# Patient Record
Sex: Female | Born: 1960 | Race: White | Hispanic: No | Marital: Single | Smoking: Never smoker
Health system: Southern US, Community
[De-identification: ages and names within clinical notes are randomized; demographics above are authoritative.]

## PROBLEM LIST (undated history)

## (undated) DIAGNOSIS — L97909 Non-pressure chronic ulcer of unspecified part of unspecified lower leg with unspecified severity: Secondary | ICD-10-CM

## (undated) DIAGNOSIS — I10 Essential (primary) hypertension: Secondary | ICD-10-CM

## (undated) DIAGNOSIS — D509 Iron deficiency anemia, unspecified: Secondary | ICD-10-CM

## (undated) DIAGNOSIS — I83009 Varicose veins of unspecified lower extremity with ulcer of unspecified site: Secondary | ICD-10-CM

## (undated) DIAGNOSIS — R053 Chronic cough: Secondary | ICD-10-CM

## (undated) DIAGNOSIS — Z8659 Personal history of other mental and behavioral disorders: Secondary | ICD-10-CM

## (undated) DIAGNOSIS — I5030 Unspecified diastolic (congestive) heart failure: Secondary | ICD-10-CM

## (undated) DIAGNOSIS — I509 Heart failure, unspecified: Secondary | ICD-10-CM

## (undated) DIAGNOSIS — G4733 Obstructive sleep apnea (adult) (pediatric): Secondary | ICD-10-CM

## (undated) DIAGNOSIS — R05 Cough: Secondary | ICD-10-CM

## (undated) DIAGNOSIS — R4189 Other symptoms and signs involving cognitive functions and awareness: Secondary | ICD-10-CM

## (undated) DIAGNOSIS — N289 Disorder of kidney and ureter, unspecified: Secondary | ICD-10-CM

## (undated) DIAGNOSIS — F329 Major depressive disorder, single episode, unspecified: Secondary | ICD-10-CM

## (undated) DIAGNOSIS — N1832 Chronic kidney disease, stage 3b: Secondary | ICD-10-CM

## (undated) DIAGNOSIS — E785 Hyperlipidemia, unspecified: Secondary | ICD-10-CM

## (undated) DIAGNOSIS — K219 Gastro-esophageal reflux disease without esophagitis: Secondary | ICD-10-CM

## (undated) DIAGNOSIS — F32A Depression, unspecified: Secondary | ICD-10-CM

## (undated) DIAGNOSIS — I272 Pulmonary hypertension, unspecified: Secondary | ICD-10-CM

## (undated) DIAGNOSIS — I2781 Cor pulmonale (chronic): Secondary | ICD-10-CM

## (undated) DIAGNOSIS — J449 Chronic obstructive pulmonary disease, unspecified: Secondary | ICD-10-CM

## (undated) DIAGNOSIS — J984 Other disorders of lung: Secondary | ICD-10-CM

## (undated) DIAGNOSIS — R0602 Shortness of breath: Secondary | ICD-10-CM

## (undated) DIAGNOSIS — B009 Herpesviral infection, unspecified: Secondary | ICD-10-CM

## (undated) HISTORY — DX: Other disorders of lung: J98.4

## (undated) HISTORY — DX: Iron deficiency anemia, unspecified: D50.9

## (undated) HISTORY — DX: Personal history of other mental and behavioral disorders: Z86.59

## (undated) HISTORY — PX: CHOLECYSTECTOMY: SHX55

## (undated) HISTORY — DX: Hyperlipidemia, unspecified: E78.5

## (undated) HISTORY — DX: Cough: R05

## (undated) HISTORY — DX: Unspecified diastolic (congestive) heart failure: I50.30

## (undated) HISTORY — DX: Chronic kidney disease, stage 3b: N18.32

## (undated) HISTORY — DX: Depression, unspecified: F32.A

## (undated) HISTORY — DX: Varicose veins of unspecified lower extremity with ulcer of unspecified site: L97.909

## (undated) HISTORY — DX: Major depressive disorder, single episode, unspecified: F32.9

## (undated) HISTORY — DX: Chronic cough: R05.3

## (undated) HISTORY — DX: Cor pulmonale (chronic): I27.81

## (undated) HISTORY — DX: Obstructive sleep apnea (adult) (pediatric): G47.33

## (undated) HISTORY — DX: Varicose veins of unspecified lower extremity with ulcer of unspecified site: I83.009

## (undated) HISTORY — DX: Gastro-esophageal reflux disease without esophagitis: K21.9

---

## 1997-04-06 ENCOUNTER — Emergency Department (HOSPITAL_COMMUNITY): Admission: EM | Admit: 1997-04-06 | Discharge: 1997-04-06 | Payer: Self-pay | Admitting: Emergency Medicine

## 1997-05-10 ENCOUNTER — Encounter: Admission: RE | Admit: 1997-05-10 | Discharge: 1997-05-10 | Payer: Self-pay | Admitting: Internal Medicine

## 1997-08-21 ENCOUNTER — Encounter: Admission: RE | Admit: 1997-08-21 | Discharge: 1997-08-21 | Payer: Self-pay | Admitting: Internal Medicine

## 1997-09-13 ENCOUNTER — Emergency Department (HOSPITAL_COMMUNITY): Admission: EM | Admit: 1997-09-13 | Discharge: 1997-09-13 | Payer: Self-pay | Admitting: Emergency Medicine

## 1997-10-16 ENCOUNTER — Encounter: Admission: RE | Admit: 1997-10-16 | Discharge: 1997-10-16 | Payer: Self-pay | Admitting: Internal Medicine

## 1997-10-16 ENCOUNTER — Ambulatory Visit (HOSPITAL_COMMUNITY): Admission: RE | Admit: 1997-10-16 | Discharge: 1997-10-16 | Payer: Self-pay | Admitting: Internal Medicine

## 1997-11-07 ENCOUNTER — Encounter: Admission: RE | Admit: 1997-11-07 | Discharge: 1997-11-07 | Payer: Self-pay | Admitting: Internal Medicine

## 1998-02-10 ENCOUNTER — Encounter: Admission: RE | Admit: 1998-02-10 | Discharge: 1998-02-10 | Payer: Self-pay | Admitting: Internal Medicine

## 1998-03-20 ENCOUNTER — Encounter: Admission: RE | Admit: 1998-03-20 | Discharge: 1998-03-20 | Payer: Self-pay | Admitting: Internal Medicine

## 1998-03-20 ENCOUNTER — Other Ambulatory Visit: Admission: RE | Admit: 1998-03-20 | Discharge: 1998-03-20 | Payer: Self-pay | Admitting: Internal Medicine

## 1998-04-18 ENCOUNTER — Emergency Department (HOSPITAL_COMMUNITY): Admission: EM | Admit: 1998-04-18 | Discharge: 1998-04-18 | Payer: Self-pay | Admitting: Emergency Medicine

## 1998-04-18 ENCOUNTER — Encounter: Payer: Self-pay | Admitting: Emergency Medicine

## 1998-05-14 ENCOUNTER — Encounter: Admission: RE | Admit: 1998-05-14 | Discharge: 1998-05-14 | Payer: Self-pay | Admitting: Internal Medicine

## 1998-06-20 ENCOUNTER — Emergency Department (HOSPITAL_COMMUNITY): Admission: EM | Admit: 1998-06-20 | Discharge: 1998-06-20 | Payer: Self-pay | Admitting: Emergency Medicine

## 1998-06-26 ENCOUNTER — Encounter: Admission: RE | Admit: 1998-06-26 | Discharge: 1998-06-26 | Payer: Self-pay | Admitting: Hematology and Oncology

## 1998-07-05 ENCOUNTER — Inpatient Hospital Stay (HOSPITAL_COMMUNITY): Admission: AD | Admit: 1998-07-05 | Discharge: 1998-07-05 | Payer: Self-pay | Admitting: *Deleted

## 1998-08-26 ENCOUNTER — Encounter: Admission: RE | Admit: 1998-08-26 | Discharge: 1998-08-26 | Payer: Self-pay | Admitting: Internal Medicine

## 1998-09-10 ENCOUNTER — Encounter: Admission: RE | Admit: 1998-09-10 | Discharge: 1998-09-10 | Payer: Self-pay | Admitting: Internal Medicine

## 1998-10-21 ENCOUNTER — Encounter: Admission: RE | Admit: 1998-10-21 | Discharge: 1998-10-21 | Payer: Self-pay | Admitting: Internal Medicine

## 1999-02-06 ENCOUNTER — Encounter: Admission: RE | Admit: 1999-02-06 | Discharge: 1999-02-06 | Payer: Self-pay | Admitting: Internal Medicine

## 1999-04-14 ENCOUNTER — Emergency Department (HOSPITAL_COMMUNITY): Admission: EM | Admit: 1999-04-14 | Discharge: 1999-04-14 | Payer: Self-pay | Admitting: Emergency Medicine

## 1999-04-14 ENCOUNTER — Encounter: Payer: Self-pay | Admitting: Emergency Medicine

## 1999-06-04 ENCOUNTER — Encounter: Admission: RE | Admit: 1999-06-04 | Discharge: 1999-06-04 | Payer: Self-pay | Admitting: Internal Medicine

## 1999-06-23 ENCOUNTER — Encounter: Admission: RE | Admit: 1999-06-23 | Discharge: 1999-06-23 | Payer: Self-pay | Admitting: Internal Medicine

## 1999-12-16 ENCOUNTER — Encounter: Admission: RE | Admit: 1999-12-16 | Discharge: 1999-12-16 | Payer: Self-pay | Admitting: Internal Medicine

## 2000-01-26 ENCOUNTER — Encounter: Admission: RE | Admit: 2000-01-26 | Discharge: 2000-01-26 | Payer: Self-pay | Admitting: Internal Medicine

## 2000-01-26 ENCOUNTER — Ambulatory Visit (HOSPITAL_COMMUNITY): Admission: RE | Admit: 2000-01-26 | Discharge: 2000-01-26 | Payer: Self-pay | Admitting: Internal Medicine

## 2000-09-13 ENCOUNTER — Other Ambulatory Visit: Admission: RE | Admit: 2000-09-13 | Discharge: 2000-09-13 | Payer: Self-pay | Admitting: Internal Medicine

## 2000-09-13 ENCOUNTER — Encounter: Admission: RE | Admit: 2000-09-13 | Discharge: 2000-09-13 | Payer: Self-pay | Admitting: Internal Medicine

## 2000-12-26 ENCOUNTER — Emergency Department (HOSPITAL_COMMUNITY): Admission: EM | Admit: 2000-12-26 | Discharge: 2000-12-26 | Payer: Self-pay | Admitting: Emergency Medicine

## 2001-01-11 ENCOUNTER — Encounter: Admission: RE | Admit: 2001-01-11 | Discharge: 2001-01-11 | Payer: Self-pay

## 2001-03-14 ENCOUNTER — Encounter: Admission: RE | Admit: 2001-03-14 | Discharge: 2001-03-14 | Payer: Self-pay | Admitting: Internal Medicine

## 2001-03-20 ENCOUNTER — Encounter: Admission: RE | Admit: 2001-03-20 | Discharge: 2001-03-20 | Payer: Self-pay | Admitting: Internal Medicine

## 2001-05-23 ENCOUNTER — Encounter: Admission: RE | Admit: 2001-05-23 | Discharge: 2001-05-23 | Payer: Self-pay | Admitting: Internal Medicine

## 2001-08-01 ENCOUNTER — Encounter: Admission: RE | Admit: 2001-08-01 | Discharge: 2001-08-01 | Payer: Self-pay | Admitting: Internal Medicine

## 2001-10-04 ENCOUNTER — Inpatient Hospital Stay (HOSPITAL_COMMUNITY): Admission: AD | Admit: 2001-10-04 | Discharge: 2001-10-04 | Payer: Self-pay | Admitting: Obstetrics and Gynecology

## 2001-12-04 ENCOUNTER — Encounter: Admission: RE | Admit: 2001-12-04 | Discharge: 2001-12-04 | Payer: Self-pay | Admitting: Internal Medicine

## 2001-12-05 ENCOUNTER — Encounter: Admission: RE | Admit: 2001-12-05 | Discharge: 2001-12-05 | Payer: Self-pay | Admitting: Internal Medicine

## 2001-12-19 ENCOUNTER — Encounter: Admission: RE | Admit: 2001-12-19 | Discharge: 2001-12-19 | Payer: Self-pay | Admitting: Internal Medicine

## 2002-02-07 ENCOUNTER — Encounter: Admission: RE | Admit: 2002-02-07 | Discharge: 2002-02-07 | Payer: Self-pay | Admitting: Internal Medicine

## 2002-02-28 ENCOUNTER — Encounter: Admission: RE | Admit: 2002-02-28 | Discharge: 2002-02-28 | Payer: Self-pay | Admitting: Internal Medicine

## 2002-05-08 ENCOUNTER — Encounter: Admission: RE | Admit: 2002-05-08 | Discharge: 2002-05-08 | Payer: Self-pay | Admitting: Internal Medicine

## 2002-06-19 ENCOUNTER — Encounter: Admission: RE | Admit: 2002-06-19 | Discharge: 2002-06-19 | Payer: Self-pay | Admitting: Internal Medicine

## 2002-06-22 ENCOUNTER — Emergency Department (HOSPITAL_COMMUNITY): Admission: EM | Admit: 2002-06-22 | Discharge: 2002-06-22 | Payer: Self-pay | Admitting: Emergency Medicine

## 2002-06-22 ENCOUNTER — Encounter: Payer: Self-pay | Admitting: Emergency Medicine

## 2002-08-21 ENCOUNTER — Encounter: Admission: RE | Admit: 2002-08-21 | Discharge: 2002-08-21 | Payer: Self-pay | Admitting: Internal Medicine

## 2002-10-23 ENCOUNTER — Other Ambulatory Visit: Admission: RE | Admit: 2002-10-23 | Discharge: 2002-10-23 | Payer: Self-pay | Admitting: Internal Medicine

## 2002-10-23 ENCOUNTER — Encounter: Admission: RE | Admit: 2002-10-23 | Discharge: 2002-10-23 | Payer: Self-pay | Admitting: Internal Medicine

## 2002-12-18 ENCOUNTER — Encounter: Admission: RE | Admit: 2002-12-18 | Discharge: 2002-12-18 | Payer: Self-pay | Admitting: Internal Medicine

## 2002-12-25 ENCOUNTER — Ambulatory Visit (HOSPITAL_COMMUNITY): Admission: RE | Admit: 2002-12-25 | Discharge: 2002-12-25 | Payer: Self-pay | Admitting: Internal Medicine

## 2003-01-15 ENCOUNTER — Encounter: Admission: RE | Admit: 2003-01-15 | Discharge: 2003-01-15 | Payer: Self-pay | Admitting: Internal Medicine

## 2003-02-14 ENCOUNTER — Encounter: Admission: RE | Admit: 2003-02-14 | Discharge: 2003-02-14 | Payer: Self-pay | Admitting: Internal Medicine

## 2003-03-14 ENCOUNTER — Encounter: Admission: RE | Admit: 2003-03-14 | Discharge: 2003-03-14 | Payer: Self-pay | Admitting: Internal Medicine

## 2003-04-30 ENCOUNTER — Encounter: Admission: RE | Admit: 2003-04-30 | Discharge: 2003-04-30 | Payer: Self-pay | Admitting: Internal Medicine

## 2003-05-28 ENCOUNTER — Encounter: Admission: RE | Admit: 2003-05-28 | Discharge: 2003-05-28 | Payer: Self-pay | Admitting: Internal Medicine

## 2003-06-27 ENCOUNTER — Encounter: Admission: RE | Admit: 2003-06-27 | Discharge: 2003-06-27 | Payer: Self-pay | Admitting: Internal Medicine

## 2003-07-26 ENCOUNTER — Encounter: Admission: RE | Admit: 2003-07-26 | Discharge: 2003-07-26 | Payer: Self-pay | Admitting: Internal Medicine

## 2003-08-26 ENCOUNTER — Encounter: Admission: RE | Admit: 2003-08-26 | Discharge: 2003-08-26 | Payer: Self-pay | Admitting: Internal Medicine

## 2003-09-26 ENCOUNTER — Ambulatory Visit: Payer: Self-pay | Admitting: Internal Medicine

## 2003-10-28 ENCOUNTER — Ambulatory Visit: Payer: Self-pay | Admitting: Internal Medicine

## 2003-12-05 ENCOUNTER — Ambulatory Visit: Payer: Self-pay | Admitting: Internal Medicine

## 2004-01-14 ENCOUNTER — Ambulatory Visit: Payer: Self-pay | Admitting: Internal Medicine

## 2004-01-28 ENCOUNTER — Ambulatory Visit (HOSPITAL_COMMUNITY): Admission: RE | Admit: 2004-01-28 | Discharge: 2004-01-28 | Payer: Self-pay | Admitting: Internal Medicine

## 2004-02-11 ENCOUNTER — Ambulatory Visit: Payer: Self-pay | Admitting: Internal Medicine

## 2004-03-31 ENCOUNTER — Ambulatory Visit: Payer: Self-pay | Admitting: Internal Medicine

## 2004-03-31 ENCOUNTER — Other Ambulatory Visit: Admission: RE | Admit: 2004-03-31 | Discharge: 2004-03-31 | Payer: Self-pay | Admitting: Internal Medicine

## 2004-04-30 ENCOUNTER — Ambulatory Visit: Payer: Self-pay | Admitting: Internal Medicine

## 2004-05-19 ENCOUNTER — Inpatient Hospital Stay (HOSPITAL_COMMUNITY): Admission: AD | Admit: 2004-05-19 | Discharge: 2004-05-19 | Payer: Self-pay | Admitting: *Deleted

## 2004-05-28 ENCOUNTER — Ambulatory Visit: Payer: Self-pay | Admitting: Internal Medicine

## 2004-06-29 ENCOUNTER — Ambulatory Visit: Payer: Self-pay | Admitting: Internal Medicine

## 2004-07-29 ENCOUNTER — Ambulatory Visit: Payer: Self-pay | Admitting: Internal Medicine

## 2004-08-28 ENCOUNTER — Ambulatory Visit: Payer: Self-pay | Admitting: Internal Medicine

## 2004-09-29 ENCOUNTER — Ambulatory Visit: Payer: Self-pay | Admitting: Internal Medicine

## 2004-10-27 ENCOUNTER — Ambulatory Visit: Payer: Self-pay | Admitting: Internal Medicine

## 2004-12-01 ENCOUNTER — Ambulatory Visit: Payer: Self-pay | Admitting: Internal Medicine

## 2004-12-31 ENCOUNTER — Ambulatory Visit: Payer: Self-pay | Admitting: Internal Medicine

## 2005-01-28 ENCOUNTER — Ambulatory Visit (HOSPITAL_COMMUNITY): Admission: RE | Admit: 2005-01-28 | Discharge: 2005-01-28 | Payer: Self-pay | Admitting: Internal Medicine

## 2005-02-01 ENCOUNTER — Ambulatory Visit: Payer: Self-pay | Admitting: Internal Medicine

## 2005-02-23 ENCOUNTER — Ambulatory Visit: Payer: Self-pay | Admitting: Internal Medicine

## 2005-06-04 ENCOUNTER — Ambulatory Visit: Payer: Self-pay | Admitting: Internal Medicine

## 2005-06-22 ENCOUNTER — Ambulatory Visit: Payer: Self-pay | Admitting: Internal Medicine

## 2005-06-24 ENCOUNTER — Ambulatory Visit (HOSPITAL_COMMUNITY): Admission: RE | Admit: 2005-06-24 | Discharge: 2005-06-24 | Payer: Self-pay | Admitting: Internal Medicine

## 2005-06-25 ENCOUNTER — Encounter (INDEPENDENT_AMBULATORY_CARE_PROVIDER_SITE_OTHER): Payer: Self-pay | Admitting: Internal Medicine

## 2005-07-22 ENCOUNTER — Ambulatory Visit: Payer: Self-pay | Admitting: Internal Medicine

## 2005-08-31 ENCOUNTER — Ambulatory Visit: Payer: Self-pay | Admitting: Hospitalist

## 2005-09-30 ENCOUNTER — Ambulatory Visit: Payer: Self-pay | Admitting: Hospitalist

## 2005-10-19 DIAGNOSIS — F79 Unspecified intellectual disabilities: Secondary | ICD-10-CM

## 2005-10-19 DIAGNOSIS — D518 Other vitamin B12 deficiency anemias: Secondary | ICD-10-CM

## 2005-11-01 ENCOUNTER — Ambulatory Visit: Payer: Self-pay | Admitting: Internal Medicine

## 2005-11-29 DIAGNOSIS — K219 Gastro-esophageal reflux disease without esophagitis: Secondary | ICD-10-CM | POA: Insufficient documentation

## 2005-12-03 ENCOUNTER — Ambulatory Visit: Payer: Self-pay | Admitting: Internal Medicine

## 2005-12-21 ENCOUNTER — Ambulatory Visit: Payer: Self-pay | Admitting: Internal Medicine

## 2005-12-21 LAB — CONVERTED CEMR LAB
HCT: 42.5 % (ref 34.4–43.3)
MCHC: 31.3 g/dL — ABNORMAL LOW (ref 33.1–35.4)
RBC: 4.74 M/uL (ref 3.79–4.96)
WBC: 13 10*3/uL — ABNORMAL HIGH (ref 3.7–10.0)

## 2006-02-07 ENCOUNTER — Ambulatory Visit (HOSPITAL_COMMUNITY): Admission: RE | Admit: 2006-02-07 | Discharge: 2006-02-07 | Payer: Self-pay | Admitting: Internal Medicine

## 2006-02-09 ENCOUNTER — Ambulatory Visit: Payer: Self-pay | Admitting: Internal Medicine

## 2006-04-26 ENCOUNTER — Ambulatory Visit: Payer: Self-pay | Admitting: Internal Medicine

## 2006-05-04 ENCOUNTER — Telehealth (INDEPENDENT_AMBULATORY_CARE_PROVIDER_SITE_OTHER): Payer: Self-pay | Admitting: *Deleted

## 2006-06-28 ENCOUNTER — Ambulatory Visit: Payer: Self-pay | Admitting: Internal Medicine

## 2006-06-28 ENCOUNTER — Encounter (INDEPENDENT_AMBULATORY_CARE_PROVIDER_SITE_OTHER): Payer: Self-pay | Admitting: Internal Medicine

## 2006-06-28 LAB — CONVERTED CEMR LAB
BUN: 10 mg/dL (ref 6–23)
Basophils Absolute: 0 10*3/uL (ref 0.0–0.1)
Basophils Relative: 0 % (ref 0–1)
Creatinine, Ser: 0.69 mg/dL (ref 0.40–1.20)
Eosinophils Relative: 0 % (ref 0–5)
Glucose, Bld: 82 mg/dL (ref 70–99)
Lymphocytes Relative: 27 % (ref 12–46)
MCHC: 30.7 g/dL (ref 30.0–36.0)
MCV: 84.5 fL (ref 78.0–100.0)
Neutro Abs: 7.4 10*3/uL (ref 1.7–7.7)
Neutrophils Relative %: 62 % (ref 43–77)
Potassium: 4.2 meq/L (ref 3.5–5.3)
RDW: 17.8 % — ABNORMAL HIGH (ref 11.5–14.0)
Sodium: 139 meq/L (ref 135–145)
WBC: 11.8 10*3/uL — ABNORMAL HIGH (ref 4.0–10.5)

## 2006-08-10 ENCOUNTER — Ambulatory Visit: Payer: Self-pay | Admitting: Internal Medicine

## 2006-10-10 ENCOUNTER — Ambulatory Visit: Payer: Self-pay | Admitting: *Deleted

## 2006-11-11 ENCOUNTER — Ambulatory Visit: Payer: Self-pay | Admitting: Internal Medicine

## 2007-03-14 ENCOUNTER — Ambulatory Visit: Payer: Self-pay | Admitting: Internal Medicine

## 2007-03-14 LAB — CONVERTED CEMR LAB
ALT: 20 units/L (ref 0–35)
Albumin: 4 g/dL (ref 3.5–5.2)
BUN: 12 mg/dL (ref 6–23)
Calcium: 8.9 mg/dL (ref 8.4–10.5)
Chloride: 101 meq/L (ref 96–112)
HDL: 60 mg/dL (ref >39)
MCHC: 31 g/dL (ref 30.0–36.0)
MCV: 81.1 fL (ref 78.0–100.0)
Potassium: 4 meq/L (ref 3.5–5.3)
RBC: 4.81 M/uL (ref 3.87–5.11)
RDW: 18.3 % — ABNORMAL HIGH (ref 11.5–15.5)
Total Bilirubin: 0.3 mg/dL (ref 0.3–1.2)
Total CHOL/HDL Ratio: 3.3
VLDL: 26 mg/dL (ref 0–40)
Vitamin B-12: >2000 pg/mL — ABNORMAL HIGH (ref 211–911)
WBC: 17.9 10*3/uL — ABNORMAL HIGH (ref 4.0–10.5)

## 2007-03-15 ENCOUNTER — Telehealth: Payer: Self-pay | Admitting: *Deleted

## 2007-03-23 ENCOUNTER — Ambulatory Visit (HOSPITAL_COMMUNITY): Admission: RE | Admit: 2007-03-23 | Discharge: 2007-03-23 | Payer: Self-pay | Admitting: Internal Medicine

## 2007-05-03 ENCOUNTER — Encounter (INDEPENDENT_AMBULATORY_CARE_PROVIDER_SITE_OTHER): Payer: Self-pay | Admitting: Internal Medicine

## 2007-06-20 ENCOUNTER — Ambulatory Visit: Payer: Self-pay | Admitting: Internal Medicine

## 2007-08-07 ENCOUNTER — Telehealth: Payer: Self-pay | Admitting: *Deleted

## 2007-08-07 ENCOUNTER — Ambulatory Visit: Payer: Self-pay | Admitting: Infectious Diseases

## 2007-09-28 ENCOUNTER — Encounter (INDEPENDENT_AMBULATORY_CARE_PROVIDER_SITE_OTHER): Payer: Self-pay | Admitting: Internal Medicine

## 2007-09-28 ENCOUNTER — Ambulatory Visit: Payer: Self-pay | Admitting: Internal Medicine

## 2007-10-30 ENCOUNTER — Ambulatory Visit: Payer: Self-pay | Admitting: Internal Medicine

## 2007-10-30 ENCOUNTER — Encounter (INDEPENDENT_AMBULATORY_CARE_PROVIDER_SITE_OTHER): Payer: Self-pay | Admitting: Internal Medicine

## 2007-11-14 ENCOUNTER — Encounter (INDEPENDENT_AMBULATORY_CARE_PROVIDER_SITE_OTHER): Payer: Self-pay | Admitting: Internal Medicine

## 2007-11-14 ENCOUNTER — Ambulatory Visit: Payer: Self-pay | Admitting: Internal Medicine

## 2007-11-14 LAB — CONVERTED CEMR LAB
Alkaline Phosphatase: 100 units/L (ref 39–117)
Basophils Absolute: 0 10*3/uL (ref 0.0–0.1)
Bilirubin Urine: NEGATIVE
Calcium: 9.2 mg/dL (ref 8.4–10.5)
Chloride: 98 meq/L (ref 96–112)
Creatinine, Ser: 0.61 mg/dL (ref 0.40–1.20)
Glucose, Bld: 91 mg/dL (ref 70–99)
HCT: 37 % (ref 36.0–46.0)
Hemoglobin, Urine: NEGATIVE
Hemoglobin: 10.8 g/dL — ABNORMAL LOW (ref 12.0–15.0)
Ketones, ur: NEGATIVE mg/dL
Leukocytes, UA: NEGATIVE
MCHC: 29.2 g/dL — ABNORMAL LOW (ref 30.0–36.0)
MCV: 73.4 fL — ABNORMAL LOW (ref 78.0–100.0)
Monocytes Absolute: 1.2 10*3/uL — ABNORMAL HIGH (ref 0.1–1.0)
Neutro Abs: 11.3 10*3/uL — ABNORMAL HIGH (ref 1.7–7.7)
Neutrophils Relative %: 74 % (ref 43–77)
Platelets: 690 10*3/uL — ABNORMAL HIGH (ref 150–400)
Protein, ur: NEGATIVE mg/dL
RDW: 20.2 % — ABNORMAL HIGH (ref 11.5–15.5)
Sodium: 139 meq/L (ref 135–145)
Total Bilirubin: 0.3 mg/dL (ref 0.3–1.2)
Urine Glucose: NEGATIVE mg/dL
pH: 6 (ref 5.0–8.0)

## 2007-11-29 ENCOUNTER — Ambulatory Visit: Payer: Self-pay | Admitting: *Deleted

## 2007-12-13 ENCOUNTER — Telehealth: Payer: Self-pay | Admitting: *Deleted

## 2008-01-03 ENCOUNTER — Emergency Department (HOSPITAL_COMMUNITY): Admission: EM | Admit: 2008-01-03 | Discharge: 2008-01-03 | Payer: Self-pay | Admitting: Emergency Medicine

## 2008-01-03 ENCOUNTER — Telehealth: Payer: Self-pay | Admitting: Infectious Diseases

## 2008-01-09 ENCOUNTER — Ambulatory Visit: Payer: Self-pay | Admitting: Internal Medicine

## 2008-02-08 ENCOUNTER — Ambulatory Visit: Payer: Self-pay | Admitting: Internal Medicine

## 2008-02-08 LAB — CONVERTED CEMR LAB: Beta hcg, urine, semiquantitative: NEGATIVE

## 2008-02-09 LAB — CONVERTED CEMR LAB
Basophils Absolute: 0 10*3/uL (ref 0.0–0.1)
Basophils Relative: 0 % (ref 0–1)
Ferritin: 27 ng/mL (ref 10–291)
HCT: 38.5 % (ref 36.0–46.0)
Hemoglobin: 10.8 g/dL — ABNORMAL LOW (ref 12.0–15.0)
Lymphocytes Relative: 20 % (ref 12–46)
MCHC: 28.1 g/dL — ABNORMAL LOW (ref 30.0–36.0)
MCV: 73.6 fL — ABNORMAL LOW (ref 78.0–100.0)
Monocytes Relative: 7 % (ref 3–12)
Platelets: 640 10*3/uL — ABNORMAL HIGH (ref 150–400)
RBC: 5.23 M/uL — ABNORMAL HIGH (ref 3.87–5.11)
RDW: 22.1 % — ABNORMAL HIGH (ref 11.5–15.5)
TSH: 3.239 microintl units/mL (ref 0.350–4.50)

## 2008-02-26 ENCOUNTER — Ambulatory Visit: Payer: Self-pay | Admitting: Internal Medicine

## 2008-02-26 LAB — CONVERTED CEMR LAB
OCCULT 1: NEGATIVE
OCCULT 2: NEGATIVE

## 2008-03-11 ENCOUNTER — Ambulatory Visit: Payer: Self-pay | Admitting: *Deleted

## 2008-03-20 ENCOUNTER — Inpatient Hospital Stay (HOSPITAL_COMMUNITY): Admission: EM | Admit: 2008-03-20 | Discharge: 2008-03-25 | Payer: Self-pay | Admitting: Emergency Medicine

## 2008-03-20 ENCOUNTER — Encounter: Payer: Self-pay | Admitting: *Deleted

## 2008-03-20 ENCOUNTER — Ambulatory Visit: Payer: Self-pay | Admitting: Internal Medicine

## 2008-03-21 ENCOUNTER — Ambulatory Visit: Payer: Self-pay | Admitting: Vascular Surgery

## 2008-03-21 ENCOUNTER — Encounter (INDEPENDENT_AMBULATORY_CARE_PROVIDER_SITE_OTHER): Payer: Self-pay | Admitting: Internal Medicine

## 2008-03-21 ENCOUNTER — Encounter (INDEPENDENT_AMBULATORY_CARE_PROVIDER_SITE_OTHER): Payer: Self-pay | Admitting: *Deleted

## 2008-03-25 ENCOUNTER — Encounter (INDEPENDENT_AMBULATORY_CARE_PROVIDER_SITE_OTHER): Payer: Self-pay | Admitting: Internal Medicine

## 2008-04-10 ENCOUNTER — Ambulatory Visit: Payer: Self-pay | Admitting: Internal Medicine

## 2008-04-17 ENCOUNTER — Ambulatory Visit (HOSPITAL_COMMUNITY): Admission: RE | Admit: 2008-04-17 | Discharge: 2008-04-17 | Payer: Self-pay | Admitting: Ophthalmology

## 2008-04-17 ENCOUNTER — Encounter (INDEPENDENT_AMBULATORY_CARE_PROVIDER_SITE_OTHER): Payer: Self-pay | Admitting: Internal Medicine

## 2008-05-14 ENCOUNTER — Ambulatory Visit: Payer: Self-pay | Admitting: *Deleted

## 2008-05-28 ENCOUNTER — Emergency Department (HOSPITAL_COMMUNITY): Admission: EM | Admit: 2008-05-28 | Discharge: 2008-05-28 | Payer: Self-pay | Admitting: Emergency Medicine

## 2008-05-29 ENCOUNTER — Telehealth: Payer: Self-pay | Admitting: *Deleted

## 2008-05-30 ENCOUNTER — Encounter (INDEPENDENT_AMBULATORY_CARE_PROVIDER_SITE_OTHER): Payer: Self-pay | Admitting: *Deleted

## 2008-05-30 ENCOUNTER — Ambulatory Visit: Payer: Self-pay | Admitting: *Deleted

## 2008-05-30 LAB — CONVERTED CEMR LAB
AST: 27 units/L (ref 0–37)
Albumin: 3.9 g/dL (ref 3.5–5.2)
Alkaline Phosphatase: 96 units/L (ref 39–117)
BUN: 8 mg/dL (ref 6–23)
Chloride: 101 meq/L (ref 96–112)
GFR calc Af Amer: >60 mL/min (ref >60)
GFR calc non Af Amer: >60 mL/min (ref >60)
Total Protein: 7.4 g/dL (ref 6.0–8.3)

## 2008-06-14 ENCOUNTER — Encounter (INDEPENDENT_AMBULATORY_CARE_PROVIDER_SITE_OTHER): Payer: Self-pay | Admitting: *Deleted

## 2008-06-14 ENCOUNTER — Ambulatory Visit: Payer: Self-pay | Admitting: Internal Medicine

## 2008-06-16 LAB — CONVERTED CEMR LAB
Calcium: 9.1 mg/dL (ref 8.4–10.5)
Chloride: 98 meq/L (ref 96–112)
Potassium: 4.1 meq/L (ref 3.5–5.3)
Total Protein: 7.1 g/dL (ref 6.0–8.3)

## 2008-06-27 ENCOUNTER — Telehealth: Payer: Self-pay | Admitting: *Deleted

## 2008-07-15 ENCOUNTER — Ambulatory Visit: Payer: Self-pay | Admitting: Internal Medicine

## 2008-07-15 ENCOUNTER — Encounter: Payer: Self-pay | Admitting: Internal Medicine

## 2008-07-15 DIAGNOSIS — I83009 Varicose veins of unspecified lower extremity with ulcer of unspecified site: Secondary | ICD-10-CM

## 2008-07-15 DIAGNOSIS — L97909 Non-pressure chronic ulcer of unspecified part of unspecified lower leg with unspecified severity: Secondary | ICD-10-CM | POA: Insufficient documentation

## 2008-07-18 LAB — CONVERTED CEMR LAB
Calcium: 9.1 mg/dL (ref 8.4–10.5)
Chloride: 100 meq/L (ref 96–112)
Potassium: 4.7 meq/L (ref 3.5–5.3)
Sodium: 141 meq/L (ref 135–145)

## 2008-07-22 ENCOUNTER — Encounter (INDEPENDENT_AMBULATORY_CARE_PROVIDER_SITE_OTHER): Payer: Self-pay | Admitting: Internal Medicine

## 2008-07-22 ENCOUNTER — Ambulatory Visit: Payer: Self-pay | Admitting: Infectious Diseases

## 2008-07-24 LAB — CONVERTED CEMR LAB
HCT: 39.6 % (ref 36.0–46.0)
Hemoglobin: 12.5 g/dL (ref 12.0–15.0)
LDL Cholesterol: 125 mg/dL — ABNORMAL HIGH (ref 0–99)
MCHC: 31.7 g/dL (ref 30.0–36.0)
MCV: 85.8 fL (ref 78.0–100.0)
RDW: 18.1 % — ABNORMAL HIGH (ref 11.5–15.5)
Total CHOL/HDL Ratio: 4.2
WBC: 11.4 10*3/uL — ABNORMAL HIGH (ref 4.0–10.5)

## 2008-08-08 ENCOUNTER — Encounter (INDEPENDENT_AMBULATORY_CARE_PROVIDER_SITE_OTHER): Payer: Self-pay | Admitting: Internal Medicine

## 2008-08-08 ENCOUNTER — Ambulatory Visit: Payer: Self-pay | Admitting: Internal Medicine

## 2008-08-13 ENCOUNTER — Ambulatory Visit: Payer: Self-pay | Admitting: Internal Medicine

## 2008-08-14 ENCOUNTER — Encounter (HOSPITAL_BASED_OUTPATIENT_CLINIC_OR_DEPARTMENT_OTHER): Admission: RE | Admit: 2008-08-14 | Discharge: 2008-09-11 | Payer: Self-pay | Admitting: Internal Medicine

## 2008-08-14 ENCOUNTER — Ambulatory Visit: Payer: Self-pay | Admitting: Internal Medicine

## 2008-09-04 ENCOUNTER — Inpatient Hospital Stay (HOSPITAL_COMMUNITY): Admission: AD | Admit: 2008-09-04 | Discharge: 2008-09-04 | Payer: Self-pay | Admitting: Obstetrics & Gynecology

## 2008-09-13 ENCOUNTER — Ambulatory Visit: Payer: Self-pay | Admitting: Infectious Diseases

## 2008-09-17 ENCOUNTER — Ambulatory Visit: Payer: Self-pay | Admitting: Internal Medicine

## 2008-09-23 ENCOUNTER — Telehealth (INDEPENDENT_AMBULATORY_CARE_PROVIDER_SITE_OTHER): Payer: Self-pay | Admitting: Internal Medicine

## 2008-09-27 ENCOUNTER — Telehealth: Payer: Self-pay | Admitting: *Deleted

## 2008-10-11 ENCOUNTER — Ambulatory Visit: Payer: Self-pay | Admitting: Internal Medicine

## 2008-10-15 ENCOUNTER — Ambulatory Visit: Payer: Self-pay | Admitting: Internal Medicine

## 2008-10-21 ENCOUNTER — Ambulatory Visit: Payer: Self-pay | Admitting: Internal Medicine

## 2008-11-12 ENCOUNTER — Ambulatory Visit: Payer: Self-pay | Admitting: Infectious Disease

## 2008-11-19 ENCOUNTER — Telehealth (INDEPENDENT_AMBULATORY_CARE_PROVIDER_SITE_OTHER): Payer: Self-pay | Admitting: Internal Medicine

## 2008-11-26 ENCOUNTER — Ambulatory Visit (HOSPITAL_BASED_OUTPATIENT_CLINIC_OR_DEPARTMENT_OTHER): Admission: RE | Admit: 2008-11-26 | Discharge: 2008-11-26 | Payer: Self-pay | Admitting: Internal Medicine

## 2008-11-26 ENCOUNTER — Encounter (INDEPENDENT_AMBULATORY_CARE_PROVIDER_SITE_OTHER): Payer: Self-pay | Admitting: Internal Medicine

## 2008-11-30 ENCOUNTER — Ambulatory Visit: Payer: Self-pay | Admitting: Internal Medicine

## 2008-12-16 ENCOUNTER — Telehealth (INDEPENDENT_AMBULATORY_CARE_PROVIDER_SITE_OTHER): Payer: Self-pay | Admitting: Internal Medicine

## 2008-12-17 ENCOUNTER — Telehealth (INDEPENDENT_AMBULATORY_CARE_PROVIDER_SITE_OTHER): Payer: Self-pay | Admitting: Internal Medicine

## 2008-12-17 ENCOUNTER — Ambulatory Visit: Payer: Self-pay | Admitting: Internal Medicine

## 2009-01-20 ENCOUNTER — Telehealth (INDEPENDENT_AMBULATORY_CARE_PROVIDER_SITE_OTHER): Payer: Self-pay | Admitting: Internal Medicine

## 2009-01-21 ENCOUNTER — Ambulatory Visit: Payer: Self-pay | Admitting: Internal Medicine

## 2009-02-11 ENCOUNTER — Ambulatory Visit: Payer: Self-pay | Admitting: Internal Medicine

## 2009-02-21 ENCOUNTER — Ambulatory Visit: Payer: Self-pay | Admitting: Internal Medicine

## 2009-02-21 DIAGNOSIS — G4733 Obstructive sleep apnea (adult) (pediatric): Secondary | ICD-10-CM

## 2009-02-25 ENCOUNTER — Encounter (INDEPENDENT_AMBULATORY_CARE_PROVIDER_SITE_OTHER): Payer: Self-pay | Admitting: Internal Medicine

## 2009-02-28 ENCOUNTER — Ambulatory Visit (HOSPITAL_COMMUNITY): Admission: RE | Admit: 2009-02-28 | Discharge: 2009-02-28 | Payer: Self-pay | Admitting: Internal Medicine

## 2009-03-11 ENCOUNTER — Ambulatory Visit: Payer: Self-pay | Admitting: Internal Medicine

## 2009-03-21 ENCOUNTER — Encounter (INDEPENDENT_AMBULATORY_CARE_PROVIDER_SITE_OTHER): Payer: Self-pay | Admitting: Internal Medicine

## 2009-04-22 ENCOUNTER — Ambulatory Visit: Payer: Self-pay | Admitting: Internal Medicine

## 2009-04-24 ENCOUNTER — Telehealth: Payer: Self-pay | Admitting: *Deleted

## 2009-04-24 LAB — CONVERTED CEMR LAB
HCT: 44.3 % (ref 36.0–46.0)
Hemoglobin: 13.3 g/dL (ref 12.0–15.0)
MCV: 91.7 fL (ref >=78.0)
RBC: 4.83 M/uL (ref 3.87–5.11)
Vitamin B-12: >2000 pg/mL — ABNORMAL HIGH (ref 211–911)
WBC: 11.4 10*3/uL — ABNORMAL HIGH (ref 4.0–10.5)

## 2009-04-28 ENCOUNTER — Telehealth (INDEPENDENT_AMBULATORY_CARE_PROVIDER_SITE_OTHER): Payer: Self-pay | Admitting: Internal Medicine

## 2009-09-06 ENCOUNTER — Emergency Department (HOSPITAL_COMMUNITY): Admission: EM | Admit: 2009-09-06 | Discharge: 2009-09-06 | Payer: Self-pay | Admitting: Emergency Medicine

## 2009-09-09 ENCOUNTER — Telehealth: Payer: Self-pay | Admitting: Internal Medicine

## 2009-10-30 ENCOUNTER — Ambulatory Visit: Payer: Self-pay | Admitting: Internal Medicine

## 2009-10-31 LAB — CONVERTED CEMR LAB
HCT: 42.7 % (ref 36.0–46.0)
Hemoglobin: 13.3 g/dL (ref 12.0–15.0)
MCHC: 31.1 g/dL (ref 30.0–36.0)
MCV: 91.8 fL (ref >=78.0)
Platelets: 456 10*3/uL — ABNORMAL HIGH (ref 150–400)
RBC: 4.65 M/uL (ref 3.87–5.11)
RDW: 17.3 % — ABNORMAL HIGH (ref 11.5–15.5)
Vitamin B-12: 288 pg/mL (ref 211–911)
WBC: 11.6 10*3/uL — ABNORMAL HIGH (ref 4.0–10.5)

## 2009-11-03 ENCOUNTER — Telehealth: Payer: Self-pay | Admitting: Internal Medicine

## 2009-11-11 ENCOUNTER — Encounter: Payer: Self-pay | Admitting: Internal Medicine

## 2009-12-16 ENCOUNTER — Encounter: Payer: Self-pay | Admitting: Internal Medicine

## 2010-01-13 ENCOUNTER — Encounter: Payer: Self-pay | Admitting: Internal Medicine

## 2010-01-15 ENCOUNTER — Encounter: Payer: Self-pay | Admitting: Internal Medicine

## 2010-02-05 NOTE — Miscellaneous (Signed)
Summary: [facility]: Pap Device  [facility]: Pap Device   Imported By: Bonner Puna 03/26/2009 16:46:28  _____________________________________________________________________  External Attachment:    Type:   Image     Comment:   External Document

## 2010-02-05 NOTE — Assessment & Plan Note (Signed)
Summary: EXTREME M/O CLASS/CH   Allergies: 1)  ! Aspirin (Aspirin) 2)  ! Sulfa 3)  ! Codeine Patient attended a 1 hour Extreme Makeover: Lifestyle meeting today. The meeting included information about: cooking- a food demo, healthy eating (portion control, lower and healthy fat,  high fiber and more fruits and vegetables), weight loss tips, and how to start a walking program and how to use a pedometer to track walking progress .   Please ask patient what they learned at their next visit.   Thank you for the referral.

## 2010-02-05 NOTE — Progress Notes (Signed)
Summary: refill/ hla  Phone Note Refill Request Message from:  Fax from Pharmacy on January 20, 2009 4:44 PM  Refills Requested: Medication #1:  TESSALON PERLES 100 MG CAPS Take 1 tablet by mouth three times a day as needed   Last Refilled: 01/05/2009 Initial call taken by: Freddy Finner RN,  January 20, 2009 4:44 PM    Prescriptions: TESSALON PERLES 100 MG CAPS (BENZONATATE) Take 1 tablet by mouth three times a day as needed  #30 x 3   Entered and Authorized by:   Myrtis Ser MD   Signed by:   Myrtis Ser MD on 01/21/2009   Method used:   Electronically to        [facility] [phone]* (retail)       [address]       [place],   [address]       Ph: [phone]       Fax: [phone]   RxID:   [id number]

## 2010-02-05 NOTE — Progress Notes (Signed)
Summary: Refill/gh  Phone Note Refill Request Message from:  Fax from Pharmacy on April 28, 2009 11:48 AM  Refills Requested: Medication #1:  ALLEGRA-D 12 HOUR 60-120 MG  XR12H-TAB Take 1 tablet by mouth two times a day   Last Refilled: 04/11/2009  Method Requested: Electronic Initial call taken by: Sander Nephew RN,  April 28, 2009 11:48 AM    Prescriptions: ALLEGRA-D 12 HOUR 60-120 MG  XR12H-TAB (FEXOFENADINE-PSEUDOEPHEDRINE) Take 1 tablet by mouth two times a day  #60 x 3   Entered and Authorized by:   Myrtis Ser MD   Signed by:   Myrtis Ser MD on 04/29/2009   Method used:   Electronically to        [facility] [phone]* (retail)       [address]       [place], [place]  [address]       Ph: [phone]       Fax: [phone]   RxID:   [id number]   Appended Document: Refill/gh Fax from [facility] on back order. Sandrea Hammond, RN April 30, 2009 9:25 AM  Appended Document: Refill/gh She was aware of this at her last appointment and was going to try claritin instead.

## 2010-02-05 NOTE — Assessment & Plan Note (Signed)
Summary: EXTREME M/O CLASS/CH   Allergies: 1)  ! Aspirin (Aspirin) 2)  ! Sulfa 3)  ! Codeine Patient attended a 1 hour Extreme Makeover: Lifestyle meeting today. The meeting included information about: cooking- a food demo, healthy eating (portion control, lower and healthy fat,  high fiber and more fruits and vegetables), weight loss tips, moving our body more.   Please ask patient what they learned at their next visit.   Thank you for the referral.    Complete Medication List: 1)  Cyanocobalamin 1000 Mcg/ml Inj Soln (Cyanocobalamin) .... Inject 1022mg im every  month 2)  Omeprazole 20 Mg Cpdr (Omeprazole) .... Take 1 tablet by mouth two times a day 3)  Valtrex 500 Mg Tabs (Valacyclovir hcl) .... Take 1 tablet by mouth two times a day x5 days as needed 4)  Multivitamins Tabs (Multiple vitamin) .... Iron, calcium, folic acid 5)  Ferrous Gluconate Tabs (Ferrous gluconate tabs) .... Take 1 tablet by mouth once a day 6)  Allegra-d 12 Hour 60-120 Mg Xr12h-tab (Fexofenadine-pseudoephedrine) .... Take 1 tablet by mouth two times a day 7)  Monistat 7 2 % Crea (Miconazole nitrate) .... Apply to affected areas two times a day for 7 days. 8)  Tessalon Perles 100 Mg Caps (Benzonatate) .... Take 1 tablet by mouth three times a day as needed 9)  Proventil Hfa 108 (90 Base) Mcg/act Aers (Albuterol sulfate) .... 2 puffs as required, up to four times a day. 10)  Lasix 20 Mg Tabs (Furosemide) .... Take one (1) by mouth daily  Other Orders: No Charge Patient Arrived (NCPA0) (NCPA0)

## 2010-02-05 NOTE — Assessment & Plan Note (Signed)
Summary: ROUTINE CK/EST/VS   Vital Signs:  Patient profile:   50 year old female Height:      63 inches Weight:      310.3 pounds BMI:     55.17 Temp:     97.8 degrees F oral Pulse rate:   99 / minute BP sitting:   136 / 81  (right arm)  Vitals Entered By: Silverio Decamp NT II (February 21, 2009 10:08 AM) CC: TEST RESULTS/ NEED B12 SHOT/  Is Patient Diabetic? No Pain Assessment Patient in pain? yes     Location: shoulder and knees Intensity: 4 Type: aching Onset of pain  2 weeks Nutritional Status BMI of > 30 = obese  Does patient need assistance? Functional Status Self care Ambulation Normal   CC:  TEST RESULTS/ NEED B12 SHOT/ .  History of Present Illness: This is a 50 year old woman with past medical history outlined in this chart who is here for check up. She has had a sleep study which  was positive for OSA, but she has not gotten a CPAP machine.  She discribes significnat insomnia, daytime somnolent with frequent napping.  Her chronic cough is still present but somewhat improved.  Preventive Screening-Counseling & Management  Alcohol-Tobacco     Alcohol drinks/day: 0     Smoking Status: quit     Year Quit: as teenager   Caffeine-Diet-Exercise     Does Patient Exercise: no  Current Medications (verified): 1)  Cyanocobalamin 1000 Mcg/ml Inj Soln (Cyanocobalamin) .... Inject 1022mg Im Every  Month 2)  Omeprazole 20 Mg Cpdr (Omeprazole) .... Take 1 Tablet By Mouth Two Times A Day 3)  Valtrex 500 Mg Tabs (Valacyclovir Hcl) .... Take 1 Tablet By Mouth Two Times A Day X5 Days As Needed 4)  Multivitamins  Tabs (Multiple Vitamin) .... Iron, Calcium, Folic Acid 5)  Ferrous Gluconate   Tabs (Ferrous Gluconate Tabs) .... Take 1 Tablet By Mouth Once A Day 6)  Allegra-D 12 Hour 60-120 Mg  Xr12h-Tab (Fexofenadine-Pseudoephedrine) .... Take 1 Tablet By Mouth Two Times A Day 7)  Tessalon Perles 100 Mg Caps (Benzonatate) .... Take 1 Tablet By Mouth Three Times A Day As  Needed 8)  Lasix 20 Mg Tabs (Furosemide) .... Take One (1) By Mouth Daily  Allergies: 1)  ! Aspirin (Aspirin) 2)  ! Sulfa 3)  ! Codeine  Review of Systems       per hpi  Physical Exam  General:  alert and overweight-appearing.   Head:  normocephalic and atraumatic.   Eyes:  vision grossly intact, pupils equal, pupils round, and pupils reactive to light.   Mouth:  good dentition, pharynx pink and moist, and no erythema.   Lungs:  normal respiratory effort and normal breath sounds.   Heart:  normal rate, regular rhythm, and no murmur.   Pulses:  +1 Extremities:  +1 edema Neurologic:  alert & oriented X3, cranial nerves II-XII intact, strength normal in all extremities, and gait normal.   Skin:  dark venous stasis changes over both LE's with some skin hypertophy.  no open lesions. Cervical Nodes:  no anterior cervical adenopathy and no posterior cervical adenopathy.   Psych:  Oriented X3, memory intact for recent and remote, normally interactive, and good eye contact.     Impression & Recommendations:  Problem # 1:  ANEMIA, B12 DEFICIENCY (ICD-281.1) B12 shot today.  Her updated medication list for this problem includes:    Cyanocobalamin 1000 Mcg/ml Inj Soln (Cyanocobalamin) ..... Inject  1085mg im every  month    Ferrous Gluconate Tabs (Ferrous gluconate tabs) ..Marland Kitchen.. Take 1 tablet by mouth once a day  Orders: Admin of Therapeutic Inj  intramuscular or subcutaneous ((20601 Vit B12 1000 mcg (J3420)  Problem # 2:  SLEEP APNEA, OBSTRUCTIVE (ICD-327.23) positive sleep study, will write for CPAP today. Will have her back in one month to see how she is adjusting to CPAP.  I think this could help her significantly with chronic fatiuge, irregular sleep habits, LE edema etc.  Problem # 3:  OBESITY (ICD-278.00) Very obese.  Hopefully CPAP and better sleep could helpwith energy and result in weight loss.  Problem # 4:  Preventive Health Care (ICD-V70.0) order mammogram  today.  Complete Medication List: 1)  Cyanocobalamin 1000 Mcg/ml Inj Soln (Cyanocobalamin) .... Inject 1002m im every  month 2)  Omeprazole 20 Mg Cpdr (Omeprazole) .... Take 1 tablet by mouth two times a day 3)  Valtrex 500 Mg Tabs (Valacyclovir hcl) .... Take 1 tablet by mouth two times a day x5 days as needed 4)  Multivitamins Tabs (Multiple vitamin) .... Iron, calcium, folic acid 5)  Ferrous Gluconate Tabs (Ferrous gluconate tabs) .... Take 1 tablet by mouth once a day 6)  Allegra-d 12 Hour 60-120 Mg Xr12h-tab (Fexofenadine-pseudoephedrine) .... Take 1 tablet by mouth two times a day 7)  Tessalon Perles 100 Mg Caps (Benzonatate) .... Take 1 tablet by mouth three times a day as needed 8)  Lasix 20 Mg Tabs (Furosemide) .... Take one (1) by mouth daily  Other Orders: Mammogram (Screening) (Mammo)  Patient Instructions: 1)  You will receive a CPAP machine from [facility]. 2)  Please schedule a follow-up appointment in 1 month to follow up on CPAP.    Prevention & Chronic Care Immunizations   Influenza vaccine: Not documented   Influenza vaccine deferral: Refused  (02/21/2009)    Tetanus booster: Not documented    Pneumococcal vaccine: Not documented  Other Screening   Pap smear: NEGATIVE FOR INTRAEPITHELIAL LESIONS OR MALIGNANCY.  (08/08/2008)    Mammogram: Assessment: BIRADS 1.   (03/23/2007)   Mammogram action/deferral: Ordered  (02/21/2009)   Mammogram due: 03/22/2009   Smoking status: quit  (02/21/2009)  Lipids   Total Cholesterol: 190  (07/22/2008)   Lipid panel action/deferral: Lipid Panel ordered   LDL: 125  (07/22/2008)   LDL Direct: Not documented   HDL: 45  (07/22/2008)   Triglycerides: 100  (07/22/2008)   Nursing Instructions: Schedule screening mammogram (see order)    Medication Administration  Injection # 1:    Medication: Vit B12 1000 mcg    Diagnosis: ANEMIA, B12 DEFICIENCY (ICD-281.1)    Route: IM    Site: R deltoid    Exp Date:  0750    Lot #: [id number]    Mfr: American Regent    Patient tolerated injection without complications    Given by: KaNadine CountsADeborra Medina(February 21, 2009 11:15 AM)  Orders Added: 1)  Admin of Therapeutic Inj  intramuscular or subcutaneous [96372] 2)  Vit B12 1000 mcg [J3420] 3)  Mammogram (Screening) [Mammo] 4)  Est. Patient Level III [[id number]]

## 2010-02-05 NOTE — Progress Notes (Signed)
Summary: phone/gg  Phone Note Call from Patient   Caller: Patient Summary of Call: Pt called and would like Dr Kelton Pillar to call her with the results of her lab work. Pt # [id number] Initial call taken by: Gevena Cotton RN,  November 03, 2009 11:53 AM  Follow-up for Phone Call        I called her and talked to her about the blood work Follow-up by: Lester [place] MD,  November 03, 2009 1:50 PM

## 2010-02-05 NOTE — Assessment & Plan Note (Signed)
Summary: B12 SHOT/CH  Nurse Visit   Allergies: 1)  ! Aspirin (Aspirin) 2)  ! Sulfa 3)  ! Codeine  Medication Administration  Injection # 1:    Medication: Vit B12 1000 mcg    Diagnosis: ANEMIA, B12 DEFICIENCY (ICD-281.1)    Route: IM    Site: R deltoid    Exp Date: 10/2010    Lot #: [id number]    Mfr: American Regent    Patient tolerated injection without complications    Given by: Sander Nephew RN (January 21, 2009 10:41 AM)  Orders Added: 1)  Vit B12 1000 mcg [J3420] 2)  Admin of Therapeutic Inj  intramuscular or subcutaneous [96372]   Medication Administration  Injection # 1:    Medication: Vit B12 1000 mcg    Diagnosis: ANEMIA, B12 DEFICIENCY (ICD-281.1)    Route: IM    Site: R deltoid    Exp Date: 10/2010    Lot #: [id number]    Mfr: American Regent    Patient tolerated injection without complications    Given by: Sander Nephew RN (January 21, 2009 10:41 AM)  Orders Added: 1)  Vit B12 1000 mcg [J3420] 2)  Admin of Therapeutic Inj  intramuscular or subcutaneous [[id number]]

## 2010-02-05 NOTE — Assessment & Plan Note (Signed)
Summary: EST-1 MONTH RECHECK/CH   Vital Signs:  Patient profile:   50 year old female Height:      63 inches (160.02 cm) Weight:      311.9 pounds (141.77 kg) BMI:     55.45 Temp:     97.3 degrees F oral Pulse rate:   90 / minute BP sitting:   130 / 80  (left arm)  Vitals Entered By: Morrison Old RN (April 22, 2009 10:35 AM) CC: F/U visit. B12 shot. Allergies flare-up; Allegra-D on backorder. Is Patient Diabetic? No Pain Assessment Patient in pain? yes     Location: left upper muscle pain Intensity: 7 Type: aching Onset of pain  Intermittent; esp. with movement Nutritional Status BMI of > 30 = obese  Have you ever been in a relationship where you felt threatened, hurt or afraid?No   Does patient need assistance? Functional Status Self care Ambulation Normal   CC:  F/U visit. B12 shot. Allergies flare-up; Allegra-D on backorder.Marland Kitchen  History of Present Illness: 50yof with pmh outlined in this chart here for check up.  She was here one month ago for cerumen impaction.  She is here for B12 injection.  She reports that she has been having increased swelling in her arms and legs recently, no shortness of breath, chest pain or decrease in exercise tolerance.  She also has allergic symptoms of runny nose, post nasal drip, cough and itchy eyes.  [facility] is out of allegra D so she is waiting for it to come it.  It usually works for her. Ear ringing has resolved.  Depression History:      The patient denies a depressed mood most of the day and a diminished interest in her usual daily activities.         Preventive Screening-Counseling & Management  Alcohol-Tobacco     Alcohol drinks/day: 0     Smoking Status: quit     Year Quit: as teenager   Caffeine-Diet-Exercise     Does Patient Exercise: no  Current Medications (verified): 1)  Cyanocobalamin 1000 Mcg/ml Inj Soln (Cyanocobalamin) .... Inject 1077mg Im Every  Month 2)  Omeprazole 20 Mg Cpdr (Omeprazole) .... Take 1 Tablet  By Mouth Two Times A Day 3)  Valtrex 500 Mg Tabs (Valacyclovir Hcl) .... Take 1 Tablet By Mouth Two Times A Day X5 Days As Needed 4)  Multivitamins  Tabs (Multiple Vitamin) .... Iron, Calcium, Folic Acid 5)  Ferrous Gluconate   Tabs (Ferrous Gluconate Tabs) .... Take 1 Tablet By Mouth Once A Day 6)  Allegra-D 12 Hour 60-120 Mg  Xr12h-Tab (Fexofenadine-Pseudoephedrine) .... Take 1 Tablet By Mouth Two Times A Day 7)  Lasix 20 Mg Tabs (Furosemide) .... Take One (1) By Mouth Daily  Allergies (verified): 1)  ! Aspirin (Aspirin) 2)  ! Sulfa 3)  ! Codeine  Review of Systems       per hpi  Physical Exam  General:  alert and overweight-appearing.   Head:  normocephalic and atraumatic.   Lungs:  normal respiratory effort and normal breath sounds.   Heart:  normal rate, regular rhythm, and no murmur.   Pulses:  +1 Extremities:  +1 edema Neurologic:  alert & oriented X3, cranial nerves II-XII intact, and gait normal.   Psych:  Oriented X3, memory intact for recent and remote, normally interactive, and slightly anxious.     Impression & Recommendations:  Problem # 1:  SLEEP APNEA, OBSTRUCTIVE (ICD-327.23) has CPAP machine, uses it and likes it.  Problem # 2:  ANEMIA, B12 DEFICIENCY (ICD-281.1)  Could probably stop B12 injections, as she has been getting them for years.  Check levels today and decide on further injections. Will give today's dose.  Her updated medication list for this problem includes:    Cyanocobalamin 1000 Mcg/ml Inj Soln (Cyanocobalamin) ..... Inject 1066mg im every  month    Ferrous Gluconate Tabs (Ferrous gluconate tabs) ..Marland Kitchen.. Take 1 tablet by mouth once a day  Hgb: 12.5 (07/22/2008)   Hct: 39.6 (07/22/2008)   Platelets: 486 (07/22/2008) RBC: 4.61 (07/22/2008)   RDW: 18.1 (07/22/2008)   WBC: 11.4 (07/22/2008) MCV: 85.8 (07/22/2008)   MCHC: 31.7 (07/22/2008) Ferritin: 27 (02/08/2008) B12: >2000 pg/mL (03/14/2007)   TSH: 3.826 (07/22/2008)  Orders: T-Vitamin B12  (([id number] Vit B12 1000 mcg (J3420)  Problem # 3:  LEG EDEMA, BILATERAL (ICD-782.3) still edematous getting worse with warm weather.  Also reports that she has been eating chips and other salty foods and does not elevated her legs.  She spends a lot of time watching TV with her legs down.   Her updated medication list for this problem includes:    Lasix 20 Mg Tabs (Furosemide) ..Marland Kitchen.. Take one (1) by mouth daily  Problem # 4:  ANEMIA-IRON DEFICIENCY (ICD-280.9) check iron stores today  Her updated medication list for this problem includes:    Cyanocobalamin 1000 Mcg/ml Inj Soln (Cyanocobalamin) ..... Inject 10068m im every  month    Ferrous Gluconate Tabs (Ferrous gluconate tabs) ...Marland Kitchen. Take 1 tablet by mouth once a day  Orders: T-Vitamin B12 ([id number]-Ferritin (8(25366-44034T-Iron (8([id number]-CBC No Diff (8([id number] Complete Medication List: 1)  Cyanocobalamin 1000 Mcg/ml Inj Soln (Cyanocobalamin) .... Inject 100043mim every  month 2)  Omeprazole 20 Mg Cpdr (Omeprazole) .... Take 1 tablet by mouth two times a day 3)  Valtrex 500 Mg Tabs (Valacyclovir hcl) .... Take 1 tablet by mouth two times a day x5 days as needed 4)  Multivitamins Tabs (Multiple vitamin) .... Iron, calcium, folic acid 5)  Ferrous Gluconate Tabs (Ferrous gluconate tabs) .... Take 1 tablet by mouth once a day 6)  Allegra-d 12 Hour 60-120 Mg Xr12h-tab (Fexofenadine-pseudoephedrine) .... Take 1 tablet by mouth two times a day 7)  Tessalon Perles 100 Mg Caps (Benzonatate) .... Take 1 tablet by mouth three times a day as needed 8)  Lasix 20 Mg Tabs (Furosemide) .... Take one (1) by mouth daily  Patient Instructions: 1)  Please schedule a follow-up appointment in 6 months. 2)  You should try to keep your legs up as much as possible to decrease swelling. 3)  Avoid salty foods to decrease swelling!!!  Choose a low sodium or low salt option.  Canned and packaged foods are very salty!! 4)  You had labwork  done today, we will call you with results. 5)  You can try an over the counter allergy medicine like claritin (loratidine) or syrtec (ceterizine) until your medication comes to the pharmacy.  Prevention & Chronic Care Immunizations   Influenza vaccine: Not documented   Influenza vaccine deferral: Refused  (02/21/2009)    Tetanus booster: Not documented    Pneumococcal vaccine: Not documented  Other Screening   Pap smear: NEGATIVE FOR INTRAEPITHELIAL LESIONS OR MALIGNANCY.  (08/08/2008)    Mammogram: ASSESSMENT: Negative - BI-RADS 1^MM DIGITAL SCREENING  (02/28/2009)   Mammogram action/deferral: Ordered  (02/21/2009)   Mammogram due: 03/22/2009   Smoking status: quit  (04/22/2009)  Lipids   Total Cholesterol: 190  (07/22/2008)  Lipid panel action/deferral: Lipid Panel ordered   LDL: 125  (07/22/2008)   LDL Direct: Not documented   HDL: 45  (07/22/2008)   Triglycerides: 100  (07/22/2008)      Resource handout printed.  Process Orders Check Orders Results:     [facility]: BOB not required for this insurance Tests Sent for requisitioning (April 22, 2009 11:06 AM):     04/22/2009: [facility] -- T-Vitamin B12 [[id number]] (signed)     04/22/2009: [facility] -- T-Ferritin [phone] (signed)     04/22/2009: [facility] -- Alric Quan [[id number]] (signed)     04/22/2009: [facility] -- T-CBC No Diff [[id number]] (signed)    Appended Document: EST-1 MONTH RECHECK/CH   Medication Administration  Injection # 1:    Medication: Vit B12 1000 mcg    Diagnosis: ANEMIA, B12 DEFICIENCY (ICD-281.1)    Route: IM    Site: L deltoid    Exp Date: 12/2010    Lot #: [id number]    Mfr: American Regent    Patient tolerated injection without complications    Given by: Morrison Old RN (April 22, 2009 11:35 AM)  Orders Added: 1)  Admin of Therapeutic Inj  intramuscular or subcutaneous [96372] 2)  Vit B12  1000 mcg [T9810]

## 2010-02-05 NOTE — Assessment & Plan Note (Signed)
Summary: CHECKUP/SB.   Vital Signs:  Patient profile:   50 year old female Height:      63 inches Weight:      314.4 pounds BMI:     55.89 O2 Sat:      94 % Temp:     97.8 degrees F oral Pulse rate:   86 / minute BP sitting:   125 / 79  (right arm)  Vitals Entered By: Silverio Decamp NT II (October 30, 2009 10:58 AM) CC: cough and conjestion, Depression Is Patient Diabetic? No Pain Assessment Patient in pain? no      Nutritional Status BMI of > 30 = obese  Does patient need assistance? Functional Status Self care Ambulation Normal   Primary Care Provider:  Lester [place] MD  CC:  cough and conjestion and Depression.  History of Present Illness: 50 y/o w with h/o b12 def, depression, OSA, venous stasis ulcer comes for follow up  - no new complaints,  - wants to be checked for need for continuing b12 injection - has dry cough since 2 months, has this with every weather change, tries OTC allergy meds which helps. no fever,chills. sore throat, runiy nose, sputum production,  - compliant wiht CPAP  Depression History:      The patient denies a depressed mood most of the day and a diminished interest in her usual daily activities.         Preventive Screening-Counseling & Management  Alcohol-Tobacco     Alcohol drinks/day: 0     Smoking Status: quit     Year Quit: as teenager   Caffeine-Diet-Exercise     Does Patient Exercise: no  Current Medications (verified): 1)  Cyanocobalamin 1000 Mcg/ml Inj Soln (Cyanocobalamin) .... Inject 1051mg Im Every  Month 2)  Omeprazole 20 Mg Cpdr (Omeprazole) .... Take 1 Tablet By Mouth Two Times A Day 3)  Valtrex 500 Mg Tabs (Valacyclovir Hcl) .... Take 1 Tablet By Mouth Two Times A Day X5 Days As Needed 4)  Multivitamins  Tabs (Multiple Vitamin) .... Iron, Calcium, Folic Acid 5)  Ferrous Gluconate   Tabs (Ferrous Gluconate Tabs) .... Take 1 Tablet By Mouth Once A Day 6)  Allegra-D 12 Hour 60-120 Mg  Xr12h-Tab  (Fexofenadine-Pseudoephedrine) .... Take 1 Tablet By Mouth Two Times A Day 7)  Tessalon Perles 100 Mg Caps (Benzonatate) .... Take 1 Tablet By Mouth Three Times A Day As Needed 8)  Lasix 20 Mg Tabs (Furosemide) .... Take One (1) By Mouth Daily  Allergies (verified): 1)  ! Aspirin (Aspirin) 2)  ! Sulfa 3)  ! Codeine  Review of Systems       The patient complains of prolonged cough.  The patient denies anorexia, fever, weight loss, weight gain, vision loss, decreased hearing, hoarseness, chest pain, syncope, dyspnea on exertion, peripheral edema, headaches, hemoptysis, abdominal pain, melena, hematochezia, severe indigestion/heartburn, hematuria, incontinence, genital sores, muscle weakness, suspicious skin lesions, transient blindness, difficulty walking, depression, unusual weight change, abnormal bleeding, enlarged lymph nodes, angioedema, breast masses, and testicular masses.    Physical Exam  General:  Gen: VS reveiwed, Alert, well developed, nodistress ENT: mucous membranes pink & moist. No abnormal finds in ear and nose. CVC:S1 S2 , no murmurs, no abnormal heart sounds. Lungs: Clear to auscultation B/L. No wheezes, crackles or other abnormal sounds Abdomen: soft, non distended, no tender. Normal Bowel sounds EXT: no pitting edema, no engorged veins, Pulsations normal  Neuro:alert, oriented *3, cranial nerved 2-12 intact, strenght normal in all  extremities, senstations normal to light touch.      Impression & Recommendations:  Problem # 1:  ANEMIA, B12 DEFICIENCY (ICD-281.1)  will check level today and continue with b12 shots if needed  Her updated medication list for this problem includes:    Cyanocobalamin 1000 Mcg/ml Inj Soln (Cyanocobalamin) ..... Inject 49mg im every  month    Ferrous Gluconate Tabs (Ferrous gluconate tabs) ..Marland Kitchen.. Take 1 tablet by mouth once a day  Orders: T-CBC No Diff (([id number] T-Vitamin B12 (([id number]  Problem # 2:  LEG EDEMA, BILATERAL  (ICD-782.3) well controlled wiht lasix 20 mg no change today  Her updated medication list for this problem includes:    Lasix 20 Mg Tabs (Furosemide) ..Marland Kitchen.. Take one (1) by mouth daily  Problem # 3:  GERD (ICD-530.81) protonix 20 mg two times a day is helping her GERD no change  Her updated medication list for this problem includes:    Omeprazole 20 Mg Cpdr (Omeprazole) ..Marland Kitchen.. Take 1 tablet by mouth two times a day  Problem # 4:  SLEEP APNEA, OBSTRUCTIVE (ICD-327.23) compliant with CPAP  Problem # 5:  COUGH (ICD-786.2)  chornic cough, probably multifactorial etiology medications tried so far: 1. allegra D and other OTC anti-histaminic meds- worked, but stopped taking it because medicaid does not pay 2. flonase- causes nose bleed 3. 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 she probably needs intensive by mouth steoid+antibiotic course during this flare ups and maintainence therapy with inhaled steroids +/- leukotriene inhibitor.  she wants to go to ENT doctor to explore more options including possible surgical options  plan ENT referral nasal salline spray defer Abx and by mouth steroids to ENT  Orders: ENT Referral (ENT)  Complete Medication List: 1)  Cyanocobalamin 1000 Mcg/ml Inj Soln (Cyanocobalamin) .... Inject 10030m im every  month 2)  Omeprazole 20 Mg Cpdr (Omeprazole) .... Take 1 tablet by mouth two times a day 3)  Valtrex 500 Mg Tabs (Valacyclovir hcl) .... Take 1 tablet by mouth two times a day x5 days as needed 4)  Multivitamins Tabs (Multiple vitamin) .... Iron, calcium, folic acid 5)  Ferrous Gluconate Tabs (Ferrous gluconate tabs) .... Take 1 tablet by mouth once a day 6)  Lasix 20 Mg Tabs (Furosemide) .... Take one (1) by mouth daily 7)  Nasal 0.65 % Soln (Saline) ...Marland Kitchen 1 spray in each nostril 3-4 times a day 8)  Fexofenadine Hcl 180 Mg Tabs (Fexofenadine hcl) .... Take 1 tablet by mouth once a day  Patient  Instructions: 1)  Please schedule a follow-up appointment in 6 months. 2)  Get plenty of rest, drink lots of clear liquids, and use Tylenol or Ibuprofen for fever and comfort. Return in 7-10 days if you're not better:sooner if you're feeling worse. 3)  Acute sinusitis symptoms for less than 10 days are not helped by antibiotics.Use warm moist compresses, and over the counter decongestants ( only as directed). Call if no improvement in 5-7 days, sooner if increasing pain, fever, or new symptoms. Prescriptions: FEXOFENADINE HCL 180 MG TABS (FEXOFENADINE HCL) Take 1 tablet by mouth once a day  #31 x 2   Entered and Authorized by:   MaLester CarolinaD   Signed by:   MaLester CarolinaD on 10/30/2009   Method used:   Electronically to        [phone](retail)       [address]  [place], [place]  [address]       Ph: [phone]       Fax: [phone]   RxID:   [id number] NASAL 0.65 % SOLN (SALINE) 1 spray in each nostril 3-4 times a day  #1 x prn   Entered and Authorized by:   Lester [place] de Grace MD   Signed by:   Lester Hostetter MD on 10/30/2009   Method used:   Electronically to        [facility] [phone]* (retail)       [place], [place]  [address]       Ph: [phone]       Fax: [phone]   RxID:   [id number] FLUTICASONE PROPIONATE 50 MCG/ACT SUSP (FLUTICASONE PROPIONATE) 1 spray in each nostril at beditime  #1 x 3   Entered and Authorized by:   Lester Sardis MD   Signed by:   Lester Barrington MD on 10/30/2009   Method used:   Electronically to        [facility] [phone]* (retail)       [address]       [place], [place]  [address]       Ph: [phone]       Fax: [phone]   RxID:   [id number]    Orders Added: 1)  T-CBC No Diff [[id number]] 2)  T-Vitamin B12 [[id number]] 3)  ENT Referral [ENT] 4)  Est. Patient Level IV [40768]    Process Orders Check Orders Results:     [facility]: [facility]  not required for this insurance Tests Sent for requisitioning (October 30, 2009 12:08 PM):     10/30/2009: [facility] -- T-CBC No Diff [[id number]] (signed)     10/30/2009: [facility] -- T-Vitamin B12 [[id number]] (signed)

## 2010-02-05 NOTE — Letter (Signed)
Summary: [facility]   Imported By: Enedina Finner 01/16/2010 11:27:37  _____________________________________________________________________  External Attachment:    Type:   Image     Comment:   External Document

## 2010-02-05 NOTE — Assessment & Plan Note (Signed)
Summary: built up wax in both ears/pcp-walsh/hla   Vital Signs:  Patient profile:   50 year old female Height:      63 inches (160.02 cm) Weight:      311.4 pounds (141.55 kg) BMI:     55.36 Temp:     97.4 degrees F (36.33 degrees C) oral Pulse rate:   94 / minute BP sitting:   127 / 74  (right arm)  Vitals Entered By: Morrison Old RN (March 11, 2009 2:36 PM) CC: Wax build-up - ears are ringing/buzzing sound. Is Patient Diabetic? No Pain Assessment Patient in pain? yes     Location: left leg  Intensity: 5 Type: aching Onset of pain  Intermittent; esp. when walking. Nutritional Status BMI of > 30 = obese  Does patient need assistance? Functional Status Self care Ambulation Normal   CC:  Wax build-up - ears are ringing/buzzing sound..  History of Present Illness: [name] is a 50 yo F with PMH outlined in chart who presents with buildup of wax in ears. She says that for the past couple yeas, she will get waxy buildup in both ears that leads to buzzing ("like a bunch of bees") and ringing of the ears and her voice gets louder. On those prior occurrences, she comes to clinic to have her ears irrigated, which produces almost instantaneous relief of symptoms. She returns to clinic today complaining of the same symptoms, R ear worse than L, and to have the wax cleaned out. She denies ear pain, fevers, or recent aspirin ingestion.  Depression History:      The patient denies a depressed mood most of the day and a diminished interest in her usual daily activities.         Preventive Screening-Counseling & Management  Alcohol-Tobacco     Alcohol drinks/day: 0     Smoking Status: quit     Year Quit: as teenager   Caffeine-Diet-Exercise     Does Patient Exercise: no  Current Medications (verified): 1)  Cyanocobalamin 1000 Mcg/ml Inj Soln (Cyanocobalamin) .... Inject 1060mg Im Every  Month 2)  Omeprazole 20 Mg Cpdr (Omeprazole) .... Take 1 Tablet By Mouth Two Times A Day 3)   Valtrex 500 Mg Tabs (Valacyclovir Hcl) .... Take 1 Tablet By Mouth Two Times A Day X5 Days As Needed 4)  Multivitamins  Tabs (Multiple Vitamin) .... Iron, Calcium, Folic Acid 5)  Ferrous Gluconate   Tabs (Ferrous Gluconate Tabs) .... Take 1 Tablet By Mouth Once A Day 6)  Allegra-D 12 Hour 60-120 Mg  Xr12h-Tab (Fexofenadine-Pseudoephedrine) .... Take 1 Tablet By Mouth Two Times A Day 7)  Tessalon Perles 100 Mg Caps (Benzonatate) .... Take 1 Tablet By Mouth Three Times A Day As Needed 8)  Lasix 20 Mg Tabs (Furosemide) .... Take One (1) By Mouth Daily  Allergies (verified): 1)  ! Aspirin (Aspirin) 2)  ! Sulfa 3)  ! Codeine  Past History:  Past Medical History: Last updated: 05/30/2008 Anemia, iron deficiency - 2nd to menorrhagia Anemia, B-12 deficiency Depression GERD Obesity NSVD x1 (1986) Mental retardation, borderline Thrombocytosis 2nd iron deficiency Neutrophilia, chronic Genital Herpes, HSV 2 pos (2000) Cough, chronic - 2nd post-nasal drip & GERD CHOLECYSTECTOMY, HX OF    Past Surgical History: Last updated: 10/19/2005 Cholecystectomy  Family History: Last updated: 0March 22, 2010Mother had HTN but died of an MVA. Father - unknown health hx Son - healthy  Social History: Last updated: 003/22/10Single Never Smoked Alcohol use-no Drug use-no Regular exercise-no  Has 1 son, lives with him in [place] (with 2 additional roommates). Doesn't work.  Risk Factors: Alcohol Use: 0 (03/11/2009) Exercise: no (03/11/2009)  Risk Factors: Smoking Status: quit (03/11/2009)  Review of Systems      See HPI  Physical Exam  General:  Well-developed,well-nourished,in no acute distress; alert,appropriate and cooperative throughout examination. She seems to be talking loudly. Head:  Normocephalic and atraumatic without obvious abnormalities. No apparent alopecia or balding. Ears:  significant buildup of cerumen in both ears, the canal is almost completely occluded in the R ear.  Wax is yellow and appears soft. No erythema. Neurologic:  alert & oriented X3.   Psych:  Cognition and judgment appear intact. Alert and cooperative with normal attention span and concentration. No apparent delusions, illusions, hallucinations   Impression & Recommendations:  Problem # 1:  CERUMEN REMOVAL, HX OF (ICD-V15.89) Patient's ears were irrigated with saline and wax was removed by Glenda. I rechecked the patient's ears afterwards and they were significantly improved. She reported improvement of the ringing/buzzing in her ears, and I told her if it doesn't completely resolve over the next few days to please call the clinic. She agreed.  Complete Medication List: 1)  Cyanocobalamin 1000 Mcg/ml Inj Soln (Cyanocobalamin) .... Inject 1074mg im every  month 2)  Omeprazole 20 Mg Cpdr (Omeprazole) .... Take 1 tablet by mouth two times a day 3)  Valtrex 500 Mg Tabs (Valacyclovir hcl) .... Take 1 tablet by mouth two times a day x5 days as needed 4)  Multivitamins Tabs (Multiple vitamin) .... Iron, calcium, folic acid 5)  Ferrous Gluconate Tabs (Ferrous gluconate tabs) .... Take 1 tablet by mouth once a day 6)  Allegra-d 12 Hour 60-120 Mg Xr12h-tab (Fexofenadine-pseudoephedrine) .... Take 1 tablet by mouth two times a day 7)  Tessalon Perles 100 Mg Caps (Benzonatate) .... Take 1 tablet by mouth three times a day as needed 8)  Lasix 20 Mg Tabs (Furosemide) .... Take one (1) by mouth daily  Patient Instructions: 1)  Schedule a follow-up appointment in 3-6 months with your primary care doctor. 2)  If the ringing/buzzing in your ears do not improve in the next few days or so, please call the clinic.  Prevention & Chronic Care Immunizations   Influenza vaccine: Not documented   Influenza vaccine deferral: Refused  (02/21/2009)    Tetanus booster: Not documented    Pneumococcal vaccine: Not documented  Other Screening   Pap smear: NEGATIVE FOR INTRAEPITHELIAL LESIONS OR MALIGNANCY.   (08/08/2008)    Mammogram: ASSESSMENT: Negative - BI-RADS 1^MM DIGITAL SCREENING  (02/28/2009)   Mammogram action/deferral: Ordered  (02/21/2009)   Mammogram due: 03/22/2009   Smoking status: quit  (03/11/2009)  Lipids   Total Cholesterol: 190  (07/22/2008)   Lipid panel action/deferral: Lipid Panel ordered   LDL: 125  (07/22/2008)   LDL Direct: Not documented   HDL: 45  (07/22/2008)   Triglycerides: 100  (07/22/2008)      Resource handout printed.

## 2010-02-05 NOTE — Progress Notes (Signed)
Summary: Refill/gh  Phone Note Refill Request Message from:  Fax from Pharmacy on September 09, 2009 4:54 PM  Refills Requested: Medication #1:  LASIX 20 MG TABS Take one (1) by mouth daily.   Dosage confirmed as above?Dosage Confirmed   Last Refilled: 05/02/2009  Method Requested: Electronic Initial call taken by: Sander Nephew RN,  September 09, 2009 4:54 PM  Follow-up for Phone Call        Please ask patient  to come in for a follow up Follow-up by: Lester Franklin MD,  September 09, 2009 5:11 PM    Prescriptions: LASIX 20 MG TABS (FUROSEMIDE) Take one (1) by mouth daily  #30 x 3   Entered and Authorized by:   Lester Lincolnwood MD   Signed by:   Lester Cartago MD on 09/09/2009   Method used:   Electronically to        [facility] [phone]* (retail)       [address]       [place],   [address]       Ph: [phone]       Fax: [phone]   RxID:   [id number]

## 2010-02-05 NOTE — Miscellaneous (Signed)
Summary: [facility]: Order CPAP Machine  [facility]: Order CPAP Machine   Imported By: Bonner Puna 02/27/2009 14:07:21  _____________________________________________________________________  External Attachment:    Type:   Image     Comment:   External Document

## 2010-02-05 NOTE — Progress Notes (Signed)
----  Converted from flag ---- ---- 04/24/2009 3:17 PM, Morrison Old RN wrote: Pt. was called and informed B12 level is fine, she does not have to come in for monthly injections and will re-check level in 6 months per Dr. Volanda Napoleon.  ---- 04/24/2009 9:55 AM, Myrtis Ser MD wrote: Could you call Ms. Takayama and let her know that her B12 level is fine.  She does not need to come in for monthly injections.  We will recheck her level in 6 months to be sure the level stays up.  Thanks,  catherine ------------------------------

## 2010-02-05 NOTE — Letter (Signed)
Summary: [facility]   Imported By: Garlan Fillers 01/30/2010 16:46:06  _____________________________________________________________________  External Attachment:    Type:   Image     Comment:   External Document

## 2010-02-05 NOTE — Assessment & Plan Note (Signed)
Summary: B12 INJ/CH  Nurse Visit   Allergies: 1)  ! Aspirin (Aspirin) 2)  ! Sulfa 3)  ! Codeine  Medication Administration  Injection # 1:    Medication: Vit B12 1000 mcg    Diagnosis: ANEMIA, B12 DEFICIENCY (ICD-281.1)    Route: IM    Site: L deltoid    Exp Date: 11/2010    Lot #: [id number]    Mfr: [employer]    Patient tolerated injection without complications    Given by: Freddy Finner RN (March 11, 2009 10:59 AM)  Orders Added: 1)  Vit B12 1000 mcg [J3420] 2)  Admin of Therapeutic Inj  intramuscular or subcutaneous [01561]

## 2010-02-05 NOTE — Consult Note (Signed)
Summary: [facility]  [facility]   Imported By: Lacy Duverney 11/21/2009 16:51:34  _____________________________________________________________________  External Attachment:    Type:   Image     Comment:   External Document

## 2010-02-05 NOTE — Letter (Signed)
Summary: [facility]   Imported By: Enedina Finner 01/16/2010 11:27:01  _____________________________________________________________________  External Attachment:    Type:   Image     Comment:   External Document

## 2010-03-10 ENCOUNTER — Other Ambulatory Visit: Payer: Self-pay | Admitting: Family Medicine

## 2010-03-10 DIAGNOSIS — Z1231 Encounter for screening mammogram for malignant neoplasm of breast: Secondary | ICD-10-CM

## 2010-03-19 ENCOUNTER — Ambulatory Visit (HOSPITAL_COMMUNITY)
Admission: RE | Admit: 2010-03-19 | Discharge: 2010-03-19 | Disposition: A | Discharge status: Home / Self Care | Payer: Medicaid Other | Source: Ambulatory Visit | Attending: Family Medicine | Admitting: Family Medicine

## 2010-03-19 DIAGNOSIS — Z1231 Encounter for screening mammogram for malignant neoplasm of breast: Secondary | ICD-10-CM

## 2010-04-10 LAB — URINALYSIS, ROUTINE W REFLEX MICROSCOPIC
Urobilinogen, UA: 0.2 mg/dL (ref 0.0–1.0)
pH: 6 (ref 5.0–8.0)

## 2010-04-10 LAB — WET PREP, GENITAL: Yeast Wet Prep HPF POC: NONE SEEN

## 2010-04-10 LAB — URINE MICROSCOPIC-ADD ON

## 2010-04-16 LAB — BASIC METABOLIC PANEL
BUN: 11 mg/dL (ref 6–23)
BUN: 12 mg/dL (ref 6–23)
CO2: 33 mEq/L — ABNORMAL HIGH (ref 19–32)
Calcium: 8.2 mg/dL — ABNORMAL LOW (ref 8.4–10.5)
Calcium: 8.4 mg/dL (ref 8.4–10.5)
Chloride: 101 mEq/L (ref 96–112)
Creatinine, Ser: 0.57 mg/dL (ref 0.4–1.2)
Creatinine, Ser: 0.62 mg/dL (ref 0.4–1.2)
GFR calc Af Amer: >60 mL/min (ref >60)
GFR calc Af Amer: >60 mL/min (ref >60)
GFR calc non Af Amer: >60 mL/min (ref >60)
GFR calc non Af Amer: >60 mL/min (ref >60)
Glucose, Bld: 102 mg/dL — ABNORMAL HIGH (ref 70–99)
Glucose, Bld: 85 mg/dL (ref 70–99)
Potassium: 3.6 mEq/L (ref 3.5–5.1)
Potassium: 3.6 mEq/L (ref 3.5–5.1)
Sodium: 138 mEq/L (ref 135–145)
Sodium: 140 mEq/L (ref 135–145)
Sodium: 141 mEq/L (ref 135–145)

## 2010-04-16 LAB — CBC
HCT: 32.6 % — ABNORMAL LOW (ref 36.0–46.0)
HCT: 32.7 % — ABNORMAL LOW (ref 36.0–46.0)
HCT: 34.6 % — ABNORMAL LOW (ref 36.0–46.0)
Hemoglobin: 10.1 g/dL — ABNORMAL LOW (ref 12.0–15.0)
Hemoglobin: 10.2 g/dL — ABNORMAL LOW (ref 12.0–15.0)
Hemoglobin: 10.3 g/dL — ABNORMAL LOW (ref 12.0–15.0)
MCHC: 29.8 g/dL — ABNORMAL LOW (ref 30.0–36.0)
MCHC: 30.4 g/dL (ref 30.0–36.0)
MCV: 70 fL — ABNORMAL LOW (ref 78.0–100.0)
MCV: 71.3 fL — ABNORMAL LOW (ref 78.0–100.0)
Platelets: 533 10*3/uL — ABNORMAL HIGH (ref 150–400)
Platelets: 581 10*3/uL — ABNORMAL HIGH (ref 150–400)
RBC: 4.65 MIL/uL (ref 3.87–5.11)
RDW: 23.1 % — ABNORMAL HIGH (ref 11.5–15.5)
RDW: 23.3 % — ABNORMAL HIGH (ref 11.5–15.5)
RDW: 23.5 % — ABNORMAL HIGH (ref 11.5–15.5)
WBC: 16.3 10*3/uL — ABNORMAL HIGH (ref 4.0–10.5)
WBC: 16.5 10*3/uL — ABNORMAL HIGH (ref 4.0–10.5)

## 2010-04-16 LAB — TSH: TSH: 1.644 u[IU]/mL (ref 0.350–4.500)

## 2010-04-16 LAB — DIFFERENTIAL
Band Neutrophils: 0 % (ref 0–10)
Blasts: 0 %
Lymphocytes Relative: 7 % — ABNORMAL LOW (ref 12–46)
Lymphs Abs: 1.1 10*3/uL (ref 0.7–4.0)
Myelocytes: 0 %
Promyelocytes Absolute: 0 %

## 2010-04-16 LAB — URINE MICROSCOPIC-ADD ON

## 2010-04-16 LAB — URINALYSIS, ROUTINE W REFLEX MICROSCOPIC
Bilirubin Urine: NEGATIVE
Ketones, ur: NEGATIVE mg/dL
pH: 7 (ref 5.0–8.0)

## 2010-04-16 LAB — RETICULOCYTES
RBC.: 4.83 MIL/uL (ref 3.87–5.11)
Retic Count, Absolute: 82.1 10*3/uL (ref 19.0–186.0)
Retic Ct Pct: 1.7 % (ref 0.4–3.1)

## 2010-04-16 LAB — POCT CARDIAC MARKERS
CKMB, poc: 3.6 ng/mL (ref 1.0–8.0)
Myoglobin, poc: 101 ng/mL (ref 12–200)
Troponin i, poc: <0.05 ng/mL (ref 0.00–0.09)

## 2010-04-16 LAB — COMPREHENSIVE METABOLIC PANEL
Alkaline Phosphatase: 102 U/L (ref 39–117)
BUN: 8 mg/dL (ref 6–23)
CO2: 30 mEq/L (ref 19–32)
Calcium: 8.5 mg/dL (ref 8.4–10.5)
GFR calc non Af Amer: >60 mL/min (ref >60)
Glucose, Bld: 181 mg/dL — ABNORMAL HIGH (ref 70–99)
Total Protein: 7.3 g/dL (ref 6.0–8.3)

## 2010-04-16 LAB — POCT I-STAT, CHEM 8
BUN: 5 mg/dL — ABNORMAL LOW (ref 6–23)
Calcium, Ion: 1.05 mmol/L — ABNORMAL LOW (ref 1.12–1.32)
HCT: 39 % (ref 36.0–46.0)
Potassium: 3.6 mEq/L (ref 3.5–5.1)

## 2010-04-16 LAB — PROTIME-INR
INR: 1 (ref 0.00–1.49)
Prothrombin Time: 13.6 seconds (ref 11.6–15.2)

## 2010-04-16 LAB — LIPID PANEL
Cholesterol: 163 mg/dL (ref 0–200)
Total CHOL/HDL Ratio: 4.8 RATIO

## 2010-04-16 LAB — CORTISOL: Cortisol, Plasma: 39.1 ug/dL

## 2010-04-16 LAB — CARDIAC PANEL(CRET KIN+CKTOT+MB+TROPI)
Relative Index: INVALID (ref 0.0–2.5)
Relative Index: INVALID (ref 0.0–2.5)
Total CK: 81 U/L (ref 7–177)
Troponin I: <0.01 ng/mL (ref 0.00–0.06)

## 2010-04-16 LAB — APTT: aPTT: 29 seconds (ref 24–37)

## 2010-04-16 LAB — FOLATE: Folate: 14.5 ng/mL

## 2010-04-16 LAB — BRAIN NATRIURETIC PEPTIDE: Pro B Natriuretic peptide (BNP): <30 pg/mL (ref 0.0–100.0)

## 2010-05-19 NOTE — Consult Note (Signed)
[name], [name]               ACCOUNT NO.:  [id number]   MEDICAL RECORD NO.:  [id number]         PATIENT TYPE:  HREC   LOCATION:  FOOT                         FACILITY:  [facility]   PHYSICIAN:  Viviann Spare. Nyoka Cowden, M.D.  DATE OF BIRTH:  01-04-1961   DATE OF CONSULTATION:  08/15/2008  DATE OF DISCHARGE:                                 CONSULTATION   REFERRING PHYSICIANS:  Ludwig Lean, MD and Leonel Ramsay, MD, from [facility].   PROBLEM:  Ulceration to the legs, felt secondary to venous stasis  disease and poor healing.   HISTORY:  A 50 year old female makes her initial visit to the [facility] for evaluation of wounds of her lower legs.  The  wounds apparently have taken a turn for the better since she was seen by  her doctors about 3 weeks ago.  She was put on doxycycline 100 mg twice  daily for poorly healing wounds of the legs associated with venous  stasis disease, and the wounds are doing much better.  There continue to  be some areas of folliculitis and small pustules.  The patient also has  a bad habit of scratching and digging at herself, and there are areas of  excoriation and open ulcerated areas related to this problem.   PAST SURGICAL HISTORY:  Cholecystectomy in 1986.   PAST MEDICAL HISTORY:  Includes problems of bilateral leg edema,  amenorrhea, history of vaginitis, foot pain, GERD, iron deficiency  anemia, recurrent cough, depression, thrombocytosis, B12 deficiency,  mental retardation, obesity.   CURRENT MEDICATIONS:  1. B12 1000 mcg intramuscular monthly.  2. Omeprazole 20 mg b.i.d.  3. Valtrex 500 mg b.i.d. as needed for recurrent herpes genitalis.  4. Multivitamins 1 daily.  5. Ferrous gluconate 1 daily.  6. Allegra-D 12 b.i.d.  7. Monistat 7 p.r.n.  8. Tessalon Perles p.r.n.  9. Proventil HFA p.r.n.  10.Ultracet 37.5/325 two, up to four times daily as needed for pain.  11.Furosemide 20 mg daily.  12.Most recently, doxycycline 100 mg twice daily, which she has now      finished.   REVIEW OF SYSTEMS:  The patient states that her rash and leg problems  began at least January 2010, they have been intermittently present since  then.  The doxycycline seems to be the thing that was most helpful to  her.  She continues to worry about the swelling in her legs.  The  patient has had a prior echocardiogram which showed normal left  ventricular systolic ejection fraction of 65-70%, and there was no sign  of DVT on venous Doppler exams.  Chronic venous insufficiency is  postulated to be the cause of her edema.   FAMILY HISTORY:  Indicates mother had hypertension, but died of a motor  vehicle accident.  Father has unknown health.  She has a son who  reportedly is healthy.   SOCIAL HISTORY:  Indicates that she is single, never smoked, use no  alcohol or drugs, does not exercise, and lives with her son.  She is  unemployed.   PHYSICAL EXAMINATION:  VITAL SIGNS:  Temp 98.3, pulse 98, respirations  21, blood pressure 133/83.  SKIN:  There are multiple pustules on the skin.  There are many scarred  areas.  There is a davit-like excoriation on the left leg above the  kneecap.  There is a pustule of the left lower extremity and right lower  extremity, both of which were unroofed.  Culture was obtained from these  pustules in the course of the exam.  GENERAL:  The patient is obese, alert, cooperative with the exam, and is  somewhat poor historian.  HEAD, EYES, EARS, NOSE, AND THROAT:  Wears prescription lenses.  Hearing  grossly normal.  Oropharynx unremarkable.  NECK:  Supple.  No thyromegaly, nodule, mass, or bruit.  CHEST:  Clear to auscultation.  HEART:  Regular rhythm with no gallop, murmur, click, or rub.  Mild  tachycardia present.  ABDOMEN:  Obese, nontender.  No organomegaly.  EXTREMITIES:  No significant deformities.  NEUROLOGIC:  Grossly normal except for mental retardation.   Cranial  nerves intact.  No sensory deficits noted in the legs.  MUSCULOSKELETAL:  No joint deformities.   PROBLEM:  Pustules and ulcerations of edematous legs.   TREATMENT:  Following culture, we applied Silvadene cream to the open  areas on the legs, covered with Adaptic, and then applied Unna boot  bilaterally.  We gave her a prescription for ciprofloxacin, to be taken  500 mg twice daily over the next week.  She is to return for recheck in  clinic in 1 week.      Viviann Spare Nyoka Cowden, M.D.  Electronically Signed     AGG/MEDQ  D:  08/15/2008  T:  08/16/2008  Job:  [id number]   cc:   Leonel Ramsay, MD  Ludwig Lean, MD

## 2010-05-19 NOTE — Discharge Summary (Signed)
[name], DEKKER NO.:  [id number]   MEDICAL RECORD NO.:  [id number]          PATIENT TYPE:  INP   LOCATION:  7628                         FACILITY:  [facility]   PHYSICIAN:  Thomes Lolling, M.D.    DATE OF BIRTH:  Feb 28, 1960   DATE OF ADMISSION:  03/20/2008  DATE OF DISCHARGE:  03/25/2008                               DISCHARGE SUMMARY   PRIMARY CARE PHYSICIAN:  Alphia Moh, MD at the [facility].   DISCHARGE DIAGNOSES:  1. Acute bronchitis.  2. Atypical pneumonia.  3. Gastroesophageal reflux disease.  4. Anemia secondary to iron and B12 deficiency.  5. Obesity.  6. Chronic leg edema.   DISCHARGE MEDICATIONS:  1. Zithromax 500 mg once daily for 5 days.  2. Prednisone taper.  3. B12 injections 1000 mcg monthly.  4. Omeprazole 20 mg 1 tablet twice a day.  5. Valtrex 500 mg 1 tablet twice a day as needed.  6. Multivitamin once daily.  7. Ferrous gluconate 1 tablet by mouth once daily.  8. Allegra-D 60-120 1 tablet twice daily.  9. Tessalon Perles 100 mg 1 tablet by mouth 3 times a day as needed.   DISPOSITION AND FOLLOWUP:  The patient is to follow with her primary  care physician at the [facility].  A followup  appointment has been set up for April 10, 2008.  At the followup visit,  the patient will be reviewed for resolution of her breathing problems.  It is recommended that pulmonary function test be considered as  outpatient to define the patient's respiratory status.  Repeat CBC is  recommended to check on her anemia.   STUDIES/PROCEDURES:  CT chest, March 22, 2008; impression, bibasilar  atelectasis with possible minimal peribronchial inflammation in the  right upper lobe, mildly prominent mediastinal and right infrahilar  lymph nodes, questionable prior splenectomy versus early infarction.   CONSULTS:  None.   BRIEF ADMISSION HISTORY:  Ms. Bernardy is a 50 year old woman with major  depression, GERD, chronic cough who presents to the [facility] ED with  cough and dyspnea on March 20, 2008.  She was evaluated and diagnosed  with asthmatic bronchitis and was about to be sent home on antibiotics  and steroids when she was found to be saturating at 86% on room air.  The patient explained that she had been experiencing cough for the past  few months.  She mentioned of worsening wheezing for the past 1 week.  She also mentioned of chest pain along with her cough.  No phlegm  production at this time, but she had had phlegm earlier.  There is no  fever or chills.  She mentioned of orthopnea for 1 week, but no PND.  She also noticed bilateral lower extremity edema progressing over the  prior few weeks, although it was unclear how frequently she got leg  edema.  She had chronic exertional dyspnea, which worsened over the past  week prior to presentation.  She also has chronic allergic rhinitis  symptoms for which she takes Allegra on a daily basis.  ADMISSION PHYSICAL EXAMINATION:  VITAL SIGNS:  Temperature 97.4, oxygen  saturation 93% on room air, pulse rate 93 per minute, respirations 22  per minute, blood pressure 141/85.  GENERAL:  Morbidly obese middle-aged woman, sitting up in stretcher,  tachypneic when talking.  HEAD, EYE, ENT:  Normocephalic, atraumatic.  PERRLA, anicteric.  No  external deformities of the ear or nose.  NECK:  Obese.  No lymphadenopathy.  LUNGS:  Crackles at the mid lungs bilaterally.  HEART:  Regular rate and rhythm, tachycardia.  ABDOMEN:  Obese, soft, nontender.  PULSES:  Radial pulses 2+ and 1+ pedal pulse bilaterally.  EXTREMITIES:  Bilateral lower extremity edema, 1 to 2+ up to knees.  NEUROLOGIC:  Alert and oriented x3.  Moving all 4 extremities.  Cranial  nerves II through XII are grossly intact.  SKIN:  Hyperkeratotic.  PSYCH:  Alternates between tangential and circumferential speech.  Oriented.  Good eye contact, not anxious appearing,  and not distressed  appearing.   ADMISSION LABORATORY DATA:  Hemoglobin 13.3, hematocrit 39, sodium 143,  potassium 3.6, chloride 98, glucose 99, BUN 5, creatinine 0.6,  bicarbonate 31.   HOSPITAL COURSE:  1. Shortness of breath.  This was most likely secondary to acute      bronchitis along with atypical pneumonia with or without some      asthmatic component.  The patient was admitted and given regular      breathing treatment.  She was also started on prednisone and      antibiotic treatments with Zithromax to which she responded very      well.  2. Leg edema.  We did an echocardiography, which revealed normal left      ventricular systolic function with ejection fraction of 65%-70% and      thereby ruling out heart failure.  There was no sign of DVT on      venous Doppler examination.  It is possible that the patient has      chronic venous insufficiency, which might have been aggravated by      hypoalbuminemic state.  The patient was simply given supportive      care.  3. Anemia.  This was likely combination of iron deficiency and B12      deficiency.  We simply monitored her hemoglobin and hematocrit,      which remained stable during hospital stay.  4. GERD.  We continued her Protonix.   DISCHARGE DAY VITAL SIGNS:  Temperature 97.9, pulse 88, respiration 20,  blood pressure 140/67, oxygen saturation 97% on 2 L.   DISCHARGE DAY LABORATORY DATA:  Sodium 140, potassium 3.6, chloride 101,  bicarbonate 33, glucose 102, BUN 11, creatinine 0.64, calcium 8.5,  hemoglobin 10.2, WBC 16.5, MCV 70, platelet 581.      Dawna Part, MD  Electronically Signed      Thomes Lolling, M.D.  Electronically Signed    AS/MEDQ  D:  05/10/2008  T:  05/11/2008  Job:  [id number]

## 2010-05-19 NOTE — Assessment & Plan Note (Signed)
[facility]   NAME:  [name], [name]               ACCOUNT NO.:  [id number]   MEDICAL RECORD NO.:  [id number]      DATE OF BIRTH:  Jul 24, 1960   PHYSICIAN:  Ricard Dillon, M.D.      VISIT DATE:                                   OFFICE VISIT   [name] is a lady we have been following for predominately venostasis  ulceration.  However, she probably also has a considerable degree of  folliculitis in both legs.  There are several multi-healed scars from  this and also several wounds on her lower extremities.  One of her last  cultures grew pan sensitive Staph aureus.  I actually changed her to  Keflex last visit and put her an Unna wraps.   On examination, the wounds actually look considerably better.  There is  only one small pustule that I was able to see.   IMPRESSION:  1. Improving venostasis ulceration.  2. Staphylococcus aureus folliculitis.  She will continue with her      Keflex.  We put her back in her own stockings.  At this point, I do      not think there is any purpose in her following here.  I suspect      that she will have recurrent ulceration in her legs at some point      and no doubt we will be seeing her back.  At this point, however, I      think she can be safely discharged.           ______________________________  Ricard Dillon, M.D.     MGR/MEDQ  D:  08/30/2008  T:  08/31/2008  Job:  [id number]

## 2010-05-19 NOTE — Assessment & Plan Note (Signed)
[facility]   NAME:  [name], [name]               ACCOUNT NO.:  [id number]   MEDICAL RECORD NO.:  [id number]      DATE OF BIRTH:  March 10, 1960   PHYSICIAN:  Ricard Dillon, M.D. VISIT DATE:  08/22/2008                                   OFFICE VISIT   [name] is a lady we have been following for predominant venous  stasis physiology.  However, she also has considerable degree of  probable folliculitis in her legs.  There are multiple healed scars from  this, also several wounds.  One of them was cultured on her last visit  and grew penicillin-sensitive/pansensitive staph aureus.  She is  completing a course of ciprofloxacin.   On examination, she is afebrile.  She does not look systemically ill.  There is only one small open area left on this woman's leg.  She has  multiple small pustules on her leg which I am assuming of the staph  aureus.   IMPRESSION:  1. Improving venous stasis ulceration with only one small area      remaining on the right anterior leg.  2. Staph aureus folliculitis, which I think has been present on her      legs and probably arms in the past.  I have changed her to Keflex      500 q.6 for the next 10 days.  I have asked her to bring in her      previous pressure stockings which she said she obtained at the      [facility].  I think she will need prescription      stockings ultimately probably above-knee, although I would like to      see her current stockings.  She tells me that she felt the      stockings actually were too tight just below the knee and caused      pain and some of the edema.  We will see that when she brings them      in.  I am expecting that I have to write another prescription for      graded pressure stockings.  There may be financial issues here.  We      will review her in a week.           ______________________________  Ricard Dillon, M.D.     MGR/MEDQ  D:  08/22/2008  T:   08/23/2008  Job:  [id number]

## 2010-05-25 ENCOUNTER — Telehealth: Payer: Self-pay | Admitting: *Deleted

## 2010-05-25 NOTE — Telephone Encounter (Signed)
Pt calls and states somewhat confusingly that she has sores on her R leg that are draining, after asking several questions it is determined that for possibly a week these blister like areas have been draining, i can't really determine the consistency or color of the drainage or anything that might have caused these, she is ask to come in for appt and is agreeable and will come 5/22 at 1015

## 2010-05-26 ENCOUNTER — Ambulatory Visit (INDEPENDENT_AMBULATORY_CARE_PROVIDER_SITE_OTHER): Payer: Medicaid Other | Admitting: Internal Medicine

## 2010-05-26 ENCOUNTER — Encounter: Payer: Self-pay | Admitting: Internal Medicine

## 2010-05-26 VITALS — BP 141/84 | HR 91 | Temp 96.7°F | Ht 64.0 in | Wt 313.6 lb

## 2010-05-26 DIAGNOSIS — G4733 Obstructive sleep apnea (adult) (pediatric): Secondary | ICD-10-CM

## 2010-05-26 DIAGNOSIS — L97909 Non-pressure chronic ulcer of unspecified part of unspecified lower leg with unspecified severity: Secondary | ICD-10-CM

## 2010-05-26 DIAGNOSIS — I83009 Varicose veins of unspecified lower extremity with ulcer of unspecified site: Secondary | ICD-10-CM

## 2010-05-26 NOTE — Progress Notes (Signed)
Info faxed to [place] for appt - pt aware the [facility] will call her with appt. Hilda Blades Jarquez Mestre RN 05/26/10 1PM

## 2010-05-26 NOTE — Progress Notes (Signed)
  Subjective:    [name], female    DOB: [birth year], 50 y.o.   MRN: [id number]  HPI [name] is a 49 year old woman with a history of mental retardation, chronic venous stasis ulcers across the clinic with weeping right leg and heel ulcer. She's having chronic venous insufficiency for more than 2 years now and has chronic ulcers on her mid right shin for about 2 years now, which weeps and heels and then weeps again. Right shin ulcer started weeping again about 3 weeks before. She also complains of pain on the heel ulcer which just started weeping two days before.  She doesn't elevate her legs at night but she says she knows she is supposed to do that. Also she is not using any compression stockings which she was prescribed a long time before, likely by wound care, due to her the concern of developing pressure rings at the ankles at night when she removes to stockings. She thinks that that might make her swelling worse than better. Explaineed her that should not be the case. She says she's applying antibiotic ointment to her ulcers regularly, but other than that she is not doing any other care for her chronic venous insufficiency as above. She drinks about 2-3 bottles of 2 L soda every week and says that she should cut down to one bottle a week. She doesn't like to drink diet soda. Denies any chest pain, cough, fever, chills, nausea, vomiting, headache, abdominal pain, diarrhea, recent weight loss, decreased appetite.     Review of Systems    as per history of present illness Objective:   Physical Exam    Constitutional: Vital signs reviewed.  Patient is morbidly obese, in no acute distress and cooperative with exam. Alert and oriented x3.  Head: Normocephalic and atraumatic Mouth: no erythema or exudates, MMM Eyes: PERRL, EOMI, conjunctivae normal, No scleral icterus.  Neck: Supple, Trachea midline normal ROM, No JVD Cardiovascular: RRR, S1 normal, S2  normal Pulmonary/Chest: CTAB, no wheezes, rales, or rhonchi Abdominal: Soft. Non-tender, non-distended, bowel sounds are normal, no masses, organomegaly, or guarding present.  GU: no CVA tenderness Musculoskeletal: No joint deformities, erythema, or stiffness, ROM full and no nontender Neurological: A&O x3, Strenght is normal and symmetric bilaterally, cranial nerve II-XII are grossly intact, no focal motor deficit, sensory intact to light touch bilaterally.  Skin: Right mid shin 5 x 5 mm weeping chronic venous stasis ulcer, lateral right heel small weeping ulcer. Scars of previous healed ulcers on left shin. Chronic venous insufficiency changes bilaterally, hyperpigmentation present. 2+ edema bilaterally, tense legs.     Assessment & Plan:

## 2010-05-26 NOTE — Telephone Encounter (Signed)
Agree

## 2010-05-26 NOTE — Assessment & Plan Note (Signed)
Using CPAP at home.

## 2010-05-26 NOTE — Patient Instructions (Signed)
Please make a followup appointment in 2-3 months. Please follow the instructions of the wound care and wear stockings as prescribed by them. Also elevated her legs above heart level when his sleep by keeping pillows. Also elevated her legs up on the table whenever you're sitting a sofa watching TV or doing something. Please try to lose weight and cutdown on your calories and soda and take as that would help a lot improving your leg swelling and ulcers.

## 2010-05-26 NOTE — Assessment & Plan Note (Addendum)
Open, weeping chronic venous stasis ulcers on right shin and heel. Mild to moderate tenderness on both ulcers. The right shin ulcer being present for many years now.  She had wound care before and so will refer back to the Wound Care clinic and explained her to follow their recommendations and wear the compression stockings which they prescribed him if she gets the ankle rings because that will help her fluid removed from the legs and will help the healing of ulcer quickly. Also explained her to elevate her legs above heart level at night by keeping pillows and also when she is sitting up in a sofa, elevate legs but keeping them on the resting table. Also discussed at length about losing weight and counting calories and decreasing soda intake for this, as it is really important for her to do that to get her chronic venous insufficiency better.

## 2010-05-27 NOTE — Progress Notes (Signed)
Left message on home phone recording doppler  sch at [facility] 05/28/10 3PM - order faxed with Medicaid auth number. Hilda Blades Miquel Lamson RN 05/27/10 4:45PM

## 2010-05-28 ENCOUNTER — Ambulatory Visit (HOSPITAL_COMMUNITY)
Admission: RE | Admit: 2010-05-28 | Discharge: 2010-05-28 | Disposition: A | Discharge status: Home / Self Care | Payer: Medicaid Other | Source: Ambulatory Visit | Attending: Internal Medicine | Admitting: Internal Medicine

## 2010-05-28 DIAGNOSIS — M79609 Pain in unspecified limb: Secondary | ICD-10-CM | POA: Insufficient documentation

## 2010-05-28 DIAGNOSIS — L97309 Non-pressure chronic ulcer of unspecified ankle with unspecified severity: Secondary | ICD-10-CM | POA: Insufficient documentation

## 2010-06-11 NOTE — Progress Notes (Signed)
Talked to [facility] [facility] and pt has appt with them 06/15/10 10AM. Hilda Blades Aymen Widrig RN 06/11/10 1:30PM

## 2010-06-15 ENCOUNTER — Encounter (HOSPITAL_BASED_OUTPATIENT_CLINIC_OR_DEPARTMENT_OTHER): Payer: Medicaid Other | Attending: Internal Medicine

## 2010-06-15 DIAGNOSIS — L03119 Cellulitis of unspecified part of limb: Secondary | ICD-10-CM | POA: Insufficient documentation

## 2010-06-15 DIAGNOSIS — L02419 Cutaneous abscess of limb, unspecified: Secondary | ICD-10-CM | POA: Insufficient documentation

## 2010-06-15 DIAGNOSIS — R609 Edema, unspecified: Secondary | ICD-10-CM | POA: Insufficient documentation

## 2010-06-15 DIAGNOSIS — L97809 Non-pressure chronic ulcer of other part of unspecified lower leg with unspecified severity: Secondary | ICD-10-CM | POA: Insufficient documentation

## 2010-06-15 DIAGNOSIS — Z79899 Other long term (current) drug therapy: Secondary | ICD-10-CM | POA: Insufficient documentation

## 2010-06-15 DIAGNOSIS — I872 Venous insufficiency (chronic) (peripheral): Secondary | ICD-10-CM | POA: Insufficient documentation

## 2010-06-16 NOTE — Assessment & Plan Note (Signed)
[facility]  NAME:  [name], [name]               ACCOUNT NO.:  [id number]  MEDICAL RECORD NO.:  [id number]      DATE OF BIRTH:  1960-01-23  PHYSICIAN:  Orlando Penner. Fabio Wah, M.D.  VISIT DATE:  06/15/2010                                  OFFICE VISIT   HISTORY:  This 50 year old mentally retarded white female who is previously known here was last treated and released here in August 2010 for venous stasis ulcers.  She tends toward heavy edema in her lower extremities and admits that she only wore her indicated compression stockings for 2-3 days following that last release.  She apparently in recent months has developed some additional cellulitis and blister and finally draining areas on both distal lower extremities, more so on the right than on the left.  She is referred here now by Dr. Posey Pronto to the [facility] for our assistance with her management.  The patient indicates that she has had no major illnesses since her last visit here and has had no hospitalizations or medication changes since that time.  PAST MEDICAL HISTORY:  Positive for surgeries to include cholecystectomy and hospitalizations in association with asthma in the distant past.  ALLERGIES:  She has medicinal allergies to ASPIRIN and SULFA.  REGULAR MEDICATIONS: 1. Omeprazole 20 mg b.i.d. 2. Valtrex 500 mg p.r.n. daily. 3. Multiple vitamin daily. 4. Ferrous gluconate. 5. Two puffs of albuterol as needed. 6. Lasix 20 mg daily.  FAMILY HISTORY:  Is not available in detail and is not only to have included hypertension in mother and no diabetes in either parent and also the patient has a son who is reportedly healthy.  PERSONAL HISTORY AND SOCIAL HISTORY:  The patient is single.  She has never smoked, uses no alcohol or drugs.  Does not exercise.  Lives with her son, is unemployed.  REVIEW OF SYSTEMS:  The patient is said to have some known peripheral vascular  disease, although that has not been clinically apparent at this time.  She also has chronic asthmatic bronchitis with COPD and ongoing cough and wheezing.  Her medical record indicates neutrophilia and thrombocytosis but she is on no medications for those at the moment. She has gastroesophageal reflux and is treated with omeprazole as indicated.  She has had some depression in the past, not currently on treatment.  She does use CPAP for her COPD and sleep apnea in association with her morbid obesity.  PHYSICAL EXAMINATION:  VITAL SIGNS:  Blood pressure is 128/77, pulse 89 and regular, respirations 18, temperature 98 degrees. GENERAL:  She is an obese white female appearing perhaps older than her stated age and obviously reflective of some degree of mental retardation although she is cooperative with the examiners to the extent she is able. SKIN:  Extensive acne scarring on the face, the yolk area of the trunk and also on both upper extremities. NECK:  Her neck veins are not distended with the patient sitting about 45 degrees.  Carotid pulses are present without bruits. CHEST:  Distant breath sounds and some rhonchi. HEART:  Heart tones are a fair quality, heart size is indeterminate. There is clearly an S4 but no other abnormality on auscultation. ABDOMEN:  Obese and hard to evaluate.  I cannot  tell if there is any splenomegaly, but there does not appear to be nor other masses or tenderness. EXTREMITIES:  Both grossly edematous with chronic stasis changes to the knees.  She also has areas of several acneform type lesions now healed on the proximal lower extremities.  The distal lower extremities there is considerable redness and erythema with some degree of increased skin temperature particularly in the right lower extremity.  There are no open lesions on the right and about the distal pretibial area.  An area of previous skin change is noted a small weeping ulcer measuring 0.8 x 1.0  x 0.1 cm.  IMPRESSION:  Venous stasis disease with ulceration and some degree of cellulitis now in both lower extremities.  DISPOSITION: 1. The open wound on the right is cultured. 2. The patient is begun on treatment with cephalexin dose 1 g twice     daily until these culture results are known.  Both extremities are     wrapped in Unna wraps with the right lower extremity having had     silver alginate applied over the open wound prior to this wrapping.  Followup visit [name] be here in 1 week.          ______________________________ Orlando Penner. London Pepper, M.D.     RES/MEDQ  D:  06/15/2010  T:  06/16/2010  Job:  [id number]

## 2010-06-17 ENCOUNTER — Ambulatory Visit (HOSPITAL_COMMUNITY)
Admission: RE | Admit: 2010-06-17 | Discharge: 2010-06-17 | Disposition: A | Discharge status: Home / Self Care | Payer: Medicaid Other | Source: Ambulatory Visit | Attending: Internal Medicine | Admitting: Internal Medicine

## 2010-06-17 DIAGNOSIS — I872 Venous insufficiency (chronic) (peripheral): Secondary | ICD-10-CM | POA: Insufficient documentation

## 2010-06-17 DIAGNOSIS — I8390 Asymptomatic varicose veins of unspecified lower extremity: Secondary | ICD-10-CM | POA: Insufficient documentation

## 2010-06-17 DIAGNOSIS — L97909 Non-pressure chronic ulcer of unspecified part of unspecified lower leg with unspecified severity: Secondary | ICD-10-CM | POA: Insufficient documentation

## 2010-06-17 DIAGNOSIS — M7989 Other specified soft tissue disorders: Secondary | ICD-10-CM | POA: Insufficient documentation

## 2010-06-17 DIAGNOSIS — M79609 Pain in unspecified limb: Secondary | ICD-10-CM

## 2010-06-29 ENCOUNTER — Encounter: Payer: Self-pay | Admitting: Internal Medicine

## 2010-06-30 ENCOUNTER — Encounter: Payer: Self-pay | Admitting: Internal Medicine

## 2010-06-30 ENCOUNTER — Ambulatory Visit (INDEPENDENT_AMBULATORY_CARE_PROVIDER_SITE_OTHER): Payer: Medicaid Other | Admitting: Internal Medicine

## 2010-06-30 ENCOUNTER — Other Ambulatory Visit (HOSPITAL_COMMUNITY)
Admission: RE | Admit: 2010-06-30 | Discharge: 2010-06-30 | Disposition: A | Discharge status: Home / Self Care | Payer: Medicaid Other | Source: Ambulatory Visit | Attending: Internal Medicine | Admitting: Internal Medicine

## 2010-06-30 VITALS — BP 143/71 | HR 86 | Temp 97.4°F | Wt 313.4 lb

## 2010-06-30 DIAGNOSIS — R0902 Hypoxemia: Secondary | ICD-10-CM

## 2010-06-30 DIAGNOSIS — G4733 Obstructive sleep apnea (adult) (pediatric): Secondary | ICD-10-CM

## 2010-06-30 DIAGNOSIS — R300 Dysuria: Secondary | ICD-10-CM

## 2010-06-30 DIAGNOSIS — Z01419 Encounter for gynecological examination (general) (routine) without abnormal findings: Secondary | ICD-10-CM | POA: Insufficient documentation

## 2010-06-30 DIAGNOSIS — J069 Acute upper respiratory infection, unspecified: Secondary | ICD-10-CM

## 2010-06-30 DIAGNOSIS — N390 Urinary tract infection, site not specified: Secondary | ICD-10-CM

## 2010-06-30 LAB — POCT URINALYSIS DIPSTICK
Bilirubin, UA: NEGATIVE
Blood, UA: NEGATIVE
Nitrite, UA: NEGATIVE
Spec Grav, UA: 1.02
pH, UA: 6

## 2010-06-30 MED ORDER — ALBUTEROL SULFATE (2.5 MG/3ML) 0.083% IN NEBU
2.5000 mg | INHALATION_SOLUTION | Freq: Once | RESPIRATORY_TRACT | Status: AC
  Administered 2010-06-30: 2.5 mg via RESPIRATORY_TRACT

## 2010-06-30 MED ORDER — IPRATROPIUM BROMIDE 0.02 % IN SOLN
0.5000 mg | RESPIRATORY_TRACT | Status: DC
  Administered 2010-06-30: 0.5 mg via RESPIRATORY_TRACT

## 2010-06-30 MED ORDER — ALBUTEROL SULFATE HFA 108 (90 BASE) MCG/ACT IN AERS: 2.0000 | INHALATION_SPRAY | Freq: Four times a day (QID) | RESPIRATORY_TRACT | Status: DC | PRN

## 2010-06-30 NOTE — Assessment & Plan Note (Signed)
Her symptoms are typical of UTI. The urine dipstick shows trace leukocytes otherwise normal. Pelvic exam is unremarkable. No abdominal tenderness or flank pain. Plan -Check UA and microscopic. -Pap smear, GC and Chlamydia. -Continue Levaquin until the prescription is finished. If symptoms have not resolved recheck in the clinic.

## 2010-06-30 NOTE — Progress Notes (Signed)
50 year old woman with past medical history of B12 deficiency anemia, chronic cough, obstructive sleep apnea comes to the clinic complaining of urinary frequency, burning and urgency. This has been going on since last week. About the same not getting worse. No pain in the abdomen no pain in the flanks no vaginal bleeding or discharge. No fevers chills or systemic signs of infection. She has cellulitis of her legs and has been prescribed levofloxacin by leg edema clinic which he has been taking for last 8 days and has 2 more tablets. She says that she is also going to get another prescription of levofloxacin for one more week. She has a history of genital herpes but has not seen any vesicles in her genital area and she said the symptoms are different from her usual genital herpes infection.  BP 143/71  Pulse 86  Temp(Src) 97.4 F (36.3 C) (Oral)  Wt 313 lb 6.4 oz (142.157 kg)  SpO2 94%  LMP 05/30/2010  General Appearance:    Alert, cooperative, no distress, appears stated age  Head:    Normocephalic, without obvious abnormality, atraumatic  Eyes:    PERRL, conjunctiva/corneas clear, EOM's intact, fundi    benign, both eyes  Ears:    Normal TM's and external ear canals, both ears  Nose:   Nares normal, septum midline, mucosa normal, no drainage    or sinus tenderness  Throat:   Lips, mucosa, and tongue normal; teeth and gums normal  Neck:   Supple, symmetrical, trachea midline, no adenopathy;    thyroid:  no enlargement/tenderness/nodules; no carotid   bruit or JVD  Back:     Symmetric, no curvature, ROM normal, no CVA tenderness  Lungs:     Clear to auscultation bilaterally, respirations unlabored  Chest Wall:    No tenderness or deformity   Heart:    Regular rate and rhythm, S1 and S2 normal, no murmur, rub   or gallop  Breast Exam:    No tenderness, masses, or nipple abnormality  Abdomen:     Soft, non-tender, bowel sounds active all four quadrants,    no masses, no organomegaly    Genitalia:    Normal female without lesion, discharge or tenderness, no herpetic vesicles. Pap smear obtained,no tenderness in the posterior fornix noted. No discharge      Extremities:   Extremities normal, atraumatic, no cyanosis or edema  Pulses:   2+ and symmetric all extremities  Skin:   Skin color, texture, turgor normal, no rashes or lesions  Lymph nodes:   Cervical, supraclavicular, and axillary nodes normal  Neurologic:   CNII-XII intact, normal strength, sensation and reflexes    throughout   Constitutional: Denies fever, chills, diaphoresis, appetite change and fatigue.  HEENT: Denies photophobia, eye pain, redness, hearing loss, ear pain, congestion, sore throat, rhinorrhea, sneezing, mouth sores, trouble swallowing, neck pain, neck stiffness and tinnitus.   Respiratory: Denies SOB, DOE, cough, chest tightness,  and wheezing.   Cardiovascular: Denies chest pain, palpitations and leg swelling.  Gastrointestinal: Denies nausea, vomiting, abdominal pain, diarrhea, constipation, blood in stool and abdominal distention.  Genitourinary: Denies dysuria, urgency, frequency, hematuria, flank pain and difficulty urinating.  Musculoskeletal: Denies myalgias, back pain, joint swelling, arthralgias and gait problem.  Skin: Denies pallor, rash and wound.  Neurological: Denies dizziness, seizures, syncope, weakness, light-headedness, numbness and headaches.  Hematological: Denies adenopathy. Easy bruising, personal or family bleeding history  Psychiatric/Behavioral: Denies suicidal ideation, mood changes, confusion, nervousness, sleep disturbance and agitation

## 2010-06-30 NOTE — Assessment & Plan Note (Signed)
Patient's oxygen saturation was 89% on room air. Stated that on the same high 80s low 90s with ambulation. Give her a breathing treatment and oxygen saturation was 97% She had a CT scan in 2010 showed bibasilar atelectasis and a bronchial thickening. She has severe obstructive sleep apnea and morbid obesity. She is not a smoker. She does not have any wheezing on exam. I will prescribe her albuterol inhaler for when necessary shortness of breath. She may need PFTs in the future but will hold on for now given active problems.

## 2010-06-30 NOTE — Patient Instructions (Signed)
Follow up in 2-3 months. Call me if your symptoms are not better after completing the course of antibiotics

## 2010-07-01 LAB — WET PREP BY MOLECULAR PROBE
Gardnerella vaginalis: NEGATIVE
Trichomonas vaginosis: NEGATIVE

## 2010-07-01 LAB — URINALYSIS, ROUTINE W REFLEX MICROSCOPIC
Ketones, ur: NEGATIVE mg/dL
Nitrite: NEGATIVE
Specific Gravity, Urine: 1.021 (ref 1.005–1.030)
Urobilinogen, UA: 1 mg/dL (ref 0.0–1.0)
pH: 6.5 (ref 5.0–8.0)

## 2010-07-01 LAB — URINALYSIS, MICROSCOPIC ONLY
Casts: NONE SEEN
Crystals: NONE SEEN

## 2010-07-06 ENCOUNTER — Encounter (HOSPITAL_BASED_OUTPATIENT_CLINIC_OR_DEPARTMENT_OTHER): Payer: Medicaid Other | Attending: Internal Medicine

## 2010-07-06 DIAGNOSIS — R609 Edema, unspecified: Secondary | ICD-10-CM | POA: Insufficient documentation

## 2010-07-06 DIAGNOSIS — L02419 Cutaneous abscess of limb, unspecified: Secondary | ICD-10-CM | POA: Insufficient documentation

## 2010-07-06 DIAGNOSIS — Z79899 Other long term (current) drug therapy: Secondary | ICD-10-CM | POA: Insufficient documentation

## 2010-07-06 DIAGNOSIS — L03119 Cellulitis of unspecified part of limb: Secondary | ICD-10-CM | POA: Insufficient documentation

## 2010-07-06 DIAGNOSIS — I872 Venous insufficiency (chronic) (peripheral): Secondary | ICD-10-CM | POA: Insufficient documentation

## 2010-07-06 DIAGNOSIS — L97809 Non-pressure chronic ulcer of other part of unspecified lower leg with unspecified severity: Secondary | ICD-10-CM | POA: Insufficient documentation

## 2010-07-10 ENCOUNTER — Other Ambulatory Visit: Payer: Self-pay | Admitting: *Deleted

## 2010-07-13 MED ORDER — PANTOPRAZOLE SODIUM 20 MG PO TBEC: 20.0000 mg | DELAYED_RELEASE_TABLET | Freq: Two times a day (BID) | ORAL | Status: DC

## 2010-07-14 ENCOUNTER — Ambulatory Visit (INDEPENDENT_AMBULATORY_CARE_PROVIDER_SITE_OTHER): Payer: Medicaid Other | Admitting: Dietician

## 2010-07-14 ENCOUNTER — Other Ambulatory Visit: Payer: Self-pay | Admitting: *Deleted

## 2010-07-14 DIAGNOSIS — E669 Obesity, unspecified: Secondary | ICD-10-CM

## 2010-07-14 DIAGNOSIS — R300 Dysuria: Secondary | ICD-10-CM

## 2010-07-14 NOTE — Telephone Encounter (Signed)
Quantity needs to be a 30 day supply.

## 2010-07-14 NOTE — Telephone Encounter (Signed)
Pt is here at the clinic; asking about urine test result. States she continues to have itching/ burning. Please advise.

## 2010-07-15 MED ORDER — PANTOPRAZOLE SODIUM 20 MG PO TBEC: 20.0000 mg | DELAYED_RELEASE_TABLET | Freq: Two times a day (BID) | ORAL | Status: DC

## 2010-07-15 NOTE — Telephone Encounter (Signed)
Pt was called; made awared of new tests ordered on urine specimen and Protonix rx sent to [facility].  Also informed to go to UC/ED or call the clinic if she experience flank pain, fever, or worsen symptoms per Dr. Lynnae January; she agreed.

## 2010-07-15 NOTE — Telephone Encounter (Signed)
Pt has been on Levaquin.  Will need to check urine cx for resistance  Will not treat now Had negative wet prep Will get results next day or so and Rx with ABX if needed. Otherwise, to ER / OPC / UC if febrile, flank pain, etc

## 2010-07-15 NOTE — Progress Notes (Signed)
Patient attended a 1 hour group session today on making healthy lifestyle changes. The discussion included learning how to avoid heat related problems when exercising, healthier food choices, weight loss/preparing foods with less fat and general healthy nutrition.

## 2010-08-03 NOTE — Telephone Encounter (Signed)
Pt is coming tomorrow at 1100AM for urine specimen.

## 2010-08-03 NOTE — Telephone Encounter (Signed)
Pt is coming tomorrow at 1100Am for appt-urine specimen.

## 2010-08-04 ENCOUNTER — Other Ambulatory Visit: Payer: Medicaid Other

## 2010-08-04 DIAGNOSIS — R300 Dysuria: Secondary | ICD-10-CM

## 2010-08-04 LAB — POCT URINALYSIS DIPSTICK
Bilirubin, UA: NEGATIVE
Glucose, UA: NEGATIVE
Ketones, UA: NEGATIVE
Nitrite, UA: NEGATIVE
Spec Grav, UA: 1.02
pH, UA: 5.5

## 2010-08-04 LAB — URINALYSIS, ROUTINE W REFLEX MICROSCOPIC
Bilirubin Urine: NEGATIVE
Ketones, ur: NEGATIVE mg/dL
Protein, ur: NEGATIVE mg/dL
Specific Gravity, Urine: 1.016 (ref 1.005–1.030)
Urobilinogen, UA: 0.2 mg/dL (ref 0.0–1.0)
pH: 5.5 (ref 5.0–8.0)

## 2010-08-04 NOTE — Telephone Encounter (Signed)
Addended by: Truddie Crumble on: 08/04/2010 11:23 AM   Modules accepted: Orders

## 2010-08-05 LAB — URINALYSIS, MICROSCOPIC ONLY
Casts: NONE SEEN
Crystals: NONE SEEN

## 2010-08-07 ENCOUNTER — Telehealth: Payer: Self-pay | Admitting: Internal Medicine

## 2010-08-07 LAB — URINE CULTURE: Colony Count: >=100000

## 2010-08-07 NOTE — Telephone Encounter (Signed)
Urine cx was + but was obtained about 2 weeks after the initial phone call when I approved the order bc pt had dysuria. Most sxs have resolved. Has itching on the inside but no freq, dysuria, temp. Will not treat the + Urine cx result.

## 2010-08-18 ENCOUNTER — Ambulatory Visit: Payer: Medicaid Other | Admitting: Dietician

## 2010-09-18 ENCOUNTER — Other Ambulatory Visit: Payer: Self-pay | Admitting: *Deleted

## 2010-09-18 MED ORDER — FUROSEMIDE 20 MG PO TABS: 20.0000 mg | ORAL_TABLET | Freq: Every day | ORAL | Status: DC

## 2010-10-09 LAB — POCT I-STAT, CHEM 8
Calcium, Ion: 1.06 mmol/L — ABNORMAL LOW (ref 1.12–1.32)
Chloride: 101 mEq/L (ref 96–112)
Creatinine, Ser: 0.7 mg/dL (ref 0.4–1.2)
Glucose, Bld: 93 mg/dL (ref 70–99)
Potassium: 4.1 mEq/L (ref 3.5–5.1)

## 2010-10-09 LAB — CBC
HCT: 34.5 % — ABNORMAL LOW (ref 36.0–46.0)
Hemoglobin: 10.3 g/dL — ABNORMAL LOW (ref 12.0–15.0)
MCHC: 29.9 g/dL — ABNORMAL LOW (ref 30.0–36.0)
RBC: 4.82 MIL/uL (ref 3.87–5.11)
RDW: 20.9 % — ABNORMAL HIGH (ref 11.5–15.5)

## 2010-10-09 LAB — DIFFERENTIAL
Eosinophils Relative: 0 % (ref 0–5)
Lymphocytes Relative: 24 % (ref 12–46)
Monocytes Absolute: 0.9 10*3/uL (ref 0.1–1.0)
Monocytes Relative: 7 % (ref 3–12)
Neutro Abs: 8 10*3/uL — ABNORMAL HIGH (ref 1.7–7.7)

## 2010-10-09 LAB — B-NATRIURETIC PEPTIDE (CONVERTED LAB): Pro B Natriuretic peptide (BNP): <30 pg/mL (ref 0.0–100.0)

## 2010-10-12 ENCOUNTER — Encounter (HOSPITAL_COMMUNITY): Payer: Self-pay | Admitting: *Deleted

## 2010-10-12 ENCOUNTER — Telehealth: Payer: Self-pay | Admitting: *Deleted

## 2010-10-12 ENCOUNTER — Inpatient Hospital Stay (HOSPITAL_COMMUNITY)
Admission: AD | Admit: 2010-10-12 | Discharge: 2010-10-12 | Disposition: A | Discharge status: Home / Self Care | Payer: Medicaid Other | Source: Ambulatory Visit | Attending: Obstetrics & Gynecology | Admitting: Obstetrics & Gynecology

## 2010-10-12 DIAGNOSIS — R739 Hyperglycemia, unspecified: Secondary | ICD-10-CM

## 2010-10-12 DIAGNOSIS — B373 Candidiasis of vulva and vagina: Secondary | ICD-10-CM | POA: Insufficient documentation

## 2010-10-12 DIAGNOSIS — L293 Anogenital pruritus, unspecified: Secondary | ICD-10-CM | POA: Insufficient documentation

## 2010-10-12 DIAGNOSIS — R7309 Other abnormal glucose: Secondary | ICD-10-CM | POA: Insufficient documentation

## 2010-10-12 DIAGNOSIS — B3731 Acute candidiasis of vulva and vagina: Secondary | ICD-10-CM | POA: Insufficient documentation

## 2010-10-12 DIAGNOSIS — B379 Candidiasis, unspecified: Secondary | ICD-10-CM

## 2010-10-12 LAB — URINE MICROSCOPIC-ADD ON

## 2010-10-12 LAB — URINALYSIS, ROUTINE W REFLEX MICROSCOPIC
Nitrite: NEGATIVE
Specific Gravity, Urine: 1.01 (ref 1.005–1.030)
Urobilinogen, UA: 0.2 mg/dL (ref 0.0–1.0)

## 2010-10-12 LAB — POCT PREGNANCY, URINE: Preg Test, Ur: NEGATIVE

## 2010-10-12 MED ORDER — FLUCONAZOLE 200 MG PO TABS
200.0000 mg | ORAL_TABLET | Freq: Once | ORAL | Status: AC
  Administered 2010-10-12: 200 mg via ORAL
  Filled 2010-10-12: qty 1

## 2010-10-12 MED ORDER — FLUCONAZOLE 150 MG PO TABS: 150.0000 mg | ORAL_TABLET | Freq: Every day | ORAL | Status: AC

## 2010-10-12 MED ORDER — NYSTATIN-TRIAMCINOLONE 100000-0.1 UNIT/GM-% EX OINT: TOPICAL_OINTMENT | Freq: Two times a day (BID) | CUTANEOUS | Status: AC

## 2010-10-12 NOTE — ED Provider Notes (Signed)
History     CSN: [id number] Arrival date & time: 10/12/2010 12:21 PM  Chief Complaint  Patient presents with  . Vaginal Itching    HPI [name] is a 50 y.o. female who presents to [facility] for vaginal itching and burning on urination.  Symptoms started 2 weeks ago and have gotten progressively worse.  She is not sexually active "for a long time". Hx of HSV. She is under the care of doctors in the [facility] for multiple chronic illnesses. She also has her pap smears done there. She is disabled. The history was provided by the patient.    Past Medical History  Diagnosis Date  . OSA (obstructive sleep apnea)     CPAP  . Venous stasis ulcer     chornic, ?followed up at wound care center, multiple courses of antibiotics in past for cellulitis, on lasix  . GERD (gastroesophageal reflux disease)   . Anemia, iron deficiency     secondary to menhorrhagia, on oral iron, also b12 def, getting monthly b12 shots  . Chronic cough     secondary to alleriges and post nasal drip  . Depression     No past surgical history on file.  No family history on file.  History  Substance Use Topics  . Smoking status: Former Research scientist (life sciences)  . Smokeless tobacco: Not on file  . Alcohol Use: Not on file    OB History    Grav Para Term Preterm Abortions TAB SAB Ect Mult Living                  Review of Systems  Constitutional: Negative for fever and chills.       Obese  HENT: Negative.   Respiratory: Negative.   Cardiovascular: Positive for leg swelling.  Gastrointestinal: Negative for nausea, vomiting and abdominal pain.  Genitourinary: Positive for dysuria, vaginal discharge and vaginal pain. Negative for frequency, vaginal bleeding, difficulty urinating and pelvic pain.  Musculoskeletal: Negative for back pain.  Psychiatric/Behavioral:       Depression    Allergies  Aspirin; Codeine; and Sulfonamide derivatives  Home Medications  No current outpatient prescriptions on  file.  BP 146/83  Pulse 93  Temp(Src) 98.1 F (36.7 C) (Oral)  Resp 16  Ht 5' 3.5" (1.613 m)  Wt 293 lb 6 oz (133.074 kg)  BMI 51.15 kg/m2  Physical Exam  Nursing note and vitals reviewed. Constitutional: She is oriented to person, place, and time. No distress.       Obese white female   HENT:  Head: Normocephalic.  Eyes: EOM are normal.  Neck: Neck supple.  Cardiovascular: Normal rate.   Pulmonary/Chest: Effort normal.       Rash consistent with monilia under breast.  Abdominal: Soft. There is no tenderness.  Genitourinary:       External genitalia with swelling, erythema, excoriated. The erythema extends from the mons pubis to the rectal area. The mucous membranes of the labia major and minor are swollen with erythema. Vaginal vault thick white discharge. No CMT, no adnexal tenderness.   Musculoskeletal:       Lower extremities with swelling and chronic venous stasis.   Neurological: She is alert and oriented to person, place, and time. No cranial nerve deficit.  Skin:       Venous stasis ulcers, chronic bilateral lower extremities.    Results for orders placed during the hospital encounter of 10/12/10 (from the past 24 hour(s))  URINALYSIS, ROUTINE W REFLEX  MICROSCOPIC     Status: Abnormal   Collection Time   10/12/10 12:50 PM      Component Value Range   Color, Urine YELLOW  YELLOW    Appearance CLEAR  CLEAR    Specific Gravity, Urine 1.010  1.005 - 1.030    pH 5.5  5.0 - 8.0    Glucose, UA >1000 (*) NEGATIVE (mg/dL)   Hgb urine dipstick NEGATIVE  NEGATIVE    Bilirubin Urine NEGATIVE  NEGATIVE    Ketones, ur NEGATIVE  NEGATIVE (mg/dL)   Protein, ur NEGATIVE  NEGATIVE (mg/dL)   Urobilinogen, UA 0.2  0.0 - 1.0 (mg/dL)   Nitrite NEGATIVE  NEGATIVE    Leukocytes, UA TRACE (*) NEGATIVE   URINE MICROSCOPIC-ADD ON     Status: Normal   Collection Time   10/12/10 12:50 PM      Component Value Range   Squamous Epithelial / LPF RARE  RARE    WBC, UA 3-6  <3 (WBC/hpf)    Bacteria, UA RARE  RARE   POCT PREGNANCY, URINE     Status: Normal   Collection Time   10/12/10 12:53 PM      Component Value Range   Preg Test, Ur NEGATIVE    GLUCOSE, CAPILLARY     Status: Abnormal   Collection Time   10/12/10  2:33 PM      Component Value Range   Glucose-Capillary 366 (*) 70 - 99 (mg/dL)    ED Course: I spoke with Baker Janus, RN at [facility] and she has scheduled the patient for a follow up visit on Wed. October 10th a 10:45am.    Procedures  Assessment:  Monilia   Hyperglycemia  Plan:  Diflucan 200 mg. Po now   Mycolog II cream   Follow up with Out Patient Clinic on Wed.       [place], [place] 10/12/10 [phone]

## 2010-10-12 NOTE — Telephone Encounter (Signed)
Agree with appt.

## 2010-10-12 NOTE — Progress Notes (Signed)
Pt states that a month ago her reg MD-at [name]-she doesn't remember what he told her because she still has the itchng and burning present in her vagina and rectum

## 2010-10-12 NOTE — Telephone Encounter (Signed)
Call from [facility], NP at [facility] stating she is seeing pt for vaginal itching for 2 weeks.  On exam pt has yeast infection to perineal area which is being treated today.  It was also noted pt had a urine glucose over 1,000 and serum glucose of 336.  No Hx of DM  Pt given appointment for Wed 10/10 at 1045. NP to tell pt.

## 2010-10-12 NOTE — Progress Notes (Signed)
Pt states pain with urination/vaginal itching x5-6 weeks. Thought was yeast infection, unsure if it is soap/body wash being used. HSV outbreak, unsure if has gone away. Also has intermittent constipation, ? Hemorrhoids, rectal bleeding noted with straining/bm's. Vagina swollen.

## 2010-10-14 ENCOUNTER — Ambulatory Visit (INDEPENDENT_AMBULATORY_CARE_PROVIDER_SITE_OTHER): Payer: Medicaid Other | Admitting: Internal Medicine

## 2010-10-14 DIAGNOSIS — E785 Hyperlipidemia, unspecified: Secondary | ICD-10-CM

## 2010-10-14 DIAGNOSIS — E669 Obesity, unspecified: Secondary | ICD-10-CM | POA: Insufficient documentation

## 2010-10-14 DIAGNOSIS — R7309 Other abnormal glucose: Secondary | ICD-10-CM

## 2010-10-14 DIAGNOSIS — F329 Major depressive disorder, single episode, unspecified: Secondary | ICD-10-CM

## 2010-10-14 DIAGNOSIS — I83009 Varicose veins of unspecified lower extremity with ulcer of unspecified site: Secondary | ICD-10-CM

## 2010-10-14 DIAGNOSIS — R739 Hyperglycemia, unspecified: Secondary | ICD-10-CM

## 2010-10-14 DIAGNOSIS — E119 Type 2 diabetes mellitus without complications: Secondary | ICD-10-CM

## 2010-10-14 DIAGNOSIS — D518 Other vitamin B12 deficiency anemias: Secondary | ICD-10-CM

## 2010-10-14 DIAGNOSIS — L97909 Non-pressure chronic ulcer of unspecified part of unspecified lower leg with unspecified severity: Secondary | ICD-10-CM

## 2010-10-14 HISTORY — DX: Obesity, unspecified: E66.9

## 2010-10-14 LAB — CBC
HCT: 45.1 % (ref 36.0–46.0)
Hemoglobin: 13.9 g/dL (ref 12.0–15.0)
MCH: 28 pg (ref 26.0–34.0)
MCV: 90.7 fL (ref 78.0–100.0)
Platelets: 409 10*3/uL — ABNORMAL HIGH (ref 150–400)
RDW: 17.9 % — ABNORMAL HIGH (ref 11.5–15.5)
WBC: 9.2 10*3/uL (ref 4.0–10.5)

## 2010-10-14 NOTE — Progress Notes (Deleted)
  Subjective:    [name], female    DOB: 1960/12/09, 51 y.o.   MRN: [id number]  HPI    Review of Systems     Objective:   Physical Exam        Assessment & Plan:

## 2010-10-14 NOTE — Patient Instructions (Signed)
1.Please make an appointment with [name] Plyler 2. Control your diet and exercise on a regular office

## 2010-10-14 NOTE — Progress Notes (Signed)
Subjective:   [name]   DOB: 1960/04/29 50 y.o.   MRN: [id number]  HPI: [name] is a 50 y.o. female who presented to the clinic for an  elevated  Blood glucose level at the the [facility]. She noted that she was having increased frequency of east infection in the last couple of month and she was evaluated for possible DM. Her blood glucose level was above 400 at the time.  She reports she just had a fruit cake and orange juice on that day and today she ate a cinnamon bun and caprison. Patient further noted that she significant swelling of her legs but this has been chronic.    Past Medical History  Diagnosis Date  . OSA (obstructive sleep apnea)     CPAP  . Venous stasis ulcer     chornic, ?followed up at wound care center, multiple courses of antibiotics in past for cellulitis, on lasix  . GERD (gastroesophageal reflux disease)   . Anemia, iron deficiency     secondary to menhorrhagia, on oral iron, also b12 def, getting monthly b12 shots  . Chronic cough     secondary to alleriges and post nasal drip  . Depression    Current Outpatient Prescriptions  Medication Sig Dispense Refill  . albuterol (PROVENTIL HFA;VENTOLIN HFA) 108 (90 BASE) MCG/ACT inhaler Inhale 2 puffs into the lungs every 6 (six) hours as needed for wheezing.  1 Inhaler  6  . ferrous gluconate (FERGON) 325 MG tablet Take 325 mg by mouth daily with breakfast.        . fluconazole (DIFLUCAN) 150 MG tablet Take 1 tablet (150 mg total) by mouth daily.  7 tablet  0  . furosemide (LASIX) 20 MG tablet Take 1 tablet (20 mg total) by mouth daily.  31 tablet  1  . levofloxacin (LEVAQUIN) 500 MG tablet Take 500 mg by mouth daily. prescriped by leg edema clinic for possbile cellulitis, total 10 days course of Abx planned. Patient has 2 more days of antibiotcs left.       . nystatin-triamcinolone (MYCOLOG) ointment Apply topically 2 (two) times daily.  60 g  0  . pantoprazole (PROTONIX) 20  MG tablet Take 1 tablet (20 mg total) by mouth 2 (two) times daily.  62 tablet  11  . vitamin B-12 (CYANOCOBALAMIN) 250 MCG tablet Take 250 mcg by mouth daily.         Current Facility-Administered Medications  Medication Dose Route Frequency Provider Last Rate Last Dose  . ipratropium (ATROVENT) 0.02 % nebulizer solution 0.5 mg  0.5 mg Nebulization 1 day or 1 dose Madhav Devani   0.5 mg at 06/30/10 1456   Review of Systems:  Constitutional: Denies fever, chills, diaphoresis, appetite change and fatigue.  HEENT: Denies photophobia, trouble swallowing, neck pain, neck stiffness and tinnitus.   Respiratory: Denies SOB, DOE, cough, chest tightness,  and wheezing.   Cardiovascular: Denies chest pain, palpitations and leg swelling.  Gastrointestinal: Denies nausea, vomiting, abdominal pain, diarrhea, constipation, blood in stool and abdominal distention.  Genitourinary: Denies dysuria, urgency, frequency, hematuria, flank pain and difficulty urinating.  Musculoskeletal: Denies myalgias, back pain, joint swelling, arthralgias and gait problem.  Neurological: Denies dizziness, seizures, syncope, weakness, light-headedness, numbness and headaches.    Objective:  Physical Exam:  Constitutional: Vital signs reviewed.  Patient is a well-developed and well-nourished in no acute distress and cooperative with exam. Alert and oriented x3.   Neck: Supple, Trachea midline  normal ROMCardiovascular: RRR, S1 normal, S2 normal, no MRG, pulses symmetric and intact bilaterally Pulmonary/Chest: CTAB, no wheezes, rales, or rhonchi Abdominal: Soft. Non-tender, obese, bowel sounds are normal, no masses, organomegaly, or guarding present.  GU: no CVA tenderness Musculoskeletal: significant venous stasis changes noted bilaterally on lower extremities. Hematology: no cervical, inginal, or axillary adenopathy.  Neurological: A&O x3, no focal motor deficit, sensory intact to light touch bilaterally.

## 2010-10-15 DIAGNOSIS — E119 Type 2 diabetes mellitus without complications: Secondary | ICD-10-CM | POA: Insufficient documentation

## 2010-10-15 DIAGNOSIS — E785 Hyperlipidemia, unspecified: Secondary | ICD-10-CM | POA: Insufficient documentation

## 2010-10-15 LAB — BASIC METABOLIC PANEL
BUN: 10 mg/dL (ref 6–23)
Calcium: 8.8 mg/dL (ref 8.4–10.5)
Chloride: 96 mEq/L (ref 96–112)
Creat: 0.64 mg/dL (ref 0.50–1.10)
Glucose, Bld: 407 mg/dL — ABNORMAL HIGH (ref 70–99)

## 2010-10-15 LAB — HEMOGLOBIN A1C
Hgb A1c MFr Bld: 13.5 % — ABNORMAL HIGH (ref <5.7)
Mean Plasma Glucose: 341 mg/dL — ABNORMAL HIGH (ref <117)

## 2010-10-15 LAB — LIPID PANEL
Cholesterol: 181 mg/dL (ref 0–200)
HDL: 36 mg/dL — ABNORMAL LOW (ref >39)
LDL Cholesterol: 117 mg/dL — ABNORMAL HIGH (ref 0–99)
Total CHOL/HDL Ratio: 5 ratio
Triglycerides: 139 mg/dL (ref <150)
VLDL: 28 mg/dL (ref 0–40)

## 2010-10-15 MED ORDER — PRAVASTATIN SODIUM 20 MG PO TABS: 20.0000 mg | ORAL_TABLET | Freq: Every day | ORAL | Status: DC

## 2010-10-15 MED ORDER — METFORMIN HCL 500 MG PO TABS: 500.0000 mg | ORAL_TABLET | Freq: Two times a day (BID) | ORAL | Status: DC

## 2010-10-17 NOTE — Assessment & Plan Note (Signed)
Patient was noted to have elevated Lipids. In the setting of DM, Hypertension and Obesity I will start statin. Will check LFT in 4- 6 weeks.

## 2010-10-17 NOTE — Assessment & Plan Note (Signed)
Will check CBC today.

## 2010-10-17 NOTE — Assessment & Plan Note (Signed)
Patient was found to have DM with Hgb A1c of 13.5. Will start with metformin 500 mg BID. Evaluate in 2 weeks about tolerance and likely need increase of Metformin and may need another Anti-diabetic medication. Patient is referred to [facility] for further education and help.

## 2010-10-17 NOTE — Assessment & Plan Note (Addendum)
Will check for diabetes with Hgb A1c and get other labs since patient has not been seen since 06/2010. We discussed changes in diet and exercise . Patient would like to have more help to discuss about diet control. I will refer patient to [facility] for further education and help.

## 2010-10-19 ENCOUNTER — Telehealth: Payer: Self-pay | Admitting: *Deleted

## 2010-10-19 NOTE — Telephone Encounter (Signed)
Pt calls and states she thinks the md called her on thurs of last week and told her she would need to be seen earlier than her scheduled appt in dec. After reviewing chart she is given appt per chilonb. 10/22 at 1045, pt is called and told 1045 instead of 1545 that day per chilonb. This appt will be f/u on diab diagnosis

## 2010-10-26 ENCOUNTER — Ambulatory Visit: Payer: Medicaid Other | Admitting: Dietician

## 2010-10-26 ENCOUNTER — Ambulatory Visit (INDEPENDENT_AMBULATORY_CARE_PROVIDER_SITE_OTHER): Payer: Medicaid Other | Admitting: Internal Medicine

## 2010-10-26 ENCOUNTER — Ambulatory Visit (INDEPENDENT_AMBULATORY_CARE_PROVIDER_SITE_OTHER): Payer: Medicaid Other | Admitting: Dietician

## 2010-10-26 ENCOUNTER — Other Ambulatory Visit: Payer: Self-pay | Admitting: Internal Medicine

## 2010-10-26 ENCOUNTER — Encounter: Payer: Self-pay | Admitting: Internal Medicine

## 2010-10-26 DIAGNOSIS — G4733 Obstructive sleep apnea (adult) (pediatric): Secondary | ICD-10-CM

## 2010-10-26 DIAGNOSIS — E119 Type 2 diabetes mellitus without complications: Secondary | ICD-10-CM

## 2010-10-26 LAB — GLUCOSE, CAPILLARY: Glucose-Capillary: 198 mg/dL — ABNORMAL HIGH (ref 70–99)

## 2010-10-26 MED ORDER — METFORMIN HCL 500 MG PO TABS: 1000.0000 mg | ORAL_TABLET | Freq: Two times a day (BID) | ORAL | Status: DC

## 2010-10-26 NOTE — Patient Instructions (Signed)
Please follow up in 2-4 weeks. Please continue to take all medications as prescribed.

## 2010-10-26 NOTE — Progress Notes (Signed)
Diabetes Self-Management Training (DSMT)  Initial Visit  10/26/2010 [name], identified by name and date of birth, is a 50 y.o. female with Type 2 Diabetes. Year of diabetes diagnosis: 10-2010 Other persons present: no  ASSESSMENT Patient concerns are Nutrition/meal planning, Monitoring and physical activity.  There were no vitals taken for this visit. There is no height or weight on file to calculate BMI. Lab Results  Component Value Date   LDLCALC 117* 10/14/2010   Lab Results  Component Value Date   HGBA1C 13.5* 10/14/2010   Medication Nutrition Monitor: none and does not desire to check blood sugar which is appropriate at this point  Labs reviewed.  DIABETES BUNDLE: A1C in past 6 months? Yes.  Less than 7%? No LDL in past year? Yes.  Less than 100 mg/dL? No Microalbumin ratio in past year? No. Patient taking ACE or ARB? No.  Sent note to MD. Blood pressure less than 130/80? Yes. Foot exam in last year? No. Sent note to MD. Eye exam in past year? Yes. Tobacco use? No. Pneumovax? No Flu vaccine? No Asprin? No  Family history of diabetes: No Patients belief/attitude about diabetes: Diabetes can be controlled. Self foot exams daily: No Diabetes Complications: None Support systems: clergy Special needs: Large print, Simplified materials Prior DM Education: No   Medications See Medications list.  Is interested in learning more   Exercise Plan Doing ADLs and walking for 10 minutesa day.   Self-Monitoring Frequency of testing: not testing and this is appropriate she desires to keep track of A1C and weight to self monitor   Hyperglycemia: Yes Daily Hypoglycemia: No   Meal Planning Some knowledge   Assessment comments: patient easily confused. Kept food record and tried to count carbs but some misconceptions about sugar vs carbs.    INDIVIDUAL DIABETES EDUCATION PLAN:  Nutrition management Physical activity and exercise Monitoring Acute  complication: _______________________________________________________________________  Intervention TOPICS COVERED TODAY:  Nutrition management  Role of diet in the treatment of diabetes and the relationship between the three main macronutritents and blood glucose control. Information on hints to eating out and maintain blood glucose control. Physical activity and exercise  Role of exercise on diabetes management, blood pressure control and cardiac health. Helped patient identify appropriate exercises in relation to his/her diabetes, diabetes complications and other health issue. Monitoring  Identified appropriate SMBG and A1C goals. Acute complication  Discussed and identified patients' treatment of hyperglycemia.  PATIENTS GOALS/PLAN (copy and paste in patient instructions so patient receives a copy): 1.  Learning Objective:       Know what to do to lower blood sugar 2.  Behavioral Objective:         Nutrition: To improve blood glucose control I will follow meal plan of half a plate fo vegetables and 1/4 starch and 1/4 protein with fruit and lowfat milk Sometimes 25% Physical Activity: For improved blood glucose control and decrease insulin resistance, I will exercise 15 minutes 2 days a week   Sometimes 25% Monitoring: To identify blood glucose trends, I will always know A1C Never 0% Reducing Risk: To decrease the risk for complications, I will   Never 0%  Personalized Follow-Up Plan for Ongoing Self Management Support:  church, family and CDE visits ______________________________________________________________________   Outcomes Expected outcomes: Demonstrated interest in learning.Expect positive changes in lifestyle.  Self-care Barriers: Hard of hearing, Low literacy, Lack of material resources, Coping skills  Education material provided: living with diabetes simple booklet for Type 2 diabetes  Patient  to contact team via Phone if problems or questions.  Time in: 1100     Time  out: 1200  Future DSMT - 4-6 wks   Jaleiyah Alas, [name]

## 2010-10-26 NOTE — Progress Notes (Signed)
  Subjective:    [name], female    DOB: [birth year], 50 y.o.   MRN: [id number]  HPI [name] is a 50 year old woman with pmh significant for recently diagnosed DM, Mental retardation, HLD and GERD who presents for 2 week follow-up.   1.) DM - A1C 13.5. Pt recently started Metformin 500 mg po BID. Pt does not have a meter yet. Patient is tolerating medicine well. She needs some assistance with nutrition and diet. No symptoms related to hyper or hypoglycemia. No N/V, sob, dizziness or lightheadedness, or polyuria.   Patient has no other complaints or concerns today. She denies chest pain, cough, sob, headache, N/V, changes in abdominal and urinary character.    Review of Systems  All other systems reviewed and are negative.       Objective:   Physical Exam  Constitutional: She is oriented to person, place, and time. She appears well-developed.  HENT:  Head: Normocephalic.  Neck: Normal range of motion. Neck supple.  Cardiovascular: Normal rate and regular rhythm.   Pulmonary/Chest: Effort normal.  Abdominal: Soft.  Musculoskeletal: Normal range of motion.  Neurological: She is alert and oriented to person, place, and time.  Psychiatric: She has a normal mood and affect.          Assessment & Plan:

## 2010-10-26 NOTE — Assessment & Plan Note (Signed)
Lab Results  Component Value Date   HGBA1C 13.5* 10/14/2010   CREATININE 0.64 10/14/2010   CREATININE 0.55 07/15/2008   CHOL 181 10/14/2010   HDL 36* 10/14/2010   TRIG 139 10/14/2010    Last eye exam and foot exam: No results found for this basename: HMDIABEYEEXA, HMDIABFOOTEX    Assessment: Diabetes control: not controlled Progress toward goals: unable to assess Barriers to meeting goals: lack of understanding of disease management  Plan: Diabetes treatment: continue current medications Will increase Metformin to 1000 mg po bid Refer to: diabetes educator for self-management training, diabetes educator for medical nutrition therapy and nutritionist Instruction/counseling given: reminded to bring blood glucose meter & log to each visit, reminded to bring medications to each visit, discussed the need for weight loss and discussed diet

## 2010-10-27 ENCOUNTER — Encounter: Payer: Medicaid Other | Admitting: Internal Medicine

## 2010-11-04 ENCOUNTER — Other Ambulatory Visit: Payer: Self-pay | Admitting: Internal Medicine

## 2010-11-10 ENCOUNTER — Encounter: Payer: Self-pay | Admitting: Dietician

## 2010-11-10 ENCOUNTER — Ambulatory Visit (INDEPENDENT_AMBULATORY_CARE_PROVIDER_SITE_OTHER): Payer: Medicaid Other | Admitting: Dietician

## 2010-11-10 DIAGNOSIS — Z23 Encounter for immunization: Secondary | ICD-10-CM

## 2010-11-10 DIAGNOSIS — E119 Type 2 diabetes mellitus without complications: Secondary | ICD-10-CM

## 2010-11-10 MED ORDER — PNEUMOCOCCAL VAC POLYVALENT 25 MCG/0.5ML IJ INJ: 0.5000 mL | INJECTION | Freq: Once | INTRAMUSCULAR | Status: DC

## 2010-11-10 NOTE — Progress Notes (Signed)
Diabetes Self-Management Training (DSMT)  2nd visit  11/10/2010 [name], identified by name and date of birth, is a 50 y.o. female with Type 2 Diabetes. Year of diabetes diagnosis: 10-2010 Other persons present: no  ASSESSMENT Patient concerns are Nutrition/meal planning, Monitoring and physical activity.  Height _0  (1.6 m), weight 290 lb 3.2 oz (131.634 kg). Body mass index is 51.41 kg/(m^2). Lab Results  Component Value Date   LDLCALC 117* 10/14/2010   Lab Results  Component Value Date   HGBA1C 13.5* 10/14/2010   Medication Nutrition Monitor: none, blood sugar 124 after large breakfast and small candy bar for snack today.  Labs reviewed.  DIABETES BUNDLE: A1C in past 6 months? Yes.  Less than 7%? No LDL in past year? Yes.  Less than 100 mg/dL? No Microalbumin ratio in past year? No. Patient taking ACE or ARB? No.  Sent note to MD. Blood pressure less than 130/80? Yes. Foot exam in last year? No. Done today. Eye exam in past year? Yes. Tobacco use? No. Pneumovax? No- done today Flu vaccine? No- done today Asprin? No  Family history of diabetes: No Patients belief/attitude about diabetes: Diabetes can be controlled. Self foot exams daily: No Diabetes Complications: None Support systems: clergy Special needs: Large print, Simplified materials Prior DM Education:Yes   Medications See Medications list.  Is interested in learning more, taking as prescribed   Exercise Plan Doing ADLs and walking for 10 minutesa day.   Self-Monitoring Frequency of testing: not testing and this is appropriate she desires to keep track of A1C and weight to self monitor   Hyperglycemia: No Hypoglycemia: No   Meal Planning Some knowledge   Assessment comments: patient still verbalizing confusion about counting carb grams. Better understanding today than at past appointment. Eats large amounts of high fat food choices.     INDIVIDUAL DIABETES EDUCATION PLAN:    Nutrition management Physical activity and exercise Monitoring Acute complication: _______________________________________________________________________  Intervention TOPICS COVERED TODAY:  Nutrition: reviewed carbohydrates portions and introduced concept of low fat food choices.  Exercise- encouraged walking in mall since patient says cold bothers her.  Chronic complications- foot exam and foot care education done today  PATIENTS GOALS/PLAN (copy and paste in patient instructions so patient receives a copy): 1.  Learning Objective:       Know what to do to lower blood sugar 2.  Behavioral Objective:         Nutrition: To improve blood glucose control I will follow meal plan of half a plate fo vegetables  and 1/4 starch and 1/4 protein with fruit and lowfat milk Sometimes 75%  Physical Activity: For improved blood glucose control and decrease insulin resistance, I will  exercise 15 minutes 2 days a week   Sometimes 75%  Monitoring: To identify blood glucose trends, I will always know A1C Never 50%  Reducing Risk: To decrease the risk for complications, I will check my feet once a day     Never 0%  Personalized Follow-Up Plan for Ongoing Self Management Support:  church, family and CDE visits ______________________________________________________________________   Outcomes Expected outcomes: Demonstrated interest in learning.Expect positive changes in lifestyle.  Self-care Barriers: Hard of hearing, Low literacy, Lack of material resources, Coping skills  Education material provided: Picture handout of carb portions and amounts patient should be consuming at meals and snacks  Patient to contact team via Phone if problems or questions.  Time in: 1100     Time out: 1230  Future DSMT -  4-6 wks   Ahmod Gillespie, [name]

## 2010-11-10 NOTE — Patient Instructions (Signed)
You are doing a good job loosing weight by limiting your portions and exercising.  You have lost 2 pounds and your blood sugar today was 124.   For the next few weeks I will start looking at fat on labels- try to limit it to 5 grams or less per servings.  Limit to ONE sweet a day from : candy, cookie, cake, ice cream  Use FRUIT for other sweets.  Eat as much vegetables and low fat meats: fish, [place], chicken, tuna, eggs : as you want.   Limit bacon, sausage and fries to one of these one time per week.

## 2010-12-04 ENCOUNTER — Encounter: Payer: Self-pay | Admitting: Internal Medicine

## 2010-12-04 ENCOUNTER — Ambulatory Visit (INDEPENDENT_AMBULATORY_CARE_PROVIDER_SITE_OTHER): Payer: Medicaid Other | Admitting: Internal Medicine

## 2010-12-04 ENCOUNTER — Other Ambulatory Visit: Payer: Self-pay | Admitting: Internal Medicine

## 2010-12-04 VITALS — BP 129/71 | HR 94 | Temp 96.9°F | Ht 63.0 in | Wt 284.2 lb

## 2010-12-04 DIAGNOSIS — E119 Type 2 diabetes mellitus without complications: Secondary | ICD-10-CM

## 2010-12-04 DIAGNOSIS — G4733 Obstructive sleep apnea (adult) (pediatric): Secondary | ICD-10-CM

## 2010-12-04 DIAGNOSIS — I83009 Varicose veins of unspecified lower extremity with ulcer of unspecified site: Secondary | ICD-10-CM

## 2010-12-04 DIAGNOSIS — E785 Hyperlipidemia, unspecified: Secondary | ICD-10-CM

## 2010-12-04 LAB — COMPREHENSIVE METABOLIC PANEL
ALT: 36 U/L — ABNORMAL HIGH (ref 0–35)
AST: 39 U/L — ABNORMAL HIGH (ref 0–37)
Alkaline Phosphatase: 105 U/L (ref 39–117)
CO2: 29 mEq/L (ref 19–32)
Creat: 0.6 mg/dL (ref 0.50–1.10)
Sodium: 140 mEq/L (ref 135–145)
Total Bilirubin: 0.4 mg/dL (ref 0.3–1.2)
Total Protein: 7.5 g/dL (ref 6.0–8.3)

## 2010-12-04 LAB — GLUCOSE, CAPILLARY: Glucose-Capillary: 170 mg/dL — ABNORMAL HIGH (ref 70–99)

## 2010-12-04 NOTE — Progress Notes (Signed)
50 year old woman with diabetes, hyperlipidemia, obstructive sleep apnea, chronic venous insufficiency of lower extremities, iron and B12 deficiency because of menorrhagia comes to the clinic for followup.  She was recently diagnosed with diabetes and was started on metformin. The dose has been increased to 1000 mg twice a day. She has been tolerating this medication well.  She does not have any new complaints today.  Physical exam BP 129/71  Pulse 94  Temp(Src) 96.9 F (36.1 C) (Oral)  Ht _0  (1.6 m)  Wt 284 lb 3.2 oz (128.912 kg)  BMI 50.34 kg/m2  General Appearance:    obese, Alert, cooperative, no distress, appears stated age  Throat:   Lips, mucosa, and tongue normal; teeth and gums normal  Neck:   Supple, symmetrical, trachea midline, no adenopathy;       thyroid:  No enlargement/tenderness/nodules; no carotid   bruit or JVD  Back:     Symmetric, no curvature, ROM normal, no CVA tenderness  Lungs:     Clear to auscultation bilaterally, respirations unlabored  Chest wall:    No tenderness or deformity  Heart:    Regular rate and rhythm, S1 and S2 normal, no murmur, rub   or gallop  Abdomen:     Soft, non-tender, bowel sounds active all four quadrants,    no masses, no organomegaly  Extremities:   bilateral 2+ edema with chronic venous insufficiency skin changes   Pulses:   2+ and symmetric all extremities  Skin:   Skin color, texture, turgor normal, no rashes or lesions  Lymph nodes:   Cervical, supraclavicular, and axillary nodes normal  Neurologic:   CNII-XII intact. Normal strength, sensation and reflexes      throughout   Review of systems Constitutional: Denies fever, chills, diaphoresis, appetite change and fatigue.  HEENT: Denies photophobia, eye pain, redness, hearing loss, ear pain, congestion, sore throat, rhinorrhea, sneezing, mouth sores, trouble swallowing, neck pain, neck stiffness and tinnitus.   Respiratory: Denies SOB, DOE, cough, chest tightness,  and  wheezing.   Cardiovascular: Denies chest pain, palpitations and leg swelling.  Gastrointestinal: Denies nausea, vomiting, abdominal pain, diarrhea, constipation, blood in stool and abdominal distention.  Genitourinary: Denies dysuria, urgency, frequency, hematuria, flank pain and difficulty urinating.  Musculoskeletal: Denies myalgias, back pain, joint swelling, arthralgias and gait problem.  Skin: Denies pallor, rash and wound.  Neurological: Denies dizziness, seizures, syncope, weakness, light-headedness, numbness and headaches.  Hematological: Denies adenopathy. Easy bruising, personal or family bleeding history  Psychiatric/Behavioral: Denies suicidal ideation, mood changes, confusion, nervousness, sleep disturbance and agitation

## 2010-12-04 NOTE — Assessment & Plan Note (Signed)
Patient is on statin which was recently started. Check liver function test.

## 2010-12-04 NOTE — Assessment & Plan Note (Signed)
No evidence of skin infection. Continue Lasix. Advised her to keep them elevated at bedtime.

## 2010-12-04 NOTE — Patient Instructions (Addendum)
Follow up in February, preferably early in morning. Come fasting at that visit.  Also, schedule a follow up appointment with [name] at that time Call your eye doctor and let him know about your diabetes.

## 2010-12-04 NOTE — Assessment & Plan Note (Signed)
Compliant with CPAP

## 2010-12-04 NOTE — Assessment & Plan Note (Signed)
Continue metformin at this time. It's only to check HbA1c and she does not check her blood sugars at home. I called her back in 2 months to check for HbA1c. We'll also schedule an appointment with Barnabas Harries. Asked her to  Go . and see her ophthalmologist for diabetic eye exam. Check serum chemistry.

## 2010-12-15 ENCOUNTER — Encounter: Payer: Medicaid Other | Admitting: Internal Medicine

## 2010-12-17 ENCOUNTER — Ambulatory Visit (INDEPENDENT_AMBULATORY_CARE_PROVIDER_SITE_OTHER): Payer: Medicaid Other | Admitting: Dietician

## 2010-12-17 DIAGNOSIS — E119 Type 2 diabetes mellitus without complications: Secondary | ICD-10-CM

## 2010-12-17 NOTE — Patient Instructions (Addendum)
For your appointment on  February 27th, 2013 do not eat after midnight the day before.  Call and schedule an eye exam with Dr. Ronalee Belts said you will limit yourself to One dessert with added sugar a day. ( cake, cookies, candy)   use fruit for desserts when you want a second dessert.  Your blood sugar today is 79m/dl which is good.   Know what to do to lower blood sugar- exercise, decrease food portions          Nutrition: To improve blood glucose control I will follow meal plan of half a plate fo vegetables  and 1/4 starch and 1/4 protein with fruit and lowfat milk   Monitoring: I will always know my A1C - right now it was 13.5, due again in february  Reducing Risk: To decrease the risk for complications, I will check my feet once a day

## 2010-12-17 NOTE — Progress Notes (Signed)
Diabetes Self-Management Training (DSMT)  3nd visit  12/17/2010 Ms. Maidie Streight, identified by name and date of birth, is a 50 y.o. female with Type 2 Diabetes. Year of diabetes diagnosis: 10-2010 Other persons present: no  ASSESSMENT Patient concerns are Nutrition/meal planning, Monitoring and physical activity. Blood sugar in office before lunch today is 92 mg/dl.   Height _0  (1.6 m), weight 285 lb 3.2 oz (129.366 kg). Body mass index is 50.52 kg/(m^2). Lab Results  Component Value Date   LDLCALC 117* 10/14/2010   Lab Results  Component Value Date   HGBA1C 13.5* 10/14/2010    Labs reviewed.  Patients belief/attitude about diabetes: Diabetes can be controlled. Self foot exams daily: No Diabetes Complications: None Support systems: clergy Special needs: Large print, Simplified materials Prior DM Education:Yes   Medications See Medications list.  Is interested in learning more, taking as prescribed   Exercise Plan Doing ADLs and walking for at least 15-30 minutes a day at least 2 times a week.   Self-Monitoring Not testing at home and this is appropriate she desires to keep track of A1C and weight to self monitor   Meal Planning Some knowledge- see below   Assessment comments: patient still verbalizing confusion about counting carb grams. Weight increased by 1 pound. Eating too many chips and desserts per patient.  Better understanding today than at past appointment. Eats large amounts of high fat food choices.     INDIVIDUAL DIABETES EDUCATION PLAN:  Nutrition management Physical activity and exercise Monitoring Acute complication: _______________________________________________________________________  Intervention TOPICS COVERED TODAY:  Nutrition: reviewed carbohydrates portions and reinforced previous concept of low fat food choices. Discouraged chips, cookies, candy and desserts which patient identified as a problem. Exercise- encouraged walking more.     PATIENTS GOALS/PLAN (copy and paste in patient instructions so patient receives a copy): 1.  Learning Objective:       Know what to do to lower blood sugar- patient    2.  Behavioral Objective:         Nutrition: To improve blood glucose control I will follow meal plan of half a plate fo vegetables  and 1/4 starch and 1/4 protein with fruit and lowfat milk Sometimes 25%  Physical Activity: For improved blood glucose control and decrease insulin resistance, I will  exercise 15 minutes 2 days a week   100%  Monitoring: To identify blood glucose trends, I will always know A1C Never 50%  Reducing Risk: To decrease the risk for complications, I will check my feet once a day                Most of the time 75%  Personalized Follow-Up Plan for Ongoing Self Management Support:  church, family and CDE visits ______________________________________________________________________   Outcomes Expected outcomes: Demonstrated interest in learning.Expect positive changes in lifestyle. Self-care Barriers: Hard of hearing, Low literacy, Lack of material resources, Coping skills Education material provided: Picture handout of carb portions and amounts patient should be consuming at meals and snacks Patient to contact team via Phone if problems or questions.  Time in: 1100     Time out: 1200  Future DSMT - 4 wks   Nekeshia Lenhardt, [name]

## 2011-01-12 ENCOUNTER — Other Ambulatory Visit: Payer: Self-pay | Admitting: *Deleted

## 2011-01-12 NOTE — Telephone Encounter (Signed)
Last refill was for #60 tabs; she takes 2 tabs 2 times daily (#120).

## 2011-01-13 MED ORDER — METFORMIN HCL 500 MG PO TABS: 1000.0000 mg | ORAL_TABLET | Freq: Two times a day (BID) | ORAL | Status: DC

## 2011-01-19 ENCOUNTER — Ambulatory Visit (INDEPENDENT_AMBULATORY_CARE_PROVIDER_SITE_OTHER): Payer: Medicaid Other | Admitting: Dietician

## 2011-01-19 ENCOUNTER — Other Ambulatory Visit: Payer: Self-pay | Admitting: Dietician

## 2011-01-19 DIAGNOSIS — E785 Hyperlipidemia, unspecified: Secondary | ICD-10-CM

## 2011-01-19 DIAGNOSIS — E119 Type 2 diabetes mellitus without complications: Secondary | ICD-10-CM

## 2011-01-19 LAB — GLUCOSE, CAPILLARY: Glucose-Capillary: 134 mg/dL — ABNORMAL HIGH (ref 70–99)

## 2011-01-19 LAB — POCT GLYCOSYLATED HEMOGLOBIN (HGB A1C): Hemoglobin A1C: 7.7

## 2011-01-19 MED ORDER — PRAVASTATIN SODIUM 20 MG PO TABS: 20.0000 mg | ORAL_TABLET | Freq: Every day | ORAL | Status: DC

## 2011-01-19 NOTE — Telephone Encounter (Signed)
Requests refill of pravastatin

## 2011-01-19 NOTE — Progress Notes (Signed)
Diabetes Self-Management Training (DSMT)  4th visit  01/19/2011 [name], identified by name and date of birth, is a 51 y.o. female with Type 2 Diabetes. Year of diabetes diagnosis: 10-2010 Other persons present: no  ASSESSMENT Patient concerns are Nutrition/meal planning, Monitoring and physical activity. Blood sugar before lunch today is 134 mg/dl ~ after breakfast of 1 cup/fist raisin bran cereal and !% milk.   Height _0  (1.6 m), weight 282 lb 3.2 oz (128.005 kg). Body mass index is 49.99 kg/(m^2). Lab Results  Component Value Date   LDLCALC 117* 10/14/2010   Lab Results  Component Value Date   HGBA1C 7.7* 10/14/2010    Labs reviewed.  Patients belief/attitude about diabetes: Diabetes can be controlled. Self foot exams daily: Sometimes- going to start doing better today Diabetes Complications: None Support systems: son and clergy Special needs: Large print, Simplified materials Prior DM Education:Yes   Medications See Medications list.  Is interested in learning more, taking as prescribed   Exercise Plan Doing ADLs and walking for at least 15-30 minutes a day at least 2 times a week. Thinks she still needs to work on this more and would like to join a group. Gave her [facility] walking group information today that is free of charge.   Self-Monitoring Not testing at home and this is appropriate she desires to keep track of A1C and weight to self monitor. Knows her A1C today.   Meal Planning Some knowledge- see below   Assessment comments: Verbalizing ability to count carb grams. Weight decreased by 3 pounds. Eating less chips by using cereal mixes and lower fat and portion controlled desserts per patient.  Better understanding today than at past appointment. Eats large amounts of high fat food choices.     INDIVIDUAL DIABETES EDUCATION PLAN:  Nutrition management Physical activity and exercise Monitoring Acute  complication: _______________________________________________________________________  Intervention TOPICS COVERED TODAY:  Nutrition: reviewed carbohydrates portions and reinforced previous concept of low fat food choices. Discouraged chips, cookies, candy and desserts which patient identified as a problem. Exercise- encouraged walking more.    PATIENTS GOALS/PLAN (copy and paste in patient instructions so patient receives a copy): 1.  Learning Objective:       Know what to do to lower blood sugar- patient    2.  Behavioral Objective:         Nutrition: To improve blood glucose control I will follow meal plan of half a plate fo                           vegetables and 1/4 starch and 1/4 protein with fruit and lowfat milk Sometimes 50 %  Physical Activity: For improved blood glucose control and decrease insulin resistance, I will  exercise 15 minutes 2 days a week   Half of the time 50%  Monitoring: To identify blood glucose trends, I will always know A1C Always 100%  Reducing Risk: To decrease the risk for complications, I will check my feet once a day                Most of the time 75%  Personalized Follow-Up Plan for Ongoing Self Management Support:  church, family and CDE visits ______________________________________________________________________   Outcomes Expected outcomes: Demonstrated interest in learning.Expect positive changes in lifestyle. Self-care Barriers: Hard of hearing, Low literacy, Lack of material resources, Coping skills Education material provided: Picture handout of carb portions and amounts patient should be consuming  at meals and snacks Patient to contact team via Phone if problems or questions. Time in: 1100     Time out: 1145 Future DSMT - 4-6 wks   [name], [name]

## 2011-01-19 NOTE — Telephone Encounter (Signed)
done

## 2011-01-19 NOTE — Patient Instructions (Signed)
Make follow up in 6 weeks.  Please check the fat and fiber on the [employer].  Check feet daily  Call eye doctor.  Maybe look into [employer] center... For group exercise.  Your new A1C number is 7.7% That is much better- this means your average blood sugar is about 145m/dl  Your new goal is an A1C less than 7 so your blood sugars should always be 140.  Great  Job with weight loss and lowering your a1C!! Keep up the great work!!!

## 2011-02-15 ENCOUNTER — Other Ambulatory Visit: Payer: Self-pay | Admitting: Internal Medicine

## 2011-02-15 DIAGNOSIS — Z1231 Encounter for screening mammogram for malignant neoplasm of breast: Secondary | ICD-10-CM

## 2011-03-03 ENCOUNTER — Encounter: Payer: Medicaid Other | Admitting: Internal Medicine

## 2011-03-03 ENCOUNTER — Ambulatory Visit (INDEPENDENT_AMBULATORY_CARE_PROVIDER_SITE_OTHER): Payer: Medicaid Other | Admitting: Internal Medicine

## 2011-03-03 ENCOUNTER — Encounter: Payer: Self-pay | Admitting: Internal Medicine

## 2011-03-03 DIAGNOSIS — K219 Gastro-esophageal reflux disease without esophagitis: Secondary | ICD-10-CM

## 2011-03-03 DIAGNOSIS — G4733 Obstructive sleep apnea (adult) (pediatric): Secondary | ICD-10-CM

## 2011-03-03 DIAGNOSIS — L97909 Non-pressure chronic ulcer of unspecified part of unspecified lower leg with unspecified severity: Secondary | ICD-10-CM

## 2011-03-03 DIAGNOSIS — Z23 Encounter for immunization: Secondary | ICD-10-CM

## 2011-03-03 DIAGNOSIS — E785 Hyperlipidemia, unspecified: Secondary | ICD-10-CM

## 2011-03-03 DIAGNOSIS — I83009 Varicose veins of unspecified lower extremity with ulcer of unspecified site: Secondary | ICD-10-CM

## 2011-03-03 DIAGNOSIS — E119 Type 2 diabetes mellitus without complications: Secondary | ICD-10-CM

## 2011-03-03 DIAGNOSIS — D518 Other vitamin B12 deficiency anemias: Secondary | ICD-10-CM

## 2011-03-03 LAB — GLUCOSE, CAPILLARY: Glucose-Capillary: 85 mg/dL (ref 70–99)

## 2011-03-03 MED ORDER — PANTOPRAZOLE SODIUM 20 MG PO TBEC: 20.0000 mg | DELAYED_RELEASE_TABLET | Freq: Two times a day (BID) | ORAL | Status: DC

## 2011-03-03 MED ORDER — FUROSEMIDE 20 MG PO TABS: 20.0000 mg | ORAL_TABLET | Freq: Every day | ORAL | Status: DC

## 2011-03-03 NOTE — Assessment & Plan Note (Signed)
Check lipid profile and liver function test Continue Pravachol

## 2011-03-03 NOTE — Assessment & Plan Note (Signed)
Compliant with her CPAP Steadily losing weight. Congratulated  her for her progress

## 2011-03-03 NOTE — Assessment & Plan Note (Signed)
Check ferritin and vitamin B12 Has been on iron tablets and B12 for a long time  hemoglobin was 13.95 months ago stop this supplementations if stores have been repleted

## 2011-03-03 NOTE — Assessment & Plan Note (Signed)
Continue low-dose Lasix Encouraged keeping the feet elevated when she is lying down

## 2011-03-03 NOTE — Progress Notes (Signed)
[name], female   DOB: 08/04/1960, 51 y.o.   MRN: [id number]  51 year old woman with past medical history of diabetes, hyperlipidemia, obstructive sleep apnea, chronic venous insufficiency, iron and B12 deficiency comes to the clinic for checkup No complaints today. Complaint with her medication and CPAP  Physical exam   General Appearance:     Filed Vitals:   03/03/11 0926  BP: 125/68  Pulse: 85  Temp: 97.2 F (36.2 C)  TempSrc: Oral  Height: _0  (1.626 m)  Weight: 277 lb 11.2 oz (125.964 kg)  SpO2: 95%     Alert, cooperative, no distress, appears stated age  Head:    Normocephalic, without obvious abnormality, atraumatic  Eyes:    PERRL, conjunctiva/corneas clear, EOM's intact, fundi    benign, both eyes       Neck:   Supple, symmetrical, trachea midline, no adenopathy;       thyroid:  No enlargement/tenderness/nodules; no carotid   bruit or JVD  Lungs:     Clear to auscultation bilaterally, respirations unlabored  Chest wall:    No tenderness or deformity  Heart:    Regular rate and rhythm, S1 and S2 normal, no murmur, rub   or gallop  Abdomen:     Soft, non-tender, bowel sounds active all four quadrants,    no masses, no organomegaly  Extremities:   bilateral 3+ pitting edema with skin changes are chronic edema no skin breakdown or cellulitis ,   Pulses:   2+ and symmetric all extremities  Skin:   Skin color, texture, turgor normal, no rashes or lesions  Neurologic:  nonfocal grossly   ROS  Constitutional: Denies fever, chills, diaphoresis, appetite change and fatigue.  Respiratory: Denies SOB, DOE, cough, chest tightness,  and wheezing.   Cardiovascular: Denies chest pain, palpitations and leg swelling.  Gastrointestinal: Denies nausea, vomiting, abdominal pain, diarrhea, constipation, blood in stool and abdominal distention.  Skin: Denies pallor, rash and wound.  Neurological: Denies dizziness, light-headedness, numbness and headaches.

## 2011-03-03 NOTE — Assessment & Plan Note (Signed)
Patient is taking her metformin thousand milligram twice a day every day No symptoms suggestive of hypoglycemia. Does not check sugars at home Seems to be well-controlled Follows up with [name] P. Foot exam done today Up-to-date on ophthalmology exam Check urine microalbumin/creatinine, start lisinopril if more than 30

## 2011-03-04 LAB — COMPREHENSIVE METABOLIC PANEL
ALT: 23 U/L (ref 0–35)
AST: 27 U/L (ref 0–37)
BUN: 11 mg/dL (ref 6–23)
Calcium: 9.2 mg/dL (ref 8.4–10.5)
Creat: 0.59 mg/dL (ref 0.50–1.10)
Total Bilirubin: 0.5 mg/dL (ref 0.3–1.2)

## 2011-03-04 LAB — LIPID PANEL
HDL: 40 mg/dL (ref >39)
Total CHOL/HDL Ratio: 4.1 Ratio
VLDL: 23 mg/dL (ref 0–40)

## 2011-03-04 LAB — MICROALBUMIN / CREATININE URINE RATIO
Creatinine, Urine: 106.5 mg/dL
Microalb, Ur: 2.44 mg/dL — ABNORMAL HIGH (ref 0.00–1.89)

## 2011-03-04 LAB — VITAMIN B12: Vitamin B-12: 598 pg/mL (ref 211–911)

## 2011-03-04 LAB — FERRITIN: Ferritin: 78 ng/mL (ref 10–291)

## 2011-03-05 NOTE — Progress Notes (Signed)
Quick Note:  Continue iron and B12 tablets at this time. No need for ACE inhibitor because patient is not hypertensive and not microalbuminuric ______

## 2011-03-08 ENCOUNTER — Ambulatory Visit (INDEPENDENT_AMBULATORY_CARE_PROVIDER_SITE_OTHER): Payer: Medicaid Other | Admitting: Dietician

## 2011-03-08 ENCOUNTER — Encounter: Payer: Self-pay | Admitting: Dietician

## 2011-03-08 DIAGNOSIS — E119 Type 2 diabetes mellitus without complications: Secondary | ICD-10-CM

## 2011-03-08 NOTE — Progress Notes (Signed)
Diabetes Self-Management Training (DSMT)  Follow-Up 4 Visit- 5th visit  03/08/2011 [name], identified by name and date of birth, is a 51 y.o. female with Type 2 Diabetes. Year of diabetes diagnosis: 2012 Other persons present: no  ASSESSMENT Patient concerns are Nutrition/meal planning, Problem solving, Weight control and Support.  Height _0  (1.626 m), last menstrual period 12/31/2010. There is no weight on file to calculate BMI. Lab Results  Component Value Date   LDLCALC 99 03/03/2011   Lab Results  Component Value Date   HGBA1C 7.7 01/19/2011   Medication Nutrition Monitor: none  Labs reviewed.  DIABETES BUNDLE: A1C in past 6 months? Yes.  Less than 7%? Yes LDL in past year? Yes.  Less than 100 mg/dL? Yes Microalbumin ratio in past year? Yes. Blood pressure less than 130/80? Yes. Foot exam in last year? Yes. Eye exam in past year? No.  Reminded patient to schedule eye exam. Tobacco use? No. Pneumovax? Yes Flu vaccine? Yes Asprin? Yes  Family history of diabetes: No Patients belief/attitude about diabetes: Diabetes can be controlled. Self foot exams daily: Yes Diabetes Complications: None Support systems: family- son lives with her Special needs: Large print, Simplified materials, Verbal instruction Prior DM Education: Yes   Medications See Medications list.  Has adequate knowledge and Is interested in learning more   Exercise Plan Doing ADLs for 15 minutesa day.   Self-Monitoring Frequency of testing: not testing Breakfast: after meal: 112 today in office  Hyperglycemia: Yes Weekly Hypoglycemia: No   Meal Planning Some knowledge- still eating many processed foods, typically high in salt and fat, low in fiber.   Assessment comments:Tired today- not sleeping well. Having trouble staying asleep, afraid to lay down all the way or sleep on her side. Sleeps with back rest and pillow. Discussed diabetes support group, planned for diabetes self  managmentn support nad exercise groups    INDIVIDUAL DIABETES EDUCATION PLAN:  Nutrition management Physical activity and exercise Acute complication: Chronic complications Goal setting _______________________________________________________________________  Intervention TOPICS COVERED TODAY:  Nutrition management  Role of diet in the treatment of diabetes and the relationship between the three main macronutritents and blood glucose control. Meal options for control of blood glucose level and chronic complications. Physical activity and exercise  Helped patient identify appropriate exercises in relation to his/her diabetes, diabetes complications and other health issue. Acute complication  Discussed and identified patients' treatment of hyperglycemia. Chronic complications  Assessed and discussed foot care and prevention of foot problems Retinopathy and reason for yearly dilated eye exams Goal setting  Lifestyle issues that need to be addressed for better diabetes care  PATIENTS GOALS/PLAN (copy and paste in patient instructions so patient receives a copy): 1.  Learning Objective:       Demonstrate importance of physical activity and healthy eating 2.  Behavioral Objective:         Nutrition: To improve blood glucose control I will follow meal plan of plate method- helf a plate veggies , lower fat snacks Sometimes 25% Physical Activity: For improved blood glucose control and decrease insulin resistance, I will exercise 5 days a week  Sometimes 25% Problem Solving: To improve my blood glucose control, I will try attending a support group  Never 0% Reducing Risk: To decrease the risk for complications, I will schedule eye exam  Sometimes 25%  Personalized Follow-Up Plan for Ongoing Self Management Support:  [place], family and CDE visits ______________________________________________________________________   Outcomes Expected outcomes: Demonstrated interest in learning.Expect  positive changes in lifestyle. Self-care Barriers: Low literacy, Lack of transportation, Lack of material resources, Coping skills Education material provided: yes Patient to contact team via Phone if problems or questions. Time in: 1030     Time out: 1130  Future DSMT - 4-6 wks   Thoma Paulsen, [name]

## 2011-03-08 NOTE — Patient Instructions (Signed)
Your breakfast of eggs and toast sounds great!  Fruit for snack sounds great!  Try fruit or yogurt instead of cup cake at bedtime snack Try pretzels,nuts carrots, celery, fruit or any veggie instead of chips  Soda: best choices- diet 7-up or diet ginger ale, sprite, diet fresca, diet sierra mist, diet orange ( minute maid, sunkist) with no caffeine  Sausage dip makeover: light cream cheese, salsa, light sausage, dip baked tostidos.  You blood sugar today was 112.  Last two things to work on: your eye exam and exercise.  Please consider trying the diabetes support group meeting once time.

## 2011-03-23 ENCOUNTER — Ambulatory Visit (HOSPITAL_COMMUNITY)
Admission: RE | Admit: 2011-03-23 | Discharge: 2011-03-23 | Disposition: A | Discharge status: Home / Self Care | Payer: Medicaid Other | Source: Ambulatory Visit | Attending: Internal Medicine | Admitting: Internal Medicine

## 2011-03-23 DIAGNOSIS — Z1231 Encounter for screening mammogram for malignant neoplasm of breast: Secondary | ICD-10-CM

## 2011-03-24 ENCOUNTER — Other Ambulatory Visit: Payer: Self-pay | Admitting: Internal Medicine

## 2011-03-24 DIAGNOSIS — R928 Other abnormal and inconclusive findings on diagnostic imaging of breast: Secondary | ICD-10-CM

## 2011-03-30 ENCOUNTER — Ambulatory Visit
Admission: RE | Admit: 2011-03-30 | Discharge: 2011-03-30 | Disposition: A | Discharge status: Home / Self Care | Payer: Medicaid Other | Source: Ambulatory Visit | Attending: Internal Medicine | Admitting: Internal Medicine

## 2011-03-30 ENCOUNTER — Other Ambulatory Visit: Payer: Self-pay | Admitting: *Deleted

## 2011-03-30 ENCOUNTER — Other Ambulatory Visit: Payer: Self-pay | Admitting: Internal Medicine

## 2011-03-30 DIAGNOSIS — R928 Other abnormal and inconclusive findings on diagnostic imaging of breast: Secondary | ICD-10-CM

## 2011-04-01 MED ORDER — FLUTICASONE PROPIONATE 50 MCG/ACT NA SUSP: 2.0000 | Freq: Every day | NASAL | Status: DC

## 2011-04-14 ENCOUNTER — Ambulatory Visit
Admission: RE | Admit: 2011-04-14 | Discharge: 2011-04-14 | Disposition: A | Discharge status: Home / Self Care | Payer: Medicaid Other | Source: Ambulatory Visit | Attending: Internal Medicine | Admitting: Internal Medicine

## 2011-04-14 ENCOUNTER — Other Ambulatory Visit: Payer: Self-pay | Admitting: Internal Medicine

## 2011-04-14 DIAGNOSIS — R928 Other abnormal and inconclusive findings on diagnostic imaging of breast: Secondary | ICD-10-CM

## 2011-04-19 ENCOUNTER — Ambulatory Visit (INDEPENDENT_AMBULATORY_CARE_PROVIDER_SITE_OTHER): Payer: Medicaid Other | Admitting: Dietician

## 2011-04-19 ENCOUNTER — Encounter: Payer: Self-pay | Admitting: Dietician

## 2011-04-19 DIAGNOSIS — Z79899 Other long term (current) drug therapy: Secondary | ICD-10-CM

## 2011-04-19 DIAGNOSIS — E119 Type 2 diabetes mellitus without complications: Secondary | ICD-10-CM

## 2011-04-19 NOTE — Progress Notes (Signed)
Diabetes Self-Management Training (DSMT)  Follow-Up 5 Visit  04/19/2011 Ms. Catherine Cubero, identified by name and date of birth, is a 51 y.o. female with Type 2 Diabetes. Year of diabetes diagnosis: 2012 Other persons present: no  ASSESSMENT Patient concerns are Nutrition/meal planning, Problem solving, Weight control and Support.  Last menstrual period 02/19/2011. There is no height or weight on file to calculate BMI. Lab Results  Component Value Date   LDLCALC 99 03/03/2011   Lab Results  Component Value Date   HGBA1C 6.4% 04/19/2011   Medication Nutrition Monitor: none Labs reviewed.  DIABETES BUNDLE: A1C in past 6 months? yes  Less than 7%? yes Eye exam in past year? Saw Dr. Herbert Deaner last month- diabetes was not affecting her eyes, has pre-cataracts  Patients belief/attitude about diabetes: Diabetes can be controlled.    Support systems: family- Legrand [place]- her son lives with her Special needs: Large print, Simplified materials, Verbal instruction    Medications See Medications list.  Has adequate knowledge and Is interested in learning more   Exercise Plan Doing ADLs for 15 minutesa day.   Self-Monitoring Frequency of testing: not testing Breakfast: after meal: 112 today in office  Hyperglycemia: Yes Weekly Hypoglycemia: No   Meal Planning Some knowledge- still eating many processed foods, typically high in salt and fat, low in fiber.   Assessment comments: if she wears a bra at night she doesn't wake up as much. Discussed diabetes support group, assisted patient in trying to  find out about exercise classes she can attend.   INDIVIDUAL DIABETES EDUCATION PLAN:  Nutrition management Physical activity and exercise Goal setting _______________________________________________________________________  Intervention TOPICS COVERED TODAY:  Nutrition management: balanced meals and healthy snacks reviewed.  Physical activity and exercise  Helped patient identify  appropriate exercises in relation to his/her diabetes, diabetes complications and other health issue. Goal setting  Lifestyle issues that need to be addressed for better diabetes care  PATIENTS GOALS/PLAN (copy and paste in patient instructions so patient receives a copy): 1.  Learning Objective:       Demonstrate importance of physical activity and healthy eating 2.  Behavioral Objective:         Nutrition: To improve blood glucose control I will follow meal plan of plate method- helf a plate veggies , lower fat snacks Sometimes 25% Physical Activity: For improved blood glucose control and decrease insulin resistance, I will exercise 5 days a week  Sometimes 25% Problem Solving: To improve my blood glucose control, I will try attending a support group  Never 0% Reducing Risk: To decrease the risk for complications, I will schedule eye exam  Always 100%  Personalized Follow-Up Plan for Ongoing Self Management Support:  [place], family and CDE visits ______________________________________________________________________   Outcomes Expected outcomes: Demonstrated interest in learning.Expect positive changes in lifestyle. Self-care Barriers: Low literacy, Lack of transportation, Lack of material resources, Coping skills Education material provided: yes Patient to contact team via Phone if problems or questions. Time in: 1100     Time out: 1150  Future DSMT - 4-6 wks   Sharmayne Jablon, [name]

## 2011-04-19 NOTE — Patient Instructions (Signed)
Congratulations on your A1C being at [place] and getting an EYE exam!!!!   Nutrition: To improve blood glucose control I will follow meal plan of plate method- helf a plate veggies , lower fat snacks.   Physical Activity: For improved blood glucose control and decrease my body's insulin resistance, I will exercise 15 minutes 5 days a week.  Problem Solving: To improve my blood glucose control, I will try attending a support group meeting.     Let's follow up in 6-8 weeks to see how you are doing with your goals above.

## 2011-06-07 ENCOUNTER — Encounter: Payer: Self-pay | Admitting: Dietician

## 2011-06-07 ENCOUNTER — Ambulatory Visit (INDEPENDENT_AMBULATORY_CARE_PROVIDER_SITE_OTHER): Payer: Medicaid Other | Admitting: Dietician

## 2011-06-07 DIAGNOSIS — E119 Type 2 diabetes mellitus without complications: Secondary | ICD-10-CM

## 2011-06-07 NOTE — Progress Notes (Signed)
Diabetes Self-Management Training (DSMT)  Follow-Up 6 Visit  06/07/2011 [name], identified by name and date of birth, is a 51 y.o. female with Type 2 Diabetes. Year of diabetes diagnosis: 2012 Other persons present: no  ASSESSMENT Patient concerns are Nutrition/meal planning, Problem solving, Weight control and Support.  Height _0  (1.626 m), weight 269 lb 3.2 oz (122.108 kg). Body mass index is 46.21 kg/(m^2). Lab Results  Component Value Date   LDLCALC 99 03/03/2011   Lab Results  Component Value Date   HGBA1C 6.4% 04/19/2011   Medication Nutrition Monitor: none Labs reviewed. Patients belief/attitude about diabetes: Diabetes can be controlled.  Support systems: family- Legrand Como- her son lives with her Special needs: Large print, Simplified materials, Verbal instruction    Medications See Medications list.  Has adequate knowledge and Is interested in learning more   Exercise Plan Doing ADLs for 15 minutes a day.   Self-Monitoring Frequency of testing: not testing   Meal Planning Some knowledge- Eating lower fat and healthier choices, Limited nad monthly budget limits choices.   Assessment comments: if she wears a bra at night she doesn't wake up as much. Discussed diabetes support group, assisted patient in trying to  find out about exercise classes she can attend.   INDIVIDUAL DIABETES EDUCATION PLAN:  Nutrition management Physical activity and exercise Goal setting _______________________________________________________________________  Intervention TOPICS COVERED TODAY:  Nutrition management: balanced meals and healthy snacks reviewed.  Physical activity and exercise  Helped patient identify appropriate exercises in relation to his/her diabetes, diabetes complications and other health issue. Goal setting  Lifestyle issues that need to be addressed for better diabetes care  PATIENTS GOALS/PLAN (copy and paste in patient instructions so patient  receives a copy): 1.  Learning Objective:       Demonstrate importance of physical activity and healthy eating 2.  Behavioral Objective:      Nutrition: To improve blood glucose control I will follow meal plan of plate method- half a plate veggies/fruits, lower fat snacks Sometimes 50% Physical Activity: For improved blood glucose control and decrease insulin resistance, I will exercise 5 days a week  Sometimes 20% Problem Solving: To improve my blood glucose control, I will try attending a support group  Never 0%  Personalized Follow-Up Plan for Ongoing Self Management Support:  [place], family and CDE visits ______________________________________________________________________   Outcomes Expected outcomes: Demonstrated interest in learning.Expect positive changes in lifestyle. Self-care Barriers: Low literacy, Lack of transportation, Lack of material resources, Coping skills Education material provided: yes Patient to contact team via Phone if problems or questions. Time in: 1130     Time out: 1200  Future DSMT - 8 wks   Edelmiro Innocent, [name]

## 2011-06-07 NOTE — Patient Instructions (Addendum)
Please make and appointment with your doctor ASAP.  Your fruit goal is to eat 2-4 servings each day:  What to buy once a month to have enough for the whole month: Fruits: 1- Bananas-buy 8:  4 are for this week and 4 for next week: peel and break in half then freeze 2- Frozen orange juice-2 containers of 4 oz servings x 24 servings 3- Apples: bag of smaller ones -: 8 servings 4- Dried prunes- eat 2 each day- should last most of month.  6- Raisins- a serving is 2 Tbsps- mix with 2 Tbsp unsalted peanuts for a snack. Large container should last most of month. 7- Fruit in season- 1 cup or about 8 chunks of watermelon or cantelope  Veggies: Frozen greens, broccoli, peas, mixed vegetables or stir fry- can eat thawed peas as a snack or lunch vegetable.  Celery with small amount of peanut butter for snacks or lunch. Carrots- buy bag 2 pound usually best buy  Buy ground [place] or chicken instead of hamburger- breast is lower in fat. Do not add fat if possible- can use a little bit of water or spray oil like pam or lower temperature to keep it from sticking.  You said you want to attend Diabetes SUPPORT GROUP in July- July 8th is second Tuesday.  Please complete activity log sheets and bring back to next visit in 8 weeks.

## 2011-06-10 ENCOUNTER — Encounter: Payer: Self-pay | Admitting: Internal Medicine

## 2011-06-10 ENCOUNTER — Ambulatory Visit (INDEPENDENT_AMBULATORY_CARE_PROVIDER_SITE_OTHER): Payer: Medicaid Other | Admitting: Internal Medicine

## 2011-06-10 VITALS — BP 116/72 | HR 86 | Temp 96.7°F | Ht 63.0 in | Wt 268.8 lb

## 2011-06-10 DIAGNOSIS — E139 Other specified diabetes mellitus without complications: Secondary | ICD-10-CM

## 2011-06-10 DIAGNOSIS — I83009 Varicose veins of unspecified lower extremity with ulcer of unspecified site: Secondary | ICD-10-CM

## 2011-06-10 DIAGNOSIS — L97909 Non-pressure chronic ulcer of unspecified part of unspecified lower leg with unspecified severity: Secondary | ICD-10-CM

## 2011-06-10 DIAGNOSIS — E119 Type 2 diabetes mellitus without complications: Secondary | ICD-10-CM

## 2011-06-10 LAB — GLUCOSE, CAPILLARY: Glucose-Capillary: 106 mg/dL — ABNORMAL HIGH (ref 70–99)

## 2011-06-10 MED ORDER — DOXYCYCLINE HYCLATE 100 MG PO TABS: 100.0000 mg | ORAL_TABLET | Freq: Two times a day (BID) | ORAL | Status: AC

## 2011-06-10 NOTE — Progress Notes (Signed)
Addended by: Jenelle Mages on: 06/10/2011 04:17 PM   Modules accepted: Orders

## 2011-06-10 NOTE — Progress Notes (Signed)
Subjective:     [name], female   DOB: 12-01-1960, 51 y.o.   MRN: [id number]  HPI Patient is a very pleasant 51 year old woman with diabetes, hyperlipidemia, chronic venous stasis, and sleep apnea who presents with a left lower extremity ulcer.  Patient describes 2 weeks of progressively enlarging anterior left shin ulcers. Minimal surrounding erythema, no fevers and chills, had tried some basic wound dressings but had continued exudate. She has had difficulty in the past with stockings because of a tourniquet effect at the knee.    Review of Systems No f/c, no malaise, no abdominal pain    Objective:   Physical Exam Gen: NAD, pleasant Legs: b/l legs showed tight skin with hyperpigmentation and thickening c/w chronic venous stasis.  L anterior leg at the mid calf shows 2 dime sizes ulcers covered in white exudate.  Probed and do not go deeper.  Penetrates into dermis.  No substantial surrounding erythema.  Mild TTP.    Assessment:         Plan:

## 2011-06-10 NOTE — Assessment & Plan Note (Addendum)
Patient presents with 2 dime-sized active ulcers, progressively enlarging over the past 2 weeks. Minimal surrounding erythema, do not suspect surrounding cellulitis. Patient has previously been a patient at the wound care clinic, but has not come to see him yet. Afebrile in the office, no signs or symptoms of systemic infection. Patient needs to get in to see the wound care clinic, and in the meantime we'll prescribe a course of antibiotics. I suspect this is in relation to her chronic venous stasis and not from arterial insufficiency, but an arterial study had been ordered on her. She has had problems with TED hose because of a tourniquet effect at the knee. - Doxycycline 100 mg twice a day x10 days - Wound care appointment set for 06/18/11 - Arterial Dopplers have been ordered last year but never completed, so we scheduled her for those today, can be reviewed by PCP

## 2011-06-17 ENCOUNTER — Ambulatory Visit (HOSPITAL_COMMUNITY)
Admission: RE | Admit: 2011-06-17 | Discharge: 2011-06-17 | Disposition: A | Discharge status: Home / Self Care | Payer: Medicaid Other | Source: Ambulatory Visit | Attending: Internal Medicine | Admitting: Internal Medicine

## 2011-06-17 DIAGNOSIS — I83009 Varicose veins of unspecified lower extremity with ulcer of unspecified site: Secondary | ICD-10-CM | POA: Insufficient documentation

## 2011-06-17 DIAGNOSIS — I872 Venous insufficiency (chronic) (peripheral): Secondary | ICD-10-CM | POA: Insufficient documentation

## 2011-06-17 DIAGNOSIS — L97909 Non-pressure chronic ulcer of unspecified part of unspecified lower leg with unspecified severity: Secondary | ICD-10-CM

## 2011-06-17 DIAGNOSIS — R0989 Other specified symptoms and signs involving the circulatory and respiratory systems: Secondary | ICD-10-CM

## 2011-06-17 NOTE — Progress Notes (Signed)
*  PRELIMINARY RESULTS* Vascular Ultrasound Lower Extremity Arterial Doppler has been completed.    VASCULAR LAB PRELIMINARY  ARTERIAL  ABI completed: Bilateral normal ABI study    RIGHT    LEFT    PRESSURE WAVEFORM  PRESSURE WAVEFORM  BRACHIAL 108 tri BRACHIAL 113 tri  DP   DP    AT 123 tri AT 117 tri  PT 120 tri PT 139 tri  PER   PER    GREAT TOE  NA GREAT TOE  NA    RIGHT LEFT  ABI 10.9 1.23     Landry Mellow, RDMS 06/17/2011, 10:51 AM

## 2011-06-18 ENCOUNTER — Encounter (HOSPITAL_BASED_OUTPATIENT_CLINIC_OR_DEPARTMENT_OTHER): Payer: Medicaid Other | Attending: General Surgery

## 2011-06-18 DIAGNOSIS — I872 Venous insufficiency (chronic) (peripheral): Secondary | ICD-10-CM | POA: Insufficient documentation

## 2011-06-18 DIAGNOSIS — E785 Hyperlipidemia, unspecified: Secondary | ICD-10-CM | POA: Insufficient documentation

## 2011-06-18 DIAGNOSIS — Z79899 Other long term (current) drug therapy: Secondary | ICD-10-CM | POA: Insufficient documentation

## 2011-06-18 DIAGNOSIS — K219 Gastro-esophageal reflux disease without esophagitis: Secondary | ICD-10-CM | POA: Insufficient documentation

## 2011-06-18 DIAGNOSIS — G4730 Sleep apnea, unspecified: Secondary | ICD-10-CM | POA: Insufficient documentation

## 2011-06-18 DIAGNOSIS — L97809 Non-pressure chronic ulcer of other part of unspecified lower leg with unspecified severity: Secondary | ICD-10-CM | POA: Insufficient documentation

## 2011-06-18 DIAGNOSIS — E119 Type 2 diabetes mellitus without complications: Secondary | ICD-10-CM | POA: Insufficient documentation

## 2011-06-18 NOTE — H&P (Signed)
NAMEALYXIS, GRIPPI NO.:  [id number]  MEDICAL RECORD NO.:  [id number]  LOCATION:  FOOT                         FACILITY:  [facility]  PHYSICIAN:  Elesa Hacker, M.D.        DATE OF BIRTH:  1960/01/28  DATE OF ADMISSION:  06/18/2011 DATE OF DISCHARGE:                             HISTORY & PHYSICAL   CHIEF COMPLAINT:  Ulceration of the left lower extremity.  HISTORY OF PRESENT ILLNESS:  This 51 year old female with a long history of chronic venous insufficiency with stasis changes.  Over the course of last 2 weeks, she has developed 2 small superficial ulcerations on the anterior surface of her left leg.  She was seen by her medical doctor, started her on antibiotics and treated with bacitracin ointment.  She comes in for further treatment.  She has had multiple episodes of varicose venous ulcers and has responded to [employer] boots in the past.  PAST MEDICAL HISTORY:  Significant for thrombocytosis, chronic neutrophilia, GERD, depression, shortness of breath and asthma, history of cholecystectomy, hyperlipidemia, sleep apnea, and diabetes mellitus.  MEDICATIONS:  Include omeprazole, albuterol, Lasix, metformin, doxycycline, and Valtrex.  ALLERGIES:  ASA and SULFA.  SOCIAL HISTORY:  Cigarettes and alcohol, none.  REVIEW OF SYSTEMS:  As above.  PHYSICAL EXAMINATION:  VITAL SIGNS:  Temperature 97.9, pulse 84, respirations 20, blood pressure 105/68. GENERAL APPEARANCE:  Well developed, well nourished, somewhat obese. CHEST:  Clear. HEART:  Regular rhythm. EXTREMITIES:  There are 2 ulcerations of the left lower extremity anteriorly 1.0 x 1.4 and 0.8 x 0.5.  IMPRESSION:  Stasis ulcers with chronic venous insufficiency.  PLAN OF TREATMENT:  Right now, we need to debride a little bit. With Santyl and Unna boots, we will see her in 7 days.     Elesa Hacker, M.D.     RA/MEDQ  D:  06/18/2011  T:  06/18/2011  Job:  [id number]

## 2011-07-09 ENCOUNTER — Encounter (HOSPITAL_BASED_OUTPATIENT_CLINIC_OR_DEPARTMENT_OTHER): Payer: Medicaid Other | Attending: General Surgery

## 2011-07-09 DIAGNOSIS — I10 Essential (primary) hypertension: Secondary | ICD-10-CM | POA: Insufficient documentation

## 2011-07-09 DIAGNOSIS — I872 Venous insufficiency (chronic) (peripheral): Secondary | ICD-10-CM | POA: Insufficient documentation

## 2011-07-09 DIAGNOSIS — E669 Obesity, unspecified: Secondary | ICD-10-CM | POA: Insufficient documentation

## 2011-07-09 DIAGNOSIS — L97809 Non-pressure chronic ulcer of other part of unspecified lower leg with unspecified severity: Secondary | ICD-10-CM | POA: Insufficient documentation

## 2011-07-16 ENCOUNTER — Encounter (HOSPITAL_BASED_OUTPATIENT_CLINIC_OR_DEPARTMENT_OTHER): Payer: Medicaid Other

## 2011-08-05 ENCOUNTER — Other Ambulatory Visit: Payer: Self-pay | Admitting: *Deleted

## 2011-08-05 MED ORDER — METFORMIN HCL 1000 MG PO TABS: 1000.0000 mg | ORAL_TABLET | Freq: Two times a day (BID) | ORAL | Status: DC

## 2011-08-06 ENCOUNTER — Encounter (HOSPITAL_BASED_OUTPATIENT_CLINIC_OR_DEPARTMENT_OTHER): Payer: Medicaid Other | Attending: General Surgery

## 2011-08-06 ENCOUNTER — Encounter (HOSPITAL_BASED_OUTPATIENT_CLINIC_OR_DEPARTMENT_OTHER): Payer: Medicaid Other

## 2011-08-06 DIAGNOSIS — L97809 Non-pressure chronic ulcer of other part of unspecified lower leg with unspecified severity: Secondary | ICD-10-CM | POA: Insufficient documentation

## 2011-08-06 DIAGNOSIS — L089 Local infection of the skin and subcutaneous tissue, unspecified: Secondary | ICD-10-CM | POA: Insufficient documentation

## 2011-08-06 DIAGNOSIS — I872 Venous insufficiency (chronic) (peripheral): Secondary | ICD-10-CM | POA: Insufficient documentation

## 2011-08-09 ENCOUNTER — Encounter (HOSPITAL_BASED_OUTPATIENT_CLINIC_OR_DEPARTMENT_OTHER): Payer: Medicaid Other

## 2011-08-20 ENCOUNTER — Telehealth: Payer: Self-pay | Admitting: *Deleted

## 2011-08-20 NOTE — Telephone Encounter (Signed)
Reviewed chart

## 2011-08-24 ENCOUNTER — Encounter: Payer: Self-pay | Admitting: Internal Medicine

## 2011-08-24 ENCOUNTER — Ambulatory Visit (INDEPENDENT_AMBULATORY_CARE_PROVIDER_SITE_OTHER): Payer: Medicaid Other | Admitting: Internal Medicine

## 2011-08-24 ENCOUNTER — Encounter: Payer: Self-pay | Admitting: Gastroenterology

## 2011-08-24 VITALS — BP 123/73 | HR 88 | Temp 97.2°F | Ht 63.5 in | Wt 274.1 lb

## 2011-08-24 DIAGNOSIS — E785 Hyperlipidemia, unspecified: Secondary | ICD-10-CM

## 2011-08-24 DIAGNOSIS — E119 Type 2 diabetes mellitus without complications: Secondary | ICD-10-CM

## 2011-08-24 DIAGNOSIS — D518 Other vitamin B12 deficiency anemias: Secondary | ICD-10-CM

## 2011-08-24 DIAGNOSIS — M25562 Pain in left knee: Secondary | ICD-10-CM

## 2011-08-24 DIAGNOSIS — I83009 Varicose veins of unspecified lower extremity with ulcer of unspecified site: Secondary | ICD-10-CM

## 2011-08-24 DIAGNOSIS — K219 Gastro-esophageal reflux disease without esophagitis: Secondary | ICD-10-CM

## 2011-08-24 DIAGNOSIS — M1712 Unilateral primary osteoarthritis, left knee: Secondary | ICD-10-CM

## 2011-08-24 DIAGNOSIS — M1710 Unilateral primary osteoarthritis, unspecified knee: Secondary | ICD-10-CM

## 2011-08-24 DIAGNOSIS — Z1211 Encounter for screening for malignant neoplasm of colon: Secondary | ICD-10-CM

## 2011-08-24 DIAGNOSIS — L97909 Non-pressure chronic ulcer of unspecified part of unspecified lower leg with unspecified severity: Secondary | ICD-10-CM

## 2011-08-24 DIAGNOSIS — Z79899 Other long term (current) drug therapy: Secondary | ICD-10-CM

## 2011-08-24 DIAGNOSIS — M25569 Pain in unspecified knee: Secondary | ICD-10-CM

## 2011-08-24 DIAGNOSIS — G4733 Obstructive sleep apnea (adult) (pediatric): Secondary | ICD-10-CM

## 2011-08-24 LAB — GLUCOSE, CAPILLARY: Glucose-Capillary: 101 mg/dL — ABNORMAL HIGH (ref 70–99)

## 2011-08-24 MED ORDER — PRAVASTATIN SODIUM 20 MG PO TABS: 20.0000 mg | ORAL_TABLET | Freq: Every day | ORAL | Status: DC

## 2011-08-24 MED ORDER — PANTOPRAZOLE SODIUM 20 MG PO TBEC: 20.0000 mg | DELAYED_RELEASE_TABLET | Freq: Two times a day (BID) | ORAL | Status: DC

## 2011-08-24 NOTE — Assessment & Plan Note (Signed)
Iron stores repleted, pt no longer on iron supplementation. Continues B12 supplementation, last levels were borderline-low. - Will check CBC again 02/2011

## 2011-08-24 NOTE — Assessment & Plan Note (Signed)
At goal during last visit in 02/2011, LDL 99  HDL 40. - Continue pravachol - recheck FLP 02/2012

## 2011-08-24 NOTE — Assessment & Plan Note (Signed)
No known family h/o colon cancer. Discussed options of screening, pt agreed to colonoscopy. - Referral made.

## 2011-08-24 NOTE — Patient Instructions (Signed)
1. Please continue taking all of your medications and following your new diet.  2. Please continue going to wound care appointments for your leg ulcers. 3. We have made a referral for you to see the gastroenterologist for a routine colonoscopy to screen for colon cancer. Someone will call you to schedule this appointment. 4. We have made a referral to [facility] for your knee pain. Someone will call you to schedule this appointment.

## 2011-08-24 NOTE — Assessment & Plan Note (Signed)
Lab Results  Component Value Date   HGBA1C 6.3 08/24/2011   HGBA1C 13.5* 10/14/2010   CREATININE 0.59 03/03/2011   CREATININE 0.55 07/15/2008   MICROALBUR 2.44* 03/03/2011   MICRALBCREAT 22.9 03/03/2011   CHOL 162 03/03/2011   HDL 40 03/03/2011   TRIG 114 03/03/2011    Last eye exam and foot exam: No results found for this basename: HMDIABEYEEXA,  HMDIABFOOTEX    Assessment: Diabetes control: controlled Progress toward goals: improved Barriers to meeting goals: no barriers identified  Plan: Diabetes treatment: continue current medications Refer to: none Instruction/counseling given: discussed foot care and discussed diet Last eye exam 2 months ago, no diabetic foot ulcers

## 2011-08-24 NOTE — Assessment & Plan Note (Signed)
Pain w full extension. No obvious effusion or erythema.  Pt previously seen by [facility], needs referral as it has been >2 yrs since last appt. Diabetes under good control, she would be candidate for steroid injection if ortho thinks appropriate.  - Referral made.

## 2011-08-24 NOTE — Assessment & Plan Note (Signed)
Compliant w CPAP. Losing weight.

## 2011-08-24 NOTE — Progress Notes (Signed)
Subjective:   [name]   DOB: [birth year] 51 y.o.   MRN: [id number]  HPI: [name] is a 51 y.o. female w T2DM, OSA, venous stasis dz and B12 deficiency presenting for follow-up. She was diagnosed w DM in 2012 at which time her HbA1c was 13.5. She is on metformin therapy 1000 BID and tolerating it well. She has met with [name] and says she is employing the diet/exercise recommendations she made. Her A1c has been downtrending since that time and is 6.3 today. She does not check blood glucose at home. No symptoms suggestive of hypoglycemia (N/V, dizziness, sweating, palpitations). Denies polyuria. Getting regular eye exams, last exam 2 mo ago. No diabetic foot ulcers. No microalbuminuria.  Was recently seen in clinic for infected venous stasis ulcers of bilateral legs. She improved w a course of Doxycycline and has wound care appts weekly.  Only additional complaint is L knee pain, for which she has seen [name] in the past and had steroid injections. Pt is due for colon cancer screening and open to colonoscopy.      Past Medical History  Diagnosis Date  . OSA (obstructive sleep apnea)     CPAP  . Venous stasis ulcer     chornic, ?followed up at wound care center, multiple courses of antibiotics in past for cellulitis, on lasix  . GERD (gastroesophageal reflux disease)   . Anemia, iron deficiency     secondary to menhorrhagia, on oral iron, also b12 def, getting monthly b12 shots  . Chronic cough     secondary to alleriges and post nasal drip  . Depression   . Diabetes mellitus   . Hyperlipidemia    Current Outpatient Prescriptions  Medication Sig Dispense Refill  . albuterol (PROVENTIL HFA;VENTOLIN HFA) 108 (90 BASE) MCG/ACT inhaler Inhale 2 puffs into the lungs every 6 (six) hours as needed for wheezing.  1 Inhaler  6  . ferrous gluconate (FERGON) 325 MG tablet Take 325 mg by mouth daily with breakfast.        . fluticasone (FLONASE)  50 MCG/ACT nasal spray Place 2 sprays into the nose daily.  16 g  3  . furosemide (LASIX) 20 MG tablet Take 1 tablet (20 mg total) by mouth daily.  90 tablet  4  . metFORMIN (GLUCOPHAGE) 1000 MG tablet Take 1 tablet (1,000 mg total) by mouth 2 (two) times daily with a meal. Change in the tablet strength.  Only take 1 tablet at a time.  60 tablet  11  . nystatin-triamcinolone (MYCOLOG) ointment Apply topically 2 (two) times daily.  60 g  0  . pantoprazole (PROTONIX) 20 MG tablet Take 1 tablet (20 mg total) by mouth 2 (two) times daily.  180 tablet  4  . pravastatin (PRAVACHOL) 20 MG tablet Take 1 tablet (20 mg total) by mouth daily.  30 tablet  6  . vitamin B-12 (CYANOCOBALAMIN) 250 MCG tablet TAKE ONE TABLET BY MOUTH EVERY DAY  31 tablet  11   Current Facility-Administered Medications  Medication Dose Route Frequency Provider Last Rate Last Dose  . ipratropium (ATROVENT) 0.02 % nebulizer solution 0.5 mg  0.5 mg Nebulization 1 day or 1 dose Coralee Pesa, MD   0.5 mg at 06/30/10 1456  . pneumococcal 23 valent vaccine (PNU-IMMUNE) injection 0.5 mL  0.5 mL Intramuscular Once Acquanetta Chain, DO       Family History  Problem Relation Age of Onset  . Mental illness  Sister   . Mental retardation Brother    History   Social History  . Marital Status: Single    Spouse Name: N/A    Number of Children: N/A  . Years of Education: N/A   Social History Main Topics  . Smoking status: Former Research scientist (life sciences)  . Smokeless tobacco: Never Used  . Alcohol Use: No  . Drug Use: No  . Sexually Active: No   Other Topics Concern  . None   Social History Narrative  . None   Review of Systems: Constitutional: Denies fever, chills, diaphoresis, appetite change and fatigue.  HEENT: Denies eye pain, redness, hearing loss, ear pain, congestion, sore throat, rhinorrhea, sneezing, mouth sores, trouble swallowing, neck pain, neck stiffness    Respiratory: Denies SOB, DOE, cough, chest tightness,  and wheezing.    Cardiovascular: Denies chest pain, palpitations Gastrointestinal: Denies nausea, vomiting, abdominal pain, diarrhea, constipation, blood in stool and abdominal distention.  Genitourinary: Denies dysuria, urgency, frequency, hematuria, flank pain and difficulty urinating.  Musculoskeletal: L knee pain w prolonged walking as per HPI. Denies myalgias, joint swelling Skin: Venous stasis dz bilateral legs. Denies rash Neurological: Denies dizziness, seizures, syncope, weakness, light-headedness, numbness and headaches.  Hematological: Denies adenopathy.  Psychiatric/Behavioral: Denies mood changes  Objective:  Physical Exam: Filed Vitals:   08/24/11 1435  BP: 123/73  Pulse: 88  Temp: 97.2 F (36.2 C)  TempSrc: Oral  Height: 5' 3.5" (1.613 m)  Weight: 274 lb 1.6 oz (124.331 kg)  SpO2: 94%   Constitutional: Vital signs reviewed.  Patient is an obese female in no acute distress and cooperative with exam. Alert and oriented x3.  Head: Normocephalic and atraumatic Ear: TM normal bilaterally Mouth: no erythema or exudates, MMM Eyes: PERRL, EOMI, conjunctivae normal, No scleral icterus.  Neck: Supple, Trachea midline normal ROM, No JVD, mass, thyromegaly, or carotid bruit present.  Cardiovascular: RRR, S1 normal, S2 normal, no MRG, pulses symmetric and intact bilaterally Pulmonary/Chest: CTAB, no wheezes, rales, or rhonchi Abdominal: Soft. Non-tender, non-distended, bowel sounds are normal, no masses, organomegaly, or guarding present.  GU: no CVA tenderness Musculoskeletal: No joint effusions or erythema. Pain of L knee joint on full extension.  Hematology: no cervical, supraclavicular or axillary LAD Neurological: A&O x3, Strength is normal and symmetric bilaterally, cranial nerve II-XII are grossly intact, no focal motor deficit, sensory intact to light touch bilaterally.  Skin: Multiple hypopigmented patches photodistributed over arms and chest, chronic sun damage. Bilateral LE bandaged  from ankle to knee over venous stasis ulcers. 1+ edema of bilateral feet, no foot ulcers.  Assessment & Plan:

## 2011-08-24 NOTE — Assessment & Plan Note (Signed)
Pt w bilateral venous stasis dz previous seen in our clinic in June for infected ulcers. Improved w course of doxycycline and since receiving weekly evaluation and treatment in the wound care clinic. Has problem w TED hose because of tourniquet effect at the knee. Had ABIs done which were wnl. - Continue weekly wound care clinic visits

## 2011-08-24 NOTE — Assessment & Plan Note (Signed)
Occasional sx of heartburn. Taking PPI, not bothersome, does not awake from sleep. - continue protonix

## 2011-08-25 LAB — BASIC METABOLIC PANEL WITH GFR
CO2: 29 mEq/L (ref 19–32)
Calcium: 9.6 mg/dL (ref 8.4–10.5)
GFR, Est African American: >89 mL/min
Potassium: 4.3 mEq/L (ref 3.5–5.3)
Sodium: 140 mEq/L (ref 135–145)

## 2011-08-26 NOTE — Progress Notes (Signed)
agree

## 2011-08-31 ENCOUNTER — Ambulatory Visit (INDEPENDENT_AMBULATORY_CARE_PROVIDER_SITE_OTHER): Payer: Medicaid Other | Admitting: Dietician

## 2011-08-31 VITALS — Ht 63.5 in | Wt 272.6 lb

## 2011-08-31 DIAGNOSIS — E119 Type 2 diabetes mellitus without complications: Secondary | ICD-10-CM

## 2011-08-31 NOTE — Progress Notes (Signed)
24 hours recall: :  8 - 9 am 3 pancakes from box Complete mix, tub margarine, regular syrup and 1 cup skim milk, or 3/4 cup bowl cereal with 2 cup skim milk 10 am:  fruit cup either for breakfast or a snack 2-  PM chicken noodle soup, adds frozen vegetables, ready made chicken strips  Strawberry/kiwi drink 17 gr carb 3 pm - chewy granola bar, 1 chocolate cookie, peaches 7-8 PM sandwich size bowl Soup from lunch with broccoli added, vegetables, potatoes   Snack:  Rainbow sherbet- 16 oz, Caprisun Strawberry kiwi drink to take her meds,  peanut butter chocolate chip chewy granola bar, peach popsicle  Diabetes Self-Management Training (DSMT)  Follow-Up 7 Visit  08/31/2011 [name], identified by name and date of birth, is a 51 y.o. female with Type 2 Diabetes. Year of diabetes diagnosis: September 2012   ASSESSMENT Patient concerns are Nutrition/meal planning, Problem solving, Weight control and Support.  Height 5' 3.5" (1.613 m), weight 272 lb 9.6 oz (123.651 kg).  Labs reviewed. Patients belief/attitude about diabetes: Diabetes can be controlled.  Support systems: family- Legrand Como- her son lives with her Special needs: Large print, Simplified materials, Verbal instruction Cannot check feet because they are wrapped.   Medications See Medications list.  Has adequate knowledge    Exercise Plan Doing ADLs for 15 minutes a day.   Self-Monitoring Frequency of testing: not testing   Meal Planning Some knowledge- Eating lower fat and healthier choices, Limited monthly budget limits choices.   Assessment comments: Still has trouble sleeping. Naps during day. Continue to encourage healthy  eating and more activity to promote gradual weight loss.    INDIVIDUAL DIABETES EDUCATION PLAN:  Nutrition management Physical activity and exercise Goal setting _______________________________________________________________________  Intervention TOPICS COVERED TODAY:  Nutrition  management: balanced meals and healthy snacks reviewed.  Physical activity and exercise  Helped patient identify appropriate exercises in relation to his/her diabetes, diabetes complications and other health issue. Goal setting  Lifestyle issues that need to be addressed for better diabetes care  PATIENTS GOALS/PLAN (copy and paste in patient instructions so patient receives a copy): 1.  Learning Objective:       Demonstrate importance of physical activity and healthy eating 2.  Behavioral Objective: Nutrition: To improve blood glucose control I will follow meal plan of plate method- half a plate veggies/fruits, lower fat snacks most fo the time 75% Physical Activity: For improved blood glucose control and decrease insulin resistance, I will exercise 5 days a week  Sometimes 10% Problem Solving: To improve my blood glucose control, I will try attending a support group  Patient not interested in this goal at this time  Personalized Follow-Up Plan for Ongoing Self Management Support:  [place], family and CDE visits ______________________________________________________________________   Outcomes Expected outcomes: Demonstrated interest in learning.Expect positive changes in lifestyle. Self-care Barriers: Low literacy, Lack of transportation, Lack of material resources, Coping skills Education material provided: yes Patient to contact team via Phone if problems or questions. Time in: 130     Time out: 200  Future DSMT - 6 months   [place], [name]

## 2011-08-31 NOTE — Patient Instructions (Addendum)
Use whole grain macaroni or pasta instead of ramen noodles  Drink more water with lemon or diet drinks  instead of caprisun drinks/jiuce.  Try drinking smaller amounts of fluids at night to help you sleep better. (limit to 4 oz after 6 PM at night)  Try going to bed by midnight.   Please make a follow up with me in 6 months. Bring bill in from [place] to show to our Development worker, community.

## 2011-09-10 ENCOUNTER — Encounter (HOSPITAL_BASED_OUTPATIENT_CLINIC_OR_DEPARTMENT_OTHER): Payer: Medicaid Other | Attending: General Surgery

## 2011-09-10 DIAGNOSIS — E669 Obesity, unspecified: Secondary | ICD-10-CM | POA: Insufficient documentation

## 2011-09-10 DIAGNOSIS — I872 Venous insufficiency (chronic) (peripheral): Secondary | ICD-10-CM | POA: Insufficient documentation

## 2011-09-10 DIAGNOSIS — L97809 Non-pressure chronic ulcer of other part of unspecified lower leg with unspecified severity: Secondary | ICD-10-CM | POA: Insufficient documentation

## 2011-09-21 ENCOUNTER — Ambulatory Visit (AMBULATORY_SURGERY_CENTER): Payer: Medicaid Other

## 2011-09-21 VITALS — Ht 62.5 in | Wt 263.9 lb

## 2011-09-21 DIAGNOSIS — Z1211 Encounter for screening for malignant neoplasm of colon: Secondary | ICD-10-CM

## 2011-09-21 MED ORDER — MOVIPREP 100 G PO SOLR: ORAL | Status: DC

## 2011-09-21 NOTE — Progress Notes (Signed)
Pt came into the office today for her pre-visit prior to her colonoscopy with Dr Ardis Hughs on 11-08-11. The pt did not have any one with her, but did well on understanding all the directions and prep instructions.She will call our office if she has any problems or further questions.

## 2011-10-05 ENCOUNTER — Other Ambulatory Visit: Payer: Medicaid Other | Admitting: Gastroenterology

## 2011-10-08 ENCOUNTER — Encounter (HOSPITAL_BASED_OUTPATIENT_CLINIC_OR_DEPARTMENT_OTHER): Payer: Medicaid Other | Attending: General Surgery

## 2011-10-08 DIAGNOSIS — I872 Venous insufficiency (chronic) (peripheral): Secondary | ICD-10-CM | POA: Insufficient documentation

## 2011-10-08 DIAGNOSIS — L97909 Non-pressure chronic ulcer of unspecified part of unspecified lower leg with unspecified severity: Secondary | ICD-10-CM | POA: Insufficient documentation

## 2011-10-12 ENCOUNTER — Encounter (HOSPITAL_COMMUNITY): Payer: Self-pay | Admitting: *Deleted

## 2011-10-12 ENCOUNTER — Emergency Department (HOSPITAL_COMMUNITY)
Admission: EM | Admit: 2011-10-12 | Discharge: 2011-10-12 | Disposition: A | Discharge status: Home / Self Care | Payer: Medicaid Other | Attending: Emergency Medicine | Admitting: Emergency Medicine

## 2011-10-12 DIAGNOSIS — F3289 Other specified depressive episodes: Secondary | ICD-10-CM | POA: Insufficient documentation

## 2011-10-12 DIAGNOSIS — B86 Scabies: Secondary | ICD-10-CM | POA: Insufficient documentation

## 2011-10-12 DIAGNOSIS — Z882 Allergy status to sulfonamides status: Secondary | ICD-10-CM | POA: Insufficient documentation

## 2011-10-12 DIAGNOSIS — Z885 Allergy status to narcotic agent status: Secondary | ICD-10-CM | POA: Insufficient documentation

## 2011-10-12 DIAGNOSIS — Z888 Allergy status to other drugs, medicaments and biological substances status: Secondary | ICD-10-CM | POA: Insufficient documentation

## 2011-10-12 DIAGNOSIS — G4733 Obstructive sleep apnea (adult) (pediatric): Secondary | ICD-10-CM | POA: Insufficient documentation

## 2011-10-12 DIAGNOSIS — F329 Major depressive disorder, single episode, unspecified: Secondary | ICD-10-CM | POA: Insufficient documentation

## 2011-10-12 DIAGNOSIS — K219 Gastro-esophageal reflux disease without esophagitis: Secondary | ICD-10-CM | POA: Insufficient documentation

## 2011-10-12 DIAGNOSIS — E119 Type 2 diabetes mellitus without complications: Secondary | ICD-10-CM | POA: Insufficient documentation

## 2011-10-12 MED ORDER — PERMETHRIN 5 % EX CREA: TOPICAL_CREAM | CUTANEOUS | Status: DC

## 2011-10-12 NOTE — ED Provider Notes (Signed)
History  Scribed for No att. providers found, the patient was seen in room Room/bed info not found. This chart was scribed by Truddie Coco. The patient's care started at 3:35 PM   CSN: [id number]  Arrival date & time 10/12/11  1518   None     Chief Complaint  Patient presents with  . Rash     Patient is a 51 y.o. female presenting with rash. The history is provided by the patient. No language interpreter was used.  Rash  This is a new problem. The current episode started more than 1 week ago. The problem has been gradually worsening. The problem is associated with nothing. There has been no fever. Affected Location: arms, legs, torso, and face. Associated symptoms include itching. She has tried nothing for the symptoms.   AINE STRYCHARZ is a 51 y.o. female who presents to the Emergency Department complaining of an itching rash all over her body that started about two weeks ago.  She denies swelling in throat or mouth.  She has had difficulty sleeping due to the itching.   She reports that the rash started on her legs.  Pt lives with her son and he does not have the rash.     Past Medical History  Diagnosis Date  . OSA (obstructive sleep apnea)     CPAP  . Venous stasis ulcer     chornic, ?followed up at wound care center, multiple courses of antibiotics in past for cellulitis, on lasix  . GERD (gastroesophageal reflux disease)   . Anemia, iron deficiency     secondary to menhorrhagia, on oral iron, also b12 def, getting monthly b12 shots  . Chronic cough     secondary to alleriges and post nasal drip  . Depression   . Diabetes mellitus   . Hyperlipidemia   . H/O mental retardation     Past Surgical History  Procedure Date  . Cholecystectomy     Family History  Problem Relation Age of Onset  . Mental illness Sister   . Mental retardation Brother     History  Substance Use Topics  . Smoking status: Never Smoker   . Smokeless tobacco: Never Used  . Alcohol Use:  No    OB History    Grav Para Term Preterm Abortions TAB SAB Ect Mult Living   _0 Review of Systems  Constitutional: Negative for fatigue.  HENT: Negative for congestion, sinus pressure and ear discharge.   Eyes: Negative for discharge.  Respiratory: Negative for cough.   Cardiovascular: Negative for chest pain.  Gastrointestinal: Negative for abdominal pain and diarrhea.  Genitourinary: Negative for frequency and hematuria.  Musculoskeletal: Negative for back pain.  Skin: Positive for itching and rash.  Neurological: Negative for seizures and headaches.  Hematological: Negative.   Psychiatric/Behavioral: Negative for hallucinations.    Allergies  Aspirin; Codeine; and Sulfonamide derivatives  Home Medications   Current Outpatient Rx  Name Route Sig Dispense Refill  . ALBUTEROL SULFATE HFA 108 (90 BASE) MCG/ACT IN AERS Inhalation Inhale 2 puffs into the lungs every 6 (six) hours as needed for wheezing. 1 Inhaler 6  . FERROUS GLUCONATE 325 MG PO TABS Oral Take 325 mg by mouth daily with breakfast.      . FLUTICASONE PROPIONATE 50 MCG/ACT NA SUSP Nasal Place 2 sprays into the nose daily. 16 g 3  . FUROSEMIDE 20 MG PO TABS Oral Take  1 tablet (20 mg total) by mouth daily. 90 tablet 4  . METFORMIN HCL 1000 MG PO TABS Oral Take 1 tablet (1,000 mg total) by mouth 2 (two) times daily with a meal. Change in the tablet strength.  Only take 1 tablet at a time. 60 tablet 11  . MOVIPREP 100 G PO SOLR  Moviprep as directed /no substituions 1 kit 0    Dispense as written.  . NYSTATIN-TRIAMCINOLONE 100000-0.1 UNIT/GM-% EX OINT Topical Apply topically 2 (two) times daily. 60 g 0  . PANTOPRAZOLE SODIUM 20 MG PO TBEC Oral Take 1 tablet (20 mg total) by mouth 2 (two) times daily. 180 tablet 4  . PRAVASTATIN SODIUM 20 MG PO TABS Oral Take 1 tablet (20 mg total) by mouth daily. 30 tablet 6  . VITAMIN B-12 250 MCG PO TABS  TAKE ONE TABLET BY MOUTH EVERY DAY 31 tablet 11    BP  120/77  Pulse 94  Temp 98.7 F (37.1 C) (Oral)  Resp 20  SpO2 94%  Physical Exam  Constitutional: She is oriented to person, place, and time. She appears well-developed.  HENT:  Head: Normocephalic.  Eyes: Conjunctivae normal are normal.  Neck: No tracheal deviation present.  Cardiovascular:  No murmur heard. Pulmonary/Chest: No respiratory distress.  Musculoskeletal: Normal range of motion.  Neurological: She is oriented to person, place, and time.  Skin: Skin is warm. Rash noted.       Rash on arms, torso, and legs.  Rash is scaly, dry, pruritic, could be scabies.     Psychiatric: She has a normal mood and affect.    ED Course  Procedures (including critical care time)  Labs Reviewed - No data to display No results found.   No diagnosis found.    MDM  The chart was scribed for me under my direct supervision.  I personally performed the history, physical, and medical decision making and all procedures in the evaluation of this patient.Maudry Diego, MD 10/12/11 [phone]

## 2011-10-12 NOTE — ED Notes (Signed)
Pt is here for itching rash which she has had for 2 weeks.  No swelling in throat or mouth.

## 2011-10-26 ENCOUNTER — Emergency Department (INDEPENDENT_AMBULATORY_CARE_PROVIDER_SITE_OTHER)
Admission: EM | Admit: 2011-10-26 | Discharge: 2011-10-26 | Disposition: A | Discharge status: Home / Self Care | Payer: Medicaid Other | Source: Home / Self Care | Attending: Family Medicine | Admitting: Family Medicine

## 2011-10-26 ENCOUNTER — Encounter (HOSPITAL_COMMUNITY): Payer: Self-pay | Admitting: *Deleted

## 2011-10-26 DIAGNOSIS — L03011 Cellulitis of right finger: Secondary | ICD-10-CM

## 2011-10-26 DIAGNOSIS — R05 Cough: Secondary | ICD-10-CM

## 2011-10-26 DIAGNOSIS — R062 Wheezing: Secondary | ICD-10-CM

## 2011-10-26 DIAGNOSIS — R21 Rash and other nonspecific skin eruption: Secondary | ICD-10-CM

## 2011-10-26 MED ORDER — PREDNISONE 20 MG PO TABS: 60.0000 mg | ORAL_TABLET | Freq: Every day | ORAL | Status: DC

## 2011-10-26 MED ORDER — PREDNISONE 20 MG PO TABS
60.0000 mg | ORAL_TABLET | Freq: Once | ORAL | Status: AC
  Administered 2011-10-26: 60 mg via ORAL

## 2011-10-26 MED ORDER — ALBUTEROL SULFATE (5 MG/ML) 0.5% IN NEBU
2.5000 mg | INHALATION_SOLUTION | Freq: Once | RESPIRATORY_TRACT | Status: AC
  Administered 2011-10-26: 2.5 mg via RESPIRATORY_TRACT

## 2011-10-26 MED ORDER — CEPHALEXIN 500 MG PO CAPS: 500.0000 mg | ORAL_CAPSULE | Freq: Three times a day (TID) | ORAL | Status: DC

## 2011-10-26 MED ORDER — IVERMECTIN 0.5 % EX LOTN: 1.0000 "application " | TOPICAL_LOTION | Freq: Once | CUTANEOUS | Status: DC

## 2011-10-26 MED ORDER — IPRATROPIUM BROMIDE 0.02 % IN SOLN
0.5000 mg | Freq: Once | RESPIRATORY_TRACT | Status: AC
  Administered 2011-10-26: 0.5 mg via RESPIRATORY_TRACT

## 2011-10-26 MED ORDER — FLUTICASONE PROPIONATE 50 MCG/ACT NA SUSP: 2.0000 | Freq: Every day | NASAL | Status: DC

## 2011-10-26 MED ORDER — PREDNISONE 50 MG PO TABS: 50.0000 mg | ORAL_TABLET | Freq: Every day | ORAL | Status: DC

## 2011-10-26 MED ORDER — ALBUTEROL SULFATE HFA 108 (90 BASE) MCG/ACT IN AERS: 2.0000 | INHALATION_SPRAY | Freq: Four times a day (QID) | RESPIRATORY_TRACT | Status: DC | PRN

## 2011-10-26 NOTE — ED Notes (Signed)
C/o cough, wheezing and SOB onset 2 days ago.  No hx. Asthma.  Gets dizzy when she bends over.  No fever.   Seen in ED  2 weeks ago for rash and was given a cream ( Permethrin) States she has been using it every day. I told her she was supposed to apply it head to toe at night and wash it off the next day and repeat in 1 week.  Has large scabs on her abdomen.

## 2011-10-26 NOTE — ED Provider Notes (Signed)
History     CSN: [id number]  Arrival date & time 10/26/11  1714   None     Chief Complaint  Patient presents with  . Cough    (Consider location/radiation/quality/duration/timing/severity/associated sxs/prior treatment) Patient is a 51 y.o. female presenting with cough. The history is provided by the patient.  Cough This is a new problem.  [name] is a 51 y.o. female who complains of onset of cough and cold symptoms for 2 days.  No sore throat + cough, non productive + pleuritic pain + wheezing + nasal congestion + post-nasal drainage + sinus pain/pressure No voice changes + chest congestion No itchy/red eyes No earache No hemoptysis + SOB + chills/sweats No fever No nausea No vomiting No abdominal pain No diarrhea No ill contacts  Additionally complains of itchy rash, prescribed permethrin with no change in symptoms.  Rash on hands, arms and chest.  Pt also complains of right fifth finger tenderness.  Past Medical History  Diagnosis Date  . OSA (obstructive sleep apnea)     CPAP  . Venous stasis ulcer     chornic, ?followed up at wound care center, multiple courses of antibiotics in past for cellulitis, on lasix  . GERD (gastroesophageal reflux disease)   . Anemia, iron deficiency     secondary to menhorrhagia, on oral iron, also b12 def, getting monthly b12 shots  . Chronic cough     secondary to alleriges and post nasal drip  . Depression   . Diabetes mellitus   . Hyperlipidemia   . H/O mental retardation     Past Surgical History  Procedure Date  . Cholecystectomy     Family History  Problem Relation Age of Onset  . Mental illness Sister   . Mental retardation Brother     History  Substance Use Topics  . Smoking status: Never Smoker   . Smokeless tobacco: Never Used  . Alcohol Use: No    OB History    Grav Para Term Preterm Abortions TAB SAB Ect Mult Living   _0 Review of Systems  Respiratory: Positive for  cough.   All other systems reviewed and are negative.    Allergies  Aspirin; Codeine; and Sulfonamide derivatives  Home Medications   Current Outpatient Rx  Name Route Sig Dispense Refill  . FERROUS GLUCONATE 325 MG PO TABS Oral Take 325 mg by mouth daily with breakfast.      . FUROSEMIDE 20 MG PO TABS Oral Take 1 tablet (20 mg total) by mouth daily. 90 tablet 4  . METFORMIN HCL 1000 MG PO TABS Oral Take 1 tablet (1,000 mg total) by mouth 2 (two) times daily with a meal. Change in the tablet strength.  Only take 1 tablet at a time. 60 tablet 11  . PANTOPRAZOLE SODIUM 20 MG PO TBEC Oral Take 1 tablet (20 mg total) by mouth 2 (two) times daily. 180 tablet 4  . VITAMIN B-12 250 MCG PO TABS  TAKE ONE TABLET BY MOUTH EVERY DAY 31 tablet 11  . ALBUTEROL SULFATE HFA 108 (90 BASE) MCG/ACT IN AERS Inhalation Inhale 2 puffs into the lungs every 6 (six) hours as needed for wheezing. 1 Inhaler 2  . CEPHALEXIN 500 MG PO CAPS Oral Take 1 capsule (500 mg total) by mouth 3 (three) times daily. 30 capsule 0  . FLUTICASONE PROPIONATE 50 MCG/ACT NA SUSP Nasal Place 2 sprays into the nose daily.  16 g 3  . IVERMECTIN 0.5 % EX LOTN Apply externally Apply 1 application topically once. May repeat in one week for three applications. 117 g 2  . MOVIPREP 100 G PO SOLR  Moviprep as directed /no substituions 1 kit 0    Dispense as written.  Marland Kitchen PRAVASTATIN SODIUM 20 MG PO TABS Oral Take 1 tablet (20 mg total) by mouth daily. 30 tablet 6  . PREDNISONE 50 MG PO TABS Oral Take 1 tablet (50 mg total) by mouth daily. 4 tablet 0    BP 105/70  Pulse 83  Temp 97.9 F (36.6 C) (Oral)  Resp 20  SpO2 97%  Physical Exam  Nursing note and vitals reviewed. Constitutional: She is oriented to person, place, and time. Vital signs are normal. She appears well-developed and well-nourished. She is active and cooperative.  HENT:  Head: Normocephalic.  Right Ear: Hearing, tympanic membrane, external ear and ear canal normal.    Left Ear: Hearing, tympanic membrane, external ear and ear canal normal.  Nose: Nose normal. Right sinus exhibits no maxillary sinus tenderness and no frontal sinus tenderness. Left sinus exhibits no maxillary sinus tenderness and no frontal sinus tenderness.  Mouth/Throat: Uvula is midline, oropharynx is clear and moist and mucous membranes are normal.       Partially occluded left TM with cerumen.  Eyes: Conjunctivae normal and EOM are normal. Pupils are equal, round, and reactive to light. No scleral icterus.  Neck: Trachea normal, normal range of motion and full passive range of motion without pain. Neck supple.  Cardiovascular: Normal rate, regular rhythm, normal heart sounds, intact distal pulses and normal pulses.   Pulmonary/Chest: Effort normal. She has decreased breath sounds.  Musculoskeletal:       Hands: Lymphadenopathy:       Head (right side): No submental, no submandibular, no tonsillar, no preauricular, no posterior auricular and no occipital adenopathy present.       Head (left side): No submental, no submandibular, no tonsillar, no preauricular, no posterior auricular and no occipital adenopathy present.    She has no cervical adenopathy.  Neurological: She is alert and oriented to person, place, and time. She has normal strength. No cranial nerve deficit or sensory deficit. GCS eye subscore is 4. GCS verbal subscore is 5. GCS motor subscore is 6.  Skin: Skin is warm and dry. Rash noted. Rash is papular.          Erythematous base, appears cellulitic in some areas.  Psychiatric: She has a normal mood and affect. Her speech is normal and behavior is normal. Judgment and thought content normal. Cognition and memory are normal.    ED Course  Procedures (including critical care time)  Labs Reviewed - No data to display No results found.   1. Cough   2. Wheeze   3. Paronychia of fifth finger, right   4. Rash and nonspecific skin eruption       MDM  Medications as  prescribed.  Discussed with patient correct administration of ivermectin as well as environmental measures to take.  Follow up with primary care provider to ensure resolution of symptoms.        Awilda Metro, NP 10/26/11 2025

## 2011-10-27 NOTE — ED Provider Notes (Signed)
Medical screening examination/treatment/procedure(s) were performed by non-physician practitioner and as supervising physician I was immediately available for consultation/collaboration.   [facility]; MD   Randa Spike, MD 10/27/11 [phone]

## 2011-11-05 ENCOUNTER — Encounter (HOSPITAL_BASED_OUTPATIENT_CLINIC_OR_DEPARTMENT_OTHER): Payer: Medicaid Other | Attending: General Surgery

## 2011-11-05 DIAGNOSIS — L97909 Non-pressure chronic ulcer of unspecified part of unspecified lower leg with unspecified severity: Secondary | ICD-10-CM | POA: Insufficient documentation

## 2011-11-05 DIAGNOSIS — I87319 Chronic venous hypertension (idiopathic) with ulcer of unspecified lower extremity: Secondary | ICD-10-CM | POA: Insufficient documentation

## 2011-11-08 ENCOUNTER — Ambulatory Visit (AMBULATORY_SURGERY_CENTER): Payer: Medicaid Other | Admitting: Gastroenterology

## 2011-11-08 ENCOUNTER — Encounter: Payer: Self-pay | Admitting: Gastroenterology

## 2011-11-08 VITALS — BP 116/51 | HR 95 | Temp 97.7°F | Resp 32 | Ht 62.5 in | Wt 263.0 lb

## 2011-11-08 DIAGNOSIS — Z1211 Encounter for screening for malignant neoplasm of colon: Secondary | ICD-10-CM

## 2011-11-08 MED ORDER — SODIUM CHLORIDE 0.9 % IV SOLN: 500.0000 mL | INTRAVENOUS | Status: DC

## 2011-11-08 NOTE — Patient Instructions (Addendum)
Discharge instructions given with verbal understanding. Normal exam. Resume previous medications. YOU HAD AN ENDOSCOPIC PROCEDURE TODAY AT THE [facility]: Refer to the procedure report that was given to you for any specific questions about what was found during the examination.  If the procedure report does not answer your questions, please call your gastroenterologist to clarify.  If you requested that your care partner not be given the details of your procedure findings, then the procedure report has been included in a sealed envelope for you to review at your convenience later.  YOU SHOULD EXPECT: Some feelings of bloating in the abdomen. Passage of more gas than usual.  Walking can help get rid of the air that was put into your GI tract during the procedure and reduce the bloating. If you had a lower endoscopy (such as a colonoscopy or flexible sigmoidoscopy) you may notice spotting of blood in your stool or on the toilet paper. If you underwent a bowel prep for your procedure, then you may not have a normal bowel movement for a few days.  DIET: Your first meal following the procedure should be a light meal and then it is ok to progress to your normal diet.  A half-sandwich or bowl of soup is an example of a good first meal.  Heavy or fried foods are harder to digest and may make you feel nauseous or bloated.  Likewise meals heavy in dairy and vegetables can cause extra gas to form and this can also increase the bloating.  Drink plenty of fluids but you should avoid alcoholic beverages for 24 hours.  ACTIVITY: Your care partner should take you home directly after the procedure.  You should plan to take it easy, moving slowly for the rest of the day.  You can resume normal activity the day after the procedure however you should NOT DRIVE or use heavy machinery for 24 hours (because of the sedation medicines used during the test).    SYMPTOMS TO REPORT IMMEDIATELY: A gastroenterologist  can be reached at any hour.  During normal business hours, 8:30 AM to 5:00 PM Monday through Friday, call [phone].  After hours and on weekends, please call the GI answering service at [phone] who will take a message and have the physician on call contact you.   Following lower endoscopy (colonoscopy or flexible sigmoidoscopy):  Excessive amounts of blood in the stool  Significant tenderness or worsening of abdominal pains  Swelling of the abdomen that is new, acute  Fever of 100F or higher  FOLLOW UP: If any biopsies were taken you will be contacted by phone or by letter within the next 1-3 weeks.  Call your gastroenterologist if you have not heard about the biopsies in 3 weeks.  Our staff will call the home number listed on your records the next business day following your procedure to check on you and address any questions or concerns that you may have at that time regarding the information given to you following your procedure. This is a courtesy call and so if there is no answer at the home number and we have not heard from you through the emergency physician on call, we will assume that you have returned to your regular daily activities without incident.  SIGNATURES/CONFIDENTIALITY: You and/or your care partner have signed paperwork which will be entered into your electronic medical record.  These signatures attest to the fact that that the information above on your After Visit Summary has been reviewed   and is understood.  Full responsibility of the confidentiality of this discharge information lies with you and/or your care-partner. 

## 2011-11-08 NOTE — Progress Notes (Signed)
Patient did not experience any of the following events: a burn prior to discharge; a fall within the facility; wrong site/side/patient/procedure/implant event; or a hospital transfer or hospital admission upon discharge from the facility. [phone]) Patient did not have preoperative order for IV antibiotic SSI prophylaxis. [phone])

## 2011-11-08 NOTE — Op Note (Signed)
[facility]  [address]. [place], [address]   COLONOSCOPY PROCEDURE REPORT  PATIENT: [name], [name]  MR#: [id number] BIRTHDATE: 1960/05/16 , 51  yrs. old GENDER: Female ENDOSCOPIST: [name], MD REFERRED BY:  Tonia Brooms, MD PROCEDURE DATE:  11/08/2011 PROCEDURE:   Colonoscopy, diagnostic ASA CLASS:   Class III INDICATIONS:average risk screening. MEDICATIONS: Fentanyl 50 mcg IV, Versed 6 mg IV, and These medications were titrated to patient response per physician's verbal order  DESCRIPTION OF PROCEDURE:   After the risks benefits and alternatives of the procedure were thoroughly explained, informed consent was obtained.  A digital rectal exam revealed no abnormalities of the rectum.   The LB [id number]  endoscope was introduced through the anus and advanced to the cecum, which was identified by both the appendix and ileocecal valve. No adverse events experienced.   The quality of the prep was good, using MoviPrep  The instrument was then slowly withdrawn as the colon was fully examined.    COLON FINDINGS: A normal appearing cecum, ileocecal valve, and appendiceal orifice were identified.  The ascending, hepatic flexure, transverse, splenic flexure, descending, sigmoid colon and rectum appeared unremarkable.  No polyps or cancers were seen. Retroflexed views revealed no abnormalities. The time to cecum=3 minutes 14 seconds.  Withdrawal time=8 minutes 07 seconds.  The scope was withdrawn and the procedure completed. COMPLICATIONS: There were no complications.  ENDOSCOPIC IMPRESSION: Normal colon No polyps or cancers  RECOMMENDATIONS: You should continue to follow colorectal cancer screening guidelines for "routine risk" patients with a repeat colonoscopy in 10 years. There is no need for FOBT (stool) testing for at least 5 years.   eSigned:  [name], MD 11/08/2011 9:42 AM

## 2011-11-08 NOTE — Progress Notes (Signed)
Inhaler with pt

## 2011-11-09 ENCOUNTER — Telehealth: Payer: Self-pay

## 2011-11-09 NOTE — Telephone Encounter (Signed)
  Follow up Call-  Call back number 11/08/2011  Post procedure Call Back phone  # [phone]  Permission to leave phone message Yes     Patient questions:  Do you have a fever, pain , or abdominal swelling? no Pain Score  0 *  Have you tolerated food without any problems? yes  Have you been able to return to your normal activities? yes  Do you have any questions about your discharge instructions: Diet   no Medications  no Follow up visit  no  Do you have questions or concerns about your Care? no  Actions: * If pain score is 4 or above: No action needed, pain <4.

## 2011-11-09 NOTE — Telephone Encounter (Signed)
Left message

## 2011-11-16 ENCOUNTER — Encounter: Payer: Self-pay | Admitting: Internal Medicine

## 2011-11-16 ENCOUNTER — Ambulatory Visit (INDEPENDENT_AMBULATORY_CARE_PROVIDER_SITE_OTHER): Payer: Medicaid Other | Admitting: Internal Medicine

## 2011-11-16 VITALS — BP 134/83 | HR 94 | Temp 96.8°F | Ht 63.0 in | Wt 277.4 lb

## 2011-11-16 DIAGNOSIS — I83009 Varicose veins of unspecified lower extremity with ulcer of unspecified site: Secondary | ICD-10-CM

## 2011-11-16 DIAGNOSIS — G4733 Obstructive sleep apnea (adult) (pediatric): Secondary | ICD-10-CM

## 2011-11-16 DIAGNOSIS — J309 Allergic rhinitis, unspecified: Secondary | ICD-10-CM | POA: Insufficient documentation

## 2011-11-16 DIAGNOSIS — Z79899 Other long term (current) drug therapy: Secondary | ICD-10-CM

## 2011-11-16 DIAGNOSIS — E119 Type 2 diabetes mellitus without complications: Secondary | ICD-10-CM

## 2011-11-16 DIAGNOSIS — M25562 Pain in left knee: Secondary | ICD-10-CM

## 2011-11-16 DIAGNOSIS — M25569 Pain in unspecified knee: Secondary | ICD-10-CM

## 2011-11-16 DIAGNOSIS — Z1211 Encounter for screening for malignant neoplasm of colon: Secondary | ICD-10-CM

## 2011-11-16 DIAGNOSIS — B86 Scabies: Secondary | ICD-10-CM | POA: Insufficient documentation

## 2011-11-16 LAB — POCT GLYCOSYLATED HEMOGLOBIN (HGB A1C): Hemoglobin A1C: 6.2

## 2011-11-16 LAB — GLUCOSE, CAPILLARY: Glucose-Capillary: 110 mg/dL — ABNORMAL HIGH (ref 70–99)

## 2011-11-16 MED ORDER — FLUTICASONE PROPIONATE 50 MCG/ACT NA SUSP: 2.0000 | Freq: Every day | NASAL | Status: DC

## 2011-11-16 MED ORDER — IVERMECTIN 0.5 % EX LOTN: 1.0000 "application " | TOPICAL_LOTION | Freq: Once | CUTANEOUS | Status: DC

## 2011-11-16 MED ORDER — LORATADINE 10 MG PO TABS: 10.0000 mg | ORAL_TABLET | Freq: Every day | ORAL | Status: DC | PRN

## 2011-11-16 NOTE — Assessment & Plan Note (Signed)
Patient was diagnosed with scabies in ED. She did not understand the directions for for application of permethrin cream at the time, and had been applying it to spots daily. She reports that the pruritus has improved. I'm not sure that this is actually scabies, and has certainly not been confirmed microscopically. However, will attempt to retreatment with topical ivermectin as patient had not correctly applied the first time.  I discussed appropriate application of cream with her and she voiced understanding. She was instructed to call the clinic if her symptoms do not improve in the next couple of weeks. She was also instructed on hygiene and aggressive cleaning and washing measures at home.

## 2011-11-16 NOTE — Assessment & Plan Note (Addendum)
Patient has had some runny nose, watery eyes and sneezing recently. She also reports postnasal drip that makes her cough. This cough made her seek attention in the emergency room in the past month. I am concerned for allergic rhinitis. I told her that if she has symptoms like this again, she can call our clinic and is not have to go to the emergency room for care. -Flonase refilled today, and loratadine prescribed.

## 2011-11-16 NOTE — Assessment & Plan Note (Signed)
Lab Results  Component Value Date   HGBA1C 6.2 11/16/2011   HGBA1C 13.5* 10/14/2010   CREATININE 0.70 08/24/2011   CREATININE 0.55 07/15/2008   MICROALBUR 2.44* 03/03/2011   MICRALBCREAT 22.9 03/03/2011   CHOL 162 03/03/2011   HDL 40 03/03/2011   TRIG 114 03/03/2011    Last eye exam and foot exam: Eye and foot exams up to date, no retinopathy/neuropathy  Assessment: Diabetes control: controlled Progress toward goals: at goal Barriers to meeting goals: no barriers identified  Plan: Diabetes treatment: continue current medications Refer to: none Instruction/counseling given: discussed foot care   Congratulated patient on her excellent control of diabetes. She is on metformin 1000 twice a day. She'll continue this therapy and can followup with me again in 6 months.

## 2011-11-16 NOTE — Assessment & Plan Note (Signed)
Knee was injected with steroid by orthopedics, which improved her pain. She will followup with them as needed. Her diabetes is under excellent control, should she need repeat steroid injection.

## 2011-11-16 NOTE — Assessment & Plan Note (Signed)
Bilateral venous stasis disease. Followed regularly in the wound care clinic. No recent evidence of superinfection or cellulitis. Continue wound care visits

## 2011-11-16 NOTE — Progress Notes (Addendum)
Subjective:   [name]   DOB: [birth year] 51 y.o.   MRN: [id number]  HPI: [name] is a 51 y.o. female with history of type 2 diabetes mellitus, chronic venous stasis disease, obstructive sleep apnea, and chronic allergic rhinitis presenting for followup. In regards to her diabetes, patient has been compliant with metformin therapy 1000 mg twice a day and dietary modifications as suggested by Ms. Plyler. Since then, her glucose control has been excellent HbA1c today 6.2. No symptoms of hypoglycemia. No symptoms of diabetic neuropathy and foot exam. She is followed by an ophthalmologist and is no evidence of diabetic retinopathy. In regards to her chronic venous stasis disease, she follows weekly wound clinic and does not report any acute worsening of disease. She has her wounds bandaged weekly. No fever or chills. In regards to her obstructive sleep apnea, she wears her CPAP nightly he reports improved energy. She also has chronic allergies and postnasal drip resulting chronic cough. She recently presented to the ED for this. I discussed with her in the future she can call our clinic for this type of problem and not to the emergency room. She also reports development of a pruritic lesions covering her abdomen arms and legs over the past month. She was evaluated in the ED and diagnosed with scabies, although this was not confirmed with microscopy. She is prescribed  permethrin topical cream, but did not understand instructions for taking it was applying spots nightly instead of 1 application to her entire body. She says she thinks that her lesions have improved and are less pruritic now.   Past Medical History  Diagnosis Date  . OSA (obstructive sleep apnea)     CPAP  . Venous stasis ulcer     chornic, ?followed up at wound care center, multiple courses of antibiotics in past for cellulitis, on lasix  . GERD (gastroesophageal reflux disease)   . Anemia, iron  deficiency     secondary to menhorrhagia, on oral iron, also b12 def, getting monthly b12 shots  . Chronic cough     secondary to alleriges and post nasal drip  . Depression   . Diabetes mellitus   . Hyperlipidemia   . H/O mental retardation    Current Outpatient Prescriptions  Medication Sig Dispense Refill  . albuterol (PROVENTIL HFA;VENTOLIN HFA) 108 (90 BASE) MCG/ACT inhaler Inhale 2 puffs into the lungs every 6 (six) hours as needed for wheezing.  1 Inhaler  2  . cephALEXin (KEFLEX) 500 MG capsule Take 1 capsule (500 mg total) by mouth 3 (three) times daily.  30 capsule  0  . ferrous gluconate (FERGON) 325 MG tablet Take 325 mg by mouth daily with breakfast.        . fluticasone (FLONASE) 50 MCG/ACT nasal spray Place 2 sprays into the nose daily.  16 g  3  . furosemide (LASIX) 20 MG tablet Take 1 tablet (20 mg total) by mouth daily.  90 tablet  4  . Ivermectin 0.5 % LOTN Apply 1 application topically once. Apply ENTIRE tube from neck down AT BEDTIME.  117 g  2  . loratadine (CLARITIN) 10 MG tablet Take 1 tablet (10 mg total) by mouth daily as needed for allergies.  30 tablet  2  . metFORMIN (GLUCOPHAGE) 1000 MG tablet Take 1 tablet (1,000 mg total) by mouth 2 (two) times daily with a meal. Change in the tablet strength.  Only take 1 tablet at a time.  60 tablet  11  . pantoprazole (PROTONIX) 20 MG tablet Take 1 tablet (20 mg total) by mouth 2 (two) times daily.  180 tablet  4  . pravastatin (PRAVACHOL) 20 MG tablet Take 1 tablet (20 mg total) by mouth daily.  30 tablet  6  . predniSONE (DELTASONE) 50 MG tablet Take 1 tablet (50 mg total) by mouth daily.  4 tablet  0  . vitamin B-12 (CYANOCOBALAMIN) 250 MCG tablet TAKE ONE TABLET BY MOUTH EVERY DAY  31 tablet  11  . [DISCONTINUED] fluticasone (FLONASE) 50 MCG/ACT nasal spray Place 2 sprays into the nose daily.  16 g  3   Current Facility-Administered Medications  Medication Dose Route Frequency Provider Last Rate Last Dose  .  ipratropium (ATROVENT) 0.02 % nebulizer solution 0.5 mg  0.5 mg Nebulization 1 day or 1 dose Coralee Pesa, MD   0.5 mg at 06/30/10 1456  . pneumococcal 23 valent vaccine (PNU-IMMUNE) injection 0.5 mL  0.5 mL Intramuscular Once Acquanetta Chain, DO       Family History  Problem Relation Age of Onset  . Mental illness Sister   . Mental retardation Brother    History   Social History  . Marital Status: Single    Spouse Name: N/A    Number of Children: N/A  . Years of Education: N/A   Social History Main Topics  . Smoking status: Never Smoker   . Smokeless tobacco: Never Used  . Alcohol Use: No  . Drug Use: No  . Sexually Active: No   Other Topics Concern  . None   Social History Narrative  . None   Review of Systems: Constitutional: Denies fever, chills, diaphoresis, appetite change and fatigue.  HEENT: Reports sneezing, runny nose, some watery eyes. No sore throat   Respiratory: Denies SOB, DOE, cough, chest tightness   Cardiovascular: Denies chest pain, palpitations Gastrointestinal: Denies nausea, vomiting, abdominal pain, diarrhea, constipation, blood in stool and abdominal distention.  Genitourinary: Denies dysuria, urgency, frequency, hematuria, flank pain and difficulty urinating.  Musculoskeletal: Left knee pain is improved status post steroid injection by orthopedics. No acute joint swelling or warmth noticed by patient.  Skin: Pruritic lesions on arms abdomen and thighs as per history of present illness. Neurological: Denies dizziness, seizures, syncope, weakness  Hematological: Denies adenopathy Psychiatric/Behavioral: Reports good mood Objective:  Physical Exam: Filed Vitals:   11/16/11 1411  BP: 134/83  Pulse: 94  Temp: 96.8 F (36 C)  TempSrc: Oral  Height: 5' 3" (1.6 m)  Weight: 277 lb 6.4 oz (125.828 kg)  SpO2: 94%   Constitutional: Vital signs reviewed.  Patient is a well-developed and well-nourished female in no acute distress and cooperative  with exam. Alert and oriented x3.  Head: Normocephalic and atraumatic Mouth: no erythema or exudates, MMM Eyes: PERRL, EOMI, conjunctivae normal, No scleral icterus.  Neck: Supple, Trachea midline normal ROM, No JVD or thyromegaly  Cardiovascular: RRR, S1 normal, S2 normal, no MRG, 2+ radial pulses Pulmonary/Chest: CTAB, no wheezes, rales, or rhonchi Abdominal: Soft. Non-tender, non-distended, bowel sounds are normal, no masses, organomegaly, or guarding present.  GU: no CVA tenderness Musculoskeletal: No joint deformities, erythema, or stiffness,  Hematology: no cervical, inginal, or axillary adenopathy.  Neurological: A&O x3, Strength is normal and symmetric bilaterally, cranial nerve II-XII are grossly intact, no focal motor deficit, sensory intact to light touch bilaterally.  Skin: Small, 5-10 mm excoriated lesions covering bilateral forearms, abdomen, and upper thighs. No purulent drainage, erythema, or warmth to suggest superinfection.  No lesions noted finger webspace. Bilateral lower sugar these wrapped tightly by wound care for patient's venous stasis disease.  Psychiatric: Speech is slow and loud, patient has mild cognitive impairment which is chronic. Assessment & Plan:  INTERNAL MEDICINE TEACHING ATTENDING ADDENDUM - Janell Quiet, MD: I personally saw and evaluated Ms Aarons in this clinic visit in conjunction with the resident, Dr. Sandy Salaam. I have discussed the patient's plan of care with Dr. Sandy Salaam during this visit. I have confirmed the physical exam findings and have read and agree with the clinic note including the plan.

## 2011-11-16 NOTE — Patient Instructions (Signed)
1. Apply the ENTIRE TUBE of ivermectin from your NECK DOWN YOUR ENTIRE BODY. Do this one time only, at night. If your spots don't get better in a couple of weeks, please call us. 2. Start taking claritin 1 pill daily for your allergies. 3. You are doing a GREAT job with your diabetes. Please continue all of your medications. 4. Come back to see me in 6 mo or sooner if you need.

## 2011-11-16 NOTE — Assessment & Plan Note (Signed)
Remains compliant with CPAP. Feels more energetic.

## 2011-11-16 NOTE — Assessment & Plan Note (Signed)
Referral was made at last visit for colonoscopy and patient completed exam. No colonic polyps or evidence of malignancy. Patient will need repeat screening in 10 years.

## 2011-11-26 ENCOUNTER — Telehealth: Payer: Self-pay | Admitting: *Deleted

## 2011-11-26 NOTE — Telephone Encounter (Signed)
Received PA request from pt's pharmacy for the sklice 1.6% which is non-preferred.  Preferred medications include permethrin cream 5%, Eurax 10%, Ovide 0.5%, Ulesfia 5%. Will forward to prescribing MD to change to one of the preferred medications unless contraindicated.  Please send rx to [facility] ([place])

## 2011-11-27 ENCOUNTER — Other Ambulatory Visit: Payer: Self-pay | Admitting: Internal Medicine

## 2011-11-27 MED ORDER — PERMETHRIN 5 % EX CREA: TOPICAL_CREAM | CUTANEOUS | Status: DC

## 2011-11-27 NOTE — Telephone Encounter (Signed)
Permethrin Rx sent to pharmacy.  Thanks [employer]

## 2011-12-09 ENCOUNTER — Encounter: Payer: Medicaid Other | Attending: Plastic Surgery | Admitting: *Deleted

## 2011-12-09 DIAGNOSIS — E119 Type 2 diabetes mellitus without complications: Secondary | ICD-10-CM | POA: Insufficient documentation

## 2011-12-09 DIAGNOSIS — L98499 Non-pressure chronic ulcer of skin of other sites with unspecified severity: Secondary | ICD-10-CM | POA: Insufficient documentation

## 2011-12-09 DIAGNOSIS — Z713 Dietary counseling and surveillance: Secondary | ICD-10-CM | POA: Insufficient documentation

## 2011-12-10 ENCOUNTER — Encounter (HOSPITAL_BASED_OUTPATIENT_CLINIC_OR_DEPARTMENT_OTHER): Payer: Medicaid Other | Attending: General Surgery

## 2011-12-10 DIAGNOSIS — I87319 Chronic venous hypertension (idiopathic) with ulcer of unspecified lower extremity: Secondary | ICD-10-CM | POA: Insufficient documentation

## 2011-12-10 DIAGNOSIS — L97909 Non-pressure chronic ulcer of unspecified part of unspecified lower leg with unspecified severity: Secondary | ICD-10-CM | POA: Insufficient documentation

## 2012-01-04 ENCOUNTER — Encounter (HOSPITAL_COMMUNITY): Payer: Self-pay | Admitting: *Deleted

## 2012-01-04 ENCOUNTER — Emergency Department (HOSPITAL_COMMUNITY)
Admission: EM | Admit: 2012-01-04 | Discharge: 2012-01-04 | Disposition: A | Discharge status: Home / Self Care | Payer: Medicaid Other | Attending: Emergency Medicine | Admitting: Emergency Medicine

## 2012-01-04 DIAGNOSIS — Z862 Personal history of diseases of the blood and blood-forming organs and certain disorders involving the immune mechanism: Secondary | ICD-10-CM | POA: Insufficient documentation

## 2012-01-04 DIAGNOSIS — L0390 Cellulitis, unspecified: Secondary | ICD-10-CM

## 2012-01-04 DIAGNOSIS — Z8669 Personal history of other diseases of the nervous system and sense organs: Secondary | ICD-10-CM | POA: Insufficient documentation

## 2012-01-04 DIAGNOSIS — E119 Type 2 diabetes mellitus without complications: Secondary | ICD-10-CM

## 2012-01-04 DIAGNOSIS — F79 Unspecified intellectual disabilities: Secondary | ICD-10-CM

## 2012-01-04 DIAGNOSIS — K219 Gastro-esophageal reflux disease without esophagitis: Secondary | ICD-10-CM | POA: Insufficient documentation

## 2012-01-04 DIAGNOSIS — L02219 Cutaneous abscess of trunk, unspecified: Secondary | ICD-10-CM | POA: Insufficient documentation

## 2012-01-04 DIAGNOSIS — F3289 Other specified depressive episodes: Secondary | ICD-10-CM | POA: Insufficient documentation

## 2012-01-04 DIAGNOSIS — R300 Dysuria: Secondary | ICD-10-CM | POA: Insufficient documentation

## 2012-01-04 DIAGNOSIS — Z79899 Other long term (current) drug therapy: Secondary | ICD-10-CM | POA: Insufficient documentation

## 2012-01-04 DIAGNOSIS — E785 Hyperlipidemia, unspecified: Secondary | ICD-10-CM | POA: Insufficient documentation

## 2012-01-04 DIAGNOSIS — Z8619 Personal history of other infectious and parasitic diseases: Secondary | ICD-10-CM | POA: Insufficient documentation

## 2012-01-04 DIAGNOSIS — E669 Obesity, unspecified: Secondary | ICD-10-CM | POA: Insufficient documentation

## 2012-01-04 DIAGNOSIS — L03319 Cellulitis of trunk, unspecified: Secondary | ICD-10-CM | POA: Insufficient documentation

## 2012-01-04 DIAGNOSIS — F329 Major depressive disorder, single episode, unspecified: Secondary | ICD-10-CM | POA: Insufficient documentation

## 2012-01-04 HISTORY — DX: Herpesviral infection, unspecified: B00.9

## 2012-01-04 MED ORDER — CLINDAMYCIN HCL 150 MG PO CAPS: 450.0000 mg | ORAL_CAPSULE | Freq: Three times a day (TID) | ORAL | Status: DC

## 2012-01-04 NOTE — ED Notes (Addendum)
Pt reports genital pain, dysuria x2 weeks. Reports "more bumps than normal" on genitals. Pt unsure if she is having another herpes outbreak. Pain 8/10. Hx of mental retardation.

## 2012-01-04 NOTE — ED Provider Notes (Signed)
History     CSN: [id number]  Arrival date & time 01/04/12  1453   First MD Initiated Contact with Patient 01/04/12 1633      Chief Complaint  Patient presents with  . genital pain   . Dysuria    (Consider location/radiation/quality/duration/timing/severity/associated sxs/prior treatment) The history is provided by the patient and medical records.    [name] is a 51 y.o. female  with a hx of OSA, venous status ulcer, GERD, depression, DM, HLD, herpes, mental retardation presents to the Emergency Department complaining of gradual, persistent, progressively worsening swelling of her "private parts" onset 2 weeks. Pt states she is so swollen that she is afraid she will get stuck when sitting on the toilet.  Pt . Associated symptoms include swelling of the groin, pain in the groin when sitting down, dysuria.  Nothing makes it better and nothing makes it worse.  Pt denies vaginal bleeding, vaginal discharge, fever, chills, headache, neck pain, nausea, vomiting, diarrhea.  No drainage from the site.  She states that because of the swelling it takes longer to urinate however she does not have any dysuria, urgency, urgency or hematuria.  She states this does not feel like her herpes outbreaks.   Past Medical History  Diagnosis Date  . OSA (obstructive sleep apnea)     CPAP  . Venous stasis ulcer     chornic, ?followed up at wound care center, multiple courses of antibiotics in past for cellulitis, on lasix  . GERD (gastroesophageal reflux disease)   . Anemia, iron deficiency     secondary to menhorrhagia, on oral iron, also b12 def, getting monthly b12 shots  . Chronic cough     secondary to alleriges and post nasal drip  . Depression   . Diabetes mellitus   . Hyperlipidemia   . H/O mental retardation   . Herpes     Past Surgical History  Procedure Date  . Cholecystectomy     Family History  Problem Relation Age of Onset  . Mental illness Sister   . Mental retardation  Brother     History  Substance Use Topics  . Smoking status: Never Smoker   . Smokeless tobacco: Never Used  . Alcohol Use: No    OB History    Grav Para Term Preterm Abortions TAB SAB Ect Mult Living   _0 Review of Systems  Constitutional: Negative for fever, diaphoresis, appetite change, fatigue and unexpected weight change.  HENT: Negative for mouth sores and neck stiffness.   Eyes: Negative for visual disturbance.  Respiratory: Negative for cough, chest tightness, shortness of breath and wheezing.   Cardiovascular: Negative for chest pain.  Gastrointestinal: Negative for nausea, vomiting, abdominal pain, diarrhea and constipation.  Genitourinary: Positive for dysuria and vaginal pain. Negative for urgency, frequency and hematuria.  Skin: Negative for rash.  Neurological: Negative for syncope, light-headedness and headaches.  Psychiatric/Behavioral: Negative for sleep disturbance. The patient is not nervous/anxious.   All other systems reviewed and are negative.    Allergies  Aspirin; Codeine; and Sulfonamide derivatives  Home Medications   Current Outpatient Rx  Name  Route  Sig  Dispense  Refill  . ALBUTEROL SULFATE HFA 108 (90 BASE) MCG/ACT IN AERS   Inhalation   Inhale 2 puffs into the lungs every 6 (six) hours as needed for wheezing.   1 Inhaler   2   . AMOXICILLIN 500 MG  PO CAPS   Oral   Take 500 mg by mouth 3 (three) times daily. Started on 12-26-11. Therapy for 10 days         . FLUTICASONE PROPIONATE 50 MCG/ACT NA SUSP   Nasal   Place 2 sprays into the nose daily.   16 g   3   . FUROSEMIDE 20 MG PO TABS   Oral   Take 1 tablet (20 mg total) by mouth daily.   90 tablet   4   . IBUPROFEN 800 MG PO TABS   Oral   Take 800 mg by mouth every 8 (eight) hours as needed. pain         . LORATADINE 10 MG PO TABS   Oral   Take 1 tablet (10 mg total) by mouth daily as needed for allergies.   30 tablet   2   . METFORMIN HCL 1000  MG PO TABS   Oral   Take 1 tablet (1,000 mg total) by mouth 2 (two) times daily with a meal. Change in the tablet strength.  Only take 1 tablet at a time.   60 tablet   11   . PANTOPRAZOLE SODIUM 20 MG PO TBEC   Oral   Take 1 tablet (20 mg total) by mouth 2 (two) times daily.   180 tablet   4   . PRAVASTATIN SODIUM 20 MG PO TABS   Oral   Take 1 tablet (20 mg total) by mouth daily.   30 tablet   6   . PREDNISONE 50 MG PO TABS   Oral   Take 1 tablet (50 mg total) by mouth daily.   4 tablet   0   . VITAMIN B-12 250 MCG PO TABS      TAKE ONE TABLET BY MOUTH EVERY DAY   31 tablet   11   . CLINDAMYCIN HCL 150 MG PO CAPS   Oral   Take 3 capsules (450 mg total) by mouth 3 (three) times daily.   90 capsule   0     BP 115/75  Pulse 93  Temp 97.7 F (36.5 C) (Oral)  Resp 16  SpO2 93%  Physical Exam  Nursing note and vitals reviewed. Constitutional: She is oriented to person, place, and time. She appears well-developed and well-nourished. No distress.       Obese female who appears older than stated age  HENT:  Head: Normocephalic and atraumatic.  Eyes: Conjunctivae normal are normal. No scleral icterus.  Neck: Normal range of motion. Neck supple.  Cardiovascular: Normal rate, regular rhythm, normal heart sounds and intact distal pulses.  Exam reveals no gallop and no friction rub.   No murmur heard. Pulmonary/Chest: Effort normal and breath sounds normal. No respiratory distress. She has no wheezes.  Abdominal: Soft. Bowel sounds are normal. She exhibits no mass. There is no hepatosplenomegaly. There is tenderness. There is guarding. There is no rebound and no CVA tenderness.         Pt with large panus  Genitourinary: Uterus normal.    Pelvic exam was performed with patient supine. There is no rash, tenderness or lesion on the right labia. There is no rash, tenderness or lesion on the left labia.       Edema, erythema and cellulitis of the mons pubis Swelling  and edema of the labia majora, no lesions, no visible vaginal discharge No discrete abscess   Musculoskeletal: Normal range of motion. She exhibits no edema.  Lymphadenopathy:  She has no cervical adenopathy.  Neurological: She is alert and oriented to person, place, and time. She exhibits normal muscle tone. Coordination normal.       Speech is clear and goal oriented Moves extremities without ataxia  Skin: Skin is warm and dry. She is not diaphoretic. There is erythema.  Psychiatric: She has a normal mood and affect.    ED Course  Procedures (including critical care time)  Labs Reviewed - No data to display No results found.   1. Cellulitis   2. Diabetes mellitus   3. Obesity   4. MENTAL RETARDATION       MDM  [name] presents with concerns for swelling of her genitalia.  Pt with mild cellulitis and edema of her pannus.  Will treat with outpatient antibiotics.  Pt does have diabetes and chronic wounds, therefore I discussed at length with her the need to return if this is not getting worse.  Pt is afebrile, non-tachycardic, NAD, nonseptic, nontoxic appearing. Patient does not meet SIRS or sepsis criteria.  I do not believe that she needs IV antibiotics at this time.  Patient with without discrete abscess, it is not amenable to incision and drainage.  Encouraged home hygiene and use of different positions to elevate her pannus to decrease swelling..  Mild signs of cellulitis  Will d/c to home with antibiotic therapy.    1. Medications: clindamycin, usual home medications 2. Treatment: rest, drink plenty of fluids, take medication as prescribed 3. Follow Up: Please followup with your primary doctor for discussion of your diagnoses and further evaluation after today's visit;         Abigail Butts, PA-C 01/04/12 [phone]

## 2012-01-04 NOTE — ED Provider Notes (Signed)
Medical screening examination/treatment/procedure(s) were conducted as a shared visit with non-physician practitioner(s) and myself.  I personally evaluated the patient during the encounter Patient is a morbidly obese patient with a pannus and swollen mons pubis that appears to have some cellulitis. There is no sign or area consistent with abscess at this time. Patient denies any systemic symptoms. She was treated for cellulitis.  Blanchie Dessert, MD 01/04/12 2329

## 2012-01-04 NOTE — ED Notes (Signed)
[id number]<QM> Expected date:<BR> Expected time:<BR> Means of arrival:<BR> Comments:<BR> Hold for triage [place]

## 2012-01-07 ENCOUNTER — Encounter (HOSPITAL_BASED_OUTPATIENT_CLINIC_OR_DEPARTMENT_OTHER): Payer: Medicaid Other

## 2012-01-14 ENCOUNTER — Encounter (HOSPITAL_BASED_OUTPATIENT_CLINIC_OR_DEPARTMENT_OTHER): Payer: Medicaid Other | Attending: General Surgery

## 2012-01-14 DIAGNOSIS — I872 Venous insufficiency (chronic) (peripheral): Secondary | ICD-10-CM | POA: Insufficient documentation

## 2012-01-14 DIAGNOSIS — I87309 Chronic venous hypertension (idiopathic) without complications of unspecified lower extremity: Secondary | ICD-10-CM | POA: Insufficient documentation

## 2012-01-14 DIAGNOSIS — L97809 Non-pressure chronic ulcer of other part of unspecified lower leg with unspecified severity: Secondary | ICD-10-CM | POA: Insufficient documentation

## 2012-01-19 ENCOUNTER — Other Ambulatory Visit (HOSPITAL_COMMUNITY)
Admission: RE | Admit: 2012-01-19 | Discharge: 2012-01-19 | Disposition: A | Discharge status: Home / Self Care | Payer: Medicaid Other | Source: Ambulatory Visit | Attending: Internal Medicine | Admitting: Internal Medicine

## 2012-01-19 ENCOUNTER — Ambulatory Visit (INDEPENDENT_AMBULATORY_CARE_PROVIDER_SITE_OTHER): Payer: Medicaid Other | Admitting: Internal Medicine

## 2012-01-19 ENCOUNTER — Encounter: Payer: Self-pay | Admitting: Internal Medicine

## 2012-01-19 ENCOUNTER — Encounter: Payer: Medicaid Other | Admitting: Internal Medicine

## 2012-01-19 VITALS — BP 134/83 | HR 100 | Temp 96.7°F | Ht 63.0 in | Wt 297.6 lb

## 2012-01-19 DIAGNOSIS — L0390 Cellulitis, unspecified: Secondary | ICD-10-CM

## 2012-01-19 DIAGNOSIS — R102 Pelvic and perineal pain: Secondary | ICD-10-CM

## 2012-01-19 DIAGNOSIS — N760 Acute vaginitis: Secondary | ICD-10-CM | POA: Insufficient documentation

## 2012-01-19 DIAGNOSIS — E119 Type 2 diabetes mellitus without complications: Secondary | ICD-10-CM

## 2012-01-19 LAB — GLUCOSE, CAPILLARY: Glucose-Capillary: 93 mg/dL (ref 70–99)

## 2012-01-19 MED ORDER — NYSTATIN 100000 UNIT/GM EX POWD: CUTANEOUS | Status: DC

## 2012-01-19 MED ORDER — CEPHALEXIN 500 MG PO CAPS: 500.0000 mg | ORAL_CAPSULE | Freq: Four times a day (QID) | ORAL | Status: DC

## 2012-01-19 MED ORDER — CEPHALEXIN 500 MG PO CAPS: 500.0000 mg | ORAL_CAPSULE | Freq: Four times a day (QID) | ORAL | Status: AC

## 2012-01-19 NOTE — Assessment & Plan Note (Signed)
Patient was diagnosed with cellulitis 2 weeks ago, with persistent pinkish erythema of suprapubic pannus, though less pain, with pruritis around pelvis/lower abdomen.  Symptoms likely represent resolving cellulitis (afebrile, no pain on palpation) with intertrigo in skin folds.  Cannot exclude some persistent cellulitis.  PEX findings not entirely consistent with scabes recurrence, though if symptoms do not improve this would be a consideration. -repeat course of antibiotics with keflex for 7 days -nystatin to skin folds for intertrigo

## 2012-01-19 NOTE — Patient Instructions (Addendum)
General Instructions: Your cellulitis appears to be improving.  We are prescribing another course of antibiotics to further treat this cellulitis.  Take Keflex, 1 tablet every 6 hours (4 times per day) for 7 days.  You also were noted to have skin irritation around the skin folds of your abdomen and groin.  Apply Nystatin powder 3 times per day to these areas for 1 week or until resolution of pain and itching.   Treatment Goals:  Goals (1 Years of Data) as of 01/19/2012          As of Today 01/04/12 11/16/11 11/08/11 11/08/11     Blood Pressure    . Blood Pressure < 140/90  134/83 115/75 134/83 116/51 117/52     Diet    . Eat more fruits and vegetables    Yes      . Reduce portion size    Yes       Lifestyle    . Increase physical activity    Yes       Result Component    . HEMOGLOBIN A1C < 7.0    6.2      . LDL CALC < 100            Progress Toward Treatment Goals:  Treatment Goal 01/19/2012  Hemoglobin A1C at goal    Self Care Goals & Plans:  Self Care Goal 01/19/2012  Manage my medications take my medicines as prescribed; bring my medications to every visit; refill my medications on time  Eat healthy foods drink diet soda or water instead of juice or soda; eat more vegetables       Care Management & Community Referrals:  Referral 01/19/2012  Referrals made for care management support none needed

## 2012-01-19 NOTE — Progress Notes (Signed)
HPI The patient is a 52 y.o. female with a history of DM, herpes, cellulitis diagnosed 2 weeks ago, scabes diagnosed 2 months ago, presenting with persistent lower abdominal/vaginal pain, swelling, and itching.  The patient was seen at [facility] ED 2 weeks ago, diagnosed with vaginal cellulitis.  She was given a prescription for clindamycin x10 days.  The patient took this medication (last dose 3 days ago), but still notes vaginal swelling, with less pain, though now with spread of swelling/pain/itching to her lower abdomen/intertiginous areas.  She has not noticed any vaginal discharge (but notes difficulty assessing this due to body habitus).  She notes dysuria "off and on", as well as urinary frequency.  2 months ago the patient was diagnosed with scabes, but reports taking permethrin cream with resolution of itching at that time, as well as thorough washing of clothes and household items.  She currently notes some itching on the new spots on her abdomen, but no itching on her extremities.  No fevers, chills.  ROS: General: no fevers, chills, changes in weight, changes in appetite Skin: see HPI HEENT: no blurry vision, hearing changes, sore throat Pulm: no dyspnea, coughing, wheezing CV: no chest pain, palpitations, shortness of breath Abd: no abdominal pain, nausea/vomiting, diarrhea/constipation GU: see HPI Ext: no arthralgias, myalgias Neuro: no weakness, numbness, or tingling  Filed Vitals:   01/19/12 1036  BP: 134/83  Pulse: 100  Temp: 96.7 F (35.9 C)    PEX General: alert, cooperative, and in no apparent distress HEENT: pupils equal round and reactive to light, vision grossly intact, oropharynx clear and non-erythematous  Neck: supple, no lymphadenopathy Lungs: clear to ascultation bilaterally, normal work of respiration, no wheezes, rales, ronchi Heart: regular rate and rhythm, no murmurs, gallops, or rubs Abdomen: obese, non-tender, non-distended, with large striae noted on lower  abdomen, and few areas of scab formation (secondary to scratching).  Increased erythema noted on skin folds of groin/abdomen. Bowel sounds present Pelvic: large pinkish erythema involving entire suprapubic pannus, with no tenderness to palpation or exudate noted.  Speculum exam shows no discharge, otherwise unremarkable. Extremities: no cyanosis, clubbing, or edema Neurologic: alert & oriented X3, cranial nerves II-XII intact, strength grossly intact, sensation intact to light touch  Current Outpatient Prescriptions on File Prior to Visit  Medication Sig Dispense Refill  . albuterol (PROVENTIL HFA;VENTOLIN HFA) 108 (90 BASE) MCG/ACT inhaler Inhale 2 puffs into the lungs every 6 (six) hours as needed for wheezing.  1 Inhaler  2  . amoxicillin (AMOXIL) 500 MG capsule Take 500 mg by mouth 3 (three) times daily. Started on 12-26-11. Therapy for 10 days      . clindamycin (CLEOCIN) 150 MG capsule Take 3 capsules (450 mg total) by mouth 3 (three) times daily.  90 capsule  0  . fluticasone (FLONASE) 50 MCG/ACT nasal spray Place 2 sprays into the nose daily.  16 g  3  . furosemide (LASIX) 20 MG tablet Take 1 tablet (20 mg total) by mouth daily.  90 tablet  4  . ibuprofen (ADVIL,MOTRIN) 800 MG tablet Take 800 mg by mouth every 8 (eight) hours as needed. pain      . loratadine (CLARITIN) 10 MG tablet Take 1 tablet (10 mg total) by mouth daily as needed for allergies.  30 tablet  2  . metFORMIN (GLUCOPHAGE) 1000 MG tablet Take 1 tablet (1,000 mg total) by mouth 2 (two) times daily with a meal. Change in the tablet strength.  Only take 1 tablet at a time.  60 tablet  11  . pantoprazole (PROTONIX) 20 MG tablet Take 1 tablet (20 mg total) by mouth 2 (two) times daily.  180 tablet  4  . pravastatin (PRAVACHOL) 20 MG tablet Take 1 tablet (20 mg total) by mouth daily.  30 tablet  6  . predniSONE (DELTASONE) 50 MG tablet Take 1 tablet (50 mg total) by mouth daily.  4 tablet  0  . vitamin B-12 (CYANOCOBALAMIN) 250  MCG tablet TAKE ONE TABLET BY MOUTH EVERY DAY  31 tablet  11   Current Facility-Administered Medications on File Prior to Visit  Medication Dose Route Frequency Provider Last Rate Last Dose  . ipratropium (ATROVENT) 0.02 % nebulizer solution 0.5 mg  0.5 mg Nebulization 1 day or 1 dose Coralee Pesa, MD   0.5 mg at 06/30/10 1456  . pneumococcal 23 valent vaccine (PNU-IMMUNE) injection 0.5 mL  0.5 mL Intramuscular Once Acquanetta Chain, DO        Assessment/Plan

## 2012-01-20 ENCOUNTER — Ambulatory Visit: Payer: Medicaid Other | Admitting: Internal Medicine

## 2012-01-20 LAB — URINALYSIS, ROUTINE W REFLEX MICROSCOPIC
Bilirubin Urine: NEGATIVE
Hgb urine dipstick: NEGATIVE
Ketones, ur: NEGATIVE mg/dL
Nitrite: NEGATIVE
Urobilinogen, UA: 0.2 mg/dL (ref 0.0–1.0)

## 2012-02-11 ENCOUNTER — Encounter (HOSPITAL_BASED_OUTPATIENT_CLINIC_OR_DEPARTMENT_OTHER): Payer: Medicaid Other | Attending: General Surgery

## 2012-02-11 ENCOUNTER — Encounter (HOSPITAL_COMMUNITY): Payer: Self-pay | Admitting: Emergency Medicine

## 2012-02-11 ENCOUNTER — Inpatient Hospital Stay (HOSPITAL_COMMUNITY)
Admission: EM | Admit: 2012-02-11 | Discharge: 2012-02-15 | DRG: 292 | Disposition: A | Discharge status: Home Health | Payer: Medicaid Other | Attending: Internal Medicine | Admitting: Internal Medicine

## 2012-02-11 ENCOUNTER — Emergency Department (HOSPITAL_COMMUNITY): Payer: Medicaid Other

## 2012-02-11 DIAGNOSIS — I998 Other disorder of circulatory system: Secondary | ICD-10-CM | POA: Diagnosis not present

## 2012-02-11 DIAGNOSIS — Z79899 Other long term (current) drug therapy: Secondary | ICD-10-CM

## 2012-02-11 DIAGNOSIS — Z9089 Acquired absence of other organs: Secondary | ICD-10-CM

## 2012-02-11 DIAGNOSIS — I83009 Varicose veins of unspecified lower extremity with ulcer of unspecified site: Secondary | ICD-10-CM | POA: Diagnosis present

## 2012-02-11 DIAGNOSIS — D518 Other vitamin B12 deficiency anemias: Secondary | ICD-10-CM

## 2012-02-11 DIAGNOSIS — K219 Gastro-esophageal reflux disease without esophagitis: Secondary | ICD-10-CM | POA: Diagnosis present

## 2012-02-11 DIAGNOSIS — E119 Type 2 diabetes mellitus without complications: Secondary | ICD-10-CM | POA: Diagnosis present

## 2012-02-11 DIAGNOSIS — I509 Heart failure, unspecified: Principal | ICD-10-CM | POA: Diagnosis present

## 2012-02-11 DIAGNOSIS — G4733 Obstructive sleep apnea (adult) (pediatric): Secondary | ICD-10-CM | POA: Diagnosis present

## 2012-02-11 DIAGNOSIS — R0609 Other forms of dyspnea: Secondary | ICD-10-CM

## 2012-02-11 DIAGNOSIS — B86 Scabies: Secondary | ICD-10-CM | POA: Diagnosis present

## 2012-02-11 DIAGNOSIS — Z6841 Body Mass Index (BMI) 40.0 and over, adult: Secondary | ICD-10-CM

## 2012-02-11 DIAGNOSIS — I808 Phlebitis and thrombophlebitis of other sites: Secondary | ICD-10-CM | POA: Diagnosis not present

## 2012-02-11 DIAGNOSIS — Z885 Allergy status to narcotic agent status: Secondary | ICD-10-CM

## 2012-02-11 DIAGNOSIS — Z886 Allergy status to analgesic agent status: Secondary | ICD-10-CM

## 2012-02-11 DIAGNOSIS — J45909 Unspecified asthma, uncomplicated: Secondary | ICD-10-CM | POA: Diagnosis present

## 2012-02-11 DIAGNOSIS — I872 Venous insufficiency (chronic) (peripheral): Secondary | ICD-10-CM | POA: Diagnosis present

## 2012-02-11 DIAGNOSIS — R141 Gas pain: Secondary | ICD-10-CM | POA: Diagnosis present

## 2012-02-11 DIAGNOSIS — E669 Obesity, unspecified: Secondary | ICD-10-CM | POA: Diagnosis present

## 2012-02-11 DIAGNOSIS — F3289 Other specified depressive episodes: Secondary | ICD-10-CM | POA: Diagnosis present

## 2012-02-11 DIAGNOSIS — L299 Pruritus, unspecified: Secondary | ICD-10-CM | POA: Diagnosis present

## 2012-02-11 DIAGNOSIS — L97909 Non-pressure chronic ulcer of unspecified part of unspecified lower leg with unspecified severity: Secondary | ICD-10-CM | POA: Diagnosis present

## 2012-02-11 DIAGNOSIS — Z882 Allergy status to sulfonamides status: Secondary | ICD-10-CM

## 2012-02-11 DIAGNOSIS — J309 Allergic rhinitis, unspecified: Secondary | ICD-10-CM

## 2012-02-11 DIAGNOSIS — R142 Eructation: Secondary | ICD-10-CM | POA: Diagnosis present

## 2012-02-11 DIAGNOSIS — F329 Major depressive disorder, single episode, unspecified: Secondary | ICD-10-CM | POA: Diagnosis present

## 2012-02-11 DIAGNOSIS — E785 Hyperlipidemia, unspecified: Secondary | ICD-10-CM

## 2012-02-11 DIAGNOSIS — F79 Unspecified intellectual disabilities: Secondary | ICD-10-CM | POA: Diagnosis present

## 2012-02-11 DIAGNOSIS — Y849 Medical procedure, unspecified as the cause of abnormal reaction of the patient, or of later complication, without mention of misadventure at the time of the procedure: Secondary | ICD-10-CM | POA: Diagnosis not present

## 2012-02-11 DIAGNOSIS — R0602 Shortness of breath: Secondary | ICD-10-CM

## 2012-02-11 LAB — COMPREHENSIVE METABOLIC PANEL
ALT: 20 U/L (ref 0–35)
Alkaline Phosphatase: 86 U/L (ref 39–117)
CO2: 23 mEq/L (ref 19–32)
Calcium: 8.8 mg/dL (ref 8.4–10.5)
Chloride: 104 mEq/L (ref 96–112)
GFR calc Af Amer: >90 mL/min (ref >90)
GFR calc non Af Amer: >90 mL/min (ref >90)
Glucose, Bld: 92 mg/dL (ref 70–99)
Potassium: 3.8 mEq/L (ref 3.5–5.1)
Sodium: 139 mEq/L (ref 135–145)
Total Bilirubin: 1 mg/dL (ref 0.3–1.2)

## 2012-02-11 LAB — CBC WITH DIFFERENTIAL/PLATELET
Eosinophils Relative: 0 % (ref 0–5)
Lymphocytes Relative: 24 % (ref 12–46)
Lymphs Abs: 1.7 10*3/uL (ref 0.7–4.0)
MCV: 79.1 fL (ref 78.0–100.0)
Platelets: 305 10*3/uL (ref 150–400)
RBC: 5.26 MIL/uL — ABNORMAL HIGH (ref 3.87–5.11)
WBC: 7.2 10*3/uL (ref 4.0–10.5)

## 2012-02-11 MED ORDER — BIOTENE DRY MOUTH MT LIQD
15.0000 mL | Freq: Two times a day (BID) | OROMUCOSAL | Status: DC
  Administered 2012-02-11 – 2012-02-15 (×8): 15 mL via OROMUCOSAL

## 2012-02-11 MED ORDER — SODIUM CHLORIDE 0.9 % IJ SOLN
3.0000 mL | Freq: Two times a day (BID) | INTRAMUSCULAR | Status: DC
  Administered 2012-02-12: 3 mL via INTRAVENOUS

## 2012-02-11 MED ORDER — LISINOPRIL 10 MG PO TABS
10.0000 mg | ORAL_TABLET | Freq: Every day | ORAL | Status: DC
  Administered 2012-02-12 – 2012-02-14 (×3): 10 mg via ORAL
  Filled 2012-02-11 (×4): qty 1

## 2012-02-11 MED ORDER — PANTOPRAZOLE SODIUM 20 MG PO TBEC
20.0000 mg | DELAYED_RELEASE_TABLET | Freq: Two times a day (BID) | ORAL | Status: DC
  Administered 2012-02-12 – 2012-02-15 (×7): 20 mg via ORAL
  Filled 2012-02-11 (×11): qty 1

## 2012-02-11 MED ORDER — CYANOCOBALAMIN 250 MCG PO TABS
250.0000 ug | ORAL_TABLET | Freq: Every day | ORAL | Status: DC
  Administered 2012-02-12 – 2012-02-15 (×4): 250 ug via ORAL
  Filled 2012-02-11 (×5): qty 1

## 2012-02-11 MED ORDER — ENOXAPARIN SODIUM 40 MG/0.4ML ~~LOC~~ SOLN
40.0000 mg | SUBCUTANEOUS | Status: DC
  Administered 2012-02-12 – 2012-02-15 (×4): 40 mg via SUBCUTANEOUS
  Filled 2012-02-11 (×4): qty 0.4

## 2012-02-11 MED ORDER — ONDANSETRON HCL 4 MG/2ML IJ SOLN: 4.0000 mg | Freq: Four times a day (QID) | INTRAMUSCULAR | Status: DC | PRN

## 2012-02-11 MED ORDER — IPRATROPIUM BROMIDE 0.02 % IN SOLN
0.5000 mg | Freq: Once | RESPIRATORY_TRACT | Status: AC
  Administered 2012-02-11: 0.5 mg via RESPIRATORY_TRACT
  Filled 2012-02-11: qty 2.5

## 2012-02-11 MED ORDER — ALBUTEROL SULFATE (5 MG/ML) 0.5% IN NEBU
2.5000 mg | INHALATION_SOLUTION | Freq: Once | RESPIRATORY_TRACT | Status: AC
  Administered 2012-02-11: 2.5 mg via RESPIRATORY_TRACT
  Filled 2012-02-11: qty 1

## 2012-02-11 MED ORDER — SODIUM CHLORIDE 0.9 % IV SOLN: 250.0000 mL | INTRAVENOUS | Status: DC | PRN

## 2012-02-11 MED ORDER — FUROSEMIDE 10 MG/ML IJ SOLN
40.0000 mg | Freq: Once | INTRAMUSCULAR | Status: AC
  Administered 2012-02-11: 40 mg via INTRAVENOUS
  Filled 2012-02-11: qty 4

## 2012-02-11 MED ORDER — LORATADINE 10 MG PO TABS
10.0000 mg | ORAL_TABLET | Freq: Every day | ORAL | Status: DC | PRN
  Filled 2012-02-11: qty 1

## 2012-02-11 MED ORDER — SODIUM CHLORIDE 0.9 % IJ SOLN
3.0000 mL | Freq: Two times a day (BID) | INTRAMUSCULAR | Status: DC
  Administered 2012-02-12 (×2): 3 mL via INTRAVENOUS

## 2012-02-11 MED ORDER — ALBUTEROL SULFATE HFA 108 (90 BASE) MCG/ACT IN AERS: 2.0000 | INHALATION_SPRAY | Freq: Four times a day (QID) | RESPIRATORY_TRACT | Status: DC | PRN

## 2012-02-11 MED ORDER — FUROSEMIDE 10 MG/ML IJ SOLN
40.0000 mg | Freq: Every day | INTRAMUSCULAR | Status: DC
  Administered 2012-02-12 – 2012-02-13 (×3): 40 mg via INTRAVENOUS
  Filled 2012-02-11 (×3): qty 4

## 2012-02-11 MED ORDER — FLUTICASONE PROPIONATE 50 MCG/ACT NA SUSP
2.0000 | Freq: Every day | NASAL | Status: DC
  Administered 2012-02-12 – 2012-02-15 (×4): 2 via NASAL
  Filled 2012-02-11: qty 16

## 2012-02-11 MED ORDER — SIMVASTATIN 10 MG PO TABS
10.0000 mg | ORAL_TABLET | Freq: Every day | ORAL | Status: DC
  Administered 2012-02-12 – 2012-02-14 (×3): 10 mg via ORAL
  Filled 2012-02-11 (×4): qty 1

## 2012-02-11 MED ORDER — SODIUM CHLORIDE 0.9 % IJ SOLN: 3.0000 mL | INTRAMUSCULAR | Status: DC | PRN

## 2012-02-11 MED ORDER — ENOXAPARIN SODIUM 40 MG/0.4ML ~~LOC~~ SOLN
40.0000 mg | SUBCUTANEOUS | Status: DC
  Filled 2012-02-11 (×2): qty 0.4

## 2012-02-11 MED ORDER — NYSTATIN 100000 UNIT/GM EX POWD: 1.0000 g | Freq: Three times a day (TID) | CUTANEOUS | Status: DC

## 2012-02-11 MED ORDER — ACETAMINOPHEN 325 MG PO TABS
650.0000 mg | ORAL_TABLET | ORAL | Status: DC | PRN
  Administered 2012-02-13 – 2012-02-14 (×4): 650 mg via ORAL
  Filled 2012-02-11 (×4): qty 2

## 2012-02-11 MED ORDER — NYSTATIN 100000 UNIT/GM EX POWD
Freq: Three times a day (TID) | CUTANEOUS | Status: DC
  Administered 2012-02-12 – 2012-02-15 (×9): via TOPICAL
  Filled 2012-02-11: qty 15

## 2012-02-11 MED ORDER — SODIUM CHLORIDE 0.9 % IV SOLN: Freq: Once | INTRAVENOUS | Status: DC

## 2012-02-11 NOTE — H&P (Signed)
Hospital Admission Note Date: 02/11/2012  Patient name: [name] Medical record number: [id number] Date of birth: [birth year] Age: 52 y.o. Gender: female PCP: Tonia Brooms, MD  Medical Service: [facility]  Attending physician: Dr. Eppie Gibson   1st Contact: Dr. Eulas Post   [phone] 2nd Contact: Dr. Owens Shark   HKFEX:[phone] After 5 pm or weekends: 1st Contact:  Intern on call   Pager: [phone] 2nd Contact:  Resident on call  Pager: [phone]  Chief Complaint: dyspnea  History of Present Illness: [name] is a 52 y.o w PMH severe OSA on cpap, DM 2, venous stasis ulcers, GERD, and anemia with chief complaint of dyspnea. Faith. Barker is noticed increasing shortness of breath over the past couple of weeks, mainly with exertion. She awoke this morning and noticed acute worsening of her shortness of breath and reported that she had trouble walking to the bathroom from her bedroom. She reports occasional cough, sometimes productive of clear sputum. She has also noticed some wheezing over the past few days. She does take Lasix due to her chronic venous stasis disease, but missed her dose today. She denies any chest pain or pressure. She has had one episode of nausea and vomiting a couple days ago. She has no abdominal pain and denies diarrhea. She also endorses increasing distention of her stomach. Last bowel movement was night prior to admission and patient reports is normal. Patient was treated about a month ago for reported vaginal cellulitis in the [facility] long emergency room. She followed up in clinic about 2 weeks ago and was noted to have persistent pinkish erythema of her suprapubic panus. She says the swelling is still present, but does not have much pain today. She completed a repeat course of Keflex about 2 weeks ago. She's also had months of persistent pruritic lesions covering her abdomen arms and legs. She's been treated several times for scabies, but has not had skin scraping for microscopic  confirmation. She says these areas are unchanged today.  Meds: No current outpatient prescriptions on file.  Allergies: Allergies as of 02/11/2012 - Review Complete 02/11/2012  Allergen Reaction Noted  . Aspirin  12/09/2005  . Codeine    . Sulfonamide derivatives  10/19/2005   Past Medical History  Diagnosis Date  . OSA (obstructive sleep apnea)     CPAP  . Venous stasis ulcer     chornic, ?followed up at wound care center, multiple courses of antibiotics in past for cellulitis, on lasix  . GERD (gastroesophageal reflux disease)   . Anemia, iron deficiency     secondary to menhorrhagia, on oral iron, also b12 def, getting monthly b12 shots  . Chronic cough     secondary to alleriges and post nasal drip  . Depression   . Diabetes mellitus   . Hyperlipidemia   . H/O mental retardation   . Herpes    Past Surgical History  Procedure Date  . Cholecystectomy    Family History  Problem Relation Age of Onset  . Mental illness Sister   . Mental retardation Brother    History   Social History  . Marital Status: Single    Spouse Name: N/A    Number of Children: N/A  . Years of Education: N/A   Occupational History  . Not on file.   Social History Main Topics  . Smoking status: Never Smoker   . Smokeless tobacco: Never Used  . Alcohol Use: No  . Drug Use: No  . Sexually Active: No  Other Topics Concern  . Not on file   Social History Narrative  . No narrative on file    Review of Systems: 10 pt ROS performed, pertinent positives and negatives noted in HPI  Physical Exam: Blood pressure 135/64, pulse 95, temperature 97.3 F (36.3 C), temperature source Oral, resp. rate 20, height _0  (1.6 m), weight 298 lb 15.1 oz (135.6 kg), SpO2 100.00%. Constitutional: Vital signs reviewed. Patient is an obese female in no acute distress and cooperative with exam. Alert and oriented x3.  Head: Normocephalic and atraumatic  Mouth: no erythema or exudates, MMM  Eyes:  PERRL, EOMI, conjunctivae normal, No scleral icterus.  Neck: Supple, Trachea midline normal ROM, No JVD appreciated although difficult to discern with patient's large habitus  Cardiovascular: RRR, S1 normal, S2 normal, no MRG, 2+ radial pulses  Pulmonary/Chest: Good air movement, decreased BS in bilateral bases, no crackles appreciated. No wheezes on my exam.   Abdominal: Obese and distended. Tympanic percussion. No tenderness to palpation.  GU: no CVA tenderness Neurological: A&O x3, Strength is normal and symmetric bilaterally, cranial nerve II-XII are grossly intact, no focal motor deficit, sensory intact to light touch bilaterally.  Skin: Small, 5-10 mm excoriated lesions covering bilateral forearms, abdomen, and upper thighs. No purulent drainage, erythema, or warmth to suggest superinfection. No lesions noted finger webspaces. Bilateral lower extremities wrapped tightly by wound care for patient's venous stasis disease.  Psychiatric: Speech is slow and loud, patient has mild cognitive impairment which is chronic.  Lab results: Basic Metabolic Panel:  Basename 02/11/12 1519  NA 139  K 3.8  CL 104  CO2 23  GLUCOSE 92  BUN 19  CREATININE 0.79  CALCIUM 8.8  MG --  PHOS --   Liver Function Tests:  Basename 02/11/12 1519  AST 30  ALT 20  ALKPHOS 86  BILITOT 1.0  PROT 6.7  ALBUMIN 3.3*   CBC:  Basename 02/11/12 1519  WBC 7.2  NEUTROABS 4.2  HGB 13.0  HCT 41.6  MCV 79.1  PLT 305   BNP:  Basename 02/11/12 1519  PROBNP 3892.0*   CBG:  Basename 02/11/12 2157  GLUCAP 72  Imaging results:  Dg Chest Port 1 View  02/11/2012  *RADIOLOGY REPORT*  Clinical Data: Shortness of breath.  PORTABLE CHEST - 1 VIEW  Comparison: March 20, 2008.  Findings: Stable cardiomegaly.  Lung bases are incompletely visualized and therefore small effusions cannot be excluded.  Bony thorax is intact.  No acute pulmonary disease is noted.  IMPRESSION: Lung bases not completely visualized and  therefore small bilateral pleural effusions cannot be excluded.  No definite evidence of acute pulmonary disease is seen.  Stable cardiomegaly.   Original Report Authenticated By: Marijo Conception.,  M.D.     Other results: EKG: Sinus rhythm w HR 91, 1st degree AV block, RAD, prolonged QTc (487) some nonspecific T-wave flattening/subtle inversions in inferior and anterolateral leads.   Assessment & Plan by Problem:  1) Dyspnea 2/2 CHF exacerbation Patient presents with worsening dyspnea and LE edema. CXR shows pleural effusions with no infiltrate. No leukocytosis or significant metabolic derangement. BNP >3800 from baseline <30. Last echo 2010 w EF 65-70% but abnormal relaxation. Initial troponin negative in ED. Does not have symptoms or clinical evidence of ACS at this time. She is oxygenating well with 96% on room air in ED and now 100% on nasal cannula. [facility] UOP of 500cc.Creatinine on presentation was 0.8.  She has a history of obstructive sleep apnea and EKG suggests right axis deviation right ventricular hypertrophy. Right-sided heart failure may be cause of her symptoms. Also noted some wheezing and has a history of prior restrictive lung disease on PFTs from 2010. Uses albuterol and ipratropium nebs at home. - Telemetry bed - Lasix 40 mg IV BID, follow Cr and titrate diuresis to renal fuction. - repeat echo in am - cycle CE - started ACE-i, lisinopril 74m  - Breathing treatments w abluterol/ipratropium nebs  2) Abdominal distention Patient has worsening distention of abdomen. Does not have a fluid wave on exam, and abdomen does not appear edematous. Very tympanic on percussion. Reassuringly, she is having normal bowel movements and is not tender to palpation. Does have history of prior abdominal surgery was cholecystectomy. Her presentation is not consistent with obstruction at this time.  - Abdominal x-ray in the morning  3) Diffuse trunk and  extremity pruritic papules The appearance of these lesions does look like mite infection . Has been prescribed permethrin several times in the past, but has noted confusion with administration. May also have continuous exposure at home. No microscopic confirmation. Other considerations for differential diagnosis includes pediculosis corporis (body lice) which may appear clinically very similar and may be more likely since patient is experiencing multiple recurrences. Presentation is less consistent with fungal skin infections, contact dermatitis, atopic dermatitis, or psoriasis. No urticaria or vesicles Will treat empirically with permethrin cream in the hospital when he can be applied the supervision. This should cover both scabies and lice, however would not ultimately help long-term if patient with continued exposure at home. When he education prior to discharge on home hygiene measures. May consider skin biopsies outpatient lesions do not resolve. - Recommend permethrin to entire body x1 before discharge. Will hold off now until primary team has evaluated patient.  4) OSA Patient has some evidence of right axis deviation and RVH on her EKG. Her heart failure symptoms may be related to right-sided heart failure secondary to her severe obstructive sleep apnea. She does have prior PFTs in 2010 which suggested moderate restrictive pattern. - Continue CPAP while inpatient - Per #1, echo - Likely will need repeat PFTs as outpatient  5) Venous stasis ulcers  Care for by wound care as outpatient. Last saw them today. May need to call wound care if she is here for a long time.  6) T2DM Well controlled on metformin therapy w last HbA1c 6.2 in November.  - Hold therapy, last CBG 72   Dispo: Disposition is deferred at this time, awaiting improvement of current medical problems. Anticipated discharge in approximately 3 day(s).   The patient does have a current PCP (Sandy Salaam CHoyle Sauer MD), therefore will  be requiring OPC follow-up after discharge.   The patient does not have transportation limitations that hinder transportation to clinic appointments.  Signed: ZTonia Brooms2/07/2012, 11:35 PM

## 2012-02-11 NOTE — ED Provider Notes (Signed)
History     CSN: [id number]  Arrival date & time 02/11/12  1420   First MD Initiated Contact with Patient 02/11/12 1507      Chief Complaint  Patient presents with  . Shortness of Breath    (Consider location/radiation/quality/duration/timing/severity/associated sxs/prior treatment) HPI Comments: 52 y.o PMH severe OSA on cpap, DM 2.  She presents for sob worsening gradually over weeks noted with exertion since 9 am this morning and noted to be worsening.  She is having trouble walking to the bathroom without sob with exertion. She did not try use of her inhaler.  She did not take her Lasix today due to no oral intake.  She denies sick contacts, fever.  She has had cough with clear sputum, vomiting x 2 days ago, nausea this am, increased size of abdomen.  She denies chest pain.  She reports wheezing.  She denies diarrhea but reports constipation.     Patient is a 52 y.o. female presenting with shortness of breath. The history is provided by the patient. No language interpreter was used.  Shortness of Breath  The current episode started today. The problem has been unchanged. The symptoms are relieved by rest. The symptoms are aggravated by activity. Associated symptoms include cough, shortness of breath and wheezing. Pertinent negatives include no chest pain, no fever and no sore throat. Cough characteristics: min with clear sputum. Urine output has decreased. There were no sick contacts.    Past Medical History  Diagnosis Date  . OSA (obstructive sleep apnea)     CPAP  . Venous stasis ulcer     chornic, ?followed up at wound care center, multiple courses of antibiotics in past for cellulitis, on lasix  . GERD (gastroesophageal reflux disease)   . Anemia, iron deficiency     secondary to menhorrhagia, on oral iron, also b12 def, getting monthly b12 shots  . Chronic cough     secondary to alleriges and post nasal drip  . Depression   . Diabetes mellitus   . Hyperlipidemia   . H/O  mental retardation   . Herpes     Past Surgical History  Procedure Date  . Cholecystectomy     Family History  Problem Relation Age of Onset  . Mental illness Sister   . Mental retardation Brother     History  Substance Use Topics  . Smoking status: Never Smoker   . Smokeless tobacco: Never Used  . Alcohol Use: No    OB History    Grav Para Term Preterm Abortions TAB SAB Ect Mult Living   _0 Review of Systems  Constitutional: Negative for fever.       Denies sick contacts  HENT: Negative for sore throat.   Respiratory: Positive for cough, shortness of breath and wheezing.        Cough with sputum clear   Cardiovascular: Positive for leg swelling. Negative for chest pain.  Gastrointestinal: Positive for nausea, constipation and abdominal distention. Negative for vomiting and diarrhea.       Nausea this am, vomiting 2 days ago  Genitourinary:       Decreased freq of urination  All other systems reviewed and are negative.    Allergies  Aspirin; Codeine; and Sulfonamide derivatives  Home Medications   Current Outpatient Rx  Name  Route  Sig  Dispense  Refill  . ALBUTEROL SULFATE HFA 108 (90 BASE) MCG/ACT IN AERS  Inhalation   Inhale 2 puffs into the lungs every 6 (six) hours as needed for wheezing.   1 Inhaler   2   . FLUTICASONE PROPIONATE 50 MCG/ACT NA SUSP   Nasal   Place 2 sprays into the nose daily.   16 g   3   . FUROSEMIDE 20 MG PO TABS   Oral   Take 1 tablet (20 mg total) by mouth daily.   90 tablet   4   . IBUPROFEN 800 MG PO TABS   Oral   Take 800 mg by mouth every 8 (eight) hours as needed. pain         . LORATADINE 10 MG PO TABS   Oral   Take 1 tablet (10 mg total) by mouth daily as needed for allergies.   30 tablet   2   . METFORMIN HCL 1000 MG PO TABS   Oral   Take 1,000 mg by mouth 2 (two) times daily with a meal.         . NYSTATIN 100000 UNIT/GM EX POWD   Topical   Apply 1 g topically 3 (three) times  daily. Apply to skin folds around stomach and groin area 3 times daily         . PANTOPRAZOLE SODIUM 20 MG PO TBEC   Oral   Take 1 tablet (20 mg total) by mouth 2 (two) times daily.   180 tablet   4   . PRAVASTATIN SODIUM 20 MG PO TABS   Oral   Take 20 mg by mouth every evening.         Marland Kitchen VITAMIN B-12 250 MCG PO TABS   Oral   Take 250 mcg by mouth daily.           BP 129/76  Pulse 85  Temp 97.9 F (36.6 C) (Oral)  Resp 18  SpO2 96%  Physical Exam  Nursing note and vitals reviewed. Constitutional: She is oriented to person, place, and time. Vital signs are normal. She appears well-developed and well-nourished. She is cooperative. No distress.  HENT:  Head: Normocephalic and atraumatic.  Mouth/Throat: Oropharynx is clear and moist and mucous membranes are normal. No oropharyngeal exudate.  Eyes: Conjunctivae normal are normal. Pupils are equal, round, and reactive to light. Right eye exhibits no discharge. Left eye exhibits no discharge. No scleral icterus.  Cardiovascular: Normal rate, regular rhythm, S1 normal, S2 normal and intact distal pulses.  Exam reveals no gallop and no friction rub.   No murmur heard.      2+ lower ext edema  Pulmonary/Chest: Effort normal. No respiratory distress. She has wheezes.       Min. Wheezes b/l  Abdominal: Bowel sounds are normal. There is no tenderness.       Obese ab Firm, no ttp  Neurological: She is alert and oriented to person, place, and time.  Skin: She is not diaphoretic.       Multiple lesions to abdomen and lower extremities papules and macules erythematous   Psychiatric: She has a normal mood and affect. Her speech is normal and behavior is normal.       MR    ED Course  Procedures (including critical care time)  Labs Reviewed  CBC WITH DIFFERENTIAL - Abnormal; Notable for the following:    RBC 5.26 (*)     MCH 24.7 (*)     RDW 18.1 (*)     Monocytes Relative 17 (*)     Monocytes  Absolute 1.2 (*)     All  other components within normal limits  COMPREHENSIVE METABOLIC PANEL - Abnormal; Notable for the following:    Albumin 3.3 (*)     All other components within normal limits  PRO B NATRIURETIC PEPTIDE - Abnormal; Notable for the following:    Pro B Natriuretic peptide (BNP) 3892.0 (*)     All other components within normal limits  POCT I-STAT TROPONIN I   Dg Chest Port 1 View  02/11/2012  *RADIOLOGY REPORT*  Clinical Data: Shortness of breath.  PORTABLE CHEST - 1 VIEW  Comparison: March 20, 2008.  Findings: Stable cardiomegaly.  Lung bases are incompletely visualized and therefore small effusions cannot be excluded.  Bony thorax is intact.  No acute pulmonary disease is noted.  IMPRESSION: Lung bases not completely visualized and therefore small bilateral pleural effusions cannot be excluded.  No definite evidence of acute pulmonary disease is seen.  Stable cardiomegaly.   Original Report Authenticated By: Marijo Conception.,  M.D.     Date: 02/11/2012  Rate: 91  Rhythm: normal sinus rhythm  QRS Axis: left  Intervals: normal  ST/T Wave abnormalities: normal  Conduction Disutrbances:none  Narrative Interpretation:   Old EKG Reviewed: none available    1. Shortness of breath       MDM   1. SOB ddx acute diastolic heart failure exacerbation, less likely bronchitis, pneumonia CXR with ?pleural effusions; BNP >3800 baseline <30 pfts 2010 suggest moderate restriction On 2L Grace sat 95-100%, RR 17-20s Rx Atrovent, Albuterol neb x 1, Lasix 40 mg x 1 CMET, cbc, CXR, trop x 1, EKG Will need admission for possible diastolic heart failure exacerbation, will need echo ordered and further w/u i.e (diuresis, etc)   Aundra Dubin MD Taft McLean, MD 02/11/12 Vernelle Emerald

## 2012-02-11 NOTE — ED Notes (Signed)
Pt complains of shortness of breath x 1 day with cough

## 2012-02-11 NOTE — ED Notes (Signed)
Patient states that upper stomach has been swelling within the past week.Lower belly had also been swollen but swelling had been decreasing there.

## 2012-02-11 NOTE — ED Notes (Signed)
XR at bedside

## 2012-02-11 NOTE — ED Provider Notes (Signed)
I saw and evaluated the patient, reviewed the resident's note and I agree with the findings and plan. I have reviewed EKG and agree with the resident interpretation.  you Patient with history of worsening shortness of breath concerning for CHF exacerbation. She has diffuse lower extremity edema and is complaining of some fullness in her abdomen. She is to keep adequate urinary and small activity. BMP is consistent with CHF with a level of greater than 3000 and prior levels being less than 30. She was given IV Lasix and admitted for CHF exacerbation  Blanchie Dessert, MD 02/11/12 2359

## 2012-02-12 ENCOUNTER — Inpatient Hospital Stay (HOSPITAL_COMMUNITY): Payer: Medicaid Other

## 2012-02-12 DIAGNOSIS — I509 Heart failure, unspecified: Secondary | ICD-10-CM | POA: Diagnosis present

## 2012-02-12 DIAGNOSIS — B86 Scabies: Secondary | ICD-10-CM

## 2012-02-12 DIAGNOSIS — R0602 Shortness of breath: Secondary | ICD-10-CM

## 2012-02-12 LAB — CBC
MCH: 25.3 pg — ABNORMAL LOW (ref 26.0–34.0)
MCH: 25.7 pg — ABNORMAL LOW (ref 26.0–34.0)
MCHC: 32.5 g/dL (ref 30.0–36.0)
MCV: 79 fL (ref 78.0–100.0)
MCV: 79.1 fL (ref 78.0–100.0)
Platelets: 289 10*3/uL (ref 150–400)
Platelets: 292 10*3/uL (ref 150–400)
RBC: 5.09 MIL/uL (ref 3.87–5.11)
RDW: 18.5 % — ABNORMAL HIGH (ref 11.5–15.5)

## 2012-02-12 LAB — BASIC METABOLIC PANEL
CO2: 28 mEq/L (ref 19–32)
Calcium: 9.3 mg/dL (ref 8.4–10.5)
Creatinine, Ser: 0.82 mg/dL (ref 0.50–1.10)
Glucose, Bld: 118 mg/dL — ABNORMAL HIGH (ref 70–99)
Sodium: 143 mEq/L (ref 135–145)

## 2012-02-12 LAB — CREATININE, SERUM: Creatinine, Ser: 0.8 mg/dL (ref 0.50–1.10)

## 2012-02-12 LAB — GLUCOSE, CAPILLARY

## 2012-02-12 MED ORDER — HYDROXYZINE HCL 25 MG PO TABS
25.0000 mg | ORAL_TABLET | Freq: Three times a day (TID) | ORAL | Status: DC | PRN
  Filled 2012-02-12: qty 1

## 2012-02-12 MED ORDER — POTASSIUM CHLORIDE CRYS ER 20 MEQ PO TBCR
40.0000 meq | EXTENDED_RELEASE_TABLET | ORAL | Status: AC
  Administered 2012-02-12 (×2): 40 meq via ORAL
  Filled 2012-02-12 (×2): qty 2

## 2012-02-12 NOTE — H&P (Signed)
Internal Medicine Attending Admission Note Date: 02/12/2012  Patient name: LAMAE FOSCO Medical record number: [id number] Date of birth: 03/26/1960 Age: 52 y.o. Gender: female  I saw and evaluated the patient. I reviewed the resident's note and I agree with the resident's findings and plan as documented in the resident's note.  Chief Complaint(s): Shortness of breath.  History - key components related to admission:  Ms. Milley is a 52 year old woman with a history of diabetes, severe obstructive sleep apnea requiring CPAP, chronic venous stasis ulcers, and diastolic dysfunction who presents with a several week history of progressive gradual worsening of shortness of breath and dyspnea on exertion. In the past she was able to walk down her street. This has gradually deteriorated to the point that she gets short of breath just walking down the stairs of her porch. She denies any fevers, shakes, chills, productive cough, chest pain, orthopnea, or PND. The symptoms progressed to such a point that yesterday she decided to come to the emergency department for further evaluation. Since admission she has received IV Lasix for presumed acute on chronic diastolic heart failure. She notes she is minimally improved with this therapy.  Physical Exam - key components related to admission:  Filed Vitals:   02/12/12 0020 02/12/12 0509 02/12/12 0620 02/12/12 1617  BP:   125/84 121/74  Pulse:   90 93  Temp:   97.7 F (36.5 C) 97.9 F (36.6 C)  TempSrc:   Axillary Oral  Resp: _0 Height:      Weight:  291 lb 12.8 oz (132.36 kg)    SpO2:   99% 99%   Gen.: Well-developed, well-nourished, woman sitting comfortably in a chair eating dinner in no acute distress. Her speech is slow and pressured. She is very tangential during the history. Lungs: Clear to auscultation bilaterally without wheezes, rhonchi, or rales. Heart: Regular rate and rhythm without murmurs, rubs, or gallops. Abdomen: Obese, soft,  nontender, active bowel sounds without masses. The lower pelvic area has indurated and woody skin suggestive of chronic edema. The abdomen above the suprapubic bone is without such skin changes. Extremities: Wrapped in Ace bandages. 2+ pitting edema to midshin bilaterally. Skin: Excoriated lesions covering the thighs, abdomen, and forearms.  Lab results:  Basic Metabolic Panel:  Recent Labs  02/11/12 1519 02/12/12 0002 02/12/12 0950  NA 139  --  143  K 3.8  --  3.0*  CL 104  --  103  CO2 23  --  28  GLUCOSE 92  --  118*  BUN 19  --  17  CREATININE 0.79 0.80 0.82  CALCIUM 8.8  --  9.3   Liver Function Tests:  Recent Labs  02/11/12 1519  AST 30  ALT 20  ALKPHOS 86  BILITOT 1.0  PROT 6.7  ALBUMIN 3.3*   CBC:  Recent Labs  02/11/12 1519 02/11/12 2359 02/12/12 0950  WBC 7.2 7.3 6.6  NEUTROABS 4.2  --   --   HGB 13.0 13.9 12.9  HCT 41.6 42.8 40.2  MCV 79.1 79.1 79.0  PLT 305 292 289   CBG:  Recent Labs  02/11/12 2157 02/12/12 0613 02/12/12 1124 02/12/12 1619  GLUCAP 72 90 123* 99   Misc. Labs:  BNP 3892  Imaging results:  Dg Chest 2 View  02/12/2012  *RADIOLOGY REPORT*  Clinical Data: Short of breath, cough, congestion  CHEST - 2 VIEW  Comparison: Portable chest x-ray of 02/11/2012  Findings: There are only small effusions  remaining right larger than left.  The lungs are not well aerated with basilar atelectasis present.  Cardiomegaly is stable.  IMPRESSION: Poor aeration with basilar atelectasis and small effusions.   Original Report Authenticated By: Ivar Drape, M.D.    Dg Abd 1 View  02/12/2012  *RADIOLOGY REPORT*  Clinical Data: Increasing abdominal distention  ABDOMEN - 1 VIEW  Comparison: None.  Findings: Supine views the abdomen show no significant gaseous distention.  No opaque calculi are noted.  No acute skeletal abnormality is seen.  IMPRESSION: Nonspecific bowel gas pattern.  No significant gaseous distention.   Original Report Authenticated By:  Ivar Drape, M.D.    Dg Chest Port 1 View  02/11/2012  *RADIOLOGY REPORT*  Clinical Data: Shortness of breath.  PORTABLE CHEST - 1 VIEW  Comparison: March 20, 2008.  Findings: Stable cardiomegaly.  Lung bases are incompletely visualized and therefore small effusions cannot be excluded.  Bony thorax is intact.  No acute pulmonary disease is noted.  IMPRESSION: Lung bases not completely visualized and therefore small bilateral pleural effusions cannot be excluded.  No definite evidence of acute pulmonary disease is seen.  Stable cardiomegaly.   Original Report Authenticated By: Marijo Conception.,  M.D.    Other results:  EKG: Normal sinus rhythm at 91 beats per minute, indeterminate axis, normal intervals, low voltage, no LVH or significant Q waves, nonspecific ST-T changes.  Assessment & Plan by Problem:  Ms. Smyth is a 52 year old woman with a history of diabetes, hypertension, and severe obstructive sleep apnea who presents with a several week history of progressive shortness of breath and dyspnea on exertion. The most likely cause of her symptoms are exacerbation of her diastolic heart failure. The cause of this exacerbation is unclear.  1) Presumed acute on chronic diastolic heart failure: We will continue with the IV Lasix diuresis. We're looking for an improvement in symptoms. We will closely follow the BUN and creatinine and pullback when they start to bump. We will also be sure that she remains compliant with her nasal CPAP at night. She was started on an ACE inhibitor for her heart failure. Finally we are waiting for the results of an echocardiogram to assure that she does not have concomitant systolic dysfunction at this time.  2) Possible scabies versus lice: I agree with the housestaff's plan to treat with permethrin while in the hospital to assure an appropriate application.

## 2012-02-12 NOTE — Progress Notes (Signed)
Patient arrived to 19.  Patient placed on telemetry and oriented to the unit.  VS taken and assessment completed.  Call bell placed with reach.

## 2012-02-12 NOTE — Plan of Care (Signed)
Problem: Phase I Progression Outcomes Goal: Dyspnea controlled at rest (HF) Outcome: Completed/Met Date Met:  02/12/12 Pt has not had any c/o SOB while at rest, pt oob to chair tolerating well, will monitor  Goal: Pain controlled with appropriate interventions Outcome: Completed/Met Date Met:  02/12/12 Pt has not had any c/p chest pain, goal met Goal: EF % per last Echo/documented,Core Reminder form on chart Outcome: Completed/Met Date Met:  02/12/12 Last EF 65-70% from 03/2008

## 2012-02-12 NOTE — Progress Notes (Signed)
Subjective: The patient was admitted overnight with SOB, thought to be a COPD exacerbation.  The patient notes that her SOB is only minimally improved this morning.    Objective: Vital signs in last 24 hours: Filed Vitals:   02/11/12 2133 02/12/12 0020 02/12/12 0509 02/12/12 0620  BP: 135/64   125/84  Pulse: 95   90  Temp: 97.3 F (36.3 C)   97.7 F (36.5 C)  TempSrc: Oral   Axillary  Resp: _0 Height: _1  (1.6 m)     Weight: 298 lb 15.1 oz (135.6 kg)  291 lb 12.8 oz (132.36 kg)   SpO2: 100%   99%   Weight change:   Intake/Output Summary (Last 24 hours) at 02/12/12 1227 Last data filed at 02/12/12 1225  Gross per 24 hour  Intake    243 ml  Output   5150 ml  Net  -4907 ml   PEX General: alert, cooperative, lying in bed HEENT: pupils equal round and reactive to light, vision grossly intact, oropharynx clear and non-erythematous  Neck: supple, no lymphadenopathy Lungs: course ronchi in bilateral lower lung fields Heart: regular rate and rhythm, no murmurs, gallops, or rubs Abdomen: soft, non-tender, non-distended, mild erythematous excoriations on bilateral lower abdomen and suprapubic area (improved from OV 1/15) Extremities: 2+ bilateral pitting edema Neurologic: alert & oriented X3, cranial nerves II-XII intact, strength grossly intact, sensation intact to light touch  Lab Results: Basic Metabolic Panel:  Recent Labs Lab 02/11/12 1519 02/12/12 0002 02/12/12 0950  NA 139  --  143  K 3.8  --  3.0*  CL 104  --  103  CO2 23  --  28  GLUCOSE 92  --  118*  BUN 19  --  17  CREATININE 0.79 0.80 0.82  CALCIUM 8.8  --  9.3   Liver Function Tests:  Recent Labs Lab 02/11/12 1519  AST 30  ALT 20  ALKPHOS 86  BILITOT 1.0  PROT 6.7  ALBUMIN 3.3*   CBC:  Recent Labs Lab 02/11/12 1519 02/11/12 2359 02/12/12 0950  WBC 7.2 7.3 6.6  NEUTROABS 4.2  --   --   HGB 13.0 13.9 12.9  HCT 41.6 42.8 40.2  MCV 79.1 79.1 79.0  PLT 305 292 289   BNP:  Recent  Labs Lab 02/11/12 1519  PROBNP 3892.0*   CBG:  Recent Labs Lab 02/11/12 2157 02/12/12 0613 02/12/12 1124  GLUCAP 72 90 123*   Studies/Results: Dg Chest 2 View  02/12/2012  *RADIOLOGY REPORT*  Clinical Data: Short of breath, cough, congestion  CHEST - 2 VIEW  Comparison: Portable chest x-ray of 02/11/2012  Findings: There are only small effusions remaining right larger than left.  The lungs are not well aerated with basilar atelectasis present.  Cardiomegaly is stable.  IMPRESSION: Poor aeration with basilar atelectasis and small effusions.   Original Report Authenticated By: Ivar Drape, M.D.    Dg Abd 1 View  02/12/2012  *RADIOLOGY REPORT*  Clinical Data: Increasing abdominal distention  ABDOMEN - 1 VIEW  Comparison: None.  Findings: Supine views the abdomen show no significant gaseous distention.  No opaque calculi are noted.  No acute skeletal abnormality is seen.  IMPRESSION: Nonspecific bowel gas pattern.  No significant gaseous distention.   Original Report Authenticated By: Ivar Drape, M.D.    Dg Chest Port 1 View  02/11/2012  *RADIOLOGY REPORT*  Clinical Data: Shortness of breath.  PORTABLE CHEST - 1 VIEW  Comparison: March 20, 2008.  Findings: Stable cardiomegaly.  Lung bases are incompletely visualized and therefore small effusions cannot be excluded.  Bony thorax is intact.  No acute pulmonary disease is noted.  IMPRESSION: Lung bases not completely visualized and therefore small bilateral pleural effusions cannot be excluded.  No definite evidence of acute pulmonary disease is seen.  Stable cardiomegaly.   Original Report Authenticated By: Marijo Conception.,  M.D.    Medications: I have reviewed the patient's current medications. Scheduled Meds: . antiseptic oral rinse  15 mL Mouth Rinse BID  . enoxaparin (LOVENOX) injection  40 mg Subcutaneous Q24H  . fluticasone  2 spray Each Nare Daily  . furosemide  40 mg Intravenous Daily  . lisinopril  10 mg Oral Daily  . nystatin   Topical  TID  . pantoprazole  20 mg Oral BID WC  . simvastatin  10 mg Oral q1800  . sodium chloride  3 mL Intravenous Q12H  . sodium chloride  3 mL Intravenous Q12H  . sodium chloride  3 mL Intravenous Q12H  . vitamin B-12  250 mcg Oral Daily   Continuous Infusions:  PRN Meds:.sodium chloride, sodium chloride, acetaminophen, albuterol, loratadine, ondansetron (ZOFRAN) IV, sodium chloride, sodium chloride Assessment/Plan: The patient is a 52 yo W, history of CHF, presenting with worsening SOB, c/w CHF exacerbation.  # SOB/CHF exacerbation - patient presented with elevated pro-BNP (3892), LE edema, and increasing SOB on exertion, concerning for acute CHF exacerbation, though last echo (03/2008) showed only EF 65-70% though with abnormal LV relaxation.  Differential also includes untreated OSA vs OHS.  No evidence of anemia vs COPD (never smoker).  PE unlikely, though modified geneva on admission = 5 due to tachycardia. -check echo for CHF -continue lasix 40 daily -monitor daily bmet for creatinine to watch for overdiuresis -CPAP qhs -continue nebs prn for symptomatic relief -if symptoms don't resolve, reconsider further work-up for PE  # Abdominal pruritis - the patient notes a history of pruritic papules on lower abdomen.  Examination of lower abdomen reveals several erythematous papules and excoriations.  The patient has a history of scabies, with a question of whether patient was able to fully treat herself at home.  However, upon examining abdomen, I don't see any characteristic scabies "burrows".  Differential includes incompletely treated scabies vs residual epidermal irritation from recent scabies infection vs intertrigo.   -hydroxyzine prn for itching -nystatin powder prn for intertrigo -consider repeating permethrin treatment once during her hospitalization to ensure complete scabies treatment  # OSA - history of severe OSA -continue CPAP  # Venous stasis ulcers - followed by outpatient  wound care -daily dressing changes  # DM2 - last A1C = 6.2 -monitor CBG's, can start SSI if needed  Dispo: Disposition is deferred at this time, awaiting improvement of current medical problems.  Anticipated discharge in approximately 2-3 day(s).   The patient does have a current PCP Sandy Salaam, Hoyle Sauer, MD), therefore will be requiring OPC follow-up after discharge.  Marland Kitchen  .Services Needed at time of discharge: Y = Yes, Blank = No PT:   OT:   RN:   Equipment:   Other:     LOS: 1 day   Elnora Morrison 02/12/2012, 12:27 PM

## 2012-02-13 LAB — BASIC METABOLIC PANEL
Calcium: 9 mg/dL (ref 8.4–10.5)
Creatinine, Ser: 0.87 mg/dL (ref 0.50–1.10)
GFR calc Af Amer: 89 mL/min — ABNORMAL LOW (ref >90)
GFR calc non Af Amer: 76 mL/min — ABNORMAL LOW (ref >90)
Sodium: 141 mEq/L (ref 135–145)

## 2012-02-13 MED ORDER — PERMETHRIN 5 % EX CREA
TOPICAL_CREAM | Freq: Once | CUTANEOUS | Status: AC
  Administered 2012-02-13: 11:00:00 via TOPICAL
  Filled 2012-02-13: qty 60

## 2012-02-13 MED ORDER — FUROSEMIDE 10 MG/ML IJ SOLN
80.0000 mg | Freq: Every day | INTRAMUSCULAR | Status: DC
  Administered 2012-02-14: 80 mg via INTRAVENOUS
  Filled 2012-02-13 (×2): qty 8

## 2012-02-13 NOTE — Plan of Care (Signed)
Problem: Phase I Progression Outcomes Goal: Up in chair, BRP Outcome: Completed/Met Date Met:  02/13/12 Pt oob to chair with supervision, tolerates well, no c/o SOB goal met Goal: Other Phase I Outcomes/Goals Outcome: Completed/Met Date Met:  02/13/12 Permethrin cream ordered and applied to whole body, dressings removed to bilat legs and cream applied, wounds redressed with mepilex and kerlix, pt is not to wash cream off for 24 hours, pt aware, will continue to monitor

## 2012-02-13 NOTE — Progress Notes (Signed)
Subjective: She states that her breathing has improved, and that she has "lost some weight" since admission.  Objective: Vital signs in last 24 hours: Filed Vitals:   02/12/12 0620 02/12/12 1617 02/12/12 2052 02/13/12 0440  BP: 125/84 121/74 116/75 101/64  Pulse: 90 93 88 100  Temp: 97.7 F (36.5 C) 97.9 F (36.6 C) 97.9 F (36.6 C) 98 F (36.7 C)  TempSrc: Axillary Oral Oral Oral  Resp: _0 Height:      Weight:    286 lb 13.1 oz (130.1 kg)  SpO2: 99% 99% 98% 97%   Weight change: -12 lb 2 oz (-5.5 kg)  Intake/Output Summary (Last 24 hours) at 02/13/12 0804 Last data filed at 02/13/12 0658  Gross per 24 hour  Intake    960 ml  Output   3025 ml  Net  -2065 ml   Physical Exam: Vitals reviewed. General: Sitting in a chair, NAD HEENT: PERRL, EOMI, no scleral icterus Cardiac: RRR, no rubs, murmurs or gallops Pulm: clear to auscultation bilaterally Abd: Obese, soft, nontender Ext: BLE with compression stockings on Neuro: Alert and oriented X3, non-focal  Lab Results: Basic Metabolic Panel:  Recent Labs Lab 02/11/12 1519 02/12/12 0002 02/12/12 0950  NA 139  --  143  K 3.8  --  3.0*  CL 104  --  103  CO2 23  --  28  GLUCOSE 92  --  118*  BUN 19  --  17  CREATININE 0.79 0.80 0.82  CALCIUM 8.8  --  9.3   Liver Function Tests:  Recent Labs Lab 02/11/12 1519  AST 30  ALT 20  ALKPHOS 86  BILITOT 1.0  PROT 6.7  ALBUMIN 3.3*   CBC:  Recent Labs Lab 02/11/12 1519 02/11/12 2359 02/12/12 0950  WBC 7.2 7.3 6.6  NEUTROABS 4.2  --   --   HGB 13.0 13.9 12.9  HCT 41.6 42.8 40.2  MCV 79.1 79.1 79.0  PLT 305 292 289   BNP:  Recent Labs Lab 02/11/12 1519  PROBNP 3892.0*   CBG:  Recent Labs Lab 02/11/12 2157 02/12/12 0613 02/12/12 1124 02/12/12 1619 02/12/12 2123  GLUCAP 72 90 123* 99 107*   Studies/Results: Dg Chest 2 View  02/12/2012  *RADIOLOGY REPORT*  Clinical Data: Short of breath, cough, congestion  CHEST - 2 VIEW  Comparison:  Portable chest x-ray of 02/11/2012  Findings: There are only small effusions remaining right larger than left.  The lungs are not well aerated with basilar atelectasis present.  Cardiomegaly is stable.  IMPRESSION: Poor aeration with basilar atelectasis and small effusions.   Original Report Authenticated By: Ivar Drape, M.D.    Dg Abd 1 View  02/12/2012  *RADIOLOGY REPORT*  Clinical Data: Increasing abdominal distention  ABDOMEN - 1 VIEW  Comparison: None.  Findings: Supine views the abdomen show no significant gaseous distention.  No opaque calculi are noted.  No acute skeletal abnormality is seen.  IMPRESSION: Nonspecific bowel gas pattern.  No significant gaseous distention.   Original Report Authenticated By: Ivar Drape, M.D.    Dg Chest Port 1 View  02/11/2012  *RADIOLOGY REPORT*  Clinical Data: Shortness of breath.  PORTABLE CHEST - 1 VIEW  Comparison: March 20, 2008.  Findings: Stable cardiomegaly.  Lung bases are incompletely visualized and therefore small effusions cannot be excluded.  Bony thorax is intact.  No acute pulmonary disease is noted.  IMPRESSION: Lung bases not completely visualized and therefore small bilateral pleural effusions cannot  be excluded.  No definite evidence of acute pulmonary disease is seen.  Stable cardiomegaly.   Original Report Authenticated By: Marijo Conception.,  M.D.    Medications: I have reviewed the patient's current medications. Scheduled Meds: . antiseptic oral rinse  15 mL Mouth Rinse BID  . enoxaparin (LOVENOX) injection  40 mg Subcutaneous Q24H  . fluticasone  2 spray Each Nare Daily  . furosemide  40 mg Intravenous Daily  . lisinopril  10 mg Oral Daily  . nystatin   Topical TID  . pantoprazole  20 mg Oral BID WC  . simvastatin  10 mg Oral q1800  . vitamin B-12  250 mcg Oral Daily   Continuous Infusions:  PRN Meds:.sodium chloride, acetaminophen, albuterol, hydrOXYzine, loratadine, ondansetron (ZOFRAN) IV Assessment/Plan: The patient is a 52 yo F  w/ PMH CHF, presenting with worsening SOB, c/w CHF exacerbation.  1.SOB/CHF exacerbation: Patient presented with elevated pro-BNP (3892), BLE edema, and increasing SOB on exertion, concerning for acute CHF exacerbation, though last echo (03/2008) showed only EF 65-70% though with abnormal LV relaxation.  Differential also includes untreated OSA vs OHS.  No evidence of anemia vs COPD (never smoker).  PE unlikely, though modified geneva on admission = 5 due to tachycardia. Weight down 12lbs and good uop on Lasix with improved respiratory status, pointing more towards CHF exacerbation..  - Checking echo for CHF - Increasing Lasix to 80 IV daily - Daily BMP for creatinine to watch for overdiuresis - CPAP qhs - Continue nebs prn for symptomatic relief - If symptoms don't resolve, reconsider further work-up for PE  2. Abdominal pruritis: The patient notes a history of pruritic papules on lower abdomen.  Examination of lower abdomen reveals several erythematous papules and excoriations.  The patient has a history of scabies, with questionable appropriate treatment.  Question if this is scabies vs residual epidermal irritation from recent scabies infection vs intertrigo.  - Hydroxyzine prn for itching - Nystatin powder prn for intertrigo - Beginning Permethrin treatment during this hospitalization to ensure complete scabies treatment  3. OSA: History of severe OSA -continue CPAP  4. Venous stasis ulcers: Followed by outpatient wound care -daily dressing changes  5. DM2: Last A1C = 6.2 11/2011. CBGs all less than 125. Will monitor and start SSI if needed. - CBG's qam  Dispo: Disposition is deferred at this time, awaiting improvement of current medical problems.  Anticipated discharge in approximately 2-3 day(s).   The patient does have a current PCP Sandy Salaam, Hoyle Sauer, MD), therefore will be requiring OPC follow-up after discharge.  Marland Kitchen  .Services Needed at time of discharge: Y = Yes, Blank = No PT:    OT:   RN:   Equipment:   Other:     LOS: 2 days   Clayburn Pert F 02/13/2012, 8:04 AM

## 2012-02-14 LAB — BASIC METABOLIC PANEL
Calcium: 8.8 mg/dL (ref 8.4–10.5)
Creatinine, Ser: 0.74 mg/dL (ref 0.50–1.10)
GFR calc Af Amer: >90 mL/min (ref >90)
GFR calc non Af Amer: >90 mL/min (ref >90)
Sodium: 139 mEq/L (ref 135–145)

## 2012-02-14 LAB — PRO B NATRIURETIC PEPTIDE: Pro B Natriuretic peptide (BNP): 3408 pg/mL — ABNORMAL HIGH (ref 0–125)

## 2012-02-14 MED ORDER — POTASSIUM CHLORIDE CRYS ER 20 MEQ PO TBCR
40.0000 meq | EXTENDED_RELEASE_TABLET | Freq: Once | ORAL | Status: AC
  Administered 2012-02-14: 40 meq via ORAL
  Filled 2012-02-14: qty 2

## 2012-02-14 NOTE — Discharge Summary (Signed)
[facility] Discharge Note  Name: [name] MRN: [id number] DOB: 23-Jun-1960 52 y.o.  Date of Admission: 02/11/2012  2:21 PM Date of Discharge: 02/15/2012 Attending Physician: Karren Cobble, MD  Discharge Diagnosis: Principal Problem:   CHF exacerbation Active Problems:   MENTAL RETARDATION   SLEEP APNEA, OBSTRUCTIVE   Venous stasis ulcer   Discharge Medications:   Medication List    TAKE these medications       albuterol 108 (90 BASE) MCG/ACT inhaler  Commonly known as:  PROVENTIL HFA;VENTOLIN HFA  Inhale 2 puffs into the lungs every 6 (six) hours as needed for wheezing.     fluticasone 50 MCG/ACT nasal spray  Commonly known as:  FLONASE  Place 2 sprays into the nose daily.     furosemide 40 MG tablet  Commonly known as:  LASIX  Take 1 tablet (40 mg total) by mouth daily.     ibuprofen 200 MG tablet  Commonly known as:  ADVIL,MOTRIN  Take 2 tablets (400 mg total) by mouth every 8 (eight) hours as needed. pain     lisinopril 20 MG tablet  Commonly known as:  PRINIVIL,ZESTRIL  Take 1 tablet (20 mg total) by mouth daily.     loratadine 10 MG tablet  Commonly known as:  CLARITIN  Take 1 tablet (10 mg total) by mouth daily as needed for allergies.     metFORMIN 1000 MG tablet  Commonly known as:  GLUCOPHAGE  Take 1,000 mg by mouth 2 (two) times daily with a meal.     nystatin powder  Commonly known as:  MYCOSTATIN  Apply 1 g topically 3 (three) times daily. Apply to skin folds around stomach and groin area 3 times daily     pantoprazole 20 MG tablet  Commonly known as:  PROTONIX  Take 1 tablet (20 mg total) by mouth 2 (two) times daily.     pravastatin 20 MG tablet  Commonly known as:  PRAVACHOL  Take 20 mg by mouth every evening.     vitamin B-12 250 MCG tablet  Commonly known as:  CYANOCOBALAMIN  Take 250 mcg by mouth daily.        Disposition and follow-up:   Faith.[name].  At the hospital follow up visit please address her medication compliance with her Lisinopril and Lasix. Evaluate her fluid status and the need to increase/decrease her Lasix dose. Please check a BMP to evaluate her electrolytes and her renal function. She did have a small area of thrombophlebitis with a small palpable cord at her IV site in her right antecubital fossa. She is applying warm compresses. Please make sure that this is improving.  Follow-up Appointments: Follow-up Information   Follow up with HO,MICHELE, MD On 02/22/2012. (Appointment is at 10:45am)    Contact information:   [address] [place] [address] [phone]      Discharge Orders   Future Appointments Provider Department Dept Phone   02/22/2012 10:45 AM Ansel Bong, MD [place] [phone]   03/06/2012 1:45 PM Tonia Brooms, MD [place] [phone]   Future Orders Complete By Expires     Activity as tolerated - No restrictions  As directed     Call MD for:  difficulty breathing, headache or visual disturbances  As directed     Call MD for:  persistant dizziness or light-headedness  As directed     Diet - low sodium heart healthy  As directed        Consultations:  None  Procedures Performed:  Dg Chest 2 View  02/12/2012  *RADIOLOGY REPORT*  Clinical Data: Short of breath, cough, congestion  CHEST - 2 VIEW  Comparison: Portable chest x-ray of 02/11/2012  Findings: There are only small effusions remaining right larger than left.  The lungs are not well aerated with basilar atelectasis present.  Cardiomegaly is stable.  IMPRESSION: Poor aeration with basilar atelectasis and small effusions.   Original Report Authenticated By: Ivar Drape, M.D.    Dg Abd 1 View  02/12/2012  *RADIOLOGY REPORT*  Clinical Data: Increasing abdominal distention  ABDOMEN - 1 VIEW  Comparison: None.  Findings: Supine views the  abdomen show no significant gaseous distention.  No opaque calculi are noted.  No acute skeletal abnormality is seen.  IMPRESSION: Nonspecific bowel gas pattern.  No significant gaseous distention.   Original Report Authenticated By: Ivar Drape, M.D.    Dg Chest Port 1 View  02/11/2012  *RADIOLOGY REPORT*  Clinical Data: Shortness of breath.  PORTABLE CHEST - 1 VIEW  Comparison: March 20, 2008.  Findings: Stable cardiomegaly.  Lung bases are incompletely visualized and therefore small effusions cannot be excluded.  Bony thorax is intact.  No acute pulmonary disease is noted.  IMPRESSION: Lung bases not completely visualized and therefore small bilateral pleural effusions cannot be excluded.  No definite evidence of acute pulmonary disease is seen.  Stable cardiomegaly.   Original Report Authenticated By: Marijo Conception.,  M.D.     2D Echo:  02/13/12: Study Conclusions  - Left ventricle: The cavity size was normal. Wall thickness was increased in a pattern of mild LVH. Systolic function was normal. The estimated ejection fraction was in the range of 55% to 60%. Wall motion was normal; there were no regional wall motion abnormalities. Septal wall flattening consistent with elevated right ventricular pressure/volume. Doppler parameters are consistent with abnormal left ventricular relaxation (grade 1 diastolic dysfunction). - Left atrium: The atrium was mildly dilated. - Right ventricle: The cavity size was severely dilated. Systolic function was severely reduced. - Right atrium: The atrium was moderately dilated. - Pulmonary arteries: Systolic pressure was severely increased. PA peak pressure: 58m Hg (S). - Pericardium, extracardiac: A moderate, free-flowing pericardial effusion was identified posterior to the heart and along the left ventricular free wall. There was no evidence of hemodynamic compromise. Impressions:  - When compared to prior echo from 2010, RV enlargement  and dysfunction is now present as well as effusion.  Admission History of Present Illness:  [name] a 52y.o w PMH severe OSA on cpap, DM 2, venous stasis ulcers, GERD, and anemia with chief complaint of dyspnea. Faith. Barker noticed increasing shortness of breath over the past couple of weeks, mainly with exertion. She awoke this morning and noticed acute worsening of her shortness of breath and reported that she had trouble walking to the bathroom from her bedroom. She reports occasional cough, sometimes productive of clear sputum. She has also noticed some wheezing over the past few days. She does take Lasix due to her chronic venous stasis disease, but missed her dose today. She denies any chest pain or pressure.  She has had one episode of nausea and vomiting a couple days ago. She has no abdominal pain and denies diarrhea. She also endorses increasing distention of her stomach. Last bowel movement was night prior  to admission and patient reports is normal.  Patient was treated about a month ago for reported vaginal cellulitis in the [facility] long emergency room. She followed up in clinic about 2 weeks ago and was noted to have persistent pinkish erythema of her suprapubic panus. She says the swelling is still present, but does not have much pain today. She completed a repeat course of Keflex about 2 weeks ago.  She's also had months of persistent pruritic lesions covering her abdomen arms and legs. She's been treated several times for scabies, but has not had skin scraping for microscopic confirmation. She says these areas are unchanged today. Review of Systems:  10 pt ROS performed, pertinent positives and negatives noted in HPI  Physical Exam:  Blood pressure 135/64, pulse 95, temperature 97.3 F (36.3 C), temperature source Oral, resp. rate 20, height _0  (1.6 m), weight 298 lb 15.1 oz (135.6 kg), SpO2 100.00%.  Constitutional: Vital signs reviewed. Patient is an obese female in no acute  distress and cooperative with exam. Alert and oriented x3.  Head: Normocephalic and atraumatic  Mouth: no erythema or exudates, MMM  Eyes: PERRL, EOMI, conjunctivae normal, No scleral icterus.  Neck: Supple, Trachea midline normal ROM, No JVD appreciated although difficult to discern with patient's large habitus  Cardiovascular: RRR, S1 normal, S2 normal, no MRG, 2+ radial pulses  Pulmonary/Chest: Good air movement, decreased BS in bilateral bases, no crackles appreciated. No wheezes on my exam.  Abdominal: Obese and distended. Tympanic percussion. No tenderness to palpation.  GU: no CVA tenderness Neurological: A&O x3, Strength is normal and symmetric bilaterally, cranial nerve II-XII are grossly intact, no focal motor deficit, sensory intact to light touch bilaterally.  Skin: Small, 5-10 mm excoriated lesions covering bilateral forearms, abdomen, and upper thighs. No purulent drainage, erythema, or warmth to suggest superinfection. No lesions noted finger webspaces. Bilateral lower extremities wrapped tightly by wound care for patient's venous stasis disease.  Psychiatric: Speech is slow and loud, patient has mild cognitive impairment which is chronic.    Hospital Course by problem list: he patient is a 52 yo F w/ PMH CHF, presenting with worsening SOB, c/w CHF exacerbation.  1. CHF exacerbation: Patient presented with elevated pro-BNP (3892), BLE edema, and increasing SOB on exertion, concerning for acute CHF exacerbation. On her previous ECHO from 03/2008, EF 65-70% with abnormal LV relaxation. ECHO from this admission with EF 88-50%, normal systolic function, grade 1 diastolic dysfunction, elevated PA pressures, and pericardial effusion w/o evidence of hemodynamic compromise. No pulsus paradoxus on exam. Symptoms also possibly compounded by OSA vs OHS. She was started on Lisinopril 13m which was increased to 264mprior to discahrge, and she was diuresed with IV Lasix. On the day of discharge,  her weight was down 15lbs and her I&Os are > -8L. Also with improved respiratory status with diuresis, pointing more towards CHF exacerbation. PE thought unlikely, though modified geneva on admission = 5 due to tachycardia. If SOB continues despite diuresis, consider PE workup.  - Lasix 4011mo daily - Lisinopril 63m74m daily - CPAP qhs - Continue nebs prn   2. Abdominal pruritis: The patient notes a history of pruritic papules on lower abdomen.  Examination of lower abdomen reveals several erythematous papules and excoriations.  The patient has a history of scabies, with questionable appropriate treatment.  Question if this is scabies vs residual epidermal irritation from recent scabies infection vs intertrigo. Treated her with Permethrin on 2/10. Itching appears to be improved. -  Nystatin powder prn for intertrigo  3. OSA: History of severe OSA. On CPAP at home which was continued on admission. Questionable compliance at home. - Continue CPAP after discharge  4. Venous stasis ulcers: Followed by outpatient wound care - Home health RN - Resume wound care at the wound care center after discharge  5. DM2: Last A1C = 6.2 11/2011. She is on Metformin at home, which was held this admission. CBGs all less than 125 this admission. There was no need for SSI. Will resume Metformin at discharge. Her blood glucose will need to be monitored as an outpatient.   6. Thrombophlebitis: She did have a small area of thrombophlebitis with a small palpable cord at her IV site in her right antecubital fossa that was noted after removal of the IV. Minimal purulence was able to be expelled from the site. She is to apply warm compresses after discharge and call the clinic if her pain or the redness worsens.   Discharge Vitals:  BP 118/72  Pulse 102  Temp(Src) 98.1 F (36.7 C) (Oral)  Resp 19  Ht 5' 3" (1.6 m)  Wt 283 lb 11.2 oz (128.685 kg)  BMI 50.27 kg/m2  SpO2 96%  Discharge Labs:  Results for orders  placed during the hospital encounter of 02/11/12 (from the past 24 hour(s))  BASIC METABOLIC PANEL     Status: Abnormal   Collection Time    02/15/12  4:25 AM      Result Value Range   Sodium 139  135 - 145 mEq/L   Potassium 3.3 (*) 3.5 - 5.1 mEq/L   Chloride 100  96 - 112 mEq/L   CO2 27  19 - 32 mEq/L   Glucose, Bld 102 (*) 70 - 99 mg/dL   BUN 20  6 - 23 mg/dL   Creatinine, Ser 0.81  0.50 - 1.10 mg/dL   Calcium 8.6  8.4 - 10.5 mg/dL   GFR calc non Af Amer 83 (*) >90 mL/min   GFR calc Af Amer >90  >90 mL/min    Signed: Otho Bellows 02/15/2012, 2:06 PM   Time Spent on Discharge: 72mn Services Ordered on Discharge: [facility] Discharge: None, she is to continue her home CPAP.

## 2012-02-14 NOTE — Consult Note (Signed)
WOC consult Note Reason for Consult: Consult requested for bilat legs. Pt is followed as outpatient by the outpatient wound care center and uses foam dressings with una boots which are changed Q week. Wound type: Healing full thickness stasis ulcers Measurement: Left leg 1.5X2X.1cm, right leg 3X3X.1cm Wound bed: both with red moist woundbeds Drainage (amount, consistency, odor) mod yellow drainage, no odor Periwound: skin protected with barrier cream to areas surrounding wounds Dressing procedure/placement/frequency: Foam dressings applied and ortho tech paged to apply bilat una boots and change Q Monday while in-patient.  She can resume follow-up with outpatient wound care center after discharge. Will not plan to follow further unless re-consulted.  [address] [place], MSN, [place]

## 2012-02-14 NOTE — Care Management Note (Unsigned)
    Page 1 of 1   02/14/2012     12:20:43 PM   CARE MANAGEMENT NOTE 02/14/2012  Patient:  SHATORIA, STOOKSBURY A   Account Number:  [id number]  Date Initiated:  02/14/2012  Documentation initiated by:  [facility]  Subjective/Objective Assessment:   52 year old woman with a history of DM, severe obstructive sleep apnea requiring CPAP, chronic venous stasis ulcers, presents with a several week history of progressive gradual worsening of SOB     Action/Plan:   IV lasix; wound care.  CM to asses for [facility] needs for CHF,   Anticipated DC Date:  02/17/2012   Anticipated DC Plan:  [place]  CM consult      Choice offered to / List presented to:  C-1 Patient        [place] arranged  HH-1 RN  [place].   Status of service:  Completed, signed off Medicare Important Message given?   (If response is "NO", the following Medicare IM given date fields will be blank) Date Medicare IM given:   Date Additional Medicare IM given:    Discharge Disposition:    Per UR Regulation:  Reviewed for med. necessity/level of care/duration of stay  If discussed at [place] of Stay Meetings, dates discussed:    Comments:  02/14/12 @ 1200.Marland KitchenMarland KitchenSpoke with pt regarding receiving HHRN for disease management of CHF.  Pt chose [facility] for services.  [name] notified.  Fuller Mandril, RN, BSN, [place] [phone].

## 2012-02-14 NOTE — Progress Notes (Signed)
Subjective: She states that her breathing has improved. She states that she is tired today. Wound Care was changing her dressing this morning. Her wounds look good, and they are healing well.  Objective: Vital signs in last 24 hours: Filed Vitals:   02/13/12 0440 02/13/12 1311 02/13/12 1958 02/14/12 0900  BP: 101/64 120/59 110/68 99/64  Pulse: 100 105 97 95  Temp: 98 F (36.7 C) 98.2 F (36.8 C) 97.2 F (36.2 C)   TempSrc: Oral Oral Oral   Resp: _0 Height:      Weight: 286 lb 13.1 oz (130.1 kg)     SpO2: 97% 91% 92% 89%   Weight change:   Intake/Output Summary (Last 24 hours) at 02/14/12 1136 Last data filed at 02/14/12 1129  Gross per 24 hour  Intake   1280 ml  Output   2250 ml  Net   -970 ml   Physical Exam: Vitals reviewed. General: Sitting in a chair, NAD HEENT: PERRL, EOMI, no scleral icterus Cardiac: RRR, no rubs, murmurs or gallops Pulm: Clear to auscultation bilaterally Abd: Obese, soft, nontender, +BS Ext: BLE each with one wound located on the anterior aspect, measuring 2-3cm in diameter, healing well. Neuro: Alert and oriented X3, non-focal  Lab Results: Basic Metabolic Panel:  Recent Labs Lab 02/13/12 0830 02/14/12 0700  NA 141 139  K 3.6 3.3*  CL 102 101  CO2 28 25  GLUCOSE 128* 110*  BUN 19 20  CREATININE 0.87 0.74  CALCIUM 9.0 8.8   Liver Function Tests:  Recent Labs Lab 02/11/12 1519  AST 30  ALT 20  ALKPHOS 86  BILITOT 1.0  PROT 6.7  ALBUMIN 3.3*   CBC:  Recent Labs Lab 02/11/12 1519 02/11/12 2359 02/12/12 0950  WBC 7.2 7.3 6.6  NEUTROABS 4.2  --   --   HGB 13.0 13.9 12.9  HCT 41.6 42.8 40.2  MCV 79.1 79.1 79.0  PLT 305 292 289   BNP:  Recent Labs Lab 02/11/12 1519 02/14/12 0700  PROBNP 3892.0* 3408.0*   CBG:  Recent Labs Lab 02/11/12 2157 02/12/12 0613 02/12/12 1124 02/12/12 1619 02/12/12 2123  GLUCAP 72 90 123* 99 107*   Studies/Results: No results found. Medications: I have reviewed the  patient's current medications. Scheduled Meds: . antiseptic oral rinse  15 mL Mouth Rinse BID  . enoxaparin (LOVENOX) injection  40 mg Subcutaneous Q24H  . fluticasone  2 spray Each Nare Daily  . furosemide  80 mg Intravenous Daily  . lisinopril  10 mg Oral Daily  . nystatin   Topical TID  . pantoprazole  20 mg Oral BID WC  . potassium chloride  40 mEq Oral Once  . simvastatin  10 mg Oral q1800  . vitamin B-12  250 mcg Oral Daily   Continuous Infusions:  PRN Meds:.sodium chloride, acetaminophen, albuterol, hydrOXYzine, loratadine, ondansetron (ZOFRAN) IV Assessment/Plan: The patient is a 52 yo F w/ PMH CHF, presenting with worsening SOB, c/w CHF exacerbation.  1.SOB/CHF exacerbation: Patient presented with elevated pro-BNP (3892), BLE edema, and increasing SOB on exertion, concerning for acute CHF exacerbation. However, last echo (03/2008) showed only EF 65-70% though with abnormal LV relaxation.  ECHO from this admission with EF 03-54%, normal systolic function, and grade 1 diastolic dysfunction. Possibly also compounded by OSA vs OHS.  PE unlikely, though modified geneva on admission = 5 due to tachycardia, which has resolved. Weight down 12lbs and her I&Os are > -7L.  Lasix with improved respiratory  status, pointing more towards CHF exacerbation..  - Lasix to 80 IV today, switching to po tomorrow - Daily BMP for creatinine to watch for overdiuresis - CPAP qhs - Continue nebs prn for symptomatic relief - If symptoms don't resolve, reconsider further work-up for PE  2. Abdominal pruritis/Scabies: The patient notes a history of pruritic papules on lower abdomen.  Examination of lower abdomen reveals several erythematous papules and excoriations. She has a history of scabies, with questionable appropriate treatment, so we retreated with Permethrin on 2/10. - Hydroxyzine prn for itching - Nystatin powder prn for intertrigo  3. OSA: History of severe OSA -continue CPAP  4. Venous stasis  ulcers: Followed by outpatient wound care -daily dressing changes  5. DM2: Last A1C = 6.2 11/2011. CBGs all less than 125. Will monitor and start SSI if needed. - CBG's qam  Dispo: Disposition is deferred at this time, awaiting improvement of current medical problems.  Anticipated discharge in approximately 2-3 day(s).   The patient does have a current PCP Sandy Salaam, Hoyle Sauer, MD), therefore will be requiring OPC follow-up after discharge.  Marland Kitchen  .Services Needed at time of discharge: Y = Yes, Blank = No PT:   OT:   RN:   Equipment:   Other:     LOS: 3 days   Otho Bellows 02/14/2012, 11:36 AM

## 2012-02-14 NOTE — Progress Notes (Signed)
Orthopedic Tech Progress Note Patient Details:  [name] 1960-07-01 [id number]  Ortho Devices Type of Ortho Device: Louretta Parma boot Ortho Device/Splint Location: bilateral Ortho Device/Splint Interventions: Application   [name] 02/14/2012, 2:22 PM

## 2012-02-14 NOTE — Progress Notes (Signed)
Utilization Review Completed Aloysius Heinle J. Clydene Laming, [place], [place], [employer] [phone]

## 2012-02-15 DIAGNOSIS — F79 Unspecified intellectual disabilities: Secondary | ICD-10-CM

## 2012-02-15 LAB — BASIC METABOLIC PANEL
Calcium: 8.6 mg/dL (ref 8.4–10.5)
GFR calc Af Amer: >90 mL/min (ref >90)
GFR calc non Af Amer: 83 mL/min — ABNORMAL LOW (ref >90)
Glucose, Bld: 102 mg/dL — ABNORMAL HIGH (ref 70–99)
Potassium: 3.3 mEq/L — ABNORMAL LOW (ref 3.5–5.1)
Sodium: 139 mEq/L (ref 135–145)

## 2012-02-15 MED ORDER — FUROSEMIDE 40 MG PO TABS
40.0000 mg | ORAL_TABLET | Freq: Every day | ORAL | Status: DC
  Administered 2012-02-15: 40 mg via ORAL
  Filled 2012-02-15: qty 1

## 2012-02-15 MED ORDER — LISINOPRIL 20 MG PO TABS: 20.0000 mg | ORAL_TABLET | Freq: Every day | ORAL | Status: DC

## 2012-02-15 MED ORDER — FUROSEMIDE 40 MG PO TABS
60.0000 mg | ORAL_TABLET | Freq: Every day | ORAL | Status: DC
  Filled 2012-02-15: qty 1

## 2012-02-15 MED ORDER — IBUPROFEN 200 MG PO TABS: 400.0000 mg | ORAL_TABLET | Freq: Three times a day (TID) | ORAL | Status: DC | PRN

## 2012-02-15 MED ORDER — LISINOPRIL 20 MG PO TABS
20.0000 mg | ORAL_TABLET | Freq: Every day | ORAL | Status: DC
  Administered 2012-02-15: 20 mg via ORAL
  Filled 2012-02-15: qty 1

## 2012-02-15 MED ORDER — POTASSIUM CHLORIDE CRYS ER 20 MEQ PO TBCR
40.0000 meq | EXTENDED_RELEASE_TABLET | Freq: Once | ORAL | Status: AC
  Administered 2012-02-15: 40 meq via ORAL
  Filled 2012-02-15: qty 2

## 2012-02-15 MED ORDER — FUROSEMIDE 40 MG PO TABS: 40.0000 mg | ORAL_TABLET | Freq: Every day | ORAL | Status: DC

## 2012-02-15 NOTE — Progress Notes (Signed)
Call to patient while in hospital. Will call after she is discharged to assist with transition of care.

## 2012-02-15 NOTE — Evaluation (Signed)
Physical Therapy Evaluation Patient Details Name: [name] MRN: [id number] DOB: 09-20-1960 Today's Date: 02/15/2012 Time: 8299-3716 PT Time Calculation (min): 30 min  PT Assessment / Plan / Recommendation Clinical Impression  52 y/o female admitted for sob in the setting of chf exacerbation. Presents to PT with below impairments affecting independence with mobility. Will benefit physical therapy in the acute setting to maximize functional independence and activity tolerance for safety on d/c home.     PT Assessment  Patient needs continued PT services    Follow Up Recommendations  Home health PT    Does the patient have the potential to tolerate intense rehabilitation      Barriers to Discharge        Equipment Recommendations  None recommended by PT    Recommendations for Other Services     Frequency Min 3X/week    Precautions / Restrictions Precautions Precautions: Fall Restrictions Weight Bearing Restrictions: No   Pertinent Vitals/Pain On RA, remained at or above 90% except for one drop to 88-89% after performing stairs, after a seated rest break pt recovered well to 94%      Mobility  Bed Mobility Bed Mobility: Supine to Sit;Sit to Supine Supine to Sit: 6: Modified independent (Device/Increase time) Sit to Supine: 6: Modified independent (Device/Increase time) Details for Bed Mobility Assistance: increased time and rest breaks due to dizziness and shortness of breath Transfers Transfers: Sit to Stand;Stand to Sit Sit to Stand: 6: Modified independent (Device/Increase time);From chair/3-in-1;From toilet;From bed Stand to Sit: 6: Modified independent (Device/Increase time);To chair/3-in-1;To toilet;To bed Details for Transfer Assistance: stands with increased time, doesn't utilize upper extremities to assist her Ambulation/Gait Ambulation/Gait Assistance: 5: Supervision Ambulation Distance (Feet): 50 Feet Assistive device: None Ambulation/Gait Assistance  Details: supervision for safety, needs several rest breaks to catch her breath and let her HR come down (was 115 after performing stairs); cues for proper breathing technique (saO2 following stairs had dropped to 89% but she recovered quickly with rest) Gait Pattern: Wide base of support General Gait Details: decreased speed and stride; occasionally reaching for external support because she is worried about getting dizzy with mobility and demonstrates increase in lateral sway Stairs: Yes Stairs Assistance: 6: Modified independent (Device/Increase time) Stair Management Technique: One rail Left;Step to pattern Number of Stairs: 3         PT Diagnosis: Difficulty walking;Abnormality of gait  PT Problem List: Decreased activity tolerance;Decreased balance;Cardiopulmonary status limiting activity;Obesity PT Treatment Interventions: DME instruction;Gait training;Functional mobility training;Therapeutic activities;Therapeutic exercise;Balance training;Neuromuscular re-education;Patient/family education   PT Goals Acute Rehab PT Goals PT Goal Formulation: With patient Time For Goal Achievement: 02/22/12 Potential to Achieve Goals: Good Pt will Ambulate: >150 feet;Independently (DOE 1/4) PT Goal: Ambulate - Progress: Goal set today Pt will Perform Home Exercise Program: Independently PT Goal: Perform Home Exercise Program - Progress: Goal set today  Visit Information  Last PT Received On: 02/15/12 Assistance Needed: +1    Subjective Data  Subjective: I want to be able to walk without getting dizzy. 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 Overall Cognitive Status: Appears within functional limits for tasks assessed/performed Arousal/Alertness: Awake/alert Orientation Level: Appears intact for tasks assessed Behavior During Session: [facility] for tasks performed    Extremity/Trunk Assessment Right Upper Extremity Assessment RUE ROM/Strength/Tone: [facility] for tasks assessed Left Upper Extremity Assessment LUE ROM/Strength/Tone: WFL for tasks assessed Right Lower Extremity Assessment RLE ROM/Strength/Tone: [facility] for tasks assessed Left Lower Extremity Assessment LLE ROM/Strength/Tone: WFL for tasks assessed   Balance    End of Session PT - End of Session Equipment Utilized During Treatment: Gait belt Activity Tolerance: Patient tolerated treatment well;Patient limited by fatigue Patient left: in chair Nurse Communication: Mobility status  GP     [place] 02/15/2012, 3:27 PM

## 2012-02-15 NOTE — Progress Notes (Signed)
Subjective: She states that her breathing continues to improve. She states that she lives with her son and is able and does manage her own medicaitons.  Objective: Vital signs in last 24 hours: Filed Vitals:   02/14/12 1417 02/14/12 1421 02/14/12 2100 02/15/12 0500  BP: 137/71 107/55 107/73 117/69  Pulse: 64 94 95 102  Temp:  97.1 F (36.2 C) 97.8 F (36.6 C) 97.4 F (36.3 C)  TempSrc:  Oral Oral Oral  Resp: _0 Height:      Weight:    283 lb 11.2 oz (128.685 kg)  SpO2: 93% 93% 98% 95%   Weight change:   Intake/Output Summary (Last 24 hours) at 02/15/12 1112 Last data filed at 02/15/12 0847  Gross per 24 hour  Intake   1044 ml  Output   2400 ml  Net  -1356 ml   Physical Exam: Vitals reviewed. General: Sitting on the side of the bed, NAD HEENT: PERRL, EOMI, no scleral icterus Cardiac: RRR, no rubs, murmurs or gallops; no pulsus paradoxus  Pulm: Clear to auscultation bilaterally Abd: Obese, soft, nontender, +BS Ext: BLE wrapped with ACE bandages Neuro: Alert and oriented X3, non-focal  Lab Results: Basic Metabolic Panel:  Recent Labs Lab 02/14/12 0700 02/15/12 0425  NA 139 139  K 3.3* 3.3*  CL 101 100  CO2 25 27  GLUCOSE 110* 102*  BUN 20 20  CREATININE 0.74 0.81  CALCIUM 8.8 8.6   Liver Function Tests:  Recent Labs Lab 02/11/12 1519  AST 30  ALT 20  ALKPHOS 86  BILITOT 1.0  PROT 6.7  ALBUMIN 3.3*   CBC:  Recent Labs Lab 02/11/12 1519 02/11/12 2359 02/12/12 0950  WBC 7.2 7.3 6.6  NEUTROABS 4.2  --   --   HGB 13.0 13.9 12.9  HCT 41.6 42.8 40.2  MCV 79.1 79.1 79.0  PLT 305 292 289   BNP:  Recent Labs Lab 02/11/12 1519 02/14/12 0700  PROBNP 3892.0* 3408.0*   CBG:  Recent Labs Lab 02/11/12 2157 02/12/12 0613 02/12/12 1124 02/12/12 1619 02/12/12 2123  GLUCAP 72 90 123* 99 107*   Studies/Results: No results found. Medications: I have reviewed the patient's current medications. Scheduled Meds: . antiseptic oral  rinse  15 mL Mouth Rinse BID  . enoxaparin (LOVENOX) injection  40 mg Subcutaneous Q24H  . fluticasone  2 spray Each Nare Daily  . furosemide  40 mg Oral Daily  . lisinopril  20 mg Oral Daily  . nystatin   Topical TID  . pantoprazole  20 mg Oral BID WC  . simvastatin  10 mg Oral q1800  . vitamin B-12  250 mcg Oral Daily   Continuous Infusions:  PRN Meds:.sodium chloride, acetaminophen, albuterol, hydrOXYzine, loratadine, ondansetron (ZOFRAN) IV Assessment/Plan: The patient is a 52 yo F w/ PMH CHF, presenting with worsening SOB, c/w CHF exacerbation.  1.SOB/CHF exacerbation: Patient presented with elevated pro-BNP (3892), BLE edema, and increasing SOB on exertion, concerning for acute CHF exacerbation. On her previous ECHO from 03/2008, EF 65-70% with abnormal LV relaxation.  ECHO from this admission with EF 46-65%, normal systolic function, grade 1 diastolic dysfunction, elevated PA pressures, and pericardial effusion w/o evidence of hemodynamic compromise. No pulsus paradoxus on exam. Symptoms also possibly compounded by OSA vs OHS.  Weight down 15lbs and her I&Os are > -8L.  Lasix with improved respiratory status, pointing more towards CHF exacerbation. - Changing Lasix to 34m po today - CPAP qhs  2. Abdominal pruritis/Scabies:  The patient notes a history of pruritic papules on lower abdomen.  Examination of lower abdomen reveals several erythematous papules and excoriations. She has a history of scabies, with questionable appropriate treatment, so we retreated with Permethrin on 2/10. - Hydroxyzine prn for itching - Nystatin powder prn for intertrigo  3. OSA: History of severe OSA -continue CPAP qhs  4. Venous stasis ulcers: Followed by outpatient wound care -daily dressing changes  5. DM2: Last A1C = 6.2 11/2011. CBGs all less than 125. Will monitor and start SSI if needed. - CBG's qam  Dispo: D/c home today with home health nursing  The patient does have a current PCP Sandy Salaam,  Hoyle Sauer, MD), therefore will be requiring OPC follow-up after discharge.  Marland Kitchen  .Services Needed at time of discharge: Y = Yes, Blank = No PT:   OT:   RN:  Y  Equipment:   Other:     LOS: 4 days   Otho Bellows 02/15/2012, 11:12 AM

## 2012-02-15 NOTE — Progress Notes (Signed)
Pt d/c to home. D/c instructions and medications reviewed w/Pt . PT states understanding. All Pt questions answer.

## 2012-02-15 NOTE — Progress Notes (Signed)
Internal Medicine Attending  Date: 02/15/2012  Patient name: [name] Medical record number: [id number] Date of birth: [birth year] Age: 52 y.o. Gender: female  I saw and evaluated the patient. I reviewed the resident's note by Dr. Eulas Post and I agree with the resident's findings and plans as documented in her progress note.  [name] feels much better today from a breathing standpoint. She states that she now is at baseline and feels ready to go home. I agree with the housestaff's plan to discharge her on Lasix 40 mg by mouth every morning and lisinopril 20 mg by mouth every morning. Her dry weight is approximately 283 pounds. As an outpatient she may benefit from an increase in her lisinopril dose or the addition of a beta blocker to her chronic diastolic heart failure regimen. Followup will be in the [facility] within the next week.

## 2012-02-16 ENCOUNTER — Telehealth: Payer: Self-pay | Admitting: Dietician

## 2012-02-16 NOTE — Telephone Encounter (Signed)
Unable to contact. Per phone message: phone is off

## 2012-02-21 NOTE — Telephone Encounter (Signed)
Discharge date:02-15-12 Call date: 02-16-12, 02-18-12 and 02-21-12 Hospital follow up appointment date:   Calling to assist with transition of care from hospital to home.  Discharge medications reviewed:no  Able to fill all prescriptions?yes  Patient aware of hospital follow up appointments. yes  No problems with transportation.patient denied  Other problems/concerns: patient denied

## 2012-02-22 ENCOUNTER — Ambulatory Visit (INDEPENDENT_AMBULATORY_CARE_PROVIDER_SITE_OTHER): Payer: Medicaid Other | Admitting: Internal Medicine

## 2012-02-22 ENCOUNTER — Encounter: Payer: Self-pay | Admitting: Internal Medicine

## 2012-02-22 VITALS — BP 109/80 | HR 97 | Temp 97.8°F | Ht 63.0 in | Wt 285.9 lb

## 2012-02-22 DIAGNOSIS — E119 Type 2 diabetes mellitus without complications: Secondary | ICD-10-CM

## 2012-02-22 DIAGNOSIS — I509 Heart failure, unspecified: Secondary | ICD-10-CM

## 2012-02-22 DIAGNOSIS — Z79899 Other long term (current) drug therapy: Secondary | ICD-10-CM

## 2012-02-22 LAB — BASIC METABOLIC PANEL WITH GFR
BUN: 17 mg/dL (ref 6–23)
GFR, Est African American: >89 mL/min
GFR, Est Non African American: >89 mL/min
Potassium: 4.3 mEq/L (ref 3.5–5.3)
Sodium: 141 mEq/L (ref 135–145)

## 2012-02-22 LAB — POCT GLYCOSYLATED HEMOGLOBIN (HGB A1C): Hemoglobin A1C: 6

## 2012-02-22 MED ORDER — FUROSEMIDE 80 MG PO TABS: 80.0000 mg | ORAL_TABLET | Freq: Every day | ORAL | Status: DC

## 2012-02-22 NOTE — Progress Notes (Signed)
[name], female   DOB: [birth year], 52 y.o.   MRN: [id number] HPI: [name] is a 52 yo woman with PMH of mental retardation,OSA, DM (HbA1c 6.2 44/0347), GERD, diastolic CHF who was recently admitted to hospital from 02/11/12 to 02/15/12 for CHF exacerbation with proBNP 3400's and 2 D echo showed EF of 55% with grade I diastolic heart failure, peaked PA pressure 89 mm Hg.  SHe was started on Lasix 62m qd. She was also treated for scabies while she was in the hospital with Permethrin as well.   Since hospital discharged, she reports feeling tired because she went to bed at 3am because of leg pain .   She noticed that her SOB is much, she is able to breath better and walking without having to stop.  She weighs herself at home, it was 265yesterday.  Unclear if the digital scale is accurate?  She is going to the bathroom more.  No chest pain.  Not really compliance with diet but trying her best to cut down on sodium. She is sleepy but she uses the cpap.     ROS: as per HPI  PE: General:  well-developed, and cooperative to examination. Somnolent, dosing off during my exam intermittently Lungs: normal respiratory effort, no accessory muscle use, normal breath sounds, no crackles, and no wheezes. Heart: distant heart sounds, normal rate, regular rhythm, no murmur, no gallop, and no rub. Unable to assess JVD due to large neck size Abdomen: obese, soft, non-tender, normal bowel sounds, no distention, no guarding, no rebound tenderness Neurologic: nonfocal EXT: +2 pitting edema up to abdomen with chronic venous insufficiency Skin:  chronic excoriation marks diffusedly

## 2012-02-22 NOTE — Patient Instructions (Addendum)
Please increase your Lasix to 64m one tablet daily Continue to weigh yourself at home and record it and bring it to the office next visit Get labs today and I will call you with any abnormal results Follow up with Dr. ZSandy Salaamin March 3rd @ 1:45PM

## 2012-02-22 NOTE — Assessment & Plan Note (Signed)
Well-controlled. Hemoglobin A1c was 6.2 back in November 2013. She's currently taking metformin 1000 mg by mouth twice a day. -Will repeat hbA1c today -Continue current medication

## 2012-02-22 NOTE — Assessment & Plan Note (Addendum)
Clinically, she is feeling better in term of shortness of breath but her weight is still up, she gained 2 pounds since hospital discharge on 2/11.  Her baseline weight is around 260s.  Her weight today is 285.9. I think she is still volume overloaded. Prior to hospitalization, she was on 20 mg of Lasix daily. Since hospital discharge she was prescribed 40 mg daily. -Will increase Lasix to 80 mg by mouth daily -Repeat BMP today and replace potassium if needed -Patient will followup with her PCP on March 3rd -Patient will need a followup BMP at followup visit

## 2012-03-06 ENCOUNTER — Ambulatory Visit: Payer: Medicaid Other | Admitting: Internal Medicine

## 2012-03-09 ENCOUNTER — Other Ambulatory Visit: Payer: Self-pay | Admitting: Internal Medicine

## 2012-03-09 NOTE — Telephone Encounter (Signed)
Refill request was received for furosemide 20 mg daily, but the dose has been changed anda new prescription order was done by Dr. Silverio Decamp on 02/22/12.  Patient has a followup appointment scheduled for 3/12.

## 2012-03-10 ENCOUNTER — Encounter (HOSPITAL_BASED_OUTPATIENT_CLINIC_OR_DEPARTMENT_OTHER): Payer: Medicaid Other | Attending: General Surgery

## 2012-03-10 DIAGNOSIS — I872 Venous insufficiency (chronic) (peripheral): Secondary | ICD-10-CM | POA: Insufficient documentation

## 2012-03-10 DIAGNOSIS — L97809 Non-pressure chronic ulcer of other part of unspecified lower leg with unspecified severity: Secondary | ICD-10-CM | POA: Insufficient documentation

## 2012-03-15 ENCOUNTER — Encounter: Payer: Self-pay | Admitting: Internal Medicine

## 2012-03-15 ENCOUNTER — Ambulatory Visit (INDEPENDENT_AMBULATORY_CARE_PROVIDER_SITE_OTHER): Payer: Medicaid Other | Admitting: Internal Medicine

## 2012-03-15 VITALS — BP 115/78 | HR 91 | Temp 96.9°F | Ht 62.0 in | Wt 260.9 lb

## 2012-03-15 DIAGNOSIS — I509 Heart failure, unspecified: Secondary | ICD-10-CM

## 2012-03-15 DIAGNOSIS — E119 Type 2 diabetes mellitus without complications: Secondary | ICD-10-CM

## 2012-03-15 DIAGNOSIS — I5030 Unspecified diastolic (congestive) heart failure: Secondary | ICD-10-CM

## 2012-03-15 DIAGNOSIS — J309 Allergic rhinitis, unspecified: Secondary | ICD-10-CM

## 2012-03-15 DIAGNOSIS — E785 Hyperlipidemia, unspecified: Secondary | ICD-10-CM

## 2012-03-15 DIAGNOSIS — I50810 Right heart failure, unspecified: Secondary | ICD-10-CM | POA: Insufficient documentation

## 2012-03-15 LAB — LIPID PANEL
Cholesterol: 91 mg/dL (ref 0–200)
Triglycerides: 78 mg/dL (ref <150)
VLDL: 16 mg/dL (ref 0–40)

## 2012-03-15 LAB — BASIC METABOLIC PANEL WITH GFR
BUN: 26 mg/dL — ABNORMAL HIGH (ref 6–23)
CO2: 27 mEq/L (ref 19–32)
Chloride: 101 mEq/L (ref 96–112)
Creat: 0.88 mg/dL (ref 0.50–1.10)
GFR, Est African American: 89 mL/min
Glucose, Bld: 88 mg/dL (ref 70–99)
Potassium: 3.6 mEq/L (ref 3.5–5.3)

## 2012-03-15 LAB — GLUCOSE, CAPILLARY

## 2012-03-15 MED ORDER — LORATADINE 10 MG PO TABS: 10.0000 mg | ORAL_TABLET | Freq: Every day | ORAL | Status: DC | PRN

## 2012-03-15 MED ORDER — LISINOPRIL 20 MG PO TABS: 20.0000 mg | ORAL_TABLET | Freq: Every day | ORAL | Status: DC

## 2012-03-15 NOTE — Assessment & Plan Note (Signed)
Lab Results  Component Value Date   HGBA1C 6.0 02/22/2012   HGBA1C 6.2 11/16/2011   HGBA1C 6.3 08/24/2011     Assessment:  Diabetes control:  excellent  Progress toward A1C goal:   At goal  Comments:   Plan:  Medications:  continue current medications  Home glucose monitoring: none   Frequency:     Timing:    Instruction/counseling given: reminded to get eye exam  Educational resources provided: brochure

## 2012-03-15 NOTE — Assessment & Plan Note (Signed)
Has been out of claritin for the past 2 weeks which I suspect is contributing to worsening cough as she has several allergic triggers. Was recently started on an ACE inhibitor about a month ago, which could be contributing to cough; however symptoms more consistent with an allergic cause at this time. - Refilled her Claritin today. Instructed her to continue taking it daily. If cough persists despite medication compliance, willl consider switching ACE inhibitor to an ARB.  - will have her return to the clinic in 2 weeks to followup

## 2012-03-15 NOTE — Patient Instructions (Addendum)
1. You are doing a GREAT JOB taking your medicines! 2. Continue to take 45m of lasix (furosemide) per day. 3. Continue taking all of your other medicines. Please fill your claritin (loratidine) prescription today.  4. Please [place]([phone] if you are worried about your cough and want an appointment.  5. Come back to see me in 2 weeks

## 2012-03-15 NOTE — Assessment & Plan Note (Addendum)
Patient is NYHA class I CHF with diastolic dysfunction. Echo during hospitalization in early February showed ejection fraction of 55% and grade 1 diastolic dysfunction. She has since tolerated Lasix therapy well and is adequately diureses. Last clinic weight one month ago was 285 pounds. Today it is 260. Creatinine is remained stable so far. She is on ACE inhibitor therapy as well. No aspirin due to allergy. She did not appear volume overloaded today and dyspnea has resolved. No orthopnea or PND. -continue Lasix 80 mg daily -Will check bmet today and monitor creatinine  ADDENDUM Lab Results  Component Value Date   CREATININE 0.88 03/15/2012  Creatinine stable.

## 2012-03-15 NOTE — Assessment & Plan Note (Addendum)
Lab Results  Component Value Date   CHOL 162 03/03/2011   HDL 40 03/03/2011   LDLCALC 99 03/03/2011   TRIG 114 03/03/2011   CHOLHDL 4.1 03/03/2011   At goal during last visit 1 year ago. Continue pravachol, check lipid panel today.   ADDENDUM Lab Results  Component Value Date   CHOL 91 03/15/2012   HDL 27* 03/15/2012   LDLCALC 48 03/15/2012   TRIG 78 03/15/2012   CHOLHDL 3.4 03/15/2012  Lipid profile looks good. HDL is low. Per AIM-High trial, for patients w LDL cholesterol levels of less than 70 mg/dL, no incremental clinical benefit from the addition of niacin to statin. Continue pravastatin therapy.

## 2012-03-15 NOTE — Progress Notes (Signed)
[name], female   DOB: 08-18-1960, 52 y.o.   MRN: [id number]  Subjective:   [name]   DOB: 1960/03/19 52 y.o.   MRN: [id number]  HPI: [name] is a 52 y.o. female with history of well-controlled type 2 diabetes mellitus, diastolic CHF, chronic venous stasis disease, obstructive sleep apnea, and chronic allergic rhinitis presenting for followup.  She was discharged from [facility] hospital 02/15/12 after treatment for CHF exacerbation. At the time, echo showed EF 55% and grade 1 diastolic dysfunction. Since that time, she has had one follow-up clinic visit last month, at which time she was doing well. Lasix was titrated up to 1m once daily. She is weighting herself at home and reports weights in upper 250s . Last clinic weight was 285. She reports interval improvement of dyspnea.  Denies orthopnea, PND. LE edema is improving, with less blisters. Continues to follow with wound care.  Denies CP, SOB, palpitations.  She does report interval increase of dry cough over the past month. She has run out of her claritin over the past 2 weeks, an in past allergies have been a trigger for her.  In regards to her diabetes, patient has been compliant with metformin therapy 1000 mg twice a day and dietary modifications. Since then, her glucose control has been excellent (HbA1c 02/22/12 6.0). No symptoms of hypoglycemia. No evidence of diabetic neuropathy or retinopathy.  In regards to her obstructive sleep apnea, she wears her CPAP nightly. Says she is due for MMG and ophtho exams, will make those appointments this month.    Past Medical History  Diagnosis Date  . OSA (obstructive sleep apnea)     CPAP  . Venous stasis ulcer     chornic, ?followed up at wound care center, multiple courses of antibiotics in past for cellulitis, on lasix  . GERD (gastroesophageal reflux disease)   . Anemia, iron deficiency     secondary to menhorrhagia, on oral iron, also b12 def,  getting monthly b12 shots  . Chronic cough     secondary to alleriges and post nasal drip  . Depression   . Diabetes mellitus   . Hyperlipidemia   . H/O mental retardation   . Herpes    Current Outpatient Prescriptions  Medication Sig Dispense Refill  . albuterol (PROVENTIL HFA;VENTOLIN HFA) 108 (90 BASE) MCG/ACT inhaler Inhale 2 puffs into the lungs every 6 (six) hours as needed for wheezing.  1 Inhaler  2  . fluticasone (FLONASE) 50 MCG/ACT nasal spray Place 2 sprays into the nose daily.  16 g  3  . furosemide (LASIX) 80 MG tablet Take 1 tablet (80 mg total) by mouth daily.  30 tablet  3  . ibuprofen (ADVIL,MOTRIN) 200 MG tablet Take 2 tablets (400 mg total) by mouth every 8 (eight) hours as needed. pain  30 tablet    . lisinopril (PRINIVIL,ZESTRIL) 20 MG tablet Take 1 tablet (20 mg total) by mouth daily.  30 tablet  11  . loratadine (CLARITIN) 10 MG tablet Take 1 tablet (10 mg total) by mouth daily as needed for allergies.  30 tablet  2  . metFORMIN (GLUCOPHAGE) 1000 MG tablet Take 1,000 mg by mouth 2 (two) times daily with a meal.      . nystatin (MYCOSTATIN) powder Apply 1 g topically 3 (three) times daily. Apply to skin folds around stomach and groin area 3 times daily      . pantoprazole (PROTONIX) 20 MG  tablet Take 1 tablet (20 mg total) by mouth 2 (two) times daily.  180 tablet  4  . pravastatin (PRAVACHOL) 20 MG tablet Take 20 mg by mouth every evening.      . vitamin B-12 (CYANOCOBALAMIN) 250 MCG tablet Take 250 mcg by mouth daily.       Current Facility-Administered Medications  Medication Dose Route Frequency Provider Last Rate Last Dose  . ipratropium (ATROVENT) 0.02 % nebulizer solution 0.5 mg  0.5 mg Nebulization 1 day or 1 dose Coralee Pesa, MD   0.5 mg at 06/30/10 1456   Family History  Problem Relation Age of Onset  . Mental illness Sister   . Mental retardation Brother    History   Social History  . Marital Status: Single    Spouse Name: N/A    Number of  Children: N/A  . Years of Education: N/A   Social History Main Topics  . Smoking status: Never Smoker   . Smokeless tobacco: Never Used  . Alcohol Use: No  . Drug Use: No  . Sexually Active: No   Other Topics Concern  . None   Social History Narrative  . None   Review of Systems: 10 pt ROS performed, pertinent positives and negatives noted in HPI   Objective:  Physical Exam: Filed Vitals:   03/15/12 1316  BP: 115/78  Pulse: 91  Temp: 96.9 F (36.1 C)  TempSrc: Oral  Height: _0  (1.575 m)  Weight: 260 lb 14.4 oz (118.343 kg)  SpO2: 97%   Constitutional: Vital signs reviewed. Patient is a well-developed and well-nourished female in no acute distress and cooperative with exam. Alert and oriented x3.  Head: Normocephalic and atraumatic  Mouth: no erythema or exudates, MMM  Eyes: PERRL, EOMI, conjunctivae normal, No scleral icterus.  Neck: No JVD appreciated Cardiovascular: RRR, S1 normal, S2 normal, no MRG, 2+ radial pulses  Pulmonary/Chest: CTAB, no wheezes or rales Abdominal: Soft. Non-tender, non-distended, bowel sounds are normal, no masses, organomegaly, or guarding present. .  Neurological: A&O x3, Strength is normal and symmetric bilaterally, cranial nerve II-XII are grossly intact, no focal motor deficit, sensory intact to light touch bilaterally.  Skin: Small, 5-10 mm excoriated lesions covering bilateral forearms, abdomen, and upper thighs. Bilateral lower sugar these wrapped tightly by wound care for patient's venous stasis disease.  Psychiatric: Speech is slow and loud, patient has mild cognitive impairment   Assessment & Plan:

## 2012-03-29 ENCOUNTER — Ambulatory Visit (INDEPENDENT_AMBULATORY_CARE_PROVIDER_SITE_OTHER): Payer: Medicaid Other | Admitting: Internal Medicine

## 2012-03-29 ENCOUNTER — Encounter: Payer: Self-pay | Admitting: Internal Medicine

## 2012-03-29 VITALS — BP 122/77 | HR 101 | Temp 96.2°F | Ht 62.0 in | Wt 255.4 lb

## 2012-03-29 DIAGNOSIS — R05 Cough: Secondary | ICD-10-CM

## 2012-03-29 NOTE — Progress Notes (Signed)
[name], female   DOB: 05/08/1960, 52 y.o.   MRN: [id number]  Subjective:   [name]   DOB: 07-Dec-1960 53 y.o.   MRN: [id number]  HPI: [name] is a 52 y.o. female with history of well-controlled type 2 diabetes mellitus, diastolic CHF, chronic venous stasis disease, obstructive sleep apnea, and chronic allergic rhinitis presenting for followup of cough.  At her last office visit 2 weeks ago, she complained of interval increase in dry cough. She has a chronic dry cough which had been attributed to GERD and allergic rhinitis in the past, and was previously well controlled on claritin and PPI. She had not been taking claritin at the time. There was concern at visit 2 weeks ago that since she had recently started lisinopril that cough may be exacerbated by ACE-i.  Today she says that dry cough persists, though coughing fits have improved since starting back claritin. Happens at all times of day. Says sometimes has tickle in throat and it makes her cough more.  She continues to use CPAP at night. Uses albuterol sparingly when having coughing fits. Ipratropium is on her list but she does not have asthma/COPD and says she is not taking it any more.  Denies fever, chills, productive cough, nasal congestion.    Past Medical History  Diagnosis Date  . OSA (obstructive sleep apnea)     CPAP  . Venous stasis ulcer     chornic, ?followed up at wound care center, multiple courses of antibiotics in past for cellulitis, on lasix  . GERD (gastroesophageal reflux disease)   . Anemia, iron deficiency     secondary to menhorrhagia, on oral iron, also b12 def, getting monthly b12 shots  . Chronic cough     secondary to alleriges and post nasal drip  . Depression   . Diabetes mellitus   . Hyperlipidemia   . H/O mental retardation   . Herpes    Current Outpatient Prescriptions  Medication Sig Dispense Refill  . albuterol (PROVENTIL HFA;VENTOLIN HFA) 108  (90 BASE) MCG/ACT inhaler Inhale 2 puffs into the lungs every 6 (six) hours as needed for wheezing.  1 Inhaler  2  . fluticasone (FLONASE) 50 MCG/ACT nasal spray Place 2 sprays into the nose daily.  16 g  3  . furosemide (LASIX) 80 MG tablet Take 1 tablet (80 mg total) by mouth daily.  30 tablet  3  . ibuprofen (ADVIL,MOTRIN) 200 MG tablet Take 2 tablets (400 mg total) by mouth every 8 (eight) hours as needed. pain  30 tablet    . loratadine (CLARITIN) 10 MG tablet Take 1 tablet (10 mg total) by mouth daily as needed for allergies.  30 tablet  2  . metFORMIN (GLUCOPHAGE) 1000 MG tablet Take 1,000 mg by mouth 2 (two) times daily with a meal.      . nystatin (MYCOSTATIN) powder Apply 1 g topically 3 (three) times daily. Apply to skin folds around stomach and groin area 3 times daily      . pantoprazole (PROTONIX) 20 MG tablet Take 1 tablet (20 mg total) by mouth 2 (two) times daily.  180 tablet  4  . pravastatin (PRAVACHOL) 20 MG tablet Take 20 mg by mouth every evening.      . vitamin B-12 (CYANOCOBALAMIN) 250 MCG tablet Take 250 mcg by mouth daily.       No current facility-administered medications for this visit.   Family History  Problem Relation  Age of Onset  . Mental illness Sister   . Mental retardation Brother    History   Social History  . Marital Status: Single    Spouse Name: N/A    Number of Children: N/A  . Years of Education: N/A   Social History Main Topics  . Smoking status: Never Smoker   . Smokeless tobacco: Never Used  . Alcohol Use: No  . Drug Use: No  . Sexually Active: No   Other Topics Concern  . Not on file   Social History Narrative  . No narrative on file   Review of Systems: 10 pt ROS performed, pertinent positives and negatives noted in HPI Objective:  Physical Exam: Filed Vitals:   03/29/12 1325  BP: 122/77  Pulse: 101  Temp: 96.2 F (35.7 C)  TempSrc: Oral  Height: _0  (1.575 m)  Weight: 255 lb 6.4 oz (115.849 kg)  SpO2: 94%    Constitutional: Vital signs reviewed. Patient is an overweight female in no acute distress and cooperative with exam. Alert and oriented x3.  Head: Normocephalic and atraumatic  Mouth: no erythema or exudates, MMM  Eyes: PERRL, EOMI, conjunctivae normal, No scleral icterus.  Neck: No JVD appreciated  Cardiovascular: RRR, S1 normal, S2 normal, no MRG, 2+ radial pulses  Pulmonary/Chest: CTAB, no wheezes or rales  Abdominal: Soft. Non-tender, non-distended, bowel sounds are normal, no masses, organomegaly, or guarding present. .  Neurological: A&O x3, Strength is normal and symmetric bilaterally, cranial nerve II-XII are grossly intact, no focal motor deficit, sensory intact to light touch bilaterally.  Skin:  Bilateral lower extremities  wrapped tightly by wound care for patient's venous stasis disease.  Psychiatric: Speech is slow and loud, patient has mild cognitive impairment  Assessment & Plan:   Please see problem-based charting for assessment and plan.

## 2012-03-29 NOTE — Assessment & Plan Note (Signed)
Patient with history of chronic dry cough that has gotten worse since starting ACE-i (lisinopril). She does have likely allergic rhinitis and GERD contributing, but cough persisted even after starting back with claritin, flonase and PPI. Has had CXR without concern for malignancy and is not a smoker. Our next step will be cessation of ACE-i for 1 month to see if sx improve. If sx do improve, we will need to consider changing to ARB. If not, she will need further evaluation for asthma related cough with pulmonary referral and methacholine challenge test. She does have allergic rhinitis and ASA allergy, so asthma-related cough is certainly possibility (AERD), but first we must exclude ACE-i cough.  She is agreeable to plan.  Plan formulated w assistance from Dr. Eppie Gibson.

## 2012-03-29 NOTE — Patient Instructions (Addendum)
1. STOP TAKING LISINOPRIL. 2. STOP TAKING ATROVENT/IPRATROPIUM. 3. COME BACK IN 1 MONTH

## 2012-04-10 ENCOUNTER — Encounter (HOSPITAL_BASED_OUTPATIENT_CLINIC_OR_DEPARTMENT_OTHER): Payer: Medicaid Other | Attending: General Surgery

## 2012-04-10 DIAGNOSIS — I87309 Chronic venous hypertension (idiopathic) without complications of unspecified lower extremity: Secondary | ICD-10-CM | POA: Insufficient documentation

## 2012-04-10 DIAGNOSIS — I872 Venous insufficiency (chronic) (peripheral): Secondary | ICD-10-CM | POA: Insufficient documentation

## 2012-04-10 DIAGNOSIS — L97809 Non-pressure chronic ulcer of other part of unspecified lower leg with unspecified severity: Secondary | ICD-10-CM | POA: Insufficient documentation

## 2012-04-17 ENCOUNTER — Other Ambulatory Visit: Payer: Self-pay | Admitting: Internal Medicine

## 2012-04-17 DIAGNOSIS — Z1231 Encounter for screening mammogram for malignant neoplasm of breast: Secondary | ICD-10-CM

## 2012-04-25 ENCOUNTER — Ambulatory Visit (HOSPITAL_COMMUNITY)
Admission: RE | Admit: 2012-04-25 | Discharge: 2012-04-25 | Disposition: A | Discharge status: Home / Self Care | Payer: Medicaid Other | Source: Ambulatory Visit | Attending: Internal Medicine | Admitting: Internal Medicine

## 2012-04-25 DIAGNOSIS — Z1231 Encounter for screening mammogram for malignant neoplasm of breast: Secondary | ICD-10-CM | POA: Insufficient documentation

## 2012-05-02 ENCOUNTER — Encounter: Payer: Self-pay | Admitting: Internal Medicine

## 2012-05-02 ENCOUNTER — Ambulatory Visit (INDEPENDENT_AMBULATORY_CARE_PROVIDER_SITE_OTHER): Payer: Medicaid Other | Admitting: Internal Medicine

## 2012-05-02 VITALS — BP 115/82 | HR 99 | Temp 96.5°F | Ht 63.0 in | Wt 252.5 lb

## 2012-05-02 DIAGNOSIS — E119 Type 2 diabetes mellitus without complications: Secondary | ICD-10-CM

## 2012-05-02 DIAGNOSIS — I5030 Unspecified diastolic (congestive) heart failure: Secondary | ICD-10-CM

## 2012-05-02 DIAGNOSIS — R05 Cough: Secondary | ICD-10-CM

## 2012-05-02 DIAGNOSIS — I509 Heart failure, unspecified: Secondary | ICD-10-CM

## 2012-05-02 MED ORDER — LOSARTAN POTASSIUM 25 MG PO TABS: 25.0000 mg | ORAL_TABLET | Freq: Every day | ORAL | Status: DC

## 2012-05-02 NOTE — Patient Instructions (Signed)
1. Start taking losartan (cozaar) 1 tablet a day. Do not take lisinopril any more.  2. Come back and see me in 1 month. 3. Call wound care to make your follow up.

## 2012-05-02 NOTE — Assessment & Plan Note (Signed)
Patient with history of chronic dry cough. She does have likely allergic rhinitis and GERD contributing, but cough persisted even after starting back with claritin, flonase and PPI. Has had CXR without concern for malignancy and is not a smoker. ACE-i was discontinued last month and in the interim she had noted much improvement in cough.  I will start an ARB today in place of ACE-i and want her to follow up in 1 month.  If cough worsens, she will need further evaluation for asthma related cough with pulmonary referral and methacholine challenge test. She does have allergic rhinitis and ASA allergy, so asthma-related cough is certainly possibility (AERD). She is agreeable to plan.

## 2012-05-02 NOTE — Progress Notes (Signed)
[name], female   DOB: [birth year], 52 y.o.   MRN: [id number]  Subjective:   [name]   DOB: 04/13/1960 52 y.o.   MRN: [id number]  HPI: [name] is a 52 y.o. female with history of well-controlled type 2 diabetes mellitus, diastolic CHF, chronic venous stasis disease, obstructive sleep apnea, and chronic allergic rhinitis presenting for followup of cough.  She has a chronic dry cough which had been attributed to GERD and allergic rhinitis in the past, and was previously well controlled on claritin and PPI. She has noted worsening of dry cough over the past couple of months. Recent CXR negative for infiltrate. She is a nonsmoker. Around the same time of worsening cough, lisinopril was added to med list.  At her visit last month, I instructed her to discontinue use of ACE-i x1 month to see if interval improvement of cough. Today she reports  .  She continues to use CPAP at night. Uses albuterol sparingly when having coughing fits. Denies fever, chills, productive cough, nasal congestion.  No chest pain, SOB, orthopnea, palpitations, DOE, worsening LE edema.  No worsening of chronic venous stasis ulcers, follows w wound care. Says she is calling to make f/u appt. No fever, chills, nausea, vomiting.    Past Medical History  Diagnosis Date  . OSA (obstructive sleep apnea)     CPAP  . Venous stasis ulcer     chornic, ?followed up at wound care center, multiple courses of antibiotics in past for cellulitis, on lasix  . GERD (gastroesophageal reflux disease)   . Anemia, iron deficiency     secondary to menhorrhagia, on oral iron, also b12 def, getting monthly b12 shots  . Chronic cough     secondary to alleriges and post nasal drip  . Depression   . Diabetes mellitus   . Hyperlipidemia   . H/O mental retardation   . Herpes    Current Outpatient Prescriptions  Medication Sig Dispense Refill  . albuterol (PROVENTIL HFA;VENTOLIN HFA) 108 (90  BASE) MCG/ACT inhaler Inhale 2 puffs into the lungs every 6 (six) hours as needed for wheezing.  1 Inhaler  2  . fluticasone (FLONASE) 50 MCG/ACT nasal spray Place 2 sprays into the nose daily.  16 g  3  . furosemide (LASIX) 80 MG tablet Take 1 tablet (80 mg total) by mouth daily.  30 tablet  3  . ibuprofen (ADVIL,MOTRIN) 200 MG tablet Take 2 tablets (400 mg total) by mouth every 8 (eight) hours as needed. pain  30 tablet    . loratadine (CLARITIN) 10 MG tablet Take 1 tablet (10 mg total) by mouth daily as needed for allergies.  30 tablet  2  . metFORMIN (GLUCOPHAGE) 1000 MG tablet Take 1,000 mg by mouth 2 (two) times daily with a meal.      . nystatin (MYCOSTATIN) powder Apply 1 g topically 3 (three) times daily. Apply to skin folds around stomach and groin area 3 times daily      . pantoprazole (PROTONIX) 20 MG tablet Take 1 tablet (20 mg total) by mouth 2 (two) times daily.  180 tablet  4  . pravastatin (PRAVACHOL) 20 MG tablet Take 20 mg by mouth every evening.      . vitamin B-12 (CYANOCOBALAMIN) 250 MCG tablet Take 250 mcg by mouth daily.       No current facility-administered medications for this visit.   Family History  Problem Relation Age of Onset  .  Mental illness Sister   . Mental retardation Brother    History   Social History  . Marital Status: Single    Spouse Name: N/A    Number of Children: N/A  . Years of Education: N/A   Social History Main Topics  . Smoking status: Never Smoker   . Smokeless tobacco: Never Used  . Alcohol Use: No  . Drug Use: No  . Sexually Active: No   Other Topics Concern  . Not on file   Social History Narrative  . No narrative on file   Review of Systems: 10 pt ROS performed, pertinent positives and negatives noted in HPI Objective:  Physical Exam: Filed Vitals:   05/02/12 1326  BP: 115/82  Pulse: 99  Temp: 96.5 F (35.8 C)  TempSrc: Oral  Height: _0  (1.6 m)  Weight: 252 lb 8 oz (114.533 kg)  SpO2: 98%   Constitutional:  Vital signs reviewed. Patient is an overweight female in no acute distress and cooperative with exam. Alert and oriented x3.  Head: Normocephalic and atraumatic  Mouth: no erythema or exudates, MMM  Eyes: PERRL, EOMI, conjunctivae normal, No scleral icterus.  Neck: No JVD appreciated  Cardiovascular: RRR, S1 normal, S2 normal, no MRG, 2+ radial pulses  Pulmonary/Chest: CTAB, no wheezes or rales  Abdominal: Soft. Non-tender, non-distended, bowel sounds are normal, no masses, organomegaly, or guarding present. .  Neurological: A&O x3, Strength is normal and symmetric bilaterally, cranial nerve II-XII are grossly intact, no focal motor deficit, sensory intact to light touch bilaterally.  Skin: Bilateral lower extremities w chronic hyperpigmentation and venous stasis changes. Some wounds bandaged, no interval increase in erythema/drainiage.   Psychiatric: Speech is slow and loud, patient has mild cognitive impairment  Assessment & Plan:   Please see problem-based charting for assessment and plan.

## 2012-05-02 NOTE — Assessment & Plan Note (Signed)
Doing well w no evidence volume overload on lasix 38m qd. Adding ARB today. She will need a beta blocker added to her treatment. Due to difficulty working up her chronic cough, and concern for possible reactive airway component, will defer initiation of beta blocker pending followup next month, as we are already adding a new medication today and do not want to muddy the picture.  - Lasix 855mqd - adding losartan today - B-blocker at next visit if no worsening of chronic cough. - Has ASA allergy

## 2012-05-05 ENCOUNTER — Encounter (HOSPITAL_BASED_OUTPATIENT_CLINIC_OR_DEPARTMENT_OTHER): Payer: Medicaid Other | Attending: General Surgery

## 2012-05-05 DIAGNOSIS — E119 Type 2 diabetes mellitus without complications: Secondary | ICD-10-CM | POA: Insufficient documentation

## 2012-05-05 DIAGNOSIS — J4489 Other specified chronic obstructive pulmonary disease: Secondary | ICD-10-CM | POA: Insufficient documentation

## 2012-05-05 DIAGNOSIS — L97909 Non-pressure chronic ulcer of unspecified part of unspecified lower leg with unspecified severity: Secondary | ICD-10-CM | POA: Insufficient documentation

## 2012-05-05 DIAGNOSIS — Z79899 Other long term (current) drug therapy: Secondary | ICD-10-CM | POA: Insufficient documentation

## 2012-05-05 DIAGNOSIS — I87319 Chronic venous hypertension (idiopathic) with ulcer of unspecified lower extremity: Secondary | ICD-10-CM | POA: Insufficient documentation

## 2012-05-05 DIAGNOSIS — J449 Chronic obstructive pulmonary disease, unspecified: Secondary | ICD-10-CM | POA: Insufficient documentation

## 2012-05-06 NOTE — Progress Notes (Signed)
[facility]  NAME:  [name], APPERSON NO.:  [id number]  MEDICAL RECORD NO.:  [id number]      DATE OF BIRTH:  26-Mar-1960  PHYSICIAN:  Judene Companion, M.D.           VISIT DATE:                                  OFFICE VISIT   HISTORY:  A 53 year old morbidly obese female who is well known to the [facility].  She was healed out a couple of months ago, now returns with more venous hypertension and chronic venous ulcers on her right leg.  When she came here today, she was found to have a blood pressure of 105/80.  She weighs 254 pounds.  She is 5 feet 3 inches, pulse 92, respirations 18.  Glucose 103.  She has a history of venous hypertension and chronic venous ulcers that we have been able to heal up using various means, among them Unna boots and collagen and in the form, and we finally got her to heal over, but she returns today with the same problem, and we are treating her with some silver alginate and Unna boots.  She is on many medicines including metformin for diabetes, albuterol for her COPD, Lasix, lisinopril, and losartan.  She is allergic to aspirin and sulfa.  She has a history of gastroesophageal reflux, depression, asthma, and thrombocytosis, and she has got a chronic neutrophilia and hypertension and sleep apnea, and of course, diabetes.  She will come back here in a week, and we will continue with the Unna boots and silver alginate.     Judene Companion, M.D.     PP/MEDQ  D:  05/05/2012  T:  05/06/2012  Job:  [id number]

## 2012-05-08 NOTE — Progress Notes (Signed)
INTERNAL MEDICINE TEACHING ATTENDING ADDENDUM - Sid Falcon, MD: I reviewed with the resident Dr. Sandy Salaam, Ms. Roe's  medical history, physical examination, diagnosis and results of tests and treatment and I agree with the patient's care as documented.

## 2012-05-31 ENCOUNTER — Encounter: Payer: Self-pay | Admitting: Internal Medicine

## 2012-05-31 ENCOUNTER — Ambulatory Visit (INDEPENDENT_AMBULATORY_CARE_PROVIDER_SITE_OTHER): Payer: Medicaid Other | Admitting: Internal Medicine

## 2012-05-31 VITALS — BP 104/76 | HR 96 | Temp 97.6°F | Ht 63.0 in | Wt 267.9 lb

## 2012-05-31 DIAGNOSIS — I50810 Right heart failure, unspecified: Secondary | ICD-10-CM

## 2012-05-31 DIAGNOSIS — L97909 Non-pressure chronic ulcer of unspecified part of unspecified lower leg with unspecified severity: Secondary | ICD-10-CM

## 2012-05-31 DIAGNOSIS — I83009 Varicose veins of unspecified lower extremity with ulcer of unspecified site: Secondary | ICD-10-CM

## 2012-05-31 DIAGNOSIS — E119 Type 2 diabetes mellitus without complications: Secondary | ICD-10-CM

## 2012-05-31 DIAGNOSIS — R05 Cough: Secondary | ICD-10-CM

## 2012-05-31 DIAGNOSIS — G4733 Obstructive sleep apnea (adult) (pediatric): Secondary | ICD-10-CM

## 2012-05-31 DIAGNOSIS — I509 Heart failure, unspecified: Secondary | ICD-10-CM

## 2012-05-31 MED ORDER — FUROSEMIDE 80 MG PO TABS: 80.0000 mg | ORAL_TABLET | Freq: Two times a day (BID) | ORAL | Status: DC

## 2012-05-31 NOTE — Assessment & Plan Note (Addendum)
On lasix 21m qd but with increasing weight (12lbs up), worsening LE and abdominal wall edema, as well as poor exercise tolerance.  ? Whether poor exercise tolerance multifactorial -  heart failure + pulmonary disease (COPD). Is due for PFTs. - Increase lasix to 818mBID. Follow up in 1 week for evaluation of volume status and Cr. - Referral for full PFTs.  - May eventually need specialist referral for severe PA HTN, for now will continue with diuresis to correct volume overload

## 2012-05-31 NOTE — Patient Instructions (Signed)
1. Increase your Lasix (furosemide) to TWICE A DAY 2. Come back and see me in one week. 3. We will schedule some breathing tests for you. Someone will call you with your appointment. 4. Keep following up with wound care and vascular. Complete your antibiotic as prescribed.

## 2012-05-31 NOTE — Assessment & Plan Note (Signed)
Chronic dry cough improved since discontinuation of ACE-i and addition of ARB.  She does have likely allergic rhinitis and GERD contributing. Has had CXR without concern for malignancy and is not a smoker. Had PFTs years ago, told she had chronic bronchitis but not asthma.  Still with some SOB and exercise intolerance, occasional wheezing relieved by albuterol inhaler.  - Full PFTs, methacholine challenge.

## 2012-05-31 NOTE — Progress Notes (Signed)
[name], female   DOB: 1960/10/03, 52 y.o.   MRN: [id number]  Subjective:   [name]   DOB: June 10, 1960 52 y.o.   MRN: [id number]  HPI: [name] is a 52 y.o. female with history of well-controlled type 2 diabetes mellitus, cor pulmonale w severe pulmonary HTN, chronic venous stasis disease, obstructive sleep apnea, and chronic allergic rhinitis presenting for followup. She has been seen several times recently for chronic dry cough which had been attributed to GERD and allergic rhinitis in the past, and was previously well controlled on claritin and PPI. She has noted worsening of dry cough over the past couple of months. Recent CXR negative for infiltrate. She is a lifeling nonsmoker.  Cough has improved since discontinuation of lisinopril and trial of losartan.  She still does report poor exercise tolerance and occasional episodes of wheezing and SOB relieved with albuterol inhaler. PFTs have been done years ago and readily available.  She also has cor pulmonale and severe pulm artery HTN (PA peak pressure 89), likely 2/2 OSA.  She continues to use CPAP at night. She has noticed increasing abdominal wall edema and edema of her mons pubis. No chest pain,orthopnea, palpitations. She has been taking lasix 88m daily with good control of volume status in the past, but today weight appears to be up 10 lbs from last month.  Continues to follow with wound care for chronic venous stasis ulcers. Was recently prescribed course of doxycycline for LE cellulitis of L foot which she continues to take. There is a new ulcer over her left second and third toes, for which she says she has been referred to vascular. Has follow up with wound care on Friday.  Denies fever, chills, dizziness, dysuria.   Past Medical History  Diagnosis Date  . OSA (obstructive sleep apnea)     CPAP  . Venous stasis ulcer     chornic, ?followed up at wound care center, multiple courses  of antibiotics in past for cellulitis, on lasix  . GERD (gastroesophageal reflux disease)   . Anemia, iron deficiency     secondary to menhorrhagia, on oral iron, also b12 def, getting monthly b12 shots  . Chronic cough     secondary to alleriges and post nasal drip  . Depression   . Diabetes mellitus   . Hyperlipidemia   . H/O mental retardation   . Herpes    Current Outpatient Prescriptions  Medication Sig Dispense Refill  . albuterol (PROVENTIL HFA;VENTOLIN HFA) 108 (90 BASE) MCG/ACT inhaler Inhale 2 puffs into the lungs every 6 (six) hours as needed for wheezing.  1 Inhaler  2  . fluticasone (FLONASE) 50 MCG/ACT nasal spray Place 2 sprays into the nose daily.  16 g  3  . furosemide (LASIX) 80 MG tablet Take 1 tablet (80 mg total) by mouth 2 (two) times daily.  60 tablet  3  . ibuprofen (ADVIL,MOTRIN) 200 MG tablet Take 2 tablets (400 mg total) by mouth every 8 (eight) hours as needed. pain  30 tablet    . loratadine (CLARITIN) 10 MG tablet Take 1 tablet (10 mg total) by mouth daily as needed for allergies.  30 tablet  2  . losartan (COZAAR) 25 MG tablet Take 1 tablet (25 mg total) by mouth daily.  30 tablet  11  . metFORMIN (GLUCOPHAGE) 1000 MG tablet Take 1,000 mg by mouth 2 (two) times daily with a meal.      .  nystatin (MYCOSTATIN) powder Apply 1 g topically 3 (three) times daily. Apply to skin folds around stomach and groin area 3 times daily      . pantoprazole (PROTONIX) 20 MG tablet Take 1 tablet (20 mg total) by mouth 2 (two) times daily.  180 tablet  4  . pravastatin (PRAVACHOL) 20 MG tablet Take 20 mg by mouth every evening.      . vitamin B-12 (CYANOCOBALAMIN) 250 MCG tablet Take 250 mcg by mouth daily.       No current facility-administered medications for this visit.   Family History  Problem Relation Age of Onset  . Mental illness Sister   . Mental retardation Brother    History   Social History  . Marital Status: Single    Spouse Name: N/A    Number of  Children: N/A  . Years of Education: N/A   Social History Main Topics  . Smoking status: Never Smoker   . Smokeless tobacco: Never Used  . Alcohol Use: No  . Drug Use: No  . Sexually Active: No   Other Topics Concern  . Not on file   Social History Narrative  . No narrative on file   Review of Systems: 10 pt ROS performed, pertinent positives and negatives noted in HPI Objective:  Physical Exam: Filed Vitals:   05/31/12 1417  BP: 104/76  Pulse: 96  Temp: 97.6 F (36.4 C)  TempSrc: Oral  Height: _0  (1.6 m)  Weight: 267 lb 14.4 oz (121.519 kg)  SpO2: 94%   Constitutional: Vital signs reviewed. Patient is an overweight female in no acute distress and cooperative with exam. Alert and oriented x3.  Head: Normocephalic and atraumatic  Mouth: no erythema or exudates, MMM  Eyes: PERRL, EOMI, conjunctivae normal, No scleral icterus.  Neck: Difficult to appreciate any JVD with plethoric neck  Cardiovascular: RRR, S1 normal, S2 normal, no MRG, 2+ radial pulses  Pulmonary/Chest: very fine bibasilar rales Abdominal: Abdominal wall edema and edema of mons pubis. No evidence of overlying cellulitis or infection.  GU: vulva are pink and dry, no discharge or overlying skin changes noted.  .  Neurological: A&O x3, Strength is normal and symmetric bilaterally, cranial nerve II-XII are grossly intact, no focal motor deficit, sensory intact to light touch bilaterally.  Skin: Bilateral lower extremities w chronic hyperpigmentation and venous stasis changes.Has well-demarcated ulcer over 2nd and 3rd toes of left foot with granulation tissue. Skin is mildly erythematous, no frank overlying edema or cellulitic changes. No bone visualized.  Psychiatric: Speech is slow and loud, patient has mild cognitive impairment  Assessment & Plan:   Please see problem-based charting for assessment and plan.

## 2012-05-31 NOTE — Assessment & Plan Note (Signed)
Likely cause of severe pulmonary artery HTN. Compliant w CPAP. - Continue CPAP use.

## 2012-05-31 NOTE — Assessment & Plan Note (Addendum)
Lab Results  Component Value Date   HGBA1C 6.0 05/31/2012   HGBA1C 6.0 02/22/2012   HGBA1C 6.2 11/16/2011     Assessment: Diabetes control: good control (HgbA1C at goal) Progress toward A1C goal:  at goal   Plan: Medications:  continue current medications Metformin 1000 BID

## 2012-05-31 NOTE — Assessment & Plan Note (Signed)
Followed at [facility]. Last seen on Friday. Was prescribed an antibiotic - she thinks doxycycline but can't remember. Concern for new ulcer overlying dorsum of second and third toes of R foot. Reports that she has a referral to vascular surgery made from wound clinic. She does not appear toxic or with evidence of systemic infection. Examination of foot reveals ulcer overlying 2nd and third toes with granulation tissue, no evidence of bony extension or abscess. Does not appear particularly cellulitic.  - Continue course of doxycycline as directed by wound care MD - Follow up vascular surgery recommendations - Wound Care appt this friday

## 2012-05-31 NOTE — Progress Notes (Signed)
Case discussed with Dr. Ziemer (at time of visit, soon after the resident saw the patient).  We reviewed the resident's history and exam and pertinent patient test results.  I agree with the assessment, diagnosis, and plan of care documented in the resident's note.  

## 2012-06-05 ENCOUNTER — Other Ambulatory Visit (HOSPITAL_COMMUNITY): Payer: Self-pay | Admitting: General Surgery

## 2012-06-05 DIAGNOSIS — I872 Venous insufficiency (chronic) (peripheral): Secondary | ICD-10-CM

## 2012-06-07 ENCOUNTER — Ambulatory Visit (INDEPENDENT_AMBULATORY_CARE_PROVIDER_SITE_OTHER): Payer: Medicaid Other | Admitting: Internal Medicine

## 2012-06-07 ENCOUNTER — Encounter: Payer: Self-pay | Admitting: Internal Medicine

## 2012-06-07 VITALS — BP 96/65 | HR 85 | Temp 97.0°F | Ht 63.0 in | Wt 262.0 lb

## 2012-06-07 DIAGNOSIS — I83009 Varicose veins of unspecified lower extremity with ulcer of unspecified site: Secondary | ICD-10-CM

## 2012-06-07 DIAGNOSIS — I509 Heart failure, unspecified: Secondary | ICD-10-CM

## 2012-06-07 DIAGNOSIS — I50810 Right heart failure, unspecified: Secondary | ICD-10-CM

## 2012-06-07 DIAGNOSIS — I2609 Other pulmonary embolism with acute cor pulmonale: Secondary | ICD-10-CM

## 2012-06-07 DIAGNOSIS — L97909 Non-pressure chronic ulcer of unspecified part of unspecified lower leg with unspecified severity: Secondary | ICD-10-CM

## 2012-06-07 LAB — BASIC METABOLIC PANEL WITH GFR
BUN: 44 mg/dL — ABNORMAL HIGH (ref 6–23)
CO2: 25 mEq/L (ref 19–32)
GFR, Est African American: 54 mL/min — ABNORMAL LOW
Glucose, Bld: 103 mg/dL — ABNORMAL HIGH (ref 70–99)
Potassium: 3.6 mEq/L (ref 3.5–5.3)

## 2012-06-07 NOTE — Assessment & Plan Note (Addendum)
Ulcers of L 2nd and 3rd toes examined again, appear to be healing, no evidence of overlying cellulitis.  - wound care appt Friday - LE dopplers pending - can add tylenol for pain control, on ibuprofen 600 QID.

## 2012-06-07 NOTE — Assessment & Plan Note (Addendum)
Interval improvement in peripheral edema and 6 lb weight loss on doubling lasix 80BID. Weight today 262, dry weight ?254 lbs. Still fluid overloaded.  Will continue lasix 80 BID for now. Her BP was borderline low and having some dizziness with exertion, will hold off on metolazone for now given these findings. Bmet ordered for today.  I think it would be helpful to get her plugged into HF clinic for her R sided HF.  Will make referral today.  Her poor exercise tolerance likely multifactorial - HF, pulm dz. Full PFTs to be performed next week.   ADDENDUM 06/08/2012 BMET    Component Value Date/Time   NA 141 06/07/2012 1502   K 3.6 06/07/2012 1502   CL 104 06/07/2012 1502   CO2 25 06/07/2012 1502   GLUCOSE 103* 06/07/2012 1502   BUN 44* 06/07/2012 1502   CREATININE 1.32* 06/07/2012 1502   CREATININE 0.81 02/15/2012 0425   CALCIUM 8.9 06/07/2012 1502   GFRNONAA 83* 02/15/2012 0425   GFRAA >90 02/15/2012 0425   Acute bump in BUN/Cr, likely from doubling of lasix, also had some dizziness. Called pt and LM to decrease nighttime lasix dose from 80-->49m. She will follow up 06/13/12. Discussed w Dr. BEllwood Dense

## 2012-06-07 NOTE — Progress Notes (Signed)
INTERNAL MEDICINE TEACHING ATTENDING ADDENDUM: I discussed this case with Dr. Sandy Salaam soon after the patient visit. I agree with her HPI, exam findings and diagnoses. I have read her documentation and I agree with her plan of care. Please see the resident note above for details of management.

## 2012-06-07 NOTE — Patient Instructions (Addendum)
1. You can take tylenol for pain, 625m three times a day. You can keep taking ibuprofen 3 pills no more than 4 times per day. 2. Keep taking your lasix twice a day. Come back to see me in one week.  3. I will refer you to a heart doctor. We will call you with your appointment.

## 2012-06-07 NOTE — Progress Notes (Signed)
[name], female   DOB: 1960-12-12, 52 y.o.   MRN: [id number]  Subjective:   [name]   DOB: [birth year] 52 y.o.   MRN: [id number]  HPI: [name] is a 52 y.o. female with history of well-controlled type 2 diabetes mellitus, cor pulmonale with severe pulm artery HTN, chronic venous stasis disease, obstructive sleep apnea, and chronic allergic rhinitis presenting for followup of increasing weight and peripheral edema.  She had been maintaining good volume status on lasix 48m daily, however at her visit last week, weight was up 12lbs from prior month and had worsening SOB and peripheral edema. Increased lasix to 80 BID x1 week and having her F/U today. Over the past week she reports improvement in the edema of her abdomen and legs. Still SOB w exertion. Occasionally dizzy with exertion. No CP or PND. Weight down 6 lbs from last week.  She also follows at wound care clinic for chronic venous stasis dermatitis and ulcers. Was recently treated for cellulitis of R foot. Next appt is this Friday. She was referred for LE dopplers by wound care MD, to be completed 6/12. Today she reports persistent pain over digital ulcers of L toes but says they are healing better. Is taking ibuprofen for the pain.   Past Medical History  Diagnosis Date  . OSA (obstructive sleep apnea)     CPAP  . Venous stasis ulcer     chornic, ?followed up at wound care center, multiple courses of antibiotics in past for cellulitis, on lasix  . GERD (gastroesophageal reflux disease)   . Anemia, iron deficiency     secondary to menhorrhagia, on oral iron, also b12 def, getting monthly b12 shots  . Chronic cough     secondary to alleriges and post nasal drip  . Depression   . Diabetes mellitus   . Hyperlipidemia   . H/O mental retardation   . Herpes    Current Outpatient Prescriptions  Medication Sig Dispense Refill  . albuterol (PROVENTIL HFA;VENTOLIN HFA) 108 (90 BASE)  MCG/ACT inhaler Inhale 2 puffs into the lungs every 6 (six) hours as needed for wheezing.  1 Inhaler  2  . fluticasone (FLONASE) 50 MCG/ACT nasal spray Place 2 sprays into the nose daily.  16 g  3  . furosemide (LASIX) 80 MG tablet Take 1 tablet (80 mg total) by mouth 2 (two) times daily.  60 tablet  3  . ibuprofen (ADVIL,MOTRIN) 200 MG tablet Take 2 tablets (400 mg total) by mouth every 8 (eight) hours as needed. pain  30 tablet    . loratadine (CLARITIN) 10 MG tablet Take 1 tablet (10 mg total) by mouth daily as needed for allergies.  30 tablet  2  . losartan (COZAAR) 25 MG tablet Take 1 tablet (25 mg total) by mouth daily.  30 tablet  11  . metFORMIN (GLUCOPHAGE) 1000 MG tablet Take 1,000 mg by mouth 2 (two) times daily with a meal.      . nystatin (MYCOSTATIN) powder Apply 1 g topically 3 (three) times daily. Apply to skin folds around stomach and groin area 3 times daily      . pantoprazole (PROTONIX) 20 MG tablet Take 1 tablet (20 mg total) by mouth 2 (two) times daily.  180 tablet  4  . pravastatin (PRAVACHOL) 20 MG tablet Take 20 mg by mouth every evening.      . vitamin B-12 (CYANOCOBALAMIN) 250 MCG tablet Take 250 mcg by  mouth daily.       No current facility-administered medications for this visit.   Family History  Problem Relation Age of Onset  . Mental illness Sister   . Mental retardation Brother    History   Social History  . Marital Status: Single    Spouse Name: N/A    Number of Children: N/A  . Years of Education: N/A   Social History Main Topics  . Smoking status: Never Smoker   . Smokeless tobacco: Never Used  . Alcohol Use: No  . Drug Use: No  . Sexually Active: No   Other Topics Concern  . None   Social History Narrative  . None   Review of Systems: 10 pt ROS performed, pertinent positives and negatives noted in HPI Objective:  Physical Exam: Filed Vitals:   06/07/12 1426  BP: 96/65  Pulse: 85  Temp: 97 F (36.1 C)  TempSrc: Oral  Height: 5'  3" (1.6 m)  Weight: 262 lb (118.842 kg)  SpO2: 99%   Constitutional: Vital signs reviewed. Patient is an overweight female in no acute distress and cooperative with exam. Alert and oriented x3.  Head: Normocephalic and atraumatic  Eyes: PERRL, EOMI, conjunctivae normal, No scleral icterus.  Neck: Difficult to appreciate any JVD with plethoric neck  Cardiovascular: RRR, S1 normal, S2 normal, no MRG, 2+ radial pulses  Pulmonary/Chest: very fine bibasilar rales  Abdominal: Interval improvement in adominal wall edema and edema of mons pubis. No evidence of overlying cellulitis or infection.  Neurological: A&O x3, Strength is normal and symmetric bilaterally, cranial nerve II-XII are grossly intact, no focal motor deficit, sensory intact to light touch bilaterally.  Skin: Bilateral lower extremities w chronic hyperpigmentation and venous stasis changes. Bandaged to the knees. Bandages of L foot removed to examine toes. Well-demarcated ulcer over 2nd and 3rd toes of left foot with granulation tissue, interval improvement in appearance from last week. Skin is mildly erythematous, no frank overlying edema or cellulitic changes. No bone visualized.  Psychiatric: Speech is slow and loud, patient has mild cognitive impairment  Assessment & Plan:   Please see problem-based charting for assessment and plan.

## 2012-06-09 ENCOUNTER — Encounter (HOSPITAL_BASED_OUTPATIENT_CLINIC_OR_DEPARTMENT_OTHER): Payer: Medicaid Other | Attending: General Surgery

## 2012-06-09 DIAGNOSIS — L97909 Non-pressure chronic ulcer of unspecified part of unspecified lower leg with unspecified severity: Secondary | ICD-10-CM | POA: Insufficient documentation

## 2012-06-09 DIAGNOSIS — L97509 Non-pressure chronic ulcer of other part of unspecified foot with unspecified severity: Secondary | ICD-10-CM | POA: Insufficient documentation

## 2012-06-09 DIAGNOSIS — E1169 Type 2 diabetes mellitus with other specified complication: Secondary | ICD-10-CM | POA: Insufficient documentation

## 2012-06-09 DIAGNOSIS — I87319 Chronic venous hypertension (idiopathic) with ulcer of unspecified lower extremity: Secondary | ICD-10-CM | POA: Insufficient documentation

## 2012-06-13 ENCOUNTER — Ambulatory Visit (INDEPENDENT_AMBULATORY_CARE_PROVIDER_SITE_OTHER): Payer: Medicaid Other | Admitting: Internal Medicine

## 2012-06-13 ENCOUNTER — Encounter: Payer: Self-pay | Admitting: Internal Medicine

## 2012-06-13 VITALS — BP 126/77 | HR 106 | Temp 96.7°F | Ht 62.0 in | Wt 265.7 lb

## 2012-06-13 DIAGNOSIS — I509 Heart failure, unspecified: Secondary | ICD-10-CM

## 2012-06-13 DIAGNOSIS — I50810 Right heart failure, unspecified: Secondary | ICD-10-CM

## 2012-06-13 DIAGNOSIS — E1149 Type 2 diabetes mellitus with other diabetic neurological complication: Secondary | ICD-10-CM

## 2012-06-13 LAB — BASIC METABOLIC PANEL
BUN: 78 mg/dL — ABNORMAL HIGH (ref 6–23)
CO2: 26 mEq/L (ref 19–32)
Glucose, Bld: 78 mg/dL (ref 70–99)
Potassium: 4.1 mEq/L (ref 3.5–5.3)
Sodium: 140 mEq/L (ref 135–145)

## 2012-06-13 MED ORDER — FUROSEMIDE 40 MG PO TABS: 40.0000 mg | ORAL_TABLET | Freq: Every day | ORAL | Status: DC

## 2012-06-13 NOTE — Progress Notes (Signed)
[name], female   DOB: Mar 05, 1960, 52 y.o.   MRN: [id number]  Subjective:   [name]   DOB: [birth year] 52 y.o.   MRN: [id number]  HPI: [name] is a 52 y.o. female with history of well-controlled type 2 diabetes mellitus, cor pulmonale with severe pulm artery HTN, chronic venous stasis disease, obstructive sleep apnea, and chronic allergic rhinitis presenting for followup of edema and shortness of breath.  It has been difficult to sort out how much of her symptoms are caused by right-sided heart failure and volume overload versus possible chronic lung disease. She's undergoing workup for chronic cough and has PFTs scheduled for tomorrow.  When I had seen HER-2 weeks ago, her weight was up and so I double her Lasix to 80 mg twice a day. At her followup visit last week lab results returned after the visit showing interval acute renal failure consistent with prerenal azotemia, likely from overdiuresis. I instructed her to decrease her Lasix to 80 in the morning and 40 at night. Since that time she has had persistent shortness of breath with exercise, as well as some dizziness when she stands from a seated position. She's not passed out. Her weight is up today a couple of pounds from last week.  She also follows at wound care clinic for chronic venous stasis dermatitis and ulcers. Was recently treated for cellulitis of R foot. Next appt is Thursday. She was referred for LE dopplers by wound care MD, to be completed 6/12. Today she reports some improvement in the pain over digital ulcers of L toes. Is taking ibuprofen and acetaminophen for the pain.      Past Medical History  Diagnosis Date  . OSA (obstructive sleep apnea)     CPAP  . Venous stasis ulcer     chornic, ?followed up at wound care center, multiple courses of antibiotics in past for cellulitis, on lasix  . GERD (gastroesophageal reflux disease)   . Anemia, iron deficiency    secondary to menhorrhagia, on oral iron, also b12 def, getting monthly b12 shots  . Chronic cough     secondary to alleriges and post nasal drip  . Depression   . Diabetes mellitus   . Hyperlipidemia   . H/O mental retardation   . Herpes    Current Outpatient Prescriptions  Medication Sig Dispense Refill  . albuterol (PROVENTIL HFA;VENTOLIN HFA) 108 (90 BASE) MCG/ACT inhaler Inhale 2 puffs into the lungs every 6 (six) hours as needed for wheezing.  1 Inhaler  2  . fluticasone (FLONASE) 50 MCG/ACT nasal spray Place 2 sprays into the nose daily.  16 g  3  . furosemide (LASIX) 40 MG tablet Take 1 tablet (40 mg total) by mouth daily.  60 tablet  3  . ibuprofen (ADVIL,MOTRIN) 200 MG tablet Take 2 tablets (400 mg total) by mouth every 8 (eight) hours as needed. pain  30 tablet    . loratadine (CLARITIN) 10 MG tablet Take 1 tablet (10 mg total) by mouth daily as needed for allergies.  30 tablet  2  . losartan (COZAAR) 25 MG tablet Take 1 tablet (25 mg total) by mouth daily.  30 tablet  11  . metFORMIN (GLUCOPHAGE) 1000 MG tablet Take 1,000 mg by mouth 2 (two) times daily with a meal.      . nystatin (MYCOSTATIN) powder Apply 1 g topically 3 (three) times daily. Apply to skin folds around stomach and groin area  3 times daily      . pantoprazole (PROTONIX) 20 MG tablet Take 1 tablet (20 mg total) by mouth 2 (two) times daily.  180 tablet  4  . pravastatin (PRAVACHOL) 20 MG tablet Take 20 mg by mouth every evening.      . vitamin B-12 (CYANOCOBALAMIN) 250 MCG tablet Take 250 mcg by mouth daily.       No current facility-administered medications for this visit.   Family History  Problem Relation Age of Onset  . Mental illness Sister   . Mental retardation Brother    History   Social History  . Marital Status: Single    Spouse Name: N/A    Number of Children: N/A  . Years of Education: N/A   Social History Main Topics  . Smoking status: Never Smoker   . Smokeless tobacco: Never Used  .  Alcohol Use: No  . Drug Use: No  . Sexually Active: No   Other Topics Concern  . None   Social History Narrative  . None   Review of Systems: 10 pt ROS performed, pertinent positives and negatives noted in HPI Objective:  Physical Exam: Filed Vitals:   06/13/12 1510  BP: 126/77  Pulse: 106  Temp: 96.7 F (35.9 C)  TempSrc: Oral  Height: 5' 2" (1.575 m)  Weight: 265 lb 11.2 oz (120.521 kg)  SpO2: 96%   Constitutional: Vital signs reviewed. Patient is an overweight female in no acute distress and cooperative with exam. Alert and oriented x3.  Head: Normocephalic and atraumatic  Eyes: PERRL, EOMI, conjunctivae normal, No scleral icterus.  Neck: Difficult to appreciate any JVD with plethoric neck  Cardiovascular: RRR, S1 normal, S2 normal, no MRG, 2+ radial pulses  Pulmonary/Chest: clear without rales Abdominal: no abdominal wall edema appreciated today Neurological: A&O x3, Strength is normal and symmetric bilaterally, cranial nerve II-XII are grossly intact, no focal motor deficit, sensory intact to light touch bilaterally.  Skin: Bilateral lower extremities w chronic hyperpigmentation and venous stasis changes. Bandaged to the knees. Psychiatric: Speech is slow and loud, patient has mild cognitive impairment  Assessment & Plan:   Please see problem-based charting for assessment and plan.

## 2012-06-13 NOTE — Patient Instructions (Signed)
1. Decrease lasix to 30m a day (1/2 tablet) 2. Come back in 2 weeks.

## 2012-06-13 NOTE — Assessment & Plan Note (Addendum)
Patient has had several months of worsening exercise tolerance. I'm having difficulties discerning how much of this is true dyspnea related to low cardiac output from right-sided heart failure versus related to chronic lung disease given her chronic cough. Pulmonary workup is underway at this time. She's had normal chest x-rays, is a nonsmoker, and has had a trial off of an ACE inhibitor. PFTs will be done tomorrow, I'm not sure how much these will contribute. In terms of her heart failure, she had acute renal failure and prerenal azotemia after doubling of her Lasix dose. Today her weight is up a couple pounds from last week. I'm not sure how much diuresis is actually helping her breathing. She is also having increased dizziness.  - Decrease lasix to 8m daily - repeat BMet - Continue CPAP at night, patient very compliant - Evaluation in HF clinic, interested for their evaluation of how much RHF is contributing to current symptomatology and how much diuresis is helping her - complete pulm w/u w PFTs   ADDENDUM 06/14/2012 10:09 AM  BMET    Component Value Date/Time   NA 140 06/13/2012 1615   K 4.1 06/13/2012 1615   CL 106 06/13/2012 1615   CO2 26 06/13/2012 1615   GLUCOSE 78 06/13/2012 1615   BUN 78* 06/13/2012 1615   CREATININE 1.52* 06/13/2012 1615   CREATININE 0.81 02/15/2012 0425   CALCIUM 9.2 06/13/2012 1615   GFRNONAA 83* 02/15/2012 0425   GFRAA >90 02/15/2012 0425   Increasing BUN:Cr on BID lasix, appears patient has been overdiuresed. Scaled back from 845mqam and 4035mpm to 68m62mily. Will call patient and instruct to hold lasix for 3 days then resume with lower dose 68mg21mly.

## 2012-06-13 NOTE — Progress Notes (Signed)
Case discussed with Dr. Sandy Salaam (at time of visit, soon after the resident saw the patient).  We reviewed the resident's history and exam and pertinent patient test results.  I agree with the assessment, diagnosis, and plan of care documented in the resident's note.

## 2012-06-14 ENCOUNTER — Ambulatory Visit (HOSPITAL_COMMUNITY)
Admission: RE | Admit: 2012-06-14 | Discharge: 2012-06-14 | Disposition: A | Discharge status: Home / Self Care | Payer: Medicaid Other | Source: Ambulatory Visit | Attending: Internal Medicine | Admitting: Internal Medicine

## 2012-06-14 DIAGNOSIS — E119 Type 2 diabetes mellitus without complications: Secondary | ICD-10-CM

## 2012-06-14 DIAGNOSIS — R05 Cough: Secondary | ICD-10-CM | POA: Insufficient documentation

## 2012-06-14 DIAGNOSIS — R059 Cough, unspecified: Secondary | ICD-10-CM | POA: Insufficient documentation

## 2012-06-14 MED ORDER — ALBUTEROL SULFATE (5 MG/ML) 0.5% IN NEBU
2.5000 mg | INHALATION_SOLUTION | Freq: Once | RESPIRATORY_TRACT | Status: AC
  Administered 2012-06-14: 2.5 mg via RESPIRATORY_TRACT

## 2012-06-15 ENCOUNTER — Ambulatory Visit (HOSPITAL_COMMUNITY)
Admission: RE | Admit: 2012-06-15 | Discharge: 2012-06-15 | Disposition: A | Discharge status: Home / Self Care | Payer: Medicaid Other | Source: Ambulatory Visit | Attending: Cardiovascular Disease | Admitting: Cardiovascular Disease

## 2012-06-15 DIAGNOSIS — I872 Venous insufficiency (chronic) (peripheral): Secondary | ICD-10-CM | POA: Insufficient documentation

## 2012-06-15 DIAGNOSIS — Z48812 Encounter for surgical aftercare following surgery on the circulatory system: Secondary | ICD-10-CM

## 2012-06-15 DIAGNOSIS — M7989 Other specified soft tissue disorders: Secondary | ICD-10-CM

## 2012-06-15 NOTE — Progress Notes (Signed)
Venous Lower Ext. Completed. Oda Cogan, RDMS, RVT

## 2012-06-24 ENCOUNTER — Other Ambulatory Visit: Payer: Self-pay | Admitting: Internal Medicine

## 2012-06-26 ENCOUNTER — Encounter (HOSPITAL_COMMUNITY): Payer: Self-pay

## 2012-06-26 ENCOUNTER — Ambulatory Visit (HOSPITAL_COMMUNITY)
Admission: RE | Admit: 2012-06-26 | Discharge: 2012-06-26 | Disposition: A | Discharge status: Home / Self Care | Payer: Medicaid Other | Source: Ambulatory Visit | Attending: Internal Medicine | Admitting: Internal Medicine

## 2012-06-26 VITALS — BP 105/67 | HR 96 | Ht 62.0 in | Wt 270.1 lb

## 2012-06-26 DIAGNOSIS — E785 Hyperlipidemia, unspecified: Secondary | ICD-10-CM | POA: Insufficient documentation

## 2012-06-26 DIAGNOSIS — F3289 Other specified depressive episodes: Secondary | ICD-10-CM | POA: Insufficient documentation

## 2012-06-26 DIAGNOSIS — K219 Gastro-esophageal reflux disease without esophagitis: Secondary | ICD-10-CM | POA: Insufficient documentation

## 2012-06-26 DIAGNOSIS — I2781 Cor pulmonale (chronic): Secondary | ICD-10-CM

## 2012-06-26 DIAGNOSIS — R05 Cough: Secondary | ICD-10-CM | POA: Insufficient documentation

## 2012-06-26 DIAGNOSIS — I4891 Unspecified atrial fibrillation: Secondary | ICD-10-CM | POA: Insufficient documentation

## 2012-06-26 DIAGNOSIS — I5020 Unspecified systolic (congestive) heart failure: Secondary | ICD-10-CM | POA: Insufficient documentation

## 2012-06-26 DIAGNOSIS — I2789 Other specified pulmonary heart diseases: Secondary | ICD-10-CM | POA: Insufficient documentation

## 2012-06-26 DIAGNOSIS — R059 Cough, unspecified: Secondary | ICD-10-CM | POA: Insufficient documentation

## 2012-06-26 DIAGNOSIS — B009 Herpesviral infection, unspecified: Secondary | ICD-10-CM | POA: Insufficient documentation

## 2012-06-26 DIAGNOSIS — F79 Unspecified intellectual disabilities: Secondary | ICD-10-CM | POA: Insufficient documentation

## 2012-06-26 DIAGNOSIS — I872 Venous insufficiency (chronic) (peripheral): Secondary | ICD-10-CM | POA: Insufficient documentation

## 2012-06-26 DIAGNOSIS — F329 Major depressive disorder, single episode, unspecified: Secondary | ICD-10-CM | POA: Insufficient documentation

## 2012-06-26 DIAGNOSIS — D509 Iron deficiency anemia, unspecified: Secondary | ICD-10-CM | POA: Insufficient documentation

## 2012-06-26 DIAGNOSIS — E119 Type 2 diabetes mellitus without complications: Secondary | ICD-10-CM | POA: Insufficient documentation

## 2012-06-26 DIAGNOSIS — G4733 Obstructive sleep apnea (adult) (pediatric): Secondary | ICD-10-CM | POA: Insufficient documentation

## 2012-06-26 DIAGNOSIS — N189 Chronic kidney disease, unspecified: Secondary | ICD-10-CM | POA: Insufficient documentation

## 2012-06-26 DIAGNOSIS — I279 Pulmonary heart disease, unspecified: Secondary | ICD-10-CM

## 2012-06-26 MED ORDER — TORSEMIDE 20 MG PO TABS: 40.0000 mg | ORAL_TABLET | Freq: Every day | ORAL | Status: DC

## 2012-06-26 NOTE — Assessment & Plan Note (Signed)
She has severe pulmonary HTN with cor pulmonale which is likely due to OHS, OSA and diastolic HF. Currently she is volume overloaded. Will switch lasix to torsemide 40 daily and see her back in 1-2 weeks for reassessment. Reinforced need for daily weights and reviewed use of sliding scale diuretics. She will eventually need right heart cath with shunt run as well as serology including (ANA, ANCA, HIV, Hepatitis panels, [place]-70) and possible V/Q which we will schedule once she is euvolemic.I stressed to her the need to be compliant with her CPAP and need to lose weight.

## 2012-06-26 NOTE — Progress Notes (Signed)
[name], female   DOB: 10/06/1960, 52 y.o.   MRN: [id number]   Referring Physician: Dr. Ginette Otto Primary Care: Dr. Ginette Otto Primary Cardiologist: none  Weight Range: Baseline proBNP:  HPI:  FaithImanni A Barker is a 52 y.o. female with history of morbid obesity, mental retardation, type- 2 diabetes mellitus, diastolic HF, cor pulmonale with severe pulm artery HTN, chronic venous stasis disease, CRI (1.5)  obstructive sleep apnea on CPAP. Referred by Dr. Ginette Otto for further management of Johnson and HF.  Has struggled with swelling for a long time. But ells me she was diagnosed with HF in 4/14. In looking over the notes, she was admitted to the hospital in 2/14 with volume overload. Echo at the time showed severe cor pulmonale.   - Left ventricle: The cavity size was normal. Wall thickness was increased in a pattern of mild LVH. Systolic function was normal. The estimated ejection fraction was in the range of 55% to 60%. Wall motion was normal; there were no regional wall motion abnormalities. Septal wall flattening consistent with elevated right ventricular pressure/volume. Doppler parameters are consistent with abnormal left ventricular relaxation (grade 1 diastolic dysfunction). - Left atrium: The atrium was mildly dilated. - Right ventricle: The cavity size was severely dilated. Systolic function was severely reduced. - Right atrium: The atrium was moderately dilated. - Pulmonary arteries: Systolic pressure was severely increased. PA peak pressure: 59m Hg (S). - Pericardium, extracardiac: A moderate, free-flowing pericardial effusion was identified posterior to the heart and along the left ventricular free wall. There was no evidence of hemodynamic compromise.  Since that time has been following with Wound Care team for lower extremity wounds. On admit to hospital weight was 297. Got down to 255. But now back to 270. Lasix has been increased 40/20. Leg swelling much  improved. But still with ab bloating. Walks with a cane. Dyspneic with minimal exertion. Occasional episodes of presyncope. Has never had a cath. Had PFTs and LE u/s last week. PFT results not available. No evidence of DVT.   Had scale at home. Weighs every morning. Doesn't use sliding scale lasix.     Review of Systems: [y] = yes, _0  = no   General: Weight gain _1 ; Weight loss _2 ; Anorexia _3 ; Fatigue _4 ; Fever _5 ; Chills _6 ; Weakness _7   Cardiac: Chest pain/pressure _8 ; Resting SOB _9 ; Exertional SOB _10 ; Orthopnea _11 ; Pedal Edema _12 ; Palpitations _13 ; Syncope _14 ; Presyncope _15 ; Paroxysmal nocturnal dyspnea_16   Pulmonary: Cough _17 ; Wheezing_18 ; Hemoptysis_19 ; Sputum _20 ; Snoring _21   GI: Vomiting_22 ; Dysphagia_23 ; Melena_24 ; Hematochezia _25 ; Heartburn_26 ; Abdominal pain _27 ; Constipation _28 ; Diarrhea _29 ; BRBPR _30   GU: Hematuria_31 ; Dysuria _32 ; Nocturia_33   Vascular: Pain in legs with walking _34 ; Pain in feet with lying flat _35 ; Non-healing sores _36 ; Stroke _37 ; TIA _38 ; Slurred speech _39 ;  Neuro: Headaches_40 ; Vertigo_41 ; Seizures_42 ; Paresthesias_43 ;Blurred vision _44 ; Diplopia _45 ; Vision changes _46   Ortho/Skin: Arthritis _47 ; Joint pain _48 ; Muscle pain _49 ; Joint swelling _50 ; Back Pain _51 ; Rash _52   Psych: Depression_53 ; Anxiety_54   Heme: Bleeding problems _55 ; Clotting disorders _56 ; Anemia _57   Endocrine: Diabetes _58 ; Thyroid dysfunction_59    Past Medical History  Diagnosis Date  .  OSA (obstructive sleep apnea)     CPAP  . Venous stasis ulcer     chornic, ?followed up at wound care center, multiple courses of antibiotics in past for cellulitis, on lasix  . GERD (gastroesophageal reflux disease)   . Anemia, iron deficiency     secondary to menhorrhagia, on oral iron, also b12 def, getting monthly b12 shots  . Chronic cough     secondary to alleriges and post nasal drip  . Depression   . Diabetes mellitus   . Hyperlipidemia   . H/O mental  retardation   . Herpes     Current Outpatient Prescriptions  Medication Sig Dispense Refill  . albuterol (PROVENTIL HFA;VENTOLIN HFA) 108 (90 BASE) MCG/ACT inhaler Inhale 2 puffs into the lungs every 6 (six) hours as needed for wheezing.  1 Inhaler  2  . fluticasone (FLONASE) 50 MCG/ACT nasal spray Place 2 sprays into the nose daily.  16 g  3  . furosemide (LASIX) 40 MG tablet Take 80 mg by mouth 2 (two) times daily.      Marland Kitchen ibuprofen (ADVIL,MOTRIN) 200 MG tablet Take 2 tablets (400 mg total) by mouth every 8 (eight) hours as needed. pain  30 tablet    . loratadine (CLARITIN) 10 MG tablet Take 1 tablet (10 mg total) by mouth daily as needed for allergies.  30 tablet  2  . losartan (COZAAR) 25 MG tablet Take 1 tablet (25 mg total) by mouth daily.  30 tablet  11  . metFORMIN (GLUCOPHAGE) 1000 MG tablet Take 1,000 mg by mouth 2 (two) times daily with a meal.      . pantoprazole (PROTONIX) 20 MG tablet Take 1 tablet (20 mg total) by mouth 2 (two) times daily.  180 tablet  4  . pravastatin (PRAVACHOL) 20 MG tablet Take 20 mg by mouth every evening.      . vitamin B-12 (CYANOCOBALAMIN) 250 MCG tablet Take 250 mcg by mouth daily.      . pravastatin (PRAVACHOL) 20 MG tablet TAKE ONE TABLET BY MOUTH EVERY DAY  30 tablet  0   No current facility-administered medications for this encounter.    Allergies  Allergen Reactions  . Aspirin     REACTION: airway swelling  . Codeine     REACTION: tingling in lips and hard breathing - had reaction at dentist - states "I can't take certain kinds of codeine" - happened maybe 10 yr ago  . Lisinopril     Cough  . Sulfonamide Derivatives     REACTION: airway swelling    History   Social History  . Marital Status: Single    Spouse Name: N/A    Number of Children: N/A  . Years of Education: N/A   Occupational History  . Not on file.   Social History Main Topics  . Smoking status: Never Smoker   . Smokeless tobacco: Never Used  . Alcohol Use: No  .  Drug Use: No  . Sexually Active: No   Other Topics Concern  . Not on file   Social History Narrative  . No narrative on file    Family History  Problem Relation Age of Onset  . Mental illness Sister   . Mental retardation Brother     PHYSICAL EXAM: Filed Vitals:   06/26/12 1131  BP: 105/67  Pulse: 96  Height: _0  (1.575 m)  Weight: 270 lb 1.9 oz (122.526 kg)  SpO2: 92%   I walked her round clinic with  pulse ox  sats 97%->93%  Very dyspneic. Gets tachycardic with minimal activity General:  Morbidly obese. Dyspneic with talking HEENT: normal Neck: supple. Thick hard to see JVD. Carotids 2+ bilat; no bruits. No lymphadenopathy or thryomegaly appreciated. Cor: PMI nonpalpable. +RV lift.. Regular rate & rhythm. 2/6 TR + R s3 Lungs: clear Abdomen: obese soft, nontender, nondistended. No bruits or masses. Good bowel sounds. Extremities: no cyanosis, clubbing, rash. Wrapped with UNNA boots 2+ edema  Diffuse excoriations  Neuro: alert & oriented x 3, cranial nerves grossly intact. moves all 4 extremities w/o difficulty. Affect pleasant.     ASSESSMENT & PLAN:

## 2012-06-26 NOTE — Progress Notes (Signed)
Pt ambulated around clinic, O2 sat dropped to 93% on room air

## 2012-06-26 NOTE — Assessment & Plan Note (Signed)
See cor pulmonale section.

## 2012-06-26 NOTE — Patient Instructions (Addendum)
Stop Furosemide (Lasix) Start Torsemide (Demadex) 40 mg (2 tabs) daily  You have been referred to Dr Halford Chessman at [facility]  Your physician recommends that you schedule a follow-up appointment in: 2 weeks

## 2012-06-28 ENCOUNTER — Encounter: Payer: Self-pay | Admitting: Internal Medicine

## 2012-06-28 ENCOUNTER — Ambulatory Visit (INDEPENDENT_AMBULATORY_CARE_PROVIDER_SITE_OTHER): Payer: Medicaid Other | Admitting: Internal Medicine

## 2012-06-28 VITALS — BP 102/68 | HR 101 | Temp 96.5°F | Wt 274.6 lb

## 2012-06-28 DIAGNOSIS — B372 Candidiasis of skin and nail: Secondary | ICD-10-CM | POA: Insufficient documentation

## 2012-06-28 DIAGNOSIS — I50810 Right heart failure, unspecified: Secondary | ICD-10-CM

## 2012-06-28 DIAGNOSIS — J984 Other disorders of lung: Secondary | ICD-10-CM | POA: Insufficient documentation

## 2012-06-28 DIAGNOSIS — E1159 Type 2 diabetes mellitus with other circulatory complications: Secondary | ICD-10-CM

## 2012-06-28 DIAGNOSIS — I509 Heart failure, unspecified: Secondary | ICD-10-CM

## 2012-06-28 LAB — GLUCOSE, CAPILLARY: Glucose-Capillary: 104 mg/dL — ABNORMAL HIGH (ref 70–99)

## 2012-06-28 MED ORDER — NYSTATIN 100000 UNIT/GM EX CREA: TOPICAL_CREAM | Freq: Two times a day (BID) | CUTANEOUS | Status: DC

## 2012-06-28 MED ORDER — PRAVASTATIN SODIUM 20 MG PO TABS: ORAL_TABLET | ORAL | Status: DC

## 2012-06-28 NOTE — Progress Notes (Signed)
[name], female   DOB: [birth year], 52 y.o.   MRN: [id number]  Subjective:   [name]   DOB: 13-Jan-1960 52 y.o.   MRN: [id number]  HPI: [name] is a 52 y.o. female with history of well-controlled type 2 diabetes mellitus, cor pulmonale with severe pulm artery HTN, chronic venous stasis disease, obstructive sleep apnea, and chronic allergic rhinitis presenting for followup of edema and shortness of breath.  Since our last visit, she has had her PFTs which showed restrictive lung disease (FVC 54% predicted and FEV1 68% predicted w minimal bronchodilator response). Has appt w pulmonology (Dr. Halford Chessman) later in July, although she says they are trying to move up this appt.  Also established w HF clinic. Diuretics changed to torsemide 61m daily. Weight is up 5 lbs today. Per Dr. BClayborne Dananote, plan for dx workup to include RHC, labs (HIV, ANA, ANCA, Scl-70, hepatitis panel) and possible V/Q. She will see them again on July 8th. Today she says she doesn't really feel much different in terms of her dyspnea, but says that she just started torsemide yesterday because her son had to fill it from the pharamacy.    Past Medical History  Diagnosis Date  . OSA (obstructive sleep apnea)     CPAP  . Venous stasis ulcer     chornic, ?followed up at wound care center, multiple courses of antibiotics in past for cellulitis, on lasix  . GERD (gastroesophageal reflux disease)   . Anemia, iron deficiency     secondary to menhorrhagia, on oral iron, also b12 def, getting monthly b12 shots  . Chronic cough     secondary to alleriges and post nasal drip  . Depression   . Diabetes mellitus     well controlled on metformin  . Hyperlipidemia   . H/O mental retardation   . Herpes   . Cor pulmonale     PA Peak pressure 875mg  . Diastolic heart failure   . Restrictive lung disease     PFTs 06/2012 (FVC 54% predicted and FEV1 68% predicted w minimal bronchodilator  response).   Current Outpatient Prescriptions  Medication Sig Dispense Refill  . albuterol (PROVENTIL HFA;VENTOLIN HFA) 108 (90 BASE) MCG/ACT inhaler Inhale 2 puffs into the lungs every 6 (six) hours as needed for wheezing.  1 Inhaler  2  . fluticasone (FLONASE) 50 MCG/ACT nasal spray Place 2 sprays into the nose daily.  16 g  3  . ibuprofen (ADVIL,MOTRIN) 200 MG tablet Take 2 tablets (400 mg total) by mouth every 8 (eight) hours as needed. pain  30 tablet    . loratadine (CLARITIN) 10 MG tablet Take 1 tablet (10 mg total) by mouth daily as needed for allergies.  30 tablet  2  . losartan (COZAAR) 25 MG tablet Take 1 tablet (25 mg total) by mouth daily.  30 tablet  11  . metFORMIN (GLUCOPHAGE) 1000 MG tablet Take 1,000 mg by mouth 2 (two) times daily with a meal.      . nystatin cream (MYCOSTATIN) Apply topically 2 (two) times daily.  30 g  0  . pantoprazole (PROTONIX) 20 MG tablet Take 1 tablet (20 mg total) by mouth 2 (two) times daily.  180 tablet  4  . pravastatin (PRAVACHOL) 20 MG tablet Take 20 mg by mouth every evening.      . pravastatin (PRAVACHOL) 20 MG tablet TAKE ONE TABLET BY MOUTH EVERY DAY  30 tablet  0  .  torsemide (DEMADEX) 20 MG tablet Take 2 tablets (40 mg total) by mouth daily.  60 tablet  3  . vitamin B-12 (CYANOCOBALAMIN) 250 MCG tablet Take 250 mcg by mouth daily.       No current facility-administered medications for this visit.   Family History  Problem Relation Age of Onset  . Mental illness Sister   . Mental retardation Brother    History   Social History  . Marital Status: Single    Spouse Name: N/A    Number of Children: N/A  . Years of Education: N/A   Social History Main Topics  . Smoking status: Never Smoker   . Smokeless tobacco: Never Used  . Alcohol Use: No  . Drug Use: No  . Sexually Active: None   Other Topics Concern  . None   Social History Narrative  . None   Review of Systems: 10 pt ROS performed, pertinent positives and negatives  noted in HPI Objective:  Physical Exam: Filed Vitals:   06/28/12 1359  BP: 102/68  Pulse: 101  Temp: 96.5 F (35.8 C)  TempSrc: Oral  Weight: 274 lb 9.6 oz (124.558 kg)  SpO2: 95%   Constitutional: Vital signs reviewed. Patient is an overweight female in no acute distress and cooperative with exam. Alert and oriented x3.  Head: Normocephalic and atraumatic  Eyes: PERRL, EOMI, conjunctivae normal, No scleral icterus.  Neck: Difficult to appreciate any JVD with plethoric neck  Cardiovascular: RRR, S1 normal, S2 normal, no MRG, 2+ radial pulses  Pulmonary/Chest: clear with no rales  Abdominal: erythema/skin breakdown in intertriginous folds of pannus Skin: Bandaged to the knees.  Psychiatric: Speech is slow and loud, patient has mild cognitive impairment  Assessment & Plan:   Please see problem-based charting for assessment and plan.

## 2012-06-28 NOTE — Assessment & Plan Note (Signed)
Contributing to above problem. Workup as per above, has appt w pulmonology in 1 month, possibly sooner if they can arrange.

## 2012-06-28 NOTE — Assessment & Plan Note (Signed)
Under pannus. Pt has nystatin powder, would like to try cream. -Rx for nystatin cream sent to pharmacy.

## 2012-06-28 NOTE — Patient Instructions (Addendum)
1. Keep taking your torsemide. I will call you about your labs tomorrow and we can make changes if we need to to your meds.  2. Please keep your appointments with the heart and lung doctors.  3. I have sent in a cream to the pharmacy for you to use under your belly.

## 2012-06-28 NOTE — Progress Notes (Signed)
Case discussed with Dr. Sandy Salaam at the time of the visit.  We reviewed the resident's history and exam and pertinent patient test results.  I agree with the assessment, diagnosis, and plan of care documented in the resident's note.

## 2012-06-28 NOTE — Assessment & Plan Note (Addendum)
Cor pulmonale. Pt has known OSA, likley OHS, and PFTs show moderate restrictive lung dz.  Per HF clinic note, plan to pursue w/u w RHC. Weight is up 5lbs from HF clinic visit. Otherwise sx are stable, no evidence of acute decompensated HF today.  - Will order labs (HIV, ANA, ANCA, Scl-70, hepatitis panel) desired by HF clinic - Continue torsemide 40 daily, check Bmet today, adjust as needed; expect will need uptitration given weight gain, i will wait on renal func - F/U w cardiology 7/8, pulmonology later in the month.   ADDENDUM 06/29/2012 4:43 PM  BMET    Component Value Date/Time   NA 141 06/28/2012 1455   K 4.0 06/28/2012 1455   CL 107 06/28/2012 1455   CO2 20 06/28/2012 1455   GLUCOSE 93 06/28/2012 1455   BUN 60* 06/28/2012 1455   CREATININE 1.67* 06/28/2012 1455   CREATININE 0.81 02/15/2012 0425   CALCIUM 8.7 06/28/2012 1455   GFRNONAA 83* 02/15/2012 0425   GFRAA >90 02/15/2012 0425    Renal function is worsening, in setting of 5 pound weight gain over 2 days. When I had seen patient, she had just taken her second dose of torsemide, so she may have yet to see the benefit of increased diuresis. I was concerned about her worsening renal function in the setting of increasing diuresis and preload dependent RHF. She previously had normal kidney function until this month when we began more aggressively diuresing her with Lasix. Her weight has also continued to increase. I called Dr. Clayborne Dana heart failure clinic to discuss Ms. Mccranie. I spoke with NP Amy about the case. Recommendation is to continue torsemide, and in fact she may need up titration of diuresis due to significant volume overload. Today I have placed an urgent referral to home health for a CHF nurse to come out to the home.

## 2012-06-29 ENCOUNTER — Encounter: Payer: Self-pay | Admitting: Internal Medicine

## 2012-06-29 DIAGNOSIS — R768 Other specified abnormal immunological findings in serum: Secondary | ICD-10-CM | POA: Insufficient documentation

## 2012-06-29 LAB — HEPATITIS C ANTIBODY: HCV Ab: NEGATIVE

## 2012-06-29 LAB — BASIC METABOLIC PANEL WITH GFR
Calcium: 8.7 mg/dL (ref 8.4–10.5)
GFR, Est African American: 41 mL/min — ABNORMAL LOW
GFR, Est Non African American: 35 mL/min — ABNORMAL LOW
Glucose, Bld: 93 mg/dL (ref 70–99)
Potassium: 4 mEq/L (ref 3.5–5.3)
Sodium: 141 mEq/L (ref 135–145)

## 2012-06-29 LAB — ANCA SCREEN W REFLEX TITER: Atypical p-ANCA Screen: NEGATIVE

## 2012-06-29 LAB — HEPATITIS B SURFACE ANTIBODY,QUALITATIVE: Hep B S Ab: NONREACTIVE

## 2012-06-29 LAB — HEPATITIS B SURFACE ANTIGEN: Hepatitis B Surface Ag: NEGATIVE

## 2012-06-29 LAB — ANA: Anti Nuclear Antibody(ANA): NEGATIVE

## 2012-06-29 LAB — HEPATITIS B CORE ANTIBODY, TOTAL: Hep B Core Total Ab: POSITIVE — AB

## 2012-06-29 NOTE — Addendum Note (Signed)
Addended by: Trinda Pascal on: 06/29/2012 04:49 PM   Modules accepted: Orders

## 2012-06-30 ENCOUNTER — Ambulatory Visit (INDEPENDENT_AMBULATORY_CARE_PROVIDER_SITE_OTHER): Payer: Medicaid Other | Admitting: Internal Medicine

## 2012-06-30 ENCOUNTER — Ambulatory Visit (HOSPITAL_COMMUNITY)
Admission: RE | Admit: 2012-06-30 | Discharge: 2012-06-30 | Disposition: A | Discharge status: Home / Self Care | Payer: Medicaid Other | Source: Ambulatory Visit | Attending: Internal Medicine | Admitting: Internal Medicine

## 2012-06-30 ENCOUNTER — Encounter (HOSPITAL_COMMUNITY): Payer: Self-pay | Admitting: *Deleted

## 2012-06-30 ENCOUNTER — Encounter: Payer: Self-pay | Admitting: Internal Medicine

## 2012-06-30 ENCOUNTER — Encounter: Payer: Self-pay | Admitting: Licensed Clinical Social Worker

## 2012-06-30 ENCOUNTER — Other Ambulatory Visit: Payer: Self-pay

## 2012-06-30 ENCOUNTER — Other Ambulatory Visit: Payer: Self-pay | Admitting: Internal Medicine

## 2012-06-30 ENCOUNTER — Inpatient Hospital Stay (HOSPITAL_COMMUNITY)
Admission: AD | Admit: 2012-06-30 | Discharge: 2012-07-07 | DRG: 291 | Disposition: A | Discharge status: Home / Self Care | Payer: Medicaid Other | Source: Ambulatory Visit | Attending: Internal Medicine | Admitting: Internal Medicine

## 2012-06-30 VITALS — BP 100/64 | HR 97 | Temp 97.0°F | Resp 22 | Ht 62.0 in | Wt 276.0 lb

## 2012-06-30 DIAGNOSIS — I2781 Cor pulmonale (chronic): Secondary | ICD-10-CM | POA: Diagnosis present

## 2012-06-30 DIAGNOSIS — E785 Hyperlipidemia, unspecified: Secondary | ICD-10-CM

## 2012-06-30 DIAGNOSIS — E119 Type 2 diabetes mellitus without complications: Secondary | ICD-10-CM | POA: Diagnosis present

## 2012-06-30 DIAGNOSIS — G4733 Obstructive sleep apnea (adult) (pediatric): Secondary | ICD-10-CM | POA: Diagnosis present

## 2012-06-30 DIAGNOSIS — L97509 Non-pressure chronic ulcer of other part of unspecified foot with unspecified severity: Secondary | ICD-10-CM | POA: Diagnosis present

## 2012-06-30 DIAGNOSIS — K219 Gastro-esophageal reflux disease without esophagitis: Secondary | ICD-10-CM

## 2012-06-30 DIAGNOSIS — L0390 Cellulitis, unspecified: Secondary | ICD-10-CM

## 2012-06-30 DIAGNOSIS — IMO0002 Reserved for concepts with insufficient information to code with codable children: Secondary | ICD-10-CM

## 2012-06-30 DIAGNOSIS — I509 Heart failure, unspecified: Secondary | ICD-10-CM | POA: Diagnosis present

## 2012-06-30 DIAGNOSIS — N179 Acute kidney failure, unspecified: Secondary | ICD-10-CM | POA: Insufficient documentation

## 2012-06-30 DIAGNOSIS — Z6841 Body Mass Index (BMI) 40.0 and over, adult: Secondary | ICD-10-CM

## 2012-06-30 DIAGNOSIS — I50810 Right heart failure, unspecified: Secondary | ICD-10-CM

## 2012-06-30 DIAGNOSIS — J9620 Acute and chronic respiratory failure, unspecified whether with hypoxia or hypercapnia: Secondary | ICD-10-CM | POA: Diagnosis present

## 2012-06-30 DIAGNOSIS — I319 Disease of pericardium, unspecified: Secondary | ICD-10-CM | POA: Diagnosis present

## 2012-06-30 DIAGNOSIS — I5033 Acute on chronic diastolic (congestive) heart failure: Principal | ICD-10-CM | POA: Diagnosis present

## 2012-06-30 DIAGNOSIS — I872 Venous insufficiency (chronic) (peripheral): Secondary | ICD-10-CM | POA: Diagnosis present

## 2012-06-30 DIAGNOSIS — D518 Other vitamin B12 deficiency anemias: Secondary | ICD-10-CM

## 2012-06-30 DIAGNOSIS — E669 Obesity, unspecified: Secondary | ICD-10-CM

## 2012-06-30 DIAGNOSIS — F79 Unspecified intellectual disabilities: Secondary | ICD-10-CM | POA: Diagnosis present

## 2012-06-30 DIAGNOSIS — R768 Other specified abnormal immunological findings in serum: Secondary | ICD-10-CM

## 2012-06-30 DIAGNOSIS — R894 Abnormal immunological findings in specimens from other organs, systems and tissues: Secondary | ICD-10-CM

## 2012-06-30 DIAGNOSIS — E876 Hypokalemia: Secondary | ICD-10-CM | POA: Diagnosis present

## 2012-06-30 DIAGNOSIS — J984 Other disorders of lung: Secondary | ICD-10-CM | POA: Diagnosis present

## 2012-06-30 DIAGNOSIS — E1169 Type 2 diabetes mellitus with other specified complication: Secondary | ICD-10-CM | POA: Diagnosis present

## 2012-06-30 DIAGNOSIS — N189 Chronic kidney disease, unspecified: Secondary | ICD-10-CM | POA: Diagnosis present

## 2012-06-30 DIAGNOSIS — E1149 Type 2 diabetes mellitus with other diabetic neurological complication: Secondary | ICD-10-CM | POA: Diagnosis present

## 2012-06-30 DIAGNOSIS — I2789 Other specified pulmonary heart diseases: Secondary | ICD-10-CM | POA: Diagnosis present

## 2012-06-30 DIAGNOSIS — I130 Hypertensive heart and chronic kidney disease with heart failure and stage 1 through stage 4 chronic kidney disease, or unspecified chronic kidney disease: Secondary | ICD-10-CM | POA: Diagnosis present

## 2012-06-30 DIAGNOSIS — R05 Cough: Secondary | ICD-10-CM

## 2012-06-30 DIAGNOSIS — I83009 Varicose veins of unspecified lower extremity with ulcer of unspecified site: Secondary | ICD-10-CM | POA: Diagnosis present

## 2012-06-30 DIAGNOSIS — R635 Abnormal weight gain: Secondary | ICD-10-CM | POA: Diagnosis present

## 2012-06-30 LAB — COMPLETE METABOLIC PANEL WITH GFR
ALT: 27 U/L (ref 0–35)
AST: 38 U/L — ABNORMAL HIGH (ref 0–37)
Albumin: 3.2 g/dL — ABNORMAL LOW (ref 3.5–5.2)
Alkaline Phosphatase: 140 U/L — ABNORMAL HIGH (ref 39–117)
BUN: 67 mg/dL — ABNORMAL HIGH (ref 6–23)
CO2: 20 mEq/L (ref 19–32)
Calcium: 8.7 mg/dL (ref 8.4–10.5)
Creat: 1.85 mg/dL — ABNORMAL HIGH (ref 0.50–1.10)
Potassium: 4 mEq/L (ref 3.5–5.3)

## 2012-06-30 LAB — BLOOD GAS, ARTERIAL
Acid-base deficit: 5.5 mmol/L — ABNORMAL HIGH (ref 0.0–2.0)
Drawn by: 270211
O2 Content: 2 L/min
Patient temperature: 98.6
pCO2 arterial: 26.4 mmHg — ABNORMAL LOW (ref 35.0–45.0)
pH, Arterial: 7.445 (ref 7.350–7.450)

## 2012-06-30 LAB — GLUCOSE, CAPILLARY: Glucose-Capillary: 105 mg/dL — ABNORMAL HIGH (ref 70–99)

## 2012-06-30 LAB — URINALYSIS, ROUTINE W REFLEX MICROSCOPIC
Bilirubin Urine: NEGATIVE
Glucose, UA: NEGATIVE mg/dL
Hgb urine dipstick: NEGATIVE
Specific Gravity, Urine: 1.009 (ref 1.005–1.030)
pH: 5.5 (ref 5.0–8.0)

## 2012-06-30 LAB — CBC WITH DIFFERENTIAL/PLATELET
Eosinophils Absolute: 0.2 10*3/uL (ref 0.0–0.7)
Eosinophils Relative: 4 % (ref 0–5)
HCT: 38.7 % (ref 36.0–46.0)
Lymphocytes Relative: 35 % (ref 12–46)
Lymphs Abs: 2.1 10*3/uL (ref 0.7–4.0)
MCH: 25.5 pg — ABNORMAL LOW (ref 26.0–34.0)
MCHC: 32.6 g/dL (ref 30.0–36.0)
MCV: 78.2 fL (ref 78.0–100.0)
Monocytes Absolute: 0.7 10*3/uL (ref 0.1–1.0)
Neutrophils Relative %: 50 % (ref 43–77)
Platelets: 235 10*3/uL (ref 150–400)
RBC: 4.95 MIL/uL (ref 3.87–5.11)
WBC: 6.1 10*3/uL (ref 4.0–10.5)

## 2012-06-30 LAB — CREATININE, URINE, RANDOM: Creatinine, Urine: 10.64 mg/dL

## 2012-06-30 MED ORDER — FLUTICASONE PROPIONATE 50 MCG/ACT NA SUSP
2.0000 | Freq: Every day | NASAL | Status: DC
  Administered 2012-06-30 – 2012-07-07 (×8): 2 via NASAL
  Filled 2012-06-30: qty 16

## 2012-06-30 MED ORDER — PANTOPRAZOLE SODIUM 20 MG PO TBEC
20.0000 mg | DELAYED_RELEASE_TABLET | Freq: Two times a day (BID) | ORAL | Status: DC
  Administered 2012-06-30 – 2012-07-07 (×15): 20 mg via ORAL
  Filled 2012-06-30 (×17): qty 1

## 2012-06-30 MED ORDER — ACETAMINOPHEN 650 MG RE SUPP: 650.0000 mg | Freq: Four times a day (QID) | RECTAL | Status: DC | PRN

## 2012-06-30 MED ORDER — HEPARIN SODIUM (PORCINE) 5000 UNIT/ML IJ SOLN
5000.0000 [IU] | Freq: Three times a day (TID) | INTRAMUSCULAR | Status: DC
  Administered 2012-06-30 – 2012-07-07 (×21): 5000 [IU] via SUBCUTANEOUS
  Filled 2012-06-30 (×24): qty 1

## 2012-06-30 MED ORDER — INSULIN ASPART 100 UNIT/ML ~~LOC~~ SOLN
0.0000 [IU] | Freq: Three times a day (TID) | SUBCUTANEOUS | Status: DC
  Administered 2012-07-02: 1 [IU] via SUBCUTANEOUS
  Administered 2012-07-02: 2 [IU] via SUBCUTANEOUS
  Administered 2012-07-05: 1 [IU] via SUBCUTANEOUS
  Administered 2012-07-06: 2 [IU] via SUBCUTANEOUS
  Administered 2012-07-07: 1 [IU] via SUBCUTANEOUS

## 2012-06-30 MED ORDER — FUROSEMIDE 10 MG/ML IJ SOLN
10.0000 mg/h | INTRAVENOUS | Status: DC
  Administered 2012-06-30 – 2012-07-01 (×2): 15 mg/h via INTRAVENOUS
  Administered 2012-07-02: 5 mg/h via INTRAVENOUS
  Administered 2012-07-02 – 2012-07-04 (×2): 10 mg/h via INTRAVENOUS
  Filled 2012-06-30 (×7): qty 25

## 2012-06-30 MED ORDER — ACETAMINOPHEN 325 MG PO TABS
650.0000 mg | ORAL_TABLET | Freq: Four times a day (QID) | ORAL | Status: DC | PRN
  Administered 2012-06-30 – 2012-07-07 (×4): 650 mg via ORAL
  Filled 2012-06-30 (×3): qty 2
  Filled 2012-06-30: qty 1
  Filled 2012-06-30: qty 2

## 2012-06-30 MED ORDER — SIMVASTATIN 10 MG PO TABS
10.0000 mg | ORAL_TABLET | Freq: Every day | ORAL | Status: DC
  Administered 2012-06-30 – 2012-07-06 (×7): 10 mg via ORAL
  Filled 2012-06-30 (×8): qty 1

## 2012-06-30 MED ORDER — LOSARTAN POTASSIUM 25 MG PO TABS
25.0000 mg | ORAL_TABLET | Freq: Every day | ORAL | Status: DC
  Administered 2012-06-30: 25 mg via ORAL
  Filled 2012-06-30: qty 1

## 2012-06-30 MED ORDER — FUROSEMIDE 10 MG/ML IJ SOLN
80.0000 mg | Freq: Once | INTRAMUSCULAR | Status: AC
  Administered 2012-06-30: 80 mg via INTRAVENOUS
  Filled 2012-06-30: qty 8

## 2012-06-30 MED ORDER — CYANOCOBALAMIN 250 MCG PO TABS
250.0000 ug | ORAL_TABLET | Freq: Every day | ORAL | Status: DC
  Administered 2012-06-30 – 2012-07-07 (×8): 250 ug via ORAL
  Filled 2012-06-30 (×8): qty 1

## 2012-06-30 MED ORDER — VITAMIN B-12 250 MCG PO TABS: 250.0000 ug | ORAL_TABLET | Freq: Every day | ORAL | Status: DC

## 2012-06-30 MED ORDER — POTASSIUM CHLORIDE CRYS ER 20 MEQ PO TBCR
20.0000 meq | EXTENDED_RELEASE_TABLET | Freq: Two times a day (BID) | ORAL | Status: DC
  Administered 2012-06-30 – 2012-07-01 (×2): 20 meq via ORAL
  Filled 2012-06-30 (×3): qty 1

## 2012-06-30 MED ORDER — ALBUTEROL SULFATE HFA 108 (90 BASE) MCG/ACT IN AERS: 2.0000 | INHALATION_SPRAY | Freq: Four times a day (QID) | RESPIRATORY_TRACT | Status: DC | PRN

## 2012-06-30 NOTE — Progress Notes (Signed)
[facility]  Patient Status: New  [facility] is providing the following services: RN - referral from MD office   If patient discharges after hours, please call [phone].   Pearletha Forge 06/30/2012, 2:33 PM

## 2012-06-30 NOTE — Assessment & Plan Note (Signed)
She has low risk factors for hep B. Last intercourse greater than 20 years ago, no IV drug use, tattoos. No nausea or abdominal pain. Based on her risk category, this is most likely a false positive given her negative hep B surface antigen and surface antibody. We'll check a metabolic panel to make sure

## 2012-06-30 NOTE — Progress Notes (Signed)
IV started in rt hand with # 22 angiocath. Saline loc.

## 2012-06-30 NOTE — Addendum Note (Signed)
Addended by: Truddie Crumble on: 06/30/2012 10:48 AM   Modules accepted: Orders

## 2012-06-30 NOTE — Assessment & Plan Note (Signed)
Likely cardiorenal syndrome, patient's weight continues to increase even with up titration of her diuretics, and her creatinine has continued to climb over the past month. - hold metformin, ibuprofen, losartan

## 2012-06-30 NOTE — H&P (Signed)
Hospital Admission Note Date: 06/30/2012  Patient name: [name] Medical record number: [id number] Date of birth: [birth year] Age: 52 y.o. Gender: female PCP: Tonia Brooms, MD  Medical Service: Internal Medicine Teaching Service   Attending physician:  Dr Dominic Pea    1st Contact: Dr Karlyn Agee              Pager: [phone] 2nd Contact: Dr Ivor Costa      Pager: [phone]  After 5 pm or weekends: 1st Contact:      Pager: [phone] 2nd Contact:      Pager: [phone]  Chief Complaint: Shortness of Breath and Dizziness   History of Present Illness: [name] is a 52 y.o. female with history of well-controlled type 2 diabetes mellitus (Hgb A1c 6.0) , cor pulmonale with severe pulm artery HTN (PA peak pressure 89 mm Hg per 2 D echo 02/13/12) , chronic venous stasis disease with venous stasis ulcers followed by wound care center, obstructive sleep apnea and  restrictive lung disease (FVC 54% predicted and FEV1 68% predicted w minimal bronchodilator response) presented to the clinic with shortness of breath, weight gain and dizziness.   Patient reports her shortness of breath has been increasing getting worse. Currently she is experiencing symptoms even while resting. She's getting very winded even walking from her bedroom to the bathroom, having to stop often to catch her breath.She furthermore is almost sitting upright by sleeping. She denies any chest pain. She endorses gradual weight gain. She reports that she would gain 1/2 -1 pound a day in the last few weeks. Reviewing the chart she's gained 2 pounds over the past 2 days, representing a 7 pound weight gain over the past 4 days. Patient also noted dizziness whenever she is getting up to fast. She does not recall when everything started. Denies any headache, vision changes, numbness, weakness but patient noted that occasionally when she holds things in her left hand she would lose control.  Patient was seen in the clinic 2 days ago. At  that time she recently established care with the heart failure clinic, and her diuretics were escalated from lasix to torsemide 40 mg daily. Since that visit, she had gained 5 pounds. Worsening renal function (creatinine 1.5 -->1.7) was noted on 06/28/12 . At baseline she has normal renal function and has had acute worsening of renal function over the past month as her diuretics have been uptitrated.   A workup for  right-sided heart failure was recommended by the heart failure clinic including labs (HIV, ANA, ANCA, Scl-70, hepatitis panel). Results were notable for a positive hepatitis B core antibody in the setting of negative hepatitis surface antigen and antibody. She's not had any significant nausea or abdominal pain. Patient denies history of sexual intercourse in the past 20 years, no history of IV drug use, no tattoos. One blood transfusion several years ago. Overall, she is low risk for hepatitis.   Meds:    Current Outpatient Prescriptions  Medication Sig Dispense Refill  . albuterol (PROVENTIL HFA;VENTOLIN HFA) 108 (90 BASE) MCG/ACT inhaler Inhale 2 puffs into the lungs every 6 (six) hours as needed for wheezing.  1 Inhaler  2  . fluticasone (FLONASE) 50 MCG/ACT nasal spray Place 2 sprays into the nose daily.  16 g  3  . ibuprofen (ADVIL,MOTRIN) 200 MG tablet Take 2 tablets (400 mg total) by mouth every 8 (eight) hours as needed. pain  30 tablet    . loratadine (CLARITIN) 10 MG tablet  Take 1 tablet (10 mg total) by mouth daily as needed for allergies.  30 tablet  2  . losartan (COZAAR) 25 MG tablet Take 1 tablet (25 mg total) by mouth daily.  30 tablet  11  . metFORMIN (GLUCOPHAGE) 1000 MG tablet Take 1,000 mg by mouth 2 (two) times daily with a meal.      . nystatin cream (MYCOSTATIN) Apply topically 2 (two) times daily.  30 g  0  . pantoprazole (PROTONIX) 20 MG tablet Take 1 tablet (20 mg total) by mouth 2 (two) times daily.  180 tablet  4  . pravastatin (PRAVACHOL) 20 MG tablet TAKE  ONE TABLET BY MOUTH EVERY DAY  30 tablet  5  . torsemide (DEMADEX) 20 MG tablet Take 2 tablets (40 mg total) by mouth daily.  60 tablet  3  . vitamin B-12 (CYANOCOBALAMIN) 250 MCG tablet Take 250 mcg by mouth daily.        Allergies: Allergies as of 06/30/2012 - Review Complete 06/30/2012  Allergen Reaction Noted  . Aspirin  12/09/2005  . Codeine    . Lisinopril  05/02/2012  . Sulfonamide derivatives  10/19/2005   Past Medical History  Diagnosis Date  . OSA (obstructive sleep apnea)     CPAP  . Venous stasis ulcer     chornic, ?followed up at wound care center, multiple courses of antibiotics in past for cellulitis, on lasix  . GERD (gastroesophageal reflux disease)   . Anemia, iron deficiency     secondary to menhorrhagia, on oral iron, also b12 def, getting monthly b12 shots  . Chronic cough     secondary to alleriges and post nasal drip  . Depression   . Diabetes mellitus     well controlled on metformin  . Hyperlipidemia   . H/O mental retardation   . Herpes   . Cor pulmonale     PA Peak pressure 107mHg  . Diastolic heart failure   . Restrictive lung disease     PFTs 06/2012 (FVC 54% predicted and FEV1 68% predicted w minimal bronchodilator response).   Past Surgical History  Procedure Laterality Date  . Cholecystectomy     Family History  Problem Relation Age of Onset  . Mental illness Sister   . Mental retardation Brother    History   Social History  . Marital Status: Single    Spouse Name: N/A    Number of Children: N/A  . Years of Education: N/A   Occupational History  . Not on file.   Social History Main Topics  . Smoking status: Never Smoker   . Smokeless tobacco: Never Used  . Alcohol Use: No  . Drug Use: No  . Sexually Active: Not on file   Other Topics Concern  . Not on file   Social History Narrative  . No narrative on file    Review of Systems: Constitutional: Denies fever, chills, diaphoresis, appetite change and fatigue.  HEENT:  Denies congestion, sore throat, rhinorrhea, sneezing, mouth sores, trouble swallowing,  Respiratory: Noted  SOB, DOE but denies chest tightness,  and wheezing.   Cardiovascular: Denies chest pain, palpitations but noted  leg swelling.  Gastrointestinal: Denies nausea, vomiting, abdominal pain, diarrhea, constipation, blood in stool but noted abdominal distention.  Genitourinary: Denies dysuria, urgency, frequency, hematuria, flank pain and difficulty urinating.  Neurological: Noted dizziness but denies seizures, syncope, weakness,numbness and headaches.   Physical Exam: Constitutional: Vital signs reviewed.  Patient is an obese female in no acute distress  and cooperative with exam. Alert and oriented x3.  Mouth: no erythema or exudates, mucous membranes dry Eyes: PERRL, EOMI, conjunctivae normal, No scleral icterus.  Neck: Supple,  Cardiovascular: RRR, S1 normal, S2 normal, no MRG, pulses symmetric and intact bilaterally Pulmonary/Chest: CTAB, no wheezes, rales, or rhonchi Abdominal: Soft. Non-tender, non-distended, bowel sounds are normal, no masses, organomegaly, or guarding present.  Neurological: A&O x3, Strenght is normal and symmetric bilaterally, cranial nerve II-XII are grossly intact, no focal motor deficit, sensory intact to light touch bilaterally.  Skin: Lower extremities: multiple ulcers present on the anterior shin, right foot: 2nd and 3rd toe, left foot: 3rd toe  Upper extremities: excoriations present along with area of hypopigmentation   Lab results: Basic Metabolic Panel:  Recent Labs  06/28/12 1455  NA 141  K 4.0  CL 107  CO2 20  GLUCOSE 93  BUN 60*  CREATININE 1.67*  CALCIUM 8.7   Liver Function Tests: Lab Results  Component Value Date   ALT 20 02/11/2012   AST 30 02/11/2012   ALKPHOS 86 02/11/2012   BILITOT 1.0 02/11/2012     CBC: Pending   BNP:  CBG:  Recent Labs  06/28/12 1415  GLUCAP 104*   Hemoglobin A1C: Lab Results  Component Value Date    HGBA1C 6.0 05/31/2012    Fasting Lipid Panel: Lab Results  Component Value Date   CHOL 91 03/15/2012   HDL 27* 03/15/2012   LDLCALC 48 03/15/2012   TRIG 78 03/15/2012   CHOLHDL 3.4 03/15/2012   Thyroid Function Tests:   Anemia Panel: Lab Results  Component Value Date   FERRITIN 78 03/03/2011    Lab Results  Component Value Date   VITAMINB12 598 03/03/2011     .  Assessment & Plan by Problem:  #. Acute on chronic right sided CHF likely cause of worsening shortness of breath and weight gain. Other DD for SOB include worsening lung disease. Coronary artery disease unlikely although patient has risk factors including diabetes, hyperlipidemia, CHF. No signs of infection is noted. Highly unlikely PE with a Well score of 0.  - Will admit patient to tele - Consult CHF team for assistance  - Will continue ARB - Strict I &Os and daily weights  - Possible right heart cath per Cardiology  - NO  Aspirin due allergies   #. Dizziness likely due to hypotension in the setting of diuresis. Orthostatic blood pressures were obtained during the office visit which were negative. Neuro exam was within normal limits. - Will continue to monitor  #Acute renal failure most likely due to diuresis.  - Will obtain  Basic Metabolic panel and UA  - If no improvement of renal function may consider further evaluation including renal ultrasound to evaluate for possible obstruction   #. Restrictive lung disease  ( PFT : FVC 54% predicted and FEV1 68% predicted w minimal bronchodilator response) may be contributing to shortness of breath. Currently on no medical therapy.  During clinic visit today Sats resting 97 % and ambulating 93 %. Cardiology recommended a VQ scan . - Consider followup with pulmonology as an outpatient and   # Diabetes Mellitus well-controlled with hemoglobin A1c of 6.  - Hold metformin - Start patient on sliding-scale insulin  #Lower extremities ulcer: Patient is followed up by wound  care as an outpatient. Patient underwent lower extremity venous Doppler on 06/15/12 which showed  right and left lower extremities: Bilateral pulsatile deep system with no evidence of thrombosis or valvular insufficiency.  Right and left G. SV and SSB no venous insufficiency was noted. Cultures were obtained on 05/17/12 which showed a few methicillin resistant staph aureus. Patient was treated with doxycycline. Currently on no antibiotic therapy. - Will consult  wound care    # Anemia- No signs of active bleeding - Monitor CBC   #Sleep apnea:  Will place on Cpap qhs  #HLD: LDL 48 (03/2012) Will continue statin   #GERD- Asymptomatic at this point - Continue PPI  #DVT : Heparin   Dispo: Disposition is deferred at this time, awaiting improvement of current medical problems.   The patient does have a current PCP Tonia Brooms, MD) and does need an [facility] hospital follow-up appointment after discharge.  The patient does not have transportation limitations that hinder transportation to clinic appointments.  SignedRosalia Hammers 06/30/2012, 10:51 AM

## 2012-06-30 NOTE — Assessment & Plan Note (Addendum)
See last assessment plan and from yesterday. Patient appears to be decompensating in terms of her right-sided heart failure. She has now gained 7 pounds over the past 4 days, increasingly dyspneic, and with worsening renal function with escalation of her diuresis. She is complaining of dizziness with up titration of her diuretics, and I'm concerned about escalating these as an outpatient given her preload dependent right-sided heart failure. I do not feel comfortable sending her home over the weekend, as we do not yet have home health CHF monitoring set up, and she is alone at home most of the day while her son is at work. Will admit patient for diuresis, treatment of acute exacerbation of CHF. We'll get heart failure team involved in her care during admission, they have seen her once in outpatient setting. ? RHC or V/Q during admission.  Discussed case with attending Dr. Lynnae January.

## 2012-06-30 NOTE — Progress Notes (Signed)
[name], female   DOB: 1960/09/21, 52 y.o.   MRN: [id number] CSW was to address enrolling in [facility] and [place] during [facility] appt.  However, pt was a direct admit.  CSW requesting [facility] hospital liaison to follow patient for potential enrollment.  CSW notified [facility] of pt's direct admission.

## 2012-06-30 NOTE — Consult Note (Addendum)
Advanced Heart Failure Team Consult Note  Referring Physician: IMTS Primary Cardiologist:  Bedias  Reason for Consultation: PAH  HPI:    [name] is a 52 y.o. 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   We saw her in the HF Clinic for the first time earlier this week. She was admitted to the hospital in 4/14 when she was diagnosed with HF and PAH. Echo as below. On admit to hospital in 4/14 weight was 297. Got down to 255. But in clinic weight was 270 despite recent increases in lasix. Now 271. PH felt to be due to OHS, OSA and diastolic HF. We switched her to torsemide and ordered labs. I walked her round clinic with pulse ox sats 97%->93%    However over past few days weight continues to go up and she is more dyspneic now with Class IV symptoms. Creatine also continues to climb from 1.3-> 1.85. Admitted for management. Of note, she has been taking ibuprofen 600 mg 3-4x/day for diabetic foot ulcer pain as prescribed by Wound Care.  Echo 4/14.  - Left ventricle: The cavity size was normal. Wall thickness was increased in a pattern of mild LVH. Systolic function was normal. The estimated ejection fraction was in the range of 55% to 60%. Wall motion was normal; there were no regional wall motion abnormalities. Septal wall flattening consistent with elevated right ventricular pressure/volume. Doppler parameters are consistent with abnormal left ventricular relaxation (grade 1 diastolic dysfunction). - Left atrium: The atrium was mildly dilated. - Right ventricle: The cavity size was severely dilated. Systolic function was severely reduced. - Right atrium: The atrium was moderately dilated. - Pulmonary arteries: Systolic pressure was severely increased. PA peak pressure: 63m Hg (S). - Pericardium,  extracardiac: A moderate, free-flowing pericardial effusion was identified posterior to the heart and along the left ventricular free wall. There was no evidence of hemodynamic compromise.   PFTs showed restrictive lung disease - FVC 54% predicted and FEV1 68% predicted w minimal bronchodilator response. I can't find DLCO.    Review of Systems: [y] = yes, _0  = no   General: Weight gain [ y]; Weight loss _1 ; Anorexia _2 ; Fatigue [Blue.Reese]; Fever _3 ; Chills _4 ; Weakness _5   Cardiac: Chest pain/pressure _6 ; Resting SOB [Blue.Reese]; Exertional SOB [Blue.Reese]; Orthopnea _7 ; Pedal Edema [ y]; Palpitations _8 ; Syncope _9 ; Presyncope _10 ; Paroxysmal nocturnal dyspnea_11   Pulmonary: Cough [ y]; Wheezing_12 ; Hemoptysis_13 ; Sputum _14 ; Snoring [Blue.Reese]  GI: Vomiting_15 ; Dysphagia_16 ; Melena_17 ; Hematochezia _18 ; Heartburn_19 ; Abdominal pain _20 ; Constipation _21 ; Diarrhea _22 ; BRBPR _23   GU: Hematuria_24 ; Dysuria _25 ; Nocturia_26   Vascular: Pain in legs with walking _27 ; Pain in feet with lying flat [Blue.Reese]; Non-healing sores [ y]; Stroke _28 ; TIA _29 ; Slurred speech _30 ;  Neuro: Headaches_31 ; Vertigo_32 ; Seizures_33 ; Paresthesias_34 ;Blurred vision _35 ; Diplopia _36 ; Vision changes _37   Ortho/Skin: Arthritis _38 ; Joint pain [ y]; Muscle pain _39 ; Joint swelling _40 ; Back Pain _41 ; Rash _42   Psych: Depression_43 ; Anxiety_44   Heme: Bleeding problems _45 ; Clotting disorders _46 ; Anemia _47   Endocrine: Diabetes [ y]; Thyroid dysfunction_48   Home Medications Prior to Admission medications   Medication Sig Start Date End Date Taking? Authorizing Provider  albuterol (PROVENTIL HFA;VENTOLIN HFA) 108 (90 BASE) MCG/ACT inhaler Inhale 2 puffs into the lungs every 6 (six) hours as needed for wheezing. 10/26/11 10/25/12  Awilda Metro, NP  fluticasone (FLONASE) 50 MCG/ACT nasal spray Place 2 sprays into the nose daily. 11/16/11   Tonia Brooms, MD  ibuprofen (ADVIL,MOTRIN) 200 MG tablet Take 2 tablets (400 mg total) by  mouth every 8 (eight) hours as needed. pain 02/15/12   Otho Bellows, MD  loratadine (CLARITIN) 10 MG tablet Take 1 tablet (10 mg total) by mouth daily as needed for allergies. 03/15/12 03/15/13  Tonia Brooms, MD  losartan (COZAAR) 25 MG tablet Take 1 tablet (25 mg total) by mouth daily. 05/02/12 05/02/13  Tonia Brooms, MD  metFORMIN (GLUCOPHAGE) 1000 MG tablet Take 1,000 mg by mouth 2 (two) times daily with a meal. 08/05/11 08/04/12  Karren Cobble, MD  nystatin cream (MYCOSTATIN) Apply topically 2 (two) times daily. 06/28/12   Tonia Brooms, MD  pantoprazole (PROTONIX) 20 MG tablet Take 1 tablet (20 mg total) by mouth 2 (two) times daily. 08/24/11   Tonia Brooms, MD  pravastatin (PRAVACHOL) 20 MG tablet TAKE ONE TABLET BY MOUTH EVERY DAY 06/28/12   Tonia Brooms, MD  torsemide (DEMADEX) 20 MG tablet Take 2 tablets (40 mg total) by mouth daily. 06/26/12   Jolaine Artist, MD  vitamin B-12 (CYANOCOBALAMIN) 250 MCG tablet Take 250 mcg by mouth daily.    Historical Provider, MD    Past Medical History: Past Medical History  Diagnosis Date  . OSA (obstructive sleep apnea)     CPAP  . Venous stasis ulcer     chornic, ?followed up at wound care center, multiple courses of antibiotics in past for cellulitis, on lasix  . GERD (gastroesophageal reflux disease)   . Anemia, iron deficiency     secondary to menhorrhagia, on oral iron, also b12 def, getting monthly b12 shots  . Chronic cough     secondary to alleriges and post nasal drip  . Depression   . Diabetes mellitus     well controlled on metformin  . Hyperlipidemia   . H/O mental retardation   . Herpes   . Cor pulmonale     PA Peak pressure 59mHg  . Diastolic heart failure   . Restrictive lung disease     PFTs 06/2012 (FVC 54% predicted and FEV1 68% predicted w minimal bronchodilator response).    Past Surgical History: Past Surgical History  Procedure Laterality Date  . Cholecystectomy      Family History: Family History   Problem Relation Age of Onset  . Mental illness Sister   . Mental retardation Brother     Social History: History   Social History  . Marital Status: Single    Spouse Name: N/A    Number of Children: N/A  . Years of Education: N/A   Social History Main Topics  . Smoking status: Never Smoker   . Smokeless tobacco: Never Used  . Alcohol Use: No  . Drug Use: No  . Sexually Active: Not on file   Other Topics Concern  . Not on file   Social History Narrative  . No narrative on file    Allergies:  Allergies  Allergen Reactions  . Aspirin     REACTION: airway swelling  . Codeine     REACTION: tingling in lips and hard breathing -  had reaction at dentist - states "I can't take certain kinds of codeine" - happened maybe 10 yr ago  . Lisinopril     Cough  . Sulfonamide Derivatives     REACTION: airway swelling    Objective:    Vital Signs:   Temp:  [97 F (36.1 C)] 97 F (36.1 C) (06/27 1329) Pulse Rate:  [89-97] 89 (06/27 1329) Resp:  [18-22] 18 (06/27 1329) BP: (100-108)/(64-75) 108/75 mmHg (06/27 1329) SpO2:  [90 %-93 %] 90 % (06/27 1329) Weight:  [123.333 kg (271 lb 14.4 oz)-125.193 kg (276 lb)] 123.333 kg (271 lb 14.4 oz) (06/27 1329) Last BM Date: 06/30/12  Weight change: Filed Weights   06/30/12 1329  Weight: 123.333 kg (271 lb 14.4 oz)    Intake/Output:  No intake or output data in the 24 hours ending 06/30/12 1514   Physical Exam: General: Morbidly obese. Dyspneic with talking  HEENT: normal  Neck: supple. Thick hard to see JVD looks way up. Carotids 2+ bilat; no bruits. No lymphadenopathy or thryomegaly appreciated.  Cor: PMI nonpalpable. +RV lift.. Regular rate & rhythm. 2/6 TR + R s3  Lungs: clear  Abdomen: obese soft, nontender, nondistended. No bruits or masses. Good bowel sounds.  Extremities: no cyanosis, clubbing, rash. 3-4+ edema  Diffuse excoriations  Neuro: alert & oriented x 3, cranial nerves grossly intact. moves all 4 extremities  w/o difficulty. Affect pleasant.   Telemetry: SR 97   Labs: Basic Metabolic Panel:  Recent Labs Lab 06/28/12 1455 06/30/12 1048  NA 141 138  K 4.0 4.0  CL 107 103  CO2 20 20  GLUCOSE 93 96  BUN 60* 67*  CREATININE 1.67* 1.85*  CALCIUM 8.7 8.7    Liver Function Tests:  Recent Labs Lab 06/30/12 1048  AST 38*  ALT 27  ALKPHOS 140*  BILITOT 0.8  PROT 7.3  ALBUMIN 3.2*   No results found for this basename: LIPASE, AMYLASE,  in the last 168 hours No results found for this basename: AMMONIA,  in the last 168 hours  CBC:  Recent Labs Lab 06/30/12 1048  WBC 6.1  NEUTROABS 3.0  HGB 12.6  HCT 38.7  MCV 78.2  PLT 235    Cardiac Enzymes: No results found for this basename: CKTOTAL, CKMB, CKMBINDEX, TROPONINI,  in the last 168 hours  BNP: BNP (last 3 results)  Recent Labs  02/11/12 1519 02/14/12 0700  PROBNP 3892.0* 3408.0*    CBG:  Recent Labs Lab 06/28/12 1415  GLUCAP 104*    Coagulation Studies: No results found for this basename: LABPROT, INR,  in the last 72 hours    Imaging: Dg Chest 2 View  06/30/2012   *RADIOLOGY REPORT*  Clinical Data: Shortness of breath, CHF.  CHEST - 2 VIEW  Comparison: 02/12/2012.  Findings: Trachea is midline.  Heart is enlarged, stable.  Lungs appear grossly clear.  No pleural fluid.  Flowing anterior osteophytosis in the thoracic spine.  IMPRESSION: No acute findings.   Original Report Authenticated By: Lorin Picket, M.D.      Medications:     Current Medications: . fluticasone  2 spray Each Nare Daily  . heparin  5,000 Units Subcutaneous Q8H  . insulin aspart  0-9 Units Subcutaneous TID WC  . losartan  25 mg Oral Daily  . pantoprazole  20 mg Oral BID  . simvastatin  10 mg Oral q1800  . vitamin B-12  250 mcg Oral Daily     Infusions:  Assessment:   1) Acute and chronic respiratory failure 2) Cor pulmonale 3) Pulmonary HTN 4) A/C diastolic HF 5) Morbid obesity 6) A/C renal failure 7)  Non-healing wounds 8) Mental retardation 9) Microcytic anemia   Plan/Discussion:    I suspect she has cardiorenal syndrome in the setting of cor pulmonale and diastolic HF. She has marked volume overload. In April she responded well to IV lasix. Will place Foley and start IV lasix gtt. If not responding would add milrinone. Will eventually need RHC but I would to try an optimize her volume status prior to cath, if possible. Given renal dysfunction will stop losartan. Needs to avoid ibuprofen. Check ABG.    Length of Stay: 0  Glori Bickers 06/30/2012, 3:14 PM  Advanced Heart Failure Team Pager [phone] (M-F; 7a - 4p)  Please contact [facility] for night-coverage after hours (4p -7a ) and weekends on [email or web address]

## 2012-06-30 NOTE — Progress Notes (Signed)
[name], female   DOB: [birth year], 52 y.o.   MRN: [id number]  Subjective:   [name]   DOB: [birth year] 52 y.o.   MRN: [id number]  HPI: [name] is a 52 y.o. female with history of well-controlled type 2 diabetes mellitus, cor pulmonale with severe pulm artery HTN, chronic venous stasis disease, obstructive sleep apnea, restrictive lung disease (FVC 54% predicted and FEV1 68% predicted w minimal bronchodilator response) and chronic allergic rhinitis presenting for followup of CHF. Was seen in the clinic 2 days ago. At that time she recently established with a heart failure clinic, and her diuretics were escalated to torsemide 40 mg daily. Since that visit, she had gained 5 pounds. I checked a metabolic panel, which showed worsening renal function (creatinine 1.5 -->1.7). At baseline she has normal renal function and has had acute worsening of renal function over the past month as her diuretics have been uptitrated.  Today she returns for followup visit 2 days later. She's gained 2 pounds over the past 2 days, representing a 7 pound weight gain over the past 4 days. Her dyspnea is worsening. Her son has accompanied her to this visit, and reports that she's getting very winded even walking from her bedroom to the bathroom, having to stop often to catch her breath. Patient reports that with increasing diuretic dosage she is getting dizzier at home. No chest pain, chest pressure.  Also the visit 2 days ago, I initiated a workup for her right-sided heart failure is recommended by the heart failure clinic including labs (HIV, ANA, ANCA, Scl-70, hepatitis panel). Results were notable for a positive hepatitis B core antibody in the setting of negative hepatitis surface antigen and antibody. She's not had any significant nausea or abdominal pain. Patient denies history of sexual intercourse in the past 20 years, no history of IV drug use, no tattoos. One blood  transfusion several years ago. Overall, she is low risk for hepatitis.    Past Medical History  Diagnosis Date  . OSA (obstructive sleep apnea)     CPAP  . Venous stasis ulcer     chornic, ?followed up at wound care center, multiple courses of antibiotics in past for cellulitis, on lasix  . GERD (gastroesophageal reflux disease)   . Anemia, iron deficiency     secondary to menhorrhagia, on oral iron, also b12 def, getting monthly b12 shots  . Chronic cough     secondary to alleriges and post nasal drip  . Depression   . Diabetes mellitus     well controlled on metformin  . Hyperlipidemia   . H/O mental retardation   . Herpes   . Cor pulmonale     PA Peak pressure 68mHg  . Diastolic heart failure   . Restrictive lung disease     PFTs 06/2012 (FVC 54% predicted and FEV1 68% predicted w minimal bronchodilator response).   Current Outpatient Prescriptions  Medication Sig Dispense Refill  . albuterol (PROVENTIL HFA;VENTOLIN HFA) 108 (90 BASE) MCG/ACT inhaler Inhale 2 puffs into the lungs every 6 (six) hours as needed for wheezing.  1 Inhaler  2  . fluticasone (FLONASE) 50 MCG/ACT nasal spray Place 2 sprays into the nose daily.  16 g  3  . ibuprofen (ADVIL,MOTRIN) 200 MG tablet Take 2 tablets (400 mg total) by mouth every 8 (eight) hours as needed. pain  30 tablet    . loratadine (CLARITIN) 10 MG tablet Take 1 tablet (10  mg total) by mouth daily as needed for allergies.  30 tablet  2  . losartan (COZAAR) 25 MG tablet Take 1 tablet (25 mg total) by mouth daily.  30 tablet  11  . metFORMIN (GLUCOPHAGE) 1000 MG tablet Take 1,000 mg by mouth 2 (two) times daily with a meal.      . nystatin cream (MYCOSTATIN) Apply topically 2 (two) times daily.  30 g  0  . pantoprazole (PROTONIX) 20 MG tablet Take 1 tablet (20 mg total) by mouth 2 (two) times daily.  180 tablet  4  . pravastatin (PRAVACHOL) 20 MG tablet TAKE ONE TABLET BY MOUTH EVERY DAY  30 tablet  5  . torsemide (DEMADEX) 20 MG tablet  Take 2 tablets (40 mg total) by mouth daily.  60 tablet  3  . vitamin B-12 (CYANOCOBALAMIN) 250 MCG tablet Take 250 mcg by mouth daily.       No current facility-administered medications for this visit.   Family History  Problem Relation Age of Onset  . Mental illness Sister   . Mental retardation Brother    History   Social History  . Marital Status: Single    Spouse Name: N/A    Number of Children: N/A  . Years of Education: N/A   Social History Main Topics  . Smoking status: Never Smoker   . Smokeless tobacco: Never Used  . Alcohol Use: No  . Drug Use: No  . Sexually Active: None   Other Topics Concern  . None   Social History Narrative  . None   Review of Systems: 10 pt ROS performed, pertinent positives and negatives noted in HPI Objective:  Physical Exam: Filed Vitals:   06/30/12 0930 06/30/12 0951  BP: 100/64   Pulse: 97   Weight: 276 lb (125.193 kg)   SpO2: 93% 93%   Constitutional: Vital signs reviewed. Patient is an overweight female w labored breathing, and cooperative with exam.  Head: Normocephalic and atraumatic  Eyes: PERRL, EOMI, conjunctivae normal, No scleral icterus.  Neck: Difficult to appreciate any JVD with plethoric neck  Cardiovascular: RRR, S1 normal, S2 normal, no MRG, 2+ radial pulses  Pulmonary/Chest: Increased WOB. Lungs are clear with no rales  Abdominal: no TTP. No hepatomegaly/splenomegaly appreciated but abdomen very obese. Skin: Bandaged to the knees.  Psychiatric: Speech is slow and loud, patient has mild cognitive impairment  Assessment & Plan:   Please see problem-based charting for assessment and plan.

## 2012-07-01 LAB — BASIC METABOLIC PANEL
BUN: 51 mg/dL — ABNORMAL HIGH (ref 6–23)
CO2: 25 mEq/L (ref 19–32)
CO2: 27 mEq/L (ref 19–32)
Calcium: 8.8 mg/dL (ref 8.4–10.5)
Calcium: 8.7 mg/dL (ref 8.4–10.5)
Calcium: 8.6 mg/dL (ref 8.4–10.5)
Creatinine, Ser: 1.49 mg/dL — ABNORMAL HIGH (ref 0.50–1.10)
Creatinine, Ser: 1.21 mg/dL — ABNORMAL HIGH (ref 0.50–1.10)
Creatinine, Ser: 1.32 mg/dL — ABNORMAL HIGH (ref 0.50–1.10)
GFR calc non Af Amer: 40 mL/min — ABNORMAL LOW (ref >90)
GFR calc non Af Amer: 46 mL/min — ABNORMAL LOW (ref >90)
Glucose, Bld: 99 mg/dL (ref 70–99)
Glucose, Bld: 118 mg/dL — ABNORMAL HIGH (ref 70–99)
Sodium: 140 mEq/L (ref 135–145)
Sodium: 138 mEq/L (ref 135–145)

## 2012-07-01 LAB — GLUCOSE, CAPILLARY
Glucose-Capillary: 98 mg/dL (ref 70–99)
Glucose-Capillary: 98 mg/dL (ref 70–99)
Glucose-Capillary: 115 mg/dL — ABNORMAL HIGH (ref 70–99)

## 2012-07-01 LAB — UREA NITROGEN, URINE: Urea Nitrogen, Ur: 183 mg/dL

## 2012-07-01 LAB — IRON AND TIBC: TIBC: 432 ug/dL (ref 250–470)

## 2012-07-01 LAB — MAGNESIUM: Magnesium: 1.7 mg/dL (ref 1.5–2.5)

## 2012-07-01 LAB — FERRITIN: Ferritin: 18 ng/mL (ref 10–291)

## 2012-07-01 MED ORDER — POTASSIUM CHLORIDE CRYS ER 20 MEQ PO TBCR
30.0000 meq | EXTENDED_RELEASE_TABLET | ORAL | Status: AC
  Administered 2012-07-01 (×3): 30 meq via ORAL
  Filled 2012-07-01 (×5): qty 1

## 2012-07-01 MED ORDER — TRAMADOL HCL 50 MG PO TABS
50.0000 mg | ORAL_TABLET | Freq: Four times a day (QID) | ORAL | Status: DC | PRN
  Administered 2012-07-01 – 2012-07-04 (×4): 50 mg via ORAL
  Filled 2012-07-01 (×5): qty 1

## 2012-07-01 MED ORDER — MAGNESIUM SULFATE 40 MG/ML IJ SOLN
2.0000 g | Freq: Once | INTRAMUSCULAR | Status: AC
  Administered 2012-07-02: 2 g via INTRAVENOUS
  Filled 2012-07-01: qty 50

## 2012-07-01 NOTE — Progress Notes (Signed)
Subjective: Patient sob is improved.  She denies feeling dizzy or faint.  She states she was eating a lot of (half of a pickles) and dill pickled chips for the past 3 weeks.  She counts the sodium intake a home.   Objective: Vital signs in last 24 hours: Filed Vitals:   06/30/12 1520 06/30/12 2121 07/01/12 0011 07/01/12 0537  BP: 113/71 103/70 125/68 124/67  Pulse: 89 93 93 95  Temp: 97.5 F (36.4 C) 97.4 F (36.3 C)  97.3 F (36.3 C)  TempSrc: Oral Oral  Oral  Resp: _0 Height:      Weight:    261 lb 12.8 oz (118.752 kg)  SpO2: 97% 98% 97% 98%   Weight change:   Intake/Output Summary (Last 24 hours) at 07/01/12 1233 Last data filed at 07/01/12 1120  Gross per 24 hour  Intake    992 ml  Output   8701 ml  Net  -7709 ml   Vitals reviewed. General: resting in bed, NAD HEENT: [place]/at, no scleral icterus Cardiac: RRR, no rubs, murmurs or gallops Pulm: clear to auscultation bilaterally, no wheezes, rales, or rhonchi Abd: soft, nontender, nondistended, BS present, obese  Ext: warm and well perfused, 1+ pedal edema, chronic wounds to lower extremities b/l  Neuro: alert and oriented X3, grossly neurologically intact, moving all 4 extremities  Skin: skin breakdown to lower extremities   Lab Results: Basic Metabolic Panel:  Recent Labs Lab 06/30/12 1048 07/01/12 0455  NA 138 140  K 4.0 3.4*  CL 103 102  CO2 20 25  GLUCOSE 96 99  BUN 67* 58*  CREATININE 1.85* 1.49*  CALCIUM 8.7 8.8   Liver Function Tests:  Recent Labs Lab 06/30/12 1048  AST 38*  ALT 27  ALKPHOS 140*  BILITOT 0.8  PROT 7.3  ALBUMIN 3.2*   CBC:  Recent Labs Lab 06/30/12 1048  WBC 6.1  NEUTROABS 3.0  HGB 12.6  HCT 38.7  MCV 78.2  PLT 235   CBG:  Recent Labs Lab 06/28/12 1415 06/30/12 1723 06/30/12 2119 07/01/12 0616  GLUCAP 104* 105* 102* 98   Urinalysis:  Recent Labs Lab 06/30/12 1813  COLORURINE YELLOW  LABSPEC 1.009  PHURINE 5.5  GLUCOSEU NEGATIVE  HGBUR  NEGATIVE  BILIRUBINUR NEGATIVE  KETONESUR NEGATIVE  PROTEINUR NEGATIVE  UROBILINOGEN 0.2  NITRITE NEGATIVE  LEUKOCYTESUR NEGATIVE   Misc. Labs: none  Micro Results: No results found for this or any previous visit (from the past 240 hour(s)). Studies/Results: Dg Chest 2 View  06/30/2012   *RADIOLOGY REPORT*  Clinical Data: Shortness of breath, CHF.  CHEST - 2 VIEW  Comparison: 02/12/2012.  Findings: Trachea is midline.  Heart is enlarged, stable.  Lungs appear grossly clear.  No pleural fluid.  Flowing anterior osteophytosis in the thoracic spine.  IMPRESSION: No acute findings.   Original Report Authenticated By: Lorin Picket, M.D.   Medications:  Scheduled Meds: . fluticasone  2 spray Each Nare Daily  . heparin  5,000 Units Subcutaneous Q8H  . insulin aspart  0-9 Units Subcutaneous TID WC  . pantoprazole  20 mg Oral BID  . potassium chloride  30 mEq Oral Q4H  . simvastatin  10 mg Oral q1800  . vitamin B-12  250 mcg Oral Daily   Continuous Infusions: . furosemide (LASIX) infusion 15 mg/hr (07/01/12 0904)   PRN Meds:.acetaminophen, acetaminophen, albuterol, traMADol Assessment/Plan: 52 y.o signigicant PMH 52 y.o signigicant PMH presents with weight gain, dypsnea, acute on chronic respiratory failure, acute renal failure  and concern for acute on chronic diastolic HF and CRS.    #Acute on chronic diastolic CHF and core pulmonale -04/2012 echo with EF 44-01% grade 1 diastolic dsyfunction. Peak pressure 4mHg.  -HF with preserved ejection fraction likely cause of acute on chronic respiratory failure, dyspnea with exertion and weight gain. She has been eating a lot of dill potatoe chips and 1/2 pickles at at home and taking 3 Ibuprofen every 4-6 hours at home for leg wounds for the last 3 weeks   - Consulted CHF who is following and placed on Lasix gtt. Consider Milrinone if inadequate diuresis with lasix alone. Spoke with Dr. KCaryl Comeswho is agreeable to decrease gtt from 15 to 10 today  -d/c ARB - Strict  I &Os and daily weights (out 6.7 L today so far) on Lasix gtt -will trend BMET, Mag q 8 hours.  She is on K 20 meQ bid  - Possible right heart cath per Cardiology in the future  - NO Aspirin due allergies, Avoid NSAIDS -previously on Torsemide 40 mg daily at home   #Acute on chronic respiratory failure -Likely multifactorial and acutely secondary to acute diastolic HF exacerbation and cor pulmonale with severe pulmonary HTN history. She also has a history of restrictive lung disease and OSA on cpap.   -breathing improved with diuresis  -will ambulate with pulse ox today and record O2 sats -f/u with pulmonary outpatient   #Acute renal failure most likely multifactorial diuresis with Torsemide (outpatient), ARB and increased use of NSAIDS  -Cardiology concerned for CRS.  Creatinine on admission was 1.85 trended down to 1.49.  Baseline <0.8  #Dizziness, resolved   # Diabetes Mellitus well-controlled with hemoglobin A1c of 6 05/2012  - Hold metformin  -SSI, monitor cbg  #Lower extremities ulcer -Patient is followed up by wound care as an outpatient. Patient underwent lower extremity venous Doppler on 06/15/12 which showed right and left lower extremities: Bilateral pulsatile deep system with no evidence of thrombosis or valvular insufficiency. Right and left GSV and SSB no venous insufficiency was noted. Cultures were obtained on 05/17/12 which showed a few methicillin resistant staph aureus. Patient was treated with doxycycline. Currently on no antibiotic therapy.  - Will consult wound care  -prn Ultram for pain -consider H/H RN for wound care   #Sleep apnea -Cpap qhs   #DVT px   -Heparin   #F/E/N -NSL -will trend BMET. K 20 mg bid dialy -low Na diet  Dispo: Disposition is deferred at this time, awaiting improvement of current medical problems.  Anticipated discharge in approximately 2-3 day(s).   The patient does have a current PCP (Sandy Salaam CHoyle Sauer MD), therefore will be requiring  OPC follow-up after discharge.   The patient does have transportation limitations that hinder transportation to clinic appointments.  .Services Needed at time of discharge: Y = Yes, Blank = No PT:   OT:   RN:   Equipment:   Other:     LOS: 1 day   MCresenciano [id number]/28/2014, 12:33 PM

## 2012-07-01 NOTE — H&P (Signed)
INTERNAL MEDICINE TEACHING SERVICE Attending Admission Note  Date: 07/01/2012  Patient name: [name]  Medical record number: [id number]  Date of birth: 05-Jul-1960    I have seen and evaluated [name] and discussed their care with the Residency Team.  62 yr. Old female with Type 2 DM, severe pulm HTN, chronic venous stasis, OSA and restrictive lung disease, cor pulmonale and chronic diastolic HF, presented due to SOB and dizziness. She is noted to have Class IV HF symptoms and volume overload. She has responded well to IV lasix gtt.  She was noted to have evidence of AKI likely due to recent use of NSAIDs for leg pain and likely some cardiorenal physiology at this time. She has put out 7 L since admission. Feels better. Cardiology following. Follow BMP q8hrs with Mg as well and replace. Hemodynamics stable.   Dominic Pea, [place] 6/28/20141:45 PM

## 2012-07-01 NOTE — Progress Notes (Signed)
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 Admitted w recurrent volume accumulation in cntext of NSAIDs from wound clinic for diuresis  breating is much better Pain on her foot, prev helped with NSAIDs tylenol helps abit  Also concern for cardiorenal and ARB stopped       Past Medical History  Diagnosis Date  . OSA (obstructive sleep apnea)     CPAP  . Venous stasis ulcer     chornic, ?followed up at wound care center, multiple courses of antibiotics in past for cellulitis, on lasix  . GERD (gastroesophageal reflux disease)   . Anemia, iron deficiency     secondary to menhorrhagia, on oral iron, also b12 def, getting monthly b12 shots  . Chronic cough     secondary to alleriges and post nasal drip  . Depression   . Diabetes mellitus     well controlled on metformin  . Hyperlipidemia   . H/O mental retardation   . Herpes   . Cor pulmonale     PA Peak pressure 57mHg  . Diastolic heart failure   . Restrictive lung disease     PFTs 06/2012 (FVC 54% predicted and FEV1 68% predicted w minimal bronchodilator response).    Scheduled Meds:  Scheduled Meds: . fluticasone  2 spray Each Nare Daily  . heparin  5,000 Units Subcutaneous Q8H  . insulin aspart  0-9 Units Subcutaneous TID WC  . pantoprazole  20 mg Oral BID  . potassium chloride  20 mEq Oral BID  . simvastatin  10 mg Oral q1800  . vitamin B-12  250 mcg Oral Daily   Continuous Infusions: . furosemide (LASIX) infusion 15 mg/hr (07/01/12 0904)    PHYSICAL EXAM Filed Vitals:   06/30/12 1520 06/30/12 2121 07/01/12 0011 07/01/12 0537  BP: 113/71 103/70 125/68 124/67  Pulse: 89 93 93 95  Temp: 97.5 F (36.4 C) 97.4 F (36.3 C)  97.3 F (36.3 C)  TempSrc: Oral Oral  Oral  Resp: _0 Height:      Weight:    261 lb 12.8 oz (118.752 kg)  SpO2: 97% 98% 97% 98%   Weight down 10-15 lbs depending on measurement  Well developed and  Morbidly obese in no acute distress wearing O2 HENT normal Neck supple Clear Regular rate and rhythm, no murmurs or gallops Abd-soft with active BS No Clubbing cyanosis 2+edema Skin-warm and dry A & Oriented  Grossly normal sensory and motor function   TELEMETRY: Reviewed telemetry pt in NSR   Intake/Output Summary (Last 24 hours) at 07/01/12 0942 Last data filed at 07/01/12 0936  Gross per 24 hour  Intake    992 ml  Output   7901 ml  Net  -6909 ml    LABS: Basic Metabolic Panel:  Recent Labs Lab 06/28/12 1455 06/30/12 1048 07/01/12 0455  NA 141 138 140  K 4.0 4.0 3.4*  CL 107 103 102  CO2 _1 GLUCOSE 93 96 99  BUN 60* 67* 58*  CREATININE 1.67* 1.85* 1.49*  CALCIUM 8.7 8.7 8.8   Cardiac Enzymes: No results found for this basename: CKTOTAL, CKMB, CKMBINDEX, TROPONINI,  in the last 72 hours CBC:  Recent Labs Lab 06/30/12 1048  WBC 6.1  NEUTROABS 3.0  HGB 12.6  HCT 38.7  MCV 78.2  PLT 235   PROTIME: No results found for this basename: LABPROT, INR,  in the last 72 hours Liver Function Tests:  Recent Labs  06/30/12 1048  AST 38*  ALT 27  ALKPHOS 140*  BILITOT 0.8  PROT 7.3  ALBUMIN 3.2*   No results found for this basename: LIPASE, AMYLASE,  in the last 72 hours BNP: BNP (last 3 results)  Recent Labs  02/11/12 1519 02/14/12 0700  PROBNP 3892.0* 3408.0*     ASSESSMENT AND PLAN:  Active Problems:   SLEEP APNEA, OBSTRUCTIVE   Venous stasis ulcer   Type II or unspecified type diabetes mellitus with neurological manifestations, not stated as uncontrolled(250.60)   CHF exacerbation   Cor pulmonale, chronic   Restrictive lung disease   Acute renal failure   Acute-on-chronic respiratory failure   Acute on chronic diastolic heart failure  Renal function better with 7 L diuresis  :) Hypokalemia>>replete Continue diuretics  Signed, Virl Axe MD  07/01/2012

## 2012-07-01 NOTE — Progress Notes (Signed)
Case discussed with Dr. Sandy Salaam soon after the resident saw the patient. We reviewed the resident's history and exam and pertinent patient test results. I agree with the assessment, diagnosis, and plan of care documented in the resident's note.

## 2012-07-01 NOTE — Progress Notes (Signed)
SATURATION QUALIFICATIONS: (This note is used to comply with regulatory documentation for home oxygen)  Patient Saturations on Room Air at Rest = 97%  Patient Saturations on Room Air while Ambulating = 89%  Patient Saturations on 2 Liters of oxygen while Ambulating =100%  Please briefly explain why patient needs home oxygen: pt sob during ambulation.

## 2012-07-02 DIAGNOSIS — L0291 Cutaneous abscess, unspecified: Secondary | ICD-10-CM

## 2012-07-02 LAB — GLUCOSE, CAPILLARY
Glucose-Capillary: 94 mg/dL (ref 70–99)
Glucose-Capillary: 159 mg/dL — ABNORMAL HIGH (ref 70–99)
Glucose-Capillary: 122 mg/dL — ABNORMAL HIGH (ref 70–99)
Glucose-Capillary: 87 mg/dL (ref 70–99)

## 2012-07-02 LAB — MAGNESIUM
Magnesium: 2.1 mg/dL (ref 1.5–2.5)
Magnesium: 1.9 mg/dL (ref 1.5–2.5)

## 2012-07-02 LAB — BASIC METABOLIC PANEL
BUN: 48 mg/dL — ABNORMAL HIGH (ref 6–23)
BUN: 41 mg/dL — ABNORMAL HIGH (ref 6–23)
CO2: 27 mEq/L (ref 19–32)
Calcium: 8.6 mg/dL (ref 8.4–10.5)
Chloride: 99 mEq/L (ref 96–112)
Chloride: 96 mEq/L (ref 96–112)
Creatinine, Ser: 1.12 mg/dL — ABNORMAL HIGH (ref 0.50–1.10)
GFR calc Af Amer: 65 mL/min — ABNORMAL LOW (ref >90)
GFR calc non Af Amer: 56 mL/min — ABNORMAL LOW (ref >90)
Glucose, Bld: 99 mg/dL (ref 70–99)
Potassium: 3.6 mEq/L (ref 3.5–5.1)

## 2012-07-02 MED ORDER — POTASSIUM CHLORIDE CRYS ER 20 MEQ PO TBCR
20.0000 meq | EXTENDED_RELEASE_TABLET | Freq: Every day | ORAL | Status: DC
  Administered 2012-07-02 – 2012-07-04 (×3): 20 meq via ORAL
  Filled 2012-07-02 (×3): qty 1

## 2012-07-02 NOTE — Progress Notes (Signed)
Pt vomiting this am, provided comfort. Pt states she does this at home after wearing her CPAP at night at times.  No request at this time.

## 2012-07-02 NOTE — Progress Notes (Signed)
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 Admitted w recurrent volume accumulation in cntext of NSAIDs from wound clinic for diuresis  breathing is much better; foot pain is better   Prev helped with NSAIDs tylenol helps abit  Also concern for cardiorenal and ARB stopped       Past Medical History  Diagnosis Date  . OSA (obstructive sleep apnea)     CPAP  . Venous stasis ulcer     chornic, ?followed up at wound care center, multiple courses of antibiotics in past for cellulitis, on lasix  . GERD (gastroesophageal reflux disease)   . Anemia, iron deficiency     secondary to menhorrhagia, on oral iron, also b12 def, getting monthly b12 shots  . Chronic cough     secondary to alleriges and post nasal drip  . Depression   . Diabetes mellitus     well controlled on metformin  . Hyperlipidemia   . H/O mental retardation   . Herpes   . Cor pulmonale     PA Peak pressure 63mHg  . Diastolic heart failure   . Restrictive lung disease     PFTs 06/2012 (FVC 54% predicted and FEV1 68% predicted w minimal bronchodilator response).    Scheduled Meds:  Scheduled Meds: . fluticasone  2 spray Each Nare Daily  . heparin  5,000 Units Subcutaneous Q8H  . insulin aspart  0-9 Units Subcutaneous TID WC  . pantoprazole  20 mg Oral BID  . simvastatin  10 mg Oral q1800  . vitamin B-12  250 mcg Oral Daily   Continuous Infusions: . furosemide (LASIX) infusion 10 mg/hr (07/02/12 0930)    PHYSICAL EXAM Filed Vitals:   07/01/12 1559 07/01/12 2018 07/02/12 0200 07/02/12 0604  BP: 98/58 90/51  104/67  Pulse: 88 86 92 95  Temp: 97.2 F (36.2 C) 97.6 F (36.4 C)  97.3 F (36.3 C)  TempSrc: Oral Oral  Oral  Resp: _0 Height:      Weight:    259 lb (117.482  kg)  SpO2: 98% 96%  98%     Well developed and  Morbidly obese in no acute distress wearing O2 HENT normal Neck supple Clear Regular rate and rhythm, no murmurs or gallops Abd-soft with active BS No Clubbing cyanosis 2+edema lower exter eryhtema but not hot Skin-warm and dry A & Oriented  Grossly normal sensory and motor function   TELEMETRY: Reviewed telemetry pt in NSR   Intake/Output Summary (Last 24 hours) at 07/02/12 1044 Last data filed at 07/02/12 0932  Gross per 24 hour  Intake 1763.75 ml  Output   3051 ml  Net -1287.25 ml  -10L/2days  LABS: Basic Metabolic Panel:  Recent Labs Lab 06/28/12 1455 06/30/12 1048 07/01/12 0455  07/01/12 1230 07/01/12 1930 07/02/12 0538 07/02/12 0745  NA 141 138 140  --  138 138 137  --   K 4.0 4.0 3.4*  --  4.4 4.1 3.6  --   CL 107 103 102  --  98 99 99  --   CO2 _1 --  _2 --   GLUCOSE 93 96 99  --  72 118* 99  --   BUN 60* 67* 58*  --  51*  52* 48*  --   CREATININE 1.67* 1.85* 1.49*  --  1.21* 1.32* 1.13*  --   CALCIUM 8.7 8.7 8.8  --  8.7 8.6 8.8  --   MG  --   --   --   < > 1.9 1.7 2.1 2.1  < > = values in this interval not displayed. Cardiac Enzymes: No results found for this basename: CKTOTAL, CKMB, CKMBINDEX, TROPONINI,  in the last 72 hours CBC:  Recent Labs Lab 06/30/12 1048  WBC 6.1  NEUTROABS 3.0  HGB 12.6  HCT 38.7  MCV 78.2  PLT 235   PROTIME: No results found for this basename: LABPROT, INR,  in the last 72 hours Liver Function Tests:  Recent Labs  06/30/12 1048  AST 38*  ALT 27  ALKPHOS 140*  BILITOT 0.8  PROT 7.3  ALBUMIN 3.2*   No results found for this basename: LIPASE, AMYLASE,  in the last 72 hours BNP: BNP (last 3 results)  Recent Labs  02/11/12 1519 02/14/12 0700  PROBNP 3892.0* 3408.0*     ASSESSMENT AND PLAN:  Active Problems:   SLEEP APNEA, OBSTRUCTIVE   Venous stasis ulcer   Type II or unspecified type diabetes mellitus with neurological  manifestations, not stated as uncontrolled(250.60)   CHF exacerbation   Cor pulmonale, chronic   Restrictive lung disease   Acute renal failure   Acute-on-chronic respiratory failure   Acute on chronic diastolic heart failure   Wounds less uncomfotable Renal function better with 7 L diuresis :) Hypokalemia>>replete Continue diuretics>> decrease to 5 mg/hr   Renal function stable    Signed, Virl Axe MD  07/02/2012

## 2012-07-02 NOTE — Consult Note (Signed)
WOC consult Note Reason for Consult:Chronic, non-healing ulcerations of the bilateral feet (toes), and bilateral full thickness ulcerations on the anterior LEs.  Patient is followed by the outpatient wound care center at [facility] and will return to their care and oversight upon discharge. Wound type: Mixed etiology or arterial and venous insufficiency Pressure Ulcer POA: No Measurement:Right anterior LE:  1cm x 2cm x .2cm; Left anterior LE: 1cm x 1.5cm x .2cm.  Both present as full thickness ulcerations with a moist red wound bed and minimal (<25%) necrotic tissue or slough.  There is hemosiderin staining present on both LEs.  The right foot, great toe presents with a pinpoint darkened area that measures .1cm round and there is no depth.  The 2nd digit has a 1x 1.5cm ulcer with dried serum obscuring the base.  The 3rd digit has an ulceration measuring 0.5cm x 0.5cm with the same dried serum present.  The Left foot presents with ulcerations at the 2nd and 3rd toes:  the  2nd toe ulcer measures 1cm x 1.5cm and the 3rd toe ulcer measures .5cm x 1cm. Both wound beds are obscured by the presence of dried serum. Wound bed:As described above. Drainage (amount, consistency, odor) There is scant serous drainage from the anterior LE ulcers on the old dressings and no drainage from the toe ulcerations. Periwound: Intact.  The periwound tissue on the anterior LEs presents with hemosiderin staining as mentioned previously.  The toes are with erythema; more pronounced on the right foot than the left. There is no warmth and no pain (per patient). Dressing procedure/placement/frequency: I will implement conservative therapy here in house as patient will return to the outpatient wound care center at [facility] upon discharge. Soft silicone foam dressings will protect and provide a moist healing environment for the anterior LE ulcers; a betadine swabstick application to the toe ulcers will dry and impede  bacterial overgrowth. Patient reports that toe injuries come from dropping things upon them as she does not wear protective shoes.  Patient education provided on the benefits of protective shoe wear with diminished sensation and blood flow to her feet. [place] team will not follow.  Please re-consult if needed. Thanks, Maudie Flakes, MSN, RN, [facility], [place] [phone])

## 2012-07-02 NOTE — Progress Notes (Signed)
Subjective:  - Patient sob is improved.  She denies feeling dizzy or faint.   - BW 271-->259 LBs - UOP 3951 (negative -8186 since admission) -Cre 1.13; K=3.6     Objective: Vital signs in last 24 hours: Filed Vitals:   07/01/12 2018 07/02/12 0200 07/02/12 0604 07/02/12 1447  BP: 90/51  104/67 103/72  Pulse: 86 92 95 72  Temp: 97.6 F (36.4 C)  97.3 F (36.3 C) 98.8 F (37.1 C)  TempSrc: Oral  Oral Oral  Resp: _0 Height:      Weight:   259 lb (117.482 kg)   SpO2: 96%  98% 96%   Weight change: -12 lb 14.4 oz (-5.851 kg)  Intake/Output Summary (Last 24 hours) at 07/02/12 1650 Last data filed at 07/02/12 1639  Gross per 24 hour  Intake 1403.75 ml  Output   3251 ml  Net -1847.25 ml   Vitals reviewed. General: resting in bed, NAD HEENT: [place]/at, no scleral icterus Cardiac: RRR, no rubs, murmurs or gallops Pulm: clear to auscultation bilaterally, no wheezes, rales, or rhonchi Abd: soft, nontender, nondistended, BS present, obese  Ext: warm and well perfused, 1+ pedal edema, chronic wounds to lower extremities b/l  Neuro: alert and oriented X3, grossly neurologically intact, moving all 4 extremities  Skin: skin breakdown to lower extremities   Lab Results: Basic Metabolic Panel:  Recent Labs Lab 07/01/12 1930 07/02/12 0538 07/02/12 0745  NA 138 137  --   K 4.1 3.6  --   CL 99 99  --   CO2 27 27  --   GLUCOSE 118* 99  --   BUN 52* 48*  --   CREATININE 1.32* 1.13*  --   CALCIUM 8.6 8.8  --   MG 1.7 2.1 2.1   Liver Function Tests:  Recent Labs Lab 06/30/12 1048  AST 38*  ALT 27  ALKPHOS 140*  BILITOT 0.8  PROT 7.3  ALBUMIN 3.2*   CBC:  Recent Labs Lab 06/30/12 1048  WBC 6.1  NEUTROABS 3.0  HGB 12.6  HCT 38.7  MCV 78.2  PLT 235   CBG:  Recent Labs Lab 07/01/12 1151 07/01/12 1610 07/01/12 2131 07/02/12 0620 07/02/12 1056 07/02/12 1602  GLUCAP 105* 98 115* 94 159* 122*   Urinalysis:  Recent Labs Lab 06/30/12 1813   COLORURINE YELLOW  LABSPEC 1.009  PHURINE 5.5  GLUCOSEU NEGATIVE  HGBUR NEGATIVE  BILIRUBINUR NEGATIVE  KETONESUR NEGATIVE  PROTEINUR NEGATIVE  UROBILINOGEN 0.2  NITRITE NEGATIVE  LEUKOCYTESUR NEGATIVE   Misc. Labs: none  Micro Results: No results found for this or any previous visit (from the past 240 hour(s)). Studies/Results: No results found. Medications:  Scheduled Meds: . fluticasone  2 spray Each Nare Daily  . heparin  5,000 Units Subcutaneous Q8H  . insulin aspart  0-9 Units Subcutaneous TID WC  . pantoprazole  20 mg Oral BID  . simvastatin  10 mg Oral q1800  . vitamin B-12  250 mcg Oral Daily   Continuous Infusions: . furosemide (LASIX) infusion 5 mg/hr (07/02/12 1100)   PRN Meds:.acetaminophen, acetaminophen, albuterol, traMADol Assessment/Plan: 52 y.o signigicant PMH presents with weight gain, dypsnea, acute on chronic respiratory failure, acute renal failure and concern for acute on chronic diastolic HF and CRS.    #Acute on chronic diastolic CHF and core pulmonale -04/2012 echo with EF 79-48% grade 1 diastolic dsyfunction. Peak pressure 82mHg.  -HF with preserved ejection fraction likely cause of acute on chronic respiratory failure, dyspnea  with exertion and weight gain. She has been eating a lot of dill potatoe chips and 1/2 pickles at at home and taking 3 Ibuprofen every 4-6 hours at home for leg wounds for the last 3 weeks   - Consulted CHF who is following and placed on Lasix gtt. Consider Milrinone if inadequate diuresis with lasix alone. Lasix gtt dose was deceased to 5 mg/hr per Card.  - d/c'ed ARB - Strict I &Os and daily weights (out 8.1 L today so far) on Lasix gtt - will trend BMET, q 12 hours.   On KCl 20 mEq daily. - Possible right heart cath per Cardiology in the future  - NO Aspirin due allergies, Avoid NSAIDS - previously on Torsemide 40 mg daily at home   #Acute on chronic respiratory failure -Likely multifactorial and acutely secondary  to acute diastolic HF exacerbation and cor pulmonale with severe pulmonary HTN history. She also has a history of restrictive lung disease and OSA on cpap.   -breathing improved with diuresis  -will ambulate with pulse ox today and record O2 sats -f/u with pulmonary outpatient   #Acute renal failure most likely multifactorial diuresis with Torsemide (outpatient), ARB and increased use of NSAIDS  -Cardiology concerned for CRS.  Creatinine on admission was 1.85 trended down to 1.13.  Baseline <0.8  #Dizziness, resolved   # Diabetes Mellitus well-controlled with hemoglobin A1c of 6 05/2012  - Hold metformin  -SSI, monitor cbg  #Lower extremities ulcer -Patient is followed up by wound care as an outpatient. Patient underwent lower extremity venous Doppler on 06/15/12 which showed right and left lower extremities: Bilateral pulsatile deep system with no evidence of thrombosis or valvular insufficiency. Right and left GSV and SSB no venous insufficiency was noted. Cultures were obtained on 05/17/12 which showed a few methicillin resistant staph aureus. Patient was treated with doxycycline. Currently on no antibiotic therapy.  - Consulted wound care  -prn Ultram for pain -consider H/H RN for wound care   #Sleep apnea -Cpap qhs   #DVT px   -Heparin   #F/E/N -NSL -will trend BMET. On KCl 20 mEq daily. -low Na diet  Dispo: Disposition is deferred at this time, awaiting improvement of current medical problems.  Anticipated discharge in approximately 2-3 day(s).   The patient does have a current PCP Sandy Salaam, Hoyle Sauer, MD), therefore will be requiring OPC follow-up after discharge.   The patient does have transportation limitations that hinder transportation to clinic appointments.  .Services Needed at time of discharge: Y = Yes, Blank = No PT:   OT:   RN:   Equipment:   Other:     LOS: 2 days   Ivor Costa [phone] 07/02/2012, 4:50 PM

## 2012-07-03 DIAGNOSIS — I279 Pulmonary heart disease, unspecified: Secondary | ICD-10-CM

## 2012-07-03 DIAGNOSIS — J984 Other disorders of lung: Secondary | ICD-10-CM

## 2012-07-03 LAB — BASIC METABOLIC PANEL
BUN: 34 mg/dL — ABNORMAL HIGH (ref 6–23)
CO2: 29 mEq/L (ref 19–32)
Calcium: 8.6 mg/dL (ref 8.4–10.5)
Calcium: 8.5 mg/dL (ref 8.4–10.5)
Creatinine, Ser: 1.02 mg/dL (ref 0.50–1.10)
GFR calc Af Amer: 73 mL/min — ABNORMAL LOW (ref >90)
GFR calc non Af Amer: 63 mL/min — ABNORMAL LOW (ref >90)
GFR calc non Af Amer: 60 mL/min — ABNORMAL LOW (ref >90)
Glucose, Bld: 124 mg/dL — ABNORMAL HIGH (ref 70–99)
Potassium: 3.9 mEq/L (ref 3.5–5.1)
Sodium: 140 mEq/L (ref 135–145)

## 2012-07-03 LAB — MAGNESIUM
Magnesium: 1.8 mg/dL (ref 1.5–2.5)
Magnesium: 1.6 mg/dL (ref 1.5–2.5)

## 2012-07-03 LAB — GLUCOSE, CAPILLARY
Glucose-Capillary: 107 mg/dL — ABNORMAL HIGH (ref 70–99)
Glucose-Capillary: 113 mg/dL — ABNORMAL HIGH (ref 70–99)

## 2012-07-03 MED ORDER — POTASSIUM CHLORIDE 20 MEQ/15ML (10%) PO LIQD
20.0000 meq | Freq: Once | ORAL | Status: AC
  Administered 2012-07-03: 20 meq via ORAL
  Filled 2012-07-03: qty 15

## 2012-07-03 MED ORDER — MAGNESIUM OXIDE 400 (241.3 MG) MG PO TABS
400.0000 mg | ORAL_TABLET | Freq: Once | ORAL | Status: AC
  Administered 2012-07-03: 400 mg via ORAL
  Filled 2012-07-03: qty 1

## 2012-07-03 NOTE — Progress Notes (Signed)
Pt ambulated in hallway, Oxygen remained around 91-92% on RA.

## 2012-07-03 NOTE — Progress Notes (Signed)
Subjective: SOB is improved. She has a dry cough.  She thinks her cpap machine at home is better tolerated than the one here. Denies abdominal pain    Objective: Vital signs in last 24 hours: Filed Vitals:   07/02/12 2055 07/03/12 0045 07/03/12 0540 07/03/12 0830  BP: 124/86  120/69 95/64  Pulse: 91 88 95   Temp: 97.2 F (36.2 C)  97.6 F (36.4 C)   TempSrc: Oral  Oral   Resp: _0 Height:      Weight:   258 lb 1.6 oz (117.073 kg)   SpO2: 100% 98% 95% 100%   Weight change: -14.4 oz (-0.408 kg)  Intake/Output Summary (Last 24 hours) at 07/03/12 1102 Last data filed at 07/03/12 0852  Gross per 24 hour  Intake   1495 ml  Output   3225 ml  Net  -1730 ml   Vitals reviewed. General: resting in recliner, NAD HEENT: [place]/at, no scleral icterus Cardiac: RRR, no rubs, murmurs or gallops Pulm: clear to auscultation bilaterally, no wheezes, rales, or rhonchi Abd: soft, nontender, nondistended, BS present, obese  Ext: warm and well perfused, 1+ pedal edema, chronic wounds to lower extremities b/l  Neuro: alert and oriented X3, grossly neurologically intact, moving all 4 extremities  Skin: skin breakdown to lower extremities   Lab Results: Basic Metabolic Panel:  Recent Labs Lab 07/02/12 1936 07/03/12 0921  NA 136 139  K 3.6 3.2*  CL 96 99  CO2 29 29  GLUCOSE 87 172*  BUN 41* 34*  CREATININE 1.12* 1.02  CALCIUM 8.6 8.6  MG 1.9 1.8   Liver Function Tests:  Recent Labs Lab 06/30/12 1048  AST 38*  ALT 27  ALKPHOS 140*  BILITOT 0.8  PROT 7.3  ALBUMIN 3.2*   CBC:  Recent Labs Lab 06/30/12 1048  WBC 6.1  NEUTROABS 3.0  HGB 12.6  HCT 38.7  MCV 78.2  PLT 235   CBG:  Recent Labs Lab 07/01/12 2131 07/02/12 0620 07/02/12 1056 07/02/12 1602 07/02/12 2113 07/03/12 0644  GLUCAP 115* 94 159* 122* 87 91   Urinalysis:  Recent Labs Lab 06/30/12 1813  COLORURINE YELLOW  LABSPEC 1.009  PHURINE 5.5  GLUCOSEU NEGATIVE  HGBUR NEGATIVE  BILIRUBINUR  NEGATIVE  KETONESUR NEGATIVE  PROTEINUR NEGATIVE  UROBILINOGEN 0.2  NITRITE NEGATIVE  LEUKOCYTESUR NEGATIVE   Misc. Labs: none  Micro Results: No results found for this or any previous visit (from the past 240 hour(s)). Studies/Results: No results found. Medications:  Scheduled Meds: . fluticasone  2 spray Each Nare Daily  . heparin  5,000 Units Subcutaneous Q8H  . insulin aspart  0-9 Units Subcutaneous TID WC  . pantoprazole  20 mg Oral BID  . potassium chloride  20 mEq Oral Daily  . simvastatin  10 mg Oral q1800  . vitamin B-12  250 mcg Oral Daily   Continuous Infusions: . furosemide (LASIX) infusion 5 mg/hr (07/02/12 1100)   PRN Meds:.acetaminophen, acetaminophen, albuterol, traMADol Assessment/Plan: 52 y.o signigicant PMH presents with weight gain, dypsnea, acute on chronic respiratory failure, acute renal failure and concern for acute on chronic diastolic HF and CRS.    #Acute on chronic diastolic CHF and core pulmonale -04/2012 echo with EF 16-10% grade 1 diastolic dsyfunction. Peak pressure 50mHg.  -HF with preserved ejection fraction likely cause of acute on chronic respiratory failure, dyspnea with exertion and weight gain. She has been eating a lot of dill potatoe chips and 1/2 pickles at at home and  taking 3 Ibuprofen every 4-6 hours at home for leg wounds for the last 3 weeks   - Consulted CHF who is following and placed on Lasix gtt.  Lasix gtt dose was increased 5 to 10 mg/hr per Card. Spoke with Dr. Aundra Dubin today - d/c'ed ARB - Strict I &Os and daily weights on Lasix gtt - will trend BMET, q 12 hours.   On KCl 20 mEQ daily. - Possible right heart cath per Cardiology in the future  - NO Aspirin due allergies, Avoid NSAIDS - previously on Torsemide 40 mg daily at home   #Acute on chronic respiratory failure -Likely multifactorial and acutely secondary to acute on chronic diastolic HF exacerbation and cor pulmonale with severe pulmonary HTN history. She also has  a history of restrictive lung disease and OSA on cpap.   -breathing improved with diuresis  -will ambulate with pulse ox today and record O2 sats again as previously did with desat to 89% -needs to f/u with pulmonary outpatient   #Acute renal failure most likely multifactorial diuresis with Torsemide (outpatient), ARB and increased use of NSAIDS  -Cardiology concerned for CRS.  Creatinine on admission was 1.85 trended down to 1.12 to 1.02.  Baseline <0.8   # Diabetes Mellitus well-controlled with hemoglobin A1c of 6 05/2012  - Hold metformin  -SSI, monitor cbg  #Lower extremities ulcer -Patient is followed up by wound care as an outpatient.  - Consulted wound care who recommends outpatient wound care and will not aggressively treat inpatient  -prn Ultram for pain -H/H RN for wound care   #Sleep apnea -Cpap qhs   #DVT px   -Heparin   #F/E/N -NSL -HypoK 3.2 today.  will trend BMET. On KCl 20 mEq daily given extra dose of 20 mEQ. -low Na diet  Dispo: Disposition is deferred at this time, awaiting improvement of current medical problems.  Anticipated discharge in approximately 1-2 day(s).   The patient does have a current PCP Sandy Salaam, Hoyle Sauer, MD), therefore will be requiring OPC follow-up after discharge.   The patient does have transportation limitations that hinder transportation to clinic appointments.  .Services Needed at time of discharge: Y = Yes, Blank = No PT:   OT:   RN:   Equipment:   Other:     LOS: 3 days   Cresenciano Genre [phone] 07/03/2012, 11:02 AM

## 2012-07-03 NOTE — Care Management Note (Signed)
    Page 1 of 2   07/03/2012     4:40:41 PM   CARE MANAGEMENT NOTE 07/03/2012  Patient:  [name], [name]   Account Number:  [id number]  Date Initiated:  07/03/2012  Documentation initiated by:  Llana Aliment  Subjective/Objective Assessment:   52yo female admitted with CHF.  Pt. lives with her son in [place].     Action/Plan:   discharge planning   Anticipated DC Date:  07/05/2012   Anticipated DC Plan:  [place]  CM consult      [facility] Choice  HOME HEALTH   Choice offered to / List presented to:          [facility] arranged  [place] RN      [place].   Status of service:  In process, will continue to follow Medicare Important Message given?   (If response is "NO", the following Medicare IM given date fields will be blank) Date Medicare IM given:   Date Additional Medicare IM given:    Discharge Disposition:    Per UR Regulation:  Reviewed for med. necessity/level of care/duration of stay  If discussed at [place] of Stay Meetings, dates discussed:    Comments:  07/03/12 1615 In to speak with pt. about home health services.  Pt. states she is currently being [facility] by [facility] and has used [place] in past.  TC to Lelan Pons, with [facility], to give referral for [facility] RN and [place].  Pt. on Lasix gtt currently.  NCM will continue to monitor. Llana Aliment, RN, BSN NCM [phone]

## 2012-07-03 NOTE — Progress Notes (Signed)
Patient not ready to wear cpap at this time.

## 2012-07-03 NOTE — Progress Notes (Signed)
CARDIAC REHAB PHASE I   PRE:  Rate/Rhythm: 99 SR  BP:  Sitting: 99/72     SaO2: 95% 2liters  MODE:  Ambulation: 100 ft   POST:  Rate/Rhythm: 114 SR  BP:  Sitting: 109/66     SaO2: 90-94 RA  13:30-14:02 Patient walked with walker and assist x 2. Used orthopedic shoes due to the sores on her feet.  Patient was limited in walking due to the pain in her feet. Sats maintained 90-94% on RA during walk. Back to recliner with call light.   [facility], [place] 07/03/2012 2:00 PM

## 2012-07-03 NOTE — Progress Notes (Signed)
Dressings changed to bilateral feet per MD orders, pt tolerated it well, ambulated several times in hallway today with walker, NAD noted, patient able to voice needs, will continue to monitor.

## 2012-07-03 NOTE — Progress Notes (Signed)
Advanced Heart Failure Rounding Note   Subjective:    [name] is a 52 y.o. 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   We saw her in the HF Clinic for the first time earlier this week. She was admitted to the hospital in 4/14 when she was diagnosed with HF and PAH. Echo as below. On admit to hospital in 4/14 weight was 297. Got down to 255. But in clinic weight was 270 despite recent increases in lasix. Now 271. PH felt to be due to OHS, OSA and diastolic HF. We switched her to torsemide and ordered labs. I walked her round clinic with pulse ox sats 97%->93%   However over past few days weight continues to go up and she is more dyspneic now with Class IV symptoms. Creatine also continues to climb from 1.3-> 1.85. Admitted for management. Of note, she has been taking ibuprofen 600 mg 3-4x/day for diabetic foot ulcer pain as prescribed by Wound Care.   Echo 4/14.  - Left ventricle: The cavity size was normal. Wall thickness was increased in a pattern of mild LVH. Systolic function was normal. The estimated ejection fraction was in the range of 55% to 60%. Wall motion was normal; there were no regional wall motion abnormalities. Septal wall flattening consistent with elevated right ventricular pressure/volume. Doppler parameters are consistent with abnormal left ventricular relaxation (grade 1 diastolic dysfunction). - Left atrium: The atrium was mildly dilated. - Right ventricle: The cavity size was severely dilated. Systolic function was severely reduced. - Right atrium: The atrium was moderately dilated. - Pulmonary arteries: Systolic pressure was severely increased. PA peak pressure: 88m Hg (S). - Pericardium, extracardiac: A moderate, free-flowing pericardial effusion was identified posterior to the  heart and along the left ventricular free wall. There was no evidence of hemodynamic compromise.   Stable over the weekend. Lasix gtt cut back to 578mhr. Weight down 1 lb (total 13 lbs). 24 hr I/O 2.2 L. Cr 1.12. Denies SOB, orthopnea, or CP. Diabetic foot pain improving.    Objective:   Weight Range:  Vital Signs:   Temp:  [97.2 F (36.2 C)-98.8 F (37.1 C)] 97.6 F (36.4 C) (06/30 0540) Pulse Rate:  [72-95] 95 (06/30 0540) Resp:  [18-20] 18 (06/30 0540) BP: (103-124)/(69-86) 120/69 mmHg (06/30 0540) SpO2:  [95 %-100 %] 95 % (06/30 0540) Weight:  [258 lb 1.6 oz (117.073 kg)] 258 lb 1.6 oz (117.073 kg) (06/30 0540) Last BM Date: 07/01/12  Weight change: Filed Weights   07/01/12 0537 07/02/12 0604 07/03/12 0540  Weight: 261 lb 12.8 oz (118.752 kg) 259 lb (117.482 kg) 258 lb 1.6 oz (117.073 kg)    Intake/Output:   Intake/Output Summary (Last 24 hours) at 07/03/12 0801 Last data filed at 07/03/12 0600  Gross per 24 hour  Intake   1235 ml  Output   3525 ml  Net  -2290 ml     Physical Exam: General: Morbidly obese. No resp difficulty HEENT: normal Neck: supple. JVP difficult to assess d/t body habitus. Carotids 2+ bilat; no bruits. No lymphadenopathy or thryomegaly appreciated. Cor: PMI nonpalpable. +RV lift; Regular rate & rhythm. 2/6 TR +R s3 Lungs: clear Abdomen: soft, nontender, nondistended. No hepatosplenomegaly. No bruits or masses. Good bowel sounds. Extremities: no cyanosis, clubbing, rash, woody 1+ edema Neuro: alert & orientedx3, cranial nerves grossly intact. moves  all 4 extremities w/o difficulty. Affect pleasant  Telemetry: SR 80-90s  Labs: Basic Metabolic Panel:  Recent Labs Lab 07/01/12 0455 07/01/12 1230 07/01/12 1930 07/02/12 0538 07/02/12 0745 07/02/12 1936  NA 140 138 138 137  --  136  K 3.4* 4.4 4.1 3.6  --  3.6  CL 102 98 99 99  --  96  CO2 _0 --  29  GLUCOSE 99 72 118* 99  --  87  BUN 58* 51* 52* 48*  --  41*  CREATININE  1.49* 1.21* 1.32* 1.13*  --  1.12*  CALCIUM 8.8 8.7 8.6 8.8  --  8.6  MG  --  1.9 1.7 2.1 2.1 1.9    Liver Function Tests:  Recent Labs Lab 06/30/12 1048  AST 38*  ALT 27  ALKPHOS 140*  BILITOT 0.8  PROT 7.3  ALBUMIN 3.2*   No results found for this basename: LIPASE, AMYLASE,  in the last 168 hours No results found for this basename: AMMONIA,  in the last 168 hours  CBC:  Recent Labs Lab 06/30/12 1048  WBC 6.1  NEUTROABS 3.0  HGB 12.6  HCT 38.7  MCV 78.2  PLT 235    Cardiac Enzymes: No results found for this basename: CKTOTAL, CKMB, CKMBINDEX, TROPONINI,  in the last 168 hours  BNP: BNP (last 3 results)  Recent Labs  02/11/12 1519 02/14/12 0700  PROBNP 3892.0* 3408.0*     Other results:  EKG:   Imaging:  No results found.   Medications:     Scheduled Medications: . fluticasone  2 spray Each Nare Daily  . heparin  5,000 Units Subcutaneous Q8H  . insulin aspart  0-9 Units Subcutaneous TID WC  . pantoprazole  20 mg Oral BID  . potassium chloride  20 mEq Oral Daily  . simvastatin  10 mg Oral q1800  . vitamin B-12  250 mcg Oral Daily     Infusions: . furosemide (LASIX) infusion 5 mg/hr (07/02/12 1100)     PRN Medications:  acetaminophen, acetaminophen, albuterol, traMADol   Assessment:   1) Acute and chronic respiratory failure  2) Cor pulmonale  3) Pulmonary HTN  4) A/C diastolic HF  5) Morbid obesity  6) A/C renal failure  7) Non-healing wounds  8) Mental retardation  9) Microcytic anemia    Plan/Discussion:    Volume status still appears to be up, will increase lasix back to 10 mg/hr and try to get as dry as possible. Switched labs to daily BMET.   Will consult CR.   Length of Stay: 3   Rande Brunt 07/03/2012, 8:01 AM  Advanced Heart Failure Team Pager [phone] (M-F; 7a - 4p)  Please contact [facility] for night-coverage after hours (4p -7a ) and weekends on [email or web address]  Patient seen with NP, agree  with the above note.  Will increase Lasix gtt back to 10 mg/hr.  She is still volume overloaded with elevated JVP and needs more diuresis.   Loralie Champagne 07/03/2012 10:12 AM

## 2012-07-03 NOTE — Progress Notes (Signed)
  Date: 07/03/2012  Patient name: MUSETTE KISAMORE  Medical record number: [id number]  Date of birth: 01-Aug-1960   This patient has been seen and the plan of care was discussed with the house staff. Please see their note for complete details. I concur with their findings with the following additions/corrections:  Feels better today. Continues to diurese well, ~-10 L since admission. Cardiology following. Her renal function is improving which is consistent with cardiorenal physiology. Please follow K, Mg at least twice daily as she is on lasix gtt.  Dominic Pea, DO 07/03/2012, 1:46 PM

## 2012-07-03 NOTE — Evaluation (Signed)
Physical Therapy Evaluation Patient Details Name: [name] MRN: [id number] DOB: 19-Apr-1960 Today's Date: 07/03/2012 Time: 4401-0272 PT Time Calculation (min): 21 min  PT Assessment / Plan / Recommendation History of Present Illness  Pt admitted with SOB and dizziness  Clinical Impression  Pt with noted SOB with ambulation however SpO2 between 90-94% on RA. Pt denies dizziness at this time but reports daily when she gets up for the first time. Pt functioning near baseline and would benefit from use of RW to improve ambulation endurance. Pt safe to d/c home once medically stable.    PT Assessment  Patient needs continued PT services    Follow Up Recommendations  Supervision - Intermittent    Does the patient have the potential to tolerate intense rehabilitation      Barriers to Discharge Decreased caregiver support      Equipment Recommendations  Rolling walker with 5" wheels    Recommendations for Other Services     Frequency Min 3X/week    Precautions / Restrictions Precautions Precautions: None Restrictions Weight Bearing Restrictions: No   Pertinent Vitals/Pain Pt denies pain      Mobility  Bed Mobility Bed Mobility: Not assessed Transfers Transfers: Sit to Stand;Stand to Sit Sit to Stand: 5: Supervision;From elevated surface;With armrests;From chair/3-in-1 Stand to Sit: 5: Supervision;With armrests;To chair/3-in-1 Details for Transfer Assistance: pt with good technique Ambulation/Gait Ambulation/Gait Assistance: 4: Min guard Ambulation Distance (Feet): 100 Feet Assistive device: Straight cane Ambulation/Gait Assistance Details: +SOB, SpO2 90-94% on RA but pt requested O2 due to SOB Gait Pattern: Step-through pattern;Decreased stride length;Wide base of support (wore post op shoes due to wounds) Gait velocity: decr    Exercises     PT Diagnosis: Difficulty walking;Generalized weakness  PT Problem List: Decreased activity tolerance;Decreased  balance;Decreased mobility PT Treatment Interventions: DME instruction;Gait training;Stair training;Functional mobility training;Therapeutic activities;Therapeutic exercise     PT Goals(Current goals can be found in the care plan section) Acute Rehab PT Goals Patient Stated Goal: home PT Goal Formulation: With patient Time For Goal Achievement: 07/10/12 Potential to Achieve Goals: Good  Visit Information  Last PT Received On: 07/03/12 Assistance Needed: +1 History of Present Illness: Pt admitted with SOB and dizziness       Prior [place] expects to be discharged to:: Private residence Living Arrangements: Children Available Help at Discharge: Family;Available PRN/intermittently Type of Home: House Home Access: Stairs to enter [employer] of Steps: 3 Entrance Stairs-Rails: Left Home Layout: One level Home Equipment: Cane - single point Additional Comments: pt using cab or bus to get groceries. pt has been sponge bathing Prior Function Level of Independence: Independent with assistive device(s) Comments: pt uses straight cane Communication Communication: No difficulties Dominant Hand: Right    Cognition  Cognition Arousal/Alertness: Awake/alert Behavior During Therapy: WFL for tasks assessed/performed Overall Cognitive Status: Within Functional Limits for tasks assessed    Extremity/Trunk Assessment Upper Extremity Assessment Upper Extremity Assessment: Overall WFL for tasks assessed Lower Extremity Assessment Lower Extremity Assessment: Overall WFL for tasks assessed (noted open sores at bilat toes - covered by dressing) Cervical / Trunk Assessment Cervical / Trunk Assessment: Normal   Balance    End of Session PT - End of Session Equipment Utilized During Treatment: Gait belt Activity Tolerance: Patient limited by fatigue Patient left: in chair;with call bell/phone within reach Nurse Communication: Mobility status  GP      Kingsley Callander 07/03/2012, 5:00 PM  Kittie Plater, PT, DPT Pager #: [phone] Office #:  [phone]

## 2012-07-04 DIAGNOSIS — K219 Gastro-esophageal reflux disease without esophagitis: Secondary | ICD-10-CM

## 2012-07-04 DIAGNOSIS — E1149 Type 2 diabetes mellitus with other diabetic neurological complication: Secondary | ICD-10-CM

## 2012-07-04 DIAGNOSIS — G4733 Obstructive sleep apnea (adult) (pediatric): Secondary | ICD-10-CM

## 2012-07-04 DIAGNOSIS — E669 Obesity, unspecified: Secondary | ICD-10-CM

## 2012-07-04 LAB — BASIC METABOLIC PANEL
BUN: 28 mg/dL — ABNORMAL HIGH (ref 6–23)
BUN: 24 mg/dL — ABNORMAL HIGH (ref 6–23)
CO2: 28 mEq/L (ref 19–32)
CO2: 30 mEq/L (ref 19–32)
Chloride: 101 mEq/L (ref 96–112)
Chloride: 99 mEq/L (ref 96–112)
Creatinine, Ser: 0.93 mg/dL (ref 0.50–1.10)
GFR calc Af Amer: 81 mL/min — ABNORMAL LOW (ref >90)
GFR calc non Af Amer: 51 mL/min — ABNORMAL LOW (ref >90)
Glucose, Bld: 153 mg/dL — ABNORMAL HIGH (ref 70–99)
Glucose, Bld: 93 mg/dL (ref 70–99)
Potassium: 3.3 mEq/L — ABNORMAL LOW (ref 3.5–5.1)
Potassium: 3.5 mEq/L (ref 3.5–5.1)
Sodium: 139 mEq/L (ref 135–145)

## 2012-07-04 LAB — GLUCOSE, CAPILLARY
Glucose-Capillary: 104 mg/dL — ABNORMAL HIGH (ref 70–99)
Glucose-Capillary: 120 mg/dL — ABNORMAL HIGH (ref 70–99)

## 2012-07-04 MED ORDER — MAGNESIUM SULFATE 40 MG/ML IJ SOLN
2.0000 g | Freq: Once | INTRAMUSCULAR | Status: AC
  Administered 2012-07-04: 2 g via INTRAVENOUS
  Filled 2012-07-04: qty 50

## 2012-07-04 MED ORDER — POTASSIUM CHLORIDE CRYS ER 20 MEQ PO TBCR
40.0000 meq | EXTENDED_RELEASE_TABLET | Freq: Every day | ORAL | Status: DC
  Administered 2012-07-05 – 2012-07-07 (×3): 40 meq via ORAL
  Filled 2012-07-04 (×4): qty 2

## 2012-07-04 MED ORDER — POTASSIUM CHLORIDE CRYS ER 20 MEQ PO TBCR
40.0000 meq | EXTENDED_RELEASE_TABLET | Freq: Once | ORAL | Status: AC
  Administered 2012-07-04: 40 meq via ORAL

## 2012-07-04 NOTE — Progress Notes (Signed)
  Date: 07/04/2012  Patient name: [name]  Medical record number: [id number]  Date of birth: [birth year]   This patient has been seen and the plan of care was discussed with the house staff. Please see their note for complete details. I concur with their findings with the following additions/corrections:  Feels well today. Still noted to have some SOB, especially notable with conversation. She is -13 L. Continue lasix gtt. Watch electrolytes. Card following. Cr improving and no definite contraction alkalosis, she has room to diurese. No dizziness, no CP.   Dominic Pea, DO 07/04/2012, 2:33 PM

## 2012-07-04 NOTE — Progress Notes (Signed)
UR Chart Review Completed

## 2012-07-04 NOTE — Progress Notes (Signed)
CARDIAC REHAB PHASE I   PRE:  Rate/Rhythm: 100 SR  BP:  Supine:   Sitting: 105/71  Standing:    SaO2: 93 1L 91 RA  MODE:  Ambulation: 140 ft   POST:  Rate/Rhythm: 107  BP:  Supine:   Sitting: 106/73  Standing:    SaO2: 93 RA 1400-1435 Assisted X 2  and used walker to ambulate. Gait steady with walker. Pt states that her feet feel less sore today. VS stable Pt back to recliner after walk with call light in reach. Walked pt on room air sat after walk 93% O2 left off after walk. Pt to [facility] after walk c/o of constipation, reported to RN.  Rodney Langton RN 07/04/2012 2:33 PM

## 2012-07-04 NOTE — Progress Notes (Addendum)
Advanced Heart Failure Rounding Note   Subjective:    [name] is a 52 y.o. 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   We saw her in the HF Clinic for the first time earlier this week. She was admitted to the hospital in 4/14 when she was diagnosed with HF and PAH. Echo as below. On admit to hospital in 4/14 weight was 297. Got down to 255. But in clinic weight was 270 despite recent increases in lasix. Now 271. PH felt to be due to OHS, OSA and diastolic HF. We switched her to torsemide and ordered labs. I walked her round clinic with pulse ox sats 97%->93%   However over past few days weight continues to go up and she is more dyspneic now with Class IV symptoms. Creatine also continues to climb from 1.3-> 1.85. Admitted for management. Of note, she has been taking ibuprofen 600 mg 3-4x/day for diabetic foot ulcer pain as prescribed by Wound Care.   Echo 4/14.  - Left ventricle: The cavity size was normal. Wall thickness was increased in a pattern of mild LVH. Systolic function was normal. The estimated ejection fraction was in the range of 55% to 60%. Wall motion was normal; there were no regional wall motion abnormalities. Septal wall flattening consistent with elevated right ventricular pressure/volume. Doppler parameters are consistent with abnormal left ventricular relaxation (grade 1 diastolic dysfunction). - Left atrium: The atrium was mildly dilated. - Right ventricle: The cavity size was severely dilated. Systolic function was severely reduced. - Right atrium: The atrium was moderately dilated. - Pulmonary arteries: Systolic pressure was severely increased. PA peak pressure: 27m Hg (S). - Pericardium, extracardiac: A moderate, free-flowing pericardial effusion was identified posterior to the  heart and along the left ventricular free wall. There was no evidence of hemodynamic compromise.   Lasix gtt increased to 143mhr yesterday. Weight down 2 lbs (total 15 lbs). 24 hr I/O 2.3 L. Working with PT and CR. Denies SOB, CP or orthopnea. Wearing CPAP at night.   Objective:   Weight Range:  Vital Signs:   Temp:  [98.1 F (36.7 C)-98.5 F (36.9 C)] 98.5 F (36.9 C) (07/01 0538) Pulse Rate:  [96] 96 (07/01 0538) Resp:  [19-20] 20 (07/01 0538) BP: (95-118)/(59-72) 105/72 mmHg (07/01 0538) SpO2:  [91 %-100 %] 98 % (07/01 0538) Weight:  [256 lb 13.4 oz (116.5 kg)] 256 lb 13.4 oz (116.5 kg) (07/01 0538) Last BM Date: 07/03/12  Weight change: Filed Weights   07/02/12 0604 07/03/12 0540 07/04/12 0538  Weight: 259 lb (117.482 kg) 258 lb 1.6 oz (117.073 kg) 256 lb 13.4 oz (116.5 kg)    Intake/Output:   Intake/Output Summary (Last 24 hours) at 07/04/12 0652 Last data filed at 07/04/12 0649  Gross per 24 hour  Intake 1431.42 ml  Output   3775 ml  Net -2343.58 ml     Physical Exam: General: Morbidly obese. No resp difficulty HEENT: normal Neck: supple. JVP difficult to assess d/t body habitus. Carotids 2+ bilat; no bruits. No lymphadenopathy or thryomegaly appreciated. Cor: PMI nonpalpable. +RV lift; Regular rate & rhythm. 2/6 TR +R s3 Lungs: clear Abdomen: soft, nontender, nondistended. No hepatosplenomegaly. No bruits or masses. Good bowel sounds. Extremities: no cyanosis, clubbing, rash, woody 1+ edema Neuro: alert & orientedx3, cranial nerves grossly intact. moves all 4 extremities w/o difficulty.  Affect pleasant  Telemetry: SR 80-90s  Labs: Basic Metabolic Panel:  Recent Labs Lab 07/01/12 1930 07/02/12 0538 07/02/12 0745 07/02/12 1936 07/03/12 0921 07/03/12 1901  NA 138 137  --  136 139 140  K 4.1 3.6  --  3.6 3.2* 3.9  CL 99 99  --  96 99 103  CO2 27 27  --  _0 GLUCOSE 118* 99  --  87 172* 124*  BUN 52* 48*  --  41* 34* 31*  CREATININE 1.32*  1.13*  --  1.12* 1.02 1.05  CALCIUM 8.6 8.8  --  8.6 8.6 8.5  MG 1.7 2.1 2.1 1.9 1.8 1.6    Liver Function Tests:  Recent Labs Lab 06/30/12 1048  AST 38*  ALT 27  ALKPHOS 140*  BILITOT 0.8  PROT 7.3  ALBUMIN 3.2*   No results found for this basename: LIPASE, AMYLASE,  in the last 168 hours No results found for this basename: AMMONIA,  in the last 168 hours  CBC:  Recent Labs Lab 06/30/12 1048  WBC 6.1  NEUTROABS 3.0  HGB 12.6  HCT 38.7  MCV 78.2  PLT 235    Cardiac Enzymes: No results found for this basename: CKTOTAL, CKMB, CKMBINDEX, TROPONINI,  in the last 168 hours  BNP: BNP (last 3 results)  Recent Labs  02/11/12 1519 02/14/12 0700  PROBNP 3892.0* 3408.0*     Other results:  EKG:   Imaging: No results found.   Medications:     Scheduled Medications: . fluticasone  2 spray Each Nare Daily  . heparin  5,000 Units Subcutaneous Q8H  . insulin aspart  0-9 Units Subcutaneous TID WC  . pantoprazole  20 mg Oral BID  . potassium chloride  20 mEq Oral Daily  . simvastatin  10 mg Oral q1800  . vitamin B-12  250 mcg Oral Daily    Infusions: . furosemide (LASIX) infusion Stopped (07/03/12 1405)    PRN Medications: acetaminophen, acetaminophen, albuterol, traMADol   Assessment:   1) Acute and chronic respiratory failure  2) Cor pulmonale  3) Pulmonary HTN  4) A/C diastolic HF  5) Morbid obesity  6) A/C renal failure  7) Non-healing wounds  8) Mental retardation  9) Microcytic anemia    Plan/Discussion:    Difficult to assess volume, however still appears to be mildly elevated. Awaiting BMET this am, however last nights Cr stable. Will continue another day of lasix at 10 mg/hr and will try to switch to PO demadex 60 mg BID in the next day or two. Will need close follow up with [facility]. Continue to work with PT and CR.  Length of Stay: 4   Rande Brunt 07/04/2012, 6:52 AM  Advanced Heart Failure Team Pager [phone] (M-F; 7a - 4p)   Please contact [facility] for night-coverage after hours (4p -7a ) and weekends on [email or web address]  Patient seen with NP, agree with the above note.  She still is mildly volume overloaded.  Would favor one more day of IV Lasix, this should get her back to near her goal weight.  Will then switch over to torsemide as above and discharge home with home health.   Loralie Champagne 07/04/2012 8:17 AM

## 2012-07-04 NOTE — Progress Notes (Signed)
Sarajane Marek, [place] [phone] 07/04/2012

## 2012-07-04 NOTE — Progress Notes (Signed)
Spoke with pt in regards to nighttime CPAP order, machine at bedside from previous night. pt currently refuses to wear due to coughing/gagging intermittently. Pt verbalizes understanding that RT is available if at any point this changes and needs any assistance.

## 2012-07-04 NOTE — Progress Notes (Signed)
Physical Therapy Treatment Patient Details Name: [name] MRN: [id number] DOB: 10-07-1960 Today's Date: 07/04/2012 Time: 4445-8483 PT Time Calculation (min): 29 min  PT Assessment / Plan / Recommendation  PT Comments   Patient making progress toward PT goals. She continues to be limited by fatigue and is easily distracted during Tx. Pt supervision with PLB technique needing occasional VC to utilize technique, but feels that it is helpful.    Follow Up Recommendations  Supervision - Intermittent     Does the patient have the potential to tolerate intense rehabilitation     Barriers to Discharge        Equipment Recommendations  Rolling walker with 5" wheels    Recommendations for Other Services    Frequency Min 3X/week   Progress towards PT Goals Progress towards PT goals: Progressing toward goals  Plan      Precautions / Restrictions Precautions Precautions: None Restrictions Weight Bearing Restrictions: No   Pertinent Vitals/Pain Pt made no reports of pain during Tx. Sp02 stayed above 90% on RA for entire Tx.    Mobility  Bed Mobility Bed Mobility: Not assessed Transfers Transfers: Sit to Stand;Stand to Sit Sit to Stand: 5: Supervision;From elevated surface;With armrests;From chair/3-in-1 Stand to Sit: 5: Supervision;With armrests;To chair/3-in-1 Details for Transfer Assistance: pt with good technique requiring no cueing Ambulation/Gait Ambulation/Gait Assistance: 4: Min guard Ambulation Distance (Feet): 100 Feet Assistive device: Rolling walker Ambulation/Gait Assistance Details: Sp02 90-94% on RA. Pt made no c/o dizziness or SOB during ambulaition. Pt ambulated well with RW. She required min VC to keep head up while walking.  Gait Pattern: Step-through pattern;Decreased stride length;Wide base of support Gait velocity: decr    Exercises General Exercises - Lower Extremity Ankle Circles/Pumps: AROM;Both;10 reps;Seated Long Arc Quad: AROM;Both;10  reps;Seated Hip ABduction/ADduction: AROM;Both;10 reps;Seated Straight Leg Raises: AROM;Both;10 reps;Seated Hip Flexion/Marching: AROM;Both;10 reps;Seated Sit to Stand:10 reps     PT Goals (current goals can now be found in the care plan section) Acute Rehab PT Goals Time For Goal Achievement: 07/10/12 Potential to Achieve Goals: Good  Visit Information  Last PT Received On: 07/04/12 Assistance Needed: +1 History of Present Illness: Pt admitted with SOB and dizziness    Subjective Data      Cognition  Cognition Arousal/Alertness: Awake/alert Behavior During Therapy: WFL for tasks assessed/performed Overall Cognitive Status: Within Functional Limits for tasks assessed    Balance     End of Session PT - End of Session Equipment Utilized During Treatment: Gait belt Activity Tolerance: Patient limited by fatigue Patient left: in chair;with call bell/phone within reach Nurse Communication: Mobility status   GP     [name], [name] 07/04/2012, 9:21 AM

## 2012-07-04 NOTE — Progress Notes (Signed)
Subjective: Patient reports she slept well on CPAP last night since she slept in her chair. PT has given her exercises to do in the chair, she reports she will do them. Reports doing well, currently talking in sentences on RA.  No other complaints. Objective: Vital signs in last 24 hours: Filed Vitals:   07/03/12 1458 07/03/12 2014 07/04/12 0538 07/04/12 0922  BP: 118/59 107/63 105/72 102/59  Pulse: 96 96 96 103  Temp: 98.1 F (36.7 C) 98.2 F (36.8 C) 98.5 F (36.9 C) 98.2 F (36.8 C)  TempSrc: Oral Axillary Oral Oral  Resp: _0 Height:      Weight:   256 lb 13.4 oz (116.5 kg)   SpO2: 91% 98% 98% 96%   Weight change: -1 lb 4.2 oz (-0.574 kg)  Intake/Output Summary (Last 24 hours) at 07/04/12 1239 Last data filed at 07/04/12 1220  Gross per 24 hour  Intake 1431.42 ml  Output   5225 ml  Net -3793.58 ml   Vitals reviewed.  General: resting in recliner, NAD  HEENT: [place]/at, no scleral icterus  Cardiac: RRR, +S3, 2/6 systolic murmur. Pulm: clear to auscultation bilaterally, no wheezes, rales, or rhonchi  Abd: soft, nontender, nondistended, BS present, obese  Ext: warm and well perfused, 1+ pedal edema, chronic venous ulcers on both legs.  Ulcer on left arm.  Ulcers on dorsal aspect of second and third toes b/l Neuro: alert and oriented X3, grossly neurologically intact, moving all 4 extremities  Skin: skin breakdown to lower extremities   Lab Results: Basic Metabolic Panel:  Recent Labs Lab 07/03/12 1901 07/04/12 0834  NA 140 140  K 3.9 3.3*  CL 103 101  CO2 29 28  GLUCOSE 124* 153*  BUN 31* 28*  CREATININE 1.05 0.93  CALCIUM 8.5 8.9  MG 1.6 1.6   Liver Function Tests:  Recent Labs Lab 06/30/12 1048  AST 38*  ALT 27  ALKPHOS 140*  BILITOT 0.8  PROT 7.3  ALBUMIN 3.2*   CBC:  Recent Labs Lab 06/30/12 1048  WBC 6.1  NEUTROABS 3.0  HGB 12.6  HCT 38.7  MCV 78.2  PLT 235   CBG:  Recent Labs Lab 07/03/12 0644 07/03/12 1100  07/03/12 1631 07/03/12 2148 07/04/12 0605 07/04/12 1138  GLUCAP 91 107* 113* 95 104* 120*   Anemia Panel:  Recent Labs Lab 07/01/12 0455  FERRITIN 18  TIBC 432  IRON 27*   Urinalysis:  Recent Labs Lab 06/30/12 1813  COLORURINE YELLOW  LABSPEC 1.009  PHURINE 5.5  GLUCOSEU NEGATIVE  HGBUR NEGATIVE  BILIRUBINUR NEGATIVE  KETONESUR NEGATIVE  PROTEINUR NEGATIVE  UROBILINOGEN 0.2  NITRITE NEGATIVE  LEUKOCYTESUR NEGATIVE    Medications: I have reviewed the patient's current medications. Scheduled Meds: . fluticasone  2 spray Each Nare Daily  . heparin  5,000 Units Subcutaneous Q8H  . insulin aspart  0-9 Units Subcutaneous TID WC  . pantoprazole  20 mg Oral BID  . potassium chloride  20 mEq Oral Daily  . simvastatin  10 mg Oral q1800  . vitamin B-12  250 mcg Oral Daily   Continuous Infusions: . furosemide (LASIX) infusion 10 mg/hr (07/04/12 0840)   PRN Meds:.acetaminophen, acetaminophen, albuterol, traMADol Assessment/Plan: 52 y.o signigicant PMH presents with weight gain, dypsnea, acute on chronic respiratory failure, acute renal failure and concern for acute on chronic diastolic HF and CRS.  #Acute on chronic diastolic CHF and core pulmonale  -04/2012 echo with EF 42-70% grade 1 diastolic dsyfunction.  Peak pressure 72mHg.  -HF with preserved ejection fraction likely cause of acute on chronic respiratory failure, dyspnea with exertion and weight gain. She has been eating a lot of dill potatoe chips and 1/2 pickles at at home and taking 3 Ibuprofen every 4-6 hours at home for leg wounds for the last 3 weeks  - Consulted CHF who is following and placed on Lasix gtt. Lasix gtt dose was increased 5 to 10 mg/hr per Card yesterday.  Will Continue Lasix gtt drip at 111mhr since we do not know baseline weight. - Strict I &Os and daily weights on Lasix gtt, net -13 L and 15lbs. - will trend BMET, q 12 hours. On KCl 40 mEQ daily. Will replace K and Mg as needed. - Possible  right heart cath per Cardiology in the future  - NO Aspirin due allergies, Avoid NSAIDS  - previously on Torsemide 40 mg daily at home, cardiology following and will restart. Torseminde 6028mID in the next day or two. - Continue PT and CR. #Acute on chronic respiratory failure  -Likely multifactorial and acutely secondary to acute on chronic diastolic HF exacerbation and cor pulmonale with severe pulmonary HTN history. She also has a history of restrictive lung disease and OSA on cpap.  -breathing improved with diuresis  -will ambulate with pulse ox again today.  Yesterday patient was ambulating on RA with pulse ox of 90-94%. -needs to f/u with pulmonary outpatient  #Acute renal failure most likely multifactorial diuresis with Torsemide (outpatient), ARB and increased use of NSAIDS  -Cardiology concerned for CRS. Creatinine on admission was 1.85 trended down to 1.12 -> 1.02 ->1.05-> 0.93. Baseline <0.8  -Will continue to monitor Cr and volume status. # Diabetes Mellitus well-controlled with hemoglobin A1c of 6 05/2012  - Hold metformin  -SSI, monitor cbg  -Fasting glucose has been 91-120 with no insulin needed. #Lower extremities ulcer  -Patient is followed up by wound care as an outpatient.  - Consulted wound care who recommends outpatient wound care and will not aggressively treat inpatient  -prn Ultram for pain  -H/H RN for wound care  #Sleep apnea  -Cpap qhs  #DVT px  -Heparin  #F/E/N  -NSL  -HypoK 3.3 today. will trend BMET. Will change from KCl 20 mEq daily to BID -and given extra dose of 40 mEQ today.  -Mg++ 1.6 today will give Mg 2g IV. -low Na diet  Dispo: Disposition is deferred at this time, awaiting improvement of current medical problems. Anticipated discharge in approximately 1-2 day(s).  The patient does have a current PCP (ZiSandy SalaamarHoyle SauerD), therefore will be requiring OPC follow-up after discharge.  The patient does have transportation limitations that hinder  transportation to clinic appointments.    .Services Needed at time of discharge: Y = Yes, Blank = No PT:   OT:   RN:   Equipment:   Other:     LOS: 4 days   EriJoni ReiningO 07/04/2012, 12:39 PM

## 2012-07-05 LAB — BASIC METABOLIC PANEL
CO2: 28 mEq/L (ref 19–32)
Calcium: 8.8 mg/dL (ref 8.4–10.5)
Chloride: 99 mEq/L (ref 96–112)
Creatinine, Ser: 0.99 mg/dL (ref 0.50–1.10)
Creatinine, Ser: 1.09 mg/dL (ref 0.50–1.10)
GFR calc Af Amer: 67 mL/min — ABNORMAL LOW (ref >90)
GFR calc non Af Amer: 65 mL/min — ABNORMAL LOW (ref >90)
Potassium: 3.6 mEq/L (ref 3.5–5.1)
Sodium: 138 mEq/L (ref 135–145)

## 2012-07-05 LAB — MAGNESIUM
Magnesium: 1.7 mg/dL (ref 1.5–2.5)
Magnesium: 2.2 mg/dL (ref 1.5–2.5)

## 2012-07-05 LAB — GLUCOSE, CAPILLARY
Glucose-Capillary: 101 mg/dL — ABNORMAL HIGH (ref 70–99)
Glucose-Capillary: 115 mg/dL — ABNORMAL HIGH (ref 70–99)

## 2012-07-05 MED ORDER — TORSEMIDE 20 MG PO TABS
60.0000 mg | ORAL_TABLET | Freq: Two times a day (BID) | ORAL | Status: DC
  Administered 2012-07-05: 60 mg via ORAL
  Filled 2012-07-05 (×4): qty 3

## 2012-07-05 MED ORDER — BENZONATATE 100 MG PO CAPS
100.0000 mg | ORAL_CAPSULE | Freq: Three times a day (TID) | ORAL | Status: DC | PRN
  Administered 2012-07-06 – 2012-07-07 (×4): 100 mg via ORAL
  Filled 2012-07-05 (×4): qty 1

## 2012-07-05 MED ORDER — SODIUM CHLORIDE 0.9 % IJ SOLN
3.0000 mL | Freq: Two times a day (BID) | INTRAMUSCULAR | Status: DC
  Administered 2012-07-05 – 2012-07-07 (×4): 3 mL via INTRAVENOUS

## 2012-07-05 MED ORDER — MAGNESIUM SULFATE 40 MG/ML IJ SOLN
2.0000 g | Freq: Once | INTRAMUSCULAR | Status: AC
  Administered 2012-07-05: 2 g via INTRAVENOUS
  Filled 2012-07-05: qty 50

## 2012-07-05 MED ORDER — TORSEMIDE 20 MG PO TABS
40.0000 mg | ORAL_TABLET | Freq: Every day | ORAL | Status: DC
  Filled 2012-07-05: qty 2

## 2012-07-05 MED ORDER — POTASSIUM CHLORIDE CRYS ER 20 MEQ PO TBCR
40.0000 meq | EXTENDED_RELEASE_TABLET | Freq: Once | ORAL | Status: AC
  Administered 2012-07-05: 40 meq via ORAL
  Filled 2012-07-05: qty 2

## 2012-07-05 NOTE — Progress Notes (Signed)
Pt now on RA by night RN  stat 92-93% no SOB will continue to monitor

## 2012-07-05 NOTE — Progress Notes (Signed)
Review of tele show 9 beat run of VT at 00:09:22 7/2.Cards  start pt on 40 mEp K daily, IM order one time 40 mEp K and IV mg.   K level 3.2 MG level 1.7  Will continue to monitor

## 2012-07-05 NOTE — Research (Signed)
Pt foley removed after d/c of lasix drip. Peri care down by PT after remove

## 2012-07-05 NOTE — Progress Notes (Signed)
CARDIAC REHAB PHASE I   PRE:  Rate/Rhythm: 105 ST    BP: sitting 96/64    SaO2: 89-90 RA  MODE:  Ambulation: 460 ft   POST:  Rate/Rhythm: 113 ST    BP: sitting 105/70     SaO2: 93 RA   Pt tolerated fairly well with RW, assist x1 (supervision). Some SOB noted but pt denies. She only c/o dry mouth. SaO2 better with walking than at rest in recliner.  [id number]  Josephina Shih [place] CES, ACSM 07/05/2012 3:16 PM

## 2012-07-05 NOTE — Progress Notes (Signed)
  Date: 07/05/2012  Patient name: [name]  Medical record number: [id number]  Date of birth: 11/04/1960   This patient has been seen and the plan of care was discussed with the house staff. Please see their note for complete details. I concur with their findings with the following additions/corrections:  Feels well this morning. SOB is improved. Working with PT. Noted to have a 9 beat run of nonsustained Vtach, asymptomatic. She is getting K and Mg replacement. Off lasix gtt. She is -18 L since admission!! Change to Demadex 60 mg PO bid. Follow BMP, Mg every 8 hours and replace electrolytes. Expect D/C tomorrow.  Dominic Pea, DO 07/05/2012, 2:57 PM

## 2012-07-05 NOTE — Progress Notes (Signed)
Advanced Heart Failure Rounding Note   Subjective:    [name] is a 52 y.o. 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   We saw her in the HF Clinic for the first time earlier this week. She was admitted to the hospital in 4/14 when she was diagnosed with HF and PAH. Echo as below. On admit to hospital in 4/14 weight was 297. Got down to 255. But in clinic weight was 270 despite recent increases in lasix. Now 271. PH felt to be due to OHS, OSA and diastolic HF. We switched her to torsemide and ordered labs. I walked her round clinic with pulse ox sats 97%->93%   However over past few days weight continues to go up and she is more dyspneic now with Class IV symptoms. Creatine also continues to climb from 1.3-> 1.85. Admitted for management. Of note, she has been taking ibuprofen 600 mg 3-4x/day for diabetic foot ulcer pain as prescribed by Wound Care.   Echo 4/14.  - Left ventricle: The cavity size was normal. Wall thickness was increased in a pattern of mild LVH. Systolic function was normal. The estimated ejection fraction was in the range of 55% to 60%. Wall motion was normal; there were no regional wall motion abnormalities. Septal wall flattening consistent with elevated right ventricular pressure/volume. Doppler parameters are consistent with abnormal left ventricular relaxation (grade 1 diastolic dysfunction). - Left atrium: The atrium was mildly dilated. - Right ventricle: The cavity size was severely dilated. Systolic function was severely reduced. - Right atrium: The atrium was moderately dilated. - Pulmonary arteries: Systolic pressure was severely increased. PA peak pressure: 71m Hg (S). - Pericardium, extracardiac: A moderate, free-flowing pericardial effusion was identified posterior to the  heart and along the left ventricular free wall. There was no evidence of hemodynamic compromise.   Lasix gtt 173mhr. Weight down 5 lbs (total 20 lbs). 24 hr I/O 5.2 L. Complaints of foot sores so not able to walk much. Refuses to wear CPAP machine. Denies SOB, CP or orthopnea. Cr up to 1.2.  Objective:   Weight Range:  Vital Signs:   Temp:  [97.5 F (36.4 C)-98.2 F (36.8 C)] 97.5 F (36.4 C) (07/02 0606) Pulse Rate:  [98-105] 98 (07/02 0606) Resp:  [18-20] 18 (07/02 0606) BP: (102-113)/(59-71) 113/68 mmHg (07/02 0606) SpO2:  [90 %-96 %] 90 % (07/02 0606) Weight:  [251 lb 4.8 oz (113.989 kg)] 251 lb 4.8 oz (113.989 kg) (07/02 0606) Last BM Date: 07/04/12  Weight change: Filed Weights   07/03/12 0540 07/04/12 0538 07/05/12 0606  Weight: 258 lb 1.6 oz (117.073 kg) 256 lb 13.4 oz (116.5 kg) 251 lb 4.8 oz (113.989 kg)    Intake/Output:   Intake/Output Summary (Last 24 hours) at 07/05/12 0739 Last data filed at 07/05/12 [id number] per 24 hour  Intake    922 ml  Output   6175 ml  Net  -5253 ml     Physical Exam: General: Morbidly obese. No resp difficulty; sitting on side of bed HEENT: normal Neck: supple. JVP difficult to assess d/t body habitus. Carotids 2+ bilat; no bruits. No lymphadenopathy or thryomegaly appreciated. Cor: PMI nonpalpable. +RV lift; Regular rate & rhythm. 2/6 TR +R s3 Lungs: inspiratory wheezes Abdomen: soft, nontender, nondistended. No hepatosplenomegaly. No bruits or masses. Good bowel sounds. Extremities: no cyanosis, clubbing,  rash, 1+ edema Neuro: alert & orientedx3, cranial nerves grossly intact. moves all 4 extremities w/o difficulty. Affect pleasant  Telemetry: SR 90-100s  Labs: Basic Metabolic Panel:  Recent Labs Lab 07/02/12 1936 07/03/12 0921 07/03/12 1901 07/04/12 0834 07/04/12 1733  NA 136 139 140 140 139  K 3.6 3.2* 3.9 3.3* 3.5  CL 96 99 103 101 99  CO2 _0 GLUCOSE 87 172* 124* 153* 93  BUN 41* 34* 31* 28*  24*  CREATININE 1.12* 1.02 1.05 0.93 1.20*  CALCIUM 8.6 8.6 8.5 8.9 9.0  MG 1.9 1.8 1.6 1.6 1.9    Liver Function Tests:  Recent Labs Lab 06/30/12 1048  AST 38*  ALT 27  ALKPHOS 140*  BILITOT 0.8  PROT 7.3  ALBUMIN 3.2*   No results found for this basename: LIPASE, AMYLASE,  in the last 168 hours No results found for this basename: AMMONIA,  in the last 168 hours  CBC:  Recent Labs Lab 06/30/12 1048  WBC 6.1  NEUTROABS 3.0  HGB 12.6  HCT 38.7  MCV 78.2  PLT 235    Cardiac Enzymes: No results found for this basename: CKTOTAL, CKMB, CKMBINDEX, TROPONINI,  in the last 168 hours  BNP: BNP (last 3 results)  Recent Labs  02/11/12 1519 02/14/12 0700  PROBNP 3892.0* 3408.0*     Other results:    Imaging: No results found.   Medications:     Scheduled Medications: . fluticasone  2 spray Each Nare Daily  . heparin  5,000 Units Subcutaneous Q8H  . insulin aspart  0-9 Units Subcutaneous TID WC  . pantoprazole  20 mg Oral BID  . potassium chloride  40 mEq Oral Daily  . simvastatin  10 mg Oral q1800  . vitamin B-12  250 mcg Oral Daily    Infusions: . furosemide (LASIX) infusion 10 mg/hr (07/04/12 0840)    PRN Medications: acetaminophen, acetaminophen, albuterol, traMADol   Assessment:   1) Acute and chronic respiratory failure  2) Cor pulmonale  3) Pulmonary HTN  4) A/C diastolic HF  5) Morbid obesity  6) A/C renal failure  7) Non-healing wounds  8) Mental retardation  9) Microcytic anemia    Plan/Discussion:    Weight down another 5 lbs (total 25 lbs). Patient appears to be dry, bump in Cr to 1.2 and urine dark. Will stop lasix gtt today and hold PO diuretics. Hopefully home tomorrow with [facility] RN and close follow up in clinic  Continue to work with PT and CR.  Length of Stay: 5   Rande Brunt 07/05/2012, 7:39 AM  Advanced Heart Failure Team Pager [phone] (M-F; 7a - 4p)  Please contact [facility] for night-coverage after  hours (4p -7a ) and weekends on [email or web address]  Patient seen and examined with Junie Bame, NP. We discussed all aspects of the encounter. I agree with the assessment and plan as stated above.   Patient still appears to have some volume on board. Will continue IV lasix. Watch renal function closely. Once diuresed will likely need RHC to assess relative R and L sided components to pHTN but this can be done as outpatient. We will continue to follow.  Daniel Bensimhon,MD 7:54 AM

## 2012-07-05 NOTE — Progress Notes (Signed)
Physical Therapy Treatment Patient Details Name: [name] MRN: [id number] DOB: 01-Aug-1960 Today's Date: 07/05/2012 Time: 5366-4403 PT Time Calculation (min): 24 min  PT Assessment / Plan / Recommendation  PT Comments   Pt making progress toward all PT goals. Pt increased ambulation distance with fewer rest breaks during today's treatment. Pt requires fewer cues to safely perform transfers and gait (on level surface)compared to previous PT sessions. Pt able to negotiate 5 stairs today with a single tip cane. Pt continues to get stronger. D/c plan remains appropriate.    Follow Up Recommendations  Supervision - Intermittent     Does the patient have the potential to tolerate intense rehabilitation     Barriers to Discharge        Equipment Recommendations  Rolling walker with 5" wheels    Recommendations for Other Services    Frequency Min 3X/week   Progress towards PT Goals Progress towards PT goals: Progressing toward goals  Plan      Precautions / Restrictions Precautions Precautions: None Restrictions Weight Bearing Restrictions: No   Pertinent Vitals/Pain Patient made no reports of pain. Sp02 Before:97% RA           During:88% RA X 1; >90% remainder of Tx.           After:95% RA    Mobility  Bed Mobility Bed Mobility: Not assessed Transfers Transfers: Sit to Stand;Stand to Sit Sit to Stand: 6: Modified independent (Device/Increase time);With upper extremity assist;With armrests;From chair/3-in-1 Stand to Sit: 6: Modified independent (Device/Increase time);With upper extremity assist;With armrests;To chair/3-in-1 Details for Transfer Assistance: pt with good technique requiring no cueing Ambulation/Gait Ambulation/Gait Assistance: 5: Supervision Ambulation Distance (Feet): 200 Feet Assistive device: Rolling walker Ambulation/Gait Assistance Details: Sp02 90-95% with one drop to 88%. Pt took standing rest break and began PLB, 02 sats returned to >90%. Pt has  decreased gate speed but ambulates well with RW. Gait Pattern: Step-through pattern;Decreased stride length;Wide base of support Gait velocity: decr Stairs: Yes Stairs Assistance: 4: Min assist Stairs Assistance Details (indicate cue type and reason): VC for sequencing with STC. Min Guarding when descending stairs for safety. Stair Management Technique: One rail Left;Step to pattern;Forwards;With cane Number of Stairs: 5    Exercises          PT Goals (current goals can now be found in the care plan section) Acute Rehab PT Goals Time For Goal Achievement: 07/10/12 Potential to Achieve Goals: Good  Visit Information  Last PT Received On: 07/05/12 Assistance Needed: +1 History of Present Illness: Pt admitted with SOB and dizziness    Subjective Data      Cognition  Cognition Arousal/Alertness: Awake/alert Behavior During Therapy: WFL for tasks assessed/performed Overall Cognitive Status: Within Functional Limits for tasks assessed    Balance     End of Session PT - End of Session Equipment Utilized During Treatment: Gait belt Activity Tolerance: Patient tolerated treatment well Patient left: in chair;with call bell/phone within reach Nurse Communication: Mobility status   GP     [name], [place] 07/05/2012, 12:57 PM

## 2012-07-05 NOTE — Progress Notes (Signed)
Subjective: Pt had an episode of nonsustained vtach 9 beats.  K+ was 3.2 this AM and Mg was 1.7 both were replaced.  Patient reports feeling well. Slept well last night on CPAP in bed.  Reports a cough that started this morning. Objective: Vital signs in last 24 hours: Filed Vitals:   07/04/12 2357 07/05/12 0606 07/05/12 1040 07/05/12 1341  BP:  113/68  98/65  Pulse:  98  99  Temp:  97.5 F (36.4 C)  98.2 F (36.8 C)  TempSrc:  Oral  Oral  Resp: _0 Height:      Weight:  251 lb 4.8 oz (113.989 kg)    SpO2:  90% 92% 93%   Weight change: -5 lb 8.6 oz (-2.511 kg)  Intake/Output Summary (Last 24 hours) at 07/05/12 1605 Last data filed at 07/05/12 1523  Gross per 24 hour  Intake 875.33 ml  Output   3525 ml  Net -2649.67 ml   Vitals reviewed.  General: resting in recliner, NAD  Cardiac: RRR,  2/6 systolic murmur.  Pulm: clear to auscultation bilaterally, no wheezes, rales, or rhonchi  Abd: soft, nontender, nondistended, BS present, obese  Ext: warm and well perfused, 1+ pedal edema, chronic venous ulcers on both legs.  Neuro: alert and oriented X3, grossly neurologically intact, moving all 4 extremities  Skin: skin breakdown to lower extremities   Lab Results: Basic Metabolic Panel:  Recent Labs Lab 07/04/12 1733 07/05/12 1030  NA 139 138  K 3.5 3.2*  CL 99 97  CO2 30 28  GLUCOSE 93 164*  BUN 24* 24*  CREATININE 1.20* 0.99  CALCIUM 9.0 8.8  MG 1.9 1.7   Liver Function Tests:  Recent Labs Lab 06/30/12 1048  AST 38*  ALT 27  ALKPHOS 140*  BILITOT 0.8  PROT 7.3  ALBUMIN 3.2*   CBC:  Recent Labs Lab 06/30/12 1048  WBC 6.1  NEUTROABS 3.0  HGB 12.6  HCT 38.7  MCV 78.2  PLT 235   CBG:  Recent Labs Lab 07/04/12 0605 07/04/12 1138 07/04/12 1622 07/04/12 2117 07/05/12 0634 07/05/12 1134  GLUCAP 104* 120* 117* 156* 101* 115*   Anemia Panel:  Recent Labs Lab 07/01/12 0455  FERRITIN 18  TIBC 432  IRON 27*    Urinalysis:  Recent Labs Lab 06/30/12 1813  COLORURINE YELLOW  LABSPEC 1.009  PHURINE 5.5  GLUCOSEU NEGATIVE  HGBUR NEGATIVE  BILIRUBINUR NEGATIVE  KETONESUR NEGATIVE  PROTEINUR NEGATIVE  UROBILINOGEN 0.2  NITRITE NEGATIVE  LEUKOCYTESUR NEGATIVE    Medications: I have reviewed the patient's current medications. Scheduled Meds: . fluticasone  2 spray Each Nare Daily  . heparin  5,000 Units Subcutaneous Q8H  . insulin aspart  0-9 Units Subcutaneous TID WC  . magnesium sulfate 1 - 4 g bolus IVPB  2 g Intravenous Once  . pantoprazole  20 mg Oral BID  . potassium chloride  40 mEq Oral Daily  . simvastatin  10 mg Oral q1800  . sodium chloride  3 mL Intravenous Q12H  . torsemide  60 mg Oral BID  . vitamin B-12  250 mcg Oral Daily   Continuous Infusions:  PRN Meds:.acetaminophen, acetaminophen, albuterol, benzonatate, traMADol Assessment/Plan: Principal Problem:   Acute on chronic diastolic heart failure Active Problems:   SLEEP APNEA, OBSTRUCTIVE   Venous stasis ulcer   Type II or unspecified type diabetes mellitus with neurological manifestations, not stated as uncontrolled(250.60)   CHF exacerbation   Cor pulmonale, chronic  Restrictive lung disease   Acute renal failure   Acute-on-chronic respiratory failure  52 y.o signigicant PMH presents with weight gain, dypsnea, acute on chronic respiratory failure, acute renal failure and concern for acute on chronic diastolic HF and CRS.  #Acute on chronic diastolic CHF and core pulmonale  -04/2012 echo with EF 53-96% grade 1 diastolic dsyfunction. Peak pressure 60mHg.  -HF with preserved ejection fraction likely cause of acute on chronic respiratory failure, dyspnea with exertion and weight gain. She has been eating a lot of dill potatoe chips and 1/2 pickles at at home and taking 3 Ibuprofen every 4-6 hours at home for leg wounds for the last 3 weeks  - Consulted CHF who is following and placed on Lasix gtt. Lasix gtt dose  was increased 5 to 10 mg/hr per Card. And D/Ced today - Strict I &Os and daily weights on Lasix gtt, net -18 L and 20lbs.  - will trend BMET, q 8 hours after episode of Vtach. On KCl 40 mEQ daily. Will replace K and Mg as needed.  - Possible right heart cath per Cardiology in the future  - NO Aspirin due allergies, Avoid NSAIDS  - Changed to Torsemide 60 mg bid, cardiology following. - Continue PT and CR.  #Acute on chronic respiratory failure  -Likely multifactorial and acutely secondary to acute on chronic diastolic HF exacerbation and cor pulmonale with severe pulmonary HTN history. She also has a history of restrictive lung disease and OSA on cpap.  -breathing improved with diuresis  -Continue to monitor O2 sat with ambulation -needs to f/u with pulmonary outpatient  #Acute renal failure most likely multifactorial diuresis with Torsemide (outpatient), ARB and increased use of NSAIDS  -Cardiology concerned for CRS. Creatinine on admission was 1.85 trended down to 1.12 -> 1.02 ->1.05-> 0.93->1.2->0.99. Baseline <0.8  -Will continue to monitor Cr and volume status.  # Diabetes Mellitus well-controlled with hemoglobin A1c of 6 05/2012  - Hold metformin  -SSI, monitor cbg  -Fasting glucose has been 100-120 with no insulin needed.  #Lower extremities ulcer  -Patient is followed up by wound care as an outpatient.  - Consulted wound care who recommends outpatient wound care and will not aggressively treat inpatient  -prn Ultram for pain  -H/H RN for wound care  #Sleep apnea  -Cpap qhs  #DVT px  -Heparin  #F/E/N  -NSL  -HypoK 3.2 today. will trend BMET Q8. Continue KCl 40 mEq daily to BID -and given extra dose of 40 mEQ given today.  -Mg++ 1.7 today given Mg 2g IV.  -low Na diet  Dispo: Disposition is deferred at this time, awaiting improvement of current medical problems. Anticipated discharge in approximately 1-2 day(s).  The patient does have a current PCP (Sandy Salaam CHoyle Sauer MD),  therefore will be requiring OPC follow-up after discharge.  The patient does have transportation limitations that hinder transportation to clinic appointments.   .Services Needed at time of discharge: Y = Yes, Blank = No PT:   OT:   RN:   Equipment:   Other:     LOS: 5 days   EJoni Reining DO 07/05/2012, 4:05 PM

## 2012-07-06 DIAGNOSIS — R05 Cough: Secondary | ICD-10-CM

## 2012-07-06 LAB — BASIC METABOLIC PANEL
BUN: 21 mg/dL (ref 6–23)
CO2: 30 mEq/L (ref 19–32)
CO2: 28 mEq/L (ref 19–32)
Calcium: 8.8 mg/dL (ref 8.4–10.5)
Calcium: 8.9 mg/dL (ref 8.4–10.5)
Calcium: 8.7 mg/dL (ref 8.4–10.5)
Chloride: 97 mEq/L (ref 96–112)
Chloride: 98 mEq/L (ref 96–112)
Chloride: 101 mEq/L (ref 96–112)
Creatinine, Ser: 1.04 mg/dL (ref 0.50–1.10)
Creatinine, Ser: 1 mg/dL (ref 0.50–1.10)
GFR calc Af Amer: 81 mL/min — ABNORMAL LOW (ref >90)
GFR calc Af Amer: 71 mL/min — ABNORMAL LOW (ref >90)
GFR calc non Af Amer: 61 mL/min — ABNORMAL LOW (ref >90)
Glucose, Bld: 117 mg/dL — ABNORMAL HIGH (ref 70–99)
Sodium: 136 mEq/L (ref 135–145)

## 2012-07-06 LAB — CBC
MCHC: 31.3 g/dL (ref 30.0–36.0)
MCV: 80.6 fL (ref 78.0–100.0)
Platelets: 271 10*3/uL (ref 150–400)
RDW: 21.3 % — ABNORMAL HIGH (ref 11.5–15.5)
WBC: 7.4 10*3/uL (ref 4.0–10.5)

## 2012-07-06 LAB — GLUCOSE, CAPILLARY
Glucose-Capillary: 163 mg/dL — ABNORMAL HIGH (ref 70–99)
Glucose-Capillary: 117 mg/dL — ABNORMAL HIGH (ref 70–99)

## 2012-07-06 LAB — MAGNESIUM
Magnesium: 2.4 mg/dL (ref 1.5–2.5)
Magnesium: 1.9 mg/dL (ref 1.5–2.5)

## 2012-07-06 MED ORDER — TORSEMIDE 20 MG PO TABS: 80.0000 mg | ORAL_TABLET | Freq: Every day | ORAL | Status: DC

## 2012-07-06 MED ORDER — TORSEMIDE 20 MG PO TABS
80.0000 mg | ORAL_TABLET | Freq: Every day | ORAL | Status: DC
  Administered 2012-07-06 – 2012-07-07 (×2): 80 mg via ORAL
  Filled 2012-07-06: qty 4

## 2012-07-06 NOTE — Progress Notes (Signed)
Subjective: Patient reported she did not sleep too well last night.  She had a cough that kept her up.  She did request medication and the nurse gave Tessalon pearls and cough improved.  She denies any SOB.  Reports otherwise feeling well.  No episodes of palpitation or chest pain.  Afebrile. Objective: Vital signs in last 24 hours: Filed Vitals:   07/06/12 0527 07/06/12 0836 07/06/12 0946 07/06/12 1443  BP: 116/27 126/81 111/74 91/60  Pulse: 94 107 101 101  Temp: 98.2 F (36.8 C)   98.4 F (36.9 C)  TempSrc: Oral   Oral  Resp: 19   20  Height:      Weight: 249 lb (112.946 kg)     SpO2: 100%   94%   Weight change: -2 lb 4.8 oz (-1.043 kg)  Intake/Output Summary (Last 24 hours) at 07/06/12 1841 Last data filed at 07/06/12 1741  Gross per 24 hour  Intake    943 ml  Output   3251 ml  Net  -2308 ml   Vitals reviewed.  General: resting in bed with CPAP on, NAD  Cardiac: RRR, 2/6 systolic murmur.  Pulm: clear to auscultation bilaterally, no wheezes, rales, or rhonchi  Abd: soft, nontender, nondistended, BS present, obese  Ext: warm and well perfused, 1+ pedal edema, chronic venous ulcers on both legs.  Neuro: alert and oriented X3, grossly neurologically intact, moving all 4 extremities  Skin: skin breakdown to lower extremities   Lab Results: Basic Metabolic Panel:  Recent Labs Lab 07/06/12 0918 07/06/12 1431  NA 138 139  K 3.6 3.9  CL 98 101  CO2 28 28  GLUCOSE 160* 117*  BUN 22 21  CREATININE 1.04 1.00  CALCIUM 8.9 8.7  MG 1.9 1.9   Liver Function Tests:  Recent Labs Lab 06/30/12 1048  AST 38*  ALT 27  ALKPHOS 140*  BILITOT 0.8  PROT 7.3  ALBUMIN 3.2*   CBC:  Recent Labs Lab 06/30/12 1048 07/06/12 0918  WBC 6.1 7.4  NEUTROABS 3.0  --   HGB 12.6 13.0  HCT 38.7 41.5  MCV 78.2 80.6  PLT 235 271   CBG:  Recent Labs Lab 07/05/12 1134 07/05/12 1613 07/05/12 2113 07/06/12 0625 07/06/12 1108 07/06/12 1609  GLUCAP 115* 140* 101* 93 163*  115*   Anemia Panel:  Recent Labs Lab 07/01/12 0455  FERRITIN 18  TIBC 432  IRON 27*    Urinalysis:  Recent Labs Lab 06/30/12 1813  COLORURINE YELLOW  LABSPEC 1.009  PHURINE 5.5  GLUCOSEU NEGATIVE  HGBUR NEGATIVE  BILIRUBINUR NEGATIVE  KETONESUR NEGATIVE  PROTEINUR NEGATIVE  UROBILINOGEN 0.2  NITRITE NEGATIVE  LEUKOCYTESUR NEGATIVE   Medications: I have reviewed the patient's current medications. Scheduled Meds: . fluticasone  2 spray Each Nare Daily  . heparin  5,000 Units Subcutaneous Q8H  . insulin aspart  0-9 Units Subcutaneous TID WC  . pantoprazole  20 mg Oral BID  . potassium chloride  40 mEq Oral Daily  . simvastatin  10 mg Oral q1800  . sodium chloride  3 mL Intravenous Q12H  . torsemide  80 mg Oral Daily  . vitamin B-12  250 mcg Oral Daily   Continuous Infusions:  PRN Meds:.acetaminophen, acetaminophen, albuterol, benzonatate, traMADol Assessment/Plan: 52 y.o signigicant PMH presents with weight gain, dypsnea, acute on chronic respiratory failure, acute renal failure and concern for acute on chronic diastolic HF and CRS.  #Acute on chronic diastolic CHF and core pulmonale  -04/2012 echo with  EF 63-14% grade 1 diastolic dsyfunction. Peak pressure 3mHg. HF with preserved ejection fraction likely cause of acute on chronic respiratory failure, dyspnea with exertion and weight gain. She has been eating a lot of dill potatoe chips and 1/2 pickles at at home and taking 3 Ibuprofen every 4-6 hours at home for leg wounds for the last 3 weeks  - Consulted CHF who is following and placed on Lasix gtt. Lasix gtt dose was increased 5 to 10 mg/hr per Card. And D/Ced 7/2 switched to Torsemide.  Per cardio lower dose to 881mdaily. - Strict I &Os and daily weights on Toursemide, net -19L and 22 lbs.  - will trend BMET, q 8 hours after episode of Vtach. On KCl 40 mEQ daily. Will replace K and Mg as needed.  - Possible right heart cath per Cardiology in the future  - NO  Aspirin due allergies, Avoid NSAIDS  - Continue PT and CR.  #Acute on chronic respiratory failure  -Likely multifactorial and acutely secondary to acute on chronic diastolic HF exacerbation and cor pulmonale with severe pulmonary HTN history. She also has a history of restrictive lung disease and OSA on cpap.  -breathing improved with diuresis  -Continue to monitor O2 sat with ambulation  -needs to f/u with pulmonary outpatient  #Acute renal failure most likely multifactorial diuresis with Torsemide (outpatient), ARB and increased use of NSAIDS  -Cardiology concerned for CRS. Creatinine on admission was 1.85 trended down to ~1.0. Baseline <0.8  -Will continue to monitor Cr and volume status.  # Diabetes Mellitus well-controlled with hemoglobin A1c of 6 05/2012  - Hold metformin  -SSI, monitor cbg  -Fasting glucose has been 100-120 with no insulin needed.  #Lower extremities ulcer  -Patient is followed up by wound care as an outpatient.  - Consulted wound care who recommends outpatient wound care and will not aggressively treat inpatient  -prn Ultram for pain  -H/H RN for wound care  #Sleep apnea  -Cpap qhs  #DVT px  -Heparin  #F/E/N  -NSL  -HypoK will trend BMET Q8. Continue KCl 40 mEq daily to BID  -Mg++ 1.9  -low Na diet  Dispo: Disposition is deferred at this time, awaiting improvement of current medical problems. Anticipated discharge in approximately 1 day(s).  The patient does have a current PCP (ZSandy SalaamCaHoyle SauerMD), therefore will be requiring OPC follow-up after discharge.  The patient does have transportation limitations that hinder transportation to clinic appointments.   .Services Needed at time of discharge: Y = Yes, Blank = No PT:   OT:   RN:   Equipment:   Other:     LOS: 6 days   ErJoni ReiningDO 07/06/2012, 6:41 PM

## 2012-07-06 NOTE — Progress Notes (Signed)
Advanced Heart Failure Rounding Note   Subjective:    [name] is a 52 y.o. 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   We saw her in the HF Clinic for the first time earlier this week. She was admitted to the hospital in 4/14 when she was diagnosed with HF and PAH. Echo as below. On admit to hospital in 4/14 weight was 297. Got down to 255. But in clinic weight was 270 despite recent increases in lasix. Now 271. PH felt to be due to OHS, OSA and diastolic HF. We switched her to torsemide and ordered labs. I walked her round clinic with pulse ox sats 97%->93%   However over past few days weight continues to go up and she is more dyspneic now with Class IV symptoms. Creatine also continues to climb from 1.3-> 1.85. Admitted for management. Of note, she has been taking ibuprofen 600 mg 3-4x/day for diabetic foot ulcer pain as prescribed by Wound Care.   Echo 4/14.  - Left ventricle: The cavity size was normal. Wall thickness was increased in a pattern of mild LVH. Systolic function was normal. The estimated ejection fraction was in the range of 55% to 60%. Wall motion was normal; there were no regional wall motion abnormalities. Septal wall flattening consistent with elevated right ventricular pressure/volume. Doppler parameters are consistent with abnormal left ventricular relaxation (grade 1 diastolic dysfunction). - Left atrium: The atrium was mildly dilated. - Right ventricle: The cavity size was severely dilated. Systolic function was severely reduced. - Right atrium: The atrium was moderately dilated. - Pulmonary arteries: Systolic pressure was severely increased. PA peak pressure: 75m Hg (S). - Pericardium, extracardiac: A moderate, free-flowing pericardial effusion was identified posterior to the  heart and along the left ventricular free wall. There was no evidence of hemodynamic compromise.    She continued on IV lasix. Weight down 2 lbs (total 22 lbs). 24 hr I/O 1.6 L.      Objective:   Weight Range:  Vital Signs:   Temp:  [97.2 F (36.2 C)-98.2 F (36.8 C)] 98.2 F (36.8 C) (07/03 0527) Pulse Rate:  [94-107] 94 (07/03 0527) Resp:  [18-20] 19 (07/03 0527) BP: (98-116)/(27-87) 116/27 mmHg (07/03 0527) SpO2:  [92 %-100 %] 100 % (07/03 0527) Weight:  [249 lb (112.946 kg)] 249 lb (112.946 kg) (07/03 0527) Last BM Date: 07/05/12  Weight change: Filed Weights   07/04/12 0538 07/05/12 0606 07/06/12 0527  Weight: 256 lb 13.4 oz (116.5 kg) 251 lb 4.8 oz (113.989 kg) 249 lb (112.946 kg)    Intake/Output:   Intake/Output Summary (Last 24 hours) at 07/06/12 0811 Last data filed at 07/06/12 0104  Gross per 24 hour  Intake 933.33 ml  Output   2600 ml  Net -1666.67 ml     Physical Exam: General: Morbidly obese. No resp difficulty; sitting on side of bed HEENT: normal Neck: supple. JVP difficult to assess d/t body habitus. Carotids 2+ bilat; no bruits. No lymphadenopathy or thryomegaly appreciated. Cor: PMI nonpalpable. +RV lift; Regular rate & rhythm. 2/6 TR +R s3 Lungs: inspiratory wheezes on CPAP Abdomen: soft, nontender, nondistended. No hepatosplenomegaly. No bruits or masses. Good bowel sounds. Extremities: no cyanosis, clubbing, rash, 1+ edema Neuro: alert & orientedx3, cranial nerves grossly intact. moves all 4 extremities w/o difficulty. Affect pleasant  Telemetry: SR 90-100s  Labs:  Basic Metabolic Panel:  Recent Labs Lab 07/04/12 0834 07/04/12 1733 07/05/12 1030 07/05/12 1556 07/06/12 0105  NA 140 139 138 138 136  K 3.3* 3.5 3.2* 3.6 3.5  CL 101 99 97 99 97  CO2 _0 GLUCOSE 153* 93 164* 121* 91  BUN 28* 24* 24* 23 21  CREATININE 0.93 1.20* 0.99 1.09 0.93  CALCIUM 8.9 9.0 8.8 8.6 8.8  MG 1.6 1.9 1.7 2.2 2.4    Liver Function  Tests:  Recent Labs Lab 06/30/12 1048  AST 38*  ALT 27  ALKPHOS 140*  BILITOT 0.8  PROT 7.3  ALBUMIN 3.2*   No results found for this basename: LIPASE, AMYLASE,  in the last 168 hours No results found for this basename: AMMONIA,  in the last 168 hours  CBC:  Recent Labs Lab 06/30/12 1048  WBC 6.1  NEUTROABS 3.0  HGB 12.6  HCT 38.7  MCV 78.2  PLT 235    Cardiac Enzymes: No results found for this basename: CKTOTAL, CKMB, CKMBINDEX, TROPONINI,  in the last 168 hours  BNP: BNP (last 3 results)  Recent Labs  02/11/12 1519 02/14/12 0700  PROBNP 3892.0* 3408.0*     Other results:    Imaging: No results found.   Medications:     Scheduled Medications: . fluticasone  2 spray Each Nare Daily  . heparin  5,000 Units Subcutaneous Q8H  . insulin aspart  0-9 Units Subcutaneous TID WC  . pantoprazole  20 mg Oral BID  . potassium chloride  40 mEq Oral Daily  . simvastatin  10 mg Oral q1800  . sodium chloride  3 mL Intravenous Q12H  . torsemide  60 mg Oral BID  . vitamin B-12  250 mcg Oral Daily    Infusions:    PRN Medications: acetaminophen, acetaminophen, albuterol, benzonatate, traMADol   Assessment:   1) Acute and chronic respiratory failure  2) Cor pulmonale  3) Pulmonary HTN  4) A/C diastolic HF  5) Morbid obesity  6) A/C renal failure  7) Non-healing wounds  8) Mental retardation  9) Microcytic anemia    Plan/Discussion:   Volume status much better. Overall weight down 22 pounds. Decrease torsemide to 80 mg daily. Renal function stable.   Hopefully home tomorrow. Follow up in HF clinic next week.    Length of Stay: 6   CLEGG,AMY 07/06/2012, 8:11 AM  Advanced Heart Failure Team Pager [phone] (M-F; 7a - 4p)  Please contact [facility] for night-coverage after hours (4p -7a ) and weekends on [email or web address]  Patient seen and examined with Darrick Grinder, NP. We discussed all aspects of the encounter. I agree with the assessment and  plan as stated above. Volume status much improved. Would change demadex to 80 po daily. If weight and renal function stable can go home tomorrow with close outpatient f/u. Will likely need RHC as outpatient to sort out components of R and L pHTN.   Daniel Bensimhon,MD 8:53 AM

## 2012-07-06 NOTE — Progress Notes (Signed)
  Date: 07/06/2012  Patient name: [name]  Medical record number: [id number]  Date of birth: 09-18-1960   This patient has been seen and the plan of care was discussed with the house staff. Please see their note for complete details. I concur with their findings with the following additions/corrections:  Feels better today. Some clear sputum production. Afebrile. Lung exam is CTA bilat.   She has cor pulmonale with severe pulmonary HTN. Now on torsemide 80 mg daily. She has diuresed -20 L since admission. Watch BMP, Mg daily now. Likely D/C tomorrow.  Dominic Pea, DO 07/06/2012, 1:24 PM

## 2012-07-06 NOTE — Progress Notes (Signed)
[name], [place] [phone] 07/06/2012

## 2012-07-06 NOTE — Progress Notes (Signed)
Patient had a small run of ST and heart rate increased as high as 147. Patient was asymptomatic at the time. No complaints and no signs or symptoms of distress. PA notified. No new orders given. BP is 115/59 and HR is 108. Strip is flagged in chart. Will continue to monitor patient for further changes in condition.

## 2012-07-06 NOTE — Progress Notes (Signed)
CARDIAC REHAB PHASE I   PRE:  Rate/Rhythm: 103 ST    BP: sitting 115/68    SaO2: 95 RA  MODE:  Ambulation: 300 ft   POST:  Rate/Rhythm: 111ST    BP: sitting 116/70     SaO2: 93 RA  Pt fairly steady with RW. Some SOB, which she sts is better however she decreased her distance today. Rest x1. SaO2 better today. To recliner with feet elevated. Discussed/reviewed HF, daily wts, low sodium diet. Pt with good recall. Reading information when I left. Pt has to take bus then cab to go to grocery store. Wants RW for home. Encouraged walking x3 qd.   [id number]  [name] [place] CES, ACSM 07/06/2012 11:47 AM

## 2012-07-06 NOTE — Progress Notes (Signed)
Assessment completed, VSS, pt denies any pain or discomfort, Pt is ST on monitor on the 103.  Pt resting up on recliner chair, no distress noticed.

## 2012-07-07 LAB — GLUCOSE, CAPILLARY

## 2012-07-07 LAB — BASIC METABOLIC PANEL
Calcium: 8.7 mg/dL (ref 8.4–10.5)
Creatinine, Ser: 1.05 mg/dL (ref 0.50–1.10)
GFR calc non Af Amer: 60 mL/min — ABNORMAL LOW (ref >90)
Sodium: 137 mEq/L (ref 135–145)

## 2012-07-07 LAB — MAGNESIUM: Magnesium: 1.8 mg/dL (ref 1.5–2.5)

## 2012-07-07 MED ORDER — TORSEMIDE 20 MG PO TABS: 80.0000 mg | ORAL_TABLET | Freq: Every day | ORAL | Status: DC

## 2012-07-07 MED ORDER — BENZONATATE 100 MG PO CAPS: 100.0000 mg | ORAL_CAPSULE | Freq: Three times a day (TID) | ORAL | Status: DC | PRN

## 2012-07-07 MED ORDER — POTASSIUM CHLORIDE CRYS ER 20 MEQ PO TBCR: 40.0000 meq | EXTENDED_RELEASE_TABLET | Freq: Every day | ORAL | Status: DC

## 2012-07-07 NOTE — Progress Notes (Signed)
IV side due to be change, pt refuse at this time refers she will be possible dc home today.

## 2012-07-07 NOTE — Progress Notes (Signed)
Subjective: Patient reports she is still bothered by cough.  Tessalon helps but reports that it sometimes takes a long time to get since it has to be requested from the pharmacy.  She admits to occasionally coughing up mucus but no yellow or white material.  She reports this is also aggravating her allergies and sinuses. In addition she feels that she has been a little light headed and dizzy.  She admits some chills but has been afebrile. Nursing notes there was an short episode of Sinus Tach up to 147.  Patient reports that her heart beat felt fast when she had a coughing spell.  Cardio was called and no orders given.  Objective: Vital signs in last 24 hours: Filed Vitals:   07/06/12 1443 07/06/12 1849 07/06/12 2121 07/07/12 0552  BP: 91/60 115/59 96/91 105/74  Pulse: 101 108 98 96  Temp: 98.4 F (36.9 C)  97.2 F (36.2 C) 98 F (36.7 C)  TempSrc: Oral  Oral Oral  Resp: _0 Height:      Weight:    249 lb 8 oz (113.172 kg)  SpO2: 94%  100% 95%   Weight change: 8 oz (0.227 kg)  Intake/Output Summary (Last 24 hours) at 07/07/12 0927 Last data filed at 07/07/12 0811  Gross per 24 hour  Intake   1138 ml  Output   2101 ml  Net   -963 ml   Vitals reviewed.  General: resting in bed, sitting up, NAD  Cardiac: RRR,  Pulm: clear to auscultation bilaterally, no wheezes, rales, or rhonchi, cough present Abd: soft, nontender, nondistended, BS present, obese  Ext: warm and well perfused, trace pedal edema, chronic venous ulcers on both legs.  Neuro: alert and oriented X3, grossly neurologically intact, moving all 4 extremities  Skin: skin breakdown to lower extremities   Lab Results: Basic Metabolic Panel:  Recent Labs Lab 07/06/12 1431 07/07/12 0425  NA 139 137  K 3.9 3.4*  CL 101 99  CO2 28 26  GLUCOSE 117* 82  BUN 21 23  CREATININE 1.00 1.05  CALCIUM 8.7 8.7  MG 1.9 1.8   Liver Function Tests:  Recent Labs Lab 06/30/12 1048  AST 38*  ALT 27  ALKPHOS 140*    BILITOT 0.8  PROT 7.3  ALBUMIN 3.2*   CBC:  Recent Labs Lab 06/30/12 1048 07/06/12 0918  WBC 6.1 7.4  NEUTROABS 3.0  --   HGB 12.6 13.0  HCT 38.7 41.5  MCV 78.2 80.6  PLT 235 271   CBG:  Recent Labs Lab 07/05/12 2113 07/06/12 0625 07/06/12 1108 07/06/12 1609 07/06/12 2121 07/07/12 0557  GLUCAP 101* 93 163* 115* 117* 88   Anemia Panel:  Recent Labs Lab 07/01/12 0455  FERRITIN 18  TIBC 432  IRON 27*   Urinalysis:  Recent Labs Lab 06/30/12 1813  COLORURINE YELLOW  LABSPEC 1.009  PHURINE 5.5  GLUCOSEU NEGATIVE  HGBUR NEGATIVE  BILIRUBINUR NEGATIVE  KETONESUR NEGATIVE  PROTEINUR NEGATIVE  UROBILINOGEN 0.2  NITRITE NEGATIVE  LEUKOCYTESUR NEGATIVE   Medications: I have reviewed the patient's current medications. Scheduled Meds: . fluticasone  2 spray Each Nare Daily  . heparin  5,000 Units Subcutaneous Q8H  . insulin aspart  0-9 Units Subcutaneous TID WC  . pantoprazole  20 mg Oral BID  . potassium chloride  40 mEq Oral Daily  . simvastatin  10 mg Oral q1800  . sodium chloride  3 mL Intravenous Q12H  . torsemide  80 mg  Oral Daily  . vitamin B-12  250 mcg Oral Daily   Continuous Infusions:  PRN Meds:.acetaminophen, acetaminophen, albuterol, benzonatate, traMADol Assessment/Plan: 52 y.o signigicant PMH presents with weight gain, dypsnea, acute on chronic respiratory failure, acute renal failure and concern for acute on chronic diastolic HF and CRS.  #Acute on chronic diastolic CHF and core pulmonale  -04/2012 echo with EF 81-82% grade 1 diastolic dsyfunction. Peak pressure 31mHg. HF with preserved ejection fraction likely cause of acute on chronic respiratory failure, dyspnea with exertion and weight gain. She has been eating a lot of dill potatoe chips and 1/2 pickles at at home and taking 3 Ibuprofen every 4-6 hours at home for leg wounds for the last 3 weeks  - Consulted CHF who is following and placed on Lasix gtt. Lasix gtt dose was increased 5  to 10 mg/hr per Card. And D/Ced 7/2 switched to Torsemide. Continue 893mdaily.  - net -20L and 22 lbs.  - Possible right heart cath per Cardiology in the future  - NO Aspirin due allergies, Avoid NSAIDS  - Continue PT and CR.  -Per Cardio OK to D/C today with close outpatient F/U. #Acute on chronic respiratory failure  -Likely multifactorial and acutely secondary to acute on chronic diastolic HF exacerbation and cor pulmonale with severe pulmonary HTN history. She also has a history of restrictive lung disease and OSA on cpap.  -breathing improved with diuresis  -Continue to monitor O2 sat with ambulation  -needs to f/u with pulmonary outpatient  #Acute renal failure most likely multifactorial diuresis with Torsemide (outpatient), ARB and increased use of NSAIDS  -Cardiology concerned for CRS. Creatinine on admission was 1.85 trended down to ~1.0. Baseline <0.8  -Will  monitor Cr and volume status at outpatient appointment on 7/9 # Diabetes Mellitus well-controlled with hemoglobin A1c of 6 05/2012  - Hold metformin  -SSI, monitor cbg  -Fasting glucose has been 100-120 with no insulin needed.  #Lower extremities ulcer  -Patient is followed up by wound care as an outpatient.  - Consulted wound care who recommends outpatient wound care and will not aggressively treat inpatient  -prn Ultram for pain  -H/H RN for wound care  #Sleep apnea  -Cpap qhs  #Cough -Patient has history of chronic cough, continues to cough and reports at times it takes too long to get tessalon from pharmacy. -Will be given prescription to take as needed at home.  Will follow up with PCP in 2 weeks. #DVT px  -Heparin  #F/E/N  -NSL  -HypoK. Continue KCl 40 mEq daily.  Will follow up with outpatient [facility] on 7/9. -Mg++ 1.9  -low Na diet  Dispo: Anticipate discharge home today   The patient does have a current PCP (ELuanne BrasMD) and [facility].  The patient  does not have transportation limitations that hinder transportation to clinic appointments.  .Services Needed at time of discharge: Y = Yes, Blank = No PT:   OT:   RN:   Equipment: Y Rolling walker with 5" wheels   Other:     LOS: 7 days   ErJoni ReiningDO 07/07/2012, 9:27 AM

## 2012-07-07 NOTE — Discharge Summary (Signed)
Name: [name] MRN: [id number] DOB: 07-Jan-1960 52 y.o. PCP: Luanne Bras, MD  Date of Admission: 06/30/2012  1:08 PM Date of Discharge: 07/07/2012 Attending Physician: Dominic Pea, DO  Discharge Diagnosis:   1. chronic diastolic heart failure   2. Cor pulmonale, chronic   3. SLEEP APNEA, OBSTRUCTIVE   4. Venous stasis ulcer   5. Diabetic foot ulcer   6. Type II or unspecified type diabetes mellitus with neurological manifestations, not stated as uncontrolled(250.60)   7. Restrictive lung disease   8. chronic respiratory failure     Discharge Medications:   Medication List         albuterol 108 (90 BASE) MCG/ACT inhaler  Commonly known as:  PROVENTIL HFA;VENTOLIN HFA  Inhale 2 puffs into the lungs every 6 (six) hours as needed for wheezing.     benzonatate 100 MG capsule  Commonly known as:  TESSALON  Take 1 capsule (100 mg total) by mouth 3 (three) times daily as needed for cough.     fluticasone 50 MCG/ACT nasal spray  Commonly known as:  FLONASE  Place 2 sprays into the nose daily.     ibuprofen 200 MG tablet  Commonly known as:  ADVIL,MOTRIN  Take 2 tablets (400 mg total) by mouth every 8 (eight) hours as needed. pain     loratadine 10 MG tablet  Commonly known as:  CLARITIN  Take 1 tablet (10 mg total) by mouth daily as needed for allergies.     losartan 25 MG tablet  Commonly known as:  COZAAR  Take 1 tablet (25 mg total) by mouth daily.     metFORMIN 1000 MG tablet  Commonly known as:  GLUCOPHAGE  Take 1,000 mg by mouth 2 (two) times daily with a meal.     nystatin cream  Commonly known as:  MYCOSTATIN  Apply topically 2 (two) times daily.     pantoprazole 20 MG tablet  Commonly known as:  PROTONIX  Take 1 tablet (20 mg total) by mouth 2 (two) times daily.     potassium chloride SA 20 MEQ tablet  Commonly known as:  K-DUR,KLOR-CON  Take 2 tablets (40 mEq total) by mouth daily.     pravastatin 20 MG tablet  Commonly known as:  PRAVACHOL    TAKE ONE TABLET BY MOUTH EVERY DAY     torsemide 20 MG tablet  Commonly known as:  DEMADEX  Take 4 tablets (80 mg total) by mouth daily.     vitamin B-12 250 MCG tablet  Commonly known as:  CYANOCOBALAMIN  Take 250 mcg by mouth daily.        Disposition and follow-up:   [name] was discharged from [facility] in Stable condition.  At the hospital follow up visit please address:  1.  Recheck DM ulcers on both extremities as well as venous ulcers, ensure patient is receiving home wound care.  Check signs and symptoms of heart failure, and assess compliance with meds (discharge weight 249lbs).  Cardio to order [facility] on 7/9.  Evaluate status of chronic cough.  2.  Labs / imaging needed at time of follow-up: None  3.  Pending labs/ test needing follow-up: None  Follow-up Appointments: Follow-up Information   Follow up with [facility] (home health nurse and aide)    Contact information:   [phone]      Follow up with Glori Bickers, MD On 07/12/2012. (2:00 Garage Code 1000)    Contact information:  [address] [place] [address] [phone]       Follow up with Luanne Bras, MD. Schedule an appointment as soon as possible for a visit on 07/19/2012. (for hospital follow up)    Contact information:   [place] [address] [phone]       Discharge Instructions: Discharge Orders   Future Appointments Provider Department Dept Phone   07/12/2012 2:00 PM [facility] [phone]   07/26/2012 2:45 PM Chesley Mires, MD [facility] [phone]   08/01/2012 2:30 PM Luanne Bras, MD [place] [phone]   Future Orders Complete By Expires     (HEART FAILURE PATIENTS) Call MD:  Anytime you have any of the following symptoms: 1) 3 pound weight gain in 24 hours or 5 pounds in  1 week 2) shortness of breath, with or without a dry hacking cough 3) swelling in the hands, feet or stomach 4) if you have to sleep on extra pillows at night in order to breathe.  As directed     Activity as tolerated - No restrictions  As directed     Call MD for:  persistant dizziness or light-headedness  As directed     Call MD for:  persistant nausea and vomiting  As directed     Call MD for:  temperature >100.4  As directed     Diet - low sodium heart healthy  As directed     Discharge instructions  As directed     Comments:      -Please take Demadex 25m daily (4 tabs) -Take K-Dur (Potassium) 458m daily (2 tabs) -Take Tessalon Pearls as needed for cough -Please start using Nasal Saline for Sinus congestion.    Increase activity slowly  As directed        Consultations: Treatment Team:  Rounding Lbcardiology, MD  Procedures Performed:  Dg Chest 2 View  06/30/2012   *RADIOLOGY REPORT*  Clinical Data: Shortness of breath, CHF.  CHEST - 2 VIEW  Comparison: 02/12/2012.  Findings: Trachea is midline.  Heart is enlarged, stable.  Lungs appear grossly clear.  No pleural fluid.  Flowing anterior osteophytosis in the thoracic spine.  IMPRESSION: No acute findings.   Original Report Authenticated By: MeLorin PicketM.D.   Admission HPI: Ms.Faith A MoHolzmanns a [age].o. 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  Patient reports her shortness of breath has been increasing getting worse. Currently she is experiencing symptoms even while resting. She's getting very winded even  walking from her bedroom to the bathroom, having to stop often to catch her breath.She furthermore is almost sitting upright by sleeping. She denies any chest pain.  She endorses gradual weight gain. She reports that she would gain 1/2 -1 pound a day in the last few weeks. Reviewing the chart she's gained 2 pounds over the past 2 days, representing a 7 pound weight gain over the past 4 days.  Patient also noted dizziness whenever she is getting up to  fast. She does not recall when everything started. Denies any headache, vision changes, numbness, weakness but patient noted that occasionally when she holds things in her left hand she would lose control.  Patient was seen in the clinic 2 days ago. At that time she recently established care with the heart failure clinic, and her diuretics were escalated from lasix to torsemide 40 mg daily. Since that visit, she had gained 5 pounds. Worsening renal function (creatinine 1.5 -->1.7) was noted on 06/28/12 . At baseline she has normal renal function and has had acute worsening of renal function over the past month as her diuretics have been uptitrated.  A workup for right-sided heart failure was recommended by the heart failure clinic including labs (HIV, ANA, ANCA, Scl-70, hepatitis panel). Results were notable for a positive hepatitis B core antibody in the setting of negative hepatitis surface antigen and antibody. She's not had any significant nausea or abdominal pain. Patient denies history of sexual intercourse in the past 20 years, no history of IV drug use, no tattoos. One blood transfusion several years ago. Overall, she is low risk for hepatitis.  Physical Exam on Admission:  Constitutional: Vital signs reviewed. Patient is an obese female in no acute distress and cooperative with exam. Alert and oriented x3.  Mouth: no erythema or exudates, mucous membranes dry  Eyes: PERRL, EOMI, conjunctivae normal, No scleral icterus.  Neck: Supple,  Cardiovascular:  RRR, S1 normal, S2 normal, no MRG, pulses symmetric and intact bilaterally  Pulmonary/Chest: CTAB, no wheezes, rales, or rhonchi  Abdominal: Soft. Non-tender, non-distended, bowel sounds are normal, no masses, organomegaly, or guarding present. Neurological: A&O x3, Strenght is normal and symmetric bilaterally, cranial nerve II-XII are grossly intact, no focal motor deficit, sensory intact to light touch bilaterally.  Skin: Lower extremities: multiple ulcers present on the anterior shin, right foot: 2nd and 3rd toe, left foot: 3rd toe  Upper extremities: excoriations present along with area of hypopigmentation   Lab results on Admission:  Basic Metabolic Panel:   Recent Labs   06/28/12 1455   NA  141   K  4.0   CL  107   CO2  20   GLUCOSE  93   BUN  60*   CREATININE  1.67*   CALCIUM  8.7    Liver Function Tests:  Lab Results   Component  Value  Date    ALT  20  02/11/2012    AST  30  02/11/2012    ALKPHOS  86  02/11/2012    BILITOT  1.0  02/11/2012    CBC: Pending    Hemoglobin A1C:  Lab Results   Component  Value  Date    HGBA1C  6.0  05/31/2012    Fasting Lipid Panel:  Lab Results   Component  Value  Date    CHOL  91  03/15/2012    HDL  27*  03/15/2012    LDLCALC  48  03/15/2012    TRIG  78  03/15/2012    CHOLHDL  3.4  03/15/2012    Thyroid Function Tests:  Anemia Panel:  Lab Results   Component  Value  Date    FERRITIN  78  03/03/2011    Lab Results   Component  Value  Date    VITAMINB12  598  03/03/2011     Hospital Course by problem list: 1. Acute on chronic diastolic CHF and core pulmonale  Patient was admitted from the outpatient clinic after recently  becoming established the heart failure clinic.  Last echo 04/2012 echo with EF 27-07% grade 1 diastolic dsyfunction. Peak pressure 17mHg.  Her diuretics were recently escalated from lasix to torsemide 461mdaily.  Despite this she had recently gain 7 lbs in 4 days and was experiencing dizziness and increasing SOB.   Cardiology was consulted placed on Lasix gtt (maximum dose 1548mr). She subsequently diuresed ~20 liters and her weight decreased ~22 lbs to a discharge weight of 249 lbs.  Her clinical symptoms improved.  Patient was then switched to torsemide 50m32mscharged with a follow up appointment at the heart failure clinic made for 07/12/12.  As outpatient, cardiology will consider right heart cath to evaluate components of right and left pulmonary hypertension. 2. Acute on chronic respiratory failure  Thought to be multifactorial and acutely secondary to acute on chronic diastolic HF exacerbation and cor pulmonale with severe pulmonary HTN history. She also has a history of restrictive lung disease and OSA on cpap. She was place on 2L O2 by nasal cannula with CPAP at night.  Her breathing continued to improve with diuresis and transitioned to only CPAP at night.  Patient may benefit from pulmonary outpatient evaluation. 3. Acute renal failure most likely multifactorial diuresis with Torsemide (outpatient), ARB and increased use of NSAIDS  On admission patient's Cr was seen to be increasing from 1.3 on 6/4 to 1.5 on 6/10 to 1.85 on admission.  Cardiology concerned for Cardiorenal syndrome.  Patient was diuresed and home losartan was held.  Creatine trended down and was stable at ~1.0 at discharge.  4. Diabetes Mellitus  Patient was previous well controlled with Metformin as outpatient with last A1C at 6 in May.  Metformin was held and patient was placed on SSI, with monitoring of CBGs.  Fasting glucose remained at 100-120 for the majority of her hospitalization requiring ~1 unit of insulin daily. 5. Lower extremities ulcer  Patient was noted on admission to have multiple ulcers on her lower extremities, she had recently been treated for possible MRSA infection of those ulcers with doxycycline, and was receiving wound care.  Wound care was continued throughout the hospitalization, and will be organized on  discharge. 6.  Sleep apnea  Patient was continued on CPAP during hospitalization 7. Cough  Patient has history of chronic cough, toward the end of her stay she began to have some coughing spells, she reported that it usually responds to tessalon pearls which she was given.  She was given Tessalon during her stay with improvement, and sent home with a prescription.  She will follow up with her PCP to check on cough symptoms in 2 weeks.    Discharge Vitals:   BP 120/69  Pulse 101  Temp(Src) 98 F (36.7 C) (Oral)  Resp 18  Ht _0  (1.575 m)  Wt 249 lb 8 oz (113.172 kg)  BMI 45.62 kg/m2  SpO2 95%  Discharge Labs:  Results for orders placed during the hospital encounter of 06/30/12 (from the past 24 hour(s))  BASIC METABOLIC PANEL     Status: Abnormal   Collection Time    07/06/12  2:31 PM      Result Value Range   Sodium 139  135 - 145 mEq/L   Potassium 3.9  3.5 - 5.1 mEq/L   Chloride 101  96 - 112 mEq/L   CO2 28  19 - 32 mEq/L   Glucose, Bld 117 (*) 70 - 99 mg/dL   BUN 21  6 - 23 mg/dL  Creatinine, Ser 1.00  0.50 - 1.10 mg/dL   Calcium 8.7  8.4 - 10.5 mg/dL   GFR calc non Af Amer 64 (*) >90 mL/min   GFR calc Af Amer 74 (*) >90 mL/min  MAGNESIUM     Status: None   Collection Time    07/06/12  2:31 PM      Result Value Range   Magnesium 1.9  1.5 - 2.5 mg/dL  GLUCOSE, CAPILLARY     Status: Abnormal   Collection Time    07/06/12  4:09 PM      Result Value Range   Glucose-Capillary 115 (*) 70 - 99 mg/dL   Comment 1 Notify RN    GLUCOSE, CAPILLARY     Status: Abnormal   Collection Time    07/06/12  9:21 PM      Result Value Range   Glucose-Capillary 117 (*) 70 - 99 mg/dL   Comment 1 Notify RN     Comment 2 Documented in Chart    BASIC METABOLIC PANEL     Status: Abnormal   Collection Time    07/07/12  4:25 AM      Result Value Range   Sodium 137  135 - 145 mEq/L   Potassium 3.4 (*) 3.5 - 5.1 mEq/L   Chloride 99  96 - 112 mEq/L   CO2 26  19 - 32 mEq/L   Glucose,  Bld 82  70 - 99 mg/dL   BUN 23  6 - 23 mg/dL   Creatinine, Ser 1.05  0.50 - 1.10 mg/dL   Calcium 8.7  8.4 - 10.5 mg/dL   GFR calc non Af Amer 60 (*) >90 mL/min   GFR calc Af Amer 70 (*) >90 mL/min  MAGNESIUM     Status: None   Collection Time    07/07/12  4:25 AM      Result Value Range   Magnesium 1.8  1.5 - 2.5 mg/dL  GLUCOSE, CAPILLARY     Status: None   Collection Time    07/07/12  5:57 AM      Result Value Range   Glucose-Capillary 88  70 - 99 mg/dL   Comment 1 Documented in Chart     Comment 2 Notify RN    GLUCOSE, CAPILLARY     Status: Abnormal   Collection Time    07/07/12 10:56 AM      Result Value Range   Glucose-Capillary 144 (*) 70 - 99 mg/dL   Comment 1 Notify RN      Signed: Joni Reining, DO 07/07/2012, 1:29 PM   Time Spent on Discharge: 45 minutes Services Ordered on Discharge: [place] on Discharge: Rolling walker

## 2012-07-07 NOTE — Progress Notes (Signed)
DC IV, DC Tele, DC Home. Discharge instructions and home medications discussed with patient. Patient denied any questions or concerns at this time. Patient leaving unit via wheelchair and appears in no acute distress.  

## 2012-07-07 NOTE — Progress Notes (Addendum)
Advanced Heart Failure Rounding Note   Subjective:    Weight and renal function stable on po demadex. Continues with episodes of severe coughing spells. Bringing up clear sputum.    Objective:   Weight Range:  Vital Signs:   Temp:  [97.2 F (36.2 C)-98.4 F (36.9 C)] 98 F (36.7 C) (07/04 0552) Pulse Rate:  [96-108] 101 (07/04 1014) Resp:  [18-20] 18 (07/04 0552) BP: (91-120)/(59-91) 120/69 mmHg (07/04 1014) SpO2:  [94 %-100 %] 95 % (07/04 0552) Weight:  [113.172 kg (249 lb 8 oz)] 113.172 kg (249 lb 8 oz) (07/04 0552) Last BM Date: 07/06/12  Weight change: Filed Weights   07/05/12 0606 07/06/12 0527 07/07/12 0552  Weight: 113.989 kg (251 lb 4.8 oz) 112.946 kg (249 lb) 113.172 kg (249 lb 8 oz)    Intake/Output:   Intake/Output Summary (Last 24 hours) at 07/07/12 1118 Last data filed at 07/07/12 1111  Gross per 24 hour  Intake   1381 ml  Output   3002 ml  Net  -1621 ml     Physical Exam: General: Morbidly obese. sitting on side of bed with severe coughing spell HEENT: normal Neck: supple. JVP difficult to assess d/t body habitus. Carotids 2+ bilat; no bruits. No lymphadenopathy or thryomegaly appreciated. Cor: PMI nonpalpable. RRR Lungs: clear Abdomen: soft, nontender, nondistended. No hepatosplenomegaly. No bruits or masses. Good bowel sounds. Extremities: no cyanosis, clubbing, rash, tr-1+ edema Neuro: alert & orientedx3, cranial nerves grossly intact. moves all 4 extremities w/o difficulty. Affect pleasant  Telemetry: SR 90-100s  Labs: Basic Metabolic Panel:  Recent Labs Lab 07/05/12 1556 07/06/12 0105 07/06/12 0918 07/06/12 1431 07/07/12 0425  NA 138 136 138 139 137  K 3.6 3.5 3.6 3.9 3.4*  CL 99 97 98 101 99  CO2 _0 GLUCOSE 121* 91 160* 117* 82  BUN _1 CREATININE 1.09 0.93 1.04 1.00 1.05  CALCIUM 8.6 8.8 8.9 8.7 8.7  MG 2.2 2.4 1.9 1.9 1.8    Liver Function Tests: No results found for this basename: AST, ALT,  ALKPHOS, BILITOT, PROT, ALBUMIN,  in the last 168 hours No results found for this basename: LIPASE, AMYLASE,  in the last 168 hours No results found for this basename: AMMONIA,  in the last 168 hours  CBC:  Recent Labs Lab 07/06/12 0918  WBC 7.4  HGB 13.0  HCT 41.5  MCV 80.6  PLT 271    Cardiac Enzymes: No results found for this basename: CKTOTAL, CKMB, CKMBINDEX, TROPONINI,  in the last 168 hours  BNP: BNP (last 3 results)  Recent Labs  02/11/12 1519 02/14/12 0700  PROBNP 3892.0* 3408.0*     Other results:    Imaging: No results found.   Medications:     Scheduled Medications: . fluticasone  2 spray Each Nare Daily  . heparin  5,000 Units Subcutaneous Q8H  . insulin aspart  0-9 Units Subcutaneous TID WC  . pantoprazole  20 mg Oral BID  . potassium chloride  40 mEq Oral Daily  . simvastatin  10 mg Oral q1800  . sodium chloride  3 mL Intravenous Q12H  . torsemide  80 mg Oral Daily  . vitamin B-12  250 mcg Oral Daily    Infusions:    PRN Medications: acetaminophen, acetaminophen, albuterol, benzonatate, traMADol   Assessment:   1) Acute and chronic respiratory failure  2) Cor pulmonale  3) Pulmonary HTN  4) A/C diastolic HF  5) Morbid  obesity  6) A/C renal failure  7) Non-healing wounds  8) Mental retardation  9) Microcytic anemia    Plan/Discussion:     Volume status much improved. Not sure what is causing [name] severe coughing spells. From HF standpoint, [name] today on demadex 80 po daily. Will need close outpatient f/u with visit and BMET. Will likely need RHC as outpatient to sort out components of R and L pHTN.   Faith Wimer,MD 11:18 AM

## 2012-07-07 NOTE — Progress Notes (Signed)
Physical Therapy Treatment Patient Details Name: ODIS WICKEY MRN: [id number] DOB: 15-Jun-1960 Today's Date: 07/07/2012 Time: 8316-7425 PT Time Calculation (min): 24 min  PT Assessment / Plan / Recommendation  PT Comments   Pt able to increase ambulation and perform stairs again with decrease assistance.  Pt with productive cough and end of session.     Follow Up Recommendations  Supervision - Intermittent     Equipment Recommendations  Rolling walker with 5" wheels    Frequency Min 3X/week   Plan Current plan remains appropriate    Precautions / Restrictions Precautions Precautions: None Restrictions Weight Bearing Restrictions: No   Pertinent Vitals/Pain No c/o pain; pt's SaO2 maintained > 91% on RA however noticeable SOB with increase RR with ambulation.     Mobility  Bed Mobility Bed Mobility: Not assessed Transfers Transfers: Sit to Stand;Stand to Sit Sit to Stand: 6: Modified independent (Device/Increase time);With upper extremity assist;With armrests;From chair/3-in-1 Stand to Sit: 6: Modified independent (Device/Increase time);With upper extremity assist;With armrests;To chair/3-in-1 Details for Transfer Assistance: pt with good technique requiring no cueing Ambulation/Gait Ambulation/Gait Assistance: 6: Modified independent (Device/Increase time) Ambulation Distance (Feet): 200 Feet Assistive device: Rolling walker Stairs: Yes Stairs Assistance: 4: Min guard Stairs Assistance Details (indicate cue type and reason): Minguard for safety with cues for proper step sequence Stair Management Technique: One rail Left;Step to pattern;Forwards;With cane Number of Stairs: 3    Exercises     PT Diagnosis:    PT Problem List:   PT Treatment Interventions:     PT Goals (current goals can now be found in the care plan section) Acute Rehab PT Goals Patient Stated Goal: home PT Goal Formulation: With patient Time For Goal Achievement: 07/10/12 Potential to Achieve  Goals: Good  Visit Information  Last PT Received On: 07/07/12 Assistance Needed: +1 History of Present Illness: Pt admitted with SOB and dizziness    Subjective Data  Subjective: "I'm suppose to be leaving today if I can get a hold of my son." Patient Stated Goal: home   Cognition  Cognition Arousal/Alertness: Awake/alert Behavior During Therapy: WFL for tasks assessed/performed Overall Cognitive Status: Within Functional Limits for tasks assessed    Balance     End of Session PT - End of Session Equipment Utilized During Treatment: Gait belt Activity Tolerance: Patient tolerated treatment well Patient left: in chair;with call bell/phone within reach Nurse Communication: Mobility status   GP     Lukasz Rogus 07/07/2012, 2:14 PM Antoine Poche, [place] DPT [phone]

## 2012-07-10 ENCOUNTER — Telehealth (HOSPITAL_COMMUNITY): Payer: Self-pay | Admitting: *Deleted

## 2012-07-10 NOTE — Discharge Summary (Signed)
  Date: 07/10/2012  Patient name: [name]  Medical record number: [id number]  Date of birth: 1960-05-26   This patient has been discussed with the house staff. Please see their note for complete details. I concur with their findings and plan.  Dominic Pea, DO 07/10/2012, 1:26 PM

## 2012-07-10 NOTE — Telephone Encounter (Signed)
Pt called to c/o of a 3 lb wt gain overnight, she states wt on 7/5 was 241.8 7/6 243 and today 246.6, she states she doesn't notice any increase in edema, she states she did eat a lot of fruit for dinner last night, including watermelon, cantaloupe and strawberries, discussed w/pt these fruits are high in water.  Discussed w/Amy Ninfa Meeker, NP will have pt take extra dose of Torsemide and Potassium today, pt is aware and verbalizes understanding

## 2012-07-11 ENCOUNTER — Ambulatory Visit (HOSPITAL_COMMUNITY): Payer: Medicaid Other

## 2012-07-12 ENCOUNTER — Emergency Department (HOSPITAL_COMMUNITY): Payer: Medicaid Other

## 2012-07-12 ENCOUNTER — Encounter (HOSPITAL_COMMUNITY): Payer: Self-pay

## 2012-07-12 ENCOUNTER — Other Ambulatory Visit: Payer: Self-pay

## 2012-07-12 ENCOUNTER — Inpatient Hospital Stay (HOSPITAL_COMMUNITY)
Admission: EM | Admit: 2012-07-12 | Discharge: 2012-07-15 | DRG: 291 | Disposition: A | Discharge status: Other Acute Inpatient Hospital | Payer: Medicaid Other | Attending: Pulmonary Disease | Admitting: Pulmonary Disease

## 2012-07-12 ENCOUNTER — Encounter (HOSPITAL_COMMUNITY): Payer: Self-pay | Admitting: Emergency Medicine

## 2012-07-12 ENCOUNTER — Ambulatory Visit (HOSPITAL_BASED_OUTPATIENT_CLINIC_OR_DEPARTMENT_OTHER)
Admit: 2012-07-12 | Discharge: 2012-07-12 | Disposition: A | Discharge status: Home / Self Care | Payer: Medicaid Other | Attending: Internal Medicine | Admitting: Internal Medicine

## 2012-07-12 VITALS — BP 65/39 | HR 82 | Wt 251.8 lb

## 2012-07-12 DIAGNOSIS — I959 Hypotension, unspecified: Secondary | ICD-10-CM | POA: Insufficient documentation

## 2012-07-12 DIAGNOSIS — I951 Orthostatic hypotension: Secondary | ICD-10-CM

## 2012-07-12 DIAGNOSIS — I509 Heart failure, unspecified: Secondary | ICD-10-CM | POA: Diagnosis present

## 2012-07-12 DIAGNOSIS — I50810 Right heart failure, unspecified: Secondary | ICD-10-CM

## 2012-07-12 DIAGNOSIS — I872 Venous insufficiency (chronic) (peripheral): Secondary | ICD-10-CM | POA: Diagnosis present

## 2012-07-12 DIAGNOSIS — L97509 Non-pressure chronic ulcer of other part of unspecified foot with unspecified severity: Secondary | ICD-10-CM | POA: Diagnosis present

## 2012-07-12 DIAGNOSIS — I129 Hypertensive chronic kidney disease with stage 1 through stage 4 chronic kidney disease, or unspecified chronic kidney disease: Secondary | ICD-10-CM | POA: Diagnosis present

## 2012-07-12 DIAGNOSIS — A0472 Enterocolitis due to Clostridium difficile, not specified as recurrent: Secondary | ICD-10-CM | POA: Diagnosis present

## 2012-07-12 DIAGNOSIS — I2781 Cor pulmonale (chronic): Secondary | ICD-10-CM

## 2012-07-12 DIAGNOSIS — R197 Diarrhea, unspecified: Secondary | ICD-10-CM | POA: Diagnosis present

## 2012-07-12 DIAGNOSIS — N181 Chronic kidney disease, stage 1: Secondary | ICD-10-CM | POA: Diagnosis present

## 2012-07-12 DIAGNOSIS — R579 Shock, unspecified: Secondary | ICD-10-CM

## 2012-07-12 DIAGNOSIS — F70 Mild intellectual disabilities: Secondary | ICD-10-CM | POA: Diagnosis present

## 2012-07-12 DIAGNOSIS — F329 Major depressive disorder, single episode, unspecified: Secondary | ICD-10-CM | POA: Diagnosis present

## 2012-07-12 DIAGNOSIS — I5032 Chronic diastolic (congestive) heart failure: Secondary | ICD-10-CM

## 2012-07-12 DIAGNOSIS — K219 Gastro-esophageal reflux disease without esophagitis: Secondary | ICD-10-CM | POA: Diagnosis present

## 2012-07-12 DIAGNOSIS — L97809 Non-pressure chronic ulcer of other part of unspecified lower leg with unspecified severity: Secondary | ICD-10-CM | POA: Diagnosis present

## 2012-07-12 DIAGNOSIS — E1169 Type 2 diabetes mellitus with other specified complication: Secondary | ICD-10-CM | POA: Diagnosis present

## 2012-07-12 DIAGNOSIS — R570 Cardiogenic shock: Secondary | ICD-10-CM

## 2012-07-12 DIAGNOSIS — I279 Pulmonary heart disease, unspecified: Secondary | ICD-10-CM

## 2012-07-12 DIAGNOSIS — I2609 Other pulmonary embolism with acute cor pulmonale: Secondary | ICD-10-CM | POA: Diagnosis present

## 2012-07-12 DIAGNOSIS — IMO0002 Reserved for concepts with insufficient information to code with codable children: Secondary | ICD-10-CM

## 2012-07-12 DIAGNOSIS — R0602 Shortness of breath: Secondary | ICD-10-CM

## 2012-07-12 DIAGNOSIS — J9620 Acute and chronic respiratory failure, unspecified whether with hypoxia or hypercapnia: Secondary | ICD-10-CM | POA: Diagnosis present

## 2012-07-12 DIAGNOSIS — I2789 Other specified pulmonary heart diseases: Secondary | ICD-10-CM | POA: Diagnosis present

## 2012-07-12 DIAGNOSIS — E119 Type 2 diabetes mellitus without complications: Secondary | ICD-10-CM | POA: Diagnosis present

## 2012-07-12 DIAGNOSIS — N189 Chronic kidney disease, unspecified: Secondary | ICD-10-CM

## 2012-07-12 DIAGNOSIS — E785 Hyperlipidemia, unspecified: Secondary | ICD-10-CM | POA: Diagnosis present

## 2012-07-12 DIAGNOSIS — J984 Other disorders of lung: Secondary | ICD-10-CM | POA: Diagnosis present

## 2012-07-12 DIAGNOSIS — Z791 Long term (current) use of non-steroidal anti-inflammatories (NSAID): Secondary | ICD-10-CM

## 2012-07-12 DIAGNOSIS — Z6841 Body Mass Index (BMI) 40.0 and over, adult: Secondary | ICD-10-CM

## 2012-07-12 DIAGNOSIS — D500 Iron deficiency anemia secondary to blood loss (chronic): Secondary | ICD-10-CM | POA: Diagnosis present

## 2012-07-12 DIAGNOSIS — F3289 Other specified depressive episodes: Secondary | ICD-10-CM | POA: Diagnosis present

## 2012-07-12 DIAGNOSIS — I5033 Acute on chronic diastolic (congestive) heart failure: Secondary | ICD-10-CM

## 2012-07-12 DIAGNOSIS — N179 Acute kidney failure, unspecified: Secondary | ICD-10-CM | POA: Diagnosis present

## 2012-07-12 DIAGNOSIS — G4733 Obstructive sleep apnea (adult) (pediatric): Secondary | ICD-10-CM | POA: Diagnosis present

## 2012-07-12 HISTORY — DX: Heart failure, unspecified: I50.9

## 2012-07-12 HISTORY — DX: Disorder of kidney and ureter, unspecified: N28.9

## 2012-07-12 LAB — BASIC METABOLIC PANEL
CO2: 18 mEq/L — ABNORMAL LOW (ref 19–32)
Calcium: 8.7 mg/dL (ref 8.4–10.5)
Creatinine, Ser: 4.55 mg/dL — ABNORMAL HIGH (ref 0.50–1.10)
GFR calc non Af Amer: 10 mL/min — ABNORMAL LOW (ref >90)
Glucose, Bld: 94 mg/dL (ref 70–99)

## 2012-07-12 LAB — CBC WITH DIFFERENTIAL/PLATELET
Basophils Absolute: 0 10*3/uL (ref 0.0–0.1)
Eosinophils Absolute: 0 10*3/uL (ref 0.0–0.7)
Eosinophils Relative: 0 % (ref 0–5)
HCT: 37.8 % (ref 36.0–46.0)
Lymphocytes Relative: 31 % (ref 12–46)
Lymphs Abs: 2.7 10*3/uL (ref 0.7–4.0)
MCH: 25.6 pg — ABNORMAL LOW (ref 26.0–34.0)
MCV: 77.3 fL — ABNORMAL LOW (ref 78.0–100.0)
Monocytes Absolute: 1.2 10*3/uL — ABNORMAL HIGH (ref 0.1–1.0)
RDW: 19.9 % — ABNORMAL HIGH (ref 11.5–15.5)
WBC: 8.5 10*3/uL (ref 4.0–10.5)

## 2012-07-12 LAB — POCT I-STAT TROPONIN I: Troponin i, poc: 0.1 ng/mL (ref 0.00–0.08)

## 2012-07-12 LAB — BLOOD GAS, ARTERIAL
Acid-base deficit: 11.3 mmol/L — ABNORMAL HIGH (ref 0.0–2.0)
O2 Saturation: 100 %
TCO2: 13.9 mmol/L (ref 0–100)
pCO2 arterial: 24.1 mmHg — ABNORMAL LOW (ref 35.0–45.0)
pO2, Arterial: 150 mmHg — ABNORMAL HIGH (ref 80.0–100.0)

## 2012-07-12 LAB — GLUCOSE, CAPILLARY: Glucose-Capillary: 83 mg/dL (ref 70–99)

## 2012-07-12 MED ORDER — SODIUM CHLORIDE 0.9 % IV BOLUS (SEPSIS)
500.0000 mL | Freq: Once | INTRAVENOUS | Status: AC
  Administered 2012-07-12: 500 mL via INTRAVENOUS

## 2012-07-12 MED ORDER — INSULIN ASPART 100 UNIT/ML ~~LOC~~ SOLN
0.0000 [IU] | Freq: Three times a day (TID) | SUBCUTANEOUS | Status: DC
  Administered 2012-07-13: 2 [IU] via SUBCUTANEOUS
  Administered 2012-07-14: 3 [IU] via SUBCUTANEOUS

## 2012-07-12 MED ORDER — BENZONATATE 100 MG PO CAPS
100.0000 mg | ORAL_CAPSULE | Freq: Three times a day (TID) | ORAL | Status: DC | PRN
  Administered 2012-07-13 – 2012-07-14 (×2): 100 mg via ORAL
  Filled 2012-07-12 (×2): qty 1

## 2012-07-12 MED ORDER — ACETAMINOPHEN 650 MG RE SUPP: 650.0000 mg | Freq: Four times a day (QID) | RECTAL | Status: DC | PRN

## 2012-07-12 MED ORDER — PANTOPRAZOLE SODIUM 20 MG PO TBEC
20.0000 mg | DELAYED_RELEASE_TABLET | Freq: Two times a day (BID) | ORAL | Status: DC
Start: 2012-07-12 — End: 2012-07-15
  Administered 2012-07-12 – 2012-07-14 (×5): 20 mg via ORAL
  Filled 2012-07-12 (×6): qty 1

## 2012-07-12 MED ORDER — HEPARIN SODIUM (PORCINE) 5000 UNIT/ML IJ SOLN
5000.0000 [IU] | Freq: Three times a day (TID) | INTRAMUSCULAR | Status: DC
  Administered 2012-07-12 – 2012-07-14 (×7): 5000 [IU] via SUBCUTANEOUS
  Filled 2012-07-12 (×8): qty 1

## 2012-07-12 MED ORDER — SIMVASTATIN 20 MG PO TABS
20.0000 mg | ORAL_TABLET | Freq: Every day | ORAL | Status: DC
  Administered 2012-07-13 – 2012-07-14 (×2): 20 mg via ORAL
  Filled 2012-07-12 (×2): qty 1

## 2012-07-12 MED ORDER — ALBUTEROL SULFATE HFA 108 (90 BASE) MCG/ACT IN AERS: 2.0000 | INHALATION_SPRAY | Freq: Four times a day (QID) | RESPIRATORY_TRACT | Status: DC | PRN

## 2012-07-12 MED ORDER — ONDANSETRON HCL 4 MG/2ML IJ SOLN: 4.0000 mg | Freq: Four times a day (QID) | INTRAMUSCULAR | Status: DC | PRN

## 2012-07-12 MED ORDER — ONDANSETRON HCL 4 MG PO TABS: 4.0000 mg | ORAL_TABLET | Freq: Four times a day (QID) | ORAL | Status: DC | PRN

## 2012-07-12 MED ORDER — LORATADINE 10 MG PO TABS
10.0000 mg | ORAL_TABLET | Freq: Every day | ORAL | Status: DC | PRN
  Administered 2012-07-13: 10 mg via ORAL
  Filled 2012-07-12 (×2): qty 1

## 2012-07-12 MED ORDER — SODIUM CHLORIDE 0.9 % IJ SOLN
3.0000 mL | Freq: Two times a day (BID) | INTRAMUSCULAR | Status: DC
  Administered 2012-07-12 – 2012-07-14 (×5): 3 mL via INTRAVENOUS

## 2012-07-12 MED ORDER — ACETAMINOPHEN 325 MG PO TABS
650.0000 mg | ORAL_TABLET | Freq: Four times a day (QID) | ORAL | Status: DC | PRN
  Administered 2012-07-13: 650 mg via ORAL
  Filled 2012-07-12: qty 2

## 2012-07-12 MED ORDER — SODIUM CHLORIDE 0.9 % IV SOLN
INTRAVENOUS | Status: AC
  Administered 2012-07-12 – 2012-07-13 (×2): via INTRAVENOUS

## 2012-07-12 NOTE — H&P (Signed)
Date: 07/12/2012               Patient Name:  [name] MRN: [id number]  DOB: [birth year] Age / Sex: 52 y.o., female   PCP: Faith Bras, MD         Medical Service: Internal Medicine Teaching Service         Attending Physician: Dr. Carmin Muskrat, MD    First Contact: Dr. Joni Reining Pager: [phone]  Second Contact: Dr. Fransisca Kaufmann Pager: [phone]       After Hours (After 5p/  First Contact Pager: [phone]  weekends / holidays): Second Contact Pager: [phone]   Chief Complaint: diarrhea  History of Present Illness: [name] is  52 yo female with a PMH of diastolic HF (EF 48-59% 27/6394), cor pulmonale with severe pulmonary artery HTN, chronic venous stasis, DM type 2, OSA, mental retardation and morbid obesity.  She has a history of hospitalizations for CHF and was recently admitted 06/27-07/04/14 acute on chronic diastolic HF.  During that admission her symptoms improved with Lasix gtt and she was discharged in stable condition on torsemide 43m daily with [facility] Dr. LVelora Barker(appt 07/26/12) and [facility]    She is followed by cardiology, Dr. BTempie Hoistand was seen today [facility] for discharge follow-up and found to be hypotensive (BP in clinic 65/39) and lightheaded.  She reports she began having loose stools during her last admission.  She says she felt okay when she left but began having increasing diarrhea (loose to watery BMs).  She also has N/V, abdominal pain with the diarrhea and fatigue and dizziness.  She denies fever/cough/sick contacts.       Ms. MCabralreceived a total of 1.5L NS in route to and in the ED and BP improved to 80/48.  Her Cr was found to be 4.55 (baseline ~1).   Meds: No current facility-administered medications for this encounter.   Current Outpatient Prescriptions  Medication Sig Dispense Refill  . albuterol  (PROVENTIL HFA;VENTOLIN HFA) 108 (90 BASE) MCG/ACT inhaler Inhale 2 puffs into the lungs every 6 (six) hours as needed for wheezing.  1 Inhaler  2  . benzonatate (TESSALON) 100 MG capsule Take 1 capsule (100 mg total) by mouth 3 (three) times daily as needed for cough.  90 capsule  0  . fluticasone (FLONASE) 50 MCG/ACT nasal spray Place 2 sprays into the nose daily.  16 g  3  . ibuprofen (ADVIL,MOTRIN) 200 MG tablet Take 2 tablets (400 mg total) by mouth every 8 (eight) hours as needed. pain  30 tablet    . loratadine (CLARITIN) 10 MG tablet Take 1 tablet (10 mg total) by mouth daily as needed for allergies.  30 tablet  2  . losartan (COZAAR) 25 MG tablet Take 1 tablet (25 mg total) by mouth daily.  30 tablet  11  . metFORMIN (GLUCOPHAGE) 1000 MG tablet Take 1,000 mg by mouth 2 (two) times daily with a meal.      . nystatin cream (MYCOSTATIN) Apply topically 2 (two) times daily.  30 g  0  . pantoprazole (PROTONIX) 20 MG tablet Take 1 tablet (20 mg total) by mouth 2 (two) times daily.  180 tablet  4  . potassium chloride SA (K-DUR,KLOR-CON) 20 MEQ tablet Take 2 tablets (40 mEq total) by mouth daily.  60 tablet  0  . pravastatin (PRAVACHOL)  20 MG tablet TAKE ONE TABLET BY MOUTH EVERY DAY  30 tablet  5  . torsemide (DEMADEX) 20 MG tablet Take 4 tablets (80 mg total) by mouth daily.  120 tablet  3  . vitamin B-12 (CYANOCOBALAMIN) 250 MCG tablet Take 250 mcg by mouth daily.        Allergies: Allergies as of 07/12/2012 - Review Complete 07/12/2012  Allergen Reaction Noted  . Aspirin  12/09/2005  . Codeine    . Lisinopril  05/02/2012  . Sulfonamide derivatives  10/19/2005   Past Medical History  Diagnosis Date  . OSA (obstructive sleep apnea)     CPAP  . Venous stasis ulcer     chornic, ?followed up at wound care center, multiple courses of antibiotics in past for cellulitis, on lasix  . GERD (gastroesophageal reflux disease)   . Anemia, iron deficiency     secondary to menhorrhagia, on oral  iron, also b12 def, getting monthly b12 shots  . Chronic cough     secondary to alleriges and post nasal drip  . Depression   . Diabetes mellitus     well controlled on metformin  . Hyperlipidemia   . H/O mental retardation   . Herpes   . Cor pulmonale     PA Peak pressure 100mHg  . Diastolic heart failure   . Restrictive lung disease     PFTs 06/2012 (FVC 54% predicted and FEV1 68% predicted w minimal bronchodilator response).  . CHF (congestive heart failure)   . Renal disorder    Past Surgical History  Procedure Laterality Date  . Cholecystectomy     Family History  Problem Relation Age of Onset  . Mental illness Sister   . Mental retardation Brother    History   Social History  . Marital Status: Single    Spouse Name: N/A    Number of Children: N/A  . Years of Education: N/A   Occupational History  . Not on file.   Social History Main Topics  . Smoking status: Never Smoker   . Smokeless tobacco: Never Used  . Alcohol Use: No  . Drug Use: No  . Sexually Active: Not on file   Other Topics Concern  . Not on file   Social History Narrative  . No narrative on file    Review of Systems: Pertinent items are noted in HPI.  Physical Exam: Blood pressure 82/48, pulse 88, temperature 97.7 F (36.5 C), temperature source Rectal, resp. rate 17, SpO2 100.00%. General: resting in bed in NAD but looks ill HEENT: mucous membranes moderately dry  Cardiac: RRR, no rubs, murmurs or gallops Pulm: decreased BS bilaterally (R >L), no w/r/r, no accessory muscle use noted Abd: soft, nontender, nondistended, BS present Ext: edematous, venous stasis dermatitis Neuro: alert and oriented X3, mentating appropriately for her baseline  Lab results: Basic Metabolic Panel:  Recent Labs  07/12/12 1645  NA 126*  K 5.1  CL 90*  CO2 18*  GLUCOSE 94  BUN 84*  CREATININE 4.55*  CALCIUM 8.7   Liver Function Tests: No results found for this basename: AST, ALT, ALKPHOS,  BILITOT, PROT, ALBUMIN,  in the last 72 hours No results found for this basename: LIPASE, AMYLASE,  in the last 72 hours No results found for this basename: AMMONIA,  in the last 72 hours CBC:  Recent Labs  07/12/12 1645  WBC 8.5  NEUTROABS 4.6  HGB 12.5  HCT 37.8  MCV 77.3*  PLT 321   Cardiac  Enzymes: No results found for this basename: CKTOTAL, CKMB, CKMBINDEX, TROPONINI,  in the last 72 hours BNP:  Recent Labs  07/12/12 1645  PROBNP 22696.0*   D-Dimer: No results found for this basename: DDIMER,  in the last 72 hours CBG: No results found for this basename: GLUCAP,  in the last 72 hours Hemoglobin A1C: No results found for this basename: HGBA1C,  in the last 72 hours Fasting Lipid Panel: No results found for this basename: CHOL, HDL, LDLCALC, TRIG, CHOLHDL, LDLDIRECT,  in the last 72 hours Thyroid Function Tests: No results found for this basename: TSH, T4TOTAL, FREET4, T3FREE, THYROIDAB,  in the last 72 hours Anemia Panel: No results found for this basename: VITAMINB12, FOLATE, FERRITIN, TIBC, IRON, RETICCTPCT,  in the last 72 hours Coagulation: No results found for this basename: LABPROT, INR,  in the last 72 hours Urine Drug Screen: Drugs of Abuse  No results found for this basename: labopia, cocainscrnur, labbenz, amphetmu, thcu, labbarb    Alcohol Level: No results found for this basename: ETH,  in the last 72 hours Urinalysis: No results found for this basename: COLORURINE, APPERANCEUR, LABSPEC, PHURINE, GLUCOSEU, HGBUR, BILIRUBINUR, KETONESUR, PROTEINUR, UROBILINOGEN, NITRITE, LEUKOCYTESUR,  in the last 72 hours  Imaging results:  Dg Chest 1 View  07/12/2012   *RADIOLOGY REPORT*  Clinical Data: Shortness of breath.  Hypertension.  Diabetic. Nonsmoker.  CHEST - 1 VIEW  Comparison: 06/30/2012.  Findings: Marked cardiomegaly.  Mild prominence aortic knob.  Central pulmonary vascular prominence without pulmonary edema.  No segmental infiltrate or gross  pneumothorax.  Evaluation retrocardiac region limited by the heart size.  IMPRESSION: Marked cardiomegaly and central pulmonary vascular prominence similar to the prior exam.  Please see above.   Original Report Authenticated By: Genia Del, M.D.    Other results: EKG: NSR, new first degree AV block when compared to 06/30/12 EKG  Assessment & Plan by Problem:  [name] is  52 yo female with a PMH of diastolic HF (EF 17-91% 50/5697), cor pulmonale with severe pulmonary artery HTN, chronic venous stasis, DM type 2, OSA, mental retardation and morbid obesity.  1. Acute renal failure - Cr 4.55 (baseline ~1.0).  Likely multifactorial etiology. [name] was recently started on Torsemide at last admission.  Her AKI could be cardiorenal in the setting of her diastolic HF.  Chronic NSAID use may also be contributing.  - admit to stepdown - administer IV fluids judiciously - stop NSAIDs and avoid nephrotoxic medications - monitor BMPs  2. Hypotension - BP 60s/40s today but improved with IVF.  Likely due to recent diarrhea and use of torsemide.  Infectious cause needs to be ruled out.  Unlikely due to bleed as Hgb 12.5 and patient denies bleeding but will monitor CBC. - admit to stepdown - IV fluids  - EKG - orthostatic vitals - CBC, lactic acid, UA, Cardiac enzymes - consider ICU transfer if decompensates   3. DM type 2 - metformin 562m BID at home - SSI moderate  4. DVT ppx - heparin, SCDs    Dispo: Disposition is deferred at this time, awaiting improvement of current medical problems. Anticipated discharge in approximately 3-4 day(s).   The patient does have a current PCP (Faith Bras MD) and [facility].  The patient does not know have transportation limitations that hinder transportation to clinic appointments.  Signed: ADuwaine Maxin DO 07/12/2012, 8:12 PM

## 2012-07-12 NOTE — Progress Notes (Signed)
24 gauge IV started in pt's right hand by Suella Broad, RN and 500 cc NS bolus administered, after completion BP by doppler was 64 another 500 cc NS bolus was started.

## 2012-07-12 NOTE — Consult Note (Signed)
PULMONARY  / CRITICAL CARE MEDICINE  Name: [name] MRN: [id number] DOB: 12-13-1960    ADMISSION DATE:  07/12/2012 CONSULTATION DATE:  07/12/2012  REFERRING MD :  IM teaching service PRIMARY SERVICE: PCCM  CHIEF COMPLAINT:  hypotension  BRIEF PATIENT DESCRIPTION: 52 y/o female with severe PH, obesity, diastolic dysfunction was admitted on 7/9 with hypotension in the setting of diarrhea; PCCM consulted when hypotension did not improve and hypoxemia developed after volume resuscitation.  SIGNIFICANT EVENTS / STUDIES:  7/9 admission  LINES / TUBES: 7/10 L IJ CVL >>  CULTURES: 7/9 blood >> 7/9 c.diff >>  ANTIBIOTICS:   HISTORY OF PRESENT ILLNESS:  52 y/o female with severe PH, obesity, diastolic dysfunction was admitted on 7/9 with hypotension in the setting of diarrhea; PCCM consulted when hypotension did not improve and hypoxemia developed after volume resuscitation.  She states that she has had diarrhea for several days.  She denies dysuria (says she hasn't urinated much), cough or fever.  She has developed some dyspnea and chest tightness since coming to the hospital.  She denies abdominal pain.  No recent antibiotics that we are aware of.   PAST MEDICAL HISTORY :  Past Medical History  Diagnosis Date  . OSA (obstructive sleep apnea)     CPAP  . Venous stasis ulcer     chornic, ?followed up at wound care center, multiple courses of antibiotics in past for cellulitis, on lasix  . GERD (gastroesophageal reflux disease)   . Anemia, iron deficiency     secondary to menhorrhagia, on oral iron, also b12 def, getting monthly b12 shots  . Chronic cough     secondary to alleriges and post nasal drip  . Depression   . Diabetes mellitus     well controlled on metformin  . Hyperlipidemia   . H/O mental retardation   . Herpes   . Cor pulmonale     PA Peak pressure 85mHg  . Diastolic heart failure   . Restrictive lung disease     PFTs 06/2012 (FVC 54% predicted and FEV1 68%  predicted w minimal bronchodilator response).  . CHF (congestive heart failure)   . Renal disorder    Past Surgical History  Procedure Laterality Date  . Cholecystectomy     Prior to Admission medications   Medication Sig Start Date End Date Taking? Authorizing Provider  albuterol (PROVENTIL HFA;VENTOLIN HFA) 108 (90 BASE) MCG/ACT inhaler Inhale 2 puffs into the lungs every 6 (six) hours as needed for wheezing. 10/26/11 10/25/12 Yes CAwilda Metro NP  benzonatate (TESSALON) 100 MG capsule Take 1 capsule (100 mg total) by mouth 3 (three) times daily as needed for cough. 07/07/12  Yes EJoni Reining DO  fluticasone (FLONASE) 50 MCG/ACT nasal spray Place 2 sprays into the nose daily. 11/16/11  Yes CTonia Brooms MD  ibuprofen (ADVIL,MOTRIN) 200 MG tablet Take 2 tablets (400 mg total) by mouth every 8 (eight) hours as needed. pain 02/15/12  Yes KOtho Bellows MD  loratadine (CLARITIN) 10 MG tablet Take 1 tablet (10 mg total) by mouth daily as needed for allergies. 03/15/12 03/15/13 Yes CTonia Brooms MD  losartan (COZAAR) 25 MG tablet Take 1 tablet (25 mg total) by mouth daily. 05/02/12 05/02/13 Yes CTonia Brooms MD  metFORMIN (GLUCOPHAGE) 1000 MG tablet Take 1,000 mg by mouth 2 (two) times daily with a meal. 08/05/11 08/04/12 Yes LKarren Cobble MD  nystatin cream (MYCOSTATIN) Apply topically 2 (two) times daily. 06/28/12  Yes CTonia Brooms MD  pantoprazole (PROTONIX) 20 MG tablet Take 1 tablet (20 mg total) by mouth 2 (two) times daily. 08/24/11  Yes Tonia Brooms, MD  potassium chloride SA (K-DUR,KLOR-CON) 20 MEQ tablet Take 2 tablets (40 mEq total) by mouth daily. 07/07/12  Yes Joni Reining, DO  pravastatin (PRAVACHOL) 20 MG tablet TAKE ONE TABLET BY MOUTH EVERY DAY 06/28/12  Yes Tonia Brooms, MD  torsemide (DEMADEX) 20 MG tablet Take 4 tablets (80 mg total) by mouth daily. 07/07/12  Yes Joni Reining, DO  vitamin B-12 (CYANOCOBALAMIN) 250 MCG tablet Take 250 mcg by mouth daily.   Yes Historical  Provider, MD   Allergies  Allergen Reactions  . Aspirin     REACTION: airway swelling  . Codeine     REACTION: tingling in lips and hard breathing - had reaction at dentist - states "I can't take certain kinds of codeine" - happened maybe 10 yr ago  . Lisinopril     Cough  . Sulfonamide Derivatives     REACTION: airway swelling    FAMILY HISTORY:  Family History  Problem Relation Age of Onset  . Mental illness Sister   . Mental retardation Brother    SOCIAL HISTORY:  reports that she has never smoked. She has never used smokeless tobacco. She reports that she does not drink alcohol or use illicit drugs.  REVIEW OF SYSTEMS:   Gen: Denies fever, chills, weight change, + fatigue, night sweats HEENT: Denies blurred vision, double vision, hearing loss, tinnitus, sinus congestion, rhinorrhea, sore throat, neck stiffness, dysphagia PULM: notes some shortness of breath, denies cough, sputum production, hemoptysis, wheezing CV: Notes some chest tightness, chronic edema, orthopnea, paroxysmal nocturnal dyspnea, palpitations GI: Denies abdominal pain, nausea, vomiting, + diarrhea, hematochezia, melena, constipation, change in bowel habits GU: Denies dysuria, hematuria, polyuria, oliguria, urethral discharge Endocrine: Denies hot or cold intolerance, polyuria, polyphagia or appetite change Derm: Denies rash, dry skin, scaling or peeling skin change Heme: Denies easy bruising, bleeding, bleeding gums Neuro: Denies headache, numbness, weakness, slurred speech, loss of memory or consciousness   SUBJECTIVE:   VITAL SIGNS: Temp:  [97.5 F (36.4 C)-97.7 F (36.5 C)] 97.5 F (36.4 C) (07/09 2028) Pulse Rate:  [81-88] 84 (07/09 2215) Resp:  [14-26] 14 (07/09 2215) BP: (61-91)/(39-78) 76/48 mmHg (07/09 2215) SpO2:  [88 %-100 %] 92 % (07/09 2215) Weight:  [113.9 kg (251 lb 1.7 oz)-114.216 kg (251 lb 12.8 oz)] 113.9 kg (251 lb 1.7 oz) (07/09 2028) HEMODYNAMICS:   VENTILATOR SETTINGS:    INTAKE / OUTPUT: Intake/Output     07/09 0701 - 07/10 0700   I.V. (mL/kg) 3 (0)   Total Intake(mL/kg) 3 (0)   Net +3         PHYSICAL EXAMINATION:  Gen: chronically ill appearing, breathing comfortably on NRB HEENT: NCAT, PERRL, EOMi, OP clear, neck supple without masses PULM: few crackles in bases bilaterally CV: RRR, distant heart sounds, cannot assess JVD due to neck fat AB: BS+, soft, nontender, no hsm Ext: warm, massive chronic edema, no clubbing, no cyanosis Derm: multiple small skin ulcerations (bug bites?) throughout extremities Neuro: awake and alert, answering questions, moving all four extremities   LABS:  Recent Labs Lab 07/06/12 0918 07/06/12 1431 07/07/12 0425 07/12/12 1645 07/12/12 2205  HGB 13.0  --   --  12.5  --   WBC 7.4  --   --  8.5  --   PLT 271  --   --  321  --   NA 138 139 137  126*  --   K 3.6 3.9 3.4* 5.1  --   CL 98 101 99 90*  --   CO2 _0 18*  --   GLUCOSE 160* 117* 82 94  --   BUN _1 84*  --   CREATININE 1.04 1.00 1.05 4.55*  --   CALCIUM 8.9 8.7 8.7 8.7  --   MG 1.9 1.9 1.8  --   --   PROBNP  --   --   --  22696.0*  --   PHART  --   --   --   --  7.356  PCO2ART  --   --   --   --  24.1*  PO2ART  --   --   --   --  150.0*    Recent Labs Lab 07/06/12 1609 07/06/12 2121 07/07/12 0557 07/07/12 1056 07/12/12 2227  GLUCAP 115* 117* 88 144* 83    CXR: massive cardiomegaly, lungs clear  ASSESSMENT / PLAN:  PULMONARY A: Acute on chronic Hypoxemic respiratory failure > presumably due to volume overload in setting of heart failure; baseline 2L   OSA  No clear evidence of underlying pulmonary disease 2010 PFT: ratio 98%, FEV1 1.80L (71%), FVC 1.84 (56% pred), decrease with bronchodilator; TLC 3.75 L (77% pred), ERV 0.36L (32% pred); DLCO 22.91 (91% pred)  P:   -hold further fluids -O2 as needed -BIPAP if needed -CPAP QHS minimum  CARDIOVASCULAR A:  Pulmonary hypertension: presumably Class II/III due to OSA  and Diastolic dysfunction; no RHC data yet  CHF > initially thought to be near compensated on admission, but with worsening shock and hypoxemia now looking decompensated  Shock> presumably cardiogenic related to volume overload (not uncommon scenario with large dilated RV); consider hypovolemia and sepsis, but does not appear septic  P:  -place CVL -a-line -lactic acid -procalcitonin -cortisol -SvO2 -measure CVP -hold further IVF -dopamine as needed for MAP > 65 -needs RHC to accurately measure PA pressure and PCWP -repeat 12 lead  RENAL A:  AKI > presumably volume loss from diarrhea P:   -foley -hold further IVF -BMET now again in AM  GASTROINTESTINAL A:  Diarrhea > viral vs less likely c.diff P:   -f/u c.diff pcf -check norovirus -diet as tolerated  HEMATOLOGIC A:  No acute issues P:    INFECTIOUS A:  Viral gastroenteritis? P:   -check   ENDOCRINE A:  DM2 P:   -SSI -check cortisol  NEUROLOGIC A:  No acute issues P:   -  TODAY'S SUMMARY:   I have personally obtained a history, examined the patient, evaluated laboratory and imaging results, formulated the assessment and plan and placed orders. CRITICAL CARE: The patient is critically ill with multiple organ systems failure and requires high complexity decision making for assessment and support, frequent evaluation and titration of therapies, application of advanced monitoring technologies and extensive interpretation of multiple databases. Critical Care Time devoted to patient care services described in this note is 60 minutes.   Lemmie Evens Pulmonary and [place] Pager: [phone]  07/12/2012, 10:47 PM

## 2012-07-12 NOTE — Progress Notes (Signed)
[name], female   DOB: 05-Sep-1960, 52 y.o.   MRN: [id number]  Weight Range   Baseline proBNP     HPI: [name] is a 52 y.o. 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 Referred by Dr. Ginette Otto for further management of [place] and HF.   Has struggled with swelling for a long time. But ells me she was diagnosed with HF in 4/14. In looking over the notes, she was admitted to the hospital in 2/14 with volume overload. Echo at the time showed severe cor pulmonale.  - Left ventricle: The cavity size was normal. Wall thickness was increased in a pattern of mild LVH. Systolic function was normal. The estimated ejection fraction was in the range of 55% to 60%. Wall motion was normal; there were noregional wall motion abnormalities. Septal wall flattening consistent with elevated right ventricular pressure/volume. Doppler parameters are consistent with abnormal left ventricular relaxation (grade 1 diastolic dysfunction). - Left atrium: The atrium was mildly dilated. - Right ventricle: The cavity size was severely dilated. Systolic function was severely reduced. - Right atrium: The atrium was moderately dilated. - Pulmonary arteries: Systolic pressure was severely increased. PA peak pressure: 28m Hg (S). - Pericardium, extracardiac: A moderate, free-flowing pericardial effusion was identified posterior to the heart and along the left ventricular free wall. There was no evidence of hemodynamic compromise.   [facility] with increased dyspnea placed on lasix drip and transitioned to torsemide 80 mg daily. Diuresed 22 pounds.  MRSA lower extremity ulcers.  Discharge weight 249 pounds.    She returns for post hospital follow up. On Monday she took an additional torsemide for weight  gain. Over the last 2  days she complains of dizziness, vomiting, fatigue, diarrhea.  Poor appetite. Weight at home 248-251 pounds. Denies SOB/PND/Orhtopnea. O2 sat at home 95%. Ambulates at home with a cane. Taking all meds but nauseated. [facility]   BP with doppler 64   ROS: All systems negative except as listed in HPI, PMH and Problem List.  Past Medical History  Diagnosis Date  . OSA (obstructive sleep apnea)     CPAP  . Venous stasis ulcer     chornic, ?followed up at wound care center, multiple courses of antibiotics in past for cellulitis, on lasix  . GERD (gastroesophageal reflux disease)   . Anemia, iron deficiency     secondary to menhorrhagia, on oral iron, also b12 def, getting monthly b12 shots  . Chronic cough     secondary to alleriges and post nasal drip  . Depression   . Diabetes mellitus     well controlled on metformin  . Hyperlipidemia   . H/O mental retardation   . Herpes   . Cor pulmonale     PA Peak pressure 813mg  . Diastolic heart failure   . Restrictive lung disease     PFTs 06/2012 (FVC 54% predicted and FEV1 68% predicted w minimal bronchodilator response).    Current Outpatient Prescriptions  Medication Sig Dispense Refill  . albuterol (PROVENTIL HFA;VENTOLIN HFA) 108 (90 BASE) MCG/ACT inhaler Inhale 2 puffs into the lungs every 6 (six) hours as needed for wheezing.  1 Inhaler  2  . benzonatate (TESSALON) 100 MG capsule Take 1 capsule (100 mg total) by mouth 3 (three) times daily as needed  for cough.  90 capsule  0  . fluticasone (FLONASE) 50 MCG/ACT nasal spray Place 2 sprays into the nose daily.  16 g  3  . ibuprofen (ADVIL,MOTRIN) 200 MG tablet Take 2 tablets (400 mg total) by mouth every 8 (eight) hours as needed. pain  30 tablet    . loratadine (CLARITIN) 10 MG tablet Take 1 tablet (10 mg total) by mouth daily as needed for allergies.  30 tablet  2  . losartan (COZAAR) 25 MG tablet Take 1 tablet (25 mg total) by mouth daily.  30 tablet   11  . metFORMIN (GLUCOPHAGE) 1000 MG tablet Take 1,000 mg by mouth 2 (two) times daily with a meal.      . nystatin cream (MYCOSTATIN) Apply topically 2 (two) times daily.  30 g  0  . pantoprazole (PROTONIX) 20 MG tablet Take 1 tablet (20 mg total) by mouth 2 (two) times daily.  180 tablet  4  . potassium chloride SA (K-DUR,KLOR-CON) 20 MEQ tablet Take 2 tablets (40 mEq total) by mouth daily.  60 tablet  0  . pravastatin (PRAVACHOL) 20 MG tablet TAKE ONE TABLET BY MOUTH EVERY DAY  30 tablet  5  . torsemide (DEMADEX) 20 MG tablet Take 4 tablets (80 mg total) by mouth daily.  120 tablet  3  . vitamin B-12 (CYANOCOBALAMIN) 250 MCG tablet Take 250 mcg by mouth daily.       No current facility-administered medications for this encounter.     PHYSICAL EXAM: Filed Vitals:   07/12/12 1415  BP: 65/39  Pulse: 82  Weight: 251 lb 12.8 oz (114.216 kg)  SpO2: 95%    Physical Exam:  General: Morbidly obese. Sitting in wheel chair. HEENT: normal  Neck: supple. JVP difficult to assess d/t body habitus. But appears flat  Carotids 2+ bilat; no bruits. No lymphadenopathy or thryomegaly appreciated.  Cor: PMI nonpalpable. RRR  Lungs: clear  Abdomen: soft, nontender, nondistended. No hepatosplenomegaly. No bruits or masses. Good bowel sounds.  Extremities: no cyanosis, clubbing, rash, RLE dressing in place. LLE tr edema  Neuro: alert & orientedx3, cranial nerves grossly intact. moves all 4 extremities w/o difficulty. Affect pleasant     ASSESSMENT & PLAN:

## 2012-07-12 NOTE — Assessment & Plan Note (Addendum)
She returned for post hospital follow with N/V/D. SBP 65 with doppler. Volume status low. Given 1 liter NS with no improvement and started  Vomiting. At that time she was transported to [facility] ED for further evaluation. Will need to hold diuretics. Dr Haroldine Laws evaluated and agree with plan.   Attending: Agree with above. See my notes.

## 2012-07-12 NOTE — Progress Notes (Signed)
Pt's 02 levels dropping to 70's on  so changed to 50% venti mask, still sats 85%. Dr. Algis Liming notified and pt placed on NRB mask with sats 88-91%. Called rapid response nurse to look at pt also.

## 2012-07-12 NOTE — Patient Instructions (Signed)
Hold torsemide and restart on Saturday  Do the following things EVERYDAY: 1) Weigh yourself in the morning before breakfast. Write it down and keep it in a log. 2) Take your medicines as prescribed 3) Eat low salt foods-Limit salt (sodium) to 2000 mg per day.  4) Stay as active as you can everyday 5) Limit all fluids for the day to less than 2 liters

## 2012-07-12 NOTE — ED Notes (Signed)
No distress noted. Pt states that she feels sob.  Resp symmetrical and unlabored at the present.  Skin warm and dry.  Hypotension noted.  Bolus still infusing MD aware.

## 2012-07-12 NOTE — Progress Notes (Signed)
Upon arrival to floor from ED, pt's BP 70/48, Dr. Algis Liming notified and new orders carried out.

## 2012-07-12 NOTE — ED Notes (Signed)
Pt was being re evalutated by her cardiologist when she was noted to be hypotensive.  No distress noted resp symmetrical and unlabored.  A/o x 3.

## 2012-07-12 NOTE — Progress Notes (Signed)
Was informed by nurse that Ms. Millar was de-satting to 85% despite use of 50% venturi mask.  Ms. Sakata was seen and examined by Dr. Algis Liming and I.   S:  She reports she is feeling better since arriving on the floor.  She continues to experience diffuse abdominal pain.  She has not had diarrhea or urinated since arriving on the floor. O: BP 76/48, HR 84, RR 14, SpO2 90%.  General:  Ms. Vaux was laying in bed with NRB mask on in NAD. Lungs:  Decreased BS on R, wheezing on L Cardiac:  RRR, + S3 Abdomen:  + hyperactive BS, mild diffuse abdominal tenderness A/P:  52 yo female with PMH of diastolic HF (EF 80-22%), cor pulmonale with pulmonary artery HTN, OSA, DM type 2 and morbid obesity admitted for acute renal failure now de-satting to the low 80s. - stat ABG (7.356/24/150/13.9/100%) - stop IV fluids, consider diuresis - bladder scan  - consult PCCM for possible ICU transfer for monitoring of oxygenation and BP.  - Dr. Lake Bells (PCCM) came to evaluate patient and consensus was reached that patient needs transfer to ICU for further management and care was transferred to the ICU team.

## 2012-07-12 NOTE — Assessment & Plan Note (Signed)
Patient seen and examined with Darrick Grinder, NP. We discussed all aspects of the encounter. I agree with the assessment and plan as stated above.   Attending: She is markedly hypotensive with SBP 60s. She did not respond well to 1L NS given in clinic. I suspect she is dry but may also have infectious process - possibly c.diff. We have transferred to ER and I spoke with ER physician personally. She will likely be admitted to IM service for further management. Hold diuretics for now. We will follow.

## 2012-07-12 NOTE — ED Provider Notes (Signed)
History    CSN: [id number] Arrival date & time 07/12/12  1615  None    Chief Complaint  Patient presents with  . Hypotension   (Consider location/radiation/quality/duration/timing/severity/associated sxs/prior Treatment) HPI [name] 52 y.o. with a history diastolic heart failure, cor pulmonale, diabetes and mental retardation who presents from cardiology clinic for hypotension. Patient reportedly had blood pressures of 60s over 40s in the clinic. She was brought immediately to the emergency department. She was given a 500 cc bolus of normal saline prior to arrival and was actively receiving her second bolus of 500 cc of normal saline. She complains of nausea vomiting, diarrhea, lightheadedness for the past week. She's recently hospitalized and discharged reportedly 5 days ago for CHF. She's had ongoing nausea, vomiting, and diarrhea since that time. She feels is low the symptoms are worsening. Emesis is described as nonbilious and nonbloody. Diarrhea is noted to be 3-5 times a day liquid. It's associated with urgency. She denies blood in her stool. She does have associated abdominal pain with her bowel movements. Past Medical History  Diagnosis Date  . OSA (obstructive sleep apnea)     CPAP  . Venous stasis ulcer     chornic, ?followed up at wound care center, multiple courses of antibiotics in past for cellulitis, on lasix  . GERD (gastroesophageal reflux disease)   . Anemia, iron deficiency     secondary to menhorrhagia, on oral iron, also b12 def, getting monthly b12 shots  . Chronic cough     secondary to alleriges and post nasal drip  . Depression   . Diabetes mellitus     well controlled on metformin  . Hyperlipidemia   . H/O mental retardation   . Herpes   . Cor pulmonale     PA Peak pressure 55mHg  . Diastolic heart failure   . Restrictive lung disease     PFTs 06/2012 (FVC 54% predicted and FEV1 68% predicted w minimal bronchodilator response).   Past Surgical  History  Procedure Laterality Date  . Cholecystectomy     Family History  Problem Relation Age of Onset  . Mental illness Sister   . Mental retardation Brother    History  Substance Use Topics  . Smoking status: Never Smoker   . Smokeless tobacco: Never Used  . Alcohol Use: No   OB History   Grav Para Term Preterm Abortions TAB SAB Ect Mult Living   _0 Review of Systems  Constitutional: Negative for fever, chills, diaphoresis, activity change and appetite change.  HENT: Negative for sore throat, rhinorrhea, sneezing, drooling and trouble swallowing.   Eyes: Negative for discharge and redness.  Respiratory: Positive for shortness of breath (Is at baseline). Negative for cough, chest tightness, wheezing and stridor.   Cardiovascular: Positive for leg swelling (Is at baseline). Negative for chest pain.  Gastrointestinal: Positive for nausea, vomiting, abdominal pain and diarrhea. Negative for constipation and blood in stool.  Genitourinary: Negative for difficulty urinating.  Musculoskeletal: Negative for myalgias and arthralgias.  Skin: Positive for wound (Bilateral venous stasis ulcers, improving). Negative for pallor.  Neurological: Negative for dizziness, syncope, speech difficulty, weakness, light-headedness and headaches.  Hematological: Negative for adenopathy. Does not bruise/bleed easily.  Psychiatric/Behavioral: Negative for confusion and agitation.    Allergies  Aspirin; Codeine; Lisinopril; and Sulfonamide derivatives  Home Medications   Current Outpatient Rx  Name  Route  Sig  Dispense  Refill  .  albuterol (PROVENTIL HFA;VENTOLIN HFA) 108 (90 BASE) MCG/ACT inhaler   Inhalation   Inhale 2 puffs into the lungs every 6 (six) hours as needed for wheezing.   1 Inhaler   2   . benzonatate (TESSALON) 100 MG capsule   Oral   Take 1 capsule (100 mg total) by mouth 3 (three) times daily as needed for cough.   90 capsule   0   . fluticasone  (FLONASE) 50 MCG/ACT nasal spray   Nasal   Place 2 sprays into the nose daily.   16 g   3   . ibuprofen (ADVIL,MOTRIN) 200 MG tablet   Oral   Take 2 tablets (400 mg total) by mouth every 8 (eight) hours as needed. pain   30 tablet      . loratadine (CLARITIN) 10 MG tablet   Oral   Take 1 tablet (10 mg total) by mouth daily as needed for allergies.   30 tablet   2   . losartan (COZAAR) 25 MG tablet   Oral   Take 1 tablet (25 mg total) by mouth daily.   30 tablet   11   . metFORMIN (GLUCOPHAGE) 1000 MG tablet   Oral   Take 1,000 mg by mouth 2 (two) times daily with a meal.         . nystatin cream (MYCOSTATIN)   Topical   Apply topically 2 (two) times daily.   30 g   0   . pantoprazole (PROTONIX) 20 MG tablet   Oral   Take 1 tablet (20 mg total) by mouth 2 (two) times daily.   180 tablet   4   . potassium chloride SA (K-DUR,KLOR-CON) 20 MEQ tablet   Oral   Take 2 tablets (40 mEq total) by mouth daily.   60 tablet   0   . pravastatin (PRAVACHOL) 20 MG tablet      TAKE ONE TABLET BY MOUTH EVERY DAY   30 tablet   5   . torsemide (DEMADEX) 20 MG tablet   Oral   Take 4 tablets (80 mg total) by mouth daily.   120 tablet   3   . vitamin B-12 (CYANOCOBALAMIN) 250 MCG tablet   Oral   Take 250 mcg by mouth daily.          BP 80/48  Pulse 85  Temp(Src) 97.7 F (36.5 C) (Rectal)  Resp 26  SpO2 96% Physical Exam  Constitutional: She is oriented to person, place, and time. She appears well-developed and well-nourished. No distress.  Patient is obese  HENT:  Head: Normocephalic and atraumatic.  Right Ear: External ear normal.  Left Ear: External ear normal.  Eyes: Conjunctivae and EOM are normal. Right eye exhibits no discharge. Left eye exhibits no discharge.  Neck: Normal range of motion. Neck supple. No JVD present.  Cardiovascular: Normal rate, regular rhythm and normal heart sounds.  Exam reveals no gallop and no friction rub.   No murmur  heard. Pulmonary/Chest: Effort normal and breath sounds normal. No stridor. No respiratory distress. She has no wheezes. She has no rales. She exhibits no tenderness.  Abdominal: Soft. Bowel sounds are normal. She exhibits no distension. There is generalized tenderness. There is no rigidity, no rebound, no guarding, no tenderness at McBurney's point and negative Murphy's sign.  Musculoskeletal: Normal range of motion. She exhibits no edema.  2+ bilateral lower extremity edema Small venous stasis ulcer bilaterally in the mid tibial region that appears to be healing  appropriately  Neurological: She is alert and oriented to person, place, and time.  Skin: Skin is warm. No rash noted. She is not diaphoretic.  Psychiatric: She has a normal mood and affect. Her behavior is normal.    ED Course  Procedures (including critical care time) Labs Reviewed  CBC WITH DIFFERENTIAL - Abnormal; Notable for the following:    MCV 77.3 (*)    MCH 25.6 (*)    RDW 19.9 (*)    Monocytes Relative 14 (*)    Monocytes Absolute 1.2 (*)    All other components within normal limits  BASIC METABOLIC PANEL - Abnormal; Notable for the following:    Sodium 126 (*)    Chloride 90 (*)    CO2 18 (*)    BUN 84 (*)    Creatinine, Ser 4.55 (*)    GFR calc non Af Amer 10 (*)    GFR calc Af Amer 12 (*)    All other components within normal limits  PRO B NATRIURETIC PEPTIDE - Abnormal; Notable for the following:    Pro B Natriuretic peptide (BNP) 22696.0 (*)    All other components within normal limits  CLOSTRIDIUM DIFFICILE BY PCR  URINALYSIS, ROUTINE W REFLEX MICROSCOPIC   Dg Chest 1 View  07/12/2012   *RADIOLOGY REPORT*  Clinical Data: Shortness of breath.  Hypertension.  Diabetic. Nonsmoker.  CHEST - 1 VIEW  Comparison: 06/30/2012.  Findings: Marked cardiomegaly.  Mild prominence aortic knob.  Central pulmonary vascular prominence without pulmonary edema.  No segmental infiltrate or gross pneumothorax.  Evaluation  retrocardiac region limited by the heart size.  IMPRESSION: Marked cardiomegaly and central pulmonary vascular prominence similar to the prior exam.  Please see above.   Original Report Authenticated By: Genia Del, M.D.   No diagnosis found. Results for orders placed during the hospital encounter of 07/12/12  CBC WITH DIFFERENTIAL      Result Value Range   WBC 8.5  4.0 - 10.5 K/uL   RBC 4.89  3.87 - 5.11 MIL/uL   Hemoglobin 12.5  12.0 - 15.0 g/dL   HCT 37.8  36.0 - 46.0 %   MCV 77.3 (*) 78.0 - 100.0 fL   MCH 25.6 (*) 26.0 - 34.0 pg   MCHC 33.1  30.0 - 36.0 g/dL   RDW 19.9 (*) 11.5 - 15.5 %   Platelets 321  150 - 400 K/uL   Neutrophils Relative % 54  43 - 77 %   Neutro Abs 4.6  1.7 - 7.7 K/uL   Lymphocytes Relative 31  12 - 46 %   Lymphs Abs 2.7  0.7 - 4.0 K/uL   Monocytes Relative 14 (*) 3 - 12 %   Monocytes Absolute 1.2 (*) 0.1 - 1.0 K/uL   Eosinophils Relative 0  0 - 5 %   Eosinophils Absolute 0.0  0.0 - 0.7 K/uL   Basophils Relative 0  0 - 1 %   Basophils Absolute 0.0  0.0 - 0.1 K/uL  BASIC METABOLIC PANEL      Result Value Range   Sodium 126 (*) 135 - 145 mEq/L   Potassium 5.1  3.5 - 5.1 mEq/L   Chloride 90 (*) 96 - 112 mEq/L   CO2 18 (*) 19 - 32 mEq/L   Glucose, Bld 94  70 - 99 mg/dL   BUN 84 (*) 6 - 23 mg/dL   Creatinine, Ser 4.55 (*) 0.50 - 1.10 mg/dL   Calcium 8.7  8.4 - 10.5 mg/dL  GFR calc non Af Amer 10 (*) >90 mL/min   GFR calc Af Amer 12 (*) >90 mL/min  PRO B NATRIURETIC PEPTIDE      Result Value Range   Pro B Natriuretic peptide (BNP) 22696.0 (*) 0 - 125 pg/mL     Date: 07/12/2012  Rate: 84   Rhythm: sinus  QRS Axis: left  Intervals: PR prolonged and QT prolonged  ST/T Wave abnormalities: nonspecific ST changes and nonspecific T wave changes  Conduction Disutrbances:first-degree A-V block   Narrative Interpretation: Sinus rhythm with changes noted above  Old EKG Reviewed: unchanged   MDM  Kayliegh A Brent 52 y.o. with a past medical history  specify above who presents from cardiology clinic for hypotension. Systolic blood pressure in the clinic the 60s over 74s. She was symptomatic at that time with lightheadedness. The emergency department initial blood pressure was 70/45. Heart rate was within normal limits. Oxygen saturation is within normal limits on room air. In the setting of persistent vomiting and diarrhea for the past several days this could likely represent dehydration. In the setting of heart failure patient is being gently hydrated and has been given 1 L of fluids and currently being given a 500 cc bolus. White blood cell counts within normal limits. Sodium is decreased at 126. Creatinine 4.55 which is 4 times her baseline. BNP is 22,000. Concern of volume depletion in the setting of worsening heart failure. Admission indicated. Patient was admitted to the internal medicine teaching service. Labs, EKG, and imaging reviewed. I discussed this patient's care with my attending, Dr. Vanita Panda.  Kelby Aline, MD 07/13/12 [phone]

## 2012-07-13 ENCOUNTER — Inpatient Hospital Stay (HOSPITAL_COMMUNITY): Payer: Medicaid Other

## 2012-07-13 DIAGNOSIS — R579 Shock, unspecified: Secondary | ICD-10-CM | POA: Diagnosis present

## 2012-07-13 DIAGNOSIS — N179 Acute kidney failure, unspecified: Secondary | ICD-10-CM

## 2012-07-13 DIAGNOSIS — N189 Chronic kidney disease, unspecified: Secondary | ICD-10-CM

## 2012-07-13 DIAGNOSIS — R0602 Shortness of breath: Secondary | ICD-10-CM | POA: Diagnosis present

## 2012-07-13 DIAGNOSIS — R197 Diarrhea, unspecified: Secondary | ICD-10-CM

## 2012-07-13 DIAGNOSIS — R570 Cardiogenic shock: Secondary | ICD-10-CM

## 2012-07-13 LAB — CBC
MCH: 25.6 pg — ABNORMAL LOW (ref 26.0–34.0)
MCHC: 33.1 g/dL (ref 30.0–36.0)
MCV: 77.6 fL — ABNORMAL LOW (ref 78.0–100.0)
Platelets: 297 10*3/uL (ref 150–400)
RBC: 4.68 MIL/uL (ref 3.87–5.11)

## 2012-07-13 LAB — CARBOXYHEMOGLOBIN: Total hemoglobin: 12.5 g/dL (ref 12.0–16.0)

## 2012-07-13 LAB — UREA NITROGEN, URINE: Urea Nitrogen, Ur: 258 mg/dL

## 2012-07-13 LAB — GLUCOSE, CAPILLARY: Glucose-Capillary: 105 mg/dL — ABNORMAL HIGH (ref 70–99)

## 2012-07-13 LAB — POCT I-STAT 3, ART BLOOD GAS (G3+)
O2 Saturation: 95 %
pCO2 arterial: 34.1 mmHg — ABNORMAL LOW (ref 35.0–45.0)
pH, Arterial: 7.309 — ABNORMAL LOW (ref 7.350–7.450)

## 2012-07-13 LAB — BASIC METABOLIC PANEL
BUN: 85 mg/dL — ABNORMAL HIGH (ref 6–23)
CO2: 17 mEq/L — ABNORMAL LOW (ref 19–32)
Calcium: 8 mg/dL — ABNORMAL LOW (ref 8.4–10.5)
GFR calc non Af Amer: 10 mL/min — ABNORMAL LOW (ref >90)
Glucose, Bld: 80 mg/dL (ref 70–99)
Sodium: 128 mEq/L — ABNORMAL LOW (ref 135–145)

## 2012-07-13 LAB — TROPONIN I
Troponin I: <0.3 ng/mL (ref <0.30)
Troponin I: <0.3 ng/mL (ref <0.30)

## 2012-07-13 LAB — MRSA PCR SCREENING: MRSA by PCR: POSITIVE — AB

## 2012-07-13 LAB — URINALYSIS, ROUTINE W REFLEX MICROSCOPIC
Specific Gravity, Urine: 1.017 (ref 1.005–1.030)
pH: 5.5 (ref 5.0–8.0)

## 2012-07-13 LAB — CORTISOL: Cortisol, Plasma: 18.8 ug/dL

## 2012-07-13 LAB — PROCALCITONIN
Procalcitonin: 0.84 ng/mL
Procalcitonin: 0.77 ng/mL

## 2012-07-13 LAB — URINE MICROSCOPIC-ADD ON

## 2012-07-13 MED ORDER — SODIUM CHLORIDE 0.9 % IV SOLN
INTRAVENOUS | Status: DC
  Administered 2012-07-13 – 2012-07-14 (×2): via INTRAVENOUS

## 2012-07-13 MED ORDER — MILRINONE IN DEXTROSE 20 MG/100ML IV SOLN
0.1250 ug/kg/min | INTRAVENOUS | Status: DC
  Administered 2012-07-13: 0.125 ug/kg/min via INTRAVENOUS
  Filled 2012-07-13: qty 100

## 2012-07-13 MED ORDER — MUPIROCIN 2 % EX OINT
1.0000 "application " | TOPICAL_OINTMENT | Freq: Two times a day (BID) | CUTANEOUS | Status: DC
  Administered 2012-07-13 – 2012-07-14 (×5): 1 via NASAL
  Filled 2012-07-13: qty 22

## 2012-07-13 MED ORDER — BIOTENE DRY MOUTH MT LIQD
15.0000 mL | OROMUCOSAL | Status: DC
  Administered 2012-07-13 – 2012-07-15 (×12): 15 mL via OROMUCOSAL

## 2012-07-13 MED ORDER — DOPAMINE-DEXTROSE 3.2-5 MG/ML-% IV SOLN
2.0000 ug/kg/min | INTRAVENOUS | Status: DC
  Administered 2012-07-13: 2 ug/kg/min via INTRAVENOUS
  Administered 2012-07-14: 7.3 ug/kg/min via INTRAVENOUS
  Filled 2012-07-13 (×3): qty 250

## 2012-07-13 MED ORDER — CHLORHEXIDINE GLUCONATE CLOTH 2 % EX PADS
6.0000 | MEDICATED_PAD | Freq: Every day | CUTANEOUS | Status: DC
  Administered 2012-07-13 – 2012-07-14 (×2): 6 via TOPICAL

## 2012-07-13 MED ORDER — SODIUM CHLORIDE 0.9 % IV SOLN
INTRAVENOUS | Status: DC
  Administered 2012-07-13: 07:00:00 via INTRAVENOUS

## 2012-07-13 NOTE — Progress Notes (Signed)
A-line attempted x2 without success. Patient tolerated well. Will continue to monitor.

## 2012-07-13 NOTE — Progress Notes (Addendum)
PULMONARY  / CRITICAL CARE MEDICINE  Name: [name] MRN: [id number] DOB: 12-25-1960    ADMISSION DATE:  07/12/2012 CONSULTATION DATE:  07/12/2012  REFERRING MD :  IM teaching service PRIMARY SERVICE: PCCM  CHIEF COMPLAINT:  hypotension  BRIEF PATIENT DESCRIPTION: 52 y/o female with severe PH, obesity, diastolic dysfunction was admitted on 7/9 with hypotension in the setting of diarrhea; PCCM consulted when hypotension did not improve and hypoxemia developed after volume resuscitation.  SIGNIFICANT EVENTS / STUDIES:  7/9 admission  LINES / TUBES: 7/10 L IJ CVL >>  CULTURES: 7/9 blood >> 7/9 c.diff >> Results for orders placed during the hospital encounter of 07/12/12  CLOSTRIDIUM DIFFICILE BY PCR     Status: None   Collection Time    07/12/12  6:33 PM      Result Value Range Status   C difficile by pcr NEGATIVE  NEGATIVE Final  MRSA PCR SCREENING     Status: Abnormal   Collection Time    07/12/12  8:32 PM      Result Value Range Status   MRSA by PCR POSITIVE (*) NEGATIVE Final   Comment:            The GeneXpert MRSA Assay (FDA     approved for NASAL specimens     only), is one component of a     comprehensive MRSA colonization     surveillance program. It is not     intended to diagnose MRSA     infection nor to guide or     monitor treatment for     MRSA infections.     RESULT CALLED TO, READ BACK BY AND VERIFIED WITH:     CALLED TO RN Chana Bode [id number] _[id number]  THANEY      ANTIBIOTICS: Anti-infectives   None       BRIEF  52 y/o female with severe PH, obesity, diastolic dysfunction was admitted on 7/9 with hypotension in the setting of diarrhea; PCCM consulted when hypotension did not improve and hypoxemia developed after volume resuscitation.  She states that she has had diarrhea for several days.  She denies dysuria (says she hasn't urinated much), cough or fever.  She has developed some dyspnea and chest tightness since coming to the hospital.   She denies abdominal pain.  No recent antibiotics that we are aware of. PCCM consulted 07/12/12   has a past medical history of OSA (obstructive sleep apnea); Venous stasis ulcer; GERD (gastroesophageal reflux disease); Anemia, iron deficiency; Chronic cough; Depression; Diabetes mellitus; Hyperlipidemia; H/O mental retardation; Herpes; Cor pulmonale; Diastolic heart failure; Restrictive lung disease; CHF (congestive heart failure); and Renal disorder.  has past surgical history that includes Cholecystectomy.   SUBJECTIVE:   07/13/2012: She is not intubated but she is on 4 L nasal cannula. She is alert and oriented. She is on dopamine but this will be switched to milrinone by cardiology. CVP is 26. C. difficile negative  VITAL SIGNS: Temp:  [97.5 F (36.4 C)-98.1 F (36.7 C)] 97.9 F (36.6 C) (07/10 0600) Pulse Rate:  [63-104] 95 (07/10 0800) Resp:  [14-26] 19 (07/10 0800) BP: (61-121)/(38-78) 100/56 mmHg (07/10 0800) SpO2:  [88 %-100 %] 95 % (07/10 0800) Weight:  [113.9 kg (251 lb 1.7 oz)-115.7 kg (255 lb 1.2 oz)] 115.7 kg (255 lb 1.2 oz) (07/10 0444) HEMODYNAMICS: CVP:  [18 mmHg-22 mmHg] 18 mmHg VENTILATOR SETTINGS:   INTAKE / OUTPUT: Intake/Output     07/09 0701 - 07/10 0700 07/10 0701 - 07/11  0700   P.O. 240    I.V. (mL/kg) 677.8 (5.9)    Total Intake(mL/kg) 917.8 (7.9)    Urine (mL/kg/hr) 90    Total Output 90     Net +827.8            PHYSICAL EXAMINATION:  Gen: chronically ill appearing, breathing comfortably on NRB HEENT: NCAT, PERRL, EOMi, OP clear, neck supple without masses PULM: few crackles in bases bilaterally CV: RRR, distant heart sounds, cannot assess JVD due to neck fat AB: BS+, soft, nontender, no hsm Ext: warm, massive chronic edema, no clubbing, no cyanosis Derm: multiple small skin ulcerations (bug bites?) throughout extremities Neuro: awake and alert, answering questions, moving all four extremities   LABS: - see individual A and P  CXR: massive  cardiomegaly, lungs clear  ASSESSMENT / PLAN:  PULMONARY  Recent Labs Lab 07/12/12 2205 07/13/12 0200 07/13/12 0822  PHART 7.356  --  7.309*  PCO2ART 24.1*  --  34.1*  PO2ART 150.0*  --  78.0*  HCO3 13.1*  --  17.2*  TCO2 13.9  --  18  O2SAT 100.0 62.0 95.0    A: Acute on chronic Hypoxemic respiratory failure > presumably due to volume overload in setting of heart failure; baseline 2L   OSA  No clear evidence of underlying pulmonary disease 2010 PFT: ratio 98%, FEV1 1.80L (71%), FVC 1.84 (56% pred), decrease with bronchodilator; TLC 3.75 L (77% pred), ERV 0.36L (32% pred); DLCO 22.91 (91% pred)  07/13/2012: Stable on 2 L nasal cannula. But she has mild metabolic acidosis presumably from renal failure but currently compensating well  P:   -hold further fluids -O2 as needed -BIPAP if needed -CPAP QHS minimum  CARDIOVASCULAR  Recent Labs Lab 07/12/12 1645  PROBNP 22696.0*     Recent Labs Lab 07/12/12 2239 07/13/12 0215  TROPONINI <0.30 <0.30    Recent Labs Lab 07/12/12 2245 07/13/12 0450  PROCALCITON 0.84 0.77    A:  Pulmonary hypertension: presumably Class II/III due to OSA and Diastolic dysfunction; no RHC data yet  CHF > initially thought to be near compensated on admission, but with worsening shock and hypoxemia now looking decompensated  Shock> presumably cardiogenic related to volume overload (not uncommon scenario with large dilated RV); consider hypovolemia and sepsis, but does not appear septic  - 07/13/2012: Will be started on milrinone. Mixed venous saturation 62%. CVP 26.  P:  -Per cardiology. Might need right heart catheterization  RENAL  Recent Labs Lab 07/06/12 0918 07/06/12 1431 07/07/12 0425 07/12/12 1645 07/13/12 0450  NA 138 139 137 126* 128*  K 3.6 3.9 3.4* 5.1 4.8  CL 98 101 99 90* 95*  CO2 _0 18* 17*  GLUCOSE 160* 117* 82 94 80  BUN _1 84* 85*  CREATININE 1.04 1.00 1.05 4.55* 4.69*  CALCIUM 8.9 8.7 8.7  8.7 8.0*  MG 1.9 1.9 1.8  --   --    Intake/Output     07/09 0701 - 07/10 0700 07/10 0701 - 07/11 0700   P.O. 240    I.V. (mL/kg) 677.8 (5.9)    Total Intake(mL/kg) 917.8 (7.9)    Urine (mL/kg/hr) 90    Total Output 90     Net +827.8            A:  AKI > presumably volume loss from diarrhea. CVP 26 and with Rt hear failure tough to asssess volume status P:   -foley -normal saline at 75cc/h (current CVP 26) -  BMET now again in AM - Might need renal consultation as she's developing acidosis but will see response of creat to milrinone and normal saline first  GASTROINTESTINAL No results found for this basename: AST, ALT, ALKPHOS, BILITOT, PROT, ALBUMIN, INR,  in the last 168 hours  A:  Diarrhea > viral. C. difficile negative  P:   -Await  norovirus -diet as tolerated  HEMATOLOGIC  Recent Labs Lab 07/06/12 0918 07/12/12 1645 07/13/12 0450  HGB 13.0 12.5 12.0  HCT 41.5 37.8 36.3  WBC 7.4 8.5 7.7  PLT 271 321 297   No results found for this basename: INR,  in the last 168 hours  A:  No acute issues P:  Monitor  INFECTIOUS  Recent Labs Lab 07/06/12 0918 07/12/12 1645 07/13/12 0450  WBC 7.4 8.5 7.7    Recent Labs Lab 07/12/12 2245 07/13/12 0100 07/13/12 0450  LATICACIDVEN  --  2.5*  --   PROCALCITON 0.84  --  0.77    A:  Viral gastroenteritis? Pro-CAL and does not suggest severe sepsis P:   -Monitor  ENDOCRINE CBG (last 3)   Recent Labs  07/12/12 2227  GLUCAP 83     A:  DM2 P:   -SSI -check cortisol  NEUROLOGIC A:  No acute issues P:   -Monitor  TODAY'S SUMMARY:   I have personally obtained a history, examined the patient, evaluated laboratory and imaging results, formulated the assessment and plan and placed orders. CRITICAL CARE: The patient is critically ill with multiple organ systems failure and requires high complexity decision making for assessment and support, frequent evaluation and titration of therapies, application of  advanced monitoring technologies and extensive interpretation of multiple databases. Critical Care Time devoted to patient care services described in this note is 35 minutes.     Dr. Brand Males, M.D., [facility].C.P Pulmonary and Critical Care Medicine Staff Physician [facility] Pager: [phone], If no answer or between  15:00h - 7:00h: call [phone]  07/13/2012 9:52 AM

## 2012-07-13 NOTE — Consult Note (Addendum)
WOC consult Note Reason for Consult: Consult requested for bilat toe wounds.  Pt has several chronic full thickness wounds which are followed as an outpatient by the wound care center.  She is familiar to [facility] team; refer to 6/29 progress note for previous assessment and plan of care. She states toe injuries occurred when she dropped items on her feet at home. Wound type: Partial thickness stasis ulcers to left leg .5X.5X.1cm and right leg 1X1X.1cm.  Both sites moist, red, with mod yellow drainage and no odor. Plan:  Foam dressing to protect and promote healing.  Measurement: Right 2 toe .8X.8X.1cm 100% dry eschar without odor or drainage. Right 3rd toe.2X.2cm, 100% dry eschar without odor or drainage. Left 2-3 toes .2X.2X.2cm, 50% dry red, 50% eschar, and .3X.3cm 100% dry eschar without odor or drainage.  Dressing procedure/placement/frequency: Pt prefers to continue with present plan of care; painting with betadine and protecting with foam dressings.  She can resume follow-up with the outpatient wound care center after discharge.  Please re-consult if further assistance is needed.  Thank-you,  Julien Girt MSN, [place], [place], [place], [place]

## 2012-07-13 NOTE — Procedures (Signed)
Central Venous Catheter Insertion Procedure Note [name] [id number] January 06, 1960  Procedure: Insertion of Central Venous Catheter Indications: Assessment of intravascular volume and Drug and/or fluid administration  Procedure Details Consent: Risks of procedure as well as the alternatives and risks of each were explained to the (patient/caregiver).  Consent for procedure obtained. Time Out: Verified patient identification, verified procedure, site/side was marked, verified correct patient position, special equipment/implants available, medications/allergies/relevent history reviewed, required imaging and test results available.  Performed  Maximum sterile technique was used including antiseptics, cap, gloves, gown, hand hygiene, mask and sheet. Skin prep: Chlorhexidine; local anesthetic administered A antimicrobial bonded/coated triple lumen catheter was placed in the left internal jugular vein using the Seldinger technique.  Ultrasound was used to verify the patency of the vein and for real time needle guidance.  Evaluation Blood flow good Complications: No apparent complications Patient did tolerate procedure well. Chest X-ray ordered to verify placement.  CXR: normal.  [name] 07/13/2012, 1:04 AM

## 2012-07-13 NOTE — Progress Notes (Signed)
[facility]  Patient Status: Active (receiving services up to time of hospitalization)  [facility] is providing the following services: RN  If patient discharges after hours, please call [phone].   Janae Sauce 07/13/2012, 9:59 AM

## 2012-07-13 NOTE — Progress Notes (Signed)
Called by bedside RN regarding increase O2 needs and low BP despite fluid boluses. Upon arrival to room patient's O2 sats 85-92% with SOB. SBP 80s, MD at bedside. ABG drawn, CCM consulted for pressors. Patient transferred to ICU for further monitoring.

## 2012-07-13 NOTE — Plan of Care (Signed)
Problem: Phase I Progression Outcomes Goal: Voiding-avoid urinary catheter unless indicated Outcome: Not Met (add Reason) Foley in placed pt critically ill at this time.

## 2012-07-13 NOTE — H&P (Signed)
INTERNAL MEDICINE TEACHING SERVICE Attending Admission Note  Date: 07/13/2012  Patient name: [name]  Medical record number: [id number]  Date of birth: 1960/05/04    I have seen and evaluated [name] and discussed their care with the Residency Team.  89 yr. Old female w/ hx of Chronic diastolic CHF, cor pulmonale, Type 2 DM, OSA, morbid obesity, presented due to diarrhea.  She was noted to be in AKI and hypotensive. She was volume resuscitated and developed hypoxemia to 85% on 45% VM.  PCCM was consulted and transferred to the ICU. Noted to be persistently hypotensive. L IJ CVL inserted.  Now on dopamine gtt, hemodynamically stable and on VM. PCCM has taken over care along with cardiology.   Dominic Pea, [place] 7/10/20143:49 PM

## 2012-07-13 NOTE — Progress Notes (Signed)
Advanced Heart Failure Rounding Note   Subjective:     [name] is a 52 y.o. 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 Referred by Dr. Ginette Barker for further management of [place] and HF.   Admitted to [facility] 6/27 through 07/07/12 with increased dyspnea placed on lasix drip and transitioned to torsemide 80 mg daily. Diuresed 22 pounds. MRSA lower extremity ulcers. Discharge weight 249 pounds.   Admitted through clinic yesterday for hypotension and renal failure in setting of nausea/vomitting/diarrhea. Did not improve much with IVF then developed respiratory failure. Now on dopamine and BiPAP. SBP low 80s.   CVP 20. Co-ox 62%   Objective:   Weight Range:  Vital Signs:   Temp:  [97.5 F (36.4 C)-98.1 F (36.7 C)] 97.9 F (36.6 C) (07/10 0600) Pulse Rate:  [63-103] 93 (07/10 0630) Resp:  [14-26] 18 (07/10 0630) BP: (61-121)/(38-78) 97/56 mmHg (07/10 0630) SpO2:  [88 %-100 %] 100 % (07/10 0630) Weight:  [113.9 kg (251 lb 1.7 oz)-115.7 kg (255 lb 1.2 oz)] 115.7 kg (255 lb 1.2 oz) (07/10 0444)    Weight change: Filed Weights   07/12/12 2028 07/13/12 0444  Weight: 113.9 kg (251 lb 1.7 oz) 115.7 kg (255 lb 1.2 oz)    Intake/Output:   Intake/Output Summary (Last 24 hours) at 07/13/12 0650 Last data filed at 07/13/12 0600  Gross per 24 hour  Intake 835.25 ml  Output     60 ml  Net 775.25 ml     Physical Exam: General: Morbidly obese. With facemask. Somnolent HEENT: normal  Neck: supple. JVP difficult to assess d/t body habitus.. No lymphadenopathy or thryomegaly appreciated.  Cor: PMI nonpalpable. RRR. +Right sided s3 Lungs: clear  Abdomen: soft, nontender, nondistended. No hepatosplenomegaly. No bruits or masses. Good bowel sounds.  Extremities: no cyanosis, clubbing, rash, RLE dressing  in place. LLE tr edema  Neuro: alert & orientedx3, cranial nerves grossly intact. moves all 4 extremities w/o difficulty. Affect pleasant    Telemetry: NSR 90  Labs: Basic Metabolic Panel:  Recent Labs Lab 07/06/12 0918 07/06/12 1431 07/07/12 0425 07/12/12 1645 07/13/12 0450  NA 138 139 137 126* 128*  K 3.6 3.9 3.4* 5.1 4.8  CL 98 101 99 90* 95*  CO2 _0 18* 17*  GLUCOSE 160* 117* 82 94 80  BUN _1 84* 85*  CREATININE 1.04 1.00 1.05 4.55* 4.69*  CALCIUM 8.9 8.7 8.7 8.7 8.0*  MG 1.9 1.9 1.8  --   --     Liver Function Tests: No results found for this basename: AST, ALT, ALKPHOS, BILITOT, PROT, ALBUMIN,  in the last 168 hours No results found for this basename: LIPASE, AMYLASE,  in the last 168 hours No results found for this basename: AMMONIA,  in the last 168 hours  CBC:  Recent Labs Lab 07/06/12 0918 07/12/12 1645 07/13/12 0450  WBC 7.4 8.5 7.7  NEUTROABS  --  4.6  --   HGB 13.0 12.5 12.0  HCT 41.5 37.8 36.3  MCV 80.6 77.3* 77.6*  PLT 271 321 297    Cardiac Enzymes:  Recent Labs Lab 07/12/12 2239 07/13/12 0215  TROPONINI <0.30 <0.30    BNP: BNP (last 3 results)  Recent Labs  02/11/12 1519 02/14/12 0700 07/12/12 1645  PROBNP 3892.0* 3408.0* 22696.0*     Other results:  Imaging: Dg Chest 1 View  07/12/2012   *RADIOLOGY REPORT*  Clinical Data: Shortness of breath.  Hypertension.  Diabetic. Nonsmoker.  CHEST - 1 VIEW  Comparison: 06/30/2012.  Findings: Marked cardiomegaly.  Mild prominence aortic knob.  Central pulmonary vascular prominence without pulmonary edema.  No segmental infiltrate or gross pneumothorax.  Evaluation retrocardiac region limited by the heart size.  IMPRESSION: Marked cardiomegaly and central pulmonary vascular prominence similar to the prior exam.  Please see above.   Original Report Authenticated By: Genia Del, M.D.   Dg Chest Port 1 View  07/13/2012   *RADIOLOGY REPORT*  Clinical Data: Central line  placement.  PORTABLE CHEST - 1 VIEW  Comparison: 07/12/2012  Findings: Interval placement of a left central venous catheter with tip over the mid SVC region and directed laterally.  No pneumothorax.  Shallow inspiration.  Cardiac enlargement is seen previously.  Central pulmonary vascular prominence again demonstrated.  No blunting of costophrenic angles.  No developing consolidation.  IMPRESSION: Left central venous catheter placed with tip over the mid SVC region.  No pneumothorax.   Original Report Authenticated By: Lucienne Capers, M.D.      Medications:     Scheduled Medications: . antiseptic oral rinse  15 mL Mouth Rinse Q4H  . Chlorhexidine Gluconate Cloth  6 each Topical Q0600  . heparin  5,000 Units Subcutaneous Q8H  . insulin aspart  0-15 Units Subcutaneous TID WC  . mupirocin ointment  1 application Nasal BID  . pantoprazole  20 mg Oral BID  . simvastatin  20 mg Oral q1800  . sodium chloride  3 mL Intravenous Q12H     Infusions: . sodium chloride 75 mL/hr at 07/13/12 0037  . DOPamine 3.7 mcg/kg/min (07/13/12 0515)     PRN Medications:  acetaminophen, acetaminophen, albuterol, benzonatate, loratadine, ondansetron (ZOFRAN) IV, ondansetron   Assessment:   1) Acute and chronic respiratory failure  2) Shock - likely mixed picture but primarily cardiogenic at this point due to cor pulmonale 3) Pulmonary HTN  4) Morbid obesity  5) A/C renal failure  6) Mental retardation  7) Recent n/v/diarrhea   Plan/Discussion:    I suspect the sequence of event is that she developed hypotension and renal failure due to relative volume depletion in the setting of end-stage right heart failure due to longstanding OHS/diastolic HF.  She has now been volume repleted but still remains quite tenuous from respiratory and hemodynamic standpoint. That said, her LE edema is sitll much better than previous. Co-ox improved on dopamine. Renal failure seems to be plateauing.  Would repeat ABG  now.   My plan for today would be to continue dopamine and attempt to add low-dose milrinone. If not improving would have low threshold to place PA catheter to further guide therapy. D/w Dr [name]. Would not give extra fluid at this point.   I discussed code status with her and she confirmed full code. Prognosis quite poor.   The patient is critically ill with multiple organ systems failure and requires high complexity decision making for assessment and support, frequent evaluation and titration of therapies, application of advanced monitoring technologies and extensive interpretation of multiple databases.   Critical Care Time devoted to patient care services described in this note is 40 Minutes.   Length of Stay: 1 Glori Bickers 07/13/2012, 6:50 AM  Advanced Heart Failure Team Pager [phone] (M-F; 7a - 4p)  Please contact [facility] for night-coverage after hours (4p -7a ) and weekends on [email or web address]

## 2012-07-13 NOTE — Progress Notes (Signed)
Patient started on CPAP via V60 and full face mask per order. Tolerating well at this time. RT will continue to monitor.

## 2012-07-13 NOTE — Progress Notes (Signed)
HF evening rounds  Remains very tenuous. On dopamine 6. Now resting. Gets presyncopal with cough suggesting end-stage RHF and preload dependence.  Will plan placement of PA cath in am. Options likely limited. Not sure if she would be [place] candidate.  Danna Casella,MD 5:59 PM

## 2012-07-14 ENCOUNTER — Encounter (HOSPITAL_BASED_OUTPATIENT_CLINIC_OR_DEPARTMENT_OTHER): Payer: Medicaid Other | Attending: General Surgery

## 2012-07-14 ENCOUNTER — Inpatient Hospital Stay (HOSPITAL_COMMUNITY): Payer: Medicaid Other

## 2012-07-14 DIAGNOSIS — N189 Chronic kidney disease, unspecified: Secondary | ICD-10-CM

## 2012-07-14 DIAGNOSIS — R570 Cardiogenic shock: Secondary | ICD-10-CM

## 2012-07-14 LAB — BASIC METABOLIC PANEL
CO2: 18 mEq/L — ABNORMAL LOW (ref 19–32)
Calcium: 8.7 mg/dL (ref 8.4–10.5)
Glucose, Bld: 131 mg/dL — ABNORMAL HIGH (ref 70–99)
Potassium: 4.6 mEq/L (ref 3.5–5.1)
Sodium: 127 mEq/L — ABNORMAL LOW (ref 135–145)

## 2012-07-14 LAB — POCT I-STAT 3, ART BLOOD GAS (G3+)
O2 Saturation: 91 %
TCO2: 17 mmol/L (ref 0–100)
pCO2 arterial: 29.6 mmHg — ABNORMAL LOW (ref 35.0–45.0)
pO2, Arterial: 62 mmHg — ABNORMAL LOW (ref 80.0–100.0)

## 2012-07-14 LAB — URINE CULTURE
Colony Count: NO GROWTH
Culture: NO GROWTH

## 2012-07-14 LAB — CARBOXYHEMOGLOBIN
Carboxyhemoglobin: 1.4 % (ref 0.5–1.5)
O2 Saturation: 58.1 %

## 2012-07-14 LAB — GLUCOSE, CAPILLARY: Glucose-Capillary: 104 mg/dL — ABNORMAL HIGH (ref 70–99)

## 2012-07-14 MED ORDER — FENTANYL CITRATE 0.05 MG/ML IJ SOLN
50.0000 ug | Freq: Once | INTRAMUSCULAR | Status: AC
  Administered 2012-07-14: 50 ug via INTRAVENOUS

## 2012-07-14 MED ORDER — MIDAZOLAM HCL 2 MG/2ML IJ SOLN
INTRAMUSCULAR | Status: AC
  Filled 2012-07-14: qty 2

## 2012-07-14 MED ORDER — SODIUM CHLORIDE 0.9 % IV SOLN: INTRAVENOUS | Status: DC

## 2012-07-14 MED ORDER — MIDAZOLAM HCL 2 MG/2ML IJ SOLN
2.0000 mg | Freq: Once | INTRAMUSCULAR | Status: AC
  Administered 2012-07-14: 2 mg via INTRAVENOUS

## 2012-07-14 MED ORDER — FENTANYL CITRATE 0.05 MG/ML IJ SOLN
INTRAMUSCULAR | Status: AC
  Filled 2012-07-14: qty 2

## 2012-07-14 NOTE — Discharge Summary (Signed)
[name] MRN: [id number] DOB/AGE: 06/26/1960 52 y.o.  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 female with history of morbid obesity, mild mental retardation, type- 2 diabetes mellitus, diastolic HF, cor pulmonale with severe pulm artery HTN, chronic venous stasis disease, CRI (1.5) obstructive sleep apnea on CPAP and restrictive lung disease (FVC 54% predicted and FEV1 68% predicted w minimal bronchodilator response - DLCO not done)   She was diagnosed with HF/PAD in 2/14 when she was admitted to the hospital with severe volume overload. ECHO with LVEF 55-60% with grade 1 diastolic DD. RV severely dilated and HK with RVSP 89 and moderate pericardial effusion. She was diuresed from 297 pounds down to 255 and felt much better. She followed up with her PCP and Wound Care team for LE wounds. Over past few months she continued to regain weight and was eventually referred to the HF Clinic on 06/26/12. Weight at that time was 270. She was felt to have severe PH due to combination of diastolic HF, OSA and OHS. She was switched from lasix to demadex and she was scheduled for f/u in 1 week to arrange for VQ scan, PFTs and RHC once volume status was better. ANA, ANCA, Hepatitis panels, Barn[place]-70, HIV all negative.  Unfortunately she was admitted on 6/27 to 05/07/12 with worsening volume overload and venous stasis ulcer. She diuresed 22 pounds with IV lasix to a weight of 249 and creatinine improved from 1.8 to 1.0 on d/c. She was discharged on demadex at 57m daily.  She was scheduled to f/u in the HF clinic on 07/12/2012 to arrange oupatient RHC.    She presented to HF clinic on 7/9 and was markedly hypotensive with SBP 60-70s. Weight 250 pounds. She had had several days of N/Vdiarrhea. She received 1L of fluids in clinic with no response and was admitted for further work-up. Labs back with Cr 4.22 (peaked at 4.7). Sodium 128. WBC, procaclitonin and lactate were normal as were c.diff PCR, UA and BCX. She received more IVF and then developed mild respiratory distress and was placed on face mask and given 1 dose of IV lasix. Dopamine initiated and SBP came up to 100-110 range. She was felt to have end-stage cor pulmonale and low-dose milrinone was started but she developed recurrent  hypotension. We were able to maintain her SBP 95-105 range on dopamine at 741m/kg/min. Renalfunction began to improve and Cr was 3.8 this am.   SwGordy Councilmanatheter was placed confirming cor pulmonale.  PA Cath numbers:   On dopamine 7.5 mcg/kg/min  RA 20  PAP 95/49  PCWP 15  SVR 2067  PVR 16.6 Woods  CO/CI 2.9/1.38   I then [facility] Dr[facility] Fo[facility]illes [facility] and initiation of IV epoprostenol.    Discharge Info: Blood pressure 90/55, pulse 99, temperature 97.9 F (36.6 C), temperature source Core (Comment), resp. rate 18, height _0  (1.6 m), weight 116.7 kg (257 lb 4.4 oz), SpO2 90.00%.   Physical Exam:  General: Morbidly obese. With facemask.  HEENT: normal x for poor dentition Neck: supple. RIJ swan LIJ TLC  No lymphadenopathy or thryomegaly appreciated.  Cor: PMI nonpalpable.  RRR. +RV lift and right sided s3  Lungs: clear anteriorly Abdomen: soft, nontender, mildly distended. No bruits or masses. Good bowel sounds.  Extremities: no cyanosis, clubbing, rash, RLE dressing in place. LLE tr-1+ edema  Neuro: alert & orientedx3, cranial nerves grossly intact. moves all 4 extremities w/o difficulty. Affect pleasant    Weight change: 2.8 kg (6 lb 2.8 oz) Results for orders placed during the hospital encounter of 07/12/12 (from the past  24 hour(s))  GLUCOSE, CAPILLARY     Status: Abnormal   Collection Time    07/13/12  9:09 PM      Result Value Range   Glucose-Capillary 148 (*) 70 - 99 mg/dL  PROCALCITONIN     Status: None   Collection Time    07/14/12  3:41 AM      Result Value Range   Procalcitonin 1.98    BASIC METABOLIC PANEL     Status: Abnormal   Collection Time    07/14/12  3:41 AM      Result Value Range   Sodium 127 (*) 135 - 145 mEq/L   Potassium 4.6  3.5 - 5.1 mEq/L   Chloride 92 (*) 96 - 112 mEq/L   CO2 18 (*) 19 - 32 mEq/L   Glucose, Bld 131 (*) 70 - 99 mg/dL   BUN 89 (*) 6 - 23 mg/dL   Creatinine, Ser 3.86 (*) 0.50 - 1.10 mg/dL   Calcium 8.7  8.4 - 10.5 mg/dL   GFR calc non Af Amer 13 (*) >90 mL/min   GFR calc Af Amer 15 (*) >90 mL/min  GLUCOSE, CAPILLARY     Status: Abnormal   Collection Time    07/14/12  7:54 AM      Result Value Range   Glucose-Capillary 104 (*) 70 - 99 mg/dL  CARBOXYHEMOGLOBIN     Status: None   Collection Time    07/14/12  8:55 AM      Result Value Range   Total hemoglobin 12.0  12.0 - 16.0 g/dL   O2 Saturation 58.1     Carboxyhemoglobin 1.4  0.5 - 1.5 %   Methemoglobin 1.1  0.0 - 1.5 %  GLUCOSE, CAPILLARY     Status: Abnormal   Collection Time    07/14/12 12:13 PM      Result Value Range   Glucose-Capillary 158 (*) 70 - 99 mg/dL  GLUCOSE, CAPILLARY     Status: Abnormal   Collection Time    07/14/12  6:46 PM      Result Value Range   Glucose-Capillary 108 (*) 70 - 99 mg/dL     Discharge Medications:  1) Dopamine 7.5 mcg/kg/min 2) Heparin 5,000 SQ q8 3) Pantoprazole 19m po bid 4) Sliding scale insulin 5) Tessalon Perles 100 tid 6) Albuterol/atrovent nebs prn    Follow-up Plans & Instructions:  Transfer to [facility] for evaluation for IV epoprostenol therapy and further w/u of PAH.    Time spent with patient to include physician time:55 minutes Signed:  DGlori Bickers MD 07/14/2012, 7:50 PM

## 2012-07-14 NOTE — Progress Notes (Addendum)
  PA Cath numbers:  On dopamine 7.5 mcg/kg/min  RA  20 PAP 95/49 PCWP 15 SVR 2067 PVR 16.6 Woods CO/CI  2.9/1.38  Has failed trial of milrinone due to hypotension. I have contacted Dr. Gilles Chiquito at [facility] for possible transfer and initiation of IV epoprostenol. They have accepted her pending a bed.  I have spoke to Ms. Spindle at length about the situation. She has contacted her son.   We will continue dopamine for BP and RV support. If deteriorates we will need to start NO.   The patient is critically ill with multiple organ systems failure and requires high complexity decision making for assessment and support, frequent evaluation and titration of therapies, application of advanced monitoring technologies and extensive interpretation of multiple databases.   Critical Care Time devoted to patient care services described in this note is 45 Minutes. Not including time spent placing PA catheter.     [name] 7:48 PM

## 2012-07-14 NOTE — Progress Notes (Signed)
PULMONARY  / CRITICAL CARE MEDICINE  Name: [name] MRN: [id number] DOB: Nov 04, 1960    ADMISSION DATE:  07/12/2012 CONSULTATION DATE:  07/12/2012  REFERRING MD :  IM teaching service PRIMARY SERVICE: PCCM  CHIEF COMPLAINT:  hypotension    BRIEF  52 y/o female with severe PH, obesity, diastolic dysfunction was admitted on 7/9 with hypotension in the setting of diarrhea; PCCM consulted when hypotension did not improve and hypoxemia developed after volume resuscitation.  She states that she has had diarrhea for several days.  She denies dysuria (says she hasn't urinated much), cough or fever.  She has developed some dyspnea and chest tightness since coming to the hospital.  She denies abdominal pain.  No recent antibiotics that we are aware of. PCCM consulted 07/12/12   has a past medical history of OSA (obstructive sleep apnea); Venous stasis ulcer; GERD (gastroesophageal reflux disease); Anemia, iron deficiency; Chronic cough; Depression; Diabetes mellitus; Hyperlipidemia; H/O mental retardation; Herpes; Cor pulmonale; Diastolic heart failure; Restrictive lung disease; CHF (congestive heart failure); and Renal disorder.  has past surgical history that includes Cholecystectomy.  LINES / TUBES: 7/10 L IJ CVL >>  CULTURES: 7/9 blood >> 7/9 c.diff >> NEG 07/12/12 - MRSA PCR - POSITIVE 07/13/12 - Urine - NEG     ANTIBIOTICS: Anti-infectives   None        SIGNIFICANT EVENTS / STUDIES:  7/9 admission   07/13/2012: She is not intubated but she is on 4 L nasal cannula. She is alert and oriented. She is on dopamine but this will be switched to milrinone by cardiology. CVP is 26. C. difficile negative    SUBJECTIVE/OVERNIGHT/INTERVAL HX 07/14/12 - failed milrinone - dropped BP. Back on dopamine 45mg. Nomral sal;ine [facility] to [facility]/h. CVP 23. Creat better. Going for right heart cath today. Did well CPAP last night a VITAL SIGNS: Temp:  [98.2 F (36.8 C)-99 F (37.2 C)] 98.2 F  (36.8 C) (07/11 0400) Pulse Rate:  [85-116] 103 (07/11 0700) Resp:  [15-28] 19 (07/11 0700) BP: (59-108)/(37-78) 100/78 mmHg (07/11 0700) SpO2:  [88 %-99 %] 95 % (07/11 0700) FiO2 (%):  [35 %-60 %] 50 % (07/11 0500) Weight:  [116.7 kg (257 lb 4.4 oz)] 116.7 kg (257 lb 4.4 oz) (07/11 0456) HEMODYNAMICS: CVP:  [18 mmHg-22 mmHg] 21 mmHg VENTILATOR SETTINGS: Vent Mode:  [-]  FiO2 (%):  [35 %-60 %] 50 % INTAKE / OUTPUT: Intake/Output     07/10 0701 - 07/11 0700 07/11 0701 - 07/12 0700   P.O. 860    I.V. (mL/kg) 604.2 (5.2)    Total Intake(mL/kg) 1464.2 (12.5)    Urine (mL/kg/hr) 1130 (0.4)    Total Output 1130     Net +334.2            PHYSICAL EXAMINATION:  Gen: chronically ill appearing, breathing comfortably on NRB HEENT: NCAT, PERRL, EOMi, OP clear, neck supple without masses PULM: few crackles in bases bilaterally CV: RRR, distant heart sounds, cannot assess JVD due to neck fat AB: BS+, soft, nontender, no hsm Ext: warm, massive chronic edema, no clubbing, no cyanosis Derm: multiple small skin ulcerations (bug bites?) throughout extremities Neuro: awake and alert, answering questions, moving all four extremities   LABS: - see individual A and P  IMAGING x 48h Dg Chest 1 View  07/12/2012   *RADIOLOGY REPORT*  Clinical Data: Shortness of breath.  Hypertension.  Diabetic. Nonsmoker.  CHEST - 1 VIEW  Comparison: 06/30/2012.  Findings: Marked cardiomegaly.  Mild  prominence aortic knob.  Central pulmonary vascular prominence without pulmonary edema.  No segmental infiltrate or gross pneumothorax.  Evaluation retrocardiac region limited by the heart size.  IMPRESSION: Marked cardiomegaly and central pulmonary vascular prominence similar to the prior exam.  Please see above.   Original Report Authenticated By: Genia Del, M.D.   Dg Chest Port 1 View  07/13/2012   *RADIOLOGY REPORT*  Clinical Data: Central line placement.  PORTABLE CHEST - 1 VIEW  Comparison: 07/12/2012   Findings: Interval placement of a left central venous catheter with tip over the mid SVC region and directed laterally.  No pneumothorax.  Shallow inspiration.  Cardiac enlargement is seen previously.  Central pulmonary vascular prominence again demonstrated.  No blunting of costophrenic angles.  No developing consolidation.  IMPRESSION: Left central venous catheter placed with tip over the mid SVC region.  No pneumothorax.   Original Report Authenticated By: Lucienne Capers, M.D.     ASSESSMENT / PLAN:  PULMONARY  Recent Labs Lab 07/12/12 2205 07/13/12 0200 07/13/12 8675 07/14/12 0524 07/14/12 0855  PHART 7.356  --  7.309* 7.355  --   PCO2ART 24.1*  --  34.1* 29.6*  --   PO2ART 150.0*  --  78.0* 62.0*  --   HCO3 13.1*  --  17.2* 16.5*  --   TCO2 13.9  --  18 17  --   O2SAT 100.0 62.0 95.0 91.0 58.1    A: Acute on chronic Hypoxemic respiratory failure > presumably due to volume overload in setting of heart failure; baseline 2L   OSA  No clear evidence of underlying pulmonary disease 2010 PFT: ratio 98%, FEV1 1.80L (71%), FVC 1.84 (56% pred), decrease with bronchodilator; TLC 3.75 L (77% pred), ERV 0.36L (32% pred); DLCO 22.91 (91% pred)  07/13/2012: Stable on 2 L nasal cannula.    P:   -hold further fluids -O2 as needed -BIPAP if needed -CPAP QHS minimum  CARDIOVASCULAR  Recent Labs Lab 07/12/12 1645  PROBNP 22696.0*      Recent Labs Lab 07/12/12 2239 07/13/12 0215 07/13/12 1100  TROPONINI <0.30 <0.30 <0.30    Recent Labs Lab 07/12/12 2245 07/13/12 0450 07/14/12 0341  PROCALCITON 0.84 0.77 0.76    A:  Pulmonary hypertension: presumably Class II/III due to OSA and Diastolic dysfunction; no RHC data yet  CHF > initially thought to be near compensated on admission, but with worsening shock and hypoxemia now looking decompensated  Shock> presumably cardiogenic related to volume overload (not uncommon scenario with large dilated RV); consider  hypovolemia and sepsis, but does not appear septic  - 07/14/2012: Did not tolerate milrinone. Currently on dopamine. Due for right heart cath today  P:  -Per cardiology.  right heart catheterization  RENAL  Recent Labs Lab 07/12/12 1645 07/13/12 0450 07/14/12 0341  NA 126* 128* 127*  K 5.1 4.8 4.6  CL 90* 95* 92*  CO2 18* 17* 18*  GLUCOSE 94 80 131*  BUN 84* 85* 89*  CREATININE 4.55* 4.69* 3.86*  CALCIUM 8.7 8.0* 8.7   Intake/Output     07/10 0701 - 07/11 0700 07/11 0701 - 07/12 0700   P.O. 860    I.V. (mL/kg) 604.2 (5.2)    Total Intake(mL/kg) 1464.2 (12.5)    Urine (mL/kg/hr) 1130 (0.4)    Total Output 1130     Net +334.2            A:  AKI > presumably volume loss from diarrhea. CVP 26 and with Rt hear failure  tough to asssess volume status 07/14/2012: Improving renal function with dopamine  P:   -foley - monitor  GASTROINTESTINAL No results found for this basename: AST, ALT, ALKPHOS, BILITOT, PROT, ALBUMIN, INR,  in the last 168 hours  A:  Diarrhea > viral. C. difficile negative  P:   -Await  norovirus -diet as tolerated  HEMATOLOGIC  Recent Labs Lab 07/12/12 1645 07/13/12 0450  HGB 12.5 12.0  HCT 37.8 36.3  WBC 8.5 7.7  PLT 321 297   No results found for this basename: INR,  in the last 168 hours  A:  No acute issues P:  Monitor  INFECTIOUS  Recent Labs Lab 07/12/12 1645 07/13/12 0450  WBC 8.5 7.7    Recent Labs Lab 07/12/12 2245 07/13/12 0100 07/13/12 0450 07/14/12 0341  LATICACIDVEN  --  2.5*  --   --   PROCALCITON 0.84  --  0.77 0.76    A:  Viral gastroenteritis? Pro-CAL and does not suggest severe sepsis P:   -Monitor  ENDOCRINE CBG (last 3)   Recent Labs  07/13/12 1750 07/13/12 2109 07/14/12 0754  GLUCAP 124* 148* 104*     A:  DM2 P:   -SSI -check cortisol  NEUROLOGIC A:  No acute issues P:   -Monitor    Global 07/14/2012: No family at the bedside. Pulmonary critical care medicine will be on  standby if needed but will not plan to see the patient over the weekend 07/15/2012 and 07/16/2012  Dr. Brand Males, M.D., F.C.C.P Pulmonary and Critical Care Medicine Staff Physician [facility] Pager: [phone], If no answer or between  15:00h - 7:00h: call [phone]  07/14/2012 10:11 AM

## 2012-07-14 NOTE — Progress Notes (Addendum)
Advanced Heart Failure Rounding Note   Subjective:     [name] is a 52 y.o. 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 Referred by Dr. Ginette Otto for further management of [place] and HF.   Admitted to [facility] 6/27 through 07/07/12 with increased dyspnea placed on lasix drip and transitioned to torsemide 80 mg daily. Diuresed 22 pounds. MRSA lower extremity ulcers. Discharge weight 249 pounds.   Admitted from clinic 7/9 for hypotension and renal failure in setting of nausea/vomitting/diarrhea. Did not improve much with IVF then developed respiratory failure. Milrinone switched yesterday to dopamine d/t hypotension and now is on 7.62mg. Overnight making more UOP and SBP better 100-115s. On non-rebreather. Cr improving 3.86  CVP 23. Co-ox 58%  Objective:   Weight Range:  Vital Signs:   Temp:  [98.2 F (36.8 C)-99 F (37.2 C)] 98.2 F (36.8 C) (07/11 0400) Pulse Rate:  [85-116] 103 (07/11 0700) Resp:  [15-31] 19 (07/11 0700) BP: (59-108)/(37-78) 100/78 mmHg (07/11 0700) SpO2:  [88 %-99 %] 95 % (07/11 0700) FiO2 (%):  [35 %-100 %] 50 % (07/11 0500) Weight:  [257 lb 4.4 oz (116.7 kg)] 257 lb 4.4 oz (116.7 kg) (07/11 0456) Last BM Date: 07/11/12  Weight change: Filed Weights   07/12/12 2028 07/13/12 0444 07/14/12 0456  Weight: 251 lb 1.7 oz (113.9 kg) 255 lb 1.2 oz (115.7 kg) 257 lb 4.4 oz (116.7 kg)    Intake/Output:   Intake/Output Summary (Last 24 hours) at 07/14/12 0807 Last data filed at 07/14/12 0700  Gross per 24 hour  Intake 1456.3 ml  Output   1130 ml  Net  326.3 ml     Physical Exam: General: Morbidly obese. With facemask.  HEENT: normal  Neck: supple. CVP 23 No lymphadenopathy or thryomegaly appreciated.  Cor: PMI nonpalpable. RRR. +Right sided s3 Lungs: clear  Abdomen:  soft, nontender, nondistended. No hepatosplenomegaly. No bruits or masses. Good bowel sounds.  Extremities: no cyanosis, clubbing, rash, RLE dressing in place. LLE tr-1+ edema  Neuro: alert & orientedx3, cranial nerves grossly intact. moves all 4 extremities w/o difficulty. Affect pleasant    Telemetry: NSR 90-100s  Labs: Basic Metabolic Panel:  Recent Labs Lab 07/12/12 1645 07/13/12 0450 07/14/12 0341  NA 126* 128* 127*  K 5.1 4.8 4.6  CL 90* 95* 92*  CO2 18* 17* 18*  GLUCOSE 94 80 131*  BUN 84* 85* 89*  CREATININE 4.55* 4.69* 3.86*  CALCIUM 8.7 8.0* 8.7    Liver Function Tests: No results found for this basename: AST, ALT, ALKPHOS, BILITOT, PROT, ALBUMIN,  in the last 168 hours No results found for this basename: LIPASE, AMYLASE,  in the last 168 hours No results found for this basename: AMMONIA,  in the last 168 hours  CBC:  Recent Labs Lab 07/12/12 1645 07/13/12 0450  WBC 8.5 7.7  NEUTROABS 4.6  --   HGB 12.5 12.0  HCT 37.8 36.3  MCV 77.3* 77.6*  PLT 321 297    Cardiac Enzymes:  Recent Labs Lab 07/12/12 2239 07/13/12 0215 07/13/12 1100  TROPONINI <0.30 <0.30 <0.30    BNP: BNP (last 3 results)  Recent Labs  02/11/12 1519 02/14/12 0700 07/12/12 1645  PROBNP 3892.0* 3408.0* 22696.0*     Other results:   Imaging: Dg Chest 1 View  07/12/2012   *RADIOLOGY REPORT*  Clinical Data: Shortness of breath.  Hypertension.  Diabetic. Nonsmoker.  CHEST - 1 VIEW  Comparison: 06/30/2012.  Findings: Marked cardiomegaly.  Mild prominence aortic knob.  Central pulmonary vascular prominence without pulmonary edema.  No segmental infiltrate or gross pneumothorax.  Evaluation retrocardiac region limited by the heart size.  IMPRESSION: Marked cardiomegaly and central pulmonary vascular prominence similar to the prior exam.  Please see above.   Original Report Authenticated By: Genia Del, M.D.   Dg Chest Port 1 View  07/13/2012   *RADIOLOGY REPORT*  Clinical  Data: Central line placement.  PORTABLE CHEST - 1 VIEW  Comparison: 07/12/2012  Findings: Interval placement of a left central venous catheter with tip over the mid SVC region and directed laterally.  No pneumothorax.  Shallow inspiration.  Cardiac enlargement is seen previously.  Central pulmonary vascular prominence again demonstrated.  No blunting of costophrenic angles.  No developing consolidation.  IMPRESSION: Left central venous catheter placed with tip over the mid SVC region.  No pneumothorax.   Original Report Authenticated By: Lucienne Capers, M.D.     Medications:     Scheduled Medications: . antiseptic oral rinse  15 mL Mouth Rinse Q4H  . Chlorhexidine Gluconate Cloth  6 each Topical Q0600  . heparin  5,000 Units Subcutaneous Q8H  . insulin aspart  0-15 Units Subcutaneous TID WC  . mupirocin ointment  1 application Nasal BID  . pantoprazole  20 mg Oral BID  . simvastatin  20 mg Oral q1800  . sodium chloride  3 mL Intravenous Q12H    Infusions: . sodium chloride 10 mL/hr at 07/13/12 1516  . DOPamine 7.3 mcg/kg/min (07/14/12 0600)    PRN Medications: acetaminophen, acetaminophen, albuterol, benzonatate, loratadine, ondansetron (ZOFRAN) IV, ondansetron   Assessment:   1) Acute and chronic respiratory failure  2) Shock - likely mixed picture but primarily cardiogenic at this point due to cor pulmonale 3) Pulmonary HTN  4) Morbid obesity  5) A/C renal failure  6) Mental retardation  7) Recent n/v/diarrhea   Plan/Discussion:    Patient stable overnight and slight improvement. Her UOP is picking up and Cr improving. CVP is trending up now 23 this am and weight is up 2 lbs. Would like to try slow diuresis with her 20 mg lasix IV, however have to be careful with severe RHF.  Will place PA catheter today to get a better idea of hemodynamics. . Continue dopamine at 7.3 mcg. SBP 100-115s.   Patient is critically ill and has a poor prognosis. Have discussed code status  with her and at this time wants to remain full code.   The patient is critically ill with multiple organ systems failure and requires high complexity decision making for assessment and support, frequent evaluation and titration of therapies, application of advanced monitoring technologies and extensive interpretation of multiple databases.   Critical Care Time devoted to patient care services described in this note is 40 Minutes.   Length of Stay: 2 Rande Brunt 07/14/2012, 8:07 AM  Advanced Heart Failure Team Pager [phone] (M-F; 7a - 4p)  Please contact [facility] for night-coverage after hours (4p -7a ) and weekends on [email or web address]  Patient seen and examined with Junie Bame, NP. We discussed all aspects of the encounter. I agree with the assessment and plan as stated above.  She appears to have end-stage R heart failure. Now dopamine dependent. She has failed milrinone due to hypotension. I think options are very limited. Will place PA catheter to help  guide therapy. Would try to diurese gently as renal function and BP tolerate.   Daniel Bensimhon,MD 5:22 PM

## 2012-07-14 NOTE — Procedures (Signed)
Central Venous Catheter Insertion Procedure Note XAVIERA FLATEN [id number] April 27, 1960  Procedure: Insertion of PA Catheter Indications: Assessment of intravascular volume and PA pressures  Procedure Details Consent: Risks of procedure as well as the alternatives and risks of each were explained to the (patient/caregiver).  Consent for procedure obtained. Time Out: Verified patient identification, verified procedure, site/side was marked, verified correct patient position, special equipment/implants available, medications/allergies/relevent history reviewed, required imaging and test results available.  Performed  Maximum sterile technique was used including antiseptics, cap, gloves, gown, hand hygiene, mask and sheet. Skin prep: Chlorhexidine; local anesthetic administered A 7.5 FR venous sheath was placed in the right internal jugular vein using the Seldinger technique. The sheath was sewn in place. A PA catheter was then maneuvered into position using pressure wave guidance.  Evaluation Blood flow good Complications: No apparent complications Patient did tolerate procedure well. Chest X-ray ordered to verify placement.  CXR: pending.  Daniel Bensimhon 07/14/2012, 6:14 PM

## 2012-07-14 NOTE — ED Provider Notes (Signed)
I evaluated the patient and discussed her care with our resident physician. I agree with his documentation, with the following additions.  (I also saw the relevant studies, including ECG (if performed) and agree with the interpretation).  This patient presents in extremis, hypotensive, from an outpatient clinic.  Notably, the patient's multiple medical problems, including congestive heart failure. During the patient's emergency department course she was resuscitated with IV fluids, and continue to have patent airway, with appropriate mental status.  However, the patient's evaluation demonstrated evidence of acute kidney failure, and acute heart failure decompensation.  This combination required deliberate resuscitation, and ultimately placement in a step down unit for her critical illness. Throughout the patient's course, on multiple repeat evaluations she remained hypotensive, but alert. We discussed her case at length, and with the assistance of her cardiology team, the internal medicine team, resuscitation was continued.  CRITICAL CARE Performed by: Carmin Muskrat Total critical care time: 35 Critical care time was exclusive of separately billable procedures and treating other patients. Critical care was necessary to treat or prevent imminent or life-threatening deterioration. Critical care was time spent personally by me on the following activities: development of treatment plan with patient and/or surrogate as well as nursing, discussions with consultants, evaluation of patient's response to treatment, examination of patient, obtaining history from patient or surrogate, ordering and performing treatments and interventions, ordering and review of laboratory studies, ordering and review of radiographic studies, pulse oximetry and re-evaluation of patient's condition.   Carmin Muskrat, MD 07/14/12 [phone]

## 2012-07-18 ENCOUNTER — Encounter: Payer: Self-pay | Admitting: Internal Medicine

## 2012-07-18 ENCOUNTER — Ambulatory Visit: Payer: Self-pay | Admitting: Internal Medicine

## 2012-07-19 LAB — CULTURE, BLOOD (ROUTINE X 2): Culture: NO GROWTH

## 2012-07-26 ENCOUNTER — Institutional Professional Consult (permissible substitution): Payer: Self-pay | Admitting: Pulmonary Disease

## 2012-08-01 ENCOUNTER — Encounter: Payer: Self-pay | Admitting: Internal Medicine

## 2012-08-06 ENCOUNTER — Encounter (HOSPITAL_COMMUNITY): Payer: Self-pay | Admitting: Emergency Medicine

## 2012-08-06 ENCOUNTER — Emergency Department (HOSPITAL_COMMUNITY)
Admission: EM | Admit: 2012-08-06 | Discharge: 2012-08-06 | Disposition: A | Discharge status: Home / Self Care | Payer: Medicaid Other | Attending: Emergency Medicine | Admitting: Emergency Medicine

## 2012-08-06 DIAGNOSIS — R0602 Shortness of breath: Secondary | ICD-10-CM | POA: Insufficient documentation

## 2012-08-06 DIAGNOSIS — R0681 Apnea, not elsewhere classified: Secondary | ICD-10-CM | POA: Insufficient documentation

## 2012-08-06 DIAGNOSIS — Z79899 Other long term (current) drug therapy: Secondary | ICD-10-CM | POA: Insufficient documentation

## 2012-08-06 DIAGNOSIS — E119 Type 2 diabetes mellitus without complications: Secondary | ICD-10-CM | POA: Insufficient documentation

## 2012-08-06 DIAGNOSIS — R059 Cough, unspecified: Secondary | ICD-10-CM | POA: Insufficient documentation

## 2012-08-06 DIAGNOSIS — Z8619 Personal history of other infectious and parasitic diseases: Secondary | ICD-10-CM | POA: Insufficient documentation

## 2012-08-06 DIAGNOSIS — R21 Rash and other nonspecific skin eruption: Secondary | ICD-10-CM | POA: Insufficient documentation

## 2012-08-06 DIAGNOSIS — I509 Heart failure, unspecified: Secondary | ICD-10-CM | POA: Insufficient documentation

## 2012-08-06 DIAGNOSIS — K219 Gastro-esophageal reflux disease without esophagitis: Secondary | ICD-10-CM | POA: Insufficient documentation

## 2012-08-06 DIAGNOSIS — M792 Neuralgia and neuritis, unspecified: Secondary | ICD-10-CM

## 2012-08-06 DIAGNOSIS — Z87448 Personal history of other diseases of urinary system: Secondary | ICD-10-CM | POA: Insufficient documentation

## 2012-08-06 DIAGNOSIS — Z8659 Personal history of other mental and behavioral disorders: Secondary | ICD-10-CM | POA: Insufficient documentation

## 2012-08-06 DIAGNOSIS — Z8679 Personal history of other diseases of the circulatory system: Secondary | ICD-10-CM | POA: Insufficient documentation

## 2012-08-06 DIAGNOSIS — I2789 Other specified pulmonary heart diseases: Secondary | ICD-10-CM | POA: Insufficient documentation

## 2012-08-06 DIAGNOSIS — Z862 Personal history of diseases of the blood and blood-forming organs and certain disorders involving the immune mechanism: Secondary | ICD-10-CM | POA: Insufficient documentation

## 2012-08-06 DIAGNOSIS — Z8669 Personal history of other diseases of the nervous system and sense organs: Secondary | ICD-10-CM | POA: Insufficient documentation

## 2012-08-06 DIAGNOSIS — Z8709 Personal history of other diseases of the respiratory system: Secondary | ICD-10-CM | POA: Insufficient documentation

## 2012-08-06 DIAGNOSIS — M79609 Pain in unspecified limb: Secondary | ICD-10-CM | POA: Insufficient documentation

## 2012-08-06 DIAGNOSIS — E785 Hyperlipidemia, unspecified: Secondary | ICD-10-CM | POA: Insufficient documentation

## 2012-08-06 DIAGNOSIS — R05 Cough: Secondary | ICD-10-CM | POA: Insufficient documentation

## 2012-08-06 HISTORY — DX: Pulmonary hypertension, unspecified: I27.20

## 2012-08-06 LAB — COMPREHENSIVE METABOLIC PANEL
Alkaline Phosphatase: 149 U/L — ABNORMAL HIGH (ref 39–117)
BUN: 29 mg/dL — ABNORMAL HIGH (ref 6–23)
Calcium: 9 mg/dL (ref 8.4–10.5)
Creatinine, Ser: 1.37 mg/dL — ABNORMAL HIGH (ref 0.50–1.10)
GFR calc Af Amer: 51 mL/min — ABNORMAL LOW (ref >90)
Glucose, Bld: 92 mg/dL (ref 70–99)
Potassium: 3.1 mEq/L — ABNORMAL LOW (ref 3.5–5.1)
Total Protein: 6.9 g/dL (ref 6.0–8.3)

## 2012-08-06 LAB — CBC
HCT: 31.9 % — ABNORMAL LOW (ref 36.0–46.0)
Hemoglobin: 10.2 g/dL — ABNORMAL LOW (ref 12.0–15.0)
MCH: 24.8 pg — ABNORMAL LOW (ref 26.0–34.0)
MCHC: 32 g/dL (ref 30.0–36.0)
MCV: 77.6 fL — ABNORMAL LOW (ref 78.0–100.0)
RDW: 19.8 % — ABNORMAL HIGH (ref 11.5–15.5)

## 2012-08-06 LAB — PRO B NATRIURETIC PEPTIDE: Pro B Natriuretic peptide (BNP): 3626 pg/mL — ABNORMAL HIGH (ref 0–125)

## 2012-08-06 LAB — MAGNESIUM: Magnesium: 1.6 mg/dL (ref 1.5–2.5)

## 2012-08-06 MED ORDER — POTASSIUM CHLORIDE CRYS ER 20 MEQ PO TBCR
40.0000 meq | EXTENDED_RELEASE_TABLET | Freq: Once | ORAL | Status: AC
  Administered 2012-08-06: 40 meq via ORAL
  Filled 2012-08-06: qty 2

## 2012-08-06 MED ORDER — DIPHENHYDRAMINE HCL 25 MG PO CAPS
25.0000 mg | ORAL_CAPSULE | Freq: Once | ORAL | Status: AC
  Administered 2012-08-06: 25 mg via ORAL
  Filled 2012-08-06: qty 1

## 2012-08-06 NOTE — ED Notes (Signed)
Pt. Given a cup of water.

## 2012-08-06 NOTE — ED Notes (Signed)
Pt c/o bilateral foot and leg pain, sts it started this year, has been dx with neuropathy. Pt has multiple bandages on bilateral legs. Pt sts she had infections, has been seen for these. Pt has redness to lower legs and 3+ pitting edema. Pt sts she thinks the sores are healing. Pt has a hickman with medication going through it but is unsure which medicine is going through at the moment, sts it is for her pulmonary HTN. Pt in nad, skin warm and dry, resp e/u.

## 2012-08-06 NOTE — ED Notes (Signed)
Per EMS - pt c/o bilateral leg pain, pt has hx of neupropathy, given new prescription but its not working. Pt sts she has a new rash to both legs and right arm. Rates pain in feet at "20/10" and legs "5/10". BP 110/78 HR 100 CBG 131. Pt wears 4L/min at home all the time, recent dx of CHF and pulmonary HTN. Pt has hickman pump.

## 2012-08-06 NOTE — ED Provider Notes (Signed)
CSN: [id number]     Arrival date & time 08/06/12  1139 History     First MD Initiated Contact with Patient 08/06/12 1152     Chief Complaint  Patient presents with  . Leg Pain  . Rash   (Consider location/radiation/quality/duration/timing/severity/associated sxs/prior Treatment) HPI [name] is a 52 year old female with an extensive PMH (see below) she has had multiple recent hospitalizations related to CHF.  She was most recently addmitted to [facility] on 07/12/12 where she was hypotensive, hypoxic and with AKI.  She was transferred to the ICU and and found to have pulmonary hypertension.  She was treated with dopamine and then transferred to [facility] where she was treated with remodulin. Patient was then discharged from [facility] on 7/31.  Patient reports that since discharge she has had sharp pains in her feet that radiate to ankles, this has kept her from sleeping.  Records from [place] show that her gabapentin was increased to TID and she was discharged with Neurotin 654m TID and lidocaine ointment for pain, her son reports that the pharmacy did not have the neurotin ready and he did not pick up that medication on Friday.  She has been taking only the lidocaine ointment for pain.  In addition patient complains of a rash on left leg and left arm.  She reports that the rash started ~2 days ago, she reports it is itchy She does admit 2 pillow orthopnea, reports her SOB is at baseline and she is on 4L O2 by [place] at home.  She denies increased swelling or weight gain since discharge.  Past Medical History  Diagnosis Date  . OSA (obstructive sleep apnea)     CPAP  . Venous stasis ulcer     chornic, ?followed up at wound care center, multiple courses of antibiotics in past for cellulitis, on lasix  . GERD (gastroesophageal reflux disease)   . Anemia, iron deficiency     secondary to menhorrhagia, on oral iron, also b12 def, getting monthly b12 shots  . Chronic cough     secondary to alleriges and post nasal  drip  . Depression   . Diabetes mellitus     well controlled on metformin  . Hyperlipidemia   . H/O mental retardation   . Herpes   . Cor pulmonale     PA Peak pressure 81mg  . Diastolic heart failure   . Restrictive lung disease     PFTs 06/2012 (FVC 54% predicted and FEV1 68% predicted w minimal bronchodilator response).  . CHF (congestive heart failure)   . Renal disorder   . Pulmonary hypertension    Past Surgical History  Procedure Laterality Date  . Cholecystectomy     Family History  Problem Relation Age of Onset  . Mental illness Sister   . Mental retardation Brother    History  Substance Use Topics  . Smoking status: Never Smoker   . Smokeless tobacco: Never Used  . Alcohol Use: No   OB History   Grav Para Term Preterm Abortions TAB SAB Ect Mult Living   _0 Review of Systems  Constitutional: Negative for fever, chills, fatigue and unexpected weight change.  HENT: Negative for facial swelling.   Eyes: Negative for visual disturbance.  Respiratory: Positive for apnea and shortness of breath. Negative for cough, wheezing and stridor.   Cardiovascular: Negative for chest pain, palpitations and leg swelling.  Gastrointestinal: Negative for  nausea, abdominal pain, diarrhea, constipation and abdominal distention.  Genitourinary: Negative for dysuria and difficulty urinating.  Skin: Positive for rash.  Neurological: Negative for dizziness, light-headedness, numbness and headaches.    Allergies  Aspirin; Codeine; Lisinopril; and Sulfonamide derivatives  Home Medications   Current Outpatient Rx  Name  Route  Sig  Dispense  Refill  . albuterol (PROVENTIL HFA;VENTOLIN HFA) 108 (90 BASE) MCG/ACT inhaler   Inhalation   Inhale 2 puffs into the lungs every 6 (six) hours as needed for wheezing.   1 Inhaler   2   . benzonatate (TESSALON) 100 MG capsule   Oral   Take 1 capsule (100 mg total) by mouth 3 (three) times daily as needed for cough.    90 capsule   0   . fluticasone (FLONASE) 50 MCG/ACT nasal spray   Nasal   Place 2 sprays into the nose daily.   16 g   3   . metFORMIN (GLUCOPHAGE) 1000 MG tablet   Oral   Take 1,000 mg by mouth 2 (two) times daily with a meal.         . ondansetron (ZOFRAN) 4 MG tablet   Oral   Take 4 mg by mouth every 8 (eight) hours as needed for nausea.         . pantoprazole (PROTONIX) 20 MG tablet   Oral   Take 1 tablet (20 mg total) by mouth 2 (two) times daily.   180 tablet   4   . potassium chloride SA (K-DUR,KLOR-CON) 20 MEQ tablet   Oral   Take 2 tablets (40 mEq total) by mouth daily.   60 tablet   0   . pravastatin (PRAVACHOL) 20 MG tablet   Oral   Take 20 mg by mouth daily.         Marland Kitchen torsemide (DEMADEX) 100 MG tablet   Oral   Take 100 mg by mouth daily.         . vitamin B-12 (CYANOCOBALAMIN) 250 MCG tablet   Oral   Take 250 mcg by mouth daily.          BP 93/59  Temp(Src) 98.9 F (37.2 C) (Oral)  Resp 15  Ht _0  (1.6 m)  Wt 233 lb 3.2 oz (105.779 kg)  BMI 41.32 kg/m2  SpO2 98% Physical Exam  Constitutional: She is oriented to person, place, and time. She appears well-developed and well-nourished.  HENT:  Head: Normocephalic and atraumatic.  Eyes: Conjunctivae and EOM are normal. Pupils are equal, round, and reactive to light. No scleral icterus.  Cardiovascular: Normal rate and normal heart sounds.  Exam reveals no gallop and no friction rub.   No murmur heard. Pulmonary/Chest: Effort normal. No respiratory distress. She has no wheezes. She has no rales.  Abdominal: Soft. She exhibits no distension. There is no tenderness. There is no rebound and no guarding.  Musculoskeletal: She exhibits edema (non pitting edema of lower extremities b/l).  Neurological: She is alert and oriented to person, place, and time. No cranial nerve deficit.  Skin: Skin is warm. Rash (left lower extremity behind knee, left upper extremity lateral arm.  fine red bumps, no  errythema) noted.    ED Course   Procedures (including critical care time)  Labs Reviewed  CBC - Abnormal; Notable for the following:    Hemoglobin 10.2 (*)    HCT 31.9 (*)    MCV 77.6 (*)    MCH 24.8 (*)    RDW 19.8 (*)  All other components within normal limits  PRO B NATRIURETIC PEPTIDE - Abnormal; Notable for the following:    Pro B Natriuretic peptide (BNP) 3626.0 (*)    All other components within normal limits  COMPREHENSIVE METABOLIC PANEL   No results found. No diagnosis found.  MDM  Foot pain  - Patient did not pick up gabapentin prescription at pharmacy. Encouraged patient and son to pick up prescription and take as prescribed. -Patient's son has now gone to pharmacy and filled prescription.  Rash -Patient reports rash appeared in last 2 days.  Is not warm or erythematous, may be side effect of remodulin (Lexicomp- 14% skin rash adverse reaction).   Puritis 4:45pm patient complained of generalized puritis, will give benadryl 31m po.  May be side effect of Remodulin.  Patient to [facility] for follow up appointment and discuss.   Core pulmonale -ProBNP is 3626 much decreased from previous hospitalization.  Patient oxygenating well on 4L [place] (same as home dose)  Hypokalemia -K+ 3.1, patient takes 475m of K-Dur daily, gave additional 4050mtoday, will check Mg level- 1.6.  Given information for high potassium foods.  Anemia of chronic disease -Hg stable, [facility].  Will follow up with PCP on 08/10/12, to [facility] for [facility]U appointment.  EriJoni ReiningO 08/06/12 1739

## 2012-08-06 NOTE — ED Provider Notes (Signed)
I saw and evaluated the patient, reviewed the resident's note and I agree with the findings and plan.   .Face to face Exam:  General:  Awake HEENT:  Atraumatic Resp:  Normal effort Abd:  Nondistended Neuro:No focal weakness  Dot Lanes, MD 08/06/12 1742

## 2012-08-10 ENCOUNTER — Ambulatory Visit (INDEPENDENT_AMBULATORY_CARE_PROVIDER_SITE_OTHER): Payer: Medicaid Other | Admitting: Internal Medicine

## 2012-08-10 ENCOUNTER — Encounter: Payer: Self-pay | Admitting: Internal Medicine

## 2012-08-10 VITALS — BP 95/59 | HR 109 | Temp 97.7°F | Ht 62.0 in | Wt 230.0 lb

## 2012-08-10 DIAGNOSIS — G4733 Obstructive sleep apnea (adult) (pediatric): Secondary | ICD-10-CM

## 2012-08-10 DIAGNOSIS — L97909 Non-pressure chronic ulcer of unspecified part of unspecified lower leg with unspecified severity: Secondary | ICD-10-CM

## 2012-08-10 DIAGNOSIS — I2781 Cor pulmonale (chronic): Secondary | ICD-10-CM

## 2012-08-10 DIAGNOSIS — J984 Other disorders of lung: Secondary | ICD-10-CM

## 2012-08-10 DIAGNOSIS — E669 Obesity, unspecified: Secondary | ICD-10-CM

## 2012-08-10 DIAGNOSIS — I509 Heart failure, unspecified: Secondary | ICD-10-CM

## 2012-08-10 DIAGNOSIS — I5032 Chronic diastolic (congestive) heart failure: Secondary | ICD-10-CM

## 2012-08-10 DIAGNOSIS — I50810 Right heart failure, unspecified: Secondary | ICD-10-CM

## 2012-08-10 DIAGNOSIS — I83009 Varicose veins of unspecified lower extremity with ulcer of unspecified site: Secondary | ICD-10-CM

## 2012-08-10 DIAGNOSIS — I279 Pulmonary heart disease, unspecified: Secondary | ICD-10-CM

## 2012-08-10 DIAGNOSIS — E1149 Type 2 diabetes mellitus with other diabetic neurological complication: Secondary | ICD-10-CM

## 2012-08-10 LAB — COMPLETE METABOLIC PANEL WITH GFR
Albumin: 3.2 g/dL — ABNORMAL LOW (ref 3.5–5.2)
Alkaline Phosphatase: 120 U/L — ABNORMAL HIGH (ref 39–117)
BUN: 38 mg/dL — ABNORMAL HIGH (ref 6–23)
CO2: 33 mEq/L — ABNORMAL HIGH (ref 19–32)
Calcium: 8.5 mg/dL (ref 8.4–10.5)
Chloride: 94 mEq/L — ABNORMAL LOW (ref 96–112)
GFR, Est African American: 49 mL/min — ABNORMAL LOW
GFR, Est Non African American: 42 mL/min — ABNORMAL LOW
Glucose, Bld: 102 mg/dL — ABNORMAL HIGH (ref 70–99)
Potassium: 3.7 mEq/L (ref 3.5–5.3)
Sodium: 137 mEq/L (ref 135–145)
Total Protein: 6.7 g/dL (ref 6.0–8.3)

## 2012-08-10 MED ORDER — LORATADINE 10 MG PO TABS: 10.0000 mg | ORAL_TABLET | Freq: Every day | ORAL | Status: DC

## 2012-08-10 NOTE — Assessment & Plan Note (Signed)
Patient states she is compliant with CPAP.   -very important to continue consistent use of CPAP as OSA/OSH likely playing large role in [facility]

## 2012-08-10 NOTE — Assessment & Plan Note (Addendum)
Assessment:  Pt has severe pulmonary hypertension with cor pulmonale likely due to OSA/OHS and diasolic heart failure.  See HPI for details about her recent 3 week hospitalization at [facility] for this issue (discharged on 7/31).  Pt doing well and not dyspneic today; she is compliant with CPAP, following a 1551m fluid restricted diet, and taking daily weights.  She is currently on torsemide 1093mdaily, additional torsemide with increased weight per [facility] cardiologist Dr. FoGilles Chiquitopt calls in to nurse Cristal when weight up and meds are adjusted accordingly).  Dr. FoGilles Chiquitos managing patient's Remodulin continuous infusion and her diuresis.    Plan: -CBC with diff, CMP today -continue Remodulin, torsemide, CPAP, daily weights, fluid restriction -follow-up with Dr. BeTempie Hoistn 8/21 at 3:20pm -follow-up with Dr. FoGilles Chiquiton 3 weeks as previously scheduled -follow-up with PCP Jones in 3-4 weeks

## 2012-08-10 NOTE — Progress Notes (Signed)
[name], female   DOB: 1960/02/14, 52 y.o.   MRN: [id number]   Subjective:   [name]   DOB: [birth year] 52 y.o.   MRN: [id number]  HPI: [name] is a 52 y.o. woman with mild mental retardation, cor pulmonale with severe pulmonary hypertension (PAP 62/95),  diastolic heart failure (EF 55% in 7/14), restrictive lung disease, OSA/OHS, chronic venous stasis, morbid obesity who was recently discharged from [facility] for treatment of severe pulmonary hypertension with cardiogenic shock and AKI presents to clinic today for hospital follow-up.  At [facility], patient initially required dopamine and IV diuresis, was started on IV epoprostenol in the CCU, converted to Remodulin upon transfer to the floor.  She was discharged on continuous Remodulin infusion (36 ng/kg/min 40 mL/24hrs with 15 mg/1108m concentration) via Hickman catheter, this is [facility](as is her diuresis). Remodulin cassette is changed every other day which patient is able to do with assistance from her son. Sleep-disordered breathing and diastolic heart failure appear to be the drivers of patient's cor pulmonale. She had a VQ scan, ANA, HIV test which were all negative, [facility]  In addition, CT scan of the chest showed no significant lung disease but an area of left lower lobe collapse due to her enlarged heart and having been bedridden for several weeks.  She was diuresed 11 L over the course of the hospitalization.  At discharge, she was instructed to be compliant with her CPAP and placed on torsemide with instructions to call Dr. FSharion Balloonoffice if her weight increases more than 2 pounds in one day.  Overall, Ms. MFerranteis doing well today, states she has no complaints.  She is breathing without any difficulty at rest on 4L O2, she is able to walk using her walker though with mild dyspnea.  She states she is currently wearing her CPAP at night.   She is adhering to a 15066mfluid restricted diet, although she was ~20079mver this the last couple of days. She is weighing herself daily and calls Dr. ForSharion Balloonfice if her weight increases by more than 2 pounds/day for torsemide dose adjustment.  She weighed 225.4 lbs this morning.  She does not think she has any swelling above baseline in her abdomen or legs though notes that her legs are always quite swollen.  Patient has pretibial ulcers on both legs; she was followed by wound management while an inpatient and has a home health nurse assisting her with them now. She states they are much improved from prior.    Patient's diabetes has been well-controlled in past with last A1C of 6.0% in 5/14, she was on metformin at home which was restarted at her hospital discharge since her creatinine had trended back to baseline (~1).  She has some chronic tingling in her bilateral feet but no pain since she has been applying lidocaine ointment. Gabapentin was also initiated and up titrated during her hospital admission, she is now on 600 mg TID. Patient states she would like to get a glucometer so she can log her fasting blood glucose daily.    Of note, patient came to the [facility] over the weekend because of difficulty obtaining her gabapentin at the pharmacy due to insurance coverage issues as well as a rash she had developed on the flexor surface of her bilateral forearms.  The rash was thought to be due to medication side effect  from her Remodulin, she has been taking Benadryl for it and states it is improved.  She is also requesting a refill on her loratadine today.    Past Medical History  Diagnosis Date  . OSA (obstructive sleep apnea)     CPAP  . Venous stasis ulcer     chornic, ?followed up at wound care center, multiple courses of antibiotics in past for cellulitis, on lasix  . GERD (gastroesophageal reflux disease)   . Anemia, iron deficiency     secondary to menhorrhagia, on oral iron, also b12  def, getting monthly b12 shots  . Chronic cough     secondary to alleriges and post nasal drip  . Depression   . Diabetes mellitus     well controlled on metformin  . Hyperlipidemia   . H/O mental retardation   . Herpes   . Cor pulmonale     PA Peak pressure 54mHg  . Diastolic heart failure   . Restrictive lung disease     PFTs 06/2012 (FVC 54% predicted and FEV1 68% predicted w minimal bronchodilator response).  . CHF (congestive heart failure)   . Renal disorder   . Pulmonary hypertension    Current Outpatient Prescriptions  Medication Sig Dispense Refill  . albuterol (PROAIR HFA) 108 (90 BASE) MCG/ACT inhaler Inhale into the lungs. Inhale 2 inhalations into the lungs every 6 (six) hours as needed for Wheezing.      . benzonatate (TESSALON) 100 MG capsule Take by mouth. Take 100 mg by mouth 3 (three) times daily as needed for Cough.      . Cyanocobalamin (RA VITAMIN B-12 TR) 1000 MCG TBCR Take by mouth. Take 250 mcg by mouth daily.      . fluticasone (FLONASE) 50 MCG/ACT nasal spray Place into the nose. Place 2 sprays into both nostrils daily.      .Marland Kitchengabapentin (NEURONTIN) 600 MG tablet Take by mouth. Take 1 tablet (600 mg total) by mouth 3 (three) times daily.      .Marland Kitchenketoconazole (NIZORAL) 2 % cream Apply topically. Apply topically 2 (two) times daily.      .Marland Kitchenlidocaine (XYLOCAINE) 5 % ointment Apply topically. Apply topically 3 (three) times daily as needed.      . loratadine (CLARITIN) 10 MG tablet Take by mouth. Take 10 mg by mouth daily.      . metFORMIN (GLUCOPHAGE) 1000 MG tablet Take by mouth. Take 1,000 mg by mouth 2 (two) times daily with meals.      . ondansetron (ZOFRAN) 4 MG tablet Take by mouth. Take 1 tablet (4 mg total) by mouth every 8 (eight) hours as needed for Nausea.      . pantoprazole (PROTONIX) 20 MG tablet Take by mouth. Take 20 mg by mouth 2 (two) times daily.      . potassium chloride SA (K-DUR,KLOR-CON) 20 MEQ tablet Take by mouth. Take 40 mEq by mouth  daily.      . pravastatin (PRAVACHOL) 20 MG tablet Take by mouth. Take 20 mg by mouth daily.      .Marland Kitchentorsemide (DEMADEX) 100 MG tablet Take by mouth. Take 1 tablet (100 mg total) by mouth daily.      .Marland Kitchentreprostinil (REMODULIN) 10 MG/ML SOLN Inject into the vein. Inject 4,176 ng/min into the vein continuously. 36 ng/kg/min 40 ml/24hrs 15 mg/1038mconcentration      . loratadine (CLARITIN) 10 MG tablet Take 1 tablet (10 mg total) by mouth daily.  30 tablet  3  No current facility-administered medications for this visit.   Family History  Problem Relation Age of Onset  . Mental illness Sister   . Mental retardation Brother    History   Social History  . Marital Status: Single    Spouse Name: N/A    Number of Children: N/A  . Years of Education: N/A   Social History Main Topics  . Smoking status: Never Smoker   . Smokeless tobacco: Never Used  . Alcohol Use: No  . Drug Use: No  . Sexually Active: No   Other Topics Concern  . None   Social History Narrative  . None   Review of Systems: Review of Systems  Constitutional: Negative for fever, chills and diaphoresis.  HENT: Negative for hearing loss and sore throat.   Eyes: Negative for blurred vision.  Respiratory: Positive for cough. Negative for sputum production, shortness of breath and wheezing.   Cardiovascular: Negative for chest pain, palpitations and leg swelling.  Gastrointestinal: Negative for nausea, vomiting, abdominal pain, diarrhea and constipation.  Genitourinary: Negative for dysuria.  Musculoskeletal: Negative for back pain, joint pain and falls.  Skin: Positive for rash.  Neurological: Positive for tingling. Negative for dizziness, focal weakness, loss of consciousness, weakness and headaches.    Objective:  Physical Exam: Filed Vitals:   08/10/12 1038 08/10/12 1049  BP: 95/59   Pulse: 109   Temp: 97.7 F (36.5 C)   TempSrc: Oral   Height: _0  (1.575 m)   Weight:  230 lb (104.327 kg)  SpO2: 92%      PEX General: alert, cooperative, and in no apparent distress, ion 4L O2 [place] HEENT: vision grossly intact, oropharynx clear and non-erythematous  Neck: supple, no lymphadenopathy, JVD, or carotid bruits Lungs: clear to ascultation bilaterally, normal work of respiration, no wheezes, rales, ronchi Heart: tachycardic (rate unchanged from hospital discharge vitals), regular rate and rhythm, no murmurs, gallops, or rubs Abdomen: soft, non-tender, non-distended, normal bowel sounds Extremities: 2 cm shallow anterior pre-tibial ulcer without purulent drainage on right leg, 1cm shallow pre-tibial ulcer without drainage on left leg; 2+ non-pitting edema of bilateral lower extremities, erythema consistent with chronic venous stasis; no cyanosis, clubbing Neurologic: alert & oriented X3, cranial nerves II-XII intact, strength grossly intact, sensation intact to light touch  Assessment & Plan:  Patient discussed with Dr. Marinda Elk.  Please see problem-based assessment and plan.  I made pt a follow-up appointment with Dr. Tempie Hoist on 8/21 at 3:20p to follow-up/re-establish local care and left this in a voicemail at her home. She also has follow-up with her cardiologist at [facility], Dr. Gilles Chiquito in 3 weeks.

## 2012-08-10 NOTE — Assessment & Plan Note (Addendum)
Last A1C 6.0% in 5/14.  Patient discharged from [facility] on metformin 1042m BID, will continue.   -Pt asking for glucometer to keep a log of morning BGs.  [facility], will ask DButch PennyPlyler to provide her one at her next visit.  -A1C and flu shot at next visit

## 2012-08-10 NOTE — Patient Instructions (Addendum)
Please follow-up with Dr. Ronnald Ramp (PCP) in 4 weeks.   Show this document to your home health nurse so she has a list of your medicines.  Remember your appointment with Dr. Gilles Chiquito in 3 weeks.  We will call you with an appointment to see Dr. Haroldine Laws in 1-2 weeks.   Pulmonary Hypertension Pulmonary hypertension (PH) is a type of high blood pressure that affects the arteries in your lungs and the right side of your heart. Pulmonary hypertension is a serious condition and can be fatal. There are two types of pulmonary hypertension:  Primary pulmonary hypertension. Primary [place] occurs without a known medical condition.  Secondary pulmonary hypertension. Secondary PH is caused by a known medical condition. CAUSES Pulmonary hypertension begins when arteries in your lungs (pulmonary arteries) become narrowed, blocked or destroyed. This makes it hard for blood to flow through the lung arteries. This causes pressure to build up in the lungs and makes the right side of the heart work harder. Over time, this can weaken the heart muscle. Primary pulmonary hypertension causes:  An inherited (genetic) condition.  An unknown cause. Secondary pulmonary hypertension causes:  Lung diseases such as:  Pulmonary fibrosis.  Chronic obstructive pulmonary disease (COPD).  Emphysema.  Pulmonary emboli (blood clot in the lungs).  Heart failure.  Connective tissue diseases like scleroderma.  Sickle cell anemia.  Chronic liver disease (cirrhosis).  Lupus.  Acquired immune deficiency syndrome (AIDS). SYMPTOMS  Shortness of breath. You may notice shortness of breath with:  Activity such as walking.  No activity. You may be short of breath simply by sitting in a chair.  Tiredness and fatigue.  Dizziness or fainting.  Rapid heart rate or you feel your heart skip beats (palpitations).  Neck vein enlargement.  Bluish color of your lips and fingertips. DIAGNOSIS  Different tests can be used to  check (diagnose) pulmonary hypertension. These can include:  Chest X-ray.  Arterial blood gases. This test checks the oxygen level in your blood.  High resolution computed tomography (CT) or magnetic resonance imaging (MRI). These tests can provide detailed images of your lungs.  Pulmonary function test. This test measures how much air your lungs can hold. It also tests how well air moves in and out of your lungs.  Electrocardiography (EKG). This traces the electrical activity of your heart and prints it out on paper.  Echocardiography. This test can look at your heart in motion and how it functions.  Heart catheterization (angiography). This test can measure the pressure in your pulmonary artery and right side of the heart.  Sometimes a biopsy is taken of your lungs. TREATMENT Pulmonary hypertension has no cure. Treatment can help relieve symptoms and slow the progress of PH. Treatment can involve:  Medications such as:  Blood pressure medications.  Diuretics (water pills).  Blood thinning medications.  For severe pulmonary hypertension that does not respond to medical treatment, a lung transplant my be needed. HOME CARE INSTRUCTIONS  Take all medicines as told by your caregiver. If you have problems with a prescribed medicine, talk to your caregiver. Do not stop taking it on your own.  Do not smoke.  Eat a healthy diet. Avoid a high salt diet. Talk to a dietician (food specialist) about foods you should eat.  Stay as active as possible.  Avoid high altitudes.  Be careful when using a hot tub. A hot tub can lower your blood pressure.  If you have pulmonary hypertension and are female of child bearing age, talk to  your caregiver about using birth control pills or getting pregnant. SEEK IMMEDIATE MEDICAL CARE IF:  You have severe shortness of breath.  You develop chest pain or pressure.  You cough up blood.  You develop swelling of your feet or legs.  You have a  sudden increase in weight. Document Released: 10/18/2006 Document Revised: 03/15/2011 Document Reviewed: 02/02/2009 [facility] Patient Information 2014 [place].   Wound Care Wound care helps prevent pain and infection.  You may need a tetanus shot if:  You cannot remember when you had your last tetanus shot.  You have never had a tetanus shot.  The injury broke your skin. If you need a tetanus shot and you choose not to have one, you may get tetanus. Sickness from tetanus can be serious. HOME CARE   Only take medicine as told by your doctor.  Clean the wound daily with mild soap and water.  Change any bandages (dressings) as told by your doctor.  Put medicated cream and a bandage on the wound as told by your doctor.  Change the bandage if it gets wet, dirty, or starts to smell.  Take showers. Do not take baths, swim, or do anything that puts your wound under water.  Rest and raise (elevate) the wound until the pain and puffiness (swelling) are better.  Keep all doctor visits as told. GET HELP RIGHT AWAY IF:   Yellowish-white fluid (pus) comes from the wound.  Medicine does not lessen your pain.  There is a red streak going away from the wound.  You have a fever. MAKE SURE YOU:   Understand these instructions.  Will watch your condition.  Will get help right away if you are not doing well or get worse. Document Released: 09/30/2007 Document Revised: 03/15/2011 Document Reviewed: 04/26/2010 [facility] Patient Information 2014 [place], [place].

## 2012-08-10 NOTE — Assessment & Plan Note (Signed)
Assessment: Pretibial ulcers on bilateral lower legs being managed by home health nurse.  Patient states they are much improved from prior. On exam today, right pretibial ulcer appox 2 cm in diameter and shallow, left pretibial ulcer approx 1cm and shallow.  Both appear to be healing and are without purluent drainage.   Plan: -continue home health nursing care -pt will follow-up in clinic in 3-4 weeks; re-examine then, if not healed at that time, would refer to wound care clinic at [facility] (pt has been there before)

## 2012-08-11 LAB — CBC WITH DIFFERENTIAL/PLATELET
Basophils Relative: 0 % (ref 0–1)
HCT: 32.3 % — ABNORMAL LOW (ref 36.0–46.0)
Hemoglobin: 10.2 g/dL — ABNORMAL LOW (ref 12.0–15.0)
Lymphs Abs: 1.3 10*3/uL (ref 0.7–4.0)
MCHC: 31.6 g/dL (ref 30.0–36.0)
Monocytes Absolute: 1.3 10*3/uL — ABNORMAL HIGH (ref 0.1–1.0)
Monocytes Relative: 12 % (ref 3–12)
Neutro Abs: 7.5 10*3/uL (ref 1.7–7.7)
Neutrophils Relative %: 75 % (ref 43–77)
RBC: 4.24 MIL/uL (ref 3.87–5.11)

## 2012-08-11 NOTE — Progress Notes (Signed)
I saw and evaluated the patient.  I personally confirmed the key portions of the history and exam documented by Dr. Stann Mainland and I reviewed pertinent patient test results.  The assessment, diagnosis, and plan were formulated together and I agree with the documentation in the resident's note, with the following additional comments.  Patient's renal function had corrected at the time of her hospital discharge, and metformin was resumed at the time of discharge.  Her pre-hospitalization creatinine was normal, but mild post-hospital elevation is concerning and if persistent then we will need to stop metformin and transition to an alternative regimen, possibly insulin.

## 2012-08-16 ENCOUNTER — Inpatient Hospital Stay (HOSPITAL_COMMUNITY): Payer: Medicaid Other

## 2012-08-16 ENCOUNTER — Inpatient Hospital Stay (HOSPITAL_COMMUNITY)
Admission: EM | Admit: 2012-08-16 | Discharge: 2012-08-23 | DRG: 308 | Disposition: A | Discharge status: Home / Self Care | Payer: Medicaid Other | Attending: Internal Medicine | Admitting: Internal Medicine

## 2012-08-16 ENCOUNTER — Emergency Department (HOSPITAL_COMMUNITY): Payer: Medicaid Other

## 2012-08-16 ENCOUNTER — Encounter (HOSPITAL_COMMUNITY): Payer: Self-pay | Admitting: *Deleted

## 2012-08-16 DIAGNOSIS — K219 Gastro-esophageal reflux disease without esophagitis: Secondary | ICD-10-CM | POA: Diagnosis present

## 2012-08-16 DIAGNOSIS — M109 Gout, unspecified: Secondary | ICD-10-CM | POA: Diagnosis present

## 2012-08-16 DIAGNOSIS — I872 Venous insufficiency (chronic) (peripheral): Secondary | ICD-10-CM | POA: Diagnosis present

## 2012-08-16 DIAGNOSIS — L89109 Pressure ulcer of unspecified part of back, unspecified stage: Secondary | ICD-10-CM | POA: Diagnosis present

## 2012-08-16 DIAGNOSIS — F3289 Other specified depressive episodes: Secondary | ICD-10-CM | POA: Diagnosis present

## 2012-08-16 DIAGNOSIS — D509 Iron deficiency anemia, unspecified: Secondary | ICD-10-CM | POA: Diagnosis present

## 2012-08-16 DIAGNOSIS — L8991 Pressure ulcer of unspecified site, stage 1: Secondary | ICD-10-CM | POA: Diagnosis present

## 2012-08-16 DIAGNOSIS — L89899 Pressure ulcer of other site, unspecified stage: Secondary | ICD-10-CM | POA: Diagnosis present

## 2012-08-16 DIAGNOSIS — G4733 Obstructive sleep apnea (adult) (pediatric): Secondary | ICD-10-CM

## 2012-08-16 DIAGNOSIS — I5033 Acute on chronic diastolic (congestive) heart failure: Secondary | ICD-10-CM

## 2012-08-16 DIAGNOSIS — Z810 Family history of intellectual disabilities: Secondary | ICD-10-CM

## 2012-08-16 DIAGNOSIS — R079 Chest pain, unspecified: Secondary | ICD-10-CM

## 2012-08-16 DIAGNOSIS — J4489 Other specified chronic obstructive pulmonary disease: Secondary | ICD-10-CM | POA: Diagnosis present

## 2012-08-16 DIAGNOSIS — F329 Major depressive disorder, single episode, unspecified: Secondary | ICD-10-CM | POA: Diagnosis present

## 2012-08-16 DIAGNOSIS — M940 Chondrocostal junction syndrome [Tietze]: Secondary | ICD-10-CM | POA: Diagnosis present

## 2012-08-16 DIAGNOSIS — L8992 Pressure ulcer of unspecified site, stage 2: Secondary | ICD-10-CM | POA: Diagnosis present

## 2012-08-16 DIAGNOSIS — I9589 Other hypotension: Secondary | ICD-10-CM | POA: Diagnosis present

## 2012-08-16 DIAGNOSIS — I2781 Cor pulmonale (chronic): Secondary | ICD-10-CM

## 2012-08-16 DIAGNOSIS — I272 Pulmonary hypertension, unspecified: Secondary | ICD-10-CM

## 2012-08-16 DIAGNOSIS — L97909 Non-pressure chronic ulcer of unspecified part of unspecified lower leg with unspecified severity: Secondary | ICD-10-CM | POA: Diagnosis present

## 2012-08-16 DIAGNOSIS — Z886 Allergy status to analgesic agent status: Secondary | ICD-10-CM

## 2012-08-16 DIAGNOSIS — E669 Obesity, unspecified: Secondary | ICD-10-CM

## 2012-08-16 DIAGNOSIS — G609 Hereditary and idiopathic neuropathy, unspecified: Secondary | ICD-10-CM | POA: Diagnosis present

## 2012-08-16 DIAGNOSIS — Z888 Allergy status to other drugs, medicaments and biological substances status: Secondary | ICD-10-CM

## 2012-08-16 DIAGNOSIS — E662 Morbid (severe) obesity with alveolar hypoventilation: Secondary | ICD-10-CM | POA: Diagnosis present

## 2012-08-16 DIAGNOSIS — IMO0002 Reserved for concepts with insufficient information to code with codable children: Secondary | ICD-10-CM | POA: Diagnosis present

## 2012-08-16 DIAGNOSIS — Z818 Family history of other mental and behavioral disorders: Secondary | ICD-10-CM

## 2012-08-16 DIAGNOSIS — J449 Chronic obstructive pulmonary disease, unspecified: Secondary | ICD-10-CM | POA: Diagnosis present

## 2012-08-16 DIAGNOSIS — E870 Hyperosmolality and hypernatremia: Secondary | ICD-10-CM | POA: Diagnosis not present

## 2012-08-16 DIAGNOSIS — I129 Hypertensive chronic kidney disease with stage 1 through stage 4 chronic kidney disease, or unspecified chronic kidney disease: Secondary | ICD-10-CM | POA: Diagnosis present

## 2012-08-16 DIAGNOSIS — I5032 Chronic diastolic (congestive) heart failure: Secondary | ICD-10-CM

## 2012-08-16 DIAGNOSIS — I959 Hypotension, unspecified: Secondary | ICD-10-CM | POA: Diagnosis present

## 2012-08-16 DIAGNOSIS — G3184 Mild cognitive impairment, so stated: Secondary | ICD-10-CM | POA: Diagnosis present

## 2012-08-16 DIAGNOSIS — I5031 Acute diastolic (congestive) heart failure: Secondary | ICD-10-CM

## 2012-08-16 DIAGNOSIS — I440 Atrioventricular block, first degree: Secondary | ICD-10-CM | POA: Diagnosis present

## 2012-08-16 DIAGNOSIS — E119 Type 2 diabetes mellitus without complications: Secondary | ICD-10-CM | POA: Diagnosis present

## 2012-08-16 DIAGNOSIS — Z882 Allergy status to sulfonamides status: Secondary | ICD-10-CM

## 2012-08-16 DIAGNOSIS — E785 Hyperlipidemia, unspecified: Secondary | ICD-10-CM

## 2012-08-16 DIAGNOSIS — I509 Heart failure, unspecified: Secondary | ICD-10-CM

## 2012-08-16 DIAGNOSIS — F70 Mild intellectual disabilities: Secondary | ICD-10-CM | POA: Diagnosis present

## 2012-08-16 DIAGNOSIS — L8995 Pressure ulcer of unspecified site, unstageable: Secondary | ICD-10-CM | POA: Diagnosis present

## 2012-08-16 DIAGNOSIS — I50810 Right heart failure, unspecified: Secondary | ICD-10-CM

## 2012-08-16 DIAGNOSIS — I83009 Varicose veins of unspecified lower extremity with ulcer of unspecified site: Secondary | ICD-10-CM | POA: Diagnosis present

## 2012-08-16 DIAGNOSIS — L89309 Pressure ulcer of unspecified buttock, unspecified stage: Secondary | ICD-10-CM | POA: Diagnosis present

## 2012-08-16 DIAGNOSIS — E1149 Type 2 diabetes mellitus with other diabetic neurological complication: Secondary | ICD-10-CM

## 2012-08-16 DIAGNOSIS — L270 Generalized skin eruption due to drugs and medicaments taken internally: Secondary | ICD-10-CM | POA: Diagnosis present

## 2012-08-16 DIAGNOSIS — I4892 Unspecified atrial flutter: Principal | ICD-10-CM

## 2012-08-16 DIAGNOSIS — N179 Acute kidney failure, unspecified: Secondary | ICD-10-CM

## 2012-08-16 DIAGNOSIS — Z9089 Acquired absence of other organs: Secondary | ICD-10-CM

## 2012-08-16 DIAGNOSIS — I2789 Other specified pulmonary heart diseases: Secondary | ICD-10-CM | POA: Diagnosis present

## 2012-08-16 DIAGNOSIS — E876 Hypokalemia: Secondary | ICD-10-CM | POA: Diagnosis not present

## 2012-08-16 DIAGNOSIS — Z6841 Body Mass Index (BMI) 40.0 and over, adult: Secondary | ICD-10-CM

## 2012-08-16 DIAGNOSIS — J309 Allergic rhinitis, unspecified: Secondary | ICD-10-CM

## 2012-08-16 DIAGNOSIS — N183 Chronic kidney disease, stage 3 unspecified: Secondary | ICD-10-CM | POA: Diagnosis present

## 2012-08-16 HISTORY — DX: Chronic obstructive pulmonary disease, unspecified: J44.9

## 2012-08-16 HISTORY — DX: Shortness of breath: R06.02

## 2012-08-16 HISTORY — DX: Essential (primary) hypertension: I10

## 2012-08-16 LAB — URINE MICROSCOPIC-ADD ON

## 2012-08-16 LAB — CBC
HCT: 31.5 % — ABNORMAL LOW (ref 36.0–46.0)
HCT: 32.6 % — ABNORMAL LOW (ref 36.0–46.0)
Hemoglobin: 10.3 g/dL — ABNORMAL LOW (ref 12.0–15.0)
Hemoglobin: 10.6 g/dL — ABNORMAL LOW (ref 12.0–15.0)
MCH: 24.9 pg — ABNORMAL LOW (ref 26.0–34.0)
MCH: 24.8 pg — ABNORMAL LOW (ref 26.0–34.0)
MCHC: 32.7 g/dL (ref 30.0–36.0)
MCV: 76.1 fL — ABNORMAL LOW (ref 78.0–100.0)
MCV: 76.3 fL — ABNORMAL LOW (ref 78.0–100.0)
Platelets: 273 10*3/uL (ref 150–400)
Platelets: 284 10*3/uL (ref 150–400)
RBC: 4.14 MIL/uL (ref 3.87–5.11)
RBC: 4.27 MIL/uL (ref 3.87–5.11)
RDW: 19.8 % — ABNORMAL HIGH (ref 11.5–15.5)
WBC: 9.7 10*3/uL (ref 4.0–10.5)
WBC: 9.7 10*3/uL (ref 4.0–10.5)

## 2012-08-16 LAB — TROPONIN I
Troponin I: <0.3 ng/mL (ref <0.30)
Troponin I: <0.3 ng/mL (ref <0.30)

## 2012-08-16 LAB — BASIC METABOLIC PANEL
BUN: 55 mg/dL — ABNORMAL HIGH (ref 6–23)
CO2: 28 mEq/L (ref 19–32)
Calcium: 8.6 mg/dL (ref 8.4–10.5)
Chloride: 96 mEq/L (ref 96–112)
Creatinine, Ser: 1.82 mg/dL — ABNORMAL HIGH (ref 0.50–1.10)
GFR calc Af Amer: 36 mL/min — ABNORMAL LOW (ref >90)
GFR calc non Af Amer: 31 mL/min — ABNORMAL LOW (ref >90)
Glucose, Bld: 93 mg/dL (ref 70–99)
Potassium: 4.1 mEq/L (ref 3.5–5.1)
Sodium: 135 mEq/L (ref 135–145)

## 2012-08-16 LAB — URINALYSIS, ROUTINE W REFLEX MICROSCOPIC
Glucose, UA: NEGATIVE mg/dL
Hgb urine dipstick: NEGATIVE
Ketones, ur: NEGATIVE mg/dL
Nitrite: NEGATIVE
Protein, ur: NEGATIVE mg/dL
Specific Gravity, Urine: 1.012 (ref 1.005–1.030)
Urobilinogen, UA: 1 mg/dL (ref 0.0–1.0)
pH: 5 (ref 5.0–8.0)

## 2012-08-16 LAB — GLUCOSE, CAPILLARY
Glucose-Capillary: 68 mg/dL — ABNORMAL LOW (ref 70–99)
Glucose-Capillary: 77 mg/dL (ref 70–99)

## 2012-08-16 LAB — CREATININE, SERUM: GFR calc Af Amer: 40 mL/min — ABNORMAL LOW (ref >90)

## 2012-08-16 LAB — PRO B NATRIURETIC PEPTIDE: Pro B Natriuretic peptide (BNP): 7892 pg/mL — ABNORMAL HIGH (ref 0–125)

## 2012-08-16 MED ORDER — INSULIN ASPART 100 UNIT/ML ~~LOC~~ SOLN
0.0000 [IU] | SUBCUTANEOUS | Status: DC
  Administered 2012-08-17 (×2): 2 [IU] via SUBCUTANEOUS
  Administered 2012-08-18 (×2): 3 [IU] via SUBCUTANEOUS
  Administered 2012-08-18: 2 [IU] via SUBCUTANEOUS
  Administered 2012-08-19 (×3): 3 [IU] via SUBCUTANEOUS

## 2012-08-16 MED ORDER — ENOXAPARIN SODIUM 40 MG/0.4ML ~~LOC~~ SOLN
40.0000 mg | SUBCUTANEOUS | Status: DC
  Administered 2012-08-16: 40 mg via SUBCUTANEOUS
  Filled 2012-08-16 (×2): qty 0.4

## 2012-08-16 MED ORDER — ACETAMINOPHEN 325 MG PO TABS
650.0000 mg | ORAL_TABLET | ORAL | Status: DC | PRN
  Administered 2012-08-16 – 2012-08-21 (×10): 650 mg via ORAL
  Filled 2012-08-16: qty 1
  Filled 2012-08-16 (×7): qty 2
  Filled 2012-08-16: qty 1
  Filled 2012-08-16 (×2): qty 2

## 2012-08-16 MED ORDER — SODIUM CHLORIDE 0.9 % IV SOLN: 250.0000 mL | INTRAVENOUS | Status: DC | PRN

## 2012-08-16 MED ORDER — SODIUM CHLORIDE 0.9 % IJ SOLN
3.0000 mL | Freq: Two times a day (BID) | INTRAMUSCULAR | Status: DC
  Administered 2012-08-16 – 2012-08-23 (×14): 3 mL via INTRAVENOUS

## 2012-08-16 MED ORDER — GABAPENTIN 600 MG PO TABS
600.0000 mg | ORAL_TABLET | Freq: Three times a day (TID) | ORAL | Status: DC
  Administered 2012-08-17 – 2012-08-23 (×20): 600 mg via ORAL
  Filled 2012-08-16 (×21): qty 1

## 2012-08-16 MED ORDER — SODIUM CHLORIDE 0.9 % IJ SOLN: 3.0000 mL | INTRAMUSCULAR | Status: DC | PRN

## 2012-08-16 MED ORDER — LIDOCAINE 5 % EX OINT
TOPICAL_OINTMENT | Freq: Three times a day (TID) | CUTANEOUS | Status: DC | PRN
  Filled 2012-08-16 (×3): qty 35.44

## 2012-08-16 MED ORDER — KETOCONAZOLE 2 % EX CREA
TOPICAL_CREAM | Freq: Two times a day (BID) | CUTANEOUS | Status: DC | PRN
  Filled 2012-08-16: qty 15

## 2012-08-16 NOTE — ED Provider Notes (Signed)
CSN: [id number]     Arrival date & time 08/16/12  1225 History     First MD Initiated Contact with Patient 08/16/12 1230     Chief Complaint  Patient presents with  . Chest Pain   (Consider location/radiation/quality/duration/timing/severity/associated sxs/prior Treatment) HPI Pt is a 52yo female with hx of MR and diastolic heart failure and pulmonary HTN BIB ems with reports of pt stating she had chest pain that radiated under her left breast after eating breakfast this morning.  Per pt's son, pt checks her pulse every morning per pulmonologist request.  This morning it was in 150s, pulmonologist was called, advised pt to go to ER if elevated pulse, SOB, and chest pain.  Pt has also been more fatigued over the past few days. Son states she normally walks around the house by herself but has been sleeping more lately.  Denies new medications or change in dosage.  Denies fever, n/v/d.  CP resolve by time EMS arrived.    Past Medical History  Diagnosis Date  . OSA (obstructive sleep apnea)     CPAP  . Venous stasis ulcer     chornic, ?followed up at wound care center, multiple courses of antibiotics in past for cellulitis, on lasix  . GERD (gastroesophageal reflux disease)   . Anemia, iron deficiency     secondary to menhorrhagia, on oral iron, also b12 def, getting monthly b12 shots  . Chronic cough     secondary to alleriges and post nasal drip  . Depression   . Diabetes mellitus     well controlled on metformin  . Hyperlipidemia   . H/O mental retardation   . Herpes   . Cor pulmonale     PA Peak pressure 32mHg  . Diastolic heart failure   . Restrictive lung disease     PFTs 06/2012 (FVC 54% predicted and FEV1 68% predicted w minimal bronchodilator response).  . CHF (congestive heart failure)   . Renal disorder   . Pulmonary hypertension    Past Surgical History  Procedure Laterality Date  . Cholecystectomy     Family History  Problem Relation Age of Onset  . Mental  illness Sister   . Mental retardation Brother    History  Substance Use Topics  . Smoking status: Never Smoker   . Smokeless tobacco: Never Used  . Alcohol Use: No   OB History   Grav Para Term Preterm Abortions TAB SAB Ect Mult Living   _0 Review of Systems  Unable to perform ROS: Other    Allergies  Aspirin; Codeine; Lisinopril; and Sulfonamide derivatives  Home Medications   Current Outpatient Rx  Name  Route  Sig  Dispense  Refill  . benzonatate (TESSALON) 100 MG capsule   Oral   Take 100 mg by mouth 2 (two) times daily as needed for cough. Take 100 mg by mouth 3 (three) times daily as needed for Cough.         . Cyanocobalamin (RA VITAMIN B-12 TR) 1000 MCG TBCR   Oral   Take 1,000 mcg by mouth daily. Take 250 mcg by mouth daily.         . fluticasone (FLONASE) 50 MCG/ACT nasal spray   Nasal   Place 2 sprays into the nose daily. Place 2 sprays into both nostrils daily.         .Marland Kitchengabapentin (NEURONTIN) 600 MG tablet   Oral  Take 600 mg by mouth 3 (three) times daily. Take 1 tablet (600 mg total) by mouth 3 (three) times daily.         Marland Kitchen ketoconazole (NIZORAL) 2 % cream   Topical   Apply topically. Apply topically 2 (two) times daily.         Marland Kitchen lidocaine (XYLOCAINE) 5 % ointment   Topical   Apply topically. Apply topically 3 (three) times daily as needed.         . loratadine (CLARITIN) 10 MG tablet   Oral   Take 10 mg by mouth daily as needed for allergies. Take 10 mg by mouth daily.         . metFORMIN (GLUCOPHAGE) 1000 MG tablet   Oral   Take by mouth. Take 1,000 mg by mouth 2 (two) times daily with meals.         Marland Kitchen OVER THE COUNTER MEDICATION   Oral   Take 1 tablet by mouth daily as needed (diarrhea).         . pantoprazole (PROTONIX) 20 MG tablet   Oral   Take 20 mg by mouth 2 (two) times daily. Take 20 mg by mouth 2 (two) times daily.         . potassium chloride SA (K-DUR,KLOR-CON) 20 MEQ tablet   Oral    Take 20 mEq by mouth daily. Take 40 mEq by mouth daily.         Marland Kitchen albuterol (PROAIR HFA) 108 (90 BASE) MCG/ACT inhaler   Inhalation   Inhale 2 puffs into the lungs every 6 (six) hours as needed. Inhale 2 inhalations into the lungs every 6 (six) hours as needed for Wheezing.         . torsemide (DEMADEX) 100 MG tablet   Oral   Take 100 mg by mouth daily. Take 1 tablet (100 mg total) by mouth daily.          BP 98/54  Pulse 103  Temp(Src) 98.9 F (37.2 C) (Oral)  Resp 24  Ht _0  (1.6 m)  Wt 228 lb 8 oz (103.647 kg)  BMI 40.49 kg/m2  SpO2 93% Physical Exam  Nursing note and vitals reviewed. Constitutional: She is oriented to person, place, and time. She appears well-developed and well-nourished. No distress.  Elderly female appears to be sleeping in bed. Nasal cannula in place. Easily awoken but only for a few seconds before appearing to fall asleep again.   HENT:  Head: Normocephalic and atraumatic.  Eyes: Conjunctivae are normal. No scleral icterus.  Neck: Normal range of motion.  Cardiovascular: Normal rate, regular rhythm and normal heart sounds.   Pulmonary/Chest: Effort normal and breath sounds normal. No respiratory distress. She has no wheezes. She has no rales. She exhibits no tenderness.  Abdominal: Soft. Bowel sounds are normal. She exhibits no distension and no mass. There is no tenderness. There is no rebound and no guarding.  Musculoskeletal: Normal range of motion.  Neurological: She is alert and oriented to person, place, and time.  Skin: Skin is warm and dry. Rash noted. She is not diaphoretic. There is erythema.  Diffuse papular rash on right arm    ED Course   Procedures (including critical care time)  Labs Reviewed  CBC - Abnormal; Notable for the following:    Hemoglobin 10.3 (*)    HCT 31.5 (*)    MCV 76.1 (*)    MCH 24.9 (*)    RDW 19.8 (*)    All  other components within normal limits  BASIC METABOLIC PANEL  TROPONIN I  PRO B NATRIURETIC  PEPTIDE  URINALYSIS, ROUTINE W REFLEX MICROSCOPIC   Dg Chest Port 1 View  08/16/2012   *RADIOLOGY REPORT*  Clinical Data: Chest pain  PORTABLE CHEST - 1 VIEW  Comparison: 07/14/2012  Findings: Cardiomegaly again noted.  Mild interstitial prominence bilaterally.  Minimal interstitial edema cannot be excluded. Persistent left basilar atelectasis or infiltrate.  Right lung is clear.  There is a right subclavian catheter with tip in distal SVC.  No diagnostic pneumothorax.  IMPRESSION: Mild interstitial prominence bilaterally.  Minimal interstitial edema cannot be excluded.  Persistent left basilar atelectasis or infiltrate.  Right lung is clear.  There is a right subclavian catheter with tip in distal SVC.  No diagnostic pneumothorax.   Original Report Authenticated By: Lahoma Crocker, M.D.   No diagnosis found.  MDM  Pt with extensive cardiopulmonary and MR denies CP and SOB upon arrival.  Difficult to get hx from pt.  Will perform cardiac workup.  If negative, will attempt to consult pt's son again to come in and help determine pt's baseline.  Pt signed out to Dr. Christy Gentles at shift change.  Plan is to reassess pt to determine dispo.    Noland Fordyce, PA-C 08/16/12 1635

## 2012-08-16 NOTE — ED Provider Notes (Signed)
Pt stabilized in the ED However given h/o CP, appears to have some worsening CHF  will admit D/w internal medicine will admit   Sharyon Cable, MD 08/16/12 1849

## 2012-08-16 NOTE — Progress Notes (Addendum)
Pt arrived to floor in no apparent distress. Pt oriented to floor and room. MD on floor to see pt. Will continue to monitor.

## 2012-08-16 NOTE — H&P (Signed)
Date: 08/17/2012               Patient Name:  [name] MRN: [id number]  DOB: 05/16/1960 Age / Sex: 52 y.o., female   PCP: Luanne Bras, MD         Medical Service: Internal Medicine Teaching Service         Attending Physician: Dr. Carlyle Basques, MD    First Contact: Dr. Duwaine Maxin Pager: [phone]  Second Contact: Dr. Ivor Costa Pager: [phone]       After Hours (After 5p/  First Contact Pager: [phone]  weekends / holidays): Second Contact Pager: [phone]   Chief Complaint: Episode of chest pain, increased heart rate  History of Present Illness:  [name] is a 52 y.o. woman with cor pulmonale and severe pulmonary hypertension (PAP 95/49) on continuous Remodulin infusion, diastolic heart failure (EF 55% in 7/14), restrictive lung disease, OSA/OHS, chronic venous stasis, morbid obesity, mild mental retardation who presents today with an episode of chest pain and tachycardia.  [name] was most recently addmitted to [facility] on 07/12/12 with hypotension, hypoxia, and AKI. She was transferred to the ICU and and found to have severe pulmonary hypertension and cardiogenic shock. She was treated with dopamine and then transferred to [facility] where she was treated with Remodulin (a prostacyclin synthetic analog). Patient was discharged from [facility] on 7/31 on continuous Remodulin infusion (36 ng/kg/min 40 mL/24hrs with 15 mg/181m concentration) via Hickman catheter. Sleep-disordered breathing and diastolic heart failure were thought to be the drivers of her cor pulmonale, so she was instructed to be compliant with her CPAP and placed on torsemide 1057mdaily. Her pulmonary hypertension is being managed by Dr. [facility]illes [facility]  She presents today after an episode of chest pain this morning. She describes it as a sharp pain radiating underneath her left breast that began after eating breakfast. The pain was worse with movement. She initially thought it was indigestion;  however, her son took her pulse and found it to be 150bpm. They called her pulmonologist, who advised her to go to the ER. The pain resolved by the time they arrived. Denies worsening shortness of breath; she is on O2 4L [place] at home, and is able to walk around the house using her walker with mild dyspnea, which is her baseline. Denies weight changes, increased swelling in her legs or abdomen. She sleeps on 3 pillows (one is a sofa cushion) at baseline. Denies increasing orthopnea or PND. She reports compliance with her Remodulin. The cassette is changed every other day which patient is able to do with assistance from her son. She states she has been compliant with her CPAP at night. She is on a 150041mluid restricted diet, but notes she was ~500m51mer this value the last several days. She has been complaint with salt restriction.  On review of systems, she also complains of left elbow pain for several days, localized to the posterior surface, exacerbated by movement, with no history of trauma. She also notes a diffuse papular rash, non pruritic, which [facility] and told it was a known side effect of her Remodulin. She states she has a right pretibial ulcer and an ulcer on her buttocks, neither of which are painful. She was [facility] by [facility] has a home health nurse assisting her with them  now. Denies fevers or chills.   Meds: Current Facility-Administered Medications  Medication Dose Route Frequency Provider Last Rate Last Dose  . 0.9 %  sodium chloride infusion  250 mL Intravenous PRN Otho Bellows, MD      . acetaminophen (TYLENOL) tablet 650 mg  650 mg Oral Q4H PRN Otho Bellows, MD   650 mg at 08/16/12 2332  . [START ON 08/18/2012] antiseptic oral rinse (BIOTENE) solution 15 mL  15 mL Mouth Rinse q12n4p Carlyle Basques, MD      . Derrill Memo ON 08/18/2012] chlorhexidine (PERIDEX) 0.12 %  solution 15 mL  15 mL Mouth Rinse BID Carlyle Basques, MD      . Chlorhexidine Gluconate Cloth 2 % PADS 6 each  6 each Topical Q0600 Carlyle Basques, MD      . enoxaparin (LOVENOX) injection 40 mg  40 mg Subcutaneous Q24H Otho Bellows, MD   40 mg at 08/16/12 2305  . gabapentin (NEURONTIN) tablet 600 mg  600 mg Oral TID Otho Bellows, MD      . insulin aspart (novoLOG) injection 0-15 Units  0-15 Units Subcutaneous Q4H Otho Bellows, MD      . ketoconazole (NIZORAL) 2 % cream   Topical BID PRN Otho Bellows, MD      . lidocaine (XYLOCAINE) 5 % ointment   Topical TID PRN Otho Bellows, MD      . mupirocin ointment (BACTROBAN) 2 % 1 application  1 application Nasal BID Carlyle Basques, MD      . sodium chloride 0.9 % injection 3 mL  3 mL Intravenous Q12H Otho Bellows, MD   3 mL at 08/16/12 2306  . sodium chloride 0.9 % injection 3 mL  3 mL Intravenous PRN Otho Bellows, MD        Allergies: Allergies as of 08/16/2012 - Review Complete 08/16/2012  Allergen Reaction Noted  . Aspirin  12/09/2005  . Codeine    . Lisinopril  05/02/2012  . Sulfonamide derivatives  10/19/2005   Past Medical History  Diagnosis Date  . OSA (obstructive sleep apnea)     CPAP  . Venous stasis ulcer     chornic, ?followed up at wound care center, multiple courses of antibiotics in past for cellulitis, on lasix  . GERD (gastroesophageal reflux disease)   . Anemia, iron deficiency     secondary to menhorrhagia, on oral iron, also b12 def, getting monthly b12 shots  . Chronic cough     secondary to alleriges and post nasal drip  . Depression   . Diabetes mellitus     well controlled on metformin  . Hyperlipidemia   . H/O mental retardation   . Herpes   . Cor pulmonale     PA Peak pressure 83mHg  . Diastolic heart failure   . Restrictive lung disease     PFTs 06/2012 (FVC 54% predicted and FEV1 68% predicted w minimal bronchodilator response).  . CHF (congestive heart failure)   . Renal disorder    . Pulmonary hypertension   . Hypertension   . Shortness of breath   . COPD (chronic obstructive pulmonary disease)    Past Surgical History  Procedure Laterality Date  . Cholecystectomy     Family History  Problem Relation Age of Onset  . Mental illness Sister   . Mental retardation Brother    History   Social History  . Marital Status: Single    Spouse Name: N/A  Number of Children: N/A  . Years of Education: N/A   Occupational History  . Not on file.   Social History Main Topics  . Smoking status: Never Smoker   . Smokeless tobacco: Never Used  . Alcohol Use: No  . Drug Use: No  . Sexual Activity: No   Other Topics Concern  . Not on file   Social History Narrative  . No narrative on file    Review of Systems: Pertinent items are noted in HPI. 10 point ROS was performed.  Physical Exam: Blood pressure 92/62, pulse 99, temperature 98.3 F (36.8 C), temperature source Oral, resp. rate 18, height 5' (1.524 m), weight 231 lb 3.2 oz (104.872 kg), SpO2 93.00%. on 4L [place].   Physical Exam  Constitutional: She is oriented to person, place, and time and well-developed, well-nourished, and in no distress.  HENT:  Head: Normocephalic and atraumatic.  Eyes: Conjunctivae and EOM are normal. Pupils are equal, round, and reactive to light.  Neck: Normal range of motion. Neck supple. JVD (to angle of mandible, but exam limited by body habitus) present.  Cardiovascular: Normal rate, regular rhythm, normal heart sounds and intact distal pulses.  Exam reveals no gallop and no friction rub.   No murmur heard. No pitting edema noted in LE. Erythematous skin changes noted at ankles bilaterally.  Pulmonary/Chest: Effort normal. No respiratory distress. She has no wheezes. She has rales (Soft crackles at the lung bases BL, L>R).  Hickman catheter in place.   Abdominal: Soft. Bowel sounds are normal. She exhibits distension (mildly). There is no tenderness.  Musculoskeletal: She  exhibits no tenderness (no calf tendereness).       Left elbow: She exhibits decreased range of motion (ROM limited by pain). She exhibits no swelling, no effusion and no deformity.  Chest wall: Some reproduction of chest pain with palpation over left 5th/6th ribs in the midclavicular area.   Neurological: She is alert and oriented to person, place, and time. No cranial nerve deficit. GCS score is 15.  Skin: Skin is warm and dry. Rash (diffuse macular rash) noted. She is not diaphoretic. There is erythema (at ankles bilaterally).  Right pretibial ulcer, stage 2, with chronic erythematous skin changes and mild warmth, but area is clean, dry, intact with no purulence and no pain. Stage 1 sacral ulcer present. Right gluteal cleft ulcer also present with eschar, unstageable, no surrounding erythema, no purulence.     Lab results: Basic Metabolic Panel:  Recent Labs  08/16/12 1544 08/16/12 2211  NA 135  --   K 4.1  --   CL 96  --   CO2 28  --   GLUCOSE 93  --   BUN 55*  --   CREATININE 1.82* 1.66*  CALCIUM 8.6  --    CBC:  Recent Labs  08/16/12 1544 08/16/12 2211  WBC 9.7 9.7  HGB 10.3* 10.6*  HCT 31.5* 32.6*  MCV 76.1* 76.3*  PLT 273 284   Cardiac Enzymes:  Recent Labs  08/16/12 1544 08/16/12 2211  TROPONINI <0.30 <0.30   BNP:  Recent Labs  08/16/12 1417  PROBNP 7892.0*   CBG:  Recent Labs  08/16/12 2318 08/16/12 2355 08/17/12 0405  GLUCAP 68* 77 95   Urinalysis:  Recent Labs  08/16/12 1612  COLORURINE YELLOW  LABSPEC 1.012  PHURINE 5.0  GLUCOSEU NEGATIVE  HGBUR NEGATIVE  BILIRUBINUR SMALL*  KETONESUR NEGATIVE  PROTEINUR NEGATIVE  UROBILINOGEN 1.0  NITRITE NEGATIVE  LEUKOCYTESUR TRACE*  Imaging results:  Dg Elbow Complete Left  08/17/2012   *RADIOLOGY REPORT*  Clinical Data: Diffuse elbow pain, no trauma  LEFT ELBOW - COMPLETE 3+ VIEW  Comparison: Report of prior similar exam 06/22/2002.  Images are not digitized.  Findings: Normal  variant lucency of the radial tuberosity is noted. There is mild prominence of the anterior fat pad which may indicate a small joint effusion.  No fracture or dislocation is identified.  IMPRESSION: No fracture or dislocation identified.  A small joint effusion may be present which could indicate occult fracture but is nonspecific. Consider follow-up imaging if symptoms persist or bone scan if further evaluation is desired.   Original Report Authenticated By: Conchita Paris, M.D.   Dg Chest Port 1 View  08/16/2012   *RADIOLOGY REPORT*  Clinical Data: Chest pain  PORTABLE CHEST - 1 VIEW  Comparison: 07/14/2012  Findings: Cardiomegaly again noted.  Mild interstitial prominence bilaterally.  Minimal interstitial edema cannot be excluded. Persistent left basilar atelectasis or infiltrate.  Right lung is clear.  There is a right subclavian catheter with tip in distal SVC.  No diagnostic pneumothorax.  IMPRESSION: Mild interstitial prominence bilaterally.  Minimal interstitial edema cannot be excluded.  Persistent left basilar atelectasis or infiltrate.  Right lung is clear.  There is a right subclavian catheter with tip in distal SVC.  No diagnostic pneumothorax.   Original Report Authenticated By: Lahoma Crocker, M.D.    Other results: EKG: _0 , taken during an episode of tachycardia: Study shows right axis deviation and RVH, 1st degree AV block. The machine reads atrial flutter with 2:1 block, but I think it's more likely sinus tachycarida. The lead II rhythm strip has some suggestion of inverted flutter-type waves, but the rate is 139 (usually ~150 with 2:1 atrial flutter), and most importantly I do not appreciate an atrial rate of 300bpm. Also other inferior leads do not show loss of isoelectric baseline (e.g. lead III). Prior EKG from 07/12/12 shows NSR, rate 84, right axis deviation, 1st degree AV block. She may have had interval worsening of her RVH as there were no S-waves in V4, V5 in the prior study, but  these are present today.  Assessment & Plan by Problem: FaithEdyth A Barker is a 52 y.o. woman with cor pulmonale with severe pulmonary hypertension (PAP 95/49) on continuous Remodulin infusion, diastolic heart failure (EF 55% in 7/14), restrictive lung disease, OSA/OHS, chronic venous stasis, morbid obesity, mild mental retardation who presents today after an isolated episode of chest pain and tachycardia. We discussed this patient with Dr. Alva Garnet of PCCM given her significant and complicated cardiopulmonary issues.  #Pulmonary hypertension vs. CHF exacerbation - ProBNP elevated to 7000 (baseline ~3000), suggestion of pulmonary edema seen on CXR, some JVD + crackles heard at the lung bases on physical exam, concerning for CHF exacerbation. However patient denies worsening SOB, worsening edema in legs or abdomen, orthopnea, PND. Pulmonary embolism is on the differential, but symptoms and vital sign abnormalities not persistent, no unilateral calf pain or leg swelling. Also we would expect a more dramatic presentation for PE in a patient with such tenuous right heart function. Her Wells criteria=+1.5, low risk group for PE, 1.3-3% chance of PE. ?Pneumonia with persistent left basilar atelectasis vs. infiltrate on CXR, however no WBC, no fever, no cough or sputum production. Her heart failure is followed by Dr. Jeffie Pollock here. Her pulmonary hypertension is followed by Dr. Gilles Chiquito at [facility]. - Admit to IMTS, telemetry - Will hold off on diuresis given  hypotension, volume dependent state, also recommended by PCCM  - Echocardiogram - Cardiology notified of patient - q4h vitals - Daily weights, strict Is/Os - Repeat CXR in am - Continue Remodulin infusion - pharmacy on board - Plan to call [facility] in the am to notify them of her presence and determine if they have any recommendations, or if they feel the patient should be transferred back to [facility] for further management. Dr. Sharion Balloon office # [phone],  [phone].  #Chest pain - Resolved. Suspect musculoskeletal/costochondritis given exacerbation with movement when it was present. Pain was reproducible on exam with palpation over left 5th/6th ribs in the midclavicular area. Still, in this patient with significant cardiac history it is important to at least rule out acute MI. - Cycling troponins, negative x2 - Repeat EKG in am  #Tachycardia - Paroxysmal episodes of tachycardia; she had one episode to ~150bpm at home, and one episode to 139 here on the floor, both of which resolved without intervention. Her heart rate is 90s-100s at baseline. Unclear etiology. ?Atrial flutter on EKG taken during the episode here, but for reasons stated above rhythm is more likely sinus tachycardia. That being said, patients with PAH and right heart failure are at high risk of developing atrial fib/flutter, and these arrhythmias can cause acute decompensation. She is currently not being anticoagulated, but this will need to be addressed further by the primary team.  - Telemetry bed with frequent vital sign checks - Repeat EKG in am  #Hypotension - 80-90s/40-60s on admission. This appears to be around her baseline per chart biopsy. Remodulin is a vasodilator and is known to cause hypotension. - Telemetry bed with vital signs q4h - Holding diuretics  #OSA - Sleep-disordered breathing thought to be a driver of her cor pulmonale. On CPAP at home and reports compliance. - Continue CPAP qhs  #Diabetes - Stable. Last A1C 6.0% in 5/14. CBG currently 77. - Moderate SSI - Continue gabapentin for peripheral neuropathy - Holding metformin  #CKD, stage 3 - Baseline Cr seems to be around 1.0-1.2 with GFR 60-70, but with a significantly elevated Cr up to 4.69 during her last hospitalization thought 2/2 volume loss from diarrhea. This admission, Cr 1.82, which is up from 1.44 on 8/7. Will continue to monitor. - Holding diuretics and metformin on admission. - AM BMP  #Skin  ulcers - At right pretibial area (stage 2), sacrum (stage 1), right gluteal cleft (with eschar). Concern low for cellulitis given lack of pain, no erythema, wound without purulence, no history of fevers or chills.  - Wound care consult in the am  #Left elbow pain - ROM limited by pain, no palpable effusion, pain localized to posterior elbow/ olecranon bursa. ?Olecranon bursitis, but no swelling. Xray shows "mild prominence of the anterior fat pad which may indicate a small joint effusion"; however patient's pain is in the posterior portion of her elbow. Occult fracture unlikely. - Tylenol prn pain  #Rash - Thought to be a side effect of her Remodulin. Not particularly bothersome to patient. - Continue home ketoconazole cream as needed.  #DVT PPX - Lovenox subq   Dispo: Disposition is deferred at this time, awaiting improvement of current medical problems. Anticipated discharge in approximately 1-3 day(s).   The patient does have a current PCP Luanne Bras, MD) and does need an [facility] hospital follow-up appointment after discharge.  The patient does not have transportation limitations that hinder transportation to clinic appointments.  Signed: Lesly Dukes, MD 08/17/2012, 6:24 AM

## 2012-08-16 NOTE — Progress Notes (Signed)
Hypoglycemic Event  CBG: 68  Treatment: 15 GM carbohydrate snack  Symptoms: Hungry  Follow-up CBG: Time:2353 CBG Result:77  Possible Reasons for Event: Inadequate meal intake  Comments/MD notified:MD notified. Pt given 2 OJ and graham cracker and peanut butter snack. Pt CBG re-checked 20 minutes after drinking OJ. Will continue to monitor and assess.     Elyn Aquas A  Remember to initiate Hypoglycemia Order Set & complete

## 2012-08-16 NOTE — Progress Notes (Signed)
Pt HR in 140s, EKG being performed while HR elevated, shows A Flutter. Pt asymptomatic and resting in bed. Last BP 98/60 manual. MD notified, pt converted to SR in 90s while on phone with MD. Pt also with pump in room attached to port in chest running Remodulin. MD notified and pharmacy notified. Will continue to monitor. Shelby Mattocks, RN

## 2012-08-16 NOTE — ED Notes (Addendum)
Pt reports After eating breakfast today Pt reported CP started The pain radiated under Lt breast . The pain resolved by the time EMS arrived. Pain per EMS 5/10  Pain. Vitals per EMS 104/53. Pulse 102 . Pt reported HR was 150 . EMS CBG 120

## 2012-08-17 ENCOUNTER — Inpatient Hospital Stay (HOSPITAL_COMMUNITY): Payer: Medicaid Other

## 2012-08-17 DIAGNOSIS — I369 Nonrheumatic tricuspid valve disorder, unspecified: Secondary | ICD-10-CM

## 2012-08-17 DIAGNOSIS — I4892 Unspecified atrial flutter: Principal | ICD-10-CM

## 2012-08-17 DIAGNOSIS — R079 Chest pain, unspecified: Secondary | ICD-10-CM

## 2012-08-17 DIAGNOSIS — I5032 Chronic diastolic (congestive) heart failure: Secondary | ICD-10-CM

## 2012-08-17 LAB — BASIC METABOLIC PANEL
Chloride: 96 mEq/L (ref 96–112)
Creatinine, Ser: 1.55 mg/dL — ABNORMAL HIGH (ref 0.50–1.10)
GFR calc Af Amer: 44 mL/min — ABNORMAL LOW (ref >90)
Potassium: 3.5 mEq/L (ref 3.5–5.1)

## 2012-08-17 LAB — GLUCOSE, CAPILLARY
Glucose-Capillary: 95 mg/dL (ref 70–99)
Glucose-Capillary: 123 mg/dL — ABNORMAL HIGH (ref 70–99)

## 2012-08-17 LAB — MRSA PCR SCREENING: MRSA by PCR: POSITIVE — AB

## 2012-08-17 LAB — TROPONIN I
Troponin I: <0.3 ng/mL (ref <0.30)
Troponin I: <0.3 ng/mL (ref <0.30)

## 2012-08-17 MED ORDER — COLLAGENASE 250 UNIT/GM EX OINT
TOPICAL_OINTMENT | Freq: Every day | CUTANEOUS | Status: DC
  Administered 2012-08-17 – 2012-08-22 (×6): via TOPICAL
  Administered 2012-08-23: 1 via TOPICAL
  Filled 2012-08-17: qty 30

## 2012-08-17 MED ORDER — NONFORMULARY OR COMPOUNDED ITEM: 1.7000 mL/h | Status: DC

## 2012-08-17 MED ORDER — CHLORHEXIDINE GLUCONATE 0.12 % MT SOLN
15.0000 mL | Freq: Two times a day (BID) | OROMUCOSAL | Status: DC
  Administered 2012-08-18 – 2012-08-21 (×7): 15 mL via OROMUCOSAL
  Filled 2012-08-17 (×9): qty 15

## 2012-08-17 MED ORDER — RIVAROXABAN 20 MG PO TABS
20.0000 mg | ORAL_TABLET | Freq: Every day | ORAL | Status: DC
  Administered 2012-08-17 – 2012-08-23 (×7): 20 mg via ORAL
  Filled 2012-08-17 (×8): qty 1

## 2012-08-17 MED ORDER — TREPROSTINIL SODIUM 1 MG/ML IJ SOLN
36.0000 ng/kg/min | INTRAMUSCULAR | Status: DC
  Administered 2012-08-21: 36 ng/kg/min via INTRAVENOUS

## 2012-08-17 MED ORDER — NONFORMULARY OR COMPOUNDED ITEM
36.0000 ng/kg/min | Status: DC
  Administered 2012-08-17: 36 ng/kg/min via INTRAVENOUS

## 2012-08-17 MED ORDER — NONFORMULARY OR COMPOUNDED ITEM: 36.0000 ng/kg/min | Status: DC

## 2012-08-17 MED ORDER — MUPIROCIN 2 % EX OINT
1.0000 "application " | TOPICAL_OINTMENT | Freq: Two times a day (BID) | CUTANEOUS | Status: AC
  Administered 2012-08-17 – 2012-08-21 (×10): 1 via NASAL
  Filled 2012-08-17: qty 22

## 2012-08-17 MED ORDER — FUROSEMIDE 10 MG/ML IJ SOLN
80.0000 mg | Freq: Once | INTRAMUSCULAR | Status: AC
  Administered 2012-08-17: 80 mg via INTRAVENOUS
  Filled 2012-08-17: qty 8

## 2012-08-17 MED ORDER — BIOTENE DRY MOUTH MT LIQD
15.0000 mL | Freq: Two times a day (BID) | OROMUCOSAL | Status: DC
  Administered 2012-08-18 – 2012-08-23 (×10): 15 mL via OROMUCOSAL

## 2012-08-17 MED ORDER — CHLORHEXIDINE GLUCONATE CLOTH 2 % EX PADS
6.0000 | MEDICATED_PAD | Freq: Every day | CUTANEOUS | Status: AC
  Administered 2012-08-17 – 2012-08-20 (×3): 6 via TOPICAL

## 2012-08-17 MED ORDER — TORSEMIDE 100 MG PO TABS
100.0000 mg | ORAL_TABLET | Freq: Every day | ORAL | Status: DC
  Administered 2012-08-18 – 2012-08-23 (×6): 100 mg via ORAL
  Filled 2012-08-17 (×6): qty 1

## 2012-08-17 NOTE — Consult Note (Signed)
Advanced Heart Failure Team Consult Note  Referring Physician: FPTS  Reason for Consultation: PAH  HPI:    [name] is a 52 y.o. woman with cognitive impairment, OSA, obesity, diastolic HF and severe PAH c/b by cor pulmonale.  She was recently addmitted to [facility] in July with respiratory failure and RHC showed severe PAH with pulmonary pressures in the 90s. She was transferred to [facility] where she was started on Remodulin with excellent results.   She was admitted last night due to CP and tachycardia. She said she woke up about 7a and took her BP and noticed her pulse was 156 bpm. This was associated with mild CP under her left breast which she felt was indigestion, The CP resolved but when she checked her BP again at 11am it was still fast so she came to ER. Initial ECG showed atypical flutter vs atrial tach. She then converted spontaneously. She now feels fine.  She denies worsening shortness of breath; she is on O2 4L [place] at home, and is able to walk around the house using her walker with mild dyspnea, which is improving. Denies weight changes, increased swelling in her legs or abdomen. She has developed a rash from the Remodulin.  Echo today with EF 60% RV moderately dilated/HK. RVSP 100mHG   Review of Systems: [y] = yes, _0  = no   General: Weight gain _1 ; Weight loss _2 ; Anorexia _3 ; Fatigue [ y]; Fever _4 ; Chills _5 ; Weakness _6   Cardiac: Chest pain/pressure [Blue.Reese]; Resting SOB _7 ; Exertional SOB [Blue.Reese]; Orthopnea _8 ; Pedal Edema [Blue.Reese]; Palpitations _9 ; Syncope _10 ; Presyncope _11 ; Paroxysmal nocturnal dyspnea_12   Pulmonary: Cough _13 ; Wheezing_14 ; Hemoptysis_15 ; Sputum _16 ; Snoring _17   GI: Vomiting_18 ; Dysphagia_19 ; Melena_20 ; Hematochezia _21 ; Heartburn_22 ; Abdominal pain _23 ; Constipation _24 ; Diarrhea _25 ; BRBPR _26   GU: Hematuria_27 ; Dysuria _28 ; Nocturia_29   Vascular: Pain in legs with walking _30 ; Pain in feet with lying flat _31 ; Non-healing sores _32 ; Stroke _33 ; TIA _34 ;  Slurred speech _35 ;  Neuro: Headaches_36 ; Vertigo_37 ; Seizures_38 ; Paresthesias_39 ;Blurred vision _40 ; Diplopia _41 ; Vision changes _42   Ortho/Skin: Arthritis _43 ; Joint pain [Blue.Reese]; Muscle pain _44 ; Joint swelling _45 ; Back Pain _46 ; Rash _47   Psych: Depression_48 ; Anxiety_49   Heme: Bleeding problems _50 ; Clotting disorders _51 ; Anemia _52   Endocrine: Diabetes _53 ; Thyroid dysfunction_54   Home Medications Prior to Admission medications   Medication Sig Start Date End Date Taking? Authorizing Provider  benzonatate (TESSALON) 100 MG capsule Take 100 mg by mouth 2 (two) times daily as needed for cough. Take 100 mg by mouth 3 (three) times daily as needed for Cough.   Yes Historical Provider, MD  Cyanocobalamin (RA VITAMIN B-12 TR) 1000 MCG TBCR Take 1,000 mcg by mouth daily. Take 250 mcg by mouth daily.   Yes Historical Provider, MD  fluticasone (FLONASE) 50 MCG/ACT nasal spray Place 2 sprays into the nose daily. Place 2 sprays into both nostrils daily.   Yes Historical Provider, MD  gabapentin (NEURONTIN) 600 MG tablet Take 600 mg by mouth 3 (three) times daily. Take 1 tablet (600 mg total) by mouth 3 (three) times daily. 08/03/12 08/03/13 Yes Historical Provider, MD  ketoconazole (NIZORAL) 2 % cream Apply topically. Apply topically 2 (  two) times daily. 08/03/12 08/03/13 Yes Historical Provider, MD  lidocaine (XYLOCAINE) 5 % ointment Apply topically. Apply topically 3 (three) times daily as needed. 08/03/12  Yes Historical Provider, MD  loratadine (CLARITIN) 10 MG tablet Take 10 mg by mouth daily as needed for allergies. Take 10 mg by mouth daily.   Yes Historical Provider, MD  metFORMIN (GLUCOPHAGE) 1000 MG tablet Take by mouth. Take 1,000 mg by mouth 2 (two) times daily with meals.   Yes Historical Provider, MD  OVER THE COUNTER MEDICATION Take 1 tablet by mouth daily as needed (diarrhea).   Yes Historical Provider, MD  pantoprazole (PROTONIX) 20 MG tablet Take 20 mg by mouth 2 (two) times daily. Take  20 mg by mouth 2 (two) times daily.   Yes Historical Provider, MD  potassium chloride SA (K-DUR,KLOR-CON) 20 MEQ tablet Take 20 mEq by mouth daily. Take 40 mEq by mouth daily.   Yes Historical Provider, MD  albuterol (PROAIR HFA) 108 (90 BASE) MCG/ACT inhaler Inhale 2 puffs into the lungs every 6 (six) hours as needed. Inhale 2 inhalations into the lungs every 6 (six) hours as needed for Wheezing.    Historical Provider, MD  torsemide (DEMADEX) 100 MG tablet Take 100 mg by mouth daily. Take 1 tablet (100 mg total) by mouth daily. 08/03/12 08/03/13  Historical Provider, MD    Past Medical History: Past Medical History  Diagnosis Date  . OSA (obstructive sleep apnea)     CPAP  . Venous stasis ulcer     chornic, ?followed up at wound care center, multiple courses of antibiotics in past for cellulitis, on lasix  . GERD (gastroesophageal reflux disease)   . Anemia, iron deficiency     secondary to menhorrhagia, on oral iron, also b12 def, getting monthly b12 shots  . Chronic cough     secondary to alleriges and post nasal drip  . Depression   . Diabetes mellitus     well controlled on metformin  . Hyperlipidemia   . H/O mental retardation   . Herpes   . Cor pulmonale     PA Peak pressure 16mHg  . Diastolic heart failure   . Restrictive lung disease     PFTs 06/2012 (FVC 54% predicted and FEV1 68% predicted w minimal bronchodilator response).  . CHF (congestive heart failure)   . Renal disorder   . Pulmonary hypertension   . Hypertension   . Shortness of breath   . COPD (chronic obstructive pulmonary disease)     Past Surgical History: Past Surgical History  Procedure Laterality Date  . Cholecystectomy      Family History: Family History  Problem Relation Age of Onset  . Mental illness Sister   . Mental retardation Brother     Social History: History   Social History  . Marital Status: Single    Spouse Name: N/A    Number of Children: N/A  . Years of Education: N/A    Social History Main Topics  . Smoking status: Never Smoker   . Smokeless tobacco: Never Used  . Alcohol Use: No  . Drug Use: No  . Sexual Activity: No   Other Topics Concern  . None   Social History Narrative  . None    Allergies:  Allergies  Allergen Reactions  . Aspirin     REACTION: airway swelling  . Codeine     REACTION: tingling in lips and hard breathing - had reaction at dentist - states "I can't take certain  kinds of codeine" - happened maybe 10 yr ago  . Lisinopril     Cough  . Sulfonamide Derivatives     REACTION: airway swelling    Objective:    Vital Signs:   Temp:  [97.2 F (36.2 C)-98.3 F (36.8 C)] 97.2 F (36.2 C) (08/14 1946) Pulse Rate:  [76-102] 99 (08/14 1946) Resp:  [18-20] 18 (08/14 1946) BP: (83-131)/(48-89) 83/50 mmHg (08/14 1946) SpO2:  [90 %-96 %] 90 % (08/14 1946) Weight:  [104.872 kg (231 lb 3.2 oz)] 104.872 kg (231 lb 3.2 oz) (08/14 0407) Last BM Date: 08/16/12  Weight change: Filed Weights   08/16/12 1244 08/16/12 2025 08/17/12 0407  Weight: 103.647 kg (228 lb 8 oz) 103.329 kg (227 lb 12.8 oz) 104.872 kg (231 lb 3.2 oz)    Intake/Output:   Intake/Output Summary (Last 24 hours) at 08/17/12 2324 Last data filed at 08/17/12 2300  Gross per 24 hour  Intake   1080 ml  Output   2854 ml  Net  -1774 ml     Physical Exam: General:  Chronicall ill appearing. NAD HEENT: normal Neck: supple. JVP 8 . Carotids 2+ bilat; no bruits. No lymphadenopathy or thryomegaly appreciated. Cor: PMI nondisplaced. Regular rate & rhythm. +RV lift. 2/6 TR. Loud P2 Lungs: clear Abdomen: obese soft, nontender, mildly distended. No hepatosplenomegaly. No bruits or masses. Good bowel sounds. Extremities: no cyanosis, clubbing, rash, 1-2+edema red splotchy rash on forearms and calves. + LE ulcer Neuro: alert & orientedx3, cranial nerves grossly intact. moves all 4 extremities w/o difficulty. Affect pleasant  Telemetry: SR  Labs: Basic Metabolic  Panel:  Recent Labs Lab 08/16/12 1544 08/16/12 2211 08/17/12 0440  NA 135  --  137  K 4.1  --  3.5  CL 96  --  96  CO2 28  --  28  GLUCOSE 93  --  91  BUN 55*  --  51*  CREATININE 1.82* 1.66* 1.55*  CALCIUM 8.6  --  8.5    Liver Function Tests: No results found for this basename: AST, ALT, ALKPHOS, BILITOT, PROT, ALBUMIN,  in the last 168 hours No results found for this basename: LIPASE, AMYLASE,  in the last 168 hours No results found for this basename: AMMONIA,  in the last 168 hours  CBC:  Recent Labs Lab 08/16/12 1544 08/16/12 2211  WBC 9.7 9.7  HGB 10.3* 10.6*  HCT 31.5* 32.6*  MCV 76.1* 76.3*  PLT 273 284    Cardiac Enzymes:  Recent Labs Lab 08/16/12 1544 08/16/12 2211 08/17/12 0440 08/17/12 1031  TROPONINI <0.30 <0.30 <0.30 <0.30    BNP: BNP (last 3 results)  Recent Labs  07/12/12 1645 08/06/12 1328 08/16/12 1417  PROBNP 22696.0* 3626.0* 7892.0*    CBG:  Recent Labs Lab 08/16/12 2355 08/17/12 0405 08/17/12 0753 08/17/12 1157 08/17/12 1617  GLUCAP 77 95 93 139* 123*    Coagulation Studies: No results found for this basename: LABPROT, INR,  in the last 72 hours  Other results: EKG: Atypical AFL vs atrial tach 156. +RVH  Imaging: Dg Elbow Complete Left  08/17/2012   *RADIOLOGY REPORT*  Clinical Data: Diffuse elbow pain, no trauma  LEFT ELBOW - COMPLETE 3+ VIEW  Comparison: Report of prior similar exam 06/22/2002.  Images are not digitized.  Findings: Normal variant lucency of the radial tuberosity is noted. There is mild prominence of the anterior fat pad which may indicate a small joint effusion.  No fracture or dislocation is identified.  IMPRESSION: No  fracture or dislocation identified.  A small joint effusion may be present which could indicate occult fracture but is nonspecific. Consider follow-up imaging if symptoms persist or bone scan if further evaluation is desired.   Original Report Authenticated By: Conchita Paris, M.D.    Dg Chest Port 1 View  08/17/2012   *RADIOLOGY REPORT*  Clinical Data: Short of breath  PORTABLE CHEST - 1 VIEW  Comparison: Yesterday  Findings: Right subclavian central venous catheter is stable. Cardiomegaly is stable.  Lungs are under aerated and grossly clear. No pneumothorax.  IMPRESSION: No active cardiopulmonary disease.   Original Report Authenticated By: Marybelle Killings, M.D.   Dg Chest Port 1 View  08/16/2012   *RADIOLOGY REPORT*  Clinical Data: Chest pain  PORTABLE CHEST - 1 VIEW  Comparison: 07/14/2012  Findings: Cardiomegaly again noted.  Mild interstitial prominence bilaterally.  Minimal interstitial edema cannot be excluded. Persistent left basilar atelectasis or infiltrate.  Right lung is clear.  There is a right subclavian catheter with tip in distal SVC.  No diagnostic pneumothorax.  IMPRESSION: Mild interstitial prominence bilaterally.  Minimal interstitial edema cannot be excluded.  Persistent left basilar atelectasis or infiltrate.  Right lung is clear.  There is a right subclavian catheter with tip in distal SVC.  No diagnostic pneumothorax.   Original Report Authenticated By: Lahoma Crocker, M.D.      Medications:     Current Medications: . [START ON 08/18/2012] antiseptic oral rinse  15 mL Mouth Rinse q12n4p  . [START ON 08/18/2012] chlorhexidine  15 mL Mouth Rinse BID  . Chlorhexidine Gluconate Cloth  6 each Topical Q0600  . collagenase   Topical Daily  . gabapentin  600 mg Oral TID  . insulin aspart  0-15 Units Subcutaneous Q4H  . mupirocin ointment  1 application Nasal BID  . rivaroxaban  20 mg Oral Q supper  . sodium chloride  3 mL Intravenous Q12H  . [START ON 08/18/2012] torsemide  100 mg Oral Daily     Infusions: . treprostinil (REMODULIN) infusion        Assessment:   1. Chest pain 2. AFL with RVR now in sinus rhythm 3. Severe PAH with cor pulmonale 4. Chronic diastolic HF 5. Cognitive impairment 6. Rash  Plan/Discussion:    On arrival, [name] had  AFL with RVR which prompted her to seek medical care. AFL is now resolved spontaneously. Her pulmonary pressures are much improved with IV treprostinil. Rash is a common side effect of the medication (not allergy). Currently volume overloaded.   Recs:  1) Continue treprostinil  2) IV lasix tonight. Resume demadex tomorrow.  3) Start Xarelto for AFL  4) If AFL recurs -> amiodarone.  5) Likely can be d/c'd home soon.    We will follow.   Length of Stay: 1  Glori Bickers 08/17/2012, 11:24 PM  Advanced Heart Failure Team Pager [phone] (M-F; 7a - 4p)  Please contact [facility] for night-coverage after hours (4p -7a ) and weekends on [email or web address]

## 2012-08-17 NOTE — Consult Note (Addendum)
WOC consult Note Reason for Consult: evaluation of LE wound and sacral wound. She has history of venous stasis dx. And she reports she has been followed by the wound care center for the LE wounds. She reports they were using zinc based barrier cream on her legs and she was trying to remove the zinc and wash legs when she rubbed top layer of skin off of the RLE. She also has small unstageable pressure ulcer on the right upper buttock. Wound type: partial thickness skin ulceration of the RLE, and unstageable pressure ulcer buttock Pressure Ulcer POA: Yes Measurement: right buttock: 1.0cm x 1.0cm x 0,  RLE two open areas aprox 2.0cm x 2.0cm x 0.2cm  Wound ZDG:UYQIH buttock: 100% black eschar, RLE: pink, moist with epithelial buds. Drainage (amount, consistency, odor)  Minimal RLE, none from the buttock wound Periwound:intact, hemosiderin staining noted bilateral LE. Dressing procedure/placement/frequency: enzymatic debridement ointment for the buttock wound daily to remove non viable tissue. Continue silicone foam for the RLE wound for protection and insulation.  Discussed POC with patient and bedside nurse.  Re consult if needed, will not follow at this time. Thanks  Melody Kellogg, [place] [phone])   [place] # [phone] (per verbal phone) Nanetta Batty RN

## 2012-08-17 NOTE — Progress Notes (Addendum)
Pt son changed pump med and tubing 08-17-12 _0 

## 2012-08-17 NOTE — Progress Notes (Signed)
Pt O3X, in bed, Pt states having lt arm pain since yesterday. Will give tylenol and continue to monitor

## 2012-08-17 NOTE — Consult Note (Signed)
  Patient seen and examined. Full consult note to follow.  On arrival had AFL with RVR. Now resolved. Pulmonary pressure much improved with IV treprostinil. Rash is a common side effect of the medication (not allergy). Currently volume overloaded.  Recs:  1) Continue treprostinil 2) IV lasix tonight. Resume demadex tomorrow. 3) Start Xarelto fo AFL 4) If AFL recurs -> amiodarone. 5) Likely can be d/c'd home soon.   Benay Spice 5:57 PM

## 2012-08-17 NOTE — Progress Notes (Signed)
Pt resting without CPAP. Pt placed on CPAP with oxygen hooked up at 4L [place]. Remodulin pump still on.  Pt with no other complaints. Shelby Mattocks, RN

## 2012-08-17 NOTE — Progress Notes (Signed)
Lab notified MRSA PCR is positive. Pt placed on MRSA PCR standing orders. Shelby Mattocks, RN

## 2012-08-17 NOTE — Progress Notes (Signed)
Subjective: [name] was seen and examined by me this AM.  She is resting in bed in NAD.  She denies CP/SOB at present.   Objective: Vital signs in last 24 hours: Filed Vitals:   08/17/12 0407 08/17/12 0437 08/17/12 0851 08/17/12 1356  BP: 90/48 92/62 94/58 96/54  Pulse: 99  101 97  Temp: 98.3 F (36.8 C)   98.2 F (36.8 C)  TempSrc: Oral   Oral  Resp: _0 Height:      Weight: 104.872 kg (231 lb 3.2 oz)     SpO2: 93%  96% 91%   Weight change:   Intake/Output Summary (Last 24 hours) at 08/17/12 1456 Last data filed at 08/17/12 1356  Gross per 24 hour  Intake    720 ml  Output   1251 ml  Net   -531 ml   General: resting in bed in NAD Cardiac: RRR  Pulm: clear to auscultation bilaterally, moving normal volumes of air Abd: soft, nontender, nondistended, BS present Ext: +2 B/L lower extremity edema Neuro: alert and oriented X3  Lab Results: Basic Metabolic Panel:  Recent Labs Lab 08/16/12 1544 08/16/12 2211 08/17/12 0440  NA 135  --  137  K 4.1  --  3.5  CL 96  --  96  CO2 28  --  28  GLUCOSE 93  --  91  BUN 55*  --  51*  CREATININE 1.82* 1.66* 1.55*  CALCIUM 8.6  --  8.5   CBC:  Recent Labs Lab 08/16/12 1544 08/16/12 2211  WBC 9.7 9.7  HGB 10.3* 10.6*  HCT 31.5* 32.6*  MCV 76.1* 76.3*  PLT 273 284   Cardiac Enzymes:  Recent Labs Lab 08/16/12 2211 08/17/12 0440 08/17/12 1031  TROPONINI <0.30 <0.30 <0.30   BNP:  Recent Labs Lab 08/16/12 1417  PROBNP 7892.0*   CBG:  Recent Labs Lab 08/16/12 2318 08/16/12 2355 08/17/12 0405 08/17/12 0753 08/17/12 1157  GLUCAP 68* 77 95 93 139*    Urinalysis:  Recent Labs Lab 08/16/12 1612  COLORURINE YELLOW  LABSPEC 1.012  PHURINE 5.0  GLUCOSEU NEGATIVE  HGBUR NEGATIVE  BILIRUBINUR SMALL*  KETONESUR NEGATIVE  PROTEINUR NEGATIVE  UROBILINOGEN 1.0  NITRITE NEGATIVE  LEUKOCYTESUR TRACE*    Micro Results: Recent Results (from the past 240 hour(s))  MRSA PCR SCREENING      Status: Abnormal   Collection Time    08/16/12  9:41 PM      Result Value Range Status   MRSA by PCR POSITIVE (*) NEGATIVE Final   Comment:            The GeneXpert MRSA Assay (FDA     approved for NASAL specimens     only), is one component of a     comprehensive MRSA colonization     surveillance program. It is not     intended to diagnose MRSA     infection nor to guide or     monitor treatment for     MRSA infections.     RESULT CALLED TO, READ BACK BY AND VERIFIED WITH:     LEONARD,M RN [id number] AT 0050 SKEEN,P   Studies/Results: Dg Elbow Complete Left  08/17/2012   *RADIOLOGY REPORT*  Clinical Data: Diffuse elbow pain, no trauma  LEFT ELBOW - COMPLETE 3+ VIEW  Comparison: Report of prior similar exam 06/22/2002.  Images are not digitized.  Findings: Normal variant lucency of the radial tuberosity is noted. There is mild  prominence of the anterior fat pad which may indicate a small joint effusion.  No fracture or dislocation is identified.  IMPRESSION: No fracture or dislocation identified.  A small joint effusion may be present which could indicate occult fracture but is nonspecific. Consider follow-up imaging if symptoms persist or bone scan if further evaluation is desired.   Original Report Authenticated By: Conchita Paris, M.D.   Dg Chest Port 1 View  08/17/2012   *RADIOLOGY REPORT*  Clinical Data: Short of breath  PORTABLE CHEST - 1 VIEW  Comparison: Yesterday  Findings: Right subclavian central venous catheter is stable. Cardiomegaly is stable.  Lungs are under aerated and grossly clear. No pneumothorax.  IMPRESSION: No active cardiopulmonary disease.   Original Report Authenticated By: Marybelle Killings, M.D.   Dg Chest Port 1 View  08/16/2012   *RADIOLOGY REPORT*  Clinical Data: Chest pain  PORTABLE CHEST - 1 VIEW  Comparison: 07/14/2012  Findings: Cardiomegaly again noted.  Mild interstitial prominence bilaterally.  Minimal interstitial edema cannot be excluded. Persistent left  basilar atelectasis or infiltrate.  Right lung is clear.  There is a right subclavian catheter with tip in distal SVC.  No diagnostic pneumothorax.  IMPRESSION: Mild interstitial prominence bilaterally.  Minimal interstitial edema cannot be excluded.  Persistent left basilar atelectasis or infiltrate.  Right lung is clear.  There is a right subclavian catheter with tip in distal SVC.  No diagnostic pneumothorax.   Original Report Authenticated By: Lahoma Crocker, M.D.   Medications: I have reviewed the patient's current medications. Scheduled Meds: . [START ON 08/18/2012] antiseptic oral rinse  15 mL Mouth Rinse q12n4p  . [START ON 08/18/2012] chlorhexidine  15 mL Mouth Rinse BID  . Chlorhexidine Gluconate Cloth  6 each Topical Q0600  . collagenase   Topical Daily  . enoxaparin (LOVENOX) injection  40 mg Subcutaneous Q24H  . gabapentin  600 mg Oral TID  . insulin aspart  0-15 Units Subcutaneous Q4H  . mupirocin ointment  1 application Nasal BID  . sodium chloride  3 mL Intravenous Q12H   Continuous Infusions:  PRN Meds:.sodium chloride, acetaminophen, ketoconazole, lidocaine, sodium chloride Assessment/Plan: [name] is a 52 y.o. woman with cor pulmonale with severe pulmonary hypertension (PAP 95/49) on continuous Remodulin infusion, diastolic heart failure (EF 55% in 7/14), restrictive lung disease, OSA/OHS, chronic venous stasis, morbid obesity, mild mental retardation who presents today after an isolated episode of chest pain and tachycardia. We discussed this patient with Dr. Alva Garnet of PCCM given her significant and complicated cardiopulmonary issues.  #Pulmonary hypertension vs. CHF exacerbation - ProBNP elevated to 7000 (baseline ~3000), suggestion of pulmonary edema seen on CXR, some JVD + crackles heard at the lung bases on physical exam, concerning for CHF exacerbation. However patient denies worsening SOB, worsening edema in legs or abdomen, orthopnea, PND. Pulmonary embolism is on  the differential, but symptoms and vital sign abnormalities not persistent, no unilateral calf pain or leg swelling. Also we would expect a more dramatic presentation for PE in a patient with such tenuous right heart function. Her Wells criteria=+1.5, low risk group for PE, 1.3-3% chance of PE. ?Pneumonia with persistent left basilar atelectasis vs. infiltrate on CXR, however no WBC, no fever, no cough or sputum production. Her heart failure is followed by Dr. Haroldine Laws here. Her pulmonary hypertension is followed by Dr. Gilles Chiquito at [facility].  - Telemetry monitoring - cardiology consulted and recommendations appreciated - IV Lasix 60m po, per Dr. BHaroldine Laws- q4h vitals  - Daily weights,  strict Is/Os  - Continue Remodulin infusion - pharmacy on board  - Plan to call [facility] in the am to notify them of her presence and determine if they have any recommendations, or if they feel the patient should be transferred back to [facility] for further management. Dr. Sharion Balloon office # [phone], [phone].   #Chest pain - Resolved. Suspect musculoskeletal/costochondritis given exacerbation with movement when it was present. Pain was reproducible on exam with palpation over left 5th/6th ribs in the midclavicular area. Still, in this patient with significant cardiac history it is important to at least rule out acute MI.  -Trops negative x 3  #Tachycardia - Paroxysmal episodes of tachycardia; she had one episode to ~150bpm at home, and one episode to 139 here on the floor, both of which resolved without intervention. Her heart rate is 90s-100s at baseline. Unclear etiology.    - Telemetry bed with frequent vital sign checks  - Xarelto for Aflutter per Dr. Haroldine Laws  #Hypotension - 80-90s/40-60s on admission. This appears to be around her baseline. Remodulin is a vasodilator and is known to cause hypotension.  - Telemetry bed with vital signs q4h   #OSA - Sleep-disordered breathing thought to be a driver of her cor  pulmonale. On CPAP at home and reports compliance.  - Continue CPAP qhs   #Diabetes - Stable. Last A1C 6.0% in 5/14. - Moderate SSI  - Continue gabapentin for peripheral neuropathy  - Holding metformin   #CKD, stage 3 - Baseline Cr seems to be around 1.0-1.2 with GFR 60-70, but with a significantly elevated Cr up to 4.69 during her last hospitalization thought 2/2 volume loss from diarrhea. This admission, Cr 1.82, which is up from 1.44 on 8/7. Will continue to monitor.  - Holding diuretics and metformin on admission.  - AM BMP   #Skin ulcers - At right pretibial area (stage 2), sacrum (stage 1), right gluteal cleft (with eschar). Concern low for cellulitis given lack of pain, no erythema, wound without purulence, no history of fevers or chills.  - Wound care consult  #Left elbow pain - ROM limited by pain, no palpable effusion, pain localized to posterior elbow/ olecranon bursa. ?Olecranon bursitis, but no swelling. Xray shows "mild prominence of the anterior fat pad which may indicate a small joint effusion"; however patient's pain is in the posterior portion of her elbow. Occult fracture unlikely.  - Tylenol prn pain   #Rash - Diffuse, upper and lower extremities.  Thought to be a side effect of her Remodulin. Not particularly bothersome to patient.  - Continue home ketoconazole cream as needed.   #DVT PPX - Lovenox SQ  Dispo: Disposition is deferred at this time, awaiting improvement of current medical problems.  Anticipated discharge in approximately 2-3 day(s).   The patient does have a current PCP Luanne Bras, MD) and does need an [facility] hospital follow-up appointment after discharge.  The patient does not know have transportation limitations that hinder transportation to clinic appointments.  .Services Needed at time of discharge: Y = Yes, Blank = No PT:   OT:   RN:   Equipment:   Other:     LOS: 1 day   Duwaine Maxin, DO 08/17/2012, 2:56 PM

## 2012-08-17 NOTE — Plan of Care (Signed)
Problem: Phase I Progression Outcomes Goal: Aspirin unless contraindicated Outcome: Not Applicable Date Met:  68/03/21 Pt is allergic to aspirin Goal: Voiding-avoid urinary catheter unless indicated Outcome: Completed/Met Date Met:  08/17/12 Pt voiding using bedpan

## 2012-08-17 NOTE — ED Provider Notes (Signed)
Medical screening examination/treatment/procedure(s) were conducted as a shared visit with non-physician practitioner(s) and myself.  I personally evaluated the patient during the encounter patient possible chest pain. Patient is very poor historian and unable to get a history. May require admission  [name]. Alvino Chapel, MD 08/17/12 1538

## 2012-08-17 NOTE — Progress Notes (Signed)
  Echocardiogram 2D Echocardiogram has been performed.  Diamond Nickel 08/17/2012, 9:53 AM

## 2012-08-17 NOTE — H&P (Signed)
Date: 08/17/2012  Patient name: [name]  Medical record number: [id number]  Date of birth: 1960/03/26   I have seen and evaluated [name] and discussed their care with the Residency Team.  [name] is a 52yo F with cor pulmonale on treprostinil infusion who reports new onset of chest pain/pressure in setting of tachycardia of HR of 150s. She felt her symptoms spontaneosly resolved once she was taken to ED. She did have an episode caught on telemetry where she was HR 140s with ECG appears to be AFL with RVR vs. Sinus tach. The patient physical exam c/w fluid overald. She has been recently discharged from [facility] and her pulmonary team. She appears to have some decompensated heart failure.Marland Kitchen   Physical Exam: Blood pressure 83/50, pulse 99, temperature 97.2 F (36.2 C), temperature source Oral, resp. rate 18, height 5' (1.524 m), weight 231 lb 3.2 oz (104.872 kg), SpO2 90.00%.  Constitutional: She is oriented to person, place, and time and well-developed, well-nourished, and in no distress.  HENT:  Head: Normocephalic and atraumatic.  Eyes: Conjunctivae and EOM are normal. Pupils are equal, round, and reactive to light.  Neck: Normal range of motion. Neck supple. JVD (to angle of mandible, but exam limited by body habitus) present.  Cardiovascular: Normal rate, regular rhythm, normal heart sounds and intact distal pulses. Exam reveals no gallop and no friction rub.  No murmur heard. No pitting edema noted in LE. Erythematous skin changes noted at ankles bilaterally.  Pulmonary/Chest: Effort normal. No respiratory distress. She has no wheezes. She has rales (Soft crackles at the lung bases BL, L>R).  Chest wall : hickman catheter in place Abdominal: Soft. Bowel sounds are normal. She exhibits distension (mildly). There is no tenderness.  Musculoskeletal: She exhibits no tenderness (no calf tendereness).  Left elbow: She exhibits decreased range of motion (ROM limited by pain). She  exhibits no swelling, no effusion and no deformity.  Chest wall: Some reproduction of chest pain with palpation over left 5th/6th ribs in the midclavicular area.  Neurological: She is alert and oriented to person, place, and time. No cranial nerve deficit. GCS score is 15.  Skin: Skin is warm and dry. Rash (diffuse macular rash) noted. She is not diaphoretic. There is erythema (at ankles bilaterally).  Right pretibial ulcer, stage 2, with chronic erythematous skin changes and mild warmth, but area is clean, dry, intact with no purulence and no pain. Stage 1 sacral ulcer present. Right gluteal cleft ulcer also present with eschar, unstageable, no surrounding erythema, no purulence.      Lab results: Results for orders placed during the hospital encounter of 08/16/12 (from the past 24 hour(s))  GLUCOSE, CAPILLARY     Status: Abnormal   Collection Time    08/16/12 11:18 PM      Result Value Range   Glucose-Capillary 68 (*) 70 - 99 mg/dL  GLUCOSE, CAPILLARY     Status: None   Collection Time    08/16/12 11:55 PM      Result Value Range   Glucose-Capillary 77  70 - 99 mg/dL  GLUCOSE, CAPILLARY     Status: None   Collection Time    08/17/12  4:05 AM      Result Value Range   Glucose-Capillary 95  70 - 99 mg/dL  TROPONIN I     Status: None   Collection Time    08/17/12  4:40 AM      Result Value Range   Troponin I <  0.30  <0.30 ng/mL  BASIC METABOLIC PANEL     Status: Abnormal   Collection Time    08/17/12  4:40 AM      Result Value Range   Sodium 137  135 - 145 mEq/L   Potassium 3.5  3.5 - 5.1 mEq/L   Chloride 96  96 - 112 mEq/L   CO2 28  19 - 32 mEq/L   Glucose, Bld 91  70 - 99 mg/dL   BUN 51 (*) 6 - 23 mg/dL   Creatinine, Ser 1.55 (*) 0.50 - 1.10 mg/dL   Calcium 8.5  8.4 - 10.5 mg/dL   GFR calc non Af Amer 38 (*) >90 mL/min   GFR calc Af Amer 44 (*) >90 mL/min  GLUCOSE, CAPILLARY     Status: None   Collection Time    08/17/12  7:53 AM      Result Value Range    Glucose-Capillary 93  70 - 99 mg/dL  TROPONIN I     Status: None   Collection Time    08/17/12 10:31 AM      Result Value Range   Troponin I <0.30  <0.30 ng/mL  GLUCOSE, CAPILLARY     Status: Abnormal   Collection Time    08/17/12 11:57 AM      Result Value Range   Glucose-Capillary 139 (*) 70 - 99 mg/dL  GLUCOSE, CAPILLARY     Status: Abnormal   Collection Time    08/17/12  4:17 PM      Result Value Range   Glucose-Capillary 123 (*) 70 - 99 mg/dL    Imaging results:  Dg Elbow Complete Left  08/17/2012   *RADIOLOGY REPORT*  Clinical Data: Diffuse elbow pain, no trauma  LEFT ELBOW - COMPLETE 3+ VIEW  Comparison: Report of prior similar exam 06/22/2002.  Images are not digitized.  Findings: Normal variant lucency of the radial tuberosity is noted. There is mild prominence of the anterior fat pad which may indicate a small joint effusion.  No fracture or dislocation is identified.  IMPRESSION: No fracture or dislocation identified.  A small joint effusion may be present which could indicate occult fracture but is nonspecific. Consider follow-up imaging if symptoms persist or bone scan if further evaluation is desired.   Original Report Authenticated By: Conchita Paris, M.D.   Dg Chest Port 1 View  08/17/2012   *RADIOLOGY REPORT*  Clinical Data: Short of breath  PORTABLE CHEST - 1 VIEW  Comparison: Yesterday  Findings: Right subclavian central venous catheter is stable. Cardiomegaly is stable.  Lungs are under aerated and grossly clear. No pneumothorax.  IMPRESSION: No active cardiopulmonary disease.   Original Report Authenticated By: Marybelle Killings, M.D.   Dg Chest Port 1 View  08/16/2012   *RADIOLOGY REPORT*  Clinical Data: Chest pain  PORTABLE CHEST - 1 VIEW  Comparison: 07/14/2012  Findings: Cardiomegaly again noted.  Mild interstitial prominence bilaterally.  Minimal interstitial edema cannot be excluded. Persistent left basilar atelectasis or infiltrate.  Right lung is clear.  There is a  right subclavian catheter with tip in distal SVC.  No diagnostic pneumothorax.  IMPRESSION: Mild interstitial prominence bilaterally.  Minimal interstitial edema cannot be excluded.  Persistent left basilar atelectasis or infiltrate.  Right lung is clear.  There is a right subclavian catheter with tip in distal SVC.  No diagnostic pneumothorax.   Original Report Authenticated By: Lahoma Crocker, M.D.    Assessment and Plan: I have seen and evaluated the patient as outlined  above. I agree with the formulated Assessment and Plan as detailed in the residents' admission note, with the following changes:   Will get cardiology consultation from Dr. Haroldine Laws to help with management of heart failure to decide in addition to diuresis what other medications should be added  Carlyle Basques, MD 8/14/201410:34 PM

## 2012-08-18 DIAGNOSIS — I5031 Acute diastolic (congestive) heart failure: Secondary | ICD-10-CM

## 2012-08-18 DIAGNOSIS — I279 Pulmonary heart disease, unspecified: Secondary | ICD-10-CM

## 2012-08-18 DIAGNOSIS — E1149 Type 2 diabetes mellitus with other diabetic neurological complication: Secondary | ICD-10-CM

## 2012-08-18 DIAGNOSIS — E785 Hyperlipidemia, unspecified: Secondary | ICD-10-CM

## 2012-08-18 LAB — CBC WITH DIFFERENTIAL/PLATELET
Basophils Absolute: 0 10*3/uL (ref 0.0–0.1)
Eosinophils Absolute: 0.1 10*3/uL (ref 0.0–0.7)
Eosinophils Relative: 1 % (ref 0–5)
Lymphs Abs: 1.3 10*3/uL (ref 0.7–4.0)
MCH: 24.4 pg — ABNORMAL LOW (ref 26.0–34.0)
MCV: 76.8 fL — ABNORMAL LOW (ref 78.0–100.0)
Monocytes Absolute: 0.8 10*3/uL (ref 0.1–1.0)
Neutrophils Relative %: 76 % (ref 43–77)
Platelets: 321 10*3/uL (ref 150–400)
RBC: 4.06 MIL/uL (ref 3.87–5.11)
RDW: 20 % — ABNORMAL HIGH (ref 11.5–15.5)

## 2012-08-18 LAB — BASIC METABOLIC PANEL
BUN: 42 mg/dL — ABNORMAL HIGH (ref 6–23)
CO2: 29 mEq/L (ref 19–32)
Calcium: 8.4 mg/dL (ref 8.4–10.5)
Creatinine, Ser: 1.03 mg/dL (ref 0.50–1.10)

## 2012-08-18 LAB — GLUCOSE, CAPILLARY
Glucose-Capillary: 121 mg/dL — ABNORMAL HIGH (ref 70–99)
Glucose-Capillary: 157 mg/dL — ABNORMAL HIGH (ref 70–99)
Glucose-Capillary: 113 mg/dL — ABNORMAL HIGH (ref 70–99)

## 2012-08-18 MED ORDER — PREDNISONE 20 MG PO TABS
40.0000 mg | ORAL_TABLET | Freq: Every day | ORAL | Status: AC
  Administered 2012-08-18 – 2012-08-20 (×3): 40 mg via ORAL
  Filled 2012-08-18 (×3): qty 2

## 2012-08-18 MED ORDER — POTASSIUM CHLORIDE ER 10 MEQ PO TBCR
40.0000 meq | EXTENDED_RELEASE_TABLET | Freq: Every day | ORAL | Status: DC
  Administered 2012-08-19 – 2012-08-21 (×3): 40 meq via ORAL
  Filled 2012-08-18 (×4): qty 4

## 2012-08-18 NOTE — Clinical Documentation Improvement (Signed)
THIS DOCUMENT IS NOT A PERMANENT PART OF THE MEDICAL RECORD  Please update your documentation with the medical record to reflect your response to this query. If you need help knowing how to do this please call [phone].  08/18/12  Dr. Haroldine Laws,  In a better effort to capture your patient's severity of illness, reflect appropriate length of stay and utilization of resources, a review of the patient medical record has revealed the following indicators:   - Known history of Diastolic Heart Failure and Severe PAH with Cor Pulmonale treated with Remodulin   - Given IV Lasix this admission - normally on Demadex at home   - "Currently volume overloaded" per Consult Note 08/17/12   Based on your clinical judgment, please document the ACUITY of the patient's Heart Failure monitored and treated this admission in the progress notes and discharge summary:  Acuity:  - Acute  - Chronic  - Acute on Chronic    In responding to this query please exercise your independent judgment.    The fact that a query is asked, does not imply that any particular answer is desired or expected.  Reviewed: corrected to a/c diastolic HF  Thank You,  [place] Documentation Specialist: Price

## 2012-08-18 NOTE — Progress Notes (Addendum)
Advanced Heart Failure Rounding Note   Subjective:    Ms. Coryell is a 52 y.o. woman with cognitive impairment, OSA, obesity, diastolic HF and severe PAH c/b by cor pulmonale.  She was recently addmitted to [facility] in July with respiratory failure and RHC showed severe PAH with pulmonary pressures in the 90s. She was transferred to [facility] where she was started on Remodulin with excellent results.   She was admitted last night due to CP and tachycardia. She said she woke up about 7a and took her BP and noticed her pulse was 156 bpm. This was associated with mild CP under her left breast which she felt was indigestion, The CP resolved but when she checked her BP again at 11am it was still fast so she came to ER. Initial ECG showed atypical flutter vs atrial tach. She then converted spontaneously. She now feels fine.  Echo 8/14 with EF 60% RV moderately dilated/HK. RVSP 44mHG  Last night received IV lasix, I/O -1.5 liters however weight is unchanged. Started xarelto yesterday. Denies SOB, CP, or orthopnea.   Objective:   Weight Range:  Vital Signs:   Temp:  [97 F (36.1 C)-98.2 F (36.8 C)] 97 F (36.1 C) (08/15 0502) Pulse Rate:  [91-110] 99 (08/15 1010) Resp:  [16-20] 18 (08/15 1010) BP: (83-115)/(50-76) 104/67 mmHg (08/15 1010) SpO2:  [90 %-99 %] 99 % (08/15 1010) Weight:  [231 lb 3.2 oz (104.872 kg)] 231 lb 3.2 oz (104.872 kg) (08/15 0502) Last BM Date: 08/16/12  Weight change: Filed Weights   08/16/12 2025 08/17/12 0407 08/18/12 0502  Weight: 227 lb 12.8 oz (103.329 kg) 231 lb 3.2 oz (104.872 kg) 231 lb 3.2 oz (104.872 kg)    Intake/Output:   Intake/Output Summary (Last 24 hours) at 08/18/12 1027 Last data filed at 08/18/12 0700  Gross per 24 hour  Intake    780 ml  Output   2603 ml  Net  -1823 ml     Physical Exam: General:  Chronically ill appearing. No resp difficulty HEENT: normal Neck: supple. JVP 7-8 . Carotids 2+ bilat; no bruits. No lymphadenopathy or thryomegaly  appreciated. Cor: PMI nondisplaced. Regular rate & rhythm. +RV lift. 2/6 TR. Loud P2 Lungs: clear Abdomen: obese, soft, nontender, nondistended. No hepatosplenomegaly. No bruits or masses. Good bowel sounds. Extremities: no cyanosis, clubbing, rash, 1+ edema; rash on forearms and calves +LE ulcer Neuro: alert & orientedx3, cranial nerves grossly intact. moves all 4 extremities w/o difficulty. Affect pleasant  Telemetry: SR  Labs: Basic Metabolic Panel:  Recent Labs Lab 08/16/12 1544 08/16/12 2211 08/17/12 0440 08/18/12 0640  NA 135  --  137 136  K 4.1  --  3.5 3.7  CL 96  --  96 95*  CO2 28  --  28 29  GLUCOSE 93  --  91 104*  BUN 55*  --  51* 42*  CREATININE 1.82* 1.66* 1.55* 1.03  CALCIUM 8.6  --  8.5 8.4    Liver Function Tests: No results found for this basename: AST, ALT, ALKPHOS, BILITOT, PROT, ALBUMIN,  in the last 168 hours No results found for this basename: LIPASE, AMYLASE,  in the last 168 hours No results found for this basename: AMMONIA,  in the last 168 hours  CBC:  Recent Labs Lab 08/16/12 1544 08/16/12 2211 08/18/12 0640  WBC 9.7 9.7 9.4  NEUTROABS  --   --  7.2  HGB 10.3* 10.6* 9.9*  HCT 31.5* 32.6* 31.2*  MCV 76.1* 76.3* 76.8*  PLT 273 284 321    Cardiac Enzymes:  Recent Labs Lab 08/16/12 1544 08/16/12 2211 08/17/12 0440 08/17/12 1031  TROPONINI <0.30 <0.30 <0.30 <0.30    BNP: BNP (last 3 results)  Recent Labs  07/12/12 1645 08/06/12 1328 08/16/12 1417  PROBNP 22696.0* 3626.0* 7892.0*     Imaging: Dg Elbow Complete Left  08/17/2012   *RADIOLOGY REPORT*  Clinical Data: Diffuse elbow pain, no trauma  LEFT ELBOW - COMPLETE 3+ VIEW  Comparison: Report of prior similar exam 06/22/2002.  Images are not digitized.  Findings: Normal variant lucency of the radial tuberosity is noted. There is mild prominence of the anterior fat pad which may indicate a small joint effusion.  No fracture or dislocation is identified.  IMPRESSION: No  fracture or dislocation identified.  A small joint effusion may be present which could indicate occult fracture but is nonspecific. Consider follow-up imaging if symptoms persist or bone scan if further evaluation is desired.   Original Report Authenticated By: Conchita Paris, M.D.   Dg Chest Port 1 View  08/17/2012   *RADIOLOGY REPORT*  Clinical Data: Short of breath  PORTABLE CHEST - 1 VIEW  Comparison: Yesterday  Findings: Right subclavian central venous catheter is stable. Cardiomegaly is stable.  Lungs are under aerated and grossly clear. No pneumothorax.  IMPRESSION: No active cardiopulmonary disease.   Original Report Authenticated By: Marybelle Killings, M.D.   Dg Chest Port 1 View  08/16/2012   *RADIOLOGY REPORT*  Clinical Data: Chest pain  PORTABLE CHEST - 1 VIEW  Comparison: 07/14/2012  Findings: Cardiomegaly again noted.  Mild interstitial prominence bilaterally.  Minimal interstitial edema cannot be excluded. Persistent left basilar atelectasis or infiltrate.  Right lung is clear.  There is a right subclavian catheter with tip in distal SVC.  No diagnostic pneumothorax.  IMPRESSION: Mild interstitial prominence bilaterally.  Minimal interstitial edema cannot be excluded.  Persistent left basilar atelectasis or infiltrate.  Right lung is clear.  There is a right subclavian catheter with tip in distal SVC.  No diagnostic pneumothorax.   Original Report Authenticated By: Lahoma Crocker, M.D.      Medications:     Scheduled Medications: . antiseptic oral rinse  15 mL Mouth Rinse q12n4p  . chlorhexidine  15 mL Mouth Rinse BID  . Chlorhexidine Gluconate Cloth  6 each Topical Q0600  . collagenase   Topical Daily  . gabapentin  600 mg Oral TID  . insulin aspart  0-15 Units Subcutaneous Q4H  . mupirocin ointment  1 application Nasal BID  . rivaroxaban  20 mg Oral Q supper  . sodium chloride  3 mL Intravenous Q12H  . torsemide  100 mg Oral Daily     Infusions: . treprostinil (REMODULIN) infusion        PRN Medications:  sodium chloride, acetaminophen, ketoconazole, lidocaine, sodium chloride   Assessment:   1. Chest pain likely due to #2 2. AFL with RVR now in sinus rhythm  3. Severe PAH with cor pulmonale  4. Acute on chronic diastolic HF  5. Cognitive impairment  6. Rash  Plan/Discussion:    IV lasix given last night, 24 hr I/O out 1.5 liters, weight unchanged. Will continue IV lasix x 1 more day. Apply TED hose. Complaining of L elbow pain likely d/t gout. Will give 40 mg prednisone for 3 days.  Length of Stay: 2   Rande Brunt 08/18/2012, 10:27 AM  Advanced Heart Failure Team Pager [phone] (M-F; 7a - 4p)  Please contact   Cardiology for night-coverage after hours (4p -7a ) and weekends on [email or web address]  Patient seen and examined with Junie Bame, NP. We discussed all aspects of the encounter. I agree with the assessment and plan as stated above.   She is improving. No further AFL on tele. Diuresing OK. L elbow pain is likely gout and we have written for 3 day course of oral prednisone. Would continue IV lasix for one more day. Can place TED hose.   Likely can d/c in am on home diuretics (torsemide 100 daily) and IV Remodulin. If AFL recurs will likely need amiodarone. Continue Xarelto for anticoagulation. Watch sugars on prednisone.   Daniel Bensimhon,MD 1:44 PM

## 2012-08-18 NOTE — Progress Notes (Signed)
Called by RN earlier and asked to review telemetry as she had some concern that patient was intermittently experiencing jxnl rhythm (with reported HR of 100).  I reviewed tele and found sinus rhythm without evidence of jxnl rhythm or any other arrhythmia.  RN also mentioned asymptomatic rash.  Notes reviewed ->Dr. Haroldine Laws addressed on 8/14 and is felt to be a nl response to treprostinil therapy.

## 2012-08-18 NOTE — Progress Notes (Signed)
  Date: 08/18/2012  Patient name: [name]  Medical record number: [id number]  Date of birth: 10-21-1960   This patient has been seen and the plan of care was discussed with the house staff. Please see their note for complete details. I concur with their findings with the following additions/corrections:  Appreciate input by cardiology team. We will plan on continuing diuresis for the patient and if she continues to have improvement, will likely discharge over the weekend. For her new onset aflutter, she has started anticoagulation with pradaxa.  Carlyle Basques, MD 08/18/2012, 10:11 PM

## 2012-08-18 NOTE — Progress Notes (Signed)
Subjective: [name] was seen and examined by me this AM.  She is sitting on the side of the bed eating breakfast.  She denies CP/SOB at present.  She continues to have L elbow pain.  Objective: Vital signs in last 24 hours: Filed Vitals:   08/18/12 0250 08/18/12 0502 08/18/12 1010 08/18/12 1424  BP:  113/59 104/67 104/59  Pulse: 91 110 99 102  Temp:  97 F (36.1 C)    TempSrc:  Axillary    Resp: _0 Height:      Weight:  104.872 kg (231 lb 3.2 oz)    SpO2: 95% 96% 99% 95%   Weight change: 1.225 kg (2 lb 11.2 oz)  Intake/Output Summary (Last 24 hours) at 08/18/12 1940 Last data filed at 08/18/12 1815  Gross per 24 hour  Intake   1143 ml  Output   2303 ml  Net  -1160 ml   General: sitting on side of bed in NAD Cardiac: RRR  Pulm: clear to auscultation bilaterally Abd: soft, nontender, nondistended, BS present Ext: +2 B/L lower extremity edema  Neuro: alert and oriented X3  Lab Results: Basic Metabolic Panel:  Recent Labs Lab 08/17/12 0440 08/18/12 0640  NA 137 136  K 3.5 3.7  CL 96 95*  CO2 28 29  GLUCOSE 91 104*  BUN 51* 42*  CREATININE 1.55* 1.03  CALCIUM 8.5 8.4   CBC:  Recent Labs Lab 08/16/12 2211 08/18/12 0640  WBC 9.7 9.4  NEUTROABS  --  7.2  HGB 10.6* 9.9*  HCT 32.6* 31.2*  MCV 76.3* 76.8*  PLT 284 321   Cardiac Enzymes:  Recent Labs Lab 08/16/12 2211 08/17/12 0440 08/17/12 1031  TROPONINI <0.30 <0.30 <0.30   BNP:  Recent Labs Lab 08/16/12 1417  PROBNP 7892.0*   CBG:  Recent Labs Lab 08/17/12 1943 08/18/12 0013 08/18/12 0503 08/18/12 0740 08/18/12 1137 08/18/12 1644  GLUCAP 116* 121* 105* 111* 157* 113*    Urinalysis:  Recent Labs Lab 08/16/12 1612  COLORURINE YELLOW  LABSPEC 1.012  PHURINE 5.0  GLUCOSEU NEGATIVE  HGBUR NEGATIVE  BILIRUBINUR SMALL*  KETONESUR NEGATIVE  PROTEINUR NEGATIVE  UROBILINOGEN 1.0  NITRITE NEGATIVE  LEUKOCYTESUR TRACE*    Micro Results: Recent Results (from the  past 240 hour(s))  MRSA PCR SCREENING     Status: Abnormal   Collection Time    08/16/12  9:41 PM      Result Value Range Status   MRSA by PCR POSITIVE (*) NEGATIVE Final   Comment:            The GeneXpert MRSA Assay (FDA     approved for NASAL specimens     only), is one component of a     comprehensive MRSA colonization     surveillance program. It is not     intended to diagnose MRSA     infection nor to guide or     monitor treatment for     MRSA infections.     RESULT CALLED TO, READ BACK BY AND VERIFIED WITH:     LEONARD,M RN [id number] AT 0050 SKEEN,P   Studies/Results: Dg Elbow Complete Left  08/17/2012   *RADIOLOGY REPORT*  Clinical Data: Diffuse elbow pain, no trauma  LEFT ELBOW - COMPLETE 3+ VIEW  Comparison: Report of prior similar exam 06/22/2002.  Images are not digitized.  Findings: Normal variant lucency of the radial tuberosity is noted. There is mild prominence of the anterior fat pad  which may indicate a small joint effusion.  No fracture or dislocation is identified.  IMPRESSION: No fracture or dislocation identified.  A small joint effusion may be present which could indicate occult fracture but is nonspecific. Consider follow-up imaging if symptoms persist or bone scan if further evaluation is desired.   Original Report Authenticated By: Conchita Paris, M.D.   Dg Chest Port 1 View  08/17/2012   *RADIOLOGY REPORT*  Clinical Data: Short of breath  PORTABLE CHEST - 1 VIEW  Comparison: Yesterday  Findings: Right subclavian central venous catheter is stable. Cardiomegaly is stable.  Lungs are under aerated and grossly clear. No pneumothorax.  IMPRESSION: No active cardiopulmonary disease.   Original Report Authenticated By: Marybelle Killings, M.D.   Medications: I have reviewed the patient's current medications. Scheduled Meds: . antiseptic oral rinse  15 mL Mouth Rinse q12n4p  . chlorhexidine  15 mL Mouth Rinse BID  . Chlorhexidine Gluconate Cloth  6 each Topical Q0600  .  collagenase   Topical Daily  . gabapentin  600 mg Oral TID  . insulin aspart  0-15 Units Subcutaneous Q4H  . mupirocin ointment  1 application Nasal BID  . [START ON 08/19/2012] potassium chloride  40 mEq Oral Daily  . predniSONE  40 mg Oral Q breakfast  . rivaroxaban  20 mg Oral Q supper  . sodium chloride  3 mL Intravenous Q12H  . torsemide  100 mg Oral Daily   Continuous Infusions: . treprostinil (REMODULIN) infusion     PRN Meds:.sodium chloride, acetaminophen, ketoconazole, lidocaine, sodium chloride Assessment/Plan: [name] is a 52 y.o. woman with cor pulmonale with severe pulmonary hypertension (PAP 95/49) on continuous Remodulin infusion, diastolic heart failure (EF 55% in 7/14), restrictive lung disease, OSA/OHS, chronic venous stasis, morbid obesity, mild mental retardation who presents today after an isolated episode of chest pain and tachycardia. We discussed this patient with Dr. Alva Garnet of PCCM given her significant and complicated cardiopulmonary issues.  #Pulmonary hypertension vs. CHF exacerbation - ProBNP elevated to 7000 (baseline ~3000), suggestion of pulmonary edema seen on CXR, some JVD + crackles heard at the lung bases on physical exam, concerning for CHF exacerbation. However patient denies worsening SOB, worsening edema in legs or abdomen, orthopnea, PND. Pulmonary embolism is on the differential, but symptoms and vital sign abnormalities not persistent, no unilateral calf pain or leg swelling. Also we would expect a more dramatic presentation for PE in a patient with such tenuous right heart function. Her Wells criteria=+1.5, low risk group for PE, 1.3-3% chance of PE. ?Pneumonia with persistent left basilar atelectasis vs. infiltrate on CXR, however no WBC, no fever, no cough or sputum production. Her heart failure is followed by Dr. Haroldine Laws here. Her pulmonary hypertension is followed by Dr. Gilles Chiquito at [facility]  - Telemetry monitoring - cardiology consulted and  recommendations appreciated - IV Lasix 9m po, per Dr. BHaroldine Laws- q4h vitals  - Daily weights, strict Is/Os  - Continue Remodulin infusion - pharmacy on board  - Plan to call [facility] in the am to notify them of her presence and determine if they have any recommendations, or if [facility] Dr. FSharion Balloonoffice # [phone]   #Chest pain - Resolved. Suspect musculoskeletal/costochondritis given exacerbation with movement when it was present. Pain was reproducible on exam with palpation over left 5th/6th ribs in the midclavicular area. Still, in this patient with significant cardiac history it is important to at  least rule out acute MI.  -Trops negative x 3  #Tachycardia - Paroxysmal episodes of tachycardia; she had one episode to ~150bpm at home, and one episode to 139 here on the floor, both of which resolved without intervention. Her heart rate is 90s-100s at baseline. Unclear etiology.    - Telemetry bed with frequent vital sign checks  - Xarelto for Aflutter per Dr. Haroldine Laws  #Hypotension - 80-90s/40-60s on admission. This appears to be around her baseline. Remodulin is a vasodilator and is known to cause hypotension.  - Telemetry bed with vital signs q4h   #OSA - Sleep-disordered breathing thought to be a driver of her cor pulmonale. On CPAP at home and reports compliance.  - Continue CPAP qhs   #Diabetes - Stable. Last A1C 6.0% in 5/14. - Moderate SSI  - Continue gabapentin for peripheral neuropathy  - Holding metformin   #CKD, stage 3 - Baseline Cr seems to be around 1.0-1.2 with GFR 60-70, but with a significantly elevated Cr up to 4.69 during her last hospitalization thought 2/2 volume loss from diarrhea. This admission, Cr 1.82, which is up from 1.44 on 8/7. Will continue to monitor. Cr 1.03 today. - Holding diuretics and metformin on admission.  - AM BMP   #Skin ulcers - At right pretibial area  (stage 2), sacrum (stage 1), right gluteal cleft (with eschar). Concern low for cellulitis given lack of pain, no erythema, wound without purulence, no history of fevers or chills.  - Wound care consult  #Left elbow pain - ROM limited by pain, no palpable effusion, pain localized to posterior elbow/ olecranon bursa. Possible olecranon bursitis.  Xray shows "mild prominence of the anterior fat pad which may indicate a small joint effusion"; however patient's pain is in the posterior portion of her elbow. Occult fracture unlikely.  - Tylenol prn pain   #Rash - Diffuse, upper and lower extremities.  Thought to be a side effect of her Remodulin. Not particularly bothersome to patient.  - Continue home ketoconazole cream as needed.   #DVT PPX - Lovenox SQ  Dispo: Disposition is deferred at this time, awaiting improvement of current medical problems.  Anticipated discharge in approximately 1-2 day(s).   The patient does have a current PCP Luanne Bras, MD) and does need an [facility] hospital follow-up appointment after discharge.  The patient does not know have transportation limitations that hinder transportation to clinic appointments.  .Services Needed at time of discharge: Y = Yes, Blank = No PT:   OT:   RN:   Equipment:   Other:     LOS: 2 days   Duwaine Maxin, DO 08/18/2012, 7:40 PM

## 2012-08-18 NOTE — Progress Notes (Signed)
Pt alert and oriented times 4, up to chair today and BSC, tolerated well, pt having arrythmia on the monitor, Chris notified, no new orders given, pt has rash to bilateral arms, legs, and buttocks, Gerald Stabs made aware, complained of pain to left elbow, heat pack applied, pain subsided, able to communicate needs, heart pump remains intact and operating per patient, will continue to monitor.

## 2012-08-18 NOTE — Progress Notes (Signed)
[facility]  Patient Status: Active (receiving services up to time of hospitalization)  [facility] is providing the following services: RN  If patient discharges after hours, please call [phone].   [name] 08/18/2012, 3:41 PM

## 2012-08-18 NOTE — Progress Notes (Signed)
UR COMPLETED

## 2012-08-18 NOTE — Care Management Note (Addendum)
  Page 2 of 2   08/18/2012     3:38:46 PM   CARE MANAGEMENT NOTE 08/18/2012  Patient:  [name], [name]   Account Number:  [id number]  Date Initiated:  08/18/2012  Documentation initiated by:  [facility]  Subjective/Objective Assessment:   52yo F with cor pulmonale on treprostinil infusion who reports new onset of chest pain/pressure in setting of tachycardia of HR of 150s. //hm with son     Action/Plan:   monitor//xarelto benefits check; resumption of care   Anticipated DC Date:  08/21/2012   Anticipated DC Plan:  [place]  CM consult      [facility] Choice  Resumption Of Svcs/PTA Provider   Choice offered to / List presented to:          [facility] arranged  HH-1 RN  [place].   Status of service:   Medicare Important Message given?   (If response is "NO", the following Medicare IM given date fields will be blank) Date Medicare IM given:   Date Additional Medicare IM given:    Discharge Disposition:    Per UR Regulation:    If discussed at Long Length of Stay Meetings, dates discussed:    Comments:  08/18/12 [place], RN, BSN, [employer] [phone] Spoke with pt at bedside regarding discharge planning.  Pt currently receives [facility] services through [place].  Janae Sauce notified.  Pt benefits check for Xarelto 36m resulted in $3.00 co-pay.

## 2012-08-19 DIAGNOSIS — N179 Acute kidney failure, unspecified: Secondary | ICD-10-CM

## 2012-08-19 DIAGNOSIS — J309 Allergic rhinitis, unspecified: Secondary | ICD-10-CM

## 2012-08-19 LAB — CBC WITH DIFFERENTIAL/PLATELET
Eosinophils Absolute: 0 10*3/uL (ref 0.0–0.7)
Eosinophils Relative: 0 % (ref 0–5)
Hemoglobin: 10 g/dL — ABNORMAL LOW (ref 12.0–15.0)
Lymphocytes Relative: 14 % (ref 12–46)
MCH: 23.9 pg — ABNORMAL LOW (ref 26.0–34.0)
Monocytes Absolute: 0.1 10*3/uL (ref 0.1–1.0)
Neutrophils Relative %: 85 % — ABNORMAL HIGH (ref 43–77)
Platelets: 302 10*3/uL (ref 150–400)
RBC: 4.18 MIL/uL (ref 3.87–5.11)

## 2012-08-19 LAB — GLUCOSE, CAPILLARY
Glucose-Capillary: 181 mg/dL — ABNORMAL HIGH (ref 70–99)
Glucose-Capillary: 118 mg/dL — ABNORMAL HIGH (ref 70–99)
Glucose-Capillary: 190 mg/dL — ABNORMAL HIGH (ref 70–99)
Glucose-Capillary: 157 mg/dL — ABNORMAL HIGH (ref 70–99)

## 2012-08-19 LAB — BASIC METABOLIC PANEL
BUN: 31 mg/dL — ABNORMAL HIGH (ref 6–23)
Chloride: 95 mEq/L — ABNORMAL LOW (ref 96–112)
GFR calc Af Amer: >90 mL/min (ref >90)
GFR calc non Af Amer: 83 mL/min — ABNORMAL LOW (ref >90)
Potassium: 3.2 mEq/L — ABNORMAL LOW (ref 3.5–5.1)
Sodium: 137 mEq/L (ref 135–145)

## 2012-08-19 MED ORDER — POTASSIUM CHLORIDE CRYS ER 20 MEQ PO TBCR
40.0000 meq | EXTENDED_RELEASE_TABLET | Freq: Once | ORAL | Status: AC
  Administered 2012-08-19: 40 meq via ORAL
  Filled 2012-08-19: qty 2

## 2012-08-19 MED ORDER — INSULIN ASPART 100 UNIT/ML ~~LOC~~ SOLN
0.0000 [IU] | Freq: Three times a day (TID) | SUBCUTANEOUS | Status: DC
Start: 2012-08-20 — End: 2012-08-23
  Administered 2012-08-20 (×2): 3 [IU] via SUBCUTANEOUS
  Administered 2012-08-21: 2 [IU] via SUBCUTANEOUS
  Administered 2012-08-21 – 2012-08-23 (×3): 1 [IU] via SUBCUTANEOUS
  Administered 2012-08-23: 7 [IU] via SUBCUTANEOUS

## 2012-08-19 NOTE — Progress Notes (Signed)
[name], female   DOB: November 14, 1960, 52 y.o.   MRN: [id number]    Subjective:    [name] is a 52 y.o. woman with cognitive impairment, OSA, obesity, diastolic HF and severe PAH c/b by cor pulmonale.  She was recently addmitted to [facility] in July with respiratory failure and RHC showed severe PAH with pulmonary pressures in the 90s. She was transferred to [facility] where she was started on Remodulin with excellent results.   She was admitted last night due to CP and tachycardia. She said she woke up about 7a and took her BP and noticed her pulse was 156 bpm. This was associated with mild CP under her left breast which she felt was indigestion, The CP resolved but when she checked her BP again at 11am it was still fast so she came to ER. Initial ECG showed atypical flutter vs atrial tach. She then converted spontaneously. She now feels fine.  Echo 8/14 with EF 60% RV moderately dilated/HK. RVSP 2mHG  Last night received IV lasix, I/O -1.5 liters however weight is unchanged. Started xarelto yesterday. Denies SOB, CP, or orthopnea.   Objective:   Weight Range:  Vital Signs:   Temp:  [97.6 F (36.4 C)-98 F (36.7 C)] 97.6 F (36.4 C) (08/16 0508) Pulse Rate:  [91-102] 91 (08/16 0508) Resp:  [18-20] 18 (08/16 0508) BP: (102-108)/(53-59) 102/53 mmHg (08/16 0508) SpO2:  [93 %-95 %] 94 % (08/16 0508) Weight:  [229 lb 6.4 oz (104.055 kg)] 229 lb 6.4 oz (104.055 kg) (08/16 0508) Last BM Date: 08/18/12  Weight change: Filed Weights   08/17/12 0407 08/18/12 0502 08/19/12 0508  Weight: 231 lb 3.2 oz (104.872 kg) 231 lb 3.2 oz (104.872 kg) 229 lb 6.4 oz (104.055 kg)    Intake/Output:   Intake/Output Summary (Last 24 hours) at 08/19/12 1047 Last data filed at 08/19/12 0209  Gross per 24 hour  Intake    843 ml  Output   1450 ml  Net   -607 ml     Physical Exam: General:  Chronically ill appearing. No resp difficulty HEENT: normal Hickman left subclavian Neck: supple. JVP  7-8 . Carotids 2+ bilat; no bruits. No lymphadenopathy or thryomegaly appreciated. Cor: PMI nondisplaced. Regular rate & rhythm. +RV lift. 2/6 TR. Loud P2 Lungs: clear Abdomen: obese, soft, nontender, nondistended. No hepatosplenomegaly. No bruits or masses. Good bowel sounds. Extremities: no cyanosis, clubbing, rash, 1+ edema; rash on forearms and calves +LE ulcer Neuro: alert & orientedx3, cranial nerves grossly intact. moves all 4 extremities w/o difficulty. Affect pleasant  Telemetry: SR  Labs: Basic Metabolic Panel:  Recent Labs Lab 08/16/12 1544 08/16/12 2211 08/17/12 0440 08/18/12 0640 08/19/12 0500  NA 135  --  137 136 137  K 4.1  --  3.5 3.7 3.2*  CL 96  --  96 95* 95*  CO2 28  --  28 29 32  GLUCOSE 93  --  91 104* 128*  BUN 55*  --  51* 42* 31*  CREATININE 1.82* 1.66* 1.55* 1.03 0.81  CALCIUM 8.6  --  8.5 8.4 8.2*     CBC:  Recent Labs Lab 08/16/12 1544 08/16/12 2211 08/18/12 0640 08/19/12 0500  WBC 9.7 9.7 9.4 9.0  NEUTROABS  --   --  7.2 7.6  HGB 10.3* 10.6* 9.9* 10.0*  HCT 31.5* 32.6* 31.2* 31.9*  MCV 76.1* 76.3* 76.8* 76.3*  PLT 273 284 321 302    Cardiac Enzymes:  Recent Labs Lab 08/16/12  1544 08/16/12 2211 08/17/12 0440 08/17/12 1031  TROPONINI <0.30 <0.30 <0.30 <0.30    BNP: BNP (last 3 results)  Recent Labs  07/12/12 1645 08/06/12 1328 08/16/12 1417  PROBNP 22696.0* 3626.0* 7892.0*      Medications:     Scheduled Medications: . antiseptic oral rinse  15 mL Mouth Rinse q12n4p  . chlorhexidine  15 mL Mouth Rinse BID  . Chlorhexidine Gluconate Cloth  6 each Topical Q0600  . collagenase   Topical Daily  . gabapentin  600 mg Oral TID  . insulin aspart  0-15 Units Subcutaneous Q4H  . mupirocin ointment  1 application Nasal BID  . potassium chloride  40 mEq Oral Daily  . predniSONE  40 mg Oral Q breakfast  . rivaroxaban  20 mg Oral Q supper  . sodium chloride  3 mL Intravenous Q12H  . torsemide  100 mg Oral Daily     Infusions: . treprostinil (REMODULIN) infusion      PRN Medications: sodium chloride, acetaminophen, ketoconazole, lidocaine, sodium chloride   Assessment:   1. Chest pain likely due to #2 2. AFL with RVR now in sinus rhythm  3. Severe PAH with cor pulmonale  4. Acute on chronic diastolic HF  5. Cognitive impairment  6. Rash  Plan/Discussion:    Flutter:  On xarelto no recurrence  Plan is for amiodarone if so.   PHT:  On remodulin.  Rash in arms is worse.  Started on prednisone for ? Gout in left elbow Favor keeping in hospital until rash and arm pain is stable and also to monitor BS on roids Son who is well versed in her care agrees Will likely need to be here until Monday

## 2012-08-19 NOTE — Progress Notes (Signed)
Subjective:  -Patient's SOB improved significantly. But has rashes on the arms and has L elbow pain.  - UOP 1950 and net In/Out -2961 since admission.  - Started xarelto on 8/14. Denies SOB, CP, or orthopnea.    - Echo 8/14 with EF 60% RV moderately dilated/HK. RVSP 56mHG.  - per Dr. NJohnsie Cancel patient is not ready to be discharged today.  Objective: Vital signs in last 24 hours: Filed Vitals:   08/18/12 2106 08/19/12 0058 08/19/12 0508 08/19/12 1300  BP: 108/56  102/53 105/66  Pulse: 94 98 91 95  Temp: 98 F (36.7 C)  97.6 F (36.4 C) 97.4 F (36.3 C)  TempSrc: Oral  Oral Oral  Resp: _0 Height:      Weight:   229 lb 6.4 oz (104.055 kg)   SpO2: 93% 95% 94% 99%   Weight change: -1 lb 12.8 oz (-0.816 kg)  Intake/Output Summary (Last 24 hours) at 08/19/12 1503 Last data filed at 08/19/12 1447  Gross per 24 hour  Intake   1040 ml  Output   2200 ml  Net  -1160 ml   General: sitting on side of bed in NAD Cardiac: RRR  Pulm: clear to auscultation bilaterally Abd: soft, nontender, nondistended, BS present Ext: +2 B/L lower extremity edema  Neuro: alert and oriented X3  Lab Results: Basic Metabolic Panel:  Recent Labs Lab 08/18/12 0640 08/19/12 0500  NA 136 137  K 3.7 3.2*  CL 95* 95*  CO2 29 32  GLUCOSE 104* 128*  BUN 42* 31*  CREATININE 1.03 0.81  CALCIUM 8.4 8.2*   CBC:  Recent Labs Lab 08/18/12 0640 08/19/12 0500  WBC 9.4 9.0  NEUTROABS 7.2 7.6  HGB 9.9* 10.0*  HCT 31.2* 31.9*  MCV 76.8* 76.3*  PLT 321 302   Cardiac Enzymes:  Recent Labs Lab 08/16/12 2211 08/17/12 0440 08/17/12 1031  TROPONINI <0.30 <0.30 <0.30   BNP:  Recent Labs Lab 08/16/12 1417  PROBNP 7892.0*   CBG:  Recent Labs Lab 08/18/12 0740 08/18/12 1137 08/18/12 1644 08/18/12 2025 08/19/12 0017 08/19/12 0422  GLUCAP 111* 157* 113* 181* 158* 118*    Urinalysis:  Recent Labs Lab 08/16/12 1612  COLORURINE YELLOW  LABSPEC 1.012  PHURINE 5.0   GLUCOSEU NEGATIVE  HGBUR NEGATIVE  BILIRUBINUR SMALL*  KETONESUR NEGATIVE  PROTEINUR NEGATIVE  UROBILINOGEN 1.0  NITRITE NEGATIVE  LEUKOCYTESUR TRACE*    Micro Results: Recent Results (from the past 240 hour(s))  MRSA PCR SCREENING     Status: Abnormal   Collection Time    08/16/12  9:41 PM      Result Value Range Status   MRSA by PCR POSITIVE (*) NEGATIVE Final   Comment:            The GeneXpert MRSA Assay (FDA     approved for NASAL specimens     only), is one component of a     comprehensive MRSA colonization     surveillance program. It is not     intended to diagnose MRSA     infection nor to guide or     monitor treatment for     MRSA infections.     RESULT CALLED TO, READ BACK BY AND VERIFIED WITH:     LEONARD,M RN [phone] 0050 SKEEN,P   Studies/Results: No results found. Medications: I have reviewed the patient's current medications. Scheduled Meds: . antiseptic oral rinse  15 mL Mouth Rinse q12n4p  . chlorhexidine  15 mL Mouth Rinse BID  . Chlorhexidine Gluconate Cloth  6 each Topical Q0600  . collagenase   Topical Daily  . gabapentin  600 mg Oral TID  . insulin aspart  0-15 Units Subcutaneous Q4H  . mupirocin ointment  1 application Nasal BID  . potassium chloride  40 mEq Oral Daily  . predniSONE  40 mg Oral Q breakfast  . rivaroxaban  20 mg Oral Q supper  . sodium chloride  3 mL Intravenous Q12H  . torsemide  100 mg Oral Daily   Continuous Infusions: . treprostinil (REMODULIN) infusion     PRN Meds:.sodium chloride, acetaminophen, ketoconazole, lidocaine, sodium chloride Assessment/Plan: [name] is a 52 y.o. woman with cor pulmonale with severe pulmonary hypertension (PAP 95/49) on continuous Remodulin infusion, diastolic heart failure (EF 55% in 7/14), restrictive lung disease, OSA/OHS, chronic venous stasis, morbid obesity, mild mental retardation who presents today after an isolated episode of chest pain and tachycardia. We discussed  this patient with Dr. Alva Garnet of PCCM given her significant and complicated cardiopulmonary issues.  #Pulmonary hypertension vs. CHF exacerbation - ProBNP elevated to 7000 (baseline ~3000), suggestion of pulmonary edema seen on CXR, some JVD + crackles heard at the lung bases on physical exam, concerning for CHF exacerbation. However patient denies worsening SOB, worsening edema in legs or abdomen, orthopnea, PND. Pulmonary embolism is on the differential, but symptoms and vital sign abnormalities not persistent, no unilateral calf pain or leg swelling. Also we would expect a more dramatic presentation for PE in a patient with such tenuous right heart function. Her Wells criteria=+1.5, low risk group for PE, 1.3-3% chance of PE. ?Pneumonia with persistent left basilar atelectasis vs. infiltrate on CXR, however no WBC, no fever, no cough or sputum production. Her heart failure is followed by Dr. Haroldine Laws here. Her pulmonary hypertension is followed by Dr. Gilles Chiquito at [facility].   - Telemetry monitoring - cardiology consulted and recommendations appreciated - continue oral torsemide 100 mg daily - Daily weights, strict Is/Os  - Continue Remodulin infusion - pharmacy on board  - Plan to call [facility] in the am to notify them of her presence and determine if they have any recommendations, or if they feel the patient should be transferred back to [facility] for further management. Dr. Sharion Balloon office # [phone], [phone].   #Chest pain - Resolved. Suspect musculoskeletal/costochondritis given exacerbation with movement when it was present. Pain was reproducible on exam with palpation over left 5th/6th ribs in the midclavicular area. Still, in this patient with significant cardiac history it is important to at least rule out acute MI.  -Trops negative x 3  #Tachycardia - Paroxysmal episodes of tachycardia; Her heart rate is 90s-100s at baseline.     - Telemetry bed with frequent vital sign checks  - Xarelto for  Aflutter per Dr. Haroldine Laws  #Hypotension - 80-90s/40-60s on admission. Currently 102/53 mmHg in AM. This appears to be around her baseline. Remodulin is a vasodilator and is known to cause hypotension.  - Telemetry bed with vital signs q4h   #OSA - Sleep-disordered breathing thought to be a driver of her cor pulmonale. On CPAP at home and reports compliance.  - Continue CPAP qhs   #Diabetes - Stable. Last A1C 6.0% in 5/14. - Moderate SSI  - Continue gabapentin for peripheral neuropathy  - Holding metformin   #CKD, stage 3 - Baseline Cr seems to be around 1.0-1.2 with GFR 60-70, but with a significantly elevated Cr up to 4.69 during her  last hospitalization thought 2/2 volume loss from diarrhea. This admission, Cr 1.82, which is up from 1.44 on 8/7. Will continue to monitor. Cr 0.87 today. - Holding metformin on admission.  - f/u BMP   #Skin ulcers - At right pretibial area (stage 2), sacrum (stage 1), right gluteal cleft (with eschar). Concern low for cellulitis given lack of pain, no erythema, wound without purulence, no history of fevers or chills.  - Wound care consult  #Left elbow pain - ROM limited by pain, no palpable effusion, pain localized to posterior elbow/ olecranon bursa. Possible olecranon bursitis.  Xray shows "mild prominence of the anterior fat pad which may indicate a small joint effusion"; however patient's pain is in the posterior portion of her elbow. Occult fracture unlikely.  - Tylenol prn pain   #Rash - Diffuse, upper and lower extremities.  Thought to be a side effect of her Remodulin. Not particularly bothersome to patient.  - Continue home ketoconazole cream as needed.  - added prednisone 40 mg on 8/15 X 3 days  #DVT PPX - Lovenox SQ  Dispo: Disposition is deferred at this time, awaiting improvement of current medical problems.  Anticipated discharge in approximately 1-2 day(s).   The patient does have a current PCP Luanne Bras, MD) and does need an [facility]  hospital follow-up appointment after discharge.  The patient does not know have transportation limitations that hinder transportation to clinic appointments.  .Services Needed at time of discharge: Y = Yes, Blank = No PT:   OT:   RN:   Equipment:   Other:     LOS: 3 days   Ivor Costa, MD 08/19/2012, 3:03 PM

## 2012-08-19 NOTE — Progress Notes (Signed)
Pt alert and oriented times 4, NAD noted, up to chair today, tolerated well, son in to change medication for heart today, patient tolerated it well, able to communicate needs, will continue to monitor

## 2012-08-20 ENCOUNTER — Inpatient Hospital Stay (HOSPITAL_COMMUNITY): Payer: Medicaid Other

## 2012-08-20 DIAGNOSIS — E669 Obesity, unspecified: Secondary | ICD-10-CM

## 2012-08-20 LAB — CBC
HCT: 33.3 % — ABNORMAL LOW (ref 36.0–46.0)
MCV: 75.9 fL — ABNORMAL LOW (ref 78.0–100.0)
Platelets: 343 10*3/uL (ref 150–400)
RBC: 4.39 MIL/uL (ref 3.87–5.11)
WBC: 12 10*3/uL — ABNORMAL HIGH (ref 4.0–10.5)

## 2012-08-20 LAB — BASIC METABOLIC PANEL
CO2: 32 mEq/L (ref 19–32)
Calcium: 8.7 mg/dL (ref 8.4–10.5)
Chloride: 96 mEq/L (ref 96–112)
Sodium: 138 mEq/L (ref 135–145)

## 2012-08-20 LAB — GLUCOSE, CAPILLARY

## 2012-08-20 MED ORDER — MEPERIDINE HCL 25 MG/ML IJ SOLN
12.5000 mg | Freq: Three times a day (TID) | INTRAMUSCULAR | Status: DC | PRN
  Administered 2012-08-20: 12.5 mg via INTRAVENOUS
  Administered 2012-08-20: 10:00:00 via INTRAVENOUS
  Administered 2012-08-21 (×3): 12.5 mg via INTRAVENOUS
  Filled 2012-08-20 (×5): qty 1

## 2012-08-20 NOTE — Progress Notes (Signed)
Placed pt. On CPAP auto titrate (Min: 6, Max: 20) via nasal mask with 4L O2 bled in. Pt. Is tolerating CPAP well at this time without any complications.

## 2012-08-20 NOTE — Progress Notes (Signed)
[name], female   DOB: 1960-08-26, 52 y.o.   MRN: [id number]    Subjective:    Biggest issue at this point is left arm pain  Objective:   Weight Range:  Vital Signs:   Temp:  [97.4 F (36.3 C)-98.1 F (36.7 C)] 98.1 F (36.7 C) (08/17 0527) Pulse Rate:  [95-103] 103 (08/17 0527) Resp:  [18-20] 18 (08/17 0527) BP: (103-112)/(65-83) 112/83 mmHg (08/17 0527) SpO2:  [96 %-99 %] 96 % (08/17 0527) Last BM Date: 08/19/12  Weight change: Filed Weights   08/17/12 0407 08/18/12 0502 08/19/12 0508  Weight: 231 lb 3.2 oz (104.872 kg) 231 lb 3.2 oz (104.872 kg) 229 lb 6.4 oz (104.055 kg)    Intake/Output:   Intake/Output Summary (Last 24 hours) at 08/20/12 0745 Last data filed at 08/20/12 0500  Gross per 24 hour  Intake    880 ml  Output   2475 ml  Net  -1595 ml     Physical Exam: General:  Chronically ill appearing. No resp difficulty HEENT: normal Hickman right subclavian Neck: supple. JVP 7-8 . Carotids 2+ bilat; no bruits. No lymphadenopathy or thryomegaly appreciated. Cor: PMI nondisplaced. Regular rate & rhythm. +RV lift. 2/6 TR. Loud P2 Lungs: clear Abdomen: obese, soft, nontender, nondistended. No hepatosplenomegaly. No bruits or masses. Good bowel sounds. Extremities: no cyanosis, clubbing, rash, 1+ edema; rash on forearms and calves +LE ulcer Neuro: alert & orientedx3, cranial nerves grossly intact. moves all 4 extremities w/o difficulty. Affect pleasant  Telemetry: SR  Labs: Basic Metabolic Panel:  Recent Labs Lab 08/16/12 1544 08/16/12 2211 08/17/12 0440 08/18/12 0640 08/19/12 0500  NA 135  --  137 136 137  K 4.1  --  3.5 3.7 3.2*  CL 96  --  96 95* 95*  CO2 28  --  28 29 32  GLUCOSE 93  --  91 104* 128*  BUN 55*  --  51* 42* 31*  CREATININE 1.82* 1.66* 1.55* 1.03 0.81  CALCIUM 8.6  --  8.5 8.4 8.2*     CBC:  Recent Labs Lab 08/16/12 1544 08/16/12 2211 08/18/12 0640 08/19/12 0500  WBC 9.7 9.7 9.4 9.0  NEUTROABS  --    --  7.2 7.6  HGB 10.3* 10.6* 9.9* 10.0*  HCT 31.5* 32.6* 31.2* 31.9*  MCV 76.1* 76.3* 76.8* 76.3*  PLT 273 284 321 302    Cardiac Enzymes:  Recent Labs Lab 08/16/12 1544 08/16/12 2211 08/17/12 0440 08/17/12 1031  TROPONINI <0.30 <0.30 <0.30 <0.30    BNP: BNP (last 3 results)  Recent Labs  07/12/12 1645 08/06/12 1328 08/16/12 1417  PROBNP 22696.0* 3626.0* 7892.0*      Medications:     Scheduled Medications: . antiseptic oral rinse  15 mL Mouth Rinse q12n4p  . chlorhexidine  15 mL Mouth Rinse BID  . Chlorhexidine Gluconate Cloth  6 each Topical Q0600  . collagenase   Topical Daily  . gabapentin  600 mg Oral TID  . insulin aspart  0-9 Units Subcutaneous TID WC  . mupirocin ointment  1 application Nasal BID  . potassium chloride  40 mEq Oral Daily  . rivaroxaban  20 mg Oral Q supper  . sodium chloride  3 mL Intravenous Q12H  . torsemide  100 mg Oral Daily    Infusions: . treprostinil (REMODULIN) infusion      PRN Medications: sodium chloride, acetaminophen, ketoconazole, lidocaine, sodium chloride   Assessment:   1. Chest pain likely due to #2 2.  AFL with RVR now in sinus rhythm  3. Severe PAH with cor pulmonale  4. Acute on chronic diastolic HF  5. Cognitive impairment  6. Rash  Plan/Discussion:    Flutter:  On xarelto no recurrence  Plan is for amiodarone if so.   PHT:  On remodulin.  Rash in arms is worse.  Started on prednisone for ? Gout in left elbow Needs Xray, pain control and further w/u of left arm pain.  Pain in excess of objective physical findings

## 2012-08-20 NOTE — Consult Note (Signed)
WOC consult Note Reason for Consult: Several integumentary system areas of concern:  Right pretibial, right lower buttock (ischial tuberosity) and reddened area between gluteal cleft (intertriginous dermatitis) Wound type: pressure ulcer (right ischial tuberosity), Moisture associated skin damage (MASD), specifically  Intertriginous dermatitis (ITD) and venous insufficiency with full thickness ulcerations on right LE Pressure Ulcer POA: Yes (right ischial tuberosity), Unstageable due to the presence of necrotic tissue in center. Measurement:Right IT:  1.5cm round with soft brown/grey center obscuring depth. Right pretibial area:  Two full thickness ulcerations; the medial measures 2.5cm x 3cm x 0.4cm and the lateral measuring 1.5cm x 1.5cm x 0.4cm. The area in the intertriginous space of the gluteal cleft measures 12cm x 6cm and is blanching erythema. Wound bed: As described above. Drainage (amount, consistency, odor) Scant yellow on old dressing from right ischial tuberosity.  Others: None Periwound:intact. Bilateral LEs with hemosiderin staining and there is some dry, flaking skin in the left pretibial/gaitor area (no broken skin). It should be noted that the patient has a diffuse macular rash on all extremities that is being credited to a medication or medication/medication reaction. Dressing procedure/placement/frequency:I will recommend a soft silicone foam dressing to the right pretibial and right ischial tuberosity ulcerations that will both support autolytic debridement and tissue repair and regeneration. Additionally, a therapeutic support surface with a low air loss feature as well as bilateral Prevalon boots to redistribute pressure at the heels are provided due to her inability to be turned and repositioned due to left arm pain. [facility] nursing team will not follow, but will remain available to this patient, her medical and nursing teams.  Please re-consult if needed. Thanks, Maudie Flakes,  MSN, RN, [facility], [place] [phone])

## 2012-08-20 NOTE — Progress Notes (Signed)
ANTIBIOTIC CONSULT NOTE - INITIAL  Pharmacy Consult for Xarelto (started on 08/17/12) Indication:  Aflutter  Allergies  Allergen Reactions  . Aspirin     REACTION: airway swelling  . Codeine     REACTION: tingling in lips and hard breathing - had reaction at dentist - states "I can't take certain kinds of codeine" - happened maybe 10 yr ago  . Lisinopril     Cough  . Sulfonamide Derivatives     REACTION: airway swelling    Patient Measurements: Height: 5' (152.4 cm) Weight: 229 lb 6.4 oz (104.055 kg) (bed) IBW/kg (Calculated) : 45.5   Vital Signs: Temp: 98.1 F (36.7 C) (08/17 0527) Temp src: Oral (08/17 0527) BP: 112/83 mmHg (08/17 0527) Pulse Rate: 103 (08/17 0527) Intake/Output from previous day: 08/16 0701 - 08/17 0700 In: 880 [P.O.:880] Out: 2475 [Urine:2475] Intake/Output from this shift: Total I/O In: 460 [P.O.:460] Out: 950 [Urine:950]  Labs:  Recent Labs  08/18/12 0640 08/19/12 0500 08/20/12 0544  WBC 9.4 9.0 12.0*  HGB 9.9* 10.0* 10.8*  PLT 321 302 343  CREATININE 1.03 0.81 0.83   Estimated Creatinine Clearance: 87.2 ml/min (by C-G formula based on Cr of 0.83). No results found for this basename: VANCOTROUGH, Corlis Leak, VANCORANDOM, [place], GENTPEAK, GENTRANDOM, TOBRATROUGH, TOBRAPEAK, TOBRARND, AMIKACINPEAK, AMIKACINTROU, AMIKACIN,  in the last 72 hours   Microbiology: Recent Results (from the past 720 hour(s))  MRSA PCR SCREENING     Status: Abnormal   Collection Time    08/16/12  9:41 PM      Result Value Range Status   MRSA by PCR POSITIVE (*) NEGATIVE Final   Comment:            The GeneXpert MRSA Assay (FDA     approved for NASAL specimens     only), is one component of a     comprehensive MRSA colonization     surveillance program. It is not     intended to diagnose MRSA     infection nor to guide or     monitor treatment for     MRSA infections.     RESULT CALLED TO, READ BACK BY AND VERIFIED WITH:     LEONARD,M RN [phone] AT  2094 SKEEN,P    Medical History: Past Medical History  Diagnosis Date  . OSA (obstructive sleep apnea)     CPAP  . Venous stasis ulcer     chornic, ?followed up at wound care center, multiple courses of antibiotics in past for cellulitis, on lasix  . GERD (gastroesophageal reflux disease)   . Anemia, iron deficiency     secondary to menhorrhagia, on oral iron, also b12 def, getting monthly b12 shots  . Chronic cough     secondary to alleriges and post nasal drip  . Depression   . Diabetes mellitus     well controlled on metformin  . Hyperlipidemia   . H/O mental retardation   . Herpes   . Cor pulmonale     PA Peak pressure 74mHg  . Diastolic heart failure   . Restrictive lung disease     PFTs 06/2012 (FVC 54% predicted and FEV1 68% predicted w minimal bronchodilator response).  . CHF (congestive heart failure)   . Renal disorder   . Pulmonary hypertension   . Hypertension   . Shortness of breath   . COPD (chronic obstructive pulmonary disease)     Medications:  Scheduled:  . antiseptic oral rinse  15 mL Mouth Rinse q12n4p  .  chlorhexidine  15 mL Mouth Rinse BID  . Chlorhexidine Gluconate Cloth  6 each Topical Q0600  . collagenase   Topical Daily  . gabapentin  600 mg Oral TID  . insulin aspart  0-9 Units Subcutaneous TID WC  . mupirocin ointment  1 application Nasal BID  . potassium chloride  40 mEq Oral Daily  . rivaroxaban  20 mg Oral Q supper  . sodium chloride  3 mL Intravenous Q12H  . torsemide  100 mg Oral Daily   Assessment: 52 yo female who was started on Xarelto on 08/17/12 for Aflutter with RVR per heart failure service per pharmacy dosing consult.  CrCl was >50 ml/min thus the patient was started on Xarelto 20 mg po daily with supper.  SCr today = 0.83, CrCl ~ 87 ml/min. CBC stable with H/H 10.8/33.3, pltc 343. No signs of bleeding reported.  This AM Telemetry: in sinus rhythm.   Plan:  Continue Xarelto 20 mg po daily with supper. Monitor renal  function, for bleeding symptoms, CBC.   Nicole Cella, RPh Clinical Pharmacist Pager: [phone]  08/20/2012,3:04 PM

## 2012-08-20 NOTE — Progress Notes (Signed)
Subjective: Ms. Mcwright was seen and examined by me this AM.  She is sitting in bed eating breakfast.  She denies CP/SOB but continues to have L elbow pain.  She says she can't move her elbow.  Objective: Vital signs in last 24 hours: Filed Vitals:   08/19/12 1300 08/19/12 1942 08/20/12 0104 08/20/12 0527  BP: 105/66 103/65  112/83  Pulse: 95 99 98 103  Temp: 97.4 F (36.3 C) 97.9 F (36.6 C)  98.1 F (36.7 C)  TempSrc: Oral Oral  Oral  Resp: _0 Height:      Weight:      SpO2: 99% 99% 98% 96%   Weight change:   Intake/Output Summary (Last 24 hours) at 08/20/12 1458 Last data filed at 08/20/12 1343  Gross per 24 hour  Intake    900 ml  Output   2675 ml  Net  -1775 ml   General: sitting in bed in NAD Cardiac: regular rhythm, tachycardic  Pulm: clear to auscultation bilaterally Abd: soft, nontender, nondistended, BS present Ext: +2 B/L lower extremity edema, non-tender lower extremities Neuro: alert and oriented X3  Lab Results: Basic Metabolic Panel:  Recent Labs Lab 08/19/12 0500 08/20/12 0544  NA 137 138  K 3.2* 3.4*  CL 95* 96  CO2 32 32  GLUCOSE 128* 97  BUN 31* 29*  CREATININE 0.81 0.83  CALCIUM 8.2* 8.7   CBC:  Recent Labs Lab 08/18/12 0640 08/19/12 0500 08/20/12 0544  WBC 9.4 9.0 12.0*  NEUTROABS 7.2 7.6  --   HGB 9.9* 10.0* 10.8*  HCT 31.2* 31.9* 33.3*  MCV 76.8* 76.3* 75.9*  PLT 321 302 343   Cardiac Enzymes:  Recent Labs Lab 08/16/12 2211 08/17/12 0440 08/17/12 1031  TROPONINI <0.30 <0.30 <0.30   BNP:  Recent Labs Lab 08/16/12 1417  PROBNP 7892.0*   CBG:  Recent Labs Lab 08/19/12 0843 08/19/12 1212 08/19/12 1625 08/19/12 1939 08/20/12 0606 08/20/12 1150  GLUCAP 171* 190* 157* 171* 104* 211*    Urinalysis:  Recent Labs Lab 08/16/12 1612  COLORURINE YELLOW  LABSPEC 1.012  PHURINE 5.0  GLUCOSEU NEGATIVE  HGBUR NEGATIVE  BILIRUBINUR SMALL*  KETONESUR NEGATIVE  PROTEINUR NEGATIVE  UROBILINOGEN 1.0   NITRITE NEGATIVE  LEUKOCYTESUR TRACE*    Micro Results: Recent Results (from the past 240 hour(s))  MRSA PCR SCREENING     Status: Abnormal   Collection Time    08/16/12  9:41 PM      Result Value Range Status   MRSA by PCR POSITIVE (*) NEGATIVE Final   Comment:            The GeneXpert MRSA Assay (FDA     approved for NASAL specimens     only), is one component of a     comprehensive MRSA colonization     surveillance program. It is not     intended to diagnose MRSA     infection nor to guide or     monitor treatment for     MRSA infections.     RESULT CALLED TO, READ BACK BY AND VERIFIED WITH:     LEONARD,M RN [id number] AT 0050 SKEEN,P   Studies/Results: Dg Elbow 2 Views Left  08/20/2012   CLINICAL DATA:  Severe elbow pain and limited range of motion. No known injury.  EXAM: LEFT ELBOW - 2 VIEW  COMPARISON:  08/16/2012  FINDINGS: No evidence of fracture or dislocation. An elbow joint effusion is again seen. No  signs of abnormal arthropathy identified. Degenerative spurring is seen at the olecranon process.  IMPRESSION: Persistent elbow joint effusion. No acute bone abnormality.   Electronically Signed   By: Earle Gell   On: 08/20/2012 12:34   Medications: I have reviewed the patient's current medications. Scheduled Meds: . antiseptic oral rinse  15 mL Mouth Rinse q12n4p  . chlorhexidine  15 mL Mouth Rinse BID  . Chlorhexidine Gluconate Cloth  6 each Topical Q0600  . collagenase   Topical Daily  . gabapentin  600 mg Oral TID  . insulin aspart  0-9 Units Subcutaneous TID WC  . mupirocin ointment  1 application Nasal BID  . potassium chloride  40 mEq Oral Daily  . rivaroxaban  20 mg Oral Q supper  . sodium chloride  3 mL Intravenous Q12H  . torsemide  100 mg Oral Daily   Continuous Infusions: . treprostinil (REMODULIN) infusion     PRN Meds:.sodium chloride, acetaminophen, ketoconazole, lidocaine, meperidine (DEMEROL) injection, sodium  chloride Assessment/Plan: Ms.Kessa A Hollar is a 52 y.o. woman with cor pulmonale with severe pulmonary hypertension (PAP 95/49) on continuous Remodulin infusion, diastolic heart failure (EF 55% in 7/14), restrictive lung disease, OSA/OHS, chronic venous stasis, morbid obesity, mild mental retardation who presents today after an isolated episode of chest pain and tachycardia. We discussed this patient with Dr. Alva Garnet of PCCM given her significant and complicated cardiopulmonary issues.  #Pulmonary hypertension vs. CHF exacerbation - ProBNP elevated to 7000 (baseline ~3000), suggestion of pulmonary edema seen on CXR, some JVD + crackles heard at the lung bases on physical exam, concerning for CHF exacerbation. However patient denies worsening SOB, worsening edema in legs or abdomen, orthopnea, PND. Pulmonary embolism is on the differential, but symptoms and vital sign abnormalities not persistent, no unilateral calf pain or leg swelling. Also we would expect a more dramatic presentation for PE in a patient with such tenuous right heart function. Her Wells criteria=+1.5, low risk group for PE, 1.3-3% chance of PE. ?Pneumonia with persistent left basilar atelectasis vs. infiltrate on CXR, however no WBC, no fever, no cough or sputum production. Her heart failure is followed by Dr. Haroldine Laws here. Her pulmonary hypertension is followed by Dr. Gilles [facility]iquito at [facility].  - Telemetry monitoring - cardiology consulted and recommendations appreciated - continue po Torsemide 160m daily - Daily weights, strict Is/Os  - Continue Remodulin infusion - pharmacy on board; patient's son will continue to bring in cartridges  #Chest pain - Resolved. Suspect musculoskeletal/costochondritis given exacerbation with movement when it was present. Pain was reproducible on exam with palpation over left 5th/6th ribs in the midclavicular area. Still, in this patient with significant cardiac history it is important to at least rule out  acute MI.  -Trops negative x 3  #Tachycardia - Paroxysmal episodes of tachycardia; she had one episode to ~150bpm at home, and one episode to 139 here on the floor, both of which resolved without intervention. Her heart rate is 90s-100s at baseline. Unclear etiology.    - Telemetry bed with frequent vital sign checks  - Xarelto for Aflutter per Dr. BHaroldine Laws #Hypotension - 80-90s/40-60s on admission. This appears to be around her baseline. Remodulin is a vasodilator and is known to cause hypotension.  - Telemetry bed with vital signs q4h   #OSA - Sleep-disordered breathing thought to be a driver of her cor pulmonale. On CPAP at home and reports compliance.  - Continue CPAP qhs   #Diabetes - Stable. Last A1C 6.0% in 5/14. -  Moderate SSI  - Continue gabapentin for peripheral neuropathy  - Holding metformin   #CKD, stage 3 - Baseline Cr seems to be around 1.0-1.2 with GFR 60-70, but with a significantly elevated Cr up to 4.69 during her last hospitalization thought 2/2 volume loss from diarrhea. This admission, Cr 1.82, which is up from 1.44 on 8/7. Will continue to monitor. Cr 0.83 today. -daily BMPs  #Skin ulcers - At right pretibial area (stage 2), sacrum (stage 1), right gluteal cleft (with eschar). Concern low for cellulitis given lack of pain, no erythema, wound without purulence, no history of fevers or chills.  - Wound care consulted and recommendations appreciated  #Left elbow pain - ROM limited by pain, no palpable effusion, pain localized to posterior elbow/ olecranon bursa. Possible olecranon bursitis.  Initial xray shows "mild prominence of the anterior fat pad which may indicate a small joint effusion"; however patient's pain is in the posterior portion of her elbow. Occult fracture unlikely. Repeat xray 08/17 shows persistent elbow joint effusion, no acute bony abnormalities. - Tylenol prn pain   #Rash - Diffuse, upper and lower extremities.  Thought to be a side effect of her  Remodulin. Not particularly bothersome to patient.  - Continue home ketoconazole cream as needed.   #DVT PPX - Lovenox SQ  Dispo: Disposition is deferred at this time, awaiting improvement of current medical problems.  Anticipated discharge in approximately 1-2 day(s).   The patient does have a current PCP Luanne Bras, MD) and does need an [facility] hospital follow-up appointment after discharge.  The patient does not know have transportation limitations that hinder transportation to clinic appointments.  .Services Needed at time of discharge: Y = Yes, Blank = No PT:   OT:   RN:   Equipment:   Other:     LOS: 4 days   Duwaine Maxin, DO 08/20/2012, 2:58 PM

## 2012-08-21 LAB — CBC WITH DIFFERENTIAL/PLATELET
Eosinophils Absolute: 0 10*3/uL (ref 0.0–0.7)
Eosinophils Relative: 0 % (ref 0–5)
Lymphocytes Relative: 22 % (ref 12–46)
Lymphs Abs: 3 10*3/uL (ref 0.7–4.0)
MCV: 76.5 fL — ABNORMAL LOW (ref 78.0–100.0)
Metamyelocytes Relative: 0 %
Monocytes Absolute: 1 10*3/uL (ref 0.1–1.0)
Monocytes Relative: 7 % (ref 3–12)
Neutro Abs: 9.7 10*3/uL — ABNORMAL HIGH (ref 1.7–7.7)
Neutrophils Relative %: 71 % (ref 43–77)
Platelets: 320 10*3/uL (ref 150–400)
RBC: 4.34 MIL/uL (ref 3.87–5.11)
WBC: 13.7 10*3/uL — ABNORMAL HIGH (ref 4.0–10.5)
nRBC: 0 /100 WBC

## 2012-08-21 LAB — BASIC METABOLIC PANEL
CO2: 30 mEq/L (ref 19–32)
Calcium: 8.6 mg/dL (ref 8.4–10.5)
Chloride: 98 mEq/L (ref 96–112)
Creatinine, Ser: 0.74 mg/dL (ref 0.50–1.10)
Glucose, Bld: 90 mg/dL (ref 70–99)

## 2012-08-21 LAB — GLUCOSE, CAPILLARY: Glucose-Capillary: 78 mg/dL (ref 70–99)

## 2012-08-21 MED ORDER — POTASSIUM CHLORIDE CRYS ER 20 MEQ PO TBCR
40.0000 meq | EXTENDED_RELEASE_TABLET | Freq: Once | ORAL | Status: AC
  Administered 2012-08-21: 40 meq via ORAL

## 2012-08-21 NOTE — Progress Notes (Signed)
Subjective: Ms. Bolte was seen and examined by me this AM.  She is sitting on the side of bed holding her L elbow.  She denies CP/SOB but continues to have L elbow pain which appears increased from yesterday.    Objective: Vital signs in last 24 hours: Filed Vitals:   08/20/12 1500 08/20/12 2123 08/21/12 0629 08/21/12 1401  BP: 108/64 96/51 103/59 99/60  Pulse: 108 99 88 97  Temp: 97.5 F (36.4 C) 98.4 F (36.9 C) 97.9 F (36.6 C) 97.6 F (36.4 C)  TempSrc: Oral Oral Oral Oral  Resp: _0 Height:      Weight:   100.336 kg (221 lb 3.2 oz)   SpO2: 96% 97% 94% 99%   Weight change:   Intake/Output Summary (Last 24 hours) at 08/21/12 1716 Last data filed at 08/21/12 1542  Gross per 24 hour  Intake    580 ml  Output   1720 ml  Net  -1140 ml   General: sitting in bed in NAD Cardiac: RRR Pulm: clear to auscultation bilaterally Abd: soft, nontender, nondistended, BS present Ext: trace B/L lower extremity edema Neuro: alert and oriented X3  Lab Results: Basic Metabolic Panel:  Recent Labs Lab 08/20/12 0544 08/21/12 0530  NA 138 138  K 3.4* 3.3*  CL 96 98  CO2 32 30  GLUCOSE 97 90  BUN 29* 28*  CREATININE 0.83 0.74  CALCIUM 8.7 8.6   CBC:  Recent Labs Lab 08/19/12 0500 08/20/12 0544 08/21/12 0530  WBC 9.0 12.0* 13.7*  NEUTROABS 7.6  --  9.7*  HGB 10.0* 10.8* 10.4*  HCT 31.9* 33.3* 33.2*  MCV 76.3* 75.9* 76.5*  PLT 302 343 320   Cardiac Enzymes:  Recent Labs Lab 08/16/12 2211 08/17/12 0440 08/17/12 1031  TROPONINI <0.30 <0.30 <0.30   BNP:  Recent Labs Lab 08/16/12 1417  PROBNP 7892.0*   CBG:  Recent Labs Lab 08/20/12 1150 08/20/12 1622 08/20/12 2159 08/21/12 0626 08/21/12 1057 08/21/12 1641  GLUCAP 211* 203* 128* 78 143* 151*    Urinalysis:  Recent Labs Lab 08/16/12 1612  COLORURINE YELLOW  LABSPEC 1.012  PHURINE 5.0  GLUCOSEU NEGATIVE  HGBUR NEGATIVE  BILIRUBINUR SMALL*  KETONESUR NEGATIVE  PROTEINUR NEGATIVE    UROBILINOGEN 1.0  NITRITE NEGATIVE  LEUKOCYTESUR TRACE*    Micro Results: Recent Results (from the past 240 hour(s))  MRSA PCR SCREENING     Status: Abnormal   Collection Time    08/16/12  9:41 PM      Result Value Range Status   MRSA by PCR POSITIVE (*) NEGATIVE Final   Comment:            The GeneXpert MRSA Assay (FDA     approved for NASAL specimens     only), is one component of a     comprehensive MRSA colonization     surveillance program. It is not     intended to diagnose MRSA     infection nor to guide or     monitor treatment for     MRSA infections.     RESULT CALLED TO, READ BACK BY AND VERIFIED WITH:     LEONARD,M RN [id number] AT 0050 SKEEN,P   Studies/Results: Dg Elbow 2 Views Left  08/20/2012   CLINICAL DATA:  Severe elbow pain and limited range of motion. No known injury.  EXAM: LEFT ELBOW - 2 VIEW  COMPARISON:  08/16/2012  FINDINGS: No evidence of fracture or dislocation. An  elbow joint effusion is again seen. No signs of abnormal arthropathy identified. Degenerative spurring is seen at the olecranon process.  IMPRESSION: Persistent elbow joint effusion. No acute bone abnormality.   Electronically Signed   By: Earle Gell   On: 08/20/2012 12:34   Medications: I have reviewed the patient's current medications. Scheduled Meds: . antiseptic oral rinse  15 mL Mouth Rinse q12n4p  . chlorhexidine  15 mL Mouth Rinse BID  . Chlorhexidine Gluconate Cloth  6 each Topical Q0600  . collagenase   Topical Daily  . gabapentin  600 mg Oral TID  . insulin aspart  0-9 Units Subcutaneous TID WC  . mupirocin ointment  1 application Nasal BID  . potassium chloride  40 mEq Oral Daily  . rivaroxaban  20 mg Oral Q supper  . sodium chloride  3 mL Intravenous Q12H  . torsemide  100 mg Oral Daily   Continuous Infusions: . treprostinil (REMODULIN) infusion 36 ng/kg/min (08/21/12 1100)   PRN Meds:.sodium chloride, acetaminophen, ketoconazole, lidocaine, meperidine (DEMEROL)  injection, sodium chloride  Assessment/Plan: Ms.Orra A Dura is a 52 y.o. woman with cor pulmonale with severe pulmonary hypertension (PAP 95/49) on continuous Remodulin infusion, diastolic heart failure (EF 55% in 7/14), restrictive lung disease, OSA/OHS, chronic venous stasis, morbid obesity, mild mental retardation who presents today after an isolated episode of chest pain and tachycardia. We discussed this patient with Dr. Alva Garnet of PCCM given her significant and complicated cardiopulmonary issues.  #Pulmonary hypertension vs. CHF exacerbation - ProBNP elevated to 7000 (baseline ~3000), suggestion of pulmonary edema seen on CXR, some JVD + crackles heard at the lung bases on physical exam, concerning for CHF exacerbation. However patient denies worsening SOB, worsening edema in legs or abdomen, orthopnea, PND. Pulmonary embolism is on the differential, but symptoms and vital sign abnormalities not persistent, no unilateral calf pain or leg swelling. Also we would expect a more dramatic presentation for PE in a patient with such tenuous right heart function. Her Wells criteria=+1.5, low risk group for PE, 1.3-3% chance of PE. ?Pneumonia with persistent left basilar atelectasis vs. infiltrate on CXR, however no WBC, no fever, no cough or sputum production. Her heart failure is followed by Dr. Haroldine Laws here. Her pulmonary hypertension is followed by Dr. Gilles [facility]iquito at [facility].  Pt down 5.7L since admission. - Telemetry monitoring, has had no further Aflutter - Daily weights, strict Is/Os  - Cardiology consulted and recommendations appreciated - Continue po Torsemide 157m daily - Continue Remodulin infusion - pharmacy on board; patient's son will continue to bring in cartridges  #Chest pain - Resolved. Suspect musculoskeletal/costochondritis given exacerbation with movement when it was present. Pain was reproducible on exam with palpation over left 5th/6th ribs in the midclavicular area. Still, in this  patient with significant cardiac history it is important to at least rule out acute MI.  -Trops negative x 3  #Tachycardia - Paroxysmal episodes of tachycardia; she had one episode to ~150bpm at home, and one episode to 139 here on the floor, both of which resolved without intervention. Her heart rate is 90s-100s at baseline. Unclear etiology.    - Telemetry bed with frequent vital sign checks  - Xarelto for Aflutter per Dr. BHaroldine Laws #Hypotension - 80-90s/40-60s on admission. This appears to be around her baseline. Remodulin is a vasodilator and is known to cause hypotension.  - Telemetry bed with vital signs q4h   #OSA - Sleep-disordered breathing thought to be a driver of her cor pulmonale. On CPAP at  home and reports compliance.  - Continue CPAP qhs   #Diabetes - Stable. Last A1C 6.0% in 5/14. - Moderate SSI  - Continue gabapentin for peripheral neuropathy  - Holding metformin   #CKD, stage 3 - Baseline Cr seems to be around 1.0-1.2 with GFR 60-70, but with a significantly elevated Cr up to 4.69 during her last hospitalization thought 2/2 volume loss from diarrhea. This admission, Cr 1.82, which is up from 1.44 on 8/7. Will continue to monitor. Cr 0.86 today. -daily BMPs  #Skin ulcers - At right pretibial area (stage 2), sacrum (stage 1), right gluteal cleft (with eschar). Concern low for cellulitis given lack of pain, no erythema, wound without purulence, no history of fevers or chills.  - Wound care consulted and recommendations appreciated - Santyl to buttock wound daily; normal saline and soft silicone dressing for pretibial and R buttock ulcers twice weekly or as needed when soiled or loose.  #Left elbow pain - ROM limited by pain, no palpable effusion, pain localized to posterior elbow/ olecranon bursa. Possible olecranon bursitis.  Initial xray shows "mild prominence of the anterior fat pad which may indicate a small joint effusion"; however patient's pain is in the posterior  portion of her elbow. Occult fracture unlikely. Possibly gout but pain has not responded to Prednisone.  Repeat xray 08/17 shows persistent elbow joint effusion, no acute bony abnormalities.  Pt continues to have pain. - ortho consulted - Tylenol prn pain, pt also on Demerol q8h prn   #Rash - Diffuse, upper and lower extremities.  Thought to be a side effect of her Remodulin. Not particularly bothersome to patient.  - Continue home ketoconazole cream as needed.   #DVT PPX - Lovenox SQ  Dispo: Disposition is deferred at this time, awaiting improvement of current medical problems.  Anticipated discharge in approximately 1-2 day(s).   The patient does have a current PCP Luanne Bras, MD) and does need an [facility] hospital follow-up appointment after discharge.  The patient does not know have transportation limitations that hinder transportation to clinic appointments.  .Services Needed at time of discharge: Y = Yes, Blank = No PT:   OT:   RN:   Equipment:   Other:     LOS: 5 days   Duwaine Maxin, DO 08/21/2012, 5:16 PM

## 2012-08-21 NOTE — Progress Notes (Addendum)
Advanced Heart Failure Rounding Note   Subjective:    Has diuresed well. Weight appears to be down 10 pounds - though weights scattered. Still c/o L elbow pain and immobility despite steroids. Breathing better.    Objective:   Weight Range:  Vital Signs:   Temp:  [97.5 F (36.4 C)-98.4 F (36.9 C)] 97.9 F (36.6 C) (08/18 0629) Pulse Rate:  [88-108] 88 (08/18 0629) Resp:  [18-20] 18 (08/18 0629) BP: (96-108)/(51-64) 103/59 mmHg (08/18 0629) SpO2:  [94 %-97 %] 94 % (08/18 0629) Weight:  [100.336 kg (221 lb 3.2 oz)] 100.336 kg (221 lb 3.2 oz) (08/18 0629) Last BM Date: 08/19/12  Weight change: Filed Weights   08/18/12 0502 08/19/12 0508 08/21/12 0629  Weight: 104.872 kg (231 lb 3.2 oz) 104.055 kg (229 lb 6.4 oz) 100.336 kg (221 lb 3.2 oz)    Intake/Output:   Intake/Output Summary (Last 24 hours) at 08/21/12 0812 Last data filed at 08/21/12 0806  Gross per 24 hour  Intake   1180 ml  Output   2100 ml  Net   -920 ml     Physical Exam: General:  Chronically ill appearing. No resp difficulty HEENT: normal Neck: supple. JVP 7-8 . Carotids 2+ bilat; no bruits. No lymphadenopathy or thryomegaly appreciated. Cor: PMI nondisplaced. Regular rate & rhythm. +RV lift. 2/6 TR. Loud P2 Lungs: clear Abdomen: obese, soft, nontender, nondistended. No hepatosplenomegaly. No bruits or masses. Good bowel sounds. Extremities: no cyanosis, clubbing, rash, 1+ edema; rash on forearms and calves +LE ulcer. Unable to bend left elbow. Sore to palpation on underside Neuro: alert & orientedx3, cranial nerves grossly intact. moves all 4 extremities w/o difficulty. Affect pleasant  Telemetry: SR  Labs: Basic Metabolic Panel:  Recent Labs Lab 08/17/12 0440 08/18/12 0640 08/19/12 0500 08/20/12 0544 08/21/12 0530  NA 137 136 137 138 138  K 3.5 3.7 3.2* 3.4* 3.3*  CL 96 95* 95* 96 98  CO2 28 29 32 32 30  GLUCOSE 91 104* 128* 97 90  BUN 51* 42* 31* 29* 28*  CREATININE 1.55* 1.03 0.81 0.83  0.74  CALCIUM 8.5 8.4 8.2* 8.7 8.6    Liver Function Tests: No results found for this basename: AST, ALT, ALKPHOS, BILITOT, PROT, ALBUMIN,  in the last 168 hours No results found for this basename: LIPASE, AMYLASE,  in the last 168 hours No results found for this basename: AMMONIA,  in the last 168 hours  CBC:  Recent Labs Lab 08/16/12 2211 08/18/12 0640 08/19/12 0500 08/20/12 0544 08/21/12 0530  WBC 9.7 9.4 9.0 12.0* PENDING  NEUTROABS  --  7.2 7.6  --  PENDING  HGB 10.6* 9.9* 10.0* 10.8* 10.4*  HCT 32.6* 31.2* 31.9* 33.3* 33.2*  MCV 76.3* 76.8* 76.3* 75.9* 76.5*  PLT 284 321 302 343 PENDING    Cardiac Enzymes:  Recent Labs Lab 08/16/12 1544 08/16/12 2211 08/17/12 0440 08/17/12 1031  TROPONINI <0.30 <0.30 <0.30 <0.30    BNP: BNP (last 3 results)  Recent Labs  07/12/12 1645 08/06/12 1328 08/16/12 1417  PROBNP 22696.0* 3626.0* 7892.0*     Imaging: Dg Elbow 2 Views Left  08/20/2012   CLINICAL DATA:  Severe elbow pain and limited range of motion. No known injury.  EXAM: LEFT ELBOW - 2 VIEW  COMPARISON:  08/16/2012  FINDINGS: No evidence of fracture or dislocation. An elbow joint effusion is again seen. No signs of abnormal arthropathy identified. Degenerative spurring is seen at the olecranon process.  IMPRESSION: Persistent elbow joint effusion.  No acute bone abnormality.   Electronically Signed   By: Earle Gell   On: 08/20/2012 12:34     Medications:     Scheduled Medications: . antiseptic oral rinse  15 mL Mouth Rinse q12n4p  . chlorhexidine  15 mL Mouth Rinse BID  . Chlorhexidine Gluconate Cloth  6 each Topical Q0600  . collagenase   Topical Daily  . gabapentin  600 mg Oral TID  . insulin aspart  0-9 Units Subcutaneous TID WC  . mupirocin ointment  1 application Nasal BID  . potassium chloride  40 mEq Oral Daily  . rivaroxaban  20 mg Oral Q supper  . sodium chloride  3 mL Intravenous Q12H  . torsemide  100 mg Oral Daily    Infusions: .  treprostinil (REMODULIN) infusion      PRN Medications: sodium chloride, acetaminophen, ketoconazole, lidocaine, meperidine (DEMEROL) injection, sodium chloride   Assessment:   1. Chest pain likely due to #2 2. AFL with RVR now in sinus rhythm  3. Severe PAH with cor pulmonale  4. Acute on chronic diastolic HF  5. Cognitive impairment  6. Rash 7. Hypokalemia 8. L elbow pain and immobility  Plan/Discussion:    Volume status much improved. Breathing well. No further AFL.   Rash is improved with prednisone. This is common rash with Remodulin. It can transform into leukocytoclastic vasculitis but appears stable for now.   Main issue to address now is her L elbow. I initially thought it was gout but no real improvement with steroids so that is unlikely. I will ask ortho to see.  Ambulate with PT. Hopefully we can get her home tomorrow. Appreciate IM care. Continue Xarelto for anticoagulation. Watch sugars on prednisone. Will supp K+.  Daniel Bensimhon,MD 8:12 AM   D/w Dr. Ninfa Linden in Ortho - he will see today.  Daniel Bensimhon,MD 9:45 AM

## 2012-08-21 NOTE — Progress Notes (Signed)
Internal Medicine Attending  Date: 08/21/2012  Patient name: [name] Medical record number: [id number] Date of birth: July 31, 1960 Age: 52 y.o. Gender: female  I saw and evaluated the patient. I reviewed the resident's note by Dr. Redmond Pulling and I agree with the resident's findings and plans as documented in her note.

## 2012-08-21 NOTE — Progress Notes (Signed)
[name], female   DOB: [birth year], 52 y.o.   MRN: [id number] Dr. Haroldine Laws asked if I could stop by and evaluate [name]'s left elbow.  It has be hurting her now for over a week.  She dies any injury, but does no want to move it much due to her pain.  When I examine her elbow, she has significant pain on attempts at motion and even cries out.  There is swelling when compared to her other elbow.  There is slight fullness to the elbow joint itself suggesting an effusion.  She has pain laterally especially in the joint space between the radial head, lateral epicondyl, and oelcranon.  Elbow xrays show good alignment with no fracture, but there is evidence of a mild effusion.  I'll stop by tomorrow afternoon and try an intra-articular injection and possibly an injection over the olecranon as well.

## 2012-08-22 ENCOUNTER — Ambulatory Visit: Payer: Self-pay | Admitting: Internal Medicine

## 2012-08-22 ENCOUNTER — Encounter: Payer: Self-pay | Admitting: Internal Medicine

## 2012-08-22 DIAGNOSIS — I5033 Acute on chronic diastolic (congestive) heart failure: Secondary | ICD-10-CM | POA: Diagnosis present

## 2012-08-22 LAB — GLUCOSE, CAPILLARY
Glucose-Capillary: 85 mg/dL (ref 70–99)
Glucose-Capillary: 137 mg/dL — ABNORMAL HIGH (ref 70–99)

## 2012-08-22 LAB — BASIC METABOLIC PANEL
BUN: 23 mg/dL (ref 6–23)
Calcium: 8.6 mg/dL (ref 8.4–10.5)
Creatinine, Ser: 0.66 mg/dL (ref 0.50–1.10)
GFR calc Af Amer: >90 mL/min (ref >90)
GFR calc non Af Amer: >90 mL/min (ref >90)
Potassium: 3.1 mEq/L — ABNORMAL LOW (ref 3.5–5.1)

## 2012-08-22 LAB — CBC WITH DIFFERENTIAL/PLATELET
Band Neutrophils: 0 % (ref 0–10)
Basophils Absolute: 0 10*3/uL (ref 0.0–0.1)
Basophils Relative: 0 % (ref 0–1)
HCT: 35.7 % — ABNORMAL LOW (ref 36.0–46.0)
Hemoglobin: 11.2 g/dL — ABNORMAL LOW (ref 12.0–15.0)
Lymphocytes Relative: 19 % (ref 12–46)
Lymphs Abs: 2.2 10*3/uL (ref 0.7–4.0)
MCH: 24.2 pg — ABNORMAL LOW (ref 26.0–34.0)
MCHC: 31.4 g/dL (ref 30.0–36.0)
Myelocytes: 0 %
Promyelocytes Absolute: 0 %

## 2012-08-22 MED ORDER — POTASSIUM CHLORIDE CRYS ER 20 MEQ PO TBCR
40.0000 meq | EXTENDED_RELEASE_TABLET | Freq: Once | ORAL | Status: AC
  Administered 2012-08-22: 40 meq via ORAL

## 2012-08-22 MED ORDER — POTASSIUM CHLORIDE ER 10 MEQ PO TBCR
40.0000 meq | EXTENDED_RELEASE_TABLET | Freq: Two times a day (BID) | ORAL | Status: DC
  Administered 2012-08-22 – 2012-08-23 (×3): 40 meq via ORAL
  Filled 2012-08-22 (×4): qty 4

## 2012-08-22 NOTE — Clinical Documentation Improvement (Signed)
THIS DOCUMENT IS NOT A PERMANENT PART OF THE MEDICAL RECORD  Please update your documentation with the medical record to reflect your response to this query. If you need help knowing how to do this please call [phone].  08/22/12  Dr. Duwaine Maxin,  In a better effort to capture your patient's severity of illness, reflect appropriate length of stay and utilization of resources, a review of the patient medical record has revealed the following information:   - Known CKD, Stage 3 per H&P   Bun/Cr/ GFR      (white female)  55/1.82/31     (08/16/12 on admission) 23/0.66/>90   (08/22/12)   Based on your clinical judgment, please document in the progress notes and discharge summary if a condition below provides greater specificity regarding the patient's renal function this admission:   - Acute Renal Failure on Stage 3 CKD, resolved   - Other Condition   - Unable to Clinically Determine   In responding to this query please exercise your independent judgment.    The fact that a query is asked, does not imply that any particular answer is desired or expected.   Reviewed: 09/17/12 - query never addressed.  Vilinda Flake RN  Thank You,  Faith Conte  RN BSN CCDS Certified Clinical Documentation Specialist: [place]

## 2012-08-22 NOTE — Evaluation (Signed)
Physical Therapy Evaluation Patient Details Name: [name] MRN: [id number] DOB: 1960/02/28 Today's Date: 08/22/2012 Time: 9024-0973 PT Time Calculation (min): 30 min  PT Assessment / Plan / Recommendation History of Present Illness  [name] is a 52 y.o. woman with cor pulmonale and severe pulmonary hypertension (PAP 95/49) on continuous Remodulin infusion, diastolic heart failure (EF 55% in 7/14), restrictive lung disease, OSA/OHS, chronic venous stasis, morbid obesity, mild mental retardation who presents today with an episode of chest pain and tachycardia.  Clinical Impression  Pt admitted with the above. Pt currently with functional limitations due to the deficits listed below (see PT Problem List). Pt refused to ambulate in hallway however agreeable to work in room.  Pt reported suppose to start pulmonary rehab at [facility].  Pt now has swelling in right elbow limiting overall ROM.  Pt also reporting having difficulty to get to Outpatient facility due to son starting school. Therefore will recommend HHPT. Pt will benefit from skilled PT to increase their independence and safety with mobility to allow discharge to the venue listed below.      PT Assessment  Patient needs continued PT services    Follow Up Recommendations  Home health PT;Supervision/Assistance - 24 hour    Equipment Recommendations  None recommended by PT    Frequency Min 3X/week    Precautions / Restrictions Precautions Precautions: None Restrictions Weight Bearing Restrictions: No   Pertinent Vitals/Pain C/o right elbow pain but does not rate      Mobility  Bed Mobility Bed Mobility: Supine to Sit Supine to Sit: 4: Min assist Details for Bed Mobility Assistance: (A) with trunk OOB with cues for technique Transfers Transfers: Sit to Stand;Stand to Sit Sit to Stand: 4: Min guard;From bed Stand to Sit: 4: Min guard;To chair/3-in-1 Details for Transfer Assistance: Minguard for safety with cues  for hand placement Ambulation/Gait Ambulation/Gait Assistance: 4: Min guard Ambulation Distance (Feet): 20 Feet Assistive device: None Ambulation/Gait Assistance Details: Minguard for safety  Gait Pattern: Step-through pattern;Decreased stride length;Shuffle;Trunk flexed Gait velocity: decreased Stairs: No    Exercises     PT Diagnosis: Difficulty walking;Generalized weakness;Acute pain  PT Problem List: Decreased strength;Decreased range of motion;Decreased activity tolerance;Decreased mobility;Decreased knowledge of use of DME;Pain;Cardiopulmonary status limiting activity PT Treatment Interventions: DME instruction;Gait training;Stair training;Functional mobility training;Therapeutic activities;Therapeutic exercise;Patient/family education     PT Goals(Current goals can be found in the care plan section) Acute Rehab PT Goals Patient Stated Goal: I'll stay in the room and walk. PT Goal Formulation: With patient Time For Goal Achievement: 08/29/12 Potential to Achieve Goals: Good  Visit Information  Last PT Received On: 08/22/12 Assistance Needed: +1 History of Present Illness: [name] is a 52 y.o. woman with cor pulmonale and severe pulmonary hypertension (PAP 95/49) on continuous Remodulin infusion, diastolic heart failure (EF 55% in 7/14), restrictive lung disease, OSA/OHS, chronic venous stasis, morbid obesity, mild mental retardation who presents today with an episode of chest pain and tachycardia.       Prior [name] expects to be discharged to:: Private residence Living Arrangements: Children (son) Available Help at Discharge: Family;Available PRN/intermittently Type of Home: House Home Access: Stairs to enter [employer] of Steps: 3 Entrance Stairs-Rails: Left Home Layout: One level Home Equipment: Cane - single point Prior Function Level of Independence: Independent with assistive device(s) (with  RW) Communication Communication: No difficulties Dominant Hand: Right    Cognition  Cognition Arousal/Alertness: Awake/alert Behavior During Therapy: [facility]  for tasks assessed/performed Overall Cognitive Status: Within Functional Limits for tasks assessed    Extremity/Trunk Assessment Upper Extremity Assessment Upper Extremity Assessment: RUE deficits/detail RUE Deficits / Details: Limited elbow extension AROM. Pt keeps elbow in 90 degrees flexion Lower Extremity Assessment Lower Extremity Assessment: Overall WFL for tasks assessed   Balance    End of Session PT - End of Session Equipment Utilized During Treatment: Gait belt Activity Tolerance: Patient limited by pain Patient left: in chair;with call bell/phone within reach Nurse Communication: Mobility status  GP     [name] 08/22/2012, 1:07 PM  Antoine Poche, [place] DPT [phone]

## 2012-08-22 NOTE — Progress Notes (Signed)
Subjective: [name] was seen and examined by me this AM.  She is laying in bed and has already eaten breakfast.  She still has complaint of left elbow pain.  Later this morning during team rounding [name] complained of intermittent, pleuritic CP.  She denied SOB. Stat EKG was performed and was similar to prior EKG and without evidence of ischemia.  Troponins will be followed.  Stopped by around lunch to check on [name] and she was not experiencing CP at that time.  She had lunch tray and was getting ready to eat.  Objective: Vital signs in last 24 hours: Filed Vitals:   08/21/12 2119 08/21/12 2334 08/22/12 0623 08/22/12 1450  BP: 97/54  102/61 109/70  Pulse: 100  94 111  Temp: 98.2 F (36.8 C)  98.7 F (37.1 C) 98.7 F (37.1 C)  TempSrc: Oral  Oral Oral  Resp: _0 Height:      Weight:   99.61 kg (219 lb 9.6 oz)   SpO2: 94% 94% 95% 95%   Weight change: -0.726 kg (-1 lb 9.6 oz)  Intake/Output Summary (Last 24 hours) at 08/22/12 1616 Last data filed at 08/22/12 1443  Gross per 24 hour  Intake    600 ml  Output   3151 ml  Net  -2551 ml   General: laying in bed in NAD Cardiac: RRR Pulm: clear to auscultation bilaterally Abd: soft, nontender, nondistended, BS present Ext: trace B/L lower extremity edema Neuro: alert and oriented X3  Lab Results: Basic Metabolic Panel:  Recent Labs Lab 08/21/12 0530 08/22/12 0500  NA 138 146*  K 3.3* 3.1*  CL 98 102  CO2 30 35*  GLUCOSE 90 92  BUN 28* 23  CREATININE 0.74 0.66  CALCIUM 8.6 8.6   CBC:  Recent Labs Lab 08/21/12 0530 08/22/12 0500  WBC 13.7* 11.6*  NEUTROABS 9.7* 9.1*  HGB 10.4* 11.2*  HCT 33.2* 35.7*  MCV 76.5* 77.1*  PLT 320 356   Cardiac Enzymes:  Recent Labs Lab 08/16/12 2211 08/17/12 0440 08/17/12 1031  TROPONINI <0.30 <0.30 <0.30   BNP:  Recent Labs Lab 08/16/12 1417  PROBNP 7892.0*   CBG:  Recent Labs Lab 08/20/12 2159 08/21/12 0626 08/21/12 1057 08/21/12 1641  08/21/12 2115 08/22/12 1127  GLUCAP 128* 78 143* 151* 139* 113*    Urinalysis:  Recent Labs Lab 08/16/12 1612  COLORURINE YELLOW  LABSPEC 1.012  PHURINE 5.0  GLUCOSEU NEGATIVE  HGBUR NEGATIVE  BILIRUBINUR SMALL*  KETONESUR NEGATIVE  PROTEINUR NEGATIVE  UROBILINOGEN 1.0  NITRITE NEGATIVE  LEUKOCYTESUR TRACE*    Micro Results: Recent Results (from the past 240 hour(s))  MRSA PCR SCREENING     Status: Abnormal   Collection Time    08/16/12  9:41 PM      Result Value Range Status   MRSA by PCR POSITIVE (*) NEGATIVE Final   Comment:            The GeneXpert MRSA Assay (FDA     approved for NASAL specimens     only), is one component of a     comprehensive MRSA colonization     surveillance program. It is not     intended to diagnose MRSA     infection nor to guide or     monitor treatment for     MRSA infections.     RESULT CALLED TO, READ BACK BY AND VERIFIED WITH:     LEONARD,M RN [phone] AT  0050 SKEEN,P   Studies/Results: No results found. Medications: I have reviewed the patient's current medications. Scheduled Meds: . antiseptic oral rinse  15 mL Mouth Rinse q12n4p  . collagenase   Topical Daily  . gabapentin  600 mg Oral TID  . insulin aspart  0-9 Units Subcutaneous TID WC  . potassium chloride  40 mEq Oral BID  . rivaroxaban  20 mg Oral Q supper  . sodium chloride  3 mL Intravenous Q12H  . torsemide  100 mg Oral Daily   Continuous Infusions: . treprostinil (REMODULIN) infusion 36 ng/kg/min (08/21/12 1100)   PRN Meds:.sodium chloride, acetaminophen, ketoconazole, lidocaine, meperidine (DEMEROL) injection, sodium chloride  Assessment/Plan: [name] is a 52 y.o. woman with cor pulmonale with severe pulmonary hypertension (PAP 95/49) on continuous Remodulin infusion, diastolic heart failure (EF 55% in 7/14), restrictive lung disease, OSA/OHS, chronic venous stasis, morbid obesity, mild mental retardation who presents today after an isolated episode  of chest pain and tachycardia. We discussed this patient with Dr. Alva Garnet of PCCM given her significant and complicated cardiopulmonary issues.  #Pulmonary hypertension vs. CHF exacerbation - ProBNP elevated to 7000 (baseline ~3000), suggestion of pulmonary edema seen on CXR, some JVD + crackles heard at the lung bases on physical exam, concerning for CHF exacerbation. However patient denies worsening SOB, worsening edema in legs or abdomen, orthopnea, PND. Pulmonary embolism is on the differential, but symptoms and vital sign abnormalities not persistent, no unilateral calf pain or leg swelling. Also we would expect a more dramatic presentation for PE in a patient with such tenuous right heart function. Her Wells criteria=+1.5, low risk group for PE, 1.3-3% chance of PE. ?Pneumonia with persistent left basilar atelectasis vs. infiltrate on CXR, however no WBC, no fever, no cough or sputum production. Her heart failure is followed by Dr. Haroldine Laws here. Her pulmonary hypertension is followed by Dr. Gilles Chiquito at [facility].  Pt down 8.2L since admission. - Telemetry monitoring, has had no further Aflutter - Daily weights, strict Is/Os  - Cardiology consulted and recommendations appreciated - Continue po Torsemide 133m daily - Continue Remodulin infusion - pharmacy on board; patient's son will continue to bring in cartridges  #Chest pain - Patient had prior CP that was presumed to be musculoskeletal/costochondritis given exacerbation with movement, reproducibility and troponins negative x 3.  CP had resolved until this morning when she described pleuritic, intermittent CP.  She denied SOB.  Stat EKG was unchanged from prior and without evidence of ischemia.  PE is less likely as patient is on Xarelto. - troponins now, then q6h x 3 - continue telemetry monitoring  #Tachycardia - Paroxysmal episodes of tachycardia; she had one episode to ~150bpm at home, and one episode to 139 here on the floor, both of which  resolved without intervention. Her heart rate is 90s-100s at baseline. Unclear etiology.    - Telemetry bed with frequent vital sign checks  - Xarelto for Aflutter per Dr. BHaroldine Laws #Hypotension - 80-90s/40-60s on admission. This appears to be around her baseline. Remodulin is a vasodilator and is known to cause hypotension.  - Telemetry bed with vital signs q4h   #OSA - Sleep-disordered breathing thought to be a driver of her cor pulmonale. On CPAP at home and reports compliance.  - Continue CPAP qhs   #Diabetes - Stable. Last A1C 6.0% in 5/14. - Moderate SSI  - Continue gabapentin for peripheral neuropathy  - Holding metformin   #CKD, stage 3 - Baseline Cr seems to be around 1.0-1.2  with GFR 60-70, but with a significantly elevated Cr up to 4.69 during her last hospitalization thought 2/2 volume loss from diarrhea. This admission, Cr 1.82, which is up from 1.44 on 8/7. Will continue to monitor. Cr 0.66 today. -daily BMPs  #Skin ulcers - At right pretibial area (stage 2), sacrum (stage 1), right gluteal cleft (with eschar). Concern low for cellulitis given lack of pain, no erythema, wound without purulence, no history of fevers or chills.  - Wound care consulted and recommendations appreciated - Santyl to buttock wound daily; normal saline and soft silicone dressing for pretibial and R buttock ulcers twice weekly or as needed when soiled or loose.  #Left elbow pain - ROM limited by pain, no palpable effusion, pain localized to posterior elbow/ olecranon bursa. Possible olecranon bursitis.  Initial xray shows "mild prominence of the anterior fat pad which may indicate a small joint effusion"; however patient's pain is in the posterior portion of her elbow. Occult fracture unlikely. Possibly gout but pain has not responded to Prednisone.  Repeat xray 08/17 shows persistent elbow joint effusion, no acute bony abnormalities.  Pt continues to have pain. - intra-articular injection and injection  over the olecranon performed by ortho today - Tylenol prn pain, pt also on Demerol q8h prn   #Rash - Diffuse, upper and lower extremities.  Thought to be a side effect of her Remodulin. Not particularly bothersome to patient.  - Continue home ketoconazole cream as needed.   #DVT PPX - Lovenox SQ  Dispo: Disposition is deferred at this time, awaiting improvement of current medical problems.  Anticipated discharge in approximately 1-2 day(s).   The patient does have a current PCP Luanne Bras, MD) and does need an [facility] hospital follow-up appointment after discharge.  The patient does not know have transportation limitations that hinder transportation to clinic appointments.  .Services Needed at time of discharge: Y = Yes, Blank = No PT:   OT:   RN:   Equipment:   Other:     LOS: 6 days   Duwaine Maxin, DO 08/22/2012, 4:16 PM

## 2012-08-22 NOTE — Progress Notes (Signed)
08/22/12 1435 In to speak with pt. about community resources, as pt. stated she wanted a personal care assistant.  TC to [place], with [facility] to give referral for services.  Rosendo Gros stated she would come and speak with pt. today.  Anticipate dc tomorrow.  Llana Aliment, RN, BSN NCM [phone]

## 2012-08-22 NOTE — Progress Notes (Signed)
Pt initially c/o pain with Patient pointing to her epigastric area. 5 on the scale of 1-10. Repositioned patient and stated pain was gone. VS taken .  Pt stated " i have hx of GERD" Monitored patient closely.

## 2012-08-22 NOTE — Progress Notes (Signed)
Advanced Heart Failure Rounding Note   Subjective:    Stable overnight. Weight down 2 lbs, 24 hr I/O -1.6 liters. Denies any CP, orthopnea or SOB. Still c/o L elbow pain and immobility despite steroids, ortho consulted and they may try an intra-articular injection today.    No AFL.    Objective:   Weight Range:  Vital Signs:   Temp:  [97.6 F (36.4 C)-98.7 F (37.1 C)] 98.7 F (37.1 C) (08/19 0623) Pulse Rate:  [94-100] 94 (08/19 0623) Resp:  [16-18] 18 (08/19 0623) BP: (97-102)/(54-61) 102/61 mmHg (08/19 0623) SpO2:  [94 %-99 %] 95 % (08/19 0623) Weight:  [219 lb 9.6 oz (99.61 kg)] 219 lb 9.6 oz (99.61 kg) (08/19 0623) Last BM Date: 08/21/12  Weight change: Filed Weights   08/19/12 0508 08/21/12 0629 08/22/12 0623  Weight: 229 lb 6.4 oz (104.055 kg) 221 lb 3.2 oz (100.336 kg) 219 lb 9.6 oz (99.61 kg)    Intake/Output:   Intake/Output Summary (Last 24 hours) at 08/22/12 0828 Last data filed at 08/22/12 0700  Gross per 24 hour  Intake    460 ml  Output   2320 ml  Net  -1860 ml     Physical Exam: General:  Chronically ill appearing. No resp difficulty HEENT: normal Neck: supple. JVP 7-8 . Carotids 2+ bilat; no bruits. No lymphadenopathy or thryomegaly appreciated. Cor: PMI nondisplaced. Regular rate & rhythm. +RV lift. 2/6 TR. Loud P2 Lungs: clear Abdomen: obese, soft, nontender, nondistended. No hepatosplenomegaly. No bruits or masses. Good bowel sounds. Extremities: no cyanosis, clubbing, rash, 1+ edema; rash on forearms and calves +LE ulcer. Unable to bend left elbow. Sore to palpation on underside Neuro: alert & orientedx3, cranial nerves grossly intact. moves all 4 extremities w/o difficulty. Affect pleasant  Telemetry: SR  Labs: Basic Metabolic Panel:  Recent Labs Lab 08/18/12 0640 08/19/12 0500 08/20/12 0544 08/21/12 0530 08/22/12 0500  NA 136 137 138 138 146*  K 3.7 3.2* 3.4* 3.3* 3.1*  CL 95* 95* 96 98 102  CO2 29 32 32 30 35*  GLUCOSE 104*  128* 97 90 92  BUN 42* 31* 29* 28* 23  CREATININE 1.03 0.81 0.83 0.74 0.66  CALCIUM 8.4 8.2* 8.7 8.6 8.6    Liver Function Tests: No results found for this basename: AST, ALT, ALKPHOS, BILITOT, PROT, ALBUMIN,  in the last 168 hours No results found for this basename: LIPASE, AMYLASE,  in the last 168 hours No results found for this basename: AMMONIA,  in the last 168 hours  CBC:  Recent Labs Lab 08/18/12 0640 08/19/12 0500 08/20/12 0544 08/21/12 0530 08/22/12 0500  WBC 9.4 9.0 12.0* 13.7* 11.6*  NEUTROABS 7.2 7.6  --  9.7* 9.1*  HGB 9.9* 10.0* 10.8* 10.4* 11.2*  HCT 31.2* 31.9* 33.3* 33.2* 35.7*  MCV 76.8* 76.3* 75.9* 76.5* 77.1*  PLT 321 302 343 320 356    Cardiac Enzymes:  Recent Labs Lab 08/16/12 1544 08/16/12 2211 08/17/12 0440 08/17/12 1031  TROPONINI <0.30 <0.30 <0.30 <0.30    BNP: BNP (last 3 results)  Recent Labs  07/12/12 1645 08/06/12 1328 08/16/12 1417  PROBNP 22696.0* 3626.0* 7892.0*     Imaging: Dg Elbow 2 Views Left  08/20/2012   CLINICAL DATA:  Severe elbow pain and limited range of motion. No known injury.  EXAM: LEFT ELBOW - 2 VIEW  COMPARISON:  08/16/2012  FINDINGS: No evidence of fracture or dislocation. An elbow joint effusion is again seen. No signs of abnormal arthropathy identified.  Degenerative spurring is seen at the olecranon process.  IMPRESSION: Persistent elbow joint effusion. No acute bone abnormality.   Electronically Signed   By: Earle Gell   On: 08/20/2012 12:34     Medications:     Scheduled Medications: . antiseptic oral rinse  15 mL Mouth Rinse q12n4p  . collagenase   Topical Daily  . gabapentin  600 mg Oral TID  . insulin aspart  0-9 Units Subcutaneous TID WC  . potassium chloride  40 mEq Oral Daily  . rivaroxaban  20 mg Oral Q supper  . sodium chloride  3 mL Intravenous Q12H  . torsemide  100 mg Oral Daily    Infusions: . treprostinil (REMODULIN) infusion 36 ng/kg/min (08/21/12 1100)    PRN  Medications: sodium chloride, acetaminophen, ketoconazole, lidocaine, meperidine (DEMEROL) injection, sodium chloride   Assessment:   1. Chest pain likely due to #2 2. AFL with RVR now in sinus rhythm  3. Severe PAH with cor pulmonale  4. Acute on chronic diastolic HF  5. Cognitive impairment  6. Rash 7. Hypokalemia 8. L elbow pain and immobility  Plan/Discussion:    Weight down another 2 lbs and Cr stable. Will continue demadex at 100 mg daily. Na+ elevated not sure if accurate and will continue to follow. K+ 3.1 will supplement today and increase daily dose.   Rash continues to improve. This is common rash with Remodulin. It can transform into leukocytoclastic vasculitis but appears stable for now.   Still complaining of L elbow pain. Ortho consulted and coming by today to try intra-articular injection.   PT to ambulate with patient. HF standpoint and PAH she is doing great. Goal to go home tomorrow if elbow pain improves. Appreciate IM care. Continue Xarelto for anticoagulation.   Junie Bame B,NP-C 8:28 AM  Patient seen and examined with Junie Bame, NP. We discussed all aspects of the encounter. I agree with the assessment and plan as stated above.   Much improved from cardiac perspective. Volume status looks good. She appears to have mild hypernatremia and need to be careful we aren't restricting her access to water too much. Agree with supplementing her K.  Her elbow is concerning, it is quite warm and painful. I suspect gout but she did not have much response to steroids. I wonder if dose was insufficient or there is another process going on. May be worth doing a diagnostic tap to exclude infection. Ortho to see.   Once elbow pain resolved she is stable for d/c. Continue Remodulin and Xarelto.  Daivik Overley,MD 8:54 AM

## 2012-08-22 NOTE — Progress Notes (Signed)
Pt. c/o chest pain radiating from under lt. breast to under rt. breast constant pain that just all of a sudden came on. md made aware troponin's are cycling and EKG ordered.

## 2012-08-22 NOTE — Progress Notes (Signed)
Internal Medicine Attending  Date: 08/22/2012  Patient name: [name] Medical record number: [id number] Date of birth: 11/10/1960 Age: 52 y.o. Gender: female  I saw and evaluated the patient, and discussed her care on a.m. rounds with house staff. I reviewed the resident's note by Dr. Redmond Pulling and I agree with the resident's findings and plans as documented in her note.

## 2012-08-22 NOTE — Progress Notes (Signed)
[name], female   DOB: 1960-12-01, 52 y.o.   MRN: [id number] Ms. Stanco's left elbow pain is really unchanged.  She crys out in pain when I barely touch her.  Some of this seems to be out of well out of what one would expect.  There is mild swelling, but no redness and she is reluctant to move is for me.  I did clean the elbow with betadine and alcohol and then placed an injection of 1 cc of 1% plain lidocaine mixed with 1 cc of steroid into the left elbow joint and then the same injection mixture into the olecranon area.  Hopefully this will help her.

## 2012-08-23 LAB — CBC WITH DIFFERENTIAL/PLATELET
Basophils Absolute: 0 10*3/uL (ref 0.0–0.1)
HCT: 35.9 % — ABNORMAL LOW (ref 36.0–46.0)
Lymphs Abs: 1.3 10*3/uL (ref 0.7–4.0)
MCH: 24.3 pg — ABNORMAL LOW (ref 26.0–34.0)
MCHC: 31.8 g/dL (ref 30.0–36.0)
MCV: 76.5 fL — ABNORMAL LOW (ref 78.0–100.0)
Monocytes Absolute: 0.6 10*3/uL (ref 0.1–1.0)
Monocytes Relative: 5 % (ref 3–12)
Neutro Abs: 9.3 10*3/uL — ABNORMAL HIGH (ref 1.7–7.7)
Platelets: 332 10*3/uL (ref 150–400)
RDW: 20.3 % — ABNORMAL HIGH (ref 11.5–15.5)
WBC: 11.2 10*3/uL — ABNORMAL HIGH (ref 4.0–10.5)

## 2012-08-23 LAB — GLUCOSE, CAPILLARY
Glucose-Capillary: 128 mg/dL — ABNORMAL HIGH (ref 70–99)
Glucose-Capillary: 123 mg/dL — ABNORMAL HIGH (ref 70–99)

## 2012-08-23 LAB — BASIC METABOLIC PANEL
BUN: 19 mg/dL (ref 6–23)
Calcium: 9 mg/dL (ref 8.4–10.5)
Chloride: 100 mEq/L (ref 96–112)
Creatinine, Ser: 0.65 mg/dL (ref 0.50–1.10)
GFR calc Af Amer: >90 mL/min (ref >90)

## 2012-08-23 LAB — TROPONIN I: Troponin I: <0.3 ng/mL (ref <0.30)

## 2012-08-23 MED ORDER — COLLAGENASE 250 UNIT/GM EX OINT: TOPICAL_OINTMENT | Freq: Every day | CUTANEOUS | Status: DC

## 2012-08-23 MED ORDER — RIVAROXABAN 20 MG PO TABS: 20.0000 mg | ORAL_TABLET | Freq: Every day | ORAL | Status: DC

## 2012-08-23 NOTE — Progress Notes (Signed)
Advanced Heart Failure Rounding Note   Recurrent CP yesterday. No resolved. CE negative. No AFL seen.  Breathing fine. L elbow improved after injection.   Objective:   Weight Range:  Vital Signs:   Temp:  [98 F (36.7 C)-98.7 F (37.1 C)] 98.3 F (36.8 C) (08/20 0634) Pulse Rate:  [91-111] 92 (08/20 0634) Resp:  [18-19] 19 (08/20 0634) BP: (99-112)/(61-70) 112/68 mmHg (08/20 0634) SpO2:  [95 %-99 %] 99 % (08/20 0634) Weight:  [97.841 kg (215 lb 11.2 oz)] 97.841 kg (215 lb 11.2 oz) (08/20 0634) Last BM Date: 08/22/12  Weight change: Filed Weights   08/21/12 0629 08/22/12 0623 08/23/12 0634  Weight: 100.336 kg (221 lb 3.2 oz) 99.61 kg (219 lb 9.6 oz) 97.841 kg (215 lb 11.2 oz)    Intake/Output:   Intake/Output Summary (Last 24 hours) at 08/23/12 0929 Last data filed at 08/23/12 0900  Gross per 24 hour  Intake   1180 ml  Output   2652 ml  Net  -1472 ml     Physical Exam: General:  Chronically ill appearing. No resp difficulty HEENT: normal Neck: supple. JVP 7-8 . Carotids 2+ bilat; no bruits. No lymphadenopathy or thryomegaly appreciated. Cor: PMI nondisplaced. Regular rate & rhythm. +RV lift. 2/6 TR. Loud P2 Lungs: clear Abdomen: obese, soft, nontender, nondistended. No hepatosplenomegaly. No bruits or masses. Good bowel sounds. Extremities: no cyanosis, clubbing, rash, 1+ edema; rash on forearms and calves +LE ulcer. L elbow still sore but improved. ROM better Neuro: alert & orientedx3, cranial nerves grossly intact. moves all 4 extremities w/o difficulty. Affect pleasant  Telemetry: SR  Labs: Basic Metabolic Panel:  Recent Labs Lab 08/19/12 0500 08/20/12 0544 08/21/12 0530 08/22/12 0500 08/23/12 0610  NA 137 138 138 146* 139  K 3.2* 3.4* 3.3* 3.1* 3.8  CL 95* 96 98 102 100  CO2 32 32 30 35* 29  GLUCOSE 128* 97 90 92 137*  BUN 31* 29* 28* 23 19  CREATININE 0.81 0.83 0.74 0.66 0.65  CALCIUM 8.2* 8.7 8.6 8.6 9.0    Liver Function Tests: No results  found for this basename: AST, ALT, ALKPHOS, BILITOT, PROT, ALBUMIN,  in the last 168 hours No results found for this basename: LIPASE, AMYLASE,  in the last 168 hours No results found for this basename: AMMONIA,  in the last 168 hours  CBC:  Recent Labs Lab 08/18/12 0640 08/19/12 0500 08/20/12 0544 08/21/12 0530 08/22/12 0500 08/23/12 0610  WBC 9.4 9.0 12.0* 13.7* 11.6* 11.2*  NEUTROABS 7.2 7.6  --  9.7* 9.1* 9.3*  HGB 9.9* 10.0* 10.8* 10.4* 11.2* 11.4*  HCT 31.2* 31.9* 33.3* 33.2* 35.7* 35.9*  MCV 76.8* 76.3* 75.9* 76.5* 77.1* 76.5*  PLT 321 302 343 320 356 332    Cardiac Enzymes:  Recent Labs Lab 08/17/12 0440 08/17/12 1031 08/22/12 1716 08/22/12 2330 08/23/12 0620  TROPONINI <0.30 <0.30 <0.30 <0.30 <0.30    BNP: BNP (last 3 results)  Recent Labs  07/12/12 1645 08/06/12 1328 08/16/12 1417  PROBNP 22696.0* 3626.0* 7892.0*     Imaging: No results found.   Medications:     Scheduled Medications: . antiseptic oral rinse  15 mL Mouth Rinse q12n4p  . collagenase   Topical Daily  . gabapentin  600 mg Oral TID  . insulin aspart  0-9 Units Subcutaneous TID WC  . potassium chloride  40 mEq Oral BID  . rivaroxaban  20 mg Oral Q supper  . sodium chloride  3 mL Intravenous Q12H  .  torsemide  100 mg Oral Daily    Infusions: . treprostinil (REMODULIN) infusion 36 ng/kg/min (08/21/12 1100)    PRN Medications: sodium chloride, acetaminophen, ketoconazole, lidocaine, meperidine (DEMEROL) injection, sodium chloride   Assessment:   1. Chest pain likely due to #2 2. AFL with RVR now in sinus rhythm  3. Severe PAH with cor pulmonale  4. Acute on chronic diastolic HF  5. Cognitive impairment  6. Rash 7. Hypokalemia 8. L elbow pain and immobility  Plan/Discussion:    Looks very good. Doubt CP is ischemic. Elbow pain improved.  Can go home today from cardiac standpoint. Continue Remodulin and Xarelto. If AFL recurs will need amio.   We will arrange  f/u in HF clinic for 1 week. Appt will be placed on chart.   Michaeljames Milnes,MD 9:29 AM

## 2012-08-23 NOTE — Progress Notes (Signed)
Pt in bed O4x, son changed med in med,  Will continue to monitor Pt

## 2012-08-23 NOTE — Progress Notes (Signed)
Internal Medicine Attending  Date: 08/23/2012  Patient name: [name] Medical record number: [id number] Date of birth: 01-17-1960 Age: 52 y.o. Gender: female  I saw and evaluated the patient. I reviewed the resident's note by Dr. Redmond Pulling and I agree with the resident's findings and plans as documented in her note.

## 2012-08-23 NOTE — Progress Notes (Signed)
Subjective: [name] was seen and examined by me this AM.  Her son is visiting her and is currently changing her Remodulin cartridge.  She is laying in bed and has already eaten breakfast.  She says her elbow pain has improved after receiving steroid injection yesterday.  She denies CP/SOB.  She says she feels her CP yesterday was likely indigestion as she has a history of reflux and has not had her usual PPI.    Objective: Vital signs in last 24 hours: Filed Vitals:   08/22/12 2105 08/22/12 2300 08/22/12 2345 08/23/12 0634  BP: 99/61 104/64  112/68  Pulse: 106 92 91 92  Temp: 98.5 F (36.9 C)   98.3 F (36.8 C)  TempSrc: Oral   Oral  Resp: _0 Height:      Weight:    97.841 kg (215 lb 11.2 oz)  SpO2: 99%  97% 99%   Weight change: -1.769 kg (-3 lb 14.4 oz)  Intake/Output Summary (Last 24 hours) at 08/23/12 0953 Last data filed at 08/23/12 0900  Gross per 24 hour  Intake   1180 ml  Output   2652 ml  Net  -1472 ml   General: laying in bed in NAD Cardiac: RRR Pulm: clear to auscultation bilaterally Abd: soft, nontender, nondistended Ext: trace B/L lower extremity edema Neuro: alert and oriented X3  Lab Results: Basic Metabolic Panel:  Recent Labs Lab 08/22/12 0500 08/23/12 0610  NA 146* 139  K 3.1* 3.8  CL 102 100  CO2 35* 29  GLUCOSE 92 137*  BUN 23 19  CREATININE 0.66 0.65  CALCIUM 8.6 9.0   CBC:  Recent Labs Lab 08/22/12 0500 08/23/12 0610  WBC 11.6* 11.2*  NEUTROABS 9.1* 9.3*  HGB 11.2* 11.4*  HCT 35.7* 35.9*  MCV 77.1* 76.5*  PLT 356 332   Cardiac Enzymes:  Recent Labs Lab 08/22/12 1716 08/22/12 2330 08/23/12 0620  TROPONINI <0.30 <0.30 <0.30   BNP:  Recent Labs Lab 08/16/12 1417  PROBNP 7892.0*   CBG:  Recent Labs Lab 08/21/12 2115 08/22/12 0622 08/22/12 1127 08/22/12 1629 08/22/12 2230 08/23/12 0623  GLUCAP 139* 85 113* 137* 137* 128*    Urinalysis:  Recent Labs Lab 08/16/12 1612  COLORURINE YELLOW    LABSPEC 1.012  PHURINE 5.0  GLUCOSEU NEGATIVE  HGBUR NEGATIVE  BILIRUBINUR SMALL*  KETONESUR NEGATIVE  PROTEINUR NEGATIVE  UROBILINOGEN 1.0  NITRITE NEGATIVE  LEUKOCYTESUR TRACE*    Micro Results: Recent Results (from the past 240 hour(s))  MRSA PCR SCREENING     Status: Abnormal   Collection Time    08/16/12  9:41 PM      Result Value Range Status   MRSA by PCR POSITIVE (*) NEGATIVE Final   Comment:            The GeneXpert MRSA Assay (FDA     approved for NASAL specimens     only), is one component of a     comprehensive MRSA colonization     surveillance program. It is not     intended to diagnose MRSA     infection nor to guide or     monitor treatment for     MRSA infections.     RESULT CALLED TO, READ BACK BY AND VERIFIED WITH:     LEONARD,M RN [phone] AT 0050 SKEEN,P   Studies/Results: No results found. Medications: I have reviewed the patient's current medications. Scheduled Meds: . antiseptic oral rinse  15 mL  Mouth Rinse q12n4p  . collagenase   Topical Daily  . gabapentin  600 mg Oral TID  . insulin aspart  0-9 Units Subcutaneous TID WC  . potassium chloride  40 mEq Oral BID  . rivaroxaban  20 mg Oral Q supper  . sodium chloride  3 mL Intravenous Q12H  . torsemide  100 mg Oral Daily   Continuous Infusions: . treprostinil (REMODULIN) infusion 36 ng/kg/min (08/21/12 1100)   PRN Meds:.sodium chloride, acetaminophen, ketoconazole, lidocaine, meperidine (DEMEROL) injection, sodium chloride  Assessment/Plan: [name] is a 52 y.o. woman with cor pulmonale with severe pulmonary hypertension (PAP 95/49) on continuous Remodulin infusion, diastolic heart failure (EF 55% in 7/14), restrictive lung disease, OSA/OHS, chronic venous stasis, morbid obesity, mild mental retardation who presents today after an isolated episode of chest pain and tachycardia. We discussed this patient with Dr. Alva Garnet of PCCM given her significant and complicated cardiopulmonary  issues.  #Pulmonary hypertension vs. CHF exacerbation - ProBNP elevated to 7000 (baseline ~3000), suggestion of pulmonary edema seen on CXR, some JVD + crackles heard at the lung bases on physical exam, concerning for CHF exacerbation. However patient denies worsening SOB, worsening edema in legs or abdomen, orthopnea, PND. Pulmonary embolism is on the differential, but symptoms and vital sign abnormalities not persistent, no unilateral calf pain or leg swelling. Also we would expect a more dramatic presentation for PE in a patient with such tenuous right heart function. Her Wells criteria=+1.5, low risk group for PE, 1.3-3% chance of PE. ?Pneumonia with persistent left basilar atelectasis vs. infiltrate on CXR, however no WBC, no fever, no cough or sputum production. Her heart failure is followed by Dr. Haroldine Laws here. Her pulmonary hypertension is followed by Dr. Gilles Chiquito at [facility].  - Telemetry monitoring, has had no further Aflutter - Daily weights, strict Is/Os  - Cardiology consulted and recommendations appreciated - Continue po Torsemide 176m daily - Continue Remodulin infusion - pharmacy on board; patient's son will continue to bring in cartridges - patient ready for discharge from cardiac standpoint.  #Chest pain - Patient had prior CP that was presumed to be musculoskeletal/costochondritis given exacerbation with movement, reproducibility and troponins negative x 3.  CP had resolved until yesterday morning when she described pleuritic, intermittent CP.  She denied SOB.  Stat EKG was unchanged from prior and without evidence of ischemia.  PE is less likely as patient is on Xarelto.  Troponins negative x 3.  She denies CP this morning. - continue telemetry monitoring  #Tachycardia - Paroxysmal episodes of tachycardia; she had one episode to ~150bpm at home, and one episode to 139 here on the floor, both of which resolved without intervention. Her heart rate is 90s-100s at baseline. Unclear  etiology.    - Telemetry bed with frequent vital sign checks  - Xarelto for Aflutter per Dr. BHaroldine Laws #Hypotension - 80-90s/40-60s on admission. This appears to be around her baseline. Remodulin is a vasodilator and is known to cause hypotension.  - Telemetry bed with vital signs q4h   #OSA - Sleep-disordered breathing thought to be a driver of her cor pulmonale. On CPAP at home and reports compliance.  - Continue CPAP qhs   #Diabetes - Stable. Last A1C 6.0% in 5/14. - Moderate SSI  - Continue gabapentin for peripheral neuropathy  - Holding metformin   #CKD, stage 3 - Baseline Cr seems to be around 1.0-1.2 with GFR 60-70, but with a significantly elevated Cr up to 4.69 during her last hospitalization thought 2/2  volume loss from diarrhea. This admission, Cr 1.82, which is up from 1.44 on 8/7. Will continue to monitor. Cr 0.65 today. -daily BMPs  #Skin ulcers - At right pretibial area (stage 2), sacrum (stage 1), right gluteal cleft (with eschar). Concern low for cellulitis given lack of pain, no erythema, wound without purulence, no history of fevers or chills.  - Wound care consulted and recommendations appreciated - Santyl to buttock wound daily; normal saline and soft silicone dressing for pretibial and R buttock ulcers twice weekly or as needed when soiled or loose.  #Left elbow pain - ROM limited by pain, no palpable effusion, pain localized to posterior elbow/ olecranon bursa. Possible olecranon bursitis.  Initial xray shows "mild prominence of the anterior fat pad which may indicate a small joint effusion"; however patient's pain is in the posterior portion of her elbow. Occult fracture unlikely. Possibly gout but pain has not responded to Prednisone.  Repeat xray 08/17 shows persistent elbow joint effusion, no acute bony abnormalities.  Patient's pain and ROM have significantly improved since yesterday's injection by ortho. - Tylenol prn pain  #Rash - Diffuse, upper and lower  extremities.  Thought to be a side effect of her Remodulin. Not particularly bothersome to patient.  - Continue home ketoconazole cream as needed.   #DVT PPX - Lovenox SQ  Dispo: Disposition is deferred at this time, awaiting improvement of current medical problems.  Anticipated discharge in approximately 1 day(s).   The patient does have a current PCP Luanne Bras, MD) and does need an [facility] hospital follow-up appointment after discharge.  The patient does not know have transportation limitations that hinder transportation to clinic appointments.  .Services Needed at time of discharge: Y = Yes, Blank = No PT:   OT:   RN:   Equipment:   Other:     LOS: 7 days   Duwaine Maxin, DO 08/23/2012, 9:53 AM

## 2012-08-24 ENCOUNTER — Encounter (HOSPITAL_COMMUNITY): Payer: Self-pay

## 2012-08-28 ENCOUNTER — Other Ambulatory Visit: Payer: Self-pay | Admitting: *Deleted

## 2012-08-28 NOTE — Discharge Summary (Signed)
Patient Name:  [name]  MRN: [id number]  PCP: Luanne Bras, MD  DOB:  [birth year]       Date of Admission:  08/16/2012  Date of Discharge:  08/23/2012      Attending Physician: Dr. Guerry Bruin Joines         DISCHARGE DIAGNOSES: 1.   Pulmonary hypertension 2.   SLEEP APNEA, OBSTRUCTIVE 3.   Venous stasis ulcer 4.   Type II or unspecified type diabetes mellitus with neurological manifestations, not stated as uncontrolled(250.60) 5.   CHF exacerbation 6.   Cor pulmonale, chronic 7.   Hypotension 8.   Atrial flutter 9.   Acute on chronic diastolic heart failure    DISPOSITION AND FOLLOW-UP: Recia A Woodroof is to follow-up with the listed providers as detailed below, at patient's visiting, please address following issues:  Follow-up Information   Follow up with [place]. (home health nurse)    Contact information:   [phone]      Follow up with Luanne Bras, MD On 09/15/2012. (1:45 pm)    Specialty:  Internal Medicine   Contact information:   [place] [address] [phone]       Follow up with Glori Bickers, MD On 08/29/2012. (10:20 am)    Specialty:  Cardiology   Contact information:   [address] [place] [address] [phone]      Discharge Orders   Future Appointments Provider Department Dept Phone   09/01/2012 9:40 AM [facility] [phone]   09/15/2012 1:45 PM Luanne Bras, MD [place] [phone]   Future Orders Complete By Expires   (HEART FAILURE PATIENTS) Call MD:  Anytime you have any of the following symptoms: 1) 3 pound weight gain in 24 hours or 5 pounds in 1 week 2) shortness of breath, with or without a dry hacking cough 3) swelling in the hands, feet or stomach 4) if you have to sleep on extra pillows at night in order to breathe.  As directed    Diet Carb Modified  As directed    Discharge wound  care:  As directed    Comments:     For the ulcer on your sacrum ("tailbone"), apply Santyl ointment daily, and change dressing daily.  For the wounds on your right buttocks and right leg, use a soft foam dressing, changing dressing daily.   Increase activity slowly  As directed        DISCHARGE MEDICATIONS:   Medication List         benzonatate 100 MG capsule  Commonly known as:  TESSALON  Take 100 mg by mouth 2 (two) times daily as needed for cough. Take 100 mg by mouth 3 (three) times daily as needed for Cough.     collagenase ointment  Commonly known as:  SANTYL  Apply topically daily. Apply daily to sacral ulcer     fluticasone 50 MCG/ACT nasal spray  Commonly known as:  FLONASE  Place 2 sprays into the nose daily. Place 2 sprays into both nostrils daily.     gabapentin 600 MG tablet  Commonly known as:  NEURONTIN  Take 600 mg by mouth 3 (three) times daily. Take 1 tablet (600 mg total) by mouth 3 (three) times daily.     ketoconazole 2 % cream  Commonly known as:  NIZORAL  Apply topically. Apply topically 2 (two) times daily.  lidocaine 5 % ointment  Commonly known as:  XYLOCAINE  Apply topically. Apply topically 3 (three) times daily as needed.     loratadine 10 MG tablet  Commonly known as:  CLARITIN  Take 10 mg by mouth daily as needed for allergies. Take 10 mg by mouth daily.     metFORMIN 1000 MG tablet  Commonly known as:  GLUCOPHAGE  Take by mouth. Take 1,000 mg by mouth 2 (two) times daily with meals.     OVER THE COUNTER MEDICATION  Take 1 tablet by mouth daily as needed (diarrhea).     pantoprazole 20 MG tablet  Commonly known as:  PROTONIX  Take 20 mg by mouth 2 (two) times daily. Take 20 mg by mouth 2 (two) times daily.     potassium chloride SA 20 MEQ tablet  Commonly known as:  K-DUR,KLOR-CON  Take 20 mEq by mouth daily. Take 40 mEq by mouth daily.     PROAIR HFA 108 (90 BASE) MCG/ACT inhaler  Generic drug:  albuterol  Inhale 2 puffs  into the lungs every 6 (six) hours as needed. Inhale 2 inhalations into the lungs every 6 (six) hours as needed for Wheezing.     RA VITAMIN B-12 TR 1000 MCG Tbcr  Generic drug:  Cyanocobalamin  Take 1,000 mcg by mouth daily. Take 250 mcg by mouth daily.     Rivaroxaban 20 MG Tabs tablet  Commonly known as:  XARELTO  Take 1 tablet (20 mg total) by mouth daily with supper.     torsemide 100 MG tablet  Commonly known as:  DEMADEX  Take 100 mg by mouth daily. Take 1 tablet (100 mg total) by mouth daily.          Patient is on treprostinil (Remodulin) for pulmonary hypertension which is monitored by Dr. Gilles Chiquito at [facility].  The medication is administered as a continuous infusion (36 ng/kg/min 40 mL/24hrs with 15 mg/180m concentration) via Hickman catheter.  This medication was brought in by the patient's son and continued during her hospitalization and at discharge.    CONSULTS:  Cardiology, Heart Failure Orthopedics Wound Care  PROCEDURES PERFORMED:  Dg Elbow 2 Views Left  08/20/2012   CLINICAL DATA:  Severe elbow pain and limited range of motion. No known injury.  EXAM: LEFT ELBOW - 2 VIEW  COMPARISON:  08/16/2012  FINDINGS: No evidence of fracture or dislocation. An elbow joint effusion is again seen. No signs of abnormal arthropathy identified. Degenerative spurring is seen at the olecranon process.  IMPRESSION: Persistent elbow joint effusion. No acute bone abnormality.   Electronically Signed   By: JEarle Gell  On: 08/20/2012 12:34   Dg Elbow Complete Left  08/17/2012   *RADIOLOGY REPORT*  Clinical Data: Diffuse elbow pain, no trauma  LEFT ELBOW - COMPLETE 3+ VIEW  Comparison: Report of prior similar exam 06/22/2002.  Images are not digitized.  Findings: Normal variant lucency of the radial tuberosity is noted. There is mild prominence of the anterior fat pad which may indicate a small joint effusion.  No fracture or dislocation is identified.  IMPRESSION: No fracture or dislocation  identified.  A small joint effusion may be present which could indicate occult fracture but is nonspecific. Consider follow-up imaging if symptoms persist or bone scan if further evaluation is desired.   Original Report Authenticated By: GConchita Paris M.D.   Dg Chest Port 1 View  08/17/2012   *RADIOLOGY REPORT*  Clinical Data: Short of breath  PORTABLE CHEST -  1 VIEW  Comparison: Yesterday  Findings: Right subclavian central venous catheter is stable. Cardiomegaly is stable.  Lungs are under aerated and grossly clear. No pneumothorax.  IMPRESSION: No active cardiopulmonary disease.   Original Report Authenticated By: Marybelle Killings, M.D.   Dg Chest Port 1 View  08/16/2012   *RADIOLOGY REPORT*  Clinical Data: Chest pain  PORTABLE CHEST - 1 VIEW  Comparison: 07/14/2012  Findings: Cardiomegaly again noted.  Mild interstitial prominence bilaterally.  Minimal interstitial edema cannot be excluded. Persistent left basilar atelectasis or infiltrate.  Right lung is clear.  There is a right subclavian catheter with tip in distal SVC.  No diagnostic pneumothorax.  IMPRESSION: Mild interstitial prominence bilaterally.  Minimal interstitial edema cannot be excluded.  Persistent left basilar atelectasis or infiltrate.  Right lung is clear.  There is a right subclavian catheter with tip in distal SVC.  No diagnostic pneumothorax.   Original Report Authenticated By: Lahoma Crocker, M.D.       ADMISSION DATA: H&P: Ms. LOREAN EKSTRAND is a 52 y.o. woman with cor pulmonale and severe pulmonary hypertension (PAP 95/49) on continuous Remodulin infusion, diastolic heart failure (EF 55% in 7/14), restrictive lung disease, OSA/OHS, chronic venous stasis, morbid obesity, mild mental retardation who presents today with an episode of chest pain and tachycardia.  Ms. Wiker was most recently addmitted to [facility] on 07/12/12 with hypotension, hypoxia, and AKI. She was transferred to the ICU and and found to have severe pulmonary hypertension  and cardiogenic shock. She was treated with dopamine and then transferred to [facility] where she was treated with Remodulin (a prostacyclin synthetic analog). Patient was discharged from [facility] on 7/31 on continuous Remodulin infusion (36 ng/kg/min 40 mL/24hrs with 15 mg/152m concentration) via Hickman catheter. Sleep-disordered breathing and diastolic heart failure were thought to be the drivers of her cor pulmonale, so she was instructed to be compliant with her CPAP and placed on torsemide 1028mdaily. Her pulmonary hypertension is being [facility]  She presents today after an episode of chest pain this morning. She describes it as a sharp pain radiating underneath her left breast that began after eating breakfast. The pain was worse with movement. She initially thought it was indigestion; however, her son took her pulse and found it to be 150bpm. They called her pulmonologist, who advised her to go to the ER. The pain resolved by the time they arrived. Denies worsening shortness of breath; she is on O2 4L [place] at home, and is able to walk around the house using her walker with mild dyspnea, which is her baseline. Denies weight changes, increased swelling in her legs or abdomen. She sleeps on 3 pillows (one is a sofa cushion) at baseline. Denies increasing orthopnea or PND. She reports compliance with her Remodulin. The cassette is changed every other day which patient is able to do with assistance from her son. She states she has been compliant with her CPAP at night. She is on a 150095mluid restricted diet, but notes she was ~500m15mer this value the last several days. She has been complaint with salt restriction.  On review of systems, she also complains of left elbow pain for several days, localized to the posterior surface, exacerbated by movement, with no history of trauma. She also notes a diffuse papular rash, non pruritic, which [facility] and told it was  a known side effect of her Remodulin. She states she has a right pretibial ulcer and an ulcer on her buttocks, neither of which are painful. She was followed by wound management while an inpatient at [facility] and has a home health nurse assisting her with them now. Denies fevers or chills.  Physical Exam: Blood pressure 92/62, pulse 99, temperature 98.3 F (36.8 C), temperature source Oral, resp. rate 18, height 5' (1.524 m), weight 231 lb 3.2 oz (104.872 kg), SpO2 93.00%. on 4L [place].  Physical Exam  Constitutional: She is oriented to person, place, and time and well-developed, well-nourished, and in no distress.  HENT:  Head: Normocephalic and atraumatic.  Eyes: Conjunctivae and EOM are normal. Pupils are equal, round, and reactive to light.  Neck: Normal range of motion. Neck supple. JVD (to angle of mandible, but exam limited by body habitus) present.  Cardiovascular: Normal rate, regular rhythm, normal heart sounds and intact distal pulses. Exam reveals no gallop and no friction rub.  No murmur heard. No pitting edema noted in LE. Erythematous skin changes noted at ankles bilaterally.  Pulmonary/Chest: Effort normal. No respiratory distress. She has no wheezes. She has rales (Soft crackles at the lung bases BL, L>R).  Hickman catheter in place.  Abdominal: Soft. Bowel sounds are normal. She exhibits distension (mildly). There is no tenderness.  Musculoskeletal: She exhibits no tenderness (no calf tendereness).  Left elbow: She exhibits decreased range of motion (ROM limited by pain). She exhibits no swelling, no effusion and no deformity.  Chest wall: Some reproduction of chest pain with palpation over left 5th/6th ribs in the midclavicular area.  Neurological: She is alert and oriented to person, place, and time. No cranial nerve deficit. GCS score is 15.  Skin: Skin is warm and dry. Rash (diffuse macular rash) noted. She is not  diaphoretic. There is erythema (at ankles bilaterally).  Right pretibial ulcer, stage 2, with chronic erythematous skin changes and mild warmth, but area is clean, dry, intact with no purulence and no pain. Stage 1 sacral ulcer present. Right gluteal cleft ulcer also present with eschar, unstageable, no surrounding erythema, no purulence.   Labs: Basic Metabolic Panel:   Recent Labs   08/16/12 1544  08/16/12 2211   NA  135  --   K  4.1  --   CL  96  --   CO2  28  --   GLUCOSE  93  --   BUN  55*  --   CREATININE  1.82*  1.66*   CALCIUM  8.6  --    CBC:   Recent Labs   08/16/12 1544  08/16/12 2211   WBC  9.7  9.7   HGB  10.3*  10.6*   HCT  31.5*  32.6*   MCV  76.1*  76.3*   PLT  273  284    Cardiac Enzymes:   Recent Labs   08/16/12 1544  08/16/12 2211   TROPONINI  <0.30  <0.30    BNP:   Recent Labs   08/16/12 1417   PROBNP  7892.0*    CBG:   Recent Labs   08/16/12 2318  08/16/12 2355  08/17/12 0405   GLUCAP  68*  77  95    Urinalysis:   Recent Labs   08/16/12 1612   COLORURINE  YELLOW   LABSPEC  1.012   PHURINE  5.0   GLUCOSEU  NEGATIVE   HGBUR  NEGATIVE   BILIRUBINUR  SMALL*  KETONESUR  NEGATIVE   PROTEINUR  NEGATIVE   UROBILINOGEN  1.0   NITRITE  NEGATIVE   LEUKOCYTESUR  TRACE*     HOSPITAL COURSE: Diastolic HF exacerbation - Ms. Korpela's ProBNP was elevated to 7000 (baseline ~3000), there was suggestion of pulmonary edema on chest xray and JVD and crackles heard at the lung bases concerning for CHF exacerbation.  However she denied worsening SOB, worsening edema in legs or abdomen, orthopnea or PND.  Her heart failure is followed by Dr. Haroldine Laws and he managed her diuresis during hospitalization.    #Pulmonary hypertension and cor pulmonale - Cardiology was consulted at admission.  Her pulmonary hypertension is followed by Dr. Gilles Chiquito at [facility] and she was started on Remodulin in July 2014.  Per cardiology recommendations, patient's Remodulin  was continued during the hospitalization.  #Chest pain - Resolved prior to admission. Suspect musculoskeletal/costochondritis given exacerbation with movement, reproducible pain and negative troponins.  She had one episode of chest pain during admission with no change on EKG and negative troponins.  This episode was likely GI related as the patient reported the discomfort shortly after eating and had not been receiving a PPI.  #Tachycardia - Paroxysmal episodes of tachycardia; she had one episode to ~150bpm at home, and one episode to 139 here on the floor, both of which resolved without intervention. Her heart rate is 90s-100s at baseline.   The patient was monitored on telemetry and Xarelto was started for Aflutter per Dr. Haroldine Laws.  She had no additional episodes of Aflutter during hospitalization.  Cardiology recommends amiodarone if her Aflutter occurs again.   #Hypotension - 80-90s/40-60s on admission. This appears to be around her baseline. Remodulin is a vasodilator and is known to cause hypotension.  The patient was monitored on telemetry with frequent vitals checks.   #OSA - Sleep-disordered breathing thought to be a driver of her cor pulmonale. On CPAP at home and reported compliance.  CPAP was continued during admission.  #Diabetes - Her diabetes is stable. Last HgbA1C 6.0% in 5/14.  Metformin was held and the patient received sliding scale insulin.  Her blood glucose was well controlled (low 100s) in the hospital.  #CKD, stage 3 - Baseline creatinine seems to be around 1.0-1.2 with GFR 60-70.  This admission, creatinine up to 1.82, which is up from 1.44 on 8/7.  Her creatinine trended down to 0.65 on day of discharge.  #Skin ulcers - At right pretibial area (stage 2), sacrum (stage 1), right gluteal cleft (with eschar). Concern low for cellulitis given lack of pain, no erythema, wound without purulence, no history of fevers or chills.  Wound care was consulted and recommended enzymatic  debridement and daily dressing changes.    #Left elbow pain - ROM limited by pain, no palpable effusion, pain localized to posterior elbow/ olecranon bursa.  X-rays were negative for fracture.  Bursitis vs gout were initially suspected. However, Ms. Tabar had no response to oral steroids and continued to have significant pain.  Orthopedics was consulted and provided her with a steroid injection which gave her significant relief.     #Rash - Diffuse, upper and lower extremities. Thought to be a side effect of her Remodulin. Not particularly bothersome to patient. Home ketoconazole was continued.   DISCHARGE DATA: Vital Signs: BP 111/68  Pulse 89  Temp(Src) 98 F (36.7 C) (Oral)  Resp 18  Ht 5' (1.524 m)  Wt 97.841 kg (215 lb 11.2 oz)  BMI 42.13 kg/m2  SpO2 96%  Labs:  No results found for this or any previous visit (from the past 24 hour(s)).   Services Ordered on Discharge: Y = Yes; Blank = No PT:   OT:   RN:   Equipment:   Other:      Time Spent on Discharge: 35 min   Signed: Duwaine Maxin PGY 1, Internal Medicine Resident 08/28/2012, 4:46 PM

## 2012-08-29 ENCOUNTER — Encounter (HOSPITAL_COMMUNITY): Payer: Self-pay

## 2012-09-01 ENCOUNTER — Ambulatory Visit (HOSPITAL_COMMUNITY)
Admission: RE | Admit: 2012-09-01 | Discharge: 2012-09-01 | Disposition: A | Discharge status: Home / Self Care | Payer: Medicaid Other | Source: Ambulatory Visit | Attending: Internal Medicine | Admitting: Internal Medicine

## 2012-09-01 ENCOUNTER — Encounter (HOSPITAL_COMMUNITY): Payer: Self-pay

## 2012-09-01 VITALS — BP 90/64 | HR 100 | Wt 200.1 lb

## 2012-09-01 DIAGNOSIS — G4733 Obstructive sleep apnea (adult) (pediatric): Secondary | ICD-10-CM | POA: Insufficient documentation

## 2012-09-01 DIAGNOSIS — Z9981 Dependence on supplemental oxygen: Secondary | ICD-10-CM | POA: Insufficient documentation

## 2012-09-01 DIAGNOSIS — Z79899 Other long term (current) drug therapy: Secondary | ICD-10-CM | POA: Insufficient documentation

## 2012-09-01 DIAGNOSIS — J4489 Other specified chronic obstructive pulmonary disease: Secondary | ICD-10-CM | POA: Insufficient documentation

## 2012-09-01 DIAGNOSIS — K219 Gastro-esophageal reflux disease without esophagitis: Secondary | ICD-10-CM | POA: Insufficient documentation

## 2012-09-01 DIAGNOSIS — I279 Pulmonary heart disease, unspecified: Secondary | ICD-10-CM

## 2012-09-01 DIAGNOSIS — F3289 Other specified depressive episodes: Secondary | ICD-10-CM | POA: Insufficient documentation

## 2012-09-01 DIAGNOSIS — E785 Hyperlipidemia, unspecified: Secondary | ICD-10-CM | POA: Insufficient documentation

## 2012-09-01 DIAGNOSIS — I2781 Cor pulmonale (chronic): Secondary | ICD-10-CM

## 2012-09-01 DIAGNOSIS — E876 Hypokalemia: Secondary | ICD-10-CM

## 2012-09-01 DIAGNOSIS — I509 Heart failure, unspecified: Secondary | ICD-10-CM | POA: Insufficient documentation

## 2012-09-01 DIAGNOSIS — F329 Major depressive disorder, single episode, unspecified: Secondary | ICD-10-CM | POA: Insufficient documentation

## 2012-09-01 DIAGNOSIS — D509 Iron deficiency anemia, unspecified: Secondary | ICD-10-CM | POA: Insufficient documentation

## 2012-09-01 DIAGNOSIS — E119 Type 2 diabetes mellitus without complications: Secondary | ICD-10-CM | POA: Insufficient documentation

## 2012-09-01 DIAGNOSIS — I872 Venous insufficiency (chronic) (peripheral): Secondary | ICD-10-CM | POA: Insufficient documentation

## 2012-09-01 DIAGNOSIS — I272 Pulmonary hypertension, unspecified: Secondary | ICD-10-CM

## 2012-09-01 DIAGNOSIS — I2789 Other specified pulmonary heart diseases: Secondary | ICD-10-CM

## 2012-09-01 DIAGNOSIS — I4892 Unspecified atrial flutter: Secondary | ICD-10-CM

## 2012-09-01 DIAGNOSIS — J449 Chronic obstructive pulmonary disease, unspecified: Secondary | ICD-10-CM | POA: Insufficient documentation

## 2012-09-01 DIAGNOSIS — I5032 Chronic diastolic (congestive) heart failure: Secondary | ICD-10-CM

## 2012-09-01 MED ORDER — PRAVASTATIN SODIUM 20 MG PO TABS: 20.0000 mg | ORAL_TABLET | Freq: Every day | ORAL | Status: DC

## 2012-09-01 MED ORDER — POTASSIUM CHLORIDE CRYS ER 20 MEQ PO TBCR: 40.0000 meq | EXTENDED_RELEASE_TABLET | Freq: Two times a day (BID) | ORAL | Status: DC

## 2012-09-01 MED ORDER — RIVAROXABAN 20 MG PO TABS: 20.0000 mg | ORAL_TABLET | Freq: Every day | ORAL | Status: DC

## 2012-09-01 MED ORDER — METFORMIN HCL 1000 MG PO TABS: 1000.0000 mg | ORAL_TABLET | Freq: Two times a day (BID) | ORAL | Status: DC

## 2012-09-01 NOTE — Patient Instructions (Signed)
Will get [place] get blood work next week.  Will increase potassium 40 meq to twice a day.   Doing great.  Do the following things EVERYDAY: 1) Weigh yourself in the morning before breakfast. Write it down and keep it in a log. 2) Take your medicines as prescribed 3) Eat low salt foods-Limit salt (sodium) to 2000 mg per day.  4) Stay as active as you can everyday 5) Limit all fluids for the day to less than 2 liters  F/U 2 months

## 2012-09-01 NOTE — Progress Notes (Signed)
[name], female   DOB: 10-Aug-1960, 52 y.o.   MRN: [id number]  Weight Range   Baseline proBNP     HPI: [name] is a 52 y.o. female with history of morbid obesity, mental retardation, type- 2 diabetes mellitus, diastolic HF, cor pulmonale with severe pulm artery HTN started on treprostinil July 2014, chronic venous stasis disease, CRI (1.5) obstructive sleep apnea on CPAP.   She addmitted to [facility] in July with respiratory failure and RHC showed severe PAH with pulmonary pressures in the 90s. She was transferred to [facility] where she was started on Remodulin with excellent results.  07/2012 On dopamine 7.5 mcg/kg/min  RA 20  PAP 95/49  PCWP 15  SVR 2067  PVR 16.6 [place]  CO/CI 2.9/1.38  She was admitted again 08/16/12 for CP and tachycardia, was found to be in atypical flutter vs atrial tach. During her hospital stay she experienced extreme L elbow pain and was treated by ortho with an steroid injection that helped. She was started on Xarelto. Her discharge weight was 215 lbs.     Follow up: Doing well. Rash improved. Wears 4 L O2 chronic. Went to [facility] on Tuesday and she had a 6 MW. Labs were K+ 3.0 was not instructed to take any extra, Cr 1.1 and ProBNP 1722. Doing well with walker. Denies SOB, orthopnea, or CP. Elbow pain is much improved, but still has some mild pain. Taking medications as prescribed. Checks BP at home 100-120/50-60s.   ROS: All systems negative except as listed in HPI, PMH and Problem List.  Past Medical History  Diagnosis Date  . OSA (obstructive sleep apnea)     CPAP  . Venous stasis ulcer     chornic, ?followed up at wound care center, multiple courses of antibiotics in past for cellulitis, on lasix  . GERD (gastroesophageal reflux disease)   . Anemia, iron deficiency     secondary to menhorrhagia, on oral iron, also b12 def, getting monthly b12 shots  . Chronic cough     secondary to alleriges and post nasal drip  . Depression   .  Diabetes mellitus     well controlled on metformin  . Hyperlipidemia   . H/O mental retardation   . Herpes   . Cor pulmonale     PA Peak pressure 45mHg  . Diastolic heart failure   . Restrictive lung disease     PFTs 06/2012 (FVC 54% predicted and FEV1 68% predicted w minimal bronchodilator response).  . CHF (congestive heart failure)   . Renal disorder   . Pulmonary hypertension   . Hypertension   . Shortness of breath   . COPD (chronic obstructive pulmonary disease)     Current Outpatient Prescriptions  Medication Sig Dispense Refill  . albuterol (PROAIR HFA) 108 (90 BASE) MCG/ACT inhaler Inhale 2 puffs into the lungs every 6 (six) hours as needed. Inhale 2 inhalations into the lungs every 6 (six) hours as needed for Wheezing.      . benzonatate (TESSALON) 100 MG capsule Take 100 mg by mouth 2 (two) times daily as needed for cough. Take 100 mg by mouth 3 (three) times daily as needed for Cough.      . collagenase (SANTYL) ointment Apply topically daily. Apply daily to sacral ulcer  15 g  0  . Cyanocobalamin (RA VITAMIN B-12 TR) 1000 MCG TBCR Take 1,000 mcg by mouth daily. Take 250 mcg by mouth daily.      . fluticasone (  FLONASE) 50 MCG/ACT nasal spray Place 2 sprays into the nose daily. Place 2 sprays into both nostrils daily.      Marland Kitchen gabapentin (NEURONTIN) 600 MG tablet Take 600 mg by mouth 3 (three) times daily. Take 1 tablet (600 mg total) by mouth 3 (three) times daily.      Marland Kitchen ketoconazole (NIZORAL) 2 % cream Apply topically. Apply topically 2 (two) times daily.      Marland Kitchen lidocaine (XYLOCAINE) 5 % ointment Apply topically. Apply topically 3 (three) times daily as needed.      . loratadine (CLARITIN) 10 MG tablet Take 10 mg by mouth daily as needed for allergies. Take 10 mg by mouth daily.      . metFORMIN (GLUCOPHAGE) 1000 MG tablet Take by mouth. Take 1,000 mg by mouth 2 (two) times daily with meals.      Marland Kitchen OVER THE COUNTER MEDICATION Take 1 tablet by mouth daily as needed  (diarrhea).      . pantoprazole (PROTONIX) 20 MG tablet Take 20 mg by mouth 2 (two) times daily. Take 20 mg by mouth 2 (two) times daily.      . potassium chloride SA (K-DUR,KLOR-CON) 20 MEQ tablet Take 20 mEq by mouth daily. Take 40 mEq by mouth daily.      . Rivaroxaban (XARELTO) 20 MG TABS tablet Take 1 tablet (20 mg total) by mouth daily with supper.  30 tablet  1  . torsemide (DEMADEX) 100 MG tablet Take 100 mg by mouth daily. Take 1 tablet (100 mg total) by mouth daily.       No current facility-administered medications for this encounter.    Filed Vitals:   09/01/12 0945  BP: 90/64  Pulse: 100  Weight: 200 lb 1.9 oz (90.774 kg)  SpO2: 97%  Standing BP 100/70  Physical Exam:  General: Morbidly obese. On chronic 4 L O2 with walker HEENT: normal  Neck: supple. JVP difficult to assess d/t body habitus, but appears flat  Carotids 2+ bilat; no bruits. No lymphadenopathy or thryomegaly appreciated.  Cor: PMI nonpalpable. RRR Lungs: inspiratory wheezes Abdomen: soft, nontender, nondistended. No hepatosplenomegaly. No bruits or masses. Good bowel sounds.  Extremities: no cyanosis, clubbing, rash, no edema; TED hose intact Neuro: alert & orientedx3, cranial nerves grossly intact. moves all 4 extremities w/o difficulty. Affect pleasant    ASSESSMENT & PLAN:  1) Chronic diastolic HF, EF 44-03% RV mod reduced - NYHA III symptoms. Volume status improved - Weight down 15 lbs since discharge. Will need to follow weight and renal function closely. May need to cut back on torsemide to 80 mg daily. Will have [place] get BMET next week. - K+ 3.0 from records from [facility] and she says she has not increased, will increase daily dose to 40 meq BID.  2) PAH, severe with cor pulmonale - On treprostinil through hickman cath - follwoed by Dr. Gilles Chiquito at [facility]  3) Paroxysmal AFL -on Xarelto. If recurs will need amio.   4) Hypokalmia -increase KCL as above  F/U 2 months  Junie Bame B NP-C 12:14  PM  Patient seen and examined with Junie Bame, NP. We discussed all aspects of the encounter. I agree with the assessment and plan as stated above. She is much improved with diuresis and treprostinil. Will need to watch closely for over diuresis. Renal function stable on recent BMET. Will recheck next week. AFL currently quiescent. Continue Xarelto for anti-coag. If recurs may need amio.   Sheriece Jefcoat,MD 3:27 PM

## 2012-09-02 DIAGNOSIS — E876 Hypokalemia: Secondary | ICD-10-CM | POA: Insufficient documentation

## 2012-09-02 DIAGNOSIS — I272 Pulmonary hypertension, unspecified: Secondary | ICD-10-CM | POA: Insufficient documentation

## 2012-09-05 NOTE — Addendum Note (Signed)
Encounter addended by: Georga Kaufmann, CCT on: 09/05/2012  9:04 AM<BR>     Documentation filed: Charges VN

## 2012-09-07 ENCOUNTER — Other Ambulatory Visit: Payer: Self-pay | Admitting: Internal Medicine

## 2012-09-08 ENCOUNTER — Other Ambulatory Visit: Payer: Self-pay | Admitting: *Deleted

## 2012-09-08 MED ORDER — PANTOPRAZOLE SODIUM 20 MG PO TBEC: 20.0000 mg | DELAYED_RELEASE_TABLET | Freq: Two times a day (BID) | ORAL | Status: DC

## 2012-09-15 ENCOUNTER — Encounter: Payer: Self-pay | Admitting: Internal Medicine

## 2012-09-29 ENCOUNTER — Encounter: Payer: Self-pay | Admitting: Internal Medicine

## 2012-09-29 ENCOUNTER — Ambulatory Visit (INDEPENDENT_AMBULATORY_CARE_PROVIDER_SITE_OTHER): Payer: Medicaid Other | Admitting: Internal Medicine

## 2012-09-29 VITALS — BP 93/59 | HR 91 | Temp 97.4°F | Ht 62.0 in | Wt 206.4 lb

## 2012-09-29 DIAGNOSIS — G4733 Obstructive sleep apnea (adult) (pediatric): Secondary | ICD-10-CM

## 2012-09-29 DIAGNOSIS — I272 Pulmonary hypertension, unspecified: Secondary | ICD-10-CM

## 2012-09-29 DIAGNOSIS — I279 Pulmonary heart disease, unspecified: Secondary | ICD-10-CM

## 2012-09-29 DIAGNOSIS — I2781 Cor pulmonale (chronic): Secondary | ICD-10-CM

## 2012-09-29 DIAGNOSIS — E876 Hypokalemia: Secondary | ICD-10-CM

## 2012-09-29 DIAGNOSIS — E1149 Type 2 diabetes mellitus with other diabetic neurological complication: Secondary | ICD-10-CM

## 2012-09-29 DIAGNOSIS — I2789 Other specified pulmonary heart diseases: Secondary | ICD-10-CM

## 2012-09-29 LAB — BASIC METABOLIC PANEL
BUN: 33 mg/dL — ABNORMAL HIGH (ref 6–23)
Calcium: 9.9 mg/dL (ref 8.4–10.5)
Glucose, Bld: 98 mg/dL (ref 70–99)
Sodium: 140 mEq/L (ref 135–145)

## 2012-09-29 LAB — POCT GLYCOSYLATED HEMOGLOBIN (HGB A1C): Hemoglobin A1C: 5.3

## 2012-09-29 MED ORDER — METFORMIN HCL 1000 MG PO TABS: 1000.0000 mg | ORAL_TABLET | Freq: Two times a day (BID) | ORAL | Status: DC

## 2012-09-29 MED ORDER — BENZONATATE 100 MG PO CAPS: 100.0000 mg | ORAL_CAPSULE | Freq: Two times a day (BID) | ORAL | Status: DC | PRN

## 2012-09-29 MED ORDER — RIVAROXABAN 20 MG PO TABS: 20.0000 mg | ORAL_TABLET | Freq: Every day | ORAL | Status: DC

## 2012-09-29 MED ORDER — METFORMIN HCL 1000 MG PO TABS: 500.0000 mg | ORAL_TABLET | Freq: Two times a day (BID) | ORAL | Status: DC

## 2012-09-29 NOTE — Patient Instructions (Addendum)
General Instructions: 1. Please schedule a follow up appointment for 3 months  2. Please take all medications as prescribed. Take Metformin 500 mg two times daily instead of 1000 mg two times daily. 3. If you have worsening of your symptoms or new symptoms arise, please call the clinic ([phone]), or go to the ER immediately if symptoms are severe.  You have done a great job in taking all your medications. I appreciate it very much. Please continue doing that.   Treatment Goals:  Goals (1 Years of Data) as of 09/29/12         As of Today 09/01/12 08/23/12 08/23/12 08/22/12     Blood Pressure    . Blood Pressure < 140/90  93/59 90/64 111/68 112/68 104/64     Diet    . Eat more fruits and vegetables          . Reduce portion size           Lifestyle    . Increase physical activity           Result Component    . HEMOGLOBIN A1C < 7.0  5.3        . LDL CALC < 100            Progress Toward Treatment Goals:  Treatment Goal 09/29/2012  Hemoglobin A1C improved    Self Care Goals & Plans:  Self Care Goal 09/29/2012  Manage my medications take my medicines as prescribed; bring my medications to every visit  Monitor my health keep track of my blood glucose; bring my glucose meter and log to each visit  Eat healthy foods -  Meeting treatment goals maintain the current self-care plan    Home Blood Glucose Monitoring 09/29/2012  Check my blood sugar no home glucose monitoring     Care Management & Community Referrals:  Referral 09/29/2012  Referrals made for care management support none needed

## 2012-09-29 NOTE — Progress Notes (Signed)
[name], female   DOB: 04/19/1960, 52 y.o.   MRN: [id number]   Subjective:   [name]   DOB: Aug 18, 1960 52 y.o.   MRN: [id number]  HPI: [name] is a 52 y.o. 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 The patient has a history of severe pulmonary hypertension and follows at [facility] for this issue and is currently on treprostinil therapy. She also currently uses 4L O2 via [place] at home, chronically. The patient was recently admitted on 08/16/12 for chest pain and tachycardia, found to be in atrial tachycardia/flutter.   Today, she claims to be feeling very well. She denies any recent issues and is accompanied by her son, who also agrees that her recent health has been good. The patient also follows w/ Dr. Haroldine Laws in the CHF clinic and is supposed to return on 10/03/12.  Ms. Turnbaugh last HbA1c was in 05/2012 and was 6.0 at that time. Today, repeat A1c is 5.3. She is currently only taking Metformin 1000 mg bid. She denies any recent symptoms of hypo- or hyperglycemia.    Past Medical History  Diagnosis Date  . OSA (obstructive sleep apnea)     CPAP  . Venous stasis ulcer     chornic, ?followed up at wound care center, multiple courses of antibiotics in past for cellulitis, on lasix  . GERD (gastroesophageal reflux disease)   . Anemia, iron deficiency     secondary to menhorrhagia, on oral iron, also b12 def, getting monthly b12 shots  . Chronic cough     secondary to alleriges and post nasal drip  . Depression   . Diabetes mellitus     well controlled on metformin  . Hyperlipidemia   . H/O mental retardation   . Herpes   . Cor pulmonale     PA Peak pressure 24mHg  . Diastolic heart failure   . Restrictive lung disease     PFTs 06/2012 (FVC 54% predicted and FEV1 68% predicted w minimal bronchodilator  response).  . CHF (congestive heart failure)   . Renal disorder   . Pulmonary hypertension   . Hypertension   . Shortness of breath   . COPD (chronic obstructive pulmonary disease)    Current Outpatient Prescriptions  Medication Sig Dispense Refill  . albuterol (PROAIR HFA) 108 (90 BASE) MCG/ACT inhaler Inhale 2 puffs into the lungs every 6 (six) hours as needed. Inhale 2 inhalations into the lungs every 6 (six) hours as needed for Wheezing.      . benzonatate (TESSALON) 100 MG capsule Take 100 mg by mouth 2 (two) times daily as needed for cough. Take 100 mg by mouth 3 (three) times daily as needed for Cough.      . fluticasone (FLONASE) 50 MCG/ACT nasal spray Place 2 sprays into the nose daily.       .Marland Kitchengabapentin (NEURONTIN) 600 MG tablet Take 600 mg by mouth 3 (three) times daily.       .Marland Kitchenketoconazole (NIZORAL) 2 % cream Apply topically. Apply topically 2 (two) times daily.      .Marland Kitchenlidocaine (XYLOCAINE) 5 % ointment Apply topically. Apply topically 3 (three) times daily as needed.      . loratadine (CLARITIN) 10 MG tablet Take 10 mg by mouth daily as needed for allergies. Take 10 mg by mouth daily.      .Marland Kitchen  metFORMIN (GLUCOPHAGE) 1000 MG tablet Take 1 tablet (1,000 mg total) by mouth 2 (two) times daily with a meal.  60 tablet  0  . metFORMIN (GLUCOPHAGE) 1000 MG tablet Take 1,000 mg by mouth 2 (two) times daily with a meal.      . ondansetron (ZOFRAN) 4 MG tablet Take 4 mg by mouth every 8 (eight) hours as needed for nausea.      Marland Kitchen OVER THE COUNTER MEDICATION Take 1 tablet by mouth daily as needed (diarrhea).      . pantoprazole (PROTONIX) 20 MG tablet Take 1 tablet (20 mg total) by mouth 2 (two) times daily. Take 20 mg by mouth 2 (two) times daily.  60 tablet  6  . potassium chloride SA (K-DUR,KLOR-CON) 20 MEQ tablet Take 2 tablets (40 mEq total) by mouth 2 (two) times daily.  360 tablet  3  . potassium chloride SA (K-DUR,KLOR-CON) 20 MEQ tablet Take 20 mEq by mouth daily.      . pravastatin  (PRAVACHOL) 20 MG tablet Take 1 tablet (20 mg total) by mouth daily.  30 tablet  6  . pravastatin (PRAVACHOL) 20 MG tablet Take 20 mg by mouth daily.      . Rivaroxaban (XARELTO) 20 MG TABS tablet Take 1 tablet (20 mg total) by mouth daily with supper.  30 tablet  1  . Rivaroxaban (XARELTO) 20 MG TABS tablet Take 20 mg by mouth daily.      Marland Kitchen torsemide (DEMADEX) 100 MG tablet Take 100 mg by mouth daily.       . vitamin B-12 (CYANOCOBALAMIN) 500 MCG tablet Take 250 mcg by mouth daily.       No current facility-administered medications for this visit.   Family History  Problem Relation Age of Onset  . Mental illness Sister   . Mental retardation Brother    History   Social History  . Marital Status: Single    Spouse Name: N/A    Number of Children: N/A  . Years of Education: N/A   Social History Main Topics  . Smoking status: Never Smoker   . Smokeless tobacco: Never Used  . Alcohol Use: No  . Drug Use: No  . Sexual Activity: None   Other Topics Concern  . None   Social History Narrative  . None   Review of Systems: General: Denies fever, chills, diaphoresis, appetite change and fatigue.  Respiratory: Positive for DOE, Denies SOB, cough, chest tightness, and wheezing.   Cardiovascular: Denies chest pain, palpitations and leg swelling.  Gastrointestinal: Denies nausea, vomiting, abdominal pain, diarrhea, constipation, blood in stool and abdominal distention.  Genitourinary: Denies dysuria, urgency, frequency, hematuria, flank pain and difficulty urinating.  Endocrine: Denies hot or cold intolerance, sweats, polyuria, polydipsia. Musculoskeletal: Denies myalgias, back pain, joint swelling, arthralgias and gait problem.  Skin: Denies pallor, rash and wounds.  Neurological: Denies dizziness, seizures, syncope, weakness, lightheadedness, numbness and headaches.  Psychiatric/Behavioral: Denies mood changes, confusion, nervousness, sleep disturbance and agitation.  Objective:    Physical Exam: Filed Vitals:   09/29/12 0930  BP: 93/59  Pulse: 91  Temp: 97.4 F (36.3 C)  TempSrc: Oral  Height: _0  (1.575 m)  Weight: 206 lb 6.4 oz (93.622 kg)  SpO2: 100%   General: Vital signs reviewed.  Patient is a well-developed and well-nourished, in no acute distress and cooperative with exam. Alert and oriented x3.  Head: Normocephalic and atraumatic. Eyes: PERRL, EOMI, conjunctivae normal, No scleral icterus.  Neck: Supple, trachea midline, normal  ROM, No JVD, masses, thyromegaly, or carotid bruit present.  Cardiovascular: RRR, S1 normal, S2 normal, no murmurs, gallops, or rubs. Pulmonary/Chest: Normal respiratory effort, CTAB, no wheezes, rales, or rhonchi. On 4L O2 via [employer]. Abdominal: Soft, non-tender, non-distended, bowel sounds are normal, no masses, organomegaly, or guarding present.  Musculoskeletal: No joint deformities, erythema, or stiffness, ROM full and no nontender. Extremities: Trace pitting edema in LE's, pulses symmetric and intact bilaterally. No cyanosis or clubbing. Neurological: A&O x3, Strength is normal and symmetric bilaterally, cranial nerve II-XII are grossly intact, no focal motor deficit, sensory intact to light touch bilaterally.  Skin: Warm, dry and intact. Healing rash on UE's 2/2 Remodulin. Psychiatric: Normal mood and affect. Patient w/ mild mental retardation. Speech is pressured at times, behavior is normal.  Assessment & Plan:   Please see problem-based assessment and plan.

## 2012-09-30 NOTE — Assessment & Plan Note (Addendum)
Follows at CHF clinic. Patient reports no recent symptoms of SOB, LE swelling, etc. Son endorses that her functional status has improved recently and she is now able to walk farther than she has in a long time. -Next CHF appointment is on 10/03/12.

## 2012-09-30 NOTE — Assessment & Plan Note (Signed)
Repeat HbA1c today is 5.3, down from 6.0 in 05/2012. Patient does not have any symptoms of hypoglycemia and does not regularly check her sugars at home.  -Decreased Metformin to 500 mg bid from 1000 mg bid.  -Will have patient follow up in 3 months for re-check of HbA1c and make adjustments to medication regimen at that time, if necessary.

## 2012-09-30 NOTE — Assessment & Plan Note (Addendum)
Follow w/ Dr. Gilles Chiquito at [facility] for [facility], currently on treprostinil therapy, which has been very effective. No issues at this time. On 4L O2 via [place] chronically. -Next appointment at [facility] is 12/20/12.

## 2012-09-30 NOTE — Assessment & Plan Note (Signed)
Patient with a history of hypokalemia, on Torsemide 100 mg qd.  -Recheck BMET today shows K of 3.6.

## 2012-09-30 NOTE — Assessment & Plan Note (Signed)
Patient very compliant with CPAP at home. States no recent issues.

## 2012-10-02 NOTE — Progress Notes (Signed)
I saw and evaluated the patient.  I personally confirmed the key portions of the history and exam documented by Dr. Ronnald Ramp and I reviewed pertinent patient test results.  The assessment, diagnosis, and plan were formulated together and I agree with the documentation in the resident's note.

## 2012-10-03 ENCOUNTER — Ambulatory Visit (HOSPITAL_COMMUNITY)
Admission: RE | Admit: 2012-10-03 | Discharge: 2012-10-03 | Disposition: A | Discharge status: Home / Self Care | Payer: Medicaid Other | Source: Ambulatory Visit | Attending: Cardiology | Admitting: Cardiology

## 2012-10-03 VITALS — BP 106/62 | HR 97 | Wt 209.2 lb

## 2012-10-03 DIAGNOSIS — I272 Pulmonary hypertension, unspecified: Secondary | ICD-10-CM

## 2012-10-03 DIAGNOSIS — I4892 Unspecified atrial flutter: Secondary | ICD-10-CM | POA: Insufficient documentation

## 2012-10-03 DIAGNOSIS — I5032 Chronic diastolic (congestive) heart failure: Secondary | ICD-10-CM

## 2012-10-03 DIAGNOSIS — I2789 Other specified pulmonary heart diseases: Secondary | ICD-10-CM | POA: Insufficient documentation

## 2012-10-03 MED ORDER — TORSEMIDE 20 MG PO TABS: 80.0000 mg | ORAL_TABLET | Freq: Every day | ORAL | Status: DC

## 2012-10-03 MED ORDER — POTASSIUM CHLORIDE CRYS ER 20 MEQ PO TBCR: 40.0000 meq | EXTENDED_RELEASE_TABLET | Freq: Two times a day (BID) | ORAL | Status: DC

## 2012-10-03 NOTE — Patient Instructions (Addendum)
Increase torsemide (fluid pill) to 100 mg for 3 days and then go back to 80 mg daily. Take an extra potassium tablet as well for three days (total 5) and then go back to 40 meq (2 tabs) twice a day.  Keep up the good work.  If rash does not get better call [facility].  Follow up in 2 months  Will get labs in 1 month  Do the following things EVERYDAY: 1) Weigh yourself in the morning before breakfast. Write it down and keep it in a log. 2) Take your medicines as prescribed 3) Eat low salt foods-Limit salt (sodium) to 2000 mg per day.  4) Stay as active as you can everyday 5) Limit all fluids for the day to less than 2 liters 6)

## 2012-10-03 NOTE — Progress Notes (Signed)
[name], female   DOB: 07/24/1960, 52 y.o.   MRN: [id number]  Weight Range   Baseline proBNP     HPI: [name] is a 52 y.o. 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   She addmitted to [facility] in July with respiratory failure and RHC showed severe PAH with pulmonary pressures in the 90s. She was transferred to [facility] where she was started on Remodulin with excellent results.  07/2012 On dopamine 7.5 mcg/kg/min  RA 20  PAP 95/49  PCWP 15  SVR 2067  PVR 16.6 [place]  CO/CI 2.9/1.38  She was admitted again 08/16/12 for CP and tachycardia, was found to be in atypical flutter vs atrial tach. During her hospital stay she experienced extreme L elbow pain and was treated by ortho with an steroid injection that helped. She was started on Xarelto. Her discharge weight was 215 lbs.     Labs        09/29/12: K+ 3.6, Creatinine 0.87  Follow up: Currently on 80 mg torsemide and 40 meq BID K+. Metformin was changed by Dr. Ronnald Ramp to 500 mg BID. Weights at home 200-204 lbs. She is up 9 lbs by our scales in the office, however.  Doing well. Has erythema around the area where Hickman dressing is placed. She reports some pruritis and burning at site. Wears 4 L O2 chronically. Doing well with walker. Denies SOB, orthopnea, or CP. Taking medications as prescribed. Checks BP at home 100-120/50-60s.  She is in NSR today.   ECG: NSR, RVH, prolonged QT interval  ROS: All systems negative except as listed in HPI, PMH and Problem List.  Past Medical History  Diagnosis Date  . OSA (obstructive sleep apnea)     CPAP  . Venous stasis ulcer     chornic, ?followed up at wound care center, multiple courses of antibiotics in past for cellulitis, on lasix  . GERD  (gastroesophageal reflux disease)   . Anemia, iron deficiency     secondary to menhorrhagia, on oral iron, also b12 def, getting monthly b12 shots  . Chronic cough     secondary to alleriges and post nasal drip  . Depression   . Diabetes mellitus     well controlled on metformin  . Hyperlipidemia   . H/O mental retardation   . Herpes   . Cor pulmonale     PA Peak pressure 66mHg  . Diastolic heart failure   . Restrictive lung disease     PFTs 06/2012 (FVC 54% predicted and FEV1 68% predicted w minimal bronchodilator response).  . CHF (congestive heart failure)   . Renal disorder   . Pulmonary hypertension   . Hypertension   . Shortness of breath   . COPD (chronic obstructive pulmonary disease)     Current Outpatient Prescriptions  Medication Sig Dispense Refill  . albuterol (PROAIR HFA) 108 (90 BASE) MCG/ACT inhaler Inhale 2 puffs into the lungs every 6 (six) hours as needed. Inhale 2 inhalations into the lungs every 6 (six) hours as needed for Wheezing.      . benzonatate (TESSALON) 100 MG capsule Take 1 capsule (100 mg total) by mouth 2 (two) times daily as needed for cough. Take 100 mg by mouth 3 (three) times daily as needed for Cough.  2[place]  capsule  0  . fluticasone (FLONASE) 50 MCG/ACT nasal spray Place 2 sprays into the nose daily.       Marland Kitchen gabapentin (NEURONTIN) 600 MG tablet Take 600 mg by mouth 3 (three) times daily.       Marland Kitchen ketoconazole (NIZORAL) 2 % cream Apply topically. Apply topically 2 (two) times daily.      Marland Kitchen lidocaine (XYLOCAINE) 5 % ointment Apply topically. Apply topically 3 (three) times daily as needed.      . loratadine (CLARITIN) 10 MG tablet Take 10 mg by mouth daily as needed for allergies. Take 10 mg by mouth daily.      . metFORMIN (GLUCOPHAGE) 1000 MG tablet Take 0.5 tablets (500 mg total) by mouth 2 (two) times daily with a meal.  60 tablet  5  . ondansetron (ZOFRAN) 4 MG tablet Take 4 mg by mouth every 8 (eight) hours as needed for nausea.      Marland Kitchen OVER  THE COUNTER MEDICATION Take 1 tablet by mouth daily as needed (diarrhea).      . pantoprazole (PROTONIX) 20 MG tablet Take 1 tablet (20 mg total) by mouth 2 (two) times daily. Take 20 mg by mouth 2 (two) times daily.  60 tablet  6  . potassium chloride SA (K-DUR,KLOR-CON) 20 MEQ tablet Take 2 tablets (40 mEq total) by mouth 2 (two) times daily.  360 tablet  3  . potassium chloride SA (K-DUR,KLOR-CON) 20 MEQ tablet Take 20 mEq by mouth daily.      . pravastatin (PRAVACHOL) 20 MG tablet Take 1 tablet (20 mg total) by mouth daily.  30 tablet  6  . pravastatin (PRAVACHOL) 20 MG tablet Take 20 mg by mouth daily.      . Rivaroxaban (XARELTO) 20 MG TABS tablet Take 20 mg by mouth daily.      . Rivaroxaban (XARELTO) 20 MG TABS tablet Take 1 tablet (20 mg total) by mouth daily with supper.  30 tablet  1  . torsemide (DEMADEX) 100 MG tablet Take 100 mg by mouth daily.       . vitamin B-12 (CYANOCOBALAMIN) 500 MCG tablet Take 250 mcg by mouth daily.       No current facility-administered medications for this encounter.    Filed Vitals:   10/03/12 1111  BP: 106/62  Pulse: 97  Weight: 209 lb 4 oz (94.915 kg)  SpO2: 99%  Standing BP 100/70  Physical Exam:  General: Morbidly obese. On chronic 4 L O2 with walker HEENT: normal  Neck: supple. JVP difficult to assess d/t body habitus, but appears slightly elevated  Carotids 2+ bilat; no bruits. No lymphadenopathy or thryomegaly appreciated.  Cor: PMI nonpalpable. RRR Lungs: inspiratory wheezes Abdomen: soft, nontender, nondistended. No hepatosplenomegaly. No bruits or masses. Good bowel sounds.  Extremities: no cyanosis, clubbing, rash, no edema; TED hose intact Neuro: alert & orientedx3, cranial nerves grossly intact. moves all 4 extremities w/o difficulty. Affect pleasant    ASSESSMENT & PLAN:  1) Chronic diastolic HF/cor pulmonale: LV EF 60-65% RV mod reduced.  NYHA II symptoms, improved from prior.  - Volume status slightly elevated (more based  on weight as exam is difficult). Will increase torsemide to 100 mg for 3 days along with an extra 20 meq KCL, then go back to 80 mg torsemide daily and 40 meq BID KCl. - BMET checked yesterday with Dr. Ronnald Ramp and Cr and KCL stable.  - Reinforced the need and importance of daily weights, a low sodium diet, and  fluid restriction (less than 2 L a day). Instructed to call the HF clinic if weight increases more than 3 lbs overnight or 5 lbs in a week.  - Will recheck BMET in 1 month with [facility]  2) PAH, severe with cor pulmonale.  Patient has restrictive PFTs and OSA which likely contribute.  Possible primary pulmonary HTN.  - On treprostinil IV infusion through hickman cath - follwoed by Dr. Gilles Chiquito at [facility] - slight erythema around dressing site maybe related to dressing. Have asked her to follow up with [facility] or [facility] to assess if there is another dressing they can use.   3) Paroxysmal atrial flutter.  She is in NSR today.  - Continue Xarelto.  F/U 2 months  Junie Bame B NP-C 11:14 AM  Patient seen with NP, agree with the above note.  She is stable today.  Volume is difficult to discern.  As above, given weight gain, will increase torsemide for 3 days.  She will continue treprostinil IV infusion for pulmonary arterial HTN per Dr. Gilles Chiquito at [facility].   Loralie Champagne 10/03/2012

## 2012-10-09 ENCOUNTER — Emergency Department (HOSPITAL_COMMUNITY): Payer: Medicaid Other

## 2012-10-09 ENCOUNTER — Encounter (HOSPITAL_COMMUNITY): Payer: Self-pay | Admitting: Emergency Medicine

## 2012-10-09 ENCOUNTER — Emergency Department (HOSPITAL_COMMUNITY)
Admission: EM | Admit: 2012-10-09 | Discharge: 2012-10-09 | Disposition: A | Discharge status: Home / Self Care | Payer: Medicaid Other | Attending: Emergency Medicine | Admitting: Emergency Medicine

## 2012-10-09 DIAGNOSIS — F329 Major depressive disorder, single episode, unspecified: Secondary | ICD-10-CM | POA: Insufficient documentation

## 2012-10-09 DIAGNOSIS — Z95828 Presence of other vascular implants and grafts: Secondary | ICD-10-CM

## 2012-10-09 DIAGNOSIS — E119 Type 2 diabetes mellitus without complications: Secondary | ICD-10-CM | POA: Insufficient documentation

## 2012-10-09 DIAGNOSIS — J441 Chronic obstructive pulmonary disease with (acute) exacerbation: Secondary | ICD-10-CM | POA: Insufficient documentation

## 2012-10-09 DIAGNOSIS — E785 Hyperlipidemia, unspecified: Secondary | ICD-10-CM | POA: Insufficient documentation

## 2012-10-09 DIAGNOSIS — IMO0002 Reserved for concepts with insufficient information to code with codable children: Secondary | ICD-10-CM | POA: Insufficient documentation

## 2012-10-09 DIAGNOSIS — G4733 Obstructive sleep apnea (adult) (pediatric): Secondary | ICD-10-CM | POA: Insufficient documentation

## 2012-10-09 DIAGNOSIS — M7989 Other specified soft tissue disorders: Secondary | ICD-10-CM | POA: Insufficient documentation

## 2012-10-09 DIAGNOSIS — Z87448 Personal history of other diseases of urinary system: Secondary | ICD-10-CM | POA: Insufficient documentation

## 2012-10-09 DIAGNOSIS — Z862 Personal history of diseases of the blood and blood-forming organs and certain disorders involving the immune mechanism: Secondary | ICD-10-CM | POA: Insufficient documentation

## 2012-10-09 DIAGNOSIS — I10 Essential (primary) hypertension: Secondary | ICD-10-CM | POA: Insufficient documentation

## 2012-10-09 DIAGNOSIS — T82598A Other mechanical complication of other cardiac and vascular devices and implants, initial encounter: Secondary | ICD-10-CM | POA: Insufficient documentation

## 2012-10-09 DIAGNOSIS — Y840 Cardiac catheterization as the cause of abnormal reaction of the patient, or of later complication, without mention of misadventure at the time of the procedure: Secondary | ICD-10-CM | POA: Insufficient documentation

## 2012-10-09 DIAGNOSIS — F3289 Other specified depressive episodes: Secondary | ICD-10-CM | POA: Insufficient documentation

## 2012-10-09 DIAGNOSIS — Z8619 Personal history of other infectious and parasitic diseases: Secondary | ICD-10-CM | POA: Insufficient documentation

## 2012-10-09 DIAGNOSIS — K219 Gastro-esophageal reflux disease without esophagitis: Secondary | ICD-10-CM | POA: Insufficient documentation

## 2012-10-09 DIAGNOSIS — I5030 Unspecified diastolic (congestive) heart failure: Secondary | ICD-10-CM | POA: Insufficient documentation

## 2012-10-09 DIAGNOSIS — Z79899 Other long term (current) drug therapy: Secondary | ICD-10-CM | POA: Insufficient documentation

## 2012-10-09 MED ORDER — TREPROSTINIL SODIUM 1 MG/ML IJ SOLN
80.0000 ng/kg/min | INTRAMUSCULAR | Status: DC
  Administered 2012-10-09: 80 ng/kg/min via INTRAVENOUS

## 2012-10-09 MED ORDER — DIPHENHYDRAMINE HCL 25 MG PO CAPS
25.0000 mg | ORAL_CAPSULE | Freq: Once | ORAL | Status: AC
  Administered 2012-10-09: 25 mg via ORAL
  Filled 2012-10-09: qty 1

## 2012-10-09 MED ORDER — SODIUM CHLORIDE 0.9 % IJ SOLN: 10.0000 mL | INTRAMUSCULAR | Status: DC | PRN

## 2012-10-09 MED ORDER — PH 12 STERILE DILUENT: 2.0000 ng/kg/min | INTRAVENOUS | Status: DC

## 2012-10-09 NOTE — ED Notes (Signed)
Discharge instructions reviewed with pt. Pt verbalized understanding.   

## 2012-10-09 NOTE — ED Notes (Signed)
Pt was at home and got up from the couch to go to the kitchen when she noticed her access to was on the floor. Pt states she has pulmonary HTN and had a line placed at [facility]. Pt denies any pain but states she has a burning sensation to the area. Vitals 108/80, 100 p, 20 resp, 4L 02.

## 2012-10-09 NOTE — ED Provider Notes (Signed)
CSN: [id number]     Arrival date & time 10/09/12  1604 History   First MD Initiated Contact with Patient 10/09/12 1607     Chief Complaint  Patient presents with  . Loss of IV access    (Consider location/radiation/quality/duration/timing/severity/associated sxs/prior Treatment) HPI Comments: 52 year old female with an extensive past medical history including CHF and pulmonary hypertension presents to the emergency department with an IV access issue. Patient states about 45 minutes prior to arrival she got off of the couch to go into the kitchen and noticed that her IV was on the floor. According to patient's records, she had a tunneled central line inserted on 07/28/2012 at [facility] for a constant infusion of Flolan for pulmonary HTN. Currently states she has a slight burning sensation to the area where this was inserted and noticed a "bump". She is on 4 L of oxygen at home. Denies shortness of breath, wheezing or chest pain. States her legs are beginning to swell since the medication has stopped.  The history is provided by the patient.    Past Medical History  Diagnosis Date  . OSA (obstructive sleep apnea)     CPAP  . Venous stasis ulcer     chornic, ?followed up at wound care center, multiple courses of antibiotics in past for cellulitis, on lasix  . GERD (gastroesophageal reflux disease)   . Anemia, iron deficiency     secondary to menhorrhagia, on oral iron, also b12 def, getting monthly b12 shots  . Chronic cough     secondary to alleriges and post nasal drip  . Depression   . Diabetes mellitus     well controlled on metformin  . Hyperlipidemia   . H/O mental retardation   . Herpes   . Cor pulmonale     PA Peak pressure 52mHg  . Diastolic heart failure   . Restrictive lung disease     PFTs 06/2012 (FVC 54% predicted and FEV1 68% predicted w minimal bronchodilator response).  . CHF (congestive heart failure)   . Renal disorder   . Pulmonary hypertension   . Hypertension   .  Shortness of breath   . COPD (chronic obstructive pulmonary disease)    Past Surgical History  Procedure Laterality Date  . Cholecystectomy     Family History  Problem Relation Age of Onset  . Mental illness Sister   . Mental retardation Brother    History  Substance Use Topics  . Smoking status: Never Smoker   . Smokeless tobacco: Never Used  . Alcohol Use: No   OB History   Grav Para Term Preterm Abortions TAB SAB Ect Mult Living   _0 Review of Systems  Constitutional: Negative for activity change.  Respiratory: Negative for shortness of breath.   Cardiovascular: Positive for leg swelling. Negative for chest pain.  All other systems reviewed and are negative.    Allergies  Aspirin; Codeine; Lisinopril; and Sulfonamide derivatives  Home Medications   Current Outpatient Rx  Name  Route  Sig  Dispense  Refill  . albuterol (PROAIR HFA) 108 (90 BASE) MCG/ACT inhaler   Inhalation   Inhale 2 puffs into the lungs every 6 (six) hours as needed. Inhale 2 inhalations into the lungs every 6 (six) hours as needed for Wheezing.         . benzonatate (TESSALON) 100 MG capsule   Oral   Take 1 capsule (100 mg total) by  mouth 2 (two) times daily as needed for cough. Take 100 mg by mouth 3 (three) times daily as needed for Cough.   20 capsule   0   . fluticasone (FLONASE) 50 MCG/ACT nasal spray   Nasal   Place 2 sprays into the nose daily.          Marland Kitchen gabapentin (NEURONTIN) 600 MG tablet   Oral   Take 600 mg by mouth 3 (three) times daily.          Marland Kitchen ketoconazole (NIZORAL) 2 % cream   Topical   Apply topically. Apply topically 2 (two) times daily.         Marland Kitchen lidocaine (XYLOCAINE) 5 % ointment   Topical   Apply topically. Apply topically 3 (three) times daily as needed.         . loratadine (CLARITIN) 10 MG tablet   Oral   Take 10 mg by mouth daily as needed for allergies. Take 10 mg by mouth daily.         . metFORMIN (GLUCOPHAGE) 1000 MG  tablet   Oral   Take 0.5 tablets (500 mg total) by mouth 2 (two) times daily with a meal.   60 tablet   5   . ondansetron (ZOFRAN) 4 MG tablet   Oral   Take 4 mg by mouth every 8 (eight) hours as needed for nausea.         Marland Kitchen OVER THE COUNTER MEDICATION   Oral   Take 1 tablet by mouth daily as needed (diarrhea).         . pantoprazole (PROTONIX) 20 MG tablet   Oral   Take 1 tablet (20 mg total) by mouth 2 (two) times daily. Take 20 mg by mouth 2 (two) times daily.   60 tablet   6   . potassium chloride SA (K-DUR,KLOR-CON) 20 MEQ tablet   Oral   Take 2 tablets (40 mEq total) by mouth 2 (two) times daily.   360 tablet   3   . pravastatin (PRAVACHOL) 20 MG tablet   Oral   Take 1 tablet (20 mg total) by mouth daily.   30 tablet   6   . Rivaroxaban (XARELTO) 20 MG TABS tablet   Oral   Take 1 tablet (20 mg total) by mouth daily with supper.   30 tablet   1   . torsemide (DEMADEX) 20 MG tablet   Oral   Take 4 tablets (80 mg total) by mouth daily.   160 tablet   6   . vitamin B-12 (CYANOCOBALAMIN) 500 MCG tablet   Oral   Take 250 mcg by mouth daily.          BP 96/61  Pulse 93  Temp(Src) 98.6 F (37 C) (Oral)  Resp 20  SpO2 99% Physical Exam  Nursing note and vitals reviewed. Constitutional: She is oriented to person, place, and time. She appears well-developed and well-nourished. No distress. Nasal cannula in place.  HENT:  Head: Normocephalic and atraumatic.  Mouth/Throat: Oropharynx is clear and moist.  Eyes: Conjunctivae are normal.  Neck: Normal range of motion. Neck supple. No JVD present.  Cardiovascular: Normal rate, regular rhythm, normal heart sounds and intact distal pulses.   +1 pitting edema lower extremities bilateral  Pulmonary/Chest: Effort normal. No respiratory distress. She has wheezes (scant mild expiratory wheezes). She has no rales.  Abdominal: Soft. Bowel sounds are normal. There is no tenderness.  Musculoskeletal: Normal range of  motion. She  exhibits no edema.  Neurological: She is alert and oriented to person, place, and time.  Skin: Skin is warm and dry. She is not diaphoretic.  Psychiatric: She has a normal mood and affect. Her behavior is normal.    ED Course  Procedures (including critical care time) Labs Review Labs Reviewed - No data to display Imaging Review Dg Chest 2 View  10/09/2012   CLINICAL DATA:  Initial encounter for burning and itching rash involving the previous catheter insertion site in the right upper chest.  EXAM: CHEST  2 VIEW  COMPARISON:  Portable chest x-rays 08/16/2012 dating back to 02/11/2012. Two-view chest x-ray 03/20/2008.  FINDINGS: Cardiac silhouette enlarged but stable. Hilar and mediastinal contours otherwise unremarkable. Lungs clear. Bronchovascular markings normal. Pulmonary vascularity normal. No visible pleural effusions. No pneumothorax. Mild pectus excavatum sternal deformity. Degenerative changes and DISH throughout the thoracic spine. No significant interval change.  IMPRESSION: Stable cardiomegaly. No acute cardiopulmonary disease.   Electronically Signed   By: Evangeline Dakin M.D.   On: 10/09/2012 17:44    MDM   1. Status post peripherally inserted central catheter (PICC) central line placement    Patient with broken central line which she receives IV Flolan constantly through. I spoke with cardiology on-call who felt PICC line insertion was appropriate for this patient to receive medicine until proper placement central line can be placed. Interventional radiology unable to place central line this evening. I also spoke with Dr. Genelle Gather, pulmonology fellow at [facility] who states PICC line placement for this medication at home is perfectly appropriate. Also states that patient can be discharged home as long as she is feeling well. She will call [facility] first thing in the morning to schedule an appointment. PICC line placed in the ED, medication  running through well. Patient is feeling fine, able to ambulate and comfortable going home. Vitals stable, stable for discharge. Case discussed with my attending Dr. Sabra Heck who also evaluated patient and agrees with plan of care.     Illene Labrador, PA-C 10/09/12 2005

## 2012-10-09 NOTE — ED Notes (Signed)
Patient states her son in son the way to pick her up. Should be no longer than 15 minutes.

## 2012-10-09 NOTE — Progress Notes (Signed)
MEDICATION RELATED CONSULT NOTE - INITIAL   Pharmacy Consult for remodulin Indication: restart home therapy  Patient is on remodulin therapy prior to admission per patient and [facility] records she is currently on 80ng/kg/min based on 94kg dosing weight (22m/24hrs). She unfortunately pulled out picc line this afternoon and had new picc placed in ED. I worked with her nurse and we have restarted remodulin infusion per her CADD pump. Based on pump readings she should have ~36 hours of medication in current cassette (67m. Cassettes are changed every 48 hours.  FrErin HearingharmD., BCPS Clinical Pharmacist Pager [phone]/06/2012 8:20 PM

## 2012-10-09 NOTE — ED Notes (Signed)
Pt refused [facility] transport and states her son is on the way to come get her.

## 2012-10-09 NOTE — Progress Notes (Signed)
Patient c/o burning/pain at old Hickman site - vaseline gauze dressing to site placed by Laural Roes. Noted some soft edema over right clavicular area - made RN and PA-C aware of edema. PICC placed using ECG without difficulty.

## 2012-10-09 NOTE — Progress Notes (Signed)
Peripherally Inserted Central Catheter/Midline Placement  The IV Nurse has discussed with the patient and/or persons authorized to consent for the patient, the purpose of this procedure and the potential benefits and risks involved with this procedure.  The benefits include less needle sticks, lab draws from the catheter and patient may be discharged home with the catheter.  Risks include, but not limited to, infection, bleeding, blood clot (thrombus formation), and puncture of an artery; nerve damage and irregular heat beat.  Alternatives to this procedure were also discussed.  PICC/Midline Placement Documentation        [name], [name] 10/09/2012, 6:19 PM

## 2012-10-09 NOTE — ED Provider Notes (Signed)
Pt is on a constant drip of a cardiac med for her pulmonary hypertension - she has accidentally had her central line removed today - she is no longer getting her medicines and is feeling increased SOB.  On exam she has clear lungs, mild tacypnea, mild peripheral edema, mild murmur.  Speaks in full sentences.    PICC able to be placed - [name] d/w physicians at [facility] who agree with plan to send home and f/u for tunnelled catheter this week.  Medical screening examination/treatment/procedure(s) were conducted as a shared visit with non-physician practitioner(s) and myself.  I personally evaluated the patient during the encounter.  Clinical Impression: Shortness of breath - catheter related compication.      Johnna Acosta, MD 10/09/12 [phone]

## 2012-10-18 ENCOUNTER — Other Ambulatory Visit: Payer: Self-pay | Admitting: Internal Medicine

## 2012-10-22 MED ORDER — FREESTYLE SYSTEM KIT: 1.0000 | PACK | Status: DC | PRN

## 2012-10-22 MED ORDER — GLUCOSE BLOOD VI STRP: ORAL_STRIP | Status: DC

## 2012-10-22 MED ORDER — FREESTYLE LANCETS MISC: Status: DC

## 2012-10-23 ENCOUNTER — Other Ambulatory Visit: Payer: Self-pay | Admitting: *Deleted

## 2012-10-23 MED ORDER — ACCU-CHEK AVIVA DEVI: Status: DC

## 2012-10-23 MED ORDER — GLUCOSE BLOOD VI STRP: ORAL_STRIP | Status: DC

## 2012-10-23 NOTE — Telephone Encounter (Signed)
Fax from [place] is not covered by Medicaid, needs to be changed to [place] which is covered.  Please check directions  Thanks

## 2012-10-25 ENCOUNTER — Telehealth (HOSPITAL_COMMUNITY): Payer: Self-pay | Admitting: Cardiology

## 2012-10-25 NOTE — Telephone Encounter (Signed)
Pt called with concerns of bleeding at PICC line site Recent dressing change on  10/20 and since dressing change site has been bleeding  Per Esau Grew, RN will contact [facility] to have nurse come out to do a prn visit today

## 2012-11-08 ENCOUNTER — Telehealth (HOSPITAL_COMMUNITY): Payer: Self-pay | Admitting: *Deleted

## 2012-11-08 NOTE — Telephone Encounter (Signed)
Received call from [facility] with paramedicine, she states pt wt was 196 2 days ago and yesterday went to 201 and 201 again today, she reports pt's breathing is the same no increased SOB, she does have "puffiness" to LE pt is on torsemide 80 mg daily and kcl 40 meq bid, advised pt to take extra 20 mg torsemide and 20 meq of kcl today, if wt not down tomorrow repeat, pt has appt with [place] on 11/11, if wt not coming down pt will call us back

## 2012-11-14 ENCOUNTER — Encounter (HOSPITAL_COMMUNITY): Payer: Self-pay

## 2012-11-14 ENCOUNTER — Telehealth (HOSPITAL_COMMUNITY): Payer: Self-pay | Admitting: Adult Health

## 2012-11-14 ENCOUNTER — Ambulatory Visit (HOSPITAL_COMMUNITY)
Admission: RE | Admit: 2012-11-14 | Discharge: 2012-11-14 | Disposition: A | Discharge status: Home / Self Care | Payer: Medicaid Other | Source: Ambulatory Visit | Attending: Internal Medicine | Admitting: Internal Medicine

## 2012-11-14 VITALS — BP 86/52 | HR 90 | Wt 208.0 lb

## 2012-11-14 DIAGNOSIS — G4733 Obstructive sleep apnea (adult) (pediatric): Secondary | ICD-10-CM | POA: Insufficient documentation

## 2012-11-14 DIAGNOSIS — I5032 Chronic diastolic (congestive) heart failure: Secondary | ICD-10-CM | POA: Insufficient documentation

## 2012-11-14 DIAGNOSIS — E119 Type 2 diabetes mellitus without complications: Secondary | ICD-10-CM | POA: Insufficient documentation

## 2012-11-14 DIAGNOSIS — K219 Gastro-esophageal reflux disease without esophagitis: Secondary | ICD-10-CM | POA: Insufficient documentation

## 2012-11-14 DIAGNOSIS — J4489 Other specified chronic obstructive pulmonary disease: Secondary | ICD-10-CM | POA: Insufficient documentation

## 2012-11-14 DIAGNOSIS — I2789 Other specified pulmonary heart diseases: Secondary | ICD-10-CM

## 2012-11-14 DIAGNOSIS — I272 Pulmonary hypertension, unspecified: Secondary | ICD-10-CM

## 2012-11-14 DIAGNOSIS — I10 Essential (primary) hypertension: Secondary | ICD-10-CM | POA: Insufficient documentation

## 2012-11-14 DIAGNOSIS — J449 Chronic obstructive pulmonary disease, unspecified: Secondary | ICD-10-CM | POA: Insufficient documentation

## 2012-11-14 DIAGNOSIS — E785 Hyperlipidemia, unspecified: Secondary | ICD-10-CM | POA: Insufficient documentation

## 2012-11-14 DIAGNOSIS — I279 Pulmonary heart disease, unspecified: Secondary | ICD-10-CM | POA: Insufficient documentation

## 2012-11-14 DIAGNOSIS — I4892 Unspecified atrial flutter: Secondary | ICD-10-CM | POA: Insufficient documentation

## 2012-11-14 DIAGNOSIS — I509 Heart failure, unspecified: Secondary | ICD-10-CM | POA: Insufficient documentation

## 2012-11-14 LAB — BASIC METABOLIC PANEL
Calcium: 9.7 mg/dL (ref 8.4–10.5)
Chloride: 100 mEq/L (ref 96–112)
GFR calc non Af Amer: >90 mL/min (ref >90)
Glucose, Bld: 126 mg/dL — ABNORMAL HIGH (ref 70–99)
Potassium: 3.4 mEq/L — ABNORMAL LOW (ref 3.5–5.1)
Sodium: 139 mEq/L (ref 135–145)

## 2012-11-14 MED ORDER — POTASSIUM CHLORIDE CRYS ER 20 MEQ PO TBCR: 40.0000 meq | EXTENDED_RELEASE_TABLET | Freq: Three times a day (TID) | ORAL | Status: DC

## 2012-11-14 NOTE — Progress Notes (Signed)
[name], female   DOB: 01-16-1960, 52 y.o.   MRN: [id number]   Weight Range  200-202 pounds    Baseline proBNP   Pulmonology:  Cardiologist: Dr Kelby Aline   HPI: [name] is a 52 y.o. 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   She addmitted to [facility] in July with respiratory failure and RHC showed severe PAH with pulmonary pressures in the 90s. She was transferred to [facility] where she was started on Remodulin with excellent results.  07/2012 On dopamine 7.5 mcg/kg/min  RA 20  PAP 95/49  PCWP 15  SVR 2067  PVR 16.6 [place]  CO/CI 2.9/1.38  She was admitted again 08/16/12 for CP and tachycardia, was found to be in atypical flutter vs atrial tach. Amiodarone was considered but she converted spontaneously. She was started on Xarelto. Her discharge weight was 215 lbs.     Labs        09/29/12: K+ 3.6, Creatinine 0.87  She returns for follow up. Continues to follow with the [facility]. Doing well with Remodulin at 42ng. Could not tolerate higher doses due to rash. Last visit here torsemide was increased to 100 mg bid for 3 days then she went back down to 80 mg bid . Weight at home 200- 202 pounds. Overall she says her breathing has been ok. Denies SOB/PND/Orhtopnea. Denies presyncope. No bleeding problems. Complains of bilateral knee pain. Wears 4 L O2 chronically. Uses CPAP nightly. Taking medications as prescribed.  [facility] following. Her son performs hickman catheter dressing changes.     ROS: All systems negative except as listed in HPI, PMH and Problem List.  Past Medical History  Diagnosis Date  . OSA (obstructive sleep apnea)     CPAP  . Venous stasis ulcer     chornic, ?followed up at wound care center, multiple courses  of antibiotics in past for cellulitis, on lasix  . GERD (gastroesophageal reflux disease)   . Anemia, iron deficiency     secondary to menhorrhagia, on oral iron, also b12 def, getting monthly b12 shots  . Chronic cough     secondary to alleriges and post nasal drip  . Depression   . Diabetes mellitus     well controlled on metformin  . Hyperlipidemia   . H/O mental retardation   . Herpes   . Cor pulmonale     PA Peak pressure 44mHg  . Diastolic heart failure   . Restrictive lung disease     PFTs 06/2012 (FVC 54% predicted and FEV1 68% predicted w minimal bronchodilator response).  . CHF (congestive heart failure)   . Renal disorder   . Pulmonary hypertension   . Hypertension   . Shortness of breath   . COPD (chronic obstructive pulmonary disease)     Current Outpatient Prescriptions  Medication Sig Dispense Refill  . albuterol (PROAIR HFA) 108 (90 BASE) MCG/ACT inhaler Inhale 2 puffs into the lungs every 6 (six) hours as needed. Inhale 2 inhalations into the lungs every 6 (six) hours as needed for Wheezing.      . benzonatate (TESSALON) 100 MG capsule Take 1 capsule (100 mg total) by mouth 2 (two) times daily as needed for cough. Take 100 mg by mouth 3 (three) times daily as needed for Cough.  20 capsule  0  . Blood Glucose Monitoring Suppl (ACCU-CHEK AVIVA) device Use to check blood sugars daily. Dx code: 250.60.  1 each  0  . fluticasone (FLONASE) 50 MCG/ACT nasal spray Place 2 sprays into the nose daily.       Marland Kitchen gabapentin (NEURONTIN) 600 MG tablet Take 600 mg by mouth 3 (three) times daily.       Marland Kitchen glucose blood (ACCU-CHEK AVIVA) test strip Use to check blood sugars daily. Dx code: 250.60.  100 each  12  . glucose blood (FREESTYLE TEST STRIPS) test strip Use as instructed  100 each  12  . glucose monitoring kit (FREESTYLE) monitoring kit 1 each by Does not apply route as needed for other.  1 each  0  . Lancets (FREESTYLE) lancets Use as instructed  100 each  12  . lidocaine  (XYLOCAINE) 5 % ointment Apply topically. Apply topically 3 (three) times daily as needed.      . loratadine (CLARITIN) 10 MG tablet Take 10 mg by mouth daily as needed for allergies. Take 10 mg by mouth daily.      . metFORMIN (GLUCOPHAGE) 1000 MG tablet Take 0.5 tablets (500 mg total) by mouth 2 (two) times daily with a meal.  60 tablet  5  . ondansetron (ZOFRAN) 4 MG tablet Take 4 mg by mouth every 8 (eight) hours as needed for nausea.      . pantoprazole (PROTONIX) 20 MG tablet Take 1 tablet (20 mg total) by mouth 2 (two) times daily. Take 20 mg by mouth 2 (two) times daily.  60 tablet  6  . potassium chloride SA (K-DUR,KLOR-CON) 20 MEQ tablet Take 2 tablets (40 mEq total) by mouth 2 (two) times daily.  360 tablet  3  . pravastatin (PRAVACHOL) 20 MG tablet Take 1 tablet (20 mg total) by mouth daily.  30 tablet  6  . Rivaroxaban (XARELTO) 20 MG TABS tablet Take 1 tablet (20 mg total) by mouth daily with supper.  30 tablet  1  . sodium chloride 0.9 % SOLN with treprostinil 1 MG/ML SOLN Inject 80 ng/kg/min into the vein continuous. Dose 80ng/kg/min based on dosing wt of 94kg. 2m/24hours      . torsemide (DEMADEX) 20 MG tablet Take 4 tablets (80 mg total) by mouth daily.  160 tablet  6  . vitamin B-12 (CYANOCOBALAMIN) 500 MCG tablet Take 250 mcg by mouth daily.       No current facility-administered medications for this encounter.    Filed Vitals:   11/14/12 0916  BP: 86/52  Pulse: 90  Weight: 208 lb (94.348 kg)  SpO2: 100%    Physical Exam:  General: Morbidly obese. On chronic 3 L O2 used cane to ambulate into clinic.  HEENT: normal  Neck: supple. JVP difficult to assess d/t body habitus, but does not appear  elevated  Carotids 2+ bilat; no bruits. No lymphadenopathy or thryomegaly appreciated.  Cor: PMI nonpalpable. RRR 2/6 TR increased P2  Hickman catheter site OK Lungs: clear Abdomen: obese soft, nontender, nondistended. No hepatosplenomegaly. No bruits or masses. Good bowel  sounds.  Extremities: no cyanosis, clubbing, rash, trace edema; TED hose intact Neuro: alert & orientedx3, cranial nerves grossly intact. moves all 4 extremities w/o difficulty. Affect pleasant    ASSESSMENT & PLAN:  1) Chronic diastolic HF/cor pulmonale: LV EF 60-65% RV mod reduced.  NYHA II   - Volume status stable. Continue 80 mg torsemide daily and 40 meq BID KCl.  - Reinforced  the need and importance of daily weights, a low sodium diet, and fluid restriction (less than 2 L a day). Instructed to call the HF clinic if weight increases more than 3 lbs overnight or 5 lbs in a week.  Check BMET today  2) PAH, severe with cor pulmonale.  Patient has restrictive PFTs and OSA which likely contribute.  Possible primary pulmonary HTN.  - On treprostinil IV infusion through hickman cath - follwoed by Dr. Gilles Chiquito at [facility]. Has follow up in December.    3) Paroxysmal atrial flutter.  Remains in NSR. No evidence of recurrence. Continue Xarelto.  Follow up in 3 months  CLEGG,AMY NP-C 9:21 AM  Patient seen and examined with Darrick Grinder, NP. We discussed all aspects of the encounter. I agree with the assessment and plan as stated above.   Overall stable. NYHA II-III. Doing well with IV trepostinil. Follows with [facility]. May need combination therapy at some point. Volume status looks ok. Reinforced need for daily weights and reviewed use of sliding scale diuretics. No evidence of recurrent AFL. Continue Xarelto for anticoagulation.   Gustavus Haskin,MD 10:01 AM

## 2012-11-14 NOTE — Telephone Encounter (Signed)
Provided with lab results.   K 3.4 creatinine 0.73.   Instructed to increase potassium to 40 meq tid.   Ms [name] verbalized understanding.   Jonan Seufert 4:30 PM

## 2012-11-14 NOTE — Patient Instructions (Signed)
Follow up in 3 months   Do the following things EVERYDAY: 1) Weigh yourself in the morning before breakfast. Write it down and keep it in a log. 2) Take your medicines as prescribed 3) Eat low salt foods-Limit salt (sodium) to 2000 mg per day.  4) Stay as active as you can everyday 5) Limit all fluids for the day to less than 2 liters  

## 2012-11-23 ENCOUNTER — Other Ambulatory Visit (HOSPITAL_COMMUNITY): Payer: Self-pay | Admitting: *Deleted

## 2012-11-23 MED ORDER — POTASSIUM CHLORIDE CRYS ER 20 MEQ PO TBCR: 40.0000 meq | EXTENDED_RELEASE_TABLET | Freq: Three times a day (TID) | ORAL | Status: DC

## 2012-12-06 ENCOUNTER — Other Ambulatory Visit: Payer: Self-pay | Admitting: Internal Medicine

## 2012-12-11 ENCOUNTER — Other Ambulatory Visit: Payer: Self-pay | Admitting: Internal Medicine

## 2013-01-19 ENCOUNTER — Encounter (HOSPITAL_COMMUNITY): Payer: Self-pay | Admitting: Emergency Medicine

## 2013-01-19 ENCOUNTER — Emergency Department (HOSPITAL_COMMUNITY): Payer: Medicaid Other

## 2013-01-19 ENCOUNTER — Emergency Department (HOSPITAL_COMMUNITY)
Admission: EM | Admit: 2013-01-19 | Discharge: 2013-01-19 | Disposition: A | Discharge status: Home / Self Care | Payer: Medicaid Other | Attending: Emergency Medicine | Admitting: Emergency Medicine

## 2013-01-19 DIAGNOSIS — I10 Essential (primary) hypertension: Secondary | ICD-10-CM | POA: Insufficient documentation

## 2013-01-19 DIAGNOSIS — Y832 Surgical operation with anastomosis, bypass or graft as the cause of abnormal reaction of the patient, or of later complication, without mention of misadventure at the time of the procedure: Secondary | ICD-10-CM | POA: Insufficient documentation

## 2013-01-19 DIAGNOSIS — Z87448 Personal history of other diseases of urinary system: Secondary | ICD-10-CM | POA: Insufficient documentation

## 2013-01-19 DIAGNOSIS — E119 Type 2 diabetes mellitus without complications: Secondary | ICD-10-CM | POA: Insufficient documentation

## 2013-01-19 DIAGNOSIS — K219 Gastro-esophageal reflux disease without esophagitis: Secondary | ICD-10-CM | POA: Insufficient documentation

## 2013-01-19 DIAGNOSIS — IMO0002 Reserved for concepts with insufficient information to code with codable children: Secondary | ICD-10-CM | POA: Insufficient documentation

## 2013-01-19 DIAGNOSIS — E785 Hyperlipidemia, unspecified: Secondary | ICD-10-CM | POA: Insufficient documentation

## 2013-01-19 DIAGNOSIS — J449 Chronic obstructive pulmonary disease, unspecified: Secondary | ICD-10-CM | POA: Insufficient documentation

## 2013-01-19 DIAGNOSIS — J4489 Other specified chronic obstructive pulmonary disease: Secondary | ICD-10-CM | POA: Insufficient documentation

## 2013-01-19 DIAGNOSIS — T82598A Other mechanical complication of other cardiac and vascular devices and implants, initial encounter: Secondary | ICD-10-CM | POA: Insufficient documentation

## 2013-01-19 DIAGNOSIS — I5030 Unspecified diastolic (congestive) heart failure: Secondary | ICD-10-CM | POA: Insufficient documentation

## 2013-01-19 DIAGNOSIS — Z95828 Presence of other vascular implants and grafts: Secondary | ICD-10-CM

## 2013-01-19 DIAGNOSIS — Z8619 Personal history of other infectious and parasitic diseases: Secondary | ICD-10-CM | POA: Insufficient documentation

## 2013-01-19 DIAGNOSIS — Z8659 Personal history of other mental and behavioral disorders: Secondary | ICD-10-CM | POA: Insufficient documentation

## 2013-01-19 DIAGNOSIS — Z79899 Other long term (current) drug therapy: Secondary | ICD-10-CM | POA: Insufficient documentation

## 2013-01-19 DIAGNOSIS — Z7901 Long term (current) use of anticoagulants: Secondary | ICD-10-CM | POA: Insufficient documentation

## 2013-01-19 DIAGNOSIS — Z862 Personal history of diseases of the blood and blood-forming organs and certain disorders involving the immune mechanism: Secondary | ICD-10-CM | POA: Insufficient documentation

## 2013-01-19 DIAGNOSIS — G4733 Obstructive sleep apnea (adult) (pediatric): Secondary | ICD-10-CM | POA: Insufficient documentation

## 2013-01-19 LAB — BASIC METABOLIC PANEL
BUN: 16 mg/dL (ref 6–23)
CALCIUM: 9.3 mg/dL (ref 8.4–10.5)
CHLORIDE: 98 meq/L (ref 96–112)
CO2: 27 meq/L (ref 19–32)
CREATININE: 0.66 mg/dL (ref 0.50–1.10)
GFR calc Af Amer: >90 mL/min (ref >90)
GFR calc non Af Amer: >90 mL/min (ref >90)
GLUCOSE: 84 mg/dL (ref 70–99)
Potassium: 3.8 mEq/L (ref 3.7–5.3)
Sodium: 137 mEq/L (ref 137–147)

## 2013-01-19 LAB — PROTIME-INR
INR: 1.17 (ref 0.00–1.49)
PROTHROMBIN TIME: 14.7 s (ref 11.6–15.2)

## 2013-01-19 LAB — CBC WITH DIFFERENTIAL/PLATELET
Basophils Absolute: 0 10*3/uL (ref 0.0–0.1)
Basophils Relative: 0 % (ref 0–1)
EOS PCT: 0 % (ref 0–5)
Eosinophils Absolute: 0 10*3/uL (ref 0.0–0.7)
HCT: 31.7 % — ABNORMAL LOW (ref 36.0–46.0)
HEMOGLOBIN: 10.5 g/dL — AB (ref 12.0–15.0)
LYMPHS PCT: 27 % (ref 12–46)
Lymphs Abs: 2.3 10*3/uL (ref 0.7–4.0)
MCH: 28.2 pg (ref 26.0–34.0)
MCHC: 33.1 g/dL (ref 30.0–36.0)
MCV: 85.2 fL (ref 78.0–100.0)
MONOS PCT: 8 % (ref 3–12)
Monocytes Absolute: 0.7 10*3/uL (ref 0.1–1.0)
NEUTROS ABS: 5.4 10*3/uL (ref 1.7–7.7)
Neutrophils Relative %: 65 % (ref 43–77)
Platelets: 505 10*3/uL — ABNORMAL HIGH (ref 150–400)
RBC: 3.72 MIL/uL — ABNORMAL LOW (ref 3.87–5.11)
RDW: 15.1 % (ref 11.5–15.5)
WBC: 8.4 10*3/uL (ref 4.0–10.5)

## 2013-01-19 LAB — TROPONIN I: Troponin I: <0.3 ng/mL (ref <0.30)

## 2013-01-19 LAB — APTT: APTT: 39 s — AB (ref 24–37)

## 2013-01-19 LAB — PRO B NATRIURETIC PEPTIDE: Pro B Natriuretic peptide (BNP): 141.3 pg/mL — ABNORMAL HIGH (ref 0–125)

## 2013-01-19 MED ORDER — TREPROSTINIL SODIUM 1 MG/ML IJ SOLN: 80.0000 ng/kg/min | INTRAMUSCULAR | Status: DC

## 2013-01-19 MED ORDER — TREPROSTINIL SODIUM 1 MG/ML IJ SOLN
42.0000 ng/kg/min | INTRAMUSCULAR | Status: DC
  Filled 2013-01-19: qty 11.5

## 2013-01-19 NOTE — ED Notes (Signed)
Meal tray delivered to patient.

## 2013-01-19 NOTE — ED Notes (Signed)
Returned from IR

## 2013-01-19 NOTE — ED Notes (Signed)
[facility] called at this time for transport with oxygen.

## 2013-01-19 NOTE — ED Provider Notes (Signed)
TIME SEEN: 9:31 AM  CHIEF COMPLAINT: Right Hickman catheter dislodged  HPI: Patient is a 53 year old female with a history mild mental retardation, diabetes, diastolic heart failure, pulmonary hypertension with pulmonary pressures in the 90s he was started on Remodulin July 2014 who presents to the emergency department after her right Hickman catheter became dislodged this morning. She initially had her Hickman catheter placed on 07/28/12. She was seen in the emergency department on 10/09/12 when her catheter became dislodged. She had a PICC line placed at that time for that she could continue receiving her continue her continuous cardiac medications. Patient was then seen again at [facility] on 10/30/12 and had a replacement of her right Hickman catheter. Patient reports that she woke up this morning and her catheter was completely dislodged. She's not sure when it came out. She states she is feeling fine with no chest pain or shortness of breath. No bleeding or pain at the site of the catheter. Patient reports she drink water at 8:30 this morning. She last ate at 7 PM last night.  ROS: See HPI Constitutional: no fever  Eyes: no drainage  ENT: no runny nose   Cardiovascular:  no chest pain  Resp: no SOB  GI: no vomiting GU: no dysuria Integumentary: no rash  Allergy: no hives  Musculoskeletal: no leg swelling  Neurological: no slurred speech ROS otherwise negative  PAST MEDICAL HISTORY/PAST SURGICAL HISTORY:  Past Medical History  Diagnosis Date  . OSA (obstructive sleep apnea)     CPAP  . Venous stasis ulcer     chornic, ?followed up at wound care center, multiple courses of antibiotics in past for cellulitis, on lasix  . GERD (gastroesophageal reflux disease)   . Anemia, iron deficiency     secondary to menhorrhagia, on oral iron, also b12 def, getting monthly b12 shots  . Chronic cough     secondary to alleriges and post nasal drip  . Depression   . Diabetes mellitus     well controlled  on metformin  . Hyperlipidemia   . H/O mental retardation   . Herpes   . Cor pulmonale     PA Peak pressure 11mHg  . Diastolic heart failure   . Restrictive lung disease     PFTs 06/2012 (FVC 54% predicted and FEV1 68% predicted w minimal bronchodilator response).  . CHF (congestive heart failure)   . Renal disorder   . Pulmonary hypertension   . Hypertension   . Shortness of breath   . COPD (chronic obstructive pulmonary disease)     MEDICATIONS:  Prior to Admission medications   Medication Sig Start Date End Date Taking? Authorizing Provider  albuterol (PROAIR HFA) 108 (90 BASE) MCG/ACT inhaler Inhale 2 puffs into the lungs every 6 (six) hours as needed. Inhale 2 inhalations into the lungs every 6 (six) hours as needed for Wheezing.    Historical Provider, MD  benzonatate (TESSALON) 100 MG capsule Take 1 capsule (100 mg total) by mouth 2 (two) times daily as needed for cough. Take 100 mg by mouth 3 (three) times daily as needed for Cough. 09/29/12   ECorky Sox MD  Blood Glucose Monitoring Suppl (ACCU-CHEK AVIVA) device Use to check blood sugars daily. Dx code: 250.60. 10/23/12 10/23/13  ECorky Sox MD  collagenase (SANTYL) ointment Apply 1 application topically daily.    Historical Provider, MD  fluticasone (FLONASE) 50 MCG/ACT nasal spray Place 2 sprays into the nose daily.     Historical Provider, MD  gabapentin (NEURONTIN) 600 MG tablet Take 600 mg by mouth 3 (three) times daily.  08/03/12 08/03/13  Historical Provider, MD  glucose blood (ACCU-CHEK AVIVA) test strip Use to check blood sugars daily. Dx code: 250.60. 10/23/12   Corky Sox, MD  glucose blood (FREESTYLE TEST STRIPS) test strip Use as instructed 10/22/12   Corky Sox, MD  glucose monitoring kit (FREESTYLE) monitoring kit 1 each by Does not apply route as needed for other. 10/22/12   Corky Sox, MD  ketoconazole (NIZORAL) 2 % cream Apply 1 application topically 2 (two) times daily.    Historical Provider, MD   Lancets (FREESTYLE) lancets Use as instructed 10/22/12   Corky Sox, MD  lidocaine (XYLOCAINE) 5 % ointment Apply topically. Apply topically 3 (three) times daily as needed. 08/03/12   Historical Provider, MD  loperamide (IMODIUM) 2 MG capsule Take by mouth as needed for diarrhea or loose stools.    Historical Provider, MD  loratadine (CLARITIN) 10 MG tablet Take 10 mg by mouth daily as needed for allergies. Take 10 mg by mouth daily.    Historical Provider, MD  loratadine (CLARITIN) 10 MG tablet TAKE ONE TABLET BY MOUTH ONCE DAILY 12/11/12   Corky Sox, MD  metFORMIN (GLUCOPHAGE) 1000 MG tablet Take 0.5 tablets (500 mg total) by mouth 2 (two) times daily with a meal. 09/29/12   Corky Sox, MD  metFORMIN (GLUCOPHAGE) 1000 MG tablet TAKE ONE TABLET BY MOUTH TWICE DAILY WITH MEALS 12/06/12   Corky Sox, MD  ondansetron (ZOFRAN) 4 MG tablet Take 4 mg by mouth every 8 (eight) hours as needed for nausea.    Historical Provider, MD  pantoprazole (PROTONIX) 20 MG tablet Take 1 tablet (20 mg total) by mouth 2 (two) times daily. Take 20 mg by mouth 2 (two) times daily. 09/08/12   Corky Sox, MD  potassium chloride SA (K-DUR,KLOR-CON) 20 MEQ tablet Take 2 tablets (40 mEq total) by mouth 3 (three) times daily. 11/23/12   Jolaine Artist, MD  pravastatin (PRAVACHOL) 20 MG tablet Take 1 tablet (20 mg total) by mouth daily. 09/01/12   Rande Brunt, NP  Rivaroxaban (XARELTO) 20 MG TABS tablet Take 1 tablet (20 mg total) by mouth daily with supper. 09/29/12   Corky Sox, MD  sodium chloride 0.9 % SOLN with treprostinil 1 MG/ML SOLN Inject 80 ng/kg/min into the vein continuous. Dose 80ng/kg/min based on dosing wt of 94kg. 83m/24hours    Historical Provider, MD  torsemide (DEMADEX) 20 MG tablet Take 4 tablets (80 mg total) by mouth daily. 10/03/12   ARande Brunt NP  vitamin B-12 (CYANOCOBALAMIN) 500 MCG tablet Take 250 mcg by mouth daily.    Historical Provider, MD    ALLERGIES:  Allergies  Allergen  Reactions  . Aspirin     REACTION: airway swelling  . Codeine     REACTION: tingling in lips and hard breathing - had reaction at dentist - states "I can't take certain kinds of codeine" - happened maybe 10 yr ago  . Lisinopril     Cough  . Sulfonamide Derivatives     REACTION: airway swelling    SOCIAL HISTORY:  History  Substance Use Topics  . Smoking status: Never Smoker   . Smokeless tobacco: Never Used  . Alcohol Use: No    FAMILY HISTORY: Family History  Problem Relation Age of Onset  . Mental illness Sister   . Mental retardation Brother  EXAM: BP 99/70  Pulse 71  Temp(Src) 99.1 F (37.3 C) (Oral)  Resp 14  Wt 211 lb (95.709 kg)  SpO2 100% CONSTITUTIONAL: Alert and oriented and responds appropriately to questions. Well-appearing; well-nourished HEAD: Normocephalic EYES: Conjunctivae clear, PERRL ENT: normal nose; no rhinorrhea; moist mucous membranes; pharynx without lesions noted NECK: Supple, no meningismus, no LAD  CARD: RRR; S1 and S2 appreciated; no murmurs, no clicks, no rubs, no gallops RESP: Normal chest excursion without splinting or tachypnea; breath sounds clear and equal bilaterally; no wheezes, no rhonchi, no rales, small wound to the right anterior chest wall that is hemostatic ABD/GI: Normal bowel sounds; non-distended; soft, non-tender, no rebound, no guarding BACK:  The back appears normal and is non-tender to palpation, there is no CVA tenderness EXT: Normal ROM in all joints; non-tender to palpation; no edema; normal capillary refill; no cyanosis    SKIN: Normal color for age and race; warm NEURO: Moves all extremities equally PSYCH: The patient's mood and manner are appropriate. Grooming and personal hygiene are appropriate.  MEDICAL DECISION MAKING: Patient here with vascular access problem. She has severe pulmonary hypertension. She is currently asymptomatic and hemodynamically stable. Will discuss with interventional radiology. Will  keep patient n.p.o. Will check labs, chest x-ray.  ED PROGRESS: Discuss with interventional radiology. They are unable to place a Hickman catheter given patient is on Xarelto. They will be able to place a PICC line.     11:00 AM  Spoke with Rich Reining, patient's pulmonologist at [facility]. She recommends that patient have this medication immediately started to her peripheral IV as she has limited amount of time before she becomes very sick. She reports it takes approximately 4 and half hours before patient began to rebound from this medication. She recommends starting immediately through peripheral access until PICC line can be placed. Have discussed with patient's nurse who is contacting the on-call pharmacy that is listed on patient's pump for instructions on how to start this medication) the line. Patient is still hemodynamically stable and asymptomatic.  Her labs are unremarkable. Troponin negative. BNP 141. Chest x-ray shows stable cardiomegaly.   1:00 PM  Pt had PICC line placed. She is receiving her infusion. She still asymptomatic and hemodynamically stable. Will continue to monitor but anticipate discharge home with close outpatient followup with [facility].  2:12 PM  Pt is still doing well and asymptomatic. Will discuss with Dr. Gilles Chiquito again for update and followup.  3:05 PM  Spoke with Crystal at [facility] who works with Dr. Gilles Chiquito.  Updated patient is doing well, received left-sided PICC line and is currently receiving her infusion. They will schedule transportation to have patient followup at [facility] to have her Hickman catheter placed. Will give patient return precautions and discharge home. She has her son at home who is currently managing her care.  EKG Interpretation    Date/Time:  Friday January 19 2013 12:51:11 EST Ventricular Rate:  74 PR Interval:  214 QRS Duration: 98 QT Interval:  442 QTC Calculation: 490 R Axis:   129 Text Interpretation:  Sinus rhythm Prolonged PR interval Right axis  deviation Low voltage, precordial leads Borderline T abnormalities, anterior leads Borderline prolonged QT interval No significant change since last tracing Confirmed by WARD  DO, KRISTEN ([id number]) on 01/19/2013 1:00:03 PM             [place], DO 01/19/13 1512

## 2013-01-19 NOTE — ED Notes (Signed)
Sec to order meal tray; pt understanding; declined anything at this time.

## 2013-01-19 NOTE — Progress Notes (Signed)
[facility] ED CM received report from [facility] ED CM to follow up in regards to possible  [facility] services. Pt presented to the ED after dislodging Hickman cath. Pt is on continuous remodulin drip for PH which is managed by [facility]. Pt has had a Lt upper arm PICC line placed today in IR. In room to speak with pt she repots that her son has been taught how to manage the drip and [place] from the remodulin home infusion program comes out to perform dsg changes. Pt has had HH servies previously with [facility].  I contacted Pearletha Forge at  [facility]. She states, that patient if patient is receiving Remodulin home infusion through [facility] they are more than likely managing the entire case. Spoke with patient for clarification, she states, they send a [facility] nurse out to perform the dsg changes and manage the infusion. Pt understands that she will continue with management of the Remodulin gtt as usual. She will  contact [place] to update them about the insertion of PICC line. Pt verbalized understanding teach back method used. Discussed with Roxanne Gates. No further ED CM needs identified.

## 2013-01-19 NOTE — Discharge Instructions (Signed)
Pulmonary Hypertension Pulmonary hypertension is high blood pressure within the arteries in your lungs (pulmonary arteries). It is different than having high blood pressure elsewhere in your body, such as blood pressure that is measured with a blood pressure cuff. Pulmonary hypertension makes it harder for blood to flow through the lungs. As a result, the heart must work harder to pump blood through the lungs, and it may be harder for you to breathe. Over time, this can weaken the heart muscle. Pulmonary hypertension is a serious condition and can be fatal.  CAUSES Many different medical conditions can cause pulmonary hypertension. They can be grouped into five different categories:  Group 1 Pulmonary hypertension caused by abnormal growth of small blood vessels in the lungs (pulmonary arterial hypertension). The abnormal blood vessel growth may have no known cause or may be:   Hereditary.  Caused by another disease such as a connective tissue disease (including lupus or scleroderma) or HIV.  Caused by certain drugs or toxins. Group 2 Pulmonary hypertension caused by weakness of the main chamber of the heart (left ventricle) or heart valve disease. Group 3 Pulmonary hypertension caused by lung disease or low oxygen levels. Causes in this group include:  Emphysema or chronic obstructive pulmonary disease (COPD).  Untreated sleep apnea.  Pulmonary fibrosis. Group 4 Pulmonary hypertension caused by blood clots in the lungs (pulmonary emboli).  Group 5 Other causes of pulmonary hypertension, such as sickle cell anemia, or a mix between multiple causes. SIGNS AND SYMPTOMS  Shortness of breath. You may notice shortness of breath with:  Activity such as walking.  No activity.  Tiredness and fatigue.  Dizziness or fainting.  Rapid heartbeat or feeling the heart flutter or skip a beat (palpitations).  Neck vein enlargement.  Bluish color to the lips and fingertips. DIAGNOSIS  Various  tests may be used to help diagnose pulmonary hypertension. These can include:  Chest X-ray.  Arterial blood gases. This test checks the oxygen level in your blood.  CT scans. This test can provide detailed images of your lungs.  Pulmonary function test. This test measures how much air your lungs can hold. It also tests how well air moves in and out of your lungs.  Electrocardiography. This test traces the electrical activity of your heart.  Echocardiography. This test is used to look at your heart in motion and how it functions.  Heart catheterization. This test can measure the pressure in your pulmonary artery and the right side of the heart.  Lung biopsy. A tissue sample is sometimes taken from the lung to check for underlying causes. TREATMENT Pulmonary hypertension has no cure. Treatment is to help relieve symptoms and slow the progress of the condition. Treatment can involve:  Medicines such as:  Blood pressure medicines.  Medicines to relax (dilate) the pulmonary blood vessels.  Diuretic medicines.  Blood thinning medicines.  Surgery. For severe pulmonary hypertension that does not respond to medical treatment, heart-lung or lung transplant may be needed. HOME CARE INSTRUCTIONS  Only take over-the-counter or prescription medicines as directed by your health care provider. Take all medicines exactly as instructed. Do not change or stop medicines without first checking with your health care provider.  Do not smoke.  Eat a healthy diet. Decrease how much salt (sodium) you eat by checking nutrition labels and using seasonings without salt. Talk to your health care provider or a dietitian about foods you should eat.  Stay as active as possible. Exercise as directed by your health care  provider. Talk to your health care provider about what type of exercise is safe for you.  Avoid high altitudes.  Avoid hot tubs and saunas.  Avoid becoming pregnant, if this applies. Talk  to your health care provider about safe methods of birth control.  Follow up with your health care provider as directed. SEEK IMMEDIATE MEDICAL CARE IF:  You have severe shortness of breath.  You develop chest pain or pressure.  You cough up blood.  You develop swelling of your feet or legs.  You have a significant increase in weight over 1 2 days. Document Released: 10/18/2006 Document Revised: 10/11/2012 Document Reviewed: 06/12/2012 [facility] Patient Information 2014 [place]. PICC Insertion, Care After Refer to this sheet in the next few weeks. These instructions provide you with information on caring for yourself after your procedure. Your health care provider may also give you more specific instructions. Your treatment has been planned according to current medical practices, but problems sometimes occur. Call your health care provider if you have any problems or questions after your procedure. WHAT TO EXPECT AFTER THE PROCEDURE After your procedure, it is typical to have the following:  Mild discomfort at the insertion site. This should not last more than a day. HOME CARE INSTRUCTIONS  Rest at home for the remainder of the day after the procedure.  You may bend your arm and move it freely. If your PICC is near or at the bend of your elbow, avoid activity with repeated motion at the elbow.  Avoid lifting heavy objects as instructed by your health care provider.  Avoid using a crutch with the arm on the same side as your PICC. You may need to use a walker. Bandage Care  Keep your PICC bandage (dressing) clean and dry to prevent infection.  Ask your health care provider when you may shower. To keep the dressing dry, cover the PICC with plastic wrap and tape before showering. If the dressing does become wet, replace it right after the shower.  Do not soak in the bath, swim, or use hot tubs when you have a PICC.  Change the PICC dressing as instructed by your health care  provider.  Change your PICC dressing if it becomes loose or wet. General PICC Care  Check the PICC insertion site daily for leakage, redness, swelling, or pain.  Flush the PICC as directed by your health care provider. Let your health care provider know right away if the PICC is difficult to flush or does not flush. Do not use force to flush the PICC.   Do not use a syringe that is less than 10 mL to flush the PICC.  Never pull or tug on the PICC.  Avoid blood pressure checks on the arm with the PICC.  Keep your PICC identification card with you at all times.  Do not take the PICC out yourself. Only a trained health care professional should remove the PICC.  SEEK MEDICAL CARE IF:  You have pain in your arm, ear, face, or teeth.  You have fever or chills.  You have drainage from the PICC insertion site.  You have redness or palpate a "cord" around the PICC insertion site.  You cannot flush the catheter. SEEK IMMEDIATE MEDICAL CARE IF:  You have swelling in the arm in which the PICC is inserted. Document Released: 10/11/2012 Document Reviewed: 08/28/2012 [facility] Patient Information 2014 [place], [place].

## 2013-01-19 NOTE — Progress Notes (Signed)
IR requested to place central venous access for pt on IV Remodulin. Had a right IJ hickman placed at [facility], but she inadvertently pulled it out this morning.  She is taking Xarelto and confirms she took her dose last pm. New tunneled Hickman cannot be placed until she held at least one dose of Xarelto, due to risk of bleeding complication.  Does not appear that she needs admission, therefore IR can offer PICC line placement to allow central access.  She can then be scheduled as outpt with [place] or with [facility] for new Tunneled hickman, with instructions to hold Xarelto 1 day prior to procedure date.  PICC line placement procedure, risks, complications discussed with pt. Labs reviewed. Consent signed in chart  Ascencion Dike PA-C Interventional Radiology 01/19/2013 11:08 AM

## 2013-01-19 NOTE — Procedures (Signed)
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 See PACS venogram report. Successful placement of single lumen PICC line to left basilic vein. Length 50cm Tip at lower SVC/RA No complications Ready for use.  Ascencion Dike PA-C Interventional Radiology 01/19/2013 12:24 PM

## 2013-01-19 NOTE — ED Notes (Signed)
PT ambulated with baseline gait; VSS; A&Ox3; no signs of distress; respirations even and unlabored; skin warm and dry; no questions upon discharge.  

## 2013-01-19 NOTE — ED Notes (Signed)
Pts remodulin switched from peripheral IV to PICC line. Pt given [place] sandwich and lunch tray ordered.

## 2013-01-19 NOTE — Progress Notes (Signed)
CARE MANAGEMENT ED NOTE 01/19/2013  Patient:  [name], [name]   Account Number:  [id number]  Date Initiated:  01/19/2013  Documentation initiated by:  Jackelyn Poling  Subjective/Objective Assessment:   53 yr old medicaid [place] access pt of [place] arrives via EMS from home with dislodged Hickman catheter to right chest, placed at [facility] for patients pulmonary HTN med     Subjective/Objective Assessment Detail:   Cm received a call from [facility] ED Rn to assist with coordination of care between her home health RN. CM heardpt iinforming Elmyra Ricks that she is [facility]  [facility] staff states pt is not active for home services nor iv services, last seen in october 2014 but has [facility] ( CPAP, O2 at 1109 01/19/13  Dr Leonides Schanz confirms pt will receive another 40 Son has been assisting pt at home for about 6 months with medication administration and home health may not be needed     Action/Plan:   CM spoke with ED RN, Elmyra Ricks and Cyril Mourning of [facility] Spoke with Dr Leonides Schanz EDP   Action/Plan Detail:   Further interventions pending further identified needs   Anticipated DC Date:       Status Recommendation to Physician:   Result of Recommendation:    Other ED Services  Consult [facility] [place]  Other    Choice offered to / List presented to:            Status of service:  Completed, signed off  ED Comments:   ED Comments Detail:

## 2013-01-19 NOTE — ED Notes (Signed)
Pt arrives via EMS from home with dislodged hickman catheter to right chest, placed at [facility] for patients pulmonary HTN. Pt alert, oriented x4, NAD, VSS. Denies pain at present.

## 2013-01-19 NOTE — ED Notes (Signed)
Restarted pts remodulin in peripheral IV per verbal order from Dr. Leonides Schanz.

## 2013-01-19 NOTE — ED Notes (Signed)
[facility] called for cause of delay; unable to tell me when they will be here.

## 2013-01-29 ENCOUNTER — Telehealth (HOSPITAL_COMMUNITY): Payer: Self-pay | Admitting: Cardiology

## 2013-01-29 NOTE — Telephone Encounter (Signed)
Pt called to request an increase in quantity for her torsemide Pt states she has been unable to get her weight below 204  (205, 209, 216, 205, 210, 204)   Pt given next available   Please advise

## 2013-01-29 NOTE — Telephone Encounter (Signed)
Spoke w/pt, she states wt is up and down, she states she gets confused on food labels and sodium content, she feels like that is why wt increases, but she gets it back down, Susie with [facility] is sch to see pt on Wed will have her help pt with labels, pt denies edema or increased SOB, wt is down now

## 2013-02-07 ENCOUNTER — Telehealth (HOSPITAL_COMMUNITY): Payer: Self-pay | Admitting: Cardiology

## 2013-02-07 NOTE — Telephone Encounter (Signed)
Spoke w/[facility] they state pt just needs refills, gave ok for 6 refills, pt aware

## 2013-02-07 NOTE — Telephone Encounter (Signed)
Pt states Xarelto, will require prior authorization Please advise

## 2013-02-12 ENCOUNTER — Ambulatory Visit (HOSPITAL_COMMUNITY)
Admission: RE | Admit: 2013-02-12 | Discharge: 2013-02-12 | Disposition: A | Discharge status: Home / Self Care | Payer: Medicaid Other | Source: Ambulatory Visit | Attending: Internal Medicine | Admitting: Internal Medicine

## 2013-02-12 VITALS — BP 98/60 | HR 95 | Wt 209.5 lb

## 2013-02-12 DIAGNOSIS — K219 Gastro-esophageal reflux disease without esophagitis: Secondary | ICD-10-CM | POA: Insufficient documentation

## 2013-02-12 DIAGNOSIS — I272 Pulmonary hypertension, unspecified: Secondary | ICD-10-CM

## 2013-02-12 DIAGNOSIS — Z79899 Other long term (current) drug therapy: Secondary | ICD-10-CM | POA: Insufficient documentation

## 2013-02-12 DIAGNOSIS — J4489 Other specified chronic obstructive pulmonary disease: Secondary | ICD-10-CM | POA: Insufficient documentation

## 2013-02-12 DIAGNOSIS — I4892 Unspecified atrial flutter: Secondary | ICD-10-CM

## 2013-02-12 DIAGNOSIS — I279 Pulmonary heart disease, unspecified: Secondary | ICD-10-CM | POA: Insufficient documentation

## 2013-02-12 DIAGNOSIS — I10 Essential (primary) hypertension: Secondary | ICD-10-CM | POA: Insufficient documentation

## 2013-02-12 DIAGNOSIS — I5032 Chronic diastolic (congestive) heart failure: Secondary | ICD-10-CM

## 2013-02-12 DIAGNOSIS — G4733 Obstructive sleep apnea (adult) (pediatric): Secondary | ICD-10-CM | POA: Insufficient documentation

## 2013-02-12 DIAGNOSIS — E119 Type 2 diabetes mellitus without complications: Secondary | ICD-10-CM | POA: Insufficient documentation

## 2013-02-12 DIAGNOSIS — I509 Heart failure, unspecified: Secondary | ICD-10-CM | POA: Insufficient documentation

## 2013-02-12 DIAGNOSIS — I2789 Other specified pulmonary heart diseases: Secondary | ICD-10-CM | POA: Insufficient documentation

## 2013-02-12 DIAGNOSIS — E785 Hyperlipidemia, unspecified: Secondary | ICD-10-CM | POA: Insufficient documentation

## 2013-02-12 DIAGNOSIS — J449 Chronic obstructive pulmonary disease, unspecified: Secondary | ICD-10-CM | POA: Insufficient documentation

## 2013-02-12 LAB — BASIC METABOLIC PANEL
BUN: 16 mg/dL (ref 6–23)
CHLORIDE: 96 meq/L (ref 96–112)
CO2: 29 mEq/L (ref 19–32)
Calcium: 9.2 mg/dL (ref 8.4–10.5)
Creatinine, Ser: 0.67 mg/dL (ref 0.50–1.10)
GFR calc Af Amer: >90 mL/min (ref >90)
Glucose, Bld: 68 mg/dL — ABNORMAL LOW (ref 70–99)
Potassium: 3.4 mEq/L — ABNORMAL LOW (ref 3.7–5.3)
SODIUM: 140 meq/L (ref 137–147)

## 2013-02-12 NOTE — Progress Notes (Signed)
[name], female   DOB: Jun 23, 1960, 53 y.o.   MRN: [id number]   Weight Range  200-202 pounds    Baseline proBNP   Pulmonology:  Cardiologist: Dr Kelby Aline   HPI: [name] is a 53 y.o. 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   She addmitted to [facility] in July with respiratory failure and RHC showed severe PAH with pulmonary pressures in the 90s. She was transferred to [facility] where she was started on Remodulin with excellent results.  07/2012 On dopamine 7.5 mcg/kg/min  RA 20  PAP 95/49  PCWP 15  SVR 2067  PVR 16.6 Woods  CO/CI 2.9/1.38  She was admitted again 08/16/12 for CP and tachycardia, was found to be in atypical flutter vs atrial tach. Amiodarone was considered but she converted spontaneously. She was started on Xarelto. Her discharge weight was 215 lbs.     Follow-up: She returns for follow up. Continues to follow with the [facility]. Last Friday was at [facility] and PICC was switched to Hickman. Due to f/u with Dr. Gilles Chiquito in April. Doing well with Remodulin at 42ng. Could not tolerate higher doses due to rash. Overall she says her breathing has been ok as long as she goes slow. Weight fluctuates 205-214. Takes torsemide 57m daily. Ups it to 1072mif weight is going up. Denies orthopnea, PND or palpitations. No bleeding with Xarelto. Wears 4 L O2 chronically. Uses CPAP nightly.  [facility] following. Her son performs hickman catheter dressing changes.    Labs        09/29/12: K+ 3.6, Creatinine 0.87  ROS: All systems negative except as listed in HPI, PMH and Problem List.  Past Medical History  Diagnosis Date  . OSA (obstructive sleep apnea)     CPAP  . Venous stasis ulcer     chornic, ?followed up at wound care center,  multiple courses of antibiotics in past for cellulitis, on lasix  . GERD (gastroesophageal reflux disease)   . Anemia, iron deficiency     secondary to menhorrhagia, on oral iron, also b12 def, getting monthly b12 shots  . Chronic cough     secondary to alleriges and post nasal drip  . Depression   . Diabetes mellitus     well controlled on metformin  . Hyperlipidemia   . H/O mental retardation   . Herpes   . Cor pulmonale     PA Peak pressure 8982m  . Diastolic heart failure   . Restrictive lung disease     PFTs 06/2012 (FVC 54% predicted and FEV1 68% predicted w minimal bronchodilator response).  . CHF (congestive heart failure)   . Renal disorder   . Pulmonary hypertension   . Hypertension   . Shortness of breath   . COPD (chronic obstructive pulmonary disease)     Current Outpatient Prescriptions  Medication Sig Dispense Refill  . albuterol (PROVENTIL HFA;VENTOLIN HFA) 108 (90 BASE) MCG/ACT inhaler Inhale 2 puffs into the lungs every 6 (six) hours as needed for wheezing or shortness of breath.      . Blood Glucose Monitoring Suppl (ACCU-CHEK AVIVA) device Use to check blood sugars daily. Dx code: 250.60.  1 each  0  . cyanocobalamin 500 MCG tablet Take 500 mcg by mouth daily.      .Marland Kitchen  fluticasone (FLONASE) 50 MCG/ACT nasal spray Place 2 sprays into the nose daily.       Marland Kitchen gabapentin (NEURONTIN) 300 MG capsule Take 600 mg by mouth 3 (three) times daily.      Marland Kitchen glucose blood (ACCU-CHEK AVIVA) test strip Use to check blood sugars daily. Dx code: 250.60.  100 each  12  . glucose blood (FREESTYLE TEST STRIPS) test strip Use as instructed  100 each  12  . glucose monitoring kit (FREESTYLE) monitoring kit 1 each by Does not apply route as needed for other.  1 each  0  . Lancets (FREESTYLE) lancets Use as instructed  100 each  12  . loperamide (IMODIUM) 2 MG capsule Take 2 mg by mouth daily as needed for diarrhea or loose stools.      Marland Kitchen loratadine (CLARITIN) 10 MG tablet TAKE ONE  TABLET BY MOUTH ONCE DAILY  30 tablet  3  . metFORMIN (GLUCOPHAGE) 1000 MG tablet Take 1,000 mg by mouth 2 (two) times daily with a meal.      . pantoprazole (PROTONIX) 20 MG tablet Take 20 mg by mouth 2 (two) times daily.      . potassium chloride SA (K-DUR,KLOR-CON) 20 MEQ tablet Take 40 mEq by mouth 3 (three) times daily. Take extra tab when take an extra Torsemide      . pravastatin (PRAVACHOL) 20 MG tablet Take 1 tablet (20 mg total) by mouth daily.  30 tablet  6  . Rivaroxaban (XARELTO) 20 MG TABS tablet Take 1 tablet (20 mg total) by mouth daily with supper.  30 tablet  1  . sodium chloride 0.9 % SOLN with treprostinil 1 MG/ML SOLN Inject 80 ng/kg/min into the vein continuous. Dose 80ng/kg/min based on dosing wt of 94kg. 1m/24hours      . torsemide (DEMADEX) 20 MG tablet Take 80 mg by mouth daily. Take extra tab as needed for weight gain       No current facility-administered medications for this encounter.    Filed Vitals:   02/12/13 1344  BP: 98/60  Pulse: 95  Weight: 209 lb 8 oz (95.029 kg)  SpO2: 95%    Physical Exam:  General: Morbidly obese. On chronic 3 L O2 used cane to ambulate into clinic.  HEENT: normal  Neck: supple. JVP difficult to assess d/t body habitus, but does not appear elevated  Carotids 2+ bilat; no bruits. No lymphadenopathy or thryomegaly appreciated.  Cor: PMI nonpalpable. Mildly irregular 2/6 TR increased P2  Hickman catheter site OK Lungs: clear Abdomen: obese soft, nontender, nondistended. No hepatosplenomegaly. No bruits or masses. Good bowel sounds.  Extremities: no cyanosis, clubbing, rash, trace edema; TED hose intact Neuro: alert & orientedx3, cranial nerves grossly intact. moves all 4 extremities w/o difficulty. Affect pleasant   ECG: SR with RVH  ASSESSMENT & PLAN:  1) Chronic diastolic HF/cor pulmonale: LV EF 60-65% RV mod reduced.  NYHA II   - Volume status stable. Reviewed use of sliding scale diuretics. - Reinforced the need and  importance of daily weights, a low sodium diet, and fluid restriction (less than 2 L a day). Instructed to call the HF clinic if weight increases more than 3 lbs overnight or 5 lbs in a week.  Check BMET today  2) PAH, severe with cor pulmonale.  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 However PAH far out of proportion to L-sided pressures or restrictive lung disease. - Overall stable. NYHA II-III. Doing well with IV trepostinil.  Follows with [facility] get f/u echo. If significant residual PAH or RV dysfunction can consider combination therapy.    3) Paroxysmal atrial flutter.  Remains in NSR. No evidence of recurrence. Continue Xarelto.  Follow up in 3 months  Benay Spice 1:55 PM

## 2013-02-12 NOTE — Patient Instructions (Signed)
Lab today  Your physician has requested that you have an echocardiogram. Echocardiography is a painless test that uses sound waves to create images of your heart. It provides your doctor with information about the size and shape of your heart and how well your heart's chambers and valves are working. This procedure takes approximately one hour. There are no restrictions for this procedure.  Your physician recommends that you schedule a follow-up appointment in: 2 months

## 2013-02-13 ENCOUNTER — Other Ambulatory Visit: Payer: Self-pay

## 2013-02-13 ENCOUNTER — Telehealth (HOSPITAL_COMMUNITY): Payer: Self-pay

## 2013-02-13 DIAGNOSIS — E1149 Type 2 diabetes mellitus with other diabetic neurological complication: Secondary | ICD-10-CM

## 2013-02-13 MED ORDER — RIVAROXABAN 20 MG PO TABS: 20.0000 mg | ORAL_TABLET | Freq: Every day | ORAL | Status: DC

## 2013-02-13 MED ORDER — SPIRONOLACTONE 25 MG PO TABS: 12.5000 mg | ORAL_TABLET | Freq: Every day | ORAL | Status: DC

## 2013-02-13 NOTE — Progress Notes (Signed)
Quick Note:  Patient called, instructed to take extra 40 of potassium, informed of new Rx spiro to take 12.38m daily, Rx sent electronically to preferred pharmacy. [facility] ______

## 2013-02-13 NOTE — Telephone Encounter (Signed)
Patient called with lab results, instrcuted to take extra 40 meq potassium, start spiro 12.5 mg daily-Rx sent to preferred pharmacy electronically, lab redraw scheduled for 2/18 at [facility].

## 2013-02-14 ENCOUNTER — Encounter (HOSPITAL_COMMUNITY): Payer: Self-pay

## 2013-02-21 ENCOUNTER — Ambulatory Visit (HOSPITAL_COMMUNITY): Admission: RE | Admit: 2013-02-21 | Payer: Medicaid Other | Source: Ambulatory Visit

## 2013-02-28 ENCOUNTER — Ambulatory Visit (HOSPITAL_COMMUNITY)
Admission: RE | Admit: 2013-02-28 | Discharge: 2013-02-28 | Disposition: A | Discharge status: Home / Self Care | Payer: Medicaid Other | Source: Ambulatory Visit | Attending: Internal Medicine | Admitting: Internal Medicine

## 2013-02-28 DIAGNOSIS — Z6838 Body mass index (BMI) 38.0-38.9, adult: Secondary | ICD-10-CM | POA: Insufficient documentation

## 2013-02-28 DIAGNOSIS — E785 Hyperlipidemia, unspecified: Secondary | ICD-10-CM | POA: Insufficient documentation

## 2013-02-28 DIAGNOSIS — I517 Cardiomegaly: Secondary | ICD-10-CM

## 2013-02-28 DIAGNOSIS — E669 Obesity, unspecified: Secondary | ICD-10-CM | POA: Insufficient documentation

## 2013-02-28 DIAGNOSIS — I509 Heart failure, unspecified: Secondary | ICD-10-CM | POA: Insufficient documentation

## 2013-02-28 DIAGNOSIS — I2789 Other specified pulmonary heart diseases: Secondary | ICD-10-CM | POA: Insufficient documentation

## 2013-02-28 DIAGNOSIS — F79 Unspecified intellectual disabilities: Secondary | ICD-10-CM | POA: Insufficient documentation

## 2013-02-28 DIAGNOSIS — I5032 Chronic diastolic (congestive) heart failure: Secondary | ICD-10-CM

## 2013-02-28 DIAGNOSIS — I4892 Unspecified atrial flutter: Secondary | ICD-10-CM | POA: Insufficient documentation

## 2013-02-28 NOTE — Progress Notes (Signed)
  Echocardiogram 2D Echocardiogram has been performed.  [name] [name] 02/28/2013, 3:35 PM

## 2013-03-13 ENCOUNTER — Telehealth (HOSPITAL_COMMUNITY): Payer: Self-pay | Admitting: Cardiology

## 2013-03-13 NOTE — Telephone Encounter (Signed)
Pt called with concerns of weight increase x 3-4 days Pt is unable to get weight below 214 lb, states she haws been slack on sodium intake Denies LE edema or SOB Please advise

## 2013-03-13 NOTE — Telephone Encounter (Signed)
Patient call returned regarding weight gain.  Patient states weight on 2/27 was 204.4 lbs and has slowly increased since then, final weight this morning 214.6 lbs.  Patient admits she has not followed her low sodium diet or fluid restriction for past week or two.  Denies any SOB, cough, edema, but says her abdomen feels tighter.  Advised with Amy Clegg NP-C, instructed patient to take 1 extra torsemide (100 mg total) and one extra potassium at lunch (40/60/40 meq) today.  Asked to weigh again same time and scale tomorrow and to update [place] with weight.  Instructed to call with any further questions/concerns. Renee Pain

## 2013-03-14 ENCOUNTER — Encounter (HOSPITAL_COMMUNITY): Payer: Self-pay

## 2013-03-14 ENCOUNTER — Telehealth (HOSPITAL_COMMUNITY): Payer: Self-pay

## 2013-03-14 NOTE — Telephone Encounter (Signed)
Patient returned call to update [place] since yesterday's call and instruction to take extra torsemide and potassium.  Patient states she is down 4 pounds.  Made appointment to be seent omorrow to make sure patient understands diet, sodium, and fluid restrictions, and to go over medications.

## 2013-03-14 NOTE — Progress Notes (Signed)
[name] stopped by to see patient on a routine visit.  Addressed issues patient had that she called [place] earlier about regarding weight gain and poor diet, sodium, and fluid control.  Susie reported that she went over these educational pieces with patient, said patient "looked good", ankles were slightly swollen, but said that patient took extra torsemide and potassium as reported and "felt better".  Gave patient her card with her contact number and asked patient to contact her with any questions or concerns and Susie would be happy to come see patient. [name]

## 2013-03-15 ENCOUNTER — Ambulatory Visit (HOSPITAL_COMMUNITY)
Admission: RE | Admit: 2013-03-15 | Discharge: 2013-03-15 | Disposition: A | Discharge status: Home / Self Care | Payer: Medicaid Other | Source: Ambulatory Visit | Attending: Internal Medicine | Admitting: Internal Medicine

## 2013-03-15 ENCOUNTER — Encounter (HOSPITAL_COMMUNITY): Payer: Self-pay

## 2013-03-15 VITALS — BP 96/60 | HR 103 | Wt 214.8 lb

## 2013-03-15 DIAGNOSIS — J449 Chronic obstructive pulmonary disease, unspecified: Secondary | ICD-10-CM | POA: Insufficient documentation

## 2013-03-15 DIAGNOSIS — I279 Pulmonary heart disease, unspecified: Secondary | ICD-10-CM

## 2013-03-15 DIAGNOSIS — I5033 Acute on chronic diastolic (congestive) heart failure: Secondary | ICD-10-CM

## 2013-03-15 DIAGNOSIS — I2781 Cor pulmonale (chronic): Secondary | ICD-10-CM

## 2013-03-15 DIAGNOSIS — R059 Cough, unspecified: Secondary | ICD-10-CM | POA: Insufficient documentation

## 2013-03-15 DIAGNOSIS — I4892 Unspecified atrial flutter: Secondary | ICD-10-CM | POA: Insufficient documentation

## 2013-03-15 DIAGNOSIS — I509 Heart failure, unspecified: Secondary | ICD-10-CM | POA: Insufficient documentation

## 2013-03-15 DIAGNOSIS — I272 Pulmonary hypertension, unspecified: Secondary | ICD-10-CM

## 2013-03-15 DIAGNOSIS — I2789 Other specified pulmonary heart diseases: Secondary | ICD-10-CM

## 2013-03-15 DIAGNOSIS — G4733 Obstructive sleep apnea (adult) (pediatric): Secondary | ICD-10-CM | POA: Insufficient documentation

## 2013-03-15 DIAGNOSIS — Z79899 Other long term (current) drug therapy: Secondary | ICD-10-CM | POA: Insufficient documentation

## 2013-03-15 DIAGNOSIS — E119 Type 2 diabetes mellitus without complications: Secondary | ICD-10-CM | POA: Insufficient documentation

## 2013-03-15 DIAGNOSIS — D509 Iron deficiency anemia, unspecified: Secondary | ICD-10-CM | POA: Insufficient documentation

## 2013-03-15 DIAGNOSIS — J984 Other disorders of lung: Secondary | ICD-10-CM | POA: Insufficient documentation

## 2013-03-15 DIAGNOSIS — F3289 Other specified depressive episodes: Secondary | ICD-10-CM | POA: Insufficient documentation

## 2013-03-15 DIAGNOSIS — Z791 Long term (current) use of non-steroidal anti-inflammatories (NSAID): Secondary | ICD-10-CM | POA: Insufficient documentation

## 2013-03-15 DIAGNOSIS — I5032 Chronic diastolic (congestive) heart failure: Secondary | ICD-10-CM

## 2013-03-15 DIAGNOSIS — F79 Unspecified intellectual disabilities: Secondary | ICD-10-CM | POA: Insufficient documentation

## 2013-03-15 DIAGNOSIS — J4489 Other specified chronic obstructive pulmonary disease: Secondary | ICD-10-CM | POA: Insufficient documentation

## 2013-03-15 DIAGNOSIS — E785 Hyperlipidemia, unspecified: Secondary | ICD-10-CM | POA: Insufficient documentation

## 2013-03-15 DIAGNOSIS — Z7901 Long term (current) use of anticoagulants: Secondary | ICD-10-CM | POA: Insufficient documentation

## 2013-03-15 DIAGNOSIS — I10 Essential (primary) hypertension: Secondary | ICD-10-CM | POA: Insufficient documentation

## 2013-03-15 DIAGNOSIS — K219 Gastro-esophageal reflux disease without esophagitis: Secondary | ICD-10-CM | POA: Insufficient documentation

## 2013-03-15 DIAGNOSIS — R05 Cough: Secondary | ICD-10-CM | POA: Insufficient documentation

## 2013-03-15 DIAGNOSIS — F329 Major depressive disorder, single episode, unspecified: Secondary | ICD-10-CM | POA: Insufficient documentation

## 2013-03-15 NOTE — Progress Notes (Signed)
6 min walk test completed, at start pt was 97% on 4 L HR 113, pt ambulated 1140 ft.  About 1/2 way through O2 sat dropped to 82% and oxygen was increased to 5 L, O2 sat came up to 89% and ranged 89-94% rest of test.

## 2013-03-15 NOTE — Progress Notes (Signed)
[name], female   DOB: November 07, 1960, 54 y.o.   MRN: [id number]   Weight Range  200-202 pounds    Baseline proBNP   Pulmonology:  Cardiologist: Dr Kelby Aline    HPI: [name] is a 53 y.o. 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   She addmitted to [facility] in July with respiratory failure and RHC showed severe PAH with pulmonary pressures in the 90s. She was transferred to [facility] where she was started on Remodulin with excellent results.  07/2012 On dopamine 7.5 mcg/kg/min  RA 20  PAP 95/49  PCWP 15  SVR 2067  PVR 16.6 Woods  CO/CI 2.9/1.38  She was admitted again 08/16/12 for CP and tachycardia, was found to be in atypical flutter vs atrial tach. Amiodarone was considered but she converted spontaneously. She was started on Xarelto. Her discharge weight was 215 lbs.      She returns for follow up. Continues to follow with the [facility].  Doing well with Remodulin at 42ng.Could not tolerate higher doses due to rash. Complains of allergies. Says she had a couple presyncope episodes. Weight fluctuates 205-214. On 03/01/13 she took  25 mg spironolactone but now she is taking 12.5 mg tablets. On March 10 and March 11 she took an extra 20 mg torsemide due to weight gain. Weight went down to 3-4 pounds. No PND or palpitations. No bleeding with Xarelto. Wears 4 L O2 chronically. Uses CPAP nightly.  [facility] following. Her son performs hickman catheter dressing changes.   ECHO 08/11/12 EF 60-65% RV moderately dilated Peak PA pressure 48 mmhg  ECHO 02/28/13 EF 55-60% Grade I DD RV severely dilated and HK. Trivial TR. Septum flat Peak PA pressure 53 mmhg  Labs        09/29/12: K+ 3.6, Creatinine 0.87       02/12/13 K 3.4 Creatinine 0.67 --->  given extra potassium and started on 12.5 mg spironolactone.    ROS: All systems negative except as listed in HPI, PMH and Problem List.  Past Medical History  Diagnosis Date  . OSA (obstructive sleep apnea)     CPAP  . Venous stasis ulcer     chornic, ?followed up at wound care center, multiple courses of antibiotics in past for cellulitis, on lasix  . GERD (gastroesophageal reflux disease)   . Anemia, iron deficiency     secondary to menhorrhagia, on oral iron, also b12 def, getting monthly b12 shots  . Chronic cough     secondary to alleriges and post nasal drip  . Depression   . Diabetes mellitus     well controlled on metformin  . Hyperlipidemia   . H/O mental retardation   . Herpes   . Cor pulmonale     PA Peak pressure 2mHg  . Diastolic heart failure   . Restrictive lung disease     PFTs 06/2012 (FVC 54% predicted and FEV1 68% predicted w minimal bronchodilator response).  . CHF (congestive heart failure)   . Renal disorder   . Pulmonary hypertension   . Hypertension   . Shortness of breath   . COPD (chronic obstructive pulmonary disease)     Current Outpatient Prescriptions  Medication Sig Dispense Refill  . albuterol (PROVENTIL HFA;VENTOLIN HFA) 108 (90 BASE) MCG/ACT inhaler  Inhale 2 puffs into the lungs every 6 (six) hours as needed for wheezing or shortness of breath.      . Blood Glucose Monitoring Suppl (ACCU-CHEK AVIVA) device Use to check blood sugars daily. Dx code: 250.60.  1 each  0  . cyanocobalamin 500 MCG tablet Take 500 mcg by mouth daily.      . fluticasone (FLONASE) 50 MCG/ACT nasal spray Place 2 sprays into the nose daily.       Marland Kitchen gabapentin (NEURONTIN) 300 MG capsule Take 600 mg by mouth 3 (three) times daily.      Marland Kitchen glucose blood (ACCU-CHEK AVIVA) test strip Use to check blood sugars daily. Dx code: 250.60.  100 each  12  . glucose blood (FREESTYLE TEST STRIPS) test strip Use as instructed  100 each  12  . glucose monitoring kit (FREESTYLE)  monitoring kit 1 each by Does not apply route as needed for other.  1 each  0  . Lancets (FREESTYLE) lancets Use as instructed  100 each  12  . loperamide (IMODIUM) 2 MG capsule Take 2 mg by mouth daily as needed for diarrhea or loose stools.      Marland Kitchen loratadine (CLARITIN) 10 MG tablet TAKE ONE TABLET BY MOUTH ONCE DAILY  30 tablet  3  . metFORMIN (GLUCOPHAGE) 1000 MG tablet Take 1,000 mg by mouth 2 (two) times daily with a meal.      . pantoprazole (PROTONIX) 20 MG tablet Take 20 mg by mouth 2 (two) times daily.      . potassium chloride SA (K-DUR,KLOR-CON) 20 MEQ tablet Take 40 mEq by mouth 3 (three) times daily. Take extra tab when take an extra Torsemide      . pravastatin (PRAVACHOL) 20 MG tablet Take 1 tablet (20 mg total) by mouth daily.  30 tablet  6  . Rivaroxaban (XARELTO) 20 MG TABS tablet Take 1 tablet (20 mg total) by mouth daily with supper.  30 tablet  6  . sodium chloride 0.9 % SOLN with treprostinil 1 MG/ML SOLN Inject 80 ng/kg/min into the vein continuous. Dose 80ng/kg/min based on dosing wt of 94kg. 74m/24hours      . spironolactone (ALDACTONE) 25 MG tablet Take 0.5 tablets (12.5 mg total) by mouth daily.  90 tablet  3  . torsemide (DEMADEX) 20 MG tablet Take 80 mg by mouth daily. Take extra tab as needed for weight gain       No current facility-administered medications for this encounter.    Filed Vitals:   03/15/13 1248  BP: 96/60  Pulse: 103  Weight: 214 lb 12.8 oz (97.433 kg)  SpO2: 94%    Physical Exam:  General: Morbidly obese. On chronic 3 L O2 used cane to ambulate into clinic.  HEENT: normal  Neck: supple. JVP difficult to assess d/t body habitus, but does not appear overly elevated  Carotids 2+ bilat; no bruits. No lymphadenopathy or thryomegaly appreciated.  Cor: PMI nonpalpable. Mildly irregular 2/6 TR increased P2  Hickman catheter site OK Lungs: clear Abdomen: obese soft, nontender, nondistended. No hepatosplenomegaly. No bruits or masses. Good bowel  sounds.  Extremities: no cyanosis, clubbing, rash, trace edema; TED hose intact Neuro: alert & orientedx3, cranial nerves grossly intact. moves all 4 extremities w/o difficulty. Affect pleasant   ECG: SR with RVH  ASSESSMENT & PLAN:  1) Chronic diastolic HF/cor pulmonale: LV EF 60-65% RV mod reduced.  NYHA II   - Volume status looks good. Continue 12.5 mg spironolactone daily and 80  mg torsedmide daily. Continue sliding scale torsemide as needed. - Reinforced the need and importance of daily weights, a low sodium diet, and fluid restriction (less than 2 L a day). Instructed to call the HF clinic if weight increases more than 3 lbs overnight or 5 lbs in a week.  Check BMET today  2) PAH, severe with cor pulmonale.  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 However PAH far out of proportion to L-sided pressures or restrictive lung disease. - Overall stable. NYHA II-III. Doing well with IV trepostinil. Follows with [facility]. - Echo reviewed and shows persistent RH strain. Consider addition of tadalafil   3) Paroxysmal atrial flutter.  Remains in NSR. No evidence of recurrence. Continue Xarelto.  Follow up in 3 months  CLEGG,AMY,NP-C   12:54 PM  Patient seen and examined with Darrick Grinder, NP. We discussed all aspects of the encounter. I agree with the assessment and plan as stated above.  Overall doing well on Remodulin but with persistent RH strain and PAH. Will start Adcirca 20 mg daily and titrate to 40 daily. Reinforced need for daily weights and reviewed use of sliding scale diuretics. Will f/u [facility] clinic. AFL remains quiescent. Continue Xarelto. Will do 6MW.   Daniel Bensimhon,MD 1:27 PM

## 2013-03-15 NOTE — Patient Instructions (Signed)
Start Adcirca 20 mg daily, we have sent referral to [facility] they will contact you regarding your copay, if it is too much money for you tell them that and they can refer you to a patient assistance program  We will contact you in 3 months to schedule your next appointment.

## 2013-03-15 NOTE — Addendum Note (Signed)
Encounter addended by: Scarlette Calico, RN on: 03/15/2013  1:48 PM<BR>     Documentation filed: Patient Instructions Section

## 2013-03-15 NOTE — Addendum Note (Signed)
Encounter addended by: Scarlette Calico, RN on: 03/15/2013  2:16 PM<BR>     Documentation filed: Notes Section

## 2013-03-20 ENCOUNTER — Other Ambulatory Visit: Payer: Self-pay | Admitting: *Deleted

## 2013-03-20 ENCOUNTER — Telehealth: Payer: Self-pay | Admitting: Cardiology

## 2013-03-20 MED ORDER — GABAPENTIN 300 MG PO CAPS: 600.0000 mg | ORAL_CAPSULE | Freq: Three times a day (TID) | ORAL | Status: DC

## 2013-03-20 NOTE — Telephone Encounter (Signed)
Forms for Sleep Apnea equipmnent

## 2013-03-20 NOTE — Telephone Encounter (Signed)
Pt also stated her test strips cost $50.00 when she went to get them refilled. I called [facility] and was told someone made a mistake; they are stil free - pt informed.

## 2013-03-21 ENCOUNTER — Emergency Department (HOSPITAL_COMMUNITY)
Admission: EM | Admit: 2013-03-21 | Discharge: 2013-03-22 | Disposition: A | Discharge status: Home / Self Care | Payer: Medicaid Other | Attending: Emergency Medicine | Admitting: Emergency Medicine

## 2013-03-21 ENCOUNTER — Encounter (HOSPITAL_COMMUNITY): Payer: Self-pay | Admitting: Emergency Medicine

## 2013-03-21 DIAGNOSIS — L539 Erythematous condition, unspecified: Secondary | ICD-10-CM | POA: Insufficient documentation

## 2013-03-21 DIAGNOSIS — D509 Iron deficiency anemia, unspecified: Secondary | ICD-10-CM | POA: Insufficient documentation

## 2013-03-21 DIAGNOSIS — L97909 Non-pressure chronic ulcer of unspecified part of unspecified lower leg with unspecified severity: Secondary | ICD-10-CM

## 2013-03-21 DIAGNOSIS — T4995XA Adverse effect of unspecified topical agent, initial encounter: Secondary | ICD-10-CM | POA: Insufficient documentation

## 2013-03-21 DIAGNOSIS — J4489 Other specified chronic obstructive pulmonary disease: Secondary | ICD-10-CM | POA: Insufficient documentation

## 2013-03-21 DIAGNOSIS — I2789 Other specified pulmonary heart diseases: Secondary | ICD-10-CM | POA: Insufficient documentation

## 2013-03-21 DIAGNOSIS — I83009 Varicose veins of unspecified lower extremity with ulcer of unspecified site: Secondary | ICD-10-CM | POA: Insufficient documentation

## 2013-03-21 DIAGNOSIS — K219 Gastro-esophageal reflux disease without esophagitis: Secondary | ICD-10-CM | POA: Insufficient documentation

## 2013-03-21 DIAGNOSIS — G4733 Obstructive sleep apnea (adult) (pediatric): Secondary | ICD-10-CM | POA: Insufficient documentation

## 2013-03-21 DIAGNOSIS — E785 Hyperlipidemia, unspecified: Secondary | ICD-10-CM | POA: Insufficient documentation

## 2013-03-21 DIAGNOSIS — F329 Major depressive disorder, single episode, unspecified: Secondary | ICD-10-CM | POA: Insufficient documentation

## 2013-03-21 DIAGNOSIS — M62838 Other muscle spasm: Secondary | ICD-10-CM | POA: Insufficient documentation

## 2013-03-21 DIAGNOSIS — Z9981 Dependence on supplemental oxygen: Secondary | ICD-10-CM | POA: Insufficient documentation

## 2013-03-21 DIAGNOSIS — T7840XA Allergy, unspecified, initial encounter: Secondary | ICD-10-CM

## 2013-03-21 DIAGNOSIS — Z8619 Personal history of other infectious and parasitic diseases: Secondary | ICD-10-CM | POA: Insufficient documentation

## 2013-03-21 DIAGNOSIS — Z87448 Personal history of other diseases of urinary system: Secondary | ICD-10-CM | POA: Insufficient documentation

## 2013-03-21 DIAGNOSIS — J449 Chronic obstructive pulmonary disease, unspecified: Secondary | ICD-10-CM | POA: Insufficient documentation

## 2013-03-21 DIAGNOSIS — I5030 Unspecified diastolic (congestive) heart failure: Secondary | ICD-10-CM | POA: Insufficient documentation

## 2013-03-21 DIAGNOSIS — Z7901 Long term (current) use of anticoagulants: Secondary | ICD-10-CM | POA: Insufficient documentation

## 2013-03-21 DIAGNOSIS — Z79899 Other long term (current) drug therapy: Secondary | ICD-10-CM | POA: Insufficient documentation

## 2013-03-21 DIAGNOSIS — E119 Type 2 diabetes mellitus without complications: Secondary | ICD-10-CM | POA: Insufficient documentation

## 2013-03-21 DIAGNOSIS — E538 Deficiency of other specified B group vitamins: Secondary | ICD-10-CM | POA: Insufficient documentation

## 2013-03-21 DIAGNOSIS — R6883 Chills (without fever): Secondary | ICD-10-CM | POA: Insufficient documentation

## 2013-03-21 DIAGNOSIS — F3289 Other specified depressive episodes: Secondary | ICD-10-CM | POA: Insufficient documentation

## 2013-03-21 MED ORDER — DIPHENHYDRAMINE HCL 25 MG PO TABS: 25.0000 mg | ORAL_TABLET | Freq: Three times a day (TID) | ORAL | Status: DC

## 2013-03-21 MED ORDER — DIPHENHYDRAMINE HCL 25 MG PO CAPS
25.0000 mg | ORAL_CAPSULE | Freq: Once | ORAL | Status: AC
  Administered 2013-03-21: 25 mg via ORAL
  Filled 2013-03-21: qty 1

## 2013-03-21 MED ORDER — FAMOTIDINE 20 MG PO TABS: 20.0000 mg | ORAL_TABLET | Freq: Two times a day (BID) | ORAL | Status: DC

## 2013-03-21 MED ORDER — FAMOTIDINE 20 MG PO TABS
20.0000 mg | ORAL_TABLET | Freq: Once | ORAL | Status: AC
  Administered 2013-03-21: 20 mg via ORAL
  Filled 2013-03-21: qty 1

## 2013-03-21 NOTE — ED Provider Notes (Signed)
CSN: [id number]     Arrival date & time 03/21/13  1609 History   First MD Initiated Contact with Patient 03/21/13 2029     Chief Complaint  Patient presents with  . Chills    . Spasms  . Allergic Reaction     HPI  Patient presents with concern of possible allergic reaction. Patient's multiple medical problems, but she notes that these are generally well managed. She is For medication, no recent changes. Today, in the hours prior to EMS: Patient developed redness in both arms, right upper chest. There is no associated dyspnea, syncope, pain. Symptoms seem improved with Benadryl. Patient is on home oxygen, notes that this is unchanged. Patient is also on continuous infusion for pulmonary hypertension.   Past Medical History  Diagnosis Date  . OSA (obstructive sleep apnea)     CPAP  . Venous stasis ulcer     chornic, ?followed up at wound care center, multiple courses of antibiotics in past for cellulitis, on lasix  . GERD (gastroesophageal reflux disease)   . Anemia, iron deficiency     secondary to menhorrhagia, on oral iron, also b12 def, getting monthly b12 shots  . Chronic cough     secondary to alleriges and post nasal drip  . Depression   . Diabetes mellitus     well controlled on metformin  . Hyperlipidemia   . H/O mental retardation   . Herpes   . Cor pulmonale     PA Peak pressure 61mHg  . Diastolic heart failure   . Restrictive lung disease     PFTs 06/2012 (FVC 54% predicted and FEV1 68% predicted w minimal bronchodilator response).  . CHF (congestive heart failure)   . Renal disorder   . Pulmonary hypertension   . Hypertension   . Shortness of breath   . COPD (chronic obstructive pulmonary disease)    Past Surgical History  Procedure Laterality Date  . Cholecystectomy     Family History  Problem Relation Age of Onset  . Mental illness Sister   . Mental retardation Brother    History  Substance Use Topics  . Smoking status: Never Smoker   .  Smokeless tobacco: Never Used  . Alcohol Use: No   OB History   Grav Para Term Preterm Abortions TAB SAB Ect Mult Living   _0 Review of Systems    Allergies  Aspirin; Codeine; Lisinopril; and Sulfonamide derivatives  Home Medications   Current Outpatient Rx  Name  Route  Sig  Dispense  Refill  . cyanocobalamin 500 MCG tablet   Oral   Take 500 mcg by mouth daily.         .Marland Kitchengabapentin (NEURONTIN) 300 MG capsule   Oral   Take 2 capsules (600 mg total) by mouth 3 (three) times daily.   180 capsule   5   . loperamide (IMODIUM) 2 MG capsule   Oral   Take 2 mg by mouth daily as needed for diarrhea or loose stools.         .Marland Kitchenloratadine (CLARITIN) 10 MG tablet   Oral   Take 10 mg by mouth daily.         . metFORMIN (GLUCOPHAGE) 1000 MG tablet   Oral   Take 1,000 mg by mouth 2 (two) times daily with a meal.         . pantoprazole (PROTONIX) 20 MG tablet  Oral   Take 20 mg by mouth 2 (two) times daily.         . potassium chloride SA (K-DUR,KLOR-CON) 20 MEQ tablet   Oral   Take 40 mEq by mouth 3 (three) times daily. Take extra tab when take an extra Torsemide         . pravastatin (PRAVACHOL) 20 MG tablet   Oral   Take 1 tablet (20 mg total) by mouth daily.   30 tablet   6   . Rivaroxaban (XARELTO) 20 MG TABS tablet   Oral   Take 1 tablet (20 mg total) by mouth daily with supper.   30 tablet   6   . sodium chloride 0.9 % SOLN with treprostinil 1 MG/ML SOLN   Intravenous   Inject 80 ng/kg/min into the vein continuous. Dose 80ng/kg/min based on dosing wt of 94kg. 15m/24hours         . spironolactone (ALDACTONE) 25 MG tablet   Oral   Take 0.5 tablets (12.5 mg total) by mouth daily.   90 tablet   3   . torsemide (DEMADEX) 20 MG tablet   Oral   Take 80 mg by mouth daily. Take extra tab as needed for weight gain          BP 92/52  Pulse 86  Temp(Src) 97.7 F (36.5 C) (Oral)  Resp 16  SpO2 100% Physical Exam  Nursing  note and vitals reviewed. Constitutional: She is oriented to person, place, and time. She appears well-developed and well-nourished. She has a sickly appearance. No distress.  HENT:  Head: Normocephalic and atraumatic.  Eyes: Conjunctivae and EOM are normal.  Cardiovascular: Normal rate and regular rhythm.   Pulmonary/Chest: Effort normal and breath sounds normal. No stridor. No respiratory distress.    Abdominal: She exhibits no distension.  Musculoskeletal: She exhibits no edema.  Neurological: She is alert and oriented to person, place, and time. No cranial nerve deficit.  Skin: Skin is warm and dry.  Throughout the upper extremities there are multiple pock marks on the left upper extremity there is trace erythema, confluent, non-raised   Psychiatric: She has a normal mood and affect.    ED Course  Procedures (including critical care time)  I reviewed the patient's chart, including recent visits with cardiology.  Update: On repeat exam the patient appears comfortable.  Update: On exam the patient notes that her erythema has diminished, has no new complaints.  Pulse oximetry 97% on supplemental oxygen abnormal   MDM   Final diagnoses:  Allergic reaction    Patient presents after possible allergic reaction.  Patient does have multiple medical problems, but on exam, and throughout her emergency room course is hemodynamically stable, in no distress, with no evidence of ongoing infection or imminent decompensation.  Patient continues his typical amounts of supplemental oxygen, and has no pain. After a period of monitoring, the patient was discharged in stable condition to follow up with her team.  With her complex medical history, no warmth her medications were added, though she was discharged with a course of antihistamines for her possible allergic reaction.    RCarmin Muskrat MD 03/22/13 [phone]

## 2013-03-21 NOTE — ED Notes (Addendum)
Pt in via EMS, per EMS- was called out to home in reference to possible allergic reaction to some tape at home, redness noted to chest wall, pt given 51m benadryl, pt also developed chills, also muscle spasms in her shoulders, denies other symptoms. No distress noted. Pt arrived on 4L via [place] which she is on at all times at home.

## 2013-03-21 NOTE — Discharge Instructions (Signed)
As discussed, your evaluation today has been largely reassuring.  But, it is important that you monitor your condition carefully, and do not hesitate to return to the ED if you develop new, or concerning changes in your condition.  Otherwise, please follow-up with your physician for appropriate ongoing care.    Allergies  Allergies may happen from anything your body is sensitive to. This may be food, medicines, pollens, chemicals, and many other things. Food allergies can be severe and deadly.  HOME CARE  If you do not know what causes a reaction, keep a diary. Write down the foods you ate and the symptoms that followed. Avoid foods that cause reactions.  If you have red raised spots (hives) or a rash:  Take medicine as told by your doctor.  Use medicines for red raised spots and itching as needed.  Apply cold cloths (compresses) to the skin. Take a cool bath. Avoid hot baths or showers.  If you are severely allergic:  It is often necessary to go to the hospital after you have treated your reaction.  Wear your medical alert jewelry.  You and your family must learn how to give a allergy shot or use an allergy kit (anaphylaxis kit).  Always carry your allergy kit or shot with you. Use this medicine as told by your doctor if a severe reaction is occurring. GET HELP RIGHT AWAY IF:  You have trouble breathing or are making high-pitched whistling sounds (wheezing).  You have a tight feeling in your chest or throat.  You have a puffy (swollen) mouth.  You have red raised spots, puffiness (swelling), or itching all over your body.  You have had a severe reaction that was helped by your allergy kit or shot. The reaction can return once the medicine has worn off.  You think you are having a food allergy. Symptoms most often happen within 30 minutes of eating a food.  Your symptoms have not gone away within 2 days or are getting worse.  You have new symptoms.  You want to retest  yourself with a food or drink you think causes an allergic reaction. Only do this under the care of a doctor. MAKE SURE YOU:   Understand these instructions.  Will watch your condition.  Will get help right away if you are not doing well or get worse. Document Released: 04/17/2012 Document Reviewed: 04/17/2012 [facility] Patient Information 2014 [place], [place].

## 2013-04-10 ENCOUNTER — Other Ambulatory Visit: Payer: Self-pay | Admitting: Internal Medicine

## 2013-04-16 ENCOUNTER — Other Ambulatory Visit: Payer: Self-pay | Admitting: Internal Medicine

## 2013-05-14 ENCOUNTER — Other Ambulatory Visit: Payer: Self-pay | Admitting: Internal Medicine

## 2013-05-16 ENCOUNTER — Ambulatory Visit (INDEPENDENT_AMBULATORY_CARE_PROVIDER_SITE_OTHER): Payer: Medicaid Other | Admitting: Internal Medicine

## 2013-05-16 ENCOUNTER — Encounter: Payer: Self-pay | Admitting: Internal Medicine

## 2013-05-16 VITALS — BP 91/62 | HR 87 | Temp 97.0°F | Ht 62.0 in | Wt 226.2 lb

## 2013-05-16 DIAGNOSIS — E1149 Type 2 diabetes mellitus with other diabetic neurological complication: Secondary | ICD-10-CM

## 2013-05-16 DIAGNOSIS — M791 Myalgia, unspecified site: Secondary | ICD-10-CM | POA: Insufficient documentation

## 2013-05-16 DIAGNOSIS — E876 Hypokalemia: Secondary | ICD-10-CM

## 2013-05-16 DIAGNOSIS — IMO0001 Reserved for inherently not codable concepts without codable children: Secondary | ICD-10-CM

## 2013-05-16 DIAGNOSIS — I272 Pulmonary hypertension, unspecified: Secondary | ICD-10-CM

## 2013-05-16 DIAGNOSIS — I5032 Chronic diastolic (congestive) heart failure: Secondary | ICD-10-CM

## 2013-05-16 DIAGNOSIS — E785 Hyperlipidemia, unspecified: Secondary | ICD-10-CM

## 2013-05-16 DIAGNOSIS — I2789 Other specified pulmonary heart diseases: Secondary | ICD-10-CM

## 2013-05-16 LAB — POCT GLYCOSYLATED HEMOGLOBIN (HGB A1C): HEMOGLOBIN A1C: 5.4

## 2013-05-16 LAB — GLUCOSE, CAPILLARY: Glucose-Capillary: 75 mg/dL (ref 70–99)

## 2013-05-16 MED ORDER — TADALAFIL (PAH) 20 MG PO TABS: 20.0000 mg | ORAL_TABLET | Freq: Every day | ORAL | Status: DC

## 2013-05-16 NOTE — Progress Notes (Signed)
Attending physician note: Presenting complaints, physical findings, and medications, reviewed with resident physician Dr. Reuel Boom I concur with his management. Murriel Hopper, M.D., [place]

## 2013-05-16 NOTE — Assessment & Plan Note (Addendum)
Patient follows very closely w/ CHF clinic, currently on Torsemide 80 mg po qd + Spironolactone 12.5 po qd, also started on Tadalafil 20 mg po qd for PAH. During previous CHF clinic visit (03/2013), weight was 214 lbs, now increased to 226 lbs. Patient denies SOB, DOE, chest pain, increased LE swelling, PND or orthopnea. Does not appear volume overloaded on exam, walking well on home O2. I suspect weight gain is more likely 2/2 increased food intake as patient claims her appetite has increased recently.  -Will call cardiology clinic to schedule follow up regarding weight gain -Continue meds as above -CMP + lipid panel pending (patient left prior to blood draw)

## 2013-05-16 NOTE — Patient Instructions (Signed)
General Instructions:  1. Please schedule a follow up appointment for 6-8 weeks.  2. Please take all medications as prescribed. If your HbA1c is IMPROVED, I will decrease Metformin dose.   Please take Tylenol for arm pain.   3. If you have worsening of your symptoms or new symptoms arise, please call the clinic ([phone]), or go to the ER immediately if symptoms are severe.  You have done a great job in taking all your medications. I appreciate it very much. Please continue doing that.   Please try to bring all your medicines next time. This will help [place] keep you safe from mistakes.   Progress Toward Treatment Goals:  Treatment Goal 05/16/2013  Hemoglobin A1C at goal    Self Care Goals & Plans:  Self Care Goal 05/16/2013  Manage my medications take my medicines as prescribed; bring my medications to every visit; refill my medications on time  Monitor my health -  Eat healthy foods -  Meeting treatment goals maintain the current self-care plan    Home Blood Glucose Monitoring 05/16/2013  Check my blood sugar no home glucose monitoring  When to check my blood sugar N/A     Care Management & Community Referrals:  Referral 05/16/2013  Referrals made for care management support none needed  Referrals made to community resources none

## 2013-05-16 NOTE — Assessment & Plan Note (Signed)
Patient on Torsemide 80 mg po qd + Spironolactone 12.5 mg po qd + K-dur 40 mg po TID.  -CMP pending (patient left prior to blood draw). -Continue meds as above for now

## 2013-05-16 NOTE — Assessment & Plan Note (Signed)
Recently started on Tadalafil 20 mg po qd by Dr. Haroldine Laws. Still tolerating Remodulin for now, follows closely w/ Dr. Gilles Chiquito at [facility] for [facility]. Most recent ECHO from 02/2013 shows normal LV size and systolic function, EF 10-07%. D-shaped interventricular septum suggesting RV pressure/volume overload. Severely dilated RV with mildly decreased systolic function. Moderate pulmonary hypertension. -Patient to follow up w/ Dr. Gilles Chiquito in 07/2013 (missed appointment on 05/01/13).  -Continue meds as above -Patient to follow up soon w/ Dr. Haroldine Laws (as described above)

## 2013-05-16 NOTE — Progress Notes (Signed)
[name], female   DOB: 1960-09-22, 53 y.o.   MRN: [id number]   Subjective:   [name]   DOB: 31-Dec-1960 53 y.o.   MRN: [id number]  HPI: [name] is a 53 y.o. 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 The patient appears in good spirits, walking on her own, no DOE. Her only complaint today is UE muscle pain that she says is dull in nature, 3-4/10 in severity, and seems to be worse at night. After some research, as well as discussing w/ the patient, it seems that this is a common adverse effect of Remodulin. [name] continues to follow at [facility] w/ Dr. Gilles Chiquito for [facility], continues to take Remodulin w/out any other significant issues. Today, her rash appears better on exam than it has previously. The patient also admits to using CPAP at night w/ good compliance. Still using 3-4L O2 via [place] at home.   [name] was seen 03/15/13 by Dr. Haroldine Laws for CHF and PAH, started on Tadalafil 20 mg po qd at that time and claims to have been taking that, in addition to Spironolactone 12.5 mg po qd + Torsemide 80 mg po qd. During her visit w/ Dr. Haroldine Laws, the patient was 214 lbs and today her weight is up to 226 lbs. The patient denies any significant increased leg swelling, SOB, cough, DOE, PND, or orthopnea. She does not appear volume overloaded on exam, but does claim that her appetite has been increased recently.   DM well controlled, HbA1c of 5.3 on 09/29/12, 5.4 today. Has been taking Metformin 1000 mg bid according to the patient.     Past Medical History  Diagnosis Date  . OSA (obstructive sleep apnea)     CPAP  . Venous stasis ulcer     chornic, ?followed up at wound care center, multiple courses of antibiotics in past for cellulitis, on lasix  . GERD (gastroesophageal reflux disease)   . Anemia, iron deficiency    secondary to menhorrhagia, on oral iron, also b12 def, getting monthly b12 shots  . Chronic cough     secondary to alleriges and post nasal drip  . Depression   . Diabetes mellitus     well controlled on metformin  . Hyperlipidemia   . H/O mental retardation   . Herpes   . Cor pulmonale     PA Peak pressure 42mHg  . Diastolic heart failure   . Restrictive lung disease     PFTs 06/2012 (FVC 54% predicted and FEV1 68% predicted w minimal bronchodilator response).  . CHF (congestive heart failure)   . Renal disorder   . Pulmonary hypertension   . Hypertension   . Shortness of breath   . COPD (chronic obstructive pulmonary disease)    Current Outpatient Prescriptions  Medication Sig Dispense Refill  . cyanocobalamin 500 MCG tablet Take 500 mcg by mouth daily.      . diphenhydrAMINE (BENADRYL) 25 MG tablet Take 1 tablet (25 mg total) by mouth 3 (three) times daily.  9 tablet  0  . famotidine (PEPCID) 20 MG tablet Take 1 tablet (20 mg total) by mouth 2 (two) times daily.  6 tablet  0  . gabapentin (NEURONTIN) 300 MG capsule Take 2 capsules (600 mg total) by mouth 3 (three) times daily.  180 capsule  5  . loperamide (  IMODIUM) 2 MG capsule Take 2 mg by mouth daily as needed for diarrhea or loose stools.      Marland Kitchen loratadine (CLARITIN) 10 MG tablet TAKE ONE TABLET BY MOUTH ONCE DAILY  30 tablet  1  . metFORMIN (GLUCOPHAGE) 1000 MG tablet Take 1,000 mg by mouth 2 (two) times daily with a meal.      . pantoprazole (PROTONIX) 20 MG tablet TAKE ONE TABLET BY MOUTH TWICE DAILY  60 tablet  5  . potassium chloride SA (K-DUR,KLOR-CON) 20 MEQ tablet Take 40 mEq by mouth 3 (three) times daily. Take extra tab when take an extra Torsemide      . pravastatin (PRAVACHOL) 20 MG tablet Take 1 tablet (20 mg total) by mouth daily.  30 tablet  6  . Rivaroxaban (XARELTO) 20 MG TABS tablet Take 1 tablet (20 mg total) by mouth daily with supper.  30 tablet  6  . sodium chloride 0.9 % SOLN with treprostinil 1 MG/ML  SOLN Inject 80 ng/kg/min into the vein continuous. Dose 80ng/kg/min based on dosing wt of 94kg. 51m/24hours      . spironolactone (ALDACTONE) 25 MG tablet Take 0.5 tablets (12.5 mg total) by mouth daily.  90 tablet  3  . Tadalafil, PAH, (ADCIRCA) 20 MG TABS Take 1 tablet (20 mg total) by mouth daily.      .Marland Kitchentorsemide (DEMADEX) 20 MG tablet Take 80 mg by mouth daily. Take extra tab as needed for weight gain       No current facility-administered medications for this visit.   Family History  Problem Relation Age of Onset  . Mental illness Sister   . Mental retardation Brother    History   Social History  . Marital Status: Single    Spouse Name: N/A    Number of Children: N/A  . Years of Education: N/A   Social History Main Topics  . Smoking status: Never Smoker   . Smokeless tobacco: Never Used  . Alcohol Use: No  . Drug Use: No  . Sexual Activity: None   Other Topics Concern  . None   Social History Narrative  . None   Review of Systems: General: Denies fever, chills, diaphoresis, appetite change and fatigue.  Respiratory: Denies SOB, cough, chest tightness, and wheezing.   Cardiovascular: Denies chest pain, palpitations and leg swelling.  Gastrointestinal: Denies nausea, vomiting, abdominal pain, diarrhea, constipation, blood in stool and abdominal distention.  Genitourinary: Denies dysuria, urgency, frequency, hematuria, flank pain and difficulty urinating.  Endocrine: Denies hot or cold intolerance, sweats, polyuria, polydipsia. Musculoskeletal: Positive for UE myalgias. Denies back pain, joint swelling, arthralgias and gait problem.  Skin: Positive for rash. Denies pallor and wounds.  Neurological: Denies dizziness, seizures, syncope, weakness, lightheadedness, numbness and headaches.  Psychiatric/Behavioral: Denies mood changes, confusion, nervousness, sleep disturbance and agitation.  Objective:  Physical Exam: Filed Vitals:   05/16/13 1507  BP: 91/62  Pulse:  87  Temp: 97 F (36.1 C)  TempSrc: Oral  Height: _0  (1.575 m)  Weight: 226 lb 3.2 oz (102.604 kg)  SpO2: 98%   General: Vital signs reviewed.  Patient is a very pleasant obese female, in no acute distress and cooperative with exam.  Head: Normocephalic and atraumatic. Eyes: PERRL, EOMI, conjunctivae normal, No scleral icterus.  Neck: Supple, trachea midline, normal ROM, No JVD, masses, thyromegaly, or carotid bruit present.  Cardiovascular: RRR, S1 normal, S2 normal, no murmurs, gallops, or rubs. Pulmonary/Chest: Normal respiratory effort, CTAB, no wheezes, rales, or rhonchi.  On 3L O2 via [place]. Abdominal: Soft, non-tender, non-distended, bowel sounds are normal, no masses, organomegaly, or guarding present.  Musculoskeletal: No joint deformities, erythema, or stiffness, ROM full and no nontender. Extremities: Trace pitting edema in LE's, pulses symmetric and intact bilaterally. No cyanosis or clubbing. Neurological: A&O x3, Strength is normal and symmetric bilaterally, cranial nerve II-XII are grossly intact, no focal motor deficit, sensory intact to light touch bilaterally.  Skin: Warm, dry and intact. Rash present on UE's and chest; whitish macules, improved from previous exams.  Psychiatric: Normal mood and affect. Patient w/ mild mental retardation. Speech is pressured at times, behavior is normal.  Assessment & Plan:   Please see problem-based assessment and plan.

## 2013-05-16 NOTE — Assessment & Plan Note (Addendum)
-  Continue Pravachol 20 mg po qd -Recheck Lipid panel today.

## 2013-05-16 NOTE — Assessment & Plan Note (Signed)
Patient complaining of muscle pain in her arms bilaterally. Claims the pain is a 3-4/10 in severity, relieved by Tylenol, which she has been taking almost every day. After researching treprostinil, up to 14% of patients have limb pain as an adverse reaction of this medication. Patient claims the pain is tolerable, not tender to the touch.  -Will check CMP for LFT's (tylenol) + Cr -Do not suspect rhabdomyolysis at this time.

## 2013-05-16 NOTE — Assessment & Plan Note (Addendum)
Lab Results  Component Value Date   HGBA1C 5.4 05/16/2013   HGBA1C 5.3 09/29/2012   HGBA1C 6.0 05/31/2012     Assessment: Diabetes control: good control (HgbA1C at goal) Progress toward A1C goal:  at goal Comments: Patient w/ very well controlled DM, on Metformin 1000 mg bid. Denies any symptoms of hypoglycemia.   Plan: Medications:  Will decrease Metformin to 1000 mg po qd.  Home glucose monitoring: Frequency: no home glucose monitoring Timing: N/A Instruction/counseling given: discussed the need for weight loss and discussed diet Educational resources provided:   Self management tools provided:   Other plans: CMP pending

## 2013-05-17 ENCOUNTER — Other Ambulatory Visit: Payer: Self-pay | Admitting: Internal Medicine

## 2013-05-17 ENCOUNTER — Other Ambulatory Visit (INDEPENDENT_AMBULATORY_CARE_PROVIDER_SITE_OTHER): Payer: Medicaid Other

## 2013-05-17 DIAGNOSIS — E876 Hypokalemia: Secondary | ICD-10-CM

## 2013-05-17 DIAGNOSIS — E1149 Type 2 diabetes mellitus with other diabetic neurological complication: Secondary | ICD-10-CM

## 2013-05-17 DIAGNOSIS — E785 Hyperlipidemia, unspecified: Secondary | ICD-10-CM

## 2013-05-17 LAB — COMPLETE METABOLIC PANEL WITH GFR
ALBUMIN: 4.1 g/dL (ref 3.5–5.2)
ALT: 17 U/L (ref 0–35)
AST: 21 U/L (ref 0–37)
Alkaline Phosphatase: 105 U/L (ref 39–117)
BUN: 16 mg/dL (ref 6–23)
CO2: 27 meq/L (ref 19–32)
Calcium: 9 mg/dL (ref 8.4–10.5)
Chloride: 101 mEq/L (ref 96–112)
Creat: 0.68 mg/dL (ref 0.50–1.10)
GLUCOSE: 96 mg/dL (ref 70–99)
Potassium: 4.3 mEq/L (ref 3.5–5.3)
Sodium: 138 mEq/L (ref 135–145)
TOTAL PROTEIN: 7.2 g/dL (ref 6.0–8.3)
Total Bilirubin: 0.3 mg/dL (ref 0.2–1.2)

## 2013-05-17 LAB — LIPID PANEL
Cholesterol: 176 mg/dL (ref 0–200)
HDL: 49 mg/dL (ref >39)
LDL Cholesterol: 92 mg/dL (ref 0–99)
Total CHOL/HDL Ratio: 3.6 Ratio
Triglycerides: 175 mg/dL — ABNORMAL HIGH (ref <150)
VLDL: 35 mg/dL (ref 0–40)

## 2013-05-17 MED ORDER — METFORMIN HCL 1000 MG PO TABS: 1000.0000 mg | ORAL_TABLET | Freq: Every day | ORAL | Status: DC

## 2013-05-18 ENCOUNTER — Other Ambulatory Visit: Payer: Self-pay | Admitting: Internal Medicine

## 2013-05-18 NOTE — Progress Notes (Signed)
Patient needs the following health maintenance at her next [facility] follow up: -Urine Microalbumin/Creatinine -Eye exam -Foot exam  Signed: Corky Sox, MD 05/18/2013 10:52 AM

## 2013-06-19 ENCOUNTER — Ambulatory Visit (HOSPITAL_COMMUNITY)
Admission: RE | Admit: 2013-06-19 | Discharge: 2013-06-19 | Disposition: A | Discharge status: Home / Self Care | Payer: Medicaid Other | Source: Ambulatory Visit | Attending: Internal Medicine | Admitting: Internal Medicine

## 2013-06-19 VITALS — BP 90/60 | HR 102 | Wt 230.0 lb

## 2013-06-19 DIAGNOSIS — I4892 Unspecified atrial flutter: Secondary | ICD-10-CM | POA: Diagnosis not present

## 2013-06-19 DIAGNOSIS — I509 Heart failure, unspecified: Secondary | ICD-10-CM | POA: Diagnosis not present

## 2013-06-19 DIAGNOSIS — G4733 Obstructive sleep apnea (adult) (pediatric): Secondary | ICD-10-CM | POA: Insufficient documentation

## 2013-06-19 DIAGNOSIS — J449 Chronic obstructive pulmonary disease, unspecified: Secondary | ICD-10-CM | POA: Insufficient documentation

## 2013-06-19 DIAGNOSIS — E119 Type 2 diabetes mellitus without complications: Secondary | ICD-10-CM | POA: Insufficient documentation

## 2013-06-19 DIAGNOSIS — Z9981 Dependence on supplemental oxygen: Secondary | ICD-10-CM | POA: Diagnosis not present

## 2013-06-19 DIAGNOSIS — K219 Gastro-esophageal reflux disease without esophagitis: Secondary | ICD-10-CM | POA: Diagnosis not present

## 2013-06-19 DIAGNOSIS — I2789 Other specified pulmonary heart diseases: Secondary | ICD-10-CM | POA: Insufficient documentation

## 2013-06-19 DIAGNOSIS — Z9989 Dependence on other enabling machines and devices: Secondary | ICD-10-CM | POA: Diagnosis not present

## 2013-06-19 DIAGNOSIS — E785 Hyperlipidemia, unspecified: Secondary | ICD-10-CM | POA: Insufficient documentation

## 2013-06-19 DIAGNOSIS — Z9861 Coronary angioplasty status: Secondary | ICD-10-CM | POA: Insufficient documentation

## 2013-06-19 DIAGNOSIS — Z79899 Other long term (current) drug therapy: Secondary | ICD-10-CM | POA: Insufficient documentation

## 2013-06-19 DIAGNOSIS — J4489 Other specified chronic obstructive pulmonary disease: Secondary | ICD-10-CM | POA: Insufficient documentation

## 2013-06-19 DIAGNOSIS — Z7901 Long term (current) use of anticoagulants: Secondary | ICD-10-CM | POA: Diagnosis not present

## 2013-06-19 DIAGNOSIS — I272 Pulmonary hypertension, unspecified: Secondary | ICD-10-CM

## 2013-06-19 DIAGNOSIS — I10 Essential (primary) hypertension: Secondary | ICD-10-CM | POA: Diagnosis not present

## 2013-06-19 DIAGNOSIS — I5032 Chronic diastolic (congestive) heart failure: Secondary | ICD-10-CM | POA: Diagnosis present

## 2013-06-19 NOTE — Progress Notes (Signed)
[name], female   DOB: 04/20/1960, 53 y.o.   MRN: [id number]   Weight Range  200-202 pounds    Baseline proBNP   Pulmonology:  Cardiologist: Dr Kelby Aline    HPI: [name] is a 53 y.o. 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   She addmitted to [facility] in July with respiratory failure and RHC showed severe PAH with pulmonary pressures in the 90s. She was transferred to [facility] where she was started on Remodulin with excellent results.  07/2012 On dopamine 7.5 mcg/kg/min  RA 20  PAP 95/49  PCWP 15  SVR 2067  PVR 16.6 [place]  CO/CI 2.9/1.38  She was admitted again 08/16/12 for CP and tachycardia, was found to be in atypical flutter vs atrial tach. Amiodarone was considered but she converted spontaneously. She was started on Xarelto. Her discharge weight was 215 lbs.      She returns for follow up. Since the last visit adcirca was added. Doing well with Remodulin at 42ng. Denies SOB/PND/Orthopnea. Denies presyncope. Weight at home trending up to 230 pounds. Says her appetite has picked up. Eating lots of desserts at night.  Has a little nose bleed every now and then. No other bleeding problems.  Wears 3-4 L O2 chronically. Uses CPAP nightly.  [facility] following. Her son performs hickman catheter dressing changes. Followed by paramedicine.   ECHO 08/11/12 EF 60-65% RV moderately dilated Peak PA pressure 48 mmhg  ECHO 02/28/13 EF 55-60% Grade I DD RV severely dilated and HK. Trivial TR. Septum flat Peak PA pressure 53 mmhg  Labs        09/29/12: K+ 3.6, Creatinine 0.87       02/12/13 K 3.4 Creatinine 0.67 ---> given extra potassium and started on 12.5 mg spironolactone.         05/17/2013  K 4.3 Creatinine 0.68           ROS: All systems  negative except as listed in HPI, PMH and Problem List.  Past Medical History  Diagnosis Date  . OSA (obstructive sleep apnea)     CPAP  . Venous stasis ulcer     chornic, ?followed up at wound care center, multiple courses of antibiotics in past for cellulitis, on lasix  . GERD (gastroesophageal reflux disease)   . Anemia, iron deficiency     secondary to menhorrhagia, on oral iron, also b12 def, getting monthly b12 shots  . Chronic cough     secondary to alleriges and post nasal drip  . Depression   . Diabetes mellitus     well controlled on metformin  . Hyperlipidemia   . H/O mental retardation   . Herpes   . Cor pulmonale     PA Peak pressure 80mHg  . Diastolic heart failure   . Restrictive lung disease     PFTs 06/2012 (FVC 54% predicted and FEV1 68% predicted w minimal bronchodilator response).  . CHF (congestive heart failure)   . Renal disorder   . Pulmonary hypertension   . Hypertension   . Shortness of breath   . COPD (chronic obstructive pulmonary disease)     Current Outpatient Prescriptions  Medication Sig Dispense Refill  . cyanocobalamin 500 MCG tablet Take 500 mcg by mouth daily.      .Marland Kitchen  diphenhydrAMINE (BENADRYL) 25 MG tablet Take 1 tablet (25 mg total) by mouth 3 (three) times daily.  9 tablet  0  . famotidine (PEPCID) 20 MG tablet Take 1 tablet (20 mg total) by mouth 2 (two) times daily.  6 tablet  0  . gabapentin (NEURONTIN) 300 MG capsule Take 2 capsules (600 mg total) by mouth 3 (three) times daily.  180 capsule  5  . loperamide (IMODIUM) 2 MG capsule Take 2 mg by mouth daily as needed for diarrhea or loose stools.      Marland Kitchen loratadine (CLARITIN) 10 MG tablet TAKE ONE TABLET BY MOUTH ONCE DAILY  30 tablet  1  . metFORMIN (GLUCOPHAGE) 1000 MG tablet Take 500 mg by mouth daily with breakfast.      . pantoprazole (PROTONIX) 20 MG tablet TAKE ONE TABLET BY MOUTH TWICE DAILY  60 tablet  5  . potassium chloride SA (K-DUR,KLOR-CON) 20 MEQ tablet Take 40 mEq by  mouth 3 (three) times daily. Take extra tab when take an extra Torsemide      . pravastatin (PRAVACHOL) 20 MG tablet Take 1 tablet (20 mg total) by mouth daily.  30 tablet  6  . Rivaroxaban (XARELTO) 20 MG TABS tablet Take 1 tablet (20 mg total) by mouth daily with supper.  30 tablet  6  . sodium chloride 0.9 % SOLN with treprostinil 1 MG/ML SOLN Inject 80 ng/kg/min into the vein continuous. Dose 80ng/kg/min based on dosing wt of 94kg. 43m/24hours      . spironolactone (ALDACTONE) 25 MG tablet Take 0.5 tablets (12.5 mg total) by mouth daily.  90 tablet  3  . Tadalafil, PAH, (ADCIRCA) 20 MG TABS Take 1 tablet (20 mg total) by mouth daily.      .Marland Kitchentorsemide (DEMADEX) 20 MG tablet Take 80 mg by mouth daily. Take extra tab as needed for weight gain       No current facility-administered medications for this encounter.    Filed Vitals:   06/19/13 1142  BP: 90/60  Pulse: 102  Weight: 230 lb (104.327 kg)  SpO2: 97%    Physical Exam:  General: Morbidly obese. On chronic 3 L O2 used cane to ambulate into clinic.  HEENT: normal  Neck: supple. JVP difficult to assess d/t body habitus, but does not appear overly elevated  Carotids 2+ bilat; no bruits. No lymphadenopathy or thryomegaly appreciated.  Cor: PMI nonpalpable. Mildly irregular 2/6 TR increased P2  Hickman catheter site OK Lungs: clear Abdomen: obese soft, nontender, nondistended. No hepatosplenomegaly. No bruits or masses. Good bowel sounds.  Extremities: no cyanosis, clubbing, rash, trace edema; TED hose intact Neuro: alert & orientedx3, cranial nerves grossly intact. moves all 4 extremities w/o difficulty. Affect pleasant     ASSESSMENT & PLAN:  1) Chronic diastolic HF/cor pulmonale: LV EF 60-65% RV mod reduced.  NYHA II  - Volume status looks good despite weight gain. Continue 12.5 mg spironolactone daily and 80 mg torsedmide daily. Continue sliding scale torsemide as needed. - Reinforced the need and importance of daily weights, a  low sodium diet, and fluid restriction (less than 2 L a day). Instructed to call the HF clinic if weight increases more than 3 lbs overnight or 5 lbs in a week.   2) PAH, severe with cor pulmonale.  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 However PAH far out of proportion to L-sided pressures or restrictive lung disease. - Overall stable. NYHA II-. Doing well with IV trepostinil. Follows  with [facility] adcirca 20 mg daily. Repeat ECHO    3) Paroxysmal atrial flutter.  In sinus. Continue Xarelto.  Follow up in 3 months  CLEGG,AMY,NP-C   11:46 AM  Patient seen and examined with Darrick Grinder, NP. We discussed all aspects of the encounter. I agree with the assessment and plan as stated above.   She is doing well. Volume status looks good. Now NYHA II on Remodulin and Adcirca. Will get f/u ECHO to reassess. Consider titrating Adcirca to 40.  Has f/u with [facility] clinic later this month.   Daniel Bensimhon,MD 6:01 PM

## 2013-06-19 NOTE — Patient Instructions (Signed)
Please follow in 3 months  Do the following things EVERYDAY: 1) Weigh yourself in the morning before breakfast. Write it down and keep it in a log. 2) Take your medicines as prescribed 3) Eat low salt foods-Limit salt (sodium) to 2000 mg per day.  4) Stay as active as you can everyday 5) Limit all fluids for the day to less than 2 liters

## 2013-06-25 ENCOUNTER — Encounter (HOSPITAL_COMMUNITY): Payer: Self-pay | Admitting: Emergency Medicine

## 2013-06-25 ENCOUNTER — Inpatient Hospital Stay (HOSPITAL_COMMUNITY)
Admission: EM | Admit: 2013-06-25 | Discharge: 2013-06-25 | DRG: 392 | Disposition: A | Discharge status: Home / Self Care | Payer: Medicaid Other | Attending: Internal Medicine | Admitting: Internal Medicine

## 2013-06-25 ENCOUNTER — Emergency Department (HOSPITAL_COMMUNITY): Payer: Medicaid Other

## 2013-06-25 DIAGNOSIS — M25511 Pain in right shoulder: Secondary | ICD-10-CM

## 2013-06-25 DIAGNOSIS — Z888 Allergy status to other drugs, medicaments and biological substances status: Secondary | ICD-10-CM

## 2013-06-25 DIAGNOSIS — E785 Hyperlipidemia, unspecified: Secondary | ICD-10-CM | POA: Diagnosis present

## 2013-06-25 DIAGNOSIS — R0600 Dyspnea, unspecified: Secondary | ICD-10-CM

## 2013-06-25 DIAGNOSIS — G4733 Obstructive sleep apnea (adult) (pediatric): Secondary | ICD-10-CM

## 2013-06-25 DIAGNOSIS — I4892 Unspecified atrial flutter: Secondary | ICD-10-CM

## 2013-06-25 DIAGNOSIS — I272 Pulmonary hypertension, unspecified: Secondary | ICD-10-CM

## 2013-06-25 DIAGNOSIS — Z6839 Body mass index (BMI) 39.0-39.9, adult: Secondary | ICD-10-CM

## 2013-06-25 DIAGNOSIS — J4489 Other specified chronic obstructive pulmonary disease: Secondary | ICD-10-CM | POA: Diagnosis present

## 2013-06-25 DIAGNOSIS — M542 Cervicalgia: Secondary | ICD-10-CM | POA: Diagnosis present

## 2013-06-25 DIAGNOSIS — I2789 Other specified pulmonary heart diseases: Secondary | ICD-10-CM | POA: Diagnosis present

## 2013-06-25 DIAGNOSIS — R0789 Other chest pain: Secondary | ICD-10-CM

## 2013-06-25 DIAGNOSIS — I50810 Right heart failure, unspecified: Secondary | ICD-10-CM

## 2013-06-25 DIAGNOSIS — J449 Chronic obstructive pulmonary disease, unspecified: Secondary | ICD-10-CM | POA: Diagnosis present

## 2013-06-25 DIAGNOSIS — I10 Essential (primary) hypertension: Secondary | ICD-10-CM | POA: Diagnosis present

## 2013-06-25 DIAGNOSIS — Z9981 Dependence on supplemental oxygen: Secondary | ICD-10-CM

## 2013-06-25 DIAGNOSIS — E876 Hypokalemia: Secondary | ICD-10-CM

## 2013-06-25 DIAGNOSIS — Z882 Allergy status to sulfonamides status: Secondary | ICD-10-CM

## 2013-06-25 DIAGNOSIS — F79 Unspecified intellectual disabilities: Secondary | ICD-10-CM | POA: Diagnosis present

## 2013-06-25 DIAGNOSIS — M25519 Pain in unspecified shoulder: Secondary | ICD-10-CM

## 2013-06-25 DIAGNOSIS — R1013 Epigastric pain: Secondary | ICD-10-CM

## 2013-06-25 DIAGNOSIS — R1110 Vomiting, unspecified: Secondary | ICD-10-CM

## 2013-06-25 DIAGNOSIS — E669 Obesity, unspecified: Secondary | ICD-10-CM

## 2013-06-25 DIAGNOSIS — I5032 Chronic diastolic (congestive) heart failure: Secondary | ICD-10-CM

## 2013-06-25 DIAGNOSIS — I509 Heart failure, unspecified: Secondary | ICD-10-CM

## 2013-06-25 DIAGNOSIS — M25512 Pain in left shoulder: Secondary | ICD-10-CM

## 2013-06-25 DIAGNOSIS — Z886 Allergy status to analgesic agent status: Secondary | ICD-10-CM

## 2013-06-25 DIAGNOSIS — R079 Chest pain, unspecified: Secondary | ICD-10-CM

## 2013-06-25 DIAGNOSIS — I4891 Unspecified atrial fibrillation: Secondary | ICD-10-CM | POA: Diagnosis present

## 2013-06-25 DIAGNOSIS — I279 Pulmonary heart disease, unspecified: Secondary | ICD-10-CM

## 2013-06-25 DIAGNOSIS — Z885 Allergy status to narcotic agent status: Secondary | ICD-10-CM

## 2013-06-25 DIAGNOSIS — I5033 Acute on chronic diastolic (congestive) heart failure: Secondary | ICD-10-CM | POA: Diagnosis present

## 2013-06-25 DIAGNOSIS — K219 Gastro-esophageal reflux disease without esophagitis: Principal | ICD-10-CM

## 2013-06-25 DIAGNOSIS — E1149 Type 2 diabetes mellitus with other diabetic neurological complication: Secondary | ICD-10-CM

## 2013-06-25 DIAGNOSIS — R112 Nausea with vomiting, unspecified: Secondary | ICD-10-CM

## 2013-06-25 DIAGNOSIS — J984 Other disorders of lung: Secondary | ICD-10-CM

## 2013-06-25 DIAGNOSIS — I2781 Cor pulmonale (chronic): Secondary | ICD-10-CM

## 2013-06-25 DIAGNOSIS — M62838 Other muscle spasm: Secondary | ICD-10-CM | POA: Diagnosis present

## 2013-06-25 DIAGNOSIS — E119 Type 2 diabetes mellitus without complications: Secondary | ICD-10-CM | POA: Diagnosis present

## 2013-06-25 HISTORY — DX: Other symptoms and signs involving cognitive functions and awareness: R41.89

## 2013-06-25 HISTORY — DX: Acute on chronic diastolic (congestive) heart failure: I50.33

## 2013-06-25 LAB — BASIC METABOLIC PANEL
BUN: 12 mg/dL (ref 6–23)
CALCIUM: 9.1 mg/dL (ref 8.4–10.5)
CO2: 26 mEq/L (ref 19–32)
Chloride: 98 mEq/L (ref 96–112)
Creatinine, Ser: 0.69 mg/dL (ref 0.50–1.10)
GFR calc non Af Amer: >90 mL/min (ref >90)
Glucose, Bld: 102 mg/dL — ABNORMAL HIGH (ref 70–99)
Potassium: 3.5 mEq/L — ABNORMAL LOW (ref 3.7–5.3)
SODIUM: 138 meq/L (ref 137–147)

## 2013-06-25 LAB — URINALYSIS, ROUTINE W REFLEX MICROSCOPIC
Bilirubin Urine: NEGATIVE
Glucose, UA: NEGATIVE mg/dL
Hgb urine dipstick: NEGATIVE
KETONES UR: NEGATIVE mg/dL
NITRITE: NEGATIVE
PH: 8 (ref 5.0–8.0)
Protein, ur: NEGATIVE mg/dL
Specific Gravity, Urine: 1.009 (ref 1.005–1.030)
Urobilinogen, UA: 1 mg/dL (ref 0.0–1.0)

## 2013-06-25 LAB — COMPREHENSIVE METABOLIC PANEL
ALT: 12 U/L (ref 0–35)
AST: 17 U/L (ref 0–37)
Albumin: 3.4 g/dL — ABNORMAL LOW (ref 3.5–5.2)
Alkaline Phosphatase: 95 U/L (ref 39–117)
BUN: 11 mg/dL (ref 6–23)
CALCIUM: 9.1 mg/dL (ref 8.4–10.5)
CO2: 27 meq/L (ref 19–32)
Chloride: 100 mEq/L (ref 96–112)
Creatinine, Ser: 0.71 mg/dL (ref 0.50–1.10)
GLUCOSE: 99 mg/dL (ref 70–99)
Potassium: 3.6 mEq/L — ABNORMAL LOW (ref 3.7–5.3)
Sodium: 140 mEq/L (ref 137–147)
Total Bilirubin: 0.2 mg/dL — ABNORMAL LOW (ref 0.3–1.2)
Total Protein: 7.1 g/dL (ref 6.0–8.3)

## 2013-06-25 LAB — CBC
HCT: 31.6 % — ABNORMAL LOW (ref 36.0–46.0)
HCT: 31.2 % — ABNORMAL LOW (ref 36.0–46.0)
Hemoglobin: 10.2 g/dL — ABNORMAL LOW (ref 12.0–15.0)
Hemoglobin: 10.1 g/dL — ABNORMAL LOW (ref 12.0–15.0)
MCH: 27.1 pg (ref 26.0–34.0)
MCH: 27.2 pg (ref 26.0–34.0)
MCHC: 32.3 g/dL (ref 30.0–36.0)
MCHC: 32.4 g/dL (ref 30.0–36.0)
MCV: 84 fL (ref 78.0–100.0)
MCV: 83.9 fL (ref 78.0–100.0)
PLATELETS: 547 10*3/uL — AB (ref 150–400)
Platelets: 522 10*3/uL — ABNORMAL HIGH (ref 150–400)
RBC: 3.76 MIL/uL — ABNORMAL LOW (ref 3.87–5.11)
RBC: 3.72 MIL/uL — AB (ref 3.87–5.11)
RDW: 15.9 % — AB (ref 11.5–15.5)
RDW: 15.9 % — ABNORMAL HIGH (ref 11.5–15.5)
WBC: 8.8 10*3/uL (ref 4.0–10.5)
WBC: 8.4 10*3/uL (ref 4.0–10.5)

## 2013-06-25 LAB — I-STAT TROPONIN, ED: TROPONIN I, POC: 0.01 ng/mL (ref 0.00–0.08)

## 2013-06-25 LAB — URINE MICROSCOPIC-ADD ON

## 2013-06-25 LAB — TROPONIN I
Troponin I: <0.3 ng/mL (ref <0.30)
Troponin I: <0.3 ng/mL (ref <0.30)

## 2013-06-25 LAB — PRO B NATRIURETIC PEPTIDE: PRO B NATRI PEPTIDE: 149 pg/mL — AB (ref 0–125)

## 2013-06-25 LAB — GLUCOSE, CAPILLARY
GLUCOSE-CAPILLARY: 89 mg/dL (ref 70–99)
GLUCOSE-CAPILLARY: 93 mg/dL (ref 70–99)
Glucose-Capillary: 90 mg/dL (ref 70–99)

## 2013-06-25 LAB — MRSA PCR SCREENING: MRSA BY PCR: POSITIVE — AB

## 2013-06-25 LAB — MAGNESIUM: MAGNESIUM: 2.1 mg/dL (ref 1.5–2.5)

## 2013-06-25 MED ORDER — SODIUM CHLORIDE 0.9 % IJ SOLN: 80.0000 ng/kg/min | INTRAMUSCULAR | Status: DC

## 2013-06-25 MED ORDER — TADALAFIL 20 MG PO TABS
20.0000 mg | ORAL_TABLET | Freq: Three times a day (TID) | ORAL | Status: DC
  Administered 2013-06-25 (×2): 20 mg via ORAL
  Filled 2013-06-25 (×3): qty 1

## 2013-06-25 MED ORDER — LORATADINE 10 MG PO TABS
10.0000 mg | ORAL_TABLET | Freq: Every day | ORAL | Status: DC
  Administered 2013-06-25: 10 mg via ORAL
  Filled 2013-06-25: qty 1

## 2013-06-25 MED ORDER — INSULIN ASPART 100 UNIT/ML ~~LOC~~ SOLN: 0.0000 [IU] | Freq: Three times a day (TID) | SUBCUTANEOUS | Status: DC

## 2013-06-25 MED ORDER — SPIRONOLACTONE 12.5 MG HALF TABLET
12.5000 mg | ORAL_TABLET | Freq: Every day | ORAL | Status: DC
  Administered 2013-06-25: 12.5 mg via ORAL
  Filled 2013-06-25: qty 1

## 2013-06-25 MED ORDER — NON FORMULARY
80.0000 ng/kg/min | Status: DC
  Administered 2013-06-25: 80 ng/kg/min via INTRAVENOUS

## 2013-06-25 MED ORDER — SODIUM CHLORIDE 0.9 % IJ SOLN
3.0000 mL | Freq: Two times a day (BID) | INTRAMUSCULAR | Status: DC
  Administered 2013-06-25: 3 mL via INTRAVENOUS

## 2013-06-25 MED ORDER — RIVAROXABAN 20 MG PO TABS
20.0000 mg | ORAL_TABLET | Freq: Every day | ORAL | Status: DC
  Filled 2013-06-25: qty 1

## 2013-06-25 MED ORDER — GABAPENTIN 300 MG PO CAPS
600.0000 mg | ORAL_CAPSULE | Freq: Three times a day (TID) | ORAL | Status: DC
  Administered 2013-06-25 (×2): 600 mg via ORAL
  Filled 2013-06-25 (×3): qty 2

## 2013-06-25 MED ORDER — PANTOPRAZOLE SODIUM 20 MG PO TBEC
20.0000 mg | DELAYED_RELEASE_TABLET | Freq: Two times a day (BID) | ORAL | Status: DC
  Administered 2013-06-25: 20 mg via ORAL
  Filled 2013-06-25 (×2): qty 1

## 2013-06-25 MED ORDER — POTASSIUM CHLORIDE CRYS ER 20 MEQ PO TBCR
40.0000 meq | EXTENDED_RELEASE_TABLET | Freq: Three times a day (TID) | ORAL | Status: DC
  Administered 2013-06-25 (×2): 40 meq via ORAL
  Filled 2013-06-25 (×2): qty 2

## 2013-06-25 MED ORDER — FENTANYL CITRATE 0.05 MG/ML IJ SOLN
6.2500 ug | INTRAMUSCULAR | Status: DC | PRN
Start: 2013-06-25 — End: 2013-06-25

## 2013-06-25 MED ORDER — ACETAMINOPHEN 500 MG PO TABS
500.0000 mg | ORAL_TABLET | ORAL | Status: DC | PRN
  Administered 2013-06-25: 500 mg via ORAL
  Filled 2013-06-25: qty 1

## 2013-06-25 MED ORDER — TORSEMIDE 20 MG PO TABS
80.0000 mg | ORAL_TABLET | Freq: Every day | ORAL | Status: DC
  Administered 2013-06-25: 80 mg via ORAL
  Filled 2013-06-25: qty 4

## 2013-06-25 MED ORDER — SIMVASTATIN 10 MG PO TABS
10.0000 mg | ORAL_TABLET | Freq: Every day | ORAL | Status: DC
  Filled 2013-06-25: qty 1

## 2013-06-25 MED ORDER — NON FORMULARY: 80.0000 ng/kg/min | Status: DC

## 2013-06-25 NOTE — ED Notes (Addendum)
Patient here via EMS with complaint of sudden right shoulder pain and shortness of breath which started while watching TV. Additionally had one incident of nausea while in EMS truck. Zofran was given. Shoulder pain and SOB had resolved prior to arrival. Significant history of Pulmonary Hypertension and CHF. Currently has continuous infusion pump connected to hickman catheter in right chest.

## 2013-06-25 NOTE — H&P (Signed)
INTERNAL MEDICINE TEACHING ATTENDING NOTE  Day 0 of stay  Patient name: [name]  MRN: [id number] Date of birth: 12-Aug-1960   53 y.o.woman with mild cognitive imapirment, obesity, OSA, type 2 DM, COPD on 4l of home oxygen, diastolic HF decompensated, atrial fib on xarelto, severe pulmonary hypertension on treprostinil, comes in with shoulder pain, and nausea and vomiting.  I met the patient in the morning and she felt that her pain was reduced since yesterday. The nature of the pain is reported to be flash like, lasting a few seconds. She complained that she experiences musculoskeletal pains all over her body intermittently. This new pain was located around her hickman catheter through which she gets treprostinil. She denies shortness of breath, palpitations, chest heaviness.   Filed Vitals:   06/25/13 0541 06/25/13 0545 06/25/13 0632 06/25/13 1409  BP:  106/53 96/53 102/56  Pulse:  74 76 75  Temp: 98.3 F (36.8 C)  98.1 F (36.7 C) 97.9 F (36.6 C)  TempSrc: Oral   Oral  Resp:  _0 Height:   _1  (1.626 m)   Weight:   231 lb 8 oz (105.008 kg)   SpO2:  93% 100% 99%    Physical Exam   General: Resting in bed. No distress.  HEENT: PERRL, EOMI, no scleral icterus. Heart: RRR, no rubs, murmurs or gallops. Lungs: Clear to auscultation bilaterally, no wheezes, rales, or rhonchi. Catheter area on chest below right shoulder reveals no tenderness, erythema, swelling.  Abdomen: Soft, nontender, nondistended, BS present. Extremities: Warm, no pedal edema. Neuro: Alert and oriented X3, cranial nerves II-XII grossly intact,  strength and sensation to light touch equal in bilateral upper and lower extremities Psych: Normal thought content and behaviour. Normal affect.  Reviewed labs and images. Reviewed EKG.    Assessment and Plan    The right neck/shoulder pain (now resolved) seems musculoskeletal in my opinion, it couls also be a side effect of treprostinil (limb pain in up  to 14% patients). The patient is ruled out of AMI, and cardiology has reviewed the case. She is at the baseline for her dyspnea. We will discharge the patient back home with cardiology recommendations. Her planned repeat ECHO is 6/26. She has PCP appointment with Dr Ronnald Ramp tomorrow for immediate follow up.   I have seen and evaluated this patient and discussed it with my IM resident team.  Please see the rest of the plan per resident note from today.   Madilyn Fireman 06/25/2013, 2:27 PM.

## 2013-06-25 NOTE — Discharge Instructions (Signed)
Please take you medications as prescribed.  Be sure to keep your follow up with Dr. Ronnald Ramp tomorrow 06/26/13 at 3:15pm.   Chest Pain (Nonspecific) It is often hard to give a diagnosis for the cause of chest pain. There is always a chance that your pain could be related to something serious, such as a heart attack or a blood clot in the lungs. You need to follow up with your doctor. HOME CARE  If antibiotic medicine was given, take it as directed by your doctor. Finish the medicine even if you start to feel better.  For the next few days, avoid activities that bring on chest pain. Continue physical activities as told by your doctor.  Do not use any tobacco products. This includes cigarettes, chewing tobacco, and e-cigarettes.  Avoid drinking alcohol.  Only take medicine as told by your doctor.  Follow your doctor's suggestions for more testing if your chest pain does not go away.  Keep all doctor visits you made. GET HELP IF:  Your chest pain does not go away, even after treatment.  You have a rash with blisters on your chest.  You have a fever. GET HELP RIGHT AWAY IF:   You have more pain or pain that spreads to your arm, neck, jaw, back, or belly (abdomen).  You have shortness of breath.  You cough more than usual or cough up blood.  You have very bad back or belly pain.  You feel sick to your stomach (nauseous) or throw up (vomit).  You have very bad weakness.  You pass out (faint).  You have chills. This is an emergency. Do not wait to see if the problems will go away. Call your local emergency services (911 in [place] Do not drive yourself to the hospital. MAKE SURE YOU:   Understand these instructions.  Will watch your condition.  Will get help right away if you are not doing well or get worse. Document Released: 06/09/2007 Document Revised: 12/26/2012 Document Reviewed: 06/09/2007 [facility] Patient Information 2015 [place], [place]. This information is not  intended to replace advice given to you by your health care provider. Make sure you discuss any questions you have with your health care provider.  Heart Failure Heart failure means your heart has trouble pumping blood. This makes it hard for your body to work well. Heart failure is usually a long-term (chronic) condition. You must take good care of yourself and follow your doctor's treatment plan. HOME CARE  Take your heart medicine as told by your doctor.  Do not stop taking medicine unless your doctor tells you to.  Do not skip any dose of medicine.  Refill your medicines before they run out.  Take other medicines only as told by your doctor or pharmacist.  Stay active if told by your doctor. The elderly and people with severe heart failure should talk with a doctor about physical activity.  Eat heart healthy foods. Choose foods that are without trans fat and are low in saturated fat, cholesterol, and salt (sodium). This includes fresh or frozen fruits and vegetables, fish, lean meats, fat-free or low-fat dairy foods, whole grains, and high-fiber foods. Lentils and dried peas and beans (legumes) are also good choices.  Limit salt if told by your doctor.  Cook in a healthy way. Roast, grill, broil, bake, poach, steam, or stir-fry foods.  Limit fluids as told by your doctor.  Weigh yourself every morning. Do this after you pee (urinate) and before you eat breakfast. Write down  your weight to give to your doctor.  Take your blood pressure and write it down if your doctor tell you to.  Ask your doctor how to check your pulse. Check your pulse as told.  Lose weight if told by your doctor.  Stop smoking or chewing tobacco. Do not use gum or patches that help you quit without your doctor's approval.  Schedule and go to doctor visits as told.  Nonpregnant women should have no more than 1 drink a day. Men should have no more than 2 drinks a day. Talk to your doctor about drinking  alcohol.  Stop illegal drug use.  Stay current with shots (immunizations).  Manage your health conditions as told by your doctor.  Learn to manage your stress.  Rest when you are tired.  If it is really hot outside:  Avoid intense activities.  Use air conditioning or fans, or get in a cooler place.  Avoid caffeine and alcohol.  Wear loose-fitting, lightweight, and light-colored clothing.  If it is really cold outside:  Avoid intense activities.  Layer your clothing.  Wear mittens or gloves, a hat, and a scarf when going outside.  Avoid alcohol.  Learn about heart failure and get support as needed.  Get help to maintain or improve your quality of life and your ability to care for yourself as needed. GET HELP IF:   You gain 03 lb/1.4 kg or more in 1 day or 05 lb/2.3 kg in a week.  You are more short of breath than usual.  You cannot do your normal activities.  You tire easily.  You cough more than normal, especially with activity.  You have any or more puffiness (swelling) in areas such as your hands, feet, ankles, or belly (abdomen).  You cannot sleep because it is hard to breathe.  You feel like your heart is beating fast (palpitations).  You get dizzy or lightheaded when you stand up. GET HELP RIGHT AWAY IF:   You have trouble breathing.  There is a change in mental status, such as becoming less alert or not being able to focus.  You have chest pain or discomfort.  You faint. MAKE SURE YOU:   Understand these instructions.  Will watch your condition.  Will get help right away if you are not doing well or get worse. Document Released: 09/30/2007 Document Revised: 04/17/2012 Document Reviewed: 07/22/2011 [facility] Patient Information 2015 [place], [place]. This information is not intended to replace advice given to you by your health care provider. Make sure you discuss any questions you have with your health care provider.  Pulmonary  Hypertension Pulmonary hypertension is high blood pressure within the arteries in your lungs (pulmonary arteries). It is different than having high blood pressure elsewhere in your body, such as blood pressure that is measured with a blood pressure cuff. Pulmonary hypertension makes it harder for blood to flow through the lungs. As a result, the heart must work harder to pump blood through the lungs, and it may be harder for you to breathe. Over time, this can weaken the heart muscle. Pulmonary hypertension is a serious condition and can be fatal.  CAUSES Many different medical conditions can cause pulmonary hypertension. They can be grouped into five different categories:  Group 1--Pulmonary hypertension caused by abnormal growth of small blood vessels in the lungs (pulmonary arterial hypertension). The abnormal blood vessel growth may have no known cause or may be:   Hereditary.  Caused by another disease such as a connective  tissue disease (including lupus or scleroderma) or HIV.  Caused by certain drugs or toxins. Group 2--Pulmonary hypertension caused by weakness of the main chamber of the heart (left ventricle) or heart valve disease. Group 3--Pulmonary hypertension caused by lung disease or low oxygen levels. Causes in this group include:  Emphysema or chronic obstructive pulmonary disease (COPD).  Untreated sleep apnea.  Pulmonary fibrosis. Group 4--Pulmonary hypertension caused by blood clots in the lungs (pulmonary emboli).  Group 5--Other causes of pulmonary hypertension, such as sickle cell anemia, or a mix between multiple causes. SIGNS AND SYMPTOMS  Shortness of breath. You may notice shortness of breath with:  Activity such as walking.  No activity.  Tiredness and fatigue.  Dizziness or fainting.  Rapid heartbeat or feeling the heart flutter or skip a beat (palpitations).  Neck vein enlargement.  Bluish color to the lips and fingertips. DIAGNOSIS  Various tests  may be used to help diagnose pulmonary hypertension. These can include:  Chest X-ray.  Arterial blood gases. This test checks the oxygen level in your blood.  CT scans. This test can provide detailed images of your lungs.  Pulmonary function test. This test measures how much air your lungs can hold. It also tests how well air moves in and out of your lungs.  Electrocardiography. This test traces the electrical activity of your heart.  Echocardiography. This test is used to look at your heart in motion and how it functions.  Heart catheterization. This test can measure the pressure in your pulmonary artery and the right side of the heart.  Lung biopsy. A tissue sample is sometimes taken from the lung to check for underlying causes. TREATMENT Pulmonary hypertension has no cure. Treatment is to help relieve symptoms and slow the progress of the condition. Treatment can involve:  Medicines such as:  Blood pressure medicines.  Medicines to relax (dilate) the pulmonary blood vessels.  Diuretic medicines.  Blood thinning medicines.  Surgery. For severe pulmonary hypertension that does not respond to medical treatment, heart-lung or lung transplant may be needed. HOME CARE INSTRUCTIONS  Only take over-the-counter or prescription medicines as directed by your health care provider. Take all medicines exactly as instructed. Do not change or stop medicines without first checking with your health care provider.  Do not smoke.  Eat a healthy diet. Decrease how much salt (sodium) you eat by checking nutrition labels and using seasonings without salt. Talk to your health care provider or a dietitian about foods you should eat.  Stay as active as possible. Exercise as directed by your health care provider. Talk to your health care provider about what type of exercise is safe for you.  Avoid high altitudes.  Avoid hot tubs and saunas.  Avoid becoming pregnant, if this applies. Talk to your  health care provider about safe methods of birth control.  Follow up with your health care provider as directed. SEEK IMMEDIATE MEDICAL CARE IF:  You have severe shortness of breath.  You develop chest pain or pressure.  You cough up blood.  You develop swelling of your feet or legs.  You have a significant increase in weight over 1-2 days. Document Released: 10/18/2006 Document Revised: 10/11/2012 Document Reviewed: 06/12/2012 [facility] Patient Information 2015 [place], [place]. This information is not intended to replace advice given to you by your health care provider. Make sure you discuss any questions you have with your health care provider.

## 2013-06-25 NOTE — ED Provider Notes (Signed)
CSN: [id number]     Arrival date & time 06/25/13  0132 History   First MD Initiated Contact with Patient 06/25/13 0151     Chief Complaint  Patient presents with  . Shoulder Pain  . Shortness of Breath      Patient is a 53 y.o. female presenting with shoulder pain and shortness of breath. The history is provided by the patient, the EMS personnel and a relative. The history is limited by the condition of the patient (Cognitive impairment).  Shoulder Pain Associated symptoms include shortness of breath.  Shortness of Breath Pt was seen at 0230. Per EMS, family and pt report: c/o gradual onset and persistence of waxing and waning mid-sternal chest "pain" since approximately 1800 last night. Described the discomfort as "pressure." CP was associated with SOB and N/V. Pt feels improved on arrival to the ED. Pt currently is on IV Remodulin and PO Adcirca by her [facility]ologist. Pt also states she "turned by head wrong I think" and now feels "a spasm in my right neck." Denies palpitations, no cough, no abd pain, no N/V/D, no back pain, no focal motor weakness, no tingling/numbness in extremities.    Cards: Dr. Haroldine Laws Past Medical History  Diagnosis Date  . OSA (obstructive sleep apnea)     CPAP  . Venous stasis ulcer     chornic, ?followed up at wound care center, multiple courses of antibiotics in past for cellulitis, on lasix  . GERD (gastroesophageal reflux disease)   . Anemia, iron deficiency     secondary to menhorrhagia, on oral iron, also b12 def, getting monthly b12 shots  . Chronic cough     secondary to alleriges and post nasal drip  . Depression   . Diabetes mellitus     well controlled on metformin  . Hyperlipidemia   . H/O mental retardation   . Herpes   . Cor pulmonale     PA Peak pressure 46mHg  . Diastolic heart failure   . Restrictive lung disease     PFTs 06/2012 (FVC 54% predicted and FEV1 68% predicted w minimal bronchodilator response).  . CHF (congestive  heart failure)   . Renal disorder   . Pulmonary hypertension   . Hypertension   . Shortness of breath   . COPD (chronic obstructive pulmonary disease)   . Cognitive impairment    Past Surgical History  Procedure Laterality Date  . Cholecystectomy     Family History  Problem Relation Age of Onset  . Mental illness Sister   . Mental retardation Brother    History  Substance Use Topics  . Smoking status: Never Smoker   . Smokeless tobacco: Never Used  . Alcohol Use: No   OB History   Grav Para Term Preterm Abortions TAB SAB Ect Mult Living   _0 Review of Systems  Unable to perform ROS: Other  Respiratory: Positive for shortness of breath.      Allergies  Aspirin; Codeine; Lisinopril; and Sulfonamide derivatives  Home Medications   Prior to Admission medications   Medication Sig Start Date End Date Taking? Authorizing Provider  cyanocobalamin 500 MCG tablet Take 500 mcg by mouth daily.   Yes Historical Provider, MD  gabapentin (NEURONTIN) 300 MG capsule Take 2 capsules (600 mg total) by mouth 3 (three) times daily. 03/20/13  Yes ECorky Sox MD  loperamide (IMODIUM) 2 MG capsule Take 2 mg by mouth daily  as needed for diarrhea or loose stools.   Yes Historical Provider, MD  loratadine (CLARITIN) 10 MG tablet Take 10 mg by mouth daily.   Yes Historical Provider, MD  metFORMIN (GLUCOPHAGE) 1000 MG tablet Take 500 mg by mouth daily with breakfast. 05/17/13  Yes Corky Sox, MD  pantoprazole (PROTONIX) 20 MG tablet Take 20 mg by mouth 2 (two) times daily.   Yes Historical Provider, MD  potassium chloride SA (K-DUR,KLOR-CON) 20 MEQ tablet Take 40 mEq by mouth 3 (three) times daily. Take extra tab when take an extra Torsemide 11/23/12  Yes Jolaine Artist, MD  pravastatin (PRAVACHOL) 20 MG tablet Take 1 tablet (20 mg total) by mouth daily. 09/01/12  Yes Rande Brunt, NP  Rivaroxaban (XARELTO) 20 MG TABS tablet Take 1 tablet (20 mg total) by mouth daily with  supper. 02/13/13  Yes [name] Bensimhon, MD  sodium chloride 0.9 % SOLN with treprostinil 1 MG/ML SOLN Inject 80 ng/kg/min into the vein continuous. Dose 80ng/kg/min based on dosing wt of 94kg. 70m/24hours   Yes Historical Provider, MD  spironolactone (ALDACTONE) 25 MG tablet Take 0.5 tablets (12.5 mg total) by mouth daily. 02/13/13  Yes DJolaine Artist MD  tadalafil (CIALIS) 20 MG tablet Take 20 mg by mouth 3 (three) times daily.   Yes Historical Provider, MD  torsemide (DEMADEX) 20 MG tablet Take 80 mg by mouth daily. Take extra tab as needed for weight gain 10/03/12  Yes ARande Brunt NP   BP 113/70  Pulse 77  Resp 32  Ht 5' 4" (1.626 m)  Wt 230 lb 12.8 oz (104.69 kg)  BMI 39.60 kg/m2  SpO2 100% Physical Exam 0235: Physical examination:  Nursing notes reviewed; Vital signs and O2 SAT reviewed;  Constitutional: Well developed, Well nourished, Well hydrated, In no acute distress; Head:  Normocephalic, atraumatic; Eyes: EOMI, PERRL, No scleral icterus; ENMT: Mouth and pharynx normal, Mucous membranes moist; Neck: Supple, Full range of motion, No lymphadenopathy; Cardiovascular: Regular rate and rhythm, No gallop; Respiratory: Breath sounds coarse & equal bilaterally, No wheezes.  Speaking full sentences with ease, Normal respiratory effort/excursion; Chest: Nontender, Movement normal; Abdomen: Soft, Nontender, Nondistended, Normal bowel sounds; Spine:  No midline CS, TS, LS tenderness. +TTP right hypertonic trapezius muscle. No rash, no soft tissue crepitus.;; Genitourinary: No CVA tenderness; Extremities: Pulses normal, No tenderness, +2 pedal edema bilat without calf asymmetry.; Neuro: AA&Ox3, vague/rambling historian. Major CN grossly intact.  Speech clear. No gross focal motor or sensory deficits in extremities.; Skin: Color normal, Warm, Dry.    ED Course  Procedures     EKG Interpretation   Date/Time:  Monday June 25 2013 01:43:56 EDT Ventricular Rate:  78 PR Interval:   231 QRS Duration: 79 QT Interval:  424 QTC Calculation: 483 R Axis:   113 Text Interpretation:  Sinus rhythm with 1st degree A-V block Right axis  deviation Low voltage, precordial leads RSR' in V1 or V2, probably normal  variant Nonspecific ST and T wave abnormality When compared with ECG of  02/12/2013 No significant change was found Confirmed by [facility] MD,  KNunzio Cory([phone] on 06/25/2013 3:18:26 AM      MDM  MDM Reviewed: previous chart, nursing note and vitals Reviewed previous: labs and ECG Interpretation: labs, ECG and x-ray    Results for orders placed during the hospital encounter of 06/25/13  CBC      Result Value Ref Range   WBC 8.8  4.0 - 10.5 K/uL  RBC 3.76 (*) 3.87 - 5.11 MIL/uL   Hemoglobin 10.2 (*) 12.0 - 15.0 g/dL   HCT 31.6 (*) 36.0 - 46.0 %   MCV 84.0  78.0 - 100.0 fL   MCH 27.1  26.0 - 34.0 pg   MCHC 32.3  30.0 - 36.0 g/dL   RDW 15.9 (*) 11.5 - 15.5 %   Platelets 547 (*) 150 - 400 K/uL  BASIC METABOLIC PANEL      Result Value Ref Range   Sodium 138  137 - 147 mEq/L   Potassium 3.5 (*) 3.7 - 5.3 mEq/L   Chloride 98  96 - 112 mEq/L   CO2 26  19 - 32 mEq/L   Glucose, Bld 102 (*) 70 - 99 mg/dL   BUN 12  6 - 23 mg/dL   Creatinine, Ser 0.69  0.50 - 1.10 mg/dL   Calcium 9.1  8.4 - 10.5 mg/dL   GFR calc non Af Amer >90  >90 mL/min   GFR calc Af Amer >90  >90 mL/min  PRO B NATRIURETIC PEPTIDE      Result Value Ref Range   Pro B Natriuretic peptide (BNP) 149.0 (*) 0 - 125 pg/mL  I-STAT TROPOININ, ED      Result Value Ref Range   Troponin i, poc 0.01  0.00 - 0.08 ng/mL   Comment 3            Dg Chest 2 View 06/25/2013   CLINICAL DATA:  Shoulder pain and shortness of breath.  EXAM: CHEST  2 VIEW  COMPARISON:  01/19/2013  FINDINGS: Right central line tip is in the upper margin of the right atrium. Mildly enlarged cardiopericardial silhouette. Thoracic spondylosis.  No pneumothorax. No clinically significant pleural effusion. No overt edema.  IMPRESSION: 1.  Mild cardiomegaly, without edema. 2. Right IJ line tip: Upper right atrium.   Electronically Signed   By: Sherryl Barters M.D.   On: 06/25/2013 02:29    0400:  Denies CP while in the ED. Fentanyl given for pt c/o trapezius muscle spasm.  Dx and testing d/w pt and family.  Questions answered.  Verb understanding, agreeable to admit. T/C to Cardiology MD, case discussed, including:  HPI, pertinent PM/SHx, VS/PE, dx testing, ED course and treatment:  Agreeable to consult, requests to admit to medicine service. T/C to [facility] Resident, case discussed, including:  HPI, pertinent PM/SHx, VS/PE, dx testing, ED course and treatment:  Agreeable to admit, requests to write temporary orders, obtain tele bed to Dr. Ellwood Dense.   Alfonzo Feller, DO 06/27/13 1752

## 2013-06-25 NOTE — Progress Notes (Signed)
MRSA swab came back positive.  Contact precautions maintained.

## 2013-06-25 NOTE — Progress Notes (Signed)
Subjective: Patient reports she is feeling better, not currently having any chest pain.  She does admit some mild diffuse myalgias especially in her shoulders and arms.  She denies SOB, nausea, or diaphoresis. Objective: Vital signs in last 24 hours: Filed Vitals:   06/25/13 0530 06/25/13 0541 06/25/13 0545 06/25/13 0632  BP: 111/65  106/53 96/53  Pulse: 72  74 76  Temp:  98.3 F (36.8 C)  98.1 F (36.7 C)  TempSrc:  Oral    Resp: _0 Height:    _1  (1.626 m)  Weight:    231 lb 8 oz (105.008 kg)  SpO2: 97%  93% 100%   Weight change:  No intake or output data in the 24 hours ending 06/25/13 0730 General: resting in bed, NAD Cardiac: RRR, 3/6 systolic murmur Chest: catheter in place in right chest, mild tenderness with palpation, no surrounding errythema Pulm: CTAB no w/r/r Abd: soft, nontender, nondistended, BS present Ext: warm and well perfused Neuro: alert and oriented X3, cranial nerves II-XII grossly intact  Lab Results: Basic Metabolic Panel:  Recent Labs Lab 06/25/13 0144  NA 138  K 3.5*  CL 98  CO2 26  GLUCOSE 102*  BUN 12  CREATININE 0.69  CALCIUM 9.1   CBC:  Recent Labs Lab 06/25/13 0144 06/25/13 0700  WBC 8.8 8.4  HGB 10.2* 10.1*  HCT 31.6* 31.2*  MCV 84.0 83.9  PLT 547* 522*   BNP:  Recent Labs Lab 06/25/13 0144  PROBNP 149.0*   CBG:  Recent Labs Lab 06/25/13 0640  GLUCAP 89    Micro Results: No results found for this or any previous visit (from the past 240 hour(s)). Studies/Results: Dg Chest 2 View  06/25/2013   CLINICAL DATA:  Shoulder pain and shortness of breath.  EXAM: CHEST  2 VIEW  COMPARISON:  01/19/2013  FINDINGS: Right central line tip is in the upper margin of the right atrium. Mildly enlarged cardiopericardial silhouette. Thoracic spondylosis.  No pneumothorax. No clinically significant pleural effusion. No overt edema.  IMPRESSION: 1. Mild cardiomegaly, without edema. 2. Right IJ line tip: Upper right  atrium.   Electronically Signed   By: Sherryl Barters M.D.   On: 06/25/2013 02:29   Medications: I have reviewed the patient's current medications. Scheduled Meds: . gabapentin  600 mg Oral TID  . insulin aspart  0-9 Units Subcutaneous TID WC  . loratadine  10 mg Oral Daily  . NON FORMULARY  80 ng/kg/min Intravenous Q24H  . pantoprazole  20 mg Oral BID  . potassium chloride SA  40 mEq Oral TID  . rivaroxaban  20 mg Oral Q supper  . simvastatin  10 mg Oral q1800  . sodium chloride  3 mL Intravenous Q12H  . spironolactone  12.5 mg Oral Daily  . tadalafil  20 mg Oral TID  . torsemide  80 mg Oral Daily   Continuous Infusions:  PRN Meds:.acetaminophen Assessment/Plan: Right neck/shoulder pain extending to the right upper chest (site of her catheter): Chest pain has resolved, she does still have some mild tenderness at catheter site but no signs of infection. Cardiology/ Bensimon with heart failure has evaluated do not suspect cardiac cause.  - Patient has had 3 negative troponin's over 10 hour span.  Do not suspect cardiac etiology. - No signs or symptoms of infection at cath site. - Patient safe/stable for discharge she will follow up in the [facility] tomorrow morning  Epigastric discomfort/vomiting:Improved - continue protonix  Hypokalemia:  - continue Kdur and sprinolactone   PAH:  - continue treprostinil  - she has an appointment to see Dr. Gilles Chiquito at [facility] on 07/01   Chronic diastolic HF: Does not appear to be acute exacerbation. - continue Demadex and spironolactone  - already scheduled for repeat ECHO on 06/26   Restrictive lung disease:  - continue home oxygen 4L   PAF: NSR currently  - continue Xarelto 31m daily   OSA:  - continue CPAP  ? Blood in stool: VSS, Hgb at baseline - Consider FOBT testing as outpatient   Diet: Heart healthy/carb mod  VTE: On Xarelto  Code: Full  Dispo: Discharge home   The patient does have a current PCP (Corky Sox MD) and [facility]  [facility].  The patient does not know have transportation limitations that hinder transportation to clinic appointments.  .Services Needed at time of discharge: Y = Yes, Blank = No PT:   OT:   RN:   Equipment:   Other:     LOS: 0 days   EJoni Reining DO 06/25/2013, 7:30 AM

## 2013-06-25 NOTE — Discharge Summary (Signed)
Name: [name] MRN: [id number] DOB: 19-Apr-1960 53 y.o. PCP: Corky Sox, MD  Date of Admission: 06/25/2013  1:32 AM Date of Discharge: 06/25/2013 Attending Physician: Madilyn Fireman, MD  Discharge Diagnosis: Principal Problem:   Chest pain Active Problems:   MENTAL RETARDATION   SLEEP APNEA, OBSTRUCTIVE   GERD   Obesity   Type II or unspecified type diabetes mellitus with neurological manifestations, not stated as uncontrolled(250.60)   Cor pulmonale, chronic   Restrictive lung disease   Atrial flutter   Pulmonary HTN   Hypokalemia   Nausea and vomiting   Dyspnea   Trapezius muscle spasm   Right heart failure, NYHA class 2   Chronic diastolic CHF (congestive heart failure)   Right shoulder pain   Neck pain on right side  Discharge Medications:   Medication List         cyanocobalamin 500 MCG tablet  Take 500 mcg by mouth daily.     gabapentin 300 MG capsule  Commonly known as:  NEURONTIN  Take 2 capsules (600 mg total) by mouth 3 (three) times daily.     loperamide 2 MG capsule  Commonly known as:  IMODIUM  Take 2 mg by mouth daily as needed for diarrhea or loose stools.     loratadine 10 MG tablet  Commonly known as:  CLARITIN  Take 10 mg by mouth daily.     metFORMIN 1000 MG tablet  Commonly known as:  GLUCOPHAGE  Take 500 mg by mouth daily with breakfast.     pantoprazole 20 MG tablet  Commonly known as:  PROTONIX  Take 20 mg by mouth 2 (two) times daily.     potassium chloride SA 20 MEQ tablet  Commonly known as:  K-DUR,KLOR-CON  Take 40 mEq by mouth 3 (three) times daily. Take extra tab when take an extra Torsemide     pravastatin 20 MG tablet  Commonly known as:  PRAVACHOL  Take 1 tablet (20 mg total) by mouth daily.     rivaroxaban 20 MG Tabs tablet  Commonly known as:  XARELTO  Take 1 tablet (20 mg total) by mouth daily with supper.     sodium chloride 0.9 % SOLN with treprostinil 1 MG/ML SOLN  Inject 80 ng/kg/min into the vein  continuous. Dose 80ng/kg/min based on dosing wt of 92kg. 30m/24hours     spironolactone 25 MG tablet  Commonly known as:  ALDACTONE  Take 0.5 tablets (12.5 mg total) by mouth daily.     tadalafil 20 MG tablet  Commonly known as:  CIALIS  Take 20 mg by mouth 3 (three) times daily.     torsemide 20 MG tablet  Commonly known as:  DEMADEX  Take 80 mg by mouth daily. Take extra tab as needed for weight gain        Disposition and follow-up:   [facility]  At the hospital follow up visit please address:  1. Has patient's right side chest pain recurred?  Patient continuing to have some generalized aches and pain of shoulders?    2. Reevaluate catheter site.   3.  Labs / imaging needed at time of follow-up: FOBT testing (patient reported possible bloody stools)  4.  Pending labs/ test needing follow-up: None  Follow-up Appointments:     Follow-up Information   Follow up with JLuanne Bras MD On 06/25/2013. (at 3:15pm)    Specialty:  Internal Medicine  Contact information:   [place] [address] [phone]       Discharge Instructions:   Consultations:    Procedures Performed:  Dg Chest 2 View  06/25/2013   CLINICAL DATA:  Shoulder pain and shortness of breath.  EXAM: CHEST  2 VIEW  COMPARISON:  01/19/2013  FINDINGS: Right central line tip is in the upper margin of the right atrium. Mildly enlarged cardiopericardial silhouette. Thoracic spondylosis.  No pneumothorax. No clinically significant pleural effusion. No overt edema.  IMPRESSION: 1. Mild cardiomegaly, without edema. 2. Right IJ line tip: Upper right atrium.   Electronically Signed   By: Sherryl Barters M.D.   On: 06/25/2013 02:29    Admission HPI: [name] is a 53 year old woman with a PMH of chronic dHF (55-60%, grade 1 dd 02/2013, followed by Dr. 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 She presents with right neck pain extending to the right upper chest catheter site. The pain came on abruptly while she was laying in bed on the night of admission. The pain was 8/10, sharp, intermittent and lasting 2-3 seconds. She also notes dyspnea/chills/shaking. She said she had belching, felt as if she was having heartburn/indigestion and felt tightness across her upper abdomen. EMS was called. She did not feel nauseous but had a sudden episode of vomiting while in the ambulance. Still having intermittent pain in right neck/shoulder pain in the ED but breathing comfortably on home oxygen dose of 4L/min. N/V have resolved.  In the ED: T 98.41F, RR 24, SpO2 100% on 4L, HR 87, BP 116/6mHg; cardiology was consulted and IMTS was called for admission.    Hospital Course by problem list: Chest Pain -Right neck/shoulder pain extending to the right upper chest (site of her catheter):  Patient presented with atypical chest pain most notable around the site of her catheter, but also associated with some epigastric discomfort and vomiting.  She has had some generalized myalgias thought possibly due to ADR of treprostinil which she takes for pulmonary HTN. However given her multiple comorbidities she was admitted and ruled out for ACS.  She was also treated with protonix for possible GERD related chest pain. Her chest pain resolved, and she was evaluated by Cardiology as well as her heart failure physician Dr. BHaroldine Lawswho both felt the pain was not of cardiac orgin, on discharge she did still have some mild tenderness at catheter site but no signs of infection, and VSS. She was [facility]   Epigastric discomfort/vomiting - Continued home protonix   Hypokalemia:  - continue home dose of Kdur and sprinolactone    PAH:  - Continued on  Treprostinil. She has an [facility]  Chronic diastolic HF:  - Patient was at same weight as her last visit with Dr. BHaroldine Laws she did not appear volume overloaded she was continued on Demadex and spironolactone. She is  scheduled for repeat ECHO on 06/26.  Restrictive lung disease:  - continued home oxygen 4L   PAF:  - continued Xarelto 273mdaily   OSA:  - continued CPAP   ? Blood in stool:  Patient reported she may have had blood in her stool a few days ago however may have  been related to a meal of tomatoes,  Her vital signs were stable and her Hgb was at baseline.  No further workup inpatient was deemed necessary.  Please consider FOBT testing as outpatient.   Discharge Vitals:   BP 102/56  Pulse 75  Temp(Src) 97.9 F (36.6 C) (Oral)  Resp 19  Ht _0  (1.626 m)  Wt 231 lb 8 oz (105.008 kg)  BMI 39.72 kg/m2  SpO2 99%  Discharge Labs:  Results for orders placed during the hospital encounter of 06/25/13 (from the past 24 hour(s))  CBC     Status: Abnormal   Collection Time    06/25/13  1:44 AM      Result Value Ref Range   WBC 8.8  4.0 - 10.5 K/uL   RBC 3.76 (*) 3.87 - 5.11 MIL/uL   Hemoglobin 10.2 (*) 12.0 - 15.0 g/dL   HCT 31.6 (*) 36.0 - 46.0 %   MCV 84.0  78.0 - 100.0 fL   MCH 27.1  26.0 - 34.0 pg   MCHC 32.3  30.0 - 36.0 g/dL   RDW 15.9 (*) 11.5 - 15.5 %   Platelets 547 (*) 150 - 400 K/uL  BASIC METABOLIC PANEL     Status: Abnormal   Collection Time    06/25/13  1:44 AM      Result Value Ref Range   Sodium 138  137 - 147 mEq/L   Potassium 3.5 (*) 3.7 - 5.3 mEq/L   Chloride 98  96 - 112 mEq/L   CO2 26  19 - 32 mEq/L   Glucose, Bld 102 (*) 70 - 99 mg/dL   BUN 12  6 - 23 mg/dL   Creatinine, Ser 0.69  0.50 - 1.10 mg/dL   Calcium 9.1  8.4 - 10.5 mg/dL   GFR calc non Af Amer >90  >90 mL/min   GFR calc Af Amer >90  >90 mL/min  PRO B NATRIURETIC PEPTIDE     Status: Abnormal   Collection Time     06/25/13  1:44 AM      Result Value Ref Range   Pro B Natriuretic peptide (BNP) 149.0 (*) 0 - 125 pg/mL  I-STAT TROPOININ, ED     Status: None   Collection Time    06/25/13  2:12 AM      Result Value Ref Range   Troponin i, poc 0.01  0.00 - 0.08 ng/mL   Comment 3           GLUCOSE, CAPILLARY     Status: None   Collection Time    06/25/13  6:40 AM      Result Value Ref Range   Glucose-Capillary 89  70 - 99 mg/dL  TROPONIN I     Status: None   Collection Time    06/25/13  6:55 AM      Result Value Ref Range   Troponin I <0.30  <0.30 ng/mL  MAGNESIUM     Status: None   Collection Time    06/25/13  7:00 AM      Result Value Ref Range   Magnesium 2.1  1.5 - 2.5 mg/dL  CBC     Status: Abnormal   Collection Time    06/25/13  7:00 AM      Result Value Ref Range   WBC 8.4  4.0 - 10.5 K/uL   RBC 3.72 (*) 3.87 - 5.11 MIL/uL   Hemoglobin 10.1 (*) 12.0 - 15.0 g/dL   HCT 31.2 (*)  36.0 - 46.0 %   MCV 83.9  78.0 - 100.0 fL   MCH 27.2  26.0 - 34.0 pg   MCHC 32.4  30.0 - 36.0 g/dL   RDW 15.9 (*) 11.5 - 15.5 %   Platelets 522 (*) 150 - 400 K/uL  COMPREHENSIVE METABOLIC PANEL     Status: Abnormal   Collection Time    06/25/13  7:00 AM      Result Value Ref Range   Sodium 140  137 - 147 mEq/L   Potassium 3.6 (*) 3.7 - 5.3 mEq/L   Chloride 100  96 - 112 mEq/L   CO2 27  19 - 32 mEq/L   Glucose, Bld 99  70 - 99 mg/dL   BUN 11  6 - 23 mg/dL   Creatinine, Ser 0.71  0.50 - 1.10 mg/dL   Calcium 9.1  8.4 - 10.5 mg/dL   Total Protein 7.1  6.0 - 8.3 g/dL   Albumin 3.4 (*) 3.5 - 5.2 g/dL   AST 17  0 - 37 U/L   ALT 12  0 - 35 U/L   Alkaline Phosphatase 95  39 - 117 U/L   Total Bilirubin 0.2 (*) 0.3 - 1.2 mg/dL   GFR calc non Af Amer >90  >90 mL/min   GFR calc Af Amer >90  >90 mL/min  GLUCOSE, CAPILLARY     Status: None   Collection Time    06/25/13  7:31 AM      Result Value Ref Range   Glucose-Capillary 93  70 - 99 mg/dL  MRSA PCR SCREENING     Status: Abnormal   Collection Time     06/25/13  7:47 AM      Result Value Ref Range   MRSA by PCR POSITIVE (*) NEGATIVE  URINALYSIS, ROUTINE W REFLEX MICROSCOPIC     Status: Abnormal   Collection Time    06/25/13 11:02 AM      Result Value Ref Range   Color, Urine YELLOW  YELLOW   APPearance CLEAR  CLEAR   Specific Gravity, Urine 1.009  1.005 - 1.030   pH 8.0  5.0 - 8.0   Glucose, UA NEGATIVE  NEGATIVE mg/dL   Hgb urine dipstick NEGATIVE  NEGATIVE   Bilirubin Urine NEGATIVE  NEGATIVE   Ketones, ur NEGATIVE  NEGATIVE mg/dL   Protein, ur NEGATIVE  NEGATIVE mg/dL   Urobilinogen, UA 1.0  0.0 - 1.0 mg/dL   Nitrite NEGATIVE  NEGATIVE   Leukocytes, UA MODERATE (*) NEGATIVE  URINE MICROSCOPIC-ADD ON     Status: None   Collection Time    06/25/13 11:02 AM      Result Value Ref Range   Squamous Epithelial / LPF RARE  RARE   WBC, UA 3-6  <3 WBC/hpf   RBC / HPF 0-2  <3 RBC/hpf  GLUCOSE, CAPILLARY     Status: None   Collection Time    06/25/13 11:26 AM      Result Value Ref Range   Glucose-Capillary 90  70 - 99 mg/dL  TROPONIN I     Status: None   Collection Time    06/25/13 12:07 PM      Result Value Ref Range   Troponin I <0.30  <0.30 ng/mL    Signed: Joni Reining, DO 06/25/2013, 2:18 PM   Time Spent on Discharge: 35 minutes Services Ordered on Discharge: none Equipment Ordered on Discharge: none

## 2013-06-25 NOTE — Consult Note (Addendum)
Reason for Consult: chest pain Primary Cardiologist: Dr. Haroldine Laws Referring Physician: Dr. Margette Fast is an 53 y.o. female.  HPI: Ms. Stribling is a 53 yo woman with PMH of cognitive impairment, atrial flutter on xarelto, F7JO, diastolic HF, morbid obesity, OSA on CPAP, chronic 3-4L oxygen, chronic venous stasis, and severe pulmonary arterial hypertension on IV treprostinil since July 2014 and adcirca co-managed with [facility] team who presents with her family with a waxing/waning epigastric pain/discomfort and right shoulder/neck pain since 18:00 evening prior to admission. She actually was just seen by Dr. Haroldine Laws 6/16 and found to be euvolemic. She also notes a spasm in her right neck that is concerning to her.  She says she initially thought (and still thinks) her epigastric discomfort may be related to her GERD. She has a new pain between her neck and right shoulder that is concerning to her and she was worried about her Hickman catheter. She has several chronic areas of pain she relates to her remodulin more in her shoulder bones/arms so this is a new sensation. She is wearing her CPAP at home, no changes in exercise/functional status at home per patient or her son who is at bedside. She is not more dyspneic. She already is feeling improved in the ER.      Past Medical History  Diagnosis Date  . OSA (obstructive sleep apnea)     CPAP  . Venous stasis ulcer     chornic, ?followed up at wound care center, multiple courses of antibiotics in past for cellulitis, on lasix  . GERD (gastroesophageal reflux disease)   . Anemia, iron deficiency     secondary to menhorrhagia, on oral iron, also b12 def, getting monthly b12 shots  . Chronic cough     secondary to alleriges and post nasal drip  . Depression   . Diabetes mellitus     well controlled on metformin  . Hyperlipidemia   . H/O mental retardation   . Herpes   . Cor pulmonale     PA Peak pressure  87mHg  . Diastolic heart failure   . Restrictive lung disease     PFTs 06/2012 (FVC 54% predicted and FEV1 68% predicted w minimal bronchodilator response).  . CHF (congestive heart failure)   . Renal disorder   . Pulmonary hypertension   . Hypertension   . Shortness of breath   . COPD (chronic obstructive pulmonary disease)   . Cognitive impairment     Past Surgical History  Procedure Laterality Date  . Cholecystectomy      Family History  Problem Relation Age of Onset  . Mental illness Sister   . Mental retardation Brother     Social History:  reports that she has never smoked. She has never used smokeless tobacco. She reports that she does not drink alcohol or use illicit drugs.  Allergies:  Allergies  Allergen Reactions  . Aspirin     REACTION: airway swelling  . Codeine     REACTION: tingling in lips and hard breathing - had reaction at dentist - states "I can't take certain kinds of codeine" - happened maybe 10 yr ago  . Lisinopril     Cough  . Sulfonamide Derivatives     REACTION: airway swelling    Medications: I have reviewed the patient's current medications. Prior to Admission:  (Not in a hospital admission) Scheduled: Continuous:  Results for orders placed during the hospital encounter of 06/25/13 (from the past  48 hour(s))  CBC     Status: Abnormal   Collection Time    06/25/13  1:44 AM      Result Value Ref Range   WBC 8.8  4.0 - 10.5 K/uL   RBC 3.76 (*) 3.87 - 5.11 MIL/uL   Hemoglobin 10.2 (*) 12.0 - 15.0 g/dL   HCT 31.6 (*) 36.0 - 46.0 %   MCV 84.0  78.0 - 100.0 fL   MCH 27.1  26.0 - 34.0 pg   MCHC 32.3  30.0 - 36.0 g/dL   RDW 15.9 (*) 11.5 - 15.5 %   Platelets 547 (*) 150 - 400 K/uL  BASIC METABOLIC PANEL     Status: Abnormal   Collection Time    06/25/13  1:44 AM      Result Value Ref Range   Sodium 138  137 - 147 mEq/L   Potassium 3.5 (*) 3.7 - 5.3 mEq/L   Chloride 98  96 - 112 mEq/L   CO2 26  19 - 32 mEq/L   Glucose, Bld 102 (*)  70 - 99 mg/dL   BUN 12  6 - 23 mg/dL   Creatinine, Ser 0.69  0.50 - 1.10 mg/dL   Calcium 9.1  8.4 - 10.5 mg/dL   GFR calc non Af Amer >90  >90 mL/min   GFR calc Af Amer >90  >90 mL/min   Comment: (NOTE)     The eGFR has been calculated using the CKD EPI equation.     This calculation has not been validated in all clinical situations.     eGFR's persistently <90 mL/min signify possible Chronic Kidney     Disease.  PRO B NATRIURETIC PEPTIDE     Status: Abnormal   Collection Time    06/25/13  1:44 AM      Result Value Ref Range   Pro B Natriuretic peptide (BNP) 149.0 (*) 0 - 125 pg/mL  I-STAT TROPOININ, ED     Status: None   Collection Time    06/25/13  2:12 AM      Result Value Ref Range   Troponin i, poc 0.01  0.00 - 0.08 ng/mL   Comment 3            Comment: Due to the release kinetics of cTnI,     a negative result within the first hours     of the onset of symptoms does not rule out     myocardial infarction with certainty.     If myocardial infarction is still suspected,     repeat the test at appropriate intervals.    Dg Chest 2 View  06/25/2013   CLINICAL DATA:  Shoulder pain and shortness of breath.  EXAM: CHEST  2 VIEW  COMPARISON:  01/19/2013  FINDINGS: Right central line tip is in the upper margin of the right atrium. Mildly enlarged cardiopericardial silhouette. Thoracic spondylosis.  No pneumothorax. No clinically significant pleural effusion. No overt edema.  IMPRESSION: 1. Mild cardiomegaly, without edema. 2. Right IJ line tip: Upper right atrium.   Electronically Signed   By: Sherryl Barters M.D.   On: 06/25/2013 02:29    Review of Systems  Constitutional: Negative for fever and chills.  HENT: Negative for nosebleeds.   Eyes: Negative for double vision and photophobia.  Respiratory: Negative for hemoptysis and sputum production.   Cardiovascular: Positive for leg swelling. Negative for chest pain.  Gastrointestinal: Positive for heartburn and abdominal pain.  Negative for nausea, vomiting and melena.  Genitourinary: Negative for dysuria and hematuria.  Musculoskeletal: Positive for joint pain, myalgias and neck pain.  Skin: Negative for rash.  Neurological: Negative for tingling, tremors, focal weakness and headaches.  Endo/Heme/Allergies: Negative for polydipsia.  Psychiatric/Behavioral: Negative for depression and hallucinations.   Blood pressure 113/70, pulse 77, resp. rate 32, height _0  (1.626 m), weight 104.69 kg (230 lb 12.8 oz), SpO2 100.00%. Physical Exam  Nursing note and vitals reviewed. Constitutional: She appears well-developed and well-nourished. No distress.  HENT:  Head: Normocephalic and atraumatic.  Nose: Nose normal.  Mouth/Throat: Oropharynx is clear and moist. No oropharyngeal exudate.  Eyes: Conjunctivae and EOM are normal. Pupils are equal, round, and reactive to light. No scleral icterus.  Neck: Normal range of motion. Neck supple. No tracheal deviation present.  JVP 1-2 cm above clavicle  Cardiovascular: Normal rate, regular rhythm, normal heart sounds and intact distal pulses.  Exam reveals no gallop.   No murmur heard. Respiratory: Effort normal and breath sounds normal. No respiratory distress. She has no wheezes. She has no rales.  GI: Soft. Bowel sounds are normal. She exhibits no distension. There is no tenderness.  protuberant  Musculoskeletal: Normal range of motion. She exhibits edema. She exhibits no tenderness.  Neurological: She is alert.  Skin: She is not diaphoretic.   Labs reviewed; na 138, K 3.5, bun/cr 12/0.7, glucose 102, ProBNP 149, Trop 0.01, h/h 10.2/31.6, plt 547, wbc 8.8 Chest x-ray: no acute process, hickman in place right atrium superior junction ECG reviewed: SR, 1st degree AV block   ECHO 08/11/12 EF 60-65% RV moderately dilated, Peak PA pressure 48 mm Hg ECHO 02/28/13 EF 55-60% Grade I DD RV severely dilated and HK. Trivial TR. Septum flat Peak PA pressure 53 mmhg    Problem List 1.  Epigastric Discomfort/Right shoulder/neck Pain 2. 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 Atrial flutter on Xarelto 4. Chronic Diastolic HF/RV moderately Reduced EF and NYHA class II - on home SL 12.5 mg daily, 80 mg torsemide daily   Assessment/Plan: Ms. Anshu Wehner is a 53 yo woman with PMH of cognitive impairment, atrial flutter on xarelto, I4PP, diastolic HF, morbid obesity, OSA on CPAP, chronic 3-4L oxygen, chronic venous stasis, and severe pulmonary arterial hypertension on IV treprostinil since July 2014 and adcirca co-managed with [facility] team. Her symptoms sound musculoskeletal but differential diagnosis also includes s/e of remodulin, spasm, more typical features of chest pain/unstable angina, electrolyte disturbances, progression of pulmonary hypertension. There are no physical exam findings that reveal the cause of her shoulder/neck discomfort (also improved on exam). She has follow-up with [facility] July 1st and recently saw Dr. Haroldine Laws. For now, she will be admitted to Medicine and we will evaluate her chest pain with telemetry, trending cardiac markers and continue current [place] therapy.  - telemetry, trend cardiac biomarkers - reassess symptoms Continue treprostinil/remodulin at current dosing - she has enough until exchange this evening, which her son will help facilitate with supplies  - continue rivaroxaban 20 mg daily for atrial flutter - continue spironolactone 12.5 mg daily  - continue adcirca 20 mg daily - potential increase planned based on PH f/u - continue home dose torsemide 80 mg daily  - call with changes in status; thanks for assistance in caring for Ms. Gunderman - she has planned repeat Echocardiogram in Friday, 6/26   KELLY, [place] 06/25/2013, 4:41 AM   Initial  troponin negative. Doubt CP is cardiac in nature. Agree with above.    Stclair Szymborski,MD 11:30 AM

## 2013-06-25 NOTE — H&P (Signed)
Date: 06/25/2013               Patient Name:  [name] MRN: [id number]  DOB: [birth year] Age / Sex: 53 y.o., female   PCP: Faith Sox, MD         Medical Service: Internal Medicine Teaching Service         Attending Physician: Dr. Alfonzo Feller, DO    First Contact: [name] Pager: [phone]  Second Contact: [name] Pager: [phone]       After Hours (After 5p/  First Contact Pager: [phone]  weekends / holidays): Second Contact Pager: [phone]   Chief Complaint: neck/shoulder pain  History of Present Illness: [name] is a 53 year old woman with a PMH of chronic dHF (55-60%, grade 1 dd 02/2013, followed by [name]), severe pulmonary artery HTN (on IV treprostinil and tadalafil, followed by [facility]) with cor pulmonale, restrictive lung ds (on 4L home oxygen), PAF (on Xarelto), OSA on CPAP, hypokalemia and DM type 2.  She presents with right neck pain extending to the right upper chest catheter site.  The pain came on abruptly while she was laying in bed on the night of admission.  The pain was 8/10, sharp, intermittent and lasting 2-3 seconds.  She also notes dyspnea/chills/shaking.  She said she had belching, felt as if she was having heartburn/indigestion and felt tightness across her upper abdomen.  EMS was called.  She did not feel nauseous but had a sudden episode of vomiting while in the ambulance.  Still having intermittent pain in right neck/shoulder pain in the ED but breathing comfortably on home oxygen dose of 4L/min.  N/V have resolved.   In the ED:  T 98.54F, RR 24, SpO2 100% on 4L, HR 87, BP 116/22mHg; cardiology was consulted and IMTS was called for admission.   Meds: Current Facility-Administered Medications  Medication Dose Route Frequency [name] Last Rate Last Dose  . fentaNYL (SUBLIMAZE) injection 6.5 mcg  6.5 mcg Intravenous Q30 min PRN KAlfonzo Feller DO       Current Outpatient Prescriptions  Medication Sig Dispense Refill    . cyanocobalamin 500 MCG tablet Take 500 mcg by mouth daily.      .Marland Kitchengabapentin (NEURONTIN) 300 MG capsule Take 2 capsules (600 mg total) by mouth 3 (three) times daily.  180 capsule  5  . loperamide (IMODIUM) 2 MG capsule Take 2 mg by mouth daily as needed for diarrhea or loose stools.      .Marland Kitchenloratadine (CLARITIN) 10 MG tablet Take 10 mg by mouth daily.      . metFORMIN (GLUCOPHAGE) 1000 MG tablet Take 500 mg by mouth daily with breakfast.      . pantoprazole (PROTONIX) 20 MG tablet Take 20 mg by mouth 2 (two) times daily.      . potassium chloride SA (K-DUR,KLOR-CON) 20 MEQ tablet Take 40 mEq by mouth 3 (three) times daily. Take extra tab when take an extra Torsemide      . pravastatin (PRAVACHOL) 20 MG tablet Take 1 tablet (20 mg total) by mouth daily.  30 tablet  6  . Rivaroxaban (XARELTO) 20 MG TABS tablet Take 1 tablet (20 mg total) by mouth daily with supper.  30 tablet  6  . sodium chloride 0.9 % SOLN with treprostinil 1 MG/ML SOLN Inject 80 ng/kg/min into the vein continuous. Dose 80ng/kg/min based on dosing wt of 94kg. 466m24hours      . spironolactone (ALDACTONE)  25 MG tablet Take 0.5 tablets (12.5 mg total) by mouth daily.  90 tablet  3  . tadalafil (CIALIS) 20 MG tablet Take 20 mg by mouth 3 (three) times daily.      Marland Kitchen torsemide (DEMADEX) 20 MG tablet Take 80 mg by mouth daily. Take extra tab as needed for weight gain        Allergies: Allergies as of 06/25/2013 - Review Complete 06/25/2013  Allergen Reaction Noted  . Aspirin  12/09/2005  . Codeine    . Lisinopril  05/02/2012  . Sulfonamide derivatives  10/19/2005   Past Medical History  Diagnosis Date  . OSA (obstructive sleep apnea)     CPAP  . Venous stasis ulcer     chornic, ?followed up at wound care center, multiple courses of antibiotics in past for cellulitis, on lasix  . GERD (gastroesophageal reflux disease)   . Anemia, iron deficiency     secondary to menhorrhagia, on oral iron, also b12 def, getting monthly  b12 shots  . Chronic cough     secondary to alleriges and post nasal drip  . Depression   . Diabetes mellitus     well controlled on metformin  . Hyperlipidemia   . H/O mental retardation   . Herpes   . Cor pulmonale     PA Peak pressure 37mHg  . Diastolic heart failure   . Restrictive lung disease     PFTs 06/2012 (FVC 54% predicted and FEV1 68% predicted w minimal bronchodilator response).  . CHF (congestive heart failure)   . Renal disorder   . Pulmonary hypertension   . Hypertension   . Shortness of breath   . COPD (chronic obstructive pulmonary disease)   . Cognitive impairment    Past Surgical History  Procedure Laterality Date  . Cholecystectomy     Family History  Problem Relation Age of Onset  . Mental illness Sister   . Mental retardation Brother    History   Social History  . Marital Status: Single    Spouse Name: N/A    Number of Children: N/A  . Years of Education: N/A   Occupational History  . Not on file.   Social History Main Topics  . Smoking status: Never Smoker   . Smokeless tobacco: Never Used  . Alcohol Use: No  . Drug Use: No  . Sexual Activity: Not on file   Other Topics Concern  . Not on file   Social History Narrative  . No narrative on file    Review of Systems: Pertinent items are noted in HPI.  Physical Exam: Blood pressure 113/70, pulse 77, resp. rate 32, height _0  (1.626 m), weight 104.69 kg (230 lb 12.8 oz), SpO2 100.00%. General: resting in bed in NAD HEENT: PERRL, EOMI, oropharynx moist Cardiac: RRR, 3/6 systolic murmur, no rubs or gallops, no JVD, right upper chest catheter site non-erythematous Pulm: clear to auscultation bilaterally, moving normal volumes of air, no w/r/r Abd: soft, nontender, nondistended, BS present Ext: warm and well perfused, 1+ B/L LE edema MSK:  + supraclavicular fat pads B/L (TTP on right supraclavicular area) Neuro: alert and oriented X3, cranial nerves II-XII grossly intact, strength  and sensation equal and intact  Lab results: Basic Metabolic Panel:  Recent Labs  06/25/13 0144  NA 138  K 3.5*  CL 98  CO2 26  GLUCOSE 102*  BUN 12  CREATININE 0.69  CALCIUM 9.1   CBC:  Recent Labs  06/25/13 0144  WBC  8.8  HGB 10.2*  HCT 31.6*  MCV 84.0  PLT 547*   Cardiac Enzymes:  poc troponin negative  BNP:  Recent Labs  06/25/13 0144  PROBNP 149.0*   Imaging results:  Dg Chest 2 View  06/25/2013   CLINICAL DATA:  Shoulder pain and shortness of breath.  EXAM: CHEST  2 VIEW  COMPARISON:  01/19/2013  FINDINGS: Right central line tip is in the upper margin of the right atrium. Mildly enlarged cardiopericardial silhouette. Thoracic spondylosis.  No pneumothorax. No clinically significant pleural effusion. No overt edema.  IMPRESSION: 1. Mild cardiomegaly, without edema. 2. Right IJ line tip: Upper right atrium.   Electronically Signed   By: Sherryl Barters M.D.   On: 06/25/2013 02:29    Other results: EKG: HR 78bpm, sinus rhythm w/ 1st degree AV block, RAD  Assessment & Plan by Problem: 53 year old woman with a PMH of chronic dHF (55-60%, grade 1 dd 02/2013, followed by [name]), cor pulmale, severe pulmonary artery HTN (on IV treprostinil and tadalafil, followed by [facility]) and restrictive lung ds (on 4L home oxygen), PAF (on Xarelto), OSA on CPAP, hypokalemia and DM type 2.  Right neck/shoulder pain extending to the right upper chest (site of her catheter):  Atypical "chest" pain but given her hx need to rule out ACS.  She felt likel she had indigestion/heartburn and takes Protonix at home.  Differential includes GI (?dyspepsia/GERD) vs MSK pain vs injection site reaction (both myalgias and injection site rxn are possible ADRs of treprostinil).   She was noted to have myalgias at clinic visit last month (relieved by Tylenol) that were thought to be an ADR.  Cardiology consulted by EDP, has evaluated patient and recommendations appreciated.  - admit to IMTS -  monitor on telemetry - CE x 3 - AM EKG - follow-up Cardiology recommendations  Epigastric discomfort/vomiting:  Possible dyspepsia/GERD as she complained of belching and felt symptoms were related to indigestion.  She also notes increase in appetite and increased sweet tooth so perhaps she is eating more foods that could be causing GI upset. - continue protonix - consider GI cocktail if symptoms persist  Hypokalemia: - continue Kdur (check dose) and sprinolactone  PAH:   - continue treprostinil (she has enough to get through today, her son will bring in additional medication if she needs to stay an additional night) - she has an appointment to see Dr. Gilles Chiquito at [facility] on 44/92  Chronic diastolic HF:  Does not appear to be acute exacerbation.  No rales, pulmonary edema on CXR, no LE edema.  Recently seen by Dr. Haroldine Laws on 06/16 and noted to be euvolemic. - continue Demadex and spironolactone - daily weights and I&Os - cardiology consulted, courtesy call to Dr. Haroldine Laws in the AM - already scheduled for repeat ECHO on 06/26   Restrictive lung disease:  - continue home oxygen 4L  PAF:  NSR currently - telemetry  - continue Xarelto 40m daily  OSA: - continue CPAP  ? Blood in stool:  Patient says she is unsure if she had blood in her stool a few days ago.  She thinks the coloring may have been related to tomatoes she ate. -FOBT  Diet:  Heart healthy/carb mod VTE:  On Xarelto Code:  Full  Dispo: Disposition is deferred at this time, awaiting improvement of current medical problems. Anticipated discharge in approximately day(s).   The patient does have a current PCP (Faith Sox MD) and [facility].  The patient does not know have transportation limitations that hinder transportation to clinic appointments.  Signed: Duwaine Maxin, DO 06/25/2013, 4:16 AM

## 2013-06-26 ENCOUNTER — Encounter: Payer: Self-pay | Admitting: Internal Medicine

## 2013-06-26 NOTE — Discharge Summary (Signed)
Reviewed DC summary by Dr Heber Kamrar. Agree with documentation.

## 2013-06-29 ENCOUNTER — Other Ambulatory Visit (HOSPITAL_COMMUNITY): Payer: Self-pay | Admitting: Adult Health

## 2013-06-29 ENCOUNTER — Ambulatory Visit (HOSPITAL_COMMUNITY)
Admission: RE | Admit: 2013-06-29 | Discharge: 2013-06-29 | Disposition: A | Discharge status: Home / Self Care | Payer: Medicaid Other | Source: Ambulatory Visit | Attending: Internal Medicine | Admitting: Internal Medicine

## 2013-06-29 DIAGNOSIS — I059 Rheumatic mitral valve disease, unspecified: Secondary | ICD-10-CM | POA: Diagnosis not present

## 2013-06-29 DIAGNOSIS — I159 Secondary hypertension, unspecified: Secondary | ICD-10-CM

## 2013-06-29 DIAGNOSIS — I369 Nonrheumatic tricuspid valve disorder, unspecified: Secondary | ICD-10-CM

## 2013-06-29 DIAGNOSIS — I509 Heart failure, unspecified: Secondary | ICD-10-CM | POA: Insufficient documentation

## 2013-06-29 DIAGNOSIS — E669 Obesity, unspecified: Secondary | ICD-10-CM | POA: Insufficient documentation

## 2013-06-29 DIAGNOSIS — I079 Rheumatic tricuspid valve disease, unspecified: Secondary | ICD-10-CM | POA: Insufficient documentation

## 2013-06-29 DIAGNOSIS — R079 Chest pain, unspecified: Secondary | ICD-10-CM | POA: Diagnosis not present

## 2013-06-29 DIAGNOSIS — I379 Nonrheumatic pulmonary valve disorder, unspecified: Secondary | ICD-10-CM | POA: Diagnosis not present

## 2013-06-29 DIAGNOSIS — R0609 Other forms of dyspnea: Secondary | ICD-10-CM | POA: Diagnosis not present

## 2013-06-29 DIAGNOSIS — I270 Primary pulmonary hypertension: Secondary | ICD-10-CM | POA: Insufficient documentation

## 2013-06-29 DIAGNOSIS — R0989 Other specified symptoms and signs involving the circulatory and respiratory systems: Secondary | ICD-10-CM | POA: Insufficient documentation

## 2013-06-29 DIAGNOSIS — I272 Pulmonary hypertension, unspecified: Secondary | ICD-10-CM

## 2013-06-29 NOTE — Progress Notes (Signed)
  Echocardiogram 2D Echocardiogram has been performed.  [name], [name] 06/29/2013, 12:19 PM

## 2013-07-06 ENCOUNTER — Emergency Department (HOSPITAL_COMMUNITY): Payer: Medicaid Other

## 2013-07-06 ENCOUNTER — Encounter (HOSPITAL_COMMUNITY): Payer: Self-pay | Admitting: Emergency Medicine

## 2013-07-06 ENCOUNTER — Emergency Department (HOSPITAL_COMMUNITY)
Admission: EM | Admit: 2013-07-06 | Discharge: 2013-07-06 | Disposition: A | Discharge status: Home / Self Care | Payer: Medicaid Other | Attending: Emergency Medicine | Admitting: Emergency Medicine

## 2013-07-06 DIAGNOSIS — E781 Pure hyperglyceridemia: Secondary | ICD-10-CM | POA: Diagnosis not present

## 2013-07-06 DIAGNOSIS — J449 Chronic obstructive pulmonary disease, unspecified: Secondary | ICD-10-CM | POA: Insufficient documentation

## 2013-07-06 DIAGNOSIS — D509 Iron deficiency anemia, unspecified: Secondary | ICD-10-CM | POA: Insufficient documentation

## 2013-07-06 DIAGNOSIS — T82898A Other specified complication of vascular prosthetic devices, implants and grafts, initial encounter: Secondary | ICD-10-CM | POA: Insufficient documentation

## 2013-07-06 DIAGNOSIS — Z8669 Personal history of other diseases of the nervous system and sense organs: Secondary | ICD-10-CM | POA: Insufficient documentation

## 2013-07-06 DIAGNOSIS — Z7901 Long term (current) use of anticoagulants: Secondary | ICD-10-CM | POA: Diagnosis not present

## 2013-07-06 DIAGNOSIS — F3289 Other specified depressive episodes: Secondary | ICD-10-CM | POA: Diagnosis not present

## 2013-07-06 DIAGNOSIS — Z79899 Other long term (current) drug therapy: Secondary | ICD-10-CM | POA: Diagnosis not present

## 2013-07-06 DIAGNOSIS — F329 Major depressive disorder, single episode, unspecified: Secondary | ICD-10-CM | POA: Insufficient documentation

## 2013-07-06 DIAGNOSIS — K219 Gastro-esophageal reflux disease without esophagitis: Secondary | ICD-10-CM | POA: Insufficient documentation

## 2013-07-06 DIAGNOSIS — I509 Heart failure, unspecified: Secondary | ICD-10-CM | POA: Insufficient documentation

## 2013-07-06 DIAGNOSIS — I270 Primary pulmonary hypertension: Secondary | ICD-10-CM | POA: Diagnosis not present

## 2013-07-06 DIAGNOSIS — E119 Type 2 diabetes mellitus without complications: Secondary | ICD-10-CM | POA: Insufficient documentation

## 2013-07-06 DIAGNOSIS — N289 Disorder of kidney and ureter, unspecified: Secondary | ICD-10-CM | POA: Insufficient documentation

## 2013-07-06 DIAGNOSIS — G4733 Obstructive sleep apnea (adult) (pediatric): Secondary | ICD-10-CM | POA: Insufficient documentation

## 2013-07-06 DIAGNOSIS — Y840 Cardiac catheterization as the cause of abnormal reaction of the patient, or of later complication, without mention of misadventure at the time of the procedure: Secondary | ICD-10-CM | POA: Insufficient documentation

## 2013-07-06 DIAGNOSIS — J4489 Other specified chronic obstructive pulmonary disease: Secondary | ICD-10-CM | POA: Insufficient documentation

## 2013-07-06 DIAGNOSIS — T829XXA Unspecified complication of cardiac and vascular prosthetic device, implant and graft, initial encounter: Secondary | ICD-10-CM

## 2013-07-06 DIAGNOSIS — I998 Other disorder of circulatory system: Secondary | ICD-10-CM | POA: Diagnosis present

## 2013-07-06 MED ORDER — CHLORHEXIDINE GLUCONATE 4 % EX LIQD
CUTANEOUS | Status: AC
  Filled 2013-07-06: qty 15

## 2013-07-06 MED ORDER — CEFAZOLIN SODIUM-DEXTROSE 2-3 GM-% IV SOLR
2.0000 g | Freq: Once | INTRAVENOUS | Status: AC
  Administered 2013-07-06: 2 g via INTRAVENOUS
  Filled 2013-07-06: qty 50

## 2013-07-06 MED ORDER — DIPHENHYDRAMINE HCL 25 MG PO CAPS
25.0000 mg | ORAL_CAPSULE | Freq: Once | ORAL | Status: AC
  Administered 2013-07-06: 25 mg via ORAL
  Filled 2013-07-06: qty 1

## 2013-07-06 NOTE — Discharge Instructions (Signed)
Please followup closely with your doctor at [facility] for further management of your condition. Continue with your medication as previously prescribed.

## 2013-07-06 NOTE — ED Notes (Signed)
Informed pt of NPO status at this time. Pt reports without receiving medication, "I have about a four hour window before I start crashing." At this time, pt states she is about 2 hours into the window. Pt also reports use of home oxygen at 4L. Pt sats at 100% on room air; pt taking slow deep breaths; reports hx of anxiety. Placed pt on 4L oxygen via Time. Dr.Harrison and Gertie Fey, PA aware of situation

## 2013-07-06 NOTE — ED Notes (Addendum)
Per EMS, Pt was changing her central line dressing and accidentally cut her line, line currently clamped off. Pt reports blood thinners. NAD at this time. Pt reports diabetes. CBG:109. Central line placed at [facility]. Pt uses central line for Trepostinil.

## 2013-07-06 NOTE — ED Notes (Addendum)
[name] [phone]), patient's son, contacted to bring pt's emergency kit from home with medication.

## 2013-07-06 NOTE — ED Notes (Signed)
Per Gertie Fey, Dr.Henn, IR, to see patient in emergency room for central line issues

## 2013-07-06 NOTE — ED Notes (Signed)
Gertie Fey, PA aware pt has received antibiotic

## 2013-07-06 NOTE — ED Notes (Signed)
Iv team enroute to try to gain access for pt .

## 2013-07-06 NOTE — ED Notes (Signed)
Patient aware of plan of care; requesting food, informed patient of NPO status until after procedure

## 2013-07-06 NOTE — ED Provider Notes (Signed)
CSN: [id number]     Arrival date & time 07/06/13  1246 History   First MD Initiated Contact with Patient 07/06/13 1247     No chief complaint on file.    (Consider location/radiation/quality/duration/timing/severity/associated sxs/prior Treatment) HPI 53 year old female with a tunneled central line Hickman to the right side of neck for administration of IV Remodulin medicine is presents to the ER via EMS because she accidentally cut central line approximately 2 hours ago. Patient reports she has history of cor pulmonale hypertension with secondary congestive heart failure currently receiving Remodulin via IV continuously and has been receiving medication for the past 2 years. The central line was last changed 5 months ago.  Care received at [facility]. Patient states she was trying to cut off a piece of tape that she put over the central line when she accidentally cut it off.  Currently without complaint.  No fever, cp, sob, productive cough.  Her CHF doctor is Dr. Sung Amabile.        Past Medical History  Diagnosis Date  . OSA (obstructive sleep apnea)     CPAP  . Venous stasis ulcer     chornic, ?followed up at wound care center, multiple courses of antibiotics in past for cellulitis, on lasix  . GERD (gastroesophageal reflux disease)   . Anemia, iron deficiency     secondary to menhorrhagia, on oral iron, also b12 def, getting monthly b12 shots  . Chronic cough     secondary to alleriges and post nasal drip  . Depression   . Diabetes mellitus     well controlled on metformin  . Hyperlipidemia   . H/O mental retardation   . Herpes   . Cor pulmonale     PA Peak pressure 73mHg  . Diastolic heart failure   . Restrictive lung disease     PFTs 06/2012 (FVC 54% predicted and FEV1 68% predicted w minimal bronchodilator response).  . CHF (congestive heart failure)   . Renal disorder   . Pulmonary hypertension   . Hypertension   . Shortness of breath   . COPD (chronic obstructive  pulmonary disease)   . Cognitive impairment    Past Surgical History  Procedure Laterality Date  . Cholecystectomy     Family History  Problem Relation Age of Onset  . Mental illness Sister   . Mental retardation Brother    History  Substance Use Topics  . Smoking status: Never Smoker   . Smokeless tobacco: Never Used  . Alcohol Use: No   OB History   Grav Para Term Preterm Abortions TAB SAB Ect Mult Living   _0 Review of Systems  Constitutional: Negative for fever.  Respiratory: Negative for chest tightness and shortness of breath.   Cardiovascular: Negative for chest pain.  Gastrointestinal: Negative for abdominal pain.  Skin: Negative for rash and wound.  Neurological: Negative for numbness.      Allergies  Aspirin; Codeine; Lisinopril; and Sulfonamide derivatives  Home Medications   Prior to Admission medications   Medication Sig Start Date End Date Taking? Authorizing Provider  cyanocobalamin 500 MCG tablet Take 500 mcg by mouth daily.    Historical Provider, MD  gabapentin (NEURONTIN) 300 MG capsule Take 2 capsules (600 mg total) by mouth 3 (three) times daily. 03/20/13   ECorky Sox MD  loperamide (IMODIUM) 2 MG capsule Take 2 mg by mouth daily as needed for diarrhea or loose  stools.    Historical Provider, MD  loratadine (CLARITIN) 10 MG tablet Take 10 mg by mouth daily.    Historical Provider, MD  metFORMIN (GLUCOPHAGE) 1000 MG tablet Take 500 mg by mouth daily with breakfast. 05/17/13   Corky Sox, MD  pantoprazole (PROTONIX) 20 MG tablet Take 20 mg by mouth 2 (two) times daily.    Historical Provider, MD  potassium chloride SA (K-DUR,KLOR-CON) 20 MEQ tablet Take 40 mEq by mouth 3 (three) times daily. Take extra tab when take an extra Torsemide 11/23/12   Jolaine Artist, MD  pravastatin (PRAVACHOL) 20 MG tablet Take 1 tablet (20 mg total) by mouth daily. 09/01/12   Rande Brunt, NP  Rivaroxaban (XARELTO) 20 MG TABS tablet Take 1 tablet  (20 mg total) by mouth daily with supper. 02/13/13   [name] Bensimhon, MD  sodium chloride 0.9 % SOLN with treprostinil 1 MG/ML SOLN Inject 80 ng/kg/min into the vein continuous. Dose 80ng/kg/min based on dosing wt of 92kg. 56m/24hours    Historical Provider, MD  spironolactone (ALDACTONE) 25 MG tablet Take 0.5 tablets (12.5 mg total) by mouth daily. 02/13/13   DJolaine Artist MD  tadalafil (CIALIS) 20 MG tablet Take 20 mg by mouth 3 (three) times daily.    Historical Provider, MD  torsemide (DEMADEX) 20 MG tablet Take 80 mg by mouth daily. Take extra tab as needed for weight gain 10/03/12   ARande Brunt NP   There were no vitals taken for this visit. Physical Exam  Nursing note and vitals reviewed. Constitutional: She appears well-developed and well-nourished. No distress.  HENT:  Head: Atraumatic.  Eyes: Conjunctivae are normal.  Neck: Neck supple.  Cardiovascular: Normal rate and regular rhythm.   Pulmonary/Chest: Breath sounds normal. She has no rales.  Abdominal: Soft. There is no tenderness.  Neurological: She is alert.  Skin: No rash noted.  R upper anterior chest with Central Line in place, appears non-infected.  Line is cut approximately 14 inches from incision site.    Psychiatric: She has a normal mood and affect.    ED Course  Procedures (including critical care time)  1:21 PM Pt accidentally cut her Hickman central line.  Will consult IR for recommendation.  Care discussed with Dr. HAline Brochure    1:35 PM Several attempts were made to get in touch with our IR specialist, who are currently on vacation.  A message was left for Dr. HJeronimo Norma  Looking forward for a return call.  2:20 PM Pt currently c/o body itch.  She mentioned she would develop itch when her medication is not flowing.  Will give benadryl.   2:48 PM Pt report she can develop pulmonary complication if she is without her meds.  She has been without med for 2 hrs.  Will consult pulmonary specialist.  im  still unable to get a hold of IR.      3:02 PM I was able to consulted with PCCM Dr. MAnastasia Pallassistant who sts Dr. MLake Bellswould like to have remodulin transfuse through peripheral line and subsequently have IR to place Hickman catheter.    3:46 PM I was able to get in touch with radiologist Dr. HJeronimo Normawho will see pt in ER and will replace Hickman line.  Care discussed with pt who made aware of plan.   7:10 PM Patient has received her medication via peripheral line.  Dr. HJeronimo Normadid replace her Hickman, and then pt received abx, ancef. Pt now stable for discharge.  Labs Review Labs Reviewed - No data to display  Imaging Review Ir Fluoro Guide Cv Line Right  07/06/2013   INDICATION: Patient has a right chest Hickman catheter that was cut and needs to be replaced.  EXAM: EXCHANGE OF TUNNELED CENTRAL VENOUS CATHETER WITH FLUOROSCOPY  Physician: Stephan Minister. Anselm Pancoast, MD  FLUOROSCOPY TIME:  54 seconds  MEDICATIONS: None  ANESTHESIA/SEDATION: Moderate sedation time: None  PROCEDURE: The procedure was explained to the patient. The risks and benefits of the procedure were discussed and the patient's questions were addressed. Informed consent was obtained from the patient. The right chest and existing catheter were prepped and draped in sterile fashion. The skin around the catheter was anesthetized with 1% lidocaine. The catheter hub was completely missing. It was very difficult to remove the catheter with blunt dissection. Eventually, the catheter was pulled free but there was no evidence for a cuff. The catheter was removed over a stiff Glidewire. A 5 French catheter was placed for measurement purposes. A new single lumen Hickman catheter was advanced over the Glidewire and positioned in the lower SVC. Catheter aspirated and flushed well. Catheter was sutured to the skin.  FINDINGS: Catheter tip in the lower SVC.  COMPLICATIONS: None  IMPRESSION: Successful exchange of the right chest Hickman catheter with  fluoroscopic guidance.   Electronically Signed   By: Markus Daft M.D.   On: 07/06/2013 18:44     EKG Interpretation None      MDM   Final diagnoses:  Central line complication, initial encounter   BP 102/64  Pulse 81  Temp(Src) 97.7 F (36.5 C) (Oral)  Resp 20  Ht 5' 4" (1.626 m)  Wt 227 lb (102.967 kg)  BMI 38.95 kg/m2  SpO2 100%  I have reviewed nursing notes and vital signs. I personally reviewed the imaging tests through PACS system  I reviewed available ER/hospitalization records thought the EMR     Domenic Moras, [place] 07/06/13 1912

## 2013-07-06 NOTE — ED Notes (Addendum)
IV team at bedside; IV team aware of labs pending if able to obtain

## 2013-07-06 NOTE — ED Notes (Signed)
Pt reports accidentally cutting Hickman catheter line when attempting to change bandage this morning.  Pt receives trepostinil IV for pulmonary hypertension. Reports this is the 4th time she has cut the line when trying to change the dressing.  Line clamped, no bleeding noted. Pt complaining of itching, states usually itching starts when she hasn't had her medication.

## 2013-07-06 NOTE — Procedures (Signed)
Successful exchange of the right chest Hickman catheter.  New catheter tip in lower SVC.  Hickman is ready to use.  Will give antibiotics now that patient has two venous accesses.

## 2013-07-06 NOTE — ED Notes (Signed)
IR ready for patient

## 2013-07-06 NOTE — ED Notes (Signed)
Gertie Fey, PA aware pt is back to exam room. To inform PA when antibiotic is complete. Pt alert and oriented x 4; finished 100% of dinner tray. Pt denies any complaints at time this

## 2013-07-06 NOTE — ED Notes (Signed)
Dr Anselm Pancoast paged for bowie. [phone]. Have spoken to radiology and they advise he is is doing cases at another hospital and he will call back as soon as he is free

## 2013-07-06 NOTE — ED Notes (Addendum)
Pt attached home IV pump to R hand; Trepostinil infusing through IV site.  Pt reports all IV tubing is a new setup. Gertie Fey, PA aware of patient administering home medication

## 2013-07-06 NOTE — ED Notes (Signed)
Gertie Fey, [place] notified of patient's complaint of itching; Benadryl to be ordered

## 2013-07-06 NOTE — ED Notes (Signed)
Son at bedside.

## 2013-07-07 ENCOUNTER — Other Ambulatory Visit (HOSPITAL_COMMUNITY): Payer: Self-pay | Admitting: Internal Medicine

## 2013-07-07 DIAGNOSIS — I509 Heart failure, unspecified: Secondary | ICD-10-CM

## 2013-07-07 NOTE — ED Provider Notes (Signed)
Medical screening examination/treatment/procedure(s) were conducted as a shared visit with non-physician practitioner(s) and myself.  I personally evaluated the patient during the encounter.   EKG Interpretation None      I interviewed and examined the patient. Lungs are CTAB. Cardiac exam wnl. Abdomen soft. Pt appears well. Will contact IR for replacement of hickman. Pt in no acute distress.   Blanchard Kelch, MD 07/07/13 1209

## 2013-07-10 ENCOUNTER — Other Ambulatory Visit: Payer: Self-pay | Admitting: *Deleted

## 2013-07-13 ENCOUNTER — Other Ambulatory Visit: Payer: Self-pay | Admitting: *Deleted

## 2013-07-13 MED ORDER — PRAVASTATIN SODIUM 20 MG PO TABS: 20.0000 mg | ORAL_TABLET | Freq: Every day | ORAL | Status: DC

## 2013-07-15 MED ORDER — POTASSIUM CHLORIDE CRYS ER 20 MEQ PO TBCR: 40.0000 meq | EXTENDED_RELEASE_TABLET | Freq: Three times a day (TID) | ORAL | Status: DC

## 2013-07-15 MED ORDER — GABAPENTIN 300 MG PO CAPS: 600.0000 mg | ORAL_CAPSULE | Freq: Three times a day (TID) | ORAL | Status: DC

## 2013-08-01 ENCOUNTER — Other Ambulatory Visit: Payer: Self-pay | Admitting: Internal Medicine

## 2013-08-07 ENCOUNTER — Ambulatory Visit (INDEPENDENT_AMBULATORY_CARE_PROVIDER_SITE_OTHER): Payer: Medicaid Other | Admitting: Internal Medicine

## 2013-08-07 ENCOUNTER — Encounter: Payer: Self-pay | Admitting: Internal Medicine

## 2013-08-07 VITALS — BP 102/60 | HR 90 | Temp 97.1°F | Ht 63.5 in | Wt 235.1 lb

## 2013-08-07 DIAGNOSIS — I2789 Other specified pulmonary heart diseases: Secondary | ICD-10-CM

## 2013-08-07 DIAGNOSIS — M25512 Pain in left shoulder: Secondary | ICD-10-CM

## 2013-08-07 DIAGNOSIS — J302 Other seasonal allergic rhinitis: Secondary | ICD-10-CM

## 2013-08-07 DIAGNOSIS — M791 Myalgia, unspecified site: Secondary | ICD-10-CM

## 2013-08-07 DIAGNOSIS — I4892 Unspecified atrial flutter: Secondary | ICD-10-CM

## 2013-08-07 DIAGNOSIS — E1149 Type 2 diabetes mellitus with other diabetic neurological complication: Secondary | ICD-10-CM

## 2013-08-07 DIAGNOSIS — M25519 Pain in unspecified shoulder: Secondary | ICD-10-CM

## 2013-08-07 DIAGNOSIS — I272 Pulmonary hypertension, unspecified: Secondary | ICD-10-CM

## 2013-08-07 DIAGNOSIS — M25511 Pain in right shoulder: Secondary | ICD-10-CM

## 2013-08-07 DIAGNOSIS — IMO0001 Reserved for inherently not codable concepts without codable children: Secondary | ICD-10-CM

## 2013-08-07 DIAGNOSIS — J3089 Other allergic rhinitis: Secondary | ICD-10-CM

## 2013-08-07 LAB — GLUCOSE, CAPILLARY: GLUCOSE-CAPILLARY: 142 mg/dL — AB (ref 70–99)

## 2013-08-07 LAB — POCT GLYCOSYLATED HEMOGLOBIN (HGB A1C): Hemoglobin A1C: 5.6

## 2013-08-07 MED ORDER — FLUTICASONE PROPIONATE 50 MCG/ACT NA SUSP: 1.0000 | Freq: Every day | NASAL | Status: DC

## 2013-08-07 MED ORDER — RIVAROXABAN 20 MG PO TABS: 20.0000 mg | ORAL_TABLET | Freq: Every day | ORAL | Status: DC

## 2013-08-07 MED ORDER — GABAPENTIN 300 MG PO CAPS: 600.0000 mg | ORAL_CAPSULE | Freq: Three times a day (TID) | ORAL | Status: DC

## 2013-08-07 MED ORDER — CETIRIZINE HCL 10 MG PO TABS: 10.0000 mg | ORAL_TABLET | Freq: Every day | ORAL | Status: DC

## 2013-08-07 NOTE — Progress Notes (Signed)
Subjective:   [name]   DOB: Apr 29, 1960 53 y.o.   MRN: [id number]  HPI: [name] is a 53 y.o. 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 The patient was hospitalized in 06/2013 for neck and chest pain, thought to be related to her catheter. Since that time, the patient has had the catheter changed and now denies further chest or catheter site discomfort. She does continue to complain of arm and shoulder pain, previously thought to be 2/2 her Remodulin therapy, as UE pain is a very common adverse effect of this medication. She claims she continues to have myalgias in the arms, but also says she is now having worsened pain in her shoulder joints, causing her discomfort w/ sleeping, and walking with her walker.  Otherwise, the patient continues to be very compliant w/ her medications, including her lower dose of metformin (500 mg daily). She does claim that she has gained some weight recently since her last visit in the clinic, as her weight is 235 lbs, increased from 226 lbs in 05/2013. [name] does have a h/o CHF, but denies increased SOB, DOE, chest pain, increased LE edema, PND, or orthopnea.    Past Medical History  Diagnosis Date  . OSA (obstructive sleep apnea)     CPAP  . Venous stasis ulcer     chornic, ?followed up at wound care center, multiple courses of antibiotics in past for cellulitis, on lasix  . GERD (gastroesophageal reflux disease)   . Anemia, iron deficiency     secondary to menhorrhagia, on oral iron, also b12 def, getting monthly b12 shots  . Chronic cough     secondary to alleriges and post nasal drip  . Depression   . Diabetes mellitus     well controlled on metformin  . Hyperlipidemia   . H/O mental retardation   . Herpes   . Cor pulmonale     PA Peak pressure 2mHg  . Diastolic heart failure   .  Restrictive lung disease     PFTs 06/2012 (FVC 54% predicted and FEV1 68% predicted w minimal bronchodilator response).  . CHF (congestive heart failure)   . Renal disorder   . Pulmonary hypertension   . Hypertension   . Shortness of breath   . COPD (chronic obstructive pulmonary disease)   . Cognitive impairment    Current Outpatient Prescriptions  Medication Sig Dispense Refill  . acetaminophen (TYLENOL) 500 MG tablet Take 500 mg by mouth every 6 (six) hours as needed for mild pain, moderate pain, fever or headache.      . cyanocobalamin 500 MCG tablet Take 500 mcg by mouth daily.      . diphenhydrAMINE (BENADRYL) 25 mg capsule Take 25 mg by mouth every 6 (six) hours as needed for itching.      . Famotidine (PEPCID PO) Take 1 tablet by mouth daily as needed (acid, heartburn).      . gabapentin (NEURONTIN) 300 MG capsule Take 2 capsules (600 mg total) by mouth 3 (three) times daily.  180 capsule  5  . loperamide (IMODIUM) 2 MG capsule Take 2 mg by mouth daily as needed for diarrhea or loose stools.      .Marland Kitchenloratadine (CLARITIN) 10 MG tablet TAKE ONE TABLET BY MOUTH ONCE DAILY  30 tablet  1  . metFORMIN (GLUCOPHAGE) 1000 MG  tablet Take 500 mg by mouth daily with breakfast.      . pantoprazole (PROTONIX) 20 MG tablet Take 20 mg by mouth 2 (two) times daily.      . potassium chloride SA (K-DUR,KLOR-CON) 20 MEQ tablet Take 2 tablets (40 mEq total) by mouth 3 (three) times daily. Take extra tab when take an extra Torsemide  180 tablet  5  . pravastatin (PRAVACHOL) 20 MG tablet Take 1 tablet (20 mg total) by mouth daily.  30 tablet  6  . Rivaroxaban (XARELTO) 20 MG TABS tablet Take 1 tablet (20 mg total) by mouth daily with supper.  30 tablet  6  . sodium chloride 0.9 % SOLN with treprostinil 1 MG/ML SOLN Inject 80 ng/kg/min into the vein continuous. Dose 80ng/kg/min based on dosing wt of 92kg. 46m/24hours      . spironolactone (ALDACTONE) 25 MG tablet Take 0.5 tablets (12.5 mg total) by mouth  daily.  90 tablet  3  . tadalafil (CIALIS) 20 MG tablet Take 20 mg by mouth 3 (three) times daily.      .Marland Kitchentorsemide (DEMADEX) 20 MG tablet Take 80 mg by mouth daily. Take extra tab as needed for weight gain      . torsemide (DEMADEX) 20 MG tablet TAKE FOUR TABLETS BY MOUTH ONCE DAILY  135 tablet  6   No current facility-administered medications for this visit.   Family History  Problem Relation Age of Onset  . Mental illness Sister   . Mental retardation Brother    History   Social History  . Marital Status: Single    Spouse Name: N/A    Number of Children: N/A  . Years of Education: N/A   Social History Main Topics  . Smoking status: Never Smoker   . Smokeless tobacco: Never Used  . Alcohol Use: No  . Drug Use: No  . Sexual Activity: None   Other Topics Concern  . None   Social History Narrative  . None   Review of Systems: General: Denies fever, chills, diaphoresis, appetite change and fatigue.  Respiratory: Denies SOB, cough, chest tightness, and wheezing.   Cardiovascular: Denies chest pain, palpitations and leg swelling.  Gastrointestinal: Denies nausea, vomiting, abdominal pain, diarrhea, constipation, blood in stool and abdominal distention.  Genitourinary: Denies dysuria, urgency, frequency, hematuria, flank pain and difficulty urinating.  Endocrine: Denies hot or cold intolerance, sweats, polyuria, polydipsia. Musculoskeletal: Positive for UE myalgias and arthralgias. Denies back pain, joint swelling, and gait problem.  Skin: Positive for rash. Denies pallor and wounds.  Neurological: Denies dizziness, seizures, syncope, weakness, lightheadedness, numbness and headaches.  Psychiatric/Behavioral: Denies mood changes, confusion, nervousness, sleep disturbance and agitation.  Objective:  Physical Exam: Filed Vitals:   08/07/13 1517  BP: 102/60  Pulse: 90  Temp: 97.1 F (36.2 C)  TempSrc: Oral  Height: 5' 3.5" (1.613 m)  Weight: 235 lb 1.6 oz (106.641 kg)    SpO2: 98%   General: Vital signs reviewed.  Patient is a very pleasant obese female, in no acute distress and cooperative with exam.  Head: Normocephalic and atraumatic. Eyes: PERRL, EOMI, conjunctivae normal, No scleral icterus.  Neck: Supple, trachea midline, normal ROM, No JVD, masses, thyromegaly, or carotid bruit present.  Cardiovascular: RRR, S1 normal, S2 normal, no murmurs, gallops, or rubs. Pulmonary/Chest: Normal respiratory effort, CTAB, no wheezes, rales, or rhonchi. On 3L O2 via [place]. Abdominal: Soft, non-tender, non-distended, bowel sounds are normal, no masses, organomegaly, or guarding present.  Musculoskeletal: No joint deformities, erythema,  or stiffness, ROM full and no nontender. Extremities: Trace pitting edema in LE's, pulses symmetric and intact bilaterally. No cyanosis or clubbing. Neurological: A&O x3, Strength is normal and symmetric bilaterally, cranial nerve II-XII are grossly intact, no focal motor deficit, sensory intact to light touch bilaterally.  Skin: Warm, dry and intact. Rash present on UE's and chest; whitish macules, unchanged from previous exams.  Psychiatric: Normal mood and affect. Patient w/ mild mental retardation. Speech is pressured at times, behavior is normal.  Assessment & Plan:   Please see problem-based assessment and plan.

## 2013-08-07 NOTE — Patient Instructions (Signed)
General Instructions:  1. Please schedule a follow up appointment for 3 months.  2. Please take all medications as prescribed.   Start taking Zyrtec instead of Claritin for allergies.   Continue taking tylenol for muscle aches and pains.   See sports medicine for shoulder pain.   3. If you have worsening of your symptoms or new symptoms arise, please call the clinic ([phone]), or go to the ER immediately if symptoms are severe.  You have done a great job in taking all your medications. I appreciate it very much. Please continue doing that.   Thank you for bringing your medicines today. This helps [place] keep you safe from mistakes.   Progress Toward Treatment Goals:  Treatment Goal 08/07/2013  Hemoglobin A1C at goal    Self Care Goals & Plans:  Self Care Goal 08/07/2013  Manage my medications take my medicines as prescribed; bring my medications to every visit; refill my medications on time  Monitor my health keep track of my blood glucose; bring my glucose meter and log to each visit  Eat healthy foods drink diet soda or water instead of juice or soda; eat more vegetables; eat foods that are low in salt; eat baked foods instead of fried foods; eat fruit for snacks and desserts  Meeting treatment goals maintain the current self-care plan    Home Blood Glucose Monitoring 08/07/2013  Check my blood sugar no home glucose monitoring  When to check my blood sugar N/A     Care Management & Community Referrals:  Referral 08/07/2013  Referrals made for care management support none needed  Referrals made to community resources -

## 2013-08-09 ENCOUNTER — Encounter: Payer: Self-pay | Admitting: Internal Medicine

## 2013-08-09 NOTE — Assessment & Plan Note (Signed)
Patient w/ ongoing complaints of arm pain, most likely associated w/ remodulin therapy, however, she now claims her shoulder as hurting her as well. This has been an issue for her in the past, but says that Tylenol typically helps with the pain. On exam, ROM mildly limited by pain, especially w/ abduction >90 degrees. Only mild pain w/ palpation of the shoulder joints. Suspect this may also be related to Remoldulin, however, may also be associated w/ arthritic changes in the shoulder joint and rotator cuff inflammation.  -Continue Tylenol -Referral to sports medicine for further assessment and possible steroid injection if felt to be necessary.

## 2013-08-09 NOTE — Assessment & Plan Note (Signed)
Patient w/ a long h/o pulmonary HTN, follows w/ Dr. Gilles Chiquito at [facility], as well as Dr. Haroldine Laws. Recent ECHO from 06/29/13 shows EF of 64% and improved PA pressures from previous. No issues currently w/ Remodulin therapy except for myalgias in UE's as discussed below. Catheter site w/ no issues, changed in the ED 07/2013. Also now taking Cialis 20 mg tid, increased from 20 daily at her previous clinic visit.  -Continue torsemide, cialis, spironolactone, and remodulin.  -Patient to follow up w/ Dr. Gilles Chiquito in 01/2014.

## 2013-08-09 NOTE — Assessment & Plan Note (Signed)
Lab Results  Component Value Date   HGBA1C 5.6 08/07/2013   HGBA1C 5.4 05/16/2013   HGBA1C 5.3 09/29/2012     Assessment: Diabetes control: good control (HgbA1C at goal) Progress toward A1C goal:  at goal Comments: Continues to be well controlled as far as HbA1c. The patient has gained quite a bit of weight since her last visit (~9 lbs), most likely associated w/ decreased metformin dose. Discussed diet and exercise at length. She seems to do quite a bit of walking for her, and understood the she may need to modify her diet. Do not feel weight gain is related to heart failure at this time.   Plan: Medications:  continue current medications Home glucose monitoring: Frequency: no home glucose monitoring Timing: N/A Instruction/counseling given: discussed the need for weight loss Educational resources provided: brochure Self management tools provided:   Other plans: RTC in 3 months

## 2013-08-09 NOTE — Assessment & Plan Note (Signed)
[name] continues to have issues w/ her seasonal allergies, saying that Claritin is not working for her. Still w/ mild nasal congestion and rhinorrhea.  -Change Claritin to Zyrtec -Refill Flonase; has not had for some time.

## 2013-08-09 NOTE — Assessment & Plan Note (Signed)
Refilled Xarelto. Currently rate controlled.

## 2013-08-09 NOTE — Assessment & Plan Note (Signed)
Still w/ myalgias in proximal UE's. Continue to feel this is related to Remodulin. Claims her pain is well controlled w/ Tylenol. -Continue Tylenol prn for pain

## 2013-08-09 NOTE — Progress Notes (Signed)
Case discussed with Dr. Ronnald Ramp at the time of the visit.  We reviewed the resident's history and exam and pertinent patient test results.  I agree with the assessment, diagnosis, and plan of care documented in the resident's note.

## 2013-08-15 ENCOUNTER — Emergency Department (HOSPITAL_COMMUNITY)
Admission: EM | Admit: 2013-08-15 | Discharge: 2013-08-16 | Disposition: A | Discharge status: Home / Self Care | Payer: Medicaid Other | Attending: Emergency Medicine | Admitting: Emergency Medicine

## 2013-08-15 DIAGNOSIS — I5030 Unspecified diastolic (congestive) heart failure: Secondary | ICD-10-CM | POA: Diagnosis not present

## 2013-08-15 DIAGNOSIS — J4489 Other specified chronic obstructive pulmonary disease: Secondary | ICD-10-CM | POA: Insufficient documentation

## 2013-08-15 DIAGNOSIS — F329 Major depressive disorder, single episode, unspecified: Secondary | ICD-10-CM | POA: Diagnosis not present

## 2013-08-15 DIAGNOSIS — J449 Chronic obstructive pulmonary disease, unspecified: Secondary | ICD-10-CM | POA: Diagnosis not present

## 2013-08-15 DIAGNOSIS — K219 Gastro-esophageal reflux disease without esophagitis: Secondary | ICD-10-CM | POA: Insufficient documentation

## 2013-08-15 DIAGNOSIS — Z7901 Long term (current) use of anticoagulants: Secondary | ICD-10-CM | POA: Insufficient documentation

## 2013-08-15 DIAGNOSIS — Z8742 Personal history of other diseases of the female genital tract: Secondary | ICD-10-CM | POA: Diagnosis not present

## 2013-08-15 DIAGNOSIS — K029 Dental caries, unspecified: Secondary | ICD-10-CM | POA: Insufficient documentation

## 2013-08-15 DIAGNOSIS — Z8619 Personal history of other infectious and parasitic diseases: Secondary | ICD-10-CM | POA: Diagnosis not present

## 2013-08-15 DIAGNOSIS — K0889 Other specified disorders of teeth and supporting structures: Secondary | ICD-10-CM | POA: Insufficient documentation

## 2013-08-15 DIAGNOSIS — Z79899 Other long term (current) drug therapy: Secondary | ICD-10-CM | POA: Insufficient documentation

## 2013-08-15 DIAGNOSIS — Z862 Personal history of diseases of the blood and blood-forming organs and certain disorders involving the immune mechanism: Secondary | ICD-10-CM | POA: Diagnosis not present

## 2013-08-15 DIAGNOSIS — F3289 Other specified depressive episodes: Secondary | ICD-10-CM | POA: Diagnosis not present

## 2013-08-15 DIAGNOSIS — I10 Essential (primary) hypertension: Secondary | ICD-10-CM | POA: Insufficient documentation

## 2013-08-15 DIAGNOSIS — Z792 Long term (current) use of antibiotics: Secondary | ICD-10-CM | POA: Diagnosis not present

## 2013-08-15 DIAGNOSIS — E785 Hyperlipidemia, unspecified: Secondary | ICD-10-CM | POA: Diagnosis not present

## 2013-08-15 DIAGNOSIS — IMO0002 Reserved for concepts with insufficient information to code with codable children: Secondary | ICD-10-CM | POA: Diagnosis not present

## 2013-08-15 DIAGNOSIS — E119 Type 2 diabetes mellitus without complications: Secondary | ICD-10-CM | POA: Insufficient documentation

## 2013-08-16 ENCOUNTER — Encounter (HOSPITAL_COMMUNITY): Payer: Self-pay | Admitting: Emergency Medicine

## 2013-08-16 MED ORDER — ACETAMINOPHEN 500 MG PO TABS: 500.0000 mg | ORAL_TABLET | Freq: Four times a day (QID) | ORAL | Status: DC | PRN

## 2013-08-16 MED ORDER — PENICILLIN V POTASSIUM 500 MG PO TABS: 500.0000 mg | ORAL_TABLET | Freq: Four times a day (QID) | ORAL | Status: AC

## 2013-08-16 NOTE — ED Notes (Signed)
Pt reports loose upper front tooth and pain.

## 2013-08-16 NOTE — Discharge Instructions (Signed)
Dental Care and Dentist Visits Dental care supports good overall health. Regular dental visits can also help you avoid dental pain, bleeding, infection, and other more serious health problems in the future. It is important to keep the mouth healthy because diseases in the teeth, gums, and other oral tissues can spread to other areas of the body. Some problems, such as diabetes, heart disease, and pre-term labor have been associated with poor oral health.  See your dentist every 6 months. If you experience emergency problems such as a toothache or broken tooth, go to the dentist right away. If you see your dentist regularly, you may catch problems early. It is easier to be treated for problems in the early stages.  WHAT TO EXPECT AT A DENTIST VISIT  Your dentist will look for many common oral health problems and recommend proper treatment. At your regular dental visit, you can expect:  Gentle cleaning of the teeth and gums. This includes scraping and polishing. This helps to remove the sticky substance around the teeth and gums (plaque). Plaque forms in the mouth shortly after eating. Over time, plaque hardens on the teeth as tartar. If tartar is not removed regularly, it can cause problems. Cleaning also helps remove stains.  Periodic X-rays. These pictures of the teeth and supporting bone will help your dentist assess the health of your teeth.  Periodic fluoride treatments. Fluoride is a natural mineral shown to help strengthen teeth. Fluoride treatmentinvolves applying a fluoride gel or varnish to the teeth. It is most commonly done in children.  Examination of the mouth, tongue, jaws, teeth, and gums to look for any oral health problems, such as:  Cavities (dental caries). This is decay on the tooth caused by plaque, sugar, and acid in the mouth. It is best to catch a cavity when it is small.  Inflammation of the gums caused by plaque buildup (gingivitis).  Problems with the mouth or malformed  or misaligned teeth.  Oral cancer or other diseases of the soft tissues or jaws. KEEP YOUR TEETH AND GUMS HEALTHY For healthy teeth and gums, follow these general guidelines as well as your dentist's specific advice:  Have your teeth professionally cleaned at the dentist every 6 months.  Brush twice daily with a fluoride toothpaste.  Floss your teeth daily.  Ask your dentist if you need fluoride supplements, treatments, or fluoride toothpaste.  Eat a healthy diet. Reduce foods and drinks with added sugar.  Avoid smoking. TREATMENT FOR ORAL HEALTH PROBLEMS If you have oral health problems, treatment varies depending on the conditions present in your teeth and gums.  Your caregiver will most likely recommend good oral hygiene at each visit.  For cavities, gingivitis, or other oral health disease, your caregiver will perform a procedure to treat the problem. This is typically done at a separate appointment. Sometimes your caregiver will refer you to another dental specialist for specific tooth problems or for surgery. SEEK IMMEDIATE DENTAL CARE IF:  You have pain, bleeding, or soreness in the gum, tooth, jaw, or mouth area.  A permanent tooth becomes loose or separated from the gum socket.  You experience a blow or injury to the mouth or jaw area. Document Released: 09/02/2010 Document Revised: 03/15/2011 Document Reviewed: 09/02/2010 [facility] Patient Information 2015 [place], [place]. This information is not intended to replace advice given to you by your health care provider. Make sure you discuss any questions you have with your health care provider.  Emergency Department Resource Guide 1) Find a Doctor  and Pay Out of Pocket Although you won't have to find out who is covered by your insurance plan, it is a good idea to ask around and get recommendations. You will then need to call the office and see if the doctor you have chosen will accept you as a new patient and what types of  options they offer for patients who are self-pay. Some doctors offer discounts or will set up payment plans for their patients who do not have insurance, but you will need to ask so you aren't surprised when you get to your appointment.  2) Contact Your Local Health Department Not all health departments have doctors that can see patients for sick visits, but many do, so it is worth a call to see if yours does. If you don't know where your local health department is, you can check in your phone book. The CDC also has a tool to help you locate your state's health department, and many state websites also have listings of all of their local health departments.  3) Find a [facility] If your illness is not likely to be very severe or complicated, you may want to try a walk in clinic. These are popping up all over the country in pharmacies, drugstores, and shopping centers. They're usually staffed by nurse practitioners or physician assistants that have been trained to treat common illnesses and complaints. They're usually fairly quick and inexpensive. However, if you have serious medical issues or chronic medical problems, these are probably not your best option.  No Primary Care Doctor: - Call Health Connect at  [phone] - they can help you locate a primary care doctor that  accepts your insurance, provides certain services, etc. - Physician Referral Service- [phone]  Chronic Pain Problems: Organization         Address  Phone   Notes  [facility]  [phone] Patients need to be referred by their primary care doctor.   Medication Assistance: Organization         Address  Phone   Notes  [facility] Medication [facility] [address] [place], [place] [address] [phone] --Must be a resident of [facility] -- Must have NO insurance coverage whatsoever (no Medicaid/ Medicare, etc.) -- The pt. MUST have a primary care doctor that directs  their care regularly and follows them in the community   Med[facility]ist  [phone]   [employer]  [phone]    Agencies that provide inexpensive medical care: Organization         Address  Phone   Notes  [place]  [phone]   [facility]    [phone]   [facility] [place], [place] [address] [phone]   [place] [address], [place] [phone]   Planned Parenthood    [phone]   [facility]    [phone]   [place] and [place] [address], [place] Phone:  [phone], Fax:  [phone] Hours of Operation:  9 am - 6 pm, M-F.  Also accepts Medicaid/Medicare and self-pay.  [facility] [place] [address], [address], [place] Phone: [phone], Fax: [phone]. Hours of Operation:  8:30 am - 5:30 pm, M-F.  Also accepts Medicaid and self-pay.  [facility] [address] [employer] Phone: [phone]  [facility] [place], [place], [place] [phone], Ext. [phone] Mondays & Thursdays: 7-9 AM.  First 15 patients are seen on a first come, first serve basis.    [place] Providers:  Organization         Address  Phone   Notes  [facility] [address], [place] [phone] Also accepts self-pay patients.  [facility] [address], [place]  [phone]   [place], [address], [place] [phone]   [facility] [address], [place] [phone]   Lucianne Lei [address], [place]   [phone] Only accepts [place] Access [place] patients after they have their name applied to their card.   Self-Pay (no insurance) in [facility]:  Organization          Address  Phone   Notes  Sickle Cell Patients, [facility] [place] [phone]   [facility] [place] [phone]   [facility] [place]  [place], [place], [place] [phone]   [facility]/Dr. Osei-Bonsu  [address] [place] or [address], [place] [phone] Phone number for both [place] and [place] locations is the same.  [facility] [address], [place] [phone]   [facility] [address] [place] or [address] [phone]   [facility] [address], [place] [phone], phone; [phone], fax Sees patients 1st and 3rd Saturday of every month.  Must not qualify for public or private insurance (i.e. Medicaid, Medicare, [place] Health Choice, Veterans' Benefits)  Household income should be no more than 200% of the poverty level The clinic cannot treat you if you are pregnant or think you are pregnant  Sexually transmitted diseases are not treated at the clinic.    Dental Care: Organization         Address  Phone  Notes  [facility] Department [facility] [place] [phone] Accepts children up to age 36 who are enrolled in [place] or [place]; pregnant women with a Medicaid card; and children who have applied for Medicaid or [place] Health Choice, but were declined, whose parents can pay a reduced fee at time of service.  [facility] of [facility]  [address], [place] [phone] Accepts children up to age 57 who are enrolled in [place] or [place]; pregnant women with a Medicaid card; and children who have applied for Medicaid or [place] Health Choice, but were declined, whose parents can pay a reduced fee at time of  service.  [place] Adult [facility] Access PROGRAM  [place] [phone] Patients are seen by appointment only. Walk-ins are not accepted. [place] will see patients 37 years of age and older. Monday - Tuesday (8am-5pm) Most Wednesdays (8:30-5pm) $30 per visit, cash only  [facility] Adult Dental Access PROGRAM  [address], [facility] [phone] Patients are seen by appointment only. Walk-ins are not accepted. [place] will see patients 70 years of age and older. One Wednesday Evening (Monthly: Volunteer Based).  $30 per visit,  cash only  [place]  [phone] for adults; Children under age 2, call Graduate Pediatric Dentistry at [phone]. Children aged 66-14, please call [phone] to request a pediatric application.  Dental services are provided in all areas of dental care including fillings, crowns and bridges, complete and partial dentures, implants, gum treatment, root canals, and extractions. Preventive care is also provided. Treatment is provided to both adults and children. Patients are selected via a lottery and there is often a waiting list.   [facility] [address] [place]  [phone] [email or web address]   [facility] [address] [place], [place] [phone], Ext. [phone] Second and Fourth Thursday of each month, opens at 6:30 AM; Clinic ends at 9 AM.  Patients are seen on a first-come first-served basis, and a limited number are seen during each clinic.   [facility]  [address] Hillard Danker [place], [place] [phone]   Eligibility Requirements You must have lived in [place], [place], or [place] counties for at least the last three months.   You cannot be eligible for state or federal sponsored [employer], including [employer], [place], or [employer].   You generally cannot be eligible for healthcare insurance through your employer.     How to apply: Eligibility screenings are held every Tuesday and Wednesday afternoon from 1:00 pm until 4:00 pm. You do not need an appointment for the interview!  [facility] [address], [place], [place]   [place]  [place] Department  [place]  [phone]    Behavioral Health Resources in the Community: Intensive Outpatient Programs Organization         Address  Phone  Notes  [place] Odin. [address] [place], [place] [phone]   [facility] Outpatient [address] [place], [place]   ADS: Alcohol & Drug Svcs [address] [place], [place]   [place] [address]  [place], [place] or [phone]   Substance Abuse Resources Organization         Address  Phone  Notes  Alcohol and Drug Services  [phone]   [place]  [phone]   [facility] [place]   Chinita Pester  [phone]   Residential & Outpatient Substance Abuse Program  [phone]   Psychological Services Organization         Address  Phone  Notes  [facility]  [place]  [phone]   [place] [address] [place] or [phone]    Mobile Crisis Teams Organization         Address  Phone  Notes  [facility], Mobile Crisis Care Unit  [phone]   [facility]  [address] [place], [place]   [place] Levels [address] [place] [phone]    Self-Help/Support Groups Organization         Address  Phone             Notes  [place]. of [place] - variety of support groups  [place] Call for more information  Narcotics Anonymous (NA), [facility] [address]  Dr, [employer] Baker  2 meetings at this location   Special educational needs teacher  Address  Phone  Notes  [facility] [address],    [place]  [phone]   [facility]  [address] [place] [address], [place], [place]   [place], [place] [phone] Admissions: 8am-3pm M-F  Incentives Substance [place] [address]    [place], [place] [phone]   [facility] [address] [place], [place], [place]   [facility] [address]  [place], [place]   [facility] - Intensive Outpatient [address], [place], [place]   [facility] ([place].,  [place], [place] [phone] or [phone]   [facility] (RTS) [address], [place], [place] Accepts Medicaid  [facility] [place] [address]  [place] [phone] Substance Abuse/Addiction Treatment   [facility] Organization         Address  Phone  Notes  [facility]  [phone]   Domenic Schwab, PhD [address] Arlis Porta [place], [place]   [phone] or [phone]   [place], [place] [phone]   [facility] [address] [place], [place] [phone] Insurance/Medicaid/sponsorship through [facility] and Families [address], [address] [place]                                    [place], [place] [phone] [place] [address], [place] [phone]    Dr. Adele Schilder  [phone]   [facility] Dept. 1) [address] [place] 2) [place] 3)  [address], [place] [phone]  [phone]   [place] [phone] or [phone] (After Hours)

## 2013-08-16 NOTE — ED Notes (Signed)
Bed: [facility] Expected date:  Expected time:  Means of arrival:  Comments:

## 2013-08-20 NOTE — ED Provider Notes (Signed)
CSN: [id number]     Arrival date & time 08/15/13  2257 History   First MD Initiated Contact with Patient 08/16/13 0136     No chief complaint on file.    (Consider location/radiation/quality/duration/timing/severity/associated sxs/prior Treatment) HPI Comments: Patient is a 53 year old female who presents to the emergency department for a loose upper tooth. Patient states that yesterday she was sucking on an ice cube when she noticed that her tooth became loose. Patient has had some pain associated with this tooth since it became loose. Pain is constant and sharp. She states that it is worse when she tries to chew things. She has not taken anything to try and alleviate the pain. Patient denies following up with a dentist for this complaint or contacting a dentist. She further denies purulent drainage from the mouth, fever, inability to swallow, drooling, direct trauma or injury to her mouth, or complete avulsion of her tooth.  The history is provided by the patient. No language interpreter was used.    Past Medical History  Diagnosis Date  . OSA (obstructive sleep apnea)     CPAP  . Venous stasis ulcer     chornic, ?followed up at wound care center, multiple courses of antibiotics in past for cellulitis, on lasix  . GERD (gastroesophageal reflux disease)   . Anemia, iron deficiency     secondary to menhorrhagia, on oral iron, also b12 def, getting monthly b12 shots  . Chronic cough     secondary to alleriges and post nasal drip  . Depression   . Diabetes mellitus     well controlled on metformin  . Hyperlipidemia   . H/O mental retardation   . Herpes   . Cor pulmonale     PA Peak pressure 34mHg  . Diastolic heart failure   . Restrictive lung disease     PFTs 06/2012 (FVC 54% predicted and FEV1 68% predicted w minimal bronchodilator response).  . CHF (congestive heart failure)   . Renal disorder   . Pulmonary hypertension   . Hypertension   . Shortness of breath   . COPD  (chronic obstructive pulmonary disease)   . Cognitive impairment    Past Surgical History  Procedure Laterality Date  . Cholecystectomy     Family History  Problem Relation Age of Onset  . Mental illness Sister   . Mental retardation Brother    History  Substance Use Topics  . Smoking status: Never Smoker   . Smokeless tobacco: Never Used  . Alcohol Use: No   OB History   Grav Para Term Preterm Abortions TAB SAB Ect Mult Living   _0 Review of Systems  HENT: Positive for dental problem.   All other systems reviewed and are negative.    Allergies  Aspirin; Codeine; Lisinopril; and Sulfonamide derivatives  Home Medications   Prior to Admission medications   Medication Sig Start Date End Date Taking? Authorizing Provider  acetaminophen (TYLENOL) 500 MG tablet Take 1 tablet (500 mg total) by mouth every 6 (six) hours as needed for mild pain, moderate pain, fever or headache. 08/16/13   KAntonietta Breach PA-C  cetirizine (ZYRTEC ALLERGY) 10 MG tablet Take 1 tablet (10 mg total) by mouth daily. 08/07/13   ECorky Sox MD  cyanocobalamin 500 MCG tablet Take 500 mcg by mouth daily.    Historical Provider, MD  diphenhydrAMINE (BENADRYL) 25 mg capsule Take 25 mg by mouth  every 6 (six) hours as needed for itching.    Historical Provider, MD  Famotidine (PEPCID PO) Take 1 tablet by mouth daily as needed (acid, heartburn).    Historical Provider, MD  fluticasone (FLONASE) 50 MCG/ACT nasal spray Place 1 spray into both nostrils daily. 08/07/13 08/07/14  Corky Sox, MD  gabapentin (NEURONTIN) 300 MG capsule Take 2 capsules (600 mg total) by mouth 3 (three) times daily. 08/07/13   Corky Sox, MD  loperamide (IMODIUM) 2 MG capsule Take 2 mg by mouth daily as needed for diarrhea or loose stools.    Historical Provider, MD  metFORMIN (GLUCOPHAGE) 1000 MG tablet Take 500 mg by mouth daily with breakfast. 05/17/13   Corky Sox, MD  pantoprazole (PROTONIX) 20 MG tablet Take 20 mg by  mouth 2 (two) times daily.    Historical Provider, MD  penicillin v potassium (VEETID) 500 MG tablet Take 1 tablet (500 mg total) by mouth 4 (four) times daily. 08/16/13 08/23/13  Antonietta Breach, PA-C  potassium chloride SA (K-DUR,KLOR-CON) 20 MEQ tablet Take 2 tablets (40 mEq total) by mouth 3 (three) times daily. Take extra tab when take an extra [place], MD  pravastatin (PRAVACHOL) 20 MG tablet Take 1 tablet (20 mg total) by mouth daily.    Corky Sox, MD  rivaroxaban (XARELTO) 20 MG TABS tablet Take 1 tablet (20 mg total) by mouth daily with supper. 08/07/13   Corky Sox, MD  sodium chloride 0.9 % SOLN with treprostinil 1 MG/ML SOLN Inject 80 ng/kg/min into the vein continuous. Dose 80ng/kg/min based on dosing wt of 92kg. 25m/24hours    Historical Provider, MD  spironolactone (ALDACTONE) 25 MG tablet Take 0.5 tablets (12.5 mg total) by mouth daily. 02/13/13   DJolaine Artist MD  tadalafil (CIALIS) 20 MG tablet Take 20 mg by mouth 3 (three) times daily.    Historical Provider, MD  torsemide (DEMADEX) 20 MG tablet Take 80 mg by mouth daily. Take extra tab as needed for weight gain 10/03/12   ARande Brunt NP   BP 107/78  Pulse 87  Temp(Src) 98.1 F (36.7 C) (Oral)  Resp 18  SpO2 98%  Physical Exam  Nursing note and vitals reviewed. Constitutional: She is oriented to person, place, and time. She appears well-developed and well-nourished. No distress.  Nontoxic/nonseptic appearing  HENT:  Head: Normocephalic and atraumatic.  Mouth/Throat: Uvula is midline, oropharynx is clear and moist and mucous membranes are normal. Abnormal dentition. Dental caries present. No dental abscesses or uvula swelling.    Dental caries with loose R upper dentition with associated TTP. No gingival swelling or fluctuance. No oral bleeding. Patient tolerating secretions without difficulty. Oropharynx clear.  Eyes: Conjunctivae and EOM are normal. Pupils are equal, round, and reactive to light.  No scleral icterus.  Neck: Normal range of motion.  Pulmonary/Chest: Effort normal. No respiratory distress.  Chest expansion symmetrical. No tachypnea or dyspnea.  Musculoskeletal: Normal range of motion.  Neurological: She is alert and oriented to person, place, and time. She exhibits normal muscle tone. Coordination normal.  GCS 15. Patient moves extremities without ataxia.  Skin: Skin is warm and dry. No rash noted. She is not diaphoretic. No erythema. No pallor.  Psychiatric: She has a normal mood and affect. Her behavior is normal.    ED Course  Procedures (including critical care time) Labs Review Labs Reviewed - No data to display  Imaging Review No results found.  EKG Interpretation None      MDM   Final diagnoses:  Loosening of tooth  Dentalgia  Dental caries    Patient with dentalgia and loosening of R upper dentition. No complete tooth avulsion. No gross abscess. Exam unconcerning for Ludwig's angina or spread of infection. Will treat with penicillin and tylenol for pain. Urged patient to follow-up with dentist. Referral and resource guide provided; no oral surgeon on call for oral surgery referral. Return precautions discussed and provided. Patient agreeable to plan with no unaddressed concerns.   Filed Vitals:   08/16/13 0021 08/16/13 0255  BP: 107/78   Pulse: 88 87  Temp: 98.1 F (36.7 C)   TempSrc: Oral   Resp: 18   SpO2: 98% 98%     Antonietta Breach, PA-C 08/20/13 1936

## 2013-08-21 NOTE — ED Provider Notes (Signed)
Medical screening examination/treatment/procedure(s) were performed by non-physician practitioner and as supervising physician I was immediately available for consultation/collaboration.   EKG Interpretation None       Varney Biles, MD 08/21/13 [phone]

## 2013-09-17 ENCOUNTER — Encounter: Payer: Self-pay | Admitting: *Deleted

## 2013-10-01 ENCOUNTER — Ambulatory Visit: Payer: Medicaid Other | Admitting: Sports Medicine

## 2013-10-06 ENCOUNTER — Other Ambulatory Visit: Payer: Self-pay | Admitting: Internal Medicine

## 2013-10-17 NOTE — Addendum Note (Signed)
Addended by: Hulan Fray on: 10/17/2013 07:49 PM   Modules accepted: Orders

## 2013-11-05 ENCOUNTER — Encounter (HOSPITAL_COMMUNITY): Payer: Self-pay | Admitting: Emergency Medicine

## 2013-11-05 ENCOUNTER — Ambulatory Visit (HOSPITAL_COMMUNITY)
Admission: RE | Admit: 2013-11-05 | Discharge: 2013-11-05 | Disposition: A | Discharge status: Home / Self Care | Payer: Medicaid Other | Source: Ambulatory Visit | Attending: Internal Medicine | Admitting: Internal Medicine

## 2013-11-05 ENCOUNTER — Other Ambulatory Visit (HOSPITAL_COMMUNITY): Payer: Self-pay | Admitting: Cardiology

## 2013-11-05 VITALS — BP 92/60 | HR 99 | Wt 236.5 lb

## 2013-11-05 DIAGNOSIS — I270 Primary pulmonary hypertension: Secondary | ICD-10-CM

## 2013-11-05 DIAGNOSIS — K30 Functional dyspepsia: Secondary | ICD-10-CM | POA: Insufficient documentation

## 2013-11-05 DIAGNOSIS — I4892 Unspecified atrial flutter: Secondary | ICD-10-CM | POA: Diagnosis not present

## 2013-11-05 DIAGNOSIS — R079 Chest pain, unspecified: Secondary | ICD-10-CM

## 2013-11-05 DIAGNOSIS — I5032 Chronic diastolic (congestive) heart failure: Secondary | ICD-10-CM

## 2013-11-05 DIAGNOSIS — I272 Pulmonary hypertension, unspecified: Secondary | ICD-10-CM

## 2013-11-05 LAB — BASIC METABOLIC PANEL
Anion gap: 14 (ref 5–15)
BUN: 9 mg/dL (ref 6–23)
CALCIUM: 9.2 mg/dL (ref 8.4–10.5)
CO2: 28 mEq/L (ref 19–32)
Chloride: 99 mEq/L (ref 96–112)
Creatinine, Ser: 0.67 mg/dL (ref 0.50–1.10)
GFR calc Af Amer: >90 mL/min (ref >90)
Glucose, Bld: 132 mg/dL — ABNORMAL HIGH (ref 70–99)
Potassium: 3.4 mEq/L — ABNORMAL LOW (ref 3.7–5.3)
SODIUM: 141 meq/L (ref 137–147)

## 2013-11-05 LAB — PRO B NATRIURETIC PEPTIDE: Pro B Natriuretic peptide (BNP): 107.3 pg/mL (ref 0–125)

## 2013-11-05 MED ORDER — SILDENAFIL CITRATE 20 MG PO TABS: 20.0000 mg | ORAL_TABLET | Freq: Three times a day (TID) | ORAL | Status: DC

## 2013-11-05 NOTE — Patient Instructions (Signed)
Take 1 extra dose of Torsemide today  Labs today  Your physician has requested that you have an echocardiogram. Echocardiography is a painless test that uses sound waves to create images of your heart. It provides your doctor with information about the size and shape of your heart and how well your heart's chambers and valves are working. This procedure takes approximately one hour. There are no restrictions for this procedure.  Your physician has requested that you have a lexiscan myoview. For further information please visit [email or web address]. Please follow instruction sheet, as given.  We will contact you in 3 months to schedule your next appointment.

## 2013-11-05 NOTE — Addendum Note (Signed)
Encounter addended by: Scarlette Calico, RN on: 11/05/2013 11:10 AM<BR>     Documentation filed: Visit Diagnoses, Dx Association, Patient Instructions Section, Orders

## 2013-11-05 NOTE — Progress Notes (Signed)
[name], female   DOB: 02-18-1960, 53 y.o.   MRN: [id number]   Cardiologist: Dr Kelby Aline    HPI: [name] is a 53 y.o. 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   She was admitted to [facility] in July 2014 with respiratory failure and RHC showed severe PAH with pulmonary pressures in the 90s. She was transferred to [facility] where she was started on Remodulin with excellent results.  07/2012 On dopamine 7.5 mcg/kg/min  RA 20  PAP 95/49  PCWP 15  SVR 2067  PVR 16.6 [place]  CO/CI 2.9/1.38  She was admitted again 08/16/12 for CP and tachycardia, was found to be in atypical flutter vs atrial tach. Amiodarone was considered but she converted spontaneously. She was started on Xarelto. Her discharge weight was 215 lbs.     Currently on Revatio 20 tid and Remodulin at 46.   She returns for follow up. Says last week she had a couple of days where she had indigestions and a cought so she got worried and wanted to come back in to get checked out. Feels better today. Weight fluctuates. But overall is up 5-8 pounds. + Mild DOE. Denies PND/Orthopnea/presyncope/palpitations. Wears 3-4 L O2 chronically. Uses CPAP nightly.  [facility] following. Her son performs hickman catheter dressing changes. Has f/u in [facility] in January.   ECHO 08/11/12 EF 60-65% RV moderately dilated Peak PA pressure 48 mmhg  ECHO 02/28/13 EF 55-60% Grade I DD RV severely dilated and HK. Trivial TR. Septum flat Peak PA pressure 53 mmhg  Labs        09/29/12: K+ 3.6, Creatinine 0.87       02/12/13 K 3.4 Creatinine 0.67 ---> given extra potassium and started on 12.5 mg spironolactone.         05/17/2013  K 4.3 Creatinine 0.68           ROS: All systems negative except as listed  in HPI, PMH and Problem List.  Past Medical History  Diagnosis Date  . OSA (obstructive sleep apnea)     CPAP  . Venous stasis ulcer     chornic, ?followed up at wound care center, multiple courses of antibiotics in past for cellulitis, on lasix  . GERD (gastroesophageal reflux disease)   . Anemia, iron deficiency     secondary to menhorrhagia, on oral iron, also b12 def, getting monthly b12 shots  . Chronic cough     secondary to alleriges and post nasal drip  . Depression   . Diabetes mellitus     well controlled on metformin  . Hyperlipidemia   . H/O mental retardation   . Herpes   . Cor pulmonale     PA Peak pressure 57mHg  . Diastolic heart failure   . Restrictive lung disease     PFTs 06/2012 (FVC 54% predicted and FEV1 68% predicted w minimal bronchodilator response).  . CHF (congestive heart failure)   . Renal disorder   . Pulmonary hypertension   . Hypertension   . Shortness of breath   . COPD (chronic obstructive pulmonary disease)   . Cognitive impairment     Current Outpatient Prescriptions  Medication Sig Dispense Refill  . acetaminophen (TYLENOL) 500 MG tablet Take 1 tablet (500 mg total) by mouth  every 6 (six) hours as needed for mild pain, moderate pain, fever or headache. 30 tablet 0  . cetirizine (ZYRTEC ALLERGY) 10 MG tablet Take 1 tablet (10 mg total) by mouth daily. 30 tablet 2  . cyanocobalamin 500 MCG tablet Take 500 mcg by mouth daily.    . diphenhydrAMINE (BENADRYL) 25 mg capsule Take 25 mg by mouth every 6 (six) hours as needed for itching.    . fluticasone (FLONASE) 50 MCG/ACT nasal spray Place 1 spray into both nostrils daily. 16 g 2  . gabapentin (NEURONTIN) 300 MG capsule Take 2 capsules (600 mg total) by mouth 3 (three) times daily. 180 capsule 5  . loperamide (IMODIUM) 2 MG capsule Take 2 mg by mouth daily as needed for diarrhea or loose stools.    . metFORMIN (GLUCOPHAGE) 1000 MG tablet Take 1,000 mg by mouth daily with breakfast.     .  pantoprazole (PROTONIX) 20 MG tablet TAKE ONE TABLET BY MOUTH TWICE DAILY 60 tablet 5  . potassium chloride SA (K-DUR,KLOR-CON) 20 MEQ tablet Take 2 tablets (40 mEq total) by mouth 3 (three) times daily. Take extra tab when take an extra Torsemide 180 tablet 5  . pravastatin (PRAVACHOL) 20 MG tablet Take 1 tablet (20 mg total) by mouth daily. 30 tablet 6  . rivaroxaban (XARELTO) 20 MG TABS tablet Take 1 tablet (20 mg total) by mouth daily with supper. 30 tablet 6  . sildenafil (REVATIO) 20 MG tablet Take 20 mg by mouth 3 (three) times daily.    . sodium chloride 0.9 % SOLN with treprostinil 1 MG/ML SOLN Inject 80 ng/kg/min into the vein continuous. Dose 80ng/kg/min based on dosing wt of 92kg. 77m/24hours    . spironolactone (ALDACTONE) 25 MG tablet Take 0.5 tablets (12.5 mg total) by mouth daily. 90 tablet 3  . torsemide (DEMADEX) 20 MG tablet Take 80 mg by mouth daily. Take extra tab as needed for weight gain     No current facility-administered medications for this encounter.    Filed Vitals:   11/05/13 1020  BP: 92/60  Pulse: 99  Weight: 236 lb 8 oz (107.276 kg)  SpO2: 94%    Physical Exam:  General: Morbidly obese. On chronic 3 L O2 used cane to ambulate into clinic.  HEENT: normal  Neck: supple. JVP difficult to assess d/t body habitus, but does not appear overly elevated  Carotids 2+ bilat; no bruits. No lymphadenopathy or thryomegaly appreciated.  Cor: PMI nonpalpable. Mildly irregular 2/6 TR increased P2  Hickman catheter site OK Lungs: clear Abdomen: obese soft, nontender, nondistended. No hepatosplenomegaly. No bruits or masses. Good bowel sounds.  Extremities: no cyanosis, clubbing, rash, trace edema; TED hose intact Neuro: alert & orientedx3, cranial nerves grossly intact. moves all 4 extremities w/o difficulty. Affect pleasant     ASSESSMENT & PLAN:  1) Chronic diastolic HF/cor pulmonale: LV EF 60-65% RV mod reduced.  NYHA II Overall I think she looks pretty good.  Weight is up again a little bit but doesn't look too fluid overloaded on exam. Given recent symptoms with have her take one extra dose of torsemide.  Continue 12.5 mg spironolactone daily and 80 mg torsedmide daily. Continue sliding scale torsemide as needed. - Reinforced the need and importance of daily weights, a low sodium diet, and fluid restriction (less than 2 L a day). Instructed to call the HF clinic if weight increases more than 3 lbs overnight or 5 lbs in a week.  - Check BMET/BNP  2) PAH,  severe with cor pulmonale.  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 However PAH far out of proportion to L-sided pressures or restrictive lung disease. - Overall stable. NYHA II-. Doing well with IV trepostinil. Follows with [facility]. - Continue Revatio 20 mg TID. Repeat ECHO and can increase to 40 tid as needed.    3) Paroxysmal atrial flutter.  In sinus. Continue Xarelto.  4) Indigestion/chest pain - not sure what is causing this. Says she is trying out a lot of new foods lately. Will diurese a little and see if this helps. Will do Lexiscan Myoview to exclude severe ischemia. Continue Protonix. If getting worse contact me.   Follow up in 3 months  Benay Spice   10:55 AM

## 2013-11-07 ENCOUNTER — Ambulatory Visit (HOSPITAL_BASED_OUTPATIENT_CLINIC_OR_DEPARTMENT_OTHER): Payer: Medicaid Other

## 2013-11-07 ENCOUNTER — Other Ambulatory Visit (HOSPITAL_COMMUNITY): Payer: Self-pay | Admitting: Internal Medicine

## 2013-11-07 ENCOUNTER — Ambulatory Visit (HOSPITAL_COMMUNITY): Payer: Medicaid Other | Attending: Cardiovascular Disease | Admitting: Radiology

## 2013-11-07 DIAGNOSIS — E119 Type 2 diabetes mellitus without complications: Secondary | ICD-10-CM | POA: Diagnosis not present

## 2013-11-07 DIAGNOSIS — I10 Essential (primary) hypertension: Secondary | ICD-10-CM | POA: Insufficient documentation

## 2013-11-07 DIAGNOSIS — I270 Primary pulmonary hypertension: Secondary | ICD-10-CM | POA: Diagnosis present

## 2013-11-07 DIAGNOSIS — I5032 Chronic diastolic (congestive) heart failure: Secondary | ICD-10-CM

## 2013-11-07 DIAGNOSIS — R079 Chest pain, unspecified: Secondary | ICD-10-CM | POA: Diagnosis not present

## 2013-11-07 DIAGNOSIS — I272 Pulmonary hypertension, unspecified: Secondary | ICD-10-CM

## 2013-11-07 MED ORDER — TECHNETIUM TC 99M SESTAMIBI GENERIC - CARDIOLITE
33.0000 | Freq: Once | INTRAVENOUS | Status: AC | PRN
  Administered 2013-11-07: 33 via INTRAVENOUS

## 2013-11-07 MED ORDER — REGADENOSON 0.4 MG/5ML IV SOLN
0.4000 mg | Freq: Once | INTRAVENOUS | Status: AC
  Administered 2013-11-07: 0.4 mg via INTRAVENOUS

## 2013-11-07 NOTE — Progress Notes (Signed)
2D Echo completed. 11/07/2013

## 2013-11-07 NOTE — Progress Notes (Signed)
[place] [facility]UCLE[facility] [place], [place] [address] [phone]    Cardiology Nuclear Med Study  [name] is a 53 y.o. female     MRN : [id number]     DOB: 26-Sep-1960  Procedure Date: 11/07/2013  Nuclear Med Background Indication for Stress Test:  Evaluation for Ischemia History: No prior known history of CAD, Paroxysmal Atrial Flutter, Severe Pulmonary Hypertension Cardiac Risk Factors: Hypertension and NIDDM  Symptoms: Chest pain with/without exertion (last occurrence couple days ago), DOE and SOB   Nuclear Pre-Procedure Caffeine/Decaff Intake:  None> 12 hrs NPO After: 10:30pm   Lungs:  clear O2 Sat: 98%% on 3 liters/min via Patient connected to nasal cannula oxygen. IV 0.9% NS with Angio Cath:  22g  IV Site: L Antecubital x 1, tolerated well IV Started by:  Irven Baltimore, RN  Chest Size (in):  50 Cup Size: DD  Height: _0  (1.626 m)  Weight:  232 lb (105.235 kg)  BMI:  Body mass index is 39.8 kg/(m^2). Tech Comments:  Patient held metformin this am.Patsy Edwards, Therapist, sports.    Nuclear Med Study 1 or 2 day study: 2 day  Stress Test Type:  Carlton Adam  Reading MD: N/A  Order Authorizing Provider:  Glori Bickers, MD  Resting Radionuclide: Technetium 6mSestamibi  Resting Radionuclide Dose: 33.0 mCi  On      11-08-13  Stress Radionuclide:  Technetium 937mestamibi  Stress Radionuclide Dose: 33.0 mCi  On         11-07-13          Stress Protocol Rest HR: 76 Stress HR: 97  Rest BP: 102/57 Stress BP: 109/42  Exercise Time (min): n/a METS: n/a   Predicted Max HR: 168 bpm % Max HR: 57.74 bpm Rate Pressure Product: 10573   Dose of Adenosine (mg):  n/a Dose of Lexiscan: 0.4 mg  Dose of Atropine (mg): n/a Dose of Dobutamine: n/a mcg/kg/min (at max HR)  Stress Test Technologist: PaIrven BaltimoreRN  Nuclear Technologist:  ElEarl ManyCNMT     Rest Procedure:  Myocardial perfusion imaging was performed at rest 45 minutes following the  intravenous administration of Technetium 9948mstamibi. Rest ECG: NSR - Normal EKG  Stress Procedure:  The patient received IV Lexiscan 0.4 mg over 15-seconds.  Technetium 3m28mtamibi injected at 30-seconds.  The patient complained of epigastric pain, tingling (R) hand, and burning sensation in chest with Lexiscan.Quantitative spect images were obtained after a 45 minute delay. Stress ECG: No significant change from baseline ECG  QPS Raw Data Images: Soft tissue (diaphragm, bowel activity, breast) surround heart.   Stress Images:  Medium defect in the distal anterior, distal anteroseptal and apical walls.   Rest Images: Improvement in above regions.   Subtraction (SDS):  These findings are consistent with ischemia. Transient Ischemic Dilatation (Normal <1.22):  1.02 Lung/Heart Ratio (Normal <0.45):  0.62  Quantitative Gated Spect Images QGS EDV:  113 ml QGS ESV:  29 ml  Impression Exercise Capacity:  Lexiscan with no exercise. BP Response:  Normal blood pressure response. Clinical Symptoms:  No chest pain. ECG Impression:  No significant ST segment change suggestive of ischemia. Comparison with Prior Nuclear Study: No previous nuclear study performed  Overall Impression:  Medium risk scan with evidence of a medium area of ischemia in the distal anterior,distal anteroseptal and apical walls.   Note some activity in neck, not imaged well.  Consider USN to evaluate thyroid.  LV Ejection Fraction:  78%.  LV Wall Motion:  NL LV Function; NL Wall Motion   Dorris Carnes

## 2013-11-08 ENCOUNTER — Encounter (HOSPITAL_COMMUNITY): Payer: Medicaid Other | Attending: Cardiovascular Disease

## 2013-11-08 DIAGNOSIS — R0989 Other specified symptoms and signs involving the circulatory and respiratory systems: Secondary | ICD-10-CM

## 2013-11-08 MED ORDER — TECHNETIUM TC 99M SESTAMIBI GENERIC - CARDIOLITE
30.0000 | Freq: Once | INTRAVENOUS | Status: AC | PRN
  Administered 2013-11-08: 30 via INTRAVENOUS

## 2013-11-09 ENCOUNTER — Other Ambulatory Visit: Payer: Self-pay | Admitting: Internal Medicine

## 2013-11-15 ENCOUNTER — Encounter: Payer: Self-pay | Admitting: Internal Medicine

## 2013-11-15 ENCOUNTER — Ambulatory Visit (INDEPENDENT_AMBULATORY_CARE_PROVIDER_SITE_OTHER): Payer: Medicaid Other | Admitting: Internal Medicine

## 2013-11-15 ENCOUNTER — Ambulatory Visit: Payer: Medicaid Other | Admitting: *Deleted

## 2013-11-15 VITALS — BP 103/57 | HR 92 | Temp 98.7°F | Ht 63.5 in | Wt 239.7 lb

## 2013-11-15 DIAGNOSIS — Z1329 Encounter for screening for other suspected endocrine disorder: Secondary | ICD-10-CM

## 2013-11-15 DIAGNOSIS — I272 Pulmonary hypertension, unspecified: Secondary | ICD-10-CM

## 2013-11-15 DIAGNOSIS — Z23 Encounter for immunization: Secondary | ICD-10-CM

## 2013-11-15 DIAGNOSIS — I270 Primary pulmonary hypertension: Secondary | ICD-10-CM

## 2013-11-15 DIAGNOSIS — N949 Unspecified condition associated with female genital organs and menstrual cycle: Secondary | ICD-10-CM

## 2013-11-15 DIAGNOSIS — I5032 Chronic diastolic (congestive) heart failure: Secondary | ICD-10-CM

## 2013-11-15 DIAGNOSIS — E109 Type 1 diabetes mellitus without complications: Secondary | ICD-10-CM

## 2013-11-15 LAB — GLUCOSE, CAPILLARY: Glucose-Capillary: 97 mg/dL (ref 70–99)

## 2013-11-15 LAB — TSH: TSH: 2.76 u[IU]/mL (ref 0.350–4.500)

## 2013-11-15 LAB — POCT GLYCOSYLATED HEMOGLOBIN (HGB A1C): Hemoglobin A1C: 5.7

## 2013-11-15 MED ORDER — FLUCONAZOLE 150 MG PO TABS: 150.0000 mg | ORAL_TABLET | Freq: Once | ORAL | Status: DC

## 2013-11-15 NOTE — Assessment & Plan Note (Signed)
Lab Results  Component Value Date   HGBA1C 5.7 11/15/2013   HGBA1C 5.6 08/07/2013   HGBA1C 5.4 05/16/2013     Assessment: Diabetes control: good control (HgbA1C at goal) Progress toward A1C goal:  at goal Comments: Well Controlled, taking Metformin 1000 mg daily.   Plan: Medications:  continue current medications Home glucose monitoring: Frequency:   Timing:   Instruction/counseling given: discussed the need for weight loss Educational resources provided: brochure Self management tools provided:   Other plans: None

## 2013-11-15 NOTE — Assessment & Plan Note (Signed)
Patient w/ itching, mild burning, and some intermittent increased urinary frequency and mild dysuria. Urine dip negative. Suspect patient has candidal infection at this time.  -Diflucan 150 mg once -Patient due for Pap; will perform at next clinic visit

## 2013-11-15 NOTE — Assessment & Plan Note (Addendum)
Follows w/ Dr. Gilles Chiquito at [facility] for this. Currently on Remodulin + Sildenafil 20 mg tid. Recently seen by Dr. Jeffie Pollock, no new acute issues. Patient states she is interested in moving to an ALF in [place] so that she can be close to [facility] and can receive medical care and not be a burden on her son.  -Will discuss possible living options w/ clinical social work

## 2013-11-15 NOTE — Progress Notes (Signed)
[name], female   DOB: Dec 26, 1960, 53 y.o.   MRN: [id number] Internal Medicine Clinic Attending  Case discussed with Dr. Ronnald Ramp at the time of the visit.  We reviewed the resident's history and exam and pertinent patient test results.  I agree with the assessment, diagnosis, and plan of care documented in the resident's note.

## 2013-11-15 NOTE — Assessment & Plan Note (Signed)
Weight slightly increased today compared to most recent CHF clinic visit. Most likely d/t scale differences. No obvious signs of volume overload on exam. Lungs sound clear, no significant LE edema. Patient denies recent worsening of SOB. On Torsemide 80 mg daily, says [name] has been very compliant with this.

## 2013-11-15 NOTE — Assessment & Plan Note (Signed)
Recent stress test suggestive of possible thyroid irregularity. Most recent TSH from 2010 wnl.  -Will order Thyroid US and check TSH today.

## 2013-11-15 NOTE — Patient Instructions (Addendum)
General Instructions:  1. Please schedule a follow up appointment for 2-3 months.  2. Please take all medications as prescribed. Take Diflucan 150 mg once for vaginal discomfort.   3. If you have worsening of your symptoms or new symptoms arise, please call the clinic ([phone]), or go to the ER immediately if symptoms are severe.   Thank you for bringing your medicines today. This helps [place] keep you safe from mistakes.   Progress Toward Treatment Goals:  Treatment Goal 11/15/2013  Hemoglobin A1C at goal    Self Care Goals & Plans:  Self Care Goal 11/15/2013  Manage my medications take my medicines as prescribed; bring my medications to every visit; refill my medications on time  Monitor my health keep track of my blood glucose; bring my glucose meter and log to each visit  Eat healthy foods drink diet soda or water instead of juice or soda; eat more vegetables; eat foods that are low in salt; eat baked foods instead of fried foods; eat fruit for snacks and desserts  Meeting treatment goals -    Home Blood Glucose Monitoring 08/07/2013  Check my blood sugar no home glucose monitoring  When to check my blood sugar N/A     Care Management & Community Referrals:  Referral 11/15/2013  Referrals made for care management support none needed  Referrals made to community resources -

## 2013-11-15 NOTE — Progress Notes (Signed)
Subjective:   [name]   DOB: [birth year] 53 y.o.   MRN: [id number]  HPI: [name] is a 53 y.o. 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 Patient recently was seen by Dr. Haroldine Laws for her Cor Pulmonale and CHF and has continued to have some mild chest pain. Patient received an ECHO and Stress test, bot of which revealed no new significant findings. Discussed w/ Dr. Haroldine Laws, findings of possible ischemia most likely related to breast shadow. Study did suggest some possible thyroid irregularity however.  The patient has very few complaints today. She does mentions some vaginal discomfort stating that she has had some itching and foul odor recently and possibly some dysuria and increased frequency. No systemic symptoms. She states she has had a yeast infection in the past and this is very similar.  She also states that she is interested in possible assisted living as she is currently living with her son and feels that it would be better for the both of them if she moved somewhere where she could receive medical attention given her chronic Remodulin therapy and live around people her own age.   Past Medical History  Diagnosis Date  . OSA (obstructive sleep apnea)     CPAP  . Venous stasis ulcer     chornic, ?followed up at wound care center, multiple courses of antibiotics in past for cellulitis, on lasix  . GERD (gastroesophageal reflux disease)   . Anemia, iron deficiency     secondary to menhorrhagia, on oral iron, also b12 def, getting monthly b12 shots  . Chronic cough     secondary to alleriges and post nasal drip  . Depression   . Diabetes mellitus     well controlled on metformin  . Hyperlipidemia   . H/O mental retardation   . Herpes   . Cor pulmonale     PA Peak pressure 12mHg  . Diastolic heart failure   .  Restrictive lung disease     PFTs 06/2012 (FVC 54% predicted and FEV1 68% predicted w minimal bronchodilator response).  . CHF (congestive heart failure)   . Renal disorder   . Pulmonary hypertension   . Hypertension   . Shortness of breath   . COPD (chronic obstructive pulmonary disease)   . Cognitive impairment    Current Outpatient Prescriptions  Medication Sig Dispense Refill  . acetaminophen (TYLENOL) 500 MG tablet Take 1 tablet (500 mg total) by mouth every 6 (six) hours as needed for mild pain, moderate pain, fever or headache. 30 tablet 0  . cetirizine (ZYRTEC) 10 MG tablet TAKE ONE TABLET BY MOUTH ONCE DAILY 30 tablet 1  . cyanocobalamin 500 MCG tablet Take 500 mcg by mouth daily.    . diphenhydrAMINE (BENADRYL) 25 mg capsule Take 25 mg by mouth every 6 (six) hours as needed for itching.    . fluconazole (DIFLUCAN) 150 MG tablet Take 1 tablet (150 mg total) by mouth once. 1 tablet 0  . fluticasone (FLONASE) 50 MCG/ACT nasal spray Place 1 spray into both nostrils daily. 16 g 2  . gabapentin (NEURONTIN) 300 MG capsule Take 2 capsules (600 mg total) by mouth 3 (three) times daily. 180 capsule 5  . loperamide (IMODIUM) 2 MG capsule Take 2 mg by mouth daily as needed for diarrhea or loose stools.    . metFORMIN (  GLUCOPHAGE) 1000 MG tablet Take 1,000 mg by mouth daily with breakfast.     . pantoprazole (PROTONIX) 20 MG tablet TAKE ONE TABLET BY MOUTH TWICE DAILY 60 tablet 5  . potassium chloride SA (K-DUR,KLOR-CON) 20 MEQ tablet Take 2 tablets (40 mEq total) by mouth 3 (three) times daily. Take extra tab when take an extra Torsemide 180 tablet 5  . pravastatin (PRAVACHOL) 20 MG tablet Take 1 tablet (20 mg total) by mouth daily. 30 tablet 6  . rivaroxaban (XARELTO) 20 MG TABS tablet Take 1 tablet (20 mg total) by mouth daily with supper. 30 tablet 6  . sildenafil (REVATIO) 20 MG tablet Take 1 tablet (20 mg total) by mouth 3 (three) times daily. 270 tablet 3  . sodium chloride 0.9 % SOLN  with treprostinil 1 MG/ML SOLN Inject 80 ng/kg/min into the vein continuous. Dose 80ng/kg/min based on dosing wt of 92kg. 8m/24hours    . spironolactone (ALDACTONE) 25 MG tablet Take 0.5 tablets (12.5 mg total) by mouth daily. 90 tablet 3  . torsemide (DEMADEX) 20 MG tablet Take 80 mg by mouth daily. Take extra tab as needed for weight gain     No current facility-administered medications for this visit.   Family History  Problem Relation Age of Onset  . Mental illness Sister   . Mental retardation Brother    History   Social History  . Marital Status: Single    Spouse Name: N/A    Number of Children: N/A  . Years of Education: N/A   Social History Main Topics  . Smoking status: Never Smoker   . Smokeless tobacco: Never Used  . Alcohol Use: No  . Drug Use: No  . Sexual Activity: None   Other Topics Concern  . None   Social History Narrative   Review of Systems: General: Denies fever, chills, diaphoresis, appetite change and fatigue.  Respiratory: Denies SOB, cough, chest tightness, and wheezing.   Cardiovascular: Denies chest pain, palpitations and leg swelling.  Gastrointestinal: Denies nausea, vomiting, abdominal pain, diarrhea, constipation, blood in stool and abdominal distention.  Genitourinary: Denies dysuria, urgency, frequency, hematuria, flank pain and difficulty urinating.  Endocrine: Denies hot or cold intolerance, sweats, polyuria, polydipsia. Musculoskeletal: Denies back pain, joint swelling, and gait problem.  Skin: Positive for rash. Denies pallor and wounds.  Neurological: Denies dizziness, seizures, syncope, weakness, lightheadedness, numbness and headaches.  Psychiatric/Behavioral: Denies mood changes, confusion, nervousness, sleep disturbance and agitation.  Objective:  Physical Exam: Filed Vitals:   11/15/13 1533  BP: 103/57  Pulse: 92  Temp: 98.7 F (37.1 C)  TempSrc: Oral  Height: 5' 3.5" (1.613 m)  Weight: 239 lb 11.2 oz (108.727 kg)    SpO2: 97%   General: Vital signs reviewed.  Patient is a very pleasant obese female, in no acute distress and cooperative with exam.  Head: Normocephalic and atraumatic. Eyes: PERRL, EOMI, conjunctivae normal, No scleral icterus.  Neck: Supple, trachea midline, normal ROM, No JVD, masses, thyromegaly, or carotid bruit present.  Cardiovascular: RRR, S1 normal, S2 normal, no murmurs, gallops, or rubs. Pulmonary/Chest: Normal respiratory effort, CTAB, no wheezes, rales, or rhonchi. On 4L O2 via [place]. Abdominal: Soft, non-tender, non-distended, bowel sounds are normal, no masses, organomegaly, or guarding present.  Musculoskeletal: No joint deformities, erythema, or stiffness, ROM full and no nontender. Extremities: Trace pitting edema in LE's, pulses symmetric and intact bilaterally. No cyanosis or clubbing. Neurological: A&O x3, Strength is normal and symmetric bilaterally, cranial nerve II-XII are grossly intact, no focal  motor deficit, sensory intact to light touch bilaterally.  Skin: Warm, dry and intact. Rash present on UE's and chest; whitish macules, unchanged from previous exams.  Psychiatric: Normal mood and affect. Patient w/ mild mental retardation. Speech is pressured at times, behavior is normal.  Assessment & Plan:   Please see problem-based assessment and plan.

## 2013-11-16 ENCOUNTER — Encounter: Payer: Self-pay | Admitting: Licensed Clinical Social Worker

## 2013-11-16 LAB — URINALYSIS, ROUTINE W REFLEX MICROSCOPIC
Bilirubin Urine: NEGATIVE
GLUCOSE, UA: NEGATIVE mg/dL
Hgb urine dipstick: NEGATIVE
Ketones, ur: NEGATIVE mg/dL
LEUKOCYTES UA: NEGATIVE
Nitrite: NEGATIVE
PH: 5 (ref 5.0–8.0)
Protein, ur: NEGATIVE mg/dL
Specific Gravity, Urine: 1.009 (ref 1.005–1.030)
Urobilinogen, UA: 0.2 mg/dL (ref 0.0–1.0)

## 2013-11-16 LAB — MICROALBUMIN / CREATININE URINE RATIO
CREATININE, URINE: 40.6 mg/dL
MICROALB/CREAT RATIO: 32 mg/g — AB (ref 0.0–30.0)
Microalb, Ur: 1.3 mg/dL (ref <2.0)

## 2013-11-18 ENCOUNTER — Other Ambulatory Visit: Payer: Self-pay | Admitting: Internal Medicine

## 2013-12-03 ENCOUNTER — Ambulatory Visit (HOSPITAL_COMMUNITY): Payer: Medicaid Other

## 2014-01-05 ENCOUNTER — Other Ambulatory Visit: Payer: Self-pay | Admitting: Internal Medicine

## 2014-02-12 ENCOUNTER — Other Ambulatory Visit: Payer: Self-pay | Admitting: Internal Medicine

## 2014-02-20 ENCOUNTER — Other Ambulatory Visit: Payer: Self-pay | Admitting: Internal Medicine

## 2014-02-20 DIAGNOSIS — Z1231 Encounter for screening mammogram for malignant neoplasm of breast: Secondary | ICD-10-CM

## 2014-02-21 ENCOUNTER — Encounter (HOSPITAL_COMMUNITY): Payer: Self-pay | Admitting: *Deleted

## 2014-02-21 ENCOUNTER — Ambulatory Visit (HOSPITAL_COMMUNITY)
Admission: RE | Admit: 2014-02-21 | Discharge: 2014-02-21 | Disposition: A | Discharge status: Home / Self Care | Payer: Medicaid Other | Source: Ambulatory Visit | Attending: Internal Medicine | Admitting: Internal Medicine

## 2014-02-21 ENCOUNTER — Other Ambulatory Visit: Payer: Self-pay | Admitting: *Deleted

## 2014-02-21 VITALS — BP 100/50 | HR 93 | Wt 244.8 lb

## 2014-02-21 DIAGNOSIS — E785 Hyperlipidemia, unspecified: Secondary | ICD-10-CM | POA: Insufficient documentation

## 2014-02-21 DIAGNOSIS — I4892 Unspecified atrial flutter: Secondary | ICD-10-CM

## 2014-02-21 DIAGNOSIS — I5032 Chronic diastolic (congestive) heart failure: Secondary | ICD-10-CM | POA: Diagnosis present

## 2014-02-21 DIAGNOSIS — I270 Primary pulmonary hypertension: Secondary | ICD-10-CM

## 2014-02-21 DIAGNOSIS — I272 Other secondary pulmonary hypertension: Secondary | ICD-10-CM | POA: Diagnosis not present

## 2014-02-21 DIAGNOSIS — I10 Essential (primary) hypertension: Secondary | ICD-10-CM | POA: Insufficient documentation

## 2014-02-21 DIAGNOSIS — J449 Chronic obstructive pulmonary disease, unspecified: Secondary | ICD-10-CM | POA: Insufficient documentation

## 2014-02-21 DIAGNOSIS — R9439 Abnormal result of other cardiovascular function study: Secondary | ICD-10-CM

## 2014-02-21 DIAGNOSIS — E119 Type 2 diabetes mellitus without complications: Secondary | ICD-10-CM | POA: Diagnosis not present

## 2014-02-21 DIAGNOSIS — K219 Gastro-esophageal reflux disease without esophagitis: Secondary | ICD-10-CM | POA: Insufficient documentation

## 2014-02-21 DIAGNOSIS — Z79899 Other long term (current) drug therapy: Secondary | ICD-10-CM | POA: Diagnosis not present

## 2014-02-21 DIAGNOSIS — R079 Chest pain, unspecified: Secondary | ICD-10-CM

## 2014-02-21 DIAGNOSIS — G4733 Obstructive sleep apnea (adult) (pediatric): Secondary | ICD-10-CM | POA: Diagnosis not present

## 2014-02-21 DIAGNOSIS — I2781 Cor pulmonale (chronic): Secondary | ICD-10-CM | POA: Diagnosis not present

## 2014-02-21 DIAGNOSIS — Z7901 Long term (current) use of anticoagulants: Secondary | ICD-10-CM | POA: Diagnosis not present

## 2014-02-21 DIAGNOSIS — F329 Major depressive disorder, single episode, unspecified: Secondary | ICD-10-CM | POA: Diagnosis not present

## 2014-02-21 NOTE — Patient Instructions (Signed)
Labs today  Your physician has requested that you have a cardiac catheterization. Cardiac catheterization is used to diagnose and/or treat various heart conditions. Doctors may recommend this procedure for a number of different reasons. The most common reason is to evaluate chest pain. Chest pain can be a symptom of coronary artery disease (CAD), and cardiac catheterization can show whether plaque is narrowing or blocking your heart's arteries. This procedure is also used to evaluate the valves, as well as measure the blood flow and oxygen levels in different parts of your heart. For further information please visit [email or web address]. Please follow instruction sheet, as given.  Thursday 2/25, SEE INSTRUCTION SHEET  Your physician recommends that you schedule a follow-up appointment in: 3 months

## 2014-02-21 NOTE — Addendum Note (Signed)
Encounter addended by: Scarlette Calico, RN on: 02/21/2014 10:22 AM<BR>     Documentation filed: [employer], Patient Instructions Section, Orders

## 2014-02-21 NOTE — Progress Notes (Signed)
Patient ID: Dian A Dashner, female   DOB: 01/31/1960, 53 y.o.   MRN: [id number]   Cardiologist: Dr Fortin [facility]    HPI: Ms.Sala A Ballweg is a 53 y.o. 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   She was admitted to [facility] in July 2014 with respiratory failure and RHC showed severe PAH with pulmonary pressures in the 90s. She was transferred to [facility] where she was started on Remodulin with excellent results.  07/2012 On dopamine 7.5 mcg/kg/min  RA 20  PAP 95/49  PCWP 15  SVR 2067  PVR 16.6 Woods  CO/CI 2.9/1.38  She was admitted again 08/16/12 for CP and tachycardia, was found to be in atypical flutter vs atrial tach. Amiodarone was considered but she converted spontaneously. She was started on Xarelto. Her discharge weight was 215 lbs.     Currently on Revatio 20 tid and Remodulin at 42.   Had CP in 11/15 and Myoview with EF 78% with reversible defect in distal anterior wall and apex. (ischemiA vs breast attenuation) EF 78%  She returns for follow up. Saw Dr. Fortin at [facility] 01/09/14 . Was doing well. Dr. Fortin was concerned about 30 pound weight gain and told her to watch her diet.  Says she is doing ok. Can walk anywhere she wants as long as she uses her oxygen and rolling walker. Still with occasional CP. + stable DOE. Denies PND/Orthopnea/presyncope/palpitations. Wears 3-4 L O2 chronically. Uses CPAP nightly.  [facility] following. Her son performs hickman catheter dressing changes and mixes meds. Has gained another 5 pounds. Bow up 35-40 pounds over past 18 months. Trying to watch her diet. Says she likes cookies. No bleeding with Xarelto  ECHO 08/11/12 EF 60-65% RV moderately dilated Peak PA pressure 48 mmhg  ECHO 02/28/13 EF 55-60% Grade I DD RV severely dilated  and HK. Trivial TR. Septum flat Peak PA pressure 53 mmhg ECHO 11/15 EF 55-60% RV normal Trivial TR ECHO 1/16 at [facility] EF >55% RV mildly dilated (improved function) Trivial TR normal IVC  Labs        09/29/12: K+ 3.6, Creatinine 0.87       02/12/13 K 3.4 Creatinine 0.67 ---> given extra potassium and started on 12.5 mg spironolactone.         05/17/2013  K 4.3 Creatinine 0.68        11/15  K 3.4 Cr 0.67          ROS: All systems negative except as listed in HPI, PMH and Problem List.  Past Medical History  Diagnosis Date  . OSA (obstructive sleep apnea)     CPAP  . Venous stasis ulcer     chornic, ?followed up at wound care center, multiple courses of antibiotics in past for cellulitis, on lasix  . GERD (gastroesophageal reflux disease)   . Anemia, iron deficiency     secondary to menhorrhagia, on oral iron, also b12 def, getting monthly b12 shots  . Chronic cough     secondary to alleriges and post nasal drip  . Depression   . Diabetes mellitus     well controlled on metformin  . Hyperlipidemia   . H/O mental retardation   . Herpes   . Cor pulmonale     PA Peak pressure 89mmHg  . Diastolic heart   failure   . Restrictive lung disease     PFTs 06/2012 (FVC 54% predicted and FEV1 68% predicted w minimal bronchodilator response).  . CHF (congestive heart failure)   . Renal disorder   . Pulmonary hypertension   . Hypertension   . Shortness of breath   . COPD (chronic obstructive pulmonary disease)   . Cognitive impairment     Current Outpatient Prescriptions  Medication Sig Dispense Refill  . ACCU-CHEK AVIVA PLUS test strip USE  STRIP TO CHECK GLUCOSE ONCE DAILY 100 each 11  . acetaminophen (TYLENOL) 500 MG tablet Take 1 tablet (500 mg total) by mouth every 6 (six) hours as needed for mild pain, moderate pain, fever or headache. 30 tablet 0  . cetirizine (ZYRTEC) 10 MG tablet TAKE ONE TABLET BY MOUTH ONCE DAILY 30 tablet 2  . cyanocobalamin 500 MCG tablet Take 500 mcg by mouth  daily.    . diphenhydrAMINE (BENADRYL) 25 mg capsule Take 25 mg by mouth every 6 (six) hours as needed for itching.    . gabapentin (NEURONTIN) 300 MG capsule Take 2 capsules (600 mg total) by mouth 3 (three) times daily. 180 capsule 5  . loperamide (IMODIUM) 2 MG capsule Take 2 mg by mouth daily as needed for diarrhea or loose stools.    . metFORMIN (GLUCOPHAGE) 1000 MG tablet Take 1,000 mg by mouth daily with breakfast.     . pantoprazole (PROTONIX) 20 MG tablet TAKE ONE TABLET BY MOUTH TWICE DAILY 60 tablet 5  . potassium chloride SA (K-DUR,KLOR-CON) 20 MEQ tablet Take 2 tablets (40 mEq total) by mouth 3 (three) times daily. Take extra tab when take an extra Torsemide 180 tablet 5  . pravastatin (PRAVACHOL) 20 MG tablet TAKE ONE TABLET BY MOUTH ONCE DAILY 30 tablet 5  . rivaroxaban (XARELTO) 20 MG TABS tablet Take 1 tablet (20 mg total) by mouth daily with supper. 30 tablet 6  . sildenafil (REVATIO) 20 MG tablet Take 1 tablet (20 mg total) by mouth 3 (three) times daily. 270 tablet 3  . sodium chloride 0.9 % SOLN with treprostinil 1 MG/ML SOLN Inject 80 ng/kg/min into the vein continuous. Dose 80ng/kg/min based on dosing wt of 92kg. 57m/24hours    . spironolactone (ALDACTONE) 25 MG tablet Take 0.5 tablets (12.5 mg total) by mouth daily. 90 tablet 3  . torsemide (DEMADEX) 20 MG tablet Take 80 mg by mouth daily. Take extra tab as needed for weight gain    . fluticasone (FLONASE) 50 MCG/ACT nasal spray Place 1 spray into both nostrils daily. 16 g 2   No current facility-administered medications for this encounter.    Filed Vitals:   02/21/14 0947  BP: 100/50  Pulse: 93  Weight: 244 lb 12.8 oz (111.041 kg)  SpO2: 94%    Physical Exam:  General: Morbidly obese. On chronic 3 L O2 used cane to ambulate into clinic.  HEENT: normal  Neck: supple. JVP difficult to assess d/t body habitus, but does not appear overly elevated  Carotids 2+ bilat; no bruits. No lymphadenopathy or thryomegaly  appreciated.  Cor: PMI nonpalpable. Mildly irregular 2/6 TR increased P2  Hickman catheter site OK Lungs: clear Abdomen: obese soft, nontender, nondistended. No hepatosplenomegaly. No bruits or masses. Good bowel sounds.  Extremities: no cyanosis, clubbing, rash, trace edema; TED hose intact Neuro: alert & orientedx3, cranial nerves grossly intact. moves all 4 extremities w/o difficulty. Affect pleasant    ASSESSMENT & PLAN:  1) Chronic diastolic HF/cor pulmonale:  -Overall I  think she looks pretty good. Echo with improved RV function -Weight is up again a little bit but doesn't look too fluid overloaded on exam. Given recent symptoms with have her take one extra dose of torsemide.  -Continue current meds.Continue sliding scale torsemide as needed. - Reinforced the need and importance of daily weights, a low sodium diet, and fluid restriction (less than 2 L a day). Instructed to call the HF clinic if weight increases more than 3 lbs overnight or 5 lbs in a week.  - Check BMET/BNP - Encouraged weight loss.   2) PAH, severe with cor pulmonale.  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 However PAH far out of proportion to L-sided pressures or restrictive lung disease. - Overall stable. NYHA II. Doing well with IV trepostinil and sildenafill. Follows with [facility] current regimen.   3) Paroxysmal atrial flutter.  In sinus. Continue Xarelto.  4) Chest pain with positive stress test - Still with sx. We reviewed stress test and risks of cath versus medical therapy. She would like to proceed with cath. We will schedule R/L HC  Total time spent 45 minutes. Over half that time spent discussing above.   Follow up in 3 months.  Daniel Bensimhon,MD   9:59 AM

## 2014-02-22 ENCOUNTER — Telehealth (HOSPITAL_COMMUNITY): Payer: Self-pay | Admitting: Cardiology

## 2014-02-22 NOTE — Telephone Encounter (Signed)
Pt scheduled for R/L HC on 02/28/14 with Dr.Bensimhon Cpt code 93458 icd 10- i50.22 With pts current insurance-[employer]

## 2014-02-27 ENCOUNTER — Ambulatory Visit (HOSPITAL_COMMUNITY)
Admission: RE | Admit: 2014-02-27 | Discharge: 2014-02-27 | Disposition: A | Discharge status: Home / Self Care | Payer: Medicaid Other | Source: Ambulatory Visit | Attending: Internal Medicine | Admitting: Internal Medicine

## 2014-02-27 DIAGNOSIS — Z1231 Encounter for screening mammogram for malignant neoplasm of breast: Secondary | ICD-10-CM | POA: Diagnosis present

## 2014-02-28 ENCOUNTER — Encounter (HOSPITAL_COMMUNITY): Payer: Self-pay | Admitting: *Deleted

## 2014-02-28 ENCOUNTER — Encounter (HOSPITAL_COMMUNITY)
Admission: RE | Disposition: A | Discharge status: Home / Self Care | Payer: Self-pay | Source: Ambulatory Visit | Attending: Internal Medicine

## 2014-02-28 ENCOUNTER — Ambulatory Visit (HOSPITAL_COMMUNITY)
Admission: RE | Admit: 2014-02-28 | Discharge: 2014-02-28 | Disposition: A | Discharge status: Home / Self Care | Payer: Medicaid Other | Source: Ambulatory Visit | Attending: Internal Medicine | Admitting: Internal Medicine

## 2014-02-28 DIAGNOSIS — I272 Other secondary pulmonary hypertension: Secondary | ICD-10-CM | POA: Insufficient documentation

## 2014-02-28 DIAGNOSIS — Z7951 Long term (current) use of inhaled steroids: Secondary | ICD-10-CM | POA: Insufficient documentation

## 2014-02-28 DIAGNOSIS — I2510 Atherosclerotic heart disease of native coronary artery without angina pectoris: Secondary | ICD-10-CM | POA: Diagnosis not present

## 2014-02-28 DIAGNOSIS — G4733 Obstructive sleep apnea (adult) (pediatric): Secondary | ICD-10-CM | POA: Insufficient documentation

## 2014-02-28 DIAGNOSIS — I4892 Unspecified atrial flutter: Secondary | ICD-10-CM | POA: Diagnosis not present

## 2014-02-28 DIAGNOSIS — E785 Hyperlipidemia, unspecified: Secondary | ICD-10-CM | POA: Insufficient documentation

## 2014-02-28 DIAGNOSIS — G3184 Mild cognitive impairment, so stated: Secondary | ICD-10-CM | POA: Diagnosis not present

## 2014-02-28 DIAGNOSIS — I270 Primary pulmonary hypertension: Secondary | ICD-10-CM

## 2014-02-28 DIAGNOSIS — N189 Chronic kidney disease, unspecified: Secondary | ICD-10-CM | POA: Diagnosis not present

## 2014-02-28 DIAGNOSIS — Z9989 Dependence on other enabling machines and devices: Secondary | ICD-10-CM | POA: Insufficient documentation

## 2014-02-28 DIAGNOSIS — I5032 Chronic diastolic (congestive) heart failure: Secondary | ICD-10-CM | POA: Insufficient documentation

## 2014-02-28 DIAGNOSIS — Z6841 Body Mass Index (BMI) 40.0 and over, adult: Secondary | ICD-10-CM | POA: Diagnosis not present

## 2014-02-28 DIAGNOSIS — R079 Chest pain, unspecified: Secondary | ICD-10-CM

## 2014-02-28 DIAGNOSIS — J449 Chronic obstructive pulmonary disease, unspecified: Secondary | ICD-10-CM | POA: Insufficient documentation

## 2014-02-28 DIAGNOSIS — K219 Gastro-esophageal reflux disease without esophagitis: Secondary | ICD-10-CM | POA: Diagnosis not present

## 2014-02-28 DIAGNOSIS — F79 Unspecified intellectual disabilities: Secondary | ICD-10-CM | POA: Insufficient documentation

## 2014-02-28 DIAGNOSIS — E119 Type 2 diabetes mellitus without complications: Secondary | ICD-10-CM | POA: Insufficient documentation

## 2014-02-28 DIAGNOSIS — Z79899 Other long term (current) drug therapy: Secondary | ICD-10-CM | POA: Insufficient documentation

## 2014-02-28 DIAGNOSIS — Z7901 Long term (current) use of anticoagulants: Secondary | ICD-10-CM | POA: Diagnosis not present

## 2014-02-28 HISTORY — PX: LEFT AND RIGHT HEART CATHETERIZATION WITH CORONARY ANGIOGRAM: SHX5449

## 2014-02-28 LAB — POCT I-STAT 3, VENOUS BLOOD GAS (G3P V)
ACID-BASE EXCESS: 3 mmol/L — AB (ref 0.0–2.0)
Acid-Base Excess: 5 mmol/L — ABNORMAL HIGH (ref 0.0–2.0)
BICARBONATE: 27.3 meq/L — AB (ref 20.0–24.0)
Bicarbonate: 29.1 mEq/L — ABNORMAL HIGH (ref 20.0–24.0)
O2 SAT: 71 %
O2 Saturation: 70 %
PCO2 VEN: 41.5 mmHg — AB (ref 45.0–50.0)
TCO2: 29 mmol/L (ref 0–100)
TCO2: 30 mmol/L (ref 0–100)
pCO2, Ven: 40.7 mmHg — ABNORMAL LOW (ref 45.0–50.0)
pH, Ven: 7.435 — ABNORMAL HIGH (ref 7.250–7.300)
pH, Ven: 7.454 — ABNORMAL HIGH (ref 7.250–7.300)
pO2, Ven: 35 mmHg (ref 30.0–45.0)
pO2, Ven: 35 mmHg (ref 30.0–45.0)

## 2014-02-28 LAB — BASIC METABOLIC PANEL
Anion gap: 7 (ref 5–15)
BUN: 10 mg/dL (ref 6–23)
CHLORIDE: 101 mmol/L (ref 96–112)
CO2: 29 mmol/L (ref 19–32)
CREATININE: 0.78 mg/dL (ref 0.50–1.10)
Calcium: 8.8 mg/dL (ref 8.4–10.5)
GFR calc non Af Amer: >90 mL/min (ref >90)
GLUCOSE: 101 mg/dL — AB (ref 70–99)
Potassium: 4.1 mmol/L (ref 3.5–5.1)
Sodium: 137 mmol/L (ref 135–145)

## 2014-02-28 LAB — POCT I-STAT 3, ART BLOOD GAS (G3+)
Acid-Base Excess: 4 mmol/L — ABNORMAL HIGH (ref 0.0–2.0)
Bicarbonate: 27.5 mEq/L — ABNORMAL HIGH (ref 20.0–24.0)
O2 Saturation: 99 %
PCO2 ART: 34.3 mmHg — AB (ref 35.0–45.0)
PH ART: 7.512 — AB (ref 7.350–7.450)
TCO2: 29 mmol/L (ref 0–100)
pO2, Arterial: 126 mmHg — ABNORMAL HIGH (ref 80.0–100.0)

## 2014-02-28 LAB — CBC
HCT: 31.6 % — ABNORMAL LOW (ref 36.0–46.0)
HEMOGLOBIN: 9.7 g/dL — AB (ref 12.0–15.0)
MCH: 22.4 pg — ABNORMAL LOW (ref 26.0–34.0)
MCHC: 30.7 g/dL (ref 30.0–36.0)
MCV: 72.8 fL — ABNORMAL LOW (ref 78.0–100.0)
Platelets: 629 10*3/uL — ABNORMAL HIGH (ref 150–400)
RBC: 4.34 MIL/uL (ref 3.87–5.11)
RDW: 18.7 % — ABNORMAL HIGH (ref 11.5–15.5)
WBC: 8.1 10*3/uL (ref 4.0–10.5)

## 2014-02-28 LAB — GLUCOSE, CAPILLARY
Glucose-Capillary: 91 mg/dL (ref 70–99)
Glucose-Capillary: 85 mg/dL (ref 70–99)

## 2014-02-28 LAB — PROTIME-INR
INR: 0.93 (ref 0.00–1.49)
PROTHROMBIN TIME: 12.6 s (ref 11.6–15.2)

## 2014-02-28 SURGERY — LEFT AND RIGHT HEART CATHETERIZATION WITH CORONARY ANGIOGRAM
Anesthesia: LOCAL

## 2014-02-28 MED ORDER — HEPARIN (PORCINE) IN NACL 2-0.9 UNIT/ML-% IJ SOLN
INTRAMUSCULAR | Status: AC
  Filled 2014-02-28: qty 1500

## 2014-02-28 MED ORDER — HEPARIN SODIUM (PORCINE) 1000 UNIT/ML IJ SOLN
INTRAMUSCULAR | Status: AC
  Filled 2014-02-28: qty 1

## 2014-02-28 MED ORDER — SODIUM CHLORIDE 0.9 % IV SOLN
INTRAVENOUS | Status: DC
  Administered 2014-02-28: 17:00:00 via INTRAVENOUS

## 2014-02-28 MED ORDER — SODIUM CHLORIDE 0.9 % IV SOLN
250.0000 mL | INTRAVENOUS | Status: DC | PRN
  Administered 2014-02-28: 10 mL/h via INTRAVENOUS

## 2014-02-28 MED ORDER — LIDOCAINE HCL (PF) 1 % IJ SOLN
INTRAMUSCULAR | Status: AC
  Filled 2014-02-28: qty 30

## 2014-02-28 MED ORDER — ONDANSETRON HCL 4 MG/2ML IJ SOLN: 4.0000 mg | Freq: Four times a day (QID) | INTRAMUSCULAR | Status: DC | PRN

## 2014-02-28 MED ORDER — NITROGLYCERIN 1 MG/10 ML FOR IR/CATH LAB
INTRA_ARTERIAL | Status: AC
  Filled 2014-02-28: qty 10

## 2014-02-28 MED ORDER — SODIUM CHLORIDE 0.9 % IJ SOLN: 3.0000 mL | INTRAMUSCULAR | Status: DC | PRN

## 2014-02-28 MED ORDER — VERAPAMIL HCL 2.5 MG/ML IV SOLN
INTRAVENOUS | Status: AC
  Filled 2014-02-28: qty 2

## 2014-02-28 MED ORDER — MIDAZOLAM HCL 2 MG/2ML IJ SOLN
INTRAMUSCULAR | Status: AC
  Filled 2014-02-28: qty 2

## 2014-02-28 MED ORDER — SODIUM CHLORIDE 0.9 % IV SOLN: INTRAVENOUS | Status: DC

## 2014-02-28 MED ORDER — SODIUM CHLORIDE 0.9 % IJ SOLN: 3.0000 mL | Freq: Two times a day (BID) | INTRAMUSCULAR | Status: DC

## 2014-02-28 MED ORDER — ACETAMINOPHEN 325 MG PO TABS: 650.0000 mg | ORAL_TABLET | ORAL | Status: DC | PRN

## 2014-02-28 MED ORDER — FENTANYL CITRATE 0.05 MG/ML IJ SOLN
INTRAMUSCULAR | Status: AC
  Filled 2014-02-28: qty 2

## 2014-02-28 NOTE — Progress Notes (Signed)
IV Team in to start IV.

## 2014-02-28 NOTE — Interval H&P Note (Signed)
History and Physical Interval Note:  02/28/2014 3:20 PM  [name]  has presented today for surgery, with the diagnosis of chf  The various methods of treatment have been discussed with the patient and family. After consideration of risks, benefits and other options for treatment, the patient has consented to  Procedure(s): LEFT AND RIGHT HEART CATHETERIZATION WITH CORONARY ANGIOGRAM (N/A) and possible angioplasty as a surgical intervention .  The patient's history has been reviewed, patient examined, no change in status, stable for surgery.  I have reviewed the patient's chart and labs.  Questions were answered to the patient's satisfaction.     [name]

## 2014-02-28 NOTE — H&P (View-Only) (Signed)
[name], female   DOB: 1960-04-11, 54 y.o.   MRN: [id number]   Cardiologist: Dr Kelby Aline    HPI: [name] is a 54 y.o. 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   She was admitted to [facility] in July 2014 with respiratory failure and RHC showed severe PAH with pulmonary pressures in the 90s. She was transferred to [facility] where she was started on Remodulin with excellent results.  07/2012 On dopamine 7.5 mcg/kg/min  RA 20  PAP 95/49  PCWP 15  SVR 2067  PVR 16.6 Woods  CO/CI 2.9/1.38  She was admitted again 08/16/12 for CP and tachycardia, was found to be in atypical flutter vs atrial tach. Amiodarone was considered but she converted spontaneously. She was started on Xarelto. Her discharge weight was 215 lbs.     Currently on Revatio 20 tid and Remodulin at 34.   Had CP in 11/15 and Myoview with EF 78% with reversible defect in distal anterior wall and apex. (ischemiA vs breast attenuation) EF 78%  She returns for follow up. Saw Dr. Gilles Chiquito at [facility] 01/09/14 . Was doing well. Dr. Gilles Chiquito was concerned about 30 pound weight gain and told her to watch her diet.  Says she is doing ok. Can walk anywhere she wants as long as she uses her oxygen and rolling walker. Still with occasional CP. + stable DOE. Denies PND/Orthopnea/presyncope/palpitations. Wears 3-4 L O2 chronically. Uses CPAP nightly.  [facility] following. Her son performs hickman catheter dressing changes and mixes meds. Has gained another 5 pounds. Bow up 35-40 pounds over past 18 months. Trying to watch her diet. Says she likes cookies. No bleeding with Xarelto  ECHO 08/11/12 EF 60-65% RV moderately dilated Peak PA pressure 48 mmhg  ECHO 02/28/13 EF 55-60% Grade I DD RV severely dilated  and HK. Trivial TR. Septum flat Peak PA pressure 53 mmhg ECHO 11/15 EF 55-60% RV normal Trivial TR ECHO 1/16 at [facility] EF >55% RV mildly dilated (improved function) Trivial TR normal IVC  Labs        09/29/12: K+ 3.6, Creatinine 0.87       02/12/13 K 3.4 Creatinine 0.67 ---> given extra potassium and started on 12.5 mg spironolactone.         05/17/2013  K 4.3 Creatinine 0.68        11/15  K 3.4 Cr 0.67          ROS: All systems negative except as listed in HPI, PMH and Problem List.  Past Medical History  Diagnosis Date  . OSA (obstructive sleep apnea)     CPAP  . Venous stasis ulcer     chornic, ?followed up at wound care center, multiple courses of antibiotics in past for cellulitis, on lasix  . GERD (gastroesophageal reflux disease)   . Anemia, iron deficiency     secondary to menhorrhagia, on oral iron, also b12 def, getting monthly b12 shots  . Chronic cough     secondary to alleriges and post nasal drip  . Depression   . Diabetes mellitus     well controlled on metformin  . Hyperlipidemia   . H/O mental retardation   . Herpes   . Cor pulmonale     PA Peak pressure 70mHg  . Diastolic heart  failure   . Restrictive lung disease     PFTs 06/2012 (FVC 54% predicted and FEV1 68% predicted w minimal bronchodilator response).  . CHF (congestive heart failure)   . Renal disorder   . Pulmonary hypertension   . Hypertension   . Shortness of breath   . COPD (chronic obstructive pulmonary disease)   . Cognitive impairment     Current Outpatient Prescriptions  Medication Sig Dispense Refill  . ACCU-CHEK AVIVA PLUS test strip USE  STRIP TO CHECK GLUCOSE ONCE DAILY 100 each 11  . acetaminophen (TYLENOL) 500 MG tablet Take 1 tablet (500 mg total) by mouth every 6 (six) hours as needed for mild pain, moderate pain, fever or headache. 30 tablet 0  . cetirizine (ZYRTEC) 10 MG tablet TAKE ONE TABLET BY MOUTH ONCE DAILY 30 tablet 2  . cyanocobalamin 500 MCG tablet Take 500 mcg by mouth  daily.    . diphenhydrAMINE (BENADRYL) 25 mg capsule Take 25 mg by mouth every 6 (six) hours as needed for itching.    . gabapentin (NEURONTIN) 300 MG capsule Take 2 capsules (600 mg total) by mouth 3 (three) times daily. 180 capsule 5  . loperamide (IMODIUM) 2 MG capsule Take 2 mg by mouth daily as needed for diarrhea or loose stools.    . metFORMIN (GLUCOPHAGE) 1000 MG tablet Take 1,000 mg by mouth daily with breakfast.     . pantoprazole (PROTONIX) 20 MG tablet TAKE ONE TABLET BY MOUTH TWICE DAILY 60 tablet 5  . potassium chloride SA (K-DUR,KLOR-CON) 20 MEQ tablet Take 2 tablets (40 mEq total) by mouth 3 (three) times daily. Take extra tab when take an extra Torsemide 180 tablet 5  . pravastatin (PRAVACHOL) 20 MG tablet TAKE ONE TABLET BY MOUTH ONCE DAILY 30 tablet 5  . rivaroxaban (XARELTO) 20 MG TABS tablet Take 1 tablet (20 mg total) by mouth daily with supper. 30 tablet 6  . sildenafil (REVATIO) 20 MG tablet Take 1 tablet (20 mg total) by mouth 3 (three) times daily. 270 tablet 3  . sodium chloride 0.9 % SOLN with treprostinil 1 MG/ML SOLN Inject 80 ng/kg/min into the vein continuous. Dose 80ng/kg/min based on dosing wt of 92kg. 57m/24hours    . spironolactone (ALDACTONE) 25 MG tablet Take 0.5 tablets (12.5 mg total) by mouth daily. 90 tablet 3  . torsemide (DEMADEX) 20 MG tablet Take 80 mg by mouth daily. Take extra tab as needed for weight gain    . fluticasone (FLONASE) 50 MCG/ACT nasal spray Place 1 spray into both nostrils daily. 16 g 2   No current facility-administered medications for this encounter.    Filed Vitals:   02/21/14 0947  BP: 100/50  Pulse: 93  Weight: 244 lb 12.8 oz (111.041 kg)  SpO2: 94%    Physical Exam:  General: Morbidly obese. On chronic 3 L O2 used cane to ambulate into clinic.  HEENT: normal  Neck: supple. JVP difficult to assess d/t body habitus, but does not appear overly elevated  Carotids 2+ bilat; no bruits. No lymphadenopathy or thryomegaly  appreciated.  Cor: PMI nonpalpable. Mildly irregular 2/6 TR increased P2  Hickman catheter site OK Lungs: clear Abdomen: obese soft, nontender, nondistended. No hepatosplenomegaly. No bruits or masses. Good bowel sounds.  Extremities: no cyanosis, clubbing, rash, trace edema; TED hose intact Neuro: alert & orientedx3, cranial nerves grossly intact. moves all 4 extremities w/o difficulty. Affect pleasant    ASSESSMENT & PLAN:  1) Chronic diastolic HF/cor pulmonale:  -Overall I  think she looks pretty good. Echo with improved RV function -Weight is up again a little bit but doesn't look too fluid overloaded on exam. Given recent symptoms with have her take one extra dose of torsemide.  -Continue current meds.Continue sliding scale torsemide as needed. - Reinforced the need and importance of daily weights, a low sodium diet, and fluid restriction (less than 2 L a day). Instructed to call the HF clinic if weight increases more than 3 lbs overnight or 5 lbs in a week.  - Check BMET/BNP - Encouraged weight loss.   2) PAH, severe with cor pulmonale.  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 However PAH far out of proportion to L-sided pressures or restrictive lung disease. - Overall stable. NYHA II. Doing well with IV trepostinil and sildenafill. Follows with [facility] current regimen.   3) Paroxysmal atrial flutter.  In sinus. Continue Xarelto.  4) Chest pain with positive stress test - Still with sx. We reviewed stress test and risks of cath versus medical therapy. She would like to proceed with cath. We will schedule R/L HC  Total time spent 45 minutes. Over half that time spent discussing above.   Follow up in 3 months.  Star Resler,MD   9:59 AM

## 2014-02-28 NOTE — Progress Notes (Signed)
Site area: RFA/RFV Site Prior to Removal:  Level 0 Pressure Applied For: 79mn Manual:   yesPatient Status During Pull:  stable Post Pull Site:  Level 0 Post Pull Instructions Given:  yes Post Pull Pulses Present: palpable Dressing Applied:  clear Bedrest begins @ 1630Comments:

## 2014-02-28 NOTE — Discharge Instructions (Signed)
NO METFORMIN FOR 2 DAYS      Angiogram, Care After Refer to this sheet in the next few weeks. These instructions provide you with information on caring for yourself after your procedure. Your health care provider may also give you more specific instructions. Your treatment has been planned according to current medical practices, but problems sometimes occur. Call your health care provider if you have any problems or questions after your procedure.  WHAT TO EXPECT AFTER THE PROCEDURE After your procedure, it is typical to have the following sensations:  Minor discomfort or tenderness and a small bump at the catheter insertion site. The bump should usually decrease in size and tenderness within 1 to 2 weeks.  Any bruising will usually fade within 2 to 4 weeks. HOME CARE INSTRUCTIONS   You may need to keep taking blood thinners if they were prescribed for you. Take medicines only as directed by your health care provider.  Do not apply powder or lotion to the site.  Do not take baths, swim, or use a hot tub until your health care provider approves.  You may shower 24 hours after the procedure. Remove the bandage (dressing) and gently wash the site with plain soap and water. Gently pat the site dry.  Inspect the site at least twice daily.  Limit your activity for the first 48 hours. Do not bend, squat, or lift anything over 20 lb (9 kg) or as directed by your health care provider.  Plan to have someone take you home after the procedure. Follow instructions about when you can drive or return to work. SEEK MEDICAL CARE IF:  You get light-headed when standing up.  You have drainage (other than a small amount of blood on the dressing).  You have chills.  You have a fever.  You have redness, warmth, swelling, or pain at the insertion site. SEEK IMMEDIATE MEDICAL CARE IF:   You develop chest pain or shortness of breath, feel faint, or pass out.  You have bleeding, swelling larger  than a walnut, or drainage from the catheter insertion site.  You develop pain, discoloration, coldness, or severe bruising in the leg or arm that held the catheter.  You develop bleeding from any other place, such as the bowels. You may see bright red blood in your urine or stools, or your stools may appear black and tarry.  You have heavy bleeding from the site. If this happens, hold pressure on the site. MAKE SURE YOU:  Understand these instructions.  Will watch your condition.  Will get help right away if you are not doing well or get worse. Document Released: 07/09/2004 Document Revised: 05/07/2013 Document Reviewed: 05/15/2012 [facility] Patient Information 2015 [place], [place]. This information is not intended to replace advice given to you by your health care provider. Make sure you discuss any questions you have with your health care provider.

## 2014-02-28 NOTE — Progress Notes (Signed)
Patient has a port a cath rt upper chest, tegaderm dressing intact w/Remozalin infusing

## 2014-02-28 NOTE — CV Procedure (Signed)
Cardiac Cath Procedure Note  Indication: PAH and CP with positive stress test  Procedures performed:  1) Right heart cathererization 2) Selective coronary angiography 3) Left heart catheterization 4) Left ventriculogram  Description of procedure:     The risks and indication of the procedure were explained. Consent was signed and placed on the chart. An appropriate timeout was taken prior to the procedure. The right groin was prepped and draped in the routine sterile fashion and anesthetized with 1% local lidocaine.   A 5 FR arterial sheath was placed in the right femoral artery using a modified Seldinger technique. Standard catheters including a JL4, JR4 and angled pigtail were used. All catheter exchanges were made over a wire. A 7 FR venous sheath was placed in the right femoral vein using a modified Seldinger technique. A standard Swan-Ganz catheter was used for the procedure.   Complications:  None apparent  Findings:  Done on Flolan  RA = 8 RV = 45/5/8 PA = 52/24 (34) PCW = 11 Fick cardiac output/index = 7.6/3.6 PVR = 3.0 WU FA sat = 99% PA sat = 70%, 71%  Ao Pressure: 81/53 (67) LV Pressure: 91/1/6 There was no signficant gradient across the aortic valve on pullback.  Left main: Minimal plaque  LAD: Large vessel wraps apex. Gives off large D1. 30% tubular lesion proximally. Otherwise normal  LCX: Large dominant vessel. Large OM-1. 3PLS and PDA. Normal  RCA: Nondominant vessel. Normal.  LV-gram done in the RAO projection: Ejection fraction = 60% no regional wall motion abnormalities  Assessment:  1. Minimal nonobstructive CAD 2. Marked improvement in [facility] with Flolan now with residual mild PAH 3. Normal LVEF with low left-sided filling pressures  Plan/Discussion:  Stress test false positive likely due to body habitus. Continue current therapy. Can back off on diuretics as needed.   Glori Bickers, MD 3:45 PM

## 2014-03-04 ENCOUNTER — Other Ambulatory Visit: Payer: Self-pay | Admitting: *Deleted

## 2014-03-04 DIAGNOSIS — E1149 Type 2 diabetes mellitus with other diabetic neurological complication: Secondary | ICD-10-CM

## 2014-03-05 ENCOUNTER — Other Ambulatory Visit (HOSPITAL_COMMUNITY): Payer: Self-pay | Admitting: *Deleted

## 2014-03-05 DIAGNOSIS — I5022 Chronic systolic (congestive) heart failure: Secondary | ICD-10-CM

## 2014-03-05 MED ORDER — GABAPENTIN 300 MG PO CAPS: 600.0000 mg | ORAL_CAPSULE | Freq: Three times a day (TID) | ORAL | Status: DC

## 2014-03-05 MED ORDER — TORSEMIDE 20 MG PO TABS: 80.0000 mg | ORAL_TABLET | Freq: Every day | ORAL | Status: DC

## 2014-04-03 LAB — HM DIABETES EYE EXAM

## 2014-04-06 ENCOUNTER — Other Ambulatory Visit: Payer: Self-pay | Admitting: Internal Medicine

## 2014-04-09 ENCOUNTER — Other Ambulatory Visit (HOSPITAL_COMMUNITY): Payer: Self-pay | Admitting: *Deleted

## 2014-04-09 ENCOUNTER — Ambulatory Visit (INDEPENDENT_AMBULATORY_CARE_PROVIDER_SITE_OTHER): Payer: Medicaid Other | Admitting: Internal Medicine

## 2014-04-09 ENCOUNTER — Encounter: Payer: Self-pay | Admitting: Internal Medicine

## 2014-04-09 ENCOUNTER — Other Ambulatory Visit (HOSPITAL_COMMUNITY)
Admission: RE | Admit: 2014-04-09 | Discharge: 2014-04-09 | Disposition: A | Discharge status: Home / Self Care | Payer: Medicaid Other | Source: Ambulatory Visit | Attending: Internal Medicine | Admitting: Internal Medicine

## 2014-04-09 VITALS — BP 102/60 | HR 78 | Temp 97.9°F | Ht 63.5 in | Wt 247.1 lb

## 2014-04-09 DIAGNOSIS — I5032 Chronic diastolic (congestive) heart failure: Secondary | ICD-10-CM | POA: Diagnosis not present

## 2014-04-09 DIAGNOSIS — N949 Unspecified condition associated with female genital organs and menstrual cycle: Secondary | ICD-10-CM

## 2014-04-09 DIAGNOSIS — E119 Type 2 diabetes mellitus without complications: Secondary | ICD-10-CM | POA: Diagnosis present

## 2014-04-09 DIAGNOSIS — Z01419 Encounter for gynecological examination (general) (routine) without abnormal findings: Secondary | ICD-10-CM | POA: Diagnosis not present

## 2014-04-09 DIAGNOSIS — N760 Acute vaginitis: Secondary | ICD-10-CM | POA: Insufficient documentation

## 2014-04-09 DIAGNOSIS — Z1329 Encounter for screening for other suspected endocrine disorder: Secondary | ICD-10-CM

## 2014-04-09 DIAGNOSIS — I5022 Chronic systolic (congestive) heart failure: Secondary | ICD-10-CM

## 2014-04-09 DIAGNOSIS — Z1151 Encounter for screening for human papillomavirus (HPV): Secondary | ICD-10-CM | POA: Diagnosis present

## 2014-04-09 LAB — GLUCOSE, CAPILLARY: Glucose-Capillary: 97 mg/dL (ref 70–99)

## 2014-04-09 LAB — POCT GLYCOSYLATED HEMOGLOBIN (HGB A1C): Hemoglobin A1C: 5.9

## 2014-04-09 MED ORDER — TORSEMIDE 20 MG PO TABS: 80.0000 mg | ORAL_TABLET | Freq: Every day | ORAL | Status: DC

## 2014-04-09 MED ORDER — FLUCONAZOLE 150 MG PO TABS: 150.0000 mg | ORAL_TABLET | ORAL | Status: DC

## 2014-04-09 NOTE — Patient Instructions (Signed)
General Instructions:  1. Please schedule a follow up for 6 months.   2. Please take all medications as previously prescribed with the following changes:  Take Diflucan 150 mg every 3 days for 3 doses.   3. If you have worsening of your symptoms or new symptoms arise, please call the clinic ([phone]), or go to the ER immediately if symptoms are severe.  You have done a great job in taking all your medications. Please continue to do this.  Please bring your medicines with you each time you come to clinic.  Medicines may include prescription medications, over-the-counter medications, herbal remedies, eye drops, vitamins, or other pills.   Progress Toward Treatment Goals:  Treatment Goal 04/09/2014  Hemoglobin A1C at goal    Self Care Goals & Plans:  Self Care Goal 04/09/2014  Manage my medications take my medicines as prescribed; bring my medications to every visit; refill my medications on time  Monitor my health -  Eat healthy foods drink diet soda or water instead of juice or soda; eat more vegetables; eat foods that are low in salt; eat baked foods instead of fried foods; eat fruit for snacks and desserts  Meeting treatment goals maintain the current self-care plan    Home Blood Glucose Monitoring 04/09/2014  Check my blood sugar no home glucose monitoring  When to check my blood sugar N/A     Care Management & Community Referrals:  Referral 04/09/2014  Referrals made for care management support none needed  Referrals made to community resources none

## 2014-04-09 NOTE — Progress Notes (Signed)
Subjective:   [name]   DOB: 1960/12/10 54 y.o.   MRN: [id number]  HPI: [name] is a 54 y.o. 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  The patient has had complaints of vaginal pruritis for quite some time and has been given Diflucan 150 mg x1 in the past w/ only short term relief. She is now post menopausal, denies any significant vaginal discharge that she can appreciate, no bleeding, or foul odor. She also denies any skin breakdown, dysuria, hematuria, or systemic symptoms.  Otherwise, she states she is doing quite well. She recently had a right and left heart cath given an abnormal stress test in the past, however, this study showed normal coronaries and improved right sided pressures. The patient has been quite active lately, still requiring home O2, but has been walking quite a bit back and forth to the bus, etc. She states her allergies have been semewhat bothersome, but otherwise says she feels fine. She continues to live with her son and says this living situation is fine for the time being.   Past Medical History  Diagnosis Date  . OSA (obstructive sleep apnea)     CPAP  . Venous stasis ulcer     chornic, ?followed up at wound care center, multiple courses of antibiotics in past for cellulitis, on lasix  . GERD (gastroesophageal reflux disease)   . Anemia, iron deficiency     secondary to menhorrhagia, on oral iron, also b12 def, getting monthly b12 shots  . Chronic cough     secondary to alleriges and post nasal drip  . Depression   . Diabetes mellitus     well controlled on metformin  . Hyperlipidemia   . H/O mental retardation   . Herpes   . Cor pulmonale     PA Peak pressure 51mHg  . Diastolic heart failure   . Restrictive lung  disease     PFTs 06/2012 (FVC 54% predicted and FEV1 53% predicted w minimal bronchodilator response).  . CHF (congestive heart failure)   . Renal disorder   . Pulmonary hypertension   . Hypertension   . Shortness of breath   . COPD (chronic obstructive pulmonary disease)   . Cognitive impairment    Current Outpatient Prescriptions  Medication Sig Dispense Refill  . ACCU-CHEK AVIVA PLUS test strip USE  STRIP TO CHECK GLUCOSE ONCE DAILY 100 each 11  . acetaminophen (TYLENOL) 500 MG tablet Take 1 tablet (500 mg total) by mouth every 6 (six) hours as needed for mild pain, moderate pain, fever or headache. 30 tablet 0  . cetirizine (ZYRTEC) 10 MG tablet TAKE ONE TABLET BY MOUTH ONCE DAILY 30 tablet 2  . cyanocobalamin 500 MCG tablet Take 500 mcg by mouth daily.    . diphenhydrAMINE (BENADRYL) 25 mg capsule Take 25 mg by mouth every 6 (six) hours as needed for itching.    . fluticasone (FLONASE) 50 MCG/ACT nasal spray Place 1 spray into both nostrils daily. (Patient taking differently: Place 1 spray into both nostrils daily as needed for allergies or rhinitis. ) 16 g 2  . gabapentin (NEURONTIN) 300 MG capsule Take 2 capsules (600 mg total) by mouth 3 (three) times daily. 180 capsule 5  . ketorolac (ACULAR) 0.4 % SOLN Place 1  drop into the left eye 4 (four) times daily.    Marland Kitchen KLOR-CON M20 20 MEQ tablet TAKE TWO TABLETS BY MOUTH THREE TIMES DAILY, TAKE EXTRA TABLET WHEN TAKING AN EXTRA TORESMIDE 180 tablet 5  . loperamide (IMODIUM) 2 MG capsule Take 2 mg by mouth daily as needed for diarrhea or loose stools.    . metFORMIN (GLUCOPHAGE) 1000 MG tablet Take 500 mg by mouth daily with breakfast.     . ofloxacin (OCUFLOX) 0.3 % ophthalmic solution Place 1 drop into both eyes 4 (four) times daily.    . pantoprazole (PROTONIX) 20 MG tablet TAKE ONE TABLET BY MOUTH TWICE DAILY 60 tablet 5  . pravastatin (PRAVACHOL) 20 MG tablet TAKE ONE TABLET BY MOUTH ONCE DAILY 30 tablet 5  . prednisoLONE acetate (PRED  FORTE) 1 % ophthalmic suspension Place 1 drop into both eyes 4 (four) times daily.    . sildenafil (REVATIO) 20 MG tablet Take 1 tablet (20 mg total) by mouth 3 (three) times daily. 270 tablet 3  . sodium chloride 0.9 % SOLN with treprostinil 1 MG/ML SOLN Inject 80 ng/kg/min into the vein continuous. Dose 80ng/kg/min based on dosing wt of 92kg. 29m/24hours    . spironolactone (ALDACTONE) 25 MG tablet Take 0.5 tablets (12.5 mg total) by mouth daily. 90 tablet 3  . torsemide (DEMADEX) 20 MG tablet Take 4 tablets (80 mg total) by mouth daily. Take extra tab as needed for weight gain 135 tablet 0  . XARELTO 20 MG TABS tablet TAKE ONE TABLET BY MOUTH ONCE DAILY WITH SUPPER 30 tablet 5   No current facility-administered medications for this visit.   Family History  Problem Relation Age of Onset  . Mental illness Sister   . Mental retardation Brother    History   Social History  . Marital Status: Single    Spouse Name: N/A  . Number of Children: N/A  . Years of Education: N/A   Social History Main Topics  . Smoking status: Never Smoker   . Smokeless tobacco: Never Used  . Alcohol Use: No  . Drug Use: No  . Sexual Activity: Not on file   Other Topics Concern  . None   Social History Narrative   Review of Systems  General: Denies fever, diaphoresis, appetite change, and fatigue.  Respiratory: Denies SOB, cough, and wheezing.   Cardiovascular: Denies chest pain and palpitations.  Gastrointestinal: Denies nausea, vomiting, abdominal pain, and diarrhea GU: Positive for vaginal pruritis.  Musculoskeletal: Denies myalgias, arthralgias, back pain, and gait problem.  Neurological: Denies dizziness, syncope, weakness, lightheadedness, and headaches.  Psychiatric/Behavioral: Denies mood changes, sleep disturbance, and agitation.   Objective:  Physical Exam: Filed Vitals:   04/09/14 1424  BP: 102/60  Pulse: 78  Temp: 97.9 F (36.6 C)  TempSrc: Oral  Height: 5' 3.5" (1.613 m)    Weight: 247 lb 1.6 oz (112.084 kg)  SpO2: 97%   General: Obese female, alert, cooperative, NAD. On 3L [place] HEENT: PERRL, EOMI. Moist mucus membranes Neck: Full range of motion without pain, supple, no lymphadenopathy or carotid bruits Lungs: Clear to ascultation bilaterally, normal work of respiration, no wheezes, rales, rhonchi Heart: RRR, no murmurs, gallops, or rubs Abdomen: Soft, non-tender, non-distended, BS + GU: On speculum examination, scant whitish discharge seen on vulva/labia. No vaginal discharge, obvious vaginal lesions, or dryness noted. Cervix identified, no obvious lesions. No cervical motion tenderness or pelvic pain.  Extremities: No cyanosis, clubbing, or edema Neurologic: Alert & oriented X3, cranial nerves II-XII  intact, strength grossly intact, sensation intact to light touch   Assessment & Plan:   Please see problem-based assessment and plan.

## 2014-04-10 NOTE — Assessment & Plan Note (Signed)
Had [facility] on 02/28/14, showed minimal nonobstructive CAD, marked improvement in [facility] with Flolan now with residual mild PAH, and normal LVEF with low left-sided filling pressures.  -Continue follow up w/ CHF clinic.  -Continue Spironolactone 12.5 mg daily, Torsemide 80 mg daily. May be able to discontinue Spirono given EF of 60%. BP low normal today.  -Sildenafil 20 mg tid

## 2014-04-10 NOTE — Assessment & Plan Note (Signed)
Lab Results  Component Value Date   HGBA1C 5.9 04/09/2014   HGBA1C 5.7 11/15/2013   HGBA1C 5.6 08/07/2013     Assessment: Diabetes control: good control (HgbA1C at goal) Progress toward A1C goal:  at goal Comments: DM well controlled currently. Slow upward trend in HbA1c noted, may make changes if this continues.   Plan: Medications:  continue current medications; Metformin 500 mg daily; may increase to 500 mg bid if HbA1c >6 at next clinic visit.  Home glucose monitoring: Frequency: no home glucose monitoring Timing: N/A Instruction/counseling given: discussed the need for weight loss Educational resources provided: brochure (denies) Self management tools provided:   Other plans: Patient seen by Dr. Herbert Deaner (optho) 3-4 days ago, according to the patient.

## 2014-04-10 NOTE — Assessment & Plan Note (Signed)
Patient given Fluconazole 150 mg once at last appointment for pruritis and suspected vaginal candidiasis. She claims this helped temporarily but then returned. She denies any significant discharge, bleeding, pain, dysuria, or systemic symptoms. Pelvic exam/pap smear performed today, showed small amount of whitish discharge on labia/vulva. No apparent vaginal lesions, vaginal discharge, redness, or apparent dryness. Given h/o candidal infections in the past, and whitish discharge on labia, suspect this is still a candidal infection. -Wet prep pending -Pap pending -Given Diflucan 150 mg q72h for 3 doses

## 2014-04-10 NOTE — Assessment & Plan Note (Signed)
Thyroid abnormality shown on stress test. Patient was supposed to go for Thyroid US, however this was never obtained. TSH on 11/15/13 was normal at 2.76.  -Obtain thyroid US

## 2014-04-11 LAB — CERVICOVAGINAL ANCILLARY ONLY: Wet Prep (BD Affirm): NEGATIVE

## 2014-04-11 LAB — CYTOLOGY - PAP

## 2014-04-11 NOTE — Progress Notes (Signed)
Internal Medicine Clinic Attending  Case discussed with Dr. Jones soon after the resident saw the patient.  We reviewed the resident's history and exam and pertinent patient test results.  I agree with the assessment, diagnosis, and plan of care documented in the resident's note. 

## 2014-04-15 ENCOUNTER — Other Ambulatory Visit (HOSPITAL_COMMUNITY): Payer: Self-pay | Admitting: *Deleted

## 2014-04-29 ENCOUNTER — Ambulatory Visit (HOSPITAL_COMMUNITY): Payer: Medicaid Other

## 2014-05-06 ENCOUNTER — Other Ambulatory Visit (HOSPITAL_COMMUNITY): Payer: Self-pay | Admitting: Internal Medicine

## 2014-05-07 ENCOUNTER — Ambulatory Visit (HOSPITAL_COMMUNITY)
Admission: RE | Admit: 2014-05-07 | Discharge: 2014-05-07 | Disposition: A | Discharge status: Home / Self Care | Payer: Medicaid Other | Source: Ambulatory Visit | Attending: Internal Medicine | Admitting: Internal Medicine

## 2014-05-07 DIAGNOSIS — R948 Abnormal results of function studies of other organs and systems: Secondary | ICD-10-CM | POA: Diagnosis present

## 2014-05-07 DIAGNOSIS — E041 Nontoxic single thyroid nodule: Secondary | ICD-10-CM | POA: Insufficient documentation

## 2014-05-07 DIAGNOSIS — Z1329 Encounter for screening for other suspected endocrine disorder: Secondary | ICD-10-CM

## 2014-05-31 ENCOUNTER — Encounter: Payer: Self-pay | Admitting: *Deleted

## 2014-06-09 ENCOUNTER — Other Ambulatory Visit (HOSPITAL_COMMUNITY): Payer: Self-pay | Admitting: Internal Medicine

## 2014-06-23 ENCOUNTER — Other Ambulatory Visit: Payer: Self-pay | Admitting: Internal Medicine

## 2014-07-01 ENCOUNTER — Ambulatory Visit (HOSPITAL_COMMUNITY)
Admission: RE | Admit: 2014-07-01 | Discharge: 2014-07-01 | Disposition: A | Discharge status: Home / Self Care | Payer: Medicaid Other | Source: Ambulatory Visit | Attending: Internal Medicine | Admitting: Internal Medicine

## 2014-07-01 VITALS — BP 100/52 | HR 99 | Wt 243.8 lb

## 2014-07-01 DIAGNOSIS — F329 Major depressive disorder, single episode, unspecified: Secondary | ICD-10-CM | POA: Diagnosis not present

## 2014-07-01 DIAGNOSIS — I2510 Atherosclerotic heart disease of native coronary artery without angina pectoris: Secondary | ICD-10-CM | POA: Insufficient documentation

## 2014-07-01 DIAGNOSIS — Z79899 Other long term (current) drug therapy: Secondary | ICD-10-CM | POA: Insufficient documentation

## 2014-07-01 DIAGNOSIS — I878 Other specified disorders of veins: Secondary | ICD-10-CM | POA: Insufficient documentation

## 2014-07-01 DIAGNOSIS — K219 Gastro-esophageal reflux disease without esophagitis: Secondary | ICD-10-CM | POA: Diagnosis not present

## 2014-07-01 DIAGNOSIS — I4892 Unspecified atrial flutter: Secondary | ICD-10-CM | POA: Diagnosis not present

## 2014-07-01 DIAGNOSIS — J449 Chronic obstructive pulmonary disease, unspecified: Secondary | ICD-10-CM | POA: Diagnosis not present

## 2014-07-01 DIAGNOSIS — I272 Other secondary pulmonary hypertension: Secondary | ICD-10-CM | POA: Insufficient documentation

## 2014-07-01 DIAGNOSIS — I270 Primary pulmonary hypertension: Secondary | ICD-10-CM | POA: Diagnosis not present

## 2014-07-01 DIAGNOSIS — E785 Hyperlipidemia, unspecified: Secondary | ICD-10-CM | POA: Insufficient documentation

## 2014-07-01 DIAGNOSIS — I5032 Chronic diastolic (congestive) heart failure: Secondary | ICD-10-CM | POA: Diagnosis not present

## 2014-07-01 DIAGNOSIS — G4733 Obstructive sleep apnea (adult) (pediatric): Secondary | ICD-10-CM | POA: Insufficient documentation

## 2014-07-01 DIAGNOSIS — J984 Other disorders of lung: Secondary | ICD-10-CM | POA: Insufficient documentation

## 2014-07-01 DIAGNOSIS — I10 Essential (primary) hypertension: Secondary | ICD-10-CM | POA: Insufficient documentation

## 2014-07-01 DIAGNOSIS — Z7902 Long term (current) use of antithrombotics/antiplatelets: Secondary | ICD-10-CM | POA: Diagnosis not present

## 2014-07-01 DIAGNOSIS — F79 Unspecified intellectual disabilities: Secondary | ICD-10-CM | POA: Insufficient documentation

## 2014-07-01 DIAGNOSIS — R079 Chest pain, unspecified: Secondary | ICD-10-CM | POA: Insufficient documentation

## 2014-07-01 DIAGNOSIS — I2781 Cor pulmonale (chronic): Secondary | ICD-10-CM | POA: Diagnosis not present

## 2014-07-01 LAB — BASIC METABOLIC PANEL
Anion gap: 13 (ref 5–15)
BUN: 19 mg/dL (ref 6–20)
CALCIUM: 9.1 mg/dL (ref 8.9–10.3)
CO2: 24 mmol/L (ref 22–32)
CREATININE: 1.08 mg/dL — AB (ref 0.44–1.00)
Chloride: 100 mmol/L — ABNORMAL LOW (ref 101–111)
GFR calc Af Amer: >60 mL/min (ref >60)
GFR, EST NON AFRICAN AMERICAN: 58 mL/min — AB (ref >60)
GLUCOSE: 141 mg/dL — AB (ref 65–99)
Potassium: 3.8 mmol/L (ref 3.5–5.1)
Sodium: 137 mmol/L (ref 135–145)

## 2014-07-01 LAB — BRAIN NATRIURETIC PEPTIDE: B Natriuretic Peptide: 11.8 pg/mL (ref 0.0–100.0)

## 2014-07-01 MED ORDER — TORSEMIDE 20 MG PO TABS: 60.0000 mg | ORAL_TABLET | Freq: Every day | ORAL | Status: DC

## 2014-07-01 NOTE — Patient Instructions (Signed)
Decrease Torsemide to 60 mg (3 tabs) daily  Labs today  We will contact you in 4 months to schedule your next appointment.

## 2014-07-01 NOTE — Progress Notes (Signed)
[name], female   DOB: 10-Jan-1960, 54 y.o.    [facility] Cardiologist: Dr [name]    HPI: [name] is a 54 y.o. 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   She was admitted to [facility] in July 2014 with respiratory failure and RHC showed severe PAH with pulmonary pressures in the 90s. She was transferred to [facility] where she was started on IV Remodulin with excellent results.  07/2012 On dopamine 7.5 mcg/kg/min  RA 20  PAP 95/49  PCWP 15  SVR 2067  PVR 16.6 [place]  CO/CI 2.9/1.38  She was admitted again 08/16/12 for CP and tachycardia, was found to be in atypical flutter vs atrial tach. Amiodarone was considered but she converted spontaneously. She was started on Xarelto. Her discharge weight was 215 lbs.     Currently on Revatio 20 tid and Remodulin at 26.   Had CP in 11/15 and Myoview with EF 78% with reversible defect in distal anterior wall and apex. (ischemiA vs breast attenuation) EF 78% Cath 2/16: Minimal CAD LAD 30% otherwise normal RHC on Remodulin RA = 8 RV = 45/5/8 PA = 52/24 (34) PCW = 11 Fick cardiac output/index = 7.6/3.6 PVR = 3.0 WU FA sat = 99% PA sat = 70%, 71%  She returns today for follow up. Says she is doing ok. She does c/o of occasional dizziness with standing, up to 3-4 times a week. Can walk anywhere she wants as long as she uses her oxygen and rolling walker. + stable DOE. Denies PND/Orthopnea/presyncope/palpitations. Wears 3-4 L O2 chronically. Uses CPAP nightly.  [facility] following. Her son performs hickman catheter dressing changes and mixes meds. Weight the same as last visit at ~244. Was 215 lbs in 8/14, Dr. Gilles Barker at [facility] has told her to watch her weight.  Says she likes desserts but is trying to  cut back. Says she has been getting mild nosebleeds on Xarelto, up to 3-4 times a week.  ECHO 08/11/12 EF 60-65% RV moderately dilated Peak PA pressure 48 mmhg  ECHO 02/28/13 EF 55-60% Grade I DD RV severely dilated and HK. Trivial TR. Septum flat Peak PA pressure 53 mmhg ECHO 11/15 EF 55-60% RV normal Trivial TR ECHO 1/16 at [facility] EF >55% RV mildly dilated (improved function) Trivial TR normal IVC  Labs        09/29/12: K+ 3.6, Creatinine 0.87       02/12/13 K 3.4 Creatinine 0.67 ---> given extra potassium and started on 12.5 mg spironolactone.         05/17/2013  K 4.3 Creatinine 0.68        11/15  K 3.4 Cr 0.67       2/16    K 4.1 Cr 0.78          ROS: All systems negative except as listed in HPI, PMH and Problem List.  Past Medical History  Diagnosis Date  . OSA (obstructive sleep apnea)     CPAP  . Venous stasis ulcer     chornic, ?followed up at wound care center, multiple courses of antibiotics in past for cellulitis, on lasix  . GERD (gastroesophageal reflux disease)   . Anemia, iron deficiency     secondary to menhorrhagia, on oral iron, also b12 def, getting  monthly b12 shots  . Chronic cough     secondary to alleriges and post nasal drip  . Depression   . Diabetes mellitus     well controlled on metformin  . Hyperlipidemia   . H/O mental retardation   . Herpes   . Cor pulmonale     PA Peak pressure 58mHg  . Diastolic heart failure   . Restrictive lung disease     PFTs 06/2012 (FVC 54% predicted and FEV1 68% predicted w minimal bronchodilator response).  . CHF (congestive heart failure)   . Renal disorder   . Pulmonary hypertension   . Hypertension   . Shortness of breath   . COPD (chronic obstructive pulmonary disease)   . Cognitive impairment     Current Outpatient Prescriptions  Medication Sig Dispense Refill  . ACCU-CHEK AVIVA PLUS test strip USE  STRIP TO CHECK GLUCOSE ONCE DAILY 100 each 11  . acetaminophen (TYLENOL) 500 MG tablet Take 1 tablet (500 mg  total) by mouth every 6 (six) hours as needed for mild pain, moderate pain, fever or headache. 30 tablet 0  . cetirizine (ZYRTEC) 10 MG tablet TAKE ONE TABLET BY MOUTH ONCE DAILY 30 tablet 2  . cyanocobalamin 500 MCG tablet Take 500 mcg by mouth daily.    . diphenhydrAMINE (BENADRYL) 25 mg capsule Take 25 mg by mouth every 6 (six) hours as needed for itching.    . fluticasone (FLONASE) 50 MCG/ACT nasal spray Place 1 spray into both nostrils daily. (Patient taking differently: Place 1 spray into both nostrils daily as needed for allergies or rhinitis. ) 16 g 2  . gabapentin (NEURONTIN) 300 MG capsule Take 2 capsules (600 mg total) by mouth 3 (three) times daily. 180 capsule 5  . KLOR-CON M20 20 MEQ tablet TAKE TWO TABLETS BY MOUTH THREE TIMES DAILY, TAKE EXTRA TABLET WHEN TAKING AN EXTRA TORESMIDE 180 tablet 5  . loperamide (IMODIUM) 2 MG capsule Take 2 mg by mouth daily as needed for diarrhea or loose stools.    . metFORMIN (GLUCOPHAGE) 1000 MG tablet TAKE ONE TABLET BY MOUTH ONCE DAILY WITH BREAKFAST 30 tablet 5  . pantoprazole (PROTONIX) 20 MG tablet TAKE ONE TABLET BY MOUTH TWICE DAILY 60 tablet 5  . pravastatin (PRAVACHOL) 20 MG tablet TAKE ONE TABLET BY MOUTH ONCE DAILY 30 tablet 5  . sildenafil (REVATIO) 20 MG tablet Take 1 tablet (20 mg total) by mouth 3 (three) times daily. 270 tablet 3  . sodium chloride 0.9 % SOLN with treprostinil 1 MG/ML SOLN Inject 80 ng/kg/min into the vein continuous. Dose 80ng/kg/min based on dosing wt of 92kg. 457m24hours    . spironolactone (ALDACTONE) 25 MG tablet Take 0.5 tablets (12.5 mg total) by mouth daily. 90 tablet 3  . torsemide (DEMADEX) 20 MG tablet TAKE FOUR TABLETS BY MOUTH ONCE DAILY(TAKE EXTRA TABLET AS NEEDED FOR WEIGHT GAIN) 135 tablet 0  . XARELTO 20 MG TABS tablet TAKE ONE TABLET BY MOUTH ONCE DAILY WITH SUPPER 30 tablet 5   No current facility-administered medications for this encounter.    Filed Vitals:   07/01/14 1358  BP: 100/52   Pulse: 99  Weight: 243 lb 12 oz (110.564 kg)  SpO2: 93%    Physical Exam:  General: Morbidly obese. On chronic 3 L O2 used cane to ambulate into clinic.  HEENT: normal  Neck: supple. JVP 6 Carotids 2+ bilat; no bruits. No lymphadenopathy or thryomegaly appreciated.  Cor: PMI nonpalpable. Mildly irregular 2/6 TR increased P2  Hickman catheter site OK Lungs: clear Abdomen: obese soft, nontender, nondistended. No hepatosplenomegaly. No bruits or masses. Good bowel sounds.  Extremities: no cyanosis, clubbing, rash, no edema; TED hose intact Neuro: alert & orientedx3, cranial nerves grossly intact. moves all 4 extremities w/o difficulty. Affect pleasant    ASSESSMENT & PLAN:  1) Chronic diastolic HF/cor pulmonale:  - Overall I think she looks pretty good. Echo with improved RV function - Looks and sounds dry symptomatically. - Decrease torsemide to 60 mg daily. -Continue current meds. - Reinforced the need and importance of daily weights, a low sodium diet, and fluid restriction (less than 2 L a day). Instructed to call the HF clinic if weight increases more than 3 lbs overnight or 5 lbs in a week.  - Check BMET/BNP - Encouraged weight loss.   2) PAH, severe with cor pulmonale.  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 However PAH far out of proportion to L-sided pressures or restrictive lung disease. - Overall stable. NYHA II. Doing well with sildenafill.  - Follows with [facility]. Next appt in July - Continue current regimen.   3) Paroxysmal atrial flutter.  In sinus. Continue Xarelto.  4) Chest pain with positive stress test - resolved - Cath with minimal CAD 2/16  Total time spent 45 minutes. Over half that time spent discussing above.   Follow up in 3 months.  Satira Mccallum Tillery,MD   2:06 PM   Patient seen and examined with Oda Kilts, PA-C. We discussed all aspects of the encounter. I agree with the assessment and plan as stated above.   She is doing  very well. Suspect she is on the dry side. Will decrease torsemide. Reinforced need for daily weights and reviewed use of sliding scale diuretics.Remians in NSR> Continue Xarelto. Recent RHC/LHC looked good. Continue current regimen. Will f/u at [facility] next month.   Oliver Heitzenrater,MD 1:23 AM

## 2014-07-04 ENCOUNTER — Other Ambulatory Visit: Payer: Self-pay | Admitting: Internal Medicine

## 2014-07-22 ENCOUNTER — Ambulatory Visit (INDEPENDENT_AMBULATORY_CARE_PROVIDER_SITE_OTHER): Payer: Medicaid Other | Admitting: Internal Medicine

## 2014-07-22 ENCOUNTER — Encounter: Payer: Self-pay | Admitting: Internal Medicine

## 2014-07-22 VITALS — BP 104/54 | HR 81 | Temp 98.5°F | Ht 63.5 in | Wt 251.0 lb

## 2014-07-22 DIAGNOSIS — M25562 Pain in left knee: Secondary | ICD-10-CM

## 2014-07-22 DIAGNOSIS — I5032 Chronic diastolic (congestive) heart failure: Secondary | ICD-10-CM

## 2014-07-22 DIAGNOSIS — E785 Hyperlipidemia, unspecified: Secondary | ICD-10-CM

## 2014-07-22 DIAGNOSIS — E119 Type 2 diabetes mellitus without complications: Secondary | ICD-10-CM

## 2014-07-22 DIAGNOSIS — D509 Iron deficiency anemia, unspecified: Secondary | ICD-10-CM

## 2014-07-22 DIAGNOSIS — Z6841 Body Mass Index (BMI) 40.0 and over, adult: Secondary | ICD-10-CM

## 2014-07-22 LAB — LIPID PANEL
Cholesterol: 159 mg/dL (ref 0–200)
HDL: 45 mg/dL — ABNORMAL LOW (ref >=46)
LDL Cholesterol: 90 mg/dL (ref 0–99)
Total CHOL/HDL Ratio: 3.5 Ratio
Triglycerides: 119 mg/dL (ref <150)
VLDL: 24 mg/dL (ref 0–40)

## 2014-07-22 LAB — POCT GLYCOSYLATED HEMOGLOBIN (HGB A1C): Hemoglobin A1C: 5.7

## 2014-07-22 LAB — IRON AND TIBC
%SAT: 4 % — AB (ref 20–55)
IRON: 16 ug/dL — AB (ref 42–145)
TIBC: 454 ug/dL (ref 250–470)
UIBC: 438 ug/dL — ABNORMAL HIGH (ref 125–400)

## 2014-07-22 LAB — GLUCOSE, CAPILLARY: Glucose-Capillary: 89 mg/dL (ref 65–99)

## 2014-07-22 LAB — FERRITIN: Ferritin: 12 ng/mL (ref 10–291)

## 2014-07-22 MED ORDER — PRAVASTATIN SODIUM 20 MG PO TABS
20.0000 mg | ORAL_TABLET | Freq: Every day | ORAL | Status: DC
Start: 2014-07-22 — End: 2014-10-04

## 2014-07-22 MED ORDER — TORSEMIDE 20 MG PO TABS: 60.0000 mg | ORAL_TABLET | Freq: Every day | ORAL | Status: DC

## 2014-07-22 NOTE — Patient Instructions (Signed)
1. Please schedule a follow up appointment for 3 months.   2. Please take all medications as previously prescribed.  3. If you have worsening of your symptoms or new symptoms arise, please call the clinic ([phone]), or go to the ER immediately if symptoms are severe.  You have done a great job in taking all your medications. Please continue to do this.

## 2014-07-22 NOTE — Progress Notes (Signed)
Subjective:   [name]   DOB: 1960/11/23 54 y.o.   MRN: [id number]  HPI: [name] is a 54 y.o. 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 [name] is doing exceptionally well today, states she is having very little issues w/ regards to her health. She does admit to some right knee pain that is worse w/ activity, which has been an issue for her previously. She denies any Am stiffness, fatigue, dizziness, or lightheadedness. She was recently seen at CHF clinic and her Torsemide dose was decreased at that time. On exam, she appears euvolemic today. Tolerating home O2, no further complaints.    Current Outpatient Prescriptions  Medication Sig Dispense Refill  . ACCU-CHEK AVIVA PLUS test strip USE  STRIP TO CHECK GLUCOSE ONCE DAILY 100 each 11  . acetaminophen (TYLENOL) 500 MG tablet Take 1 tablet (500 mg total) by mouth every 6 (six) hours as needed for mild pain, moderate pain, fever or headache. 30 tablet 0  . cetirizine (ZYRTEC) 10 MG tablet TAKE ONE TABLET BY MOUTH ONCE DAILY 30 tablet 1  . cyanocobalamin 500 MCG tablet Take 500 mcg by mouth daily.    . diphenhydrAMINE (BENADRYL) 25 mg capsule Take 25 mg by mouth every 6 (six) hours as needed for itching.    . fluticasone (FLONASE) 50 MCG/ACT nasal spray Place 1 spray into both nostrils daily. (Patient taking differently: Place 1 spray into both nostrils daily as needed for allergies or rhinitis. ) 16 g 2  . gabapentin (NEURONTIN) 300 MG capsule Take 2 capsules (600 mg total) by mouth 3 (three) times daily. 180 capsule 5  . KLOR-CON M20 20 MEQ tablet TAKE TWO TABLETS BY MOUTH THREE TIMES DAILY, TAKE EXTRA TABLET WHEN TAKING AN EXTRA TORESMIDE 180 tablet 5  . loperamide (IMODIUM) 2 MG capsule Take 2 mg by mouth daily as needed for diarrhea or loose stools.     . metFORMIN (GLUCOPHAGE) 1000 MG tablet TAKE ONE TABLET BY MOUTH ONCE DAILY WITH BREAKFAST 30 tablet 5  . pantoprazole (PROTONIX) 20 MG tablet TAKE ONE TABLET BY MOUTH TWICE DAILY 60 tablet 5  . pravastatin (PRAVACHOL) 20 MG tablet Take 1 tablet (20 mg total) by mouth daily. 30 tablet 5  . sildenafil (REVATIO) 20 MG tablet Take 1 tablet (20 mg total) by mouth 3 (three) times daily. 270 tablet 3  . sodium chloride 0.9 % SOLN with treprostinil 1 MG/ML SOLN Inject 80 ng/kg/min into the vein continuous. Dose 80ng/kg/min based on dosing wt of 92kg. 75m/24hours    . spironolactone (ALDACTONE) 25 MG tablet Take 0.5 tablets (12.5 mg total) by mouth daily. 90 tablet 3  . torsemide (DEMADEX) 20 MG tablet Take 3 tablets (60 mg total) by mouth daily. 90 tablet 2  . XARELTO 20 MG TABS tablet TAKE ONE TABLET BY MOUTH ONCE DAILY WITH SUPPER 30 tablet 5   No current facility-administered medications for this visit.   Family History  Problem Relation Age of Onset  . Mental illness Sister   . Mental retardation Brother    History   Social History  . Marital Status: Single    Spouse Name: N/A  . Number of Children: N/A  . Years of Education: N/A   Social History Main Topics  . Smoking status: Never Smoker   . Smokeless  tobacco: Never Used  . Alcohol Use: No  . Drug Use: No  . Sexual Activity: Not on file   Other Topics Concern  . None   Social History Narrative   Review of Systems  General: Denies fever, diaphoresis, appetite change, and fatigue.  Respiratory: Denies SOB, cough, and wheezing.   Cardiovascular: Denies chest pain and palpitations.  Gastrointestinal: Denies nausea, vomiting, abdominal pain, and diarrhea Musculoskeletal: Positive for right knee pain. Denies myalgias, back pain, and gait problem.  Neurological: Denies dizziness, syncope, weakness, lightheadedness, and headaches.  Psychiatric/Behavioral: Denies mood changes, sleep disturbance, and agitation.   Objective:    Physical Exam: Filed Vitals:   07/22/14 1431  BP: 104/54  Pulse: 81  Temp: 98.5 F (36.9 C)  TempSrc: Oral  Height: 5' 3.5" (1.613 m)  Weight: 251 lb (113.853 kg)  SpO2: 97%   General: Obese female, alert, cooperative, NAD. On 3L [place] HEENT: PERRL, EOMI. Moist mucus membranes Neck: Full range of motion without pain, supple, no lymphadenopathy or carotid bruits Lungs: Clear to ascultation bilaterally, normal work of respiration, no wheezes, rales, rhonchi Heart: RRR, no murmurs, gallops, or rubs Abdomen: Soft, non-tender, non-distended, BS + Extremities: No cyanosis, clubbing, or edema. RIght knee w/out tenderness or limitations in ROM.  Neurologic: Alert & oriented x3, cranial nerves II-XII intact, strength grossly intact, sensation intact to light touch   Assessment & Plan:   Please see problem-based assessment and plan.

## 2014-07-23 DIAGNOSIS — M25562 Pain in left knee: Secondary | ICD-10-CM | POA: Insufficient documentation

## 2014-07-23 LAB — CBC WITH DIFFERENTIAL/PLATELET
BASOS ABS: 0 10*3/uL (ref 0.0–0.1)
BASOS PCT: 0 % (ref 0–1)
EOS ABS: 0.1 10*3/uL (ref 0.0–0.7)
Eosinophils Relative: 1 % (ref 0–5)
HCT: 29.9 % — ABNORMAL LOW (ref 36.0–46.0)
Hemoglobin: 8.8 g/dL — ABNORMAL LOW (ref 12.0–15.0)
LYMPHS ABS: 2.1 10*3/uL (ref 0.7–4.0)
Lymphocytes Relative: 24 % (ref 12–46)
MCH: 19.7 pg — ABNORMAL LOW (ref 26.0–34.0)
MCHC: 29.4 g/dL — ABNORMAL LOW (ref 30.0–36.0)
MCV: 66.9 fL — ABNORMAL LOW (ref 78.0–100.0)
MPV: 7.7 fL — AB (ref 8.6–12.4)
Monocytes Absolute: 0.9 10*3/uL (ref 0.1–1.0)
Monocytes Relative: 10 % (ref 3–12)
NEUTROS PCT: 65 % (ref 43–77)
Neutro Abs: 5.7 10*3/uL (ref 1.7–7.7)
PLATELETS: 559 10*3/uL — AB (ref 150–400)
RBC: 4.47 MIL/uL (ref 3.87–5.11)
RDW: 20.4 % — AB (ref 11.5–15.5)
WBC: 8.7 10*3/uL (ref 4.0–10.5)

## 2014-07-23 MED ORDER — FERROUS SULFATE 325 (65 FE) MG PO TABS: 325.0000 mg | ORAL_TABLET | Freq: Every day | ORAL | Status: DC

## 2014-07-23 MED ORDER — DOCUSATE SODIUM 100 MG PO CAPS: 100.0000 mg | ORAL_CAPSULE | Freq: Two times a day (BID) | ORAL | Status: DC | PRN

## 2014-07-23 NOTE — Assessment & Plan Note (Signed)
Patient w/ decreasing Hb, LOW MCV, and low ferritin and iron suggestive of iron deficiency anemia. Unclear why this is, patient denies any bleeding, colonoscopy from 2013 was completely normal.  -Start Ferrous sulfate 325 mg tid + colace prn for constipation.  -Will have patient return to clinic if symptoms of weakness, fatigue, lightheadedness, or bleeding.  -RTC in 6-8 weeks.

## 2014-07-23 NOTE — Assessment & Plan Note (Addendum)
Lab Results  Component Value Date   HGBA1C 5.7 07/22/2014   HGBA1C 5.9 04/09/2014   HGBA1C 5.7 11/15/2013     Assessment: Diabetes control:  Well controlled.  Comments: No issues. Continues  Plan: Medications:  continue current medications Instruction/counseling given: discussed the need for weight loss Educational resources provided:   Self management tools provided: copy of home glucose meter download Other plans: Repeat NEXT HbA1c in 6 months

## 2014-07-23 NOTE — Assessment & Plan Note (Signed)
Most likely 2/2 OA based on her symptoms. Says pain is worse after ambulation. No significant findings on clinical exam.  -Instructed using tylenol prn -Rest, ice, elevation at night -Can consider XR if continues to cause discomfort.

## 2014-07-29 ENCOUNTER — Other Ambulatory Visit: Payer: Self-pay | Admitting: Internal Medicine

## 2014-07-29 DIAGNOSIS — I5032 Chronic diastolic (congestive) heart failure: Secondary | ICD-10-CM

## 2014-07-29 NOTE — Progress Notes (Signed)
Internal Medicine Clinic Attending  Case discussed with Dr. Ronnald Ramp soon after the resident saw the patient.  We reviewed the resident's history and exam and pertinent patient test results.  I agree with the assessment, diagnosis, and plan of care documented in the resident's note.

## 2014-08-05 ENCOUNTER — Other Ambulatory Visit (HOSPITAL_COMMUNITY): Payer: Self-pay | Admitting: Internal Medicine

## 2014-08-20 ENCOUNTER — Encounter: Payer: Self-pay | Admitting: *Deleted

## 2014-09-10 ENCOUNTER — Encounter: Payer: Self-pay | Admitting: Internal Medicine

## 2014-09-22 ENCOUNTER — Other Ambulatory Visit: Payer: Self-pay | Admitting: Internal Medicine

## 2014-09-24 ENCOUNTER — Ambulatory Visit (HOSPITAL_COMMUNITY)
Admission: RE | Admit: 2014-09-24 | Discharge: 2014-09-24 | Disposition: A | Discharge status: Home / Self Care | Payer: Medicaid Other | Source: Ambulatory Visit | Attending: Internal Medicine | Admitting: Internal Medicine

## 2014-09-24 ENCOUNTER — Ambulatory Visit (INDEPENDENT_AMBULATORY_CARE_PROVIDER_SITE_OTHER): Payer: Medicaid Other | Admitting: Internal Medicine

## 2014-09-24 ENCOUNTER — Encounter: Payer: Self-pay | Admitting: Internal Medicine

## 2014-09-24 VITALS — BP 117/60 | HR 94 | Temp 97.9°F | Ht 63.5 in | Wt 254.5 lb

## 2014-09-24 DIAGNOSIS — E119 Type 2 diabetes mellitus without complications: Secondary | ICD-10-CM

## 2014-09-24 DIAGNOSIS — D509 Iron deficiency anemia, unspecified: Secondary | ICD-10-CM

## 2014-09-24 DIAGNOSIS — R531 Weakness: Secondary | ICD-10-CM | POA: Diagnosis not present

## 2014-09-24 DIAGNOSIS — R202 Paresthesia of skin: Secondary | ICD-10-CM | POA: Insufficient documentation

## 2014-09-24 DIAGNOSIS — M47896 Other spondylosis, lumbar region: Secondary | ICD-10-CM | POA: Insufficient documentation

## 2014-09-24 DIAGNOSIS — Z23 Encounter for immunization: Secondary | ICD-10-CM

## 2014-09-24 DIAGNOSIS — Z0000 Encounter for general adult medical examination without abnormal findings: Secondary | ICD-10-CM

## 2014-09-24 DIAGNOSIS — M545 Low back pain: Secondary | ICD-10-CM | POA: Diagnosis not present

## 2014-09-24 LAB — GLUCOSE, CAPILLARY: GLUCOSE-CAPILLARY: 143 mg/dL — AB (ref 65–99)

## 2014-09-24 NOTE — Patient Instructions (Signed)
1. Please return in 3 months unless you feel you need to return sooner.   2. Please take all medications as previously prescribed with the following changes:  ONLY take stool softener if you NEED it for constipation.   I WILL CALL YOU IF YOUR SPINE XRAY IS ABNORMAL.   3. If you have worsening of your symptoms or new symptoms arise, please call the clinic ([phone]), or go to the ER immediately if symptoms are severe.  You have done a great job in taking all your medications. Please continue to do this.

## 2014-09-25 LAB — CBC
Hematocrit: 39.2 % (ref 34.0–46.6)
Hemoglobin: 12.2 g/dL (ref 11.1–15.9)
MCH: 23.3 pg — AB (ref 26.6–33.0)
MCHC: 31.1 g/dL — ABNORMAL LOW (ref 31.5–35.7)
MCV: 75 fL — ABNORMAL LOW (ref 79–97)
PLATELETS: 617 10*3/uL — AB (ref 150–379)
RBC: 5.24 x10E6/uL (ref 3.77–5.28)
RDW: 26 % — ABNORMAL HIGH (ref 12.3–15.4)
WBC: 8.9 10*3/uL (ref 3.4–10.8)

## 2014-09-27 DIAGNOSIS — G629 Polyneuropathy, unspecified: Secondary | ICD-10-CM | POA: Insufficient documentation

## 2014-09-27 DIAGNOSIS — Z0000 Encounter for general adult medical examination without abnormal findings: Secondary | ICD-10-CM | POA: Insufficient documentation

## 2014-09-27 NOTE — Progress Notes (Signed)
Internal Medicine Clinic Attending  Case discussed with Dr. Jones at the time of the visit.  We reviewed the resident's history and exam and pertinent patient test results.  I agree with the assessment, diagnosis, and plan of care documented in the resident's note.  

## 2014-09-27 NOTE — Assessment & Plan Note (Signed)
Patient describes intermittent stocking distribution neuropathic type pain. 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 I suspect this is exacerbated by sitting too long w/ pressure on posterior thigh as she notes it is the worst when she is sitting on the toilet. She does describe some intermittent shooting pains originating in the spine and has not had lumbar spinal imaging in the past. Straight leg raise negative. No lumbar spinal tenderness on exam.  -XR lumbar spine; only mild degenerative changes -Continue neurontin; if this continues to be a worsening issue, can increase the dose at next visit.  -Discussed that sitting position is likely exacerbating pre-existing condition, patient understands.

## 2014-09-27 NOTE — Assessment & Plan Note (Signed)
Iron deficiency anemia, has significantly improved since last visit. CBC w/ Hb of 12.2, MCV increased to 75.  -Continue Ferrous Sulfate 325 mg tid for now as patient is tolerating this well. Can likely decrease to once daily in the near future -Discussed taking stool softener only as needed

## 2014-09-27 NOTE — Progress Notes (Signed)
Subjective:   [name]   DOB: 06/14/1960 54 y.o.   MRN: [id number]  HPI: [name] is a 54 y.o. 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 Patient previously w/ low Hb, 2/2 iron deficiency, started on po iron at last visit. Has been tolerating this well, however, has been taking stool softener daily, which has been resulting in loose stools.  Her one complaint today was peripheral neuropathy. She states she will get numbness and tingling in her feet and extends into her thighs. She denies any significant lower back pain but does admit to some shooting pain originating in the spine. She states the paraesthesias seem to be worse when she is sitting, especially on the toilet. No weakness noted.   Current Outpatient Prescriptions  Medication Sig Dispense Refill  . ACCU-CHEK AVIVA PLUS test strip USE  STRIP TO CHECK GLUCOSE ONCE DAILY 100 each 11  . acetaminophen (TYLENOL) 500 MG tablet Take 1 tablet (500 mg total) by mouth every 6 (six) hours as needed for mild pain, moderate pain, fever or headache. 30 tablet 0  . cetirizine (ZYRTEC) 10 MG tablet TAKE ONE TABLET BY MOUTH ONCE DAILY 30 tablet 1  . cyanocobalamin 500 MCG tablet Take 500 mcg by mouth daily.    . diphenhydrAMINE (BENADRYL) 25 mg capsule Take 25 mg by mouth every 6 (six) hours as needed for itching.    . docusate sodium (COLACE) 100 MG capsule Take 1 capsule (100 mg total) by mouth 2 (two) times daily as needed for mild constipation. 60 capsule 3  . ferrous sulfate 325 (65 FE) MG tablet Take 1 tablet (325 mg total) by mouth daily with breakfast. 90 tablet 3  . gabapentin (NEURONTIN) 300 MG capsule Take 2 capsules (600 mg total) by mouth 3 (three) times daily. 180 capsule 5  . KLOR-CON M20 20 MEQ tablet TAKE TWO TABLETS BY MOUTH THREE TIMES DAILY, TAKE  EXTRA TABLET WHEN TAKING AN EXTRA TORESMIDE 180 tablet 5  . loperamide (IMODIUM) 2 MG capsule Take 2 mg by mouth daily as needed for diarrhea or loose stools.    . metFORMIN (GLUCOPHAGE) 1000 MG tablet TAKE ONE TABLET BY MOUTH ONCE DAILY WITH BREAKFAST 30 tablet 5  . pantoprazole (PROTONIX) 20 MG tablet TAKE ONE TABLET BY MOUTH TWICE DAILY 60 tablet 3  . pravastatin (PRAVACHOL) 20 MG tablet Take 1 tablet (20 mg total) by mouth daily. 30 tablet 5  . sildenafil (REVATIO) 20 MG tablet Take 1 tablet (20 mg total) by mouth 3 (three) times daily. 270 tablet 3  . sodium chloride 0.9 % SOLN with treprostinil 1 MG/ML SOLN Inject 80 ng/kg/min into the vein continuous. Dose 80ng/kg/min based on dosing wt of 92kg. 53m/24hours    . spironolactone (ALDACTONE) 25 MG tablet TAKE ONE-HALF TABLET BY MOUTH ONCE DAILY 90 tablet 0  . torsemide (DEMADEX) 20 MG tablet Take 3 tablets (60 mg total) by mouth daily. 90 tablet 2  . XARELTO 20 MG TABS tablet TAKE ONE TABLET BY MOUTH ONCE DAILY WITH SUPPER 30 tablet 5   No current facility-administered medications for this visit.    Review of Systems  General: Denies fever, diaphoresis, appetite change, and fatigue.  Respiratory: Denies SOB, cough, and wheezing.   Cardiovascular: Denies chest pain and palpitations.  Gastrointestinal: Denies nausea, vomiting, abdominal pain, and diarrhea  Musculoskeletal: Positive for LE paraesthesias. Denies myalgias, back pain, and gait problem.  Neurological: Denies dizziness, syncope, weakness, lightheadedness, and headaches.  Psychiatric/Behavioral: Denies mood changes, sleep disturbance, and agitation.   Objective:   Physical Exam: Filed Vitals:   09/24/14 1529  BP: 117/60  Pulse: 94  Temp: 97.9 F (36.6 C)  TempSrc: Oral  Height: 5' 3.5" (1.613 m)  Weight: 254 lb 8 oz (115.44 kg)  SpO2: 97%   General: Obese female, alert, cooperative, NAD. On 3L [place] HEENT: PERRL, EOMI. Moist mucus membranes Neck: Full range of motion  without pain, supple, no lymphadenopathy or carotid bruits Lungs: Clear to ascultation bilaterally, normal work of respiration, no wheezes, rales, rhonchi Heart: RRR, no murmurs, gallops, or rubs Abdomen: Soft, non-tender, non-distended, BS + Extremities: No cyanosis, clubbing, or edema. Strength/sensation intact in LE's.  Neurologic: Alert & oriented x3, cranial nerves II-XII intact, strength grossly intact, sensation intact to light touch   Assessment & Plan:   Please see problem-based assessment and plan.

## 2014-09-27 NOTE — Assessment & Plan Note (Signed)
Flu shot given today

## 2014-10-03 ENCOUNTER — Other Ambulatory Visit: Payer: Self-pay | Admitting: Internal Medicine

## 2014-10-04 ENCOUNTER — Telehealth: Payer: Self-pay | Admitting: *Deleted

## 2014-10-04 ENCOUNTER — Other Ambulatory Visit: Payer: Self-pay | Admitting: *Deleted

## 2014-10-04 DIAGNOSIS — E785 Hyperlipidemia, unspecified: Secondary | ICD-10-CM

## 2014-10-04 DIAGNOSIS — I5032 Chronic diastolic (congestive) heart failure: Secondary | ICD-10-CM

## 2014-10-04 NOTE — Telephone Encounter (Signed)
Pt presents to clinic and is worried about her results, she was reassured

## 2014-10-04 NOTE — Telephone Encounter (Signed)
Please call pt with results of radiology studies, she is worried, tried to reassure her

## 2014-10-05 MED ORDER — POTASSIUM CHLORIDE CRYS ER 20 MEQ PO TBCR: EXTENDED_RELEASE_TABLET | ORAL | Status: DC

## 2014-10-05 MED ORDER — PRAVASTATIN SODIUM 20 MG PO TABS: 20.0000 mg | ORAL_TABLET | Freq: Every day | ORAL | Status: DC

## 2014-10-05 MED ORDER — METFORMIN HCL 1000 MG PO TABS: ORAL_TABLET | ORAL | Status: DC

## 2014-10-05 MED ORDER — PANTOPRAZOLE SODIUM 20 MG PO TBEC: 20.0000 mg | DELAYED_RELEASE_TABLET | Freq: Two times a day (BID) | ORAL | Status: DC

## 2014-10-05 MED ORDER — TORSEMIDE 20 MG PO TABS: 60.0000 mg | ORAL_TABLET | Freq: Every day | ORAL | Status: DC

## 2014-10-05 MED ORDER — SPIRONOLACTONE 25 MG PO TABS: 12.5000 mg | ORAL_TABLET | Freq: Every day | ORAL | Status: DC

## 2014-10-08 ENCOUNTER — Other Ambulatory Visit (HOSPITAL_COMMUNITY): Payer: Self-pay | Admitting: *Deleted

## 2014-10-08 DIAGNOSIS — I270 Primary pulmonary hypertension: Secondary | ICD-10-CM

## 2014-10-08 MED ORDER — SILDENAFIL CITRATE 20 MG PO TABS: 20.0000 mg | ORAL_TABLET | Freq: Three times a day (TID) | ORAL | Status: DC

## 2014-10-23 ENCOUNTER — Other Ambulatory Visit: Payer: Self-pay | Admitting: Internal Medicine

## 2014-10-31 ENCOUNTER — Encounter (HOSPITAL_COMMUNITY): Payer: Self-pay | Admitting: Internal Medicine

## 2014-10-31 ENCOUNTER — Ambulatory Visit (HOSPITAL_COMMUNITY)
Admission: RE | Admit: 2014-10-31 | Discharge: 2014-10-31 | Disposition: A | Discharge status: Home / Self Care | Payer: Medicaid Other | Source: Ambulatory Visit | Attending: Internal Medicine | Admitting: Internal Medicine

## 2014-10-31 VITALS — BP 108/66 | HR 90 | Wt 257.2 lb

## 2014-10-31 DIAGNOSIS — K219 Gastro-esophageal reflux disease without esophagitis: Secondary | ICD-10-CM | POA: Insufficient documentation

## 2014-10-31 DIAGNOSIS — R42 Dizziness and giddiness: Secondary | ICD-10-CM | POA: Diagnosis not present

## 2014-10-31 DIAGNOSIS — I5032 Chronic diastolic (congestive) heart failure: Secondary | ICD-10-CM

## 2014-10-31 DIAGNOSIS — I878 Other specified disorders of veins: Secondary | ICD-10-CM | POA: Insufficient documentation

## 2014-10-31 DIAGNOSIS — Z79899 Other long term (current) drug therapy: Secondary | ICD-10-CM | POA: Insufficient documentation

## 2014-10-31 DIAGNOSIS — E785 Hyperlipidemia, unspecified: Secondary | ICD-10-CM | POA: Insufficient documentation

## 2014-10-31 DIAGNOSIS — I272 Other secondary pulmonary hypertension: Secondary | ICD-10-CM

## 2014-10-31 DIAGNOSIS — Z7902 Long term (current) use of antithrombotics/antiplatelets: Secondary | ICD-10-CM | POA: Insufficient documentation

## 2014-10-31 DIAGNOSIS — G4733 Obstructive sleep apnea (adult) (pediatric): Secondary | ICD-10-CM | POA: Diagnosis not present

## 2014-10-31 DIAGNOSIS — I483 Typical atrial flutter: Secondary | ICD-10-CM | POA: Diagnosis not present

## 2014-10-31 DIAGNOSIS — I4892 Unspecified atrial flutter: Secondary | ICD-10-CM | POA: Insufficient documentation

## 2014-10-31 DIAGNOSIS — E119 Type 2 diabetes mellitus without complications: Secondary | ICD-10-CM | POA: Insufficient documentation

## 2014-10-31 DIAGNOSIS — F79 Unspecified intellectual disabilities: Secondary | ICD-10-CM | POA: Diagnosis not present

## 2014-10-31 DIAGNOSIS — J449 Chronic obstructive pulmonary disease, unspecified: Secondary | ICD-10-CM | POA: Diagnosis not present

## 2014-10-31 DIAGNOSIS — Z9981 Dependence on supplemental oxygen: Secondary | ICD-10-CM | POA: Diagnosis not present

## 2014-10-31 DIAGNOSIS — J984 Other disorders of lung: Secondary | ICD-10-CM | POA: Diagnosis not present

## 2014-10-31 DIAGNOSIS — I110 Hypertensive heart disease with heart failure: Secondary | ICD-10-CM | POA: Diagnosis present

## 2014-10-31 DIAGNOSIS — Z7984 Long term (current) use of oral hypoglycemic drugs: Secondary | ICD-10-CM | POA: Insufficient documentation

## 2014-10-31 DIAGNOSIS — I2781 Cor pulmonale (chronic): Secondary | ICD-10-CM | POA: Diagnosis not present

## 2014-10-31 DIAGNOSIS — R21 Rash and other nonspecific skin eruption: Secondary | ICD-10-CM | POA: Diagnosis not present

## 2014-10-31 NOTE — Patient Instructions (Signed)
Your physician has requested that you have an echocardiogram. Echocardiography is a painless test that uses sound waves to create images of your heart. It provides your doctor with information about the size and shape of your heart and how well your heart's chambers and valves are working. This procedure takes approximately one hour. There are no restrictions for this procedure.  You have been referred to Dermatology  We will contact you in 4 months to schedule your next appointment.

## 2014-10-31 NOTE — Progress Notes (Signed)
[name], female   DOB: 09/01/1960, 54 y.o.    [facility] Cardiologist: Dr Kelby Aline    HPI: [name] is a 54 y.o. 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   She was admitted to [facility] in July 2014 with respiratory failure and RHC showed severe PAH with pulmonary pressures in the 90s. She was transferred to [facility] where she was started on IV Remodulin with excellent results.  She was admitted again 08/16/12 for CP and tachycardia, was found to be in atypical flutter vs atrial tach. Amiodarone was considered but she converted spontaneously. She was started on Xarelto. Her discharge weight was 215 lbs.     Had CP in 11/15 and Myoview with EF 78% with reversible defect in distal anterior wall and apex. (ischemia vs breast attenuation) EF 78% Cath 2/16: Minimal CAD LAD 30% otherwise normal RHC on Remodulin RA = 8 RV = 45/5/8 PA = 52/24 (34) PCW = 11 Fick cardiac output/index = 7.6/3.6 PVR = 3.0 WU FA sat = 99% PA sat = 70%, 71%  She returns today for follow up.  Currently on Revatio 20 tid and Remodulin at 19. At her last visit, we decreased torsemide to 32m daily. She states her son is worried that she is gaining fluid as she has a distended abdomen. She's been having difficulty monitoring sodium intake (didn't realize she was supposed to look at serving size when calculating intake). She does c/o of occasional dizziness with standing, up to 3-4 times a week. Can walk anywhere she wants as long as she uses her oxygen and rolling walker. Stable DOE. Denies PND, Orthopnea, presyncope, palpitations. Wears 3-4 L O2 chronically but can go a few hours a day without O2 when at rest. Uses CPAP nightly.  [facility] following. Her son performs  hickman catheter dressing changes and mixes meds. Weights at home labile from 246-254lbs.  Dry weight appears like it should be ~244 however patient continues to gain weight. No problems with bleeding on Xarelto.  ECHO 08/11/12 EF 60-65% RV moderately dilated Peak PA pressure 48 mmhg  ECHO 02/28/13 EF 55-60% Grade I DD RV severely dilated and HK. Trivial TR. Septum flat Peak PA pressure 53 mmhg ECHO 11/15 EF 55-60% RV normal Trivial [facility] >55% RV mildly dilated (improved function) Trivial TR normal IVC  Labs        09/29/12: K+ 3.6, Creatinine 0.87       02/12/13 K 3.4 Creatinine 0.67 ---> given extra potassium and started on 12.5 mg spironolactone.         05/17/2013  K 4.3 Creatinine 0.68        11/15  K 3.4 Cr 0.67       2/16    K 4.1 Cr 0.78       9/16:   K 3.8 Cr 1.08             ROS: All systems negative except as listed in HPI, PMH and Problem List.  Past Medical History  Diagnosis Date  . OSA (obstructive sleep apnea)     CPAP  . [place] stasis [place]     chornic, ?followed up at wound care center, multiple courses of antibiotics in past for cellulitis, on  lasix  . GERD (gastroesophageal reflux disease)   . Anemia, iron deficiency     secondary to menhorrhagia, on oral iron, also b12 def, getting monthly b12 shots  . Chronic cough     secondary to alleriges and post nasal drip  . Depression   . Diabetes mellitus     well controlled on metformin  . Hyperlipidemia   . H/O mental retardation   . Herpes   . Cor pulmonale (HCC)     PA Peak pressure 33mHg  . Diastolic heart failure ([place]   . Restrictive lung disease     PFTs 06/2012 (FVC 54% predicted and FEV1 68% predicted w minimal bronchodilator response).  . CHF (congestive heart failure) ([place]   . Renal disorder   . Pulmonary hypertension ([place]   . Hypertension   . Shortness of breath   . COPD (chronic obstructive pulmonary disease) ([place]   . Cognitive impairment     Current Outpatient  Prescriptions  Medication Sig Dispense Refill  . ACCU-CHEK AVIVA PLUS test strip USE  STRIP TO CHECK GLUCOSE ONCE DAILY 100 each 11  . acetaminophen (TYLENOL) 500 MG tablet Take 1 tablet (500 mg total) by mouth every 6 (six) hours as needed for mild pain, moderate pain, fever or headache. 30 tablet 0  . cetirizine (ZYRTEC) 10 MG tablet TAKE ONE TABLET BY MOUTH ONCE DAILY 30 tablet 1  . cyanocobalamin 500 MCG tablet Take 500 mcg by mouth daily.    . diphenhydrAMINE (BENADRYL) 25 mg capsule Take 25 mg by mouth every 6 (six) hours as needed for itching.    . docusate sodium (COLACE) 100 MG capsule Take 1 capsule (100 mg total) by mouth 2 (two) times daily as needed for mild constipation. 60 capsule 3  . ferrous sulfate 325 (65 FE) MG tablet Take 1 tablet (325 mg total) by mouth daily with breakfast. 90 tablet 3  . gabapentin (NEURONTIN) 300 MG capsule Take 2 capsules (600 mg total) by mouth 3 (three) times daily. 180 capsule 5  . metFORMIN (GLUCOPHAGE) 1000 MG tablet Please take 1/2 tablet daily with breakfast 45 tablet 3  . pantoprazole (PROTONIX) 20 MG tablet Take 1 tablet (20 mg total) by mouth 2 (two) times daily. 180 tablet 3  . potassium chloride SA (KLOR-CON M20) 20 MEQ tablet TAKE TWO TABLETS BY MOUTH THREE TIMES DAILY, TAKE EXTRA TABLET WHEN TAKING AN EXTRA TORESMIDE 180 tablet 5  . pravastatin (PRAVACHOL) 20 MG tablet Take 1 tablet (20 mg total) by mouth daily. 90 tablet 3  . sildenafil (REVATIO) 20 MG tablet Take 1 tablet (20 mg total) by mouth 3 (three) times daily. 270 tablet 3  . sodium chloride 0.9 % SOLN with treprostinil 1 MG/ML SOLN Inject 80 ng/kg/min into the vein continuous. Dose 80ng/kg/min based on dosing wt of 92kg. 413m24hours    . spironolactone (ALDACTONE) 25 MG tablet Take 0.5 tablets (12.5 mg total) by mouth daily. 45 tablet 3  . torsemide (DEMADEX) 20 MG tablet TAKE THREE TABLETS BY MOUTH ONCE DAILY 90 tablet 5  . XARELTO 20 MG TABS tablet TAKE ONE TABLET BY MOUTH ONCE  DAILY WITH  SUPPER 30 tablet 5  . loperamide (IMODIUM) 2 MG capsule Take 2 mg by mouth daily as needed for diarrhea or loose stools.     No current facility-administered medications for this encounter.    Filed Vitals:   10/31/14 1417  BP: 108/66  Pulse: 90  Weight: 257 lb 4 oz (116.688 kg)  SpO2:  98%    Physical Exam:  General: Morbidly obese. On chronic 3 L O2 used rolling walker to ambulate into clinic. Flushing of her face.  HEENT: normal x for flushing Neck: supple. JVP hard to see doesn't look elevated  Carotids 2+ bilat; no bruits. No lymphadenopathy or thryomegaly appreciated.  Cor: PMI nonpalpable.Regular rate. I/VI TR, increased P2.  Hickman catheter site non-erythematous without drainage.  Lungs: clear to ascultation with occasional expiratory wheeze.  Abdomen: obese soft, nontender, nondistended. No hepatosplenomegaly. No bruits or masses. Good bowel sounds.  Extremities: no cyanosis, clubbing, erythematous maculopapular rash over the LE bilaterally.  Neuro: alert & orientedx3, cranial nerves grossly intact. moves all 4 extremities w/o difficulty. Affect pleasant    ASSESSMENT & PLAN:  1) Chronic diastolic HF/cor pulmonale:  - Patient is up 14 pounds since her last heart failure appt. (is up 3lbs in the last month) however appears euvolemic on exam.  - Echo with improved RV function - Continue torsemide to 60 mg daily. Can take extra as needed. - Continue current meds. - Reinforced the need and importance of daily weights, a low sodium diet, and fluid restriction (less than 2 L a day). Instructed to call the HF clinic if weight increases more than 3 lbs overnight or 5 lbs in a week.  - Referral to nutritionist for assistance with sodium intake and continued weight gain.   - Encouraged weight loss.   2) PAH, severe with cor pulmonale.  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 However PAH far out of proportion to L-sided pressures or restrictive lung disease. -  Overall stable. NYHA II. Last echo improved.  Doing well with sildenafill and Remodulin  - Follows with [facility]. Next appt November 19, 2014 (missed her July appt) - Continue current regimen.   3) Paroxysmal atrial flutter.  In sinus currently. Continue Xarelto.  4) Rash  -- referral to dermatology for concerns for leukocytoclastic vasculitis  Follow up in 3 months.  Crystal Dorsey,MD   2:27 PM   Patient seen and examined with Kathrine Cords, MD. We discussed all aspects of the encounter. I agree with the assessment and plan as stated above.   She is doing well. Volume status looks of despite weight gain. Suspect she is eating too much. Will refer to nutrition. Repeat echo for PAH - RV improved on last echo. Rash concerning for possible leukocytoclastic vasculitis. Will refer to dermatology. Maintaining NSR. Continue Xarelto. Has f/u in [facility] clinic in 2 weeks.   Woodie Degraffenreid,MD 3:02 PM

## 2014-11-06 ENCOUNTER — Other Ambulatory Visit: Payer: Self-pay | Admitting: Internal Medicine

## 2014-11-07 ENCOUNTER — Ambulatory Visit (HOSPITAL_COMMUNITY): Payer: Medicaid Other | Attending: Internal Medicine

## 2014-11-07 ENCOUNTER — Other Ambulatory Visit: Payer: Self-pay

## 2014-11-07 DIAGNOSIS — E785 Hyperlipidemia, unspecified: Secondary | ICD-10-CM | POA: Insufficient documentation

## 2014-11-07 DIAGNOSIS — E119 Type 2 diabetes mellitus without complications: Secondary | ICD-10-CM | POA: Diagnosis not present

## 2014-11-07 DIAGNOSIS — I509 Heart failure, unspecified: Secondary | ICD-10-CM | POA: Diagnosis present

## 2014-11-07 DIAGNOSIS — I272 Other secondary pulmonary hypertension: Secondary | ICD-10-CM

## 2014-11-07 DIAGNOSIS — I313 Pericardial effusion (noninflammatory): Secondary | ICD-10-CM | POA: Diagnosis not present

## 2014-11-07 DIAGNOSIS — I5032 Chronic diastolic (congestive) heart failure: Secondary | ICD-10-CM | POA: Diagnosis not present

## 2014-11-07 DIAGNOSIS — I517 Cardiomegaly: Secondary | ICD-10-CM | POA: Diagnosis not present

## 2014-11-12 ENCOUNTER — Ambulatory Visit (INDEPENDENT_AMBULATORY_CARE_PROVIDER_SITE_OTHER): Payer: Medicaid Other | Admitting: Internal Medicine

## 2014-11-12 ENCOUNTER — Ambulatory Visit (INDEPENDENT_AMBULATORY_CARE_PROVIDER_SITE_OTHER): Payer: Medicaid Other | Admitting: Dietician

## 2014-11-12 ENCOUNTER — Encounter: Payer: Self-pay | Admitting: Dietician

## 2014-11-12 ENCOUNTER — Encounter: Payer: Self-pay | Admitting: Internal Medicine

## 2014-11-12 ENCOUNTER — Other Ambulatory Visit: Payer: Self-pay | Admitting: Student in an Organized Health Care Education/Training Program

## 2014-11-12 VITALS — BP 123/63 | HR 85 | Temp 98.2°F | Ht 63.5 in | Wt 258.3 lb

## 2014-11-12 DIAGNOSIS — Z6841 Body Mass Index (BMI) 40.0 and over, adult: Secondary | ICD-10-CM

## 2014-11-12 DIAGNOSIS — Z7984 Long term (current) use of oral hypoglycemic drugs: Secondary | ICD-10-CM

## 2014-11-12 DIAGNOSIS — Z713 Dietary counseling and surveillance: Secondary | ICD-10-CM

## 2014-11-12 DIAGNOSIS — E119 Type 2 diabetes mellitus without complications: Secondary | ICD-10-CM

## 2014-11-12 DIAGNOSIS — E1142 Type 2 diabetes mellitus with diabetic polyneuropathy: Secondary | ICD-10-CM

## 2014-11-12 DIAGNOSIS — R21 Rash and other nonspecific skin eruption: Secondary | ICD-10-CM | POA: Diagnosis not present

## 2014-11-12 LAB — POCT GLYCOSYLATED HEMOGLOBIN (HGB A1C): HEMOGLOBIN A1C: 5.6

## 2014-11-12 LAB — GLUCOSE, CAPILLARY: GLUCOSE-CAPILLARY: 109 mg/dL — AB (ref 65–99)

## 2014-11-12 NOTE — Patient Instructions (Signed)
General Instructions:  1. Please make a follow up appointment in 3 months.   We will schedule you to follow up with dermatology.   2. Please take all medications as previously prescribed.  3. If you have worsening of your symptoms or new symptoms arise, please call the clinic ([phone]), or go to the ER immediately if symptoms are severe.  You have done a great job in taking all your medications. Please continue to do this.  Please bring your medicines with you each time you come to clinic.  Medicines may include prescription medications, over-the-counter medications, herbal remedies, eye drops, vitamins, or other pills.   Progress Toward Treatment Goals:  Treatment Goal 11/12/2014  Hemoglobin A1C at goal    Self Care Goals & Plans:  Self Care Goal 11/12/2014  Manage my medications take my medicines as prescribed  Monitor my health keep track of my blood glucose  Eat healthy foods -  Meeting treatment goals maintain the current self-care plan    Home Blood Glucose Monitoring 11/12/2014  Check my blood sugar once a day  When to check my blood sugar before breakfast     Care Management & Community Referrals:  Referral 11/12/2014  Referrals made for care management support nutritionist  Referrals made to community resources none

## 2014-11-12 NOTE — Progress Notes (Addendum)
Last visit with Rd, CDE was 08/2011   Diabetes Self-Management Education  Visit Type:  Follow-up- annual for diet education and weight gain  Appt. Start Time: 1615 Appt. End Time: 1308  11/12/2014  [name], identified by name and date of birth, is a 54 y.o. female with a diagnosis of Diabetes: Type 2 diabetes  .   ASSESSMENT  Last menstrual period 02/19/2011.  Diabetes Latest Ref Rng 11/12/2014 10/31/2014 10/05/2014 09/24/2014  Wt (Lbs.)  258.3 257.25  254.5   Diabetes Latest Ref Rng 07/22/2014 07/22/2014 07/01/2014 06/24/2014  Wt (Lbs.)   251 243.75    Diabetes Latest Ref Rng 05/16/2013 05/16/2013 03/22/2013 03/15/2013  Wt (Lbs.)   226.2  214.8   Diabetes Latest Ref Rng 02/12/2013 01/19/2013 01/19/2013 12/08/2012  Wt (Lbs.)  209.5  211    Diabetes Latest Ref Rng 09/29/2012 09/29/2012 09/01/2012 09/01/2012  Wt (Lbs.)   206.4  200.12   Diabetes Latest Ref Rng 08/06/2012 07/15/2012 07/14/2012 07/14/2012  Wt (Lbs.)  233.2   257.28   Diabetes Latest Ref Rng 06/28/2012 06/28/2012 06/26/2012 06/13/2012  Wt (Lbs.)   274.6 270.12    Diabetes Latest Ref Rng 03/15/2012 02/22/2012 02/22/2012 02/15/2012  Wt (Lbs.)  260.9  285.9 283.7   Diabetes Latest Ref Rng 02/11/2012 02/11/2012 01/19/2012 01/04/2012  Wt (Lbs.)    297.6    Diabetes Latest Ref Rng 10/12/2011 09/21/2011 08/31/2011 08/24/2011  Wt (Lbs.)   263.9 272.6            Diabetes Self-Management Education - 11/12/14 1600    Health Coping   How would you rate your overall health? Fair   Psychosocial Assessment   Patient Belief/Attitude about Diabetes Motivated to manage diabetes   Self-care barriers Low literacy;Lack of material resources   Self-management support Doctor's office;Family;CDE visits   Patient Concerns Nutrition/Meal planning;Weight Control   Special Needs Simplified materials   Preferred Learning Style Auditory;Visual;Hands on   Learning Readiness Ready   Complications   Last HgB A1C per patient/outside source 5.7 %   How often  do you check your blood sugar? 0 times/day (not testing)   Number of hypoglycemic episodes per month 0   Number of hyperglycemic episodes per week 0   Have you had a dilated eye exam in the past 12 months? Yes   Patient Education   Nutrition management  Role of diet in the treatment of diabetes and the relationship between the three main macronutrients and blood glucose level;Information on hints to eating out and maintain blood glucose control.   Physical activity and exercise  Role of exercise on diabetes management, blood pressure control and cardiac health.   Monitoring Weight yourself daily   Patient Self-Evaluation of Goals - Patient rates self as meeting previously set goals (% of time)   Nutrition < 25%   Physical Activity < 25%   Outcomes   Program Status Re-entered   Subsequent Visit   Since your last visit have you continued or begun to take your medications as prescribed? Yes   Since your last visit have you had your blood pressure checked? Yes   Is your most recent blood pressure lower, unchanged, or higher since your last visit? Unchanged   Since your last visit have you experienced any weight changes? Gain   Weight Gain (lbs) >60#   Since your last visit, are you checking your blood glucose at least once a day? No      Learning Objective:  Patient will have a greater understanding  of diabetes self-management. Patient education: meal planning for weight loss.  Plan:   There are no Patient Instructions on file for this visit.   Expected Outcomes:  Demonstrated interest in learning. Expect positive outcomes Education material provided: My Plate, Snack sheet and No sodium seasonings If problems or questions, patient to contact team via:  Phone Future DSME appointment: - 4-6 wks - Tecommedn series of 3-4 visit and then annually

## 2014-11-12 NOTE — Progress Notes (Signed)
Subjective:   [name]   DOB: 1960-12-13 54 y.o.   MRN: [id number]  HPI: [name] is a 54 y.o. 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 Patient recently seen at CHF clinic, had complaint of a new rash on her LE's bilaterally. She states it has not been there for long and is not causing her any discomfort. He biggest concern today is her diet. She has continued to gain weight despite attempts to improve her diet. She is very frustrated about calorie counting, sodium content interpretation, and meal planning and feels like it is too much for her to comprehend. She otherwise has been doing quite well, she feels her breathing is at its baseline, still requiring 3L O2 at baseline. Scheduled to follow up in pulmonary HTN clinic at [facility] next week.    Current Outpatient Prescriptions  Medication Sig Dispense Refill  . ACCU-CHEK AVIVA PLUS test strip USE  STRIP TO CHECK GLUCOSE ONCE DAILY 100 each 11  . acetaminophen (TYLENOL) 500 MG tablet Take 1 tablet (500 mg total) by mouth every 6 (six) hours as needed for mild pain, moderate pain, fever or headache. 30 tablet 0  . cetirizine (ZYRTEC) 10 MG tablet TAKE ONE TABLET BY MOUTH ONCE DAILY 30 tablet 1  . cyanocobalamin 500 MCG tablet Take 500 mcg by mouth daily.    . diphenhydrAMINE (BENADRYL) 25 mg capsule Take 25 mg by mouth every 6 (six) hours as needed for itching.    . docusate sodium (COLACE) 100 MG capsule Take 1 capsule (100 mg total) by mouth 2 (two) times daily as needed for mild constipation. 60 capsule 3  . ferrous sulfate 325 (65 FE) MG tablet Take 1 tablet (325 mg total) by mouth daily with breakfast. 90 tablet 3  . gabapentin (NEURONTIN) 300 MG capsule TAKE TWO CAPSULES BY MOUTH THREE TIMES DAILY 180 capsule 5  . loperamide  (IMODIUM) 2 MG capsule Take 2 mg by mouth daily as needed for diarrhea or loose stools.    . metFORMIN (GLUCOPHAGE) 1000 MG tablet Please take 1/2 tablet daily with breakfast 45 tablet 3  . pantoprazole (PROTONIX) 20 MG tablet Take 1 tablet (20 mg total) by mouth 2 (two) times daily. 180 tablet 3  . potassium chloride SA (KLOR-CON M20) 20 MEQ tablet TAKE TWO TABLETS BY MOUTH THREE TIMES DAILY, TAKE EXTRA TABLET WHEN TAKING AN EXTRA TORESMIDE 180 tablet 5  . pravastatin (PRAVACHOL) 20 MG tablet Take 1 tablet (20 mg total) by mouth daily. 90 tablet 3  . sildenafil (REVATIO) 20 MG tablet Take 1 tablet (20 mg total) by mouth 3 (three) times daily. 270 tablet 3  . sodium chloride 0.9 % SOLN with treprostinil 1 MG/ML SOLN Inject 80 ng/kg/min into the vein continuous. Dose 80ng/kg/min based on dosing wt of 92kg. 40m/24hours    . spironolactone (ALDACTONE) 25 MG tablet Take 0.5 tablets (12.5 mg total) by mouth daily. 45 tablet 3  . torsemide (DEMADEX) 20 MG tablet TAKE THREE TABLETS BY MOUTH ONCE DAILY 90 tablet 5  . XARELTO 20 MG TABS tablet TAKE ONE TABLET BY MOUTH ONCE DAILY WITH  SUPPER 30 tablet 5   No current facility-administered medications for this visit.    Review of Systems  General: Denies fever, diaphoresis, appetite change, and fatigue.  Respiratory: Denies SOB, cough, and wheezing.   Cardiovascular: Denies chest pain and palpitations.  Gastrointestinal: Denies nausea, vomiting, abdominal pain, and diarrhea Musculoskeletal: Positive for LE rash. Denies myalgias, back pain, and gait problem.  Neurological: Denies dizziness, syncope, weakness, lightheadedness, and headaches.  Psychiatric/Behavioral: Denies mood changes, sleep disturbance, and agitation.   Objective:   Physical Exam: Filed Vitals:   11/12/14 1510  BP: 123/63  Pulse: 85  Temp: 98.2 F (36.8 C)  TempSrc: Oral  Height: 5' 3.5" (1.613 m)  Weight: 258 lb 4.8 oz (117.164 kg)  SpO2: 93%   General: Obese female,  alert, cooperative, NAD. On 3L [place] HEENT: PERRL, EOMI. Moist mucus membranes Neck: Full range of motion without pain, supple, no lymphadenopathy or carotid bruits Lungs: Clear to ascultation bilaterally, normal work of respiration, no wheezes, rales, rhonchi Heart: RRR, no murmurs, gallops, or rubs Abdomen: Soft, non-tender, non-distended, BS + Extremities: No cyanosis, clubbing, or edema. Strength/sensation intact in LE's. Faint scattered patches of erythema on her legs, specifically her calves and thighs (R>L). No pustules or papules. Unable to determine of blanching given her overlying venous stasis changes.  Neurologic: Alert & oriented x3, cranial nerves II-XII intact, strength grossly intact, sensation intact to light touch   Assessment & Plan:   Please see problem-based assessment and plan.

## 2014-11-12 NOTE — Patient Instructions (Addendum)
Good idea to pack your lunch and take it with you!  When you eat at [employer]- best choices are Salad and roast beef or roasted chicken or [employer]- a side salad or a plain hamburger with water or unsweetened tea  Ideas for lunches- peanut butter and banana sandwich                               [place] sandwich                               Peanut butter and raisin sandwich                                 Try to drink water- 4 cups a day                               2 cups  1% milk,          1 cups unsweetened tea          1 sparkeling water         1 diet soda  For your sweet tooth- put the cakes, cupcakes, cookies and candy in the freezer and only take 1 out to eat per day.  Instead Try to eat fruit for your sweet foods or  Fiber one cookies, mixed fruit, Fiber one cereal Trail Mix- mix cereal  with unsalted nuts and raisin Angel food cake   You want to start measuring your foods, eating out of your divided plate to help you watch your portion size.

## 2014-11-13 MED ORDER — POTASSIUM CHLORIDE CRYS ER 20 MEQ PO TBCR: EXTENDED_RELEASE_TABLET | ORAL | Status: DC

## 2014-11-13 NOTE — Assessment & Plan Note (Signed)
Lab Results  Component Value Date   HGBA1C 5.6 11/12/2014   HGBA1C 5.7 07/22/2014   HGBA1C 5.9 04/09/2014     Assessment: Diabetes control: good control (HgbA1C at goal) Progress toward A1C goal:  at goal Comments: Patient continues to have great control of her DM type II, although she continues to have increase in her weight. Taking Metformin 1000 mg bid. Patient is very frustrated about her weight gain and feels she does not understand how to eat the appropriate portions of food.   Plan: Medications:  continue current medications. If weight improves, can consider discontinuing Metformin.  Home glucose monitoring: Frequency: once a day Timing: before breakfast Instruction/counseling given: reminded to bring medications to each visit, discussed foot care, discussed the need for weight loss and discussed diet Educational resources provided:   Self management tools provided:   Other plans: Patient to meet with Debera Lat today to discuss better way to manage her food intake.

## 2014-11-13 NOTE — Addendum Note (Signed)
Addended byCorky Sox on: 11/13/2014 04:00 PM   Modules accepted: Orders

## 2014-11-13 NOTE — Assessment & Plan Note (Signed)
Faint erythematous rash on LE's, no pustules, or papules. Do not think this is contact dermatitis given no change in medications, soaps, or detergents. Some question of vasculitis.  -Referral to dermatology.

## 2014-11-18 NOTE — Progress Notes (Signed)
Internal Medicine Clinic Attending  Case discussed with Dr. Jones soon after the resident saw the patient.  We reviewed the resident's history and exam and pertinent patient test results.  I agree with the assessment, diagnosis, and plan of care documented in the resident's note. 

## 2014-11-20 ENCOUNTER — Other Ambulatory Visit: Payer: Self-pay | Admitting: Internal Medicine

## 2014-11-20 NOTE — Telephone Encounter (Signed)
Spoke with patient to inform she should have refills at [facility].  She states she just called them too early.

## 2014-11-20 NOTE — Telephone Encounter (Signed)
Pt requesting torsemide to be filled @ [facility].

## 2014-12-03 ENCOUNTER — Ambulatory Visit (INDEPENDENT_AMBULATORY_CARE_PROVIDER_SITE_OTHER): Payer: Medicaid Other | Admitting: Dietician

## 2014-12-03 ENCOUNTER — Encounter: Payer: Self-pay | Admitting: Dietician

## 2014-12-03 VITALS — Wt 264.0 lb

## 2014-12-03 DIAGNOSIS — E1142 Type 2 diabetes mellitus with diabetic polyneuropathy: Secondary | ICD-10-CM

## 2014-12-03 NOTE — Progress Notes (Signed)
Diabetes Self-Management Education  Visit Type:  Follow-up  Appt. Start Time: 1030 Appt. End Time: 1115  12/03/2014  [name], identified by name and date of birth, is a 54 y.o. female with a diagnosis of Diabetes:Type 2  .   ASSESSMENT Patient reports sleep quality 2/5, goes to bed between 12 am and 3 am and arises about 7 AM. Yawning at today's visit.  She is starting to resist sweets, put them put of sight and make healthier choices and portion her foods better. Wants to exercise but doesn't like the cold weather.  Weight 264 lb (119.75 kg), last menstrual period 02/19/2011. Body mass index is 46.03 kg/(m^2).       Diabetes Self-Management Education - 12/03/14 1100    Psychosocial Assessment   Patient Belief/Attitude about Diabetes Motivated to manage diabetes   Self-care barriers Low literacy   Self-management support Doctor's office;Family;CDE visits   Patient Concerns Nutrition/Meal planning;Healthy Lifestyle;Problem Solving   Special Needs Simplified materials   Preferred Learning Style Auditory   Learning Readiness Change in progress   Complications   How often do you check your blood sugar? 1-2 times/day   Fasting Blood glucose range (mg/dL) 70-129   Number of hypoglycemic episodes per month 0   Number of hyperglycemic episodes per week 0   Have you had a dilated eye exam in the past 12 months? Yes   Dietary Intake   Lunch homemade chicken soup and applesauce   Dinner chicken subway sandwich with diet coke   Beverage(s) tea, sparkeling lemonade    Exercise   Exercise Type ADL's   Patient Education   Personal strategies to promote health Helped patient develop diabetes management plan for a low sodium, lower calorie meal plan  and how to incorporate exercise into her life.    Individualized Goals (developed by patient)   Nutrition Follow meal plan discussed   Physical Activity Exercise 1-2 times per week;15 minutes per day   Outcomes   Program Status  Re-entered   Subsequent Visit   Since your last visit have you continued or begun to take your medications as prescribed? Yes   Since your last visit have you had your blood pressure checked? Yes   Is your most recent blood pressure lower, unchanged, or higher since your last visit? Unchanged   Since your last visit have you experienced any weight changes? Gain   Weight Gain (lbs) 5   Since your last visit, are you checking your blood glucose at least once a day? Yes      Learning Objective:  Patient will have a greater understanding of diabetes self-management. Patient education plan is to attend individual and/or group sessions for meal planning and exercise lifestyle modification.   Plan:   Patient Instructions  Try to keep to decaf tea, coffee and soda like diet lemon lime, diet fresca, diet sunkist lemonade these should not bother your sleep.   Warm milk or liquids can sometimes help you go to sleep as long as they are decaffeinated   Regular bedtime and a regular wake up time is really important 1. Start to bed about 10:00 to 10:30 PM to get to sleep by 11 PM and 2. wake up about 7 AM to give you 8 hours of rest.     6 minutes from your house to the [facility] Should be okay to go between 9 AM and 12 PM Director is  Everlean Alstrom- he is supposed to call you  [facility]  Hours: 9 am to 12 noon and 2-9 pm [phone]  Directions from your house to the [facility]:  [place] [address]. [place] on [address] toward [address]     2.2 mi 2.  Turn right to stay on [address]                                 0.3 mi 3.  Turn right [address]                                                 0.1 mi 4.  Turn left onto [facility] will be on the right  [facility], [address] [place], [place] [address]   I will call you about follow up with the group.     Expected Outcomes:  Demonstrated interest in learning. Expect  positive outcomes Education material provided: AVS If problems or questions, patient to contact team via:  Phone  Future DSME appointment: - 4-6 wks

## 2014-12-03 NOTE — Patient Instructions (Signed)
Try to keep to decaf tea, coffee and soda like diet lemon lime, diet fresca, diet sunkist lemonade these should not bother your sleep.   Warm milk or liquids can sometimes help you go to sleep as long as they are decaffeinated   Regular bedtime and a regular wake up time is really important 1. Start to bed about 10:00 to 10:30 PM to get to sleep by 11 PM and 2. wake up about 7 AM to give you 8 hours of rest.     6 minutes from your house to the [facility] Should be okay to go between 9 AM and 12 PM Director is  Everlean Alstrom- he is supposed to call you  [facility] Hours: 9 am to 12 noon and 2-9 pm [phone]  Directions from your house to the [facility]:  [address] 1. [place] on [address]     2.2 mi 2.  Turn right to stay on [address]                                 0.3 mi 3.  Turn right onto [address]                                                 0.1 mi 4.  Turn left onto [address] [facility] will be on the right  [facility], [address], [place], [place] [address]   I will call you about follow up with the group.

## 2014-12-26 ENCOUNTER — Telehealth: Payer: Self-pay | Admitting: Dietician

## 2014-12-26 NOTE — Telephone Encounter (Signed)
Patient agreed to group nutrition visit.

## 2014-12-31 ENCOUNTER — Emergency Department (HOSPITAL_COMMUNITY)
Admission: EM | Admit: 2014-12-31 | Discharge: 2014-12-31 | Disposition: A | Discharge status: Home / Self Care | Payer: Medicaid Other | Attending: Emergency Medicine | Admitting: Emergency Medicine

## 2014-12-31 ENCOUNTER — Encounter (HOSPITAL_COMMUNITY): Payer: Self-pay | Admitting: Vascular Surgery

## 2014-12-31 DIAGNOSIS — Z8619 Personal history of other infectious and parasitic diseases: Secondary | ICD-10-CM | POA: Diagnosis not present

## 2014-12-31 DIAGNOSIS — R040 Epistaxis: Secondary | ICD-10-CM | POA: Diagnosis not present

## 2014-12-31 DIAGNOSIS — G4733 Obstructive sleep apnea (adult) (pediatric): Secondary | ICD-10-CM | POA: Diagnosis not present

## 2014-12-31 DIAGNOSIS — F329 Major depressive disorder, single episode, unspecified: Secondary | ICD-10-CM | POA: Insufficient documentation

## 2014-12-31 DIAGNOSIS — J449 Chronic obstructive pulmonary disease, unspecified: Secondary | ICD-10-CM | POA: Insufficient documentation

## 2014-12-31 DIAGNOSIS — E785 Hyperlipidemia, unspecified: Secondary | ICD-10-CM | POA: Insufficient documentation

## 2014-12-31 DIAGNOSIS — I5030 Unspecified diastolic (congestive) heart failure: Secondary | ICD-10-CM | POA: Insufficient documentation

## 2014-12-31 DIAGNOSIS — D509 Iron deficiency anemia, unspecified: Secondary | ICD-10-CM | POA: Diagnosis not present

## 2014-12-31 DIAGNOSIS — Z79899 Other long term (current) drug therapy: Secondary | ICD-10-CM | POA: Diagnosis not present

## 2014-12-31 DIAGNOSIS — K219 Gastro-esophageal reflux disease without esophagitis: Secondary | ICD-10-CM | POA: Insufficient documentation

## 2014-12-31 DIAGNOSIS — Z9981 Dependence on supplemental oxygen: Secondary | ICD-10-CM | POA: Insufficient documentation

## 2014-12-31 DIAGNOSIS — E119 Type 2 diabetes mellitus without complications: Secondary | ICD-10-CM | POA: Diagnosis not present

## 2014-12-31 DIAGNOSIS — I10 Essential (primary) hypertension: Secondary | ICD-10-CM | POA: Insufficient documentation

## 2014-12-31 NOTE — ED Notes (Signed)
Pt reports to the ED for eval of epistaxis. Pt has been having to increase her home O2 because she has nasal congestion and after she blew her nose she developed a nose bleed. Has been happening off and on for several days. Has hx of pulmonary HTN and HTN and has a drip to her right upper chest for the pulmonary HTN. Bleeding stopped at this time. She is on blood thinners. Pt A&Ox4, resp e/u, and skin warm and dry.

## 2014-12-31 NOTE — ED Provider Notes (Signed)
CSN: [id number]     Arrival date & time 12/31/14  1416 History   First MD Initiated Contact with Patient 12/31/14 1611     Chief Complaint  Patient presents with  . Epistaxis     (Consider location/radiation/quality/duration/timing/severity/associated sxs/prior Treatment) HPI Comments: Pt on xarelto comes in with nasal bleed. Pt reports that she has had some congestion, and has been blowing her nose a lot. Today morning she started having some bleed - which stopped spontaneously. Later on, the bleeding started again, with clots passing and wouldn't stop, so she called 911.  En route, the bleeding stoped with pressure, and hasnt returned.  Patient is a 54 y.o. female presenting with nosebleeds. The history is provided by the patient.  Epistaxis   Past Medical History  Diagnosis Date  . OSA (obstructive sleep apnea)     CPAP  . Venous stasis ulcer ([place])     chornic, ?followed up at wound care center, multiple courses of antibiotics in past for cellulitis, on lasix  . GERD (gastroesophageal reflux disease)   . Anemia, iron deficiency     secondary to menhorrhagia, on oral iron, also b12 def, getting monthly b12 shots  . Chronic cough     secondary to alleriges and post nasal drip  . Depression   . Diabetes mellitus     well controlled on metformin  . Hyperlipidemia   . H/O mental retardation   . Herpes   . Cor pulmonale (HCC)     PA Peak pressure 39mHg  . Diastolic heart failure ([place]   . Restrictive lung disease     PFTs 06/2012 (FVC 54% predicted and FEV1 68% predicted w minimal bronchodilator response).  . CHF ([place]estive heart failure) ([place]   . Renal disorder   . Pulmonary hypertension ([place] [facility]   . Hypertension   . Shortness of breath   . COPD (chronic obstructive pulmonary disease) ([place]   . Cognitive impairment    Past Surgical History  Procedure Laterality Date  . Cholecystectomy    . Left and right heart catheterization with coronary angiogram N/A 02/28/2014   Procedure: LEFT AND RIGHT HEART CATHETERIZATION WITH CORONARY ANGIOGRAM;  Surgeon: DJolaine Artist MD;  [facility];  Service: Cardiovascular;  Laterality: N/A;   Family History  Problem Relation Age of Onset  . Mental illness Sister   . Mental retardation Brother    Social History  Substance Use Topics  . Smoking status: Never Smoker   . Smokeless tobacco: Never Used  . Alcohol Use: No   OB History    Gravida Para Term Preterm AB TAB SAB Ectopic Multiple Living   _0 Review of Systems  Constitutional: Negative for fatigue.  HENT: Positive for nosebleeds.   Skin: Negative for wound.  Hematological: Bruises/bleeds easily.      Allergies  Aspirin; Codeine; Lisinopril; and Sulfonamide derivatives  Home Medications   Prior to Admission medications   Medication Sig Start Date End Date Taking? Authorizing Provider  ACCU-CHEK AVIVA PLUS test strip USE  STRIP TO CHECK GLUCOSE ONCE DAILY 11/23/13  Yes ECorky Sox MD  acetaminophen (TYLENOL) 500 MG tablet Take 1 tablet (500 mg total) by mouth every 6 (six) hours as needed for mild pain, moderate pain, fever or headache. 08/16/13  Yes KAntonietta Breach PA-C  cetirizine (ZYRTEC) 10 MG tablet TAKE ONE TABLET BY MOUTH ONCE DAILY 07/04/14  Yes ECorky Sox MD  cyanocobalamin 500 MCG tablet Take 500 mcg by mouth daily.   Yes Historical Provider, MD  diphenhydrAMINE (BENADRYL) 25 mg capsule Take 25 mg by mouth every 6 (six) hours as needed for itching.   Yes Historical Provider, MD  docusate sodium (COLACE) 100 MG capsule Take 1 capsule (100 mg total) by mouth 2 (two) times daily as needed for mild constipation. 07/23/14  Yes Corky Sox, MD  ferrous sulfate 325 (65 FE) MG tablet Take 1 tablet (325 mg total) by mouth daily with breakfast. 07/23/14  Yes Corky Sox, MD  gabapentin (NEURONTIN) 300 MG capsule TAKE TWO CAPSULES BY MOUTH THREE TIMES DAILY 11/07/14  Yes Corky Sox, MD  loperamide (IMODIUM) 2 MG capsule  Take 2 mg by mouth daily as needed for diarrhea or loose stools.   Yes Historical Provider, MD  metFORMIN (GLUCOPHAGE) 1000 MG tablet Please take 1/2 tablet daily with breakfast 10/05/14  Yes Corky Sox, MD  pantoprazole (PROTONIX) 20 MG tablet Take 1 tablet (20 mg total) by mouth 2 (two) times daily. 10/05/14  Yes Corky Sox, MD  potassium chloride SA (KLOR-CON M20) 20 MEQ tablet TAKE TWO TABLETS BY MOUTH THREE TIMES DAILY, TAKE EXTRA TABLET WHEN TAKING AN EXTRA TORESMIDE 11/13/14  Yes Corky Sox, MD  pravastatin (PRAVACHOL) 20 MG tablet Take 1 tablet (20 mg total) by mouth daily. 10/05/14  Yes Corky Sox, MD  sildenafil (REVATIO) 20 MG tablet Take 1 tablet (20 mg total) by mouth 3 (three) times daily. 10/08/14  Yes Jolaine Artist, MD  spironolactone (ALDACTONE) 25 MG tablet Take 0.5 tablets (12.5 mg total) by mouth daily. 10/05/14  Yes Corky Sox, MD  torsemide (DEMADEX) 20 MG tablet TAKE THREE TABLETS BY MOUTH ONCE DAILY 10/24/14  Yes Corky Sox, MD  XARELTO 20 MG TABS tablet TAKE ONE TABLET BY MOUTH ONCE DAILY WITH  SUPPER 10/05/14  Yes Corky Sox, MD   BP 95/56 mmHg  Pulse 89  Temp(Src) 98.1 F (36.7 C) (Oral)  Resp 16  SpO2 94%  LMP 02/19/2011 Physical Exam  Constitutional: She is oriented to person, place, and time. She appears well-developed.  HENT:  Head: Normocephalic and atraumatic.  Clear medial scab in the L nare.  Eyes: EOM are normal.  Neck: Normal range of motion. Neck supple.  Cardiovascular: Normal rate.   Pulmonary/Chest: Effort normal.  Abdominal: Bowel sounds are normal.  Neurological: She is alert and oriented to person, place, and time.  Skin: Skin is warm and dry.  Nursing note and vitals reviewed.   ED Course  Procedures (including critical care time) Labs Review Labs Reviewed - No data to display  Imaging Review No results found. I have personally reviewed and evaluated these images and lab results as part of my medical decision-making.    EKG Interpretation None      MDM   Final diagnoses:  Epistaxis    Epistaxis - resolved. Cause is likely URI. Advised Vaseline application bid.    Varney Biles, MD 12/31/14 [phone]

## 2014-12-31 NOTE — Discharge Instructions (Signed)
° °  Nosebleed Nosebleeds are common. They are due to a crack in the inside lining of your nose (mucous membrane) or from a small blood vessel that starts to bleed. Nosebleeds can be caused by many conditions, such as injury, infections, dry mucous membranes or dry climate, medicines, nose picking, and home heating and cooling systems. Most nosebleeds come from blood vessels in the front of your nose. HOME CARE INSTRUCTIONS   Try controlling your nosebleed by pinching your nostrils gently and continuously for at least 10 minutes.  Avoid blowing or sniffing your nose for a number of hours after having a nosebleed.  Do not put gauze inside your nose yourself. If your nose was packed by your health care provider, try to maintain the pack inside of your nose until your health care provider removes it.  If a gauze pack was used and it starts to fall out, gently replace it or cut off the end of it.  If a balloon catheter was used to pack your nose, do not cut or remove it unless your health care provider has instructed you to do that.  Avoid lying down while you are having a nosebleed. Sit up and lean forward.  Use a nasal spray decongestant to help with a nosebleed as directed by your health care provider.  Do not use petroleum jelly or mineral oil in your nose. These can drip into your lungs.  Maintain humidity in your home by using less air conditioning or by using a humidifier.  Aspirinand blood thinners make bleeding more likely. If you are prescribed these medicines and you suffer from nosebleeds, ask your health care provider if you should stop taking the medicines or adjust the dose. Do not stop medicines unless directed by your health care provider  Resume your normal activities as you are able, but avoid straining, lifting, or bending at the waist for several days.  If your nosebleed was caused by dry mucous membranes, use over-the-counter saline nasal spray or gel. This will keep the  mucous membranes moist and allow them to heal. If you must use a lubricant, choose the water-soluble variety. Use it only sparingly, and do not use it within several hours of lying down.  Keep all follow-up visits as directed by your health care provider. This is important. SEEK MEDICAL CARE IF:  You have a fever.  You get frequent nosebleeds.  You are getting nosebleeds more often. SEEK IMMEDIATE MEDICAL CARE IF:  Your nosebleed lasts longer than 20 minutes.  Your nosebleed occurs after an injury to your face, and your nose looks crooked or broken.  You have unusual bleeding from other parts of your body.  You have unusual bruising on other parts of your body.  You feel light-headed or you faint.  You become sweaty.  You vomit blood.  Your nosebleed occurs after a head injury.   This information is not intended to replace advice given to you by your health care provider. Make sure you discuss any questions you have with your health care provider.   Document Released: 09/30/2004 Document Revised: 01/11/2014 Document Reviewed: 08/06/2013 Elsevier Interactive Patient Education [employer].

## 2015-01-09 ENCOUNTER — Other Ambulatory Visit: Payer: Self-pay | Admitting: Internal Medicine

## 2015-01-10 NOTE — Telephone Encounter (Signed)
Tried to call lm for rtc

## 2015-01-13 NOTE — Telephone Encounter (Signed)
Tried to call pt, no answer

## 2015-01-14 NOTE — Telephone Encounter (Signed)
Have tried to call pt 3 times, lm

## 2015-01-16 ENCOUNTER — Telehealth: Payer: Self-pay | Admitting: Internal Medicine

## 2015-01-16 NOTE — Telephone Encounter (Signed)
Spoke w/ pt finally, informed her she does not need to test cbg per dr Ronnald Ramp, repeated and then she repeated back. Made f/u appt dr Ronnald Ramp for 2/14 at 1415, she states she will be here

## 2015-01-16 NOTE — Telephone Encounter (Signed)
Pt needs rx refill to get her diabetic strips [facility] on [address]

## 2015-01-30 ENCOUNTER — Ambulatory Visit (INDEPENDENT_AMBULATORY_CARE_PROVIDER_SITE_OTHER): Payer: Medicaid Other | Admitting: Dietician

## 2015-01-30 ENCOUNTER — Encounter: Payer: Self-pay | Admitting: Dietician

## 2015-01-30 VITALS — Wt 258.0 lb

## 2015-01-30 DIAGNOSIS — Z6841 Body Mass Index (BMI) 40.0 and over, adult: Secondary | ICD-10-CM

## 2015-01-30 DIAGNOSIS — E1142 Type 2 diabetes mellitus with diabetic polyneuropathy: Secondary | ICD-10-CM

## 2015-01-30 DIAGNOSIS — E119 Type 2 diabetes mellitus without complications: Secondary | ICD-10-CM | POA: Diagnosis not present

## 2015-01-30 DIAGNOSIS — Z713 Dietary counseling and surveillance: Secondary | ICD-10-CM | POA: Diagnosis not present

## 2015-01-30 NOTE — Progress Notes (Signed)
Diabetes Self-Management Education  Visit Type:  Follow-up  Appt. Start Time: 930 Appt. End Time: 1030  01/30/2015  [name], identified by name and date of birth, is a 55 y.o. female with a diagnosis of Diabetes:type 2  .   ASSESSMENT  Weight 258 lb (117.028 kg), last menstrual period 02/19/2011. Body mass index is 44.98 kg/(m^2).       Diabetes Self-Management Education - 01/30/15 1800    Health Coping   How would you rate your overall health? Good   Psychosocial Assessment   Patient Belief/Attitude about Diabetes Motivated to manage diabetes   Patient Concerns Nutrition/Meal planning   Complications   Are you checking your feet? Yes   How many days per week are you checking your feet? 7   Patient Education   Nutrition management  Role of diet in the treatment of diabetes and the relationship between the three main macronutrients and blood glucose level;Food label reading, portion sizes and measuring food.;Meal options for control of blood glucose level and chronic complications.   Patient Self-Evaluation of Goals - Patient rates self as meeting previously set goals (% of time)   Nutrition 25 - 50%   Outcomes   Program Status Not Completed   Subsequent Visit   Since your last visit have you continued or begun to take your medications as prescribed? Yes   Since your last visit have you had your blood pressure checked? Yes   Is your most recent blood pressure lower, unchanged, or higher since your last visit? Lower   Since your last visit have you experienced any weight changes? Loss   Weight Loss (lbs) 6   Since your last visit, are you checking your blood glucose at least once a day? No      Learning Objective:  Patient will have a greater understanding of diabetes self-management. Patient education plan is to attend individual and/or group sessions for nutrition reveiw.   Plan:   Patient Instructions  Thank you for coming to the group Nutrition group meeting  today.   This is to confirm that you signed up for the next group meeting on:   February 20, 2015 at 10:30 AM  Please feel free to bring food labels, questions and concerns      Expected Outcomes:  Demonstrated interest in learning. Expect positive outcomes  Education material provided: Meal plan card  If problems or questions, patient to contact team via:  Phone  Future DSME appointment: - 2 wks, 4-6 wks

## 2015-01-30 NOTE — Patient Instructions (Signed)
Thank you for coming to the group Nutrition group meeting today.   This is to confirm that you signed up for the next group meeting on:   February 20, 2015 at 10:30 AM  Please feel free to bring food labels, questions and concerns

## 2015-02-11 ENCOUNTER — Other Ambulatory Visit: Payer: Self-pay

## 2015-02-11 DIAGNOSIS — Z1231 Encounter for screening mammogram for malignant neoplasm of breast: Secondary | ICD-10-CM

## 2015-02-18 ENCOUNTER — Encounter: Payer: Self-pay | Admitting: Internal Medicine

## 2015-02-18 ENCOUNTER — Ambulatory Visit (INDEPENDENT_AMBULATORY_CARE_PROVIDER_SITE_OTHER): Payer: Medicaid Other | Admitting: Internal Medicine

## 2015-02-18 VITALS — BP 104/62 | HR 93 | Temp 98.0°F | Ht 63.5 in | Wt 266.7 lb

## 2015-02-18 DIAGNOSIS — R21 Rash and other nonspecific skin eruption: Secondary | ICD-10-CM | POA: Diagnosis not present

## 2015-02-18 DIAGNOSIS — B373 Candidiasis of vulva and vagina: Secondary | ICD-10-CM | POA: Diagnosis not present

## 2015-02-18 DIAGNOSIS — L304 Erythema intertrigo: Secondary | ICD-10-CM | POA: Diagnosis not present

## 2015-02-18 DIAGNOSIS — Z7984 Long term (current) use of oral hypoglycemic drugs: Secondary | ICD-10-CM | POA: Diagnosis not present

## 2015-02-18 DIAGNOSIS — E119 Type 2 diabetes mellitus without complications: Secondary | ICD-10-CM

## 2015-02-18 DIAGNOSIS — B3731 Acute candidiasis of vulva and vagina: Secondary | ICD-10-CM

## 2015-02-18 DIAGNOSIS — E1142 Type 2 diabetes mellitus with diabetic polyneuropathy: Secondary | ICD-10-CM

## 2015-02-18 LAB — GLUCOSE, CAPILLARY: Glucose-Capillary: 74 mg/dL (ref 65–99)

## 2015-02-18 MED ORDER — FLUCONAZOLE 150 MG PO TABS: 150.0000 mg | ORAL_TABLET | ORAL | Status: DC

## 2015-02-18 MED ORDER — NYSTATIN 100000 UNIT/GM EX POWD: CUTANEOUS | Status: DC

## 2015-02-18 NOTE — Patient Instructions (Signed)
1. Make a follow up appointment for 3 months.   2. Please take all medications as previously prescribed with the following changes:  Use Nystatin powder for skin irritation.   Use Diflucan 150 mg once when you have yeast infection.   3. If you have worsening of your symptoms or new symptoms arise, please call the clinic ([phone]), or go to the ER immediately if symptoms are severe.  You have done a great job in taking all your medications. Please continue to do this.

## 2015-02-18 NOTE — Progress Notes (Signed)
Subjective:   [name]   DOB: 1960/01/09 55 y.o.   MRN: [id number]  HPI: [name] is a 55 y.o. 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 Patient was last seen in 11/2014 at which time she had a rash. She was referred to dermatology for concern of reaction with her medications, however, per the patient, she had some "dilated blood vessels" that were benign in nature. This rash has since resolved. Today, she states that she had some mild hemorrhoidal bleeding a few days ago that was very scant in nature and resolved. She also states that she is having some vaginal discomfort and mild pruritus which she has had in the past. She had a full pelvic exam in 04/2014 which did not reveal anything in particular that would cause her symptoms. Pap smear was normal at that time as well. She also mentions some mild rash and moist skin under her abdominal pannus as well. Other than these complaints, she feels very well. Still having some concerns with regards to her weight and diet.    Current Outpatient Prescriptions  Medication Sig Dispense Refill  . ACCU-CHEK AVIVA PLUS test strip USE  STRIP TO CHECK GLUCOSE ONCE DAILY 100 each 11  . acetaminophen (TYLENOL) 500 MG tablet Take 1 tablet (500 mg total) by mouth every 6 (six) hours as needed for mild pain, moderate pain, fever or headache. 30 tablet 0  . cetirizine (ZYRTEC) 10 MG tablet TAKE ONE TABLET BY MOUTH ONCE DAILY 30 tablet 1  . cyanocobalamin 500 MCG tablet Take 500 mcg by mouth daily.    . diphenhydrAMINE (BENADRYL) 25 mg capsule Take 25 mg by mouth every 6 (six) hours as needed for itching.    . docusate sodium (COLACE) 100 MG capsule Take 1 capsule (100 mg total) by mouth 2 (two) times daily as needed for mild constipation. 60 capsule 3  . ferrous sulfate 325 (65  FE) MG tablet Take 1 tablet (325 mg total) by mouth daily with breakfast. 90 tablet 3  . gabapentin (NEURONTIN) 300 MG capsule TAKE TWO CAPSULES BY MOUTH THREE TIMES DAILY 180 capsule 5  . loperamide (IMODIUM) 2 MG capsule Take 2 mg by mouth daily as needed for diarrhea or loose stools.    . metFORMIN (GLUCOPHAGE) 1000 MG tablet Please take 1/2 tablet daily with breakfast 45 tablet 3  . pantoprazole (PROTONIX) 20 MG tablet Take 1 tablet (20 mg total) by mouth 2 (two) times daily. 180 tablet 3  . potassium chloride SA (KLOR-CON M20) 20 MEQ tablet TAKE TWO TABLETS BY MOUTH THREE TIMES DAILY, TAKE EXTRA TABLET WHEN TAKING AN EXTRA TORESMIDE 180 tablet 5  . pravastatin (PRAVACHOL) 20 MG tablet Take 1 tablet (20 mg total) by mouth daily. 90 tablet 3  . sildenafil (REVATIO) 20 MG tablet Take 1 tablet (20 mg total) by mouth 3 (three) times daily. 270 tablet 3  . spironolactone (ALDACTONE) 25 MG tablet Take 0.5 tablets (12.5 mg total) by mouth daily. 45 tablet 3  . torsemide (DEMADEX) 20 MG tablet TAKE THREE TABLETS BY MOUTH ONCE DAILY 90 tablet 5  . XARELTO 20 MG TABS tablet TAKE ONE TABLET BY MOUTH ONCE DAILY WITH  SUPPER 30 tablet 5   No current facility-administered medications for this visit.    Review of Systems  General:  Denies fever, diaphoresis, appetite change, and fatigue.  Respiratory: Denies SOB, cough, and wheezing.   Cardiovascular: Denies chest pain and palpitations.  Gastrointestinal: Denies nausea, vomiting, abdominal pain, and diarrhea Musculoskeletal: Denies myalgias, arthralgias, back pain, and gait problem.  Neurological: Denies dizziness, syncope, weakness, lightheadedness, and headaches.  Psychiatric/Behavioral: Denies mood changes, sleep disturbance, and agitation.    Objective:   Physical Exam: Filed Vitals:   02/18/15 1416  BP: 104/62  Pulse: 93  Temp: 98 F (36.7 C)  TempSrc: Oral  Height: 5' 3.5" (1.613 m)  Weight: 266 lb 11.2 oz (120.974 kg)  SpO2: 97%    General: Obese female, alert, cooperative, NAD. On 3L [place] HEENT: PERRL, EOMI. Moist mucus membranes Neck: Full range of motion without pain, supple, no lymphadenopathy or carotid bruits. RIJ tunneled catheter. Appears clean and dry.  Lungs: Clear to ascultation bilaterally, normal work of respiration, no wheezes, rales, rhonchi Heart: RRR, no murmurs, gallops, or rubs Abdomen: Soft, non-tender, non-distended, BS +. Mild intertrigo appearing rash under abdominal pannus.  Extremities: No cyanosis, clubbing. Trace pitting edema.  Neurologic: Alert & oriented x3, cranial nerves II-XII intact, strength grossly intact, sensation intact to light touch   Assessment & Plan:   Please see problem-based assessment and plan.

## 2015-02-18 NOTE — Assessment & Plan Note (Signed)
Patient complaining of some vaginal discomfort and pruritus. Has some scant whitish discharge. Had pelvic exam with wet prep and pap in 04/2014 for similar complaint, at which time there were no significant findings. No obvious lesions. Feel that she likely has a mild candidal infection, but also likely has some vaginal mucosal atrophy associated with being post-menopausal. Discussed this with her at length.  -Will give Rx for Diflucan 150 mg once. She can take second dose 2 days later if still having some discharge. Patient understands.  -If still having similar complaints at next visit, consider pelvic to assess for lichen sclerosis, etc.

## 2015-02-18 NOTE — Assessment & Plan Note (Signed)
LE rash present during last visit has since resolved.

## 2015-02-18 NOTE — Assessment & Plan Note (Signed)
Patient with mild bilateral intertrigo associated with her abdominal pannus and groin.  -Nystatin powder bid until resolution

## 2015-02-18 NOTE — Assessment & Plan Note (Signed)
CBG 74 today. No symptoms of hypoglycemia. Says she is going to have lunch after her appointment. Will check urine microalbumin/cr today. Check HbA1c in 3 months. Continue Metformin.

## 2015-02-19 LAB — BMP8+ANION GAP
ANION GAP: 18 mmol/L (ref 10.0–18.0)
BUN/Creatinine Ratio: 20 (ref 9–23)
BUN: 13 mg/dL (ref 6–24)
CALCIUM: 8.9 mg/dL (ref 8.7–10.2)
CO2: 21 mmol/L (ref 18–29)
CREATININE: 0.64 mg/dL (ref 0.57–1.00)
Chloride: 101 mmol/L (ref 96–106)
GFR calc Af Amer: 118 mL/min/{1.73_m2} (ref >59)
GFR, EST NON AFRICAN AMERICAN: 102 mL/min/{1.73_m2} (ref >59)
Glucose: 69 mg/dL (ref 65–99)
POTASSIUM: 4.3 mmol/L (ref 3.5–5.2)
Sodium: 140 mmol/L (ref 134–144)

## 2015-02-19 LAB — MICROALBUMIN / CREATININE URINE RATIO
Creatinine, Urine: 123.7 mg/dL
MICROALB/CREAT RATIO: 5.6 mg/g{creat} (ref 0.0–30.0)
MICROALBUM., U, RANDOM: 6.9 ug/mL

## 2015-02-20 ENCOUNTER — Ambulatory Visit (INDEPENDENT_AMBULATORY_CARE_PROVIDER_SITE_OTHER): Payer: Medicaid Other | Admitting: Dietician

## 2015-02-20 VITALS — Wt 266.6 lb

## 2015-02-20 DIAGNOSIS — Z713 Dietary counseling and surveillance: Secondary | ICD-10-CM

## 2015-02-20 DIAGNOSIS — Z6841 Body Mass Index (BMI) 40.0 and over, adult: Secondary | ICD-10-CM

## 2015-02-20 DIAGNOSIS — E1142 Type 2 diabetes mellitus with diabetic polyneuropathy: Secondary | ICD-10-CM

## 2015-02-20 DIAGNOSIS — E119 Type 2 diabetes mellitus without complications: Secondary | ICD-10-CM | POA: Diagnosis not present

## 2015-02-20 NOTE — Progress Notes (Signed)
Diabetes Self-Management Education  Visit Type:  Follow-up  Appt. Start Time: 1030 Appt. End Time: 1130  02/20/2015  [name], identified by name and date of birth, is a 55 y.o. female with a diagnosis of Diabetes:  .  TYPE 2  ASSESSMENT  Weight 266 lb 9.6 oz (120.929 kg), last menstrual period 02/19/2011. Body mass index is 46.48 kg/(m^2).       Diabetes Self-Management Education - 02/20/15 1500    Health Coping   How would you rate your overall health? Good   Psychosocial Assessment   Patient Belief/Attitude about Diabetes Motivated to manage diabetes   Self-care barriers Hard of hearing;Lack of transportation;Unsteady gait/risk for falls;Lack of material resources   Self-management support Doctor's office;CDE visits   Patient Concerns Nutrition/Meal planning   Preferred Learning Style No preference indicated   Learning Readiness Ready   Complications   Last HgB A1C per patient/outside source --  5.6   How often do you check your blood sugar? Not recommended by provider   Have you had a dilated eye exam in the past 12 months? Yes   Dietary Intake   Dinner STE[employer]   Snack (evening) CANDY   Beverage(s) --  SODA   Exercise   Exercise Type ADL's   Patient Education   Nutrition management  Role of diet in the treatment of diabetes and the relationship between the three main macronutrients and blood glucose level;Food label reading, portion sizes and measuring food.; Healthy recipes provided per her request   Physical activity and exercise  Role of exercise on diabetes management, blood pressure control and cardiac health.   Monitoring importance of writing down weigh, activity and food intake   Individualized Goals (developed by patient)   Nutrition Follow meal plan discussed   Physical Activity 15 minutes per day   Patient Self-Evaluation of Goals - Patient rates self as meeting previously set goals (% of time)   Nutrition < 25%   Outcomes   Program  Status Not Completed   Subsequent Visit   Since your last visit have you continued or begun to take your medications as prescribed? Yes   Since your last visit have you had your blood pressure checked? Yes   Is your most recent blood pressure lower, unchanged, or higher since your last visit? Lower   Since your last visit have you experienced any weight changes? Gain   Weight Gain (lbs) 8   Since your last visit, are you checking your blood glucose at least once a day? No      Learning Objective:  Patient will have a greater understanding of diabetes self-management. Patient education plan is to attend  group sessions monthly for nutrition, activity and self-montroing education and support   Plan:   There are no Patient Instructions on file for this visit.   Expected Outcomes:  Demonstrated interest in learning. Expect positive outcomes Education material provided: My Plate If problems or questions, patient to contact team via:  Phone Future DSME appointment: - 4-6 wks - signed up for group

## 2015-02-20 NOTE — Progress Notes (Signed)
Internal Medicine Clinic Attending  Case discussed with Dr. Ronnald Ramp at the time of the visit.  We reviewed the resident's history and exam and pertinent patient test results.  I agree with the assessment, diagnosis, and plan of care documented in the resident's note.

## 2015-02-20 NOTE — Patient Instructions (Signed)
Thank you for coming to the diabetes and weight management group today!!  This is to confirm that you are signed up for the next group session on March 16th at 10:30 AM.  Feel free to bring your room-mate or a friend.   See you then!  [name] [phone]

## 2015-03-03 ENCOUNTER — Ambulatory Visit
Admission: RE | Admit: 2015-03-03 | Discharge: 2015-03-03 | Disposition: A | Discharge status: Home / Self Care | Payer: Medicaid Other | Source: Ambulatory Visit

## 2015-03-03 DIAGNOSIS — Z1231 Encounter for screening mammogram for malignant neoplasm of breast: Secondary | ICD-10-CM

## 2015-03-20 ENCOUNTER — Ambulatory Visit (INDEPENDENT_AMBULATORY_CARE_PROVIDER_SITE_OTHER): Payer: Medicaid Other | Admitting: Dietician

## 2015-03-20 VITALS — Wt 268.7 lb

## 2015-03-20 DIAGNOSIS — E119 Type 2 diabetes mellitus without complications: Secondary | ICD-10-CM | POA: Diagnosis not present

## 2015-03-20 DIAGNOSIS — Z6841 Body Mass Index (BMI) 40.0 and over, adult: Secondary | ICD-10-CM | POA: Diagnosis not present

## 2015-03-20 DIAGNOSIS — Z713 Dietary counseling and surveillance: Secondary | ICD-10-CM | POA: Diagnosis not present

## 2015-03-20 DIAGNOSIS — E1142 Type 2 diabetes mellitus with diabetic polyneuropathy: Secondary | ICD-10-CM

## 2015-03-21 ENCOUNTER — Ambulatory Visit (INDEPENDENT_AMBULATORY_CARE_PROVIDER_SITE_OTHER): Payer: Medicaid Other | Admitting: Internal Medicine

## 2015-03-21 ENCOUNTER — Encounter: Payer: Self-pay | Admitting: Internal Medicine

## 2015-03-21 ENCOUNTER — Other Ambulatory Visit (HOSPITAL_COMMUNITY)
Admission: RE | Admit: 2015-03-21 | Discharge: 2015-03-21 | Disposition: A | Discharge status: Home / Self Care | Payer: Medicaid Other | Source: Ambulatory Visit | Attending: Internal Medicine | Admitting: Internal Medicine

## 2015-03-21 VITALS — BP 119/53 | HR 88 | Temp 97.5°F | Ht 63.5 in | Wt 268.6 lb

## 2015-03-21 DIAGNOSIS — N950 Postmenopausal bleeding: Secondary | ICD-10-CM | POA: Diagnosis not present

## 2015-03-21 DIAGNOSIS — N760 Acute vaginitis: Secondary | ICD-10-CM | POA: Insufficient documentation

## 2015-03-21 LAB — GLUCOSE, CAPILLARY: Glucose-Capillary: 84 mg/dL (ref 65–99)

## 2015-03-21 NOTE — Patient Instructions (Addendum)
1. Please make a follow up in 2 weeks.   I will call you with your lab results.   We will schedule you for a trans-vaginal ultrasound to assess your uterus.   2. Please take all medications as previously prescribed.  3. If you have worsening of your symptoms or new symptoms arise, please call the clinic ([phone]), or go to the ER immediately if symptoms are severe.  You have done a great job in taking all your medications. Please continue to do this.

## 2015-03-21 NOTE — Progress Notes (Signed)
Diabetes Self-Management Education  Visit Type:  Follow-up  Appt. Start Time: 1030 Appt. End Time: 1130  03/21/2015  [name], identified by name and date of birth, is a 55 y.o. female with a diagnosis of Diabetes:  Marland Kitchen  Type 2 diabetes ASSESSMENT  Weight 268 lb 11.2 oz (121.882 kg), last menstrual period 02/19/2011. Body mass index is 46.85 kg/(m^2).       Diabetes Self-Management Education - 03/21/15 1000    Health Coping   How would you rate your overall health? Good   Psychosocial Assessment   Patient Belief/Attitude about Diabetes Motivated to manage diabetes   Patient Concerns Nutrition/Meal planning   Preferred Learning Style No preference indicated   Dietary Intake   Snack (evening) cakes, candy   Exercise   Exercise Type Light (walking / raking leaves)   Patient Education   Nutrition management  Food label reading, portion sizes and measuring food.;Meal options for control of blood glucose level and chronic complications.   Monitoring Increase fruits and vegetables, decrease sweets   Individualized Goals (developed by patient)   Nutrition General guidelines for healthy choices and portions discussed   Physical Activity 15 minutes per day   Patient Self-Evaluation of Goals - Patient rates self as meeting previously set goals (% of time)   Nutrition 25 - 50%   Physical Activity 25 - 50%   Outcomes   Program Status Not Completed   Subsequent Visit   Since your last visit have you continued or begun to take your medications as prescribed? Yes   Since your last visit have you had your blood pressure checked? Yes   Since your last visit have you experienced any weight changes? Gain   Weight Gain (lbs) 2   Since your last visit, are you checking your blood glucose at least once a day? No      Learning Objective:  Patient will have a greater understanding of diabetes self-management. Patient education plan is to attend group classes for diabetes and weight  management   Plan:   Patient Instructions  See you April 20th at 10:30 AM  Your goal is to : Try to eat fruit, granola bars, and light yogurt instead of cakes, candy and cookies     Expected Outcomes:  Demonstrated interest in learning. Expect positive outcomes Education material provided: Food label handouts and Support group flyer If problems or questions, patient to contact team via:  Phone Future DSME appointment: - 4-6 wks

## 2015-03-21 NOTE — Patient Instructions (Signed)
See you April 20th at 10:30 AM  Your goal is to : Try to eat fruit, granola bars, and light yogurt instead of cakes, candy and cookies

## 2015-03-22 LAB — CBC WITH DIFFERENTIAL/PLATELET
BASOS ABS: 0 10*3/uL (ref 0.0–0.2)
Basos: 0 %
EOS (ABSOLUTE): 0 10*3/uL (ref 0.0–0.4)
EOS: 0 %
HEMATOCRIT: 38.3 % (ref 34.0–46.6)
Hemoglobin: 12.6 g/dL (ref 11.1–15.9)
Immature Grans (Abs): 0 10*3/uL (ref 0.0–0.1)
Immature Granulocytes: 0 %
LYMPHS ABS: 2.3 10*3/uL (ref 0.7–3.1)
Lymphs: 24 %
MCH: 27.4 pg (ref 26.6–33.0)
MCHC: 32.9 g/dL (ref 31.5–35.7)
MCV: 83 fL (ref 79–97)
Monocytes Absolute: 0.9 10*3/uL (ref 0.1–0.9)
Monocytes: 10 %
Neutrophils Absolute: 6.2 10*3/uL (ref 1.4–7.0)
Neutrophils: 66 %
PLATELETS: 539 10*3/uL — AB (ref 150–379)
RBC: 4.6 x10E6/uL (ref 3.77–5.28)
RDW: 16 % — ABNORMAL HIGH (ref 12.3–15.4)
WBC: 9.4 10*3/uL (ref 3.4–10.8)

## 2015-03-22 LAB — FERRITIN: Ferritin: 40 ng/mL (ref 15–150)

## 2015-03-24 NOTE — Addendum Note (Signed)
Addended byCorky Sox on: 03/24/2015 03:54 PM   Modules accepted: Level of Service

## 2015-03-24 NOTE — Progress Notes (Signed)
Subjective:   [name]   DOB: 03/10/1960 55 y.o.   MRN: [id number]  HPI: [name] is a 55 y.o. 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 She states this has been going on for some time, just a small amount of bleeding here and there, but is unsure where it is coming from. She is pretty sure it is coming from her vagina however. She says she thinks she had blood in her stool one time, and also thinks she had blood in her urine as well. She also notes some intermittent vaginal discomfort. Previous Pap smear from 1 year ago was normal. She has had weight gain over the past year or so, no recent fevers or night sweats.   Current Outpatient Prescriptions  Medication Sig Dispense Refill  . ACCU-CHEK AVIVA PLUS test strip USE  STRIP TO CHECK GLUCOSE ONCE DAILY 100 each 11  . acetaminophen (TYLENOL) 500 MG tablet Take 1 tablet (500 mg total) by mouth every 6 (six) hours as needed for mild pain, moderate pain, fever or headache. 30 tablet 0  . cetirizine (ZYRTEC) 10 MG tablet TAKE ONE TABLET BY MOUTH ONCE DAILY 30 tablet 1  . cyanocobalamin 500 MCG tablet Take 500 mcg by mouth daily.    . diphenhydrAMINE (BENADRYL) 25 mg capsule Take 25 mg by mouth every 6 (six) hours as needed for itching.    . docusate sodium (COLACE) 100 MG capsule Take 1 capsule (100 mg total) by mouth 2 (two) times daily as needed for mild constipation. 60 capsule 3  . ferrous sulfate 325 (65 FE) MG tablet Take 1 tablet (325 mg total) by mouth daily with breakfast. 90 tablet 3  . fluconazole (DIFLUCAN) 150 MG tablet Take 1 tablet (150 mg total) by mouth every other day. 2 tablet 0  . gabapentin (NEURONTIN) 300 MG capsule TAKE TWO CAPSULES BY MOUTH THREE TIMES DAILY 180 capsule 5  . loperamide (IMODIUM) 2 MG capsule Take 2 mg by mouth  daily as needed for diarrhea or loose stools.    . metFORMIN (GLUCOPHAGE) 1000 MG tablet Please take 1/2 tablet daily with breakfast 45 tablet 3  . nystatin (MYCOSTATIN/NYSTOP) 100000 UNIT/GM POWD Apply to skin folds affected 2 times daily until redness and irritation resolves. 30 g 0  . pantoprazole (PROTONIX) 20 MG tablet Take 1 tablet (20 mg total) by mouth 2 (two) times daily. 180 tablet 3  . potassium chloride SA (KLOR-CON M20) 20 MEQ tablet TAKE TWO TABLETS BY MOUTH THREE TIMES DAILY, TAKE EXTRA TABLET WHEN TAKING AN EXTRA TORESMIDE 180 tablet 5  . pravastatin (PRAVACHOL) 20 MG tablet Take 1 tablet (20 mg total) by mouth daily. 90 tablet 3  . sildenafil (REVATIO) 20 MG tablet Take 1 tablet (20 mg total) by mouth 3 (three) times daily. 270 tablet 3  . spironolactone (ALDACTONE) 25 MG tablet Take 0.5 tablets (12.5 mg total) by mouth daily. 45 tablet 3  . torsemide (DEMADEX) 20 MG tablet TAKE THREE TABLETS BY MOUTH ONCE DAILY 90 tablet 5  . XARELTO 20 MG TABS tablet TAKE ONE TABLET BY MOUTH ONCE DAILY WITH  SUPPER 30 tablet 5   No current facility-administered medications for this visit.    Review of Systems  General: Denies fever, diaphoresis, appetite change, and fatigue.  Respiratory: Denies SOB, cough, and wheezing.  Cardiovascular: Denies chest pain and palpitations.  Gastrointestinal: Denies nausea, vomiting, abdominal pain, and diarrhea GU: Positive for scant vaginal bleeding.  Musculoskeletal: Denies myalgias, arthralgias, back pain, and gait problem.  Neurological: Denies dizziness, syncope, weakness, lightheadedness, and headaches.  Psychiatric/Behavioral: Denies mood changes, sleep disturbance, and agitation.    Objective:   Physical Exam: Filed Vitals:   03/21/15 1343  BP: 119/53  Pulse: 88  Temp: 97.5 F (36.4 C)  TempSrc: Oral  Height: 5' 3.5" (1.613 m)  Weight: 268 lb 9.6 oz (121.836 kg)  SpO2: 95%   General: Obese female, alert, cooperative, NAD. On 3L  [place] HEENT: PERRL, EOMI. Moist mucus membranes Neck: Full range of motion without pain, supple, no lymphadenopathy or carotid bruits. RIJ tunneled catheter. Appears clean and dry.  Lungs: Clear to ascultation bilaterally, normal work of respiration, no wheezes, rales, rhonchi Heart: RRR, no murmurs, gallops, or rubs Abdomen: Soft, non-tender, mildly distended, BS +. Mild intertrigo appearing rash under abdominal pannus.  GU: Pelvic exam with no clear source of bleeding, no pelvic pain, no obvious cervical lesions, although difficult to visualize in its entirety given severe obesity. No obvious external hemorrhoids or apparent rectal bleeding.  Extremities: No cyanosis, clubbing. Trace pitting edema.  Neurologic: Alert & oriented x3, cranial nerves II-XII intact, strength grossly intact, sensation intact to light touch   Assessment & Plan:   Please see problem-based assessment and plan.

## 2015-03-24 NOTE — Assessment & Plan Note (Signed)
Patient says she has had some spotting for a few months here and there. Says she has noticed blood on the toilet paper before and is sure it is coming from her vagina. She does however also note some blood mixed with her stool in the past, as well as she thinks in her urine as well. No abdominal pain, weight loss (actually weight gain), or night sweats. Previous Pap from 1 year ago normal. Abdominal exam slightly distended, obese, non tender. No masses palpated. Pelvic exam with no obvious bleeding noted, no significant discharge, no apparent cervical lesions, although very difficult to visualize all of the cervix given the patient's obesity. No pelvic pain. No obvious external hemorrhoids or apparent rectal bleeding. CBC with no significant anemia, ferritin improved with po iron.  -Given reported vaginal bleeding and difficult pelvic exam, will send for transvaginal US/pelvic US since patient is post-menopausal and endometrial lesions must be excluded. As stated above, somewhat unclear where the bleeding is actually coming from.  -Wet prep pending -RTC in 2 weeks.

## 2015-03-24 NOTE — Progress Notes (Signed)
Internal Medicine Clinic Attending  Case discussed with Dr. Jones soon after the resident saw the patient.  We reviewed the resident's history and exam and pertinent patient test results.  I agree with the assessment, diagnosis, and plan of care documented in the resident's note. 

## 2015-03-25 LAB — CERVICOVAGINAL ANCILLARY ONLY: WET PREP (BD AFFIRM): NEGATIVE

## 2015-04-02 ENCOUNTER — Ambulatory Visit (HOSPITAL_COMMUNITY)
Admission: RE | Admit: 2015-04-02 | Discharge: 2015-04-02 | Disposition: A | Discharge status: Home / Self Care | Payer: Medicaid Other | Source: Ambulatory Visit | Attending: Internal Medicine | Admitting: Internal Medicine

## 2015-04-02 VITALS — BP 120/62 | HR 87 | Wt 256.0 lb

## 2015-04-02 DIAGNOSIS — I272 Other secondary pulmonary hypertension: Secondary | ICD-10-CM | POA: Diagnosis not present

## 2015-04-02 DIAGNOSIS — G4733 Obstructive sleep apnea (adult) (pediatric): Secondary | ICD-10-CM | POA: Insufficient documentation

## 2015-04-02 DIAGNOSIS — I5032 Chronic diastolic (congestive) heart failure: Secondary | ICD-10-CM | POA: Diagnosis not present

## 2015-04-02 DIAGNOSIS — D509 Iron deficiency anemia, unspecified: Secondary | ICD-10-CM | POA: Insufficient documentation

## 2015-04-02 DIAGNOSIS — I4892 Unspecified atrial flutter: Secondary | ICD-10-CM

## 2015-04-02 DIAGNOSIS — E119 Type 2 diabetes mellitus without complications: Secondary | ICD-10-CM | POA: Diagnosis not present

## 2015-04-02 DIAGNOSIS — E785 Hyperlipidemia, unspecified: Secondary | ICD-10-CM | POA: Diagnosis not present

## 2015-04-02 DIAGNOSIS — K219 Gastro-esophageal reflux disease without esophagitis: Secondary | ICD-10-CM | POA: Insufficient documentation

## 2015-04-02 DIAGNOSIS — F79 Unspecified intellectual disabilities: Secondary | ICD-10-CM | POA: Diagnosis not present

## 2015-04-02 DIAGNOSIS — I110 Hypertensive heart disease with heart failure: Secondary | ICD-10-CM | POA: Diagnosis not present

## 2015-04-02 DIAGNOSIS — Z7984 Long term (current) use of oral hypoglycemic drugs: Secondary | ICD-10-CM | POA: Insufficient documentation

## 2015-04-02 DIAGNOSIS — I878 Other specified disorders of veins: Secondary | ICD-10-CM | POA: Insufficient documentation

## 2015-04-02 DIAGNOSIS — J449 Chronic obstructive pulmonary disease, unspecified: Secondary | ICD-10-CM | POA: Insufficient documentation

## 2015-04-02 DIAGNOSIS — Z7901 Long term (current) use of anticoagulants: Secondary | ICD-10-CM | POA: Insufficient documentation

## 2015-04-02 DIAGNOSIS — Z79899 Other long term (current) drug therapy: Secondary | ICD-10-CM | POA: Insufficient documentation

## 2015-04-02 DIAGNOSIS — R21 Rash and other nonspecific skin eruption: Secondary | ICD-10-CM | POA: Diagnosis not present

## 2015-04-02 NOTE — Patient Instructions (Signed)
Will refer you to pulmonary rehab at [facility]. They will call you to set up initial appointment.  Follow up 3 months with Dr. Haroldine Laws.  Do the following things EVERYDAY: 1) Weigh yourself in the morning before breakfast. Write it down and keep it in a log. 2) Take your medicines as prescribed 3) Eat low salt foods-Limit salt (sodium) to 2000 mg per day.  4) Stay as active as you can everyday 5) Limit all fluids for the day to less than 2 liters

## 2015-04-02 NOTE — Progress Notes (Signed)
[name], female   DOB: 03-May-1960, 55 y.o.   MRN: [id number]    [name], female   DOB: 1960-10-02, 55 y.o.    [facility] Cardiologist: Dr Kelby Aline    HPI: [name] is a 55 y.o. 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   She was admitted to [facility] in July 2014 with respiratory failure and RHC showed severe PAH with pulmonary pressures in the 90s. She was transferred to [facility] where she was started on IV Remodulin with excellent results.  She was admitted again 08/16/12 for CP and tachycardia, was found to be in atypical flutter vs atrial tach. Amiodarone was considered but she converted spontaneously. She was started on Xarelto. Her discharge weight was 215 lbs.    She returns today for follow up.  Currently on Revatio 20 tid and Remodulin at 11. Overall feeling ok. Weight at home 256-257 pounds. Denies presyncope/sycope. Continue 3-4 liters oxygen . Taking all medications.    Had CP in 11/15 and Myoview with EF 78% with reversible defect in distal anterior wall and apex. (ischemia vs breast attenuation) EF 78% Cath 2/16: Minimal CAD LAD 30% otherwise normal RHC on Remodulin RA = 8 RV = 45/5/8 PA = 52/24 (34) PCW = 11 Fick cardiac output/index = 7.6/3.6 PVR = 3.0 WU FA sat = 99% PA sat = 70%, 71%  ECHO 08/11/12 EF 60-65% RV moderately dilated Peak PA pressure 48 mmhg  ECHO 02/28/13 EF 55-60% Grade I DD RV severely dilated and HK. Trivial TR. Septum flat Peak PA pressure 53 mmhg ECHO 11/15 EF 55-60% RV normal Trivial TR ECHO 1/16 at [facility] EF >55% RV mildly dilated (improved function) Trivial TR normal IVC ECHO 11/2014: EF 55-60%.  RV modrately dilated. Peak PA pressure 78 mm hg ECHO 03/2015: At [facility] RV  severely dilated. EF >55%   Labs        09/29/12: K+ 3.6, Creatinine 0.87       02/12/13 K 3.4 Creatinine 0.67 ---> given extra potassium and started on 12.5 mg spironolactone.         05/17/2013  K 4.3 Creatinine 0.68        11/15  K 3.4 Cr 0.67       2/16    K 4.1 Cr 0.78       9/16:   K 3.8 Cr 1.08         02/18/2015: K 4.3 Creatinine 0.64            ROS: All systems negative except as listed in HPI, PMH and Problem List.  Past Medical History  Diagnosis Date  . OSA (obstructive sleep apnea)     CPAP  . Venous stasis ulcer ([place])     chornic, ?followed up at wound care center, multiple courses of antibiotics in past for cellulitis, on lasix  . GERD (gastroesophageal reflux disease)   . Anemia, iron deficiency     secondary to menhorrhagia, on oral iron, also b12 def, getting monthly b12 shots  . Chronic cough     secondary to alleriges and post nasal drip  . Depression   . Diabetes mellitus     well controlled on metformin  . Hyperlipidemia   . H/O mental retardation   .  Herpes   . Cor pulmonale (HCC)     PA Peak pressure 26mHg  . Diastolic heart failure ([facility]   . Restrictive lung disease     PFTs 06/2012 (FVC 54% predicted and FEV1 68% predicted w minimal bronchodilator response).  . CHF (congestive heart failure) ([place]   . Renal disorder   . Pulmonary hypertension ([place]   . Hypertension   . Shortness of breath   . COPD (chronic obstructive pulmonary disease) ([place]   . Cognitive impairment     Current Outpatient Prescriptions  Medication Sig Dispense Refill  . ACCU-CHEK AVIVA PLUS test strip USE  STRIP TO CHECK GLUCOSE ONCE DAILY 100 each 11  . acetaminophen (TYLENOL) 500 MG tablet Take 1 tablet (500 mg total) by mouth every 6 (six) hours as needed for mild pain, moderate pain, fever or headache. 30 tablet 0  . cetirizine (ZYRTEC) 10 MG tablet TAKE ONE TABLET BY MOUTH ONCE DAILY 30 tablet 1  . cyanocobalamin 500 MCG tablet Take 500 mcg by mouth daily.    .  diphenhydrAMINE (BENADRYL) 25 mg capsule Take 25 mg by mouth every 6 (six) hours as needed for itching.    . docusate sodium (COLACE) 100 MG capsule Take 1 capsule (100 mg total) by mouth 2 (two) times daily as needed for mild constipation. 60 capsule 3  . ferrous sulfate 325 (65 FE) MG tablet Take 1 tablet (325 mg total) by mouth daily with breakfast. 90 tablet 3  . fluconazole (DIFLUCAN) 150 MG tablet Take 1 tablet (150 mg total) by mouth every other day. 2 tablet 0  . gabapentin (NEURONTIN) 300 MG capsule TAKE TWO CAPSULES BY MOUTH THREE TIMES DAILY 180 capsule 5  . loperamide (IMODIUM) 2 MG capsule Take 2 mg by mouth daily as needed for diarrhea or loose stools.    . metFORMIN (GLUCOPHAGE) 1000 MG tablet Please take 1/2 tablet daily with breakfast 45 tablet 3  . nystatin (MYCOSTATIN/NYSTOP) 100000 UNIT/GM POWD Apply to skin folds affected 2 times daily until redness and irritation resolves. 30 g 0  . pantoprazole (PROTONIX) 20 MG tablet Take 1 tablet (20 mg total) by mouth 2 (two) times daily. 180 tablet 3  . potassium chloride SA (KLOR-CON M20) 20 MEQ tablet TAKE TWO TABLETS BY MOUTH THREE TIMES DAILY, TAKE EXTRA TABLET WHEN TAKING AN EXTRA TORESMIDE 180 tablet 5  . pravastatin (PRAVACHOL) 20 MG tablet Take 1 tablet (20 mg total) by mouth daily. 90 tablet 3  . sildenafil (REVATIO) 20 MG tablet Take 1 tablet (20 mg total) by mouth 3 (three) times daily. 270 tablet 3  . spironolactone (ALDACTONE) 25 MG tablet Take 0.5 tablets (12.5 mg total) by mouth daily. 45 tablet 3  . torsemide (DEMADEX) 20 MG tablet TAKE THREE TABLETS BY MOUTH ONCE DAILY 90 tablet 5  . XARELTO 20 MG TABS tablet TAKE ONE TABLET BY MOUTH ONCE DAILY WITH  SUPPER 30 tablet 5   No current facility-administered medications for this encounter.    Filed Vitals:   04/02/15 1024  BP: 120/62  Pulse: 87  Weight: 256 lb (116.121 kg)  SpO2: 97%    Physical Exam:  General: Morbidly obese. On chronic 3 L O2 used rolling walker to  ambulate into clinic.  HEENT: normal  Neck: supple. JVP hard to see doesn't look elevated  Carotids 2+ bilat; no bruits. No lymphadenopathy or thryomegaly appreciated.  Cor: PMI nonpalpable.Regular rate. I/VI TR, increased P2.  Hickman catheter site ok. .  Lungs: clear  Abdomen: obese soft, nontender, nondistended. No hepatosplenomegaly. No bruits or masses. Good bowel sounds.  Extremities: no cyanosis, clubbing, no edema.  Neuro: alert & orientedx3, cranial nerves grossly intact. moves all 4 extremities w/o difficulty. Affect pleasant    ASSESSMENT & PLAN:  1) Chronic diastolic HF/cor pulmonale:  - Recent ECHO at [facility].  - Continue torsemide to 60 mg daily. Can take extra as needed. - Continue current meds.  2) PAH, severe with cor pulmonale.  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 However PAH far out of proportion to L-sided pressures or restrictive lung disease. - Overall stable. NYHA II.  Doing well with sildenafill and Remodulin  - Follows with [facility]. Next appointment September 2017.  - Continue current regimen. Refer to pulmonary rehab.  3) Paroxysmal atrial flutter.  Regular pulse today. Continue Xarelto. 4) Rash -  Evaluated by  Dermatology with recommendations for benadryl as needed.   Follow up in 3 months with Dr Haroldine Laws. Refer to Pulmonary Rehab.   Faith Veley Ninfa Meeker, NP-C    10:39 AM

## 2015-04-04 ENCOUNTER — Telehealth: Payer: Self-pay | Admitting: *Deleted

## 2015-04-04 ENCOUNTER — Ambulatory Visit: Payer: Self-pay | Admitting: Internal Medicine

## 2015-04-06 ENCOUNTER — Other Ambulatory Visit: Payer: Self-pay | Admitting: Internal Medicine

## 2015-04-14 ENCOUNTER — Ambulatory Visit (HOSPITAL_COMMUNITY)
Admission: RE | Admit: 2015-04-14 | Discharge: 2015-04-14 | Disposition: A | Discharge status: Home / Self Care | Payer: Medicaid Other | Source: Ambulatory Visit | Attending: Internal Medicine | Admitting: Internal Medicine

## 2015-04-14 ENCOUNTER — Telehealth (HOSPITAL_COMMUNITY): Payer: Self-pay | Admitting: Vascular Surgery

## 2015-04-14 DIAGNOSIS — N950 Postmenopausal bleeding: Secondary | ICD-10-CM | POA: Diagnosis not present

## 2015-04-14 DIAGNOSIS — D259 Leiomyoma of uterus, unspecified: Secondary | ICD-10-CM | POA: Insufficient documentation

## 2015-04-14 DIAGNOSIS — N9489 Other specified conditions associated with female genital organs and menstrual cycle: Secondary | ICD-10-CM | POA: Insufficient documentation

## 2015-04-14 NOTE — Telephone Encounter (Signed)
Left pt message to change 07/01/15 appt to another day db w/ not be here

## 2015-04-24 ENCOUNTER — Ambulatory Visit (INDEPENDENT_AMBULATORY_CARE_PROVIDER_SITE_OTHER): Payer: Medicaid Other | Admitting: Dietician

## 2015-04-24 VITALS — Wt 270.8 lb

## 2015-04-24 DIAGNOSIS — Z713 Dietary counseling and surveillance: Secondary | ICD-10-CM | POA: Diagnosis not present

## 2015-04-24 DIAGNOSIS — E119 Type 2 diabetes mellitus without complications: Secondary | ICD-10-CM | POA: Diagnosis not present

## 2015-04-24 DIAGNOSIS — E1142 Type 2 diabetes mellitus with diabetic polyneuropathy: Secondary | ICD-10-CM

## 2015-04-24 NOTE — Progress Notes (Signed)
D Diabetes Self-Management Education  Visit Type:  Follow-up  Appt. Start Time: 1030 Appt. End Time: 1130  04/24/2015  [name], identified by name and date of birth, is a 55 y.o. female with a diagnosis of Diabetes:  Marland Kitchen  Type 2 ASSESSMENT [name] verbalized that she is struggling with making healthier choices in her food and finding activity that she can do and sustain.   Weight 270 lb 12.8 oz (122.834 kg), last menstrual period 02/19/2011. Body mass index is 47.21 kg/(m^2).       Diabetes Self-Management Education - 04/24/15 1100    Health Coping   How would you rate your overall health? Good   Psychosocial Assessment   Patient Belief/Attitude about Diabetes Motivated to manage diabetes   Self-care barriers Low literacy;Lack of transportation;Lack of material resources; family eats differently   Self-management support Doctor's office;Support group;Diabetes magazine or newsletters;CDE visits   Patient Concerns Nutrition/Meal planning   Preferred Learning Style No preference indicated   Exercise   Exercise Type ADL's;Light (walking )   Patient Education   Medications Reviewed patients medication for diabetes, action, purpose, timing of dose and side effects.   Acute complications Taught treatment of hypoglycemia - the 15 rule.;Discussed and identified patients' treatment of hyperglycemia.   Individualized Goals (developed by patient)   Nutrition General guidelines for healthy choices and portions discussed   Physical Activity 15 minutes per day   Patient Self-Evaluation of Goals - Patient rates self as meeting previously set goals (% of time)   Nutrition 25 - 50%   Physical Activity 25 - 50%   Outcomes   Program Status Not Completed   Subsequent Visit   Since your last visit have you continued or begun to take your medications as prescribed? Yes   Since your last visit have you experienced any weight changes? Gain   Weight Gain (lbs) 2   Since your last visit, are  you checking your blood glucose at least once a day? No      Learning Objective:  Patient will have a greater understanding of diabetes self-management. Patient education plan is to attend i group sessions for diabetes and weight management.    Plan:   Patient Instructions  Thank you for coming to the group Nutrition group meeting today.  This is to confirm that you signed up for the next group meeting on:   May 20, 2015 at 10:30 AM  Please feel free to bring food labels, questions and concerns    Expected Outcomes:  Demonstrated interest in learning. Expect positive outcomes Education material provided: Living Well with Diabetes If problems or questions, patient to contact team via:  Phone Future DSME appointment: - 4-6 wks

## 2015-04-24 NOTE — Patient Instructions (Signed)
Thank you for coming to the group Nutrition group meeting today.  This is to confirm that you signed up for the next group meeting on:   May 20, 2015 at 10:30 AM  Please feel free to bring food labels, questions and concerns

## 2015-05-04 ENCOUNTER — Other Ambulatory Visit: Payer: Self-pay | Admitting: Internal Medicine

## 2015-05-13 ENCOUNTER — Encounter: Payer: Self-pay | Admitting: Internal Medicine

## 2015-05-13 ENCOUNTER — Ambulatory Visit (INDEPENDENT_AMBULATORY_CARE_PROVIDER_SITE_OTHER): Payer: Medicaid Other | Admitting: Internal Medicine

## 2015-05-13 VITALS — BP 105/51 | HR 89 | Temp 97.7°F | Ht 63.5 in | Wt 272.4 lb

## 2015-05-13 DIAGNOSIS — E119 Type 2 diabetes mellitus without complications: Secondary | ICD-10-CM

## 2015-05-13 DIAGNOSIS — Z7984 Long term (current) use of oral hypoglycemic drugs: Secondary | ICD-10-CM | POA: Diagnosis not present

## 2015-05-13 DIAGNOSIS — E876 Hypokalemia: Secondary | ICD-10-CM | POA: Diagnosis not present

## 2015-05-13 DIAGNOSIS — E1142 Type 2 diabetes mellitus with diabetic polyneuropathy: Secondary | ICD-10-CM

## 2015-05-13 DIAGNOSIS — N950 Postmenopausal bleeding: Secondary | ICD-10-CM

## 2015-05-13 LAB — POCT GLYCOSYLATED HEMOGLOBIN (HGB A1C): Hemoglobin A1C: 6.2

## 2015-05-13 LAB — GLUCOSE, CAPILLARY: Glucose-Capillary: 117 mg/dL — ABNORMAL HIGH (ref 65–99)

## 2015-05-13 MED ORDER — REPLENS VA GEL: VAGINAL | Status: DC

## 2015-05-13 MED ORDER — POTASSIUM CHLORIDE CRYS ER 20 MEQ PO TBCR: EXTENDED_RELEASE_TABLET | ORAL | Status: DC

## 2015-05-13 NOTE — Assessment & Plan Note (Signed)
Refill potassium supp

## 2015-05-13 NOTE — Progress Notes (Signed)
Subjective:   [name]   DOB: [birth year] 55 y.o.   MRN: [id number]  HPI: [name] is a 55 y.o. F w/ PMHx of mental retardation, DM type II, HTN, HLD, Cor Pulmonale 2/2 restrictive lung disease, and multiple other co-morbidities, presents to clinic today for follow up regarding her vaginal bleeding. Says she doesn't think she has had any more since her last visit. She was sent for [place] which showed a simple cyst and a fibroid. No abdominal discomfort, although she continues to have vaginal pruritus and some intermittent burning as well, despite previous Rx of Diflucan for suspected Candidal infection. During herr last pelvic exam, it was noticed that she did have some vaginal dryness and decreased rugal folds, with no obvious source of bleeding. Since her last visit she says she has been feeling very well. No other significant complaints.  Current Outpatient Prescriptions  Medication Sig Dispense Refill  . ACCU-CHEK AVIVA PLUS test strip USE  STRIP TO CHECK GLUCOSE ONCE DAILY 100 each 11  . acetaminophen (TYLENOL) 500 MG tablet Take 1 tablet (500 mg total) by mouth every 6 (six) hours as needed for mild pain, moderate pain, fever or headache. 30 tablet 0  . cetirizine (ZYRTEC) 10 MG tablet TAKE ONE TABLET BY MOUTH ONCE DAILY 30 tablet 1  . cyanocobalamin 500 MCG tablet Take 500 mcg by mouth daily.    . diphenhydrAMINE (BENADRYL) 25 mg capsule Take 25 mg by mouth every 6 (six) hours as needed for itching.    . docusate sodium (COLACE) 100 MG capsule Take 1 capsule (100 mg total) by mouth 2 (two) times daily as needed for mild constipation. 60 capsule 3  . ferrous sulfate 325 (65 FE) MG tablet Take 1 tablet (325 mg total) by mouth daily with breakfast. 90 tablet 3  . fluconazole (DIFLUCAN) 150 MG tablet Take 1 tablet (150 mg total) by mouth every other day. 2 tablet 0  . gabapentin (NEURONTIN) 300 MG capsule TAKE TWO CAPSULES BY MOUTH THREE TIMES DAILY 180 capsule 5    . loperamide (IMODIUM) 2 MG capsule Take 2 mg by mouth daily as needed for diarrhea or loose stools.    . metFORMIN (GLUCOPHAGE) 1000 MG tablet Please take 1/2 tablet daily with breakfast 45 tablet 3  . nystatin (MYCOSTATIN/NYSTOP) 100000 UNIT/GM POWD Apply to skin folds affected 2 times daily until redness and irritation resolves. 30 g 0  . pantoprazole (PROTONIX) 20 MG tablet Take 1 tablet (20 mg total) by mouth 2 (two) times daily. 180 tablet 3  . potassium chloride SA (KLOR-CON M20) 20 MEQ tablet TAKE TWO TABLETS BY MOUTH THREE TIMES DAILY, TAKE EXTRA TABLET WHEN TAKING AN EXTRA TORESMIDE 180 tablet 5  . pravastatin (PRAVACHOL) 20 MG tablet Take 1 tablet (20 mg total) by mouth daily. 90 tablet 3  . sildenafil (REVATIO) 20 MG tablet Take 1 tablet (20 mg total) by mouth 3 (three) times daily. 270 tablet 3  . spironolactone (ALDACTONE) 25 MG tablet Take 0.5 tablets (12.5 mg total) by mouth daily. 45 tablet 3  . torsemide (DEMADEX) 20 MG tablet TAKE THREE TABLETS BY MOUTH ONCE DAILY 90 tablet 2  . XARELTO 20 MG TABS tablet TAKE ONE TABLET BY MOUTH ONCE DAILY WITH  SUPPER 30 tablet 5   No current facility-administered medications for this visit.    Review of Systems  General: Denies fever, diaphoresis, appetite change, and fatigue.  Respiratory: Denies SOB, cough, and wheezing.  Cardiovascular: Denies chest pain and palpitations.  Gastrointestinal: Denies nausea, vomiting, abdominal pain, and diarrhea Musculoskeletal: Denies myalgias, arthralgias, back pain, and gait problem.  Neurological: Denies dizziness, syncope, weakness, lightheadedness, and headaches.  Psychiatric/Behavioral: Denies mood changes, sleep disturbance, and agitation.     Objective:   Physical Exam: Filed Vitals:   05/13/15 1340  BP: 105/51  Pulse: 89  Temp: 97.7 F (36.5 C)  TempSrc: Oral  Height: 5' 3.5" (1.613 m)  Weight: 272 lb 6.4 oz (123.56 kg)  SpO2: 95%   General: Obese female, alert, cooperative,  NAD. On 3L [place] HEENT: PERRL, EOMI. Moist mucus membranes Neck: Full range of motion without pain, supple, no lymphadenopathy or carotid bruits. RIJ tunneled catheter. Appears clean and dry.  Lungs: Clear to ascultation bilaterally, normal work of respiration, no wheezes, rales, rhonchi Heart: RRR, no murmurs, gallops, or rubs Abdomen: Soft, non-tender, mildly distended, BS +.  Extremities: No cyanosis, clubbing. Trace pitting edema.  Neurologic: Alert & oriented x3, cranial nerves II-XII intact, strength grossly intact, sensation intact to light touch   Assessment & Plan:   Please see problem-based assessment and plan.

## 2015-05-13 NOTE — Patient Instructions (Signed)
1. Please make a follow up appointment for 3 months.   2. Please take all medications as previously prescribed with the following changes:  Start using Replens gel three times per week. Apply to vagina to help with dryness and discomfort.   I have also placed a referral to the OB/GYN doctor to assess if they think you need an endometrial biopsy.   3. If you have worsening of your symptoms or new symptoms arise, please call the clinic ([phone]), or go to the ER immediately if symptoms are severe.  You have done a great job in taking all your medications. Please continue to do this.  Uterine Fibroids Uterine fibroids are tissue masses (tumors) that can develop in the womb (uterus). They are also called leiomyomas. This type of tumor is not cancerous (benign) and does not spread to other parts of the body outside of the pelvic area, which is between the hip bones. Occasionally, fibroids may develop in the fallopian tubes, in the cervix, or on the support structures (ligaments) that surround the uterus. You can have one or many fibroids. Fibroids can vary in size, weight, and where they grow in the uterus. Some can become quite large. Most fibroids do not require medical treatment. CAUSES A fibroid can develop when a single uterine cell keeps growing (replicating). Most cells in the human body have a control mechanism that keeps them from replicating without control. SIGNS AND SYMPTOMS Symptoms may include:   Heavy bleeding during your period.  Bleeding or spotting between periods.  Pelvic pain and pressure.  Bladder problems, such as needing to urinate more often (urinary frequency) or urgently.  Inability to reproduce offspring (infertility).  Miscarriages. DIAGNOSIS Uterine fibroids are diagnosed through a physical exam. Your health care provider may feel the lumpy tumors during a pelvic exam. Ultrasonography and an MRI may be done to determine the size, location, and number of  fibroids. TREATMENT Treatment may include:  Watchful waiting. This involves getting the fibroid checked by your health care provider to see if it grows or shrinks. Follow your health care provider's recommendations for how often to have this checked.  Hormone medicines. These can be taken by mouth or given through an intrauterine device (IUD).  Surgery.  Removing the fibroids (myomectomy) or the uterus (hysterectomy).  Removing blood supply to the fibroids (uterine artery embolization). If fibroids interfere with your fertility and you want to become pregnant, your health care provider may recommend having the fibroids removed.  HOME CARE INSTRUCTIONS  Keep all follow-up visits as directed by your health care provider. This is important.  Take medicines only as directed by your health care provider.  If you were prescribed a hormone treatment, take the hormone medicines exactly as directed.  Do not take aspirin, because it can cause bleeding.  Ask your health care provider about taking iron pills and increasing the amount of dark green, leafy vegetables in your diet. These actions can help to boost your blood iron levels, which may be affected by heavy menstrual bleeding.  Pay close attention to your period and tell your health care provider about any changes, such as:  Increased blood flow that requires you to use more pads or tampons than usual per month.  A change in the number of days that your period lasts per month.  A change in symptoms that are associated with your period, such as abdominal cramping or back pain. SEEK MEDICAL CARE IF:  You have pelvic pain, back pain, or abdominal  cramps that cannot be controlled with medicines.  You have an increase in bleeding between and during periods.  You soak tampons or pads in a half hour or less.  You feel lightheaded, extra tired, or weak. SEEK IMMEDIATE MEDICAL CARE IF:  You faint.  You have a sudden increase in pelvic  pain.   This information is not intended to replace advice given to you by your health care provider. Make sure you discuss any questions you have with your health care provider.   Document Released: 12/19/1999 Document Revised: 01/11/2014 Document Reviewed: 06/19/2013 Elsevier Interactive Patient Education [employer].

## 2015-05-13 NOTE — Assessment & Plan Note (Signed)
Continue to believe this is most likely related to vaginal atrophy and irritation that has led to some scant bleeding. Transvaginal and pelvic US showed a simple cyst and a fibroid. However, given that fact that she reports vaginal bleeding, still think she may require an endometrial biopsy to rule out malignancy.  -Refer to OB/GYN for this -Start Replens vaginal lubricant three times weekly for symptoms -RTC in 3 months

## 2015-05-13 NOTE — Assessment & Plan Note (Signed)
Lab Results  Component Value Date   HGBA1C 6.2 05/13/2015   HGBA1C 5.6 11/12/2014   HGBA1C 5.7 07/22/2014     Assessment: Diabetes control:  Well controlled Progress toward A1C goal:   Worsening HbA1c Comments: Taking Metformin 500 mg daily  Plan: Medications:  continue current medications. Will have to keep an eye on this given her slowly increasing HbA1c. May need increased dose of Metformin if this continues to go up.  Instruction/counseling given: reminded to bring blood glucose meter & log to each visit, reminded to bring medications to each visit, discussed foot care, discussed the need for weight loss and discussed diet Other plans: Patient continues to see Debera Lat for nutrition assistance.

## 2015-05-15 NOTE — Progress Notes (Signed)
Case discussed with Dr. Jones at the time of the visit.  We reviewed the resident's history and exam and pertinent patient test results.  I agree with the assessment, diagnosis, and plan of care documented in the resident's note. 

## 2015-05-15 NOTE — Addendum Note (Signed)
Addended by: Oval Linsey D on: 05/15/2015 08:28 AM   Modules accepted: Level of Service

## 2015-05-22 ENCOUNTER — Ambulatory Visit (INDEPENDENT_AMBULATORY_CARE_PROVIDER_SITE_OTHER): Payer: Medicaid Other | Admitting: Dietician

## 2015-05-22 VITALS — Wt 275.4 lb

## 2015-05-22 DIAGNOSIS — Z713 Dietary counseling and surveillance: Secondary | ICD-10-CM

## 2015-05-22 DIAGNOSIS — Z6841 Body Mass Index (BMI) 40.0 and over, adult: Secondary | ICD-10-CM | POA: Diagnosis not present

## 2015-05-22 DIAGNOSIS — E1142 Type 2 diabetes mellitus with diabetic polyneuropathy: Secondary | ICD-10-CM

## 2015-05-22 DIAGNOSIS — E119 Type 2 diabetes mellitus without complications: Secondary | ICD-10-CM | POA: Diagnosis not present

## 2015-05-23 NOTE — Progress Notes (Signed)
Diabetes Self-Management Education  Visit Type:  Follow-up  Appt. Start Time: 1030 Appt. End Time: 1130  05/23/2015  [name], identified by name and date of birth, is a 55 y.o. female with a diagnosis of Diabetes:  Marland Kitchen  Type 2 diabetes ASSESSMENT  She reports still eating out often, high fat and sugared snacks. She is thinking about packing lunch and snacks when she goes out and eating healthier snacks. This is encouraged at today's meeting Weight 275 lb 6.4 oz (124.921 kg), last menstrual period 02/19/2011. Body mass index is 48.01 kg/(m^2).       Diabetes Self-Management Education - 05/23/15 0900    Health Coping   How would you rate your overall health? Good   Psychosocial Assessment   Patient Concerns Nutrition/Meal planning;Weight Control   Special Needs Simplified materials   Preferred Learning Style Auditory;Visual;Hands on   Learning Readiness Contemplating   Pre-Education Assessment   Patient understands the diabetes disease and treatment process. Demonstrates understanding / competency   Patient understands incorporating nutritional management into lifestyle. Needs Review   Patient undertands incorporating physical activity into lifestyle. Needs Review   Patient understands using medications safely. Demonstrates understanding / competency   Patient understands monitoring blood glucose, interpreting and using results Demonstrates understanding / competency   Patient understands prevention, detection, and treatment of acute complications. Demonstrates understanding / competency   Patient understands prevention, detection, and treatment of chronic complications. Demonstrates understanding / competency   Patient understands how to develop strategies to address psychosocial issues. Demonstrates understanding / competency   Patient understands how to develop strategies to promote health/change behavior. Needs Review   Complications   Are you checking your feet? No  she  cannot see her feet well, they are fine today   Dietary Intake   Breakfast steak biscuit   Lunch restaurant foods- double portions in some cases   Snack (afternoon) ice cream   Dinner vegetableas and beef soup   Exercise   Exercise Type ADL's;Light (walking / raking leaves)   Individualized Goals (developed by patient)   Nutrition She set these goals today " I will eat ONLY healthy snacks" and I will eat only regular size meals at fast food. "   Physical Activity 15 minutes per day   Patient Self-Evaluation of Goals - Patient rates self as meeting previously set goals (% of time)   Nutrition 25 - 50%   Physical Activity 25 - 50%   Outcomes   Program Status Not Completed   Subsequent Visit   Since your last visit have you continued or begun to take your medications as prescribed? Yes   Since your last visit have you had your blood pressure checked? Yes   Is your most recent blood pressure lower, unchanged, or higher since your last visit? Lower   Since your last visit have you experienced any weight changes? Gain   Weight Gain (lbs) 5   Since your last visit, are you checking your blood glucose at least once a day? No      Learning Objective:  Patient will have a greater understanding of diabetes self-management. Patient education plan is Nutrition, physical activity and promote health change/behaviors.  My plan to support myself in continuing these changes to care for my diabetes is to attend or contact:  Weight Management ? diabetes visit monthly, phone calls in between Exercise ? Other: consider [facility] Diabetes Support Groups Type 2 diabetes support group : 2nd Monday of every month from  6-7 PM at [address] [facility] conference room [phone] Other -doctor's office, CDE, Dietitian, pharmacist, church  Plan:   There are no Patient Instructions on file for this visit.   Expected Outcomes:  Demonstrated interest in learning. Expect positive  outcomes  Education material provided: Snack sheet  If problems or questions, patient to contact team via:  Phone  Future DSME appointment: - 4-6 wks

## 2015-05-26 ENCOUNTER — Other Ambulatory Visit: Payer: Self-pay | Admitting: Internal Medicine

## 2015-06-03 ENCOUNTER — Telehealth: Payer: Self-pay | Admitting: Dietician

## 2015-06-03 NOTE — Telephone Encounter (Signed)
Called patient to follow up on her goal to eat healthier lower sugar and salt snacks. She rates herself a 12 /100 today. We discussed possible barriers to her eating healthier. She chose to look into daytime activites that would keep her busy and not stay in all the time watching TV.  She plans to call a recreation center near where she lives. Plan to follow up at group diabetes and weight management meeting later this month.

## 2015-06-16 ENCOUNTER — Encounter: Payer: Self-pay | Admitting: *Deleted

## 2015-06-19 ENCOUNTER — Ambulatory Visit (INDEPENDENT_AMBULATORY_CARE_PROVIDER_SITE_OTHER): Payer: Medicaid Other | Admitting: Dietician

## 2015-06-19 ENCOUNTER — Encounter: Payer: Self-pay | Admitting: Dietician

## 2015-06-19 VITALS — Wt 271.1 lb

## 2015-06-19 DIAGNOSIS — E119 Type 2 diabetes mellitus without complications: Secondary | ICD-10-CM

## 2015-06-19 DIAGNOSIS — Z713 Dietary counseling and surveillance: Secondary | ICD-10-CM | POA: Diagnosis not present

## 2015-06-19 DIAGNOSIS — E1142 Type 2 diabetes mellitus with diabetic polyneuropathy: Secondary | ICD-10-CM

## 2015-06-19 NOTE — Progress Notes (Signed)
Diabetes Self-Management Education  Visit Type:  Follow-up  Appt. Start Time: 1030 Appt. End Time: 1130  06/19/2015  [name], identified by name and date of birth, is a 55 y.o. female with a diagnosis of Diabetes:  Marland Kitchen Type 2 diabetes  Attended a group diabetes and weight management meeting today  ASSESSMENT  Weight 271 lb 1.6 oz (122.97 kg), last menstrual period 02/19/2011. Body mass index is 47.26 kg/(m^2).       Diabetes Self-Management Education - 06/19/15 1300    Health Coping   How would you rate your overall health? Good   Psychosocial Assessment   Patient Belief/Attitude about Diabetes Motivated to manage diabetes   Self-care barriers Low literacy;Lack of material resources   Self-management support Doctor's office;CDE visits   Patient Concerns Nutrition/Meal planning;Weight Control   Special Needs Simplified materials   Preferred Learning Style No preference indicated   Learning Readiness Ready   Exercise   Exercise Type ADL's;Light (walking / raking leaves)   Patient Education   Personal strategies to promote health Helped patient develop diabetes management plan for weight loss and making healthier choices   Individualized Goals (developed by patient)   Nutrition Eat only healthy snacks and regular sized fast food meals   Physical Activity 30 minutes per day   Patient Self-Evaluation of Goals - Patient rates self as meeting previously set goals (% of time)   Nutrition 50 - 75 %   Physical Activity 25 - 50%   Outcomes   Program Status Not Completed   Subsequent Visit   Since your last visit have you continued or begun to take your medications as prescribed? Yes   Since your last visit have you had your blood pressure checked? No   Since your last visit have you experienced any weight changes? Loss   Weight Loss (lbs) 4   Since your last visit, are you checking your blood glucose at least once a day? No      Learning Objective:  Patient will have a  greater understanding of diabetes self-management. Patient education plan: attend diabetes & weight management meeting next month.   Plan:   Patient Instructions  Thank you for coming to the diabetes and weight management meeting today.   This note confirms that you have been signed up for next month's meeting on July 20th at 10:30 AM     Expected Outcomes:  Demonstrated interest in learning. Expect positive outcomes  Education material provided: Support group flyer  If problems or questions, patient to contact team via:  Phone  Future DSME appointment: - 4-6 wks

## 2015-06-19 NOTE — Patient Instructions (Signed)
Thank you for coming to the diabetes and weight management meeting today.   This note confirms that you have been signed up for next month's meeting on July 20th at 10:30 AM

## 2015-06-24 ENCOUNTER — Telehealth: Payer: Self-pay

## 2015-06-24 NOTE — Telephone Encounter (Addendum)
[name] is a 55 y.o. female who was contacted via telephone for monitoring of rivaroxaban (Xarelto) therapy.    ASSESSMENT Indication(s): AFlutter  Duration: indefinite  Labs:    Component Value Date/Time   AST 17 06/25/2013 0700   ALT 12 06/25/2013 0700   NA 140 02/18/2015 1512   NA 137 07/01/2014 1459   K 4.3 02/18/2015 1512   CL 101 02/18/2015 1512   CO2 21 02/18/2015 1512   GLUCOSE 69 02/18/2015 1512   GLUCOSE 141* 07/01/2014 1459   HGBA1C 6.2 05/13/2015 1425   HGBA1C 13.5* 10/14/2010 1041   BUN 13 02/18/2015 1512   BUN 19 07/01/2014 1459   CREATININE 0.64 02/18/2015 1512   CREATININE 0.68 05/17/2013 1453   CALCIUM 8.9 02/18/2015 1512   GFRNONAA 102 02/18/2015 1512   GFRNONAA >89 05/17/2013 1453   GFRAA 118 02/18/2015 1512   GFRAA >89 05/17/2013 1453   WBC 9.4 03/21/2015 1445   WBC 8.7 07/22/2014 1517   HGB 8.8* 07/22/2014 1517   HCT 38.3 03/21/2015 1445   HCT 29.9* 07/22/2014 1517   PLT 539* 03/21/2015 1445   PLT 559* 07/22/2014 1517    rivaroxaban (Xarelto) Dose: 20 mg daily  Safety: Patient has not had recent bleeding/thromboembolic events. Patient reports some bruising the past couple of months of arms/legs but after talking to her seems to be when she bumps up against objects, no signs of symptoms of thromboembolism. Medication changes: no.  Adherence: Patient reports no known adherence challenges. Patient does correctly recite the dose. Contacted pharmacy and records indicate refills are fairly consistent. Last fills: 06/06/15, 05/04/15, 04/06/15.  Patient Instructions: Patient advised to contact clinic or seek medical attention if signs/symptoms of bleeding or thromboembolism occur. Patient verbalized understanding by repeating back information.  Follow-up Recommended labs to consider: LFTs . Next appointment 07/24/15 for follow-up.  Angelena Form PharmD Candidate  06/24/2015, 2:54 PM

## 2015-07-01 ENCOUNTER — Encounter (HOSPITAL_COMMUNITY): Payer: Self-pay | Admitting: Internal Medicine

## 2015-07-02 ENCOUNTER — Encounter (HOSPITAL_COMMUNITY): Payer: Self-pay | Admitting: Internal Medicine

## 2015-07-02 ENCOUNTER — Ambulatory Visit (HOSPITAL_COMMUNITY)
Admission: RE | Admit: 2015-07-02 | Discharge: 2015-07-02 | Disposition: A | Discharge status: Home / Self Care | Payer: Medicaid Other | Source: Ambulatory Visit | Attending: Internal Medicine | Admitting: Internal Medicine

## 2015-07-02 VITALS — BP 108/60 | HR 87 | Ht 63.5 in | Wt 265.0 lb

## 2015-07-02 DIAGNOSIS — I272 Other secondary pulmonary hypertension: Secondary | ICD-10-CM | POA: Insufficient documentation

## 2015-07-02 DIAGNOSIS — D509 Iron deficiency anemia, unspecified: Secondary | ICD-10-CM | POA: Insufficient documentation

## 2015-07-02 DIAGNOSIS — Z7901 Long term (current) use of anticoagulants: Secondary | ICD-10-CM | POA: Diagnosis not present

## 2015-07-02 DIAGNOSIS — I110 Hypertensive heart disease with heart failure: Secondary | ICD-10-CM | POA: Diagnosis not present

## 2015-07-02 DIAGNOSIS — Z7984 Long term (current) use of oral hypoglycemic drugs: Secondary | ICD-10-CM | POA: Insufficient documentation

## 2015-07-02 DIAGNOSIS — G4733 Obstructive sleep apnea (adult) (pediatric): Secondary | ICD-10-CM | POA: Diagnosis not present

## 2015-07-02 DIAGNOSIS — F329 Major depressive disorder, single episode, unspecified: Secondary | ICD-10-CM | POA: Insufficient documentation

## 2015-07-02 DIAGNOSIS — E119 Type 2 diabetes mellitus without complications: Secondary | ICD-10-CM | POA: Insufficient documentation

## 2015-07-02 DIAGNOSIS — I5032 Chronic diastolic (congestive) heart failure: Secondary | ICD-10-CM | POA: Insufficient documentation

## 2015-07-02 DIAGNOSIS — N289 Disorder of kidney and ureter, unspecified: Secondary | ICD-10-CM | POA: Diagnosis not present

## 2015-07-02 DIAGNOSIS — J449 Chronic obstructive pulmonary disease, unspecified: Secondary | ICD-10-CM | POA: Insufficient documentation

## 2015-07-02 DIAGNOSIS — K219 Gastro-esophageal reflux disease without esophagitis: Secondary | ICD-10-CM | POA: Diagnosis not present

## 2015-07-02 DIAGNOSIS — I4892 Unspecified atrial flutter: Secondary | ICD-10-CM | POA: Diagnosis not present

## 2015-07-02 DIAGNOSIS — E785 Hyperlipidemia, unspecified: Secondary | ICD-10-CM | POA: Diagnosis not present

## 2015-07-02 DIAGNOSIS — F79 Unspecified intellectual disabilities: Secondary | ICD-10-CM | POA: Diagnosis not present

## 2015-07-02 DIAGNOSIS — Z79899 Other long term (current) drug therapy: Secondary | ICD-10-CM | POA: Diagnosis not present

## 2015-07-02 NOTE — Addendum Note (Signed)
Encounter addended by: Effie Berkshire, RN on: 07/02/2015  2:31 PM<BR>     Documentation filed: Patient Instructions Section

## 2015-07-02 NOTE — Progress Notes (Signed)
[name], female   DOB: 04-12-1960, 55 y.o.   MRN: [id number]    [name], female   DOB: January 24, 1960, 55 y.o.    [facility] Cardiologist: Dr Kelby Aline    HPI: [name] is a 55 y.o. 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   She was admitted to [facility] in July 2014 with respiratory failure and RHC showed severe PAH with pulmonary pressures in the 90s. She was transferred to [facility] where she was started on IV Remodulin with excellent results.  She was admitted again 08/16/12 for CP and tachycardia, was found to be in atypical flutter vs atrial tach. Amiodarone was considered but she converted spontaneously. She was started on Xarelto. Her discharge weight was 215 lbs.    Had CP in 11/15 and Myoview with EF 78% with reversible defect in distal anterior wall and apex. (ischemia vs breast attenuation) EF 78%  She returns today for follow up.  Saw Dr. Gilles Chiquito at [facility] in March 2017. Has gained 60 pounds in 2 years.  Encouraged weight loss.  Currently on Revatio 20 tid and Remodulin was raised to 45. Overall feeling ok. Weight at home 264 pounds which is up another 7-8 pounds from a few months ago. Occasionally dizzy when stands up..Continue 3-4 liters oxygen . Taking all medications.    Studies:  Cath 2/16: Minimal CAD LAD 30% otherwise normal RHC on Remodulin RA = 8 RV = 45/5/8 PA = 52/24 (34) PCW = 11 Fick cardiac output/index = 7.6/3.6 PVR = 3.0 WU FA sat = 99% PA sat = 70%, 71%  ECHO 08/11/12 EF 60-65% RV moderately dilated Peak PA pressure 48 mmhg  ECHO 02/28/13 EF 55-60% Grade I DD RV severely dilated and HK. Trivial TR. Septum flat Peak PA pressure 53 mmhg ECHO 11/15 EF 55-60% RV normal Trivial TR ECHO 1/16 at  [facility] EF >55% RV mildly dilated (improved function) Trivial TR normal IVC ECHO 11/2014: EF 55-60%.  RV modrately dilated. Peak PA pressure 78 mm hg ECHO 03/2015: At [facility] RV severely dilated. EF >55% RVSP 74mHG  [facility] air sat was 96% sats fell briefly to 89 at the very end of the walk she went 341 m this is 82% predicted    Labs        09/29/12: K+ 3.6, Creatinine 0.87       02/12/13 K 3.4 Creatinine 0.67 ---> given extra potassium and started on 12.5 mg spironolactone.         05/17/2013  K 4.3 Creatinine 0.68        11/15  K 3.4 Cr 0.67       2/16    K 4.1 Cr 0.78       9/16:   K 3.8 Cr 1.08         02/18/2015: K 4.3 Creatinine 0.64            ROS: All systems negative except as listed in HPI, PMH and Problem List.  Past Medical History  Diagnosis Date  . OSA (obstructive sleep apnea)     CPAP  . Venous stasis ulcer ([place]     chornic, ?followed up at wound care center, multiple courses of antibiotics in past for cellulitis,  on lasix  . GERD (gastroesophageal reflux disease)   . Anemia, iron deficiency     secondary to menhorrhagia, on oral iron, also b12 def, getting monthly b12 shots  . Chronic cough     secondary to alleriges and post nasal drip  . Depression   . Diabetes mellitus     well controlled on metformin  . Hyperlipidemia   . H/O mental retardation   . Herpes   . Cor pulmonale (HCC)     PA Peak pressure 51mHg  . Diastolic heart failure ([facility]   . Restrictive lung disease     PFTs 06/2012 (FVC 54% predicted and FEV1 68% predicted w minimal bronchodilator response).  . CHF (congestive heart failure) ([facility]   . Renal disorder   . Pulmonary hypertension ([place]   . Hypertension   . Shortness of breath   . COPD (chronic obstructive pulmonary disease) ([place]   . Cognitive impairment     Current Outpatient Prescriptions  Medication Sig Dispense Refill  . acetaminophen (TYLENOL) 500 MG tablet Take 1 tablet (500 mg total) by mouth every 6 (six)  hours as needed for mild pain, moderate pain, fever or headache. 30 tablet 0  . cetirizine (ZYRTEC) 10 MG tablet TAKE ONE TABLET BY MOUTH ONCE DAILY 30 tablet 1  . cyanocobalamin 500 MCG tablet Take 500 mcg by mouth daily.    . diphenhydrAMINE (BENADRYL) 25 mg capsule Take 25 mg by mouth every 6 (six) hours as needed for itching.    . docusate sodium (COLACE) 100 MG capsule Take 1 capsule (100 mg total) by mouth 2 (two) times daily as needed for mild constipation. 60 capsule 3  . ferrous sulfate 325 (65 FE) MG tablet Take 1 tablet (325 mg total) by mouth daily with breakfast. 90 tablet 3  . gabapentin (NEURONTIN) 300 MG capsule TAKE TWO CAPSULES BY MOUTH THREE TIMES DAILY 180 capsule 5  . loperamide (IMODIUM) 2 MG capsule Take 2 mg by mouth daily as needed for diarrhea or loose stools.    . metFORMIN (GLUCOPHAGE) 1000 MG tablet Please take 1/2 tablet daily with breakfast 45 tablet 3  . nystatin (MYCOSTATIN/NYSTOP) 100000 UNIT/GM POWD Apply to skin folds affected 2 times daily until redness and irritation resolves. 30 g 0  . pantoprazole (PROTONIX) 20 MG tablet Take 1 tablet (20 mg total) by mouth 2 (two) times daily. 180 tablet 3  . potassium chloride SA (KLOR-CON M20) 20 MEQ tablet TAKE TWO TABLETS BY MOUTH THREE TIMES DAILY, TAKE EXTRA TABLET WHEN TAKING AN EXTRA TORESMIDE 180 tablet 5  . pravastatin (PRAVACHOL) 20 MG tablet Take 1 tablet (20 mg total) by mouth daily. 90 tablet 3  . sildenafil (REVATIO) 20 MG tablet Take 1 tablet (20 mg total) by mouth 3 (three) times daily. 270 tablet 3  . spironolactone (ALDACTONE) 25 MG tablet Take 0.5 tablets (12.5 mg total) by mouth daily. 45 tablet 3  . torsemide (DEMADEX) 20 MG tablet TAKE THREE TABLETS BY MOUTH ONCE DAILY 90 tablet 2  . Vaginal Lubricant (REPLENS) GEL Apply three times weekly to vaginal mucosa 35 g 1  . XARELTO 20 MG TABS tablet TAKE ONE TABLET BY MOUTH ONCE DAILY WITH  SUPPER 30 tablet 5   No current facility-administered medications  for this encounter.    Filed Vitals:   07/02/15 1338  BP: 108/60  Pulse: 87  Height: 5' 3.5" (1.613 m)  Weight: 265 lb (120.203 kg)  SpO2: 97%    Physical Exam:  General:  Morbidly obese. On chronic 3 L O2 used rolling walker to ambulate into clinic.  HEENT: normal  Neck: supple. JVP hard to see doesn't look elevated  Carotids 2+ bilat; no bruits. No lymphadenopathy or thryomegaly appreciated.  Cor: PMI nonpalpable.Regular rate. I/VI TR, increased P2.  Hickman catheter site ok. .  Lungs: clear  Abdomen: obese soft, nontender, nondistended. No hepatosplenomegaly. No bruits or masses. Good bowel sounds.  Extremities: no cyanosis, clubbing, no edema.  Neuro: alert & orientedx3, cranial nerves grossly intact. moves all 4 extremities w/o difficulty. Affect pleasant    ASSESSMENT & PLAN:  1) Chronic diastolic HF/cor pulmonale:  - Recent ECHO at [facility]. PAH stable on remodulin and sildenafil. If orthostasis continues may need to reduce remodulin dose or diuretics slightly.  - Continue torsemide to 60 mg daily. - Continue current meds.  2) PAH, severe with cor pulmonale.  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 However PAH far out of proportion to L-sided pressures or restrictive lung disease. - Overall stable. NYHA II.  Doing well with sildenafill and Remodulin  - Follows with [facility]. Next appointment September 2017.  - Continue current regimen. Referred to pulmonary rehab but medicaid won't pay for it. Marland Kitchen  3) Paroxysmal atrial flutter.  Regular pulse today. Continue Xarelto. 4) Obesity - Encouraged weight loss  Glori Bickers, MD 2:15 PM

## 2015-07-02 NOTE — Patient Instructions (Signed)
Follow up 6 months with Dr. Haroldine Laws.  Do the following things EVERYDAY: 1) Weigh yourself in the morning before breakfast. Write it down and keep it in a log. 2) Take your medicines as prescribed 3) Eat low salt foods-Limit salt (sodium) to 2000 mg per day.  4) Stay as active as you can everyday 5) Limit all fluids for the day to less than 2 liters

## 2015-07-10 ENCOUNTER — Encounter: Payer: Self-pay | Admitting: Obstetrics & Gynecology

## 2015-07-10 ENCOUNTER — Ambulatory Visit (INDEPENDENT_AMBULATORY_CARE_PROVIDER_SITE_OTHER): Payer: Medicaid Other | Admitting: Obstetrics & Gynecology

## 2015-07-10 VITALS — BP 101/60 | HR 83 | Ht 63.5 in | Wt 271.5 lb

## 2015-07-10 DIAGNOSIS — N950 Postmenopausal bleeding: Secondary | ICD-10-CM | POA: Diagnosis present

## 2015-07-10 NOTE — Progress Notes (Signed)
States stopped having periods in 1990's. Then states noticed blood when went to bathroom - last about 3-4 months ago.  One time lasted for about a week- states was like a period.  Referred by PCP. Also has hemorrhoids.  Uses a walker , denies falls.

## 2015-07-10 NOTE — Progress Notes (Signed)
History   Chief Complaint:  Postmenopausal bleeding   [name] is  55 y.o. G1P1001 Patient's last menstrual period was 02/19/2011. She is here today due to AUB on the recommendation of her internal medicine physician. PMH is significant for dCHF, DM2, pulmonary hypertension, hemorrhoids. Patient notes that she had regular menses in the 1980s when she was on birth control. Her periods became irregular following the birth of her son in 54, until they stopped completely in the late 1990s. She states that she did not experience typical menopausal symptoms but described events similar to hot flashes. Her recent bleeding began approximately two years ago as spotting, with occasional bleeding episodes that were as heavy as normal periods. She notes that the bleeding per vagina began due to a severe bout of constipation. Her bleeding episodes are not associated with cramps, cravings or her other typical menstrual symptoms. She is not bleeding today.  Review of Systems  Cardiovascular: Positive for leg swelling.  Gastrointestinal: Positive for constipation.       Hemorrhoids  Musculoskeletal: Negative for falls.  Psychiatric/Behavioral: Negative for memory loss.    Physical Exam   Blood pressure 101/60, pulse 83, height 5' 3.5" (1.613 m), weight 271 lb 8 oz (123.152 kg), last menstrual period 02/19/2011.  Physical Exam  Constitutional: She is oriented to person, place, and time. No distress.  Morbidly obese   Eyes: Conjunctivae are normal. Pupils are equal, round, and reactive to light.  Neck: Normal range of motion.  Pulmonary/Chest: Effort normal. No respiratory distress.  Abdominal: Soft. She exhibits no distension and no mass (difficult to discern due to body habitus). There is tenderness (LLQ). There is guarding (LLQ). There is no rebound.  Genitourinary: Uterus is not deviated and not enlarged. Cervix is not fixed. Cervix exhibits no motion tenderness, no lesion and no tenderness.  Right adnexum displays no tenderness. Left adnexum displays tenderness. Vulva exhibits no erythema, no exudate, no lesion, no rash (atrophic) and no tenderness. Vagina exhibits no exudate, no lesion and no rugosity. No vaginal discharge found.  Musculoskeletal: She exhibits edema (bilateral lower extremities).  Neurological: She is alert and oriented to person, place, and time.  Skin: Skin is warm and dry. She is not diaphoretic.  Psychiatric: Memory and affect normal.   Labs: Orosi to be collected  Assessment and Plan: Abnormal uterine bleeding - Patient to monitor uterine bleeding until next visit - [facility] - Return precautions provided.   Return in about 3 weeks (around 07/31/2015).    Marya Amsler, PA-S 07/10/2015, 2:32 PM

## 2015-07-10 NOTE — Patient Instructions (Signed)
Perimenopause Perimenopause is the time when your body begins to move into the menopause (no menstrual period for 12 straight months). It is a natural process. Perimenopause can begin 2-8 years before the menopause and usually lasts for 1 year after the menopause. During this time, your ovaries may or may not produce an egg. The ovaries vary in their production of estrogen and progesterone hormones each month. This can cause irregular menstrual periods, difficulty getting pregnant, vaginal bleeding between periods, and uncomfortable symptoms. CAUSES  Irregular production of the ovarian hormones, estrogen and progesterone, and not ovulating every month.  Other causes include:  Tumor of the pituitary gland in the brain.  Medical disease that affects the ovaries.  Radiation treatment.  Chemotherapy.  Unknown causes.  Heavy smoking and excessive alcohol intake can bring on perimenopause sooner. SIGNS AND SYMPTOMS   Hot flashes.  Night sweats.  Irregular menstrual periods.  Decreased sex drive.  Vaginal dryness.  Headaches.  Mood swings.  Depression.  Memory problems.  Irritability.  Tiredness.  Weight gain.  Trouble getting pregnant.  The beginning of losing bone cells (osteoporosis).  The beginning of hardening of the arteries (atherosclerosis). DIAGNOSIS  Your health care provider will make a diagnosis by analyzing your age, menstrual history, and symptoms. He or she will do a physical exam and note any changes in your body, especially your female organs. Female hormone tests may or may not be helpful depending on the amount of female hormones you produce and when you produce them. However, other hormone tests may be helpful to rule out other problems. TREATMENT  In some cases, no treatment is needed. The decision on whether treatment is necessary during the perimenopause should be made by you and your health care provider based on how the symptoms are affecting you  and your lifestyle. Various treatments are available, such as:  Treating individual symptoms with a specific medicine for that symptom.  Herbal medicines that can help specific symptoms.  Counseling.  Group therapy. HOME CARE INSTRUCTIONS   Keep track of your menstrual periods (when they occur, how heavy they are, how long between periods, and how long they last) as well as your symptoms and when they started.  Only take over-the-counter or prescription medicines as directed by your health care provider.  Sleep and rest.  Exercise.  Eat a diet that contains calcium (good for your bones) and soy (acts like the estrogen hormone).  Do not smoke.  Avoid alcoholic beverages.  Take vitamin supplements as recommended by your health care provider. Taking vitamin E may help in certain cases.  Take calcium and vitamin D supplements to help prevent bone loss.  Group therapy is sometimes helpful.  Acupuncture may help in some cases. SEEK MEDICAL CARE IF:   You have questions about any symptoms you are having.  You need a referral to a specialist (gynecologist, psychiatrist, or psychologist). SEEK IMMEDIATE MEDICAL CARE IF:   You have vaginal bleeding.  Your period lasts longer than 8 days.  Your periods are recurring sooner than 21 days.  You have bleeding after intercourse.  You have severe depression.  You have pain when you urinate.  You have severe headaches.  You have vision problems.   This information is not intended to replace advice given to you by your health care provider. Make sure you discuss any questions you have with your health care provider.   Document Released: 01/29/2004 Document Revised: 01/11/2014 Document Reviewed: 07/20/2012 Elsevier Interactive Patient Education [employer].

## 2015-07-11 LAB — FOLLICLE STIMULATING HORMONE: FSH: 63.6 m[IU]/mL

## 2015-07-24 ENCOUNTER — Encounter: Payer: Self-pay | Admitting: Dietician

## 2015-07-24 ENCOUNTER — Ambulatory Visit (INDEPENDENT_AMBULATORY_CARE_PROVIDER_SITE_OTHER): Payer: Medicaid Other | Admitting: Dietician

## 2015-07-24 ENCOUNTER — Encounter (HOSPITAL_COMMUNITY): Payer: Self-pay | Admitting: Emergency Medicine

## 2015-07-24 ENCOUNTER — Ambulatory Visit (HOSPITAL_COMMUNITY)
Admission: EM | Admit: 2015-07-24 | Discharge: 2015-07-24 | Disposition: A | Discharge status: Home / Self Care | Payer: Medicaid Other | Attending: Emergency Medicine | Admitting: Emergency Medicine

## 2015-07-24 DIAGNOSIS — L02811 Cutaneous abscess of head [any part, except face]: Secondary | ICD-10-CM | POA: Diagnosis not present

## 2015-07-24 DIAGNOSIS — E1142 Type 2 diabetes mellitus with diabetic polyneuropathy: Secondary | ICD-10-CM | POA: Diagnosis not present

## 2015-07-24 MED ORDER — CEPHALEXIN 250 MG PO CAPS: 250.0000 mg | ORAL_CAPSULE | Freq: Four times a day (QID) | ORAL | Status: DC

## 2015-07-24 NOTE — ED Notes (Signed)
Pt felt a bump on the right back of her head about 10 days ago.  She states she heard it pop and had some bleeding from it.  She thought it went away, but then felt some discomfort a few days ago while brushing her hair.  Pt has a small red area on the back of her head, but no bump was palpated.

## 2015-07-24 NOTE — ED Provider Notes (Signed)
CSN: [id number]     Arrival date & time 07/24/15  1304 History   First MD Initiated Contact with Patient 07/24/15 1401     Chief Complaint  Patient presents with  . Abscess   (Consider location/radiation/quality/duration/timing/severity/associated sxs/prior Treatment) HPI History obtained from patient:  Pt presents with the cc of:  Scalp lesion right parietal Duration of symptoms: One week Treatment prior to arrival: Washed and squeezed wound Context: Sudden onset of lesion right side of head after combing her hair she states that she felt a pop and has had a bit of pain in the area since that time. Other symptoms include: Bloody drainage Pain score: 2 FAMILY HISTORY: Hypertension    Past Medical History  Diagnosis Date  . OSA (obstructive sleep apnea)     CPAP  . Venous stasis ulcer ([place])     chornic, ?followed up at wound care center, multiple courses of antibiotics in past for cellulitis, on lasix  . GERD (gastroesophageal reflux disease)   . Anemia, iron deficiency     secondary to menhorrhagia, on oral iron, also b12 def, getting monthly b12 shots  . Chronic cough     secondary to alleriges and post nasal drip  . Depression   . Diabetes mellitus     well controlled on metformin  . Hyperlipidemia   . H/O mental retardation   . Herpes   . Cor pulmonale ([facility])     PA Peak pressure 45mHg  . Diastolic heart failure ([facility]   . Restrictive lung disease     PFTs 06/2012 (FVC 54% predicted and FEV1 68% predicted w minimal bronchodilator response).  . CHF (congestive heart failure) ([facility]   . Renal disorder   . Pulmonary hypertension ([facility]   . Hypertension   . Shortness of breath   . COPD (chronic obstructive pulmonary disease) ([facility]   . Cognitive impairment    Past Surgical History  Procedure Laterality Date  . Cholecystectomy    . Left and right heart catheterization with coronary angiogram N/A 02/28/2014    Procedure: LEFT AND RIGHT HEART CATHETERIZATION WITH CORONARY  ANGIOGRAM;  Surgeon: DJolaine Artist MD;  [facility];  Service: Cardiovascular;  Laterality: N/A;   Family History  Problem Relation Age of Onset  . Mental illness Sister   . Mental retardation Brother    Social History  Substance Use Topics  . Smoking status: Never Smoker   . Smokeless tobacco: Never Used  . Alcohol Use: No   OB History    Gravida Para Term Preterm AB TAB SAB Ectopic Multiple Living   _0 Review of Systems  Denies: HEADACHE, NAUSEA, ABDOMINAL PAIN, CHEST PAIN, CONGESTION, DYSURIA, SHORTNESS OF BREATH  Allergies  Aspirin; Codeine; Lisinopril; and Sulfonamide derivatives  Home Medications   Prior to Admission medications   Medication Sig Start Date End Date Taking? Authorizing Provider  acetaminophen (TYLENOL) 500 MG tablet Take 1 tablet (500 mg total) by mouth every 6 (six) hours as needed for mild pain, moderate pain, fever or headache. 08/16/13  Yes KAntonietta Breach PA-C  cetirizine (ZYRTEC) 10 MG tablet TAKE ONE TABLET BY MOUTH ONCE DAILY 07/04/14  Yes ECorky Sox MD  cyanocobalamin 500 MCG tablet Take 500 mcg by mouth daily.   Yes Historical Provider, MD  diphenhydrAMINE (BENADRYL) 25 mg capsule Take 25 mg by mouth every 6 (six) hours as needed for itching.   Yes Historical Provider,  MD  docusate sodium (COLACE) 100 MG capsule Take 1 capsule (100 mg total) by mouth 2 (two) times daily as needed for mild constipation. 07/23/14  Yes Corky Sox, MD  ferrous sulfate 325 (65 FE) MG tablet Take 1 tablet (325 mg total) by mouth daily with breakfast. 07/23/14  Yes Corky Sox, MD  gabapentin (NEURONTIN) 300 MG capsule TAKE TWO CAPSULES BY MOUTH THREE TIMES DAILY 05/26/15  Yes Corky Sox, MD  loperamide (IMODIUM) 2 MG capsule Take 2 mg by mouth daily as needed for diarrhea or loose stools.   Yes Historical Provider, MD  metFORMIN (GLUCOPHAGE) 1000 MG tablet Please take 1/2 tablet daily with breakfast 10/05/14  Yes Corky Sox, MD   pantoprazole (PROTONIX) 20 MG tablet Take 1 tablet (20 mg total) by mouth 2 (two) times daily. 10/05/14  Yes Corky Sox, MD  potassium chloride SA (KLOR-CON M20) 20 MEQ tablet TAKE TWO TABLETS BY MOUTH THREE TIMES DAILY, TAKE EXTRA TABLET WHEN TAKING AN EXTRA TORESMIDE 05/13/15  Yes Corky Sox, MD  pravastatin (PRAVACHOL) 20 MG tablet Take 1 tablet (20 mg total) by mouth daily. 10/05/14  Yes Corky Sox, MD  sildenafil (REVATIO) 20 MG tablet Take 1 tablet (20 mg total) by mouth 3 (three) times daily. 10/08/14  Yes Jolaine Artist, MD  spironolactone (ALDACTONE) 25 MG tablet Take 0.5 tablets (12.5 mg total) by mouth daily. 10/05/14  Yes Corky Sox, MD  torsemide (DEMADEX) 20 MG tablet TAKE THREE TABLETS BY MOUTH ONCE DAILY 05/06/15  Yes Corky Sox, MD  XARELTO 20 MG TABS tablet TAKE ONE TABLET BY MOUTH ONCE DAILY WITH  SUPPER 04/07/15  Yes Corky Sox, MD  cephALEXin (KEFLEX) 250 MG capsule Take 1 capsule (250 mg total) by mouth 4 (four) times daily. 07/24/15   Konrad Felix, PA  nystatin (MYCOSTATIN/NYSTOP) 100000 UNIT/GM POWD Apply to skin folds affected 2 times daily until redness and irritation resolves. Patient not taking: Reported on 07/10/2015 02/18/15   Corky Sox, MD  Vaginal Lubricant (REPLENS) GEL Apply three times weekly to vaginal mucosa 05/13/15   Corky Sox, MD   Meds Ordered and Administered this Visit  Medications - No data to display  BP 132/70 mmHg  Pulse 86  Temp(Src) 98.5 F (36.9 C) (Oral)  Resp 16  SpO2 95%  LMP 02/19/2011 No data found.   Physical Exam NURSES NOTES AND VITAL SIGNS REVIEWED. CONSTITUTIONAL: Well developed, well nourished, no acute distress HEENT: normocephalic, atraumatic, LESS THAN 1 CM RED LESION RIGHT parietal scalp. Nontender to palpation. No actual drainage at this time. EYES: Conjunctiva normal NECK:normal ROM, supple, no adenopathy PULMONARY:No respiratory distress, normal effort ABDOMINAL: Soft, ND, NT BS+, No CVAT MUSCULOSKELETAL:  Normal ROM of all extremities,  SKIN: warm and dry without rash PSYCHIATRIC: Mood and affect, behavior are normal  ED Course  Procedures (including critical care time)  Labs Review Labs Reviewed - No data to display  Imaging Review No results found.   Visual Acuity Review  Right Eye Distance:   Left Eye Distance:   Bilateral Distance:    Right Eye Near:   Left Eye Near:    Bilateral Near:     Prescription for Keflex is provided advised to follow-up with primary care provider  MDM   1. Scalp abscess     Patient is reassured that there are no issues that require transfer to higher level of care at this time or additional tests. Patient  is advised to continue home symptomatic treatment. Patient is advised that if there are new or worsening symptoms to attend the emergency department, contact primary care provider, or return to UC. Instructions of care provided discharged home in stable condition.    THIS NOTE WAS GENERATED USING A VOICE RECOGNITION SOFTWARE PROGRAM. ALL REASONABLE EFFORTS  WERE MADE TO PROOFREAD THIS DOCUMENT FOR ACCURACY.  I have verbally reviewed the discharge instructions with the patient. A printed AVS was given to the patient.  All questions were answered prior to discharge.      Konrad Felix, PA 07/24/15 1416

## 2015-07-24 NOTE — Progress Notes (Addendum)
Diabetes Self-Management Education  Visit Type:  Follow-up  Appt. Start Time: 10:30 AM Appt. End Time: 11:30 AM  07/24/2015  [name], identified by name and date of birth, is a 55 y.o. female with a diagnosis of Diabetes:  Marland Kitchen  Type 2 diabetes ASSESSMENT  Last menstrual period 02/19/2011. There is no weight reported today was 274# she is excited about starting an exercise program ([facility]/pulomonary rehab) and asks for test strips to check her blood sugar. Patient participated ina group visit today with 2 others. She learned about healthier and easier options for preparing food, set a goal for the upcoming month to work on self rated herself on last months goal a 2/3 indicating that she did it some to half of the time.    Goal for upcoming month : change my snacks some and my meals      Diabetes Self-Management Education - 07/24/15 1300    Health Coping   How would you rate your overall health? Good   Psychosocial Assessment   Patient Belief/Attitude about Diabetes Motivated to manage diabetes   Self-care barriers Low literacy;Unsteady gait/risk for falls   Self-management support Doctor's office;CDE visits   Patient Concerns Nutrition/Meal planning;Healthy Lifestyle;Problem Solving;Weight Control;Support   Special Needs Simplified materials   Preferred Learning Style No preference indicated   Learning Readiness Ready   Pre-Education Assessment   Patient understands incorporating nutritional management into lifestyle. Demonstrates understanding / competency   Patient undertands incorporating physical activity into lifestyle. Demonstrates understanding / competency   Patient understands how to develop strategies to promote health/change behavior. Needs Review   Complications   Last HgB A1C per patient/outside source 6.2 %   How often do you check your blood sugar? 0 times/day (not testing), wants to begin testing when she starts exercise program   Have you had a dilated eye  exam in the past 12 months? Yes   Exercise   Exercise Type ADL's   Patient Education   Nutrition management  Per her goal - make healthier snack choices   Personal strategies to promote health Helped patient develop diabetes management plan for making lifestyle changes eating healthier for weight loss and increasing her activity.    Individualized Goals (developed by patient)   Nutrition Follow meal plan discussed- low salt, increased fruit and vegetables   Physical Activity 30 minutes per day   Patient Self-Evaluation of Goals - Patient rates self as meeting previously set goals (% of time)   Nutrition 25 - 50%   Physical Activity < 25%   Outcomes   Program Status Not Completed   Subsequent Visit   Since your last visit have you continued or begun to take your medications as prescribed? Yes   Since your last visit have you had your blood pressure checked? Yes   Is your most recent blood pressure lower, unchanged, or higher since your last visit? Lower   Since your last visit have you experienced any weight changes? Gain   Weight Gain (lbs) --  3# weight gain per patient   Since your last visit, are you checking your blood glucose at least once a day? No      Learning Objective:  Patient will have a greater understanding of diabetes self-management. Patient education plan is to attend individual and/or group sessions per assessed needs and concerns.   Plan:   There are no Patient Instructions on file for this visit.   Expected Outcomes:  Demonstrated interest in learning. Expect positive outcomes  Education material provided: No sodium seasonings  If problems or questions, patient to contact team via:  Phone  Future DSME appointment: - 2 months

## 2015-07-24 NOTE — Discharge Instructions (Signed)
Abscess An abscess (boil or furuncle) is an infected area on or under the skin. This area is filled with yellowish-white fluid (pus) and other material (debris). HOME CARE   Only take medicines as told by your doctor.  If you were given antibiotic medicine, take it as directed. Finish the medicine even if you start to feel better.  If gauze is used, follow your doctor's directions for changing the gauze.  To avoid spreading the infection:  Keep your abscess covered with a bandage.  Wash your hands well.  Do not share personal care items, towels, or whirlpools with others.  Avoid skin contact with others.  Keep your skin and clothes clean around the abscess.  Keep all doctor visits as told. GET HELP RIGHT AWAY IF:   You have more pain, puffiness (swelling), or redness in the wound site.  You have more fluid or blood coming from the wound site.  You have muscle aches, chills, or you feel sick.  You have a fever. MAKE SURE YOU:   Understand these instructions.  Will watch your condition.  Will get help right away if you are not doing well or get worse.   This information is not intended to replace advice given to you by your health care provider. Make sure you discuss any questions you have with your health care provider.   Document Released: 06/09/2007 Document Revised: 06/22/2011 Document Reviewed: 03/06/2011 Elsevier Interactive Patient Education [employer].

## 2015-07-24 NOTE — Patient Instructions (Signed)
Cooking With Less Salt Cooking with less salt is one way to reduce the amount of sodium you get from food. Sodium raises blood pressure and causes water to be held in the body. Getting less sodium from food may help lower your blood pressure, reduce any swelling, and protect your heart, liver, and kidneys.   WHAT DO I NEED TO KNOW ABOUT COOKING WITH LESS SALT?  Buy sodium-free or low-sodium products. Look on the label for the words:   Lower-sodium.  Sodium-free.   Sodium-reduced.   No salt added.   Unsalted.  Check the food label before using or buying packaged ingredients.  Look for products with LESS than 150 mg of sodium in one serving.  Do not choose foods with salt as one of the first three ingredients on the ingredients list. If salt is one of the first three ingredients, it usually means the item is high in sodium because ingredients are listed in order of amount in the food item.  Use herbs, seasonings without salt, and spices as substitutes for salt in foods.  Use sodium-free baking soda when baking.  WHAT ARE SOME SALT ALTERNATIVES? The following are herbs, seasonings, and spices that can be used instead of salt to give taste to your food. Next to their names are foods they can be used to flavor.  Herbs  Bay-Soups, meat and vegetable dishes, and spaghetti sauce.   Basil-Italian dishes, soups, pasta, and fish dishes.   Cilantro-Meat, poultry, and vegetable dishes.   Chili Powder-Marinades and Mexican dishes.   Chives-Salad dressings and potato dishes.   Cumin-Mexican dishes, couscous, and meat dishes.   Dill-Fish dishes, sauces, and salads.   Fennel-Meat and vegetable dishes, breads, and cookies.   Garlic (do not use garlic salt)-Italian dishes, meat dishes, salad dressings, and sauces.   Marjoram-Soups, potato dishes, and meat dishes.   Oregano-Pizza and spaghetti sauce.   Parsley-Salads, soups, pasta, and meat dishes.    Rosemary-Italian dishes, salad dressings, soups, and red meats.   Saffron-Fish dishes, pasta, and some poultry dishes.   Sage-Stuffings and sauces.   Tarragon-Fish and [employer].   Thyme-Stuffing, meat, and fish dishes.  Herbs should be fresh or dried. Do not choose packaged mixes.  Seasonings  Lemon juice-Fish dishes, poultry dishes, vegetables, and salads.   Vinegar-Salad dressings, vegetables, and fish dishes.  Spices  Cinnamon-Sweet dishes (such as cakes, cookies, and puddings).   Cloves-Gingerbread, puddings, and marinades for meats.   Curry-Vegetable dishes, fish and poultry dishes, and stir-fry dishes.   Ginger-Vegetables dishes, fish dishes, and stir-fry dishes.   Nutmeg-Pasta, vegetables, poultry, fish dishes, and custard.   WHAT ARE SOME LOW-SODIUM INGREDIENTS AND FOODS?  Fresh or frozen fruits and vegetables with no sauce added.  Fresh or frozen whole meats, poultry, and fish with no sauce added.  Eggs.  Noodles, pasta, quinoa, rice.  Shredded or puffed wheat or puffed rice.  Regular or quick oats.  Milk, yogurt, low-sodium cheeses and hard cheeses (such as cheddar, [employer], or mozzarella). Always check the label for serving size and sodium content.  Unsalted butter or margarine.  Unsalted nuts.  Sherbet or ice cream (keep to  cup serving).  Homemade pudding.  Sodium-free baking soda and baking powder.   This is not a complete list of low-sodium ingredients and foods. Contact your dietitian for more options.  WHAT HIGH-SODIUM INGREDIENTS ARE NOT RECOMMENDED?  Sauces, such as mustard, barbecue sauce, soy sauce, teriyaki sauce, steak sauce, chili sauce, cocktail sauce, and tartar sauce.  Mixes, such as flavored rice.  Instant products, such as ready-made pasta.  Horseradish.  Salsa.   Packaged gravies.   Angie Fava.  Olives.  Sauerkraut.  Salted nuts.   Cured or smoked meats (such as hot dogs, bacon,  salami, ham, and bologna).   Processed vegetable juices, such as tomato juice.  Buttermilk.  Processed cheeses (such as cheese dips or cheese spread).  Cottage cheese.  Instant hot cereals.  Dessert mixes (ready-to-make) and store-bought cakes and pies.  Crackers with salted tops.  This is not a complete list of high-sodium ingredients. Contact your dietitian for more options.

## 2015-07-25 ENCOUNTER — Other Ambulatory Visit: Payer: Self-pay | Admitting: Dietician

## 2015-07-25 DIAGNOSIS — E1142 Type 2 diabetes mellitus with diabetic polyneuropathy: Secondary | ICD-10-CM

## 2015-07-25 MED ORDER — GLUCOSE BLOOD VI STRP: ORAL_STRIP | Status: DC

## 2015-07-25 NOTE — Telephone Encounter (Signed)
She was asked to check her blood sugar for rehab exercise class.

## 2015-07-31 ENCOUNTER — Encounter: Payer: Self-pay | Admitting: Obstetrics & Gynecology

## 2015-07-31 ENCOUNTER — Ambulatory Visit (INDEPENDENT_AMBULATORY_CARE_PROVIDER_SITE_OTHER): Payer: Medicaid Other | Admitting: Obstetrics & Gynecology

## 2015-07-31 VITALS — BP 113/66 | HR 85 | Wt 263.1 lb

## 2015-07-31 DIAGNOSIS — D259 Leiomyoma of uterus, unspecified: Secondary | ICD-10-CM

## 2015-07-31 DIAGNOSIS — N950 Postmenopausal bleeding: Secondary | ICD-10-CM

## 2015-07-31 NOTE — Progress Notes (Signed)
Subjective:     [name], female   DOB: [birth year], 55 y.o.   MRN: [id number] Cc: f/u for possible postmenopausal bleeding HPI  [id number] Patient's last menstrual period was 02/19/2011. She had had irregular bleeding and was sent to evaluate for postmenopausal bleeding. No bleeding for the last 2-3 months and no pain Current Outpatient Prescriptions on File Prior to Visit  Medication Sig Dispense Refill  . acetaminophen (TYLENOL) 500 MG tablet Take 1 tablet (500 mg total) by mouth every 6 (six) hours as needed for mild pain, moderate pain, fever or headache. 30 tablet 0  . cephALEXin (KEFLEX) 250 MG capsule Take 1 capsule (250 mg total) by mouth 4 (four) times daily. 28 capsule 0  . cetirizine (ZYRTEC) 10 MG tablet TAKE ONE TABLET BY MOUTH ONCE DAILY 30 tablet 1  . cyanocobalamin 500 MCG tablet Take 500 mcg by mouth daily.    . diphenhydrAMINE (BENADRYL) 25 mg capsule Take 25 mg by mouth every 6 (six) hours as needed for itching.    . docusate sodium (COLACE) 100 MG capsule Take 1 capsule (100 mg total) by mouth 2 (two) times daily as needed for mild constipation. 60 capsule 3  . ferrous sulfate 325 (65 FE) MG tablet Take 1 tablet (325 mg total) by mouth daily with breakfast. 90 tablet 3  . gabapentin (NEURONTIN) 300 MG capsule TAKE TWO CAPSULES BY MOUTH THREE TIMES DAILY 180 capsule 5  . glucose blood (ACCU-CHEK AVIVA PLUS) test strip Use  To check blood sugar up to 1 time a day 50 each 5  . loperamide (IMODIUM) 2 MG capsule Take 2 mg by mouth daily as needed for diarrhea or loose stools.    . metFORMIN (GLUCOPHAGE) 1000 MG tablet Please take 1/2 tablet daily with breakfast 45 tablet 3  . nystatin (MYCOSTATIN/NYSTOP) 100000 UNIT/GM POWD Apply to skin folds affected 2 times daily until redness and irritation resolves. 30 g 0  . pantoprazole (PROTONIX) 20 MG tablet Take 1 tablet (20 mg total) by mouth 2 (two) times daily. 180 tablet 3  . potassium chloride SA (KLOR-CON M20) 20 MEQ  tablet TAKE TWO TABLETS BY MOUTH THREE TIMES DAILY, TAKE EXTRA TABLET WHEN TAKING AN EXTRA TORESMIDE 180 tablet 5  . pravastatin (PRAVACHOL) 20 MG tablet Take 1 tablet (20 mg total) by mouth daily. 90 tablet 3  . sildenafil (REVATIO) 20 MG tablet Take 1 tablet (20 mg total) by mouth 3 (three) times daily. 270 tablet 3  . spironolactone (ALDACTONE) 25 MG tablet Take 0.5 tablets (12.5 mg total) by mouth daily. 45 tablet 3  . torsemide (DEMADEX) 20 MG tablet TAKE THREE TABLETS BY MOUTH ONCE DAILY 90 tablet 2  . Vaginal Lubricant (REPLENS) GEL Apply three times weekly to vaginal mucosa 35 g 1  . XARELTO 20 MG TABS tablet TAKE ONE TABLET BY MOUTH ONCE DAILY WITH  SUPPER 30 tablet 5   No current facility-administered medications on file prior to visit.    Allergies  Allergen Reactions  . Aspirin     REACTION: airway swelling  . Codeine     REACTION: tingling in lips and hard breathing - had reaction at dentist - states "I can't take certain kinds of codeine" - happened maybe 10 yr ago  . Lisinopril     Cough  . Sulfonamide Derivatives     REACTION: airway swelling      Review of Systems  Gastrointestinal: Negative.   Genitourinary: Negative for pelvic pain, urgency, vaginal  bleeding and vaginal discharge.       Objective:   Physical Exam  Constitutional: She appears well-developed. No distress.  Pulmonary/Chest: Effort normal.  Psychiatric: She has a normal mood and affect. Her behavior is normal.  Vitals reviewed.  Blood pressure 113/66, pulse 85, weight 263 lb 1.6 oz (119.3 kg), last menstrual period 02/19/2011. [place] result reviewed    Assessment:     Recent postmenopausal vs perimenopausal bleeding on Xarelto   FSH elevated Fibroid uterus, endometrium no t visualized on [place] Plan:     RTC 6 mo to review progress, report if vaginal bleeding resumes    Woodroe Mode, MD 07/31/2015

## 2015-07-31 NOTE — Telephone Encounter (Signed)
Patient called asking about her test strips. Informed her that the prescription had been sent to [facility] on [facility]. She agreed to call us back if there is any problem getting them.

## 2015-08-03 ENCOUNTER — Other Ambulatory Visit: Payer: Self-pay | Admitting: Internal Medicine

## 2015-08-06 ENCOUNTER — Encounter (HOSPITAL_COMMUNITY)
Admission: RE | Admit: 2015-08-06 | Discharge: 2015-08-06 | Disposition: A | Discharge status: Home / Self Care | Payer: Self-pay | Source: Ambulatory Visit | Attending: Internal Medicine | Admitting: Internal Medicine

## 2015-08-06 DIAGNOSIS — Z9889 Other specified postprocedural states: Secondary | ICD-10-CM | POA: Insufficient documentation

## 2015-08-06 DIAGNOSIS — I5032 Chronic diastolic (congestive) heart failure: Secondary | ICD-10-CM | POA: Insufficient documentation

## 2015-08-08 ENCOUNTER — Encounter: Payer: Self-pay | Admitting: Internal Medicine

## 2015-08-08 ENCOUNTER — Encounter (HOSPITAL_COMMUNITY)
Admission: RE | Admit: 2015-08-08 | Discharge: 2015-08-08 | Disposition: A | Discharge status: Home / Self Care | Payer: Self-pay | Source: Ambulatory Visit | Attending: Internal Medicine | Admitting: Internal Medicine

## 2015-08-08 ENCOUNTER — Ambulatory Visit (INDEPENDENT_AMBULATORY_CARE_PROVIDER_SITE_OTHER): Payer: Medicaid Other | Admitting: Internal Medicine

## 2015-08-08 DIAGNOSIS — L989 Disorder of the skin and subcutaneous tissue, unspecified: Secondary | ICD-10-CM

## 2015-08-08 NOTE — Progress Notes (Signed)
Incomplete Session Note  Patient Details  Name: [name] MRN: [id number] Date of Birth: April 13, 1960 Referring Provider:    [name] did not complete her rehab session. [name] presented to pulmonary rehab maintenance denying complaints. Today was [name]'s second exercise day. Check-in vitals as follows: BP 100/60, HR 88, O2 Sat on RA 90. Ambulation vitals: BP 120/80, HR 110, O2 Sat on RA 83. Patient questioned why she was not wearing her oxygen. She states if she wears it now she will not have enough for later. O2 placed and saturations improved to high 90s. In the mean time while ambulating patient became flushed, stated she did not feel well, became nauseated, vomited small amt, tachycardic by palpation of radial pulse. She was seated and placed on cardiac monitor. Tachycardia with frequent pacs and occasional pvcs noted, rate 100-145. Patient recovered back to admission baseline with rest. IV remodulin continuously infusing. Darrick Grinder, NP and Dr. Haroldine Laws notified. New order received to continue to allow patient to exercise and monitor symptoms. Dr. Haroldine Laws informed patient was not in the cardiac monitored exercise program. He stated it was OK for patient to exercise unmonitored. Patient counseled on importance of compliance with O2 prescription. Verbalized understanding. Discharge vitals as follows: BP 111/72, HR 81, O2 Sat 94 at rest on RA. [facility] appointment.

## 2015-08-08 NOTE — Progress Notes (Signed)
Medicine attending: Medical history, presenting problems, physical findings, and medications, reviewed with resident physician Dr [name] on the day of the patient visit and I concur with his evaluation and management plan.

## 2015-08-08 NOTE — Assessment & Plan Note (Signed)
Patient is here because 10 days ago she was combing her hair, and the comb got stuck and she pulled it, and after that she noticed a small bump which was bleeding a little. She kept touching it ever day, and she noticed a spot of blood. She then went to the ER, where she was given keflex which she finished the course. She denies further episode of bleeding. She shampoos her hair every 2 days.. She is here as she was concerned if any infection was present. On exam, I see a 0.5 cm well healed scab on the scalp without surrounding erythema or fluctuance.   _reassured the patient that the area had healed -asked her not to vigorously comb her hair and not to touch the area, apply lotion if needed

## 2015-08-08 NOTE — Progress Notes (Signed)
CC: scab on the head HPI: [name] is a 55 y.o. woman with PMH noted below here for scab on her head from ER follow up   Please see Problem List/A&P for the status of the patient's chronic medical problems   Past Medical History:  Diagnosis Date  . Anemia, iron deficiency    secondary to menhorrhagia, on oral iron, also b12 def, getting monthly b12 shots  . CHF (congestive heart failure) ([place])   . Chronic cough    secondary to alleriges and post nasal drip  . Cognitive impairment   . COPD (chronic obstructive pulmonary disease) ([place])   . Cor pulmonale (HCC)    PA Peak pressure 55mHg  . Depression   . Diabetes mellitus    well controlled on metformin  . Diastolic heart failure ([place]   . GERD (gastroesophageal reflux disease)   . H/O mental retardation   . Herpes   . Hyperlipidemia   . Hypertension   . OSA (obstructive sleep apnea)    CPAP  . Pulmonary hypertension ([facility]   . Renal disorder   . Restrictive lung disease    PFTs 06/2012 (FVC 54% predicted and FEV1 68% predicted w minimal bronchodilator response).  . Shortness of breath   . Venous stasis ulcer ([place]    chornic, ?followed up at wound care center, multiple courses of antibiotics in past for cellulitis, on lasix    Review of Systems: Denies fevers, chills Denies rash. Has cor pulmonale so has a IJ? catheter for a medication  Physical Exam: Vitals:   08/08/15 1325  BP: 127/60  Pulse: 90  Temp: 97.9 F (36.6 C)  TempSrc: Oral  SpO2: 94%  Weight: 268 lb 14.4 oz (122 kg)  Height: 5' 3.5" (1.613 m)    General: A&O, in NAD, morbidly obese HEENT: 0.5 cm well healed scab which is non draining. Normal scalp. No lice. CV: RRR, normal s1, s2, no m/r/g Resp: equal and symmetric breath sounds, no wheezing or crackles    Assessment & Plan:   See encounters tab for problem based medical decision making. Patient discussed with Dr. GBeryle Beams

## 2015-08-08 NOTE — Patient Instructions (Signed)
Thank you for your visit today  Please do not comb your hair vigorously, or scratch the area.  You are on xarelto, so you are more prone to bleeding- that is why it was bleeding a little bit when you first noticed it. Currently, there is a scab over it and it is healed .

## 2015-08-11 ENCOUNTER — Encounter (HOSPITAL_COMMUNITY): Payer: Self-pay

## 2015-08-13 ENCOUNTER — Encounter (HOSPITAL_COMMUNITY): Payer: Self-pay

## 2015-08-15 ENCOUNTER — Encounter (HOSPITAL_COMMUNITY): Payer: Self-pay

## 2015-08-18 ENCOUNTER — Encounter (HOSPITAL_COMMUNITY): Payer: Self-pay

## 2015-08-20 ENCOUNTER — Encounter (HOSPITAL_COMMUNITY): Payer: Self-pay

## 2015-08-21 ENCOUNTER — Telehealth: Payer: Self-pay | Admitting: *Deleted

## 2015-08-21 ENCOUNTER — Encounter: Payer: Self-pay | Admitting: Internal Medicine

## 2015-08-21 ENCOUNTER — Ambulatory Visit (INDEPENDENT_AMBULATORY_CARE_PROVIDER_SITE_OTHER): Payer: Medicaid Other | Admitting: Internal Medicine

## 2015-08-21 DIAGNOSIS — R1032 Left lower quadrant pain: Secondary | ICD-10-CM | POA: Diagnosis not present

## 2015-08-21 MED ORDER — ACETAMINOPHEN 500 MG PO TABS: 500.0000 mg | ORAL_TABLET | Freq: Four times a day (QID) | ORAL | 0 refills | Status: DC | PRN

## 2015-08-21 MED ORDER — CYCLOBENZAPRINE HCL 7.5 MG PO TABS: 7.5000 mg | ORAL_TABLET | Freq: Three times a day (TID) | ORAL | 0 refills | Status: DC | PRN

## 2015-08-21 MED ORDER — DICLOFENAC SODIUM 1 % TD GEL: 4.0000 g | Freq: Four times a day (QID) | TRANSDERMAL | 2 refills | Status: DC

## 2015-08-21 NOTE — Telephone Encounter (Signed)
yes 

## 2015-08-21 NOTE — Progress Notes (Signed)
Case discussed with Dr. Tiburcio Pea at the time of the visit. We reviewed the resident's history and exam and pertinent patient test results. I agree with the assessment, diagnosis, and plan of care documented in the resident's note.

## 2015-08-21 NOTE — Progress Notes (Signed)
   CC: musculoskeletal pain HPI: [name] is a 55 y.o. woman with PMH noted below here for 2 day duration of left lower abdominal wall pain   Please see Problem List/A&P for the status of the patient's chronic medical problems   Past Medical History:  Diagnosis Date  . Anemia, iron deficiency    secondary to menhorrhagia, on oral iron, also b12 def, getting monthly b12 shots  . CHF (congestive heart failure) ([place])   . Chronic cough    secondary to alleriges and post nasal drip  . Cognitive impairment   . COPD (chronic obstructive pulmonary disease) ([place])   . Cor pulmonale (HCC)    PA Peak pressure 78mHg  . Depression   . Diabetes mellitus    well controlled on metformin  . Diastolic heart failure ([place]   . GERD (gastroesophageal reflux disease)   . H/O mental retardation   . Herpes   . Hyperlipidemia   . Hypertension   . OSA (obstructive sleep apnea)    CPAP  . Pulmonary hypertension ([facility]   . Renal disorder   . Restrictive lung disease    PFTs 06/2012 (FVC 54% predicted and FEV1 68% predicted w minimal bronchodilator response).  . Shortness of breath   . Venous stasis ulcer ([facility]    chornic, ?followed up at wound care center, multiple courses of antibiotics in past for cellulitis, on lasix    Review of Systems:  Constitutional: Negative for fever, chills, weight loss and malaise/fatigue.  Respiratory: Negative for cough, has SOB due to cor pulmonale Cardiovascular: Negative for chest pain, orthopnea, or PND   Gastrointestinal: Negative for heartburn, nausea, vomiting. No diarrhea MSK: LL abdominal wall pain  Physical Exam: Vitals:   08/21/15 0946  BP: 117/60  Pulse: 92  Temp: 97.8 F (36.6 C)  TempSrc: Oral  SpO2: 91%  Weight: 267 lb 3.2 oz (121.2 kg)  Height: 5' 3.5" (1.613 m)    General: A&O, in NAD CV: RRR, normal s1, s2, no m/r/g, no carotid bruits appreciated Resp: equal and symmetric breath sounds, no wheezing or crackles  Abdomen:  soft, nontender to light and deep palpation, nondistended, +BS MSK: distinct point tenderness in the left lower abdominal area  Assessment & Plan:   See encounters tab for problem based medical decision making. Patient discussed with Dr. KEppie Gibson

## 2015-08-21 NOTE — Patient Instructions (Signed)
Thank you for your visit today  Please use heating pads, tylenol as needed, and use the voltaren gel  Please use the flexeril as needed for the muscle spasm up to 3 times a day

## 2015-08-21 NOTE — Telephone Encounter (Signed)
Pharmacist requesting a VO for DAW for Voltarin Gel for insurance coverage. Verbal order given. Do you agree?

## 2015-08-21 NOTE — Assessment & Plan Note (Addendum)
Patient presents with 2 day duration of left lower abd wall pain. She says that she was sitting, and she got up too fast to use the bathroom and noticed a sharp spasm there and the muscle pain and spasms are continuing to be present. Pain 4/10 and varies to 2/10. She has not used tylenol or NSAIDs. On exam, distinct point tenderness and stiff muscle in LL abdomen  A: abdominal wall muscle spasm, morbid obesity contributing to it.  P -flexeril TID PRN for 5 days -heating pads -tylenol PRN  -voltaren gel -RTC in 2 weeks if unchanged

## 2015-08-22 ENCOUNTER — Telehealth: Payer: Self-pay | Admitting: *Deleted

## 2015-08-22 ENCOUNTER — Encounter (HOSPITAL_COMMUNITY): Payer: Self-pay

## 2015-08-22 NOTE — Telephone Encounter (Signed)
Call to [facility]. Tracks for Prior Authorization for [employer].  Pharmacy will need to run as Brand name Voltaren Gel1%.  Call to [facility] on [address] to inform them. Pharmacy has taken care of. Sander Nephew, RN 08/22/2015 10:30 AM

## 2015-08-25 ENCOUNTER — Encounter (HOSPITAL_COMMUNITY): Payer: Self-pay

## 2015-08-27 ENCOUNTER — Encounter (HOSPITAL_COMMUNITY): Payer: Self-pay

## 2015-08-29 ENCOUNTER — Encounter (HOSPITAL_COMMUNITY): Payer: Self-pay

## 2015-09-01 ENCOUNTER — Encounter (HOSPITAL_COMMUNITY): Payer: Self-pay

## 2015-09-03 ENCOUNTER — Encounter (HOSPITAL_COMMUNITY): Payer: Self-pay

## 2015-09-05 ENCOUNTER — Encounter (HOSPITAL_COMMUNITY): Payer: Self-pay

## 2015-09-10 ENCOUNTER — Encounter (HOSPITAL_COMMUNITY): Payer: Self-pay

## 2015-09-12 ENCOUNTER — Encounter (HOSPITAL_COMMUNITY): Payer: Self-pay

## 2015-09-12 ENCOUNTER — Telehealth: Payer: Self-pay | Admitting: Dietician

## 2015-09-12 NOTE — Telephone Encounter (Signed)
I called pt as a f/u call; no answer; left message to give [place] a call back.

## 2015-09-12 NOTE — Telephone Encounter (Signed)
Wants to come to group diabetes and nutrition class/meeting this month.   She reports she is exercising by walking as much as she can, but is not able to go to pulmonary rehab right now. She is hoping she will be able to get back in.  She wants to talk to somebody about metformin because she thinks it is making her gain weight.   She feels she is gaining weight and that it more than what she would be gaining for what she is eating. (Eating salads, soups, but maybe more salt and sweets than she should. She lists the sodium content of the foods she eats over the phone and she is reporting low sodium foods.) She was breathless on the phone but says the reason was that she had just gotten off the bus and walked to her mailbox.   She is unsure if she should contact her heart doctor or make an appointment here to discuss. Please advise.

## 2015-09-15 ENCOUNTER — Encounter (HOSPITAL_COMMUNITY): Payer: Self-pay

## 2015-09-17 ENCOUNTER — Encounter (HOSPITAL_COMMUNITY): Payer: Self-pay

## 2015-09-19 ENCOUNTER — Encounter (HOSPITAL_COMMUNITY): Payer: Self-pay

## 2015-09-22 ENCOUNTER — Encounter (HOSPITAL_COMMUNITY): Payer: Self-pay

## 2015-09-24 ENCOUNTER — Encounter (HOSPITAL_COMMUNITY): Payer: Self-pay

## 2015-09-25 ENCOUNTER — Other Ambulatory Visit: Payer: Self-pay | Admitting: *Deleted

## 2015-09-25 ENCOUNTER — Ambulatory Visit: Payer: Medicaid Other | Admitting: Dietician

## 2015-09-25 ENCOUNTER — Encounter: Payer: Self-pay | Admitting: Dietician

## 2015-09-25 DIAGNOSIS — E785 Hyperlipidemia, unspecified: Secondary | ICD-10-CM

## 2015-09-25 MED ORDER — METFORMIN HCL 1000 MG PO TABS: ORAL_TABLET | ORAL | 2 refills | Status: DC

## 2015-09-25 MED ORDER — RIVAROXABAN 20 MG PO TABS: ORAL_TABLET | ORAL | 2 refills | Status: DC

## 2015-09-25 MED ORDER — PRAVASTATIN SODIUM 20 MG PO TABS: 20.0000 mg | ORAL_TABLET | Freq: Every day | ORAL | 2 refills | Status: DC

## 2015-09-25 MED ORDER — SPIRONOLACTONE 25 MG PO TABS: 12.5000 mg | ORAL_TABLET | Freq: Every day | ORAL | 1 refills | Status: DC

## 2015-09-25 MED ORDER — PANTOPRAZOLE SODIUM 20 MG PO TBEC: 20.0000 mg | DELAYED_RELEASE_TABLET | Freq: Two times a day (BID) | ORAL | 1 refills | Status: DC

## 2015-09-25 NOTE — Progress Notes (Signed)
Patient attended group diabetes meeting today. She participated in a cooking demonstration. We discussed how to choose healthy fats, increasing fiber. [name] was encouraged to limit high calorie food portions in addition to limiting salt.

## 2015-09-25 NOTE — Patient Instructions (Addendum)
Have fun making your healthy homemade french fries

## 2015-09-26 ENCOUNTER — Encounter (HOSPITAL_COMMUNITY): Payer: Self-pay

## 2015-09-29 ENCOUNTER — Encounter (HOSPITAL_COMMUNITY): Payer: Self-pay

## 2015-10-01 ENCOUNTER — Encounter (HOSPITAL_COMMUNITY): Payer: Self-pay

## 2015-10-03 ENCOUNTER — Encounter (HOSPITAL_COMMUNITY): Payer: Self-pay

## 2015-10-06 ENCOUNTER — Encounter (HOSPITAL_COMMUNITY): Payer: Self-pay

## 2015-10-08 ENCOUNTER — Encounter (HOSPITAL_COMMUNITY): Payer: Self-pay

## 2015-10-10 ENCOUNTER — Encounter (HOSPITAL_COMMUNITY): Payer: Self-pay

## 2015-10-13 ENCOUNTER — Encounter (HOSPITAL_COMMUNITY): Payer: Self-pay

## 2015-10-15 ENCOUNTER — Encounter (HOSPITAL_COMMUNITY): Payer: Self-pay

## 2015-10-17 ENCOUNTER — Encounter (HOSPITAL_COMMUNITY): Payer: Self-pay

## 2015-10-20 ENCOUNTER — Encounter (HOSPITAL_COMMUNITY): Payer: Self-pay

## 2015-10-20 ENCOUNTER — Other Ambulatory Visit (HOSPITAL_COMMUNITY): Payer: Self-pay | Admitting: *Deleted

## 2015-10-20 DIAGNOSIS — I270 Primary pulmonary hypertension: Secondary | ICD-10-CM

## 2015-10-20 MED ORDER — SILDENAFIL CITRATE 20 MG PO TABS: 20.0000 mg | ORAL_TABLET | Freq: Three times a day (TID) | ORAL | 3 refills | Status: DC

## 2015-10-22 ENCOUNTER — Encounter (HOSPITAL_COMMUNITY): Payer: Self-pay

## 2015-10-23 ENCOUNTER — Ambulatory Visit: Payer: Self-pay | Admitting: Dietician

## 2015-10-24 ENCOUNTER — Encounter (HOSPITAL_COMMUNITY): Payer: Self-pay

## 2015-10-27 ENCOUNTER — Encounter (HOSPITAL_COMMUNITY): Payer: Self-pay

## 2015-10-29 ENCOUNTER — Encounter (HOSPITAL_COMMUNITY): Payer: Self-pay

## 2015-10-31 ENCOUNTER — Encounter (HOSPITAL_COMMUNITY): Payer: Self-pay

## 2015-11-03 ENCOUNTER — Encounter (HOSPITAL_COMMUNITY): Payer: Self-pay

## 2015-11-19 ENCOUNTER — Telehealth: Payer: Self-pay | Admitting: Dietician

## 2015-11-19 NOTE — Telephone Encounter (Signed)
Called and reminded patient of appointment tomorrow- she plans to attend

## 2015-11-20 ENCOUNTER — Ambulatory Visit: Payer: Medicaid Other | Admitting: Dietician

## 2015-11-20 DIAGNOSIS — E1142 Type 2 diabetes mellitus with diabetic polyneuropathy: Secondary | ICD-10-CM

## 2015-11-20 NOTE — Progress Notes (Addendum)
Group Medical Nutrition Therapy Start time:1015 AM    end time: 11:15 AM  [name] attended a group diabetes meeting today. She verbalized that her plan for incorporating more activity into her life did not work and she is investigating back up plans such as the [employer] center. She is working on controlling her portions and snacking as well as intake of salt and convenience foods. We cooked and tasted a healthy pumpkin custard recipe today, shared successes and opportunities and ways to stay the course of choosing healthier options  during the holidays.  P: Gave her information about [facility],  request appointment with her PCP who she has not met and scheduled appointment for next group meet 12/25/15.  [name], [name], [name] 11/20/2015  1536 .

## 2015-11-20 NOTE — Patient Instructions (Signed)
Good luck at [facility]!  Good seeing you today!  Remember to take care of you first! You can can clean up after you take a five minute break or walk or massage. The dishes will still be there.  Ask for help!   See you Thursday, December 21

## 2015-12-09 ENCOUNTER — Other Ambulatory Visit: Payer: Self-pay | Admitting: Internal Medicine

## 2015-12-25 ENCOUNTER — Encounter (INDEPENDENT_AMBULATORY_CARE_PROVIDER_SITE_OTHER): Payer: Self-pay

## 2015-12-25 ENCOUNTER — Ambulatory Visit: Payer: Medicaid Other | Admitting: Dietician

## 2015-12-25 DIAGNOSIS — E1142 Type 2 diabetes mellitus with diabetic polyneuropathy: Secondary | ICD-10-CM

## 2015-12-25 NOTE — Progress Notes (Addendum)
Group Medical Nutrition Therapy Start time:1015 AM    end time: 11:15 AM  Sabrinna attended a group diabetes meeting today. She verbalized that her plan for incorporating more activity into her life did not work and she is investigating back up plans such as the [employer] center. She is working on controlling her portions and snacking as well as intake of sweets. She participated in a cooking demonstration of easy guacamole and she shared successes and opportunities and ways to stay the course of choosing healthier options  during the holidays. Her weight is increased by 4# in past month.  P: She is planning her transportation to [facility] for exercise and how she can decrease her sweet consumption (Fiver one bars and cookies instead of little Debbie's) Follow up 01/22/15.  Rosalene Wardrop, [name], [place] 12/25/2015  1536 .

## 2015-12-26 ENCOUNTER — Encounter: Payer: Self-pay | Admitting: Licensed Clinical Social Worker

## 2015-12-26 NOTE — Progress Notes (Signed)
Pt's completed Part A and Part B SCAT application faxed to Eligibility office.  Original placed to be scanned in chart.  Pt notified.

## 2015-12-26 NOTE — Patient Instructions (Addendum)
Jalin,   Your weight has been about the same for the past 6 months. You and I both know you can make it change by changing what you do! It is hard work.  You have done it before! I like your plan to try to loose weight to try eating the Fiber One bars instead of sweets like you did the l;ast time you lost weight.  I wonder what you think of calling me every Thursday to give me an update to try to support your efforts? Support on a weekly basis works for many- maybe it will help you!     If I do not answer please leave a message on my answering machine.  Call Thursday December 28,  January 4 and January 11th?   See you January 18th!  [name] [phone]

## 2015-12-29 ENCOUNTER — Emergency Department (HOSPITAL_COMMUNITY): Payer: Medicaid Other

## 2015-12-29 ENCOUNTER — Encounter (HOSPITAL_COMMUNITY): Payer: Self-pay

## 2015-12-29 ENCOUNTER — Emergency Department (HOSPITAL_COMMUNITY)
Admission: EM | Admit: 2015-12-29 | Discharge: 2015-12-29 | Disposition: A | Discharge status: Home / Self Care | Payer: Medicaid Other | Attending: Emergency Medicine | Admitting: Emergency Medicine

## 2015-12-29 DIAGNOSIS — Y939 Activity, unspecified: Secondary | ICD-10-CM | POA: Insufficient documentation

## 2015-12-29 DIAGNOSIS — R52 Pain, unspecified: Secondary | ICD-10-CM

## 2015-12-29 DIAGNOSIS — Y999 Unspecified external cause status: Secondary | ICD-10-CM | POA: Insufficient documentation

## 2015-12-29 DIAGNOSIS — W010XXA Fall on same level from slipping, tripping and stumbling without subsequent striking against object, initial encounter: Secondary | ICD-10-CM | POA: Diagnosis not present

## 2015-12-29 DIAGNOSIS — Z7901 Long term (current) use of anticoagulants: Secondary | ICD-10-CM | POA: Insufficient documentation

## 2015-12-29 DIAGNOSIS — I110 Hypertensive heart disease with heart failure: Secondary | ICD-10-CM | POA: Insufficient documentation

## 2015-12-29 DIAGNOSIS — W19XXXA Unspecified fall, initial encounter: Secondary | ICD-10-CM

## 2015-12-29 DIAGNOSIS — E1140 Type 2 diabetes mellitus with diabetic neuropathy, unspecified: Secondary | ICD-10-CM | POA: Insufficient documentation

## 2015-12-29 DIAGNOSIS — Z79899 Other long term (current) drug therapy: Secondary | ICD-10-CM | POA: Diagnosis not present

## 2015-12-29 DIAGNOSIS — Z7984 Long term (current) use of oral hypoglycemic drugs: Secondary | ICD-10-CM | POA: Diagnosis not present

## 2015-12-29 DIAGNOSIS — Y92009 Unspecified place in unspecified non-institutional (private) residence as the place of occurrence of the external cause: Secondary | ICD-10-CM | POA: Insufficient documentation

## 2015-12-29 DIAGNOSIS — J449 Chronic obstructive pulmonary disease, unspecified: Secondary | ICD-10-CM | POA: Diagnosis not present

## 2015-12-29 DIAGNOSIS — M546 Pain in thoracic spine: Secondary | ICD-10-CM | POA: Insufficient documentation

## 2015-12-29 DIAGNOSIS — M79672 Pain in left foot: Secondary | ICD-10-CM | POA: Insufficient documentation

## 2015-12-29 DIAGNOSIS — I5032 Chronic diastolic (congestive) heart failure: Secondary | ICD-10-CM | POA: Insufficient documentation

## 2015-12-29 MED ORDER — ACETAMINOPHEN 325 MG PO TABS
650.0000 mg | ORAL_TABLET | Freq: Once | ORAL | Status: AC
  Administered 2015-12-29: 650 mg via ORAL
  Filled 2015-12-29: qty 2

## 2015-12-29 MED ORDER — NITROGLYCERIN 2 % TD OINT: 1.0000 [in_us] | TOPICAL_OINTMENT | Freq: Once | TRANSDERMAL | Status: DC

## 2015-12-29 NOTE — ED Triage Notes (Addendum)
Pt. Was at home an slipped on water and fell onto her back.  She denies hitting her head.  She is having lt. Leg /foot pain. No deformity noted, no swelling noted.  She denies any neck pain  Pt. Is alert and oriented X4. Pt. Is on continuous BP medication for Pulmomary HTN  , Remodulin 5 mg

## 2015-12-29 NOTE — ED Provider Notes (Signed)
[facility] DEPT Provider Note   CSN: [id number] Arrival date & time: 12/29/15  1251     History   Chief Complaint Chief Complaint  Patient presents with  . Fall  . Leg Pain    HPI [name] is a 55 y.o. female.  HPI Patient reports she slipped on wet floor while washing dishes 12 noon today since the fall she complains of upper and lower back pain and left foot pain. Pain is worse with movement improved with remaining still. Pain is mild to moderate at present. She normally walks with a walker and was feeling well prior to the fall today. EMS treated patient with hard cervical collar. He denies neck pain denies abdominal pain denies headache. Denies syncope. No lightheadedness. Past Medical History:  Diagnosis Date  . Anemia, iron deficiency    secondary to menhorrhagia, on oral iron, also b12 def, getting monthly b12 shots  . CHF (congestive heart failure) ([place])   . Chronic cough    secondary to alleriges and post nasal drip  . Cognitive impairment   . COPD (chronic obstructive pulmonary disease) ([place])   . Cor pulmonale (HCC)    PA Peak pressure 76mHg  . Depression   . Diabetes mellitus    well controlled on metformin  . Diastolic heart failure ([place]   . GERD (gastroesophageal reflux disease)   . H/O mental retardation   . Herpes   . Hyperlipidemia   . Hypertension   . OSA (obstructive sleep apnea)    CPAP  . Pulmonary hypertension   . Renal disorder   . Restrictive lung disease    PFTs 06/2012 (FVC 54% predicted and FEV1 68% predicted w minimal bronchodilator response).  . Shortness of breath   . Venous stasis ulcer ([place]    chornic, ?followed up at wound care center, multiple courses of antibiotics in past for cellulitis, on lasix    Patient Active Problem List   Diagnosis Date Noted  . Abdominal wall pain in left lower quadrant 08/21/2015  . Skin lesion of scalp 08/08/2015  . Postmenopausal vaginal bleeding 03/21/2015  . Intertrigo 02/18/2015    . Vaginal candidiasis 02/18/2015  . Rash 10/31/2014  . Peripheral neuropathy ([place] 09/27/2014  . Healthcare maintenance 09/27/2014  . Bilateral leg paresthesia 09/24/2014  . Left knee pain 07/23/2014  . Microcytic anemia 07/22/2014  . Abnormal stress test 02/21/2014  . Chronic diastolic CHF (congestive heart failure) ([place] 06/25/2013  . Pulmonary HTN 09/02/2012  . Hypokalemia 09/02/2012  . Atrial flutter ([place] 08/17/2012  . Hepatitis B core antibody positive 06/29/2012  . Restrictive lung disease 06/28/2012  . Right heart failure, NYHA class 3 03/15/2012  . Allergic rhinitis 11/16/2011  . Diabetes ([place] 10/15/2010  . Hyperlipidemia 10/15/2010  . Obesity 10/14/2010  . SLEEP APNEA, OBSTRUCTIVE 02/21/2009  . GERD 11/29/2005  . ANEMIA, B12 DEFICIENCY 10/19/2005  . MENTAL RETARDATION 10/19/2005    Past Surgical History:  Procedure Laterality Date  . CHOLECYSTECTOMY    . LEFT AND RIGHT HEART CATHETERIZATION WITH CORONARY ANGIOGRAM N/A 02/28/2014   Procedure: LEFT AND RIGHT HEART CATHETERIZATION WITH CORONARY ANGIOGRAM;  Surgeon: DJolaine Artist MD;  [facility];  Service: Cardiovascular;  Laterality: N/A;    OB History    Gravida Para Term Preterm AB Living   _0 SAB TAB Ectopic Multiple Live Births  Home Medications    Prior to Admission medications   Medication Sig Start Date End Date Taking? Authorizing Provider  acetaminophen (TYLENOL) 500 MG tablet Take 1 tablet (500 mg total) by mouth every 6 (six) hours as needed for mild pain, moderate pain, fever or headache. 08/16/13   Antonietta Breach, PA-C  acetaminophen (TYLENOL) 500 MG tablet Take 1 tablet (500 mg total) by mouth every 6 (six) hours as needed. 08/21/15   [name], MD  cephALEXin (KEFLEX) 250 MG capsule Take 1 capsule (250 mg total) by mouth 4 (four) times daily. 07/24/15   Konrad Felix, PA  cetirizine (ZYRTEC) 10 MG tablet TAKE ONE TABLET BY MOUTH ONCE DAILY 07/04/14    Corky Sox, MD  cyanocobalamin 500 MCG tablet Take 500 mcg by mouth daily.    Historical Provider, MD  cyclobenzaprine (FEXMID) 7.5 MG tablet Take 1 tablet (7.5 mg total) by mouth 3 (three) times daily as needed for muscle spasms. 08/21/15   [name], MD  diclofenac sodium (VOLTAREN) 1 % GEL Apply 4 g topically 4 (four) times daily. 08/21/15   [name], MD  diphenhydrAMINE (BENADRYL) 25 mg capsule Take 25 mg by mouth every 6 (six) hours as needed for itching.    Historical Provider, MD  docusate sodium (COLACE) 100 MG capsule Take 1 capsule (100 mg total) by mouth 2 (two) times daily as needed for mild constipation. 07/23/14   Corky Sox, MD  ferrous sulfate 325 (65 FE) MG tablet Take 1 tablet (325 mg total) by mouth daily with breakfast. 07/23/14   Corky Sox, MD  gabapentin (NEURONTIN) 300 MG capsule TAKE TWO CAPSULES BY MOUTH THREE TIMES DAILY 12/11/15   Minus Liberty, MD  glucose blood (ACCU-CHEK AVIVA PLUS) test strip Use  To check blood sugar up to 1 time a day 07/25/15   [name], MD  loperamide (IMODIUM) 2 MG capsule Take 2 mg by mouth daily as needed for diarrhea or loose stools.    Historical Provider, MD  metFORMIN (GLUCOPHAGE) 1000 MG tablet Please take 1/2 tablet daily with breakfast 09/25/15   Minus Liberty, MD  nystatin (MYCOSTATIN/NYSTOP) 100000 UNIT/GM POWD Apply to skin folds affected 2 times daily until redness and irritation resolves. 02/18/15   Corky Sox, MD  pantoprazole (PROTONIX) 20 MG tablet Take 1 tablet (20 mg total) by mouth 2 (two) times daily. 09/25/15   Minus Liberty, MD  potassium chloride SA (KLOR-CON M20) 20 MEQ tablet TAKE TWO TABLETS BY MOUTH THREE TIMES DAILY, TAKE EXTRA TABLET WHEN TAKING AN EXTRA TORESMIDE 05/13/15   Corky Sox, MD  pravastatin (PRAVACHOL) 20 MG tablet Take 1 tablet (20 mg total) by mouth daily. 09/25/15   Minus Liberty, MD  rivaroxaban (XARELTO) 20 MG TABS tablet TAKE ONE TABLET BY MOUTH ONCE DAILY WITH   SUPPER 09/25/15   Minus Liberty, MD  sildenafil (REVATIO) 20 MG tablet Take 1 tablet (20 mg total) by mouth 3 (three) times daily. 10/20/15   Jolaine Artist, MD  spironolactone (ALDACTONE) 25 MG tablet Take 0.5 tablets (12.5 mg total) by mouth daily. 09/25/15   Minus Liberty, MD  torsemide (DEMADEX) 20 MG tablet TAKE THREE TABLETS BY MOUTH ONCE DAILY 08/04/15   Sid Falcon, MD  Vaginal Lubricant (REPLENS) GEL Apply three times weekly to vaginal mucosa 05/13/15   Corky Sox, MD   Oxygen 3 L nasal cannula Family History Family History  Problem Relation Age of Onset  . Mental illness Sister   .  Mental retardation Brother     Social History Social History  Substance Use Topics  . Smoking status: Never Smoker  . Smokeless tobacco: Never Used  . Alcohol use No     Allergies   Aspirin; Codeine; Lisinopril; and Sulfonamide derivatives   Review of Systems Review of Systems  Musculoskeletal: Positive for arthralgias, back pain and gait problem.       Left foot pain. Walks with walker  All other systems reviewed and are negative.    Physical Exam Updated Vital Signs BP 97/58 (BP Location: Right Arm)   Pulse 85   Temp 98.5 F (36.9 C) (Oral)   Resp 18   Ht _0  (1.626 m)   Wt 270 lb (122.5 kg)   LMP 02/19/2011   SpO2 95%   BMI 46.35 kg/m   Physical Exam  Constitutional: She appears well-developed and well-nourished.  HENT:  Head: Normocephalic and atraumatic.  Eyes: Conjunctivae are normal. Pupils are equal, round, and reactive to light.  Neck: Neck supple. No tracheal deviation present. No thyromegaly present.  Cardiovascular: Normal rate and regular rhythm.   No murmur heard. Pulmonary/Chest: Effort normal and breath sounds normal.  Abdominal: Soft. Bowel sounds are normal. She exhibits no distension. There is no tenderness.  Morbidly obese  Musculoskeletal: Normal range of motion. She exhibits no edema or tenderness.  Cervical spine nontender. She is  tender over mid thoracic spine. Lumbar spine is nontender. Pelvis stable nontender. Left lower extremity skin intact. No soft tissue swelling. No ecchymosis She has mild diffuse tenderness over dorsum of foot. DP pulse 2+. Good capillary refill. All other extremity is a contusion abrasion or tenderness neurovascularly intact  Neurological: She is alert. Coordination normal.  Skin: Skin is warm and dry. No rash noted.  Psychiatric: She has a normal mood and affect.  Nursing note and vitals reviewed.    ED Treatments / Results  Labs (all labs ordered are listed, but only abnormal results are displayed) Labs Reviewed - No data to display  EKG  EKG Interpretation None       Radiology No results found.  Procedures Procedures (including critical care time)  Medications Ordered in ED Medications  acetaminophen (TYLENOL) tablet 650 mg (650 mg Oral Given 12/29/15 1340)    Results for orders placed or performed in visit on 07/10/15  [facility]  Result Value Ref Range   FSH 63.6 mIU/mL   Dg Thoracic Spine 2 View  Result Date: 12/29/2015 CLINICAL DATA:  Fall EXAM: THORACIC SPINE 2 VIEWS COMPARISON:  06/25/2013 FINDINGS: Anatomic alignment. No vertebral compression deformity. Anterior bridging osteophytes are seen throughout the thoracic spine. No definite acute fracture. IMPRESSION: No acute bony pathology. Electronically Signed   By: Marybelle Killings M.D.   On: 12/29/2015 14:21   Dg Lumbar Spine Complete  Result Date: 12/29/2015 CLINICAL DATA:  Low back pain since a slip and fall today. Initial encounter. EXAM: LUMBAR SPINE - COMPLETE 4+ VIEW COMPARISON:  Plain films lumbar spine 09/24/2014. FINDINGS: No fracture or malalignment is identified. Anterior endplate spurring lower thoracic and upper lumbar spine is noted. Facet degenerative disease L5-S1 is seen. IMPRESSION: No acute abnormality.  Stable compared to prior exam. Electronically Signed   By: Inge Rise M.D.   On: 12/29/2015 14:23    Dg Foot Complete Left  Result Date: 12/29/2015 CLINICAL DATA:  Left foot injury due to a slip and fall today. Initial encounter. EXAM: LEFT FOOT - COMPLETE 3+ VIEW COMPARISON:  None. FINDINGS: No acute bony  or joint abnormality is seen. Bones are somewhat osteopenic. Calcaneal spurring is noted. Soft tissues unremarkable. IMPRESSION: No acute abnormality. Electronically Signed   By: Inge Rise M.D.   On: 12/29/2015 14:22   Initial Impression / Assessment and Plan / ED Course  I have reviewed the triage vital signs and the nursing notes.  Pertinent labs & imaging results that were available during my care of the patient were reviewed by me and considered in my medical decision making (see chart for details).  Clinical Course   X-rays viewed by me. 4:20 PM patient is alert ambulates with her walker without further assistance. She feels ready for discharge after treatment with Tylenol. Plan keep scheduled plan with Dr.Bensihmon in 2 days Diagnosis #1 fall #2 contusions multiple sites    Final Clinical Impressions(s) / ED Diagnoses   Final diagnoses:  Pain    New Prescriptions New Prescriptions   No medications on file     Orlie Dakin, MD 12/29/15 1630

## 2015-12-29 NOTE — ED Notes (Signed)
Placed the Pt on 3L O2 [place] and ambulated the pt to the doorway and had her stand there for a couple minutes. Pt SPO2 was at 100% and stayed there.  Pt was able to hold her self up with help of a walker. While ambulating the pt she denied lightheadedness or dizziness.

## 2015-12-29 NOTE — ED Notes (Signed)
[facility] contacted to return patient home

## 2015-12-29 NOTE — ED Notes (Signed)
Pt assisted to [facility], able to stand on own with 1 stand by assist. Transfers without difficulty.

## 2015-12-29 NOTE — ED Notes (Signed)
Pt's SPO2 was in the low 90's - high 80's. Pt states she is on 3L on home w/ hx of COPD.  Placed Pt on 1L of O2 and will ambulate to see how her sats maintain.

## 2015-12-29 NOTE — Discharge Instructions (Signed)
Take Tylenol as directed for pain. Keep your scheduled appointment with Dr.Bensimhon in 2 days. Return if your condition worsens for any reason.

## 2015-12-29 NOTE — ED Notes (Signed)
Pt verbalized understanding discharge instructions and denies any further needs or questions at this time. VS stable, ambulatory and steady gait.   

## 2015-12-31 ENCOUNTER — Ambulatory Visit (HOSPITAL_COMMUNITY)
Admission: RE | Admit: 2015-12-31 | Discharge: 2015-12-31 | Disposition: A | Discharge status: Home / Self Care | Payer: Medicaid Other | Source: Ambulatory Visit | Attending: Internal Medicine | Admitting: Internal Medicine

## 2015-12-31 ENCOUNTER — Encounter (HOSPITAL_COMMUNITY): Payer: Self-pay | Admitting: Cardiology

## 2015-12-31 ENCOUNTER — Other Ambulatory Visit: Payer: Self-pay | Admitting: Internal Medicine

## 2015-12-31 ENCOUNTER — Encounter (HOSPITAL_COMMUNITY): Payer: Self-pay | Admitting: Internal Medicine

## 2015-12-31 VITALS — BP 118/66 | HR 84 | Wt 272.5 lb

## 2015-12-31 DIAGNOSIS — I4892 Unspecified atrial flutter: Secondary | ICD-10-CM | POA: Diagnosis not present

## 2015-12-31 DIAGNOSIS — Z7901 Long term (current) use of anticoagulants: Secondary | ICD-10-CM | POA: Insufficient documentation

## 2015-12-31 DIAGNOSIS — I5032 Chronic diastolic (congestive) heart failure: Secondary | ICD-10-CM | POA: Diagnosis not present

## 2015-12-31 DIAGNOSIS — Z79899 Other long term (current) drug therapy: Secondary | ICD-10-CM | POA: Diagnosis not present

## 2015-12-31 DIAGNOSIS — I110 Hypertensive heart disease with heart failure: Secondary | ICD-10-CM | POA: Insufficient documentation

## 2015-12-31 DIAGNOSIS — I272 Pulmonary hypertension, unspecified: Secondary | ICD-10-CM | POA: Diagnosis not present

## 2015-12-31 DIAGNOSIS — I2721 Secondary pulmonary arterial hypertension: Secondary | ICD-10-CM | POA: Insufficient documentation

## 2015-12-31 DIAGNOSIS — E119 Type 2 diabetes mellitus without complications: Secondary | ICD-10-CM | POA: Diagnosis not present

## 2015-12-31 DIAGNOSIS — G4733 Obstructive sleep apnea (adult) (pediatric): Secondary | ICD-10-CM | POA: Diagnosis not present

## 2015-12-31 DIAGNOSIS — Z7984 Long term (current) use of oral hypoglycemic drugs: Secondary | ICD-10-CM | POA: Insufficient documentation

## 2015-12-31 NOTE — Progress Notes (Signed)
[name], female   DOB: 08/13/1960, 55 y.o.   MRN: [id number]    [name], female   DOB: 05/16/1960, 55 y.o.    [facility] Cardiologist: Dr Fortin [facility]    HPI: [name] is a 55 y.o. 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   She was admitted to [facility] in July 2014 with respiratory failure and RHC showed severe PAH with pulmonary pressures in the 90s. She was transferred to [facility] where she was started on IV Remodulin with excellent results.  She was admitted again 08/16/12 for CP and tachycardia, was found to be in atypical flutter vs atrial tach. Amiodarone was considered but she converted spontaneously. She was started on Xarelto. Her discharge weight was 215 lbs.    Had CP in 11/15 and Myoview with EF 78% with reversible defect in distal anterior wall and apex. (ischemia vs breast attenuation) EF 78%  She returns today for follow up.  Saw Dr. Fortin at [facility] in September 2017. Has gained 60 pounds in 2 years.  Encouraged weight loss and to keep a food log.  Currently on Revatio 20 tid and Remodulin 45. Overall feeling ok. Slipped and fell on Christmas and seen in ER. Weight at home 264-270 pounds which is stable. Uses walker to get around. Uses cane in the house. Continue 3-4 liters oxygen. Taking all medications.  [place] CPAP every night. Breathing ok except when allergies flare. Some swelling recently.   Studies:  Cath 2/16: Minimal CAD LAD 30% otherwise normal RHC on Remodulin RA = 8 RV = 45/5/8 PA = 52/24 (34) PCW = 11 Fick cardiac output/index = 7.6/3.6 PVR = 3.0 WU FA sat = 99% PA sat = 70%, 71%  ECHO 08/11/12 EF 60-65% RV moderately dilated Peak PA pressure 48 mmhg  ECHO 02/28/13 EF 55-60% Grade I DD  RV severely dilated and HK. Trivial TR. Septum flat Peak PA pressure 53 mmhg ECHO 11/15 EF 55-60% RV normal Trivial TR ECHO 1/16 at [facility] EF >55% RV mildly dilated (improved function) Trivial TR normal IVC ECHO 11/2014: EF 55-60%.  RV modrately dilated. Peak PA pressure 78 mm hg ECHO 03/2015: At [facility] RV severely dilated. EF >55% RVSP 60mmHG  6MW 3/17 at [facility] on 3 L. Room air sat was 96% sats fell briefly to 89 at the very end of the walk she went 341 m this is 82% predicted  6MWD 9/17 was 362 m  This is 88% predicted on 3 L oxygen saturations did not fall below 93%.   Labs        09/29/12: K+ 3.6, Creatinine 0.87       02/12/13 K 3.4 Creatinine 0.67 ---> given extra potassium and started on 12.5 mg spironolactone.         05/17/2013  K 4.3 Creatinine 0.68        11/15  K 3.4 Cr 0.67       2/16    K 4.1 Cr 0.78       9/16:   K 3.8 Cr 1.08         02/18/2015: K 4.3 Creatinine 0.64            ROS: All systems negative except as listed in HPI, PMH and Problem List.    Past Medical History:  Diagnosis Date  . Anemia, iron deficiency    secondary to menhorrhagia, on oral iron, also b12 def, getting monthly b12 shots  . CHF (congestive heart failure) (HCC)   . Chronic cough    secondary to alleriges and post nasal drip  . Cognitive impairment   . COPD (chronic obstructive pulmonary disease) (HCC)   . Cor pulmonale (HCC)    PA Peak pressure 89mmHg  . Depression   . Diabetes mellitus    well controlled on metformin  . Diastolic heart failure (HCC)   . GERD (gastroesophageal reflux disease)   . H/O mental retardation   . Herpes   . Hyperlipidemia   . Hypertension   . OSA (obstructive sleep apnea)    CPAP  . Pulmonary hypertension   . Renal disorder   . Restrictive lung disease    PFTs 06/2012 (FVC 54% predicted and FEV1 68% predicted w minimal bronchodilator response).  . Shortness of breath   . Venous stasis ulcer (HCC)    chornic, ?followed up at wound care center, multiple courses  of antibiotics in past for cellulitis, on lasix    Current Outpatient Prescriptions  Medication Sig Dispense Refill  . acetaminophen (TYLENOL) 500 MG tablet Take 1 tablet (500 mg total) by mouth every 6 (six) hours as needed for mild pain, moderate pain, fever or headache. 30 tablet 0  . acetaminophen (TYLENOL) 500 MG tablet Take 1 tablet (500 mg total) by mouth every 6 (six) hours as needed. 30 tablet 0  . cephALEXin (KEFLEX) 250 MG capsule Take 1 capsule (250 mg total) by mouth 4 (four) times daily. 28 capsule 0  . cetirizine (ZYRTEC) 10 MG tablet TAKE ONE TABLET BY MOUTH ONCE DAILY 30 tablet 1  . cyanocobalamin 500 MCG tablet Take 500 mcg by mouth daily.    . cyclobenzaprine (FEXMID) 7.5 MG tablet Take 1 tablet (7.5 mg total) by mouth 3 (three) times daily as needed for muscle spasms. 15 tablet 0  . diclofenac sodium (VOLTAREN) 1 % GEL Apply 4 g topically 4 (four) times daily. 1 Tube 2  . diphenhydrAMINE (BENADRYL) 25 mg capsule Take 25 mg by mouth every 6 (six) hours as needed for itching.    . docusate sodium (COLACE) 100 MG capsule Take 1 capsule (100 mg total) by mouth 2 (two) times daily as needed for mild constipation. 60 capsule 3  . ferrous sulfate 325 (65 FE) MG tablet Take 1 tablet (325 mg total) by mouth daily with breakfast. 90 tablet 3  . gabapentin (NEURONTIN) 300 MG capsule TAKE TWO CAPSULES BY MOUTH THREE TIMES DAILY 180 capsule 5  . glucose blood (ACCU-CHEK AVIVA PLUS) test strip Use  To check blood sugar up to 1 time a day 50 each 5  . loperamide (IMODIUM) 2 MG capsule Take 2 mg by mouth daily as needed for diarrhea or loose stools.    . metFORMIN (GLUCOPHAGE) 1000 MG tablet Please take 1/2 tablet daily with breakfast 45 tablet 2  . nystatin (MYCOSTATIN/NYSTOP) 100000 UNIT/GM POWD Apply to skin folds affected 2 times daily until redness and irritation resolves. 30 g 0  . pantoprazole (PROTONIX) 20 MG tablet Take 1 tablet (20 mg total) by mouth 2 (two) times daily. 180 tablet  1  . potassium chloride SA (KLOR-CON M20) 20 MEQ tablet TAKE TWO TABLETS BY MOUTH THREE TIMES DAILY, TAKE EXTRA TABLET WHEN TAKING AN EXTRA TORESMIDE 180 tablet 5  . pravastatin (PRAVACHOL) 20 MG tablet Take 1   tablet (20 mg total) by mouth daily. 90 tablet 2  . rivaroxaban (XARELTO) 20 MG TABS tablet TAKE ONE TABLET BY MOUTH ONCE DAILY WITH  SUPPER 30 tablet 2  . sildenafil (REVATIO) 20 MG tablet Take 1 tablet (20 mg total) by mouth 3 (three) times daily. 270 tablet 3  . spironolactone (ALDACTONE) 25 MG tablet Take 0.5 tablets (12.5 mg total) by mouth daily. 45 tablet 1  . torsemide (DEMADEX) 20 MG tablet TAKE THREE TABLETS BY MOUTH ONCE DAILY 90 tablet 6  . Vaginal Lubricant (REPLENS) GEL Apply three times weekly to vaginal mucosa 35 g 1   No current facility-administered medications for this encounter.     Vitals:   12/31/15 1004  BP: 118/66  Pulse: 84  SpO2: 91%  Weight: 272 lb 8 oz (123.6 kg)   Filed Weights   12/31/15 1004  Weight: 272 lb 8 oz (123.6 kg)   Physical Exam:  General: Morbidly obese. On chronic 3 L O2 used rolling walker to ambulate into clinic.  HEENT: normal  Neck: supple. JVP hard to see doesn't look elevated  Carotids 2+ bilat; no bruits. No lymphadenopathy or thryomegaly appreciated.  Cor: PMI nonpalpable.Regular rate. I/VI TR, increased P2.  Hickman catheter site ok. .  Lungs: clear  Abdomen: obese soft, nontender, nondistended. No hepatosplenomegaly. No bruits or masses. Good bowel sounds.  Extremities: no cyanosis, clubbing, trace edema.  Neuro: alert & orientedx3, cranial nerves grossly intact. moves all 4 extremities w/o difficulty. Affect pleasant    ASSESSMENT & PLAN:  1) Chronic diastolic HF/cor pulmonale:  - Recent ECHO at [facility]. PAH stable on remodulin and sildenafil. If orthostasis continues may need to reduce remodulin dose or diuretics slightly.  - Continue torsemide to 60 mg daily. Takes extra as needed - Continue current meds.  2) PAH,  severe with cor pulmonale.  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 However PAH far out of proportion to L-sided pressures or restrictive lung disease. - Overall stable. 6MW improved. NYHA II.  Doing well with sildenafill and Remodulin  - Follows with [facility] q 6 months,   - Continue current regimen. Referred to pulmonary rehab but medicaid won't pay for it. . - It has been almost 2 years since her last RHC. Will repeat 3) Paroxysmal atrial flutter.  Regular pulse today. Continue Xarelto. 4) Obesity - Encouraged weight loss  Glori Bickers, MD 10:34 AM

## 2015-12-31 NOTE — Patient Instructions (Signed)
Your physician has requested that you have a cardiac catheterization. Cardiac catheterization is used to diagnose and/or treat various heart conditions. Doctors may recommend this procedure for a number of different reasons. The most common reason is to evaluate chest pain. Chest pain can be a symptom of coronary artery disease (CAD), and cardiac catheterization can show whether plaque is narrowing or blocking your heart's arteries. This procedure is also used to evaluate the valves, as well as measure the blood flow and oxygen levels in different parts of your heart. For further information please visit [email or web address]. Please follow instruction sheet, as given.   Your physician recommends that you schedule a follow-up appointment in: 6 months with Dr Haroldine Laws  Do the following things EVERYDAY: 1) Weigh yourself in the morning before breakfast. Write it down and keep it in a log. 2) Take your medicines as prescribed 3) Eat low salt foods-Limit salt (sodium) to 2000 mg per day.  4) Stay as active as you can everyday 5) Limit all fluids for the day to less than 2 liters

## 2015-12-31 NOTE — Addendum Note (Signed)
Encounter addended by: Kerry Dory, CMA on: 12/31/2015 10:50 AM<BR>    Actions taken: Sign clinical note

## 2016-01-07 ENCOUNTER — Other Ambulatory Visit: Payer: Self-pay

## 2016-01-07 MED ORDER — RIVAROXABAN 20 MG PO TABS: 20.0000 mg | ORAL_TABLET | Freq: Every day | ORAL | 3 refills | Status: DC

## 2016-01-07 NOTE — Telephone Encounter (Signed)
rivaroxaban (XARELTO) 20 MG TABS tablet, refill request @ [facility] on [address].

## 2016-01-09 ENCOUNTER — Other Ambulatory Visit (HOSPITAL_COMMUNITY): Payer: Self-pay | Admitting: *Deleted

## 2016-01-09 DIAGNOSIS — I272 Pulmonary hypertension, unspecified: Secondary | ICD-10-CM

## 2016-01-13 ENCOUNTER — Ambulatory Visit (HOSPITAL_COMMUNITY)
Admission: RE | Admit: 2016-01-13 | Discharge: 2016-01-13 | Disposition: A | Discharge status: Home / Self Care | Payer: Medicaid Other | Source: Ambulatory Visit | Attending: Internal Medicine | Admitting: Internal Medicine

## 2016-01-13 ENCOUNTER — Encounter (HOSPITAL_COMMUNITY)
Admission: RE | Disposition: A | Discharge status: Home / Self Care | Payer: Self-pay | Source: Ambulatory Visit | Attending: Internal Medicine

## 2016-01-13 DIAGNOSIS — I110 Hypertensive heart disease with heart failure: Secondary | ICD-10-CM | POA: Insufficient documentation

## 2016-01-13 DIAGNOSIS — Z6841 Body Mass Index (BMI) 40.0 and over, adult: Secondary | ICD-10-CM | POA: Insufficient documentation

## 2016-01-13 DIAGNOSIS — I4892 Unspecified atrial flutter: Secondary | ICD-10-CM | POA: Insufficient documentation

## 2016-01-13 DIAGNOSIS — Z7984 Long term (current) use of oral hypoglycemic drugs: Secondary | ICD-10-CM | POA: Diagnosis not present

## 2016-01-13 DIAGNOSIS — E785 Hyperlipidemia, unspecified: Secondary | ICD-10-CM | POA: Insufficient documentation

## 2016-01-13 DIAGNOSIS — K219 Gastro-esophageal reflux disease without esophagitis: Secondary | ICD-10-CM | POA: Diagnosis not present

## 2016-01-13 DIAGNOSIS — E119 Type 2 diabetes mellitus without complications: Secondary | ICD-10-CM | POA: Insufficient documentation

## 2016-01-13 DIAGNOSIS — F329 Major depressive disorder, single episode, unspecified: Secondary | ICD-10-CM | POA: Diagnosis not present

## 2016-01-13 DIAGNOSIS — Z7901 Long term (current) use of anticoagulants: Secondary | ICD-10-CM | POA: Insufficient documentation

## 2016-01-13 DIAGNOSIS — Z79899 Other long term (current) drug therapy: Secondary | ICD-10-CM | POA: Diagnosis not present

## 2016-01-13 DIAGNOSIS — I5032 Chronic diastolic (congestive) heart failure: Secondary | ICD-10-CM | POA: Insufficient documentation

## 2016-01-13 DIAGNOSIS — G4733 Obstructive sleep apnea (adult) (pediatric): Secondary | ICD-10-CM | POA: Insufficient documentation

## 2016-01-13 DIAGNOSIS — I2781 Cor pulmonale (chronic): Secondary | ICD-10-CM | POA: Diagnosis not present

## 2016-01-13 DIAGNOSIS — I2721 Secondary pulmonary arterial hypertension: Secondary | ICD-10-CM | POA: Insufficient documentation

## 2016-01-13 DIAGNOSIS — J449 Chronic obstructive pulmonary disease, unspecified: Secondary | ICD-10-CM | POA: Insufficient documentation

## 2016-01-13 DIAGNOSIS — I272 Pulmonary hypertension, unspecified: Secondary | ICD-10-CM

## 2016-01-13 HISTORY — PX: CARDIAC CATHETERIZATION: SHX172

## 2016-01-13 LAB — POCT I-STAT 3, VENOUS BLOOD GAS (G3P V)
Acid-Base Excess: 4 mmol/L — ABNORMAL HIGH (ref 0.0–2.0)
Acid-Base Excess: 4 mmol/L — ABNORMAL HIGH (ref 0.0–2.0)
Bicarbonate: 29.2 mmol/L — ABNORMAL HIGH (ref 20.0–28.0)
Bicarbonate: 29.2 mmol/L — ABNORMAL HIGH (ref 20.0–28.0)
O2 SAT: 69 %
O2 Saturation: 69 %
PCO2 VEN: 47 mmHg (ref 44.0–60.0)
PCO2 VEN: 47 mmHg (ref 44.0–60.0)
PO2 VEN: 37 mmHg (ref 32.0–45.0)
TCO2: 31 mmol/L (ref 0–100)
TCO2: 31 mmol/L (ref 0–100)
pH, Ven: 7.401 (ref 7.250–7.430)
pH, Ven: 7.402 (ref 7.250–7.430)
pO2, Ven: 36 mmHg (ref 32.0–45.0)

## 2016-01-13 LAB — BASIC METABOLIC PANEL
Anion gap: 10 (ref 5–15)
BUN: 8 mg/dL (ref 6–20)
CHLORIDE: 102 mmol/L (ref 101–111)
CO2: 29 mmol/L (ref 22–32)
CREATININE: 0.81 mg/dL (ref 0.44–1.00)
Calcium: 8.8 mg/dL — ABNORMAL LOW (ref 8.9–10.3)
GFR calc Af Amer: >60 mL/min (ref >60)
GFR calc non Af Amer: >60 mL/min (ref >60)
GLUCOSE: 101 mg/dL — AB (ref 65–99)
Potassium: 3.1 mmol/L — ABNORMAL LOW (ref 3.5–5.1)
Sodium: 141 mmol/L (ref 135–145)

## 2016-01-13 LAB — PROTIME-INR
INR: 0.98
Prothrombin Time: 13 seconds (ref 11.4–15.2)

## 2016-01-13 LAB — CBC
HCT: 42.9 % (ref 36.0–46.0)
Hemoglobin: 14 g/dL (ref 12.0–15.0)
MCH: 27.7 pg (ref 26.0–34.0)
MCHC: 32.6 g/dL (ref 30.0–36.0)
MCV: 84.8 fL (ref 78.0–100.0)
PLATELETS: 460 10*3/uL — AB (ref 150–400)
RBC: 5.06 MIL/uL (ref 3.87–5.11)
RDW: 16.8 % — ABNORMAL HIGH (ref 11.5–15.5)
WBC: 11.2 10*3/uL — ABNORMAL HIGH (ref 4.0–10.5)

## 2016-01-13 LAB — GLUCOSE, CAPILLARY
Glucose-Capillary: 99 mg/dL (ref 65–99)
Glucose-Capillary: 92 mg/dL (ref 65–99)

## 2016-01-13 SURGERY — RIGHT HEART CATH
Anesthesia: LOCAL

## 2016-01-13 MED ORDER — SODIUM CHLORIDE 0.9% FLUSH: 3.0000 mL | Freq: Two times a day (BID) | INTRAVENOUS | Status: DC

## 2016-01-13 MED ORDER — SODIUM CHLORIDE 0.9% FLUSH: 3.0000 mL | INTRAVENOUS | Status: DC | PRN

## 2016-01-13 MED ORDER — FENTANYL CITRATE (PF) 100 MCG/2ML IJ SOLN
INTRAMUSCULAR | Status: AC
  Filled 2016-01-13: qty 2

## 2016-01-13 MED ORDER — LIDOCAINE HCL (PF) 1 % IJ SOLN
INTRAMUSCULAR | Status: DC | PRN
  Administered 2016-01-13: 2 mL
  Administered 2016-01-13: 20 mL
  Administered 2016-01-13: 31 mL

## 2016-01-13 MED ORDER — SODIUM CHLORIDE 0.9 % IV SOLN: 250.0000 mL | INTRAVENOUS | Status: DC | PRN

## 2016-01-13 MED ORDER — HEPARIN (PORCINE) IN NACL 2-0.9 UNIT/ML-% IJ SOLN
INTRAMUSCULAR | Status: DC | PRN
  Administered 2016-01-13: 500 mL

## 2016-01-13 MED ORDER — FENTANYL CITRATE (PF) 100 MCG/2ML IJ SOLN
INTRAMUSCULAR | Status: DC | PRN
  Administered 2016-01-13 (×2): 25 ug via INTRAVENOUS

## 2016-01-13 MED ORDER — SODIUM CHLORIDE 0.9 % IV SOLN
INTRAVENOUS | Status: DC
  Administered 2016-01-13: 08:00:00 via INTRAVENOUS

## 2016-01-13 MED ORDER — DIPHENHYDRAMINE HCL 50 MG/ML IJ SOLN
INTRAMUSCULAR | Status: AC
Start: 2016-01-13 — End: 2016-01-13
  Administered 2016-01-13: 25 mg via INTRAVENOUS
  Filled 2016-01-13: qty 1

## 2016-01-13 MED ORDER — OXYCODONE-ACETAMINOPHEN 5-325 MG PO TABS: 1.0000 | ORAL_TABLET | ORAL | Status: DC | PRN

## 2016-01-13 MED ORDER — ACETAMINOPHEN 325 MG PO TABS: 650.0000 mg | ORAL_TABLET | ORAL | Status: DC | PRN

## 2016-01-13 MED ORDER — LIDOCAINE HCL (PF) 1 % IJ SOLN
INTRAMUSCULAR | Status: AC
  Filled 2016-01-13: qty 30

## 2016-01-13 MED ORDER — POTASSIUM CHLORIDE CRYS ER 20 MEQ PO TBCR
EXTENDED_RELEASE_TABLET | ORAL | Status: AC
  Filled 2016-01-13: qty 2

## 2016-01-13 MED ORDER — ONDANSETRON HCL 4 MG/2ML IJ SOLN: 4.0000 mg | Freq: Four times a day (QID) | INTRAMUSCULAR | Status: DC | PRN

## 2016-01-13 MED ORDER — ASPIRIN 81 MG PO CHEW: 81.0000 mg | CHEWABLE_TABLET | ORAL | Status: DC

## 2016-01-13 MED ORDER — POTASSIUM CHLORIDE CRYS ER 20 MEQ PO TBCR
40.0000 meq | EXTENDED_RELEASE_TABLET | Freq: Once | ORAL | Status: AC
  Administered 2016-01-13: 40 meq via ORAL

## 2016-01-13 MED ORDER — HEPARIN (PORCINE) IN NACL 2-0.9 UNIT/ML-% IJ SOLN
INTRAMUSCULAR | Status: AC
  Filled 2016-01-13: qty 500

## 2016-01-13 MED ORDER — DIPHENHYDRAMINE HCL 50 MG/ML IJ SOLN
25.0000 mg | Freq: Once | INTRAMUSCULAR | Status: AC
  Administered 2016-01-13: 25 mg via INTRAVENOUS

## 2016-01-13 SURGICAL SUPPLY — 9 items
CATH BALLN WEDGE 5F 110CM (CATHETERS) ×1 IMPLANT
CATH SWAN GANZ 7F STRAIGHT (CATHETERS) ×1 IMPLANT
COVER PRB 48X5XTLSCP FOLD TPE (BAG) IMPLANT
COVER PROBE 5X48 (BAG) ×2
GUIDEWIRE .025 260CM (WIRE) ×1 IMPLANT
PACK CARDIAC CATHETERIZATION (CUSTOM PROCEDURE TRAY) ×2 IMPLANT
SHEATH FAST CATH BRACH 5F 5CM (SHEATH) ×1 IMPLANT
SHEATH PINNACLE 7F 10CM (SHEATH) ×1 IMPLANT
TRANSDUCER W/STOPCOCK (MISCELLANEOUS) ×3 IMPLANT

## 2016-01-13 NOTE — Progress Notes (Signed)
K+ 3.1, wbc 11.2  Dr Jeffie Pollock was called and updated, orders followed.

## 2016-01-13 NOTE — Discharge Instructions (Signed)
Femoral Site Care Introduction Refer to this sheet in the next few weeks. These instructions provide you with information about caring for yourself after your procedure. Your health care provider may also give you more specific instructions. Your treatment has been planned according to current medical practices, but problems sometimes occur. Call your health care provider if you have any problems or questions after your procedure. What can I expect after the procedure? After your procedure, it is typical to have the following:  Bruising at the site that usually fades within 1-2 weeks.  Blood collecting in the tissue (hematoma) that may be painful to the touch. It should usually decrease in size and tenderness within 1-2 weeks. Follow these instructions at home:  Take medicines only as directed by your health care provider.  You may shower 24-48 hours after the procedure or as directed by your health care provider. Remove the bandage (dressing) and gently wash the site with plain soap and water. Pat the area dry with a clean towel. Do not rub the site, because this may cause bleeding.  Do not take baths, swim, or use a hot tub until your health care provider approves.  Check your insertion site every day for redness, swelling, or drainage.  Do not apply powder or lotion to the site.  Limit use of stairs to twice a day for the first 2-3 days or as directed by your health care provider.  Do not squat for the first 2-3 days or as directed by your health care provider.  Do not lift over 10 lb (4.5 kg) for 5 days after your procedure or as directed by your health care provider.  Ask your health care provider when it is okay to:  Return to work or school.  Resume usual physical activities or sports.  Resume sexual activity.  Do not drive home if you are discharged the same day as the procedure. Have someone else drive you.  You may drive 24 hours after the procedure unless otherwise  instructed by your health care provider.  Do not operate machinery or power tools for 24 hours after the procedure or as directed by your health care provider.  If your procedure was done as an outpatient procedure, which means that you went home the same day as your procedure, a responsible adult should be with you for the first 24 hours after you arrive home.  Keep all follow-up visits as directed by your health care provider. This is important. Contact a health care provider if:  You have a fever.  You have chills.  You have increased bleeding from the site. Hold pressure on the site. Get help right away if:  You have unusual pain at the site.  You have redness, warmth, or swelling at the site.  You have drainage (other than a small amount of blood on the dressing) from the site.  The site is bleeding, and the bleeding does not stop after 30 minutes of holding steady pressure on the site.  Your leg or foot becomes pale, cool, tingly, or numb. This information is not intended to replace advice given to you by your health care provider. Make sure you discuss any questions you have with your health care provider. Document Released: 08/24/2013 Document Revised: 05/29/2015 Document Reviewed: 07/10/2013  2017 Elsevier

## 2016-01-13 NOTE — Interval H&P Note (Signed)
History and Physical Interval Note:  01/13/2016 8:49 AM  [name]  has presented today for surgery, with the diagnosis of PAH  The various methods of treatment have been discussed with the patient and family. After consideration of risks, benefits and other options for treatment, the patient has consented to  Procedure(s): Right Heart Cath (N/A) as a surgical intervention .  The patient's history has been reviewed, patient examined, no change in status, stable for surgery.  I have reviewed the patient's chart and labs.  Questions were answered to the patient's satisfaction.     Bensimhon, Quillian Quince

## 2016-01-13 NOTE — H&P (View-Only) (Signed)
[name], female   DOB: 02/21/1960, 56 y.o.   MRN: [id number]    [name], female   DOB: 23-Feb-1960, 56 y.o.    [facility] Cardiologist: Dr Kelby Aline    HPI: [name] is a 56 y.o. 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   She was admitted to [facility] in July 2014 with respiratory failure and RHC showed severe PAH with pulmonary pressures in the 90s. She was transferred to [facility] where she was started on IV Remodulin with excellent results.  She was admitted again 08/16/12 for CP and tachycardia, was found to be in atypical flutter vs atrial tach. Amiodarone was considered but she converted spontaneously. She was started on Xarelto. Her discharge weight was 215 lbs.    Had CP in 11/15 and Myoview with EF 78% with reversible defect in distal anterior wall and apex. (ischemia vs breast attenuation) EF 78%  She returns today for follow up.  Saw Dr. Gilles Chiquito at [facility] in September 2017. Has gained 60 pounds in 2 years.  Encouraged weight loss and to keep a food log.  Currently on Revatio 20 tid and Remodulin 45. Overall feeling ok. Slipped and fell on Christmas and seen in ER. Weight at home 264-270 pounds which is stable. Uses walker to get around. Uses cane in the house. Continue 3-4 liters oxygen. Taking all medications.  Waering CPAP every night. Breathing ok except when allergies flare. Some swelling recently.   Studies:  Cath 2/16: Minimal CAD LAD 30% otherwise normal RHC on Remodulin RA = 8 RV = 45/5/8 PA = 52/24 (34) PCW = 11 Fick cardiac output/index = 7.6/3.6 PVR = 3.0 WU FA sat = 99% PA sat = 70%, 71%  ECHO 08/11/12 EF 60-65% RV moderately dilated Peak PA pressure 48 mmhg  ECHO 02/28/13 EF 55-60% Grade I DD  RV severely dilated and HK. Trivial TR. Septum flat Peak PA pressure 53 mmhg ECHO 11/15 EF 55-60% RV normal Trivial TR ECHO 1/16 at [facility] EF >55% RV mildly dilated (improved function) Trivial TR normal IVC ECHO 11/2014: EF 55-60%.  RV modrately dilated. Peak PA pressure 78 mm hg ECHO 03/2015: At [facility] RV severely dilated. EF >55% RVSP 35mHG  [facility] air sat was 96% sats fell briefly to 89 at the very end of the walk she went 341 m this is 82% predicted  6MWD 9/17 was 362 m  This is 88% predicted on 3 L oxygen saturations did not fall below 93%.   Labs        09/29/12: K+ 3.6, Creatinine 0.87       02/12/13 K 3.4 Creatinine 0.67 ---> given extra potassium and started on 12.5 mg spironolactone.         05/17/2013  K 4.3 Creatinine 0.68        11/15  K 3.4 Cr 0.67       2/16    K 4.1 Cr 0.78       9/16:   K 3.8 Cr 1.08         02/18/2015: K 4.3 Creatinine 0.64            ROS: All systems negative except as listed in HPI, PMH and Problem List.  Past Medical History:  Diagnosis Date  . Anemia, iron deficiency    secondary to menhorrhagia, on oral iron, also b12 def, getting monthly b12 shots  . CHF (congestive heart failure) ([place])   . Chronic cough    secondary to alleriges and post nasal drip  . Cognitive impairment   . COPD (chronic obstructive pulmonary disease) ([place])   . Cor pulmonale (HCC)    PA Peak pressure 4mHg  . Depression   . Diabetes mellitus    well controlled on metformin  . Diastolic heart failure ([place]   . GERD (gastroesophageal reflux disease)   . H/O mental retardation   . Herpes   . Hyperlipidemia   . Hypertension   . OSA (obstructive sleep apnea)    CPAP  . Pulmonary hypertension   . Renal disorder   . Restrictive lung disease    PFTs 06/2012 (FVC 54% predicted and FEV1 68% predicted w minimal bronchodilator response).  . Shortness of breath   . Venous stasis ulcer ([place]    chornic, ?followed up at wound care center, multiple courses  of antibiotics in past for cellulitis, on lasix    Current Outpatient Prescriptions  Medication Sig Dispense Refill  . acetaminophen (TYLENOL) 500 MG tablet Take 1 tablet (500 mg total) by mouth every 6 (six) hours as needed for mild pain, moderate pain, fever or headache. 30 tablet 0  . acetaminophen (TYLENOL) 500 MG tablet Take 1 tablet (500 mg total) by mouth every 6 (six) hours as needed. 30 tablet 0  . cephALEXin (KEFLEX) 250 MG capsule Take 1 capsule (250 mg total) by mouth 4 (four) times daily. 28 capsule 0  . cetirizine (ZYRTEC) 10 MG tablet TAKE ONE TABLET BY MOUTH ONCE DAILY 30 tablet 1  . cyanocobalamin 500 MCG tablet Take 500 mcg by mouth daily.    . cyclobenzaprine (FEXMID) 7.5 MG tablet Take 1 tablet (7.5 mg total) by mouth 3 (three) times daily as needed for muscle spasms. 15 tablet 0  . diclofenac sodium (VOLTAREN) 1 % GEL Apply 4 g topically 4 (four) times daily. 1 Tube 2  . diphenhydrAMINE (BENADRYL) 25 mg capsule Take 25 mg by mouth every 6 (six) hours as needed for itching.    . docusate sodium (COLACE) 100 MG capsule Take 1 capsule (100 mg total) by mouth 2 (two) times daily as needed for mild constipation. 60 capsule 3  . ferrous sulfate 325 (65 FE) MG tablet Take 1 tablet (325 mg total) by mouth daily with breakfast. 90 tablet 3  . gabapentin (NEURONTIN) 300 MG capsule TAKE TWO CAPSULES BY MOUTH THREE TIMES DAILY 180 capsule 5  . glucose blood (ACCU-CHEK AVIVA PLUS) test strip Use  To check blood sugar up to 1 time a day 50 each 5  . loperamide (IMODIUM) 2 MG capsule Take 2 mg by mouth daily as needed for diarrhea or loose stools.    . metFORMIN (GLUCOPHAGE) 1000 MG tablet Please take 1/2 tablet daily with breakfast 45 tablet 2  . nystatin (MYCOSTATIN/NYSTOP) 100000 UNIT/GM POWD Apply to skin folds affected 2 times daily until redness and irritation resolves. 30 g 0  . pantoprazole (PROTONIX) 20 MG tablet Take 1 tablet (20 mg total) by mouth 2 (two) times daily. 180 tablet  1  . potassium chloride SA (KLOR-CON M20) 20 MEQ tablet TAKE TWO TABLETS BY MOUTH THREE TIMES DAILY, TAKE EXTRA TABLET WHEN TAKING AN EXTRA TORESMIDE 180 tablet 5  . pravastatin (PRAVACHOL) 20 MG tablet Take 1  tablet (20 mg total) by mouth daily. 90 tablet 2  . rivaroxaban (XARELTO) 20 MG TABS tablet TAKE ONE TABLET BY MOUTH ONCE DAILY WITH  SUPPER 30 tablet 2  . sildenafil (REVATIO) 20 MG tablet Take 1 tablet (20 mg total) by mouth 3 (three) times daily. 270 tablet 3  . spironolactone (ALDACTONE) 25 MG tablet Take 0.5 tablets (12.5 mg total) by mouth daily. 45 tablet 1  . torsemide (DEMADEX) 20 MG tablet TAKE THREE TABLETS BY MOUTH ONCE DAILY 90 tablet 6  . Vaginal Lubricant (REPLENS) GEL Apply three times weekly to vaginal mucosa 35 g 1   No current facility-administered medications for this encounter.     Vitals:   12/31/15 1004  BP: 118/66  Pulse: 84  SpO2: 91%  Weight: 272 lb 8 oz (123.6 kg)   Filed Weights   12/31/15 1004  Weight: 272 lb 8 oz (123.6 kg)   Physical Exam:  General: Morbidly obese. On chronic 3 L O2 used rolling walker to ambulate into clinic.  HEENT: normal  Neck: supple. JVP hard to see doesn't look elevated  Carotids 2+ bilat; no bruits. No lymphadenopathy or thryomegaly appreciated.  Cor: PMI nonpalpable.Regular rate. I/VI TR, increased P2.  Hickman catheter site ok. .  Lungs: clear  Abdomen: obese soft, nontender, nondistended. No hepatosplenomegaly. No bruits or masses. Good bowel sounds.  Extremities: no cyanosis, clubbing, trace edema.  Neuro: alert & orientedx3, cranial nerves grossly intact. moves all 4 extremities w/o difficulty. Affect pleasant    ASSESSMENT & PLAN:  1) Chronic diastolic HF/cor pulmonale:  - Recent ECHO at [facility]. PAH stable on remodulin and sildenafil. If orthostasis continues may need to reduce remodulin dose or diuretics slightly.  - Continue torsemide to 60 mg daily. Takes extra as needed - Continue current meds.  2) PAH,  severe with cor pulmonale.  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 However PAH far out of proportion to L-sided pressures or restrictive lung disease. - Overall stable. 6MW improved. NYHA II.  Doing well with sildenafill and Remodulin  - Follows with [facility] q 6 months,   - Continue current regimen. Referred to pulmonary rehab but medicaid won't pay for it. . - It has been almost 2 years since her last RHC. Will repeat 3) Paroxysmal atrial flutter.  Regular pulse today. Continue Xarelto. 4) Obesity - Encouraged weight loss  Glori Bickers, [name] 10:34 AM

## 2016-01-14 ENCOUNTER — Encounter (HOSPITAL_COMMUNITY): Payer: Self-pay | Admitting: Internal Medicine

## 2016-01-16 ENCOUNTER — Telehealth: Payer: Self-pay

## 2016-01-16 NOTE — Telephone Encounter (Signed)
Rasdheeda from the pharmacy needs to speak with a nurse regarding med. i

## 2016-01-20 NOTE — Telephone Encounter (Signed)
Spoke w/ 2 reps at this company, they state no one from their pharmacy has called [facility], had the last make a note that [facility] did return the call

## 2016-01-22 ENCOUNTER — Ambulatory Visit: Payer: Self-pay | Admitting: Dietician

## 2016-01-27 ENCOUNTER — Telehealth: Payer: Self-pay | Admitting: Dietician

## 2016-01-28 NOTE — Telephone Encounter (Signed)
Spoke with patient yesterday to reschedule group Medical Nutrition therapy meeting in February, She is still wokring on transportation to the [place] [facility] to get more activity in. She is also looking into less expesive options that [employer] bars to eat instead of  cookies and candy due to a decrease in food stamps and limited budget. We discussed trying fruit if able to get for free from [employer]. Two flyers with days and times of [employer] mailed to patient.

## 2016-01-29 ENCOUNTER — Telehealth: Payer: Self-pay | Admitting: Internal Medicine

## 2016-01-29 NOTE — Telephone Encounter (Signed)
Calling for diagnosis codes so they can send the patiens meds out

## 2016-01-29 NOTE — Telephone Encounter (Signed)
Called pharmacy gave diag code for atrial flutter r/t xarelto I48.92

## 2016-02-01 NOTE — Progress Notes (Signed)
   CC: forgetfulness  HPI:  [name] is a 56 y.o. woman with history of DM, PAH, and CHF who presents for follow-up of her diabetes.  Please see A&P for status of the patient's chronic medical conditions.  For the past year she thinks her memory getting worse.  She has missed her stop on the bus while preoccupied with daylight savings time, left butter out for a few hours, walked into a room and forget what she was going to do.  Has not gotten lost or done anything dangerous.  She does not think it is affecting her life very much.  Past Medical History:  Diagnosis Date  . Anemia, iron deficiency    secondary to menhorrhagia, on oral iron, also b12 def, getting monthly b12 shots  . CHF (congestive heart failure) ([place])   . Chronic cough    secondary to alleriges and post nasal drip  . Cognitive impairment   . COPD (chronic obstructive pulmonary disease) ([place])   . Cor pulmonale (HCC)    PA Peak pressure 88mHg  . Depression   . Diabetes mellitus    well controlled on metformin  . Diastolic heart failure ([place]   . GERD (gastroesophageal reflux disease)   . H/O mental retardation   . Herpes   . Hyperlipidemia   . Hypertension   . OSA (obstructive sleep apnea)    CPAP  . Pulmonary hypertension   . Renal disorder   . Restrictive lung disease    PFTs 06/2012 (FVC 54% predicted and FEV1 68% predicted w minimal bronchodilator response).  . Shortness of breath   . Ven[place] stasis ulcer ([place]    chornic, ?followed up at wound care center, multiple courses of antibiotics in past for cellulitis, on lasix    Review of Systems: Review of Systems  Constitutional: Negative for chills and fever.  Respiratory: Positive for cough and shortness of breath. Negative for sputum production.   Cardiovascular: Negative for chest pain and palpitations.  Gastrointestinal: Negative for blood in stool and melena.  Musculoskeletal: Positive for back pain and joint pain.     Physical  Exam:  Vitals:   02/02/16 1337  BP: 110/61  Pulse: 94  Temp: 98.2 F (36.8 C)  TempSrc: Oral  SpO2: 90%  Weight: 266 lb (120.7 kg)   86% on RA with ambulation  Wt Readings from Last 3 Encounters:  02/02/16 266 lb (120.7 kg)  01/13/16 269 lb (122 kg)  12/31/15 272 lb 8 oz (123.6 kg)    Physical Exam  Constitutional: She is oriented to person, place, and time.  Obese woman, in no distress, wearing nasal canula  Cardiovascular: Normal rate and regular rhythm.   Pulmonary/Chest: Effort normal and breath sounds normal.  Musculoskeletal: She exhibits no edema or tenderness.  Neurological: She is alert and oriented to person, place, and time.  Psychiatric: She has a normal mood and affect. Her behavior is normal.    Assessment & Plan:   See Encounters Tab for problem based charting.  Patient discussed with Dr. BLynnae January

## 2016-02-01 NOTE — Assessment & Plan Note (Addendum)
Lab Results  Component Value Date   HGBA1C 6.0 02/02/2016   HGBA1C 6.2 05/13/2015   HGBA1C 5.6 11/12/2014    Recent Labs  02/02/16 1347  GLUCAP 107*    Current medications: metformin 500 mg daily Current insulin: none  Assessment HgbA1c goal: <7.0 Glycemic control: well controlled Complications: peripheral neuropathy  Plan Medications: continue current meds Insulin: none Other:  -A1c today -appointment with ophthalmologist Herbert Deaner on 3/20

## 2016-02-01 NOTE — Assessment & Plan Note (Signed)
Lipid Panel     Component Value Date/Time   CHOL 159 07/22/2014 1517   TRIG 119 07/22/2014 1517   HDL 45 (L) 07/22/2014 1517   CHOLHDL 3.5 07/22/2014 1517   VLDL 24 07/22/2014 1517   LDLCALC 90 07/22/2014 1517   ASCVD 2.8%. -continue pravastatin 20 mg daily for low intensity statin therapy

## 2016-02-01 NOTE — Assessment & Plan Note (Addendum)
Has not noticed melena, hematochezia, or other bleeding.  BMs are mostly green.  Regular rate and rhythm today.  Current medications: rivaroxaban  A/P History of Aflutter, in NSR today, tolerating DOAC without bleeding. -Continue rivaroxaban

## 2016-02-01 NOTE — Assessment & Plan Note (Addendum)
R heart failure secondary to [facility].  Follows with Dr Jeffie Pollock and [facility].  R Heart Cath 01/13/2016 showed moderate PAH, improved since starting treprostinil.  Breathing and LE edema stable, euvolemic on exam today.  Current medications: torsemide 60 mg daily, spironolactone 12.54m daily, treprostinil  A/P Euvolemic, stable on current regimen. -Continue current meds -Follow-up w/ HF and [facility] as scheduled

## 2016-02-02 ENCOUNTER — Ambulatory Visit (INDEPENDENT_AMBULATORY_CARE_PROVIDER_SITE_OTHER): Payer: Medicaid Other | Admitting: Internal Medicine

## 2016-02-02 VITALS — BP 110/61 | HR 94 | Temp 98.2°F | Wt 266.0 lb

## 2016-02-02 DIAGNOSIS — Z23 Encounter for immunization: Secondary | ICD-10-CM

## 2016-02-02 DIAGNOSIS — Z9981 Dependence on supplemental oxygen: Secondary | ICD-10-CM | POA: Diagnosis not present

## 2016-02-02 DIAGNOSIS — G4733 Obstructive sleep apnea (adult) (pediatric): Secondary | ICD-10-CM

## 2016-02-02 DIAGNOSIS — I270 Primary pulmonary hypertension: Secondary | ICD-10-CM | POA: Diagnosis not present

## 2016-02-02 DIAGNOSIS — Z79899 Other long term (current) drug therapy: Secondary | ICD-10-CM

## 2016-02-02 DIAGNOSIS — Z0000 Encounter for general adult medical examination without abnormal findings: Secondary | ICD-10-CM

## 2016-02-02 DIAGNOSIS — E785 Hyperlipidemia, unspecified: Secondary | ICD-10-CM | POA: Diagnosis not present

## 2016-02-02 DIAGNOSIS — Z7984 Long term (current) use of oral hypoglycemic drugs: Secondary | ICD-10-CM | POA: Diagnosis not present

## 2016-02-02 DIAGNOSIS — I5032 Chronic diastolic (congestive) heart failure: Secondary | ICD-10-CM

## 2016-02-02 DIAGNOSIS — I4892 Unspecified atrial flutter: Secondary | ICD-10-CM

## 2016-02-02 DIAGNOSIS — Z7901 Long term (current) use of anticoagulants: Secondary | ICD-10-CM

## 2016-02-02 DIAGNOSIS — Z9989 Dependence on other enabling machines and devices: Secondary | ICD-10-CM

## 2016-02-02 DIAGNOSIS — E876 Hypokalemia: Secondary | ICD-10-CM

## 2016-02-02 DIAGNOSIS — I272 Pulmonary hypertension, unspecified: Secondary | ICD-10-CM

## 2016-02-02 DIAGNOSIS — E1142 Type 2 diabetes mellitus with diabetic polyneuropathy: Secondary | ICD-10-CM

## 2016-02-02 LAB — POCT GLYCOSYLATED HEMOGLOBIN (HGB A1C): Hemoglobin A1C: 6

## 2016-02-02 LAB — GLUCOSE, CAPILLARY: Glucose-Capillary: 107 mg/dL — ABNORMAL HIGH (ref 65–99)

## 2016-02-02 NOTE — Assessment & Plan Note (Addendum)
BMP Latest Ref Rng & Units 01/13/2016 02/18/2015 07/01/2014  Glucose 65 - 99 mg/dL 101(H) 69 141(H)  BUN 6 - 20 mg/dL _0 Creatinine 0.44 - 1.00 mg/dL 0.81 0.64 1.08(H)  BUN/Creat Ratio 9 - 23 - 20 -  Sodium 135 - 145 mmol/L 141 140 137  Potassium 3.5 - 5.1 mmol/L 3.1(L) 4.3 3.8  Chloride 101 - 111 mmol/L 102 101 100(L)  CO2 22 - 32 mmol/L _1 Calcium 8.9 - 10.3 mg/dL 8.8(L) 8.9 9.1    K 3.1 earlier in the month.  On torsemide 60 mg daily and KCl 40 mEq TID.  -BMP today -continue KCl 40 mEq TID

## 2016-02-02 NOTE — Assessment & Plan Note (Signed)
Follows with Dr Jeffie Pollock and Dr Gilles Chiquito at [facility] Pulmonary Hypertension clinic.  Stable dyspnea and exertional capacity.  Uses O2 during the day only intermittently, because sometimes she feels like she doesn't need to.  Her portable tank often runs out after 1-2 hours, so she will often not have oxygen when she goes out.  Desat to 88% with ambulation.  A/P Stable PAH, desaturations to high 80s with ambulation, recommend continuous oxygen. -HH RN to evaluate oxygen usage and needs and provide education -3L O2 at all times -CPAP with 3L O2 at night -Continue sildenaful, treprostinil continous infusion -F/u with [facility]

## 2016-02-02 NOTE — Patient Instructions (Addendum)
Your diabetes is doing great - congratulations!  Make sure you get in to see your eye doctor in the next couple of months.  Please wear your oxygen at all times, even when you don't feel short of breath.  This is important to keep your lungs and heart from getting worse.  Also, keep wearing your CPAP with oxygen all night every night.

## 2016-02-02 NOTE — Assessment & Plan Note (Signed)
Has CPAP, wears all night every night with 3L O2.  -Continue CPAP

## 2016-02-03 LAB — BMP8+ANION GAP
ANION GAP: 19 mmol/L — AB (ref 10.0–18.0)
BUN/Creatinine Ratio: 19 (ref 9–23)
BUN: 15 mg/dL (ref 6–24)
CO2: 24 mmol/L (ref 18–29)
CREATININE: 0.79 mg/dL (ref 0.57–1.00)
Calcium: 9.1 mg/dL (ref 8.7–10.2)
Chloride: 99 mmol/L (ref 96–106)
GFR calc Af Amer: 98 mL/min/{1.73_m2} (ref >59)
GFR calc non Af Amer: 85 mL/min/{1.73_m2} (ref >59)
Glucose: 94 mg/dL (ref 65–99)
POTASSIUM: 3.9 mmol/L (ref 3.5–5.2)
SODIUM: 142 mmol/L (ref 134–144)

## 2016-02-03 NOTE — Progress Notes (Signed)
Internal Medicine Clinic Attending  Case discussed with Dr. Inda Castle at the time of the visit.  We reviewed the resident's history and exam and pertinent patient test results.  I agree with the assessment, diagnosis, and plan of care documented in the resident's note.

## 2016-02-07 DIAGNOSIS — J440 Chronic obstructive pulmonary disease with acute lower respiratory infection: Secondary | ICD-10-CM | POA: Diagnosis not present

## 2016-02-09 ENCOUNTER — Telehealth: Payer: Self-pay

## 2016-02-09 ENCOUNTER — Inpatient Hospital Stay (HOSPITAL_COMMUNITY)
Admission: EM | Admit: 2016-02-09 | Discharge: 2016-02-12 | DRG: 194 | Disposition: A | Discharge status: Home Health | Payer: Medicaid Other | Attending: Internal Medicine | Admitting: Internal Medicine

## 2016-02-09 ENCOUNTER — Encounter (HOSPITAL_COMMUNITY): Payer: Self-pay

## 2016-02-09 ENCOUNTER — Emergency Department (HOSPITAL_COMMUNITY): Payer: Medicaid Other

## 2016-02-09 ENCOUNTER — Encounter: Payer: Self-pay | Admitting: Internal Medicine

## 2016-02-09 DIAGNOSIS — E785 Hyperlipidemia, unspecified: Secondary | ICD-10-CM | POA: Diagnosis present

## 2016-02-09 DIAGNOSIS — J189 Pneumonia, unspecified organism: Secondary | ICD-10-CM

## 2016-02-09 DIAGNOSIS — J441 Chronic obstructive pulmonary disease with (acute) exacerbation: Secondary | ICD-10-CM | POA: Diagnosis present

## 2016-02-09 DIAGNOSIS — E119 Type 2 diabetes mellitus without complications: Secondary | ICD-10-CM

## 2016-02-09 DIAGNOSIS — E669 Obesity, unspecified: Secondary | ICD-10-CM | POA: Diagnosis present

## 2016-02-09 DIAGNOSIS — J1000 Influenza due to other identified influenza virus with unspecified type of pneumonia: Principal | ICD-10-CM | POA: Diagnosis present

## 2016-02-09 DIAGNOSIS — E1140 Type 2 diabetes mellitus with diabetic neuropathy, unspecified: Secondary | ICD-10-CM | POA: Diagnosis present

## 2016-02-09 DIAGNOSIS — G629 Polyneuropathy, unspecified: Secondary | ICD-10-CM

## 2016-02-09 DIAGNOSIS — I5032 Chronic diastolic (congestive) heart failure: Secondary | ICD-10-CM | POA: Diagnosis present

## 2016-02-09 DIAGNOSIS — J101 Influenza due to other identified influenza virus with other respiratory manifestations: Secondary | ICD-10-CM

## 2016-02-09 DIAGNOSIS — F329 Major depressive disorder, single episode, unspecified: Secondary | ICD-10-CM | POA: Diagnosis present

## 2016-02-09 DIAGNOSIS — Z886 Allergy status to analgesic agent status: Secondary | ICD-10-CM

## 2016-02-09 DIAGNOSIS — I110 Hypertensive heart disease with heart failure: Secondary | ICD-10-CM | POA: Diagnosis present

## 2016-02-09 DIAGNOSIS — Z7984 Long term (current) use of oral hypoglycemic drugs: Secondary | ICD-10-CM

## 2016-02-09 DIAGNOSIS — G4733 Obstructive sleep apnea (adult) (pediatric): Secondary | ICD-10-CM | POA: Diagnosis present

## 2016-02-09 DIAGNOSIS — Z79899 Other long term (current) drug therapy: Secondary | ICD-10-CM

## 2016-02-09 DIAGNOSIS — Z7901 Long term (current) use of anticoagulants: Secondary | ICD-10-CM

## 2016-02-09 DIAGNOSIS — I5082 Biventricular heart failure: Secondary | ICD-10-CM | POA: Diagnosis present

## 2016-02-09 DIAGNOSIS — I272 Pulmonary hypertension, unspecified: Secondary | ICD-10-CM

## 2016-02-09 DIAGNOSIS — K219 Gastro-esophageal reflux disease without esophagitis: Secondary | ICD-10-CM | POA: Diagnosis present

## 2016-02-09 DIAGNOSIS — I2721 Secondary pulmonary arterial hypertension: Secondary | ICD-10-CM | POA: Diagnosis present

## 2016-02-09 DIAGNOSIS — Z6841 Body Mass Index (BMI) 40.0 and over, adult: Secondary | ICD-10-CM

## 2016-02-09 DIAGNOSIS — Z9981 Dependence on supplemental oxygen: Secondary | ICD-10-CM

## 2016-02-09 DIAGNOSIS — J440 Chronic obstructive pulmonary disease with acute lower respiratory infection: Secondary | ICD-10-CM | POA: Diagnosis present

## 2016-02-09 DIAGNOSIS — J181 Lobar pneumonia, unspecified organism: Secondary | ICD-10-CM

## 2016-02-09 DIAGNOSIS — Z9049 Acquired absence of other specified parts of digestive tract: Secondary | ICD-10-CM

## 2016-02-09 DIAGNOSIS — I4892 Unspecified atrial flutter: Secondary | ICD-10-CM | POA: Diagnosis present

## 2016-02-09 DIAGNOSIS — E876 Hypokalemia: Secondary | ICD-10-CM | POA: Diagnosis present

## 2016-02-09 LAB — CBC WITH DIFFERENTIAL/PLATELET
BASOS ABS: 0 10*3/uL (ref 0.0–0.1)
BASOS PCT: 0 %
EOS ABS: 0 10*3/uL (ref 0.0–0.7)
Eosinophils Relative: 0 %
HEMATOCRIT: 40.8 % (ref 36.0–46.0)
Hemoglobin: 13 g/dL (ref 12.0–15.0)
Lymphocytes Relative: 15 %
Lymphs Abs: 1.4 10*3/uL (ref 0.7–4.0)
MCH: 27.1 pg (ref 26.0–34.0)
MCHC: 31.9 g/dL (ref 30.0–36.0)
MCV: 85 fL (ref 78.0–100.0)
Monocytes Absolute: 1 10*3/uL (ref 0.1–1.0)
Monocytes Relative: 11 %
NEUTROS ABS: 6.9 10*3/uL (ref 1.7–7.7)
NEUTROS PCT: 74 %
Platelets: 414 10*3/uL — ABNORMAL HIGH (ref 150–400)
RBC: 4.8 MIL/uL (ref 3.87–5.11)
RDW: 16.6 % — ABNORMAL HIGH (ref 11.5–15.5)
WBC: 9.2 10*3/uL (ref 4.0–10.5)

## 2016-02-09 LAB — URINALYSIS, ROUTINE W REFLEX MICROSCOPIC
BILIRUBIN URINE: NEGATIVE
Glucose, UA: NEGATIVE mg/dL
KETONES UR: NEGATIVE mg/dL
LEUKOCYTES UA: NEGATIVE
NITRITE: NEGATIVE
Protein, ur: NEGATIVE mg/dL
Specific Gravity, Urine: 1.008 (ref 1.005–1.030)
pH: 5 (ref 5.0–8.0)

## 2016-02-09 LAB — COMPREHENSIVE METABOLIC PANEL
ALK PHOS: 75 U/L (ref 38–126)
ALT: 18 U/L (ref 14–54)
AST: 22 U/L (ref 15–41)
Albumin: 3.7 g/dL (ref 3.5–5.0)
Anion gap: 12 (ref 5–15)
BUN: 13 mg/dL (ref 6–20)
CALCIUM: 8.3 mg/dL — AB (ref 8.9–10.3)
CO2: 24 mmol/L (ref 22–32)
CREATININE: 1 mg/dL (ref 0.44–1.00)
Chloride: 99 mmol/L — ABNORMAL LOW (ref 101–111)
Glucose, Bld: 104 mg/dL — ABNORMAL HIGH (ref 65–99)
Potassium: 3.5 mmol/L (ref 3.5–5.1)
SODIUM: 135 mmol/L (ref 135–145)
Total Bilirubin: 0.5 mg/dL (ref 0.3–1.2)
Total Protein: 6.9 g/dL (ref 6.5–8.1)

## 2016-02-09 LAB — I-STAT CG4 LACTIC ACID, ED: Lactic Acid, Venous: 1.89 mmol/L (ref 0.5–1.9)

## 2016-02-09 NOTE — ED Triage Notes (Signed)
Pt is on Cardiac med  Melnarone pump

## 2016-02-09 NOTE — ED Provider Notes (Signed)
[facility] DEPT Provider Note   CSN: [id number] Arrival date & time: 02/09/16  2251  By signing my name below, I, Gwenlyn Fudge, attest that this documentation has been prepared under the direction and in the presence of Rolland Porter, MD. Electronically Signed: Gwenlyn Fudge, ED Scribe. 02/10/16. 1:04 AM.  Time seen 12:09 AM   History   Chief Complaint Chief Complaint  Patient presents with  . Cough  . Influenza  . Chills   The history is provided by the patient. No language interpreter was used.   HPI Comments: [name] is a 56 y.o. female with PMHx of CHF, COPD, DM, Pulmonary HTN, and HLD who presents to the Emergency Department complaining of gradual onset and worsening, persistent, productive cough since 02/04/2016. The mucus she produces is yellow-black, white, clear and occasionally has drops of blood. Pt has chronic cough at baseline due to CHF, but she states this cough is worse and "deeper" than normal. She reports associated generalized abdominal pain when she coughs, chills, nausea, vomiting once today, 1 episode of  "lumpy" stool today, dry mouth, and clear/bloody rhinorrhea. Abdominal pain is brought on with cough. Pt had 1 episode of vomiting that began today. She has not checked her temperature at home. Pt denies known sick contact. She wears 4 L O2/min at home and also uses a C-PAP at night. She last saw her PCP on 1/29 at which time she received a flu vaccine. She does not smoke or drink alcohol. She lives at home with her son and a roommate. She denies diarrhea, sore throat, acute bilateral leg swelling. Pt is on Melnarone pump for her pulmonary HTN.   PCP Minus Liberty, MD  [facility] Cardiology Dr Sung Amabile  Past Medical History:  Diagnosis Date  . Anemia, iron deficiency    secondary to menhorrhagia, on oral iron, also b12 def, getting monthly b12 shots  . CHF (congestive heart failure) ([place])   . Chronic cough    secondary to alleriges and post nasal drip  .  Cognitive impairment   . COPD (chronic obstructive pulmonary disease) ([place])   . Cor pulmonale (HCC)    PA Peak pressure 15mHg  . Depression   . Diabetes mellitus    well controlled on metformin  . Diastolic heart failure ([place]   . GERD (gastroesophageal reflux disease)   . H/O mental retardation   . Herpes   . Hyperlipidemia   . Hypertension   . OSA (obstructive sleep apnea)    CPAP  . Pulmonary hypertension   . Renal disorder   . Restrictive lung disease    PFTs 06/2012 (FVC 54% predicted and FEV1 68% predicted w minimal bronchodilator response).  . Shortness of breath   . Venous stasis ulcer ([place]    chornic, ?followed up at wound care center, multiple courses of antibiotics in past for cellulitis, on lasix    Patient Active Problem List   Diagnosis Date Noted  . Postmenopausal vaginal bleeding 03/21/2015  . Peripheral neuropathy ([place] 09/27/2014  . Healthcare maintenance 09/27/2014  . Bilateral leg paresthesia 09/24/2014  . Left knee pain 07/23/2014  . Microcytic anemia 07/22/2014  . Abnormal stress test 02/21/2014  . Chronic diastolic CHF (congestive heart failure) ([place] 06/25/2013  . Pulmonary HTN 09/02/2012  . Hypokalemia 09/02/2012  . Atrial flutter ([place] 08/17/2012  . Hepatitis B core antibody positive 06/29/2012  . Restrictive lung disease 06/28/2012  . Right heart failure, NYHA class 3 03/15/2012  . Allergic rhinitis 11/16/2011  . Diabetes (  [place]) 10/15/2010  . Hyperlipidemia 10/15/2010  . Obesity 10/14/2010  . Obstructive sleep apnea 02/21/2009  . GERD 11/29/2005  . ANEMIA, B12 DEFICIENCY 10/19/2005  . MENTAL RETARDATION 10/19/2005    Past Surgical History:  Procedure Laterality Date  . CARDIAC CATHETERIZATION N/A 01/13/2016   Procedure: Right Heart Cath;  Surgeon: Jolaine Artist, MD;  Location: [facility] CV LAB;  Service: Cardiovascular;  Laterality: N/A;  . CHOLECYSTECTOMY    . LEFT AND RIGHT HEART CATHETERIZATION WITH CORONARY ANGIOGRAM N/A  02/28/2014   Procedure: LEFT AND RIGHT HEART CATHETERIZATION WITH CORONARY ANGIOGRAM;  Surgeon: Jolaine Artist, MD;  Location: [facility] CATH LAB;  Service: Cardiovascular;  Laterality: N/A;    OB History    Gravida Para Term Preterm AB Living   _0 SAB TAB Ectopic Multiple Live Births                   Home Medications    Prior to Admission medications   Medication Sig Start Date End Date Taking? Authorizing Provider  acetaminophen (TYLENOL) 500 MG tablet Take 1 tablet (500 mg total) by mouth every 6 (six) hours as needed. Patient taking differently: Take 1,000 mg by mouth every 6 (six) hours as needed for moderate pain.  08/21/15  Yes [name], MD  cetirizine (ZYRTEC) 10 MG tablet TAKE ONE TABLET BY MOUTH ONCE DAILY 07/04/14  Yes Corky Sox, MD  cyanocobalamin 500 MCG tablet Take 500 mcg by mouth daily.   Yes Historical Provider, MD  diclofenac sodium (VOLTAREN) 1 % GEL Apply 4 g topically 4 (four) times daily. Patient taking differently: Apply 4 g topically 4 (four) times daily as needed (pain).  08/21/15  Yes [name], MD  diphenhydrAMINE (BENADRYL) 25 mg capsule Take 25-75 mg by mouth 2 (two) times daily as needed for itching.    Yes Historical Provider, MD  ferrous sulfate 325 (65 FE) MG tablet Take 1 tablet (325 mg total) by mouth daily with breakfast. 07/23/14  Yes Corky Sox, MD  gabapentin (NEURONTIN) 300 MG capsule TAKE TWO CAPSULES BY MOUTH THREE TIMES DAILY 12/11/15  Yes Minus Liberty, MD  glucose blood (ACCU-CHEK AVIVA PLUS) test strip Use  To check blood sugar up to 1 time a day 07/25/15  Yes [name], MD  ibuprofen (ADVIL,MOTRIN) 200 MG tablet Take 600 mg by mouth every 6 (six) hours as needed for moderate pain.   Yes Historical Provider, MD  metFORMIN (GLUCOPHAGE) 1000 MG tablet Please take 1/2 tablet daily with breakfast Patient taking differently: Take 500 mg by mouth daily with breakfast.  09/25/15  Yes Minus Liberty, MD    pantoprazole (PROTONIX) 20 MG tablet Take 1 tablet (20 mg total) by mouth 2 (two) times daily. 09/25/15  Yes Minus Liberty, MD  potassium chloride SA (KLOR-CON M20) 20 MEQ tablet TAKE TWO TABLETS BY MOUTH THREE TIMES DAILY, TAKE EXTRA TABLET WHEN TAKING AN EXTRA TORESMIDE Patient taking differently: Take 40 mEq by mouth 3 (three) times daily.  05/13/15  Yes Corky Sox, MD  pravastatin (PRAVACHOL) 20 MG tablet Take 1 tablet (20 mg total) by mouth daily. 09/25/15  Yes Minus Liberty, MD  rivaroxaban (XARELTO) 20 MG TABS tablet Take 1 tablet (20 mg total) by mouth daily with supper. 01/07/16  Yes Minus Liberty, MD  sildenafil (REVATIO) 20 MG tablet Take 1 tablet (20 mg total) by mouth 3 (three) times daily. 10/20/15  Yes Daniel R Bensimhon,  MD  spironolactone (ALDACTONE) 25 MG tablet Take 0.5 tablets (12.5 mg total) by mouth daily. 09/25/15  Yes Minus Liberty, MD  torsemide (DEMADEX) 20 MG tablet TAKE THREE TABLETS BY MOUTH ONCE DAILY 08/04/15  Yes Sid Falcon, MD    Family History Family History  Problem Relation Age of Onset  . Mental illness Sister   . Mental retardation Brother     Social History Social History  Substance Use Topics  . Smoking status: Never Smoker  . Smokeless tobacco: Never Used  . Alcohol use No  lives at home On oxygen 4 lpm [place] Lives with son and roomate   Allergies   Aspirin; Codeine; Lisinopril; Sulfonamide derivatives; and Latex  Review of Systems Review of Systems  Constitutional: Positive for chills.  HENT: Positive for rhinorrhea. Negative for sore throat.        +dry mouth  Respiratory: Positive for cough.   Cardiovascular: Negative for leg swelling.  Gastrointestinal: Positive for abdominal pain, nausea and vomiting. Negative for diarrhea.  All other systems reviewed and are negative.  Physical Exam Updated Vital Signs BP 107/69 (BP Location: Left Arm)   Pulse 103   Temp 98.9 F (37.2 C) (Oral)   Resp 18   Ht 5' 3.5"  (1.613 m)   Wt 264 lb (119.7 kg)   LMP 02/19/2011   SpO2 94%   BMI 46.03 kg/m   Vital signs normal    Physical Exam  Constitutional: She is oriented to person, place, and time. She appears well-developed and well-nourished.  Non-toxic appearance. She does not appear ill. No distress.  HENT:  Head: Normocephalic and atraumatic.  Right Ear: External ear normal.  Left Ear: External ear normal.  Nose: Nose normal. No mucosal edema or rhinorrhea.  Mouth/Throat: Mucous membranes are normal. No dental abscesses or uvula swelling.  Tongue dry  Eyes: Conjunctivae and EOM are normal. Pupils are equal, round, and reactive to light.  Neck: Normal range of motion and full passive range of motion without pain. Neck supple.  Cardiovascular: Normal rate, regular rhythm and normal heart sounds.  Exam reveals no gallop and no friction rub.   No murmur heard. Pulmonary/Chest: Effort normal. No respiratory distress. She has decreased breath sounds. She has no wheezes. She has no rhonchi. She has no rales. She exhibits no tenderness and no crepitus.  Diminished breath sounds  Frequent coughing, sounds tight  Abdominal: Soft. Normal appearance and bowel sounds are normal. She exhibits no distension. There is no tenderness. There is no rebound and no guarding.  Musculoskeletal: Normal range of motion. She exhibits edema. She exhibits no tenderness.  Trace pitting edema to BLE Moves all extremities well.   Neurological: She is alert and oriented to person, place, and time. She has normal strength. No cranial nerve deficit.  Skin: Skin is warm, dry and intact. No rash noted. No erythema. No pallor.  Psychiatric: She has a normal mood and affect. Her speech is normal and behavior is normal. Her mood appears not anxious.  Nursing note and vitals reviewed.   ED Treatments / Results  DIAGNOSTIC STUDIES: Oxygen Saturation is 94% on RA, adequate by my interpretation.    Labs (all labs ordered are listed,  but only abnormal results are displayed) Results for orders placed or performed during the hospital encounter of 02/09/16  Comprehensive metabolic panel  Result Value Ref Range   Sodium 135 135 - 145 mmol/L   Potassium 3.5 3.5 - 5.1 mmol/L   Chloride 99 (L)  101 - 111 mmol/L   CO2 24 22 - 32 mmol/L   Glucose, Bld 104 (H) 65 - 99 mg/dL   BUN 13 6 - 20 mg/dL   Creatinine, Ser 1.00 0.44 - 1.00 mg/dL   Calcium 8.3 (L) 8.9 - 10.3 mg/dL   Total Protein 6.9 6.5 - 8.1 g/dL   Albumin 3.7 3.5 - 5.0 g/dL   AST 22 15 - 41 U/L   ALT 18 14 - 54 U/L   Alkaline Phosphatase 75 38 - 126 U/L   Total Bilirubin 0.5 0.3 - 1.2 mg/dL   GFR calc non Af Amer >60 >60 mL/min   GFR calc Af Amer >60 >60 mL/min   Anion gap 12 5 - 15  CBC with Differential  Result Value Ref Range   WBC 9.2 4.0 - 10.5 K/uL   RBC 4.80 3.87 - 5.11 MIL/uL   Hemoglobin 13.0 12.0 - 15.0 g/dL   HCT 40.8 36.0 - 46.0 %   MCV 85.0 78.0 - 100.0 fL   MCH 27.1 26.0 - 34.0 pg   MCHC 31.9 30.0 - 36.0 g/dL   RDW 16.6 (H) 11.5 - 15.5 %   Platelets 414 (H) 150 - 400 K/uL   Neutrophils Relative % 74 %   Neutro Abs 6.9 1.7 - 7.7 K/uL   Lymphocytes Relative 15 %   Lymphs Abs 1.4 0.7 - 4.0 K/uL   Monocytes Relative 11 %   Monocytes Absolute 1.0 0.1 - 1.0 K/uL   Eosinophils Relative 0 %   Eosinophils Absolute 0.0 0.0 - 0.7 K/uL   Basophils Relative 0 %   Basophils Absolute 0.0 0.0 - 0.1 K/uL  Urinalysis, Routine w reflex microscopic  Result Value Ref Range   Color, Urine STRAW (A) YELLOW   APPearance CLEAR CLEAR   Specific Gravity, Urine 1.008 1.005 - 1.030   pH 5.0 5.0 - 8.0   Glucose, UA NEGATIVE NEGATIVE mg/dL   Hgb urine dipstick MODERATE (A) NEGATIVE   Bilirubin Urine NEGATIVE NEGATIVE   Ketones, ur NEGATIVE NEGATIVE mg/dL   Protein, ur NEGATIVE NEGATIVE mg/dL   Nitrite NEGATIVE NEGATIVE   Leukocytes, UA NEGATIVE NEGATIVE   RBC / HPF 0-5 0 - 5 RBC/hpf   WBC, UA 0-5 0 - 5 WBC/hpf   Bacteria, UA RARE (A) NONE SEEN   Squamous  Epithelial / LPF 0-5 (A) NONE SEEN  I-Stat CG4 Lactic Acid, ED  Result Value Ref Range   Lactic Acid, Venous 1.89 0.5 - 1.9 mmol/L  I-Stat CG4 Lactic Acid, ED  Result Value Ref Range   Lactic Acid, Venous 0.93 0.5 - 1.9 mmol/L   Laboratory interpretation all normal     EKG  EKG Interpretation None       Radiology Dg Chest 2 View  Result Date: 02/09/2016 CLINICAL DATA:  Cough and vomiting blood EXAM: CHEST  2 VIEW COMPARISON:  06/25/2013 FINDINGS: A right-sided central venous catheter tip overlies the SVC. Increased opacity at the left lung base may reflect an infiltrate. No large pleural effusion. Mild cardiomegaly with central vascular congestion. No pneumothorax. Degenerative changes of the spine. IMPRESSION: 1. Increased opacity at the left lung base could reflect an infiltrate 2. Cardiomegaly with mild central vascular congestion Electronically Signed   By: Donavan Foil M.D.   On: 02/09/2016 23:39    Procedures Procedures (including critical care time)  Medications Ordered in ED Medications  vancomycin (VANCOCIN) IVPB 1000 mg/200 mL premix (not administered)  piperacillin-tazobactam (ZOSYN) IVPB 3.375 g (3.375 g  Intravenous New Bag/Given 02/10/16 0203)  albuterol (PROVENTIL) (2.5 MG/3ML) 0.083% nebulizer solution 5 mg (5 mg Nebulization Given 02/10/16 0157)  ipratropium (ATROVENT) nebulizer solution 0.5 mg (0.5 mg Nebulization Given 02/10/16 0157)     Initial Impression / Assessment and Plan / ED Course  I have reviewed the triage vital signs and the nursing notes.  Pertinent labs & imaging results that were available during my care of the patient were reviewed by me and considered in my medical decision making (see chart for details).     COORDINATION OF CARE: 12:14 AM Discussed treatment plan with pt at bedside which includes breathing treatment which she states she has done in the past, blood culture, CBC, CMP, and Urinalysis and pt agreed to plan. We discussed her chest  x-ray which was suggestive for pneumonia. She was started on healthcare associated pneumonia Antibiotics she was admitted to the hospital on January 9.  1:59 AM: Recheck; pt was just now starting breathing treatment.  2:23 AM: Recheck; pt has improved air movement with rare rhonchi following breathing treatment.  Patient has COPD, pulmonary hypertension on IV Malarone and home oxygen, congestive heart failure presents with pneumonia with some exacerbation of her COPD. Patient is at high risk for having problems with her pneumonia. She should admitted. Patient is agreeable.  02:40 AM Dr Aundra Dubin, cardiology feels her primary care doctors can admit  02:54 Dr Jeremy Johann, internal medicine resident, will see patient for admission  Final Clinical Impressions(s) / ED Diagnoses   Final diagnoses:  Pneumonia of left lower lobe due to infectious organism [facility])  Pulmonary hypertension  COPD exacerbation ([place])    Plan admission  Rolland Porter, MD, FACEP    I personally performed the services described in this documentation, which was scribed in my presence. The recorded information has been reviewed and considered.  Rolland Porter, MD, FACEP   Anatomy  .   Rolland Porter, MD 02/10/16 [phone]

## 2016-02-09 NOTE — ED Triage Notes (Signed)
Pt states she got flu shot on 02/02/16 but started to experience flu sx on this past Friday; pt states  Productivecough, chills, and nausea; pt a&ox4 on arrival. Pt on 4 L O2 at home; pt has hx of COPD;

## 2016-02-09 NOTE — Telephone Encounter (Signed)
Please call and schedule ACC appointment.  With her chronic pulmonary disease, she is high risk of influenza complications.

## 2016-02-09 NOTE — Telephone Encounter (Signed)
Questions about med. Please call back.

## 2016-02-09 NOTE — Telephone Encounter (Signed)
Pt calls and states she has coughing, sneezing, sick to stomach and body aches, this started Friday, she is very worried. Please advise. Had flu vaccine 1/29

## 2016-02-09 NOTE — Telephone Encounter (Signed)
Made appt per dr Inda Castle for 2/6 at 1015 acc

## 2016-02-10 ENCOUNTER — Ambulatory Visit: Payer: Self-pay

## 2016-02-10 DIAGNOSIS — Z810 Family history of intellectual disabilities: Secondary | ICD-10-CM

## 2016-02-10 DIAGNOSIS — I2721 Secondary pulmonary arterial hypertension: Secondary | ICD-10-CM | POA: Diagnosis not present

## 2016-02-10 DIAGNOSIS — I2729 Other secondary pulmonary hypertension: Secondary | ICD-10-CM | POA: Diagnosis not present

## 2016-02-10 DIAGNOSIS — E8770 Fluid overload, unspecified: Secondary | ICD-10-CM | POA: Diagnosis not present

## 2016-02-10 DIAGNOSIS — Z79899 Other long term (current) drug therapy: Secondary | ICD-10-CM | POA: Diagnosis not present

## 2016-02-10 DIAGNOSIS — Z6841 Body Mass Index (BMI) 40.0 and over, adult: Secondary | ICD-10-CM | POA: Diagnosis not present

## 2016-02-10 DIAGNOSIS — I480 Paroxysmal atrial fibrillation: Secondary | ICD-10-CM | POA: Diagnosis not present

## 2016-02-10 DIAGNOSIS — Z9981 Dependence on supplemental oxygen: Secondary | ICD-10-CM

## 2016-02-10 DIAGNOSIS — K219 Gastro-esophageal reflux disease without esophagitis: Secondary | ICD-10-CM | POA: Diagnosis present

## 2016-02-10 DIAGNOSIS — J1008 Influenza due to other identified influenza virus with other specified pneumonia: Secondary | ICD-10-CM | POA: Diagnosis not present

## 2016-02-10 DIAGNOSIS — E785 Hyperlipidemia, unspecified: Secondary | ICD-10-CM | POA: Diagnosis not present

## 2016-02-10 DIAGNOSIS — Z886 Allergy status to analgesic agent status: Secondary | ICD-10-CM | POA: Diagnosis not present

## 2016-02-10 DIAGNOSIS — R0602 Shortness of breath: Secondary | ICD-10-CM | POA: Diagnosis present

## 2016-02-10 DIAGNOSIS — G4733 Obstructive sleep apnea (adult) (pediatric): Secondary | ICD-10-CM | POA: Diagnosis present

## 2016-02-10 DIAGNOSIS — J189 Pneumonia, unspecified organism: Secondary | ICD-10-CM

## 2016-02-10 DIAGNOSIS — I4892 Unspecified atrial flutter: Secondary | ICD-10-CM

## 2016-02-10 DIAGNOSIS — Z9989 Dependence on other enabling machines and devices: Secondary | ICD-10-CM | POA: Diagnosis not present

## 2016-02-10 DIAGNOSIS — I5082 Biventricular heart failure: Secondary | ICD-10-CM | POA: Diagnosis not present

## 2016-02-10 DIAGNOSIS — J441 Chronic obstructive pulmonary disease with (acute) exacerbation: Secondary | ICD-10-CM | POA: Diagnosis not present

## 2016-02-10 DIAGNOSIS — I5032 Chronic diastolic (congestive) heart failure: Secondary | ICD-10-CM | POA: Diagnosis not present

## 2016-02-10 DIAGNOSIS — Z7984 Long term (current) use of oral hypoglycemic drugs: Secondary | ICD-10-CM

## 2016-02-10 DIAGNOSIS — F329 Major depressive disorder, single episode, unspecified: Secondary | ICD-10-CM | POA: Diagnosis present

## 2016-02-10 DIAGNOSIS — E1140 Type 2 diabetes mellitus with diabetic neuropathy, unspecified: Secondary | ICD-10-CM | POA: Diagnosis not present

## 2016-02-10 DIAGNOSIS — Z818 Family history of other mental and behavioral disorders: Secondary | ICD-10-CM

## 2016-02-10 DIAGNOSIS — J181 Lobar pneumonia, unspecified organism: Secondary | ICD-10-CM

## 2016-02-10 DIAGNOSIS — I50812 Chronic right heart failure: Secondary | ICD-10-CM | POA: Diagnosis not present

## 2016-02-10 DIAGNOSIS — Z7901 Long term (current) use of anticoagulants: Secondary | ICD-10-CM | POA: Diagnosis not present

## 2016-02-10 DIAGNOSIS — J1000 Influenza due to other identified influenza virus with unspecified type of pneumonia: Secondary | ICD-10-CM | POA: Diagnosis not present

## 2016-02-10 DIAGNOSIS — Z9049 Acquired absence of other specified parts of digestive tract: Secondary | ICD-10-CM | POA: Diagnosis not present

## 2016-02-10 DIAGNOSIS — B9789 Other viral agents as the cause of diseases classified elsewhere: Secondary | ICD-10-CM | POA: Diagnosis not present

## 2016-02-10 DIAGNOSIS — E876 Hypokalemia: Secondary | ICD-10-CM | POA: Diagnosis present

## 2016-02-10 DIAGNOSIS — J440 Chronic obstructive pulmonary disease with acute lower respiratory infection: Secondary | ICD-10-CM | POA: Diagnosis not present

## 2016-02-10 DIAGNOSIS — I110 Hypertensive heart disease with heart failure: Secondary | ICD-10-CM | POA: Diagnosis not present

## 2016-02-10 LAB — BASIC METABOLIC PANEL
Anion gap: 12 (ref 5–15)
BUN: 14 mg/dL (ref 6–20)
CHLORIDE: 99 mmol/L — AB (ref 101–111)
CO2: 26 mmol/L (ref 22–32)
CREATININE: 1.07 mg/dL — AB (ref 0.44–1.00)
Calcium: 8.4 mg/dL — ABNORMAL LOW (ref 8.9–10.3)
GFR calc non Af Amer: 58 mL/min — ABNORMAL LOW (ref >60)
GLUCOSE: 99 mg/dL (ref 65–99)
Potassium: 3.3 mmol/L — ABNORMAL LOW (ref 3.5–5.1)
Sodium: 137 mmol/L (ref 135–145)

## 2016-02-10 LAB — CBC
HCT: 41.4 % (ref 36.0–46.0)
Hemoglobin: 13.1 g/dL (ref 12.0–15.0)
MCH: 27 pg (ref 26.0–34.0)
MCHC: 31.6 g/dL (ref 30.0–36.0)
MCV: 85.2 fL (ref 78.0–100.0)
PLATELETS: 368 10*3/uL (ref 150–400)
RBC: 4.86 MIL/uL (ref 3.87–5.11)
RDW: 16.8 % — ABNORMAL HIGH (ref 11.5–15.5)
WBC: 9.2 10*3/uL (ref 4.0–10.5)

## 2016-02-10 LAB — INFLUENZA PANEL BY PCR (TYPE A & B)
INFLBPCR: NEGATIVE
Influenza A By PCR: POSITIVE — AB

## 2016-02-10 LAB — GLUCOSE, CAPILLARY
GLUCOSE-CAPILLARY: 128 mg/dL — AB (ref 65–99)
GLUCOSE-CAPILLARY: 114 mg/dL — AB (ref 65–99)
GLUCOSE-CAPILLARY: 96 mg/dL (ref 65–99)
Glucose-Capillary: 99 mg/dL (ref 65–99)

## 2016-02-10 LAB — STREP PNEUMONIAE URINARY ANTIGEN: Strep Pneumo Urinary Antigen: NEGATIVE

## 2016-02-10 LAB — MRSA PCR SCREENING: MRSA by PCR: POSITIVE — AB

## 2016-02-10 LAB — I-STAT CG4 LACTIC ACID, ED: LACTIC ACID, VENOUS: 0.93 mmol/L (ref 0.5–1.9)

## 2016-02-10 LAB — HIV ANTIBODY (ROUTINE TESTING W REFLEX): HIV SCREEN 4TH GENERATION: NONREACTIVE

## 2016-02-10 MED ORDER — SPIRONOLACTONE 25 MG PO TABS
12.5000 mg | ORAL_TABLET | Freq: Every day | ORAL | Status: DC
  Administered 2016-02-10 – 2016-02-12 (×3): 12.5 mg via ORAL
  Filled 2016-02-10 (×3): qty 1

## 2016-02-10 MED ORDER — IPRATROPIUM-ALBUTEROL 0.5-2.5 (3) MG/3ML IN SOLN
3.0000 mL | Freq: Two times a day (BID) | RESPIRATORY_TRACT | Status: DC
Start: 2016-02-10 — End: 2016-02-12
  Administered 2016-02-10 – 2016-02-12 (×4): 3 mL via RESPIRATORY_TRACT
  Filled 2016-02-10 (×5): qty 3

## 2016-02-10 MED ORDER — TORSEMIDE 20 MG PO TABS: 20.0000 mg | ORAL_TABLET | Freq: Every day | ORAL | Status: DC

## 2016-02-10 MED ORDER — DM-GUAIFENESIN ER 30-600 MG PO TB12
1.0000 | ORAL_TABLET | Freq: Two times a day (BID) | ORAL | Status: DC
  Administered 2016-02-10 – 2016-02-12 (×5): 1 via ORAL
  Filled 2016-02-10 (×5): qty 1

## 2016-02-10 MED ORDER — TORSEMIDE 20 MG PO TABS
60.0000 mg | ORAL_TABLET | Freq: Every day | ORAL | Status: DC
  Administered 2016-02-10 – 2016-02-12 (×3): 60 mg via ORAL
  Filled 2016-02-10 (×3): qty 3

## 2016-02-10 MED ORDER — BENZONATATE 100 MG PO CAPS
100.0000 mg | ORAL_CAPSULE | Freq: Two times a day (BID) | ORAL | Status: DC
  Administered 2016-02-10 – 2016-02-12 (×6): 100 mg via ORAL
  Filled 2016-02-10 (×6): qty 1

## 2016-02-10 MED ORDER — PIPERACILLIN-TAZOBACTAM 3.375 G IVPB 30 MIN
3.3750 g | Freq: Once | INTRAVENOUS | Status: AC
  Administered 2016-02-10: 3.375 g via INTRAVENOUS
  Filled 2016-02-10: qty 50

## 2016-02-10 MED ORDER — ALBUTEROL SULFATE (2.5 MG/3ML) 0.083% IN NEBU
5.0000 mg | INHALATION_SOLUTION | Freq: Once | RESPIRATORY_TRACT | Status: AC
  Administered 2016-02-10: 5 mg via RESPIRATORY_TRACT
  Filled 2016-02-10: qty 6

## 2016-02-10 MED ORDER — DEXTROSE 5 % IV SOLN
1.0000 g | INTRAVENOUS | Status: DC
  Administered 2016-02-10 – 2016-02-12 (×3): 1 g via INTRAVENOUS
  Filled 2016-02-10 (×3): qty 10

## 2016-02-10 MED ORDER — SILDENAFIL CITRATE 20 MG PO TABS
20.0000 mg | ORAL_TABLET | Freq: Three times a day (TID) | ORAL | Status: DC
  Administered 2016-02-10 – 2016-02-12 (×7): 20 mg via ORAL
  Filled 2016-02-10 (×8): qty 1

## 2016-02-10 MED ORDER — AZITHROMYCIN 500 MG PO TABS
500.0000 mg | ORAL_TABLET | ORAL | Status: DC
  Administered 2016-02-10 – 2016-02-12 (×3): 500 mg via ORAL
  Filled 2016-02-10 (×3): qty 1

## 2016-02-10 MED ORDER — RIVAROXABAN 20 MG PO TABS
20.0000 mg | ORAL_TABLET | Freq: Every day | ORAL | Status: DC
  Administered 2016-02-10 – 2016-02-11 (×2): 20 mg via ORAL
  Filled 2016-02-10 (×2): qty 1

## 2016-02-10 MED ORDER — OSELTAMIVIR PHOSPHATE 75 MG PO CAPS
75.0000 mg | ORAL_CAPSULE | Freq: Two times a day (BID) | ORAL | Status: DC
  Administered 2016-02-10 – 2016-02-12 (×5): 75 mg via ORAL
  Filled 2016-02-10 (×5): qty 1

## 2016-02-10 MED ORDER — IPRATROPIUM BROMIDE 0.02 % IN SOLN
0.5000 mg | Freq: Once | RESPIRATORY_TRACT | Status: AC
  Administered 2016-02-10: 0.5 mg via RESPIRATORY_TRACT
  Filled 2016-02-10: qty 2.5

## 2016-02-10 MED ORDER — GABAPENTIN 300 MG PO CAPS
600.0000 mg | ORAL_CAPSULE | Freq: Three times a day (TID) | ORAL | Status: DC
  Administered 2016-02-10 – 2016-02-12 (×7): 600 mg via ORAL
  Filled 2016-02-10 (×7): qty 2

## 2016-02-10 MED ORDER — ACETAMINOPHEN 500 MG PO TABS
500.0000 mg | ORAL_TABLET | Freq: Four times a day (QID) | ORAL | Status: DC | PRN
  Administered 2016-02-10 – 2016-02-12 (×3): 500 mg via ORAL
  Filled 2016-02-10 (×3): qty 1

## 2016-02-10 MED ORDER — POTASSIUM CHLORIDE CRYS ER 20 MEQ PO TBCR
20.0000 meq | EXTENDED_RELEASE_TABLET | Freq: Every day | ORAL | Status: DC
  Administered 2016-02-10 – 2016-02-11 (×2): 20 meq via ORAL
  Filled 2016-02-10 (×2): qty 1

## 2016-02-10 MED ORDER — INSULIN ASPART 100 UNIT/ML ~~LOC~~ SOLN: 0.0000 [IU] | Freq: Three times a day (TID) | SUBCUTANEOUS | Status: DC

## 2016-02-10 MED ORDER — POTASSIUM CHLORIDE CRYS ER 20 MEQ PO TBCR: 40.0000 meq | EXTENDED_RELEASE_TABLET | Freq: Once | ORAL | Status: DC

## 2016-02-10 MED ORDER — VANCOMYCIN HCL IN DEXTROSE 1-5 GM/200ML-% IV SOLN
1000.0000 mg | Freq: Once | INTRAVENOUS | Status: DC
  Administered 2016-02-10: 1000 mg via INTRAVENOUS
  Filled 2016-02-10: qty 200

## 2016-02-10 NOTE — ED Notes (Signed)
Nurse starting IV and will get 2nd set of blood cultures

## 2016-02-10 NOTE — Care Management Note (Signed)
Case Management Note Marvetta Gibbons RN, BSN Unit 2W-Case Manager [phone]  Patient Details  Name: FRANCHON KETTERMAN MRN: [id number] Date of Birth: July 24, 1960  Subjective/Objective:  Pt presented with +flu                   Action/Plan: PTA pt lived at home with son, per pt she has home 51 with [facility] (baseline 4L), she also has a cane and RW at home, plan to return home, CM to follow.   Expected Discharge Date:                  Expected Discharge Plan:  Home/Self Care  In-House Referral:     Discharge planning Services  CM Consult  Post Acute Care Choice:  NA Choice offered to:  NA  DME Arranged:    DME Agency:     HH Arranged:    HH Agency:     Status of Service:  In process, will continue to follow  If discussed at Long Length of Stay Meetings, dates discussed:    Additional Comments:  Dawayne Patricia, RN 02/10/2016, 11:01 AM

## 2016-02-10 NOTE — Progress Notes (Signed)
   Subjective: Patient was complaining of worsening cough. She states that there is no change in her symptoms since admission early morning today.   Objective:  Vital signs in last 24 hours: Vitals:   02/10/16 0400 02/10/16 0409 02/10/16 0446 02/10/16 0941  BP: 115/69  132/74   Pulse: 111  (!) 105   Resp:   18   Temp:   98.4 F (36.9 C)   TempSrc:   Oral   SpO2: 90% 96% 95% 95%  Weight:   264 lb (119.7 kg)   Height:       Gen. chronically ill-looking, morbidly obese lady, alert and oriented, in no acute distress. Chest. Mostly clear except a few scattered expiratory wheeze on anterior auscultation. CVS. Regular rate and rhythm. Abdomen. Soft, obese, bowel sounds positive. Extremities. 1+ pitting edema bilaterally up to mid leg, with signs of stasis bilaterally.  Assessment/Plan:  [name] is a 56 y.o. female with a h/o of DM, h/o A Flutter, PAH, and CHF who presents with for a 1 week h/o worsening cough and SOB.  Influenza A positive with pneumonia.(LLL) Patient remained afebrile, without leukocytosis. Worsening cough as her main symptom. She is positive for influenza A and MRSA PCR. -We will monitor her today, if she deteriorates in respect of developing fever or leukocytosis, can consider MRSA coverage. -Continue with azithromycin and ceftriaxone for CAP. -Tami-flu -Continue DuoNeb. -Trend CBC -Mucinex DM-don't have much options because of her allergies.  Pulmonary hypertension. She followed by pulmonology at [facility]. -Continue her continuous infusion of treprostinil + sildenafil 83m.  Right heart failure. Most likely due to pulmonary hypertension. Clinically she does not look volume overload except chronic 1+ pitting edema. She is preload dependent. -Continue her home dose of torsemide and spironolactone.  Hypokalemia. Her potassium this morning was 3.3. -Replace with potassium chloride tablet 40 mEq. -Continue home dose of potassium chloride 20 mEq  daily. -Repeat BMP tomorrow morning.  Paroxysmal Atrial Flutter. Currently no complaints. Mildly tachycardic. -Continue home dose of Xarelto.  Type 2 diabetes. Well controlled according to last A1c few days ago. -SSI.  Diabetic neuropathy. -Continue home dose of Neurontin 600 mg 3 times daily.  OSA. -Continue CPAP at night.   Dispo: Anticipated discharge in approximately 1-2 day(s).   SLorella Nimrod MD 02/10/2016, 11:49 AM Pager: [phone]

## 2016-02-10 NOTE — Consult Note (Signed)
Patient arrived at Emergency Department with complaints of cough that has gotten progressively worse over the past week.  Cough is non-productive.  Patient states she has severe pain in her "belly" when she has these coughing episodes.  She becomes short-of-breath while coughing.  Complicating the picture, patient also has allergy to codeine from around 10 years ago when "her lips began to tingle and breathing became difficult while she was at the dentist".  In patients with a true allergy to codeine, hydromorphone as an antitussive should not be used because it is in the same chemical class as codeine.  Antitussive options for this patient include dextromethorphan and diphenhydramine.  An expectorant that could be used for this patient is guaifenesin.  Neither dextromethorphan nor guaifenesin have strong data to back up their use as cough suppressants.  Medical resident has elected to start dextromethorphan 59m-guaifenesin 6014mbid along with benzonatate 10040mid in order to minimize the frequency of coughing attacks experienced by the patient.  Dalton HudWinn-DixieamCrestonoctor of Pharmacy Candidate, 2018

## 2016-02-10 NOTE — H&P (Signed)
Date: 02/10/2016               Patient Name:  [name] MRN: [id number]  DOB: 16-Aug-1960 Age / Sex: 56 y.o., female   PCP: Minus Liberty, MD         Medical Service: Internal Medicine Teaching Service         Attending Physician: Dr. Oval Linsey, MD    First Contact: Dr. Reesa Chew Pager: [phone]  Second Contact: Dr. Sherrye Payor Pager: [phone]       After Hours (After 5p/  First Contact Pager: [phone]  weekends / holidays): Second Contact Pager: [phone]   Chief Complaint: Cough  History of Present Illness: [name] is a 56 y.o. female with a h/o of DM, h/o A Flutter, PAH, and CHF who presents with for a 1 week h/o worsening cough and SOB.  She was seen in the outpatient clinic one week ago for routine checkup. She received the flu shot at this time. The next day she began to feel poorly and noticed that she was coughing more. Over the next week she began to get worse in terms of cough. She is not coughing up any sputum. She complains that she coughs so significantly that she has severe pain in her belly muscle spasms. She notes that she gets significantly short of breath when she is has coughing fits. She also notes that she has been having worsening orthopnea, most recently on Sunday night having to sit upright in bed in order to sleep. Patient denies any significant weight gain. She reports she's been taking her home medications regularly including her Lasix and medications for pulmonary hypertension.  Patient denies any significant myalgias in the upper and lower extremities, she denies any chest pain other than with coughing. She has had several episodes of loose stool but no significant diarrhea. She's had mild nausea but no vomiting. She denies any fevers or chills.  Patient presents the emergency department because she noted that the pain in her belly with significant coughing became too much for her to bear. In the emergency department she had evaluation with  chest x-ray which was concerning for left lower lobe pneumonia. Laboratory evaluation was negative for any leukocytosis and electrolytes were unremarkable. Patient did not have a lactic acidosis and did not appear septic, although her blood pressures were somewhat soft in the low 100s over 50s and 60s. Patient is on home O2 and was satting in the mid 90s on 4 L which she takes at home.  Meds:  (Not in a hospital admission) Allergies: Allergies as of 02/09/2016 - Review Complete 02/09/2016  Allergen Reaction Noted  . Aspirin  12/09/2005  . Codeine    . Lisinopril  05/02/2012  . Sulfonamide derivatives  10/19/2005  . Latex Rash 01/07/2016   Past Medical History:  Diagnosis Date  . Anemia, iron deficiency    secondary to menhorrhagia, on oral iron, also b12 def, getting monthly b12 shots  . CHF (congestive heart failure) ([place])   . Chronic cough    secondary to alleriges and post nasal drip  . Cognitive impairment   . COPD (chronic obstructive pulmonary disease) ([place])   . Cor pulmonale (HCC)    PA Peak pressure 21mHg  . Depression   . Diabetes mellitus    well controlled on metformin  . Diastolic heart failure ([place]   . GERD (gastroesophageal reflux disease)   . H/O mental retardation   . Herpes   .  Hyperlipidemia   . Hypertension   . OSA (obstructive sleep apnea)    CPAP  . Pulmonary hypertension   . Renal disorder   . Restrictive lung disease    PFTs 06/2012 (FVC 54% predicted and FEV1 68% predicted w minimal bronchodilator response).  . Shortness of breath   . Venous stasis ulcer (HCC)    chornic, ?followed up at wound care center, multiple courses of antibiotics in past for cellulitis, on lasix    Family History: Family History  Problem Relation Age of Onset  . Mental illness Sister   . Mental retardation Brother    Social History: Social History  Substance Use Topics  . Smoking status: Never Smoker  . Smokeless tobacco: Never Used  . Alcohol use No  Pt lives  at home with her son.  Review of Systems: A complete ROS was negative except as per HPI. Review of Systems  Constitutional: Positive for malaise/fatigue. Negative for chills, diaphoresis, fever and weight loss.  HENT: Positive for congestion, ear pain, sinus pain and tinnitus. Negative for ear discharge, nosebleeds and sore throat.   Eyes: Negative for blurred vision.  Respiratory: Positive for cough and shortness of breath. Negative for hemoptysis, sputum production, wheezing and stridor.   Cardiovascular: Negative for chest pain and leg swelling.  Gastrointestinal: Positive for nausea. Negative for abdominal pain, constipation, diarrhea and vomiting.  Genitourinary: Negative for dysuria, frequency and urgency.  Musculoskeletal: Negative for myalgias.  Skin: Negative for rash.  Neurological: Negative for dizziness, tremors and headaches.  Endo/Heme/Allergies: Negative for polydipsia.  Psychiatric/Behavioral: The patient is not nervous/anxious.     Physical Exam: Vitals:   02/09/16 2251 02/09/16 2255 02/10/16 0145 02/10/16 0230  BP:  107/69 107/60 103/57  Pulse:  103 95 101  Resp:  18    Temp:  98.9 F (37.2 C)    TempSrc:  Oral    SpO2: 94% 94% 96% 96%  Weight:  264 lb (119.7 kg)    Height:  5' 3.5" (1.613 m)     Physical Exam  Constitutional: She is oriented to person, place, and time. She is cooperative. She appears ill. No distress.  Morbidly obese, sitting in bed in NAD w/ O2, multiple fits of significant coughing during exam  HENT:  Head: Normocephalic and atraumatic.  Right Ear: Hearing normal.  Left Ear: Hearing normal.  Nose: Nose normal.  Mouth/Throat: Mucous membranes are normal.  Eyes: Right eye exhibits chemosis. Left eye exhibits chemosis. Right conjunctiva is injected. Left conjunctiva is injected.  Cardiovascular: Normal rate, regular rhythm, S1 normal, S2 normal and intact distal pulses.  Exam reveals no gallop.   No murmur heard. Pulmonary/Chest: Effort  normal and breath sounds normal. No respiratory distress. She has no wheezes. She has no rhonchi. She has no rales. She exhibits no tenderness. Breasts are symmetrical.  Abdominal: Soft. Normal appearance and bowel sounds are normal. She exhibits distension. She exhibits no fluid wave and no ascites. There is no hepatosplenomegaly. There is no tenderness. There is no CVA tenderness.  Musculoskeletal: Normal range of motion. She exhibits edema (1+ BLE).  Neurological: She is alert and oriented to person, place, and time. She has normal strength.  Skin: Skin is warm, dry and intact. She is not diaphoretic.  Psychiatric: She has a normal mood and affect. Her speech is normal and behavior is normal.   Labs: CBC:  Recent Labs Lab 02/09/16 2301  WBC 9.2  NEUTROABS 6.9  HGB 13.0  HCT 40.8  MCV 85.0  PLT 124*   Basic Metabolic Panel:  Recent Labs Lab 02/09/16 2301  NA 135  K 3.5  CL 99*  CO2 24  GLUCOSE 104*  BUN 13  CREATININE 1.00  CALCIUM 8.3*   Liver Function Tests:  Recent Labs Lab 02/09/16 2301  AST 22  ALT 18  ALKPHOS 75  BILITOT 0.5  PROT 6.9  ALBUMIN 3.7   CBG: Lab Results  Component Value Date   HGBA1C 6.0 02/02/2016   Imaging: Dg Chest 2 View  Result Date: 02/09/2016 CLINICAL DATA:  Cough and vomiting blood EXAM: CHEST  2 VIEW COMPARISON:  06/25/2013 FINDINGS: A right-sided central venous catheter tip overlies the SVC. Increased opacity at the left lung base may reflect an infiltrate. No large pleural effusion. Mild cardiomegaly with central vascular congestion. No pneumothorax. Degenerative changes of the spine. IMPRESSION: 1. Increased opacity at the left lung base could reflect an infiltrate 2. Cardiomegaly with mild central vascular congestion Electronically Signed   By: Donavan Foil M.D.   On: 02/09/2016 23:39   Assessment & Plan by Problem: Active Problems:   CAP (community acquired pneumonia)  [name] is a 55 y.o. female with t2DM,  PAH, OSA, CHF who presents w/ cough found to be 2/2 PNA.  1) LLL PNA and bronchitis: pt w/ LLL infiltrate on CXR and significant cough. Given Vanc/Zosyn in the ED for presumed PNA. Pt is afebrile, w/o leukocytosis. No myalgias, though influenza still possible. May have secondary bacterial CAP or significant viral PNA. Pulm exam reveals coarse ronchi bilaterally with good air movement throughout, no wheeze. Given pt's underliying pulm vascular disease, will likely benefit from close monitoring ON and IV Abx. BPs are soft, but measured with wrist cough. No AKI or chemistry abnormalities to suggest dehydration. Will plan to continue diuretics for hypervolemia as below, but follow pressures closely. - admit to tele - Influenza PCR - Empiric Abx for CAP, Ceftriaxone and Azithro - trend CBC in AM  2) Hypervolemia: CXR concerning for pulm edema and vasc congestion w/ worsening of BLE edema on exam. H/o CHF, but last echo in 11/2015 w/ preserved EF and no significant DD. She does have h/o severe PAH (moderate on last echo) which may be contributing to vasc congestion. Followed by HF on torsemide 8m, spironolactone 12.551m Has had recent wt gain not thought to be fluid over the last year. Is c/o orthopnea and BLE swelling today. Cardiology to see tomorrow for assessment of CHF and PAH. - cardiology c/s, appreciate recs - continue home diuretics - trend BMP in AM  3) Moderate [facility]. On continuous infusion of treprostinil + sildenafil 2017m- continue home meds  4) Paroxysmal Atrial Flutter: Followed by Cardiology. On anticoagulation w/ Xarelto 57m18montinue home meds.  5) t2DM: last A1c 6 1 week ago. Home meds: metformin 500mg42m Primary ppx: pravastatin 57mg 76mNeuropathy: gabapentin 600mg T25mContinue home meds.  DVT PPx - Xarelto  Code Status - Full  Consults Placed - Cardiology  Dispo: Admit patient to Observation with expected  length of stay less than 2 midnights.  Signed: Shatoria Stooksbury SHolley Raring6/2018, 3:41 AM  Pager: [phone]

## 2016-02-10 NOTE — ED Notes (Signed)
Delay in lab draw MD at bedside with pt

## 2016-02-11 DIAGNOSIS — I2729 Other secondary pulmonary hypertension: Secondary | ICD-10-CM

## 2016-02-11 DIAGNOSIS — E876 Hypokalemia: Secondary | ICD-10-CM

## 2016-02-11 DIAGNOSIS — J101 Influenza due to other identified influenza virus with other respiratory manifestations: Secondary | ICD-10-CM

## 2016-02-11 DIAGNOSIS — I50812 Chronic right heart failure: Secondary | ICD-10-CM

## 2016-02-11 DIAGNOSIS — E1140 Type 2 diabetes mellitus with diabetic neuropathy, unspecified: Secondary | ICD-10-CM

## 2016-02-11 DIAGNOSIS — G4733 Obstructive sleep apnea (adult) (pediatric): Secondary | ICD-10-CM

## 2016-02-11 DIAGNOSIS — J1008 Influenza due to other identified influenza virus with other specified pneumonia: Secondary | ICD-10-CM

## 2016-02-11 DIAGNOSIS — I110 Hypertensive heart disease with heart failure: Secondary | ICD-10-CM

## 2016-02-11 DIAGNOSIS — I480 Paroxysmal atrial fibrillation: Secondary | ICD-10-CM

## 2016-02-11 DIAGNOSIS — Z9989 Dependence on other enabling machines and devices: Secondary | ICD-10-CM

## 2016-02-11 DIAGNOSIS — Z7901 Long term (current) use of anticoagulants: Secondary | ICD-10-CM

## 2016-02-11 DIAGNOSIS — Z79899 Other long term (current) drug therapy: Secondary | ICD-10-CM

## 2016-02-11 DIAGNOSIS — Z794 Long term (current) use of insulin: Secondary | ICD-10-CM

## 2016-02-11 DIAGNOSIS — B9789 Other viral agents as the cause of diseases classified elsewhere: Secondary | ICD-10-CM

## 2016-02-11 DIAGNOSIS — J181 Lobar pneumonia, unspecified organism: Secondary | ICD-10-CM

## 2016-02-11 LAB — BASIC METABOLIC PANEL
ANION GAP: 11 (ref 5–15)
Anion gap: 12 (ref 5–15)
BUN: 12 mg/dL (ref 6–20)
BUN: 11 mg/dL (ref 6–20)
CO2: 30 mmol/L (ref 22–32)
CO2: 28 mmol/L (ref 22–32)
Calcium: 8.3 mg/dL — ABNORMAL LOW (ref 8.9–10.3)
Calcium: 8.5 mg/dL — ABNORMAL LOW (ref 8.9–10.3)
Chloride: 97 mmol/L — ABNORMAL LOW (ref 101–111)
Chloride: 99 mmol/L — ABNORMAL LOW (ref 101–111)
Creatinine, Ser: 0.9 mg/dL (ref 0.44–1.00)
Creatinine, Ser: 0.71 mg/dL (ref 0.44–1.00)
GFR calc Af Amer: >60 mL/min (ref >60)
GFR calc non Af Amer: >60 mL/min (ref >60)
Glucose, Bld: 102 mg/dL — ABNORMAL HIGH (ref 65–99)
Glucose, Bld: 110 mg/dL — ABNORMAL HIGH (ref 65–99)
POTASSIUM: 3 mmol/L — AB (ref 3.5–5.1)
Potassium: 3 mmol/L — ABNORMAL LOW (ref 3.5–5.1)
SODIUM: 138 mmol/L (ref 135–145)
Sodium: 139 mmol/L (ref 135–145)

## 2016-02-11 LAB — GLUCOSE, CAPILLARY
GLUCOSE-CAPILLARY: 107 mg/dL — AB (ref 65–99)
Glucose-Capillary: 95 mg/dL (ref 65–99)
Glucose-Capillary: 104 mg/dL — ABNORMAL HIGH (ref 65–99)
Glucose-Capillary: 112 mg/dL — ABNORMAL HIGH (ref 65–99)

## 2016-02-11 LAB — CBC
HCT: 39.7 % (ref 36.0–46.0)
Hemoglobin: 12.6 g/dL (ref 12.0–15.0)
MCH: 27 pg (ref 26.0–34.0)
MCHC: 31.7 g/dL (ref 30.0–36.0)
MCV: 85 fL (ref 78.0–100.0)
Platelets: 422 10*3/uL — ABNORMAL HIGH (ref 150–400)
RBC: 4.67 MIL/uL (ref 3.87–5.11)
RDW: 17.1 % — ABNORMAL HIGH (ref 11.5–15.5)
WBC: 6.5 10*3/uL (ref 4.0–10.5)

## 2016-02-11 LAB — MAGNESIUM: Magnesium: 2 mg/dL (ref 1.7–2.4)

## 2016-02-11 MED ORDER — POTASSIUM CHLORIDE CRYS ER 20 MEQ PO TBCR
40.0000 meq | EXTENDED_RELEASE_TABLET | Freq: Every day | ORAL | Status: DC
  Administered 2016-02-12: 40 meq via ORAL
  Filled 2016-02-11 (×2): qty 2

## 2016-02-11 MED ORDER — PROCHLORPERAZINE EDISYLATE 5 MG/ML IJ SOLN
10.0000 mg | Freq: Four times a day (QID) | INTRAMUSCULAR | Status: DC | PRN
  Administered 2016-02-11: 10 mg via INTRAVENOUS
  Filled 2016-02-11 (×2): qty 2

## 2016-02-11 MED ORDER — CHLORPHENIRAMINE MALEATE 4 MG PO TABS
4.0000 mg | ORAL_TABLET | ORAL | Status: DC | PRN
  Filled 2016-02-11: qty 1

## 2016-02-11 MED ORDER — POTASSIUM CHLORIDE CRYS ER 20 MEQ PO TBCR
40.0000 meq | EXTENDED_RELEASE_TABLET | Freq: Two times a day (BID) | ORAL | Status: AC
  Administered 2016-02-11 – 2016-02-12 (×2): 40 meq via ORAL
  Filled 2016-02-11 (×2): qty 2

## 2016-02-11 MED ORDER — POTASSIUM CHLORIDE 20 MEQ/15ML (10%) PO SOLN
60.0000 meq | Freq: Once | ORAL | Status: AC
  Administered 2016-02-11: 60 meq via ORAL
  Filled 2016-02-11: qty 45

## 2016-02-11 MED ORDER — MUPIROCIN 2 % EX OINT
1.0000 "application " | TOPICAL_OINTMENT | Freq: Two times a day (BID) | CUTANEOUS | Status: DC
  Administered 2016-02-11 – 2016-02-12 (×2): 1 via NASAL
  Filled 2016-02-11: qty 22

## 2016-02-11 MED ORDER — CHLORHEXIDINE GLUCONATE CLOTH 2 % EX PADS: 6.0000 | MEDICATED_PAD | Freq: Every day | CUTANEOUS | Status: DC

## 2016-02-11 MED ORDER — SALINE SPRAY 0.65 % NA SOLN: 1.0000 | NASAL | Status: DC | PRN

## 2016-02-11 MED ORDER — BROMPHENIRAMINE-PSEUDOEPH 1-15 MG/5ML PO ELIX: 5.0000 mL | ORAL_SOLUTION | Freq: Four times a day (QID) | ORAL | Status: DC | PRN

## 2016-02-11 NOTE — Consult Note (Signed)
Pharmacy students rounding with IMTS/B1-[facility] Service. Asked to provide a recommendation on transitioning patient to oral antibiotics upon discharge today. Patient presented to the Emergency Department on 02/10/16 with complaints of increased shortness of breath and coughing episodes over the past week.  While in the Emergency Department, a chest x-ray was suggestive of a possible left, lower lobe pneumonia.  Patient also tested positive for Influenza A.  Diagnosis of the patient is left, lower lobe pneumonia secondary to Influenza A.  Patient's current antibiotic regimen includes azithromycin 544m every 24 hours (day 2) and ceftriaxone 1g every 24 hours (day 2).  Patient is also on day 2 of Tamiflu 778mtwice daily for Influenza A.  Patient will likely be discharged today; therefore, would like to transition patient to oral therapy that will cover both streptococcus spp. and staphylococcus spp. We believe that cefdinir 3006mwice daily would be the most natural oral treatment when switching from ceftriaxone IV due to both antibiotics being 3rd generation cephalosporins.  Current therapy with azithromycin 500m62mery 24 hours should be continued in addition to cefdinir antibiotic treatment.  Patient counseling regarding dose(s), interval(s) and completion of the antibiotics will be provided to patient at bedside before discharge.   Dalton HudgWinn-D[employer]ie SaraCorpus Christitor of Pharmacy Candidates, 2018  I have reviewed and discussed the above note with the students as well as MichLegrand ComoarmD, BCPS, AAAHMentoneectious Disease Pharmacist.   JameJorene GuestarmD, CACP Professor of PharBridgeportPharmacy and [facility] Centernical Assistant Professor of Medicine, [facility]

## 2016-02-11 NOTE — Progress Notes (Signed)
[facility]  Patient Status: Active (receiving services up to time of hospitalization)  [facility] is providing the following services: RN  If patient discharges after hours, please call [phone].   [name] 02/11/2016, 9:39 AM

## 2016-02-11 NOTE — Progress Notes (Signed)
Patient placed on CPAP without complication.

## 2016-02-11 NOTE — Progress Notes (Signed)
Patient counseled on new medications, azithromycin and oseltamivir, regarding indications, administration, and potential side effects.  Patient counseled to contact physician if she begins to notice multiple watery stools and also to complete course of antiviral and antibiotic medication regardless of resolution of symptoms.  Alyzae Hawkey [employer] and [place] Doctor of Pharmacy Candidates, 2018

## 2016-02-11 NOTE — Progress Notes (Signed)
   Subjective:  Patient was complaining of new onset nasal congestion and sinus pressure, stating that her nose is blocked since she woke up this morning. Her cough is improved. Still having abdominal pain with coughing. She was complaining of one large semisolid bowel movement this morning.  Objective:  Vital signs in last 24 hours: Vitals:   02/10/16 1400 02/10/16 2105 02/10/16 2202 02/11/16 0427  BP: 109/79  (!) 119/57 (!) 113/53  Pulse: (!) 101  94 78  Resp: _0 Temp: 98.2 F (36.8 C)  99.1 F (37.3 C) 98.2 F (36.8 C)  TempSrc: Oral  Oral Oral  SpO2: 94% 94% 93% 95%  Weight:    268 lb 8.3 oz (121.8 kg)  Height:       Gen. Well-developed, morbidly obese lady, sitting comfortably in bed, in no acute distress. Chest. Few coarse breath sounds at bases. CVS. Regular rate and rhythm. Abdomen. Soft, obese, bowel sounds positive. Extremities. 1+ pitting edema bilaterally up to mid leg, with signs of stasis bilaterally.  Labs. CBC Latest Ref Rng & Units 02/11/2016 02/10/2016 02/09/2016  WBC 4.0 - 10.5 K/uL 6.5 9.2 9.2  Hemoglobin 12.0 - 15.0 g/dL 12.6 13.1 13.0  Hematocrit 36.0 - 46.0 % 39.7 41.4 40.8  Platelets 150 - 400 K/uL 422(H) 368 414(H)   BMP Latest Ref Rng & Units 02/11/2016 02/10/2016 02/09/2016  Glucose 65 - 99 mg/dL 102(H) 99 104(H)  BUN 6 - 20 mg/dL _1 Creatinine 0.44 - 1.00 mg/dL 0.90 1.07(H) 1.00  BUN/Creat Ratio 9 - 23 - - -  Sodium 135 - 145 mmol/L 139 137 135  Potassium 3.5 - 5.1 mmol/L 3.0(L) 3.3(L) 3.5  Chloride 101 - 111 mmol/L 97(L) 99(L) 99(L)  CO2 22 - 32 mmol/L _2 Calcium 8.9 - 10.3 mg/dL 8.3(L) 8.4(L) 8.3(L)    Assessment/Plan:  [name] a 56 y.o.femalewith a h/o of DM, h/o A Flutter, PAH, and CHFwho presents with for a 1 week h/o worsening cough and SOB.  Influenza A positive with secondary pneumonia.(LLL). Remained afebrile, without leukocytosis. Was complaining of nasal congestion today. Cough improved. -Continue  azithromycin for a total 5 days. -Ceftriaxone will be converted to Cefdinir on discharge for total of 10 days. -Chlorpheniramine for nasal congestion. -Saline nasal sprays.  Hypokalemia. Her potassium today was 3.0 She did not get her potassium 40 mEq as ordered yesterday. -Replace potassium. -Recheck BMP in the afternoon before discharge.  Pulmonary hypertension. She [facility]. -Continue her continuous infusion of treprostinil + sildenafil 550m.  Right heart failure. Remained asymptomatic. -Continue her home dose of torsemide and spironolactone.  Paroxysmal Atrial Flutter. Currently no complaints. -Continue home dose of Xarelto.  Type 2 diabetes. Well controlled according to last A1c few days ago. -SSI.  Diabetic neuropathy. -Continue home dose of Neurontin 600 mg 3 times daily.  OSA. -Continue CPAP at night.  Dispo: Can be discharged later today.  SLorella Nimrod MD 02/11/2016, 1:11 PM Pager: [phone]

## 2016-02-12 ENCOUNTER — Telehealth: Payer: Self-pay

## 2016-02-12 DIAGNOSIS — Z882 Allergy status to sulfonamides status: Secondary | ICD-10-CM

## 2016-02-12 DIAGNOSIS — Z886 Allergy status to analgesic agent status: Secondary | ICD-10-CM

## 2016-02-12 DIAGNOSIS — Z885 Allergy status to narcotic agent status: Secondary | ICD-10-CM

## 2016-02-12 DIAGNOSIS — Z888 Allergy status to other drugs, medicaments and biological substances status: Secondary | ICD-10-CM

## 2016-02-12 DIAGNOSIS — Z91040 Latex allergy status: Secondary | ICD-10-CM

## 2016-02-12 LAB — BASIC METABOLIC PANEL
Anion gap: 10 (ref 5–15)
BUN: 12 mg/dL (ref 6–20)
CALCIUM: 8.9 mg/dL (ref 8.9–10.3)
CHLORIDE: 98 mmol/L — AB (ref 101–111)
CO2: 32 mmol/L (ref 22–32)
CREATININE: 0.87 mg/dL (ref 0.44–1.00)
GFR calc Af Amer: >60 mL/min (ref >60)
GFR calc non Af Amer: >60 mL/min (ref >60)
Glucose, Bld: 98 mg/dL (ref 65–99)
Potassium: 3.7 mmol/L (ref 3.5–5.1)
SODIUM: 140 mmol/L (ref 135–145)

## 2016-02-12 LAB — GLUCOSE, CAPILLARY
Glucose-Capillary: 97 mg/dL (ref 65–99)
Glucose-Capillary: 124 mg/dL — ABNORMAL HIGH (ref 65–99)

## 2016-02-12 MED ORDER — SALINE SPRAY 0.65 % NA SOLN
2.0000 | NASAL | 0 refills | Status: DC | PRN
Start: 2016-02-12 — End: 2019-05-24

## 2016-02-12 MED ORDER — DM-GUAIFENESIN ER 30-600 MG PO TB12: 1.0000 | ORAL_TABLET | Freq: Two times a day (BID) | ORAL | 0 refills | Status: DC

## 2016-02-12 MED ORDER — MUPIROCIN 2 % EX OINT: 1.0000 "application " | TOPICAL_OINTMENT | Freq: Two times a day (BID) | CUTANEOUS | 0 refills | Status: DC

## 2016-02-12 MED ORDER — BENZONATATE 100 MG PO CAPS: 100.0000 mg | ORAL_CAPSULE | Freq: Two times a day (BID) | ORAL | 0 refills | Status: DC

## 2016-02-12 MED ORDER — AZITHROMYCIN 500 MG PO TABS: 500.0000 mg | ORAL_TABLET | ORAL | 0 refills | Status: AC

## 2016-02-12 MED ORDER — CEFDINIR 300 MG PO CAPS: 300.0000 mg | ORAL_CAPSULE | Freq: Two times a day (BID) | ORAL | 0 refills | Status: AC

## 2016-02-12 MED ORDER — AZITHROMYCIN 500 MG PO TABS: 500.0000 mg | ORAL_TABLET | ORAL | 0 refills | Status: DC

## 2016-02-12 MED ORDER — OSELTAMIVIR PHOSPHATE 75 MG PO CAPS: 75.0000 mg | ORAL_CAPSULE | Freq: Two times a day (BID) | ORAL | 0 refills | Status: DC

## 2016-02-12 NOTE — Discharge Instructions (Signed)
Please come to internal medicine center for your follow-up appointment on February 15 at 9:45 AM.  ------------------------------------------------------------------------------------------------  Information on my medicine - XARELTO (Rivaroxaban)  This medication education was reviewed with me or my healthcare representative as part of my discharge preparation.    Why was Xarelto prescribed for you? Xarelto was prescribed for you to reduce the risk of a blood clot forming that can cause a stroke if you have a medical condition called atrial fibrillation (a type of irregular heartbeat).  What do you need to know about xarelto ? Take your Xarelto ONCE DAILY at the same time every day with your evening meal. If you have difficulty swallowing the tablet whole, you may crush it and mix in applesauce just prior to taking your dose.  Take Xarelto exactly as prescribed by your doctor and DO NOT stop taking Xarelto without talking to the doctor who prescribed the medication.  Stopping without other stroke prevention medication to take the place of Xarelto may increase your risk of developing a clot that causes a stroke.  Refill your prescription before you run out.  After discharge, you should have regular check-up appointments with your healthcare provider that is prescribing your Xarelto.  In the future your dose may need to be changed if your kidney function or weight changes by a significant amount.  What do you do if you miss a dose? If you are taking Xarelto ONCE DAILY and you miss a dose, take it as soon as you remember on the same day then continue your regularly scheduled once daily regimen the next day. Do not take two doses of Xarelto at the same time or on the same day.   Important Safety Information A possible side effect of Xarelto is bleeding. You should call your healthcare provider right away if you experience any of the following: ? Bleeding from an injury or your nose  that does not stop. ? Unusual colored urine (red or dark brown) or unusual colored stools (red or black). ? Unusual bruising for unknown reasons. ? A serious fall or if you hit your head (even if there is no bleeding).  Some medicines may interact with Xarelto and might increase your risk of bleeding while on Xarelto. To help avoid this, consult your healthcare provider or pharmacist prior to using any new prescription or non-prescription medications, including herbals, vitamins, non-steroidal anti-inflammatory drugs (NSAIDs) and supplements.  This website has more information on Xarelto: [email or web address].[email or web address]

## 2016-02-12 NOTE — Progress Notes (Signed)
   Subjective: Patient was feeling better this morning. Still having cough but her nausea has improved.  Objective:  Vital signs in last 24 hours: Vitals:   02/11/16 1336 02/11/16 2009 02/11/16 2106 02/12/16 0506  BP: 120/73 118/69  (!) 142/77  Pulse: 93 87  79  Resp: _0 Temp: 98.6 F (37 C) 97.9 F (36.6 C)  98 F (36.7 C)  TempSrc: Oral Oral  Oral  SpO2: 98% 96% 96% 95%  Weight:    268 lb 15.4 oz (122 kg)  Height:       Gen. Morbidly obese lady, in no acute distress. Chest. Bibasilar coarse breath sounds with few crackles. CVS. Regular rate and rhythm. Abdomen.Soft, obese, bowel sounds positive. Extremities.1+ pitting edema bilaterally up to mid leg,with signs of stasis bilaterally.  Labs. BMP Latest Ref Rng & Units 02/12/2016 02/11/2016 02/11/2016  Glucose 65 - 99 mg/dL 98 110(H) 102(H)  BUN 6 - 20 mg/dL _1 Creatinine 0.44 - 1.00 mg/dL 0.87 0.71 0.90  BUN/Creat Ratio 9 - 23 - - -  Sodium 135 - 145 mmol/L 140 138 139  Potassium 3.5 - 5.1 mmol/L 3.7 3.0(L) 3.0(L)  Chloride 101 - 111 mmol/L 98(L) 99(L) 97(L)  CO2 22 - 32 mmol/L 32 28 30  Calcium 8.9 - 10.3 mg/dL 8.9 8.5(L) 8.3(L)    Assessment/Plan:  Ms.Ottilie A Mooreis a 56 y.o.femalewith a h/o of DM, h/o A Flutter, PAH, and CHFwho presents with for a 1 week h/o worsening cough and SOB.  Influenza A positive with secondary pneumonia.(LLL). Remained afebrile.  -She will continue to complete her antibiotics after discharge. -She is being discharged on azithromycin for 2 more days and Cefdinir for 7 days.  Hypokalemia. Resolved today. -She will continue her home dose of potassium.  Pulmonary hypertension.She followed [facility]. -Continue her continuous infusion of treprostinil + sildenafil 78m.  Right heart failure. Remained asymptomatic. -Continue her home dose of torsemide and spironolactone.  Paroxysmal Atrial Flutter.Currently no complaints. -Continue home dose of  Xarelto.  Type 2 diabetes.Well controlled according to last A1c few days ago. -SSI.  Diabetic neuropathy. -Continue home dose of Neurontin 600 mg 3 times daily.  OSA. -Continue CPAP at night.  Dispo: Being discharged today.  This record has been created using [employer] analyst Errors have been sought and corrected,but may not always be located. Such creation errors do not reflect on the standard of care.  SLorella Nimrod MD 02/12/2016, 11:36 AM Pager: [phone]

## 2016-02-12 NOTE — Telephone Encounter (Signed)
Hospital TOC per Dr. Reesa Chew, discharge 02/12/2016 appt 02/19/2016.

## 2016-02-12 NOTE — Discharge Summary (Signed)
Name: [name] MRN: [id number] DOB: 1960-03-30 56 y.o. PCP: Faith Liberty, MD  Date of Admission: 02/09/2016 11:23 PM Date of Discharge: 02/12/2016 Attending Physician: Faith Linsey, MD  Discharge Diagnosis: 1. Influenza a positive secondary pneumonia.  Principal Problem:   Pneumonia of left lower lobe due to infectious organism [facility]) Active Problems:   Obstructive sleep apnea   Obesity   Diabetes ([place])   Atrial flutter ([place])   Peripheral neuropathy ([place])   Influenza A   Discharge Medications: Allergies as of 02/12/2016      Reactions   Aspirin    REACTION: airway swelling   Codeine    REACTION: tingling in lips and hard breathing - had reaction at dentist - states "I can't take certain kinds of codeine" - happened maybe 10 yr ago   Lisinopril    Cough   Sulfonamide Derivatives    REACTION: airway swelling   Latex Rash      Medication List    TAKE these medications   acetaminophen 500 MG tablet Commonly known as:  TYLENOL Take 1 tablet (500 mg total) by mouth every 6 (six) hours as needed. What changed:  how much to take  reasons to take this   azithromycin 500 MG tablet Commonly known as:  ZITHROMAX Take 1 tablet (500 mg total) by mouth daily. Start taking on:  02/13/2016   BENADRYL 25 mg capsule Generic drug:  diphenhydrAMINE Take 25-75 mg by mouth 2 (two) times daily as needed for itching.   benzonatate 100 MG capsule Commonly known as:  TESSALON Take 1 capsule (100 mg total) by mouth 2 (two) times daily.   cefdinir 300 MG capsule Commonly known as:  OMNICEF Take 1 capsule (300 mg total) by mouth 2 (two) times daily.   cetirizine 10 MG tablet Commonly known as:  ZYRTEC TAKE ONE TABLET BY MOUTH ONCE DAILY   cyanocobalamin 500 MCG tablet Take 500 mcg by mouth daily.   dextromethorphan-guaiFENesin 30-600 MG 12hr tablet Commonly known as:  MUCINEX DM Take 1 tablet by mouth 2 (two) times daily.   diclofenac sodium 1 % Gel Commonly  known as:  VOLTAREN Apply 4 g topically 4 (four) times daily. What changed:  when to take this  reasons to take this   ferrous sulfate 325 (65 FE) MG tablet Take 1 tablet (325 mg total) by mouth daily with breakfast.   gabapentin 300 MG capsule Commonly known as:  NEURONTIN TAKE TWO CAPSULES BY MOUTH THREE TIMES DAILY   glucose blood test strip Commonly known as:  ACCU-CHEK AVIVA PLUS Use  To check blood sugar up to 1 time a day   ibuprofen 200 MG tablet Commonly known as:  ADVIL,MOTRIN Take 600 mg by mouth every 6 (six) hours as needed for moderate pain.   metFORMIN 1000 MG tablet Commonly known as:  GLUCOPHAGE Please take 1/2 tablet daily with breakfast What changed:  how much to take  how to take this  when to take this  additional instructions   mupirocin ointment 2 % Commonly known as:  BACTROBAN Place 1 application into the nose 2 (two) times daily.   oseltamivir 75 MG capsule Commonly known as:  TAMIFLU Take 1 capsule (75 mg total) by mouth 2 (two) times daily.   pantoprazole 20 MG tablet Commonly known as:  PROTONIX Take 1 tablet (20 mg total) by mouth 2 (two) times daily.   potassium chloride SA 20 MEQ tablet Commonly known as:  KLOR-CON M20 TAKE TWO TABLETS  BY MOUTH THREE TIMES DAILY, TAKE EXTRA TABLET WHEN TAKING AN EXTRA TORESMIDE What changed:  how much to take  how to take this  when to take this  additional instructions   pravastatin 20 MG tablet Commonly known as:  PRAVACHOL Take 1 tablet (20 mg total) by mouth daily.   rivaroxaban 20 MG Tabs tablet Commonly known as:  XARELTO Take 1 tablet (20 mg total) by mouth daily with supper.   sildenafil 20 MG tablet Commonly known as:  REVATIO Take 1 tablet (20 mg total) by mouth 3 (three) times daily.   sodium chloride 0.65 % Soln nasal spray Commonly known as:  OCEAN Place 2 sprays into both nostrils as needed for congestion.   spironolactone 25 MG tablet Commonly known as:   ALDACTONE Take 0.5 tablets (12.5 mg total) by mouth daily.   torsemide 20 MG tablet Commonly known as:  DEMADEX TAKE THREE TABLETS BY MOUTH ONCE DAILY       Disposition and follow-up:   [name] was discharged from [facility] in Good condition.  At the hospital follow up visit please address:  1.  Her cough and resolution of pneumonia.  2.  Labs / imaging needed at time of follow-up: None  3.  Pending labs/ test needing follow-up: None  Follow-up Appointments: Follow-up Information    Faith Liberty, MD. Go on 02/19/2016.   Specialty:  Internal Medicine Why:  At 9:45 AM. Contact information: [place] [address] [phone]        [place] Follow up.   Why:  HHRN resumed Contact information: [place] [address] [phone]           Hospital Course by problem list:  [name] a 56 y.o.femalewith a h/o of DM, h/o A Flutter, PAH-on continuously infused treprostinil and oral sildenafil and CHFwho presents with for a 1 week h/o worsening cough and SOB.  Influenza A positive with secondary pneumonia.(LLL).Found to have a possible consolidation at left lower lobe, possibly due to secondary to pneumonia with positive influenza A she was treated with Tamiflu, ceftriaxone and azithromycin.Her symptoms improved. She was discharged home with Tamiflu ,azithromycin, and Omnicef to complete her antibiotic course for secondary pneumonia.  Hypokalemia. She was found to be hypokalemic, magnesium normal. Electrolytes were replaced. Hypokalemia resolved on discharge.She was discharged home on her home dose of potassium.  Pulmonary hypertension.She followed by pulmonology at [facility]. -We Continued her continuous infusion of treprostinil + sildenafil 67m.  Right heart failure.Remained asymptomatic. -Continued her home dose of torsemide and spironolactone.  Paroxysmal  Atrial Flutter.Currently no complaints. -Continue home dose of Xarelto.  Type 2 diabetes.Well controlled according to last A1c few days ago.  she was discharged home on her home regimen.  Diabetic neuropathy. -Continued home dose of Neurontin 600 mg 3 times daily.  OSA. -Continue CPAP at night.  Discharge Vitals:   BP (!) 142/77 (BP Location: Right Arm)   Pulse 79   Temp 98 F (36.7 C) (Oral)   Resp 17   Ht 5' 3.5" (1.613 m)   Wt 268 lb 15.4 oz (122 kg)   LMP 02/19/2011   SpO2 95%   BMI 46.90 kg/m   Gen. Morbidly obese lady, in no acute distress. Chest. Bibasilar coarse breath sounds with few crackles. CVS. Regular rate and rhythm. Abdomen.Soft, obese, bowel sounds positive. Extremities.1+ pitting edema bilaterally up to mid leg,with signs of stasis bilaterally.  Pertinent Labs,  Studies, and Procedures:  CBC Latest Ref Rng & Units 02/11/2016 02/10/2016 02/09/2016  WBC 4.0 - 10.5 K/uL 6.5 9.2 9.2  Hemoglobin 12.0 - 15.0 g/dL 12.6 13.1 13.0  Hematocrit 36.0 - 46.0 % 39.7 41.4 40.8  Platelets 150 - 400 K/uL 422(H) 368 414(H)   BMP Latest Ref Rng & Units 02/12/2016 02/11/2016 02/11/2016  Glucose 65 - 99 mg/dL 98 110(H) 102(H)  BUN 6 - 20 mg/dL _0 Creatinine 0.44 - 1.00 mg/dL 0.87 0.71 0.90  BUN/Creat Ratio 9 - 23 - - -  Sodium 135 - 145 mmol/L 140 138 139  Potassium 3.5 - 5.1 mmol/L 3.7 3.0(L) 3.0(L)  Chloride 101 - 111 mmol/L 98(L) 99(L) 97(L)  CO2 22 - 32 mmol/L 32 28 30  Calcium 8.9 - 10.3 mg/dL 8.9 8.5(L) 8.3(L)   Liver Function Tests:   02/09/16 2301  AST 22  ALT 18  ALKPHOS 75  BILITOT 0.5  PROT 6.9  ALBUMIN 3.7    Magnesium. 2.0  Urinalysis:  Clear, straw-colored, specific gravity 1.008, pH 5.0, hemoglobin moderate, nitrite negative, leukocytes negative, 0-5 red blood cells per high-power field, 0-5 white blood cells per high-power field.  Misc. Labs:  Lactic acid 1.89 -> 0.93 Blood cultures 2 negative to date Strep pneumonia urinary  antigen negative HIV antibody nonreactive Influenza A positive Influenza B negative MRSA PCR screening positive  Imaging results:  Dg Chest 2 View  Result Date: 02/09/2016 CLINICAL DATA:  Cough and vomiting blood EXAM: CHEST  2 VIEW COMPARISON:  06/25/2013 FINDINGS: A right-sided central venous catheter tip overlies the SVC. Increased opacity at the left lung base may reflect an infiltrate. No large pleural effusion. Mild cardiomegaly with central vascular congestion. No pneumothorax. Degenerative changes of the spine. IMPRESSION: 1. Increased opacity at the left lung base could reflect an infiltrate 2. Cardiomegaly with mild central vascular congestion Electronically Signed   By: Donavan Foil M.D.   On: 02/09/2016 23:39   I personally reviewed the chest x-ray and there is some haziness of the left hemidiaphragm suggesting early silhouette sign and possible consolidation at the left base. This is not well seen on the lateral film. She does have by basilar interstitial changes. Her pulmonary artery on the right is prominent. On the lateral view her pulmonary arteries that are moving posteriorly are also slightly prominent.  Other results:  EKG: Normal sinus rhythm at 94 bpm, right axis deviation, QTC 502, no significant Q waves, no LVH by voltage, poor R wave progression, no ST or T-wave changes. This ECG is unchanged from the previous ECG on 01/23/2016 except for the QTC being slightly less then before.   Discharge Instructions: Discharge Instructions    Diet - low sodium heart healthy    Complete by:  As directed    Diet - low sodium heart healthy    Complete by:  As directed    Discharge instructions    Complete by:  As directed    IT was pleasure taking care of you. Please complete your antibiotic course as directed. Please make an follow-up appointment with your PCP within one week.   Increase activity slowly    Complete by:  As directed    Increase activity slowly     Complete by:  As directed      This record has been created using Systems analyst. Errors have been sought and corrected,but may not always be located. Such creation errors do not reflect on the standard of  care.  Signed: [name], MD 02/12/2016, 3:27 PM   Pager: [phone]

## 2016-02-12 NOTE — Care Management Note (Addendum)
Case Management Note Marvetta Gibbons RN, BSN Unit 2W-Case Manager [phone]  Patient Details  Name: [name] MRN: [id number] Date of Birth: Dec 18, 1960  Subjective/Objective:  Pt presented with +flu                   Action/Plan: PTA pt lived at home with son, per pt she has home 66 with [facility] (baseline 4L), she also has a cane and RW at home, plan to return home, CM to follow.   Expected Discharge Date:  02/12/16               Expected Discharge Plan:  [place]  In-House Referral:     Discharge planning Services  CM Consult  Post Acute Care Choice:  Home Health, Resumption of Svcs/PTA Provider Choice offered to:  Patient  DME Arranged:    DME Agency:     HH Arranged:  RN [place] Agency:  [facility]  Status of Service:  Completed  If discussed at [place] of Stay Meetings, dates discussed:    Discharge Disposition: home with home health   Additional Comments:  02/12/16- 1400- Clements Toro rN, CM- pt for d/c home  Today- [facility] order has been placed to resume services with [facility]- have notified Santiago Glad with [facility] for resumption of care.   Dawayne Patricia, RN 02/12/2016, 2:12 PM

## 2016-02-15 LAB — CULTURE, BLOOD (ROUTINE X 2)
CULTURE: NO GROWTH
Culture: NO GROWTH

## 2016-02-18 NOTE — Telephone Encounter (Signed)
Called patient's phone but no answer, kept ringing then cut off. [facility] appt 02/19/16

## 2016-02-19 ENCOUNTER — Ambulatory Visit: Payer: Medicaid Other | Admitting: Dietician

## 2016-02-19 ENCOUNTER — Encounter: Payer: Self-pay | Admitting: Internal Medicine

## 2016-02-19 ENCOUNTER — Ambulatory Visit (INDEPENDENT_AMBULATORY_CARE_PROVIDER_SITE_OTHER): Payer: Medicaid Other | Admitting: Internal Medicine

## 2016-02-19 VITALS — BP 115/63 | HR 85 | Temp 98.1°F | Ht 63.0 in | Wt 269.4 lb

## 2016-02-19 DIAGNOSIS — J159 Unspecified bacterial pneumonia: Secondary | ICD-10-CM | POA: Diagnosis not present

## 2016-02-19 DIAGNOSIS — Z9981 Dependence on supplemental oxygen: Secondary | ICD-10-CM | POA: Diagnosis not present

## 2016-02-19 DIAGNOSIS — Z5189 Encounter for other specified aftercare: Secondary | ICD-10-CM

## 2016-02-19 DIAGNOSIS — J1108 Influenza due to unidentified influenza virus with specified pneumonia: Secondary | ICD-10-CM

## 2016-02-19 DIAGNOSIS — J189 Pneumonia, unspecified organism: Secondary | ICD-10-CM

## 2016-02-19 DIAGNOSIS — J181 Lobar pneumonia, unspecified organism: Principal | ICD-10-CM

## 2016-02-19 NOTE — Patient Instructions (Signed)
I'm glad you are feeling better since discharge! It is important that you continue taking your antibiotics as directed for your pneumonia. You are on 2 different antibiotics, Cefdinir and Azithromycin.  Please return to clinic in 1 month to make sure you are still improving.

## 2016-02-19 NOTE — Progress Notes (Signed)
Medicine attending: Medical history, presenting problems, physical findings, and medications, reviewed with resident physician Dr Romelle Starcher Molt on the day of the patient visit and I concur with her evaluation and management plan.

## 2016-02-19 NOTE — Progress Notes (Signed)
CC: hospital follow up of influenza and pneumonia  HPI:  [name] is a 56 y.o. F who presents tot he acute care clinic for follow-up of recent hospitalization. [name] was admitted 2/5-2/8 with Influenza A with a secondary bacterial pneumonia. During hospitalization she was treated with IV Azithromycin, Ceftriaxone and Po Tamiflu with pretty quick improvement of her symptoms. She was discharged home with PO Azithromycin and Cefdinir. Today she denies any fevers, chills, cough or worsening shortness of breath. She is on her home O2 (3-4L) and doing well. No complaints.   Past Medical History:  Diagnosis Date  . Anemia, iron deficiency    secondary to menhorrhagia, on oral iron, also b12 def, getting monthly b12 shots  . CHF (congestive heart failure) ([place])   . Chronic cough    secondary to alleriges and post nasal drip  . Cognitive impairment   . COPD (chronic obstructive pulmonary disease) ([place])   . Cor pulmonale (H[facility])    PA Peak pressure 12mHg  . Depression   . Diabetes mellitus    well controlled on metformin  . Diastolic heart failure ([place]   . GERD (gastroesophageal reflux disease)   . H/O mental retardation   . Herpes   . Hyperlipidemia   . Hypertension   . OSA (obstructive sleep apnea)    CPAP  . Pulmonary hypertension   . Renal disorder   . Restrictive lung disease    PFTs 06/2012 (FVC 54% predicted and FEV1 68% predicted w minimal bronchodilator response).  . Shortness of breath   . Venous stasis ulcer ([place]    chornic, ?followed up at wound care center, multiple courses of antibiotics in past for cellulitis, on lasix    Review of Systems: Review of Systems  Constitutional: Negative for chills, diaphoresis and fever.  Respiratory: Positive for shortness of breath (at baseline). Negative for cough, sputum production and wheezing.   Cardiovascular: Negative for chest pain and palpitations.  Gastrointestinal: Negative for abdominal pain, diarrhea, nausea  and vomiting.  Musculoskeletal: Negative for falls, joint pain and myalgias.  Neurological: Negative for dizziness and headaches.   Physical Exam: Physical Exam  Constitutional: She appears well-developed and well-nourished. No distress.  HENT:  Head: Normocephalic and atraumatic.  Cardiovascular: Normal rate, regular rhythm and normal heart sounds.   Pulmonary/Chest: Breath sounds normal. No respiratory distress. She has no wheezes. She has no rales.  Abdominal: Soft. Bowel sounds are normal. She exhibits no distension.  Skin: Skin is warm and dry. No rash noted. She is not diaphoretic.  Psychiatric: She has a normal mood and affect. Her behavior is normal.   Vitals:   02/19/16 1024  BP: 115/63  Pulse: 85  Temp: 98.1 F (36.7 C)  TempSrc: Oral  SpO2: 93%  Weight: 269 lb 6.4 oz (122.2 kg)  Height: 5' 3" (1.6 m)   Assessment & Plan:   See Encounters Tab for problem based charting.  Patient discussed with Dr. GBeryle Beams

## 2016-02-19 NOTE — Assessment & Plan Note (Signed)
Appears improved since hospital discharge. Completing her course of Cefdinir today and has already completed her course of Azithromycin. Denies any fevers, chills or worsening shortness of breath. Saturating well on home oxygen of 3L today.  -Complete course of Cefdinir -To follow up in 1 month to ensure symptom resolution

## 2016-03-04 ENCOUNTER — Encounter (HOSPITAL_COMMUNITY): Payer: Self-pay | Admitting: Emergency Medicine

## 2016-03-04 ENCOUNTER — Ambulatory Visit (HOSPITAL_COMMUNITY)
Admission: EM | Admit: 2016-03-04 | Discharge: 2016-03-04 | Disposition: A | Discharge status: Home / Self Care | Payer: Medicaid Other | Attending: Internal Medicine | Admitting: Internal Medicine

## 2016-03-04 DIAGNOSIS — N898 Other specified noninflammatory disorders of vagina: Secondary | ICD-10-CM | POA: Diagnosis present

## 2016-03-04 DIAGNOSIS — B3749 Other urogenital candidiasis: Secondary | ICD-10-CM | POA: Diagnosis not present

## 2016-03-04 DIAGNOSIS — B373 Candidiasis of vulva and vagina: Secondary | ICD-10-CM | POA: Diagnosis not present

## 2016-03-04 DIAGNOSIS — Z7984 Long term (current) use of oral hypoglycemic drugs: Secondary | ICD-10-CM | POA: Diagnosis not present

## 2016-03-04 DIAGNOSIS — Z882 Allergy status to sulfonamides status: Secondary | ICD-10-CM | POA: Insufficient documentation

## 2016-03-04 DIAGNOSIS — A609 Anogenital herpesviral infection, unspecified: Secondary | ICD-10-CM | POA: Diagnosis not present

## 2016-03-04 DIAGNOSIS — Z7901 Long term (current) use of anticoagulants: Secondary | ICD-10-CM | POA: Diagnosis not present

## 2016-03-04 DIAGNOSIS — Z79899 Other long term (current) drug therapy: Secondary | ICD-10-CM | POA: Insufficient documentation

## 2016-03-04 DIAGNOSIS — B3731 Acute candidiasis of vulva and vagina: Secondary | ICD-10-CM

## 2016-03-04 DIAGNOSIS — B009 Herpesviral infection, unspecified: Secondary | ICD-10-CM | POA: Diagnosis not present

## 2016-03-04 LAB — POCT URINALYSIS DIP (DEVICE)
BILIRUBIN URINE: NEGATIVE
Glucose, UA: NEGATIVE mg/dL
Ketones, ur: NEGATIVE mg/dL
Nitrite: NEGATIVE
Protein, ur: NEGATIVE mg/dL
SPECIFIC GRAVITY, URINE: 1.02 (ref 1.005–1.030)
Urobilinogen, UA: 0.2 mg/dL (ref 0.0–1.0)
pH: 6 (ref 5.0–8.0)

## 2016-03-04 MED ORDER — FLUCONAZOLE 200 MG PO TABS: ORAL_TABLET | ORAL | 0 refills | Status: DC

## 2016-03-04 MED ORDER — NYSTATIN 100000 UNIT/GM EX POWD: Freq: Four times a day (QID) | CUTANEOUS | 0 refills | Status: DC

## 2016-03-04 MED ORDER — VALACYCLOVIR HCL 1 G PO TABS: 1000.0000 mg | ORAL_TABLET | Freq: Two times a day (BID) | ORAL | 0 refills | Status: AC

## 2016-03-04 NOTE — ED Triage Notes (Signed)
Pt c/o vag itching, swelling and vag d/c onset 1 week  Also reports small lesions around vagina and it "burns" when voiding urine  A&O x4... NAD

## 2016-03-04 NOTE — Discharge Instructions (Signed)
Your yeast skin and vaginal infections are most likely related to your recent history of antibiotic use. Prescribed Diflucan, take one tablet today and wait 3 days and take the second tablet. Also prescribed nystatin powder, apply to the affected area 4 times a day as needed. For your genital herpes, prescribed valacyclovir, take one tablet twice a day. Should your symptoms persist, follow up with your primary care provider, or return to clinic as needed.

## 2016-03-04 NOTE — ED Provider Notes (Signed)
CSN: [id number]     Arrival date & time 03/04/16  1214 History   None    Chief Complaint  Patient presents with  . Vaginal Itching   (Consider location/radiation/quality/duration/timing/severity/associated sxs/prior Treatment) 56 year old, medically frail, female presents to clinic with a chief complaint of vaginal discharge, itching, and also itching and pain in the perineal area. She was recently hospitalized for the flu, was treated with Tamiflu, azithromycin, ceftriaxone, upon discharge she was transitioned from ceftriaxone to oral Omnicef. She also has prior history of genital herpes, states that she is seeing a red rash on her labia, and has burning with urination.   The history is provided by the patient.  Vaginal Itching     Past Medical History:  Diagnosis Date  . Anemia, iron deficiency    secondary to menhorrhagia, on oral iron, also b12 def, getting monthly b12 shots  . CHF (congestive heart failure) ([place])   . Chronic cough    secondary to alleriges and post nasal drip  . Cognitive impairment   . COPD (chronic obstructive pulmonary disease) ([place])   . Cor pulmonale (HCC)    PA Peak pressure 7mHg  . Depression   . Diabetes mellitus    well controlled on metformin  . Diastolic heart failure ([place]   . GERD (gastroesophageal reflux disease)   . H/O mental retardation   . Herpes   . Hyperlipidemia   . Hypertension   . OSA (obstructive sleep apnea)    CPAP  . Pulmonary hypertension   . Renal disorder   . Restrictive lung disease    PFTs 06/2012 (FVC 54% predicted and FEV1 68% predicted w minimal bronchodilator response).  . Shortness of breath   . Venous stasis ulcer ([place]    chornic, ?followed up at wound care center, multiple courses of antibiotics in past for cellulitis, on lasix   Past Surgical History:  Procedure Laterality Date  . CARDIAC CATHETERIZATION N/A 01/13/2016   Procedure: Right Heart Cath;  Surgeon: DJolaine Artist MD;  [facility];  Service: Cardiovascular;  Laterality: N/A;  . CHOLECYSTECTOMY    . LEFT AND RIGHT HEART CATHETERIZATION WITH CORONARY ANGIOGRAM N/A 02/28/2014   Procedure: LEFT AND RIGHT HEART CATHETERIZATION WITH CORONARY ANGIOGRAM;  Surgeon: DJolaine Artist MD;  [facility];  Service: Cardiovascular;  Laterality: N/A;   Family History  Problem Relation Age of Onset  . Mental illness Sister   . Mental retardation Brother    Social History  Substance Use Topics  . Smoking status: Never Smoker  . Smokeless tobacco: Never Used  . Alcohol use No   OB History    Gravida Para Term Preterm AB Living   _0 SAB TAB Ectopic Multiple Live Births                 Review of Systems  Reason unable to perform ROS: As covered in history of present illness.  All other systems reviewed and are negative.   Allergies  Aspirin; Codeine; Lisinopril; Sulfonamide derivatives; and Latex  Home Medications   Prior to Admission medications   Medication Sig Start Date End Date Taking? Authorizing Provider  metFORMIN (GLUCOPHAGE) 1000 MG tablet Please take 1/2 tablet daily with breakfast Patient taking differently: Take 500 mg by mouth daily with breakfast.  09/25/15  Yes MMinus Liberty MD  pantoprazole (PROTONIX) 20 MG tablet Take 1 tablet (20 mg total) by mouth 2 (two) times  daily. 09/25/15  Yes Minus Liberty, MD  potassium chloride SA (KLOR-CON M20) 20 MEQ tablet TAKE TWO TABLETS BY MOUTH THREE TIMES DAILY, TAKE EXTRA TABLET WHEN TAKING AN EXTRA TORESMIDE Patient taking differently: Take 40 mEq by mouth 3 (three) times daily.  05/13/15  Yes Corky Sox, MD  pravastatin (PRAVACHOL) 20 MG tablet Take 1 tablet (20 mg total) by mouth daily. 09/25/15  Yes Minus Liberty, MD  rivaroxaban (XARELTO) 20 MG TABS tablet Take 1 tablet (20 mg total) by mouth daily with supper. 01/07/16  Yes Minus Liberty, MD  sildenafil (REVATIO) 20 MG tablet Take 1 tablet (20 mg total) by mouth 3 (three)  times daily. 10/20/15  Yes Jolaine Artist, MD  spironolactone (ALDACTONE) 25 MG tablet Take 0.5 tablets (12.5 mg total) by mouth daily. 09/25/15  Yes Minus Liberty, MD  torsemide (DEMADEX) 20 MG tablet TAKE THREE TABLETS BY MOUTH ONCE DAILY 08/04/15  Yes Sid Falcon, MD  acetaminophen (TYLENOL) 500 MG tablet Take 1 tablet (500 mg total) by mouth every 6 (six) hours as needed. Patient taking differently: Take 1,000 mg by mouth every 6 (six) hours as needed for moderate pain.  08/21/15   [name], MD  benzonatate (TESSALON) 100 MG capsule Take 1 capsule (100 mg total) by mouth 2 (two) times daily. 02/12/16   [name], MD  cetirizine (ZYRTEC) 10 MG tablet TAKE ONE TABLET BY MOUTH ONCE DAILY 07/04/14   Corky Sox, MD  cyanocobalamin 500 MCG tablet Take 500 mcg by mouth daily.    Historical Provider, MD  dextromethorphan-guaiFENesin (MUCINEX DM) 30-600 MG 12hr tablet Take 1 tablet by mouth 2 (two) times daily. 02/12/16   [name], MD  diclofenac sodium (VOLTAREN) 1 % GEL Apply 4 g topically 4 (four) times daily. Patient taking differently: Apply 4 g topically 4 (four) times daily as needed (pain).  08/21/15   [name], MD  diphenhydrAMINE (BENADRYL) 25 mg capsule Take 25-75 mg by mouth 2 (two) times daily as needed for itching.     Historical Provider, MD  ferrous sulfate 325 (65 FE) MG tablet Take 1 tablet (325 mg total) by mouth daily with breakfast. 07/23/14   Corky Sox, MD  fluconazole (DIFLUCAN) 200 MG tablet Take 1 tablet now, wait 3 days and take the second 03/04/16   Barnet Glasgow, NP  gabapentin (NEURONTIN) 300 MG capsule TAKE TWO CAPSULES BY MOUTH THREE TIMES DAILY 12/11/15   Minus Liberty, MD  glucose blood (ACCU-CHEK AVIVA PLUS) test strip Use  To check blood sugar up to 1 time a day 07/25/15   [name], MD  ibuprofen (ADVIL,MOTRIN) 200 MG tablet Take 600 mg by mouth every 6 (six) hours as needed for moderate pain.    Historical Provider, MD   mupirocin ointment (BACTROBAN) 2 % Place 1 application into the nose 2 (two) times daily. 02/12/16   [name], MD  nystatin (MYCOSTATIN/NYSTOP) powder Apply topically 4 (four) times daily. 03/04/16   Barnet Glasgow, NP  sodium chloride (OCEAN) 0.65 % SOLN nasal spray Place 2 sprays into both nostrils as needed for congestion. 02/12/16   [name], MD  valACYclovir (VALTREX) 1000 MG tablet Take 1 tablet (1,000 mg total) by mouth 2 (two) times daily. 03/04/16 03/11/16  Barnet Glasgow, NP   Meds Ordered and Administered this Visit  Medications - No data to display  BP 111/74 (BP Location: Left Arm)   Pulse 75   Temp 98.4 F (36.9 C) (Oral)  Resp 24   LMP 02/19/2011   SpO2 95% Comment: 3 L of O2 No data found.   Physical Exam  Constitutional: She is oriented to person, place, and time. She appears well-developed and well-nourished. No distress.  Cardiovascular: Normal rate and regular rhythm.   Pulmonary/Chest: Effort normal.  Abdominal: Soft. Bowel sounds are normal. She exhibits no distension and no mass. There is no tenderness. There is no guarding and no CVA tenderness.  Genitourinary: Pelvic exam was performed with patient supine. There is rash and lesion (Vesicular lesions consistent with genital herpes) on the right labia. There is no tenderness on the right labia. There is rash on the left labia. There is no tenderness on the left labia. Cervix exhibits discharge. Cervix exhibits no motion tenderness and no friability. Right adnexum displays no tenderness and no fullness. Left adnexum displays no tenderness and no fullness. Vaginal discharge (White, curd-like, discharge consistent with yeast) found.  Genitourinary Comments: Area of redness, erythema, mucopurulent discharge the left perineal area consistent with candidiasis.  Neurological: She is alert and oriented to person, place, and time.  Skin: Skin is warm and dry. Capillary refill takes less than 2 seconds. She is not  diaphoretic.  Psychiatric: She has a normal mood and affect.  Nursing note and vitals reviewed.   Urgent Care Course     Procedures (including critical care time)  Labs Review Labs Reviewed  POCT URINALYSIS DIP (DEVICE) - Abnormal; Notable for the following:       Result Value   Hgb urine dipstick MODERATE (*)    Leukocytes, UA TRACE (*)    All other components within normal limits  CERVICOVAGINAL ANCILLARY ONLY    Imaging Review No results found.   MDM   1. Candidiasis of urogenital sites   2. Yeast vaginitis   3. HSV (herpes simplex virus) anogenital infection    Your yeast skin and vaginal infections are most likely related to your recent history of antibiotic use. Prescribed Diflucan, take one tablet today and wait 3 days and take the second tablet. Also prescribed nystatin powder, apply to the affected area 4 times a day as needed. For your genital herpes, prescribed valacyclovir, take one tablet twice a day. Should your symptoms persist, follow up with your primary care provider, or return to clinic as needed.      Barnet Glasgow, NP 03/04/16 1343

## 2016-03-05 LAB — CERVICOVAGINAL ANCILLARY ONLY: Wet Prep (BD Affirm): NEGATIVE

## 2016-03-15 ENCOUNTER — Other Ambulatory Visit: Payer: Self-pay | Admitting: Internal Medicine

## 2016-03-18 ENCOUNTER — Ambulatory Visit: Payer: Self-pay

## 2016-03-18 ENCOUNTER — Ambulatory Visit: Payer: Self-pay | Admitting: Dietician

## 2016-03-19 ENCOUNTER — Ambulatory Visit (INDEPENDENT_AMBULATORY_CARE_PROVIDER_SITE_OTHER): Payer: Medicaid Other | Admitting: Internal Medicine

## 2016-03-19 ENCOUNTER — Other Ambulatory Visit (HOSPITAL_COMMUNITY)
Admission: RE | Admit: 2016-03-19 | Discharge: 2016-03-19 | Disposition: A | Discharge status: Home / Self Care | Payer: Medicaid Other | Source: Ambulatory Visit | Attending: Student in an Organized Health Care Education/Training Program | Admitting: Student in an Organized Health Care Education/Training Program

## 2016-03-19 ENCOUNTER — Encounter: Payer: Self-pay | Admitting: Internal Medicine

## 2016-03-19 VITALS — BP 104/62 | HR 81 | Temp 97.8°F | Ht 63.0 in | Wt 265.2 lb

## 2016-03-19 DIAGNOSIS — N761 Subacute and chronic vaginitis: Secondary | ICD-10-CM | POA: Insufficient documentation

## 2016-03-19 DIAGNOSIS — Z8619 Personal history of other infectious and parasitic diseases: Secondary | ICD-10-CM

## 2016-03-19 DIAGNOSIS — Z8742 Personal history of other diseases of the female genital tract: Secondary | ICD-10-CM

## 2016-03-19 DIAGNOSIS — N898 Other specified noninflammatory disorders of vagina: Secondary | ICD-10-CM | POA: Diagnosis not present

## 2016-03-19 DIAGNOSIS — Z5189 Encounter for other specified aftercare: Secondary | ICD-10-CM | POA: Diagnosis present

## 2016-03-19 DIAGNOSIS — N760 Acute vaginitis: Secondary | ICD-10-CM | POA: Insufficient documentation

## 2016-03-19 DIAGNOSIS — Z780 Asymptomatic menopausal state: Secondary | ICD-10-CM

## 2016-03-19 NOTE — Progress Notes (Signed)
Internal Medicine Clinic Attending  Case discussed with Dr. Guilloud at the time of the visit.  We reviewed the resident's history and exam and pertinent patient test results.  I agree with the assessment, diagnosis, and plan of care documented in the resident's note.  

## 2016-03-19 NOTE — Patient Instructions (Addendum)
Ms. Luckow,  It was a pleasure seeing you today. I will call you with the results of your testing today. If you have any questions or concerns, call our clinic at [phone] or after hours call [phone] and ask for the internal medicine resident on call. Thank you!

## 2016-03-19 NOTE — Progress Notes (Signed)
   CC: Vaginitis   HPI:  [name] is a 56 y.o. F with past medical history outlined below here for ED follow up. She was seen in the ED two weeks ago for vaginal itching, irritation, and discharge after completing a course of antibiotics for PNA. She was diagnosed with vaginal candidiasis and genital herpes and competed a course of diflucan and valacyclovir. Wet prep was negative. For the details of today's visit please refer to the assessment and plan.  Past Medical History:  Diagnosis Date  . Anemia, iron deficiency    secondary to menhorrhagia, on oral iron, also b12 def, getting monthly b12 shots  . CHF (congestive heart failure) ([place])   . Chronic cough    secondary to alleriges and post nasal drip  . Cognitive impairment   . COPD (chronic obstructive pulmonary disease) ([place])   . Cor pulmonale (HCC)    PA Peak pressure 43mHg  . Depression   . Diabetes mellitus    well controlled on metformin  . Diastolic heart failure ([place]   . GERD (gastroesophageal reflux disease)   . H/O mental retardation   . Herpes   . Hyperlipidemia   . Hypertension   . OSA (obstructive sleep apnea)    CPAP  . Pulmonary hypertension   . Renal disorder   . Restrictive lung disease    PFTs 06/2012 (FVC 54% predicted and FEV1 68% predicted w minimal bronchodilator response).  . Shortness of breath   . Venous stasis ulcer ([place]    chornic, ?followed up at wound care center, multiple courses of antibiotics in past for cellulitis, on lasix    Review of Systems:  All pertinents listed in HPI, otherwise negative  Physical Exam:  Vitals:   03/19/16 0938  BP: 104/62  Pulse: 81  Temp: 97.8 F (36.6 C)  TempSrc: Oral  SpO2: 94%  Weight: 265 lb 3.2 oz (120.3 kg)  Height: _0  (1.6 m)    Constitutional: NAD, appears comfortable  Cardiovascular: RRR Pulmonary/Chest: CTAB, breathing comfortably on her nasal cannula  Abdominal: Soft, non tender, non distended. +BS.  GU: Exam limited due  to body habitus. External genitalia are normal. No vesicular lesions noted. Vaginal mucosa appears mildly irritated and erythematous. Cervix visualized with minimal white discharge. No bleeding.   Assessment & Plan:   See Encounters Tab for problem based charting.  Patient discussed with Dr. VEvette Doffing

## 2016-03-19 NOTE — Assessment & Plan Note (Addendum)
Patient is here today for ED follow up. She recently completed a course of diflucan and valacyclovir for vaginal candidiasis and genital herpes. She reports some improvement in her symptoms after treatment but continues to have some persistent discharge and irritation. She reports some minor bleeding where she will notice bright red blood on the toilet paper, but she is unable to tell if it is vaginal or rectal bleeding. Patient reports she is post menopausal. On exam today, her external genitalia appeared normal. Her vaginal mucosa appeared mildly irritated and erythematous. No vesicular lesions identified. Her cervix was visualized and a small amount of white discharge was noted, no obvious bleeding. Rectal exam was also performed due to complaint of bleeding, no external hemorrhoids or fissures identified. Wet prep and GG and chlamydia were sent for testing.  -- F/u wet prep, GC/Chlamydia, will treat accordingly   -- If negative, patient will need to follow up with her gynecologist  -- F/u with gynecology if vaginal bleeding continues  ADDENDUM: Called patient to update her on negative wet prep, gonorrhea, and chlamydia results. Patient reports the she is still having vaginal irritation. Advised patient to follow up with her gynecologist for further work up and management. Patient expressed understanding.

## 2016-03-22 LAB — CERVICOVAGINAL ANCILLARY ONLY
Chlamydia: NEGATIVE
NEISSERIA GONORRHEA: NEGATIVE
WET PREP (BD AFFIRM): NEGATIVE

## 2016-04-05 ENCOUNTER — Other Ambulatory Visit: Payer: Self-pay | Admitting: Internal Medicine

## 2016-04-05 DIAGNOSIS — Z1231 Encounter for screening mammogram for malignant neoplasm of breast: Secondary | ICD-10-CM

## 2016-04-07 ENCOUNTER — Other Ambulatory Visit: Payer: Self-pay | Admitting: Internal Medicine

## 2016-04-08 ENCOUNTER — Other Ambulatory Visit: Payer: Self-pay

## 2016-04-08 NOTE — Telephone Encounter (Signed)
spironolactone (ALDACTONE) 25 MG tablet, refill request @ [facility] on [address].

## 2016-04-09 NOTE — Telephone Encounter (Signed)
Script was placed on hold at pharm, they are filling and will call pt

## 2016-04-10 ENCOUNTER — Other Ambulatory Visit: Payer: Self-pay | Admitting: Internal Medicine

## 2016-04-22 ENCOUNTER — Ambulatory Visit: Payer: Medicaid Other | Admitting: Pharmacist

## 2016-04-22 DIAGNOSIS — Z5329 Procedure and treatment not carried out because of patient's decision for other reasons: Secondary | ICD-10-CM

## 2016-04-22 DIAGNOSIS — Z91199 Patient's noncompliance with other medical treatment and regimen due to unspecified reason: Secondary | ICD-10-CM

## 2016-04-22 NOTE — Progress Notes (Signed)
Pt arrived for diabetes education group at around 11:05am and left unannounced at around 11:10am. Class is supposed to run 10:15-11:15. Her meter was not able to be read because it was low battery. No weight obtained. The patient is unsure of if she is still supposed to be checking her blood sugars every day because she thought the doctor told her not to. I could not find any notation of that, so I advised her to call her doctor and ask.  Florinda Marker PharmD Candidate

## 2016-04-23 ENCOUNTER — Ambulatory Visit
Admission: RE | Admit: 2016-04-23 | Discharge: 2016-04-23 | Disposition: A | Discharge status: Home / Self Care | Payer: Medicaid Other | Source: Ambulatory Visit | Attending: Internal Medicine | Admitting: Internal Medicine

## 2016-04-23 DIAGNOSIS — Z1231 Encounter for screening mammogram for malignant neoplasm of breast: Secondary | ICD-10-CM | POA: Diagnosis not present

## 2016-04-26 NOTE — Progress Notes (Signed)
Marland Kitchen

## 2016-05-20 ENCOUNTER — Ambulatory Visit (INDEPENDENT_AMBULATORY_CARE_PROVIDER_SITE_OTHER): Payer: Medicaid Other | Admitting: Dietician

## 2016-05-20 ENCOUNTER — Ambulatory Visit: Payer: Self-pay | Admitting: Pharmacist

## 2016-05-20 ENCOUNTER — Encounter: Payer: Self-pay | Admitting: Dietician

## 2016-05-20 VITALS — Wt 264.6 lb

## 2016-05-20 DIAGNOSIS — E119 Type 2 diabetes mellitus without complications: Secondary | ICD-10-CM

## 2016-05-20 DIAGNOSIS — Z713 Dietary counseling and surveillance: Secondary | ICD-10-CM | POA: Diagnosis not present

## 2016-05-20 DIAGNOSIS — Z6841 Body Mass Index (BMI) 40.0 and over, adult: Secondary | ICD-10-CM | POA: Diagnosis not present

## 2016-05-20 DIAGNOSIS — E1142 Type 2 diabetes mellitus with diabetic polyneuropathy: Secondary | ICD-10-CM

## 2016-05-20 NOTE — Progress Notes (Signed)
Diabetes Self-Management Education  Visit Type:  Follow-up  Appt. Start Time: 945 Appt. End Time: 1030  05/20/2016  Ms. Adara Kittle, identified by name and date of birth, is a 56 y.o. female with a diagnosis of Diabetes:  Marland Kitchen  Type 2 ASSESSMENT  Weight 264 lb 9.6 oz (120 kg), last menstrual period 02/19/2011. Body mass index is 46.87 kg/m.       Diabetes Self-Management Education - 05/20/16 1100      Health Coping   How would you rate your overall health? Fair     Psychosocial Assessment   Patient Belief/Attitude about Diabetes Motivated to manage diabetes   Self-care barriers Lack of transportation;Lack of material resources   Self-management support Doctor's office;CDE visits   Patient Concerns Nutrition/Meal planning;Healthy Lifestyle;Problem Solving;Weight Control   Special Needs Simplified materials   Preferred Learning Style No preference indicated   Learning Readiness Contemplating     Complications   Last HgB A1C per patient/outside source 6 %   How often do you check your blood sugar? 0 times/day (not testing)   Have you had a dilated eye exam in the past 12 months? Yes   Are you checking your feet? Yes     Exercise   Exercise Type ADL's;Light (walking / raking leaves)   How many days per week to you exercise? 4   How many minutes per day do you exercise? 30  30   Total minutes per week of exercise 120     Patient Education   Nutrition management  Food label reading, portion sizes and measuring food.   Physical activity and exercise  Other (comment)   Personal strategies to promote health Helped patient develop diabetes management plan for (enter comment)     Individualized Goals (developed by patient)   Nutrition Follow meal plan discussed   Physical Activity Exercise 3-5 times per week;30 minutes per day     Patient Self-Evaluation of Goals - Patient rates self as meeting previously set goals (% of time)   Nutrition 25 - 50%  eats low salt, but still  eats cakes, candy,cookies   Physical Activity 50 - 75 %  walking but says it is boring   Problem Solving 25 - 50%     Outcomes   Program Status Other (comment)     Subsequent Visit   Since your last visit have you continued or begun to take your medications as prescribed? Yes   Since your last visit have you had your blood pressure checked? Yes   Is your most recent blood pressure lower, unchanged, or higher since your last visit? Unchanged   Since your last visit have you experienced any weight changes? No change   Since your last visit, are you checking your blood glucose at least once a day? No      Learning Objective:  Patient will have a greater understanding of diabetes self-management. Patient education plan is to attend individual and/or group sessions per assessed needs and concerns. My plan to support myself in continuing these changes to care for my diabetes is to attend or contact:   Plan:   Patient Instructions  xercise- hope you get to go to the [facility] class at the [place] office, CDE, Dietitian, pharmacist, church   Expected Outcomes:  Demonstrated interest in learning. Expect positive outcomes  Education material provided: Meal plan card  If problems or questions, patient to contact team via:  Phone  Future DSME appointment: - 4-6  wks

## 2016-05-20 NOTE — Progress Notes (Signed)
Medications were reviewed with the patient, including education and reconciliation. Patient was advised to contact clinic if questions arise. Patient verbalized understanding by repeat back.

## 2016-05-20 NOTE — Patient Instructions (Addendum)
Exercise- hope you get to go to the [facility] class at the [facility] are okay in small amounts- Try eating or drinking something that is both sweet and Nutritious like your [employer] Bars!   Hope to see you in June?  Call anytime,  [name] [phone]

## 2016-06-11 ENCOUNTER — Encounter: Payer: Self-pay | Admitting: *Deleted

## 2016-06-14 ENCOUNTER — Other Ambulatory Visit: Payer: Self-pay | Admitting: Internal Medicine

## 2016-06-14 DIAGNOSIS — E785 Hyperlipidemia, unspecified: Secondary | ICD-10-CM

## 2016-07-06 ENCOUNTER — Other Ambulatory Visit: Payer: Self-pay

## 2016-07-06 DIAGNOSIS — E876 Hypokalemia: Secondary | ICD-10-CM

## 2016-07-06 MED ORDER — POTASSIUM CHLORIDE CRYS ER 20 MEQ PO TBCR: EXTENDED_RELEASE_TABLET | ORAL | 3 refills | Status: DC

## 2016-07-13 ENCOUNTER — Telehealth (HOSPITAL_COMMUNITY): Payer: Self-pay | Admitting: Vascular Surgery

## 2016-07-13 NOTE — Telephone Encounter (Signed)
Left pt message to make f/u appt w/ db

## 2016-08-02 ENCOUNTER — Ambulatory Visit (INDEPENDENT_AMBULATORY_CARE_PROVIDER_SITE_OTHER): Payer: Medicaid Other | Admitting: Internal Medicine

## 2016-08-02 ENCOUNTER — Encounter: Payer: Self-pay | Admitting: Internal Medicine

## 2016-08-02 VITALS — BP 112/66 | HR 79 | Temp 97.8°F | Ht 63.0 in | Wt 264.6 lb

## 2016-08-02 DIAGNOSIS — E7800 Pure hypercholesterolemia, unspecified: Secondary | ICD-10-CM

## 2016-08-02 DIAGNOSIS — E1142 Type 2 diabetes mellitus with diabetic polyneuropathy: Secondary | ICD-10-CM

## 2016-08-02 DIAGNOSIS — J449 Chronic obstructive pulmonary disease, unspecified: Secondary | ICD-10-CM | POA: Diagnosis not present

## 2016-08-02 DIAGNOSIS — Z23 Encounter for immunization: Secondary | ICD-10-CM

## 2016-08-02 DIAGNOSIS — Z7984 Long term (current) use of oral hypoglycemic drugs: Secondary | ICD-10-CM | POA: Diagnosis not present

## 2016-08-02 DIAGNOSIS — I50810 Right heart failure, unspecified: Secondary | ICD-10-CM

## 2016-08-02 DIAGNOSIS — I110 Hypertensive heart disease with heart failure: Secondary | ICD-10-CM | POA: Diagnosis not present

## 2016-08-02 DIAGNOSIS — E785 Hyperlipidemia, unspecified: Secondary | ICD-10-CM | POA: Diagnosis not present

## 2016-08-02 DIAGNOSIS — K219 Gastro-esophageal reflux disease without esophagitis: Secondary | ICD-10-CM

## 2016-08-02 DIAGNOSIS — I272 Pulmonary hypertension, unspecified: Secondary | ICD-10-CM

## 2016-08-02 DIAGNOSIS — G4733 Obstructive sleep apnea (adult) (pediatric): Secondary | ICD-10-CM | POA: Diagnosis not present

## 2016-08-02 LAB — POCT GLYCOSYLATED HEMOGLOBIN (HGB A1C): HEMOGLOBIN A1C: 5.9

## 2016-08-02 LAB — GLUCOSE, CAPILLARY: GLUCOSE-CAPILLARY: 104 mg/dL — AB (ref 65–99)

## 2016-08-02 MED ORDER — PRAVASTATIN SODIUM 20 MG PO TABS: 20.0000 mg | ORAL_TABLET | Freq: Every day | ORAL | 2 refills | Status: DC

## 2016-08-02 MED ORDER — PANTOPRAZOLE SODIUM 20 MG PO TBEC: 20.0000 mg | DELAYED_RELEASE_TABLET | Freq: Two times a day (BID) | ORAL | 1 refills | Status: DC

## 2016-08-02 MED ORDER — SPIRONOLACTONE 25 MG PO TABS: 12.5000 mg | ORAL_TABLET | Freq: Every day | ORAL | 1 refills | Status: DC

## 2016-08-02 NOTE — Patient Instructions (Signed)
Please go tot he lab for your pneumonia shot, I have refilled your pravastatin, Pantoprazole, and spironolactone.  Keep up the good work on your diet your A1C was 5.9 today.

## 2016-08-02 NOTE — Assessment & Plan Note (Signed)
Pt states symptoms have been stable since being placed on ppi therapy in 2007.    -no new complaints -continue pantoprazole 54m BID

## 2016-08-02 NOTE — Assessment & Plan Note (Addendum)
Triglycerides <150 mg/dL 119   HDL >=46 mg/dL 45    Total CHOL/HDL Ratio Ratio 3.5   VLDL 0 - 40 mg/dL 24   LDL Cholesterol 0 - 99 mg/dL 90    Last lipid panel above July 2016, pt reports no problems with pravachol, I reordered for her today.  Pt has been making good changes to her diet to reduce her lipid intake and lower her cholesterol and body weight. -Will repeat lipid panel today

## 2016-08-02 NOTE — Assessment & Plan Note (Addendum)
Followed by [facility] HTN.  Stable dyspnea on exertion, not increased since last visit.  Pt on Remodulin, sildenafil.  She often runs out of her Oxygen at home and when she travels.  She is supposed to be on 3L.  She was out during this visit but was saturating at 92% at rest today.  -discussed the importance of staying on her oxygen at home and when she travels -Pt is using her cpap as prescribed -continue sildenafil, treprostinil -FU with [facility] next appt september

## 2016-08-02 NOTE — Assessment & Plan Note (Signed)
Pt tolerating metformin 537m qday well , her last A1C 02 August 2017 and it was 5.9.  Pt reports trying to cut back on sweets and drinks with sugar.  Pt hasn't checked her blood sugar at home for a while due to it being well controlled the last time she checked it and that last time she was told she only needed to check it once a week.  -Pt doing well on metformin 5042mqday will continue -Pt has eye exam scheduled for Oct 04, 2016 had to reschedule from March -Pt receiving diabetic foot exam today.

## 2016-08-02 NOTE — Assessment & Plan Note (Addendum)
Last echo: November 2016 - Left ventricle: The cavity size was normal. Wall thickness was   normal. Systolic function was normal. The estimated ejection   fraction was in the range of 55% to 60%. - Left atrium: The atrium was mildly dilated. - Right ventricle: The cavity size was moderately dilated. Systolic   function was moderately reduced. - Right atrium: The atrium was mildly dilated. - Pulmonary arteries: PA peak pressure: 78 mm Hg (S). - Pericardium, extracardiac: A trivial pericardial effusion was   identified.  -Pt not reporting any new symptoms of shortness of breath and is not clinically overloaded on exam -She is doing well on Spironolactone, torsemide  -Will continue home medications pt is followed by Cardiology for her heart failure.

## 2016-08-02 NOTE — Progress Notes (Signed)
   CC: diabetes management, OSA, PHTN, medication refills  HPI:   [name] is a 56 y.o. who is here for management of her diabetes, OSA and Pulmonary Hypertension.  She reports feeling well overall since her admission for CAP in February of this year.  She does report not wearing her home oxygen like she should and has episodes of feeling like she needs it, she places it on and feels much better, she is on 3L at home.   Please see A&P for status of the patient's chronic medical conditions  Past Medical History:  Diagnosis Date  . Anemia, iron deficiency    secondary to menhorrhagia, on oral iron, also b12 def, getting monthly b12 shots  . CHF (congestive heart failure) ([place])   . Chronic cough    secondary to alleriges and post nasal drip  . Cognitive impairment   . COPD (chronic obstructive pulmonary disease) ([place])   . Cor pulmonale (HCC)    PA Peak pressure 66mHg  . Depression   . Diabetes mellitus    well controlled on metformin  . Diastolic heart failure ([place]   . GERD (gastroesophageal reflux disease)   . H/O mental retardation   . Herpes   . Hyperlipidemia   . Hypertension   . OSA (obstructive sleep apnea)    CPAP  . Pulmonary hypertension ([place]   . Renal disorder   . Restrictive lung disease    PFTs 06/2012 (FVC 54% predicted and FEV1 68% predicted w minimal bronchodilator response).  . Shortness of breath   . Venous stasis ulcer ([place]    chornic, ?followed up at wound care center, multiple courses of antibiotics in past for cellulitis, on lasix   Review of Systems:   Review of Systems  Constitutional: Negative for chills, fever and weight loss.  HENT: Negative for ear pain, hearing loss and tinnitus.   Respiratory: Negative for cough and hemoptysis.   Cardiovascular: Negative for chest pain, palpitations and orthopnea.  Gastrointestinal: Negative for abdominal pain, heartburn, nausea and vomiting.  Genitourinary: Negative for dysuria and urgency.    Musculoskeletal: Negative for myalgias and neck pain.  Skin: Negative for itching and rash.  Neurological: Negative for dizziness, tingling and headaches.  Psychiatric/Behavioral: Negative for depression and suicidal ideas.    Physical Exam:  Vitals:   08/02/16 1347  BP: 112/66  Pulse: 79  Temp: 97.8 F (36.6 C)  TempSrc: Oral  Weight: 264 lb 9.6 oz (120 kg)  Height: _0  (1.6 m)   Physical Exam  Constitutional: She is oriented to person, place, and time. She appears well-developed and well-nourished.  HENT:  Head: Normocephalic and atraumatic.  Eyes: Right eye exhibits no discharge. Left eye exhibits no discharge.  Neck: No tracheal deviation present. No thyromegaly present.  Cardiovascular: Normal rate.   No murmur heard. Heart sounds distant, difficult to auscultate  Pulmonary/Chest: Effort normal. She has wheezes (mild diffuse expiratory wheezes).  Abdominal: Soft. Bowel sounds are normal. She exhibits no distension. There is no tenderness.  Musculoskeletal: She exhibits edema (1+ LE edema ).  Neurological: She is alert and oriented to person, place, and time. No cranial nerve deficit.  Skin: Skin is warm and dry.  Psychiatric: She has a normal mood and affect. Her behavior is normal.    Assessment & Plan:   See Encounters Tab for problem based charting.  Patient seen with Dr. VEvette Doffing

## 2016-08-03 NOTE — Progress Notes (Signed)
Internal Medicine Clinic Attending  I saw and evaluated the patient.  I personally confirmed the key portions of the history and exam documented by Dr. Shan Levans and I reviewed pertinent patient test results.  The assessment, diagnosis, and plan were formulated together and I agree with the documentation in the resident's note.

## 2016-08-05 DIAGNOSIS — G8929 Other chronic pain: Secondary | ICD-10-CM | POA: Diagnosis not present

## 2016-08-05 DIAGNOSIS — M25562 Pain in left knee: Secondary | ICD-10-CM | POA: Diagnosis not present

## 2016-08-19 ENCOUNTER — Ambulatory Visit (INDEPENDENT_AMBULATORY_CARE_PROVIDER_SITE_OTHER): Payer: Medicaid Other | Admitting: Dietician

## 2016-08-19 DIAGNOSIS — Z6841 Body Mass Index (BMI) 40.0 and over, adult: Secondary | ICD-10-CM | POA: Diagnosis not present

## 2016-08-19 DIAGNOSIS — Z713 Dietary counseling and surveillance: Secondary | ICD-10-CM | POA: Diagnosis not present

## 2016-08-19 DIAGNOSIS — E1142 Type 2 diabetes mellitus with diabetic polyneuropathy: Secondary | ICD-10-CM

## 2016-08-19 DIAGNOSIS — E119 Type 2 diabetes mellitus without complications: Secondary | ICD-10-CM

## 2016-08-19 NOTE — Progress Notes (Signed)
Diabetes Self-Management Education  Visit Type:  Follow-up  Appt. Start Time: 945 Appt. End Time: 1030  08/19/2016  [name], identified by name and date of birth, is a 56 y.o. female with a diagnosis of Diabetes:  Marland Kitchen  Type 2 ASSESSMENT Ms. Hammerstrom attended group diabetes and weight management medical nutrition therapy class today.  We had a food demo, discussed success, challenges, goals, Monitoring your blood sugars using the diabetes conversation map.  Last menstrual period 02/19/2011.  weight -265.0# BMI.56- stable       Diabetes Self-Management Education - 08/19/16 1600      Health Coping   How would you rate your overall health? Good     Psychosocial Assessment   Patient Belief/Attitude about Diabetes Motivated to manage diabetes   Self-care barriers Lack of transportation;Unsteady gait/risk for falls;Lack of material resources   Self-management support Family;CDE visits   Patient Concerns Nutrition/Meal planning;Weight Control   Special Needs Simplified materials   Preferred Learning Style Hands on;No preference indicated   Learning Readiness Ready     Complications   Last HgB A1C per patient/outside source 6 %   How often do you check your blood sugar? Not recommended by provider   Have you had a dilated eye exam in the past 12 months? Yes   Have you had a dental exam in the past 12 months? Yes   Are you checking your feet? Yes   How many days per week are you checking your feet? 7     Exercise   Exercise Type ADL's;Light (walking / raking leaves)     Subsequent Visit   Since your last visit have you continued or begun to take your medications as prescribed? Yes   Since your last visit have you had your blood pressure checked? Yes   Is your most recent blood pressure lower, unchanged, or higher since your last visit? Lower   Since your last visit have you experienced any weight changes? No change   Since your last visit, are you checking your blood glucose  at least once a day? No     Learning Objective:  Patient will have a greater understanding of diabetes self-management. Patient education plan is to attend individual and/or group sessions per assessed needs and concerns. My plan to support myself in continuing these changes to care for my diabetes is to attend or contact:    Group diabetes visits  Plan:   Patient Instructions  [name],  It was great having you in class today! It is obvious you are making slow and steady progress! Keep doing what you know ir right for you!   Walk and move as much as you can every day   How can I prepare the foods I buy in the healthiest way? Practice these tips for cooking foods in the healthiest way to reduce excess fat and calorie intake:  Steam, saute, grill, or bake foods instead of frying them.  Make sure half your plate is filled with fruits or vegetables. Choose from fresh, frozen, or canned fruits and vegetables. If eating canned, remember to rinse them before eating. This will remove any excess salt added for packaging.  Trim all fat from meat before cooking. Remove the skin from chicken or [place].  Spoon off fat from meat dishes once they have been chilled in the refrigerator and the fat has hardened on the top.  Use skim milk, low-fat milk, or evaporated skim milk when making cream sauces, soups, or  puddings.  Substitute low-fat yogurt, sour cream, or cottage cheese for sour cream and mayonnaise in dips and dressings.  Try lemon juice, herbs, or spices to season food instead of salt, butter, or margarine. . Meal planning   Eat a balanced diet that includes: ? 5 or more servings of fruits and vegetables each day. At each meal, try to fill half of your plate with fruits and vegetables. ? Up to 6-8 servings of whole grains each day. ? Less than 6 oz of lean meat, poultry, or fish each day. A 3-oz serving of meat is about the same size as a deck of cards. One egg equals 1 oz. ? 2  servings of low-fat dairy each day. ? A serving of nuts, seeds, or beans 5 times each week. ? Heart-healthy fats. Healthy fats called Omega-3 fatty acids are found in foods such as flaxseeds and coldwater fish, like sardines, salmon, and mackerel.    Try to eat at least 2 vegetarian meals each week.  Eat more home-cooked food and less restaurant, buffet, and fast food.  When eating at a restaurant, ask that your food be prepared with less salt or no salt, if possible.  See you September 20th at 10:15 AM.   Expected Outcomes:     Education material provided: Meal plan card  If problems or questions, patient to contact team via:  Phone  Future DSME appointment: -  September 23, 2016 Plyler, [name], [place] 08/19/2016 4:46 PM.

## 2016-08-19 NOTE — Patient Instructions (Signed)
Ms. Encarnacion,  It was great having you in class today! It is obvious you are making slow and steady progress! Keep doing what you know ir right for you!   Walk and move as much as you can every day   How can I prepare the foods I buy in the healthiest way? Practice these tips for cooking foods in the healthiest way to reduce excess fat and calorie intake:  Steam, saute, grill, or bake foods instead of frying them.  Make sure half your plate is filled with fruits or vegetables. Choose from fresh, frozen, or canned fruits and vegetables. If eating canned, remember to rinse them before eating. This will remove any excess salt added for packaging.  Trim all fat from meat before cooking. Remove the skin from chicken or [place].  Spoon off fat from meat dishes once they have been chilled in the refrigerator and the fat has hardened on the top.  Use skim milk, low-fat milk, or evaporated skim milk when making cream sauces, soups, or puddings.  Substitute low-fat yogurt, sour cream, or cottage cheese for sour cream and mayonnaise in dips and dressings.  Try lemon juice, herbs, or spices to season food instead of salt, butter, or margarine. . Meal planning   Eat a balanced diet that includes: ? 5 or more servings of fruits and vegetables each day. At each meal, try to fill half of your plate with fruits and vegetables. ? Up to 6-8 servings of whole grains each day. ? Less than 6 oz of lean meat, poultry, or fish each day. A 3-oz serving of meat is about the same size as a deck of cards. One egg equals 1 oz. ? 2 servings of low-fat dairy each day. ? A serving of nuts, seeds, or beans 5 times each week. ? Heart-healthy fats. Healthy fats called Omega-3 fatty acids are found in foods such as flaxseeds and coldwater fish, like sardines, salmon, and mackerel.    Try to eat at least 2 vegetarian meals each week.  Eat more home-cooked food and less restaurant, buffet, and fast food.  When eating  at a restaurant, ask that your food be prepared with less salt or no salt, if possible.  See you September 20th at 10:15 AM.

## 2016-08-20 ENCOUNTER — Ambulatory Visit: Payer: Self-pay | Admitting: Obstetrics & Gynecology

## 2016-08-20 ENCOUNTER — Ambulatory Visit (INDEPENDENT_AMBULATORY_CARE_PROVIDER_SITE_OTHER): Payer: Medicaid Other | Admitting: Obstetrics and Gynecology

## 2016-08-20 ENCOUNTER — Other Ambulatory Visit (HOSPITAL_COMMUNITY)
Admission: RE | Admit: 2016-08-20 | Discharge: 2016-08-20 | Disposition: A | Discharge status: Home / Self Care | Payer: Medicaid Other | Source: Ambulatory Visit | Attending: Obstetrics and Gynecology | Admitting: Obstetrics and Gynecology

## 2016-08-20 ENCOUNTER — Encounter: Payer: Self-pay | Admitting: Obstetrics and Gynecology

## 2016-08-20 VITALS — BP 106/51 | HR 77 | Ht 64.0 in | Wt 267.3 lb

## 2016-08-20 DIAGNOSIS — N761 Subacute and chronic vaginitis: Secondary | ICD-10-CM

## 2016-08-20 DIAGNOSIS — Z113 Encounter for screening for infections with a predominantly sexual mode of transmission: Secondary | ICD-10-CM

## 2016-08-20 DIAGNOSIS — A5909 Other urogenital trichomoniasis: Secondary | ICD-10-CM | POA: Diagnosis not present

## 2016-08-20 MED ORDER — NYSTATIN 100000 UNIT/GM EX CREA: 1.0000 "application " | TOPICAL_CREAM | Freq: Two times a day (BID) | CUTANEOUS | 1 refills | Status: DC

## 2016-08-20 NOTE — Progress Notes (Signed)
56 yo G1P1 postmenopausal for the past 3-4 years here for the evaluation of vaginal pruritis which has been present for the past 5 months intermittently. She was seen by her PCP who diagnosed her with vaginal atrophy. She did not start the treatment for that due to cost. Patient is not sexually active and denies symptoms of incontinence. She is without any other GYN complaints  Past Medical History:  Diagnosis Date  . Anemia, iron deficiency    secondary to menhorrhagia, on oral iron, also b12 def, getting monthly b12 shots  . CHF (congestive heart failure) ([place])   . Chronic cough    secondary to alleriges and post nasal drip  . Cognitive impairment   . COPD (chronic obstructive pulmonary disease) ([place])   . Cor pulmonale (HCC)    PA Peak pressure 97mHg  . Depression   . Diabetes mellitus    well controlled on metformin  . Diastolic heart failure ([place]   . GERD (gastroesophageal reflux disease)   . H/O mental retardation   . Herpes   . Hyperlipidemia   . Hypertension   . OSA (obstructive sleep apnea)    CPAP  . Pulmonary hypertension ([place]   . Renal disorder   . Restrictive lung disease    PFTs 06/2012 (FVC 54% predicted and FEV1 68% predicted w minimal bronchodilator response).  . Shortness of breath   . Venous stasis ulcer ([place]    chornic, ?followed up at wound care center, multiple courses of antibiotics in past for cellulitis, on lasix   Past Surgical History:  Procedure Laterality Date  . CARDIAC CATHETERIZATION N/A 01/13/2016   Procedure: Right Heart Cath;  Surgeon: DJolaine Artist MD;  [facility];  Service: Cardiovascular;  Laterality: N/A;  . CHOLECYSTECTOMY    . LEFT AND RIGHT HEART CATHETERIZATION WITH CORONARY ANGIOGRAM N/A 02/28/2014   Procedure: LEFT AND RIGHT HEART CATHETERIZATION WITH CORONARY ANGIOGRAM;  Surgeon: DJolaine Artist MD;  [facility];  Service: Cardiovascular;  Laterality: N/A;   Family History  Problem Relation  Age of Onset  . Mental illness Sister   . Mental retardation Brother   . Breast cancer Maternal Aunt    Social History  Substance Use Topics  . Smoking status: Never Smoker  . Smokeless tobacco: Never Used  . Alcohol use No   ROS See pertinent in HPI Blood pressure (!) 106/51, pulse 77, height _0  (1.626 m), weight 267 lb 4.8 oz (121.2 kg), last menstrual period 02/19/2011. GENERAL: Well-developed, well-nourished female in no acute distress.  PELVIC: Normal external female genitalia with evidence of excoriation on left labia majora. Vagina is atrophic.  Normal discharge. Normal appearing cervix. Uterus is normal in size. No adnexal mass or tenderness. EXTREMITIES: No cyanosis, clubbing, or edema, 2+ distal pulses.  A/P 56yo with chronic vulvovaginitis - Wet prep collected - Rx nystatin provided - Patient advised to used prescribed cream for vaginal atrophy - patient will be contacted with abnormal results - Patient with normal pap smear in 2016 and normal mammogram in April 2018

## 2016-08-23 LAB — CERVICOVAGINAL ANCILLARY ONLY
BACTERIAL VAGINITIS: NEGATIVE
Candida vaginitis: NEGATIVE
TRICH (WINDOWPATH): POSITIVE — AB

## 2016-08-25 ENCOUNTER — Other Ambulatory Visit: Payer: Self-pay | Admitting: Obstetrics and Gynecology

## 2016-08-25 MED ORDER — METRONIDAZOLE 500 MG PO TABS: 500.0000 mg | ORAL_TABLET | Freq: Two times a day (BID) | ORAL | 0 refills | Status: DC

## 2016-09-01 ENCOUNTER — Telehealth: Payer: Self-pay | Admitting: *Deleted

## 2016-09-01 NOTE — Telephone Encounter (Addendum)
-----  Message from Mora Bellman, MD sent at 08/25/2016  7:29 PM EDT ----- Please inform patient of Trich infection. Rx has been e-prescribed  Thanks  Peggy  8/29  1255  Called pt and informed of +trichomonas infection.  Pt states that she received a call from her pharmacy and she is now taking the medication prescribed. Pt stated confusion of these results due to the fact that she is not sexually active and has not been for 20+ years. She also denied the use of sex toys, vibrators, etc.  I advised pt that I could not explain how she would get the infection without sexual contact. I advised her to complete the course of medication anyway.  Pt agreed and voiced understanding of all information and instructions given.

## 2016-09-16 ENCOUNTER — Telehealth: Payer: Self-pay | Admitting: Internal Medicine

## 2016-09-16 ENCOUNTER — Encounter (HOSPITAL_COMMUNITY): Payer: Self-pay | Admitting: Internal Medicine

## 2016-09-16 ENCOUNTER — Ambulatory Visit (HOSPITAL_COMMUNITY)
Admission: RE | Admit: 2016-09-16 | Discharge: 2016-09-16 | Disposition: A | Discharge status: Home / Self Care | Payer: Medicaid Other | Source: Ambulatory Visit | Attending: Internal Medicine | Admitting: Internal Medicine

## 2016-09-16 VITALS — BP 112/62 | HR 88 | Wt 269.8 lb

## 2016-09-16 DIAGNOSIS — K219 Gastro-esophageal reflux disease without esophagitis: Secondary | ICD-10-CM | POA: Insufficient documentation

## 2016-09-16 DIAGNOSIS — E119 Type 2 diabetes mellitus without complications: Secondary | ICD-10-CM | POA: Insufficient documentation

## 2016-09-16 DIAGNOSIS — F329 Major depressive disorder, single episode, unspecified: Secondary | ICD-10-CM | POA: Insufficient documentation

## 2016-09-16 DIAGNOSIS — I2781 Cor pulmonale (chronic): Secondary | ICD-10-CM | POA: Diagnosis not present

## 2016-09-16 DIAGNOSIS — I2721 Secondary pulmonary arterial hypertension: Secondary | ICD-10-CM | POA: Diagnosis not present

## 2016-09-16 DIAGNOSIS — F79 Unspecified intellectual disabilities: Secondary | ICD-10-CM | POA: Diagnosis not present

## 2016-09-16 DIAGNOSIS — J449 Chronic obstructive pulmonary disease, unspecified: Secondary | ICD-10-CM | POA: Insufficient documentation

## 2016-09-16 DIAGNOSIS — I480 Paroxysmal atrial fibrillation: Secondary | ICD-10-CM | POA: Insufficient documentation

## 2016-09-16 DIAGNOSIS — Z7984 Long term (current) use of oral hypoglycemic drugs: Secondary | ICD-10-CM | POA: Insufficient documentation

## 2016-09-16 DIAGNOSIS — E785 Hyperlipidemia, unspecified: Secondary | ICD-10-CM | POA: Diagnosis not present

## 2016-09-16 DIAGNOSIS — I110 Hypertensive heart disease with heart failure: Secondary | ICD-10-CM | POA: Diagnosis not present

## 2016-09-16 DIAGNOSIS — I272 Pulmonary hypertension, unspecified: Secondary | ICD-10-CM | POA: Diagnosis not present

## 2016-09-16 DIAGNOSIS — G4733 Obstructive sleep apnea (adult) (pediatric): Secondary | ICD-10-CM | POA: Insufficient documentation

## 2016-09-16 DIAGNOSIS — I5032 Chronic diastolic (congestive) heart failure: Secondary | ICD-10-CM | POA: Diagnosis not present

## 2016-09-16 DIAGNOSIS — D509 Iron deficiency anemia, unspecified: Secondary | ICD-10-CM | POA: Diagnosis not present

## 2016-09-16 DIAGNOSIS — Z7901 Long term (current) use of anticoagulants: Secondary | ICD-10-CM | POA: Diagnosis not present

## 2016-09-16 DIAGNOSIS — Z79899 Other long term (current) drug therapy: Secondary | ICD-10-CM | POA: Insufficient documentation

## 2016-09-16 LAB — BASIC METABOLIC PANEL
ANION GAP: 5 (ref 5–15)
BUN: 11 mg/dL (ref 6–20)
CHLORIDE: 104 mmol/L (ref 101–111)
CO2: 29 mmol/L (ref 22–32)
Calcium: 8.9 mg/dL (ref 8.9–10.3)
Creatinine, Ser: 0.8 mg/dL (ref 0.44–1.00)
Glucose, Bld: 94 mg/dL (ref 65–99)
POTASSIUM: 3.9 mmol/L (ref 3.5–5.1)
SODIUM: 138 mmol/L (ref 135–145)

## 2016-09-16 LAB — BRAIN NATRIURETIC PEPTIDE: B NATRIURETIC PEPTIDE 5: 15.9 pg/mL (ref 0.0–100.0)

## 2016-09-16 NOTE — Patient Instructions (Signed)
Labs drawn today  Your physician recommends that you schedule a follow-up appointment in: 4 months we will call you.

## 2016-09-16 NOTE — Telephone Encounter (Signed)
DME REQUEST- Patient walked in today and is requesting all of her supplies for her CPAP Machine.  Please Advise.

## 2016-09-16 NOTE — Telephone Encounter (Signed)
Order has been faxed to [facility] @ [phone].  Please have patient to call [facility] @ [phone] if any questions.

## 2016-09-16 NOTE — Progress Notes (Signed)
[name], female   DOB: 12-08-1960, 56 y.o.   MRN: [id number]    [name], female   DOB: Jun 03, 1960, 56 y.o.    [facility] Cardiologist: Dr Kelby Aline    HPI: [name] is a 56 y.o. 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   She was admitted to [facility] in July 2014 with respiratory failure and RHC showed severe PAH with pulmonary pressures in the 90s. She was transferred to [facility] where she was started on IV Remodulin with excellent results.  She was admitted again 08/16/12 for CP and tachycardia, was found to be in atypical flutter vs atrial tach. Amiodarone was considered but she converted spontaneously. She was started on Xarelto. Her discharge weight was 215 lbs.    Had CP in 11/15 and Myoview with EF 78% with reversible defect in distal anterior wall and apex. (ischemia vs breast attenuation) EF 78%  She returns today for follow up.  Saw Dr. Gilles Chiquito at [facility] in 3/18. 6MW down slightly but oxygenation better. Felt possibly related to weight gain. Remodulin increased from 5 to 6. She doesn't feel that it has helped much. Having some diarrhea with that. + flushing.  Says her mouth is really dry. Drinking more more water. Weight has been going up so increased torsemide recently. Usually takes 58m torsemide 60 daily but will sometimes take 440 bid. Weight unchanged here since last visit. Can do ADLs ok. Uses walker when she goes out. Continue 3-4 liters oxygen. Taking all medications.  Wearing CPAP every night. Mild edema. Gets dizzy if she stands up too quickly or bends over.   Studies:  RHC 1/18  RA = 7 RV = 72/9 PA = 70/31 (47) PCW = 11 Fick cardiac output/index = 7.4/3.3 PVR = 4.9 WU FA sat = 90% PA sat = 69%,  68%    Cath 2/16: Minimal CAD LAD 30% otherwise normal RHC on Remodulin RA = 8 RV = 45/5/8 PA = 52/24 (34) PCW = 11 Fick cardiac output/index = 7.6/3.6 PVR = 3.0 WU FA sat = 99% PA sat = 70%, 71%  ECHO 08/11/12 EF 60-65% RV moderately dilated Peak PA pressure 48 mmhg  ECHO 02/28/13 EF 55-60% Grade I DD RV severely dilated and HK. Trivial TR. Septum flat Peak PA pressure 53 mmhg ECHO 11/15 EF 55-60% RV normal Trivial TR [facility] RV mildly dilated (improved function) Trivial TR normal IVC ECHO 11/2014: EF 55-60%.  RV modrately dilated. Peak PA pressure 78 mm hg [facility]2015[facility]. EF >55% RVSP 645mG  [facility]. Room air sat was 96% sats fell briefly to 89 at the very end of the walk she went 341 m this is 82% predicted  6MWD 9/17 was 362 m  This is 88% predicted on 3 L oxygen saturations did not fall below 93%.   6MWD 3/18 34524mLabs        09/29/12: K+ 3.6, Creatinine 0.87       02/12/13 K 3.4 Creatinine 0.67 ---> given extra potassium and started on 12.5 mg spironolactone.         05/17/2013  K 4.3 Creatinine 0.68  11/15  K 3.4 Cr 0.67       2/16    K 4.1 Cr 0.78       9/16:   K 3.8 Cr 1.08         02/18/2015: K 4.3 Creatinine 0.64            ROS: All systems negative except as listed in HPI, PMH and Problem List.  Past Medical History:  Diagnosis Date  . Anemia, iron deficiency    secondary to menhorrhagia, on oral iron, also b12 def, getting monthly b12 shots  . CHF (congestive heart failure) ([place])   . Chronic cough    secondary to alleriges and post nasal drip  . Cognitive impairment   . COPD (chronic obstructive pulmonary disease) ([place])   . Cor pulmonale (HCC)    PA Peak pressure 79mHg  . Depression   . Diabetes mellitus    well controlled on metformin  . Diastolic heart failure ([place]   . GERD (gastroesophageal reflux disease)   . H/O mental retardation   . Herpes   . Hyperlipidemia   . Hypertension    . OSA (obstructive sleep apnea)    CPAP  . Pulmonary hypertension ([place]   . Renal disorder   . Restrictive lung disease    PFTs 06/2012 (FVC 54% predicted and FEV1 68% predicted w minimal bronchodilator response).  . Shortness of breath   . Venous stasis ulcer ([place]    chornic, ?followed up at wound care center, multiple courses of antibiotics in past for cellulitis, on lasix    Current Outpatient Prescriptions  Medication Sig Dispense Refill  . acetaminophen (TYLENOL) 500 MG tablet Take 1 tablet (500 mg total) by mouth every 6 (six) hours as needed. (Patient taking differently: Take 1,000 mg by mouth every 6 (six) hours as needed for moderate pain. ) 30 tablet 0  . cetirizine (ZYRTEC) 10 MG tablet TAKE ONE TABLET BY MOUTH ONCE DAILY 30 tablet 1  . cyanocobalamin 500 MCG tablet Take 500 mcg by mouth daily.    . diphenhydrAMINE (BENADRYL) 25 mg capsule Take 25-75 mg by mouth 2 (two) times daily as needed for itching.     . ferrous sulfate 325 (65 FE) MG tablet Take 1 tablet (325 mg total) by mouth daily with breakfast. 90 tablet 3  . gabapentin (NEURONTIN) 300 MG capsule TAKE TWO CAPSULES BY MOUTH THREE TIMES DAILY 180 capsule 5  . glucose blood (ACCU-CHEK AVIVA PLUS) test strip Use  To check blood sugar up to 1 time a day 50 each 5  . ibuprofen (ADVIL,MOTRIN) 200 MG tablet Take 600 mg by mouth every 6 (six) hours as needed for moderate pain.    . metFORMIN (GLUCOPHAGE) 1000 MG tablet TAKE ONE-HALF TABLET BY MOUTH WITH BREAKFAST 45 tablet 2  . metroNIDAZOLE (FLAGYL) 500 MG tablet Take 1 tablet (500 mg total) by mouth 2 (two) times daily. 14 tablet 0  . nystatin cream (MYCOSTATIN) Apply 1 application topically 2 (two) times daily. 30 g 1  . pantoprazole (PROTONIX) 20 MG tablet Take 1 tablet (20 mg total) by mouth 2 (two) times daily. 180 tablet 1  . potassium chloride SA (KLOR-CON M20) 20 MEQ tablet TAKE TWO TABLETS BY MOUTH THREE TIMES DAILY, TAKE EXTRA TABLET WHEN TAKING AN EXTRA TORESMIDE  180 tablet 3  . pravastatin (PRAVACHOL) 20 MG tablet Take 1 tablet (20 mg total) by mouth daily. 90 tablet 2  . rivaroxaban (XARELTO) 20 MG TABS tablet Take 1 tablet (  20 mg total) by mouth daily with supper. 90 tablet 3  . sildenafil (REVATIO) 20 MG tablet Take 1 tablet (20 mg total) by mouth 3 (three) times daily. 270 tablet 3  . spironolactone (ALDACTONE) 25 MG tablet Take 0.5 tablets (12.5 mg total) by mouth daily. 45 tablet 1  . torsemide (DEMADEX) 20 MG tablet TAKE THREE TABLETS BY MOUTH ONCE DAILY 90 tablet 6  . treprostinil (REMODULIN) 5 MG/ML SOLN injection Inject into the skin continuous.    . sodium chloride (OCEAN) 0.65 % SOLN nasal spray Place 2 sprays into both nostrils as needed for congestion. (Patient not taking: Reported on 08/20/2016) 30 mL 0   No current facility-administered medications for this encounter.     Vitals:   09/16/16 0946  BP: 112/62  Pulse: 88  SpO2: 97%  Weight: 269 lb 12.8 oz (122.4 kg)   Filed Weights   09/16/16 0946  Weight: 269 lb 12.8 oz (122.4 kg)   Physical Exam:  General:  Obese womnn with walker. Flushed on 3L [place]. No resp difficulty HEENT: normal Neck: supple. Hard to see due to size  Carotids 2+ bilat; no bruits. No lymphadenopathy or thryomegaly appreciated. Cor: PMI nondisplaced. Regular rate & rhythm. Increased P2. 2/6 TR  + hickman Lungs: coarse  Abdomen: markedly obese soft, nontender, nondistended. No hepatosplenomegaly. No bruits or masses. Good bowel sounds. Extremities: no cyanosis, clubbing, rash, tr edema Neuro: alert & orientedx3, cranial nerves grossly intact. moves all 4 extremities w/o difficulty. Affect pleasant  ASSESSMENT & PLAN:  1) Chronic diastolic HF/cor pulmonale:  - Volume status stable,.  - Continue torsemide 60 mg daily. Takes extra as needed - Labs today.   2) PAH, severe with cor pulmonale.  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 However PAH far out of proportion to L-sided pressures or restrictive lung  disease. - Follows with [facility] q 6 months - Recent 6MW slightly decreased but in setting of marked weight gain - RHC numbers stable - Remodulin dose recently increased but having trouble tolerating due to GI side effects. - I have asked her to contact [facility] to consider decreasing remodulin dose and increasing sildenafil or adding ERA 3) Paroxysmal atrial flutter.  - Maintaining NSR> Continue Xarelto. 4) Obesity -  Again encouraged weight loss with low-carb diet.  Glori Bickers, MD 10:20 AM

## 2016-09-21 DIAGNOSIS — G4730 Sleep apnea, unspecified: Secondary | ICD-10-CM | POA: Diagnosis not present

## 2016-09-21 DIAGNOSIS — G4733 Obstructive sleep apnea (adult) (pediatric): Secondary | ICD-10-CM | POA: Diagnosis not present

## 2016-09-21 DIAGNOSIS — I270 Primary pulmonary hypertension: Secondary | ICD-10-CM | POA: Diagnosis not present

## 2016-09-21 DIAGNOSIS — J984 Other disorders of lung: Secondary | ICD-10-CM | POA: Diagnosis not present

## 2016-09-23 ENCOUNTER — Ambulatory Visit: Payer: Medicaid Other | Admitting: Dietician

## 2016-09-23 DIAGNOSIS — E1142 Type 2 diabetes mellitus with diabetic polyneuropathy: Secondary | ICD-10-CM

## 2016-09-23 NOTE — Progress Notes (Signed)
Patient attended diabetes education group visit today for 60 minutes. Hyperglycemia and hypoglycemia were discussed.  Blood sugars: not done Weight- 262.1#, she reports 258.8# this am at home, her goal is to attain and maintain a weigh tin the 250s# Plan: patient plans to attend group diabetes meeting in 1 month. Plyler, [name], RD 09/23/2016 1:16 PM.

## 2016-09-23 NOTE — Patient Instructions (Signed)
Thank you for coming to the diabetes group meeting today!  We discussed high and low blood sugars.   We look forward to seeing next month on October 21, 2016 at 10:15 AM  [name] [phone]

## 2016-09-24 ENCOUNTER — Other Ambulatory Visit: Payer: Self-pay | Admitting: Student in an Organized Health Care Education/Training Program

## 2016-09-24 DIAGNOSIS — E1142 Type 2 diabetes mellitus with diabetic polyneuropathy: Secondary | ICD-10-CM

## 2016-10-04 ENCOUNTER — Encounter: Payer: Self-pay | Admitting: *Deleted

## 2016-10-04 DIAGNOSIS — J984 Other disorders of lung: Secondary | ICD-10-CM | POA: Diagnosis not present

## 2016-10-04 DIAGNOSIS — I270 Primary pulmonary hypertension: Secondary | ICD-10-CM | POA: Diagnosis not present

## 2016-10-04 DIAGNOSIS — G4730 Sleep apnea, unspecified: Secondary | ICD-10-CM | POA: Diagnosis not present

## 2016-10-04 DIAGNOSIS — G4733 Obstructive sleep apnea (adult) (pediatric): Secondary | ICD-10-CM | POA: Diagnosis not present

## 2016-10-04 LAB — HM DIABETES EYE EXAM

## 2016-10-06 ENCOUNTER — Other Ambulatory Visit: Payer: Self-pay | Admitting: *Deleted

## 2016-10-06 MED ORDER — TORSEMIDE 20 MG PO TABS: ORAL_TABLET | ORAL | 0 refills | Status: DC

## 2016-10-20 ENCOUNTER — Telehealth: Payer: Self-pay | Admitting: *Deleted

## 2016-10-20 NOTE — Telephone Encounter (Signed)
Pantoprazole script changed to dr Software engineer

## 2016-10-21 ENCOUNTER — Ambulatory Visit (INDEPENDENT_AMBULATORY_CARE_PROVIDER_SITE_OTHER): Payer: Medicaid Other | Admitting: Dietician

## 2016-10-21 ENCOUNTER — Encounter: Payer: Self-pay | Admitting: Dietician

## 2016-10-21 VITALS — BP 135/84 | Wt 267.5 lb

## 2016-10-21 DIAGNOSIS — E1142 Type 2 diabetes mellitus with diabetic polyneuropathy: Secondary | ICD-10-CM

## 2016-10-21 NOTE — Patient Instructions (Signed)
Thank you for coming to the group diabetes meeting today.  We look forward to seeing you next month on November 18, 2016.   [name] [phone]

## 2016-10-21 NOTE — Progress Notes (Signed)
Diabetes Self Management Education and Support: Patient attended diabetes education group visit today for 60 minutes. The program included successes and challenges over the past few weeks, goal setting and evaluation, how to read a meter download report, what is fiber and why is it important and a cooking demo of chocolate chia pudding.  [name] reports doing better eating less sweets, but her challenge has been eating at [employer] too often.   Blood sugars: meter download shows average of 101 for 4 tests in past month.  Health maintenance: She did not want the flue shot today because she felt like she might have a cold. Her urine microalbumin was done today per health maintenance.  Weight- 267.5#, (262# last month) this is increased 5# since last month, her goal is to maintain a weight in the 250s# Plan: patient plans to attend group diabetes meeting in 1 month. [name], [name], [place] 10/21/2016 1:53 PM.

## 2016-10-22 LAB — MICROALBUMIN / CREATININE URINE RATIO
Creatinine, Urine: 137.4 mg/dL
MICROALBUM., U, RANDOM: 17.2 ug/mL
Microalb/Creat Ratio: 12.5 mg/g creat (ref 0.0–30.0)

## 2016-10-25 ENCOUNTER — Other Ambulatory Visit (HOSPITAL_COMMUNITY): Payer: Self-pay | Admitting: *Deleted

## 2016-10-25 DIAGNOSIS — I270 Primary pulmonary hypertension: Secondary | ICD-10-CM

## 2016-10-25 MED ORDER — SILDENAFIL CITRATE 20 MG PO TABS: 20.0000 mg | ORAL_TABLET | Freq: Three times a day (TID) | ORAL | 3 refills | Status: DC

## 2016-11-01 ENCOUNTER — Other Ambulatory Visit (HOSPITAL_COMMUNITY): Payer: Self-pay | Admitting: Pharmacist

## 2016-11-01 DIAGNOSIS — I270 Primary pulmonary hypertension: Secondary | ICD-10-CM

## 2016-11-01 MED ORDER — SILDENAFIL CITRATE 20 MG PO TABS: 20.0000 mg | ORAL_TABLET | Freq: Three times a day (TID) | ORAL | 3 refills | Status: DC

## 2016-11-04 DIAGNOSIS — J984 Other disorders of lung: Secondary | ICD-10-CM | POA: Diagnosis not present

## 2016-11-04 DIAGNOSIS — G4730 Sleep apnea, unspecified: Secondary | ICD-10-CM | POA: Diagnosis not present

## 2016-11-04 DIAGNOSIS — I270 Primary pulmonary hypertension: Secondary | ICD-10-CM | POA: Diagnosis not present

## 2016-11-04 DIAGNOSIS — G4733 Obstructive sleep apnea (adult) (pediatric): Secondary | ICD-10-CM | POA: Diagnosis not present

## 2016-11-08 ENCOUNTER — Encounter: Payer: Self-pay | Admitting: Internal Medicine

## 2016-11-08 ENCOUNTER — Ambulatory Visit: Payer: Medicaid Other | Admitting: Internal Medicine

## 2016-11-08 VITALS — BP 119/66 | HR 78 | Temp 98.0°F | Ht 64.0 in | Wt 273.1 lb

## 2016-11-08 DIAGNOSIS — E7800 Pure hypercholesterolemia, unspecified: Secondary | ICD-10-CM | POA: Diagnosis not present

## 2016-11-08 DIAGNOSIS — Z79899 Other long term (current) drug therapy: Secondary | ICD-10-CM | POA: Diagnosis not present

## 2016-11-08 DIAGNOSIS — I2721 Secondary pulmonary arterial hypertension: Secondary | ICD-10-CM

## 2016-11-08 DIAGNOSIS — Z810 Family history of intellectual disabilities: Secondary | ICD-10-CM | POA: Diagnosis not present

## 2016-11-08 DIAGNOSIS — Z7901 Long term (current) use of anticoagulants: Secondary | ICD-10-CM | POA: Diagnosis not present

## 2016-11-08 DIAGNOSIS — Z803 Family history of malignant neoplasm of breast: Secondary | ICD-10-CM | POA: Diagnosis not present

## 2016-11-08 DIAGNOSIS — I4892 Unspecified atrial flutter: Secondary | ICD-10-CM | POA: Diagnosis not present

## 2016-11-08 DIAGNOSIS — N950 Postmenopausal bleeding: Secondary | ICD-10-CM | POA: Diagnosis not present

## 2016-11-08 DIAGNOSIS — Z9981 Dependence on supplemental oxygen: Secondary | ICD-10-CM | POA: Diagnosis not present

## 2016-11-08 DIAGNOSIS — E119 Type 2 diabetes mellitus without complications: Secondary | ICD-10-CM | POA: Diagnosis not present

## 2016-11-08 DIAGNOSIS — G4733 Obstructive sleep apnea (adult) (pediatric): Secondary | ICD-10-CM | POA: Diagnosis not present

## 2016-11-08 DIAGNOSIS — I5032 Chronic diastolic (congestive) heart failure: Secondary | ICD-10-CM | POA: Diagnosis not present

## 2016-11-08 DIAGNOSIS — D219 Benign neoplasm of connective and other soft tissue, unspecified: Secondary | ICD-10-CM | POA: Diagnosis not present

## 2016-11-08 DIAGNOSIS — Z7984 Long term (current) use of oral hypoglycemic drugs: Secondary | ICD-10-CM | POA: Diagnosis not present

## 2016-11-08 DIAGNOSIS — N83209 Unspecified ovarian cyst, unspecified side: Secondary | ICD-10-CM | POA: Diagnosis not present

## 2016-11-08 DIAGNOSIS — E785 Hyperlipidemia, unspecified: Secondary | ICD-10-CM | POA: Diagnosis not present

## 2016-11-08 DIAGNOSIS — E1142 Type 2 diabetes mellitus with diabetic polyneuropathy: Secondary | ICD-10-CM

## 2016-11-08 DIAGNOSIS — I272 Pulmonary hypertension, unspecified: Secondary | ICD-10-CM

## 2016-11-08 DIAGNOSIS — Z818 Family history of other mental and behavioral disorders: Secondary | ICD-10-CM | POA: Diagnosis not present

## 2016-11-08 LAB — GLUCOSE, CAPILLARY: Glucose-Capillary: 80 mg/dL (ref 65–99)

## 2016-11-08 LAB — POCT GLYCOSYLATED HEMOGLOBIN (HGB A1C): Hemoglobin A1C: 5.8

## 2016-11-08 NOTE — Progress Notes (Signed)
CC: follow up on CHF, PAH, T2DM, OSA, Post menopausal bleeding  HPI:  [name] is a 56 y.o. Here to follow up on her Heart failure, T2DM, PAH and OSA.  She reports that she has a broken walker and is having trouble with her CPAP at home.  She got a new machine and the tubing isn't compatible with her mask and she needs an adapter.    Please see A&P for status of the patient's chronic medical conditions  Past Medical History:  Diagnosis Date  . Anemia, iron deficiency    secondary to menhorrhagia, on oral iron, also b12 def, getting monthly b12 shots  . CHF (congestive heart failure) ([place])   . Chronic cough    secondary to alleriges and post nasal drip  . Cognitive impairment   . COPD (chronic obstructive pulmonary disease) ([place])   . Cor pulmonale (HCC)    PA Peak pressure 25mHg  . Depression   . Diabetes mellitus    well controlled on metformin  . Diastolic heart failure ([place]   . GERD (gastroesophageal reflux disease)   . H/O mental retardation   . Herpes   . Hyperlipidemia   . Hypertension   . OSA (obstructive sleep apnea)    CPAP  . Pulmonary hypertension ([place]   . Renal disorder   . Restrictive lung disease    PFTs 06/2012 (FVC 54% predicted and FEV1 68% predicted w minimal bronchodilator response).  . Shortness of breath   . Venous stasis ulcer ([place]    chornic, ?followed up at wound care center, multiple courses of antibiotics in past for cellulitis, on lasix   Review of Systems:  ROS: Pulmonary: pt denies increased work of breathing, shortness of breath,  Cardiac: pt denies palpitations, chest pain,  Abdominal: pt denies abdominal pain, nausea, vomiting, or diarrhea  Physical Exam:  Vitals:   11/08/16 1535  BP: 119/66  Pulse: 78  Temp: 98 F (36.7 C)  TempSrc: Oral  SpO2: 94%  Weight: 273 lb 1.6 oz (123.9 kg)  Height: _0  (1.626 m)   Physical Exam  Constitutional: No distress.  Neck: No JVD present.  Cardiovascular: Normal rate,  regular rhythm and normal heart sounds. Exam reveals no gallop and no friction rub.  No murmur heard. Pulmonary/Chest: Effort normal. No respiratory distress. She has wheezes (very mild expiratory wheezes diffusely). She has no rales. She exhibits no tenderness.  Abdominal: Soft. Bowel sounds are normal. She exhibits no distension and no mass. There is no tenderness. There is no rebound and no guarding.  Musculoskeletal: She exhibits no edema or deformity.  Neurological: She is alert.  Skin: She is not diaphoretic.    Social History   Socioeconomic History  . Marital status: Single    Spouse name: Not on file  . Number of children: Not on file  . Years of education: Not on file  . Highest education level: Not on file  Social Needs  . Financial resource strain: Not on file  . Food insecurity - worry: Not on file  . Food insecurity - inability: Not on file  . Transportation needs - medical: Not on file  . Transportation needs - non-medical: Not on file  Occupational History  . Not on file  Tobacco Use  . Smoking status: Never Smoker  . Smokeless tobacco: Never Used  Substance and Sexual Activity  . Alcohol use: No    Alcohol/week: 0.0 oz  . Drug use: No  . Sexual activity:  No  Other Topics Concern  . Not on file  Social History Narrative  . Not on file    Family History  Problem Relation Age of Onset  . Mental illness Sister   . Mental retardation Brother   . Breast cancer Maternal Aunt     Assessment & Plan:   See Encounters Tab for problem based charting.  Patient seen with Dr. Daryll Drown

## 2016-11-08 NOTE — Patient Instructions (Signed)
[name] you are doing well, your heart failure and diabetes is doing well keep up the good work.  We have ordered a ultrasound of your uterus to help continue evaluating your postmenopausal bleeding.  I have ordered a new walker for you since your broke and I have given you a number to call to have someone come out and fix your CPAP.  Please make an appointment to follow up with your OBGYN about the vaginal itching and discomfort.

## 2016-11-09 LAB — LIPID PANEL
CHOL/HDL RATIO: 3.3 ratio (ref 0.0–4.4)
CHOLESTEROL TOTAL: 153 mg/dL (ref 100–199)
HDL: 47 mg/dL (ref >39)
LDL Calculated: 85 mg/dL (ref 0–99)
TRIGLYCERIDES: 107 mg/dL (ref 0–149)
VLDL Cholesterol Cal: 21 mg/dL (ref 5–40)

## 2016-11-09 NOTE — Assessment & Plan Note (Addendum)
Patient is being followed by her OBGYN for this issue.  She mentions she is unsure if it is hemorrhoidal bleeding or vaginal bleeding.  Pt is on xarelto for atrial flutter.  Reading the OBGYN note it seems she is due for a repeat transvaginal ultrasound.  She has a confirmed fibroid on a prior ultrasound as well as an ovarian cyst.  Had wet prep on 08/18/16, was positive for  trichomonas infection treated with metronidazole.    -ordered repeat transvaginal ultrasound -pt will continue to follow with OBGYN

## 2016-11-09 NOTE — Assessment & Plan Note (Addendum)
Pts A1C was 5.8 today, she is doing well with her diabetes, going to the dietician, trying to lose some weight, taking her metformin 550m daily.    -Continue metformin 5040mdaily

## 2016-11-09 NOTE — Assessment & Plan Note (Addendum)
Pt denies any increased shortness of breath.  She does not appear fluid overloaded on exam today.  On torsemide 77m daily, sometimes takes 478mBID when she has a little extra fluid.  Blood pressure continues to be well controlled.  Renal function remains great creatinine 0.8.  Had appointment last month with advanced heart failure.    -continue current regimen and following with [facility]

## 2016-11-09 NOTE — Assessment & Plan Note (Signed)
Checked new lipid panel as it has been 2 years since pt had lipid panel and was placed on simvastatin 47m.  Given her risk factors I think it would be reasonable to start patient on a stronger statin if lipid panel remains elevated given risk factors.    -will explore changing statin on next visit

## 2016-11-09 NOTE — Assessment & Plan Note (Addendum)
Followed by [facility], and here at advanced heart failure clinic.    Stable, pt denies increased shortness of breath.  Pt came in without her oxygen again, supposed to be on 3-4L  admits to not wearing it frequently, encouraged her to wear her oxygen.  Still wearing cpap every night.  Pt has appointment for 11/27/16 with [facility] pulm.    -continue remodulin and sildenafil

## 2016-11-09 NOTE — Assessment & Plan Note (Signed)
Patient reports good compliance with CPAP however, has had some trouble since getting new machine recently.  Gave pt phone number to call home health when she gets home and have them help her convert tubing for her new CPAP where it will fit together better without any leak.

## 2016-11-10 NOTE — Progress Notes (Signed)
Internal Medicine Clinic Attending  I saw and evaluated the patient.  I personally confirmed the key portions of the history and exam documented by Dr. Shan Levans and I reviewed pertinent patient test results.  The assessment, diagnosis, and plan were formulated together and I agree with the documentation in the resident's note.

## 2016-11-18 ENCOUNTER — Other Ambulatory Visit: Payer: Self-pay | Admitting: *Deleted

## 2016-11-18 ENCOUNTER — Ambulatory Visit: Payer: Medicaid Other | Admitting: Dietician

## 2016-11-18 DIAGNOSIS — E876 Hypokalemia: Secondary | ICD-10-CM

## 2016-11-18 DIAGNOSIS — E1142 Type 2 diabetes mellitus with diabetic polyneuropathy: Secondary | ICD-10-CM

## 2016-11-18 MED ORDER — POTASSIUM CHLORIDE CRYS ER 20 MEQ PO TBCR: EXTENDED_RELEASE_TABLET | ORAL | 3 refills | Status: DC

## 2016-11-18 NOTE — Patient Instructions (Signed)
Good to see you today [name]!  Keep working on being more activite a little at a time.    What exercise I did For how many minutes  Monday    Tuesday    Wednesday    Thursday Attended diabetes group- walked to office 10 minutes each way  Friday    Saturday    Sunday         Challenge yourself to try to eat the following every day... 1. Whole grains: 2-3 servings- whole wheat cereal, crackers, bread, pasta, old fashioned oats & brown rice 2. Whole fruits-1 cup a day 3. Vegetables- 2-3 cups a day 4. Lowfat Protein: Chicken, [place], lean cuts of beef and pork, fish 2-3 times a week 5. Nuts, Seeds and KAJ:GOTLXBW each day- add walnuts to cereal, peanut butter sandwich.

## 2016-11-18 NOTE — Progress Notes (Signed)
Diabetes Self Management Education and Support: Start time: 10:15 am  end time: 11:15 am  Patient attended diabetes education group visit today for 60 minutes. The program included successes and challenges over the past few weeks, goal setting and evaluation, and how to stay on track over the holidays.    Blood sugars: does not check at home, last A1C 5.8 only on metformin Weight- not recorded today Plan: patient plans to attend group diabetes meeting in 1 month. She is working on getting motivated to be more active.  Ivyana Locey, [name], [place], CDE 11/18/2016 5:53 PM.

## 2016-12-04 DIAGNOSIS — G4730 Sleep apnea, unspecified: Secondary | ICD-10-CM | POA: Diagnosis not present

## 2016-12-04 DIAGNOSIS — G4733 Obstructive sleep apnea (adult) (pediatric): Secondary | ICD-10-CM | POA: Diagnosis not present

## 2016-12-04 DIAGNOSIS — I270 Primary pulmonary hypertension: Secondary | ICD-10-CM | POA: Diagnosis not present

## 2016-12-04 DIAGNOSIS — J984 Other disorders of lung: Secondary | ICD-10-CM | POA: Diagnosis not present

## 2016-12-14 ENCOUNTER — Ambulatory Visit (HOSPITAL_COMMUNITY): Payer: Medicaid Other

## 2016-12-23 ENCOUNTER — Ambulatory Visit (INDEPENDENT_AMBULATORY_CARE_PROVIDER_SITE_OTHER): Payer: Medicaid Other | Admitting: Dietician

## 2016-12-23 ENCOUNTER — Encounter (INDEPENDENT_AMBULATORY_CARE_PROVIDER_SITE_OTHER): Payer: Self-pay

## 2016-12-23 ENCOUNTER — Encounter: Payer: Self-pay | Admitting: Dietician

## 2016-12-23 DIAGNOSIS — E119 Type 2 diabetes mellitus without complications: Secondary | ICD-10-CM

## 2016-12-23 DIAGNOSIS — E1142 Type 2 diabetes mellitus with diabetic polyneuropathy: Secondary | ICD-10-CM

## 2016-12-23 DIAGNOSIS — Z7984 Long term (current) use of oral hypoglycemic drugs: Secondary | ICD-10-CM

## 2016-12-23 NOTE — Progress Notes (Signed)
Diabetes Self-Management Education  Visit Type:  Follow-up  Appt. Start Time: 1015 am Appt. End Time: 1115 am  12/23/2016  [name], identified by name and date of birth, is a 56 y.o. female with a diagnosis of Diabetes:  .   ASSESSMENT  Weight 271 lb 8 oz (123.2 kg), last menstrual period 02/19/2011. Body mass index is 46.6 kg/m.   Patient attended diabetes education group visit today for 60 minutes. The program included successes and challenges over the past few weeks, goal setting and evaluation, and how to stay on track over the holidays. Patient is still contemplating making changes that she used before when she lost weight. She is also trying to figure out how to incorporate more activity.     Blood sugars: does not check regularly at home, last A1C 5.8, on metformin Weight- 271.5# Plan: patient plans to attend group diabetes meeting in 1 month. She is working on getting motivated to be [employer] and eat smaller portions and healthier choices.    Diabetes Self-Management Education - 12/23/16 1600      Health Coping   How would you rate your overall health?  Good      Subsequent Visit   Since your last visit have you continued or begun to take your medications as prescribed?  Yes    Since your last visit have you had your blood pressure checked?  Yes       Learning Objective:  Patient will have a greater understanding of diabetes self-management. Patient education plan is to attend individual and/or group sessions per assessed needs and concerns.   Plan:   There are no Patient Instructions on file for this visit.   Expected Outcomes:    good when she is ready to make changes and has ongoing support  Education material provided: Support group flyer  If problems or questions, patient to contact team via:  Phone [place], [name], [place] 12/23/2016 4:37 PM.

## 2016-12-31 ENCOUNTER — Ambulatory Visit (HOSPITAL_COMMUNITY): Payer: Medicaid Other

## 2017-01-04 DIAGNOSIS — G4730 Sleep apnea, unspecified: Secondary | ICD-10-CM | POA: Diagnosis not present

## 2017-01-04 DIAGNOSIS — I270 Primary pulmonary hypertension: Secondary | ICD-10-CM | POA: Diagnosis not present

## 2017-01-04 DIAGNOSIS — J984 Other disorders of lung: Secondary | ICD-10-CM | POA: Diagnosis not present

## 2017-01-04 DIAGNOSIS — G4733 Obstructive sleep apnea (adult) (pediatric): Secondary | ICD-10-CM | POA: Diagnosis not present

## 2017-01-13 ENCOUNTER — Other Ambulatory Visit: Payer: Self-pay | Admitting: Internal Medicine

## 2017-01-13 MED ORDER — RIVAROXABAN 20 MG PO TABS: 20.0000 mg | ORAL_TABLET | Freq: Every day | ORAL | 3 refills | Status: DC

## 2017-01-13 NOTE — Telephone Encounter (Signed)
refilled 

## 2017-01-13 NOTE — Addendum Note (Signed)
Addended by: Ebbie Latus on: 01/13/2017 12:37 PM   Modules accepted: Orders

## 2017-01-13 NOTE — Telephone Encounter (Signed)
PATIENT NEEDS REFILL FOR XARELTO 20 MG, PHARMACY [phone]

## 2017-01-18 ENCOUNTER — Other Ambulatory Visit: Payer: Self-pay | Admitting: Internal Medicine

## 2017-01-18 DIAGNOSIS — K219 Gastro-esophageal reflux disease without esophagitis: Secondary | ICD-10-CM

## 2017-01-18 NOTE — Telephone Encounter (Signed)
refilled

## 2017-01-19 ENCOUNTER — Other Ambulatory Visit: Payer: Self-pay | Admitting: *Deleted

## 2017-01-19 MED ORDER — TORSEMIDE 20 MG PO TABS: ORAL_TABLET | ORAL | 1 refills | Status: DC

## 2017-01-19 NOTE — Telephone Encounter (Signed)
Refilled

## 2017-01-20 ENCOUNTER — Ambulatory Visit: Payer: Self-pay | Admitting: Dietician

## 2017-02-04 DIAGNOSIS — G4733 Obstructive sleep apnea (adult) (pediatric): Secondary | ICD-10-CM | POA: Diagnosis not present

## 2017-02-04 DIAGNOSIS — J984 Other disorders of lung: Secondary | ICD-10-CM | POA: Diagnosis not present

## 2017-02-04 DIAGNOSIS — I270 Primary pulmonary hypertension: Secondary | ICD-10-CM | POA: Diagnosis not present

## 2017-02-04 DIAGNOSIS — G4730 Sleep apnea, unspecified: Secondary | ICD-10-CM | POA: Diagnosis not present

## 2017-02-24 ENCOUNTER — Telehealth: Payer: Self-pay | Admitting: *Deleted

## 2017-02-24 ENCOUNTER — Ambulatory Visit (INDEPENDENT_AMBULATORY_CARE_PROVIDER_SITE_OTHER): Payer: Medicaid Other | Admitting: Dietician

## 2017-02-24 ENCOUNTER — Encounter (INDEPENDENT_AMBULATORY_CARE_PROVIDER_SITE_OTHER): Payer: Self-pay

## 2017-02-24 DIAGNOSIS — E1142 Type 2 diabetes mellitus with diabetic polyneuropathy: Secondary | ICD-10-CM

## 2017-02-24 DIAGNOSIS — Z6841 Body Mass Index (BMI) 40.0 and over, adult: Secondary | ICD-10-CM

## 2017-02-24 DIAGNOSIS — Z713 Dietary counseling and surveillance: Secondary | ICD-10-CM

## 2017-02-24 NOTE — Telephone Encounter (Signed)
Pt's here to see [name] today - c/o "sores" on her arms. Np available appts today - appt scheduled for tomorrow in [facility] @ 0945 AM.

## 2017-02-25 ENCOUNTER — Ambulatory Visit (INDEPENDENT_AMBULATORY_CARE_PROVIDER_SITE_OTHER): Payer: Medicaid Other | Admitting: Internal Medicine

## 2017-02-25 ENCOUNTER — Other Ambulatory Visit: Payer: Self-pay

## 2017-02-25 ENCOUNTER — Encounter: Payer: Self-pay | Admitting: Dietician

## 2017-02-25 VITALS — BP 128/84 | HR 89 | Temp 98.7°F | Wt 270.0 lb

## 2017-02-25 DIAGNOSIS — I872 Venous insufficiency (chronic) (peripheral): Secondary | ICD-10-CM | POA: Diagnosis not present

## 2017-02-25 DIAGNOSIS — R21 Rash and other nonspecific skin eruption: Secondary | ICD-10-CM | POA: Diagnosis not present

## 2017-02-25 DIAGNOSIS — Z978 Presence of other specified devices: Secondary | ICD-10-CM

## 2017-02-25 DIAGNOSIS — R600 Localized edema: Secondary | ICD-10-CM

## 2017-02-25 MED ORDER — MUPIROCIN 2 % EX OINT: 1.0000 "application " | TOPICAL_OINTMENT | Freq: Two times a day (BID) | CUTANEOUS | 0 refills | Status: DC

## 2017-02-25 NOTE — Assessment & Plan Note (Addendum)
Lower extremities concerning for stasis dermatitis. She does not have ulcers or breaks in the skin but in order to avoid progression to this she needs compression therapy. She denies symptoms of claudication but would need a rule out of arterial insufficiency before we can safely place compression. We attempted ABIs with the point of care machine in our office today but these were non conclusive due to body habitus.  - referral for ankle brachial indices, if these are normal she will need compression dressing

## 2017-02-25 NOTE — Progress Notes (Signed)
Internal Medicine Clinic Attending  Case discussed with Dr. Blum at the time of the visit.  We reviewed the resident's history and exam and pertinent patient test results.  I agree with the assessment, diagnosis, and plan of care documented in the resident's note. 

## 2017-02-25 NOTE — Assessment & Plan Note (Signed)
Presenting with multiple small pustules on bilateral upper extremities which appear consistent with folliculitis.  These become itchy at times.  She has not tried anything to relieve the rash yet.  Rashes not change throughout the day, she does not notice that it is any worse first thing in the morning. Denies sick contacts and no one else in her home has a similar rash, she is afebrile with stable vitals and no signs of infection today.  -Prescribed mupirocin ointment -If the rash is worse on follow-up could try a course of chlorhexidine wash

## 2017-02-25 NOTE — Progress Notes (Signed)
   Diabetes Self-Management Education  Visit Type:     Appt. Start Time: 1015 am Appt. End Time: 1115 am  02/25/2017  [name], identified by name and date of birth, is a 57 y.o. female with a diagnosis of Diabetes:  Marland Kitchen  Type 2 ASSESSMENT  Weight 272 lb 12.8 oz (123.7 kg), last menstrual period 02/19/2011. Body mass index is 46.83 kg/m.   Patient attended diabetes education group visit today for 60 minutes. The program included successes and challenges over the past few weeks, and how to prevent and care for long tern complications. Patient is still contemplating making changes that she used before when she lost weight. She is also trying to figure out how to incorporate more activity.     Blood sugars: does not check regularly at home, Lab Results  Component Value Date   HGBA1C 5.8 11/08/2016   Medicine: on metformin  Plan: patient plans to attend group diabetes meeting in 1 month. She is working on getting motivated to be more active and eat smaller portions and healthier choices.    Learning Objective:  Patient will have a greater understanding of diabetes self-management. Patient education plan is to attend individual and/or group sessions per assessed needs and concerns.  Plan:   There are no Patient Instructions on file for this visit.   Expected Outcomes:    good when she is ready to make changes based on her history and has ongoing support  Education material provided: Support group flyer, [employer] flyer  If problems or questions, patient to contact team via:  W. [employer]. [employer]kley, RD 02/25/2017 3:09 PM.

## 2017-02-25 NOTE — Progress Notes (Signed)
CC: Rash on the arms  HPI:  [name] is a 57 y.o. with PMH as listed below who presents for acute concern of rash on her arms. Please see the assessment and plans for the status of the patient chronic medical problems.   Past Medical History:  Diagnosis Date  . Anemia, iron deficiency    secondary to menhorrhagia, on oral iron, also b12 def, getting monthly b12 shots  . CHF (congestive heart failure) ([place])   . Chronic cough    secondary to alleriges and post nasal drip  . Cognitive impairment   . COPD (chronic obstructive pulmonary disease) ([place])   . Cor pulmonale (HCC)    PA Peak pressure 69mHg  . Depression   . Diabetes mellitus    well controlled on metformin  . Diastolic heart failure ([place]   . GERD (gastroesophageal reflux disease)   . H/O mental retardation   . Herpes   . Hyperlipidemia   . Hypertension   . OSA (obstructive sleep apnea)    CPAP  . Pulmonary hypertension ([place]   . Renal disorder   . Restrictive lung disease    PFTs 06/2012 (FVC 54% predicted and FEV1 68% predicted w minimal bronchodilator response).  . Shortness of breath   . Venous stasis ulcer ([place]    chornic, ?followed up at wound care center, multiple courses of antibiotics in past for cellulitis, on lasix   Review of Systems:  Refer to history of present illness and assessment and plans for pertinent review of systems, all others reviewed and negative  Physical Exam:  Vitals:   02/25/17 0957  BP: 128/84  Pulse: 89  Temp: 98.7 F (37.1 C)  TempSrc: Oral  SpO2: 97%  Weight: 270 lb (122.5 kg)   General: No acute distress, appears chronically ill Pulmonary: Wearing nasal cannula, no respiratory distress Skin: Catheter is visible over the right chest, the site is clean nonerythematous and Tegaderm overlying. Scattered small pustules over bilateral upper extremities, some are scabbed from scratching, some cars from healed lesions, no abscess  Skin of bilateral lower extremities  is thickened and erythematous with 1+ bilateral edema, no ulcerations or breaks in the skin  Assessment & Plan:   Rash Presenting with multiple small pustules on bilateral upper extremities which appear consistent with folliculitis.  These become itchy at times.  She has not tried anything to relieve the rash yet.  Rashes not change throughout the day, she does not notice that it is any worse first thing in the morning. Denies sick contacts and no one else in her home has a similar rash, she is afebrile with stable vitals and no signs of infection today.  -Prescribed mupirocin ointment -If the rash is worse on follow-up could try a course of chlorhexidine wash  Stasis dermatitis Lower extremities concerning for stasis dermatitis. She does not have ulcers or breaks in the skin but in order to avoid progression to this she needs compression therapy. She denies symptoms of claudication but would need a rule out of arterial insufficiency before we can safely place compression. We attempted ABIs with the point of care machine in our office today but these were non conclusive due to body habitus.  - referral for ankle brachial indices, if these are normal she will need compression dressing    See Encounters Tab for problem based charting.  Patient discussed with Dr. BLynnae January

## 2017-02-25 NOTE — Patient Instructions (Addendum)
Thank you for coming to the clinic today. It was a pleasure to see you.   For your skin rash, try using mupirocin cream   FOLLOW-UP INSTRUCTIONS When: April 29th with Dr. Shan Levans  (schedule this appointment through the front desk) For: general check up  What to bring: all of your medication bottles   Please call our clinic if you have any questions or concerns, we may be able to help and keep you from a long and expensive emergency room wait. Our clinic and after hours phone number is [phone], there is always someone available.

## 2017-03-01 ENCOUNTER — Other Ambulatory Visit: Payer: Self-pay | Admitting: *Deleted

## 2017-03-01 MED ORDER — GABAPENTIN 300 MG PO CAPS: 600.0000 mg | ORAL_CAPSULE | Freq: Three times a day (TID) | ORAL | 1 refills | Status: DC

## 2017-03-01 NOTE — Telephone Encounter (Signed)
refilled 

## 2017-03-03 ENCOUNTER — Ambulatory Visit (HOSPITAL_COMMUNITY)
Admission: RE | Admit: 2017-03-03 | Discharge: 2017-03-03 | Disposition: A | Discharge status: Home / Self Care | Payer: Medicaid Other | Source: Ambulatory Visit | Attending: Internal Medicine | Admitting: Internal Medicine

## 2017-03-03 ENCOUNTER — Other Ambulatory Visit: Payer: Self-pay | Admitting: Dietician

## 2017-03-03 DIAGNOSIS — R600 Localized edema: Secondary | ICD-10-CM

## 2017-03-03 DIAGNOSIS — E1142 Type 2 diabetes mellitus with diabetic polyneuropathy: Secondary | ICD-10-CM

## 2017-03-03 NOTE — Progress Notes (Signed)
Request referral for diabetes self management training and medical nutrition therapy for 2019. Please signorder in this note if you agree.  Thank you!

## 2017-03-03 NOTE — Progress Notes (Signed)
Bilateral ABIs and TBIs completed. Bilateral ABIs and TBIs are normal. Toma Copier, RVS 03/03/2017 2:58 PM

## 2017-03-04 ENCOUNTER — Telehealth: Payer: Self-pay | Admitting: *Deleted

## 2017-03-04 DIAGNOSIS — J984 Other disorders of lung: Secondary | ICD-10-CM | POA: Diagnosis not present

## 2017-03-04 DIAGNOSIS — I270 Primary pulmonary hypertension: Secondary | ICD-10-CM | POA: Diagnosis not present

## 2017-03-04 DIAGNOSIS — G4733 Obstructive sleep apnea (adult) (pediatric): Secondary | ICD-10-CM | POA: Diagnosis not present

## 2017-03-04 DIAGNOSIS — G4730 Sleep apnea, unspecified: Secondary | ICD-10-CM | POA: Diagnosis not present

## 2017-03-04 NOTE — Telephone Encounter (Signed)
-----  Message from Ledell Noss, MD sent at 03/04/2017  1:23 PM EST ----- Please call and notify [name] that the results of the test of the arteries in her legs shows good blood flow so she should wear compression stockings to improve the swelling in her legs.   ----- Message ----- From: Interface, Rad Results In Sent: 03/03/2017   5:02 PM To: Ledell Noss, MD

## 2017-03-04 NOTE — Telephone Encounter (Signed)
Pt called / Informed "the results of the test of the arteries in her legs shows good blood flow so she should wear compression stockings to improve the swelling in her legs." per Dr Hetty Ely. Pt stated she cannot wear compression stocking, opposite affect, causes her legs to swell worse. Sated she will try using ace bandages which she can do herself.

## 2017-03-22 ENCOUNTER — Other Ambulatory Visit: Payer: Self-pay | Admitting: Internal Medicine

## 2017-03-22 DIAGNOSIS — Z1231 Encounter for screening mammogram for malignant neoplasm of breast: Secondary | ICD-10-CM

## 2017-03-24 ENCOUNTER — Ambulatory Visit: Payer: Self-pay | Admitting: Dietician

## 2017-04-04 DIAGNOSIS — J984 Other disorders of lung: Secondary | ICD-10-CM | POA: Diagnosis not present

## 2017-04-04 DIAGNOSIS — G4730 Sleep apnea, unspecified: Secondary | ICD-10-CM | POA: Diagnosis not present

## 2017-04-04 DIAGNOSIS — G4733 Obstructive sleep apnea (adult) (pediatric): Secondary | ICD-10-CM | POA: Diagnosis not present

## 2017-04-04 DIAGNOSIS — I270 Primary pulmonary hypertension: Secondary | ICD-10-CM | POA: Diagnosis not present

## 2017-04-15 ENCOUNTER — Other Ambulatory Visit: Payer: Self-pay | Admitting: Internal Medicine

## 2017-04-15 DIAGNOSIS — I50810 Right heart failure, unspecified: Secondary | ICD-10-CM

## 2017-04-15 DIAGNOSIS — K219 Gastro-esophageal reflux disease without esophagitis: Secondary | ICD-10-CM

## 2017-04-15 NOTE — Telephone Encounter (Signed)
Patient would like a call back about her medication refills.  Patient unsure of the names of the medicine and needs help.

## 2017-04-15 NOTE — Telephone Encounter (Signed)
Called pt - needs refills on her medications.

## 2017-04-16 MED ORDER — PANTOPRAZOLE SODIUM 20 MG PO TBEC: 20.0000 mg | DELAYED_RELEASE_TABLET | Freq: Two times a day (BID) | ORAL | 1 refills | Status: DC

## 2017-04-16 MED ORDER — TORSEMIDE 20 MG PO TABS
ORAL_TABLET | ORAL | 2 refills | Status: DC
Start: 2017-04-16 — End: 2017-07-05

## 2017-04-16 MED ORDER — SPIRONOLACTONE 25 MG PO TABS: 12.5000 mg | ORAL_TABLET | Freq: Every day | ORAL | 1 refills | Status: DC

## 2017-04-16 MED ORDER — METFORMIN HCL 500 MG PO TABS: ORAL_TABLET | ORAL | 2 refills | Status: DC

## 2017-04-16 NOTE — Telephone Encounter (Signed)
refilled 

## 2017-04-21 ENCOUNTER — Ambulatory Visit (INDEPENDENT_AMBULATORY_CARE_PROVIDER_SITE_OTHER): Payer: Medicaid Other | Admitting: Dietician

## 2017-04-21 DIAGNOSIS — Z7984 Long term (current) use of oral hypoglycemic drugs: Secondary | ICD-10-CM

## 2017-04-21 DIAGNOSIS — Z713 Dietary counseling and surveillance: Secondary | ICD-10-CM | POA: Diagnosis not present

## 2017-04-21 DIAGNOSIS — Z6841 Body Mass Index (BMI) 40.0 and over, adult: Secondary | ICD-10-CM | POA: Diagnosis not present

## 2017-04-21 DIAGNOSIS — E1142 Type 2 diabetes mellitus with diabetic polyneuropathy: Secondary | ICD-10-CM | POA: Diagnosis not present

## 2017-04-21 NOTE — Patient Instructions (Signed)
Follow up 1 month for group. I will call about follow up sooner with me.  Try to snack less after dinner. Could drink water instead or eat fruit or vegetables or cold unsweetened, high fiber cereal like shredded wheat

## 2017-04-21 NOTE — Progress Notes (Signed)
   Diabetes Self-Management Education  Visit Type:     Appt. Start Time: 1015 am Appt. End Time: 1115 am  04/21/2017  [name], identified by name and date of birth, is a 57 y.o. female with a diagnosis of Diabetes:  Marland Kitchen  Type 2 ASSESSMENT  Weight 279 lb (126.6 kg), last menstrual period 02/19/2011. Body mass index is 47.89 kg/m.   Patient attended diabetes education group visit today for 60 minutes. The program included successes and challenges over the past few weeks, and how to prevent and care for long tern complications. Patient is still contemplating making changes that she used before when she lost weight. Less sweets, fast food and processed snack foods as well as  less evening snacking.  She is also trying to figure out how to incorporate more activity.     Blood sugars: does not check regularly at home, Lab Results  Component Value Date   HGBA1C 5.8 11/08/2016   Medicine: on metformin  Plan: patient plans to attend group diabetes meeting in 1 month. Her goal for the next month is to try to eat less evening snacks.  Considering smaller intervals between visits.    Learning Objective:  Patient will have a greater understanding of diabetes self-management. Patient education plan is to attend individual and/or group sessions per assessed needs and concerns.  Plan:   There are no Patient Instructions on file for this visit.   Expected Outcomes:    limited based on her history, better if she had more ongoing support  Education material provided: Support group flyer,  If problems or questions, patient to contact team via:  W. [employer]. [employer]kley, [place] 04/21/2017 4:58 PM.

## 2017-04-25 ENCOUNTER — Ambulatory Visit
Admission: RE | Admit: 2017-04-25 | Discharge: 2017-04-25 | Disposition: A | Discharge status: Home / Self Care | Payer: Medicaid Other | Source: Ambulatory Visit | Attending: Internal Medicine | Admitting: Internal Medicine

## 2017-04-25 DIAGNOSIS — Z1231 Encounter for screening mammogram for malignant neoplasm of breast: Secondary | ICD-10-CM | POA: Diagnosis not present

## 2017-04-27 ENCOUNTER — Ambulatory Visit (HOSPITAL_COMMUNITY): Admission: RE | Admit: 2017-04-27 | Payer: Medicaid Other | Source: Ambulatory Visit

## 2017-05-04 DIAGNOSIS — G4730 Sleep apnea, unspecified: Secondary | ICD-10-CM | POA: Diagnosis not present

## 2017-05-04 DIAGNOSIS — I270 Primary pulmonary hypertension: Secondary | ICD-10-CM | POA: Diagnosis not present

## 2017-05-04 DIAGNOSIS — G4733 Obstructive sleep apnea (adult) (pediatric): Secondary | ICD-10-CM | POA: Diagnosis not present

## 2017-05-04 DIAGNOSIS — J984 Other disorders of lung: Secondary | ICD-10-CM | POA: Diagnosis not present

## 2017-05-06 ENCOUNTER — Ambulatory Visit (HOSPITAL_COMMUNITY)
Admission: RE | Admit: 2017-05-06 | Discharge: 2017-05-06 | Disposition: A | Discharge status: Home / Self Care | Payer: Medicaid Other | Source: Ambulatory Visit | Attending: Internal Medicine | Admitting: Internal Medicine

## 2017-05-06 DIAGNOSIS — D259 Leiomyoma of uterus, unspecified: Secondary | ICD-10-CM | POA: Insufficient documentation

## 2017-05-06 DIAGNOSIS — N83202 Unspecified ovarian cyst, left side: Secondary | ICD-10-CM | POA: Diagnosis not present

## 2017-05-06 DIAGNOSIS — N950 Postmenopausal bleeding: Secondary | ICD-10-CM | POA: Diagnosis present

## 2017-05-16 ENCOUNTER — Other Ambulatory Visit: Payer: Self-pay | Admitting: Internal Medicine

## 2017-05-16 DIAGNOSIS — E876 Hypokalemia: Secondary | ICD-10-CM

## 2017-05-17 NOTE — Telephone Encounter (Signed)
refilled 

## 2017-05-19 ENCOUNTER — Ambulatory Visit: Payer: Medicaid Other | Admitting: Dietician

## 2017-05-19 ENCOUNTER — Encounter: Payer: Self-pay | Admitting: Internal Medicine

## 2017-05-19 ENCOUNTER — Telehealth: Payer: Self-pay | Admitting: Internal Medicine

## 2017-05-19 ENCOUNTER — Encounter: Payer: Self-pay | Admitting: Dietician

## 2017-05-19 DIAGNOSIS — N83209 Unspecified ovarian cyst, unspecified side: Secondary | ICD-10-CM | POA: Insufficient documentation

## 2017-05-19 DIAGNOSIS — N83201 Unspecified ovarian cyst, right side: Secondary | ICD-10-CM | POA: Insufficient documentation

## 2017-05-19 DIAGNOSIS — E1142 Type 2 diabetes mellitus with diabetic polyneuropathy: Secondary | ICD-10-CM

## 2017-05-19 NOTE — Progress Notes (Signed)
   Diabetes Self-Management Education  Visit Type:     Appt. Start Time: 1015 am Appt. End Time: 1115 am  05/19/2017  Ms. Bee Marchiano, identified by name and date of birth, is a 57 y.o. female with a diagnosis of Diabetes:  Marland Kitchen  Type 2 ASSESSMENT  Weight 271 lb 1.6 oz (123 kg), last menstrual period 02/19/2011. Body mass index is 46.53 kg/m.   Patient attended diabetes education group visit today for 60 minutes. The program included successes and challenges over the past few weeks, and how to include and make healthy snacks like popcorn. Patient is still contemplating making changes that she used before when she lost weight. Less sweets, fast food and processed snack foods as well as  less evening snacking.  She set up a 1:1 visit to enlist help in tailoring the information to her situation.     Blood sugars: does not check regularly at home, Lab Results  Component Value Date   HGBA1C 5.8 11/08/2016   Medicine: on metformin  Plan: patient plans to attend group diabetes meeting in 1 month and 1:1 next week.    Learning Objective:  Patient will have a greater understanding of diabetes self-management. Patient education plan is to attend individual and/or group sessions per assessed needs and concerns.  Plan:   There are no Patient Instructions on file for this visit.   Expected Outcomes:    limited based on her history, better if she had more ongoing support  Education material provided: Support group flyer,  If problems or questions, patient to contact team via:  W. R. Berkley, RD 05/19/2017 3:39 PM.

## 2017-05-19 NOTE — Telephone Encounter (Signed)
Informed pt of results of pelvic US results which suggested several benign abnormalities contributing to bleeding.  Pt report has not had any further bleeding, seems to come and go every 6 months to sometimes a few years.

## 2017-05-20 ENCOUNTER — Other Ambulatory Visit: Payer: Self-pay | Admitting: *Deleted

## 2017-05-20 DIAGNOSIS — E7800 Pure hypercholesterolemia, unspecified: Secondary | ICD-10-CM

## 2017-05-22 MED ORDER — PRAVASTATIN SODIUM 20 MG PO TABS: 20.0000 mg | ORAL_TABLET | Freq: Every day | ORAL | 0 refills | Status: DC

## 2017-05-22 NOTE — Telephone Encounter (Signed)
Will refill one month supply will discuss with pt at next visit increasing intensity

## 2017-05-26 ENCOUNTER — Encounter: Payer: Self-pay | Admitting: Dietician

## 2017-05-26 ENCOUNTER — Ambulatory Visit: Payer: Medicaid Other | Admitting: Dietician

## 2017-05-26 NOTE — Progress Notes (Signed)
Documentation: patient seen at her request for a 1:1 visit for assistance with weight loss, meal panning. She kept a 2 day food record and thought this was helpful. These showed fruit daily, 1-2 servings vegetables in 3 days, eats at fast food, chooses low fiber, low nutrient density and high calorie density food choices. She refused a weight today, but says she intends to restart home daily weights.  She relies on food pantries for some of her food. Her kitchen is small with no usable kitchen table. She is verbalizes being contemplative about increasing her activity.  Estimated body mass index is 46.53 kg/m as calculated from the following:   Height as of 11/08/16: _0  (1.626 m).   Weight as of 05/19/17: 271 lb 1.6 oz (123 kg).   Wt Readings from Last 3 Encounters:  05/19/17 271 lb 1.6 oz (123 kg)  04/21/17 279 lb (126.6 kg)  02/25/17 270 lb (122.5 kg)    P: Continue to keep food records/ weights. Set small acheivable goals.  Follow up weekly to adjust plan as needed. Debera Lat, RD 05/26/2017 1:51 PM.

## 2017-05-26 NOTE — Patient Instructions (Signed)
Goal is to write down what you eat, time eaten, how much you eat/drink  Eat 1 cup fruit and 1 cup vegetable every morning, every afternoon and every evening.  You said you want to write down your weights.

## 2017-06-02 ENCOUNTER — Ambulatory Visit: Payer: Self-pay | Admitting: Dietician

## 2017-06-04 DIAGNOSIS — J984 Other disorders of lung: Secondary | ICD-10-CM | POA: Diagnosis not present

## 2017-06-04 DIAGNOSIS — I270 Primary pulmonary hypertension: Secondary | ICD-10-CM | POA: Diagnosis not present

## 2017-06-04 DIAGNOSIS — G4733 Obstructive sleep apnea (adult) (pediatric): Secondary | ICD-10-CM | POA: Diagnosis not present

## 2017-06-04 DIAGNOSIS — G4730 Sleep apnea, unspecified: Secondary | ICD-10-CM | POA: Diagnosis not present

## 2017-06-06 ENCOUNTER — Ambulatory Visit (INDEPENDENT_AMBULATORY_CARE_PROVIDER_SITE_OTHER): Payer: Medicaid Other | Admitting: Internal Medicine

## 2017-06-06 ENCOUNTER — Encounter: Payer: Self-pay | Admitting: Internal Medicine

## 2017-06-06 ENCOUNTER — Other Ambulatory Visit: Payer: Self-pay

## 2017-06-06 VITALS — BP 123/68 | HR 91 | Temp 98.1°F | Ht 64.0 in | Wt 282.9 lb

## 2017-06-06 DIAGNOSIS — Z6841 Body Mass Index (BMI) 40.0 and over, adult: Secondary | ICD-10-CM | POA: Diagnosis not present

## 2017-06-06 DIAGNOSIS — I50810 Right heart failure, unspecified: Secondary | ICD-10-CM | POA: Diagnosis present

## 2017-06-06 DIAGNOSIS — E669 Obesity, unspecified: Secondary | ICD-10-CM

## 2017-06-06 DIAGNOSIS — Z7984 Long term (current) use of oral hypoglycemic drugs: Secondary | ICD-10-CM | POA: Diagnosis not present

## 2017-06-06 DIAGNOSIS — E1151 Type 2 diabetes mellitus with diabetic peripheral angiopathy without gangrene: Secondary | ICD-10-CM

## 2017-06-06 DIAGNOSIS — E7800 Pure hypercholesterolemia, unspecified: Secondary | ICD-10-CM

## 2017-06-06 DIAGNOSIS — M25569 Pain in unspecified knee: Secondary | ICD-10-CM | POA: Diagnosis not present

## 2017-06-06 DIAGNOSIS — E0800 Diabetes mellitus due to underlying condition with hyperosmolarity without nonketotic hyperglycemic-hyperosmolar coma (NKHHC): Secondary | ICD-10-CM | POA: Diagnosis not present

## 2017-06-06 DIAGNOSIS — Z79899 Other long term (current) drug therapy: Secondary | ICD-10-CM

## 2017-06-06 DIAGNOSIS — E785 Hyperlipidemia, unspecified: Secondary | ICD-10-CM

## 2017-06-06 LAB — POCT GLYCOSYLATED HEMOGLOBIN (HGB A1C): Hemoglobin A1C: 6 % — AB (ref 4.0–5.6)

## 2017-06-06 LAB — GLUCOSE, CAPILLARY: GLUCOSE-CAPILLARY: 90 mg/dL (ref 65–99)

## 2017-06-06 NOTE — Patient Instructions (Addendum)
Please take an extra dose of your 61m of torsemide in the evening for the next 3 days.  Please call Dr. BMilinda Caveoffice for a follow up appointment as directed.  We will switch your pravastatin 275mto atorvastatin 4061m For your knee pain you can follow up with the sports medicine clinic to have injections as you requested.  As a reminder their telephone number is [phone].  Your diabetes is doing very well continue the great work.

## 2017-06-06 NOTE — Progress Notes (Signed)
CC:  T2DM, Right sided CHF/PAH, HLD follow up  HPI:  [name] is a 57 y.o. female with PMH below, she is here to address her T2DM, and HLD We also discussed her volume status today and right sided heart failure 2/2 severe PAH.   Please see A&P for status of the patient's chronic medical conditions  Past Medical History:  Diagnosis Date  . Anemia, iron deficiency    secondary to menhorrhagia, on oral iron, also b12 def, getting monthly b12 shots  . CHF (congestive heart failure) ([place])   . Chronic cough    secondary to alleriges and post nasal drip  . Cognitive impairment   . COPD (chronic obstructive pulmonary disease) ([place])   . Cor pulmonale (HCC)    PA Peak pressure 63mHg  . Depression   . Diabetes mellitus    well controlled on metformin  . Diastolic heart failure ([place]   . GERD (gastroesophageal reflux disease)   . H/O mental retardation   . Herpes   . Hyperlipidemia   . Hypertension   . OSA (obstructive sleep apnea)    CPAP  . Pulmonary hypertension ([place]   . Renal disorder   . Restrictive lung disease    PFTs 06/2012 (FVC 54% predicted and FEV1 68% predicted w minimal bronchodilator response).  . Shortness of breath   . Venous stasis ulcer ([place]    chornic, ?followed up at wound care center, multiple courses of antibiotics in past for cellulitis, on lasix   Review of Systems:  ROS: Pulmonary: pt denies increased work of breathing, pt does not feel short of breath above her baseline,  Cardiac: pt denies palpitations, chest pain,  Abdominal: pt denies abdominal pain, nausea, vomiting, or diarrhea  Physical Exam:  Vitals:   06/06/17 1350  BP: 123/68  Pulse: 91  Temp: 98.1 F (36.7 C)  TempSrc: Oral  Weight: 282 lb 14.4 oz (128.3 kg)   Physical Exam  Constitutional: No distress.  Cardiovascular: Normal rate, regular rhythm and normal heart sounds. Exam reveals no gallop and no friction rub.  No murmur heard. Pulmonary/Chest: Breath sounds  normal. She has no wheezes. She has no rales. She exhibits no tenderness.  Pt short of breath at baseline, she appears slightly more SOB today  Abdominal: Soft. Bowel sounds are normal. She exhibits no mass. There is no tenderness. There is no rebound and no guarding.  Musculoskeletal: She exhibits edema (trace-1+ LE edema bilaterally).  Neurological: She is alert.  Skin: She is not diaphoretic.    Social History   Socioeconomic History  . Marital status: Single    Spouse name: Not on file  . Number of children: Not on file  . Years of education: Not on file  . Highest education level: Not on file  Occupational History  . Not on file  Social Needs  . Financial resource strain: Not on file  . Food insecurity:    Worry: Not on file    Inability: Not on file  . Transportation needs:    Medical: Not on file    Non-medical: Not on file  Tobacco Use  . Smoking status: Never Smoker  . Smokeless tobacco: Never Used  Substance and Sexual Activity  . Alcohol use: No    Alcohol/week: 0.0 oz  . Drug use: No  . Sexual activity: Never  Lifestyle  . Physical activity:    Days per week: Not on file    Minutes per session: Not on file  .  Stress: Not on file  Relationships  . Social connections:    Talks on phone: Not on file    Gets together: Not on file    Attends religious service: Not on file    Active member of club or organization: Not on file    Attends meetings of clubs or organizations: Not on file    Relationship status: Not on file  . Intimate partner violence:    Fear of current or ex partner: Not on file    Emotionally abused: Not on file    Physically abused: Not on file    Forced sexual activity: Not on file  Other Topics Concern  . Not on file  Social History Narrative  . Not on file    Family History  Problem Relation Age of Onset  . Mental illness Sister   . Mental retardation Brother   . Breast cancer Maternal Aunt     Assessment & Plan:   See  Encounters Tab for problem based charting.  Patient discussed with Dr. Evette Doffing

## 2017-06-07 LAB — BMP8+ANION GAP
Anion Gap: 16 mmol/L (ref 10.0–18.0)
BUN/Creatinine Ratio: 17 (ref 9–23)
BUN: 11 mg/dL (ref 6–24)
CO2: 23 mmol/L (ref 20–29)
Calcium: 8.6 mg/dL — ABNORMAL LOW (ref 8.7–10.2)
Chloride: 103 mmol/L (ref 96–106)
Creatinine, Ser: 0.65 mg/dL (ref 0.57–1.00)
GFR, EST AFRICAN AMERICAN: 115 mL/min/{1.73_m2} (ref >59)
GFR, EST NON AFRICAN AMERICAN: 100 mL/min/{1.73_m2} (ref >59)
Glucose: 81 mg/dL (ref 65–99)
POTASSIUM: 4.2 mmol/L (ref 3.5–5.2)
Sodium: 142 mmol/L (ref 134–144)

## 2017-06-08 NOTE — Assessment & Plan Note (Addendum)
Pt with severe R sided heart failure.  Weight up about 11lbs from about 20 days ago.  Feels as though she is gaining fluid in her abdomen.  Lungs are clear today, mild LE edema, JVD difficult to evaluate.  Subjectively not reporting feeling more SOB but appears slightly more than at prior visits.  Has follow up with [facility] later this month.  -will have pt double 60 mg torsemide dose for 3 days to 2m BID, weigh herself daily and follow up with her advanced heart failure physician -bmp today

## 2017-06-08 NOTE — Assessment & Plan Note (Signed)
Lab Results  Component Value Date   CHOL 153 11/08/2016   HDL 47 11/08/2016   LDLCALC 85 11/08/2016   TRIG 107 11/08/2016   CHOLHDL 3.3 11/08/2016   Discussed changing stating therapy today with pt given lipid panel results.  She is diabetic and at high risk for cardiac complications given her PAH and obesity.  She has just filled her prescription of pravastatin and wishes to finish this prescription.  -will write for atorvastatin 42m once pravastatin course complete later this month.

## 2017-06-08 NOTE — Progress Notes (Signed)
Internal Medicine Clinic Attending  Case discussed with Dr. Winfrey  at the time of the visit.  We reviewed the resident's history and exam and pertinent patient test results.  I agree with the assessment, diagnosis, and plan of care documented in the resident's note.  

## 2017-06-08 NOTE — Assessment & Plan Note (Signed)
Lab Results  Component Value Date   HGBA1C 6.0 (A) 06/06/2017   Pts A1C within goal today has risen only mildly.  She is currently working with our diabetes educator to adjust her diet.  She admits to not doing as well over the last month or so dietary wise.  She is currently taking 224m of metformin daily with the goal of coming off altogether.  -continue metformin 2561mdaily

## 2017-06-09 ENCOUNTER — Encounter: Payer: Self-pay | Admitting: Dietician

## 2017-06-09 ENCOUNTER — Ambulatory Visit: Payer: Medicaid Other | Admitting: Dietician

## 2017-06-09 NOTE — Patient Instructions (Signed)
Make an appointment to talk to Dr. Grace Blight about having trouble falling asleep and not sleeping well.  Try to eat foods that are baked, boiled, broiled, or grilled.   Pick out healthy foods on restaurant menus  At [facility]- grilled chicken sandwich, chicken wrap, baked potatoes and salads  Bring your food records back to group.

## 2017-06-09 NOTE — Progress Notes (Signed)
Medical Nutrition Therapy:  Appt start time: 1030 end time:  1130.  Assessment:  Primary concerns today: Weight management and Meal planning.  Meral came without oxygen today or her food records. She reports this is due to pain in her left knee- cane and oxygen was too much for her knee. She has increased her dose of torosemide per her visit with Dr. Shan Levans on Tuesday.  However, she refused being weighed today.  She says her biggest problem is not sleeping well. "Not getting enough sleep", not waking to alarm at home, fell asleep on bus coming here and didn't get off at her stop. She made this 1:1 appointment because she felt she needed more specific help in healthy meal planning. Today, she needed assistance in stating what changes she could make other than to exercise more. She requested more food records.   Learning Readiness: Not ready- may have too many competing issues  Usual eating pattern includes 1-2 meals and 1-2 snacks per day. Frequent foods and beverages include highly processed and fried foods such as [employer],  candy, cookies, fruit roll ups, fast foods, fruit, cucumbers, cabbage, potatoes beef stew, caffeinated diet sodas Goes to [facility] - had coupon buy one get one: she had double stacker meal- small fry and diet coke. She got a single o take home for supper, soda- diet Dr. Malachi Bonds Asked for help finding a different restaurant to taker her brother to that has healthier foods for same price. We looked at [facility] options and she found healhtier choices there for the same price that she would be happy with.   Usual physical activity is very limited due to her lack of sleep, pain.  Progress Towards Goal(s):  No progress.   Nutritional Diagnosis:  NB-1.3 Not ready for diet/lifestyle change  As related to competing values.  As evidenced by her report of competing values..    Intervention:  Nutrition education and counseling about how to choose healthier foods when eating out,  shopping and options for healthier snacks/ meals and cooking at home.  Handouts given during visit include: After-Visit Summary (AVS) Demonstrated degree of understanding via:  Assisted Teach Back  Barriers to learning/adherence to lifestyle change: competing values  Monitoring/Evaluation:  Dietary intake, exercise,food records, and body weight group in 3 weeks, 1:1 as needed  Debera Lat, RD 06/09/2017 1:52 PM. .

## 2017-06-23 ENCOUNTER — Ambulatory Visit: Payer: Medicaid Other | Admitting: Dietician

## 2017-06-23 ENCOUNTER — Encounter: Payer: Self-pay | Admitting: Dietician

## 2017-06-23 ENCOUNTER — Telehealth: Payer: Self-pay | Admitting: *Deleted

## 2017-06-23 DIAGNOSIS — E1142 Type 2 diabetes mellitus with diabetic polyneuropathy: Secondary | ICD-10-CM

## 2017-06-23 NOTE — Patient Instructions (Addendum)
Thank you for coming to our group diabetes meeting today.  Thank you also for completing and bringing your food records today.  I will review your food records and we can discuss at your next appointment with me.   I have scheduled a 1:1 visit with me for Tuesday, July 9 at 10:15 am.  [facility] that works for you. If not- you can call and reschedule.   [name]  [phone]

## 2017-06-23 NOTE — Progress Notes (Signed)
   Diabetes Self-Management Education  Visit Type:     Appt. Start Time: 1015 am Appt. End Time: 1115 am  06/23/2017  [name], identified by name and date of birth, is a 57 y.o. female with a diagnosis of Diabetes:  Marland Kitchen  Type 2 ASSESSMENT  Weight 279 lb 6.4 oz (126.7 kg), last menstrual period 02/19/2011. Body mass index is 47.96 kg/m.   Wt Readings from Last 3 Encounters:  06/23/17 279 lb 6.4 oz (126.7 kg)  06/06/17 282 lb 14.4 oz (128.3 kg)  05/19/17 271 lb 1.6 oz (123 kg)     Patient attended diabetes education group visit today for 60 minutes. The program included successes and challenges over the past few weeks. We discussed healthy food choices including portions sizes, reading labels, why fiber is important, meal planning using the pate method. Our activity was picking out the "healhty" foods we would buy from a grocery store flyer.   Patient is still contemplating making changes that she used before when she lost weight. Less sweets, fast food and processed snack foods as well as  less evening snacking.  She brought food records which will be reviewed with her.      Blood sugars: does not check regularly at home, Lab Results  Component Value Date   HGBA1C 6.0 (A) 06/06/2017   Medicine: on metformin, she does not "think it is working anymore"- need to discuss at a future visit  Plan: patient plans to attend group diabetes meeting in 1 month and 1:1 next week.    Learning Objective:  Patient will have a greater understanding of diabetes self-management. Patient education plan is to attend individual and/or group sessions per assessed needs and concerns.  Plan:   There are no Patient Instructions on file for this visit.   Expected Outcomes:    limited based on her history, better if she had more ongoing support  Education material provided: Support group flyer,  If problems or questions, patient to contact team via:  [employer],  RD 06/23/2017 11:57 AM.

## 2017-07-04 ENCOUNTER — Telehealth (HOSPITAL_COMMUNITY): Payer: Self-pay | Admitting: *Deleted

## 2017-07-04 ENCOUNTER — Other Ambulatory Visit: Payer: Self-pay | Admitting: Internal Medicine

## 2017-07-04 DIAGNOSIS — G4730 Sleep apnea, unspecified: Secondary | ICD-10-CM | POA: Diagnosis not present

## 2017-07-04 DIAGNOSIS — J984 Other disorders of lung: Secondary | ICD-10-CM | POA: Diagnosis not present

## 2017-07-04 DIAGNOSIS — I270 Primary pulmonary hypertension: Secondary | ICD-10-CM | POA: Diagnosis not present

## 2017-07-04 DIAGNOSIS — I5030 Unspecified diastolic (congestive) heart failure: Secondary | ICD-10-CM | POA: Diagnosis not present

## 2017-07-04 NOTE — Telephone Encounter (Signed)
Pt called c/o weight gain and SOB. She saw her pcp last week and they increased her diuretic to torsemide 27m daily alternating with 626mdaily. They advised her to be seen by our clinic this week. Patient normally sees Dr. BeHaroldine Lawsut is ok to see Amy. Appt scheduled.

## 2017-07-04 NOTE — Telephone Encounter (Signed)
MEDICARE WANTS HER TO BUY PROTONIX OVER THE COUNTER SHE WANTS TO KNOW IF THIS IS THE SAME DOSE SHE HAS BEN TAKING.

## 2017-07-05 ENCOUNTER — Encounter (HOSPITAL_COMMUNITY): Payer: Self-pay

## 2017-07-05 ENCOUNTER — Ambulatory Visit (HOSPITAL_COMMUNITY)
Admission: RE | Admit: 2017-07-05 | Discharge: 2017-07-05 | Disposition: A | Discharge status: Home / Self Care | Payer: Medicaid Other | Source: Ambulatory Visit | Attending: Internal Medicine | Admitting: Internal Medicine

## 2017-07-05 VITALS — BP 130/84 | HR 87 | Wt 276.0 lb

## 2017-07-05 DIAGNOSIS — J984 Other disorders of lung: Secondary | ICD-10-CM | POA: Insufficient documentation

## 2017-07-05 DIAGNOSIS — J449 Chronic obstructive pulmonary disease, unspecified: Secondary | ICD-10-CM | POA: Diagnosis not present

## 2017-07-05 DIAGNOSIS — I2721 Secondary pulmonary arterial hypertension: Secondary | ICD-10-CM | POA: Diagnosis not present

## 2017-07-05 DIAGNOSIS — E1122 Type 2 diabetes mellitus with diabetic chronic kidney disease: Secondary | ICD-10-CM | POA: Diagnosis not present

## 2017-07-05 DIAGNOSIS — N181 Chronic kidney disease, stage 1: Secondary | ICD-10-CM | POA: Insufficient documentation

## 2017-07-05 DIAGNOSIS — I2781 Cor pulmonale (chronic): Secondary | ICD-10-CM | POA: Insufficient documentation

## 2017-07-05 DIAGNOSIS — Z791 Long term (current) use of non-steroidal anti-inflammatories (NSAID): Secondary | ICD-10-CM | POA: Diagnosis not present

## 2017-07-05 DIAGNOSIS — Z79899 Other long term (current) drug therapy: Secondary | ICD-10-CM | POA: Insufficient documentation

## 2017-07-05 DIAGNOSIS — Z7984 Long term (current) use of oral hypoglycemic drugs: Secondary | ICD-10-CM | POA: Diagnosis not present

## 2017-07-05 DIAGNOSIS — K219 Gastro-esophageal reflux disease without esophagitis: Secondary | ICD-10-CM | POA: Insufficient documentation

## 2017-07-05 DIAGNOSIS — I272 Pulmonary hypertension, unspecified: Secondary | ICD-10-CM

## 2017-07-05 DIAGNOSIS — Z6841 Body Mass Index (BMI) 40.0 and over, adult: Secondary | ICD-10-CM | POA: Insufficient documentation

## 2017-07-05 DIAGNOSIS — G4733 Obstructive sleep apnea (adult) (pediatric): Secondary | ICD-10-CM | POA: Insufficient documentation

## 2017-07-05 DIAGNOSIS — I5032 Chronic diastolic (congestive) heart failure: Secondary | ICD-10-CM | POA: Insufficient documentation

## 2017-07-05 DIAGNOSIS — Z7901 Long term (current) use of anticoagulants: Secondary | ICD-10-CM | POA: Diagnosis not present

## 2017-07-05 DIAGNOSIS — I130 Hypertensive heart and chronic kidney disease with heart failure and stage 1 through stage 4 chronic kidney disease, or unspecified chronic kidney disease: Secondary | ICD-10-CM | POA: Diagnosis not present

## 2017-07-05 DIAGNOSIS — E785 Hyperlipidemia, unspecified: Secondary | ICD-10-CM | POA: Diagnosis not present

## 2017-07-05 DIAGNOSIS — F329 Major depressive disorder, single episode, unspecified: Secondary | ICD-10-CM | POA: Diagnosis not present

## 2017-07-05 DIAGNOSIS — I4892 Unspecified atrial flutter: Secondary | ICD-10-CM | POA: Insufficient documentation

## 2017-07-05 LAB — BASIC METABOLIC PANEL
Anion gap: 9 (ref 5–15)
BUN: 10 mg/dL (ref 6–20)
CALCIUM: 8.8 mg/dL — AB (ref 8.9–10.3)
CO2: 28 mmol/L (ref 22–32)
CREATININE: 0.66 mg/dL (ref 0.44–1.00)
Chloride: 104 mmol/L (ref 98–111)
GFR calc Af Amer: >60 mL/min (ref >60)
GFR calc non Af Amer: >60 mL/min (ref >60)
Glucose, Bld: 94 mg/dL (ref 70–99)
Potassium: 3.7 mmol/L (ref 3.5–5.1)
SODIUM: 141 mmol/L (ref 135–145)

## 2017-07-05 MED ORDER — TORSEMIDE 20 MG PO TABS: 40.0000 mg | ORAL_TABLET | Freq: Two times a day (BID) | ORAL | 2 refills | Status: DC

## 2017-07-05 NOTE — Telephone Encounter (Signed)
Pt called / informed same dose is ok per Dr Shan Levans. Stated she will talk to the pharmacist and if she has any problems , she will call back.

## 2017-07-05 NOTE — Patient Instructions (Signed)
Labs today (will call for abnormal results, otherwise no news is good news)  INCREASE Torsemide to 40 mg (2 Tablets) Twice Daily.  Follow up in 2 months with Dr. Haroldine Laws.

## 2017-07-05 NOTE — Telephone Encounter (Signed)
The same dose would be fine, if she can not afford to buy over the counter please let us know and we will help her out.

## 2017-07-05 NOTE — Progress Notes (Signed)
[name], female   DOB: 1960/06/19, 57 y.o.   MRN: [id number]    [name], female   DOB: [birth year], 57 y.o.    [facility] Cardiologist: Dr Kelby Aline    HPI: [name] is a 57 y.o. 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   She was admitted to [facility] in July 2014 with respiratory failure and RHC showed severe PAH with pulmonary pressures in the 90s. She was transferred to [facility] where she was started on IV Remodulin with excellent results.  She was admitted again 08/16/12 for CP and tachycardia, was found to be in atypical flutter vs atrial tach. Amiodarone was considered but she converted spontaneously. She was started on Xarelto. Her discharge weight was 215 lbs.    Had CP in 11/15 and Myoview with EF 78% with reversible defect in distal anterior wall and apex. (ischemia vs breast attenuation) EF 78%  Today she returns for HF follow up. Last seen at [facility] clinic on 06/28/17 and her torsemide was increased. . At that time diuretics increased due to volume overload. Overall feeling fine. Denies PND/Orthopnea. SOB with exertion. Denies presyncope/syncope.  Eating high pringles, hot dogs, cheese burgers, and [place] subs.  Appetite ok. No fever or chills. Weight at home has been 280-283 pounds. Taking all medications.  Studies: RHC 1/18 RA = 7 RV = 72/9 PA = 70/31 (47) PCW = 11 Fick cardiac output/index = 7.4/3.3 PVR = 4.9 WU FA sat = 90% PA sat = 69%, 68%  Cath 2/16: Minimal CAD LAD 30% otherwise normal RHC on Remodulin RA = 8 RV = 45/5/8 PA = 52/24 (34) PCW = 11 Fick cardiac output/index = 7.6/3.6 PVR = 3.0 WU FA sat = 99% PA sat = 70%, 71%  ECHO 08/11/12 EF 60-65% RV moderately dilated Peak PA  pressure 48 mmhg  ECHO 02/28/13 EF 55-60% Grade I DD RV severely dilated and HK. Trivial TR. Septum flat Peak PA pressure 53 mmhg ECHO 11/15 EF 55-60% RV normal Trivial TR ECHO 1/16 at [facility] EF >55% RV mildly dilated (improved function) Trivial TR normal IVC ECHO 11/2014: EF 55-60%.  RV modrately dilated. Peak PA pressure 78 mm hg ECHO 03/2015: At [facility] RV severely dilated. EF >55% RVSP 74mHG  [facility] Room air sat was 96% sats fell briefly to 89 at the very end of the walk she went 341 m this is 82% predicted  6MWD 9/17 was 362 m  This is 88% predicted on 3 L oxygen saturations did not fall below 93%.   6MWD 3/18 3470m Labs        09/29/12: K+ 3.6, Creatinine 0.87       02/12/13 K 3.4 Creatinine 0.67 ---> given extra potassium and started on 12.5 mg spironolactone.         05/17/2013  K 4.3 Creatinine 0.68        11/15  K 3.4 Cr 0.67       2/16    K 4.1 Cr 0.78       9/16:   K 3.8 Cr 1.08         02/18/2015: K 4.3 Creatinine 0.64  ROS: All systems negative except as listed in HPI, PMH and Problem List.  Past Medical History:  Diagnosis Date  . Anemia, iron deficiency    secondary to menhorrhagia, on oral iron, also b12 def, getting monthly b12 shots  . CHF (congestive heart failure) ([place])   . Chronic cough    secondary to alleriges and post nasal drip  . Cognitive impairment   . COPD (chronic obstructive pulmonary disease) ([place])   . Cor pulmonale (HCC)    PA Peak pressure 61mHg  . Depression   . Diabetes mellitus    well controlled on metformin  . Diastolic heart failure ([place]   . GERD (gastroesophageal reflux disease)   . H/O mental retardation   . Herpes   . Hyperlipidemia   . Hypertension   . OSA (obstructive sleep apnea)    CPAP  . Pulmonary hypertension ([place]   . Renal disorder   . Restrictive lung disease    PFTs 06/2012 (FVC 54% predicted and FEV1 68% predicted w minimal bronchodilator response).  . Shortness of breath   . Venous stasis  ulcer ([place]    chornic, ?followed up at wound care center, multiple courses of antibiotics in past for cellulitis, on lasix    Current Outpatient Medications  Medication Sig Dispense Refill  . acetaminophen (TYLENOL) 500 MG tablet Take 1 tablet (500 mg total) by mouth every 6 (six) hours as needed. (Patient taking differently: Take 1,000 mg by mouth every 6 (six) hours as needed for moderate pain. ) 30 tablet 0  . cetirizine (ZYRTEC) 10 MG tablet TAKE ONE TABLET BY MOUTH ONCE DAILY 30 tablet 1  . cyanocobalamin 500 MCG tablet Take 500 mcg by mouth daily.    . diphenhydrAMINE (BENADRYL) 25 mg capsule Take 25-75 mg by mouth 2 (two) times daily as needed for itching.     . ferrous sulfate 325 (65 FE) MG tablet Take 1 tablet (325 mg total) by mouth daily with breakfast. 90 tablet 3  . gabapentin (NEURONTIN) 300 MG capsule Take 2 capsules (600 mg total) by mouth 3 (three) times daily. 540 capsule 1  . glucose blood (ACCU-CHEK AVIVA PLUS) test strip Use one strip to check glucose once daily. Diagnosis code E11.42 100 each 3  . ibuprofen (ADVIL,MOTRIN) 200 MG tablet Take 600 mg by mouth every 6 (six) hours as needed for moderate pain.    . metFORMIN (GLUCOPHAGE) 500 MG tablet TAKE ONE-HALF TABLET BY MOUTH WITH BREAKFAST 90 tablet 2  . mupirocin ointment (BACTROBAN) 2 % Place 1 application into the nose 2 (two) times daily. 22 g 0  . nystatin cream (MYCOSTATIN) Apply 1 application topically 2 (two) times daily. 30 g 1  . pantoprazole (PROTONIX) 20 MG tablet Take 1 tablet (20 mg total) by mouth 2 (two) times daily. 180 tablet 1  . potassium chloride SA (K-DUR,KLOR-CON) 20 MEQ tablet TAKE 2 TABLETS BY MOUTH THREE TIMES DAILY ,  TAKE  AN  EXTRA  TABLET  WHEN  TAKING  AN  EXTRA  TORSEMIDE 180 tablet 3  . pravastatin (PRAVACHOL) 20 MG tablet Take 1 tablet (20 mg total) by mouth daily. 30 tablet 0  . rivaroxaban (XARELTO) 20 MG TABS tablet Take 1 tablet (20 mg total) by mouth daily with supper. 90 tablet 3  .  sildenafil (REVATIO) 20 MG tablet Take 1 tablet (20 mg total) by mouth 3 (three) times daily. 270 tablet 3  . sodium chloride (OCEAN) 0.65 % SOLN nasal spray Place 2 sprays  into both nostrils as needed for congestion. 30 mL 0  . spironolactone (ALDACTONE) 25 MG tablet Take 0.5 tablets (12.5 mg total) by mouth daily. 90 tablet 1  . torsemide (DEMADEX) 20 MG tablet TAKE THREE TABLETS BY MOUTH ONCE DAILY 270 tablet 2  . treprostinil (REMODULIN) 5 MG/ML SOLN injection Inject into the skin continuous.     No current facility-administered medications for this encounter.     Vitals:   07/05/17 1420  BP: 130/84  Pulse: 87  SpO2: 93%  Weight: 276 lb (125.2 kg)   Filed Weights   07/05/17 1420  Weight: 276 lb (125.2 kg)   Wt Readings from Last 3 Encounters:  07/05/17 276 lb (125.2 kg)  06/23/17 279 lb 6.4 oz (126.7 kg)  06/06/17 282 lb 14.4 oz (128.3 kg)   Physical Exam:  General:  Well appearing. No resp difficulty HEENT: normal Neck: supple. JVP ~10 . Carotids 2+ bilat; no bruits. No lymphadenopathy or thryomegaly appreciated. Cor: PMI nondisplaced. Regular rate & rhythm. No rubs, gallops. 2/6 TR  + hickman  Lungs: clear Abdomen: soft, nontender, nondistended. No hepatosplenomegaly. No bruits or masses. Good bowel sounds. Extremities: no cyanosis, clubbing, rash, R and LLE 1+ edema Neuro: alert & orientedx3, cranial nerves grossly intact. moves all 4 extremities w/o difficulty. Affect pleasant   ASSESSMENT & PLAN:  1) Chronic diastolic HF/cor pulmonale:  - NYHA IIIb. Volume status mildly elevated in the setting of high sodium diet. Despite weight gain I dont think that it is volume overload. Suspect she has gained weight due to food choices.  - Increase torsemide 40 mg twice a day.  Check BMET  - Discussed low salt food choices and limiting fluid intake to < 2 liters.     2) PAH, severe with cor pulmonale.  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 However PAH far out of proportion  to L-sided pressures or restrictive lung disease. - Follows with [facility].  -- Remains on Remodulin+ sildenafil.   3) Paroxysmal atrial flutter.  - Maintaining NSR> Continue Xarelto. 4) Obesity Body mass index is 47.38 kg/m. Discussed portion control.   Follow up in 2 months with Dr Haroldine Laws.   Darrick Grinder, NP-C  2:38 PM

## 2017-07-11 ENCOUNTER — Telehealth: Payer: Self-pay | Admitting: Dietician

## 2017-07-11 NOTE — Telephone Encounter (Signed)
Congratulated her on completing food records. Reminded her of her apopintment tomorrow.

## 2017-07-12 ENCOUNTER — Encounter: Payer: Self-pay | Admitting: Dietician

## 2017-07-12 ENCOUNTER — Ambulatory Visit: Payer: Medicaid Other | Admitting: Dietician

## 2017-07-12 NOTE — Patient Instructions (Addendum)
Writing down what we eat and drink can help [place] to see how we are spending our calories.  It may help [place] to change habits. What we eat and drink are habits formed during our lifetime. Habits are learned and can be unlearned and changed.   Please write down the foods and beverages you have during the day. Please include at least the time you eat and drink, what you eat and drink and how much you eat and drink. Includes the calories as well if you can find them on the label.  For example:  Time   What  How much   Calories 8 AM  Cooked oatmeal 1 Cup   150   Coffee   1 mug    10                         Sugar in oatmeal 1 teaspoon   16         Sugar in coffee 2 teaspoons      32   Milk in oatmeal  1/4 cup   30   Creamer in coffee  2 teaspoons      35   Margarine in oatmeal 1 teaspoon   45       Total 320

## 2017-07-12 NOTE — Progress Notes (Signed)
Diabetes Self-Management Education  Visit Type:     Appt. Start Time: 0955 am Appt. End Time: 1030 am  07/12/2017  [name], identified by name and date of birth, is a 57 y.o. female with a diagnosis of Diabetes:  Marland Kitchen  Type 2 ASSESSMENT  Weight 273 lb 6.4 oz (124 kg), last menstrual period 02/19/2011. Body mass index is 46.93 kg/m.   Wt Readings from Last 3 Encounters:  07/12/17 273 lb 6.4 oz (124 kg)  07/05/17 276 lb (125.2 kg)  06/23/17 279 lb 6.4 oz (126.7 kg)   [name] kept food records for 2 weeks over the past month. The records showed large amounts of starch, fat and salt. Today we discussed methods to adjust her food intake to assist her weight her desired weight loss. She has already make some modifications to include eating at home more and eating more vegetables and fruits.    She is contemplating group [facility] at [facility]   Blood sugars: does not check regularly at home Lab Results  Component Value Date   HGBA1C 6.0 (A) 06/06/2017   Medicine: on metformin  Plan: patient plans to keep food records for the next 2 weeks, attend group diabetes meeting in 2 weeks 1:1 2 weeks after that.     Learning Objective:  Patient will have a greater understanding of diabetes self-management. Patient education plan is to attend individual and/or group sessions per assessed needs and concerns.  Plan:   Patient Instructions   Writing down what we eat and drink can help [place] to see how we are spending our calories.  It may help [place] to change habits. What we eat and drink are habits formed during our lifetime. Habits are learned and can be unlearned and changed.   Please write down the foods and beverages you have during the day. Please include at least the time you eat and drink, what you eat and drink and how much you eat and drink. Includes the calories as well if you can find them on the label.  For example:  Time   What  How much   Calories 8  AM  Cooked oatmeal 1 Cup   150   Coffee   1 mug    10                         Sugar in oatmeal 1 teaspoon   16         Sugar in coffee 2 teaspoons      32   Milk in oatmeal  1/4 cup   30   Creamer in coffee  2 teaspoons      35   Margarine in oatmeal 1 teaspoon   45       Total 320    Expected Outcomes:    limited, better if she had more ongoing support  Education material provided: Support group flyer,  If problems or questions, patient to contact team via:  W. R. Berkley, RD 07/12/2017 11:55 AM.

## 2017-07-21 ENCOUNTER — Encounter: Payer: Self-pay | Admitting: Dietician

## 2017-07-21 ENCOUNTER — Ambulatory Visit: Payer: Medicaid Other | Admitting: Dietician

## 2017-07-21 DIAGNOSIS — E1142 Type 2 diabetes mellitus with diabetic polyneuropathy: Secondary | ICD-10-CM

## 2017-07-21 NOTE — Patient Instructions (Signed)
[name],  I scheduled a visit with me for August 1 at 10:15 AM so we can look at your food records.   Please call me if this will not work for you. I want to be sure you get the time and care you need.   [name] [phone]

## 2017-07-21 NOTE — Progress Notes (Signed)
Documentation: Patient attended diabetes education group visit today for 60 minutes. The program included successes and challenges over the past few weeks. We discussed healthy food choices including portions sizes, reading labels, why fiber is important. Our activity was making chia seed pudding.  She has had not change in her weight. She did not bring food records.   Follow up is scheduled in 1 month for diabetes group meeting.  Debera Lat, RD 07/21/2017 11:58 AM.

## 2017-08-04 ENCOUNTER — Encounter: Payer: Self-pay | Admitting: Dietician

## 2017-08-04 ENCOUNTER — Ambulatory Visit: Payer: Medicaid Other | Admitting: Dietician

## 2017-08-04 DIAGNOSIS — E1142 Type 2 diabetes mellitus with diabetic polyneuropathy: Secondary | ICD-10-CM

## 2017-08-04 DIAGNOSIS — J984 Other disorders of lung: Secondary | ICD-10-CM | POA: Diagnosis not present

## 2017-08-04 DIAGNOSIS — G4730 Sleep apnea, unspecified: Secondary | ICD-10-CM | POA: Diagnosis not present

## 2017-08-04 DIAGNOSIS — I270 Primary pulmonary hypertension: Secondary | ICD-10-CM | POA: Diagnosis not present

## 2017-08-04 DIAGNOSIS — I5030 Unspecified diastolic (congestive) heart failure: Secondary | ICD-10-CM | POA: Diagnosis not present

## 2017-08-04 NOTE — Patient Instructions (Addendum)
You said drinking diet caffeine free drink may help you get better sleep:  Ones you can try are:  Diet juices with 5 calories per servings- see pictures  Sleep Hygiene  The most common cause of insomnia is a change in your daily routine. For example, traveling, change in work hours, disruption of other behaviors (eating, exercise, leisure, etc.), and relationship conflicts can all cause sleep problems. Paying attention to good sleep hygiene is the most i mportant thing you can do to maintain good sleep.     Do:   1. Go to bed at the same time each day.   2. Get up from bed at the same time each day.   3. Get regular exercise each day, preferably in the morning. There is good evidence that regular exercise improves restful sleep. This includes stretching and aerobic exercise.   4. Get regular exposure to outdoor or bright lights, especially in the late afternoon.   5. Keep the temperature in your bedroom comfortable.   6. Keep the bedroom quiet when sleeping.   7. Keep the bedroom dark enough to facilitate sleep.   8. Use your bed only for sleep and sex.   9. Take medications as directed. It is helpful to take prescribed sleeping pills 1 hour before bedtime, so they are causing drowsiness when you lie down, or 10 hours before getting up, to avoid daytime drowsiness.   10. Use a relaxation exercise just before going to sleep.    o  Muscle relaxation, imagery, massage, warm bath, etc.   11. Keep your feet and hands warm. Wear warm socks and/or mittens or gloves to bed.   Don't:   1. Exercise just before going to bed.   2. Engage in stimulating activity just before bed, such as playing a competitive game, watching an exciting program on television or movie, or having an important discussion with a loved one.   3. Have caffeine in the evening (coffee, many teas, chocolate, sodas, etc.)   4. Read or watch television in bed.   5. Use alcohol to help you sleep.   6. Go to bed too hungry or too  full.   7. Take another person's sleeping pills.   8. Take over-the-counter sleeping pills, without your doctor's knowledge. Tolerance can develop rapidly with these medications. Diphenhydramine (an ingredient commonly found in over-the-counter sleep meds) can have serious side effects for elderly patients.   9. Take daytime naps.   10. Command yourself to go to sleep. This only makes your mind and body more alert.   If you lie in bed awake for more than 20-30 minutes, get up, go to a different room (or different part of the bedroom), participate in a quiet activity (e.g. non-excitable reading or television), and then return to bed when you feel sleepy. Do this as many times during the night as needed.

## 2017-08-04 NOTE — Progress Notes (Signed)
Diabetes Self-Management Education  Visit Type: Ongoing support  Appt. Start Time: 0955 am      Appt. End Time: 1030 am  07/12/2017  [name], identified by name and date of birth, is a 57 y.o. female with a diagnosis of Diabetes:  Marland Kitchen  Type 2. She desires weight loss and brings up that she thinks that if she got better quality and quantity of sleep that she might do better. She describes poor sleep hygiene with naps frequently during the daytime.   ASSESSMENT  Estimated body mass index is 47.36 kg/m as calculated from the following:   Height as of 06/06/17: _0  (1.626 m).   Weight as of this encounter: 275 lb 14.4 oz (125.1 kg).  Wt Readings from Last 5 Encounters:  08/04/17 275 lb 14.4 oz (125.1 kg)  07/21/17 276 lb 1.6 oz (125.2 kg)  07/12/17 273 lb 6.4 oz (124 kg)  07/05/17 276 lb (125.2 kg)  06/23/17 279 lb 6.4 oz (126.7 kg)   [name] brought food records for 2 weeks. The records showed more fruit, less sweets, mostly starch and protein type foods with few vegetables.   She is contemplating how to work in more activity.   Blood sugars:does not check regularly at home [facility]       Lab Results  Component Value Date   HGBA1C 6.0 (A) 06/06/2017     Medicine: on metformin  Intervention: Discussed good sleep hygiene and small changes she could do  Plan:  patient plans to purchase caffeine free diet drinks this month to drink later in the day instead of what she currently drinks that contains caffeine. She also agreed to keep food records for the next 2 weeks, attend group diabetes meeting in 2 weeks 1:1 2 weeks after that.  Debera Lat, RD 08/04/2017 1:39 PM.

## 2017-08-15 ENCOUNTER — Telehealth: Payer: Self-pay | Admitting: Dietician

## 2017-08-15 NOTE — Telephone Encounter (Signed)
Follow up from recent visit: [name] soda and feels it is helping her to sleep better. She is still having problems with not eating sweets, and getting more physical activity. She agrees to attend diabetes group meeting this Thursday

## 2017-08-18 ENCOUNTER — Ambulatory Visit: Payer: Self-pay | Admitting: Dietician

## 2017-09-04 DIAGNOSIS — I270 Primary pulmonary hypertension: Secondary | ICD-10-CM | POA: Diagnosis not present

## 2017-09-04 DIAGNOSIS — J984 Other disorders of lung: Secondary | ICD-10-CM | POA: Diagnosis not present

## 2017-09-04 DIAGNOSIS — G4730 Sleep apnea, unspecified: Secondary | ICD-10-CM | POA: Diagnosis not present

## 2017-09-04 DIAGNOSIS — I5030 Unspecified diastolic (congestive) heart failure: Secondary | ICD-10-CM | POA: Diagnosis not present

## 2017-09-10 ENCOUNTER — Other Ambulatory Visit: Payer: Self-pay | Admitting: Internal Medicine

## 2017-09-10 DIAGNOSIS — E7800 Pure hypercholesterolemia, unspecified: Secondary | ICD-10-CM

## 2017-09-14 ENCOUNTER — Other Ambulatory Visit: Payer: Self-pay

## 2017-09-14 ENCOUNTER — Ambulatory Visit (HOSPITAL_COMMUNITY)
Admission: RE | Admit: 2017-09-14 | Discharge: 2017-09-14 | Disposition: A | Discharge status: Home / Self Care | Payer: Medicaid Other | Source: Ambulatory Visit | Attending: Internal Medicine | Admitting: Internal Medicine

## 2017-09-14 VITALS — BP 137/82 | HR 85 | Wt 281.0 lb

## 2017-09-14 DIAGNOSIS — J449 Chronic obstructive pulmonary disease, unspecified: Secondary | ICD-10-CM | POA: Diagnosis not present

## 2017-09-14 DIAGNOSIS — Z7984 Long term (current) use of oral hypoglycemic drugs: Secondary | ICD-10-CM | POA: Insufficient documentation

## 2017-09-14 DIAGNOSIS — J984 Other disorders of lung: Secondary | ICD-10-CM | POA: Insufficient documentation

## 2017-09-14 DIAGNOSIS — Z7901 Long term (current) use of anticoagulants: Secondary | ICD-10-CM | POA: Diagnosis not present

## 2017-09-14 DIAGNOSIS — F79 Unspecified intellectual disabilities: Secondary | ICD-10-CM | POA: Insufficient documentation

## 2017-09-14 DIAGNOSIS — E785 Hyperlipidemia, unspecified: Secondary | ICD-10-CM | POA: Diagnosis not present

## 2017-09-14 DIAGNOSIS — Z79899 Other long term (current) drug therapy: Secondary | ICD-10-CM | POA: Insufficient documentation

## 2017-09-14 DIAGNOSIS — I5032 Chronic diastolic (congestive) heart failure: Secondary | ICD-10-CM | POA: Diagnosis not present

## 2017-09-14 DIAGNOSIS — I110 Hypertensive heart disease with heart failure: Secondary | ICD-10-CM | POA: Diagnosis not present

## 2017-09-14 DIAGNOSIS — F329 Major depressive disorder, single episode, unspecified: Secondary | ICD-10-CM | POA: Insufficient documentation

## 2017-09-14 DIAGNOSIS — I272 Pulmonary hypertension, unspecified: Secondary | ICD-10-CM

## 2017-09-14 DIAGNOSIS — D509 Iron deficiency anemia, unspecified: Secondary | ICD-10-CM | POA: Diagnosis not present

## 2017-09-14 DIAGNOSIS — G4733 Obstructive sleep apnea (adult) (pediatric): Secondary | ICD-10-CM | POA: Diagnosis not present

## 2017-09-14 DIAGNOSIS — I4892 Unspecified atrial flutter: Secondary | ICD-10-CM | POA: Diagnosis not present

## 2017-09-14 DIAGNOSIS — E119 Type 2 diabetes mellitus without complications: Secondary | ICD-10-CM | POA: Insufficient documentation

## 2017-09-14 DIAGNOSIS — K219 Gastro-esophageal reflux disease without esophagitis: Secondary | ICD-10-CM | POA: Diagnosis not present

## 2017-09-14 DIAGNOSIS — I2721 Secondary pulmonary arterial hypertension: Secondary | ICD-10-CM | POA: Diagnosis not present

## 2017-09-14 DIAGNOSIS — Z6841 Body Mass Index (BMI) 40.0 and over, adult: Secondary | ICD-10-CM | POA: Diagnosis not present

## 2017-09-14 DIAGNOSIS — I2781 Cor pulmonale (chronic): Secondary | ICD-10-CM | POA: Diagnosis not present

## 2017-09-14 NOTE — Patient Instructions (Signed)
We will contact you in 6 months to schedule your next appointment.

## 2017-09-14 NOTE — Progress Notes (Signed)
[name], female   DOB: May 21, 1960, 57 y.o.   MRN: [id number]    [name], female   DOB: 1960-08-05, 57 y.o.    [facility] Cardiologist: Dr Kelby Aline    HPI: [name] is a 57 y.o. 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   She was admitted to [facility] in July 2014 with respiratory failure and RHC showed severe PAH with pulmonary pressures in the 90s. She was transferred to [facility] where she was started on IV Remodulin with excellent results.  She was admitted again 08/16/12 for CP and tachycardia, was found to be in atypical flutter vs atrial tach. Amiodarone was considered but she converted spontaneously. She was started on Xarelto. Her discharge weight was 215 lbs.    Had CP in 11/15 and Myoview with EF 78% with reversible defect in distal anterior wall and apex. (ischemia vs breast attenuation) EF 78%  She returns today for HF follow up. Saw Dr. Gilles Chiquito in the [facility] in 6/19 and was doing fairly well although she had gained some weight due to poor diet. Remodulin increased slightly. Last visit here torsemide was increased. Overall doing better with increased torsemide. Remains mildly SOB with exertion. None with ADLs. Denies edema or PND. Sleeps on 3 pillows at baseline. Wearing CPAP qHS. Wearing 3L O2 only with activity. Occasionally dizzy when she gets SOB. She has had two episodes of presyncope when off O2. Denies bleeding on Xarelto. Weight up 5 lbs. Weights ~278 lbs at home. Continues to eat high salt foods like pringles and hot dogs. Also eats cakes and cookies.  Seeing a dietician. Has not taken meds yet today. Sometimes misses afternoon doses. Did not wear O2 into clinic. Says she is now mixing up her own  Remodulin cartridges because her son was not as reliable as she wanted.  Studies: RHC 1/18 RA = 7 RV = 72/9 PA = 70/31 (47) PCW = 11 Fick cardiac output/index = 7.4/3.3 PVR = 4.9 WU FA sat = 90% PA sat = 69%, 68%  Cath 2/16: Minimal CAD LAD 30% otherwise normal RHC on Remodulin RA = 8 RV = 45/5/8 PA = 52/24 (34) PCW = 11 Fick cardiac output/index = 7.6/3.6 PVR = 3.0 WU FA sat = 99% PA sat = 70%, 71%  ECHO 08/11/12 EF 60-65% RV moderately dilated Peak PA pressure 48 mmhg  ECHO 02/28/13 EF 55-60% Grade I DD RV severely dilated and HK. Trivial TR. Septum flat Peak PA pressure 53 mmhg ECHO 11/15 EF 55-60% RV normal Trivial TR ECHO 1/16 at [facility] EF >55% RV mildly dilated (improved function) Trivial TR normal IVC ECHO 11/2014: EF 55-60%.  RV modrately dilated. Peak PA pressure 78 mm hg ECHO 03/2015: At [facility] RV severely dilated. EF >55% RVSP 26mHG [facility]  [facility] Room air sat was 96% sats fell briefly to 89 at the very end of the walk she went 341 m this is 82% predicted  6MWD 9/17 was 362 m  This is 88% predicted on 3 L oxygen saturations did not fall below 93%.   6MWD 3/18 3415m Labs        09/29/12: K+ 3.6, Creatinine 0.87  02/12/13 K 3.4 Creatinine 0.67 ---> given extra potassium and started on 12.5 mg spironolactone.         05/17/2013  K 4.3 Creatinine 0.68        11/15  K 3.4 Cr 0.67       2/16    K 4.1 Cr 0.78       9/16:   K 3.8 Cr 1.08         02/18/2015: K 4.3 Creatinine 0.64            Review of systems complete and found to be negative unless listed in HPI.   Past Medical History:  Diagnosis Date  . Anemia, iron deficiency    secondary to menhorrhagia, on oral iron, also b12 def, getting monthly b12 shots  . CHF (congestive heart failure) ([place])   . Chronic cough    secondary to alleriges and post nasal drip  . Cognitive impairment   . COPD (chronic obstructive pulmonary disease) ([place])   . Cor pulmonale  ([facility])    PA Peak pressure 41mHg  . Depression   . Diabetes mellitus    well controlled on metformin  . Diastolic heart failure ([place]   . GERD (gastroesophageal reflux disease)   . H/O mental retardation   . Herpes   . Hyperlipidemia   . Hypertension   . OSA (obstructive sleep apnea)    CPAP  . Pulmonary hypertension ([place]   . Renal disorder   . Restrictive lung disease    PFTs 06/2012 (FVC 54% predicted and FEV1 68% predicted w minimal bronchodilator response).  . Shortness of breath   . 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 acetaminophen (TYLENOL) 500 MG tablet Take 1 tablet (500 mg total) by mouth every 6 (six) hours as needed. 30 tablet 0  . cetirizine (ZYRTEC) 10 MG tablet TAKE ONE TABLET BY MOUTH ONCE DAILY 30 tablet 1  . cyanocobalamin 500 MCG tablet Take 500 mcg by mouth daily.    . diphenhydrAMINE (BENADRYL) 25 mg capsule Take 25-75 mg by mouth 2 (two) times daily as needed for itching.     . ferrous sulfate 325 (65 FE) MG tablet Take 1 tablet (325 mg total) by mouth daily with breakfast. 90 tablet 3  . gabapentin (NEURONTIN) 300 MG capsule Take 2 capsules (600 mg total) by mouth 3 (three) times daily. 540 capsule 1  . glucose blood (ACCU-CHEK AVIVA PLUS) test strip Use one strip to check glucose once daily. Diagnosis code E11.42 100 each 3  . ibuprofen (ADVIL,MOTRIN) 200 MG tablet Take 600 mg by mouth every 6 (six) hours as needed for moderate pain.    . metFORMIN (GLUCOPHAGE) 500 MG tablet TAKE ONE-HALF TABLET BY MOUTH WITH BREAKFAST 90 tablet 2  . mupirocin ointment (BACTROBAN) 2 % Place 1 application into the nose 2 (two) times daily. 22 g 0  . nystatin cream (MYCOSTATIN) Apply 1 application topically 2 (two) times daily. 30 g 1  . pantoprazole (PROTONIX) 20 MG tablet Take 1 tablet (20 mg total) by  mouth 2 (two) times daily. 180 tablet 1  . potassium chloride SA (K-DUR,KLOR-CON) 20 MEQ tablet TAKE 2 TABLETS BY MOUTH THREE TIMES DAILY ,  TAKE  AN  EXTRA  TABLET  WHEN  TAKING  AN  EXTRA  TORSEMIDE 180 tablet 3  .  pravastatin (PRAVACHOL) 20 MG tablet TAKE 1 TABLET BY MOUTH ONCE DAILY 30 tablet 0  . rivaroxaban (XARELTO) 20 MG TABS tablet Take 1 tablet (20 mg total) by mouth daily with supper. 90 tablet 3  . sildenafil (REVATIO) 20 MG tablet Take 1 tablet (20 mg total) by mouth 3 (three) times daily. 270 tablet 3  . sodium chloride (OCEAN) 0.65 % SOLN nasal spray Place 2 sprays into both nostrils as needed for congestion. 30 mL 0  . spironolactone (ALDACTONE) 25 MG tablet Take 0.5 tablets (12.5 mg total) by mouth daily. 90 tablet 1  . torsemide (DEMADEX) 20 MG tablet Take 2 tablets (40 mg total) by mouth 2 (two) times daily. 360 tablet 2  . treprostinil (REMODULIN) 5 MG/ML SOLN injection Inject into the skin continuous.     No current facility-administered medications for this encounter.     Vitals:   09/14/17 1339  BP: 137/82  Pulse: 85  SpO2: 94%  Weight: 127.5 kg (281 lb)   Filed Weights   09/14/17 1339  Weight: 127.5 kg (281 lb)   Wt Readings from Last 3 Encounters:  09/14/17 127.5 kg (281 lb)  08/04/17 125.1 kg (275 lb 14.4 oz)  07/21/17 125.2 kg (276 lb 1.6 oz)   Physical Exam:  General:Obese  No resp difficulty. HEENT: Normal x for flushing Neck: Supple. JVP hard to see.Looks ok . Carotids 2+ bilat; no bruits. No thyromegaly or nodule noted. Cor: PMI nondisplaced. RRR. + prominent P2  + R chest [place] (site ok) Lungs: CTAB, normal effort. occ wheeze. On 3 L [place] Abdomen: Obese Soft, non-tender, non-distended, no HSM. No bruits or masses. +BS  Extremities: no cyanosis, clubbing, rash, edema Neuro: alert & oriented x 3, cranial nerves grossly intact. moves all 4 extremities w/o difficulty. Affect pleasan   ASSESSMENT & PLAN:  1) Chronic diastolic HF/cor pulmonale:    - NYHA III. Volume status stable on exam despite 5 lb weight gain - Continue torsemide 40 mg twice a day.  - Continue spiro 12.5 mg daily - Discussed low salt food choices and limiting fluid intake to < 2 liters.   2) PAH, severe with cor pulmonale.  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 However PAH far out of proportion to L-sided pressures or restrictive lung disease. - Follows with [facility].  - Remains on Remodulin + sildenafil.   3) Paroxysmal atrial flutter.  - Continue Xarelto. Denies bleeding. 4) Obesity Body mass index is 48.23 kg/m. - Discussed portion control. Seeing a dietician  Georgiana Shore, NP-C  2:44 PM  Patient seen and examined with the above-signed Advanced Practice Provider and/or Housestaff. I personally reviewed laboratory data, imaging studies and relevant notes. I independently examined the patient and formulated the important aspects of the plan. I have edited the note to reflect any of my changes or salient points. I have personally discussed the plan with the patient and/or family.  Overall stable NYHA III on remodulin and sildenafil. [facility] Most recent [place] 1/18 with moderate PAH and normal output. Symptomatically stable. Will consider repeating RHC in next 6 months. If pressures going up or output down can consider adding ERA. Stressed need to watch her diet more closely and be more active.   Glori Bickers, MD  11:25 PM

## 2017-09-22 ENCOUNTER — Ambulatory Visit: Payer: Medicaid Other | Admitting: Dietician

## 2017-09-22 DIAGNOSIS — E1142 Type 2 diabetes mellitus with diabetic polyneuropathy: Secondary | ICD-10-CM

## 2017-09-22 NOTE — Progress Notes (Signed)
Diabetes Group meeting Documentation:  start time:10:15 AM   end time: [place] attended diabetes education group visit today for 60 minutes. The program included successes and challenges over the past few weeks. We discussed healthy food choices including  A quick food demo/easy healthy recipe for 4 ingredient oatmeal/peanut butter cookies. Our activity was using the diabetes conversation map to discuss on healthy eating for diabtes  Blood sugar: not done today Weight not done today per patient request  The goal she/he is working on DH:WYSHUOH healthy , low sodium and eating less sweets.   Plan: attend 1:1 vs diabetes group meeting next month Debera Lat, RD 09/22/2017 12:16 PM.

## 2017-09-23 ENCOUNTER — Other Ambulatory Visit: Payer: Self-pay | Admitting: Internal Medicine

## 2017-09-26 NOTE — Telephone Encounter (Signed)
refilled 

## 2017-09-29 ENCOUNTER — Emergency Department (HOSPITAL_COMMUNITY)
Admission: EM | Admit: 2017-09-29 | Discharge: 2017-09-30 | Disposition: A | Discharge status: Home / Self Care | Payer: Medicaid Other | Attending: Emergency Medicine | Admitting: Emergency Medicine

## 2017-09-29 ENCOUNTER — Emergency Department (HOSPITAL_COMMUNITY): Payer: Medicaid Other

## 2017-09-29 ENCOUNTER — Encounter (HOSPITAL_COMMUNITY): Payer: Self-pay

## 2017-09-29 ENCOUNTER — Other Ambulatory Visit: Payer: Self-pay

## 2017-09-29 DIAGNOSIS — Z7984 Long term (current) use of oral hypoglycemic drugs: Secondary | ICD-10-CM | POA: Insufficient documentation

## 2017-09-29 DIAGNOSIS — Y939 Activity, unspecified: Secondary | ICD-10-CM | POA: Diagnosis not present

## 2017-09-29 DIAGNOSIS — E119 Type 2 diabetes mellitus without complications: Secondary | ICD-10-CM | POA: Insufficient documentation

## 2017-09-29 DIAGNOSIS — Y998 Other external cause status: Secondary | ICD-10-CM | POA: Diagnosis not present

## 2017-09-29 DIAGNOSIS — S59902A Unspecified injury of left elbow, initial encounter: Secondary | ICD-10-CM | POA: Diagnosis not present

## 2017-09-29 DIAGNOSIS — I5032 Chronic diastolic (congestive) heart failure: Secondary | ICD-10-CM | POA: Insufficient documentation

## 2017-09-29 DIAGNOSIS — I110 Hypertensive heart disease with heart failure: Secondary | ICD-10-CM | POA: Insufficient documentation

## 2017-09-29 DIAGNOSIS — I10 Essential (primary) hypertension: Secondary | ICD-10-CM | POA: Diagnosis not present

## 2017-09-29 DIAGNOSIS — X58XXXA Exposure to other specified factors, initial encounter: Secondary | ICD-10-CM | POA: Diagnosis not present

## 2017-09-29 DIAGNOSIS — S40022A Contusion of left upper arm, initial encounter: Secondary | ICD-10-CM

## 2017-09-29 DIAGNOSIS — J449 Chronic obstructive pulmonary disease, unspecified: Secondary | ICD-10-CM | POA: Diagnosis not present

## 2017-09-29 DIAGNOSIS — Z91040 Latex allergy status: Secondary | ICD-10-CM | POA: Insufficient documentation

## 2017-09-29 DIAGNOSIS — Z9049 Acquired absence of other specified parts of digestive tract: Secondary | ICD-10-CM | POA: Insufficient documentation

## 2017-09-29 DIAGNOSIS — F329 Major depressive disorder, single episode, unspecified: Secondary | ICD-10-CM | POA: Insufficient documentation

## 2017-09-29 DIAGNOSIS — Z79899 Other long term (current) drug therapy: Secondary | ICD-10-CM | POA: Insufficient documentation

## 2017-09-29 DIAGNOSIS — R52 Pain, unspecified: Secondary | ICD-10-CM | POA: Diagnosis not present

## 2017-09-29 DIAGNOSIS — S5012XA Contusion of left forearm, initial encounter: Secondary | ICD-10-CM | POA: Insufficient documentation

## 2017-09-29 DIAGNOSIS — Y929 Unspecified place or not applicable: Secondary | ICD-10-CM | POA: Insufficient documentation

## 2017-09-29 DIAGNOSIS — M7989 Other specified soft tissue disorders: Secondary | ICD-10-CM | POA: Diagnosis not present

## 2017-09-29 NOTE — ED Triage Notes (Signed)
Pt BIB [facility] for eval of L arm pain. Pt reports that she was washing a bowl and noted an area of swelling and pain to L elbow. Pt is anticoagulated, denies injury to trauma to L elbow. +CMS to arm/hand distal to hematoma

## 2017-09-29 NOTE — ED Provider Notes (Signed)
[facility] EMERGENCY DEPARTMENT Provider Note   CSN: [id number] Arrival date & time: 09/29/17  1955     History   Chief Complaint Chief Complaint  Patient presents with  . Arm Pain    HPI [name] is a 57 y.o. female.  HPI   Patient is a 57 year old female with PMHx of morbid obesity, cognitive impairment, DM, HFpEF, paroxysmal A. fib (on Xarelto), and cor pulmonale severe PAH who presents with left elbow hematoma that she noticed this evening while doing dishes.  She noted tingling at area of swelling.  She is concerned as she is on anticoagulation.  Does not recall injury or trauma.  Does not feel like area is expanding.  She is able to fully range her entire left arm.  Otherwise at baseline state of health.   Past Medical History:  Diagnosis Date  . Anemia, iron deficiency    secondary to menhorrhagia, on oral iron, also b12 def, getting monthly b12 shots  . CHF (congestive heart failure) ([place])   . Chronic cough    secondary to alleriges and post nasal drip  . Cognitive impairment   . COPD (chronic obstructive pulmonary disease) ([place])   . Cor pulmonale (HCC)    PA Peak pressure 40mHg  . Depression   . Diabetes mellitus    well controlled on metformin  . Diastolic heart failure ([place]   . GERD (gastroesophageal reflux disease)   . H/O mental retardation   . Herpes   . Hyperlipidemia   . Hypertension   . OSA (obstructive sleep apnea)    CPAP  . Pulmonary hypertension ([place]   . Renal disorder   . Restrictive lung disease    PFTs 06/2012 (FVC 54% predicted and FEV1 68% predicted w minimal bronchodilator response).  . Shortness of breath   . Venous stasis ulcer ([place]    chornic, ?followed up at wound care center, multiple courses of antibiotics in past for cellulitis, on lasix    Patient Active Problem List   Diagnosis Date Noted  . Ovarian cyst 05/19/2017  . Lower extremity edema 02/25/2017  . CAP (community acquired pneumonia)  02/10/2016  . Postmenopausal vaginal bleeding 03/21/2015  . Rash 10/31/2014  . Peripheral neuropathy ([place] 09/27/2014  . Healthcare maintenance 09/27/2014  . Chronic diastolic CHF (congestive heart failure) ([place] 06/25/2013  . Pulmonary HTN (HM[place] 09/02/2012  . Atrial flutter (HW[place] 08/17/2012  . Pulmonary hypertension (HS[place] 08/16/2012  . Right heart failure, NYHA class 3 (HR[place] 03/15/2012  . Allergic rhinitis 11/16/2011  . Diabetes (HL[place] 10/15/2010  . Hyperlipidemia 10/15/2010  . Obesity 10/14/2010  . Obstructive sleep apnea 02/21/2009  . GERD 11/29/2005  . ANEMIA, B12 DEFICIENCY 10/19/2005  . MENTAL RETARDATION 10/19/2005    Past Surgical History:  Procedure Laterality Date  . CARDIAC CATHETERIZATION N/A 01/13/2016   Procedure: Right Heart Cath;  Surgeon: DJolaine Artist MD;  [facility];  Service: Cardiovascular;  Laterality: N/A;  . CHOLECYSTECTOMY    . LEFT AND RIGHT HEART CATHETERIZATION WITH CORONARY ANGIOGRAM N/A 02/28/2014   Procedure: LEFT AND RIGHT HEART CATHETERIZATION WITH CORONARY ANGIOGRAM;  Surgeon: DJolaine Artist MD;  [facility];  Service: Cardiovascular;  Laterality: N/A;     OB History    Gravida  1   Para  1   Term  1   Preterm      AB      Living  1     SAB  TAB      Ectopic      Multiple      Live Births               Home Medications    Prior to Admission medications   Medication Sig Start Date End Date Taking? Authorizing Provider  acetaminophen (TYLENOL) 500 MG tablet Take 1 tablet (500 mg total) by mouth every 6 (six) hours as needed. Patient taking differently: Take 500 mg by mouth every 6 (six) hours as needed for mild pain.  08/21/15  Yes [name], MD  cetirizine (ZYRTEC) 10 MG tablet TAKE ONE TABLET BY MOUTH ONCE DAILY 07/04/14  Yes Corky Sox, MD  cyanocobalamin 500 MCG tablet Take 500 mcg by mouth daily.   Yes [provider]  diphenhydrAMINE (BENADRYL) 25 mg capsule  Take 25-75 mg by mouth 2 (two) times daily as needed for itching.    Yes [provider]  ferrous sulfate 325 (65 FE) MG tablet Take 1 tablet (325 mg total) by mouth daily with breakfast. 07/23/14  Yes Corky Sox, MD  gabapentin (NEURONTIN) 300 MG capsule TAKE 2 CAPSULES BY MOUTH THREE TIMES DAILY 09/26/17  Yes [name], MD  glucose blood (ACCU-CHEK AVIVA PLUS) test strip Use one strip to check glucose once daily. Diagnosis code E11.42 09/24/16  Yes [name], MD  ibuprofen (ADVIL,MOTRIN) 200 MG tablet Take 600 mg by mouth every 6 (six) hours as needed for moderate pain.   Yes [provider]  metFORMIN (GLUCOPHAGE) 500 MG tablet TAKE ONE-HALF TABLET BY MOUTH WITH BREAKFAST 04/16/17  Yes Winfrey, Jenne Pane, MD  nystatin cream (MYCOSTATIN) Apply 1 application topically 2 (two) times daily. Patient taking differently: Apply 1 application topically 2 (two) times daily as needed for dry skin.  08/20/16  Yes Constant, Peggy, MD  omeprazole (PRILOSEC) 20 MG capsule Take 20 mg by mouth 2 (two) times daily as needed (acid reflux).   Yes [provider]  potassium chloride SA (K-DUR,KLOR-CON) 20 MEQ tablet TAKE 2 TABLETS BY MOUTH THREE TIMES DAILY ,  TAKE  AN  EXTRA  TABLET  WHEN  TAKING  AN  EXTRA  TORSEMIDE 05/17/17  Yes [name], MD  pravastatin (PRAVACHOL) 20 MG tablet TAKE 1 TABLET BY MOUTH ONCE DAILY 09/13/17  Yes Oda Kilts, MD  rivaroxaban (XARELTO) 20 MG TABS tablet Take 1 tablet (20 mg total) by mouth daily with supper. 01/13/17  Yes [name], MD  sildenafil (REVATIO) 20 MG tablet Take 1 tablet (20 mg total) by mouth 3 (three) times daily. 11/01/16  Yes Bensimhon, [name], MD  sodium chloride (OCEAN) 0.65 % SOLN nasal spray Place 2 sprays into both nostrils as needed for congestion. 02/12/16  Yes [name], MD  spironolactone (ALDACTONE) 25 MG tablet Take 0.5 tablets (12.5 mg total) by mouth daily. 04/16/17  Yes [name], MD  torsemide (DEMADEX) 20 MG tablet Take 2 tablets (40 mg total) by mouth 2 (two) times daily. 07/05/17  Yes Clegg, Amy D, NP  treprostinil (REMODULIN) 5 MG/ML SOLN injection Inject 6 ng/mL into the skin continuous.    Yes [provider]  mupirocin ointment (BACTROBAN) 2 % Place 1 application into the nose 2 (two) times daily. Patient not taking: Reported on 09/29/2017 02/25/17   Ledell Noss, MD  pantoprazole (PROTONIX) 20 MG tablet Take 1 tablet (20 mg total) by mouth 2 (two) times daily. Patient not taking: Reported on 09/29/2017 04/16/17  [name], MD    Family History Family History  Problem Relation Age of Onset  . Mental illness Sister   . Mental retardation Brother   . Breast cancer Maternal Aunt     Social History Social History   Tobacco Use  . Smoking status: Never Smoker  . Smokeless tobacco: Never Used  Substance Use Topics  . Alcohol use: No    Alcohol/week: 0.0 standard drinks  . Drug use: No     Allergies   Aspirin; Codeine; Lisinopril; Sulfonamide derivatives; and Latex   Review of Systems Review of Systems  Constitutional: Negative for chills and fever.  HENT: Negative for sore throat.   Eyes: Negative for visual disturbance.  Respiratory: Negative for shortness of breath.   Cardiovascular: Negative for chest pain and palpitations.  Gastrointestinal: Negative for abdominal pain, diarrhea and vomiting.  Genitourinary: Negative for dysuria and hematuria.  Musculoskeletal:       +Left arm swelling  Skin: Negative for wound.  Neurological: Negative for seizures and syncope.  All other systems reviewed and are negative.    Physical Exam Updated Vital Signs BP 103/76 (BP Location: Right Arm)   Pulse 81   Temp 98.8 F (37.1 C) (Oral)   Resp 18   Ht _0  (1.626 m)   Wt 127 kg   LMP 02/19/2011   SpO2 98%   BMI 48.06 kg/m   Physical Exam  Constitutional: She is oriented to person, place, and time. She appears  well-developed and well-nourished. No distress.  Morbidly obese  HENT:  Head: Normocephalic and atraumatic.  Mouth/Throat: Oropharynx is clear and moist.  Eyes: Conjunctivae are normal.  Neck: Normal range of motion. Neck supple.  Cardiovascular: Normal rate, regular rhythm and intact distal pulses.  Pulmonary/Chest: Effort normal and breath sounds normal. No respiratory distress.  On home 4L [place].  Abdominal: Soft. There is no tenderness.  Musculoskeletal:  Hematoma to left forearm, golf ball size.  Not actively expanding.  Full ROM and 5/5 strength of R wrist, elbow, and shoulder.  NVI with 2+ radial pulses. No overlying skin changes, erythema, or warmth.   Neurological: She is alert and oriented to person, place, and time.  Skin: Skin is warm and dry. Capillary refill takes less than 2 seconds. No rash noted.  Psychiatric: She has a normal mood and affect.  Nursing note and vitals reviewed.    ED Treatments / Results  Labs (all labs ordered are listed, but only abnormal results are displayed) Labs Reviewed - No data to display  EKG None  Radiology Dg Elbow 2 Views Left  Result Date: 09/29/2017 CLINICAL DATA:  Pain and swelling. EXAM: LEFT ELBOW - 2 VIEW COMPARISON:  None. FINDINGS: Enthesopathy at the triceps insertion is redemonstrated. Stigmata of medial and lateral epicondylitis is noted with faint soft tissue mineralization off the epicondyles identified. No joint effusion or fracture. No joint dislocation. Soft tissues are unremarkable. IMPRESSION: Spurring at the olecranon insertion of the triceps tendon. Stigmata of medial and lateral epicondylitis. No acute osseous abnormality. Electronically Signed   By: Ashley Royalty M.D.   On: 09/29/2017 21:10    Procedures Procedures (including critical care time)  Medications Ordered in ED Medications - No data to display   Initial Impression / Assessment and Plan / ED Course  I have reviewed the triage vital signs and the  nursing notes.  Pertinent labs & imaging results that were available during my care of the patient were reviewed by me and  considered in my medical decision making (see chart for details).    Patient is a 57 year old female with PMHx of morbid obesity, cognitive impairment, DM, HFpEF, paroxysmal A. fib (on Xarelto), and cor pulmonale severe PAH who presents with left elbow hematoma that she noticed this evening while doing dishes.  No injury/trauma. On arrival HDS.  Exam as above with small golf ball sized hematoma at left forearm that is not expanding.  NVI.  XR L elbow without fracture.  LUE was wrapped with an ACE bandage and hematoma marked.  No expansion noted throughout ED stay.  Doubt overlying acute infections process or septic joint.  Patient stable for d/c home.  Old records reviewed.  Imaging and labs reviewed and interpreted by myself and attending and used in the MDM.  Addressed patient question and concerns.  Reviewed discharged instructions with strict precautions given.  Advised patient to schedule follow-up with primary care provider.  Patient verbalized understanding and agrees with plan.  Patient stable at discharge.  The plan for this patient was discussed with Dr. Vanita Panda who voiced agreement and who oversaw evaluation and treatment of this patient.  Final Clinical Impressions(s) / ED Diagnoses   Final diagnoses:  Hematoma of arm, left, initial encounter    ED Discharge Orders    None       Fabian November, MD 09/29/17 2340    Carmin Muskrat, MD 09/30/17 [phone]

## 2017-09-29 NOTE — Discharge Instructions (Addendum)
Follow up with primary care doctor.  Keep area wrapped and take tylenol as needed for pain. Return to the ED for any worsening including increasing size of hematoma.

## 2017-10-02 ENCOUNTER — Other Ambulatory Visit: Payer: Self-pay

## 2017-10-02 ENCOUNTER — Encounter (HOSPITAL_COMMUNITY): Payer: Self-pay

## 2017-10-02 ENCOUNTER — Emergency Department (HOSPITAL_COMMUNITY)
Admission: EM | Admit: 2017-10-02 | Discharge: 2017-10-02 | Disposition: A | Discharge status: Home / Self Care | Payer: Medicaid Other | Attending: Emergency Medicine | Admitting: Emergency Medicine

## 2017-10-02 DIAGNOSIS — Y939 Activity, unspecified: Secondary | ICD-10-CM | POA: Diagnosis not present

## 2017-10-02 DIAGNOSIS — Y999 Unspecified external cause status: Secondary | ICD-10-CM | POA: Diagnosis not present

## 2017-10-02 DIAGNOSIS — E119 Type 2 diabetes mellitus without complications: Secondary | ICD-10-CM | POA: Insufficient documentation

## 2017-10-02 DIAGNOSIS — W0110XA Fall on same level from slipping, tripping and stumbling with subsequent striking against unspecified object, initial encounter: Secondary | ICD-10-CM | POA: Diagnosis not present

## 2017-10-02 DIAGNOSIS — Z7984 Long term (current) use of oral hypoglycemic drugs: Secondary | ICD-10-CM | POA: Diagnosis not present

## 2017-10-02 DIAGNOSIS — S5011XA Contusion of right forearm, initial encounter: Secondary | ICD-10-CM | POA: Insufficient documentation

## 2017-10-02 DIAGNOSIS — M7989 Other specified soft tissue disorders: Secondary | ICD-10-CM | POA: Diagnosis present

## 2017-10-02 DIAGNOSIS — S5012XA Contusion of left forearm, initial encounter: Secondary | ICD-10-CM | POA: Diagnosis not present

## 2017-10-02 DIAGNOSIS — Y929 Unspecified place or not applicable: Secondary | ICD-10-CM | POA: Diagnosis not present

## 2017-10-02 DIAGNOSIS — I5032 Chronic diastolic (congestive) heart failure: Secondary | ICD-10-CM | POA: Insufficient documentation

## 2017-10-02 DIAGNOSIS — T148XXA Other injury of unspecified body region, initial encounter: Secondary | ICD-10-CM

## 2017-10-02 DIAGNOSIS — Z91040 Latex allergy status: Secondary | ICD-10-CM | POA: Insufficient documentation

## 2017-10-02 DIAGNOSIS — Z7901 Long term (current) use of anticoagulants: Secondary | ICD-10-CM | POA: Diagnosis not present

## 2017-10-02 DIAGNOSIS — I110 Hypertensive heart disease with heart failure: Secondary | ICD-10-CM | POA: Insufficient documentation

## 2017-10-02 DIAGNOSIS — Z79899 Other long term (current) drug therapy: Secondary | ICD-10-CM | POA: Insufficient documentation

## 2017-10-02 LAB — CBC WITH DIFFERENTIAL/PLATELET
Abs Immature Granulocytes: 0.1 10*3/uL (ref 0.0–0.1)
BASOS PCT: 0 %
Basophils Absolute: 0 10*3/uL (ref 0.0–0.1)
Eosinophils Absolute: 0 10*3/uL (ref 0.0–0.7)
Eosinophils Relative: 0 %
HCT: 49 % — ABNORMAL HIGH (ref 36.0–46.0)
HEMOGLOBIN: 15.2 g/dL — AB (ref 12.0–15.0)
IMMATURE GRANULOCYTES: 1 %
LYMPHS ABS: 2.2 10*3/uL (ref 0.7–4.0)
LYMPHS PCT: 25 %
MCH: 27.8 pg (ref 26.0–34.0)
MCHC: 31 g/dL (ref 30.0–36.0)
MCV: 89.6 fL (ref 78.0–100.0)
MONO ABS: 1.1 10*3/uL — AB (ref 0.1–1.0)
MONOS PCT: 13 %
NEUTROS ABS: 5.5 10*3/uL (ref 1.7–7.7)
NEUTROS PCT: 61 %
Platelets: 366 10*3/uL (ref 150–400)
RBC: 5.47 MIL/uL — ABNORMAL HIGH (ref 3.87–5.11)
RDW: 18.6 % — ABNORMAL HIGH (ref 11.5–15.5)
WBC: 8.9 10*3/uL (ref 4.0–10.5)

## 2017-10-02 LAB — BASIC METABOLIC PANEL
ANION GAP: 8 (ref 5–15)
BUN: 8 mg/dL (ref 6–20)
CHLORIDE: 105 mmol/L (ref 98–111)
CO2: 27 mmol/L (ref 22–32)
Calcium: 8.7 mg/dL — ABNORMAL LOW (ref 8.9–10.3)
Creatinine, Ser: 0.78 mg/dL (ref 0.44–1.00)
GFR calc non Af Amer: >60 mL/min (ref >60)
GLUCOSE: 92 mg/dL (ref 70–99)
POTASSIUM: 3.6 mmol/L (ref 3.5–5.1)
Sodium: 140 mmol/L (ref 135–145)

## 2017-10-02 NOTE — ED Provider Notes (Signed)
[facility]ERGENCY DEPARTMENT Provider Note   CSN: [id number] Arrival date & time: 10/02/17  1445     History   Chief Complaint Chief Complaint  Patient presents with  . Wound Check    HPI [name] is a 57 y.o. female.  Pt presents to the ED with swelling to left forearm.  The pt said she fell on 9/27 and hit her left elbow.  She is on Xarelto.  They drew a circle around the hematoma.  She said she was told to come back if the swelling worsened or if the swelling stretched beyond the circle. The pt said it has, so she is back.  She has no pain.  The ace wrap that was applied caused some irritation in her ac fossa, but otherwise, no redness.     Past Medical History:  Diagnosis Date  . Anemia, iron deficiency    secondary to menhorrhagia, on oral iron, also b12 def, getting monthly b12 shots  . CHF (congestive heart failure) ([place])   . Chronic cough    secondary to alleriges and post nasal drip  . Cognitive impairment   . COPD (chronic obstructive pulmonary disease) ([place])   . Cor pulmonale (HCC)    PA Peak pressure 16mHg  . Depression   . Diabetes mellitus    well controlled on metformin  . Diastolic heart failure ([place]   . GERD (gastroesophageal reflux disease)   . H/O mental retardation   . Herpes   . Hyperlipidemia   . Hypertension   . OSA (obstructive sleep apnea)    CPAP  . Pulmonary hypertension (HFowl[place]   . Renal disorder   . Restrictive lung disease    PFTs 06/2012 (FVC 54% predicted and FEV1 68% predicted w minimal bronchodilator response).  . Shortness of breath   . Venous stasis ulcer ([place]    chornic, ?followed up at wound care center, multiple courses of antibiotics in past for cellulitis, on lasix    Patient Active Problem List   Diagnosis Date Noted  . Ovarian cyst 05/19/2017  . Lower extremity edema 02/25/2017  . CAP (community acquired pneumonia) 02/10/2016  . Postmenopausal vaginal bleeding 03/21/2015  . Rash  10/31/2014  . Peripheral neuropathy (HL[place] 09/27/2014  . Healthcare maintenance 09/27/2014  . Chronic diastolic CHF (congestive heart failure) (H[place] 06/25/2013  . Pulmonary HTN (HC[place] 09/02/2012  . Atrial flutter (HPark [place] 08/17/2012  . Pulmonary hypertension (HL[place] 08/16/2012  . Right heart failure, NYHA class 3 (HCasa [place] 03/15/2012  . Allergic rhinitis 11/16/2011  . Diabetes (HE[place] 10/15/2010  . Hyperlipidemia 10/15/2010  . Obesity 10/14/2010  . Obstructive sleep apnea 02/21/2009  . GERD 11/29/2005  . ANEMIA, B12 DEFICIENCY 10/19/2005  . MENTAL RETARDATION 10/19/2005    Past Surgical History:  Procedure Laterality Date  . CARDIAC CATHETERIZATION N/A 01/13/2016   Procedure: Right Heart Cath;  Surgeon: DJolaine Artist MD;  [facility];  Service: Cardiovascular;  Laterality: N/A;  . CHOLECYSTECTOMY    . LEFT AND RIGHT HEART CATHETERIZATION WITH CORONARY ANGIOGRAM N/A 02/28/2014   Procedure: LEFT AND RIGHT HEART CATHETERIZATION WITH CORONARY ANGIOGRAM;  Surgeon: DJolaine Artist MD;  [facility];  Service: Cardiovascular;  Laterality: N/A;     OB History    Gravida  1   Para  1   Term  1   Preterm      AB      Living  1     SAB  TAB      Ectopic      Multiple      Live Births               Home Medications    Prior to Admission medications   Medication Sig Start Date End Date Taking? Authorizing Provider  acetaminophen (TYLENOL) 500 MG tablet Take 1 tablet (500 mg total) by mouth every 6 (six) hours as needed. Patient taking differently: Take 500 mg by mouth every 6 (six) hours as needed for mild pain.  08/21/15   [name], MD  cetirizine (ZYRTEC) 10 MG tablet TAKE ONE TABLET BY MOUTH ONCE DAILY 07/04/14   Corky Sox, MD  cyanocobalamin 500 MCG tablet Take 500 mcg by mouth daily.    [provider]  diphenhydrAMINE (BENADRYL) 25 mg capsule Take 25-75 mg by mouth 2 (two) times daily as needed for itching.      [provider]  ferrous sulfate 325 (65 FE) MG tablet Take 1 tablet (325 mg total) by mouth daily with breakfast. 07/23/14   Corky Sox, MD  gabapentin (NEURONTIN) 300 MG capsule TAKE 2 CAPSULES BY MOUTH THREE TIMES DAILY 09/26/17   [name], MD  glucose blood (ACCU-CHEK AVIVA PLUS) test strip Use one strip to check glucose once daily. Diagnosis code E11.42 09/24/16   [name], MD  ibuprofen (ADVIL,MOTRIN) 200 MG tablet Take 600 mg by mouth every 6 (six) hours as needed for moderate pain.    [provider]  metFORMIN (GLUCOPHAGE) 500 MG tablet TAKE ONE-HALF TABLET BY MOUTH WITH BREAKFAST 04/16/17   [name], MD  mupirocin ointment (BACTROBAN) 2 % Place 1 application into the nose 2 (two) times daily. Patient not taking: Reported on 09/29/2017 02/25/17   Ledell Noss, MD  nystatin cream (MYCOSTATIN) Apply 1 application topically 2 (two) times daily. Patient taking differently: Apply 1 application topically 2 (two) times daily as needed for dry skin.  08/20/16   Constant, Peggy, MD  omeprazole (PRILOSEC) 20 MG capsule Take 20 mg by mouth 2 (two) times daily as needed (acid reflux).    [provider]  pantoprazole (PROTONIX) 20 MG tablet Take 1 tablet (20 mg total) by mouth 2 (two) times daily. Patient not taking: Reported on 09/29/2017 04/16/17   [name], MD  potassium chloride SA (K-DUR,KLOR-CON) 20 MEQ tablet TAKE 2 TABLETS BY MOUTH THREE TIMES DAILY ,  TAKE  AN  EXTRA  TABLET  WHEN  TAKING  AN  EXTRA  TORSEMIDE 05/17/17   [name], MD  pravastatin (PRAVACHOL) 20 MG tablet TAKE 1 TABLET BY MOUTH ONCE DAILY 09/13/17   Oda Kilts, MD  rivaroxaban (XARELTO) 20 MG TABS tablet Take 1 tablet (20 mg total) by mouth daily with supper. 01/13/17   [name], MD  sildenafil (REVATIO) 20 MG tablet Take 1 tablet (20 mg total) by mouth 3 (three) times daily. 11/01/16   Bensimhon, [name], MD  sodium chloride (OCEAN) 0.65  % SOLN nasal spray Place 2 sprays into both nostrils as needed for congestion. 02/12/16   [name], MD  spironolactone (ALDACTONE) 25 MG tablet Take 0.5 tablets (12.5 mg total) by mouth daily. 04/16/17   [name], MD  torsemide (DEMADEX) 20 MG tablet Take 2 tablets (40 mg total) by mouth 2 (two) times daily. 07/05/17   Clegg, Amy D, NP  treprostinil (REMODULIN) 5 MG/ML SOLN injection Inject 6 ng/mL into the skin continuous.  [provider]    Family History Family History  Problem Relation Age of Onset  . Mental illness Sister   . Mental retardation Brother   . Breast cancer Maternal Aunt     Social History Social History   Tobacco Use  . Smoking status: Never Smoker  . Smokeless tobacco: Never Used  Substance Use Topics  . Alcohol use: No    Alcohol/week: 0.0 standard drinks  . Drug use: No     Allergies   Aspirin; Codeine; Lisinopril; Sulfonamide derivatives; and Latex   Review of Systems Review of Systems  Skin: Positive for wound.  All other systems reviewed and are negative.    Physical Exam Updated Vital Signs BP 119/60 (BP Location: Right Arm)   Pulse 77   Temp 97.8 F (36.6 C) (Oral)   Resp (!) 22   LMP 02/19/2011   SpO2 95%   Physical Exam  Constitutional: She appears well-developed and well-nourished.  HENT:  Head: Normocephalic and atraumatic.  Right Ear: External ear normal.  Left Ear: External ear normal.  Nose: Nose normal.  Mouth/Throat: Oropharynx is clear and moist.  Eyes: Pupils are equal, round, and reactive to light. Conjunctivae and EOM are normal.  Neck: Normal range of motion. Neck supple.  Cardiovascular: Normal rate, regular rhythm, normal heart sounds and intact distal pulses.  Pulmonary/Chest: Effort normal and breath sounds normal.  Abdominal: Soft. Bowel sounds are normal.  Musculoskeletal:  Left arm hematoma is spreading out, but no cellulitis.  Irritation to left AC fossa.  Skin: Skin is warm.  Capillary refill takes less than 2 seconds.  Psychiatric: She has a normal mood and affect. Her behavior is normal. Judgment and thought content normal.  Nursing note and vitals reviewed.    ED Treatments / Results  Labs (all labs ordered are listed, but only abnormal results are displayed) Labs Reviewed  BASIC METABOLIC PANEL - Abnormal; Notable for the following components:      Result Value   Calcium 8.7 (*)    All other components within normal limits  CBC WITH DIFFERENTIAL/PLATELET - Abnormal; Notable for the following components:   RBC 5.47 (*)    Hemoglobin 15.2 (*)    HCT 49.0 (*)    RDW 18.6 (*)    Monocytes Absolute 1.1 (*)    All other components within normal limits    EKG None  Radiology No results found.  Procedures Procedures (including critical care time)  Medications Ordered in ED Medications - No data to display   Initial Impression / Assessment and Plan / ED Course  I have reviewed the triage vital signs and the nursing notes.  Pertinent labs & imaging results that were available during my care of the patient were reviewed by me and considered in my medical decision making (see chart for details).    Pt's hgb is excellent.  She is stable for d/c.  She knows to return if worse.  Final Clinical Impressions(s) / ED Diagnoses   Final diagnoses:  Hematoma    ED Discharge Orders    None       Isla Pence, MD 10/02/17 2126

## 2017-10-02 NOTE — ED Triage Notes (Signed)
Pt has hematoma to L forearm that was evaluated on 9/26. Pt was told to come back if bruising or redness spread outside of circled area. Pt reports it has spread.

## 2017-10-04 DIAGNOSIS — I270 Primary pulmonary hypertension: Secondary | ICD-10-CM | POA: Diagnosis not present

## 2017-10-05 ENCOUNTER — Other Ambulatory Visit: Payer: Self-pay | Admitting: *Deleted

## 2017-10-05 DIAGNOSIS — E7800 Pure hypercholesterolemia, unspecified: Secondary | ICD-10-CM

## 2017-10-05 DIAGNOSIS — K219 Gastro-esophageal reflux disease without esophagitis: Secondary | ICD-10-CM

## 2017-10-05 MED ORDER — PRAVASTATIN SODIUM 20 MG PO TABS: 20.0000 mg | ORAL_TABLET | Freq: Every day | ORAL | 0 refills | Status: DC

## 2017-10-05 MED ORDER — PANTOPRAZOLE SODIUM 20 MG PO TBEC: 20.0000 mg | DELAYED_RELEASE_TABLET | Freq: Two times a day (BID) | ORAL | 11 refills | Status: DC

## 2017-10-05 NOTE — Telephone Encounter (Signed)
refilled 

## 2017-10-10 DIAGNOSIS — Z961 Presence of intraocular lens: Secondary | ICD-10-CM | POA: Diagnosis not present

## 2017-10-10 DIAGNOSIS — H40013 Open angle with borderline findings, low risk, bilateral: Secondary | ICD-10-CM | POA: Diagnosis not present

## 2017-10-10 DIAGNOSIS — H35033 Hypertensive retinopathy, bilateral: Secondary | ICD-10-CM | POA: Diagnosis not present

## 2017-10-10 DIAGNOSIS — H26493 Other secondary cataract, bilateral: Secondary | ICD-10-CM | POA: Diagnosis not present

## 2017-10-10 DIAGNOSIS — E119 Type 2 diabetes mellitus without complications: Secondary | ICD-10-CM | POA: Diagnosis not present

## 2017-10-10 LAB — HM DIABETES EYE EXAM

## 2017-10-11 ENCOUNTER — Encounter: Payer: Self-pay | Admitting: *Deleted

## 2017-10-20 ENCOUNTER — Ambulatory Visit: Payer: Self-pay | Admitting: Dietician

## 2017-10-28 ENCOUNTER — Other Ambulatory Visit (HOSPITAL_COMMUNITY): Payer: Self-pay

## 2017-10-31 ENCOUNTER — Other Ambulatory Visit: Payer: Self-pay

## 2017-10-31 ENCOUNTER — Ambulatory Visit (HOSPITAL_COMMUNITY)
Admission: RE | Admit: 2017-10-31 | Discharge: 2017-10-31 | Disposition: A | Discharge status: Home / Self Care | Payer: Medicaid Other | Source: Ambulatory Visit | Attending: Internal Medicine | Admitting: Internal Medicine

## 2017-10-31 ENCOUNTER — Encounter: Payer: Self-pay | Admitting: Internal Medicine

## 2017-10-31 ENCOUNTER — Ambulatory Visit (INDEPENDENT_AMBULATORY_CARE_PROVIDER_SITE_OTHER): Payer: Medicaid Other | Admitting: Internal Medicine

## 2017-10-31 VITALS — BP 106/61 | HR 88 | Temp 98.1°F | Ht 64.0 in | Wt 291.0 lb

## 2017-10-31 DIAGNOSIS — S40022D Contusion of left upper arm, subsequent encounter: Secondary | ICD-10-CM | POA: Insufficient documentation

## 2017-10-31 DIAGNOSIS — Z9981 Dependence on supplemental oxygen: Secondary | ICD-10-CM | POA: Diagnosis not present

## 2017-10-31 DIAGNOSIS — R05 Cough: Secondary | ICD-10-CM | POA: Insufficient documentation

## 2017-10-31 DIAGNOSIS — E1151 Type 2 diabetes mellitus with diabetic peripheral angiopathy without gangrene: Secondary | ICD-10-CM | POA: Diagnosis not present

## 2017-10-31 DIAGNOSIS — R058 Other specified cough: Secondary | ICD-10-CM

## 2017-10-31 DIAGNOSIS — W228XXD Striking against or struck by other objects, subsequent encounter: Secondary | ICD-10-CM

## 2017-10-31 DIAGNOSIS — R918 Other nonspecific abnormal finding of lung field: Secondary | ICD-10-CM | POA: Insufficient documentation

## 2017-10-31 DIAGNOSIS — Z79899 Other long term (current) drug therapy: Secondary | ICD-10-CM | POA: Diagnosis not present

## 2017-10-31 DIAGNOSIS — Z7984 Long term (current) use of oral hypoglycemic drugs: Secondary | ICD-10-CM

## 2017-10-31 DIAGNOSIS — J189 Pneumonia, unspecified organism: Secondary | ICD-10-CM

## 2017-10-31 DIAGNOSIS — I272 Pulmonary hypertension, unspecified: Secondary | ICD-10-CM | POA: Diagnosis not present

## 2017-10-31 DIAGNOSIS — E1142 Type 2 diabetes mellitus with diabetic polyneuropathy: Secondary | ICD-10-CM

## 2017-10-31 DIAGNOSIS — S5012XD Contusion of left forearm, subsequent encounter: Secondary | ICD-10-CM | POA: Diagnosis not present

## 2017-10-31 DIAGNOSIS — R0602 Shortness of breath: Secondary | ICD-10-CM | POA: Diagnosis not present

## 2017-10-31 DIAGNOSIS — Z7901 Long term (current) use of anticoagulants: Secondary | ICD-10-CM

## 2017-10-31 LAB — POCT GLYCOSYLATED HEMOGLOBIN (HGB A1C): HEMOGLOBIN A1C: 6.1 % — AB (ref 4.0–5.6)

## 2017-10-31 LAB — GLUCOSE, CAPILLARY: GLUCOSE-CAPILLARY: 91 mg/dL (ref 70–99)

## 2017-10-31 MED ORDER — DOXYCYCLINE HYCLATE 100 MG PO TABS: 100.0000 mg | ORAL_TABLET | Freq: Two times a day (BID) | ORAL | 0 refills | Status: AC

## 2017-10-31 MED ORDER — AMOXICILLIN 500 MG PO TABS: 1000.0000 mg | ORAL_TABLET | Freq: Three times a day (TID) | ORAL | 0 refills | Status: AC

## 2017-10-31 NOTE — Progress Notes (Signed)
CC: CAP, T2DM, hx of recent left arm hematoma  HPI:  [name] is a 57 y.o. female with PMH below.  Today we will address her new onset CAP and her chronic T2DM and history of recent left arm hematoma.  Please see A&P for status of the patient's chronic medical conditions  Past Medical History:  Diagnosis Date  . Anemia, iron deficiency    secondary to menhorrhagia, on oral iron, also b12 def, getting monthly b12 shots  . CHF (congestive heart failure) ([place])   . Chronic cough    secondary to alleriges and post nasal drip  . Cognitive impairment   . COPD (chronic obstructive pulmonary disease) ([place])   . Cor pulmonale (HCC)    PA Peak pressure 45mHg  . Depression   . Diabetes mellitus    well controlled on metformin  . Diastolic heart failure ([place]   . GERD (gastroesophageal reflux disease)   . H/O mental retardation   . Herpes   . Hyperlipidemia   . Hypertension   . OSA (obstructive sleep apnea)    CPAP  . Pulmonary hypertension ([place]   . Renal disorder   . Restrictive lung disease    PFTs 06/2012 (FVC 54% predicted and FEV1 68% predicted w minimal bronchodilator response).  . Shortness of breath   . Venous stasis ulcer (HCC)    chornic, ?followed up at wound care center, multiple courses of antibiotics in past for cellulitis, on lasix   Review of Systems: ROS: Pulmonary: pt endorses increased work of breathing and increased shortness of breath,  Cardiac: pt denies palpitations, chest pain,  Abdominal: pt denies abdominal pain, nausea, vomiting, or diarrhea   Physical Exam:  Vitals:   10/31/17 1315  BP: 106/61  Pulse: 88  Temp: 98.1 F (36.7 C)  SpO2: 95%  Height: _0  (1.626 m)   Cardiac: normal rate and rhythm, clear s1 and s2 Pulmonary: mild decreased breath sounds in left base, Rales appreciated in RLF Abdominal: non distended abdomen, soft and nontender Extremities: chronic 2-3+ pitting edema of bilateral LE present, chronic stasis  dermatitis unchanged Left arm no hematoma, erythema, edema or other skin changes observed Psych: Alert, conversant, in good spirits   Social History   Socioeconomic History  . Marital status: Single    Spouse name: Not on file  . Number of children: Not on file  . Years of education: Not on file  . Highest education level: Not on file  Occupational History  . Not on file  Social Needs  . Financial resource strain: Not on file  . Food insecurity:    Worry: Not on file    Inability: Not on file  . Transportation needs:    Medical: Not on file    Non-medical: Not on file  Tobacco Use  . Smoking status: Never Smoker  . Smokeless tobacco: Never Used  Substance and Sexual Activity  . Alcohol use: No    Alcohol/week: 0.0 standard drinks  . Drug use: No  . Sexual activity: Never  Lifestyle  . Physical activity:    Days per week: Not on file    Minutes per session: Not on file  . Stress: Not on file  Relationships  . Social connections:    Talks on phone: Not on file    Gets together: Not on file    Attends religious service: Not on file    Active member of club or organization: Not on file    Attends  meetings of clubs or organizations: Not on file    Relationship status: Not on file  . Intimate partner violence:    Fear of current or ex partner: Not on file    Emotionally abused: Not on file    Physically abused: Not on file    Forced sexual activity: Not on file  Other Topics Concern  . Not on file  Social History Narrative  . Not on file    Family History  Problem Relation Age of Onset  . Mental illness Sister   . Mental retardation Brother   . Breast cancer Maternal Aunt     Assessment & Plan:   See Encounters Tab for problem based charting.  Patient discussed with Dr. Lynnae January

## 2017-10-31 NOTE — Patient Instructions (Signed)
[name] based on your exam, x-ray and what you've been telling me I feel that you have acquired a pneumonia of your right lung.  I have prescribed you some antibiotics to take that will help you.  Please take them until they are completely gone. If you do not begin to feel better after they are complete please return for further evaluation.  You can continue taking the robitussin over the counter for your symptoms.  Your diabetes is still doing excellent.  We will keep the metformin as you requested.

## 2017-10-31 NOTE — Assessment & Plan Note (Addendum)
Here to follow up on a hematoma on her left forearm/elbow.  She was told to go to her primary doctor to follow up on this after being seen twice in the emergency department.  She tells me she bumped her arm quite hard at home and due to her blood thinner developed a purplish hematoma that was very slow to resolve.  Today this hematoma has completely resolved.  -NTD -counseled on how to manage these in the future

## 2017-10-31 NOTE — Assessment & Plan Note (Addendum)
She complains today of some congestion, worsening productive cough with green sputum which has affected her breathing.  On Friday is when she noticed this started.  Felt very hot took a cold shower but didn't check for any fever.  Has tried robitussin over the counter which helped a little but feels like she still can't shake the cough.  Her appetite has decreased since this began as well and she feels overall not herself.  Her physical exam x-ray findings and history are consistent with a pneumonia.  Her vitals are normal today and she is oxygenating at her baseline on her home oxygen for PAH.    -will treat with a five day course of amoxicillin and doxycycline -return instructions given -she accidentally left the house today with an empty oxygen tank and we will make sure she leaves with a full tank

## 2017-10-31 NOTE — Assessment & Plan Note (Signed)
Pt requires refills on medications with associated diagnosis above.  Reviewed disease process and find this medication to be necessary, will not change dose or alter current therapy. 

## 2017-10-31 NOTE — Assessment & Plan Note (Addendum)
Lab Results  Component Value Date   HGBA1C 6.1 (A) 10/31/2017   A1C looks good today.  I discussed with the patient starting a trial off therapy as she is currently only on 229m and working with our dietician.  The patient is very afraid to come completely off the metformin and worries her diabetes will be out of control.    -we will continue the metformin for now -can move a1c checks to every 6 months or one year

## 2017-11-01 DIAGNOSIS — I272 Pulmonary hypertension, unspecified: Secondary | ICD-10-CM | POA: Diagnosis not present

## 2017-11-01 DIAGNOSIS — R0609 Other forms of dyspnea: Secondary | ICD-10-CM | POA: Diagnosis not present

## 2017-11-01 DIAGNOSIS — G4733 Obstructive sleep apnea (adult) (pediatric): Secondary | ICD-10-CM | POA: Diagnosis not present

## 2017-11-01 DIAGNOSIS — I5032 Chronic diastolic (congestive) heart failure: Secondary | ICD-10-CM | POA: Diagnosis not present

## 2017-11-02 NOTE — Progress Notes (Signed)
Internal Medicine Clinic Attending  Case discussed with Dr. Shan Levans at the time of the visit.  We reviewed the resident's history and exam and pertinent patient test results.  I agree with the assessment, diagnosis, and plan of care documented in the resident's note.  Dr Shan Levans and I indep viewed the CXR images which were c/w a RLL infiltrate, confirming his PE findings.

## 2017-11-04 DIAGNOSIS — I270 Primary pulmonary hypertension: Secondary | ICD-10-CM | POA: Diagnosis not present

## 2017-11-04 DIAGNOSIS — I5030 Unspecified diastolic (congestive) heart failure: Secondary | ICD-10-CM | POA: Diagnosis not present

## 2017-11-04 DIAGNOSIS — J984 Other disorders of lung: Secondary | ICD-10-CM | POA: Diagnosis not present

## 2017-11-04 DIAGNOSIS — G4730 Sleep apnea, unspecified: Secondary | ICD-10-CM | POA: Diagnosis not present

## 2017-11-14 DIAGNOSIS — J984 Other disorders of lung: Secondary | ICD-10-CM | POA: Diagnosis not present

## 2017-11-14 DIAGNOSIS — G4733 Obstructive sleep apnea (adult) (pediatric): Secondary | ICD-10-CM | POA: Diagnosis not present

## 2017-11-14 DIAGNOSIS — I272 Pulmonary hypertension, unspecified: Secondary | ICD-10-CM | POA: Diagnosis not present

## 2017-11-14 DIAGNOSIS — G4730 Sleep apnea, unspecified: Secondary | ICD-10-CM | POA: Diagnosis not present

## 2017-11-14 DIAGNOSIS — I270 Primary pulmonary hypertension: Secondary | ICD-10-CM | POA: Diagnosis not present

## 2017-11-24 ENCOUNTER — Ambulatory Visit: Payer: Medicaid Other | Admitting: Dietician

## 2017-11-24 ENCOUNTER — Encounter: Payer: Self-pay | Admitting: Dietician

## 2017-11-24 DIAGNOSIS — E1142 Type 2 diabetes mellitus with diabetic polyneuropathy: Secondary | ICD-10-CM

## 2017-11-24 NOTE — Progress Notes (Signed)
Diabetes Group meeting Documentation:  start time:10:15 AM   end time: [place] attended diabetes education group visit today for 60 minutes. The program included successes and challenges over the past few weeks. We discussed healthy food choices including how to make healthy snacks using microwave and popcorn. Our activity was planning meals using the plate method.  The goal she/he is working on OX:BDZH sweet and salt.  Her goal for the next few weeks is: more vegetables  Plan: attend diabetes group meeting next month Debera Lat, RD 11/24/2017 12:13 PM.

## 2017-12-02 ENCOUNTER — Other Ambulatory Visit: Payer: Self-pay | Admitting: Internal Medicine

## 2017-12-02 DIAGNOSIS — E7800 Pure hypercholesterolemia, unspecified: Secondary | ICD-10-CM

## 2017-12-02 DIAGNOSIS — E876 Hypokalemia: Secondary | ICD-10-CM

## 2017-12-04 DIAGNOSIS — G4730 Sleep apnea, unspecified: Secondary | ICD-10-CM | POA: Diagnosis not present

## 2017-12-04 DIAGNOSIS — J984 Other disorders of lung: Secondary | ICD-10-CM | POA: Diagnosis not present

## 2017-12-04 DIAGNOSIS — I5030 Unspecified diastolic (congestive) heart failure: Secondary | ICD-10-CM | POA: Diagnosis not present

## 2017-12-04 DIAGNOSIS — I270 Primary pulmonary hypertension: Secondary | ICD-10-CM | POA: Diagnosis not present

## 2017-12-05 NOTE — Telephone Encounter (Signed)
refilled

## 2017-12-22 ENCOUNTER — Encounter (HOSPITAL_COMMUNITY): Payer: Self-pay | Admitting: Emergency Medicine

## 2017-12-22 ENCOUNTER — Other Ambulatory Visit: Payer: Self-pay

## 2017-12-22 ENCOUNTER — Emergency Department (HOSPITAL_COMMUNITY)
Admission: EM | Admit: 2017-12-22 | Discharge: 2017-12-22 | Disposition: A | Discharge status: Home / Self Care | Payer: Medicaid Other | Attending: Emergency Medicine | Admitting: Emergency Medicine

## 2017-12-22 ENCOUNTER — Ambulatory Visit: Payer: Medicaid Other | Admitting: Dietician

## 2017-12-22 ENCOUNTER — Telehealth (HOSPITAL_COMMUNITY): Payer: Self-pay

## 2017-12-22 ENCOUNTER — Emergency Department (HOSPITAL_COMMUNITY): Payer: Medicaid Other

## 2017-12-22 DIAGNOSIS — I509 Heart failure, unspecified: Secondary | ICD-10-CM | POA: Diagnosis not present

## 2017-12-22 DIAGNOSIS — R0602 Shortness of breath: Secondary | ICD-10-CM | POA: Diagnosis not present

## 2017-12-22 DIAGNOSIS — J441 Chronic obstructive pulmonary disease with (acute) exacerbation: Secondary | ICD-10-CM | POA: Insufficient documentation

## 2017-12-22 DIAGNOSIS — Z7901 Long term (current) use of anticoagulants: Secondary | ICD-10-CM | POA: Insufficient documentation

## 2017-12-22 DIAGNOSIS — I10 Essential (primary) hypertension: Secondary | ICD-10-CM | POA: Diagnosis not present

## 2017-12-22 DIAGNOSIS — Z79899 Other long term (current) drug therapy: Secondary | ICD-10-CM | POA: Diagnosis not present

## 2017-12-22 DIAGNOSIS — I4891 Unspecified atrial fibrillation: Secondary | ICD-10-CM | POA: Insufficient documentation

## 2017-12-22 DIAGNOSIS — Z7984 Long term (current) use of oral hypoglycemic drugs: Secondary | ICD-10-CM | POA: Diagnosis not present

## 2017-12-22 DIAGNOSIS — Z91040 Latex allergy status: Secondary | ICD-10-CM | POA: Insufficient documentation

## 2017-12-22 DIAGNOSIS — E119 Type 2 diabetes mellitus without complications: Secondary | ICD-10-CM | POA: Diagnosis not present

## 2017-12-22 DIAGNOSIS — R079 Chest pain, unspecified: Secondary | ICD-10-CM | POA: Diagnosis not present

## 2017-12-22 DIAGNOSIS — E1142 Type 2 diabetes mellitus with diabetic polyneuropathy: Secondary | ICD-10-CM

## 2017-12-22 LAB — BASIC METABOLIC PANEL
Anion gap: 11 (ref 5–15)
BUN: 8 mg/dL (ref 6–20)
CALCIUM: 8.4 mg/dL — AB (ref 8.9–10.3)
CO2: 25 mmol/L (ref 22–32)
Chloride: 102 mmol/L (ref 98–111)
Creatinine, Ser: 0.78 mg/dL (ref 0.44–1.00)
Glucose, Bld: 109 mg/dL — ABNORMAL HIGH (ref 70–99)
Potassium: 3.6 mmol/L (ref 3.5–5.1)
SODIUM: 138 mmol/L (ref 135–145)

## 2017-12-22 LAB — CBC WITH DIFFERENTIAL/PLATELET
Abs Immature Granulocytes: 0.06 10*3/uL (ref 0.00–0.07)
BASOS PCT: 0 %
Basophils Absolute: 0 10*3/uL (ref 0.0–0.1)
EOS ABS: 0 10*3/uL (ref 0.0–0.5)
Eosinophils Relative: 0 %
HCT: 48.3 % — ABNORMAL HIGH (ref 36.0–46.0)
Hemoglobin: 15 g/dL (ref 12.0–15.0)
Immature Granulocytes: 1 %
Lymphocytes Relative: 20 %
Lymphs Abs: 1.7 10*3/uL (ref 0.7–4.0)
MCH: 27.6 pg (ref 26.0–34.0)
MCHC: 31.1 g/dL (ref 30.0–36.0)
MCV: 88.8 fL (ref 80.0–100.0)
MONOS PCT: 10 %
Monocytes Absolute: 0.9 10*3/uL (ref 0.1–1.0)
NEUTROS PCT: 69 %
NRBC: 0 % (ref 0.0–0.2)
Neutro Abs: 6.2 10*3/uL (ref 1.7–7.7)
PLATELETS: 405 10*3/uL — AB (ref 150–400)
RBC: 5.44 MIL/uL — ABNORMAL HIGH (ref 3.87–5.11)
RDW: 17.5 % — AB (ref 11.5–15.5)
WBC: 8.9 10*3/uL (ref 4.0–10.5)

## 2017-12-22 LAB — I-STAT VENOUS BLOOD GAS, ED
Acid-Base Excess: 4 mmol/L — ABNORMAL HIGH (ref 0.0–2.0)
Bicarbonate: 29.7 mmol/L — ABNORMAL HIGH (ref 20.0–28.0)
O2 SAT: 87 %
TCO2: 31 mmol/L (ref 22–32)
pCO2, Ven: 47.1 mmHg (ref 44.0–60.0)
pH, Ven: 7.408 (ref 7.250–7.430)
pO2, Ven: 53 mmHg — ABNORMAL HIGH (ref 32.0–45.0)

## 2017-12-22 LAB — I-STAT TROPONIN, ED: Troponin i, poc: 0.01 ng/mL (ref 0.00–0.08)

## 2017-12-22 LAB — D-DIMER, QUANTITATIVE: D-Dimer, Quant: 0.4 ug/mL-FEU (ref 0.00–0.50)

## 2017-12-22 LAB — BRAIN NATRIURETIC PEPTIDE: B Natriuretic Peptide: 47.4 pg/mL (ref 0.0–100.0)

## 2017-12-22 MED ORDER — IPRATROPIUM-ALBUTEROL 0.5-2.5 (3) MG/3ML IN SOLN
3.0000 mL | Freq: Once | RESPIRATORY_TRACT | Status: AC
  Administered 2017-12-22: 3 mL via RESPIRATORY_TRACT
  Filled 2017-12-22: qty 3

## 2017-12-22 NOTE — Discharge Instructions (Addendum)
Increase your oxygen to 4 L at all times. You will need to follow-up with your primary care provider tomorrow morning. Continue your other home medications as previously prescribed. Return to ED for worsening symptoms, trouble breathing or swallowing, coughing up blood, injuries or falls.

## 2017-12-22 NOTE — ED Notes (Addendum)
Ambulated pt on pulse ox. Pts O2 sats were 88% with 3L O2 while ambulating

## 2017-12-22 NOTE — Progress Notes (Signed)
Diabetes Group meeting Documentation:  start time:10:15 AM   end time: [place] attended diabetes education group visit today for 60 minutes. The program included successes and challenges over the past few weeks. We discussed healthy food choices including old fashioned oatmeal sweetened with bananas and cinnamon. Our activity was keeping a record to help eat something from  all of food groups every day over the holidays .  She did not want to weigh today.   The goal she/he is working on BL:[id number] and working to improve her health conditions.   Plan: attend diabetes group meeting next month Debera Lat, RD 12/22/2017 2:42 PM.

## 2017-12-22 NOTE — ED Notes (Signed)
Called [facility] 

## 2017-12-22 NOTE — ED Provider Notes (Signed)
[facility] EMERGENCY DEPARTMENT Provider Note   CSN: [id number] Arrival date & time: 12/22/17  1154     History   Chief Complaint Chief Complaint  Patient presents with  . Shortness of Breath    HPI [name] is a 57 y.o. female with a past medical history of CHF, COPD, pulmonary hypertension, diabetes, hypertension, who presents to ED for evaluation of 2 to 3-day history of shortness of breath, intermittent chest pain and dizziness.  States that she has an intermittent dry and sometimes productive cough with mucus.  She has not tried any medications to help with her symptoms.  She is unsure if her shortness of breath is related to her CHF because she is noticed that her bilateral legs have gotten more swollen.  However, patient is a poor historian and unable to stay on track with the questions I am asking her.  She denies any abdominal pain, vomiting, fever, hemoptysis, headache or vision changes.  She did not take her torsemide yesterday or today. Of note, patient was treated for community-acquired pneumonia about 2 months ago.  She completed the entire course of these antibiotics. She is on 3 L of oxygen at all times at home.  HPI  Past Medical History:  Diagnosis Date  . Anemia, iron deficiency    secondary to menhorrhagia, on oral iron, also b12 def, getting monthly b12 shots  . CHF (congestive heart failure) ([place])   . Chronic cough    secondary to alleriges and post nasal drip  . Cognitive impairment   . COPD (chronic obstructive pulmonary disease) ([place])   . Cor pulmonale (HCC)    PA Peak pressure 33mHg  . Depression   . Diabetes mellitus    well controlled on metformin  . Diastolic heart failure ([place]   . GERD (gastroesophageal reflux disease)   . H/O mental retardation   . Herpes   . Hyperlipidemia   . Hypertension   . OSA (obstructive sleep apnea)    CPAP  . Pulmonary hypertension ([facility]   . Renal disorder   . Restrictive lung disease     PFTs 06/2012 (FVC 54% predicted and FEV1 68% predicted w minimal bronchodilator response).  . Shortness of breath   . Venous stasis ulcer ([place]    chornic, ?followed up at wound care center, multiple courses of antibiotics in past for cellulitis, on lasix    Patient Active Problem List   Diagnosis Date Noted  . Hematoma of arm, left, subsequent encounter 10/31/2017  . Ovarian cyst 05/19/2017  . Lower extremity edema 02/25/2017  . CAP (community acquired pneumonia) 02/10/2016  . Postmenopausal vaginal bleeding 03/21/2015  . Rash 10/31/2014  . Peripheral neuropathy ([facility] 09/27/2014  . Healthcare maintenance 09/27/2014  . Chronic diastolic CHF (congestive heart failure) ([facility] 06/25/2013  . Pulmonary HTN ([place] 09/02/2012  . Atrial flutter ([facility] 08/17/2012  . Pulmonary hypertension ([facility] 08/16/2012  . Right heart failure, NYHA class 3 ([facility] [place] 03/15/2012  . Allergic rhinitis 11/16/2011  . Diabetes ([place] 10/15/2010  . Hyperlipidemia 10/15/2010  . Obesity 10/14/2010  . Obstructive sleep apnea 02/21/2009  . GERD 11/29/2005  . ANEMIA, B12 DEFICIENCY 10/19/2005  . MENTAL RETARDATION 10/19/2005    Past Surgical History:  Procedure Laterality Date  . CARDIAC CATHETERIZATION N/A 01/13/2016   Procedure: Right Heart Cath;  Surgeon: DJolaine Artist MD;  [facility];  Service: Cardiovascular;  Laterality: N/A;  . CHOLECYSTECTOMY    . LEFT AND RIGHT HEART CATHETERIZATION  WITH CORONARY ANGIOGRAM N/A 02/28/2014   Procedure: LEFT AND RIGHT HEART CATHETERIZATION WITH CORONARY ANGIOGRAM;  Surgeon: Jolaine Artist, MD;  Location[facility] CATH LAB;  Service: Cardiovascular;  Laterality: N/A;     OB History    Gravida  1   Para  1   Term  1   Preterm      AB      Living  1     SAB      TAB      Ectopic      Multiple      Live Births               Home Medications    Prior to Admission medications   Medication Sig Start Date End Date Taking? Authorizing  Provider  acetaminophen (TYLENOL) 500 MG tablet Take 1 tablet (500 mg total) by mouth every 6 (six) hours as needed. Patient taking differently: Take 500 mg by mouth every 6 (six) hours as needed for mild pain.  08/21/15   [name], MD  cetirizine (ZYRTEC) 10 MG tablet TAKE ONE TABLET BY MOUTH ONCE DAILY 07/04/14   Corky Sox, MD  cyanocobalamin 500 MCG tablet Take 500 mcg by mouth daily.    [provider]  diphenhydrAMINE (BENADRYL) 25 mg capsule Take 25-75 mg by mouth 2 (two) times daily as needed for itching.     [provider]  ferrous sulfate 325 (65 FE) MG tablet Take 1 tablet (325 mg total) by mouth daily with breakfast. 07/23/14   Corky Sox, MD  gabapentin (NEURONTIN) 300 MG capsule TAKE 2 CAPSULES BY MOUTH THREE TIMES DAILY 09/26/17   [name], MD  glucose blood (ACCU-CHEK AVIVA PLUS) test strip Use one strip to check glucose once daily. Diagnosis code E11.42 09/24/16   [name], MD  ibuprofen (ADVIL,MOTRIN) 200 MG tablet Take 600 mg by mouth every 6 (six) hours as needed for moderate pain.    [provider]  metFORMIN (GLUCOPHAGE) 500 MG tablet TAKE ONE-HALF TABLET BY MOUTH WITH BREAKFAST 04/16/17   [name], MD  mupirocin ointment (BACTROBAN) 2 % Place 1 application into the nose 2 (two) times daily. Patient not taking: Reported on 09/29/2017 02/25/17   Ledell Noss, MD  nystatin cream (MYCOSTATIN) Apply 1 application topically 2 (two) times daily. Patient taking differently: Apply 1 application topically 2 (two) times daily as needed for dry skin.  08/20/16   Constant, Peggy, MD  omeprazole (PRILOSEC) 20 MG capsule Take 20 mg by mouth 2 (two) times daily as needed (acid reflux).    [provider]  pantoprazole (PROTONIX) 20 MG tablet Take 1 tablet (20 mg total) by mouth 2 (two) times daily. 10/05/17   [name], MD  potassium chloride SA (K-DUR,KLOR-CON) 20 MEQ tablet TAKE 2 TABLETS BY MOUTH THREE TIMES  DAILY TAKE  AN  EXTRA  TABLET  WHEN  TAKING  AN  EXTRA  TORSEMIDE 12/05/17   [name], MD  pravastatin (PRAVACHOL) 20 MG tablet TAKE 1 TABLET BY MOUTH ONCE DAILY 12/05/17   [name], MD  rivaroxaban (XARELTO) 20 MG TABS tablet Take 1 tablet (20 mg total) by mouth daily with supper. 01/13/17   [name], MD  sildenafil (REVATIO) 20 MG tablet Take 1 tablet (20 mg total) by mouth 3 (three) times daily. 11/01/16   Bensimhon, [name], MD  sodium chloride (OCEAN) 0.65 % SOLN nasal spray Place 2 sprays into  both nostrils as needed for congestion. 02/12/16   [name], MD  spironolactone (ALDACTONE) 25 MG tablet Take 0.5 tablets (12.5 mg total) by mouth daily. 04/16/17   [name], MD  torsemide (DEMADEX) 20 MG tablet Take 2 tablets (40 mg total) by mouth 2 (two) times daily. 07/05/17   Clegg, Amy D, NP  treprostinil (REMODULIN) 5 MG/ML SOLN injection Inject 6 ng/mL into the skin continuous.     [provider]    Family History Family History  Problem Relation Age of Onset  . Mental illness Sister   . Mental retardation Brother   . Breast cancer Maternal Aunt     Social History Social History   Tobacco Use  . Smoking status: Never Smoker  . Smokeless tobacco: Never Used  Substance Use Topics  . Alcohol use: No    Alcohol/week: 0.0 standard drinks  . Drug use: No     Allergies   Aspirin; Codeine; Lisinopril; Sulfonamide derivatives; and Latex   Review of Systems Review of Systems  Constitutional: Negative for appetite change, chills and fever.  HENT: Negative for ear pain, rhinorrhea, sneezing and sore throat.   Eyes: Negative for photophobia and visual disturbance.  Respiratory: Positive for cough and shortness of breath. Negative for chest tightness and wheezing.   Cardiovascular: Positive for chest pain and leg swelling. Negative for palpitations.  Gastrointestinal: Negative for abdominal pain, blood in stool, constipation, diarrhea,  nausea and vomiting.  Genitourinary: Negative for dysuria, hematuria and urgency.  Musculoskeletal: Negative for myalgias.  Skin: Negative for rash.  Neurological: Negative for dizziness, weakness and light-headedness.     Physical Exam Updated Vital Signs BP 114/63   Pulse 79   Temp 98.5 F (36.9 C) (Oral)   Resp 14   Ht _0  (1.6 m)   Wt 127 kg   LMP 02/19/2011   SpO2 94%   BMI 49.60 kg/m   Physical Exam Vitals signs and nursing note reviewed.  Constitutional:      General: She is not in acute distress.    Appearance: She is well-developed.     Comments: 4 L of oxygen being delivered via nasal cannula.  HENT:     Head: Normocephalic and atraumatic.     Nose: Nose normal.  Eyes:     General: No scleral icterus.       Left eye: No discharge.     Conjunctiva/sclera: Conjunctivae normal.  Neck:     Musculoskeletal: Normal range of motion and neck supple.  Cardiovascular:     Rate and Rhythm: Normal rate and regular rhythm.     Heart sounds: Normal heart sounds. No murmur. No friction rub. No gallop.   Pulmonary:     Effort: Pulmonary effort is normal. Tachypnea present. No respiratory distress.     Breath sounds: Examination of the right-middle field reveals wheezing. Examination of the left-middle field reveals wheezing. Examination of the right-lower field reveals wheezing. Examination of the left-lower field reveals wheezing. Wheezing present.  Abdominal:     General: Bowel sounds are normal. There is no distension.     Palpations: Abdomen is soft.     Tenderness: There is no abdominal tenderness. There is no guarding.  Musculoskeletal: Normal range of motion.     Right lower leg: Edema present.     Left lower leg: Edema present.     Comments: Bilateral nonpitting edema in lower extremities.  Mild discoloration noted bilaterally.  No calf tenderness bilaterally.  Skin:  General: Skin is warm and dry.     Findings: No rash.  Neurological:     General: No  focal deficit present.     Mental Status: She is alert and oriented to person, place, and time.     Cranial Nerves: No cranial nerve deficit.     Motor: No weakness or abnormal muscle tone.     Coordination: Coordination normal.      ED Treatments / Results  Labs (all labs ordered are listed, but only abnormal results are displayed) Labs Reviewed  BASIC METABOLIC PANEL - Abnormal; Notable for the following components:      Result Value   Glucose, Bld 109 (*)    Calcium 8.4 (*)    All other components within normal limits  CBC WITH DIFFERENTIAL/PLATELET - Abnormal; Notable for the following components:   RBC 5.44 (*)    HCT 48.3 (*)    RDW 17.5 (*)    Platelets 405 (*)    All other components within normal limits  I-STAT VENOUS BLOOD GAS, ED - Abnormal; Notable for the following components:   pO2, Ven 53.0 (*)    Bicarbonate 29.7 (*)    Acid-Base Excess 4.0 (*)    All other components within normal limits  BRAIN NATRIURETIC PEPTIDE  D-DIMER, QUANTITATIVE (NOT AT [facility])  I-STAT TROPONIN, ED    EKG EKG Interpretation  Date/Time:  Thursday December 22 2017 12:06:18 EST Ventricular Rate:  84 PR Interval:    QRS Duration: 107 QT Interval:  420 QTC Calculation: 497 R Axis:   129 Text Interpretation:  Sinus rhythm Prolonged PR interval Probable left atrial enlargement Low voltage, precordial leads Probable right ventricular hypertrophy Borderline prolonged QT interval Confirmed by Julianne Rice [phone]) on 12/22/2017 2:02:17 PM   Radiology Dg Chest 2 View  Result Date: 12/22/2017 CLINICAL DATA:  Shortness of breath and chest pain EXAM: CHEST - 2 VIEW COMPARISON:  10/31/2017 FINDINGS: Chronic cardiomegaly and vascular pedicle widening. Central line on the right with tip at the [facility]. Chronic interstitial prominence, especially behind the heart, seen on prior in 2018 comparison. No Kerley lines, effusion, or pneumothorax. Exaggerated thoracic kyphosis with spondylosis causing  multilevel ankylosis. IMPRESSION: Stable when compared to priors. Chronic cardiomegaly and vascular congestion. Electronically Signed   By: Monte Fantasia M.D.   On: 12/22/2017 14:32    Procedures Procedures (including critical care time)  Medications Ordered in ED Medications  ipratropium-albuterol (DUONEB) 0.5-2.5 (3) MG/3ML nebulizer solution 3 mL (3 mLs Nebulization Given 12/22/17 1248)     Initial Impression / Assessment and Plan / ED Course  I have reviewed the triage vital signs and the nursing notes.  Pertinent labs & imaging results that were available during my care of the patient were reviewed by me and considered in my medical decision making (see chart for details).     57 year old female with a past medical history of CHF, COPD, pulmonary hypertension, diabetes presents to ED for evaluation of 2 to 3-day history of shortness of breath, intermittent chest pain and dizziness.  She also reports intermittent dry cough and sometimes productive with mucus.  Unsure if this is related to her CHF as she feels like her legs are more swollen.  Patient unable to give a good history as she is a poor historian.  She is on 3 L of oxygen by nasal cannula at all times.  On exam patient had some mild wheezing noted initially.  This improved with a DuoNeb.  She has some  bilateral nonpitting edema in lower extremities, symmetrical without calf tenderness or erythema.  Lab work including CBC, BMP, BNP, VBG, troponin unremarkable.  D-dimer is negative.  EKG shows tracing similar to prior.  Chest x-ray shows stable changes with chronic cardiomegaly and congestion.  She ambulated here with a decrease in her pulse ox to initially 83% and then 88%.  I consulted the internal medicine teaching service who also evaluated the patient but feel that she is stable for discharge home now on 4 L of oxygen and following up with him in clinic tomorrow.  Patient states that she is comfortable with this plan and is able  to do so. Advised to return to ED for any severe or worsening symptoms.  Patient is hemodynamically stable, in NAD, and able to ambulate in the ED. Evaluation does not show pathology that would require ongoing emergent intervention or inpatient treatment. I explained the diagnosis to the patient. Pain has been managed and has no complaints prior to discharge. Patient is comfortable with above plan and is stable for discharge at this time. All questions were answered prior to disposition. Strict return precautions for returning to the ED were discussed. Encouraged follow up with PCP.    Portions of this note were generated with [employer]. Dictation errors may occur despite best attempts at proofreading.   Final Clinical Impressions(s) / ED Diagnoses   Final diagnoses:  COPD exacerbation [facility])    ED Discharge Orders    None       Delia Heady, PA-C 12/22/17 1802    Julianne Rice, MD 12/23/17 1114

## 2017-12-22 NOTE — Telephone Encounter (Signed)
Pt walked into our clinic with SOB, chest pains, dizziness, edema in her legs more than usual. I advised pt to go to ED to get evaluated.

## 2017-12-22 NOTE — ED Notes (Signed)
Ambulated pt on pulse ox. Pts O2 sats remained at 83% on 3L [place].

## 2017-12-22 NOTE — ED Triage Notes (Signed)
Pt presents to ED from home. Pt complains of SOB and chest pain and dizziness x2 days.

## 2017-12-29 ENCOUNTER — Other Ambulatory Visit (HOSPITAL_COMMUNITY): Payer: Self-pay

## 2017-12-29 DIAGNOSIS — I270 Primary pulmonary hypertension: Secondary | ICD-10-CM

## 2017-12-29 MED ORDER — SILDENAFIL CITRATE 20 MG PO TABS: 20.0000 mg | ORAL_TABLET | Freq: Three times a day (TID) | ORAL | 3 refills | Status: DC

## 2018-01-04 DIAGNOSIS — J984 Other disorders of lung: Secondary | ICD-10-CM | POA: Diagnosis not present

## 2018-01-04 DIAGNOSIS — I5030 Unspecified diastolic (congestive) heart failure: Secondary | ICD-10-CM | POA: Diagnosis not present

## 2018-01-04 DIAGNOSIS — I270 Primary pulmonary hypertension: Secondary | ICD-10-CM | POA: Diagnosis not present

## 2018-01-04 DIAGNOSIS — G4730 Sleep apnea, unspecified: Secondary | ICD-10-CM | POA: Diagnosis not present

## 2018-01-05 ENCOUNTER — Encounter: Payer: Self-pay | Admitting: Internal Medicine

## 2018-01-05 ENCOUNTER — Ambulatory Visit: Payer: Medicaid Other | Admitting: Internal Medicine

## 2018-01-05 ENCOUNTER — Other Ambulatory Visit: Payer: Self-pay

## 2018-01-05 VITALS — BP 108/48 | HR 50 | Temp 97.7°F | Ht 63.5 in | Wt 281.1 lb

## 2018-01-05 DIAGNOSIS — I50810 Right heart failure, unspecified: Secondary | ICD-10-CM | POA: Diagnosis not present

## 2018-01-05 DIAGNOSIS — Z79899 Other long term (current) drug therapy: Secondary | ICD-10-CM

## 2018-01-05 DIAGNOSIS — J309 Allergic rhinitis, unspecified: Secondary | ICD-10-CM | POA: Diagnosis not present

## 2018-01-05 DIAGNOSIS — K219 Gastro-esophageal reflux disease without esophagitis: Secondary | ICD-10-CM

## 2018-01-05 DIAGNOSIS — J449 Chronic obstructive pulmonary disease, unspecified: Secondary | ICD-10-CM

## 2018-01-05 DIAGNOSIS — E119 Type 2 diabetes mellitus without complications: Secondary | ICD-10-CM

## 2018-01-05 DIAGNOSIS — Z9981 Dependence on supplemental oxygen: Secondary | ICD-10-CM

## 2018-01-05 DIAGNOSIS — I272 Pulmonary hypertension, unspecified: Secondary | ICD-10-CM

## 2018-01-05 DIAGNOSIS — G3184 Mild cognitive impairment, so stated: Secondary | ICD-10-CM

## 2018-01-05 DIAGNOSIS — J9611 Chronic respiratory failure with hypoxia: Secondary | ICD-10-CM

## 2018-01-05 MED ORDER — FLUTICASONE PROPIONATE 50 MCG/ACT NA SUSP: 1.0000 | Freq: Every day | NASAL | 2 refills | Status: DC

## 2018-01-05 NOTE — Patient Instructions (Signed)
[name]  You came to [place] with complaints of shortness of breath. We believe this is worsening of your chronic pulmonary hypertension and respiratory failure. Here are our recommendations for you at discharge:  - Please try to follow up with your pulmonologist - We will give you Flonase to help with your sinus congestion  Thank you for visiting the clinic.    Fluticasone nasal spray What is this medicine? FLUTICASONE (floo TIK a sone) is a corticosteroid. This medicine is used to treat the symptoms of allergies like sneezing, itchy red eyes, and itchy, runny, or stuffy nose. This medicine is also used to treat nasal polyps. This medicine may be used for other purposes; ask your health care provider or pharmacist if you have questions. COMMON BRAND NAME(S): ClariSpray, Flonase, Flonase Allergy Relief, Flonase Sensimist, Veramyst, XHANCE What should I tell my health care provider before I take this medicine? They need to know if you have any of these conditions: -eye disease, vision problems -infection, like tuberculosis, herpes, or fungal infection -recent surgery on nose or sinuses -taking a corticosteroid by mouth -an unusual or allergic reaction to fluticasone, steroids, other medicines, foods, dyes, or preservatives -pregnant or trying to get pregnant -breast-feeding How should I use this medicine? This medicine is for use in the nose. Follow the directions on your product or prescription label. This medicine works best if used at regular intervals. Do not use more often than directed. Make sure that you are using your nasal spray correctly. After 6 months of daily use for allergies, talk to your doctor or health care professional before using it for a longer time. Ask your doctor or health care professional if you have any questions. Talk to your pediatrician regarding the use of this medicine in children. Special care may be needed. Some products have been used for allergies in  children as young as 2 years. After 2 months of daily use without a prescription in a child, talk to your pediatrician before using it for a longer time. Use of this medicine for nasal polyps is not approved in children. Overdosage: If you think you have taken too much of this medicine contact a poison control center or emergency room at once. NOTE: This medicine is only for you. Do not share this medicine with others. What if I miss a dose? If you miss a dose, use it as soon as you remember. If it is almost time for your next dose, use only that dose and continue with your regular schedule. Do not use double or extra doses. What may interact with this medicine? -certain antibiotics like clarithromycin and telithromycin -certain medicines for fungal infections like ketoconazole, itraconazole, and voriconazole -conivaptan -nefazodone -some medicines for HIV -vaccines This list may not describe all possible interactions. Give your health care provider a list of all the medicines, herbs, non-prescription drugs, or dietary supplements you use. Also tell them if you smoke, drink alcohol, or use illegal drugs. Some items may interact with your medicine. What should I watch for while using this medicine? Visit your healthcare professional for regular checks on your progress. Tell your healthcare professional if your symptoms do not start to get better or if they get worse. This medicine may increase your risk of getting an infection. Tell your doctor or health care professional if you are around anyone with measles or chickenpox, or if you develop sores or blisters that do not heal properly. What side effects may I notice from receiving this medicine? Side  effects that you should report to your doctor or health care professional as soon as possible: -allergic reactions like skin rash, itching or hives, swelling of the face, lips, or tongue -changes in vision -crusting or sores in the  nose -nosebleed -signs and symptoms of infection like fever or chills; cough; sore throat -white patches or sores in the mouth or nose Side effects that usually do not require medical attention (report to your doctor or health care professional if they continue or are bothersome): -burning or irritation inside the nose or throat -changes in taste or smell -cough -headache This list may not describe all possible side effects. Call your doctor for medical advice about side effects. You may report side effects to FDA at 1-800-FDA-1088. Where should I keep my medicine? Keep out of the reach of children. Store at room temperature between 15 and 30 degrees C (59 and 86 degrees F). Avoid exposure to extreme heat, cold, or light. Throw away any unused medicine after the expiration date. NOTE: This sheet is a summary. It may not cover all possible information. If you have questions about this medicine, talk to your doctor, pharmacist, or health care provider.  2019 Elsevier/Gold Standard (2017-01-13 14:10:08)

## 2018-01-05 NOTE — Progress Notes (Signed)
CC: Dyspnea  HPI: [name] is a 58 y.o. w/ PMH of COPD, Cognitive impairment, Pulmonary hypertension, Right sided heart failure, GERD and T2DM after an ED visit.  She had a emergency department visit on 12/22/2017 when she was evaluated by me and was thought to have exacerbation of her chronic respiratory failure and was discharged home with recommendation for follow-up visit at the clinic.  She states that she was unable to come back to clinic until today.  Since the ED visit her dyspnea and lower extremity swelling has improved.  Denies any chest pain palpitations worsening orthopnea.  She does mentions that she continues to have increased oxygen requirement at 4 L nasal cannula as opposed to 3 L prior to December.  She also mentions her dyspnea is exacerbated by her sinus congestion.  Had improvement with Flonase in the past but currently does not have any more Flonase.  She mentions that she has a follow-up appointment with her pulmonologist coming up in April or May.  She denies any fevers/ chills/ nausea/ vomiting/ diarrhea/ constipation.  Continues to endorse intermittent dry cough especially in the morning.  Past Medical History:  Diagnosis Date  . Anemia, iron deficiency    secondary to menhorrhagia, on oral iron, also b12 def, getting monthly b12 shots  . CHF (congestive heart failure) ([place])   . Chronic cough    secondary to alleriges and post nasal drip  . Cognitive impairment   . COPD (chronic obstructive pulmonary disease) ([place])   . Cor pulmonale (HCC)    PA Peak pressure 74mHg  . Depression   . Diabetes mellitus    well controlled on metformin  . Diastolic heart failure ([place]   . GERD (gastroesophageal reflux disease)   . H/O mental retardation   . Herpes   . Hyperlipidemia   . Hypertension   . OSA (obstructive sleep apnea)    CPAP  . Pulmonary hypertension ([place]   . Renal disorder   . Restrictive lung disease    PFTs 06/2012 (FVC 54% predicted and FEV1 68%  predicted w minimal bronchodilator response).  . Shortness of breath   . Venous stasis ulcer ([place]    chornic, ?followed up at wound care center, multiple courses of antibiotics in past for cellulitis, on lasix   Review of Systems: Review of Systems  Constitutional: Negative for chills, fever and malaise/fatigue.  Respiratory: Positive for cough and shortness of breath. Negative for sputum production and wheezing.   Cardiovascular: Negative for chest pain and palpitations.  Gastrointestinal: Negative for constipation, diarrhea, nausea and vomiting.  Neurological: Negative for dizziness.    Physical Exam: Vitals:   01/05/18 1010  BP: (!) 108/48  Pulse: (!) 50  Temp: 97.7 F (36.5 C)  TempSrc: Oral  SpO2: 94%  Weight: 281 lb 1.6 oz (127.5 kg)  Height: 5' 3.5" (1.613 m)   Physical Exam  Constitutional: She appears well-developed and well-nourished. No distress.  Obese  HENT:  Mouth/Throat: Oropharynx is clear and moist. No oropharyngeal exudate.  O2 [place] in place and functioning. Erythematous nasal turbinates.   Neck: Normal range of motion. Neck supple. No JVD present.  Cardiovascular: Normal rate, regular rhythm, normal heart sounds and intact distal pulses.  Respiratory: Effort normal. No respiratory distress. She has no wheezes. She has rales (mild bibasilar rales).  GI: Soft. Bowel sounds are normal. She exhibits no distension. There is no abdominal tenderness.  Musculoskeletal: Normal range of motion.  General: Edema (2+ pitting edema lower extremities with thickened skin changes on lower extremities up to mid shin) present.    Assessment & Plan:   Pulmonary hypertension ([place]) Present with worsening chronic hypoxic respiratory failure with increasing supplemental oxygen requirement. Satting 94 on clinic on 4L [place] today. On prior ED visit had negative BNP, D-dimer, lobar consolidation on X-ray. Appears to be worsening of chronic disease and not an acute process. Follows  w/ pulm Dr.Fortin at [facility] on treprostinil therapy. Next appt 6 months away  - Recommend reaching out to [facility] for earlier appt.  Allergic rhinitis Complaints of sinus congestion interfering with respiratory comfort. Endorse post-nasal drip and dry cough in am. States currently on antihistamine therapy with benadril but sinus congestion not improved. States prior improvement with Flonase but difficulty paying for OTC cost.  - Given Flonase prescription with discount card.    Patient discussed with Dr. Dareen Piano   -Gilberto Better, PGY1

## 2018-01-05 NOTE — Assessment & Plan Note (Signed)
Present with worsening chronic hypoxic respiratory failure with increasing supplemental oxygen requirement. Satting 94 on clinic on 4L [place] today. On prior ED visit had negative BNP, D-dimer, lobar consolidation on X-ray. Appears to be worsening of chronic disease and not an acute process. Follows w/ pulm Dr.Fortin at [facility] on treprostinil therapy. Next appt 6 months away  - Recommend reaching out to [facility] for earlier appt.

## 2018-01-05 NOTE — Assessment & Plan Note (Signed)
Complaints of sinus congestion interfering with respiratory comfort. Endorse post-nasal drip and dry cough in am. States currently on antihistamine therapy with benadril but sinus congestion not improved. States prior improvement with Flonase but difficulty paying for OTC cost.  - Given Flonase prescription with discount card.

## 2018-01-06 NOTE — Progress Notes (Signed)
Internal Medicine Clinic Attending ° °Case discussed with Dr. Lee at the time of the visit.  We reviewed the resident’s history and exam and pertinent patient test results.  I agree with the assessment, diagnosis, and plan of care documented in the resident’s note.  °

## 2018-01-19 ENCOUNTER — Ambulatory Visit: Payer: Self-pay | Admitting: Dietician

## 2018-01-25 DIAGNOSIS — I272 Pulmonary hypertension, unspecified: Secondary | ICD-10-CM | POA: Diagnosis not present

## 2018-01-25 DIAGNOSIS — R0609 Other forms of dyspnea: Secondary | ICD-10-CM | POA: Diagnosis not present

## 2018-02-04 DIAGNOSIS — I270 Primary pulmonary hypertension: Secondary | ICD-10-CM | POA: Diagnosis not present

## 2018-02-04 DIAGNOSIS — G4730 Sleep apnea, unspecified: Secondary | ICD-10-CM | POA: Diagnosis not present

## 2018-02-04 DIAGNOSIS — I5030 Unspecified diastolic (congestive) heart failure: Secondary | ICD-10-CM | POA: Diagnosis not present

## 2018-02-04 DIAGNOSIS — J984 Other disorders of lung: Secondary | ICD-10-CM | POA: Diagnosis not present

## 2018-02-16 ENCOUNTER — Other Ambulatory Visit: Payer: Self-pay

## 2018-02-16 ENCOUNTER — Encounter (HOSPITAL_COMMUNITY): Payer: Self-pay

## 2018-02-16 ENCOUNTER — Ambulatory Visit (HOSPITAL_COMMUNITY)
Admission: RE | Admit: 2018-02-16 | Discharge: 2018-02-16 | Disposition: A | Discharge status: Home / Self Care | Payer: Medicaid Other | Source: Ambulatory Visit | Attending: Cardiology | Admitting: Cardiology

## 2018-02-16 VITALS — BP 110/58 | HR 62

## 2018-02-16 DIAGNOSIS — I270 Primary pulmonary hypertension: Secondary | ICD-10-CM

## 2018-02-16 DIAGNOSIS — I4892 Unspecified atrial flutter: Secondary | ICD-10-CM | POA: Diagnosis not present

## 2018-02-16 DIAGNOSIS — E119 Type 2 diabetes mellitus without complications: Secondary | ICD-10-CM | POA: Diagnosis not present

## 2018-02-16 DIAGNOSIS — R42 Dizziness and giddiness: Secondary | ICD-10-CM | POA: Insufficient documentation

## 2018-02-16 DIAGNOSIS — I480 Paroxysmal atrial fibrillation: Secondary | ICD-10-CM | POA: Diagnosis not present

## 2018-02-16 DIAGNOSIS — J449 Chronic obstructive pulmonary disease, unspecified: Secondary | ICD-10-CM | POA: Diagnosis not present

## 2018-02-16 DIAGNOSIS — I2721 Secondary pulmonary arterial hypertension: Secondary | ICD-10-CM | POA: Diagnosis not present

## 2018-02-16 DIAGNOSIS — R0602 Shortness of breath: Secondary | ICD-10-CM | POA: Insufficient documentation

## 2018-02-16 DIAGNOSIS — J984 Other disorders of lung: Secondary | ICD-10-CM | POA: Diagnosis not present

## 2018-02-16 DIAGNOSIS — Z7984 Long term (current) use of oral hypoglycemic drugs: Secondary | ICD-10-CM | POA: Diagnosis not present

## 2018-02-16 DIAGNOSIS — D509 Iron deficiency anemia, unspecified: Secondary | ICD-10-CM | POA: Insufficient documentation

## 2018-02-16 DIAGNOSIS — F79 Unspecified intellectual disabilities: Secondary | ICD-10-CM | POA: Diagnosis not present

## 2018-02-16 DIAGNOSIS — K219 Gastro-esophageal reflux disease without esophagitis: Secondary | ICD-10-CM | POA: Insufficient documentation

## 2018-02-16 DIAGNOSIS — E785 Hyperlipidemia, unspecified: Secondary | ICD-10-CM | POA: Insufficient documentation

## 2018-02-16 DIAGNOSIS — I110 Hypertensive heart disease with heart failure: Secondary | ICD-10-CM | POA: Diagnosis not present

## 2018-02-16 DIAGNOSIS — Z7901 Long term (current) use of anticoagulants: Secondary | ICD-10-CM | POA: Diagnosis not present

## 2018-02-16 DIAGNOSIS — G4733 Obstructive sleep apnea (adult) (pediatric): Secondary | ICD-10-CM | POA: Diagnosis not present

## 2018-02-16 DIAGNOSIS — I5032 Chronic diastolic (congestive) heart failure: Secondary | ICD-10-CM | POA: Insufficient documentation

## 2018-02-16 DIAGNOSIS — R05 Cough: Secondary | ICD-10-CM | POA: Insufficient documentation

## 2018-02-16 DIAGNOSIS — Z79899 Other long term (current) drug therapy: Secondary | ICD-10-CM | POA: Insufficient documentation

## 2018-02-16 DIAGNOSIS — I2781 Cor pulmonale (chronic): Secondary | ICD-10-CM | POA: Insufficient documentation

## 2018-02-16 DIAGNOSIS — J069 Acute upper respiratory infection, unspecified: Secondary | ICD-10-CM | POA: Diagnosis not present

## 2018-02-16 LAB — CBC
HCT: 49.7 % — ABNORMAL HIGH (ref 36.0–46.0)
Hemoglobin: 15 g/dL (ref 12.0–15.0)
MCH: 27.2 pg (ref 26.0–34.0)
MCHC: 30.2 g/dL (ref 30.0–36.0)
MCV: 90.2 fL (ref 80.0–100.0)
NRBC: 0 % (ref 0.0–0.2)
Platelets: 369 10*3/uL (ref 150–400)
RBC: 5.51 MIL/uL — ABNORMAL HIGH (ref 3.87–5.11)
RDW: 19.4 % — AB (ref 11.5–15.5)
WBC: 8.3 10*3/uL (ref 4.0–10.5)

## 2018-02-16 LAB — BASIC METABOLIC PANEL
Anion gap: 8 (ref 5–15)
CALCIUM: 8.5 mg/dL — AB (ref 8.9–10.3)
CHLORIDE: 108 mmol/L (ref 98–111)
CO2: 24 mmol/L (ref 22–32)
CREATININE: 0.81 mg/dL (ref 0.44–1.00)
GFR calc Af Amer: >60 mL/min (ref >60)
GFR calc non Af Amer: >60 mL/min (ref >60)
GLUCOSE: 110 mg/dL — AB (ref 70–99)
Potassium: 4.8 mmol/L (ref 3.5–5.1)
Sodium: 140 mmol/L (ref 135–145)

## 2018-02-16 MED ORDER — DOXYCYCLINE HYCLATE 100 MG PO TABS: 100.0000 mg | ORAL_TABLET | Freq: Two times a day (BID) | ORAL | 0 refills | Status: DC

## 2018-02-16 NOTE — Patient Instructions (Signed)
TAKE Doxycyline 141m twice daily.  Lab work today We will call you if labs are ABNORMAL.  Follow up in 3-4 months with Dr.Bensimhon

## 2018-02-16 NOTE — Progress Notes (Signed)
[name], female   DOB: May 26, 1960, 58 y.o.   MRN: [id number]    [name], female   DOB: 01-02-1961, 58 y.o.    [facility] Cardiologist: Dr Kelby Aline    HPI: [name] is a 58 y.o. 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   She was admitted to [facility] in July 2014 with respiratory failure and RHC showed severe PAH with pulmonary pressures in the 90s. She was transferred to [facility] where she was started on IV Remodulin with excellent results.  She was admitted again 08/16/12 for CP and tachycardia, was found to be in atypical flutter vs atrial tach. Amiodarone was considered but she converted spontaneously. She was started on Xarelto. Her discharge weight was 215 lbs.    Had CP in 11/15 and Myoview with EF 78% with reversible defect in distal anterior wall and apex. (ischemia vs breast attenuation) EF 78%  She presents today as an add on for cough. Last seen by Dr. Gilles Chiquito 01/25/2018. Treated with Levaquin for URI. She had some relief but it has not completely gone away, and is worsening slightly again. She has intermittent production of yellow/white sputum. She has chronic, intermittent SOB with mild exertion, that has been slightly worse over the past month with this cough. She has chronic peripheral edema that she states is no worse. Denies PND. Chronically sleeps on 3 pillows. Wearing CPAP qhs and 3L of O2 with activity. Occasional dizziness when she is SOB. Trying to watch her salt and fluid intake closer. She is not weighting at home. Managing her Remodulin cartridges at home by herself. Her son occasionally helps.   Studies: RHC 1/18 RA = 7 RV = 72/9 PA = 70/31 (47) PCW = 11 Fick cardiac output/index = 7.4/3.3 PVR =  4.9 WU FA sat = 90% PA sat = 69%, 68%  Cath 2/16: Minimal CAD LAD 30% otherwise normal RHC on Remodulin RA = 8 RV = 45/5/8 PA = 52/24 (34) PCW = 11 Fick cardiac output/index = 7.6/3.6 PVR = 3.0 WU FA sat = 99% PA sat = 70%, 71%  ECHO 08/11/12 EF 60-65% RV moderately dilated Peak PA pressure 48 mmhg  ECHO 02/28/13 EF 55-60% Grade I DD RV severely dilated and HK. Trivial TR. Septum flat Peak PA pressure 53 mmhg ECHO 11/15 EF 55-60% RV normal Trivial TR ECHO 1/16 at [facility] EF >55% RV mildly dilated (improved function) Trivial TR normal IVC ECHO 11/2014: EF 55-60%.  RV modrately dilated. Peak PA pressure 78 mm hg ECHO 03/2015: At [facility] RV severely dilated. EF >55% [facility]SP 53mHG [facility]  [facility] Room air sat was 96% sats fell briefly to 89 at the very end of the walk she went 341 m this is 82% predicted  6MWD 9/17 was 362 m  This is 88% predicted on 3 L oxygen saturations did not fall below 93%.   6MWD 3/18 3438m Labs        09/29/12: K+ 3.6, Creatinine 0.87       02/12/13 K 3.4 Creatinine 0.67 ---> given extra potassium and started on 12.5 mg spironolactone.         05/17/2013  K 4.3 Creatinine 0.68  11/15  K 3.4 Cr 0.67       2/16    K 4.1 Cr 0.78       9/16:   K 3.8 Cr 1.08         02/18/2015: K 4.3 Creatinine 0.64           Review of systems complete and found to be negative unless listed in HPI.    Past Medical History:  Diagnosis Date  . Anemia, iron deficiency    secondary to menhorrhagia, on oral iron, also b12 def, getting monthly b12 shots  . CHF (congestive heart failure) ([place])   . Chronic cough    secondary to alleriges and post nasal drip  . Cognitive impairment   . COPD (chronic obstructive pulmonary disease) ([place])   . Cor pulmonale (HCC)    PA Peak pressure 2mHg  . Depression   . Diabetes mellitus    well controlled on metformin  . Diastolic heart failure ([place]   . GERD (gastroesophageal reflux disease)   .  H/O mental retardation   . Herpes   . Hyperlipidemia   . Hypertension   . OSA (obstructive sleep apnea)    CPAP  . Pulmonary hypertension ([place]   . Renal disorder   . Restrictive lung disease    PFTs 06/2012 (FVC 54% predicted and FEV1 68% predicted w minimal bronchodilator response).  . Shortness of breath   . Venous stasis ulcer ([place]    chornic, ?followed up at wound care center, multiple courses of antibiotics in past for cellulitis, on lasix    Current Outpatient Medications  Medication Sig Dispense Refill  . acetaminophen (TYLENOL) 500 MG tablet Take 1 tablet (500 mg total) by mouth every 6 (six) hours as needed. (Patient taking differently: Take 500 mg by mouth every 6 (six) hours as needed for mild pain. ) 30 tablet 0  . cetirizine (ZYRTEC) 10 MG tablet TAKE ONE TABLET BY MOUTH ONCE DAILY 30 tablet 1  . cyanocobalamin 500 MCG tablet Take 500 mcg by mouth daily.    . diphenhydrAMINE (BENADRYL) 25 mg capsule Take 25-75 mg by mouth 2 (two) times daily as needed for itching.     . ferrous sulfate 325 (65 FE) MG tablet Take 1 tablet (325 mg total) by mouth daily with breakfast. 90 tablet 3  . fluticasone (FLONASE) 50 MCG/ACT nasal spray Place 1 spray into both nostrils daily. 16 g 2  . gabapentin (NEURONTIN) 300 MG capsule TAKE 2 CAPSULES BY MOUTH THREE TIMES DAILY 540 capsule 4  . glucose blood (ACCU-CHEK AVIVA PLUS) test strip Use one strip to check glucose once daily. Diagnosis code E11.42 100 each 3  . ibuprofen (ADVIL,MOTRIN) 200 MG tablet Take 600 mg by mouth every 6 (six) hours as needed for moderate pain.    . metFORMIN (GLUCOPHAGE) 500 MG tablet TAKE ONE-HALF TABLET BY MOUTH WITH BREAKFAST 90 tablet 2  . mupirocin ointment (BACTROBAN) 2 % Place 1 application into the nose 2 (two) times daily. (Patient not taking: Reported on 09/29/2017) 22 g 0  . nystatin cream (MYCOSTATIN) Apply 1 application topically 2 (two) times daily. (Patient taking differently: Apply 1 application  topically 2 (two) times daily as needed for dry skin. ) 30 g 1  . pantoprazole (PROTONIX) 20 MG tablet Take 1 tablet (20 mg total) by mouth 2 (two) times daily. 180 tablet 11  . potassium chloride SA (K-DUR,KLOR-CON) 20 MEQ tablet TAKE 2 TABLETS BY MOUTH THREE TIMES DAILY TAKE  AN  EXTRA  TABLET  WHEN  TAKING  AN  EXTRA  TORSEMIDE 180 tablet 3  . pravastatin (PRAVACHOL) 20 MG tablet TAKE 1 TABLET BY MOUTH ONCE DAILY 90 tablet 1  . rivaroxaban (XARELTO) 20 MG TABS tablet Take 1 tablet (20 mg total) by mouth daily with supper. 90 tablet 3  . sildenafil (REVATIO) 20 MG tablet Take 1 tablet (20 mg total) by mouth 3 (three) times daily. 270 tablet 3  . sodium chloride (OCEAN) 0.65 % SOLN nasal spray Place 2 sprays into both nostrils as needed for congestion. 30 mL 0  . spironolactone (ALDACTONE) 25 MG tablet Take 0.5 tablets (12.5 mg total) by mouth daily. 90 tablet 1  . torsemide (DEMADEX) 20 MG tablet Take 2 tablets (40 mg total) by mouth 2 (two) times daily. 360 tablet 2  . treprostinil (REMODULIN) 5 MG/ML SOLN injection Inject 6 ng/mL into the skin continuous.      No current facility-administered medications for this visit.    Vitals:   02/16/18 1101  BP: (!) 110/58  Pulse: 62  SpO2: 94%   Wt Readings from Last 3 Encounters:  01/05/18 127.5 kg (281 lb 1.6 oz)  12/22/17 127 kg (280 lb)  11/24/17 127 kg (279 lb 14.4 oz)   Physical Exam:  General:Obese.  HEENT: Normal x for flushing.  Neck: Supple. JVP difficult to assess. Carotids 2+ bilat; no bruits. No thyromegaly or nodule noted. Cor: PMI nondisplaced. RRR, + prominent P2. + R chest hickman.  Lungs: CTAB, normal effort. On 3L O2 via .  Abdomen: Soft, non-tender, non-distended, no HSM. No bruits or masses. +BS  Extremities: No cyanosis, clubbing, or rash. R and LLE no edema.  Neuro: Alert & orientedx3, cranial nerves grossly intact. moves all 4 extremities w/o difficulty. Affect pleasant   ASSESSMENT & PLAN:  1) Chronic  diastolic HF/cor pulmonale:  - NYHA III-IIIb symptoms chronically. Volume status looks OK on exam.   - Continue torsemide 80 mg daily. - Continue spiro 12.5 mg daily - No changes in setting of illness.  - Reinforced fluid restriction to < 2 L daily, sodium restriction to less than 2000 mg daily, and the importance of daily weights.    2) PAH, severe with cor pulmonale.  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 However PAH far out of proportion to L-sided pressures or restrictive lung disease. - Follows with [facility]. Last seen 10/2017 - Continue plan.  - Remains on Remodulin + sildenafil.   3) Paroxysmal atrial flutter.  - Continue Xarelto. Denies bleeding.  4) Obesity - Did not want to stand for weight today.  - Discussed portion control. Seeing a dietician 5) URI - Responded well to Doxycycline in November, and just finished levaquin - Will give a course of Doxy 100 mg BID x 10 days. Discussed dosing with Pharm-D. Continue inhaler as given by Dr. Gilles Chiquito.   Shirley Friar, PA-C  9:36 AM   Greater than 50% of the 25 minute visit was spent in counseling/coordination of care regarding disease state education, salt/fluid restriction, sliding scale diuretics, and medication compliance.

## 2018-02-20 ENCOUNTER — Other Ambulatory Visit: Payer: Self-pay | Admitting: Internal Medicine

## 2018-02-20 NOTE — Telephone Encounter (Signed)
refilled 

## 2018-02-23 ENCOUNTER — Ambulatory Visit (INDEPENDENT_AMBULATORY_CARE_PROVIDER_SITE_OTHER): Payer: Medicaid Other | Admitting: Internal Medicine

## 2018-02-23 ENCOUNTER — Encounter: Payer: Self-pay | Admitting: Dietician

## 2018-02-23 ENCOUNTER — Other Ambulatory Visit: Payer: Self-pay

## 2018-02-23 ENCOUNTER — Ambulatory Visit: Payer: Medicaid Other | Admitting: Dietician

## 2018-02-23 VITALS — BP 111/55 | HR 51 | Temp 97.9°F | Ht 63.0 in | Wt 285.6 lb

## 2018-02-23 DIAGNOSIS — Z792 Long term (current) use of antibiotics: Secondary | ICD-10-CM | POA: Diagnosis not present

## 2018-02-23 DIAGNOSIS — I272 Pulmonary hypertension, unspecified: Secondary | ICD-10-CM

## 2018-02-23 DIAGNOSIS — R0981 Nasal congestion: Secondary | ICD-10-CM

## 2018-02-23 DIAGNOSIS — R220 Localized swelling, mass and lump, head: Secondary | ICD-10-CM | POA: Diagnosis not present

## 2018-02-23 DIAGNOSIS — J9611 Chronic respiratory failure with hypoxia: Secondary | ICD-10-CM | POA: Diagnosis not present

## 2018-02-23 DIAGNOSIS — I4892 Unspecified atrial flutter: Secondary | ICD-10-CM

## 2018-02-23 DIAGNOSIS — R05 Cough: Secondary | ICD-10-CM | POA: Diagnosis not present

## 2018-02-23 DIAGNOSIS — Z9981 Dependence on supplemental oxygen: Secondary | ICD-10-CM | POA: Diagnosis not present

## 2018-02-23 DIAGNOSIS — I5032 Chronic diastolic (congestive) heart failure: Secondary | ICD-10-CM | POA: Diagnosis not present

## 2018-02-23 DIAGNOSIS — E1142 Type 2 diabetes mellitus with diabetic polyneuropathy: Secondary | ICD-10-CM

## 2018-02-23 DIAGNOSIS — Z79899 Other long term (current) drug therapy: Secondary | ICD-10-CM

## 2018-02-23 DIAGNOSIS — J069 Acute upper respiratory infection, unspecified: Secondary | ICD-10-CM

## 2018-02-23 MED ORDER — CETIRIZINE HCL 10 MG PO TABS: 10.0000 mg | ORAL_TABLET | Freq: Every day | ORAL | 1 refills | Status: DC

## 2018-02-23 NOTE — Progress Notes (Signed)
CC: Under-eye Swelling:  HPI:  [name] is a 58 y.o. female with chronic diastolic CHF, pulmonary hypertension, chronic hypoxic respiratory failure on 3L supplemental oxygen, and atrial flutter who presents with under-eye swelling  Cough, Congestion, under-eye swelling: She reports feeling well until 3 weeks ago when she began to have a productive cough and congestion. She also began to notice an bilateral under-eye swelling. She denies worsening SOB or increase supplemental oxygen requirement. She was seen by her cardiologist 1 week ago who prescribed Doxycycline 100 mg BID x 10 days. She finally started taking the antibiotic yesterday.   Chronic Diastolic Heart Failure: She stopped taking all of her medications including Torsemide and spironolactone 2.5 weeks ago. She states that after she became ill she began forgetting her medications. She restarted these medications yesterday. Per chart review--she was seen by her cardiologist 1 week ago who noted that she was euvolemic on exam and recommended continuing her current dose of Torsemide 80 mg daily and Spironolactone 12.5 mg daily.   Past Medical History:  Diagnosis Date  . Anemia, iron deficiency    secondary to menhorrhagia, on oral iron, also b12 def, getting monthly b12 shots  . CHF (congestive heart failure) ([place])   . Chronic cough    secondary to alleriges and post nasal drip  . Cognitive impairment   . COPD (chronic obstructive pulmonary disease) ([place])   . Cor pulmonale (HCC)    PA Peak pressure 39mHg  . Depression   . Diabetes mellitus    well controlled on metformin  . Diastolic heart failure ([place]   . GERD (gastroesophageal reflux disease)   . H/O mental retardation   . Herpes   . Hyperlipidemia   . Hypertension   . OSA (obstructive sleep apnea)    CPAP  . Pulmonary hypertension ([place]   . Renal disorder   . Restrictive lung disease    PFTs 06/2012 (FVC 54% predicted and FEV1 68% predicted w minimal  bronchodilator response).  . Shortness of breath   . Venous stasis ulcer ([place]    chornic, ?followed up at wound care center, multiple courses of antibiotics in past for cellulitis, on lasix   Review of Systems:   Review of Systems  Constitutional: Negative for chills, fever and malaise/fatigue.  HENT: Positive for congestion. Negative for sore throat.   Respiratory: Positive for cough and shortness of breath.   Cardiovascular: Positive for leg swelling. Negative for chest pain, palpitations and orthopnea.  Gastrointestinal: Negative for abdominal pain, diarrhea, nausea and vomiting.  Genitourinary: Negative for dysuria, frequency, hematuria and urgency.  Neurological: Negative for dizziness and weakness.  All other systems reviewed and are negative.   Physical Exam:  Vitals:   02/23/18 1123  BP: (!) 111/55  Pulse: (!) 51  Temp: 97.9 F (36.6 C)  TempSrc: Oral  SpO2: 91%  Weight: 285 lb 9.6 oz (129.5 kg)  Height: _0  (1.6 m)   Physical Exam Vitals signs reviewed.  Constitutional:      Appearance: Normal appearance.  HENT:     Head: Normocephalic and atraumatic.     Nose: Congestion and rhinorrhea present.     Mouth/Throat:     Mouth: Mucous membranes are moist.     Pharynx: Oropharynx is clear. No oropharyngeal exudate or posterior oropharyngeal erythema.  Cardiovascular:     Rate and Rhythm: Normal rate and regular rhythm.     Pulses: Normal pulses.     Heart sounds: Normal heart sounds.  Pulmonary:  Comments: Effort normal. Normal oxygen saturation on 3 L nasal cannula. Course breath sounds bilaterally. No crackles, rhonchi, or wheezes appreciated.  Musculoskeletal:     Comments: 2+ pitting edema of LEs bilaterally. Non-tender to palpation. No warmth.  Skin:    General: Skin is warm and dry.  Neurological:     Mental Status: She is alert and oriented to person, place, and time. Mental status is at baseline.  Psychiatric:        Mood and Affect: Mood normal.         Behavior: Behavior normal.     Assessment & Plan:   See Encounters Tab for problem based charting.  Patient discussed with Dr. Daryll Drown

## 2018-02-23 NOTE — Progress Notes (Signed)
Diabetes Group meeting Documentation:  start time:11:00 AM   end time: 70 AM  [name] attended diabetes education group visit today for 15 minutes. The program included successes and challenges over the past few weeks. We discussed healthy foods to choose when sick. Our activity was putting together sick day bag and tasting food demo soup.  Plan: attend diabetes group meeting next month  [name], RD 02/23/2018 12:33 PM.

## 2018-02-23 NOTE — Patient Instructions (Addendum)
Keep taking doxycycline antibiotic for the next 10 days.  I have sent in a prescription for Cetirizine 10 mg daily. Please take this everyday.  Keep using your Flonase and benadryl as needed.

## 2018-02-23 NOTE — Patient Instructions (Signed)
Will see you March 19 for next diabetes group meeting.   [name] [phone]

## 2018-02-25 DIAGNOSIS — J069 Acute upper respiratory infection, unspecified: Secondary | ICD-10-CM

## 2018-02-25 NOTE — Assessment & Plan Note (Addendum)
Assessment: She appears euvolemic on exam without signs of pulmonary edema. She is saturating well on her baseline 3 L supplemental oxygen and has a stable weight from last clinic visit. She does have bilateral LE edema but I believe that this will improve with continued Torsemide use.   Plan: 1. Continue Torsemide 80 mg daily 2. Continue Spironolactone 12.5 mg daily 3. Recommend compression stockings.

## 2018-02-25 NOTE — Assessment & Plan Note (Signed)
Assessment: [name]'s symptoms of cough, congestion, and under-eye swelling are consistent with an upper respiratory infection. I have asked her to continue the previously prescribed doxycycline. I will also prescribe cetirizine to help with her under-eye circles as this is her main complaint today.  Plan: 1. Continue doxycycline 2. Start cetirizine 10 mg daily

## 2018-02-27 NOTE — Progress Notes (Signed)
Internal Medicine Clinic Attending  Case discussed with Dr. Alfonse Spruce at the time of the visit.  We reviewed the resident's history and exam and pertinent patient test results.  I agree with the assessment, diagnosis, and plan of care documented in the resident's note.

## 2018-03-05 DIAGNOSIS — I5030 Unspecified diastolic (congestive) heart failure: Secondary | ICD-10-CM | POA: Diagnosis not present

## 2018-03-05 DIAGNOSIS — I270 Primary pulmonary hypertension: Secondary | ICD-10-CM | POA: Diagnosis not present

## 2018-03-05 DIAGNOSIS — G4730 Sleep apnea, unspecified: Secondary | ICD-10-CM | POA: Diagnosis not present

## 2018-03-05 DIAGNOSIS — J984 Other disorders of lung: Secondary | ICD-10-CM | POA: Diagnosis not present

## 2018-03-21 ENCOUNTER — Telehealth: Payer: Self-pay | Admitting: Dietician

## 2018-03-21 NOTE — Telephone Encounter (Signed)
Spoke with patient about appointment cancellations. Answered their questions and concerns about diet and nutrition for weight loss.  [name] Abundio Teuscher, RD 03/21/2018 11:12 AM.

## 2018-03-23 ENCOUNTER — Ambulatory Visit: Payer: Self-pay

## 2018-03-23 ENCOUNTER — Telehealth: Payer: Self-pay | Admitting: *Deleted

## 2018-03-23 ENCOUNTER — Ambulatory Visit: Payer: Self-pay | Admitting: Dietician

## 2018-03-23 NOTE — Telephone Encounter (Signed)
Agree with plan.  Discussed with Lauren at length and reviewed patient chart.  Possible line infection based on symptoms.  She has Hickman catheter in for IV medications for her pulmonary disease.

## 2018-03-23 NOTE — Telephone Encounter (Signed)
Patient called in stating she had blood on her pajama top yesterday and didn't know where it was coming from. Today there is small amount of blood in her "chest tube" and it is cloudy. Denies fever. States breathing is same as it usually is; remains on 3L of oxygen. Discussed with Attending (Dr. Daryll Drown) and patient instructed to call pulmonary office for possible infection of hickman catheter. Patient states she will call as soon as we hang up. Hubbard Hartshorn, RN, BSN

## 2018-03-24 DIAGNOSIS — Z452 Encounter for adjustment and management of vascular access device: Secondary | ICD-10-CM | POA: Diagnosis not present

## 2018-03-24 DIAGNOSIS — I272 Pulmonary hypertension, unspecified: Secondary | ICD-10-CM | POA: Diagnosis not present

## 2018-03-24 DIAGNOSIS — Y838 Other surgical procedures as the cause of abnormal reaction of the patient, or of later complication, without mention of misadventure at the time of the procedure: Secondary | ICD-10-CM | POA: Diagnosis not present

## 2018-03-24 DIAGNOSIS — Y832 Surgical operation with anastomosis, bypass or graft as the cause of abnormal reaction of the patient, or of later complication, without mention of misadventure at the time of the procedure: Secondary | ICD-10-CM | POA: Diagnosis not present

## 2018-03-24 DIAGNOSIS — T82898A Other specified complication of vascular prosthetic devices, implants and grafts, initial encounter: Secondary | ICD-10-CM | POA: Diagnosis not present

## 2018-03-24 DIAGNOSIS — T82514A Breakdown (mechanical) of infusion catheter, initial encounter: Secondary | ICD-10-CM | POA: Diagnosis not present

## 2018-04-03 DIAGNOSIS — G4730 Sleep apnea, unspecified: Secondary | ICD-10-CM | POA: Diagnosis not present

## 2018-04-03 DIAGNOSIS — I270 Primary pulmonary hypertension: Secondary | ICD-10-CM | POA: Diagnosis not present

## 2018-04-03 DIAGNOSIS — G4733 Obstructive sleep apnea (adult) (pediatric): Secondary | ICD-10-CM | POA: Diagnosis not present

## 2018-04-05 DIAGNOSIS — I5030 Unspecified diastolic (congestive) heart failure: Secondary | ICD-10-CM | POA: Diagnosis not present

## 2018-04-05 DIAGNOSIS — G4730 Sleep apnea, unspecified: Secondary | ICD-10-CM | POA: Diagnosis not present

## 2018-04-05 DIAGNOSIS — I270 Primary pulmonary hypertension: Secondary | ICD-10-CM | POA: Diagnosis not present

## 2018-04-05 DIAGNOSIS — J984 Other disorders of lung: Secondary | ICD-10-CM | POA: Diagnosis not present

## 2018-04-06 IMAGING — DX DG THORACIC SPINE 2V
3 series · 3 of 3 positions shown · non-contrast
Comparison: 06/25/2013

CLINICAL DATA: Fall

EXAM:
THORACIC SPINE 2 VIEWS

[t thoracic spine ap]
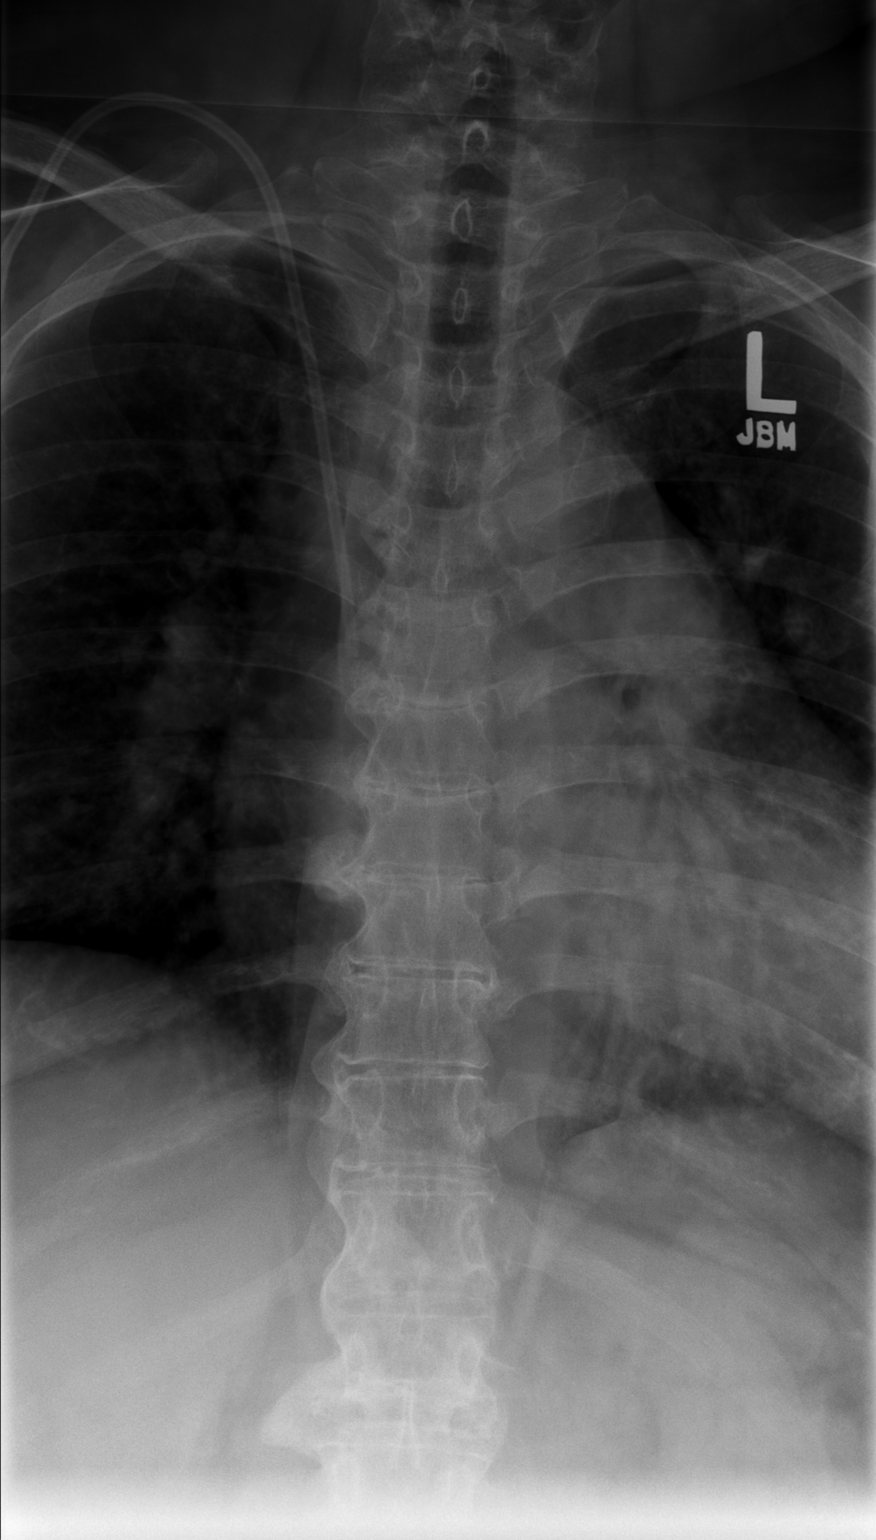

[t thoracic spine lat]
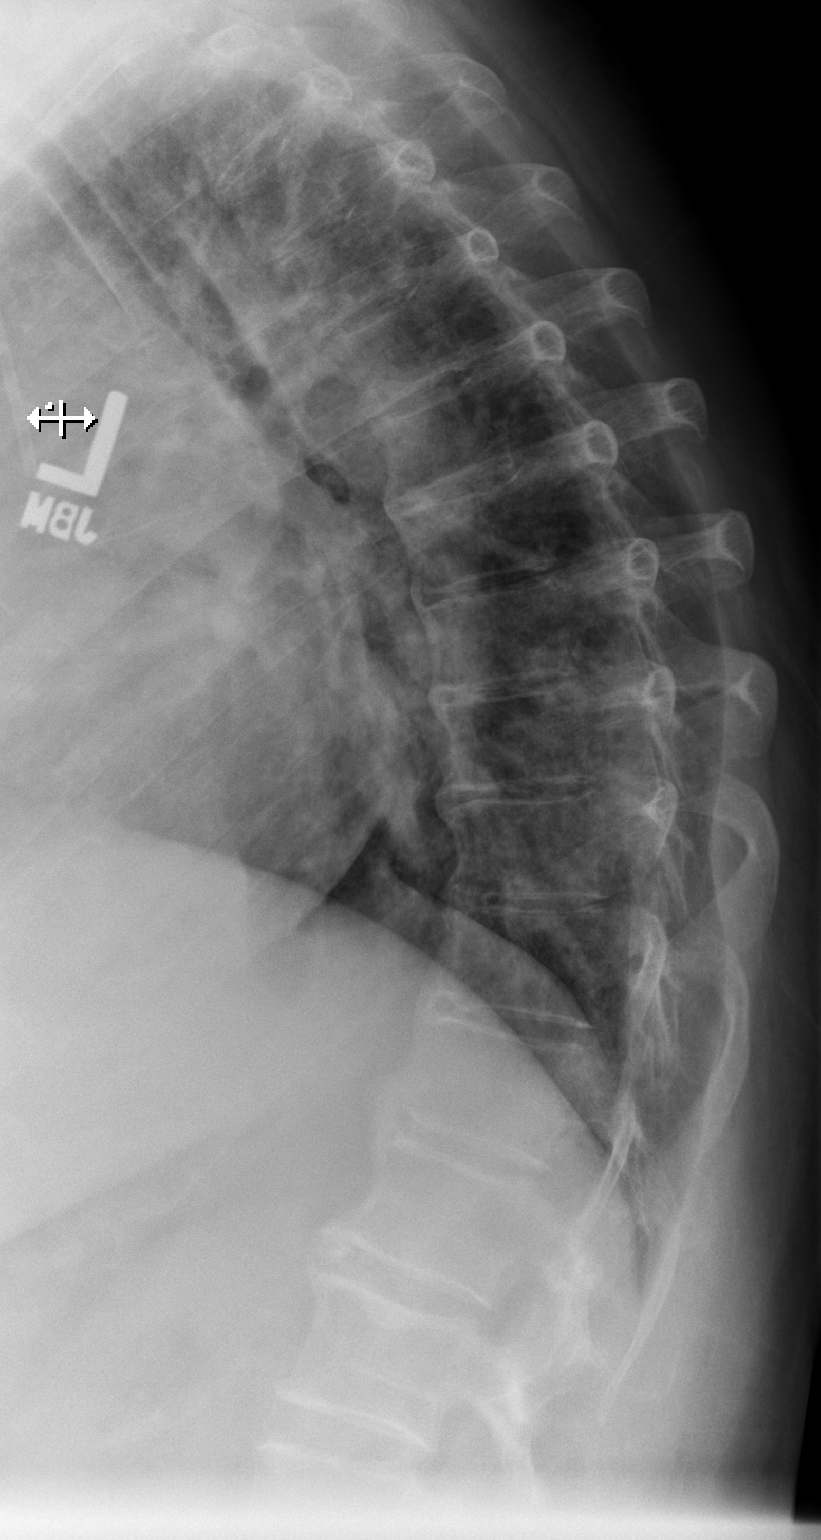

[t thoracic swimmers]
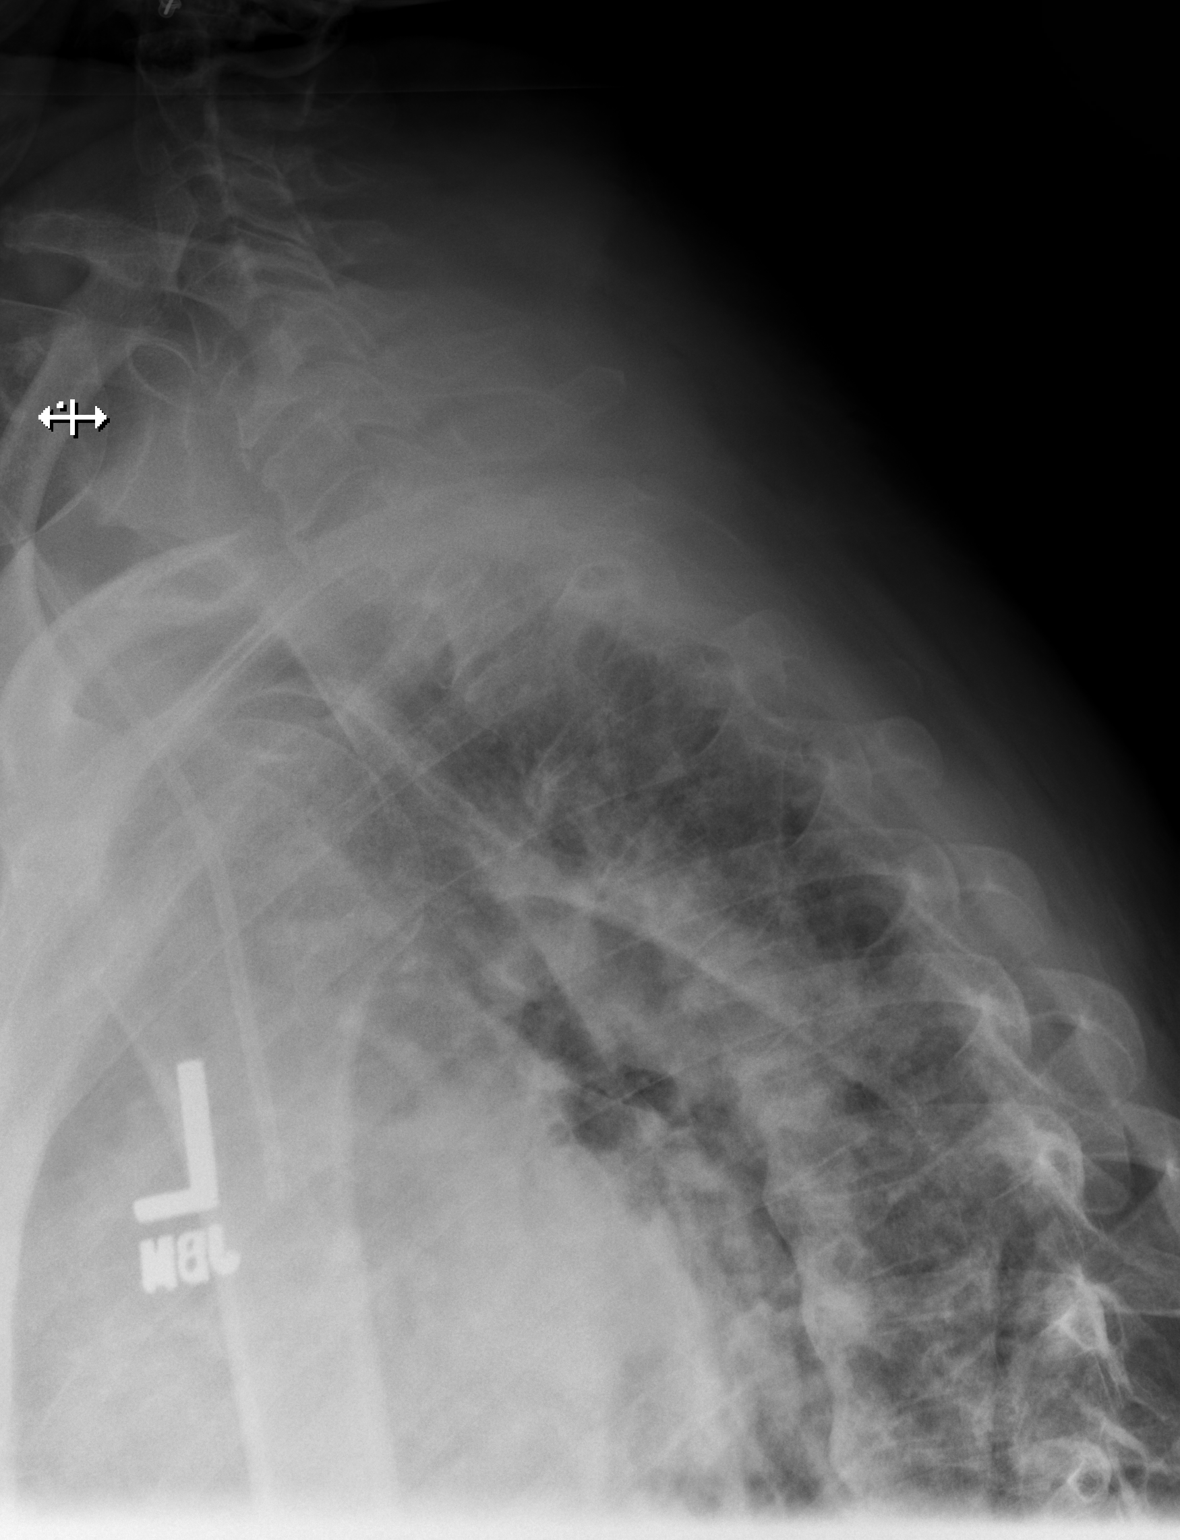

[3 of 3 positions shown; findings below may reference images not displayed]

FINDINGS: Anatomic alignment. No vertebral compression deformity. Anterior
bridging osteophytes are seen throughout the thoracic spine. No
definite acute fracture.
IMPRESSION: No acute bony pathology.

## 2018-04-06 IMAGING — DX DG LUMBAR SPINE COMPLETE 4+V
6 series · 6 of 6 positions shown · non-contrast
Comparison: Plain films lumbar spine 09/24/2014.

CLINICAL DATA: Low back pain since a slip and fall today. Initial
encounter.

EXAM:
LUMBAR SPINE - COMPLETE 4+ VIEW

[t lumbar spine ap]
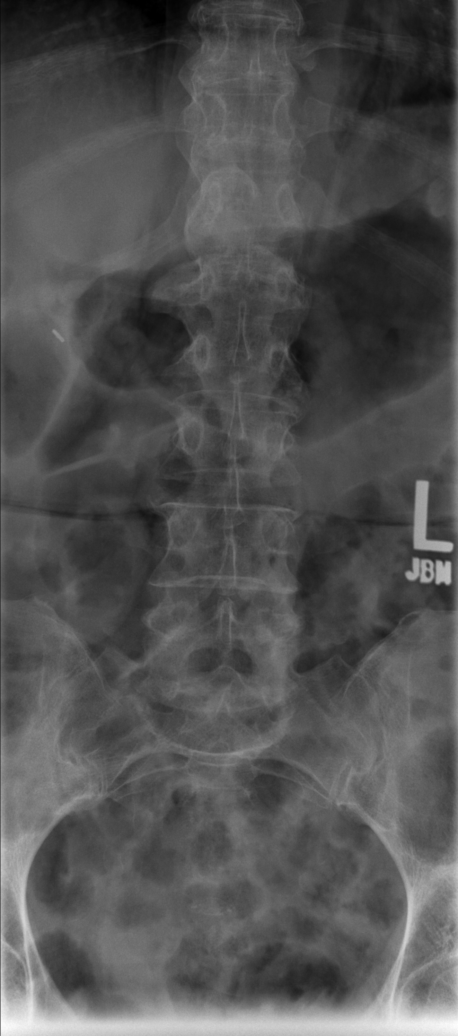

[t lumbar spine obl (1 of 2)]
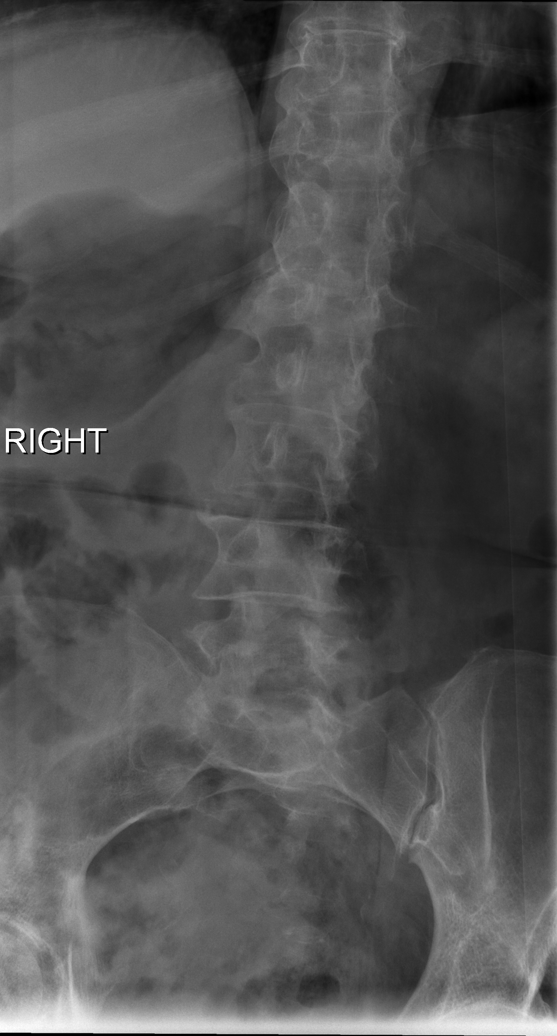

[t lumbar spine lat (1 of 2)]
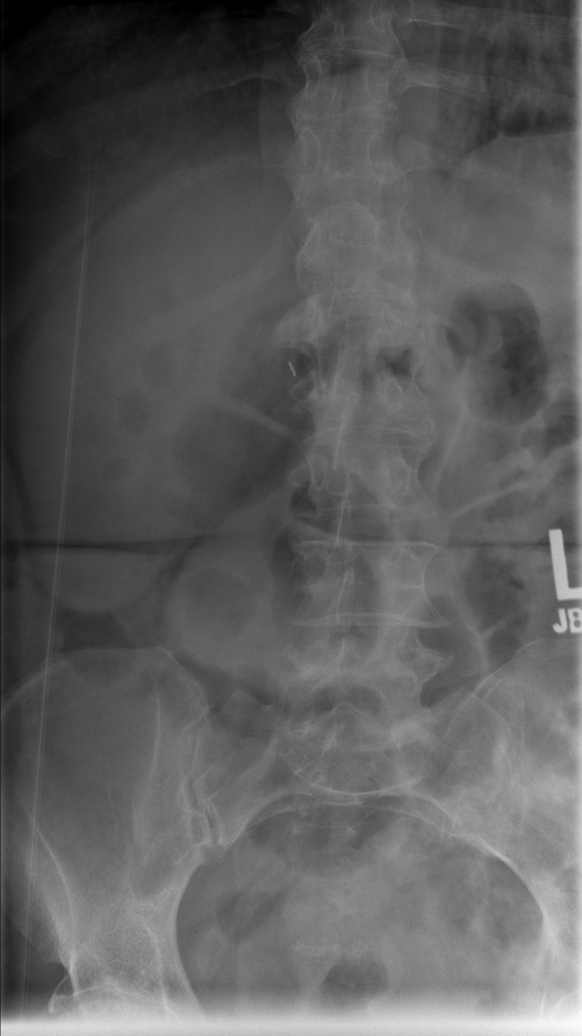

[t lumbar spine obl (2 of 2)]
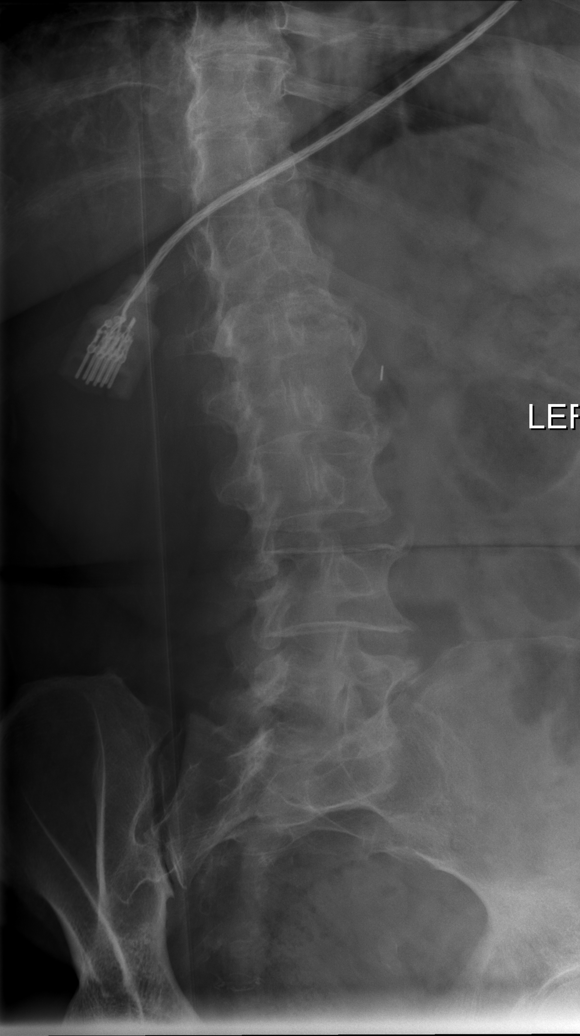

[t lumbar spine lat (2 of 2)]
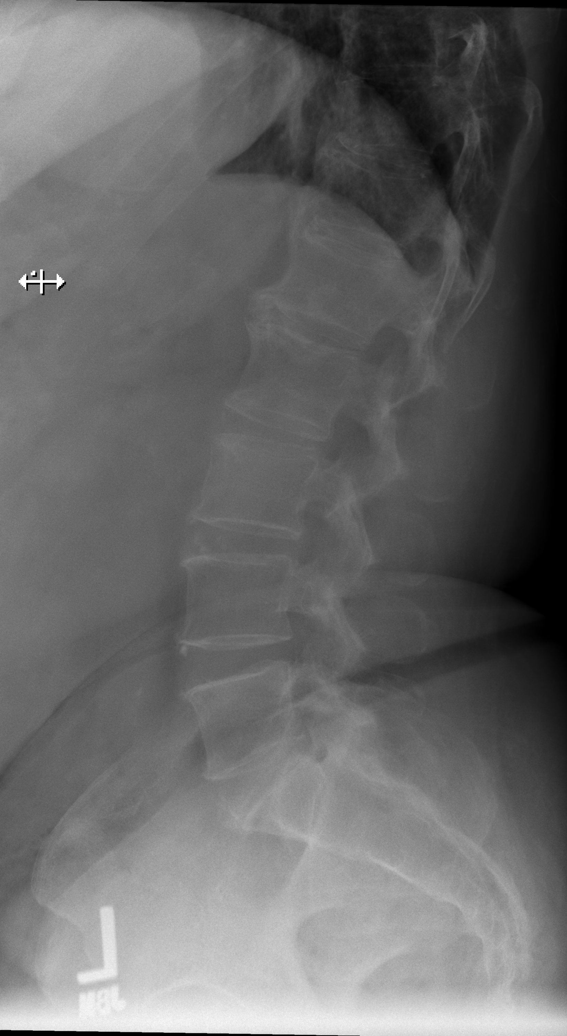

[t lumbar l-5 s-1 spot]
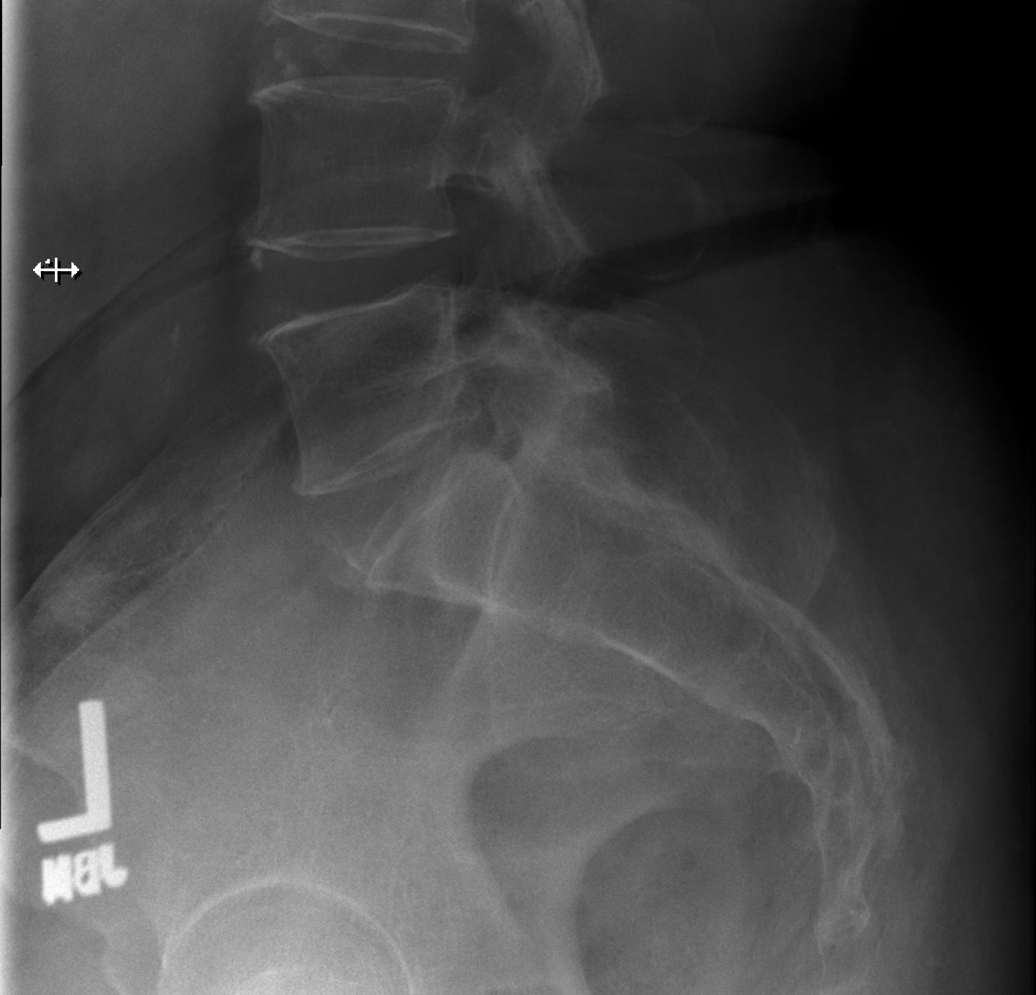

[6 of 6 positions shown; findings below may reference images not displayed]

FINDINGS: No fracture or malalignment is identified. Anterior endplate
spurring lower thoracic and upper lumbar spine is noted. Facet
degenerative disease L5-S1 is seen.
IMPRESSION: No acute abnormality.  Stable compared to prior exam.

## 2018-04-11 LAB — HM DIABETES EYE EXAM

## 2018-04-17 ENCOUNTER — Encounter: Payer: Self-pay | Admitting: *Deleted

## 2018-04-24 ENCOUNTER — Ambulatory Visit: Payer: Medicaid Other | Admitting: Dietician

## 2018-04-24 ENCOUNTER — Other Ambulatory Visit: Payer: Self-pay

## 2018-04-24 ENCOUNTER — Encounter: Payer: Self-pay | Admitting: Dietician

## 2018-04-24 NOTE — Progress Notes (Signed)
Telephone visit due to COVID-19. Outreach to [name]; She says she does think she had an infection, she called pulmonary, got a new pump thing in her chest. She is doing better.  Weight- 262.2# Meal planning-  Son grocery shopping for her. Eating sandwiches- pb&j; graham crackers. Fruit- 0-1/day;  Vegetables- in soups a few days a week. Made suggestions on easy ways to add more fruit and vegetables to her daily food intake. P: She agreed to make a list for her son to buy more fruits and vegetables; we agrees that I would mail her recipe ideas.  Debera Lat, RD 04/24/2018 9:45 AM.

## 2018-04-24 NOTE — Patient Instructions (Addendum)
DASH Eating Plan  What are tips for following this plan? _ General guidelines  Avoid adding salt to foods. Eat most foods with less than 200 mg sodium such as fruits and fresh or frozen vegetables.    Get at least 30 minutes of exercise that causes your heart to beat faster (aerobic exercise) most days of the week. Activities may include walking, swimming, or biking.   Meal planning  Eat a balanced diet that includes: ? 5 or more servings of fruits and vegetables each day.   ? At each meal, try to fill half of your plate with fruits and vegetables.   Eat at least 2 meals without meat each week.  What foods are recommended? Grains Whole-grain or whole-wheat bread. Oatmeal. Vegetables Fresh or frozen vegetables (raw, steamed, roasted, or grilled). Low-sodium or reduced-sodium tomato and vegetable juice. Low-sodium or reduced-sodium tomato sauce and tomato paste. Low-sodium or reduced-sodium canned vegetables. Fruits All fresh, dried, or frozen fruit. Canned fruit in natural juice (without added sugar).  [name] [phone]

## 2018-04-27 NOTE — Telephone Encounter (Signed)
review

## 2018-05-05 ENCOUNTER — Other Ambulatory Visit: Payer: Self-pay | Admitting: *Deleted

## 2018-05-05 DIAGNOSIS — J069 Acute upper respiratory infection, unspecified: Secondary | ICD-10-CM

## 2018-05-05 DIAGNOSIS — J984 Other disorders of lung: Secondary | ICD-10-CM | POA: Diagnosis not present

## 2018-05-05 DIAGNOSIS — G4730 Sleep apnea, unspecified: Secondary | ICD-10-CM | POA: Diagnosis not present

## 2018-05-05 DIAGNOSIS — I270 Primary pulmonary hypertension: Secondary | ICD-10-CM | POA: Diagnosis not present

## 2018-05-05 DIAGNOSIS — I5030 Unspecified diastolic (congestive) heart failure: Secondary | ICD-10-CM | POA: Diagnosis not present

## 2018-05-05 MED ORDER — CETIRIZINE HCL 10 MG PO TABS: 10.0000 mg | ORAL_TABLET | Freq: Every day | ORAL | 1 refills | Status: DC

## 2018-05-15 ENCOUNTER — Ambulatory Visit (HOSPITAL_COMMUNITY): Admission: RE | Admit: 2018-05-15 | Payer: Medicaid Other | Source: Ambulatory Visit | Admitting: Internal Medicine

## 2018-05-15 ENCOUNTER — Other Ambulatory Visit: Payer: Self-pay

## 2018-05-16 ENCOUNTER — Other Ambulatory Visit: Payer: Self-pay

## 2018-05-16 ENCOUNTER — Ambulatory Visit (HOSPITAL_COMMUNITY)
Admission: RE | Admit: 2018-05-16 | Discharge: 2018-05-16 | Disposition: A | Discharge status: Home / Self Care | Payer: Medicaid Other | Source: Ambulatory Visit | Attending: Internal Medicine | Admitting: Internal Medicine

## 2018-05-16 DIAGNOSIS — I4892 Unspecified atrial flutter: Secondary | ICD-10-CM

## 2018-05-16 DIAGNOSIS — I5032 Chronic diastolic (congestive) heart failure: Secondary | ICD-10-CM | POA: Diagnosis not present

## 2018-05-16 DIAGNOSIS — I272 Pulmonary hypertension, unspecified: Secondary | ICD-10-CM

## 2018-05-16 NOTE — Progress Notes (Signed)
Heart Failure TeleHealth Note  Due to national recommendations of social distancing due to [place] 19, Audio/video telehealth visit is felt to be most appropriate for this patient at this time.  See MyChart message from today for patient consent regarding telehealth for [name].  Date:  05/16/2018   ID:  [name], DOB May 25, 1960, MRN [id number]  Location: Home  Provider location: [facility] Type of Visit: Established patient  PCP:  [name], MD  Cardiologist:  No primary care provider on file. Primary HF: Norrine Ballester  Chief Complaint: Heart Failure follow-up   History of Present Illness:  [name] is a 58 y.o. 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   She was admitted to [facility] in July 2014 with respiratory failure and RHC showed severe PAH with pulmonary pressures in the 90s. She was transferred to [facility] where she was started on IV Remodulin with excellent results.  She was admitted 08/16/12 for CP and tachycardia, was found to be in atypical flutter vs atrial tach. Amiodarone was considered but she converted spontaneously. She was started on Xarelto. Her discharge weight was 215 lbs.    Cath 2/16: Minimal CAD LAD 30% otherwise normal RHC on Remodulin RA = 8 RV = 45/5/8 PA = 52/24 (34) PCW = 11 Fick cardiac output/index = 7.6/3.6 PVR = 3.0 WU FA sat = 99% PA sat = 70%, 71%  She presents via audio/video conferencing for a telehealth visit today.   . Last seen by Dr. Gilles Chiquito 01/25/2018. Says she is doing ok. Remains on Remodulin. Breathing at baseline. Can do all ADLs without problem. No edema, orthopnea, or PND. No problems with Remodulin. Sees Dr. Gilles Chiquito again in 2 weeks.  Wearing CPAP  qhs and 3L of O2 with activity. Occasional dizziness when she is SOB. Trying to watch her salt and fluid intake closer. She is not weighting at home. Managing her Remodulin cartridges at home by herself. Her son occasionally helps. Weight stable at 260.   Studies: RHC 1/18 RA = 7 RV = 72/9 PA = 70/31 (47) PCW = 11 Fick cardiac output/index = 7.4/3.3 PVR = 4.9 WU FA sat = 90% PA sat = 69%, 68%   ECHO 08/11/12 EF 60-65% RV moderately dilated Peak PA pressure 48 mmhg  ECHO 02/28/13 EF 55-60% Grade I DD RV severely dilated and HK. Trivial TR. Septum flat Peak PA pressure 53 mmhg ECHO 11/15 EF 55-60% RV normal Trivial TR ECHO 1/16 at [facility] EF >55% RV mildly dilated (improved function) Trivial TR normal IVC ECHO 11/2014: EF 55-60%.  RV modrately dilated. Peak PA pressure 78 mm hg ECHO 03/2015: At [facility] RV severely dilated. EF >55% RVSP 69mHG [facility]  [facility] Room air sat was 96% sats fell briefly to 89 at the very end of the walk she went 341 m this is 82% predicted 6MWD 9/17 was 362 m  This is 88% predicted on 3 L oxygen saturations did not fall below 93%. 6MWD 3/18 3479m  Mindel A MoKumagaienies symptoms worrisome for COVID 19.   Past Medical History:  Diagnosis Date  . Anemia, iron deficiency    secondary to menhorrhagia, on oral iron, also  b12 def, getting monthly b12 shots  . CHF (congestive heart failure) ([place])   . Chronic cough    secondary to alleriges and post nasal drip  . Cognitive impairment   . COPD (chronic obstructive pulmonary disease) ([place])   . Cor pulmonale (HCC)    PA Peak pressure 22mHg  . Depression   . Diabetes mellitus    well controlled on metformin  . Diastolic heart failure ([place]   . GERD (gastroesophageal reflux disease)   . H/O mental retardation   . Herpes   . Hyperlipidemia   . Hypertension   . OSA (obstructive sleep apnea)    CPAP  . Pulmonary hypertension ([place]   . Renal disorder   .  Restrictive lung disease    PFTs 06/2012 (FVC 54% predicted and FEV1 68% predicted w minimal bronchodilator response).  . Shortness of breath   . Venous stasis ulcer ([place]    chornic, ?followed up at wound care center, multiple courses of antibiotics in past for cellulitis, on lasix   Past Surgical History:  Procedure Laterality Date  . CARDIAC CATHETERIZATION N/A 01/13/2016   Procedure: Right Heart Cath;  Surgeon: DJolaine Artist MD;  [facility];  Service: Cardiovascular;  Laterality: N/A;  . CHOLECYSTECTOMY    . LEFT AND RIGHT HEART CATHETERIZATION WITH CORONARY ANGIOGRAM N/A 02/28/2014   Procedure: LEFT AND RIGHT HEART CATHETERIZATION WITH CORONARY ANGIOGRAM;  Surgeon: DJolaine Artist MD;  [facility];  Service: Cardiovascular;  Laterality: N/A;     Current Outpatient Medications  Medication Sig Dispense Refill  . acetaminophen (TYLENOL) 500 MG tablet Take 1 tablet (500 mg total) by mouth every 6 (six) hours as needed. 30 tablet 0  . cetirizine (ZYRTEC) 10 MG tablet Take 1 tablet (10 mg total) by mouth daily. 30 tablet 1  . cyanocobalamin 500 MCG tablet Take 500 mcg by mouth daily.    . diphenhydrAMINE (BENADRYL) 25 mg capsule Take 25-75 mg by mouth 2 (two) times daily as needed for itching.     .Marland Kitchendoxycycline (VIBRA-TABS) 100 MG tablet Take 1 tablet (100 mg total) by mouth 2 (two) times daily. For 10 days. 20 tablet 0  . ferrous sulfate 325 (65 FE) MG tablet Take 1 tablet (325 mg total) by mouth daily with breakfast. 90 tablet 3  . fluticasone (FLONASE) 50 MCG/ACT nasal spray Place 1 spray into both nostrils daily. 16 g 2  . gabapentin (NEURONTIN) 300 MG capsule TAKE 2 CAPSULES BY MOUTH THREE TIMES DAILY 540 capsule 4  . glucose blood (ACCU-CHEK AVIVA PLUS) test strip Use one strip to check glucose once daily. Diagnosis code E11.42 100 each 3  . metFORMIN (GLUCOPHAGE) 500 MG tablet TAKE ONE-HALF TABLET BY MOUTH WITH BREAKFAST 90 tablet 2  . mupirocin  ointment (BACTROBAN) 2 % Place 1 application into the nose 2 (two) times daily. 22 g 0  . nystatin cream (MYCOSTATIN) Apply 1 application topically 2 (two) times daily. 30 g 1  . pantoprazole (PROTONIX) 20 MG tablet Take 1 tablet (20 mg total) by mouth 2 (two) times daily. 180 tablet 11  . potassium chloride SA (K-DUR,KLOR-CON) 20 MEQ tablet TAKE 2 TABLETS BY MOUTH THREE TIMES DAILY TAKE  AN  EXTRA  TABLET  WHEN  TAKING  AN  EXTRA  TORSEMIDE 180 tablet 3  . pravastatin (PRAVACHOL) 20 MG tablet TAKE 1 TABLET BY MOUTH ONCE DAILY 90 tablet 1  . sildenafil (REVATIO) 20 MG tablet Take 1 tablet (20  mg total) by mouth 3 (three) times daily. 270 tablet 3  . sodium chloride (OCEAN) 0.65 % SOLN nasal spray Place 2 sprays into both nostrils as needed for congestion. 30 mL 0  . spironolactone (ALDACTONE) 25 MG tablet Take 0.5 tablets (12.5 mg total) by mouth daily. 90 tablet 1  . torsemide (DEMADEX) 20 MG tablet Take 2 tablets (40 mg total) by mouth 2 (two) times daily. 360 tablet 2  . treprostinil (REMODULIN) 5 MG/ML SOLN injection Inject 6 ng/mL into the skin continuous.     [name] 20 MG TABS tablet TAKE 1 TABLET BY MOUTH ONCE DAILY WITH SUPPER 90 tablet 3   No current facility-administered medications for this encounter.     Allergies:   Aspirin; Codeine; Lisinopril; Sulfonamide derivatives; and Latex   Social History:  The patient  reports that she has never smoked. She has never used smokeless tobacco. She reports that she does not drink alcohol or use drugs.   Family History:  The patient's family history includes Breast cancer in her maternal aunt; Mental illness in her sister; Mental retardation in her brother.   ROS:  Please see the history of present illness.   All other systems are personally reviewed and negative.   Exam:  (Video/Tele Health Call; Exam is subjective and or/visual.) General:  Speaks in full sentences. No resp difficulty. Lungs: Normal respiratory effort with conversation.   Abdomen: Non-distended per patient report Extremities: Pt denies edema. Neuro: Alert & oriented x 3.   Recent Labs: 12/22/2017: B Natriuretic Peptide 47.4 02/16/2018: BUN <5; Creatinine, Ser 0.81; Hemoglobin 15.0; Platelets 369; Potassium 4.8; Sodium 140  Personally reviewed   Wt Readings from Last 3 Encounters:  04/24/18 118.9 kg (262 lb 3.2 oz)  02/23/18 129.5 kg (285 lb 9.6 oz)  01/05/18 127.5 kg (281 lb 1.6 oz)      ASSESSMENT AND PLAN:  1) Chronic diastolic HF/cor pulmonale:  - NYHA III symptoms chronically. Volume status stable by her report and her weight   - Continue torsemide 80 mg daily. - Continue spiro 12.5 mg daily - No changes in setting of illness.  - Reinforced fluid restriction to < 2 L daily, sodium restriction to less than 2000 mg daily, and the importance of daily weights.     2) PAH, severe with cor pulmonale.  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 However PAH far out of proportion to L-sided pressures or restrictive lung disease. - Follows with [facility]. Last seen 10/2017 - Continue plan.  - Remains on Remodulin + sildenafil.    3) Paroxysmal atrial flutter.  - HR controlled. Appears to be in NSR. Continue Xarelto. Denies bleeding.   4) Obesity - Encouraged weight loss   COVID screen The patient does not have any symptoms that suggest any further testing/ screening at this time.  Social distancing reinforced today.  Recommended follow-up:  6 months  Relevant cardiac medications were reviewed at length with the patient today.   The patient does not have concerns regarding their medications at this time.   The following changes were made today:  As above  Today, I have spent 17 minutes with the patient with telehealth technology discussing the above issues .    Signed, Glori Bickers, MD  05/16/2018 2:49 PM  [facility] [place] [address] [facility] [place] [address]  [phone] (office) [phone] (fax)

## 2018-05-16 NOTE — Patient Instructions (Signed)
No medication changes!!  Your physician recommends that you schedule a follow-up appointment in: 6 months. You will be called in the fall to scheduled this appointment.

## 2018-05-16 NOTE — Addendum Note (Signed)
Encounter addended by: Valeda Malm, RN on: 05/16/2018 3:18 PM  Actions taken: Clinical Note Signed

## 2018-05-17 ENCOUNTER — Encounter (HOSPITAL_COMMUNITY): Payer: Self-pay | Admitting: Internal Medicine

## 2018-05-18 ENCOUNTER — Other Ambulatory Visit: Payer: Self-pay | Admitting: Internal Medicine

## 2018-05-18 DIAGNOSIS — I50810 Right heart failure, unspecified: Secondary | ICD-10-CM

## 2018-05-19 NOTE — Telephone Encounter (Signed)
refilled 

## 2018-05-24 ENCOUNTER — Other Ambulatory Visit: Payer: Self-pay | Admitting: Student in an Organized Health Care Education/Training Program

## 2018-05-24 DIAGNOSIS — E1142 Type 2 diabetes mellitus with diabetic polyneuropathy: Secondary | ICD-10-CM

## 2018-05-25 NOTE — Telephone Encounter (Signed)
refilled 

## 2018-05-30 DIAGNOSIS — R0600 Dyspnea, unspecified: Secondary | ICD-10-CM | POA: Diagnosis not present

## 2018-05-30 DIAGNOSIS — I50812 Chronic right heart failure: Secondary | ICD-10-CM | POA: Diagnosis not present

## 2018-05-30 DIAGNOSIS — I5032 Chronic diastolic (congestive) heart failure: Secondary | ICD-10-CM | POA: Diagnosis not present

## 2018-05-30 DIAGNOSIS — I272 Pulmonary hypertension, unspecified: Secondary | ICD-10-CM | POA: Diagnosis not present

## 2018-06-05 DIAGNOSIS — I5030 Unspecified diastolic (congestive) heart failure: Secondary | ICD-10-CM | POA: Diagnosis not present

## 2018-06-05 DIAGNOSIS — J984 Other disorders of lung: Secondary | ICD-10-CM | POA: Diagnosis not present

## 2018-06-05 DIAGNOSIS — G4730 Sleep apnea, unspecified: Secondary | ICD-10-CM | POA: Diagnosis not present

## 2018-06-05 DIAGNOSIS — I270 Primary pulmonary hypertension: Secondary | ICD-10-CM | POA: Diagnosis not present

## 2018-06-07 ENCOUNTER — Ambulatory Visit: Payer: Medicaid Other | Admitting: Internal Medicine

## 2018-06-07 ENCOUNTER — Other Ambulatory Visit: Payer: Self-pay

## 2018-06-07 VITALS — BP 114/66 | HR 95 | Temp 98.4°F | Ht 63.0 in | Wt 276.1 lb

## 2018-06-07 DIAGNOSIS — Z9981 Dependence on supplemental oxygen: Secondary | ICD-10-CM | POA: Diagnosis not present

## 2018-06-07 DIAGNOSIS — R1032 Left lower quadrant pain: Secondary | ICD-10-CM

## 2018-06-07 DIAGNOSIS — S39011A Strain of muscle, fascia and tendon of abdomen, initial encounter: Secondary | ICD-10-CM | POA: Insufficient documentation

## 2018-06-07 DIAGNOSIS — R062 Wheezing: Secondary | ICD-10-CM | POA: Diagnosis not present

## 2018-06-07 MED ORDER — CYCLOBENZAPRINE HCL 5 MG PO TABS: 5.0000 mg | ORAL_TABLET | Freq: Every day | ORAL | 0 refills | Status: DC | PRN

## 2018-06-07 MED ORDER — DICLOFENAC SODIUM 1 % TD GEL: 2.0000 g | Freq: Four times a day (QID) | TRANSDERMAL | 1 refills | Status: DC

## 2018-06-07 NOTE — Progress Notes (Signed)
CC: Abdominal Wall Pain  HPI:  [name] is a 58 y.o. F with PMHx listed below presenting for Abdominal Wall Pain. Please see the A&P for the status of the patient's chronic medical problems.  Past Medical History:  Diagnosis Date  . Anemia, iron deficiency    secondary to menhorrhagia, on oral iron, also b12 def, getting monthly b12 shots  . CHF (congestive heart failure) ([place])   . Chronic cough    secondary to alleriges and post nasal drip  . Cognitive impairment   . COPD (chronic obstructive pulmonary disease) ([place])   . Cor pulmonale (HCC)    PA Peak pressure 74mHg  . Depression   . Diabetes mellitus    well controlled on metformin  . Diastolic heart failure ([place]   . GERD (gastroesophageal reflux disease)   . H/O mental retardation   . Herpes   . Hyperlipidemia   . Hypertension   . OSA (obstructive sleep apnea)    CPAP  . Pulmonary hypertension ([place]   . Renal disorder   . Restrictive lung disease    PFTs 06/2012 (FVC 54% predicted and FEV1 68% predicted w minimal bronchodilator response).  . Shortness of breath   . Venous stasis ulcer ([place]    chornic, ?followed up at wound care center, multiple courses of antibiotics in past for cellulitis, on lasix   Review of Systems:  Performed and all others negative.  Physical Exam:  Vitals:   06/07/18 1457  BP: 114/66  Pulse: 95  Temp: 98.4 F (36.9 C)  TempSrc: Oral  SpO2: 97%  Weight: 276 lb 1.6 oz (125.2 kg)  Height: 5' 3" (1.6 m)   Physical Exam Constitutional:      General: She is not in acute distress.    Appearance: Normal appearance.  Cardiovascular:     Rate and Rhythm: Normal rate and regular rhythm.     Pulses: Normal pulses.     Heart sounds: Normal heart sounds.  Pulmonary:     Effort: Pulmonary effort is normal. No respiratory distress.     Breath sounds: Wheezing present.     Comments: Trace wheezing bilat On chronic oxygen Abdominal:     General: Bowel sounds are normal. There  is no distension.     Palpations: Abdomen is soft.     Tenderness: There is abdominal tenderness.       Comments: Pain along abdominal muscle of LLQ in skin fold.  Musculoskeletal:        General: No swelling or deformity.     Right lower leg: Edema present.     Left lower leg: Edema present.     Comments: Mild bilateral LE edema  Skin:    General: Skin is warm and dry.  Neurological:     General: No focal deficit present.     Mental Status: Mental status is at baseline.    Assessment & Plan:   See Encounters Tab for problem based charting.  Patient discussed with Dr. BLynnae January

## 2018-06-07 NOTE — Assessment & Plan Note (Signed)
Patient reports 4 days of left lower abdominal muscle pain. The pain initially began when she stood up full from a seated position and noticed her muscle tighten. It initially lasted for about 5-10 minutes, but recurred when she was twisting while getting into bed. She states she has had a similar muscle pain in the past when she stood up from a chair while trying to hold in a bowl movement.   The pain comes and goes. It is worse when she stand up straight or twists at the waist. It is relieved by sitting and will go away in about 30 minutes if she is laying flat (though she laid on her right side last night, which was painful). She states that with her previous episode she had good relieve with Voltaren gel.  History and exam are consistent with an abdominal muscle strain. Will provide pain relief and supportive care. - Voltaren gel QID - Apply Heat TID - Gentle massage - Flexeril 62m PRN, muscle spasm

## 2018-06-07 NOTE — Patient Instructions (Addendum)
Thank you for allowing [place] to care for you  For your abdominal pain - This is consistent with a muscle strain - Please apply the Voltaren Gel four times a day to the area - Apply heat to the area 3 times per day - You can take tylenol as need for pain relief and Flexeril as needed for muscle spasms  Please follow up with your Primary doctor, Dr. Shan Levans, at his next available appointment

## 2018-06-09 NOTE — Progress Notes (Signed)
Internal Medicine Clinic Attending  Case discussed with Dr. Trilby Drummer at the time of the visit.  We reviewed the resident's history and exam and pertinent patient test results.  I agree with the assessment, diagnosis, and plan of care documented in the resident's note.

## 2018-06-15 ENCOUNTER — Inpatient Hospital Stay (HOSPITAL_COMMUNITY)
Admission: EM | Admit: 2018-06-15 | Discharge: 2018-07-03 | DRG: 871 | Disposition: A | Discharge status: Home Health | Payer: Medicaid Other | Attending: Student in an Organized Health Care Education/Training Program | Admitting: Student in an Organized Health Care Education/Training Program

## 2018-06-15 ENCOUNTER — Emergency Department (HOSPITAL_COMMUNITY): Payer: Medicaid Other

## 2018-06-15 ENCOUNTER — Other Ambulatory Visit: Payer: Self-pay

## 2018-06-15 ENCOUNTER — Encounter (HOSPITAL_COMMUNITY): Payer: Self-pay | Admitting: Emergency Medicine

## 2018-06-15 DIAGNOSIS — E1142 Type 2 diabetes mellitus with diabetic polyneuropathy: Secondary | ICD-10-CM | POA: Diagnosis present

## 2018-06-15 DIAGNOSIS — E1122 Type 2 diabetes mellitus with diabetic chronic kidney disease: Secondary | ICD-10-CM | POA: Diagnosis present

## 2018-06-15 DIAGNOSIS — I5082 Biventricular heart failure: Secondary | ICD-10-CM | POA: Diagnosis present

## 2018-06-15 DIAGNOSIS — G4733 Obstructive sleep apnea (adult) (pediatric): Secondary | ICD-10-CM | POA: Diagnosis present

## 2018-06-15 DIAGNOSIS — J189 Pneumonia, unspecified organism: Secondary | ICD-10-CM | POA: Diagnosis not present

## 2018-06-15 DIAGNOSIS — E119 Type 2 diabetes mellitus without complications: Secondary | ICD-10-CM | POA: Diagnosis not present

## 2018-06-15 DIAGNOSIS — E785 Hyperlipidemia, unspecified: Secondary | ICD-10-CM | POA: Diagnosis present

## 2018-06-15 DIAGNOSIS — J9811 Atelectasis: Secondary | ICD-10-CM | POA: Diagnosis present

## 2018-06-15 DIAGNOSIS — I5033 Acute on chronic diastolic (congestive) heart failure: Secondary | ICD-10-CM | POA: Diagnosis not present

## 2018-06-15 DIAGNOSIS — G3184 Mild cognitive impairment, so stated: Secondary | ICD-10-CM

## 2018-06-15 DIAGNOSIS — I517 Cardiomegaly: Secondary | ICD-10-CM | POA: Diagnosis not present

## 2018-06-15 DIAGNOSIS — L03115 Cellulitis of right lower limb: Secondary | ICD-10-CM | POA: Diagnosis not present

## 2018-06-15 DIAGNOSIS — J9620 Acute and chronic respiratory failure, unspecified whether with hypoxia or hypercapnia: Secondary | ICD-10-CM | POA: Diagnosis not present

## 2018-06-15 DIAGNOSIS — I4892 Unspecified atrial flutter: Secondary | ICD-10-CM | POA: Diagnosis not present

## 2018-06-15 DIAGNOSIS — R0989 Other specified symptoms and signs involving the circulatory and respiratory systems: Secondary | ICD-10-CM | POA: Diagnosis not present

## 2018-06-15 DIAGNOSIS — Z888 Allergy status to other drugs, medicaments and biological substances status: Secondary | ICD-10-CM | POA: Diagnosis not present

## 2018-06-15 DIAGNOSIS — Z9981 Dependence on supplemental oxygen: Secondary | ICD-10-CM

## 2018-06-15 DIAGNOSIS — J440 Chronic obstructive pulmonary disease with acute lower respiratory infection: Secondary | ICD-10-CM | POA: Diagnosis present

## 2018-06-15 DIAGNOSIS — A419 Sepsis, unspecified organism: Principal | ICD-10-CM | POA: Diagnosis present

## 2018-06-15 DIAGNOSIS — Z9989 Dependence on other enabling machines and devices: Secondary | ICD-10-CM

## 2018-06-15 DIAGNOSIS — I272 Pulmonary hypertension, unspecified: Secondary | ICD-10-CM

## 2018-06-15 DIAGNOSIS — R05 Cough: Secondary | ICD-10-CM

## 2018-06-15 DIAGNOSIS — R0602 Shortness of breath: Secondary | ICD-10-CM | POA: Diagnosis not present

## 2018-06-15 DIAGNOSIS — I480 Paroxysmal atrial fibrillation: Secondary | ICD-10-CM | POA: Diagnosis present

## 2018-06-15 DIAGNOSIS — K219 Gastro-esophageal reflux disease without esophagitis: Secondary | ICD-10-CM | POA: Diagnosis present

## 2018-06-15 DIAGNOSIS — E1151 Type 2 diabetes mellitus with diabetic peripheral angiopathy without gangrene: Secondary | ICD-10-CM | POA: Diagnosis not present

## 2018-06-15 DIAGNOSIS — I2781 Cor pulmonale (chronic): Secondary | ICD-10-CM | POA: Diagnosis not present

## 2018-06-15 DIAGNOSIS — R6883 Chills (without fever): Secondary | ICD-10-CM | POA: Diagnosis not present

## 2018-06-15 DIAGNOSIS — Z7901 Long term (current) use of anticoagulants: Secondary | ICD-10-CM | POA: Diagnosis not present

## 2018-06-15 DIAGNOSIS — Z885 Allergy status to narcotic agent status: Secondary | ICD-10-CM | POA: Diagnosis not present

## 2018-06-15 DIAGNOSIS — E875 Hyperkalemia: Secondary | ICD-10-CM | POA: Diagnosis not present

## 2018-06-15 DIAGNOSIS — Z818 Family history of other mental and behavioral disorders: Secondary | ICD-10-CM | POA: Diagnosis not present

## 2018-06-15 DIAGNOSIS — Z79899 Other long term (current) drug therapy: Secondary | ICD-10-CM

## 2018-06-15 DIAGNOSIS — E876 Hypokalemia: Secondary | ICD-10-CM | POA: Diagnosis not present

## 2018-06-15 DIAGNOSIS — Z810 Family history of intellectual disabilities: Secondary | ICD-10-CM | POA: Diagnosis not present

## 2018-06-15 DIAGNOSIS — J181 Lobar pneumonia, unspecified organism: Secondary | ICD-10-CM | POA: Diagnosis not present

## 2018-06-15 DIAGNOSIS — J9622 Acute and chronic respiratory failure with hypercapnia: Secondary | ICD-10-CM | POA: Diagnosis not present

## 2018-06-15 DIAGNOSIS — Z803 Family history of malignant neoplasm of breast: Secondary | ICD-10-CM

## 2018-06-15 DIAGNOSIS — R509 Fever, unspecified: Secondary | ICD-10-CM | POA: Diagnosis not present

## 2018-06-15 DIAGNOSIS — N181 Chronic kidney disease, stage 1: Secondary | ICD-10-CM | POA: Diagnosis present

## 2018-06-15 DIAGNOSIS — Z7984 Long term (current) use of oral hypoglycemic drugs: Secondary | ICD-10-CM | POA: Diagnosis not present

## 2018-06-15 DIAGNOSIS — Z7401 Bed confinement status: Secondary | ICD-10-CM

## 2018-06-15 DIAGNOSIS — J9621 Acute and chronic respiratory failure with hypoxia: Secondary | ICD-10-CM | POA: Diagnosis not present

## 2018-06-15 DIAGNOSIS — R000 Tachycardia, unspecified: Secondary | ICD-10-CM | POA: Diagnosis not present

## 2018-06-15 DIAGNOSIS — Z20828 Contact with and (suspected) exposure to other viral communicable diseases: Secondary | ICD-10-CM | POA: Diagnosis not present

## 2018-06-15 DIAGNOSIS — I878 Other specified disorders of veins: Secondary | ICD-10-CM

## 2018-06-15 DIAGNOSIS — Z882 Allergy status to sulfonamides status: Secondary | ICD-10-CM | POA: Diagnosis not present

## 2018-06-15 DIAGNOSIS — I2721 Secondary pulmonary arterial hypertension: Secondary | ICD-10-CM | POA: Diagnosis not present

## 2018-06-15 DIAGNOSIS — I5032 Chronic diastolic (congestive) heart failure: Secondary | ICD-10-CM | POA: Diagnosis not present

## 2018-06-15 DIAGNOSIS — Z7951 Long term (current) use of inhaled steroids: Secondary | ICD-10-CM | POA: Diagnosis not present

## 2018-06-15 DIAGNOSIS — Z91040 Latex allergy status: Secondary | ICD-10-CM

## 2018-06-15 DIAGNOSIS — Z886 Allergy status to analgesic agent status: Secondary | ICD-10-CM

## 2018-06-15 HISTORY — DX: Acute and chronic respiratory failure, unspecified whether with hypoxia or hypercapnia: J96.20

## 2018-06-15 LAB — CBC WITH DIFFERENTIAL/PLATELET
Abs Immature Granulocytes: 0.2 10*3/uL — ABNORMAL HIGH (ref 0.00–0.07)
Basophils Absolute: 0 10*3/uL (ref 0.0–0.1)
Basophils Relative: 0 %
Eosinophils Absolute: 0 10*3/uL (ref 0.0–0.5)
Eosinophils Relative: 0 %
HCT: 50.6 % — ABNORMAL HIGH (ref 36.0–46.0)
Hemoglobin: 16.3 g/dL — ABNORMAL HIGH (ref 12.0–15.0)
Immature Granulocytes: 1 %
Lymphocytes Relative: 3 %
Lymphs Abs: 0.9 10*3/uL (ref 0.7–4.0)
MCH: 29.5 pg (ref 26.0–34.0)
MCHC: 32.2 g/dL (ref 30.0–36.0)
MCV: 91.7 fL (ref 80.0–100.0)
Monocytes Absolute: 1.3 10*3/uL — ABNORMAL HIGH (ref 0.1–1.0)
Monocytes Relative: 5 %
Neutro Abs: 22.4 10*3/uL — ABNORMAL HIGH (ref 1.7–7.7)
Neutrophils Relative %: 91 %
Platelets: 388 10*3/uL (ref 150–400)
RBC: 5.52 MIL/uL — ABNORMAL HIGH (ref 3.87–5.11)
RDW: 18.7 % — ABNORMAL HIGH (ref 11.5–15.5)
WBC: 24.8 10*3/uL — ABNORMAL HIGH (ref 4.0–10.5)
nRBC: 0 % (ref 0.0–0.2)

## 2018-06-15 LAB — COMPREHENSIVE METABOLIC PANEL
ALT: 24 U/L (ref 0–44)
AST: 42 U/L — ABNORMAL HIGH (ref 15–41)
Albumin: 3.7 g/dL (ref 3.5–5.0)
Alkaline Phosphatase: 76 U/L (ref 38–126)
Anion gap: 11 (ref 5–15)
BUN: 9 mg/dL (ref 6–20)
CO2: 24 mmol/L (ref 22–32)
Calcium: 9 mg/dL (ref 8.9–10.3)
Chloride: 103 mmol/L (ref 98–111)
Creatinine, Ser: 0.8 mg/dL (ref 0.44–1.00)
GFR calc Af Amer: >60 mL/min (ref >60)
GFR calc non Af Amer: >60 mL/min (ref >60)
Glucose, Bld: 118 mg/dL — ABNORMAL HIGH (ref 70–99)
Potassium: 4.1 mmol/L (ref 3.5–5.1)
Sodium: 138 mmol/L (ref 135–145)
Total Bilirubin: 0.7 mg/dL (ref 0.3–1.2)
Total Protein: 7.1 g/dL (ref 6.5–8.1)

## 2018-06-15 LAB — SARS CORONAVIRUS 2: SARS Coronavirus 2: NOT DETECTED

## 2018-06-15 LAB — GLUCOSE, CAPILLARY
Glucose-Capillary: 101 mg/dL — ABNORMAL HIGH (ref 70–99)
Glucose-Capillary: 69 mg/dL — ABNORMAL LOW (ref 70–99)
Glucose-Capillary: 106 mg/dL — ABNORMAL HIGH (ref 70–99)

## 2018-06-15 LAB — PROCALCITONIN: Procalcitonin: 0.17 ng/mL

## 2018-06-15 LAB — BRAIN NATRIURETIC PEPTIDE: B Natriuretic Peptide: 49 pg/mL (ref 0.0–100.0)

## 2018-06-15 LAB — LACTIC ACID, PLASMA
Lactic Acid, Venous: 2.6 mmol/L (ref 0.5–1.9)
Lactic Acid, Venous: 2.1 mmol/L (ref 0.5–1.9)

## 2018-06-15 MED ORDER — FLUTICASONE PROPIONATE 50 MCG/ACT NA SUSP
1.0000 | Freq: Every day | NASAL | Status: DC | PRN
  Administered 2018-06-26: 1 via NASAL
  Filled 2018-06-15: qty 16

## 2018-06-15 MED ORDER — ACETAMINOPHEN 325 MG PO TABS
650.0000 mg | ORAL_TABLET | Freq: Once | ORAL | Status: AC
  Administered 2018-06-15: 650 mg via ORAL
  Filled 2018-06-15: qty 2

## 2018-06-15 MED ORDER — RIVAROXABAN 20 MG PO TABS
20.0000 mg | ORAL_TABLET | Freq: Every day | ORAL | Status: DC
  Administered 2018-06-15 – 2018-07-02 (×18): 20 mg via ORAL
  Filled 2018-06-15 (×18): qty 1

## 2018-06-15 MED ORDER — SODIUM CHLORIDE 0.9 % IV BOLUS
500.0000 mL | Freq: Once | INTRAVENOUS | Status: AC
  Administered 2018-06-15: 500 mL via INTRAVENOUS

## 2018-06-15 MED ORDER — TREPROSTINIL 100 MG/20ML IJ SOLN
77.0000 ng/kg/min | INTRAVENOUS | Status: DC
  Administered 2018-06-15: 77 ng/kg/min via INTRAVENOUS
  Filled 2018-06-15: qty 6

## 2018-06-15 MED ORDER — INSULIN ASPART 100 UNIT/ML ~~LOC~~ SOLN
0.0000 [IU] | Freq: Three times a day (TID) | SUBCUTANEOUS | Status: DC
  Administered 2018-06-16: 2 [IU] via SUBCUTANEOUS
  Administered 2018-06-17: 3 [IU] via SUBCUTANEOUS
  Administered 2018-06-18 – 2018-06-21 (×5): 1 [IU] via SUBCUTANEOUS
  Administered 2018-06-23 – 2018-06-24 (×2): 2 [IU] via SUBCUTANEOUS
  Administered 2018-06-25 – 2018-06-27 (×3): 1 [IU] via SUBCUTANEOUS
  Administered 2018-06-28: 2 [IU] via SUBCUTANEOUS
  Administered 2018-06-29: 1 [IU] via SUBCUTANEOUS
  Administered 2018-06-29 – 2018-06-30 (×3): 2 [IU] via SUBCUTANEOUS
  Administered 2018-07-01 (×2): 1 [IU] via SUBCUTANEOUS
  Administered 2018-07-02 – 2018-07-03 (×3): 2 [IU] via SUBCUTANEOUS

## 2018-06-15 MED ORDER — ACETAMINOPHEN 325 MG PO TABS
650.0000 mg | ORAL_TABLET | Freq: Four times a day (QID) | ORAL | Status: DC | PRN
  Administered 2018-06-15 – 2018-06-24 (×12): 650 mg via ORAL
  Filled 2018-06-15 (×12): qty 2

## 2018-06-15 MED ORDER — ONDANSETRON HCL 4 MG PO TABS: 4.0000 mg | ORAL_TABLET | Freq: Four times a day (QID) | ORAL | Status: DC | PRN

## 2018-06-15 MED ORDER — SODIUM CHLORIDE 0.9 % IV SOLN
1.0000 g | INTRAVENOUS | Status: DC
  Administered 2018-06-16 – 2018-06-20 (×5): 1 g via INTRAVENOUS
  Filled 2018-06-15 (×6): qty 10

## 2018-06-15 MED ORDER — GABAPENTIN 300 MG PO CAPS
600.0000 mg | ORAL_CAPSULE | Freq: Three times a day (TID) | ORAL | Status: DC
  Administered 2018-06-15 – 2018-07-03 (×55): 600 mg via ORAL
  Filled 2018-06-15 (×55): qty 2

## 2018-06-15 MED ORDER — DEXTROSE 50 % IV SOLN
12.5000 g | INTRAVENOUS | Status: AC
  Administered 2018-06-15: 12.5 g via INTRAVENOUS
  Filled 2018-06-15: qty 50

## 2018-06-15 MED ORDER — DEXTROSE 5 % IV SOLN
250.0000 mg | INTRAVENOUS | Status: DC
  Administered 2018-06-16 – 2018-06-21 (×6): 250 mg via INTRAVENOUS
  Filled 2018-06-15 (×6): qty 250

## 2018-06-15 MED ORDER — ACETAMINOPHEN 650 MG RE SUPP: 650.0000 mg | Freq: Four times a day (QID) | RECTAL | Status: DC | PRN

## 2018-06-15 MED ORDER — SODIUM CHLORIDE 0.9 % IV SOLN
1.0000 g | Freq: Once | INTRAVENOUS | Status: AC
  Administered 2018-06-15: 1 g via INTRAVENOUS
  Filled 2018-06-15: qty 10

## 2018-06-15 MED ORDER — ONDANSETRON HCL 4 MG/2ML IJ SOLN: 4.0000 mg | Freq: Four times a day (QID) | INTRAMUSCULAR | Status: DC | PRN

## 2018-06-15 MED ORDER — PANTOPRAZOLE SODIUM 20 MG PO TBEC
20.0000 mg | DELAYED_RELEASE_TABLET | Freq: Two times a day (BID) | ORAL | Status: DC
  Administered 2018-06-15 – 2018-07-03 (×37): 20 mg via ORAL
  Filled 2018-06-15 (×37): qty 1

## 2018-06-15 MED ORDER — SODIUM CHLORIDE 0.9 % IV SOLN
500.0000 mg | Freq: Once | INTRAVENOUS | Status: AC
  Administered 2018-06-15: 500 mg via INTRAVENOUS
  Filled 2018-06-15: qty 500

## 2018-06-15 MED ORDER — PRAVASTATIN SODIUM 10 MG PO TABS
20.0000 mg | ORAL_TABLET | Freq: Every day | ORAL | Status: DC
  Administered 2018-06-15 – 2018-07-03 (×19): 20 mg via ORAL
  Filled 2018-06-15 (×19): qty 2

## 2018-06-15 MED ORDER — IPRATROPIUM-ALBUTEROL 0.5-2.5 (3) MG/3ML IN SOLN: 3.0000 mL | Freq: Three times a day (TID) | RESPIRATORY_TRACT | Status: DC | PRN

## 2018-06-15 NOTE — ED Notes (Signed)
ED TO INPATIENT HANDOFF REPORT  ED Nurse Name and Phone #: [place] [phone]  S Name/Age/Gender [name] 58 y.o. female Room/Bed: 015C/015C  Code Status   Code Status: Full Code  Home/SNF/Other Home {Patient oriented to Is this baseline? Yes   Triage Complete: Triage complete  Chief Complaint sick  Triage Note Patient arrived with EMS from home reports sudden onset SOB with productive cough/chest congestion this morning ( 3 am) , history of CHF/COPD/Pulmonary Hypertension. Denies fever or chills , denies sick contact with family members , her cardiologist is Dr. Missy Sabins.    Allergies Allergies  Allergen Reactions  . Aspirin     REACTION: airway swelling  . Codeine     REACTION: tingling in lips and hard breathing - had reaction at dentist - states "I can't take certain kinds of codeine" - happened maybe 10 yr ago  . Lisinopril     Cough  . Sulfonamide Derivatives     REACTION: airway swelling  . Latex Rash    Level of Care/Admitting Diagnosis ED Disposition    ED Disposition Condition [name]: [place] [[id number]]  Level of Care: Progressive [102]  Covid Evaluation: Confirmed COVID Negative  Diagnosis: Acute on chronic respiratory failure [facility]) [[id number]]  Admitting Physician: Oda Kilts [[id number]]  Attending Physician: Oda Kilts [[id number]]  Estimated length of stay: past midnight tomorrow  Certification:: I certify this patient will need inpatient services for at least 2 midnights  PT Class (Do Not Modify): Inpatient [101]  PT Acc Code (Do Not Modify): Private [1]       B Medical/Surgery History Past Medical History:  Diagnosis Date  . Anemia, iron deficiency    secondary to menhorrhagia, on oral iron, also b12 def, getting monthly b12 shots  . CHF (congestive heart failure) ([place])   . Chronic cough    secondary to alleriges and post nasal drip  . Cognitive impairment   . COPD (chronic  obstructive pulmonary disease) ([place])   . Cor pulmonale ([facility])    PA Peak pressure 56mHg  . Depression   . Diabetes mellitus    well controlled on metformin  . Diastolic heart failure ([facility]   . GERD (gastroesophageal reflux disease)   . H/O mental retardation   . Herpes   . Hyperlipidemia   . Hypertension   . OSA (obstructive sleep apnea)    CPAP  . Pulmonary hypertension ([facility]   . Renal disorder   . Restrictive lung disease    PFTs 06/2012 (FVC 54% predicted and FEV1 68% predicted w minimal bronchodilator response).  . Shortness of breath   . Venous stasis ulcer ([facility] [place]    chornic, ?followed up at wound care center, multiple courses of antibiotics in past for cellulitis, on lasix   Past Surgical History:  Procedure Laterality Date  . CARDIAC CATHETERIZATION N/A 01/13/2016   Procedure: Right Heart Cath;  Surgeon: DJolaine Artist MD;  [facility];  Service: Cardiovascular;  Laterality: N/A;  . CHOLECYSTECTOMY    . LEFT AND RIGHT HEART CATHETERIZATION WITH CORONARY ANGIOGRAM N/A 02/28/2014   Procedure: LEFT AND RIGHT HEART CATHETERIZATION WITH CORONARY ANGIOGRAM;  Surgeon: DJolaine Artist MD;  [facility];  Service: Cardiovascular;  Laterality: N/A;     A IV Location/Drains/Wounds Patient Lines/Drains/Airways Status   Active Line/Drains/Airways    Name:   Placement date:   Placement time:   Site:   Days:   Peripheral  IV 02/10/16 Right Hand   02/10/16    0159    Hand   856   Peripheral IV 06/15/18 Left Antecubital   06/15/18    0725    Antecubital   less than 1   Peripheral IV 06/15/18 Left Hand   06/15/18    0933    Hand   less than 1   CVC Single Lumen 07/06/13 Right Internal jugular   07/06/13    1725    Internal jugular   1805   Pressure Ulcer 08/16/12 Unstageable - Full thickness tissue loss in which the base of the ulcer is covered by slough (yellow, tan, gray, green or brown) and/or eschar (tan, brown or black) in the wound bed. small area  covered in black to right buttox   08/16/12    2237     2129   Pressure Ulcer 08/17/12 Stage I -  Intact skin with non-blanchable redness of a localized area usually over a bony prominence. nonblanchable redness around open area- WOC assessment 8/14, sacral area appears to blanch and has no open areas.   08/17/12    0000     2128   Wound 08/16/12 Diabetic ulcer Toe (Comment  which one) Right;Left dry diabetic ulcers   08/16/12    2236    Toe (Comment  which one)   2129          Intake/Output Last 24 hours No intake or output data in the 24 hours ending 06/15/18 1104  Labs/Imaging Results for orders placed or performed during the hospital encounter of 06/15/18 (from the past 48 hour(s))  SARS Coronavirus 2     Status: None   Collection Time: 06/15/18  7:25 AM  Result Value Ref Range   SARS Coronavirus 2 NOT DETECTED NOT DETECTED    Comment: (NOTE) SARS-CoV-2 target nucleic acids are NOT DETECTED. The SARS-CoV-2 RNA is generally detectable in upper and lower respiratory specimens during the acute phase of infection.  Negative  results do not preclude SARS-CoV-2 infection, do not rule out co-infections with other pathogens, and should not be used as the sole basis for treatment or other patient management decisions.  Negative results must be combined with clinical observations, patient history, and epidemiological information. The expected result is Not Detected. Fact Sheet for Patients: http://www.biofiredefense.com/wp-content/uploads/2020/03/BIOFIRE-COVID -19-patients.pdf Fact Sheet for Healthcare Providers: http://www.biofiredefense.com/wp-content/uploads/2020/03/BIOFIRE-COVID -19-hcp.pdf This test is not yet approved or cleared by the [place] and  has been authorized for detection and/or diagnosis of SARS-CoV-2 by FDA under an Emergency Use Authorization (EUA).  This EUA will remain in effec t (meaning this test can be used) for the duration of  the COVID-19  declaration under Section 564(b)(1) of the Act, 21 U.S.C. section 360bbb-3(b)(1), unless the authorization is terminated or revoked sooner. Performed at [facility], [place] [address] [place], [place] [address]   Comprehensive metabolic panel     Status: Abnormal   Collection Time: 06/15/18  7:44 AM  Result Value Ref Range   Sodium 138 135 - 145 mmol/L   Potassium 4.1 3.5 - 5.1 mmol/L   Chloride 103 98 - 111 mmol/L   CO2 24 22 - 32 mmol/L   Glucose, Bld 118 (H) 70 - 99 mg/dL   BUN 9 6 - 20 mg/dL   Creatinine, Ser 0.80 0.44 - 1.00 mg/dL   Calcium 9.0 8.9 - 10.3 mg/dL   Total Protein 7.1 6.5 - 8.1 g/dL   Albumin 3.7 3.5 - 5.0 g/dL  AST 42 (H) 15 - 41 U/L   ALT 24 0 - 44 U/L   Alkaline Phosphatase 76 38 - 126 U/L   Total Bilirubin 0.7 0.3 - 1.2 mg/dL   GFR calc non Af Amer >60 >60 mL/min   GFR calc Af Amer >60 >60 mL/min   Anion gap 11 5 - 15    Comment: Performed at [facility] [address], [place], [place] [address]  CBC WITH DIFFERENTIAL     Status: Abnormal   Collection Time: 06/15/18  7:44 AM  Result Value Ref Range   WBC 24.8 (H) 4.0 - 10.5 K/uL   RBC 5.52 (H) 3.87 - 5.11 MIL/uL   Hemoglobin 16.3 (H) 12.0 - 15.0 g/dL   HCT 50.6 (H) 36.0 - 46.0 %   MCV 91.7 80.0 - 100.0 fL   MCH 29.5 26.0 - 34.0 pg   MCHC 32.2 30.0 - 36.0 g/dL   RDW 18.7 (H) 11.5 - 15.5 %   Platelets 388 150 - 400 K/uL   nRBC 0.0 0.0 - 0.2 %   Neutrophils Relative % 91 %   Neutro Abs 22.4 (H) 1.7 - 7.7 K/uL   Lymphocytes Relative 3 %   Lymphs Abs 0.9 0.7 - 4.0 K/uL   Monocytes Relative 5 %   Monocytes Absolute 1.3 (H) 0.1 - 1.0 K/uL   Eosinophils Relative 0 %   Eosinophils Absolute 0.0 0.0 - 0.5 K/uL   Basophils Relative 0 %   Basophils Absolute 0.0 0.0 - 0.1 K/uL   Immature Granulocytes 1 %   Abs Immature Granulocytes 0.20 (H) 0.00 - 0.07 K/uL    Comment: Performed at [facility], [address], [place], [place] [address]  Brain natriuretic peptide     Status: None    Collection Time: 06/15/18  7:44 AM  Result Value Ref Range   B Natriuretic Peptide 49.0 0.0 - 100.0 pg/mL    Comment: Performed at [facility] [address], [place], [place] [address]  Lactic acid, plasma     Status: Abnormal   Collection Time: 06/15/18  7:46 AM  Result Value Ref Range   Lactic Acid, Venous 2.6 (HH) 0.5 - 1.9 mmol/L    Comment: CRITICAL RESULT CALLED TO, READ BACK BY AND VERIFIED WITHPercell Locus [id number] WILDERK Performed at [facility], [place] [address], [place], [place] [address]   Lactic acid, plasma     Status: Abnormal   Collection Time: 06/15/18  9:51 AM  Result Value Ref Range   Lactic Acid, Venous 2.1 (HH) 0.5 - 1.9 mmol/L    Comment: CRITICAL RESULT CALLED TO, READ BACK BY AND VERIFIED WITHPercell Locus [id number] WILDERK Performed at [facility], [place] [address] [place], [place] [address]    Dg Chest Port 1 View  Result Date: 06/15/2018 CLINICAL DATA:  SOB. EXAM: PORTABLE CHEST 1 VIEW COMPARISON:  12/22/2017. FINDINGS: The heart is enlarged. There is moderate vascular congestion. No frank pulmonary edema. Retrocardiac density could represent vascular crowding or possible infiltrate. Central catheter appears to terminate at the [facility] RA junction, similar to priors, although it is unclear where the proximal aspect of this catheter is located. IMPRESSION: Cardiomegaly with moderate vascular congestion. Retrocardiac density could represent vascular crowding or possible infiltrate. Follow-up with two view chest. Electronically Signed   By: Staci Righter M.D.   On: 06/15/2018 08:09    Pending Labs Unresulted Labs (From admission, onward)  Start     Ordered   06/16/18 3343  Basic metabolic panel  Tomorrow morning,   R     06/15/18 1044   06/16/18 0500  CBC  Tomorrow morning,   R     06/15/18 1044   06/15/18 1043  HIV antibody (Routine Testing)  Add-on,   AD     06/15/18 1044   06/15/18 0726  Urinalysis, Routine w reflex microscopic   Once,   STAT     06/15/18 0725   06/15/18 0726  Urine Culture  ONCE - STAT,   STAT     06/15/18 0725   06/15/18 0724  Blood Culture (routine x 2)  BLOOD CULTURE X 2,   STAT     06/15/18 0724          Vitals/Pain Today's Vitals   06/15/18 0800 06/15/18 0815 06/15/18 0845 06/15/18 0945  BP: (!) 102/48 (!) 110/92 106/68 100/66  Pulse: (!) 119 (!) 117 (!) 110 (!) 110  Resp: (!) 39 (!) 23 (!) 24 (!) 22  Temp:      TempSrc:      SpO2: 94% 91% 92% 91%  PainSc:        Isolation Precautions Droplet and Contact precautions  Medications Medications  cefTRIAXone (ROCEPHIN) 1 g in sodium chloride 0.9 % 100 mL IVPB (has no administration in time range)  azithromycin (ZITHROMAX) 250 mg in dextrose 5 % 125 mL IVPB (has no administration in time range)  pravastatin (PRAVACHOL) tablet 20 mg (has no administration in time range)  pantoprazole (PROTONIX) EC tablet 20 mg (has no administration in time range)  rivaroxaban (XARELTO) tablet 20 mg (has no administration in time range)  gabapentin (NEURONTIN) capsule 600 mg (has no administration in time range)  fluticasone (FLONASE) 50 MCG/ACT nasal spray 1 spray (has no administration in time range)  acetaminophen (TYLENOL) tablet 650 mg (has no administration in time range)    Or  acetaminophen (TYLENOL) suppository 650 mg (has no administration in time range)  ondansetron (ZOFRAN) tablet 4 mg (has no administration in time range)    Or  ondansetron (ZOFRAN) injection 4 mg (has no administration in time range)  insulin aspart (novoLOG) injection 0-9 Units (has no administration in time range)  acetaminophen (TYLENOL) tablet 650 mg (650 mg Oral Given 06/15/18 0759)  azithromycin (ZITHROMAX) 500 mg in sodium chloride 0.9 % 250 mL IVPB (0 mg Intravenous Stopped 06/15/18 0932)  cefTRIAXone (ROCEPHIN) 1 g in sodium chloride 0.9 % 100 mL IVPB (0 g Intravenous Stopped 06/15/18 1032)  sodium chloride 0.9 % bolus 500 mL (500 mLs Intravenous New Bag/Given  06/15/18 1032)    Mobility walks with device Low fall risk   Focused Assessments Pulmonary Assessment Handoff:  Lung sounds: Bilateral Breath Sounds: Diminished L Breath Sounds: Diminished R Breath Sounds: Diminished O2 Device: NRB O2 Flow Rate (L/min): 15 L/min      R Recommendations: See Admitting Provider Note  Report given to:   Additional Notes:

## 2018-06-15 NOTE — ED Triage Notes (Signed)
Patient arrived with EMS from home reports sudden onset SOB with productive cough/chest congestion this morning ( 3 am) , history of CHF/COPD/Pulmonary Hypertension. Denies fever or chills , denies sick contact with family members , her cardiologist is Dr. Missy Sabins.

## 2018-06-15 NOTE — ED Provider Notes (Addendum)
[facility] EMERGENCY DEPARTMENT Provider Note   CSN: [id number] Arrival date & time: 06/15/18  7096     History   Chief Complaint Chief Complaint  Patient presents with  . Shortness of Breath    CHF/COPD    HPI [name] is a 58 y.o. female.     58 year old female presents with fever and increased cough x24 hours.  Does have a history of right heart failure and denies any chest pain or chest pressure.  Is unsure if her weight has increased.  No vomiting or diarrhea.  Denies any urinary symptoms.  Also has a history of COPD.  Patient symptoms have gradually become worse over last 7 hours.  Patient chronically uses 4 L of oxygen.  Called EMS and placed on more oxygen and transported here.     Past Medical History:  Diagnosis Date  . Anemia, iron deficiency    secondary to menhorrhagia, on oral iron, also b12 def, getting monthly b12 shots  . CHF (congestive heart failure) ([place])   . Chronic cough    secondary to alleriges and post nasal drip  . Cognitive impairment   . COPD (chronic obstructive pulmonary disease) ([place])   . Cor pulmonale (HCC)    PA Peak pressure 4mHg  . Depression   . Diabetes mellitus    well controlled on metformin  . Diastolic heart failure ([place]   . GERD (gastroesophageal reflux disease)   . H/O mental retardation   . Herpes   . Hyperlipidemia   . Hypertension   . OSA (obstructive sleep apnea)    CPAP  . Pulmonary hypertension ([place]   . Renal disorder   . Restrictive lung disease    PFTs 06/2012 (FVC 54% predicted and FEV1 68% predicted w minimal bronchodilator response).  . Shortness of breath   . Venous stasis ulcer ([place]    chornic, ?followed up at wound care center, multiple courses of antibiotics in past for cellulitis, on lasix    Patient Active Problem List   Diagnosis Date Noted  . Abdominal muscle strain, initial encounter 06/07/2018  . Hematoma of arm, left, subsequent encounter 10/31/2017  . Ovarian  cyst 05/19/2017  . Lower extremity edema 02/25/2017  . Postmenopausal vaginal bleeding 03/21/2015  . Rash 10/31/2014  . Peripheral neuropathy ([facility] 09/27/2014  . Healthcare maintenance 09/27/2014  . Chronic diastolic CHF (congestive heart failure) ([facility] 06/25/2013  . Atrial flutter ([facility] 08/17/2012  . Pulmonary hypertension ([place] 08/16/2012  . Right heart failure, NYHA class 3 ([facility] 03/15/2012  . Allergic rhinitis 11/16/2011  . Diabetes ([place] 10/15/2010  . Hyperlipidemia 10/15/2010  . Obesity 10/14/2010  . Obstructive sleep apnea 02/21/2009  . GERD 11/29/2005  . ANEMIA, B12 DEFICIENCY 10/19/2005  . MENTAL RETARDATION 10/19/2005    Past Surgical History:  Procedure Laterality Date  . CARDIAC CATHETERIZATION N/A 01/13/2016   Procedure: Right Heart Cath;  Surgeon: DJolaine Artist MD;  [facility];  Service: Cardiovascular;  Laterality: N/A;  . CHOLECYSTECTOMY    . LEFT AND RIGHT HEART CATHETERIZATION WITH CORONARY ANGIOGRAM N/A 02/28/2014   Procedure: LEFT AND RIGHT HEART CATHETERIZATION WITH CORONARY ANGIOGRAM;  Surgeon: DJolaine Artist MD;  [facility];  Service: Cardiovascular;  Laterality: N/A;     OB History    Gravida  1   Para  1   Term  1   Preterm      AB      Living  1  SAB      TAB      Ectopic      Multiple      Live Births               Home Medications    Prior to Admission medications   Medication Sig Start Date End Date Taking? Authorizing Provider  ACCU-CHEK AVIVA PLUS test strip USE 1 STRIP TO CHECK GLUCOSE ONCE DAILY 05/25/18   [name], MD  acetaminophen (TYLENOL) 500 MG tablet Take 1 tablet (500 mg total) by mouth every 6 (six) hours as needed. 08/21/15   [name], MD  cetirizine (ZYRTEC) 10 MG tablet Take 1 tablet (10 mg total) by mouth daily. 05/05/18   [name], MD  cyanocobalamin 500 MCG tablet Take 500 mcg by mouth daily.    [provider]  cyclobenzaprine  (FLEXERIL) 5 MG tablet Take 1 tablet (5 mg total) by mouth daily as needed for muscle spasms. 06/07/18   Neva Seat, MD  diclofenac sodium (VOLTAREN) 1 % GEL Apply 2 g topically 4 (four) times daily. 06/07/18   Neva Seat, MD  diphenhydrAMINE (BENADRYL) 25 mg capsule Take 25-75 mg by mouth 2 (two) times daily as needed for itching.     [provider]  doxycycline (VIBRA-TABS) 100 MG tablet Take 1 tablet (100 mg total) by mouth 2 (two) times daily. For 10 days. 02/16/18   Shirley Friar, PA-C  ferrous sulfate 325 (65 FE) MG tablet Take 1 tablet (325 mg total) by mouth daily with breakfast. 07/23/14   Corky Sox, MD  fluticasone [facility]) 50 MCG/ACT nasal spray Place 1 spray into both nostrils daily. 01/05/18 01/05/19  Mosetta Anis, MD  gabapentin (NEURONTIN) 300 MG capsule TAKE 2 CAPSULES BY MOUTH THREE TIMES DAILY 09/26/17   [name], MD  metFORMIN (GLUCOPHAGE) 500 MG tablet TAKE ONE-HALF TABLET BY MOUTH WITH BREAKFAST 04/16/17   [name], MD  mupirocin ointment (BACTROBAN) 2 % Place 1 application into the nose 2 (two) times daily. 02/25/17   Ledell Noss, MD  nystatin cream (MYCOSTATIN) Apply 1 application topically 2 (two) times daily. 08/20/16   Constant, Peggy, MD  pantoprazole (PROTONIX) 20 MG tablet Take 1 tablet (20 mg total) by mouth 2 (two) times daily. 10/05/17   [name], MD  potassium chloride SA (K-DUR,KLOR-CON) 20 MEQ tablet TAKE 2 TABLETS BY MOUTH THREE TIMES DAILY TAKE  AN  EXTRA  TABLET  WHEN  TAKING  AN  EXTRA  TORSEMIDE 12/05/17   [name], MD  pravastatin (PRAVACHOL) 20 MG tablet TAKE 1 TABLET BY MOUTH ONCE DAILY 12/05/17   [name], MD  sildenafil (REVATIO) 20 MG tablet Take 1 tablet (20 mg total) by mouth 3 (three) times daily. 12/29/17   Bensimhon, [name], MD  sodium chloride (OCEAN) 0.65 % SOLN nasal spray Place 2 sprays into both nostrils as needed for congestion. 02/12/16   [name], MD  spironolactone  (ALDACTONE) 25 MG tablet Take 1/2 (one-half) tablet by mouth once daily 05/19/18   [name], MD  torsemide (DEMADEX) 20 MG tablet Take 2 tablets (40 mg total) by mouth 2 (two) times daily. 07/05/17   Clegg, Amy D, NP  treprostinil (REMODULIN) 5 MG/ML SOLN injection Inject 6 ng/mL into the skin continuous.     [provider]  XARELTO 20 MG TABS tablet TAKE 1 TABLET BY MOUTH ONCE DAILY WITH SUPPER 02/20/18   [name],  MD    Family History Family History  Problem Relation Age of Onset  . Mental illness Sister   . Mental retardation Brother   . Breast cancer Maternal Aunt     Social History Social History   Tobacco Use  . Smoking status: Never Smoker  . Smokeless tobacco: Never Used  Substance Use Topics  . Alcohol use: No    Alcohol/week: 0.0 standard drinks  . Drug use: No     Allergies   Aspirin, Codeine, Lisinopril, Sulfonamide derivatives, and Latex   Review of Systems Review of Systems  All other systems reviewed and are negative.    Physical Exam Updated Vital Signs BP 128/68 (BP Location: Right Arm)   Pulse (!) 133   Temp 99.4 F (37.4 C) (Oral)   Resp (!) 22   LMP 02/19/2011   SpO2 92%   Physical Exam Vitals signs and nursing note reviewed.  Constitutional:      General: She is not in acute distress.    Appearance: Normal appearance. She is well-developed. She is not toxic-appearing.  HENT:     Head: Normocephalic and atraumatic.  Eyes:     General: Lids are normal.     Conjunctiva/sclera: Conjunctivae normal.     Pupils: Pupils are equal, round, and reactive to light.  Neck:     Musculoskeletal: Normal range of motion and neck supple.     Thyroid: No thyroid mass.     Trachea: No tracheal deviation.  Cardiovascular:     Rate and Rhythm: Regular rhythm. Tachycardia present.     Heart sounds: Normal heart sounds. No murmur. No gallop.   Pulmonary:     Effort: Tachypnea, prolonged expiration and respiratory distress  present.     Breath sounds: No stridor. Examination of the right-lower field reveals decreased breath sounds. Examination of the left-lower field reveals decreased breath sounds. Decreased breath sounds present. No wheezing, rhonchi or rales.  Abdominal:     General: Bowel sounds are normal. There is no distension.     Palpations: Abdomen is soft.     Tenderness: There is no abdominal tenderness. There is no rebound.  Musculoskeletal: Normal range of motion.        General: No tenderness.  Skin:    General: Skin is warm and dry.     Findings: No abrasion or rash.  Neurological:     Mental Status: She is alert and oriented to person, place, and time.     GCS: GCS eye subscore is 4. GCS verbal subscore is 5. GCS motor subscore is 6.     Cranial Nerves: No cranial nerve deficit.     Sensory: No sensory deficit.  Psychiatric:        Speech: Speech normal.        Behavior: Behavior normal.      ED Treatments / Results  Labs (all labs ordered are listed, but only abnormal results are displayed) Labs Reviewed  CULTURE, BLOOD (ROUTINE X 2)  CULTURE, BLOOD (ROUTINE X 2)  SARS CORONAVIRUS 2 (HOSPITAL ORDER, [facility])  URINE CULTURE  LACTIC ACID, PLASMA  LACTIC ACID, PLASMA  COMPREHENSIVE METABOLIC PANEL  CBC WITH DIFFERENTIAL/PLATELET  URINALYSIS, ROUTINE W REFLEX MICROSCOPIC  BRAIN NATRIURETIC PEPTIDE  URINALYSIS, ROUTINE W REFLEX MICROSCOPIC    EKG None   ED ECG REPORT   Date: 06/26/2018  Rate: 109  Rhythm: sinus tachycardia  QRS Axis: normal  Intervals: normal  ST/T Wave abnormalities: nonspecific ST changes  Conduction Disutrbances:none  Narrative Interpretation:   Old EKG Reviewed: none available  I have personally reviewed the EKG tracing and agree with the computerized printout as noted.   Radiology No results found.  Procedures Procedures (including critical care time)  Medications Ordered in ED Medications - No data to  display   Initial Impression / Assessment and Plan / ED Course  I have reviewed the triage vital signs and the nursing notes.  Pertinent labs & imaging results that were available during my care of the patient were reviewed by me and considered in my medical decision making (see chart for details).        Patient with evidence of pneumonia on chest x-ray.  Started on IV antibiotics.  Significant leukocytosis noted and blood cultures obtained.  Lactate noted.  Patient's COVID test negative.  Will be admitted to the internal teaching service\  CRITICAL CARE Performed by: Leota Jacobsen Total critical care time: 50 minutes Critical care time was exclusive of separately billable procedures and treating other patients. Critical care was necessary to treat or prevent imminent or life-threatening deterioration. Critical care was time spent personally by me on the following activities: development of treatment plan with patient and/or surrogate as well as nursing, discussions with consultants, evaluation of patient's response to treatment, examination of patient, obtaining history from patient or surrogate, ordering and performing treatments and interventions, ordering and review of laboratory studies, ordering and review of radiographic studies, pulse oximetry and re-evaluation of patient's condition.  [name] was evaluated in Emergency Department on 06/15/2018 for the symptoms described in the history of present illness. She was evaluated in the context of the global COVID-19 pandemic, which necessitated consideration that the patient might be at risk for infection with the SARS-CoV-2 virus that causes COVID-19. Institutional protocols and algorithms that pertain to the evaluation of patients at risk for COVID-19 are in a state of rapid change based on information released by regulatory bodies including the CDC and federal and state organizations. These policies and algorithms were followed  during the patient's care in the ED.    Final Clinical Impressions(s) / ED Diagnoses   Final diagnoses:  None    ED Discharge Orders    None       Lacretia Leigh, MD 06/15/18 0935    Lacretia Leigh, MD 06/26/18 1057

## 2018-06-15 NOTE — ED Notes (Signed)
Attempted report

## 2018-06-15 NOTE — ED Notes (Signed)
Pt placed on 4 liters n/c sats down to 86 % pt placed back on NRB

## 2018-06-15 NOTE — Consult Note (Addendum)
Advanced Heart Failure Team Consult Note   Primary Physician: [name], MD PCP-Cardiologist:  No primary care provider on file.  Reason for Consultation: Pulmonary Hypertension   HPI:    [name] is seen today for evaluation of pulmonary hypertension at the request of Dr Rebeca Alert.   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   She has been on IV remodulin since 2014 and has been followed [facility] for PAH.   Today she presented to [facility] via EMS with chills, rigors, and cough. increased dyspnea. Prior to admit she was not using her oxygen consistently. In the ED sats were low so she was placed on NRB. CXR was concerning for vascular congestion and possible infiltrate.  Pertinent lab work included WBC 24.8, Hgb 16, lactic acid 2.6, Covid 19 was negative. In the ED she was given 500 cc NS bolus and started on azithromycin and ceftriaxone.    ECHO 06/2017 at [facility]: RV severely [place] 1/18  [place] 2018  RA = 7 RV = 72/9 PA = 70/31 (47) PCW = 11 Fick cardiac output/index = 7.4/3.3 PVR = 4.9 WU FA sat = 90% PA sat = 69%, 68%  Review of Systems: [y] = yes, _0  = no   . General: Weight gain _1 ; Weight loss _2 ; Anorexia _3 ; Fatigue [Y ]; Fever [Y ]; Chills [ Y]; Weakness [Y ]  . Cardiac: Chest pain/pressure _4 ; Resting SOB [Y ]; Exertional SOB _5 ; Orthopnea _6 ; Pedal Edema _7 ; Palpitations _8 ; Syncope _9 ; Presyncope _10 ; Paroxysmal nocturnal dyspnea_11   . Pulmonary: Cough [Y ]; Wheezing_12 ; Hemoptysis_13 ; Sputum _14 ; Snoring _15   . GI: Vomiting_16 ; Dysphagia_17 ; Melena_18 ; Hematochezia _19 ; Heartburn_20 ; Abdominal pain _21 ; Constipation _22 ; Diarrhea _23 ; BRBPR _24   . GU: Hematuria_25 ;  Dysuria _26 ; Nocturia_27   . Vascular: Pain in legs with walking _28 ; Pain in feet with lying flat _29 ; Non-healing sores _30 ; Stroke _31 ; TIA _32 ; Slurred speech _33 ;  . Neuro: Headaches_34 ; Vertigo_35 ; Seizures_36 ; Paresthesias_37 ;Blurred vision _38 ; Diplopia _39 ; Vision changes _40   . Ortho/Skin: Arthritis [Y ]; Joint pain [Y ]; Muscle pain _41 ; Joint swelling _42 ; Back Pain [Y ]; Rash _43   . Psych: Depression_44 ; Anxiety_45   . Heme: Bleeding problems _46 ; Clotting disorders _47 ; Anemia _48   . Endocrine: Diabetes [ Y]; Thyroid dysfunction_49   Home Medications Prior to Admission medications   Medication Sig Start Date End Date Taking? Authorizing Provider  acetaminophen (TYLENOL) 500 MG tablet Take 1 tablet (500 mg total) by mouth every 6 (six) hours as needed. 08/21/15  Yes [name], MD  cetirizine (ZYRTEC) 10 MG tablet Take 1 tablet (10 mg total) by mouth daily. 05/05/18  Yes [name], MD  cyanocobalamin 500 MCG tablet Take 500 mcg by mouth daily.   Yes [provider]  cyclobenzaprine (FLEXERIL) 5 MG tablet Take 1 tablet (5 mg total) by mouth daily as needed for muscle spasms. 06/07/18  Yes Neva Seat, MD  diphenhydrAMINE (BENADRYL) 25 mg capsule  Take 25-75 mg by mouth 2 (two) times daily as needed for itching.    Yes [provider]  ferrous sulfate 325 (65 FE) MG tablet Take 1 tablet (325 mg total) by mouth daily with breakfast. 07/23/14  Yes Corky Sox, MD  fluticasone (FLONASE) 50 MCG/ACT nasal spray Place 1 spray into both nostrils daily. Patient taking differently: Place 1 spray into both nostrils as needed for allergies or rhinitis.  01/05/18 01/05/19 Yes Mosetta Anis, MD  gabapentin (NEURONTIN) 300 MG capsule TAKE 2 CAPSULES BY MOUTH THREE TIMES DAILY Patient taking differently: Take 600 mg by mouth 3 (three) times daily.  09/26/17  Yes [name], MD  metFORMIN (GLUCOPHAGE) 500 MG tablet TAKE ONE-HALF TABLET BY MOUTH WITH BREAKFAST Patient  taking differently: Take 250 mg by mouth daily with breakfast.  04/16/17  Yes Winfrey, Jenne Pane, MD  pantoprazole (PROTONIX) 20 MG tablet Take 1 tablet (20 mg total) by mouth 2 (two) times daily. 10/05/17  Yes [name], MD  potassium chloride SA (K-DUR,KLOR-CON) 20 MEQ tablet TAKE 2 TABLETS BY MOUTH THREE TIMES DAILY TAKE  AN  EXTRA  TABLET  WHEN  TAKING  AN  EXTRA  TORSEMIDE Patient taking differently: Take 40 mEq by mouth 3 (three) times daily. TAKE  AN  EXTRA  TABLET  WHEN  TAKING  AN  EXTRA  TORSEMIDE 12/05/17  Yes Winfrey, Jenne Pane, MD  pravastatin (PRAVACHOL) 20 MG tablet TAKE 1 TABLET BY MOUTH ONCE DAILY Patient taking differently: Take 20 mg by mouth daily.  12/05/17  Yes [name], MD  sildenafil (REVATIO) 20 MG tablet Take 1 tablet (20 mg total) by mouth 3 (three) times daily. 12/29/17  Yes Rateel Beldin, [name], MD  sodium chloride (OCEAN) 0.65 % SOLN nasal spray Place 2 sprays into both nostrils as needed for congestion. 02/12/16  Yes [name], MD  spironolactone (ALDACTONE) 25 MG tablet Take 1/2 (one-half) tablet by mouth once daily Patient taking differently: Take 12.5 mg by mouth daily.  05/19/18  Yes [name], MD  torsemide (DEMADEX) 20 MG tablet Take 2 tablets (40 mg total) by mouth 2 (two) times daily. 07/05/17  Yes Clegg, Amy D, NP  treprostinil (REMODULIN) 5 MG/ML SOLN injection Inject 6 ng/mL into the skin continuous.    Yes [provider]  XARELTO 20 MG TABS tablet TAKE 1 TABLET BY MOUTH ONCE DAILY WITH SUPPER Patient taking differently: Take 20 mg by mouth daily with supper.  02/20/18  Yes [name], MD  ACCU-CHEK AVIVA PLUS test strip USE 1 STRIP TO CHECK GLUCOSE ONCE DAILY 05/25/18   [name], MD  diclofenac sodium (VOLTAREN) 1 % GEL Apply 2 g topically 4 (four) times daily. 06/07/18   Neva Seat, MD  mupirocin ointment (BACTROBAN) 2 % Place 1 application into the nose 2 (two) times daily. Patient not taking: Reported  on 06/15/2018 02/25/17   Ledell Noss, MD  nystatin cream (MYCOSTATIN) Apply 1 application topically 2 (two) times daily. Patient not taking: Reported on 06/15/2018 08/20/16   Constant, Peggy, MD    Past Medical History: Past Medical History:  Diagnosis Date  . Anemia, iron deficiency    secondary to menhorrhagia, on oral iron, also b12 def, getting monthly b12 shots  . CHF (congestive heart failure) ([place])   . Chronic cough    secondary to alleriges and post nasal drip  . Cognitive impairment   . COPD (chronic obstructive pulmonary disease) ([place])   .  Cor pulmonale (HCC)    PA Peak pressure 51mHg  . Depression   . Diabetes mellitus    well controlled on metformin  . Diastolic heart failure ([place]   . GERD (gastroesophageal reflux disease)   . H/O mental retardation   . Herpes   . Hyperlipidemia   . Hypertension   . OSA (obstructive sleep apnea)    CPAP  . Pulmonary hypertension ([place]   . Renal disorder   . Restrictive lung disease    PFTs 06/2012 (FVC 54% predicted and FEV1 68% predicted w minimal bronchodilator response).  . Shortness of breath   . Venous stasis ulcer ([place]    chornic, ?followed up at wound care center, multiple courses of antibiotics in past for cellulitis, on lasix    Past Surgical History: Past Surgical History:  Procedure Laterality Date  . CARDIAC CATHETERIZATION N/A 01/13/2016   Procedure: Right Heart Cath;  Surgeon: DJolaine Artist MD;  [place];  Service: Cardiovascular;  Laterality: N/A;  . CHOLECYSTECTOMY    . LEFT AND RIGHT HEART CATHETERIZATION WITH CORONARY ANGIOGRAM N/A 02/28/2014   Procedure: LEFT AND RIGHT HEART CATHETERIZATION WITH CORONARY ANGIOGRAM;  Surgeon: DJolaine Artist MD;  [facility];  Service: Cardiovascular;  Laterality: N/A;    Family History: Family History  Problem Relation Age of Onset  . Mental illness Sister   . Mental retardation Brother   . Breast cancer Maternal Aunt     Social  History: Social History   Socioeconomic History  . Marital status: Single    Spouse name: Not on file  . Number of children: Not on file  . Years of education: Not on file  . Highest education level: Not on file  Occupational History  . Not on file  Social Needs  . Financial resource strain: Not on file  . Food insecurity    Worry: Not on file    Inability: Not on file  . Transportation needs    Medical: Not on file    Non-medical: Not on file  Tobacco Use  . Smoking status: Never Smoker  . Smokeless tobacco: Never Used  Substance and Sexual Activity  . Alcohol use: No    Alcohol/week: 0.0 standard drinks  . Drug use: No  . Sexual activity: Never  Lifestyle  . Physical activity    Days per week: Not on file    Minutes per session: Not on file  . Stress: Not on file  Relationships  . Social [place] phone: Not on file    Gets together: Not on file    Attends religious service: Not on file    Active member of club or organization: Not on file    Attends meetings of clubs or organizations: Not on file    Relationship status: Not on file  Other Topics Concern  . Not on file  Social History Narrative  . Not on file    Allergies:  Allergies  Allergen Reactions  . Aspirin     REACTION: airway swelling  . Codeine     REACTION: tingling in lips and hard breathing - had reaction at dentist - states "I can't take certain kinds of codeine" - happened maybe 10 yr ago  . Lisinopril     Cough  . Sulfonamide Derivatives     REACTION: airway swelling  . Latex Rash    Objective:    Vital Signs:   Temp:  [98.9 F (  37.2 C)-99.4 F (37.4 C)] 98.9 F (37.2 C) (06/11 1235) Pulse Rate:  [101-133] 125 (06/11 1235) Resp:  [22-39] 30 (06/11 1235) BP: (96-128)/(48-92) 102/58 (06/11 1235) SpO2:  [91 %-95 %] 93 % (06/11 1129) FiO2 (%):  [55 %-96 %] 55 % (06/11 1235) Weight:  [126.3 kg] 126.3 kg (06/11 1235)    Weight change: Filed Weights   06/15/18 1235   Weight: 126.3 kg    Intake/Output:  No intake or output data in the 24 hours ending 06/15/18 1349    Physical Exam    General:  Ill appearing. On NRB HEENT: normal Neck: supple. JVP hard to see Looks elevted. Carotids 2+ bilat; no bruits. No lymphadenopathy or thyromegaly appreciated. Cor: PMI nondisplaced. Tachy regular. +RV lift. +hickman catheter Lungs: + rhonchi Abdomen: obese soft, nontender, nondistended. No hepatosplenomegaly. No bruits or masses. Good bowel sounds. Extremities: no cyanosis, clubbing, rash, 1+ edema Very erythematous and warm  Neuro: alert & orientedx3, cranial nerves grossly intact. moves all 4 extremities w/o difficulty. Affect pleasant   Telemetry   Sinus Tach 105-120  Personally reviewed  EKG    Sinus Tach 109 bpm RAD. No acute ST-T wave changes. Personally reviewed   Labs   Basic Metabolic Panel: Recent Labs  Lab 06/15/18 0744  NA 138  K 4.1  CL 103  CO2 24  GLUCOSE 118*  BUN 9  CREATININE 0.80  CALCIUM 9.0    Liver Function Tests: Recent Labs  Lab 06/15/18 0744  AST 42*  ALT 24  ALKPHOS 76  BILITOT 0.7  PROT 7.1  ALBUMIN 3.7   No results for input(s): LIPASE, AMYLASE in the last 168 hours. No results for input(s): AMMONIA in the last 168 hours.  CBC: Recent Labs  Lab 06/15/18 0744  WBC 24.8*  NEUTROABS 22.4*  HGB 16.3*  HCT 50.6*  MCV 91.7  PLT 388    Cardiac Enzymes: No results for input(s): CKTOTAL, CKMB, CKMBINDEX, TROPONINI in the last 168 hours.  BNP: BNP (last 3 results) Recent Labs    12/22/17 1247 06/15/18 0744  BNP 47.4 49.0    ProBNP (last 3 results) No results for input(s): PROBNP in the last 8760 hours.   CBG: No results for input(s): GLUCAP in the last 168 hours.  Coagulation Studies: No results for input(s): LABPROT, INR in the last 72 hours.   Imaging   Dg Chest Port 1 View  Result Date: 06/15/2018 CLINICAL DATA:  SOB. EXAM: PORTABLE CHEST 1 VIEW COMPARISON:  12/22/2017.  FINDINGS: The heart is enlarged. There is moderate vascular congestion. No frank pulmonary edema. Retrocardiac density could represent vascular crowding or possible infiltrate. Central catheter appears to terminate at the [facility] RA junction, similar to priors, although it is unclear where the proximal aspect of this catheter is located. IMPRESSION: Cardiomegaly with moderate vascular congestion. Retrocardiac density could represent vascular crowding or possible infiltrate. Follow-up with two view chest. Electronically Signed   By: Staci Righter M.D.   On: 06/15/2018 08:09      Medications:     Current Medications: . gabapentin  600 mg Oral TID  . insulin aspart  0-9 Units Subcutaneous TID WC  . pantoprazole  20 mg Oral BID  . pravastatin  20 mg Oral Daily  . rivaroxaban  20 mg Oral Q supper     Infusions: . [START ON 06/16/2018] azithromycin    . [START ON 06/16/2018] cefTRIAXone (ROCEPHIN)  IV    . treprostinil (REMODULIN) infusion  Assessment/Plan   1) Acute on chronic hypoxic respiratory failure due to CAP CXR concerning for possible infiltrate.WBC 24k.  Blood Cultures pending.  Started on azithromycin and ceftriaxone.    2. Chronic diastolic HF/cor pulmonale:  - Hold torsemide and spiro for now with concern for shock.  - Would carefully give IV fluids. She will quickly decompensate with if she get volume overloaded.   3. 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 However PAH far out of proportion to L-sided pressures or restrictive lung disease. - Follows with [facility]. Last seen 10/2017 - Remains on Remodulin. Cannot stop remodulin due to rebound pulmonary hypertensin.  - Hold sildenafil for now. Will need to restart prior to discharge.   4. Paroxysmal atrial flutter.  - In Sinus Tach today.  -  Continue Xarelto.Denies bleeding.  5. Obesity    Length of Stay: 0  Darrick Grinder, NP  06/15/2018, 1:49 PM  Advanced Heart  Failure Team Pager [phone] (M-F; [place])  Please contact [facility] for night-coverage after hours (4p -7a ) and weekends on [email or web address]  Patient seen and examined with the above-signed Advanced Practice Provider and/or Housestaff. I personally reviewed laboratory data, imaging studies and relevant notes. I independently examined the patient and formulated the important aspects of the plan. I have edited the note to reflect any of my changes or salient points. I have personally discussed the plan with the patient and/or family.  58 y/o woman with obesity, cognitive limitations, chronic respiratory failure, PAH with severe cor pulmonale maintained on IV [place] at [facility].   Has been doing quite well with NYHA II symptoms.   Admitted with CAP with possible retrocardiac infiltrate on CXR (Personally reviewed) and early sepsis. Has received abx and IVF. BP now stable. Lactate 2.1. HR now improved 120 -> 100. WBC 24.8k. COVID negative  On exam  Ill appearing on NRB HEENT: normal Neck: supple. Hard to see JVP Carotids 2+ bilat; no bruits. No lymphadenopathy or thryomegaly appreciated. Cor: PMI nondisplaced. Regular tachyNo rubs, gallops or murmurs. Lungs: + rhonchi Abdomen: obese soft, nontender, nondistended. No hepatosplenomegaly. No bruits or masses. Good bowel sounds. Extremities: no cyanosis, clubbing, rash, 1-2+ edema veryr erythematous and warm  Neuro: alert & orientedx3, cranial nerves grossly intact. moves all 4 extremities w/o difficulty. Affect pleasant  She appears to have CAP and early sepsis. But I am also concerned about bilateral lower extremity cellulitis. Responding well to IV abx and IVF. Agree with holding torsemide, spiro and sildenafil. Continue remodulin (do not stop if at all possible due to risk of rebound PAH). Continue broad spectrum abx. Check PCT. Follow cultures (blood and sputum). Given concern for cellulitis would add Vanc. I will d/w primary team.   We will follow  closely.   Glori Bickers, MD  4:21 PM   .

## 2018-06-15 NOTE — Progress Notes (Signed)
Hypoglycemic Event  CBG: 69  Treatment: D50 25 mL (12.5 gm)  Symptoms: None  Follow-up CBG: Time:2202 CBG Result:106  Possible Reasons for Event: Inadequate meal intake  Comments/MD notified: Dr. Laural Golden notified    [facility]

## 2018-06-15 NOTE — H&P (Signed)
Date: 06/15/2018               Patient Name:  [name] MRN: [id number]  DOB: 02-20-1960 Age / Sex: 58 y.o., female   PCP: Faith Roan, MD         Medical Service: Internal Medicine Teaching Service         Attending Physician: Dr. Rebeca Alert Raynaldo Opitz, MD    First Contact: Dr. Eileen Barker Pager: [phone]  Second Contact: Dr. Berline Barker Pager: [phone]       After Hours (After 5p/  First Contact Pager: [phone]  weekends / holidays): Second Contact Pager: [phone]   Chief Complaint: productive cough  History of Present Illness:  [name] is a 58yo female with PMH of multifactorial PAH with cor pulmonale, chronic hypoxic respiratory failure on 4L [place] at home, chronic venous stasis, paroxismal a fib, T2DM, OSA on CPAP, dCHF, presenting with productive cough.  Patient states she was overall in her usual state of health until last night about 3 am when she started having chills; about an hour later she continued to feel chills with rigors and started having a new cough productive of clear but thick/lumpy sputum, nausea w/o vomiting. She reports a lot of nasal congestion overnight that worsened as the rest of her symptoms progressed. When her symptoms didn't improve she called EMS and was transferred to [facility] for evaluation.  She states that as she is usually in her bedroom and fairly sedentary without symptoms of shortness of breath at baseline, she doesn't use her oxygen which is supposed to be used continuously (she confirms this recommendation). She doesn't have an oxymeter at home. She didn't attempt to place her oxygen as her symptoms were developing but did try her CPAP, however nothing was helping calm her rigors and cough.   She reports compliance with all of her medications including xarelto. She doesn't weigh herself. She denies any frank signs of bleeding. She thinks she might be having somewhat looser stools than usual for her but it's difficult to tell; she endorses at  times almost black stools that she thinks are related to what she eats. She denies chest pain, shortness of breath, changes in her LE swelling or chronic venous stasis dermatitis, calf pain, hemoptysis, rash, sick contacts.   In the ED she was initially on nonrebreather by EMS during transport; on arrival they attempted to wean her back to her home oxygen however she desaturated to 86% so was transitioned back to NRB. Temperature was 99.30F, initial BP 114/63, HR 126, RR 35. Cmet was unremarkable with bicarb of 24. CBC revealed WBC 24.8, Hgb 16.3, plts 388. Lactic acid was 2.6. CXR showed some vascular congestion and a likely retrocardiac infiltrate. EKG revealed sinus tachycardia with wander but no notable ischemic changes. Patient was started on azithromycin and ceftriaxone and IMTS asked to admit.   Meds:  Current Meds  Medication Sig  . acetaminophen (TYLENOL) 500 MG tablet Take 1 tablet (500 mg total) by mouth every 6 (six) hours as needed.  . cetirizine (ZYRTEC) 10 MG tablet Take 1 tablet (10 mg total) by mouth daily.  . cyanocobalamin 500 MCG tablet Take 500 mcg by mouth daily.  . cyclobenzaprine (FLEXERIL) 5 MG tablet Take 1 tablet (5 mg total) by mouth daily as needed for muscle spasms.  . diphenhydrAMINE (BENADRYL) 25 mg capsule Take 25-75 mg by mouth 2 (two) times daily as needed for itching.   . ferrous sulfate 325 (65 FE)  MG tablet Take 1 tablet (325 mg total) by mouth daily with breakfast.  . fluticasone (FLONASE) 50 MCG/ACT nasal spray Place 1 spray into both nostrils daily. (Patient taking differently: Place 1 spray into both nostrils as needed for allergies or rhinitis. )  . gabapentin (NEURONTIN) 300 MG capsule TAKE 2 CAPSULES BY MOUTH THREE TIMES DAILY (Patient taking differently: Take 600 mg by mouth 3 (three) times daily. )  . metFORMIN (GLUCOPHAGE) 500 MG tablet TAKE ONE-HALF TABLET BY MOUTH WITH BREAKFAST (Patient taking differently: Take 250 mg by mouth daily with breakfast. )   . pantoprazole (PROTONIX) 20 MG tablet Take 1 tablet (20 mg total) by mouth 2 (two) times daily.  . potassium chloride SA (K-DUR,KLOR-CON) 20 MEQ tablet TAKE 2 TABLETS BY MOUTH THREE TIMES DAILY TAKE  AN  EXTRA  TABLET  WHEN  TAKING  AN  EXTRA  TORSEMIDE (Patient taking differently: Take 40 mEq by mouth 3 (three) times daily. TAKE  AN  EXTRA  TABLET  WHEN  TAKING  AN  EXTRA  TORSEMIDE)  . pravastatin (PRAVACHOL) 20 MG tablet TAKE 1 TABLET BY MOUTH ONCE DAILY (Patient taking differently: Take 20 mg by mouth daily. )  . sildenafil (REVATIO) 20 MG tablet Take 1 tablet (20 mg total) by mouth 3 (three) times daily.  . sodium chloride (OCEAN) 0.65 % SOLN nasal spray Place 2 sprays into both nostrils as needed for congestion.  Marland Kitchen spironolactone (ALDACTONE) 25 MG tablet Take 1/2 (one-half) tablet by mouth once daily (Patient taking differently: Take 12.5 mg by mouth daily. )  . torsemide (DEMADEX) 20 MG tablet Take 2 tablets (40 mg total) by mouth 2 (two) times daily.  Marland Kitchen treprostinil (REMODULIN) 5 MG/ML SOLN injection Inject 6 ng/mL into the skin continuous.   [name] 20 MG TABS tablet TAKE 1 TABLET BY MOUTH ONCE DAILY WITH SUPPER (Patient taking differently: Take 20 mg by mouth daily with supper. )   Allergies: Allergies as of 06/15/2018 - Review Complete 06/15/2018  Allergen Reaction Noted  . Aspirin  12/09/2005  . Codeine    . Lisinopril  05/02/2012  . Sulfonamide derivatives  10/19/2005  . Latex Rash 01/07/2016   Past Medical History:  Diagnosis Date  . Anemia, iron deficiency    secondary to menhorrhagia, on oral iron, also b12 def, getting monthly b12 shots  . CHF (congestive heart failure) ([place])   . Chronic cough    secondary to alleriges and post nasal drip  . Cognitive impairment   . COPD (chronic obstructive pulmonary disease) ([place])   . Cor pulmonale (HCC)    PA Peak pressure 23mHg  . Depression   . Diabetes mellitus    well controlled on metformin  . Diastolic heart failure  ([place]   . GERD (gastroesophageal reflux disease)   . H/O mental retardation   . Herpes   . Hyperlipidemia   . Hypertension   . OSA (obstructive sleep apnea)    CPAP  . Pulmonary hypertension ([place]   . Renal disorder   . Restrictive lung disease    PFTs 06/2012 (FVC 54% predicted and FEV1 68% predicted w minimal bronchodilator response).  . Shortness of breath   . Venous stasis ulcer (HCC)    chornic, ?followed up at wound care center, multiple courses of antibiotics in past for cellulitis, on lasix    Family History: No significant family history of lung or heart disease  Social History: Lives with her son and another roommate. Denies tobacco, alcohol or  illicit drug use.   Review of Systems: A complete ROS was negative except as per HPI.   Physical Exam: Blood pressure 100/66, pulse (!) 110, temperature 99.4 F (37.4 C), temperature source Oral, resp. rate (!) 22, last menstrual period 02/19/2011, SpO2 91 %. GENERAL- alert, co-operative, appears as stated age. HEENT- Atraumatic, normocephalic, oral mucosa appears dry CARDIAC- tachycardic, RR, no murmurs, rubs or gallops. RESP- Mild increased work of breathing, speaking 5-7 words at time. Moving equal volumes of air, right basilar rales present, no wheezing or rhonchi appreciated. ABDOMEN- Soft, nontender, bowel sounds present. NEURO- No obvious Cr N abnormality. EXTREMITIES- pulse 2+, symmetric, no pedal edema. SKIN- Very warm, chronic venous stasis changes to bil LEs without open wounds. PSYCH- Normal mood and affect, appropriate thought content and speech.  EKG: personally reviewed my interpretation is tachycardia, baseline wander thought multiple leads but no obvious ST or TW changes compared to priors  CXR: personally reviewed my interpretation is cardiomegaly, central congestion, blurring of right cardiac border; CVC ending in RA  Assessment & Plan by Problem: Active Problems:   Obstructive sleep apnea   Diabetes  (HCC)   Right heart failure, NYHA class 3 (HCC)   Pulmonary hypertension (HCC)   Chronic diastolic CHF (congestive heart failure) (HCC)   Acute on chronic respiratory failure (HCC)  Acute on chronic hypoxic respiratory failure Community acquired PNA: Patient with chills, rigors, new productive cough with increased oxygen requirement new leukocytosis, tachycardia, hypotension, and CXR concerning for retrocardiac infiltrate most consistent with diagnosis of CAP. She endorses compliance with anticoagulation making PE less likely.  --admit to progressive unit --500cc bolus for now; repeat LA and monitor VS; may benefit from further fluids --continue NRB for now and wean as able; may be a candidate for HFNC  --continue azithromycin and ceftriaxone --f/u BCx; higher risk as she has a chronic CVC for remodulin infusion --f/u AM labs --will attempt PA and lateral CXR in AM --patient confirmed if her respiratory status were to worsen she would want intubation but would not want long-term dependence on ventilator  Pulmonary hypertension, chronic cor pulmonale Chronic diastolic CHF: Patient supposed to be on 4L [place] continuously however does not use it as she doesn't feel dyspneic most of the time. She follows with [facility] pulmonology for management with sildenafil and remodulin infusion. She also follows with Dr. Sung Amabile locally. Dry weight 260lbs/ 118kgs however has not been at this weight for some time. --hold sildenafil for now due to lower BP; restart when more stable --continue remodulin infusion - consulted pharmacy for help in management of her infusion pump --hold torsemide and spironolactone in setting of lower BP and overall appearing volume down --strict ins/outs, daily weights  OSA: --CPAP qhs  T2DM: On metformin at home. Will do SSI for now while inpatient.  Paroxysmal atrial fibrillation: In SR on admission; reports compliance with xarelto. --continue xarelto  Diet: NPO for now,  if respiratory status improves and able to move to [place] can start HH/CM IVF: bolus for now VTE ppx: Xarelto Code: FULL - discussed on admission  Dispo: Admit patient to Inpatient with expected length of stay greater than 2 midnights.  Signed: Alphonzo Grieve, MD 06/15/2018, 11:00 AM

## 2018-06-16 ENCOUNTER — Inpatient Hospital Stay (HOSPITAL_COMMUNITY): Payer: Medicaid Other

## 2018-06-16 DIAGNOSIS — J189 Pneumonia, unspecified organism: Secondary | ICD-10-CM

## 2018-06-16 DIAGNOSIS — I2721 Secondary pulmonary arterial hypertension: Secondary | ICD-10-CM | POA: Diagnosis present

## 2018-06-16 DIAGNOSIS — K766 Portal hypertension: Secondary | ICD-10-CM | POA: Diagnosis present

## 2018-06-16 LAB — BASIC METABOLIC PANEL
Anion gap: 9 (ref 5–15)
BUN: 11 mg/dL (ref 6–20)
CO2: 25 mmol/L (ref 22–32)
Calcium: 8.6 mg/dL — ABNORMAL LOW (ref 8.9–10.3)
Chloride: 105 mmol/L (ref 98–111)
Creatinine, Ser: 0.86 mg/dL (ref 0.44–1.00)
GFR calc Af Amer: >60 mL/min (ref >60)
GFR calc non Af Amer: >60 mL/min (ref >60)
Glucose, Bld: 108 mg/dL — ABNORMAL HIGH (ref 70–99)
Potassium: 4.1 mmol/L (ref 3.5–5.1)
Sodium: 139 mmol/L (ref 135–145)

## 2018-06-16 LAB — GLUCOSE, CAPILLARY
Glucose-Capillary: 93 mg/dL (ref 70–99)
Glucose-Capillary: 116 mg/dL — ABNORMAL HIGH (ref 70–99)
Glucose-Capillary: 160 mg/dL — ABNORMAL HIGH (ref 70–99)
Glucose-Capillary: 100 mg/dL — ABNORMAL HIGH (ref 70–99)

## 2018-06-16 LAB — CBC
HCT: 48.4 % — ABNORMAL HIGH (ref 36.0–46.0)
Hemoglobin: 15.6 g/dL — ABNORMAL HIGH (ref 12.0–15.0)
MCH: 29 pg (ref 26.0–34.0)
MCHC: 32.2 g/dL (ref 30.0–36.0)
MCV: 90 fL (ref 80.0–100.0)
Platelets: 323 10*3/uL (ref 150–400)
RBC: 5.38 MIL/uL — ABNORMAL HIGH (ref 3.87–5.11)
RDW: 19.4 % — ABNORMAL HIGH (ref 11.5–15.5)
WBC: 30.1 10*3/uL — ABNORMAL HIGH (ref 4.0–10.5)
nRBC: 0 % (ref 0.0–0.2)

## 2018-06-16 LAB — MAGNESIUM: Magnesium: 1.9 mg/dL (ref 1.7–2.4)

## 2018-06-16 LAB — HIV ANTIBODY (ROUTINE TESTING W REFLEX): HIV Screen 4th Generation wRfx: NONREACTIVE

## 2018-06-16 LAB — PHOSPHORUS: Phosphorus: 3.6 mg/dL (ref 2.5–4.6)

## 2018-06-16 MED ORDER — TORSEMIDE 20 MG PO TABS
40.0000 mg | ORAL_TABLET | Freq: Every day | ORAL | Status: DC
  Administered 2018-06-16 – 2018-06-17 (×2): 40 mg via ORAL
  Filled 2018-06-16 (×2): qty 2

## 2018-06-16 MED ORDER — VANCOMYCIN HCL 10 G IV SOLR
1750.0000 mg | INTRAVENOUS | Status: DC
  Administered 2018-06-17: 1750 mg via INTRAVENOUS
  Filled 2018-06-16 (×2): qty 1750

## 2018-06-16 MED ORDER — TORSEMIDE 20 MG PO TABS: 40.0000 mg | ORAL_TABLET | Freq: Two times a day (BID) | ORAL | Status: DC

## 2018-06-16 MED ORDER — VANCOMYCIN HCL 10 G IV SOLR
2000.0000 mg | Freq: Once | INTRAVENOUS | Status: AC
  Administered 2018-06-16: 2000 mg via INTRAVENOUS
  Filled 2018-06-16: qty 2000

## 2018-06-16 NOTE — Progress Notes (Signed)
Subjective: HD#1   Overnight: Febrile to 101.5 F.  Today, [name] was examined at bedside with cardiology at bedside.  Initially her SPO2 was around 88% and we increased her supplemental oxygen.  She does report some improvement to her shortness of breath.  She denies cough, fevers, chills.  She complained about not having anything to eat for the past 2 days and requested that we resume her diet.  She denies abdominal pain, nausea, vomiting.  In regards to her lower extremity erythema, she reports that she has had for at least 10 years however there is new skin breaks visible at the shin.  Objective:  Vital signs in last 24 hours: Vitals:   06/15/18 2315 06/15/18 2324 06/16/18 0307 06/16/18 0343  BP:   (!) 109/58   Pulse:  100 (!) 104   Resp:  (!) 22 (!) 26   Temp: 99.5 F (37.5 C)  99.5 F (37.5 C)   TempSrc: Oral  Oral   SpO2:  97% 95%   Weight:    126.6 kg  Height:       Const: In no apparent distress, lying comfortably in bed HEENT: Venturi mask in place Resp: Right lower lobe crackles posteriorly CV: RRR, no murmurs, gallop, rub Abd: Distended secondary to body habitus Ext:       Assessment/Plan:  Principal Problem:   CAP (community acquired pneumonia) Active Problems:   Obstructive sleep apnea   Diabetes ([place])   Right heart failure, NYHA class 3 (HCC)   Pulmonary hypertension (HCC)   Chronic diastolic CHF (congestive heart failure) ([place])   Acute on chronic respiratory failure ([place])  Ms. Oleary is a 58 year old woman with medical history significant for multifactorial pulmonary arterial hypertension, chronic hypoxic respiratory failure on 4 L nasal cannula at home, chronic venous stasis, paroxysmal atrial fibrillation, type 2 diabetes mellitus, obstructive sleep apnea on CPAP, chronic diastolic heart failure who presented with productive cough and rigors found to have possible retrocardiac pulmonary infiltrate currently managing her for community-acquired  pneumonia and pulmonary hypertension.   #Acute on chronic hypoxic respiratory failure #Community-acquired pneumonia She spiked a fever of 101.5 F around midnight however has remained afebrile since.  Leukocytosis trended up with WBC 30 <<24.  On physical exams, she did have crackles at the right lower base which is quite unusual because her chest x-ray shows some infiltrate at the left base.  We did repeat chest x-ray PA and lateral which shows cardiomegaly, pulmonary vascular congestion, left> right basilar opacities.  Blood cultures have remained no growth less than 24 hours.  We will add vancomycin today given that she has had positive MRSA screens in the past. -Continue supplemental oxygen via NRB and wean as tolerated -Continue azithromycin and ceftriaxone -Add vancomycin -Follow blood culture, urine culture -Follow-up CBC  #Pulmonary hypertension, cor pulmonale #Chronic diastolic heart failure In regards to her oxygenation, she is still on the Venturi mask.  Her SPO2 was around 88% during our assessment this morning and we increased supplemental oxygen.  BP this a.m. 120s/60s.  She did have a mildly elevated JVP.  Her weight has remained stable at around 126 kg -Continue Remodulin infusion -Continue torsemide 40 mg daily -Hold sildenafil, spironolactone given soft BPs -Strict intake and output -Daily weight  #?Cellulitis: Though unusual for bilateral cellulitis, she does have lower extremity erythema without much warmness to touch.  There is new skin breaks at the shin.  We will treat with vancomycin given that her leukocytosis worsened overnight  and new fever. -Continue vancomycin  #Obstructive sleep apnea -CPAP nightly  #Type 2 diabetes mellitus -Continue SSI  #Paroxysmal atrial fibrillation In sinus rhythm. -Continue Xarelto  FEN: Replace electrolytes as needed, heart healthy/carb modified diet VTE prophylaxis: Xarelto CODE STATUS: Full code (per chart review, she would  not want long-term dependence on ventilator but does desire full scope of care)  Dispo: Anticipated discharge in approximately 1-2 day(s).   Jean Rosenthal, MD 06/16/2018, 6:37 AM Pager: [phone] Internal Medicine Teaching Service

## 2018-06-16 NOTE — Discharge Summary (Signed)
Name: [name] MRN: [id number] DOB: June 11, 1960 58 y.o. PCP: [name], MD  Date of Admission: 06/15/2018  6:58 AM Date of Discharge: 07/03/2018 Attending Physician: [name], *  Discharge Diagnosis: 1.  Acute on chronic hypoxic respiratory failure (pulmonary arterial hypertension with cor pulmonale, chronic diastolic heart failure, community-acquired pneumonia) 2.  Paroxysmal atrial fibrillation/flutter  Discharge Medications: Allergies as of 07/03/2018      Reactions   Aspirin    REACTION: airway swelling   Codeine    REACTION: tingling in lips and hard breathing - had reaction at dentist - states "I can't take certain kinds of codeine" - happened maybe 10 yr ago   Lisinopril    Cough   Sulfonamide Derivatives    REACTION: airway swelling   Latex Rash      Medication List    TAKE these medications   Accu-Chek Aviva Plus test strip Generic drug: glucose blood USE 1 STRIP TO CHECK GLUCOSE ONCE DAILY   acetaminophen 500 MG tablet Commonly known as: TYLENOL Take 1 tablet (500 mg total) by mouth every 6 (six) hours as needed.   Benadryl 25 mg capsule Generic drug: diphenhydrAMINE Take 25-75 mg by mouth 2 (two) times daily as needed for itching.   cetirizine 10 MG tablet Commonly known as: ZYRTEC Take 1 tablet (10 mg total) by mouth daily.   cyclobenzaprine 5 MG tablet Commonly known as: FLEXERIL Take 1 tablet (5 mg total) by mouth daily as needed for muscle spasms.   diclofenac sodium 1 % Gel Commonly known as: VOLTAREN Apply 2 g topically 4 (four) times daily.   ferrous sulfate 325 (65 FE) MG tablet Take 1 tablet (325 mg total) by mouth daily with breakfast.   fluticasone 50 MCG/ACT nasal spray Commonly known as: Flonase Place 1 spray into both nostrils daily. What changed:   when to take this  reasons to take this   gabapentin 300 MG capsule Commonly known as: NEURONTIN TAKE 2 CAPSULES BY MOUTH THREE TIMES DAILY    metFORMIN 500 MG tablet Commonly known as: GLUCOPHAGE TAKE ONE-HALF TABLET BY MOUTH WITH BREAKFAST What changed:   how much to take  how to take this  when to take this  additional instructions   mupirocin ointment 2 % Commonly known as: BACTROBAN Place 1 application into the nose 2 (two) times daily.   nystatin cream Commonly known as: MYCOSTATIN Apply 1 application topically 2 (two) times daily.   pantoprazole 20 MG tablet Commonly known as: PROTONIX Take 1 tablet (20 mg total) by mouth 2 (two) times daily.   potassium chloride SA 20 MEQ tablet Commonly known as: K-DUR Take 2 tablets (40 mEq total) by mouth 2 (two) times daily. What changed: See the new instructions.   pravastatin 20 MG tablet Commonly known as: PRAVACHOL TAKE 1 TABLET BY MOUTH ONCE DAILY   sildenafil 20 MG tablet Commonly known as: REVATIO Take 1 tablet (20 mg total) by mouth 3 (three) times daily.   sodium chloride 0.65 % Soln nasal spray Commonly known as: OCEAN Place 2 sprays into both nostrils as needed for congestion.   spironolactone 25 MG tablet Commonly known as: ALDACTONE Take 1/2 (one-half) tablet by mouth once daily What changed: See the new instructions.   torsemide 20 MG tablet Commonly known as: DEMADEX Take 4 tablets (80 mg total) by mouth 2 (two) times daily. What changed: how much to take   treprostinil 5 MG/ML Soln injection Commonly known as: REMODULIN Inject  6 ng/mL into the skin continuous.   vitamin B-12 500 MCG tablet Commonly known as: CYANOCOBALAMIN Take 500 mcg by mouth daily.   Xarelto 20 MG Tabs tablet Generic drug: rivaroxaban TAKE 1 TABLET BY MOUTH ONCE DAILY WITH SUPPER What changed: See the new instructions.       Disposition and follow-up:   [name] was discharged from [facility] in [place] condition.  At the hospital follow up visit please address:  1.  Acute on chronic hypoxic respiratory failure (pulmonary arterial  hypertension with cor pulmonale, chronic diastolic heart failure, community-acquired pneumonia)  Changes to medication: A) Remodulin infusion dose increased B) Torsemide increased to 80 mg BID  2.  Labs / imaging needed at time of follow-up: BMP  3.  Pending labs/ test needing follow-up: None  Follow-up Appointments:   Hospital Course by problem list: 1.  Acute on chronic hypoxic respiratory failure (pulmonary arterial hypertension with cor pulmonale, chronic diastolic heart failure, community-acquired pneumonia)  [name] is a 58 year old woman with medical history significant for pulmonary artery hypertension cor pulmonale, chronic hypoxic respiratory failure on 4 L nasal cannula at home, paroxysmal atrial fibrillation, chronic diastolic heart failure, chronic venous stasis, type 2 diabetes mellitus, obstructive sleep apnea on CPAP who presented to [facility] on June 15, 2018 with productive cough and rigors.  On arrival, she was febrile to 101.5, hypoxic to 86%, tachycardic to 126 with leukocytosis of 24.  Checks x-ray was concerning for possible retrocardiac opacity.  She completed a 7-day course of azithromycin and ceftriaxone.  In addition, we were concerned about MRSA pneumonia given her prior positive MRSA screening and and she received 3-day course of IV vancomycin (also to treat left lower extremity cellulitis).  Her blood cultures were unremarkable and showed no growth.  She was evaluated by cardiology and received aggressive diuresis with IV Lasix, torsemide, spironolactone.  She continued to have persistent supplemental oxygen requirement with high flow nasal cannula between 25 L and 30 L as she would desaturate each time we try to wean her off.  We also tried aggressive pulmonary toilet without much improvement.  She was evaluated by pulmonology who recommended aggressive pulmonary toilet with incentive spirometer, flutter valve, encourage ambulation and switching nasal  cannulas.  Her as needed inhalers were changed to scheduled and she received DuoNebs 3 times a day. She underwent RHC today which showed worsening pulmonary pressures about 10 mmHg higher than previous.  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  Her Remodulin infusion was increased given that she was found to have worsening pulmonary arterial hypertension.  She had an aggressive physical therapy session while admitted and was able to successfully wean from high flow supplemental oxygen to 4 to 6 L supplemental oxygen.  At discharge, she was advised to continue Remodulin infusion, spironolactone 25 mg daily, sildenafil and torsemide 80 mg twice a day.  She was also advised to follow-up with her primary care physician and cardiologist.  At discharge, her weight was stable at 118 kg.  Discharge Vitals:   BP 109/63   Pulse 89   Temp 98.5 F (36.9 C) (Oral)   Resp 19   Ht _0  (1.6 m)   Wt 118.6 kg   LMP 02/19/2011   SpO2 91%   BMI 46.32 kg/m   Pertinent Labs, Studies, and Procedures:  RIGHT HEART CATH Findings:  On O2 4L [place]  RA = 7 RV =  87/7 PA = 87/40 (54) PCW = 12 Fick cardiac output/index = 5.8/2.7 Thermo CO/CI = 7.4/3.4 PVR = 7.1 WU (Fick)  5.7 WU (Thermo) FA sat = 84% PA sat = 59%, 63%  Assessment: 1. Severe PAH  2. Normal left-sided pressures 3. Persistent hypoxemia  PORTABLE CHEST 1 VIEW  COMPARISON:  12/22/2017.  FINDINGS: The heart is enlarged. There is moderate vascular congestion. No frank pulmonary edema. Retrocardiac density could represent vascular crowding or possible infiltrate. Central catheter appears to terminate at the [facility] RA junction, similar to priors, although it is unclear where the proximal aspect of this catheter is located.  IMPRESSION: Cardiomegaly with moderate vascular congestion. Retrocardiac density could represent vascular crowding or possible infiltrate.  Follow-up with two view chest.   CLINICAL DATA:  Pneumonia.  Shortness of breath  EXAM: CHEST - 2 VIEW  COMPARISON:  One-view chest x-ray 06/15/2018  FINDINGS: The heart is enlarged. Right-sided PICC line is stable. Moderate pulmonary vascular congestion is present. Left greater than right basilar opacities are present.  IMPRESSION: 1. Similar appearance cardiomegaly with moderate pulmonary vascular congestion. 2. Left greater than right basilar opacities likely reflect Atelectasis.   PORTABLE CHEST 1 VIEW  COMPARISON:  Radiographs 06/16/2018 and 06/15/2018.  FINDINGS: 0628 hours. Stable cardiomegaly and chronic vascular congestion. The right IJ central venous catheter tip is unchanged near the superior cavoatrial junction. Left-greater-than-right bibasilar airspace opacities have slightly worsened compared with the recent prior studies. There may be a small left pleural effusion. No pneumothorax. The bones appear unchanged.  IMPRESSION: Interval slight worsening of bibasilar airspace opacities consistent with worsening pneumonia or superimposed atelectasis. Possible small left pleural effusion.   CT CHEST WITHOUT CONTRAST  TECHNIQUE: Multidetector CT imaging of the chest was performed following the standard protocol without IV contrast.  COMPARISON:  Chest radiograph 06/19/2018  FINDINGS: Cardiovascular: Right internal jugular approach central venous catheter terminates in the distal superior vena cava. The heart is enlarged. There is a pericardial effusion with maximum thickness of 9 mm at the base of the heart. Calcific atherosclerotic disease of the coronary arteries and aorta. Enlargement of the main pulmonary trunk, evidence of pulmonary arterial hypertension.  Mediastinum/Nodes: Numerous enlarged mediastinal lymph nodes with maximum short axis measurement of 18 mm. 2.1 cm soft tissue nodule within the isthmus of the thyroid gland.   Lungs/Pleura: The major airways are patent. Patchy areas of airspace consolidation with lower lobe dependent predominance. Additional ground-glass opacities throughout the upper lung fields.  Upper Abdomen: No acute abnormality.  Musculoskeletal: Findings of diffuse idiopathic skeletal hyperostosis of the thoracic spine.  IMPRESSION: 1. Confluent airspace consolidation in the dependent portions of the lower lobes may represent multifocal pneumonia or subsegmental atelectasis. Additional scattered ground-glass opacities throughout the lung parenchyma, right greater than left, may represent asymmetric pulmonary edema or infectious consolidation. 2. Enlarged heart with small pericardial effusion. 3. Enlarged main pulmonary trunk, suggestive of pulmonary arterial hypertension. 4. 9 mm soft tissue nodule within the isthmus of the thyroid gland. Further evaluation with focused ultrasound may be considered. 5. Calcific atherosclerotic disease of the coronary arteries.   Discharge Instructions: Discharge Instructions    Diet - low sodium heart healthy   Complete by: As directed    Discharge instructions   Complete by: As directed    [name],  It was a pleasure taking care of you at the hospital.  You were admitted because of difficulty breathing which was due to multiple medical diseases including your pulmonary artery hypertension, heart failure and  pneumonia.  We treated you with antibiotics, increased your Remodulin infusion and also gave you medications to take excess fluid from your lungs.  We had difficulty trying to wean you from the oxygen but we were finally able to find the right balance between 4 to 6 L.  I have placed a home health order for physical therapy to evaluate you once discharged.  I would like for you to follow-up with your primary care physician and also the cardiologist.  Regarding your medication, we increased your torsemide to 80 mg twice a day and also  added potassium.  Take care! Dr. Eileen Stanford   Increase activity slowly   Complete by: As directed       Signed: Jean Rosenthal, MD 07/03/2018, 11:13 AM   Pager: [phone] Internal Medicine Teaching Service

## 2018-06-16 NOTE — Progress Notes (Signed)
Advanced Heart Failure Rounding Note   Subjective:     Febrile to 101.5 overnight. Feels ok otherwise. Wearing CPAP this am.   Mild cough. Non-productive. BP stable on IV remodulin. Remains tachycardic   Objective:   Weight Range:  Vital Signs:   Temp:  [98 F (36.7 C)-101.5 F (38.6 C)] 98 F (36.7 C) (06/12 1130) Pulse Rate:  [88-125] 88 (06/12 1130) Resp:  [20-30] 20 (06/12 1130) BP: (93-125)/(48-75) 125/62 (06/12 1130) SpO2:  [88 %-97 %] 88 % (06/12 1130) FiO2 (%):  [55 %] 55 % (06/11 1235) Weight:  [126.3 kg-126.6 kg] 126.6 kg (06/12 0343) Last BM Date: 06/14/18  Weight change: Filed Weights   06/15/18 1235 06/16/18 0343  Weight: 126.3 kg 126.6 kg    Intake/Output:   Intake/Output Summary (Last 24 hours) at 06/16/2018 1234 Last data filed at 06/16/2018 4174 Gross per 24 hour  Intake 125 ml  Output 675 ml  Net -550 ml     Physical Exam: General:  Obese woman lying in bed on CPAP No resp difficulty HEENT: normal Neck: supple. JVP to ear . Carotids 2+ bilat; no bruits. No lymphadenopathy or thryomegaly appreciated. Cor: PMI nondisplaced. Regular rate & rhythm. 2/6 TR  R chest hickman ok  Lungs: crackles both bases R>L  Abdomen: obese soft, nontender, nondistended. No hepatosplenomegaly. No bruits or masses. Good bowel sounds. Extremities: no cyanosis, clubbing, rash, 1+ edema with chronic venous stasis changes marked erythema and calor on RLE Neuro: alert & orientedx3, cranial nerves grossly intact. moves all 4 extremities w/o difficulty. Affect pleasant  Telemetry: NSR 90-100 Personally reviewed   Labs: Basic Metabolic Panel: Recent Labs  Lab 06/15/18 0744 06/16/18 0743  NA 138 139  K 4.1 4.1  CL 103 105  CO2 24 25  GLUCOSE 118* 108*  BUN 9 11  CREATININE 0.80 0.86  CALCIUM 9.0 8.6*    Liver Function Tests: Recent Labs  Lab 06/15/18 0744  AST 42*  ALT 24  ALKPHOS 76  BILITOT 0.7  PROT 7.1  ALBUMIN 3.7   No results for input(s):  LIPASE, AMYLASE in the last 168 hours. No results for input(s): AMMONIA in the last 168 hours.  CBC: Recent Labs  Lab 06/15/18 0744 06/16/18 0743  WBC 24.8* 30.1*  NEUTROABS 22.4*  --   HGB 16.3* 15.6*  HCT 50.6* 48.4*  MCV 91.7 90.0  PLT 388 323    Cardiac Enzymes: No results for input(s): CKTOTAL, CKMB, CKMBINDEX, TROPONINI in the last 168 hours.  BNP: BNP (last 3 results) Recent Labs    12/22/17 1247 06/15/18 0744  BNP 47.4 49.0    ProBNP (last 3 results) No results for input(s): PROBNP in the last 8760 hours.    Other results:  Imaging: Dg Chest 2 View  Result Date: 06/16/2018 CLINICAL DATA:  Pneumonia.  Shortness of breath EXAM: CHEST - 2 VIEW COMPARISON:  One-view chest x-ray 06/15/2018 FINDINGS: The heart is enlarged. Right-sided PICC line is stable. Moderate pulmonary vascular congestion is present. Left greater than right basilar opacities are present. IMPRESSION: 1. Similar appearance cardiomegaly with moderate pulmonary vascular congestion. 2. Left greater than right basilar opacities likely reflect atelectasis. Electronically Signed   By: San Morelle M.D.   On: 06/16/2018 08:40   Dg Chest Port 1 View  Result Date: 06/15/2018 CLINICAL DATA:  SOB. EXAM: PORTABLE CHEST 1 VIEW COMPARISON:  12/22/2017. FINDINGS: The heart is enlarged. There is moderate vascular congestion. No frank pulmonary edema. Retrocardiac density could represent  vascular crowding or possible infiltrate. Central catheter appears to terminate at the [facility] RA junction, similar to priors, although it is unclear where the proximal aspect of this catheter is located. IMPRESSION: Cardiomegaly with moderate vascular congestion. Retrocardiac density could represent vascular crowding or possible infiltrate. Follow-up with two view chest. Electronically Signed   By: Staci Righter M.D.   On: 06/15/2018 08:09      Medications:     Scheduled Medications: . gabapentin  600 mg Oral TID  .  insulin aspart  0-9 Units Subcutaneous TID WC  . pantoprazole  20 mg Oral BID  . pravastatin  20 mg Oral Daily  . rivaroxaban  20 mg Oral Q supper  . torsemide  40 mg Oral Daily     Infusions: . azithromycin 250 mg (06/16/18 0952)  . cefTRIAXone (ROCEPHIN)  IV 1 g (06/16/18 0855)  . treprostinil (REMODULIN) infusion 77 ng/kg/min (06/15/18 1145)  . [START ON 06/17/2018] vancomycin    . vancomycin       PRN Medications:  acetaminophen **OR** acetaminophen, fluticasone, ipratropium-albuterol, ondansetron **OR** ondansetron (ZOFRAN) IV   Assessment/Plan:   1. Acute on chronic hypoxic respiratory failure due to possible CAP - On admit CXR concerning for possible retrocardiac infiltrate. WBC 24k. Lactate up  - Blood Cultures NGTD - On azithromycin and ceftriaxone.    2. Possible RLE cellulitis - Add vancomycin  3. Acute Chronic diastolic HF/cor pulmonale:  - Hold torsemide and spiro for now with concern for shock.  - Volume status elevated. BP stable. Restart home torsemide  3. 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 However PAH far out of proportion to L-sided pressures or restrictive lung disease. - Follows with [facility]. Last seen 10/2017 - Remains on Remodulin. Cannot stop remodulin due to rebound pulmonary hypertensin.  - Hold sildenafil for now. Will need to restart prior to discharge.   4. Paroxysmal atrial flutter.  -In Sinus - Continue Xarelto.Denies bleeding.   Length of Stay: 1   Glori Bickers MD 06/16/2018, 12:34 PM  Advanced Heart Failure Team Pager [phone] (M-F; [place])  Please contact [facility] for night-coverage after hours (4p -7a ) and weekends on [email or web address]

## 2018-06-16 NOTE — Progress Notes (Signed)
Pharmacy Antibiotic Note  [name] is a 58 y.o. female admitted on 06/15/2018 with pneumonia and cellulitis.  Pharmacy has been consulted for vancomycin dosing.  WBC 24.8>30.1, LA 2.6>2.1, PCT 0.17. Scr 0.86 (CrCl 93 mL/min). CXR showing possible infiltrate- on azithromycin + ceftriaxone. Additionally, presenting with bilateral lower extremity cellulitis.   Plan: Vancomycin 2 g IV once then 1750 mg IV every 24 hours  Monitor renal fx, cx results, clinical pic, and vanc levels as appropriate  Height: _0  (160 cm) Weight: 279 lb 1.6 oz (126.6 kg) IBW/kg (Calculated) : 52.4  Temp (24hrs), Avg:99.4 F (37.4 C), Min:98.2 F (36.8 C), Max:101.5 F (38.6 C)  Recent Labs  Lab 06/15/18 0744 06/15/18 0746 06/15/18 0951 06/16/18 0743  WBC 24.8*  --   --  30.1*  CREATININE 0.80  --   --  0.86  LATICACIDVEN  --  2.6* 2.1*  --     Estimated Creatinine Clearance: 93.5 mL/min (by C-G formula based on SCr of 0.86 mg/dL).    Allergies  Allergen Reactions  . Aspirin     REACTION: airway swelling  . Codeine     REACTION: tingling in lips and hard breathing - had reaction at dentist - states "I can't take certain kinds of codeine" - happened maybe 10 yr ago  . Lisinopril     Cough  . Sulfonamide Derivatives     REACTION: airway swelling  . Latex Rash    Antimicrobials this admission: Vanc 6/12 >>  Ceftriaxone 6/11 >> Azithromycin 6/11 >>   Dose adjustments this admission: N/A  Microbiology results: 6/11 BCx: NGTD 6/11 COVID: neg   6/11 MRSA PCR: ordered  Thank you for allowing pharmacy to be a part of this patient's care.  Antonietta Jewel, PharmD, [place] Clinical Pharmacist  Pager: [phone] Phone: [phone] 06/16/2018 10:07 AM

## 2018-06-16 NOTE — Progress Notes (Addendum)
PHARMACY - PHYSICIAN COMMUNICATION CRITICAL VALUE ALERT - BLOOD CULTURE IDENTIFICATION (BCID)  [name] is an 58 y.o. female who presented to [facility] on 06/15/2018 with a chief complaint of productive cough.  Assessment:  1 of 3 blood cultures growing gram positive rods (likely contaminant).   Name of physician (or Provider) Contacted: Internal Medicine Teaching Service  Current antibiotics: Rocephin + Azithromycin + vancomycin  Changes to prescribed antibiotics recommended:  none  No results found for this or any previous visit.   Arrie Senate, PharmD, BCPS Clinical Pharmacist [phone] Please check AMION for all [place] numbers 06/16/2018

## 2018-06-17 LAB — BASIC METABOLIC PANEL
Anion gap: 10 (ref 5–15)
BUN: 11 mg/dL (ref 6–20)
CO2: 28 mmol/L (ref 22–32)
Calcium: 8.2 mg/dL — ABNORMAL LOW (ref 8.9–10.3)
Chloride: 100 mmol/L (ref 98–111)
Creatinine, Ser: 0.78 mg/dL (ref 0.44–1.00)
GFR calc Af Amer: >60 mL/min (ref >60)
GFR calc non Af Amer: >60 mL/min (ref >60)
Glucose, Bld: 118 mg/dL — ABNORMAL HIGH (ref 70–99)
Potassium: 3.6 mmol/L (ref 3.5–5.1)
Sodium: 138 mmol/L (ref 135–145)

## 2018-06-17 LAB — GLUCOSE, CAPILLARY
Glucose-Capillary: 111 mg/dL — ABNORMAL HIGH (ref 70–99)
Glucose-Capillary: 163 mg/dL — ABNORMAL HIGH (ref 70–99)
Glucose-Capillary: 109 mg/dL — ABNORMAL HIGH (ref 70–99)
Glucose-Capillary: 93 mg/dL (ref 70–99)

## 2018-06-17 LAB — CBC
HCT: 47.6 % — ABNORMAL HIGH (ref 36.0–46.0)
Hemoglobin: 15.3 g/dL — ABNORMAL HIGH (ref 12.0–15.0)
MCH: 29.2 pg (ref 26.0–34.0)
MCHC: 32.1 g/dL (ref 30.0–36.0)
MCV: 90.8 fL (ref 80.0–100.0)
Platelets: 320 10*3/uL (ref 150–400)
RBC: 5.24 MIL/uL — ABNORMAL HIGH (ref 3.87–5.11)
RDW: 19.1 % — ABNORMAL HIGH (ref 11.5–15.5)
WBC: 18.2 10*3/uL — ABNORMAL HIGH (ref 4.0–10.5)
nRBC: 0 % (ref 0.0–0.2)

## 2018-06-17 MED ORDER — IBUPROFEN 200 MG PO TABS
200.0000 mg | ORAL_TABLET | Freq: Once | ORAL | Status: AC | PRN
  Administered 2018-06-17: 400 mg via ORAL
  Filled 2018-06-17: qty 2

## 2018-06-17 NOTE — Progress Notes (Signed)
Subjective: HD#2    Overnight: Remained afebrile.  Noted to have several episodes of desaturation to 88-90%.  Today, [name] was examined at bedside while comfortably lying in bed.  She endorsed tension type headaches which she typically has but has been more frequent in the hospital.  It is somewhat relieved with Tylenol and application of cold towels but does not take the edge off.  She also experienced pain in her eyes bilaterally without lacrimation, blurry vision or dizziness.  Objective:  Vital signs in last 24 hours: Vitals:   06/16/18 2247 06/16/18 2300 06/17/18 0000 06/17/18 0300  BP: 116/75 106/66 121/70 124/70  Pulse: 92 98 98 (!) 103  Resp: (!) 27 (!) 21 20 (!) 28  Temp:  99.3 F (37.4 C) 99.1 F (37.3 C) 99.4 F (37.4 C)  TempSrc:  Oral Oral Oral  SpO2: 98% 95% 95% 94%  Weight:    124.9 kg  Height:       Const: In no apparent distress, lying comfortably in bed, conversational HEENT: Atraumatic, normocephalic, JVD difficult to assess due to body habitus Resp: Bibasilar crackles, otherwise no wheezing, SPO2 95% on CPAP CV: RRR, no murmurs, gallop, rub Abd: Bowel sounds present, distended secondary to body habitus, nontender to palpation   Assessment/Plan:  Principal Problem:   CAP (community acquired pneumonia) Active Problems:   Obstructive sleep apnea   Diabetes ([place])   Right heart failure, NYHA class 3 (HCC)   Pulmonary hypertension (HCC)   Chronic diastolic CHF (congestive heart failure) (HCC)   Acute on chronic respiratory failure (HCC)   PAH (pulmonary artery hypertension) ([place])  Ms. Heider is a 58 year old woman with medical history significant for multifactorial pulmonary arterial hypertension, chronic hypoxic respiratory failure on 4 L nasal cannula at home, chronic venous stasis, paroxysmal atrial fibrillation, type 2 diabetes mellitus, obstructive sleep apnea on CPAP, chronic diastolic heart failure who presented with productive cough and  rigors found to have possible retrocardiac pulmonary infiltrate currently managing her for community-acquired pneumonia and pulmonary hypertension.   #Acute on chronic hypoxic respiratory failure #Community-acquired pneumonia She remained afebrile overnight.  She was noted to have 2 episodes of desaturation to 88-89% on the nonrebreather mask however she tolerated CPAP overnight and maintain oxygen saturation in the 90s.  On physical exam today, she was noted to have bibasilar crackles.  In addition, her leukocytosis has improved to 18 today from 30 yesterday.  1/3 tubes of blood cultures now growing gram positive rods.  -Continue supplemental oxygen via NRB and wean as tolerated -Continue azithromycin (Day 3), ceftriaxone (Day 3), Vancomycin (Day 2) (Plan to narrow pending blood cx result) -Follow blood culture, urine culture -Follow-up CBC  #Pulmonary hypertension, cor pulmonale #Chronic diastolic heart failure She was placed on CPAP overnight and tolerated it well. Oxygen saturations remained in the 90s. Weight is down about 1kg since admission. UOP of 2L. Physical exams noticeable for bibasilar rales. We are carefully slowly resuming her home diuretics.  -Continue Remodulin infusion -Continue torsemide 40 mg daily -Hold sildenafil, spironolactone given soft BPs -Strict intake and output -Daily weight  #RLE Cellulitis: RLE erythema unchanged from yesterday. Non tender to palpation and without warmth. Leukocytosis improving 18<30 -Continue vancomycin  #Obstructive sleep apnea -CPAP nightly  #Type 2 diabetes mellitus -Continue SSI  #Paroxysmal atrial fibrillation/Flutter  In sinus rhythm. -Continue Xarelto  FEN: Replace electrolytes as needed, heart healthy/carb modified diet VTE prophylaxis: Xarelto CODE STATUS: Full code (per chart review, she would not want long-term dependence  on ventilator but does desire full scope of care)  Dispo: Anticipated discharge in  approximately 1-2 day(s).   Jean Rosenthal, MD 06/17/2018, 6:26 AM Pager: [phone] Internal Medicine Teaching Service

## 2018-06-17 NOTE — Progress Notes (Signed)
Remodulin Cartridge changed by patient with own materials and this nurse's witness.

## 2018-06-18 DIAGNOSIS — J181 Lobar pneumonia, unspecified organism: Secondary | ICD-10-CM

## 2018-06-18 LAB — BASIC METABOLIC PANEL
Anion gap: 12 (ref 5–15)
BUN: 12 mg/dL (ref 6–20)
CO2: 32 mmol/L (ref 22–32)
Calcium: 8.2 mg/dL — ABNORMAL LOW (ref 8.9–10.3)
Chloride: 94 mmol/L — ABNORMAL LOW (ref 98–111)
Creatinine, Ser: 0.73 mg/dL (ref 0.44–1.00)
GFR calc Af Amer: >60 mL/min (ref >60)
GFR calc non Af Amer: >60 mL/min (ref >60)
Glucose, Bld: 92 mg/dL (ref 70–99)
Potassium: 3.1 mmol/L — ABNORMAL LOW (ref 3.5–5.1)
Sodium: 138 mmol/L (ref 135–145)

## 2018-06-18 LAB — CULTURE, BLOOD (ROUTINE X 2): Special Requests: ADEQUATE

## 2018-06-18 LAB — CBC
HCT: 46.4 % — ABNORMAL HIGH (ref 36.0–46.0)
Hemoglobin: 15 g/dL (ref 12.0–15.0)
MCH: 29.2 pg (ref 26.0–34.0)
MCHC: 32.3 g/dL (ref 30.0–36.0)
MCV: 90.4 fL (ref 80.0–100.0)
Platelets: 302 10*3/uL (ref 150–400)
RBC: 5.13 MIL/uL — ABNORMAL HIGH (ref 3.87–5.11)
RDW: 18.3 % — ABNORMAL HIGH (ref 11.5–15.5)
WBC: 11.3 10*3/uL — ABNORMAL HIGH (ref 4.0–10.5)
nRBC: 0 % (ref 0.0–0.2)

## 2018-06-18 MED ORDER — POTASSIUM CHLORIDE CRYS ER 20 MEQ PO TBCR
40.0000 meq | EXTENDED_RELEASE_TABLET | Freq: Two times a day (BID) | ORAL | Status: AC
  Administered 2018-06-18 (×2): 40 meq via ORAL
  Filled 2018-06-18 (×2): qty 2

## 2018-06-18 MED ORDER — SODIUM CHLORIDE 0.9 % IV SOLN
INTRAVENOUS | Status: DC | PRN
  Administered 2018-06-18: 250 mL via INTRAVENOUS

## 2018-06-18 MED ORDER — FUROSEMIDE 10 MG/ML IJ SOLN
80.0000 mg | Freq: Once | INTRAMUSCULAR | Status: AC
  Administered 2018-06-18: 80 mg via INTRAVENOUS
  Filled 2018-06-18 (×2): qty 8

## 2018-06-18 MED ORDER — TORSEMIDE 20 MG PO TABS
40.0000 mg | ORAL_TABLET | Freq: Two times a day (BID) | ORAL | Status: DC
  Administered 2018-06-18 – 2018-06-20 (×4): 40 mg via ORAL
  Filled 2018-06-18 (×4): qty 2

## 2018-06-18 MED ORDER — SPIRONOLACTONE 12.5 MG HALF TABLET
12.5000 mg | ORAL_TABLET | Freq: Every day | ORAL | Status: DC
  Administered 2018-06-18 – 2018-06-20 (×3): 12.5 mg via ORAL
  Filled 2018-06-18 (×3): qty 1

## 2018-06-18 MED ORDER — POTASSIUM CHLORIDE CRYS ER 20 MEQ PO TBCR
40.0000 meq | EXTENDED_RELEASE_TABLET | Freq: Once | ORAL | Status: AC
  Administered 2018-06-18: 40 meq via ORAL
  Filled 2018-06-18: qty 2

## 2018-06-18 NOTE — Progress Notes (Addendum)
   Subjective: HD#3   Overnight: No acute overnight events reported.  Today, [name] was examined at bedside and she reports of much improvement to dyspnea since admission.  She was sitting on the bed and eating breakfast.  She reiterated that she is not able to lie flat in bed to sleep and usually uses 3 pillows.  Objective:  Vital signs in last 24 hours: Vitals:   06/17/18 2106 06/17/18 2143 06/17/18 2300 06/18/18 0300  BP:   121/66 140/78  Pulse: 90 87 84 83  Resp: (!) 22 (!) 30 (!) 21 (!) 24  Temp:   98.6 F (37 C) 97.7 F (36.5 C)  TempSrc:   Oral Axillary  SpO2: 91% 91% 95% 95%  Weight:    125.1 kg  Height:       Const: In no apparent distress, lying comfortably in bed, conversational HEENT: Atraumatic, normocephalic Resp: Right posterior lobe crackles (persistent)-unchanged, no wheezing CV: RRR, no murmurs, gallop, rub Ext: Stasis dermatitis   Assessment/Plan:  Principal Problem:   CAP (community acquired pneumonia) Active Problems:   Obstructive sleep apnea   Diabetes (HCC)   Right heart failure, NYHA class 3 (HCC)   Pulmonary hypertension (HCC)   Chronic diastolic CHF (congestive heart failure) (HCC)   Acute on chronic respiratory failure (HCC)   PAH (pulmonary artery hypertension) ([place])  Ms. Lavergne is a 58 year old woman with medical history significant for multifactorial pulmonary arterial hypertension, chronic hypoxic respiratory failure on 4 L nasal cannula at home, chronic venous stasis, paroxysmal atrial fibrillation, type 2 diabetes mellitus, obstructive sleep apnea on CPAP, chronic diastolic heart failure who presented with productive cough and rigors found to have possible retrocardiac pulmonary infiltrate currently managing her for community-acquired pneumoniaand pulmonary hypertension.  #Acute on chronic hypoxic respiratory failure #Community-acquired pneumonia Subjectively, she reports of feeling much better since admission.  She has  remained afebrile however still requiring high flow nasal cannula and current oxygen saturation of about 88-90%.  Her blood culture is growing corynebacterium which seems to be a contaminant from skin flora.  Plan is to discontinue vancomycin today.  Otherwise, other blood cultures have remained no growth to date. -Continue supplemental oxygen via NRB and wean as tolerated -Continue azithromycin (Day 4), ceftriaxone (Day 4), Vancomycin (Day 3/3)  -Follow-up CBC  #Pulmonary hypertension, cor pulmonale #Chronic diastolic heart failure She is diuresing very well.  Urine output up over the past 24 hours of 3 L.  However, she still requiring high flow nasal cannula which is unusual from her baseline 4 L supplemental oxygen at home.  Plan is to continue home torsemide 40 mg twice daily and spironolactone.  Her weight  is stable around 125 kg however overall down about 1 kg since admission.  She does have persistent crackles at the right base posteriorly which is most likely sequelae of cor pulmonale. -Continue Remodulin infusion -Continue torsemide 40 mg BID, spironolactone 12.5 mg daily -Hold sildenafil -Strict intake and output -Daily weight  #RLE Cellulitis vs stasis dermatitis -Discontinue vancomycin  #Obstructive sleep apnea -CPAP nightly  #Type 2 diabetes mellitus -Continue SSI  #Paroxysmal atrial fibrillation/Flutter  In sinus rhythm. -Continue Xarelto  FEN: Replace electrolytes as needed,heart healthy/carb modified diet VTE prophylaxis: Xarelto CODE STATUS: Full code(per chart review, she would not want long-term dependence on ventilator but doesdesirefull scope of care)  Dispo: Anticipated discharge in approximately1-2day(s).   Jean Rosenthal, MD 06/18/2018, 6:08 AM Pager: [phone] Internal Medicine Teaching Service

## 2018-06-18 NOTE — Progress Notes (Signed)
Noted right chest central line, approximately 5in exposed cathter with Remodulin pump attached.  Pt states that this is the normal amount of catheter exposure.  Her pump is running, tubing intact.  Pt states bag was changed early this AM.  She has additional supplies at [facility].

## 2018-06-18 NOTE — Progress Notes (Signed)
VAST RN consulted to place new PIV.  Assessed current PIV in RAC; line is positional. Educated patient how to utilize black pressure line on IV pump to reposition arm to prevent pump from stopping. Advised unit RN to place another consult if she continues to have issues with pump beeping occlusion.

## 2018-06-18 NOTE — Plan of Care (Signed)
  Problem: Clinical Measurements: Goal: Respiratory complications will improve Outcome: Progressing   Problem: Nutrition: Goal: Adequate nutrition will be maintained Outcome: Progressing   Problem: Elimination: Goal: Will not experience complications related to bowel motility Outcome: Progressing Goal: Will not experience complications related to urinary retention Outcome: Progressing

## 2018-06-18 NOTE — Progress Notes (Signed)
Advanced Heart Failure Rounding Note   Subjective:    On broad spectrum abx.  Now afebrile.   Good urine output overnight on po torsemide but weight stable. Still SOB. On high flow [place] at 15 with desats.   Denies, CP, orthopnea or PND  BCx 1/2 CNS (contaminant)    Objective:   Weight Range:  Vital Signs:   Temp:  [97.7 F (36.5 C)-98.7 F (37.1 C)] 97.9 F (36.6 C) (06/14 0732) Pulse Rate:  [80-91] 84 (06/14 0924) Resp:  [20-30] 26 (06/14 0924) BP: (113-147)/(66-87) 120/66 (06/14 0824) SpO2:  [88 %-95 %] 88 % (06/14 0924) Weight:  [125.1 kg] 125.1 kg (06/14 0300) Last BM Date: 06/17/18  Weight change: Filed Weights   06/16/18 0343 06/17/18 0300 06/18/18 0300  Weight: 126.6 kg 124.9 kg 125.1 kg    Intake/Output:   Intake/Output Summary (Last 24 hours) at 06/18/2018 1121 Last data filed at 06/18/2018 0848 Gross per 24 hour  Intake 412.72 ml  Output 3500 ml  Net -3087.28 ml     Physical Exam: General:  Lying in bed .On 15L [place] HEENT: normal Neck: supple. JVP jaw Carotids 2+ bilat; no bruits. No lymphadenopathy or thryomegaly appreciated. Cor: PMI nondisplaced. Regular rate & rhythm. No rubs, gallops or murmurs. Right upper chest [place] Lungs: coarse. No wheeze Abdomen: obese soft, nontender, nondistended. No hepatosplenomegaly. No bruits or masses. Good bowel sounds. Extremities: no cyanosis, clubbing, rash, 1+ edema bilateral LE erythema R>>L (RLE erythema much improved) Neuro: alert & orientedx3, cranial nerves grossly intact. moves all 4 extremities w/o difficulty. Affect pleasant   Telemetry: NSR 80-90 Personally reviewed   Labs: Basic Metabolic Panel: Recent Labs  Lab 06/15/18 0744 06/16/18 0743 06/17/18 0643 06/18/18 0618  NA 138 139 138 138  K 4.1 4.1 3.6 3.1*  CL 103 105 100 94*  CO2 _0 32  GLUCOSE 118* 108* 118* 92  BUN _1 CREATININE 0.80 0.86 0.78 0.73  CALCIUM 9.0 8.6* 8.2* 8.2*  MG  --  1.9  --   --   PHOS  --  3.6   --   --     Liver Function Tests: Recent Labs  Lab 06/15/18 0744  AST 42*  ALT 24  ALKPHOS 76  BILITOT 0.7  PROT 7.1  ALBUMIN 3.7   No results for input(s): LIPASE, AMYLASE in the last 168 hours. No results for input(s): AMMONIA in the last 168 hours.  CBC: Recent Labs  Lab 06/15/18 0744 06/16/18 0743 06/17/18 0643  WBC 24.8* 30.1* 18.2*  NEUTROABS 22.4*  --   --   HGB 16.3* 15.6* 15.3*  HCT 50.6* 48.4* 47.6*  MCV 91.7 90.0 90.8  PLT 388 323 320    Cardiac Enzymes: No results for input(s): CKTOTAL, CKMB, CKMBINDEX, TROPONINI in the last 168 hours.  BNP: BNP (last 3 results) Recent Labs    12/22/17 1247 06/15/18 0744  BNP 47.4 49.0    ProBNP (last 3 results) No results for input(s): PROBNP in the last 8760 hours.    Other results:  Imaging: No results found.   Medications:     Scheduled Medications: . gabapentin  600 mg Oral TID  . insulin aspart  0-9 Units Subcutaneous TID WC  . pantoprazole  20 mg Oral BID  . potassium chloride  40 mEq Oral BID  . pravastatin  20 mg Oral Daily  . rivaroxaban  20 mg Oral Q supper  . spironolactone  12.5 mg  Oral Daily  . torsemide  40 mg Oral BID    Infusions: . sodium chloride 250 mL (06/18/18 0916)  . azithromycin 250 mg (06/18/18 0919)  . cefTRIAXone (ROCEPHIN)  IV 1 g (06/17/18 1047)  . treprostinil (REMODULIN) infusion 77 ng/kg/min (06/15/18 1145)    PRN Medications: sodium chloride, acetaminophen **OR** acetaminophen, fluticasone, ipratropium-albuterol, ondansetron **OR** ondansetron (ZOFRAN) IV   Assessment/Plan:   1. Acute on chronic hypoxic respiratory failure due to possible CAP - On admit CXR concerning for possible retrocardiac infiltrate. WBC 24k. Lactate up  - Blood Cultures 1/2 CNS. Likely contaminant  - On azithromycin and ceftriaxone (day 4/7). Finished 3 days vanc today   -  with need for increased O2 with give additional lasix 80 IV x 1  - repeat CXR in am   2. Possible RLE  cellulitis -  Received vanc x 3 days per IMTS  3. Acute Chronic diastolic HF/cor pulmonale:  - Diuresing well with tosemide but weight unchanged.  - She is drinking a lot of fluids - will restrict to 1800cc - Continue home torsemide - with need for increased O2 with give additional lasix 80 IV x 1   3. 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 However PAH far out of proportion to L-sided pressures or restrictive lung disease. - Follows with [facility]. Last seen 10/2017 - Remains on Remodulin. Cannot stop remodulin due to rebound pulmonary hypertensin.  - Likely restart sildenafil tomorrow  4. Paroxysmal atrial flutter.  -In Sinus - Continue Xarelto.No bleeding   5. Hypokalemia - K 3.1. will supp   Length of Stay: 3   Acelynn Dejonge MD 06/18/2018, 11:21 AM  Advanced Heart Failure Team Pager [phone] (M-F; 7a - 4p)  Please contact [facility] for night-coverage after hours (4p -7a ) and weekends on [email or web address]

## 2018-06-19 ENCOUNTER — Inpatient Hospital Stay (HOSPITAL_COMMUNITY): Payer: Medicaid Other

## 2018-06-19 DIAGNOSIS — E1151 Type 2 diabetes mellitus with diabetic peripheral angiopathy without gangrene: Secondary | ICD-10-CM

## 2018-06-19 LAB — GLUCOSE, CAPILLARY
Glucose-Capillary: 100 mg/dL — ABNORMAL HIGH (ref 70–99)
Glucose-Capillary: 145 mg/dL — ABNORMAL HIGH (ref 70–99)
Glucose-Capillary: 124 mg/dL — ABNORMAL HIGH (ref 70–99)
Glucose-Capillary: 129 mg/dL — ABNORMAL HIGH (ref 70–99)
Glucose-Capillary: 107 mg/dL — ABNORMAL HIGH (ref 70–99)
Glucose-Capillary: 131 mg/dL — ABNORMAL HIGH (ref 70–99)
Glucose-Capillary: 83 mg/dL (ref 70–99)
Glucose-Capillary: 86 mg/dL (ref 70–99)

## 2018-06-19 LAB — BASIC METABOLIC PANEL
Anion gap: 10 (ref 5–15)
BUN: 11 mg/dL (ref 6–20)
CO2: 35 mmol/L — ABNORMAL HIGH (ref 22–32)
Calcium: 8.2 mg/dL — ABNORMAL LOW (ref 8.9–10.3)
Chloride: 95 mmol/L — ABNORMAL LOW (ref 98–111)
Creatinine, Ser: 0.65 mg/dL (ref 0.44–1.00)
GFR calc Af Amer: >60 mL/min (ref >60)
GFR calc non Af Amer: >60 mL/min (ref >60)
Glucose, Bld: 113 mg/dL — ABNORMAL HIGH (ref 70–99)
Potassium: 4.2 mmol/L (ref 3.5–5.1)
Sodium: 140 mmol/L (ref 135–145)

## 2018-06-19 LAB — MAGNESIUM: Magnesium: 1.7 mg/dL (ref 1.7–2.4)

## 2018-06-19 MED ORDER — SILDENAFIL CITRATE 20 MG PO TABS
20.0000 mg | ORAL_TABLET | Freq: Three times a day (TID) | ORAL | Status: DC
  Administered 2018-06-19 – 2018-07-03 (×43): 20 mg via ORAL
  Filled 2018-06-19 (×46): qty 1

## 2018-06-19 MED ORDER — MAGNESIUM SULFATE 2 GM/50ML IV SOLN
2.0000 g | Freq: Once | INTRAVENOUS | Status: AC
  Administered 2018-06-19: 2 g via INTRAVENOUS
  Filled 2018-06-19: qty 50

## 2018-06-19 NOTE — Progress Notes (Signed)
RT placed patient on CPAP HS auto. 12L O2 bleed in needed. Patient tolerating well at this time.

## 2018-06-19 NOTE — Progress Notes (Signed)
RT instructed patient on the use of flutter valve. Patient able to demonstrate back good technique with a productive cough after use.

## 2018-06-19 NOTE — Progress Notes (Signed)
Subjective: HD#4   Overnight:No acute events reported   Today, [name] was examined and evaluated at bedside this AM. She was observed resting comfortably in bed. She mentions overnight she had difficulty using the CPAP and had poor night of sleep. Currently endorsing headache which she states is chronic and often comes and goes. Some mild improvement with Tylenol. She hopes to have better pain improvement with holding her 'head in the right position.' She mentions improvement with her breathing. She can feel 'something wanting to come out.' but currently endorsing dry, non-productive cough. She states drinking hot water helps in breaking up the phlegm. She mentions some lower extremity pain feeling like 'needles' which she states is chronic and is exacerbated by the poor quality of hospital bed mattress and pillows.  Objective:  Vital signs in last 24 hours: Vitals:   06/19/18 0000 06/19/18 0025 06/19/18 0300 06/19/18 0405  BP: 118/76  125/72   Pulse: 93 91 92 91  Resp: (!) 28 (!) _0 Temp:   97.8 F (36.6 C)   TempSrc:   Axillary   SpO2: 92% 94% 92% 90%  Weight:   122.2 kg   Height:       Const: In no apparent distress, lying comfortably in bed, conversational HEENT: Atraumatic, normocephalic Resp: Bibasilar crakles R>L CV: RRR, no murmurs, gallop, rub Abd: Bowel sounds present, nondistended, nontender to palpation Ext: RLE erythema improved.   Assessment/Plan:  Principal Problem:   CAP (community acquired pneumonia) Active Problems:   Obstructive sleep apnea   Diabetes ([place])   Right heart failure, NYHA class 3 (HCC)   Pulmonary hypertension (HCC)   Chronic diastolic CHF (congestive heart failure) (HCC)   Acute on chronic respiratory failure (HCC)   PAH (pulmonary artery hypertension) ([place])  [name] is a 58 year old woman with medical history significant for multifactorial pulmonary arterial hypertension, chronic hypoxic respiratory failure on 4 L nasal  cannula at home, chronic venous stasis, paroxysmal atrial fibrillation, type 2 diabetes mellitus, obstructive sleep apnea on CPAP, chronic diastolic heart failure who presented with productive cough and rigors found to have possible retrocardiac pulmonary infiltrate currently managing her for community-acquired pneumoniaand pulmonary hypertension.  #Acute on chronic hypoxic respiratory failure #Community-acquired pneumonia Clinically she has improved however continues to require high flow nasal cannula with 100% FiO2.  This morning, SPO2 was within 88-90%.  On physical exams, she has bibasilar crackles however right side is worse than the left.  Repeat chest x-ray shows bilateral airspace disease which I believe is likely atelectasis. -Continue supplemental oxygen via NRB and wean as tolerated -Continue azithromycin(Day 5),ceftriaxone(Day 5), Vancomycin(Day 3/3)  -Continue pulmonary toilet, incentive spirometry, inhalers -Follow-up CBC  #Pulmonary hypertension, cor pulmonale #Chronic diastolic heart failure She still requiring supplemental oxygen via high flow nasal cannula.  She has been diuresing very well.  Her weight is down about 7 kg since last recorded in February.  Her lack of improvement to oxygenation is most likely secondary to pulmonary hypertension and less likely heart failure or pneumonia.  Plan is to resume sildenafil today, continue pulmonary toilet and strict intake and output. -Continue Remodulin infusion -Continue torsemide 40 mg BID, spironolactone 12.5 mg daily, sildenafil -Strict intake and output -Daily weight  #RLECellulitis vs stasis dermatitis -s/p vancomycin (3/3)  #Obstructive sleep apnea -CPAP nightly  #Type 2 diabetes mellitus -Continue SSI  #Paroxysmal atrial fibrillation/Flutter In sinus rhythm. -Continue Xarelto  FEN: Replace electrolytes as needed,heart healthy/carb modified diet VTE prophylaxis: Xarelto CODE STATUS:  Full code(per  chart review, she would not want long-term dependence on ventilator but doesdesirefull scope of care)   Jean Rosenthal, MD 06/19/2018, 6:01 AM Pager: [phone] Internal Medicine Teaching Service

## 2018-06-19 NOTE — Progress Notes (Signed)
RT placed patient on CPAP HS auto. 15L bleed in needed. Patient tolerating well at this time.

## 2018-06-19 NOTE — Progress Notes (Signed)
Advanced Heart Failure Rounding Note   Subjective:    Got IV lasix yesterday with good urine output. Weight down over 6 pounds.   Still hypoxic with sats in high 80s on HFNC. Desatted to 69s off O2. CXR with worsening airspace disease  Afebrile. Denies productive sputum. No orthopnea or PND  BCx 1/2 CNS (contaminant)    Objective:   Weight Range:  Vital Signs:   Temp:  [97.8 F (36.6 C)-98.9 F (37.2 C)] 98.4 F (36.9 C) (06/15 0800) Pulse Rate:  [84-96] 91 (06/15 0405) Resp:  [15-29] 16 (06/15 0405) BP: (106-131)/(72-89) 125/72 (06/15 0300) SpO2:  [88 %-94 %] 90 % (06/15 0405) FiO2 (%):  [100 %] 100 % (06/15 0405) Weight:  [122.2 kg] 122.2 kg (06/15 0300) Last BM Date: 06/17/18  Weight change: Filed Weights   06/17/18 0300 06/18/18 0300 06/19/18 0300  Weight: 124.9 kg 125.1 kg 122.2 kg    Intake/Output:   Intake/Output Summary (Last 24 hours) at 06/19/2018 0828 Last data filed at 06/19/2018 0700 Gross per 24 hour  Intake 2146.96 ml  Output 3950 ml  Net -1803.04 ml     Physical Exam: General:  Lying in bed .On 15L [place] HEENT: normal Neck: supple. no JVD. Carotids 2+ bilat; no bruits. No lymphadenopathy or thryomegaly appreciated. Cor: PMI nondisplaced. Regular rate & rhythm. No rubs, gallops or murmurs. Lungs: coarse Abdomen: obese soft, nontender, nondistended. No hepatosplenomegaly. No bruits or masses. Good bowel sounds. Extremities: no cyanosis, clubbing, rash, bilateral venous stasis changes. RLE cellulitis improved Neuro: alert & orientedx3, cranial nerves grossly intact. moves all 4 extremities w/o difficulty. Affect pleasant  Telemetry: NSR 90s Personally reviewed   Labs: Basic Metabolic Panel: Recent Labs  Lab 06/15/18 0744 06/16/18 0743 06/17/18 0643 06/18/18 0618  NA 138 139 138 138  K 4.1 4.1 3.6 3.1*  CL 103 105 100 94*  CO2 _0 32  GLUCOSE 118* 108* 118* 92  BUN _1 CREATININE 0.80 0.86 0.78 0.73  CALCIUM 9.0 8.6*  8.2* 8.2*  MG  --  1.9  --   --   PHOS  --  3.6  --   --     Liver Function Tests: Recent Labs  Lab 06/15/18 0744  AST 42*  ALT 24  ALKPHOS 76  BILITOT 0.7  PROT 7.1  ALBUMIN 3.7   No results for input(s): LIPASE, AMYLASE in the last 168 hours. No results for input(s): AMMONIA in the last 168 hours.  CBC: Recent Labs  Lab 06/15/18 0744 06/16/18 0743 06/17/18 0643 06/18/18 1051  WBC 24.8* 30.1* 18.2* 11.3*  NEUTROABS 22.4*  --   --   --   HGB 16.3* 15.6* 15.3* 15.0  HCT 50.6* 48.4* 47.6* 46.4*  MCV 91.7 90.0 90.8 90.4  PLT 388 323 320 302    Cardiac Enzymes: No results for input(s): CKTOTAL, CKMB, CKMBINDEX, TROPONINI in the last 168 hours.  BNP: BNP (last 3 results) Recent Labs    12/22/17 1247 06/15/18 0744  BNP 47.4 49.0    ProBNP (last 3 results) No results for input(s): PROBNP in the last 8760 hours.    Other results:  Imaging: Dg Chest Port 1 View  Result Date: 06/19/2018 CLINICAL DATA:  Shortness of breath.  Pneumonia. EXAM: PORTABLE CHEST 1 VIEW COMPARISON:  Radiographs 06/16/2018 and 06/15/2018. FINDINGS: 0628 hours. Stable cardiomegaly and chronic vascular congestion. The right IJ central venous catheter tip is unchanged near the superior cavoatrial junction. Left-greater-than-right bibasilar  airspace opacities have slightly worsened compared with the recent prior studies. There may be a small left pleural effusion. No pneumothorax. The bones appear unchanged. IMPRESSION: Interval slight worsening of bibasilar airspace opacities consistent with worsening pneumonia or superimposed atelectasis. Possible small left pleural effusion. Electronically Signed   By: Richardean Sale M.D.   On: 06/19/2018 08:12     Medications:     Scheduled Medications:  gabapentin  600 mg Oral TID   insulin aspart  0-9 Units Subcutaneous TID WC   pantoprazole  20 mg Oral BID   pravastatin  20 mg Oral Daily   rivaroxaban  20 mg Oral Q supper   spironolactone   12.5 mg Oral Daily   torsemide  40 mg Oral BID    Infusions:  sodium chloride 250 mL (06/18/18 0916)   azithromycin 250 mg (06/18/18 0919)   cefTRIAXone (ROCEPHIN)  IV 1 g (06/18/18 1132)   treprostinil (REMODULIN) infusion 77 ng/kg/min (06/15/18 1145)    PRN Medications: sodium chloride, acetaminophen **OR** acetaminophen, fluticasone, ipratropium-albuterol, ondansetron **OR** ondansetron (ZOFRAN) IV   Assessment/Plan:   1. Acute on chronic hypoxic respiratory failure due to possible CAP - On admit CXR concerning for possible retrocardiac infiltrate. - Blood Cultures 1/2 CNS. Likely contaminant  - On azithromycin and ceftriaxone (day 5/7). Finished 3 days vanc today   - Diuresed very well and now euvolemic but remains quite hypoxic with worsening infiltrate on CXR. Will need aggressive pulmonary toilet with flutter valve and IS. Restart PDE-5. Consider Pulmonary involvement if not improving.   2. Possible RLE cellulitis -  Resolved. Received vanc x 3 days per IMTS  3. Acute Chronic diastolic HF/cor pulmonale:  - Diuresis much improved with IV lasix yesterday. Now looks euvolemic. Doubt we will get much more from further diuresis - She is drinking a lot of fluids have restricted to 1800cc - Continue  torsemide  4. 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 However PAH far out of proportion to L-sided pressures or restrictive lung disease. - Follows with [facility]. Last seen 10/2017 - Remains on Remodulin. Cannot stop remodulin due to rebound pulmonary hypertensin.  - Restart sildenafil  5. Paroxysmal atrial flutter.  -In Sinus - Continue Xarelto.No bleeding   6. Hypokalemia - Needs BMET today  Length of Stay: 4   Glori Bickers MD 06/19/2018, 8:28 AM  Advanced Heart Failure Team Pager [phone] (M-F; [place])  Please contact [facility] for night-coverage after hours (4p -7a ) and weekends on [email or web address]

## 2018-06-19 NOTE — Progress Notes (Signed)
Patient o2 sats maintained at 88-90% when being repositioned and turning for bed bath. Patient recovered 90-92% on HFNC.   Patient pulled c pap off this morning wanting her HFNC back on. 02 sats dropped to 70-74%. No mentation changes but this took a while to recover once HFNC was placed back on to 88-90%. Resp continued to be observed. Other than this event patient has maintained sats above 87% during the night.

## 2018-06-20 ENCOUNTER — Telehealth: Payer: Self-pay | Admitting: *Deleted

## 2018-06-20 DIAGNOSIS — E876 Hypokalemia: Secondary | ICD-10-CM

## 2018-06-20 LAB — BASIC METABOLIC PANEL
Anion gap: 9 (ref 5–15)
BUN: 9 mg/dL (ref 6–20)
CO2: 39 mmol/L — ABNORMAL HIGH (ref 22–32)
Calcium: 8.3 mg/dL — ABNORMAL LOW (ref 8.9–10.3)
Chloride: 93 mmol/L — ABNORMAL LOW (ref 98–111)
Creatinine, Ser: 0.73 mg/dL (ref 0.44–1.00)
GFR calc Af Amer: >60 mL/min (ref >60)
GFR calc non Af Amer: >60 mL/min (ref >60)
Glucose, Bld: 106 mg/dL — ABNORMAL HIGH (ref 70–99)
Potassium: 3.1 mmol/L — ABNORMAL LOW (ref 3.5–5.1)
Sodium: 141 mmol/L (ref 135–145)

## 2018-06-20 LAB — CBC
HCT: 49.1 % — ABNORMAL HIGH (ref 36.0–46.0)
Hemoglobin: 15.7 g/dL — ABNORMAL HIGH (ref 12.0–15.0)
MCH: 28.8 pg (ref 26.0–34.0)
MCHC: 32 g/dL (ref 30.0–36.0)
MCV: 89.9 fL (ref 80.0–100.0)
Platelets: 297 10*3/uL (ref 150–400)
RBC: 5.46 MIL/uL — ABNORMAL HIGH (ref 3.87–5.11)
RDW: 18.7 % — ABNORMAL HIGH (ref 11.5–15.5)
WBC: 10.4 10*3/uL (ref 4.0–10.5)
nRBC: 0 % (ref 0.0–0.2)

## 2018-06-20 LAB — GLUCOSE, CAPILLARY
Glucose-Capillary: 130 mg/dL — ABNORMAL HIGH (ref 70–99)
Glucose-Capillary: 110 mg/dL — ABNORMAL HIGH (ref 70–99)
Glucose-Capillary: 112 mg/dL — ABNORMAL HIGH (ref 70–99)
Glucose-Capillary: 155 mg/dL — ABNORMAL HIGH (ref 70–99)

## 2018-06-20 LAB — CULTURE, BLOOD (ROUTINE X 2)
Culture: NO GROWTH
Special Requests: ADEQUATE

## 2018-06-20 SURGERY — BRONCHOSCOPY, WITH FLUOROSCOPY
Anesthesia: Moderate Sedation | Laterality: Bilateral

## 2018-06-20 MED ORDER — POTASSIUM CHLORIDE CRYS ER 20 MEQ PO TBCR
40.0000 meq | EXTENDED_RELEASE_TABLET | Freq: Once | ORAL | Status: AC
  Administered 2018-06-20: 40 meq via ORAL
  Filled 2018-06-20: qty 2

## 2018-06-20 MED ORDER — VITAMIN B-12 1000 MCG PO TABS
500.0000 ug | ORAL_TABLET | Freq: Every day | ORAL | Status: DC
  Administered 2018-06-20 – 2018-07-03 (×14): 500 ug via ORAL
  Filled 2018-06-20 (×14): qty 1

## 2018-06-20 MED ORDER — POTASSIUM CHLORIDE CRYS ER 20 MEQ PO TBCR
40.0000 meq | EXTENDED_RELEASE_TABLET | ORAL | Status: AC
  Administered 2018-06-20 (×3): 40 meq via ORAL
  Filled 2018-06-20 (×3): qty 2

## 2018-06-20 MED ORDER — SPIRONOLACTONE 25 MG PO TABS
25.0000 mg | ORAL_TABLET | Freq: Every day | ORAL | Status: DC
  Administered 2018-06-21 – 2018-07-03 (×13): 25 mg via ORAL
  Filled 2018-06-20 (×13): qty 1

## 2018-06-20 MED ORDER — TORSEMIDE 20 MG PO TABS
60.0000 mg | ORAL_TABLET | Freq: Two times a day (BID) | ORAL | Status: DC
  Administered 2018-06-20: 60 mg via ORAL
  Filled 2018-06-20: qty 3

## 2018-06-20 MED ORDER — TORSEMIDE 20 MG PO TABS
20.0000 mg | ORAL_TABLET | Freq: Once | ORAL | Status: AC
  Administered 2018-06-20: 20 mg via ORAL

## 2018-06-20 NOTE — Progress Notes (Signed)
   Subjective: HD#5   Overnight: No acute events reported  Today, Teauna A Zelaya was examined at bedside this morning and does not report any change regards to her dyspnea.  She is well aware of her medical diagnosis and treatment regimen.  We did have a discussion regarding barriers to her improvement including atelectasis which will improve with flutter valve and aggressive pulmonary toilet.  Objective:  Vital signs in last 24 hours: Vitals:   06/19/18 1942 06/19/18 2256 06/19/18 2333 06/20/18 0325  BP:   120/82 111/65  Pulse: 96 95 91 88  Resp: (!) 23 (!) 24    Temp:   98.5 F (36.9 C) 98.6 F (37 C)  TempSrc:   Axillary Axillary  SpO2: (!) 89% 90% 94% 94%  Weight:      Height:       Const: In no apparent distress, lying comfortably in bed, conversational Resp: Bibasilar crackles, SPO2 88-90% on high flow nasal cannula CV: RRR, no murmurs, gallop, rub  Assessment/Plan:  Principal Problem:   CAP (community acquired pneumonia) Active Problems:   Obstructive sleep apnea   Diabetes (HCC)   Right heart failure, NYHA class 3 (HCC)   Pulmonary hypertension (HCC)   Chronic diastolic CHF (congestive heart failure) (HCC)   Acute on chronic respiratory failure (HCC)   PAH (pulmonary artery hypertension) ([place])  [name] is a 58 year old woman with medical history significant for multifactorial pulmonary arterial hypertension, chronic hypoxic respiratory failure on 4 L nasal cannula at home, chronic venous stasis, paroxysmal atrial fibrillation, type 2 diabetes mellitus, obstructive sleep apnea on CPAP, chronic diastolic heart failure who presented with productive cough and rigors found to have possible retrocardiac pulmonary infiltrate currently managing her for community-acquired pneumoniaand pulmonary hypertension.  #Acute on chronic hypoxic respiratory failure #Community-acquired pneumonia (retrocardiac) Her clinical course is somewhat improving however she still requiring  high flow nasal cannula.  Our hope is that with aggressive pulmonary toilet, incentive spirometry and ambulation, she will turn around and subsequently wean her supplemental oxygen to her baseline of 4 L nasal cannula.  She was able to demonstrate to the team how to use the incentive spirometer however only got up to 500. -Continue supplemental oxygen and wean as tolerated -Continue azithromycin(Day6),ceftriaxone(Day6), Vancomycin(Day3/3)  -Continue pulmonary toilet, incentive spirometry, inhalers -Follow-up CBC  #Pulmonary hypertension, cor pulmonale #Chronic diastolic heart failure No signs of volume overload on physical exams and reassuring that her weight had been trending down for the past several days. -Continue Remodulin infusion -Continue torsemide 40 mgBID, spironolactone 12.5 mg daily, sildenafil -Strict intake and output -Daily weight  #Hypokalemia: K+ of 3.1 today.  We will continue oral repletion with K-Dur  #Obstructive sleep apnea -CPAP nightly  #Paroxysmal atrial fibrillation/Flutter Continue to remain in normal sinus rhythm -Continue Xarelto  FEN: Replace electrolytes as needed,heart healthy/carb modified diet VTE prophylaxis: Xarelto CODE STATUS: Full code(per chart review, she would not want long-term dependence on ventilator but doesdesirefull scope of care)   Jean Rosenthal, MD 06/20/2018, 6:35 AM Pager: [phone] Internal Medicine Teaching Service

## 2018-06-20 NOTE — Telephone Encounter (Signed)
Call to [employer] for PA for Diclofenac Gel 1%.  Approved for 1 month 06/20/2018 thru 07/20/2018.  Monthly approvals can be done by pharmacy.  PA # [id number].   [phone] / [phone].

## 2018-06-20 NOTE — Progress Notes (Signed)
Advanced Heart Failure Rounding Note   Subjective:    Remains on HFNC but now 10L. Sats 89%  Not using flutter valve or IS. No fevers. Mildly dyspneic. Tadalafil restarted yesterday. Remains on remodulin BO stable. Continues to drink a lot of fluid but we have restricted her. No weight on chart today    BCx 1/2 CNS (contaminant)    Objective:   Weight Range:  Vital Signs:   Temp:  [98.4 F (36.9 C)-98.6 F (37 C)] 98.6 F (37 C) (06/16 0325) Pulse Rate:  [88-96] 89 (06/16 0817) Resp:  [16-25] 20 (06/16 0817) BP: (108-123)/(60-82) 115/60 (06/16 0817) SpO2:  [88 %-94 %] 91 % (06/16 0751) FiO2 (%):  [100 %] 100 % (06/16 0751) Last BM Date: 06/18/18  Weight change: Filed Weights   06/17/18 0300 06/18/18 0300 06/19/18 0300  Weight: 124.9 kg 125.1 kg 122.2 kg    Intake/Output:   Intake/Output Summary (Last 24 hours) at 06/20/2018 1006 Last data filed at 06/20/2018 0818 Gross per 24 hour  Intake 1200 ml  Output 3150 ml  Net -1950 ml     Physical Exam: General:  Lying in bed .On 15L [place] HEENT: normal flushed Neck: supple. Unable to see JVD  Carotids 2+ bilat; no bruits. No lymphadenopathy or thryomegaly appreciated. Cor: PMI nondisplaced. Regular rate & rhythm. 2/6 TR Lungs: coarse no wheeze Abdomen: obese soft, nontender, nondistended. No hepatosplenomegaly. No bruits or masses. Good bowel sounds. Extremities: no cyanosis, clubbing, rash, 1+ edema with chronic venous stasis changes  Neuro: alert & orientedx3, cranial nerves grossly intact. moves all 4 extremities w/o difficulty. Affect pleasant   Telemetry: NSR 80- 90s Personally reviewed   Labs: Basic Metabolic Panel: Recent Labs  Lab 06/16/18 0743 06/17/18 0643 06/18/18 0618 06/19/18 0634 06/20/18 0611  NA 139 138 138 140 141  K 4.1 3.6 3.1* 4.2 3.1*  CL 105 100 94* 95* 93*  CO2 25 28 32 35* 39*  GLUCOSE 108* 118* 92 113* 106*  BUN _0 CREATININE 0.86 0.78 0.73 0.65 0.73  CALCIUM 8.6*  8.2* 8.2* 8.2* 8.3*  MG 1.9  --   --  1.7  --   PHOS 3.6  --   --   --   --     Liver Function Tests: Recent Labs  Lab 06/15/18 0744  AST 42*  ALT 24  ALKPHOS 76  BILITOT 0.7  PROT 7.1  ALBUMIN 3.7   No results for input(s): LIPASE, AMYLASE in the last 168 hours. No results for input(s): AMMONIA in the last 168 hours.  CBC: Recent Labs  Lab 06/15/18 0744 06/16/18 0743 06/17/18 0643 06/18/18 1051 06/20/18 0611  WBC 24.8* 30.1* 18.2* 11.3* 10.4  NEUTROABS 22.4*  --   --   --   --   HGB 16.3* 15.6* 15.3* 15.0 15.7*  HCT 50.6* 48.4* 47.6* 46.4* 49.1*  MCV 91.7 90.0 90.8 90.4 89.9  PLT 388 323 320 302 297    Cardiac Enzymes: No results for input(s): CKTOTAL, CKMB, CKMBINDEX, TROPONINI in the last 168 hours.  BNP: BNP (last 3 results) Recent Labs    12/22/17 1247 06/15/18 0744  BNP 47.4 49.0    ProBNP (last 3 results) No results for input(s): PROBNP in the last 8760 hours.    Other results:  Imaging: Dg Chest Port 1 View  Result Date: 06/19/2018 CLINICAL DATA:  Shortness of breath.  Pneumonia. EXAM: PORTABLE CHEST 1 VIEW COMPARISON:  Radiographs 06/16/2018 and 06/15/2018.  FINDINGS: 0628 hours. Stable cardiomegaly and chronic vascular congestion. The right IJ central venous catheter tip is unchanged near the superior cavoatrial junction. Left-greater-than-right bibasilar airspace opacities have slightly worsened compared with the recent prior studies. There may be a small left pleural effusion. No pneumothorax. The bones appear unchanged. IMPRESSION: Interval slight worsening of bibasilar airspace opacities consistent with worsening pneumonia or superimposed atelectasis. Possible small left pleural effusion. Electronically Signed   By: Richardean Sale M.D.   On: 06/19/2018 08:12     Medications:     Scheduled Medications: . gabapentin  600 mg Oral TID  . insulin aspart  0-9 Units Subcutaneous TID WC  . pantoprazole  20 mg Oral BID  . potassium chloride  40  mEq Oral Q4H  . pravastatin  20 mg Oral Daily  . rivaroxaban  20 mg Oral Q supper  . sildenafil  20 mg Oral TID  . spironolactone  12.5 mg Oral Daily  . torsemide  40 mg Oral BID    Infusions: . sodium chloride 250 mL (06/18/18 0916)  . azithromycin 250 mg (06/20/18 0957)  . cefTRIAXone (ROCEPHIN)  IV 1 g (06/20/18 0940)  . treprostinil (REMODULIN) infusion 77 ng/kg/min (06/15/18 1145)    PRN Medications: sodium chloride, acetaminophen **OR** acetaminophen, fluticasone, ipratropium-albuterol, ondansetron **OR** ondansetron (ZOFRAN) IV   Assessment/Plan:   1. Acute on chronic hypoxic respiratory failure due to possible CAP - On admit CXR concerning for possible retrocardiac infiltrate. - Blood Cultures 1/2 CNS. Likely contaminant  - On azithromycin and ceftriaxone (day 5/7). Finished 3 days vanc today   - Diuresed very well yesterday and now euvolemic but remainshypoxic with worsening infiltrate on CXR yesterday.  - Will need aggressive pulmonary toilet with flutter valve and IS. I spent a long time this am educating her on need for these devices and have asked RN to bring IS device to bedside. I watched her technique with flutter valve. Also needs PT for mobilization   2. Possible RLE cellulitis -  Resolved. Received vanc x 3 days per IMTS  3. Acute Chronic diastolic HF/cor pulmonale:  - Diuresis much improved with IV lasix earlier in the week.  - She is drinking a lot of fluids have restricted to 1800cc - Increase torsemide to 60bid to keep her dry. Watch renal function and potassium closely   4. 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 However PAH far out of proportion to L-sided pressures or restrictive lung disease. - Follows with [facility]. Last seen 10/2017 - Remains on Remodulin. Cannot stop remodulin due to rebound pulmonary hypertensin.  - Restarted sildenafil 6/15  5. Paroxysmal atrial flutter.  -In Sinus - Continue  Xarelto.No bleeding   6. Hypokalemia - Continue to supp aggressively. Increase spiro to 25   Length of Stay: 5   Glori Bickers MD 06/20/2018, 10:06 AM  Advanced Heart Failure Team Pager [phone] (M-F; 7a - 4p)  Please contact [facility] for night-coverage after hours (4p -7a ) and weekends on [email or web address]

## 2018-06-21 ENCOUNTER — Encounter (HOSPITAL_COMMUNITY): Payer: Self-pay

## 2018-06-21 LAB — BASIC METABOLIC PANEL WITH GFR
Anion gap: 12 (ref 5–15)
BUN: 12 mg/dL (ref 6–20)
CO2: 34 mmol/L — ABNORMAL HIGH (ref 22–32)
Calcium: 8.7 mg/dL — ABNORMAL LOW (ref 8.9–10.3)
Chloride: 94 mmol/L — ABNORMAL LOW (ref 98–111)
Creatinine, Ser: 0.87 mg/dL (ref 0.44–1.00)
GFR calc Af Amer: >60 mL/min
GFR calc non Af Amer: >60 mL/min
Glucose, Bld: 115 mg/dL — ABNORMAL HIGH (ref 70–99)
Potassium: 3.5 mmol/L (ref 3.5–5.1)
Sodium: 140 mmol/L (ref 135–145)

## 2018-06-21 LAB — GLUCOSE, CAPILLARY
Glucose-Capillary: 108 mg/dL — ABNORMAL HIGH (ref 70–99)
Glucose-Capillary: 141 mg/dL — ABNORMAL HIGH (ref 70–99)
Glucose-Capillary: 105 mg/dL — ABNORMAL HIGH (ref 70–99)
Glucose-Capillary: 127 mg/dL — ABNORMAL HIGH (ref 70–99)

## 2018-06-21 LAB — MRSA PCR SCREENING: MRSA by PCR: NEGATIVE

## 2018-06-21 MED ORDER — POTASSIUM CHLORIDE CRYS ER 20 MEQ PO TBCR
40.0000 meq | EXTENDED_RELEASE_TABLET | ORAL | Status: AC
  Administered 2018-06-21 (×3): 40 meq via ORAL
  Filled 2018-06-21 (×4): qty 2

## 2018-06-21 MED ORDER — POTASSIUM CHLORIDE CRYS ER 20 MEQ PO TBCR
40.0000 meq | EXTENDED_RELEASE_TABLET | Freq: Once | ORAL | Status: AC
  Administered 2018-06-21: 40 meq via ORAL

## 2018-06-21 MED ORDER — TORSEMIDE 20 MG PO TABS
60.0000 mg | ORAL_TABLET | Freq: Two times a day (BID) | ORAL | Status: DC
  Administered 2018-06-21 – 2018-06-23 (×4): 60 mg via ORAL
  Filled 2018-06-21 (×4): qty 3

## 2018-06-21 MED ORDER — FUROSEMIDE 10 MG/ML IJ SOLN
80.0000 mg | Freq: Once | INTRAMUSCULAR | Status: AC
  Administered 2018-06-21: 80 mg via INTRAVENOUS
  Filled 2018-06-21: qty 8

## 2018-06-21 NOTE — Progress Notes (Signed)
   Subjective: HD#6   Overnight: No acute overnight events reported.  Today, [name]  says yesterday her oxygen was decreased down to 23-25 but not below that. She states at 35 the oxygen was going too fast and irritating her nostrils. She states yesterday she was able to ambulate to her chair without difficulty. She mentions her legs felt weak due to staying bed-bound but denies any dyspnea. Discussed w/ Ms.Newton about importance of participating with PT to try to ambulate the halls and improve her strength.  Objective:  Vital signs in last 24 hours: Vitals:   06/21/18 0038 06/21/18 0043 06/21/18 0358 06/21/18 0414  BP:   124/76   Pulse:   91   Resp:   (!) 27   Temp:   98 F (36.7 C)   TempSrc:   Oral   SpO2: 92% 91% 95%   Weight:    124.2 kg  Height:       Const: In no apparent distress, lying comfortably in bed, conversational HEENT: Atraumatic, normocephalic Resp: Persistent bibasilar crackles, no wheezes, rhonchi CV: RRR, no murmurs, gallop, rub  Assessment/Plan:  Principal Problem:   CAP (community acquired pneumonia) Active Problems:   Obstructive sleep apnea   Diabetes ([place])   Right heart failure, NYHA class 3 (HCC)   Pulmonary hypertension (HCC)   Chronic diastolic CHF (congestive heart failure) (HCC)   Acute on chronic respiratory failure (HCC)   PAH (pulmonary artery hypertension) ([place])  [name] is a 58 year old woman with medical history significant for multifactorial pulmonary arterial hypertension, chronic hypoxic respiratory failure on 4 L nasal cannula at home, chronic venous stasis, paroxysmal atrial fibrillation, type 2 diabetes mellitus, obstructive sleep apnea on CPAP, chronic diastolic heart failure who presented with productive cough and rigors found to have possible retrocardiac pulmonary infiltrate currently managing her for community-acquired pneumoniaand pulmonary hypertension.  #Acute on chronic hypoxic respiratory failure  #Community-acquired pneumonia (retrocardiac) She has received a total of 7-day course of azithromycin and ceftriaxone with good objective response.  Clinically, still requiring high flow nasal cannula at about 25 L.  She is compliant with incentive spirometer and flutter valve.  She was encouraged to ambulate today -Continue supplemental oxygen and wean as tolerated -Continue azithromycin(Day7/7),ceftriaxone(Day7/7), Vancomycin(Day3/3) -Continue pulmonary toilet, incentive spirometry, inhalers  #Pulmonary hypertension, cor pulmonale #Chronic diastolic heart failure She still has persistent bibasilar crackles.  Her weight is up about 2 kg from a couple days ago.  This makes me wonder if she will require further diuresis however it will be challenging as her pulmonary hypertension will benefit from increased preload and will likely worsen from overdiuresis.  We appreciate heart failure assistance. -Continue Remodulin infusion -Continue torsemide 40 mgBID, spironolactone 12.5 mg daily, sildenafil -Strict intake and output -Daily weight  #Obstructive sleep apnea -CPAP nightly  #Paroxysmal atrial fibrillation/Flutter Continue to remain in normal sinus rhythm -Continue Xarelto  FEN: Replace electrolytes as needed,heart healthy/carb modified diet VTE prophylaxis: Xarelto CODE STATUS: Full code(per chart review, she would not want long-term dependence on ventilator but doesdesirefull scope of care)  Jean Rosenthal, MD 06/21/2018, 6:07 AM Pager: [phone] Internal Medicine Teaching Service

## 2018-06-21 NOTE — Progress Notes (Signed)
Advanced Heart Failure Rounding Note   Subjective:    Remains on HFNC but now 10L. Sats were 92-93% when I walked in room.   On oral diuretics. Weight up 4 pounds. She is drinking a lot of fluids. Potassium still low.   No fevers. No problem with remodulin. We worked through the flutter valve and IS again today. Oly can get IS to 600-750. I set gaol of 1000 for today. She had productive cough after flutter valve.   Objective:   Weight Range:  Vital Signs:   Temp:  [97.9 F (36.6 C)-98.1 F (36.7 C)] 98.1 F (36.7 C) (06/17 0733) Pulse Rate:  [88-98] 91 (06/17 0733) Resp:  [18-27] 19 (06/17 0733) BP: (115-141)/(55-76) 117/74 (06/17 0733) SpO2:  [88 %-95 %] 90 % (06/17 0733) FiO2 (%):  [100 %] 100 % (06/16 2015) Weight:  [124.2 kg] 124.2 kg (06/17 0414) Last BM Date: 06/20/18  Weight change: Filed Weights   06/18/18 0300 06/19/18 0300 06/21/18 0414  Weight: 125.1 kg 122.2 kg 124.2 kg    Intake/Output:   Intake/Output Summary (Last 24 hours) at 06/21/2018 0743 Last data filed at 06/21/2018 0738 Gross per 24 hour  Intake 1165 ml  Output 3451 ml  Net -2286 ml     Physical Exam: General:  Sitting up in bed on HFNC HEENT: normal Neck: supple. JVP to ear. Carotids 2+ bilat; no bruits. No lymphadenopathy or thryomegaly appreciated. Cor: PMI nondisplaced. Regular rate & rhythm. No rubs, gallops or murmurs. Lungs: coarse with crackles in bases Abdomen: obese soft, nontender, nondistended. No hepatosplenomegaly. No bruits or masses. Good bowel sounds. Extremities: no cyanosis, clubbing, rash, 1+ edema with chronic venous stasis changes Neuro: alert & orientedx3, cranial nerves grossly intact. moves all 4 extremities w/o difficulty. Affect pleasant   Telemetry: NSR 90s Personally reviewed   Labs: Basic Metabolic Panel: Recent Labs  Lab 06/16/18 0743 06/17/18 0643 06/18/18 0618 06/19/18 0634 06/20/18 0611 06/21/18 0630  NA 139 138 138 140 141 140  K 4.1 3.6  3.1* 4.2 3.1* 3.5  CL 105 100 94* 95* 93* 94*  CO2 25 28 32 35* 39* 34*  GLUCOSE 108* 118* 92 113* 106* 115*  BUN _0 CREATININE 0.86 0.78 0.73 0.65 0.73 0.87  CALCIUM 8.6* 8.2* 8.2* 8.2* 8.3* 8.7*  MG 1.9  --   --  1.7  --   --   PHOS 3.6  --   --   --   --   --     Liver Function Tests: Recent Labs  Lab 06/15/18 0744  AST 42*  ALT 24  ALKPHOS 76  BILITOT 0.7  PROT 7.1  ALBUMIN 3.7   No results for input(s): LIPASE, AMYLASE in the last 168 hours. No results for input(s): AMMONIA in the last 168 hours.  CBC: Recent Labs  Lab 06/15/18 0744 06/16/18 0743 06/17/18 0643 06/18/18 1051 06/20/18 0611  WBC 24.8* 30.1* 18.2* 11.3* 10.4  NEUTROABS 22.4*  --   --   --   --   HGB 16.3* 15.6* 15.3* 15.0 15.7*  HCT 50.6* 48.4* 47.6* 46.4* 49.1*  MCV 91.7 90.0 90.8 90.4 89.9  PLT 388 323 320 302 297    Cardiac Enzymes: No results for input(s): CKTOTAL, CKMB, CKMBINDEX, TROPONINI in the last 168 hours.  BNP: BNP (last 3 results) Recent Labs    12/22/17 1247 06/15/18 0744  BNP 47.4 49.0    ProBNP (last 3 results) No results  for input(s): PROBNP in the last 8760 hours.    Other results:  Imaging: No results found.   Medications:     Scheduled Medications: . gabapentin  600 mg Oral TID  . insulin aspart  0-9 Units Subcutaneous TID WC  . pantoprazole  20 mg Oral BID  . potassium chloride  40 mEq Oral Q4H  . pravastatin  20 mg Oral Daily  . rivaroxaban  20 mg Oral Q supper  . sildenafil  20 mg Oral TID  . spironolactone  25 mg Oral Daily  . torsemide  60 mg Oral BID  . vitamin B-12  500 mcg Oral Daily    Infusions: . sodium chloride 250 mL (06/18/18 0916)  . azithromycin 250 mg (06/20/18 0957)  . cefTRIAXone (ROCEPHIN)  IV 1 g (06/20/18 0940)  . treprostinil (REMODULIN) infusion 77 ng/kg/min (06/15/18 1145)    PRN Medications: sodium chloride, acetaminophen **OR** acetaminophen, fluticasone, ipratropium-albuterol, ondansetron **OR**  ondansetron (ZOFRAN) IV   Assessment/Plan:   1. Acute on chronic hypoxic respiratory failure due to possible CAP - On admit CXR concerning for possible retrocardiac infiltrate. - Blood Cultures 1/2 CNS. Likely contaminant  - On azithromycin and ceftriaxone (day 5/7). Finished 3 days vanc for cellulitis as well  - Remains on HFNC.  - Continue aggressive pulmonary toilet with flutter valve and IS. I spent a long time again this am educating her on both and showing her technique. Goal IS is 1000 for today.. Also needs PT for mobilization  - Volume status worse. Give 1 dose IV lasix today  2. Possible RLE cellulitis -  Resolved. Received vanc x 3 days per IMTS  3. Acute Chronic diastolic HF/cor pulmonale:  - She is drinking a lot of fluids have restricted to 1800cc - Continue torsemide at 60bid (higher dose) to keep her dry. Will give one dose IV lasix again today.  Watch renal function and potassium closely   4. 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 However PAH far out of proportion to L-sided pressures or restrictive lung disease. - Follows with [facility]. Last seen 10/2017 - Remains on Remodulin. Cannot stop remodulin due to rebound pulmonary hypertensin.  - Restarted sildenafil 6/15  5. Paroxysmal atrial flutter.  -In Sinus - Continue Xarelto.No bleeding   6. Hypokalemia - Continue to supp aggressively. Continue spiro 25. Discussed dosing with PharmD personally.   Length of Stay: 6   Glori Bickers MD 06/21/2018, 7:43 AM  Advanced Heart Failure Team Pager [phone] (M-F; [place])  Please contact [facility] for night-coverage after hours (4p -7a ) and weekends on [email or web address]

## 2018-06-21 NOTE — Progress Notes (Signed)
RT placed patient on CPAP HS. 15L O2 bleed in needed. Patient tolerating well at this time.

## 2018-06-22 LAB — BASIC METABOLIC PANEL
Anion gap: 7 (ref 5–15)
BUN: 13 mg/dL (ref 6–20)
CO2: 37 mmol/L — ABNORMAL HIGH (ref 22–32)
Calcium: 8.7 mg/dL — ABNORMAL LOW (ref 8.9–10.3)
Chloride: 96 mmol/L — ABNORMAL LOW (ref 98–111)
Creatinine, Ser: 0.77 mg/dL (ref 0.44–1.00)
GFR calc Af Amer: >60 mL/min (ref >60)
GFR calc non Af Amer: >60 mL/min (ref >60)
Glucose, Bld: 117 mg/dL — ABNORMAL HIGH (ref 70–99)
Potassium: 4 mmol/L (ref 3.5–5.1)
Sodium: 140 mmol/L (ref 135–145)

## 2018-06-22 LAB — GLUCOSE, CAPILLARY
Glucose-Capillary: 90 mg/dL (ref 70–99)
Glucose-Capillary: 110 mg/dL — ABNORMAL HIGH (ref 70–99)
Glucose-Capillary: 107 mg/dL — ABNORMAL HIGH (ref 70–99)
Glucose-Capillary: 115 mg/dL — ABNORMAL HIGH (ref 70–99)
Glucose-Capillary: 127 mg/dL — ABNORMAL HIGH (ref 70–99)

## 2018-06-22 LAB — MAGNESIUM: Magnesium: 1.9 mg/dL (ref 1.7–2.4)

## 2018-06-22 MED ORDER — DIPHENHYDRAMINE HCL 25 MG PO CAPS
25.0000 mg | ORAL_CAPSULE | Freq: Four times a day (QID) | ORAL | Status: AC | PRN
  Administered 2018-06-22: 25 mg via ORAL
  Filled 2018-06-22: qty 1

## 2018-06-22 MED ORDER — TREPROSTINIL 100 MG/20ML IJ SOLN
77.0000 ng/kg/min | INTRAVENOUS | Status: DC
  Filled 2018-06-22: qty 6

## 2018-06-22 MED ORDER — POTASSIUM CHLORIDE CRYS ER 20 MEQ PO TBCR
40.0000 meq | EXTENDED_RELEASE_TABLET | Freq: Three times a day (TID) | ORAL | Status: DC
  Administered 2018-06-22 – 2018-06-27 (×16): 40 meq via ORAL
  Filled 2018-06-22 (×16): qty 2

## 2018-06-22 NOTE — Progress Notes (Addendum)
   Subjective: HD#7   Overnight: No acute events reported  Today, Nissi A Heimann states she is feeling better but feeling tired due to difficulty sleeping. She states the high flow nasal cannula can be irritating and have been having difficulty falling asleep. She was observed using incentive spirometery with inspiratory effort up 500. She states she was able to ambulate yesterday and was able to walk to the bedside chair but mentions that she has not attempted longer walks. Discussed importance of getting out of bed for ambulation and importance of asking the nurses to ambulate with assistance.  Objective:  Vital signs in last 24 hours: Vitals:   06/21/18 2258 06/21/18 2316 06/22/18 0000 06/22/18 0323  BP: 131/76  139/81 119/70  Pulse: 96 98 97 93  Resp: (!) 28 (!) 26 (!) 25 20  Temp: 98.4 F (36.9 C)   (!) 97 F (36.1 C)  TempSrc: Oral   Axillary  SpO2: (!) 87% 91% 93% 94%  Weight:    121.8 kg  Height:       Const: Lying in bed comfortably, in no apparent distress Extremities: No lower extremity edema Psych: Mood appropriate  Assessment/Plan:  Principal Problem:   CAP (community acquired pneumonia) Active Problems:   Obstructive sleep apnea   Diabetes (HCC)   Right heart failure, NYHA class 3 (HCC)   Pulmonary hypertension (HCC)   Chronic diastolic CHF (congestive heart failure) (HCC)   Acute on chronic respiratory failure (HCC)   PAH (pulmonary artery hypertension) ([place])  Ms. Haraway is a 58 year old woman with medical history significant for multifactorial pulmonary arterial hypertension, chronic hypoxic respiratory failure on 4 L nasal cannula at home, chronic venous stasis, paroxysmal atrial fibrillation, type 2 diabetes mellitus, obstructive sleep apnea on CPAP, chronic diastolic heart failure who presented with productive cough and rigors found to have possible retrocardiac pulmonary infiltrate for which she has completed a 7-day course of ceftriaxone and azithromycin.   We are currently managing her her pulmonary hypertension.  #Pulmonary hypertension, cor pulmonale #Chronic diastolic heart failure She is still requiring high flow nasal cannula at 25 L.  Her desire to ambulate and get out of bed to chair is low even after we have constantly insisted.  In addition, we have advised for continual use of incentive spirometer, flutter valve.  Her goal today for satisfactory is 1000.  Since admission, her total ins and out has been net -12 L.  Her weight is also down about 5 kg. -Continue Remodulin infusion -Continue torsemide 60 mgBID, spironolactone 25 mg daily, sildenafil -Strict intake and output -Daily weight -Appreciate PT/OT recommendation -Closely monitor electrolytes  #Acute on chronic hypoxic respiratory failure #Community-acquired pneumonia(retrocardiac) -Continue supplemental oxygenand wean as tolerated -Continue pulmonary toilet, incentive spirometry, inhalers -Status post azithromycin(Day7/7),ceftriaxone(Day7/7), Vancomycin(Day3/3)  #Paroxysmal atrial fibrillation/Flutter -Continue Xarelto  FEN: Replace electrolytes as needed,heart healthy/carb modified diet VTE prophylaxis: Xarelto CODE STATUS: Full code(per chart review, she would not want long-term dependence on ventilator but doesdesirefull scope of care)  Jean Rosenthal, MD 06/22/2018, 6:13 AM Pager: [phone] Internal Medicine Teaching Service

## 2018-06-22 NOTE — Progress Notes (Signed)
Patient switched over from CPAP to high flow nasal cannula 25L 100%. RT will try to wean oxygen throughout the day.

## 2018-06-22 NOTE — Progress Notes (Addendum)
Advanced Heart Failure Rounding Note   Subjective:    On CPAP this am. Sats 95-96%  Got IV lasix yesterday. Weight down 5 pounds. Now back on po diuretics.   Feels ok. Tired. Weak. Mild SOB. Afebrile.   Says she is using flutter valve and IS. Has only been out of bed to chair x 1.   Objective:   Weight Range:  Vital Signs:   Temp:  [97 F (36.1 C)-98.4 F (36.9 C)] 97 F (36.1 C) (06/18 0735) Pulse Rate:  [88-98] 93 (06/18 0735) Resp:  [12-36] 14 (06/18 0735) BP: (105-139)/(70-81) 105/77 (06/18 0735) SpO2:  [87 %-95 %] 95 % (06/18 0735) FiO2 (%):  [100 %] 100 % (06/17 1940) Weight:  [121.8 kg] 121.8 kg (06/18 0323) Last BM Date: 06/20/18(per pt)  Weight change: Filed Weights   06/19/18 0300 06/21/18 0414 06/22/18 0323  Weight: 122.2 kg 124.2 kg 121.8 kg    Intake/Output:   Intake/Output Summary (Last 24 hours) at 06/22/2018 0743 Last data filed at 06/21/2018 2115 Gross per 24 hour  Intake -  Output 2650 ml  Net -2650 ml     Physical Exam: General:  Sitting up in bed on CPAP HEENT: normal + CPAP maske Neck: supple. JVP 7-8 Carotids 2+ bilat; no bruits. No lymphadenopathy or thryomegaly appreciated. Cor: PMI nondisplaced. Regular rate & rhythm. No rubs, gallops or murmurs. Lungs: coarse Abdomen: obesesoft, nontender, nondistended. No hepatosplenomegaly. No bruits or masses. Good bowel sounds. Extremities: no cyanosis, clubbing, rash, tr-1+ edema with chronic venous stasis changes Neuro: alert & orientedx3, cranial nerves grossly intact. moves all 4 extremities w/o difficulty. Affect pleasant    Telemetry: NSR 90s Personally reviewed   Labs: Basic Metabolic Panel: Recent Labs  Lab 06/16/18 0743  06/18/18 0618 06/19/18 0634 06/20/18 0611 06/21/18 0630 06/22/18 0619  NA 139   < > 138 140 141 140 140  K 4.1   < > 3.1* 4.2 3.1* 3.5 4.0  CL 105   < > 94* 95* 93* 94* 96*  CO2 25   < > 32 35* 39* 34* 37*  GLUCOSE 108*   < > 92 113* 106* 115* 117*   BUN 11   < > _0 CREATININE 0.86   < > 0.73 0.65 0.73 0.87 0.77  CALCIUM 8.6*   < > 8.2* 8.2* 8.3* 8.7* 8.7*  MG 1.9  --   --  1.7  --   --  1.9  PHOS 3.6  --   --   --   --   --   --    < > = values in this interval not displayed.    Liver Function Tests: Recent Labs  Lab 06/15/18 0744  AST 42*  ALT 24  ALKPHOS 76  BILITOT 0.7  PROT 7.1  ALBUMIN 3.7   No results for input(s): LIPASE, AMYLASE in the last 168 hours. No results for input(s): AMMONIA in the last 168 hours.  CBC: Recent Labs  Lab 06/15/18 0744 06/16/18 0743 06/17/18 0643 06/18/18 1051 06/20/18 0611  WBC 24.8* 30.1* 18.2* 11.3* 10.4  NEUTROABS 22.4*  --   --   --   --   HGB 16.3* 15.6* 15.3* 15.0 15.7*  HCT 50.6* 48.4* 47.6* 46.4* 49.1*  MCV 91.7 90.0 90.8 90.4 89.9  PLT 388 323 320 302 297    Cardiac Enzymes: No results for input(s): CKTOTAL, CKMB, CKMBINDEX, TROPONINI in the last 168 hours.  BNP: BNP (last  3 results) Recent Labs    12/22/17 1247 06/15/18 0744  BNP 47.4 49.0    ProBNP (last 3 results) No results for input(s): PROBNP in the last 8760 hours.    Other results:  Imaging: No results found.   Medications:     Scheduled Medications: . gabapentin  600 mg Oral TID  . insulin aspart  0-9 Units Subcutaneous TID WC  . pantoprazole  20 mg Oral BID  . pravastatin  20 mg Oral Daily  . rivaroxaban  20 mg Oral Q supper  . sildenafil  20 mg Oral TID  . spironolactone  25 mg Oral Daily  . torsemide  60 mg Oral BID  . vitamin B-12  500 mcg Oral Daily    Infusions: . sodium chloride 250 mL (06/18/18 0916)  . treprostinil (REMODULIN) infusion 77 ng/kg/min (06/15/18 1145)    PRN Medications: sodium chloride, acetaminophen **OR** acetaminophen, fluticasone, ipratropium-albuterol, ondansetron **OR** ondansetron (ZOFRAN) IV   Assessment/Plan:   1. Acute on chronic hypoxic respiratory failure due to possible CAP - On admit CXR concerning for possible retrocardiac  infiltrate. - Blood Cultures 1/2 CNS. Likely contaminant  - Completed azithromycin and ceftriaxone x 7 days  Finished 3 days vanc for cellulitis as well  - Remains on HFNC.  - Continue aggressive pulmonary toilet with flutter valve and IS. I spent a long time again this am educating her on both and showing her technique. Goal IS is 1000 for today.. Also needs PT for mobilization  NEEDS TO GET OUT OF BED TODAY. I HAVE CONSULTED PT PERSONALLY TO HELP.  - Volume status improved with IV lasix on 6/17. Continue torsemide  2. Possible RLE cellulitis -  Resolved. Received vanc x 3 days per IMTS  3. Acute Chronic diastolic HF/cor pulmonale:  - She is drinking a lot of fluids have restricted to 1800cc - Continue torsemide at 60bid (higher dose) to keep her dry. She got one dose of IV lasix yesterday. Can continue to give touch-up doses as needed.  Watch renal function and potassium closely . Hypokalemia much improved thanks to Pharmacy dosing!  4. 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 However PAH far out of proportion to L-sided pressures or restrictive lung disease. - Follows with [facility]. Last seen 10/2017 - Remains on Remodulin. Cannot stop remodulin due to rebound pulmonary hypertensin.  - Restarted sildenafil 6/15  5. Paroxysmal atrial flutter.  -In Sinus - Continue Xarelto.No bleeding   6. Hypokalemia - Continue spiro 25.  Hypokalemia much improved thanks to Pharmacy dosing! Discussed dosing with PharmD personally.   Length of Stay: 7   Glori Bickers MD 06/22/2018, 7:43 AM  Advanced Heart Failure Team Pager [phone] (M-F; [place])  Please contact [facility] for night-coverage after hours (4p -7a ) and weekends on [email or web address]

## 2018-06-22 NOTE — Evaluation (Signed)
Physical Therapy Evaluation Patient Details Name: [name] MRN: [id number] DOB: 11/28/1960 Today's Date: 06/22/2018   History of Present Illness  58 y.o. female admitted on 06/15/18 for chills and productive cough.  Tested for COVID and was (-).  Dx with CAP, pulmonary HTN, cor pulmonale, chronic diastolic heart failure, acute on chronic hypoxic respiratory failure, and PAF.  Pt with other significant PMH of OSA, HTN, baseline mental disability, DM, COPD, anemia.    Clinical Impression  Session limited by pt being on 25L of HFNC (needed) and unable to walk further than the distance of her tubing.  Instead, we marched in place x 2 for 1-2 mins and preformed seated exercises.  She is min guard assist for mobility in place and on/off of BSC and I anticipate she will progress well enough to go home.  We will progress gait and mobility as she is able to decrease her O2 demand.   PT to follow acutely for deficits listed below.     Follow Up Recommendations Home health PT;Supervision - Intermittent    Equipment Recommendations  None recommended by PT(interested in getting more O2 tubing)    Recommendations for Other Services   NA    Precautions / Restrictions Precautions Precautions: Other (comment) Precaution Comments: monitor sats      Mobility  Bed Mobility               General bed mobility comments: Pt was OOB in the recliner chair.   Transfers Overall transfer level: Needs assistance Equipment used: Rolling walker (2 wheeled);None Transfers: Sit to/from [employer] to Stand: Min guard Stand pivot transfers: Min guard       General transfer comment: Min guard assist for safety and line management.   Ambulation/Gait             General Gait Details: Unable to ambulate due to pt on 25L HFNC and portable O2 tanks only go up to 15 L.  We were able to march in place with RW in standing x 2 for 1-2 mins each.           Balance Overall  balance assessment: Needs assistance Sitting-balance support: Feet supported;No upper extremity supported;Bilateral upper extremity supported;Single extremity supported Sitting balance-Leahy Scale: Good     Standing balance support: No upper extremity supported;Bilateral upper extremity supported;Single extremity supported Standing balance-Leahy Scale: Fair Standing balance comment: pt stood and marched in place holding to RW for 1-2 mins x 2 trials.  O2 sats decreased to 85% at lowest, HR 124.                               Pertinent Vitals/Pain Pain Assessment: No/denies pain    Home Living Family/patient expects to be discharged to:: Private residence Living Arrangements: Children(son who works) Available Help at Discharge: Family Type of Home: House Home Access: Stairs to enter Entrance Stairs-Rails: None(pt reports she uses her rollator backwards to get up/down) Technical brewer of Steps: 3 Home Layout: One level Home Equipment: Walker - 4 wheels Additional Comments: oxygen 4 L O2 [place]    Prior Function Level of Independence: Independent with assistive device(s)         Comments: pt reports she uses the rollator outside of the house, furniture walks inside of the house because it is tight and small.  She cooks her own meals while her son is at work.  Extremity/Trunk Assessment   Upper Extremity Assessment Upper Extremity Assessment: Overall WFL for tasks assessed    Lower Extremity Assessment Lower Extremity Assessment: Generalized weakness    Cervical / Trunk Assessment Cervical / Trunk Assessment: Normal  Communication   Communication: No difficulties  Cognition Arousal/Alertness: Awake/alert Behavior During Therapy: WFL for tasks assessed/performed Overall Cognitive Status: History of cognitive impairments - at baseline                                           Exercises General Exercises - Lower Extremity Long  Arc Quad: AROM;Both;10 reps Hip Flexion/Marching: AROM;Both;10 reps Toe Raises: AROM;Both;10 reps Heel Raises: AROM;Both;10 reps Other Exercises Other Exercises: Encouraged above exercises hourly while up in the chair.    Assessment/Plan    PT Assessment Patient needs continued PT services  PT Problem List Decreased strength;Decreased activity tolerance;Decreased balance;Decreased mobility;Cardiopulmonary status limiting activity;Obesity       PT Treatment Interventions DME instruction;Gait training;Stair training;Functional mobility training;Therapeutic exercise;Therapeutic activities;Balance training;Patient/family education    PT Goals (Current goals can be found in the Care Plan section)  Acute Rehab PT Goals Patient Stated Goal: to get back home to her prior level of independence PT Goal Formulation: With patient Time For Goal Achievement: 07/06/18 Potential to Achieve Goals: Good    Frequency Min 3X/week          AM-PAC PT "6 Clicks" Mobility  Outcome Measure Help needed turning from your back to your side while in a flat bed without using bedrails?: A Little Help needed moving from lying on your back to sitting on the side of a flat bed without using bedrails?: A Little Help needed moving to and from a bed to a chair (including a wheelchair)?: A Little Help needed standing up from a chair using your arms (e.g., wheelchair or bedside chair)?: A Little Help needed to walk in hospital room?: A Little Help needed climbing 3-5 steps with a railing? : A Little 6 Click Score: 18    End of Session Equipment Utilized During Treatment: Oxygen(HFNC 25L ) Activity Tolerance: Patient limited by fatigue Patient left: in chair;with call bell/phone within reach   PT Visit Diagnosis: Difficulty in walking, not elsewhere classified (R26.2)    Time: 2241-1464 PT Time Calculation (min) (ACUTE ONLY): 34 min   Charges:       Wells Guiles B. Monika Chestang, PT, DPT  Acute  Rehabilitation [phone] pager #[phone] office  @ [facility]: [phone]   PT Evaluation $PT Eval Moderate Complexity: 1 Mod PT Treatments $Therapeutic Exercise: 8-22 mins       06/22/2018, 4:58 PM

## 2018-06-23 DIAGNOSIS — I5033 Acute on chronic diastolic (congestive) heart failure: Secondary | ICD-10-CM

## 2018-06-23 LAB — BASIC METABOLIC PANEL
Anion gap: 10 (ref 5–15)
BUN: 18 mg/dL (ref 6–20)
CO2: 36 mmol/L — ABNORMAL HIGH (ref 22–32)
Calcium: 8.9 mg/dL (ref 8.9–10.3)
Chloride: 94 mmol/L — ABNORMAL LOW (ref 98–111)
Creatinine, Ser: 0.78 mg/dL (ref 0.44–1.00)
GFR calc Af Amer: >60 mL/min (ref >60)
GFR calc non Af Amer: >60 mL/min (ref >60)
Glucose, Bld: 103 mg/dL — ABNORMAL HIGH (ref 70–99)
Potassium: 4.3 mmol/L (ref 3.5–5.1)
Sodium: 140 mmol/L (ref 135–145)

## 2018-06-23 LAB — CBC
HCT: 50.2 % — ABNORMAL HIGH (ref 36.0–46.0)
Hemoglobin: 16.1 g/dL — ABNORMAL HIGH (ref 12.0–15.0)
MCH: 29.2 pg (ref 26.0–34.0)
MCHC: 32.1 g/dL (ref 30.0–36.0)
MCV: 90.9 fL (ref 80.0–100.0)
Platelets: 368 10*3/uL (ref 150–400)
RBC: 5.52 MIL/uL — ABNORMAL HIGH (ref 3.87–5.11)
RDW: 18.4 % — ABNORMAL HIGH (ref 11.5–15.5)
WBC: 11.5 10*3/uL — ABNORMAL HIGH (ref 4.0–10.5)
nRBC: 0 % (ref 0.0–0.2)

## 2018-06-23 LAB — GLUCOSE, CAPILLARY
Glucose-Capillary: 107 mg/dL — ABNORMAL HIGH (ref 70–99)
Glucose-Capillary: 153 mg/dL — ABNORMAL HIGH (ref 70–99)
Glucose-Capillary: 116 mg/dL — ABNORMAL HIGH (ref 70–99)

## 2018-06-23 LAB — MAGNESIUM: Magnesium: 2 mg/dL (ref 1.7–2.4)

## 2018-06-23 MED ORDER — FUROSEMIDE 10 MG/ML IJ SOLN
80.0000 mg | Freq: Two times a day (BID) | INTRAMUSCULAR | Status: DC
  Administered 2018-06-23 – 2018-06-24 (×3): 80 mg via INTRAVENOUS
  Filled 2018-06-23 (×4): qty 8

## 2018-06-23 MED ORDER — TREPROSTINIL 100 MG/20ML IJ SOLN
77.0000 ng/kg/min | INTRAVENOUS | Status: DC
  Administered 2018-06-23: 77 ng/kg/min via INTRAVENOUS

## 2018-06-23 NOTE — Progress Notes (Signed)
Advanced Heart Failure Rounding Note   Subjective:    Sleeping on HFNC when I walked in. Sats in mid 37s.   Says she feels ok. No CP, SOB, orthopnea or PND.   Weight down 5 pounds. (11 pounds total). Reports using IS and flutter valve.   Objective:   Weight Range:  Vital Signs:   Temp:  [98 F (36.7 C)-98.7 F (37.1 C)] 98.4 F (36.9 C) (06/19 1149) Pulse Rate:  [87-101] 97 (06/19 1149) Resp:  [20-32] 20 (06/19 1149) BP: (100-139)/(63-84) 100/63 (06/19 1149) SpO2:  [30 %-94 %] 90 % (06/19 1445) FiO2 (%):  [100 %] 100 % (06/19 1445) Last BM Date: 06/20/18  Weight change: Filed Weights   06/19/18 0300 06/21/18 0414 06/22/18 0323  Weight: 122.2 kg 124.2 kg 121.8 kg    Intake/Output:   Intake/Output Summary (Last 24 hours) at 06/23/2018 1558 Last data filed at 06/23/2018 0900 Gross per 24 hour  Intake 718 ml  Output 1200 ml  Net -482 ml     Physical Exam: General:  Lying in bed on HFNC HEENT: normal Neck: supple. JVP hard to see. ? 8-9 Carotids 2+ bilat; no bruits. No lymphadenopathy or thryomegaly appreciated. Cor: PMI nondisplaced. Regular rate & rhythm.2/6 TR Lungs: coarse Abdomen: obese soft, nontender, nondistended. No hepatosplenomegaly. No bruits or masses. Good bowel sounds. Extremities: no cyanosis, clubbing, rash, tr edema. Chronic venous stasis  Neuro: alert & orientedx3, cranial nerves grossly intact. moves all 4 extremities w/o difficulty. Affect pleasant   Telemetry: NSR 90s Personally reviewed   Labs: Basic Metabolic Panel: Recent Labs  Lab 06/19/18 0634 06/20/18 0611 06/21/18 0630 06/22/18 0619 06/23/18 0658  NA 140 141 140 140 140  K 4.2 3.1* 3.5 4.0 4.3  CL 95* 93* 94* 96* 94*  CO2 35* 39* 34* 37* 36*  GLUCOSE 113* 106* 115* 117* 103*  BUN _0 CREATININE 0.65 0.73 0.87 0.77 0.78  CALCIUM 8.2* 8.3* 8.7* 8.7* 8.9  MG 1.7  --   --  1.9 2.0    Liver Function Tests: No results for input(s): AST, ALT, ALKPHOS,  BILITOT, PROT, ALBUMIN in the last 168 hours. No results for input(s): LIPASE, AMYLASE in the last 168 hours. No results for input(s): AMMONIA in the last 168 hours.  CBC: Recent Labs  Lab 06/17/18 0643 06/18/18 1051 06/20/18 0611 06/23/18 0658  WBC 18.2* 11.3* 10.4 11.5*  HGB 15.3* 15.0 15.7* 16.1*  HCT 47.6* 46.4* 49.1* 50.2*  MCV 90.8 90.4 89.9 90.9  PLT 320 302 297 368    Cardiac Enzymes: No results for input(s): CKTOTAL, CKMB, CKMBINDEX, TROPONINI in the last 168 hours.  BNP: BNP (last 3 results) Recent Labs    12/22/17 1247 06/15/18 0744  BNP 47.4 49.0    ProBNP (last 3 results) No results for input(s): PROBNP in the last 8760 hours.    Other results:  Imaging: No results found.   Medications:     Scheduled Medications: . gabapentin  600 mg Oral TID  . insulin aspart  0-9 Units Subcutaneous TID WC  . pantoprazole  20 mg Oral BID  . potassium chloride  40 mEq Oral TID  . pravastatin  20 mg Oral Daily  . rivaroxaban  20 mg Oral Q supper  . sildenafil  20 mg Oral TID  . spironolactone  25 mg Oral Daily  . torsemide  60 mg Oral BID  . vitamin B-12  500 mcg Oral Daily  Infusions: . sodium chloride 250 mL (06/18/18 0916)  . treprostinil (REMODULIN) infusion      PRN Medications: sodium chloride, acetaminophen **OR** acetaminophen, fluticasone, ipratropium-albuterol, ondansetron **OR** ondansetron (ZOFRAN) IV   Assessment/Plan:   1. Acute on chronic hypoxic respiratory failure due to possible CAP - On admit CXR concerning for possible retrocardiac infiltrate. - Blood Cultures 1/2 CNS. Likely contaminant  - Completed azithromycin and ceftriaxone x 7 days  Finished 3 days vanc for cellulitis as well  - Remains on 25L HFNC which is greatly increased from baseline - Continue aggressive pulmonary toilet with flutter valve and IS. I spent a long time again this am educating her on both and showing her technique. Goal IS is 1000 for today. Also needs  PT for mobilization   - Volume status improved but given ongoing hypoxemia I will give her more IV lasix to try to improve oxygenation by getting her as dry as possible. Watch BP and renal function   2. Possible RLE cellulitis -  Resolved. Received vanc x 3 days per IMTS  3. Acute Chronic diastolic HF/cor pulmonale:  - She is drinking a lot of fluids have restricted to 1800cc - See above. Switch lasix to 80 IV bid  4. 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 However PAH far out of proportion to L-sided pressures or restrictive lung disease. - Follows with [facility]. Last seen 10/2017 - Remains on Remodulin. Cannot stop remodulin due to rebound pulmonary hypertensin.  - Restarted sildenafil 6/15  5. Paroxysmal atrial flutter.  -In Sinus - Continue Xarelto.No bleeding   6. Hypokalemia - Continue spiro 25. K 4.3   Length of Stay: 8   Chaka Jefferys MD 06/23/2018, 3:58 PM  Advanced Heart Failure Team Pager [phone] (M-F; 7a - 4p)  Please contact [facility] for night-coverage after hours (4p -7a ) and weekends on [email or web address]

## 2018-06-23 NOTE — Progress Notes (Signed)
Patient was having a Small to moderate amount of bright red blood  from the right nostril . This RN was able to stop the bleeding by applying ice on the nose. Notified Dr. Sharon Seller and she indicated patient will probably be better on the CPAP. No new order received. RT notified, patient is switched from HFNC to CPAP. No acute distress noted. Will continue to monitor.

## 2018-06-23 NOTE — Progress Notes (Signed)
   Subjective: HD#8   Overnight: No acute overnight events  Today, Pennelope A Kohlbeck mentions her breathing has improved and was able to ambulate with physical therapist with use of her walker but states she only walked in her room due to how short her oxygen cords are. She states she would like to be discharged home soon. Discussed difficulty with discharge when she continues to have such high oxygen requirement. She expressed understanding.  Objective:  Vital signs in last 24 hours: Vitals:   06/22/18 2341 06/23/18 0355 06/23/18 0421 06/23/18 0514  BP:   123/73   Pulse: 97 87 92 (!) 101  Resp: (!) 27 (!) 27 (!) 27 (!) 23  Temp:   98 F (36.7 C)   TempSrc:   Oral   SpO2: 91% 94% 92% (!) 30%  Weight:      Height:       Const: In no distress, sitting on recliner Respiratory: Bibasilar crackles- improved from yesterday  Assessment/Plan:  Principal Problem:   CAP (community acquired pneumonia) Active Problems:   Obstructive sleep apnea   Diabetes ([place])   Right heart failure, NYHA class 3 (HCC)   Pulmonary hypertension (HCC)   Chronic diastolic CHF (congestive heart failure) (HCC)   Acute on chronic respiratory failure (HCC)   PAH (pulmonary artery hypertension) ([place])  Ms. Almario is a 58 year old woman with medical history significant for multifactorial pulmonary arterial hypertension, chronic hypoxic respiratory failure on 4 L nasal cannula at home, chronic venous stasis, paroxysmal atrial fibrillation, type 2 diabetes mellitus, obstructive sleep apnea on CPAP, chronic diastolic heart failure who presented with productive cough and rigors found to have possible retrocardiac pulmonary infiltrate for which she has completed a 7-day course of ceftriaxone and azithromycin.  We are currently managing her her pulmonary hypertension.  #Pulmonary hypertension, cor pulmonale #Chronic diastolic heart failure On assessment this morning, she was requiring 35 L of high flow supplemental  oxygen.  Her oxygen saturation was between 90 to 93%.  She reports of feeling better and is compliant with incentive spirometer and flutter valve.  She worked with physical therapy yesterday and due to oxygen demand she was not able to ambulate but did March while sitting down.  We have a long way to go in regards to titrating down her oxygen demand.  This has been complicated with her chronic pulmonary artery hypertension and her new lung insult from community-acquired pneumonia. -Continue Remodulin infusion -Continue torsemide 60 mgBID, spironolactone 25 mg daily, sildenafil -Strict intake and output -Daily weight -Appreciate PT/OT recommendation -Closely monitor electrolytes  #Acute on chronic hypoxic respiratory failure #Community-acquired pneumonia(retrocardiac) -Continue supplemental oxygenand wean as tolerated -Continue pulmonary toilet, incentive spirometry, inhalers -Status post azithromycin(Day7/7),ceftriaxone(Day7/7), Vancomycin(Day3/3)  #Hemoconcentration: Her CBC this morning showed WBC 11.5, hemoglobin 16 which I believe this represents hemoconcentration.  She has remained afebrile and without any signs of systemic infection. -Continue to monitor  #Paroxysmal atrial fibrillation/Flutter -Continue Xarelto  FEN: Replace electrolytes as needed,heart healthy/carb modified diet VTE prophylaxis: Xarelto CODE STATUS: Full code(per chart review, she would not want long-term dependence on ventilator but doesdesirefull scope of care)  Jean Rosenthal, MD 06/23/2018, 6:10 AM Pager: [phone] Internal Medicine Teaching Service

## 2018-06-23 NOTE — Evaluation (Signed)
Occupational Therapy Evaluation Patient Details Name: [name] MRN: [id number] DOB: 1960/10/15 Today's Date: 06/23/2018    History of Present Illness 58 y.o. female admitted on 06/15/18 for chills and productive cough.  Tested for COVID and was (-).  Dx with CAP, pulmonary HTN, cor pulmonale, chronic diastolic heart failure, acute on chronic hypoxic respiratory failure, and PAF.  Pt with other significant PMH of OSA, HTN, baseline mental disability, DM, COPD, anemia.     Clinical Impression   Pt admitted with above. She demonstrates the below listed deficits and will benefit from continued OT to maximize safety and independence with BADLs.  Pt presents to OT with generalized weakness, as well as decreased activity tolerance.  She currently is able to perform ADLs at min A - min guard assist level, however, 02 sats drop to 84% with minimal activity while on 30L hi flo 02.  She requires frequent seated rest breaks to recover to >90% within 30-90 seconds.  Pt is very motivated to improve.  She lives with her son and was fully independent PTA.  Anticipate good progress once pulmonary issues resolved.  Will follow acutely.      Follow Up Recommendations  Home health OT;Supervision/Assistance - 24 hour    Equipment Recommendations  None recommended by OT    Recommendations for Other Services       Precautions / Restrictions Precautions Precautions: Other (comment) Precaution Comments: monitor sats      Mobility Bed Mobility               General bed mobility comments: Pt sitting up in recliner   Transfers Overall transfer level: Needs assistance Equipment used: Rolling walker (2 wheeled);None Transfers: Sit to/from [employer] to Stand: Min guard Stand pivot transfers: Min guard       General transfer comment: min guard assist for safety     Balance Overall balance assessment: Needs assistance Sitting-balance support: Feet supported;No upper  extremity supported;Bilateral upper extremity supported;Single extremity supported Sitting balance-Leahy Scale: Good Sitting balance - Comments: able to bend toward feet without LOB    Standing balance support: No upper extremity supported;Bilateral upper extremity supported;Single extremity supported Standing balance-Leahy Scale: Fair Standing balance comment: Pt able to stand statically with min guard assist, and no UE support                            ADL either performed or assessed with clinical judgement   ADL Overall ADL's : Needs assistance/impaired Eating/Feeding: Independent   Grooming: Wash/dry hands;Wash/dry face;Oral care;Brushing hair;Min guard;Standing   Upper Body Bathing: Set up;Sitting   Lower Body Bathing: Min guard;Sit to/from stand   Upper Body Dressing : Min guard;Sitting   Lower Body Dressing: Min guard;Sit to/from stand   Toilet Transfer: Min guard;Stand-pivot;BSC;RW   Toileting- Water quality scientist and Hygiene: Min guard;Sit to/from stand       Functional mobility during ADLs: Min guard;Rolling walker General ADL Comments: Pt requires frequent rest breaks due desaturation on 30L  hi flo 02     Vision Baseline Vision/History: No visual deficits Patient Visual Report: No change from baseline       Perception     Praxis      Pertinent Vitals/Pain Pain Assessment: No/denies pain     Hand Dominance Right   Extremity/Trunk Assessment Upper Extremity Assessment Upper Extremity Assessment: Generalized weakness   Lower Extremity Assessment Lower Extremity Assessment: Generalized weakness   Cervical / Trunk  Assessment Cervical / Trunk Assessment: Normal   Communication Communication Communication: No difficulties   Cognition Arousal/Alertness: Awake/alert Behavior During Therapy: WFL for tasks assessed/performed Overall Cognitive Status: History of cognitive impairments - at baseline                                      General Comments  02 sats decreased to 84% with activity on 30L  via hi flo cannula.  she recovers in 30-90 seconds with seated rest break and pursed lip breathing     Exercises Exercises: General Lower Extremity;General Upper Extremity General Exercises - Upper Extremity Shoulder Flexion: AROM;Right;Left;10 reps;Seated General Exercises - Lower Extremity Long Arc Quad: AROM;Both;10 reps Hip Flexion/Marching: AROM;Right;Left;Other reps (comment)(1.5 mins ) Other Exercises Other Exercises: Pt instructed in pursed lip breathing    Shoulder Instructions      Home Living Family/patient expects to be discharged to:: Private residence Living Arrangements: Children(son who works) Available Help at Discharge: Family Type of Home: House Home Access: Stairs to enter Technical brewer of Steps: 3 Entrance Stairs-Rails: None(pt reports she uses her rollator backwards to get up/down) Home Layout: One level     Bathroom Shower/Tub: Tub/shower unit;Curtain   Biochemist, clinical: Standard     Home Equipment: Environmental consultant - 4 wheels;Shower seat   Additional Comments: oxygen 4 L O2 [place]      Prior Functioning/Environment Level of Independence: Independent with assistive device(s)        Comments: Pt does not drive.  pt reports she uses the rollator outside of the house, furniture walks inside of the house because it is tight and small.  She cooks her own meals while her son is at work.          OT Problem List: Decreased strength;Decreased activity tolerance;Impaired balance (sitting and/or standing);Decreased knowledge of use of DME or AE;Obesity;Cardiopulmonary status limiting activity      OT Treatment/Interventions: Self-care/ADL training;Therapeutic exercise;DME and/or AE instruction;Therapeutic activities;Patient/family education;Balance training    OT Goals(Current goals can be found in the care plan section) Acute Rehab OT Goals Patient Stated Goal: to get back home to  her prior level of independence OT Goal Formulation: With patient Time For Goal Achievement: 07/07/18 Potential to Achieve Goals: Good ADL Goals Pt Will Perform Grooming: with modified independence;standing Pt Will Perform Upper Body Bathing: with modified independence;sitting Pt Will Perform Lower Body Bathing: with modified independence;sit to/from stand Pt Will Perform Upper Body Dressing: with modified independence;sitting Pt Will Perform Lower Body Dressing: with modified independence;sit to/from stand Pt Will Transfer to Toilet: with modified independence;ambulating;regular height toilet;bedside commode;grab bars Pt Will Perform Toileting - Clothing Manipulation and hygiene: with modified independence;sit to/from stand Pt/caregiver will Perform Home Exercise Program: Increased ROM;Increased strength;With theraband;With written HEP provided;Independently Additional ADL Goal #1: Pt will actively participate in 10 mins therapeutic activity with no more than 3 seated rest breaks and 02 sats >92%  OT Frequency: Min 3X/week   Barriers to D/C:            Co-evaluation              AM-PAC OT "6 Clicks" Daily Activity     Outcome Measure Help from another person eating meals?: None Help from another person taking care of personal grooming?: A Little Help from another person toileting, which includes using toliet, bedpan, or urinal?: A Little Help from another person bathing (including washing, rinsing, drying)?: A Little  Help from another person to put on and taking off regular upper body clothing?: A Little Help from another person to put on and taking off regular lower body clothing?: A Little 6 Click Score: 19   End of Session Equipment Utilized During Treatment: Rolling walker;Oxygen Nurse Communication: Mobility status  Activity Tolerance: Patient limited by fatigue Patient left: in chair;with call bell/phone within reach  OT Visit Diagnosis: Unsteadiness on feet  (R26.81)                Time: 4834-7583 OT Time Calculation (min): 22 min Charges:  OT General Charges $OT Visit: 1 Visit OT Evaluation $OT Eval Moderate Complexity: 1 Mod  Lucille Passy, OTR/L Acute Rehabilitation Services Pager [phone] Office [phone]   Lucille Passy M 06/23/2018, 2:29 PM

## 2018-06-23 NOTE — Progress Notes (Signed)
Patient removed from CPAP per request and placed on heated HFNC of 35L at 100%. Vitals are stable will continue to monitor patient.

## 2018-06-23 NOTE — Progress Notes (Addendum)
Physical Therapy Treatment Patient Details Name: [name] MRN: [id number] DOB: 10/06/1960 Today's Date: 06/23/2018    History of Present Illness 58 y.o. female admitted on 06/15/18 for chills and productive cough.  Tested for COVID and was (-).  Dx with CAP, pulmonary HTN, cor pulmonale, chronic diastolic heart failure, acute on chronic hypoxic respiratory failure, and PAF.  Pt with other significant PMH of OSA, HTN, baseline mental disability, DM, COPD, anemia.      PT Comments    Pt continues to be on high levels of O2 (25L HFNC), but is able to tolerate standing marches in walker (attempting to simulate gait) and seated HEP.  We will continue to follow her acutely to progress gait into the hallway once she is on less supplemental O2.  Her baseline is 4L.  PT will continue to follow acutely for safe mobility progression.  Follow Up Recommendations  Home health PT;Supervision - Intermittent     Equipment Recommendations  None recommended by PT;Other (comment)(interested in getting more O2 tubing.)    Recommendations for Other Services   NA     Precautions / Restrictions Precautions Precautions: Other (comment) Precaution Comments: monitor sats    Mobility  Bed Mobility               General bed mobility comments: Pt sitting up in recliner   Transfers Overall transfer level: Needs assistance Equipment used: Rolling walker (2 wheeled);None Transfers: Sit to/from Stand Sit to Stand: Supervision Stand pivot transfers: Supervision       General transfer comment: supervision for safety.   Ambulation/Gait             General Gait Details: Pt continues to be able to walk further than the length of her HFNC as she is on 25L.  We were able to march in place again with RW for support for 1-2 mins until her sats dropped to 74% and we would sit and rest.  She would rebound in <2 mins.           Balance Overall balance assessment: Needs  assistance Sitting-balance support: Feet supported;No upper extremity supported Sitting balance-Leahy Scale: Good   Standing balance support: No upper extremity supported;Bilateral upper extremity supported;Single extremity supported Standing balance-Leahy Scale: Fair Standing balance comment: able to preform her own peri care with supervision.                             Cognition Arousal/Alertness: Awake/alert Behavior During Therapy: WFL for tasks assessed/performed Overall Cognitive Status: History of cognitive impairments - at baseline                                        Exercises General Exercises - Upper Extremity Shoulder Flexion: AROM;Both;10 reps Elbow Flexion: AROM;Both;10 reps General Exercises - Lower Extremity Long Arc Quad: AROM;Both;10 reps Hip Flexion/Marching: AROM;Both;10 reps Toe Raises: AROM;Both;10 reps Heel Raises: AROM;Both;10 reps Other Exercises Other Exercises: Pt instructed in pursed lip breathing         Pertinent Vitals/Pain Pain Assessment: No/denies pain           PT Goals (current goals can now be found in the care plan section) Acute Rehab PT Goals Patient Stated Goal: to get back home to her prior level of independence Progress towards PT goals: Progressing toward goals    Frequency  Min 3X/week      PT Plan Current plan remains appropriate       AM-PAC PT "6 Clicks" Mobility   Outcome Measure  Help needed turning from your back to your side while in a flat bed without using bedrails?: A Little Help needed moving from lying on your back to sitting on the side of a flat bed without using bedrails?: A Little Help needed moving to and from a bed to a chair (including a wheelchair)?: A Little Help needed standing up from a chair using your arms (e.g., wheelchair or bedside chair)?: None Help needed to walk in hospital room?: A Little Help needed climbing 3-5 steps with a railing? : A Little 6  Click Score: 19    End of Session Equipment Utilized During Treatment: Oxygen(25 L HFNC) Activity Tolerance: Patient limited by fatigue Patient left: in chair;with call bell/phone within reach   PT Visit Diagnosis: Difficulty in walking, not elsewhere classified (R26.2)     Time: 0272-5366 PT Time Calculation (min) (ACUTE ONLY): 23 min  Charges:  $Therapeutic Exercise: 23-37 mins                    Agapita Savarino B. Zeriyah Wain, PT, DPT  Acute Rehabilitation [phone] pager [phone] office  @ [facility]: [phone]   06/23/2018, 5:59 PM

## 2018-06-24 LAB — GLUCOSE, CAPILLARY
Glucose-Capillary: 115 mg/dL — ABNORMAL HIGH (ref 70–99)
Glucose-Capillary: 187 mg/dL — ABNORMAL HIGH (ref 70–99)
Glucose-Capillary: 109 mg/dL — ABNORMAL HIGH (ref 70–99)
Glucose-Capillary: 159 mg/dL — ABNORMAL HIGH (ref 70–99)

## 2018-06-24 LAB — BASIC METABOLIC PANEL
Anion gap: 10 (ref 5–15)
BUN: 16 mg/dL (ref 6–20)
CO2: 31 mmol/L (ref 22–32)
Calcium: 8.9 mg/dL (ref 8.9–10.3)
Chloride: 96 mmol/L — ABNORMAL LOW (ref 98–111)
Creatinine, Ser: 0.67 mg/dL (ref 0.44–1.00)
GFR calc Af Amer: >60 mL/min (ref >60)
GFR calc non Af Amer: >60 mL/min (ref >60)
Glucose, Bld: 112 mg/dL — ABNORMAL HIGH (ref 70–99)
Potassium: 3.7 mmol/L (ref 3.5–5.1)
Sodium: 137 mmol/L (ref 135–145)

## 2018-06-24 NOTE — Progress Notes (Addendum)
Daily Nursing Note  Received report from [place], [place]. Assess patient who requested orange juice. Noted to be on HiFlow [place] ay 25LPM/100% FiO2. Gotten OOB to chair this morning after medicine team rounded. Patient did  well with mobility only requiring stand-by assistance. HA in afternoon remedied with tylenol. Remained in chair for much of the day gotten into bed this evening. Lasix 22m IV BID being administered.   IV infiltrated therefore requested IV team to place a new one given the complexity of patients access. Unfortunately both IV team nurses attempted without success. Medical team informed in the event that IR need to be consulted for line placement.    All needs met during the day.

## 2018-06-24 NOTE — Progress Notes (Signed)
   Subjective: Patient was lying in her bed today stating that she was breathing about the same as the prior day.  She denied acute dyspnea but has not been up and ambulating today.  She understands the progress only occur if she continues to expand to utilize her lungs as much as possible.  Objective:  Vital signs in last 24 hours: Vitals:   06/23/18 1900 06/23/18 1955 06/23/18 2340 06/24/18 0300  BP: 120/72  111/71 112/73  Pulse: 92 93 96 94  Resp: (!) 28 (!) 27 (!) 22 (!) 26  Temp: 98.2 F (36.8 C)  98.6 F (37 C) 98.2 F (36.8 C)  TempSrc: Oral  Oral Axillary  SpO2: 91% 92% 92% 92%  Weight:    118.6 kg  Height:       General: A/O x4, in no acute distress, afebrile, nondiaphoretic Cardio: RRR, no mrg's  Pulmonary: Faint wheezing bilaterally,  Abdomen: Bowel sounds normal, soft, nontender  MSK: BLE nontender, nonedematous Psych: Appropriate affect, not depressed in appearance, engages well  Assessment/Plan:  Principal Problem:   CAP (community acquired pneumonia) Active Problems:   Obstructive sleep apnea   Diabetes ([place])   Right heart failure, NYHA class 3 (HCC)   Pulmonary hypertension (HCC)   Acute on chronic diastolic (congestive) heart failure (HCC)   Acute on chronic respiratory failure (HCC)   PAH (pulmonary artery hypertension) ([place])  Patient encounter summary: [name] is a 58 year old female with a past medical history notable for PAH, CHF, paroxysmal atrial flutter who was admitted for acute on chronic hypoxic respiratory failure secondary to Pneumonia.  Admission has been complicated by her increasing requirement for supplemental oxygen.  Currently weaning supplemental oxygen has been impossible due to persistent hypoxia.  A/P: Acute on chronic hypoxia respiratory failure: Most likely result of CAP.  Improvement is limited by the requirement for high flow oxygen which can be delivered in the room.  Her current supplemental oxygen requirements are 25 LPM  on HFNC.  We have encouraged the patient to utilize spirometer and Acapella device.  This is most likely the result of pneumonia with superimposed deconditioning in addition to her underlying PAH and cor pulmonale.  Patient's weight is down approximately 3 kg from 3 days prior.  Despite high-dose diuretics the patient's net loss was less than 1 L daily.  She completed treatment for the CAP with ceftriaxone x7 days.  -Continue remodeling infusion -Continue furosemide 80 mg IV twice daily -Continue spironolactone 25 mg daily -Continue sildenafil -Continue strict I's and O's -Continue daily weights -Continue to closely monitor her electrolytes  Code: Full Diet: Heart healthy carb modified VTE prophylaxis: Xarelto Dispo: Anticipated discharge in approximately however any days and until medically stable.   Kathi Ludwig, MD 06/24/2018, 7:22 AM Pager: Pager# [phone]

## 2018-06-24 NOTE — Progress Notes (Signed)
Advanced Heart Failure Rounding Note   Subjective:    Sleeping on HFNC when I walked in. Sats 93% (they were in mid 80s yesterday)  IV lasix restarted yesterday in an effort to improve oxygenation. Weight down 7 pounds. Breathing better. No orthopnea or PND. SBP stable. No BMET yet today.   No fevers or chills. No problem with remodulin pump  Objective:   Weight Range:  Vital Signs:   Temp:  [98.2 F (36.8 C)-98.7 F (37.1 C)] 98.2 F (36.8 C) (06/20 0300) Pulse Rate:  [92-97] 94 (06/20 0300) Resp:  [20-28] 26 (06/20 0300) BP: (100-122)/(63-82) 112/73 (06/20 0300) SpO2:  [90 %-94 %] 92 % (06/20 0300) FiO2 (%):  [100 %] 100 % (06/19 1955) Weight:  [118.6 kg] 118.6 kg (06/20 0300) Last BM Date: 06/20/18  Weight change: Filed Weights   06/21/18 0414 06/22/18 0323 06/24/18 0300  Weight: 124.2 kg 121.8 kg 118.6 kg    Intake/Output:   Intake/Output Summary (Last 24 hours) at 06/24/2018 0821 Last data filed at 06/24/2018 0700 Gross per 24 hour  Intake 598 ml  Output 1650 ml  Net -1052 ml     Physical Exam: General:  Lying in bed on HFNC HEENT: normal Neck: supple. no JVP hard to see. Doesn't look overly elevated. Carotids 2+ bilat; no bruits. No lymphadenopathy or thryomegaly appreciated. Cor: PMI nondisplaced. Regular rate & rhythm. No rubs, gallops or murmurs. Lungs: clear Abdomen: obese soft, nontender, nondistended. No hepatosplenomegaly. No bruits or masses. Good bowel sounds. Extremities: no cyanosis, clubbing, rash, trace edema  Chronic venous stasis changes Neuro: alert & orientedx3, cranial nerves grossly intact. moves all 4 extremities w/o difficulty. Affect pleasant   Telemetry: NSR 90s Personally reviewed   Labs: Basic Metabolic Panel: Recent Labs  Lab 06/19/18 0634 06/20/18 0611 06/21/18 0630 06/22/18 0619 06/23/18 0658  NA 140 141 140 140 140  K 4.2 3.1* 3.5 4.0 4.3  CL 95* 93* 94* 96* 94*  CO2 35* 39* 34* 37* 36*  GLUCOSE 113* 106*  115* 117* 103*  BUN _0 CREATININE 0.65 0.73 0.87 0.77 0.78  CALCIUM 8.2* 8.3* 8.7* 8.7* 8.9  MG 1.7  --   --  1.9 2.0    Liver Function Tests: No results for input(s): AST, ALT, ALKPHOS, BILITOT, PROT, ALBUMIN in the last 168 hours. No results for input(s): LIPASE, AMYLASE in the last 168 hours. No results for input(s): AMMONIA in the last 168 hours.  CBC: Recent Labs  Lab 06/18/18 1051 06/20/18 0611 06/23/18 0658  WBC 11.3* 10.4 11.5*  HGB 15.0 15.7* 16.1*  HCT 46.4* 49.1* 50.2*  MCV 90.4 89.9 90.9  PLT 302 297 368    Cardiac Enzymes: No results for input(s): CKTOTAL, CKMB, CKMBINDEX, TROPONINI in the last 168 hours.  BNP: BNP (last 3 results) Recent Labs    12/22/17 1247 06/15/18 0744  BNP 47.4 49.0    ProBNP (last 3 results) No results for input(s): PROBNP in the last 8760 hours.    Other results:  Imaging: No results found.   Medications:     Scheduled Medications: . furosemide  80 mg Intravenous BID  . gabapentin  600 mg Oral TID  . insulin aspart  0-9 Units Subcutaneous TID WC  . pantoprazole  20 mg Oral BID  . potassium chloride  40 mEq Oral TID  . pravastatin  20 mg Oral Daily  . rivaroxaban  20 mg Oral Q supper  . sildenafil  20  mg Oral TID  . spironolactone  25 mg Oral Daily  . vitamin B-12  500 mcg Oral Daily    Infusions: . sodium chloride 250 mL (06/18/18 0916)  . treprostinil (REMODULIN) infusion 77 ng/kg/min (06/23/18 1749)    PRN Medications: sodium chloride, acetaminophen **OR** acetaminophen, fluticasone, ipratropium-albuterol, ondansetron **OR** ondansetron (ZOFRAN) IV   Assessment/Plan:   1. Acute on chronic hypoxic respiratory failure due to possible CAP - On admit CXR concerning for possible retrocardiac infiltrate.  - Completed azithromycin and ceftriaxone x 7 days  Finished 3 days vanc for cellulitis as well  - Remains on HFNC which is increased from baseline - Sats seemed improved with aggressive  diuresis. Will give 1 more dose IV lasix today and await BMET before deciding if she will get another dose this pm - Continue aggressive pulmonary toilet with flutter valve and IS.  2. Possible RLE cellulitis -  Resolved. Received vanc x 3 days per IMTS  3. Acute Chronic diastolic HF/cor pulmonale:  - See discussion above  4. 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 However PAH far out of proportion to L-sided pressures or restrictive lung disease. - Follows with [facility]. Last seen 10/2017 - Remains on Remodulin. Cannot stop remodulin due to rebound pulmonary hypertensin.  - Restarted sildenafil 6/15  5. Paroxysmal atrial flutter.  -In Sinus - Continue Xarelto.No bleeding   6. Hypokalemia - Continue spiro 25. K 4.3 - BMET ordered    Length of Stay: 9   Glori Bickers MD 06/24/2018, 8:21 AM  Advanced Heart Failure Team Pager [phone] (M-F; 7a - 4p)  Please contact [facility] for night-coverage after hours (4p -7a ) and weekends on [email or web address]

## 2018-06-25 ENCOUNTER — Inpatient Hospital Stay (HOSPITAL_COMMUNITY): Payer: Medicaid Other

## 2018-06-25 LAB — BASIC METABOLIC PANEL
Anion gap: 11 (ref 5–15)
BUN: 15 mg/dL (ref 6–20)
CO2: 31 mmol/L (ref 22–32)
Calcium: 9.1 mg/dL (ref 8.9–10.3)
Chloride: 97 mmol/L — ABNORMAL LOW (ref 98–111)
Creatinine, Ser: 0.74 mg/dL (ref 0.44–1.00)
GFR calc Af Amer: >60 mL/min (ref >60)
GFR calc non Af Amer: >60 mL/min (ref >60)
Glucose, Bld: 117 mg/dL — ABNORMAL HIGH (ref 70–99)
Potassium: 4.1 mmol/L (ref 3.5–5.1)
Sodium: 139 mmol/L (ref 135–145)

## 2018-06-25 LAB — GLUCOSE, CAPILLARY
Glucose-Capillary: 118 mg/dL — ABNORMAL HIGH (ref 70–99)
Glucose-Capillary: 147 mg/dL — ABNORMAL HIGH (ref 70–99)
Glucose-Capillary: 116 mg/dL — ABNORMAL HIGH (ref 70–99)
Glucose-Capillary: 144 mg/dL — ABNORMAL HIGH (ref 70–99)

## 2018-06-25 MED ORDER — TORSEMIDE 20 MG PO TABS
60.0000 mg | ORAL_TABLET | Freq: Two times a day (BID) | ORAL | Status: DC
  Administered 2018-06-25 – 2018-07-02 (×15): 60 mg via ORAL
  Filled 2018-06-25 (×15): qty 3

## 2018-06-25 NOTE — Progress Notes (Signed)
Switched patient from CPAP back to Heated High Flow [place].  No distress noted. Sats at 94%.

## 2018-06-25 NOTE — Progress Notes (Signed)
Consult was placed to insert midline,  as 2 IV Team RNs had attempted with ultrasound yesterday to place a Peripheral iv -  for Lasix -  without success;   Right upper arm prepped for midline placement however the vein constricted and was no longer visible with ultrasound; a 20ga 1.88" iv was placed in the Left upper arm instead;  Pt tolerated procedure well.  RN aware of new access in the left upper arm.

## 2018-06-25 NOTE — Progress Notes (Signed)
Advanced Heart Failure Rounding Note   Subjective:    Has been on IV lasix for past 2 days. Weight up 2 pounds today.   Remains on HFNC at [place] in low 90s. Goes down to mid 80s when I turn it down to 20L   Feels ok. Denies SOB. No orthopnea or PND. Not very active,. Afebrile   Objective:   Weight Range:  Vital Signs:   Temp:  [97.5 F (36.4 C)-98.6 F (37 C)] 97.6 F (36.4 C) (06/21 0800) Pulse Rate:  [88-97] 91 (06/21 0655) Resp:  [19-27] 19 (06/21 0655) BP: (110-125)/(62-81) 125/62 (06/21 0655) SpO2:  [90 %-95 %] 93 % (06/21 0655) FiO2 (%):  [100 %] 100 % (06/21 0655) Weight:  [119.5 kg] 119.5 kg (06/21 0500) Last BM Date: 06/24/18  Weight change: Filed Weights   06/22/18 0323 06/24/18 0300 06/25/18 0500  Weight: 121.8 kg 118.6 kg 119.5 kg    Intake/Output:   Intake/Output Summary (Last 24 hours) at 06/25/2018 0918 Last data filed at 06/24/2018 2308 Gross per 24 hour  Intake -  Output 1100 ml  Net -1100 ml     Physical Exam: General:  Lying in bed on HFNC HEENT: normal Neck: supple. no JVD. Carotids 2+ bilat; no bruits. No lymphadenopathy or thryomegaly appreciated. Cor: PMI nondisplaced. Regular rate & rhythm. No rubs, gallops or murmurs. Lungs: crackles in both bases Abdomen: obese soft, nontender, nondistended. No hepatosplenomegaly. No bruits or masses. Good bowel sounds. Extremities: no cyanosis, clubbing, rash, edema  + venous stasis changes Neuro: alert & orientedx3, cranial nerves grossly intact. moves all 4 extremities w/o difficulty. Affect pleasant    Telemetry: NSR 90-100 Personally reviewed   Labs: Basic Metabolic Panel: Recent Labs  Lab 06/19/18 0634 06/20/18 0611 06/21/18 0630 06/22/18 0619 06/23/18 0658 06/24/18 0836  NA 140 141 140 140 140 137  K 4.2 3.1* 3.5 4.0 4.3 3.7  CL 95* 93* 94* 96* 94* 96*  CO2 35* 39* 34* 37* 36* 31  GLUCOSE 113* 106* 115* 117* 103* 112*  BUN _0 CREATININE 0.65 0.73 0.87 0.77  0.78 0.67  CALCIUM 8.2* 8.3* 8.7* 8.7* 8.9 8.9  MG 1.7  --   --  1.9 2.0  --     Liver Function Tests: No results for input(s): AST, ALT, ALKPHOS, BILITOT, PROT, ALBUMIN in the last 168 hours. No results for input(s): LIPASE, AMYLASE in the last 168 hours. No results for input(s): AMMONIA in the last 168 hours.  CBC: Recent Labs  Lab 06/18/18 1051 06/20/18 0611 06/23/18 0658  WBC 11.3* 10.4 11.5*  HGB 15.0 15.7* 16.1*  HCT 46.4* 49.1* 50.2*  MCV 90.4 89.9 90.9  PLT 302 297 368    Cardiac Enzymes: No results for input(s): CKTOTAL, CKMB, CKMBINDEX, TROPONINI in the last 168 hours.  BNP: BNP (last 3 results) Recent Labs    12/22/17 1247 06/15/18 0744  BNP 47.4 49.0    ProBNP (last 3 results) No results for input(s): PROBNP in the last 8760 hours.    Other results:  Imaging: No results found.   Medications:     Scheduled Medications: . furosemide  80 mg Intravenous BID  . gabapentin  600 mg Oral TID  . insulin aspart  0-9 Units Subcutaneous TID WC  . pantoprazole  20 mg Oral BID  . potassium chloride  40 mEq Oral TID  . pravastatin  20 mg Oral Daily  . rivaroxaban  20 mg Oral  Q supper  . sildenafil  20 mg Oral TID  . spironolactone  25 mg Oral Daily  . vitamin B-12  500 mcg Oral Daily    Infusions: . sodium chloride 250 mL (06/18/18 0916)  . treprostinil (REMODULIN) infusion 77 ng/kg/min (06/23/18 1749)    PRN Medications: sodium chloride, acetaminophen **OR** acetaminophen, fluticasone, ipratropium-albuterol, ondansetron **OR** ondansetron (ZOFRAN) IV   Assessment/Plan:   1. Acute on chronic hypoxic respiratory failure due to possible CAP - On admit CXR concerning for possible retrocardiac infiltrate.  - Completed azithromycin and ceftriaxone x 7 days  Finished 3 days vanc for cellulitis as well  - Remains on HFNC which is increased from baseline despite aggressive diuresis - I think her obesity and passive atelectasis are major issues here.  Spent another 15 minutes educating her on need for IS and flutter valve and we reviewed technique, Still has trouble getting over 500 with IS - Continue aggressive pulmonary toilet with flutter valve and IS. Need to ambulate - Consider having Pulmonary evalaute for other suggestions if we cannot wean off HFNC - Switch back to torsemide 60 bid  2. Possible RLE cellulitis -  Resolved. Received vanc x 3 days per IMTS  3. Acute Chronic diastolic HF/cor pulmonale:  - See discussion above - BMET pending this am (will likely need midline PICC given loss of IV access)   4. 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 However PAH far out of proportion to L-sided pressures or restrictive lung disease. - Follows with [facility]. Last seen 10/2017 - Remains on Remodulin. Cannot stop remodulin due to rebound pulmonary hypertensin.  - Restarted sildenafil 6/15  5. Paroxysmal atrial flutter.  -In Sinus - Continue Xarelto.No bleeding   6. Hypokalemia - Continue spiro 25. - BMET this am    Length of Stay: 10   Glori Bickers MD 06/25/2018, 9:18 AM  Advanced Heart Failure Team Pager [phone] (M-F; [place])  Please contact [facility] for night-coverage after hours (4p -7a ) and weekends on [email or web address]

## 2018-06-25 NOTE — Progress Notes (Signed)
Occupational Therapy Treatment Patient Details Name: [name] MRN: [id number] DOB: 11/07/1960 Today's Date: 06/25/2018    History of present illness 58 y.o. female admitted on 06/15/18 for chills and productive cough.  Tested for COVID and was (-).  Dx with CAP, pulmonary HTN, cor pulmonale, chronic diastolic heart failure, acute on chronic hypoxic respiratory failure, and PAF.  Pt with other significant PMH of OSA, HTN, baseline mental disability, DM, COPD, anemia.     OT comments  Pt progressing towards acute OT goals. Focus of session was building activity tolerance in standing towards ADL goals. Pt completed dynamic standing activity with 3 seated rest breaks. O2 sats dropping to mid 70s during each activity (DOE 3/4), recovered within a minute with seated rest and basic breathing exercises. On 25L HLNC this session. Pt motivated to work with therapy. D/c plan remains appropriate.      Follow Up Recommendations  Home health OT;Supervision/Assistance - 24 hour    Equipment Recommendations  None recommended by OT    Recommendations for Other Services      Precautions / Restrictions Precautions Precautions: Other (comment) Precaution Comments: monitor sats Restrictions Weight Bearing Restrictions: No       Mobility Bed Mobility Overal bed mobility: Needs Assistance Bed Mobility: Supine to Sit;Sit to Supine     Supine to sit: Min guard Sit to supine: Min guard      Transfers Overall transfer level: Needs assistance Equipment used: Rolling walker (2 wheeled) Transfers: Sit to/from Stand Sit to Stand: Supervision         General transfer comment: supervision for safety.     Balance Overall balance assessment: Needs assistance Sitting-balance support: Feet supported;No upper extremity supported Sitting balance-Leahy Scale: Good     Standing balance support: No upper extremity supported;Bilateral upper extremity supported;Single extremity supported Standing  balance-Leahy Scale: Fair                             ADL either performed or assessed with clinical judgement   ADL Overall ADL's : Needs assistance/impaired                                     Functional mobility during ADLs: Min guard;Rolling walker General ADL Comments: Pt requires frequent rest breaks due desaturation on 25L hi flo 02     Vision       Perception     Praxis      Cognition Arousal/Alertness: Awake/alert Behavior During Therapy: WFL for tasks assessed/performed Overall Cognitive Status: History of cognitive impairments - at baseline                                          Exercises     Shoulder Instructions       General Comments      Pertinent Vitals/ Pain       Pain Assessment: No/denies pain  Home Living                                          Prior Functioning/Environment              Frequency  Min 3X/week  Progress Toward Goals  OT Goals(current goals can now be found in the care plan section)  Progress towards OT goals: Progressing toward goals  Acute Rehab OT Goals Patient Stated Goal: to get back home to her prior level of independence OT Goal Formulation: With patient Time For Goal Achievement: 07/07/18 Potential to Achieve Goals: Good ADL Goals Pt Will Perform Grooming: with modified independence;standing Pt Will Perform Upper Body Bathing: with modified independence;sitting Pt Will Perform Lower Body Bathing: with modified independence;sit to/from stand Pt Will Perform Upper Body Dressing: with modified independence;sitting Pt Will Perform Lower Body Dressing: with modified independence;sit to/from stand Pt Will Transfer to Toilet: with modified independence;ambulating;regular height toilet;bedside commode;grab bars Pt Will Perform Toileting - Clothing Manipulation and hygiene: with modified independence;sit to/from stand Pt/caregiver will  Perform Home Exercise Program: Increased ROM;Increased strength;With theraband;With written HEP provided;Independently Additional ADL Goal #1: Pt will actively participate in 10 mins therapeutic activity with no more than 3 seated rest breaks and 02 sats >92%  Plan Discharge plan remains appropriate    Co-evaluation                 AM-PAC OT "6 Clicks" Daily Activity     Outcome Measure   Help from another person eating meals?: None Help from another person taking care of personal grooming?: A Little Help from another person toileting, which includes using toliet, bedpan, or urinal?: A Little Help from another person bathing (including washing, rinsing, drying)?: A Little Help from another person to put on and taking off regular upper body clothing?: A Little Help from another person to put on and taking off regular lower body clothing?: A Little 6 Click Score: 19    End of Session Equipment Utilized During Treatment: Rolling walker;Oxygen  OT Visit Diagnosis: Unsteadiness on feet (R26.81)   Activity Tolerance Patient limited by fatigue;Other (comment)(O2 sats drop with activity)   Patient Left in bed;with call bell/phone within reach;with bed alarm set   Nurse Communication          Time: 4562-5638 OT Time Calculation (min): 16 min  Charges: OT General Charges $OT Visit: 1 Visit OT Treatments $Self Care/Home Management : 8-22 mins  Tyrone Schimke, OT Acute Rehabilitation Services Pager: [phone] Office: [phone]    Hortencia Pilar 06/25/2018, 3:58 PM

## 2018-06-25 NOTE — Progress Notes (Signed)
OT Cancellation Note  Patient Details Name: [name] MRN: [id number] DOB: 1960-03-08   Cancelled Treatment:    Reason Eval/Treat Not Completed: Patient at procedure or test/ unavailable. Plan to reattempt as able.  Tyrone Schimke, OT Acute Rehabilitation Services Pager: [phone] Office: [phone]  06/25/2018, 12:33 PM

## 2018-06-25 NOTE — Progress Notes (Signed)
   Subjective: HD#10   Overnight: Placed on CPAP  Today, [name] was examined with Dr. Haroldine Laws at bedside.  She denied dyspnea or worsening shortness of breath.  She demonstrated how she uses the incentive spirometer and flutter valve.  She does have some cough with a flutter valve and in regards to the incentive spirometer, she only reaches 500.  She has been working with physical therapy and though limited by high flow nasal cannula she tolerate standing marches in the walker.  Objective:  Vital signs in last 24 hours: Vitals:   06/24/18 2055 06/24/18 2300 06/25/18 0010 06/25/18 0300  BP: 111/68 111/66 110/63 110/63  Pulse: 95 94 93 88  Resp: (!) 26 19 (!) 27 (!) 22  Temp:  98 F (36.7 C)  (!) 97.5 F (36.4 C)  TempSrc:  Oral  Axillary  SpO2: 93% 91% 93% 95%  Weight:      Height:       Const: In no apparent distress, lying comfortably in bed, conversational HEENT: Atraumatic, normocephalic Resp: Bibasilar crackles CV: RRR, no murmurs, gallop, rub Ext: No lower extremity edema   Assessment/Plan:  Principal Problem:   CAP (community acquired pneumonia) Active Problems:   Obstructive sleep apnea   Diabetes ([place])   Right heart failure, NYHA class 3 (HCC)   Pulmonary hypertension (HCC)   Acute on chronic diastolic (congestive) heart failure (HCC)   Acute on chronic respiratory failure (HCC)   PAH (pulmonary artery hypertension) (HCC)  [name] is a 58 year old female with a past medical history notable for PAH, CHF, paroxysmal atrial flutter who was admitted for acute on chronic hypoxic respiratory failure secondary to Pneumonia.  Admission has been complicated by her increasing requirement for supplemental oxygen.  Currently weaning supplemental oxygen has been impossible due to persistent hypoxia.  A/P: #Acute on chronic hypoxia respiratory failure #Pulmonary hypertension, cor pulmonale #Chronic diastolic heart failure #Community-acquired  pneumonia(retrocardiac) During assessment this morning, his supplemental oxygen was decreased to 20 L and she was noted to have desatted to 80s.  She was placed on CPAP overnight and transition to high flow nasal cannula 30L this morning.  She is diuresing well, urine output over the past 24 hours of 1.6 L.  Overall she is net -1.1 L.  Her weight has been significantly trending down however it was up about 2 pounds overnight.  On physical exams, she still has persistent bibasilar crackles even with aggressive diuresis.  She has no lower extremity edema.  Her ability to use the incentive spirometer and flutter valve is poor and limited by cough.  Per my discussion with Dr. Haroldine Laws in the room, we will involve pulmonology if her symptoms persist and unable to wean her to home supplemental oxygen of 4 L.  She lost IV access and nursing staff was unable to replace it.  I have placed a consult for a midline placement and will await BMP. -Continue remodeling infusion -Continue furosemide 80 mg IV twice daily -Continue spironolactone 25 mg daily -Continue sildenafil -Continue strict I's and O's -Continue daily weights -Continue to closely monitor her electrolytes  Code: Full Diet: Heart healthy carb modified VTE prophylaxis: Xarelto Dispo: Anticipated discharge in approximately however any days and until medically stable.   Jean Rosenthal, MD 06/25/2018, 6:18 AM Pager: [phone] Internal Medicine Teaching Service

## 2018-06-25 NOTE — Progress Notes (Signed)
Have taken patient off Heated [place] for the night and placed on CPAP.

## 2018-06-26 DIAGNOSIS — J9811 Atelectasis: Secondary | ICD-10-CM

## 2018-06-26 DIAGNOSIS — G4733 Obstructive sleep apnea (adult) (pediatric): Secondary | ICD-10-CM

## 2018-06-26 LAB — GLUCOSE, CAPILLARY
Glucose-Capillary: 93 mg/dL (ref 70–99)
Glucose-Capillary: 105 mg/dL — ABNORMAL HIGH (ref 70–99)
Glucose-Capillary: 100 mg/dL — ABNORMAL HIGH (ref 70–99)
Glucose-Capillary: 127 mg/dL — ABNORMAL HIGH (ref 70–99)
Glucose-Capillary: 134 mg/dL — ABNORMAL HIGH (ref 70–99)

## 2018-06-26 LAB — C-REACTIVE PROTEIN: CRP: 6.8 mg/dL — ABNORMAL HIGH (ref <1.0)

## 2018-06-26 LAB — BASIC METABOLIC PANEL
Anion gap: 11 (ref 5–15)
BUN: 16 mg/dL (ref 6–20)
CO2: 33 mmol/L — ABNORMAL HIGH (ref 22–32)
Calcium: 9 mg/dL (ref 8.9–10.3)
Chloride: 96 mmol/L — ABNORMAL LOW (ref 98–111)
Creatinine, Ser: 0.76 mg/dL (ref 0.44–1.00)
GFR calc Af Amer: >60 mL/min (ref >60)
GFR calc non Af Amer: >60 mL/min (ref >60)
Glucose, Bld: 123 mg/dL — ABNORMAL HIGH (ref 70–99)
Potassium: 4.1 mmol/L (ref 3.5–5.1)
Sodium: 140 mmol/L (ref 135–145)

## 2018-06-26 LAB — SEDIMENTATION RATE: Sed Rate: 15 mm/hr (ref 0–22)

## 2018-06-26 LAB — CBC
HCT: 49.8 % — ABNORMAL HIGH (ref 36.0–46.0)
Hemoglobin: 15.7 g/dL — ABNORMAL HIGH (ref 12.0–15.0)
MCH: 28.9 pg (ref 26.0–34.0)
MCHC: 31.5 g/dL (ref 30.0–36.0)
MCV: 91.5 fL (ref 80.0–100.0)
Platelets: 416 10*3/uL — ABNORMAL HIGH (ref 150–400)
RBC: 5.44 MIL/uL — ABNORMAL HIGH (ref 3.87–5.11)
RDW: 18.1 % — ABNORMAL HIGH (ref 11.5–15.5)
WBC: 10.8 10*3/uL — ABNORMAL HIGH (ref 4.0–10.5)
nRBC: 0 % (ref 0.0–0.2)

## 2018-06-26 LAB — PROCALCITONIN: Procalcitonin: <0.1 ng/mL

## 2018-06-26 MED ORDER — IPRATROPIUM-ALBUTEROL 0.5-2.5 (3) MG/3ML IN SOLN
3.0000 mL | Freq: Three times a day (TID) | RESPIRATORY_TRACT | Status: DC
  Administered 2018-06-26 – 2018-07-03 (×21): 3 mL via RESPIRATORY_TRACT
  Filled 2018-06-26 (×22): qty 3

## 2018-06-26 MED ORDER — ALBUTEROL SULFATE (2.5 MG/3ML) 0.083% IN NEBU: 2.5000 mg | INHALATION_SOLUTION | RESPIRATORY_TRACT | Status: DC | PRN

## 2018-06-26 NOTE — H&P (View-Only) (Signed)
Advanced Heart Failure Rounding Note   Subjective:    Remains on HFNC at 30L sats in low 90s. CT scan of chest reviewed yesterday with extensive bibasilar consolidation/atelectasis  Denies SOB, orthopnea or PND. Doesn't seem to be using IS or flutter valve that much. Has diuresed 22 pounds.   Objective:   Weight Range:  Vital Signs:   Temp:  [98.2 F (36.8 C)-98.8 F (37.1 C)] 98.2 F (36.8 C) (06/22 0300) Pulse Rate:  [85-98] 91 (06/22 0748) Resp:  [19-28] 22 (06/22 0748) BP: (101-133)/(67-86) 118/69 (06/22 0748) SpO2:  [88 %-93 %] 90 % (06/22 0748) FiO2 (%):  [100 %] 100 % (06/22 0748) Weight:  [116.6 kg] 116.6 kg (06/22 0500) Last BM Date: 06/24/18  Weight change: Filed Weights   06/24/18 0300 06/25/18 0500 06/26/18 0500  Weight: 118.6 kg 119.5 kg 116.6 kg    Intake/Output:   Intake/Output Summary (Last 24 hours) at 06/26/2018 0828 Last data filed at 06/25/2018 2100 Gross per 24 hour  Intake --  Output 800 ml  Net -800 ml     Physical Exam: General:  Lying in bed on HFNC HEENT: normal Neck: supple. JVP hard to see. Carotids 2+ bilat; no bruits. No lymphadenopathy or thryomegaly appreciated. Cor: PMI nondisplaced. Regular rate & rhythm. 2/6 TR Lungs: clear with crackles in bases Abdomen: obese soft, nontender, nondistended. No hepatosplenomegaly. No bruits or masses. Good bowel sounds. Extremities: no cyanosis, clubbing, rash, edema. Chronic venous stasis changes Neuro: alert & orientedx3, cranial nerves grossly intact. moves all 4 extremities w/o difficulty. Affect pleasant    Telemetry: NSR 90s Personally reviewed   Labs: Basic Metabolic Panel: Recent Labs  Lab 06/21/18 0630 06/22/18 0619 06/23/18 0658 06/24/18 0836 06/25/18 0829  NA 140 140 140 137 139  K 3.5 4.0 4.3 3.7 4.1  CL 94* 96* 94* 96* 97*  CO2 34* 37* 36* 31 31  GLUCOSE 115* 117* 103* 112* 117*  BUN _0 CREATININE 0.87 0.77 0.78 0.67 0.74  CALCIUM 8.7* 8.7* 8.9 8.9  9.1  MG  --  1.9 2.0  --   --     Liver Function Tests: No results for input(s): AST, ALT, ALKPHOS, BILITOT, PROT, ALBUMIN in the last 168 hours. No results for input(s): LIPASE, AMYLASE in the last 168 hours. No results for input(s): AMMONIA in the last 168 hours.  CBC: Recent Labs  Lab 06/20/18 0611 06/23/18 0658  WBC 10.4 11.5*  HGB 15.7* 16.1*  HCT 49.1* 50.2*  MCV 89.9 90.9  PLT 297 368    Cardiac Enzymes: No results for input(s): CKTOTAL, CKMB, CKMBINDEX, TROPONINI in the last 168 hours.  BNP: BNP (last 3 results) Recent Labs    12/22/17 1247 06/15/18 0744  BNP 47.4 49.0    ProBNP (last 3 results) No results for input(s): PROBNP in the last 8760 hours.    Other results:  Imaging: Ct Chest Wo Contrast  Result Date: 06/25/2018 CLINICAL DATA:  Shortness of breath. EXAM: CT CHEST WITHOUT CONTRAST TECHNIQUE: Multidetector CT imaging of the chest was performed following the standard protocol without IV contrast. COMPARISON:  Chest radiograph 06/19/2018 FINDINGS: Cardiovascular: Right internal jugular approach central venous catheter terminates in the distal superior vena cava. The heart is enlarged. There is a pericardial effusion with maximum thickness of 9 mm at the base of the heart. Calcific atherosclerotic disease of the coronary arteries and aorta. Enlargement of the main pulmonary trunk, evidence of pulmonary arterial hypertension. Mediastinum/Nodes: Numerous  enlarged mediastinal lymph nodes with maximum short axis measurement of 18 mm. 2.1 cm soft tissue nodule within the isthmus of the thyroid gland. Lungs/Pleura: The major airways are patent. Patchy areas of airspace consolidation with lower lobe dependent predominance. Additional ground-glass opacities throughout the upper lung fields. Upper Abdomen: No acute abnormality. Musculoskeletal: Findings of diffuse idiopathic skeletal hyperostosis of the thoracic spine. IMPRESSION: 1. Confluent airspace consolidation in  the dependent portions of the lower lobes may represent multifocal pneumonia or subsegmental atelectasis. Additional scattered ground-glass opacities throughout the lung parenchyma, right greater than left, may represent asymmetric pulmonary edema or infectious consolidation. 2. Enlarged heart with small pericardial effusion. 3. Enlarged main pulmonary trunk, suggestive of pulmonary arterial hypertension. 4. 9 mm soft tissue nodule within the isthmus of the thyroid gland. Further evaluation with focused ultrasound may be considered. 5. Calcific atherosclerotic disease of the coronary arteries. Aortic Atherosclerosis (ICD10-I70.0). Electronically Signed   By: Fidela Salisbury M.D.   On: 06/25/2018 14:09     Medications:     Scheduled Medications:  gabapentin  600 mg Oral TID   insulin aspart  0-9 Units Subcutaneous TID WC   pantoprazole  20 mg Oral BID   potassium chloride  40 mEq Oral TID   pravastatin  20 mg Oral Daily   rivaroxaban  20 mg Oral Q supper   sildenafil  20 mg Oral TID   spironolactone  25 mg Oral Daily   torsemide  60 mg Oral BID   vitamin B-12  500 mcg Oral Daily    Infusions:  sodium chloride 250 mL (06/18/18 0916)   treprostinil (REMODULIN) infusion 77 ng/kg/min (06/23/18 1749)    PRN Medications: sodium chloride, acetaminophen **OR** acetaminophen, fluticasone, ipratropium-albuterol, ondansetron **OR** ondansetron (ZOFRAN) IV   Assessment/Plan:   1. Acute on chronic hypoxic respiratory failure due to possible CAP - On admit CXR concerning for possible retrocardiac infiltrate. Completed azithromycin and ceftriaxone x 7 days  Finished 3 days vanc for cellulitis as well  - Remains on HFNC which is increased from baseline despite aggressive diuresis - CT chest confirms severe bibasilar atelectasis/consolidation - I think her obesity and passive atelectasis are major issues here. Needs to use IS/flutter valve as instructed. Needs to get out of bed!!! -  Consider having Pulmonary evalaute for other suggestions if we cannot wean off HFNC - Continue torsemide 60 bid - I think volume status about as good as we can get it.  2. Possible RLE cellulitis -  Resolved. Received vanc x 3 days per IMTS  3. Acute Chronic diastolic HF/cor pulmonale:  - See discussion above  - Continue torsemide 60 bid - I think volume status about as good as we can get it. (was on torsemide 40 bid PTA) - need BMET this am  4. 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 However PAH far out of proportion to L-sided pressures or restrictive lung disease. - Follows with [facility]. Last seen 10/2017 - Remains on Remodulin & sildenafil  5. Paroxysmal atrial flutter.  -In Sinus - Continue Xarelto.No bleeding   6. Hypokalemia - Continue spiro 25. - BMET pending this am   Length of Stay: 11   Glori Bickers MD 06/26/2018, 8:28 AM  Advanced Heart Failure Team Pager [phone] (M-F; [place])  Please contact [facility] for night-coverage after hours (4p -7a ) and weekends on [email or web address]

## 2018-06-26 NOTE — Progress Notes (Signed)
Physical Therapy Treatment Patient Details Name: [name] MRN: [id number] DOB: 02/13/1960 Today's Date: 06/26/2018    History of Present Illness 58 y.o. female admitted on 06/15/18 for chills and productive cough.  Tested for COVID and was (-).  Dx with CAP, pulmonary HTN, cor pulmonale, chronic diastolic heart failure, acute on chronic hypoxic respiratory failure, and PAF.  Pt with other significant PMH of OSA, HTN, baseline mental disability, DM, COPD, anemia.      PT Comments    Assisted pt OOB to [facility] and then recliner.  Pt tolerated longer boughts of standing marches x 2 rounds ~3 mins each today with O2 sats not dropping as low (80-85% on 30 L HFNC vs 74% on 25 % HFNC last session), she fatigued before her sats dropped into the 70s.  Recovery time was 2 mins or less to come back to 90% with seated rest.  We practiced seated UE/LE exercises and IS and flutter valve.  PT will continue to follow acutely for safe mobility progression   Follow Up Recommendations  Home health PT;Supervision - Intermittent     Equipment Recommendations  Other (comment)(interested in getting more O2 tubing for home)    Recommendations for Other Services   NA     Precautions / Restrictions Precautions Precautions: Other (comment) Precaution Comments: monitor sats    Mobility  Bed Mobility Overal bed mobility: Modified Independent             General bed mobility comments: Pt able to get EOB with use of bedrail and HOB elevated ~40 degrees  Transfers Overall transfer level: Needs assistance Equipment used: Rolling walker (2 wheeled) Transfers: Sit to/from [facility] Sit to Stand: Min guard Stand pivot transfers: Min guard       General transfer comment: Min guard assist for safety and line management.   Ambulation/Gait             General Gait Details: Pt continues to be on 30 L HFNC, unable to ambulate, so we continue to march in place until fatitgue or O2  sat drop into the 70s.  Pt was able to march in place today for 3 mins before fatigue.  She stayed 80-85% on 30 L HFNC.  Recovery time was 2 mins. Repeated x 2       Balance Overall balance assessment: Needs assistance Sitting-balance support: Feet supported;No upper extremity supported Sitting balance-Leahy Scale: Good     Standing balance support: Bilateral upper extremity supported Standing balance-Leahy Scale: Fair                              Cognition Arousal/Alertness: Awake/alert Behavior During Therapy: WFL for tasks assessed/performed Overall Cognitive Status: History of cognitive impairments - at baseline                                 General Comments: h/o cognitive delay      Exercises General Exercises - Upper Extremity Shoulder Flexion: AROM;Both;10 reps Elbow Flexion: AROM;Both;10 reps General Exercises - Lower Extremity Long Arc Quad: AROM;Both;10 reps Hip Flexion/Marching: AROM;Both;10 reps Toe Raises: AROM;Both;10 reps Heel Raises: AROM;Both;10 reps Other Exercises Other Exercises: IS x 10 with max volume of 750 mL, flutter valve x 10, encouraged 10 of each an hour.         Pertinent Vitals/Pain Pain Assessment: No/denies pain  PT Goals (current goals can now be found in the care plan section) Acute Rehab PT Goals Patient Stated Goal: to get back home to her prior level of independence Progress towards PT goals: Progressing toward goals    Frequency    Min 3X/week      PT Plan Current plan remains appropriate       AM-PAC PT "6 Clicks" Mobility   Outcome Measure  Help needed turning from your back to your side while in a flat bed without using bedrails?: A Little Help needed moving from lying on your back to sitting on the side of a flat bed without using bedrails?: A Little Help needed moving to and from a bed to a chair (including a wheelchair)?: A Little Help needed standing up from a chair using  your arms (e.g., wheelchair or bedside chair)?: A Little Help needed to walk in hospital room?: A Little Help needed climbing 3-5 steps with a railing? : A Little 6 Click Score: 18    End of Session Equipment Utilized During Treatment: Oxygen(30 L HFNC) Activity Tolerance: Patient tolerated treatment well Patient left: in chair;with call bell/phone within reach   PT Visit Diagnosis: Difficulty in walking, not elsewhere classified (R26.2)     Time: 7356-7014 PT Time Calculation (min) (ACUTE ONLY): 34 min  Charges:  $Therapeutic Exercise: 23-37 mins                    Harjas Biggins B. Gwenn Teodoro, PT, DPT  Acute Rehabilitation [phone] pager [phone] office  @ [facility]: [phone]   {06/26/2018, 12:33 PM

## 2018-06-26 NOTE — Progress Notes (Signed)
Advanced Heart Failure Rounding Note   Subjective:    Remains on HFNC at 30L sats in low 90s. CT scan of chest reviewed yesterday with extensive bibasilar consolidation/atelectasis  Denies SOB, orthopnea or PND. Doesn't seem to be using IS or flutter valve that much. Has diuresed 22 pounds.   Objective:   Weight Range:  Vital Signs:   Temp:  [98.2 F (36.8 C)-98.8 F (37.1 C)] 98.2 F (36.8 C) (06/22 0300) Pulse Rate:  [85-98] 91 (06/22 0748) Resp:  [19-28] 22 (06/22 0748) BP: (101-133)/(67-86) 118/69 (06/22 0748) SpO2:  [88 %-93 %] 90 % (06/22 0748) FiO2 (%):  [100 %] 100 % (06/22 0748) Weight:  [116.6 kg] 116.6 kg (06/22 0500) Last BM Date: 06/24/18  Weight change: Filed Weights   06/24/18 0300 06/25/18 0500 06/26/18 0500  Weight: 118.6 kg 119.5 kg 116.6 kg    Intake/Output:   Intake/Output Summary (Last 24 hours) at 06/26/2018 0828 Last data filed at 06/25/2018 2100 Gross per 24 hour  Intake --  Output 800 ml  Net -800 ml     Physical Exam: General:  Lying in bed on HFNC HEENT: normal Neck: supple. JVP hard to see. Carotids 2+ bilat; no bruits. No lymphadenopathy or thryomegaly appreciated. Cor: PMI nondisplaced. Regular rate & rhythm. 2/6 TR Lungs: clear with crackles in bases Abdomen: obese soft, nontender, nondistended. No hepatosplenomegaly. No bruits or masses. Good bowel sounds. Extremities: no cyanosis, clubbing, rash, edema. Chronic venous stasis changes Neuro: alert & orientedx3, cranial nerves grossly intact. moves all 4 extremities w/o difficulty. Affect pleasant    Telemetry: NSR 90s Personally reviewed   Labs: Basic Metabolic Panel: Recent Labs  Lab 06/21/18 0630 06/22/18 0619 06/23/18 0658 06/24/18 0836 06/25/18 0829  NA 140 140 140 137 139  K 3.5 4.0 4.3 3.7 4.1  CL 94* 96* 94* 96* 97*  CO2 34* 37* 36* 31 31  GLUCOSE 115* 117* 103* 112* 117*  BUN _0 CREATININE 0.87 0.77 0.78 0.67 0.74  CALCIUM 8.7* 8.7* 8.9 8.9  9.1  MG  --  1.9 2.0  --   --     Liver Function Tests: No results for input(s): AST, ALT, ALKPHOS, BILITOT, PROT, ALBUMIN in the last 168 hours. No results for input(s): LIPASE, AMYLASE in the last 168 hours. No results for input(s): AMMONIA in the last 168 hours.  CBC: Recent Labs  Lab 06/20/18 0611 06/23/18 0658  WBC 10.4 11.5*  HGB 15.7* 16.1*  HCT 49.1* 50.2*  MCV 89.9 90.9  PLT 297 368    Cardiac Enzymes: No results for input(s): CKTOTAL, CKMB, CKMBINDEX, TROPONINI in the last 168 hours.  BNP: BNP (last 3 results) Recent Labs    12/22/17 1247 06/15/18 0744  BNP 47.4 49.0    ProBNP (last 3 results) No results for input(s): PROBNP in the last 8760 hours.    Other results:  Imaging: Ct Chest Wo Contrast  Result Date: 06/25/2018 CLINICAL DATA:  Shortness of breath. EXAM: CT CHEST WITHOUT CONTRAST TECHNIQUE: Multidetector CT imaging of the chest was performed following the standard protocol without IV contrast. COMPARISON:  Chest radiograph 06/19/2018 FINDINGS: Cardiovascular: Right internal jugular approach central venous catheter terminates in the distal superior vena cava. The heart is enlarged. There is a pericardial effusion with maximum thickness of 9 mm at the base of the heart. Calcific atherosclerotic disease of the coronary arteries and aorta. Enlargement of the main pulmonary trunk, evidence of pulmonary arterial hypertension. Mediastinum/Nodes: Numerous  enlarged mediastinal lymph nodes with maximum short axis measurement of 18 mm. 2.1 cm soft tissue nodule within the isthmus of the thyroid gland. Lungs/Pleura: The major airways are patent. Patchy areas of airspace consolidation with lower lobe dependent predominance. Additional ground-glass opacities throughout the upper lung fields. Upper Abdomen: No acute abnormality. Musculoskeletal: Findings of diffuse idiopathic skeletal hyperostosis of the thoracic spine. IMPRESSION: 1. Confluent airspace consolidation in  the dependent portions of the lower lobes may represent multifocal pneumonia or subsegmental atelectasis. Additional scattered ground-glass opacities throughout the lung parenchyma, right greater than left, may represent asymmetric pulmonary edema or infectious consolidation. 2. Enlarged heart with small pericardial effusion. 3. Enlarged main pulmonary trunk, suggestive of pulmonary arterial hypertension. 4. 9 mm soft tissue nodule within the isthmus of the thyroid gland. Further evaluation with focused ultrasound may be considered. 5. Calcific atherosclerotic disease of the coronary arteries. Aortic Atherosclerosis (ICD10-I70.0). Electronically Signed   By: Fidela Salisbury M.D.   On: 06/25/2018 14:09     Medications:     Scheduled Medications:  gabapentin  600 mg Oral TID   insulin aspart  0-9 Units Subcutaneous TID WC   pantoprazole  20 mg Oral BID   potassium chloride  40 mEq Oral TID   pravastatin  20 mg Oral Daily   rivaroxaban  20 mg Oral Q supper   sildenafil  20 mg Oral TID   spironolactone  25 mg Oral Daily   torsemide  60 mg Oral BID   vitamin B-12  500 mcg Oral Daily    Infusions:  sodium chloride 250 mL (06/18/18 0916)   treprostinil (REMODULIN) infusion 77 ng/kg/min (06/23/18 1749)    PRN Medications: sodium chloride, acetaminophen **OR** acetaminophen, fluticasone, ipratropium-albuterol, ondansetron **OR** ondansetron (ZOFRAN) IV   Assessment/Plan:   1. Acute on chronic hypoxic respiratory failure due to possible CAP - On admit CXR concerning for possible retrocardiac infiltrate. Completed azithromycin and ceftriaxone x 7 days  Finished 3 days vanc for cellulitis as well  - Remains on HFNC which is increased from baseline despite aggressive diuresis - CT chest confirms severe bibasilar atelectasis/consolidation - I think her obesity and passive atelectasis are major issues here. Needs to use IS/flutter valve as instructed. Needs to get out of bed!!! -  Consider having Pulmonary evalaute for other suggestions if we cannot wean off HFNC - Continue torsemide 60 bid - I think volume status about as good as we can get it.  2. Possible RLE cellulitis -  Resolved. Received vanc x 3 days per IMTS  3. Acute Chronic diastolic HF/cor pulmonale:  - See discussion above  - Continue torsemide 60 bid - I think volume status about as good as we can get it. (was on torsemide 40 bid PTA) - need BMET this am  4. 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 However PAH far out of proportion to L-sided pressures or restrictive lung disease. - Follows with [facility]. Last seen 10/2017 - Remains on Remodulin & sildenafil  5. Paroxysmal atrial flutter.  -In Sinus - Continue Xarelto.No bleeding   6. Hypokalemia - Continue spiro 25. - BMET pending this am   Length of Stay: 11   Glori Bickers MD 06/26/2018, 8:28 AM  Advanced Heart Failure Team Pager [phone] (M-F; [place])  Please contact [facility] for night-coverage after hours (4p -7a ) and weekends on [email or web address]

## 2018-06-26 NOTE — Consult Note (Signed)
NAME:  [name], MRN:  [id number], DOB:  Jun 21, 1960, LOS: 39 ADMISSION DATE:  06/15/2018, CONSULTATION DATE:  06/26/2018 REFERRING MD:  Rosine Door, CHIEF COMPLAINT:  Hypoxemic respiratory failure   Brief History   58 year old female with pulmonary HTN with cor pulmonale presenting with fluid overload, acute on chronic hypoxemic respiratory failure, acute pulmonary edema and atelectasis.  Patient was diuresed 9 liters since admission and continues to be her pulmonary HTN medications but unable to decrease O2 demand.  Normally on 4 now on HFNC.  I noted that previous auto-immune work up from 6 years ago has been negative.  Patient denies any symptoms.  No fever, chills, N/V, SOB or cough.  History of present illness   58 year old female with pulmonary HTN with cor pulmonale presenting with fluid overload, acute on chronic hypoxemic respiratory failure, acute pulmonary edema and atelectasis.  Patient was diuresed 9 liters since admission and continues to be her pulmonary HTN medications but unable to decrease O2 demand.  Normally on 4 now on HFNC.  I noted that previous auto-immune work up from 6 years ago has been negative.  Patient denies any symptoms.  No fever, chills, N/V, SOB or cough.  Past Medical History  Pulmonary HTN CHF OSA  Significant Hospital Events   N/A  Consults:  Cards PCCM  Procedures:  N/A  Significant Diagnostic Tests:  CT of the chest that I reviewed myself with enlarged pulmonary artery and bibasilar atelectasis  Micro Data:  Blood 6/11 Corynebacterium species (likely contaminant) Blood 6/11 negative MRSA Negative COVID negative  Antimicrobials:  None   Interim history/subjective:  Feel better, no new complaints  Objective   Blood pressure 125/77, pulse 95, temperature 98.8 F (37.1 C), temperature source Oral, resp. rate 20, height _0  (1.6 m), weight 116.6 kg, last menstrual period 02/19/2011, SpO2 90 %.    FiO2 (%):  [100 %] 100 %    Intake/Output Summary (Last 24 hours) at 06/26/2018 1419 Last data filed at 06/26/2018 0900 Gross per 24 hour  Intake 538 ml  Output 800 ml  Net -262 ml   Filed Weights   06/24/18 0300 06/25/18 0500 06/26/18 0500  Weight: 118.6 kg 119.5 kg 116.6 kg    Examination: General: Chronically ill appearing female, NAD HENT: [place]/AT, PERRL, EOM-I and MMM Lungs: Bibasilar crackles Cardiovascular: RRR, Nl S1/S2 and -M/R/G Abdomen: Soft, obese, NT, ND and +BS Extremities: Non-pitting edema bilaterally Neuro: Alert and interactive, moving all ext to command Skin: Venous stasis on both legs but otherwise intact  Echo with pulmonary artery pressure of 78 4 years ago, EF 55-60% and dilated RV  Discussed with PCCM-NP and resident.  Resolved Hospital Problem list   N/A  Assessment & Plan:  58 year old female with severe pulmonary HTN presenting with a cute on chronic hypoxemic respiratory failure that is multi-factorial.  1.  Morbid obesity:  - Wt loss is a must  2. OSA:  - Insure CPAP compliance  - Continue bleeding O2 during the use of CPAP for sat >90  3. Chronic hypoxemia: Noted during exam is that patient is a mouth breather, not sure how much of the O2 being used is actually being inhaled as well as I suspect that the patient is chronically hypoxemic as when she dropped to 85 during exam (O2 off) she was completely asymptomatic.  Also her Hg is chronically 15-16 supporting chronic hypoxemia  - Asked RT to try a 50% face   -  Evaluate O2 based on that  4. Atelectasis:  - Ordered IS, flutter valve  - Ordered PT/OT and OOB  - Patient has to get up and move for that dependent atelectasis, she reports not having moved much since COVID begun and essentially has been in a chair or a bed so deconditioning is certainly a a factor here in a patient that is marginal on a good day  5. Pulmonary HTN and heart failure  - Keep dry as able  - Remodulin drip  - ?repeat echo to see if there is a  change in pulmonary artery pressure but will defer that to cards.  - Repeat auto-immune work up as ordered  Unfortunately this is a very difficult situation with multifactorial hypoxemic respiratory failure that may be reaching end stage and involvement of palliative maybe appropriate unless removing HFNC and placement or lower level of O2 is successful.  No role for steroids here.  PCCM will see again in AM to follow up on lab results  Labs   CBC: Recent Labs  Lab 06/20/18 0611 06/23/18 0658 06/26/18 0839  WBC 10.4 11.5* 10.8*  HGB 15.7* 16.1* 15.7*  HCT 49.1* 50.2* 49.8*  MCV 89.9 90.9 91.5  PLT 297 368 416*    Basic Metabolic Panel: Recent Labs  Lab 06/22/18 0619 06/23/18 0658 06/24/18 0836 06/25/18 0829 06/26/18 0839  NA 140 140 137 139 140  K 4.0 4.3 3.7 4.1 4.1  CL 96* 94* 96* 97* 96*  CO2 37* 36* 31 31 33*  GLUCOSE 117* 103* 112* 117* 123*  BUN _0 CREATININE 0.77 0.78 0.67 0.74 0.76  CALCIUM 8.7* 8.9 8.9 9.1 9.0  MG 1.9 2.0  --   --   --    GFR: Estimated Creatinine Clearance: 95.7 mL/min (by C-G formula based on SCr of 0.76 mg/dL). Recent Labs  Lab 06/20/18 0611 06/23/18 0658 06/26/18 0839  WBC 10.4 11.5* 10.8*    Liver Function Tests: No results for input(s): AST, ALT, ALKPHOS, BILITOT, PROT, ALBUMIN in the last 168 hours. No results for input(s): LIPASE, AMYLASE in the last 168 hours. No results for input(s): AMMONIA in the last 168 hours.  ABG    Component Value Date/Time   PHART 7.512 (H) 02/28/2014 1533   PCO2ART 34.3 (L) 02/28/2014 1533   PO2ART 126.0 (H) 02/28/2014 1533   HCO3 29.7 (H) 12/22/2017 1302   TCO2 31 12/22/2017 1302   ACIDBASEDEF 7.0 (H) 07/14/2012 0524   O2SAT 87.0 12/22/2017 1302     Coagulation Profile: No results for input(s): INR, PROTIME in the last 168 hours.  Cardiac Enzymes: No results for input(s): CKTOTAL, CKMB, CKMBINDEX, TROPONINI in the last 168 hours.  HbA1C: Hemoglobin A1C  Date/Time Value  Ref Range Status  10/31/2017 01:39 PM 6.1 (A) 4.0 - 5.6 % Final  06/06/2017 02:06 PM 6.0 (A) 4.0 - 5.6 % Final   Hgb A1c MFr Bld  Date/Time Value Ref Range Status  10/14/2010 10:41 AM 13.5 (H) <5.7 % Final    Comment:                                                                           According to the ADA  Clinical Practice Recommendations for 2011, when HbA1c is used as a screening test:     >=6.5%   Diagnostic of Diabetes Mellitus            (if abnormal result is confirmed)   5.7-6.4%   Increased risk of developing Diabetes Mellitus   References:Diagnosis and Classification of Diabetes Mellitus,Diabetes HXTA,5697,94(IAXKP 1):S62-S69 and Standards of Medical Care in         Diabetes - 2011,Diabetes VVZS,8270,78 (Suppl 1):S11-S61.    CBG: Recent Labs  Lab 06/25/18 1107 06/25/18 1610 06/25/18 2141 06/26/18 0832 06/26/18 1152  GLUCAP 147* 116* 144* 105* 100*    Review of Systems:   12 point ROS is negative other than above  Past Medical History  She,  has a past medical history of Anemia, iron deficiency, CHF (congestive heart failure) (HCC), Chronic cough, Cognitive impairment, COPD (chronic obstructive pulmonary disease) (HCC), Cor pulmonale ([place]), Depression, Diabetes mellitus, Diastolic heart failure ([place]), GERD (gastroesophageal reflux disease), H/O mental retardation, Herpes, Hyperlipidemia, Hypertension, OSA (obstructive sleep apnea), Pulmonary hypertension ([place]), Renal disorder, Restrictive lung disease, Shortness of breath, and Venous stasis ulcer ([place]).   Surgical History    Past Surgical History:  Procedure Laterality Date  . CARDIAC CATHETERIZATION N/A 01/13/2016   Procedure: Right Heart Cath;  Surgeon: Jolaine Artist, MD;  Location: [place] [facility];  Service: Cardiovascular;  Laterality: N/A;  . CHOLECYSTECTOMY    . LEFT AND RIGHT HEART CATHETERIZATION WITH CORONARY ANGIOGRAM N/A 02/28/2014   Procedure: LEFT AND RIGHT HEART CATHETERIZATION WITH  CORONARY ANGIOGRAM;  Surgeon: Jolaine Artist, MD;  Location: [facility] CATH LAB;  Service: Cardiovascular;  Laterality: N/A;     Social History   reports that she has never smoked. She has never used smokeless tobacco. She reports that she does not drink alcohol or use drugs.   Family History   Her family history includes Bipolar disorder in her sister; Breast cancer in her maternal aunt; Hyperlipidemia in her mother; Kidney Stones in her son; Mental illness in her sister; Mental retardation in her brother.   Allergies Allergies  Allergen Reactions  . Aspirin     REACTION: airway swelling  . Codeine     REACTION: tingling in lips and hard breathing - had reaction at dentist - states "I can't take certain kinds of codeine" - happened maybe 10 yr ago  . Lisinopril     Cough  . Sulfonamide Derivatives     REACTION: airway swelling  . Latex Rash     Home Medications  Prior to Admission medications   Medication Sig Start Date End Date Taking? Authorizing Provider  acetaminophen (TYLENOL) 500 MG tablet Take 1 tablet (500 mg total) by mouth every 6 (six) hours as needed. 08/21/15  Yes [name], MD  cetirizine (ZYRTEC) 10 MG tablet Take 1 tablet (10 mg total) by mouth daily. 05/05/18  Yes [name], MD  cyanocobalamin 500 MCG tablet Take 500 mcg by mouth daily.   Yes [provider]  cyclobenzaprine (FLEXERIL) 5 MG tablet Take 1 tablet (5 mg total) by mouth daily as needed for muscle spasms. 06/07/18  Yes Neva Seat, MD  diphenhydrAMINE (BENADRYL) 25 mg capsule Take 25-75 mg by mouth 2 (two) times daily as needed for itching.    Yes [provider]  ferrous sulfate 325 (65 FE) MG tablet Take 1 tablet (325 mg total) by mouth daily with breakfast. 07/23/14  Yes Corky Sox, MD  fluticasone (FLONASE) 50 MCG/ACT nasal spray  Place 1 spray into both nostrils daily. Patient taking differently: Place 1 spray into both nostrils as needed for allergies or rhinitis.   01/05/18 01/05/19 Yes Mosetta Anis, MD  gabapentin (NEURONTIN) 300 MG capsule TAKE 2 CAPSULES BY MOUTH THREE TIMES DAILY Patient taking differently: Take 600 mg by mouth 3 (three) times daily.  09/26/17  Yes [name], MD  metFORMIN (GLUCOPHAGE) 500 MG tablet TAKE ONE-HALF TABLET BY MOUTH WITH BREAKFAST Patient taking differently: Take 250 mg by mouth daily with breakfast.  04/16/17  Yes Winfrey, Jenne Pane, MD  pantoprazole (PROTONIX) 20 MG tablet Take 1 tablet (20 mg total) by mouth 2 (two) times daily. 10/05/17  Yes [name], MD  potassium chloride SA (K-DUR,KLOR-CON) 20 MEQ tablet TAKE 2 TABLETS BY MOUTH THREE TIMES DAILY TAKE  AN  EXTRA  TABLET  WHEN  TAKING  AN  EXTRA  TORSEMIDE Patient taking differently: Take 40 mEq by mouth 3 (three) times daily. TAKE  AN  EXTRA  TABLET  WHEN  TAKING  AN  EXTRA  TORSEMIDE 12/05/17  Yes Winfrey, Jenne Pane, MD  pravastatin (PRAVACHOL) 20 MG tablet TAKE 1 TABLET BY MOUTH ONCE DAILY Patient taking differently: Take 20 mg by mouth daily.  12/05/17  Yes [name], MD  sildenafil (REVATIO) 20 MG tablet Take 1 tablet (20 mg total) by mouth 3 (three) times daily. 12/29/17  Yes Bensimhon, [name], MD  sodium chloride (OCEAN) 0.65 % SOLN nasal spray Place 2 sprays into both nostrils as needed for congestion. 02/12/16  Yes [name], MD  spironolactone (ALDACTONE) 25 MG tablet Take 1/2 (one-half) tablet by mouth once daily Patient taking differently: Take 12.5 mg by mouth daily.  05/19/18  Yes [name], MD  torsemide (DEMADEX) 20 MG tablet Take 2 tablets (40 mg total) by mouth 2 (two) times daily. 07/05/17  Yes Clegg, Amy D, NP  treprostinil (REMODULIN) 5 MG/ML SOLN injection Inject 6 ng/mL into the skin continuous.    Yes [provider]  XARELTO 20 MG TABS tablet TAKE 1 TABLET BY MOUTH ONCE DAILY WITH SUPPER Patient taking differently: Take 20 mg by mouth daily with supper.  02/20/18  Yes [name], MD  ACCU-CHEK AVIVA  PLUS test strip USE 1 STRIP TO CHECK GLUCOSE ONCE DAILY 05/25/18   [name], MD  diclofenac sodium (VOLTAREN) 1 % GEL Apply 2 g topically 4 (four) times daily. 06/07/18   Neva Seat, MD  mupirocin ointment (BACTROBAN) 2 % Place 1 application into the nose 2 (two) times daily. Patient not taking: Reported on 06/15/2018 02/25/17   Ledell Noss, MD  nystatin cream (MYCOSTATIN) Apply 1 application topically 2 (two) times daily. Patient not taking: Reported on 06/15/2018 08/20/16   Constant, Vickii Chafe, MD    Rush Farmer, M.D. [facility] Pulmonary/Critical Care Medicine. Pager: [phone]. After hours pager: [phone].

## 2018-06-26 NOTE — Progress Notes (Signed)
Pt noted to have new developing rash on back. Pt stated probably from some lotion previously used. MD paged.

## 2018-06-26 NOTE — Progress Notes (Addendum)
Subjective: HD#11   Overnight: No acute events reported   Today, [name] was examined at bedside and was in good spirits and very high energy.  In regards to her symptoms, they have been unchanged and she has been compliant with nighttime CPAP and continues to require 30 L of supplemental oxygen.  She was able to demonstrate incentive spirometer and flutter valve before the team  Objective:  Vital signs in last 24 hours: Vitals:   06/25/18 2035 06/25/18 2323 06/26/18 0300 06/26/18 0500  BP: 129/68 101/74 118/69   Pulse: 93 88 85   Resp: (!) 25 (!) 26 (!) 26   Temp: 98.8 F (37.1 C) 98.5 F (36.9 C) 98.2 F (36.8 C)   TempSrc: Oral Axillary Axillary   SpO2: 92% 93% 93%   Weight:    116.6 kg  Height:       Const: In no distress, lying in bed with high flow nasal cannula HEENT: Atraumatic, normocephalic Resp: Bibasilar crackles, SPO2 88-92% on 30 L high flow nasal cannula  Assessment/Plan:  Principal Problem:   CAP (community acquired pneumonia) Active Problems:   Obstructive sleep apnea   Diabetes ([place])   Right heart failure, NYHA class 3 (HCC)   Pulmonary hypertension (HCC)   Acute on chronic diastolic (congestive) heart failure (HCC)   Acute on chronic respiratory failure (HCC)   PAH (pulmonary artery hypertension) (HCC)  [name] is a 58 year old female with a past medical history notable for PAH, CHF, paroxysmal atrial flutter who was admitted for acute on chronic hypoxic respiratory failure secondary to Pneumonia. Admission has been complicated by her increasingrequirement for supplemental oxygen. Currently weaning supplemental oxygen has been impossible due to persistent hypoxia.  A/P: #Acute on chronic hypoxia respiratory failure #Pulmonary hypertension, cor pulmonale #Chronic diastolic heart failure #Community-acquired pneumonia(retrocardiac) Clinically, her dyspnea has been stable however challenges of weaning her off high flow nasal cannula  remains. UOP over the past 24 hours of 800 cc.  Since admission, she is net -9 L and her weight this a.m. is 116 kg from 119 yesterday.  During her session with occupational therapy yesterday, she had an oxygen desaturation to the 70s with dynamic standing activities and had to be placed back on 25 L high flow nasal cannula.  Repeat CT scan of the chest yesterday showed airspace consolidation with groundglass opacities throughout the lung parenchyma concerning for multifocal pneumonia versus atelectasis.  I was able to speak to Dr. Nelda Marseille (pulmonary) who made me aware that he is ordering autoimmune studies and also realized Ms. Phillippi is some obturator and nasal cannula might not be the optimal source of supplemental oxygen.  CT scan of the chest showed bibasilar atelectasis versus multifocal pneumonia. -Continue Remodulin infusion -Continue torsemide 60 mg BID -Continue spironolactone32m daily -Continue sildenafil -Scheduled Duoneb TID -Continue aggressive pulmonary toilet -Continue strict I's and O's -Continue daily weights -Continue to closely monitor her electrolytes  Code: Full Diet: Heart healthy carb modified VTE prophylaxis: Xarelto Dispo: Anticipated discharge in approximatelyhowever any days and until medically stable.  AJean Rosenthal MD 06/26/2018, 5:49 AM Pager: [phone] Medicine Teaching Service

## 2018-06-27 ENCOUNTER — Encounter (HOSPITAL_COMMUNITY)
Admission: EM | Disposition: A | Discharge status: Home Health | Payer: Self-pay | Source: Home / Self Care | Attending: Student in an Organized Health Care Education/Training Program

## 2018-06-27 ENCOUNTER — Other Ambulatory Visit (HOSPITAL_COMMUNITY): Payer: Self-pay | Admitting: Pulmonary Disease

## 2018-06-27 ENCOUNTER — Encounter (HOSPITAL_COMMUNITY): Payer: Self-pay | Admitting: Internal Medicine

## 2018-06-27 DIAGNOSIS — J9811 Atelectasis: Secondary | ICD-10-CM | POA: Diagnosis present

## 2018-06-27 DIAGNOSIS — E875 Hyperkalemia: Secondary | ICD-10-CM

## 2018-06-27 HISTORY — PX: RIGHT HEART CATH: CATH118263

## 2018-06-27 LAB — POCT I-STAT 7, (LYTES, BLD GAS, ICA,H+H)
Acid-Base Excess: 2 mmol/L (ref 0.0–2.0)
Bicarbonate: 30.2 mmol/L — ABNORMAL HIGH (ref 20.0–28.0)
Calcium, Ion: 1.2 mmol/L (ref 1.15–1.40)
HCT: 49 % — ABNORMAL HIGH (ref 36.0–46.0)
Hemoglobin: 16.7 g/dL — ABNORMAL HIGH (ref 12.0–15.0)
O2 Saturation: 84 %
Potassium: 3.5 mmol/L (ref 3.5–5.1)
Sodium: 143 mmol/L (ref 135–145)
TCO2: 32 mmol/L (ref 22–32)
pCO2 arterial: 57.8 mmHg — ABNORMAL HIGH (ref 32.0–48.0)
pH, Arterial: 7.326 — ABNORMAL LOW (ref 7.350–7.450)
pO2, Arterial: 54 mmHg — ABNORMAL LOW (ref 83.0–108.0)

## 2018-06-27 LAB — POCT I-STAT EG7
Acid-Base Excess: 9 mmol/L — ABNORMAL HIGH (ref 0.0–2.0)
Acid-Base Excess: 4 mmol/L — ABNORMAL HIGH (ref 0.0–2.0)
Bicarbonate: 36.5 mmol/L — ABNORMAL HIGH (ref 20.0–28.0)
Bicarbonate: 32.7 mmol/L — ABNORMAL HIGH (ref 20.0–28.0)
Calcium, Ion: 1.12 mmol/L — ABNORMAL LOW (ref 1.15–1.40)
Calcium, Ion: 1.21 mmol/L (ref 1.15–1.40)
HCT: 48 % — ABNORMAL HIGH (ref 36.0–46.0)
HCT: 50 % — ABNORMAL HIGH (ref 36.0–46.0)
Hemoglobin: 16.3 g/dL — ABNORMAL HIGH (ref 12.0–15.0)
Hemoglobin: 17 g/dL — ABNORMAL HIGH (ref 12.0–15.0)
O2 Saturation: 63 %
O2 Saturation: 59 %
Potassium: 3.3 mmol/L — ABNORMAL LOW (ref 3.5–5.1)
Potassium: 3.6 mmol/L (ref 3.5–5.1)
Sodium: 141 mmol/L (ref 135–145)
Sodium: 141 mmol/L (ref 135–145)
TCO2: 38 mmol/L — ABNORMAL HIGH (ref 22–32)
TCO2: 35 mmol/L — ABNORMAL HIGH (ref 22–32)
pCO2, Ven: 59.3 mmHg (ref 44.0–60.0)
pCO2, Ven: 61.4 mmHg — ABNORMAL HIGH (ref 44.0–60.0)
pH, Ven: 7.397 (ref 7.250–7.430)
pH, Ven: 7.334 (ref 7.250–7.430)
pO2, Ven: 34 mmHg (ref 32.0–45.0)
pO2, Ven: 34 mmHg (ref 32.0–45.0)

## 2018-06-27 LAB — BASIC METABOLIC PANEL
Anion gap: 12 (ref 5–15)
BUN: 17 mg/dL (ref 6–20)
CO2: 30 mmol/L (ref 22–32)
Calcium: 8.9 mg/dL (ref 8.9–10.3)
Chloride: 95 mmol/L — ABNORMAL LOW (ref 98–111)
Creatinine, Ser: 0.68 mg/dL (ref 0.44–1.00)
GFR calc Af Amer: >60 mL/min (ref >60)
GFR calc non Af Amer: >60 mL/min (ref >60)
Glucose, Bld: 99 mg/dL (ref 70–99)
Potassium: 4.9 mmol/L (ref 3.5–5.1)
Sodium: 137 mmol/L (ref 135–145)

## 2018-06-27 LAB — ANTI-SCLERODERMA ANTIBODY: Scleroderma (Scl-70) (ENA) Antibody, IgG: 0.4 AI (ref 0.0–0.9)

## 2018-06-27 LAB — ANGIOTENSIN CONVERTING ENZYME: Angiotensin-Converting Enzyme: 24 U/L (ref 14–82)

## 2018-06-27 LAB — GLUCOSE, CAPILLARY
Glucose-Capillary: 102 mg/dL — ABNORMAL HIGH (ref 70–99)
Glucose-Capillary: 89 mg/dL (ref 70–99)
Glucose-Capillary: 139 mg/dL — ABNORMAL HIGH (ref 70–99)
Glucose-Capillary: 168 mg/dL — ABNORMAL HIGH (ref 70–99)

## 2018-06-27 LAB — ANA W/REFLEX IF POSITIVE: Anti Nuclear Antibody (ANA): NEGATIVE

## 2018-06-27 SURGERY — RIGHT HEART CATH
Anesthesia: LOCAL

## 2018-06-27 MED ORDER — LIDOCAINE HCL (PF) 1 % IJ SOLN
INTRAMUSCULAR | Status: DC | PRN
  Administered 2018-06-27: 30 mL

## 2018-06-27 MED ORDER — HYDROXYZINE HCL 10 MG PO TABS
10.0000 mg | ORAL_TABLET | Freq: Once | ORAL | Status: AC
  Administered 2018-06-27: 10 mg via ORAL
  Filled 2018-06-27: qty 1

## 2018-06-27 MED ORDER — HEPARIN (PORCINE) IN NACL 1000-0.9 UT/500ML-% IV SOLN
INTRAVENOUS | Status: DC | PRN
  Administered 2018-06-27: 500 mL

## 2018-06-27 MED ORDER — LIDOCAINE HCL (PF) 1 % IJ SOLN
INTRAMUSCULAR | Status: AC
  Filled 2018-06-27: qty 30

## 2018-06-27 MED ORDER — TREPROSTINIL 100 MG/20ML IJ SOLN
81.0000 ng/kg/min | INTRAVENOUS | Status: DC
  Administered 2018-06-27: 76.806 ng/kg/min via INTRAVENOUS
  Filled 2018-06-27 (×6): qty 7

## 2018-06-27 MED ORDER — HEPARIN (PORCINE) IN NACL 1000-0.9 UT/500ML-% IV SOLN
INTRAVENOUS | Status: AC
  Filled 2018-06-27: qty 500

## 2018-06-27 SURGICAL SUPPLY — 7 items
CATH SWAN GANZ 7F STRAIGHT (CATHETERS) ×1 IMPLANT
KIT MICROPUNCTURE NIT STIFF (SHEATH) IMPLANT
PACK CARDIAC CATHETERIZATION (CUSTOM PROCEDURE TRAY) ×2 IMPLANT
SHEATH PINNACLE 7F 10CM (SHEATH) ×1 IMPLANT
SHEATH PROBE COVER 6X72 (BAG) ×1 IMPLANT
TRANSDUCER W/STOPCOCK (MISCELLANEOUS) ×2 IMPLANT
WIRE EMERALD 3MM-J .035X150CM (WIRE) ×1 IMPLANT

## 2018-06-27 NOTE — Progress Notes (Signed)
Patient had an uneventful night.  Tolerated Bipap with no distress.  Stable condition, will continue to monitor.

## 2018-06-27 NOTE — H&P (View-Only) (Signed)
Advanced Heart Failure Rounding Note   Subjective:     Remains on high flow O2. Sats low 90s. Pulmonary has seen and recommended aggressive pulmonary toilet. Question raised as to worsening PAH.   Denies SOB, orthopnea or PND. Back on oral torsemide. Weight stable 257 pounds  Objective:   Weight Range:  Vital Signs:   Temp:  [98 F (36.7 C)-99.8 F (37.7 C)] 98.5 F (36.9 C) (06/23 0720) Pulse Rate:  [83-96] 83 (06/23 0720) Resp:  [18-28] 26 (06/23 0720) BP: (92-132)/(64-77) 92/64 (06/23 0720) SpO2:  [87 %-96 %] 93 % (06/23 0720) FiO2 (%):  [55 %-100 %] 100 % (06/22 2041) Weight:  [116.6 kg] 116.6 kg (06/23 0500) Last BM Date: 06/24/18  Weight change: Filed Weights   06/25/18 0500 06/26/18 0500 06/27/18 0500  Weight: 119.5 kg 116.6 kg 116.6 kg    Intake/Output:   Intake/Output Summary (Last 24 hours) at 06/27/2018 0806 Last data filed at 06/26/2018 2221 Gross per 24 hour  Intake 896 ml  Output 1500 ml  Net -604 ml     Physical Exam: General:  Lying in bed on HFNC HEENT: normal Neck: supple. JVP hard to see Carotids 2+ bilat; no bruits. No lymphadenopathy or thryomegaly appreciated. Cor: PMI nondisplaced. Regular rate & rhythm. No rubs, gallops or murmurs. Lungs: clear crackles at the bases Abdomen: obese soft, nontender, nondistended. No hepatosplenomegaly. No bruits or masses. Good bowel sounds. Extremities: no cyanosis, clubbing, rash, edema + chronic venous stasis changes Neuro: alert & orientedx3, cranial nerves grossly intact. moves all 4 extremities w/o difficulty. Affect pleasant    Telemetry: NSR 90-100 Personally reviewed   Labs: Basic Metabolic Panel: Recent Labs  Lab 06/22/18 0619 06/23/18 0658 06/24/18 0836 06/25/18 0829 06/26/18 0839  NA 140 140 137 139 140  K 4.0 4.3 3.7 4.1 4.1  CL 96* 94* 96* 97* 96*  CO2 37* 36* 31 31 33*  GLUCOSE 117* 103* 112* 117* 123*  BUN _0 CREATININE 0.77 0.78 0.67 0.74 0.76  CALCIUM 8.7*  8.9 8.9 9.1 9.0  MG 1.9 2.0  --   --   --     Liver Function Tests: No results for input(s): AST, ALT, ALKPHOS, BILITOT, PROT, ALBUMIN in the last 168 hours. No results for input(s): LIPASE, AMYLASE in the last 168 hours. No results for input(s): AMMONIA in the last 168 hours.  CBC: Recent Labs  Lab 06/23/18 0658 06/26/18 0839  WBC 11.5* 10.8*  HGB 16.1* 15.7*  HCT 50.2* 49.8*  MCV 90.9 91.5  PLT 368 416*    Cardiac Enzymes: No results for input(s): CKTOTAL, CKMB, CKMBINDEX, TROPONINI in the last 168 hours.  BNP: BNP (last 3 results) Recent Labs    12/22/17 1247 06/15/18 0744  BNP 47.4 49.0    ProBNP (last 3 results) No results for input(s): PROBNP in the last 8760 hours.    Other results:  Imaging: Ct Chest Wo Contrast  Result Date: 06/25/2018 CLINICAL DATA:  Shortness of breath. EXAM: CT CHEST WITHOUT CONTRAST TECHNIQUE: Multidetector CT imaging of the chest was performed following the standard protocol without IV contrast. COMPARISON:  Chest radiograph 06/19/2018 FINDINGS: Cardiovascular: Right internal jugular approach central venous catheter terminates in the distal superior vena cava. The heart is enlarged. There is a pericardial effusion with maximum thickness of 9 mm at the base of the heart. Calcific atherosclerotic disease of the coronary arteries and aorta. Enlargement of the main pulmonary trunk, evidence of pulmonary arterial  hypertension. Mediastinum/Nodes: Numerous enlarged mediastinal lymph nodes with maximum short axis measurement of 18 mm. 2.1 cm soft tissue nodule within the isthmus of the thyroid gland. Lungs/Pleura: The major airways are patent. Patchy areas of airspace consolidation with lower lobe dependent predominance. Additional ground-glass opacities throughout the upper lung fields. Upper Abdomen: No acute abnormality. Musculoskeletal: Findings of diffuse idiopathic skeletal hyperostosis of the thoracic spine. IMPRESSION: 1. Confluent airspace  consolidation in the dependent portions of the lower lobes may represent multifocal pneumonia or subsegmental atelectasis. Additional scattered ground-glass opacities throughout the lung parenchyma, right greater than left, may represent asymmetric pulmonary edema or infectious consolidation. 2. Enlarged heart with small pericardial effusion. 3. Enlarged main pulmonary trunk, suggestive of pulmonary arterial hypertension. 4. 9 mm soft tissue nodule within the isthmus of the thyroid gland. Further evaluation with focused ultrasound may be considered. 5. Calcific atherosclerotic disease of the coronary arteries. Aortic Atherosclerosis (ICD10-I70.0). Electronically Signed   By: Fidela Salisbury M.D.   On: 06/25/2018 14:09     Medications:     Scheduled Medications:  gabapentin  600 mg Oral TID   insulin aspart  0-9 Units Subcutaneous TID WC   ipratropium-albuterol  3 mL Nebulization TID   pantoprazole  20 mg Oral BID   potassium chloride  40 mEq Oral TID   pravastatin  20 mg Oral Daily   rivaroxaban  20 mg Oral Q supper   sildenafil  20 mg Oral TID   spironolactone  25 mg Oral Daily   torsemide  60 mg Oral BID   vitamin B-12  500 mcg Oral Daily    Infusions:  sodium chloride 250 mL (06/18/18 0916)   treprostinil (REMODULIN) infusion 77 ng/kg/min (06/23/18 1749)    PRN Medications: sodium chloride, acetaminophen **OR** acetaminophen, albuterol, fluticasone, ondansetron **OR** ondansetron (ZOFRAN) IV   Assessment/Plan:   1. Acute on chronic hypoxic respiratory failure due to possible CAP - On admit CXR concerning for possible retrocardiac infiltrate. Completed azithromycin and ceftriaxone x 7 days  Finished 3 days vanc for cellulitis as well  - Remains on HFNC which is increased from baseline despite aggressive diuresis - CT chest confirms severe bibasilar atelectasis/consolidation - I think her obesity and passive atelectasis are major issues here. Needs to use  IS/flutter valve as instructed. Needs to get out of bed!!! - Appreciate Pulmonary recs. I have discussed RHC with her to reassess Pulmonary pressures and she has agreed to proceed. Plan RHC this am to re-evaluate and see if we can improve oxygenation by adjusting PAH meds (I think less likely but worth a shot) - Continue torsemide 60 bid - I think volume status about as good as we can get it. Weight and renal function stable   2. Possible RLE cellulitis -  Resolved. Received vanc x 3 days per IMTS  3. Acute Chronic diastolic HF/cor pulmonale:  - See discussion above  - Continue torsemide 60 bid - I think volume status about as good as we can get it. (was on torsemide 40 bid PTA) - need BMET this am  4. 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 However PAH far out of proportion to L-sided pressures or restrictive lung disease. - Follows with [facility]. Last seen 10/2017 - Remains on Remodulin & sildenafil  5. Paroxysmal atrial flutter.  -In Sinus - Continue Xarelto.No bleeding   6. Hypokalemia - Continue spiro 25. - BMET pending this am   Length of Stay: 12  Glori Bickers MD 06/27/2018, 8:06 AM  Advanced Heart Failure Team Pager [phone] (M-F; 7a - 4p)  Please contact [facility] for night-coverage after hours (4p -7a ) and weekends on [email or web address]

## 2018-06-27 NOTE — Progress Notes (Signed)
OT Cancellation Note  Patient Details Name: [name] MRN: [id number] DOB: 03-Apr-1960   Cancelled Treatment:    Reason Eval/Treat Not Completed: Patient at procedure or test/ unavailable. Pt in Cath lab.[place], OTR/L Acute Rehab Services Pager [phone] Office [phone]     Almon Register 06/27/2018, 8:59 AM

## 2018-06-27 NOTE — Progress Notes (Signed)
NAME:  [name], MRN:  [id number], DOB:  25-Mar-1960, LOS: 54 ADMISSION DATE:  06/15/2018, CONSULTATION DATE:  06/26/2018 REFERRING MD:  Rosine Door, CHIEF COMPLAINT:  Hypoxemic respiratory failure   Brief History   58 year old female with pulmonary HTN with cor pulmonale presenting with fluid overload, acute on chronic hypoxemic respiratory failure, acute pulmonary edema and atelectasis.  Patient was diuresed 9 liters since admission and continues to be her pulmonary HTN medications but unable to decrease O2 demand.  Normally on 4 now on HFNC.  I noted that previous auto-immune work up from 6 years ago has been negative.  Patient denies any symptoms.  No fever, chills, N/V, SOB or cough.  History of present illness   58 year old female with pulmonary HTN with cor pulmonale presenting with fluid overload, acute on chronic hypoxemic respiratory failure, acute pulmonary edema and atelectasis.  Patient was diuresed 9 liters since admission and continues to be her pulmonary HTN medications but unable to decrease O2 demand.  Normally on 4 now on HFNC.  I noted that previous auto-immune work up from 6 years ago has been negative.  Patient denies any symptoms.  No fever, chills, N/V, SOB or cough.  Past Medical History  Pulmonary HTN CHF OSA  Significant Hospital Events   N/A  Consults:  Cards PCCM  Procedures:  N/A  Significant Diagnostic Tests:  CT of the chest that I reviewed myself with enlarged pulmonary artery and bibasilar atelectasis  Micro Data:  Blood 6/11 Corynebacterium species (likely contaminant) Blood 6/11 negative MRSA Negative COVID negative  Antimicrobials:  None   Interim history/subjective:  Went to cath lab for RHC this AM, no new complaints  Objective   Blood pressure 92/64, pulse 83, temperature 98.5 F (36.9 C), temperature source Oral, resp. rate (!) 26, height _0  (1.6 m), weight 116.6 kg, last menstrual period 02/19/2011, SpO2 (!) 86 %.     FiO2 (%):  [55 %-100 %] 100 %   Intake/Output Summary (Last 24 hours) at 06/27/2018 1114 Last data filed at 06/26/2018 2221 Gross per 24 hour  Intake 358 ml  Output 1500 ml  Net -1142 ml   Filed Weights   06/25/18 0500 06/26/18 0500 06/27/18 0500  Weight: 119.5 kg 116.6 kg 116.6 kg    Examination: General: Chronically ill appearing female, NAD HENT: [place]/AT, PERRL, EOM-I and MMM Lungs: Bibasilar crackles Cardiovascular: RRR, Nl S1/S2 and -M/R/G Abdomen: Soft, obese, NT, ND and +BS Extremities: Non-pitting edema bilaterally Neuro: Alert and interactive, moving all ext to command Skin: Venous stasis on both legs but otherwise intact  Echo with pulmonary artery pressure of 78 4 years ago, EF 55-60% and dilated RV  RHC results pending  Discussed with PCCM-NP  I reviewed CXR myself, bibasilar opacities noted  Resolved Hospital Problem list   N/A  Assessment & Plan:  58 year old female with severe pulmonary HTN presenting with a cute on chronic hypoxemic respiratory failure that is multi-factorial.  1.  Morbid obesity:  - Wt loss is a must  2. OSA:  - Insure BiPAP compliance  - Will likely need BiPAP at home and insure she is using it  - Continue bleeding O2 during the use of BiPAP for sat >90  3. Chronic hypoxemia: Noted during exam is that patient is a mouth breather, not sure how much of the O2 being used is actually being inhaled as well as I suspect that the patient is chronically hypoxemic as when  she dropped to 85 during exam (O2 off) she was completely asymptomatic.  Also her Hg is chronically 15-16 supporting chronic hypoxemia  - Down to 4L flow with 100%, will change to standard nasal cannula   - Evaluate O2 based on that  4. Atelectasis:  - Ordered IS, flutter valve  - Ordered PT/OT and OOB  - Patient has to get up and move for that dependent atelectasis, she reports not having moved much since COVID begun and essentially has been in a chair or a bed so  deconditioning is certainly a a factor here in a patient that is marginal on a good day  5. Pulmonary HTN and heart failure  - Keep dry as able  - Remodulin drip  - ?repeat echo to see if there is a change in pulmonary artery pressure but will defer that to cards.  - Repeat autoimmune work up pending  Reduce to standard nasal cannula, PCCM will sign off, if autoimmune work up is eventful please call us back  Labs   CBC: Recent Labs  Lab 06/23/18 0658 06/26/18 0839  WBC 11.5* 10.8*  HGB 16.1* 15.7*  HCT 50.2* 49.8*  MCV 90.9 91.5  PLT 368 416*    Basic Metabolic Panel: Recent Labs  Lab 06/22/18 0619 06/23/18 0658 06/24/18 0836 06/25/18 0829 06/26/18 0839  NA 140 140 137 139 140  K 4.0 4.3 3.7 4.1 4.1  CL 96* 94* 96* 97* 96*  CO2 37* 36* 31 31 33*  GLUCOSE 117* 103* 112* 117* 123*  BUN _0 CREATININE 0.77 0.78 0.67 0.74 0.76  CALCIUM 8.7* 8.9 8.9 9.1 9.0  MG 1.9 2.0  --   --   --    GFR: Estimated Creatinine Clearance: 95.7 mL/min (by C-G formula based on SCr of 0.76 mg/dL). Recent Labs  Lab 06/23/18 0658 06/26/18 0839 06/26/18 1459  PROCALCITON  --   --  <0.10  WBC 11.5* 10.8*  --     Liver Function Tests: No results for input(s): AST, ALT, ALKPHOS, BILITOT, PROT, ALBUMIN in the last 168 hours. No results for input(s): LIPASE, AMYLASE in the last 168 hours. No results for input(s): AMMONIA in the last 168 hours.  ABG    Component Value Date/Time   PHART 7.512 (H) 02/28/2014 1533   PCO2ART 34.3 (L) 02/28/2014 1533   PO2ART 126.0 (H) 02/28/2014 1533   HCO3 29.7 (H) 12/22/2017 1302   TCO2 31 12/22/2017 1302   ACIDBASEDEF 7.0 (H) 07/14/2012 0524   O2SAT 87.0 12/22/2017 1302     Coagulation Profile: No results for input(s): INR, PROTIME in the last 168 hours.  Cardiac Enzymes: No results for input(s): CKTOTAL, CKMB, CKMBINDEX, TROPONINI in the last 168 hours.  HbA1C: Hemoglobin A1C  Date/Time Value Ref Range Status  10/31/2017 01:39  PM 6.1 (A) 4.0 - 5.6 % Final  06/06/2017 02:06 PM 6.0 (A) 4.0 - 5.6 % Final   Hgb A1c MFr Bld  Date/Time Value Ref Range Status  10/14/2010 10:41 AM 13.5 (H) <5.7 % Final    Comment:  According to the ADA Clinical Practice Recommendations for 2011, when HbA1c is used as a screening test:     >=6.5%   Diagnostic of Diabetes Mellitus            (if abnormal result is confirmed)   5.7-6.4%   Increased risk of developing Diabetes Mellitus   References:Diagnosis and Classification of Diabetes Mellitus,Diabetes ULAG,5364,68(EHOZY 1):S62-S69 and Standards of Medical Care in         Diabetes - 2011,Diabetes YQMG,5003,70 (Suppl 1):S11-S61.    CBG: Recent Labs  Lab 06/26/18 0832 06/26/18 1152 06/26/18 1637 06/26/18 2201 06/27/18 0803  GLUCAP 105* 100* 127* 134* 102*    Rush Farmer, M.D. [facility] Pulmonary/Critical Care Medicine. Pager: [phone]. After hours pager: [phone].

## 2018-06-27 NOTE — Interval H&P Note (Signed)
History and Physical Interval Note:  06/27/2018 8:01 AM  [name]  has presented today for surgery, with the diagnosis of heart failure.  The various methods of treatment have been discussed with the patient and family. After consideration of risks, benefits and other options for treatment, the patient has consented to  Procedure(s): RIGHT HEART CATH (N/A) as a surgical intervention.  The patient's history has been reviewed, patient examined, no change in status, stable for surgery.  I have reviewed the patient's chart and labs.  Questions were answered to the patient's satisfaction.     Channing Yeager

## 2018-06-27 NOTE — Progress Notes (Signed)
Advanced Heart Failure Rounding Note   Subjective:     Remains on high flow O2. Sats low 90s. Pulmonary has seen and recommended aggressive pulmonary toilet. Question raised as to worsening PAH.   Denies SOB, orthopnea or PND. Back on oral torsemide. Weight stable 257 pounds  Objective:   Weight Range:  Vital Signs:   Temp:  [98 F (36.7 C)-99.8 F (37.7 C)] 98.5 F (36.9 C) (06/23 0720) Pulse Rate:  [83-96] 83 (06/23 0720) Resp:  [18-28] 26 (06/23 0720) BP: (92-132)/(64-77) 92/64 (06/23 0720) SpO2:  [87 %-96 %] 93 % (06/23 0720) FiO2 (%):  [55 %-100 %] 100 % (06/22 2041) Weight:  [116.6 kg] 116.6 kg (06/23 0500) Last BM Date: 06/24/18  Weight change: Filed Weights   06/25/18 0500 06/26/18 0500 06/27/18 0500  Weight: 119.5 kg 116.6 kg 116.6 kg    Intake/Output:   Intake/Output Summary (Last 24 hours) at 06/27/2018 0806 Last data filed at 06/26/2018 2221 Gross per 24 hour  Intake 896 ml  Output 1500 ml  Net -604 ml     Physical Exam: General:  Lying in bed on HFNC HEENT: normal Neck: supple. JVP hard to see Carotids 2+ bilat; no bruits. No lymphadenopathy or thryomegaly appreciated. Cor: PMI nondisplaced. Regular rate & rhythm. No rubs, gallops or murmurs. Lungs: clear crackles at the bases Abdomen: obese soft, nontender, nondistended. No hepatosplenomegaly. No bruits or masses. Good bowel sounds. Extremities: no cyanosis, clubbing, rash, edema + chronic venous stasis changes Neuro: alert & orientedx3, cranial nerves grossly intact. moves all 4 extremities w/o difficulty. Affect pleasant    Telemetry: NSR 90-100 Personally reviewed   Labs: Basic Metabolic Panel: Recent Labs  Lab 06/22/18 0619 06/23/18 0658 06/24/18 0836 06/25/18 0829 06/26/18 0839  NA 140 140 137 139 140  K 4.0 4.3 3.7 4.1 4.1  CL 96* 94* 96* 97* 96*  CO2 37* 36* 31 31 33*  GLUCOSE 117* 103* 112* 117* 123*  BUN _0 CREATININE 0.77 0.78 0.67 0.74 0.76  CALCIUM 8.7*  8.9 8.9 9.1 9.0  MG 1.9 2.0  --   --   --     Liver Function Tests: No results for input(s): AST, ALT, ALKPHOS, BILITOT, PROT, ALBUMIN in the last 168 hours. No results for input(s): LIPASE, AMYLASE in the last 168 hours. No results for input(s): AMMONIA in the last 168 hours.  CBC: Recent Labs  Lab 06/23/18 0658 06/26/18 0839  WBC 11.5* 10.8*  HGB 16.1* 15.7*  HCT 50.2* 49.8*  MCV 90.9 91.5  PLT 368 416*    Cardiac Enzymes: No results for input(s): CKTOTAL, CKMB, CKMBINDEX, TROPONINI in the last 168 hours.  BNP: BNP (last 3 results) Recent Labs    12/22/17 1247 06/15/18 0744  BNP 47.4 49.0    ProBNP (last 3 results) No results for input(s): PROBNP in the last 8760 hours.    Other results:  Imaging: Ct Chest Wo Contrast  Result Date: 06/25/2018 CLINICAL DATA:  Shortness of breath. EXAM: CT CHEST WITHOUT CONTRAST TECHNIQUE: Multidetector CT imaging of the chest was performed following the standard protocol without IV contrast. COMPARISON:  Chest radiograph 06/19/2018 FINDINGS: Cardiovascular: Right internal jugular approach central venous catheter terminates in the distal superior vena cava. The heart is enlarged. There is a pericardial effusion with maximum thickness of 9 mm at the base of the heart. Calcific atherosclerotic disease of the coronary arteries and aorta. Enlargement of the main pulmonary trunk, evidence of pulmonary arterial  hypertension. Mediastinum/Nodes: Numerous enlarged mediastinal lymph nodes with maximum short axis measurement of 18 mm. 2.1 cm soft tissue nodule within the isthmus of the thyroid gland. Lungs/Pleura: The major airways are patent. Patchy areas of airspace consolidation with lower lobe dependent predominance. Additional ground-glass opacities throughout the upper lung fields. Upper Abdomen: No acute abnormality. Musculoskeletal: Findings of diffuse idiopathic skeletal hyperostosis of the thoracic spine. IMPRESSION: 1. Confluent airspace  consolidation in the dependent portions of the lower lobes may represent multifocal pneumonia or subsegmental atelectasis. Additional scattered ground-glass opacities throughout the lung parenchyma, right greater than left, may represent asymmetric pulmonary edema or infectious consolidation. 2. Enlarged heart with small pericardial effusion. 3. Enlarged main pulmonary trunk, suggestive of pulmonary arterial hypertension. 4. 9 mm soft tissue nodule within the isthmus of the thyroid gland. Further evaluation with focused ultrasound may be considered. 5. Calcific atherosclerotic disease of the coronary arteries. Aortic Atherosclerosis (ICD10-I70.0). Electronically Signed   By: Fidela Salisbury M.D.   On: 06/25/2018 14:09     Medications:     Scheduled Medications:  gabapentin  600 mg Oral TID   insulin aspart  0-9 Units Subcutaneous TID WC   ipratropium-albuterol  3 mL Nebulization TID   pantoprazole  20 mg Oral BID   potassium chloride  40 mEq Oral TID   pravastatin  20 mg Oral Daily   rivaroxaban  20 mg Oral Q supper   sildenafil  20 mg Oral TID   spironolactone  25 mg Oral Daily   torsemide  60 mg Oral BID   vitamin B-12  500 mcg Oral Daily    Infusions:  sodium chloride 250 mL (06/18/18 0916)   treprostinil (REMODULIN) infusion 77 ng/kg/min (06/23/18 1749)    PRN Medications: sodium chloride, acetaminophen **OR** acetaminophen, albuterol, fluticasone, ondansetron **OR** ondansetron (ZOFRAN) IV   Assessment/Plan:   1. Acute on chronic hypoxic respiratory failure due to possible CAP - On admit CXR concerning for possible retrocardiac infiltrate. Completed azithromycin and ceftriaxone x 7 days  Finished 3 days vanc for cellulitis as well  - Remains on HFNC which is increased from baseline despite aggressive diuresis - CT chest confirms severe bibasilar atelectasis/consolidation - I think her obesity and passive atelectasis are major issues here. Needs to use  IS/flutter valve as instructed. Needs to get out of bed!!! - Appreciate Pulmonary recs. I have discussed RHC with her to reassess Pulmonary pressures and she has agreed to proceed. Plan RHC this am to re-evaluate and see if we can improve oxygenation by adjusting PAH meds (I think less likely but worth a shot) - Continue torsemide 60 bid - I think volume status about as good as we can get it. Weight and renal function stable   2. Possible RLE cellulitis -  Resolved. Received vanc x 3 days per IMTS  3. Acute Chronic diastolic HF/cor pulmonale:  - See discussion above  - Continue torsemide 60 bid - I think volume status about as good as we can get it. (was on torsemide 40 bid PTA) - need BMET this am  4. 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 However PAH far out of proportion to L-sided pressures or restrictive lung disease. - Follows with [facility]. Last seen 10/2017 - Remains on Remodulin & sildenafil  5. Paroxysmal atrial flutter.  -In Sinus - Continue Xarelto.No bleeding   6. Hypokalemia - Continue spiro 25. - BMET pending this am   Length of Stay: 12  Glori Bickers MD 06/27/2018, 8:06 AM  Advanced Heart Failure Team Pager [phone] (M-F; 7a - 4p)  Please contact [facility] for night-coverage after hours (4p -7a ) and weekends on [email or web address]

## 2018-06-27 NOTE — Interval H&P Note (Signed)
History and Physical Interval Note:  06/27/2018 9:46 AM  [name]  has presented today for surgery, with the diagnosis of heart failure.  The various methods of treatment have been discussed with the patient and family. After consideration of risks, benefits and other options for treatment, the patient has consented to  Procedure(s): RIGHT HEART CATH (N/A) as a surgical intervention.  The patient's history has been reviewed, patient examined, no change in status, stable for surgery.  I have reviewed the patient's chart and labs.  Questions were answered to the patient's satisfaction.     [name]

## 2018-06-27 NOTE — Progress Notes (Addendum)
   Subjective: HD#12   Overnight: No acute overnight events reported  Today, [name] was examined at bedside following her right heart cath procedure.  She tolerated the procedure very well but states "it was painful."She denies shortness of breath, orthopnea.  Objective:  Vital signs in last 24 hours: Vitals:   06/26/18 2309 06/26/18 2330 06/27/18 0405 06/27/18 0500  BP: 94/72 104/65 131/68   Pulse: 92 91 89   Resp: 18 (!) 28 (!) 22   Temp:  98.4 F (36.9 C) 98 F (36.7 C)   TempSrc:  Oral Oral   SpO2: 92% 96% 96%   Weight:    116.6 kg  Height:       Const: In no apparent distress, lying comfortably in bed, conversational Resp: Bibasilar crackles improved, SPO2 90-96% on 30 L HF  CV: RRR, no murmurs, gallop, rub Ext: Right groin catheter insertion site is clean without evidence of hematoma.  Assessment/Plan:  Principal Problem:   CAP (community acquired pneumonia) Active Problems:   Obstructive sleep apnea   Diabetes ([place])   Right heart failure, NYHA class 3 (HCC)   Pulmonary hypertension (HCC)   Acute on chronic diastolic (congestive) heart failure (HCC)   Acute on chronic respiratory failure (HCC)   PAH (pulmonary artery hypertension) (HCC)  [name] is a 58 year old female with a past medical history notable for PAH, CHF, paroxysmal atrial flutter who was admitted for acute on chronic hypoxic respiratory failure secondary to Pneumonia. Admission has been complicated by her increasingrequirement for supplemental oxygen. Currently weaning supplemental oxygen has been impossible due to persistent hypoxia.  A/P: #Acute on chronic hypoxia respiratory failure #Pulmonary hypertension, cor pulmonale #Chronic diastolic heart failure #Community-acquired pneumonia(retrocardiac) Underwent RHC today which showed worsening pulmonary pressures about 10 mmHg higher than previous.  She was evaluated by pulmonology yesterday who encouraged aggressive pulmonary  toilet, ambulation, out of bed to chair.  They did agree that this is a challenging case given the lack of improvement since admission.  Her weight is stable at 116 kg, which was the same as yesterday.  We do appreciate assistance of cardiology and pulmonology.  She did work with physical therapy yesterday and tolerated standing marches for about 3 minutes however oxygen drops to 80-85% on 30 L high flow nasal cannula and 74% on 25% high flow nasal cannula during the last session.  The plan from pulmonary is to transition from the high flow nasal cannula to a regular sized nasal cannula and titrate down oxygen.  So far her work-up for autoimmune pulmonary process have been unremarkable given negative ANA, SCL 70. -Increase Remodulin infusion -Continue torsemide 60 mg BID -Continue spironolactone16m daily -Continue sildenafil -Scheduled Duoneb TID -Continue aggressive pulmonary toilet -Continue strict I's and O's -Continue daily weights  Hyperkalemia: K+ of 4.9 this a.m., will hold potassium supplementation -Continue to monitor  Code: Full Diet: Heart healthy carb modified VTE prophylaxis: Xarelto Dispo: Anticipated discharge in approximatelyhowever any days and until medically stable.  AJean Rosenthal MD 06/27/2018, 6:07 AM Pager: [phone] Medicine Teaching Service

## 2018-06-28 LAB — BASIC METABOLIC PANEL
Anion gap: 8 (ref 5–15)
BUN: 20 mg/dL (ref 6–20)
CO2: 37 mmol/L — ABNORMAL HIGH (ref 22–32)
Calcium: 8.8 mg/dL — ABNORMAL LOW (ref 8.9–10.3)
Chloride: 93 mmol/L — ABNORMAL LOW (ref 98–111)
Creatinine, Ser: 0.91 mg/dL (ref 0.44–1.00)
GFR calc Af Amer: >60 mL/min (ref >60)
GFR calc non Af Amer: >60 mL/min (ref >60)
Glucose, Bld: 119 mg/dL — ABNORMAL HIGH (ref 70–99)
Potassium: 3.5 mmol/L (ref 3.5–5.1)
Sodium: 138 mmol/L (ref 135–145)

## 2018-06-28 LAB — GLUCOSE, CAPILLARY
Glucose-Capillary: 94 mg/dL (ref 70–99)
Glucose-Capillary: 104 mg/dL — ABNORMAL HIGH (ref 70–99)
Glucose-Capillary: 180 mg/dL — ABNORMAL HIGH (ref 70–99)
Glucose-Capillary: 114 mg/dL — ABNORMAL HIGH (ref 70–99)

## 2018-06-28 LAB — ANCA TITERS
Atypical P-ANCA titer: <1:20 {titer}
C-ANCA: <1:20 {titer}
P-ANCA: 1:160 {titer} — ABNORMAL HIGH

## 2018-06-28 LAB — RHEUMATOID FACTOR: Rheumatoid fact SerPl-aCnc: 11.8 IU/mL (ref 0.0–13.9)

## 2018-06-28 MED ORDER — SODIUM CHLORIDE 0.9% FLUSH
3.0000 mL | Freq: Two times a day (BID) | INTRAVENOUS | Status: DC
  Administered 2018-06-28 – 2018-07-03 (×10): 3 mL via INTRAVENOUS

## 2018-06-28 MED ORDER — POTASSIUM CHLORIDE CRYS ER 20 MEQ PO TBCR
40.0000 meq | EXTENDED_RELEASE_TABLET | Freq: Three times a day (TID) | ORAL | Status: DC
  Administered 2018-06-28 – 2018-07-03 (×17): 40 meq via ORAL
  Filled 2018-06-28 (×17): qty 2

## 2018-06-28 MED ORDER — HYDRALAZINE HCL 20 MG/ML IJ SOLN: 10.0000 mg | INTRAMUSCULAR | Status: AC | PRN

## 2018-06-28 MED ORDER — SODIUM CHLORIDE 0.9% FLUSH: 3.0000 mL | INTRAVENOUS | Status: DC | PRN

## 2018-06-28 MED ORDER — SODIUM CHLORIDE 0.9 % IV SOLN: 250.0000 mL | INTRAVENOUS | Status: DC | PRN

## 2018-06-28 MED ORDER — LABETALOL HCL 5 MG/ML IV SOLN: 10.0000 mg | INTRAVENOUS | Status: AC | PRN

## 2018-06-28 MED ORDER — ONDANSETRON HCL 4 MG/2ML IJ SOLN: 4.0000 mg | Freq: Four times a day (QID) | INTRAMUSCULAR | Status: DC | PRN

## 2018-06-28 MED ORDER — ACETAMINOPHEN 325 MG PO TABS
650.0000 mg | ORAL_TABLET | ORAL | Status: DC | PRN
  Administered 2018-06-28 – 2018-07-01 (×3): 650 mg via ORAL
  Filled 2018-06-28 (×3): qty 2

## 2018-06-28 NOTE — Progress Notes (Addendum)
Advanced Heart Failure Rounding Note   Subjective:    Remains on high flow O2. Sats low 80-90s.   RHC on 6/23 with PAPs 89 (up from previous 78) and PCWP 11.   I d/w Dr. Gilles Chiquito at [facility] and decision made to increase remodulin but we had to change mix concentration as pump will only handle 40m/24hours. We changed concentration yesterday but she refused dose titration. Agrees to dose titration today.   Remains on po diuretics. Weight down 3 pounds. Groin site stable No CBC today   Objective:   Weight Range:  Vital Signs:   Temp:  [98.3 F (36.8 C)-99.1 F (37.3 C)] 98.4 F (36.9 C) (06/24 0751) Pulse Rate:  [79-102] 90 (06/24 0751) Resp:  [22-25] 23 (06/24 0751) BP: (101-117)/(64-80) 101/64 (06/24 0751) SpO2:  [86 %-97 %] 86 % (06/24 0751) FiO2 (%):  [100 %] 100 % (06/23 1438) Weight:  [115.6 kg] 115.6 kg (06/24 0500) Last BM Date: 06/24/18  Weight change: Filed Weights   06/26/18 0500 06/27/18 0500 06/28/18 0500  Weight: 116.6 kg 116.6 kg 115.6 kg    Intake/Output:   Intake/Output Summary (Last 24 hours) at 06/28/2018 0828 Last data filed at 06/28/2018 0612 Gross per 24 hour  Intake 800 ml  Output 4400 ml  Net -3600 ml     Physical Exam: General:  Lying in bed on HFNC HEENT: normal + flushing Neck: supple. no obvious JVD. Carotids 2+ bilat; no bruits. No lymphadenopathy or thryomegaly appreciated. Cor: PMI nondisplaced. Regular rate & rhythm. No rubs, gallops or murmurs. Lungs: clear with crackles in bases Abdomen: obese soft, nontender, nondistended. No hepatosplenomegaly. No bruits or masses. Good bowel sounds. Extremities: no cyanosis, clubbing, rash, edema chronic venous stasis changes Neuro: alert & orientedx3, cranial nerves grossly intact. moves all 4 extremities w/o difficulty. Affect pleasant   Telemetry: NSR 90-100 Personally reviewed   Labs: Basic Metabolic Panel: Recent Labs  Lab 06/22/18 0619 06/23/18 0658 06/24/18 0836 06/25/18 0829  06/26/18 0839 06/27/18 1014 06/27/18 1015 06/27/18 1018 06/27/18 1108 06/28/18 0629  NA 140 140 137 139 140 141 141 143 137 138  K 4.0 4.3 3.7 4.1 4.1 3.6 3.3* 3.5 4.9 3.5  CL 96* 94* 96* 97* 96*  --   --   --  95* 93*  CO2 37* 36* 31 31 33*  --   --   --  30 37*  GLUCOSE 117* 103* 112* 117* 123*  --   --   --  99 119*  BUN _0 --   --   --  17 20  CREATININE 0.77 0.78 0.67 0.74 0.76  --   --   --  0.68 0.91  CALCIUM 8.7* 8.9 8.9 9.1 9.0  --   --   --  8.9 8.8*  MG 1.9 2.0  --   --   --   --   --   --   --   --     Liver Function Tests: No results for input(s): AST, ALT, ALKPHOS, BILITOT, PROT, ALBUMIN in the last 168 hours. No results for input(s): LIPASE, AMYLASE in the last 168 hours. No results for input(s): AMMONIA in the last 168 hours.  CBC: Recent Labs  Lab 06/23/18 0658 06/26/18 0839 06/27/18 1014 06/27/18 1015 06/27/18 1018  WBC 11.5* 10.8*  --   --   --   HGB 16.1* 15.7* 17.0* 16.3* 16.7*  HCT 50.2* 49.8* 50.0* 48.0* 49.0*  MCV  90.9 91.5  --   --   --   PLT 368 416*  --   --   --     Cardiac Enzymes: No results for input(s): CKTOTAL, CKMB, CKMBINDEX, TROPONINI in the last 168 hours.  BNP: BNP (last 3 results) Recent Labs    12/22/17 1247 06/15/18 0744  BNP 47.4 49.0    ProBNP (last 3 results) No results for input(s): PROBNP in the last 8760 hours.    Other results:  Imaging: No results found.   Medications:     Scheduled Medications: . gabapentin  600 mg Oral TID  . insulin aspart  0-9 Units Subcutaneous TID WC  . ipratropium-albuterol  3 mL Nebulization TID  . pantoprazole  20 mg Oral BID  . pravastatin  20 mg Oral Daily  . rivaroxaban  20 mg Oral Q supper  . sildenafil  20 mg Oral TID  . sodium chloride flush  3 mL Intravenous Q12H  . spironolactone  25 mg Oral Daily  . torsemide  60 mg Oral BID  . vitamin B-12  500 mcg Oral Daily    Infusions: . sodium chloride 250 mL (06/18/18 0916)  . sodium chloride    .  treprostinil (REMODULIN) infusion 76.806 ng/kg/min (06/27/18 1944)    PRN Medications: sodium chloride, sodium chloride, acetaminophen, albuterol, fluticasone, hydrALAZINE, labetalol, ondansetron **OR** ondansetron (ZOFRAN) IV, ondansetron (ZOFRAN) IV, sodium chloride flush   Assessment/Plan:   1. Acute on chronic hypoxic respiratory failure due to possible CAP - On admit CXR concerning for possible retrocardiac infiltrate. Completed azithromycin and ceftriaxone x 7 days  Finished 3 days vanc for cellulitis as well  - Remains on HFNC which is increased from baseline despite aggressive diuresis - CT chest confirms severe bibasilar atelectasis/consolidation - I think her obesity and passive atelectasis are major issues here. Needs to use IS/flutter valve as instructed. Needs to get out of bed!!! - RHC on 6/23 with PAPs 89 (up from previous 78) and PCWP 11. I d/w Dr. Gilles Chiquito at [facility] and decision made to increase remodulin but we had to change mix concentration as pump will only handle 80m/24hours. We changed concentration yesterday but she refused dose titration. Agrees to dose titration today. Will increase to 34m24hr which will result in increase 77ng/kg/min -> 79ng/kg/min - Continue torsemide 60 bid -PCPW 11. Volume status is as good as we can get it. Weight and renal function stable   2. Possible RLE cellulitis -  Resolved. Received vanc x 3 days per IMTS  3. Acute Chronic diastolic HF/cor pulmonale:  - See discussion above  - Continue torsemide 60 bid  - Follow BMET  4. 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 However PAH far out of proportion to L-sided pressures or restrictive lung disease. - [facility] Remains on Remodulin & sildenafil - See discussion above. Titrate remodulin as side effects allow   5. Paroxysmal atrial flutter.  -In Sinus - Continue Xarelto.No bleeding   6. Hypokalemia -  Continue spiro 25. - K 3.5  Will supp    Length of Stay: 13   Naarah Borgerding MD 06/28/2018, 8:28 AM  Advanced Heart Failure Team Pager [phone]-F; [place] Please contact [facility] for night-coverage after hours (4p -7a ) and weekends on [email or web address]

## 2018-06-28 NOTE — Progress Notes (Signed)
Pts cath site in R groin is level 0 throughout the whole shift. A new cartridge of medication was added last night to pts pump by pharmacist and asked RN to scan. Dose that RX stated was not what was ordered, pt was questioning why it was not increased and Pilar Plate stated that they would sort it out today.

## 2018-06-28 NOTE — Progress Notes (Signed)
   Subjective: HD#13   Overnight: No acute overnight events reported  Today, [name] was examined at bedside and reports that she feels more tired today than previously.  She did feel better halfway during our assessment.  She denies shortness of breath and tolerating the recent increase in Remodulin infusion.  Objective:  Vital signs in last 24 hours: Vitals:   06/27/18 2000 06/27/18 2300 06/27/18 2346 06/28/18 0500  BP: 112/77 115/80    Pulse: (!) 102 100 98   Resp: (!) 22     Temp:  98.3 F (36.8 C)    TempSrc:  Oral    SpO2: (!) 87% (!) 86% 97%   Weight:    115.6 kg  Height:       Const: In no apparent distress, lying comfortably in bed, conversational Ext: No lower extremity edema, skin is warm to touch  Assessment/Plan:  Principal Problem:   Acute on chronic respiratory failure (HCC) Active Problems:   Obstructive sleep apnea   Obesity   Diabetes (HCC)   Acute on chronic diastolic (congestive) heart failure (HCC)   CAP (community acquired pneumonia)   PAH (pulmonary artery hypertension) ([place])   Atelectasis  [name] is a 58 year old female with a past medical history notable for PAH, CHF, paroxysmal atrial flutter who was admitted for acute on chronic hypoxic respiratory failure secondary to Pneumonia. Admission has been complicated by her increasingrequirement for supplemental oxygen. Currently weaning supplemental oxygen has been impossible due to persistent hypoxia.  A/P: #Acute on chronic hypoxia respiratory failure #Pulmonary hypertension, cor pulmonale #Chronic diastolic heart failure #Community-acquired pneumonia(retrocardiac) Yesterday she was transitioned to 10 L high flow nasal cannula and was saturating at 90%.  She continued to wean down to 6 L SPO2 was 89%.  This is much improvement than previously.  ABG results are as follows 7.34/50.7/54 indicating chronic hypoxemia and hypercarbia compensated with metabolic alkalosis as her BMP  showed an increased bicarb of 37.  We will continue to wean down supplemental oxygen to maintain SPO2 above 85%.  In addition, we will continue to encourage ambulation, aggressive pulmonary toilet.  During our assessment this morning, we weaned her down to 7 L and she was maintaining SPO2 above 85%.  Her weight is down about 3 pounds from yesterday.  We advised her to continue using incentive spirometer, flutter valve, ambulate and out of bed to chair. -ContinueRemodulininfusion -Continuetorsemide 60 mgBID -Continue spironolactone67m daily -Continue sildenafil -ScheduledDuoneb TID -Continue aggressive pulmonary toilet -Continue strict I's and O's -Continue daily weights  Code: Full Diet: Heart healthy carb modified VTE prophylaxis: Xarelto Dispo: Anticipated discharge in approximatelyhowever any days and until medically stable.  AJean Rosenthal MD 06/28/2018, 6:11 AM Pager: [phone] Medicine Teaching Service

## 2018-06-28 NOTE — Progress Notes (Signed)
Occupational Therapy Treatment Patient Details Name: [name] MRN: [id number] DOB: [birth year] Today's Date: 06/28/2018    History of present illness 58 y.o. female admitted on 06/15/18 for chills and productive cough.  Tested for COVID and was (-).  Dx with CAP, pulmonary HTN, cor pulmonale, chronic diastolic heart failure, acute on chronic hypoxic respiratory failure, and PAF.  Pt with other significant PMH of OSA, HTN, baseline mental disability, DM, COPD, anemia.     OT comments  Pt progressing towards acute OT goals. Focus of session was functional mobility in the room and grooming/UB bathing tasks at sink. Pt able to stand for about the first 5 minutes to complete hair washing with shower cap before needing to sit down for remainder of tasks. Pt with O2 sats mostly in the 80s, occasional dip into upper 70s, HR 100-110. Pt received and left on 10L HLNC, did bump pt up to 15L with OOB activity with little observable change in sats. D/c plan remains appropriate.    Follow Up Recommendations  Home health OT;Supervision/Assistance - 24 hour    Equipment Recommendations  None recommended by OT    Recommendations for Other Services      Precautions / Restrictions Precautions Precautions: Other (comment) Precaution Comments: monitor sats Restrictions Weight Bearing Restrictions: No       Mobility Bed Mobility Overal bed mobility: Modified Independent Bed Mobility: Supine to Sit     Supine to sit: Min guard     General bed mobility comments: Pt able to get EOB with use of bedrail and HOB elevated ~40 degrees  Transfers Overall transfer level: Needs assistance Equipment used: Rolling walker (2 wheeled) Transfers: Sit to/from [facility] Sit to Stand: Min guard;Supervision Stand pivot transfers: Supervision       General transfer comment: Min guard assist for safety and line management.     Balance Overall balance assessment: Needs  assistance Sitting-balance support: Feet supported;No upper extremity supported Sitting balance-Leahy Scale: Good     Standing balance support: Bilateral upper extremity supported Standing balance-Leahy Scale: Fair Standing balance comment: able to preform her own peri care with supervision.                            ADL either performed or assessed with clinical judgement   ADL Overall ADL's : Needs assistance/impaired     Grooming: Wash/dry hands;Oral care;Brushing hair;Set up;Min guard;Sitting;Standing Grooming Details (indicate cue type and reason): completed about 5 minutes in standing before needing seated rest break Upper Body Bathing: Set up;Min guard;Sitting;Standing Upper Body Bathing Details (indicate cue type and reason): initially stood to manage shower cap to wash hair. Pt cued to not hold breath. Pt removed  to complete hair care. O2 sats dropped with corresponding dizziness. Pt recovered with seated rest break and time                         Functional mobility during ADLs: Min guard;Rolling walker General ADL Comments: Pt completed in room functional mobility and grooming/UB bathing tasks in standing then sitting positions.     Vision       Perception     Praxis      Cognition Arousal/Alertness: Awake/alert Behavior During Therapy: WFL for tasks assessed/performed Overall Cognitive Status: History of cognitive impairments - at baseline  General Comments: h/o cognitive delay        Exercises Exercises: General Upper Extremity;General Lower Extremity General Exercises - Upper Extremity Shoulder Flexion: AROM;Both;10 reps Elbow Flexion: AROM;Both;10 reps General Exercises - Lower Extremity Long Arc Quad: AROM;Both;10 reps Hip Flexion/Marching: AROM;Both;10 reps Toe Raises: AROM;Both;10 reps Heel Raises: AROM;Both;10 reps Other Exercises Other Exercises: IS x 10 with max volume of  750 mL, flutter valve x 10, encouraged 10 of each an hour.    Shoulder Instructions       General Comments      Pertinent Vitals/ Pain       Pain Assessment: Faces Faces Pain Scale: Hurts little more Pain Location: headache Pain Descriptors / Indicators: Aching Pain Intervention(s): Monitored during session;Patient requesting pain meds-RN notified;RN gave pain meds during session  Home Living                                          Prior Functioning/Environment              Frequency  Min 3X/week        Progress Toward Goals  OT Goals(current goals can now be found in the care plan section)  Progress towards OT goals: Progressing toward goals  Acute Rehab OT Goals Patient Stated Goal: to get back home to her prior level of independence OT Goal Formulation: With patient Time For Goal Achievement: 07/07/18 Potential to Achieve Goals: Good ADL Goals Pt Will Perform Grooming: with modified independence;standing Pt Will Perform Upper Body Bathing: with modified independence;sitting Pt Will Perform Lower Body Bathing: with modified independence;sit to/from stand Pt Will Perform Upper Body Dressing: with modified independence;sitting Pt Will Perform Lower Body Dressing: with modified independence;sit to/from stand Pt Will Transfer to Toilet: with modified independence;ambulating;regular height toilet;bedside commode;grab bars Pt Will Perform Toileting - Clothing Manipulation and hygiene: with modified independence;sit to/from stand Pt/caregiver will Perform Home Exercise Program: Increased ROM;Increased strength;With theraband;With written HEP provided;Independently Additional ADL Goal #1: Pt will actively participate in 10 mins therapeutic activity with no more than 3 seated rest breaks and 02 sats >92%  Plan Discharge plan remains appropriate    Co-evaluation                 AM-PAC OT "6 Clicks" Daily Activity     Outcome Measure   Help  from another person eating meals?: None Help from another person taking care of personal grooming?: None Help from another person toileting, which includes using toliet, bedpan, or urinal?: A Little Help from another person bathing (including washing, rinsing, drying)?: A Little Help from another person to put on and taking off regular upper body clothing?: None Help from another person to put on and taking off regular lower body clothing?: A Little 6 Click Score: 21    End of Session Equipment Utilized During Treatment: Rolling walker;Oxygen  OT Visit Diagnosis: Unsteadiness on feet (R26.81)   Activity Tolerance Patient tolerated treatment well;Other (comment)(O2 sats 72-92; HR 100-110)   Patient Left in chair;with call bell/phone within reach   Nurse Communication Other (comment)(O2 sats)        Time: 2589-4834 OT Time Calculation (min): 41 min  Charges: OT General Charges $OT Visit: 1 Visit OT Treatments $Self Care/Home Management : 38-52 mins  Tyrone Schimke, OT Acute Rehabilitation Services Pager: [phone] Office: [phone]    Hortencia Pilar 06/28/2018, 1:41 PM

## 2018-06-28 NOTE — Progress Notes (Signed)
Physical Therapy Treatment Patient Details Name: [name] MRN: [id number] DOB: 07/06/1960 Today's Date: 06/28/2018    History of Present Illness 58 y.o. female admitted on 06/15/18 for chills and productive cough.  Tested for COVID and was (-).  Dx with CAP, pulmonary HTN, cor pulmonale, chronic diastolic heart failure, acute on chronic hypoxic respiratory failure, and PAF.  Pt with other significant PMH of OSA, HTN, baseline mental disability, DM, COPD, anemia.      PT Comments    Patient doing well today with therapy, worked with sit to stands and short distance gait. Desat to 80% on 10L today, returns with rest. Pt denies SOB with minimal activity. Cont to rec HHPT.     Follow Up Recommendations  Home health PT;Supervision - Intermittent     Equipment Recommendations  Other (comment)(interested in getting more O2 tubing for home)    Recommendations for Other Services       Precautions / Restrictions Precautions Precautions: Other (comment) Precaution Comments: monitor sats Restrictions Weight Bearing Restrictions: No    Mobility  Bed Mobility Overal bed mobility: Modified Independent Bed Mobility: Supine to Sit     Supine to sit: Min guard     General bed mobility comments: Pt able to get EOB with use of bedrail and HOB elevated ~40 degrees  Transfers Overall transfer level: Needs assistance Equipment used: Rolling walker (2 wheeled) Transfers: Sit to/from [facility] Sit to Stand: Min guard;Supervision Stand pivot transfers: Supervision       General transfer comment: Min guard assist for safety and line management.   Ambulation/Gait Ambulation/Gait assistance: Supervision Gait Distance (Feet): 15 Feet Assistive device: Rolling walker (2 wheeled) Gait Pattern/deviations: Step-to pattern     General Gait Details: Pt continues to be on 30 L HFNC, unable to ambulate, so we continue to march in place until fatitgue or O2 sat drop into  the 70s.  Pt was able to march in place today for 3 mins before fatigue.  She stayed 80-85% on 30 L HFNC.  Recovery time was 2 mins. Repeated x 2   Stairs             Wheelchair Mobility    Modified Rankin (Stroke Patients Only)       Balance Overall balance assessment: Needs assistance Sitting-balance support: Feet supported;No upper extremity supported Sitting balance-Leahy Scale: Good     Standing balance support: Bilateral upper extremity supported Standing balance-Leahy Scale: Fair Standing balance comment: able to preform her own peri care with supervision.                             Cognition Arousal/Alertness: Awake/alert Behavior During Therapy: WFL for tasks assessed/performed Overall Cognitive Status: History of cognitive impairments - at baseline                                 General Comments: h/o cognitive delay      Exercises General Exercises - Upper Extremity Shoulder Flexion: AROM;Both;10 reps Elbow Flexion: AROM;Both;10 reps General Exercises - Lower Extremity Long Arc Quad: AROM;Both;10 reps Hip Flexion/Marching: AROM;Both;10 reps Toe Raises: AROM;Both;10 reps Heel Raises: AROM;Both;10 reps Other Exercises Other Exercises: IS x 10 with max volume of 750 mL, flutter valve x 10, encouraged 10 of each an hour.     General Comments        Pertinent Vitals/Pain Pain Assessment: Faces  Faces Pain Scale: Hurts little more Pain Location: headache Pain Descriptors / Indicators: Aching Pain Intervention(s): Monitored during session;Patient requesting pain meds-RN notified;RN gave pain meds during session    Home Living                      Prior Function            PT Goals (current goals can now be found in the care plan section) Acute Rehab PT Goals Patient Stated Goal: to get back home to her prior level of independence PT Goal Formulation: With patient Time For Goal Achievement: 07/06/18 Potential  to Achieve Goals: Good Progress towards PT goals: Progressing toward goals    Frequency    Min 3X/week      PT Plan Current plan remains appropriate    Co-evaluation              AM-PAC PT "6 Clicks" Mobility   Outcome Measure  Help needed turning from your back to your side while in a flat bed without using bedrails?: A Little Help needed moving from lying on your back to sitting on the side of a flat bed without using bedrails?: A Little Help needed moving to and from a bed to a chair (including a wheelchair)?: A Little Help needed standing up from a chair using your arms (e.g., wheelchair or bedside chair)?: A Little Help needed to walk in hospital room?: A Little Help needed climbing 3-5 steps with a railing? : A Little 6 Click Score: 18    End of Session Equipment Utilized During Treatment: Oxygen(30 L HFNC) Activity Tolerance: Patient tolerated treatment well Patient left: in chair;with call bell/phone within reach   PT Visit Diagnosis: Difficulty in walking, not elsewhere classified (R26.2)     Time: 4753-3917 PT Time Calculation (min) (ACUTE ONLY): 19 min  Charges:  $Therapeutic Activity: 8-22 mins                    Reinaldo Berber, PT, DPT Acute Rehabilitation Services Pager: [phone] Office: [phone]     Reinaldo Berber 06/28/2018, 1:06 PM

## 2018-06-29 DIAGNOSIS — J9622 Acute and chronic respiratory failure with hypercapnia: Secondary | ICD-10-CM

## 2018-06-29 LAB — BASIC METABOLIC PANEL
Anion gap: 10 (ref 5–15)
BUN: 19 mg/dL (ref 6–20)
CO2: 34 mmol/L — ABNORMAL HIGH (ref 22–32)
Calcium: 8.9 mg/dL (ref 8.9–10.3)
Chloride: 94 mmol/L — ABNORMAL LOW (ref 98–111)
Creatinine, Ser: 0.82 mg/dL (ref 0.44–1.00)
GFR calc Af Amer: >60 mL/min (ref >60)
GFR calc non Af Amer: >60 mL/min (ref >60)
Glucose, Bld: 120 mg/dL — ABNORMAL HIGH (ref 70–99)
Potassium: 3.7 mmol/L (ref 3.5–5.1)
Sodium: 138 mmol/L (ref 135–145)

## 2018-06-29 LAB — GLUCOSE, CAPILLARY
Glucose-Capillary: 108 mg/dL — ABNORMAL HIGH (ref 70–99)
Glucose-Capillary: 169 mg/dL — ABNORMAL HIGH (ref 70–99)
Glucose-Capillary: 122 mg/dL — ABNORMAL HIGH (ref 70–99)
Glucose-Capillary: 175 mg/dL — ABNORMAL HIGH (ref 70–99)

## 2018-06-29 LAB — CBC
HCT: 46.4 % — ABNORMAL HIGH (ref 36.0–46.0)
Hemoglobin: 14.7 g/dL (ref 12.0–15.0)
MCH: 28.7 pg (ref 26.0–34.0)
MCHC: 31.7 g/dL (ref 30.0–36.0)
MCV: 90.6 fL (ref 80.0–100.0)
Platelets: 449 10*3/uL — ABNORMAL HIGH (ref 150–400)
RBC: 5.12 MIL/uL — ABNORMAL HIGH (ref 3.87–5.11)
RDW: 17 % — ABNORMAL HIGH (ref 11.5–15.5)
WBC: 10.2 10*3/uL (ref 4.0–10.5)
nRBC: 0 % (ref 0.0–0.2)

## 2018-06-29 NOTE — Progress Notes (Signed)
   Subjective: HD#14   Overnight: No acute events reported  Today, [name] was examined at bedside and was found eating breakfast.  She states that she is gradually feeling back to her baseline.  She did work with physical therapy yesterday and tolerated her session well.  She was able to ambulate from her bed to the door.  She states she only had desaturation of 80% because her oxygen was off at that time.  Objective:  Vital signs in last 24 hours: Vitals:   06/28/18 2218 06/28/18 2302 06/29/18 0357 06/29/18 0427  BP:  118/68 119/70   Pulse: 93 95 94   Resp: (!) 22 20 (!) 21   Temp:  98.5 F (36.9 C) 98.5 F (36.9 C)   TempSrc:  Oral    SpO2: 93% 92% 94%   Weight:    115.6 kg  Height:       Const: In no apparent distress, lying comfortably in bed, conversational Resp: Mild bibasilar crackles, SPO2 90% on 7 L nasal cannula CV: RRR, no murmurs, gallop, rub  Assessment/Plan:  Principal Problem:   Acute on chronic respiratory failure (HCC) Active Problems:   Obstructive sleep apnea   Obesity   Diabetes (HCC)   Acute on chronic diastolic (congestive) heart failure (HCC)   CAP (community acquired pneumonia)   PAH (pulmonary artery hypertension) ([place])   Atelectasis  [name] is a 58 year old female with a past medical history notable for PAH, CHF, paroxysmal atrial flutter who was admitted for acute on chronic hypoxic respiratory failure secondary to Pneumonia. Admission has been complicated by her increasingrequirement for supplemental oxygen. Currently weaning supplemental oxygen has been impossible due to persistent hypoxia.  A/P: #Acute on chronic hypoxia respiratory failure #Pulmonary hypertension, cor pulmonale #Chronic diastolic heart failure #Community-acquired pneumonia(retrocardiac) She seems to be heading in the right direction from a respiratory standpoint.  He is also tolerating recent increase in Remodulin dosage infusion.  Her session with  physical therapy was uneventful and she did ambulate from bed to the door however required to increase in oxygen level up to 10 L.  She states her desaturation episode was due to the fact that her supplemental oxygen was off.  During our evaluation this morning, SPO2 was 90% on 7 L nasal cannula.  I did speak to nursing staff to gradually wean as tolerated.  From a cardiology standpoint, the plan is to increase remodeling today and if she tolerates this, this will be her discharge dose.  However if unable to tolerate increased remodeling dose, it will be decreased as needed. She is to AMBULATE and OOB 3x/day.  -ContinueRemodulininfusion -Continuetorsemide 60 mgBID -Continue spironolactone78m daily -Continue sildenafil -ScheduledDuoneb TID -Continue aggressive pulmonary toilet -Continue strict I's and O's -Continue daily weights  Code: Full Diet: Heart healthy carb modified VTE prophylaxis: Xarelto Dispo: Anticipated discharge in approximatelyhowever any days and until medically stable.  Faith Rosenthal MD 06/29/2018, 6:12 AM Pager: [phone] Medicine Teaching Service

## 2018-06-29 NOTE — Progress Notes (Signed)
Advanced Heart Failure Rounding Note   Subjective:    Feels better today. Less SOB. O2 requirements down some.   Remodulin increased yesterday and tolerated well. Denies n/v/diarrhea, jaw claudication  Objective:   Weight Range:  Vital Signs:   Temp:  [97.4 F (36.3 C)-98.5 F (36.9 C)] 98.5 F (36.9 C) (06/25 0357) Pulse Rate:  [88-143] 94 (06/25 0357) Resp:  [16-24] 21 (06/25 0357) BP: (101-119)/(63-75) 119/70 (06/25 0357) SpO2:  [86 %-96 %] 94 % (06/25 0357) FiO2 (%):  [93 %] 93 % (06/24 1429) Weight:  [115.6 kg] 115.6 kg (06/25 0427) Last BM Date: 06/24/18  Weight change: Filed Weights   06/27/18 0500 06/28/18 0500 06/29/18 0427  Weight: 116.6 kg 115.6 kg 115.6 kg    Intake/Output:   Intake/Output Summary (Last 24 hours) at 06/29/2018 8338 Last data filed at 06/29/2018 0300 Gross per 24 hour  Intake 200 ml  Output 400 ml  Net -200 ml     Physical Exam: General:  Lying in bed on CPAP HEENT: normal + flushing Neck: supple. no JVD. Carotids 2+ bilat; no bruits. No lymphadenopathy or thryomegaly appreciated. Cor: PMI nondisplaced. Regular rate & rhythm. No rubs, gallops or murmurs. Lungs: clear Abdomen: obese soft, nontender, nondistended. No hepatosplenomegaly. No bruits or masses. Good bowel sounds. Extremities: no cyanosis, clubbing, rash, edema + venous stasis changes Neuro: alert & orientedx3, cranial nerves grossly intact. moves all 4 extremities w/o difficulty. Affect pleasant    Telemetry: NSR 90s Personally reviewed   Labs: Basic Metabolic Panel: Recent Labs  Lab 06/22/18 0619 06/23/18 0658 06/24/18 0836 06/25/18 0829 06/26/18 0839 06/27/18 1014 06/27/18 1015 06/27/18 1018 06/27/18 1108 06/28/18 0629  NA _0  K 4.0 4.3 3.7 4.1 4.1 3.6 3.3* 3.5 4.9 3.5  CL 96* 94* 96* 97* 96*  --   --   --  95* 93*  CO2 37* 36* 31 31 33*  --   --   --  30 37*  GLUCOSE 117* 103* 112* 117* 123*  --   --   --  99  119*  BUN _1 --   --   --  17 20  CREATININE 0.77 0.78 0.67 0.74 0.76  --   --   --  0.68 0.91  CALCIUM 8.7* 8.9 8.9 9.1 9.0  --   --   --  8.9 8.8*  MG 1.9 2.0  --   --   --   --   --   --   --   --     Liver Function Tests: No results for input(s): AST, ALT, ALKPHOS, BILITOT, PROT, ALBUMIN in the last 168 hours. No results for input(s): LIPASE, AMYLASE in the last 168 hours. No results for input(s): AMMONIA in the last 168 hours.  CBC: Recent Labs  Lab 06/23/18 0658 06/26/18 0839 06/27/18 1014 06/27/18 1015 06/27/18 1018  WBC 11.5* 10.8*  --   --   --   HGB 16.1* 15.7* 17.0* 16.3* 16.7*  HCT 50.2* 49.8* 50.0* 48.0* 49.0*  MCV 90.9 91.5  --   --   --   PLT 368 416*  --   --   --     Cardiac Enzymes: No results for input(s): CKTOTAL, CKMB, CKMBINDEX, TROPONINI in the last 168 hours.  BNP: BNP (last 3 results) Recent Labs    12/22/17 1247 06/15/18 0744  BNP 47.4 49.0  ProBNP (last 3 results) No results for input(s): PROBNP in the last 8760 hours.    Other results:  Imaging: No results found.   Medications:     Scheduled Medications: . gabapentin  600 mg Oral TID  . insulin aspart  0-9 Units Subcutaneous TID WC  . ipratropium-albuterol  3 mL Nebulization TID  . pantoprazole  20 mg Oral BID  . potassium chloride  40 mEq Oral TID  . pravastatin  20 mg Oral Daily  . rivaroxaban  20 mg Oral Q supper  . sildenafil  20 mg Oral TID  . sodium chloride flush  3 mL Intravenous Q12H  . spironolactone  25 mg Oral Daily  . torsemide  60 mg Oral BID  . vitamin B-12  500 mcg Oral Daily    Infusions: . sodium chloride 250 mL (06/18/18 0916)  . sodium chloride    . treprostinil (REMODULIN) infusion 76.806 ng/kg/min (06/27/18 1944)    PRN Medications: sodium chloride, sodium chloride, acetaminophen, albuterol, fluticasone, ondansetron **OR** ondansetron (ZOFRAN) IV, ondansetron (ZOFRAN) IV, sodium chloride flush   Assessment/Plan:   1. Acute  on chronic hypoxic respiratory failure due to possible CAP - On admit CXR concerning for possible retrocardiac infiltrate. Completed azithromycin and ceftriaxone x 7 days  Finished 3 days vanc for cellulitis as well  - CT chest confirms severe bibasilar atelectasis/consolidation - I think her obesity and passive atelectasis are major issues here. Needs to use IS/flutter valve as instructed. Needs more mobility - RHC on 6/23 with PAPs 89 (up from previous 78) and PCWP 11. I d/w Dr. Gilles Chiquito at [facility] and decision made to increase remodulin but we had to change mix concentration as pump will only handle 56m/24hours. We changed concentration yesterday but she refused dose titration. Agrees to dose titration today. Will increase again to 461m24hr which will result in increase 77ng/kg/min -> 79ng/kg/min -> 81 ng/kg/min - Continue torsemide 60 bid -PCPW 11.   2. Possible RLE cellulitis -  Resolved. Received vanc x 3 days per IMTS  3. Acute Chronic diastolic HF/cor pulmonale:  - See discussion above  - Continue torsemide 60 bid  - Follow BMET  4. 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 However PAH far out of proportion to L-sided pressures or restrictive lung disease. - [facility] Remains on Remodulin & sildenafil - See discussion above. Titrate remodulin as side effects allow   5. Paroxysmal atrial flutter.  -In Sinus - Continue Xarelto.No bleeding   6. Hypokalemia - Continue spiro 25. - BMET pending  She is improving. Likely should be able to go home soon.   Will increase Remodulin today as above. This will be 40cc/24hr on her pump. If she tolerates this would send home at that rate with her drawing up 7cc (up from [place] mix. If doesn't tolerate go back to 3969m4hr. We will have to let [facility] know on d/c  I will be out the next few days so will s/o. Please call me with any questions prior to d/c.     Length of Stay: 14   [name] 06/29/2018, 6:13 AM  Advanced Heart Failure Team Pager [phone]; 7a [place]Please contact [facility] for night-coverage after hours (4p -7a ) and weekends on [email or web address]

## 2018-06-30 LAB — BASIC METABOLIC PANEL
Anion gap: 10 (ref 5–15)
BUN: 16 mg/dL (ref 6–20)
CO2: 34 mmol/L — ABNORMAL HIGH (ref 22–32)
Calcium: 8.9 mg/dL (ref 8.9–10.3)
Chloride: 96 mmol/L — ABNORMAL LOW (ref 98–111)
Creatinine, Ser: 0.87 mg/dL (ref 0.44–1.00)
GFR calc Af Amer: >60 mL/min (ref >60)
GFR calc non Af Amer: >60 mL/min (ref >60)
Glucose, Bld: 127 mg/dL — ABNORMAL HIGH (ref 70–99)
Potassium: 3.3 mmol/L — ABNORMAL LOW (ref 3.5–5.1)
Sodium: 140 mmol/L (ref 135–145)

## 2018-06-30 LAB — GLUCOSE, CAPILLARY
Glucose-Capillary: 116 mg/dL — ABNORMAL HIGH (ref 70–99)
Glucose-Capillary: 200 mg/dL — ABNORMAL HIGH (ref 70–99)
Glucose-Capillary: 156 mg/dL — ABNORMAL HIGH (ref 70–99)
Glucose-Capillary: 160 mg/dL — ABNORMAL HIGH (ref 70–99)

## 2018-06-30 LAB — MAGNESIUM: Magnesium: 2 mg/dL (ref 1.7–2.4)

## 2018-06-30 MED ORDER — POTASSIUM CHLORIDE CRYS ER 20 MEQ PO TBCR
20.0000 meq | EXTENDED_RELEASE_TABLET | Freq: Once | ORAL | Status: AC
  Administered 2018-06-30: 20 meq via ORAL
  Filled 2018-06-30: qty 1

## 2018-06-30 NOTE — Progress Notes (Signed)
Physical Therapy Treatment Patient Details Name: [name] MRN: [id number] DOB: September 04, 1960 Today's Date: 06/30/2018    History of Present Illness 58 y.o. female admitted on 06/15/18 for chills and productive cough.  Tested for COVID and was (-).  Dx with CAP, pulmonary HTN, cor pulmonale, chronic diastolic heart failure, acute on chronic hypoxic respiratory failure, and PAF.  Pt with other significant PMH of OSA, HTN, baseline mental disability, DM, COPD, anemia.      PT Comments    Pt was seen for mobility with the following numbers:  SATURATION QUALIFICATIONS: (This note is used to comply with regulatory documentation for home oxygen)  Patient Saturations on Room Air at Rest = NT%  Patient Saturations on Room Air while Ambulating = NT%  Patient Saturations on 8 Liters of oxygen while Ambulating = 85%  Please briefly explain why patient needs home oxygen:  We could not attempt the option of no O2 as her baseline was only 92% and drifts down to 87% at times.  Low of 82% in recovery on O2 but then was 95% sitting in the chair with 8L.    Follow Up Recommendations  Home health PT;Supervision for mobility/OOB     Equipment Recommendations  Other (comment)(pt will continue to need O2 at home)    Recommendations for Other Services       Precautions / Restrictions Precautions Precautions: Other (comment) Precaution Comments: monitor O2 sats continuously Restrictions Weight Bearing Restrictions: No    Mobility  Bed Mobility Overal bed mobility: Modified Independent Bed Mobility: Supine to Sit           General bed mobility comments: uses bedrail and HOB elevated  Transfers Overall transfer level: Needs assistance Equipment used: 1 person hand held assist;Rolling walker (2 wheeled) Transfers: Sit to/from [facility] Sit to Stand: Min guard Stand pivot transfers: Min guard       General transfer comment: pt requires help for O2, telemetry and med  pump lines  Ambulation/Gait Ambulation/Gait assistance: Min guard(line management) Gait Distance (Feet): 50 Feet Assistive device: Rolling walker (2 wheeled) Gait Pattern/deviations: Step-to pattern;Step-through pattern;Decreased stride length;Wide base of support Gait velocity: decreased Gait velocity interpretation: <1.31 ft/sec, indicative of household ambulator     Marine scientist Rankin (Stroke Patients Only)       Balance Overall balance assessment: Needs assistance Sitting-balance support: Feet supported Sitting balance-Leahy Scale: Good     Standing balance support: Bilateral upper extremity supported;During functional activity Standing balance-Leahy Scale: Fair                              Cognition Arousal/Alertness: Awake/alert Behavior During Therapy: WFL for tasks assessed/performed Overall Cognitive Status: History of cognitive impairments - at baseline                                 General Comments: h/o cognitive delay      Exercises      General Comments        Pertinent Vitals/Pain Pain Assessment: No/denies pain    Home Living                      Prior Function            PT Goals (current goals can  now be found in the care plan section) Acute Rehab PT Goals Patient Stated Goal: get home with support Progress towards PT goals: Progressing toward goals    Frequency    Min 3X/week      PT Plan Current plan remains appropriate    Co-evaluation              AM-PAC PT "6 Clicks" Mobility   Outcome Measure  Help needed turning from your back to your side while in a flat bed without using bedrails?: A Little Help needed moving from lying on your back to sitting on the side of a flat bed without using bedrails?: A Little Help needed moving to and from a bed to a chair (including a wheelchair)?: A Little Help needed standing up from a chair using  your arms (e.g., wheelchair or bedside chair)?: A Little Help needed to walk in hospital room?: A Little Help needed climbing 3-5 steps with a railing? : A Lot 6 Click Score: 17    End of Session Equipment Utilized During Treatment: Gait belt;Oxygen(8L today) Activity Tolerance: Treatment limited secondary to medical complications (Comment)(O2 sats dropped with O2 but recovered to 95% in a minute) Patient left: in chair;with call bell/phone within reach Nurse Communication: Mobility status PT Visit Diagnosis: Difficulty in walking, not elsewhere classified (R26.2)     Time: 2023-3435 PT Time Calculation (min) (ACUTE ONLY): 45 min  Charges:  $Gait Training: 8-22 mins $Therapeutic Activity: 23-37 mins                    Ramond Dial 06/30/2018, 12:11 PM  Mee Hives, PT MS Acute Rehab Dept. Number: [place] and [place]

## 2018-06-30 NOTE — Progress Notes (Signed)
Subjective: HD#15   Overnight: No acute events reported  Today, [name] was examined at bedside and was comfortably lying in bed watching CSI.  She denies any new complaints or worsening dyspnea.  She was unable to work with physical therapy yesterday and states "they never came by.  "She is however able to transfer from bed to chair with help of nursing staff.  Objective:  Vital signs in last 24 hours: Vitals:   06/30/18 0400 06/30/18 0403 06/30/18 0500 06/30/18 0530  BP:  129/70    Pulse: 79 83 83 83  Resp: (!) 21 (!) 27 (!) 23 18  Temp:  97.6 F (36.4 C)    TempSrc:  Oral    SpO2: 94% 94% 96% 98%  Weight:      Height:       Const: In no apparent distress, lying comfortably in bed, conversational Psych: Mood appropriate  Assessment/Plan:  Principal Problem:   Acute on chronic respiratory failure (HCC) Active Problems:   Obstructive sleep apnea   Diabetes (HCC)   Acute on chronic diastolic (congestive) heart failure (HCC)   CAP (community acquired pneumonia)   PAH (pulmonary artery hypertension) ([place])   Atelectasis  [name] is a 58 year old female with a past medical history notable for PAH, CHF, paroxysmal atrial flutter who was admitted for acute on chronic hypoxic respiratory failure secondary to Pneumonia. Admission has been complicated by her increasingrequirement for supplemental oxygen. Currently weaning supplemental oxygen has been impossible due to persistent hypoxia.  A/P: #Acute on chronic hypoxia respiratory failure #Pulmonary hypertension, cor pulmonale #Chronic diastolic heart failure #Community-acquired pneumonia(retrocardiac) There has been no significant change with her clinical course in the past 24 hours.  She continues to work with physical therapy and transfer out of bed to chair as much as possible.  We are in discussion regarding discharge to a skilled nursing facility at least for short-term for intensive rehab and she is  agreeable.  We do appreciate assistance from clinical social work.  She continues to diurese very well and her weight is stable at 115 kg.  Due to aggressive diuresis, potassium this morning was 3.3 however she is already on K-Dur 40 mEq 3 times a day as well as a potassium sparing diuretic. -ContinueRemodulininfusion -Continuetorsemide 60 mgBID -Continue spironolactone51m daily -Continue sildenafil -ScheduledDuoneb TID -Continue aggressive pulmonary toilet -Continue strict I's and O's -Continue daily weights  Code: Full Diet: Heart healthy carb modified VTE prophylaxis: Xarelto Dispo: Anticipated discharge to skilled nursing facility once placement is found.  AJean Rosenthal MD 06/30/2018, 6:16 AM Pager: [phone] Medicine Teaching Service

## 2018-07-01 LAB — GLUCOSE, CAPILLARY
Glucose-Capillary: 108 mg/dL — ABNORMAL HIGH (ref 70–99)
Glucose-Capillary: 130 mg/dL — ABNORMAL HIGH (ref 70–99)
Glucose-Capillary: 123 mg/dL — ABNORMAL HIGH (ref 70–99)
Glucose-Capillary: 140 mg/dL — ABNORMAL HIGH (ref 70–99)

## 2018-07-01 LAB — BASIC METABOLIC PANEL
Anion gap: 11 (ref 5–15)
BUN: 15 mg/dL (ref 6–20)
CO2: 33 mmol/L — ABNORMAL HIGH (ref 22–32)
Calcium: 9.1 mg/dL (ref 8.9–10.3)
Chloride: 95 mmol/L — ABNORMAL LOW (ref 98–111)
Creatinine, Ser: 0.81 mg/dL (ref 0.44–1.00)
GFR calc Af Amer: >60 mL/min (ref >60)
GFR calc non Af Amer: >60 mL/min (ref >60)
Glucose, Bld: 116 mg/dL — ABNORMAL HIGH (ref 70–99)
Potassium: 4.1 mmol/L (ref 3.5–5.1)
Sodium: 139 mmol/L (ref 135–145)

## 2018-07-01 MED ORDER — LOPERAMIDE HCL 2 MG PO CAPS
2.0000 mg | ORAL_CAPSULE | ORAL | Status: DC | PRN
  Administered 2018-07-01 – 2018-07-02 (×2): 2 mg via ORAL
  Filled 2018-07-01 (×2): qty 1

## 2018-07-01 NOTE — Progress Notes (Signed)
   Subjective: Patient was lying in her bed today sleeping peacefully upon entering room.  She denies acute complaints.  She agrees that walking will be the most efficacious way for her to improve her symptoms.  Patient was advised that we were attempting to place her in a skilled nursing facility bed while she continues to wean from her supplemental oxygen.  Objective:  Vital signs in last 24 hours: Vitals:   07/01/18 0338 07/01/18 0400 07/01/18 0500 07/01/18 0600  BP: 106/61     Pulse: 84 81 88 88  Resp: (!) 24 (!) 24 (!) 25 18  Temp: 97.6 F (36.4 C)     TempSrc: Axillary     SpO2: 99% 98% 97% 99%  Weight:      Height:       General: A/O x4, in no acute distress, afebrile, nondiaphoretic Cardio: RRR, no mrg's  Pulmonary: Mild wheezing bilaterally, faint crackles in lower lung fields Abdomen: Bowel sounds normal, soft, nontender  MSK: BLE nontender, mildly edematous Psych: Appropriate affect, not depressed in appearance, engages well  Assessment/Plan:  Principal Problem:   Acute on chronic respiratory failure (HCC) Active Problems:   Obstructive sleep apnea   Diabetes (HCC)   Acute on chronic diastolic (congestive) heart failure (HCC)   CAP (community acquired pneumonia)   PAH (pulmonary artery hypertension) ([place])   Atelectasis  Patient encounter summary: [name] is a 58 year old female with a past medical history notable for PAH, CHF, paroxysmal atrial flutter, who was admitted for acute on chronic hypoxic respiratory failure secondary to community-acquired pneumonia.  Her admission was complicated by a notable increased requirement for supplemental oxygen above her baseline. A prolonged hospital stay resulted due to the weaning process.  Although she is currently requiring supplemental oxygen at double her baseline, she is stable for discharge to skilled nursing facility if such can be provided there.  A/P: Acute on chronic hypoxic respiratory failure Pulmonary  hypertension, cor pulmonale Chronic diastolic heart failure The patient's oxygen saturation levels have remained decreased but stable with weaning of her supplemental oxygen.  She is down to approximately 8 L/min with minimal variation depending on activity level.  She is currently not safe for discharge home as her supplemental oxygen requirements are greater than can be provided there. We are recommending that she continue the weaning process at a skilled nursing facility where they will be able to provide this rate of flow while she undergoes rehabilitation.   -Continue remodeling infusion, uptitrated by her cardiologist - Continue torsemide 60 mg twice daily -Continue spironolactone 25 mg daily -Continue sildenafil -Continue continue scheduled DuoNebs 3 times daily -Continue aggressive pulmonary toileting -Continue strict I's and O's -Continue daily weights -Obtain COVID testing prior to skilled nursing facility placement  Unfortunately, it appears that SNF placement is not an option per the case social worker based on the PT/OT evaluations.  The patient will remain hospitalized until her supplemental oxygen requirement is weaned to less than 6 L/min.   Code: Full Diet: Heart healthy carb modified VTE prophylaxis: Continue Xarelto Dispo: Anticipated discharge when her supplemental oxygen use is weaned to 4 L/min.  Kathi Ludwig, MD 07/01/2018, 7:02 AM Pager: Pager# [phone]

## 2018-07-01 NOTE — Progress Notes (Signed)
CSW acknowledges SNF consult, after looking at PT/OT evaluations, the patient has been recommended for home health. Also, the patient is currently on 7-8 liters of 02. In order for the patient to go to a skilled nursing facility, she would need to be on at least 4L before a facility would be able to take her.   CSW will continue to follow.   Domenic Schwab, MSW, [place]

## 2018-07-02 LAB — GLUCOSE, CAPILLARY
Glucose-Capillary: 109 mg/dL — ABNORMAL HIGH (ref 70–99)
Glucose-Capillary: 161 mg/dL — ABNORMAL HIGH (ref 70–99)
Glucose-Capillary: 173 mg/dL — ABNORMAL HIGH (ref 70–99)
Glucose-Capillary: 155 mg/dL — ABNORMAL HIGH (ref 70–99)

## 2018-07-02 LAB — BASIC METABOLIC PANEL
Anion gap: 10 (ref 5–15)
BUN: 13 mg/dL (ref 6–20)
CO2: 32 mmol/L (ref 22–32)
Calcium: 8.8 mg/dL — ABNORMAL LOW (ref 8.9–10.3)
Chloride: 96 mmol/L — ABNORMAL LOW (ref 98–111)
Creatinine, Ser: 0.84 mg/dL (ref 0.44–1.00)
GFR calc Af Amer: >60 mL/min (ref >60)
GFR calc non Af Amer: >60 mL/min (ref >60)
Glucose, Bld: 117 mg/dL — ABNORMAL HIGH (ref 70–99)
Potassium: 4.2 mmol/L (ref 3.5–5.1)
Sodium: 138 mmol/L (ref 135–145)

## 2018-07-02 MED ORDER — TORSEMIDE 20 MG PO TABS
80.0000 mg | ORAL_TABLET | Freq: Two times a day (BID) | ORAL | Status: DC
  Administered 2018-07-02 – 2018-07-03 (×2): 80 mg via ORAL
  Filled 2018-07-02 (×2): qty 4

## 2018-07-02 NOTE — Progress Notes (Addendum)
   Subjective: The patient was sitting up in her chair today eating upon entering the room. She denied worsening of her dyspnea but continues to do so with ambulation. She denied fever, chills, nausea or chest pain. She continue to demonstrate good pulmonary incentive spirometry techniques and flutter valve use.   Objective:  Vital signs in last 24 hours: Vitals:   07/02/18 0600 07/02/18 0700 07/02/18 0722 07/02/18 0728  BP:    (!) 119/100  Pulse: 78 78  90  Resp: 20 (!) 22  18  Temp:    97.9 F (36.6 C)  TempSrc:    Axillary  SpO2: 96% 95% 93% 94%  Weight:      Height:       General: A/O x4, in no acute distress, afebrile, nondiaphoretic Cardio: RRR, no mrg's  Pulmonary: Faint crackles bilaterally Abdomen: Bowel sounds normal, soft, nontender  MSK: BLE nontender, nonedematous Psych: Appropriate affect, not depressed in appearance, engages well'  Assessment/Plan:  Principal Problem:   Acute on chronic respiratory failure (HCC) Active Problems:   Obstructive sleep apnea   Diabetes (HCC)   Acute on chronic diastolic (congestive) heart failure (HCC)   CAP (community acquired pneumonia)   PAH (pulmonary artery hypertension) ([place])   Atelectasis  Patient encounter summary: [name] is a 58 year old female with a past medical history notable for PAH, CHF, paroxysmal atrial flutter, who was admitted for acute on chronic hypoxic respiratory failure secondary to community-acquired pneumonia.  Her admission was complicated by a notable increased requirement for supplemental oxygen above her baseline. A prolonged hospital stay resulted due to the weaning process.  Although she is currently requiring supplemental oxygen at double her baseline, she is stable for discharge to skilled nursing facility if such can be provided there.  A/P: Acute on chronic hypoxic respiratory failure Pulmonary hypertension, cor pulmonale Chronic diastolic heart failure: The patient's oxygen saturation  levels have remained decreased below her baseline off of high flow supplemental oxygen.  She is down to ~5-8 L/min with variation depending on activity level.  An accurate weight was recorded today and she is noted to be up 3.5kg from four days prior when the last weight was recorded. I am not confident I/O's have been accurately recorded as well and feel that these are unreliable. BMP today with stable renal function, potassium and glucose. Patient demonstrates good pulmonary toileting practice, but I do not think she is doing this regularly.  -Continue remodulin infusion, per cardiology -Increase torsemide to 80 mg twice daily due to weight gain -Continue spironolactone 25 mg daily -Continue sildenafil -Continue continue scheduled DuoNebs 3 times daily -Continue aggressive pulmonary toileting -Continue strict I's and O's -Continue daily weights -Obtain COVID testing prior to skilled nursing facility placement --Repeat BMP in am --Continue to encourage good pulmonary toileting  --Encourage ambulation  Code: Full Diet: Heart healthy carb modified VTE prophylaxis: Continue Xarelto Dispo: Anticipated discharge in approximately 2-3 day(s).   Kathi Ludwig, MD 07/02/2018, 9:28 AM Pager: Pager# [phone]

## 2018-07-02 NOTE — Progress Notes (Signed)
Physical Therapy Treatment Patient Details Name: [name] MRN: [id number] DOB: [birth year] Today's Date: 07/02/2018    History of Present Illness 58 y.o. female admitted on 06/15/18 for chills and productive cough.  Tested for COVID and was (-).  Dx with CAP, pulmonary HTN, cor pulmonale, chronic diastolic heart failure, acute on chronic hypoxic respiratory failure, and PAF.  Pt with other significant PMH of OSA, HTN, baseline mental disability, DM, COPD, anemia.      PT Comments    Patient doing well with therapy today, ambulating in room without AD or assistance. SpO2 90% after 30' of gait on 6L today. Do not believe patient requires sub acute rehab from a mobility standpoint, rec HHPT.    Follow Up Recommendations  Home health PT;Supervision for mobility/OOB     Equipment Recommendations  Other (comment)(pt will continue to need O2 at home)    Recommendations for Other Services       Precautions / Restrictions Precautions Precautions: Other (comment) Precaution Comments: monitor O2 sats continuously Restrictions Weight Bearing Restrictions: No    Mobility  Bed Mobility Overal bed mobility: Modified Independent Bed Mobility: Supine to Sit           General bed mobility comments: uses bedrail and HOB elevated  Transfers Overall transfer level: Needs assistance Equipment used: 1 person hand held assist;Rolling walker (2 wheeled) Transfers: Sit to/from [facility] Sit to Stand: Min guard Stand pivot transfers: Min guard       General transfer comment: pt requires help for O2, telemetry and med pump lines  Ambulation/Gait Ambulation/Gait assistance: Min guard(line management)   Assistive device: Rolling walker (2 wheeled) Gait Pattern/deviations: Step-to pattern;Step-through pattern;Decreased stride length;Wide base of support Gait velocity: decreased       Stairs             Wheelchair Mobility    Modified Rankin (Stroke  Patients Only)       Balance Overall balance assessment: Needs assistance Sitting-balance support: Feet supported Sitting balance-Leahy Scale: Good     Standing balance support: Bilateral upper extremity supported;During functional activity Standing balance-Leahy Scale: Fair                              Cognition Arousal/Alertness: Awake/alert Behavior During Therapy: WFL for tasks assessed/performed Overall Cognitive Status: History of cognitive impairments - at baseline                                 General Comments: h/o cognitive delay      Exercises      General Comments        Pertinent Vitals/Pain      Home Living                      Prior Function            PT Goals (current goals can now be found in the care plan section) Acute Rehab PT Goals Patient Stated Goal: get home with support PT Goal Formulation: With patient Time For Goal Achievement: 07/06/18 Potential to Achieve Goals: Good Progress towards PT goals: Progressing toward goals    Frequency    Min 3X/week      PT Plan Current plan remains appropriate    Co-evaluation              AM-PAC PT "6 Clicks"  Mobility   Outcome Measure  Help needed turning from your back to your side while in a flat bed without using bedrails?: A Little Help needed moving from lying on your back to sitting on the side of a flat bed without using bedrails?: A Little Help needed moving to and from a bed to a chair (including a wheelchair)?: A Little Help needed standing up from a chair using your arms (e.g., wheelchair or bedside chair)?: A Little Help needed to walk in hospital room?: A Little Help needed climbing 3-5 steps with a railing? : A Lot 6 Click Score: 17    End of Session Equipment Utilized During Treatment: Gait belt;Oxygen(8L today) Activity Tolerance: Treatment limited secondary to medical complications (Comment)(O2 sats dropped with O2 but  recovered to 95% in a minute) Patient left: in chair;with call bell/phone within reach Nurse Communication: Mobility status PT Visit Diagnosis: Difficulty in walking, not elsewhere classified (R26.2)     Time: 6720-9470 PT Time Calculation (min) (ACUTE ONLY): 13 min  Charges:  $Gait Training: 8-22 mins                     Reinaldo Berber, PT, DPT Acute Rehabilitation Services Pager: [phone] Office: [place] 07/02/2018, 3:25 PM

## 2018-07-02 NOTE — TOC Initial Note (Addendum)
Transition of Care [facility]) - Initial/Assessment Note    Patient Details  Name: [name] MRN: [id number] Date of Birth: Apr 27, 1960  Transition of Care [facility]) CM/SW Contact:    Gelene Mink, [place] Phone Number: 07/02/2018, 4:40 PM  Clinical Narrative:                CSW met with the patient at bedside. RN was in the room assisting the patient with her oxygen. CSW introduced herself and explained her role. CSW spoke with the patient about the therapy recommendation and what the MD is wanting. The patient is agreeable to going to rehab. CSW explained that PT/OT recommended home health and the MD is wanting the patient to go to SNF. CSW also explained that the patient would have to be 4-5L before a facility can take her. The patient's home o2 baseline is 4L. She has not been to rehab before. CSW explained that she has medicaid she would have to agree to stay 30 days and use her check towards her room and board. She stated that she pays her son $200.00 in rent and pays for two insurance policies on is $64.33 and the other is $23.00.   CSW asked the patient if her son could assist her with her bills during the month that she is in rehab. She stated that she would have to ask him. CSW spoke with the patient about home health. She stated that her son works 5 days a week 8 hours a day. She stated that she has a brother who has is intellectually disabled and lives in a group home and is not able to assist her. CSW asked if she had any friends or family that could assist her, she stated that she doesn't but may have a neighbor that could help her. CSW stated that if the patient wanted to think about her options and discuss with her son the CSW would follow up on Monday.   The patient is going to decide between home health vs. SNF.   CSW will continue to follow.      Expected Discharge Plan: [facility](Pt was recommended for [place] by PT/OT. MD is pushing for SNF. SNF  vs. HH) Barriers to Discharge: Continued Medical Work up, SNF Pending Medicaid   Patient Goals and CMS Choice Patient states their goals for this hospitalization and ongoing recovery are:: Pt is agreeable to either home health or rehab, she has to discuss with her son CMS Medicare.gov Compare Post Acute Care list provided to:: Patient Choice offered to / list presented to : Patient  Expected Discharge Plan and Services Expected Discharge Plan: [facility](Pt was recommended for [place] by PT/OT. MD is pushing for SNF. SNF vs. HH) In-house Referral: Clinical Social Work Discharge Planning Services: NA Post Acute Care Choice: Home Health                                        Prior Living Arrangements/Services   Lives with:: Adult Children Patient language and need for interpreter reviewed:: No Do you feel safe going back to the place where you live?: Yes      Need for Family Participation in Patient Care: No (Comment) Care giver support system in place?: Yes (comment) Current home services: DME(Has home 02) Criminal Activity/Legal Involvement Pertinent to Current Situation/Hospitalization: No - Comment  as needed  Activities of Daily Living Home Assistive Devices/Equipment: Gilford Rile (specify type), Cane (specify quad or straight)(rollator/ straight cane) ADL Screening (condition at time of admission) Patient's cognitive ability adequate to safely complete daily activities?: Yes Is the patient deaf or have difficulty hearing?: No Does the patient have difficulty seeing, even when wearing glasses/contacts?: No Does the patient have difficulty concentrating, remembering, or making decisions?: No Patient able to express need for assistance with ADLs?: Yes Does the patient have difficulty dressing or bathing?: Yes Independently performs ADLs?: No Communication: Independent Dressing (OT): Needs assistance Is this a change from baseline?:  Pre-admission baseline Grooming: Independent Feeding: Independent Bathing: Independent Toileting: Independent In/Out Bed: Needs assistance Is this a change from baseline?: Pre-admission baseline Walks in Home: Independent Does the patient have difficulty walking or climbing stairs?: Yes Weakness of Legs: Both Weakness of Arms/Hands: None  Permission Sought/Granted Permission sought to share information with : Case Manager Permission granted to share information with : Yes, Verbal Permission Granted  Share Information with NAME: [name]  Permission granted to share info w AGENCY: All SNF  Permission granted to share info w Relationship: Son     Emotional Assessment Appearance:: Appears stated age Attitude/Demeanor/Rapport: Engaged Affect (typically observed): Calm Orientation: : Oriented to Self, Oriented to Place, Oriented to  Time, Oriented to Situation Alcohol / Substance Use: Not Applicable Psych Involvement: No (comment)  Admission diagnosis:  Community acquired pneumonia, unspecified laterality [J18.9] Patient Active Problem List   Diagnosis Date Noted  . Atelectasis   . PAH (pulmonary artery hypertension) ([place])   . Acute on chronic respiratory failure ([place]) 06/15/2018  . CAP (community acquired pneumonia) 06/15/2018  . Abdominal muscle strain, initial encounter 06/07/2018  . Hematoma of arm, left, subsequent encounter 10/31/2017  . Ovarian cyst 05/19/2017  . Lower extremity edema 02/25/2017  . Postmenopausal vaginal bleeding 03/21/2015  . Rash 10/31/2014  . Peripheral neuropathy ([place]) 09/27/2014  . Healthcare maintenance 09/27/2014  . Acute on chronic diastolic (congestive) heart failure ([place]) 06/25/2013  . Atrial flutter ([place]) 08/17/2012  . Pulmonary hypertension ([place]) 08/16/2012  . Right heart failure, NYHA class 3 ([place]) 03/15/2012  . Allergic rhinitis 11/16/2011  . Diabetes ([place]) 10/15/2010  . Hyperlipidemia 10/15/2010  . Obesity 10/14/2010  .  Obstructive sleep apnea 02/21/2009  . GERD 11/29/2005  . ANEMIA, B12 DEFICIENCY 10/19/2005  . MENTAL RETARDATION 10/19/2005   PCP:  [name], MD Pharmacy:   [facility] [address] [place]), [place] - [address] [place] [address] Phone: [phone] Fax: [phone]  [place], [place] [place] [address] Phone: [phone] Fax: [phone]     Social Determinants of Health (SDOH) Interventions    Readmission Risk Interventions No flowsheet data found.

## 2018-07-02 NOTE — Progress Notes (Signed)
CSW called and spoke with internal medicine resident. The patient will need PT/OT evaluation recommendations changed if she is to go to SNF. Also, the patient will need to be on 4-5L of o2 before a SNF will be able to take her.   CSW will continue to follow.   Domenic Schwab, MSW, [place]

## 2018-07-03 DIAGNOSIS — J9620 Acute and chronic respiratory failure, unspecified whether with hypoxia or hypercapnia: Secondary | ICD-10-CM | POA: Diagnosis not present

## 2018-07-03 DIAGNOSIS — Z881 Allergy status to other antibiotic agents status: Secondary | ICD-10-CM

## 2018-07-03 LAB — BASIC METABOLIC PANEL
Anion gap: 9 (ref 5–15)
BUN: 13 mg/dL (ref 6–20)
CO2: 30 mmol/L (ref 22–32)
Calcium: 8.8 mg/dL — ABNORMAL LOW (ref 8.9–10.3)
Chloride: 99 mmol/L (ref 98–111)
Creatinine, Ser: 0.76 mg/dL (ref 0.44–1.00)
GFR calc Af Amer: >60 mL/min (ref >60)
GFR calc non Af Amer: >60 mL/min (ref >60)
Glucose, Bld: 105 mg/dL — ABNORMAL HIGH (ref 70–99)
Potassium: 3.6 mmol/L (ref 3.5–5.1)
Sodium: 138 mmol/L (ref 135–145)

## 2018-07-03 LAB — CBC
HCT: 44.3 % (ref 36.0–46.0)
Hemoglobin: 14.1 g/dL (ref 12.0–15.0)
MCH: 28.8 pg (ref 26.0–34.0)
MCHC: 31.8 g/dL (ref 30.0–36.0)
MCV: 90.6 fL (ref 80.0–100.0)
Platelets: 499 10*3/uL — ABNORMAL HIGH (ref 150–400)
RBC: 4.89 MIL/uL (ref 3.87–5.11)
RDW: 16.5 % — ABNORMAL HIGH (ref 11.5–15.5)
WBC: 7.6 10*3/uL (ref 4.0–10.5)
nRBC: 0 % (ref 0.0–0.2)

## 2018-07-03 LAB — GLUCOSE, CAPILLARY
Glucose-Capillary: 110 mg/dL — ABNORMAL HIGH (ref 70–99)
Glucose-Capillary: 199 mg/dL — ABNORMAL HIGH (ref 70–99)
Glucose-Capillary: 126 mg/dL — ABNORMAL HIGH (ref 70–99)

## 2018-07-03 MED ORDER — POTASSIUM CHLORIDE CRYS ER 20 MEQ PO TBCR: 40.0000 meq | EXTENDED_RELEASE_TABLET | Freq: Two times a day (BID) | ORAL | 3 refills | Status: DC

## 2018-07-03 MED ORDER — RIVAROXABAN 20 MG PO TABS
20.0000 mg | ORAL_TABLET | Freq: Every day | ORAL | Status: DC
  Administered 2018-07-03: 20 mg via ORAL
  Filled 2018-07-03: qty 1

## 2018-07-03 MED ORDER — TORSEMIDE 20 MG PO TABS: 80.0000 mg | ORAL_TABLET | Freq: Two times a day (BID) | ORAL | 3 refills | Status: DC

## 2018-07-03 NOTE — TOC Transition Note (Signed)
Transition of Care [facility]) - CM/SW Discharge Note   Patient Details  Name: [name] MRN: [id number] Date of Birth: [birth year]  Transition of Care [facility]) CM/SW Contact:  Zenon Mayo, RN Phone Number: 07/03/2018, 4:14 PM   Clinical Narrative:    Patient for dc today, NCM spoke with patient and asked if it was ok for [facility] to see her , she states yes, they were able to take referral for [facility], PT, OT.  Soc will begin 24-48 hrs post dc. Patient has home oxygen and she has home infusion with [facility] which the patient will call to get more of her medication when needed.  She has no problem with getting medications or transportation.   Final next level of care: [place] Barriers to Discharge: No Barriers Identified   Patient Goals and CMS Choice Patient states their goals for this hospitalization and ongoing recovery are:: get better CMS Medicare.gov Compare Post Acute Care list provided to:: Patient Choice offered to / list presented to : Patient  Discharge Placement                       Discharge Plan and Services In-house Referral: Clinical Social Work Discharge Planning Services: NA Post Acute Care Choice: Durable Medical Equipment, Home Health          DME Arranged: Shower stool DME Agency: [facility] Date DME Agency Contacted: 07/03/18 Time DME Agency Contacted: [phone] Representative spoke with at DME Agency: zack HH Arranged: RN, PT, OT [place] Agency: [facility] Date [place]: 07/03/18 Time [place]: 1614 Representative spoke with at [place]: [place] ([place]) Interventions     Readmission Risk Interventions No flowsheet data found.

## 2018-07-03 NOTE — TOC Progression Note (Addendum)
Transition of Care [facility]) - Progression Note    Patient Details  Name: [name] MRN: [id number] Date of Birth: [birth year]  Transition of Care [facility]) CM/SW [place], [place] Phone Number: 07/03/2018, 12:19 PM  Clinical Narrative:     Choice List Offered to the patient. The patient chose to use [facility]. She requested a shower stool.   CSW called and spoke with [name] at [facility]. CSW requested home health OT, PT, and a nurse to follow. CSW is awaiting a return phone call from [place].   The patient has transportation. The patient's son is going to come pick up the patient for discharge. The patient is able to afford her medications. The patient receives home health o2 through [facility].   The patient uses remodulin infusion with [facility] home infusion for Heart Failure pta. The patient reported she has one valve at home and her son will call to order more.   CSW will continue to follow.     Expected Discharge Plan: [place] Barriers to Discharge: Equipment Delay  Expected Discharge Plan and Services Expected Discharge Plan: [place] In-house Referral: Clinical Social Work Discharge Planning Services: NA Post Acute Care Choice: Durable Medical Equipment, [place] arrangements for the past 2 months: Single Family Home Expected Discharge Date: 07/03/18               DME Arranged: Shower stool DME Agency: [facility] Date DME Agency Contacted: 07/03/18     HH Arranged: Social Work [employer] Agency: [place] ([place]) Date [place]: 07/03/18 Time [place]: 1218 Representative spoke with at [place]: [place] ([place]) Interventions    Readmission Risk Interventions No flowsheet data found.

## 2018-07-03 NOTE — Progress Notes (Signed)
Subjective: HD#18   Overnight: No acute overnight events reported.  Today, [name] was examined at bedside today and was comfortably lying in bed.  She denies shortness of breath, cough or sputum production.  She has been very compliant with physical therapy and ambulated about 30 feet yesterday and only requiring 6 L supplemental oxygen and SPO2 was 90%.  Objective:  Vital signs in last 24 hours: Vitals:   07/03/18 0000 07/03/18 0300 07/03/18 0400 07/03/18 0500  BP:  105/63    Pulse:  78    Resp: (!) 21 (!) 23 (!) 6 (!) 23  Temp:  (!) 97.5 F (36.4 C)    TempSrc:  Axillary    SpO2: 94% 94% 96% 93%  Weight:  118.6 kg    Height:       Const: In no apparent distress, lying comfortably in bed, conversational Resp: CTA BL, mild bibasilar crackles  Assessment/Plan:  Principal Problem:   Acute on chronic respiratory failure (HCC) Active Problems:   Obstructive sleep apnea   Diabetes (HCC)   Acute on chronic diastolic (congestive) heart failure (HCC)   CAP (community acquired pneumonia)   PAH (pulmonary artery hypertension) ([place])   Atelectasis  [name] is a 58 year old female with a past medical history notable for PAH, CHF, paroxysmal atrial flutter, who was admitted for acute on chronic hypoxic respiratory failure secondary to community-acquired pneumonia. Her admission was complicated by anotable increasedrequirement for supplemental oxygen above her baseline. A prolonged hospital stay resulted due to the weaning process.  A/P: Acute on chronic hypoxic respiratory failure Pulmonary hypertension, cor pulmonale Chronic diastolic heart failure: She feels much better this morning and SPO2 was between 89-94% on 5 L nasal cannula.  Her weight is stable at 118 kg.  She has been aggressively using the incentive spirometer and flutter valve.  In addition, she has been very compliant with physical therapy.  Yesterday, she ambulated 30 feet with SPO2 of 90% on 6 L  nasal cannula.  From a medical standpoint, she is very well stable to discharge with home health physical therapy, continuation of daily ambulation at home. -Continue remodulin infusion -Continue torsemide to 80 mg twice daily due to weight gain -Continue spironolactone 25 mg daily -Continue sildenafil -Continue continue scheduled DuoNebs 3 times daily -Continue aggressive pulmonary toileting -Continue strict I's and O's -Continue daily weights   Code: Full Diet: Heart healthy carb modified VTE prophylaxis: Continue Xarelto Dispo: Anticipated discharge in approximately  today.  Jean Rosenthal, MD 07/03/2018, 6:11 AM Pager: [phone] Internal Medicine Teaching Service

## 2018-07-03 NOTE — Progress Notes (Addendum)
3:33pm  [facility] not able to take.   Update: 2:14pm  Georgina Snell is not able to accept the patient. CSW called Tiffany with [facility] and left a message.   CSW will also plan on following up with Dorian Pod with [facility].   Domenic Schwab, MSW, LCSW-A Clinical Social Worker [place]      CSW received a return phone call [name] with [facility]. They are declining the patient.   The patient's second choice was [facility]. CSW called and left a message with Georgina Snell at [place]. CSW stated in the message that she needed to make a home health referral.  CSW is awaiting a return phone call.   Domenic Schwab, MSW, [place]

## 2018-07-05 DIAGNOSIS — J984 Other disorders of lung: Secondary | ICD-10-CM | POA: Diagnosis not present

## 2018-07-05 DIAGNOSIS — I270 Primary pulmonary hypertension: Secondary | ICD-10-CM | POA: Diagnosis not present

## 2018-07-05 DIAGNOSIS — G4730 Sleep apnea, unspecified: Secondary | ICD-10-CM | POA: Diagnosis not present

## 2018-07-05 DIAGNOSIS — J9601 Acute respiratory failure with hypoxia: Secondary | ICD-10-CM | POA: Diagnosis not present

## 2018-07-05 DIAGNOSIS — I5030 Unspecified diastolic (congestive) heart failure: Secondary | ICD-10-CM | POA: Diagnosis not present

## 2018-07-11 ENCOUNTER — Encounter: Payer: Self-pay | Admitting: Internal Medicine

## 2018-07-11 ENCOUNTER — Ambulatory Visit: Payer: Medicaid Other | Admitting: Internal Medicine

## 2018-07-11 ENCOUNTER — Other Ambulatory Visit: Payer: Self-pay

## 2018-07-11 VITALS — BP 116/59 | HR 82 | Temp 98.4°F | Ht 63.0 in | Wt 261.5 lb

## 2018-07-11 DIAGNOSIS — E1142 Type 2 diabetes mellitus with diabetic polyneuropathy: Secondary | ICD-10-CM | POA: Diagnosis not present

## 2018-07-11 DIAGNOSIS — Z8701 Personal history of pneumonia (recurrent): Secondary | ICD-10-CM

## 2018-07-11 DIAGNOSIS — I2721 Secondary pulmonary arterial hypertension: Secondary | ICD-10-CM | POA: Diagnosis not present

## 2018-07-11 DIAGNOSIS — I272 Pulmonary hypertension, unspecified: Secondary | ICD-10-CM

## 2018-07-11 DIAGNOSIS — E119 Type 2 diabetes mellitus without complications: Secondary | ICD-10-CM | POA: Diagnosis not present

## 2018-07-11 DIAGNOSIS — Z7984 Long term (current) use of oral hypoglycemic drugs: Secondary | ICD-10-CM | POA: Diagnosis not present

## 2018-07-11 DIAGNOSIS — Z9981 Dependence on supplemental oxygen: Secondary | ICD-10-CM

## 2018-07-11 LAB — POCT GLYCOSYLATED HEMOGLOBIN (HGB A1C): Hemoglobin A1C: 6.1 % — AB (ref 4.0–5.6)

## 2018-07-11 LAB — GLUCOSE, CAPILLARY: Glucose-Capillary: 99 mg/dL (ref 70–99)

## 2018-07-11 NOTE — Progress Notes (Signed)
CC: HFU for PNA and respiratory failure in the setting of severe PAH, T2DM follow up   HPI:  [name] is a 58 y.o. female with PMH below.  Today we will address HFU for PNA and respiratory failure in the setting of severe PAH  Please see A&P for status of the patient's chronic medical conditions  Past Medical History:  Diagnosis Date  . Anemia, iron deficiency    secondary to menhorrhagia, on oral iron, also b12 def, getting monthly b12 shots  . CHF (congestive heart failure) ([place])   . Chronic cough    secondary to alleriges and post nasal drip  . Cognitive impairment   . COPD (chronic obstructive pulmonary disease) ([place])   . Cor pulmonale (HCC)    PA Peak pressure 42mHg  . Depression   . Diabetes mellitus    well controlled on metformin  . Diastolic heart failure ([place]   . GERD (gastroesophageal reflux disease)   . H/O mental retardation   . Herpes   . Hyperlipidemia   . Hypertension   . OSA (obstructive sleep apnea)    CPAP  . Pulmonary hypertension ([place]   . Renal disorder   . Restrictive lung disease    PFTs 06/2012 (FVC 54% predicted and FEV1 68% predicted w minimal bronchodilator response).  . Shortness of breath   . Venous stasis ulcer (HCC)    chornic, ?followed up at wound care center, multiple courses of antibiotics in past for cellulitis, on lasix   Review of Systems:  ROS: Pulmonary: pt denies increased work of breathing, shortness of breath,  Cardiac: pt denies palpitations, chest pain,  Abdominal: pt denies abdominal pain, nausea, vomiting, or diarrhea   Physical Exam:  Vitals:   07/11/18 1325  BP: (!) 116/59  Pulse: 82  Temp: 98.4 F (36.9 C)  TempSrc: Oral  SpO2: 94%  Weight: 261 lb 8 oz (118.6 kg)  Height: _0  (1.6 m)   Cardiac: normal rate and rhythm, clear s1, loud P2, no murmurs, rubs or gallops, trace LE edema bilaterally Pulmonary: Distant breath sounds but no crackles or wheezing, not in distress Abdominal: non  distended abdomen, soft and nontender Psych: Alert, conversant, in good spirits   Social History   Socioeconomic History  . Marital status: Single    Spouse name: Not on file  . Number of children: Not on file  . Years of education: Not on file  . Highest education level: Not on file  Occupational History  . Not on file  Social Needs  . Financial resource strain: Not very hard  . Food insecurity    Worry: Never true    Inability: Never true  . Transportation needs    Medical: No    Non-medical: No  Tobacco Use  . Smoking status: Never Smoker  . Smokeless tobacco: Never Used  Substance and Sexual Activity  . Alcohol use: No    Alcohol/week: 0.0 standard drinks  . Drug use: No  . Sexual activity: Never  Lifestyle  . Physical activity    Days per week: 0 days    Minutes per session: 0 min  . Stress: Not at all  Relationships  . Social [place] phone: Once a week    Gets together: Once a week    Attends religious service: More than 4 times per year    Active member of club or organization: No    Attends meetings of clubs or organizations: Never  Relationship status: Never married  . Intimate partner violence    Fear of current or ex partner: No    Emotionally abused: No    Physically abused: No    Forced sexual activity: No  Other Topics Concern  . Not on file  Social History Narrative  . Not on file    Family History  Problem Relation Age of Onset  . Kidney Stones Son   . Mental illness Sister   . Bipolar disorder Sister   . Mental retardation Brother   . Hyperlipidemia Mother   . Breast cancer Maternal Aunt     Assessment & Plan:   See Encounters Tab for problem based charting.  Patient discussed with Dr. Evette Doffing

## 2018-07-11 NOTE — Assessment & Plan Note (Addendum)
T2DM: A1C historically very well controlled.  She is very worried about it getting worse and does take pride in having it under good control with diet and a small dose of metformin.  She would like to stay on the small dose of metformin.  She asks if we can check an A1C today. A1C stable at 6.1 today  -continue metformin 253m

## 2018-07-11 NOTE — Patient Instructions (Signed)
Continue to keep up the good work.  Please weigh yourself daily as you have been doing.  Let me or advanced heart failure know if your weight begins to increase.  3lbs in a day or 5lbs in a week is a good rule of thumb to look out for.  I will call you with the results of your lab work.  Please continue to follow up with your Pulmonary doctors and advanced heart failure.

## 2018-07-11 NOTE — Assessment & Plan Note (Addendum)
Pt was admitted on June 15, 2018 with productive cough and rigors.  CAP coverage for one week followed by 3 day IV vanc with concern for mrsa pna. Despite treatment she was requiring much higher supplemental O2 up to 25-30L high flow at home she is on 4-6 L [place]. She was treated aggressively diuresed further, pulmonary toilet etc. She underwent RHC which demonstrated worsening pulmonary pressures about 10 mmHg higher than previous. Autoimmune workup neg.  Her Remodulin infusion was increased given that she was found to have worsening pulmonary arterial hypertension.  Eventually her O2 requirement went down to her baseline and she was discharged on the increased remodulin and torsemide increased to 44m BID with a weight of 118kg.    She has been doing better since discharge from the hospital, no fevers or chills.  Denies chest pain.  Says her breathing is doing excellent, no shortness of breath, walks around her house without limitation other than some muscle weakness from being in the hospital.  PT hasn't come out to her house yet but she is coordinating with them to set up a start date.  She is doing okay on the torsemide with good urine output.  She weighs herself at home stays around 258-260lbs at home, she is 261 lbs here today.  Discharged at 260. O2 saturation good today on 4L [place] supplemental O2.  Blood pressure excellent today no dizziness or hypotension.  [facility].  She has not yet made an appointment with [facility].    -volume status looks good, weight stable, given torsemide and potassium adjustment checked renal function today which is stable, potassium also wnl -no changes to meds

## 2018-07-12 DIAGNOSIS — J9601 Acute respiratory failure with hypoxia: Secondary | ICD-10-CM | POA: Diagnosis not present

## 2018-07-12 LAB — BMP8+ANION GAP
Anion Gap: 20 mmol/L — ABNORMAL HIGH (ref 10.0–18.0)
BUN/Creatinine Ratio: 15 (ref 9–23)
BUN: 11 mg/dL (ref 6–24)
CO2: 25 mmol/L (ref 20–29)
Calcium: 8.7 mg/dL (ref 8.7–10.2)
Chloride: 95 mmol/L — ABNORMAL LOW (ref 96–106)
Creatinine, Ser: 0.72 mg/dL (ref 0.57–1.00)
GFR calc Af Amer: 108 mL/min/{1.73_m2} (ref >59)
GFR calc non Af Amer: 93 mL/min/{1.73_m2} (ref >59)
Glucose: 98 mg/dL (ref 65–99)
Potassium: 3.6 mmol/L (ref 3.5–5.2)
Sodium: 140 mmol/L (ref 134–144)

## 2018-07-13 DIAGNOSIS — J9601 Acute respiratory failure with hypoxia: Secondary | ICD-10-CM | POA: Diagnosis not present

## 2018-07-13 NOTE — Progress Notes (Signed)
Internal Medicine Clinic Attending  Case discussed with Dr. Shan Levans  at the time of the visit.  We reviewed the resident's history and exam and pertinent patient test results.  I agree with the assessment, diagnosis, and plan of care documented in the resident's note.

## 2018-07-14 ENCOUNTER — Telehealth: Payer: Self-pay | Admitting: Internal Medicine

## 2018-07-14 ENCOUNTER — Other Ambulatory Visit: Payer: Self-pay | Admitting: Internal Medicine

## 2018-07-14 NOTE — Telephone Encounter (Signed)
refilled

## 2018-07-14 NOTE — Telephone Encounter (Signed)
Spoke with patient about lab results.  No med changes.  Her weight is stable doing well not SOB.  Started working with PT yesterday.

## 2018-07-19 DIAGNOSIS — J9601 Acute respiratory failure with hypoxia: Secondary | ICD-10-CM | POA: Diagnosis not present

## 2018-07-21 DIAGNOSIS — J9601 Acute respiratory failure with hypoxia: Secondary | ICD-10-CM | POA: Diagnosis not present

## 2018-07-22 ENCOUNTER — Other Ambulatory Visit: Payer: Self-pay | Admitting: Internal Medicine

## 2018-07-22 DIAGNOSIS — J9601 Acute respiratory failure with hypoxia: Secondary | ICD-10-CM | POA: Diagnosis not present

## 2018-07-22 DIAGNOSIS — J069 Acute upper respiratory infection, unspecified: Secondary | ICD-10-CM

## 2018-07-24 NOTE — Telephone Encounter (Signed)
refilled

## 2018-07-26 DIAGNOSIS — J9601 Acute respiratory failure with hypoxia: Secondary | ICD-10-CM | POA: Diagnosis not present

## 2018-07-28 DIAGNOSIS — J9601 Acute respiratory failure with hypoxia: Secondary | ICD-10-CM | POA: Diagnosis not present

## 2018-07-31 DIAGNOSIS — J9601 Acute respiratory failure with hypoxia: Secondary | ICD-10-CM | POA: Diagnosis not present

## 2018-08-05 ENCOUNTER — Other Ambulatory Visit: Payer: Self-pay | Admitting: Internal Medicine

## 2018-08-05 DIAGNOSIS — G4730 Sleep apnea, unspecified: Secondary | ICD-10-CM | POA: Diagnosis not present

## 2018-08-05 DIAGNOSIS — I5030 Unspecified diastolic (congestive) heart failure: Secondary | ICD-10-CM | POA: Diagnosis not present

## 2018-08-05 DIAGNOSIS — E7800 Pure hypercholesterolemia, unspecified: Secondary | ICD-10-CM

## 2018-08-05 DIAGNOSIS — J984 Other disorders of lung: Secondary | ICD-10-CM | POA: Diagnosis not present

## 2018-08-05 DIAGNOSIS — I270 Primary pulmonary hypertension: Secondary | ICD-10-CM | POA: Diagnosis not present

## 2018-08-08 NOTE — Telephone Encounter (Signed)
refilled

## 2018-09-05 DIAGNOSIS — J984 Other disorders of lung: Secondary | ICD-10-CM | POA: Diagnosis not present

## 2018-09-05 DIAGNOSIS — I5030 Unspecified diastolic (congestive) heart failure: Secondary | ICD-10-CM | POA: Diagnosis not present

## 2018-09-05 DIAGNOSIS — G4730 Sleep apnea, unspecified: Secondary | ICD-10-CM | POA: Diagnosis not present

## 2018-09-05 DIAGNOSIS — I270 Primary pulmonary hypertension: Secondary | ICD-10-CM | POA: Diagnosis not present

## 2018-10-02 DIAGNOSIS — I270 Primary pulmonary hypertension: Secondary | ICD-10-CM | POA: Diagnosis not present

## 2018-10-02 DIAGNOSIS — G4733 Obstructive sleep apnea (adult) (pediatric): Secondary | ICD-10-CM | POA: Diagnosis not present

## 2018-10-02 DIAGNOSIS — G4730 Sleep apnea, unspecified: Secondary | ICD-10-CM | POA: Diagnosis not present

## 2018-10-03 LAB — HM DIABETES EYE EXAM

## 2018-10-05 ENCOUNTER — Encounter: Payer: Self-pay | Admitting: *Deleted

## 2018-10-05 DIAGNOSIS — I5030 Unspecified diastolic (congestive) heart failure: Secondary | ICD-10-CM | POA: Diagnosis not present

## 2018-10-05 DIAGNOSIS — G4730 Sleep apnea, unspecified: Secondary | ICD-10-CM | POA: Diagnosis not present

## 2018-10-05 DIAGNOSIS — J984 Other disorders of lung: Secondary | ICD-10-CM | POA: Diagnosis not present

## 2018-10-05 DIAGNOSIS — I270 Primary pulmonary hypertension: Secondary | ICD-10-CM | POA: Diagnosis not present

## 2018-10-06 NOTE — Progress Notes (Signed)
Order(s) created erroneously. Erroneous order ID: [id number]  Order canceled by: Milas Hock  Order cancel date/time: 10/06/2018 5:44 PM

## 2018-10-06 NOTE — Progress Notes (Signed)
Order(s) created erroneously. Erroneous order ID: [id number]  Order canceled by: Milas Hock  Order cancel date/time: 10/06/2018 5:39 PM

## 2018-10-25 ENCOUNTER — Other Ambulatory Visit: Payer: Self-pay

## 2018-10-25 MED ORDER — METFORMIN HCL 500 MG PO TABS: ORAL_TABLET | ORAL | 0 refills | Status: DC

## 2018-10-25 NOTE — Telephone Encounter (Signed)
metFORMIN (GLUCOPHAGE) 500 MG tablet   Refill request @  [facility] ([address] [place] [phone] (Phone) [phone] (Fax)

## 2018-10-30 ENCOUNTER — Other Ambulatory Visit: Payer: Self-pay | Admitting: Internal Medicine

## 2018-10-30 DIAGNOSIS — I50810 Right heart failure, unspecified: Secondary | ICD-10-CM

## 2018-10-31 NOTE — Telephone Encounter (Signed)
Needs PCP appt 1st available

## 2018-10-31 NOTE — Telephone Encounter (Signed)
The patient has sch an appt with her PCP for 02/26/2019 which is the 1st ava appt for Dr. Shan Levans.

## 2018-11-05 DIAGNOSIS — I270 Primary pulmonary hypertension: Secondary | ICD-10-CM | POA: Diagnosis not present

## 2018-11-05 DIAGNOSIS — J984 Other disorders of lung: Secondary | ICD-10-CM | POA: Diagnosis not present

## 2018-11-05 DIAGNOSIS — I5030 Unspecified diastolic (congestive) heart failure: Secondary | ICD-10-CM | POA: Diagnosis not present

## 2018-11-05 DIAGNOSIS — G4730 Sleep apnea, unspecified: Secondary | ICD-10-CM | POA: Diagnosis not present

## 2018-11-14 DIAGNOSIS — I272 Pulmonary hypertension, unspecified: Secondary | ICD-10-CM | POA: Diagnosis not present

## 2018-11-14 DIAGNOSIS — Z23 Encounter for immunization: Secondary | ICD-10-CM | POA: Diagnosis not present

## 2018-11-14 DIAGNOSIS — G4733 Obstructive sleep apnea (adult) (pediatric): Secondary | ICD-10-CM | POA: Diagnosis not present

## 2018-11-14 DIAGNOSIS — R0600 Dyspnea, unspecified: Secondary | ICD-10-CM | POA: Diagnosis not present

## 2018-12-05 DIAGNOSIS — J984 Other disorders of lung: Secondary | ICD-10-CM | POA: Diagnosis not present

## 2018-12-05 DIAGNOSIS — I5030 Unspecified diastolic (congestive) heart failure: Secondary | ICD-10-CM | POA: Diagnosis not present

## 2018-12-05 DIAGNOSIS — G4730 Sleep apnea, unspecified: Secondary | ICD-10-CM | POA: Diagnosis not present

## 2018-12-05 DIAGNOSIS — I270 Primary pulmonary hypertension: Secondary | ICD-10-CM | POA: Diagnosis not present

## 2018-12-12 ENCOUNTER — Ambulatory Visit: Payer: Medicaid Other | Admitting: Internal Medicine

## 2018-12-12 ENCOUNTER — Encounter: Payer: Self-pay | Admitting: Internal Medicine

## 2018-12-12 VITALS — BP 120/73 | HR 81 | Temp 98.3°F | Ht 63.5 in | Wt 282.4 lb

## 2018-12-12 DIAGNOSIS — I5033 Acute on chronic diastolic (congestive) heart failure: Secondary | ICD-10-CM | POA: Diagnosis not present

## 2018-12-12 DIAGNOSIS — Z9111 Patient's noncompliance with dietary regimen: Secondary | ICD-10-CM

## 2018-12-12 DIAGNOSIS — Z79899 Other long term (current) drug therapy: Secondary | ICD-10-CM | POA: Diagnosis not present

## 2018-12-12 DIAGNOSIS — R32 Unspecified urinary incontinence: Secondary | ICD-10-CM | POA: Insufficient documentation

## 2018-12-12 DIAGNOSIS — N3941 Urge incontinence: Secondary | ICD-10-CM

## 2018-12-12 NOTE — Assessment & Plan Note (Signed)
Increased urinary frequency: Several months of increased urinary frequency with incontinence on occasion with associated increased urge. She is sometimes able to make it to the restroom and at other times the urgency is too severe. She will also have occasional bowl movements with these episodes as well. She feels as if she has decreased  She endorses increased frequency and higher volumes of output. She is unable to empty her bladder and will have breakthrough incontinence.  Weight is up 20lbs today since July. She continues to take her Torsemide 81m BID.  One vaginal delivery x 35+ year prior.  Stress, urge, functional and neurologic incontinence. She appears to have significant symptoms  Plan: We will begin by attempting behavioral changes with Kegel exercises, timed voiding, double voiding and decrease fluid intake.  Patient is to return in 2 to 4 weeks if her symptoms with unimproved or worsened

## 2018-12-12 NOTE — Assessment & Plan Note (Addendum)
Patient continues taking torsemide 80 mg twice daily.  She consumes excessive amounts of fluid per her attestation at minimum a 2 L bottle of diet soda, 4 to 6 glasses of Kool-Aid and 1 to 2 glasses of water.  Her weight appears to increase by approximately 20 pounds since her admission in June.  We have discussed fluid restriction to less than 6 8oz cups of water daily.  I advised her to cut back on her sweetened beverages as well as her diet sodas. She is asymptomatic with regard to the weight gain and exam is remarkable today for mild increased edema.  Plan: BMP today Continue torsemide 80 mg twice daily Continue spironolactone 25 mg daily Continue to follow with a cardiologist

## 2018-12-12 NOTE — Patient Instructions (Signed)
FOLLOW-UP INSTRUCTIONS When: As needed What to bring: All of your medications  I recommend that you utilize a technique called double voiding, wherein after going to the restroom and voiding you weight 2-3 minutes, stand up sit down and attempt to void again. Additionally, I recommend that you perform pelvic floor muscle strengthening exercises commonly called Kegel exercises as well. This is where you attempt to tighten the urinary sphincter and relax it, tighten again and relax and repeat. I also recommend that you decrease the amount of sweet beverages and diet soda that you drink to less than 1 L of diet soda and less than 2 glasses of sweet beverages daily. Please continue taking torsemide 80 mg twice daily Please return if symptoms do not improve or resolve.  Thank you for your visit to the [facility] today. If you have any questions or concerns please call us at [phone].

## 2018-12-12 NOTE — Progress Notes (Signed)
   CC: increased urination  HPI:[name] is a 58 y.o. female who presents for evaluation of increased urination for past several months. Please see individual problem based A/P for details.  Past Medical History:  Diagnosis Date  . Anemia, iron deficiency    secondary to menhorrhagia, on oral iron, also b12 def, getting monthly b12 shots  . CHF (congestive heart failure) ([place])   . Chronic cough    secondary to alleriges and post nasal drip  . Cognitive impairment   . COPD (chronic obstructive pulmonary disease) ([place])   . Cor pulmonale (HCC)    PA Peak pressure 1mHg  . Depression   . Diabetes mellitus    well controlled on metformin  . Diastolic heart failure ([place]   . GERD (gastroesophageal reflux disease)   . H/O mental retardation   . Herpes   . Hyperlipidemia   . Hypertension   . OSA (obstructive sleep apnea)    CPAP  . Pulmonary hypertension ([place]   . Renal disorder   . Restrictive lung disease    PFTs 06/2012 (FVC 54% predicted and FEV1 68% predicted w minimal bronchodilator response).  . Shortness of breath   . Venous stasis ulcer ([place]    chornic, ?followed up at wound care center, multiple courses of antibiotics in past for cellulitis, on lasix   Review of Systems:  ROS negative except as per HPI.  Physical Exam: There were no vitals filed for this visit. General: A/O x4, in no acute distress, afebrile, nondiaphoretic HEENT: PEERL, EMO intact Cardio: RRR, no mrg's  Pulmonary: CTA bilaterally, no wheezing or crackles  Abdomen: Bowel sounds normal, soft, nontender  MSK: BLE nontender, nonedematous GU: No marked vaginal wall prolapse or urethral prolapse on superficial exam today.  No obvious external hemorrhoids Psych: Appropriate affect, not depressed in appearance, engages well  Assessment & Plan:   See Encounters Tab for problem based charting.  Patient discussed with Dr. NDareen Piano

## 2018-12-13 LAB — BMP8+ANION GAP
Anion Gap: 17 mmol/L (ref 10.0–18.0)
BUN/Creatinine Ratio: 22 (ref 9–23)
BUN: 17 mg/dL (ref 6–24)
CO2: 25 mmol/L (ref 20–29)
Calcium: 9.1 mg/dL (ref 8.7–10.2)
Chloride: 102 mmol/L (ref 96–106)
Creatinine, Ser: 0.78 mg/dL (ref 0.57–1.00)
GFR calc Af Amer: 98 mL/min/{1.73_m2} (ref >59)
GFR calc non Af Amer: 85 mL/min/{1.73_m2} (ref >59)
Glucose: 102 mg/dL — ABNORMAL HIGH (ref 65–99)
Potassium: 4 mmol/L (ref 3.5–5.2)
Sodium: 144 mmol/L (ref 134–144)

## 2018-12-13 NOTE — Progress Notes (Signed)
Internal Medicine Clinic Attending  Case discussed with Dr. Harbrecht at the time of the visit.  We reviewed the resident's history and exam and pertinent patient test results.  I agree with the assessment, diagnosis, and plan of care documented in the resident's note.   

## 2019-01-05 DIAGNOSIS — I270 Primary pulmonary hypertension: Secondary | ICD-10-CM | POA: Diagnosis not present

## 2019-01-05 DIAGNOSIS — G4730 Sleep apnea, unspecified: Secondary | ICD-10-CM | POA: Diagnosis not present

## 2019-01-05 DIAGNOSIS — J984 Other disorders of lung: Secondary | ICD-10-CM | POA: Diagnosis not present

## 2019-01-05 DIAGNOSIS — I5030 Unspecified diastolic (congestive) heart failure: Secondary | ICD-10-CM | POA: Diagnosis not present

## 2019-01-17 ENCOUNTER — Other Ambulatory Visit (HOSPITAL_COMMUNITY): Payer: Self-pay

## 2019-01-17 DIAGNOSIS — I270 Primary pulmonary hypertension: Secondary | ICD-10-CM

## 2019-01-17 MED ORDER — SILDENAFIL CITRATE 20 MG PO TABS: 20.0000 mg | ORAL_TABLET | Freq: Three times a day (TID) | ORAL | 3 refills | Status: DC

## 2019-01-24 ENCOUNTER — Other Ambulatory Visit: Payer: Self-pay | Admitting: Internal Medicine

## 2019-01-27 ENCOUNTER — Other Ambulatory Visit: Payer: Self-pay | Admitting: Internal Medicine

## 2019-01-27 DIAGNOSIS — J069 Acute upper respiratory infection, unspecified: Secondary | ICD-10-CM

## 2019-02-05 DIAGNOSIS — J984 Other disorders of lung: Secondary | ICD-10-CM | POA: Diagnosis not present

## 2019-02-05 DIAGNOSIS — G4730 Sleep apnea, unspecified: Secondary | ICD-10-CM | POA: Diagnosis not present

## 2019-02-05 DIAGNOSIS — I270 Primary pulmonary hypertension: Secondary | ICD-10-CM | POA: Diagnosis not present

## 2019-02-05 DIAGNOSIS — I5030 Unspecified diastolic (congestive) heart failure: Secondary | ICD-10-CM | POA: Diagnosis not present

## 2019-02-11 ENCOUNTER — Other Ambulatory Visit: Payer: Self-pay | Admitting: Internal Medicine

## 2019-02-26 ENCOUNTER — Encounter: Payer: Medicaid Other | Admitting: Internal Medicine

## 2019-02-28 ENCOUNTER — Other Ambulatory Visit: Payer: Self-pay | Admitting: Internal Medicine

## 2019-02-28 DIAGNOSIS — K219 Gastro-esophageal reflux disease without esophagitis: Secondary | ICD-10-CM

## 2019-02-28 MED ORDER — PANTOPRAZOLE SODIUM 20 MG PO TBEC: 20.0000 mg | DELAYED_RELEASE_TABLET | Freq: Two times a day (BID) | ORAL | 3 refills | Status: DC

## 2019-02-28 NOTE — Telephone Encounter (Signed)
Needs refill on pantoprazole (PROTONIX) 20 MG tablet  ;pt contact [facility] ([facility]), [place] [facility] [place]

## 2019-03-05 DIAGNOSIS — I5030 Unspecified diastolic (congestive) heart failure: Secondary | ICD-10-CM | POA: Diagnosis not present

## 2019-03-05 DIAGNOSIS — G4730 Sleep apnea, unspecified: Secondary | ICD-10-CM | POA: Diagnosis not present

## 2019-03-05 DIAGNOSIS — J984 Other disorders of lung: Secondary | ICD-10-CM | POA: Diagnosis not present

## 2019-03-05 DIAGNOSIS — I270 Primary pulmonary hypertension: Secondary | ICD-10-CM | POA: Diagnosis not present

## 2019-03-06 ENCOUNTER — Other Ambulatory Visit: Payer: Self-pay | Admitting: Internal Medicine

## 2019-03-09 ENCOUNTER — Other Ambulatory Visit: Payer: Self-pay | Admitting: Internal Medicine

## 2019-03-13 DIAGNOSIS — I5032 Chronic diastolic (congestive) heart failure: Secondary | ICD-10-CM | POA: Diagnosis not present

## 2019-03-13 DIAGNOSIS — I272 Pulmonary hypertension, unspecified: Secondary | ICD-10-CM | POA: Diagnosis not present

## 2019-03-13 DIAGNOSIS — R0609 Other forms of dyspnea: Secondary | ICD-10-CM | POA: Diagnosis not present

## 2019-03-13 DIAGNOSIS — R0602 Shortness of breath: Secondary | ICD-10-CM | POA: Diagnosis not present

## 2019-03-13 DIAGNOSIS — R0600 Dyspnea, unspecified: Secondary | ICD-10-CM | POA: Diagnosis not present

## 2019-03-13 DIAGNOSIS — G4733 Obstructive sleep apnea (adult) (pediatric): Secondary | ICD-10-CM | POA: Diagnosis not present

## 2019-03-16 ENCOUNTER — Telehealth: Payer: Self-pay | Admitting: Internal Medicine

## 2019-03-16 NOTE — Telephone Encounter (Signed)
Called pt informed her she may get the vaccine and gave her the [place] ph for signing up

## 2019-03-16 NOTE — Telephone Encounter (Signed)
Pls ask physician if patient can get the COVID vaccine; [phone]

## 2019-03-16 NOTE — Telephone Encounter (Signed)
Thank you she should definitely get it

## 2019-04-03 DIAGNOSIS — H40013 Open angle with borderline findings, low risk, bilateral: Secondary | ICD-10-CM | POA: Diagnosis not present

## 2019-04-03 DIAGNOSIS — H04123 Dry eye syndrome of bilateral lacrimal glands: Secondary | ICD-10-CM | POA: Diagnosis not present

## 2019-04-05 DIAGNOSIS — I270 Primary pulmonary hypertension: Secondary | ICD-10-CM | POA: Diagnosis not present

## 2019-04-05 DIAGNOSIS — G4730 Sleep apnea, unspecified: Secondary | ICD-10-CM | POA: Diagnosis not present

## 2019-04-05 DIAGNOSIS — I5030 Unspecified diastolic (congestive) heart failure: Secondary | ICD-10-CM | POA: Diagnosis not present

## 2019-04-05 DIAGNOSIS — J984 Other disorders of lung: Secondary | ICD-10-CM | POA: Diagnosis not present

## 2019-04-09 ENCOUNTER — Ambulatory Visit: Payer: Medicaid Other | Admitting: Internal Medicine

## 2019-04-09 ENCOUNTER — Other Ambulatory Visit (HOSPITAL_COMMUNITY)
Admission: RE | Admit: 2019-04-09 | Discharge: 2019-04-09 | Disposition: A | Discharge status: Home / Self Care | Payer: Medicaid Other | Source: Ambulatory Visit | Attending: Internal Medicine | Admitting: Internal Medicine

## 2019-04-09 ENCOUNTER — Encounter: Payer: Self-pay | Admitting: Internal Medicine

## 2019-04-09 VITALS — BP 113/64 | HR 84 | Temp 98.4°F | Wt 295.3 lb

## 2019-04-09 DIAGNOSIS — N898 Other specified noninflammatory disorders of vagina: Secondary | ICD-10-CM | POA: Insufficient documentation

## 2019-04-09 DIAGNOSIS — Z7984 Long term (current) use of oral hypoglycemic drugs: Secondary | ICD-10-CM | POA: Diagnosis not present

## 2019-04-09 DIAGNOSIS — E119 Type 2 diabetes mellitus without complications: Secondary | ICD-10-CM | POA: Diagnosis not present

## 2019-04-09 DIAGNOSIS — K5909 Other constipation: Secondary | ICD-10-CM

## 2019-04-09 DIAGNOSIS — K649 Unspecified hemorrhoids: Secondary | ICD-10-CM | POA: Insufficient documentation

## 2019-04-09 DIAGNOSIS — E1142 Type 2 diabetes mellitus with diabetic polyneuropathy: Secondary | ICD-10-CM

## 2019-04-09 LAB — POCT GLYCOSYLATED HEMOGLOBIN (HGB A1C): Hemoglobin A1C: 5.5 % (ref 4.0–5.6)

## 2019-04-09 LAB — GLUCOSE, CAPILLARY: Glucose-Capillary: 99 mg/dL (ref 70–99)

## 2019-04-09 NOTE — Progress Notes (Addendum)
CC: Vaginal pruritis, hemorrhoidal bleeding, T2DM  HPI:  [name] is a 59 y.o. female with PMH below.  Today we will address Vaginal pruritis, hemorrhoidal bleeding, T2DM  Please see A&P for status of the patient's chronic medical conditions  Past Medical History:  Diagnosis Date  . Anemia, iron deficiency    secondary to menhorrhagia, on oral iron, also b12 def, getting monthly b12 shots  . CHF (congestive heart failure) ([place])   . Chronic cough    secondary to alleriges and post nasal drip  . Cognitive impairment   . COPD (chronic obstructive pulmonary disease) ([place])   . Cor pulmonale (HCC)    PA Peak pressure 53mHg  . Depression   . Diabetes mellitus    well controlled on metformin  . Diastolic heart failure ([place]   . GERD (gastroesophageal reflux disease)   . H/O mental retardation   . Herpes   . Hyperlipidemia   . Hypertension   . OSA (obstructive sleep apnea)    CPAP  . Pulmonary hypertension ([place]   . Renal disorder   . Restrictive lung disease    PFTs 06/2012 (FVC 54% predicted and FEV1 68% predicted w minimal bronchodilator response).  . Shortness of breath   . Venous stasis ulcer (HCC)    chornic, ?followed up at wound care center, multiple courses of antibiotics in past for cellulitis, on lasix   Review of Systems:    ROS: Pulmonary: pt denies increased work of breathing  Cardiac: pt denies palpitations, chest pain,  Abdominal: pt denies abdominal pain, nausea, vomiting, or diarrhea   Physical Exam:  Vitals:   04/09/19 1352  BP: 113/64  Pulse: 84  Temp: 98.4 F (36.9 C)  TempSrc: Oral  SpO2: 95%  Weight: 295 lb 4.8 oz (133.9 kg)   Cardiac:  normal rate and rhythm, clear s1 and s2, no murmurs, rubs or gallops, Chronic bilateral LE edema with stasis dermatitis Pulmonary: CTAB, not in distress Abdominal: non distended abdomen, soft and nontender Psych: Alert, conversant, in good spirits GU: She has no signs of a vesicular rash on  examination there are signs of excoriation.  She does have vaginal discharge Anus: DRE does demonstrate a hemorrhoid, there was no visible bleeding, no anal fissure appreciated. I did not perform hemoccult.  None seen on last colonoscopy but this was years ago.  Social History   Socioeconomic History  . Marital status: Single    Spouse name: Not on file  . Number of children: Not on file  . Years of education: Not on file  . Highest education level: Not on file  Occupational History  . Not on file  Tobacco Use  . Smoking status: Never Smoker  . Smokeless tobacco: Never Used  Substance and Sexual Activity  . Alcohol use: No    Alcohol/week: 0.0 standard drinks  . Drug use: No  . Sexual activity: Never  Other Topics Concern  . Not on file  Social History Narrative  . Not on file   Social Determinants of Health   Financial Resource Strain: Low Risk   . Difficulty of Paying Living Expenses: Not very hard  Food Insecurity: No Food Insecurity  . Worried About RCharity fundraiserin the Last Year: Never true  . Ran Out of Food in the Last Year: Never true  Transportation Needs: No Transportation Needs  . Lack of Transportation (Medical): No  . Lack of Transportation (Non-Medical): No  Physical Activity: Inactive  . Days  of Exercise per Week: 0 days  . Minutes of Exercise per Session: 0 min  Stress: No Stress Concern Present  . Feeling of Stress : Not at all  Social Connections: Moderately Isolated  . Frequency of Communication with Friends and Family: Once a week  . Frequency of Social Gatherings with Friends and Family: Once a week  . Attends Religious Services: More than 4 times per year  . Active Member of Clubs or Organizations: No  . Attends Archivist Meetings: Never  . Marital Status: Never married  Intimate Partner Violence: Not At Risk  . Fear of Current or Ex-Partner: No  . Emotionally Abused: No  . Physically Abused: No  . Sexually Abused: No     Family History  Problem Relation Age of Onset  . Kidney Stones Son   . Mental illness Sister   . Bipolar disorder Sister   . Mental retardation Brother   . Hyperlipidemia Mother   . Breast cancer Maternal Aunt     Assessment & Plan:   See Encounters Tab for problem based charting.  Patient discussed with Dr. Angelia Mould

## 2019-04-09 NOTE — Assessment & Plan Note (Addendum)
Hemorrhoidal bleeding when she wipes.  No dark tarry stools or bloody bowel movements. She does not report constipation lately but does have chronic constipation said stools are regular mostly every day.  No stool softeners lately. DRE does demonstrate a hemorrhoid, there was no visible bleeding, no anal fissure appreciated. I did not perform hemoccult.  None seen on last colonoscopy but this was years ago.    -instructed pt to use over the counter moist wipes instead of toilet paper, avoid spending long periods of time on the toilet   -monitor her response if this continues to be a problem I will refer her to GI

## 2019-04-09 NOTE — Assessment & Plan Note (Addendum)
Pt reports vaginal burning and pruritis, no dysuria, no intercourse since late 20's, she was tested and found to have an equivocal result at that time and was treated empirically per pt.  In 2018 she had similar symptoms and was  sarted on diflucan and ultimately referred to OBGYN where they felt she did have a herpes outbreak and started on valacyclovir there per the patient.  She has no signs of a vesicular rash on examination there are signs of excoriation.  She does have vaginal discharge.  Several attempts to obtain a urine sample were unsuccessful and unfortunately pt emptied her bladder completely on the last attempt.    -wet prep today, follow up on results

## 2019-04-09 NOTE — Patient Instructions (Signed)
Ms. Bitterman, I have ordered some tests to determine the cause of your vaginal discomfort.  I will call you with the results and any necessary prescriptions.

## 2019-04-09 NOTE — Assessment & Plan Note (Signed)
Lab Results  Component Value Date   HGBA1C 5.5 04/09/2019   Pt still taking metformin by choice.    -move a1c checks to once per year

## 2019-04-11 ENCOUNTER — Telehealth: Payer: Self-pay | Admitting: Internal Medicine

## 2019-04-11 DIAGNOSIS — B373 Candidiasis of vulva and vagina: Secondary | ICD-10-CM

## 2019-04-11 DIAGNOSIS — B3731 Acute candidiasis of vulva and vagina: Secondary | ICD-10-CM

## 2019-04-11 LAB — CERVICOVAGINAL ANCILLARY ONLY
Bacterial Vaginitis (gardnerella): NEGATIVE
Candida Glabrata: NEGATIVE
Candida Vaginitis: POSITIVE — AB
Chlamydia: NEGATIVE
Comment: NEGATIVE
Comment: NEGATIVE
Comment: NEGATIVE
Comment: NEGATIVE
Comment: NEGATIVE
Comment: NORMAL
Neisseria Gonorrhea: NEGATIVE
Trichomonas: NEGATIVE

## 2019-04-11 MED ORDER — FLUCONAZOLE 150 MG PO TABS: 150.0000 mg | ORAL_TABLET | ORAL | 0 refills | Status: DC

## 2019-04-11 NOTE — Progress Notes (Signed)
Internal Medicine Clinic Attending  Case discussed with Dr. Winfrey  at the time of the visit.  We reviewed the resident's history and exam and pertinent patient test results.  I agree with the assessment, diagnosis, and plan of care documented in the resident's note.  

## 2019-04-11 NOTE — Telephone Encounter (Signed)
Spoke with pt about wet prep results and vulvovaginal candidiasis.  Discussed treatment with diflucan.

## 2019-04-26 ENCOUNTER — Encounter: Payer: Self-pay | Admitting: *Deleted

## 2019-05-02 ENCOUNTER — Inpatient Hospital Stay (HOSPITAL_COMMUNITY)
Admission: EM | Admit: 2019-05-02 | Discharge: 2019-05-24 | DRG: 871 | Disposition: A | Discharge status: Skilled Nursing Facility | Payer: Medicaid Other | Attending: Internal Medicine | Admitting: Internal Medicine

## 2019-05-02 ENCOUNTER — Encounter (HOSPITAL_COMMUNITY): Payer: Self-pay | Admitting: Emergency Medicine

## 2019-05-02 ENCOUNTER — Emergency Department (HOSPITAL_COMMUNITY): Payer: Medicaid Other

## 2019-05-02 ENCOUNTER — Other Ambulatory Visit: Payer: Self-pay

## 2019-05-02 DIAGNOSIS — A419 Sepsis, unspecified organism: Secondary | ICD-10-CM | POA: Diagnosis not present

## 2019-05-02 DIAGNOSIS — Z7984 Long term (current) use of oral hypoglycemic drugs: Secondary | ICD-10-CM

## 2019-05-02 DIAGNOSIS — I959 Hypotension, unspecified: Secondary | ICD-10-CM | POA: Diagnosis not present

## 2019-05-02 DIAGNOSIS — A408 Other streptococcal sepsis: Principal | ICD-10-CM | POA: Diagnosis present

## 2019-05-02 DIAGNOSIS — R6521 Severe sepsis with septic shock: Secondary | ICD-10-CM | POA: Diagnosis not present

## 2019-05-02 DIAGNOSIS — R7881 Bacteremia: Secondary | ICD-10-CM | POA: Diagnosis not present

## 2019-05-02 DIAGNOSIS — R509 Fever, unspecified: Secondary | ICD-10-CM | POA: Diagnosis not present

## 2019-05-02 DIAGNOSIS — K219 Gastro-esophageal reflux disease without esophagitis: Secondary | ICD-10-CM | POA: Diagnosis present

## 2019-05-02 DIAGNOSIS — G9341 Metabolic encephalopathy: Secondary | ICD-10-CM | POA: Diagnosis present

## 2019-05-02 DIAGNOSIS — M2550 Pain in unspecified joint: Secondary | ICD-10-CM | POA: Diagnosis not present

## 2019-05-02 DIAGNOSIS — E870 Hyperosmolality and hypernatremia: Secondary | ICD-10-CM | POA: Diagnosis not present

## 2019-05-02 DIAGNOSIS — J9622 Acute and chronic respiratory failure with hypercapnia: Secondary | ICD-10-CM | POA: Diagnosis present

## 2019-05-02 DIAGNOSIS — B955 Unspecified streptococcus as the cause of diseases classified elsewhere: Secondary | ICD-10-CM | POA: Diagnosis not present

## 2019-05-02 DIAGNOSIS — I472 Ventricular tachycardia: Secondary | ICD-10-CM | POA: Diagnosis not present

## 2019-05-02 DIAGNOSIS — I5033 Acute on chronic diastolic (congestive) heart failure: Secondary | ICD-10-CM | POA: Diagnosis not present

## 2019-05-02 DIAGNOSIS — Z7189 Other specified counseling: Secondary | ICD-10-CM

## 2019-05-02 DIAGNOSIS — I272 Pulmonary hypertension, unspecified: Secondary | ICD-10-CM | POA: Diagnosis not present

## 2019-05-02 DIAGNOSIS — J90 Pleural effusion, not elsewhere classified: Secondary | ICD-10-CM | POA: Diagnosis not present

## 2019-05-02 DIAGNOSIS — E1142 Type 2 diabetes mellitus with diabetic polyneuropathy: Secondary | ICD-10-CM | POA: Diagnosis present

## 2019-05-02 DIAGNOSIS — I2510 Atherosclerotic heart disease of native coronary artery without angina pectoris: Secondary | ICD-10-CM | POA: Diagnosis not present

## 2019-05-02 DIAGNOSIS — Z20822 Contact with and (suspected) exposure to covid-19: Secondary | ICD-10-CM | POA: Diagnosis not present

## 2019-05-02 DIAGNOSIS — I878 Other specified disorders of veins: Secondary | ICD-10-CM | POA: Diagnosis not present

## 2019-05-02 DIAGNOSIS — R0902 Hypoxemia: Secondary | ICD-10-CM | POA: Diagnosis not present

## 2019-05-02 DIAGNOSIS — J189 Pneumonia, unspecified organism: Secondary | ICD-10-CM | POA: Diagnosis not present

## 2019-05-02 DIAGNOSIS — R197 Diarrhea, unspecified: Secondary | ICD-10-CM

## 2019-05-02 DIAGNOSIS — Z79899 Other long term (current) drug therapy: Secondary | ICD-10-CM

## 2019-05-02 DIAGNOSIS — R778 Other specified abnormalities of plasma proteins: Secondary | ICD-10-CM | POA: Diagnosis not present

## 2019-05-02 DIAGNOSIS — E538 Deficiency of other specified B group vitamins: Secondary | ICD-10-CM | POA: Diagnosis present

## 2019-05-02 DIAGNOSIS — L03115 Cellulitis of right lower limb: Secondary | ICD-10-CM | POA: Diagnosis not present

## 2019-05-02 DIAGNOSIS — Z7401 Bed confinement status: Secondary | ICD-10-CM | POA: Diagnosis not present

## 2019-05-02 DIAGNOSIS — G4733 Obstructive sleep apnea (adult) (pediatric): Secondary | ICD-10-CM | POA: Diagnosis present

## 2019-05-02 DIAGNOSIS — N179 Acute kidney failure, unspecified: Secondary | ICD-10-CM | POA: Diagnosis present

## 2019-05-02 DIAGNOSIS — R0602 Shortness of breath: Secondary | ICD-10-CM | POA: Diagnosis not present

## 2019-05-02 DIAGNOSIS — E1122 Type 2 diabetes mellitus with diabetic chronic kidney disease: Secondary | ICD-10-CM | POA: Diagnosis present

## 2019-05-02 DIAGNOSIS — I2721 Secondary pulmonary arterial hypertension: Secondary | ICD-10-CM | POA: Diagnosis present

## 2019-05-02 DIAGNOSIS — E872 Acidosis: Secondary | ICD-10-CM | POA: Diagnosis present

## 2019-05-02 DIAGNOSIS — J440 Chronic obstructive pulmonary disease with acute lower respiratory infection: Secondary | ICD-10-CM | POA: Diagnosis present

## 2019-05-02 DIAGNOSIS — I248 Other forms of acute ischemic heart disease: Secondary | ICD-10-CM | POA: Diagnosis present

## 2019-05-02 DIAGNOSIS — R000 Tachycardia, unspecified: Secondary | ICD-10-CM | POA: Diagnosis not present

## 2019-05-02 DIAGNOSIS — I471 Supraventricular tachycardia: Secondary | ICD-10-CM | POA: Diagnosis not present

## 2019-05-02 DIAGNOSIS — R32 Unspecified urinary incontinence: Secondary | ICD-10-CM | POA: Diagnosis present

## 2019-05-02 DIAGNOSIS — T80211A Bloodstream infection due to central venous catheter, initial encounter: Secondary | ICD-10-CM | POA: Diagnosis not present

## 2019-05-02 DIAGNOSIS — I2609 Other pulmonary embolism with acute cor pulmonale: Secondary | ICD-10-CM | POA: Diagnosis not present

## 2019-05-02 DIAGNOSIS — Y848 Other medical procedures as the cause of abnormal reaction of the patient, or of later complication, without mention of misadventure at the time of the procedure: Secondary | ICD-10-CM | POA: Diagnosis present

## 2019-05-02 DIAGNOSIS — E669 Obesity, unspecified: Secondary | ICD-10-CM | POA: Diagnosis present

## 2019-05-02 DIAGNOSIS — J9690 Respiratory failure, unspecified, unspecified whether with hypoxia or hypercapnia: Secondary | ICD-10-CM | POA: Diagnosis not present

## 2019-05-02 DIAGNOSIS — J9621 Acute and chronic respiratory failure with hypoxia: Secondary | ICD-10-CM | POA: Diagnosis not present

## 2019-05-02 DIAGNOSIS — R570 Cardiogenic shock: Secondary | ICD-10-CM | POA: Diagnosis not present

## 2019-05-02 DIAGNOSIS — J9620 Acute and chronic respiratory failure, unspecified whether with hypoxia or hypercapnia: Secondary | ICD-10-CM | POA: Diagnosis present

## 2019-05-02 DIAGNOSIS — R0989 Other specified symptoms and signs involving the circulatory and respiratory systems: Secondary | ICD-10-CM | POA: Diagnosis not present

## 2019-05-02 DIAGNOSIS — I130 Hypertensive heart and chronic kidney disease with heart failure and stage 1 through stage 4 chronic kidney disease, or unspecified chronic kidney disease: Secondary | ICD-10-CM | POA: Diagnosis present

## 2019-05-02 DIAGNOSIS — F79 Unspecified intellectual disabilities: Secondary | ICD-10-CM | POA: Diagnosis present

## 2019-05-02 DIAGNOSIS — I5082 Biventricular heart failure: Secondary | ICD-10-CM | POA: Diagnosis present

## 2019-05-02 DIAGNOSIS — Z6841 Body Mass Index (BMI) 40.0 and over, adult: Secondary | ICD-10-CM

## 2019-05-02 DIAGNOSIS — R579 Shock, unspecified: Secondary | ICD-10-CM | POA: Diagnosis present

## 2019-05-02 DIAGNOSIS — E785 Hyperlipidemia, unspecified: Secondary | ICD-10-CM | POA: Diagnosis not present

## 2019-05-02 DIAGNOSIS — J181 Lobar pneumonia, unspecified organism: Secondary | ICD-10-CM | POA: Diagnosis not present

## 2019-05-02 DIAGNOSIS — K766 Portal hypertension: Secondary | ICD-10-CM | POA: Diagnosis not present

## 2019-05-02 DIAGNOSIS — Z452 Encounter for adjustment and management of vascular access device: Secondary | ICD-10-CM

## 2019-05-02 DIAGNOSIS — J9600 Acute respiratory failure, unspecified whether with hypoxia or hypercapnia: Secondary | ICD-10-CM | POA: Diagnosis not present

## 2019-05-02 DIAGNOSIS — I214 Non-ST elevation (NSTEMI) myocardial infarction: Secondary | ICD-10-CM | POA: Diagnosis not present

## 2019-05-02 DIAGNOSIS — Z7901 Long term (current) use of anticoagulants: Secondary | ICD-10-CM

## 2019-05-02 DIAGNOSIS — A09 Infectious gastroenteritis and colitis, unspecified: Secondary | ICD-10-CM | POA: Diagnosis not present

## 2019-05-02 DIAGNOSIS — I480 Paroxysmal atrial fibrillation: Secondary | ICD-10-CM | POA: Diagnosis present

## 2019-05-02 DIAGNOSIS — E876 Hypokalemia: Secondary | ICD-10-CM | POA: Diagnosis not present

## 2019-05-02 DIAGNOSIS — I10 Essential (primary) hypertension: Secondary | ICD-10-CM | POA: Diagnosis not present

## 2019-05-02 DIAGNOSIS — N189 Chronic kidney disease, unspecified: Secondary | ICD-10-CM | POA: Diagnosis present

## 2019-05-02 DIAGNOSIS — T80219A Unspecified infection due to central venous catheter, initial encounter: Secondary | ICD-10-CM | POA: Diagnosis present

## 2019-05-02 DIAGNOSIS — J984 Other disorders of lung: Secondary | ICD-10-CM | POA: Diagnosis present

## 2019-05-02 DIAGNOSIS — E119 Type 2 diabetes mellitus without complications: Secondary | ICD-10-CM

## 2019-05-02 DIAGNOSIS — R6520 Severe sepsis without septic shock: Secondary | ICD-10-CM | POA: Diagnosis not present

## 2019-05-02 DIAGNOSIS — J9809 Other diseases of bronchus, not elsewhere classified: Secondary | ICD-10-CM | POA: Diagnosis present

## 2019-05-02 DIAGNOSIS — Z95828 Presence of other vascular implants and grafts: Secondary | ICD-10-CM

## 2019-05-02 LAB — COMPREHENSIVE METABOLIC PANEL
ALT: 17 U/L (ref 0–44)
AST: 29 U/L (ref 15–41)
Albumin: 3.7 g/dL (ref 3.5–5.0)
Alkaline Phosphatase: 73 U/L (ref 38–126)
Anion gap: 12 (ref 5–15)
BUN: 12 mg/dL (ref 6–20)
CO2: 27 mmol/L (ref 22–32)
Calcium: 8.8 mg/dL — ABNORMAL LOW (ref 8.9–10.3)
Chloride: 101 mmol/L (ref 98–111)
Creatinine, Ser: 0.89 mg/dL (ref 0.44–1.00)
GFR calc Af Amer: >60 mL/min (ref >60)
GFR calc non Af Amer: >60 mL/min (ref >60)
Glucose, Bld: 104 mg/dL — ABNORMAL HIGH (ref 70–99)
Potassium: 2.7 mmol/L — CL (ref 3.5–5.1)
Sodium: 140 mmol/L (ref 135–145)
Total Bilirubin: 0.8 mg/dL (ref 0.3–1.2)
Total Protein: 7.5 g/dL (ref 6.5–8.1)

## 2019-05-02 LAB — CBC WITH DIFFERENTIAL/PLATELET
Abs Immature Granulocytes: 0.19 10*3/uL — ABNORMAL HIGH (ref 0.00–0.07)
Basophils Absolute: 0 10*3/uL (ref 0.0–0.1)
Basophils Relative: 0 %
Eosinophils Absolute: 0.1 10*3/uL (ref 0.0–0.5)
Eosinophils Relative: 0 %
HCT: 44.8 % (ref 36.0–46.0)
Hemoglobin: 14.1 g/dL (ref 12.0–15.0)
Immature Granulocytes: 1 %
Lymphocytes Relative: 3 %
Lymphs Abs: 0.6 10*3/uL — ABNORMAL LOW (ref 0.7–4.0)
MCH: 27.8 pg (ref 26.0–34.0)
MCHC: 31.5 g/dL (ref 30.0–36.0)
MCV: 88.2 fL (ref 80.0–100.0)
Monocytes Absolute: 0.8 10*3/uL (ref 0.1–1.0)
Monocytes Relative: 4 %
Neutro Abs: 18.4 10*3/uL — ABNORMAL HIGH (ref 1.7–7.7)
Neutrophils Relative %: 92 %
Platelets: 412 10*3/uL — ABNORMAL HIGH (ref 150–400)
RBC: 5.08 MIL/uL (ref 3.87–5.11)
RDW: 16.2 % — ABNORMAL HIGH (ref 11.5–15.5)
WBC: 20 10*3/uL — ABNORMAL HIGH (ref 4.0–10.5)
nRBC: 0 % (ref 0.0–0.2)

## 2019-05-02 LAB — PROTIME-INR
INR: 1.3 — ABNORMAL HIGH (ref 0.8–1.2)
Prothrombin Time: 15.6 seconds — ABNORMAL HIGH (ref 11.4–15.2)

## 2019-05-02 LAB — I-STAT BETA HCG BLOOD, ED (MC, WL, AP ONLY): I-stat hCG, quantitative: <5 m[IU]/mL (ref <5)

## 2019-05-02 LAB — LACTIC ACID, PLASMA
Lactic Acid, Venous: 3.1 mmol/L (ref 0.5–1.9)
Lactic Acid, Venous: 3.1 mmol/L (ref 0.5–1.9)

## 2019-05-02 LAB — CBG MONITORING, ED: Glucose-Capillary: 118 mg/dL — ABNORMAL HIGH (ref 70–99)

## 2019-05-02 LAB — RESPIRATORY PANEL BY RT PCR (FLU A&B, COVID)
Influenza A by PCR: NEGATIVE
Influenza B by PCR: NEGATIVE
SARS Coronavirus 2 by RT PCR: NEGATIVE

## 2019-05-02 LAB — TROPONIN I (HIGH SENSITIVITY): Troponin I (High Sensitivity): 886 ng/L (ref <18)

## 2019-05-02 MED ORDER — VANCOMYCIN HCL 10 G IV SOLR
2500.0000 mg | Freq: Once | INTRAVENOUS | Status: AC
  Administered 2019-05-02: 2500 mg via INTRAVENOUS
  Filled 2019-05-02: qty 2500

## 2019-05-02 MED ORDER — VANCOMYCIN HCL 1250 MG/250ML IV SOLN
1250.0000 mg | Freq: Two times a day (BID) | INTRAVENOUS | Status: DC
  Filled 2019-05-02: qty 250

## 2019-05-02 MED ORDER — DOCUSATE SODIUM 100 MG PO CAPS: 100.0000 mg | ORAL_CAPSULE | Freq: Two times a day (BID) | ORAL | Status: DC | PRN

## 2019-05-02 MED ORDER — SODIUM CHLORIDE 0.9 % IV SOLN
2.0000 g | Freq: Once | INTRAVENOUS | Status: AC
  Administered 2019-05-02: 2 g via INTRAVENOUS
  Filled 2019-05-02: qty 2

## 2019-05-02 MED ORDER — POTASSIUM CHLORIDE 10 MEQ/100ML IV SOLN
10.0000 meq | INTRAVENOUS | Status: AC
  Administered 2019-05-02 – 2019-05-03 (×3): 10 meq via INTRAVENOUS
  Filled 2019-05-02 (×3): qty 100

## 2019-05-02 MED ORDER — SODIUM CHLORIDE 0.9 % IV BOLUS
250.0000 mL | Freq: Once | INTRAVENOUS | Status: AC
  Administered 2019-05-02: 250 mL via INTRAVENOUS

## 2019-05-02 MED ORDER — TREPROSTINIL 100 MG/20ML IJ SOLN
81.0000 ng/kg/min | INTRAVENOUS | Status: AC
  Administered 2019-05-02: 81 ng/kg/min via INTRAVENOUS
  Filled 2019-05-02: qty 7

## 2019-05-02 MED ORDER — INSULIN ASPART 100 UNIT/ML ~~LOC~~ SOLN
0.0000 [IU] | SUBCUTANEOUS | Status: DC
  Administered 2019-05-03: 3 [IU] via SUBCUTANEOUS
  Administered 2019-05-03: 4 [IU] via SUBCUTANEOUS
  Administered 2019-05-04 – 2019-05-08 (×6): 3 [IU] via SUBCUTANEOUS
  Administered 2019-05-09: 20:00:00 4 [IU] via SUBCUTANEOUS
  Administered 2019-05-09: 3 [IU] via SUBCUTANEOUS
  Administered 2019-05-09: 15:00:00 4 [IU] via SUBCUTANEOUS
  Administered 2019-05-09: 23:00:00 3 [IU] via SUBCUTANEOUS
  Administered 2019-05-10: 20:00:00 4 [IU] via SUBCUTANEOUS

## 2019-05-02 MED ORDER — HEPARIN SODIUM (PORCINE) 5000 UNIT/ML IJ SOLN: 5000.0000 [IU] | Freq: Three times a day (TID) | INTRAMUSCULAR | Status: DC

## 2019-05-02 MED ORDER — SODIUM CHLORIDE 0.9 % IV BOLUS (SEPSIS): 500.0000 mL | Freq: Once | INTRAVENOUS | Status: DC

## 2019-05-02 MED ORDER — VANCOMYCIN HCL IN DEXTROSE 1-5 GM/200ML-% IV SOLN: 1000.0000 mg | Freq: Once | INTRAVENOUS | Status: DC

## 2019-05-02 MED ORDER — SODIUM CHLORIDE 0.9 % IV SOLN: 2.0000 g | Freq: Three times a day (TID) | INTRAVENOUS | Status: DC

## 2019-05-02 MED ORDER — POLYETHYLENE GLYCOL 3350 17 G PO PACK: 17.0000 g | PACK | Freq: Every day | ORAL | Status: DC | PRN

## 2019-05-02 MED ORDER — SODIUM CHLORIDE 0.9 % IV BOLUS
1000.0000 mL | Freq: Once | INTRAVENOUS | Status: AC
  Administered 2019-05-02: 1000 mL via INTRAVENOUS

## 2019-05-02 MED ORDER — POTASSIUM CHLORIDE CRYS ER 20 MEQ PO TBCR
40.0000 meq | EXTENDED_RELEASE_TABLET | Freq: Once | ORAL | Status: DC
  Filled 2019-05-02: qty 2

## 2019-05-02 MED ORDER — METRONIDAZOLE IN NACL 5-0.79 MG/ML-% IV SOLN
500.0000 mg | Freq: Once | INTRAVENOUS | Status: AC
  Administered 2019-05-02: 500 mg via INTRAVENOUS
  Filled 2019-05-02: qty 100

## 2019-05-02 MED ORDER — ACETAMINOPHEN 500 MG PO TABS
1000.0000 mg | ORAL_TABLET | Freq: Once | ORAL | Status: AC
  Administered 2019-05-02: 21:00:00 1000 mg via ORAL
  Filled 2019-05-02: qty 2

## 2019-05-02 NOTE — Plan of Care (Deleted)
MICU Attestation  Patient seen and examined and relevant ancillary tests reviewed.  I agree with the assessment and plan of care as outlined by Montey Hora, PA. This patient was not seen as a shared visit. The following reflects my independent critical care time.  [name] is a 59 y.o. female with h/o severe PAH on Remodulin and sildenafil (mean PAP 54, PCW 12), OSA, morbid obesity, DM2, CKD presenting with fever and productive cough. Hypotensive, tachycardic and hypoxemic above baseline on presentation. CXR with enlarged central vessels and cardiomegaly which is stable from prior without focal consolidation, perhaps subtle retrocardiac air bronchograms could suggest LLL pneumonia. She also has pain and redness in her right shin that is hot to the touch in comparison with her left shin. Lungs are clear to auscultation. Abdomen is large and distended. Remodulin infusing through tunneled catheter.  Blood pressure (!) 81/55, pulse (!) 117, temperature (!) 103 F (39.4 C), temperature source Oral, resp. rate (!) 30, last menstrual period 02/19/2011, SpO2 93 %.  1. Acute on chronic hypoxic respiratory failure 2. Severe sepsis 2/2 CAP 3. Severe pulmonary arterial hypertension 4. Lactic acidosis 5. Troponinemia 6. Hypokalemia 7. DM2  - CAP coverage - Would add SSTI coverage for possible cellulitis with vancomycin - Check RVP, sputum culture - Place arterial line - Gentle fluids for hypotension, caution with over-resuscitation - Continue Remodulin; hold sildenafil in setting of hypotension - Avoid NIPPV; would escalate to [facility] if further oxygen support needed - Trend troponin, obtain TTE - Trend lactate - Replete potassium - Switch home Xarelto to Lovenox 1 mg/kg BID  This patient is critically ill with multiple organ system failure; which, requires frequent high complexity decision making, assessment, support, evaluation, and titration of therapies. This was completed through the application  of advanced monitoring technologies and extensive interpretation of multiple databases. During this encounter critical care time was devoted to patient care services described in this note for 45 minutes.  Otelia Limes, MD 05/02/19 11:56 PM

## 2019-05-02 NOTE — H&P (Signed)
NAME:  [name], MRN:  [id number], DOB:  15-Feb-1960, LOS: 0 ADMISSION DATE:  05/02/2019, CONSULTATION DATE:  05/02/19 REFERRING MD:  Long  CHIEF COMPLAINT:  Dyspnea, weakness   Brief History   [name] is a 59 y.o. female who was admitted with sepsis 2/2 CAP and possible RLE cellulitis along with AoC hypoxic respiratory failure.  Has underlying hx of severe PAH on treprostinil and sildenafil (followed by Dr. Haroldine Laws and Dr. Gilles Chiquito at [facility]).  History of present illness   [name] is a 59 y.o. female who has a PMH including but not limited to [place] on treprostinol, CHF, PAF on xarelto, Morbid obesity (see "past medical history" for rest).  She presented to [facility]/28 with diarrhea and generalized weakness for a few days.  She states diarrhea has occurred "for a few days" but over the past 1 - 2 days, she also began to develop chills and generalized weakness.  Denies chest pain, cough, N/V, abd pain, myalgias.  In ED, she was found to be hypoxic to the 80's despite her baseline of 4L O2.  She was increased to 8L with improvement in sats to low 90's.  She was also hypotensive to the 70's.  She was started on gentle IVF hydration given her hx of PAH and current respiratory failure.  CXR showed possible retrocardiac infiltrate and she was febrile to 103. She was also found to have findings c/w RLE cellulitis (has venous stasis change to BLE's; however, RLE warmer compared to LLE).  PCCM called for admission.  Past Medical History  has ANEMIA, B12 DEFICIENCY; Obstructive sleep apnea; GERD; Obesity; Diabetes ([place]); Hyperlipidemia; Allergic rhinitis; Right heart failure, NYHA class 3 ([place]); Pulmonary hypertension ([place]); Atrial flutter ([place]); Acute on chronic diastolic (congestive) heart failure ([place]); Peripheral neuropathy ([place]); Healthcare maintenance; Rash; Postmenopausal vaginal bleeding; Lower extremity edema; Ovarian cyst; Hematoma of arm, left, subsequent encounter; Abdominal muscle  strain, initial encounter; Acute on chronic respiratory failure ([place]); CAP (community acquired pneumonia); Atelectasis; Urinary incontinence; Bleeding hemorrhoid; and Vaginal itching on their problem list.  Significant Hospital Events   4/28 > admit.  Consults:  Heart Failure.  Procedures:  Art line pending 4/28 >   Significant Diagnostic Tests:  CXR 4/28 > possible retrocardiac infiltrate.  Micro Data:  Flu 4/28 > neg. COVID 4/28 > neg. Blood 4/28 >  C.Diff 4/28 >  RVP 4/28 >   Antimicrobials:  Vanc 4/28 >  Ceftriaxone 4/28 >  Azithromycin 4/28 >   Interim history/subjective:  Comfortable on 8L.  SBP 80s.  Objective:  Blood pressure (!) 81/55, pulse (!) 117, temperature (!) 103 F (39.4 C), temperature source Oral, resp. rate (!) 30, last menstrual period 02/19/2011, SpO2 93 %.       No intake or output data in the 24 hours ending 05/02/19 2339 There were no vitals filed for this visit.  Examination: General: Morbidly obese female, in NAD. Neuro: A&O x 3, no deficits. HEENT: [place]/AT. Sclerae anicteric.  EOMI. Cardiovascular: RRR, no M/R/G.  Lungs: Respirations even and unlabored.  Diminished bilaterally. Abdomen: Morbidly obese.  BS x 4, soft, NT/ND.  Musculoskeletal: No gross deformities, venous stasis changes bilaterally with erythema.  RLE warmer and more erythematous vs LLE. Skin: Intact, warm, no rashes.  Assessment & Plan:   Acute on chronic hypoxic respiratory failure - presumed due to CAP. Continue supplemental O2 as needed to maintain SpO2 > 88%. - Continue CAP coverage. - Assess RVP and follow blood cultures. -  Push IS.  Severe underlying PAH - on treprostinil and sildenafil and followed by Dr. Haroldine Laws and Dr. Gilles Chiquito at [facility].  RHC from 06/27/18 with PA 54, PCW 12, PA sat 59% and 63%. - Continue home treprostinil and sildenafil. - Day team to please consult heart failure team.  Probable RLE cellulitis. - Empiric vanc. - Follow cultures.  Sepsis  - 2/2 above. - Gentle IVF hydration but caution to not tip her over the edge from a respiratory standpoint. - Place art line for more accurate readings.  Elevated troponin - unclear etiology thus far. Hx AoC dCHF with cor pulmonale, PAF. - Heparin in lieu of home xarelto. - Trend troponin. - Assess BNP. - Hold diuresis given soft BP's (on home torsemide, spironolactone). - Assess echo.  Severe hypokalemia - presumed 2/2 diarrhea.  S/p 3 runs K in ED. - Additional 40 mEq K PO. - Follow BMP.  Hx DM. - SSI. - Hold home metformin.  Best Practice:  Diet: NPO. Pain/Anxiety/Delirium protocol (if indicated): N/A. VAP protocol (if indicated): N/A. DVT prophylaxis: SCD's / Heparin gtt. GI prophylaxis: N/A. Glucose control: SSI. Mobility: Bedrest. Code Status: Limited code - no intubation but OK with CPR in the event of arrest. Family Communication: Son updated over the phone. Disposition: ICU.  Labs   CBC: Recent Labs  Lab 05/02/19 1953  WBC 20.0*  NEUTROABS 18.4*  HGB 14.1  HCT 44.8  MCV 88.2  PLT 161*   Basic Metabolic Panel: Recent Labs  Lab 05/02/19 1953  NA 140  K 2.7*  CL 101  CO2 27  GLUCOSE 104*  BUN 12  CREATININE 0.89  CALCIUM 8.8*   GFR: CrCl cannot be calculated (Unknown ideal weight.). Recent Labs  Lab 05/02/19 1953 05/02/19 2140  WBC 20.0*  --   LATICACIDVEN 3.1* 3.1*   Liver Function Tests: Recent Labs  Lab 05/02/19 1953  AST 29  ALT 17  ALKPHOS 73  BILITOT 0.8  PROT 7.5  ALBUMIN 3.7   No results for input(s): LIPASE, AMYLASE in the last 168 hours. No results for input(s): AMMONIA in the last 168 hours. ABG    Component Value Date/Time   PHART 7.326 (L) 06/27/2018 1018   PCO2ART 57.8 (H) 06/27/2018 1018   PO2ART 54.0 (L) 06/27/2018 1018   HCO3 30.2 (H) 06/27/2018 1018   TCO2 32 06/27/2018 1018   ACIDBASEDEF 7.0 (H) 07/14/2012 0524   O2SAT 84.0 06/27/2018 1018    Coagulation Profile: Recent Labs  Lab 05/02/19 1953  INR  1.3*   Cardiac Enzymes: No results for input(s): CKTOTAL, CKMB, CKMBINDEX, TROPONINI in the last 168 hours. HbA1C: Hemoglobin A1C  Date/Time Value Ref Range Status  04/09/2019 01:48 PM 5.5 4.0 - 5.6 % Final  07/11/2018 04:18 PM 6.1 (A) 4.0 - 5.6 % Final   Hgb A1c MFr Bld  Date/Time Value Ref Range Status  10/14/2010 10:41 AM 13.5 (H) <5.7 % Final    Comment:                                                                           According to the ADA Clinical Practice Recommendations for 2011, when HbA1c is used as a screening test:     >=6.5%  Diagnostic of Diabetes Mellitus            (if abnormal result is confirmed)   5.7-6.4%   Increased risk of developing Diabetes Mellitus   References:Diagnosis and Classification of Diabetes Mellitus,Diabetes SJGG,8366,29(UTMLY 1):S62-S69 and Standards of Medical Care in         Diabetes - 2011,Diabetes Care,2011,34 (Suppl 1):S11-S61.   CBG: No results for input(s): GLUCAP in the last 168 hours.  Review of Systems:   All negative; except for those that are bolded, which indicate positives.  Constitutional: weight loss, weight gain, night sweats, fevers, chills, fatigue, weakness.  HEENT: headaches, sore throat, sneezing, nasal congestion, post nasal drip, difficulty swallowing, tooth/dental problems, visual complaints, visual changes, ear aches. Neuro: difficulty with speech, weakness, numbness, ataxia. CV:  chest pain, orthopnea, PND, swelling in lower extremities, dizziness, palpitations, syncope.  Resp: cough, hemoptysis, dyspnea, wheezing. GI: heartburn, indigestion, abdominal pain, nausea, vomiting, diarrhea, constipation, change in bowel habits, loss of appetite, hematemesis, melena, hematochezia.  GU: dysuria, change in color of urine, urgency or frequency, flank pain, hematuria. MSK: joint pain or swelling, decreased range of motion. Psych: change in mood or affect, depression, anxiety, suicidal ideations, homicidal  ideations. Skin: rash, itching, bruising.   Past medical history  She,  has a past medical history of Anemia, iron deficiency, CHF (congestive heart failure) ([place]), Chronic cough, Cognitive impairment, COPD (chronic obstructive pulmonary disease) ([place]), Cor pulmonale ([place]), Depression, Diabetes mellitus, Diastolic heart failure ([place]), GERD (gastroesophageal reflux disease), H/O mental retardation, Herpes, Hyperlipidemia, Hypertension, OSA (obstructive sleep apnea), Pulmonary hypertension ([place]), Renal disorder, Restrictive lung disease, Shortness of breath, and Venous stasis ulcer ([place]).   Surgical History    Past Surgical History:  Procedure Laterality Date  . CARDIAC CATHETERIZATION N/A 01/13/2016   Procedure: Right Heart Cath;  Surgeon: Jolaine Artist, MD;  Location: [facility];  Service: Cardiovascular;  Laterality: N/A;  . CHOLECYSTECTOMY    . LEFT AND RIGHT HEART CATHETERIZATION WITH CORONARY ANGIOGRAM N/A 02/28/2014   Procedure: LEFT AND RIGHT HEART CATHETERIZATION WITH CORONARY ANGIOGRAM;  Surgeon: Jolaine Artist, MD;  Location[facility] LAB;  Service: Cardiovascular;  Laterality: N/A;  . RIGHT HEART CATH N/A 06/27/2018   Procedure: RIGHT HEART CATH;  Surgeon: Jolaine Artist, MD;  Location: [facility];  Service: Cardiovascular;  Laterality: N/A;     Social History   reports that she has never smoked. She has never used smokeless tobacco. She reports that she does not drink alcohol or use drugs.   Family history   Her family history includes Bipolar disorder in her sister; Breast cancer in her maternal aunt; Hyperlipidemia in her mother; Kidney Stones in her son; Mental illness in her sister; Mental retardation in her brother.   Allergies Allergies  Allergen Reactions  . Aspirin     REACTION: airway swelling  . Codeine     REACTION: tingling in lips and hard breathing - had reaction at dentist - states "I can't take certain kinds of codeine" - happened  maybe 10 yr ago  . Lisinopril     Cough  . Sulfonamide Derivatives     REACTION: airway swelling  . Latex Rash     Home meds  Prior to Admission medications   Medication Sig Start Date End Date Taking? Authorizing Provider  cyanocobalamin 500 MCG tablet Take 500 mcg by mouth daily.   Yes [provider]  ferrous sulfate 325 (65 FE) MG tablet Take 1 tablet (325 mg total)  by mouth daily with breakfast. 07/23/14  Yes Corky Sox, MD  gabapentin (NEURONTIN) 300 MG capsule TAKE 2 CAPSULES BY MOUTH THREE TIMES DAILY Patient taking differently: Take 1,200 mg by mouth in the morning and at bedtime.  02/12/19  Yes [name], MD  metFORMIN (GLUCOPHAGE) 500 MG tablet TAKE 1/2 (ONE-HALF) TABLET BY MOUTH WITH BREAKFAST Patient taking differently: Take 250 mg by mouth daily.  10/25/18  Yes [name], MD  pantoprazole (PROTONIX) 20 MG tablet Take 1 tablet (20 mg total) by mouth 2 (two) times daily. 02/28/19  Yes [name], MD  treprostinil (REMODULIN) 5 MG/ML SOLN injection Inject 0.667 ng/kg/min into the skin continuous.    Yes [provider]  ACCU-CHEK AVIVA PLUS test strip USE 1 STRIP TO CHECK GLUCOSE ONCE DAILY 05/25/18   [name], MD  acetaminophen (TYLENOL) 500 MG tablet Take 1 tablet (500 mg total) by mouth every 6 (six) hours as needed. Patient not taking: Reported on 05/02/2019 08/21/15   [name], MD  cetirizine (ZYRTEC) 10 MG tablet Take 1 tablet by mouth once daily Patient not taking: Reported on 05/02/2019 01/29/19   [name], MD  cyclobenzaprine (FLEXERIL) 5 MG tablet Take 1 tablet (5 mg total) by mouth daily as needed for muscle spasms. Patient not taking: Reported on 05/02/2019 06/07/18   Neva Seat, MD  diclofenac sodium (VOLTAREN) 1 % GEL Apply 2 g topically 4 (four) times daily. Patient not taking: Reported on 05/02/2019 06/07/18   Neva Seat, MD  fluconazole (DIFLUCAN) 150 MG tablet Take 1 tablet (150 mg  total) by mouth every 3 (three) days. Patient not taking: Reported on 05/02/2019 04/11/19   [name], MD  fluticasone [facility]) 50 MCG/ACT nasal spray Place 1 spray into both nostrils daily. Patient not taking: Reported on 05/02/2019 01/05/18 01/05/19  Mosetta Anis, MD  mupirocin ointment (BACTROBAN) 2 % Place 1 application into the nose 2 (two) times daily. Patient not taking: Reported on 06/15/2018 02/25/17   Ledell Noss, MD  nystatin cream (MYCOSTATIN) Apply 1 application topically 2 (two) times daily. Patient not taking: Reported on 06/15/2018 08/20/16   Constant, Peggy, MD  potassium chloride SA (KLOR-CON) 20 MEQ tablet Take 2 tablets by mouth twice daily Patient not taking: Reported on 05/02/2019 03/13/19   [name], MD  pravastatin (PRAVACHOL) 20 MG tablet Take 1 tablet by mouth once daily Patient not taking: Reported on 05/02/2019 08/08/18   [name], MD  sildenafil (REVATIO) 20 MG tablet Take 1 tablet (20 mg total) by mouth 3 (three) times daily. Patient not taking: Reported on 05/02/2019 01/17/19   Bensimhon, [name], MD  sodium chloride (OCEAN) 0.65 % SOLN nasal spray Place 2 sprays into both nostrils as needed for congestion. Patient not taking: Reported on 05/02/2019 02/12/16   [name], MD  spironolactone (ALDACTONE) 25 MG tablet Take 1/2 (one-half) tablet by mouth once daily Patient not taking: Reported on 05/02/2019 10/31/18   Bartholomew Crews, MD  torsemide (DEMADEX) 20 MG tablet Take 4 tablets (80 mg total) by mouth 2 (two) times daily. Patient not taking: Reported on 05/02/2019 07/03/18 10/31/18  Jean Rosenthal, MD  XARELTO 20 MG TABS tablet TAKE 1 TABLET BY MOUTH ONCE DAILY WITH SUPPER Patient not taking: Reported on 05/02/2019 03/07/19   [name], MD  cetirizine (ZYRTEC) 10 MG tablet Take 1 tablet by mouth once daily 07/24/18   [name], MD    Critical  care time: 45 min.    Montey Hora, [facility] 05/02/2019, 11:39 PM

## 2019-05-02 NOTE — ED Notes (Signed)
Pt becoming hypotensive, Dr. Laverta Baltimore made aware.

## 2019-05-02 NOTE — Progress Notes (Signed)
Pharmacy communication note:  Pharmacy asked to help dose patient's home IV treprostinil. Discussed with [facility] rep who confirmed the following treprostinil regimen:  5 mg/ml mullti dose vial add 7 ml remodulin + 93 ml diluent = 100 ml  concentration 350,000 ng/ml  rate: 44 ml/24 hrs  dosing weight: 120 kg Current dose: 81 ng/kg/min  Goal dose: 89 ng/kg/min    Albertina Parr, PharmD., BCPS, BCCCP Clinical Pharmacist Clinical phone for 05/02/19 until 11pm: [phone] If after 11pm, please refer to [facility] for unit-specific pharmacist

## 2019-05-02 NOTE — ED Notes (Signed)
Dr. Laverta Baltimore at bedside, manual BP obtain, 88/50. New orders to be placed.

## 2019-05-02 NOTE — ED Notes (Signed)
Dr. Laverta Baltimore notified of pt elevated troponin.

## 2019-05-02 NOTE — ED Triage Notes (Signed)
Per EMS, pt from home c/o diarrhea and weakness.  In triage, pt is tachy, febrile (103), diaphoretic and weak.  Pt reports diarrhea X years but started having chills and weakness today.    122/76 112 HR 90% on 6L (ususally on 4L)

## 2019-05-02 NOTE — ED Notes (Signed)
Dr. Laverta Baltimore notified of pt critical potassium and lactic results.

## 2019-05-02 NOTE — ED Provider Notes (Signed)
Emergency Department Provider Note   I have reviewed the triage vital signs and the nursing notes.   HISTORY  Chief Complaint Fever, Tachycardia, and Diarrhea   HPI [name] is a 59 y.o. female with PMH of COPD, Cor Pulmonale on continuous Remodulin, DM, HTN, and OSA presents to the emergency department with fever, diarrhea, shortness of breath.  Patient reports chronic diarrhea but it is worsened significantly today.  She states she could not get it to stop and so called EMS.  She did not notice fever at home but this was discovered by EMS.  She is not experiencing chest pain or heart palpitations.  She has felt increasing shortness of breath.  Her Remodulin pump is off in the infusion bag appears empty.  She states she has not taken her medications today.  She is on 4 L nasal cannula at baseline and followed by the pulmonary hypertension team at [facility] as well as the heart failure service here at [facility].  Her primary care doctor is Dr. Shan Levans with IM teaching service. She denies any recent abx. No radiation of symptoms or modifying factors.   Past Medical History:  Diagnosis Date  . Anemia, iron deficiency    secondary to menhorrhagia, on oral iron, also b12 def, getting monthly b12 shots  . CHF (congestive heart failure) ([place])   . Chronic cough    secondary to alleriges and post nasal drip  . Cognitive impairment   . COPD (chronic obstructive pulmonary disease) ([place])   . Cor pulmonale (HCC)    PA Peak pressure 92mHg  . Depression   . Diabetes mellitus    well controlled on metformin  . Diastolic heart failure ([place]   . GERD (gastroesophageal reflux disease)   . H/O mental retardation   . Herpes   . Hyperlipidemia   . Hypertension   . OSA (obstructive sleep apnea)    CPAP  . Pulmonary hypertension ([place]   . Renal disorder   . Restrictive lung disease    PFTs 06/2012 (FVC 54% predicted and FEV1 68% predicted w minimal bronchodilator response).  . Shortness of  breath   . Venous stasis ulcer ([place]    chornic, ?followed up at wound care center, multiple courses of antibiotics in past for cellulitis, on lasix    Patient Active Problem List   Diagnosis Date Noted  . Bleeding hemorrhoid 04/09/2019  . Vaginal itching 04/09/2019  . Urinary incontinence 12/12/2018  . Atelectasis   . Acute on chronic respiratory failure ([place] 06/15/2018  . CAP (community acquired pneumonia) 06/15/2018  . Abdominal muscle strain, initial encounter 06/07/2018  . Hematoma of arm, left, subsequent encounter 10/31/2017  . Ovarian cyst 05/19/2017  . Lower extremity edema 02/25/2017  . Postmenopausal vaginal bleeding 03/21/2015  . Rash 10/31/2014  . Peripheral neuropathy ([place] 09/27/2014  . Healthcare maintenance 09/27/2014  . Acute on chronic diastolic (congestive) heart failure ([place] 06/25/2013  . Atrial flutter ([place] 08/17/2012  . Pulmonary hypertension ([place] 08/16/2012  . Right heart failure, NYHA class 3 ([place] 03/15/2012  . Allergic rhinitis 11/16/2011  . Diabetes ([place] 10/15/2010  . Hyperlipidemia 10/15/2010  . Obesity 10/14/2010  . Obstructive sleep apnea 02/21/2009  . GERD 11/29/2005  . ANEMIA, B12 DEFICIENCY 10/19/2005    Past Surgical History:  Procedure Laterality Date  . CARDIAC CATHETERIZATION N/A 01/13/2016   Procedure: Right Heart Cath;  Surgeon: DJolaine Artist MD;  [facility];  Service: Cardiovascular;  Laterality: N/A;  .  CHOLECYSTECTOMY    . LEFT AND RIGHT HEART CATHETERIZATION WITH CORONARY ANGIOGRAM N/A 02/28/2014   Procedure: LEFT AND RIGHT HEART CATHETERIZATION WITH CORONARY ANGIOGRAM;  Surgeon: Jolaine Artist, MD;  Location[facility] C[facility] LAB;  Service: Cardiovascular;  Laterality: N/A;  . RIGHT HEART CATH N/A 06/27/2018   Procedure: RIGHT HEART CATH;  Surgeon: Jolaine Artist, MD;  Location: [place] CV LAB;  Service: Cardiovascular;  Laterality: N/A;    Allergies Aspirin, Codeine, Lisinopril, Sulfonamide  derivatives, and Latex  Family History  Problem Relation Age of Onset  . Kidney Stones Son   . Mental illness Sister   . Bipolar disorder Sister   . Mental retardation Brother   . Hyperlipidemia Mother   . Breast cancer Maternal Aunt     Social History Social History   Tobacco Use  . Smoking status: Never Smoker  . Smokeless tobacco: Never Used  Substance Use Topics  . Alcohol use: No    Alcohol/week: 0.0 standard drinks  . Drug use: No    Review of Systems  Constitutional: Positive fever/chills Eyes: No visual changes. ENT: No sore throat. Cardiovascular: Denies chest pain. Respiratory: Positive shortness of breath. Gastrointestinal: No abdominal pain.  No nausea, no vomiting. Positive diarrhea.  No constipation. Genitourinary: Negative for dysuria. Musculoskeletal: Negative for back pain. Skin: Negative for rash. Neurological: Negative for headaches, focal weakness or numbness.  10-point ROS otherwise negative.  ____________________________________________   PHYSICAL EXAM:  VITAL SIGNS: ED Triage Vitals  Enc Vitals Group     BP 05/02/19 1919 139/69     Pulse Rate 05/02/19 1919 (!) 123     Resp 05/02/19 1919 (!) 25     Temp 05/02/19 1919 (!) 103 F (39.4 C)     Temp Source 05/02/19 1919 Oral     SpO2 05/02/19 1919 90 %   Constitutional: Alert and oriented. Patient speaking in 2-3 word sentences with labored breathing on arrival.  Eyes: Conjunctivae are normal. Head: Atraumatic. Nose: No congestion/rhinnorhea. Mouth/Throat: Mucous membranes are moist.  Oropharynx non-erythematous. Neck: No stridor.   Cardiovascular: Tachycardia. Good peripheral circulation. Grossly normal heart sounds.   Respiratory: Increased respiratory effort.  No retractions. Lungs diminished at the bases bilaterally without wheezing.  Gastrointestinal: Soft and nontender. No distention.  Musculoskeletal: Edema in the B/L LEs with venous stasis skin changes. No cellulitis.   Neurologic:  Normal speech and language.  Skin:  Skin is warm, dry and intact. Venous stasis changes in the legs without cellulitis.    ____________________________________________   LABS (all labs ordered are listed, but only abnormal results are displayed)  Labs Reviewed  COMPREHENSIVE METABOLIC PANEL - Abnormal; Notable for the following components:      Result Value   Potassium 2.7 (*)    Glucose, Bld 104 (*)    Calcium 8.8 (*)    All other components within normal limits  LACTIC ACID, PLASMA - Abnormal; Notable for the following components:   Lactic Acid, Venous 3.1 (*)    All other components within normal limits  LACTIC ACID, PLASMA - Abnormal; Notable for the following components:   Lactic Acid, Venous 3.1 (*)    All other components within normal limits  CBC WITH DIFFERENTIAL/PLATELET - Abnormal; Notable for the following components:   WBC 20.0 (*)    RDW 16.2 (*)    Platelets 412 (*)    Neutro Abs 18.4 (*)    Lymphs Abs 0.6 (*)    Abs Immature Granulocytes 0.19 (*)  All other components within normal limits  PROTIME-INR - Abnormal; Notable for the following components:   Prothrombin Time 15.6 (*)    INR 1.3 (*)    All other components within normal limits  TROPONIN I (HIGH SENSITIVITY) - Abnormal; Notable for the following components:   Troponin I (High Sensitivity) 886 (*)    All other components within normal limits  RESPIRATORY PANEL BY RT PCR (FLU A&B, COVID)  CULTURE, BLOOD (ROUTINE X 2)  CULTURE, BLOOD (ROUTINE X 2)  C DIFFICILE QUICK SCREEN W PCR REFLEX  GASTROINTESTINAL PANEL BY PCR, STOOL (REPLACES STOOL CULTURE)  URINALYSIS, ROUTINE W REFLEX MICROSCOPIC  BRAIN NATRIURETIC PEPTIDE  I-STAT BETA HCG BLOOD, ED ([facility], [facility], [facility] ONLY)  TROPONIN I (HIGH SENSITIVITY)   ____________________________________________  EKG   EKG Interpretation  Date/Time:  Wednesday May 02 2019 21:26:40 EDT Ventricular Rate:  124 PR Interval:    QRS Duration: 92 QT  Interval:  406 QTC Calculation: 584 R Axis:   130 Text Interpretation: Sinus tachycardia with irregular rate Consider right atrial enlargement Low voltage, precordial leads Right ventricular hypertrophy Similar to June 2020 tracing Confirmed by Nanda Quinton [phone]) on 05/02/2019 9:30:26 PM       ____________________________________________  RADIOLOGY  DG Chest Portable 1 View  Result Date: 05/02/2019 CLINICAL DATA:  Shortness of breath EXAM: PORTABLE CHEST 1 VIEW COMPARISON:  06/19/2018 FINDINGS: Right-sided central venous catheter tip over the cavoatrial region. Cardiomegaly with central vascular congestion. Enlarged central pulmonary vessels suggesting arterial hypertension. No consolidation or effusion. No pneumothorax. IMPRESSION: 1. Cardiomegaly with central vascular congestion 2. Enlarged central pulmonary vessels suggesting arterial hypertension Electronically Signed   By: Donavan Foil M.D.   On: 05/02/2019 20:51    ____________________________________________   PROCEDURES  Procedure(s) performed:   .Critical Care Performed by: Margette Fast, MD Authorized by: Margette Fast, MD   Critical care provider statement:    Critical care time (minutes):  45   Critical care time was exclusive of:  Separately billable procedures and treating other patients and teaching time   Critical care was necessary to treat or prevent imminent or life-threatening deterioration of the following conditions:  Sepsis   Critical care was time spent personally by me on the following activities:  Discussions with consultants, evaluation of patient's response to treatment, examination of patient, ordering and performing treatments and interventions, ordering and review of laboratory studies, ordering and review of radiographic studies, pulse oximetry, re-evaluation of patient's condition, obtaining history from patient or surrogate, review of old charts, blood draw for specimens and development of  treatment plan with patient or surrogate     ____________________________________________   INITIAL IMPRESSION / ASSESSMENT AND PLAN / ED COURSE  Pertinent labs & imaging results that were available during my care of the patient were reviewed by me and considered in my medical decision making (see chart for details).   Patient presents to the emergency department for evaluation of worsening diarrhea, fever but with associated shortness of breath.  She does have a fever of 103 here with elevated lactate and tachycardia.  Sepsis is a concern but also prone to volume overload. Will given gentle IVF bolus here but no 80m/kg with lactate < 40 and no hypotension. Her chest x-ray is reviewed and shows central vascular congestion.  No pneumonia or clear pulmonary edema on x-ray.  Will start antibiotics along with potassium.  Patient's Remodulin infusion has run out here on arrival.  Unclear exactly when this infusion stopped.  We  have consulted pharmacy to help get this restarted.   10:55 PM  Patient has developed some lower blood pressures. Will give more IVF now with hypotension. She remains awake and alert.  She actually looks improved compared to where she was earlier.  Giving additional IV fluids but with her hypotension will discuss with the ICU.  I had previously discussed the case with internal medicine teaching service but given her significant medical comorbidity and now low blood pressures plan for ICU evaluation/admit.   11:05 PM  Discussed patient's case with [facility], Dr.Sommer to request admission. Patient and family (if present) updated with plan. Care transferred to [facility] service.  I reviewed all nursing notes, vitals, pertinent old records, EKGs, labs, imaging (as available).  11:19 PM  Patient's troponin elevated > 800. No CP. No STEMI on EKG. Suspect end-organ effect of sepsis rather than primary ACS.   ____________________________________________  FINAL CLINICAL IMPRESSION(S) / ED  DIAGNOSES  Final diagnoses:  Acute on chronic respiratory failure with hypoxia (HCC)  Diarrhea of presumed infectious origin  Fever, unspecified fever cause     MEDICATIONS GIVEN DURING THIS VISIT:  Medications  potassium chloride 10 mEq in 100 mL IVPB (0 mEq Intravenous Stopped 05/02/19 2259)  vancomycin (VANCOCIN) 2,500 mg in sodium chloride 0.9 % 500 mL IVPB (2,500 mg Intravenous New Bag/Given 05/02/19 2300)  treprostinil (REMODULIN) 35,000,000 ng in glycine diluent for epoprostenol 100 mL (350,000 ng/mL) infusion (81 ng/kg/min  120 kg (Dosing Weight) Intravenous New Bag/Given 05/02/19 2300)  ceFEPIme (MAXIPIME) 2 g in sodium chloride 0.9 % 100 mL IVPB (has no administration in time range)  vancomycin (VANCOREADY) IVPB 1250 mg/250 mL (has no administration in time range)  acetaminophen (TYLENOL) tablet 1,000 mg (1,000 mg Oral Given 05/02/19 2057)  ceFEPIme (MAXIPIME) 2 g in sodium chloride 0.9 % 100 mL IVPB (0 g Intravenous Stopped 05/02/19 2221)  metroNIDAZOLE (FLAGYL) IVPB 500 mg (0 mg Intravenous Stopped 05/02/19 2300)  sodium chloride 0.9 % bolus 250 mL (0 mLs Intravenous Stopped 05/02/19 2209)  sodium chloride 0.9 % bolus 1,000 mL (1,000 mLs Intravenous New Bag/Given 05/02/19 2300)    Note:  This document was prepared using Dragon voice recognition software and may include unintentional dictation errors.  Nanda Quinton, MD, [facility] Emergency Medicine    Younes Degeorge, [facility], MD 05/05/19 [phone]

## 2019-05-02 NOTE — Progress Notes (Signed)
Pharmacy Antibiotic Note  [name] is a 59 y.o. female admitted on 05/02/2019 with fever from unknown source.  Pharmacy has been consulted for cefepime and vancomycin dosing.  WBC elevated at 20, LA 3.1, SCr wnl  Plan: -Cefepime 2 gm IV Q 8 hours -Vancomycin 2.5 gm IV load followed by Vancomycin 1250 mg IV Q 12 hrs. Goal AUC 400-550. Expected AUC: 459 SCr used: 0.89 -Manage CBC, renal fx, cultures and clinical progress -Vanc levels as indicated       Temp (24hrs), Avg:103 F (39.4 C), Min:103 F (39.4 C), Max:103 F (39.4 C)  Recent Labs  Lab 05/02/19 1953  WBC 20.0*  CREATININE 0.89  LATICACIDVEN 3.1*    CrCl cannot be calculated (Unknown ideal weight.).    Allergies  Allergen Reactions  . Aspirin     REACTION: airway swelling  . Codeine     REACTION: tingling in lips and hard breathing - had reaction at dentist - states "I can't take certain kinds of codeine" - happened maybe 10 yr ago  . Lisinopril     Cough  . Sulfonamide Derivatives     REACTION: airway swelling  . Latex Rash    Antimicrobials this admission: Vancomycin 4/28 >>  Cefepime 4/28 >>     Thank you for allowing pharmacy to be a part of this patient's care.  Albertina Parr, PharmD., BCPS, BCCCP Clinical Pharmacist Clinical phone for 05/02/19 until 11pm: [phone] If after 11pm, please refer to [facility] for unit-specific pharmacist

## 2019-05-03 ENCOUNTER — Inpatient Hospital Stay (HOSPITAL_COMMUNITY): Payer: Medicaid Other

## 2019-05-03 DIAGNOSIS — J9621 Acute and chronic respiratory failure with hypoxia: Secondary | ICD-10-CM

## 2019-05-03 DIAGNOSIS — I2721 Secondary pulmonary arterial hypertension: Secondary | ICD-10-CM

## 2019-05-03 DIAGNOSIS — A419 Sepsis, unspecified organism: Secondary | ICD-10-CM | POA: Diagnosis present

## 2019-05-03 DIAGNOSIS — R6520 Severe sepsis without septic shock: Secondary | ICD-10-CM

## 2019-05-03 DIAGNOSIS — J189 Pneumonia, unspecified organism: Secondary | ICD-10-CM

## 2019-05-03 DIAGNOSIS — R509 Fever, unspecified: Secondary | ICD-10-CM

## 2019-05-03 DIAGNOSIS — R778 Other specified abnormalities of plasma proteins: Secondary | ICD-10-CM | POA: Diagnosis not present

## 2019-05-03 DIAGNOSIS — K766 Portal hypertension: Secondary | ICD-10-CM

## 2019-05-03 LAB — CBC
HCT: 45.6 % (ref 36.0–46.0)
Hemoglobin: 14.3 g/dL (ref 12.0–15.0)
MCH: 27.6 pg (ref 26.0–34.0)
MCHC: 31.4 g/dL (ref 30.0–36.0)
MCV: 87.9 fL (ref 80.0–100.0)
Platelets: 435 10*3/uL — ABNORMAL HIGH (ref 150–400)
RBC: 5.19 MIL/uL — ABNORMAL HIGH (ref 3.87–5.11)
RDW: 16.5 % — ABNORMAL HIGH (ref 11.5–15.5)
WBC: 33 10*3/uL — ABNORMAL HIGH (ref 4.0–10.5)
nRBC: 0.1 % (ref 0.0–0.2)

## 2019-05-03 LAB — POCT I-STAT 7, (LYTES, BLD GAS, ICA,H+H)
Acid-Base Excess: 2 mmol/L (ref 0.0–2.0)
Bicarbonate: 28.9 mmol/L — ABNORMAL HIGH (ref 20.0–28.0)
Calcium, Ion: 1.15 mmol/L (ref 1.15–1.40)
HCT: 46 % (ref 36.0–46.0)
Hemoglobin: 15.6 g/dL — ABNORMAL HIGH (ref 12.0–15.0)
O2 Saturation: 88 %
Patient temperature: 103
Potassium: 2.7 mmol/L — CL (ref 3.5–5.1)
Sodium: 142 mmol/L (ref 135–145)
TCO2: 30 mmol/L (ref 22–32)
pCO2 arterial: 58.7 mmHg — ABNORMAL HIGH (ref 32.0–48.0)
pH, Arterial: 7.312 — ABNORMAL LOW (ref 7.350–7.450)
pO2, Arterial: 69 mmHg — ABNORMAL LOW (ref 83.0–108.0)

## 2019-05-03 LAB — BLOOD CULTURE ID PANEL (REFLEXED)

## 2019-05-03 LAB — GLUCOSE, CAPILLARY
Glucose-Capillary: 114 mg/dL — ABNORMAL HIGH (ref 70–99)
Glucose-Capillary: 116 mg/dL — ABNORMAL HIGH (ref 70–99)
Glucose-Capillary: 120 mg/dL — ABNORMAL HIGH (ref 70–99)
Glucose-Capillary: 125 mg/dL — ABNORMAL HIGH (ref 70–99)
Glucose-Capillary: 130 mg/dL — ABNORMAL HIGH (ref 70–99)
Glucose-Capillary: 160 mg/dL — ABNORMAL HIGH (ref 70–99)

## 2019-05-03 LAB — BASIC METABOLIC PANEL
Anion gap: 11 (ref 5–15)
BUN: 18 mg/dL (ref 6–20)
CO2: 25 mmol/L (ref 22–32)
Calcium: 7.8 mg/dL — ABNORMAL LOW (ref 8.9–10.3)
Chloride: 103 mmol/L (ref 98–111)
Creatinine, Ser: 1.35 mg/dL — ABNORMAL HIGH (ref 0.44–1.00)
GFR calc Af Amer: 50 mL/min — ABNORMAL LOW (ref >60)
GFR calc non Af Amer: 43 mL/min — ABNORMAL LOW (ref >60)
Glucose, Bld: 132 mg/dL — ABNORMAL HIGH (ref 70–99)
Potassium: 5 mmol/L (ref 3.5–5.1)
Sodium: 139 mmol/L (ref 135–145)

## 2019-05-03 LAB — MAGNESIUM: Magnesium: 1.5 mg/dL — ABNORMAL LOW (ref 1.7–2.4)

## 2019-05-03 LAB — RESPIRATORY PANEL BY PCR

## 2019-05-03 LAB — PHOSPHORUS: Phosphorus: 3.8 mg/dL (ref 2.5–4.6)

## 2019-05-03 LAB — C DIFFICILE QUICK SCREEN W PCR REFLEX
C Diff antigen: NEGATIVE
C Diff interpretation: NOT DETECTED
C Diff toxin: NEGATIVE

## 2019-05-03 LAB — ECHOCARDIOGRAM COMPLETE: Weight: 4740.77 oz

## 2019-05-03 LAB — TROPONIN I (HIGH SENSITIVITY)
Troponin I (High Sensitivity): 2856 ng/L (ref <18)
Troponin I (High Sensitivity): 3208 ng/L (ref <18)

## 2019-05-03 LAB — BRAIN NATRIURETIC PEPTIDE: B Natriuretic Peptide: 441.1 pg/mL — ABNORMAL HIGH (ref 0.0–100.0)

## 2019-05-03 LAB — MRSA PCR SCREENING: MRSA by PCR: NEGATIVE

## 2019-05-03 LAB — LACTIC ACID, PLASMA: Lactic Acid, Venous: 2.7 mmol/L (ref 0.5–1.9)

## 2019-05-03 MED ORDER — GABAPENTIN 300 MG PO CAPS
600.0000 mg | ORAL_CAPSULE | Freq: Two times a day (BID) | ORAL | Status: DC
  Administered 2019-05-03 – 2019-05-24 (×37): 600 mg via ORAL
  Filled 2019-05-03 (×40): qty 2

## 2019-05-03 MED ORDER — PANTOPRAZOLE SODIUM 40 MG PO TBEC: 40.0000 mg | DELAYED_RELEASE_TABLET | Freq: Every day | ORAL | Status: DC

## 2019-05-03 MED ORDER — NOREPINEPHRINE 16 MG/250ML-% IV SOLN
0.0000 ug/min | INTRAVENOUS | Status: DC
  Administered 2019-05-03: 5 ug/min via INTRAVENOUS
  Administered 2019-05-04: 16 ug/min via INTRAVENOUS
  Administered 2019-05-05: 11:00:00 26 ug/min via INTRAVENOUS
  Filled 2019-05-03 (×3): qty 250

## 2019-05-03 MED ORDER — VANCOMYCIN VARIABLE DOSE PER UNSTABLE RENAL FUNCTION (PHARMACIST DOSING): Status: DC

## 2019-05-03 MED ORDER — VITAMIN B-12 1000 MCG PO TABS
500.0000 ug | ORAL_TABLET | Freq: Every day | ORAL | Status: DC
  Administered 2019-05-04 – 2019-05-24 (×19): 500 ug via ORAL
  Filled 2019-05-03 (×22): qty 1

## 2019-05-03 MED ORDER — PANTOPRAZOLE SODIUM 40 MG IV SOLR
40.0000 mg | Freq: Every day | INTRAVENOUS | Status: DC
  Administered 2019-05-03 – 2019-05-12 (×10): 40 mg via INTRAVENOUS
  Filled 2019-05-03 (×10): qty 40

## 2019-05-03 MED ORDER — PHENYLEPHRINE HCL-NACL 10-0.9 MG/250ML-% IV SOLN
25.0000 ug/min | INTRAVENOUS | Status: DC
  Administered 2019-05-03: 25 ug/min via INTRAVENOUS
  Administered 2019-05-03 (×3): 100 ug/min via INTRAVENOUS
  Filled 2019-05-03 (×2): qty 250
  Filled 2019-05-03: qty 500
  Filled 2019-05-03: qty 250

## 2019-05-03 MED ORDER — CHLORHEXIDINE GLUCONATE CLOTH 2 % EX PADS
6.0000 | MEDICATED_PAD | Freq: Every day | CUTANEOUS | Status: DC
  Administered 2019-05-03 – 2019-05-24 (×22): 6 via TOPICAL

## 2019-05-03 MED ORDER — FERROUS SULFATE 325 (65 FE) MG PO TABS
325.0000 mg | ORAL_TABLET | Freq: Every day | ORAL | Status: DC
  Administered 2019-05-04 – 2019-05-24 (×19): 325 mg via ORAL
  Filled 2019-05-03 (×25): qty 1

## 2019-05-03 MED ORDER — ENOXAPARIN SODIUM 120 MG/0.8ML ~~LOC~~ SOLN
120.0000 mg | Freq: Two times a day (BID) | SUBCUTANEOUS | Status: DC
  Administered 2019-05-03: 120 mg via SUBCUTANEOUS
  Filled 2019-05-03 (×3): qty 0.8

## 2019-05-03 MED ORDER — PRAVASTATIN SODIUM 10 MG PO TABS
20.0000 mg | ORAL_TABLET | Freq: Every day | ORAL | Status: DC
  Administered 2019-05-04 – 2019-05-24 (×19): 20 mg via ORAL
  Filled 2019-05-03 (×22): qty 2

## 2019-05-03 MED ORDER — SODIUM CHLORIDE 0.9 % IV SOLN
2.0000 g | INTRAVENOUS | Status: DC
  Administered 2019-05-03 – 2019-05-04 (×2): 2 g via INTRAVENOUS
  Filled 2019-05-03 (×4): qty 20

## 2019-05-03 MED ORDER — PERFLUTREN LIPID MICROSPHERE
1.0000 mL | INTRAVENOUS | Status: AC | PRN
  Administered 2019-05-03: 4 mL via INTRAVENOUS
  Filled 2019-05-03: qty 10

## 2019-05-03 MED ORDER — FUROSEMIDE 10 MG/ML IJ SOLN
40.0000 mg | Freq: Once | INTRAMUSCULAR | Status: AC
  Administered 2019-05-03: 40 mg via INTRAVENOUS
  Filled 2019-05-03: qty 4

## 2019-05-03 MED ORDER — SODIUM CHLORIDE 0.9 % IV SOLN
500.0000 mg | INTRAVENOUS | Status: AC
  Administered 2019-05-03 – 2019-05-05 (×3): 500 mg via INTRAVENOUS
  Filled 2019-05-03 (×5): qty 500

## 2019-05-03 MED ORDER — MAGNESIUM SULFATE 4 GM/100ML IV SOLN
4.0000 g | Freq: Once | INTRAVENOUS | Status: AC
  Administered 2019-05-03: 4 g via INTRAVENOUS
  Filled 2019-05-03: qty 100

## 2019-05-03 MED ORDER — NOREPINEPHRINE 4 MG/250ML-% IV SOLN: 0.0000 ug/min | INTRAVENOUS | Status: DC

## 2019-05-03 MED ORDER — ENOXAPARIN SODIUM 150 MG/ML ~~LOC~~ SOLN
130.0000 mg | Freq: Two times a day (BID) | SUBCUTANEOUS | Status: DC
  Administered 2019-05-03 – 2019-05-05 (×4): 130 mg via SUBCUTANEOUS
  Administered 2019-05-05: 150 mg via SUBCUTANEOUS
  Administered 2019-05-06 – 2019-05-08 (×5): 130 mg via SUBCUTANEOUS
  Filled 2019-05-03 (×12): qty 0.87

## 2019-05-03 MED ORDER — SILDENAFIL CITRATE 20 MG PO TABS
20.0000 mg | ORAL_TABLET | Freq: Three times a day (TID) | ORAL | Status: DC
  Filled 2019-05-03: qty 1

## 2019-05-03 MED ORDER — PHENYLEPHRINE CONCENTRATED 100MG/250ML (0.4 MG/ML) INFUSION SIMPLE
0.0000 ug/min | INTRAVENOUS | Status: DC
  Administered 2019-05-03: 100 ug/min via INTRAVENOUS
  Filled 2019-05-03 (×3): qty 250

## 2019-05-03 MED ORDER — GABAPENTIN 400 MG PO CAPS: 1200.0000 mg | ORAL_CAPSULE | Freq: Two times a day (BID) | ORAL | Status: DC

## 2019-05-03 MED ORDER — LACTATED RINGERS IV BOLUS
500.0000 mL | Freq: Once | INTRAVENOUS | Status: AC
  Administered 2019-05-03: 1000 mL via INTRAVENOUS

## 2019-05-03 MED ORDER — POTASSIUM CHLORIDE 10 MEQ/100ML IV SOLN
10.0000 meq | INTRAVENOUS | Status: AC
  Administered 2019-05-03 (×3): 10 meq via INTRAVENOUS
  Filled 2019-05-03 (×3): qty 100

## 2019-05-03 MED ORDER — AZITHROMYCIN 500 MG PO TABS
500.0000 mg | ORAL_TABLET | Freq: Every day | ORAL | Status: DC
  Filled 2019-05-03: qty 1

## 2019-05-03 MED ORDER — PHENYLEPHRINE HCL-NACL 10-0.9 MG/250ML-% IV SOLN
0.0000 ug/min | INTRAVENOUS | Status: AC
  Administered 2019-05-03: 90 ug/min via INTRAVENOUS
  Filled 2019-05-03: qty 250

## 2019-05-03 MED ORDER — SODIUM CHLORIDE 0.9 % IV SOLN
250.0000 mL | INTRAVENOUS | Status: DC
  Administered 2019-05-03 – 2019-05-09 (×6): 250 mL via INTRAVENOUS

## 2019-05-03 MED ORDER — ACETAMINOPHEN 10 MG/ML IV SOLN
1000.0000 mg | Freq: Four times a day (QID) | INTRAVENOUS | Status: AC
  Administered 2019-05-03 – 2019-05-04 (×4): 1000 mg via INTRAVENOUS
  Filled 2019-05-03 (×4): qty 100

## 2019-05-03 NOTE — Progress Notes (Signed)
  Echocardiogram 2D Echocardiogram has been performed.  Cadience Bradfield G Kendarius Vigen 05/03/2019, 11:56 AM

## 2019-05-03 NOTE — Progress Notes (Addendum)
NAME:  [name], MRN:  [id number], DOB:  1960-06-28, LOS: 1 ADMISSION DATE:  05/02/2019, CONSULTATION DATE:  05/02/19 REFERRING MD:  Laverta Baltimore, CHIEF COMPLAINT:  Dypsnea   Brief History   [name] is a 59 y.o. female with a pertinent PMH of PAH on Remodulin and Sildenafil (Mean PAP 54, PCW 12), OSA, morbid obesity, DM2, CKD, who presented with fever and productive cough and was admitted with sepsis 2/2 CAP and possible RLE cellulitis along with AoC hypoxic respiratory failure.   History of present illness   [name] is a 59 y.o. female who has a PMH including but not limited to [place] on treprostinol, CHF, PAF on xarelto, Morbid obesity (see "past medical history" for rest).  She presented to [facility] 4/28 with fever and productive cough with associated diarrhea and generalized weakness.  She states diarrhea has occurred "for a few days" but over the past 1 - 2 days, she also began to develop chills and generalized weakness.  Denies chest pain, N/V, abd pain, myalgias.  In ED, she was found to be hypoxic to the 80's despite her baseline of 4L O2.  She was increased to 8L with improvement in sats to low 90's.  She was also hypotensive to the 70's SBP.  She was started on gentle IVF hydration given her hx of PAH and current respiratory failure.  CXR showed possible retrocardiac infiltrate and she was febrile to 103F. She was also found to have findings c/w RLE cellulitis (has venous stasis change to BLE's; however, RLE warmer compared to LLE).  PCCM called for admission.  Past Medical History  has ANEMIA, B12 DEFICIENCY; Obstructive sleep apnea; GERD; Obesity; Diabetes ([place]); Hyperlipidemia; Allergic rhinitis; Right heart failure, NYHA class 3 ([place]); Pulmonary hypertension ([place]); Atrial flutter ([place]); Acute on chronic diastolic (congestive) heart failure ([place]); Peripheral neuropathy ([place]); Healthcare maintenance; Rash; Postmenopausal vaginal bleeding; Lower extremity edema; Ovarian cyst;  Hematoma of arm, left, subsequent encounter; Abdominal muscle strain, initial encounter; Acute on chronic respiratory failure ([place]); CAP (community acquired pneumonia); Atelectasis; Urinary incontinence; Bleeding hemorrhoid; and Vaginal itching on their problem list.  [facility] Events   4/28 > admit started on HFNC and transitioned to NIPPV 2/2 to desaturation   Consults:  Heart Failure  Procedures:  4/28 Art line pending>  Significant Diagnostic Tests:  4/28 CXR > possible retrocardiac infiltrate.  Micro Data:  Flu 4/28 > neg. COVID 4/28 > neg. Blood 4/28 >  RVP 4/28 >   Antimicrobials:  Vanc 4/28 >  Ceftriaxone 4/28 >  Azithromycin 4/28 >   Interim history/subjective:  Patient is short of breath on NIPPV. She has significant lower extremity edema.   Objective   Blood pressure (!) 83/69, pulse (!) 112, temperature 100.1 F (37.8 C), temperature source Rectal, resp. rate (!) 24, weight 134.4 kg, last menstrual period 02/19/2011, SpO2 91 %.       No intake or output data in the 24 hours ending 05/03/19 0538 Filed Weights   05/03/19 0451  Weight: 134.4 kg    Examination: General: Morbidly obese female, in NAD. Neuro: A&O x 3, no deficits. HEENT: [place]/AT. Sclerae anicteric.  EOMI. Cardiovascular: RRR, no M/R/G.  Lungs: Respirations even and unlabored.  Diminished bilaterally. Abdomen: Morbidly obese.  BS x 4, soft, NT/ND.  Musculoskeletal: No gross deformities, venous stasis changes bilaterally with erythema.  RLE warmer and more erythematous vs LLE. Skin: bilateral lower extremities are edematous with warmth in the right leg  Resolved Hospital Problem  list     Assessment & Plan:  Acute on chronic hypoxic respiratory failure - presumed due to CAP 2/2 to Strep. Continue supplemental O2 as needed to maintain SpO2 > 88%. - Continue CAP coverage with Ceftriaxone and azithromycin  - Assess RVP and follow blood cultures. - Push IS.  Severe underlying PAH -  on treprostinil and sildenafil and followed by Dr. Haroldine Laws and Dr. Gilles Chiquito at [facility].  RHC from 06/27/18 with PA 54, PCW 12, PA sat 59% and 63%. - Continue home treprostinil and sildenafil. - Heart Failure consulted.  Probable RLE cellulitis. - Follow cultures.  Sepsis - 2/2 above. - Gentle IVF hydration but caution to not tip her over the edge from a respiratory standpoint. - Arterial line placed - Continue antibiotics   Elevated troponin - unclear etiology thus far. Hx AoC dCHF with cor pulmonale, PAF. - Heparin in lieu of home xarelto. - Trend troponin. - Assess BNP. - Hold diuresis given soft BP's (on home torsemide, spironolactone). - Echo pending   Severe hypokalemia - presumed 2/2 diarrhea.  S/p 3 runs K in ED. - Additional 40 mEq K PO. - Follow BMP.  Hx DM. - SSI. - Hold home metformin.  Best practice:  Diet: NPO. Pain/Anxiety/Delirium protocol (if indicated): N/A. VAP protocol (if indicated): N/A. DVT prophylaxis: SCD's / Heparin gtt. GI prophylaxis: N/A. Glucose control: SSI. Mobility: Bedrest. Code Status: Limited code - no intubation but OK with CPR in the event of arrest. Family Communication: Son updated over the phone. Disposition: ICU.  Labs   CBC: Recent Labs  Lab 05/02/19 1953 05/03/19 0106 05/03/19 0450  WBC 20.0*  --  33.0*  NEUTROABS 18.4*  --   --   HGB 14.1 15.6* 14.3  HCT 44.8 46.0 45.6  MCV 88.2  --  87.9  PLT 412*  --  435*    Basic Metabolic Panel: Recent Labs  Lab 05/02/19 1953 05/03/19 0106  NA 140 142  K 2.7* 2.7*  CL 101  --   CO2 27  --   GLUCOSE 104*  --   BUN 12  --   CREATININE 0.89  --   CALCIUM 8.8*  --    GFR: Estimated Creatinine Clearance: 94.6 mL/min (by C-G formula based on SCr of 0.89 mg/dL). Recent Labs  Lab 05/02/19 1953 05/02/19 2140 05/03/19 0450  WBC 20.0*  --  33.0*  LATICACIDVEN 3.1* 3.1*  --     Liver Function Tests: Recent Labs  Lab 05/02/19 1953  AST 29  ALT 17  ALKPHOS 73    BILITOT 0.8  PROT 7.5  ALBUMIN 3.7   No results for input(s): LIPASE, AMYLASE in the last 168 hours. No results for input(s): AMMONIA in the last 168 hours.  ABG    Component Value Date/Time   PHART 7.312 (L) 05/03/2019 0106   PCO2ART 58.7 (H) 05/03/2019 0106   PO2ART 69 (L) 05/03/2019 0106   HCO3 28.9 (H) 05/03/2019 0106   TCO2 30 05/03/2019 0106   ACIDBASEDEF 7.0 (H) 07/14/2012 0524   O2SAT 88.0 05/03/2019 0106     Coagulation Profile: Recent Labs  Lab 05/02/19 1953  INR 1.3*    Cardiac Enzymes: No results for input(s): CKTOTAL, CKMB, CKMBINDEX, TROPONINI in the last 168 hours.  HbA1C: Hemoglobin A1C  Date/Time Value Ref Range Status  04/09/2019 01:48 PM 5.5 4.0 - 5.6 % Final  07/11/2018 04:18 PM 6.1 (A) 4.0 - 5.6 % Final   Hgb A1c MFr Bld  Date/Time Value Ref  Range Status  10/14/2010 10:41 AM 13.5 (H) <5.7 % Final    Comment:                                                                           According to the ADA Clinical Practice Recommendations for 2011, when HbA1c is used as a screening test:     >=6.5%   Diagnostic of Diabetes Mellitus            (if abnormal result is confirmed)   5.7-6.4%   Increased risk of developing Diabetes Mellitus   References:Diagnosis and Classification of Diabetes Mellitus,Diabetes [id number],8466,59(DJTTS 1):S62-S69 and Standards of Medical Care in         Diabetes - 2011,Diabetes VXBL,3903,00 (Suppl 1):S11-S61.    CBG: Recent Labs  Lab 05/02/19 2343 05/03/19 0449  GLUCAP 118* 130*    Review of Systems:   Constitutional: weight loss, weight gain, night sweats, fevers, chills, fatigue, weakness.  HEENT: headaches, sore throat, sneezing, nasal congestion, post nasal drip, difficulty swallowing, tooth/dental problems, visual complaints, visual changes, ear aches. Neuro: difficulty with speech, weakness, numbness, ataxia. CV:  chest pain, orthopnea, PND, swelling in lower extremities, dizziness, palpitations, syncope.   Resp: cough, hemoptysis, dyspnea, wheezing. GI: heartburn, indigestion, abdominal pain, nausea, vomiting, diarrhea, constipation, change in bowel habits, loss of appetite, hematemesis, melena, hematochezia.  GU: dysuria, change in color of urine, urgency or frequency, flank pain, hematuria. MSK: joint pain or swelling, decreased range of motion. Psych: change in mood or affect, depression, anxiety, suicidal ideations, homicidal ideations. Skin: rash, itching, bruising.  Past Medical History  She,  has a past medical history of Anemia, iron deficiency, CHF (congestive heart failure) ([place]), Chronic cough, Cognitive impairment, COPD (chronic obstructive pulmonary disease) ([place]), Cor pulmonale ([place]), Depression, Diabetes mellitus, Diastolic heart failure ([place]), GERD (gastroesophageal reflux disease), H/O mental retardation, Herpes, Hyperlipidemia, Hypertension, OSA (obstructive sleep apnea), Pulmonary hypertension ([place]), Renal disorder, Restrictive lung disease, Shortness of breath, and Venous stasis ulcer ([place]).   Surgical History    Past Surgical History:  Procedure Laterality Date  . CARDIAC CATHETERIZATION N/A 01/13/2016   Procedure: Right Heart Cath;  Surgeon: Jolaine Artist, MD;  Location: [facility];  Service: Cardiovascular;  Laterality: N/A;  . CHOLECYSTECTOMY    . LEFT AND RIGHT HEART CATHETERIZATION WITH CORONARY ANGIOGRAM N/A 02/28/2014   Procedure: LEFT AND RIGHT HEART CATHETERIZATION WITH CORONARY ANGIOGRAM;  Surgeon: Jolaine Artist, MD;  Location: [facility] CATH LAB;  Service: Cardiovascular;  Laterality: N/A;  . RIGHT HEART CATH N/A 06/27/2018   Procedure: RIGHT HEART CATH;  Surgeon: Jolaine Artist, MD;  Location: [facility];  Service: Cardiovascular;  Laterality: N/A;     Social History   reports that she has never smoked. She has never used smokeless tobacco. She reports that she does not drink alcohol or use drugs.   Family History   Her family history  includes Bipolar disorder in her sister; Breast cancer in her maternal aunt; Hyperlipidemia in her mother; Kidney Stones in her son; Mental illness in her sister; Mental retardation in her brother.   Allergies Allergies  Allergen Reactions  . Aspirin     REACTION: airway swelling  . Codeine  REACTION: tingling in lips and hard breathing - had reaction at dentist - states "I can't take certain kinds of codeine" - happened maybe 10 yr ago  . Lisinopril     Cough  . Sulfonamide Derivatives     REACTION: airway swelling  . Latex Rash     Home Medications  Prior to Admission medications   Medication Sig Start Date End Date Taking? Authorizing Provider  acetaminophen (TYLENOL) 500 MG tablet Take 1 tablet (500 mg total) by mouth every 6 (six) hours as needed. Patient taking differently: Take 500 mg by mouth every 6 (six) hours as needed for mild pain.  08/21/15  Yes [name], MD  cyanocobalamin 500 MCG tablet Take 500 mcg by mouth daily.   Yes [provider]  ferrous sulfate 325 (65 FE) MG tablet Take 1 tablet (325 mg total) by mouth daily with breakfast. 07/23/14  Yes Corky Sox, MD  gabapentin (NEURONTIN) 300 MG capsule TAKE 2 CAPSULES BY MOUTH THREE TIMES DAILY Patient taking differently: Take 1,200 mg by mouth in the morning and at bedtime.  02/12/19  Yes [name], MD  metFORMIN (GLUCOPHAGE) 500 MG tablet TAKE 1/2 (ONE-HALF) TABLET BY MOUTH WITH BREAKFAST Patient taking differently: Take 250 mg by mouth daily.  10/25/18  Yes [name], MD  pantoprazole (PROTONIX) 20 MG tablet Take 1 tablet (20 mg total) by mouth 2 (two) times daily. 02/28/19  Yes [name], MD  potassium chloride SA (KLOR-CON) 20 MEQ tablet Take 2 tablets by mouth twice daily Patient taking differently: Take 40 mEq by mouth 2 (two) times daily.  03/13/19  Yes [name], MD  pravastatin (PRAVACHOL) 20 MG tablet Take 1 tablet by mouth once daily Patient taking  differently: Take 20 mg by mouth daily.  08/08/18  Yes [name], MD  sildenafil (REVATIO) 20 MG tablet Take 1 tablet (20 mg total) by mouth 3 (three) times daily. Patient taking differently: Take 10 mg by mouth daily.  01/17/19  Yes Bensimhon, [name], MD  torsemide (DEMADEX) 20 MG tablet Take 80 mg by mouth 2 (two) times daily.   Yes [provider]  treprostinil (REMODULIN) 5 MG/ML SOLN injection Inject 0.667 ng/kg/min into the skin continuous.    Yes [provider]  XARELTO 20 MG TABS tablet TAKE 1 TABLET BY MOUTH ONCE DAILY WITH SUPPER Patient taking differently: Take 20 mg by mouth daily with supper.  03/07/19  Yes [name], MD  ACCU-CHEK AVIVA PLUS test strip USE 1 STRIP TO CHECK GLUCOSE ONCE DAILY 05/25/18   [name], MD  cetirizine (ZYRTEC) 10 MG tablet Take 1 tablet by mouth once daily Patient not taking: Reported on 05/02/2019 01/29/19   [name], MD  cyclobenzaprine (FLEXERIL) 5 MG tablet Take 1 tablet (5 mg total) by mouth daily as needed for muscle spasms. Patient not taking: Reported on 05/02/2019 06/07/18   Neva Seat, MD  diclofenac sodium (VOLTAREN) 1 % GEL Apply 2 g topically 4 (four) times daily. Patient not taking: Reported on 05/02/2019 06/07/18   Neva Seat, MD  fluconazole (DIFLUCAN) 150 MG tablet Take 1 tablet (150 mg total) by mouth every 3 (three) days. Patient not taking: Reported on 05/02/2019 04/11/19   [name], MD  fluticasone [facility]) 50 MCG/ACT nasal spray Place 1 spray into both nostrils daily. Patient not taking: Reported on 05/02/2019 01/05/18 01/05/19  Mosetta Anis, MD  mupirocin ointment (BACTROBAN) 2 % Place 1  application into the nose 2 (two) times daily. Patient not taking: Reported on 06/15/2018 02/25/17   Ledell Noss, MD  nystatin cream (MYCOSTATIN) Apply 1 application topically 2 (two) times daily. Patient not taking: Reported on 06/15/2018 08/20/16   Constant, Peggy, MD  sodium chloride  (OCEAN) 0.65 % SOLN nasal spray Place 2 sprays into both nostrils as needed for congestion. Patient not taking: Reported on 05/02/2019 02/12/16   [name], MD  spironolactone (ALDACTONE) 25 MG tablet Take 1/2 (one-half) tablet by mouth once daily Patient not taking: Reported on 05/02/2019 10/31/18   Bartholomew Crews, MD  torsemide (DEMADEX) 20 MG tablet Take 4 tablets (80 mg total) by mouth 2 (two) times daily. Patient not taking: Reported on 05/02/2019 07/03/18 05/02/19  Jean Rosenthal, MD  cetirizine (ZYRTEC) 10 MG tablet Take 1 tablet by mouth once daily 07/24/18   [name], MD     Critical care time:     Marianna Payment, D.O. [facility], PGY-1 Pager: [phone], Phone: [phone] Date 05/03/2019 Time 11:31 AM

## 2019-05-03 NOTE — Procedures (Signed)
Central Venous Catheter Insertion Procedure Note VIVION ROMANO [id number] Mar 18, 1960  Procedure: Insertion of Central Venous Catheter Indications: Assessment of intravascular volume, Drug and/or fluid administration and Frequent blood sampling  Procedure Details Consent: Risks of procedure as well as the alternatives and risks of each were explained to the (patient/caregiver).  Consent for procedure obtained. Time Out: Verified patient identification, verified procedure, site/side was marked, verified correct patient position, special equipment/implants available, medications/allergies/relevent history reviewed, required imaging and test results available.  Performed  Maximum sterile technique was used including antiseptics, cap, gloves, gown, hand hygiene, mask and sheet. Skin prep: Chlorhexidine; local anesthetic administered A antimicrobial bonded/coated triple lumen catheter was placed in the left internal jugular vein using the Seldinger technique. Ultrasound guidance used.Yes.   Catheter placed to 20 cm. Blood aspirated via all 3 ports and then flushed x 3. Line sutured x 2 and dressing applied.  Evaluation Blood flow good Complications: No apparent complications Patient did tolerate procedure well. Chest X-ray ordered to verify placement.  CXR: pending.  Ina Homes, MD  [facility] PGY3  Pager: [phone]

## 2019-05-03 NOTE — Consult Note (Addendum)
Advanced Heart Failure Team Consult Note   Primary Physician: [name], MD PCP-Cardiologist:  Dr Haroldine Laws  [facility] PAH: Dr Haynes Kerns [facility], call [phone]   Reason for Consultation: Pulmonary Hypertension   HPI:    [name] is seen today for evaluation of pulmonary hypertension at the request of Dr Jonnie Finner.   [name] is a 59 year old with history of morbid obesity, cognitive impairment, DMII, PAF, OSA, cor pulmonale with severe pulmonary HTN on IV Remodulin  since 2014.   Diagnosed with PAH in 2014-->RA 20, PAP 95/49, PCWP 15, SVR 2067, PVR 16.6 [place], CO 2.9, CI 1.38. Pt was transferred to [facility] for initiation of IV epoprostenol.   In 2015 had myoview EF 78% and no ischemia Followed closely by Dr Gilles Chiquito and [facility] and Dr Haroldine Laws [facility] clinic. Last saw Dr Gilles Chiquito on 3/9/2. 6 MW was decreased. She was asked to go up to 41.   Pulmonary HTN - 2014 Remodulin  - 2015 Sildenafil added  Presented to [facility] with increased dyspnea, chills, diarrhea, and weakness. She has been managing remodulin pump at home for many years. In the ED she was hypoxic and hypotensive. Oxygen was increased from home regimen 4 liters to 8 liters with improved oxygen saturations. CXR with possible LLL pneumonia. Also concern for RLE cellulitis.  Pertinent admission labs included: SARS2 negative, flu negative, WBC 20, lactic acid 3.1, creatinine 0.89, HS Trop 062>3762> 2856.   Started antibiotics. WBC 22>33.  Blood CX 3/3 -->Gram + Cocci -->Streptococcus species   Over night Neo started and increased to 100 mcg. Placed on high flow nasal cannula. Now on CPAP. Remains short of breath   RHC 2020 - On 4 liters RA 7, PA 87/40 (54), PCW 12, Fick CO/CI 5.8/2.7,  PVR 7.1 WU Fick PVR 5.7 WU [facility] ECHO 11/2018 EF 55% Normal LA pressures. Mild RV dysfunction  Advance Echo 11/07/2014 EF 55-60%, RV moderately dilated, and PA peak pressure 78 mm hg   Review of Systems: [y] = yes,  _0  = no   . General: Weight gain _1 ; Weight loss _2 ; Anorexia _3 ; Fatigue [ Y]; Fever _4 ; Chills _5 ; Weakness [Y ]  . Cardiac: Chest pain/pressure _6 ; Resting SOB [Y ]; Exertional SOB [Y ]; Orthopnea _7 ; Pedal Edema [ Y]; Palpitations _8 ; Syncope _9 ; Presyncope _10 ; Paroxysmal nocturnal dyspnea_11   . Pulmonary: Cough _12 ; Wheezing_13 ; Hemoptysis_14 ; Sputum _15 ; Snoring _16   . GI: Vomiting_17 ; Dysphagia_18 ; Melena_19 ; Hematochezia _20 ; Heartburn_21 ; Abdominal pain _22 ; Constipation _23 ; Diarrhea _24 ; BRBPR _25   . GU: Hematuria_26 ; Dysuria _27 ; Nocturia_28   . Vascular: Pain in legs with walking [Y ]; Pain in feet with lying flat _29 ; Non-healing sores _30 ; Stroke _31 ; TIA _32 ; Slurred speech _33 ;  . Neuro: Headaches_34 ; Vertigo_35 ; Seizures_36 ; Paresthesias_37 ;Blurred vision _38 ; Diplopia _39 ; Vision changes _40   . Ortho/Skin: Arthritis _41 ; Joint pain [ Y]; Muscle pain _42 ; Joint swelling _43 ; Back Pain [ Y]; Rash _44   . Psych: Depression_45 ; Anxiety[Y ]  . Heme: Bleeding problems _46 ; Clotting disorders _47 ; Anemia _48   . Endocrine: Diabetes _49 ; Thyroid dysfunction_50   Home Medications Prior to Admission medications   Medication Sig Start Date End Date Taking? Authorizing Provider  acetaminophen (TYLENOL) 500 MG tablet Take 1 tablet (500 mg total) by mouth every 6 (six) hours as needed. Patient taking differently: Take 500 mg by mouth every 6 (six) hours as needed for mild pain.  08/21/15  Yes [name], MD  cyanocobalamin 500 MCG tablet Take 500 mcg by mouth daily.   Yes [provider]  ferrous sulfate 325 (65 FE) MG tablet Take 1 tablet (325 mg total) by mouth daily with breakfast. 07/23/14  Yes Corky Sox, MD  gabapentin (NEURONTIN) 300 MG capsule TAKE 2 CAPSULES BY MOUTH THREE TIMES DAILY Patient taking differently: Take 1,200 mg by mouth in the morning and at bedtime.  02/12/19  Yes [name], MD  metFORMIN (GLUCOPHAGE) 500 MG tablet TAKE 1/2 (ONE-HALF)  TABLET BY MOUTH WITH BREAKFAST Patient taking differently: Take 250 mg by mouth daily.  10/25/18  Yes [name], MD  pantoprazole (PROTONIX) 20 MG tablet Take 1 tablet (20 mg total) by mouth 2 (two) times daily. 02/28/19  Yes [name], MD  potassium chloride SA (KLOR-CON) 20 MEQ tablet Take 2 tablets by mouth twice daily Patient taking differently: Take 40 mEq by mouth 2 (two) times daily.  03/13/19  Yes [name], MD  pravastatin (PRAVACHOL) 20 MG tablet Take 1 tablet by mouth once daily Patient taking differently: Take 20 mg by mouth daily.  08/08/18  Yes [name], MD  sildenafil (REVATIO) 20 MG tablet Take 1 tablet (20 mg total) by mouth 3 (three) times daily. Patient taking differently: Take 10 mg by mouth daily.  01/17/19  Yes Bich Mchaney, [name], MD  torsemide (DEMADEX) 20 MG tablet Take 80 mg by mouth 2 (two) times daily.   Yes [provider]  treprostinil (REMODULIN) 5 MG/ML SOLN injection Inject 0.667 ng/kg/min into the skin continuous.    Yes [provider]  XARELTO 20 MG TABS tablet TAKE 1 TABLET BY MOUTH ONCE DAILY WITH SUPPER Patient taking differently: Take 20 mg by mouth daily with supper.  03/07/19  Yes [name], MD  ACCU-CHEK AVIVA PLUS test strip USE 1 STRIP TO CHECK GLUCOSE ONCE DAILY 05/25/18   [name], MD  cetirizine (ZYRTEC) 10 MG tablet Take 1 tablet by mouth once daily Patient not taking: Reported on 05/02/2019 01/29/19   [name], MD  cyclobenzaprine (FLEXERIL) 5 MG tablet Take 1 tablet (5 mg total) by mouth daily as needed for muscle spasms. Patient not taking: Reported on 05/02/2019 06/07/18   Neva Seat, MD  diclofenac sodium (VOLTAREN) 1 % GEL Apply 2 g topically 4 (four) times daily. Patient not taking: Reported on 05/02/2019 06/07/18   Neva Seat, MD  fluconazole (DIFLUCAN) 150 MG tablet Take 1 tablet (150 mg total) by mouth every 3 (three) days. Patient not taking: Reported  on 05/02/2019 04/11/19   [name], MD  fluticasone [facility]) 50 MCG/ACT nasal spray Place 1 spray into both nostrils daily. Patient not taking: Reported on 05/02/2019 01/05/18 01/05/19  Mosetta Anis, MD  mupirocin ointment (BACTROBAN) 2 % Place 1 application into the nose 2 (two) times daily. Patient not taking: Reported on 06/15/2018 02/25/17   Ledell Noss, MD  nystatin cream (MYCOSTATIN) Apply 1 application topically 2 (two) times daily. Patient not taking: Reported on 06/15/2018 08/20/16   Constant, Peggy, MD  sodium chloride (OCEAN) 0.65 % SOLN nasal spray Place 2 sprays into both nostrils as needed for congestion. Patient not taking: Reported on 05/02/2019 02/12/16  [name], MD  spironolactone (ALDACTONE) 25 MG tablet Take 1/2 (one-half) tablet by mouth once daily Patient not taking: Reported on 05/02/2019 10/31/18   Bartholomew Crews, MD  torsemide (DEMADEX) 20 MG tablet Take 4 tablets (80 mg total) by mouth 2 (two) times daily. Patient not taking: Reported on 05/02/2019 07/03/18 05/02/19  Jean Rosenthal, MD  cetirizine (ZYRTEC) 10 MG tablet Take 1 tablet by mouth once daily 07/24/18   [name], MD    Past Medical History: Past Medical History:  Diagnosis Date  . Anemia, iron deficiency    secondary to menhorrhagia, on oral iron, also b12 def, getting monthly b12 shots  . CHF (congestive heart failure) ([place])   . Chronic cough    secondary to alleriges and post nasal drip  . Cognitive impairment   . COPD (chronic obstructive pulmonary disease) ([place])   . Cor pulmonale (HCC)    PA Peak pressure 27mHg  . Depression   . Diabetes mellitus    well controlled on metformin  . Diastolic heart failure ([place]   . GERD (gastroesophageal reflux disease)   . H/O mental retardation   . Herpes   . Hyperlipidemia   . Hypertension   . OSA (obstructive sleep apnea)    CPAP  . Pulmonary hypertension ([place]   . Renal disorder   . Restrictive lung disease    PFTs 06/2012 (FVC 54%  predicted and FEV1 68% predicted w minimal bronchodilator response).  . Shortness of breath   . Venous stasis ulcer ([place]    chornic, ?followed up at wound care center, multiple courses of antibiotics in past for cellulitis, on lasix    Past Surgical History: Past Surgical History:  Procedure Laterality Date  . CARDIAC CATHETERIZATION N/A 01/13/2016   Procedure: Right Heart Cath;  Surgeon: DJolaine Artist MD;  [facility];  Service: Cardiovascular;  Laterality: N/A;  . CHOLECYSTECTOMY    . LEFT AND RIGHT HEART CATHETERIZATION WITH CORONARY ANGIOGRAM N/A 02/28/2014   Procedure: LEFT AND RIGHT HEART CATHETERIZATION WITH CORONARY ANGIOGRAM;  Surgeon: DJolaine Artist MD;  [facility];  Service: Cardiovascular;  Laterality: N/A;  . RIGHT HEART CATH N/A 06/27/2018   Procedure: RIGHT HEART CATH;  Surgeon: BJolaine Artist MD;  [facility];  Service: Cardiovascular;  Laterality: N/A;    Family History: Family History  Problem Relation Age of Onset  . Kidney Stones Son   . Mental illness Sister   . Bipolar disorder Sister   . Mental retardation Brother   . Hyperlipidemia Mother   . Breast cancer Maternal Aunt     Social History: Social History   Socioeconomic History  . Marital status: Single    Spouse name: Not on file  . Number of children: Not on file  . Years of education: Not on file  . Highest education level: Not on file  Occupational History  . Not on file  Tobacco Use  . Smoking status: Never Smoker  . Smokeless tobacco: Never Used  Substance and Sexual Activity  . Alcohol use: No    Alcohol/week: 0.0 standard drinks  . Drug use: No  . Sexual activity: Never  Other Topics Concern  . Not on file  Social History Narrative  . Not on file   Social Determinants of Health   Financial Resource Strain: Low Risk   . Difficulty of Paying Living Expenses: Not very hard  Food Insecurity: No Food Insecurity  .  Worried  About Charity fundraiser in the Last Year: Never true  . Ran Out of Food in the Last Year: Never true  Transportation Needs: No Transportation Needs  . Lack of Transportation (Medical): No  . Lack of Transportation (Non-Medical): No  Physical Activity: Inactive  . Days of Exercise per Week: 0 days  . Minutes of Exercise per Session: 0 min  Stress: No Stress Concern Present  . Feeling of Stress : Not at all  Social Connections: Moderately Isolated  . Frequency of Communication with Friends and Family: Once a week  . Frequency of Social Gatherings with Friends and Family: Once a week  . Attends Religious Services: More than 4 times per year  . Active Member of Clubs or Organizations: No  . Attends Archivist Meetings: Never  . Marital Status: Never married    Allergies:  Allergies  Allergen Reactions  . Aspirin     REACTION: airway swelling  . Codeine     REACTION: tingling in lips and hard breathing - had reaction at dentist - states "I can't take certain kinds of codeine" - happened maybe 10 yr ago  . Lisinopril     Cough  . Sulfonamide Derivatives     REACTION: airway swelling  . Latex Rash    Objective:    Vital Signs:   Temp:  [100.1 F (37.8 C)-103 F (39.4 C)] 100.8 F (38.2 C) (04/29 0808) Pulse Rate:  [63-125] 121 (04/29 0900) Resp:  [15-39] 29 (04/29 0900) BP: (73-152)/(41-91) 98/70 (04/29 0900) SpO2:  [88 %-95 %] 92 % (04/29 0800) Arterial Line BP: (71-109)/(44-70) 97/46 (04/29 0900) Weight:  [134.4 kg] 134.4 kg (04/29 0451) Last BM Date: 05/03/19  Weight change: Filed Weights   05/03/19 0451  Weight: 134.4 kg    Intake/Output:   Intake/Output Summary (Last 24 hours) at 05/03/2019 1007 Last data filed at 05/03/2019 0900 Gross per 24 hour  Intake 1820.64 ml  Output --  Net 1820.64 ml      Physical Exam    General:  Appears weak. Shallow respirations on CPAP  HEENT: normal Neck: supple. JVP difficult to assess due body habitus  .  Carotids 2+ bilat; no bruits. No lymphadenopathy or thyromegaly appreciated. Cor: PMI nondisplaced. Regular rate & rhythm. No rubs, gallops or murmurs. R hickman catheter  Lungs: shallow decreased Abdomen: soft, nontender, nondistended. No hepatosplenomegaly. No bruits or masses. Good bowel sounds. Extremities: no cyanosis, clubbing, rash, R and LLE 2+ edema with erythema Neuro: alert & orientedx3, cranial nerves grossly intact. moves all 4 extremities w/o difficulty. Affect flat    Telemetry   ST 110-120s   EKG    ST 124 bpm   Labs   Basic Metabolic Panel: Recent Labs  Lab 05/02/19 1953 05/03/19 0106 05/03/19 0450  NA 140 142 139  K 2.7* 2.7* 5.0  CL 101  --  103  CO2 27  --  25  GLUCOSE 104*  --  132*  BUN 12  --  18  CREATININE 0.89  --  1.35*  CALCIUM 8.8*  --  7.8*  MG  --   --  1.5*  PHOS  --   --  3.8    Liver Function Tests: Recent Labs  Lab 05/02/19 1953  AST 29  ALT 17  ALKPHOS 73  BILITOT 0.8  PROT 7.5  ALBUMIN 3.7   No results for input(s): LIPASE, AMYLASE in the last 168 hours. No results for input(s): AMMONIA in the  last 168 hours.  CBC: Recent Labs  Lab 05/02/19 1953 05/03/19 0106 05/03/19 0450  WBC 20.0*  --  33.0*  NEUTROABS 18.4*  --   --   HGB 14.1 15.6* 14.3  HCT 44.8 46.0 45.6  MCV 88.2  --  87.9  PLT 412*  --  435*    Cardiac Enzymes: No results for input(s): CKTOTAL, CKMB, CKMBINDEX, TROPONINI in the last 168 hours.  BNP: BNP (last 3 results) Recent Labs    06/15/18 0744 05/02/19 2350  BNP 49.0 441.1*    ProBNP (last 3 results) No results for input(s): PROBNP in the last 8760 hours.   CBG: Recent Labs  Lab 05/02/19 2343 05/03/19 0449 05/03/19 0747  GLUCAP 118* 130* 125*    Coagulation Studies: Recent Labs    05/02/19 1953  LABPROT 15.6*  INR 1.3*     Imaging   DG Chest Port 1 View  Result Date: 05/03/2019 CLINICAL DATA:  Respiratory failure. EXAM: PORTABLE CHEST 1 VIEW COMPARISON:  05/02/2019  FINDINGS: Stable cardiomegaly. Right-sided central venous catheter remains in appropriate position. Stable enlarged central pulmonary arteries, consistent with pulmonary arterial hypertension both lungs are clear. IMPRESSION: Stable cardiomegaly, and enlarged central pulmonary arteries consistent with pulmonary arterial hypertension. No active lung disease. Electronically Signed   By: Marlaine Hind M.D.   On: 05/03/2019 08:28   DG Chest Portable 1 View  Result Date: 05/02/2019 CLINICAL DATA:  Shortness of breath EXAM: PORTABLE CHEST 1 VIEW COMPARISON:  06/19/2018 FINDINGS: Right-sided central venous catheter tip over the cavoatrial region. Cardiomegaly with central vascular congestion. Enlarged central pulmonary vessels suggesting arterial hypertension. No consolidation or effusion. No pneumothorax. IMPRESSION: 1. Cardiomegaly with central vascular congestion 2. Enlarged central pulmonary vessels suggesting arterial hypertension Electronically Signed   By: Donavan Foil M.D.   On: 05/02/2019 20:51      Medications:     Current Medications: . azithromycin  500 mg Oral Daily  . Chlorhexidine Gluconate Cloth  6 each Topical Daily  . enoxaparin (LOVENOX) injection  120 mg Subcutaneous Q12H  . ferrous sulfate  325 mg Oral Q breakfast  . gabapentin  1,200 mg Oral BID  . insulin aspart  0-20 Units Subcutaneous Q4H  . pantoprazole  40 mg Oral Daily  . pravastatin  20 mg Oral Daily  . sildenafil  20 mg Oral TID  . vancomycin variable dose per unstable renal function (pharmacist dosing)   Does not apply See admin instructions  . vitamin B-12  500 mcg Oral Daily     Infusions: . sodium chloride 20 mL/hr at 05/03/19 0800  . cefTRIAXone (ROCEPHIN)  IV 200 mL/hr at 05/03/19 0900  . phenylephrine (NEO-SYNEPHRINE) Adult infusion 100 mcg/min (05/03/19 0900)  . treprostinil (REMODULIN) infusion 81 ng/kg/min (05/02/19 2300)        Assessment/Plan   1. Shock, septic -Hypotensive. Possible source  lower extremity cellulitis/CXR concerning for CAP. She also has hickman catheter in R upper chest. May need to replace.  -Blood Cultures 3/3 Strep -WBC 20>33 - On azithromycin+ vancomycin - Continue Neo and can Norepi to support pressure.   2. A/C Hypoxic Respiratory Failure - Hypoxic on admit. Chronically on 4 liters but placed on HFNC this morning.  -Currently on CPAP    3. Elevated HS Trop   -Trend 017>7939> 2856. Suspect demand ischemia - -2015 had myoviewIn EF 78% and no ischemia  - Not having chest pain.   4. PAH with Cor Pulmonale: Diagnosed back in 2014.  Mixed PAH WHO  Groups I, II, III PAH far out of proportion to L-sided pressures or restrictive lung disease. - -Hold sildenafil with hypotension -Continue remodulin, dosing per [place]  - Volume status elevated but will treat sepsis. Need to diurese soon.    Length of Stay: 1  Amy Clegg, NP  05/03/2019, 10:07 AM  Advanced Heart Failure Team Pager [phone] (M-F; 7a - 4p)  Please contact [facility] for night-coverage after hours (4p -7a ) and weekends on [email or web address]  Agree with above  89 y/o woman with morbid obesity, PAF, cognitive dysfunction, DM2 and severe PAH on IV remodulin.   Now admitted with sepsis with strep bacteremia likely related to recurrent LE cellulitis.   Currently quite ill on BIPAP with lactate 3.1, WBC 33K, hs trop 3,200 On phenylephrine 100  Denies CP. Very SOB. Says she is thirsty.   General:  Critically-ill appearing. On Bipap.  HEENT: Flushed on BIPAP Neck: supple. JVP hard to see Carotids 2+ bilat; no bruits. No lymphadenopathy or thryomegaly appreciated. Cor: PMI nondisplaced. Tachy regular Lungs: + crackles Abdomen: obese soft, nontender, nondistended. No hepatosplenomegaly. No bruits or masses. Good bowel sounds. Extremities: no cyanosis, clubbing, rash, 3+ edema  Severe erythema R>L LE Neuro: alert & orientedx3, cranial nerves grossly intact. moves all 4  extremities w/o difficulty. Affect pleasant  She is critically ill with strep bacteremia likely due to LE cellulitis on top of advanced PAH. SBP improve on Neo 100. Respiratory status very tenuous in setting of volume overload and PAH  Echo at bedside LVEF 65% RV dilated with moderate dysfunction.   Plan: 1) Continue Neo for BP support. Can add norepinephrine +/- VP as needed 2) Continue IV remodulin for PAH 3) Continue broad spectrum abx 4) When pressures stabilize will need diuresis 5) Troponins are elevated and likely demand ischemia. Continue lovenox for AC. Start ASA 6) With strep bacteremia her Hickman will likely need to be removed and will need TEE. Likely will need temporary central line for remodulin.  7) Continue BIPAP. Hopefully can avoid intubation.   CRITICAL CARE Performed by: Glori Bickers  Total critical care time: 55 minutes  Critical care time was exclusive of separately billable procedures and treating other patients.  Critical care was necessary to treat or prevent imminent or life-threatening deterioration.  Critical care was time spent personally by me (independent of midlevel providers or residents) on the following activities: development of treatment plan with patient and/or surrogate as well as nursing, discussions with consultants, evaluation of patient's response to treatment, examination of patient, obtaining history from patient or surrogate, ordering and performing treatments and interventions, ordering and review of laboratory studies, ordering and review of radiographic studies, pulse oximetry and re-evaluation of patient's condition.  Glori Bickers, MD  1:37 PM

## 2019-05-03 NOTE — ED Notes (Signed)
Pt O2 saturation dropping to 86% on 6L [place]. RT at bedside to place A Line. Critical care paged.

## 2019-05-03 NOTE — ED Notes (Signed)
RT placed pt on heated high flow cannula. RN spoke to Critical care and MD stated he was ok with pt continuing to be on high flow O2.

## 2019-05-03 NOTE — ED Notes (Signed)
Pt A line placed by RT. Pt BP 85/42(58). Critical care paged.

## 2019-05-03 NOTE — Procedures (Signed)
Arterial Catheter Insertion Procedure Note JYL CHICO [id number] 01/04/1961  Procedure: Insertion of Arterial Catheter  Indications: Blood pressure monitoring  Procedure Details Consent: Unable to obtain consent because of emergent medical necessity. Time Out: Verified patient identification, verified procedure, site/side was marked, verified correct patient position, special equipment/implants available, medications/allergies/relevent history reviewed, required imaging and test results available.  Performed  Maximum sterile technique was used including antiseptics, cap, gloves, gown, hand hygiene, mask and sheet. Skin prep: Chlorhexidine; local anesthetic administered 20 gauge catheter was inserted into left radial artery using the Seldinger technique. ULTRASOUND GUIDANCE USED: NO Evaluation Blood flow good; BP tracing good. Complications: No apparent complications   .   Levora Dredge 05/03/2019

## 2019-05-03 NOTE — ED Notes (Signed)
Pharmacy messaged to request phenylephrine dose.

## 2019-05-03 NOTE — Progress Notes (Signed)
PHARMACY - PHYSICIAN COMMUNICATION CRITICAL VALUE ALERT - BLOOD CULTURE IDENTIFICATION (BCID)  [name] is an 58 y.o. female who presented to [facility] on 05/02/2019 with a chief complaint of dyspnea and weakness.  Assessment: 3/3 Bcx positive for gram positive cocci in chains, found to be strep species but not otherwise identified on BCID.  Name of physician (or Provider) Contacted: Dr. Marianna Payment  Current antibiotics: ceftriaxone + vancomycin  Changes to prescribed antibiotics recommended:  Suspected pneumonia and non-purulent cellulitis. Can likely stop vancomycin.  No results found for this or any previous visit.  Sherren Kerns, PharmD PGY1 Acute Care Pharmacy Resident 05/03/2019  10:28 AM

## 2019-05-03 NOTE — Progress Notes (Signed)
Pt arrived from ED on NRB 15, oxygen sat 88%, lungs clear to auscultation. Pt with abdominal use and  increased work of breathing, pt frequently asking "is the oxygen on". RT notified for asst. SBP 80s HR 113  on phenylephrine at 20 mcg/min LPIV, will  titrated drip to maintain map greater than 65. Rt subclavian tunneled catheter with remodulin drip infusing via pt infusion pump from home.

## 2019-05-03 NOTE — ED Notes (Addendum)
RN spoke to Dr. Oletta Darter in regards to pt hypotention, O2 saturation, and ABG results. RN informed his of pt saturation being 86-90% on high flow [place]. New orders to be placed.

## 2019-05-03 NOTE — Progress Notes (Signed)
[facility] Progress Note Patient Name: DIAMON REDDINGER DOB: December 28, 1960 MRN: [id number]   Date of Service  05/03/2019  HPI/Events of Note  Hypotension - BP = 81/53 with MAP = 64.   eICU Interventions  Will order: 1. Phenylephrine IV infusion. Titrate to MAP > 65.         Electra Paladino [place] 05/03/2019, 1:19 AM

## 2019-05-04 ENCOUNTER — Inpatient Hospital Stay (HOSPITAL_COMMUNITY): Payer: Medicaid Other

## 2019-05-04 DIAGNOSIS — A419 Sepsis, unspecified organism: Secondary | ICD-10-CM | POA: Diagnosis not present

## 2019-05-04 DIAGNOSIS — I5033 Acute on chronic diastolic (congestive) heart failure: Secondary | ICD-10-CM

## 2019-05-04 DIAGNOSIS — J9621 Acute and chronic respiratory failure with hypoxia: Secondary | ICD-10-CM | POA: Diagnosis not present

## 2019-05-04 DIAGNOSIS — J189 Pneumonia, unspecified organism: Secondary | ICD-10-CM | POA: Diagnosis not present

## 2019-05-04 DIAGNOSIS — I2721 Secondary pulmonary arterial hypertension: Secondary | ICD-10-CM | POA: Diagnosis not present

## 2019-05-04 LAB — POCT I-STAT 7, (LYTES, BLD GAS, ICA,H+H)
Acid-base deficit: 1 mmol/L (ref 0.0–2.0)
Acid-base deficit: 1 mmol/L (ref 0.0–2.0)
Bicarbonate: 29.1 mmol/L — ABNORMAL HIGH (ref 20.0–28.0)
Bicarbonate: 30.5 mmol/L — ABNORMAL HIGH (ref 20.0–28.0)
Calcium, Ion: 1.17 mmol/L (ref 1.15–1.40)
Calcium, Ion: 1.16 mmol/L (ref 1.15–1.40)
HCT: 46 % (ref 36.0–46.0)
HCT: 46 % (ref 36.0–46.0)
Hemoglobin: 15.6 g/dL — ABNORMAL HIGH (ref 12.0–15.0)
Hemoglobin: 15.6 g/dL — ABNORMAL HIGH (ref 12.0–15.0)
O2 Saturation: 81 %
O2 Saturation: 90 %
Patient temperature: 98.2
Patient temperature: 98
Potassium: 4 mmol/L (ref 3.5–5.1)
Potassium: 4.1 mmol/L (ref 3.5–5.1)
Sodium: 138 mmol/L (ref 135–145)
Sodium: 138 mmol/L (ref 135–145)
TCO2: 31 mmol/L (ref 22–32)
TCO2: 33 mmol/L — ABNORMAL HIGH (ref 22–32)
pCO2 arterial: 71 mmHg (ref 32.0–48.0)
pCO2 arterial: 80.1 mmHg (ref 32.0–48.0)
pH, Arterial: 7.219 — ABNORMAL LOW (ref 7.350–7.450)
pH, Arterial: 7.188 — CL (ref 7.350–7.450)
pO2, Arterial: 56 mmHg — ABNORMAL LOW (ref 83.0–108.0)
pO2, Arterial: 73 mmHg — ABNORMAL LOW (ref 83.0–108.0)

## 2019-05-04 LAB — GLUCOSE, CAPILLARY
Glucose-Capillary: 108 mg/dL — ABNORMAL HIGH (ref 70–99)
Glucose-Capillary: 120 mg/dL — ABNORMAL HIGH (ref 70–99)
Glucose-Capillary: 129 mg/dL — ABNORMAL HIGH (ref 70–99)
Glucose-Capillary: 106 mg/dL — ABNORMAL HIGH (ref 70–99)
Glucose-Capillary: 130 mg/dL — ABNORMAL HIGH (ref 70–99)

## 2019-05-04 LAB — CBC
HCT: 43.4 % (ref 36.0–46.0)
Hemoglobin: 13.7 g/dL (ref 12.0–15.0)
MCH: 28.5 pg (ref 26.0–34.0)
MCHC: 31.6 g/dL (ref 30.0–36.0)
MCV: 90.2 fL (ref 80.0–100.0)
Platelets: 295 10*3/uL (ref 150–400)
RBC: 4.81 MIL/uL (ref 3.87–5.11)
RDW: 16.9 % — ABNORMAL HIGH (ref 11.5–15.5)
WBC: 45.5 10*3/uL — ABNORMAL HIGH (ref 4.0–10.5)
nRBC: 0 % (ref 0.0–0.2)

## 2019-05-04 LAB — BASIC METABOLIC PANEL
Anion gap: 11 (ref 5–15)
BUN: 30 mg/dL — ABNORMAL HIGH (ref 6–20)
CO2: 24 mmol/L (ref 22–32)
Calcium: 7.5 mg/dL — ABNORMAL LOW (ref 8.9–10.3)
Chloride: 103 mmol/L (ref 98–111)
Creatinine, Ser: 1.26 mg/dL — ABNORMAL HIGH (ref 0.44–1.00)
GFR calc Af Amer: 55 mL/min — ABNORMAL LOW (ref >60)
GFR calc non Af Amer: 47 mL/min — ABNORMAL LOW (ref >60)
Glucose, Bld: 148 mg/dL — ABNORMAL HIGH (ref 70–99)
Potassium: 3.3 mmol/L — ABNORMAL LOW (ref 3.5–5.1)
Sodium: 138 mmol/L (ref 135–145)

## 2019-05-04 LAB — GASTROINTESTINAL PANEL BY PCR, STOOL (REPLACES STOOL CULTURE)

## 2019-05-04 LAB — MAGNESIUM: Magnesium: 2.2 mg/dL (ref 1.7–2.4)

## 2019-05-04 MED ORDER — FUROSEMIDE 10 MG/ML IJ SOLN
20.0000 mg | Freq: Once | INTRAMUSCULAR | Status: AC
  Administered 2019-05-04: 11:00:00 20 mg via INTRAVENOUS
  Filled 2019-05-04: qty 2

## 2019-05-04 MED ORDER — ACETAMINOPHEN 10 MG/ML IV SOLN
1000.0000 mg | Freq: Four times a day (QID) | INTRAVENOUS | Status: AC | PRN
  Filled 2019-05-04 (×2): qty 100

## 2019-05-04 MED ORDER — TREPROSTINIL 100 MG/20ML IJ SOLN
81.0000 ng/kg/min | INTRAVENOUS | Status: DC
  Administered 2019-05-05: 06:00:00 81 ng/kg/min via INTRAVENOUS
  Filled 2019-05-04 (×2): qty 7

## 2019-05-04 MED ORDER — PIPERACILLIN-TAZOBACTAM 3.375 G IVPB
3.3750 g | Freq: Three times a day (TID) | INTRAVENOUS | Status: DC
  Administered 2019-05-04 – 2019-05-05 (×2): 3.375 g via INTRAVENOUS
  Filled 2019-05-04 (×3): qty 50

## 2019-05-04 MED ORDER — ONDANSETRON HCL 4 MG/2ML IJ SOLN
4.0000 mg | Freq: Four times a day (QID) | INTRAMUSCULAR | Status: DC
  Administered 2019-05-04 – 2019-05-05 (×3): 4 mg via INTRAVENOUS
  Filled 2019-05-04 (×3): qty 2

## 2019-05-04 MED ORDER — POTASSIUM CHLORIDE 10 MEQ/50ML IV SOLN
10.0000 meq | INTRAVENOUS | Status: AC
  Administered 2019-05-04 (×4): 10 meq via INTRAVENOUS
  Filled 2019-05-04 (×4): qty 50

## 2019-05-04 MED ORDER — PIPERACILLIN-TAZOBACTAM 3.375 G IVPB 30 MIN
3.3750 g | Freq: Once | INTRAVENOUS | Status: AC
  Administered 2019-05-04: 3.375 g via INTRAVENOUS
  Filled 2019-05-04: qty 50

## 2019-05-04 MED ORDER — VANCOMYCIN HCL 1250 MG/250ML IV SOLN
1250.0000 mg | INTRAVENOUS | Status: DC
  Administered 2019-05-04: 1250 mg via INTRAVENOUS
  Filled 2019-05-04: qty 250

## 2019-05-04 NOTE — Progress Notes (Signed)
[facility] Progress Note Patient Name: [name] DOB: 07-16-1960 MRN: [id number]   Date of Service  05/04/2019  HPI/Events of Note  Pt pulled out her central line and needs a replacement because she's on pressors.  eICU Interventions  Bedside PCCM informed.        Frederik Pear 05/04/2019, 9:23 PM

## 2019-05-04 NOTE — Progress Notes (Signed)
Pharmacy Antibiotic Note  [name] is a 59 y.o. female admitted on 05/02/2019 with sepsis.  Pharmacy has been consulted for vancomycin/zosyn dosing. Previously on ceftriaxone/azithromycin. SCr back down a bit 1.26 today (0.89 on admit). Patient was loaded with vancomycin 2544m IV x 1 on 4/28.  Plan: Zosyn 3.375g IV (334m infusion) x1; then 3.375g IV q8h (4h infusion) Vancomycin 1250 mg IV Q 24 hrs. Goal AUC 400-550. Expected AUC: 472 SCr used: 1.26 Azithro 50068mV q24h per MD Monitor clinical progress, c/s, renal function F/u de-escalation plan/LOT, vancomycin levels as indicated   Weight: 135.9 kg (299 lb 9.7 oz)  Temp (24hrs), Avg:99.1 F (37.3 C), Min:97.8 F (36.6 C), Max:101.2 F (38.4 C)  Recent Labs  Lab 05/02/19 1953 05/02/19 2140 05/03/19 0450 05/03/19 1506 05/04/19 1022  WBC 20.0*  --  33.0*  --  45.5*  CREATININE 0.89  --  1.35*  --  1.26*  LATICACIDVEN 3.1* 3.1*  --  2.7*  --     Estimated Creatinine Clearance: 67.3 mL/min (A) (by C-G formula based on SCr of 1.26 mg/dL (H)).    Allergies  Allergen Reactions  . Aspirin     REACTION: airway swelling  . Codeine     REACTION: tingling in lips and hard breathing - had reaction at dentist - states "I can't take certain kinds of codeine" - happened maybe 10 yr ago  . Lisinopril     Cough  . Sulfonamide Derivatives     REACTION: airway swelling  . Latex Rash    HalArturo MortonharmD, BCPS Please check AMION for all [facility] numbers Clinical Pharmacist 05/04/2019 3:08 PM

## 2019-05-04 NOTE — Progress Notes (Signed)
Mickel Baas Gleason, PA at bedside notified about ABG results.

## 2019-05-04 NOTE — Progress Notes (Signed)
Discussed with Levada Dy, [facility] RN and did an extensive research on remodulin. This medication is contraindicated to go through a peripheral line or midline. RN has been made aware. Each port of the central line is it's own access and there is no risk for cross contamination if remodulin is dedicated to one port of the central line.

## 2019-05-04 NOTE — Progress Notes (Signed)
Patient's Remodulin pump malfunctioned and stopped infusion. Pharmacy notified and came and replaced pump. MD paged to be informed, awaiting response. Patient asymptomatic with no s/s of distress noted.

## 2019-05-04 NOTE — Progress Notes (Signed)
NAME:  [name], MRN:  [id number], DOB:  Nov 09, 1960, LOS: 2 ADMISSION DATE:  05/02/2019, CONSULTATION DATE:  05/02/19 REFERRING MD:  Laverta Baltimore, CHIEF COMPLAINT:  Dypsnea   Brief History   [name] is a 59 y.o. female with a pertinent PMH of PAH on Remodulin and Sildenafil (Mean PAP 54, PCW 12), OSA, morbid obesity, DM2, CKD, who presented with fever and productive cough and was admitted with sepsis 2/2 CAP and possible RLE cellulitis along with acute on chronic hypoxic respiratory failure.   History of present illness   [name] is a 59 y.o. female who has a PMH including but not limited to [place] on treprostinol, CHF, PAF on xarelto, Morbid obesity (see "past medical history" for rest).  She presented to [facility] 4/28 with fever and productive cough with associated diarrhea and generalized weakness.  She states diarrhea has occurred "for a few days" but over the past 1 - 2 days, she also began to develop chills and generalized weakness.  Denies chest pain, N/V, abd pain, myalgias.  In ED, she was found to be hypoxic to the 80's despite her baseline of 4L O2.  She was increased to 8L with improvement in sats to low 90's.  She was also hypotensive to the 70's SBP.  She was started on gentle IVF hydration given her hx of PAH and current respiratory failure.  CXR showed possible retrocardiac infiltrate and she was febrile to 103F. She was also found to have findings c/w RLE cellulitis (has venous stasis change to BLE's; however, RLE warmer compared to LLE).  PCCM called for admission.  Past Medical History  has ANEMIA, B12 DEFICIENCY; Obstructive sleep apnea; GERD; Obesity; Diabetes ([place]); Hyperlipidemia; Allergic rhinitis; Right heart failure, NYHA class 3 ([place]); Pulmonary hypertension ([place]); Atrial flutter ([place]); Acute on chronic diastolic (congestive) heart failure ([place]); Peripheral neuropathy ([place]); Healthcare maintenance; Rash; Postmenopausal vaginal bleeding; Lower extremity edema;  Ovarian cyst; Hematoma of arm, left, subsequent encounter; Abdominal muscle strain, initial encounter; Acute on chronic respiratory failure ([place]); CAP (community acquired pneumonia); Atelectasis; Urinary incontinence; Bleeding hemorrhoid; and Vaginal itching on their problem list.  [facility] Events   4/28 > admit started on HFNC and transitioned to NIPPV 2/2 to desaturation   Consults:  Heart Failure  Procedures:  4/28 Art line pending> 4/29: CVC   Significant Diagnostic Tests:  4/28 CXR > possible retrocardiac infiltrate.  Micro Data:  Flu 4/28 > neg. COVID 4/28 > neg. Blood 4/28 >  RVP 4/28 >   Antimicrobials:  Vanc 4/28 > 4/29 Ceftriaxone 4/28 >  Azithromycin 4/28 >   Interim history/subjective:  NAE. Pt had some SOB yesterday on BiPAP +/- worsening LE edema. HF consulted and recommended holding sildenafil d/t soft pressures, removing [place] and doing TEE d/t strep bacteremia. Pt was given 40 mg IV Lasix around 1800, with modest response of daily UOP 1.2 L. VSS, BP 130s/50s, HR 90s, SpO2 95% on BiPAP, has not fevered. Cultures grew strep spp and vancomycin was d/c. Echo showed decreased PA pressure from prior studies likely 2/2 worsening RV function; LVEF 55-60%.   Objective   Blood pressure 99/62, pulse 92, temperature 98.2 F (36.8 C), temperature source Oral, resp. rate (!) 26, weight 135.9 kg, last menstrual period 02/19/2011, SpO2 97 %.        Intake/Output Summary (Last 24 hours) at 05/04/2019 0806 Last data filed at 05/04/2019 0600 Gross per 24 hour  Intake 2328.81 ml  Output 1280 ml  Net 1048.Benson  ml   Filed Weights   05/03/19 0451 05/04/19 0500  Weight: 134.4 kg 135.9 kg    Examination:  Gen: Morbidly obese female, seen in bed, in NAD. A&O x3. Answers questions appropriately, appears generally weak with weak cough HEENT: atraumatic, normocephalic, sclera anicteric, EOMI, PERRLA, MMM  Neck: JVD difficult to assess 2/2 body habitus  Heart: RRR,  S1, S2, no M/R/G, no chest wall tenderness Lungs: Distant breath sounds; normal work of breathing.  Abdomen: Normoactive bowel sounds, soft, NTND, no rebound/guarding Extremities: no clubbing, cyanosis. bilateral lower extremities have 2+ edema to the knees; pulses are +2 in bilateral upper and lower extremities Neuro: CN II-XI grossly intact. No focal deficits. Skin: venous stasis changes bilaterally;  warmth and erythema in R leg to the calf  Psych: Normal mood and affect. sleepy  Resolved Hospital Problem list   none  Assessment & Plan:  Acute on chronic hypoxic respiratory failure: Likely multifactorial but incited by CAP/bacteremia in setting of severe underlying PAH. Blood cultures grew strep spp, as below. We will continue supplemental O2 as needed to maintain SpO2 > 88%.  - Continue CAP coverage with Ceftriaxone and azithromycin  - PAH, CAP mgmt as below   Severe PAH  HFpEF with cor pulmonale: on Remodulin and sildenafil and followed by Dr. Haroldine Laws and Dr. Gilles Chiquito at [facility].  RHC from 06/27/18 with PA 54, PCW 12, PA sat 59% and 63%.Holding sildenafil given her hypotension. TTE on 4/29 showed decreased PA pressures likely 2/2 worsening RV function rather than true improvement. RA pressure 15, LVEF 55-60%. She has a line for IV administration of Remodulin at home which will need to be removed given bacteremia. She certainly has some volume to give at this point; she received 40 IV Lasix yesterday evening and was net negative 819m; we will be gentle with diuresis to avoid large fluid shifts in the setting of her PAH and sepsis (and past hypokalemia) - Heart failure consulted, appreciate recs.  - Continue home Remodulin - Hold sildenafil  - IV Lasix 20 mg Once today, more as needed, goal negative 500cc as tolerates - strict I/Os -on full dose AC with lovenox.  (on xarelto at home)   Strep bacteremia  CAP  RLE cellulitis: Pt initially presented with multiple days of worsening SOB and  also cellulitis of R lower extremity. CXR difficult to ascertain if there is consolidation/infiltrate in L lung given large cardiac silhouette; R LE improving. BCs grew Strep spp. She was started on Vanc, CTX and azithromycin empirically, now narrowed to CTX and azithro. At this point, source could be cellulitis, CAP, or other source like endocarditis. TTE did not show evidence of vegetations. Consulted ID on 4/30 who recommended removing her line for Remodulin given bacteremia and also a TEE to evaluate for endocarditis.  - Continue CTX 2g qd and Azithromycin 500 mg qd  - ID consulted, appreciate recs:  - TEE  - Remove tunneled line, will have to give via central line; IR contacted .    Sepsis - 2/2 strep bacteremia: Pt presented in sepsis, possible infectious sources as above. Arterial line was placed. Bps most recently 120s/50s; we will discontinue Neo given her PAH. Art line and CVC placed.   - Discontinue Neo, esp given PAH.  - Norepi, titrate down as tolerated  - TEE when stable for procedure  Elevated troponin  H/o HFpEF w/ cor pulmonale - 3208->2856; likely demand ischemia. Pro-BNP 441. Troponin now downtrending. Echo on 05/03/19 showed lower PA pressures from  prior studies likely 2/2 worsening of RV function; LVEF 50-60% and no valvular disease.  - HF recs ASA however pt is allergic (airway swelling). Will not give ASA at this point  - Light diuresis as above    Paroxysmal Atrial Fib: Long h/o PAF on Xarelto and compliant at home. No bleeding signs/sx. Pt has not been in AF during this hospitalization.  - Lovenox in lieu of home Xarelto   FEN: Pt had severe hypokalemia to <2.7 on presentation, presumed 2/2 diarrhea. She is s/p 3 runs of 73mq K in ED. Most recentyl K is 5.0.  - Daily BMP   Type 2 Diabetes Mellitus: Non insulin dependent, on metformin.  - SSI. - Hold home metformin.  Best practice:  Diet: full liquid Pain/Anxiety/Delirium protocol (if indicated): N/A. VAP  protocol (if indicated): N/A. DVT prophylaxis: SCD's / Lovenox. GI prophylaxis: N/A. Glucose control: SSI. Mobility: Bedrest. Code Status: Limited code - no intubation but OK with CPR in the event of arrest. Family Communication: Son updated over the phone 4/29 Disposition: ICU.  Labs   CBC: Recent Labs  Lab 05/02/19 1953 05/03/19 0106 05/03/19 0450  WBC 20.0*  --  33.0*  NEUTROABS 18.4*  --   --   HGB 14.1 15.6* 14.3  HCT 44.8 46.0 45.6  MCV 88.2  --  87.9  PLT 412*  --  435*    Basic Metabolic Panel: Recent Labs  Lab 05/02/19 1953 05/03/19 0106 05/03/19 0450  NA 140 142 139  K 2.7* 2.7* 5.0  CL 101  --  103  CO2 27  --  25  GLUCOSE 104*  --  132*  BUN 12  --  18  CREATININE 0.89  --  1.35*  CALCIUM 8.8*  --  7.8*  MG  --   --  1.5*  PHOS  --   --  3.8   GFR: Estimated Creatinine Clearance: 62.8 mL/min (A) (by C-G formula based on SCr of 1.35 mg/dL (H)). Recent Labs  Lab 05/02/19 1953 05/02/19 2140 05/03/19 0450 05/03/19 1506  WBC 20.0*  --  33.0*  --   LATICACIDVEN 3.1* 3.1*  --  2.7*    Liver Function Tests: Recent Labs  Lab 05/02/19 1953  AST 29  ALT 17  ALKPHOS 73  BILITOT 0.8  PROT 7.5  ALBUMIN 3.7   No results for input(s): LIPASE, AMYLASE in the last 168 hours. No results for input(s): AMMONIA in the last 168 hours.  ABG    Component Value Date/Time   PHART 7.312 (L) 05/03/2019 0106   PCO2ART 58.7 (H) 05/03/2019 0106   PO2ART 69 (L) 05/03/2019 0106   HCO3 28.9 (H) 05/03/2019 0106   TCO2 30 05/03/2019 0106   ACIDBASEDEF 7.0 (H) 07/14/2012 0524   O2SAT 88.0 05/03/2019 0106     Coagulation Profile: Recent Labs  Lab 05/02/19 1953  INR 1.3*    Cardiac Enzymes: No results for input(s): CKTOTAL, CKMB, CKMBINDEX, TROPONINI in the last 168 hours.  HbA1C: Hemoglobin A1C  Date/Time Value Ref Range Status  04/09/2019 01:48 PM 5.5 4.0 - 5.6 % Final  07/11/2018 04:18 PM 6.1 (A) 4.0 - 5.6 % Final   Hgb A1c MFr Bld  Date/Time  Value Ref Range Status  10/14/2010 10:41 AM 13.5 (H) <5.7 % Final    Comment:  According to the ADA Clinical Practice Recommendations for 2011, when HbA1c is used as a screening test:     >=6.5%   Diagnostic of Diabetes Mellitus            (if abnormal result is confirmed)   5.7-6.4%   Increased risk of developing Diabetes Mellitus   References:Diagnosis and Classification of Diabetes Mellitus,Diabetes CLEX,5170,01(VCBSW 1):S62-S69 and Standards of Medical Care in         Diabetes - 2011,Diabetes HQPR,9163,84 (Suppl 1):S11-S61.    CBG: Recent Labs  Lab 05/03/19 1528 05/03/19 1958 05/03/19 2341 05/04/19 0345 05/04/19 0754  GLUCAP 120* 114* 160* 108* 120*    Review of Systems:   Constitutional: weight loss, weight gain, night sweats, fevers, chills, fatigue, weakness.  HEENT: headaches, sore throat, sneezing, nasal congestion, post nasal drip, difficulty swallowing, tooth/dental problems, visual complaints, visual changes, ear aches. Neuro: difficulty with speech, weakness, numbness, ataxia. CV:  chest pain, orthopnea, PND, swelling in lower extremities, dizziness, palpitations, syncope.  Resp: cough, hemoptysis, dyspnea, wheezing. GI: heartburn, indigestion, abdominal pain, nausea, vomiting, diarrhea, constipation, change in bowel habits, loss of appetite, hematemesis, melena, hematochezia.  GU: dysuria, change in color of urine, urgency or frequency, flank pain, hematuria. MSK: joint pain or swelling, decreased range of motion. Psych: change in mood or affect, depression, anxiety, suicidal ideations, homicidal ideations. Skin: rash, itching, bruising.  Past Medical History  She,  has a past medical history of Anemia, iron deficiency, CHF (congestive heart failure) ([place]), Chronic cough, Cognitive impairment, COPD (chronic obstructive pulmonary disease) ([place]), Cor pulmonale ([place]), Depression, Diabetes  mellitus, Diastolic heart failure ([place]), GERD (gastroesophageal reflux disease), H/O mental retardation, Herpes, Hyperlipidemia, Hypertension, OSA (obstructive sleep apnea), Pulmonary hypertension ([place]), Renal disorder, Restrictive lung disease, Shortness of breath, and Venous stasis ulcer ([place]).   Surgical History    Past Surgical History:  Procedure Laterality Date  . CARDIAC CATHETERIZATION N/A 01/13/2016   Procedure: Right Heart Cath;  Surgeon: Jolaine Artist, MD;  Location: [place] CV LAB;  Service: Cardiovascular;  Laterality: N/A;  . CHOLECYSTECTOMY    . LEFT AND RIGHT HEART CATHETERIZATION WITH CORONARY ANGIOGRAM N/A 02/28/2014   Procedure: LEFT AND RIGHT HEART CATHETERIZATION WITH CORONARY ANGIOGRAM;  Surgeon: Jolaine Artist, MD;  Location: [facility] CATH LAB;  Service: Cardiovascular;  Laterality: N/A;  . RIGHT HEART CATH N/A 06/27/2018   Procedure: RIGHT HEART CATH;  Surgeon: Jolaine Artist, MD;  Location: [place] CV LAB;  Service: Cardiovascular;  Laterality: N/A;     Social History   reports that she has never smoked. She has never used smokeless tobacco. She reports that she does not drink alcohol or use drugs.   Family History   Her family history includes Bipolar disorder in her sister; Breast cancer in her maternal aunt; Hyperlipidemia in her mother; Kidney Stones in her son; Mental illness in her sister; Mental retardation in her brother.   Allergies Allergies  Allergen Reactions  . Aspirin     REACTION: airway swelling  . Codeine     REACTION: tingling in lips and hard breathing - had reaction at dentist - states "I can't take certain kinds of codeine" - happened maybe 10 yr ago  . Lisinopril     Cough  . Sulfonamide Derivatives     REACTION: airway swelling  . Latex Rash     Home Medications  Prior to Admission medications   Medication Sig Start Date End Date Taking? Authorizing Provider  acetaminophen (TYLENOL) 500 MG tablet Take  1 tablet (500 mg  total) by mouth every 6 (six) hours as needed. Patient taking differently: Take 500 mg by mouth every 6 (six) hours as needed for mild pain.  08/21/15  Yes [name], MD  cyanocobalamin 500 MCG tablet Take 500 mcg by mouth daily.   Yes [provider]  ferrous sulfate 325 (65 FE) MG tablet Take 1 tablet (325 mg total) by mouth daily with breakfast. 07/23/14  Yes Corky Sox, MD  gabapentin (NEURONTIN) 300 MG capsule TAKE 2 CAPSULES BY MOUTH THREE TIMES DAILY Patient taking differently: Take 1,200 mg by mouth in the morning and at bedtime.  02/12/19  Yes [name], MD  metFORMIN (GLUCOPHAGE) 500 MG tablet TAKE 1/2 (ONE-HALF) TABLET BY MOUTH WITH BREAKFAST Patient taking differently: Take 250 mg by mouth daily.  10/25/18  Yes [name], MD  pantoprazole (PROTONIX) 20 MG tablet Take 1 tablet (20 mg total) by mouth 2 (two) times daily. 02/28/19  Yes [name], MD  potassium chloride SA (KLOR-CON) 20 MEQ tablet Take 2 tablets by mouth twice daily Patient taking differently: Take 40 mEq by mouth 2 (two) times daily.  03/13/19  Yes [name], MD  pravastatin (PRAVACHOL) 20 MG tablet Take 1 tablet by mouth once daily Patient taking differently: Take 20 mg by mouth daily.  08/08/18  Yes [name], MD  sildenafil (REVATIO) 20 MG tablet Take 1 tablet (20 mg total) by mouth 3 (three) times daily. Patient taking differently: Take 10 mg by mouth daily.  01/17/19  Yes Bensimhon, [name], MD  torsemide (DEMADEX) 20 MG tablet Take 80 mg by mouth 2 (two) times daily.   Yes [provider]  treprostinil (REMODULIN) 5 MG/ML SOLN injection Inject 0.667 ng/kg/min into the skin continuous.    Yes [provider]  XARELTO 20 MG TABS tablet TAKE 1 TABLET BY MOUTH ONCE DAILY WITH SUPPER Patient taking differently: Take 20 mg by mouth daily with supper.  03/07/19  Yes [name], MD  ACCU-CHEK AVIVA PLUS test strip USE 1 STRIP TO CHECK GLUCOSE  ONCE DAILY 05/25/18   [name], MD  cetirizine (ZYRTEC) 10 MG tablet Take 1 tablet by mouth once daily Patient not taking: Reported on 05/02/2019 01/29/19   [name], MD  cyclobenzaprine (FLEXERIL) 5 MG tablet Take 1 tablet (5 mg total) by mouth daily as needed for muscle spasms. Patient not taking: Reported on 05/02/2019 06/07/18   Neva Seat, MD  diclofenac sodium (VOLTAREN) 1 % GEL Apply 2 g topically 4 (four) times daily. Patient not taking: Reported on 05/02/2019 06/07/18   Neva Seat, MD  fluconazole (DIFLUCAN) 150 MG tablet Take 1 tablet (150 mg total) by mouth every 3 (three) days. Patient not taking: Reported on 05/02/2019 04/11/19   [name], MD  fluticasone [facility]) 50 MCG/ACT nasal spray Place 1 spray into both nostrils daily. Patient not taking: Reported on 05/02/2019 01/05/18 01/05/19  Mosetta Anis, MD  mupirocin ointment (BACTROBAN) 2 % Place 1 application into the nose 2 (two) times daily. Patient not taking: Reported on 06/15/2018 02/25/17   Ledell Noss, MD  nystatin cream (MYCOSTATIN) Apply 1 application topically 2 (two) times daily. Patient not taking: Reported on 06/15/2018 08/20/16   Constant, Peggy, MD  sodium chloride (OCEAN) 0.65 % SOLN nasal spray Place 2 sprays into both nostrils as needed for congestion. Patient not taking: Reported on 05/02/2019 02/12/16   [name], MD  spironolactone (ALDACTONE)  25 MG tablet Take 1/2 (one-half) tablet by mouth once daily Patient not taking: Reported on 05/02/2019 10/31/18   Bartholomew Crews, MD  torsemide (DEMADEX) 20 MG tablet Take 4 tablets (80 mg total) by mouth 2 (two) times daily. Patient not taking: Reported on 05/02/2019 07/03/18 05/02/19  Jean Rosenthal, MD  cetirizine (ZYRTEC) 10 MG tablet Take 1 tablet by mouth once daily 07/24/18   [name], MD     Critical care time: [address] min    Ivin Poot, MS4  Collier Bullock, MD

## 2019-05-04 NOTE — Progress Notes (Addendum)
Pharmacy Note (late entry):  Notified of patient's Remodulin pump alarming. Came to bedside and called [facility]. Patient's pump had internal pump failure per [facility] [phone]) and pump was changed to patient's home back up pump. [facility] reported failure and is sending new back up pump to patient's home. Patient's son, Legrand Como, made aware. Pump off time ~30 minutes. Drug terminal half-life is 4 hours. Patient was asymptomatic with no signs of distress. Pump settings were verified and pump was running correctly upon leaving the room. If issue were to arise again, pharmacy has a back up pump programmed to patient's settings available for use.   Sloan Leiter, PharmD, BCPS, BCCCP Clinical Pharmacist Please refer to [facility] Pharmacy numbers

## 2019-05-04 NOTE — Procedures (Addendum)
Central Venous Catheter Insertion Procedure Note KATEENA DEGROOTE [id number] 1960-12-12  Procedure: Insertion of Central Venous Catheter Indications: Assessment of intravascular volume, Drug and/or fluid administration and Frequent blood sampling  Procedure Details Consent: Unable to obtain consent because of emergent medical necessity. Time Out: Verified patient identification, verified procedure, site/side was marked, verified correct patient position, special equipment/implants available, medications/allergies/relevent history reviewed, required imaging and test results available.  Performed  Maximum sterile technique was used including antiseptics, cap, gloves, gown, hand hygiene, mask and sheet. Skin prep: Chlorhexidine; local anesthetic administered A antimicrobial bonded/coated triple lumen catheter was placed in the left internal jugular vein using the Seldinger technique and verified with [place].         Evaluation Blood flow good Complications: No apparent complications Patient did tolerate procedure well. Chest X-ray ordered to verify placement.  CXR: pending.  Otilio Carpen Gleason 05/04/2019, 10:40 PM   Post procedure CXR shows left IJ in good position in distal SVC.  No acute complications note.     Cheryll Dessert, MD

## 2019-05-04 NOTE — Progress Notes (Signed)
[facility] Progress Note Patient Name: [name] DOB: 01-Apr-1960 MRN: [id number]   Date of Service  05/04/2019  HPI/Events of Note  Pt needs something for pain, she is on BIPAP and very high risk for requiring intubation.  eICU Interventions  Iv Tylenol 1 gm Q 6 hours PRN x 4 doses.        Kerry Kass Matteus Mcnelly 05/04/2019, 9:06 PM

## 2019-05-04 NOTE — Progress Notes (Signed)
Called Dr.Gonzalez with panic ABG values after placing pt on Bipap @ 1452.  ABG values PH 7.21 C02 71 P02 57 HC03 29.1

## 2019-05-04 NOTE — Progress Notes (Addendum)
Advanced Heart Failure Rounding Note   Subjective:    Remains on NE. Of Neo.   Respiratory status remains tenuous now on HFNC and NRB  Tmax 101.2. WBC 33.3K -> 45K   Objective:   Weight Range:  Vital Signs:   Temp:  [97.8 F (36.6 C)-101.2 F (38.4 C)] 98.2 F (36.8 C) (04/30 0700) Pulse Rate:  [85-113] 101 (04/30 1100) Resp:  [18-37] 19 (04/30 1100) BP: (88-118)/(31-73) 106/57 (04/30 1100) SpO2:  [87 %-97 %] 91 % (04/30 1100) Arterial Line BP: (87-136)/(38-62) 110/50 (04/30 0800) Weight:  [135.9 kg] 135.9 kg (04/30 0500) Last BM Date: 05/04/19  Weight change: Filed Weights   05/03/19 0451 05/04/19 0500  Weight: 134.4 kg 135.9 kg    Intake/Output:   Intake/Output Summary (Last 24 hours) at 05/04/2019 1150 Last data filed at 05/04/2019 1116 Gross per 24 hour  Intake 1851.78 ml  Output 1580 ml  Net 271.78 ml     Physical Exam: General:  Ill appearing. On FM HEENT: normal Neck: supple. JVP hard to see . Carotids 2+ bilat; no bruits. No lymphadenopathy or thryomegaly appreciated. Cor: PMI nondisplaced. Tachy regular. Left [place] hickman Lungs: coarse Abdomen: obese soft, nontender, nondistended. No hepatosplenomegaly. No bruits or masses. Good bowel sounds. Extremities: no cyanosis, clubbing, rash . Severe bullous erythema on RLE>>LLE + edema Neuro: alert & orientedx3, cranial nerves grossly intact. moves all 4 extremities w/o difficulty. Affect pleasant  Telemetry: Sinus 100-110 Personally reviewed   Labs: Basic Metabolic Panel: Recent Labs  Lab 05/02/19 1953 05/03/19 0106 05/03/19 0450 05/04/19 1022  NA 140 142 139 138  K 2.7* 2.7* 5.0 3.3*  CL 101  --  103 103  CO2 27  --  25 24  GLUCOSE 104*  --  132* 148*  BUN 12  --  18 30*  CREATININE 0.89  --  1.35* 1.26*  CALCIUM 8.8*  --  7.8* 7.5*  MG  --   --  1.5* 2.2  PHOS  --   --  3.8  --     Liver Function Tests: Recent Labs  Lab 05/02/19 1953  AST 29  ALT 17  ALKPHOS 73  BILITOT 0.8    PROT 7.5  ALBUMIN 3.7   No results for input(s): LIPASE, AMYLASE in the last 168 hours. No results for input(s): AMMONIA in the last 168 hours.  CBC: Recent Labs  Lab 05/02/19 1953 05/03/19 0106 05/03/19 0450 05/04/19 1022  WBC 20.0*  --  33.0* 45.5*  NEUTROABS 18.4*  --   --   --   HGB 14.1 15.6* 14.3 13.7  HCT 44.8 46.0 45.6 43.4  MCV 88.2  --  87.9 90.2  PLT 412*  --  435* 295    Cardiac Enzymes: No results for input(s): CKTOTAL, CKMB, CKMBINDEX, TROPONINI in the last 168 hours.  BNP: BNP (last 3 results) Recent Labs    06/15/18 0744 05/02/19 2350  BNP 49.0 441.1*    ProBNP (last 3 results) No results for input(s): PROBNP in the last 8760 hours.    Other results:  Imaging: DG CHEST PORT 1 VIEW  Result Date: 05/03/2019 CLINICAL DATA:  BiPAP counseling EXAM: PORTABLE CHEST 1 VIEW COMPARISON:  05/03/2019, 05/02/2019, 06/25/2018 FINDINGS: Bilateral central venous catheter tips overlie the upper SVC. Stable enlarged cardiomediastinal silhouette with vascular congestion. Probable small left effusion. Generous central pulmonary vessels suggesting arterial hypertension. No pneumothorax. IMPRESSION: Cardiomegaly with vascular congestion and probable small left effusion. Enlarged central pulmonary vessels suggesting arterial  hypertension. Overall no significant change since prior radiograph Electronically Signed   By: Donavan Foil M.D.   On: 05/03/2019 19:33   DG CHEST PORT 1 VIEW  Result Date: 05/03/2019 CLINICAL DATA:  59 year old female status post central line placement. EXAM: PORTABLE CHEST 1 VIEW COMPARISON:  Chest x-ray 05/03/2019. FINDINGS: There is a left-sided internal jugular central venous catheter with tip terminating in the distal superior vena cava. There is a right-sided internal jugular central venous catheter with tip terminating in the distal superior vena cava. Lung volumes are normal. There is cephalization of the pulmonary vasculature and slight  indistinctness of the interstitial markings suggestive of mild pulmonary edema. Central pulmonary arteries are markedly dilated. No definite pleural effusions. No pneumothorax. Mild cardiomegaly. Upper mediastinal contours are within normal limits. IMPRESSION: 1. Support apparatus, as above. 2. The appearance the chest suggests mild congestive heart failure. 3. Dilated central pulmonary arteries again noted, suggesting pulmonary arterial hypertension. Electronically Signed   By: Vinnie Langton M.D.   On: 05/03/2019 14:39   DG Chest Port 1 View  Result Date: 05/03/2019 CLINICAL DATA:  Respiratory failure. EXAM: PORTABLE CHEST 1 VIEW COMPARISON:  05/02/2019 FINDINGS: Stable cardiomegaly. Right-sided central venous catheter remains in appropriate position. Stable enlarged central pulmonary arteries, consistent with pulmonary arterial hypertension both lungs are clear. IMPRESSION: Stable cardiomegaly, and enlarged central pulmonary arteries consistent with pulmonary arterial hypertension. No active lung disease. Electronically Signed   By: Marlaine Hind M.D.   On: 05/03/2019 08:28   DG Chest Portable 1 View  Result Date: 05/02/2019 CLINICAL DATA:  Shortness of breath EXAM: PORTABLE CHEST 1 VIEW COMPARISON:  06/19/2018 FINDINGS: Right-sided central venous catheter tip over the cavoatrial region. Cardiomegaly with central vascular congestion. Enlarged central pulmonary vessels suggesting arterial hypertension. No consolidation or effusion. No pneumothorax. IMPRESSION: 1. Cardiomegaly with central vascular congestion 2. Enlarged central pulmonary vessels suggesting arterial hypertension Electronically Signed   By: Donavan Foil M.D.   On: 05/02/2019 20:51   ECHOCARDIOGRAM COMPLETE  Result Date: 05/03/2019    ECHOCARDIOGRAM REPORT   Patient Name:   KEYANAH KOZICKI Date of Exam: 05/03/2019 Medical Rec #:  [id number]       Height:       63.5 in Accession #:    [id number]      Weight:       296.3 lb Date of Birth:   01-11-1960       BSA:          2.299 m Patient Age:    59 years        BP:           112/66 mmHg Patient Gender: F               HR:           106 bpm. Exam Location:  Inpatient Procedure: 2D Echo, Cardiac Doppler, Color Doppler and Intracardiac            Opacification Agent Indications:    Respiratory Failure with Hypoxia [id number]  History:        Patient has prior history of Echocardiogram examinations, most                 recent 11/07/2014. CHF, Pulmonary HTN and COPD,                 Signs/Symptoms:Dyspnea; Risk Factors:Hypertension, Diabetes,                 Dyslipidemia, Sleep Apnea and  GERD.  Sonographer:    Tiffany Dance Referring Phys: [id number] RAHUL P DESAI IMPRESSIONS  1. Pulmonary pressures are much lower than on previous studies. Given the reduced right ventricular systolic function, this may represent progressive right ventricular dysfunction rather than improvement.  2. Left ventricular ejection fraction, by estimation, is 65 to 70%. The left ventricle has normal function. The left ventricle has no regional wall motion abnormalities. Left ventricular diastolic parameters are consistent with Grade I diastolic dysfunction (impaired relaxation).  3. Right ventricular systolic function is moderately reduced. The right ventricular size is moderately enlarged. There is normal pulmonary artery systolic pressure.  4. The mitral valve is normal in structure. No evidence of mitral valve regurgitation. No evidence of mitral stenosis.  5. The aortic valve is tricuspid. Aortic valve regurgitation is not visualized. No aortic stenosis is present.  6. The inferior vena cava is dilated in size with <50% respiratory variability, suggesting right atrial pressure of 15 mmHg. FINDINGS  Left Ventricle: Left ventricular ejection fraction, by estimation, is 65 to 70%. The left ventricle has normal function. The left ventricle has no regional wall motion abnormalities. Definity contrast agent was given IV to delineate the left  ventricular  endocardial borders. The left ventricular internal cavity size was normal in size. There is no left ventricular hypertrophy. Left ventricular diastolic parameters are consistent with Grade I diastolic dysfunction (impaired relaxation). Right Ventricle: The right ventricular size is moderately enlarged. No increase in right ventricular wall thickness. Right ventricular systolic function is moderately reduced. There is normal pulmonary artery systolic pressure. The tricuspid regurgitant velocity is 2.20 m/s, and with an assumed right atrial pressure of 15 mmHg, the estimated right ventricular systolic pressure is 06.3 mmHg. Left Atrium: Left atrial size was normal in size. Right Atrium: Right atrial size was normal in size. Pericardium: There is no evidence of pericardial effusion. Mitral Valve: The mitral valve is normal in structure. Normal mobility of the mitral valve leaflets. No evidence of mitral valve regurgitation. No evidence of mitral valve stenosis. Tricuspid Valve: The tricuspid valve is normal in structure. Tricuspid valve regurgitation is trivial. No evidence of tricuspid stenosis. Aortic Valve: The aortic valve is tricuspid. Aortic valve regurgitation is not visualized. No aortic stenosis is present. Pulmonic Valve: The pulmonic valve was normal in structure. Pulmonic valve regurgitation is not visualized. No evidence of pulmonic stenosis. Aorta: The aortic root is normal in size and structure. Venous: The inferior vena cava is dilated in size with less than 50% respiratory variability, suggesting right atrial pressure of 15 mmHg. IAS/Shunts: No atrial level shunt detected by color flow Doppler.  LEFT VENTRICLE PLAX 2D LVIDd:         3.11 cm LVIDs:         2.56 cm LV PW:         1.56 cm LV IVS:        2.06 cm LVOT diam:     2.00 cm LV SV:         57 LV SV Index:   25 LVOT Area:     3.14 cm  RIGHT VENTRICLE             IVC RV Basal diam:  4.23 cm     IVC diam: 3.31 cm RV Mid diam:    3.45  cm RV S prime:     17.40 cm/s TAPSE (M-mode): 2.0 cm LEFT ATRIUM             Index  RIGHT ATRIUM           Index LA diam:        4.20 cm 1.83 cm/m  RA Area:     19.50 cm LA Vol (A2C):   65.9 ml 28.67 ml/m RA Volume:   54.60 ml  23.75 ml/m LA Vol (A4C):   39.5 ml 17.18 ml/m LA Biplane Vol: 53.6 ml 23.32 ml/m  AORTIC VALVE LVOT Vmax:   82.80 cm/s LVOT Vmean:  61.500 cm/s LVOT VTI:    0.183 m  AORTA Ao Root diam: 3.30 cm Ao Asc diam:  3.20 cm MITRAL VALVE                TRICUSPID VALVE MV Area (PHT): 2.87 cm     TR Peak grad:   19.4 mmHg MV Decel Time: 264 msec     TR Vmax:        220.00 cm/s MV E velocity: 93.00 cm/s MV A velocity: 125.00 cm/s  SHUNTS MV E/A ratio:  0.74         Systemic VTI:  0.18 m                             Systemic Diam: 2.00 cm Skeet Latch MD Electronically signed by Skeet Latch MD Signature Date/Time: 05/03/2019/4:28:27 PM    Final       Medications:     Scheduled Medications: . Chlorhexidine Gluconate Cloth  6 each Topical Daily  . enoxaparin (LOVENOX) injection  130 mg Subcutaneous Q12H  . ferrous sulfate  325 mg Oral Q breakfast  . gabapentin  600 mg Oral BID  . insulin aspart  0-20 Units Subcutaneous Q4H  . pantoprazole (PROTONIX) IV  40 mg Intravenous QHS  . pravastatin  20 mg Oral Daily  . vitamin B-12  500 mcg Oral Daily     Infusions: . sodium chloride 20 mL/hr at 05/04/19 0320  . azithromycin 500 mg (05/04/19 1147)  . cefTRIAXone (ROCEPHIN)  IV Stopped (05/04/19 0827)  . norepinephrine (LEVOPHED) Adult infusion 7 mcg/min (05/04/19 1100)  . phenylephrine (NEO-SYNEPHRINE) Adult infusion Stopped (05/04/19 0839)  . potassium chloride 10 mEq (05/04/19 1141)  . treprostinil (REMODULIN) infusion 81 ng/kg/min (05/02/19 2300)     PRN Medications:  docusate sodium, polyethylene glycol   Assessment/Plan:   1. Shock, septic - Likely source lower extremity cellulitis.. She also has hickman catheter in R upper chest. Blood Cultures + for  Strep Group G - Now on rocephin and azithromycin - Remains on NE. Now off neo. Wean NE as tolerated - Lactic acid coming down. Remains febrile and WBC increasing - I remained concerned that LE cellulitis may be polymicrobial. There has been no improvement with current abx dose but also limited by severe edema. ID has been consulted to help with abx. Would consider broadening until clinically more stable - will need removal of Hickman cath - ideally would get TEE but likely respiratory status won't tolerate and may need to consider longer abx course  2. LE cellulitis - plan as above - will also place WOC consult. Will need compression and diuresis as tolerated  3. A/C Hypoxic Respiratory Failure - Hypoxic on admit. Chronically on 4 liters but placed on HFNC/NRB this morning.  - Tenuous but improving - diurese as tolerated  4. NSTEMI  -Trend 779>3903> 2856. Suspect demand ischemia - -2015 had myoviewIn EF 78% and no ischemia  - Not having chest pain. EF stable  on echo - can consider cath as course determines  5. Severe PAH with Cor Pulmonale: - On IV remodulin with [facility]. Need to continue - [place] sildenafil with hypotension - Will need hickman out for sepsis and infuse via central line or PICC   6. PAF - in NSR. On lovenox  7. Acute on chronic diastolic HF - diurese as tolerated  D/w CCM team. The HF/PAH team will see again on Monday. Please page over the weekend with questions.   CRITICAL CARE Performed by: Glori Bickers  Total critical care time: 35 minutes  Critical care time was exclusive of separately billable procedures and treating other patients.  Critical care was necessary to treat or prevent imminent or life-threatening deterioration.  Critical care was time spent personally by me (independent of midlevel providers or residents) on the following activities: development of treatment plan with patient and/or surrogate as well as nursing, discussions with  consultants, evaluation of patient's response to treatment, examination of patient, obtaining history from patient or surrogate, ordering and performing treatments and interventions, ordering and review of laboratory studies, ordering and review of radiographic studies, pulse oximetry and re-evaluation of patient's condition.   Length of Stay: 2   Glori Bickers MD 05/04/2019, 11:50 AM  Advanced Heart Failure Team Pager [phone] (M-F; 7a - 4p)  Please contact [facility] for night-coverage after hours (4p -7a ) and weekends on [email or web address]

## 2019-05-04 NOTE — Consult Note (Signed)
WOC Nurse Consult Note: Patient receiving care in [facility] 2M13.  Primary RN in room at the time of my visit. Reason for Consult: "cellulitis of legs" Wound type: no wounds observed on BLE Pressure Injury POA: Yes/No/NA Measurement: Wound bed: Drainage (amount, consistency, odor)  Periwound: Dressing procedure/placement/frequency: The legs are grossly edematous, no weeping noted, no wounds noted. Erythema to BLE present, R>L.  I do not see a recent study to r/o DVT to legs, therefore, I am ordering roll gauze (kerlex) and 6 inch Ace Wraps (bandages) from behind the toes to below the knees bilaterally.  Also, elevation of heels off of the mattress with multiple pillows, and a bariatric Size Wise bed with air mattress for pressure relief and microclimate management. Thank you for the consult.  Discussed plan of care with the patient and bedside nurse.  [place] nurse will not follow at this time.  Please re-consult the [place] team if needed.  Val Riles, RN, MSN, CWOCN, CNS-BC, pager [phone]

## 2019-05-05 DIAGNOSIS — B955 Unspecified streptococcus as the cause of diseases classified elsewhere: Secondary | ICD-10-CM

## 2019-05-05 DIAGNOSIS — J9622 Acute and chronic respiratory failure with hypercapnia: Secondary | ICD-10-CM

## 2019-05-05 DIAGNOSIS — J9621 Acute and chronic respiratory failure with hypoxia: Secondary | ICD-10-CM | POA: Diagnosis not present

## 2019-05-05 DIAGNOSIS — R570 Cardiogenic shock: Secondary | ICD-10-CM

## 2019-05-05 DIAGNOSIS — G9341 Metabolic encephalopathy: Secondary | ICD-10-CM | POA: Diagnosis not present

## 2019-05-05 DIAGNOSIS — I2721 Secondary pulmonary arterial hypertension: Secondary | ICD-10-CM | POA: Diagnosis not present

## 2019-05-05 DIAGNOSIS — L0390 Cellulitis, unspecified: Secondary | ICD-10-CM

## 2019-05-05 DIAGNOSIS — R7881 Bacteremia: Secondary | ICD-10-CM | POA: Diagnosis not present

## 2019-05-05 DIAGNOSIS — R6521 Severe sepsis with septic shock: Secondary | ICD-10-CM

## 2019-05-05 HISTORY — PX: IR REMOVAL TUN CV CATH W/O FL: IMG2289

## 2019-05-05 HISTORY — DX: Cellulitis, unspecified: L03.90

## 2019-05-05 LAB — GLUCOSE, CAPILLARY
Glucose-Capillary: 107 mg/dL — ABNORMAL HIGH (ref 70–99)
Glucose-Capillary: 109 mg/dL — ABNORMAL HIGH (ref 70–99)
Glucose-Capillary: 118 mg/dL — ABNORMAL HIGH (ref 70–99)
Glucose-Capillary: 130 mg/dL — ABNORMAL HIGH (ref 70–99)
Glucose-Capillary: 137 mg/dL — ABNORMAL HIGH (ref 70–99)
Glucose-Capillary: 147 mg/dL — ABNORMAL HIGH (ref 70–99)

## 2019-05-05 LAB — BASIC METABOLIC PANEL
Anion gap: 10 (ref 5–15)
Anion gap: 10 (ref 5–15)
BUN: 39 mg/dL — ABNORMAL HIGH (ref 6–20)
BUN: 41 mg/dL — ABNORMAL HIGH (ref 6–20)
CO2: 27 mmol/L (ref 22–32)
CO2: 26 mmol/L (ref 22–32)
Calcium: 8 mg/dL — ABNORMAL LOW (ref 8.9–10.3)
Calcium: 7.8 mg/dL — ABNORMAL LOW (ref 8.9–10.3)
Chloride: 103 mmol/L (ref 98–111)
Chloride: 104 mmol/L (ref 98–111)
Creatinine, Ser: 1.5 mg/dL — ABNORMAL HIGH (ref 0.44–1.00)
Creatinine, Ser: 1.54 mg/dL — ABNORMAL HIGH (ref 0.44–1.00)
GFR calc Af Amer: 44 mL/min — ABNORMAL LOW (ref >60)
GFR calc Af Amer: 43 mL/min — ABNORMAL LOW (ref >60)
GFR calc non Af Amer: 38 mL/min — ABNORMAL LOW (ref >60)
GFR calc non Af Amer: 37 mL/min — ABNORMAL LOW (ref >60)
Glucose, Bld: 158 mg/dL — ABNORMAL HIGH (ref 70–99)
Glucose, Bld: 133 mg/dL — ABNORMAL HIGH (ref 70–99)
Potassium: 3.5 mmol/L (ref 3.5–5.1)
Potassium: 3.9 mmol/L (ref 3.5–5.1)
Sodium: 140 mmol/L (ref 135–145)
Sodium: 140 mmol/L (ref 135–145)

## 2019-05-05 LAB — POCT I-STAT 7, (LYTES, BLD GAS, ICA,H+H)
Acid-base deficit: 1 mmol/L (ref 0.0–2.0)
Acid-base deficit: 1 mmol/L (ref 0.0–2.0)
Bicarbonate: 29 mmol/L — ABNORMAL HIGH (ref 20.0–28.0)
Bicarbonate: 29.2 mmol/L — ABNORMAL HIGH (ref 20.0–28.0)
Calcium, Ion: 1.16 mmol/L (ref 1.15–1.40)
Calcium, Ion: 1.19 mmol/L (ref 1.15–1.40)
HCT: 45 % (ref 36.0–46.0)
HCT: 46 % (ref 36.0–46.0)
Hemoglobin: 15.3 g/dL — ABNORMAL HIGH (ref 12.0–15.0)
Hemoglobin: 15.6 g/dL — ABNORMAL HIGH (ref 12.0–15.0)
O2 Saturation: 92 %
O2 Saturation: 92 %
Patient temperature: 97.9
Patient temperature: 98.3
Potassium: 3.4 mmol/L — ABNORMAL LOW (ref 3.5–5.1)
Potassium: 4.1 mmol/L (ref 3.5–5.1)
Sodium: 138 mmol/L (ref 135–145)
Sodium: 140 mmol/L (ref 135–145)
TCO2: 31 mmol/L (ref 22–32)
TCO2: 31 mmol/L (ref 22–32)
pCO2 arterial: 71.3 mmHg (ref 32.0–48.0)
pCO2 arterial: 73.8 mmHg (ref 32.0–48.0)
pH, Arterial: 7.203 — ABNORMAL LOW (ref 7.350–7.450)
pH, Arterial: 7.216 — ABNORMAL LOW (ref 7.350–7.450)
pO2, Arterial: 79 mmHg — ABNORMAL LOW (ref 83.0–108.0)
pO2, Arterial: 81 mmHg — ABNORMAL LOW (ref 83.0–108.0)

## 2019-05-05 LAB — COOXEMETRY PANEL
Carboxyhemoglobin: 0.7 % (ref 0.5–1.5)
Methemoglobin: 0.9 % (ref 0.0–1.5)
O2 Saturation: 76.9 %
Total hemoglobin: 12.7 g/dL (ref 12.0–16.0)

## 2019-05-05 LAB — CULTURE, BLOOD (ROUTINE X 2)
Special Requests: ADEQUATE
Special Requests: ADEQUATE

## 2019-05-05 LAB — CBC
HCT: 41.2 % (ref 36.0–46.0)
Hemoglobin: 12.7 g/dL (ref 12.0–15.0)
MCH: 28.3 pg (ref 26.0–34.0)
MCHC: 30.8 g/dL (ref 30.0–36.0)
MCV: 91.8 fL (ref 80.0–100.0)
Platelets: 227 10*3/uL (ref 150–400)
RBC: 4.49 MIL/uL (ref 3.87–5.11)
RDW: 17.3 % — ABNORMAL HIGH (ref 11.5–15.5)
WBC: 45.3 10*3/uL — ABNORMAL HIGH (ref 4.0–10.5)
nRBC: 0.2 % (ref 0.0–0.2)

## 2019-05-05 LAB — LACTIC ACID, PLASMA
Lactic Acid, Venous: 1.2 mmol/L (ref 0.5–1.9)
Lactic Acid, Venous: 0.9 mmol/L (ref 0.5–1.9)

## 2019-05-05 MED ORDER — DEXMEDETOMIDINE HCL IN NACL 400 MCG/100ML IV SOLN
0.2000 ug/kg/h | INTRAVENOUS | Status: DC
  Administered 2019-05-05: 05:00:00 0.2 ug/kg/h via INTRAVENOUS
  Administered 2019-05-06: 02:00:00 0.1 ug/kg/h via INTRAVENOUS
  Administered 2019-05-06: 0.5 ug/kg/h via INTRAVENOUS
  Administered 2019-05-06: 14:00:00 0.3 ug/kg/h via INTRAVENOUS
  Administered 2019-05-07 – 2019-05-08 (×3): 0.2 ug/kg/h via INTRAVENOUS
  Administered 2019-05-08: 03:00:00 0.3 ug/kg/h via INTRAVENOUS
  Filled 2019-05-05 (×8): qty 100

## 2019-05-05 MED ORDER — POTASSIUM CHLORIDE 10 MEQ/50ML IV SOLN
10.0000 meq | INTRAVENOUS | Status: AC
  Administered 2019-05-05 (×6): 10 meq via INTRAVENOUS
  Filled 2019-05-05 (×6): qty 50

## 2019-05-05 MED ORDER — FUROSEMIDE 10 MG/ML IJ SOLN
80.0000 mg | Freq: Two times a day (BID) | INTRAMUSCULAR | Status: DC
  Administered 2019-05-05 – 2019-05-07 (×5): 80 mg via INTRAVENOUS
  Filled 2019-05-05 (×6): qty 8

## 2019-05-05 MED ORDER — WHITE PETROLATUM EX OINT
TOPICAL_OINTMENT | CUTANEOUS | Status: AC
  Administered 2019-05-05: 0.2
  Filled 2019-05-05: qty 28.35

## 2019-05-05 MED ORDER — SODIUM CHLORIDE 0.9 % IV SOLN
2.0000 g | INTRAVENOUS | Status: DC
  Administered 2019-05-05 – 2019-05-07 (×3): 2 g via INTRAVENOUS
  Filled 2019-05-05 (×3): qty 20

## 2019-05-05 MED ORDER — ACETAMINOPHEN 10 MG/ML IV SOLN
1000.0000 mg | Freq: Four times a day (QID) | INTRAVENOUS | Status: AC | PRN
  Administered 2019-05-05: 1000 mg via INTRAVENOUS
  Filled 2019-05-05: qty 100

## 2019-05-05 MED ORDER — FUROSEMIDE 10 MG/ML IJ SOLN
80.0000 mg | Freq: Once | INTRAMUSCULAR | Status: AC
  Administered 2019-05-05: 80 mg via INTRAVENOUS
  Filled 2019-05-05: qty 8

## 2019-05-05 MED ORDER — TREPROSTINIL 100 MG/20ML IJ SOLN
81.0000 ng/kg/min | INTRAVENOUS | Status: AC
  Filled 2019-05-05: qty 7

## 2019-05-05 MED ORDER — TREPROSTINIL 100 MG/20ML IJ SOLN
81.0000 ng/kg/min | INTRAVENOUS | Status: DC
  Administered 2019-05-06 – 2019-05-22 (×9): 81 ng/kg/min via INTRAVENOUS
  Filled 2019-05-05 (×13): qty 7

## 2019-05-05 MED ORDER — ONDANSETRON HCL 4 MG/2ML IJ SOLN: 4.0000 mg | Freq: Four times a day (QID) | INTRAMUSCULAR | Status: DC | PRN

## 2019-05-05 NOTE — Progress Notes (Signed)
[facility] Progress Note Patient Name: [name] DOB: [birth year] MRN: [id number]   Date of Service  05/05/2019  HPI/Events of Note  Pt with frequent watery stools  eICU Interventions  Flexiseal ordered.        Kerry Kass Makenzie Weisner 05/05/2019, 9:07 PM

## 2019-05-05 NOTE — Procedures (Signed)
PROCEDURE SUMMARY:  Successful removal of tunneled central venous catheter.  Patient tolerated well.  EBL none  See full dictation in Imaging for details.  Shonique Pelphrey S Val Farnam PA-C 05/05/2019 12:21 PM

## 2019-05-05 NOTE — Progress Notes (Signed)
Patient pulled out arterial line. MD notified

## 2019-05-05 NOTE — Progress Notes (Signed)
Patient pulled out her central line. MD notified.

## 2019-05-05 NOTE — Progress Notes (Signed)
PCCM interval progress:   59 y.o. F admitted two days ago, history of severe PAH, with sepsis and strep bacteremia who was increasingly obtunded so was placed on Bipap.  This evening, pt pulled out her central line. On evaluation, pt was minimally responsive. She is a confirmed DNI.  Repeat ABG worsened with pH 7.18, pCO2 80, PO2 73.  Pt evaluated with PCCM attending MD, Bipap setting changed from 14/8 to 22/10.  Bedside US with dilated and non-collapsible IVC, pt received Lasix 73m x1 yesterday, but is still 3L positive. Neosynephrine d/c'd earlier, remains on levophed   P: -Lasix 812m1, monitor response -repeat ABG in 1hr -CVC replaced -Pt is critically ill, attempted to reach son to discuss her worsening clinical status, but there was no answer   LaOtilio Carpenleason, PA-C

## 2019-05-05 NOTE — Progress Notes (Signed)
RT's attempted to place arterial line in the right radial with no success each attempt.  RN present and notified MD.

## 2019-05-05 NOTE — Progress Notes (Addendum)
NAME:  [name], MRN:  [id number], DOB:  December 10, 1960, LOS: 3 ADMISSION DATE:  05/02/2019, CONSULTATION DATE:  05/02/19 REFERRING MD:  Laverta Baltimore, CHIEF COMPLAINT:  Dypsnea   Brief History   [name] is a 59 y.o. female with a pertinent PMH of PAH on Remodulin and Sildenafil (Mean PAP 54, PCW 12), OSA, morbid obesity, DM2, CKD, who presented with fever and productive cough and was admitted with sepsis 2/2 CAP and possible RLE cellulitis along with acute on chronic hypoxic respiratory failure.   History of present illness   [name] is a 59 y.o. female who has a PMH including but not limited to [place] on treprostinol, CHF, PAF on xarelto, Morbid obesity (see "past medical history" for rest).  She presented to [facility] 4/28 with fever and productive cough with associated diarrhea and generalized weakness.  She states diarrhea has occurred "for a few days" but over the past 1 - 2 days, she also began to develop chills and generalized weakness.  Denies chest pain, N/V, abd pain, myalgias.  In ED, she was found to be hypoxic to the 80's despite her baseline of 4L O2.  She was increased to 8L with improvement in sats to low 90's.  She was also hypotensive to the 70's SBP.  She was started on gentle IVF hydration given her hx of PAH and current respiratory failure.  CXR showed possible retrocardiac infiltrate and she was febrile to 103F. She was also found to have findings c/w RLE cellulitis (has venous stasis change to BLE's; however, RLE warmer compared to LLE).  PCCM called for admission.  Past Medical History  has ANEMIA, B12 DEFICIENCY; Obstructive sleep apnea; GERD; Obesity; Diabetes ([place]); Hyperlipidemia; Allergic rhinitis; Right heart failure, NYHA class 3 ([place]); Pulmonary hypertension ([place]); Atrial flutter ([place]); Acute on chronic diastolic (congestive) heart failure ([place]); Peripheral neuropathy ([place]); Healthcare maintenance; Rash; Postmenopausal vaginal bleeding; Lower extremity edema;  Ovarian cyst; Hematoma of arm, left, subsequent encounter; Abdominal muscle strain, initial encounter; Acute on chronic respiratory failure ([place]); CAP (community acquired pneumonia); Atelectasis; Urinary incontinence; Bleeding hemorrhoid; and Vaginal itching on their problem list.  [facility] Events   4/28 > admit started on HFNC and transitioned to NIPPV 2/2 to desaturation   Consults:  Heart Failure  Procedures:  4/28 Art line pending> 4/29: CVC >> pulled out 4/30   Significant Diagnostic Tests:  4/28 CXR > possible retrocardiac infiltrate.  Micro Data:  Flu 4/28 > neg. COVID 4/28 > neg. Blood 4/28 >  RVP 4/28 >   Antimicrobials:  Vanc 4/28 > 4/29 Ceftriaxone 4/28 > 4/30 Azithromycin 4/28 >   Interim history/subjective:  Overnight had to have central replaced emergently as she pulled it out.  More acidotic and plcaed on bipap.   Objective   Blood pressure 119/69, pulse 89, temperature (!) 97.5 F (36.4 C), temperature source Axillary, resp. rate (!) 24, weight 134.7 kg, last menstrual period 02/19/2011, SpO2 95 %.    FiO2 (%):  [100 %] 100 %   Intake/Output Summary (Last 24 hours) at 05/05/2019 0943 Last data filed at 05/05/2019 0800 Gross per 24 hour  Intake 1653.24 ml  Output 1560 ml  Net 93.24 ml   Filed Weights   05/03/19 0451 05/04/19 0500 05/05/19 0428  Weight: 134.4 kg 135.9 kg 134.7 kg    Examination:  Gen: Morbidly obese female, on bipap. Diffusely volume overloaded Neck: JVD difficult to assess 2/2 body habitus, left neck central line Heart: RRR, S1, S2, no  M/R/G, no chest wall tenderness. Right upper chest hickman in place.  Lungs: Distant breath sounds;increased work of breathing Abdomen: distended, soft Extremities: no clubbing, cyanosis. bilateral lower extremities have 2+ edema to the knees; pulses are +2 in bilateral upper and lower extremities Skin: trunk face and upper extremities are flushed, venous stasis changes bilaterally;   warmth and erythema in R leg to the calf, tenderness Neuro: Not responsive to verbal stimuli. Does not follow commands.   Resolved Hospital Problem list   none  Assessment & Plan:  Acute on chronic hypoxic and hypercarbic respiratory failure: Likely multifactorial but incited by CAP/bacteremia in setting of severe underlying PAH. Blood cultures grew strep spp, as below. We will continue supplemental O2 as needed to maintain SpO2 > 88%.  - Continue Ceftriaxone  - PAH, CAP mgmt as below   Severe PAH  HFpEF with cor pulmonale - on Remodulin and sildenafil and followed by Dr. Haroldine Laws and Dr. Gilles Chiquito at [facility].  RHC from 06/27/18 with PA 54, PCW 12, PA sat 59% and 63%.Holding sildenafil given her hypotension. TTE on 4/29 showed decreased PA pressures likely 2/2 worsening RV function rather than true improvement. RA pressure 15, LVEF 55-60%. Home Hickman removed for bacteremia.  - will need to discuss PICC placement despite bacteremia. Currently receiving Remodulin through TLC port which is not ideal - she needs a dedicated line for this to avoid flushing - Heart failure consulted, appreciate recs.  - Continue home Remodulin - Hold sildenafil  - strict I/Os -on full dose AC with lovenox.  (on xarelto at home)   Strep G bacteremia  CAP  RLE cellulitis:  Will narrow to ceftriaxone today. Stop azithromycin and zosyn. Sepsis present on admission TTE did not show evidence of vegetations. Consulted ID on 4/30 will need TEE when stable.  - Continue CTX 2g qd and Azithromycin 500 mg qd  - ID consulted, appreciate recs - Remove tunneled line, will have to give via central line; IR contacted. Still does not have source control.   Mixed cardiogenic/septic shock- 2/2 strep bacteremia:  - Treatment of fluid status and bacteremia as above - On norepinephrine - will obtain co-ox and lactic acid - suspect she needs more diruesis given her physical exam and grossly volume overloaded state.  - lasix 80 mg  IV BID - she responded well to 80 but was still net positive  Elevated troponin  H/o HFpEF w/ cor pulmonale - 3208->2856; likely demand ischemia. Pro-BNP 441. Troponin now downtrending. Echo on 05/03/19 showed lower PA pressures from prior studies likely 2/2 worsening of RV function; LVEF 50-60% and no valvular disease.  - HF recs ASA however pt is allergic (airway swelling). Will not give ASA at this point    Paroxysmal Atrial Fib: Long h/o PAF on Xarelto and compliant at home. No bleeding signs/sx. Pt has not been in AF during this hospitalization.  - Lovenox in lieu of home Xarelto   FEN: Pt had severe hypokalemia to <2.7 on presentation, presumed 2/2 diarrhea. . - Most recent K is 3.4, replacing today.  - Daily BMP with repeat this afternoon  Type 2 Diabetes Mellitus: Non insulin dependent, on metformin.  - SSI. - Hold home metformin.  Acute metabolic encephalopathy - likely secondary to worsening hypercarbia - superimposed infection  Best practice:  Diet: NPO for respiratory status.  Pain/Anxiety/Delirium protocol (if indicated): N/A. VAP protocol (if indicated): N/A. DVT prophylaxis: SCD's / Lovenox. GI prophylaxis: N/A. Glucose control: SSI. Mobility: Bedrest. Code Status: Limited code -  no intubation but OK with CPR in the event of arrest. Family Communication: Son updated at bedside 5/1 Disposition: ICU.  The patient is critically ill with multiple organ systems failure and requires high complexity decision making for assessment and support, frequent evaluation and titration of therapies, application of advanced monitoring technologies and extensive interpretation of multiple databases.   Critical Care Time devoted to patient care services described in this note is 54 minutes. This time reflects time of care of this [place] . This critical care time does not reflect separately billable procedures or procedure time, teaching time or supervisory time of  PA/NP/Med student/Med Resident etc but could involve care discussion time.  [facility] 05/05/2019 9:43 AM  Pager: [phone] After hours pager: [phone]   Labs   CBC: Recent Labs  Lab 05/02/19 1953 05/03/19 0106 05/03/19 0450 05/03/19 0450 05/04/19 1022 05/04/19 1452 05/04/19 2154 05/05/19 0039 05/05/19 0514  WBC 20.0*  --  33.0*  --  45.5*  --   --   --   --   NEUTROABS 18.4*  --   --   --   --   --   --   --   --   HGB 14.1   < > 14.3   < > 13.7 15.6* 15.6* 15.6* 15.3*  HCT 44.8   < > 45.6   < > 43.4 46.0 46.0 46.0 45.0  MCV 88.2  --  87.9  --  90.2  --   --   --   --   PLT 412*  --  435*  --  295  --   --   --   --    < > = values in this interval not displayed.    Basic Metabolic Panel: Recent Labs  Lab 05/02/19 1953 05/03/19 0106 05/03/19 0450 05/03/19 0450 05/04/19 1022 05/04/19 1452 05/04/19 2154 05/05/19 0039 05/05/19 0514  NA 140   < > 139   < > 138 138 138 138 140  K 2.7*   < > 5.0   < > 3.3* 4.0 4.1 4.1 3.4*  CL 101  --  103  --  103  --   --   --   --   CO2 27  --  25  --  24  --   --   --   --   GLUCOSE 104*  --  132*  --  148*  --   --   --   --   BUN 12  --  18  --  30*  --   --   --   --   CREATININE 0.89  --  1.35*  --  1.26*  --   --   --   --   CALCIUM 8.8*  --  7.8*  --  7.5*  --   --   --   --   MG  --   --  1.5*  --  2.2  --   --   --   --   PHOS  --   --  3.8  --   --   --   --   --   --    < > = values in this interval not displayed.   GFR: Estimated Creatinine Clearance: 66.9 mL/min (A) (by C-G formula based on SCr of 1.26 mg/dL (H)). Recent Labs  Lab 05/02/19 1953 05/02/19 2140 05/03/19 0450 05/03/19  1506 05/04/19 1022  WBC 20.0*  --  33.0*  --  45.5*  LATICACIDVEN 3.1* 3.1*  --  2.7*  --     Liver Function Tests: Recent Labs  Lab 05/02/19 1953  AST 29  ALT 17  ALKPHOS 73  BILITOT 0.8  PROT 7.5  ALBUMIN 3.7   No results for input(s): LIPASE, AMYLASE in the last  168 hours. No results for input(s): AMMONIA in the last 168 hours.  ABG    Component Value Date/Time   PHART 7.216 (L) 05/05/2019 0514   PCO2ART 71.3 (HH) 05/05/2019 0514   PO2ART 79 (L) 05/05/2019 0514   HCO3 29.0 (H) 05/05/2019 0514   TCO2 31 05/05/2019 0514   ACIDBASEDEF 1.0 05/05/2019 0514   O2SAT 92.0 05/05/2019 0514     Coagulation Profile: Recent Labs  Lab 05/02/19 1953  INR 1.3*    Cardiac Enzymes: No results for input(s): CKTOTAL, CKMB, CKMBINDEX, TROPONINI in the last 168 hours.  HbA1C: Hemoglobin A1C  Date/Time Value Ref Range Status  04/09/2019 01:48 PM 5.5 4.0 - 5.6 % Final  07/11/2018 04:18 PM 6.1 (A) 4.0 - 5.6 % Final   Hgb A1c MFr Bld  Date/Time Value Ref Range Status  10/14/2010 10:41 AM 13.5 (H) <5.7 % Final    Comment:                                                                           According to the ADA Clinical Practice Recommendations for 2011, when HbA1c is used as a screening test:     >=6.5%   Diagnostic of Diabetes Mellitus            (if abnormal result is confirmed)   5.7-6.4%   Increased risk of developing Diabetes Mellitus   References:Diagnosis and Classification of Diabetes Mellitus,Diabetes SHUO,3729,02(XJDBZ 1):S62-S69 and Standards of Medical Care in         Diabetes - 2011,Diabetes MCEY,2233,61 (Suppl 1):S11-S61.    CBG: Recent Labs  Lab 05/04/19 1548 05/04/19 2004 05/05/19 0003 05/05/19 0357 05/05/19 0802  GLUCAP 106* 130* 147* 118* 130*

## 2019-05-05 NOTE — Progress Notes (Signed)
[facility] Progress Note Patient Name: [name] DOB: [birth year] MRN: [id number]   Date of Service  05/05/2019  HPI/Events of Note  Pt with agitated delirium on BIPAP, she's pulled out a central line and an arterial line tonight.  eICU Interventions  Precedex 0.2-0.6 mcg ordered.        Kerry Kass Kanon Novosel 05/05/2019, 4:54 AM

## 2019-05-05 NOTE — Progress Notes (Signed)
[facility] Progress Note Patient Name: [name] DOB: [birth year] MRN: [id number]   Date of Service  05/05/2019  HPI/Events of Note  Pt dislodged her arterial line.  eICU Interventions  Arterial line re-ordered.        Kerry Kass Matilda Fleig 05/05/2019, 4:01 AM

## 2019-05-06 DIAGNOSIS — I5033 Acute on chronic diastolic (congestive) heart failure: Secondary | ICD-10-CM | POA: Diagnosis not present

## 2019-05-06 DIAGNOSIS — J189 Pneumonia, unspecified organism: Secondary | ICD-10-CM | POA: Diagnosis not present

## 2019-05-06 DIAGNOSIS — R7881 Bacteremia: Secondary | ICD-10-CM | POA: Diagnosis not present

## 2019-05-06 DIAGNOSIS — J9621 Acute and chronic respiratory failure with hypoxia: Secondary | ICD-10-CM | POA: Diagnosis not present

## 2019-05-06 LAB — CBC
HCT: 38.4 % (ref 36.0–46.0)
Hemoglobin: 12 g/dL (ref 12.0–15.0)
MCH: 28.1 pg (ref 26.0–34.0)
MCHC: 31.3 g/dL (ref 30.0–36.0)
MCV: 89.9 fL (ref 80.0–100.0)
Platelets: 181 10*3/uL (ref 150–400)
RBC: 4.27 MIL/uL (ref 3.87–5.11)
RDW: 17.2 % — ABNORMAL HIGH (ref 11.5–15.5)
WBC: 37.9 10*3/uL — ABNORMAL HIGH (ref 4.0–10.5)
nRBC: 1.2 % — ABNORMAL HIGH (ref 0.0–0.2)

## 2019-05-06 LAB — BASIC METABOLIC PANEL
Anion gap: 7 (ref 5–15)
Anion gap: 11 (ref 5–15)
BUN: 38 mg/dL — ABNORMAL HIGH (ref 6–20)
BUN: 36 mg/dL — ABNORMAL HIGH (ref 6–20)
CO2: 30 mmol/L (ref 22–32)
CO2: 29 mmol/L (ref 22–32)
Calcium: 8.3 mg/dL — ABNORMAL LOW (ref 8.9–10.3)
Calcium: 8.4 mg/dL — ABNORMAL LOW (ref 8.9–10.3)
Chloride: 106 mmol/L (ref 98–111)
Chloride: 108 mmol/L (ref 98–111)
Creatinine, Ser: 1.32 mg/dL — ABNORMAL HIGH (ref 0.44–1.00)
Creatinine, Ser: 1.09 mg/dL — ABNORMAL HIGH (ref 0.44–1.00)
GFR calc Af Amer: 52 mL/min — ABNORMAL LOW (ref >60)
GFR calc Af Amer: >60 mL/min (ref >60)
GFR calc non Af Amer: 45 mL/min — ABNORMAL LOW (ref >60)
GFR calc non Af Amer: 56 mL/min — ABNORMAL LOW (ref >60)
Glucose, Bld: 126 mg/dL — ABNORMAL HIGH (ref 70–99)
Glucose, Bld: 104 mg/dL — ABNORMAL HIGH (ref 70–99)
Potassium: 3.3 mmol/L — ABNORMAL LOW (ref 3.5–5.1)
Potassium: 3.5 mmol/L (ref 3.5–5.1)
Sodium: 143 mmol/L (ref 135–145)
Sodium: 148 mmol/L — ABNORMAL HIGH (ref 135–145)

## 2019-05-06 LAB — GLUCOSE, CAPILLARY
Glucose-Capillary: 109 mg/dL — ABNORMAL HIGH (ref 70–99)
Glucose-Capillary: 112 mg/dL — ABNORMAL HIGH (ref 70–99)
Glucose-Capillary: 114 mg/dL — ABNORMAL HIGH (ref 70–99)
Glucose-Capillary: 120 mg/dL — ABNORMAL HIGH (ref 70–99)
Glucose-Capillary: 99 mg/dL (ref 70–99)
Glucose-Capillary: 99 mg/dL (ref 70–99)

## 2019-05-06 MED ORDER — ORAL CARE MOUTH RINSE
15.0000 mL | Freq: Two times a day (BID) | OROMUCOSAL | Status: DC
  Administered 2019-05-06 – 2019-05-24 (×35): 15 mL via OROMUCOSAL

## 2019-05-06 MED ORDER — POTASSIUM CHLORIDE 10 MEQ/100ML IV SOLN
10.0000 meq | INTRAVENOUS | Status: AC
  Administered 2019-05-06 – 2019-05-07 (×4): 10 meq via INTRAVENOUS
  Filled 2019-05-06 (×4): qty 100

## 2019-05-06 MED ORDER — POTASSIUM CHLORIDE 10 MEQ/50ML IV SOLN
10.0000 meq | INTRAVENOUS | Status: AC
  Administered 2019-05-06 (×6): 10 meq via INTRAVENOUS
  Filled 2019-05-06 (×6): qty 50

## 2019-05-06 NOTE — Progress Notes (Signed)
[facility] Progress Note Patient Name: [name] DOB: [birth year] MRN: [id number]   Date of Service  05/06/2019  HPI/Events of Note  K 3.5 from 3.3 after K replacement 60 meqs Ongoing diuresis. Creatinine improving  eICU Interventions  Ordered another K 40 meqs     Intervention Category Major Interventions: Electrolyte abnormality - evaluation and management  Shona Needles Ival Basquez 05/06/2019, 8:21 PM

## 2019-05-06 NOTE — Progress Notes (Signed)
Pt taken off Bipap at this time & placed on HFNC 30L/100% Pt awake & alert, tolerating well

## 2019-05-06 NOTE — Progress Notes (Signed)
Walked in to see two other nurses at pt bedside. Pt had pulled off BiPap and had attempted to pull out CVC. BiPap put back on, pt placed in wrist restraints, and MD notified.

## 2019-05-06 NOTE — Progress Notes (Signed)
NAME:  [name], MRN:  [id number], DOB:  03/19/1960, LOS: 4 ADMISSION DATE:  05/02/2019, CONSULTATION DATE:  05/02/19 REFERRING MD:  Laverta Baltimore, CHIEF COMPLAINT:  Dypsnea   Brief History   [name] is a 59 y.o. female with a pertinent PMH of PAH on Remodulin and Sildenafil (Mean PAP 54, PCW 12), OSA, morbid obesity, DM2, CKD, who presented with fever and productive cough and was admitted with sepsis 2/2 CAP and possible RLE cellulitis along with acute on chronic hypoxic respiratory failure.   History of present illness   [name] is a 59 y.o. female who has a PMH including but not limited to [place] on treprostinol, CHF, PAF on xarelto, Morbid obesity (see "past medical history" for rest).  She presented to [facility] 4/28 with fever and productive cough with associated diarrhea and generalized weakness.  She states diarrhea has occurred "for a few days" but over the past 1 - 2 days, she also began to develop chills and generalized weakness.  Denies chest pain, N/V, abd pain, myalgias.  In ED, she was found to be hypoxic to the 80's despite her baseline of 4L O2.  She was increased to 8L with improvement in sats to low 90's.  She was also hypotensive to the 70's SBP.  She was started on gentle IVF hydration given her hx of PAH and current respiratory failure.  CXR showed possible retrocardiac infiltrate and she was febrile to 103F. She was also found to have findings c/w RLE cellulitis (has venous stasis change to BLE's; however, RLE warmer compared to LLE).  PCCM called for admission.  Past Medical History  has ANEMIA, B12 DEFICIENCY; Obstructive sleep apnea; GERD; Obesity; Diabetes ([place]); Hyperlipidemia; Allergic rhinitis; Right heart failure, NYHA class 3 ([place]); Pulmonary hypertension ([place]); Atrial flutter ([place]); Acute on chronic diastolic (congestive) heart failure ([place]); Peripheral neuropathy ([place]); Healthcare maintenance; Rash; Postmenopausal vaginal bleeding; Lower extremity edema;  Ovarian cyst; Hematoma of arm, left, subsequent encounter; Abdominal muscle strain, initial encounter; Acute on chronic respiratory failure ([place]); CAP (community acquired pneumonia); Atelectasis; Urinary incontinence; Bleeding hemorrhoid; and Vaginal itching on their problem list.  [place] Events   4/28 > admit started on HFNC and transitioned to NIPPV 2/2 to desaturation   Consults:  Heart Failure  Procedures:  4/28 Art line pending> 4/29: CVC >> pulled out 4/30   Significant Diagnostic Tests:  4/28 CXR > possible retrocardiac infiltrate.  Micro Data:  Flu 4/28 > neg. COVID 4/28 > neg. Blood 4/28 >  RVP 4/28 >   Antimicrobials:  Vanc 4/28 > 4/29 Ceftriaxone 4/28 > 4/30 Azithromycin 4/28 >   Interim history/subjective:  Overnight no issues. Diuresed with iv lasix. Off pressors and precedex this morning  Objective   Blood pressure 108/62, pulse 90, temperature 97.6 F (36.4 C), temperature source Axillary, resp. rate (!) 24, weight 134.7 kg, last menstrual period 02/19/2011, SpO2 95 %.    FiO2 (%):  [90 %-100 %] 100 %   Intake/Output Summary (Last 24 hours) at 05/06/2019 1238 Last data filed at 05/06/2019 1200 Gross per 24 hour  Intake 1318.65 ml  Output 2340 ml  Net -1021.35 ml   Filed Weights   05/03/19 0451 05/04/19 0500 05/05/19 0428  Weight: 134.4 kg 135.9 kg 134.7 kg    Examination: Gen: Morbidly obese female, on bipap. Diffusely volume overloaded Neck: JVD difficult to assess 2/2 body habitus, left neck central line Heart: RRR, S1, S2, no M/R/G, no chest wall tenderness.  Lungs: Distant  breath sounds;increased work of breathing Abdomen: distended, soft Extremities: no clubbing, cyanosis. bilateral lower extremities have 2+ edema to the knees; pulses are +2 in bilateral upper and lower extremities Skin: trunk face and upper extremities are flushed, venous stasis changes bilaterally;  warmth and erythema in R leg to the calf, tenderness Neuro:  somnolent but arousable to verbal stimuli and follows commands  Resolved Hospital Problem list   none  Assessment & Plan:  Acute on chronic hypoxic and hypercarbic respiratory failure: Likely multifactorial but incited by CAP/bacteremia in setting of severe underlying PAH. Blood cultures grew strep spp, as below. We will continue supplemental O2 as needed to maintain SpO2 > 88%.  - Continue Ceftriaxone  - PAH, CAP mgmt as below   Severe PAH  HFpEF with cor pulmonale - on Remodulin and sildenafil and followed by Dr. Haroldine Laws and Dr. Gilles Chiquito at [facility].  RHC from 06/27/18 with PA 54, PCW 12, PA sat 59% and 63%.Holding sildenafil given her hypotension. TTE on 4/29 showed decreased PA pressures likely 2/2 worsening RV function rather than true improvement. RA pressure 15, LVEF 55-60%. Home Hickman removed for bacteremia.  - will repeat blood cultures. Will need alternate port placement for remodulin prior to discharge. Continue through central line for now.  - Heart failure consulted, appreciate recs.  - Continue home Remodulin - Hold sildenafil  - strict I/Os -on full dose AC with lovenox.  (on xarelto at home)   Strep G bacteremia  CAP  RLE cellulitis:  Will narrow to ceftriaxone today. Stop azithromycin and zosyn. Sepsis present on admission TTE did not show evidence of vegetations. Consulted ID on 4/30 will need TEE when stable.  - Continue CTX 2g qd and Azithromycin 500 mg qd  - ID consulted, appreciate recs  Mixed cardiogenic/septic shock- 2/2 strep bacteremia:  - Treatment of fluid status and bacteremia as above - off norepinephrine for now - will obtain co-ox and lactic acid - suspect she needs more diruesis given her physical exam and grossly volume overloaded state.  - continue lasix  Elevated troponin  H/o HFpEF w/ cor pulmonale - 3208->2856; likely demand ischemia. Pro-BNP 441. Troponin now downtrending. Echo on 05/03/19 showed lower PA pressures from prior studies likely 2/2  worsening of RV function; LVEF 50-60% and no valvular disease.  - HF recs ASA however pt is allergic (airway swelling). Will not give ASA at this point    Paroxysmal Atrial Fib: Long h/o PAF on Xarelto and compliant at home. No bleeding signs/sx. Pt has not been in AF during this hospitalization.  - Lovenox in lieu of home Xarelto   FEN: Pt had severe hypokalemia to <2.7 on presentation, presumed 2/2 diarrhea. . - Most recent K is 3.3, replacing today peripherally. No enteral access. - Daily BMP with repeat this afternoon  Type 2 Diabetes Mellitus: Non insulin dependent, on metformin.  - SSI. - Hold home metformin.  Acute metabolic encephalopathy - likely secondary to worsening hypercarbia - superimposed infection  Best practice:  Diet: NPO for respiratory status.  Pain/Anxiety/Delirium protocol (if indicated): N/A. VAP protocol (if indicated): N/A. DVT prophylaxis: SCD's / Lovenox. GI prophylaxis: N/A. Glucose control: SSI. Mobility: Bedrest. Code Status: Limited code - no intubation but OK with CPR in the event of arrest. Family Communication: Son updated at bedside 5/1. Slightly improved from yesterday in that she's off pressors but still very critically ill.  Disposition: ICU.  The patient is critically ill with multiple organ systems failure and requires high complexity decision  making for assessment and support, frequent evaluation and titration of therapies, application of advanced monitoring technologies and extensive interpretation of multiple databases.   Critical Care Time devoted to patient care services described in this note is 37 minutes. This time reflects time of care of this [place] . This critical care time does not reflect separately billable procedures or procedure time, teaching time or supervisory time of PA/NP/Med student/Med Resident etc but could involve care discussion time.  [facility]  [facility] 05/05/2019 9:43 AM  Pager: [phone] After hours pager: [phone]   Labs   CBC: Recent Labs  Lab 05/02/19 1953 05/03/19 0106 05/03/19 0450 05/03/19 0450 05/04/19 1022 05/04/19 1452 05/04/19 2154 05/05/19 0039 05/05/19 0514 05/05/19 1021 05/06/19 0804  WBC 20.0*  --  33.0*  --  45.5*  --   --   --   --  45.3* 37.9*  NEUTROABS 18.4*  --   --   --   --   --   --   --   --   --   --   HGB 14.1   < > 14.3   < > 13.7   < > 15.6* 15.6* 15.3* 12.7 12.0  HCT 44.8   < > 45.6   < > 43.4   < > 46.0 46.0 45.0 41.2 38.4  MCV 88.2  --  87.9  --  90.2  --   --   --   --  91.8 89.9  PLT 412*  --  435*  --  295  --   --   --   --  227 181   < > = values in this interval not displayed.    Basic Metabolic Panel: Recent Labs  Lab 05/03/19 0450 05/03/19 0450 05/04/19 1022 05/04/19 1452 05/05/19 0039 05/05/19 0514 05/05/19 1021 05/05/19 1544 05/06/19 0804  NA 139   < > 138   < > 138 140 140 140 143  K 5.0   < > 3.3*   < > 4.1 3.4* 3.5 3.9 3.3*  CL 103  --  103  --   --   --  103 104 106  CO2 25  --  24  --   --   --  _0 GLUCOSE 132*  --  148*  --   --   --  158* 133* 126*  BUN 18  --  30*  --   --   --  39* 41* 38*  CREATININE 1.35*  --  1.26*  --   --   --  1.50* 1.54* 1.32*  CALCIUM 7.8*  --  7.5*  --   --   --  8.0* 7.8* 8.3*  MG 1.5*  --  2.2  --   --   --   --   --   --   PHOS 3.8  --   --   --   --   --   --   --   --    < > = values in this interval not displayed.   GFR: Estimated Creatinine Clearance: 63.8 mL/min (A) (by C-G formula based on SCr of 1.32 mg/dL (H)). Recent Labs  Lab 05/02/19 1953 05/02/19 2140 05/03/19 0450 05/03/19 1506 05/04/19 1022 05/05/19 1021 05/05/19 1232 05/06/19 0804  WBC   < >  --  33.0*  --  45.5* 45.3*  --  37.9*  LATICACIDVEN  --  3.1*  --  2.7*  --  1.2 0.9  --    < > = values in this interval not displayed.    Liver Function Tests: Recent Labs  Lab 05/02/19 1953  AST 29  ALT 17  ALKPHOS 73  BILITOT 0.8   PROT 7.5  ALBUMIN 3.7   No results for input(s): LIPASE, AMYLASE in the last 168 hours. No results for input(s): AMMONIA in the last 168 hours.  ABG    Component Value Date/Time   PHART 7.216 (L) 05/05/2019 0514   PCO2ART 71.3 (HH) 05/05/2019 0514   PO2ART 79 (L) 05/05/2019 0514   HCO3 29.0 (H) 05/05/2019 0514   TCO2 31 05/05/2019 0514   ACIDBASEDEF 1.0 05/05/2019 0514   O2SAT 76.9 05/05/2019 1021     Coagulation Profile: Recent Labs  Lab 05/02/19 1953  INR 1.3*    Cardiac Enzymes: No results for input(s): CKTOTAL, CKMB, CKMBINDEX, TROPONINI in the last 168 hours.  HbA1C: Hemoglobin A1C  Date/Time Value Ref Range Status  04/09/2019 01:48 PM 5.5 4.0 - 5.6 % Final  07/11/2018 04:18 PM 6.1 (A) 4.0 - 5.6 % Final   Hgb A1c MFr Bld  Date/Time Value Ref Range Status  10/14/2010 10:41 AM 13.5 (H) <5.7 % Final    Comment:                                                                           According to the ADA Clinical Practice Recommendations for 2011, when HbA1c is used as a screening test:     >=6.5%   Diagnostic of Diabetes Mellitus            (if abnormal result is confirmed)   5.7-6.4%   Increased risk of developing Diabetes Mellitus   References:Diagnosis and Classification of Diabetes Mellitus,Diabetes QLRJ,7366,81(PTELM 1):S62-S69 and Standards of Medical Care in         Diabetes - 2011,Diabetes RAJH,1834,37 (Suppl 1):S11-S61.    CBG: Recent Labs  Lab 05/05/19 2019 05/06/19 0001 05/06/19 0441 05/06/19 0751 05/06/19 1152  GLUCAP 109* 112* 114* 120* 109*

## 2019-05-07 ENCOUNTER — Inpatient Hospital Stay (HOSPITAL_COMMUNITY): Payer: Medicaid Other

## 2019-05-07 DIAGNOSIS — L03115 Cellulitis of right lower limb: Secondary | ICD-10-CM | POA: Diagnosis not present

## 2019-05-07 DIAGNOSIS — I878 Other specified disorders of veins: Secondary | ICD-10-CM | POA: Diagnosis present

## 2019-05-07 DIAGNOSIS — R579 Shock, unspecified: Secondary | ICD-10-CM

## 2019-05-07 DIAGNOSIS — I5033 Acute on chronic diastolic (congestive) heart failure: Secondary | ICD-10-CM | POA: Diagnosis not present

## 2019-05-07 DIAGNOSIS — J189 Pneumonia, unspecified organism: Secondary | ICD-10-CM | POA: Diagnosis not present

## 2019-05-07 DIAGNOSIS — R7881 Bacteremia: Secondary | ICD-10-CM | POA: Diagnosis present

## 2019-05-07 DIAGNOSIS — J9621 Acute and chronic respiratory failure with hypoxia: Secondary | ICD-10-CM | POA: Diagnosis not present

## 2019-05-07 DIAGNOSIS — B955 Unspecified streptococcus as the cause of diseases classified elsewhere: Secondary | ICD-10-CM | POA: Diagnosis present

## 2019-05-07 DIAGNOSIS — I2721 Secondary pulmonary arterial hypertension: Secondary | ICD-10-CM | POA: Diagnosis not present

## 2019-05-07 LAB — BRAIN NATRIURETIC PEPTIDE: B Natriuretic Peptide: 325.9 pg/mL — ABNORMAL HIGH (ref 0.0–100.0)

## 2019-05-07 LAB — GLUCOSE, CAPILLARY
Glucose-Capillary: 73 mg/dL (ref 70–99)
Glucose-Capillary: 85 mg/dL (ref 70–99)
Glucose-Capillary: 88 mg/dL (ref 70–99)
Glucose-Capillary: 90 mg/dL (ref 70–99)
Glucose-Capillary: 91 mg/dL (ref 70–99)
Glucose-Capillary: 93 mg/dL (ref 70–99)
Glucose-Capillary: 94 mg/dL (ref 70–99)

## 2019-05-07 LAB — HEPARIN ANTI-XA: Heparin LMW: 0.61 IU/mL

## 2019-05-07 MED ORDER — ADENOSINE 6 MG/2ML IV SOLN
INTRAVENOUS | Status: AC
  Filled 2019-05-07: qty 2

## 2019-05-07 NOTE — Consult Note (Signed)
[facility]    Date of Admission:  05/02/2019   Total days of antibiotics 7        Day 6 ceftriaxone              Reason for Consult: Right leg cellulitis complicated by streptococcal bacteremia    Referring Provider: Dr. Pierre Bali  Assessment: She has right leg cellulitis complicated by streptococcal bacteremia.  She defervesced promptly on antibiotic therapy. The skin changes of cellulitis frequently get worse over the first few weeks even when the infection is well controlled.  This is much like a thermal burn.  I believe that she is improving.  Plan: 1. 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 Chlorhexidine Gluconate Cloth  6 each Topical Daily  . enoxaparin (LOVENOX) injection  130 mg Subcutaneous Q12H  . ferrous sulfate  325 mg Oral Q breakfast  . furosemide  80 mg Intravenous BID  . gabapentin  600 mg Oral BID  . insulin aspart  0-20 Units Subcutaneous Q4H  . mouth rinse  15 mL Mouth Rinse BID  . pantoprazole (PROTONIX) IV  40 mg Intravenous QHS  . pravastatin  20 mg Oral Daily  . vitamin B-12  500 mcg Oral Daily   Continuous Infusions: . sodium chloride Stopped (05/07/19 0008)  . cefTRIAXone (ROCEPHIN)  IV Stopped (05/06/19 1203)  . dexmedetomidine (PRECEDEX) IV infusion 0.2 mcg/kg/hr (05/07/19 0300)  . norepinephrine (LEVOPHED) Adult infusion Stopped (05/06/19 0802)  . treprostinil (REMODULIN) infusion 81 ng/kg/min (05/06/19 2127)   PRN Meds:.docusate sodium, ondansetron (ZOFRAN) IV, polyethylene  glycol  HPI: [name] is a 59 y.o. female with longstanding pulmonary hypertension and obstructive sleep chronic IV Remodulin since 2014.  She has chronic venous stasis.  She was admitted on 05/02/2019 with fever leg cellulitis.  There is also concern for possible pneumonia.  Initially treated with vancomycin, cefepime and metronidazole to azithromycin and ceftriaxone.  She defervesced promptly.  Azithromycin was stopped yesterday.  I was consulted today because of concerns about possible worsening of her cellulitis.   Review of Systems: Review of Systems  Unable to perform ROS: Mental acuity    Past Medical History:  Diagnosis Date  . Anemia, iron deficiency    secondary to menhorrhagia, on oral iron, also b12 def, getting monthly b12 shots  . CHF (congestive heart failure) ([place])   . Chronic cough    secondary to alleriges and post nasal drip  . Cognitive impairment   . COPD (chronic obstructive pulmonary disease) ([place])   . Cor pulmonale (HCC)    PA Peak pressure 4mHg  . Depression   . Diabetes mellitus    well controlled on metformin  . Diastolic heart failure ([place]   . GERD (gastroesophageal reflux disease)   . H/O mental retardation   . Herpes   . Hyperlipidemia   . Hypertension   . OSA (obstructive sleep apnea)    CPAP  . Pulmonary hypertension ([place]   . Renal disorder   .  Restrictive lung disease    PFTs 06/2012 (FVC 54% predicted and FEV1 68% predicted w minimal bronchodilator response).  . Shortness of breath   . Venous stasis ulcer ([place])    chornic, ?followed up at wound care center, multiple courses of antibiotics in past for cellulitis, on lasix    Social History   Tobacco Use  . Smoking status: Never Smoker  . Smokeless tobacco: Never Used  Substance Use Topics  . Alcohol use: No    Alcohol/week: 0.0 standard drinks  . Drug use: No    Family History  Problem Relation Age of Onset  . Kidney Stones Son   . Mental illness Sister   . Bipolar  disorder Sister   . Mental retardation Brother   . Hyperlipidemia Mother   . Breast cancer Maternal Aunt    Allergies  Allergen Reactions  . Aspirin     REACTION: airway swelling  . Codeine     REACTION: tingling in lips and hard breathing - had reaction at dentist - states "I can't take certain kinds of codeine" - happened maybe 10 yr ago  . Lisinopril     Cough  . Sulfonamide Derivatives     REACTION: airway swelling  . Latex Rash    OBJECTIVE: Blood pressure (!) 85/67, pulse 80, temperature 98.1 F (36.7 C), temperature source Oral, resp. rate (!) 24, weight 134.7 kg, last menstrual period 02/19/2011, SpO2 95 %.  Physical Exam Constitutional:      Comments: She was sleeping soundly.  She is wearing her CPAP mask.  He does not wake up and respond to questions.  Cardiovascular:     Rate and Rhythm: Normal rate and regular rhythm.     Heart sounds: No murmur.  Pulmonary:     Effort: Pulmonary effort is normal.     Breath sounds: No wheezing, rhonchi or rales.  Abdominal:     General: There is distension.     Tenderness: There is no abdominal tenderness.  Musculoskeletal:     Right lower leg: Edema present.     Left lower leg: Edema present.  Skin:    Comments: Venous stasis dermatitis in left leg.  Diffuse erythema from right mid thigh to foot.     Lab Results Lab Results  Component Value Date   WBC 37.9 (H) 05/06/2019   HGB 12.0 05/06/2019   HCT 38.4 05/06/2019   MCV 89.9 05/06/2019   PLT 181 05/06/2019    Lab Results  Component Value Date   CREATININE 1.09 (H) 05/06/2019   BUN 36 (H) 05/06/2019   NA 148 (H) 05/06/2019   K 3.5 05/06/2019   CL 108 05/06/2019   CO2 29 05/06/2019    Lab Results  Component Value Date   ALT 17 05/02/2019   AST 29 05/02/2019   ALKPHOS 73 05/02/2019   BILITOT 0.8 05/02/2019     Microbiology: Recent Results (from the past 240 hour(s))  Culture, blood (Routine x 2)     Status: Abnormal   Collection Time: 05/02/19  7:30  PM   Specimen: BLOOD LEFT ARM  Result Value Ref Range Status   Specimen Description BLOOD LEFT ARM  Final   Special Requests   Final    BOTTLES DRAWN AEROBIC AND ANAEROBIC Blood Culture adequate volume   Culture  Setup Time   Final    GRAM POSITIVE COCCI IN CHAINS IN BOTH AEROBIC AND ANAEROBIC BOTTLES CRITICAL RESULT CALLED TO, READ BACK BY AND VERIFIED WITH: Criss Rosales PharmD  10:30 05/03/19 ([email or web address]) Performed at [facility], [place] [address], [place], [place] [address]    Culture STREPTOCOCCUS GROUP G (A)  Final   Report Status 05/05/2019 FINAL  Final   Organism ID, Bacteria STREPTOCOCCUS GROUP G  Final      Susceptibility   Streptococcus group g - MIC*    CLINDAMYCIN <=0.25 SENSITIVE Sensitive     AMPICILLIN <=0.25 SENSITIVE Sensitive     ERYTHROMYCIN <=0.12 SENSITIVE Sensitive     VANCOMYCIN 0.5 SENSITIVE Sensitive     CEFTRIAXONE <=0.12 SENSITIVE Sensitive     LEVOFLOXACIN 0.5 SENSITIVE Sensitive     PENICILLIN Value in next row Sensitive      SENSITIVE0.06    * STREPTOCOCCUS GROUP G  Blood Culture ID Panel (Reflexed)     Status: Abnormal   Collection Time: 05/02/19  7:30 PM  Result Value Ref Range Status   Enterococcus species NOT DETECTED NOT DETECTED Final   Listeria monocytogenes NOT DETECTED NOT DETECTED Final   Staphylococcus species NOT DETECTED NOT DETECTED Final   Staphylococcus aureus (BCID) NOT DETECTED NOT DETECTED Final   Streptococcus species DETECTED (A) NOT DETECTED Final    Comment: Not Enterococcus species, Streptococcus agalactiae, Streptococcus pyogenes, or Streptococcus pneumoniae. CRITICAL RESULT CALLED TO, READ BACK BY AND VERIFIED WITH: Criss Rosales PharmD 10:30 05/03/19 ([email or web address])    Streptococcus agalactiae NOT DETECTED NOT DETECTED Final   Streptococcus pneumoniae NOT DETECTED NOT DETECTED Final   Streptococcus pyogenes NOT DETECTED NOT DETECTED Final   Acinetobacter baumannii NOT DETECTED NOT DETECTED Final   Enterobacteriaceae species NOT DETECTED  NOT DETECTED Final   Enterobacter cloacae complex NOT DETECTED NOT DETECTED Final   Escherichia coli NOT DETECTED NOT DETECTED Final   Klebsiella oxytoca NOT DETECTED NOT DETECTED Final   Klebsiella pneumoniae NOT DETECTED NOT DETECTED Final   Proteus species NOT DETECTED NOT DETECTED Final   Serratia marcescens NOT DETECTED NOT DETECTED Final   Haemophilus influenzae NOT DETECTED NOT DETECTED Final   Neisseria meningitidis NOT DETECTED NOT DETECTED Final   Pseudomonas aeruginosa NOT DETECTED NOT DETECTED Final   Candida albicans NOT DETECTED NOT DETECTED Final   Candida glabrata NOT DETECTED NOT DETECTED Final   Candida krusei NOT DETECTED NOT DETECTED Final   Candida parapsilosis NOT DETECTED NOT DETECTED Final   Candida tropicalis NOT DETECTED NOT DETECTED Final    Comment: Performed at [facility], [place] [address], [place], [place] [address]  Culture, blood (Routine x 2)     Status: Abnormal   Collection Time: 05/02/19  7:49 PM   Specimen: BLOOD LEFT HAND  Result Value Ref Range Status   Specimen Description BLOOD LEFT HAND  Final   Special Requests   Final    BOTTLES DRAWN AEROBIC ONLY Blood Culture adequate volume   Culture  Setup Time   Final    GRAM POSITIVE COCCI IN PAIRS AND CHAINS AEROBIC BOTTLE ONLY CRITICAL VALUE NOTED.  VALUE IS CONSISTENT WITH PREVIOUSLY REPORTED AND CALLED VALUE.    Culture (A)  Final    STREPTOCOCCUS GROUP G SUSCEPTIBILITIES PERFORMED ON PREVIOUS CULTURE WITHIN THE LAST 5 DAYS. Performed at [facility], [place] [address] [place], [place] [address]    Report Status 05/05/2019 FINAL  Final  Respiratory Panel by RT PCR (Flu A&B, Covid) - Nasopharyngeal Swab     Status: None   Collection Time: 05/02/19  9:11 PM   Specimen: Nasopharyngeal Swab  Result Value Ref Range Status   SARS Coronavirus  2 by RT PCR NEGATIVE NEGATIVE Final    Comment: (NOTE) SARS-CoV-2 target nucleic acids are NOT DETECTED. The SARS-CoV-2 RNA is generally  detectable in upper respiratoy specimens during the acute phase of infection. The lowest concentration of SARS-CoV-2 viral copies this assay can detect is 131 copies/mL. A negative result does not preclude SARS-Cov-2 infection and should not be used as the sole basis for treatment or other patient management decisions. A negative result may occur with  improper specimen collection/handling, submission of specimen other than nasopharyngeal swab, presence of viral mutation(s) within the areas targeted by this assay, and inadequate number of viral copies (<131 copies/mL). A negative result must be combined with clinical observations, patient history, and epidemiological information. The expected result is Negative. Fact Sheet for Patients:  PinkCheek.be Fact Sheet for Healthcare Providers:  GravelBags.it This test is not yet ap proved or cleared by the [place] FDA and  has been authorized for detection and/or diagnosis of SARS-CoV-2 by FDA under an Emergency Use Authorization (EUA). This EUA will remain  in effect (meaning this test can be used) for the duration of the COVID-19 declaration under Section 564(b)(1) of the Act, 21 U.S.C. section 360bbb-3(b)(1), unless the authorization is terminated or revoked sooner.    Influenza A by PCR NEGATIVE NEGATIVE Final   Influenza B by PCR NEGATIVE NEGATIVE Final    Comment: (NOTE) The Xpert Xpress SARS-CoV-2/FLU/RSV assay is intended as an aid in  the diagnosis of influenza from Nasopharyngeal swab specimens and  should not be used as a sole basis for treatment. Nasal washings and  aspirates are unacceptable for Xpert Xpress SARS-CoV-2/FLU/RSV  testing. Fact Sheet for Patients: PinkCheek.be Fact Sheet for Healthcare Providers: GravelBags.it This test is not yet approved or cleared by the [place] FDA and  has been  authorized for detection and/or diagnosis of SARS-CoV-2 by  FDA under an Emergency Use Authorization (EUA). This EUA will remain  in effect (meaning this test can be used) for the duration of the  Covid-19 declaration under Section 564(b)(1) of the Act, 21  U.S.C. section 360bbb-3(b)(1), unless the authorization is  terminated or revoked. Performed at [facility], [place] [address] [place], [place] [address]   MRSA PCR Screening     Status: None   Collection Time: 05/03/19  4:41 AM   Specimen: Nasopharyngeal  Result Value Ref Range Status   MRSA by PCR NEGATIVE NEGATIVE Final    Comment:        The GeneXpert MRSA Assay (FDA approved for NASAL specimens only), is one component of a comprehensive MRSA colonization surveillance program. It is not intended to diagnose MRSA infection nor to guide or monitor treatment for MRSA infections. Performed at [facility], [place] [address], [place], [place] [address]   C Difficile Quick Screen w PCR reflex     Status: None   Collection Time: 05/03/19 12:43 PM   Specimen: STOOL  Result Value Ref Range Status   C Diff antigen NEGATIVE NEGATIVE Final   C Diff toxin NEGATIVE NEGATIVE Final   C Diff interpretation No C. difficile detected.  Final    Comment: Performed at [facility], [place] [address] [place], [place] [address]  Gastrointestinal Panel by PCR , Stool     Status: None   Collection Time: 05/03/19 12:43 PM   Specimen: STOOL  Result Value Ref Range Status   Campylobacter species NOT DETECTED NOT DETECTED Final   Plesimonas shigelloides NOT DETECTED NOT  DETECTED Final   Salmonella species NOT DETECTED NOT DETECTED Final   Yersinia enterocolitica NOT DETECTED NOT DETECTED Final   Vibrio species NOT DETECTED NOT DETECTED Final   Vibrio cholerae NOT DETECTED NOT DETECTED Final   Enteroaggregative E coli (EAEC) NOT DETECTED NOT DETECTED Final   Enteropathogenic E coli (EPEC) NOT DETECTED NOT DETECTED Final    Enterotoxigenic E coli (ETEC) NOT DETECTED NOT DETECTED Final   Shiga like toxin producing E coli (STEC) NOT DETECTED NOT DETECTED Final   Shigella/Enteroinvasive E coli (EIEC) NOT DETECTED NOT DETECTED Final   Cryptosporidium NOT DETECTED NOT DETECTED Final   Cyclospora cayetanensis NOT DETECTED NOT DETECTED Final   Entamoeba histolytica NOT DETECTED NOT DETECTED Final   Giardia lamblia NOT DETECTED NOT DETECTED Final   Adenovirus F40/41 NOT DETECTED NOT DETECTED Final   Astrovirus NOT DETECTED NOT DETECTED Final   Norovirus GI/GII NOT DETECTED NOT DETECTED Final   Rotavirus A NOT DETECTED NOT DETECTED Final   Sapovirus (I, II, IV, and V) NOT DETECTED NOT DETECTED Final    Comment: Performed at [facility], [place] [place], [place] [address]  Respiratory Panel by PCR     Status: None   Collection Time: 05/03/19  1:40 PM   Specimen: Nasopharyngeal Swab; Respiratory  Result Value Ref Range Status   Adenovirus NOT DETECTED NOT DETECTED Final   Coronavirus 229E NOT DETECTED NOT DETECTED Final    Comment: (NOTE) The Coronavirus on the Respiratory Panel, DOES NOT test for the novel  Coronavirus (2019 nCoV)    Coronavirus HKU1 NOT DETECTED NOT DETECTED Final   Coronavirus NL63 NOT DETECTED NOT DETECTED Final   Coronavirus OC43 NOT DETECTED NOT DETECTED Final   Metapneumovirus NOT DETECTED NOT DETECTED Final   Rhinovirus / Enterovirus NOT DETECTED NOT DETECTED Final   Influenza A NOT DETECTED NOT DETECTED Final   Influenza B NOT DETECTED NOT DETECTED Final   Parainfluenza Virus 1 NOT DETECTED NOT DETECTED Final   Parainfluenza Virus 2 NOT DETECTED NOT DETECTED Final   Parainfluenza Virus 3 NOT DETECTED NOT DETECTED Final   Parainfluenza Virus 4 NOT DETECTED NOT DETECTED Final   Respiratory Syncytial Virus NOT DETECTED NOT DETECTED Final   Bordetella pertussis NOT DETECTED NOT DETECTED Final   Chlamydophila pneumoniae NOT DETECTED NOT DETECTED Final   Mycoplasma  pneumoniae NOT DETECTED NOT DETECTED Final    Comment: Performed at [facility], [place]. [address], [place], [place] [address]  Culture, blood (routine x 2)     Status: None (Preliminary result)   Collection Time: 05/06/19 11:50 AM   Specimen: BLOOD  Result Value Ref Range Status   Specimen Description BLOOD RIGHT ANTECUBITAL  Final   Special Requests   Final    BOTTLES DRAWN AEROBIC ONLY Blood Culture results may not be optimal due to an inadequate volume of blood received in culture bottles   Culture   Final    NO GROWTH < 24 HOURS Performed at [facility], [place] [address] [place],  [address]    Report Status PENDING  Incomplete  Culture, blood (routine x 2)     Status: None (Preliminary result)   Collection Time: 05/06/19 11:57 AM   Specimen: BLOOD RIGHT HAND  Result Value Ref Range Status   Specimen Description BLOOD RIGHT HAND  Final   Special Requests   Final    BOTTLES DRAWN AEROBIC AND ANAEROBIC Blood Culture adequate volume   Culture   Final  NO GROWTH < 24 HOURS Performed at [place] [address] [place], [place] [address]    Report Status PENDING  Incomplete    Michel Bickers, MD [facility] [place] [phone] pager   [phone] cell 05/07/2019, 11:43 AM

## 2019-05-07 NOTE — Progress Notes (Addendum)
Advanced Heart Failure Rounding Note   Subjective:    [name] is a 59 year old with history of morbid obesity, cognitive impairment, DMII, PAF, OSA, cor pulmonale with severe pulmonary HTN on IV Remodulin  since 2014, admitted for septic shock/bacteremia in the setting of CAP and LE cellulitis.   BloodCultures + for Strep Group G. TEE not done yet. Remains on abx. AF. WBC trending down 45>>38K. On BiPAP at night. HF  during daytime, 15 L.    Diuresing w/ IV Lasix. SCr stable.   Off NE.   Feels tired.   Objective:   Weight Range:  Vital Signs:   Temp:  [97.6 F (36.4 C)-98.1 F (36.7 C)] 98.1 F (36.7 C) (05/03 0427) Pulse Rate:  [75-100] 75 (05/03 0844) Resp:  [15-25] 24 (05/03 0844) BP: (82-119)/(45-81) 85/67 (05/03 0500) SpO2:  [92 %-100 %] 96 % (05/03 0925) FiO2 (%):  [75 %-100 %] 100 % (05/03 0925) Last BM Date: 05/05/19  Weight change: Filed Weights   05/03/19 0451 05/04/19 0500 05/05/19 0428  Weight: 134.4 kg 135.9 kg 134.7 kg    Intake/Output:   Intake/Output Summary (Last 24 hours) at 05/07/2019 0939 Last data filed at 05/07/2019 0300 Gross per 24 hour  Intake 603.36 ml  Output 1900 ml  Net -1296.64 ml     Physical Exam: General:  Chronically ill appearing, super morbidly obese WF. HEENT: normal Neck: supple. Thick neck, difficult to assess JVD. Carotids 2+ bilat; no bruits. No lymphadenopathy or thryomegaly appreciated. + LIJ CVC Cor: PMI nondisplaced. RRR. No murmurs rubs or gallops   Lungs: coarse, no wheezing  Abdomen: obese soft, nontender, nondistended. No hepatosplenomegaly. No bruits or masses. Good bowel sounds. Extremities: no cyanosis, clubbing, rash . Obese extremities w/ Swevere bullous erythema R>LLE 1+ edema Neuro: alert & orientedx3, cranial nerves grossly intact. moves all 4 extremities w/o difficulty. Affect pleasant  Telemetry: NSR 90s Personally reviewed   Labs: Basic Metabolic Panel: Recent Labs  Lab 05/03/19 0450  05/03/19 0450 05/04/19 1022 05/04/19 1022 05/04/19 1452 05/05/19 0514 05/05/19 1021 05/05/19 1021 05/05/19 1544 05/06/19 0804 05/06/19 1911  NA 139   < > 138  --    < > 140 140  --  140 143 148*  K 5.0   < > 3.3*  --    < > 3.4* 3.5  --  3.9 3.3* 3.5  CL 103   < > 103  --   --   --  103  --  104 106 108  CO2 25   < > 24  --   --   --  27  --  _0 GLUCOSE 132*   < > 148*  --   --   --  158*  --  133* 126* 104*  BUN 18   < > 30*  --   --   --  39*  --  41* 38* 36*  CREATININE 1.35*   < > 1.26*  --   --   --  1.50*  --  1.54* 1.32* 1.09*  CALCIUM 7.8*   < > 7.5*   < >  --   --  8.0*   < > 7.8* 8.3* 8.4*  MG 1.5*  --  2.2  --   --   --   --   --   --   --   --   PHOS 3.8  --   --   --   --   --   --   --   --   --   --    < > =  values in this interval not displayed.    Liver Function Tests: Recent Labs  Lab 05/02/19 1953  AST 29  ALT 17  ALKPHOS 73  BILITOT 0.8  PROT 7.5  ALBUMIN 3.7   No results for input(s): LIPASE, AMYLASE in the last 168 hours. No results for input(s): AMMONIA in the last 168 hours.  CBC: Recent Labs  Lab 05/02/19 1953 05/03/19 0106 05/03/19 0450 05/03/19 0450 05/04/19 1022 05/04/19 1452 05/04/19 2154 05/05/19 0039 05/05/19 0514 05/05/19 1021 05/06/19 0804  WBC 20.0*  --  33.0*  --  45.5*  --   --   --   --  45.3* 37.9*  NEUTROABS 18.4*  --   --   --   --   --   --   --   --   --   --   HGB 14.1   < > 14.3   < > 13.7   < > 15.6* 15.6* 15.3* 12.7 12.0  HCT 44.8   < > 45.6   < > 43.4   < > 46.0 46.0 45.0 41.2 38.4  MCV 88.2  --  87.9  --  90.2  --   --   --   --  91.8 89.9  PLT 412*  --  435*  --  295  --   --   --   --  227 181   < > = values in this interval not displayed.    Cardiac Enzymes: No results for input(s): CKTOTAL, CKMB, CKMBINDEX, TROPONINI in the last 168 hours.  BNP: BNP (last 3 results) Recent Labs    06/15/18 0744 05/02/19 2350  BNP 49.0 441.1*    ProBNP (last 3 results) No results for input(s): PROBNP in  the last 8760 hours.    Other results:  Imaging: No results found.   Medications:     Scheduled Medications: . Chlorhexidine Gluconate Cloth  6 each Topical Daily  . enoxaparin (LOVENOX) injection  130 mg Subcutaneous Q12H  . ferrous sulfate  325 mg Oral Q breakfast  . furosemide  80 mg Intravenous BID  . gabapentin  600 mg Oral BID  . insulin aspart  0-20 Units Subcutaneous Q4H  . mouth rinse  15 mL Mouth Rinse BID  . pantoprazole (PROTONIX) IV  40 mg Intravenous QHS  . pravastatin  20 mg Oral Daily  . vitamin B-12  500 mcg Oral Daily    Infusions: . sodium chloride Stopped (05/07/19 0008)  . cefTRIAXone (ROCEPHIN)  IV Stopped (05/06/19 1203)  . dexmedetomidine (PRECEDEX) IV infusion 0.2 mcg/kg/hr (05/07/19 0300)  . norepinephrine (LEVOPHED) Adult infusion Stopped (05/06/19 0802)  . treprostinil (REMODULIN) infusion 81 ng/kg/min (05/06/19 2127)    PRN Medications: docusate sodium, ondansetron (ZOFRAN) IV, polyethylene glycol   Assessment/Plan:   1. Shock, septic - Likely source lower extremity cellulitis. Also ? Possible PNA. She also had hickman catheter in R upper chest (has been removed) now w/ new LIJ CVC. Blood Cultures + for Strep Group G - on abx w/ ceftriaxone  - Off NE and neo. MAPs upper 70s.  - Lactic acid has normalized. AF. WBC downtrending  - Remained concerned that LE cellulitis may be polymicrobial. Consider ID consult  - ideally would get TEE but likely respiratory status won't tolerate and may need to consider longer abx course   2. LE cellulitis - plan as above - Appreciate WOC assistance Will need compression and diuresis as tolerated  3. A/C Hypoxic Respiratory Failure - Hypoxic  on admit. Chronically on 4 liters but requiring HFNC 15 L currently. Requires BiPAP at night  - Tenuous but improving - diurese as tolerated  4. NSTEMI  -Trend 183>4373> 2856. Suspect demand ischemia - 2015 had myoviewIn EF 78% and no ischemia  - Not having  chest pain. EF stable on echo - can consider cath as course determines  5. Severe PAH with Cor Pulmonale: - On IV remodulin with [facility]. Need to continue - [place] sildenafil with hypotension - Hickman cath removed for sepsis, continue to infuse via central line   6. PAF - in NSR. On lovenox  7. Acute on chronic diastolic HF - diurese as tolerated - assessment of volume status difficult given super morbid obesity - will order CVP monitoring to help guide diuresis     Length of Stay: 5   Brittainy Simmons PA-C 05/07/2019, 9:39 AM  Advanced Heart Failure Team Pager [phone] (M-F; 7a - 4p)  Please contact [facility] for night-coverage after hours (4p -7a ) and weekends on [email or web address]  Patient seen and examined with the above-signed Advanced Practice Provider and/or Housestaff. I personally reviewed laboratory data, imaging studies and relevant notes. I independently examined the patient and formulated the important aspects of the plan. I have edited the note to reflect any of my changes or salient points. I have personally discussed the plan with the patient and/or family.  Feels weak and SOB.  Now off pressors. MAPs in 27s. Hickman out. Afebrile. On ceftriaxone. Diuresing on IV lasix. On remodulin through central line  General:  Chronically ill appearing. Flushed. No resp difficulty HEENT: normal Neck: supple.+ JVD. + central line  Carotids 2+ bilat; no bruits. No lymphadenopathy or thryomegaly appreciated. Cor: PMI nondisplaced. Regular rate & rhythm. 2/6 TR Lungs: clear Abdomen: obese soft, nontender, nondistended. No hepatosplenomegaly. No bruits or masses. Good bowel sounds. Extremities: no cyanosis, clubbing, rash, 1+ edema Severe erythema on left leg all the way to pubis.  Neuro: alert & orientedx3, cranial nerves grossly intact. moves all 4 extremities w/o difficulty. Affect pleasant  Strep sepsis seems to be improving but still with severe RLE cellulitic changes. D/w ID  who feels that this is related to a lag in improvement in skin changes equivalent to a burn. Recommend continuing the course.   Will continue ceftriaxone. Continue diuresis. Ideally would need TEE but doubt respiratory status will tolerate currently. Can reassess. Continue remodulin via TLC. Will need to replace Hickman soon.   Glori Bickers, MD  5:54 PM

## 2019-05-07 NOTE — Progress Notes (Signed)
Placed back on BIPAP due to inc WOB

## 2019-05-07 NOTE — Progress Notes (Signed)
NAME:  [name], MRN:  [id number], DOB:  07/23/1960, LOS: 5 ADMISSION DATE:  05/02/2019, CONSULTATION DATE:  05/02/19 REFERRING MD:  Laverta Baltimore, CHIEF COMPLAINT:  Dypsnea   Brief History   [name] is a 59 y.o. female with a pertinent PMH of PAH on Remodulin and Sildenafil (Mean PAP 54, PCW 12), OSA, morbid obesity, DM2, CKD, who presented with fever and productive cough and was admitted with sepsis 2/2 CAP and possible RLE cellulitis along with acute on chronic hypoxic respiratory failure.   History of present illness   [name] is a 59 y.o. female who has a PMH including but not limited to [place] on treprostinol, CHF, PAF on xarelto, Morbid obesity (see "past medical history" for rest).  She presented to [facility] 4/28 with fever and productive cough with associated diarrhea and generalized weakness.  She states diarrhea has occurred "for a few days" but over the past 1 - 2 days, she also began to develop chills and generalized weakness.  Denies chest pain, N/V, abd pain, myalgias.  In ED, she was found to be hypoxic to the 80's despite her baseline of 4L O2.  She was increased to 8L with improvement in sats to low 90's.  She was also hypotensive to the 70's SBP.  She was started on gentle IVF hydration given her hx of PAH and current respiratory failure.  CXR showed possible retrocardiac infiltrate and she was febrile to 103F. She was also found to have findings c/w RLE cellulitis (has venous stasis change to BLE's; however, RLE warmer compared to LLE).  PCCM called for admission.  Past Medical History  has ANEMIA, B12 DEFICIENCY; Obstructive sleep apnea; GERD; Obesity; Diabetes ([place]); Hyperlipidemia; Allergic rhinitis; Right heart failure, NYHA class 3 ([place]); Pulmonary hypertension ([place]); Atrial flutter ([place]); Acute on chronic diastolic (congestive) heart failure ([place]); Peripheral neuropathy ([place]); Healthcare maintenance; Rash; Postmenopausal vaginal bleeding; Lower extremity edema;  Ovarian cyst; Hematoma of arm, left, subsequent encounter; Abdominal muscle strain, initial encounter; Acute on chronic respiratory failure ([place]); CAP (community acquired pneumonia); Atelectasis; Urinary incontinence; Bleeding hemorrhoid; and Vaginal itching on their problem list.  [facility] Events   4/28 > admit started on HFNC and transitioned to NIPPV 2/2 to desaturation   Consults:  Heart Failure  Procedures:  4/28 Art line pending> 4/29: CVC >> pulled out 4/30   Significant Diagnostic Tests:  4/28 CXR > possible retrocardiac infiltrate.  Micro Data:  Flu 4/28 > neg. COVID 4/28 > neg. Blood 4/28 >  RVP 4/28 >   Antimicrobials:  Vanc 4/28 > 4/29 Ceftriaxone 4/28 > 4/30 Azithromycin 4/28 >   Interim history/subjective:  Overnight no issues. Responding well to diuresis. Off pressors. Tolerated about an hour off bipap this morning. Was able to take some meds.   Objective   Blood pressure (!) 85/67, pulse 75, temperature 98.1 F (36.7 C), temperature source Oral, resp. rate (!) 24, weight 134.7 kg, last menstrual period 02/19/2011, SpO2 96 %.    FiO2 (%):  [75 %-100 %] 100 %   Intake/Output Summary (Last 24 hours) at 05/07/2019 1052 Last data filed at 05/07/2019 0300 Gross per 24 hour  Intake 592.08 ml  Output 1900 ml  Net -1307.92 ml   Filed Weights   05/03/19 0451 05/04/19 0500 05/05/19 0428  Weight: 134.4 kg 135.9 kg 134.7 kg    Examination: Gen: Morbidly obese female, on bipap. Diffusely volume overloaded Neck: JVD difficult to assess 2/2 body habitus, left neck central line Heart:  RRR, S1, S2, no M/R/G, no chest wall tenderness.  Lungs: Distant breath sounds;increased work of breathing Abdomen: distended, soft Extremities: no clubbing, cyanosis. bilateral lower extremities have 2+ edema to the knees; pulses are +2 in bilateral upper and lower extremities Skin: trunk face and upper extremities are flushed, venous stasis changes bilaterally;  warmth and  erythema in R leg to the calf has extended since the last 48 hours, tenderness Neuro: somnolent but arousable to verbal stimuli and follows commands  Resolved Hospital Problem list   none  Assessment & Plan:  Acute on chronic hypoxic and hypercarbic respiratory failure: Likely multifactorial but incited by CAP/bacteremia in setting of severe underlying PAH. Blood cultures grew strep spp, as below. We will continue supplemental O2 as needed to maintain SpO2 > 88%.  - Continue Ceftriaxone  - PAH, CAP mgmt as below   Severe PAH  HFpEF with cor pulmonale - on Remodulin and sildenafil and followed by Dr. Haroldine Laws and Dr. Gilles Chiquito at [facility]  RHC from 06/27/18 with PA 54, PCW 12, PA sat 59% and 63%.Holding sildenafil given her hypotension. TTE on 4/29 showed decreased PA pressures likely 2/2 worsening RV function rather than true improvement. RA pressure 15, LVEF 55-60%. Home Hickman removed for bacteremia.  - will repeat blood cultures. Will need alternate port placement for remodulin prior to discharge. Continue through central line for now.  - Heart failure consulted, appreciate recs.  - Continue home Remodulin - Hold sildenafil  - strict I/Os -on full dose AC with lovenox.  (on xarelto at home)   Strep G bacteremia  CAP  RLE cellulitis:  Continue ceftriaxone TTE did not show evidence of vegetations. Consulted ID on 4/30 will need TEE when stable.  - ID consulted, appreciate recs - will discuss if therapy needs to be broadened for worsening cellulitis RLE.   Mixed cardiogenic/septic shock- 2/2 strep bacteremia:  - Treatment of fluid status and bacteremia as above - off norepinephrine for now - co-ox shows ScVO2 76% suggestive of more septic picture.  - suspect she needs more diruesis given her physical exam and grossly volume overloaded state.  - continue lasix. Will send a BNP  Elevated troponin  H/o HFpEF w/ cor pulmonale - 3208->2856; likely demand ischemia. Pro-BNP 441. Troponin  now downtrending. Echo on 05/03/19 showed lower PA pressures from prior studies likely 2/2 worsening of RV function; LVEF 50-60% and no valvular disease.  - HF recs ASA however pt is allergic (airway swelling). Will not give ASA at this point    Paroxysmal Atrial Fib: Long h/o PAF on Xarelto and compliant at home. No bleeding signs/sx. Pt has not been in AF during this hospitalization.  - Lovenox in lieu of home Xarelto   FEN: Pt had severe hypokalemia to <2.7 on presentation, presumed 2/2 diarrhea. . - Most recent K is 3.3, replacing today peripherally. No enteral access. - Daily BMP with repeat this afternoon  Type 2 Diabetes Mellitus: Non insulin dependent, on metformin.  - SSI. - Hold home metformin.  Acute metabolic encephalopathy - likely secondary to worsening hypercarbia - superimposed infection  Best practice:  Diet: NPO for respiratory status.  Pain/Anxiety/Delirium protocol (if indicated): N/A. VAP protocol (if indicated): N/A. DVT prophylaxis: SCD's / Lovenox. GI prophylaxis: N/A. Glucose control: SSI. Mobility: Bedrest. Code Status: Limited code - no intubation but OK with CPR in the event of arrest. Family Communication: Son updated at bedside 5/1. Will updated again today. Disposition: ICU.  The patient is critically ill with multiple  organ systems failure and requires high complexity decision making for assessment and support, frequent evaluation and titration of therapies, application of advanced monitoring technologies and extensive interpretation of multiple databases.   Critical Care Time devoted to patient care services described in this note is 36 minutes. This time reflects time of care of this [place] . This critical care time does not reflect separately billable procedures or procedure time, teaching time or supervisory time of PA/NP/Med student/Med Resident etc but could involve care discussion time.  [facility] 05/05/2019 9:43 AM  Pager: [phone] After hours pager: [phone]   Labs   CBC: Recent Labs  Lab 05/02/19 1953 05/03/19 0106 05/03/19 0450 05/03/19 0450 05/04/19 1022 05/04/19 1452 05/04/19 2154 05/05/19 0039 05/05/19 0514 05/05/19 1021 05/06/19 0804  WBC 20.0*  --  33.0*  --  45.5*  --   --   --   --  45.3* 37.9*  NEUTROABS 18.4*  --   --   --   --   --   --   --   --   --   --   HGB 14.1   < > 14.3   < > 13.7   < > 15.6* 15.6* 15.3* 12.7 12.0  HCT 44.8   < > 45.6   < > 43.4   < > 46.0 46.0 45.0 41.2 38.4  MCV 88.2  --  87.9  --  90.2  --   --   --   --  91.8 89.9  PLT 412*  --  435*  --  295  --   --   --   --  227 181   < > = values in this interval not displayed.    Basic Metabolic Panel: Recent Labs  Lab 05/03/19 0450 05/03/19 0450 05/04/19 1022 05/04/19 1452 05/05/19 0514 05/05/19 1021 05/05/19 1544 05/06/19 0804 05/06/19 1911  NA 139   < > 138   < > 140 140 140 143 148*  K 5.0   < > 3.3*   < > 3.4* 3.5 3.9 3.3* 3.5  CL 103   < > 103  --   --  103 104 106 108  CO2 25   < > 24  --   --  _0 GLUCOSE 132*   < > 148*  --   --  158* 133* 126* 104*  BUN 18   < > 30*  --   --  39* 41* 38* 36*  CREATININE 1.35*   < > 1.26*  --   --  1.50* 1.54* 1.32* 1.09*  CALCIUM 7.8*   < > 7.5*  --   --  8.0* 7.8* 8.3* 8.4*  MG 1.5*  --  2.2  --   --   --   --   --   --   PHOS 3.8  --   --   --   --   --   --   --   --    < > = values in this interval not displayed.   GFR: Estimated Creatinine Clearance: 77.3 mL/min (A) (by C-G formula based on SCr of 1.09 mg/dL (H)). Recent Labs  Lab 05/02/19 1953 05/02/19 2140 05/03/19 0450 05/03/19 1506 05/04/19 1022 05/05/19 1021 05/05/19 1232 05/06/19 0804  WBC   < >  --  33.0*  --  45.5* 45.3*  --  37.9*  LATICACIDVEN  --  3.1*  --  2.7*  --  1.2 0.9  --    < > = values in this interval not displayed.    Liver Function Tests: Recent Labs  Lab 05/02/19 1953  AST 29  ALT 17  ALKPHOS  73  BILITOT 0.8  PROT 7.5  ALBUMIN 3.7   No results for input(s): LIPASE, AMYLASE in the last 168 hours. No results for input(s): AMMONIA in the last 168 hours.  ABG    Component Value Date/Time   PHART 7.216 (L) 05/05/2019 0514   PCO2ART 71.3 (HH) 05/05/2019 0514   PO2ART 79 (L) 05/05/2019 0514   HCO3 29.0 (H) 05/05/2019 0514   TCO2 31 05/05/2019 0514   ACIDBASEDEF 1.0 05/05/2019 0514   O2SAT 76.9 05/05/2019 1021     Coagulation Profile: Recent Labs  Lab 05/02/19 1953  INR 1.3*    Cardiac Enzymes: No results for input(s): CKTOTAL, CKMB, CKMBINDEX, TROPONINI in the last 168 hours.  HbA1C: Hemoglobin A1C  Date/Time Value Ref Range Status  04/09/2019 01:48 PM 5.5 4.0 - 5.6 % Final  07/11/2018 04:18 PM 6.1 (A) 4.0 - 5.6 % Final   Hgb A1c MFr Bld  Date/Time Value Ref Range Status  10/14/2010 10:41 AM 13.5 (H) <5.7 % Final    Comment:                                                                           According to the ADA Clinical Practice Recommendations for 2011, when HbA1c is used as a screening test:     >=6.5%   Diagnostic of Diabetes Mellitus            (if abnormal result is confirmed)   5.7-6.4%   Increased risk of developing Diabetes Mellitus   References:Diagnosis and Classification of Diabetes Mellitus,Diabetes [id number],8381,84(CRFVO 1):S62-S69 and Standards of Medical Care in         Diabetes - 2011,Diabetes HKGO,7703,40 (Suppl 1):S11-S61.    CBG: Recent Labs  Lab 05/06/19 1607 05/06/19 2017 05/07/19 0024 05/07/19 0425 05/07/19 0821  GLUCAP 99 99 85 90 88

## 2019-05-07 NOTE — Care Management (Signed)
CM unable to speak with pt as she is currently on BIPAP.  CM was able to speak with her son Juliane Lack.  Per Juliane Lack pt was on Remodulin PTA and they independently manage it at home via [place].  Juliane Lack confirms that pt lives with him and is independent with mobility and ADLs.   CM reached out to [facility] to gain clarity with consult ordered that does not specify medication.  Pharmacy verified that pt is already set up for home IV infusion - consult will be discontinued

## 2019-05-08 DIAGNOSIS — I2721 Secondary pulmonary arterial hypertension: Secondary | ICD-10-CM | POA: Diagnosis not present

## 2019-05-08 DIAGNOSIS — L03115 Cellulitis of right lower limb: Secondary | ICD-10-CM | POA: Diagnosis not present

## 2019-05-08 DIAGNOSIS — E876 Hypokalemia: Secondary | ICD-10-CM

## 2019-05-08 DIAGNOSIS — I5033 Acute on chronic diastolic (congestive) heart failure: Secondary | ICD-10-CM | POA: Diagnosis not present

## 2019-05-08 DIAGNOSIS — R7881 Bacteremia: Secondary | ICD-10-CM | POA: Diagnosis not present

## 2019-05-08 LAB — GLUCOSE, CAPILLARY
Glucose-Capillary: 85 mg/dL (ref 70–99)
Glucose-Capillary: 94 mg/dL (ref 70–99)
Glucose-Capillary: 97 mg/dL (ref 70–99)
Glucose-Capillary: 100 mg/dL — ABNORMAL HIGH (ref 70–99)
Glucose-Capillary: 141 mg/dL — ABNORMAL HIGH (ref 70–99)

## 2019-05-08 MED ORDER — POTASSIUM CHLORIDE 20 MEQ PO PACK
40.0000 meq | PACK | Freq: Once | ORAL | Status: AC
  Administered 2019-05-08: 15:00:00 40 meq via ORAL
  Filled 2019-05-08: qty 2

## 2019-05-08 MED ORDER — ENOXAPARIN SODIUM 150 MG/ML ~~LOC~~ SOLN
130.0000 mg | Freq: Two times a day (BID) | SUBCUTANEOUS | Status: DC
  Administered 2019-05-09 – 2019-05-10 (×2): 130 mg via SUBCUTANEOUS
  Filled 2019-05-08 (×2): qty 0.87

## 2019-05-08 MED ORDER — SILDENAFIL CITRATE 20 MG PO TABS
20.0000 mg | ORAL_TABLET | Freq: Three times a day (TID) | ORAL | Status: DC
  Administered 2019-05-08 – 2019-05-24 (×47): 20 mg via ORAL
  Filled 2019-05-08 (×52): qty 1

## 2019-05-08 MED ORDER — CEFAZOLIN SODIUM-DEXTROSE 2-4 GM/100ML-% IV SOLN
2.0000 g | Freq: Three times a day (TID) | INTRAVENOUS | Status: DC
  Administered 2019-05-08 – 2019-05-09 (×3): 2 g via INTRAVENOUS
  Filled 2019-05-08 (×4): qty 100

## 2019-05-08 NOTE — Progress Notes (Signed)
Advanced Heart Failure Rounding Note   Subjective:    Remains on high-flow O2 at 30L. Says She feels a bit better. Off pressors. Lasix stopped due to hypernatremia. On Remodulin via central line.   AF. WBC 45k -> 37k  Objective:   Weight Range:  Vital Signs:   Temp:  [97.7 F (36.5 C)-98.4 F (36.9 C)] 97.7 F (36.5 C) (05/04 0700) Pulse Rate:  [68-89] 78 (05/04 0800) Resp:  [14-26] 21 (05/04 0800) BP: (93-123)/(50-72) 123/70 (05/04 0800) SpO2:  [90 %-99 %] 93 % (05/04 0800) FiO2 (%):  [75 %-100 %] 80 % (05/04 0800) Weight:  [126.7 kg] 126.7 kg (05/04 0500) Last BM Date: 05/05/19  Weight change: Filed Weights   05/04/19 0500 05/05/19 0428 05/08/19 0500  Weight: 135.9 kg 134.7 kg 126.7 kg    Intake/Output:   Intake/Output Summary (Last 24 hours) at 05/08/2019 0940 Last data filed at 05/08/2019 0800 Gross per 24 hour  Intake 466.62 ml  Output 3130 ml  Net -2663.38 ml     Physical Exam: General:  Chronically ill appearing, morbidly obese. Sitting in bed on HFNC HEENT: normal x flushed Neck: supple. Thick neck, difficult to assess JVD. Carotids 2+ bilat; no bruits. No lymphadenopathy or thryomegaly appreciated. + LIJ CVC Cor: PMI nondisplaced. Regular rate & rhythm. 2/6 TR Lungs: coarse Abdomen: obese soft, nontender, nondistended. No hepatosplenomegaly. No bruits or masses. Good bowel sounds. Extremities: no cyanosis, clubbing, rash,severe erythema on RLE form foot to pubis. Mild erythema on left 1+ edema Neuro: alert & orientedx3, cranial nerves grossly intact. moves all 4 extremities w/o difficulty. Affect pleasant   Telemetry: NSR 70-80s Personally reviewed   Labs: Basic Metabolic Panel: Recent Labs  Lab 05/03/19 0450 05/03/19 0450 05/04/19 1022 05/04/19 1022 05/04/19 1452 05/05/19 0514 05/05/19 1021 05/05/19 1021 05/05/19 1544 05/06/19 0804 05/06/19 1911  NA 139   < > 138  --    < > 140 140  --  140 143 148*  K 5.0   < > 3.3*  --    < > 3.4*  3.5  --  3.9 3.3* 3.5  CL 103   < > 103  --   --   --  103  --  104 106 108  CO2 25   < > 24  --   --   --  27  --  _0 GLUCOSE 132*   < > 148*  --   --   --  158*  --  133* 126* 104*  BUN 18   < > 30*  --   --   --  39*  --  41* 38* 36*  CREATININE 1.35*   < > 1.26*  --   --   --  1.50*  --  1.54* 1.32* 1.09*  CALCIUM 7.8*   < > 7.5*   < >  --   --  8.0*   < > 7.8* 8.3* 8.4*  MG 1.5*  --  2.2  --   --   --   --   --   --   --   --   PHOS 3.8  --   --   --   --   --   --   --   --   --   --    < > = values in this interval not displayed.    Liver Function Tests: Recent Labs  Lab 05/02/19 1953  AST 29  ALT 17  ALKPHOS 73  BILITOT 0.8  PROT 7.5  ALBUMIN 3.7   No results for input(s): LIPASE, AMYLASE in the last 168 hours. No results for input(s): AMMONIA in the last 168 hours.  CBC: Recent Labs  Lab 05/02/19 1953 05/03/19 0106 05/03/19 0450 05/03/19 0450 05/04/19 1022 05/04/19 1452 05/04/19 2154 05/05/19 0039 05/05/19 0514 05/05/19 1021 05/06/19 0804  WBC 20.0*  --  33.0*  --  45.5*  --   --   --   --  45.3* 37.9*  NEUTROABS 18.4*  --   --   --   --   --   --   --   --   --   --   HGB 14.1   < > 14.3   < > 13.7   < > 15.6* 15.6* 15.3* 12.7 12.0  HCT 44.8   < > 45.6   < > 43.4   < > 46.0 46.0 45.0 41.2 38.4  MCV 88.2  --  87.9  --  90.2  --   --   --   --  91.8 89.9  PLT 412*  --  435*  --  295  --   --   --   --  227 181   < > = values in this interval not displayed.    Cardiac Enzymes: No results for input(s): CKTOTAL, CKMB, CKMBINDEX, TROPONINI in the last 168 hours.  BNP: BNP (last 3 results) Recent Labs    06/15/18 0744 05/02/19 2350 05/07/19 1112  BNP 49.0 441.1* 325.9*    ProBNP (last 3 results) No results for input(s): PROBNP in the last 8760 hours.    Other results:  Imaging: No results found.   Medications:     Scheduled Medications: . Chlorhexidine Gluconate Cloth  6 each Topical Daily  . enoxaparin (LOVENOX) injection  130  mg Subcutaneous Q12H  . ferrous sulfate  325 mg Oral Q breakfast  . gabapentin  600 mg Oral BID  . insulin aspart  0-20 Units Subcutaneous Q4H  . mouth rinse  15 mL Mouth Rinse BID  . pantoprazole (PROTONIX) IV  40 mg Intravenous QHS  . pravastatin  20 mg Oral Daily  . vitamin B-12  500 mcg Oral Daily    Infusions: . sodium chloride 10 mL/hr at 05/08/19 0800  . cefTRIAXone (ROCEPHIN)  IV Stopped (05/07/19 1252)  . dexmedetomidine (PRECEDEX) IV infusion 0.3 mcg/kg/hr (05/08/19 0800)  . treprostinil (REMODULIN) infusion 81 ng/kg/min (05/06/19 2127)    PRN Medications: docusate sodium, ondansetron (ZOFRAN) IV, polyethylene glycol   Assessment/Plan:   1. Shock, septic - Likely source lower extremity cellulitis. Also ? Possible PNA. She also had hickman catheter in R upper chest (has been removed) now w/ new LIJ CVC. Blood Cultures + for Strep Group G - on abx w/ ceftriaxone. AF/ WBC improving - Off pressors - f/u cx negative. Plan to replace Hickman soon - ideally would get TEE but likely respiratory status won't tolerate and may need to consider longer abx course   2. LE cellulitis - Seen by ID feel that it is improving but skin changes look like thermal burn.  - elevate leg as toelrated  3. A/C Hypoxic Respiratory Failure - Remains tenuous. Of [place]. On HF [place] - diurese as tolerated - weight down 15 pounds but still about 20 pounds up from baseline (260 pounds)  4. NSTEMI  -Trend 115>7262> 2856. Suspect demand ischemia - 2015 had myoview In EF 78% and no  ischemia  - Not having chest pain. EF stable on echo - can consider cath as course determines. No plans currently  5. Severe PAH with Cor Pulmonale: - On IV remodulin with [facility]. Need to continue - Will resume sildeanfil - Pending replacement of Hickman  6. PAF - in NSR. On lovenox  7. Acute on chronic diastolic HF - diurese as tolerated - assessment of volume status difficult given super morbid obesity -  diuretics held today for hypernatremia. Still up about 20 pounds. Will resume IV lasix in am. D/w Dr. Caroline More of Stay: 6   Glori Bickers MD 05/08/2019, 9:40 AM  Advanced Heart Failure Team Pager [phone] (M-F; 7a - 4p)  Please contact [facility] for night-coverage after hours (4p -7a ) and weekends on [email or web address]

## 2019-05-08 NOTE — Progress Notes (Signed)
Notified provider pt requesting to eat. Provider okay with patient eating and drinking.

## 2019-05-08 NOTE — Progress Notes (Signed)
NAME:  [name], MRN:  [id number], DOB:  12/27/1960, LOS: 6 ADMISSION DATE:  05/02/2019, CONSULTATION DATE:  05/02/19 REFERRING MD:  Laverta Baltimore, CHIEF COMPLAINT:  Dypsnea   Brief History   [name] is a 59 y.o. female with a pertinent PMH of PAH on Remodulin and Sildenafil (Mean PAP 54, PCW 12), OSA, morbid obesity, DM2, CKD, who presented with fever and productive cough and was admitted with sepsis 2/2 CAP and possible RLE cellulitis along with acute on chronic hypoxic respiratory failure.   History of present illness   [name] is a 59 y.o. female who has a PMH including but not limited to [place] on treprostinol, CHF, PAF on xarelto, Morbid obesity (see "past medical history" for rest).  She presented to [facility]/28 with fever and productive cough with associated diarrhea and generalized weakness.  She states diarrhea has occurred "for a few days" but over the past 1 - 2 days, she also began to develop chills and generalized weakness.  Denies chest pain, N/V, abd pain, myalgias.  In ED, she was found to be hypoxic to the 80's despite her baseline of 4L O2.  She was increased to 8L with improvement in sats to low 90's.  She was also hypotensive to the 70's SBP.  She was started on gentle IVF hydration given her hx of PAH and current respiratory failure.  CXR showed possible retrocardiac infiltrate and she was febrile to 103F. She was also found to have findings c/w RLE cellulitis (has venous stasis change to BLE's; however, RLE warmer compared to LLE).  PCCM called for admission.  Past Medical History  has ANEMIA, B12 DEFICIENCY; Obstructive sleep apnea; GERD; Obesity; Diabetes ([place]); Hyperlipidemia; Allergic rhinitis; Right heart failure, NYHA class 3 ([place]); Pulmonary hypertension ([place]); Atrial flutter ([place]); Acute on chronic diastolic (congestive) heart failure ([place]); Peripheral neuropathy ([place]); Healthcare maintenance; Rash; Postmenopausal vaginal bleeding; Lower extremity edema;  Ovarian cyst; Hematoma of arm, left, subsequent encounter; Abdominal muscle strain, initial encounter; Acute on chronic respiratory failure ([place]); CAP (community acquired pneumonia); Atelectasis; Urinary incontinence; Bleeding hemorrhoid; and Vaginal itching on their problem list.  [place] [facility] Events   4/28 > admit started on HFNC and transitioned to NIPPV 2/2 to desaturation   Consults:  Heart Failure  Procedures:  4/28 Art line pending> 4/29: CVC >> pulled out 4/30   Significant Diagnostic Tests:  4/28 CXR > possible retrocardiac infiltrate.  Micro Data:  Flu 4/28 > neg. COVID 4/28 > neg. Blood 4/28 >  RVP 4/28 >   Antimicrobials:  Vanc 4/28 > 4/29 Ceftriaxone 4/28 > 4/30 Azithromycin 4/28 >   Interim history/subjective:  This morning awake, alert, following commands and answering questions appropriately  Objective   Blood pressure 137/69, pulse 82, temperature 97.7 F (36.5 C), temperature source Oral, resp. rate (!) 29, weight 126.7 kg, last menstrual period 02/19/2011, SpO2 96 %. CVP:  [10 mmHg-14 mmHg] 10 mmHg  FiO2 (%):  [60 %-100 %] 60 %   Intake/Output Summary (Last 24 hours) at 05/08/2019 1114 Last data filed at 05/08/2019 0800 Gross per 24 hour  Intake 453.15 ml  Output 3130 ml  Net -2676.85 ml   Filed Weights   05/04/19 0500 05/05/19 0428 05/08/19 0500  Weight: 135.9 kg 134.7 kg 126.7 kg    Examination: Gen: Morbidly obese female, on nasal cannula. Diffusely volume overloaded Neck: JVD difficult to assess 2/2 body habitus, left neck central line Heart: RRR, S1, S2, no M/R/G, no chest wall tenderness.  Lungs: Distant breath sounds;increased work of breathing Abdomen: distended, soft Extremities: no clubbing, cyanosis. bilateral lower extremities have 2+ edema to the knees; pulses are +2 in bilateral upper and lower extremities Skin: trunk face and upper extremities are flushed, venous stasis changes bilaterally;  warmth and erythema in R leg to  the calf has extended since the last 48 hours, tenderness Neuro: awake and more alert today.  Resolved Hospital Problem list   Septic/cardiogenic shock   Assessment & Plan:  Acute on chronic hypoxic and hypercarbic respiratory failure: Likely multifactorial but incited by CAP/bacteremia in setting of severe underlying PAH. Blood cultures grew strep spp, as below. We will continue supplemental O2 as needed to maintain SpO2 > 88%.  - Continue Ceftriaxone  - PAH, CAP mgmt as below   Severe PAH  HFpEF with cor pulmonale - on Remodulin and sildenafil and followed by Dr. Haroldine Laws and Dr. Gilles Chiquito at [facility].  RHC from 06/27/18 with PA 54, PCW 12, PA sat 59% and 63%.Holding sildenafil given her hypotension. TTE on 4/29 showed decreased PA pressures likely 2/2 worsening RV function rather than true improvement. RA pressure 15, LVEF 55-60%. Home Hickman removed for bacteremia.  - will repeat blood cultures. Will need alternate port placement for remodulin prior to discharge. Continue through central line for now.  - Heart failure consulted, appreciate recs.  - Continue home Remodulin - Hold sildenafil  - strict I/Os -on full dose AC with lovenox.  (on xarelto at home)  - holding diuresis today given hypernatremia - she will probably need more IV diuretics prior to discharge - will place IR consult for tunneled line replacement now that cultures are negative  Strep G bacteremia  CAP  RLE cellulitis:  Continue ceftriaxone TTE did not show evidence of vegetations. Consulted ID on 4/30 will need TEE when stable.  - ID consulted, appreciate recs - will discuss if therapy needs to be broadened for worsening cellulitis RLE.    Elevated troponin  H/o HFpEF w/ cor pulmonale - 3208->2856; likely demand ischemia. Pro-BNP 441. Troponin now downtrending. Echo on 05/03/19 showed lower PA pressures from prior studies likely 2/2 worsening of RV function; LVEF 50-60% and no valvular disease.  - HF recs ASA  however pt is allergic (airway swelling). Will not give ASA at this point    Paroxysmal Atrial Fib: Long h/o PAF on Xarelto and compliant at home. No bleeding signs/sx. Pt has not been in AF during this hospitalization.  - Lovenox in lieu of home Xarelto   FEN: Pt had severe hypokalemia to <2.7 on presentation, presumed 2/2 diarrhea. . - Most recent K is 3.5, replacing today peripherally. No enteral access. - Daily BMP with repeat this afternoon  Type 2 Diabetes Mellitus: Non insulin dependent, on metformin.  - SSI. - Hold home metformin.  Acute metabolic encephalopathy - improving with treatment of sepsis and hypercarbia  Best practice:  Diet: NPO for respiratory status.  Pain/Anxiety/Delirium protocol (if indicated): N/A. VAP protocol (if indicated): N/A. DVT prophylaxis: SCD's / Lovenox. GI prophylaxis: N/A. Glucose control: SSI. Mobility: Bedrest. Code Status: Limited code - no intubation but OK with CPR in the event of arrest. Disposition: ICU.  The patient is critically ill with multiple organ systems failure and requires high complexity decision making for assessment and support, frequent evaluation and titration of therapies, application of advanced monitoring technologies and extensive interpretation of multiple databases.   Critical Care Time devoted to patient care services described in this note is 35 minutes. This  time reflects time of care of this [place] . This critical care time does not reflect separately billable procedures or procedure time, teaching time or supervisory time of PA/NP/Med student/Med Resident etc but could involve care discussion time.  [facility] 05/05/2019 9:43 AM  Pager: [phone] After hours pager: [phone]   Labs   CBC: Recent Labs  Lab 05/02/19 1953 05/03/19 0106 05/03/19 0450 05/03/19 0450 05/04/19 1022 05/04/19 1452 05/04/19 2154 05/05/19 0039  05/05/19 0514 05/05/19 1021 05/06/19 0804  WBC 20.0*  --  33.0*  --  45.5*  --   --   --   --  45.3* 37.9*  NEUTROABS 18.4*  --   --   --   --   --   --   --   --   --   --   HGB 14.1   < > 14.3   < > 13.7   < > 15.6* 15.6* 15.3* 12.7 12.0  HCT 44.8   < > 45.6   < > 43.4   < > 46.0 46.0 45.0 41.2 38.4  MCV 88.2  --  87.9  --  90.2  --   --   --   --  91.8 89.9  PLT 412*  --  435*  --  295  --   --   --   --  227 181   < > = values in this interval not displayed.    Basic Metabolic Panel: Recent Labs  Lab 05/03/19 0450 05/03/19 0450 05/04/19 1022 05/04/19 1452 05/05/19 0514 05/05/19 1021 05/05/19 1544 05/06/19 0804 05/06/19 1911  NA 139   < > 138   < > 140 140 140 143 148*  K 5.0   < > 3.3*   < > 3.4* 3.5 3.9 3.3* 3.5  CL 103   < > 103  --   --  103 104 106 108  CO2 25   < > 24  --   --  _0 GLUCOSE 132*   < > 148*  --   --  158* 133* 126* 104*  BUN 18   < > 30*  --   --  39* 41* 38* 36*  CREATININE 1.35*   < > 1.26*  --   --  1.50* 1.54* 1.32* 1.09*  CALCIUM 7.8*   < > 7.5*  --   --  8.0* 7.8* 8.3* 8.4*  MG 1.5*  --  2.2  --   --   --   --   --   --   PHOS 3.8  --   --   --   --   --   --   --   --    < > = values in this interval not displayed.   GFR: Estimated Creatinine Clearance: 74.4 mL/min (A) (by C-G formula based on SCr of 1.09 mg/dL (H)). Recent Labs  Lab 05/02/19 1953 05/02/19 2140 05/03/19 0450 05/03/19 1506 05/04/19 1022 05/05/19 1021 05/05/19 1232 05/06/19 0804  WBC   < >  --  33.0*  --  45.5* 45.3*  --  37.9*  LATICACIDVEN  --  3.1*  --  2.7*  --  1.2 0.9  --    < > = values in this interval not displayed.    Liver Function Tests: Recent Labs  Lab 05/02/19 1953  AST 29  ALT 17  ALKPHOS 73  BILITOT  0.8  PROT 7.5  ALBUMIN 3.7   No results for input(s): LIPASE, AMYLASE in the last 168 hours. No results for input(s): AMMONIA in the last 168 hours.  ABG    Component Value Date/Time   PHART 7.216 (L) 05/05/2019 0514   PCO2ART  71.3 (HH) 05/05/2019 0514   PO2ART 79 (L) 05/05/2019 0514   HCO3 29.0 (H) 05/05/2019 0514   TCO2 31 05/05/2019 0514   ACIDBASEDEF 1.0 05/05/2019 0514   O2SAT 76.9 05/05/2019 1021     Coagulation Profile: Recent Labs  Lab 05/02/19 1953  INR 1.3*    Cardiac Enzymes: No results for input(s): CKTOTAL, CKMB, CKMBINDEX, TROPONINI in the last 168 hours.  HbA1C: Hemoglobin A1C  Date/Time Value Ref Range Status  04/09/2019 01:48 PM 5.5 4.0 - 5.6 % Final  07/11/2018 04:18 PM 6.1 (A) 4.0 - 5.6 % Final   Hgb A1c MFr Bld  Date/Time Value Ref Range Status  10/14/2010 10:41 AM 13.5 (H) <5.7 % Final    Comment:                                                                           According to the ADA Clinical Practice Recommendations for 2011, when HbA1c is used as a screening test:     >=6.5%   Diagnostic of Diabetes Mellitus            (if abnormal result is confirmed)   5.7-6.4%   Increased risk of developing Diabetes Mellitus   References:Diagnosis and Classification of Diabetes Mellitus,Diabetes TMYT,1173,56(POLID 1):S62-S69 and Standards of Medical Care in         Diabetes - 2011,Diabetes CVUD,3143,88 (Suppl 1):S11-S61.    CBG: Recent Labs  Lab 05/07/19 1608 05/07/19 2001 05/07/19 2336 05/08/19 0330 05/08/19 0733  GLUCAP 73 94 93 85 94

## 2019-05-08 NOTE — Progress Notes (Signed)
[facility] for Infectious Disease  Date of Admission:  05/02/2019     Total days of antibiotics 7         ASSESSMENT:  [name] continues to improve slowly with ceftriaxone for group G Streptococcus cellulitis complicated by bacteremia.  Cultures from 05/06/2019 are without growth to date.  Narrow antibiotics to cefazolin.  TTE without evidence of endocarditis.  Continues to have cellulitis appearing rash on right lower extremity with most likely result being chronic venous stasis.  Plan for additional 7 days of antimicrobial therapy with penicillin for total of 14 days.  Keep leg elevated as tolerated.  PLAN:  1. Narrow ceftriaxone cefazolin. 2. Elevate leg as possible. 3. Venous stasis and fluid management per primary team and heart failure teams.  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 Chlorhexidine Gluconate Cloth  6 each Topical Daily  . enoxaparin (LOVENOX) injection  130 mg Subcutaneous Q12H  . ferrous sulfate  325 mg Oral Q breakfast  . gabapentin  600 mg Oral BID  . insulin aspart  0-20 Units Subcutaneous Q4H  . mouth rinse  15 mL Mouth Rinse BID  . pantoprazole (PROTONIX) IV  40 mg Intravenous QHS  . pravastatin  20 mg Oral Daily  . sildenafil  20 mg Oral TID  . vitamin B-12  500 mcg Oral Daily    SUBJECTIVE:  Afebrile overnight with no acute events.  Previous white blood cell count downtrending.  Sleepy and wearing CPAP during visit today.  Allergies  Allergen Reactions  . Aspirin     REACTION: airway swelling  . Codeine     REACTION: tingling in lips and hard breathing -  had reaction at dentist - states "I can't take certain kinds of codeine" - happened maybe 10 yr ago  . Lisinopril     Cough  . Sulfonamide Derivatives     REACTION: airway swelling  . Latex Rash     Review of Systems: Review of Systems  Constitutional: Negative for chills, fever and weight loss.  Respiratory: Negative for cough, shortness of breath and wheezing.   Cardiovascular: Positive for leg swelling. Negative for chest pain.  Gastrointestinal: Negative for abdominal pain, constipation, diarrhea, nausea and vomiting.  Skin: Negative for rash.      OBJECTIVE: Vitals:   05/08/19 0600 05/08/19 0700 05/08/19 0725 05/08/19 0800  BP: 113/65 117/67 117/64 123/70  Pulse: 69 68 72 78  Resp: (!) 24 (!) 24 19 (!) 21  Temp:  97.7 F (36.5 C)    TempSrc:  Oral    SpO2: 95% 95% 95% 93%  Weight:       Body mass index is 48.7 kg/m.  Physical Exam Constitutional:      General: She is not in acute distress.    Appearance: She is well-developed.     Comments: Lying in bed with head of bed elevated; lethargic but arousable  Cardiovascular:     Rate and Rhythm: Normal rate and regular rhythm.     Heart sounds: Normal heart sounds.  Pulmonary:     Effort: Pulmonary  effort is normal.     Breath sounds: Normal breath sounds.  Skin:    General: Skin is warm and dry.  Neurological:     Mental Status: She is alert and oriented to person, place, and time.  Psychiatric:        Mood and Affect: Mood normal.     Lab Results Lab Results  Component Value Date   WBC 37.9 (H) 05/06/2019   HGB 12.0 05/06/2019   HCT 38.4 05/06/2019   MCV 89.9 05/06/2019   PLT 181 05/06/2019    Lab Results  Component Value Date   CREATININE 1.09 (H) 05/06/2019   BUN 36 (H) 05/06/2019   NA 148 (H) 05/06/2019   K 3.5 05/06/2019   CL 108 05/06/2019   CO2 29 05/06/2019    Lab Results  Component Value Date   ALT 17 05/02/2019   AST 29 05/02/2019   ALKPHOS 73 05/02/2019   BILITOT 0.8 05/02/2019      Microbiology: Recent Results (from the past 240 hour(s))  Culture, blood (Routine x 2)     Status: Abnormal   Collection Time: 05/02/19  7:30 PM   Specimen: BLOOD LEFT ARM  Result Value Ref Range Status   Specimen Description BLOOD LEFT ARM  Final   Special Requests   Final    BOTTLES DRAWN AEROBIC AND ANAEROBIC Blood Culture adequate volume   Culture  Setup Time   Final    GRAM POSITIVE COCCI IN CHAINS IN BOTH AEROBIC AND ANAEROBIC BOTTLES CRITICAL RESULT CALLED TO, READ BACK BY AND VERIFIED WITH: Criss Rosales PharmD 10:30 05/03/19 ([email or web address]) Performed at [facility], [place] [address] [place], [place] [address]    Culture STREPTOCOCCUS GROUP G (A)  Final   Report Status 05/05/2019 FINAL  Final   Organism ID, Bacteria STREPTOCOCCUS GROUP G  Final      Susceptibility   Streptococcus group g - MIC*    CLINDAMYCIN <=0.25 SENSITIVE Sensitive     AMPICILLIN <=0.25 SENSITIVE Sensitive     ERYTHROMYCIN <=0.12 SENSITIVE Sensitive     VANCOMYCIN 0.5 SENSITIVE Sensitive     CEFTRIAXONE <=0.12 SENSITIVE Sensitive     LEVOFLOXACIN 0.5 SENSITIVE Sensitive     PENICILLIN Value in next row Sensitive      SENSITIVE0.06    * STREPTOCOCCUS GROUP G  Blood Culture ID Panel (Reflexed)     Status: Abnormal   Collection Time: 05/02/19  7:30 PM  Result Value Ref Range Status   Enterococcus species NOT DETECTED NOT DETECTED Final   Listeria monocytogenes NOT DETECTED NOT DETECTED Final   Staphylococcus species NOT DETECTED NOT DETECTED Final   Staphylococcus aureus (BCID) NOT DETECTED NOT DETECTED Final   Streptococcus species DETECTED (A) NOT DETECTED Final    Comment: Not Enterococcus species, Streptococcus agalactiae, Streptococcus pyogenes, or Streptococcus pneumoniae. CRITICAL RESULT CALLED TO, READ BACK BY AND VERIFIED WITH: Criss Rosales PharmD 10:30 05/03/19 ([email or web address])    Streptococcus agalactiae NOT DETECTED NOT DETECTED Final   Streptococcus pneumoniae NOT DETECTED NOT DETECTED Final    Streptococcus pyogenes NOT DETECTED NOT DETECTED Final   Acinetobacter baumannii NOT DETECTED NOT DETECTED Final   Enterobacteriaceae species NOT DETECTED NOT DETECTED Final   Enterobacter cloacae complex NOT DETECTED NOT DETECTED Final   Escherichia coli NOT DETECTED NOT DETECTED Final   Klebsiella oxytoca NOT DETECTED NOT DETECTED Final   Klebsiella pneumoniae NOT DETECTED NOT DETECTED Final   Proteus species NOT DETECTED NOT DETECTED Final   Serratia marcescens NOT  DETECTED NOT DETECTED Final   Haemophilus influenzae NOT DETECTED NOT DETECTED Final   Neisseria meningitidis NOT DETECTED NOT DETECTED Final   Pseudomonas aeruginosa NOT DETECTED NOT DETECTED Final   Candida albicans NOT DETECTED NOT DETECTED Final   Candida glabrata NOT DETECTED NOT DETECTED Final   Candida krusei NOT DETECTED NOT DETECTED Final   Candida parapsilosis NOT DETECTED NOT DETECTED Final   Candida tropicalis NOT DETECTED NOT DETECTED Final    Comment: Performed at [facility], [place] [address] [place], [place] [address]  Culture, blood (Routine x 2)     Status: Abnormal   Collection Time: 05/02/19  7:49 PM   Specimen: BLOOD LEFT HAND  Result Value Ref Range Status   Specimen Description BLOOD LEFT HAND  Final   Special Requests   Final    BOTTLES DRAWN AEROBIC ONLY Blood Culture adequate volume   Culture  Setup Time   Final    GRAM POSITIVE COCCI IN PAIRS AND CHAINS AEROBIC BOTTLE ONLY CRITICAL VALUE NOTED.  VALUE IS CONSISTENT WITH PREVIOUSLY REPORTED AND CALLED VALUE.    Culture (A)  Final    STREPTOCOCCUS GROUP G SUSCEPTIBILITIES PERFORMED ON PREVIOUS CULTURE WITHIN THE LAST 5 DAYS. Performed at [facility], [place] [address] [place], [place] [address]    Report Status 05/05/2019 FINAL  Final  Respiratory Panel by RT PCR (Flu A&B, Covid) - Nasopharyngeal Swab     Status: None   Collection Time: 05/02/19  9:11 PM   Specimen: Nasopharyngeal Swab  Result Value Ref Range Status   SARS  Coronavirus 2 by RT PCR NEGATIVE NEGATIVE Final    Comment: (NOTE) SARS-CoV-2 target nucleic acids are NOT DETECTED. The SARS-CoV-2 RNA is generally detectable in upper respiratoy specimens during the acute phase of infection. The lowest concentration of SARS-CoV-2 viral copies this assay can detect is 131 copies/mL. A negative result does not preclude SARS-Cov-2 infection and should not be used as the sole basis for treatment or other patient management decisions. A negative result may occur with  improper specimen collection/handling, submission of specimen other than nasopharyngeal swab, presence of viral mutation(s) within the areas targeted by this assay, and inadequate number of viral copies (<131 copies/mL). A negative result must be combined with clinical observations, patient history, and epidemiological information. The expected result is Negative. Fact Sheet for Patients:  PinkCheek.be Fact Sheet for Healthcare Providers:  GravelBags.it This test is not yet ap proved or cleared by the [place] FDA and  has been authorized for detection and/or diagnosis of SARS-CoV-2 by FDA under an Emergency Use Authorization (EUA). This EUA will remain  in effect (meaning this test can be used) for the duration of the COVID-19 declaration under Section 564(b)(1) of the Act, 21 U.S.C. section 360bbb-3(b)(1), unless the authorization is terminated or revoked sooner.    Influenza A by PCR NEGATIVE NEGATIVE Final   Influenza B by PCR NEGATIVE NEGATIVE Final    Comment: (NOTE) The Xpert Xpress SARS-CoV-2/FLU/RSV assay is intended as an aid in  the diagnosis of influenza from Nasopharyngeal swab specimens and  should not be used as a sole basis for treatment. Nasal washings and  aspirates are unacceptable for Xpert Xpress SARS-CoV-2/FLU/RSV  testing. Fact Sheet for Patients: PinkCheek.be Fact Sheet  for Healthcare Providers: GravelBags.it This test is not yet approved or cleared by the [place] FDA and  has been authorized for detection and/or diagnosis of SARS-CoV-2 by  FDA under an Emergency Use Authorization (EUA).  This EUA will remain  in effect (meaning this test can be used) for the duration of the  Covid-19 declaration under Section 564(b)(1) of the Act, 21  U.S.C. section 360bbb-3(b)(1), unless the authorization is  terminated or revoked. Performed at [facility], [place] [address], [place], [place] [address]   MRSA PCR Screening     Status: None   Collection Time: 05/03/19  4:41 AM   Specimen: Nasopharyngeal  Result Value Ref Range Status   MRSA by PCR NEGATIVE NEGATIVE Final    Comment:        The GeneXpert MRSA Assay (FDA approved for NASAL specimens only), is one component of a comprehensive MRSA colonization surveillance program. It is not intended to diagnose MRSA infection nor to guide or monitor treatment for MRSA infections. Performed at [facility], [place] [address], [place], [place] [address]   C Difficile Quick Screen w PCR reflex     Status: None   Collection Time: 05/03/19 12:43 PM   Specimen: STOOL  Result Value Ref Range Status   C Diff antigen NEGATIVE NEGATIVE Final   C Diff toxin NEGATIVE NEGATIVE Final   C Diff interpretation No C. difficile detected.  Final    Comment: Performed at [facility], [place] [address] [place], [place] [address]  Gastrointestinal Panel by PCR , Stool     Status: None   Collection Time: 05/03/19 12:43 PM   Specimen: STOOL  Result Value Ref Range Status   Campylobacter species NOT DETECTED NOT DETECTED Final   Plesimonas shigelloides NOT DETECTED NOT DETECTED Final   Salmonella species NOT DETECTED NOT DETECTED Final   Yersinia enterocolitica NOT DETECTED NOT DETECTED Final   Vibrio species NOT DETECTED NOT DETECTED Final   Vibrio cholerae NOT DETECTED NOT DETECTED  Final   Enteroaggregative E coli (EAEC) NOT DETECTED NOT DETECTED Final   Enteropathogenic E coli (EPEC) NOT DETECTED NOT DETECTED Final   Enterotoxigenic E coli (ETEC) NOT DETECTED NOT DETECTED Final   Shiga like toxin producing E coli (STEC) NOT DETECTED NOT DETECTED Final   Shigella/Enteroinvasive E coli (EIEC) NOT DETECTED NOT DETECTED Final   Cryptosporidium NOT DETECTED NOT DETECTED Final   Cyclospora cayetanensis NOT DETECTED NOT DETECTED Final   Entamoeba histolytica NOT DETECTED NOT DETECTED Final   Giardia lamblia NOT DETECTED NOT DETECTED Final   Adenovirus F40/41 NOT DETECTED NOT DETECTED Final   Astrovirus NOT DETECTED NOT DETECTED Final   Norovirus GI/GII NOT DETECTED NOT DETECTED Final   Rotavirus A NOT DETECTED NOT DETECTED Final   Sapovirus (I, II, IV, and V) NOT DETECTED NOT DETECTED Final    Comment: Performed at [facility], [address] [place], [place] [address]  Respiratory Panel by PCR     Status: None   Collection Time: 05/03/19  1:40 PM   Specimen: Nasopharyngeal Swab; Respiratory  Result Value Ref Range Status   Adenovirus NOT DETECTED NOT DETECTED Final   Coronavirus 229E NOT DETECTED NOT DETECTED Final    Comment: (NOTE) The Coronavirus on the Respiratory Panel, DOES NOT test for the novel  Coronavirus (2019 nCoV)    Coronavirus HKU1 NOT DETECTED NOT DETECTED Final   Coronavirus NL63 NOT DETECTED NOT DETECTED Final   Coronavirus OC43 NOT DETECTED NOT DETECTED Final   Metapneumovirus NOT DETECTED NOT DETECTED Final   Rhinovirus / Enterovirus NOT DETECTED NOT DETECTED Final   Influenza A NOT DETECTED NOT DETECTED Final   Influenza B NOT DETECTED NOT  DETECTED Final   Parainfluenza Virus 1 NOT DETECTED NOT DETECTED Final   Parainfluenza Virus 2 NOT DETECTED NOT DETECTED Final   Parainfluenza Virus 3 NOT DETECTED NOT DETECTED Final   Parainfluenza Virus 4 NOT DETECTED NOT DETECTED Final   Respiratory Syncytial Virus NOT DETECTED NOT DETECTED  Final   Bordetella pertussis NOT DETECTED NOT DETECTED Final   Chlamydophila pneumoniae NOT DETECTED NOT DETECTED Final   Mycoplasma pneumoniae NOT DETECTED NOT DETECTED Final    Comment: Performed at [facility], [place] [address] [place], [place] [address]  Culture, blood (routine x 2)     Status: None (Preliminary result)   Collection Time: 05/06/19 11:50 AM   Specimen: BLOOD  Result Value Ref Range Status   Specimen Description BLOOD RIGHT ANTECUBITAL  Final   Special Requests   Final    BOTTLES DRAWN AEROBIC ONLY Blood Culture results may not be optimal due to an inadequate volume of blood received in culture bottles   Culture   Final    NO GROWTH 2 DAYS Performed at [facility], [place] [address] [place], [place] [address]    Report Status PENDING  Incomplete  Culture, blood (routine x 2)     Status: None (Preliminary result)   Collection Time: 05/06/19 11:57 AM   Specimen: BLOOD RIGHT HAND  Result Value Ref Range Status   Specimen Description BLOOD RIGHT HAND  Final   Special Requests   Final    BOTTLES DRAWN AEROBIC AND ANAEROBIC Blood Culture adequate volume   Culture   Final    NO GROWTH 2 DAYS Performed at [facility], [place] [address], [place],  [address]    Report Status PENDING  Incomplete     Terri Piedra, [facility]  05/08/2019  11:02 AM

## 2019-05-08 NOTE — Progress Notes (Signed)
   Patient Status: [facility] - In-pt  Assessment and Plan: Patient in need of venous access.   Cellulitis  Pulmonary arterial HTN- on Remodulin and Sildenofil ______________________________________________________________________   History of Present Illness: [name] is a 59 y.o. female   Pulmonary arterial HTN- on Remodulin and Sildenofil OSA; obese; DM2; CKD Admitted with sepsis +BC 4/28: Strept G Previous [facility] cath removed at admission (Rt chest) Now with [facility] neg 05/06/19    Allergies and medications reviewed.   Review of Systems: A 12 point ROS discussed and pertinent positives are indicated in the HPI above.  All other systems are negative.   Vital Signs: BP 127/71   Pulse 75   Temp 97.6 F (36.4 C) (Axillary)   Resp (!) 23   Wt 279 lb 5.2 oz (126.7 kg)   LMP 02/19/2011   SpO2 95%   BMI 48.70 kg/m   Physical Exam Vitals reviewed.  Cardiovascular:     Rate and Rhythm: Rhythm irregular.  Skin:    General: Skin is warm and dry.  Psychiatric:     Comments: Spoke to son Kaida Games-- consented via phone      Imaging reviewed.   Labs:  COAGS: Recent Labs    05/02/19 1953  INR 1.3*    BMP: Recent Labs    05/05/19 1021 05/05/19 1544 05/06/19 0804 05/06/19 1911  NA 140 140 143 148*  K 3.5 3.9 3.3* 3.5  CL 103 104 106 108  CO2 _0 GLUCOSE 158* 133* 126* 104*  BUN 39* 41* 38* 36*  CALCIUM 8.0* 7.8* 8.3* 8.4*  CREATININE 1.50* 1.54* 1.32* 1.09*  GFRNONAA 38* 37* 45* 56*  GFRAA 44* 43* 52* >60   Scheduled for tunneled central catheter placement in IR Planned for 05/09/19 pts son Legrand Como is aware of procedure benefits and risks including but not limited to Infection; bleeding; vessel damage Agreeable to proceed Consent signed in chart    Electronically Signed: Lavonia Drafts, PA-C 05/08/2019, 2:17 PM   I spent a total of 15 minutes in face to face in clinical consultation, greater than 50% of which was  counseling/coordinating care for venous access.

## 2019-05-09 ENCOUNTER — Inpatient Hospital Stay (HOSPITAL_COMMUNITY): Payer: Medicaid Other

## 2019-05-09 DIAGNOSIS — J9621 Acute and chronic respiratory failure with hypoxia: Secondary | ICD-10-CM | POA: Diagnosis not present

## 2019-05-09 DIAGNOSIS — L03115 Cellulitis of right lower limb: Secondary | ICD-10-CM | POA: Diagnosis not present

## 2019-05-09 DIAGNOSIS — R7881 Bacteremia: Secondary | ICD-10-CM | POA: Diagnosis not present

## 2019-05-09 DIAGNOSIS — A419 Sepsis, unspecified organism: Secondary | ICD-10-CM | POA: Diagnosis not present

## 2019-05-09 HISTORY — PX: IR FLUORO GUIDE CV LINE RIGHT: IMG2283

## 2019-05-09 HISTORY — PX: IR US GUIDE VASC ACCESS RIGHT: IMG2390

## 2019-05-09 LAB — GLUCOSE, CAPILLARY
Glucose-Capillary: 104 mg/dL — ABNORMAL HIGH (ref 70–99)
Glucose-Capillary: 127 mg/dL — ABNORMAL HIGH (ref 70–99)
Glucose-Capillary: 144 mg/dL — ABNORMAL HIGH (ref 70–99)
Glucose-Capillary: 163 mg/dL — ABNORMAL HIGH (ref 70–99)
Glucose-Capillary: 182 mg/dL — ABNORMAL HIGH (ref 70–99)
Glucose-Capillary: 88 mg/dL (ref 70–99)
Glucose-Capillary: 97 mg/dL (ref 70–99)

## 2019-05-09 LAB — CBC
HCT: 39.3 % (ref 36.0–46.0)
Hemoglobin: 12.3 g/dL (ref 12.0–15.0)
MCH: 27.5 pg (ref 26.0–34.0)
MCHC: 31.3 g/dL (ref 30.0–36.0)
MCV: 87.7 fL (ref 80.0–100.0)
Platelets: 141 10*3/uL — ABNORMAL LOW (ref 150–400)
RBC: 4.48 MIL/uL (ref 3.87–5.11)
RDW: 17.2 % — ABNORMAL HIGH (ref 11.5–15.5)
WBC: 13 10*3/uL — ABNORMAL HIGH (ref 4.0–10.5)
nRBC: 0.8 % — ABNORMAL HIGH (ref 0.0–0.2)

## 2019-05-09 LAB — COMPREHENSIVE METABOLIC PANEL
ALT: 13 U/L (ref 0–44)
AST: 23 U/L (ref 15–41)
Albumin: 2.4 g/dL — ABNORMAL LOW (ref 3.5–5.0)
Alkaline Phosphatase: 57 U/L (ref 38–126)
Anion gap: 12 (ref 5–15)
BUN: 25 mg/dL — ABNORMAL HIGH (ref 6–20)
CO2: 31 mmol/L (ref 22–32)
Calcium: 8.4 mg/dL — ABNORMAL LOW (ref 8.9–10.3)
Chloride: 109 mmol/L (ref 98–111)
Creatinine, Ser: 0.73 mg/dL (ref 0.44–1.00)
GFR calc Af Amer: >60 mL/min (ref >60)
GFR calc non Af Amer: >60 mL/min (ref >60)
Glucose, Bld: 111 mg/dL — ABNORMAL HIGH (ref 70–99)
Potassium: 3.1 mmol/L — ABNORMAL LOW (ref 3.5–5.1)
Sodium: 152 mmol/L — ABNORMAL HIGH (ref 135–145)
Total Bilirubin: 0.7 mg/dL (ref 0.3–1.2)
Total Protein: 5.9 g/dL — ABNORMAL LOW (ref 6.5–8.1)

## 2019-05-09 MED ORDER — FENTANYL CITRATE (PF) 100 MCG/2ML IJ SOLN
INTRAMUSCULAR | Status: AC
  Filled 2019-05-09: qty 2

## 2019-05-09 MED ORDER — CHLORHEXIDINE GLUCONATE 4 % EX LIQD
CUTANEOUS | Status: AC
  Filled 2019-05-09: qty 15

## 2019-05-09 MED ORDER — LIDOCAINE HCL 1 % IJ SOLN
INTRAMUSCULAR | Status: AC
  Filled 2019-05-09: qty 20

## 2019-05-09 MED ORDER — FUROSEMIDE 10 MG/ML IJ SOLN
40.0000 mg | Freq: Two times a day (BID) | INTRAMUSCULAR | Status: AC
  Administered 2019-05-09 (×2): 40 mg via INTRAVENOUS
  Filled 2019-05-09 (×2): qty 4

## 2019-05-09 MED ORDER — AMOXICILLIN 500 MG PO CAPS
500.0000 mg | ORAL_CAPSULE | Freq: Three times a day (TID) | ORAL | Status: AC
  Administered 2019-05-09 – 2019-05-15 (×17): 500 mg via ORAL
  Filled 2019-05-09 (×20): qty 1

## 2019-05-09 MED ORDER — MIDAZOLAM HCL 2 MG/2ML IJ SOLN
INTRAMUSCULAR | Status: AC
  Filled 2019-05-09: qty 2

## 2019-05-09 MED ORDER — POTASSIUM CHLORIDE CRYS ER 10 MEQ PO TBCR
20.0000 meq | EXTENDED_RELEASE_TABLET | Freq: Two times a day (BID) | ORAL | Status: AC
  Administered 2019-05-09 (×2): 20 meq via ORAL
  Filled 2019-05-09 (×2): qty 2

## 2019-05-09 MED ORDER — LIDOCAINE HCL (PF) 1 % IJ SOLN
INTRAMUSCULAR | Status: DC | PRN
  Administered 2019-05-09: 10 mL

## 2019-05-09 MED ORDER — FENTANYL CITRATE (PF) 100 MCG/2ML IJ SOLN
25.0000 ug | Freq: Once | INTRAMUSCULAR | Status: AC
  Administered 2019-05-09: 23:00:00 25 ug via INTRAVENOUS
  Filled 2019-05-09: qty 2

## 2019-05-09 NOTE — Progress Notes (Signed)
[name], female   DOB: 04-05-1960, 59 y.o.   MRN: [id number]         [facility]  Date of Admission:  05/02/2019   Total days of antibiotics 8        Day 2 cefazolin         ASSESSMENT: She continues to improve on therapy for right leg cellulitis complicated by group G streptococcal bacteremia.  There is no evidence of endocarditis by medical exam or TTE.  I do not feel that she needs a TEE.  We can safely complete 2 weeks of therapy with oral amoxicillin.  PLAN: 1. Change cefazolin to oral amoxicillin and treat for 6 more days 2. 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 Chlorhexidine Gluconate Cloth  6 each Topical Daily  . enoxaparin (LOVENOX) injection  130 mg Subcutaneous Q12H  . ferrous sulfate  325 mg Oral Q breakfast  . furosemide  40 mg Intravenous BID  . gabapentin  600 mg Oral BID  . insulin aspart  0-20 Units Subcutaneous Q4H  . mouth rinse  15 mL Mouth Rinse BID  . pantoprazole (PROTONIX) IV  40 mg Intravenous QHS  . potassium chloride  20 mEq Oral BID  . pravastatin  20 mg Oral Daily  . sildenafil  20 mg Oral TID  . vitamin B-12  500 mcg Oral Daily   Continuous Infusions: . sodium chloride 10 mL/hr at 05/09/19 0800  .  ceFAZolin (ANCEF) IV Stopped (05/09/19 0536)  . treprostinil (REMODULIN) infusion 81 ng/kg/min (05/08/19 2049)   PRN Meds:.docusate sodium, ondansetron (ZOFRAN) IV, polyethylene glycol   SUBJECTIVE: She is feeling better.  Review of Systems: Review of Systems  Constitutional:  Negative for chills, diaphoresis and fever.    Allergies  Allergen Reactions  . Aspirin     REACTION: airway swelling  . Codeine     REACTION: tingling in lips and hard breathing - had reaction at dentist - states "I can't take certain kinds of codeine" - happened maybe 10 yr ago  . Lisinopril     Cough  . Sulfonamide Derivatives     REACTION: airway swelling  . Latex Rash    OBJECTIVE: Vitals:   05/09/19 0700 05/09/19 0733 05/09/19 0800 05/09/19 0824  BP: 112/66  115/61 115/61  Pulse: 82  84 84  Resp: 17  20 (!) 21  Temp:  98 F (36.7 C)    TempSrc:  Oral    SpO2: 96%  93% 92%  Weight:       Body mass index is 48.78 kg/m.  Physical Exam Constitutional:      Comments: She is more alert and resting quietly in bed watching television.  Cardiovascular:     Rate and Rhythm: Normal rate and regular rhythm.     Heart sounds: No murmur.  Pulmonary:     Effort: Pulmonary effort is normal.     Breath sounds: Normal breath sounds.  Skin:    Comments: Her right lower leg is in an  Ace wrap on her knee.  The erythema of her thigh is fading.     Lab Results Lab Results  Component Value Date   WBC 37.9 (H) 05/06/2019   HGB 12.0 05/06/2019   HCT 38.4 05/06/2019   MCV 89.9 05/06/2019   PLT 181 05/06/2019    Lab Results  Component Value Date   CREATININE 1.09 (H) 05/06/2019   BUN 36 (H) 05/06/2019   NA 148 (H) 05/06/2019   K 3.5 05/06/2019   CL 108 05/06/2019   CO2 29 05/06/2019    Lab Results  Component Value Date   ALT 17 05/02/2019   AST 29 05/02/2019   ALKPHOS 73 05/02/2019   BILITOT 0.8 05/02/2019     Microbiology: Recent Results (from the past 240 hour(s))  Culture, blood (Routine x 2)     Status: Abnormal   Collection Time: 05/02/19  7:30 PM   Specimen: BLOOD LEFT ARM  Result Value Ref Range Status   Specimen Description BLOOD LEFT ARM  Final   Special Requests   Final    BOTTLES DRAWN AEROBIC AND ANAEROBIC Blood Culture adequate volume   Culture   Setup Time   Final    GRAM POSITIVE COCCI IN CHAINS IN BOTH AEROBIC AND ANAEROBIC BOTTLES CRITICAL RESULT CALLED TO, READ BACK BY AND VERIFIED WITH: Criss Rosales PharmD 10:30 05/03/19 ([email or web address]) Performed at [facility], [place] [address] [place], [place] [address]    Culture STREPTOCOCCUS GROUP G (A)  Final   Report Status 05/05/2019 FINAL  Final   Organism ID, Bacteria STREPTOCOCCUS GROUP G  Final      Susceptibility   Streptococcus group g - MIC*    CLINDAMYCIN <=0.25 SENSITIVE Sensitive     AMPICILLIN <=0.25 SENSITIVE Sensitive     ERYTHROMYCIN <=0.12 SENSITIVE Sensitive     VANCOMYCIN 0.5 SENSITIVE Sensitive     CEFTRIAXONE <=0.12 SENSITIVE Sensitive     LEVOFLOXACIN 0.5 SENSITIVE Sensitive     PENICILLIN Value in next row Sensitive      SENSITIVE0.06    * STREPTOCOCCUS GROUP G  Blood Culture ID Panel (Reflexed)     Status: Abnormal   Collection Time: 05/02/19  7:30 PM  Result Value Ref Range Status   Enterococcus species NOT DETECTED NOT DETECTED Final   Listeria monocytogenes NOT DETECTED NOT DETECTED Final   Staphylococcus species NOT DETECTED NOT DETECTED Final   Staphylococcus aureus (BCID) NOT DETECTED NOT DETECTED Final   Streptococcus species DETECTED (A) NOT DETECTED Final    Comment: Not Enterococcus species, Streptococcus agalactiae, Streptococcus pyogenes, or Streptococcus pneumoniae. CRITICAL RESULT CALLED TO, READ BACK BY AND VERIFIED WITH: Criss Rosales PharmD 10:30 05/03/19 ([email or web address])    Streptococcus agalactiae NOT DETECTED NOT DETECTED Final   Streptococcus pneumoniae NOT DETECTED NOT DETECTED Final   Streptococcus pyogenes NOT DETECTED NOT DETECTED Final   Acinetobacter baumannii NOT DETECTED NOT DETECTED Final   Enterobacteriaceae species NOT DETECTED NOT DETECTED Final   Enterobacter cloacae complex NOT DETECTED NOT DETECTED Final   Escherichia coli NOT DETECTED NOT DETECTED Final   Klebsiella oxytoca NOT DETECTED NOT DETECTED Final   Klebsiella pneumoniae NOT  DETECTED NOT DETECTED Final   Proteus species NOT DETECTED NOT DETECTED Final   Serratia marcescens NOT DETECTED NOT DETECTED Final   Haemophilus influenzae NOT DETECTED NOT DETECTED Final   Neisseria meningitidis NOT DETECTED NOT DETECTED Final   Pseudomonas aeruginosa NOT DETECTED NOT DETECTED Final   Candida albicans NOT DETECTED NOT DETECTED Final  Candida glabrata NOT DETECTED NOT DETECTED Final   Candida krusei NOT DETECTED NOT DETECTED Final   Candida parapsilosis NOT DETECTED NOT DETECTED Final   Candida tropicalis NOT DETECTED NOT DETECTED Final    Comment: Performed at [facility], [place] [address], [place], [place] [address]  Culture, blood (Routine x 2)     Status: Abnormal   Collection Time: 05/02/19  7:49 PM   Specimen: BLOOD LEFT HAND  Result Value Ref Range Status   Specimen Description BLOOD LEFT HAND  Final   Special Requests   Final    BOTTLES DRAWN AEROBIC ONLY Blood Culture adequate volume   Culture  Setup Time   Final    GRAM POSITIVE COCCI IN PAIRS AND CHAINS AEROBIC BOTTLE ONLY CRITICAL VALUE NOTED.  VALUE IS CONSISTENT WITH PREVIOUSLY REPORTED AND CALLED VALUE.    Culture (A)  Final    STREPTOCOCCUS GROUP G SUSCEPTIBILITIES PERFORMED ON PREVIOUS CULTURE WITHIN THE LAST 5 DAYS. Performed at [facility], [place] [address] [place], [place] [address]    Report Status 05/05/2019 FINAL  Final  Respiratory Panel by RT PCR (Flu A&B, Covid) - Nasopharyngeal Swab     Status: None   Collection Time: 05/02/19  9:11 PM   Specimen: Nasopharyngeal Swab  Result Value Ref Range Status   SARS Coronavirus 2 by RT PCR NEGATIVE NEGATIVE Final    Comment: (NOTE) SARS-CoV-2 target nucleic acids are NOT DETECTED. The SARS-CoV-2 RNA is generally detectable in upper respiratoy specimens during the acute phase of infection. The lowest concentration of SARS-CoV-2 viral copies this assay can detect is 131 copies/mL. A negative result does not preclude  SARS-Cov-2 infection and should not be used as the sole basis for treatment or other patient management decisions. A negative result may occur with  improper specimen collection/handling, submission of specimen other than nasopharyngeal swab, presence of viral mutation(s) within the areas targeted by this assay, and inadequate number of viral copies (<131 copies/mL). A negative result must be combined with clinical observations, patient history, and epidemiological information. The expected result is Negative. Fact Sheet for Patients:  PinkCheek.be Fact Sheet for Healthcare Providers:  GravelBags.it This test is not yet ap proved or cleared by the [place] FDA and  has been authorized for detection and/or diagnosis of SARS-CoV-2 by FDA under an Emergency Use Authorization (EUA). This EUA will remain  in effect (meaning this test can be used) for the duration of the COVID-19 declaration under Section 564(b)(1) of the Act, 21 U.S.C. section 360bbb-3(b)(1), unless the authorization is terminated or revoked sooner.    Influenza A by PCR NEGATIVE NEGATIVE Final   Influenza B by PCR NEGATIVE NEGATIVE Final    Comment: (NOTE) The Xpert Xpress SARS-CoV-2/FLU/RSV assay is intended as an aid in  the diagnosis of influenza from Nasopharyngeal swab specimens and  should not be used as a sole basis for treatment. Nasal washings and  aspirates are unacceptable for Xpert Xpress SARS-CoV-2/FLU/RSV  testing. Fact Sheet for Patients: PinkCheek.be Fact Sheet for Healthcare Providers: GravelBags.it This test is not yet approved or cleared by the [place] FDA and  has been authorized for detection and/or diagnosis of SARS-CoV-2 by  FDA under an Emergency Use Authorization (EUA). This EUA will remain  in effect (meaning this test can be used) for the duration of the  Covid-19  declaration under Section 564(b)(1) of the Act, 21  U.S.C. section 360bbb-3(b)(1), unless the authorization is  terminated or revoked. Performed at  [facility], [place] [address], [place], [place] [address]   MRSA PCR Screening     Status: None   Collection Time: 05/03/19  4:41 AM   Specimen: Nasopharyngeal  Result Value Ref Range Status   MRSA by PCR NEGATIVE NEGATIVE Final    Comment:        The GeneXpert MRSA Assay (FDA approved for NASAL specimens only), is one component of a comprehensive MRSA colonization surveillance program. It is not intended to diagnose MRSA infection nor to guide or monitor treatment for MRSA infections. Performed at [facility], [place] [address] [place], [place] [address]   C Difficile Quick Screen w PCR reflex     Status: None   Collection Time: 05/03/19 12:43 PM   Specimen: STOOL  Result Value Ref Range Status   C Diff antigen NEGATIVE NEGATIVE Final   C Diff toxin NEGATIVE NEGATIVE Final   C Diff interpretation No C. difficile detected.  Final    Comment: Performed at [facility], [place] [address] [place], [place] [address]  Gastrointestinal Panel by PCR , Stool     Status: None   Collection Time: 05/03/19 12:43 PM   Specimen: STOOL  Result Value Ref Range Status   Campylobacter species NOT DETECTED NOT DETECTED Final   Plesimonas shigelloides NOT DETECTED NOT DETECTED Final   Salmonella species NOT DETECTED NOT DETECTED Final   Yersinia enterocolitica NOT DETECTED NOT DETECTED Final   Vibrio species NOT DETECTED NOT DETECTED Final   Vibrio cholerae NOT DETECTED NOT DETECTED Final   Enteroaggregative E coli (EAEC) NOT DETECTED NOT DETECTED Final   Enteropathogenic E coli (EPEC) NOT DETECTED NOT DETECTED Final   Enterotoxigenic E coli (ETEC) NOT DETECTED NOT DETECTED Final   Shiga like toxin producing E coli (STEC) NOT DETECTED NOT DETECTED Final   Shigella/Enteroinvasive E coli (EIEC) NOT DETECTED NOT DETECTED Final    Cryptosporidium NOT DETECTED NOT DETECTED Final   Cyclospora cayetanensis NOT DETECTED NOT DETECTED Final   Entamoeba histolytica NOT DETECTED NOT DETECTED Final   Giardia lamblia NOT DETECTED NOT DETECTED Final   Adenovirus F40/41 NOT DETECTED NOT DETECTED Final   Astrovirus NOT DETECTED NOT DETECTED Final   Norovirus GI/GII NOT DETECTED NOT DETECTED Final   Rotavirus A NOT DETECTED NOT DETECTED Final   Sapovirus (I, II, IV, and V) NOT DETECTED NOT DETECTED Final    Comment: Performed at [facility], [place] [place], [place] [address]  Respiratory Panel by PCR     Status: None   Collection Time: 05/03/19  1:40 PM   Specimen: Nasopharyngeal Swab; Respiratory  Result Value Ref Range Status   Adenovirus NOT DETECTED NOT DETECTED Final   Coronavirus 229E NOT DETECTED NOT DETECTED Final    Comment: (NOTE) The Coronavirus on the Respiratory Panel, DOES NOT test for the novel  Coronavirus (2019 nCoV)    Coronavirus HKU1 NOT DETECTED NOT DETECTED Final   Coronavirus NL63 NOT DETECTED NOT DETECTED Final   Coronavirus OC43 NOT DETECTED NOT DETECTED Final   Metapneumovirus NOT DETECTED NOT DETECTED Final   Rhinovirus / Enterovirus NOT DETECTED NOT DETECTED Final   Influenza A NOT DETECTED NOT DETECTED Final   Influenza B NOT DETECTED NOT DETECTED Final   Parainfluenza Virus 1 NOT DETECTED NOT DETECTED Final   Parainfluenza Virus 2 NOT DETECTED NOT DETECTED Final   Parainfluenza Virus 3 NOT DETECTED NOT DETECTED Final   Parainfluenza Virus 4 NOT DETECTED NOT DETECTED Final  Respiratory Syncytial Virus NOT DETECTED NOT DETECTED Final   Bordetella pertussis NOT DETECTED NOT DETECTED Final   Chlamydophila pneumoniae NOT DETECTED NOT DETECTED Final   Mycoplasma pneumoniae NOT DETECTED NOT DETECTED Final    Comment: Performed at [facility], [place] [address] [place], [place] [address]  Culture, blood (routine x 2)     Status: None (Preliminary result)   Collection Time:  05/06/19 11:50 AM   Specimen: BLOOD  Result Value Ref Range Status   Specimen Description BLOOD RIGHT ANTECUBITAL  Final   Special Requests   Final    BOTTLES DRAWN AEROBIC ONLY Blood Culture results may not be optimal due to an inadequate volume of blood received in culture bottles   Culture   Final    NO GROWTH 3 DAYS Performed at [facility], [place] [address], [place], [place] [address]    Report Status PENDING  Incomplete  Culture, blood (routine x 2)     Status: None (Preliminary result)   Collection Time: 05/06/19 11:57 AM   Specimen: BLOOD RIGHT HAND  Result Value Ref Range Status   Specimen Description BLOOD RIGHT HAND  Final   Special Requests   Final    BOTTLES DRAWN AEROBIC AND ANAEROBIC Blood Culture adequate volume   Culture   Final    NO GROWTH 3 DAYS Performed at [facility], [place] [address] [place], [place] [address]    Report Status PENDING  Incomplete    Michel Bickers, MD [facility] [place] [facility] [phone] pager   [phone] cell 05/09/2019, 9:24 AM

## 2019-05-09 NOTE — Progress Notes (Signed)
eLink Physician-Brief Progress Note Patient Name: [name] DOB: 30-Nov-1960 MRN: [id number]   Date of Service  05/09/2019  HPI/Events of Note  Loose stools X 4. Request for Flexiseal.  eICU Interventions  Will order: 1. Place Flexiseal.     Intervention Category Major Interventions: Other:  Sumi Lye Cornelia Copa 05/09/2019, 2:15 AM

## 2019-05-09 NOTE — Procedures (Signed)
  Procedure: Right Internal Jugular tunneled double lumen central venous catheter placement   EBL:   minimal Complications:  none immediate  See full dictation in [facility].  Dillard Cannon MD Main # [phone] Pager  [phone]

## 2019-05-09 NOTE — Progress Notes (Signed)
Advanced Heart Failure Rounding Note   Subjective:    Was on BIPAP overnight. Remains on high-flow O2. Feels ok. Still SOB with movement.  Remains afebrile.  No labs today  Objective:   Weight Range:  Vital Signs:   Temp:  [97.6 F (36.4 C)-98.4 F (36.9 C)] 98 F (36.7 C) (05/05 0733) Pulse Rate:  [74-101] 82 (05/05 0700) Resp:  [16-29] 17 (05/05 0700) BP: (95-149)/(53-85) 112/66 (05/05 0700) SpO2:  [88 %-96 %] 96 % (05/05 0700) FiO2 (%):  [60 %-85 %] 60 % (05/05 0400) Weight:  [126.9 kg] 126.9 kg (05/05 0345) Last BM Date: 05/08/19  Weight change: Filed Weights   05/05/19 0428 05/08/19 0500 05/09/19 0345  Weight: 134.7 kg 126.7 kg 126.9 kg    Intake/Output:   Intake/Output Summary (Last 24 hours) at 05/09/2019 0811 Last data filed at 05/09/2019 0700 Gross per 24 hour  Intake 805.61 ml  Output 351 ml  Net 454.61 ml     Physical Exam: General:  Chronically ill appearing, morbidly obese. Sitting in bed on HFNC HEENT: normal flushed Neck: supple. LIJ line  Carotids 2+ bilat; no bruits. No lymphadenopathy or thryomegaly appreciated. Cor: PMI nondisplaced. Regular rate & rhythm. 2/6 TR Lungs: coarse Abdomen: obese soft, nontender, nondistended. No hepatosplenomegaly. No bruits or masses. Good bowel sounds. Extremities: no cyanosis, clubbing, rash, 1-2+ edema + severe RLE erythema Neuro: alert & orientedx3, cranial nerves grossly intact. moves all 4 extremities w/o difficulty. Affect pleasant   Telemetry: NSR 80s Personally reviewed   Labs: Basic Metabolic Panel: Recent Labs  Lab 05/03/19 0450 05/03/19 0450 05/04/19 1022 05/04/19 1022 05/04/19 1452 05/05/19 0514 05/05/19 1021 05/05/19 1021 05/05/19 1544 05/06/19 0804 05/06/19 1911  NA 139   < > 138  --    < > 140 140  --  140 143 148*  K 5.0   < > 3.3*  --    < > 3.4* 3.5  --  3.9 3.3* 3.5  CL 103   < > 103  --   --   --  103  --  104 106 108  CO2 25   < > 24  --   --   --  27  --  _0 GLUCOSE 132*   < > 148*  --   --   --  158*  --  133* 126* 104*  BUN 18   < > 30*  --   --   --  39*  --  41* 38* 36*  CREATININE 1.35*   < > 1.26*  --   --   --  1.50*  --  1.54* 1.32* 1.09*  CALCIUM 7.8*   < > 7.5*   < >  --   --  8.0*   < > 7.8* 8.3* 8.4*  MG 1.5*  --  2.2  --   --   --   --   --   --   --   --   PHOS 3.8  --   --   --   --   --   --   --   --   --   --    < > = values in this interval not displayed.    Liver Function Tests: Recent Labs  Lab 05/02/19 1953  AST 29  ALT 17  ALKPHOS 73  BILITOT 0.8  PROT 7.5  ALBUMIN 3.7   No results for input(s): LIPASE, AMYLASE in  the last 168 hours. No results for input(s): AMMONIA in the last 168 hours.  CBC: Recent Labs  Lab 05/02/19 1953 05/03/19 0106 05/03/19 0450 05/03/19 0450 05/04/19 1022 05/04/19 1452 05/04/19 2154 05/05/19 0039 05/05/19 0514 05/05/19 1021 05/06/19 0804  WBC 20.0*  --  33.0*  --  45.5*  --   --   --   --  45.3* 37.9*  NEUTROABS 18.4*  --   --   --   --   --   --   --   --   --   --   HGB 14.1   < > 14.3   < > 13.7   < > 15.6* 15.6* 15.3* 12.7 12.0  HCT 44.8   < > 45.6   < > 43.4   < > 46.0 46.0 45.0 41.2 38.4  MCV 88.2  --  87.9  --  90.2  --   --   --   --  91.8 89.9  PLT 412*  --  435*  --  295  --   --   --   --  227 181   < > = values in this interval not displayed.    Cardiac Enzymes: No results for input(s): CKTOTAL, CKMB, CKMBINDEX, TROPONINI in the last 168 hours.  BNP: BNP (last 3 results) Recent Labs    06/15/18 0744 05/02/19 2350 05/07/19 1112  BNP 49.0 441.1* 325.9*    ProBNP (last 3 results) No results for input(s): PROBNP in the last 8760 hours.    Other results:  Imaging: No results found.   Medications:     Scheduled Medications: . Chlorhexidine Gluconate Cloth  6 each Topical Daily  . enoxaparin (LOVENOX) injection  130 mg Subcutaneous Q12H  . ferrous sulfate  325 mg Oral Q breakfast  . gabapentin  600 mg Oral BID  . insulin aspart  0-20 Units  Subcutaneous Q4H  . mouth rinse  15 mL Mouth Rinse BID  . pantoprazole (PROTONIX) IV  40 mg Intravenous QHS  . pravastatin  20 mg Oral Daily  . sildenafil  20 mg Oral TID  . vitamin B-12  500 mcg Oral Daily    Infusions: . sodium chloride 10 mL/hr at 05/09/19 0700  .  ceFAZolin (ANCEF) IV Stopped (05/09/19 0536)  . dexmedetomidine (PRECEDEX) IV infusion Stopped (05/09/19 0504)  . treprostinil (REMODULIN) infusion 81 ng/kg/min (05/08/19 2049)    PRN Medications: docusate sodium, ondansetron (ZOFRAN) IV, polyethylene glycol   Assessment/Plan:   1. Shock, septic - Likely source lower extremity cellulitis. Also ? Possible PNA. She also had hickman catheter in R upper chest (has been removed) now w/ new LIJ CVC. Blood Cultures + for Strep Group G - on abx w/ ceftriaxone. AF/ WBC improving - Off pressors - f/u cx negative. Plan to replace Hickman today with IR - ideally would get TEE to evaluate for endocarditis but likely respiratory status won't tolerate. ID planning 14 days abx. If we are unable to safely do TEE should we lengthen abx course  2. LE cellulitis - Seen by ID feel that it is improving but skin changes look like thermal burn.  - elevate leg as tolerated.  - no change  3. A/C Hypoxic Respiratory Failure - Remains off Biapp. On HF [place]. Stil ltenuous - diurese as tolerated - weight down 15 pounds but still about 20 pounds up from baseline (260 pounds) - Lasix on hold due to hypernatremia but no labs for several days.  -  will get labs this am and start lasix 40 IV bid today  4. NSTEMI  -Trend 334>[id number]> 2856. Suspect demand ischemia - 2015 had myoview In EF 78% and no ischemia  - Not having chest pain. EF stable on echo - can consider cath as course determines. Currently not cath candidate  5. Severe PAH with Cor Pulmonale: - On IV remodulin with [facility]. Need to continue - Sildenafil resumed 5/4 - Pending replacement of Hickman today  6. PAF - in NSR. On  lovenox. Can switch back to NOAC soon  7. Acute on chronic diastolic HF - diurese as tolerated - assessment of volume status difficult given super morbid obesity - diuretics held today for hypernatremia. Still up about 20 pounds. Will resume IV lasix. Check labs. Plan as above.     Length of Stay: 7   Glori Bickers MD 05/09/2019, 8:11 AM  Advanced Heart Failure Team Pager [phone] (M-F; [place])  Please contact [facility] for night-coverage after hours (4p -7a ) and weekends on [email or web address]

## 2019-05-09 NOTE — Progress Notes (Signed)
NAME:  [name], MRN:  [id number], DOB:  1960/08/31, LOS: 7 ADMISSION DATE:  05/02/2019, CONSULTATION DATE:  05/02/19 REFERRING MD:  Laverta Baltimore, CHIEF COMPLAINT:  Dypsnea   Brief History   [name] is a 59 y.o. female with a pertinent PMH of PAH on Remodulin and Sildenafil (Mean PAP 54, PCW 12), OSA, morbid obesity, DM2, CKD, who presented with fever and productive cough and was admitted with sepsis 2/2 CAP and possible RLE cellulitis along with acute on chronic hypoxic respiratory failure.   History of present illness   [name] is a 59 y.o. female who has a PMH including but not limited to [name] on treprostinol, CHF, PAF on xarelto, Morbid obesity (see "past medical history" for rest).  She presented to [facility]/28 with fever and productive cough with associated diarrhea and generalized weakness.  She states diarrhea has occurred "for a few days" but over the past 1 - 2 days, she also began to develop chills and generalized weakness.  Denies chest pain, N/V, abd pain, myalgias.  In ED, she was found to be hypoxic to the 80's despite her baseline of 4L O2.  She was increased to 8L with improvement in sats to low 90's.  She was also hypotensive to the 70's SBP.  She was started on gentle IVF hydration given her hx of PAH and current respiratory failure.  CXR showed possible retrocardiac infiltrate and she was febrile to 103F. She was also found to have findings c/w RLE cellulitis (has venous stasis change to BLE's; however, RLE warmer compared to LLE).  PCCM called for admission.  Past Medical History  has ANEMIA, B12 DEFICIENCY; Obstructive sleep apnea; GERD; Obesity; Diabetes ([place]); Hyperlipidemia; Allergic rhinitis; Right heart failure, NYHA class 3 ([place]); Pulmonary hypertension ([place]); Atrial flutter ([place]); Acute on chronic diastolic (congestive) heart failure ([place]); Peripheral neuropathy ([place]); Healthcare maintenance; Rash; Postmenopausal vaginal bleeding; Lower extremity edema; Ovarian  cyst; Hematoma of arm, left, subsequent encounter; Abdominal muscle strain, initial encounter; Acute on chronic respiratory failure ([place]); CAP (community acquired pneumonia); Atelectasis; Urinary incontinence; Bleeding hemorrhoid; and Vaginal itching on their problem list.  [name] [facility] Events   4/28 > admit started on HFNC and transitioned to NIPPV 2/2 to desaturation   Consults:  Heart Failure  Procedures:  4/28 Art line pending> 4/29: CVC >> pulled out 4/30 5/1: infected hickman removed by IR.   Significant Diagnostic Tests:  4/28 CXR > possible retrocardiac infiltrate.  Micro Data:  Flu 4/28 > neg. COVID 4/28 > neg. Blood 4/28 > strep G RVP 4/28 > negative Blood 5/02>negative  Antimicrobials:  Vanc 4/28 > 4/29 Ceftriaxone 4/28 > 4/30 Azithromycin 4/28 >   Interim history/subjective:  Gradually improving from mental status standpoint. Tolerated bipap overnight. Off precedex.   Objective   Blood pressure 115/61, pulse 84, temperature 98 F (36.7 C), temperature source Oral, resp. rate (!) 21, weight 126.9 kg, last menstrual period 02/19/2011, SpO2 92 %. CVP:  [10 mmHg-13 mmHg] 13 mmHg  FiO2 (%):  [60 %-85 %] 60 %   Intake/Output Summary (Last 24 hours) at 05/09/2019 0836 Last data filed at 05/09/2019 0800 Gross per 24 hour  Intake 818.88 ml  Output 351 ml  Net 467.88 ml   Filed Weights   05/05/19 0428 05/08/19 0500 05/09/19 0345  Weight: 134.7 kg 126.7 kg 126.9 kg    Examination: Gen: Morbidly obese female, on nasal cannula. Diffusely volume overloaded Neck: JVD difficult to assess 2/2 body habitus, left neck central  line Heart: RRR, S1, S2, no M/R/G, no chest wall tenderness.  Lungs: Distant breath sounds, no wheezes Abdomen: distended, soft Extremities: no clubbing, cyanosis. bilateral lower extremities have 2+ edema to the knees; pulses are +2 in bilateral upper and lower extremities Skin: trunk face and upper extremities are flushed, venous stasis  changes bilaterally;  warmth and erythema in R leg  Neuro: awake alert talking and following commands  Resolved Hospital Problem list   Septic/cardiogenic shock Elevated troponin  Assessment & Plan:  Acute on chronic hypoxic and hypercarbic respiratory failure: Likely multifactorial but incited by CAP/bacteremia in setting of severe underlying PAH. Blood cultures grew strep spp, as below. We will continue supplemental O2 as needed to maintain SpO2 > 88%.  - Continue Ceftriaxone  - PAH, CAP mgmt as below  - suspect she will need nocturnal bipap   Severe PAH  HFpEF with cor pulmonale - on Remodulin and sildenafil and followed by Dr. Haroldine Laws and Dr. Gilles Chiquito at [facility]. Home Hickman removed for bacteremia.  - Heart failure consulted, appreciate recs.  - Continue home Remodulin.  Sildenafil resumed per cardio now that shock is resolved.  - resume diuretics today - suspect she still has some fluid to go - strict I/Os -on full dose AC with lovenox.  (on xarelto at home)  - plan is for tunneled line placement today now that repeat cultures are negative  Strep G bacteremia  CAP  RLE cellulitis:  Continue ceftriaxone TTE did not show evidence of vegetations. Consulted ID on 4/30 will need TEE when stable.  - ID consulted, appreciate recs - possible TEE prior to discharge. Cultures have cleared   Paroxysmal Atrial Fib: Long h/o PAF on Xarelto and compliant at home. No bleeding signs/sx. Pt has not been in AF during this hospitalization.  - Lovenox in lieu of home Xarelto   FEN: Pt had severe hypokalemia to <2.7 on presentation, presumed 2/2 diarrhea. . - repeat labs today. Suspect she will need to be replaced  Type 2 Diabetes Mellitus: Non insulin dependent, on metformin.  - SSI. - Hold home metformin.  Acute metabolic encephalopathy - improving gradually with treatment of sepsis and hypercarbia  Best practice:  Diet: NPO procedure. She can resume diet once back from IR.    Pain/Anxiety/Delirium protocol (if indicated): N/A. VAP protocol (if indicated): N/A. DVT prophylaxis: SCD's / Lovenox. GI prophylaxis: N/A. Glucose control: SSI. Mobility: Bedrest. Code Status: Limited code - no intubation but OK with CPR in the event of arrest. Disposition: ICU.  The patient is critically ill with multiple organ systems failure and requires high complexity decision making for assessment and support, frequent evaluation and titration of therapies, application of advanced monitoring technologies and extensive interpretation of multiple databases.   Critical Care Time devoted to patient care services described in this note is 36 minutes. This time reflects time of care of this [place] . This critical care time does not reflect separately billable procedures or procedure time, teaching time or supervisory time of PA/NP/Med student/Med Resident etc but could involve care discussion time.  [facility] 05/05/2019 9:43 AM  Pager: [phone] After hours pager: [phone]   Labs   CBC: Recent Labs  Lab 05/02/19 1953 05/03/19 0106 05/03/19 0450 05/03/19 0450 05/04/19 1022 05/04/19 1452 05/04/19 2154 05/05/19 0039 05/05/19 0514 05/05/19 1021 05/06/19 0804  WBC 20.0*  --  33.0*  --  45.5*  --   --   --   --  45.3* 37.9*  NEUTROABS 18.4*  --   --   --   --   --   --   --   --   --   --   HGB 14.1   < > 14.3   < > 13.7   < > 15.6* 15.6* 15.3* 12.7 12.0  HCT 44.8   < > 45.6   < > 43.4   < > 46.0 46.0 45.0 41.2 38.4  MCV 88.2  --  87.9  --  90.2  --   --   --   --  91.8 89.9  PLT 412*  --  435*  --  295  --   --   --   --  227 181   < > = values in this interval not displayed.    Basic Metabolic Panel: Recent Labs  Lab 05/03/19 0450 05/03/19 0450 05/04/19 1022 05/04/19 1452 05/05/19 0514 05/05/19 1021 05/05/19 1544 05/06/19 0804 05/06/19 1911  NA 139   < > 138   < > 140 140 140 143 148*  K 5.0    < > 3.3*   < > 3.4* 3.5 3.9 3.3* 3.5  CL 103   < > 103  --   --  103 104 106 108  CO2 25   < > 24  --   --  _0 GLUCOSE 132*   < > 148*  --   --  158* 133* 126* 104*  BUN 18   < > 30*  --   --  39* 41* 38* 36*  CREATININE 1.35*   < > 1.26*  --   --  1.50* 1.54* 1.32* 1.09*  CALCIUM 7.8*   < > 7.5*  --   --  8.0* 7.8* 8.3* 8.4*  MG 1.5*  --  2.2  --   --   --   --   --   --   PHOS 3.8  --   --   --   --   --   --   --   --    < > = values in this interval not displayed.   GFR: Estimated Creatinine Clearance: 74.5 mL/min (A) (by C-G formula based on SCr of 1.09 mg/dL (H)). Recent Labs  Lab 05/02/19 1953 05/02/19 2140 05/03/19 0450 05/03/19 1506 05/04/19 1022 05/05/19 1021 05/05/19 1232 05/06/19 0804  WBC   < >  --  33.0*  --  45.5* 45.3*  --  37.9*  LATICACIDVEN  --  3.1*  --  2.7*  --  1.2 0.9  --    < > = values in this interval not displayed.    Liver Function Tests: Recent Labs  Lab 05/02/19 1953  AST 29  ALT 17  ALKPHOS 73  BILITOT 0.8  PROT 7.5  ALBUMIN 3.7   No results for input(s): LIPASE, AMYLASE in the last 168 hours. No results for input(s): AMMONIA in the last 168 hours.  ABG    Component Value Date/Time   PHART 7.216 (L) 05/05/2019 0514   PCO2ART 71.3 (HH) 05/05/2019 0514   PO2ART 79 (L) 05/05/2019 0514   HCO3 29.0 (H) 05/05/2019 0514   TCO2 31 05/05/2019 0514   ACIDBASEDEF 1.0 05/05/2019 0514   O2SAT 76.9 05/05/2019 1021     Coagulation Profile: Recent Labs  Lab 05/02/19 1953  INR 1.3*    Cardiac Enzymes: No results for input(s): CKTOTAL, CKMB, CKMBINDEX, TROPONINI in the last 168  hours.  HbA1C: Hemoglobin A1C  Date/Time Value Ref Range Status  04/09/2019 01:48 PM 5.5 4.0 - 5.6 % Final  07/11/2018 04:18 PM 6.1 (A) 4.0 - 5.6 % Final   Hgb A1c MFr Bld  Date/Time Value Ref Range Status  10/14/2010 10:41 AM 13.5 (H) <5.7 % Final    Comment:                                                                           According  to the ADA Clinical Practice Recommendations for 2011, when HbA1c is used as a screening test:     >=6.5%   Diagnostic of Diabetes Mellitus            (if abnormal result is confirmed)   5.7-6.4%   Increased risk of developing Diabetes Mellitus   References:Diagnosis and Classification of Diabetes Mellitus,Diabetes PPJK,9326,71(IWPYK 1):S62-S69 and Standards of Medical Care in         Diabetes - 2011,Diabetes DXIP,3825,05 (Suppl 1):S11-S61.    CBG: Recent Labs  Lab 05/08/19 1554 05/08/19 1959 05/09/19 0002 05/09/19 0409 05/09/19 0731  GLUCAP 100* 141* 127* 104* 97

## 2019-05-09 NOTE — Progress Notes (Signed)
[facility] Progress Note Patient Name: TOYIA JELINEK DOB: [birth year] MRN: [id number]   Date of Service  05/09/2019  HPI/Events of Note  Patient c/o bilateral leg pain d/t cellulitis.  eICU Interventions  Will order: 1. Fentanyl 50 mcg IV X 1 now.      Intervention Category Major Interventions: Other:  Lysle Dingwall 05/09/2019, 11:17 PM

## 2019-05-10 ENCOUNTER — Inpatient Hospital Stay (HOSPITAL_COMMUNITY): Payer: Medicaid Other

## 2019-05-10 DIAGNOSIS — J9622 Acute and chronic respiratory failure with hypercapnia: Secondary | ICD-10-CM | POA: Diagnosis not present

## 2019-05-10 DIAGNOSIS — J9621 Acute and chronic respiratory failure with hypoxia: Secondary | ICD-10-CM | POA: Diagnosis not present

## 2019-05-10 DIAGNOSIS — L03115 Cellulitis of right lower limb: Secondary | ICD-10-CM | POA: Diagnosis not present

## 2019-05-10 DIAGNOSIS — I5033 Acute on chronic diastolic (congestive) heart failure: Secondary | ICD-10-CM | POA: Diagnosis not present

## 2019-05-10 LAB — BASIC METABOLIC PANEL
Anion gap: 10 (ref 5–15)
BUN: 15 mg/dL (ref 6–20)
CO2: 32 mmol/L (ref 22–32)
Calcium: 8.2 mg/dL — ABNORMAL LOW (ref 8.9–10.3)
Chloride: 107 mmol/L (ref 98–111)
Creatinine, Ser: 0.67 mg/dL (ref 0.44–1.00)
GFR calc Af Amer: >60 mL/min (ref >60)
GFR calc non Af Amer: >60 mL/min (ref >60)
Glucose, Bld: 153 mg/dL — ABNORMAL HIGH (ref 70–99)
Potassium: 3.2 mmol/L — ABNORMAL LOW (ref 3.5–5.1)
Sodium: 149 mmol/L — ABNORMAL HIGH (ref 135–145)

## 2019-05-10 LAB — GLUCOSE, CAPILLARY
Glucose-Capillary: 109 mg/dL — ABNORMAL HIGH (ref 70–99)
Glucose-Capillary: 116 mg/dL — ABNORMAL HIGH (ref 70–99)
Glucose-Capillary: 117 mg/dL — ABNORMAL HIGH (ref 70–99)
Glucose-Capillary: 120 mg/dL — ABNORMAL HIGH (ref 70–99)
Glucose-Capillary: 174 mg/dL — ABNORMAL HIGH (ref 70–99)
Glucose-Capillary: 89 mg/dL (ref 70–99)

## 2019-05-10 LAB — MAGNESIUM: Magnesium: 2 mg/dL (ref 1.7–2.4)

## 2019-05-10 MED ORDER — POTASSIUM CHLORIDE CRYS ER 20 MEQ PO TBCR
40.0000 meq | EXTENDED_RELEASE_TABLET | Freq: Once | ORAL | Status: AC
  Administered 2019-05-10: 40 meq via ORAL
  Filled 2019-05-10: qty 2

## 2019-05-10 MED ORDER — POTASSIUM CHLORIDE CRYS ER 20 MEQ PO TBCR
40.0000 meq | EXTENDED_RELEASE_TABLET | Freq: Once | ORAL | Status: AC
  Administered 2019-05-10: 18:00:00 40 meq via ORAL
  Filled 2019-05-10: qty 2

## 2019-05-10 MED ORDER — FUROSEMIDE 10 MG/ML IJ SOLN
40.0000 mg | Freq: Once | INTRAMUSCULAR | Status: AC
  Administered 2019-05-10: 18:00:00 40 mg via INTRAVENOUS
  Filled 2019-05-10: qty 4

## 2019-05-10 MED ORDER — RIVAROXABAN 20 MG PO TABS
20.0000 mg | ORAL_TABLET | Freq: Every day | ORAL | Status: DC
  Administered 2019-05-10 – 2019-05-12 (×3): 20 mg via ORAL
  Filled 2019-05-10 (×3): qty 1

## 2019-05-10 NOTE — Evaluation (Signed)
Occupational Therapy Evaluation Patient Details Name: [name] MRN: [id number] DOB: [birth year] Today's Date: 05/10/2019    History of Present Illness 59 y/o female with h/o PAH on pulmonary vasodilators, peripheral neuropathy, rurinary incontinence admitted on 4/28 with severe sepsis in the setting of pneumonia and cellulitis.   Clinical Impression   Pt was ambulating in her home without a device and outside with a rollator. She was independent in ADL, meal prep and household activities and her son got her groceries for her. Pt uses 4L 02 at baseline. Pt presents with generalized weakness, decreased activity tolerance and impaired standing balance. She requires set up to max assist for ADL. Recommending ST rehab in SNF prior to return home with her son who works. Will follow acutely.    Follow Up Recommendations  SNF;Supervision/Assistance - 24 hour    Equipment Recommendations       Recommendations for Other Services       Precautions / Restrictions Precautions Precautions: Fall Restrictions Weight Bearing Restrictions: No      Mobility Bed Mobility Overal bed mobility: Needs Assistance Bed Mobility: Supine to Sit     Supine to sit: Min assist;HOB elevated     General bed mobility comments: assisted R LE and trunk to come up and forward.  Transfers Overall transfer level: Needs assistance Equipment used: 2 person hand held assist Transfers: Sit to/from [facility] Sit to Stand: Min assist Stand pivot transfers: Min assist;+2 safety/equipment       General transfer comment: cues for hand placement and assist more forward than boost.    Balance Overall balance assessment: Needs assistance Sitting-balance support: No upper extremity supported;Feet supported Sitting balance-Leahy Scale: Fair     Standing balance support: Single extremity supported;Bilateral upper extremity supported Standing balance-Leahy Scale: Poor(to fair soon) Standing  balance comment: very light assist with static standing                            ADL either performed or assessed with clinical judgement   ADL Overall ADL's : Needs assistance/impaired Eating/Feeding: Set up;Sitting   Grooming: Set up;Sitting   Upper Body Bathing: Moderate assistance;Sitting   Lower Body Bathing: Maximal assistance;Sit to/from stand   Upper Body Dressing : Minimal assistance;Sitting   Lower Body Dressing: Maximal assistance;Sit to/from stand   Toilet Transfer: Minimal assistance;+2 for safety/equipment;Stand-pivot   Toileting- Clothing Manipulation and Hygiene: Maximal assistance;Sit to/from stand               Vision Baseline Vision/History: Wears glasses Wears Glasses: Reading only Patient Visual Report: No change from baseline       Perception     Praxis      Pertinent Vitals/Pain       Hand Dominance Right   Extremity/Trunk Assessment Upper Extremity Assessment Upper Extremity Assessment: Generalized weakness   Lower Extremity Assessment Lower Extremity Assessment: Defer to PT evaluation       Communication Communication Communication: No difficulties   Cognition Arousal/Alertness: Awake/alert Behavior During Therapy: WFL for tasks assessed/performed Overall Cognitive Status: History of cognitive impairments - at baseline                                     General Comments       Exercises     Shoulder Instructions      Home Living Family/patient expects to be discharged  to:: Private residence Living Arrangements: Children(son and his roommate) Available Help at Discharge: Family;Available PRN/intermittently(son works) Type of Home: House Home Access: Stairs to enter [employer] of Steps: 4 Entrance Stairs-Rails: None Home Layout: One level     Bathroom Shower/Tub: Corporate investment banker: Standard     Home Equipment: Environmental consultant - 4 wheels;Cane - single  point;Grab bars - tub/shower   Additional Comments: oxygen 4 L O2 [place] and baseline      Prior Functioning/Environment Level of Independence: Independent with assistive device(s)        Comments: walks with rollator when she leaves her home, no device inside, independent in ADL and IADL        OT Problem List: Decreased strength;Decreased activity tolerance;Impaired balance (sitting and/or standing);Pain;Obesity;Decreased knowledge of use of DME or AE      OT Treatment/Interventions: Self-care/ADL training;Energy conservation;DME and/or AE instruction;Patient/family education;Balance training;Therapeutic activities    OT Goals(Current goals can be found in the care plan section) Acute Rehab OT Goals Patient Stated Goal: I really want to go to a nursing home to get independent and stronger, b/c (my son works and can't be off to help me.) OT Goal Formulation: With patient Potential to Achieve Goals: Good ADL Goals Pt Will Perform Grooming: with supervision;standing Pt Will Perform Upper Body Dressing: with set-up;sitting Pt Will Perform Lower Body Dressing: with supervision;sitting/lateral leans;with adaptive equipment Pt Will Transfer to Toilet: with supervision;ambulating;bedside commode Pt Will Perform Toileting - Clothing Manipulation and hygiene: with supervision;sit to/from stand Additional ADL Goal #1: Pt will use energy conservation and pursed lip breathing techniques during ADL with min verbal cues.  OT Frequency: Min 2X/week   Barriers to D/C:            Co-evaluation              AM-PAC OT "6 Clicks" Daily Activity     Outcome Measure Help from another person eating meals?: None Help from another person taking care of personal grooming?: A Little Help from another person toileting, which includes using toliet, bedpan, or urinal?: A Lot Help from another person bathing (including washing, rinsing, drying)?: A Lot Help from another person to put on and taking  off regular upper body clothing?: A Little Help from another person to put on and taking off regular lower body clothing?: A Lot 6 Click Score: 16   End of Session Equipment Utilized During Treatment: Oxygen(sats dropped briefly on 70% heated HFNC) Nurse Communication: Mobility status  Activity Tolerance: Treatment limited secondary to medical complications (Comment)(on heated HFNC) Patient left: in chair;with call bell/phone within reach  OT Visit Diagnosis: Other abnormalities of gait and mobility (R26.89);Unsteadiness on feet (R26.81);Pain;Muscle weakness (generalized) (M62.81)                Time: 6389-3734 OT Time Calculation (min): 38 min Charges:  OT General Charges $OT Visit: 1 Visit OT Evaluation $OT Eval Moderate Complexity: 1 Mod  Nestor Lewandowsky, OTR/L Acute Rehabilitation Services Pager: [phone] Office: [phone]  Malka So 05/10/2019, 3:28 PM

## 2019-05-10 NOTE — Progress Notes (Signed)
Converted patient from remodulin through alaris pump back to home CADD pump.  Was at bedside with RN Gaspar Skeeters for exchange.  Patient did exchange  Rate of 40 mL/hr 24 h was verified Volume of 100 mL  Plan: Exchange remodulin cartridge around 1200 on Sat 5/8  Barth Kirks, PharmD, BCPS, BCCCP Clinical Pharmacist [phone]  Please check AMION for all [place] numbers  05/10/2019 2:44 PM

## 2019-05-10 NOTE — Evaluation (Addendum)
Physical Therapy Evaluation Patient Details Name: [name] MRN: [id number] DOB: October 07, 1960 Today's Date: 05/10/2019   History of Present Illness  59 y/o female with h/o PAH on pulmonary vasodilators, peripheral neuropathy, rurinary incontinence admitted on 4/28 with severe sepsis in the setting of pneumonia and cellulitis.  Clinical Impression  Pt admitted with/for sepsis with PNA and bil LE cellulitis.  Pt needing minimal assist for basic mobility, but limited somewhat by pulmonary status and tubes for HFNC.Marland Kitchen  Pt currently limited functionally due to the problems listed. ( See problems list.)   Pt will benefit from PT to maximize function and safety in order to get ready for next venue listed below.     Follow Up Recommendations      Equipment Recommendations       Recommendations for Other Services       Precautions / Restrictions Precautions Precautions: Fall Restrictions Weight Bearing Restrictions: No      Mobility  Bed Mobility Overal bed mobility: Needs Assistance Bed Mobility: Supine to Sit     Supine to sit: Min assist;HOB elevated     General bed mobility comments: assisted R LE and trunk to come up and forward.  Transfers Overall transfer level: Needs assistance Equipment used: 2 person hand held assist Transfers: Sit to/from [facility] Sit to Stand: Min assist Stand pivot transfers: Min assist;+2 safety/equipment       General transfer comment: cues for hand placement and assist more forward than boost.  Ambulation/Gait                Stairs            Wheelchair Mobility    Modified Rankin (Stroke Patients Only)       Balance Overall balance assessment: Needs assistance Sitting-balance support: No upper extremity supported;Feet supported Sitting balance-Leahy Scale: Fair     Standing balance support: Single extremity supported;Bilateral upper extremity supported Standing balance-Leahy Scale: Poor(to  fair soon) Standing balance comment: very light assist with static standing                              Pertinent Vitals/Pain      Home Living Family/patient expects to be discharged to:: Private residence Living Arrangements: Children(son and his roommate) Available Help at Discharge: Family;Available PRN/intermittently(son works) Type of Home: House Home Access: Stairs to enter Entrance Stairs-Rails: None Technical brewer of Steps: 4 Home Layout: One level Home Equipment: Environmental consultant - 4 wheels;Cane - single point;Grab bars - tub/shower Additional Comments: oxygen 4 L O2 [place] and baseline    Prior Function Level of Independence: Independent with assistive device(s)         Comments: walks with rollator when she leaves her home, no device inside, independent in ADL and IADL     Hand Dominance   Dominant Hand: Right    Extremity/Trunk Assessment   Upper Extremity Assessment Upper Extremity Assessment: Generalized weakness    Lower Extremity Assessment Lower Extremity Assessment: Defer to PT evaluation       Communication   Communication: No difficulties  Cognition Arousal/Alertness: Awake/alert Behavior During Therapy: WFL for tasks assessed/performed Overall Cognitive Status: History of cognitive impairments - at baseline                                        General Comments  Exercises     Assessment/Plan    PT Assessment    PT Problem List         PT Treatment Interventions      PT Goals (Current goals can be found in the Care Plan section)  Acute Rehab PT Goals Patient Stated Goal: I really want to go to a nursing home to get independent and stronger, b/c (my son works and can't be off to help me.)    Frequency     Barriers to discharge        Co-evaluation               AM-PAC PT "6 Clicks" Mobility  Outcome Measure                  End of Session              Time:  -       Charges:              05/10/2019  Ginger Carne., PT Acute Rehabilitation Services [phone]  (pager) [phone]  (office)  Tessie Fass Laquashia Mergenthaler 05/10/2019, 3:30 PM

## 2019-05-10 NOTE — Progress Notes (Signed)
NAME:  [name], MRN:  [id number], DOB:  1960/05/10, LOS: 8 ADMISSION DATE:  05/02/2019, CONSULTATION DATE:  4/28 REFERRING MD:  Long, CHIEF COMPLAINT:  Dyspnea   Brief History   59 y/o female with [place] on pulmonary vasodilators admitted on 4/28 with severe sepsis in the setting of pneumonia and cellulitis.  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 COVID 4/28 > neg. Blood 4/28 >strep G RVP 4/28 >negative Blood 5/02>negative  Antimicrobials:  Vanc 4/28 >4/29 Ceftriaxone 4/28 >5/3 Azithromycin 4/28 > 5/1 Cefazolin 5/4 x1 Amoxicillin 5/5 >   Interim history/subjective:  Remains on heated high flow Burning pain in legs Only made it to a chair for "10-15 seconds"   Objective   Blood pressure 113/63, pulse 83, temperature 98.2 F (36.8 C), temperature source Axillary, resp. rate (!) 24, weight 126.9 kg, last menstrual period 02/19/2011, SpO2 96 %. CVP:  [10 mmHg-11 mmHg] 10 mmHg  FiO2 (%):  [60 %-80 %] 80 %   Intake/Output Summary (Last 24 hours) at 05/10/2019 0739 Last data filed at 05/10/2019 0700 Gross per 24 hour  Intake 1852.8 ml  Output 1600 ml  Net 252.8 ml   Filed Weights   05/08/19 0500 05/09/19 0345 05/10/19 0444  Weight: 126.7 kg 126.9 kg 126.9 kg    Examination:  General:  Obese, resting comfortably in bed HENT: NCAT OP  clear PULM: Crackles bases B, normal effort CV: RRR, no mgr GI: BS+, soft, nontender MSK: normal bulk and tone Neuro: awake, alert, no distress, [facility] Problem list   Septic shock Acute toxic metabolic encephalopathy > resolved  Assessment & Plan:  Acute on chronic hypoxemic and hypercarbic respiratory failure in setting of decompensated cor pulmonale.  Has severe PAH baseline.  Continue remodulin, switch to cartridge on Saturday Continue sildenafil Wean off heated high flow  CT chest to better understand hypoxemia RHC may be helpful to guide [place]  OSA HHF at night for now  Hypernatremia AKI > improving  Free water > limited to 2L Monitor BMET and UOP Replace electrolytes as needed Defer lasix to Cardiology today  Strep G bacteremia, CAP, cellulitis right leg Amoxicillin for 14 days  Paroxysmal Atrial fib Tele xarelto  Nutritional needs Diet as tolerated  DM2  SSI   Best practice:  Diet: advance Pain/Anxiety/Delirium protocol (if indicated): n/a VAP protocol (if indicated): n/a DVT prophylaxis: change to xarelto GI prophylaxis: n/a Glucose control: SSI Mobility: out of bed Code Status: limited code Family Communication: will update later today Disposition: remain in ICU  Labs   CBC: Recent Labs  Lab 05/04/19 1022 05/04/19 1452 05/05/19 0039 05/05/19 0514 05/05/19 1021 05/06/19 0804 05/09/19 0820  WBC 45.5*  --   --   --  45.3* 37.9* 13.0*  HGB 13.7   < > 15.6* 15.3* 12.7 12.0 12.3  HCT 43.4   < > 46.0 45.0 41.2 38.4  39.3  MCV 90.2  --   --   --  91.8 89.9 87.7  PLT 295  --   --   --  227 181 141*   < > = values in this interval not displayed.    Basic Metabolic Panel: Recent Labs  Lab 05/04/19 1022 05/04/19 1452 05/05/19 1021 05/05/19 1544 05/06/19 0804 05/06/19 1911 05/09/19 0820  NA 138   < > 140 140 143 148* 152*  K 3.3*   < > 3.5 3.9 3.3* 3.5 3.1*  CL 103  --  103 104 106 108 109  CO2 24  --  _0 GLUCOSE 148*  --  158* 133* 126* 104* 111*  BUN 30*  --  39* 41* 38* 36* 25*  CREATININE 1.26*  --  1.50* 1.54* 1.32* 1.09* 0.73  CALCIUM 7.5*  --  8.0* 7.8* 8.3* 8.4* 8.4*  MG 2.2  --   --   --   --   --   --    < > = values in this interval not displayed.   GFR: Estimated Creatinine Clearance: 101.5 mL/min (by C-G formula based on SCr of 0.73 mg/dL). Recent Labs  Lab 05/03/19 1506 05/04/19 1022 05/05/19 1021 05/05/19 1232 05/06/19 0804 05/09/19 0820  WBC  --  45.5* 45.3*  --  37.9* 13.0*  LATICACIDVEN 2.7*  --  1.2 0.9  --   --     Liver Function Tests: Recent Labs  Lab 05/09/19 0820  AST 23  ALT 13  ALKPHOS 57  BILITOT 0.7  PROT 5.9*  ALBUMIN 2.4*   No results for input(s): LIPASE, AMYLASE in the last 168 hours. No results for input(s): AMMONIA in the last 168 hours.  ABG    Component Value Date/Time   PHART 7.216 (L) 05/05/2019 0514   PCO2ART 71.3 (HH) 05/05/2019 0514   PO2ART 79 (L) 05/05/2019 0514   HCO3 29.0 (H) 05/05/2019 0514   TCO2 31 05/05/2019 0514   ACIDBASEDEF 1.0 05/05/2019 0514   O2SAT 76.9 05/05/2019 1021     Coagulation Profile: No results for input(s): INR, PROTIME in the last 168 hours.  Cardiac Enzymes: No results for input(s): CKTOTAL, CKMB, CKMBINDEX, TROPONINI in the last 168 hours.  HbA1C: Hemoglobin A1C  Date/Time Value Ref Range Status  04/09/2019 01:48 PM 5.5 4.0 - 5.6 % Final  07/11/2018 04:18 PM 6.1 (A) 4.0 - 5.6 % Final   Hgb A1c MFr Bld  Date/Time Value Ref Range Status  10/14/2010 10:41 AM 13.5 (H) <5.7 % Final    Comment:                                                                           According to the ADA Clinical Practice Recommendations for 2011, when HbA1c is used as a screening test:     >=6.5%   Diagnostic of Diabetes Mellitus            (if abnormal result is confirmed)   5.7-6.4%   Increased risk of developing Diabetes Mellitus   References:Diagnosis and Classification of Diabetes  [place],Diabetes [facility],8295,62(ZHYQM 1):S62-S69 and Standards of Medical Care in         Diabetes -  2011,Diabetes [facility],9470,96 (Suppl 1):S11-S61.    CBG: Recent Labs  Lab 05/09/19 1125 05/09/19 1520 05/09/19 1934 05/09/19 2325 05/10/19 0339  GLUCAP 88 182* 163* 144* 109*    Critical care time: 31 minutes     Roselie Awkward, MD [facility] Pager: [phone] Cell: [phone] If no response, call [phone]

## 2019-05-10 NOTE — Progress Notes (Addendum)
Advanced Heart Failure Rounding Note   Subjective:     On BiPAP overnight. Now on high flow [place] 30 L/min.   Feels a bit better today. Legs are improving.   Na elevated at 152 yesterday. Repeat labs pending.   Objective:   Weight Range:  Vital Signs:   Temp:  [98.2 F (36.8 C)-98.7 F (37.1 C)] 98.6 F (37 C) (05/06 0758) Pulse Rate:  [81-107] 95 (05/06 0800) Resp:  [16-30] 25 (05/06 0800) BP: (94-156)/(50-124) 156/124 (05/06 0800) SpO2:  [78 %-96 %] 93 % (05/06 0800) FiO2 (%):  [70 %-80 %] 70 % (05/06 0745) Weight:  [126.9 kg] 126.9 kg (05/06 0444) Last BM Date: 05/10/19  Weight change: Filed Weights   05/08/19 0500 05/09/19 0345 05/10/19 0444  Weight: 126.7 kg 126.9 kg 126.9 kg    Intake/Output:   Intake/Output Summary (Last 24 hours) at 05/10/2019 0950 Last data filed at 05/10/2019 0700 Gross per 24 hour  Intake 1830.04 ml  Output 1400 ml  Net 430.04 ml     Physical Exam: General:  Chronically ill appearing, super morbidly obese. On HFNC HEENT: normal  Neck: supple. Thick neck, +LIJ triple lumen CVC, + rt IJ tunneled hickman cath Carotids 2+ bilat; no bruits. No lymphadenopathy or thryomegaly appreciated. Cor: PMI nondisplaced. Regular rate & rhythm. 2/6 TR Lungs: coarse Abdomen: obese soft, nontender, nondistended. No hepatosplenomegaly. No bruits or masses. Good bowel sounds. Extremities: no cyanosis, clubbing, rash, 1-2+ edema + severe RLE erythema (improving) Neuro: alert & orientedx3, cranial nerves grossly intact. moves all 4 extremities w/o difficulty. Affect pleasant   Telemetry: NSR 80s Personally reviewed   Labs: Basic Metabolic Panel: Recent Labs  Lab 05/04/19 1022 05/04/19 1452 05/05/19 1021 05/05/19 1021 05/05/19 1544 05/05/19 1544 05/06/19 0804 05/06/19 1911 05/09/19 0820  NA 138   < > 140  --  140  --  143 148* 152*  K 3.3*   < > 3.5  --  3.9  --  3.3* 3.5 3.1*  CL 103  --  103  --  104  --  106 108 109  CO2 24  --  27  --  26   --  _0 GLUCOSE 148*  --  158*  --  133*  --  126* 104* 111*  BUN 30*  --  39*  --  41*  --  38* 36* 25*  CREATININE 1.26*  --  1.50*  --  1.54*  --  1.32* 1.09* 0.73  CALCIUM 7.5*  --  8.0*   < > 7.8*   < > 8.3* 8.4* 8.4*  MG 2.2  --   --   --   --   --   --   --   --    < > = values in this interval not displayed.    Liver Function Tests: Recent Labs  Lab 05/09/19 0820  AST 23  ALT 13  ALKPHOS 57  BILITOT 0.7  PROT 5.9*  ALBUMIN 2.4*   No results for input(s): LIPASE, AMYLASE in the last 168 hours. No results for input(s): AMMONIA in the last 168 hours.  CBC: Recent Labs  Lab 05/04/19 1022 05/04/19 1452 05/05/19 0039 05/05/19 0514 05/05/19 1021 05/06/19 0804 05/09/19 0820  WBC 45.5*  --   --   --  45.3* 37.9* 13.0*  HGB 13.7   < > 15.6* 15.3* 12.7 12.0 12.3  HCT 43.4   < > 46.0 45.0 41.2 38.4 39.3  MCV 90.2  --   --   --  91.8 89.9 87.7  PLT 295  --   --   --  227 181 141*   < > = values in this interval not displayed.    Cardiac Enzymes: No results for input(s): CKTOTAL, CKMB, CKMBINDEX, TROPONINI in the last 168 hours.  BNP: BNP (last 3 results) Recent Labs    06/15/18 0744 05/02/19 2350 05/07/19 1112  BNP 49.0 441.1* 325.9*    ProBNP (last 3 results) No results for input(s): PROBNP in the last 8760 hours.    Other results:  Imaging: IR Fluoro Guide CV Line Right  Result Date: 05/09/2019 CLINICAL DATA:  Pulmonary arterial hypertension, needs durable venous access EXAM: TUNNELED CENTRAL VENOUS CATHETER PLACEMENT WITH ULTRASOUND AND FLUOROSCOPIC GUIDANCE TECHNIQUE: The procedure, risks, benefits, and alternatives were explained to the patient. Questions regarding the procedure were encouraged and answered. The patient understands and consents to the procedure. Patency of the right IJ vein was confirmed with ultrasound with image documentation. An appropriate skin site was determined. Region was prepped using maximum barrier technique including  cap and mask, sterile gown, sterile gloves, large sterile sheet, and Chlorhexidine as cutaneous antisepsis. The region was infiltrated locally with 1% lidocaine. Under real-time ultrasound guidance, the right IJ vein was accessed with a 21 gauge micropuncture needle; the needle tip within the vein was confirmed with ultrasound image documentation. 31F dual-lumen cuffed powerPICC tunneled from a right anterior chest wall approach to the dermatotomy site. Needle exchanged over the 018 guidewire for transitional dilator, through which the catheter which had been cut to 20 cm was advanced under intermittent fluoroscopy, positioned with its tip in the mid SVC. Spot chest radiograph confirms good catheter position. No pneumothorax. Catheter was flushed per protocol. Catheter secured externally with O Prolene suture. The right IJ dermatotomy site was closed with Dermabond. COMPLICATIONS: COMPLICATIONS None immediate FLUOROSCOPY TIME:  0.4 minute; 44 uGym2 DAP COMPARISON:  None IMPRESSION: 1. Technically successful placement of tunneled right IJ tunneled dual-lumen power injectable catheter with ultrasound and fluoroscopic guidance. Ready for routine use. Electronically Signed   By: Lucrezia Europe M.D.   On: 05/09/2019 12:28   IR US Guide Vasc Access Right  Result Date: 05/09/2019 CLINICAL DATA:  Pulmonary arterial hypertension, needs durable venous access EXAM: TUNNELED CENTRAL VENOUS CATHETER PLACEMENT WITH ULTRASOUND AND FLUOROSCOPIC GUIDANCE TECHNIQUE: The procedure, risks, benefits, and alternatives were explained to the patient. Questions regarding the procedure were encouraged and answered. The patient understands and consents to the procedure. Patency of the right IJ vein was confirmed with ultrasound with image documentation. An appropriate skin site was determined. Region was prepped using maximum barrier technique including cap and mask, sterile gown, sterile gloves, large sterile sheet, and Chlorhexidine as  cutaneous antisepsis. The region was infiltrated locally with 1% lidocaine. Under real-time ultrasound guidance, the right IJ vein was accessed with a 21 gauge micropuncture needle; the needle tip within the vein was confirmed with ultrasound image documentation. 31F dual-lumen cuffed powerPICC tunneled from a right anterior chest wall approach to the dermatotomy site. Needle exchanged over the 018 guidewire for transitional dilator, through which the catheter which had been cut to 20 cm was advanced under intermittent fluoroscopy, positioned with its tip in the mid SVC. Spot chest radiograph confirms good catheter position. No pneumothorax. Catheter was flushed per protocol. Catheter secured externally with O Prolene suture. The right IJ dermatotomy site was closed with Dermabond. COMPLICATIONS: COMPLICATIONS None immediate FLUOROSCOPY TIME:  0.4  minute; 44 uGym2 DAP COMPARISON:  None IMPRESSION: 1. Technically successful placement of tunneled right IJ tunneled dual-lumen power injectable catheter with ultrasound and fluoroscopic guidance. Ready for routine use. Electronically Signed   By: Lucrezia Europe M.D.   On: 05/09/2019 12:28     Medications:     Scheduled Medications: . amoxicillin  500 mg Oral Q8H  . Chlorhexidine Gluconate Cloth  6 each Topical Daily  . enoxaparin (LOVENOX) injection  130 mg Subcutaneous Q12H  . ferrous sulfate  325 mg Oral Q breakfast  . gabapentin  600 mg Oral BID  . insulin aspart  0-20 Units Subcutaneous Q4H  . mouth rinse  15 mL Mouth Rinse BID  . pantoprazole (PROTONIX) IV  40 mg Intravenous QHS  . pravastatin  20 mg Oral Daily  . sildenafil  20 mg Oral TID  . vitamin B-12  500 mcg Oral Daily    Infusions: . sodium chloride 10 mL/hr at 05/10/19 0600  . treprostinil (REMODULIN) infusion 81 ng/kg/min (05/08/19 2049)    PRN Medications: docusate sodium, lidocaine (PF), ondansetron (ZOFRAN) IV, polyethylene glycol   Assessment/Plan:   1. Shock, septic - Likely  source lower extremity cellulitis. Also ? Possible PNA. She also had hickman catheter in R upper chest (has been removed) now w/ new LIJ CVC.  - Initial Blood Cultures + for Strep Group G.  - F/u cultures negative - new Rt IJ hickman cath placed 5/5 - on abx. ID narrowed abx from ceftriaxone to amoxicillin. AF/ WBC improving - Off pressors - ideally would get TEE to evaluate for endocarditis but likely respiratory status won't tolerate. ID planning 14 days abx.   2. LE cellulitis - Seen by ID feel that it is improving but skin changes look like thermal burn.  - elevate leg as tolerated.  - no change  3. A/C Hypoxic Respiratory Failure - Remains off Biapp during daytime. On HF [place]. Still ltenuous - diurese as tolerated. Will await repeat BMP before re-dosing lasix (hypernatremia)  - weight down 20 pounds but still about 20 pounds up from baseline (260 pounds) - Lasix on hold due to hypernatremia..  - repeat BMP give additional lasix 40 IV bid today is Na ok   4. NSTEMI  -Trend 845>3646> 2856. Suspect demand ischemia - 2015 had myoview In EF 78% and no ischemia  - Not having chest pain. EF stable on echo - can consider cath as course determines. Currently not cath candidate  5. Severe PAH with Cor Pulmonale: - On IV remodulin with [facility]. Need to continue - Sildenafil resumed 5/4 - New Hickman cath placed  6. PAF - in NSR. On lovenox. Can switch back to NOAC soon  7. Acute on chronic diastolic HF - diurese as tolerated - assessment of volume status difficult given super morbid obesity - Still up about 20 pounds. Check BMP today. If Na ok, Will resume IV lasix.   Length of Stay: [place] PA-C 05/10/2019, 9:50 AM  Advanced Heart Failure Team Pager [phone] (M-F; 7a - 4p)  Please contact [facility] for night-coverage after hours (4p -7a ) and weekends on [email or web address]  Patient seen and examined with the above-signed Advanced Practice Provider and/or Housestaff.  I personally reviewed laboratory data, imaging studies and relevant notes. I independently examined the patient and formulated the important aspects of the plan. I have edited the note to reflect any of my changes or salient points. I have personally discussed the plan with the patient  and/or family.  Remains very tenuous. On 30L HFNC. Feels SOB. Off pressors. Remains on remodulin. Hi-res chest CT shows dense consolidation/atx in bilateral lower lobes. +2v CAD (RCA, LAD). Lasix on hold due to hypernatremia.  General:  Obese woman. Ill appearing. On O2 via HFNC HEENT: normal + flushed Neck: supple. JVP hard to see. + TLC Carotids 2+ bilat; no bruits. No lymphadenopathy or thryomegaly appreciated. Cor: PMI nondisplaced. Regular rate & rhythm. 2/6 TR. Lungs: coarse poor airmovement Abdomen: obese soft, nontender, nondistended. No hepatosplenomegaly. No bruits or masses. Good bowel sounds. Extremities: no cyanosis, clubbing, rash, wrapped. RLE erythema improving Neuro: alert & orientedx3, cranial nerves grossly intact. moves all 4 extremities w/o difficulty. Affect pleasant  Remains quite tenuous but improving slowly. HiRes CT results noted. Significant LL atelectasis/consolidation. Also seems to have more volume on board. Will continue gentle diuresis. With elevated hstrop and CAD on CT she warrants cath but doubt risks/benefits are in her favor currently. Can reconsider as she improves.  Continue remodulin and sildenafil. D/w Dr. [place] Bells.   Glori Bickers, MD  10:54 PM

## 2019-05-10 NOTE — Progress Notes (Signed)
Pt up to chair for ~ couple hours with PT/OT.  Recc d/c to rehab @ [facility] came out on transfer back to bed.   CT chest obtained.  Lt CVC IJ removed.  Transitioned Remodulin to home CADD pump infusing through rt tunneled Hickman cath.  Next cartridge exchange will be ~ noon 5/8. Lovenox transitioned to Xarelto.  Pt tolerating [place] HFNC.  Per order O2> 92% when awake and 90% when sleeping.

## 2019-05-11 DIAGNOSIS — L03115 Cellulitis of right lower limb: Secondary | ICD-10-CM | POA: Diagnosis not present

## 2019-05-11 DIAGNOSIS — J9622 Acute and chronic respiratory failure with hypercapnia: Secondary | ICD-10-CM | POA: Diagnosis not present

## 2019-05-11 DIAGNOSIS — J9621 Acute and chronic respiratory failure with hypoxia: Secondary | ICD-10-CM | POA: Diagnosis not present

## 2019-05-11 DIAGNOSIS — I5033 Acute on chronic diastolic (congestive) heart failure: Secondary | ICD-10-CM | POA: Diagnosis not present

## 2019-05-11 LAB — COMPREHENSIVE METABOLIC PANEL
ALT: 13 U/L (ref 0–44)
AST: 26 U/L (ref 15–41)
Albumin: 2.4 g/dL — ABNORMAL LOW (ref 3.5–5.0)
Alkaline Phosphatase: 60 U/L (ref 38–126)
Anion gap: 8 (ref 5–15)
BUN: 10 mg/dL (ref 6–20)
CO2: 32 mmol/L (ref 22–32)
Calcium: 8.1 mg/dL — ABNORMAL LOW (ref 8.9–10.3)
Chloride: 108 mmol/L (ref 98–111)
Creatinine, Ser: 0.69 mg/dL (ref 0.44–1.00)
GFR calc Af Amer: >60 mL/min (ref >60)
GFR calc non Af Amer: >60 mL/min (ref >60)
Glucose, Bld: 105 mg/dL — ABNORMAL HIGH (ref 70–99)
Potassium: 3.6 mmol/L (ref 3.5–5.1)
Sodium: 148 mmol/L — ABNORMAL HIGH (ref 135–145)
Total Bilirubin: 1.2 mg/dL (ref 0.3–1.2)
Total Protein: 5.9 g/dL — ABNORMAL LOW (ref 6.5–8.1)

## 2019-05-11 LAB — CBC
HCT: 40.5 % (ref 36.0–46.0)
Hemoglobin: 12.6 g/dL (ref 12.0–15.0)
MCH: 27.1 pg (ref 26.0–34.0)
MCHC: 31.1 g/dL (ref 30.0–36.0)
MCV: 87.1 fL (ref 80.0–100.0)
Platelets: 138 10*3/uL — ABNORMAL LOW (ref 150–400)
RBC: 4.65 MIL/uL (ref 3.87–5.11)
RDW: 17.4 % — ABNORMAL HIGH (ref 11.5–15.5)
WBC: 9.5 10*3/uL (ref 4.0–10.5)
nRBC: 0.7 % — ABNORMAL HIGH (ref 0.0–0.2)

## 2019-05-11 LAB — CULTURE, BLOOD (ROUTINE X 2)
Culture: NO GROWTH
Culture: NO GROWTH
Special Requests: ADEQUATE

## 2019-05-11 LAB — GLUCOSE, CAPILLARY
Glucose-Capillary: 91 mg/dL (ref 70–99)
Glucose-Capillary: 85 mg/dL (ref 70–99)
Glucose-Capillary: 129 mg/dL — ABNORMAL HIGH (ref 70–99)
Glucose-Capillary: 124 mg/dL — ABNORMAL HIGH (ref 70–99)
Glucose-Capillary: 135 mg/dL — ABNORMAL HIGH (ref 70–99)
Glucose-Capillary: 126 mg/dL — ABNORMAL HIGH (ref 70–99)

## 2019-05-11 MED ORDER — INSULIN ASPART 100 UNIT/ML ~~LOC~~ SOLN
0.0000 [IU] | Freq: Three times a day (TID) | SUBCUTANEOUS | Status: DC
  Administered 2019-05-11 (×2): 3 [IU] via SUBCUTANEOUS
  Administered 2019-05-12 – 2019-05-13 (×3): 4 [IU] via SUBCUTANEOUS
  Administered 2019-05-13 – 2019-05-15 (×3): 3 [IU] via SUBCUTANEOUS
  Administered 2019-05-15 – 2019-05-19 (×3): 4 [IU] via SUBCUTANEOUS
  Administered 2019-05-20: 3 [IU] via SUBCUTANEOUS
  Administered 2019-05-20 (×2): 4 [IU] via SUBCUTANEOUS
  Administered 2019-05-21: 3 [IU] via SUBCUTANEOUS
  Administered 2019-05-21: 7 [IU] via SUBCUTANEOUS
  Administered 2019-05-21: 4 [IU] via SUBCUTANEOUS
  Administered 2019-05-22 – 2019-05-23 (×4): 3 [IU] via SUBCUTANEOUS
  Administered 2019-05-23: 4 [IU] via SUBCUTANEOUS
  Administered 2019-05-23 – 2019-05-24 (×3): 3 [IU] via SUBCUTANEOUS

## 2019-05-11 MED ORDER — SPIRONOLACTONE 12.5 MG HALF TABLET
12.5000 mg | ORAL_TABLET | Freq: Every day | ORAL | Status: DC
  Administered 2019-05-11 – 2019-05-24 (×14): 12.5 mg via ORAL
  Filled 2019-05-11 (×14): qty 1

## 2019-05-11 MED ORDER — POTASSIUM CHLORIDE CRYS ER 20 MEQ PO TBCR
40.0000 meq | EXTENDED_RELEASE_TABLET | Freq: Once | ORAL | Status: AC
  Administered 2019-05-11: 40 meq via ORAL
  Filled 2019-05-11: qty 2

## 2019-05-11 MED ORDER — FUROSEMIDE 10 MG/ML IJ SOLN
80.0000 mg | Freq: Two times a day (BID) | INTRAMUSCULAR | Status: AC
  Administered 2019-05-11: 80 mg via INTRAVENOUS
  Filled 2019-05-11: qty 8

## 2019-05-11 NOTE — Progress Notes (Signed)
Advanced Heart Failure Rounding Note   Subjective:     CT chest 5/6 with severe LL consolidation/atx + edema  Improved on BIPAP.   Received IV lasix yesterday. Weight unchanged.   Says she feels a bit better today. No CP. SOB improved. RLE erythema much improved.   Objective:   Weight Range:  Vital Signs:   Temp:  [97.7 F (36.5 C)-98.8 F (37.1 C)] 98.5 F (36.9 C) (05/07 0734) Pulse Rate:  [62-97] 94 (05/07 0900) Resp:  [10-34] 34 (05/07 0900) BP: (111-154)/(57-101) 133/74 (05/07 0900) SpO2:  [74 %-97 %] 91 % (05/07 0900) FiO2 (%):  [30 %] 30 % (05/06 1200) Weight:  [127.3 kg] 127.3 kg (05/07 0500) Last BM Date: 05/11/19  Weight change: Filed Weights   05/09/19 0345 05/10/19 0444 05/11/19 0500  Weight: 126.9 kg 126.9 kg 127.3 kg    Intake/Output:   Intake/Output Summary (Last 24 hours) at 05/11/2019 1031 Last data filed at 05/11/2019 1000 Gross per 24 hour  Intake 1388.97 ml  Output 1810 ml  Net -421.03 ml     Physical Exam: General:  Morbidly obese woman sitting up in bed On bipap HEENT: normal Neck: supple. JVP hard to see Carotids 2+ bilat; no bruits. No lymphadenopathy or thryomegaly appreciated. Cor: PMI nondisplaced. Regular rate & rhythm. 2/6 TR + hickman Lungs: coarse. Dull in basese Abdomen: obese soft, nontender, nondistended. No hepatosplenomegaly. No bruits or masses. Good bowel sounds. Extremities: no cyanosis, clubbing, rash, 1+ edema  RLE erythema much improved Neuro: alert & orientedx3, cranial nerves grossly intact. moves all 4 extremities w/o difficulty. Affect pleasant   Telemetry: NSR 80-90s Personally reviewed   Labs: Basic Metabolic Panel: Recent Labs  Lab 05/06/19 0804 05/06/19 0804 05/06/19 1911 05/06/19 1911 05/09/19 0820 05/10/19 1535 05/11/19 0522  NA 143  --  148*  --  152* 149* 148*  K 3.3*  --  3.5  --  3.1* 3.2* 3.6  CL 106  --  108  --  109 107 108  CO2 30  --  29  --  31 32 32  GLUCOSE 126*  --  104*  --   111* 153* 105*  BUN 38*  --  36*  --  25* 15 10  CREATININE 1.32*  --  1.09*  --  0.73 0.67 0.69  CALCIUM 8.3*   < > 8.4*   < > 8.4* 8.2* 8.1*  MG  --   --   --   --   --  2.0  --    < > = values in this interval not displayed.    Liver Function Tests: Recent Labs  Lab 05/09/19 0820 05/11/19 0522  AST 23 26  ALT 13 13  ALKPHOS 57 60  BILITOT 0.7 1.2  PROT 5.9* 5.9*  ALBUMIN 2.4* 2.4*   No results for input(s): LIPASE, AMYLASE in the last 168 hours. No results for input(s): AMMONIA in the last 168 hours.  CBC: Recent Labs  Lab 05/05/19 0514 05/05/19 1021 05/06/19 0804 05/09/19 0820 05/11/19 0522  WBC  --  45.3* 37.9* 13.0* 9.5  HGB 15.3* 12.7 12.0 12.3 12.6  HCT 45.0 41.2 38.4 39.3 40.5  MCV  --  91.8 89.9 87.7 87.1  PLT  --  227 181 141* 138*    Cardiac Enzymes: No results for input(s): CKTOTAL, CKMB, CKMBINDEX, TROPONINI in the last 168 hours.  BNP: BNP (last 3 results) Recent Labs    06/15/18 0744 05/02/19 2350 05/07/19 1112  BNP 49.0 441.1* 325.9*    ProBNP (last 3 results) No results for input(s): PROBNP in the last 8760 hours.    Other results:  Imaging: CT Chest High Resolution  Result Date: 05/10/2019 CLINICAL DATA:  Inpatient. Hypoxemia. EXAM: CT CHEST WITHOUT CONTRAST TECHNIQUE: Multidetector CT imaging of the chest was performed following the standard protocol without intravenous contrast. High resolution imaging of the lungs, as well as inspiratory and expiratory imaging, was performed. COMPARISON:  05/04/2019 chest radiograph. 06/24/2008 chest CT. FINDINGS: Markedly motion degraded scan, significantly limiting assessment. Cardiovascular: Moderate cardiomegaly. Small to moderate pericardial effusion is stable. Left anterior descending and right coronary atherosclerosis. Right internal jugular central venous catheter terminates in the middle third of the SVC. Left internal jugular central venous catheter terminates in the middle third of the SVC.  Atherosclerotic nonaneurysmal thoracic aorta. Stable dilated main pulmonary artery (5.0 cm diameter). Mediastinum/Nodes: No discrete thyroid nodules. Unremarkable esophagus. No axillary adenopathy. Stable enlarged 1.8 cm lateral right prevascular node (series 5/image 48). No new pathologically enlarged mediastinal nodes. No discrete hilar adenopathy on these noncontrast images. Lungs/Pleura: No pneumothorax. Small right and trace left dependent pleural effusions. Dense consolidation with air bronchograms and volume loss in the dependent lower lobes bilaterally. Mild patchy ground-glass opacity in the peribronchovascular lungs bilaterally. No significant regions of bronchiectasis or frank honeycombing. No discrete lung masses or significant pulmonary nodules on these limited images. No evidence of significant air trapping or tracheobronchomalacia. Upper abdomen: Finely irregular liver surface, compatible with cirrhosis. Trace perihepatic ascites. Musculoskeletal: No aggressive appearing focal osseous lesions. Moderate thoracic spondylosis. IMPRESSION: 1. Markedly motion degraded scan, significantly limiting assessment. No findings to suggest underlying interstitial lung disease. 2. Dense consolidation with air bronchograms and volume loss in the dependent lower lobes bilaterally. Favor atelectasis, with a component of aspiration or pneumonia not excluded. 3. Small right and trace left dependent pleural effusions. 4. Moderate cardiomegaly. Chronic small to moderate pericardial effusion. Mild patchy ground-glass opacities in a peribronchovascular lungs, which may indicate mild pulmonary edema. 5. Stable dilated main pulmonary artery, suggesting chronic pulmonary arterial hypertension. 6. Cirrhosis. Trace perihepatic ascites. 7. Stable mild mediastinal lymphadenopathy, nonspecific, probably reactive. 8. Two-vessel coronary atherosclerosis. 9. Aortic Atherosclerosis (ICD10-I70.0). Electronically Signed   By: Ilona Sorrel  M.D.   On: 05/10/2019 15:38   IR Fluoro Guide CV Line Right  Result Date: 05/09/2019 CLINICAL DATA:  Pulmonary arterial hypertension, needs durable venous access EXAM: TUNNELED CENTRAL VENOUS CATHETER PLACEMENT WITH ULTRASOUND AND FLUOROSCOPIC GUIDANCE TECHNIQUE: The procedure, risks, benefits, and alternatives were explained to the patient. Questions regarding the procedure were encouraged and answered. The patient understands and consents to the procedure. Patency of the right IJ vein was confirmed with ultrasound with image documentation. An appropriate skin site was determined. Region was prepped using maximum barrier technique including cap and mask, sterile gown, sterile gloves, large sterile sheet, and Chlorhexidine as cutaneous antisepsis. The region was infiltrated locally with 1% lidocaine. Under real-time ultrasound guidance, the right IJ vein was accessed with a 21 gauge micropuncture needle; the needle tip within the vein was confirmed with ultrasound image documentation. 27F dual-lumen cuffed powerPICC tunneled from a right anterior chest wall approach to the dermatotomy site. Needle exchanged over the 018 guidewire for transitional dilator, through which the catheter which had been cut to 20 cm was advanced under intermittent fluoroscopy, positioned with its tip in the mid SVC. Spot chest radiograph confirms good catheter position. No pneumothorax. Catheter was flushed per protocol. Catheter secured externally with O Prolene  suture. The right IJ dermatotomy site was closed with Dermabond. COMPLICATIONS: COMPLICATIONS None immediate FLUOROSCOPY TIME:  0.4 minute; 44 uGym2 DAP COMPARISON:  None IMPRESSION: 1. Technically successful placement of tunneled right IJ tunneled dual-lumen power injectable catheter with ultrasound and fluoroscopic guidance. Ready for routine use. Electronically Signed   By: Lucrezia Europe M.D.   On: 05/09/2019 12:28   IR US Guide Vasc Access Right  Result Date:  05/09/2019 CLINICAL DATA:  Pulmonary arterial hypertension, needs durable venous access EXAM: TUNNELED CENTRAL VENOUS CATHETER PLACEMENT WITH ULTRASOUND AND FLUOROSCOPIC GUIDANCE TECHNIQUE: The procedure, risks, benefits, and alternatives were explained to the patient. Questions regarding the procedure were encouraged and answered. The patient understands and consents to the procedure. Patency of the right IJ vein was confirmed with ultrasound with image documentation. An appropriate skin site was determined. Region was prepped using maximum barrier technique including cap and mask, sterile gown, sterile gloves, large sterile sheet, and Chlorhexidine as cutaneous antisepsis. The region was infiltrated locally with 1% lidocaine. Under real-time ultrasound guidance, the right IJ vein was accessed with a 21 gauge micropuncture needle; the needle tip within the vein was confirmed with ultrasound image documentation. 108F dual-lumen cuffed powerPICC tunneled from a right anterior chest wall approach to the dermatotomy site. Needle exchanged over the 018 guidewire for transitional dilator, through which the catheter which had been cut to 20 cm was advanced under intermittent fluoroscopy, positioned with its tip in the mid SVC. Spot chest radiograph confirms good catheter position. No pneumothorax. Catheter was flushed per protocol. Catheter secured externally with O Prolene suture. The right IJ dermatotomy site was closed with Dermabond. COMPLICATIONS: COMPLICATIONS None immediate FLUOROSCOPY TIME:  0.4 minute; 44 uGym2 DAP COMPARISON:  None IMPRESSION: 1. Technically successful placement of tunneled right IJ tunneled dual-lumen power injectable catheter with ultrasound and fluoroscopic guidance. Ready for routine use. Electronically Signed   By: Lucrezia Europe M.D.   On: 05/09/2019 12:28     Medications:     Scheduled Medications: . amoxicillin  500 mg Oral Q8H  . Chlorhexidine Gluconate Cloth  6 each Topical Daily  .  ferrous sulfate  325 mg Oral Q breakfast  . gabapentin  600 mg Oral BID  . insulin aspart  0-20 Units Subcutaneous Q4H  . mouth rinse  15 mL Mouth Rinse BID  . pantoprazole (PROTONIX) IV  40 mg Intravenous QHS  . pravastatin  20 mg Oral Daily  . rivaroxaban  20 mg Oral Q supper  . sildenafil  20 mg Oral TID  . vitamin B-12  500 mcg Oral Daily    Infusions: . sodium chloride Stopped (05/10/19 1103)  . treprostinil (REMODULIN) infusion 81 ng/kg/min (05/10/19 1243)    PRN Medications: docusate sodium, lidocaine (PF), ondansetron (ZOFRAN) IV, polyethylene glycol   Assessment/Plan:   1. Shock, septic - Likely source lower extremity cellulitis. Also ? Possible PNA. She also had hickman catheter in R upper chest (has been removed) now w/ new LIJ CVC.  - Initial Blood Cultures + for Strep Group G.  - F/u cultures negative - new Rt IJ hickman cath placed 5/5 - on abx. ID narrowed abx from ceftriaxone to amoxicillin. AF. WBC now normal - Off pressors - ID planning 14 days abx. They do not feel TEE necessary - respiratory status unlikely to tolerate any wa  2. LE cellulitis - Much improved. Continue abx  3. A/C Hypoxic Respiratory Failure - Multifactorial. Improving - Hires CT 5/6 as above. + consolidation/atx and edema. Needs  BIPAP  - weight down 20 pounds but still about 20 pounds up from baseline (260 pounds) - minimal response to lasix 40IV x 1 yesterday - give lasix 80 IV bid today - D/w Dr. [place] Bells  4. NSTEMI  -Trend 150>4136> 2856. Suspect demand ischemia - 2015 had myoview In EF 78% and no ischemia  - Not having chest pain. EF stable on echo - Ct chest with 2v CAD in LAD and RCA - will plan coronary angio next week. Will have to hold Xarelto after Saturday night dose  5. Severe PAH with Cor Pulmonale: - On IV remodulin with [facility]. Need to continue - Sildenafil resumed 5/4 - New Hickman cath placed  6. PAF - in NSR. Back on NOAC  7. Acute on chronic diastolic  HF - Plan as above. Diurese  Length of Stay: 9   Glori Bickers MD 05/11/2019, 10:31 AM  Advanced Heart Failure Team Pager [phone] (M-F; 7a - 4p)  Please contact [facility] for night-coverage after hours (4p -7a ) and weekends on [email or web address]

## 2019-05-11 NOTE — Progress Notes (Signed)
Left message for 2W RN to call when ready for pt to be transferred.

## 2019-05-11 NOTE — Progress Notes (Addendum)
NAME:  [name], MRN:  [id number], DOB:  1960-11-11, LOS: 9 ADMISSION DATE:  05/02/2019, CONSULTATION DATE:  4/28 REFERRING MD:  Long, CHIEF COMPLAINT:  Dyspnea   Brief History   59 y/o female with [place] on pulmonary vasodilators admitted on 4/28 with severe sepsis in the setting of pneumonia and cellulitis.  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 Peripheral neuropathy Urinary incontinence  Significant Hospital Events   4/28 > admit started on HFNC and transitioned to NIPPV 2/2 to desaturation   Consults:  Heart failure  Procedures:  4/28 Art line pending> 4/29: CVC >> pulled out 4/30 5/1: infected hickman removed by IR 5/5 Hickman placed by IR  Significant Diagnostic Tests:  4/29 TTE> LVEF 41-66%, RV systolic function moderately reduced, RV size moderately enlarged, valves OK; in general RV function down compared to prior 5/7 CT chest > small bilateral effusions, large heart, large pulmonary arteries, some interlobular septal thickening, mild ground glass and atelectasis in dependent areas, 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 COVID 4/28 > neg. Blood 4/28 >strep G RVP 4/28 >negative Blood 5/02>negative  Antimicrobials:  Vanc 4/28 >4/29 Ceftriaxone 4/28 >5/3 Azithromycin 4/28 > 5/1 Cefazolin 5/4 x1 Amoxicillin 5/5 >   Interim history/subjective:   Was up to chair yesterday Titrated off heated high flow O2 yesterday   Objective   Blood pressure 123/70, pulse 85, temperature 98.5 F (36.9 C), temperature source Oral, resp. rate (!) 22, weight 127.3 kg, last menstrual period 02/19/2011, SpO2 97 %.    FiO2 (%):  [30 %] 30 %   Intake/Output Summary (Last 24 hours) at 05/11/2019 0751 Last data filed at 05/11/2019 0400 Gross per 24 hour  Intake 1268.97 ml  Output 1860 ml  Net  -591.03 ml   Filed Weights   05/09/19 0345 05/10/19 0444 05/11/19 0500  Weight: 126.9 kg 126.9 kg 127.3 kg    Examination:  General:  Morbidly obese, resting comfortably in bed HENT: NCAT OP clear PULM: CTA B, normal effort CV: RRR, no mgr GI: BS+, soft, nontender MSK: normal bulk and tone Neuro: awake, alert, no distress, MAEW   Resolved Hospital Problem list   Septic shock Acute toxic metabolic encephalopathy > resolved  Assessment & Plan:  Acute on chronic hypoxemic and hypercarbic respiratory failure in setting of decompensated cor pulmonale.  Has severe PAH baseline.  Continue Remodulin Continue sildenafil Wean off high flow oxygen for O2 saturation greater than 85% at rest Continue diuresis.  OSA Bronchomalacia Continue BiPAP at night, high flow oxygen during the daytime  Hypernatremia AKI > improving  Monitor BMET and UOP Replace electrolytes as needed Lasix per cardiology  Strep G bacteremia, CAP, cellulitis right leg Amoxicillin 14 days  Paroxysmal Atrial fib Tele Xarelto  Nutritional needs Continue diet as tolerated  DM2  SSI   Best practice:  Diet: advance Pain/Anxiety/Delirium protocol (if indicated): n/a VAP protocol (if indicated): n/a DVT prophylaxis: change to xarelto GI prophylaxis: n/a Glucose control: SSI Mobility: out of bed Code Status: limited code Family Communication: called her son Legrand Como for an update Disposition: move to progressive care, 2W only, [place] service to pick up on 5/7, PCCM will see as consult again on Monday or as needed over weekend  Labs   CBC: Recent Labs  Lab 05/04/19 1022 05/04/19 1452 05/05/19 0514 05/05/19 1021 05/06/19 0804 05/09/19 0820 05/11/19 0522  WBC 45.5*  --   --  45.3* 37.9* 13.0* 9.5  HGB 13.7   < > 15.3* 12.7 12.0 12.3 12.6  HCT 43.4   < > 45.0 41.2 38.4 39.3 40.5  MCV 90.2  --   --  91.8 89.9 87.7 87.1  PLT 295  --   --  227 181 141* 138*   < > = values in this interval not  displayed.    Basic Metabolic Panel: Recent Labs  Lab 05/04/19 1022 05/04/19 1452 05/06/19 0804 05/06/19 1911 05/09/19 0820 05/10/19 1535 05/11/19 0522  NA 138   < > 143 148* 152* 149* 148*  K 3.3*   < > 3.3* 3.5 3.1* 3.2* 3.6  CL 103   < > 106 108 109 107 108  CO2 24   < > _0 32 32  GLUCOSE 148*   < > 126* 104* 111* 153* 105*  BUN 30*   < > 38* 36* 25* 15 10  CREATININE 1.26*   < > 1.32* 1.09* 0.73 0.67 0.69  CALCIUM 7.5*   < > 8.3* 8.4* 8.4* 8.2* 8.1*  MG 2.2  --   --   --   --  2.0  --    < > = values in this interval not displayed.   GFR: Estimated Creatinine Clearance: 101.8 mL/min (by C-G formula based on SCr of 0.69 mg/dL). Recent Labs  Lab 05/05/19 1021 05/05/19 1232 05/06/19 0804 05/09/19 0820 05/11/19 0522  WBC 45.3*  --  37.9* 13.0* 9.5  LATICACIDVEN 1.2 0.9  --   --   --     Liver Function Tests: Recent Labs  Lab 05/09/19 0820 05/11/19 0522  AST 23 26  ALT 13 13  ALKPHOS 57 60  BILITOT 0.7 1.2  PROT 5.9* 5.9*  ALBUMIN 2.4* 2.4*   No results for input(s): LIPASE, AMYLASE in the last 168 hours. No results for input(s): AMMONIA in the last 168 hours.  ABG    Component Value Date/Time   PHART 7.216 (L) 05/05/2019 0514   PCO2ART 71.3 (HH) 05/05/2019 0514   PO2ART 79 (L) 05/05/2019 0514   HCO3 29.0 (H) 05/05/2019 0514   TCO2 31 05/05/2019 0514   ACIDBASEDEF 1.0 05/05/2019 0514   O2SAT 76.9 05/05/2019 1021     Coagulation Profile: No results for input(s): INR, PROTIME in the last 168 hours.  Cardiac Enzymes: No results for input(s): CKTOTAL, CKMB, CKMBINDEX, TROPONINI in the last 168 hours.  HbA1C: Hemoglobin A1C  Date/Time Value Ref Range Status  04/09/2019 01:48 PM 5.5 4.0 - 5.6 % Final  07/11/2018 04:18 PM 6.1 (A) 4.0 - 5.6 % Final   Hgb A1c MFr Bld  Date/Time Value Ref Range Status  10/14/2010 10:41 AM 13.5 (H) <5.7 % Final    Comment:                                                                           According to  the ADA Clinical Practice Recommendations for 2011, when HbA1c is used as a screening test:     >=6.5%   Diagnostic of Diabetes Mellitus            (if abnormal result is confirmed)  5.7-6.4%   Increased risk of developing Diabetes Mellitus   References:Diagnosis and Classification of Diabetes Mellitus,Diabetes LTRV,2023,34(DHWYS 1):S62-S69 and Standards of Medical Care in         Diabetes - 2011,Diabetes HUOH,7290,21 (Suppl 1):S11-S61.    CBG: Recent Labs  Lab 05/10/19 1608 05/10/19 1935 05/10/19 2357 05/11/19 0356 05/11/19 0736  GLUCAP 116* 174* 89 91 85    Critical care time: N/A; a total of 35 non-critical care minutes caring for her     Roselie Awkward, MD [place] Pager: [phone] Cell: [phone] If no response, call [phone]

## 2019-05-12 LAB — CBC
HCT: 39 % (ref 36.0–46.0)
Hemoglobin: 12.3 g/dL (ref 12.0–15.0)
MCH: 27.5 pg (ref 26.0–34.0)
MCHC: 31.5 g/dL (ref 30.0–36.0)
MCV: 87.2 fL (ref 80.0–100.0)
Platelets: 166 10*3/uL (ref 150–400)
RBC: 4.47 MIL/uL (ref 3.87–5.11)
RDW: 17.6 % — ABNORMAL HIGH (ref 11.5–15.5)
WBC: 10.7 10*3/uL — ABNORMAL HIGH (ref 4.0–10.5)
nRBC: 0.5 % — ABNORMAL HIGH (ref 0.0–0.2)

## 2019-05-12 LAB — COMPREHENSIVE METABOLIC PANEL
ALT: 14 U/L (ref 0–44)
AST: 25 U/L (ref 15–41)
Albumin: 2.5 g/dL — ABNORMAL LOW (ref 3.5–5.0)
Alkaline Phosphatase: 54 U/L (ref 38–126)
Anion gap: 11 (ref 5–15)
BUN: 8 mg/dL (ref 6–20)
CO2: 32 mmol/L (ref 22–32)
Calcium: 8 mg/dL — ABNORMAL LOW (ref 8.9–10.3)
Chloride: 101 mmol/L (ref 98–111)
Creatinine, Ser: 0.72 mg/dL (ref 0.44–1.00)
GFR calc Af Amer: >60 mL/min (ref >60)
GFR calc non Af Amer: >60 mL/min (ref >60)
Glucose, Bld: 130 mg/dL — ABNORMAL HIGH (ref 70–99)
Potassium: 3.3 mmol/L — ABNORMAL LOW (ref 3.5–5.1)
Sodium: 144 mmol/L (ref 135–145)
Total Bilirubin: 1 mg/dL (ref 0.3–1.2)
Total Protein: 6.1 g/dL — ABNORMAL LOW (ref 6.5–8.1)

## 2019-05-12 LAB — GLUCOSE, CAPILLARY
Glucose-Capillary: 134 mg/dL — ABNORMAL HIGH (ref 70–99)
Glucose-Capillary: 147 mg/dL — ABNORMAL HIGH (ref 70–99)
Glucose-Capillary: 162 mg/dL — ABNORMAL HIGH (ref 70–99)
Glucose-Capillary: 165 mg/dL — ABNORMAL HIGH (ref 70–99)
Glucose-Capillary: 98 mg/dL (ref 70–99)

## 2019-05-12 LAB — MAGNESIUM: Magnesium: 1.8 mg/dL (ref 1.7–2.4)

## 2019-05-12 MED ORDER — POTASSIUM CHLORIDE CRYS ER 20 MEQ PO TBCR
40.0000 meq | EXTENDED_RELEASE_TABLET | Freq: Once | ORAL | Status: AC
  Administered 2019-05-12: 40 meq via ORAL
  Filled 2019-05-12: qty 2

## 2019-05-12 MED ORDER — POTASSIUM CHLORIDE CRYS ER 10 MEQ PO TBCR
40.0000 meq | EXTENDED_RELEASE_TABLET | Freq: Two times a day (BID) | ORAL | Status: DC
  Administered 2019-05-12 – 2019-05-22 (×21): 40 meq via ORAL
  Filled 2019-05-12 (×24): qty 4

## 2019-05-12 MED ORDER — FUROSEMIDE 10 MG/ML IJ SOLN
160.0000 mg | Freq: Two times a day (BID) | INTRAVENOUS | Status: DC
  Administered 2019-05-12 – 2019-05-14 (×6): 160 mg via INTRAVENOUS
  Filled 2019-05-12 (×9): qty 16

## 2019-05-12 MED ORDER — ACETAZOLAMIDE 250 MG PO TABS
250.0000 mg | ORAL_TABLET | Freq: Two times a day (BID) | ORAL | Status: DC
  Administered 2019-05-12 – 2019-05-18 (×13): 250 mg via ORAL
  Filled 2019-05-12 (×14): qty 1

## 2019-05-12 MED ORDER — MAGNESIUM SULFATE IN D5W 1-5 GM/100ML-% IV SOLN
1.0000 g | Freq: Once | INTRAVENOUS | Status: AC
  Administered 2019-05-12: 1 g via INTRAVENOUS
  Filled 2019-05-12: qty 100

## 2019-05-12 NOTE — Progress Notes (Signed)
Show:Clear all _0 Manual_1 Template_2 Copied  Added by: _3 Krithi Bray, RN  _4 Hover for details Received Pt from 44M via bed by RN. Pt A&OX3 breathing equal and unlabored on 15L high flow oxygen. Pt denied pain. MAE well VS were taken and was placed on telemetry. POC reviewed with pt. Pt was oriented to room

## 2019-05-12 NOTE — Progress Notes (Signed)
Advanced Heart Failure Rounding Note   Subjective:     CT chest 5/6 with severe LL consolidation/atx + edema  Remains on HFNC. Getting IV lasix 80 IV bid. Weight recorded up 6 pounds. Creatinine stable. Had 15 beat run NSVT over night   Sats mid 80s-90s. Breathing better. Upset that she was woken up to take potassium    Objective:   Weight Range:  Vital Signs:   Temp:  [98 F (36.7 C)-98.4 F (36.9 C)] 98 F (36.7 C) (05/08 0806) Pulse Rate:  [82-118] 93 (05/08 0806) Resp:  [17-34] 17 (05/08 0806) BP: (93-145)/(54-95) 112/88 (05/08 0806) SpO2:  [68 %-96 %] 88 % (05/08 0806) FiO2 (%):  [60 %] 60 % (05/07 2345) Weight:  [130 kg] 130 kg (05/08 0500) Last BM Date: 05/11/19  Weight change: Filed Weights   05/10/19 0444 05/11/19 0500 05/12/19 0500  Weight: 126.9 kg 127.3 kg 130 kg    Intake/Output:   Intake/Output Summary (Last 24 hours) at 05/12/2019 0810 Last data filed at 05/12/2019 7858 Gross per 24 hour  Intake 840 ml  Output 1400 ml  Net -560 ml     Physical Exam: General:  Morbidly obese woman sitting up in bed On HFNC HEENT: normal  Neck: supple. JVP hard to see. Carotids 2+ bilat; no bruits. No lymphadenopathy or thryomegaly appreciated. Cor: PMI nondisplaced. Regular rate & rhythm. 2/6 TR Lungs: coarse. Decreased at bases Abdomen: obese soft, nontender, nondistended. No hepatosplenomegaly. No bruits or masses. Good bowel sounds. Extremities: no cyanosis, clubbing, rash, wrapped 1-2+ edema. + skin sloughing Neuro: alert & orientedx3, cranial nerves grossly intact. moves all 4 extremities w/o difficulty. Affect pleasant   Telemetry: NSR 80-90s + 15 beat run NSVT Personally reviewed   Labs: Basic Metabolic Panel: Recent Labs  Lab 05/06/19 1911 05/06/19 1911 05/09/19 0820 05/09/19 0820 05/10/19 1535 05/11/19 0522 05/12/19 0154  NA 148*  --  152*  --  149* 148* 144  K 3.5  --  3.1*  --  3.2* 3.6 3.3*  CL 108  --  109  --  107 108 101  CO2 29   --  31  --  32 32 32  GLUCOSE 104*  --  111*  --  153* 105* 130*  BUN 36*  --  25*  --  _0 CREATININE 1.09*  --  0.73  --  0.67 0.69 0.72  CALCIUM 8.4*   < > 8.4*   < > 8.2* 8.1* 8.0*  MG  --   --   --   --  2.0  --  1.8   < > = values in this interval not displayed.    Liver Function Tests: Recent Labs  Lab 05/09/19 0820 05/11/19 0522 05/12/19 0154  AST _1 ALT _2 ALKPHOS 57 60 54  BILITOT 0.7 1.2 1.0  PROT 5.9* 5.9* 6.1*  ALBUMIN 2.4* 2.4* 2.5*   No results for input(s): LIPASE, AMYLASE in the last 168 hours. No results for input(s): AMMONIA in the last 168 hours.  CBC: Recent Labs  Lab 05/05/19 1021 05/06/19 0804 05/09/19 0820 05/11/19 0522 05/12/19 0607  WBC 45.3* 37.9* 13.0* 9.5 10.7*  HGB 12.7 12.0 12.3 12.6 12.3  HCT 41.2 38.4 39.3 40.5 39.0  MCV 91.8 89.9 87.7 87.1 87.2  PLT 227 181 141* 138* 166    Cardiac Enzymes: No results for input(s): CKTOTAL, CKMB, CKMBINDEX, TROPONINI in the last 168 hours.  BNP:  BNP (last 3 results) Recent Labs    06/15/18 0744 05/02/19 2350 05/07/19 1112  BNP 49.0 441.1* 325.9*    ProBNP (last 3 results) No results for input(s): PROBNP in the last 8760 hours.    Other results:  Imaging: CT Chest High Resolution  Result Date: 05/10/2019 CLINICAL DATA:  Inpatient. Hypoxemia. EXAM: CT CHEST WITHOUT CONTRAST TECHNIQUE: Multidetector CT imaging of the chest was performed following the standard protocol without intravenous contrast. High resolution imaging of the lungs, as well as inspiratory and expiratory imaging, was performed. COMPARISON:  05/04/2019 chest radiograph. 06/24/2008 chest CT. FINDINGS: Markedly motion degraded scan, significantly limiting assessment. Cardiovascular: Moderate cardiomegaly. Small to moderate pericardial effusion is stable. Left anterior descending and right coronary atherosclerosis. Right internal jugular central venous catheter terminates in the middle third of the SVC. Left  internal jugular central venous catheter terminates in the middle third of the SVC. Atherosclerotic nonaneurysmal thoracic aorta. Stable dilated main pulmonary artery (5.0 cm diameter). Mediastinum/Nodes: No discrete thyroid nodules. Unremarkable esophagus. No axillary adenopathy. Stable enlarged 1.8 cm lateral right prevascular node (series 5/image 48). No new pathologically enlarged mediastinal nodes. No discrete hilar adenopathy on these noncontrast images. Lungs/Pleura: No pneumothorax. Small right and trace left dependent pleural effusions. Dense consolidation with air bronchograms and volume loss in the dependent lower lobes bilaterally. Mild patchy ground-glass opacity in the peribronchovascular lungs bilaterally. No significant regions of bronchiectasis or frank honeycombing. No discrete lung masses or significant pulmonary nodules on these limited images. No evidence of significant air trapping or tracheobronchomalacia. Upper abdomen: Finely irregular liver surface, compatible with cirrhosis. Trace perihepatic ascites. Musculoskeletal: No aggressive appearing focal osseous lesions. Moderate thoracic spondylosis. IMPRESSION: 1. Markedly motion degraded scan, significantly limiting assessment. No findings to suggest underlying interstitial lung disease. 2. Dense consolidation with air bronchograms and volume loss in the dependent lower lobes bilaterally. Favor atelectasis, with a component of aspiration or pneumonia not excluded. 3. Small right and trace left dependent pleural effusions. 4. Moderate cardiomegaly. Chronic small to moderate pericardial effusion. Mild patchy ground-glass opacities in a peribronchovascular lungs, which may indicate mild pulmonary edema. 5. Stable dilated main pulmonary artery, suggesting chronic pulmonary arterial hypertension. 6. Cirrhosis. Trace perihepatic ascites. 7. Stable mild mediastinal lymphadenopathy, nonspecific, probably reactive. 8. Two-vessel coronary  atherosclerosis. 9. Aortic Atherosclerosis (ICD10-I70.0). Electronically Signed   By: Ilona Sorrel M.D.   On: 05/10/2019 15:38     Medications:     Scheduled Medications: . amoxicillin  500 mg Oral Q8H  . Chlorhexidine Gluconate Cloth  6 each Topical Daily  . ferrous sulfate  325 mg Oral Q breakfast  . gabapentin  600 mg Oral BID  . insulin aspart  0-20 Units Subcutaneous TID WC  . mouth rinse  15 mL Mouth Rinse BID  . pantoprazole (PROTONIX) IV  40 mg Intravenous QHS  . pravastatin  20 mg Oral Daily  . rivaroxaban  20 mg Oral Q supper  . sildenafil  20 mg Oral TID  . spironolactone  12.5 mg Oral Daily  . vitamin B-12  500 mcg Oral Daily    Infusions: . sodium chloride Stopped (05/10/19 1103)  . treprostinil (REMODULIN) infusion 81 ng/kg/min (05/10/19 1243)    PRN Medications: docusate sodium, lidocaine (PF), ondansetron (ZOFRAN) IV, polyethylene glycol   Assessment/Plan:   1. Shock, septic - Likely source lower extremity cellulitis.She also had hickman catheter in R upper chest (has been removed).  - Initial Blood Cultures + for Strep Group G.  - F/u cultures negative -  new Rt IJ hickman cath placed 5/5 - on abx. ID narrowed abx from ceftriaxone to amoxicillin. AF. WBC now normal - Off pressors - ID planning 14 days abx. They do not feel TEE necessary - respiratory status unlikely to tolerate  - No change  2. LE cellulitis - Much improved. Continue abx - Leg is wrapped  3. A/C Hypoxic Respiratory Failure - Multifactorial. Improving - Hires CT 5/6 as above. + consolidation/atx and edema. Needs BIPAP and HFNC  - weight rising. Nearly 30 pounds up from baseline - minimal response to lasix 80IV x 2  - increase lasix to 160 IV bid and add diamox. Hold off on metoalzone for now with low K   4. NSTEMI  -Trend 357>8978> 2856. Suspect demand ischemia - 2015 had myoview In EF 78% and no ischemia  - Not having chest pain. EF stable on echo - Ct chest with 2v CAD in  LAD and RCA - will plan coronary angio next week. Will have to hold Xarelto after tonight's night dose - off ASA with Xarelto - Add statin - No b-blocker with severe lung disease  5. Severe PAH with Cor Pulmonale: - On IV remodulin with [facility]. Need to continue - Sildenafil resumed 5/4 - New Hickman cath placed  6. PAF - in NSR. Back on NOAC  7. Acute on chronic diastolic HF - Plan as above. Diurese  8. NSVT; - keep K> 4.0 Mg > 2.0  - avoiding b-blocker with severe lung disease.  Length of Stay: 10   Glori Bickers MD 05/12/2019, 8:10 AM  Advanced Heart Failure Team Pager [phone] (M-F; [place])  Please contact [facility] for night-coverage after hours (4p -7a ) and weekends on [email or web address]

## 2019-05-12 NOTE — Progress Notes (Signed)
[name], female   DOB: 1960-06-23, 59 y.o.   MRN: [id number]         [facility]  Date of Admission:  05/02/2019   Total days of antibiotics 11        Day 4 amoxicillin         ASSESSMENT: She had moderately severe right leg cellulitis complicated by group G streptococcal bacteremia upon admission.  She is now afebrile and clinically improved.  The skin changes in her right leg are in the normal evolution of bad cellulitis and do not suggest worsening infection.  These changes will take weeks to resolve.  She will probably eventually have some superficial desquamation.  I still recommend stopping oral amoxicillin and another 3 days.  PLAN: 1. Complete 3 more days of amoxicillin 2. Please call if we can be of further assistance while she is here.  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 acetaZOLAMIDE  250 mg Oral BID  . amoxicillin  500 mg Oral Q8H  . Chlorhexidine Gluconate Cloth  6 each Topical Daily  . ferrous sulfate  325 mg Oral Q breakfast  . gabapentin  600 mg Oral BID  . insulin aspart  0-20 Units Subcutaneous TID WC  . mouth rinse  15 mL Mouth Rinse BID  . pantoprazole (PROTONIX) IV  40 mg Intravenous QHS  . potassium chloride  40 mEq Oral BID  . pravastatin  20 mg Oral Daily  . rivaroxaban  20 mg Oral Q supper  . sildenafil  20 mg Oral TID  . spironolactone  12.5 mg Oral Daily  . vitamin B-12  500 mcg Oral Daily   Continuous Infusions: . sodium chloride Stopped (05/10/19 1103)  . furosemide 160 mg (05/12/19 1101)  .  treprostinil (REMODULIN) infusion 81 ng/kg/min (05/12/19 1201)   PRN Meds:.docusate sodium, lidocaine (PF), ondansetron (ZOFRAN) IV, polyethylene glycol   SUBJECTIVE: She is feeling much better but she is upset about the way her right leg looks.  Review of Systems: Review of Systems  Constitutional: Negative for fever.  Musculoskeletal: Negative for joint pain.    Allergies  Allergen Reactions  . Aspirin     REACTION: airway swelling  . Codeine     REACTION: tingling in lips and hard breathing - had reaction at dentist - states "I can't take certain kinds of codeine" - happened maybe 10 yr ago  . Lisinopril     Cough  . Sulfonamide Derivatives     REACTION: airway swelling  . Latex Rash    OBJECTIVE: Vitals:   05/12/19 0403 05/12/19 0500 05/12/19 0806 05/12/19 0854  BP: (!) 98/54  112/88   Pulse: 89  93 87  Resp: 20  17   Temp: 98.4 F (36.9 C)  98 F (36.7 C)   TempSrc: Oral  Oral   SpO2: 92%  (!) 88% 93%  Weight:  130 kg     Body mass index is 49.97 kg/m.  Physical Exam Constitutional:      Comments: She is much more alert and  talkative than when I first met her earlier this week.  Her son is visiting.  Skin:    Comments: She has chronic venous stasis changes of her left lower leg.  The cellulitis of her right leg is evolving as expected.  There is no expansion of erythema beyond the borders that were present upon admission.  There are some new areas of ecchymoses.     Lab Results Lab Results  Component Value Date   WBC 10.7 (H) 05/12/2019   HGB 12.3 05/12/2019   HCT 39.0 05/12/2019   MCV 87.2 05/12/2019   PLT 166 05/12/2019    Lab Results  Component Value Date   CREATININE 0.72 05/12/2019   BUN 8 05/12/2019   NA 144 05/12/2019   K 3.3 (L) 05/12/2019   CL 101 05/12/2019   CO2 32 05/12/2019    Lab Results  Component Value Date   ALT 14 05/12/2019   AST 25 05/12/2019   ALKPHOS 54 05/12/2019   BILITOT 1.0 05/12/2019     Microbiology: Recent  Results (from the past 240 hour(s))  Culture, blood (Routine x 2)     Status: Abnormal   Collection Time: 05/02/19  7:30 PM   Specimen: BLOOD LEFT ARM  Result Value Ref Range Status   Specimen Description BLOOD LEFT ARM  Final   Special Requests   Final    BOTTLES DRAWN AEROBIC AND ANAEROBIC Blood Culture adequate volume   Culture  Setup Time   Final    GRAM POSITIVE COCCI IN CHAINS IN BOTH AEROBIC AND ANAEROBIC BOTTLES CRITICAL RESULT CALLED TO, READ BACK BY AND VERIFIED WITH: Criss Rosales PharmD 10:30 05/03/19 ([email or web address]) Performed at [facility], [place] [address], [place], [place] [address]    Culture STREPTOCOCCUS GROUP G (A)  Final   Report Status 05/05/2019 FINAL  Final   Organism ID, Bacteria STREPTOCOCCUS GROUP G  Final      Susceptibility   Streptococcus group g - MIC*    CLINDAMYCIN <=0.25 SENSITIVE Sensitive     AMPICILLIN <=0.25 SENSITIVE Sensitive     ERYTHROMYCIN <=0.12 SENSITIVE Sensitive     VANCOMYCIN 0.5 SENSITIVE Sensitive     CEFTRIAXONE <=0.12 SENSITIVE Sensitive     LEVOFLOXACIN 0.5 SENSITIVE Sensitive     PENICILLIN Value in next row Sensitive      SENSITIVE0.06    * STREPTOCOCCUS GROUP G  Blood Culture ID Panel (Reflexed)     Status: Abnormal   Collection Time: 05/02/19  7:30 PM  Result Value Ref Range Status   Enterococcus species NOT DETECTED NOT DETECTED Final   Listeria monocytogenes NOT DETECTED NOT DETECTED Final   Staphylococcus species NOT DETECTED NOT DETECTED Final   Staphylococcus aureus (BCID) NOT DETECTED NOT DETECTED Final   Streptococcus species DETECTED (A) NOT DETECTED Final    Comment: Not Enterococcus species, Streptococcus agalactiae, Streptococcus pyogenes, or Streptococcus pneumoniae. CRITICAL RESULT CALLED TO, READ BACK BY AND VERIFIED WITH: Criss Rosales PharmD 10:30 05/03/19 ([email or web address])    Streptococcus agalactiae NOT DETECTED NOT DETECTED Final   Streptococcus pneumoniae NOT DETECTED NOT DETECTED Final   Streptococcus pyogenes NOT  DETECTED NOT DETECTED Final   Acinetobacter baumannii NOT DETECTED NOT DETECTED Final   Enterobacteriaceae species NOT DETECTED NOT DETECTED Final   Enterobacter cloacae complex NOT DETECTED NOT DETECTED Final   Escherichia coli NOT DETECTED NOT DETECTED Final   Klebsiella oxytoca NOT DETECTED NOT DETECTED Final   Klebsiella pneumoniae NOT DETECTED NOT DETECTED Final   Proteus species NOT  DETECTED NOT DETECTED Final   Serratia marcescens NOT DETECTED NOT DETECTED Final   Haemophilus influenzae NOT DETECTED NOT DETECTED Final   Neisseria meningitidis NOT DETECTED NOT DETECTED Final   Pseudomonas aeruginosa NOT DETECTED NOT DETECTED Final   Candida albicans NOT DETECTED NOT DETECTED Final   Candida glabrata NOT DETECTED NOT DETECTED Final   Candida krusei NOT DETECTED NOT DETECTED Final   Candida parapsilosis NOT DETECTED NOT DETECTED Final   Candida tropicalis NOT DETECTED NOT DETECTED Final    Comment: Performed at [facility], [place] [address] [place], [place] [address]  Culture, blood (Routine x 2)     Status: Abnormal   Collection Time: 05/02/19  7:49 PM   Specimen: BLOOD LEFT HAND  Result Value Ref Range Status   Specimen Description BLOOD LEFT HAND  Final   Special Requests   Final    BOTTLES DRAWN AEROBIC ONLY Blood Culture adequate volume   Culture  Setup Time   Final    GRAM POSITIVE COCCI IN PAIRS AND CHAINS AEROBIC BOTTLE ONLY CRITICAL VALUE NOTED.  VALUE IS CONSISTENT WITH PREVIOUSLY REPORTED AND CALLED VALUE.    Culture (A)  Final    STREPTOCOCCUS GROUP G SUSCEPTIBILITIES PERFORMED ON PREVIOUS CULTURE WITHIN THE LAST 5 DAYS. Performed at [facility], [place] [address], [place], [place] [address]    Report Status 05/05/2019 FINAL  Final  Respiratory Panel by RT PCR (Flu A&B, Covid) - Nasopharyngeal Swab     Status: None   Collection Time: 05/02/19  9:11 PM   Specimen: Nasopharyngeal Swab  Result Value Ref Range Status   SARS Coronavirus 2 by RT PCR  NEGATIVE NEGATIVE Final    Comment: (NOTE) SARS-CoV-2 target nucleic acids are NOT DETECTED. The SARS-CoV-2 RNA is generally detectable in upper respiratoy specimens during the acute phase of infection. The lowest concentration of SARS-CoV-2 viral copies this assay can detect is 131 copies/mL. A negative result does not preclude SARS-Cov-2 infection and should not be used as the sole basis for treatment or other patient management decisions. A negative result may occur with  improper specimen collection/handling, submission of specimen other than nasopharyngeal swab, presence of viral mutation(s) within the areas targeted by this assay, and inadequate number of viral copies (<131 copies/mL). A negative result must be combined with clinical observations, patient history, and epidemiological information. The expected result is Negative. Fact Sheet for Patients:  PinkCheek.be Fact Sheet for Healthcare Providers:  GravelBags.it This test is not yet ap proved or cleared by the [place] FDA and  has been authorized for detection and/or diagnosis of SARS-CoV-2 by FDA under an Emergency Use Authorization (EUA). This EUA will remain  in effect (meaning this test can be used) for the duration of the COVID-19 declaration under Section 564(b)(1) of the Act, 21 U.S.C. section 360bbb-3(b)(1), unless the authorization is terminated or revoked sooner.    Influenza A by PCR NEGATIVE NEGATIVE Final   Influenza B by PCR NEGATIVE NEGATIVE Final    Comment: (NOTE) The Xpert Xpress SARS-CoV-2/FLU/RSV assay is intended as an aid in  the diagnosis of influenza from Nasopharyngeal swab specimens and  should not be used as a sole basis for treatment. Nasal washings and  aspirates are unacceptable for Xpert Xpress SARS-CoV-2/FLU/RSV  testing. Fact Sheet for Patients: PinkCheek.be Fact Sheet for Healthcare  Providers: GravelBags.it This test is not yet approved or cleared by the [place] FDA and  has been authorized for detection and/or diagnosis of SARS-CoV-2  by  FDA under an Emergency Use Authorization (EUA). This EUA will remain  in effect (meaning this test can be used) for the duration of the  Covid-19 declaration under Section 564(b)(1) of the Act, 21  U.S.C. section 360bbb-3(b)(1), unless the authorization is  terminated or revoked. Performed at [facility], [place] [address], [place], [place] [address]   MRSA PCR Screening     Status: None   Collection Time: 05/03/19  4:41 AM   Specimen: Nasopharyngeal  Result Value Ref Range Status   MRSA by PCR NEGATIVE NEGATIVE Final    Comment:        The GeneXpert MRSA Assay (FDA approved for NASAL specimens only), is one component of a comprehensive MRSA colonization surveillance program. It is not intended to diagnose MRSA infection nor to guide or monitor treatment for MRSA infections. Performed at [facility], [place] [address], [place], [place] [address]   C Difficile Quick Screen w PCR reflex     Status: None   Collection Time: 05/03/19 12:43 PM   Specimen: STOOL  Result Value Ref Range Status   C Diff antigen NEGATIVE NEGATIVE Final   C Diff toxin NEGATIVE NEGATIVE Final   C Diff interpretation No C. difficile detected.  Final    Comment: Performed at [facility], [place] [address] [place], [place] [address]  Gastrointestinal Panel by PCR , Stool     Status: None   Collection Time: 05/03/19 12:43 PM   Specimen: STOOL  Result Value Ref Range Status   Campylobacter species NOT DETECTED NOT DETECTED Final   Plesimonas shigelloides NOT DETECTED NOT DETECTED Final   Salmonella species NOT DETECTED NOT DETECTED Final   Yersinia enterocolitica NOT DETECTED NOT DETECTED Final   Vibrio species NOT DETECTED NOT DETECTED Final   Vibrio cholerae NOT DETECTED NOT DETECTED Final    Enteroaggregative E coli (EAEC) NOT DETECTED NOT DETECTED Final   Enteropathogenic E coli (EPEC) NOT DETECTED NOT DETECTED Final   Enterotoxigenic E coli (ETEC) NOT DETECTED NOT DETECTED Final   Shiga like toxin producing E coli (STEC) NOT DETECTED NOT DETECTED Final   Shigella/Enteroinvasive E coli (EIEC) NOT DETECTED NOT DETECTED Final   Cryptosporidium NOT DETECTED NOT DETECTED Final   Cyclospora cayetanensis NOT DETECTED NOT DETECTED Final   Entamoeba histolytica NOT DETECTED NOT DETECTED Final   Giardia lamblia NOT DETECTED NOT DETECTED Final   Adenovirus F40/41 NOT DETECTED NOT DETECTED Final   Astrovirus NOT DETECTED NOT DETECTED Final   Norovirus GI/GII NOT DETECTED NOT DETECTED Final   Rotavirus A NOT DETECTED NOT DETECTED Final   Sapovirus (I, II, IV, and V) NOT DETECTED NOT DETECTED Final    Comment: Performed at [facility], [place] [place], [place] [address]  Respiratory Panel by PCR     Status: None   Collection Time: 05/03/19  1:40 PM   Specimen: Nasopharyngeal Swab; Respiratory  Result Value Ref Range Status   Adenovirus NOT DETECTED NOT DETECTED Final   Coronavirus 229E NOT DETECTED NOT DETECTED Final    Comment: (NOTE) The Coronavirus on the Respiratory Panel, DOES NOT test for the novel  Coronavirus (2019 nCoV)    Coronavirus HKU1 NOT DETECTED NOT DETECTED Final   Coronavirus NL63 NOT DETECTED NOT DETECTED Final   Coronavirus OC43 NOT DETECTED NOT DETECTED Final   Metapneumovirus NOT DETECTED NOT DETECTED Final   Rhinovirus / Enterovirus NOT DETECTED NOT DETECTED Final   Influenza A NOT DETECTED NOT  DETECTED Final   Influenza B NOT DETECTED NOT DETECTED Final   Parainfluenza Virus 1 NOT DETECTED NOT DETECTED Final   Parainfluenza Virus 2 NOT DETECTED NOT DETECTED Final   Parainfluenza Virus 3 NOT DETECTED NOT DETECTED Final   Parainfluenza Virus 4 NOT DETECTED NOT DETECTED Final   Respiratory Syncytial Virus NOT DETECTED NOT DETECTED Final    Bordetella pertussis NOT DETECTED NOT DETECTED Final   Chlamydophila pneumoniae NOT DETECTED NOT DETECTED Final   Mycoplasma pneumoniae NOT DETECTED NOT DETECTED Final    Comment: Performed at [facility], [place] [address], [place], [place] [address]  Culture, blood (routine x 2)     Status: None   Collection Time: 05/06/19 11:50 AM   Specimen: BLOOD  Result Value Ref Range Status   Specimen Description BLOOD RIGHT ANTECUBITAL  Final   Special Requests   Final    BOTTLES DRAWN AEROBIC ONLY Blood Culture results may not be optimal due to an inadequate volume of blood received in culture bottles   Culture   Final    NO GROWTH 5 DAYS Performed at [facility], [place] [address] [place], [place] [address]    Report Status 05/11/2019 FINAL  Final  Culture, blood (routine x 2)     Status: None   Collection Time: 05/06/19 11:57 AM   Specimen: BLOOD RIGHT HAND  Result Value Ref Range Status   Specimen Description BLOOD RIGHT HAND  Final   Special Requests   Final    BOTTLES DRAWN AEROBIC AND ANAEROBIC Blood Culture adequate volume   Culture   Final    NO GROWTH 5 DAYS Performed at [facility], [place] [address], [place], [place] [address]    Report Status 05/11/2019 FINAL  Final    Michel Bickers, MD [facility] [phone] pager   [phone] cell 05/12/2019, 3:03 PM

## 2019-05-12 NOTE — Progress Notes (Signed)
Pt had 15 beats Vtach. Pt is A&ox3. Denied CP, palpitation, and SOB. Pt RR= 31. Other VS are stable. Dr. Jeannene Patella was made aware of above and said to drawn BMP and magnesiuum

## 2019-05-12 NOTE — Progress Notes (Addendum)
I was called since patient has been transferred out of ICU but still under PCCM team and medicine team did not accept calls for this patient overnight Notified of PVCs No symptoms No chest pain or other symptoms and BP was stable Will get BMP and mag  Was on BIPAP at 50% fio2 , RT told me she increased it to 80% because patient desaturates when asleep Easily wakes up, follows all commands and O2 sat increases when she is awake and is apparently in no distress  I asked Rn to let me know when labs result in case repletion is needed  2.45 am - I followed up on labs Will give oral potassium and IV magnesium Would check levels this AM and decide further doses E link RN to notify bedside RN

## 2019-05-12 NOTE — Progress Notes (Signed)
PROGRESS NOTE    [name]  JXB:[id number] DOB: May 08, 1960 DOA: 05/02/2019 PCP: [name], MD   Brief Narrative: 59 year old with past medical history significant for severe PAH on Remodulin and sildenafil, OSA, morbid obesity, diabetes type 2, CKD who presents on 05/03/2019 with fever, productive cough, hypotensive tachycardic and worsening hypoxemia.  Chest x-ray with enlarged central vessel, cardiomegaly and suggestion of left lower lobe pneumonia.  She was admitted with acute on chronic hypoxic respiratory failure, severe sepsis secondary to pneumonia, found to have strep G bacteremia, severe pulmonary arterial hypertension.  She was treated with IV antibiotic and subsequently transitioned to amoxicillin.  She needs 14 days of amoxicillin per infectious disease recommendation.  She has been followed by the heart failure team, she is still requiring IV Laxis and still requiring high flow oxygen at 15 L.    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  Currently on high flow oxygen Continue with IV Laxis Continue with Remodulin and revatio.  Continue with BiPAP at night. Continue with amoxicillin.  Acute on chronic diastolic heart failure with cor pulmonale, severe underlying PAH; Continue with IV lasix  Appreciate Dr Phillip Heal help.   Right lower extremity cellulitis Vancomycin and cefazolin initially.  Subsequently  transition to amoxicillin 5/05. Patient report worsening redness of the right lower extremity.  Dr. Megan Salon for follow-up on patient today. Wound care consulted.  Septic shock, Sepsis secondary to pneumonia and lower extremity cellulitis, strep G bacteremia: She was initially treated with IV antibiotics, require IV pressors, Neo.  Currently off of IV pressors. Hickman catheter was exchanged. Needs 2 weeks of Amoxicillin.   STEMI ;evaded troponin: Suspect demand ischemia. Cardiology following  Hypokalemia: Replete orally Paroxysmal A. fib: On Xarelto.   Non Sustained V. tach: Replace potassium and magnesium.   Estimated body mass index is 48.93 kg/m as calculated from the following:   Height as of 12/12/18: 5' 3.5" (1.613 m).   Weight as of this encounter: 127.3 kg.   DVT prophylaxis: Xarelto Code Status: partial, yes to everything except intubation.  Family Communication: care discussed with patient Disposition Plan:  Patient is from: Home Anticipated d/c date: 3-4 days when respiratory status improved.  Anticipated discharge to; probably SNF. Will need PT eval Barriers to d/c or necessity for inpatient status: Patient is still requiring IV Lexis due to acute hypoxic respiratory failure requiring 15 L of oxygen  Consultants:   CCM  Cardiology  ID  Procedures:   None  Antimicrobials:  Amoxicillin 5/05  Subjective: She is feeling okay, still with shortness of breath also reports some improvement since admission. She thinks that her right leg redness is worse.  Edema have improved  Objective: Vitals:   05/11/19 2325 05/11/19 2345 05/12/19 0100 05/12/19 0403  BP: (!) 145/73  120/63 (!) 98/54  Pulse: 96 82  89  Resp: 20 (!) 22 (!) 25 20  Temp: 98.2 F (36.8 C)  98.1 F (36.7  C) 98.4 F (36.9 C)  TempSrc: Oral  Oral Oral  SpO2: 96% 94%  92%  Weight:        Intake/Output Summary (Last 24 hours) at 05/12/2019 0707 Last data filed at 05/12/2019 4159 Gross per  24 hour  Intake 840 ml  Output 1400 ml  Net -560 ml   Filed Weights   05/09/19 0345 05/10/19 0444 05/11/19 0500  Weight: 126.9 kg 126.9 kg 127.3 kg    Examination:  General exam: Appears calm and comfortable  Respiratory system: Bilateral crackles. Respiratory effort normal. Cardiovascular system: S1 & S2 heard, IRR. No JVD, murmurs, rubs, gallops or clicks. No pedal edema. Gastrointestinal system: Abdomen is nondistended, soft and nontender. No organomegaly or masses felt. Normal bowel sounds heard. Central nervous system: Alert and oriented. No focal neurological deficits. Extremities: Right lower extremity with redness, edema, ecchymosis    Data Reviewed: I have personally reviewed following labs and imaging studies  CBC: Recent Labs  Lab 05/05/19 1021 05/06/19 0804 05/09/19 0820 05/11/19 0522 05/12/19 0607  WBC 45.3* 37.9* 13.0* 9.5 10.7*  HGB 12.7 12.0 12.3 12.6 12.3  HCT 41.2 38.4 39.3 40.5 39.0  MCV 91.8 89.9 87.7 87.1 87.2  PLT 227 181 141* 138* 733   Basic Metabolic Panel: Recent Labs  Lab 05/06/19 1911 05/09/19 0820 05/10/19 1535 05/11/19 0522 05/12/19 0154  NA 148* 152* 149* 148* 144  K 3.5 3.1* 3.2* 3.6 3.3*  CL 108 109 107 108 101  CO2 29 31 32 32 32  GLUCOSE 104* 111* 153* 105* 130*  BUN 36* 25* _0 CREATININE 1.09* 0.73 0.67 0.69 0.72  CALCIUM 8.4* 8.4* 8.2* 8.1* 8.0*  MG  --   --  2.0  --  1.8   GFR: Estimated Creatinine Clearance: 101.8 mL/min (by C-G formula based on SCr of 0.72 mg/dL). Liver Function Tests: Recent Labs  Lab 05/09/19 0820 05/11/19 0522 05/12/19 0154  AST _1 ALT _2 ALKPHOS 57 60 54  BILITOT 0.7 1.2 1.0  PROT 5.9* 5.9* 6.1*  ALBUMIN 2.4* 2.4* 2.5*   No results for input(s): LIPASE, AMYLASE in the last 168 hours. No results for input(s): AMMONIA in the last 168 hours. Coagulation Profile: No results for input(s): INR, PROTIME in the last 168 hours. Cardiac Enzymes: No results for input(s):  CKTOTAL, CKMB, CKMBINDEX, TROPONINI in the last 168 hours. BNP (last 3 results) No results for input(s): PROBNP in the last 8760 hours. HbA1C: No results for input(s): HGBA1C in the last 72 hours. CBG: Recent Labs  Lab 05/11/19 0736 05/11/19 1206 05/11/19 1618 05/11/19 2013 05/11/19 2123  GLUCAP 85 129* 124* 135* 126*   Lipid Profile: No results for input(s): CHOL, HDL, LDLCALC, TRIG, CHOLHDL, LDLDIRECT in the last 72 hours. Thyroid Function Tests: No results for input(s): TSH, T4TOTAL, FREET4, T3FREE, THYROIDAB in the last 72 hours. Anemia Panel: No results for input(s): VITAMINB12, FOLATE, FERRITIN, TIBC, IRON, RETICCTPCT in the last 72 hours. Sepsis Labs: Recent Labs  Lab 05/05/19 1021 05/05/19 1232  LATICACIDVEN 1.2 0.9    Recent Results (from the past 240 hour(s))  Culture, blood (Routine x 2)     Status: Abnormal   Collection Time: 05/02/19  7:30 PM   Specimen: BLOOD LEFT ARM  Result Value Ref Range Status   Specimen Description BLOOD LEFT ARM  Final   Special Requests   Final    BOTTLES DRAWN AEROBIC AND ANAEROBIC Blood Culture adequate volume   Culture  Setup Time   Final    GRAM POSITIVE COCCI IN CHAINS IN BOTH AEROBIC AND ANAEROBIC BOTTLES CRITICAL RESULT CALLED TO, READ BACK BY AND VERIFIED WITH: Criss Rosales PharmD 10:30 05/03/19 ([email or web address]) Performed at [facility], [address] [place], [place] [address]    Culture STREPTOCOCCUS GROUP G (A)  Final   Report Status 05/05/2019 FINAL  Final   Organism ID, Bacteria STREPTOCOCCUS GROUP G  Final      Susceptibility   Streptococcus group g - MIC*    CLINDAMYCIN <=0.25 SENSITIVE Sensitive     AMPICILLIN <=0.25 SENSITIVE Sensitive     ERYTHROMYCIN <=0.12 SENSITIVE Sensitive     VANCOMYCIN 0.5 SENSITIVE Sensitive     CEFTRIAXONE <=0.12 SENSITIVE Sensitive     LEVOFLOXACIN 0.5 SENSITIVE Sensitive     PENICILLIN Value in next row Sensitive      SENSITIVE0.06    * STREPTOCOCCUS GROUP G  Blood Culture ID  Panel (Reflexed)     Status: Abnormal   Collection Time: 05/02/19  7:30 PM  Result Value Ref Range Status   Enterococcus species NOT DETECTED NOT DETECTED Final   Listeria monocytogenes NOT DETECTED NOT DETECTED Final   Staphylococcus species NOT DETECTED NOT DETECTED Final   Staphylococcus aureus (BCID) NOT DETECTED NOT DETECTED Final   Streptococcus species DETECTED (A) NOT DETECTED Final    Comment: Not Enterococcus species, Streptococcus agalactiae, Streptococcus pyogenes, or Streptococcus pneumoniae. CRITICAL RESULT CALLED TO, READ BACK BY AND VERIFIED WITH: Criss Rosales PharmD 10:30 05/03/19 ([email or web address])    Streptococcus agalactiae NOT DETECTED NOT DETECTED Final   Streptococcus pneumoniae NOT DETECTED NOT DETECTED Final   Streptococcus pyogenes NOT DETECTED NOT DETECTED Final   Acinetobacter baumannii NOT DETECTED NOT DETECTED Final   Enterobacteriaceae species NOT DETECTED NOT DETECTED Final   Enterobacter cloacae complex NOT DETECTED NOT DETECTED Final   Escherichia coli NOT DETECTED NOT DETECTED Final   Klebsiella oxytoca NOT DETECTED NOT DETECTED Final   Klebsiella pneumoniae NOT DETECTED NOT DETECTED Final   Proteus species NOT DETECTED NOT DETECTED Final   Serratia marcescens NOT DETECTED NOT DETECTED Final   Haemophilus influenzae NOT DETECTED NOT DETECTED Final   Neisseria meningitidis NOT DETECTED NOT DETECTED Final   Pseudomonas aeruginosa NOT DETECTED NOT DETECTED Final   Candida albicans NOT DETECTED NOT DETECTED Final   Candida glabrata NOT DETECTED NOT DETECTED Final   Candida krusei NOT DETECTED NOT DETECTED Final   Candida parapsilosis NOT DETECTED NOT DETECTED Final   Candida tropicalis NOT DETECTED NOT DETECTED Final    Comment: Performed at [facility], [place] [address] [place], [place] [address]  Culture, blood (Routine x 2)     Status: Abnormal   Collection Time: 05/02/19  7:49 PM   Specimen: BLOOD LEFT HAND  Result Value Ref Range Status   Specimen  Description BLOOD LEFT HAND  Final   Special Requests   Final    BOTTLES DRAWN AEROBIC ONLY Blood Culture adequate volume   Culture  Setup Time   Final    GRAM POSITIVE COCCI IN PAIRS AND CHAINS AEROBIC BOTTLE ONLY CRITICAL VALUE NOTED.  VALUE IS CONSISTENT WITH PREVIOUSLY REPORTED AND CALLED VALUE.    Culture (A)  Final    STREPTOCOCCUS GROUP G SUSCEPTIBILITIES PERFORMED ON PREVIOUS CULTURE WITHIN THE LAST 5 DAYS. Performed at [facility], [place] [address], [place], [place] [address]    Report Status 05/05/2019 FINAL  Final  Respiratory Panel by RT PCR (Flu A&B,  Covid) - Nasopharyngeal Swab     Status: None   Collection Time: 05/02/19  9:11 PM   Specimen: Nasopharyngeal Swab  Result Value Ref Range Status   SARS Coronavirus 2 by RT PCR NEGATIVE NEGATIVE Final    Comment: (NOTE) SARS-CoV-2 target nucleic acids are NOT DETECTED. The SARS-CoV-2 RNA is generally detectable in upper respiratoy specimens during the acute phase of infection. The lowest concentration of SARS-CoV-2 viral copies this assay can detect is 131 copies/mL. A negative result does not preclude SARS-Cov-2 infection and should not be used as the sole basis for treatment or other patient management decisions. A negative result may occur with  improper specimen collection/handling, submission of specimen other than nasopharyngeal swab, presence of viral mutation(s) within the areas targeted by this assay, and inadequate number of viral copies (<131 copies/mL). A negative result must be combined with clinical observations, patient history, and epidemiological information. The expected result is Negative. Fact Sheet for Patients:  PinkCheek.be Fact Sheet for Healthcare Providers:  GravelBags.it This test is not yet ap proved or cleared by the [place] FDA and  has been authorized for detection and/or diagnosis of SARS-CoV-2 by FDA under an Emergency  Use Authorization (EUA). This EUA will remain  in effect (meaning this test can be used) for the duration of the COVID-19 declaration under Section 564(b)(1) of the Act, 21 U.S.C. section 360bbb-3(b)(1), unless the authorization is terminated or revoked sooner.    Influenza A by PCR NEGATIVE NEGATIVE Final   Influenza B by PCR NEGATIVE NEGATIVE Final    Comment: (NOTE) The Xpert Xpress SARS-CoV-2/FLU/RSV assay is intended as an aid in  the diagnosis of influenza from Nasopharyngeal swab specimens and  should not be used as a sole basis for treatment. Nasal washings and  aspirates are unacceptable for Xpert Xpress SARS-CoV-2/FLU/RSV  testing. Fact Sheet for Patients: PinkCheek.be Fact Sheet for Healthcare Providers: GravelBags.it This test is not yet approved or cleared by the [place] FDA and  has been authorized for detection and/or diagnosis of SARS-CoV-2 by  FDA under an Emergency Use Authorization (EUA). This EUA will remain  in effect (meaning this test can be used) for the duration of the  Covid-19 declaration under Section 564(b)(1) of the Act, 21  U.S.C. section 360bbb-3(b)(1), unless the authorization is  terminated or revoked. Performed at [facility], [place] [address], [place], [place] [address]   MRSA PCR Screening     Status: None   Collection Time: 05/03/19  4:41 AM   Specimen: Nasopharyngeal  Result Value Ref Range Status   MRSA by PCR NEGATIVE NEGATIVE Final    Comment:        The GeneXpert MRSA Assay (FDA approved for NASAL specimens only), is one component of a comprehensive MRSA colonization surveillance program. It is not intended to diagnose MRSA infection nor to guide or monitor treatment for MRSA infections. Performed at [facility], [place] [address] [place], [place] [address]   C Difficile Quick Screen w PCR reflex     Status: None   Collection Time: 05/03/19 12:43 PM    Specimen: STOOL  Result Value Ref Range Status   C Diff antigen NEGATIVE NEGATIVE Final   C Diff toxin NEGATIVE NEGATIVE Final   C Diff interpretation No C. difficile detected.  Final    Comment: Performed at [facility], [place] [address] [place],  [address]  Gastrointestinal Panel by PCR , Stool     Status: None  Collection Time: 05/03/19 12:43 PM   Specimen: STOOL  Result Value Ref Range Status   Campylobacter species NOT DETECTED NOT DETECTED Final   Plesimonas shigelloides NOT DETECTED NOT DETECTED Final   Salmonella species NOT DETECTED NOT DETECTED Final   Yersinia enterocolitica NOT DETECTED NOT DETECTED Final   Vibrio species NOT DETECTED NOT DETECTED Final   Vibrio cholerae NOT DETECTED NOT DETECTED Final   Enteroaggregative E coli (EAEC) NOT DETECTED NOT DETECTED Final   Enteropathogenic E coli (EPEC) NOT DETECTED NOT DETECTED Final   Enterotoxigenic E coli (ETEC) NOT DETECTED NOT DETECTED Final   Shiga like toxin producing E coli (STEC) NOT DETECTED NOT DETECTED Final   Shigella/Enteroinvasive E coli (EIEC) NOT DETECTED NOT DETECTED Final   Cryptosporidium NOT DETECTED NOT DETECTED Final   Cyclospora cayetanensis NOT DETECTED NOT DETECTED Final   Entamoeba histolytica NOT DETECTED NOT DETECTED Final   Giardia lamblia NOT DETECTED NOT DETECTED Final   Adenovirus F40/41 NOT DETECTED NOT DETECTED Final   Astrovirus NOT DETECTED NOT DETECTED Final   Norovirus GI/GII NOT DETECTED NOT DETECTED Final   Rotavirus A NOT DETECTED NOT DETECTED Final   Sapovirus (I, II, IV, and V) NOT DETECTED NOT DETECTED Final    Comment: Performed at [facility], [place] [place], [place] [address]  Respiratory Panel by PCR     Status: None   Collection Time: 05/03/19  1:40 PM   Specimen: Nasopharyngeal Swab; Respiratory  Result Value Ref Range Status   Adenovirus NOT DETECTED NOT DETECTED Final   Coronavirus 229E NOT DETECTED NOT DETECTED Final    Comment:  (NOTE) The Coronavirus on the Respiratory Panel, DOES NOT test for the novel  Coronavirus (2019 nCoV)    Coronavirus HKU1 NOT DETECTED NOT DETECTED Final   Coronavirus NL63 NOT DETECTED NOT DETECTED Final   Coronavirus OC43 NOT DETECTED NOT DETECTED Final   Metapneumovirus NOT DETECTED NOT DETECTED Final   Rhinovirus / Enterovirus NOT DETECTED NOT DETECTED Final   Influenza A NOT DETECTED NOT DETECTED Final   Influenza B NOT DETECTED NOT DETECTED Final   Parainfluenza Virus 1 NOT DETECTED NOT DETECTED Final   Parainfluenza Virus 2 NOT DETECTED NOT DETECTED Final   Parainfluenza Virus 3 NOT DETECTED NOT DETECTED Final   Parainfluenza Virus 4 NOT DETECTED NOT DETECTED Final   Respiratory Syncytial Virus NOT DETECTED NOT DETECTED Final   Bordetella pertussis NOT DETECTED NOT DETECTED Final   Chlamydophila pneumoniae NOT DETECTED NOT DETECTED Final   Mycoplasma pneumoniae NOT DETECTED NOT DETECTED Final    Comment: Performed at [facility], [place]. [address] [place], [place] [address]  Culture, blood (routine x 2)     Status: None   Collection Time: 05/06/19 11:50 AM   Specimen: BLOOD  Result Value Ref Range Status   Specimen Description BLOOD RIGHT ANTECUBITAL  Final   Special Requests   Final    BOTTLES DRAWN AEROBIC ONLY Blood Culture results may not be optimal due to an inadequate volume of blood received in culture bottles   Culture   Final    NO GROWTH 5 DAYS Performed at [facility], [place] [address], [place], [place] [address]    Report Status 05/11/2019 FINAL  Final  Culture, blood (routine x 2)     Status: None   Collection Time: 05/06/19 11:57 AM   Specimen: BLOOD RIGHT HAND  Result Value Ref Range Status   Specimen Description BLOOD RIGHT HAND  Final  Special Requests   Final    BOTTLES DRAWN AEROBIC AND ANAEROBIC Blood Culture adequate volume   Culture   Final    NO GROWTH 5 DAYS Performed at [facility], [place] [address], [place], [place] [address]     Report Status 05/11/2019 FINAL  Final         Radiology Studies: CT Chest High Resolution  Result Date: 05/10/2019 CLINICAL DATA:  Inpatient. Hypoxemia. EXAM: CT CHEST WITHOUT CONTRAST TECHNIQUE: Multidetector CT imaging of the chest was performed following the standard protocol without intravenous contrast. High resolution imaging of the lungs, as well as inspiratory and expiratory imaging, was performed. COMPARISON:  05/04/2019 chest radiograph. 06/24/2008 chest CT. FINDINGS: Markedly motion degraded scan, significantly limiting assessment. Cardiovascular: Moderate cardiomegaly. Small to moderate pericardial effusion is stable. Left anterior descending and right coronary atherosclerosis. Right internal jugular central venous catheter terminates in the middle third of the SVC. Left internal jugular central venous catheter terminates in the middle third of the SVC. Atherosclerotic nonaneurysmal thoracic aorta. Stable dilated main pulmonary artery (5.0 cm diameter). Mediastinum/Nodes: No discrete thyroid nodules. Unremarkable esophagus. No axillary adenopathy. Stable enlarged 1.8 cm lateral right prevascular node (series 5/image 48). No new pathologically enlarged mediastinal nodes. No discrete hilar adenopathy on these noncontrast images. Lungs/Pleura: No pneumothorax. Small right and trace left dependent pleural effusions. Dense consolidation with air bronchograms and volume loss in the dependent lower lobes bilaterally. Mild patchy ground-glass opacity in the peribronchovascular lungs bilaterally. No significant regions of bronchiectasis or frank honeycombing. No discrete lung masses or significant pulmonary nodules on these limited images. No evidence of significant air trapping or tracheobronchomalacia. Upper abdomen: Finely irregular liver surface, compatible with cirrhosis. Trace perihepatic ascites. Musculoskeletal: No aggressive appearing focal osseous lesions. Moderate thoracic spondylosis.  IMPRESSION: 1. Markedly motion degraded scan, significantly limiting assessment. No findings to suggest underlying interstitial lung disease. 2. Dense consolidation with air bronchograms and volume loss in the dependent lower lobes bilaterally. Favor atelectasis, with a component of aspiration or pneumonia not excluded. 3. Small right and trace left dependent pleural effusions. 4. Moderate cardiomegaly. Chronic small to moderate pericardial effusion. Mild patchy ground-glass opacities in a peribronchovascular lungs, which may indicate mild pulmonary edema. 5. Stable dilated main pulmonary artery, suggesting chronic pulmonary arterial hypertension. 6. Cirrhosis. Trace perihepatic ascites. 7. Stable mild mediastinal lymphadenopathy, nonspecific, probably reactive. 8. Two-vessel coronary atherosclerosis. 9. Aortic Atherosclerosis (ICD10-I70.0). Electronically Signed   By: Ilona Sorrel M.D.   On: 05/10/2019 15:38        Scheduled Meds: . amoxicillin  500 mg Oral Q8H  . Chlorhexidine Gluconate Cloth  6 each Topical Daily  . ferrous sulfate  325 mg Oral Q breakfast  . furosemide  80 mg Intravenous BID  . gabapentin  600 mg Oral BID  . insulin aspart  0-20 Units Subcutaneous TID WC  . mouth rinse  15 mL Mouth Rinse BID  . pantoprazole (PROTONIX) IV  40 mg Intravenous QHS  . potassium chloride  40 mEq Oral Once  . pravastatin  20 mg Oral Daily  . rivaroxaban  20 mg Oral Q supper  . sildenafil  20 mg Oral TID  . spironolactone  12.5 mg Oral Daily  . vitamin B-12  500 mcg Oral Daily   Continuous Infusions: . sodium chloride Stopped (05/10/19 1103)  . treprostinil (REMODULIN) infusion 81 ng/kg/min (05/10/19 1243)     LOS: 10 days    Time spent: 35 minutes    Awa Bachicha Desiree Lucy, MD [facility]   If 7PM-7AM, please contact night-coverage [email or web address]  05/12/2019, 7:07 AM

## 2019-05-13 LAB — CBC
HCT: 43.5 % (ref 36.0–46.0)
Hemoglobin: 13.3 g/dL (ref 12.0–15.0)
MCH: 26.7 pg (ref 26.0–34.0)
MCHC: 30.6 g/dL (ref 30.0–36.0)
MCV: 87.3 fL (ref 80.0–100.0)
Platelets: 210 10*3/uL (ref 150–400)
RBC: 4.98 MIL/uL (ref 3.87–5.11)
RDW: 17.5 % — ABNORMAL HIGH (ref 11.5–15.5)
WBC: 11.9 10*3/uL — ABNORMAL HIGH (ref 4.0–10.5)
nRBC: 0.3 % — ABNORMAL HIGH (ref 0.0–0.2)

## 2019-05-13 LAB — BASIC METABOLIC PANEL
Anion gap: 9 (ref 5–15)
BUN: 10 mg/dL (ref 6–20)
CO2: 32 mmol/L (ref 22–32)
Calcium: 8.5 mg/dL — ABNORMAL LOW (ref 8.9–10.3)
Chloride: 102 mmol/L (ref 98–111)
Creatinine, Ser: 0.86 mg/dL (ref 0.44–1.00)
GFR calc Af Amer: >60 mL/min (ref >60)
GFR calc non Af Amer: >60 mL/min (ref >60)
Glucose, Bld: 115 mg/dL — ABNORMAL HIGH (ref 70–99)
Potassium: 3.3 mmol/L — ABNORMAL LOW (ref 3.5–5.1)
Sodium: 143 mmol/L (ref 135–145)

## 2019-05-13 LAB — GLUCOSE, CAPILLARY
Glucose-Capillary: 123 mg/dL — ABNORMAL HIGH (ref 70–99)
Glucose-Capillary: 117 mg/dL — ABNORMAL HIGH (ref 70–99)
Glucose-Capillary: 160 mg/dL — ABNORMAL HIGH (ref 70–99)
Glucose-Capillary: 131 mg/dL — ABNORMAL HIGH (ref 70–99)
Glucose-Capillary: 170 mg/dL — ABNORMAL HIGH (ref 70–99)

## 2019-05-13 MED ORDER — POTASSIUM CHLORIDE CRYS ER 20 MEQ PO TBCR
40.0000 meq | EXTENDED_RELEASE_TABLET | Freq: Once | ORAL | Status: AC
  Administered 2019-05-13: 40 meq via ORAL
  Filled 2019-05-13: qty 2

## 2019-05-13 MED ORDER — PANTOPRAZOLE SODIUM 40 MG PO TBEC
40.0000 mg | DELAYED_RELEASE_TABLET | Freq: Every day | ORAL | Status: DC
  Administered 2019-05-13 – 2019-05-23 (×11): 40 mg via ORAL
  Filled 2019-05-13 (×11): qty 1

## 2019-05-13 NOTE — Progress Notes (Signed)
Advanced Heart Failure Rounding Note   Subjective:     CT chest 5/6 with severe LL consolidation/atx + edema  Remains on HFNC. Lasix increased to 160 IV bid yesterday and diamox added. Very good diuresis (finally).   Down 3L. Weight recorded as down almost 20 pounds but not sure weights are completely accurate.   Feels her breathing is better. RLE feeling better. Afebrile.   Objective:   Weight Range:  Vital Signs:   Temp:  [98 F (36.7 C)-98.4 F (36.9 C)] 98.3 F (36.8 C) (05/09 0804) Pulse Rate:  [84-98] 84 (05/09 0806) Resp:  [18-22] 20 (05/09 0806) BP: (98-110)/(53-66) 98/66 (05/09 0804) SpO2:  [90 %-97 %] 96 % (05/09 0806) FiO2 (%):  [60 %] 60 % (05/09 0000) Weight:  [119.4 kg-120.6 kg] 120.6 kg (05/09 0700) Last BM Date: 05/12/19  Weight change: Filed Weights   05/12/19 0500 05/13/19 0500 05/13/19 0700  Weight: 130 kg 119.4 kg 120.6 kg    Intake/Output:   Intake/Output Summary (Last 24 hours) at 05/13/2019 1010 Last data filed at 05/13/2019 0916 Gross per 24 hour  Intake 892 ml  Output 3500 ml  Net -2608 ml     Physical Exam: General:  Morbidly obese woman sitting up in bed On HFNC HEENT: normal Neck: supple. JVP hard to see. Carotids 2+ bilat; no bruits. No lymphadenopathy or thryomegaly appreciated. Cor: PMI nondisplaced. Regular rate & rhythm. 2/6 TR Lungs: coarse Abdomen: obese soft, nontender, nondistended. No hepatosplenomegaly. No bruits or masses. Good bowel sounds. Extremities: no cyanosis, clubbing, rash. RLE improving. Mild erythema + skin sloughing. 1+ edema on left Neuro: alert & orientedx3, cranial nerves grossly intact. moves all 4 extremities w/o difficulty. Affect pleasant  Telemetry: NSR 80-90s Personally reviewed   Labs: Basic Metabolic Panel: Recent Labs  Lab 05/09/19 0820 05/09/19 0820 05/10/19 1535 05/10/19 1535 05/11/19 0522 05/12/19 0154 05/13/19 0752  NA 152*  --  149*  --  148* 144 143  K 3.1*  --  3.2*  --  3.6  3.3* 3.3*  CL 109  --  107  --  108 101 102  CO2 31  --  32  --  32 32 32  GLUCOSE 111*  --  153*  --  105* 130* 115*  BUN 25*  --  15  --  _0 CREATININE 0.73  --  0.67  --  0.69 0.72 0.86  CALCIUM 8.4*   < > 8.2*   < > 8.1* 8.0* 8.5*  MG  --   --  2.0  --   --  1.8  --    < > = values in this interval not displayed.    Liver Function Tests: Recent Labs  Lab 05/09/19 0820 05/11/19 0522 05/12/19 0154  AST _1 ALT _2 ALKPHOS 57 60 54  BILITOT 0.7 1.2 1.0  PROT 5.9* 5.9* 6.1*  ALBUMIN 2.4* 2.4* 2.5*   No results for input(s): LIPASE, AMYLASE in the last 168 hours. No results for input(s): AMMONIA in the last 168 hours.  CBC: Recent Labs  Lab 05/09/19 0820 05/11/19 0522 05/12/19 0607 05/13/19 0602  WBC 13.0* 9.5 10.7* 11.9*  HGB 12.3 12.6 12.3 13.3  HCT 39.3 40.5 39.0 43.5  MCV 87.7 87.1 87.2 87.3  PLT 141* 138* 166 210    Cardiac Enzymes: No results for input(s): CKTOTAL, CKMB, CKMBINDEX, TROPONINI in the last 168 hours.  BNP: BNP (last 3 results) Recent  Labs    06/15/18 0744 05/02/19 2350 05/07/19 1112  BNP 49.0 441.1* 325.9*    ProBNP (last 3 results) No results for input(s): PROBNP in the last 8760 hours.    Other results:  Imaging: No results found.   Medications:     Scheduled Medications:  acetaZOLAMIDE  250 mg Oral BID   amoxicillin  500 mg Oral Q8H   Chlorhexidine Gluconate Cloth  6 each Topical Daily   ferrous sulfate  325 mg Oral Q breakfast   gabapentin  600 mg Oral BID   insulin aspart  0-20 Units Subcutaneous TID WC   mouth rinse  15 mL Mouth Rinse BID   pantoprazole  40 mg Oral QHS   potassium chloride  40 mEq Oral BID   pravastatin  20 mg Oral Daily   rivaroxaban  20 mg Oral Q supper   sildenafil  20 mg Oral TID   spironolactone  12.5 mg Oral Daily   vitamin B-12  500 mcg Oral Daily    Infusions:  sodium chloride Stopped (05/10/19 1103)   furosemide 160 mg (05/13/19 0916)    treprostinil (REMODULIN) infusion 81 ng/kg/min (05/12/19 1201)    PRN Medications: docusate sodium, lidocaine (PF), ondansetron (ZOFRAN) IV, polyethylene glycol   Assessment/Plan:   1. Shock, septic - Likely source lower extremity cellulitis.She also had hickman catheter in R upper chest (has been removed).  - Initial Blood Cultures + for Strep Group G.  - F/u cultures negative - new Rt IJ hickman cath placed 5/5 - on abx. ID narrowed abx from ceftriaxone to amoxicillin. AF. WBC now normal - Off pressors - ID planning 14 days abx. They do not feel TEE necessary - respiratory status unlikely to tolerate  - No change  2. LE cellulitis - Much improved. Continue abx - Leg is wrapped  3. A/C Hypoxic Respiratory Failure - Multifactorial. Improving - Hires CT 5/6 as above. + consolidation/atx and edema. Needs BIPAP and HFNC  - lasix increased to 160 IV bid and added diamox. Excellent response. Now about 5 pounds from baseline weight (260). Creatinine stable. Continue diuresis one more day   4. NSTEMI  -Trend 875>6433> 2856. Suspect demand ischemia - 2015 had myoview In EF 78% and no ischemia  - No chest pain. EF stable on echo - Ct chest with 2v CAD in LAD and RCA - will plan R/L cath either tomorrow or Tuesday. Will hold Xarelto today - off ASA with Xarelto - Continue statid - No b-blocker with severe lung disease  5. Severe PAH with Cor Pulmonale: - On IV remodulin with [facility]. Need to continue - Sildenafil resumed 5/4 - New Hickman cath placed  6. PAF - in NSR. Back on NOAC. Will hold for cath  7. Acute on chronic diastolic HF - Plan as above. Diurese  8. PSVT; - keep K> 4.0 Mg > 2.0  - avoiding b-blocker with severe lung disease.  Length of Stay: 11   Glori Bickers MD 05/13/2019, 10:10 AM  Advanced Heart Failure Team Pager [phone] (M-F; [place])  Please contact [facility] for night-coverage after hours (4p -7a ) and weekends on [email or web address]

## 2019-05-13 NOTE — Progress Notes (Signed)
PROGRESS NOTE    [name]  OTL:[id number] DOB: May 19, 1960 DOA: 05/02/2019 PCP: [name], MD   Brief Narrative: 59 year old with past medical history significant for severe PAH on Remodulin and sildenafil, OSA, morbid obesity, diabetes type 2, CKD who presents on 05/03/2019 with fever, productive cough, hypotensive tachycardic and worsening hypoxemia.  Chest x-ray with enlarged central vessel, cardiomegaly and suggestion of left lower lobe pneumonia.  She was admitted with acute on chronic hypoxic respiratory failure, severe sepsis secondary to pneumonia, found to have strep G bacteremia, severe pulmonary arterial hypertension.  She was treated with IV antibiotic and subsequently transitioned to amoxicillin.  She needs 14 days of amoxicillin per infectious disease recommendation.  She has been followed by the heart failure team, she is still requiring IV Laxis and still requiring high flow oxygen at 10 L.    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  Currently on high flow oxygen. Continue with IV Laxis, Diamox Continue with Remodulin and revatio.  Continue with BiPAP at night. Continue with amoxicillin. Oxygen down to 10 L.   Acute on chronic diastolic heart failure with cor pulmonale, severe underlying PAH; Continue with IV lasix , demadox.  Appreciate Dr Phillip Heal help.  Weight; 286---265  Right lower extremity  cellulitis Vancomycin and cefazolin initially.  Subsequently transition to amoxicillin 5/05. Appreciate Dr Megan Salon, he recommend 3 more days of amoxicillin, changes of skin is normal evolution of cellulitis. Wound care consulted.  Septic shock, Sepsis secondary to pneumonia and lower extremity cellulitis, strep G bacteremia: She was initially treated with IV antibiotics, require IV pressors, Neo.  Currently off of IV pressors. Hickman catheter was exchanged. Needs 2 weeks of Amoxicillin.   STEMI ;evaded troponin: Suspect demand ischemia. Cardiology following  Hypokalemia: Replete orally Paroxysmal A. fib: On Xarelto.   Non Sustained V. tach: Replace potassium and magnesium.   Estimated body mass index is 47.1 kg/m as calculated from the following:   Height as of this encounter: _0  (1.6 m).   Weight as of this encounter: 120.6 kg.   DVT prophylaxis: Xarelto Code Status: partial, yes to everything except intubation.  Family Communication: care discussed with patient Disposition Plan:  Patient is from: Home Anticipated d/c date: 3-4 days when respiratory status improved.  Anticipated discharge to; probably SNF. Will need PT eval Barriers to d/c or necessity for inpatient status: Patient is still requiring IV Lexis due to acute hypoxic respiratory failure requiring 15 L of oxygen  Consultants:   CCM  Cardiology  ID  Procedures:   None  Antimicrobials:  Amoxicillin 5/05  Subjective: Breathing better, oxygen down to 10/  Objective: Vitals:   05/13/19 0000 05/13/19 0349 05/13/19 0500 05/13/19 0700  BP: (!) 106/53 (!) 105/57    Pulse: 90 89    Resp: (!) 21 (!) 21    Temp:  98.4 F (36.9 C)    TempSrc:  Axillary    SpO2: 95%  95%    Weight:   119.4 kg 120.6 kg  Height:   _0  (1.6 m) _1  (1.6 m)    Intake/Output Summary (Last 24 hours) at 05/13/2019 0716 Last data filed at 05/13/2019 0600 Gross per 24 hour  Intake 892 ml  Output 3950 ml  Net -3058 ml    Filed Weights   05/12/19 0500 05/13/19 0500 05/13/19 0700  Weight: 130 kg 119.4 kg 120.6 kg    Examination:  General exam: Obese Respiratory system:Blateral crakles Cardiovascular system: S 1, S 2 RRR Gastrointestinal system: BS present, soft, nt Central nervous system: alert Extremities: Right lower extremity with redness, edema, ecchymosis    Data Reviewed: I have personally reviewed following labs and imaging studies  CBC: Recent Labs  Lab 05/06/19 0804 05/09/19 0820 05/11/19 0522 05/12/19 0607 05/13/19 0602  WBC 37.9* 13.0* 9.5 10.7* 11.9*  HGB 12.0 12.3 12.6 12.3 13.3  HCT 38.4 39.3 40.5 39.0 43.5  MCV 89.9 87.7 87.1 87.2 87.3  PLT 181 141* 138* 166 561   Basic Metabolic Panel: Recent Labs  Lab 05/06/19 1911 05/09/19 0820 05/10/19 1535 05/11/19 0522 05/12/19 0154  NA 148* 152* 149* 148* 144  K 3.5 3.1* 3.2* 3.6 3.3*  CL 108 109 107 108 101  CO2 29 31 32 32 32  GLUCOSE 104* 111* 153* 105* 130*  BUN 36* 25* _2 CREATININE 1.09* 0.73 0.67 0.69 0.72  CALCIUM 8.4* 8.4* 8.2* 8.1* 8.0*  MG  --   --  2.0  --  1.8   GFR: Estimated Creatinine Clearance: 97.6 mL/min (by C-G formula based on SCr of 0.72 mg/dL). Liver Function Tests: Recent Labs  Lab 05/09/19 0820 05/11/19 0522 05/12/19 0154  AST _3 ALT _4 ALKPHOS 57 60 54  BILITOT 0.7 1.2 1.0  PROT 5.9* 5.9* 6.1*  ALBUMIN 2.4* 2.4* 2.5*   No results for input(s): LIPASE, AMYLASE in the last 168 hours. No results for input(s): AMMONIA in the last 168 hours. Coagulation Profile: No results for input(s): INR, PROTIME in the last 168 hours. Cardiac Enzymes: No results for input(s): CKTOTAL, CKMB, CKMBINDEX, TROPONINI in the last 168 hours. BNP (last 3 results) No results for input(s): PROBNP in the last 8760 hours. HbA1C: No results for input(s): HGBA1C in the last 72 hours. CBG: Recent Labs  Lab 05/12/19 1256 05/12/19 1829 05/12/19 2005 05/12/19 2349 05/13/19 0347  GLUCAP  162* 165* 134* 147* 123*   Lipid Profile: No results for input(s): CHOL, HDL, LDLCALC, TRIG, CHOLHDL, LDLDIRECT in the last 72 hours. Thyroid Function Tests: No results for input(s): TSH, T4TOTAL, FREET4, T3FREE, THYROIDAB in the last 72 hours. Anemia Panel: No results for input(s): VITAMINB12, FOLATE, FERRITIN, TIBC, IRON, RETICCTPCT in the last 72 hours. Sepsis Labs: No results for input(s): PROCALCITON, LATICACIDVEN in the last 168 hours.  Recent Results (from the past 240 hour(s))  C Difficile Quick Screen w PCR reflex     Status: None   Collection Time: 05/03/19 12:43 PM   Specimen: STOOL  Result Value Ref Range Status   C Diff antigen NEGATIVE NEGATIVE Final   C Diff toxin NEGATIVE NEGATIVE Final   C Diff interpretation No C. difficile detected.  Final    Comment: Performed at [facility], [place] [address] [place], [place] [address]  Gastrointestinal Panel by PCR , Stool     Status: None   Collection Time: 05/03/19 12:43 PM   Specimen: STOOL  Result  Value Ref Range Status   Campylobacter species NOT DETECTED NOT DETECTED Final   Plesimonas shigelloides NOT DETECTED NOT DETECTED Final   Salmonella species NOT DETECTED NOT DETECTED Final   Yersinia enterocolitica NOT DETECTED NOT DETECTED Final   Vibrio species NOT DETECTED NOT DETECTED Final   Vibrio cholerae NOT DETECTED NOT DETECTED Final   Enteroaggregative E coli (EAEC) NOT DETECTED NOT DETECTED Final   Enteropathogenic E coli (EPEC) NOT DETECTED NOT DETECTED Final   Enterotoxigenic E coli (ETEC) NOT DETECTED NOT DETECTED Final   Shiga like toxin producing E coli (STEC) NOT DETECTED NOT DETECTED Final   Shigella/Enteroinvasive E coli (EIEC) NOT DETECTED NOT DETECTED Final   Cryptosporidium NOT DETECTED NOT DETECTED Final   Cyclospora cayetanensis NOT DETECTED NOT DETECTED Final   Entamoeba histolytica NOT DETECTED NOT DETECTED Final   Giardia lamblia NOT DETECTED NOT DETECTED Final   Adenovirus F40/41 NOT  DETECTED NOT DETECTED Final   Astrovirus NOT DETECTED NOT DETECTED Final   Norovirus GI/GII NOT DETECTED NOT DETECTED Final   Rotavirus A NOT DETECTED NOT DETECTED Final   Sapovirus (I, II, IV, and V) NOT DETECTED NOT DETECTED Final    Comment: Performed at [facility], [place] [place], [place] [address]  Respiratory Panel by PCR     Status: None   Collection Time: 05/03/19  1:40 PM   Specimen: Nasopharyngeal Swab; Respiratory  Result Value Ref Range Status   Adenovirus NOT DETECTED NOT DETECTED Final   Coronavirus 229E NOT DETECTED NOT DETECTED Final    Comment: (NOTE) The Coronavirus on the Respiratory Panel, DOES NOT test for the novel  Coronavirus (2019 nCoV)    Coronavirus HKU1 NOT DETECTED NOT DETECTED Final   Coronavirus NL63 NOT DETECTED NOT DETECTED Final   Coronavirus OC43 NOT DETECTED NOT DETECTED Final   Metapneumovirus NOT DETECTED NOT DETECTED Final   Rhinovirus / Enterovirus NOT DETECTED NOT DETECTED Final   Influenza A NOT DETECTED NOT DETECTED Final   Influenza B NOT DETECTED NOT DETECTED Final   Parainfluenza Virus 1 NOT DETECTED NOT DETECTED Final   Parainfluenza Virus 2 NOT DETECTED NOT DETECTED Final   Parainfluenza Virus 3 NOT DETECTED NOT DETECTED Final   Parainfluenza Virus 4 NOT DETECTED NOT DETECTED Final   Respiratory Syncytial Virus NOT DETECTED NOT DETECTED Final   Bordetella pertussis NOT DETECTED NOT DETECTED Final   Chlamydophila pneumoniae NOT DETECTED NOT DETECTED Final   Mycoplasma pneumoniae NOT DETECTED NOT DETECTED Final    Comment: Performed at [facility], [place]. [address] [place], [place] [address]  Culture, blood (routine x 2)     Status: None   Collection Time: 05/06/19 11:50 AM   Specimen: BLOOD  Result Value Ref Range Status   Specimen Description BLOOD RIGHT ANTECUBITAL  Final   Special Requests   Final    BOTTLES DRAWN AEROBIC ONLY Blood Culture results may not be optimal due to an inadequate volume of blood  received in culture bottles   Culture   Final    NO GROWTH 5 DAYS Performed at [facility], [place] [address], [place], [place] [address]    Report Status 05/11/2019 FINAL  Final  Culture, blood (routine x 2)     Status: None   Collection Time: 05/06/19 11:57 AM   Specimen: BLOOD RIGHT HAND  Result Value Ref Range Status   Specimen Description BLOOD RIGHT HAND  Final   Special Requests   Final    BOTTLES  DRAWN AEROBIC AND ANAEROBIC Blood Culture adequate volume   Culture   Final    NO GROWTH 5 DAYS Performed at [facility], [place] [address] [place], [place] [address]    Report Status 05/11/2019 FINAL  Final         Radiology Studies: No results found.      Scheduled Meds: . acetaZOLAMIDE  250 mg Oral BID  . amoxicillin  500 mg Oral Q8H  . Chlorhexidine Gluconate Cloth  6 each Topical Daily  . ferrous sulfate  325 mg Oral Q breakfast  . gabapentin  600 mg Oral BID  . insulin aspart  0-20 Units Subcutaneous TID WC  . mouth rinse  15 mL Mouth Rinse BID  . pantoprazole  40 mg Oral QHS  . potassium chloride  40 mEq Oral BID  . pravastatin  20 mg Oral Daily  . rivaroxaban  20 mg Oral Q supper  . sildenafil  20 mg Oral TID  . spironolactone  12.5 mg Oral Daily  . vitamin B-12  500 mcg Oral Daily   Continuous Infusions: . sodium chloride Stopped (05/10/19 1103)  . furosemide 160 mg (05/12/19 1744)  . treprostinil (REMODULIN) infusion 81 ng/kg/min (05/12/19 1201)     LOS: 11 days    Time spent: 35 minutes    Delane Stalling A Kristle Wesch, MD [facility]   If 7PM-7AM, please contact night-coverage [email or web address]  05/13/2019, 7:16 AM

## 2019-05-13 NOTE — Progress Notes (Signed)
Placed patient on BIPAP via [facility].

## 2019-05-14 ENCOUNTER — Encounter (HOSPITAL_COMMUNITY)
Admission: EM | Disposition: A | Discharge status: Skilled Nursing Facility | Payer: Self-pay | Source: Home / Self Care | Attending: Family Medicine

## 2019-05-14 DIAGNOSIS — I2510 Atherosclerotic heart disease of native coronary artery without angina pectoris: Secondary | ICD-10-CM

## 2019-05-14 DIAGNOSIS — I214 Non-ST elevation (NSTEMI) myocardial infarction: Secondary | ICD-10-CM

## 2019-05-14 HISTORY — PX: RIGHT/LEFT HEART CATH AND CORONARY ANGIOGRAPHY: CATH118266

## 2019-05-14 LAB — GLUCOSE, CAPILLARY
Glucose-Capillary: 129 mg/dL — ABNORMAL HIGH (ref 70–99)
Glucose-Capillary: 100 mg/dL — ABNORMAL HIGH (ref 70–99)
Glucose-Capillary: 110 mg/dL — ABNORMAL HIGH (ref 70–99)
Glucose-Capillary: 98 mg/dL (ref 70–99)
Glucose-Capillary: 94 mg/dL (ref 70–99)
Glucose-Capillary: 176 mg/dL — ABNORMAL HIGH (ref 70–99)
Glucose-Capillary: 125 mg/dL — ABNORMAL HIGH (ref 70–99)

## 2019-05-14 LAB — POCT I-STAT EG7
Acid-Base Excess: 5 mmol/L — ABNORMAL HIGH (ref 0.0–2.0)
Acid-Base Excess: 6 mmol/L — ABNORMAL HIGH (ref 0.0–2.0)
Bicarbonate: 32.2 mmol/L — ABNORMAL HIGH (ref 20.0–28.0)
Bicarbonate: 33.7 mmol/L — ABNORMAL HIGH (ref 20.0–28.0)
Calcium, Ion: 1.14 mmol/L — ABNORMAL LOW (ref 1.15–1.40)
Calcium, Ion: 1.19 mmol/L (ref 1.15–1.40)
HCT: 43 % (ref 36.0–46.0)
HCT: 43 % (ref 36.0–46.0)
Hemoglobin: 14.6 g/dL (ref 12.0–15.0)
Hemoglobin: 14.6 g/dL (ref 12.0–15.0)
O2 Saturation: 66 %
O2 Saturation: 68 %
Potassium: 3.3 mmol/L — ABNORMAL LOW (ref 3.5–5.1)
Potassium: 3.4 mmol/L — ABNORMAL LOW (ref 3.5–5.1)
Sodium: 140 mmol/L (ref 135–145)
Sodium: 141 mmol/L (ref 135–145)
TCO2: 34 mmol/L — ABNORMAL HIGH (ref 22–32)
TCO2: 36 mmol/L — ABNORMAL HIGH (ref 22–32)
pCO2, Ven: 57.1 mmHg (ref 44.0–60.0)
pCO2, Ven: 61.3 mmHg — ABNORMAL HIGH (ref 44.0–60.0)
pH, Ven: 7.348 (ref 7.250–7.430)
pH, Ven: 7.36 (ref 7.250–7.430)
pO2, Ven: 37 mmHg (ref 32.0–45.0)
pO2, Ven: 38 mmHg (ref 32.0–45.0)

## 2019-05-14 LAB — POCT I-STAT 7, (LYTES, BLD GAS, ICA,H+H)
Acid-base deficit: 6 mmol/L — ABNORMAL HIGH (ref 0.0–2.0)
Bicarbonate: 18.7 mmol/L — ABNORMAL LOW (ref 20.0–28.0)
Calcium, Ion: 0.43 mmol/L — CL (ref 1.15–1.40)
HCT: 25 % — ABNORMAL LOW (ref 36.0–46.0)
Hemoglobin: 8.5 g/dL — ABNORMAL LOW (ref 12.0–15.0)
O2 Saturation: 96 %
Potassium: <2 mmol/L — CL (ref 3.5–5.1)
Sodium: 159 mmol/L — ABNORMAL HIGH (ref 135–145)
TCO2: 20 mmol/L — ABNORMAL LOW (ref 22–32)
pCO2 arterial: 33.3 mmHg (ref 32.0–48.0)
pH, Arterial: 7.358 (ref 7.350–7.450)
pO2, Arterial: 83 mmHg (ref 83.0–108.0)

## 2019-05-14 LAB — BASIC METABOLIC PANEL
Anion gap: 7 (ref 5–15)
BUN: 15 mg/dL (ref 6–20)
CO2: 30 mmol/L (ref 22–32)
Calcium: 8 mg/dL — ABNORMAL LOW (ref 8.9–10.3)
Chloride: 101 mmol/L (ref 98–111)
Creatinine, Ser: 0.96 mg/dL (ref 0.44–1.00)
GFR calc Af Amer: >60 mL/min (ref >60)
GFR calc non Af Amer: >60 mL/min (ref >60)
Glucose, Bld: 108 mg/dL — ABNORMAL HIGH (ref 70–99)
Potassium: 3.3 mmol/L — ABNORMAL LOW (ref 3.5–5.1)
Sodium: 138 mmol/L (ref 135–145)

## 2019-05-14 LAB — CBC
HCT: 39.3 % (ref 36.0–46.0)
Hemoglobin: 12.2 g/dL (ref 12.0–15.0)
MCH: 27.1 pg (ref 26.0–34.0)
MCHC: 31 g/dL (ref 30.0–36.0)
MCV: 87.1 fL (ref 80.0–100.0)
Platelets: 243 10*3/uL (ref 150–400)
RBC: 4.51 MIL/uL (ref 3.87–5.11)
RDW: 17.6 % — ABNORMAL HIGH (ref 11.5–15.5)
WBC: 13.1 10*3/uL — ABNORMAL HIGH (ref 4.0–10.5)
nRBC: 0.2 % (ref 0.0–0.2)

## 2019-05-14 SURGERY — RIGHT/LEFT HEART CATH AND CORONARY ANGIOGRAPHY
Anesthesia: LOCAL

## 2019-05-14 MED ORDER — SODIUM CHLORIDE 0.9% FLUSH: 3.0000 mL | INTRAVENOUS | Status: DC | PRN

## 2019-05-14 MED ORDER — FENTANYL CITRATE (PF) 100 MCG/2ML IJ SOLN
INTRAMUSCULAR | Status: DC | PRN
  Administered 2019-05-14: 25 ug via INTRAVENOUS

## 2019-05-14 MED ORDER — HEPARIN SODIUM (PORCINE) 1000 UNIT/ML IJ SOLN
INTRAMUSCULAR | Status: DC | PRN
  Administered 2019-05-14: 5000 [IU] via INTRAVENOUS

## 2019-05-14 MED ORDER — SODIUM CHLORIDE 0.9% FLUSH
3.0000 mL | Freq: Two times a day (BID) | INTRAVENOUS | Status: DC
  Administered 2019-05-14 – 2019-05-24 (×18): 3 mL via INTRAVENOUS

## 2019-05-14 MED ORDER — HYDRALAZINE HCL 20 MG/ML IJ SOLN: 10.0000 mg | INTRAMUSCULAR | Status: AC | PRN

## 2019-05-14 MED ORDER — VERAPAMIL HCL 2.5 MG/ML IV SOLN
INTRAVENOUS | Status: AC
  Filled 2019-05-14: qty 2

## 2019-05-14 MED ORDER — SODIUM CHLORIDE 0.9 % IV SOLN: INTRAVENOUS | Status: DC

## 2019-05-14 MED ORDER — VERAPAMIL HCL 2.5 MG/ML IV SOLN
INTRAVENOUS | Status: DC | PRN
  Administered 2019-05-14: 10 mL via INTRA_ARTERIAL

## 2019-05-14 MED ORDER — SODIUM CHLORIDE 0.9 % IV SOLN: 250.0000 mL | INTRAVENOUS | Status: DC | PRN

## 2019-05-14 MED ORDER — LABETALOL HCL 5 MG/ML IV SOLN: 10.0000 mg | INTRAVENOUS | Status: AC | PRN

## 2019-05-14 MED ORDER — POTASSIUM CHLORIDE CRYS ER 20 MEQ PO TBCR: 40.0000 meq | EXTENDED_RELEASE_TABLET | Freq: Once | ORAL | Status: DC

## 2019-05-14 MED ORDER — HEPARIN (PORCINE) IN NACL 1000-0.9 UT/500ML-% IV SOLN
INTRAVENOUS | Status: DC | PRN
  Administered 2019-05-14 (×2): 500 mL

## 2019-05-14 MED ORDER — FENTANYL CITRATE (PF) 100 MCG/2ML IJ SOLN
INTRAMUSCULAR | Status: AC
  Filled 2019-05-14: qty 2

## 2019-05-14 MED ORDER — SODIUM CHLORIDE 0.9 % IV SOLN: INTRAVENOUS | Status: AC

## 2019-05-14 MED ORDER — ASPIRIN 81 MG PO CHEW
81.0000 mg | CHEWABLE_TABLET | ORAL | Status: DC
Start: 2019-05-15 — End: 2019-05-14

## 2019-05-14 MED ORDER — RIVAROXABAN 20 MG PO TABS
20.0000 mg | ORAL_TABLET | Freq: Every day | ORAL | Status: DC
  Administered 2019-05-14 – 2019-05-23 (×10): 20 mg via ORAL
  Filled 2019-05-14 (×11): qty 1

## 2019-05-14 MED ORDER — ONDANSETRON HCL 4 MG/2ML IJ SOLN: 4.0000 mg | Freq: Four times a day (QID) | INTRAMUSCULAR | Status: DC | PRN

## 2019-05-14 MED ORDER — HEPARIN SODIUM (PORCINE) 1000 UNIT/ML IJ SOLN
INTRAMUSCULAR | Status: AC
  Filled 2019-05-14: qty 1

## 2019-05-14 MED ORDER — APIXABAN 5 MG PO TABS: 5.0000 mg | ORAL_TABLET | Freq: Two times a day (BID) | ORAL | Status: DC

## 2019-05-14 MED ORDER — LIDOCAINE HCL (PF) 1 % IJ SOLN
INTRAMUSCULAR | Status: DC | PRN
  Administered 2019-05-14 (×3): 2 mL

## 2019-05-14 MED ORDER — LIDOCAINE HCL (PF) 1 % IJ SOLN
INTRAMUSCULAR | Status: AC
  Filled 2019-05-14: qty 30

## 2019-05-14 MED ORDER — ASPIRIN 81 MG PO CHEW
81.0000 mg | CHEWABLE_TABLET | ORAL | Status: DC
Start: 2019-05-14 — End: 2019-05-14

## 2019-05-14 MED ORDER — ACETAMINOPHEN 325 MG PO TABS
650.0000 mg | ORAL_TABLET | ORAL | Status: DC | PRN
  Administered 2019-05-14 – 2019-05-15 (×2): 650 mg via ORAL
  Filled 2019-05-14 (×2): qty 2

## 2019-05-14 MED ORDER — IOHEXOL 350 MG/ML SOLN
INTRAVENOUS | Status: DC | PRN
  Administered 2019-05-14: 75 mL

## 2019-05-14 MED ORDER — SODIUM CHLORIDE 0.9% FLUSH
3.0000 mL | Freq: Two times a day (BID) | INTRAVENOUS | Status: DC
  Administered 2019-05-14: 10 mL via INTRAVENOUS

## 2019-05-14 MED ORDER — HEPARIN (PORCINE) IN NACL 1000-0.9 UT/500ML-% IV SOLN
INTRAVENOUS | Status: AC
  Filled 2019-05-14: qty 1000

## 2019-05-14 MED ORDER — SODIUM CHLORIDE 0.9 % IV SOLN
INTRAVENOUS | Status: AC | PRN
  Administered 2019-05-14: 10 mL/h via INTRAVENOUS

## 2019-05-14 SURGICAL SUPPLY — 15 items
CATH 5FR JL3.5 JR4 ANG PIG MP (CATHETERS) ×1 IMPLANT
CATH BALLN WEDGE 5F 110CM (CATHETERS) ×1 IMPLANT
CATH INFINITI 5FR JL4 (CATHETERS) ×1 IMPLANT
DEVICE RAD COMP TR BAND LRG (VASCULAR PRODUCTS) ×1 IMPLANT
GLIDESHEATH SLEND SS 6F .021 (SHEATH) ×1 IMPLANT
GUIDEWIRE .025 260CM (WIRE) ×1 IMPLANT
GUIDEWIRE INQWIRE 1.5J.035X260 (WIRE) IMPLANT
INQWIRE 1.5J .035X260CM (WIRE) ×2
KIT HEART LEFT (KITS) ×1 IMPLANT
KIT MICROPUNCTURE NIT STIFF (SHEATH) ×1 IMPLANT
PACK CARDIAC CATHETERIZATION (CUSTOM PROCEDURE TRAY) ×2 IMPLANT
SHEATH GLIDE SLENDER 4/5FR (SHEATH) ×2 IMPLANT
SHEATH PROBE COVER 6X72 (BAG) ×1 IMPLANT
TRANSDUCER W/STOPCOCK (MISCELLANEOUS) ×2 IMPLANT
TUBING CIL FLEX 10 FLL-RA (TUBING) ×1 IMPLANT

## 2019-05-14 NOTE — Plan of Care (Signed)

## 2019-05-14 NOTE — Progress Notes (Signed)
Patient requested to keep the bipap on for now to sleep longer. RT will continue to monitor.

## 2019-05-14 NOTE — NC FL2 (Signed)
[place] LEVEL OF CARE SCREENING TOOL     IDENTIFICATION  Patient Name: [name] Birthdate: 1960-07-25 Sex: female Admission Date (Current Location): 05/02/2019  [facility] and [place] Number:  Herbalist and Address:  [facility], [place] [address], [place],  [address]      Provider Number:    Attending Physician Name and Address:  Elmarie Shiley, MD  Relative Name and Phone Number:  [place], [place]) [phone] [facility] Phone)    Current Level of Care: Hospital Recommended Level of Care: [place] Prior Approval Number:    Date Approved/Denied:   PASRR Number: [id number]  Discharge Plan: SNF    Current Diagnoses: Patient Active Problem List   Diagnosis Date Noted  . Cellulitis of leg, right 05/07/2019  . Streptococcal bacteremia 05/07/2019  . Chronic venous stasis 05/07/2019  . Elevated troponin 05/03/2019  . Sepsis ([place])   . Urinary incontinence 12/12/2018  . Atelectasis   . 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[place])   . Acute on chronic respiratory failure ([place]) 06/15/2018  . CAP (community acquired pneumonia) 06/15/2018  . Abdominal muscle strain, initial encounter 06/07/2018  . Ovarian cyst 05/19/2017  . Postmenopausal vaginal bleeding 03/21/2015  . Rash 10/31/2014  . Peripheral neuropathy ([place]) 09/27/2014  . Healthcare maintenance 09/27/2014  . Acute on chronic diastolic (congestive) heart failure ([place]) 06/25/2013  . Atrial flutter ([place]) 08/17/2012  . Pulmonary hypertension ([place]) 08/16/2012  . Shock ([place]) 07/13/2012  . Right heart failure, NYHA class 3 ([place]) 03/15/2012  . Allergic rhinitis 11/16/2011  . Diabetes ([place]) 10/15/2010  . Hyperlipidemia 10/15/2010  . Obesity 10/14/2010  . Obstructive sleep apnea 02/21/2009  . GERD 11/29/2005  . ANEMIA, B12 DEFICIENCY 10/19/2005    Orientation RESPIRATION BLADDER Height & Weight     Self, Time,  Situation, Place  O2(HFNC 10L/min) External catheter Weight: 267 lb 10.2 oz (121.4 kg) Height:  _0  (160 cm)  BEHAVIORAL SYMPTOMS/MOOD NEUROLOGICAL BOWEL NUTRITION STATUS  (none) (none) Continent Diet(see discharge summary)  AMBULATORY STATUS COMMUNICATION OF NEEDS Skin   Extensive Assist Verbally Normal                       Personal Care Assistance Level of Assistance  Bathing, Feeding, Dressing Bathing Assistance: Maximum assistance Feeding assistance: Independent Dressing Assistance: Maximum assistance     Functional Limitations Info  Sight, Speech, Hearing Sight Info: Adequate Hearing Info: Adequate Speech Info: Adequate    SPECIAL CARE FACTORS FREQUENCY  PT (By licensed PT), OT (By licensed OT)     PT Frequency: 5x/week OT Frequency: 5x/week            Contractures Contractures Info: Not present    Additional Factors Info  Code Status, Allergies Code Status Info: Partial code Allergies Info: Aspirin, Codein, Lisinop, Sulfonamide Derivatives, Latex           Current Medications (05/14/2019):  This is the current hospital active medication list Current Facility-Administered Medications  Medication Dose Route Frequency Provider Last Rate Last Admin  . 0.9 %  sodium chloride infusion  250 mL Intravenous Continuous Anders Simmonds, MD   Stopped at 05/10/19 1103  . 0.9 %  sodium chloride infusion  250 mL Intravenous PRN Bensimhon, [name], MD      . 0.9 %  sodium chloride infusion   Intravenous Continuous Bensimhon, [name], MD 10 mL/hr at 05/14/19 0611 New Bag at 05/14/19 1324  .  acetaZOLAMIDE (DIAMOX) tablet 250 mg  250 mg Oral BID Bensimhon, [name], MD   250 mg at 05/14/19 1056  . amoxicillin (AMOXIL) capsule 500 mg  500 mg Oral Q8H Susa Raring, [place]   500 mg at 05/14/19 1941  . Chlorhexidine Gluconate Cloth 2 % PADS 6 each  6 each Topical Daily Bennie Pierini, MD   6 each at 05/13/19 [phone]  . docusate sodium (COLACE) capsule 100 mg  100 mg  Oral BID PRN Shearon Stalls, Rahul P, PA-C      . ferrous sulfate tablet 325 mg  325 mg Oral Q breakfast Bennie Pierini, MD   325 mg at 05/14/19 1055  . furosemide (LASIX) 160 mg in dextrose 5 % 50 mL IVPB  160 mg Intravenous BID Bensimhon, [name], MD 66 mL/hr at 05/14/19 0927 160 mg at 05/14/19 0927  . gabapentin (NEURONTIN) capsule 600 mg  600 mg Oral BID Marianna Payment, MD   600 mg at 05/14/19 1055  . insulin aspart (novoLOG) injection 0-20 Units  0-20 Units Subcutaneous TID WC Juanito Doom, MD   3 Units at 05/13/19 1742  . lidocaine (PF) (XYLOCAINE) 1 % injection   Infiltration PRN Arne Cleveland, MD   10 mL at 05/09/19 1100  . MEDLINE mouth rinse  15 mL Mouth Rinse BID Spero Geralds, MD   15 mL at 05/14/19 1059  . ondansetron (ZOFRAN) injection 4 mg  4 mg Intravenous Q6H PRN Spero Geralds, MD      . pantoprazole (PROTONIX) EC tablet 40 mg  40 mg Oral QHS Regalado, Belkys A, MD   40 mg at 05/13/19 2125  . polyethylene glycol (MIRALAX / GLYCOLAX) packet 17 g  17 g Oral Daily PRN Desai, Rahul P, PA-C      . potassium chloride (KLOR-CON) CR tablet 40 mEq  40 mEq Oral BID Bensimhon, [name], MD   40 mEq at 05/14/19 1054  . potassium chloride SA (KLOR-CON) CR tablet 40 mEq  40 mEq Oral Once Clegg, Amy D, NP      . pravastatin (PRAVACHOL) tablet 20 mg  20 mg Oral Daily Bennie Pierini, MD   20 mg at 05/14/19 1057  . sildenafil (REVATIO) tablet 20 mg  20 mg Oral TID Bensimhon, [name], MD   20 mg at 05/14/19 1055  . sodium chloride flush (NS) 0.9 % injection 3 mL  3 mL Intravenous Q12H Bensimhon, [name], MD   10 mL at 05/14/19 1058  . sodium chloride flush (NS) 0.9 % injection 3 mL  3 mL Intravenous PRN Bensimhon, [name], MD      . spironolactone (ALDACTONE) tablet 12.5 mg  12.5 mg Oral Daily Bensimhon, [name], MD   12.5 mg at 05/14/19 1057  . treprostinil (REMODULIN) 35,000,000 ng in glycine diluent for epoprostenol 100 mL (350,000 ng/mL) infusion  81 ng/kg/min (Order-Specific)  Intravenous Q48H Audria Nine, DO 1.67 mL/hr at 05/14/19 1154 81 ng/kg/min at 05/14/19 1154  . vitamin B-12 (CYANOCOBALAMIN) tablet 500 mcg  500 mcg Oral Daily Bennie Pierini, MD   500 mcg at 05/14/19 1056     Discharge Medications: Please see discharge summary for a list of discharge medications.  Relevant Imaging Results:  Relevant Lab Results:   Additional Information [id number]  Bethann Berkshire, LCSW

## 2019-05-14 NOTE — Progress Notes (Signed)
OT Cancellation Note  Patient Details Name: [name] MRN: [id number] DOB: 30-Oct-1960   Cancelled Treatment:    Reason Eval/Treat Not Completed: Patient at procedure or test/ unavailable (cath lab), will follow up as able.  [name], OT Acute Rehabilitation Services Pager [phone] Office [phone]   [name] 05/14/2019, 1:29 PM

## 2019-05-14 NOTE — TOC Initial Note (Signed)
Transition of Care [facility]) - Initial/Assessment Note    Patient Details  Name: [name] MRN: [id number] Date of Birth: 1960/10/09  Transition of Care [facility]) CM/SW Contact:    Bethann Berkshire, [place] Phone Number: 05/14/2019, 12:41 PM  Clinical Narrative:                 CSW called pt's son Caselli,Michael regarding SNF consult. Son is okay with SNF and is okay with CSW faxing out referrals to local SNFS. CSW also provides pt with financial assistance phone number after pt inquired about changes in [place] policy.   Fl2 completed and referrals faxed.   Expected Discharge Plan: Skilled Nursing Facility Barriers to Discharge: Continued Medical Work up   Patient Goals and CMS Choice        Expected Discharge Plan and Services Expected Discharge Plan: Drain       Living arrangements for the past 2 months: Single Family Home                                      Prior Living Arrangements/Services Living arrangements for the past 2 months: Single Family Home Lives with:: Self Patient language and need for interpreter reviewed:: Yes        Need for Family Participation in Patient Care: Yes (Comment) Care giver support system in place?: No (comment)   Criminal Activity/Legal Involvement Pertinent to Current Situation/Hospitalization: No - Comment as needed  Activities of Daily Living      Permission Sought/Granted                  Emotional Assessment         Alcohol / Substance Use: Not Applicable Psych Involvement: No (comment)  Admission diagnosis:  Diarrhea of presumed infectious origin [R19.7] CAP (community acquired pneumonia) [J18.9] Acute on chronic respiratory failure with hypoxia (HCC) [J96.21] Fever, unspecified fever cause [R50.9] Patient Active Problem List   Diagnosis Date Noted  . Cellulitis of leg, right 05/07/2019  . Streptococcal bacteremia 05/07/2019  . Chronic venous stasis 05/07/2019  . Elevated troponin  05/03/2019  . Sepsis ([place])   . Urinary incontinence 12/12/2018  . Atelectasis   . 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 Acute on chronic respiratory failure ([place]) 06/15/2018  . CAP (community acquired pneumonia) 06/15/2018  . Abdominal muscle strain, initial encounter 06/07/2018  . Ovarian cyst 05/19/2017  . Postmenopausal vaginal bleeding 03/21/2015  . Rash 10/31/2014  . Peripheral neuropathy ([place]) 09/27/2014  . Healthcare maintenance 09/27/2014  . Acute on chronic diastolic (congestive) heart failure ([place]) 06/25/2013  . Atrial flutter ([place]) 08/17/2012  . Pulmonary hypertension ([place]) 08/16/2012  . Shock ([place]) 07/13/2012  . Right heart failure, NYHA class 3 ([place]) 03/15/2012  . Allergic rhinitis 11/16/2011  . Diabetes ([place]) 10/15/2010  . Hyperlipidemia 10/15/2010  . Obesity 10/14/2010  . Obstructive sleep apnea 02/21/2009  . GERD 11/29/2005  . ANEMIA, B12 DEFICIENCY 10/19/2005   PCP:  [name], MD Pharmacy:   [facility] [address] [place]), [place] - [address] [place]) [place] [address] Phone: [phone] Fax: [phone]     Social Determinants of Health (SDOH) Interventions    Readmission Risk Interventions No flowsheet data found.

## 2019-05-14 NOTE — Consult Note (Signed)
WOC Nurse Consult Note: Patient receiving care in [facility] 2W15. Reason for Consult: right leg cellulitis Wound type: no actual wounds observed, only discolored skin with peeling and patches of maroon tissue. No dressing in place at the time of my visit. Pressure Injury POA: Yes/No/NA Measurement: Wound bed: Drainage (amount, consistency, odor)  Periwound: Dressing procedure/placement/frequency: I have discontinued the order for kerlex and ace wraps.  The RLE is edematous, erythematous--however not as bright red as on my previous assessment--and no wound areas.  There is peeling skin scattered along the leg and patches of dark maroon areas.  None of these require a topical dressing. Thank you for the consult.  Discussed plan of care with the patient.  [place] nurse will not follow at this time.  Please re-consult the [place] team if needed.  Val Riles, RN, MSN, CWOCN, CNS-BC, pager [phone]

## 2019-05-14 NOTE — Interval H&P Note (Signed)
History and Physical Interval Note:  05/14/2019 1:14 PM  [name]  has presented today for surgery, with the diagnosis of NSTEMI & PAH.  The various methods of treatment have been discussed with the patient and family. After consideration of risks, benefits and other options for treatment, the patient has consented to  Procedure(s): RIGHT/LEFT HEART CATH AND CORONARY ANGIOGRAPHY (N/A) and possible coronary angioplasty as a surgical intervention.  The patient's history has been reviewed, patient examined, no change in status, stable for surgery.  I have reviewed the patient's chart and labs.  Questions were answered to the patient's satisfaction.     Sashay Felling

## 2019-05-14 NOTE — Progress Notes (Addendum)
Advanced Heart Failure Rounding Note   Subjective:     CT chest 5/6 with severe LL consolidation/atx + edema   Yesterday diuresed with 160 mg IV lasix twice daily + diamox. WEight up 2 pounds. Overall weight down ~30 pounds.   On Bipap this morning. Denies pain.     Objective:   Weight Range:  Vital Signs:   Temp:  [97.8 F (36.6 C)-99.1 F (37.3 C)] 98.2 F (36.8 C) (05/10 0743) Pulse Rate:  [81-101] 81 (05/10 0755) Resp:  [16-30] 24 (05/10 0755) BP: (93-161)/(55-143) 106/63 (05/10 0743) SpO2:  [90 %-96 %] 96 % (05/10 0755) FiO2 (%):  [80 %] 80 % (05/10 0412) Weight:  [121.4 kg] 121.4 kg (05/10 0600) Last BM Date: 05/13/19  Weight change: Filed Weights   05/13/19 0500 05/13/19 0700 05/14/19 0600  Weight: 119.4 kg 120.6 kg 121.4 kg    Intake/Output:   Intake/Output Summary (Last 24 hours) at 05/14/2019 1021 Last data filed at 05/14/2019 0927 Gross per 24 hour  Intake 140 ml  Output 1550 ml  Net -1410 ml     Physical Exam: General:  On Bipap. No resp difficulty HEENT: normal Neck: supple. Hard to assess JVP due to body habitus. Carotids 2+ bilat; no bruits. No lymphadenopathy or thryomegaly appreciated. Cor: PMI nondisplaced. Regular rate & rhythm. No rubs, gallops or murmurs. R upper chest tunneled cather Lungs: clear Abdomen: soft, nontender, nondistended. No hepatosplenomegaly. No bruits or masses. Good bowel sounds. Extremities: no cyanosis, clubbing, rash, edema Neuro: alert & orientedx3, cranial nerves grossly intact. moves all 4 extremities w/o difficulty. Affect pleasant  Telemetry: NSR 80-90s personally reviewed.   Labs: Basic Metabolic Panel: Recent Labs  Lab 05/10/19 1535 05/10/19 1535 05/11/19 0522 05/11/19 0522 05/12/19 0154 05/13/19 0752 05/14/19 0715  NA 149*  --  148*  --  144 143 138  K 3.2*  --  3.6  --  3.3* 3.3* 3.3*  CL 107  --  108  --  101 102 101  CO2 32  --  32  --  32 32 30  GLUCOSE 153*  --  105*  --  130* 115*  108*  BUN 15  --  10  --  _0 CREATININE 0.67  --  0.69  --  0.72 0.86 0.96  CALCIUM 8.2*   < > 8.1*   < > 8.0* 8.5* 8.0*  MG 2.0  --   --   --  1.8  --   --    < > = values in this interval not displayed.    Liver Function Tests: Recent Labs  Lab 05/09/19 0820 05/11/19 0522 05/12/19 0154  AST _1 ALT _2 ALKPHOS 57 60 54  BILITOT 0.7 1.2 1.0  PROT 5.9* 5.9* 6.1*  ALBUMIN 2.4* 2.4* 2.5*   No results for input(s): LIPASE, AMYLASE in the last 168 hours. No results for input(s): AMMONIA in the last 168 hours.  CBC: Recent Labs  Lab 05/09/19 0820 05/11/19 0522 05/12/19 0607 05/13/19 0602 05/14/19 0715  WBC 13.0* 9.5 10.7* 11.9* 13.1*  HGB 12.3 12.6 12.3 13.3 12.2  HCT 39.3 40.5 39.0 43.5 39.3  MCV 87.7 87.1 87.2 87.3 87.1  PLT 141* 138* 166 210 243    Cardiac Enzymes: No results for input(s): CKTOTAL, CKMB, CKMBINDEX, TROPONINI in the last 168 hours.  BNP: BNP (last 3 results) Recent Labs    06/15/18 0744 05/02/19 2350 05/07/19 1112  BNP  49.0 441.1* 325.9*    ProBNP (last 3 results) No results for input(s): PROBNP in the last 8760 hours.    Other results:  Imaging: No results found.   Medications:     Scheduled Medications: . acetaZOLAMIDE  250 mg Oral BID  . amoxicillin  500 mg Oral Q8H  . Chlorhexidine Gluconate Cloth  6 each Topical Daily  . ferrous sulfate  325 mg Oral Q breakfast  . gabapentin  600 mg Oral BID  . insulin aspart  0-20 Units Subcutaneous TID WC  . mouth rinse  15 mL Mouth Rinse BID  . pantoprazole  40 mg Oral QHS  . potassium chloride  40 mEq Oral BID  . pravastatin  20 mg Oral Daily  . sildenafil  20 mg Oral TID  . sodium chloride flush  3 mL Intravenous Q12H  . spironolactone  12.5 mg Oral Daily  . vitamin B-12  500 mcg Oral Daily    Infusions: . sodium chloride Stopped (05/10/19 1103)  . sodium chloride    . sodium chloride 10 mL/hr at 05/14/19 0611  . furosemide 160 mg (05/14/19 0927)  .  treprostinil (REMODULIN) infusion 81 ng/kg/min (05/12/19 1201)    PRN Medications: sodium chloride, docusate sodium, lidocaine (PF), ondansetron (ZOFRAN) IV, polyethylene glycol, sodium chloride flush   Assessment/Plan:   1. Shock, septic - Likely source lower extremity cellulitis.She also had hickman catheter in R upper chest (has been removed).  - Initial Blood Cultures + for Strep Group G.  - F/u cultures negative - new Rt IJ hickman cath placed 5/5 - on abx. ID narrowed abx from ceftriaxone to amoxicillin. AF. WBC up to 13 - Off pressors - ID planning 14 days abx. They do not feel TEE necessary - respiratory status unlikely to tolerate  - No change  2. LE cellulitis - Much improved. Continue abx - Leg is wrapped  3. A/C Hypoxic Respiratory Failure - Multifactorial. Improving - Hires CT 5/6 as above. + consolidation/atx and edema. Needs BIPAP and HFNC  - lasix increased to 160 IV bid and added diamox. Excellent response. Now about 5 pounds from baseline weight (260).   4. NSTEMI  -Trend 188>4166> 2856. Suspect demand ischemia - 2015 had myoview In EF 78% and no ischemia  - No chest pain. EF stable on echo - Ct chest with 2v CAD in LAD and RCA - Plan for cath today. No chest pain.  -  Will hold Xarelto today - off ASA with Xarelto - Continue statid - No b-blocker with severe lung disease  5. Severe PAH with Cor Pulmonale: - On IV remodulin with [facility]. Need to continue - Sildenafil resumed 5/4 - New Hickman cath placed this admit - RHC today   6. PAF - Maintaining NSR.  - Hold xarelto for cath.   7. Acute on chronic diastolic HF - Continue IV lasix + diamox.  - Can adjust diuretics after cath.   8. PSVT; - keep K> 4.0 Mg > 2.0  - avoiding b-blocker with severe lung disease.  Length of Stay: [employer] NP-C  05/14/2019, 10:21 AM  Advanced Heart Failure Team Pager [phone] (M-F; 7a - 4p)  Please contact [facility] for night-coverage after  hours (4p -7a ) and weekends on [email or web address]  Patient seen and examined with the above-signed Advanced Practice Provider and/or Housestaff. I personally reviewed laboratory data, imaging studies and relevant notes. I independently examined the patient and formulated the important aspects of the plan. I  have edited the note to reflect any of my changes or salient points. I have personally discussed the plan with the patient and/or family.  Feeling better with diuresis. Weight stable overnight. No CP. Renal function stable. Remains on IV remodulin.   General:  On HFNC No resp difficulty HEENT: normal Neck: supple. no obvious JVD.  + Hickman Carotids 2+ bilat; no bruits. No lymphadenopathy or thryomegaly appreciated. Cor: PMI nondisplaced. Regular rate & rhythm. 2/6 TR Lungs: coarse Abdomen: obese soft, nontender, nondistended. No hepatosplenomegaly. No bruits or masses. Good bowel sounds. Extremities: no cyanosis, clubbing, rash,  RLE wrapped + areas of skin sloughing. Erythema improved. Tr edema LLE Neuro: alert & orientedx3, cranial nerves grossly intact. moves all 4 extremities w/o difficulty. Affect pleasant  Improving slowly. Will plan R/L cath today to assess coronaries and reassess PAH and filling pressures. Supp K.   Glori Bickers, MD  12:12 PM

## 2019-05-14 NOTE — Progress Notes (Signed)
PT Cancellation Note  Patient Details Name: [name] MRN: [id number] DOB: 08/10/1960   Cancelled Treatment:    Reason Eval/Treat Not Completed: Patient at procedure or test/unavailable;Patient declined, no reason specified.  Pt has been in CATH lab all afternoon, now she declines OOB due to her "BUTT" hurts too much to sit in the chair, so come back tomorrow. 05/14/2019  Ginger Carne., PT Acute Rehabilitation Services [phone]  (pager) [phone]  (office)   [name] 05/14/2019, 5:07 PM

## 2019-05-14 NOTE — Progress Notes (Signed)
PROGRESS NOTE    [name]  ZOX:[id number] DOB: 10-19-1960 DOA: 05/02/2019 PCP: [name], MD   Brief Narrative: 59 year old with past medical history significant for severe PAH on Remodulin and sildenafil, OSA, morbid obesity, diabetes type 2, CKD who presents on 05/03/2019 with fever, productive cough, hypotensive tachycardic and worsening hypoxemia.  Chest x-ray with enlarged central vessel, cardiomegaly and suggestion of left lower lobe pneumonia.  She was admitted with acute on chronic hypoxic respiratory failure, severe sepsis secondary to pneumonia, found to have strep G bacteremia, severe pulmonary arterial hypertension.  She was treated with IV antibiotic and subsequently transitioned to amoxicillin.  She needs 14 days of amoxicillin per infectious disease recommendation.  She has been followed by the heart failure team, she is still requiring IV Laxis and still requiring high flow oxygen at 10 L.    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  Currently on high flow oxygen. Continue with IV Laxis, Diamox Continue with Remodulin and revatio.  Continue with BiPAP at night. Continue with amoxicillin. Oxygen down to 10 L.  For left and right Heart cath today.   Acute on chronic diastolic heart failure with cor pulmonale, severe underlying PAH; Continue with IV lasix , demadox.  Appreciate Dr Phillip Heal help.  Weight;  286---265---267 For cath today  Right lower extremity cellulitis Vancomycin and cefazolin initially.  Subsequently transition to amoxicillin 5/05. Appreciate Dr Megan Salon, he recommend 3 more days of amoxicillin, changes of skin is normal evolution of cellulitis. Wound care consulted. Monitor WBC.   Septic shock, Sepsis secondary to pneumonia and lower extremity cellulitis, strep G bacteremia: She was initially treated with IV antibiotics, require IV pressors, Neo.  Currently off of IV pressors. Hickman catheter was exchanged. Needs 2 weeks of Amoxicillin.   STEMI ;evaded troponin: Suspect demand ischemia. Cardiology following  Hypokalemia: Replete orally Paroxysmal A. fib: On Xarelto.   Non Sustained V. tach: Replace potassium and magnesium.   Estimated body mass index is 47.41 kg/m as calculated from the following:   Height as of this encounter: _0  (1.6 m).   Weight as of this encounter: 121.4 kg.   DVT prophylaxis: Xarelto Code Status: partial, yes to everything except intubation.  Family Communication: care discussed with patient Disposition Plan:  Patient is from: Home Anticipated d/c date: 3-4 days when respiratory status improved.  Anticipated discharge to; probably SNF Barriers to d/c or necessity for inpatient status: Patient is still requiring IV Laxis due to acute hypoxic respiratory failure requiring 10 L of oxygen. For heart cath today  Consultants:   CCM  Cardiology  ID  Procedures:   None  Antimicrobials:  Amoxicillin 5/05  Subjective: On BPAP. She is aware that Cardiology is planning cath today. She denies chest pain     Objective: Vitals:   05/14/19 0755 05/14/19 1051 05/14/19 1052 05/14/19 1147  BP:      Pulse: 81 87 86 81  Resp: Marland Kitchen)  _0 (!) 31  Temp:      TempSrc:      SpO2: 96% (!) 80% 91% 93%  Weight:      Height:        Intake/Output Summary (Last 24 hours) at 05/14/2019 1223 Last data filed at 05/14/2019 8841 Gross  per 24 hour  Intake 140 ml  Output 1550 ml  Net -1410 ml   Filed Weights   05/13/19 0500 05/13/19 0700 05/14/19 0600  Weight: 119.4 kg 120.6 kg 121.4 kg    Examination:  General exam: Obese Respiratory system: Bilateral crackles.  Cardiovascular system: S 1, S 2 RRR Gastrointestinal system: BS, present, soft, nt Central nervous system: Alert Extremities: Right lower extremity with redness, edema, ecchymosis    Data Reviewed: I have personally reviewed following labs and imaging studies  CBC: Recent Labs  Lab 05/09/19 0820 05/11/19 0522 05/12/19 0607 05/13/19 0602 05/14/19 0715  WBC 13.0* 9.5 10.7* 11.9* 13.1*  HGB 12.3 12.6 12.3 13.3 12.2  HCT 39.3 40.5 39.0 43.5 39.3  MCV 87.7 87.1 87.2 87.3 87.1  PLT 141* 138* 166 210 660   Basic Metabolic Panel: Recent Labs  Lab 05/10/19 1535 05/11/19 0522 05/12/19 0154 05/13/19 0752 05/14/19 0715  NA 149* 148* 144 143 138  K 3.2* 3.6 3.3* 3.3* 3.3*  CL 107 108 101 102 101  CO2 32 32 32 32 30  GLUCOSE 153* 105* 130* 115* 108*  BUN _1 CREATININE 0.67 0.69 0.72 0.86 0.96  CALCIUM 8.2* 8.1* 8.0* 8.5* 8.0*  MG 2.0  --  1.8  --   --    GFR: Estimated Creatinine Clearance: 81.7 mL/min (by C-G formula based on SCr of 0.96 mg/dL). Liver Function Tests: Recent Labs  Lab 05/09/19 0820 05/11/19 0522 05/12/19 0154  AST _2 ALT _3 ALKPHOS 57 60 54  BILITOT 0.7 1.2 1.0  PROT 5.9* 5.9* 6.1*  ALBUMIN 2.4* 2.4* 2.5*   No results for input(s): LIPASE, AMYLASE in the last 168 hours. No results for input(s): AMMONIA in the last 168 hours. Coagulation Profile: No results for input(s): INR, PROTIME in the last 168 hours. Cardiac Enzymes: No results for input(s): CKTOTAL, CKMB, CKMBINDEX, TROPONINI in the last 168 hours. BNP (last 3 results) No results for input(s): PROBNP in the last 8760 hours. HbA1C: No results for input(s): HGBA1C in the last 72 hours. CBG: Recent Labs  Lab 05/13/19 2116  05/14/19 0102 05/14/19 0418 05/14/19 0746 05/14/19 1143  GLUCAP 170* 129* 100* 110* 98   Lipid Profile: No results for input(s): CHOL, HDL, LDLCALC, TRIG, CHOLHDL, LDLDIRECT in the last 72 hours. Thyroid Function Tests: No results for input(s): TSH, T4TOTAL, FREET4, T3FREE, THYROIDAB in the last 72 hours. Anemia Panel: No results for input(s): VITAMINB12, FOLATE, FERRITIN, TIBC, IRON, RETICCTPCT in the last 72 hours. Sepsis Labs: No results for input(s): PROCALCITON, LATICACIDVEN in the last 168 hours.  Recent Results (from the past 240 hour(s))  Culture, blood (routine x 2)     Status: None   Collection Time: 05/06/19 11:50 AM   Specimen: BLOOD  Result Value Ref Range Status   Specimen Description BLOOD RIGHT ANTECUBITAL  Final   Special Requests   Final    BOTTLES DRAWN AEROBIC ONLY Blood Culture results may not be optimal due to an inadequate volume of blood received in culture bottles   Culture   Final    NO GROWTH 5 DAYS Performed at [facility], [place] [address], [place], [place] [address]    Report Status 05/11/2019 FINAL  Final  Culture, blood (routine x 2)     Status: None   Collection Time: 05/06/19 11:57 AM   Specimen: BLOOD RIGHT HAND  Result Value Ref Range Status   Specimen Description BLOOD RIGHT HAND  Final   Special Requests   Final    BOTTLES DRAWN AEROBIC AND ANAEROBIC Blood Culture adequate volume   Culture   Final    NO GROWTH 5 DAYS Performed at [facility], [place] [address] [place], [place] [address]    Report Status 05/11/2019 FINAL  Final         Radiology Studies: No results found.      Scheduled Meds: . acetaZOLAMIDE  250 mg Oral BID  . amoxicillin  500 mg Oral Q8H  . Chlorhexidine Gluconate Cloth  6 each Topical Daily  . ferrous sulfate  325 mg Oral Q breakfast  . gabapentin  600 mg Oral BID  . insulin aspart  0-20 Units Subcutaneous TID WC  . mouth rinse  15 mL Mouth Rinse BID  . pantoprazole  40 mg Oral QHS  .  potassium chloride  40 mEq Oral BID  . potassium chloride  40 mEq Oral Once  . pravastatin  20 mg Oral Daily  . sildenafil  20 mg Oral TID  . sodium chloride flush  3 mL Intravenous Q12H  . spironolactone  12.5 mg Oral Daily  . vitamin B-12  500 mcg Oral Daily   Continuous Infusions: . sodium chloride Stopped (05/10/19 1103)  . sodium chloride    . sodium chloride 10 mL/hr at 05/14/19 0611  . furosemide 160 mg (05/14/19 0927)  . treprostinil (REMODULIN) infusion 81 ng/kg/min (05/14/19 1154)     LOS: 12 days    Time spent: 35 minutes    [name] A Lenoria Narine, MD [facility]   If 7PM-7AM, please contact night-coverage [email or web address]  05/14/2019, 12:23 PM

## 2019-05-14 NOTE — Progress Notes (Signed)
TR BAND REMOVAL  LOCATION:    Radial rt radial  DEFLATED PER PROTOCOL:   yes  TIME BAND OFF / DRESSING APPLIED:    1630/gauze and tegaderm  SITE UPON ARRIVAL:    Level 0  SITE AFTER BAND REMOVAL:    Level 0  CIRCULATION SENSATION AND MOVEMENT:    Within Normal Limits :  Yes, rt hand and fingers warm and pink, sensation present, rt arm resting ,elevated on pillow.  COMMENTS:

## 2019-05-14 NOTE — H&P (View-Only) (Signed)
Advanced Heart Failure Rounding Note   Subjective:     CT chest 5/6 with severe LL consolidation/atx + edema   Yesterday diuresed with 160 mg IV lasix twice daily + diamox. WEight up 2 pounds. Overall weight down ~30 pounds.   On Bipap this morning. Denies pain.     Objective:   Weight Range:  Vital Signs:   Temp:  [97.8 F (36.6 C)-99.1 F (37.3 C)] 98.2 F (36.8 C) (05/10 0743) Pulse Rate:  [81-101] 81 (05/10 0755) Resp:  [16-30] 24 (05/10 0755) BP: (93-161)/(55-143) 106/63 (05/10 0743) SpO2:  [90 %-96 %] 96 % (05/10 0755) FiO2 (%):  [80 %] 80 % (05/10 0412) Weight:  [121.4 kg] 121.4 kg (05/10 0600) Last BM Date: 05/13/19  Weight change: Filed Weights   05/13/19 0500 05/13/19 0700 05/14/19 0600  Weight: 119.4 kg 120.6 kg 121.4 kg    Intake/Output:   Intake/Output Summary (Last 24 hours) at 05/14/2019 1021 Last data filed at 05/14/2019 0927 Gross per 24 hour  Intake 140 ml  Output 1550 ml  Net -1410 ml     Physical Exam: General:  On Bipap. No resp difficulty HEENT: normal Neck: supple. Hard to assess JVP due to body habitus. Carotids 2+ bilat; no bruits. No lymphadenopathy or thryomegaly appreciated. Cor: PMI nondisplaced. Regular rate & rhythm. No rubs, gallops or murmurs. R upper chest tunneled cather Lungs: clear Abdomen: soft, nontender, nondistended. No hepatosplenomegaly. No bruits or masses. Good bowel sounds. Extremities: no cyanosis, clubbing, rash, edema Neuro: alert & orientedx3, cranial nerves grossly intact. moves all 4 extremities w/o difficulty. Affect pleasant  Telemetry: NSR 80-90s personally reviewed.   Labs: Basic Metabolic Panel: Recent Labs  Lab 05/10/19 1535 05/10/19 1535 05/11/19 0522 05/11/19 0522 05/12/19 0154 05/13/19 0752 05/14/19 0715  NA 149*  --  148*  --  144 143 138  K 3.2*  --  3.6  --  3.3* 3.3* 3.3*  CL 107  --  108  --  101 102 101  CO2 32  --  32  --  32 32 30  GLUCOSE 153*  --  105*  --  130* 115*  108*  BUN 15  --  10  --  _0 CREATININE 0.67  --  0.69  --  0.72 0.86 0.96  CALCIUM 8.2*   < > 8.1*   < > 8.0* 8.5* 8.0*  MG 2.0  --   --   --  1.8  --   --    < > = values in this interval not displayed.    Liver Function Tests: Recent Labs  Lab 05/09/19 0820 05/11/19 0522 05/12/19 0154  AST _1 ALT _2 ALKPHOS 57 60 54  BILITOT 0.7 1.2 1.0  PROT 5.9* 5.9* 6.1*  ALBUMIN 2.4* 2.4* 2.5*   No results for input(s): LIPASE, AMYLASE in the last 168 hours. No results for input(s): AMMONIA in the last 168 hours.  CBC: Recent Labs  Lab 05/09/19 0820 05/11/19 0522 05/12/19 0607 05/13/19 0602 05/14/19 0715  WBC 13.0* 9.5 10.7* 11.9* 13.1*  HGB 12.3 12.6 12.3 13.3 12.2  HCT 39.3 40.5 39.0 43.5 39.3  MCV 87.7 87.1 87.2 87.3 87.1  PLT 141* 138* 166 210 243    Cardiac Enzymes: No results for input(s): CKTOTAL, CKMB, CKMBINDEX, TROPONINI in the last 168 hours.  BNP: BNP (last 3 results) Recent Labs    06/15/18 0744 05/02/19 2350 05/07/19 1112  BNP  49.0 441.1* 325.9*    ProBNP (last 3 results) No results for input(s): PROBNP in the last 8760 hours.    Other results:  Imaging: No results found.   Medications:     Scheduled Medications: . acetaZOLAMIDE  250 mg Oral BID  . amoxicillin  500 mg Oral Q8H  . Chlorhexidine Gluconate Cloth  6 each Topical Daily  . ferrous sulfate  325 mg Oral Q breakfast  . gabapentin  600 mg Oral BID  . insulin aspart  0-20 Units Subcutaneous TID WC  . mouth rinse  15 mL Mouth Rinse BID  . pantoprazole  40 mg Oral QHS  . potassium chloride  40 mEq Oral BID  . pravastatin  20 mg Oral Daily  . sildenafil  20 mg Oral TID  . sodium chloride flush  3 mL Intravenous Q12H  . spironolactone  12.5 mg Oral Daily  . vitamin B-12  500 mcg Oral Daily    Infusions: . sodium chloride Stopped (05/10/19 1103)  . sodium chloride    . sodium chloride 10 mL/hr at 05/14/19 0611  . furosemide 160 mg (05/14/19 0927)  .  treprostinil (REMODULIN) infusion 81 ng/kg/min (05/12/19 1201)    PRN Medications: sodium chloride, docusate sodium, lidocaine (PF), ondansetron (ZOFRAN) IV, polyethylene glycol, sodium chloride flush   Assessment/Plan:   1. Shock, septic - Likely source lower extremity cellulitis.She also had hickman catheter in R upper chest (has been removed).  - Initial Blood Cultures + for Strep Group G.  - F/u cultures negative - new Rt IJ hickman cath placed 5/5 - on abx. ID narrowed abx from ceftriaxone to amoxicillin. AF. WBC up to 13 - Off pressors - ID planning 14 days abx. They do not feel TEE necessary - respiratory status unlikely to tolerate  - No change  2. LE cellulitis - Much improved. Continue abx - Leg is wrapped  3. A/C Hypoxic Respiratory Failure - Multifactorial. Improving - Hires CT 5/6 as above. + consolidation/atx and edema. Needs BIPAP and HFNC  - lasix increased to 160 IV bid and added diamox. Excellent response. Now about 5 pounds from baseline weight (260).   4. NSTEMI  -Trend 188>4166> 2856. Suspect demand ischemia - 2015 had myoview In EF 78% and no ischemia  - No chest pain. EF stable on echo - Ct chest with 2v CAD in LAD and RCA - Plan for cath today. No chest pain.  -  Will hold Xarelto today - off ASA with Xarelto - Continue statid - No b-blocker with severe lung disease  5. Severe PAH with Cor Pulmonale: - On IV remodulin with [facility]. Need to continue - Sildenafil resumed 5/4 - New Hickman cath placed this admit - RHC today   6. PAF - Maintaining NSR.  - Hold xarelto for cath.   7. Acute on chronic diastolic HF - Continue IV lasix + diamox.  - Can adjust diuretics after cath.   8. PSVT; - keep K> 4.0 Mg > 2.0  - avoiding b-blocker with severe lung disease.  Length of Stay: [employer] NP-C  05/14/2019, 10:21 AM  Advanced Heart Failure Team Pager [phone] (M-F; 7a - 4p)  Please contact [facility] for night-coverage after  hours (4p -7a ) and weekends on [email or web address]  Patient seen and examined with the above-signed Advanced Practice Provider and/or Housestaff. I personally reviewed laboratory data, imaging studies and relevant notes. I independently examined the patient and formulated the important aspects of the plan. I  have edited the note to reflect any of my changes or salient points. I have personally discussed the plan with the patient and/or family.  Feeling better with diuresis. Weight stable overnight. No CP. Renal function stable. Remains on IV remodulin.   General:  On HFNC No resp difficulty HEENT: normal Neck: supple. no obvious JVD.  + Hickman Carotids 2+ bilat; no bruits. No lymphadenopathy or thryomegaly appreciated. Cor: PMI nondisplaced. Regular rate & rhythm. 2/6 TR Lungs: coarse Abdomen: obese soft, nontender, nondistended. No hepatosplenomegaly. No bruits or masses. Good bowel sounds. Extremities: no cyanosis, clubbing, rash,  RLE wrapped + areas of skin sloughing. Erythema improved. Tr edema LLE Neuro: alert & orientedx3, cranial nerves grossly intact. moves all 4 extremities w/o difficulty. Affect pleasant  Improving slowly. Will plan R/L cath today to assess coronaries and reassess PAH and filling pressures. Supp K.   Glori Bickers, MD  12:12 PM

## 2019-05-14 NOTE — Progress Notes (Signed)
Patient on Bipap overnight.

## 2019-05-15 LAB — CBC
HCT: 39.7 % (ref 36.0–46.0)
Hemoglobin: 12.4 g/dL (ref 12.0–15.0)
MCH: 27.3 pg (ref 26.0–34.0)
MCHC: 31.2 g/dL (ref 30.0–36.0)
MCV: 87.4 fL (ref 80.0–100.0)
Platelets: 280 10*3/uL (ref 150–400)
RBC: 4.54 MIL/uL (ref 3.87–5.11)
RDW: 17.4 % — ABNORMAL HIGH (ref 11.5–15.5)
WBC: 10.6 10*3/uL — ABNORMAL HIGH (ref 4.0–10.5)
nRBC: 0.2 % (ref 0.0–0.2)

## 2019-05-15 LAB — BASIC METABOLIC PANEL
Anion gap: 8 (ref 5–15)
BUN: 17 mg/dL (ref 6–20)
CO2: 31 mmol/L (ref 22–32)
Calcium: 8.3 mg/dL — ABNORMAL LOW (ref 8.9–10.3)
Chloride: 103 mmol/L (ref 98–111)
Creatinine, Ser: 0.66 mg/dL (ref 0.44–1.00)
GFR calc Af Amer: >60 mL/min (ref >60)
GFR calc non Af Amer: >60 mL/min (ref >60)
Glucose, Bld: 122 mg/dL — ABNORMAL HIGH (ref 70–99)
Potassium: 3.4 mmol/L — ABNORMAL LOW (ref 3.5–5.1)
Sodium: 142 mmol/L (ref 135–145)

## 2019-05-15 LAB — GLUCOSE, CAPILLARY
Glucose-Capillary: 99 mg/dL (ref 70–99)
Glucose-Capillary: 131 mg/dL — ABNORMAL HIGH (ref 70–99)
Glucose-Capillary: 140 mg/dL — ABNORMAL HIGH (ref 70–99)
Glucose-Capillary: 155 mg/dL — ABNORMAL HIGH (ref 70–99)
Glucose-Capillary: 134 mg/dL — ABNORMAL HIGH (ref 70–99)
Glucose-Capillary: 174 mg/dL — ABNORMAL HIGH (ref 70–99)

## 2019-05-15 MED ORDER — POTASSIUM CHLORIDE CRYS ER 20 MEQ PO TBCR: 40.0000 meq | EXTENDED_RELEASE_TABLET | Freq: Once | ORAL | Status: DC

## 2019-05-15 NOTE — Progress Notes (Signed)
BIpap on standby.  Patient alert and oriented.  HFNC in use.

## 2019-05-15 NOTE — Progress Notes (Signed)
Advanced Heart Failure Rounding Note   Subjective:     CT chest 5/6 with severe LL consolidation/atx + edema  Cath yesterday with normal coronaries and much improved hemodynamics. IV lasix stopped. Was on Bipap overnight now back on HF  Feels better. SOB with exertion but otherwise improved. Planning SNF    Objective:   Weight Range:  Vital Signs:   Temp:  [98.5 F (36.9 C)-99.6 F (37.6 C)] 98.6 F (37 C) (05/11 0727) Pulse Rate:  [81-295] 91 (05/11 0727) Resp:  [0-31] 17 (05/11 0727) BP: (96-135)/(45-72) 114/67 (05/11 0727) SpO2:  [80 %-98 %] 92 % (05/11 0727) Weight:  [125 kg] 125 kg (05/11 0440) Last BM Date: 05/14/19  Weight change: Filed Weights   05/13/19 0700 05/14/19 0600 05/15/19 0440  Weight: 120.6 kg 121.4 kg 125 kg    Intake/Output:   Intake/Output Summary (Last 24 hours) at 05/15/2019 0813 Last data filed at 05/15/2019 0500 Gross per 24 hour  Intake 859.05 ml  Output 2350 ml  Net -1490.95 ml     Physical Exam: General:  Sitting up on side of bed on HFNC HEENT: normal Neck: supple. no JVD. Carotids 2+ bilat; no bruits. No lymphadenopathy or thryomegaly appreciated. Cor: PMI nondisplaced. Regular rate & rhythm. No rubs, gallops or murmurs. Lungs: coarse Abdomen: obese soft, nontender, nondistended. No hepatosplenomegaly. No bruits or masses. Good bowel sounds. Extremities: no cyanosis, clubbing, rash, RLE with mild edema and healing skin sloughing Neuro: alert & orientedx3, cranial nerves grossly intact. moves all 4 extremities w/o difficulty. Affect pleasant  Telemetry: NSR 80-90s personally reviewed.   Labs: Basic Metabolic Panel: Recent Labs  Lab 05/10/19 1535 05/10/19 1535 05/11/19 0522 05/11/19 0522 05/12/19 0154 05/12/19 0154 05/13/19 0752 05/14/19 0715 05/14/19 1340 05/14/19 1429 05/15/19 0525  NA 149*   < > 148*   < > 144   < > 143 138 159* 141  140 142  K 3.2*   < > 3.6   < > 3.3*   < > 3.3* 3.3* <2.0* 3.4*  3.3* 3.4*    CL 107   < > 108  --  101  --  102 101  --   --  103  CO2 32   < > 32  --  32  --  32 30  --   --  31  GLUCOSE 153*   < > 105*  --  130*  --  115* 108*  --   --  122*  BUN 15   < > 10  --  8  --  10 15  --   --  17  CREATININE 0.67   < > 0.69  --  0.72  --  0.86 0.96  --   --  0.66  CALCIUM 8.2*   < > 8.1*   < > 8.0*   < > 8.5* 8.0*  --   --  8.3*  MG 2.0  --   --   --  1.8  --   --   --   --   --   --    < > = values in this interval not displayed.    Liver Function Tests: Recent Labs  Lab 05/09/19 0820 05/11/19 0522 05/12/19 0154  AST _0 ALT _1 ALKPHOS 57 60 54  BILITOT 0.7 1.2 1.0  PROT 5.9* 5.9* 6.1*  ALBUMIN 2.4* 2.4* 2.5*   No results for input(s): LIPASE, AMYLASE in the  last 168 hours. No results for input(s): AMMONIA in the last 168 hours.  CBC: Recent Labs  Lab 05/11/19 0522 05/11/19 0522 05/12/19 9628 05/12/19 3662 05/13/19 0602 05/14/19 0715 05/14/19 1340 05/14/19 1429 05/15/19 0525  WBC 9.5  --  10.7*  --  11.9* 13.1*  --   --  PENDING  HGB 12.6   < > 12.3   < > 13.3 12.2 8.5* 14.6  14.6 12.4  HCT 40.5   < > 39.0   < > 43.5 39.3 25.0* 43.0  43.0 39.7  MCV 87.1  --  87.2  --  87.3 87.1  --   --  87.4  PLT 138*  --  166  --  210 243  --   --  280   < > = values in this interval not displayed.    Cardiac Enzymes: No results for input(s): CKTOTAL, CKMB, CKMBINDEX, TROPONINI in the last 168 hours.  BNP: BNP (last 3 results) Recent Labs    06/15/18 0744 05/02/19 2350 05/07/19 1112  BNP 49.0 441.1* 325.9*    ProBNP (last 3 results) No results for input(s): PROBNP in the last 8760 hours.    Other results:  Imaging: CARDIAC CATHETERIZATION  Result Date: 05/14/2019  Mid LAD lesion is 30% stenosed.  Findings: Ao = 94/64 (78) LV = 86/11 RA =  6 RV = 69/4 PA = 63/17 (37) PCW = 7 Fick cardiac output/index = 6.0/2.8 PVR = 5.0 WU Ao sat = 96% PA sat = 66%, 68% Assessment: 1. 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 Normal LVEF 60-65%  3. Low volume status 4. Mild to moderate PAH (much improved with remodulin) Plan/Discussion: No evidence of significant CAD. Hemodynamically optimized on current regimen. Stop IV lasix. Glori Bickers, MD 2:50 PM     Medications:     Scheduled Medications: . acetaZOLAMIDE  250 mg Oral BID  . amoxicillin  500 mg Oral Q8H  . Chlorhexidine Gluconate Cloth  6 each Topical Daily  . ferrous sulfate  325 mg Oral Q breakfast  . gabapentin  600 mg Oral BID  . insulin aspart  0-20 Units Subcutaneous TID WC  . mouth rinse  15 mL Mouth Rinse BID  . pantoprazole  40 mg Oral QHS  . potassium chloride  40 mEq Oral BID  . potassium chloride  40 mEq Oral Once  . pravastatin  20 mg Oral Daily  . rivaroxaban  20 mg Oral Q supper  . sildenafil  20 mg Oral TID  . sodium chloride flush  3 mL Intravenous Q12H  . spironolactone  12.5 mg Oral Daily  . vitamin B-12  500 mcg Oral Daily    Infusions: . sodium chloride Stopped (05/10/19 1103)  . sodium chloride    . furosemide 160 mg (05/14/19 1819)  . treprostinil (REMODULIN) infusion 81 ng/kg/min (05/14/19 1154)    PRN Medications: sodium chloride, acetaminophen, docusate sodium, lidocaine (PF), ondansetron (ZOFRAN) IV, polyethylene glycol, sodium chloride flush   Assessment/Plan:   1. Shock, septic - Likely source lower extremity cellulitis.She also had hickman catheter in R upper chest (has been removed).  - Initial Blood Cultures + for Strep Group G.  - F/u cultures negative - new Rt IJ hickman cath placed 5/5 - now on oral abx per ID.  - much improved  2. LE cellulitis - resolving with therapy.   3. A/C Hypoxic Respiratory Failure - Multifactorial. Improving - Hires CT 5/6 as above. + consolidation/atx and edema. Needs BIPAP and HFNC  -  improved with IV diuresis. Hemodynamics optimized on cath  4. NSTEMI  -Trend 211>9417> 2856. Suspect demand ischemia - 2015 had myoview In EF 78% and no ischemia  - No chest pain. EF stable on  echo - Ct chest with 2v CAD in LAD and RCA - Cath with minimal CAD. I reviewed personally with her.   5. Severe PAH with Cor Pulmonale: - On IV remodulin with [facility]. Need to continue - Sildenafil resumed 5/4 - New Hickman cath placed this admit - RH pressures improved on Remodulin  6. PAF - Maintaining NSR.  - Now back on Xarelto post cath   7. Acute on chronic diastolic HF - Filling pressures optimized on cath.  - can resume oral diuretics tomorrow (home regimen)  8. PSVT; - keep K> 4.0 Mg > 2.0  - avoiding b-blocker with severe lung disease.  Ok for d/c to SNF from our standpoint.   Length of Stay: 13   Glori Bickers MD 05/15/2019, 8:13 AM  Advanced Heart Failure Team Pager [phone] (M-F; [place])  Please contact [facility] for night-coverage after hours (4p -7a ) and weekends on [email or web address]

## 2019-05-15 NOTE — Progress Notes (Signed)
NAME:  [name], MRN:  [id number], DOB:  [birth year], LOS: 28 ADMISSION DATE:  05/02/2019, CONSULTATION DATE:  4/28 REFERRING MD:  Long, CHIEF COMPLAINT:  Dyspnea   Brief History   59 y/o female with [place] on pulmonary vasodilators admitted on 4/28 with severe sepsis in the setting of pneumonia and cellulitis.  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 Peripheral neuropathy Urinary incontinence  Significant Hospital Events   4/28 > admit started on HFNC and transitioned to NIPPV 2/2 to desaturation   Consults:  Heart failure  Procedures:  4/28 Art line pending> 4/29: CVC >> pulled out 4/30 5/1: infected hickman removed by IR 5/5 Hickman placed by IR  Significant Diagnostic Tests:  4/29 TTE> LVEF 66-29%, RV systolic function moderately reduced, RV size moderately enlarged, valves OK; in general RV function down compared to prior 5/7 CT chest > small bilateral effusions, large heart, large pulmonary arteries, some interlobular septal thickening, mild ground glass and atelectasis in dependent areas, 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 COVID 4/28 > neg. Blood 4/28 >strep G RVP 4/28 >negative Blood 5/02>negative  Antimicrobials:  Vanc 4/28 >4/29 Ceftriaxone 4/28 >5/3 Azithromycin 4/28 > 5/1 Cefazolin 5/4 x1 Amoxicillin 5/5 >   Interim history/subjective:   Negative cardiac cath. Able to get up to chair with assistance.   Objective   Blood pressure 114/67, pulse 94, temperature 98.6 F (37 C), temperature source Oral, resp. rate 18, height _0  (1.6 m), weight 125 kg, last menstrual period 02/19/2011, SpO2 93 %.        Intake/Output Summary (Last 24 hours) at 05/15/2019 1049 Last data filed at 05/15/2019 0500 Gross per 24 hour  Intake 859.05 ml  Output 1700 ml  Net -840.95  ml   Filed Weights   05/13/19 0700 05/14/19 0600 05/15/19 0440  Weight: 120.6 kg 121.4 kg 125 kg    Examination: General: Morbidly obese kyphoscoliotic female no acute distress HEENT: No JVD is appreciated Neuro: Grossly intact CV: Heart sounds are regular PULM: Decreased breath sounds throughout  GI: soft, bsx4 active  Extremities: warm/dry, 1+ edema  Skin: no rashes or lesions  .   Resolved Hospital Problem list   Septic shock Acute toxic metabolic encephalopathy > resolved  Assessment & Plan:  Acute on chronic hypoxemic and hypercarbic respiratory failure in setting of decompensated cor pulmonale.  Has severe PAH baseline.  Restrictive lung disease with kyphoscoliosis  Continue remodulin Continue sildenafil Wean O2 as tolerated Diuresis as tolerated Pulmonary toilet PCCM see as needed  Continue Remodulin Continue sildenafil Wean off high flow oxygen for O2 saturation greater than 85% at rest Continue diuresis.  OSA Bronchomalacia BiPAP at night O2 as needed  Hypernatremia AKI resolved Recent Labs  Lab 05/14/19 1340 05/14/19 1429 05/15/19 0525  NA 159* 141  140 142   Lab Results  Component Value Date   CREATININE 0.66 05/15/2019   CREATININE 0.96 05/14/2019   CREATININE 0.86 05/13/2019   CREATININE 0.68 05/17/2013   CREATININE 0.87 09/29/2012   CREATININE 1.44 (H) 08/10/2012   Recent Labs  Lab 05/14/19 1340 05/14/19 1429 05/15/19 0525  K <2.0* 3.4*  3.3* 3.4*     Per cardiology Sodium is returned to normal parameters Replete potassium per primary  Strep G bacteremia, CAP, cellulitis right leg Amoxicillin for 14 days total  Paroxysmal Atrial fib Continue Xarelto Being  followed by cardiology Cardiac cath essentially negative  Nutritional needs Diet as tolerated  DM2  CBG (last 3)  Recent Labs    05/14/19 2317 05/15/19 0316 05/15/19 0725  GLUCAP 125* 99 131*    Sliding-scale insulin protocol   Best practice:  Diet:  advance Pain/Anxiety/Delirium protocol (if indicated): n/a VAP protocol (if indicated): n/a DVT prophylaxis: change to xarelto GI prophylaxis: n/a Glucose control: SSI Mobility: out of bed Code Status: limited code Family Communication: 05/15/2019 patient updated at bedside Disposition: move to progressive care, 2W only, TRH service to pick up on 5/7, PCCM evaluated 05/15/2019 much improved.  Decrease shortness of breath.  Plan is for skilled nursing facility near future  Labs   CBC: Recent Labs  Lab 05/11/19 0522 05/11/19 0522 05/12/19 9563 05/12/19 8756 05/13/19 0602 05/14/19 0715 05/14/19 1340 05/14/19 1429 05/15/19 0525  WBC 9.5  --  10.7*  --  11.9* 13.1*  --   --  10.6*  HGB 12.6   < > 12.3   < > 13.3 12.2 8.5* 14.6  14.6 12.4  HCT 40.5   < > 39.0   < > 43.5 39.3 25.0* 43.0  43.0 39.7  MCV 87.1  --  87.2  --  87.3 87.1  --   --  87.4  PLT 138*  --  166  --  210 243  --   --  280   < > = values in this interval not displayed.    Basic Metabolic Panel: Recent Labs  Lab 05/10/19 1535 05/10/19 1535 05/11/19 0522 05/11/19 0522 05/12/19 0154 05/12/19 0154 05/13/19 0752 05/14/19 0715 05/14/19 1340 05/14/19 1429 05/15/19 0525  NA 149*   < > 148*   < > 144   < > 143 138 159* 141  140 142  K 3.2*   < > 3.6   < > 3.3*   < > 3.3* 3.3* <2.0* 3.4*  3.3* 3.4*  CL 107   < > 108  --  101  --  102 101  --   --  103  CO2 32   < > 32  --  32  --  32 30  --   --  31  GLUCOSE 153*   < > 105*  --  130*  --  115* 108*  --   --  122*  BUN 15   < > 10  --  8  --  10 15  --   --  17  CREATININE 0.67   < > 0.69  --  0.72  --  0.86 0.96  --   --  0.66  CALCIUM 8.2*   < > 8.1*  --  8.0*  --  8.5* 8.0*  --   --  8.3*  MG 2.0  --   --   --  1.8  --   --   --   --   --   --    < > = values in this interval not displayed.   GFR: Estimated Creatinine Clearance: 99.7 mL/min (by C-G formula based on SCr of 0.66 mg/dL). Recent Labs  Lab 05/12/19 0607 05/13/19 0602 05/14/19 0715  05/15/19 0525  WBC 10.7* 11.9* 13.1* 10.6*    Liver Function Tests: Recent Labs  Lab 05/09/19 0820 05/11/19 0522 05/12/19 0154  AST _0 ALT _1 ALKPHOS 57 60 54  BILITOT 0.7 1.2 1.0  PROT 5.9* 5.9* 6.1*  ALBUMIN 2.4* 2.4* 2.5*  No results for input(s): LIPASE, AMYLASE in the last 168 hours. No results for input(s): AMMONIA in the last 168 hours.  ABG    Component Value Date/Time   PHART 7.358 05/14/2019 1340   PCO2ART 33.3 05/14/2019 1340   PO2ART 83 05/14/2019 1340   HCO3 33.7 (H) 05/14/2019 1429   HCO3 32.2 (H) 05/14/2019 1429   TCO2 36 (H) 05/14/2019 1429   TCO2 34 (H) 05/14/2019 1429   ACIDBASEDEF 6.0 (H) 05/14/2019 1340   O2SAT 66.0 05/14/2019 1429   O2SAT 68.0 05/14/2019 1429     Coagulation Profile: No results for input(s): INR, PROTIME in the last 168 hours.  Cardiac Enzymes: No results for input(s): CKTOTAL, CKMB, CKMBINDEX, TROPONINI in the last 168 hours.  HbA1C: Hemoglobin A1C  Date/Time Value Ref Range Status  04/09/2019 01:48 PM 5.5 4.0 - 5.6 % Final  07/11/2018 04:18 PM 6.1 (A) 4.0 - 5.6 % Final   Hgb A1c MFr Bld  Date/Time Value Ref Range Status  10/14/2010 10:41 AM 13.5 (H) <5.7 % Final    Comment:                                                                           According to the ADA Clinical Practice Recommendations for 2011, when HbA1c is used as a screening test:     >=6.5%   Diagnostic of Diabetes Mellitus            (if abnormal result is confirmed)   5.7-6.4%   Increased risk of developing Diabetes Mellitus   References:Diagnosis and Classification of Diabetes Mellitus,Diabetes [id number],[id number](RRNHA 1):S62-S69 and Standards of Medical Care in         Diabetes - 2011,Diabetes [id number],[id number] (Suppl 1):S11-S61.    CBG: Recent Labs  Lab 05/14/19 1503 05/14/19 1944 05/14/19 2317 05/15/19 0316 05/15/19 0725  GLUCAP 94 176* 125* 99 131*         Steve Jimmie Rueter ACNP Acute Care Nurse Practitioner [place] Please consult Amion 05/15/2019, 10:49 AM

## 2019-05-15 NOTE — Progress Notes (Signed)
Occupational Therapy Treatment Patient Details Name: [name] MRN: [id number] DOB: 1960-04-25 Today's Date: 05/15/2019    History of present illness 59 y/o female with h/o PAH on pulmonary vasodilators, peripheral neuropathy, rurinary incontinence admitted on 4/28 with severe sepsis in the setting of pneumonia and cellulitis.   OT comments  Patient continues to make steady progress towards goals in skilled OT session. Patient's session encompassed functional ambulation to increase overall activity tolerance. Pt with need for increased encouragement to participate in therapy to date, but agreeable to sit up for lunch. Pt completed basic ADLs EOB, and completed sit<>stand transfer to recliner, but declined further ambulation as she had been up with PT this morning. Discharge recommendation remains appropriate; will continue to follow acutely.    Follow Up Recommendations  SNF;Supervision/Assistance - 24 hour    Equipment Recommendations       Recommendations for Other Services      Precautions / Restrictions Precautions Precautions: Fall Restrictions Weight Bearing Restrictions: No       Mobility Bed Mobility Overal bed mobility: Needs Assistance Bed Mobility: Supine to Sit     Supine to sit: Min assist;HOB elevated     General bed mobility comments: Use of bed rails, able to complete by herself  Transfers Overall transfer level: Needs assistance   Transfers: Sit to/from Stand;Stand Pivot Transfers Sit to Stand: Min guard Stand pivot transfers: Min assist;Min guard       General transfer comment: Raised overall bed level, cues for hand placement, min guard for stand pivot    Balance Overall balance assessment: Needs assistance Sitting-balance support: No upper extremity supported;Feet supported Sitting balance-Leahy Scale: Fair     Standing balance support: Single extremity supported;Bilateral upper extremity supported Standing balance-Leahy Scale:  Poor Standing balance comment: reliant on AD for external support to stand pivot to chair                           ADL either performed or assessed with clinical judgement   ADL Overall ADL's : Needs assistance/impaired Eating/Feeding: Set up;Sitting   Grooming: Set up;Sitting;Wash/dry face;Wash/dry Nurse, mental health Details (indicate cue type and reason): EOB                 Toilet Transfer: Stand-pivot;Min Psychiatric nurse Details (indicate cue type and reason): Simulated with recliner         Functional mobility during ADLs: Min guard;Rolling walker General ADL Comments: Increased encouragement to participate but willing to sit up for lunch in chair     Vision       Perception     Praxis      Cognition Arousal/Alertness: Awake/alert Behavior During Therapy: WFL for tasks assessed/performed Overall Cognitive Status: History of cognitive impairments - at baseline                                          Exercises     Shoulder Instructions       General Comments      Pertinent Vitals/ Pain       Pain Assessment: No/denies pain  Home Living                                          Prior Functioning/Environment  Frequency  Min 2X/week        Progress Toward Goals  OT Goals(current goals can now be found in the care plan section)  Progress towards OT goals: Progressing toward goals  Acute Rehab OT Goals Patient Stated Goal: to get stronger OT Goal Formulation: With patient Potential to Achieve Goals: Good  Plan Discharge plan remains appropriate    Co-evaluation                 AM-PAC OT "6 Clicks" Daily Activity     Outcome Measure   Help from another person eating meals?: None Help from another person taking care of personal grooming?: A Little Help from another person toileting, which includes using toliet, bedpan, or urinal?: A Lot Help from another person  bathing (including washing, rinsing, drying)?: A Lot Help from another person to put on and taking off regular upper body clothing?: A Little   6 Click Score: 14    End of Session Equipment Utilized During Treatment: Oxygen  OT Visit Diagnosis: Other abnormalities of gait and mobility (R26.89);Unsteadiness on feet (R26.81);Pain;Muscle weakness (generalized) (M62.81)   Activity Tolerance Patient tolerated treatment well   Patient Left in chair;with call bell/phone within reach   Nurse Communication Mobility status        Time: 2836-6294 OT Time Calculation (min): 16 min  Charges: OT General Charges $OT Visit: 1 Visit OT Treatments $Self Care/Home Management : 8-22 mins  Corinne Ports E. Victoriana Aziz, COTA/L Acute Rehabilitation Services [place] 05/15/2019, 2:21 PM

## 2019-05-15 NOTE — Progress Notes (Signed)
BIpap in use overnight

## 2019-05-15 NOTE — Progress Notes (Signed)
Physical Therapy Treatment Patient Details Name: [name] MRN: [id number] DOB: 20-Jun-1960 Today's Date: 05/15/2019    History of Present Illness 59 y/o female with h/o PAH on pulmonary vasodilators, peripheral neuropathy, rurinary incontinence admitted on 4/28 with severe sepsis in the setting of pneumonia and cellulitis.    PT Comments    Pt agreeable to getting OOB and getting in the hall, but needed to go to the bathroom first.  Emphasis on transition to EOB, transfer to [facility], standing for peri-care, ambulation with RW.  Sats on 10 L HFNC ranged fro 87% to 92% with 88% on average.    Follow Up Recommendations  SNF;Supervision/Assistance - 24 hour     Equipment Recommendations  None recommended by PT;Other (comment)(TBA next venue)    Recommendations for Other Services       Precautions / Restrictions Precautions Precautions: Fall Restrictions Weight Bearing Restrictions: No    Mobility  Bed Mobility Overal bed mobility: Needs Assistance Bed Mobility: Supine to Sit     Supine to sit: Min assist     General bed mobility comments: Use of bed rails, able to complete by herself slowly  Transfers Overall transfer level: Needs assistance   Transfers: Sit to/from Stand;Stand Pivot Transfers Sit to Stand: Min assist;Min guard(min from bed, min guard from [facility] after warm up) Stand pivot transfers: Min assist       General transfer comment: cues for hand placement, assist to come forward from lower surface, no assist from [facility]  Ambulation/Gait Ambulation/Gait assistance: Min guard Gait Distance (Feet): 110 Feet Assistive device: Rolling walker (2 wheeled) Gait Pattern/deviations: Step-through pattern Gait velocity: slower Gait velocity interpretation: 1.31 - 2.62 ft/sec, indicative of limited community ambulator General Gait Details: generally steady, but too far from the RW,  maintain about 88% on 10L  with no better than 92% during activity and a low of 87%.   HR rising into the 90's   Stairs             Wheelchair Mobility    Modified Rankin (Stroke Patients Only)       Balance Overall balance assessment: Needs assistance Sitting-balance support: No upper extremity supported Sitting balance-Leahy Scale: Fair     Standing balance support: Single extremity supported;Bilateral upper extremity supported Standing balance-Leahy Scale: Poor Standing balance comment: reliant on AD for external support to stand pivot to chair                            Cognition Arousal/Alertness: Awake/alert Behavior During Therapy: WFL for tasks assessed/performed Overall Cognitive Status: History of cognitive impairments - at baseline                                        Exercises      General Comments        Pertinent Vitals/Pain Pain Assessment: No/denies pain    Home Living                      Prior Function            PT Goals (current goals can now be found in the care plan section) Acute Rehab PT Goals Patient Stated Goal: to get stronger PT Goal Formulation: With patient Time For Goal Achievement: 05/24/19 Potential to Achieve Goals: Good Progress towards PT goals: Progressing toward goals  Frequency    Min 2X/week      PT Plan Current plan remains appropriate    Co-evaluation              AM-PAC PT "6 Clicks" Mobility   Outcome Measure  Help needed turning from your back to your side while in a flat bed without using bedrails?: A Little Help needed moving from lying on your back to sitting on the side of a flat bed without using bedrails?: A Little Help needed moving to and from a bed to a chair (including a wheelchair)?: A Little Help needed standing up from a chair using your arms (e.g., wheelchair or bedside chair)?: A Little Help needed to walk in hospital room?: A Little Help needed climbing 3-5 steps with a railing? : A Lot 6 Click Score: 17    End of  Session   Activity Tolerance: Patient tolerated treatment well;Patient limited by fatigue Patient left: in chair;with call bell/phone within reach;with chair alarm set Nurse Communication: Mobility status PT Visit Diagnosis: Unsteadiness on feet (R26.81);Difficulty in walking, not elsewhere classified (R26.2)     Time: 2409-7353 PT Time Calculation (min) (ACUTE ONLY): 30 min  Charges:  $Gait Training: 8-22 mins $Therapeutic Activity: 8-22 mins                     05/15/2019  Ginger Carne., PT Acute Rehabilitation Services [phone]  (pager) [phone]  (office)   Tessie Fass Soren Lazarz 05/15/2019, 2:57 PM

## 2019-05-15 NOTE — Progress Notes (Addendum)
PROGRESS NOTE    [name]  YQI:[id number] DOB: [birth year] DOA: 05/02/2019 PCP: [name], MD   Brief Narrative: 59 year old with past medical history significant for severe PAH on Remodulin and sildenafil, OSA, morbid obesity, diabetes type 2, CKD who presents on 05/03/2019 with fever, productive cough, hypotensive tachycardic and worsening hypoxemia.  Chest x-ray with enlarged central vessel, cardiomegaly and suggestion of left lower lobe pneumonia.  She was admitted with acute on chronic hypoxic respiratory failure, severe sepsis secondary to pneumonia, found to have strep G bacteremia, severe pulmonary arterial hypertension.  She was treated with IV antibiotic and subsequently transitioned to amoxicillin.  She needs 14 days of amoxicillin per infectious disease recommendation.  She has been followed by the heart failure team, she was treated with IV Laxis. Plan to transition to home diuretic on 5/12. Tapering oxygen to 8 L today.   Had Heart cath with normal coronaries and improved hemodynamics.   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  Currently on high flow oxygen. Continue with IV Laxis, Diamox Continue with Remodulin and revatio.  Continue with BiPAP at night. Treated with  with amoxicillin. Oxygen down to 8 L.   Acute on chronic diastolic heart failure with cor pulmonale, severe underlying PAH; Treated  with IV  lasix , demadox.  Appreciate Dr Phillip Heal help.  Weight; 286---265---267--275,  Cath 5/12: Normal coronaries and improved hemodynamics.  HF managing diuretics. IV lasix discontinue. Plan to start home regimen tomorrow.    Right lower extremity cellulitis Vancomycin and cefazolin initially.  Subsequently transition to amoxicillin 5/05. Appreciate Dr Megan Salon, he recommend 3 more days of amoxicillin for cellulitis. , changes of skin is normal evolution of cellulitis. Last dose 5/11 Wound care consulted. Monitor WBC.   Septic shock, Sepsis secondary to pneumonia and lower extremity cellulitis,  Strep G bacteremia: She was initially treated with IV antibiotics, require IV pressors, Neo.  Currently off of IV pressors. Hickman catheter was exchanged. Completed 2 weeks antibiotics.   STEMI ;evaded troponin: Suspect demand ischemia. Cardiology following  Hypokalemia: Replete orally Paroxysmal A. fib: On Xarelto.   Non Sustained V. tach: Replace potassium and magnesium.   Estimated body mass index is 48.82 kg/m as calculated from the following:   Height as of this encounter: _0  (1.6 m).   Weight as of this encounter: 125 kg.   DVT prophylaxis: Xarelto Code Status: partial, yes to everything except intubation.  Family Communication: care discussed with patient Disposition Plan:  Patient is from: Home Anticipated d/c date: 3-4 days when respiratory status improved.  Anticipated discharge to; probably SNF Barriers to d/c or necessity for inpatient status: tapering oxygen, today down to 8 L.   Consultants:   CCM  Cardiology  ID  Procedures:   None  Antimicrobials:  Amoxicillin 5/05  Subjective: Breathing better, happy with cath results.   Objective: Vitals:   05/15/19 0829 05/15/19 1220 05/15/19 1415 05/15/19 1425  BP:  111/66 123/76   Pulse: 94 83  84  Resp: 18 (!) 23  (!) 28  Temp:  98.6 F (37 C)    TempSrc:  Oral    SpO2: 93% 90%  96%  Weight:        Height:        Intake/Output Summary (Last 24 hours) at 05/15/2019 1515 Last data filed at 05/15/2019 0500 Gross per 24 hour  Intake 859.05 ml  Output 1700 ml  Net -840.95 ml   Filed Weights   05/13/19 0700 05/14/19 0600 05/15/19 0440  Weight: 120.6 kg 121.4 kg 125 kg    Examination:  General exam: Obese Respiratory system: Normal respiratory effort, no wheezing, mild crackles. .  Cardiovascular system: S 1, S 2 RRR Gastrointestinal system: BS present, soft, nt Central nervous system: Alert Extremities: Right lower extremity with redness, edema, ecchymosis    Data Reviewed: I have personally reviewed following labs and imaging studies  CBC: Recent Labs  Lab 05/11/19 0522 05/11/19 0522 05/12/19 8270 05/12/19 7867 05/13/19 0602 05/14/19 0715 05/14/19 1340 05/14/19 1429 05/15/19 0525  WBC 9.5  --  10.7*  --  11.9* 13.1*  --   --  10.6*  HGB 12.6   < > 12.3   < > 13.3 12.2 8.5* 14.6  14.6 12.4  HCT 40.5   < > 39.0   < > 43.5 39.3 25.0* 43.0  43.0 39.7  MCV 87.1  --  87.2  --  87.3 87.1  --   --  87.4  PLT 138*  --  166  --  210 243  --   --  280   < > = values in this interval not displayed.   Basic Metabolic Panel: Recent Labs  Lab 05/10/19 1535 05/10/19 1535 05/11/19 0522 05/11/19 0522 05/12/19 0154 05/12/19 0154 05/13/19 0752 05/14/19 0715 05/14/19 1340 05/14/19 1429 05/15/19 0525  NA 149*   < > 148*   < > 144   < > 143 138 159* 141  140 142  K 3.2*   < > 3.6   < > 3.3*   < > 3.3* 3.3* <2.0* 3.4*  3.3* 3.4*  CL 107   < > 108  --  101  --  102 101  --   --  103  CO2 32   < > 32  --  32  --  32 30  --   --  31  GLUCOSE 153*   < > 105*  --  130*  --  115* 108*  --   --  122*  BUN 15   < > 10  --  8  --  10 15  --   --  17  CREATININE 0.67   < > 0.69  --  0.72  --  0.86 0.96  --   --  0.66  CALCIUM 8.2*   < > 8.1*  --  8.0*  --  8.5* 8.0*  --   --  8.3*  MG 2.0  --   --   --  1.8  --   --   --   --   --   --    < > = values in this interval not  displayed.   GFR: Estimated Creatinine Clearance: 99.7 mL/min (by C-G formula based on SCr of 0.66 mg/dL). Liver Function Tests: Recent Labs  Lab 05/09/19 0820 05/11/19 0522 05/12/19 0154  AST _0 ALT 13 13 14  ALKPHOS 57 60 54  BILITOT 0.7 1.2 1.0  PROT 5.9* 5.9* 6.1*  ALBUMIN 2.4* 2.4* 2.5*   No results for input(s): LIPASE, AMYLASE in the last 168 hours. No results for input(s): AMMONIA in the last 168 hours. Coagulation Profile: No results for input(s): INR, PROTIME in the last 168 hours. Cardiac Enzymes: No results for input(s): CKTOTAL, CKMB, CKMBINDEX, TROPONINI in the last 168 hours. BNP (last 3 results) No results for input(s): PROBNP in the last 8760 hours. HbA1C: No results for input(s): HGBA1C in the last 72 hours. CBG: Recent Labs  Lab 05/14/19 1944 05/14/19 2317 05/15/19 0316 05/15/19 0725 05/15/19 1218  GLUCAP 176* 125* 99 131* 140*   Lipid Profile: No results for input(s): CHOL, HDL, LDLCALC, TRIG, CHOLHDL, LDLDIRECT in the last 72 hours. Thyroid Function Tests: No results for input(s): TSH, T4TOTAL, FREET4, T3FREE, THYROIDAB in the last 72 hours. Anemia Panel: No results for input(s): VITAMINB12, FOLATE, FERRITIN, TIBC, IRON, RETICCTPCT in the last 72 hours. Sepsis Labs: No results for input(s): PROCALCITON, LATICACIDVEN in the last 168 hours.  Recent Results (from the past 240 hour(s))  Culture, blood (routine x 2)     Status: None   Collection Time: 05/06/19 11:50 AM   Specimen: BLOOD  Result Value Ref Range Status   Specimen Description BLOOD RIGHT ANTECUBITAL  Final   Special Requests   Final    BOTTLES DRAWN AEROBIC ONLY Blood Culture results may not be optimal due to an inadequate volume of blood received in culture bottles   Culture   Final    NO GROWTH 5 DAYS Performed at [facility], [place] [address] [place], [place] [address]    Report Status 05/11/2019 FINAL  Final  Culture, blood (routine x 2)     Status: None    Collection Time: 05/06/19 11:57 AM   Specimen: BLOOD RIGHT HAND  Result Value Ref Range Status   Specimen Description BLOOD RIGHT HAND  Final   Special Requests   Final    BOTTLES DRAWN AEROBIC AND ANAEROBIC Blood Culture adequate volume   Culture   Final    NO GROWTH 5 DAYS Performed at [facility], [place] [address], [place], [place] [address]    Report Status 05/11/2019 FINAL  Final         Radiology Studies: CARDIAC CATHETERIZATION  Result Date: 05/14/2019  Mid LAD lesion is 30% stenosed.  Findings: Ao = 94/64 (78) LV = 86/11 RA =  6 RV = 69/4 PA = 63/17 (37) PCW = 7 Fick cardiac output/index = 6.0/2.8 PVR = 5.0 WU Ao sat = 96% PA sat = 66%, 68% Assessment: 1. 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 Normal LVEF 60-65% 3. Low volume status 4. Mild to moderate PAH (much improved with remodulin) Plan/Discussion: No evidence of significant CAD. Hemodynamically optimized on current regimen. Stop IV lasix. Glori Bickers, MD 2:50 PM        Scheduled Meds: . acetaZOLAMIDE  250 mg Oral BID  . Chlorhexidine Gluconate Cloth  6 each Topical Daily  . ferrous sulfate  325 mg Oral Q breakfast  . gabapentin  600 mg Oral BID  . insulin aspart  0-20 Units Subcutaneous TID WC  . mouth rinse  15 mL Mouth Rinse BID  . pantoprazole  40 mg Oral QHS  . potassium chloride  40 mEq Oral BID  . pravastatin  20 mg Oral Daily  . rivaroxaban  20 mg Oral Q supper  . sildenafil  20 mg Oral TID  . sodium chloride flush  3 mL Intravenous Q12H  . spironolactone  12.5 mg Oral Daily  . vitamin B-12  500 mcg Oral Daily   Continuous Infusions: . sodium chloride Stopped (05/10/19 1103)  . sodium chloride    . treprostinil (REMODULIN) infusion 81 ng/kg/min (05/14/19 1154)     LOS: 13 days    Time spent: 35 minutes    Brandis Matsuura A Sameena Artus, MD [facility]   If 7PM-7AM, please contact night-coverage [email or web address]  05/15/2019, 3:15 PM

## 2019-05-16 DIAGNOSIS — I878 Other specified disorders of veins: Secondary | ICD-10-CM

## 2019-05-16 DIAGNOSIS — J9621 Acute and chronic respiratory failure with hypoxia: Secondary | ICD-10-CM

## 2019-05-16 DIAGNOSIS — E785 Hyperlipidemia, unspecified: Secondary | ICD-10-CM

## 2019-05-16 LAB — CBC
HCT: 39.3 % (ref 36.0–46.0)
Hemoglobin: 12.4 g/dL (ref 12.0–15.0)
MCH: 27.6 pg (ref 26.0–34.0)
MCHC: 31.6 g/dL (ref 30.0–36.0)
MCV: 87.3 fL (ref 80.0–100.0)
Platelets: 314 10*3/uL (ref 150–400)
RBC: 4.5 MIL/uL (ref 3.87–5.11)
RDW: 17.5 % — ABNORMAL HIGH (ref 11.5–15.5)
WBC: 10.4 10*3/uL (ref 4.0–10.5)
nRBC: 0 % (ref 0.0–0.2)

## 2019-05-16 LAB — GLUCOSE, CAPILLARY
Glucose-Capillary: 101 mg/dL — ABNORMAL HIGH (ref 70–99)
Glucose-Capillary: 107 mg/dL — ABNORMAL HIGH (ref 70–99)
Glucose-Capillary: 112 mg/dL — ABNORMAL HIGH (ref 70–99)
Glucose-Capillary: 127 mg/dL — ABNORMAL HIGH (ref 70–99)
Glucose-Capillary: 139 mg/dL — ABNORMAL HIGH (ref 70–99)
Glucose-Capillary: 190 mg/dL — ABNORMAL HIGH (ref 70–99)

## 2019-05-16 LAB — BASIC METABOLIC PANEL
Anion gap: 9 (ref 5–15)
BUN: 17 mg/dL (ref 6–20)
CO2: 25 mmol/L (ref 22–32)
Calcium: 8.5 mg/dL — ABNORMAL LOW (ref 8.9–10.3)
Chloride: 102 mmol/L (ref 98–111)
Creatinine, Ser: 0.72 mg/dL (ref 0.44–1.00)
GFR calc Af Amer: >60 mL/min (ref >60)
GFR calc non Af Amer: >60 mL/min (ref >60)
Glucose, Bld: 113 mg/dL — ABNORMAL HIGH (ref 70–99)
Potassium: 3.2 mmol/L — ABNORMAL LOW (ref 3.5–5.1)
Sodium: 136 mmol/L (ref 135–145)

## 2019-05-16 MED ORDER — TORSEMIDE 20 MG PO TABS
80.0000 mg | ORAL_TABLET | Freq: Two times a day (BID) | ORAL | Status: DC
  Administered 2019-05-16 – 2019-05-24 (×17): 80 mg via ORAL
  Filled 2019-05-16 (×17): qty 4

## 2019-05-16 NOTE — TOC Progression Note (Addendum)
Transition of Care [facility]) - Progression Note    Patient Details  Name: [name] MRN: [id number] Date of Birth: 03-05-1960  Transition of Care [facility]) CM/SW [place], [place] Phone Number: 05/16/2019, 3:10 PM  Clinical Narrative:     Pt okay with committing 30 days to SNF and giving SSDI check.   CSW called Chantell from [place] who will review and return call to [place].   Called Freda Munro from [place] who will review and return call to [place].   Expanded bed search to [facility] and [place].   Expected Discharge Plan: [place] Barriers to Discharge: Continued Medical Work up  Expected Discharge Plan and Services Expected Discharge Plan: [place] arrangements for the past 2 months: Single Family Home                                       Social Determinants of Health (SDOH) Interventions    Readmission Risk Interventions No flowsheet data found.

## 2019-05-16 NOTE — Progress Notes (Signed)
NAME:  [name], MRN:  [id number], DOB:  06-28-1960, LOS: 1 ADMISSION DATE:  05/02/2019, CONSULTATION DATE:  4/28 REFERRING MD:  Long, CHIEF COMPLAINT:  Dyspnea   Brief History   59 y/o female with [place] on pulmonary vasodilators admitted on 4/28 with severe sepsis in the setting of pneumonia and cellulitis.  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 Peripheral neuropathy Urinary incontinence  Significant Hospital Events   4/28 > admit started on HFNC and transitioned to NIPPV 2/2 to desaturation   Consults:  Heart failure  Procedures:  4/28 Art line pending> 4/29: CVC >> pulled out 4/30 5/1: infected hickman removed by IR 5/5 Hickman placed by IR  Significant Diagnostic Tests:  4/29 TTE> LVEF 22-63%, RV systolic function moderately reduced, RV size moderately enlarged, valves OK; in general RV function down compared to prior 5/7 CT chest > small bilateral effusions, large heart, large pulmonary arteries, some interlobular septal thickening, mild ground glass and atelectasis in dependent areas, 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 COVID 4/28 > neg. Blood 4/28 >strep G RVP 4/28 >negative Blood 5/02>negative  Antimicrobials:  Vanc 4/28 >4/29 Ceftriaxone 4/28 >5/3 Azithromycin 4/28 > 5/1 Cefazolin 5/4 x1 Amoxicillin 5/5 >   Interim history/subjective:  No significant change   Objective   Blood pressure 129/67, pulse 76, temperature 98.1 F (36.7 C), temperature source Oral, resp. rate (!) 28, height _0  (1.6 m), weight 127.2 kg, last menstrual period 02/19/2011, SpO2 93 %.    FiO2 (%):  [60 %] 60 %   Intake/Output Summary (Last 24 hours) at 05/16/2019 1127 Last data filed at 05/15/2019 2200 Gross per 24 hour  Intake 240 ml  Output 450 ml  Net -210 ml   Filed Weights   05/14/19 0600 05/15/19 0440 05/16/19 0422  Weight: 121.4 kg 125 kg 127.2 kg    Examination: General: Morbid obese kyphotic female no acute distress at rest HEENT: Positive JVD Neuro: Grossly intact CV: Heart sounds are regular PULM: Diminished throughout FiO2 decreased to 8 L nasal cannula  GI: soft, bsx4 active  Extremities: warm/dry, 1+ edema  Skin: no rashes or lesions   .   Resolved Hospital Problem list   Septic shock Acute toxic metabolic encephalopathy > resolved  Assessment & Plan:  Acute on chronic hypoxemic and hypercarbic respiratory failure in setting of decompensated cor pulmonale.  Has severe PAH baseline.  Restrictive lung disease with kyphoscoliosis  Continue  remodulin Continuous sildenafil Wean FiO2 as tolerated Diuresis as tolerated Continue pulmonary toilet     OSA Bronchomalacia Wean FiO2 as tolerated Nocturnal noninvasive mechanical ventilatory support  Hypernatremia AKI resolved Recent Labs  Lab 05/14/19 1429 05/15/19 0525 05/16/19 0352  NA 141  140 142 136   Lab Results  Component Value Date   CREATININE 0.72 05/16/2019   CREATININE 0.66 05/15/2019   CREATININE 0.96 05/14/2019   CREATININE 0.68 05/17/2013   CREATININE 0.87 09/29/2012   CREATININE 1.44 (H) 08/10/2012   Recent Labs  Lab 05/14/19 1429 05/15/19 0525 05/16/19 0352  K 3.4*  3.3* 3.4* 3.2*     Per cardiology  Strep G bacteremia, CAP, cellulitis right leg Amoxicillin for total 14 days  Paroxysmal Atrial fib Per cardiology Nutritional needs Diet as tolerated  DM2  CBG (last 3)  Recent Labs    05/15/19 2225 05/16/19 0427 05/16/19 [place]  174* 107* 112*    Sliding insulin scale protocol   Best practice:  Diet: advance Pain/Anxiety/Delirium protocol (if indicated): n/a VAP protocol (if indicated): n/a DVT prophylaxis: change to xarelto GI prophylaxis: n/a Glucose control: SSI Mobility: out of bed Code Status: limited code Family  Communication: 05/16/2019 patient updated at bedside Disposition: move to progressive care, 2W only, TRH service to pick up on 5/7, PCCM evaluated 05/15/2019 much improved.  Decrease shortness of breath.  Plan is for skilled nursing facility near future.  05/16/2019 currently on 10 L nasal cannula decreased 8 no acute distress.  Labs   CBC: Recent Labs  Lab 05/12/19 0607 05/12/19 0607 05/13/19 0602 05/13/19 0602 05/14/19 0715 05/14/19 1340 05/14/19 1429 05/15/19 0525 05/16/19 0352  WBC 10.7*  --  11.9*  --  13.1*  --   --  10.6* 10.4  HGB 12.3   < > 13.3   < > 12.2 8.5* 14.6  14.6 12.4 12.4  HCT 39.0   < > 43.5   < > 39.3 25.0* 43.0  43.0 39.7 39.3  MCV 87.2  --  87.3  --  87.1  --   --  87.4 87.3  PLT 166  --  210  --  243  --   --  280 314   < > = values in this interval not displayed.    Basic Metabolic Panel: Recent Labs  Lab 05/10/19 1535 05/11/19 0522 05/12/19 0154 05/12/19 0154 05/13/19 0752 05/13/19 0752 05/14/19 0715 05/14/19 1340 05/14/19 1429 05/15/19 0525 05/16/19 0352  NA 149*   < > 144   < > 143   < > 138 159* 141  140 142 136  K 3.2*   < > 3.3*   < > 3.3*   < > 3.3* <2.0* 3.4*  3.3* 3.4* 3.2*  CL 107   < > 101  --  102  --  101  --   --  103 102  CO2 32   < > 32  --  32  --  30  --   --  31 25  GLUCOSE 153*   < > 130*  --  115*  --  108*  --   --  122* 113*  BUN 15   < > 8  --  10  --  15  --   --  17 17  CREATININE 0.67   < > 0.72  --  0.86  --  0.96  --   --  0.66 0.72  CALCIUM 8.2*   < > 8.0*  --  8.5*  --  8.0*  --   --  8.3* 8.5*  MG 2.0  --  1.8  --   --   --   --   --   --   --   --    < > = values in this interval not displayed.   GFR: Estimated Creatinine Clearance: 100.8 mL/min (by C-G formula based on SCr of 0.72 mg/dL). Recent Labs  Lab 05/13/19 0602 05/14/19 0715 05/15/19 0525 05/16/19 0352  WBC 11.9* 13.1* 10.6* 10.4    Liver Function Tests: Recent Labs  Lab 05/11/19 0522 05/12/19 0154  AST 26 25  ALT 13 14  ALKPHOS 60  54  BILITOT 1.2 1.0  PROT 5.9* 6.1*  ALBUMIN 2.4* 2.5*   No results for input(s): LIPASE, AMYLASE in the last 168 hours. No results for input(s): AMMONIA in the last 168 hours.  ABG  Component Value Date/Time   PHART 7.358 05/14/2019 1340   PCO2ART 33.3 05/14/2019 1340   PO2ART 83 05/14/2019 1340   HCO3 33.7 (H) 05/14/2019 1429   HCO3 32.2 (H) 05/14/2019 1429   TCO2 36 (H) 05/14/2019 1429   TCO2 34 (H) 05/14/2019 1429   ACIDBASEDEF 6.0 (H) 05/14/2019 1340   O2SAT 66.0 05/14/2019 1429   O2SAT 68.0 05/14/2019 1429     Coagulation Profile: No results for input(s): INR, PROTIME in the last 168 hours.  Cardiac Enzymes: No results for input(s): CKTOTAL, CKMB, CKMBINDEX, TROPONINI in the last 168 hours.  HbA1C: Hemoglobin A1C  Date/Time Value Ref Range Status  04/09/2019 01:48 PM 5.5 4.0 - 5.6 % Final  07/11/2018 04:18 PM 6.1 (A) 4.0 - 5.6 % Final   Hgb A1c MFr Bld  Date/Time Value Ref Range Status  10/14/2010 10:41 AM 13.5 (H) <5.7 % Final    Comment:                                                                           According to the ADA Clinical Practice Recommendations for 2011, when HbA1c is used as a screening test:     >=6.5%   Diagnostic of Diabetes Mellitus            (if abnormal result is confirmed)   5.7-6.4%   Increased risk of developing Diabetes Mellitus   References:Diagnosis and Classification of Diabetes Mellitus,Diabetes TDVV,6160,73(XTGGY 1):S62-S69 and Standards of Medical Care in         Diabetes - 2011,Diabetes IRSW,5462,70 (Suppl 1):S11-S61.    CBG: Recent Labs  Lab 05/15/19 1556 05/15/19 1923 05/15/19 2225 05/16/19 0427 05/16/19 0733  GLUCAP 155* 134* 174* 107* 112*         Steve Tison Leibold ACNP Acute Care Nurse Practitioner [place] Please consult Amion 05/16/2019, 11:27 AM

## 2019-05-16 NOTE — Progress Notes (Signed)
RT NOTES: Removed patient from bipap and placed on 10L HFNC

## 2019-05-16 NOTE — Progress Notes (Signed)
Patient on BIpap overnight.

## 2019-05-16 NOTE — Progress Notes (Signed)
Pt remodulin cartridge was change at 1030 at bedside with [facility] Rapid Response team and Legrand Como Pharmacist present. New machine was use along with new battery and confirmed continuous rate at  40 ml/24hrs. Next Cartridge change Friday 5/14.

## 2019-05-16 NOTE — Progress Notes (Signed)
Advanced Heart Failure Rounding Note   Subjective:     CT chest 5/6 with severe LL consolidation/atx + edema  Cath with normal coronaries and much improved hemodynamics. Breathing better.  Off diuretics.  Weight trending back up.    Objective:   Weight Range:  Vital Signs:   Temp:  [97.6 F (36.4 C)-98.6 F (37 C)] 97.6 F (36.4 C) (05/12 0709) Pulse Rate:  [78-99] 78 (05/12 0709) Resp:  [18-32] 26 (05/12 0709) BP: (110-123)/(60-76) 122/70 (05/12 0709) SpO2:  [90 %-96 %] 95 % (05/12 0737) FiO2 (%):  [60 %] 60 % (05/11 2000) Weight:  [127.2 kg] 127.2 kg (05/12 0422) Last BM Date: 05/14/19  Weight change: Filed Weights   05/14/19 0600 05/15/19 0440 05/16/19 0422  Weight: 121.4 kg 125 kg 127.2 kg    Intake/Output:   Intake/Output Summary (Last 24 hours) at 05/16/2019 0820 Last data filed at 05/15/2019 2200 Gross per 24 hour  Intake 240 ml  Output 450 ml  Net -210 ml     Physical Exam: General:  Sitting up in bed. No resp difficulty HEENT: normal Neck: supple. Hard to see JVP . Carotids 2+ bilat; no bruits. No lymphadenopathy or thryomegaly appreciated. Cor: PMI nondisplaced. Regular rate & rhythm. No rubs, gallops or murmurs. Lungs: clear  Abdomen: obese soft, nontender, nondistended. No hepatosplenomegaly. No bruits or masses. Good bowel sounds. Extremities: no cyanosis, clubbing, rash, trace edema healing skin sloughing on left Neuro: alert & orientedx3, cranial nerves grossly intact. moves all 4 extremities w/o difficulty. Affect pleasant  Telemetry: NSR 70-80s personally reviewed.   Labs: Basic Metabolic Panel: Recent Labs  Lab 05/10/19 1535 05/11/19 0522 05/12/19 0154 05/12/19 0154 05/13/19 6010 05/13/19 9323 05/14/19 0715 05/14/19 1340 05/14/19 1429 05/15/19 0525 05/16/19 0352  NA 149*   < > 144   < > 143   < > 138 159* 141  140 142 136  K 3.2*   < > 3.3*   < > 3.3*   < > 3.3* <2.0* 3.4*  3.3* 3.4* 3.2*  CL 107   < > 101  --  102  --   101  --   --  103 102  CO2 32   < > 32  --  32  --  30  --   --  31 25  GLUCOSE 153*   < > 130*  --  115*  --  108*  --   --  122* 113*  BUN 15   < > 8  --  10  --  15  --   --  17 17  CREATININE 0.67   < > 0.72  --  0.86  --  0.96  --   --  0.66 0.72  CALCIUM 8.2*   < > 8.0*   < > 8.5*   < > 8.0*  --   --  8.3* 8.5*  MG 2.0  --  1.8  --   --   --   --   --   --   --   --    < > = values in this interval not displayed.    Liver Function Tests: Recent Labs  Lab 05/11/19 0522 05/12/19 0154  AST 26 25  ALT 13 14  ALKPHOS 60 54  BILITOT 1.2 1.0  PROT 5.9* 6.1*  ALBUMIN 2.4* 2.5*   No results for input(s): LIPASE, AMYLASE in the last 168 hours. No results for input(s): AMMONIA in the last  168 hours.  CBC: Recent Labs  Lab 05/12/19 0607 05/12/19 0607 05/13/19 0602 05/13/19 0602 05/14/19 0715 05/14/19 1340 05/14/19 1429 05/15/19 0525 05/16/19 0352  WBC 10.7*  --  11.9*  --  13.1*  --   --  10.6* 10.4  HGB 12.3   < > 13.3   < > 12.2 8.5* 14.6  14.6 12.4 12.4  HCT 39.0   < > 43.5   < > 39.3 25.0* 43.0  43.0 39.7 39.3  MCV 87.2  --  87.3  --  87.1  --   --  87.4 87.3  PLT 166  --  210  --  243  --   --  280 314   < > = values in this interval not displayed.    Cardiac Enzymes: No results for input(s): CKTOTAL, CKMB, CKMBINDEX, TROPONINI in the last 168 hours.  BNP: BNP (last 3 results) Recent Labs    06/15/18 0744 05/02/19 2350 05/07/19 1112  BNP 49.0 441.1* 325.9*    ProBNP (last 3 results) No results for input(s): PROBNP in the last 8760 hours.    Other results:  Imaging: CARDIAC CATHETERIZATION  Result Date: 05/14/2019  Mid LAD lesion is 30% stenosed.  Findings: Ao = 94/64 (78) LV = 86/11 RA =  6 RV = 69/4 PA = 63/17 (37) PCW = 7 Fick cardiac output/index = 6.0/2.8 PVR = 5.0 WU Ao sat = 96% PA sat = 66%, 68% Assessment: 1. 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 Normal LVEF 60-65% 3. Low volume status 4. Mild to moderate PAH (much improved with  remodulin) Plan/Discussion: No evidence of significant CAD. Hemodynamically optimized on current regimen. Stop IV lasix. Glori Bickers, MD 2:50 PM     Medications:     Scheduled Medications: . acetaZOLAMIDE  250 mg Oral BID  . Chlorhexidine Gluconate Cloth  6 each Topical Daily  . ferrous sulfate  325 mg Oral Q breakfast  . gabapentin  600 mg Oral BID  . insulin aspart  0-20 Units Subcutaneous TID WC  . mouth rinse  15 mL Mouth Rinse BID  . pantoprazole  40 mg Oral QHS  . potassium chloride  40 mEq Oral BID  . pravastatin  20 mg Oral Daily  . rivaroxaban  20 mg Oral Q supper  . sildenafil  20 mg Oral TID  . sodium chloride flush  3 mL Intravenous Q12H  . spironolactone  12.5 mg Oral Daily  . vitamin B-12  500 mcg Oral Daily    Infusions: . sodium chloride Stopped (05/10/19 1103)  . sodium chloride    . treprostinil (REMODULIN) infusion 81 ng/kg/min (05/14/19 1154)    PRN Medications: sodium chloride, acetaminophen, docusate sodium, lidocaine (PF), ondansetron (ZOFRAN) IV, polyethylene glycol, sodium chloride flush   Assessment/Plan:   1. Shock, septic - Likely source lower extremity cellulitis.She also had hickman catheter in R upper chest (has been removed).  - Initial Blood Cultures + for Strep Group G.  - F/u cultures negative - new Rt IJ hickman cath placed 5/5 - now on oral abx per ID.  - resolved  2. LE cellulitis - resolving with therapy. Continues to improve  3. A/C Hypoxic Respiratory Failure - Multifactorial. Improving - Hires CT 5/6 as above. + consolidation/atx and edema. Needs BIPAP and HFNC  - improved with IV diuresis. Hemodynamics optimized on cath - restart diuretics today   4. NSTEMI  -Trend 062>3762> 2856. Suspect demand ischemia - 2015 had myoview In EF 78% and no  ischemia  - No chest pain. EF stable on echo - Ct chest with 2v CAD in LAD and RCA - Cath with minimal CAD. I reviewed personally with her.   5. Severe PAH with Cor  Pulmonale: - On IV remodulin with [facility]. Need to continue - Sildenafil resumed 5/4 - New Hickman cath placed this admit - RH pressures improved on Remodulin  6. PAF - Maintaining NSR.  - Now back on Xarelto post cath   7. Acute on chronic diastolic HF - Filling pressures optimized on cath.  - will resume torsemide today  8. PSVT; - keep K> 4.0 Mg > 2.0  - avoiding b-blocker with severe lung disease.  OK for d/c to SNF from our standpoint.  Length of Stay: 14   Glori Bickers MD 05/16/2019, 8:20 AM  Advanced Heart Failure Team Pager [phone] (M-F; 7a - 4p)  Please contact [facility] for night-coverage after hours (4p -7a ) and weekends on [email or web address]

## 2019-05-16 NOTE — Progress Notes (Signed)
PROGRESS NOTE    [name]  JJO:[id number] DOB: 10-09-1960 DOA: 05/02/2019 PCP: [name], MD   Brief Narrative: [name] is a 59 y.o. female with a history of severe PAH on Remodulin and sildenafil, OSA, morbid obesity, diabetes type 2, CKD. Patient presented secondary to fever, cough, hypotension and worsening hypoxia with evidence of sepsis from cellulitis and pneumonia in addition to heart failure.   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 Patient is on baseline 4 L oxygen as an outpatient. Currently still requiring HFNC -Wean to home 4 L as able  Septic shock Secondary to pneumonia/LE cellulitis and bacteremia. Patient required ICU admission and IV vasopressor support. Sepsis resolved. Patient completed antibiotic course.  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 Weight downtrended initially with diuresis. -Cardiology recommendations: Restart diuresis, continue Remodulin IV  Demand ischemia Initial concern for NSTEMI. Cardiology consulted performed left/right heart catheterization without evidence of significant obstructive disease.   Streptococcus bacteremia Completed antibiotic course.  Right lower extremity cellulitis Treated with vancomycin and cefazolin initially and transitioned to amoxacillin to complete antibiotic course.  Paroxysmal  atrial fibrillation -Continue Xarelto  Non-sustained V-tach Watch electrolytes. Continue telemetry  Obesity Body mass index is 49.68 kg/m.   DVT prophylaxis: Xarelto Code Status:   Code Status: Partial Code Family Communication: None at bedside Disposition Plan: Discharge to SNF when able to wean to baseline and pending SNF bed   Consultants:   Cardiology  PCCM  Infectious disease  Procedures:     Antimicrobials:      Subjective: No dyspnea today.  Objective: Vitals:   05/16/19 0422 05/16/19 0709 05/16/19 0737 05/16/19 0900  BP:  122/70  117/66  Pulse:  78  78  Resp:  (!) 26  18  Temp:  97.6 F (36.4 C)  97.8 F (36.6 C)  TempSrc:  Axillary    SpO2:  94% 95% 92%  Weight: 127.2 kg     Height:        Intake/Output Summary (Last 24 hours) at 05/16/2019 0909 Last data filed at 05/15/2019 2200 Gross per 24 hour  Intake 240 ml  Output 450 ml  Net -210 ml   Filed Weights   05/14/19 0600 05/15/19 0440 05/16/19 0422  Weight: 121.4 kg 125 kg 127.2 kg    Examination:  General exam: Appears calm and comfortable Respiratory system: Rales bilaterally. Respiratory effort normal. Cardiovascular system: S1 & S2 heard, RRR. 2/6 systolic murmur Gastrointestinal system: Abdomen is nondistended, soft and nontender. No organomegaly or masses felt. Normal bowel sounds heard. Central nervous system: Alert and oriented. No focal neurological deficits. Extremities: No edema. No calf tenderness Skin: Ecchymosis noted of right LE with macerated area and serous drainage. Edema bilaterally Psychiatry: Judgement and insight appear normal. Mood & affect appropriate.     Data Reviewed: I have personally reviewed following labs and imaging studies  CBC: Recent Labs  Lab 05/12/19 0607 05/12/19 0607 05/13/19 0602 05/13/19 0602 05/14/19 0715 05/14/19 1340 05/14/19 1429 05/15/19 0525 05/16/19 0352  WBC 10.7*  --  11.9*  --  13.1*  --   --  10.6* 10.4  HGB 12.3   <  > 13.3   < > 12.2 8.5* 14.6  14.6 12.4 12.4  HCT 39.0   < > 43.5   < > 39.3 25.0* 43.0  43.0 39.7 39.3  MCV 87.2  --  87.3  --  87.1  --   --  87.4 87.3  PLT 166  --  210  --  243  --   --  280 314   < > = values in this interval not displayed.   Basic Metabolic Panel: Recent Labs  Lab 05/10/19 1535 05/11/19 0522 05/12/19 0154 05/12/19 0154 05/13/19 0752 05/13/19 0752 05/14/19 0715 05/14/19 1340 05/14/19 1429 05/15/19 0525 05/16/19 0352  NA 149*   < > 144   < > 143   < > 138 159* 141  140 142 136  K 3.2*   < > 3.3*   < > 3.3*   < > 3.3* <2.0* 3.4*  3.3* 3.4* 3.2*  CL 107   < > 101  --  102  --  101  --   --  103 102  CO2 32   < > 32  --  32  --  30  --   --  31 25  GLUCOSE 153*   < > 130*  --  115*  --  108*  --   --  122* 113*  BUN 15   < > 8  --  10  --  15  --   --  17 17  CREATININE 0.67   < > 0.72  --  0.86  --  0.96  --   --  0.66 0.72  CALCIUM 8.2*   < > 8.0*  --  8.5*  --  8.0*  --   --  8.3* 8.5*  MG 2.0  --  1.8  --   --   --   --   --   --   --   --    < > = values in this interval not displayed.   GFR: Estimated Creatinine Clearance: 100.8 mL/min (by C-G formula based on SCr of 0.72 mg/dL). Liver Function Tests: Recent Labs  Lab 05/11/19 0522 05/12/19 0154  AST 26 25  ALT 13 14  ALKPHOS 60 54  BILITOT 1.2 1.0  PROT 5.9* 6.1*  ALBUMIN 2.4* 2.5*   No results for input(s): LIPASE, AMYLASE in the last 168 hours. No results for input(s): AMMONIA in the last 168 hours. Coagulation Profile: No results for input(s): INR, PROTIME in the last 168 hours. Cardiac Enzymes: No results for input(s): CKTOTAL, CKMB, CKMBINDEX, TROPONINI in the last 168 hours. BNP (last 3 results) No results for input(s): PROBNP in the last 8760 hours. HbA1C: No results for input(s): HGBA1C in the last 72 hours. CBG: Recent Labs  Lab 05/15/19 1556 05/15/19 1923 05/15/19 2225 05/16/19 0427 05/16/19 0733  GLUCAP 155* 134* 174* 107* 112*   Lipid Profile: No results for  input(s): CHOL, HDL, LDLCALC, TRIG, CHOLHDL, LDLDIRECT in the last 72 hours. Thyroid Function Tests: No results for input(s): TSH, T4TOTAL, FREET4, T3FREE, THYROIDAB in the last 72 hours. Anemia Panel: No results for input(s): VITAMINB12, FOLATE, FERRITIN, TIBC, IRON, RETICCTPCT in the last 72 hours. Sepsis Labs: No results for  input(s): PROCALCITON, LATICACIDVEN in the last 168 hours.  Recent Results (from the past 240 hour(s))  Culture, blood (routine x 2)     Status: None   Collection Time: 05/06/19 11:50 AM   Specimen: BLOOD  Result Value Ref Range Status   Specimen Description BLOOD RIGHT ANTECUBITAL  Final   Special Requests   Final    BOTTLES DRAWN AEROBIC ONLY Blood Culture results may not be optimal due to an inadequate volume of blood received in culture bottles   Culture   Final    NO GROWTH 5 DAYS Performed at [facility], [place] [address], [place], [place] [address]    Report Status 05/11/2019 FINAL  Final  Culture, blood (routine x 2)     Status: None   Collection Time: 05/06/19 11:57 AM   Specimen: BLOOD RIGHT HAND  Result Value Ref Range Status   Specimen Description BLOOD RIGHT HAND  Final   Special Requests   Final    BOTTLES DRAWN AEROBIC AND ANAEROBIC Blood Culture adequate volume   Culture   Final    NO GROWTH 5 DAYS Performed at [facility], [place] [address] [place], [place] [address]    Report Status 05/11/2019 FINAL  Final         Radiology Studies: CARDIAC CATHETERIZATION  Result Date: 05/14/2019  Mid LAD lesion is 30% stenosed.  Findings: Ao = 94/64 (78) LV = 86/11 RA =  6 RV = 69/4 PA = 63/17 (37) PCW = 7 Fick cardiac output/index = 6.0/2.8 PVR = 5.0 WU Ao sat = 96% PA sat = 66%, 68% Assessment: 1. 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 Normal LVEF 60-65% 3. Low volume status 4. Mild to moderate PAH (much improved with remodulin) Plan/Discussion: No evidence of significant CAD. Hemodynamically optimized on current regimen. Stop IV  lasix. Glori Bickers, MD 2:50 PM        Scheduled Meds: . acetaZOLAMIDE  250 mg Oral BID  . Chlorhexidine Gluconate Cloth  6 each Topical Daily  . ferrous sulfate  325 mg Oral Q breakfast  . gabapentin  600 mg Oral BID  . insulin aspart  0-20 Units Subcutaneous TID WC  . mouth rinse  15 mL Mouth Rinse BID  . pantoprazole  40 mg Oral QHS  . potassium chloride  40 mEq Oral BID  . pravastatin  20 mg Oral Daily  . rivaroxaban  20 mg Oral Q supper  . sildenafil  20 mg Oral TID  . sodium chloride flush  3 mL Intravenous Q12H  . spironolactone  12.5 mg Oral Daily  . torsemide  80 mg Oral BID  . vitamin B-12  500 mcg Oral Daily   Continuous Infusions: . sodium chloride Stopped (05/10/19 1103)  . sodium chloride    . treprostinil (REMODULIN) infusion 81 ng/kg/min (05/14/19 1154)     LOS: 14 days     Cordelia Poche, MD [facility] 05/16/2019, 9:09 AM  If 7PM-7AM, please contact night-coverage [email or web address]

## 2019-05-17 LAB — BASIC METABOLIC PANEL
Anion gap: 8 (ref 5–15)
BUN: 15 mg/dL (ref 6–20)
CO2: 28 mmol/L (ref 22–32)
Calcium: 8.4 mg/dL — ABNORMAL LOW (ref 8.9–10.3)
Chloride: 102 mmol/L (ref 98–111)
Creatinine, Ser: 0.89 mg/dL (ref 0.44–1.00)
GFR calc Af Amer: >60 mL/min (ref >60)
GFR calc non Af Amer: >60 mL/min (ref >60)
Glucose, Bld: 127 mg/dL — ABNORMAL HIGH (ref 70–99)
Potassium: 3.1 mmol/L — ABNORMAL LOW (ref 3.5–5.1)
Sodium: 138 mmol/L (ref 135–145)

## 2019-05-17 LAB — GLUCOSE, CAPILLARY
Glucose-Capillary: 104 mg/dL — ABNORMAL HIGH (ref 70–99)
Glucose-Capillary: 111 mg/dL — ABNORMAL HIGH (ref 70–99)
Glucose-Capillary: 116 mg/dL — ABNORMAL HIGH (ref 70–99)
Glucose-Capillary: 116 mg/dL — ABNORMAL HIGH (ref 70–99)
Glucose-Capillary: 139 mg/dL — ABNORMAL HIGH (ref 70–99)
Glucose-Capillary: 141 mg/dL — ABNORMAL HIGH (ref 70–99)

## 2019-05-17 LAB — CBC
HCT: 37.6 % (ref 36.0–46.0)
Hemoglobin: 11.7 g/dL — ABNORMAL LOW (ref 12.0–15.0)
MCH: 27 pg (ref 26.0–34.0)
MCHC: 31.1 g/dL (ref 30.0–36.0)
MCV: 86.6 fL (ref 80.0–100.0)
Platelets: 385 10*3/uL (ref 150–400)
RBC: 4.34 MIL/uL (ref 3.87–5.11)
RDW: 17.5 % — ABNORMAL HIGH (ref 11.5–15.5)
WBC: 8.9 10*3/uL (ref 4.0–10.5)
nRBC: 0 % (ref 0.0–0.2)

## 2019-05-17 MED ORDER — POTASSIUM CHLORIDE CRYS ER 20 MEQ PO TBCR
40.0000 meq | EXTENDED_RELEASE_TABLET | Freq: Once | ORAL | Status: AC
  Administered 2019-05-17: 40 meq via ORAL
  Filled 2019-05-17: qty 2

## 2019-05-17 NOTE — Consult Note (Signed)
[facility] Nurse Consult Note: ASked to reconsult due to the presence of "purulence and odor"  An occlusive transparent film has been placed over the right inner thigh (site of resolving cellulitis) Reason for Consult:Re-assess right upper thigh Wound type:infectious Pressure Injury POA: NA Measurement: 12 cm x 5 cm scabbed lesion.  6 cm x 5 cm area has been covered with transparent film and scab has started to breakdown.  I assure patient that this is breakdown of the scab and not pus. We will remove this dressing and implement a non-occlusive topical treatment. WOC instructions from 05/14/19 were to leave dry scabbing area open to air.   Wound BEM:LJQG that is partial removed.  Drainage (amount, consistency, odor) minimal tan by product under occlusive dressing, consistent with breakdown of scab.  Periwound:erythema and intact scab distal to dressing.   Dressing procedure/placement/frequency:Cleanse right inner thigh wound with NS and pat dry.  Apply Xeroform gauze to scabbed area.  Cover with ABD pad and tape. Change daily.  Will not follow at this time.  Please re-consult if needed.  Domenic Moras MSN, RN, FNP-BC CWON Wound, Ostomy, Continence Nurse Pager [phone]

## 2019-05-17 NOTE — TOC Progression Note (Signed)
Transition of Care [facility]) - Progression Note    Patient Details  Name: [name] MRN: [id number] Date of Birth: 05-16-1960  Transition of Care [facility]) CM/SW [place], [place] Phone Number: 05/17/2019, 4:39 PM  Clinical Narrative:     CSW presented SNF offers with medicare ratings to Pt. Pt selects [facility]. CSW will follow up with Son in AM.   Expected Discharge Plan: [place] Barriers to Discharge: Continued Medical Work up  Expected Discharge Plan and Services Expected Discharge Plan: [place] arrangements for the past 2 months: Single Family Home                                       Social Determinants of Health (SDOH) Interventions    Readmission Risk Interventions No flowsheet data found.

## 2019-05-17 NOTE — Progress Notes (Signed)
Physical Therapy Treatment Patient Details Name: [name] MRN: [id number] DOB: [birth year] Today's Date: 05/17/2019    History of Present Illness 59 y/o female with h/o PAH on pulmonary vasodilators, peripheral neuropathy, rurinary incontinence admitted on 4/28 with severe sepsis in the setting of pneumonia and cellulitis.    PT Comments    Pt making good progress.  Does need cues for safety, rest breaks, and breathing technique.  Pt fatigues easily with sats decreasing to 87% requiring 1 min to recover.  Pt requires assist with ADLs and balance.  Does not have 24 hr support cont to recommend SNF.    Follow Up Recommendations  SNF;Supervision/Assistance - 24 hour     Equipment Recommendations  3in1 (PT);Rolling walker with 5" wheels    Recommendations for Other Services       Precautions / Restrictions Precautions Precautions: Fall Precaution Comments: watch O2    Mobility  Bed Mobility               General bed mobility comments: On BSC at arrival - required max A for ADLs  Transfers Overall transfer level: Needs assistance Equipment used: Rolling walker (2 wheeled) Transfers: Sit to/from [facility] Sit to Stand: Min assist Stand pivot transfers: Min assist       General transfer comment: sit to stand x 6 throughout session; cues for safe hand placement and to lean forward to stand  Ambulation/Gait Ambulation/Gait assistance: Min guard Gait Distance (Feet): 30 Feet(x3) Assistive device: Rolling walker (2 wheeled) Gait Pattern/deviations: Step-through pattern;Wide base of support;Trunk flexed Gait velocity: slower   General Gait Details: Cues for RW proximity and posture.  Pt was on 7 L HFNC with O2 sats 93% rest, dropped to 87% walking and took 1 min to recover.  Pt given freqent rest breaks for O2 sats to recover.   Stairs             Wheelchair Mobility    Modified Rankin (Stroke Patients Only)       Balance Overall  balance assessment: Needs assistance Sitting-balance support: No upper extremity supported Sitting balance-Leahy Scale: Good       Standing balance-Leahy Scale: Fair Standing balance comment: required RW                            Cognition Arousal/Alertness: Awake/alert Behavior During Therapy: WFL for tasks assessed/performed Overall Cognitive Status: History of cognitive impairments - at baseline                                        Exercises      General Comments General comments (skin integrity, edema, etc.): Pt on 7 L HFNC with sats down to 87% with walking requiring 1 min to recvoer      Pertinent Vitals/Pain Pain Assessment: No/denies pain    Home Living                      Prior Function            PT Goals (current goals can now be found in the care plan section) Progress towards PT goals: Progressing toward goals    Frequency    Min 2X/week      PT Plan Current plan remains appropriate    Co-evaluation  AM-PAC PT "6 Clicks" Mobility   Outcome Measure  Help needed turning from your back to your side while in a flat bed without using bedrails?: A Little Help needed moving from lying on your back to sitting on the side of a flat bed without using bedrails?: A Little Help needed moving to and from a bed to a chair (including a wheelchair)?: A Little Help needed standing up from a chair using your arms (e.g., wheelchair or bedside chair)?: A Little Help needed to walk in hospital room?: A Little Help needed climbing 3-5 steps with a railing? : A Lot 6 Click Score: 17    End of Session Equipment Utilized During Treatment: Gait belt;Oxygen Activity Tolerance: Patient tolerated treatment well Patient left: in chair;with call bell/phone within reach;with chair alarm set Nurse Communication: Mobility status(Pt used BSC (left for charting)) PT Visit Diagnosis: Unsteadiness on feet  (R26.81);Difficulty in walking, not elsewhere classified (R26.2)     Time: 9295-7473 PT Time Calculation (min) (ACUTE ONLY): 38 min  Charges:  $Gait Training: 23-37 mins $Therapeutic Activity: 8-22 mins                     Maggie Font, PT Acute Rehab Services Pager [phone] [facility] [phone]    Karlton Lemon 05/17/2019, 3:04 PM

## 2019-05-17 NOTE — TOC Progression Note (Signed)
Transition of Care [facility]) - Progression Note    Patient Details  Name: [name] MRN: [id number] Date of Birth: 03/11/1960  Transition of Care [facility]) CM/SW [place], [place] Phone Number: 05/17/2019, 9:57 AM  Clinical Narrative:      Corky Sox with [facility] called and notified that they can extend a bed offer for 30+ day stay.   Expected Discharge Plan: [place] Barriers to Discharge: Continued Medical Work up  Expected Discharge Plan and Services Expected Discharge Plan: [place] arrangements for the past 2 months: Single Family Home                                       Social Determinants of Health (SDOH) Interventions    Readmission Risk Interventions No flowsheet data found.

## 2019-05-17 NOTE — Progress Notes (Signed)
PROGRESS NOTE    [name]  MVE:[id number] DOB: 1960/10/30 DOA: 05/02/2019 PCP: [name], MD   Brief Narrative: [name] is a 59 y.o. female with a history of severe PAH on Remodulin and sildenafil, OSA, morbid obesity, diabetes type 2, CKD. Patient presented secondary to fever, cough, hypotension and worsening hypoxia with evidence of sepsis from cellulitis and pneumonia in addition to acute heart failure.   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 Patient is on baseline 4 L oxygen as an outpatient. Currently still requiring HFNC -Wean to home 4 L as able  Septic shock Secondary to pneumonia/LE cellulitis and bacteremia. Patient required ICU admission and IV vasopressor support. Sepsis resolved. Patient completed antibiotic course.  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 Weight downtrended initially with diuresis. -Cardiology recommendations: Continue diuresis (restarted on 5/12) with torsemide 80 mg BID and acetazolamide 250 mg BID, continue Remodulin IV  Demand ischemia Initial concern for NSTEMI. Cardiology consulted performed left/right heart catheterization without evidence of significant obstructive disease.   Streptococcus bacteremia Completed antibiotic course.  Right lower extremity cellulitis Treated with vancomycin and cefazolin initially  and transitioned to amoxacillin to complete antibiotic course.  Paroxysmal atrial fibrillation -Continue Xarelto  Non-sustained V-tach Watch electrolytes. Continue telemetry  Obesity Body mass index is 49.99 kg/m.   DVT prophylaxis: Xarelto Code Status:   Code Status: Partial Code Family Communication: None at bedside Disposition Plan: Discharge to SNF when able to wean to baseline oxygen in addition to availability of SNF bed   Consultants:   Cardiology  PCCM  Infectious disease  Procedures:   TRANSTHORACIC ECHOCARDIOGRAM (05/03/2019) IMPRESSIONS    1. Pulmonary pressures are much lower than on previous studies. Given the  reduced right ventricular systolic function, this may represent  progressive right ventricular dysfunction rather than improvement.  2. Left ventricular ejection fraction, by estimation, is 65 to 70%. The  left ventricle has normal function. The left ventricle has no regional  wall motion abnormalities. Left ventricular diastolic parameters are  consistent with Grade I diastolic  dysfunction (impaired relaxation).  3. Right ventricular systolic function is moderately reduced. The right  ventricular size is moderately enlarged. There is normal pulmonary artery  systolic pressure.  4. The mitral valve is normal in structure. No evidence of mitral valve  regurgitation. No evidence of mitral stenosis.  5. The aortic valve is tricuspid. Aortic valve regurgitation is not  visualized. No aortic stenosis is present.  6. The inferior vena cava is dilated in size with <50% respiratory  variability, suggesting right atrial pressure of 15 mmHg.    TUNNELED LINE (05/08/2019)  LEFT/RIGHT CARDIAC CATHETERIZATION (05/14/2019) Assessment: 1. 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 Normal LVEF 60-65% 3. Low volume status 4. Mild to moderate PAH (much improved with remodulin)   Plan/Discussion:  No evidence of significant CAD. Hemodynamically  optimized on current regimen. Stop IV lasix.  Antimicrobials:  Vancomycin  Zosyn  Flagyl  Ceftriaxone  Cefepime  Azithromycin   Subjective: No issues today. No dyspnea.  Objective: Vitals:   05/16/19 2200 05/16/19 2253 05/17/19 0400 05/17/19 0823  BP:    110/66  Pulse: 88 90  80  Resp: (!) _0 Temp:    98.1 F (36.7 C)  TempSrc:    Oral  SpO2: 92% 92%  97%  Weight:   128 kg   Height:        Intake/Output Summary (Last 24 hours) at 05/17/2019 1404 Last data filed at 05/17/2019 1100 Gross per 24 hour  Intake 720 ml  Output 2100 ml  Net -1380 ml   Filed Weights   05/15/19 0440 05/16/19 0422 05/17/19 0400  Weight: 125 kg 127.2 kg 128 kg    Examination:  General exam: Appears calm and comfortable Respiratory system: Mild rales to auscultation. Respiratory effort normal. Cardiovascular system: S1 & S2 heard, RRR. 2/6 systolic murmur. Gastrointestinal system: Abdomen is nondistended, soft and nontender. Normal bowel sounds heard. Central nervous system: Alert and oriented. No focal neurological deficits. Extremities: No calf tenderness Skin: No cyanosis. No rashes Psychiatry: Judgement and insight appear normal. Mood & affect appropriate.     Data Reviewed: I have personally reviewed following labs and imaging studies  CBC: Recent Labs  Lab 05/13/19 0602 05/13/19 0602 05/14/19 0715 05/14/19 0715 05/14/19 1340 05/14/19 1429 05/15/19 0525 05/16/19 0352 05/17/19 0412  WBC 11.9*  --  13.1*  --   --   --  10.6* 10.4 8.9  HGB 13.3   < > 12.2   < > 8.5* 14.6  14.6 12.4 12.4 11.7*  HCT 43.5   < > 39.3   < > 25.0* 43.0  43.0 39.7 39.3 37.6  MCV 87.3  --  87.1  --   --   --  87.4 87.3 86.6  PLT 210  --  243  --   --   --  280 314 385   < > = values in this interval not displayed.   Basic Metabolic Panel: Recent Labs  Lab 05/10/19 1535 05/11/19 0522 05/12/19 0154 05/12/19 0154 05/13/19 8412 05/13/19 8208 05/14/19 0715 05/14/19 0715  05/14/19 1340 05/14/19 1429 05/15/19 0525 05/16/19 0352 05/17/19 0412  NA 149*   < > 144   < > 143   < > 138   < > 159* 141  140 142 136 138  K 3.2*   < > 3.3*   < > 3.3*   < > 3.3*   < > <2.0* 3.4*  3.3* 3.4* 3.2* 3.1*  CL 107   < > 101   < > 102  --  101  --   --   --  103 102 102  CO2 32   < > 32   < > 32  --  30  --   --   --  _1 GLUCOSE 153*   < > 130*   < > 115*  --  108*  --   --   --  122* 113* 127*  BUN 15   < > 8   < > 10  --  15  --   --   --  _2 CREATININE 0.67   < > 0.72   < > 0.86  --  0.96  --   --   --  0.66 0.72 0.89  CALCIUM 8.2*   < >  8.0*   < > 8.5*  --  8.0*  --   --   --  8.3* 8.5* 8.4*  MG 2.0  --  1.8  --   --   --   --   --   --   --   --   --   --    < > = values in this interval not displayed.   GFR: Estimated Creatinine Clearance: 90.9 mL/min (by C-G formula based on SCr of 0.89 mg/dL). Liver Function Tests: Recent Labs  Lab 05/11/19 0522 05/12/19 0154  AST 26 25  ALT 13 14  ALKPHOS 60 54  BILITOT 1.2 1.0  PROT 5.9* 6.1*  ALBUMIN 2.4* 2.5*   No results for input(s): LIPASE, AMYLASE in the last 168 hours. No results for input(s): AMMONIA in the last 168 hours. Coagulation Profile: No results for input(s): INR, PROTIME in the last 168 hours. Cardiac Enzymes: No results for input(s): CKTOTAL, CKMB, CKMBINDEX, TROPONINI in the last 168 hours. BNP (last 3 results) No results for input(s): PROBNP in the last 8760 hours. HbA1C: No results for input(s): HGBA1C in the last 72 hours. CBG: Recent Labs  Lab 05/16/19 2050 05/16/19 2352 05/17/19 0341 05/17/19 0826 05/17/19 1121  GLUCAP 139* 127* 116* 104* 116*   Lipid Profile: No results for input(s): CHOL, HDL, LDLCALC, TRIG, CHOLHDL, LDLDIRECT in the last 72 hours. Thyroid Function Tests: No results for input(s): TSH, T4TOTAL, FREET4, T3FREE, THYROIDAB in the last 72 hours. Anemia Panel: No results for input(s): VITAMINB12, FOLATE, FERRITIN, TIBC, IRON, RETICCTPCT in the last 72  hours. Sepsis Labs: No results for input(s): PROCALCITON, LATICACIDVEN in the last 168 hours.  No results found for this or any previous visit (from the past 240 hour(s)).       Radiology Studies: No results found.      Scheduled Meds: . acetaZOLAMIDE  250 mg Oral BID  . Chlorhexidine Gluconate Cloth  6 each Topical Daily  . ferrous sulfate  325 mg Oral Q breakfast  . gabapentin  600 mg Oral BID  . insulin aspart  0-20 Units Subcutaneous TID WC  . mouth rinse  15 mL Mouth Rinse BID  . pantoprazole  40 mg Oral QHS  . potassium chloride  40 mEq Oral BID  . potassium chloride  40 mEq Oral Once  . pravastatin  20 mg Oral Daily  . rivaroxaban  20 mg Oral Q supper  . sildenafil  20 mg Oral TID  . sodium chloride flush  3 mL Intravenous Q12H  . spironolactone  12.5 mg Oral Daily  . torsemide  80 mg Oral BID  . vitamin B-12  500 mcg Oral Daily   Continuous Infusions: . sodium chloride Stopped (05/10/19 1103)  . sodium chloride    . treprostinil (REMODULIN) infusion 81 ng/kg/min (05/16/19 1235)     LOS: 15 days     Cordelia Poche, MD [facility] 05/17/2019, 2:04 PM  If 7PM-7AM, please contact night-coverage [email or web address]

## 2019-05-17 NOTE — Plan of Care (Signed)
  Problem: Clinical Measurements: Goal: Ability to avoid or minimize complications of infection will improve Outcome: Progressing   Problem: Skin Integrity: Goal: Skin integrity will improve Outcome: Progressing   Problem: Education: Goal: Knowledge of General Education information will improve Description: Including pain rating scale, medication(s)/side effects and non-pharmacologic comfort measures Outcome: Progressing   Problem: Health Behavior/Discharge Planning: Goal: Ability to manage health-related needs will improve Outcome: Progressing   Problem: Clinical Measurements: Goal: Ability to maintain clinical measurements within normal limits will improve Outcome: Progressing Goal: Will remain free from infection Outcome: Progressing Goal: Diagnostic test results will improve Outcome: Progressing Goal: Respiratory complications will improve Outcome: Progressing Goal: Cardiovascular complication will be avoided Outcome: Progressing

## 2019-05-18 LAB — GLUCOSE, CAPILLARY
Glucose-Capillary: 110 mg/dL — ABNORMAL HIGH (ref 70–99)
Glucose-Capillary: 101 mg/dL — ABNORMAL HIGH (ref 70–99)
Glucose-Capillary: 118 mg/dL — ABNORMAL HIGH (ref 70–99)
Glucose-Capillary: 184 mg/dL — ABNORMAL HIGH (ref 70–99)
Glucose-Capillary: 161 mg/dL — ABNORMAL HIGH (ref 70–99)

## 2019-05-18 LAB — BASIC METABOLIC PANEL
Anion gap: 8 (ref 5–15)
BUN: 18 mg/dL (ref 6–20)
CO2: 29 mmol/L (ref 22–32)
Calcium: 8.6 mg/dL — ABNORMAL LOW (ref 8.9–10.3)
Chloride: 101 mmol/L (ref 98–111)
Creatinine, Ser: 0.83 mg/dL (ref 0.44–1.00)
GFR calc Af Amer: >60 mL/min (ref >60)
GFR calc non Af Amer: >60 mL/min (ref >60)
Glucose, Bld: 123 mg/dL — ABNORMAL HIGH (ref 70–99)
Potassium: 3.4 mmol/L — ABNORMAL LOW (ref 3.5–5.1)
Sodium: 138 mmol/L (ref 135–145)

## 2019-05-18 MED ORDER — METOLAZONE 5 MG PO TABS
5.0000 mg | ORAL_TABLET | Freq: Once | ORAL | Status: AC
  Administered 2019-05-18: 5 mg via ORAL
  Filled 2019-05-18: qty 1

## 2019-05-18 MED ORDER — POTASSIUM CHLORIDE CRYS ER 20 MEQ PO TBCR
40.0000 meq | EXTENDED_RELEASE_TABLET | Freq: Once | ORAL | Status: AC
  Administered 2019-05-18: 40 meq via ORAL
  Filled 2019-05-18: qty 2

## 2019-05-18 MED ORDER — POTASSIUM CHLORIDE CRYS ER 20 MEQ PO TBCR
40.0000 meq | EXTENDED_RELEASE_TABLET | Freq: Once | ORAL | Status: AC
  Administered 2019-05-18: 40 meq via ORAL

## 2019-05-18 NOTE — TOC Progression Note (Signed)
Transition of Care [facility]) - Progression Note    Patient Details  Name: IRISA GRIMSLEY MRN: [id number] Date of Birth: [birth year]  Transition of Care [facility]) CM/SW [place], [place] Phone Number: 05/18/2019, 2:15 PM  Clinical Narrative:     CSW called son presented bed offers and medicare ratings. CSW explained pts choice of [facility]. Son will speak with pt further and confirm decision once discussed. CSW also explained pt would likely have to commit to 30+ days. And may need to use SSDI check.   Expected Discharge Plan: [place] Barriers to Discharge: Continued Medical Work up  Expected Discharge Plan and Services Expected Discharge Plan: [place] arrangements for the past 2 months: Single Family Home                                       Social Determinants of Health (SDOH) Interventions    Readmission Risk Interventions No flowsheet data found.

## 2019-05-18 NOTE — Progress Notes (Signed)
PROGRESS NOTE    [name]  JYN:[id number] DOB: Oct 07, 1960 DOA: 05/02/2019 PCP: [name], MD   Brief Narrative: [name] is a 59 y.o. female with a history of severe PAH on Remodulin and sildenafil, OSA, morbid obesity, diabetes type 2, CKD. Patient presented secondary to fever, cough, hypotension and worsening hypoxia with evidence of sepsis from cellulitis and pneumonia in addition to acute heart failure.   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 Patient is on baseline 4 L oxygen as an outpatient. Currently still requiring HFNC at 7 L -Wean to home 4 L as able  Septic shock Secondary to pneumonia/LE cellulitis and bacteremia. Patient required ICU admission and IV vasopressor support. Sepsis resolved. Patient completed antibiotic course.  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 Weight downtrended initially with diuresis. -Cardiology recommendations: Continue diuresis (restarted on 5/12) with torsemide 80 mg BID and acetazolamide 250 mg BID, continue Remodulin IV  Demand ischemia Initial concern for NSTEMI. Cardiology consulted performed left/right heart catheterization without evidence of significant obstructive disease.   Streptococcus bacteremia Completed antibiotic course.  Right lower extremity cellulitis Treated with vancomycin and cefazolin  initially and transitioned to amoxacillin to complete antibiotic course.  Paroxysmal atrial fibrillation -Continue Xarelto  Non-sustained V-tach Watch electrolytes. Continue telemetry  Obesity Body mass index is 49.99 kg/m.   DVT prophylaxis: Xarelto Code Status:   Code Status: Partial Code Family Communication: None at bedside Disposition Plan: Discharge to SNF when able to wean to baseline oxygen in addition to availability of SNF bed   Consultants:   Cardiology  PCCM  Infectious disease  Procedures:   TRANSTHORACIC ECHOCARDIOGRAM (05/03/2019) IMPRESSIONS    1. Pulmonary pressures are much lower than on previous studies. Given the  reduced right ventricular systolic function, this may represent  progressive right ventricular dysfunction rather than improvement.  2. Left ventricular ejection fraction, by estimation, is 65 to 70%. The  left ventricle has normal function. The left ventricle has no regional  wall motion abnormalities. Left ventricular diastolic parameters are  consistent with Grade I diastolic  dysfunction (impaired relaxation).  3. Right ventricular systolic function is moderately reduced. The right  ventricular size is moderately enlarged. There is normal pulmonary artery  systolic pressure.  4. The mitral valve is normal in structure. No evidence of mitral valve  regurgitation. No evidence of mitral stenosis.  5. The aortic valve is tricuspid. Aortic valve regurgitation is not  visualized. No aortic stenosis is present.  6. The inferior vena cava is dilated in size with <50% respiratory  variability, suggesting right atrial pressure of 15 mmHg.    TUNNELED LINE (05/08/2019)  LEFT/RIGHT CARDIAC CATHETERIZATION (05/14/2019) Assessment: 1. 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 Normal LVEF 60-65% 3. Low volume status 4. Mild to moderate PAH (much improved with remodulin)   Plan/Discussion:  No evidence of significant CAD.  Hemodynamically optimized on current regimen.  Stop IV lasix.    Antimicrobials:  Vancomycin  Zosyn  Flagyl  Ceftriaxone  Cefepime  Azithromycin   Subjective: No concerns today other than not receiving medications yet. Was abel to transfer to the chair today with assistance.  Objective: Vitals:   05/18/19 0420 05/18/19 0421 05/18/19 0422 05/18/19 0820  BP: (!) 101/54   102/68  Pulse: 82 (!) 47 80 75  Resp: 20 (!) _0 Temp:   (!) 97.3 F (36.3 C) 97.6 F (36.4 C)  TempSrc:   Axillary Oral  SpO2: 95% 94% 95% 96%  Weight:      Height:        Intake/Output Summary (Last 24 hours) at 05/18/2019 1121 Last data filed at 05/18/2019 0956 Gross per 24 hour  Intake 3 ml  Output 1650 ml  Net -1647 ml   Filed Weights   05/15/19 0440 05/16/19 0422 05/17/19 0400  Weight: 125 kg 127.2 kg 128 kg    Examination:  General exam: Appears calm and comfortable Respiratory system: Clear to auscultation. Respiratory effort normal. Cardiovascular system: S1 & S2 heard, RRR. Gastrointestinal system: Abdomen is nondistended, soft and nontender.  Normal bowel sounds heard. Central nervous system: Alert and oriented. No focal neurological deficits. Extremities: LE edema. No calf tenderness Skin: No cyanosis. No rashes Psychiatry: Judgement and insight appear normal. Mood & affect appropriate.     Data Reviewed: I have personally reviewed following labs and imaging studies  CBC: Recent Labs  Lab 05/13/19 0602 05/13/19 0602 05/14/19 0715 05/14/19 0715 05/14/19 1340 05/14/19 1429 05/15/19 0525 05/16/19 0352 05/17/19 0412  WBC 11.9*  --  13.1*  --   --   --  10.6* 10.4 8.9  HGB 13.3   < > 12.2   < > 8.5* 14.6  14.6 12.4 12.4 11.7*  HCT 43.5   < > 39.3   < > 25.0* 43.0  43.0 39.7 39.3 37.6  MCV 87.3  --  87.1  --   --   --  87.4 87.3 86.6  PLT 210  --  243  --   --   --  280 314 385   < > = values in this interval not displayed.   Basic Metabolic Panel: Recent  Labs  Lab 05/12/19 0154 05/13/19 0752 05/14/19 0715 05/14/19 1340 05/14/19 1429 05/15/19 0525 05/16/19 0352 05/17/19 0412 05/18/19 0456  NA 144   < > 138   < > 141  140 142 136 138 138  K 3.3*   < > 3.3*   < > 3.4*  3.3* 3.4* 3.2* 3.1* 3.4*  CL 101   < > 101  --   --  103 102 102 101  CO2 32   < > 30  --   --  _1 GLUCOSE 130*   < > 108*  --   --  122* 113* 127* 123*  BUN 8   < > 15  --   --  _2 CREATININE 0.72   < > 0.96  --   --  0.66 0.72 0.89 0.83  CALCIUM 8.0*   < > 8.0*  --   --  8.3* 8.5* 8.4* 8.6*  MG 1.8  --   --   --   --   --   --   --   --    < > = values in this interval not displayed.   GFR: Estimated Creatinine Clearance: 97.5 mL/min (  by C-G formula based on SCr of 0.83 mg/dL). Liver Function Tests: Recent Labs  Lab 05/12/19 0154  AST 25  ALT 14  ALKPHOS 54  BILITOT 1.0  PROT 6.1*  ALBUMIN 2.5*   No results for input(s): LIPASE, AMYLASE in the last 168 hours. No results for input(s): AMMONIA in the last 168 hours. Coagulation Profile: No results for input(s): INR, PROTIME in the last 168 hours. Cardiac Enzymes: No results for input(s): CKTOTAL, CKMB, CKMBINDEX, TROPONINI in the last 168 hours. BNP (last 3 results) No results for input(s): PROBNP in the last 8760 hours. HbA1C: No results for input(s): HGBA1C in the last 72 hours. CBG: Recent Labs  Lab 05/17/19 1710 05/17/19 2002 05/17/19 2344 05/18/19 0333 05/18/19 0821  GLUCAP 139* 141* 111* 110* 101*   Lipid Profile: No results for input(s): CHOL, HDL, LDLCALC, TRIG, CHOLHDL, LDLDIRECT in the last 72 hours. Thyroid Function Tests: No results for input(s): TSH, T4TOTAL, FREET4, T3FREE, THYROIDAB in the last 72 hours. Anemia Panel: No results for input(s): VITAMINB12, FOLATE, FERRITIN, TIBC, IRON, RETICCTPCT in the last 72 hours. Sepsis Labs: No results for input(s): PROCALCITON, LATICACIDVEN in the last 168 hours.  No results found for this or any previous visit (from  the past 240 hour(s)).       Radiology Studies: No results found.      Scheduled Meds: . acetaZOLAMIDE  250 mg Oral BID  . Chlorhexidine Gluconate Cloth  6 each Topical Daily  . ferrous sulfate  325 mg Oral Q breakfast  . gabapentin  600 mg Oral BID  . insulin aspart  0-20 Units Subcutaneous TID WC  . mouth rinse  15 mL Mouth Rinse BID  . pantoprazole  40 mg Oral QHS  . potassium chloride  40 mEq Oral BID  . [COMPLETED] potassium chloride  40 mEq Oral Once  . potassium chloride  40 mEq Oral Once  . pravastatin  20 mg Oral Daily  . rivaroxaban  20 mg Oral Q supper  . sildenafil  20 mg Oral TID  . sodium chloride flush  3 mL Intravenous Q12H  . spironolactone  12.5 mg Oral Daily  . torsemide  80 mg Oral BID  . vitamin B-12  500 mcg Oral Daily   Continuous Infusions: . sodium chloride Stopped (05/10/19 1103)  . sodium chloride    . treprostinil (REMODULIN) infusion 81 ng/kg/min (05/18/19 1101)     LOS: 16 days     Cordelia Poche, MD [facility] 05/18/2019, 11:21 AM  If 7PM-7AM, please contact night-coverage [email or web address]

## 2019-05-18 NOTE — Progress Notes (Signed)
Advanced Heart Failure Rounding Note   Subjective:     Cath with normal coronaries and much improved hemodynamics. Breathing better.  Feels ok. Denies orthopnea or PND.  Weight trending up on po diuretics. .    Objective:   Weight Range:  Vital Signs:   Temp:  [97.3 F (36.3 C)-98.9 F (37.2 C)] 97.7 F (36.5 C) (05/14 1715) Pulse Rate:  [47-87] 75 (05/14 0820) Resp:  [13-29] 17 (05/14 0820) BP: (101-137)/(54-74) 102/68 (05/14 0820) SpO2:  [93 %-100 %] 93 % (05/14 1715) Last BM Date: 05/16/19  Weight change: Filed Weights   05/15/19 0440 05/16/19 0422 05/17/19 0400  Weight: 125 kg 127.2 kg 128 kg    Intake/Output:   Intake/Output Summary (Last 24 hours) at 05/18/2019 1941 Last data filed at 05/18/2019 0956 Gross per 24 hour  Intake 3 ml  Output 1650 ml  Net -1647 ml     Physical Exam: General:  Sitting on side of bed. No resp difficulty HEENT: normal Neck: supple. JVP hard to see. Carotids 2+ bilat; no bruits. No lymphadenopathy or thryomegaly appreciated. Cor: PMI nondisplaced. Regular rate & rhythm.2/6 TR + hickman Lungs: clear Abdomen: obese soft, nontender, nondistended. No hepatosplenomegaly. No bruits or masses. Good bowel sounds. Extremities: no cyanosis, clubbing, rash, mild edema. Chronic venous stasis changes with scalded skin sloughing on RLE Neuro: alert & orientedx3, cranial nerves grossly intact. moves all 4 extremities w/o difficulty. Affect pleasant   Telemetry: NSR 70s personally reviewed.   Labs: Basic Metabolic Panel: Recent Labs  Lab 05/12/19 0154 05/13/19 0752 05/14/19 0715 05/14/19 0715 05/14/19 1340 05/14/19 1429 05/15/19 0525 05/15/19 0525 05/16/19 0352 05/17/19 0412 05/18/19 0456  NA 144   < > 138  --    < > 141  140 142  --  136 138 138  K 3.3*   < > 3.3*  --    < > 3.4*  3.3* 3.4*  --  3.2* 3.1* 3.4*  CL 101   < > 101  --   --   --  103  --  102 102 101  CO2 32   < > 30  --   --   --  31  --  _0 GLUCOSE  130*   < > 108*  --   --   --  122*  --  113* 127* 123*  BUN 8   < > 15  --   --   --  17  --  _1 CREATININE 0.72   < > 0.96  --   --   --  0.66  --  0.72 0.89 0.83  CALCIUM 8.0*   < > 8.0*   < >  --   --  8.3*   < > 8.5* 8.4* 8.6*  MG 1.8  --   --   --   --   --   --   --   --   --   --    < > = values in this interval not displayed.    Liver Function Tests: Recent Labs  Lab 05/12/19 0154  AST 25  ALT 14  ALKPHOS 54  BILITOT 1.0  PROT 6.1*  ALBUMIN 2.5*   No results for input(s): LIPASE, AMYLASE in the last 168 hours. No results for input(s): AMMONIA in the last 168 hours.  CBC: Recent Labs  Lab 05/13/19 0602 05/13/19 0602 05/14/19 0715 05/14/19 0715 05/14/19 1340 05/14/19 1429 05/15/19  7616 05/16/19 0352 05/17/19 0412  WBC 11.9*  --  13.1*  --   --   --  10.6* 10.4 8.9  HGB 13.3   < > 12.2   < > 8.5* 14.6  14.6 12.4 12.4 11.7*  HCT 43.5   < > 39.3   < > 25.0* 43.0  43.0 39.7 39.3 37.6  MCV 87.3  --  87.1  --   --   --  87.4 87.3 86.6  PLT 210  --  243  --   --   --  280 314 385   < > = values in this interval not displayed.    Cardiac Enzymes: No results for input(s): CKTOTAL, CKMB, CKMBINDEX, TROPONINI in the last 168 hours.  BNP: BNP (last 3 results) Recent Labs    06/15/18 0744 05/02/19 2350 05/07/19 1112  BNP 49.0 441.1* 325.9*    ProBNP (last 3 results) No results for input(s): PROBNP in the last 8760 hours.    Other results:  Imaging: No results found.   Medications:     Scheduled Medications: . Chlorhexidine Gluconate Cloth  6 each Topical Daily  . ferrous sulfate  325 mg Oral Q breakfast  . gabapentin  600 mg Oral BID  . insulin aspart  0-20 Units Subcutaneous TID WC  . mouth rinse  15 mL Mouth Rinse BID  . pantoprazole  40 mg Oral QHS  . potassium chloride  40 mEq Oral BID  . pravastatin  20 mg Oral Daily  . rivaroxaban  20 mg Oral Q supper  . sildenafil  20 mg Oral TID  . sodium chloride flush  3 mL Intravenous Q12H    . spironolactone  12.5 mg Oral Daily  . torsemide  80 mg Oral BID  . vitamin B-12  500 mcg Oral Daily    Infusions: . sodium chloride Stopped (05/10/19 1103)  . sodium chloride    . treprostinil (REMODULIN) infusion 81 ng/kg/min (05/18/19 1101)    PRN Medications: sodium chloride, acetaminophen, docusate sodium, lidocaine (PF), ondansetron (ZOFRAN) IV, polyethylene glycol, sodium chloride flush   Assessment/Plan:   1. Shock, septic - Likely source lower extremity cellulitis.She also had hickman catheter in R upper chest (has been removed).  - Initial Blood Cultures + for Strep Group G.  - F/u cultures negative - new Rt IJ hickman cath placed 5/5 - has finished abx,  2. LE cellulitis - much improved but WOC seeing for skin sloughing  3. A/C Hypoxic Respiratory Failure - Multifactorial. Improved - Hires CT 5/6 as above. + consolidation/atx and edema. Needs BIPAP and HFNC  - improved with IV diuresis. Hemodynamics optimized on cath - back on home torsemide but weight creeping back up. Will give 1 dose metolazone.    4. NSTEMI  -Trend 073>7106> 2856. Suspect demand ischemia - 2015 had myoview In EF 78% and no ischemia  - Cath with minimal CAD this admission  5. Severe PAH with Cor Pulmonale: - On IV remodulin with [facility]. - Sildenafil resumed 5/4 - New Hickman cath placed this admit - RH pressures improved on Remodulin  6. PAF - Maintaining NSR.  - Now back on Xarelto post cath   7. Acute on chronic diastolic HF - Filling pressures optimized on cath.  - volume increasing today. Add metolazone  8. PSVT; - keep K> 4.0 Mg > 2.0  - avoiding b-blocker with severe lung disease.  9. Hypokalemia - supp  OK for d/c to SNF from our standpoint. We will  follow at a distance.   Length of Stay: 16   Glori Bickers MD 05/18/2019, 7:41 PM  Advanced Heart Failure Team Pager [phone] (M-F; [place])  Please contact [facility] for night-coverage after hours (4p -7a  ) and weekends on [email or web address]

## 2019-05-19 LAB — GLUCOSE, CAPILLARY
Glucose-Capillary: 127 mg/dL — ABNORMAL HIGH (ref 70–99)
Glucose-Capillary: 116 mg/dL — ABNORMAL HIGH (ref 70–99)
Glucose-Capillary: 156 mg/dL — ABNORMAL HIGH (ref 70–99)
Glucose-Capillary: 115 mg/dL — ABNORMAL HIGH (ref 70–99)
Glucose-Capillary: 183 mg/dL — ABNORMAL HIGH (ref 70–99)

## 2019-05-19 NOTE — Progress Notes (Signed)
PROGRESS NOTE    JALYN DUTTA  ZYS:[id number] DOB: February 24, 1960 DOA: 05/02/2019 PCP: [name], MD   Brief Narrative: [name] is a 59 y.o. female with a history of severe PAH on Remodulin and sildenafil, OSA, morbid obesity, diabetes type 2, CKD. Patient presented secondary to fever, cough, hypotension and worsening hypoxia with evidence of sepsis from cellulitis and pneumonia in addition to acute heart failure.   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 Patient is on baseline 4 L oxygen as an outpatient. Currently still requiring HFNC at 5 L -Wean to home 4 L as able  Septic shock Secondary to pneumonia/LE cellulitis and bacteremia. Patient required ICU admission and IV vasopressor support. Sepsis resolved. Patient completed antibiotic course.  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 Weight downtrended initially with diuresis. -Cardiology recommendations: Continue diuresis (restarted on 5/12) with torsemide 80 mg BID and acetazolamide 250 mg BID, continue Remodulin IV; Metolazone added 5/14  Demand ischemia Initial concern for NSTEMI. Cardiology consulted performed left/right heart catheterization without evidence of significant obstructive disease.   Streptococcus bacteremia Completed antibiotic course.  Right lower extremity cellulitis Treated with  vancomycin and cefazolin initially and transitioned to amoxacillin to complete antibiotic course.  Paroxysmal atrial fibrillation -Continue Xarelto  Non-sustained V-tach Watch electrolytes. Continue telemetry  Obesity Body mass index is 49.99 kg/m.   DVT prophylaxis: Xarelto Code Status:   Code Status: Partial Code Family Communication: None at bedside Disposition Plan: Discharge to SNF when able to wean to baseline oxygen in addition to availability of SNF bed   Consultants:   Cardiology  PCCM  Infectious disease  Procedures:   TRANSTHORACIC ECHOCARDIOGRAM (05/03/2019) IMPRESSIONS    1. Pulmonary pressures are much lower than on previous studies. Given the  reduced right ventricular systolic function, this may represent  progressive right ventricular dysfunction rather than improvement.  2. Left ventricular ejection fraction, by estimation, is 65 to 70%. The  left ventricle has normal function. The left ventricle has no regional  wall motion abnormalities. Left ventricular diastolic parameters are  consistent with Grade I diastolic  dysfunction (impaired relaxation).  3. Right ventricular systolic function is moderately reduced. The right  ventricular size is moderately enlarged. There is normal pulmonary artery  systolic pressure.  4. The mitral valve is normal in structure. No evidence of mitral valve  regurgitation. No evidence of mitral stenosis.  5. The aortic valve is tricuspid. Aortic valve regurgitation is not  visualized. No aortic stenosis is present.  6. The inferior vena cava is dilated in size with <50% respiratory  variability, suggesting right atrial pressure of 15 mmHg.    TUNNELED LINE (05/08/2019)  LEFT/RIGHT CARDIAC CATHETERIZATION (05/14/2019) Assessment: 1. 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 Normal LVEF 60-65% 3. Low volume status 4. Mild to moderate PAH (much improved with remodulin)   Plan/Discussion:  No evidence of  significant CAD. Hemodynamically optimized  on current regimen. Stop IV lasix.    Antimicrobials:  Vancomycin  Zosyn  Flagyl  Ceftriaxone  Cefepime  Azithromycin   Subjective: No issues today. Feels like her swelling is improved.  Objective: Vitals:   05/18/19 2256 05/19/19 0400 05/19/19 0805 05/19/19 1000  BP: 104/65 107/67 (!) 92/53 108/72  Pulse: 95 82 92 92  Resp: 19 20 (!) 21 (!) 23  Temp:  98.4 F (36.9 C) 98.6 F (37 C)   TempSrc:  Axillary Axillary   SpO2: 97% 95% 95% 94%  Weight:      Height:        Intake/Output Summary (Last 24 hours) at 05/19/2019 1248 Last data filed at 05/19/2019 0400 Gross per 24 hour  Intake 120 ml  Output 2500 ml  Net -2380 ml   Filed Weights   05/15/19 0440 05/16/19 0422 05/17/19 0400  Weight: 125 kg 127.2 kg 128 kg    Examination:  General exam: Appears calm and comfortable Respiratory system: Diminished. Respiratory effort normal. Cardiovascular system: S1 & S2 heard, RRR. No murmurs, rubs, gallops or clicks. Gastrointestinal system: Abdomen is nondistended, soft and nontender. No organomegaly or masses felt. Normal bowel sounds heard. Central nervous system: Alert and oriented. No focal neurological deficits. Extremities: LE edema. No calf tenderness Skin: No cyanosis. No rashes. Chronic LE dermatitis changes Psychiatry: Judgement and insight appear normal. Mood & affect appropriate.      Data Reviewed: I have personally reviewed following labs and imaging studies  CBC: Recent Labs  Lab 05/13/19 0602 05/13/19 0602 05/14/19 0715 05/14/19 0715 05/14/19 1340 05/14/19 1429 05/15/19 0525 05/16/19 0352 05/17/19 0412  WBC 11.9*  --  13.1*  --   --   --  10.6* 10.4 8.9  HGB 13.3   < > 12.2   < > 8.5* 14.6  14.6 12.4 12.4 11.7*  HCT 43.5   < > 39.3   < > 25.0* 43.0  43.0 39.7 39.3 37.6  MCV 87.3  --  87.1  --   --   --  87.4 87.3 86.6  PLT 210  --  243  --   --   --  280 314 385   < > = values in this interval  not displayed.   Basic Metabolic Panel: Recent Labs  Lab 05/14/19 0715 05/14/19 1340 05/14/19 1429 05/15/19 0525 05/16/19 0352 05/17/19 0412 05/18/19 0456  NA 138   < > 141  140 142 136 138 138  K 3.3*   < > 3.4*  3.3* 3.4* 3.2* 3.1* 3.4*  CL 101  --   --  103 102 102 101  CO2 30  --   --  _0 GLUCOSE 108*  --   --  122* 113* 127* 123*  BUN 15  --   --  _1 CREATININE 0.96  --   --  0.66 0.72 0.89 0.83  CALCIUM 8.0*  --   --  8.3* 8.5* 8.4* 8.6*   < > = values in this interval not displayed.   GFR: Estimated Creatinine Clearance: 97.5 mL/min (by C-G formula based on SCr of 0.83 mg/dL). Liver Function Tests: No results for input(s): AST, ALT, ALKPHOS, BILITOT, PROT, ALBUMIN in the last 168 hours. No results for input(s): LIPASE, AMYLASE in the last 168 hours. No results for input(s): AMMONIA in the last 168 hours. Coagulation Profile: No results for input(s): INR, PROTIME in the last 168 hours. Cardiac Enzymes: No results for  input(s): CKTOTAL, CKMB, CKMBINDEX, TROPONINI in the last 168 hours. BNP (last 3 results) No results for input(s): PROBNP in the last 8760 hours. HbA1C: No results for input(s): HGBA1C in the last 72 hours. CBG: Recent Labs  Lab 05/18/19 1951 05/18/19 2307 05/19/19 0347 05/19/19 0802 05/19/19 1236  GLUCAP 184* 161* 127* 116* 156*   Lipid Profile: No results for input(s): CHOL, HDL, LDLCALC, TRIG, CHOLHDL, LDLDIRECT in the last 72 hours. Thyroid Function Tests: No results for input(s): TSH, T4TOTAL, FREET4, T3FREE, THYROIDAB in the last 72 hours. Anemia Panel: No results for input(s): VITAMINB12, FOLATE, FERRITIN, TIBC, IRON, RETICCTPCT in the last 72 hours. Sepsis Labs: No results for input(s): PROCALCITON, LATICACIDVEN in the last 168 hours.  No results found for this or any previous visit (from the past 240 hour(s)).       Radiology Studies: No results found.      Scheduled Meds: . Chlorhexidine Gluconate  Cloth  6 each Topical Daily  . ferrous sulfate  325 mg Oral Q breakfast  . gabapentin  600 mg Oral BID  . insulin aspart  0-20 Units Subcutaneous TID WC  . mouth rinse  15 mL Mouth Rinse BID  . pantoprazole  40 mg Oral QHS  . potassium chloride  40 mEq Oral BID  . pravastatin  20 mg Oral Daily  . rivaroxaban  20 mg Oral Q supper  . sildenafil  20 mg Oral TID  . sodium chloride flush  3 mL Intravenous Q12H  . spironolactone  12.5 mg Oral Daily  . torsemide  80 mg Oral BID  . vitamin B-12  500 mcg Oral Daily   Continuous Infusions: . sodium chloride Stopped (05/10/19 1103)  . sodium chloride    . treprostinil (REMODULIN) infusion 81 ng/kg/min (05/18/19 1101)     LOS: 17 days     Cordelia Poche, MD [facility] 05/19/2019, 12:48 PM  If 7PM-7AM, please contact night-coverage [email or web address]

## 2019-05-20 LAB — BASIC METABOLIC PANEL
Anion gap: 12 (ref 5–15)
BUN: 41 mg/dL — ABNORMAL HIGH (ref 6–20)
CO2: 32 mmol/L (ref 22–32)
Calcium: 9 mg/dL (ref 8.9–10.3)
Chloride: 90 mmol/L — ABNORMAL LOW (ref 98–111)
Creatinine, Ser: 1.48 mg/dL — ABNORMAL HIGH (ref 0.44–1.00)
GFR calc Af Amer: 45 mL/min — ABNORMAL LOW (ref >60)
GFR calc non Af Amer: 39 mL/min — ABNORMAL LOW (ref >60)
Glucose, Bld: 139 mg/dL — ABNORMAL HIGH (ref 70–99)
Potassium: 3.2 mmol/L — ABNORMAL LOW (ref 3.5–5.1)
Sodium: 134 mmol/L — ABNORMAL LOW (ref 135–145)

## 2019-05-20 LAB — GLUCOSE, CAPILLARY
Glucose-Capillary: 126 mg/dL — ABNORMAL HIGH (ref 70–99)
Glucose-Capillary: 129 mg/dL — ABNORMAL HIGH (ref 70–99)
Glucose-Capillary: 137 mg/dL — ABNORMAL HIGH (ref 70–99)
Glucose-Capillary: 156 mg/dL — ABNORMAL HIGH (ref 70–99)
Glucose-Capillary: 158 mg/dL — ABNORMAL HIGH (ref 70–99)
Glucose-Capillary: 174 mg/dL — ABNORMAL HIGH (ref 70–99)
Glucose-Capillary: 202 mg/dL — ABNORMAL HIGH (ref 70–99)

## 2019-05-20 NOTE — Progress Notes (Signed)
PROGRESS NOTE    [name]  EZM:[id number] DOB: Aug 24, 1960 DOA: 05/02/2019 PCP: [name], MD   Brief Narrative: [name] is a 59 y.o. female with a history of severe PAH on Remodulin and sildenafil, OSA, morbid obesity, diabetes type 2, CKD. Patient presented secondary to fever, cough, hypotension and worsening hypoxia with evidence of sepsis from cellulitis and pneumonia in addition to acute heart failure.   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 Patient is on baseline 4 L oxygen as an outpatient. Currently still requiring HFNC at 5 L -Wean to home 4 L as able  Septic shock Secondary to pneumonia/LE cellulitis and bacteremia. Patient required ICU admission and IV vasopressor support. Sepsis resolved. Patient completed antibiotic course.  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 Weight downtrended initially with diuresis.  -Cardiology recommendations: Continue diuresis (restarted on 5/12) with torsemide 80 mg BID. acetazolamide discontinued 5/14, continue Remodulin IV; Metolazone x1 given on 5/14  Hypotension Likely secondary to heavy diuresis. MAP is adequate. -Watch BP trend; patient is back on torsemide alone for diuresis  AKI Secondary to heavy diuresis. -Watch BMP with change in therapy back to torsemide monotherapy  Demand ischemia Initial  concern for NSTEMI. Cardiology consulted performed left/right heart catheterization without evidence of significant obstructive disease.   Streptococcus bacteremia Completed antibiotic course.  Right lower extremity cellulitis Treated with vancomycin and cefazolin initially and transitioned to amoxacillin to complete antibiotic course.  Paroxysmal atrial fibrillation -Continue Xarelto  Non-sustained V-tach Watch electrolytes. Continue telemetry  Obesity Body mass index is 46.2 kg/m.   DVT prophylaxis: Xarelto Code Status:   Code Status: Partial Code Family Communication: None at bedside Disposition Plan: Discharge to SNF when able to wean to baseline oxygen in addition to availability of SNF bed   Consultants:   Cardiology  PCCM  Infectious disease  Procedures:   TRANSTHORACIC ECHOCARDIOGRAM (05/03/2019) IMPRESSIONS    1. Pulmonary pressures are much lower than on previous studies. Given the  reduced right ventricular systolic function, this may represent  progressive right ventricular dysfunction rather than improvement.  2. Left ventricular ejection fraction, by estimation, is 65 to 70%. The  left ventricle has normal function. The left ventricle has no regional  wall motion abnormalities. Left ventricular diastolic parameters are  consistent with Grade I diastolic  dysfunction (impaired relaxation).  3. Right ventricular systolic function is moderately reduced. The right  ventricular size is moderately enlarged. There is normal pulmonary artery  systolic pressure.  4. The mitral valve is normal in structure. No evidence of mitral valve  regurgitation. No evidence of mitral stenosis.  5. The aortic valve is tricuspid. Aortic valve regurgitation is not  visualized. No aortic stenosis is present.  6. The inferior vena cava is dilated in size with <50% respiratory  variability, suggesting right atrial pressure of 15 mmHg.    TUNNELED LINE  (05/08/2019)  LEFT/RIGHT  CARDIAC CATHETERIZATION (05/14/2019) Assessment: 1. 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 Normal LVEF 60-65% 3. Low volume status 4. Mild to moderate PAH (much improved with remodulin)   Plan/Discussion:  No evidence of significant CAD. Hemodynamically optimized on current regimen. Stop IV lasix.    Antimicrobials:  Vancomycin  Zosyn  Flagyl  Ceftriaxone  Cefepime  Azithromycin   Subjective: No issues noted.  Objective: Vitals:   05/20/19 0000 05/20/19 0151 05/20/19 0338 05/20/19 0358  BP: (!) 89/56 (!) 89/60 (!) 94/59   Pulse:   93   Resp: (!) 22 18 (!) 30 18  Temp: 99.2 F (37.3 C)  98.1 F (36.7 C)   TempSrc: Axillary  Oral   SpO2: 91% 95% 93% 94%  Weight:   118.3 kg   Height:        Intake/Output Summary (Last 24 hours) at 05/20/2019 0727 Last data filed at 05/20/2019 0640 Gross per 24 hour  Intake 118 ml  Output 1700 ml  Net -1582 ml   Filed Weights   05/16/19 0422 05/17/19 0400 05/20/19 0338  Weight: 127.2 kg 128 kg 118.3 kg    Examination:  General exam: Appears calm and comfortable   Data Reviewed: I have personally reviewed following labs and imaging studies  CBC: Recent Labs  Lab 05/14/19 0715 05/14/19 0715 05/14/19 1340 05/14/19 1429 05/15/19 0525 05/16/19 0352 05/17/19 0412  WBC 13.1*  --   --   --  10.6* 10.4 8.9  HGB 12.2   < > 8.5* 14.6  14.6 12.4 12.4 11.7*  HCT 39.3   < > 25.0* 43.0  43.0 39.7 39.3 37.6  MCV 87.1  --   --   --  87.4 87.3 86.6  PLT 243  --   --   --  280 314 385   < > = values in this interval not displayed.   Basic Metabolic Panel: Recent Labs  Lab 05/15/19 0525 05/16/19 0352 05/17/19 0412 05/18/19 0456 05/20/19 0623  NA 142 136 138 138 134*  K 3.4* 3.2* 3.1* 3.4* 3.2*  CL 103 102 102 101 90*  CO2 _0 32  GLUCOSE 122* 113* 127* 123* 139*  BUN _1 41*  CREATININE 0.66 0.72 0.89 0.83 1.48*  CALCIUM 8.3* 8.5* 8.4* 8.6* 9.0   GFR: Estimated  Creatinine Clearance: 52.2 mL/min (A) (by C-G formula based on SCr of 1.48 mg/dL (H)). Liver Function Tests: No results for input(s): AST, ALT, ALKPHOS, BILITOT, PROT, ALBUMIN in the last 168 hours. No results for input(s): LIPASE, AMYLASE in the last 168 hours. No results for input(s): AMMONIA in the last 168 hours. Coagulation Profile: No results for input(s): INR, PROTIME in the last 168 hours. Cardiac Enzymes: No results for input(s): CKTOTAL, CKMB, CKMBINDEX, TROPONINI in the last 168 hours. BNP (last 3 results) No results for input(s): PROBNP in the last 8760 hours. HbA1C: No results for input(s): HGBA1C in the last 72 hours. CBG: Recent Labs  Lab 05/19/19 1236 05/19/19 1431 05/19/19 2002 05/20/19 0007 05/20/19 0336  GLUCAP 156* 115* 183* 137* 129*   Lipid Profile: No results for input(s): CHOL, HDL, LDLCALC, TRIG, CHOLHDL, LDLDIRECT in the last 72 hours. Thyroid Function Tests: No results for input(s): TSH, T4TOTAL, FREET4, T3FREE, THYROIDAB in the last 72 hours. Anemia Panel: No results for input(s): VITAMINB12, FOLATE, FERRITIN, TIBC, IRON, RETICCTPCT in the last 72 hours. Sepsis Labs: No results for input(s): PROCALCITON, LATICACIDVEN in the last 168 hours.  No results found for  this or any previous visit (from the past 240 hour(s)).       Radiology Studies: No results found.      Scheduled Meds: . Chlorhexidine Gluconate Cloth  6 each Topical Daily  . ferrous sulfate  325 mg Oral Q breakfast  . gabapentin  600 mg Oral BID  . insulin aspart  0-20 Units Subcutaneous TID WC  . mouth rinse  15 mL Mouth Rinse BID  . pantoprazole  40 mg Oral QHS  . potassium chloride  40 mEq Oral BID  . pravastatin  20 mg Oral Daily  . rivaroxaban  20 mg Oral Q supper  . sildenafil  20 mg Oral TID  . sodium chloride flush  3 mL Intravenous Q12H  . spironolactone  12.5 mg Oral Daily  . torsemide  80 mg Oral BID  . vitamin B-12  500 mcg Oral Daily   Continuous  Infusions: . sodium chloride Stopped (05/10/19 1103)  . sodium chloride    . treprostinil (REMODULIN) infusion 81 ng/kg/min (05/18/19 1101)     LOS: 18 days     Cordelia Poche, MD [facility] 05/20/2019, 7:27 AM  If 7PM-7AM, please contact night-coverage [email or web address]

## 2019-05-21 LAB — BASIC METABOLIC PANEL
Anion gap: 14 (ref 5–15)
BUN: 50 mg/dL — ABNORMAL HIGH (ref 6–20)
CO2: 34 mmol/L — ABNORMAL HIGH (ref 22–32)
Calcium: 8.7 mg/dL — ABNORMAL LOW (ref 8.9–10.3)
Chloride: 84 mmol/L — ABNORMAL LOW (ref 98–111)
Creatinine, Ser: 1.3 mg/dL — ABNORMAL HIGH (ref 0.44–1.00)
GFR calc Af Amer: 53 mL/min — ABNORMAL LOW (ref >60)
GFR calc non Af Amer: 46 mL/min — ABNORMAL LOW (ref >60)
Glucose, Bld: 140 mg/dL — ABNORMAL HIGH (ref 70–99)
Potassium: 2.6 mmol/L — CL (ref 3.5–5.1)
Sodium: 132 mmol/L — ABNORMAL LOW (ref 135–145)

## 2019-05-21 LAB — GLUCOSE, CAPILLARY
Glucose-Capillary: 128 mg/dL — ABNORMAL HIGH (ref 70–99)
Glucose-Capillary: 154 mg/dL — ABNORMAL HIGH (ref 70–99)
Glucose-Capillary: 212 mg/dL — ABNORMAL HIGH (ref 70–99)
Glucose-Capillary: 137 mg/dL — ABNORMAL HIGH (ref 70–99)
Glucose-Capillary: 145 mg/dL — ABNORMAL HIGH (ref 70–99)
Glucose-Capillary: 161 mg/dL — ABNORMAL HIGH (ref 70–99)

## 2019-05-21 LAB — MAGNESIUM: Magnesium: 2 mg/dL (ref 1.7–2.4)

## 2019-05-21 MED ORDER — POTASSIUM CHLORIDE CRYS ER 20 MEQ PO TBCR
40.0000 meq | EXTENDED_RELEASE_TABLET | Freq: Once | ORAL | Status: AC
  Administered 2019-05-21: 40 meq via ORAL
  Filled 2019-05-21: qty 2

## 2019-05-21 NOTE — TOC Progression Note (Signed)
Transition of Care [facility]) - Progression Note    Patient Details  Name: [name] MRN: [id number] Date of Birth: [birth year]  [facility] of [facility] CM/SW [place], [place] Phone Number: 05/21/2019, 1:34 PM  Clinical Narrative:    CSW met with patient bedside to discuss SNF choice. Patient expressed she spoke with her son and he is looking into the other SNF choices today. Patient requested CSW follow-up with son on Tuesday for bed choice.   Expected Discharge Plan: [place] Barriers to Discharge: Continued Medical Work up  Expected Discharge Plan and Services Expected Discharge Plan: [place] arrangements for the past 2 months: Single Family Home                                       Social Determinants of Health (SDOH) Interventions    Readmission Risk Interventions No flowsheet data found.

## 2019-05-21 NOTE — Progress Notes (Signed)
PROGRESS NOTE    [name]  LMB:[id number] DOB: 01/01/1961 DOA: 05/02/2019 PCP: [name], MD   Brief Narrative: [name] is a 59 y.o. female with a history of severe PAH on Remodulin and sildenafil, OSA, morbid obesity, diabetes type 2, CKD. Patient presented secondary to fever, cough, hypotension and worsening hypoxia with evidence of sepsis from cellulitis and pneumonia in addition to acute heart failure.   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 Patient is on baseline 4 L oxygen as an outpatient. Currently still requiring HFNC at 5 L -Wean to home 4 L as able  Septic shock Secondary to pneumonia/LE cellulitis and bacteremia. Patient required ICU admission and IV vasopressor support. Sepsis resolved. Patient completed antibiotic course.  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 Weight downtrended initially with diuresis.  -Cardiology recommendations: Continue diuresis (restarted on 5/12) with torsemide 80 mg BID. acetazolamide discontinued 5/14, continue Remodulin IV; Metolazone x1 given on 5/14  Hypotension Likely secondary to heavy diuresis. MAP is adequate. Improving with reduction in diuresis.  AKI Secondary to heavy diuresis. Improved slightly -Watch BMP with change in therapy back to torsemide; also on spironolactone  Hypokalemia In setting of  diuresis. On Spironolactone. Patient has been receiving scheduled potassium with torsemide dosing. -Will give one extra dose of Kdur -Check magnesium and replete if low -Recheck potassium this afternoon if magnesium levels are normal  Demand ischemia Initial concern for NSTEMI. Cardiology consulted performed left/right heart catheterization without evidence of significant obstructive disease.   Streptococcus bacteremia Completed antibiotic course.  Right lower extremity cellulitis Treated with vancomycin and cefazolin initially and transitioned to amoxacillin to complete antibiotic course.  Paroxysmal atrial fibrillation -Continue Xarelto  Non-sustained V-tach Watch electrolytes. Continue telemetry  Obesity Body mass index is 46.36 kg/m.   DVT prophylaxis: Xarelto Code Status:   Code Status: Partial Code Family Communication: None at bedside Disposition Plan: Discharge to SNF when able to wean to baseline oxygen in addition to availability of SNF bed   Consultants:   Cardiology  PCCM  Infectious disease  Procedures:   TRANSTHORACIC ECHOCARDIOGRAM (05/03/2019) IMPRESSIONS    1. Pulmonary pressures are much lower than on previous studies. Given the  reduced right ventricular systolic function, this may represent  progressive right ventricular dysfunction rather than improvement.  2. Left ventricular ejection fraction, by estimation, is 65 to 70%. The  left ventricle has normal function. The left ventricle has no regional  wall motion abnormalities. Left ventricular diastolic parameters are  consistent with Grade I diastolic  dysfunction (impaired relaxation).  3. Right ventricular systolic function is moderately reduced. The right  ventricular size is moderately enlarged. There is normal pulmonary artery  systolic pressure.  4. The mitral valve is normal in structure. No evidence of mitral valve  regurgitation. No evidence of mitral stenosis.  5. The aortic  valve is tricuspid. Aortic valve regurgitation is not  visualized. No aortic stenosis is present.  6. The inferior vena cava is dilated in size with <50% respiratory  variability, suggesting right atrial pressure of 15 mmHg.    TUNNELED LINE (05/08/2019)  LEFT/RIGHT CARDIAC CATHETERIZATION (05/14/2019) Assessment: 1. 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 Normal LVEF 60-65% 3. Low volume status 4. Mild to moderate PAH (much improved with remodulin)   Plan/Discussion:  No evidence of significant CAD. Hemodynamically optimized on current regimen. Stop IV lasix.    Antimicrobials:  Vancomycin  Zosyn  Flagyl  Ceftriaxone  Cefepime  Azithromycin   Subjective: No dyspnea  Objective: Vitals:   05/21/19 0100 05/21/19 0349 05/21/19 0730 05/21/19 0800  BP:  110/60 (!) 101/52   Pulse:  86 81   Resp: 18 (!) 23 (!) 24 19  Temp:  98.3 F (36.8 C) 98.1 F (36.7 C) 98 F (36.7 C)  TempSrc:  Axillary Axillary Oral  SpO2:  94% 91%   Weight:  118.7 kg    Height:        Intake/Output Summary (Last 24 hours) at 05/21/2019 0948 Last data filed at 05/21/2019 0600 Gross per 24 hour  Intake --  Output 2350 ml  Net -2350 ml   Filed Weights   05/17/19 0400 05/20/19 0338 05/21/19 0349  Weight: 128 kg 118.3 kg 118.7 kg    Examination:  General exam: Appears calm and comfortable Respiratory system: Clear to auscultation. Respiratory effort normal. Cardiovascular system: S1 & S2 heard, RRR. Gastrointestinal system: Abdomen is obese, nondistended, soft and nontender. No organomegaly or masses felt. Normal bowel sounds heard. Central nervous system: Alert and oriented. No focal neurological deficits. Musculoskeletal: Mild LE edema No calf tenderness Skin: No cyanosis. Ecchymosis of right lower leg Psychiatry: Judgement and insight appear normal. Mood & affect appropriate.    Data Reviewed: I have personally reviewed following labs and imaging studies  CBC: Recent Labs   Lab 05/14/19 1340 05/14/19 1429 05/15/19 0525 05/16/19 0352 05/17/19 0412  WBC  --   --  10.6* 10.4 8.9  HGB 8.5* 14.6  14.6 12.4 12.4 11.7*  HCT 25.0* 43.0  43.0 39.7 39.3 37.6  MCV  --   --  87.4 87.3 86.6  PLT  --   --  280 314 364   Basic Metabolic Panel: Recent Labs  Lab 05/16/19 0352 05/17/19 0412 05/18/19 0456 05/20/19 0623 05/21/19 0830  NA 136 138 138 134* 132*  K 3.2* 3.1* 3.4* 3.2* 2.6*  CL 102 102 101 90* 84*  CO2 _0 32 34*  GLUCOSE 113* 127* 123* 139* 140*  BUN _1 41* 50*  CREATININE 0.72 0.89 0.83 1.48* 1.30*  CALCIUM 8.5* 8.4* 8.6* 9.0 8.7*   GFR: Estimated Creatinine Clearance: 59.5 mL/min (A) (by C-G formula based on SCr of 1.3 mg/dL (H)). Liver Function Tests: No results for input(s): AST, ALT, ALKPHOS, BILITOT, PROT, ALBUMIN in the last 168 hours. No results for input(s): LIPASE, AMYLASE in the last 168 hours. No results for input(s): AMMONIA in the last 168 hours. Coagulation Profile: No results for input(s): INR, PROTIME in the last 168 hours. Cardiac Enzymes: No results for input(s): CKTOTAL, CKMB, CKMBINDEX, TROPONINI in the last 168 hours. BNP (last 3 results) No results for input(s): PROBNP in the last 8760 hours. HbA1C: No results for input(s): HGBA1C in the last 72 hours. CBG: Recent Labs  Lab 05/20/19 1651 05/20/19 1946 05/20/19 2348 05/21/19 0348 05/21/19 0731  GLUCAP 156* 202* 158* 128* 154*   Lipid Profile:  No results for input(s): CHOL, HDL, LDLCALC, TRIG, CHOLHDL, LDLDIRECT in the last 72 hours. Thyroid Function Tests: No results for input(s): TSH, T4TOTAL, FREET4, T3FREE, THYROIDAB in the last 72 hours. Anemia Panel: No results for input(s): VITAMINB12, FOLATE, FERRITIN, TIBC, IRON, RETICCTPCT in the last 72 hours. Sepsis Labs: No results for input(s): PROCALCITON, LATICACIDVEN in the last 168 hours.  No results found for this or any previous visit (from the past 240 hour(s)).       Radiology  Studies: No results found.      Scheduled Meds: . Chlorhexidine Gluconate Cloth  6 each Topical Daily  . ferrous sulfate  325 mg Oral Q breakfast  . gabapentin  600 mg Oral BID  . insulin aspart  0-20 Units Subcutaneous TID WC  . mouth rinse  15 mL Mouth Rinse BID  . pantoprazole  40 mg Oral QHS  . potassium chloride  40 mEq Oral BID  . potassium chloride  40 mEq Oral Once  . pravastatin  20 mg Oral Daily  . rivaroxaban  20 mg Oral Q supper  . sildenafil  20 mg Oral TID  . sodium chloride flush  3 mL Intravenous Q12H  . spironolactone  12.5 mg Oral Daily  . torsemide  80 mg Oral BID  . vitamin B-12  500 mcg Oral Daily   Continuous Infusions: . sodium chloride Stopped (05/10/19 1103)  . sodium chloride    . treprostinil (REMODULIN) infusion 81 ng/kg/min (05/20/19 1200)     LOS: 19 days     Cordelia Poche, MD [facility] 05/21/2019, 9:48 AM  If 7PM-7AM, please contact night-coverage [email or web address]

## 2019-05-21 NOTE — Progress Notes (Signed)
Occupational Therapy Treatment Patient Details Name: [name] MRN: [id number] DOB: 04/26/1960 Today's Date: 05/21/2019    History of present illness 59 y/o female with h/o PAH on pulmonary vasodilators, peripheral neuropathy, rurinary incontinence admitted on 4/28 with severe sepsis in the setting of pneumonia and cellulitis.   OT comments  Pt pleasant and readily willing to walk to sink where she sat for shampoo cap and grooming activities. Pt requiring min assist to stand, min guard assist with RW to ambulate and set up to complete UB dressing and grooming. Continues to be appropriate for SNF level rehab.  Follow Up Recommendations  SNF;Supervision/Assistance - 24 hour    Equipment Recommendations       Recommendations for Other Services      Precautions / Restrictions Precautions Precautions: Fall Precaution Comments: watch O2       Mobility Bed Mobility               General bed mobility comments: pt received in chair  Transfers Overall transfer level: Needs assistance Equipment used: Rolling walker (2 wheeled) Transfers: Sit to/from Stand Sit to Stand: Min assist         General transfer comment: min assist to rise and steady, encouraged use of momentum    Balance Overall balance assessment: Needs assistance   Sitting balance-Leahy Scale: Good       Standing balance-Leahy Scale: Poor Standing balance comment: required RW                           ADL either performed or assessed with clinical judgement   ADL Overall ADL's : Needs assistance/impaired     Grooming: Brushing hair;Oral care;Sitting Grooming Details (indicate cue type and reason): at sink, used shampoo cap, combed hair and brushed teeth         Upper Body Dressing : Minimal assistance;Sitting                   Functional mobility during ADLs: Min guard;Rolling walker       Vision       Perception     Praxis      Cognition Arousal/Alertness:  Awake/alert Behavior During Therapy: WFL for tasks assessed/performed Overall Cognitive Status: History of cognitive impairments - at baseline                                          Exercises     Shoulder Instructions       General Comments      Pertinent Vitals/ Pain       Pain Assessment: No/denies pain  Home Living                                          Prior Functioning/Environment              Frequency  Min 2X/week        Progress Toward Goals  OT Goals(current goals can now be found in the care plan section)  Progress towards OT goals: Progressing toward goals  Acute Rehab OT Goals Patient Stated Goal: to get stronger OT Goal Formulation: With patient Potential to Achieve Goals: Good  Plan Discharge plan remains appropriate    Co-evaluation  AM-PAC OT "6 Clicks" Daily Activity     Outcome Measure   Help from another person eating meals?: None Help from another person taking care of personal grooming?: A Little Help from another person toileting, which includes using toliet, bedpan, or urinal?: A Lot Help from another person bathing (including washing, rinsing, drying)?: A Lot Help from another person to put on and taking off regular upper body clothing?: A Little Help from another person to put on and taking off regular lower body clothing?: A Lot 6 Click Score: 16    End of Session Equipment Utilized During Treatment: Oxygen  OT Visit Diagnosis: Other abnormalities of gait and mobility (R26.89);Unsteadiness on feet (R26.81);Pain;Muscle weakness (generalized) (M62.81)   Activity Tolerance Patient tolerated treatment well   Patient Left in chair;with call bell/phone within reach   Nurse Communication          Time: 7116-5790 OT Time Calculation (min): 18 min  Charges: OT General Charges $OT Visit: 1 Visit OT Treatments $Self Care/Home Management : 8-22 mins  Nestor Lewandowsky, OTR/L Acute Rehabilitation Services Pager: [phone] Office: [phone]   Malka So 05/21/2019, 3:53 PM

## 2019-05-22 ENCOUNTER — Other Ambulatory Visit: Payer: Self-pay

## 2019-05-22 ENCOUNTER — Encounter (HOSPITAL_COMMUNITY): Payer: Self-pay | Admitting: General Practice

## 2019-05-22 LAB — BASIC METABOLIC PANEL
Anion gap: 13 (ref 5–15)
BUN: 60 mg/dL — ABNORMAL HIGH (ref 6–20)
CO2: 35 mmol/L — ABNORMAL HIGH (ref 22–32)
Calcium: 9.1 mg/dL (ref 8.9–10.3)
Chloride: 86 mmol/L — ABNORMAL LOW (ref 98–111)
Creatinine, Ser: 1.38 mg/dL — ABNORMAL HIGH (ref 0.44–1.00)
GFR calc Af Amer: 49 mL/min — ABNORMAL LOW (ref >60)
GFR calc non Af Amer: 42 mL/min — ABNORMAL LOW (ref >60)
Glucose, Bld: 133 mg/dL — ABNORMAL HIGH (ref 70–99)
Potassium: 2.8 mmol/L — ABNORMAL LOW (ref 3.5–5.1)
Sodium: 134 mmol/L — ABNORMAL LOW (ref 135–145)

## 2019-05-22 LAB — GLUCOSE, CAPILLARY
Glucose-Capillary: 117 mg/dL — ABNORMAL HIGH (ref 70–99)
Glucose-Capillary: 132 mg/dL — ABNORMAL HIGH (ref 70–99)
Glucose-Capillary: 144 mg/dL — ABNORMAL HIGH (ref 70–99)
Glucose-Capillary: 145 mg/dL — ABNORMAL HIGH (ref 70–99)
Glucose-Capillary: 153 mg/dL — ABNORMAL HIGH (ref 70–99)

## 2019-05-22 LAB — POTASSIUM: Potassium: 3.2 mmol/L — ABNORMAL LOW (ref 3.5–5.1)

## 2019-05-22 MED ORDER — FLUTICASONE PROPIONATE 50 MCG/ACT NA SUSP
2.0000 | Freq: Every day | NASAL | Status: DC
  Administered 2019-05-22 – 2019-05-24 (×3): 2 via NASAL
  Filled 2019-05-22: qty 16

## 2019-05-22 MED ORDER — LORATADINE 10 MG PO TABS
10.0000 mg | ORAL_TABLET | Freq: Every day | ORAL | Status: DC
  Administered 2019-05-22 – 2019-05-24 (×3): 10 mg via ORAL
  Filled 2019-05-22 (×3): qty 1

## 2019-05-22 MED ORDER — POTASSIUM CHLORIDE CRYS ER 10 MEQ PO TBCR
40.0000 meq | EXTENDED_RELEASE_TABLET | Freq: Two times a day (BID) | ORAL | Status: DC
  Administered 2019-05-23: 40 meq via ORAL
  Filled 2019-05-22: qty 4

## 2019-05-22 MED ORDER — POTASSIUM CHLORIDE CRYS ER 20 MEQ PO TBCR
40.0000 meq | EXTENDED_RELEASE_TABLET | ORAL | Status: AC
  Administered 2019-05-22 (×3): 40 meq via ORAL
  Filled 2019-05-22 (×3): qty 2

## 2019-05-22 NOTE — Progress Notes (Signed)
Physical Therapy Treatment Patient Details Name: [name] MRN: [id number] DOB: 1960-10-19 Today's Date: 05/22/2019    History of Present Illness 59 y/o female with h/o PAH on pulmonary vasodilators, peripheral neuropathy, rurinary incontinence admitted on 4/28 with severe sepsis in the setting of pneumonia and cellulitis.    PT Comments    Pt demonstrating good progress.  Able to increase gait, on less O2 (5L) with stable O2 sats.  Required skilled therapy for vital monitoring and cues for transfer techniques.  Cont to progress as able.    Follow Up Recommendations  SNF;Supervision/Assistance - 24 hour     Equipment Recommendations  3in1 (PT);Rolling walker with 5" wheels    Recommendations for Other Services       Precautions / Restrictions Precautions Precautions: Fall Precaution Comments: watch O2    Mobility  Bed Mobility Overal bed mobility: Needs Assistance Bed Mobility: Supine to Sit     Supine to sit: Min assist     General bed mobility comments: Min A to lift trunk  Transfers Overall transfer level: Needs assistance Equipment used: Rolling walker (2 wheeled) Transfers: Sit to/from Stand Sit to Stand: Min guard         General transfer comment: sit to stand x 2; cues for safe hand placement  Ambulation/Gait Ambulation/Gait assistance: Min guard Gait Distance (Feet): 70 Feet(30' then 70') Assistive device: Rolling walker (2 wheeled) Gait Pattern/deviations: Step-through pattern;Wide base of support;Trunk flexed     General Gait Details: Cues for RW proximity and posture.  Pt was on 5 L O2 with O2 sats 95% rest, dropped to 93% walking. Required 1 sitting rest break   Stairs             Wheelchair Mobility    Modified Rankin (Stroke Patients Only)       Balance Overall balance assessment: Needs assistance Sitting-balance support: No upper extremity supported Sitting balance-Leahy Scale: Good     Standing balance support:  Single extremity supported;Bilateral upper extremity supported Standing balance-Leahy Scale: Fair Standing balance comment: required RW                            Cognition Arousal/Alertness: Awake/alert Behavior During Therapy: WFL for tasks assessed/performed Overall Cognitive Status: History of cognitive impairments - at baseline                                        Exercises      General Comments        Pertinent Vitals/Pain Pain Assessment: No/denies pain       Prior Function            PT Goals (current goals can now be found in the care plan section) Progress towards PT goals: Progressing toward goals    Frequency    Min 2X/week      PT Plan Current plan remains appropriate    Co-evaluation              AM-PAC PT "6 Clicks" Mobility   Outcome Measure  Help needed turning from your back to your side while in a flat bed without using bedrails?: A Little Help needed moving from lying on your back to sitting on the side of a flat bed without using bedrails?: A Little Help needed moving to and from a bed to a chair (including a wheelchair)?:  A Little Help needed standing up from a chair using your arms (e.g., wheelchair or bedside chair)?: A Little Help needed to walk in hospital room?: A Little Help needed climbing 3-5 steps with a railing? : A Lot 6 Click Score: 17    End of Session Equipment Utilized During Treatment: Gait belt;Oxygen Activity Tolerance: Patient tolerated treatment well Patient left: with call bell/phone within reach(On [facility] - knows to call for assist) Nurse Communication: Mobility status(pt on [facility]) PT Visit Diagnosis: Unsteadiness on feet (R26.81);Difficulty in walking, not elsewhere classified (R26.2)     Time: 8182-9937 PT Time Calculation (min) (ACUTE ONLY): 28 min  Charges:  $Gait Training: 8-22 mins $Therapeutic Activity: 8-22 mins                     Maggie Font, PT Acute Rehab  Services Pager [phone] [facility] [phone] [facility] [phone]    Karlton Lemon 05/22/2019, 4:44 PM

## 2019-05-22 NOTE — Progress Notes (Addendum)
Remodulin cartridge exchange via home CADD pump completed at 11:21 AM.   Was at bedside with Rapid Response RN Wilburn Cornelia for exchange.  RN clamped for exchange Patient did exchange RN unclamped upon connecting to patient's port.   Rate of 40 mL/hr 24 h was verified Volume of 99.7 mL (Pt primed twice piror to connecting due to concern it was not flowing - no issue found)  Plan: Exchange remodulin cartridge at 1100 AM  on Thursday 5/20  Sloan Leiter, PharmD, BCPS, BCCCP Clinical Pharmacist Please refer to [facility] for [place] numbers 05/22/2019 11:30 AM

## 2019-05-22 NOTE — Progress Notes (Signed)
PROGRESS NOTE    [name]  [id number]:[id number] DOB: Dec 04, 1960 DOA: 05/02/2019 PCP: [name], MD   Brief Narrative: [name] is a 59 y.o. female with a history of severe PAH on Remodulin and sildenafil, OSA, morbid obesity, diabetes type 2, CKD. Patient presented secondary to fever, cough, hypotension and worsening hypoxia with evidence of sepsis from cellulitis and pneumonia in addition to acute heart failure.   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 Patient is on baseline 4 L oxygen as an outpatient. Currently still requiring HFNC at 5 L -Wean to home 4 L as able  Septic shock Secondary to pneumonia/LE cellulitis and bacteremia. Patient required ICU admission and IV vasopressor support. Sepsis resolved. Patient completed antibiotic course.  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 Weight downtrended initially with diuresis.  -Cardiology recommendations: Continue diuresis (restarted on 5/12) with torsemide 80 mg BID. acetazolamide discontinued 5/14, continue Remodulin IV; Metolazone x1 given on 5/14  Hypotension Likely secondary to heavy diuresis. MAP is adequate. Improving with reduction in diuresis.  AKI Secondary to heavy diuresis. Improved slightly -Watch BMP with change in therapy back to torsemide; also on spironolactone  Hypokalemia In setting of  diuresis. On Spironolactone. Patient has been receiving scheduled potassium with torsemide dosing but potassium is significantly worse. Magnesium obtained on 5/17 is normal -Kdur 40 meq q4 hours x4 today with repeat potassium check at 6 pm (before 4th dose) -May need to increase to Kdur 60 meq BID vs 40 meq TID  Demand ischemia Initial concern for NSTEMI. Cardiology consulted performed left/right heart catheterization without evidence of significant obstructive disease.  Streptococcus bacteremia Completed antibiotic course.  Right lower extremity cellulitis Treated with vancomycin and cefazolin initially and transitioned to amoxacillin to complete antibiotic course.  Paroxysmal atrial fibrillation -Continue Xarelto  Allergies Mostly seasonal. -Claritin and Flonase  Non-sustained V-tach Watch electrolytes. Continue telemetry  Obesity Body mass index is 46.36 kg/m.   DVT prophylaxis: Xarelto Code Status:   Code Status: Partial Code Family Communication: None at bedside Disposition Plan: Discharge to SNF when able to wean to baseline oxygen in addition to availability of SNF bed; May need to discharge to SNF with slightly increased oxygen use as patient is stable otherwise. Needs potassium repleted prior to discharge. Discussed with CSW.   Consultants:   Cardiology  PCCM  Infectious disease  Procedures:   TRANSTHORACIC ECHOCARDIOGRAM (05/03/2019) IMPRESSIONS    1. Pulmonary pressures are much lower than on previous studies. Given the  reduced right ventricular systolic function, this may represent  progressive right ventricular dysfunction rather than improvement.  2. Left ventricular ejection fraction, by estimation, is 65 to 70%. The  left ventricle has normal function. The left ventricle has no regional  wall motion abnormalities. Left ventricular diastolic parameters are  consistent with Grade I diastolic  dysfunction (impaired relaxation).  3. Right  ventricular systolic function is moderately  reduced. The right  ventricular size is moderately enlarged. There is normal pulmonary artery  systolic pressure.  4. The mitral valve is normal in structure. No evidence of mitral valve  regurgitation. No evidence of mitral stenosis.  5. The aortic valve is tricuspid. Aortic valve regurgitation is not  visualized. No aortic stenosis is present.  6. The inferior vena cava is dilated in size with <50% respiratory  variability, suggesting right atrial pressure of 15 mmHg.    TUNNELED LINE (05/08/2019)  LEFT/RIGHT CARDIAC CATHETERIZATION (05/14/2019) Assessment: 1. 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 Normal LVEF 60-65% 3. Low volume status 4. Mild to moderate PAH (much improved with remodulin)   Plan/Discussion:  No evidence of significant CAD. Hemodynamically optimized on current regimen. Stop IV lasix.    Antimicrobials:  Vancomycin  Zosyn  Flagyl  Ceftriaxone  Cefepime  Azithromycin   Subjective: Allergy issues today. Confirms she uses Zyrtec and Flonase at home  Objective: Vitals:   05/22/19 0247 05/22/19 0348 05/22/19 0752 05/22/19 0900  BP: 101/63     Pulse: 87  79 85  Resp: 20  (!) 21 15  Temp: 97.8 F (36.6 C) 98 F (36.7 C)    TempSrc: Axillary Axillary    SpO2: 93%  98% 98%  Weight:      Height:        Intake/Output Summary (Last 24 hours) at 05/22/2019 1201 Last data filed at 05/21/2019 2200 Gross per 24 hour  Intake --  Output 400 ml  Net -400 ml   Filed Weights   05/17/19 0400 05/20/19 0338 05/21/19 0349  Weight: 128 kg 118.3 kg 118.7 kg    Examination:  General exam: Appears calm and comfortable Respiratory system: Clear to auscultation. Respiratory effort normal. Cardiovascular system: S1 & S2 heard, RRR. Gastrointestinal system: Abdomen is , obese, nondistended, soft and nontender. Normal bowel sounds heard. Central nervous system: Alert and oriented. No focal neurological  deficits. Musculoskeletal: BLE edema. No calf tenderness Skin: No cyanosis. Ecchymosis and stasis dermatatitis noted Psychiatry: Judgement and insight appear normal. Mood & affect appropriate.    Data Reviewed: I have personally reviewed following labs and imaging studies  CBC: Recent Labs  Lab 05/16/19 0352 05/17/19 0412  WBC 10.4 8.9  HGB 12.4 11.7*  HCT 39.3 37.6  MCV 87.3 86.6  PLT 314 127   Basic Metabolic Panel: Recent Labs  Lab 05/17/19 0412 05/18/19 0456 05/20/19 0623 05/21/19 0830 05/22/19 0331  NA 138 138 134* 132* 134*  K 3.1* 3.4* 3.2* 2.6* 2.8*  CL 102 101 90* 84* 86*  CO2 28 29 32 34* 35*  GLUCOSE 127* 123* 139* 140* 133*  BUN 15 18 41* 50* 60*  CREATININE 0.89 0.83 1.48* 1.30* 1.38*  CALCIUM 8.4* 8.6* 9.0 8.7* 9.1  MG  --   --   --  2.0  --    GFR: Estimated Creatinine Clearance: 56 mL/min (A) (by C-G formula based on SCr of 1.38 mg/dL (H)). Liver Function Tests: No results for input(s): AST, ALT, ALKPHOS, BILITOT, PROT, ALBUMIN in the last 168 hours. No results for input(s): LIPASE, AMYLASE in the last 168 hours. No results for input(s): AMMONIA in the last 168 hours. Coagulation Profile: No results for input(s): INR, PROTIME in the last 168 hours. Cardiac Enzymes: No results for input(s): CKTOTAL, CKMB, CKMBINDEX, TROPONINI in the last 168 hours. BNP (last 3 results) No results for input(s): PROBNP in the last 8760 hours. HbA1C: No results for input(s): HGBA1C in the last 72  hours. CBG: Recent Labs  Lab 05/21/19 1144 05/21/19 1532 05/21/19 2006 05/21/19 2319 05/22/19 0825  GLUCAP 212* 137* 145* 161* 145*   Lipid Profile: No results for input(s): CHOL, HDL, LDLCALC, TRIG, CHOLHDL, LDLDIRECT in the last 72 hours. Thyroid Function Tests: No results for input(s): TSH, T4TOTAL, FREET4, T3FREE, THYROIDAB in the last 72 hours. Anemia Panel: No results for input(s): VITAMINB12, FOLATE, FERRITIN, TIBC, IRON, RETICCTPCT in the last 72  hours. Sepsis Labs: No results for input(s): PROCALCITON, LATICACIDVEN in the last 168 hours.  No results found for this or any previous visit (from the past 240 hour(s)).       Radiology Studies: No results found.      Scheduled Meds: . Chlorhexidine Gluconate Cloth  6 each Topical Daily  . ferrous sulfate  325 mg Oral Q breakfast  . gabapentin  600 mg Oral BID  . insulin aspart  0-20 Units Subcutaneous TID WC  . mouth rinse  15 mL Mouth Rinse BID  . pantoprazole  40 mg Oral QHS  . [START ON 05/23/2019] potassium chloride  40 mEq Oral BID  . potassium chloride  40 mEq Oral Q4H  . pravastatin  20 mg Oral Daily  . rivaroxaban  20 mg Oral Q supper  . sildenafil  20 mg Oral TID  . sodium chloride flush  3 mL Intravenous Q12H  . spironolactone  12.5 mg Oral Daily  . torsemide  80 mg Oral BID  . vitamin B-12  500 mcg Oral Daily   Continuous Infusions: . sodium chloride Stopped (05/10/19 1103)  . sodium chloride    . treprostinil (REMODULIN) infusion 81 ng/kg/min (05/22/19 1121)     LOS: 20 days     Cordelia Poche, MD [facility] 05/22/2019, 12:01 PM  If 7PM-7AM, please contact night-coverage [email or web address]

## 2019-05-22 NOTE — TOC Progression Note (Signed)
Transition of Care [facility]) - Progression Note    Patient Details  Name: [name] MRN: [id number] Date of Birth: 1960/01/27  Transition of Care [facility]) CM/SW [place], [place] Phone Number: 05/22/2019, 4:30 PM  Clinical Narrative:     Olivia Mackie from [facility] confirmed that they could take pt. CSW followed up with son and pt. Confirmed they are okay with [facility]. Olivia Mackie from [place] notified of potential dc tomorrow.   Expected Discharge Plan: [place] Barriers to Discharge: Continued Medical Work up  Expected Discharge Plan and Services Expected Discharge Plan: [place] arrangements for the past 2 months: Single Family Home                                       Social Determinants of Health (SDOH) Interventions    Readmission Risk Interventions No flowsheet data found.

## 2019-05-23 LAB — BASIC METABOLIC PANEL
Anion gap: 12 (ref 5–15)
BUN: 53 mg/dL — ABNORMAL HIGH (ref 6–20)
CO2: 33 mmol/L — ABNORMAL HIGH (ref 22–32)
Calcium: 9 mg/dL (ref 8.9–10.3)
Chloride: 89 mmol/L — ABNORMAL LOW (ref 98–111)
Creatinine, Ser: 1.09 mg/dL — ABNORMAL HIGH (ref 0.44–1.00)
GFR calc Af Amer: >60 mL/min (ref >60)
GFR calc non Af Amer: 56 mL/min — ABNORMAL LOW (ref >60)
Glucose, Bld: 130 mg/dL — ABNORMAL HIGH (ref 70–99)
Potassium: 3.3 mmol/L — ABNORMAL LOW (ref 3.5–5.1)
Sodium: 134 mmol/L — ABNORMAL LOW (ref 135–145)

## 2019-05-23 LAB — CBC
HCT: 39.5 % (ref 36.0–46.0)
Hemoglobin: 12.9 g/dL (ref 12.0–15.0)
MCH: 27.3 pg (ref 26.0–34.0)
MCHC: 32.7 g/dL (ref 30.0–36.0)
MCV: 83.5 fL (ref 80.0–100.0)
Platelets: 498 10*3/uL — ABNORMAL HIGH (ref 150–400)
RBC: 4.73 MIL/uL (ref 3.87–5.11)
RDW: 17 % — ABNORMAL HIGH (ref 11.5–15.5)
WBC: 5.4 10*3/uL (ref 4.0–10.5)
nRBC: 0 % (ref 0.0–0.2)

## 2019-05-23 LAB — GLUCOSE, CAPILLARY
Glucose-Capillary: 122 mg/dL — ABNORMAL HIGH (ref 70–99)
Glucose-Capillary: 126 mg/dL — ABNORMAL HIGH (ref 70–99)
Glucose-Capillary: 154 mg/dL — ABNORMAL HIGH (ref 70–99)
Glucose-Capillary: 141 mg/dL — ABNORMAL HIGH (ref 70–99)
Glucose-Capillary: 164 mg/dL — ABNORMAL HIGH (ref 70–99)
Glucose-Capillary: 135 mg/dL — ABNORMAL HIGH (ref 70–99)

## 2019-05-23 MED ORDER — POTASSIUM CHLORIDE CRYS ER 20 MEQ PO TBCR
40.0000 meq | EXTENDED_RELEASE_TABLET | Freq: Three times a day (TID) | ORAL | Status: DC
  Administered 2019-05-23 – 2019-05-24 (×3): 40 meq via ORAL
  Filled 2019-05-23 (×3): qty 2

## 2019-05-23 MED ORDER — POLYETHYLENE GLYCOL 3350 17 G PO PACK: 17.0000 g | PACK | Freq: Every day | ORAL | 0 refills | Status: DC | PRN

## 2019-05-23 MED ORDER — POTASSIUM CHLORIDE CRYS ER 20 MEQ PO TBCR: 40.0000 meq | EXTENDED_RELEASE_TABLET | Freq: Three times a day (TID) | ORAL | Status: DC

## 2019-05-23 MED ORDER — DOCUSATE SODIUM 100 MG PO CAPS: 100.0000 mg | ORAL_CAPSULE | Freq: Two times a day (BID) | ORAL | 0 refills | Status: DC | PRN

## 2019-05-23 MED ORDER — TORSEMIDE 20 MG PO TABS: 80.0000 mg | ORAL_TABLET | Freq: Two times a day (BID) | ORAL | Status: DC

## 2019-05-23 MED ORDER — GABAPENTIN 300 MG PO CAPS: 600.0000 mg | ORAL_CAPSULE | Freq: Two times a day (BID) | ORAL | Status: DC

## 2019-05-23 NOTE — Plan of Care (Signed)
  Problem: Clinical Measurements: Goal: Ability to avoid or minimize complications of infection will improve Outcome: Progressing

## 2019-05-23 NOTE — Progress Notes (Signed)
Occupational Therapy Treatment Patient Details Name: [name] MRN: [id number] DOB: March 23, 1960 Today's Date: 05/23/2019    History of present illness 59 y/o female with h/o PAH on pulmonary vasodilators, peripheral neuropathy, rurinary incontinence admitted on 4/28 with severe sepsis in the setting of pneumonia and cellulitis.   OT comments  Pt making steady progress towards OT goals this session. Pt continues to present with generalized weakness, decreased activity tolerance impacting pts ability to complete BADLs. Overall, pt requires MIN A for bed mobility and min guard for functional mobility with RW. Pt requires total A for LB ADLs and supervision for standing grooming tasks at sink. Pt on 6L HFNC at start of session but required increase to 8L during mobility with O2 remaining > 89%. DC plan remains appropriate, will follow acutely per POC.    Follow Up Recommendations  SNF;Supervision/Assistance - 24 hour    Equipment Recommendations       Recommendations for Other Services      Precautions / Restrictions Precautions Precautions: Fall Precaution Comments: watch O2 Restrictions Weight Bearing Restrictions: No       Mobility Bed Mobility Overal bed mobility: Needs Assistance Bed Mobility: Supine to Sit;Sit to Supine     Supine to sit: HOB elevated;Min assist Sit to supine: HOB elevated;Min assist   General bed mobility comments: MIN A to manage lines;pt able to assist with sliding pt up in bed  Transfers Overall transfer level: Needs assistance Equipment used: Rolling walker (2 wheeled) Transfers: Sit to/from Stand Sit to Stand: Min guard         General transfer comment: cues for hand placement; minguard for pt to initially obtain standing balance    Balance Overall balance assessment: Needs assistance Sitting-balance support: No upper extremity supported Sitting balance-Leahy Scale: Good     Standing balance support: During functional activity;No  upper extremity supported;Single extremity supported Standing balance-Leahy Scale: Fair Standing balance comment: at least one UE supported during standing grooming tasks                           ADL either performed or assessed with clinical judgement   ADL Overall ADL's : Needs assistance/impaired     Grooming: Wash/dry face;Oral care;Standing;Supervision/safety           Upper Body Dressing : Minimal assistance;Sitting Upper Body Dressing Details (indicate cue type and reason): to don new hospital gown Lower Body Dressing: Total assistance;Bed level Lower Body Dressing Details (indicate cue type and reason): total A to don socks EOB; pt reports she doesn't wear socks at baseline Toilet Transfer: Ambulation;RW;Min guard [employer] forces technical officer Details (indicate cue type and reason): simulated via functional mobility       Tub/Shower Transfer Details (indicate cue type and reason): education provided on TTB for home when appropriate; pt had been stepping over tub and standing in shower Functional mobility during ADLs: Min guard;Rolling walker General ADL Comments: pt motivated to participate this session; pt continues to present with generalized weakness and decreased activity tolerance impacting pts ability to engage in [employer]       Perception     Praxis      Cognition Arousal/Alertness: Awake/alert Behavior During Therapy: [facility] for tasks assessed/performed Overall Cognitive Status: History of cognitive impairments - at baseline  Exercises     Shoulder Instructions       General Comments pt on 6L HFNC upon arrival; pt required increase to 8Lduring mobility with sats >89%. pt RRmax 33    Pertinent Vitals/ Pain       Pain Assessment: No/denies pain  Home Living                                          Prior Functioning/Environment              Frequency  Min 2X/week         Progress Toward Goals  OT Goals(current goals can now be found in the care plan section)  Progress towards OT goals: Progressing toward goals  Acute Rehab OT Goals Patient Stated Goal: to get stronger OT Goal Formulation: With patient Potential to Achieve Goals: Good  Plan Discharge plan remains appropriate    Co-evaluation                 AM-PAC OT "6 Clicks" Daily Activity     Outcome Measure   Help from another person eating meals?: None Help from another person taking care of personal grooming?: A Little Help from another person toileting, which includes using toliet, bedpan, or urinal?: A Lot Help from another person bathing (including washing, rinsing, drying)?: A Lot Help from another person to put on and taking off regular upper body clothing?: A Little Help from another person to put on and taking off regular lower body clothing?: A Lot 6 Click Score: 16    End of Session Equipment Utilized During Treatment: Gait belt;Rolling walker;Oxygen;Other (comment)(6-8L HFNC)  OT Visit Diagnosis: Other abnormalities of gait and mobility (R26.89);Unsteadiness on feet (R26.81);Pain;Muscle weakness (generalized) (M62.81)   Activity Tolerance Patient tolerated treatment well   Patient Left in bed;with call bell/phone within reach;with bed alarm set   Nurse Communication Mobility status        Time: 7902-4097 OT Time Calculation (min): 38 min  Charges: OT General Charges $OT Visit: 1 Visit OT Treatments $Self Care/Home Management : 38-52 mins  Lanier Clam., COTA/L Acute Rehabilitation Services [phone] [place] 05/23/2019, 4:57 PM

## 2019-05-23 NOTE — Discharge Summary (Signed)
Physician Discharge Summary  [name] CHE:[id number] DOB: [birth year] DOA: 05/02/2019  PCP: [name], MD  Admit date: 05/02/2019 Discharge date: 05/24/2019  Admitted From: [facility].  Disposition:  SNF  Recommendations for Outpatient Follow-up:  1. Follow up with PCP in 1-2 weeks 2. Please obtain BMP/CBC in one week Please follow up with cardiology as recommended  Discharge Condition stable.  CODE STATUS: partial code.  Diet recommendation: Heart Healthy   Brief/Interim Summary: [name] is a 59 y.o. female with a history of severe PAH on Remodulin and sildenafil, OSA, morbid obesity, diabetes type 2, CKD. Patient presented secondary to fever, cough, hypotension and worsening hypoxia with evidence of sepsis from cellulitis and pneumonia in addition to acute heart failure.   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 Patient is on baseline 4 L oxygen as an outpatient. Currently still requiring HFNC at 5 L -Wean to home 4 L as able  Septic shock Secondary to pneumonia/LE cellulitis and bacteremia. Patient required ICU admission and IV vasopressor support. Sepsis resolved. Patient completed antibiotic course.  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 Weight downtrended initially with diuresis.  -Cardiology recommendations: Continue diuresis (restarted on 5/12)  with torsemide 80 mg BID. acetazolamide discontinued 5/14, continue Remodulin IV; Metolazone x1 given on 5/14  Hypotension Likely secondary to heavy diuresis. MAP is adequate. Improving with reduction in diuresis.  AKI Secondary to heavy diuresis. Improved slightly -Watch BMP with change in therapy back to torsemide; also on spironolactone  Hypokalemia In setting of diuresis. On Spironolactone. Patient has been receiving scheduled potassium with torsemide dosing but potassium is significantly worse. Magnesium obtained on 5/17 is normal -Kdur 40 meq q4 hours x4 today with repeat potassium check at 6 pm (before 4th dose) -May need to increase to Kdur 60 meq BID vs 40 meq TID  Demand ischemia Initial concern for NSTEMI. Cardiology consulted performed left/right heart catheterization without evidence of significant obstructive disease.  Streptococcus bacteremia Completed antibiotic course.  Right lower extremity cellulitis Treated with vancomycin and cefazolin initially and transitioned to amoxacillin to complete antibiotic course.  Paroxysmal atrial fibrillation -Continue Xarelto  Allergies Mostly seasonal. -Claritin and Flonase  Non-sustained V-tach Watch electrolytes. Continue telemetry  Obesity Body mass index is 46.36 kg/m.   Discharge Instructions   Allergies as of 05/23/2019      Reactions   Aspirin    REACTION: airway swelling   Codeine    REACTION: tingling in lips and hard breathing - had reaction at dentist - states "I can't take certain kinds of codeine" - happened maybe 10 yr ago   Lisinopril    Cough   Sulfonamide Derivatives    REACTION: airway swelling   Latex Rash      Medication List    STOP taking these medications   cetirizine 10 MG tablet Commonly known as: ZYRTEC   cyclobenzaprine 5 MG tablet Commonly known as:  FLEXERIL   diclofenac sodium 1 % Gel Commonly known as: VOLTAREN   fluconazole 150 MG tablet Commonly known as:  Diflucan   mupirocin ointment 2 % Commonly known as: BACTROBAN   nystatin cream Commonly known as: MYCOSTATIN   sodium chloride 0.65 % Soln nasal spray Commonly known as: OCEAN     TAKE these medications   Accu-Chek Aviva Plus test strip Generic drug: glucose blood USE 1 STRIP TO CHECK GLUCOSE ONCE DAILY   acetaminophen 500 MG tablet Commonly known as: TYLENOL Take 1 tablet (500 mg total) by mouth every 6 (six) hours as needed. What changed: reasons to take this   docusate sodium 100 MG capsule Commonly known as: COLACE Take 1 capsule (100 mg total) by mouth 2 (two) times daily as needed for mild constipation.   ferrous sulfate 325 (65 FE) MG tablet Take 1 tablet (325 mg total) by mouth daily with breakfast.   fluticasone 50 MCG/ACT nasal spray Commonly known as: Flonase Place 1 spray into both nostrils daily.   gabapentin 300 MG capsule Commonly known as: NEURONTIN Take 2 capsules (600 mg total) by mouth 2 (two) times daily. What changed: when to take this   metFORMIN 500 MG tablet Commonly known as: GLUCOPHAGE TAKE 1/2 (ONE-HALF) TABLET BY MOUTH WITH BREAKFAST What changed:   how much to take  how to take this  when to take this  additional instructions   pantoprazole 20 MG tablet Commonly known as: PROTONIX Take 1 tablet (20 mg total) by mouth 2 (two) times daily.   polyethylene glycol 17 g packet Commonly known as: MIRALAX / GLYCOLAX Take 17 g by mouth daily as needed for moderate constipation.   potassium chloride SA 20 MEQ tablet Commonly known as: KLOR-CON Take 2 tablets (40 mEq total) by mouth 3 (three) times daily. What changed: when to take this   pravastatin 20 MG tablet Commonly known as: PRAVACHOL Take 1 tablet by mouth once daily   sildenafil 20 MG tablet Commonly known as: REVATIO Take 1 tablet (20 mg total) by mouth 3 (three) times daily. What changed:   how much to take  when to take this   spironolactone 25 MG  tablet Commonly known as: ALDACTONE Take 1/2 (one-half) tablet by mouth once daily   torsemide 20 MG tablet Commonly known as: DEMADEX Take 4 tablets (80 mg total) by mouth 2 (two) times daily. What changed: Another medication with the same name was removed. Continue taking this medication, and follow the directions you see here.   treprostinil 5 MG/ML Soln injection Commonly known as: REMODULIN Inject 0.667 ng/kg/min into the skin continuous.   vitamin B-12 500 MCG tablet Commonly known as: CYANOCOBALAMIN Take 500 mcg by mouth daily.   Xarelto 20 MG Tabs tablet Generic drug: rivaroxaban TAKE 1 TABLET BY MOUTH ONCE DAILY WITH SUPPER What changed: See the new instructions.      Follow-up Information    Rigoberto Noel, MD. Go on 06/19/2019.   Specialty: Pulmonary Disease Why: at 1130 am.  Please call our office if you need to reschedule.  Contact information: [address] [address] [phone]          Allergies  Allergen Reactions  . Aspirin     REACTION: airway swelling  . Codeine     REACTION: tingling in lips and hard breathing - had reaction at dentist - states "I can't take certain kinds of codeine" - happened maybe 10 yr ago  .  Lisinopril     Cough  . Sulfonamide Derivatives     REACTION: airway swelling  . Latex Rash    Consultations:  Cardiology   Procedures/Studies: CARDIAC CATHETERIZATION  Result Date: 05/14/2019  Mid LAD lesion is 30% stenosed.  Findings: Ao = 94/64 (78) LV = 86/11 RA =  6 RV = 69/4 PA = 63/17 (37) PCW = 7 Fick cardiac output/index = 6.0/2.8 PVR = 5.0 WU Ao sat = 96% PA sat = 66%, 68% Assessment: 1. 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 Normal LVEF 60-65% 3. Low volume status 4. Mild to moderate PAH (much improved with remodulin) Plan/Discussion: No evidence of significant CAD. Hemodynamically optimized on current regimen. Stop IV lasix. Glori Bickers, MD 2:50 PM   CT Chest High Resolution  Result  Date: 05/10/2019 CLINICAL DATA:  Inpatient. Hypoxemia. EXAM: CT CHEST WITHOUT CONTRAST TECHNIQUE: Multidetector CT imaging of the chest was performed following the standard protocol without intravenous contrast. High resolution imaging of the lungs, as well as inspiratory and expiratory imaging, was performed. COMPARISON:  05/04/2019 chest radiograph. 06/24/2008 chest CT. FINDINGS: Markedly motion degraded scan, significantly limiting assessment. Cardiovascular: Moderate cardiomegaly. Small to moderate pericardial effusion is stable. Left anterior descending and right coronary atherosclerosis. Right internal jugular central venous catheter terminates in the middle third of the SVC. Left internal jugular central venous catheter terminates in the middle third of the SVC. Atherosclerotic nonaneurysmal thoracic aorta. Stable dilated main pulmonary artery (5.0 cm diameter). Mediastinum/Nodes: No discrete thyroid nodules. Unremarkable esophagus. No axillary adenopathy. Stable enlarged 1.8 cm lateral right prevascular node (series 5/image 48). No new pathologically enlarged mediastinal nodes. No discrete hilar adenopathy on these noncontrast images. Lungs/Pleura: No pneumothorax. Small right and trace left dependent pleural effusions. Dense consolidation with air bronchograms and volume loss in the dependent lower lobes bilaterally. Mild patchy ground-glass opacity in the peribronchovascular lungs bilaterally. No significant regions of bronchiectasis or frank honeycombing. No discrete lung masses or significant pulmonary nodules on these limited images. No evidence of significant air trapping or tracheobronchomalacia. Upper abdomen: Finely irregular liver surface, compatible with cirrhosis. Trace perihepatic ascites. Musculoskeletal: No aggressive appearing focal osseous lesions. Moderate thoracic spondylosis. IMPRESSION: 1. Markedly motion degraded scan, significantly limiting assessment. No findings to suggest underlying  interstitial lung disease. 2. Dense consolidation with air bronchograms and volume loss in the dependent lower lobes bilaterally. Favor atelectasis, with a component of aspiration or pneumonia not excluded. 3. Small right and trace left dependent pleural effusions. 4. Moderate cardiomegaly. Chronic small to moderate pericardial effusion. Mild patchy ground-glass opacities in a peribronchovascular lungs, which may indicate mild pulmonary edema. 5. Stable dilated main pulmonary artery, suggesting chronic pulmonary arterial hypertension. 6. Cirrhosis. Trace perihepatic ascites. 7. Stable mild mediastinal lymphadenopathy, nonspecific, probably reactive. 8. Two-vessel coronary atherosclerosis. 9. Aortic Atherosclerosis (ICD10-I70.0). Electronically Signed   By: Ilona Sorrel M.D.   On: 05/10/2019 15:38   IR Fluoro Guide CV Line Right  Result Date: 05/09/2019 CLINICAL DATA:  Pulmonary arterial hypertension, needs durable venous access EXAM: TUNNELED CENTRAL VENOUS CATHETER PLACEMENT WITH ULTRASOUND AND FLUOROSCOPIC GUIDANCE TECHNIQUE: The procedure, risks, benefits, and alternatives were explained to the patient. Questions regarding the procedure were encouraged and answered. The patient understands and consents to the procedure. Patency of the right IJ vein was confirmed with ultrasound with image documentation. An appropriate skin site was determined. Region was prepped using maximum barrier technique including cap and mask, sterile gown, sterile gloves, large sterile sheet, and Chlorhexidine as cutaneous antisepsis. The region was infiltrated  locally with 1% lidocaine. Under real-time ultrasound guidance, the right IJ vein was accessed with a 21 gauge micropuncture needle; the needle tip within the vein was confirmed with ultrasound image documentation. 80F dual-lumen cuffed powerPICC tunneled from a right anterior chest wall approach to the dermatotomy site. Needle exchanged over the 018 guidewire for transitional  dilator, through which the catheter which had been cut to 20 cm was advanced under intermittent fluoroscopy, positioned with its tip in the mid SVC. Spot chest radiograph confirms good catheter position. No pneumothorax. Catheter was flushed per protocol. Catheter secured externally with O Prolene suture. The right IJ dermatotomy site was closed with Dermabond. COMPLICATIONS: COMPLICATIONS None immediate FLUOROSCOPY TIME:  0.4 minute; 44 uGym2 DAP COMPARISON:  None IMPRESSION: 1. Technically successful placement of tunneled right IJ tunneled dual-lumen power injectable catheter with ultrasound and fluoroscopic guidance. Ready for routine use. Electronically Signed   By: Lucrezia Europe M.D.   On: 05/09/2019 12:28   IR Removal Tun Cv Cath W/O FL  Result Date: 05/09/2019 INDICATION: Request for removal of tunneled central venous catheter secondary to bacteremia. EXAM: REMOVAL OF TUNNELED CENTRAL VENOUS CATHETER MEDICATIONS: None COMPLICATIONS: None immediate. PROCEDURE: Informed written consent was obtained from the patient following an explanation of the procedure, risks, benefits and alternatives to treatment. A time out was performed prior to the initiation of the procedure. Maximal barrier sterile technique was utilized including caps, mask, gloves, and hand hygiene. Utilizing only gentle traction, the catheter was removed intact. Hemostasis was obtained with manual compression. A dressing was placed. The patient tolerated the procedure well without immediate post procedural complication. IMPRESSION: Successful removal of tunneled central venous catheter. Read by: Gareth Eagle, PA-C Electronically Signed   By: Lucrezia Europe M.D.   On: 05/08/2019 16:53   IR US Guide Vasc Access Right  Result Date: 05/09/2019 CLINICAL DATA:  Pulmonary arterial hypertension, needs durable venous access EXAM: TUNNELED CENTRAL VENOUS CATHETER PLACEMENT WITH ULTRASOUND AND FLUOROSCOPIC GUIDANCE TECHNIQUE: The procedure, risks, benefits,  and alternatives were explained to the patient. Questions regarding the procedure were encouraged and answered. The patient understands and consents to the procedure. Patency of the right IJ vein was confirmed with ultrasound with image documentation. An appropriate skin site was determined. Region was prepped using maximum barrier technique including cap and mask, sterile gown, sterile gloves, large sterile sheet, and Chlorhexidine as cutaneous antisepsis. The region was infiltrated locally with 1% lidocaine. Under real-time ultrasound guidance, the right IJ vein was accessed with a 21 gauge micropuncture needle; the needle tip within the vein was confirmed with ultrasound image documentation. 80F dual-lumen cuffed powerPICC tunneled from a right anterior chest wall approach to the dermatotomy site. Needle exchanged over the 018 guidewire for transitional dilator, through which the catheter which had been cut to 20 cm was advanced under intermittent fluoroscopy, positioned with its tip in the mid SVC. Spot chest radiograph confirms good catheter position. No pneumothorax. Catheter was flushed per protocol. Catheter secured externally with O Prolene suture. The right IJ dermatotomy site was closed with Dermabond. COMPLICATIONS: COMPLICATIONS None immediate FLUOROSCOPY TIME:  0.4 minute; 44 uGym2 DAP COMPARISON:  None IMPRESSION: 1. Technically successful placement of tunneled right IJ tunneled dual-lumen power injectable catheter with ultrasound and fluoroscopic guidance. Ready for routine use. Electronically Signed   By: Lucrezia Europe M.D.   On: 05/09/2019 12:28   DG CHEST PORT 1 VIEW  Result Date: 05/04/2019 CLINICAL DATA:  Status post central line placement EXAM: PORTABLE CHEST 1 VIEW COMPARISON:  05/03/2019 FINDINGS: Cardiac shadow is enlarged but stable. Left jugular central line is noted in the proximal superior vena cava. Right-sided Hickman is noted in the superior vena cava as well. No pneumothorax is  noted. Mild central vascular congestion is noted similar to that seen on the prior exam. No sizable effusion is noted. No bony abnormality is seen. IMPRESSION: No pneumothorax following central line placement. Catheter tip is noted in the proximal superior vena cava. Central vascular congestion is again seen with cardiomegaly. Electronically Signed   By: Inez Catalina M.D.   On: 05/04/2019 23:46   DG CHEST PORT 1 VIEW  Result Date: 05/03/2019 CLINICAL DATA:  BiPAP counseling EXAM: PORTABLE CHEST 1 VIEW COMPARISON:  05/03/2019, 05/02/2019, 06/25/2018 FINDINGS: Bilateral central venous catheter tips overlie the upper SVC. Stable enlarged cardiomediastinal silhouette with vascular congestion. Probable small left effusion. Generous central pulmonary vessels suggesting arterial hypertension. No pneumothorax. IMPRESSION: Cardiomegaly with vascular congestion and probable small left effusion. Enlarged central pulmonary vessels suggesting arterial hypertension. Overall no significant change since prior radiograph Electronically Signed   By: Donavan Foil M.D.   On: 05/03/2019 19:33   DG CHEST PORT 1 VIEW  Result Date: 05/03/2019 CLINICAL DATA:  60 year old female status post central line placement. EXAM: PORTABLE CHEST 1 VIEW COMPARISON:  Chest x-ray 05/03/2019. FINDINGS: There is a left-sided internal jugular central venous catheter with tip terminating in the distal superior vena cava. There is a right-sided internal jugular central venous catheter with tip terminating in the distal superior vena cava. Lung volumes are normal. There is cephalization of the pulmonary vasculature and slight indistinctness of the interstitial markings suggestive of mild pulmonary edema. Central pulmonary arteries are markedly dilated. No definite pleural effusions. No pneumothorax. Mild cardiomegaly. Upper mediastinal contours are within normal limits. IMPRESSION: 1. Support apparatus, as above. 2. The appearance the chest suggests  mild congestive heart failure. 3. Dilated central pulmonary arteries again noted, suggesting pulmonary arterial hypertension. Electronically Signed   By: Vinnie Langton M.D.   On: 05/03/2019 14:39   DG Chest Port 1 View  Result Date: 05/03/2019 CLINICAL DATA:  Respiratory failure. EXAM: PORTABLE CHEST 1 VIEW COMPARISON:  05/02/2019 FINDINGS: Stable cardiomegaly. Right-sided central venous catheter remains in appropriate position. Stable enlarged central pulmonary arteries, consistent with pulmonary arterial hypertension both lungs are clear. IMPRESSION: Stable cardiomegaly, and enlarged central pulmonary arteries consistent with pulmonary arterial hypertension. No active lung disease. Electronically Signed   By: Marlaine Hind M.D.   On: 05/03/2019 08:28   DG Chest Portable 1 View  Result Date: 05/02/2019 CLINICAL DATA:  Shortness of breath EXAM: PORTABLE CHEST 1 VIEW COMPARISON:  06/19/2018 FINDINGS: Right-sided central venous catheter tip over the cavoatrial region. Cardiomegaly with central vascular congestion. Enlarged central pulmonary vessels suggesting arterial hypertension. No consolidation or effusion. No pneumothorax. IMPRESSION: 1. Cardiomegaly with central vascular congestion 2. Enlarged central pulmonary vessels suggesting arterial hypertension Electronically Signed   By: Donavan Foil M.D.   On: 05/02/2019 20:51   ECHOCARDIOGRAM COMPLETE  Result Date: 05/03/2019    ECHOCARDIOGRAM REPORT   Patient Name:   TORRE PIKUS Date of Exam: 05/03/2019 Medical Rec #:  [id number]       Height:       63.5 in Accession #:    [id number]      Weight:       296.3 lb Date of Birth:  1960/02/15       BSA:          2.299 m Patient Age:  58 years        BP:           112/66 mmHg Patient Gender: F               HR:           106 bpm. Exam Location:  Inpatient Procedure: 2D Echo, Cardiac Doppler, Color Doppler and Intracardiac            Opacification Agent Indications:    Respiratory Failure with Hypoxia  [id number]  History:        Patient has prior history of Echocardiogram examinations, most                 recent 11/07/2014. CHF, Pulmonary HTN and COPD,                 Signs/Symptoms:Dyspnea; Risk Factors:Hypertension, Diabetes,                 Dyslipidemia, Sleep Apnea and GERD.  Sonographer:    Jonelle Sidle Dance Referring Phys: [id number] RAHUL P DESAI IMPRESSIONS  1. Pulmonary pressures are much lower than on previous studies. Given the reduced right ventricular systolic function, this may represent progressive right ventricular dysfunction rather than improvement.  2. Left ventricular ejection fraction, by estimation, is 65 to 70%. The left ventricle has normal function. The left ventricle has no regional wall motion abnormalities. Left ventricular diastolic parameters are consistent with Grade I diastolic dysfunction (impaired relaxation).  3. Right ventricular systolic function is moderately reduced. The right ventricular size is moderately enlarged. There is normal pulmonary artery systolic pressure.  4. The mitral valve is normal in structure. No evidence of mitral valve regurgitation. No evidence of mitral stenosis.  5. The aortic valve is tricuspid. Aortic valve regurgitation is not visualized. No aortic stenosis is present.  6. The inferior vena cava is dilated in size with <50% respiratory variability, suggesting right atrial pressure of 15 mmHg. FINDINGS  Left Ventricle: Left ventricular ejection fraction, by estimation, is 65 to 70%. The left ventricle has normal function. The left ventricle has no regional wall motion abnormalities. Definity contrast agent was given IV to delineate the left ventricular  endocardial borders. The left ventricular internal cavity size was normal in size. There is no left ventricular hypertrophy. Left ventricular diastolic parameters are consistent with Grade I diastolic dysfunction (impaired relaxation). Right Ventricle: The right ventricular size is moderately enlarged. No  increase in right ventricular wall thickness. Right ventricular systolic function is moderately reduced. There is normal pulmonary artery systolic pressure. The tricuspid regurgitant velocity is 2.20 m/s, and with an assumed right atrial pressure of 15 mmHg, the estimated right ventricular systolic pressure is 74.2 mmHg. Left Atrium: Left atrial size was normal in size. Right Atrium: Right atrial size was normal in size. Pericardium: There is no evidence of pericardial effusion. Mitral Valve: The mitral valve is normal in structure. Normal mobility of the mitral valve leaflets. No evidence of mitral valve regurgitation. No evidence of mitral valve stenosis. Tricuspid Valve: The tricuspid valve is normal in structure. Tricuspid valve regurgitation is trivial. No evidence of tricuspid stenosis. Aortic Valve: The aortic valve is tricuspid. Aortic valve regurgitation is not visualized. No aortic stenosis is present. Pulmonic Valve: The pulmonic valve was normal in structure. Pulmonic valve regurgitation is not visualized. No evidence of pulmonic stenosis. Aorta: The aortic root is normal in size and structure. Venous: The inferior vena cava is dilated in size with less than 50% respiratory variability, suggesting right  atrial pressure of 15 mmHg. IAS/Shunts: No atrial level shunt detected by color flow Doppler.  LEFT VENTRICLE PLAX 2D LVIDd:         3.11 cm LVIDs:         2.56 cm LV PW:         1.56 cm LV IVS:        2.06 cm LVOT diam:     2.00 cm LV SV:         57 LV SV Index:   25 LVOT Area:     3.14 cm  RIGHT VENTRICLE             IVC RV Basal diam:  4.23 cm     IVC diam: 3.31 cm RV Mid diam:    3.45 cm RV S prime:     17.40 cm/s TAPSE (M-mode): 2.0 cm LEFT ATRIUM             Index       RIGHT ATRIUM           Index LA diam:        4.20 cm 1.83 cm/m  RA Area:     19.50 cm LA Vol (A2C):   65.9 ml 28.67 ml/m RA Volume:   54.60 ml  23.75 ml/m LA Vol (A4C):   39.5 ml 17.18 ml/m LA Biplane Vol: 53.6 ml 23.32 ml/m   AORTIC VALVE LVOT Vmax:   82.80 cm/s LVOT Vmean:  61.500 cm/s LVOT VTI:    0.183 m  AORTA Ao Root diam: 3.30 cm Ao Asc diam:  3.20 cm MITRAL VALVE                TRICUSPID VALVE MV Area (PHT): 2.87 cm     TR Peak grad:   19.4 mmHg MV Decel Time: 264 msec     TR Vmax:        220.00 cm/s MV E velocity: 93.00 cm/s MV A velocity: 125.00 cm/s  SHUNTS MV E/A ratio:  0.74         Systemic VTI:  0.18 m                             Systemic Diam: 2.00 cm Skeet Latch MD Electronically signed by Skeet Latch MD Signature Date/Time: 05/03/2019/4:28:27 PM    Final       Subjective: No new complaints.   Discharge Exam: Vitals:   05/23/19 1500 05/23/19 1532  BP:  107/63  Pulse:    Resp: 20   Temp:  97.9 F (36.6 C)  SpO2: 90%    Vitals:   05/23/19 1157 05/23/19 1400 05/23/19 1500 05/23/19 1532  BP: 92/60   107/63  Pulse:      Resp: _0 Temp: 98.6 F (37 C)   97.9 F (36.6 C)  TempSrc: Oral   Oral  SpO2: 90%  90%   Weight:      Height:        General: Pt is alert, awake, not in acute distress Cardiovascular: RRR, S1/S2 +, no rubs, no gallops Respiratory: CTA bilaterally, no wheezing, no rhonchi Abdominal: Soft, NT, ND, bowel sounds + Extremities: no edema, no cyanosis    The results of significant diagnostics from this hospitalization (including imaging, microbiology, ancillary and laboratory) are listed below for reference.     Microbiology: No results found for this or any previous visit (from the past 240 hour(s)).  Labs: BNP (last 3 results) Recent Labs    06/15/18 0744 05/02/19 2350 05/07/19 1112  BNP 49.0 441.1* 831.5*   Basic Metabolic Panel: Recent Labs  Lab 05/18/19 0456 05/18/19 0456 05/20/19 0623 05/21/19 0830 05/22/19 0331 05/22/19 1750 05/23/19 0222  NA 138  --  134* 132* 134*  --  134*  K 3.4*   < > 3.2* 2.6* 2.8* 3.2* 3.3*  CL 101  --  90* 84* 86*  --  89*  CO2 29  --  32 34* 35*  --  33*  GLUCOSE 123*  --  139* 140* 133*  --  130*   BUN 18  --  41* 50* 60*  --  53*  CREATININE 0.83  --  1.48* 1.30* 1.38*  --  1.09*  CALCIUM 8.6*  --  9.0 8.7* 9.1  --  9.0  MG  --   --   --  2.0  --   --   --    < > = values in this interval not displayed.   Liver Function Tests: No results for input(s): AST, ALT, ALKPHOS, BILITOT, PROT, ALBUMIN in the last 168 hours. No results for input(s): LIPASE, AMYLASE in the last 168 hours. No results for input(s): AMMONIA in the last 168 hours. CBC: Recent Labs  Lab 05/17/19 0412 05/23/19 0222  WBC 8.9 5.4  HGB 11.7* 12.9  HCT 37.6 39.5  MCV 86.6 83.5  PLT 385 498*   Cardiac Enzymes: No results for input(s): CKTOTAL, CKMB, CKMBINDEX, TROPONINI in the last 168 hours. BNP: Invalid input(s): POCBNP CBG: Recent Labs  Lab 05/22/19 2340 05/23/19 0249 05/23/19 0812 05/23/19 1158 05/23/19 1622  GLUCAP 117* 122* 126* 154* 141*   D-Dimer No results for input(s): DDIMER in the last 72 hours. Hgb A1c No results for input(s): HGBA1C in the last 72 hours. Lipid Profile No results for input(s): CHOL, HDL, LDLCALC, TRIG, CHOLHDL, LDLDIRECT in the last 72 hours. Thyroid function studies No results for input(s): TSH, T4TOTAL, T3FREE, THYROIDAB in the last 72 hours.  Invalid input(s): FREET3 Anemia work up No results for input(s): VITAMINB12, FOLATE, FERRITIN, TIBC, IRON, RETICCTPCT in the last 72 hours. Urinalysis    Component Value Date/Time   COLORURINE STRAW (A) 02/09/2016 2303   APPEARANCEUR CLEAR 02/09/2016 2303   LABSPEC 1.020 03/04/2016 1313   PHURINE 6.0 03/04/2016 1313   GLUCOSEU NEGATIVE 03/04/2016 1313   GLUCOSEU NEG mg/dL 11/14/2007 2146   HGBUR MODERATE (A) 03/04/2016 1313   BILIRUBINUR NEGATIVE 03/04/2016 1313   BILIRUBINUR NEGATIVE 08/04/2010 1138   KETONESUR NEGATIVE 03/04/2016 1313   PROTEINUR NEGATIVE 03/04/2016 1313   UROBILINOGEN 0.2 03/04/2016 1313   NITRITE NEGATIVE 03/04/2016 1313   LEUKOCYTESUR TRACE (A) 03/04/2016 1313   Sepsis Labs Invalid  input(s): PROCALCITONIN,  WBC,  LACTICIDVEN Microbiology No results found for this or any previous visit (from the past 240 hour(s)).   Time coordinating discharge: 34 minutes.   SIGNED:   Hosie Poisson, MD  [facility] 05/23/2019, 4:39 PM

## 2019-05-24 DIAGNOSIS — L03115 Cellulitis of right lower limb: Secondary | ICD-10-CM | POA: Diagnosis not present

## 2019-05-24 DIAGNOSIS — M2550 Pain in unspecified joint: Secondary | ICD-10-CM | POA: Diagnosis not present

## 2019-05-24 DIAGNOSIS — I272 Pulmonary hypertension, unspecified: Secondary | ICD-10-CM | POA: Diagnosis not present

## 2019-05-24 DIAGNOSIS — I5033 Acute on chronic diastolic (congestive) heart failure: Secondary | ICD-10-CM | POA: Diagnosis not present

## 2019-05-24 DIAGNOSIS — I959 Hypotension, unspecified: Secondary | ICD-10-CM | POA: Diagnosis not present

## 2019-05-24 DIAGNOSIS — Z7401 Bed confinement status: Secondary | ICD-10-CM | POA: Diagnosis not present

## 2019-05-24 DIAGNOSIS — J9600 Acute respiratory failure, unspecified whether with hypoxia or hypercapnia: Secondary | ICD-10-CM | POA: Diagnosis not present

## 2019-05-24 DIAGNOSIS — R0902 Hypoxemia: Secondary | ICD-10-CM | POA: Diagnosis not present

## 2019-05-24 DIAGNOSIS — J9622 Acute and chronic respiratory failure with hypercapnia: Secondary | ICD-10-CM | POA: Diagnosis not present

## 2019-05-24 DIAGNOSIS — J9621 Acute and chronic respiratory failure with hypoxia: Secondary | ICD-10-CM | POA: Diagnosis not present

## 2019-05-24 LAB — BASIC METABOLIC PANEL
Anion gap: 10 (ref 5–15)
BUN: 41 mg/dL — ABNORMAL HIGH (ref 6–20)
CO2: 33 mmol/L — ABNORMAL HIGH (ref 22–32)
Calcium: 8.7 mg/dL — ABNORMAL LOW (ref 8.9–10.3)
Chloride: 93 mmol/L — ABNORMAL LOW (ref 98–111)
Creatinine, Ser: 0.97 mg/dL (ref 0.44–1.00)
GFR calc Af Amer: >60 mL/min (ref >60)
GFR calc non Af Amer: >60 mL/min (ref >60)
Glucose, Bld: 123 mg/dL — ABNORMAL HIGH (ref 70–99)
Potassium: 4.3 mmol/L (ref 3.5–5.1)
Sodium: 136 mmol/L (ref 135–145)

## 2019-05-24 LAB — SARS CORONAVIRUS 2 (TAT 6-24 HRS): SARS Coronavirus 2: NEGATIVE

## 2019-05-24 LAB — GLUCOSE, CAPILLARY
Glucose-Capillary: 123 mg/dL — ABNORMAL HIGH (ref 70–99)
Glucose-Capillary: 126 mg/dL — ABNORMAL HIGH (ref 70–99)
Glucose-Capillary: 146 mg/dL — ABNORMAL HIGH (ref 70–99)

## 2019-05-24 NOTE — Progress Notes (Signed)
Gave report to [facility]

## 2019-05-24 NOTE — Discharge Summary (Signed)
Physician Discharge Summary  OZIE DIMARIA TOI:[id number] DOB: [birth year] DOA: 05/02/2019  PCP: [name], MD  Admit date: 05/02/2019 Discharge date: 05/24/2019  Admitted From: [facility].  Disposition:  SNF  Recommendations for Outpatient Follow-up:  1. Follow up with PCP in 1-2 weeks 2. Please obtain BMP/CBC in one week Please follow up with cardiology as recommended  Discharge Condition stable.  CODE STATUS: partial code.  Diet recommendation: Heart Healthy   Brief/Interim Summary: [name] is a 59 y.o. female with a history of severe PAH on Remodulin and sildenafil, OSA, morbid obesity, diabetes type 2, CKD. Patient presented secondary to fever, cough, hypotension and worsening hypoxia with evidence of sepsis from cellulitis and pneumonia in addition to acute heart failure.   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 Patient is on baseline 4 L oxygen as an outpatient. Currently still requiring HFNC at 5 L -Wean to home 4 L as able  Septic shock Secondary to pneumonia/LE cellulitis and bacteremia. Patient required ICU admission and IV vasopressor support. Sepsis resolved. Patient completed antibiotic course.  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 Weight downtrended initially with diuresis.  -Cardiology recommendations: Continue diuresis (restarted on 5/12)  with torsemide 80 mg BID. acetazolamide discontinued 5/14, continue Remodulin IV; Metolazone x1 given on 5/14  Hypotension Likely secondary to heavy diuresis. MAP is adequate. Improving with reduction in diuresis.  AKI Secondary to heavy diuresis. Improved slightly Back on torsemide and spironolactone. Creatinine at baseline.   Hypokalemia In setting of diuresis. On Spironolactone. . Magnesium obtained on 5/17 is normal  increased to Kdur 40 meq TID  Demand ischemia Initial concern for NSTEMI. Cardiology consulted performed left/right heart catheterization without evidence of significant obstructive disease.  Streptococcus bacteremia Completed antibiotic course.  Right lower extremity cellulitis Treated with vancomycin and cefazolin initially and transitioned to amoxacillin to complete antibiotic course.  Paroxysmal atrial fibrillation -Continue Xarelto  Allergies Mostly seasonal. -Claritin and Flonase  Non-sustained V-tach Watch electrolytes. Continue telemetry  Obesity Body mass index is 46.36 kg/m.   Discharge Instructions  Discharge Instructions    Diet - low sodium heart healthy   Complete by: As directed    Discharge instructions   Complete by: As directed    Please follow up with PCP in one week and cardiology as recommended.     Allergies as of 05/24/2019      Reactions   Aspirin    REACTION: airway swelling   Codeine    REACTION: tingling in lips and hard breathing - had reaction at dentist - states "I can't take certain kinds of codeine" - happened maybe 10 yr ago   Lisinopril    Cough   Sulfonamide Derivatives    REACTION: airway swelling   Latex Rash      Medication List    STOP taking these medications   cetirizine 10 MG tablet Commonly known as: ZYRTEC  cyclobenzaprine 5 MG tablet Commonly known as: FLEXERIL   diclofenac sodium 1 % Gel Commonly known as: VOLTAREN   fluconazole 150 MG tablet Commonly known as: Diflucan    mupirocin ointment 2 % Commonly known as: BACTROBAN   nystatin cream Commonly known as: MYCOSTATIN   sodium chloride 0.65 % Soln nasal spray Commonly known as: OCEAN     TAKE these medications   Accu-Chek Aviva Plus test strip Generic drug: glucose blood USE 1 STRIP TO CHECK GLUCOSE ONCE DAILY   acetaminophen 500 MG tablet Commonly known as: TYLENOL Take 1 tablet (500 mg total) by mouth every 6 (six) hours as needed. What changed: reasons to take this   docusate sodium 100 MG capsule Commonly known as: COLACE Take 1 capsule (100 mg total) by mouth 2 (two) times daily as needed for mild constipation.   ferrous sulfate 325 (65 FE) MG tablet Take 1 tablet (325 mg total) by mouth daily with breakfast.   fluticasone 50 MCG/ACT nasal spray Commonly known as: Flonase Place 1 spray into both nostrils daily.   gabapentin 300 MG capsule Commonly known as: NEURONTIN Take 2 capsules (600 mg total) by mouth 2 (two) times daily. What changed: when to take this   metFORMIN 500 MG tablet Commonly known as: GLUCOPHAGE TAKE 1/2 (ONE-HALF) TABLET BY MOUTH WITH BREAKFAST What changed:   how much to take  how to take this  when to take this  additional instructions   pantoprazole 20 MG tablet Commonly known as: PROTONIX Take 1 tablet (20 mg total) by mouth 2 (two) times daily.   polyethylene glycol 17 g packet Commonly known as: MIRALAX / GLYCOLAX Take 17 g by mouth daily as needed for moderate constipation.   potassium chloride SA 20 MEQ tablet Commonly known as: KLOR-CON Take 2 tablets (40 mEq total) by mouth 3 (three) times daily. What changed: when to take this   pravastatin 20 MG tablet Commonly known as: PRAVACHOL Take 1 tablet by mouth once daily   sildenafil 20 MG tablet Commonly known as: REVATIO Take 1 tablet (20 mg total) by mouth 3 (three) times daily. What changed:   how much to take  when to take this   spironolactone 25 MG tablet Commonly known  as: ALDACTONE Take 1/2 (one-half) tablet by mouth once daily   torsemide 20 MG tablet Commonly known as: DEMADEX Take 4 tablets (80 mg total) by mouth 2 (two) times daily. What changed: Another medication with the same name was removed. Continue taking this medication, and follow the directions you see here.   treprostinil 5 MG/ML Soln injection Commonly known as: REMODULIN Inject 0.667 ng/kg/min into the skin continuous.   vitamin B-12 500 MCG tablet Commonly known as: CYANOCOBALAMIN Take 500 mcg by mouth daily.   Xarelto 20 MG Tabs tablet Generic drug: rivaroxaban TAKE 1 TABLET BY MOUTH ONCE DAILY WITH SUPPER What changed: See the new instructions.      Follow-up Information    Rigoberto Noel, MD. Go on 06/19/2019.   Specialty: Pulmonary Disease Why: at 1130 am.  Please call our office if you need to reschedule.  Contact information: [place] [address] [phone]        [name], MD. Schedule an appointment as soon as possible for a visit in 1 week(s).   Specialty: Internal Medicine Contact information: [place] [id number] [phone]          Allergies  Allergen  Reactions  . Aspirin     REACTION: airway swelling  . Codeine     REACTION: tingling in lips and hard breathing - had reaction at dentist - states "I can't take certain kinds of codeine" - happened maybe 10 yr ago  . Lisinopril     Cough  . Sulfonamide Derivatives     REACTION: airway swelling  . Latex Rash    Consultations:  Cardiology   Procedures/Studies: CARDIAC CATHETERIZATION  Result Date: 05/14/2019  Mid LAD lesion is 30% stenosed.  Findings: Ao = 94/64 (78) LV = 86/11 RA =  6 RV = 69/4 PA = 63/17 (37) PCW = 7 Fick cardiac output/index = 6.0/2.8 PVR = 5.0 WU Ao sat = 96% PA sat = 66%, 68% Assessment: 1. 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 Normal LVEF 60-65% 3. Low volume status 4. Mild to moderate PAH (much improved with  remodulin) Plan/Discussion: No evidence of significant CAD. Hemodynamically optimized on current regimen. Stop IV lasix. Glori Bickers, MD 2:50 PM   CT Chest High Resolution  Result Date: 05/10/2019 CLINICAL DATA:  Inpatient. Hypoxemia. EXAM: CT CHEST WITHOUT CONTRAST TECHNIQUE: Multidetector CT imaging of the chest was performed following the standard protocol without intravenous contrast. High resolution imaging of the lungs, as well as inspiratory and expiratory imaging, was performed. COMPARISON:  05/04/2019 chest radiograph. 06/24/2008 chest CT. FINDINGS: Markedly motion degraded scan, significantly limiting assessment. Cardiovascular: Moderate cardiomegaly. Small to moderate pericardial effusion is stable. Left anterior descending and right coronary atherosclerosis. Right internal jugular central venous catheter terminates in the middle third of the SVC. Left internal jugular central venous catheter terminates in the middle third of the SVC. Atherosclerotic nonaneurysmal thoracic aorta. Stable dilated main pulmonary artery (5.0 cm diameter). Mediastinum/Nodes: No discrete thyroid nodules. Unremarkable esophagus. No axillary adenopathy. Stable enlarged 1.8 cm lateral right prevascular node (series 5/image 48). No new pathologically enlarged mediastinal nodes. No discrete hilar adenopathy on these noncontrast images. Lungs/Pleura: No pneumothorax. Small right and trace left dependent pleural effusions. Dense consolidation with air bronchograms and volume loss in the dependent lower lobes bilaterally. Mild patchy ground-glass opacity in the peribronchovascular lungs bilaterally. No significant regions of bronchiectasis or frank honeycombing. No discrete lung masses or significant pulmonary nodules on these limited images. No evidence of significant air trapping or tracheobronchomalacia. Upper abdomen: Finely irregular liver surface, compatible with cirrhosis. Trace perihepatic ascites. Musculoskeletal: No  aggressive appearing focal osseous lesions. Moderate thoracic spondylosis. IMPRESSION: 1. Markedly motion degraded scan, significantly limiting assessment. No findings to suggest underlying interstitial lung disease. 2. Dense consolidation with air bronchograms and volume loss in the dependent lower lobes bilaterally. Favor atelectasis, with a component of aspiration or pneumonia not excluded. 3. Small right and trace left dependent pleural effusions. 4. Moderate cardiomegaly. Chronic small to moderate pericardial effusion. Mild patchy ground-glass opacities in a peribronchovascular lungs, which may indicate mild pulmonary edema. 5. Stable dilated main pulmonary artery, suggesting chronic pulmonary arterial hypertension. 6. Cirrhosis. Trace perihepatic ascites. 7. Stable mild mediastinal lymphadenopathy, nonspecific, probably reactive. 8. Two-vessel coronary atherosclerosis. 9. Aortic Atherosclerosis (ICD10-I70.0). Electronically Signed   By: Ilona Sorrel M.D.   On: 05/10/2019 15:38   IR Fluoro Guide CV Line Right  Result Date: 05/09/2019 CLINICAL DATA:  Pulmonary arterial hypertension, needs durable venous access EXAM: TUNNELED CENTRAL VENOUS CATHETER PLACEMENT WITH ULTRASOUND AND FLUOROSCOPIC GUIDANCE TECHNIQUE: The procedure, risks, benefits, and alternatives were explained to the patient. Questions regarding the procedure were encouraged and answered. The patient understands and consents to the  procedure. Patency of the right IJ vein was confirmed with ultrasound with image documentation. An appropriate skin site was determined. Region was prepped using maximum barrier technique including cap and mask, sterile gown, sterile gloves, large sterile sheet, and Chlorhexidine as cutaneous antisepsis. The region was infiltrated locally with 1% lidocaine. Under real-time ultrasound guidance, the right IJ vein was accessed with a 21 gauge micropuncture needle; the needle tip within the vein was confirmed with  ultrasound image documentation. 52F dual-lumen cuffed powerPICC tunneled from a right anterior chest wall approach to the dermatotomy site. Needle exchanged over the 018 guidewire for transitional dilator, through which the catheter which had been cut to 20 cm was advanced under intermittent fluoroscopy, positioned with its tip in the mid SVC. Spot chest radiograph confirms good catheter position. No pneumothorax. Catheter was flushed per protocol. Catheter secured externally with O Prolene suture. The right IJ dermatotomy site was closed with Dermabond. COMPLICATIONS: COMPLICATIONS None immediate FLUOROSCOPY TIME:  0.4 minute; 44 uGym2 DAP COMPARISON:  None IMPRESSION: 1. Technically successful placement of tunneled right IJ tunneled dual-lumen power injectable catheter with ultrasound and fluoroscopic guidance. Ready for routine use. Electronically Signed   By: Lucrezia Europe M.D.   On: 05/09/2019 12:28   IR Removal Tun Cv Cath W/O FL  Result Date: 05/09/2019 INDICATION: Request for removal of tunneled central venous catheter secondary to bacteremia. EXAM: REMOVAL OF TUNNELED CENTRAL VENOUS CATHETER MEDICATIONS: None COMPLICATIONS: None immediate. PROCEDURE: Informed written consent was obtained from the patient following an explanation of the procedure, risks, benefits and alternatives to treatment. A time out was performed prior to the initiation of the procedure. Maximal barrier sterile technique was utilized including caps, mask, gloves, and hand hygiene. Utilizing only gentle traction, the catheter was removed intact. Hemostasis was obtained with manual compression. A dressing was placed. The patient tolerated the procedure well without immediate post procedural complication. IMPRESSION: Successful removal of tunneled central venous catheter. Read by: Gareth Eagle, PA-C Electronically Signed   By: Lucrezia Europe M.D.   On: 05/08/2019 16:53   IR US Guide Vasc Access Right  Result Date: 05/09/2019 CLINICAL DATA:   Pulmonary arterial hypertension, needs durable venous access EXAM: TUNNELED CENTRAL VENOUS CATHETER PLACEMENT WITH ULTRASOUND AND FLUOROSCOPIC GUIDANCE TECHNIQUE: The procedure, risks, benefits, and alternatives were explained to the patient. Questions regarding the procedure were encouraged and answered. The patient understands and consents to the procedure. Patency of the right IJ vein was confirmed with ultrasound with image documentation. An appropriate skin site was determined. Region was prepped using maximum barrier technique including cap and mask, sterile gown, sterile gloves, large sterile sheet, and Chlorhexidine as cutaneous antisepsis. The region was infiltrated locally with 1% lidocaine. Under real-time ultrasound guidance, the right IJ vein was accessed with a 21 gauge micropuncture needle; the needle tip within the vein was confirmed with ultrasound image documentation. 52F dual-lumen cuffed powerPICC tunneled from a right anterior chest wall approach to the dermatotomy site. Needle exchanged over the 018 guidewire for transitional dilator, through which the catheter which had been cut to 20 cm was advanced under intermittent fluoroscopy, positioned with its tip in the mid SVC. Spot chest radiograph confirms good catheter position. No pneumothorax. Catheter was flushed per protocol. Catheter secured externally with O Prolene suture. The right IJ dermatotomy site was closed with Dermabond. COMPLICATIONS: COMPLICATIONS None immediate FLUOROSCOPY TIME:  0.4 minute; 44 uGym2 DAP COMPARISON:  None IMPRESSION: 1. Technically successful placement of tunneled right IJ tunneled dual-lumen power injectable catheter with  ultrasound and fluoroscopic guidance. Ready for routine use. Electronically Signed   By: Lucrezia Europe M.D.   On: 05/09/2019 12:28   DG CHEST PORT 1 VIEW  Result Date: 05/04/2019 CLINICAL DATA:  Status post central line placement EXAM: PORTABLE CHEST 1 VIEW COMPARISON:  05/03/2019 FINDINGS:  Cardiac shadow is enlarged but stable. Left jugular central line is noted in the proximal superior vena cava. Right-sided Hickman is noted in the superior vena cava as well. No pneumothorax is noted. Mild central vascular congestion is noted similar to that seen on the prior exam. No sizable effusion is noted. No bony abnormality is seen. IMPRESSION: No pneumothorax following central line placement. Catheter tip is noted in the proximal superior vena cava. Central vascular congestion is again seen with cardiomegaly. Electronically Signed   By: Inez Catalina M.D.   On: 05/04/2019 23:46   DG CHEST PORT 1 VIEW  Result Date: 05/03/2019 CLINICAL DATA:  BiPAP counseling EXAM: PORTABLE CHEST 1 VIEW COMPARISON:  05/03/2019, 05/02/2019, 06/25/2018 FINDINGS: Bilateral central venous catheter tips overlie the upper SVC. Stable enlarged cardiomediastinal silhouette with vascular congestion. Probable small left effusion. Generous central pulmonary vessels suggesting arterial hypertension. No pneumothorax. IMPRESSION: Cardiomegaly with vascular congestion and probable small left effusion. Enlarged central pulmonary vessels suggesting arterial hypertension. Overall no significant change since prior radiograph Electronically Signed   By: Donavan Foil M.D.   On: 05/03/2019 19:33   DG CHEST PORT 1 VIEW  Result Date: 05/03/2019 CLINICAL DATA:  59 year old female status post central line placement. EXAM: PORTABLE CHEST 1 VIEW COMPARISON:  Chest x-ray 05/03/2019. FINDINGS: There is a left-sided internal jugular central venous catheter with tip terminating in the distal superior vena cava. There is a right-sided internal jugular central venous catheter with tip terminating in the distal superior vena cava. Lung volumes are normal. There is cephalization of the pulmonary vasculature and slight indistinctness of the interstitial markings suggestive of mild pulmonary edema. Central pulmonary arteries are markedly dilated. No  definite pleural effusions. No pneumothorax. Mild cardiomegaly. Upper mediastinal contours are within normal limits. IMPRESSION: 1. Support apparatus, as above. 2. The appearance the chest suggests mild congestive heart failure. 3. Dilated central pulmonary arteries again noted, suggesting pulmonary arterial hypertension. Electronically Signed   By: Vinnie Langton M.D.   On: 05/03/2019 14:39   DG Chest Port 1 View  Result Date: 05/03/2019 CLINICAL DATA:  Respiratory failure. EXAM: PORTABLE CHEST 1 VIEW COMPARISON:  05/02/2019 FINDINGS: Stable cardiomegaly. Right-sided central venous catheter remains in appropriate position. Stable enlarged central pulmonary arteries, consistent with pulmonary arterial hypertension both lungs are clear. IMPRESSION: Stable cardiomegaly, and enlarged central pulmonary arteries consistent with pulmonary arterial hypertension. No active lung disease. Electronically Signed   By: Marlaine Hind M.D.   On: 05/03/2019 08:28   DG Chest Portable 1 View  Result Date: 05/02/2019 CLINICAL DATA:  Shortness of breath EXAM: PORTABLE CHEST 1 VIEW COMPARISON:  06/19/2018 FINDINGS: Right-sided central venous catheter tip over the cavoatrial region. Cardiomegaly with central vascular congestion. Enlarged central pulmonary vessels suggesting arterial hypertension. No consolidation or effusion. No pneumothorax. IMPRESSION: 1. Cardiomegaly with central vascular congestion 2. Enlarged central pulmonary vessels suggesting arterial hypertension Electronically Signed   By: Donavan Foil M.D.   On: 05/02/2019 20:51   ECHOCARDIOGRAM COMPLETE  Result Date: 05/03/2019    ECHOCARDIOGRAM REPORT   Patient Name:   [name] Date of Exam: 05/03/2019 Medical Rec #:  [id number]       Height:  63.5 in Accession #:    [id number]      Weight:       296.3 lb Date of Birth:  Apr 26, 1960       BSA:          2.299 m Patient Age:    59 years        BP:           112/66 mmHg Patient Gender: F               HR:            106 bpm. Exam Location:  Inpatient Procedure: 2D Echo, Cardiac Doppler, Color Doppler and Intracardiac            Opacification Agent Indications:    Respiratory Failure with Hypoxia [id number]  History:        Patient has prior history of Echocardiogram examinations, most                 recent 11/07/2014. CHF, Pulmonary HTN and COPD,                 Signs/Symptoms:Dyspnea; Risk Factors:Hypertension, Diabetes,                 Dyslipidemia, Sleep Apnea and GERD.  Sonographer:    Jonelle Sidle Dance Referring Phys: [id number] RAHUL P DESAI IMPRESSIONS  1. Pulmonary pressures are much lower than on previous studies. Given the reduced right ventricular systolic function, this may represent progressive right ventricular dysfunction rather than improvement.  2. Left ventricular ejection fraction, by estimation, is 65 to 70%. The left ventricle has normal function. The left ventricle has no regional wall motion abnormalities. Left ventricular diastolic parameters are consistent with Grade I diastolic dysfunction (impaired relaxation).  3. Right ventricular systolic function is moderately reduced. The right ventricular size is moderately enlarged. There is normal pulmonary artery systolic pressure.  4. The mitral valve is normal in structure. No evidence of mitral valve regurgitation. No evidence of mitral stenosis.  5. The aortic valve is tricuspid. Aortic valve regurgitation is not visualized. No aortic stenosis is present.  6. The inferior vena cava is dilated in size with <50% respiratory variability, suggesting right atrial pressure of 15 mmHg. FINDINGS  Left Ventricle: Left ventricular ejection fraction, by estimation, is 65 to 70%. The left ventricle has normal function. The left ventricle has no regional wall motion abnormalities. Definity contrast agent was given IV to delineate the left ventricular  endocardial borders. The left ventricular internal cavity size was normal in size. There is no left ventricular  hypertrophy. Left ventricular diastolic parameters are consistent with Grade I diastolic dysfunction (impaired relaxation). Right Ventricle: The right ventricular size is moderately enlarged. No increase in right ventricular wall thickness. Right ventricular systolic function is moderately reduced. There is normal pulmonary artery systolic pressure. The tricuspid regurgitant velocity is 2.20 m/s, and with an assumed right atrial pressure of 15 mmHg, the estimated right ventricular systolic pressure is 68.0 mmHg. Left Atrium: Left atrial size was normal in size. Right Atrium: Right atrial size was normal in size. Pericardium: There is no evidence of pericardial effusion. Mitral Valve: The mitral valve is normal in structure. Normal mobility of the mitral valve leaflets. No evidence of mitral valve regurgitation. No evidence of mitral valve stenosis. Tricuspid Valve: The tricuspid valve is normal in structure. Tricuspid valve regurgitation is trivial. No evidence of tricuspid stenosis. Aortic Valve: The aortic valve is tricuspid. Aortic valve regurgitation is not visualized. No aortic  stenosis is present. Pulmonic Valve: The pulmonic valve was normal in structure. Pulmonic valve regurgitation is not visualized. No evidence of pulmonic stenosis. Aorta: The aortic root is normal in size and structure. Venous: The inferior vena cava is dilated in size with less than 50% respiratory variability, suggesting right atrial pressure of 15 mmHg. IAS/Shunts: No atrial level shunt detected by color flow Doppler.  LEFT VENTRICLE PLAX 2D LVIDd:         3.11 cm LVIDs:         2.56 cm LV PW:         1.56 cm LV IVS:        2.06 cm LVOT diam:     2.00 cm LV SV:         57 LV SV Index:   25 LVOT Area:     3.14 cm  RIGHT VENTRICLE             IVC RV Basal diam:  4.23 cm     IVC diam: 3.31 cm RV Mid diam:    3.45 cm RV S prime:     17.40 cm/s TAPSE (M-mode): 2.0 cm LEFT ATRIUM             Index       RIGHT ATRIUM           Index LA diam:         4.20 cm 1.83 cm/m  RA Area:     19.50 cm LA Vol (A2C):   65.9 ml 28.67 ml/m RA Volume:   54.60 ml  23.75 ml/m LA Vol (A4C):   39.5 ml 17.18 ml/m LA Biplane Vol: 53.6 ml 23.32 ml/m  AORTIC VALVE LVOT Vmax:   82.80 cm/s LVOT Vmean:  61.500 cm/s LVOT VTI:    0.183 m  AORTA Ao Root diam: 3.30 cm Ao Asc diam:  3.20 cm MITRAL VALVE                TRICUSPID VALVE MV Area (PHT): 2.87 cm     TR Peak grad:   19.4 mmHg MV Decel Time: 264 msec     TR Vmax:        220.00 cm/s MV E velocity: 93.00 cm/s MV A velocity: 125.00 cm/s  SHUNTS MV E/A ratio:  0.74         Systemic VTI:  0.18 m                             Systemic Diam: 2.00 cm Skeet Latch MD Electronically signed by Skeet Latch MD Signature Date/Time: 05/03/2019/4:28:27 PM    Final       Subjective: No new complaints.   Discharge Exam: Vitals:   05/24/19 0800 05/24/19 0850  BP: 101/70   Pulse: 84   Resp: 14   Temp: 98.8 F (37.1 C)   SpO2: 98% 95%   Vitals:   05/24/19 0359 05/24/19 0423 05/24/19 0800 05/24/19 0850  BP:  104/67 101/70   Pulse: 82 81 84   Resp: _0 Temp:  97.8 F (36.6 C) 98.8 F (37.1 C)   TempSrc:  Oral Axillary   SpO2: 94% 95% 98% 95%  Weight:      Height:        General: Pt is alert, awake, not in acute distress Cardiovascular: RRR, S1/S2 +, no rubs, no gallops Respiratory: CTA bilaterally, no wheezing, no rhonchi Abdominal: Soft, NT, ND,  bowel sounds + Extremities: no edema, no cyanosis    The results of significant diagnostics from this hospitalization (including imaging, microbiology, ancillary and laboratory) are listed below for reference.     Microbiology: Recent Results (from the past 240 hour(s))  SARS CORONAVIRUS 2 (TAT 6-24 HRS) Nasopharyngeal Nasopharyngeal Swab     Status: None   Collection Time: 05/23/19  1:42 PM   Specimen: Nasopharyngeal Swab  Result Value Ref Range Status   SARS Coronavirus 2 NEGATIVE NEGATIVE Final    Comment: (NOTE) SARS-CoV-2 target  nucleic acids are NOT DETECTED. The SARS-CoV-2 RNA is generally detectable in upper and lower respiratory specimens during the acute phase of infection. Negative results do not preclude SARS-CoV-2 infection, do not rule out co-infections with other pathogens, and should not be used as the sole basis for treatment or other patient management decisions. Negative results must be combined with clinical observations, patient history, and epidemiological information. The expected result is Negative. Fact Sheet for Patients: SugarRoll.be Fact Sheet for Healthcare Providers: https://www.woods-mathews.com/ This test is not yet approved or cleared by the [place] FDA and  has been authorized for detection and/or diagnosis of SARS-CoV-2 by FDA under an Emergency Use Authorization (EUA). This EUA will remain  in effect (meaning this test can be used) for the duration of the COVID-19 declaration under Section 56 4(b)(1) of the Act, 21 U.S.C. section 360bbb-3(b)(1), unless the authorization is terminated or revoked sooner. Performed at [facility], [place] [address] [place], [place] [address]      Labs: BNP (last 3 results) Recent Labs    06/15/18 0744 05/02/19 2350 05/07/19 1112  BNP 49.0 441.1* 338.2*   Basic Metabolic Panel: Recent Labs  Lab 05/20/19 0623 05/20/19 0623 05/21/19 0830 05/22/19 0331 05/22/19 1750 05/23/19 0222 05/24/19 0749  NA 134*  --  132* 134*  --  134* 136  K 3.2*   < > 2.6* 2.8* 3.2* 3.3* 4.3  CL 90*  --  84* 86*  --  89* 93*  CO2 32  --  34* 35*  --  33* 33*  GLUCOSE 139*  --  140* 133*  --  130* 123*  BUN 41*  --  50* 60*  --  53* 41*  CREATININE 1.48*  --  1.30* 1.38*  --  1.09* 0.97  CALCIUM 9.0  --  8.7* 9.1  --  9.0 8.7*  MG  --   --  2.0  --   --   --   --    < > = values in this interval not displayed.   Liver Function Tests: No results for input(s): AST, ALT, ALKPHOS, BILITOT, PROT, ALBUMIN in  the last 168 hours. No results for input(s): LIPASE, AMYLASE in the last 168 hours. No results for input(s): AMMONIA in the last 168 hours. CBC: Recent Labs  Lab 05/23/19 0222  WBC 5.4  HGB 12.9  HCT 39.5  MCV 83.5  PLT 498*   Cardiac Enzymes: No results for input(s): CKTOTAL, CKMB, CKMBINDEX, TROPONINI in the last 168 hours. BNP: Invalid input(s): POCBNP CBG: Recent Labs  Lab 05/23/19 1622 05/23/19 1935 05/23/19 2325 05/24/19 0427 05/24/19 0737  GLUCAP 141* 164* 135* 123* 126*   D-Dimer No results for input(s): DDIMER in the last 72 hours. Hgb A1c No results for input(s): HGBA1C in the last 72 hours. Lipid Profile No results for input(s): CHOL, HDL, LDLCALC, TRIG, CHOLHDL, LDLDIRECT in the last 72 hours. Thyroid function studies No results for input(s):  TSH, T4TOTAL, T3FREE, THYROIDAB in the last 72 hours.  Invalid input(s): FREET3 Anemia work up No results for input(s): VITAMINB12, FOLATE, FERRITIN, TIBC, IRON, RETICCTPCT in the last 72 hours. Urinalysis    Component Value Date/Time   COLORURINE STRAW (A) 02/09/2016 2303   APPEARANCEUR CLEAR 02/09/2016 2303   LABSPEC 1.020 03/04/2016 1313   PHURINE 6.0 03/04/2016 1313   GLUCOSEU NEGATIVE 03/04/2016 1313   GLUCOSEU NEG mg/dL 11/14/2007 2146   HGBUR MODERATE (A) 03/04/2016 1313   BILIRUBINUR NEGATIVE 03/04/2016 1313   BILIRUBINUR NEGATIVE 08/04/2010 1138   KETONESUR NEGATIVE 03/04/2016 1313   PROTEINUR NEGATIVE 03/04/2016 1313   UROBILINOGEN 0.2 03/04/2016 1313   NITRITE NEGATIVE 03/04/2016 1313   LEUKOCYTESUR TRACE (A) 03/04/2016 1313   Sepsis Labs Invalid input(s): PROCALCITONIN,  WBC,  LACTICIDVEN Microbiology Recent Results (from the past 240 hour(s))  SARS CORONAVIRUS 2 (TAT 6-24 HRS) Nasopharyngeal Nasopharyngeal Swab     Status: None   Collection Time: 05/23/19  1:42 PM   Specimen: Nasopharyngeal Swab  Result Value Ref Range Status   SARS Coronavirus 2 NEGATIVE NEGATIVE Final    Comment:  (NOTE) SARS-CoV-2 target nucleic acids are NOT DETECTED. The SARS-CoV-2 RNA is generally detectable in upper and lower respiratory specimens during the acute phase of infection. Negative results do not preclude SARS-CoV-2 infection, do not rule out co-infections with other pathogens, and should not be used as the sole basis for treatment or other patient management decisions. Negative results must be combined with clinical observations, patient history, and epidemiological information. The expected result is Negative. Fact Sheet for Patients: SugarRoll.be Fact Sheet for Healthcare Providers: https://www.woods-mathews.com/ This test is not yet approved or cleared by the [place] FDA and  has been authorized for detection and/or diagnosis of SARS-CoV-2 by FDA under an Emergency Use Authorization (EUA). This EUA will remain  in effect (meaning this test can be used) for the duration of the COVID-19 declaration under Section 56 4(b)(1) of the Act, 21 U.S.C. section 360bbb-3(b)(1), unless the authorization is terminated or revoked sooner. Performed at [facility], [place] [address], [place], [place] [address]      Time coordinating discharge: 34 minutes.   SIGNED:   Hosie Poisson, MD  [facility] 05/24/2019, 10:12 AM

## 2019-05-24 NOTE — Progress Notes (Signed)
RT in to pt room to take off Bipap per pt request. Pt taken off and placed on 7L high flow nasal cannula. Wet washrag given per pt request to wipe face off. RT will continue to monitor.

## 2019-05-24 NOTE — Progress Notes (Signed)
Call to give report to [facility] place, transferred to another line with no answer. Will attempt to call back to give report.

## 2019-05-24 NOTE — TOC Transition Note (Signed)
Transition of Care [facility]) - CM/SW Discharge Note   Patient Details  Name: [name] MRN: [id number] Date of Birth: October 04, 1960  Transition of Care [facility]) CM/SW Contact:  Bethann Berkshire, [place] Phone Number: 05/24/2019, 10:27 AM   Clinical Narrative:     Patient will DC to: [facility] Anticipated DC date: 05/24/19 Family notified: Lonya, Johannesen (Son)  [phone] (Home Phone Transport by: Corey Harold   Per MD patient ready for DC to [facility] . RN, patient, patient's family, and facility notified of DC. Discharge Summary and FL2 sent to facility. RN to call report prior to discharge [phone]). DC packet on chart. Ambulance transport requested for patient for 12pm.   CSW will sign off for now as social work intervention is no longer needed. Please consult [place] again if new needs arise.   Final next level of care: Acute to Acute Transfer Barriers to Discharge: No Barriers Identified   Patient Goals and CMS Choice Patient states their goals for this hospitalization and ongoing recovery are:: [name] CMS Medicare.gov Compare Post Acute Care list provided to:: Patient Choice offered to / list presented to : Patient  Discharge Placement              Patient chooses bed at: [facility] Patient to be transferred to facility by: [place] Name of family member notified: Haliey, Romberg (Son) [phone] (Home Phone Patient and family notified of of transfer: 05/24/19  Discharge Plan and Services                                     Social Determinants of Health (SDOH) Interventions     Readmission Risk Interventions No flowsheet data found.

## 2019-05-28 DIAGNOSIS — E089 Diabetes mellitus due to underlying condition without complications: Secondary | ICD-10-CM | POA: Diagnosis not present

## 2019-05-28 DIAGNOSIS — I272 Pulmonary hypertension, unspecified: Secondary | ICD-10-CM | POA: Diagnosis not present

## 2019-05-28 DIAGNOSIS — B955 Unspecified streptococcus as the cause of diseases classified elsewhere: Secondary | ICD-10-CM | POA: Diagnosis not present

## 2019-05-28 DIAGNOSIS — I10 Essential (primary) hypertension: Secondary | ICD-10-CM | POA: Diagnosis not present

## 2019-05-28 DIAGNOSIS — L03115 Cellulitis of right lower limb: Secondary | ICD-10-CM | POA: Diagnosis not present

## 2019-05-28 DIAGNOSIS — I5022 Chronic systolic (congestive) heart failure: Secondary | ICD-10-CM | POA: Diagnosis not present

## 2019-05-28 DIAGNOSIS — I50813 Acute on chronic right heart failure: Secondary | ICD-10-CM | POA: Diagnosis not present

## 2019-05-30 DIAGNOSIS — I480 Paroxysmal atrial fibrillation: Secondary | ICD-10-CM | POA: Diagnosis not present

## 2019-05-30 DIAGNOSIS — L03115 Cellulitis of right lower limb: Secondary | ICD-10-CM | POA: Diagnosis not present

## 2019-05-30 DIAGNOSIS — I272 Pulmonary hypertension, unspecified: Secondary | ICD-10-CM | POA: Diagnosis not present

## 2019-05-30 DIAGNOSIS — I50813 Acute on chronic right heart failure: Secondary | ICD-10-CM | POA: Diagnosis not present

## 2019-06-04 DIAGNOSIS — I272 Pulmonary hypertension, unspecified: Secondary | ICD-10-CM | POA: Diagnosis not present

## 2019-06-04 DIAGNOSIS — R21 Rash and other nonspecific skin eruption: Secondary | ICD-10-CM | POA: Diagnosis not present

## 2019-06-04 DIAGNOSIS — I50813 Acute on chronic right heart failure: Secondary | ICD-10-CM | POA: Diagnosis not present

## 2019-06-04 DIAGNOSIS — L03115 Cellulitis of right lower limb: Secondary | ICD-10-CM | POA: Diagnosis not present

## 2019-06-06 ENCOUNTER — Other Ambulatory Visit: Payer: Self-pay | Admitting: Internal Medicine

## 2019-06-13 ENCOUNTER — Other Ambulatory Visit: Payer: Self-pay | Admitting: Internal Medicine

## 2019-06-15 ENCOUNTER — Other Ambulatory Visit: Payer: Self-pay | Admitting: Internal Medicine

## 2019-06-15 ENCOUNTER — Telehealth: Payer: Self-pay | Admitting: Pulmonary Disease

## 2019-06-15 DIAGNOSIS — J069 Acute upper respiratory infection, unspecified: Secondary | ICD-10-CM

## 2019-06-15 NOTE — Telephone Encounter (Signed)
Spoke with the pt and scheduled appt with BW for 07/02/19 at 11:30 am

## 2019-06-19 ENCOUNTER — Inpatient Hospital Stay: Payer: Medicaid Other | Admitting: Pulmonary Disease

## 2019-07-02 ENCOUNTER — Encounter: Payer: Self-pay | Admitting: Primary Care

## 2019-07-02 ENCOUNTER — Ambulatory Visit (INDEPENDENT_AMBULATORY_CARE_PROVIDER_SITE_OTHER): Payer: Medicaid Other

## 2019-07-02 ENCOUNTER — Ambulatory Visit (INDEPENDENT_AMBULATORY_CARE_PROVIDER_SITE_OTHER): Payer: Medicaid Other | Admitting: Primary Care

## 2019-07-02 ENCOUNTER — Other Ambulatory Visit: Payer: Self-pay

## 2019-07-02 VITALS — BP 124/72 | HR 95 | Temp 98.9°F | Ht 63.0 in | Wt 273.6 lb

## 2019-07-02 DIAGNOSIS — I517 Cardiomegaly: Secondary | ICD-10-CM | POA: Diagnosis not present

## 2019-07-02 DIAGNOSIS — R0602 Shortness of breath: Secondary | ICD-10-CM

## 2019-07-02 DIAGNOSIS — L03115 Cellulitis of right lower limb: Secondary | ICD-10-CM | POA: Diagnosis not present

## 2019-07-02 DIAGNOSIS — I50810 Right heart failure, unspecified: Secondary | ICD-10-CM | POA: Diagnosis not present

## 2019-07-02 DIAGNOSIS — G4733 Obstructive sleep apnea (adult) (pediatric): Secondary | ICD-10-CM | POA: Diagnosis not present

## 2019-07-02 DIAGNOSIS — J189 Pneumonia, unspecified organism: Secondary | ICD-10-CM

## 2019-07-02 DIAGNOSIS — K766 Portal hypertension: Secondary | ICD-10-CM

## 2019-07-02 DIAGNOSIS — I272 Pulmonary hypertension, unspecified: Secondary | ICD-10-CM

## 2019-07-02 DIAGNOSIS — I878 Other specified disorders of veins: Secondary | ICD-10-CM

## 2019-07-02 DIAGNOSIS — I2721 Secondary pulmonary arterial hypertension: Secondary | ICD-10-CM

## 2019-07-02 DIAGNOSIS — I4892 Unspecified atrial flutter: Secondary | ICD-10-CM

## 2019-07-02 LAB — BASIC METABOLIC PANEL
BUN: 10 mg/dL (ref 6–23)
CO2: 35 mEq/L — ABNORMAL HIGH (ref 19–32)
Calcium: 8.9 mg/dL (ref 8.4–10.5)
Chloride: 97 mEq/L (ref 96–112)
Creatinine, Ser: 0.7 mg/dL (ref 0.40–1.20)
GFR: 85.92 mL/min (ref >60.00)
Glucose, Bld: 104 mg/dL — ABNORMAL HIGH (ref 70–99)
Potassium: 3.2 mEq/L — ABNORMAL LOW (ref 3.5–5.1)
Sodium: 138 mEq/L (ref 135–145)

## 2019-07-02 LAB — MAGNESIUM: Magnesium: 1.8 mg/dL (ref 1.5–2.5)

## 2019-07-02 LAB — BRAIN NATRIURETIC PEPTIDE: Pro B Natriuretic peptide (BNP): 49 pg/mL (ref 0.0–100.0)

## 2019-07-02 LAB — CBC
HCT: 39.9 % (ref 36.0–46.0)
Hemoglobin: 13 g/dL (ref 12.0–15.0)
MCHC: 32.7 g/dL (ref 30.0–36.0)
MCV: 84.6 fl (ref 78.0–100.0)
Platelets: 497 10*3/uL — ABNORMAL HIGH (ref 150.0–400.0)
RBC: 4.72 Mil/uL (ref 3.87–5.11)
RDW: 18.8 % — ABNORMAL HIGH (ref 11.5–15.5)
WBC: 8.6 10*3/uL (ref 4.0–10.5)

## 2019-07-02 NOTE — Assessment & Plan Note (Signed)
-  Maintained on BIPAP, does not appear she is currently following with a provider for management  - Requesting download from [facility]  - Patient needs to establish with sleep medicine

## 2019-07-02 NOTE — Assessment & Plan Note (Addendum)
-  Per patient erythema and swelling of right lower extremity has improve but does appear somewhat significant. No open wounds or drainage. - Checking CBC today and referring to lymphedema clinic

## 2019-07-02 NOTE — Progress Notes (Signed)
_0  ID: [name], female    DOB: 1960-05-28, 59 y.o.   MRN: [id number]  Chief Complaint  Patient presents with  . Hospitalization Follow-up    wheezing and SOB     Referring provider: [name], MD  HPI: 59 year old female, never smoked.  Past medical history significant for pneumonia, acute on chronic respiratory failure, allergic rhinitis, obstructive sleep apnea, A. fib, acute on chronic diastolic heart failure, pulmonary hypertension, GERD, diabetes, edema, obesity.  Patient of follows with Dr. Gilles Chiquito for dyspnea on exertion and pulmonary hypertension with [place] system. Maintained on IV Romodulin and Sildenafil.   Recent hospitalization 05/02/2019-05/24/2019: Patient was seen inpatient by Dr. Lake Bells and Dr. Shearon Stalls. She has past medical history significant for severe pulmonary arterial hypertension on pulmonary vasodilators.  Patient presented to ED secondary to fever, cough, hypotension and worsening hypoxia.  She was found to be severely septic d/t pneumonia and lower extremity cellulitis. Acute on chronic hypoxic and hypercarbic respiratory failure secondary to decompensated cor pulmonale.   She required heated high flow oxygen. Cardiology consulted and patient was diuresed.  Weights trended down initially.  She was restarted on torsemide 80 mg twice daily, sildenafil and Remodulin IV. She was eventually weaned to baseline 4 L oxygen. She had a right heart cath while in the hospital with Dr. Haroldine Laws which showed normal coronaries and much improved hemodynamics. Metolazone was given 5/14 d/t volume increase for heart failure. Flu and Covid negative.  Blood cultures were positive for strep G.  She was treated with vancomycin, ceftriaxone, azithromycin, cefazolin x1 and amoxicillin. Discharged to SNF. Monitor BMET. Goal keep potassium > 4.0 and Magnesium >2.0.   07/02/2019 Patient presents today for hospital follow-up. She was discharged from SNL on June  1st. Patient reports shortness of breath and wheezing. She has a chronic cough, gets up some mucus in the morning. Sputum is reported clear. She feels about the same from when she was discharged. She continues Remodulin IV through double-lumen [place]. Her oxygen today is 94% on 4 L pulsed. Her weight fluctuates. Today her weight was 273lbs. Patient completed oral antibiotics for right leg cellulitis, reports redness and swelling have slightly improved but remain fairly significant. Denies fever, chills, wheezing.   Patient is following with Dr. Haroldine Laws and Dr. Gilles Chiquito for severe [place].  She continues home IV remodulin at 0.667 ng/kg/min through [place] port (which was replaced on 5/5 by IR). This is managed by Dr. Gilles Chiquito with [facility]. She continues torsemide 80 mg twice daily and sildenafil. She only took 60m Torsemide today because she was leaving the house for today's visit.  She remains on BIPAP with 4L oxygen at night. She has been on this for approx 6 years. Her DME is [employer].  Our office does not manage her BiPAP, we will request a download with her DME company. She is scheduled for visit with Dr. FGilles Chiquitoend of July.    Allergies  Allergen Reactions  . Aspirin     REACTION: airway swelling  . Codeine     REACTION: tingling in lips and hard breathing - had reaction at dentist - states "I can't take certain kinds of codeine" - happened maybe 10 yr ago  . Lisinopril     Cough  . Sulfonamide Derivatives     REACTION: airway swelling  . Latex Rash    Immunization History  Administered Date(s) Administered  . Influenza Split 11/10/2010  . Influenza,inj,Quad PF,6+ Mos 11/15/2013, 09/24/2014, 02/02/2016  . Influenza-Unspecified 12/04/2012  .  PFIZER SARS-COV-2 Vaccination 04/19/2019, 05/03/2019  . Pneumococcal Polysaccharide-23 11/10/2010, 08/02/2016  . Tdap 03/03/2011    Past Medical History:  Diagnosis Date  . Anemia, iron deficiency    secondary to menhorrhagia, on oral iron, also b12  def, getting monthly b12 shots  . Cellulitis 05/2019   RIGHT LOWER EXTREMITY  . CHF (congestive heart failure) ([place])   . Chronic cough    secondary to alleriges and post nasal drip  . Cognitive impairment   . COPD (chronic obstructive pulmonary disease) ([place])   . Cor pulmonale (HCC)    PA Peak pressure 85mHg  . Depression   . Diabetes mellitus    well controlled on metformin  . Diastolic heart failure ([place]   . GERD (gastroesophageal reflux disease)   . H/O mental retardation   . Herpes   . Hyperlipidemia   . Hypertension   . OSA (obstructive sleep apnea)    CPAP  . Pulmonary hypertension ([place]   . Renal disorder   . Restrictive lung disease    PFTs 06/2012 (FVC 54% predicted and FEV1 68% predicted w minimal bronchodilator response).  . Shortness of breath   . Venous stasis ulcer ([place]    chornic, ?followed up at wound care center, multiple courses of antibiotics in past for cellulitis, on lasix    Tobacco History: Social History   Tobacco Use  Smoking Status Never Smoker  Smokeless Tobacco Never Used   Counseling given: Not Answered   Outpatient Medications Prior to Visit  Medication Sig Dispense Refill  . cyanocobalamin 500 MCG tablet Take 500 mcg by mouth daily.    .Marland Kitchendocusate sodium (COLACE) 100 MG capsule Take 1 capsule (100 mg total) by mouth 2 (two) times daily as needed for mild constipation. 10 capsule 0  . ferrous sulfate 325 (65 FE) MG tablet Take 1 tablet (325 mg total) by mouth daily with breakfast. 90 tablet 3  . gabapentin (NEURONTIN) 300 MG capsule Take 2 capsules (600 mg total) by mouth 2 (two) times daily.    . metFORMIN (GLUCOPHAGE) 500 MG tablet TAKE 1/2 (ONE-HALF) TABLET BY MOUTH WITH BREAKFAST 45 tablet 0  . pantoprazole (PROTONIX) 20 MG tablet Take 1 tablet (20 mg total) by mouth 2 (two) times daily. 180 tablet 3  . polyethylene glycol (MIRALAX / GLYCOLAX) 17 g packet Take 17 g by mouth daily as needed for moderate constipation. 14 each 0  .  potassium chloride SA (KLOR-CON) 20 MEQ tablet Take 2 tablets (40 mEq total) by mouth 3 (three) times daily.    . pravastatin (PRAVACHOL) 20 MG tablet Take 1 tablet by mouth once daily (Patient taking differently: Take 20 mg by mouth daily. ) 90 tablet 3  . sildenafil (REVATIO) 20 MG tablet Take 1 tablet (20 mg total) by mouth 3 (three) times daily. (Patient taking differently: Take 10 mg by mouth daily. ) 270 tablet 3  . torsemide (DEMADEX) 20 MG tablet Take 4 tablets (80 mg total) by mouth 2 (two) times daily.    .[name]20 MG TABS tablet TAKE 1 TABLET BY MOUTH ONCE DAILY WITH SUPPER (Patient taking differently: Take 20 mg by mouth daily with supper. ) 30 tablet 4  . ACCU-CHEK AVIVA PLUS test strip USE 1 STRIP TO CHECK GLUCOSE ONCE DAILY 50 each 3  . acetaminophen (TYLENOL) 500 MG tablet Take 1 tablet (500 mg total) by mouth every 6 (six) hours as needed. (Patient taking differently: Take 500 mg by mouth every 6 (six) hours as  needed for mild pain. ) 30 tablet 0  . EQ ALLERGY RELIEF, CETIRIZINE, 10 MG tablet Take 1 tablet by mouth once daily (Patient not taking: Reported on 07/02/2019) 30 tablet 0  . fluticasone (FLONASE) 50 MCG/ACT nasal spray Place 1 spray into both nostrils daily. (Patient not taking: Reported on 05/02/2019) 16 g 2  . spironolactone (ALDACTONE) 25 MG tablet Take 1/2 (one-half) tablet by mouth once daily (Patient not taking: Reported on 05/02/2019) 45 tablet 1  . treprostinil (REMODULIN) 5 MG/ML SOLN injection Inject 0.667 ng/kg/min into the skin continuous.  (Patient not taking: Reported on 07/02/2019)     No facility-administered medications prior to visit.   Review of Systems  Review of Systems  Respiratory: Positive for shortness of breath and wheezing. Negative for cough.   Cardiovascular: Positive for leg swelling.   Physical Exam  BP 124/72 (BP Location: Right Arm, Cuff Size: Large)   Pulse 95   Temp 98.9 F (37.2 C) (Oral)   Ht _0  (1.6 m)   Wt 273 lb 9.6 oz  (124.1 kg)   LMP 02/19/2011   SpO2 94%   BMI 48.47 kg/m  Physical Exam Constitutional:      General: She is not in acute distress.    Appearance: Normal appearance. She is obese. She is ill-appearing.  HENT:     Head: Normocephalic and atraumatic.     Mouth/Throat:     Mouth: Mucous membranes are moist.     Pharynx: Oropharynx is clear.     Comments: Mallampati class III Neck:     Comments: Large/thick neck Cardiovascular:     Rate and Rhythm: Normal rate.     Comments: Regular rhythm; +2-3 BLE edema (R>L) Pulmonary:     Breath sounds: Rales present.     Comments: Rales throughout. Dyspnea on exertion; O2 94% on 4L Musculoskeletal:     Comments: Ambulates with rolling walker  Skin:    General: Skin is warm and dry.     Findings: No rash.     Comments: BLE erythema; R>L. No open areas or drainage.   Neurological:     General: No focal deficit present.     Mental Status: She is alert and oriented to person, place, and time. Mental status is at baseline.  Psychiatric:        Mood and Affect: Mood normal.        Behavior: Behavior normal.        Thought Content: Thought content normal.        Judgment: Judgment normal.      Lab Results:  CBC    Component Value Date/Time   WBC 5.4 05/23/2019 0222   RBC 4.73 05/23/2019 0222   HGB 12.9 05/23/2019 0222   HGB 12.6 03/21/2015 1445   HCT 39.5 05/23/2019 0222   HCT 38.3 03/21/2015 1445   PLT 498 (H) 05/23/2019 0222   PLT 539 (H) 03/21/2015 1445   MCV 83.5 05/23/2019 0222   MCV 83 03/21/2015 1445   MCH 27.3 05/23/2019 0222   MCHC 32.7 05/23/2019 0222   RDW 17.0 (H) 05/23/2019 0222   RDW 16.0 (H) 03/21/2015 1445   LYMPHSABS 0.6 (L) 05/02/2019 1953   LYMPHSABS 2.3 03/21/2015 1445   MONOABS 0.8 05/02/2019 1953   EOSABS 0.1 05/02/2019 1953   EOSABS 0.0 03/21/2015 1445   BASOSABS 0.0 05/02/2019 1953   BASOSABS 0.0 03/21/2015 1445    BMET    Component Value Date/Time   NA 136 05/24/2019 0749  NA 144 12/12/2018  1434   K 4.3 05/24/2019 0749   CL 93 (L) 05/24/2019 0749   CO2 33 (H) 05/24/2019 0749   GLUCOSE 123 (H) 05/24/2019 0749   BUN 41 (H) 05/24/2019 0749   BUN 17 12/12/2018 1434   CREATININE 0.97 05/24/2019 0749   CREATININE 0.68 05/17/2013 1453   CALCIUM 8.7 (L) 05/24/2019 0749   GFRNONAA >60 05/24/2019 0749   GFRNONAA >89 05/17/2013 1453   GFRAA >60 05/24/2019 0749   GFRAA >89 05/17/2013 1453    BNP    Component Value Date/Time   BNP 325.9 (H) 05/07/2019 1112    ProBNP    Component Value Date/Time   PROBNP 107.3 11/05/2013 1110    Imaging: No results found.   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 FGilles Chiquitoin July    CAP (community acquired pneumonia) - Chronic cough with morning mucus production. Afebrile. - Repeat CXR today and CBC  Obstructive sleep apnea - Maintained on BIPAP, does not appear she is currently following with a provider for management  - Requesting download from [facility] - Patient needs to establish with sleep medicine   Atrial flutter (HCC) - Regular rate and rhythm today - Continue Xarelto 257mdaily   Cellulitis of leg, right - Per patient erythema and swelling of right lower extremity has improve but does appear somewhat significant. No open wounds or drainage. - Checking CBC today and referring to lymphedema clinic   Right heart failure, NYHA class 3 (HCC) - Right heart cath during in-patient hospital stay in May 2021 with Dr. 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

## 2019-07-02 NOTE — Progress Notes (Signed)
Please let patient know CXR showed persistent airspace disease which could be d/t pneumonia. Recommend deep breathing exercises three times a day, use IS if she has one. Please order CT chest wo contrast in 2 weeks.

## 2019-07-02 NOTE — Assessment & Plan Note (Addendum)
-  Right heart cath during in-patient hospital stay in May 2021 with Dr. 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

## 2019-07-02 NOTE — Progress Notes (Signed)
Spoke with patient patient and provided results and instructions per [facility].  She verbalized understanding.

## 2019-07-02 NOTE — Assessment & Plan Note (Addendum)
-  Chronic cough with morning mucus production. Afebrile. - Repeat CXR today and CBC

## 2019-07-02 NOTE — Assessment & Plan Note (Addendum)
-  Mild-moderate dyspnea on exertion  - Right heart cath on 05/14/19 showed mild-moderate PAH (much improved with remodulin)  - Continue IV Remodulin as prescribed per [facility] - Continue sildenafil 66m 1 tablet three times a day  - Patient has follow-up with Dr. FGilles Chiquitoin July

## 2019-07-02 NOTE — Patient Instructions (Addendum)
Pleasure meeting you today [name]  Pulmonary hypertension/heart failure: Continue IV Remodulin Continue oral torsemide 80 mg twice daily Continue oral sildenafil   A. fib Continue Xarelto 20 mg once daily  Respiratory failure Continue 4 L of oxygen 24/7 Continue BiPAP  Referral Lymphedema clinic for extremity swelling/cellulitis  Orders Chest x-ray today Labs today  Follow-up Patient needs establish with new sleep doctor with our office - RECOMMEND IN 4-6 WEEKS  Please follow-up with Dr. Haroldine Laws and Dr. Gilles Chiquito for pulmonary hypertension    Fluid Restriction With some health conditions, you must restrict your fluid intake. This means that you need to limit the amount of fluid that you drink each day (fluid restriction). When you have a fluid restriction, you must carefully measure and keep track of the amount of fluid that you drink. Your health care provider will identify the specific amount of fluid you are allowed each day (fluid allowance). This amount may depend on several things, such as:  How well your kidneys function.  How much fluid you are keeping (retaining) in your body tissues.  Your blood pressure.  Your heart function.  Your blood sodium level. What is my plan? Your health care provider recommends that you limit your fluid intake to ___2,000 ml_______ per day. What counts toward my fluid intake? Your fluid intake includes all liquids that you drink, as well as any foods that become liquid at room temperature. The following are examples of some fluids that you will have to restrict:  Tea, coffee, soda, lemonade, milk, water, juice, sports drinks, and nutritional supplement beverages.  Alcoholic beverages.  Cream.  Gravy.  Ice cubes.  Soup and broth. The following are examples of foods that become liquid at room temperature. These foods will also count toward your fluid intake.  Ice cream and ice milk.  Frozen yogurt and sherbet.  Frozen  ice pops.  Flavored gelatin. How do I keep track of my fluid intake? Each morning, fill a jug with the amount of water that is equal to your daily fluid allowance. You can use this water as a guideline for fluid allowance. Each time you take in any form of fluid (including ice cubes and foods that become liquid at room temperature), pour an equal amount of water out of the container. This helps you to see how much fluid you are taking in. It also helps you to see how much more fluid you can take in during the rest of the day. The following conversions may also be helpful in measuring your fluid intake:  1 cup equals 8 oz (240 mL).   cup equals 6 oz (180 mL).  ? cup equals 5? oz (160 mL).   cup equals 4 oz (120 mL).  ? cup equals 2? oz (80 mL).   cup equals 2 oz (60 mL).  2 Tbsp equals 1 oz (30 mL). What are tips for following this plan? General instructions  Make sure that you stay within your recommended fluid allowance each day. Always measure and keep track of your fluids (including ice cubes and foods that become liquid at room temperature).  Use small cups and glasses and learn to sip fluids slowly.  Try frozen fruits between meals, such as grapes or strawberries. These can satisfy thirst without adding to your fluid intake.  Swallow your pills along with meals or soft foods such as applesauce or mashed potatoes, instead of with liquids. Doing this helps you to save your fluid allowance for something that  you enjoy. Weigh yourself each day     Weigh yourself every day. Keeping track of your daily weight can help you and your health care provider to notice as soon as possible if you are retaining too much fluid in your body.  Follow this sequence every morning: 1. Urinate. 2. Weigh yourself. 3. Eat breakfast.  Wear the same amount of clothing each time you weigh yourself.  Write down your daily weight. Give this weight record to your health care provider. If your  weight is going up, you may be retaining too much fluid. Every 1 lb (0.45 kg) of body weight that you gain is a sign that your body is retaining 2 cups (480 mL) of fluid.  Manage your thirst  Add lemon juice or a slice of fresh lemon to water or ice. Doing this helps to satisfy your thirst.  Freeze fruit juice or water in an ice cube tray. Use this as part of your fluid allowance. These cubes are useful for quenching your thirst. Before you freeze the juice or water, measure how much liquid you use to fill a cube section of the ice tray. Subtract this amount from your day's allowance each time you consume a frozen cube.  Avoid salty (high-sodium) foods. These foods make you thirsty and make it more difficult to stay within your daily fluid allowance.  Keep the temperature in your home at a cooler level.  Keep the air in your home as humid as possible. Dry air increases thirst.  Avoid being out in the hot sun, which can cause you to sweat and become thirsty.  To help avoid dry mouth, brush your teeth often or rinse out your mouth with mouthwash. Lemon wedges, hard sour candies, chewing gum, or breath spray may also help to moisten your mouth. What are some signs that I may be taking in too much fluid? You may be taking in too much fluid if:  Your weight increases. Contact your health care provider if you gain weight rapidly.  Your face, hands, legs, feet, and abdomen start to swell.  You have trouble breathing. Summary  With some health conditions, you must limit (restrict) your fluid intake. This means that you need to limit the amount of fluid you drink each day (fluid restriction). Your health care provider will identify the specific amount of fluid that you are allowed each day.  When you have a fluid restriction, you must carefully measure and keep track of the amount of fluid that you drink.  Your fluid intake includes all liquids that you drink, as well as any foods that become  liquid at room temperature (such as ice cream and gelatin).  You may be taking in too much fluid if your weight increases, your body starts to swell, or you have trouble breathing. This information is not intended to replace advice given to you by your health care provider. Make sure you discuss any questions you have with your health care provider. Document Revised: 04/13/2018 Document Reviewed: 08/25/2016 Elsevier Patient Education  2020 [employer].

## 2019-07-02 NOTE — Assessment & Plan Note (Deleted)
-  Refer to lymphedema clinic

## 2019-07-02 NOTE — Assessment & Plan Note (Signed)
-  Regular rate and rhythm today - Continue Xarelto 51m daily

## 2019-07-02 NOTE — Progress Notes (Signed)
Please let patient know her Potassium was low 3.2. Make sure she is taking potassium supplement, if she taking it as prescribed then recommend additional 58mq tablet for 3 days. Fluid level was normal. Magnesium was normal.

## 2019-07-03 ENCOUNTER — Other Ambulatory Visit: Payer: Self-pay | Admitting: Internal Medicine

## 2019-07-03 DIAGNOSIS — I50810 Right heart failure, unspecified: Secondary | ICD-10-CM

## 2019-07-05 DIAGNOSIS — I5030 Unspecified diastolic (congestive) heart failure: Secondary | ICD-10-CM | POA: Diagnosis not present

## 2019-07-05 DIAGNOSIS — J984 Other disorders of lung: Secondary | ICD-10-CM | POA: Diagnosis not present

## 2019-07-05 DIAGNOSIS — G4730 Sleep apnea, unspecified: Secondary | ICD-10-CM | POA: Diagnosis not present

## 2019-07-05 DIAGNOSIS — I270 Primary pulmonary hypertension: Secondary | ICD-10-CM | POA: Diagnosis not present

## 2019-07-17 DIAGNOSIS — I10 Essential (primary) hypertension: Secondary | ICD-10-CM | POA: Diagnosis not present

## 2019-07-17 DIAGNOSIS — J811 Chronic pulmonary edema: Secondary | ICD-10-CM | POA: Diagnosis not present

## 2019-07-17 DIAGNOSIS — Z6841 Body Mass Index (BMI) 40.0 and over, adult: Secondary | ICD-10-CM | POA: Diagnosis not present

## 2019-07-17 DIAGNOSIS — R0600 Dyspnea, unspecified: Secondary | ICD-10-CM | POA: Diagnosis not present

## 2019-07-17 DIAGNOSIS — J9611 Chronic respiratory failure with hypoxia: Secondary | ICD-10-CM | POA: Diagnosis not present

## 2019-07-17 DIAGNOSIS — Z8619 Personal history of other infectious and parasitic diseases: Secondary | ICD-10-CM | POA: Diagnosis not present

## 2019-07-17 DIAGNOSIS — E119 Type 2 diabetes mellitus without complications: Secondary | ICD-10-CM | POA: Diagnosis not present

## 2019-07-17 DIAGNOSIS — I272 Pulmonary hypertension, unspecified: Secondary | ICD-10-CM | POA: Diagnosis not present

## 2019-07-17 DIAGNOSIS — G4733 Obstructive sleep apnea (adult) (pediatric): Secondary | ICD-10-CM | POA: Diagnosis not present

## 2019-07-17 DIAGNOSIS — G4730 Sleep apnea, unspecified: Secondary | ICD-10-CM | POA: Diagnosis not present

## 2019-07-21 ENCOUNTER — Other Ambulatory Visit: Payer: Self-pay | Admitting: Internal Medicine

## 2019-08-05 DIAGNOSIS — J984 Other disorders of lung: Secondary | ICD-10-CM | POA: Diagnosis not present

## 2019-08-05 DIAGNOSIS — G4730 Sleep apnea, unspecified: Secondary | ICD-10-CM | POA: Diagnosis not present

## 2019-08-05 DIAGNOSIS — I5030 Unspecified diastolic (congestive) heart failure: Secondary | ICD-10-CM | POA: Diagnosis not present

## 2019-08-05 DIAGNOSIS — I270 Primary pulmonary hypertension: Secondary | ICD-10-CM | POA: Diagnosis not present

## 2019-08-11 ENCOUNTER — Encounter (HOSPITAL_COMMUNITY): Payer: Self-pay

## 2019-08-11 ENCOUNTER — Emergency Department (HOSPITAL_COMMUNITY): Payer: Medicaid Other

## 2019-08-11 ENCOUNTER — Emergency Department: Payer: Self-pay

## 2019-08-11 ENCOUNTER — Other Ambulatory Visit: Payer: Self-pay

## 2019-08-11 ENCOUNTER — Observation Stay (HOSPITAL_COMMUNITY)
Admission: EM | Admit: 2019-08-11 | Discharge: 2019-08-12 | Disposition: A | Discharge status: Home / Self Care | Payer: Medicaid Other | Attending: Internal Medicine | Admitting: Internal Medicine

## 2019-08-11 DIAGNOSIS — Z7901 Long term (current) use of anticoagulants: Secondary | ICD-10-CM | POA: Insufficient documentation

## 2019-08-11 DIAGNOSIS — E785 Hyperlipidemia, unspecified: Secondary | ICD-10-CM | POA: Insufficient documentation

## 2019-08-11 DIAGNOSIS — K219 Gastro-esophageal reflux disease without esophagitis: Secondary | ICD-10-CM | POA: Insufficient documentation

## 2019-08-11 DIAGNOSIS — Z79899 Other long term (current) drug therapy: Secondary | ICD-10-CM | POA: Insufficient documentation

## 2019-08-11 DIAGNOSIS — Z7984 Long term (current) use of oral hypoglycemic drugs: Secondary | ICD-10-CM | POA: Insufficient documentation

## 2019-08-11 DIAGNOSIS — Z91040 Latex allergy status: Secondary | ICD-10-CM | POA: Insufficient documentation

## 2019-08-11 DIAGNOSIS — I4892 Unspecified atrial flutter: Secondary | ICD-10-CM | POA: Diagnosis not present

## 2019-08-11 DIAGNOSIS — F79 Unspecified intellectual disabilities: Secondary | ICD-10-CM | POA: Diagnosis not present

## 2019-08-11 DIAGNOSIS — Z886 Allergy status to analgesic agent status: Secondary | ICD-10-CM | POA: Diagnosis not present

## 2019-08-11 DIAGNOSIS — Z6841 Body Mass Index (BMI) 40.0 and over, adult: Secondary | ICD-10-CM | POA: Diagnosis not present

## 2019-08-11 DIAGNOSIS — E669 Obesity, unspecified: Secondary | ICD-10-CM | POA: Insufficient documentation

## 2019-08-11 DIAGNOSIS — D649 Anemia, unspecified: Secondary | ICD-10-CM | POA: Diagnosis not present

## 2019-08-11 DIAGNOSIS — I10 Essential (primary) hypertension: Secondary | ICD-10-CM | POA: Diagnosis not present

## 2019-08-11 DIAGNOSIS — R069 Unspecified abnormalities of breathing: Secondary | ICD-10-CM | POA: Diagnosis not present

## 2019-08-11 DIAGNOSIS — Z888 Allergy status to other drugs, medicaments and biological substances status: Secondary | ICD-10-CM | POA: Diagnosis not present

## 2019-08-11 DIAGNOSIS — I272 Pulmonary hypertension, unspecified: Secondary | ICD-10-CM | POA: Diagnosis not present

## 2019-08-11 DIAGNOSIS — Z452 Encounter for adjustment and management of vascular access device: Secondary | ICD-10-CM | POA: Diagnosis not present

## 2019-08-11 DIAGNOSIS — I5082 Biventricular heart failure: Secondary | ICD-10-CM | POA: Diagnosis not present

## 2019-08-11 DIAGNOSIS — Z20822 Contact with and (suspected) exposure to covid-19: Secondary | ICD-10-CM | POA: Insufficient documentation

## 2019-08-11 DIAGNOSIS — Z885 Allergy status to narcotic agent status: Secondary | ICD-10-CM | POA: Diagnosis not present

## 2019-08-11 DIAGNOSIS — J449 Chronic obstructive pulmonary disease, unspecified: Secondary | ICD-10-CM | POA: Insufficient documentation

## 2019-08-11 DIAGNOSIS — E1140 Type 2 diabetes mellitus with diabetic neuropathy, unspecified: Secondary | ICD-10-CM | POA: Diagnosis not present

## 2019-08-11 DIAGNOSIS — I5030 Unspecified diastolic (congestive) heart failure: Secondary | ICD-10-CM | POA: Diagnosis not present

## 2019-08-11 DIAGNOSIS — G4733 Obstructive sleep apnea (adult) (pediatric): Secondary | ICD-10-CM | POA: Insufficient documentation

## 2019-08-11 DIAGNOSIS — R58 Hemorrhage, not elsewhere classified: Secondary | ICD-10-CM | POA: Diagnosis not present

## 2019-08-11 DIAGNOSIS — I110 Hypertensive heart disease with heart failure: Secondary | ICD-10-CM | POA: Diagnosis not present

## 2019-08-11 DIAGNOSIS — Z882 Allergy status to sulfonamides status: Secondary | ICD-10-CM | POA: Insufficient documentation

## 2019-08-11 DIAGNOSIS — R2243 Localized swelling, mass and lump, lower limb, bilateral: Secondary | ICD-10-CM | POA: Diagnosis not present

## 2019-08-11 DIAGNOSIS — I2721 Secondary pulmonary arterial hypertension: Secondary | ICD-10-CM | POA: Diagnosis present

## 2019-08-11 DIAGNOSIS — R0902 Hypoxemia: Secondary | ICD-10-CM | POA: Diagnosis not present

## 2019-08-11 LAB — CBC WITH DIFFERENTIAL/PLATELET
Abs Immature Granulocytes: 0.05 10*3/uL (ref 0.00–0.07)
Basophils Absolute: 0 10*3/uL (ref 0.0–0.1)
Basophils Relative: 0 %
Eosinophils Absolute: 0 10*3/uL (ref 0.0–0.5)
Eosinophils Relative: 0 %
HCT: 39.9 % (ref 36.0–46.0)
Hemoglobin: 12.4 g/dL (ref 12.0–15.0)
Immature Granulocytes: 0 %
Lymphocytes Relative: 22 %
Lymphs Abs: 2.5 10*3/uL (ref 0.7–4.0)
MCH: 27.1 pg (ref 26.0–34.0)
MCHC: 31.1 g/dL (ref 30.0–36.0)
MCV: 87.3 fL (ref 80.0–100.0)
Monocytes Absolute: 1.3 10*3/uL — ABNORMAL HIGH (ref 0.1–1.0)
Monocytes Relative: 12 %
Neutro Abs: 7.4 10*3/uL (ref 1.7–7.7)
Neutrophils Relative %: 66 %
Platelets: 427 10*3/uL — ABNORMAL HIGH (ref 150–400)
RBC: 4.57 MIL/uL (ref 3.87–5.11)
RDW: 18.7 % — ABNORMAL HIGH (ref 11.5–15.5)
WBC: 11.3 10*3/uL — ABNORMAL HIGH (ref 4.0–10.5)
nRBC: 0 % (ref 0.0–0.2)

## 2019-08-11 LAB — BASIC METABOLIC PANEL
Anion gap: 10 (ref 5–15)
BUN: 10 mg/dL (ref 6–20)
CO2: 28 mmol/L (ref 22–32)
Calcium: 8.6 mg/dL — ABNORMAL LOW (ref 8.9–10.3)
Chloride: 101 mmol/L (ref 98–111)
Creatinine, Ser: 0.69 mg/dL (ref 0.44–1.00)
GFR calc Af Amer: >60 mL/min (ref >60)
GFR calc non Af Amer: >60 mL/min (ref >60)
Glucose, Bld: 88 mg/dL (ref 70–99)
Potassium: 3.9 mmol/L (ref 3.5–5.1)
Sodium: 139 mmol/L (ref 135–145)

## 2019-08-11 LAB — SARS CORONAVIRUS 2 BY RT PCR (HOSPITAL ORDER, PERFORMED IN ~~LOC~~ HOSPITAL LAB): SARS Coronavirus 2: NEGATIVE

## 2019-08-11 MED ORDER — RIVAROXABAN 10 MG PO TABS
20.0000 mg | ORAL_TABLET | Freq: Every day | ORAL | Status: DC
  Filled 2019-08-11: qty 1

## 2019-08-11 MED ORDER — TREPROSTINIL 100 MG/20ML IJ SOLN
81.0000 ng/kg/min | INTRAVENOUS | Status: DC
  Filled 2019-08-11: qty 7

## 2019-08-11 MED ORDER — INSULIN ASPART 100 UNIT/ML ~~LOC~~ SOLN: 0.0000 [IU] | Freq: Three times a day (TID) | SUBCUTANEOUS | Status: DC

## 2019-08-11 MED ORDER — SILDENAFIL CITRATE 20 MG PO TABS
20.0000 mg | ORAL_TABLET | Freq: Three times a day (TID) | ORAL | Status: DC
  Administered 2019-08-12 (×2): 20 mg via ORAL
  Filled 2019-08-11 (×4): qty 1

## 2019-08-11 MED ORDER — DIPHENHYDRAMINE HCL 25 MG PO CAPS
25.0000 mg | ORAL_CAPSULE | Freq: Four times a day (QID) | ORAL | Status: DC | PRN
  Administered 2019-08-12 (×3): 25 mg via ORAL
  Filled 2019-08-11 (×3): qty 1

## 2019-08-11 MED ORDER — FUROSEMIDE 10 MG/ML IJ SOLN
80.0000 mg | Freq: Once | INTRAMUSCULAR | Status: AC
  Administered 2019-08-12: 80 mg via INTRAVENOUS
  Filled 2019-08-11: qty 8

## 2019-08-11 MED ORDER — TREPROSTINIL 100 MG/20ML IJ SOLN
40.0000 ng/kg/min | INTRAVENOUS | Status: AC
  Administered 2019-08-11: 40 ng/kg/min via INTRAVENOUS
  Filled 2019-08-11: qty 7

## 2019-08-11 NOTE — H&P (Signed)
Date: 08/11/2019               Patient Name:  [name] MRN: [id number]  DOB: 10/13/1960 Age / Sex: 59 y.o., female   PCP: [name], MD         Medical Service: Internal Medicine Teaching Service         Attending Physician: Dr. Jimmye Norman, Elaina Pattee, MD    First Contact: Dr. Fatima Sanger Pager: [phone]  Second Contact: Dr. Gilberto Better Pager: [phone]       After Hours (After 5p/  First Contact Pager: [phone]  weekends / holidays): Second Contact Pager: [phone]   Chief Complaint: tunneled central venous catheter fell out  History of Present Illness:  [name] is a 59 y/o F with history of pulmonary hypertension, COPD on 4 L of oxygen by nasal cannula at baseline, OSA on CPAP at night, CHF, anemia, chronic venous stasis ulcers, HTN, atrial flutter on Xarelto presenting for loss of tunneled central venous catheter. She states that the line running from her medication pump caught on a door hinge and it ripped out the catheter. She reports some burning pain which has resolved, no significant bleeding. She requires this line for Remodulin for her pulmonary hypertension. She believes that the line came out in one piece. No CP, palpitations, SOB above baseline, abdominal pain. No other acute complaints today.  Meds:  Current Meds  Medication Sig  . acetaminophen (TYLENOL) 500 MG tablet Take 1 tablet (500 mg total) by mouth every 6 (six) hours as needed. (Patient taking differently: Take 1,000 mg by mouth every 6 (six) hours as needed for headache (pain). )  . cyanocobalamin 500 MCG tablet Take 500 mcg by mouth daily. Vitamin b12  . cycloSPORINE (RESTASIS) 0.05 % ophthalmic emulsion Place 1 drop into both eyes 2 (two) times daily as needed (dry eyes).  . diphenhydrAMINE (BENADRYL) 25 MG tablet Take 50-100 mg by mouth 2 (two) times daily as needed for itching.  Noelle Penner ALLERGY RELIEF, CETIRIZINE, 10 MG tablet Take 1 tablet by mouth once daily (Patient taking differently: Take 10  mg by mouth daily as needed (seasonal allergies). )  . Ferrous Sulfate (IRON PO) Take 2 tablets by mouth every other day. Gummies  . fluticasone (FLONASE) 50 MCG/ACT nasal spray Place 1 spray into both nostrils daily. (Patient taking differently: Place 1 spray into both nostrils daily as needed (seasonal allergies). )  . gabapentin (NEURONTIN) 300 MG capsule TAKE 2 CAPSULES BY MOUTH THREE TIMES DAILY (Patient taking differently: Take 600 mg by mouth 3 (three) times daily. )  . OXYGEN Inhale 4 L into the lungs continuous.  . pantoprazole (PROTONIX) 20 MG tablet Take 1 tablet (20 mg total) by mouth 2 (two) times daily. (Patient taking differently: Take 20 mg by mouth 2 (two) times daily with a meal. )  . potassium chloride SA (KLOR-CON) 20 MEQ tablet Take 2 tablets (40 mEq total) by mouth 3 (three) times daily. (Patient taking differently: Take 40 mEq by mouth 2 (two) times daily. )  . pravastatin (PRAVACHOL) 20 MG tablet Take 1 tablet by mouth once daily (Patient taking differently: Take 20 mg by mouth daily. )  . sildenafil (REVATIO) 20 MG tablet Take 1 tablet (20 mg total) by mouth 3 (three) times daily.  Marland Kitchen spironolactone (ALDACTONE) 25 MG tablet Take 1/2 (one-half) tablet by mouth once daily (Patient taking differently: Take 12.5 mg by mouth daily. )  . torsemide (DEMADEX) 20 MG  tablet Take 4 tablets (80 mg total) by mouth 2 (two) times daily. (Patient taking differently: Take 80 mg by mouth See admin instructions. Take 4 tablets (80 mg) by mouth every morning, may also take 4 tablets (80 mg) in the afternoon as needed for swelling)  . treprostinil (REMODULIN) 5 MG/ML SOLN injection See admin instructions. As of 08/09/2019: Add 7 ml of Remodulin to cassette and 93 ml of sterile diluent for Remodulin to cassetet for a total volume of 100 ml to make a concentration of 350,000 ng per ml.  Infuse via a CADD pump intravenously at a rate of 40 ml per 24 hours. Based on a dosing weight of 120 kg, the dose is 81  ng per kg per min.  Once opened, discard Remodulin vial after 30 days.  Sterile Diluent for Remodulin vials are single use only. Once mixed, discard cassette after 48 hours.  [name] 20 MG TABS tablet TAKE 1 TABLET BY MOUTH ONCE DAILY WITH SUPPER (Patient taking differently: Take 20 mg by mouth daily with supper. )     Allergies: Allergies as of 08/11/2019 - Review Complete 08/11/2019  Allergen Reaction Noted  . Aspirin Swelling 12/09/2005  . Codeine Other (See Comments)   . Lisinopril Cough 05/02/2012  . Sulfonamide derivatives Swelling 10/19/2005  . Latex Rash 01/07/2016   Past Medical History:  Diagnosis Date  . Anemia, iron deficiency    secondary to menhorrhagia, on oral iron, also b12 def, getting monthly b12 shots  . Cellulitis 05/2019   RIGHT LOWER EXTREMITY  . CHF (congestive heart failure) ([place])   . Chronic cough    secondary to alleriges and post nasal drip  . Cognitive impairment   . COPD (chronic obstructive pulmonary disease) ([place])   . Cor pulmonale (HCC)    PA Peak pressure 72mHg  . Depression   . Diabetes mellitus    well controlled on metformin  . Diastolic heart failure ([place]   . GERD (gastroesophageal reflux disease)   . H/O mental retardation   . Herpes   . Hyperlipidemia   . Hypertension   . OSA (obstructive sleep apnea)    CPAP  . [place]monary [place]   . Renal disorder   . Restrictive lung disease    PFTs 06/2012 (FVC 54% predicted and FEV1 68% predicted w minimal bronchodilator response).  . Shortness of breath   . Venous stasis ulcer (HCC)    chornic, ?followed up at wound care center, multiple courses of antibiotics in past for cellulitis, on lasix    Family History:  Family History  Problem Relation Age of Onset  . Kidney Stones Son   . Mental illness Sister   . Bipolar disorder Sister   . Mental retardation Brother   . Hyperlipidemia Mother   . Breast cancer Maternal Aunt      Social History:  Social History   Tobacco  Use  . Smoking status: Never Smoker  . Smokeless tobacco: Never Used  Vaping Use  . Vaping Use: Never used  Substance Use Topics  . Alcohol use: No    Alcohol/week: 0.0 standard drinks  . Drug use: No     Review of Systems: Review of Systems  Constitutional: Negative for chills and fever.  HENT: Negative for ear pain and hearing loss.   Eyes: Negative for blurred vision and double vision.  Respiratory: Positive for cough (baseline). Negative for hemoptysis, sputum production and shortness of breath.   Cardiovascular: Negative for chest pain, palpitations and  leg swelling (baseline).  Gastrointestinal: Positive for vomiting (in the mornings). Negative for abdominal pain.  Musculoskeletal: Positive for joint pain (chronic). Negative for falls and myalgias.  Skin: Negative for itching and rash.  Neurological: Negative for tingling and headaches.  Psychiatric/Behavioral: Negative for depression and suicidal ideas. The patient is not nervous/anxious.     Physical Exam: Blood pressure 117/71, pulse 75, temperature 98.9 F (37.2 C), temperature source Oral, resp. rate (!) 24, height _0  (1.6 m), weight 125.6 kg, last menstrual period 02/19/2011, SpO2 92 %. Physical Exam Vitals and nursing note reviewed.  Constitutional:      Appearance: She is obese.  HENT:     Head: Normocephalic and atraumatic.     Right Ear: External ear normal.     Left Ear: External ear normal.     Nose: Nose normal.     Mouth/Throat:     Mouth: Mucous membranes are moist.  Eyes:     General:        Right eye: Discharge present.        Left eye: Discharge present.    Extraocular Movements: Extraocular movements intact.     Comments: Periorbital edema inferior to bilateral eyes  Cardiovascular:     Rate and Rhythm: Normal rate and regular rhythm.     Heart sounds: Normal heart sounds. No murmur heard.  No friction rub. No gallop.      Comments: Bilateral LE with minimal pitting edema Extensive venous  stasis skin changes to bilateral LE Pulmonary:     Effort: Pulmonary effort is normal. No respiratory distress.     Breath sounds: No stridor. Wheezing (mild wheezing in R lower lung fields) and rales (mild crackles in L lower lung fields) present. No rhonchi.  Abdominal:     General: Bowel sounds are normal. There is distension (appears protuberant, patient states possibly mildly distended compared to usual).     Palpations: Abdomen is soft.     Tenderness: There is no abdominal tenderness. There is no guarding.  Musculoskeletal:        General: No signs of injury.     Cervical back: Normal range of motion.  Skin:    General: Skin is warm and dry.     Findings: Rash (crusting rash over bilateral eyebrows, scattered small hypopigmented areas to bilareal forearms) present.     Comments: Clear adhesive and biopatch over R upper chest at site of former tunneled line, site is clean and without blood or sign of infection  Neurological:     General: No focal deficit present.     Mental Status: She is alert.  Psychiatric:        Mood and Affect: Mood normal.        Behavior: Behavior normal.   Dual lumen line, cut to 20cm, line is smooth without evidence of damage     EKG: personally reviewed my interpretation is sinus rhythm with prolonged PR interval which was present on prior EKG  Assessment & Plan by Problem: Active Problems:   Pulmonary hypertension ([place])  Ms. Riveron is a 59 y/o F with hx of pulmonary hypertension, COPD on 4 L of oxygen by nasal cannula at baseline, OSA on CPAP at night, CHF presenting for lost tunneled central venous catheter, to be placed by IR in the morning. No other acute complaints.   Pulmonary HTN in the setting of lost central line required for Remodulin administration Pulmonary HTN managed at [facility]. This double-lumen tunneled central venous catheter was  placed by IR here in May 2021. It was cut to 20cm, consistent with the line that she brought with her. Line  appears undamaged. She requires continuous administration of Remodulin for her pulmonary HTN through a central line. Unable to get PICC for use until AM, but per pharmacy can receive this via PIV overnight but should have central access for long term use.  -IR to place tunneled central line in AM -NPO for procedure -continue Remodulin at 1.67m/hr     -was started at 1/2 home dose since she was off it for 8 hours but now titrated back to home dose -continue home sildinafil 230mTID  R heart failure, NYHA class 3 Last echo on 4/5/6153ith RV systolic function moderately reduced. LVEF 65-70%. Patient denies recent exacerbation of symptoms. Did not take her torsemide today because she woke late and then had the events that brought her to the ED. On exam has minimal pitting edema and mild crackles in L lower lung fields. States her dry weight is around 269lb, weighed 276 on admission.  -Lasix 8015mV in AM -strict I&Os -daily weights -Mg and phos level in AM -may resume home torsemide after procedure, 15m29mD  Leukocytosis Wbc of 11.3. No fever or other signs of infections -trend CBC -monitor for signs of infection  Hx A flutter Currently in normal sinus rhythm with increased PR interval present on prior EKGs. -continue Xarelto 20mg88mly  DM type II On metformin at home. Last A1c 5.5. -start sliding scale insulin  Obstructive sleep apnea On CPAP at night. Last documented setting in 2018 of 16-20, 3L O2. -start CPAP  Dispo: Admit patient to Observation with expected length of stay less than 2 midnights.  Signed: Merita Hawks,Andrew Au8/07/2019, 11:13 PM  Pager: [phone] 5pm on weekdays and 1pm on weekends: On Call pager: [phone]

## 2019-08-11 NOTE — ED Provider Notes (Signed)
[place] EMERGENCY DEPARTMENT Provider Note   CSN: [id number] Arrival date & time: 08/11/19  1443     History Chief Complaint  Patient presents with  . Vascular Access Problem    [name] is a 59 y.o. female with a past medical history of COPD on 4 L of oxygen by nasal cannula at baseline, CHF, anemia, pulmonary hypertension, chronic venous stasis ulcers presenting to the ED for PICC line falling out.  States that the catheter of her PICC line fell out about 2 hours ago.  She uses PICC line for continuous infusion of her medication for pulmonary hypertension.  She denies any other complaints other than feeling hungry.  Denies any chest pain, shortness of breath, worsening leg swelling, vomiting, abdominal pain.  HPI     Past Medical History:  Diagnosis Date  . Anemia, iron deficiency    secondary to menhorrhagia, on oral iron, also b12 def, getting monthly b12 shots  . Cellulitis 05/2019   RIGHT LOWER EXTREMITY  . CHF (congestive heart failure) ([place])   . Chronic cough    secondary to alleriges and post nasal drip  . Cognitive impairment   . COPD (chronic obstructive pulmonary disease) ([place])   . Cor pulmonale (HCC)    PA Peak pressure 18mHg  . Depression   . Diabetes mellitus    well controlled on metformin  . Diastolic heart failure ([place]   . GERD (gastroesophageal reflux disease)   . H/O mental retardation   . Herpes   . Hyperlipidemia   . Hypertension   . OSA (obstructive sleep apnea)    CPAP  . Pulmonary hypertension ([place]   . Renal disorder   . Restrictive lung disease    PFTs 06/2012 (FVC 54% predicted and FEV1 68% predicted w minimal bronchodilator response).  . Shortness of breath   . Venous stasis ulcer ([place]    chornic, ?followed up at wound care center, multiple courses of antibiotics in past for cellulitis, on lasix    Patient Active Problem List   Diagnosis Date Noted  . Cellulitis of leg, right 05/07/2019  .  Streptococcal bacteremia 05/07/2019  . Chronic venous stasis 05/07/2019  . Elevated troponin 05/03/2019  . Sepsis ([place]   . Urinary incontinence 12/12/2018  . Atelectasis   . 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 Acute on chronic respiratory failure ([place] 06/15/2018  . CAP (community acquired pneumonia) 06/15/2018  . Abdominal muscle strain, initial encounter 06/07/2018  . Ovarian cyst 05/19/2017  . Postmenopausal vaginal bleeding 03/21/2015  . Rash 10/31/2014  . Peripheral neuropathy ([place] 09/27/2014  . Healthcare maintenance 09/27/2014  . Acute on chronic diastolic (congestive) heart failure ([place] 06/25/2013  . Atrial flutter ([place] 08/17/2012  . Pulmonary hypertension ([place] 08/16/2012  . Shock ([place] 07/13/2012  . Right heart failure, NYHA class 3 ([place] 03/15/2012  . Allergic rhinitis 11/16/2011  . Diabetes (HT[place] 10/15/2010  . Hyperlipidemia 10/15/2010  . Obesity 10/14/2010  . Obstructive sleep apnea 02/21/2009  . GERD 11/29/2005  . ANEMIA, B12 DEFICIENCY 10/19/2005    Past Surgical History:  Procedure Laterality Date  . CARDIAC CATHETERIZATION N/A 01/13/2016   Procedure: Right Heart Cath;  Surgeon: DJolaine Artist MD;  [facility];  Service: Cardiovascular;  Laterality: N/A;  . CHOLECYSTECTOMY    . IR FLUORO GUIDE CV LINE RIGHT  05/09/2019  . IR REMOVAL TUN CV CATH W/O FL  05/05/2019  . IR [place] ACCESS  RIGHT  05/09/2019  . LEFT AND RIGHT HEART CATHETERIZATION WITH CORONARY ANGIOGRAM N/A 02/28/2014   Procedure: LEFT AND RIGHT HEART CATHETERIZATION WITH CORONARY ANGIOGRAM;  Surgeon: Jolaine Artist, MD;  Location: [facility] CATH LAB;  Service: Cardiovascular;  Laterality: N/A;  . RIGHT HEART CATH N/A 06/27/2018   Procedure: RIGHT HEART CATH;  Surgeon: Jolaine Artist, MD;  Location: [place] CV LAB;  Service: Cardiovascular;  Laterality: N/A;  . RIGHT/LEFT HEART CATH AND CORONARY ANGIOGRAPHY N/A 05/14/2019   Procedure:  RIGHT/LEFT HEART CATH AND CORONARY ANGIOGRAPHY;  Surgeon: Jolaine Artist, MD;  Location: [place] CV LAB;  Service: Cardiovascular;  Laterality: N/A;     OB History    Gravida  1   Para  1   Term  1   Preterm      AB      Living  1     SAB      TAB      Ectopic      Multiple      Live Births              Family History  Problem Relation Age of Onset  . Kidney Stones Son   . Mental illness Sister   . Bipolar disorder Sister   . Mental retardation Brother   . Hyperlipidemia Mother   . Breast cancer Maternal Aunt     Social History   Tobacco Use  . Smoking status: Never Smoker  . Smokeless tobacco: Never Used  Vaping Use  . Vaping Use: Never used  Substance Use Topics  . Alcohol use: No    Alcohol/week: 0.0 standard drinks  . Drug use: No    Home Medications Prior to Admission medications   Medication Sig Start Date End Date Taking? Authorizing Provider  acetaminophen (TYLENOL) 500 MG tablet Take 1 tablet (500 mg total) by mouth every 6 (six) hours as needed. Patient taking differently: Take 1,000 mg by mouth every 6 (six) hours as needed for headache (pain).  08/21/15  Yes [name], MD  cyanocobalamin 500 MCG tablet Take 500 mcg by mouth daily. Vitamin b12   Yes [provider]  cycloSPORINE (RESTASIS) 0.05 % ophthalmic emulsion Place 1 drop into both eyes 2 (two) times daily as needed (dry eyes).   Yes [provider]  diphenhydrAMINE (BENADRYL) 25 MG tablet Take 50-100 mg by mouth 2 (two) times daily as needed for itching.   Yes [provider]  EQ ALLERGY RELIEF, CETIRIZINE, 10 MG tablet Take 1 tablet by mouth once daily Patient taking differently: Take 10 mg by mouth daily as needed (seasonal allergies).  06/18/19  Yes [name], MD  Ferrous Sulfate (IRON PO) Take 2 tablets by mouth every other day. Gummies   Yes [provider]  fluticasone (FLONASE) 50 MCG/ACT nasal spray Place 1 spray into  both nostrils daily. Patient taking differently: Place 1 spray into both nostrils daily as needed (seasonal allergies).  01/05/18  Yes Mosetta Anis, MD  gabapentin (NEURONTIN) 300 MG capsule TAKE 2 CAPSULES BY MOUTH THREE TIMES DAILY Patient taking differently: Take 600 mg by mouth 3 (three) times daily.  07/23/19  Yes Marianna Payment, MD  OXYGEN Inhale 4 L into the lungs continuous.   Yes [provider]  pantoprazole (PROTONIX) 20 MG tablet Take 1 tablet (20 mg total) by mouth 2 (two) times daily. Patient taking differently: Take 20 mg by mouth 2 (two) times daily with a meal.  02/28/19  Yes [name], MD  potassium chloride SA (KLOR-CON) 20 MEQ tablet Take 2 tablets (40 mEq total) by mouth 3 (three) times daily. Patient taking differently: Take 40 mEq by mouth 2 (two) times daily.  05/23/19  Yes Hosie Poisson, MD  pravastatin (PRAVACHOL) 20 MG tablet Take 1 tablet by mouth once daily Patient taking differently: Take 20 mg by mouth daily.  08/08/18  Yes [name], MD  sildenafil (REVATIO) 20 MG tablet Take 1 tablet (20 mg total) by mouth 3 (three) times daily. 01/17/19  Yes Bensimhon, [name], MD  spironolactone (ALDACTONE) 25 MG tablet Take 1/2 (one-half) tablet by mouth once daily Patient taking differently: Take 12.5 mg by mouth daily.  07/04/19  Yes Oda Kilts, MD  torsemide (DEMADEX) 20 MG tablet Take 4 tablets (80 mg total) by mouth 2 (two) times daily. Patient taking differently: Take 80 mg by mouth See admin instructions. Take 4 tablets (80 mg) by mouth every morning, may also take 4 tablets (80 mg) in the afternoon as needed for swelling 05/23/19  Yes Hosie Poisson, MD  treprostinil (REMODULIN) 5 MG/ML SOLN injection See admin instructions. As of 08/09/2019: Add 7 ml of Remodulin to cassette and 93 ml of sterile diluent for Remodulin to cassetet for a total volume of 100 ml to make a concentration of 350,000 ng per ml.  Infuse via a CADD pump intravenously at a  rate of 40 ml per 24 hours. Based on a dosing weight of 120 kg, the dose is 81 ng per kg per min.  Once opened, discard Remodulin vial after 30 days.  Sterile Diluent for Remodulin vials are single use only. Once mixed, discard cassette after 48 hours.   Yes [provider]  XARELTO 20 MG TABS tablet TAKE 1 TABLET BY MOUTH ONCE DAILY WITH SUPPER Patient taking differently: Take 20 mg by mouth daily with supper.  03/07/19  Yes [name], MD  ACCU-CHEK AVIVA PLUS test strip USE 1 STRIP TO CHECK GLUCOSE ONCE DAILY 05/25/18   [name], MD  docusate sodium (COLACE) 100 MG capsule Take 1 capsule (100 mg total) by mouth 2 (two) times daily as needed for mild constipation. Patient not taking: Reported on 08/11/2019 05/23/19   Hosie Poisson, MD  ferrous sulfate 325 (65 FE) MG tablet Take 1 tablet (325 mg total) by mouth daily with breakfast. Patient not taking: Reported on 08/11/2019 07/23/14   Corky Sox, MD  metFORMIN (GLUCOPHAGE) 500 MG tablet TAKE 1/2 (ONE-HALF) TABLET BY MOUTH WITH BREAKFAST Patient not taking: Reported on 08/11/2019 06/07/19   [name], MD  polyethylene glycol (MIRALAX / GLYCOLAX) 17 g packet Take 17 g by mouth daily as needed for moderate constipation. Patient not taking: Reported on 08/11/2019 05/23/19   Hosie Poisson, MD  cetirizine (ZYRTEC) 10 MG tablet Take 1 tablet by mouth once daily 07/24/18   [name], MD    Allergies    Aspirin, Codeine, Lisinopril, Sulfonamide derivatives, and Latex  Review of Systems   Review of Systems  Constitutional: Negative for appetite change, chills and fever.  HENT: Negative for ear pain, rhinorrhea, sneezing and sore throat.   Eyes: Negative for photophobia and visual disturbance.  Respiratory: Negative for cough, chest tightness, shortness of breath and wheezing.   Cardiovascular: Negative for chest pain and palpitations.  Gastrointestinal: Negative for abdominal pain, blood in stool, constipation,  diarrhea, nausea and vomiting.  Genitourinary: Negative for dysuria, hematuria and urgency.  Musculoskeletal:  Negative for myalgias.  Skin: Negative for rash.  Neurological: Negative for dizziness, weakness and light-headedness.    Physical Exam Updated Vital Signs BP 124/72   Pulse 80   Temp 98.9 F (37.2 C) (Oral)   Resp (!) 21   Ht _0  (1.6 m)   Wt 125.6 kg   LMP 02/19/2011   SpO2 94%   BMI 49.07 kg/m   Physical Exam Vitals and nursing note reviewed.  Constitutional:      General: She is not in acute distress.    Appearance: She is well-developed.  HENT:     Head: Normocephalic and atraumatic.     Nose: Nose normal.  Eyes:     General: No scleral icterus.       Left eye: No discharge.     Conjunctiva/sclera: Conjunctivae normal.  Cardiovascular:     Rate and Rhythm: Normal rate and regular rhythm.     Heart sounds: Normal heart sounds. No murmur heard.  No friction rub. No gallop.   Pulmonary:     Effort: Pulmonary effort is normal. No respiratory distress.     Breath sounds: Normal breath sounds.  Abdominal:     General: Bowel sounds are normal. There is no distension.     Palpations: Abdomen is soft.     Tenderness: There is no abdominal tenderness. There is no guarding.  Musculoskeletal:        General: Normal range of motion.     Cervical back: Normal range of motion and neck supple.     Right lower leg: Edema present.     Left lower leg: Edema present.  Skin:    General: Skin is warm and dry.     Findings: Erythema present. No rash.     Comments: Chronic venous stasis ulcers noted bilaterally.  Neurological:     Mental Status: She is alert.     Motor: No abnormal muscle tone.     Coordination: Coordination normal.     ED Results / Procedures / Treatments   Labs (all labs ordered are listed, but only abnormal results are displayed) Labs Reviewed  BASIC METABOLIC PANEL - Abnormal; Notable for the following components:      Result Value    Calcium 8.6 (*)    All other components within normal limits  CBC WITH DIFFERENTIAL/PLATELET - Abnormal; Notable for the following components:   WBC 11.3 (*)    RDW 18.7 (*)    Platelets 427 (*)    Monocytes Absolute 1.3 (*)    All other components within normal limits  SARS CORONAVIRUS 2 BY RT PCR (HOSPITAL ORDER, [facility])    EKG None  Radiology [place] EKG SITE RITE  Result Date: 08/11/2019 If Site Rite image not attached, placement could not be confirmed due to current cardiac rhythm.   Procedures Procedures (including critical care time)  Medications Ordered in ED Medications  treprostinil (REMODULIN) 35,000,000 ng in sodium chloride 0.9 % 100 mL (350,000 ng/mL) infusion (has no administration in time range)  treprostinil (REMODULIN) 35,000,000 ng in sodium chloride 0.9 % 100 mL (350,000 ng/mL) infusion (40 ng/kg/min  120 kg (Order-Specific) Intravenous New Bag/Given 08/11/19 2138)  sildenafil (REVATIO) tablet 20 mg (has no administration in time range)  diphenhydrAMINE (BENADRYL) capsule 25 mg (has no administration in time range)    ED Course  I have reviewed the triage vital signs and the nursing notes.  Pertinent labs & imaging results that were available during my  care of the patient were reviewed by me and considered in my medical decision making (see chart for details).  Clinical Course as of Aug 10 2208  Sat Aug 11, 2019  2007 IV team was unable to replace the port.  They state that since IR placed it, they will need to be consulted for replacement.  I did talk to Dr. Anselm Pancoast, radiologist, he is currently in a another procedure.  He does not think that this can be done ASAP due to other procedure that he is in.  In the meantime we will infuse this through her IV, pharmacist is aware of this and states that this is okay.  We will try to get this in-house but I have also called her son to have her supply brought from home to begin infusion as soon  as possible.  She remains in no acute distress.   [HK]    Clinical Course User Index [HK] Delia Heady, PA-C   MDM Rules/Calculators/A&P                          59 year old female with a past medical history of pulmonary hypertension, COPD, on 4 L of oxygen via nasal cannula at baseline, anemia presenting to the ED for complaints of PICC line falling out.  States that she gets her 24 7 infusions of Remodulin through this line.  The catheter fell out today prior to arrival.  States that she otherwise feels okay, denies any chest pain, shortness of breath, injuries or falls, vomiting.  On exam patient appears at baseline.  Chronic bilateral lower extremity edema and venous stasis ulcers noted.  She states this not different than her baseline.  Unfortunately IV team was unable to place this PICC line as it is tunneled and IR placed it.  After making several phone calls and consulting the pharmacist, we can infuse this medication through an IV in her right AC.  Pharmacist also spoke to Dr. Haroldine Laws to verify this and this can be done but cannot be a permanent solution.  I reconsulted Dr. Anselm Pancoast, radiologist, as she will need to have this [place] catheter placed first thing in the morning.  States that he will be able to do this.  I have admitted the patient to internal medicine service. Appreciate the help of pharmacy, radiology, cardiology and internal medicine for management of this patient.   Portions of this note were generated with Lobbyist. Dictation errors may occur despite best attempts at proofreading.   Final Clinical Impression(s) / ED Diagnoses Final diagnoses:  Needs peripherally inserted central catheter (PICC)  Pulmonary hypertension [facility])    Rx / DC Orders ED Discharge Orders    None       Delia Heady, PA-C 08/11/19 2214    Wyvonnia Dusky, MD 08/12/19 [phone]

## 2019-08-11 NOTE — ED Triage Notes (Signed)
Pt BIB [facility] reports her PICC line fell out x2 hours ago, she brought it with her, she needs it to receive continuous injection of 55m/ml Treprostinil for pulmonary hypertension. Son reports a 5lb weight gain, wears 4LNC at home  BP 102/72 HR 84 RR 20 94%% 4L [place]  CBG 121 Temp 97.5

## 2019-08-11 NOTE — Progress Notes (Signed)
In Progress  08/11/19 7:22 PM [place], Vida Roller, RN  Consult placed to insert new PICC line as patient's had fallen out at home. Line needed for home antibiotics. Spoke with the primary RN and informed her that the PICC was placed by IR. Primary RN to speak with provider.

## 2019-08-12 ENCOUNTER — Encounter (HOSPITAL_COMMUNITY): Payer: Self-pay | Admitting: Internal Medicine

## 2019-08-12 ENCOUNTER — Observation Stay (HOSPITAL_COMMUNITY): Payer: Medicaid Other

## 2019-08-12 DIAGNOSIS — I272 Pulmonary hypertension, unspecified: Secondary | ICD-10-CM | POA: Diagnosis not present

## 2019-08-12 DIAGNOSIS — Z452 Encounter for adjustment and management of vascular access device: Secondary | ICD-10-CM | POA: Diagnosis not present

## 2019-08-12 HISTORY — PX: IR US GUIDE VASC ACCESS RIGHT: IMG2390

## 2019-08-12 HISTORY — PX: IR FLUORO GUIDE CV LINE RIGHT: IMG2283

## 2019-08-12 LAB — BASIC METABOLIC PANEL
Anion gap: 9 (ref 5–15)
BUN: 6 mg/dL (ref 6–20)
CO2: 28 mmol/L (ref 22–32)
Calcium: 8.7 mg/dL — ABNORMAL LOW (ref 8.9–10.3)
Chloride: 102 mmol/L (ref 98–111)
Creatinine, Ser: 0.59 mg/dL (ref 0.44–1.00)
GFR calc Af Amer: >60 mL/min (ref >60)
GFR calc non Af Amer: >60 mL/min (ref >60)
Glucose, Bld: 86 mg/dL (ref 70–99)
Potassium: 3.2 mmol/L — ABNORMAL LOW (ref 3.5–5.1)
Sodium: 139 mmol/L (ref 135–145)

## 2019-08-12 LAB — CBC WITH DIFFERENTIAL/PLATELET
Abs Immature Granulocytes: 0.07 10*3/uL (ref 0.00–0.07)
Basophils Absolute: 0 10*3/uL (ref 0.0–0.1)
Basophils Relative: 0 %
Eosinophils Absolute: 0 10*3/uL (ref 0.0–0.5)
Eosinophils Relative: 0 %
HCT: 41.5 % (ref 36.0–46.0)
Hemoglobin: 13.2 g/dL (ref 12.0–15.0)
Immature Granulocytes: 1 %
Lymphocytes Relative: 28 %
Lymphs Abs: 3.1 10*3/uL (ref 0.7–4.0)
MCH: 27.6 pg (ref 26.0–34.0)
MCHC: 31.8 g/dL (ref 30.0–36.0)
MCV: 86.6 fL (ref 80.0–100.0)
Monocytes Absolute: 1.3 10*3/uL — ABNORMAL HIGH (ref 0.1–1.0)
Monocytes Relative: 12 %
Neutro Abs: 6.3 10*3/uL (ref 1.7–7.7)
Neutrophils Relative %: 59 %
Platelets: 479 10*3/uL — ABNORMAL HIGH (ref 150–400)
RBC: 4.79 MIL/uL (ref 3.87–5.11)
RDW: 18.9 % — ABNORMAL HIGH (ref 11.5–15.5)
WBC: 10.8 10*3/uL — ABNORMAL HIGH (ref 4.0–10.5)
nRBC: 0 % (ref 0.0–0.2)

## 2019-08-12 LAB — GLUCOSE, CAPILLARY
Glucose-Capillary: 82 mg/dL (ref 70–99)
Glucose-Capillary: 95 mg/dL (ref 70–99)
Glucose-Capillary: 93 mg/dL (ref 70–99)

## 2019-08-12 LAB — MAGNESIUM: Magnesium: 2 mg/dL (ref 1.7–2.4)

## 2019-08-12 LAB — PHOSPHORUS: Phosphorus: 3.4 mg/dL (ref 2.5–4.6)

## 2019-08-12 MED ORDER — CHLORHEXIDINE GLUCONATE CLOTH 2 % EX PADS: 6.0000 | MEDICATED_PAD | Freq: Every day | CUTANEOUS | Status: DC

## 2019-08-12 MED ORDER — HEPARIN SOD (PORK) LOCK FLUSH 100 UNIT/ML IV SOLN
INTRAVENOUS | Status: AC
  Filled 2019-08-12: qty 5

## 2019-08-12 MED ORDER — POTASSIUM CHLORIDE CRYS ER 20 MEQ PO TBCR
40.0000 meq | EXTENDED_RELEASE_TABLET | Freq: Once | ORAL | Status: AC
  Administered 2019-08-12: 40 meq via ORAL
  Filled 2019-08-12: qty 2

## 2019-08-12 MED ORDER — IOHEXOL 300 MG/ML  SOLN
50.0000 mL | Freq: Once | INTRAMUSCULAR | Status: AC | PRN
  Administered 2019-08-12: 5 mL via INTRAVENOUS

## 2019-08-12 MED ORDER — LIDOCAINE HCL 1 % IJ SOLN
INTRAMUSCULAR | Status: DC | PRN
  Administered 2019-08-12: 15 mL

## 2019-08-12 MED ORDER — LIDOCAINE HCL 1 % IJ SOLN
INTRAMUSCULAR | Status: AC
  Filled 2019-08-12: qty 20

## 2019-08-12 MED ORDER — CEFAZOLIN SODIUM-DEXTROSE 2-4 GM/100ML-% IV SOLN
2.0000 g | INTRAVENOUS | Status: AC
  Administered 2019-08-12: 2 g via INTRAVENOUS
  Filled 2019-08-12: qty 100

## 2019-08-12 NOTE — Progress Notes (Signed)
Ancef was not given before procedure. MD Anselm Pancoast states that I should given the Ancef. I will administer the ancef as soon as it arrives from the pharmacy.

## 2019-08-12 NOTE — Procedures (Signed)
Interventional Radiology Procedure:   Indications: Pulmonary hypertension and lost central venous access for continuous medication infusion  Procedure: Placement of tunneled central line  Findings:  Right jugular Powerline, tip at SVC/RA junction  Complications: None     EBL: less than 10 ml  Plan: Tunneled central line is ready to use.  Will give single dose of antibiotic.   Arantxa Piercey R. Anselm Pancoast, MD  Pager: [phone]

## 2019-08-12 NOTE — Discharge Summary (Addendum)
Name: [name] MRN: [id number] DOB: May 15, 1960 59 y.o. PCP: [name], MD  Date of Admission: 08/11/2019  3:00 PM Date of Discharge: 08/12/2019 Attending Physician: Angelica Pou, MD  Discharge Diagnosis: 1. Dislodged Central Venous Catheter  Discharge Medications: Allergies as of 08/12/2019      Reactions   Aspirin Swelling   REACTION: airway swelling   Codeine Other (See Comments)   REACTION: tingling in lips and hard breathing - had reaction at dentist - states "I can't take certain kinds of codeine" - happened maybe 10 yr ago   Lisinopril Cough   Sulfonamide Derivatives Swelling   REACTION: airway swelling   Latex Rash      Medication List    STOP taking these medications   ferrous sulfate 325 (65 FE) MG tablet     TAKE these medications   Accu-Chek Aviva Plus test strip Generic drug: glucose blood USE 1 STRIP TO CHECK GLUCOSE ONCE DAILY   acetaminophen 500 MG tablet Commonly known as: TYLENOL Take 1 tablet (500 mg total) by mouth every 6 (six) hours as needed. What changed:   how much to take  reasons to take this   cycloSPORINE 0.05 % ophthalmic emulsion Commonly known as: RESTASIS Place 1 drop into both eyes 2 (two) times daily as needed (dry eyes).   diphenhydrAMINE 25 MG tablet Commonly known as: BENADRYL Take 50-100 mg by mouth 2 (two) times daily as needed for itching.   docusate sodium 100 MG capsule Commonly known as: COLACE Take 1 capsule (100 mg total) by mouth 2 (two) times daily as needed for mild constipation.   EQ Allergy Relief (Cetirizine) 10 MG tablet Generic drug: cetirizine Take 1 tablet by mouth once daily What changed:   how much to take  when to take this  reasons to take this   fluticasone 50 MCG/ACT nasal spray Commonly known as: Flonase Place 1 spray into both nostrils daily. What changed:   when to take this  reasons to take this   gabapentin 300 MG capsule Commonly known as: NEURONTIN TAKE  2 CAPSULES BY MOUTH THREE TIMES DAILY   IRON PO Take 2 tablets by mouth every other day. Gummies   metFORMIN 500 MG tablet Commonly known as: GLUCOPHAGE TAKE 1/2 (ONE-HALF) TABLET BY MOUTH WITH BREAKFAST   OXYGEN Inhale 4 L into the lungs continuous.   pantoprazole 20 MG tablet Commonly known as: PROTONIX Take 1 tablet (20 mg total) by mouth 2 (two) times daily. What changed: when to take this   polyethylene glycol 17 g packet Commonly known as: MIRALAX / GLYCOLAX Take 17 g by mouth daily as needed for moderate constipation.   potassium chloride SA 20 MEQ tablet Commonly known as: KLOR-CON Take 2 tablets (40 mEq total) by mouth 3 (three) times daily. What changed: when to take this   pravastatin 20 MG tablet Commonly known as: PRAVACHOL Take 1 tablet by mouth once daily   sildenafil 20 MG tablet Commonly known as: REVATIO Take 1 tablet (20 mg total) by mouth 3 (three) times daily.   spironolactone 25 MG tablet Commonly known as: ALDACTONE Take 1/2 (one-half) tablet by mouth once daily What changed: See the new instructions.   torsemide 20 MG tablet Commonly known as: DEMADEX Take 4 tablets (80 mg total) by mouth 2 (two) times daily. What changed:   when to take this  additional instructions   treprostinil 5 MG/ML Soln injection Commonly known as: REMODULIN See admin instructions. As of  08/09/2019: Add 7 ml of Remodulin to cassette and 93 ml of sterile diluent for Remodulin to cassetet for a total volume of 100 ml to make a concentration of 350,000 ng per ml.  Infuse via a CADD pump intravenously at a rate of 40 ml per 24 hours. Based on a dosing weight of 120 kg, the dose is 81 ng per kg per min.  Once opened, discard Remodulin vial after 30 days.  Sterile Diluent for Remodulin vials are single use only. Once mixed, discard cassette after 48 hours.   vitamin B-12 500 MCG tablet Commonly known as: CYANOCOBALAMIN Take 500 mcg by mouth daily. Vitamin b12   Xarelto  20 MG Tabs tablet Generic drug: rivaroxaban TAKE 1 TABLET BY MOUTH ONCE DAILY WITH SUPPER What changed: See the new instructions.       Disposition and follow-up:   [name] was discharged from [facility] in Stable condition.  At the hospital follow up visit please address:  1.  Dislodged Central Venous Catheter: Make sure she has a pouch to carry her pump in. She reports that her previous pouch had broken so she was carrying her pump around in her dress pocket which was less protected.  2.  Labs / imaging needed at time of follow-up: BMP, CBC  3.  Pending labs/ test needing follow-up: none  Follow-up Appointments:  Follow-up Information    [facility]. Call.   Contact information: [address] [facility] Course by problem list: 1. Dislodged Central Venous Catheter: While walking around her house she caught her medication pump tubbing on a door hinge which ripped the catheter out. She came to the ED and was admitted until a new line could be placed. She was taken to the IR suite and a new line was placed.  Discharge Vitals:   BP 120/68 (BP Location: Left Arm)   Pulse 80   Temp 98.6 F (37 C) (Oral)   Resp 19   Ht _0  (1.6 m)   Wt 125.6 kg   LMP 02/19/2011   SpO2 98%   BMI 49.07 kg/m   Pertinent Labs, Studies, and Procedures:  BMP Latest Ref Rng & Units 08/12/2019 08/11/2019 07/02/2019  Glucose 70 - 99 mg/dL 86 88 104(H)  BUN 6 - 20 mg/dL _1 Creatinine 0.44 - 1.00 mg/dL 0.59 0.69 0.70  BUN/Creat Ratio 9 - 23 - - -  Sodium 135 - 145 mmol/L 139 139 138  Potassium 3.5 - 5.1 mmol/L 3.2(L) 3.9 3.2(L)  Chloride 98 - 111 mmol/L 102 101 97  CO2 22 - 32 mmol/L 28 28 35(H)  Calcium 8.9 - 10.3 mg/dL 8.7(L) 8.6(L) 8.9    CBC Latest Ref Rng & Units 08/12/2019 08/11/2019 07/02/2019  WBC 4.0 - 10.5 K/uL 10.8(H) 11.3(H) 8.6  Hemoglobin 12.0 - 15.0 g/dL 13.2 12.4  13.0  Hematocrit 36 - 46 % 41.5 39.9 39.9  Platelets 150 - 400 K/uL 479(H) 427(H) 497.0(H)   IR Fluoro Guide CV Line Right- New tunneled central line  Discharge Instructions: Discharge Instructions    Call MD for:  persistant nausea and vomiting   Complete by: As directed    Call MD for:  redness, tenderness, or signs of infection (pain, swelling, redness, odor or green/yellow discharge around incision site)   Complete by: As directed    Call MD  for:  severe uncontrolled pain   Complete by: As directed    Call MD for:  temperature >100.4   Complete by: As directed    Diet - low sodium heart healthy   Complete by: As directed    Discharge instructions   Complete by: As directed    Dear [name]  You came to [place] with shortness of breath. We have determined this was caused by you getting your remodulin infusion being stopped due to your line malfunctioning. Here are our recommendations for you at discharge:  Please take your Remodulin as prescribed  Thank you for choosing [facility]   Increase activity slowly   Complete by: As directed       Signed: Briant Cedar, MD 08/12/2019, 3:25 PM   Pager: [phone]

## 2019-08-12 NOTE — Progress Notes (Signed)
DISCHARGE NOTE HOME [name] to be discharged Home per MD order. Discussed prescriptions and follow up appointments with the patient. Prescriptions given to patient; medication list explained in detail. Patient verbalized understanding.  Skin clean, dry and intact without evidence of skin break down, no evidence of skin tears noted. IV catheter discontinued intact. Site without signs and symptoms of complications. Dressing and pressure applied. Pt denies pain at the site currently. No complaints noted.  Patient free of lines, drains, and wounds.   An After Visit Summary (AVS) was printed and given to the patient. Patient escorted via wheelchair, and discharged home via private auto.  Arlyss Repress, RN

## 2019-08-12 NOTE — ED Notes (Signed)
Attempted to call report

## 2019-08-12 NOTE — Progress Notes (Signed)
Right chest transparent dressing with biopatch in place dry, clean, and intact.

## 2019-08-12 NOTE — Discharge Instructions (Signed)
Pulmonary Hypertension Pulmonary hypertension is a long-term (chronic) condition in which there is high blood pressure in the arteries in the lungs (pulmonary arteries). This condition occurs when pulmonary arteries become narrow and tight, making it harder for blood to flow through the lungs. This in turn makes the heart work harder to pump blood through the lungs, making it harder for you to breathe. Over time, pulmonary hypertension can weaken and damage the heart muscle, specifically the right side of the heart. Pulmonary hypertension is a serious condition that can be life-threatening. What are the causes? This condition may be caused by different medical conditions. It can be categorized by cause into five groups:  Group 1: Pulmonary hypertension that is caused by abnormal growth of small blood vessels in the lungs (pulmonary arterial hypertension). The abnormal blood vessel growth may have no known cause, or it may be: ? Passed from parent to child (hereditary). ? Caused by another disease, such as a connective tissue disease (including lupus or scleroderma), congenital heart disease, liver disease, or HIV. ? Caused by certain medicines or poisons (toxins).  Group 2: Pulmonary hypertension that is caused by weakness of the left chamber of the heart (left ventricle) or heart valve disease.  Group 3: Pulmonary hypertension that is caused by lung disease or low oxygen levels. Causes in this group include: ? Emphysema or chronic obstructive pulmonary disease (COPD). ? Untreated sleep apnea. ? Pulmonary fibrosis. ? Long-term exposure to high altitudes in certain people who may already be at higher risk for pulmonary hypertension.  Group 4: Pulmonary hypertension that is caused by blood clots in the lungs (pulmonary emboli).  Group 5: Other causes of pulmonary hypertension, such as sickle cell anemia, sarcoidosis, tumors pressing on the pulmonary arteries, and various other diseases. What are  the signs or symptoms? Symptoms of this condition include:  Shortness of breath. You may notice shortness of breath with: ? Activity, such as walking. ? Minimal activity, such as getting dressed. ? No activity, like when you are sitting still.  A cough. Sometimes, bloody mucus from the lungs may be coughed up (hemoptysis).  Tiredness and fatigue.  Dizziness, lightheadedness, or fainting, especially with physical activity.  Rapid heartbeat, or feeling your heart flutter or skip a beat (palpitations).  Veins in the neck getting larger.  Swelling of the lower legs, abdomen, or both.  Bluish color of the lips and fingertips.  Chest pain or tightness in the chest.  Abdominal pain, especially in the upper abdomen. How is this diagnosed? This condition may be diagnosed based on one or more of the following tests:  Chest X-ray.  Blood tests.  CT scan.  Pulmonary function test. This test measures how much air your lungs can hold. It also tests how well air moves in and out of your lungs.  6-minute walk test. This tests how severe your condition is in relation to your activity levels.  Electrocardiogram (ECG). This test records the electrical impulses of the heart.  Echocardiogram. This test uses sound waves (ultrasound) to produce an image of the heart.  Cardiac catheterization. This is a procedure in which a thin tube (catheter) is passed into the pulmonary artery and used to test the pressure in your pulmonary artery and the right side of your heart.  Lung biopsy. This involves having a procedure to remove a small sample of lung tissue for testing. This may help determine an underlying cause of your pulmonary hypertension. How is this treated? There is no cure for  this condition, but treatment can help to relieve symptoms and slow the progress of the condition. Treatment may include:  Cardiac rehabilitation. This is a treatment program that includes exercise training,  education, and counseling to help you get stronger and return to an active lifestyle.  Oxygen therapy.  Medicines that: ? Lower blood pressure. ? Relax (dilate) the pulmonary blood vessels. ? Help the heart beat more efficiently and pump more blood. ? Help the body get rid of extra fluid (diuretics). ? Thin the blood in order to prevent blood clots in the lungs.  Lung surgery to relieve pressure on the heart, for severe cases that do not respond to medical treatment.  Heart-lung transplant, or lung transplant. This may be done in very severe cases. Follow these instructions at home: Eating and drinking   Eat a healthy diet that includes plenty of fresh fruits and vegetables, whole grains, and beans.  Limit your salt (sodium) intake to less than 2,300 mg a day. Lifestyle  Do not use any products that contain nicotine or tobacco, such as cigarettes and e-cigarettes. If you need help quitting, ask your health care provider.  Avoid secondhand smoke. Activity  Get plenty of rest.  Exercise as directed. Talk with your health care provider about what type of exercise is safe for you.  Avoid hot tubs and saunas.  Avoid high altitudes. General instructions  Take over-the-counter and prescription medicines only as told by your health care provider. Do not change or stop medicines without checking with your health care provider.  Stay up to date on your vaccines, especially yearly flu (influenza) and pneumonia vaccines.  If you are a woman of child-bearing age, avoid becoming pregnant. Talk with your health care provider about birth control.  Consider ways to get support for anxiety and stress of living with pulmonary hypertension. Talk with your health care provider about support groups and online resources.  Use oxygen therapy at home as directed.  Keep track of your weight. Weight gain could be a sign that your condition is getting worse.  Keep all follow-up visits as told by  your health care provider. This is important. Contact a health care provider if:  Your cough gets worse.  You have more shortness of breath than usual, or you start to have trouble doing activities that you could do before.  You need to use medicines or oxygen more frequently or in higher dosages than usual. Get help right away if:  You have severe shortness of breath.  You have chest pain or pressure.  You cough up blood.  You have swelling of your feet or legs that gets worse.  You have rapid weight gain over a period of 1-2 days.  Your medicines or oxygen do not provide relief. Summary  Pulmonary hypertension is a chronic condition in which there is high blood pressure in the arteries in the lungs (pulmonary arteries).  Pulmonary hypertension is a serious condition that can be life-threatening. It can be caused by a variety of illnesses.  Treatment may involve taking medicines and using oxygen therapy. Severe cases may require surgery or a transplant. This information is not intended to replace advice given to you by your health care provider. Make sure you discuss any questions you have with your health care provider. Document Revised: 12/03/2016 Document Reviewed: 03/16/2016 Elsevier Patient Education  [place].

## 2019-08-12 NOTE — Progress Notes (Signed)
Subjective: Interviewed patient at bedside.  She reports that she already had the new line placed when we saw her. She reported having some itching due to her not getting her medication.   Objective:  Vital signs in last 24 hours: Vitals:   08/12/19 0130 08/12/19 0131 08/12/19 0214 08/12/19 0318  BP:  (!) 101/54  120/68  Pulse: 83 82 83 80  Resp: _0 Temp:    98.6 F (37 C)  TempSrc:    Oral  SpO2: 90% 91% 90% 98%  Weight:      Height:       Physical Exam Vitals and nursing note reviewed.  Constitutional:      Appearance: She is ill-appearing (chronic).  HENT:     Head: Normocephalic and atraumatic.  Cardiovascular:     Rate and Rhythm: Normal rate and regular rhythm.     Pulses: Normal pulses.          Radial pulses are 2+ on the right side and 2+ on the left side.       Dorsalis pedis pulses are 2+ on the right side and 2+ on the left side.     Heart sounds: Normal heart sounds, S1 normal and S2 normal. No murmur heard.   Pulmonary:     Effort: Pulmonary effort is normal. No respiratory distress.     Breath sounds: Normal breath sounds. No wheezing.  Musculoskeletal:     Right lower leg: 2+ Edema present.     Left lower leg: 2+ Edema present.  Skin:    Findings: Rash (bilateral chronic venous stasis) present.  Neurological:     Mental Status: She is alert.      Assessment/Plan:  Active Problems:   Pulmonary hypertension ([place])   Ms. Steffey is a 59 y/o F with hx of pulmonary hypertension,COPD on 4 L of oxygen by nasal cannula at baseline,OSA, andCHF presenting for lost tunneled central venous catheter.    Pulmonary HTN in the setting of lost central line required for Remodulin administration: Pulmonary HTN managed at [facility] This double-lumen tunneled central venous catheter was placed by IR here in May 2021. It was cut to 20cm, consistent with the line that she brought with her. Line appears undamaged. She requires continuous administration of  Remodulin for her pulmonary HTN through a central line. Unable to get PICC for use until AM, but per pharmacy can receive this via PIV overnight but should have central access for long term use.  -IR to placed tunneled central line this monring -tolerated procedure well -continue Remodulin titrated to home dose at 1.30m/hr -continue home sildinafil 237mTID   R heart failure, NYHA class 3: Last echo on 4/5/4650ith RV systolic function moderately reduced. LVEF 65-70%. Patient denies recent exacerbation of symptoms. Did not take her torsemide yesterday because she woke late and then had the events that brought her to the ED. On exam has minimal pitting edema and mild crackles in L lower lung fields. States her dry weight is around 269lb, weighed 276 on admission. Given lasix until after procedure. Mag is 2.0 and phosphorous is 3.4 this morning. -Restart torsemide 80 mg BID -Strict I&Os -Daily weights   Leukocytosis: Wbc of 10.8. Afebrile and no other signs of infections -Monitor for signs of infection -Was given Cefazolin 2 g   Hx A flutter: Currently in normal sinus rhythm with increased PR interval present on prior EKGs. -Continue Xarelto 2047maily   DM type II: On  metformin at home. Last A1c 5.5. -start sliding scale insulin   Obstructive sleep apnea: On CPAP at night. Last documented setting in 2018 of 16-20, 3L O2. -start CPAP   Prior to Admission Living Arrangement: Home Anticipated Discharge Location: Home Barriers to Discharge: Catheter placement Dispo: Anticipated discharge today.   Briant Cedar, MD 08/12/2019, 6:13 AM Pager: [phone] After 5pm on weekdays and 1pm on weekends: On Call pager [phone]

## 2019-08-12 NOTE — Progress Notes (Signed)
New Admission Note: 08/12/2019    Arrival Method: stretcher Mental Orientation:alert and oriented x 4 Telemetry: 2nd verification completed Assessment: completed Skin: 2 RNs checked.  IV: RAC infusing Pain: No pain Admission: Completed [facility] [place] Orientation: Patient has been orientated to the room, unit and staff.    Orders have been reviewed and implemented. Will continue to monitor the patient. Call light has been placed within reach.

## 2019-08-14 ENCOUNTER — Other Ambulatory Visit: Payer: Self-pay | Admitting: Internal Medicine

## 2019-08-21 ENCOUNTER — Ambulatory Visit (HOSPITAL_COMMUNITY)
Admission: RE | Admit: 2019-08-21 | Discharge: 2019-08-21 | Disposition: A | Discharge status: Home / Self Care | Payer: Medicaid Other | Source: Ambulatory Visit | Attending: Internal Medicine | Admitting: Internal Medicine

## 2019-08-21 ENCOUNTER — Other Ambulatory Visit: Payer: Self-pay

## 2019-08-21 ENCOUNTER — Encounter (HOSPITAL_COMMUNITY): Payer: Self-pay | Admitting: Internal Medicine

## 2019-08-21 ENCOUNTER — Other Ambulatory Visit: Payer: Self-pay | Admitting: *Deleted

## 2019-08-21 VITALS — BP 110/68 | HR 85 | Ht 63.0 in | Wt 275.2 lb

## 2019-08-21 DIAGNOSIS — Z7984 Long term (current) use of oral hypoglycemic drugs: Secondary | ICD-10-CM | POA: Diagnosis not present

## 2019-08-21 DIAGNOSIS — I480 Paroxysmal atrial fibrillation: Secondary | ICD-10-CM | POA: Diagnosis not present

## 2019-08-21 DIAGNOSIS — I2781 Cor pulmonale (chronic): Secondary | ICD-10-CM | POA: Diagnosis not present

## 2019-08-21 DIAGNOSIS — J449 Chronic obstructive pulmonary disease, unspecified: Secondary | ICD-10-CM | POA: Insufficient documentation

## 2019-08-21 DIAGNOSIS — Z7901 Long term (current) use of anticoagulants: Secondary | ICD-10-CM | POA: Insufficient documentation

## 2019-08-21 DIAGNOSIS — Z9989 Dependence on other enabling machines and devices: Secondary | ICD-10-CM | POA: Insufficient documentation

## 2019-08-21 DIAGNOSIS — Z885 Allergy status to narcotic agent status: Secondary | ICD-10-CM | POA: Insufficient documentation

## 2019-08-21 DIAGNOSIS — D509 Iron deficiency anemia, unspecified: Secondary | ICD-10-CM | POA: Insufficient documentation

## 2019-08-21 DIAGNOSIS — Z882 Allergy status to sulfonamides status: Secondary | ICD-10-CM | POA: Insufficient documentation

## 2019-08-21 DIAGNOSIS — I110 Hypertensive heart disease with heart failure: Secondary | ICD-10-CM | POA: Insufficient documentation

## 2019-08-21 DIAGNOSIS — Z888 Allergy status to other drugs, medicaments and biological substances status: Secondary | ICD-10-CM | POA: Insufficient documentation

## 2019-08-21 DIAGNOSIS — G4733 Obstructive sleep apnea (adult) (pediatric): Secondary | ICD-10-CM | POA: Insufficient documentation

## 2019-08-21 DIAGNOSIS — I2721 Secondary pulmonary arterial hypertension: Secondary | ICD-10-CM | POA: Diagnosis not present

## 2019-08-21 DIAGNOSIS — J9611 Chronic respiratory failure with hypoxia: Secondary | ICD-10-CM | POA: Insufficient documentation

## 2019-08-21 DIAGNOSIS — I2510 Atherosclerotic heart disease of native coronary artery without angina pectoris: Secondary | ICD-10-CM | POA: Insufficient documentation

## 2019-08-21 DIAGNOSIS — I4892 Unspecified atrial flutter: Secondary | ICD-10-CM | POA: Insufficient documentation

## 2019-08-21 DIAGNOSIS — I5022 Chronic systolic (congestive) heart failure: Secondary | ICD-10-CM

## 2019-08-21 DIAGNOSIS — Z8349 Family history of other endocrine, nutritional and metabolic diseases: Secondary | ICD-10-CM | POA: Insufficient documentation

## 2019-08-21 DIAGNOSIS — Z9049 Acquired absence of other specified parts of digestive tract: Secondary | ICD-10-CM | POA: Insufficient documentation

## 2019-08-21 DIAGNOSIS — J069 Acute upper respiratory infection, unspecified: Secondary | ICD-10-CM

## 2019-08-21 DIAGNOSIS — I272 Pulmonary hypertension, unspecified: Secondary | ICD-10-CM | POA: Diagnosis not present

## 2019-08-21 DIAGNOSIS — E119 Type 2 diabetes mellitus without complications: Secondary | ICD-10-CM | POA: Diagnosis not present

## 2019-08-21 DIAGNOSIS — Z886 Allergy status to analgesic agent status: Secondary | ICD-10-CM | POA: Insufficient documentation

## 2019-08-21 DIAGNOSIS — I5032 Chronic diastolic (congestive) heart failure: Secondary | ICD-10-CM

## 2019-08-21 DIAGNOSIS — F79 Unspecified intellectual disabilities: Secondary | ICD-10-CM | POA: Diagnosis not present

## 2019-08-21 DIAGNOSIS — E785 Hyperlipidemia, unspecified: Secondary | ICD-10-CM | POA: Insufficient documentation

## 2019-08-21 DIAGNOSIS — Z79899 Other long term (current) drug therapy: Secondary | ICD-10-CM | POA: Insufficient documentation

## 2019-08-21 DIAGNOSIS — K219 Gastro-esophageal reflux disease without esophagitis: Secondary | ICD-10-CM | POA: Diagnosis not present

## 2019-08-21 DIAGNOSIS — Z9981 Dependence on supplemental oxygen: Secondary | ICD-10-CM | POA: Diagnosis not present

## 2019-08-21 DIAGNOSIS — J309 Allergic rhinitis, unspecified: Secondary | ICD-10-CM

## 2019-08-21 LAB — BASIC METABOLIC PANEL
Anion gap: 11 (ref 5–15)
BUN: 15 mg/dL (ref 6–20)
CO2: 29 mmol/L (ref 22–32)
Calcium: 8.9 mg/dL (ref 8.9–10.3)
Chloride: 99 mmol/L (ref 98–111)
Creatinine, Ser: 0.77 mg/dL (ref 0.44–1.00)
GFR calc Af Amer: >60 mL/min (ref >60)
GFR calc non Af Amer: >60 mL/min (ref >60)
Glucose, Bld: 114 mg/dL — ABNORMAL HIGH (ref 70–99)
Potassium: 3.8 mmol/L (ref 3.5–5.1)
Sodium: 139 mmol/L (ref 135–145)

## 2019-08-21 LAB — BRAIN NATRIURETIC PEPTIDE: B Natriuretic Peptide: 33.2 pg/mL (ref 0.0–100.0)

## 2019-08-21 MED ORDER — CETIRIZINE HCL 10 MG PO TABS: 10.0000 mg | ORAL_TABLET | Freq: Every day | ORAL | 0 refills | Status: DC

## 2019-08-21 MED ORDER — TORSEMIDE 20 MG PO TABS: 80.0000 mg | ORAL_TABLET | ORAL | 0 refills | Status: DC

## 2019-08-21 MED ORDER — POTASSIUM CHLORIDE CRYS ER 20 MEQ PO TBCR: 40.0000 meq | EXTENDED_RELEASE_TABLET | Freq: Three times a day (TID) | ORAL | 0 refills | Status: DC

## 2019-08-21 MED ORDER — FLUTICASONE PROPIONATE 50 MCG/ACT NA SUSP: 1.0000 | Freq: Every day | NASAL | 2 refills | Status: DC

## 2019-08-21 NOTE — Patient Instructions (Signed)
Please call our office in May 2022 to schedule your follow up appointment   Labs done today, your results will be available in MyChart, we will contact you for abnormal readings.   If you have any questions or concerns before your next appointment please send [place] a message through [place] or call our office at [phone].    TO LEAVE A MESSAGE FOR THE NURSE SELECT OPTION 2, PLEASE LEAVE A MESSAGE INCLUDING: . YOUR NAME . DATE OF BIRTH . CALL BACK NUMBER . REASON FOR CALL**this is important as we prioritize the call backs  [place] AS LONG AS YOU CALL BEFORE 4:00 PM   At the [facility], you and your health needs are our priority. As part of our continuing mission to provide you with exceptional heart care, we have created designated Provider Care Teams. These Care Teams include your primary Cardiologist (physician) and Advanced Practice Providers (APPs- Physician Assistants and Nurse Practitioners) who all work together to provide you with the care you need, when you need it.   You may see any of the following providers on your designated Care Team at your next follow up: Marland Kitchen Dr Glori Bickers . Dr Loralie Champagne . Darrick Grinder, NP . Lyda Jester, PA . Audry Riles, PharmD   Please be sure to bring in all your medications bottles to every appointment.

## 2019-08-21 NOTE — Telephone Encounter (Signed)
Patient walked in to make an appt with new PCP (09/13/2019) and request refills. States she's has been out of metformin x 2 months. #45 sent 06/07/2019. Confirmed with Katrina at [facility] they have this Rx and will get it ready for patient. They also have Rx for torsemide sent 08/15/2019. They were waiting for another provider name since Dr. Allyson Sabal is not yet registered with [facility]. Gave them today's Attending. Katrina will get this ready as well.  Will forward refill requests for cetirizine, Flonase and potassium. Hubbard Hartshorn, BSN, RN-BC

## 2019-08-21 NOTE — Telephone Encounter (Signed)
Fax from [facility] - stated Dr Allyson Sabal is not covered as a Medicaid provider. Send new rx.

## 2019-08-21 NOTE — Progress Notes (Signed)
Advanced Heart Failure Clinic Note   Date:  08/21/2019   ID:  [name], DOB 07/30/1960, MRN [id number]  Location: Home  Provider location: [facility] Type of Visit: Established patient  PCP:  [name], MD  Cardiologist:  No primary care provider on file. Primary HF: [place] Fors  Chief Complaint: Heart Failure follow-up   History of Present Illness:  [name] is a 59 y.o. 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   She was admitted to [facility] in July 2014 with respiratory failure and RHC showed severe PAH with pulmonary pressures in the 90s. She was transferred to [facility] where she was started on IV Remodulin with excellent results.  She was admitted 08/16/12 for CP and tachycardia, was found to be in atypical flutter vs atrial tach. Amiodarone was considered but she converted spontaneously. She was started on Xarelto. Her discharge weight was 215 lbs.    Cath 2/16: Minimal CAD LAD 30% otherwise normal RHC on Remodulin RA = 8 RV = 45/5/8 PA = 52/24 (34) PCW = 11 Fick cardiac output/index = 7.6/3.6 PVR = 3.0 WU FA sat = 99% PA sat = 70%, 71%  Admitted 6/20 with respiratory failure and recurrent AFL in setting of PNA.   Admitted 4/21 with severe sepsis from RLE cellulitis. Also had makred volume overload. Bumped troponin and underwent R/L cath. Echo 4/21 EF 65-70% RV moderately down   1. 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 Normal LVEF 60-65% 3. RHC Ao = 94/64 (78) LV = 86/11 RA =  6 RV = 69/4 PA = 63/17 (37) PCW = 7 Fick cardiac output/index = 6.0/2.8 PVR = 5.0 WU Ao sat = 96% PA sat = 66%, 68%  Here for routine f/u. Seen by Dr. Gilles Chiquito at [facility] in 7/21. Note reviewed in Care  Everywhere. Remains on remodulin. Was seen in ER last week due to dislodged Hickman catheter. Had catheter replaced in ED. Had 2 falls last month when she tripped in the shower. Breathing is stable. Able to do ADLS without too much problem. Edema improved after cutting back on sodas and sweets. No orthopnea or PND. No problem with remodulin cartridges.    Studies:   ECHO 08/11/12 EF 60-65% RV moderately dilated Peak PA pressure 48 mmhg  ECHO 02/28/13 EF 55-60% Grade I DD RV severely dilated and HK. Trivial TR. Septum flat Peak PA pressure 53 mmhg ECHO 11/15 EF 55-60% RV normal Trivial TR ECHO 1/16 at [facility] EF >55% RV mildly dilated (improved function) Trivial TR normal IVC ECHO 11/2014: EF 55-60%.  RV modrately dilated. Peak PA pressure 78 mm hg ECHO 03/2015: At [facility] RV severely dilated. EF >55% RVSP 70mHG [facility]  [facility] Room air sat was 96% sats fell briefly to 89 at the very end of the walk she went 341 m this is 82% predicted 6MWD 9/17 was 362 m  This is 88% predicted on 3 L oxygen saturations did not fall below 93%. 6MWD 3/18 3483m^MW 2020 27245mMW 7/21 263m58mDuke erformed on 4 L of oxygen. Resting room air sat is 87%. On the 4 L she is 98 it drops to 90 with exertion. She  ambulated 263 m this is 68% predicted last time very similar at 41 m and similar the prior visit as well.    [name] denies symptoms worrisome for COVID 19.   Past Medical History:  Diagnosis Date  . Anemia, iron deficiency    secondary to menhorrhagia, on oral iron, also b12 def, getting monthly b12 shots  . Cellulitis 05/2019   RIGHT LOWER EXTREMITY  . CHF (congestive heart failure) ([place])   . Chronic cough    secondary to alleriges and post nasal drip  . Cognitive impairment   . COPD (chronic obstructive pulmonary disease) ([place])   . Cor pulmonale (HCC)    PA Peak pressure 5mHg  . Depression   . Diabetes mellitus    well controlled on  metformin  . Diastolic heart failure ([place]   . GERD (gastroesophageal reflux disease)   . H/O mental retardation   . Herpes   . Hyperlipidemia   . Hypertension   . OSA (obstructive sleep apnea)    CPAP  . Pulmonary hypertension ([place]   . Renal disorder   . Restrictive lung disease    PFTs 06/2012 (FVC 54% predicted and FEV1 68% predicted w minimal bronchodilator response).  . Shortness of breath   . 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 CARDIAC CATHETERIZATION N/A 01/13/2016   Procedure: Right Heart Cath;  Surgeon: DJolaine Artist MD;  [place];  Service: Cardiovascular;  Laterality: N/A;  . CHOLECYSTECTOMY    . IR FLUORO GUIDE CV LINE RIGHT  05/09/2019  . IR FLUORO GUIDE CV LINE RIGHT  08/12/2019  . IR REMOVAL TUN CV CATH W/O FL  05/05/2019  . IR [place] ACCESS RIGHT  05/09/2019  . IR [place] VASC ACCESS RIGHT  08/12/2019  . LEFT AND RIGHT HEART CATHETERIZATION WITH CORONARY ANGIOGRAM N/A 02/28/2014   Procedure: LEFT AND RIGHT HEART CATHETERIZATION WITH CORONARY ANGIOGRAM;  Surgeon: DJolaine Artist MD;  [facility];  Service: Cardiovascular;  Laterality: N/A;  . RIGHT HEART CATH N/A 06/27/2018   Procedure: RIGHT HEART CATH;  Surgeon: BJolaine Artist MD;  [place];  Service: Cardiovascular;  Laterality: N/A;  . RIGHT/LEFT HEART CATH AND CORONARY ANGIOGRAPHY N/A 05/14/2019   Procedure: RIGHT/LEFT HEART CATH AND CORONARY ANGIOGRAPHY;  Surgeon: BJolaine Artist MD;  [place];  Service: Cardiovascular;  Laterality: N/A;     Current Outpatient Medications  Medication Sig Dispense Refill  . ACCU-CHEK AVIVA PLUS test strip USE 1 STRIP TO CHECK GLUCOSE ONCE DAILY 50 each 3  . acetaminophen (TYLENOL) 500 MG tablet Take 1 tablet (500 mg total) by mouth every  6 (six) hours as needed. 30 tablet 0  . cyanocobalamin 500 MCG tablet Take 500 mcg by mouth daily. Vitamin b12    . cycloSPORINE (RESTASIS) 0.05 % ophthalmic emulsion Place 1 drop into both eyes 2 (two) times daily as needed (dry eyes).    . diphenhydrAMINE (BENADRYL) 25 MG tablet Take 50-100 mg by mouth 2 (two) times daily as needed for itching.    .Noelle PennerALLERGY RELIEF, CETIRIZINE, 10 MG tablet Take 1 tablet by mouth once daily 30 tablet 0  . fluticasone (FLONASE) 50 MCG/ACT nasal spray Place 1 spray into both nostrils daily. 16 g 2  .  gabapentin (NEURONTIN) 300 MG capsule TAKE 2 CAPSULES BY MOUTH THREE TIMES DAILY 540 capsule 0  . metFORMIN (GLUCOPHAGE) 500 MG tablet TAKE 1/2 (ONE-HALF) TABLET BY MOUTH WITH BREAKFAST 45 tablet 0  . OXYGEN Inhale 4 L into the lungs continuous.    . pantoprazole (PROTONIX) 20 MG tablet Take 1 tablet (20 mg total) by mouth 2 (two) times daily. 180 tablet 3  . potassium chloride SA (KLOR-CON) 20 MEQ tablet Take 2 tablets (40 mEq total) by mouth 3 (three) times daily.    . pravastatin (PRAVACHOL) 20 MG tablet Take 1 tablet by mouth once daily 90 tablet 3  . sildenafil (REVATIO) 20 MG tablet Take 1 tablet (20 mg total) by mouth 3 (three) times daily. 270 tablet 3  . spironolactone (ALDACTONE) 25 MG tablet Take 1/2 (one-half) tablet by mouth once daily 45 tablet 0  . torsemide (DEMADEX) 20 MG tablet Take 4 tablets (80 mg total) by mouth See admin instructions. Take 4 tablets by mouth twice daily 240 tablet 0  . treprostinil (REMODULIN) 5 MG/ML SOLN injection See admin instructions. As of 08/09/2019: Add 7 ml of Remodulin to cassette and 93 ml of sterile diluent for Remodulin to cassetet for a total volume of 100 ml to make a concentration of 350,000 ng per ml.  Infuse via a CADD pump intravenously at a rate of 40 ml per 24 hours. Based on a dosing weight of 120 kg, the dose is 81 ng per kg per min.  Once opened, discard Remodulin vial after 30 days.  Sterile Diluent for  Remodulin vials are single use only. Once mixed, discard cassette after 48 hours.    [name] 20 MG TABS tablet TAKE 1 TABLET BY MOUTH ONCE DAILY WITH SUPPER 30 tablet 4   No current facility-administered medications for this encounter.    Allergies:   Aspirin, Codeine, Lisinopril, Sulfonamide derivatives, and Latex   Social History:  The patient  reports that she has never smoked. She has never used smokeless tobacco. She reports that she does not drink alcohol and does not use drugs.   Family History:  The patient's family history includes Bipolar disorder in her sister; Breast cancer in her maternal aunt; Hyperlipidemia in her mother; Kidney Stones in her son; Mental illness in her sister; Mental retardation in her brother.   ROS:  Please see the history of present illness.   All other systems are personally reviewed and negative.   Vitals:   08/21/19 1152  BP: 110/68  Pulse: 85  SpO2: 90%  Weight: 124.8 kg  Height: _0  (1.6 m)     Exam:  General:  Obese woman on O2 no distress HEENT: normal Neck: supple. no JVD. Carotids 2+ bilat; no bruits. No lymphadenopathy or thryomegaly appreciated. Cor: PMI nondisplaced. Regular rate & rhythm. No rubs, gallops or murmurs. Lungs: clear Abdomen: obese  soft, nontender, nondistended. No hepatosplenomegaly. No bruits or masses. Good bowel sounds. Extremities: no cyanosis, clubbing, rash, scars from healed wounds and chronic venous stasis changes Neuro: alert & orientedx3, cranial nerves grossly intact. moves all 4 extremities w/o difficulty. Affect pleasant  ECG: NSR 87 RAD Personally reviewed   Recent Labs: 05/07/2019: B Natriuretic Peptide 325.9 05/12/2019: ALT 14 07/02/2019: Pro B Natriuretic peptide (BNP) 49.0 08/12/2019: BUN 6; Creatinine, Ser 0.59; Hemoglobin 13.2; Magnesium 2.0; Platelets 479; Potassium 3.2; Sodium 139  Personally reviewed   Wt Readings from Last 3 Encounters:  08/21/19 124.8 kg (275 lb 3.2 oz)  08/11/19 125.6  kg (277 lb)  07/02/19 124.1 kg (273 lb 9.6 oz)      ASSESSMENT AND PLAN:  1) Chronic diastolic HF/cor pulmonale:  - NYHA III symptoms chronically. Volume status looks good - Continue torsemide 80 mg daily. - Continue spiro 12.5 mg daily - Reinforced fluid restriction to < 2 L daily, sodium restriction to less than 2000 mg daily, and the importance of daily weights.   - Labs today    2) PAH, severe with cor pulmonale.  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 However PAH far out of proportion to L-sided pressures or restrictive lung disease. - Follows with [facility]. Last seen 7/21 - Remains on Remodulin + sildenafil.   - Developed pulmonary edema with ERA in past so have not rechallenged - Recent 6MW stable  3) Paroxysmal atrial flutter.  - Back in NSR. Continue Xarelto. No bleeding  4) Chronic hypoxic respiratory failure - continue O2   Signed, Glori Bickers, MD  08/21/2019 12:12 PM  Advanced Heart Failure [facility] [place] [address] [phone] (office) [phone] (fax)

## 2019-08-22 ENCOUNTER — Encounter: Payer: Self-pay | Admitting: Pulmonary Disease

## 2019-08-22 ENCOUNTER — Ambulatory Visit: Payer: Medicaid Other | Admitting: Pulmonary Disease

## 2019-08-22 VITALS — BP 138/86 | HR 90 | Temp 98.0°F | Ht 63.0 in | Wt 276.6 lb

## 2019-08-22 DIAGNOSIS — G4733 Obstructive sleep apnea (adult) (pediatric): Secondary | ICD-10-CM

## 2019-08-22 NOTE — Progress Notes (Addendum)
[name]    [id number]    12-15-1960  Primary Care Physician:Johnson, Rodman Key, MD  Referring Physician: [name], MD [place],  [place] [address]  Chief complaint:   Patient with a history of obstructive sleep apnea History of pulmonary hypertension Chronic respiratory failure  HPI:  She is on BiPAP chronically BiPAP of 20/16  Reports multiple awakenings -On diuretics  History of pulmonary hypertension History of chronic systolic heart failure  Morbid obesity, cognitive impairment, diastolic heart failure, paroxysmal atrial fibrillation, cor pulmonale, severe pulmonary hypertension -Currently on treprostinil and sildenafil  She feels relatively well Was recently hospitalized for cellulitis, has had some falls recently which were accidental Usually goes to bed about midnight Takes about 30 minutes 1 hour to fall asleep 3-4 awakenings Final wake up time may be of 2 midday the next day Weight fluctuates depending on diuresis  Recent right heart cart noted from cardiology documentation  She is on 4 L oxygen Recent exercise study reveals 68% predicted Able to walk about 263 m  She has been on BiPAP now for about 6 years.  Outpatient Encounter Medications as of 08/22/2019  Medication Sig  . ACCU-CHEK AVIVA PLUS test strip USE 1 STRIP TO CHECK GLUCOSE ONCE DAILY  . acetaminophen (TYLENOL) 500 MG tablet Take 1 tablet (500 mg total) by mouth every 6 (six) hours as needed.  . cetirizine (EQ ALLERGY RELIEF, CETIRIZINE,) 10 MG tablet Take 1 tablet (10 mg total) by mouth daily.  . cyanocobalamin 500 MCG tablet Take 500 mcg by mouth daily. Vitamin b12  . cycloSPORINE (RESTASIS) 0.05 % ophthalmic emulsion Place 1 drop into both eyes 2 (two) times daily as needed (dry eyes).  . diphenhydrAMINE (BENADRYL) 25 MG tablet Take 50-100 mg by mouth 2 (two) times daily as needed for itching.  . fluticasone (FLONASE) 50 MCG/ACT nasal spray Place 1 spray  into both nostrils daily.  Marland Kitchen gabapentin (NEURONTIN) 300 MG capsule TAKE 2 CAPSULES BY MOUTH THREE TIMES DAILY  . metFORMIN (GLUCOPHAGE) 500 MG tablet TAKE 1/2 (ONE-HALF) TABLET BY MOUTH WITH BREAKFAST  . OXYGEN Inhale 4 L into the lungs continuous.  . pantoprazole (PROTONIX) 20 MG tablet Take 1 tablet (20 mg total) by mouth 2 (two) times daily.  . potassium chloride SA (KLOR-CON) 20 MEQ tablet Take 2 tablets (40 mEq total) by mouth 3 (three) times daily.  . pravastatin (PRAVACHOL) 20 MG tablet Take 1 tablet by mouth once daily  . sildenafil (REVATIO) 20 MG tablet Take 1 tablet (20 mg total) by mouth 3 (three) times daily.  Marland Kitchen spironolactone (ALDACTONE) 25 MG tablet Take 1/2 (one-half) tablet by mouth once daily  . torsemide (DEMADEX) 20 MG tablet Take 4 tablets (80 mg total) by mouth See admin instructions. Take 4 tablets by mouth twice daily  . treprostinil (REMODULIN) 5 MG/ML SOLN injection See admin instructions. As of 08/09/2019: Add 7 ml of Remodulin to cassette and 93 ml of sterile diluent for Remodulin to cassetet for a total volume of 100 ml to make a concentration of 350,000 ng per ml.  Infuse via a CADD pump intravenously at a rate of 40 ml per 24 hours. Based on a dosing weight of 120 kg, the dose is 81 ng per kg per min.  Once opened, discard Remodulin vial after 30 days.  Sterile Diluent for Remodulin vials are single use only. Once mixed, discard cassette after 48 hours.  [name] 20 MG TABS  tablet TAKE 1 TABLET BY MOUTH ONCE DAILY WITH SUPPER  . [DISCONTINUED] cetirizine (ZYRTEC) 10 MG tablet Take 1 tablet by mouth once daily   No facility-administered encounter medications on file as of 08/22/2019.    Allergies as of 08/22/2019 - Review Complete 08/22/2019  Allergen Reaction Noted  . Aspirin Swelling 12/09/2005  . Codeine Other (See Comments)   . Lisinopril Cough 05/02/2012  . Sulfonamide derivatives Swelling 10/19/2005  . Latex Rash 01/07/2016    Past Medical History:    Diagnosis Date  . Anemia, iron deficiency    secondary to menhorrhagia, on oral iron, also b12 def, getting monthly b12 shots  . Cellulitis 05/2019   RIGHT LOWER EXTREMITY  . CHF (congestive heart failure) ([place])   . Chronic cough    secondary to alleriges and post nasal drip  . Cognitive impairment   . COPD (chronic obstructive pulmonary disease) ([place])   . Cor pulmonale (HCC)    PA Peak pressure 54mHg  . Depression   . Diabetes mellitus    well controlled on metformin  . Diastolic heart failure ([place]   . GERD (gastroesophageal reflux disease)   . H/O mental retardation   . Herpes   . Hyperlipidemia   . Hypertension   . OSA (obstructive sleep apnea)    CPAP  . Pulmonary hypertension ([place]   . Renal disorder   . Restrictive lung disease    PFTs 06/2012 (FVC 54% predicted and FEV1 68% predicted w minimal bronchodilator response).  . Shortness of breath   . Venous stasis ulcer ([place]    chornic, ?followed up at wound care center, multiple courses of antibiotics in past for cellulitis, on lasix    Past Surgical History:  Procedure Laterality Date  . CARDIAC CATHETERIZATION N/A 01/13/2016   Procedure: Right Heart Cath;  Surgeon: DJolaine Artist MD;  [place];  Service: Cardiovascular;  Laterality: N/A;  . CHOLECYSTECTOMY    . IR FLUORO GUIDE CV LINE RIGHT  05/09/2019  . IR FLUORO GUIDE CV LINE RIGHT  08/12/2019  . IR REMOVAL TUN CV CATH W/O FL  05/05/2019  . IR [place] VASC ACCESS RIGHT  05/09/2019  . IR [place] VASC ACCESS RIGHT  08/12/2019  . LEFT AND RIGHT HEART CATHETERIZATION WITH CORONARY ANGIOGRAM N/A 02/28/2014   Procedure: LEFT AND RIGHT HEART CATHETERIZATION WITH CORONARY ANGIOGRAM;  Surgeon: DJolaine Artist MD;  [facility];  Service: Cardiovascular;  Laterality: N/A;  . RIGHT HEART CATH N/A 06/27/2018   Procedure: RIGHT HEART CATH;  Surgeon: BJolaine Artist MD;  [place];  Service: Cardiovascular;  Laterality:  N/A;  . RIGHT/LEFT HEART CATH AND CORONARY ANGIOGRAPHY N/A 05/14/2019   Procedure: RIGHT/LEFT HEART CATH AND CORONARY ANGIOGRAPHY;  Surgeon: BJolaine Artist MD;  [place];  Service: Cardiovascular;  Laterality: N/A;    Family History  Problem Relation Age of Onset  . Kidney Stones Son   . Mental illness Sister   . Bipolar disorder Sister   . Mental retardation Brother   . Hyperlipidemia Mother   . Breast cancer Maternal Aunt     Social History   Socioeconomic History  . Marital status: Single    Spouse name: Not on file  . Number of children: Not on file  . Years of education: Not on file  . Highest education level: Not on file  Occupational History  . Not on file  Tobacco Use  .  Smoking status: Never Smoker  . Smokeless tobacco: Never Used  Vaping Use  . Vaping Use: Never used  Substance and Sexual Activity  . Alcohol use: No    Alcohol/week: 0.0 standard drinks  . Drug use: No  . Sexual activity: Not Currently  Other Topics Concern  . Not on file  Social History Narrative  . Not on file   Social Determinants of Health   Financial Resource Strain:   . Difficulty of Paying Living Expenses:   Food Insecurity:   . Worried About Charity fundraiser in the Last Year:   . Arboriculturist in the Last Year:   Transportation Needs:   . Film/video editor (Medical):   Marland Kitchen Lack of Transportation (Non-Medical):   Physical Activity:   . Days of Exercise per Week:   . Minutes of Exercise per Session:   Stress:   . Feeling of Stress :   Social Connections:   . Frequency of Communication with Friends and Family:   . Frequency of Social Gatherings with Friends and Family:   . Attends Religious Services:   . Active Member of Clubs or Organizations:   . Attends Archivist Meetings:   Marland Kitchen Marital Status:   Intimate Partner Violence:   . Fear of Current or Ex-Partner:   . Emotionally Abused:   Marland Kitchen Physically Abused:   . Sexually Abused:      Review of Systems  Constitutional: Positive for fatigue.  Respiratory: Positive for apnea and shortness of breath.   Cardiovascular: Positive for leg swelling.  Gastrointestinal: Negative.   Genitourinary: Negative.   Psychiatric/Behavioral: Positive for sleep disturbance.    Vitals:   08/22/19 1122  BP: 138/86  Pulse: 90  Temp: 98 F (36.7 C)  SpO2: 90%     Physical Exam Constitutional:      Appearance: She is obese.  HENT:     Head: Normocephalic.     Nose: No congestion.     Mouth/Throat:     Mouth: Mucous membranes are moist.     Pharynx: No oropharyngeal exudate.  Eyes:     Pupils: Pupils are equal, round, and reactive to light.  Cardiovascular:     Rate and Rhythm: Normal rate and regular rhythm.     Pulses: Normal pulses.     Heart sounds: No murmur heard.   Pulmonary:     Effort: Pulmonary effort is normal. No respiratory distress.     Breath sounds: Normal breath sounds. No stridor. No wheezing or rhonchi.  Musculoskeletal:        General: Swelling present. Normal range of motion.     Cervical back: No rigidity or tenderness.  Skin:    General: Skin is warm.  Neurological:     Mental Status: She is alert.    Results of the Epworth flowsheet 08/22/2019  Sitting and reading 0  Watching TV 1  Sitting, inactive in a public place (e.g. a theatre or a meeting) 2  As a passenger in a car for an hour without a break 2  Lying down to rest in the afternoon when circumstances permit 1  Sitting and talking to someone 0  Sitting quietly after a lunch without alcohol 1  In a car, while stopped for a few minutes in traffic 0  Total score 7   Data Reviewed: Right heart catheterization results noted  Care everywhere report by Dr. Gilles Chiquito reviewed   Compliance data reveals 100% compliance AutoSet 16-20 95 percentile  pressure of 17.4, maximum pressure of 18 AHI of 12 There is some significant air leak  Assessment:  History of obstructive sleep apnea on  BiPAP therapy She is on auto CPAP 16-20 Compliance is 100%  Pulmonary hypertension -On Remodulin, sildenafil  Diastolic heart failure -On diuretics-torsemide 80 twice daily  Morbid obesity -Ambulates with a walker -Unable to exercise actively  Atrial flutter -On anticoagulation  Deconditioning  Plan/Recommendations: Mask change from nose mask to full facemask -She stated she tolerated a full facemask while in the hospital recently  We will send prescription to medical supply company to facilitate mask change  Continue current CPAP settings  Follow-up in 6 to 8 weeks   Faith Rist MD [facility] 08/22/2019, 12:06 PM  CC: Faith Mulligan, MD

## 2019-08-22 NOTE — Patient Instructions (Addendum)
Continue using BiPAP on a regular basis  Contact adapt for a full facemask (Tolerated this well when she was recently in the hospital)  I will see you back in about 6 to 8 weeks  Call with significant concerns

## 2019-08-30 DIAGNOSIS — I270 Primary pulmonary hypertension: Secondary | ICD-10-CM | POA: Diagnosis not present

## 2019-08-30 DIAGNOSIS — G4733 Obstructive sleep apnea (adult) (pediatric): Secondary | ICD-10-CM | POA: Diagnosis not present

## 2019-08-30 DIAGNOSIS — G4730 Sleep apnea, unspecified: Secondary | ICD-10-CM | POA: Diagnosis not present

## 2019-09-05 DIAGNOSIS — G4730 Sleep apnea, unspecified: Secondary | ICD-10-CM | POA: Diagnosis not present

## 2019-09-05 DIAGNOSIS — I270 Primary pulmonary hypertension: Secondary | ICD-10-CM | POA: Diagnosis not present

## 2019-09-05 DIAGNOSIS — J984 Other disorders of lung: Secondary | ICD-10-CM | POA: Diagnosis not present

## 2019-09-05 DIAGNOSIS — I5030 Unspecified diastolic (congestive) heart failure: Secondary | ICD-10-CM | POA: Diagnosis not present

## 2019-09-13 ENCOUNTER — Encounter: Payer: Self-pay | Admitting: Student

## 2019-09-13 ENCOUNTER — Other Ambulatory Visit: Payer: Self-pay

## 2019-09-13 ENCOUNTER — Ambulatory Visit (INDEPENDENT_AMBULATORY_CARE_PROVIDER_SITE_OTHER): Payer: Medicaid Other | Admitting: Student

## 2019-09-13 VITALS — BP 112/67 | HR 94 | Temp 98.4°F | Wt 278.9 lb

## 2019-09-13 DIAGNOSIS — Z8719 Personal history of other diseases of the digestive system: Secondary | ICD-10-CM

## 2019-09-13 DIAGNOSIS — R310 Gross hematuria: Secondary | ICD-10-CM | POA: Insufficient documentation

## 2019-09-13 DIAGNOSIS — Z23 Encounter for immunization: Secondary | ICD-10-CM | POA: Diagnosis not present

## 2019-09-13 DIAGNOSIS — N950 Postmenopausal bleeding: Secondary | ICD-10-CM | POA: Diagnosis not present

## 2019-09-13 DIAGNOSIS — Z0000 Encounter for general adult medical examination without abnormal findings: Secondary | ICD-10-CM

## 2019-09-13 DIAGNOSIS — K649 Unspecified hemorrhoids: Secondary | ICD-10-CM

## 2019-09-13 DIAGNOSIS — R319 Hematuria, unspecified: Secondary | ICD-10-CM | POA: Insufficient documentation

## 2019-09-13 HISTORY — DX: Gross hematuria: R31.0

## 2019-09-13 LAB — POCT URINALYSIS DIPSTICK
Glucose, UA: NEGATIVE
Ketones, UA: NEGATIVE
Nitrite, UA: NEGATIVE
Protein, UA: POSITIVE — AB
Spec Grav, UA: 1.01 (ref 1.010–1.025)
Urobilinogen, UA: 1 E.U./dL
pH, UA: 5.5 (ref 5.0–8.0)

## 2019-09-13 LAB — URINALYSIS, ROUTINE W REFLEX MICROSCOPIC
Bilirubin Urine: NEGATIVE
Glucose, UA: NEGATIVE mg/dL
Ketones, ur: NEGATIVE mg/dL
Nitrite: NEGATIVE
Protein, ur: 30 mg/dL — AB
Specific Gravity, Urine: 1.01 (ref 1.005–1.030)
pH: 6 (ref 5.0–8.0)

## 2019-09-13 NOTE — Assessment & Plan Note (Addendum)
Patient complaining of unclear source of bleeding for approximately 5-10 years. She states that she has large "gushes of blood" while on the toilet every few months, notices spotting in her underwear occasionally and has occasional streaks of blood on toilet paper and in the toilet. She is not certain whether the bleeding is from a vaginal source, with urination or with defecation. Per chart review, it appears that patient was previously diagnosed with postmenopausal vaginal bleeding with findings on a transvaginal ultrasound in 2019 showing a 6.5cm uterine fibroid and endometrial atrophy. She was routinely following with OBGYN, however she has not seen this office in approximately four years. However, she was noted to also have moderate hemoglobin on her prior two urinalyses. She has not had a repeat urinalysis since 2018. She denies symptoms of pain with urination, frequency or urgency. She does not have CVA tenderness on physical examination. Urinalysis Dipstick today in clinic reveals large blood, however there is also concern for possible contamination given a very foul odor. Due to the uncertain origin of her spotting, this raises concern for a urinary, vaginal or gastrointestinal source of her bleeding. -Urine culture -Urinalysis, reflex microscopic -Referral to OBGYN for re-establishing care and repeat TVUS and pap smear -Referral to GI for diagnostic colonoscopy

## 2019-09-13 NOTE — Progress Notes (Signed)
   CC: Routine follow-up, concern for spotting  HPI:  [name] is a 59 y.o. with complicated past medical history as listed below who presents to clinic for routine follow-up. Please, refer to Problem List for current state of patient's chronic medical conditions and any acute complaints addressed during today's visit.  Past Medical History:  Diagnosis Date  . Anemia, iron deficiency    secondary to menhorrhagia, on oral iron, also b12 def, getting monthly b12 shots  . Cellulitis 05/2019   RIGHT LOWER EXTREMITY  . CHF (congestive heart failure) ([place])   . Chronic cough    secondary to alleriges and post nasal drip  . Cognitive impairment   . COPD (chronic obstructive pulmonary disease) ([place])   . Cor pulmonale (HCC)    PA Peak pressure 54mHg  . Depression   . Diabetes mellitus    well controlled on metformin  . Diastolic heart failure ([place]   . GERD (gastroesophageal reflux disease)   . H/O mental retardation   . Herpes   . Hyperlipidemia   . Hypertension   . OSA (obstructive sleep apnea)    CPAP  . Pulmonary hypertension ([place]   . Renal disorder   . Restrictive lung disease    PFTs 06/2012 (FVC 54% predicted and FEV1 68% predicted w minimal bronchodilator response).  . Shortness of breath   . Venous stasis ulcer ([place]    chornic, ?followed up at wound care center, multiple courses of antibiotics in past for cellulitis, on lasix   Review of Systems:  Denies abdominal pain, pain with defecation, pain with urination, increased urinary frequency or urgency, lightheadedness, dizziness.  Physical Exam:  Vitals:   09/13/19 1440  BP: 112/67  Pulse: 94  Temp: 98.4 F (36.9 C)  TempSrc: Oral  SpO2: 96%  Weight: 278 lb 14.4 oz (126.5 kg)   Physical Exam Vitals reviewed.  Constitutional:      General: She is not in acute distress.    Appearance: She is obese.  Eyes:     Extraocular Movements: Extraocular movements intact.     Conjunctiva/sclera: Conjunctivae  normal.  Cardiovascular:     Rate and Rhythm: Normal rate and regular rhythm.     Pulses: Normal pulses.     Heart sounds: Normal heart sounds.  Pulmonary:     Effort: Pulmonary effort is normal.     Breath sounds: Normal breath sounds.  Abdominal:     General: Abdomen is flat. Bowel sounds are normal.     Palpations: Abdomen is soft.     Tenderness: There is no abdominal tenderness. There is no right CVA tenderness or left CVA tenderness.  Musculoskeletal:        General: Normal range of motion.  Skin:    General: Skin is warm and dry.     Capillary Refill: Capillary refill takes less than 2 seconds.  Neurological:     General: No focal deficit present.     Mental Status: She is alert. Mental status is at baseline.     Assessment & Plan:   See Encounters Tab for problem based charting.  Patient seen with Dr. MDaryll Drown

## 2019-09-13 NOTE — Assessment & Plan Note (Addendum)
Patient has not seen OBGYN in the past few years and is requesting to re-establish care with OBGYN for continued management of her suspected postmenopausal vaginal bleeding. Last pap smear in 2017 was unremarkable. TVUS in 2019 revealed a 6.5cm uterine fibroid and endomterial atrophy. She remains on xarelto for her history of atrial flutter. Additionally, she continues to complain of spotting from an unclear source. -Referral to OBGYN to re-establish care and repeat TVUS -Obtain pap smear from [facility] office

## 2019-09-13 NOTE — Assessment & Plan Note (Signed)
Patient is due for repeat screening mammogram, which was ordered today. Additionally, patient received influenza vaccination.

## 2019-09-13 NOTE — Patient Instructions (Addendum)
[name], it was a pleasure meeting you in clinic today. I have placed a referral for you to resume your care with OBGYN so you should receive a call from them shortly to schedule an appointment. Additionally, I have placed a referral for you to get a repeat colonoscopy with GI due to the concern of you potentially having bleeding from your gastrointestinal tract. Their office will call you to schedule an appointment. Finally, we have obtained some urine studies today in clinic, I will contact you with the results of these studies once received. If any new symptoms arise before your next appointment with [place], please don't hesitate to call.

## 2019-09-13 NOTE — Assessment & Plan Note (Signed)
ADDENDUM: Patient's urinalysis returned with large hemoglobin and 6-10 red blood cells present. This raises concern for a urinary source of her bleeding, although the sample may have some degree of contamination from stool. Her history complicates the current clinical picture given her diagnosed post-menopausal uterine bleeding and history of bleeding hemorrhoids. We will obtain CT Abdomen/Pelvis with contrast and we have placed a urology consultation for further evaluation. -CT Abdomen/Pelvis with contrast -Urology referral -Urine culture

## 2019-09-13 NOTE — Addendum Note (Signed)
Addended by: Paulla Dolly on: 09/13/2019 05:20 PM   Modules accepted: Orders

## 2019-09-14 LAB — URINE CULTURE

## 2019-09-14 NOTE — Progress Notes (Signed)
Internal Medicine Clinic Attending  I saw and evaluated the patient.  I personally confirmed the key portions of the history and exam documented by Dr. Wynetta Emery and I reviewed pertinent patient test results.  The assessment, diagnosis, and plan were formulated together and I agree with the documentation in the resident's note.   Given the number of referrals today, I would anticipate that the Urology referral could be cancelled if CT abdomen pelvis does not show a problem in the kidneys or urinary bladder.  GI referral can be cancelled if there is something + on CT scan for Urology to follow up on.

## 2019-09-17 ENCOUNTER — Other Ambulatory Visit: Payer: Self-pay | Admitting: Internal Medicine

## 2019-09-17 DIAGNOSIS — J069 Acute upper respiratory infection, unspecified: Secondary | ICD-10-CM

## 2019-09-20 ENCOUNTER — Telehealth: Payer: Self-pay | Admitting: Gastroenterology

## 2019-09-20 NOTE — Telephone Encounter (Signed)
Records reviewed. Referral to GI for diagnostic colonoscopy. Will need office visit prior to scheduling. Thank you.

## 2019-09-20 NOTE — Telephone Encounter (Signed)
Hey Dr Tarri Glenn, this pt is being referred to [place] by [place] for another colonoscopy, pt had a colonoscopy done in 2013, pt's notes are in epic to review. Please advise on scheduling.

## 2019-09-21 ENCOUNTER — Other Ambulatory Visit: Payer: Self-pay | Admitting: *Deleted

## 2019-09-21 MED ORDER — RIVAROXABAN 20 MG PO TABS
ORAL_TABLET | ORAL | 4 refills | Status: DC
Start: 2019-09-21 — End: 2020-02-27

## 2019-09-25 ENCOUNTER — Telehealth: Payer: Self-pay | Admitting: *Deleted

## 2019-09-25 NOTE — Telephone Encounter (Signed)
Patient is requesting her referral be changed from [place] to a location here in [place]. She does not drive the appointment is 10/25 .

## 2019-09-26 ENCOUNTER — Encounter: Payer: Self-pay | Admitting: Pulmonary Disease

## 2019-09-26 ENCOUNTER — Ambulatory Visit (INDEPENDENT_AMBULATORY_CARE_PROVIDER_SITE_OTHER): Payer: Medicaid Other | Admitting: Pulmonary Disease

## 2019-09-26 ENCOUNTER — Other Ambulatory Visit: Payer: Self-pay

## 2019-09-26 VITALS — BP 128/84 | HR 68 | Temp 97.6°F | Ht 63.0 in | Wt 281.6 lb

## 2019-09-26 DIAGNOSIS — G4733 Obstructive sleep apnea (adult) (pediatric): Secondary | ICD-10-CM | POA: Diagnosis not present

## 2019-09-26 NOTE — Patient Instructions (Addendum)
Continue with BiPAP on a regular basis  We will send a prescription for BiPAP supplies to [facility]  Needs a medium facemask to help with mask leak  Follow-up in 3 months  Call with significant concerns

## 2019-09-26 NOTE — Progress Notes (Signed)
[name]    [id number]    July 24, 1960  Primary Care Physician:Johnson, Rodman Key, MD  Referring Physician: [name], MD [address] [place],  [place] [address]  Chief complaint:   Patient with a history of obstructive sleep apnea History of pulmonary hypertension Chronic respiratory failure  HPI:  She is on AutoSet CPAP minimum pressure of 16, maximum pressure of 20  Very compliant with BiPAP use with 100% compliance Has not been having any real health issues Recently seen by primary  Reports multiple awakenings -On diuretics  History of pulmonary hypertension History of chronic systolic heart failure  Morbid obesity, cognitive impairment, diastolic heart failure, paroxysmal atrial fibrillation, cor pulmonale, severe pulmonary hypertension -Currently on treprostinil and sildenafil  Feels relatively well Sleeps well at night goes to bed about midnight 3-4 awakenings Weight fluctuates depending on fluid levels  Recent right heart cart noted from cardiology documentation  She is on 4 L oxygen Recent exercise study reveals 68% predicted Able to walk about 263 m  She has been on BiPAP now for about 6 years.  Outpatient Encounter Medications as of 09/26/2019  Medication Sig  . ACCU-CHEK AVIVA PLUS test strip USE 1 STRIP TO CHECK GLUCOSE ONCE DAILY  . acetaminophen (TYLENOL) 500 MG tablet Take 1 tablet (500 mg total) by mouth every 6 (six) hours as needed.  . cetirizine (ZYRTEC) 10 MG tablet Take 1 tablet by mouth once daily  . cyanocobalamin 500 MCG tablet Take 500 mcg by mouth daily. Vitamin b12  . cycloSPORINE (RESTASIS) 0.05 % ophthalmic emulsion Place 1 drop into both eyes 2 (two) times daily as needed (dry eyes).  . diphenhydrAMINE (BENADRYL) 25 MG tablet Take 50-100 mg by mouth 2 (two) times daily as needed for itching.  . fluticasone (FLONASE) 50 MCG/ACT nasal spray Place 1 spray into both nostrils daily.  Marland Kitchen gabapentin (NEURONTIN) 300 MG  capsule TAKE 2 CAPSULES BY MOUTH THREE TIMES DAILY  . metFORMIN (GLUCOPHAGE) 500 MG tablet TAKE 1/2 (ONE-HALF) TABLET BY MOUTH WITH BREAKFAST  . OXYGEN Inhale 4 L into the lungs continuous.  . pantoprazole (PROTONIX) 20 MG tablet Take 1 tablet (20 mg total) by mouth 2 (two) times daily.  . potassium chloride SA (KLOR-CON) 20 MEQ tablet Take 2 tablets (40 mEq total) by mouth 3 (three) times daily.  . pravastatin (PRAVACHOL) 20 MG tablet Take 1 tablet by mouth once daily  . rivaroxaban (XARELTO) 20 MG TABS tablet TAKE 1 TABLET BY MOUTH ONCE DAILY WITH SUPPER  . sildenafil (REVATIO) 20 MG tablet Take 1 tablet (20 mg total) by mouth 3 (three) times daily.  Marland Kitchen spironolactone (ALDACTONE) 25 MG tablet Take 1/2 (one-half) tablet by mouth once daily  . torsemide (DEMADEX) 20 MG tablet Take 4 tablets (80 mg total) by mouth See admin instructions. Take 4 tablets by mouth twice daily  . treprostinil (REMODULIN) 5 MG/ML SOLN injection See admin instructions. As of 08/09/2019: Add 7 ml of Remodulin to cassette and 93 ml of sterile diluent for Remodulin to cassetet for a total volume of 100 ml to make a concentration of 350,000 ng per ml.  Infuse via a CADD pump intravenously at a rate of 40 ml per 24 hours. Based on a dosing weight of 120 kg, the dose is 81 ng per kg per min.  Once opened, discard Remodulin vial after 30 days.  Sterile Diluent for Remodulin vials are single use only. Once mixed, discard cassette after 48  hours.  . [DISCONTINUED] cetirizine (ZYRTEC) 10 MG tablet Take 1 tablet by mouth once daily   No facility-administered encounter medications on file as of 09/26/2019.    Allergies as of 09/26/2019 - Review Complete 09/26/2019  Allergen Reaction Noted  . Aspirin Swelling 12/09/2005  . Codeine Other (See Comments)   . Lisinopril Cough 05/02/2012  . Sulfonamide derivatives Swelling 10/19/2005  . Latex Rash 01/07/2016    Past Medical History:  Diagnosis Date  . Anemia, iron deficiency     secondary to menhorrhagia, on oral iron, also b12 def, getting monthly b12 shots  . Cellulitis 05/2019   RIGHT LOWER EXTREMITY  . CHF (congestive heart failure) ([place])   . Chronic cough    secondary to alleriges and post nasal drip  . Cognitive impairment   . COPD (chronic obstructive pulmonary disease) ([place])   . Cor pulmonale (HCC)    PA Peak pressure 61mHg  . Depression   . Diabetes mellitus    well controlled on metformin  . Diastolic heart failure ([place]   . GERD (gastroesophageal reflux disease)   . H/O mental retardation   . Herpes   . Hyperlipidemia   . Hypertension   . OSA (obstructive sleep apnea)    CPAP  . Pulmonary hypertension ([place]   . Renal disorder   . Restrictive lung disease    PFTs 06/2012 (FVC 54% predicted and FEV1 68% predicted w minimal bronchodilator response).  . Shortness of breath   . Venous stasis ulcer ([place]    chornic, ?followed up at wound care center, multiple courses of antibiotics in past for cellulitis, on lasix    Past Surgical History:  Procedure Laterality Date  . CARDIAC CATHETERIZATION N/A 01/13/2016   Procedure: Right Heart Cath;  Surgeon: DJolaine Artist MD;  [facility];  Service: Cardiovascular;  Laterality: N/A;  . CHOLECYSTECTOMY    . IR FLUORO GUIDE CV LINE RIGHT  05/09/2019  . IR FLUORO GUIDE CV LINE RIGHT  08/12/2019  . IR REMOVAL TUN CV CATH W/O FL  05/05/2019  . IR [place] ACCESS RIGHT  05/09/2019  . IR [place] V[place] ACCESS RIGHT  08/12/2019  . LEFT AND RIGHT HEART CATHETERIZATION WITH CORONARY ANGIOGRAM N/A 02/28/2014   Procedure: LEFT AND RIGHT HEART CATHETERIZATION WITH CORONARY ANGIOGRAM;  Surgeon: DJolaine Artist MD;  [facility];  Service: Cardiovascular;  Laterality: N/A;  . RIGHT HEART CATH N/A 06/27/2018   Procedure: RIGHT HEART CATH;  Surgeon: BJolaine Artist MD;  [facility];  Service: Cardiovascular;  Laterality: N/A;  . RIGHT/LEFT HEART CATH AND CORONARY  ANGIOGRAPHY N/A 05/14/2019   Procedure: RIGHT/LEFT HEART CATH AND CORONARY ANGIOGRAPHY;  Surgeon: BJolaine Artist MD;  [facility];  Service: Cardiovascular;  Laterality: N/A;    Family History  Problem Relation Age of Onset  . Kidney Stones Son   . Mental illness Sister   . Bipolar disorder Sister   . Mental retardation Brother   . Hyperlipidemia Mother   . Breast cancer Maternal Aunt     Social History   Socioeconomic History  . Marital status: Single    Spouse name: Not on file  . Number of children: Not on file  . Years of education: Not on file  . Highest education level: Not on file  Occupational History  . Not on file  Tobacco Use  . Smoking status: Never Smoker  . Smokeless tobacco: Never Used  Vaping Use  . Vaping Use: Never used  Substance and Sexual Activity  . Alcohol use: No    Alcohol/week: 0.0 standard drinks  . Drug use: No  . Sexual activity: Not Currently  Other Topics Concern  . Not on file  Social History Narrative  . Not on file   Social Determinants of Health   Financial Resource Strain:   . Difficulty of Paying Living Expenses: Not on file  Food Insecurity:   . Worried About Charity fundraiser in the Last Year: Not on file  . Ran Out of Food in the Last Year: Not on file  Transportation Needs:   . Lack of Transportation (Medical): Not on file  . Lack of Transportation (Non-Medical): Not on file  Physical Activity:   . Days of Exercise per Week: Not on file  . Minutes of Exercise per Session: Not on file  Stress:   . Feeling of Stress : Not on file  Social Connections:   . Frequency of Communication with Friends and Family: Not on file  . Frequency of Social Gatherings with Friends and Family: Not on file  . Attends Religious Services: Not on file  . Active Member of Clubs or Organizations: Not on file  . Attends Archivist Meetings: Not on file  . Marital Status: Not on file  Intimate Partner Violence:    . Fear of Current or Ex-Partner: Not on file  . Emotionally Abused: Not on file  . Physically Abused: Not on file  . Sexually Abused: Not on file    Review of Systems  Constitutional: Positive for fatigue.  Respiratory: Positive for apnea and shortness of breath.   Cardiovascular: Positive for leg swelling.  Gastrointestinal: Negative.   Genitourinary: Negative.   Musculoskeletal: Positive for arthralgias.  Psychiatric/Behavioral: Positive for sleep disturbance.    Vitals:   09/26/19 1106  BP: 128/84  Pulse: 68  Temp: 97.6 F (36.4 C)  SpO2: 90%     Physical Exam Constitutional:      Appearance: She is obese.  HENT:     Head: Normocephalic.  Cardiovascular:     Rate and Rhythm: Normal rate and regular rhythm.     Pulses: Normal pulses.     Heart sounds: No murmur heard.   Pulmonary:     Effort: Pulmonary effort is normal. No respiratory distress.     Breath sounds: Normal breath sounds. No stridor. No wheezing or rhonchi.  Musculoskeletal:        General: Swelling present. Normal range of motion.     Cervical back: No rigidity or tenderness.  Skin:    General: Skin is warm.  Neurological:     Mental Status: She is alert.    Results of the Epworth flowsheet 08/22/2019  Sitting and reading 0  Watching TV 1  Sitting, inactive in a public place (e.g. a theatre or a meeting) 2  As a passenger in a car for an hour without a break 2  Lying down to rest in the afternoon when circumstances permit 1  Sitting and talking to someone 0  Sitting quietly after a lunch without alcohol 1  In a car, while stopped for a few minutes in traffic 0  Total score 7   Data Reviewed: Right heart catheterization results noted  Care everywhere report by Dr. Gilles Chiquito reviewed  Compliance data reveals 100% compliance AutoSet 16-20 95 percentile pressure of 17.4, maximum pressure of 17.9 AHI of 9.5, better than AHI of 12  on previous There is some significant air leak  Assessment:   History of obstructive sleep apnea on CPAP therapy She is on auto CPAP 16-20 Compliance is 100%  Pulmonary hypertension -On Remodulin, sildenafil  Diastolic heart failure Remains on diuretics-torsemide 80 mg twice daily  Morbid obesity -Ambulates with a walker -Tries to stay active  Atrial flutter -On anticoagulation  Deconditioning -Encouraged to stay active  Plan/Recommendations:  Prescription to [facility] for mask change -She will require a medium size mask  Continue CPAP on AutoSet settings  Follow-up in 3 months   Sherrilyn Rist MD  Pulmonary and Critical Care 09/26/2019, 11:40 AM  CC: [name], MD

## 2019-09-27 ENCOUNTER — Other Ambulatory Visit: Payer: Self-pay | Admitting: Student

## 2019-09-27 ENCOUNTER — Encounter: Payer: Self-pay | Admitting: Gastroenterology

## 2019-09-27 DIAGNOSIS — R310 Gross hematuria: Secondary | ICD-10-CM

## 2019-10-05 DIAGNOSIS — I5030 Unspecified diastolic (congestive) heart failure: Secondary | ICD-10-CM | POA: Diagnosis not present

## 2019-10-05 DIAGNOSIS — J984 Other disorders of lung: Secondary | ICD-10-CM | POA: Diagnosis not present

## 2019-10-05 DIAGNOSIS — I270 Primary pulmonary hypertension: Secondary | ICD-10-CM | POA: Diagnosis not present

## 2019-10-05 DIAGNOSIS — G4730 Sleep apnea, unspecified: Secondary | ICD-10-CM | POA: Diagnosis not present

## 2019-10-13 ENCOUNTER — Other Ambulatory Visit: Payer: Self-pay | Admitting: Internal Medicine

## 2019-10-15 ENCOUNTER — Other Ambulatory Visit (HOSPITAL_COMMUNITY)
Admission: RE | Admit: 2019-10-15 | Discharge: 2019-10-15 | Disposition: A | Discharge status: Home / Self Care | Payer: Medicaid Other | Source: Ambulatory Visit | Attending: Obstetrics and Gynecology | Admitting: Obstetrics and Gynecology

## 2019-10-15 ENCOUNTER — Other Ambulatory Visit: Payer: Self-pay

## 2019-10-15 ENCOUNTER — Encounter: Payer: Self-pay | Admitting: Obstetrics and Gynecology

## 2019-10-15 ENCOUNTER — Ambulatory Visit (INDEPENDENT_AMBULATORY_CARE_PROVIDER_SITE_OTHER): Payer: Medicaid Other | Admitting: Obstetrics and Gynecology

## 2019-10-15 VITALS — BP 106/70 | HR 80 | Ht 63.0 in | Wt 283.0 lb

## 2019-10-15 DIAGNOSIS — N939 Abnormal uterine and vaginal bleeding, unspecified: Secondary | ICD-10-CM | POA: Diagnosis not present

## 2019-10-15 DIAGNOSIS — L280 Lichen simplex chronicus: Secondary | ICD-10-CM | POA: Insufficient documentation

## 2019-10-15 HISTORY — DX: Abnormal uterine and vaginal bleeding, unspecified: N93.9

## 2019-10-15 HISTORY — DX: Lichen simplex chronicus: L28.0

## 2019-10-15 MED ORDER — CLOBETASOL PROPIONATE 0.05 % EX OINT: 1.0000 "application " | TOPICAL_OINTMENT | CUTANEOUS | 2 refills | Status: DC

## 2019-10-15 NOTE — Progress Notes (Signed)
States bleeding comes and goes. States they found polyps and was told not to worry about it.

## 2019-10-15 NOTE — Progress Notes (Signed)
Obstetrics and Gynecology New Patient Evaluation  Appointment Date: 10/15/2019  OBGYN Clinic: [facility]  Primary Care Provider: [name]  Referring Provider: Aldine Contes, MD  Chief Complaint: ?postmenopausal bleeding  History of Present Illness: [name] is a 59 y.o. Caucasian G1P1001 (Patient's last menstrual period was 02/19/2011.), seen for the above chief complaint. Her past medical history is significant for multiple medical co-moribidities  Patient states she's had years long history of bleeding from down below. She thinks it could be vaginal or potentially from her hemorrhoids. Pt unsure of exact time of menopause but maybe in her late 26s and she believes she took HRT for brief time.  Last bleeding was yesterday and she states it was bright and about the size of a dime. She never has any pain and doesn't bleed every day. She states that she has had episode of "gushes" in the past that was like the first day of a period.    She was last seen in 08/2016 by GYN for chronic vaginitis and put on nystatin. She had a pelvic u/s by her pcp in 05/2017 for these episodes with ES of 33m with a 6cm fibroid seen. Prior to this she was seen by GYN in 2017 for PMB. No exam done and plan was to f/u in 655mU/s at that time showed stable uterine size but endometrium not visualized.   She is currently on xarelto for a-fib.   04/2019: yeast on swab 2017: fsh 64 04/2014: Pap neg and hpv neg  No discharge. Some vaginal itching.   Review of Systems: Pertinent items noted in HPI and remainder of comprehensive ROS otherwise negative.    Patient Active Problem List   Diagnosis Date Noted  . Bleeding from the genitourinary system 09/13/2019  . Gross hematuria 09/13/2019  . Chronic venous stasis 05/07/2019  . Urinary incontinence 12/12/2018  . 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 Acute on chronic  respiratory failure (HCWellington06/11/2018  . Ovarian cyst 05/19/2017  . Peripheral neuropathy (HCCaldwell09/23/2016  . Healthcare maintenance 09/27/2014  . Acute on chronic diastolic (congestive) heart failure (HCNiles06/22/2015  . Atrial flutter (HCPryor08/14/2014  . Pulmonary hypertension (HCFarmington08/13/2014  . Right heart failure, NYHA class 3 (HCRiverdale03/12/2012  . Allergic rhinitis 11/16/2011  . Diabetes (HCSharon10/11/2010  . Hyperlipidemia 10/15/2010  . Obesity 10/14/2010  . Obstructive sleep apnea 02/21/2009  . GERD 11/29/2005    Past Medical History:  Past Medical History:  Diagnosis Date  . Anemia, iron deficiency    secondary to menhorrhagia, on oral iron, also b12 def, getting monthly b12 shots  . Cellulitis 05/2019   RIGHT LOWER EXTREMITY  . CHF (congestive heart failure) (HCOrient  . Chronic cough    secondary to alleriges and post nasal drip  . Cognitive impairment   . COPD (chronic obstructive pulmonary disease) (HCBenjamin  . Cor pulmonale (HCC)    PA Peak pressure 8981m  . Depression   . Diabetes mellitus    well controlled on metformin  . Diastolic heart failure (HCCMontgomery . GERD (gastroesophageal reflux disease)   . H/O mental retardation   . Herpes   . Hyperlipidemia   . Hypertension   . OSA (obstructive sleep apnea)    CPAP  . Pulmonary hypertension (HCCTowson . Renal disorder   . Restrictive lung disease    PFTs 06/2012 (FVC 54% predicted and FEV1 68% predicted w minimal  bronchodilator response).  . Shortness of breath   . Venous stasis ulcer ([place])    chornic, ?followed up at wound care center, multiple courses of antibiotics in past for cellulitis, on lasix    Past Surgical History:  Past Surgical History:  Procedure Laterality Date  . CARDIAC CATHETERIZATION N/A 01/13/2016   Procedure: Right Heart Cath;  Surgeon: Jolaine Artist, MD;  Location: [place] [facility];  Service: Cardiovascular;  Laterality: N/A;  . CHOLECYSTECTOMY    . IR FLUORO GUIDE CV LINE RIGHT  05/09/2019   . IR FLUORO GUIDE CV LINE RIGHT  08/12/2019  . IR REMOVAL TUN CV CATH W/O FL  05/05/2019  . IR US GUIDE VASC ACCESS RIGHT  05/09/2019  . IR US GUIDE VASC ACCESS RIGHT  08/12/2019  . LEFT AND RIGHT HEART CATHETERIZATION WITH CORONARY ANGIOGRAM N/A 02/28/2014   Procedure: LEFT AND RIGHT HEART CATHETERIZATION WITH CORONARY ANGIOGRAM;  Surgeon: Jolaine Artist, MD;  [facility] C[facility];  Service: Cardiovascular;  Laterality: N/A;  . RIGHT HEART CATH N/A 06/27/2018   Procedure: RIGHT HEART CATH;  Surgeon: Jolaine Artist, MD;  Location: [place] [facility];  Service: Cardiovascular;  Laterality: N/A;  . RIGHT/LEFT HEART CATH AND CORONARY ANGIOGRAPHY N/A 05/14/2019   Procedure: RIGHT/LEFT HEART CATH AND CORONARY ANGIOGRAPHY;  Surgeon: Jolaine Artist, MD;  Location: [place] [facility];  Service: Cardiovascular;  Laterality: N/A;    Past Obstetrical History:  OB History  Gravida Para Term Preterm AB Living  _0 SAB TAB Ectopic Multiple Live Births               # Outcome Date GA Lbr Len/2nd Weight Sex Delivery Anes PTL Lv  1 Term 80     Vag-Spont        Birth Comments: had gallstones     Past Gynecological History: As per HPI.  Social History:  Social History   Socioeconomic History  . Marital status: Single    Spouse name: Not on file  . Number of children: Not on file  . Years of education: Not on file  . Highest education level: Not on file  Occupational History  . Not on file  Tobacco Use  . Smoking status: Never Smoker  . Smokeless tobacco: Never Used  Vaping Use  . Vaping Use: Never used  Substance and Sexual Activity  . Alcohol use: No    Alcohol/week: 0.0 standard drinks  . Drug use: No  . Sexual activity: Not Currently  Other Topics Concern  . Not on file  Social History Narrative  . Not on file   Social Determinants of Health   Financial Resource Strain:   . Difficulty of Paying Living Expenses: Not on file  Food Insecurity:   . Worried About  Charity fundraiser in the Last Year: Not on file  . Ran Out of Food in the Last Year: Not on file  Transportation Needs:   . Lack of Transportation (Medical): Not on file  . Lack of Transportation (Non-Medical): Not on file  Physical Activity:   . Days of Exercise per Week: Not on file  . Minutes of Exercise per Session: Not on file  Stress:   . Feeling of Stress : Not on file  Social Connections:   . Frequency of Communication with Friends and Family: Not on file  . Frequency of Social Gatherings with Friends and Family: Not on file  .  Attends Religious Services: Not on file  . Active Member of Clubs or Organizations: Not on file  . Attends Archivist Meetings: Not on file  . Marital Status: Not on file  Intimate Partner Violence:   . Fear of Current or Ex-Partner: Not on file  . Emotionally Abused: Not on file  . Physically Abused: Not on file  . Sexually Abused: Not on file    Family History:  Family History  Problem Relation Age of Onset  . Kidney Stones Son   . Mental illness Sister   . Bipolar disorder Sister   . Mental retardation Brother   . Hyperlipidemia Mother   . Breast cancer Maternal Aunt     Medications [name] had no medications administered during this visit. Current Outpatient Medications  Medication Sig Dispense Refill  . ACCU-CHEK AVIVA PLUS test strip USE 1 STRIP TO CHECK GLUCOSE ONCE DAILY 50 each 3  . acetaminophen (TYLENOL) 500 MG tablet Take 1 tablet (500 mg total) by mouth every 6 (six) hours as needed. 30 tablet 0  . cetirizine (ZYRTEC) 10 MG tablet Take 1 tablet by mouth once daily 90 tablet 0  . cyanocobalamin 500 MCG tablet Take 500 mcg by mouth daily. Vitamin b12    . cycloSPORINE (RESTASIS) 0.05 % ophthalmic emulsion Place 1 drop into both eyes 2 (two) times daily as needed (dry eyes).    . diphenhydrAMINE (BENADRYL) 25 MG tablet Take 50-100 mg by mouth 2 (two) times daily as needed for itching.    . fluticasone  (FLONASE) 50 MCG/ACT nasal spray Place 1 spray into both nostrils daily. 16 g 2  . gabapentin (NEURONTIN) 300 MG capsule TAKE 2 CAPSULES BY MOUTH THREE TIMES DAILY 540 capsule 0  . metFORMIN (GLUCOPHAGE) 500 MG tablet TAKE 1/2 (ONE-HALF) TABLET BY MOUTH WITH BREAKFAST 45 tablet 0  . OXYGEN Inhale 4 L into the lungs continuous.    . pantoprazole (PROTONIX) 20 MG tablet Take 1 tablet (20 mg total) by mouth 2 (two) times daily. 180 tablet 3  . potassium chloride SA (KLOR-CON) 20 MEQ tablet Take 2 tablets (40 mEq total) by mouth 3 (three) times daily. 180 tablet 0  . pravastatin (PRAVACHOL) 20 MG tablet Take 1 tablet by mouth once daily 90 tablet 3  . rivaroxaban (XARELTO) 20 MG TABS tablet TAKE 1 TABLET BY MOUTH ONCE DAILY WITH SUPPER 30 tablet 4  . sildenafil (REVATIO) 20 MG tablet Take 1 tablet (20 mg total) by mouth 3 (three) times daily. 270 tablet 3  . spironolactone (ALDACTONE) 25 MG tablet Take 1/2 (one-half) tablet by mouth once daily 45 tablet 0  . torsemide (DEMADEX) 20 MG tablet Take 4 tablets (80 mg total) by mouth See admin instructions. Take 4 tablets by mouth twice daily 240 tablet 0  . treprostinil (REMODULIN) 5 MG/ML SOLN injection See admin instructions. As of 08/09/2019: Add 7 ml of Remodulin to cassette and 93 ml of sterile diluent for Remodulin to cassetet for a total volume of 100 ml to make a concentration of 350,000 ng per ml.  Infuse via a CADD pump intravenously at a rate of 40 ml per 24 hours. Based on a dosing weight of 120 kg, the dose is 81 ng per kg per min.  Once opened, discard Remodulin vial after 30 days.  Sterile Diluent for Remodulin vials are single use only. Once mixed, discard cassette after 48 hours.     No current facility-administered medications for this visit.  Allergies Aspirin, Codeine, Lisinopril, Sulfonamide derivatives, and Latex   Physical Exam:  BP 106/70   Pulse 80   Ht _0  (1.6 m)   Wt 283 lb (128.4 kg)   LMP 02/19/2011   BMI 50.13 kg/m   Body mass index is 50.13 kg/m. General appearance: Well nourished, well developed female in no acute distress.  Respiratory:  Patient wearing supplement nasal cannula. Normal respiratory effort Abdomen: obese, nttp, nd Neuro/Psych:  Normal mood and affect.  Skin:  Warm and dry.  Lymphatic:  No inguinal lymphadenopathy.   Pelvic exam: is limited by body habitus EGBUS: very atrophic. Slight pink to entire b/l vulva and 2m healing spot on right labia majora at 3 o'clock.  Vagina: atrophic. No blood. Slight white d/c Cervix: it is very high up and I was only able to get a glimpse of it with using a tenaculum. Silver nitrate applied to site. Pap smear done but endometrial biopsy not attempted.  Laboratory: as per HPI  Radiology: no new imaging Narrative & Impression  CLINICAL DATA:  Initial evaluation for postmenopausal vaginal bleeding.  EXAM: TRANSABDOMINAL AND TRANSVAGINAL ULTRASOUND OF PELVIS  TECHNIQUE: Both transabdominal and transvaginal ultrasound examinations of the pelvis were performed. Transabdominal technique was performed for global imaging of the pelvis including uterus, ovaries, adnexal regions, and pelvic cul-de-sac. It was necessary to proceed with endovaginal exam following the transabdominal exam to visualize the uterus, endometrium, and ovaries.  COMPARISON:  Prior ultrasound from 04/14/2015  FINDINGS: Uterus  Measurements: 10.8 x 7.0 x 7.0 cm. 6.5 x 6.2 x 6.2 cm fibroid within the central aspect of the uterus.  Endometrium  Thickness: 4 mm. Endometrial stripe distorted by the adjacent fibroid.  Right ovary  Measurements: 3.2 x 2.8 x 2.6 cm. Normal appearance/no adnexal mass.  Left ovary  Measurements: 5.7 x 3.9 x 4.4 cm. 3.7 x 3.5 x 3.5 cm simple anechoic cyst. Mural echogenic density may reflect associated mural calcification. No other significant internal complexity. No vascularity.  Other findings  No abnormal free  fluid.  IMPRESSION: 1. 6.5 cm uterine fibroid. 2. Endometrial stripe measures 4 mm, distorted by the adjacent fibroid. In the setting of post-menopausal bleeding, this is consistent with a benign etiology such as endometrial atrophy. If bleeding remains unresponsive to hormonal or medical therapy, sonohysterogram should be considered for focal lesion work-up. (Ref: Radiological Reasoning: Algorithmic Workup of Abnormal Vaginal Bleeding with Endovaginal Sonography and Sonohysterography. AJR 2008;; 242:P53-61. 3. 3.7 cm simple left ovarian cyst. While this is almost certainly benign, a follow up ultrasound is recommended in 1 year according to the Society of Radiologists in [place]Statement (D LClovis Rileyet al. Management of Asymptomatic Ovarian and Other Adnexal Cysts Imaged at [place] Society of Radiologists in U[place]olloStatement 2010. Radiology 256 (Sept 2010): 9443-154).   Electronically Signed   By: BJeannine BogaM.D.   On: 05/07/2017 21:52     Assessment: pt stable  Plan:  1. Vagina bleeding Follow up adequacy of pap sampling. I told her that will base decision on u/s findings. If u/s shows thin, homogenous stripe then may be able to defer surgical sampling - Cytology - PAP( [place]) - [place]LVIC COMPLETE WITH TRANSVAGINAL; Future - Cervicovaginal ancillary only( [place])  2. Lichen simplex Temovate sent in and f/u in 377mOrders Placed This Encounter  Procedures  . [place]VIC COMPLETE WITH TRANSVAGINAL    RTC after u/s.   ChDurene RomansD Attending [facility]  Healthcare Fish farm manager)

## 2019-10-16 ENCOUNTER — Other Ambulatory Visit: Payer: Self-pay

## 2019-10-16 DIAGNOSIS — E7800 Pure hypercholesterolemia, unspecified: Secondary | ICD-10-CM

## 2019-10-16 LAB — CERVICOVAGINAL ANCILLARY ONLY
Bacterial Vaginitis (gardnerella): NEGATIVE
Candida Glabrata: NEGATIVE
Candida Vaginitis: NEGATIVE
Chlamydia: NEGATIVE
Comment: NEGATIVE
Comment: NEGATIVE
Comment: NEGATIVE
Comment: NEGATIVE
Comment: NEGATIVE
Comment: NORMAL
Neisseria Gonorrhea: NEGATIVE
Trichomonas: NEGATIVE

## 2019-10-16 MED ORDER — PRAVASTATIN SODIUM 20 MG PO TABS: 20.0000 mg | ORAL_TABLET | Freq: Every day | ORAL | 1 refills | Status: DC

## 2019-10-17 LAB — CYTOLOGY - PAP
Comment: NEGATIVE
Diagnosis: NEGATIVE
High risk HPV: NEGATIVE

## 2019-10-19 ENCOUNTER — Other Ambulatory Visit: Payer: Self-pay | Admitting: *Deleted

## 2019-10-19 DIAGNOSIS — I50810 Right heart failure, unspecified: Secondary | ICD-10-CM

## 2019-10-21 MED ORDER — SPIRONOLACTONE 25 MG PO TABS: ORAL_TABLET | ORAL | 0 refills | Status: DC

## 2019-10-24 ENCOUNTER — Ambulatory Visit: Payer: Medicaid Other

## 2019-10-24 DIAGNOSIS — N3946 Mixed incontinence: Secondary | ICD-10-CM | POA: Diagnosis not present

## 2019-10-24 DIAGNOSIS — R310 Gross hematuria: Secondary | ICD-10-CM | POA: Diagnosis not present

## 2019-10-25 ENCOUNTER — Ambulatory Visit
Admission: RE | Admit: 2019-10-25 | Discharge: 2019-10-25 | Disposition: A | Discharge status: Home / Self Care | Payer: Medicaid Other | Source: Ambulatory Visit | Attending: Obstetrics and Gynecology | Admitting: Obstetrics and Gynecology

## 2019-10-25 ENCOUNTER — Encounter: Payer: Self-pay | Admitting: *Deleted

## 2019-10-25 ENCOUNTER — Other Ambulatory Visit: Payer: Self-pay

## 2019-10-25 DIAGNOSIS — N939 Abnormal uterine and vaginal bleeding, unspecified: Secondary | ICD-10-CM

## 2019-10-25 DIAGNOSIS — D259 Leiomyoma of uterus, unspecified: Secondary | ICD-10-CM | POA: Diagnosis not present

## 2019-10-25 DIAGNOSIS — N838 Other noninflammatory disorders of ovary, fallopian tube and broad ligament: Secondary | ICD-10-CM | POA: Diagnosis not present

## 2019-10-25 DIAGNOSIS — N950 Postmenopausal bleeding: Secondary | ICD-10-CM | POA: Diagnosis not present

## 2019-10-29 ENCOUNTER — Ambulatory Visit: Payer: Medicaid Other | Admitting: Urology

## 2019-10-30 LAB — HM DIABETES EYE EXAM

## 2019-11-05 ENCOUNTER — Encounter: Payer: Self-pay | Admitting: *Deleted

## 2019-11-05 DIAGNOSIS — D259 Leiomyoma of uterus, unspecified: Secondary | ICD-10-CM | POA: Diagnosis not present

## 2019-11-05 DIAGNOSIS — J984 Other disorders of lung: Secondary | ICD-10-CM | POA: Diagnosis not present

## 2019-11-05 DIAGNOSIS — R310 Gross hematuria: Secondary | ICD-10-CM | POA: Diagnosis not present

## 2019-11-05 DIAGNOSIS — G4730 Sleep apnea, unspecified: Secondary | ICD-10-CM | POA: Diagnosis not present

## 2019-11-05 DIAGNOSIS — I270 Primary pulmonary hypertension: Secondary | ICD-10-CM | POA: Diagnosis not present

## 2019-11-05 DIAGNOSIS — I5030 Unspecified diastolic (congestive) heart failure: Secondary | ICD-10-CM | POA: Diagnosis not present

## 2019-11-05 DIAGNOSIS — L989 Disorder of the skin and subcutaneous tissue, unspecified: Secondary | ICD-10-CM | POA: Diagnosis not present

## 2019-11-05 DIAGNOSIS — K7689 Other specified diseases of liver: Secondary | ICD-10-CM | POA: Diagnosis not present

## 2019-11-07 ENCOUNTER — Encounter: Payer: Self-pay | Admitting: Obstetrics and Gynecology

## 2019-11-07 ENCOUNTER — Ambulatory Visit: Payer: Medicaid Other | Admitting: Urology

## 2019-11-07 DIAGNOSIS — D259 Leiomyoma of uterus, unspecified: Secondary | ICD-10-CM | POA: Insufficient documentation

## 2019-11-08 ENCOUNTER — Other Ambulatory Visit: Payer: Self-pay | Admitting: *Deleted

## 2019-11-08 ENCOUNTER — Other Ambulatory Visit: Payer: Self-pay

## 2019-11-08 NOTE — Patient Outreach (Signed)
Care Coordination - Case Manager  11/08/2019  BERANIA PEEDIN [birth year] [id number]  Subjective:  NIMA BAMBURG is an 59 y.o. year old female who is a primary patient of [name], MD.  Ms. Strohecker was given information about Medicaid Managed Care team care coordination services today. [name] agreed to services and verbal consent obtained  Review of patient status, laboratory and other test data was performed as part of evaluation for provision of services.  SDOH: SDOH Screenings   Alcohol Screen:   . Last Alcohol Screening Score (AUDIT): Not on file  Depression (PHQ2-9): Medium Risk  . PHQ-2 Score: 13  Financial Resource Strain:   . Difficulty of Paying Living Expenses: Not on file  Food Insecurity: No Food Insecurity  . Worried About Charity fundraiser in the Last Year: Never true  . Ran Out of Food in the Last Year: Never true  Housing: Low Risk   . Last Housing Risk Score: 0  Physical Activity:   . Days of Exercise per Week: Not on file  . Minutes of Exercise per Session: Not on file  Social Connections: Moderately Isolated  . Frequency of Communication with Friends and Family: Once a week  . Frequency of Social Gatherings with Friends and Family: More than three times a week  . Attends Religious Services: 1 to 4 times per year  . Active Member of Clubs or Organizations: No  . Attends Archivist Meetings: Never  . Marital Status: Never married  Stress:   . Feeling of Stress : Not on file  Tobacco Use: Low Risk   . Smoking Tobacco Use: Never Smoker  . Smokeless Tobacco Use: Never Used  Transportation Needs: No Transportation Needs  . Lack of Transportation (Medical): No  . Lack of Transportation (Non-Medical): No   SDOH Interventions     Most Recent Value  SDOH Interventions  Food Insecurity Interventions Intervention Not Indicated  Housing Interventions Intervention Not Indicated  Social Connections Interventions Intervention Not  Indicated  Transportation Interventions Intervention Not Indicated      Objective:    Allergies  Allergen Reactions  . Aspirin Swelling    REACTION: airway swelling  . Codeine Other (See Comments)    REACTION: tingling in lips and hard breathing - had reaction at dentist - states "I can't take certain kinds of codeine" - happened maybe 10 yr ago  . Lisinopril Cough  . Sulfonamide Derivatives Swelling    REACTION: airway swelling  . Latex Rash    Medications:    Medications Reviewed Today    Reviewed by Melissa Montane, RN (Registered Nurse) on 11/08/19 at 1500  Med List Status: <None>  Medication Order Taking? Sig Documenting Provider Last Dose Status Informant  ACCU-CHEK AVIVA PLUS test strip [id number] No USE 1 STRIP TO CHECK GLUCOSE ONCE DAILY  Patient not taking: Reported on 11/08/2019   [name], MD Not Taking Active Other  acetaminophen (TYLENOL) 500 MG tablet [id number] Yes Take 1 tablet (500 mg total) by mouth every 6 (six) hours as needed. [name], MD Taking Active Self        Discontinued 01/29/19 1321 (Reorder)   cetirizine (ZYRTEC) 10 MG tablet [id number] Yes Take 1 tablet by mouth once daily [name], [name], DO Taking Active   clobetasol ointment (TEMOVATE) 0.05 % [id number] Yes Apply 1 application topically as directed. Apply to vulva: qhs x 4wks & then every other night x 4wks, then 2x/week [name], MD Taking  Active   cyanocobalamin 500 MCG tablet [id number] Yes Take 500 mcg by mouth daily. Vitamin b12 [provider] Taking Active Self  cycloSPORINE (RESTASIS) 0.05 % ophthalmic emulsion [id number] Yes Place 1 drop into both eyes 2 (two) times daily as needed (dry eyes). [provider] Taking Active Self  diphenhydrAMINE (BENADRYL) 25 MG tablet [id number] Yes Take 50-100 mg by mouth 2 (two) times daily as needed for itching. [provider] Taking Active Self  fluticasone (FLONASE) 50 MCG/ACT nasal spray [id number] Yes  Place 1 spray into both nostrils daily. [name], MD Taking Active   gabapentin (NEURONTIN) 300 MG capsule [id number] Yes TAKE 2 CAPSULES BY MOUTH THREE TIMES DAILY Marianna Payment, MD Taking Active Self  metFORMIN (GLUCOPHAGE) 500 MG tablet [id number] Yes TAKE 1/2 (ONE-HALF) TABLET BY MOUTH WITH Loa Socks, MD Taking Active Other  OXYGEN [id number] Yes Inhale 4 L into the lungs continuous. [provider] Taking Active Self  pantoprazole (PROTONIX) 20 MG tablet [id number] Yes Take 1 tablet (20 mg total) by mouth 2 (two) times daily. [name], MD Taking Active Self  potassium chloride SA (KLOR-CON) 20 MEQ tablet [id number] Yes TAKE 2 TABLETS BY MOUTH THREE TIMES DAILY [name], MD Taking Active   pravastatin (PRAVACHOL) 20 MG tablet [id number] Yes Take 1 tablet (20 mg total) by mouth daily. [name], MD Taking Active   rivaroxaban (XARELTO) 20 MG TABS tablet [id number] Yes TAKE 1 TABLET BY MOUTH ONCE DAILY WITH SUPPER [name], MD Taking Active   sildenafil (REVATIO) 20 MG tablet [id number] Yes Take 1 tablet (20 mg total) by mouth 3 (three) times daily. Bensimhon, [name], MD Taking Active Self  spironolactone (ALDACTONE) 25 MG tablet [id number] Yes Take 1/2 (one-half) tablet by mouth once daily [name], MD Taking Active   torsemide (DEMADEX) 20 MG tablet [id number] Yes Take 4 tablets (80 mg total) by mouth See admin instructions. Take 4 tablets by mouth twice daily [name], MD Taking Active   treprostinil (REMODULIN) 5 MG/ML SOLN injection [id number] Yes See admin instructions. As of 08/09/2019: Add 7 ml of Remodulin to cassette and 93 ml of sterile diluent for Remodulin to cassetet for a total volume of 100 ml to make a concentration of 350,000 ng per ml.  Infuse via a CADD pump intravenously at a rate of 40 ml per 24 hours. Based on a dosing weight of 120 kg, the dose is 81 ng per kg per min.  Once opened, discard  Remodulin vial after 30 days.  Sterile Diluent for Remodulin vials are single use only. Once mixed, discard cassette after 48 hours. [provider] Taking Active Self           Med Note (Jasson Siegmann A   Thu Nov 08, 2019  3:00 PM)            Assessment:   Goals Addressed              This Visit's Progress   .  "I want to lose weight" (pt-stated)        CARE PLAN ENTRY Medicaid Managed Care (see longitudinal plan of care for additional care plan information)  Current Barriers:  . Chronic Disease Management support and education needs related to weight loss. Patient would like to lose 30 lbs. She is unsure which diet she should be maintaining. She is confused due to having a specified diet for her heart,  one to help with  blood pressure and another diet for diabetes. Patient does not go out except to medical appointments due to fear of COVID 19.  Nurse Case Manager Clinical Goal(s):  Marland Kitchen Over the next 30 days, patient will verbalize understanding of plan for loosing weight by appropriate meal planning and light exercise . Over the next 60 days, patient will attend all scheduled medical appointments: 11/23/19 for GI  Interventions:  . Inter-disciplinary care team collaboration (see longitudinal plan of care) . Advised patient to notify PCP with any new symptoms or changes to medical status . Provided education to patient re: meal planning and how to incorporate the three diets appropriately, eating fresh fruits and vegtables, limiting salt/sodium, carbohydrates and sweets, choosing lean cuts of meat . Reviewed medications with patient and discussed taking as prescribed, calling for refills . Discussed plans with patient for ongoing care management follow up and provided patient with direct contact information for care management team . Provided simple exercises that she can do while inside the home  Plan:  . Patient will work on meal planning, begin light exercise twice  weekly . RNCM will follow up with a telephone visit on 12/11/19 @ 2:30pm   Initial goal documentation        Plan: RNCM will follow up with a telephone visit on 12/11/19 @ 2:30pm  [name] RN, [place] RN Care Coordinator

## 2019-11-08 NOTE — Patient Instructions (Signed)
Visit Information  Ms. Pellerito was given information about Medicaid Managed Care team care coordination services as a part of their Healthy [facility] Medicaid benefit. [name] verbally consented to engagement with the [facility] Managed Care team.   For questions related to your Healthy [facility] health plan, please call: [phone] or visit the homepage here: [email or web address]  If you would like to schedule transportation through your Healthy [facility] plan, please call the following number at least 2 days in advance of your appointment: [phone]  Goals Addressed              This Visit's Progress   .  "I want to lose weight" (pt-stated)        CARE PLAN ENTRY Medicaid Managed Care (see longitudinal plan of care for additional care plan information)  Current Barriers:  . Chronic Disease Management support and education needs related to weight loss. Patient would like to lose 30 lbs. She is unsure which diet she should be maintaining. She is confused due to having a specified diet for her heart,  one to help with blood pressure and another diet for diabetes. Patient does not go out except to medical appointments due to fear of COVID 19.  Nurse Case Manager Clinical Goal(s):  Marland Kitchen Over the next 30 days, patient will verbalize understanding of plan for loosing weight by appropriate meal planning and light exercise . Over the next 60 days, patient will attend all scheduled medical appointments: 11/23/19 for GI  Interventions:  . Inter-disciplinary care team collaboration (see longitudinal plan of care) . Advised patient to notify PCP with any new symptoms or changes to medical status . Provided education to patient re: meal planning and how to incorporate the three diets appropriately, eating fresh fruits and vegtables, limiting salt/sodium, carbohydrates and sweets, choosing lean cuts of meat . Reviewed medications with patient and discussed  taking as prescribed, calling for refills . Discussed plans with patient for ongoing care management follow up and provided patient with direct contact information for care management team . Provided simple exercises that she can do while inside the home  Plan:  . Patient will work on meal planning, begin light exercise twice weekly . RNCM will follow up with a telephone visit on 12/11/19 @ 2:30pm   Initial goal documentation        Please see education materials related to diet provided as print materials.     DASH Eating Plan DASH stands for "Dietary Approaches to Stop Hypertension." The DASH eating plan is a healthy eating plan that has been shown to reduce high blood pressure (hypertension). It may also reduce your risk for type 2 diabetes, heart disease, and stroke. The DASH eating plan may also help with weight loss. What are tips for following this plan?  General guidelines  Avoid eating more than 2,300 mg (milligrams) of salt (sodium) a day. If you have hypertension, you may need to reduce your sodium intake to 1,500 mg a day.  Limit alcohol intake to no more than 1 drink a day for nonpregnant women and 2 drinks a day for men. One drink equals 12 oz of beer, 5 oz of wine, or 1 oz of hard liquor.  Work with your health care provider to maintain a healthy body weight or to lose weight. Ask what an ideal weight is for you.  Get at least 30 minutes of exercise that causes your heart to beat faster (aerobic exercise) most days of the week. Activities may  include walking, swimming, or biking.  Work with your health care provider or diet and nutrition specialist (dietitian) to adjust your eating plan to your individual calorie needs. Reading food labels   Check food labels for the amount of sodium per serving. Choose foods with less than 5 percent of the Daily Value of sodium. Generally, foods with less than 300 mg of sodium per serving fit into this eating plan.  To find whole  grains, look for the word "whole" as the first word in the ingredient list. Shopping  Buy products labeled as "low-sodium" or "no salt added."  Buy fresh foods. Avoid canned foods and premade or frozen meals. Cooking  Avoid adding salt when cooking. Use salt-free seasonings or herbs instead of table salt or sea salt. Check with your health care provider or pharmacist before using salt substitutes.  Do not fry foods. Cook foods using healthy methods such as baking, boiling, grilling, and broiling instead.  Cook with heart-healthy oils, such as olive, canola, soybean, or sunflower oil. Meal planning  Eat a balanced diet that includes: ? 5 or more servings of fruits and vegetables each day. At each meal, try to fill half of your plate with fruits and vegetables. ? Up to 6-8 servings of whole grains each day. ? Less than 6 oz of lean meat, poultry, or fish each day. A 3-oz serving of meat is about the same size as a deck of cards. One egg equals 1 oz. ? 2 servings of low-fat dairy each day. ? A serving of nuts, seeds, or beans 5 times each week. ? Heart-healthy fats. Healthy fats called Omega-3 fatty acids are found in foods such as flaxseeds and coldwater fish, like sardines, salmon, and mackerel.  Limit how much you eat of the following: ? Canned or prepackaged foods. ? Food that is high in trans fat, such as fried foods. ? Food that is high in saturated fat, such as fatty meat. ? Sweets, desserts, sugary drinks, and other foods with added sugar. ? Full-fat dairy products.  Do not salt foods before eating.  Try to eat at least 2 vegetarian meals each week.  Eat more home-cooked food and less restaurant, buffet, and fast food.  When eating at a restaurant, ask that your food be prepared with less salt or no salt, if possible. What foods are recommended? The items listed may not be a complete list. Talk with your dietitian about what dietary choices are best for  you. Grains Whole-grain or whole-wheat bread. Whole-grain or whole-wheat pasta. Brown rice. [place] Morrow. Bulgur. Whole-grain and low-sodium cereals. Pita bread. Low-fat, low-sodium crackers. Whole-wheat flour tortillas. Vegetables Fresh or frozen vegetables (raw, steamed, roasted, or grilled). Low-sodium or reduced-sodium tomato and vegetable juice. Low-sodium or reduced-sodium tomato sauce and tomato paste. Low-sodium or reduced-sodium canned vegetables. Fruits All fresh, dried, or frozen fruit. Canned fruit in natural juice (without added sugar). Meat and other protein foods Skinless chicken or [place]. Ground chicken or [place]. Pork with fat trimmed off. Fish and seafood. Egg whites. Dried beans, peas, or lentils. Unsalted nuts, nut butters, and seeds. Unsalted canned beans. Lean cuts of beef with fat trimmed off. Low-sodium, lean deli meat. Dairy Low-fat (1%) or fat-free (skim) milk. Fat-free, low-fat, or reduced-fat cheeses. Nonfat, low-sodium ricotta or cottage cheese. Low-fat or nonfat yogurt. Low-fat, low-sodium cheese. Fats and oils Soft margarine without trans fats. Vegetable oil. Low-fat, reduced-fat, or light mayonnaise and salad dressings (reduced-sodium). Canola, safflower, olive, soybean, and sunflower oils. Avocado. Seasoning and  other foods Herbs. Spices. Seasoning mixes without salt. Unsalted popcorn and pretzels. Fat-free sweets. What foods are not recommended? The items listed may not be a complete list. Talk with your dietitian about what dietary choices are best for you. Grains Baked goods made with fat, such as croissants, muffins, or some breads. Dry pasta or rice meal packs. Vegetables Creamed or fried vegetables. Vegetables in a cheese sauce. Regular canned vegetables (not low-sodium or reduced-sodium). Regular canned tomato sauce and paste (not low-sodium or reduced-sodium). Regular tomato and vegetable juice (not low-sodium or reduced-sodium). Angie Fava.  Olives. Fruits Canned fruit in a light or heavy syrup. Fried fruit. Fruit in cream or butter sauce. Meat and other protein foods Fatty cuts of meat. Ribs. Fried meat. Berniece Salines. Sausage. Bologna and other processed lunch meats. Salami. Fatback. Hotdogs. Bratwurst. Salted nuts and seeds. Canned beans with added salt. Canned or smoked fish. Whole eggs or egg yolks. Chicken or [place] with skin. Dairy Whole or 2% milk, cream, and half-and-half. Whole or full-fat cream cheese. Whole-fat or sweetened yogurt. Full-fat cheese. Nondairy creamers. Whipped toppings. Processed cheese and cheese spreads. Fats and oils Butter. Stick margarine. Lard. Shortening. Ghee. Bacon fat. Tropical oils, such as coconut, palm kernel, or palm oil. Seasoning and other foods Salted popcorn and pretzels. Onion salt, garlic salt, seasoned salt, table salt, and sea salt. Worcestershire sauce. Tartar sauce. Barbecue sauce. Teriyaki sauce. Soy sauce, including reduced-sodium. Steak sauce. Canned and packaged gravies. Fish sauce. Oyster sauce. Cocktail sauce. Horseradish that you find on the shelf. Ketchup. Mustard. Meat flavorings and tenderizers. Bouillon cubes. Hot sauce and Tabasco sauce. Premade or packaged marinades. Premade or packaged taco seasonings. Relishes. Regular salad dressings. Where to find more information:  National Heart, Lung, and Standard: https://wilson-eaton.com/  American Heart Association: www.heart.org Summary  The DASH eating plan is a healthy eating plan that has been shown to reduce high blood pressure (hypertension). It may also reduce your risk for type 2 diabetes, heart disease, and stroke.  With the DASH eating plan, you should limit salt (sodium) intake to 2,300 mg a day. If you have hypertension, you may need to reduce your sodium intake to 1,500 mg a day.  When on the DASH eating plan, aim to eat more fresh fruits and vegetables, whole grains, lean proteins, low-fat dairy, and heart-healthy  fats.   Patient verbalizes understanding of instructions provided today.   Telephone follow up appointment with Managed Medicaid care management team member scheduled for:12/11/19 @ 2:30  [name] RN, [place] RN Care Coordinator    Work with your health care provider or diet and nutrition specialist (dietitian) to adjust your eating plan to your individual calorie needs. This information is not intended to replace advice given to you by your health care provider. Make sure you discuss any questions you have with your health care provider. Document Revised: 12/03/2016 Document Reviewed: 12/15/2015 Elsevier Patient Education  2020 [employer].

## 2019-11-09 ENCOUNTER — Other Ambulatory Visit: Payer: Self-pay

## 2019-11-12 MED ORDER — METFORMIN HCL 500 MG PO TABS
ORAL_TABLET | ORAL | 0 refills | Status: DC
Start: 2019-11-12 — End: 2020-03-10

## 2019-11-16 ENCOUNTER — Other Ambulatory Visit: Payer: Self-pay

## 2019-11-16 ENCOUNTER — Emergency Department (HOSPITAL_COMMUNITY): Payer: Medicaid Other

## 2019-11-16 ENCOUNTER — Emergency Department (HOSPITAL_COMMUNITY)
Admission: EM | Admit: 2019-11-16 | Discharge: 2019-11-16 | Disposition: A | Discharge status: Home / Self Care | Payer: Medicaid Other | Attending: Emergency Medicine | Admitting: Emergency Medicine

## 2019-11-16 DIAGNOSIS — Z79899 Other long term (current) drug therapy: Secondary | ICD-10-CM | POA: Diagnosis not present

## 2019-11-16 DIAGNOSIS — Z7951 Long term (current) use of inhaled steroids: Secondary | ICD-10-CM | POA: Diagnosis not present

## 2019-11-16 DIAGNOSIS — Y831 Surgical operation with implant of artificial internal device as the cause of abnormal reaction of the patient, or of later complication, without mention of misadventure at the time of the procedure: Secondary | ICD-10-CM | POA: Insufficient documentation

## 2019-11-16 DIAGNOSIS — T82524A Displacement of infusion catheter, initial encounter: Secondary | ICD-10-CM | POA: Diagnosis not present

## 2019-11-16 DIAGNOSIS — R52 Pain, unspecified: Secondary | ICD-10-CM | POA: Diagnosis not present

## 2019-11-16 DIAGNOSIS — I5031 Acute diastolic (congestive) heart failure: Secondary | ICD-10-CM | POA: Insufficient documentation

## 2019-11-16 DIAGNOSIS — Z7984 Long term (current) use of oral hypoglycemic drugs: Secondary | ICD-10-CM | POA: Insufficient documentation

## 2019-11-16 DIAGNOSIS — I110 Hypertensive heart disease with heart failure: Secondary | ICD-10-CM | POA: Insufficient documentation

## 2019-11-16 DIAGNOSIS — R58 Hemorrhage, not elsewhere classified: Secondary | ICD-10-CM | POA: Diagnosis not present

## 2019-11-16 DIAGNOSIS — Z95828 Presence of other vascular implants and grafts: Secondary | ICD-10-CM

## 2019-11-16 DIAGNOSIS — Z886 Allergy status to analgesic agent status: Secondary | ICD-10-CM | POA: Diagnosis not present

## 2019-11-16 DIAGNOSIS — R5381 Other malaise: Secondary | ICD-10-CM | POA: Diagnosis not present

## 2019-11-16 DIAGNOSIS — Z9981 Dependence on supplemental oxygen: Secondary | ICD-10-CM | POA: Diagnosis not present

## 2019-11-16 DIAGNOSIS — Z885 Allergy status to narcotic agent status: Secondary | ICD-10-CM | POA: Diagnosis not present

## 2019-11-16 DIAGNOSIS — Z452 Encounter for adjustment and management of vascular access device: Secondary | ICD-10-CM | POA: Diagnosis not present

## 2019-11-16 DIAGNOSIS — L299 Pruritus, unspecified: Secondary | ICD-10-CM | POA: Diagnosis not present

## 2019-11-16 DIAGNOSIS — R0902 Hypoxemia: Secondary | ICD-10-CM | POA: Diagnosis not present

## 2019-11-16 DIAGNOSIS — Z888 Allergy status to other drugs, medicaments and biological substances status: Secondary | ICD-10-CM | POA: Diagnosis not present

## 2019-11-16 DIAGNOSIS — Z91040 Latex allergy status: Secondary | ICD-10-CM | POA: Diagnosis not present

## 2019-11-16 DIAGNOSIS — Z7901 Long term (current) use of anticoagulants: Secondary | ICD-10-CM | POA: Diagnosis not present

## 2019-11-16 DIAGNOSIS — Z882 Allergy status to sulfonamides status: Secondary | ICD-10-CM | POA: Insufficient documentation

## 2019-11-16 DIAGNOSIS — T82898A Other specified complication of vascular prosthetic devices, implants and grafts, initial encounter: Secondary | ICD-10-CM | POA: Diagnosis not present

## 2019-11-16 DIAGNOSIS — I272 Pulmonary hypertension, unspecified: Secondary | ICD-10-CM | POA: Insufficient documentation

## 2019-11-16 HISTORY — PX: IR VENO/JUGULAR RIGHT: IMG2275

## 2019-11-16 HISTORY — PX: IR FLUORO GUIDE CV LINE LEFT: IMG2282

## 2019-11-16 HISTORY — PX: IR US GUIDE VASC ACCESS RIGHT: IMG2390

## 2019-11-16 HISTORY — PX: IR US GUIDE VASC ACCESS LEFT: IMG2389

## 2019-11-16 MED ORDER — IOHEXOL 300 MG/ML  SOLN
50.0000 mL | Freq: Once | INTRAMUSCULAR | Status: AC | PRN
  Administered 2019-11-16: 5 mL via INTRAVENOUS

## 2019-11-16 MED ORDER — LIDOCAINE HCL 1 % IJ SOLN
INTRAMUSCULAR | Status: DC | PRN
  Administered 2019-11-16: 10 mL

## 2019-11-16 MED ORDER — HEPARIN SOD (PORK) LOCK FLUSH 100 UNIT/ML IV SOLN
INTRAVENOUS | Status: DC | PRN
  Administered 2019-11-16: 500 [IU] via INTRAVENOUS

## 2019-11-16 MED ORDER — TREPROSTINIL 100 MG/20ML IJ SOLN
81.0000 ng/kg/min | INTRAVENOUS | Status: DC
  Administered 2019-11-16: 81 ng/kg/min via INTRAVENOUS
  Filled 2019-11-16: qty 7

## 2019-11-16 MED ORDER — LIDOCAINE HCL 1 % IJ SOLN
INTRAMUSCULAR | Status: AC
  Filled 2019-11-16: qty 20

## 2019-11-16 MED ORDER — HEPARIN SOD (PORK) LOCK FLUSH 100 UNIT/ML IV SOLN
INTRAVENOUS | Status: AC
  Filled 2019-11-16: qty 5

## 2019-11-16 NOTE — Procedures (Signed)
Interventional Radiology Procedure Note  Procedure: Right jugular venogram; placement of tunneled CVC via left IJ vein  Complications: None  Estimated Blood Loss: 10 mL  Findings: After right IJ access, unable to pass guidewire centrally. 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 Unable to place right sided catheter and right IJ no longer usable for CVC's.  Left IJ SL Power Line measuring 27 cm placed with catheter tip at SVC/RA junction. OK to use.  Venetia Night. Kathlene Cote, M.D Pager:  [phone]

## 2019-11-16 NOTE — ED Provider Notes (Signed)
12:01 PM Patient awake, alert, watching [place] on TV.  Central venous access has been changed with the assistance of our interventional radiology colleagues.  This was well-tolerated, apparently, patient has no ongoing complaints.  She is appropriate for discharge with ongoing IV therapy as an outpatient.   Carmin Muskrat, MD 11/16/19 1201

## 2019-11-16 NOTE — ED Notes (Signed)
Still awaiting for remodulin from main pharmacy.

## 2019-11-16 NOTE — ED Notes (Signed)
Pt assisted with getting dressed. Home infusion IV pump is running. Pt placed in wheelchair and helped to son's car for discharge

## 2019-11-16 NOTE — ED Notes (Signed)
Pt states she takes care of her infusion pump by her self and does not use home care. Pt son will come and transport her home.

## 2019-11-16 NOTE — ED Provider Notes (Signed)
TIME SEEN: 5:30 AM  CHIEF COMPLAINT: PICC line dislodged  HPI: Patient is a 59 year old female with history of pulmonary hypertension on continuous Remodulin infusion who presents to the emergency department after her right jugular tunneled power line dislodged about 30 minutes prior to arrival when putting on her nightgown.  The entire central line has come out.  No bleeding.  She has chronic shortness of breath which is unchanged.  No pain.  No fever.  ROS: See HPI Constitutional: no fever  Eyes: no drainage  ENT: no runny nose   Cardiovascular:  no chest pain  Resp: Chronic SOB  GI: no vomiting GU: no dysuria Integumentary: no rash  Allergy: no hives  Musculoskeletal: no leg swelling  Neurological: no slurred speech ROS otherwise negative  PAST MEDICAL HISTORY/PAST SURGICAL HISTORY:  Past Medical History:  Diagnosis Date  . Anemia, iron deficiency    secondary to menhorrhagia, on oral iron, also b12 def, getting monthly b12 shots  . Cellulitis 05/2019   RIGHT LOWER EXTREMITY  . CHF (congestive heart failure) ([place])   . Chronic cough    secondary to alleriges and post nasal drip  . Cognitive impairment   . COPD (chronic obstructive pulmonary disease) ([place])   . Cor pulmonale (HCC)    PA Peak pressure 79mHg  . Depression   . Diabetes mellitus    well controlled on metformin  . Diastolic heart failure ([place]   . GERD (gastroesophageal reflux disease)   . H/O mental retardation   . Herpes   . Hyperlipidemia   . Hypertension   . OSA (obstructive sleep apnea)    CPAP  . Pulmonary hypertension ([place]   . Renal disorder   . Restrictive lung disease    PFTs 06/2012 (FVC 54% predicted and FEV1 68% predicted w minimal bronchodilator response).  . Shortness of breath   . Venous stasis ulcer ([place]    chornic, ?followed up at wound care center, multiple courses of antibiotics in past for cellulitis, on lasix    MEDICATIONS:  Prior to Admission medications   Medication Sig  Start Date End Date Taking? Authorizing Provider  ACCU-CHEK AVIVA PLUS test strip USE 1 STRIP TO CHECK GLUCOSE ONCE DAILY Patient not taking: Reported on 11/08/2019 05/25/18   WKatherine Roan MD  acetaminophen (TYLENOL) 500 MG tablet Take 1 tablet (500 mg total) by mouth every 6 (six) hours as needed. 08/21/15   SBurgess Estelle MD  cetirizine (ZYRTEC) 10 MG tablet Take 1 tablet by mouth once daily 09/18/19   [name], [name], DO  clobetasol ointment (TEMOVATE) 04.13% Apply 1 application topically as directed. Apply to vulva: qhs x 4wks & then every other night x 4wks, then 2x/week 10/15/19   PAletha Halim MD  cyanocobalamin 500 MCG tablet Take 500 mcg by mouth daily. Vitamin b12    [provider]  cycloSPORINE (RESTASIS) 0.05 % ophthalmic emulsion Place 1 drop into both eyes 2 (two) times daily as needed (dry eyes).    [provider]  diphenhydrAMINE (BENADRYL) 25 MG tablet Take 50-100 mg by mouth 2 (two) times daily as needed for itching.    [provider]  fluticasone (FLONASE) 50 MCG/ACT nasal spray Place 1 spray into both nostrils daily. 08/21/19 08/20/20  KAsencion Noble MD  gabapentin (NEURONTIN) 300 MG capsule TAKE 2 CAPSULES BY MOUTH THREE TIMES DAILY 07/23/19   CMarianna Payment MD  metFORMIN (GLUCOPHAGE) 500 MG tablet TAKE 1/2 (ONE-HALF) TABLET BY MOUTH WITH BREAKFAST 11/12/19  Oda Kilts, MD  OXYGEN Inhale 4 L into the lungs continuous.    [provider]  pantoprazole (PROTONIX) 20 MG tablet Take 1 tablet (20 mg total) by mouth 2 (two) times daily. 02/28/19   [name], MD  potassium chloride SA (KLOR-CON) 20 MEQ tablet TAKE 2 TABLETS BY MOUTH THREE TIMES DAILY 10/15/19   [name], MD  pravastatin (PRAVACHOL) 20 MG tablet Take 1 tablet (20 mg total) by mouth daily. 10/16/19   [name], MD  rivaroxaban (XARELTO) 20 MG TABS tablet TAKE 1 TABLET BY MOUTH ONCE DAILY WITH SUPPER 09/21/19   [name], MD   sildenafil (REVATIO) 20 MG tablet Take 1 tablet (20 mg total) by mouth 3 (three) times daily. 01/17/19   Bensimhon, [name], MD  spironolactone (ALDACTONE) 25 MG tablet Take 1/2 (one-half) tablet by mouth once daily 10/21/19   [name], MD  torsemide (DEMADEX) 20 MG tablet Take 4 tablets (80 mg total) by mouth See admin instructions. Take 4 tablets by mouth twice daily 08/21/19   [name], MD  treprostinil (REMODULIN) 5 MG/ML SOLN injection See admin instructions. As of 08/09/2019: Add 7 ml of Remodulin to cassette and 93 ml of sterile diluent for Remodulin to cassetet for a total volume of 100 ml to make a concentration of 350,000 ng per ml.  Infuse via a CADD pump intravenously at a rate of 40 ml per 24 hours. Based on a dosing weight of 120 kg, the dose is 81 ng per kg per min.  Once opened, discard Remodulin vial after 30 days.  Sterile Diluent for Remodulin vials are single use only. Once mixed, discard cassette after 48 hours.    [provider]  cetirizine (ZYRTEC) 10 MG tablet Take 1 tablet by mouth once daily 07/24/18   [name], MD    ALLERGIES:  Allergies  Allergen Reactions  . Aspirin Swelling    REACTION: airway swelling  . Codeine Other (See Comments)    REACTION: tingling in lips and hard breathing - had reaction at dentist - states "I can't take certain kinds of codeine" - happened maybe 10 yr ago  . Lisinopril Cough  . Sulfonamide Derivatives Swelling    REACTION: airway swelling  . Latex Rash    SOCIAL HISTORY:  Social History   Tobacco Use  . Smoking status: Never Smoker  . Smokeless tobacco: Never Used  Substance Use Topics  . Alcohol use: No    Alcohol/week: 0.0 standard drinks    FAMILY HISTORY: Family History  Problem Relation Age of Onset  . Kidney Stones Son   . Mental illness Sister   . Bipolar disorder Sister   . Mental retardation Brother   . Hyperlipidemia Mother   . Breast cancer Maternal Aunt     EXAM: BP  (!) 143/86 (BP Location: Right Arm)   Pulse 75   Temp 98 F (36.7 C) (Oral)   Resp 19   Ht _0  (1.6 m)   Wt 128 kg   LMP 02/19/2011   SpO2 95%   BMI 49.99 kg/m  CONSTITUTIONAL: Alert and oriented and responds appropriately to questions.  Morbidly obese, chronically ill-appearing HEAD: Normocephalic EYES: Conjunctivae clear, pupils appear equal, EOM appear intact ENT: normal nose; moist mucous membranes NECK: Supple, normal ROM CARD: RRR; S1 and S2 appreciated; no murmurs, no clicks, no rubs, no gallops CHEST: Small area of redness to the right anterior chest wall from previous PICC  line without warmth, drainage, bleeding, soft tissue swelling or tenderness RESP: Minimally tachypneic; breath sounds clear and equal bilaterally; no wheezes, no rhonchi, no rales, no hypoxia or respiratory distress, speaking full sentences, sats in the upper 90s on her normal 4 L nasal cannula ABD/GI: Normal bowel sounds; non-distended; soft, non-tender, no rebound, no guarding, no peritoneal signs, no hepatosplenomegaly BACK:  The back appears normal EXT: Normal ROM in all joints; no deformity noted, chronic symmetric bilateral lower extremity edema; no cyanosis SKIN: Normal color for age and race; warm; no rash on exposed skin NEURO: Moves all extremities equally PSYCH: The patient's mood and manner are appropriate.   MEDICAL DECISION MAKING: Patient here after her tunneled catheter became dislodged.  It appears it was placed August 8 by Dr. Anselm Pancoast with IR.  I have called the IR PA line and left a message and I have ordered the fluoro guided line placement.  Patient last took Xarelto around 7 PM last night.  Currently n.p.o.  Peripheral line has been placed and patient's Remodulin has been ordered per pharmacy and restarted.  No new complaints at this time.  ED PROGRESS: Signed out to Dr. Vanita Panda.  Anticipate discharge home after tunneled catheter replaced.  I reviewed all nursing notes and pertinent  previous records as available.  I have reviewed and interpreted any EKGs, lab and urine results, imaging (as available).     MAKINZEE DURLEY was evaluated in Emergency Department on 11/16/2019 for the symptoms described in the history of present illness. She was evaluated in the context of the global COVID-19 pandemic, which necessitated consideration that the patient might be at risk for infection with the SARS-CoV-2 virus that causes COVID-19. Institutional protocols and algorithms that pertain to the evaluation of patients at risk for COVID-19 are in a state of rapid change based on information released by regulatory bodies including the CDC and federal and state organizations. These policies and algorithms were followed during the patient's care in the ED.      Zamora Colton, Delice Bison, DO 11/16/19 [phone]

## 2019-11-16 NOTE — H&P (Signed)
Patient Status: [facility] - ED  Assessment and Plan:  59 y/o F with history of pulmonary HTN on continuous Remodulin infusion who presented to the ED early this morning after tunneled central venous catheter became dislodged. IR has been asked to replace the catheter today.  Risks and benefits discussed with the patient including, but not limited to bleeding, infection, vascular injury, pneumothorax which may require chest tube placement, air embolism or even death.  All of the patient's questions were answered, patient is agreeable to proceed.  Consent signed and in chart.  ______________________________________________________________________   History of Present Illness: [name] is a 59 y.o. female with history of pulmonary HTN, CHF, a.flutter, HLD, DM, OSA who presented to the ED today after her right IJ tunneled central venous catheter was dislodged. [name] requires continuous Remodulin infusions at home via tunneled right IJ central venous catheter originally placed 08/12/19 by Dr. Anselm Pancoast. She reports that she was removing her night gown and had her oxygen on and somehow the central line became entangled with her nightgown and oxygen tubing and was inadvertently removed. She denies any pain or significant bleeding. She agrees to have the line replaced today.  Allergies and medications reviewed.   Review of Systems: A 12 point ROS discussed and pertinent positives are indicated in the HPI above.  All other systems are negative.  Review of Systems  Constitutional: Negative for chills and fever.  Respiratory: Positive for shortness of breath (chronic).   Cardiovascular: Negative for chest pain.  Gastrointestinal: Negative for abdominal pain.    Vital Signs: BP (!) 117/97   Pulse 77   Temp 98.6 F (37 C) (Oral)   Resp (!) 21   Ht 5' 3" (1.6 m)   Wt 282 lb 3 oz (128 kg)   LMP 02/19/2011   SpO2 94%   BMI 49.99 kg/m   Physical Exam Vitals and nursing note reviewed.    Constitutional:      General: She is not in acute distress. HENT:     Head: Normocephalic.  Cardiovascular:     Rate and Rhythm: Normal rate.     Comments: (+) right chest tract from previous line without bleeding, edema, erythema or drainage Pulmonary:     Effort: Pulmonary effort is normal.     Comments: Supplemental oxygen via [place] Skin:    General: Skin is warm and dry.  Neurological:     Mental Status: She is alert and oriented to person, place, and time.      Imaging reviewed.   Labs:  COAGS: Recent Labs    05/02/19 1953  INR 1.3*    BMP: Recent Labs    05/24/19 0749 05/24/19 0749 07/02/19 1247 08/11/19 2120 08/12/19 0405 08/21/19 1227  NA 136   < > 138 139 139 139  K 4.3   < > 3.2* 3.9 3.2* 3.8  CL 93*   < > 97 101 102 99  CO2 33*   < > 35* _0 GLUCOSE 123*   < > 104* 88 86 114*  BUN 41*   < > _1 CALCIUM 8.7*   < > 8.9 8.6* 8.7* 8.9  CREATININE 0.97   < > 0.70 0.69 0.59 0.77  GFRNONAA >60  --   --  >60 >60 >60  GFRAA >60  --   --  >60 >60 >60   < > = values in this interval not displayed.  Electronically Signed: Joaquim Nam, PA-C 11/16/2019, 10:12 AM   I spent a total of 15 minutes in face to face in clinical consultation, greater than 50% of which was counseling/coordinating care for venous access.

## 2019-11-16 NOTE — ED Triage Notes (Signed)
Brought in by PTAR from home, c/o PICC line coming out after changing into night gown.

## 2019-11-16 NOTE — ED Notes (Signed)
ED Provider at bedside. 

## 2019-11-16 NOTE — ED Notes (Addendum)
Pt returned from IR.

## 2019-11-16 NOTE — ED Notes (Signed)
Pt transport to IR for line placement

## 2019-11-16 NOTE — Discharge Instructions (Addendum)
Your venous access has been changed.  Follow-up with your physician or return here for concerning changes.

## 2019-11-16 NOTE — ED Notes (Signed)
Fr.5 single lumen PICC line noted at bedside.

## 2019-11-19 ENCOUNTER — Telehealth: Payer: Self-pay

## 2019-11-19 NOTE — Telephone Encounter (Signed)
Transition Care Management Unsuccessful Follow-up Telephone Call  Date of discharge and from where:  11/16/19 [facility]   Attempts:  1st Attempt  Reason for unsuccessful TCM follow-up call:  Left voice message

## 2019-11-20 NOTE — Telephone Encounter (Signed)
Transition Care Management Unsuccessful Follow-up Telephone Call  Date of discharge and from where: 11/16/19 [name]   Attempts:  2nd Attempt  Reason for unsuccessful TCM follow-up call:  Left voice message

## 2019-11-21 NOTE — Telephone Encounter (Signed)
Transition Care Management Unsuccessful Follow-up Telephone Call  Date of discharge and from where:  11/16/19 [facility]   Attempts:  3rd Attempt  Reason for unsuccessful TCM follow-up call:  Left voice message

## 2019-11-23 ENCOUNTER — Telehealth: Payer: Self-pay

## 2019-11-23 ENCOUNTER — Encounter: Payer: Self-pay | Admitting: Gastroenterology

## 2019-11-23 ENCOUNTER — Ambulatory Visit (INDEPENDENT_AMBULATORY_CARE_PROVIDER_SITE_OTHER): Payer: Medicaid Other | Admitting: Gastroenterology

## 2019-11-23 VITALS — BP 118/70 | HR 83 | Ht 63.0 in | Wt 285.0 lb

## 2019-11-23 DIAGNOSIS — K625 Hemorrhage of anus and rectum: Secondary | ICD-10-CM | POA: Diagnosis not present

## 2019-11-23 MED ORDER — SUPREP BOWEL PREP KIT 17.5-3.13-1.6 GM/177ML PO SOLN: 1.0000 | ORAL | 0 refills | Status: DC

## 2019-11-23 NOTE — Progress Notes (Signed)
Review of pertinent gastrointestinal problems: 1.  Routine risk for colon cancer.  Colonoscopy November 2013 for routine risk screening was normal.  She was recommended to have repeat colonoscopy at 11 years for colon cancer screening   HPI: This is a very pleasant, cognitively challenged 59 year old woman whom I last saw many years ago.   She is seen in the heart failure clinic.  She has severe pulmonary hypertension, paroxysmal A. fib a flutter, diastolic heart failure.  Severe obesity.  [place] heart association class III symptoms chronically.  She is on torsemide 80 mg a day spironolactone 12.5 mg/day, fluid restriction.  She has severe pulmonary hypertension with cor pulmonale.  She is followed at the [facility] pulmonary hypertensive clinic.  She is on Remodulin and sildenafil.  She is on a blood thinner Xarelto chronically for her paroxysmal A. fib.  She is in chronic respiratory failure and on oxygen chronically.  Blood work August 2021 showed normal CBC  She has been having bleeding, about every few days.  She is not sure if this is vaginal bleeding or rectal bleeding.  She does sometimes see blood mixed with stool when she moves her bowels.  She has been seeing a gynecologist about potential vaginal bleeding.  Colon cancer does not run in her family  She gets around with a walker and a motorized chair.   Review of systems: Pertinent positive and negative review of systems were noted in the above HPI section. All other review negative.   Past Medical History:  Diagnosis Date  . Anemia, iron deficiency    secondary to menhorrhagia, on oral iron, also b12 def, getting monthly b12 shots  . Cellulitis 05/2019   RIGHT LOWER EXTREMITY  . CHF (congestive heart failure) ([place])   . Chronic cough    secondary to alleriges and post nasal drip  . Cognitive impairment   . COPD (chronic obstructive pulmonary disease) ([place])   . Cor pulmonale (HCC)    PA Peak pressure 44mHg  . Depression   .  Diabetes mellitus    well controlled on metformin  . Diastolic heart failure ([place]   . GERD (gastroesophageal reflux disease)   . H/O mental retardation   . Herpes   . Hyperlipidemia   . Hypertension   . OSA (obstructive sleep apnea)    CPAP  . Pulmonary hypertension ([place]   . Renal disorder   . Restrictive lung disease    PFTs 06/2012 (FVC 54% predicted and FEV1 68% predicted w minimal bronchodilator response).  . Shortness of breath   . Venous stasis ulcer ([place]    chornic, ?followed up at wound care center, multiple courses of antibiotics in past for cellulitis, on lasix    Past Surgical History:  Procedure Laterality Date  . CARDIAC CATHETERIZATION N/A 01/13/2016   Procedure: Right Heart Cath;  Surgeon: DJolaine Artist MD;  [facility];  Service: Cardiovascular;  Laterality: N/A;  . CHOLECYSTECTOMY    . IR FLUORO GUIDE CV LINE LEFT  11/16/2019  . IR FLUORO GUIDE CV LINE RIGHT  05/09/2019  . IR FLUORO GUIDE CV LINE RIGHT  08/12/2019  . IR REMOVAL TUN CV CATH W/O FL  05/05/2019  . IR [place] ACCESS LEFT  11/16/2019  . [place] V[place] ACCESS RIGHT  05/09/2019  . IR [place] V[place] ACCESS RIGHT  08/12/2019  . IR [place] VASC ACCESS RIGHT  11/16/2019  . IR VENO/JUGULAR RIGHT  11/16/2019  . LEFT  AND RIGHT HEART CATHETERIZATION WITH CORONARY ANGIOGRAM N/A 02/28/2014   Procedure: LEFT AND RIGHT HEART CATHETERIZATION WITH CORONARY ANGIOGRAM;  Surgeon: Jolaine Artist, MD;  Location: [facility] C[facility];  Service: Cardiovascular;  Laterality: N/A;  . RIGHT HEART CATH N/A 06/27/2018   Procedure: RIGHT HEART CATH;  Surgeon: Jolaine Artist, MD;  Location: [place] CV LAB;  Service: Cardiovascular;  Laterality: N/A;  . RIGHT/LEFT HEART CATH AND CORONARY ANGIOGRAPHY N/A 05/14/2019   Procedure: RIGHT/LEFT HEART CATH AND CORONARY ANGIOGRAPHY;  Surgeon: Jolaine Artist, MD;  Location: [place] CV LAB;  Service: Cardiovascular;  Laterality: N/A;    Current Outpatient  Medications  Medication Sig Dispense Refill  . ACCU-CHEK AVIVA PLUS test strip USE 1 STRIP TO CHECK GLUCOSE ONCE DAILY 50 each 3  . acetaminophen (TYLENOL) 500 MG tablet Take 1 tablet (500 mg total) by mouth every 6 (six) hours as needed. 30 tablet 0  . cetirizine (ZYRTEC) 10 MG tablet Take 1 tablet by mouth once daily 90 tablet 0  . clobetasol ointment (TEMOVATE) 0.97 % Apply 1 application topically as directed. Apply to vulva: qhs x 4wks & then every other night x 4wks, then 2x/week 60 g 2  . cyanocobalamin 500 MCG tablet Take 500 mcg by mouth daily. Vitamin b12    . cycloSPORINE (RESTASIS) 0.05 % ophthalmic emulsion Place 1 drop into both eyes 2 (two) times daily as needed (dry eyes).    . diphenhydrAMINE (BENADRYL) 25 MG tablet Take 50-100 mg by mouth 2 (two) times daily as needed for itching.    . fluticasone (FLONASE) 50 MCG/ACT nasal spray Place 1 spray into both nostrils daily. 16 g 2  . gabapentin (NEURONTIN) 300 MG capsule TAKE 2 CAPSULES BY MOUTH THREE TIMES DAILY 540 capsule 0  . metFORMIN (GLUCOPHAGE) 500 MG tablet TAKE 1/2 (ONE-HALF) TABLET BY MOUTH WITH BREAKFAST 45 tablet 0  . OXYGEN Inhale 4 L into the lungs continuous.    . pantoprazole (PROTONIX) 20 MG tablet Take 1 tablet (20 mg total) by mouth 2 (two) times daily. 180 tablet 3  . potassium chloride SA (KLOR-CON) 20 MEQ tablet TAKE 2 TABLETS BY MOUTH THREE TIMES DAILY 180 tablet 0  . pravastatin (PRAVACHOL) 20 MG tablet Take 1 tablet (20 mg total) by mouth daily. 90 tablet 1  . rivaroxaban (XARELTO) 20 MG TABS tablet TAKE 1 TABLET BY MOUTH ONCE DAILY WITH SUPPER 30 tablet 4  . sildenafil (REVATIO) 20 MG tablet Take 1 tablet (20 mg total) by mouth 3 (three) times daily. 270 tablet 3  . spironolactone (ALDACTONE) 25 MG tablet Take 1/2 (one-half) tablet by mouth once daily 45 tablet 0  . torsemide (DEMADEX) 20 MG tablet Take 4 tablets (80 mg total) by mouth See admin instructions. Take 4 tablets by mouth twice daily 240 tablet 0   . treprostinil (REMODULIN) 5 MG/ML SOLN injection See admin instructions. As of 08/09/2019: Add 7 ml of Remodulin to cassette and 93 ml of sterile diluent for Remodulin to cassetet for a total volume of 100 ml to make a concentration of 350,000 ng per ml.  Infuse via a CADD pump intravenously at a rate of 40 ml per 24 hours. Based on a dosing weight of 120 kg, the dose is 81 ng per kg per min.  Once opened, discard Remodulin vial after 30 days.  Sterile Diluent for Remodulin vials are single use only. Once mixed, discard cassette after 48 hours.     No current facility-administered medications for  this visit.    Allergies as of 11/23/2019 - Review Complete 11/23/2019  Allergen Reaction Noted  . Aspirin Swelling 12/09/2005  . Codeine Other (See Comments)   . Lisinopril Cough 05/02/2012  . Sulfonamide derivatives Swelling 10/19/2005  . Latex Rash 01/07/2016    Family History  Problem Relation Age of Onset  . Kidney Stones Son   . Mental illness Sister   . Bipolar disorder Sister   . Mental retardation Brother   . Hyperlipidemia Mother   . Breast cancer Maternal Aunt   . Colon cancer Neg Hx   . Pancreatic cancer Neg Hx   . Esophageal cancer Neg Hx     Social History   Socioeconomic History  . Marital status: Single    Spouse name: Not on file  . Number of children: Not on file  . Years of education: Not on file  . Highest education level: Not on file  Occupational History  . Not on file  Tobacco Use  . Smoking status: Never Smoker  . Smokeless tobacco: Never Used  Vaping Use  . Vaping Use: Never used  Substance and Sexual Activity  . Alcohol use: No    Alcohol/week: 0.0 standard drinks  . Drug use: No  . Sexual activity: Not Currently  Other Topics Concern  . Not on file  Social History Narrative  . Not on file   Social Determinants of Health   Financial Resource Strain:   . Difficulty of Paying Living Expenses: Not on file  Food Insecurity: No Food Insecurity  .  Worried About Running Out of Food in the Last Year: Never true  . Ran Out of Food in the Last Year: Never true  Transportation Needs: No Transportation Needs  . Lack of Transportation (Medical): No  . Lack of Transportation (Non-Medical): No  Physical Activity:   . Days of Exercise per Week: Not on file  . Minutes of Exercise per Session: Not on file  Stress:   . Feeling of Stress : Not on file  Social Connections: Moderately Isolated  . Frequency of Communication with Friends and Family: Once a week  . Frequency of Social Gatherings with Friends and Family: More than three times a week  . Attends Religious Services: 1 to 4 times per year  . Active Member of Clubs or Organizations: No  . Attends Club or Organization Meetings: Never  . Marital Status: Never married  Intimate Partner Violence:   . Fear of Current or Ex-Partner: Not on file  . Emotionally Abused: Not on file  . Physically Abused: Not on file  . Sexually Abused: Not on file     Physical Exam: Ht 5' 3" (1.6 m)   Wt 285 lb (129.3 kg)   LMP 02/19/2011   BMI 50.49 kg/m  Constitutional: Chronically ill-appearing, wearing oxygen nasal cannula, morbidly obese Psychiatric: alert and oriented x3 Eyes: extraocular movements intact Mouth: oral pharynx moist, no lesions Neck: supple no lymphadenopathy Cardiovascular: heart regular rate and rhythm Lungs: clear to auscultation bilaterally Abdomen: soft, nontender, nondistended, no obvious ascites, no peritoneal signs, normal bowel sounds Extremities: no lower extremity edema bilaterally Skin: no lesions on visible extremities   Assessment and plan: 59 y.o. female rectal bleeding, possibly also vaginal bleeding, blood thinner use, morbid obesity, pulmonary artery hypertension  She is having some pretty clear rectal bleeding and may also be having some vaginal bleeding that she is mistaking for rectal bleeding as well.  Her last colonoscopy was about 8 years ago.    She is at  elevated risk for procedure related complications given her multiple comorbid conditions and the fact that she is on a blood thinner routinely.  I recommended a colonoscopy but it would definitely be safest if that is done at the hospital setting for her.  We will recheck her cardiologist about her holding her Xarelto for 2 days prior to a colonoscopy.  I see no reason for any further blood tests or imaging studies prior to then.   Please see the "Patient Instructions" section for addition details about the plan.   Owens Loffler, MD [facility] 11/23/2019, 2:48 PM  Cc: [name], MD Total time on date of encounter was 45  minutes (this included time spent preparing to see the patient reviewing records; obtaining and/or reviewing separately obtained history; performing a medically appropriate exam and/or evaluation; counseling and educating the patient and family if present; ordering medications, tests or procedures if applicable; and documenting clinical information in the health record).

## 2019-11-23 NOTE — H&P (View-Only) (Signed)
Review of pertinent gastrointestinal problems: 1.  Routine risk for colon cancer.  Colonoscopy November 2013 for routine risk screening was normal.  She was recommended to have repeat colonoscopy at 11 years for colon cancer screening   HPI: This is a very pleasant, cognitively challenged 59 year old woman whom I last saw many years ago.   She is seen in the heart failure clinic.  She has severe pulmonary hypertension, paroxysmal A. fib a flutter, diastolic heart failure.  Severe obesity.  [place] heart association class III symptoms chronically.  She is on torsemide 80 mg a day spironolactone 12.5 mg/day, fluid restriction.  She has severe pulmonary hypertension with cor pulmonale.  She is followed at the [facility] pulmonary hypertensive clinic.  She is on Remodulin and sildenafil.  She is on a blood thinner Xarelto chronically for her paroxysmal A. fib.  She is in chronic respiratory failure and on oxygen chronically.  Blood work August 2021 showed normal CBC  She has been having bleeding, about every few days.  She is not sure if this is vaginal bleeding or rectal bleeding.  She does sometimes see blood mixed with stool when she moves her bowels.  She has been seeing a gynecologist about potential vaginal bleeding.  Colon cancer does not run in her family  She gets around with a walker and a motorized chair.   Review of systems: Pertinent positive and negative review of systems were noted in the above HPI section. All other review negative.   Past Medical History:  Diagnosis Date  . Anemia, iron deficiency    secondary to menhorrhagia, on oral iron, also b12 def, getting monthly b12 shots  . Cellulitis 05/2019   RIGHT LOWER EXTREMITY  . CHF (congestive heart failure) ([place])   . Chronic cough    secondary to alleriges and post nasal drip  . Cognitive impairment   . COPD (chronic obstructive pulmonary disease) ([place])   . Cor pulmonale (HCC)    [place] Peak pressure 44mHg  . Depression   .  Diabetes mellitus    well controlled on metformin  . Diastolic heart failure ([place]   . GERD (gastroesophageal reflux disease)   . H/O mental retardation   . Herpes   . Hyperlipidemia   . Hypertension   . OSA (obstructive sleep apnea)    CPAP  . [place]monary hypertension ([place]   . Renal disorder   . Restrictive lung disease    PFTs 06/2012 (FVC 54% predicted and FEV1 68% predicted w minimal bronchodilator response).  . Shortness of breath   . Venous stasis ulcer ([place]    chornic, ?followed up at wound care center, multiple courses of antibiotics in past for cellulitis, on lasix    Past Surgical History:  Procedure Laterality Date  . CARDIAC CATHETERIZATION N/A 01/13/2016   Procedure: Right Heart Cath;  Surgeon: DJolaine Artist MD;  [facility];  Service: Cardiovascular;  Laterality: N/A;  . CHOLECYSTECTOMY    . IR FLUORO GUIDE CV LINE LEFT  11/16/2019  . IR FLUORO GUIDE CV LINE RIGHT  05/09/2019  . IR FLUORO GUIDE CV LINE RIGHT  08/12/2019  . IR REMOVAL TUN CV CATH W/O FL  05/05/2019  . IR [place] VASC ACCESS LEFT  11/16/2019  . IR [place] VASC ACCESS RIGHT  05/09/2019  . IR [place] VASC ACCESS RIGHT  08/12/2019  . IR [place] VASC ACCESS RIGHT  11/16/2019  . IR VENO/JUGULAR RIGHT  11/16/2019  . LEFT  AND RIGHT HEART CATHETERIZATION WITH CORONARY ANGIOGRAM N/A 02/28/2014   Procedure: LEFT AND RIGHT HEART CATHETERIZATION WITH CORONARY ANGIOGRAM;  Surgeon: Jolaine Artist, MD;  Location: [facility] CATH LAB;  Service: Cardiovascular;  Laterality: N/A;  . RIGHT HEART CATH N/A 06/27/2018   Procedure: RIGHT HEART CATH;  Surgeon: Jolaine Artist, MD;  Location: [place] CV LAB;  Service: Cardiovascular;  Laterality: N/A;  . RIGHT/LEFT HEART CATH AND CORONARY ANGIOGRAPHY N/A 05/14/2019   Procedure: RIGHT/LEFT HEART CATH AND CORONARY ANGIOGRAPHY;  Surgeon: Jolaine Artist, MD;  Location: [place] [facility] LAB;  Service: Cardiovascular;  Laterality: N/A;    Current Outpatient  Medications  Medication Sig Dispense Refill  . ACCU-CHEK AVIVA PLUS test strip USE 1 STRIP TO CHECK GLUCOSE ONCE DAILY 50 each 3  . acetaminophen (TYLENOL) 500 MG tablet Take 1 tablet (500 mg total) by mouth every 6 (six) hours as needed. 30 tablet 0  . cetirizine (ZYRTEC) 10 MG tablet Take 1 tablet by mouth once daily 90 tablet 0  . clobetasol ointment (TEMOVATE) 3.53 % Apply 1 application topically as directed. Apply to vulva: qhs x 4wks & then every other night x 4wks, then 2x/week 60 g 2  . cyanocobalamin 500 MCG tablet Take 500 mcg by mouth daily. Vitamin b12    . cycloSPORINE (RESTASIS) 0.05 % ophthalmic emulsion Place 1 drop into both eyes 2 (two) times daily as needed (dry eyes).    . diphenhydrAMINE (BENADRYL) 25 MG tablet Take 50-100 mg by mouth 2 (two) times daily as needed for itching.    . fluticasone (FLONASE) 50 MCG/ACT nasal spray Place 1 spray into both nostrils daily. 16 g 2  . gabapentin (NEURONTIN) 300 MG capsule TAKE 2 CAPSULES BY MOUTH THREE TIMES DAILY 540 capsule 0  . metFORMIN (GLUCOPHAGE) 500 MG tablet TAKE 1/2 (ONE-HALF) TABLET BY MOUTH WITH BREAKFAST 45 tablet 0  . OXYGEN Inhale 4 L into the lungs continuous.    . pantoprazole (PROTONIX) 20 MG tablet Take 1 tablet (20 mg total) by mouth 2 (two) times daily. 180 tablet 3  . potassium chloride SA (KLOR-CON) 20 MEQ tablet TAKE 2 TABLETS BY MOUTH THREE TIMES DAILY 180 tablet 0  . pravastatin (PRAVACHOL) 20 MG tablet Take 1 tablet (20 mg total) by mouth daily. 90 tablet 1  . rivaroxaban (XARELTO) 20 MG TABS tablet TAKE 1 TABLET BY MOUTH ONCE DAILY WITH SUPPER 30 tablet 4  . sildenafil (REVATIO) 20 MG tablet Take 1 tablet (20 mg total) by mouth 3 (three) times daily. 270 tablet 3  . spironolactone (ALDACTONE) 25 MG tablet Take 1/2 (one-half) tablet by mouth once daily 45 tablet 0  . torsemide (DEMADEX) 20 MG tablet Take 4 tablets (80 mg total) by mouth See admin instructions. Take 4 tablets by mouth twice daily 240 tablet 0   . treprostinil (REMODULIN) 5 MG/ML SOLN injection See admin instructions. As of 08/09/2019: Add 7 ml of Remodulin to cassette and 93 ml of sterile diluent for Remodulin to cassetet for a total volume of 100 ml to make a concentration of 350,000 ng per ml.  Infuse via a CADD pump intravenously at a rate of 40 ml per 24 hours. Based on a dosing weight of 120 kg, the dose is 81 ng per kg per min.  Once opened, discard Remodulin vial after 30 days.  Sterile Diluent for Remodulin vials are single use only. Once mixed, discard cassette after 48 hours.     No current facility-administered medications for  this visit.    Allergies as of 11/23/2019 - Review Complete 11/23/2019  Allergen Reaction Noted  . Aspirin Swelling 12/09/2005  . Codeine Other (See Comments)   . Lisinopril Cough 05/02/2012  . Sulfonamide derivatives Swelling 10/19/2005  . Latex Rash 01/07/2016    Family History  Problem Relation Age of Onset  . Kidney Stones Son   . Mental illness Sister   . Bipolar disorder Sister   . Mental retardation Brother   . Hyperlipidemia Mother   . Breast cancer Maternal Aunt   . Colon cancer Neg Hx   . Pancreatic cancer Neg Hx   . Esophageal cancer Neg Hx     Social History   Socioeconomic History  . Marital status: Single    Spouse name: Not on file  . Number of children: Not on file  . Years of education: Not on file  . Highest education level: Not on file  Occupational History  . Not on file  Tobacco Use  . Smoking status: Never Smoker  . Smokeless tobacco: Never Used  Vaping Use  . Vaping Use: Never used  Substance and Sexual Activity  . Alcohol use: No    Alcohol/week: 0.0 standard drinks  . Drug use: No  . Sexual activity: Not Currently  Other Topics Concern  . Not on file  Social History Narrative  . Not on file   Social Determinants of Health   Financial Resource Strain:   . Difficulty of Paying Living Expenses: Not on file  Food Insecurity: No Food Insecurity  .  Worried About Charity fundraiser in the Last Year: Never true  . Ran Out of Food in the Last Year: Never true  Transportation Needs: No Transportation Needs  . Lack of Transportation (Medical): No  . Lack of Transportation (Non-Medical): No  Physical Activity:   . Days of Exercise per Week: Not on file  . Minutes of Exercise per Session: Not on file  Stress:   . Feeling of Stress : Not on file  Social Connections: Moderately Isolated  . Frequency of Communication with Friends and Family: Once a week  . Frequency of Social Gatherings with Friends and Family: More than three times a week  . Attends Religious Services: 1 to 4 times per year  . Active Member of Clubs or Organizations: No  . Attends Archivist Meetings: Never  . Marital Status: Never married  Intimate Partner Violence:   . Fear of Current or Ex-Partner: Not on file  . Emotionally Abused: Not on file  . Physically Abused: Not on file  . Sexually Abused: Not on file     Physical Exam: Ht _0  (1.6 m)   Wt 285 lb (129.3 kg)   LMP 02/19/2011   BMI 50.49 kg/m  Constitutional: Chronically ill-appearing, wearing oxygen nasal cannula, morbidly obese Psychiatric: alert and oriented x3 Eyes: extraocular movements intact Mouth: oral pharynx moist, no lesions Neck: supple no lymphadenopathy Cardiovascular: heart regular rate and rhythm Lungs: clear to auscultation bilaterally Abdomen: soft, nontender, nondistended, no obvious ascites, no peritoneal signs, normal bowel sounds Extremities: no lower extremity edema bilaterally Skin: no lesions on visible extremities   Assessment and plan: 59 y.o. female rectal bleeding, possibly also vaginal bleeding, blood thinner use, morbid obesity, pulmonary artery hypertension  She is having some pretty clear rectal bleeding and may also be having some vaginal bleeding that she is mistaking for rectal bleeding as well.  Her last colonoscopy was about 8 years ago.  She is at  elevated risk for procedure related complications given her multiple comorbid conditions and the fact that she is on a blood thinner routinely.  I recommended a colonoscopy but it would definitely be safest if that is done at the hospital setting for her.  We will recheck her cardiologist about her holding her Xarelto for 2 days prior to a colonoscopy.  I see no reason for any further blood tests or imaging studies prior to then.   Please see the "Patient Instructions" section for addition details about the plan.   Owens Loffler, MD [facility] 11/23/2019, 2:48 PM  Cc: [name], MD Total time on date of encounter was 45  minutes (this included time spent preparing to see the patient reviewing records; obtaining and/or reviewing separately obtained history; performing a medically appropriate exam and/or evaluation; counseling and educating the patient and family if present; ordering medications, tests or procedures if applicable; and documenting clinical information in the health record).

## 2019-11-23 NOTE — Telephone Encounter (Signed)
[facility] Pre-operative Risk Assessment     Request for surgical clearance:     Endoscopy Procedure  What type of surgery is being performed?     Colon   When is this surgery scheduled?     12-20-19  What type of clearance is required ?   Pharmacy  Are there any medications that need to be held prior to surgery and how long? Xarelto x 2  Practice name and name of physician performing surgery?      [facility]  What is your office phone and fax number?      Phone- [phone]  [phone]  Anesthesia type (None, local, MAC, general) ?       MAC

## 2019-11-23 NOTE — Patient Instructions (Addendum)
If you are age 58 or older, your body mass index should be between 23-30. Your Body mass index is 50.49 kg/m. If this is out of the aforementioned range listed, please consider follow up with your Primary Care Provider.  If you are age 51 or younger, your body mass index should be between 19-25. Your Body mass index is 50.49 kg/m. If this is out of the aformentioned range listed, please consider follow up with your Primary Care Provider.   You have been scheduled for a colonoscopy. Please follow written instructions given to you at your visit today.  Please pick up your prep supplies at the pharmacy within the next 1-3 days. If you use inhalers (even only as needed), please bring them with you on the day of your procedure.  Due to recent COVID-19 restrictions implemented by our local and state authorities and in an effort to keep both patients and staff as safe as possible, our hospital system now requires COVID-19 testing prior to any scheduled hospital procedure. Please go to [place], [place], [place] [address] on 12-17-19 at  10:35am. This is a drive up testing site, you will not need to exit your vehicle.  You will not be billed at the time of testing but may receive a bill later depending on your insurance. The approximate cost of the test is $100. You must agree to quarantine from the time of your testing until the procedure date on 12-20-19 . This should include staying at home with ONLY the people you live with. Avoid take-out, grocery store shopping or leaving the house for any non-emergent reason. Failure to have your COVID-19 test done on the date and time you have been scheduled will result in cancellation of procedure. Please call our office at [phone] if you have any questions.   You will be contacted by our office prior to your procedure for directions on holding your Xarelto  If you do not hear from our office 1 week prior to your scheduled procedure, please call [phone]  to discuss.   Due to recent changes in healthcare laws, you may see the results of your imaging and laboratory studies on MyChart before your provider has had a chance to review them.  We understand that in some cases there may be results that are confusing or concerning to you. Not all laboratory results come back in the same time frame and the provider may be waiting for multiple results in order to interpret others.  Please give [place] 48 hours in order for your provider to thoroughly review all the results before contacting the office for clarification of your results.   Thank you for entrusting me with your care and choosing [facility].  Dr Ardis Hughs

## 2019-11-23 NOTE — Telephone Encounter (Signed)
Patient with diagnosis of afib on Xarelto for anticoagulation.    Procedure: colonoscopy  Date of procedure: 12/20/19    CHA2DS2-VASc Score = 4  This indicates a 4.8% annual risk of stroke. The patient's score is based upon: CHF History: 1 HTN History: 1 Diabetes History: 1 Stroke History: 0 Vascular Disease History: 0 Age Score: 0 Gender Score: 1   CrCl 104 ml/min  Per office protocol, patient can hold Xarelto for 2 days prior to procedure.

## 2019-11-25 ENCOUNTER — Other Ambulatory Visit: Payer: Self-pay | Admitting: Internal Medicine

## 2019-11-26 NOTE — Telephone Encounter (Signed)
   Primary Cardiologist: Dr. Haroldine Laws   Chart reviewed as part of pre-operative protocol coverage. Patient has upcoming colonscopy scheduled for 12/20/2019 and we were asked to give our recommendation for holding Xarelto. Per Pharmacy and office protocol, patient can hold Xarelto for 2 days prior to procedure. This should be restarted as soon as possible after procedure.  I will route this recommendation to the requesting party via Epic fax function and remove from pre-op pool.  Please call with questions.  Darreld Mclean, PA-C 11/26/2019, 7:47 AM

## 2019-11-26 NOTE — Telephone Encounter (Signed)
Patient returned call and was advised to hold Xarelto x 2 days prior to colonoscopy scheduled on 12-20-19.  Patient advised to take last dose on 12-17-19, and will be advised when to restart after procedure.  Patient agreed to plan and verbalized understanding.  No further questions.

## 2019-11-26 NOTE — Telephone Encounter (Signed)
Left message on patient's voicemail to return call to further discuss holding Xarelto prior to procedure.  Will continue efforts.

## 2019-12-01 ENCOUNTER — Other Ambulatory Visit: Payer: Self-pay | Admitting: Internal Medicine

## 2019-12-05 DIAGNOSIS — G4730 Sleep apnea, unspecified: Secondary | ICD-10-CM | POA: Diagnosis not present

## 2019-12-05 DIAGNOSIS — J984 Other disorders of lung: Secondary | ICD-10-CM | POA: Diagnosis not present

## 2019-12-05 DIAGNOSIS — I270 Primary pulmonary hypertension: Secondary | ICD-10-CM | POA: Diagnosis not present

## 2019-12-05 DIAGNOSIS — I5030 Unspecified diastolic (congestive) heart failure: Secondary | ICD-10-CM | POA: Diagnosis not present

## 2019-12-11 ENCOUNTER — Other Ambulatory Visit: Payer: Self-pay

## 2019-12-11 ENCOUNTER — Other Ambulatory Visit: Payer: Self-pay | Admitting: *Deleted

## 2019-12-11 DIAGNOSIS — Z139 Encounter for screening, unspecified: Secondary | ICD-10-CM

## 2019-12-11 NOTE — Patient Instructions (Signed)
Visit Information  [name] was given information about Medicaid Managed Care team care coordination services as a part of their Healthy [facility] Medicaid benefit. [name] verbally consented to engagement with the [facility] Managed Care team.   For questions related to your Healthy [facility] health plan, please call: [phone] or visit the homepage here: [email or web address]  If you would like to schedule transportation through your Healthy [facility] plan, please call the following number at least 2 days in advance of your appointment: [phone]  Goals Addressed            This Visit's Progress   . Track and Manage Fluids and Swelling-Heart Failure       Timeframe:  Long-Range Goal Priority:  High Start Date:   12/11/19                          Expected End Date: 02/11/20                    Follow Up Date 01/11/20   - call office if I gain more than 2 pounds in one day or 5 pounds in one week - keep legs up while sitting - track weight in diary - use salt in moderation - watch for swelling in feet, ankles and legs every day - weigh myself daily    Why is this important?    It is important to check your weight daily and watch how much salt and liquids you have.   It will help you to manage your heart failure.        . Track and Manage Symptoms-Heart Failure       Timeframe:  Long-Range Goal Priority:  High Start Date:   12/11/19                          Expected End Date:   02/11/20                Follow Up Date 01/11/19   - begin a heart failure diary - eat more whole grains, fruits and vegetables, lean meats and healthy fats - know when to call the doctor - track symptoms and what helps feel better or worse    Why is this important?    You will be able to handle your symptoms better if you keep track of them.   Making some simple changes to your lifestyle will help.   Eating healthy is one thing you can do to take  good care of yourself.          Patient Care Plan: Heart Failure (Adult)    Problem Identified: Symtom Management     Long-Range Goal: Symptom Exacerbation Prevented or Minimized   Start Date: 12/11/2019  Expected End Date: 02/11/2020  This Visit's Progress: On track  Priority: High  Note:   Current Barriers:  . Chronic Disease Management support and education needs related management of multiple chronic diagnosis; CHF, COPD, OSA and T2DM. Today [name] reports having episodes of dizziness. This is a new symptom for her. She has not notified her provider. Le[place]is [place] social connections . Does not contact provider office for questions/concerns  Nurse Case Manager Clinical Goal(s):  Marland Kitchen Over the next 30 days, patient will verbalize understanding of plan for management of new symptoms . Over the next 30 days, patient will attend all scheduled medical appointments: Pulmonology 12/13/19, Colonoscopy on 12/20/19 .  Over the next 30 days, patient will demonstrate improved health management independence as evidenced by reporting new symptoms or concerns to provider  Interventions:  . Inter-disciplinary care team collaboration (see longitudinal plan of care) . Advised patient to call PCP and report new onset of dizziness . Reviewed medications with patient and discussed taking as directed . Discussed plans with patient for ongoing care management follow up and provided patient with direct contact information for care management team . Provided patient and/or caregiver with number for [employer], patient to call and request a pulse oximeter and scale.  Patient Goals/Self-Care Activities Over the next 30 days, patient will: - begin a heart failure diary - eat more whole grains, fruits and vegetables, lean meats and healthy fats - know when to call the doctor - track symptoms and what helps feel better or worse  -Self administers medications as prescribed  -Calls pharmacy for medication  refills - Calls provider office for new concerns or questions  Follow Up Plan: Telephone follow up appointment with Managed Medicaid care management team member scheduled for:01/11/19 @ 2:30pm      Please see education materials related to dizziness provided as print materials and mailed.    Dizziness Dizziness is a common problem. It makes you feel unsteady or light-headed. You may feel like you are about to pass out (faint). Dizziness can lead to getting hurt if you stumble or fall. Dizziness can be caused by many things, including:  Medicines.  Not having enough water in your body (dehydration).  Illness. Follow these instructions at home: Eating and drinking   Drink enough fluid to keep your pee (urine) clear or pale yellow. This helps to keep you from getting dehydrated. Try to drink more clear fluids, such as water.  Do not drink alcohol.  Limit how much caffeine you drink or eat, if your doctor tells you to do that.  Limit how much salt (sodium) you drink or eat, if your doctor tells you to do that. Activity   Avoid making quick movements. ? When you stand up from sitting in a chair, steady yourself until you feel okay. ? In the morning, first sit up on the side of the bed. When you feel okay, stand slowly while you hold onto something. Do this until you know that your balance is fine.  If you need to stand in one place for a long time, move your legs often. Tighten and relax the muscles in your legs while you are standing.  Do not drive or use heavy machinery if you feel dizzy.  Avoid bending down if you feel dizzy. Place items in your home so you can reach them easily without leaning over. Lifestyle  Do not use any products that contain nicotine or tobacco, such as cigarettes and e-cigarettes. If you need help quitting, ask your doctor.  Try to lower your stress level. You can do this by using methods such as yoga or meditation. Talk with your doctor if you need  help. General instructions  Watch your dizziness for any changes.  Take over-the-counter and prescription medicines only as told by your doctor. Talk with your doctor if you think that you are dizzy because of a medicine that you are taking.  Tell a friend or a family member that you are feeling dizzy. If he or she notices any changes in your behavior, have this person call your doctor.  Keep all follow-up visits as told by your doctor. This is important. Contact a  doctor if:  Your dizziness does not go away.  Your dizziness or light-headedness gets worse.  You feel sick to your stomach (nauseous).  You have trouble hearing.  You have new symptoms.  You are unsteady on your feet.  You feel like the room is spinning. Get help right away if:  You throw up (vomit) or have watery poop (diarrhea), and you cannot eat or drink anything.  You have trouble: ? Talking. ? Walking. ? Swallowing. ? Using your arms, hands, or legs.  You feel generally weak.  You are not thinking clearly, or you have trouble forming sentences. A friend or family member may notice this.  You have: ? Chest pain. ? Pain in your belly (abdomen). ? Shortness of breath. ? Sweating.  Your vision changes.  You are bleeding.  You have a very bad headache.  You have neck pain or a stiff neck.  You have a fever. These symptoms may be an emergency. Do not wait to see if the symptoms will go away. Get medical help right away. Call your local emergency services (911 in the [place] Do not drive yourself to the hospital. Summary  Dizziness makes you feel unsteady or light-headed. You may feel like you are about to pass out (faint).  Drink enough fluid to keep your pee (urine) clear or pale yellow. Do not drink alcohol.  Avoid making quick movements if you feel dizzy.  Watch your dizziness for any changes. This information is not intended to replace advice given to you by your health care provider.  Make sure you discuss any questions you have with your health care provider. Document Revised: 12/24/2016 Document Reviewed: 01/08/2016 Elsevier Patient Education  [employer].   The patient verbalized understanding of instructions provided today and agreed to receive a mailed copy of patient instruction and/or educational materials.  Telephone follow up appointment with Managed Medicaid care management team member scheduled for:01/11/19 @ 2:30pm  Faith Joiner RN, BSN [place] RN Care Coordinator   Melissa Montane, RN

## 2019-12-11 NOTE — Patient Outreach (Signed)
Care Coordination - Case Manager  12/11/2019  GENEE RANN [birth year] [id number]  Subjective:  [name] is an 59 y.o. year old female who is a primary patient of [name], MD.  [name] was given information about Medicaid Managed Care team care coordination services today. [name] agreed to services and verbal consent obtained  Review of patient status, laboratory and other test data was performed as part of evaluation for provision of services.  SDOH: SDOH Screenings   Alcohol Screen:   . Last Alcohol Screening Score (AUDIT): Not on file  Depression (PHQ2-9): Medium Risk  . PHQ-2 Score: 13  Financial Resource Strain:   . Difficulty of Paying Living Expenses: Not on file  Food Insecurity: No Food Insecurity  . Worried About Charity fundraiser in the Last Year: Never true  . Ran Out of Food in the Last Year: Never true  Housing: Low Risk   . Last Housing Risk Score: 0  Social Connections: Moderately Isolated  . Frequency of Communication with Friends and Family: Once a week  . Frequency of Social Gatherings with Friends and Family: More than three times a week  . Attends Religious Services: 1 to 4 times per year  . Active Member of Clubs or Organizations: No  . Attends Archivist Meetings: Never  . Marital Status: Never married  Stress:   . Feeling of Stress : Not on file  Tobacco Use: Low Risk   . Smoking Tobacco Use: Never Smoker  . Smokeless Tobacco Use: Never Used  Transportation Needs: No Transportation Needs  . Lack of Transportation (Medical): No  . Lack of Transportation (Non-Medical): No     Objective:    Allergies  Allergen Reactions  . Aspirin Swelling    REACTION: airway swelling  . Codeine Other (See Comments)    REACTION: tingling in lips and hard breathing - had reaction at dentist - states "I can't take certain kinds of codeine" - happened maybe 10 yr ago  . Lisinopril Cough  . Sulfonamide Derivatives  Swelling    REACTION: airway swelling  . Latex Rash    Medications:    Medications Reviewed Today    Reviewed by Melissa Montane, RN (Registered Nurse) on 12/11/19 at 1512  Med List Status: <None>  Medication Order Taking? Sig Documenting Provider Last Dose Status Informant  ACCU-CHEK AVIVA PLUS test strip [id number] No USE 1 STRIP TO CHECK GLUCOSE ONCE DAILY  Patient not taking: Reported on 12/11/2019   [name], MD Not Taking Active Other  acetaminophen (TYLENOL) 500 MG tablet [id number] Yes Take 1 tablet (500 mg total) by mouth every 6 (six) hours as needed. [name], MD Taking Active Self        Discontinued 01/29/19 1321 (Reorder)   cetirizine (ZYRTEC) 10 MG tablet [id number] Yes Take 1 tablet by mouth once daily [name], [name], DO Taking Active   clobetasol ointment (TEMOVATE) 0.05 % [id number] Yes Apply 1 application topically as directed. Apply to vulva: qhs x 4wks & then every other night x 4wks, then 2x/week [name], MD Taking Active   cyanocobalamin 500 MCG tablet [id number] Yes Take 500 mcg by mouth daily. Vitamin b12 [provider] Taking Active Self  cycloSPORINE (RESTASIS) 0.05 % ophthalmic emulsion [id number] Yes Place 1 drop into both eyes 2 (two) times daily as needed (dry eyes). [provider] Taking Active Self  diphenhydrAMINE (BENADRYL) 25 MG tablet [id number] Yes Take 50-100 mg by mouth  2 (two) times daily as needed for itching. [provider] Taking Active Self  fluticasone (FLONASE) 50 MCG/ACT nasal spray [id number] Yes Place 1 spray into both nostrils daily. [name], MD Taking Active   gabapentin (NEURONTIN) 300 MG capsule [id number] Yes TAKE 2 CAPSULES BY MOUTH THREE TIMES DAILY [name], MD Taking Active   metFORMIN (GLUCOPHAGE) 500 MG tablet [id number] Yes TAKE 1/2 (ONE-HALF) TABLET BY MOUTH WITH BREAKFAST Oda Kilts, MD Taking Active   Na Sulfate-K Sulfate-Mg Sulf (SUPREP BOWEL PREP KIT)  17.5-3.13-1.6 GM/177ML SOLN [id number] No Take 1 kit by mouth as directed.  Patient not taking: Reported on 12/11/2019   [name], MD Not Taking Active   OXYGEN [id number] Yes Inhale 4 L into the lungs continuous. [provider] Taking Active Self  pantoprazole (PROTONIX) 20 MG tablet [id number] Yes Take 1 tablet (20 mg total) by mouth 2 (two) times daily. [name], MD Taking Active Self  potassium chloride SA (KLOR-CON) 20 MEQ tablet [id number] Yes TAKE 2 TABLETS BY MOUTH THREE TIMES DAILY [name], MD Taking Active            Med Note ([name]   Tue Dec 11, 2019  3:11 PM) Taking twice daily  pravastatin (PRAVACHOL) 20 MG tablet [id number] Yes Take 1 tablet (20 mg total) by mouth daily. [name], MD Taking Active   rivaroxaban (XARELTO) 20 MG TABS tablet [id number] Yes TAKE 1 TABLET BY MOUTH ONCE DAILY WITH SUPPER [name], MD Taking Active   sildenafil (REVATIO) 20 MG tablet [id number] Yes Take 1 tablet (20 mg total) by mouth 3 (three) times daily. Bensimhon, [name], MD Taking Active Self  spironolactone (ALDACTONE) 25 MG tablet [id number] Yes Take 1/2 (one-half) tablet by mouth once daily [name], MD Taking Active   torsemide (DEMADEX) 20 MG tablet [id number] Yes TAKE 4 TABLETS BY MOUTH TWICE DAILY [name], MD Taking Active   treprostinil (REMODULIN) 5 MG/ML SOLN injection [id number] Yes See admin instructions. As of 08/09/2019: Add 7 ml of Remodulin to cassette and 93 ml of sterile diluent for Remodulin to cassetet for a total volume of 100 ml to make a concentration of 350,000 ng per ml.  Infuse via a CADD pump intravenously at a rate of 40 ml per 24 hours. Based on a dosing weight of 120 kg, the dose is 81 ng per kg per min.  Once opened, discard Remodulin vial after 30 days.  Sterile Diluent for Remodulin vials are single use only. Once mixed, discard cassette after 48 hours. [provider] Taking Active  Self           Med Note [name], [name] A   Thu Nov 08, 2019  3:00 PM)            Assessment:   Patient Care Plan: Heart Failure (Adult)    Problem Identified: Symtom Management     Long-Range Goal: Symptom Exacerbation Prevented or Minimized   Start Date: 12/11/2019  Expected End Date: 02/11/2020  This Visit's Progress: On track  Priority: High  Note:   Current Barriers:  . Chronic Disease Management support and education needs related management of multiple chronic diagnosis; CHF, COPD, OSA and T2DM. Today Ms Lasch reports having episodes of dizziness. This is a new symptom for her. She has not notified her provider. Leodis [place] social connections . Does not contact provider office for questions/concerns  Nurse Case Manager Clinical Goal(s):  .  Over the next 30 days, patient will verbalize understanding of plan for management of new symptoms . Over the next 30 days, patient will attend all scheduled medical appointments: Pulmonology 12/13/19, Colonoscopy on 12/20/19 . Over the next 30 days, patient will demonstrate improved health management independence as evidenced by reporting new symptoms or concerns to provider  Interventions:  . Inter-disciplinary care team collaboration (see longitudinal plan of care) . Advised patient to call PCP and report new onset of dizziness . Reviewed medications with patient and discussed taking as directed . Discussed plans with patient for ongoing care management follow up and provided patient with direct contact information for care management team . Provided patient and/or caregiver with number for [employer], patient to call and request a pulse oximeter and scale.  Patient Goals/Self-Care Activities Over the next 30 days, patient will: - begin a heart failure diary - eat more whole grains, fruits and vegetables, lean meats and healthy fats - know when to call the doctor - track symptoms and what helps feel better or worse  -Self administers  medications as prescribed  -Calls pharmacy for medication refills - Calls provider office for new concerns or questions  Follow Up Plan: Telephone follow up appointment with Managed Medicaid care management team member scheduled for:01/11/19 @ 2:30pm       Plan: RNCM will follow up with a telephone call on 01/11/20 @ 2:30pm Follow-up:  Patient agrees to Care Plan and Follow-up.   [name] RN, BSN [place]  [employer] manager

## 2019-12-13 ENCOUNTER — Other Ambulatory Visit: Payer: Self-pay

## 2019-12-13 ENCOUNTER — Encounter: Payer: Self-pay | Admitting: Pulmonary Disease

## 2019-12-13 ENCOUNTER — Ambulatory Visit (INDEPENDENT_AMBULATORY_CARE_PROVIDER_SITE_OTHER): Payer: Medicaid Other | Admitting: Pulmonary Disease

## 2019-12-13 VITALS — BP 128/86 | HR 88 | Temp 97.6°F | Ht 61.0 in | Wt 291.2 lb

## 2019-12-13 DIAGNOSIS — I50810 Right heart failure, unspecified: Secondary | ICD-10-CM

## 2019-12-13 DIAGNOSIS — I272 Pulmonary hypertension, unspecified: Secondary | ICD-10-CM

## 2019-12-13 LAB — CBC WITH DIFFERENTIAL/PLATELET
Basophils Absolute: 0 10*3/uL (ref 0.0–0.1)
Basophils Relative: 0.1 % (ref 0.0–3.0)
Eosinophils Absolute: 0 10*3/uL (ref 0.0–0.7)
Eosinophils Relative: 0 % (ref 0.0–5.0)
HCT: 33.5 % — ABNORMAL LOW (ref 36.0–46.0)
Hemoglobin: 10.8 g/dL — ABNORMAL LOW (ref 12.0–15.0)
Lymphocytes Relative: 23.9 % (ref 12.0–46.0)
Lymphs Abs: 1.9 10*3/uL (ref 0.7–4.0)
MCHC: 32.3 g/dL (ref 30.0–36.0)
MCV: 78.6 fl (ref 78.0–100.0)
Monocytes Absolute: 1 10*3/uL (ref 0.1–1.0)
Monocytes Relative: 11.9 % (ref 3.0–12.0)
Neutro Abs: 5.2 10*3/uL (ref 1.4–7.7)
Neutrophils Relative %: 64.1 % (ref 43.0–77.0)
Platelets: 451 10*3/uL — ABNORMAL HIGH (ref 150.0–400.0)
RBC: 4.26 Mil/uL (ref 3.87–5.11)
RDW: 17.6 % — ABNORMAL HIGH (ref 11.5–15.5)
WBC: 8.1 10*3/uL (ref 4.0–10.5)

## 2019-12-13 LAB — BASIC METABOLIC PANEL
BUN: 12 mg/dL (ref 6–23)
CO2: 32 mEq/L (ref 19–32)
Calcium: 8.8 mg/dL (ref 8.4–10.5)
Chloride: 100 mEq/L (ref 96–112)
Creatinine, Ser: 0.77 mg/dL (ref 0.40–1.20)
GFR: 84.96 mL/min (ref >60.00)
Glucose, Bld: 91 mg/dL (ref 70–99)
Potassium: 4 mEq/L (ref 3.5–5.1)
Sodium: 140 mEq/L (ref 135–145)

## 2019-12-13 NOTE — Progress Notes (Signed)
[name]    [id number]    Jun 17, 1960  Primary Care Physician:Johnson, Rodman Key, MD  Referring Physician: [name], MD [place],  [place] [address]  Chief complaint:   Patient with a history of obstructive sleep apnea History of pulmonary hypertension Chronic respiratory failure  HPI:  She is on AutoSet CPAP minimum pressure of 16, maximum pressure of 20  Has been doing relatively well She does have some episodes of dizziness sometimes when she is just sitting and sometimes when she is upright trying to make a self meal Comes and goes, lasts only a few minutes  She is not feeling acutely ill  Of note she is on multiple medications for blood pressure and diuretics  Very compliant with a CPAP  History of pulmonary hypertension History of chronic systolic heart failure  Morbid obesity, cognitive impairment, diastolic heart failure, paroxysmal atrial fibrillation, cor pulmonale, severe pulmonary hypertension -Currently on treprostinil and sildenafil  Feels relatively well Sleeps well at night goes to bed about midnight 3-4 awakenings Weight fluctuates depending on fluid levels  Recent right heart cart noted from cardiology documentation  She is on 4 L oxygen Recent exercise study reveals 68% predicted Able to walk about 263 m  She has been on BiPAP now for about 6 years.  Outpatient Encounter Medications as of 12/13/2019  Medication Sig  . ACCU-CHEK AVIVA PLUS test strip USE 1 STRIP TO CHECK GLUCOSE ONCE DAILY (Patient not taking: Reported on 12/11/2019)  . acetaminophen (TYLENOL) 500 MG tablet Take 1 tablet (500 mg total) by mouth every 6 (six) hours as needed.  . cetirizine (ZYRTEC) 10 MG tablet Take 1 tablet by mouth once daily  . clobetasol ointment (TEMOVATE) 1.75 % Apply 1 application topically as directed. Apply to vulva: qhs x 4wks & then every other night x 4wks, then 2x/week  . cyanocobalamin 500 MCG tablet Take 500 mcg by  mouth daily. Vitamin b12  . cycloSPORINE (RESTASIS) 0.05 % ophthalmic emulsion Place 1 drop into both eyes 2 (two) times daily as needed (dry eyes).  . diphenhydrAMINE (BENADRYL) 25 MG tablet Take 50-100 mg by mouth 2 (two) times daily as needed for itching.  . fluticasone (FLONASE) 50 MCG/ACT nasal spray Place 1 spray into both nostrils daily.  Marland Kitchen gabapentin (NEURONTIN) 300 MG capsule TAKE 2 CAPSULES BY MOUTH THREE TIMES DAILY  . metFORMIN (GLUCOPHAGE) 500 MG tablet TAKE 1/2 (ONE-HALF) TABLET BY MOUTH WITH BREAKFAST  . Na Sulfate-K Sulfate-Mg Sulf (SUPREP BOWEL PREP KIT) 17.5-3.13-1.6 GM/177ML SOLN Take 1 kit by mouth as directed. (Patient not taking: Reported on 12/11/2019)  . OXYGEN Inhale 4 L into the lungs continuous.  . pantoprazole (PROTONIX) 20 MG tablet Take 1 tablet (20 mg total) by mouth 2 (two) times daily.  . potassium chloride SA (KLOR-CON) 20 MEQ tablet TAKE 2 TABLETS BY MOUTH THREE TIMES DAILY  . pravastatin (PRAVACHOL) 20 MG tablet Take 1 tablet (20 mg total) by mouth daily.  . rivaroxaban (XARELTO) 20 MG TABS tablet TAKE 1 TABLET BY MOUTH ONCE DAILY WITH SUPPER  . sildenafil (REVATIO) 20 MG tablet Take 1 tablet (20 mg total) by mouth 3 (three) times daily.  Marland Kitchen spironolactone (ALDACTONE) 25 MG tablet Take 1/2 (one-half) tablet by mouth once daily  . torsemide (DEMADEX) 20 MG tablet TAKE 4 TABLETS BY MOUTH TWICE DAILY  . treprostinil (REMODULIN) 5 MG/ML SOLN injection See admin instructions. As of 08/09/2019: Add 7 ml of Remodulin to  cassette and 93 ml of sterile diluent for Remodulin to cassetet for a total volume of 100 ml to make a concentration of 350,000 ng per ml.  Infuse via a CADD pump intravenously at a rate of 40 ml per 24 hours. Based on a dosing weight of 120 kg, the dose is 81 ng per kg per min.  Once opened, discard Remodulin vial after 30 days.  Sterile Diluent for Remodulin vials are single use only. Once mixed, discard cassette after 48 hours.  . [DISCONTINUED] cetirizine  (ZYRTEC) 10 MG tablet Take 1 tablet by mouth once daily   No facility-administered encounter medications on file as of 12/13/2019.    Allergies as of 12/13/2019 - Review Complete 12/13/2019  Allergen Reaction Noted  . Aspirin Swelling 12/09/2005  . Codeine Other (See Comments)   . Lisinopril Cough 05/02/2012  . Sulfonamide derivatives Swelling 10/19/2005  . Latex Rash 01/07/2016    Past Medical History:  Diagnosis Date  . Anemia, iron deficiency    secondary to menhorrhagia, on oral iron, also b12 def, getting monthly b12 shots  . Cellulitis 05/2019   RIGHT LOWER EXTREMITY  . CHF (congestive heart failure) ([facility])   . Chronic cough    secondary to alleriges and post nasal drip  . Cognitive impairment   . COPD (chronic obstructive pulmonary disease) ([place])   . Cor pulmonale (H[facility])    PA Peak pressure 59mHg  . Depression   . Diabetes mellitus    well controlled on metformin  . Diastolic heart failure ([facility]   . GERD (gastroesophageal reflux disease)   . H/O mental retardation   . Herpes   . Hyperlipidemia   . Hypertension   . OSA (obstructive sleep apnea)    CPAP  . Pulmonary hypertension ([facility]   . Renal disorder   . Restrictive lung disease    PFTs 06/2012 (FVC 54% predicted and FEV1 68% predicted w minimal bronchodilator response).  . Shortness of breath   . Venous stasis ulcer ([facility]    chornic, ?followed up at wound care center, multiple courses of antibiotics in past for cellulitis, on lasix    Past Surgical History:  Procedure Laterality Date  . CARDIAC CATHETERIZATION N/A 01/13/2016   Procedure: Right Heart Cath;  Surgeon: DJolaine Artist MD;  [facility];  Service: Cardiovascular;  Laterality: N/A;  . CHOLECYSTECTOMY    . IR FLUORO GUIDE CV LINE LEFT  11/16/2019  . IR FLUORO GUIDE CV LINE RIGHT  05/09/2019  . IR FLUORO GUIDE CV LINE RIGHT  08/12/2019  . IR REMOVAL TUN CV CATH W/O FL  05/05/2019  . IR [place] ACCESS LEFT  11/16/2019  . IR [place] GUIDE VASC ACCESS RIGHT  05/09/2019  . IR [place] VASC ACCESS RIGHT  08/12/2019  . IR [place] VASC ACCESS RIGHT  11/16/2019  . IR VENO/JUGULAR RIGHT  11/16/2019  . LEFT AND RIGHT HEART CATHETERIZATION WITH CORONARY ANGIOGRAM N/A 02/28/2014   Procedure: LEFT AND RIGHT HEART CATHETERIZATION WITH CORONARY ANGIOGRAM;  Surgeon: DJolaine Artist MD;  [facility];  Service: Cardiovascular;  Laterality: N/A;  . RIGHT HEART CATH N/A 06/27/2018   Procedure: RIGHT HEART CATH;  Surgeon: BJolaine Artist MD;  [facility];  Service: Cardiovascular;  Laterality: N/A;  . RIGHT/LEFT HEART CATH AND CORONARY ANGIOGRAPHY N/A 05/14/2019   Procedure: RIGHT/LEFT HEART CATH AND CORONARY ANGIOGRAPHY;  Surgeon: BJolaine Artist MD;  [facility];  Service: Cardiovascular;  Laterality: N/A;    Family History  Problem Relation Age of Onset  . Kidney Stones Son   . Mental illness Sister   . Bipolar disorder Sister   . Mental retardation Brother   . Hyperlipidemia Mother   . Breast cancer Maternal Aunt   . Colon cancer Neg Hx   . Pancreatic cancer Neg Hx   . Esophageal cancer Neg Hx     Social History   Socioeconomic History  . Marital status: Single    Spouse name: Not on file  . Number of children: Not on file  . Years of education: Not on file  . Highest education level: Not on file  Occupational History  . Not on file  Tobacco Use  . Smoking status: Never Smoker  . Smokeless tobacco: Never Used  Vaping Use  . Vaping Use: Never used  Substance and Sexual Activity  . Alcohol use: No    Alcohol/week: 0.0 standard drinks  . Drug use: No  . Sexual activity: Not Currently  Other Topics Concern  . Not on file  Social History Narrative  . Not on file   Social Determinants of Health   Financial Resource Strain: Not on file  Food Insecurity: No Food Insecurity  . Worried About Charity fundraiser in the Last Year: Never true  . Ran Out of Food in  the Last Year: Never true  Transportation Needs: No Transportation Needs  . Lack of Transportation (Medical): No  . Lack of Transportation (Non-Medical): No  Physical Activity: Not on file  Stress: Not on file  Social Connections: Moderately Isolated  . Frequency of Communication with Friends and Family: Once a week  . Frequency of Social Gatherings with Friends and Family: More than three times a week  . Attends Religious Services: 1 to 4 times per year  . Active Member of Clubs or Organizations: No  . Attends Archivist Meetings: Never  . Marital Status: Never married  Intimate Partner Violence: Not on file    Review of Systems  Constitutional: Positive for fatigue.  Respiratory: Positive for apnea and shortness of breath.   Cardiovascular: Positive for leg swelling.  Gastrointestinal: Negative.   Genitourinary: Negative.   Musculoskeletal: Positive for arthralgias.  Psychiatric/Behavioral: Positive for sleep disturbance.    Vitals:   12/13/19 1426  BP: 128/86  Pulse: 88  Temp: 97.6 F (36.4 C)  SpO2: 96%     Physical Exam Constitutional:      Appearance: She is obese.  HENT:     Head: Normocephalic.  Eyes:     General:        Right eye: No discharge.        Left eye: No discharge.  Cardiovascular:     Rate and Rhythm: Normal rate and regular rhythm.     Pulses: Normal pulses.     Heart sounds: No murmur heard.   Pulmonary:     Effort: Pulmonary effort is normal. No respiratory distress.     Breath sounds: Normal breath sounds. No stridor. No wheezing or rhonchi.  Musculoskeletal:        General: Swelling present. Normal range of motion.     Cervical back: No rigidity or tenderness.  Skin:    General: Skin is warm.  Neurological:     Mental Status: She is alert.  Psychiatric:        Mood and Affect: Mood normal.    Results of the Epworth flowsheet 08/22/2019  Sitting and reading 0  Watching TV 1  Sitting, inactive in a public place (e.g.  a theatre or a meeting) 2  As a passenger in a car for an hour without a break 2  Lying down to rest in the afternoon when circumstances permit 1  Sitting and talking to someone 0  Sitting quietly after a lunch without alcohol 1  In a car, while stopped for a few minutes in traffic 0  Total score 7   Data Reviewed: Right heart catheterization results noted  Care everywhere report by Dr. Gilles Chiquito reviewed  Compliance data reveals 100% compliance AutoSet 16-20 95 percentile pressure of 17.2, maximum of 17.8 Current AHI is 6.5 better than 9.5 on previous record No significant air leak  Assessment:  History of obstructive sleep apnea on CPAP therapy She is on auto CPAP 16-20 Compliance is 100% -Appears to be doing well and tolerating this well  Pulmonary hypertension -On Remodulin, sildenafil  Diastolic heart failure Remains on diuretics-torsemide 80 mg twice daily  Morbid obesity -Ambulates with a walker -Tries to stay active  Atrial flutter -On anticoagulation  Deconditioning -Encouraged to stay active  Complaints of dizziness -Concerned about possible dehydration -We will obtain BMP, also obtain CBC with differentials  Plan/Recommendations:  Continue using CPAP on a regular basis  Obtain blood work  Encouraged to call with any significant concerns  Follow-up in 3 months  Encouraged to start checking her blood pressure on a regular basis, she does have a blood pressure machine at home-monitor for any hypotension with multiple medications she is on   Sherrilyn Rist MD [facility] 12/13/2019, 2:28 PM  CC: [name], MD

## 2019-12-13 NOTE — Addendum Note (Signed)
Addended by: Suzzanne Cloud E on: 12/13/2019 02:49 PM   Modules accepted: Orders

## 2019-12-13 NOTE — Patient Instructions (Signed)
Dizzy spells  Obstructive sleep apnea  Obtain CBC and BMP  Continue CPAP on a regular basis  Call with significant concerns  Encouraged to check blood pressure regularly to ensure not running low blood pressures  Follow-up in 3 months

## 2019-12-17 ENCOUNTER — Other Ambulatory Visit (HOSPITAL_COMMUNITY)
Admission: RE | Admit: 2019-12-17 | Discharge: 2019-12-17 | Disposition: A | Discharge status: Home / Self Care | Payer: Medicaid Other | Source: Ambulatory Visit | Attending: Gastroenterology | Admitting: Gastroenterology

## 2019-12-17 ENCOUNTER — Other Ambulatory Visit (HOSPITAL_COMMUNITY): Payer: Self-pay | Admitting: Internal Medicine

## 2019-12-17 DIAGNOSIS — Z20822 Contact with and (suspected) exposure to covid-19: Secondary | ICD-10-CM | POA: Diagnosis not present

## 2019-12-17 DIAGNOSIS — I270 Primary pulmonary hypertension: Secondary | ICD-10-CM

## 2019-12-17 DIAGNOSIS — Z01812 Encounter for preprocedural laboratory examination: Secondary | ICD-10-CM | POA: Insufficient documentation

## 2019-12-17 LAB — SARS CORONAVIRUS 2 (TAT 6-24 HRS): SARS Coronavirus 2: NEGATIVE

## 2019-12-19 ENCOUNTER — Encounter: Payer: Self-pay | Admitting: Student

## 2019-12-19 ENCOUNTER — Encounter (HOSPITAL_COMMUNITY): Payer: Self-pay | Admitting: Gastroenterology

## 2019-12-20 ENCOUNTER — Telehealth: Payer: Self-pay

## 2019-12-20 ENCOUNTER — Encounter (HOSPITAL_COMMUNITY): Payer: Self-pay | Admitting: Gastroenterology

## 2019-12-20 ENCOUNTER — Other Ambulatory Visit: Payer: Self-pay

## 2019-12-20 ENCOUNTER — Encounter (HOSPITAL_COMMUNITY)
Admission: RE | Disposition: A | Discharge status: Home / Self Care | Payer: Self-pay | Source: Home / Self Care | Attending: Gastroenterology

## 2019-12-20 ENCOUNTER — Ambulatory Visit (HOSPITAL_COMMUNITY): Payer: Medicaid Other | Admitting: Anesthesiology

## 2019-12-20 ENCOUNTER — Ambulatory Visit (HOSPITAL_COMMUNITY)
Admission: RE | Admit: 2019-12-20 | Discharge: 2019-12-20 | Disposition: A | Discharge status: Home / Self Care | Payer: Medicaid Other | Attending: Gastroenterology | Admitting: Gastroenterology

## 2019-12-20 DIAGNOSIS — Z9981 Dependence on supplemental oxygen: Secondary | ICD-10-CM | POA: Insufficient documentation

## 2019-12-20 DIAGNOSIS — Z885 Allergy status to narcotic agent status: Secondary | ICD-10-CM | POA: Insufficient documentation

## 2019-12-20 DIAGNOSIS — I4891 Unspecified atrial fibrillation: Secondary | ICD-10-CM | POA: Diagnosis not present

## 2019-12-20 DIAGNOSIS — I480 Paroxysmal atrial fibrillation: Secondary | ICD-10-CM | POA: Insufficient documentation

## 2019-12-20 DIAGNOSIS — Z79899 Other long term (current) drug therapy: Secondary | ICD-10-CM | POA: Diagnosis not present

## 2019-12-20 DIAGNOSIS — J9610 Chronic respiratory failure, unspecified whether with hypoxia or hypercapnia: Secondary | ICD-10-CM | POA: Insufficient documentation

## 2019-12-20 DIAGNOSIS — Z7901 Long term (current) use of anticoagulants: Secondary | ICD-10-CM | POA: Diagnosis not present

## 2019-12-20 DIAGNOSIS — K644 Residual hemorrhoidal skin tags: Secondary | ICD-10-CM | POA: Insufficient documentation

## 2019-12-20 DIAGNOSIS — Z888 Allergy status to other drugs, medicaments and biological substances status: Secondary | ICD-10-CM | POA: Insufficient documentation

## 2019-12-20 DIAGNOSIS — Z6841 Body Mass Index (BMI) 40.0 and over, adult: Secondary | ICD-10-CM | POA: Insufficient documentation

## 2019-12-20 DIAGNOSIS — Z886 Allergy status to analgesic agent status: Secondary | ICD-10-CM | POA: Diagnosis not present

## 2019-12-20 DIAGNOSIS — Z7984 Long term (current) use of oral hypoglycemic drugs: Secondary | ICD-10-CM | POA: Diagnosis not present

## 2019-12-20 DIAGNOSIS — Z882 Allergy status to sulfonamides status: Secondary | ICD-10-CM | POA: Diagnosis not present

## 2019-12-20 DIAGNOSIS — I4892 Unspecified atrial flutter: Secondary | ICD-10-CM | POA: Diagnosis not present

## 2019-12-20 DIAGNOSIS — I2722 Pulmonary hypertension due to left heart disease: Secondary | ICD-10-CM | POA: Insufficient documentation

## 2019-12-20 DIAGNOSIS — K648 Other hemorrhoids: Secondary | ICD-10-CM | POA: Diagnosis not present

## 2019-12-20 DIAGNOSIS — K625 Hemorrhage of anus and rectum: Secondary | ICD-10-CM

## 2019-12-20 DIAGNOSIS — I5030 Unspecified diastolic (congestive) heart failure: Secondary | ICD-10-CM | POA: Diagnosis not present

## 2019-12-20 HISTORY — PX: COLONOSCOPY WITH PROPOFOL: SHX5780

## 2019-12-20 LAB — GLUCOSE, CAPILLARY: Glucose-Capillary: 108 mg/dL — ABNORMAL HIGH (ref 70–99)

## 2019-12-20 SURGERY — COLONOSCOPY WITH PROPOFOL
Anesthesia: Monitor Anesthesia Care

## 2019-12-20 MED ORDER — PROPOFOL 500 MG/50ML IV EMUL
INTRAVENOUS | Status: DC | PRN
  Administered 2019-12-20: 09:00:00 10 mg via INTRAVENOUS

## 2019-12-20 MED ORDER — LACTATED RINGERS IV SOLN: INTRAVENOUS | Status: DC | PRN

## 2019-12-20 MED ORDER — SODIUM CHLORIDE 0.9 % IV SOLN: INTRAVENOUS | Status: DC

## 2019-12-20 MED ORDER — PROPOFOL 500 MG/50ML IV EMUL
INTRAVENOUS | Status: DC | PRN
  Administered 2019-12-20: 75 ug/kg/min via INTRAVENOUS

## 2019-12-20 MED ORDER — LACTATED RINGERS IV SOLN: INTRAVENOUS | Status: DC

## 2019-12-20 MED ORDER — PROPOFOL 1000 MG/100ML IV EMUL
INTRAVENOUS | Status: AC
  Filled 2019-12-20: qty 100

## 2019-12-20 SURGICAL SUPPLY — 22 items

## 2019-12-20 NOTE — Op Note (Signed)
[facility] Patient Name: [name] Procedure Date: 12/20/2019 MRN: [id number] Attending MD: [name] , MD Date of Birth: [birth year] CSN: [id number] Age: 59 Admit Type: Outpatient Procedure:                Colonoscopy Indications:              Rectal bleeding Providers:                [name], MD, Angus Seller, Tyrone Apple, Technician, Laureen Abrahams, Arnoldo Hooker, CRNA Referring MD:              Medicines:                Monitored Anesthesia Care Complications:            No immediate complications. Estimated blood loss:                            None. Estimated Blood Loss:     Estimated blood loss: none. Procedure:                Pre-Anesthesia Assessment:                           - Prior to the procedure, a History and Physical                            was performed, and patient medications and                            allergies were reviewed. The patient's tolerance of                            previous anesthesia was also reviewed. The risks                            and benefits of the procedure and the sedation                            options and risks were discussed with the patient.                            All questions were answered, and informed consent                            was obtained. Prior Anticoagulants: The patient has                            taken Xarelto (rivaroxaban), last dose was 2 days                            prior to procedure. ASA Grade Assessment: IV - A  patient with severe systemic disease that is a                            constant threat to life. After reviewing the risks                            and benefits, the patient was deemed in                            satisfactory condition to undergo the procedure.                           After obtaining informed consent, the colonoscope                            was passed under direct  vision. Throughout the                            procedure, the patient's blood pressure, pulse, and                            oxygen saturations were monitored continuously. The                            CF-HQ190L ([id number]) Olympus colonoscope was                            introduced through the anus and advanced to the the                            cecum, identified by appendiceal orifice and                            ileocecal valve. The colonoscopy was performed                            without difficulty. The patient tolerated the                            procedure well. The quality of the bowel                            preparation was good. The ileocecal valve,                            appendiceal orifice, and rectum were photographed. Scope In: 9:00:50 AM Scope Out: 9:22:26 AM Scope Withdrawal Time: 0 hours 10 minutes 39 seconds  Total Procedure Duration: 0 hours 21 minutes 36 seconds  Findings:      Inflammed, slightly eroded, prolapsing internal hemorrhoids, several       columns.      Small external hemorrhoids.      The exam was otherwise without abnormality on direct and retroflexion       views. Impression:               -  Inflammed, slightly eroded, prolapsing internal                            hemorrhoids, several columns. These are the most                            likely source of the intermittent red rectal                            bleeding (in setting of chronic blood thinner,                            multiple co-morbid conditions)                           - Small external hemorrhoids.                           - The examination was otherwise normal on direct                            and retroflexion views.                           - No polyps or cancers. Moderate Sedation:      Not Applicable - Patient had care per Anesthesia. Recommendation:           - Patient has a contact number available for                            emergencies. The  signs and symptoms of potential                            delayed complications were discussed with the                            patient. Return to normal activities tomorrow.                            Written discharge instructions were provided to the                            patient.                           - Resume previous diet.                           - Continue present medications. OK to resume your                            blood thinner today.                           - Repeat colonoscopy in 10 years for screening  purposes.                           - My office will arrange [facility] to                            consider hemorrhoid treatment options. Procedure Code(s):        --- Professional ---                           [phone], Colonoscopy, flexible; diagnostic, including                            collection of specimen(s) by brushing or washing,                            when performed (separate procedure) Diagnosis Code(s):        --- Professional ---                           K64.8, Other hemorrhoids                           K62.5, Hemorrhage of anus and rectum CPT copyright 2019 American Medical Association. All rights reserved. The codes documented in this report are preliminary and upon coder review may  be revised to meet current compliance requirements. [name], MD 12/20/2019 9:35:31 AM This report has been signed electronically. Number of Addenda: 0

## 2019-12-20 NOTE — Interval H&P Note (Signed)
History and Physical Interval Note:  12/20/2019 8:04 AM  [name]  has presented today for surgery, with the diagnosis of rectal bleeding.  The various methods of treatment have been discussed with the patient and family. After consideration of risks, benefits and other options for treatment, the patient has consented to  Procedure(s): COLONOSCOPY WITH PROPOFOL (N/A) as a surgical intervention.  The patient's history has been reviewed, patient examined, no change in status, stable for surgery.  I have reviewed the patient's chart and labs.  Questions were answered to the patient's satisfaction.     [name]

## 2019-12-20 NOTE — Anesthesia Preprocedure Evaluation (Addendum)
Anesthesia Evaluation  Patient identified by MRN, date of birth, ID band Patient awake    Reviewed: Allergy & Precautions, NPO status , Patient's Chart, lab work & pertinent test results  Airway Mallampati: III  TM Distance: >3 FB Neck ROM: Full    Dental  (+) Teeth Intact, Missing   Pulmonary sleep apnea and Continuous Positive Airway Pressure Ventilation , COPD,  oxygen dependent,  4L [place] ATC   Pulmonary exam normal        Cardiovascular hypertension, +CHF   Rhythm:Regular Rate:Tachycardia     Neuro/Psych Depression    GI/Hepatic Neg liver ROS, GERD  Medicated and Controlled,Rectal bleeding   Endo/Other  diabetes, Well Controlled, Type 2, Oral Hypoglycemic Agents  Renal/GU   negative genitourinary   Musculoskeletal negative musculoskeletal ROS (+)   Abdominal (+)  Abdomen: soft. Bowel sounds: normal.  Peds  (+) Neurological problem Hematology  (+) anemia ,   Anesthesia Other Findings   Reproductive/Obstetrics                            Anesthesia Physical Anesthesia Plan  ASA: III  Anesthesia Plan: MAC   Post-op Pain Management:    Induction: Intravenous  PONV Risk Score and Plan: 2 and Propofol infusion  Airway Management Planned: Simple Face Mask, Natural Airway and Nasal Cannula  Additional Equipment: None  Intra-op Plan:   Post-operative Plan:   Informed Consent: I have reviewed the patients History and Physical, chart, labs and discussed the procedure including the risks, benefits and alternatives for the proposed anesthesia with the patient or authorized representative who has indicated his/her understanding and acceptance.     Dental advisory given  Plan Discussed with: CRNA  Anesthesia Plan Comments: (ECHO 06/21: 1. Pulmonary pressures are much lower than on previous studies. Given the  reduced right ventricular systolic function, this may represent   progressive right ventricular dysfunction rather than improvement.  2. Left ventricular ejection fraction, by estimation, is 65 to 70%. The  left ventricle has normal function. The left ventricle has no regional  wall motion abnormalities. Left ventricular diastolic parameters are  consistent with Grade I diastolic  dysfunction (impaired relaxation).  3. Right ventricular systolic function is moderately reduced. The right  ventricular size is moderately enlarged. There is normal pulmonary artery  systolic pressure.  4. The mitral valve is normal in structure. No evidence of mitral valve  regurgitation. No evidence of mitral stenosis.  5. The aortic valve is tricuspid. Aortic valve regurgitation is not  visualized. No aortic stenosis is present.  6. The inferior vena cava is dilated in size with <50% respiratory  variability, suggesting right atrial pressure of 15 mmHg.  Lab Results      Component                Value               Date                      WBC                      8.1                 12/13/2019                HGB  10.8 (L)            12/13/2019                HCT                      33.5 (L)            12/13/2019                MCV                      78.6                12/13/2019                PLT                      451.0 (H)           12/13/2019           Lab Results      Component                Value               Date                      NA                       140                 12/13/2019                K                        4.0                 12/13/2019                CO2                      32                  12/13/2019                GLUCOSE                  91                  12/13/2019                BUN                      12                  12/13/2019                CREATININE               0.77                12/13/2019                CALCIUM                  8.8                 12/13/2019  GFRNONAA                 >60                 08/21/2019                GFRAA                    >60                 08/21/2019          )       Anesthesia Quick Evaluation

## 2019-12-20 NOTE — Transfer of Care (Signed)
Immediate Anesthesia Transfer of Care Note  Patient: [name]  Procedure(s) Performed: Procedure(s): COLONOSCOPY WITH PROPOFOL (N/A)  Patient Location: PACU  Anesthesia Type:MAC  Level of Consciousness:  sedated, patient cooperative and responds to stimulation  Airway & Oxygen Therapy:Patient Spontanous Breathing and Patient connected to face mask oxgen  Post-op Assessment:  Report given to PACU RN and Post -op Vital signs reviewed and stable  Post vital signs:  Reviewed and stable  Last Vitals:  Vitals:   12/20/19 0836  BP: 137/63  Pulse: 90  Resp: 19  Temp: 36.6 C  SpO2: 32%    Complications: No apparent anesthesia complications

## 2019-12-20 NOTE — Telephone Encounter (Signed)
Referral made to [facility] an records faxed.

## 2019-12-20 NOTE — Telephone Encounter (Signed)
-----  Message from [name], MD sent at 12/20/2019  9:38 AM EST ----- [name], She needs referral to [facility] to consider therapy for internal/external hemorrhoids in setting of chronic blood thinners.    Thanks        - Inflammed, slightly eroded, prolapsing internal hemorrhoids, several columns. These are the most likely source of the intermittent red rectal bleeding (in setting of chronic blood thinner, multiple co-morbid conditions) - Small external hemorrhoids. - The examination was otherwise normal on direct and retroflexion views.  - No polyps or cancers.

## 2019-12-20 NOTE — Discharge Instructions (Signed)
YOU HAD AN ENDOSCOPIC PROCEDURE TODAY: Refer to the procedure report and other information in the discharge instructions given to you for any specific questions about what was found during the examination. If this information does not answer your questions, please call [place] office at [phone] to clarify.   YOU SHOULD EXPECT: Some feelings of bloating in the abdomen. Passage of more gas than usual. Walking can help get rid of the air that was put into your GI tract during the procedure and reduce the bloating. If you had a lower endoscopy (such as a colonoscopy or flexible sigmoidoscopy) you may notice spotting of blood in your stool or on the toilet paper. Some abdominal soreness may be present for a day or two, also.  DIET: Your first meal following the procedure should be a light meal and then it is ok to progress to your normal diet. A half-sandwich or bowl of soup is an example of a good first meal. Heavy or fried foods are harder to digest and may make you feel nauseous or bloated. Drink plenty of fluids but you should avoid alcoholic beverages for 24 hours. If you had a esophageal dilation, please see attached instructions for diet.    ACTIVITY: Your care partner should take you home directly after the procedure. You should plan to take it easy, moving slowly for the rest of the day. You can resume normal activity the day after the procedure however YOU SHOULD NOT DRIVE, use power tools, machinery or perform tasks that involve climbing or major physical exertion for 24 hours (because of the sedation medicines used during the test).   SYMPTOMS TO REPORT IMMEDIATELY: A gastroenterologist can be reached at any hour. Please call [phone]  for any of the following symptoms:  . Following lower endoscopy (colonoscopy, flexible sigmoidoscopy) Excessive amounts of blood in the stool  Significant tenderness, worsening of abdominal pains  Swelling of the abdomen that is new, acute  Fever of 100  or higher   FOLLOW UP:  If any biopsies were taken you will be contacted by phone or by letter within the next 1-3 weeks. Call [phone]  if you have not heard about the biopsies in 3 weeks.  Please also call with any specific questions about appointments or follow up tests.

## 2019-12-20 NOTE — Anesthesia Postprocedure Evaluation (Signed)
Anesthesia Post Note  Patient: [name]  Procedure(s) Performed: COLONOSCOPY WITH PROPOFOL (N/A )     Patient location during evaluation: Endoscopy Anesthesia Type: MAC Level of consciousness: awake and alert Pain management: pain level controlled Vital Signs Assessment: post-procedure vital signs reviewed and stable Respiratory status: spontaneous breathing, nonlabored ventilation, respiratory function stable and patient connected to nasal cannula oxygen Cardiovascular status: stable and blood pressure returned to baseline Postop Assessment: no apparent nausea or vomiting Anesthetic complications: no   No complications documented.  Last Vitals:  Vitals:   12/20/19 0950 12/20/19 1000  BP: 127/68 138/72  Pulse: 87 88  Resp: 18 20  Temp:    SpO2: 92% 91%    Last Pain:  Vitals:   12/20/19 1000  TempSrc:   PainSc: 0-No pain   Pain Goal:                   March Rummage Darrie Macmillan

## 2019-12-21 ENCOUNTER — Encounter (HOSPITAL_COMMUNITY): Payer: Self-pay | Admitting: Gastroenterology

## 2019-12-22 DIAGNOSIS — G4733 Obstructive sleep apnea (adult) (pediatric): Secondary | ICD-10-CM | POA: Diagnosis not present

## 2019-12-22 DIAGNOSIS — G4730 Sleep apnea, unspecified: Secondary | ICD-10-CM | POA: Diagnosis not present

## 2019-12-22 DIAGNOSIS — I270 Primary pulmonary hypertension: Secondary | ICD-10-CM | POA: Diagnosis not present

## 2019-12-31 ENCOUNTER — Other Ambulatory Visit: Payer: Self-pay | Admitting: Internal Medicine

## 2019-12-31 DIAGNOSIS — J069 Acute upper respiratory infection, unspecified: Secondary | ICD-10-CM

## 2020-01-02 ENCOUNTER — Telehealth: Payer: Self-pay | Admitting: Student

## 2020-01-02 NOTE — Telephone Encounter (Signed)
Referral Reason: Food Insecurity  Interventions: Successful outbound call placed to the patient Discussed resources to assist with food insecurity. Patient stated she received the resource letter in the mail with list of food pantries in the area, and that she did not have any questions at this time.   Follow up plan: No further follow up planned at this time. The patient has been provided with needed resources.

## 2020-01-05 DIAGNOSIS — G4733 Obstructive sleep apnea (adult) (pediatric): Secondary | ICD-10-CM | POA: Diagnosis not present

## 2020-01-05 DIAGNOSIS — J984 Other disorders of lung: Secondary | ICD-10-CM | POA: Diagnosis not present

## 2020-01-05 DIAGNOSIS — I5030 Unspecified diastolic (congestive) heart failure: Secondary | ICD-10-CM | POA: Diagnosis not present

## 2020-01-05 DIAGNOSIS — G4730 Sleep apnea, unspecified: Secondary | ICD-10-CM | POA: Diagnosis not present

## 2020-01-05 DIAGNOSIS — I270 Primary pulmonary hypertension: Secondary | ICD-10-CM | POA: Diagnosis not present

## 2020-01-08 ENCOUNTER — Other Ambulatory Visit: Payer: Self-pay | Admitting: Internal Medicine

## 2020-01-11 ENCOUNTER — Other Ambulatory Visit: Payer: Self-pay

## 2020-01-11 ENCOUNTER — Other Ambulatory Visit: Payer: Self-pay | Admitting: *Deleted

## 2020-01-11 DIAGNOSIS — K644 Residual hemorrhoidal skin tags: Secondary | ICD-10-CM

## 2020-01-11 DIAGNOSIS — K648 Other hemorrhoids: Secondary | ICD-10-CM

## 2020-01-11 HISTORY — DX: Residual hemorrhoidal skin tags: K64.4

## 2020-01-11 NOTE — Patient Instructions (Signed)
Visit Information  [name] was given information about Medicaid Managed Care team care coordination services as a part of their Healthy [facility] Medicaid benefit. [name] verbally consented to engagement with the [facility] Managed Care team.   For questions related to your Healthy [facility] health plan, please call: [phone] or visit the homepage here: [email or web address]  If you would like to schedule transportation through your Healthy [facility] plan, please call the following number at least 2 days in advance of your appointment: [phone]   Patient Care Plan: Heart Failure (Adult)    Problem Identified: Symtom Management     Long-Range Goal: Symptom Exacerbation Prevented or Minimized   Start Date: 12/11/2019  Expected End Date: 02/11/2020  Recent Progress: On track  Priority: High  Note:   Current Barriers:  . Chronic Disease Management support and education needs related management of multiple chronic diagnosis; CHF, COPD, OSA and T2DM. Today [name] reports having episodes of dizziness. This is a new symptom for her. She has not notified her provider.Update-Provider notified, labs collected. Hgb down to 10.8 from 13.2. Patient reports that she has not been taking iron(d/c by physician) and she has not been eating a healthy diet. [name] social connections . Does not contact provider office for questions/concerns  Nurse Case Manager Clinical Goal(s):  Marland Kitchen Over the next 30 days, patient will verbalize understanding of plan for management of new symptoms . Over the next 30 days, patient will attend all scheduled medical appointments: Pulmonology 12/13/19, Colonoscopy on 12/20/19-[facility] . Over the next 30 days, patient will demonstrate improved health management independence as evidenced by reporting new symptoms or concerns to provider-[facility]  Interventions:  . Inter-disciplinary care team collaboration (see longitudinal plan of  care) . Advised patient to call PCP and report new onset of dizziness . Reviewed medications with patient and discussed taking as directed . Discussed plans with patient for ongoing care management follow up and provided patient with direct contact information for care management team . Provided patient and/or caregiver with number for [employer], patient to call and request a pulse oximeter and scale. . Encouraged [name] to call and schedule an appointment with her PCP . Advised [name] to include more iron rich foods in her diet, education material to be mailed  Patient Goals/Self-Care Activities Over the next 30 days, patient will: - begin a heart failure diary - eat more whole grains, fruits and vegetables, lean meats and healthy fats - know when to call the doctor - track symptoms and what helps feel better or worse  -Self administers medications as prescribed  -Calls pharmacy for medication refills - Calls provider office for new concerns or questions - Incorporate more iron rich foods into your diet  Follow Up Plan: Telephone follow up appointment with Managed Medicaid care management team member scheduled for:02/13/20 @ 2:30pm    Patient Care Plan: Health Management    Problem Identified: Hemorrhoid Management     Goal: Plan of care for newly diagnosed hemorrhoids developed   Start Date: 01/11/2020  Expected End Date: 02/13/2020  This Visit's Progress: On track  Priority: Medium  Note:   Current Barriers:  . Care Coordination needs related to treatment of newly diagnosed hemorrhoids. [name] has history of rectal bleeding and was recently diagnosed with internal and external hemorrhoids while on blood thinners. A referral was placed for surgical consult in December, an appointment has not been scheduled at this time. . Transportation barriers . Cognitive Deficits  Nurse Case Manager Clinical Goal(s):  Marland Kitchen Over the next 30 days, patient will verbalize understanding of plan  for hemorrhoid treatment . Over the next 30 days, patient will attend all scheduled medical appointments: 02/05/20 @ 10:15 at [facility] for consult . Over the next 30 days, patient will work with CM team pharmacist to review medications  Interventions:  . Inter-disciplinary care team collaboration (see longitudinal plan of care) . Advised patient to avoid constipation, keep bowel movements soft to avoid irritating hemorrhoids, use a sitz bath several times a day . Provided education to patient re: hemorrhoids . Reviewed medications with patient and discussed taking as directed . Collaborated with [facility] [phone] regarding referral-They had attempted to call Faith. Barker on 12/24/19. Today we scheduled a consultation on 02/05/20 @ 10:15am . Pharmacy referral for medication review  Patient Goals/Self-Care Activities Over the next 30 days, patient will: -Self administers medications as prescribed -Attends all scheduled provider appointments -Calls pharmacy for medication refills -Calls provider office for new concerns or questions -Avoid constipation or irritation of hemorrhoids -Utilize medical transportation as needed, provided by [employer] [phone]  Follow Up Plan: Telephone follow up appointment with Managed Medicaid care management team member scheduled for:02/13/20 @ 2:30pm        Please see education materials related to iron rich foods provided as print materials.     Iron-Rich Diet  Iron is a mineral that helps your body to produce hemoglobin. Hemoglobin is a protein in red blood cells that carries oxygen to your body's tissues. Eating too little iron may cause you to feel weak and tired, and it can increase your risk of infection. Iron is naturally found in many foods, and many foods have iron added to them (iron-fortified foods). You may need to follow an iron-rich diet if you do not have enough iron in your body due to certain medical  conditions. The amount of iron that you need each day depends on your age, your sex, and any medical conditions you have. Follow instructions from your health care provider or a diet and nutrition specialist (dietitian) about how much iron you should eat each day. What are tips for following this plan? Reading food labels  Check food labels to see how many milligrams (mg) of iron are in each serving. Cooking  Cook foods in pots and pans that are made from iron.  Take these steps to make it easier for your body to absorb iron from certain foods: ? Soak beans overnight before cooking. ? Soak whole grains overnight and drain them before using. ? Ferment flours before baking, such as by using yeast in bread dough. Meal planning  When you eat foods that contain iron, you should eat them with foods that are high in vitamin C. These include oranges, peppers, tomatoes, potatoes, and mango. Vitamin C helps your body to absorb iron. General information  Take iron supplements only as told by your health care provider. An overdose of iron can be life-threatening. If you were prescribed iron supplements, take them with orange juice or a vitamin C supplement.  When you eat iron-fortified foods or take an iron supplement, you should also eat foods that naturally contain iron, such as meat, poultry, and fish. Eating naturally iron-rich foods helps your body to absorb the iron that is added to other foods or contained in a supplement.  Certain foods and drinks prevent your body from absorbing iron properly. Avoid eating these foods in the same meal as  iron-rich foods or with iron supplements. These foods include: ? Coffee, black tea, and red wine. ? Milk, dairy products, and foods that are high in calcium. ? Beans and soybeans. ? Whole grains. What foods should I eat? Fruits Prunes. Raisins. Eat fruits high in vitamin C, such as oranges, grapefruits, and strawberries, alongside iron-rich  foods. Vegetables Spinach (cooked). Green peas. Broccoli. Fermented vegetables. Eat vegetables high in vitamin C, such as leafy greens, potatoes, bell peppers, and tomatoes, alongside iron-rich foods. Grains Iron-fortified breakfast cereal. Iron-fortified whole-wheat bread. Enriched rice. Sprouted grains. Meats and other proteins Beef liver. Oysters. Beef. Shrimp. [place]. Chicken. [place]. Sardines. Chickpeas. Nuts. Tofu. Pumpkin seeds. Beverages Tomato juice. Fresh orange juice. Prune juice. Hibiscus tea. Fortified instant breakfast shakes. Sweets and desserts Blackstrap molasses. Seasonings and condiments Tahini. Fermented soy sauce. Other foods Wheat germ. The items listed above may not be a complete list of recommended foods and beverages. Contact a dietitian for more information. What foods should I avoid? Grains Whole grains. Bran cereal. Bran flour. Oats. Meats and other proteins Soybeans. Products made from soy protein. Black beans. Lentils. Mung beans. Split peas. Dairy Milk. Cream. Cheese. Yogurt. Cottage cheese. Beverages Coffee. Black tea. Red wine. Sweets and desserts Cocoa. Chocolate. Ice cream. Other foods Basil. Oregano. Large amounts of parsley. The items listed above may not be a complete list of foods and beverages to avoid. Contact a dietitian for more information. Summary  Iron is a mineral that helps your body to produce hemoglobin. Hemoglobin is a protein in red blood cells that carries oxygen to your body's tissues.  Iron is naturally found in many foods, and many foods have iron added to them (iron-fortified foods).  When you eat foods that contain iron, you should eat them with foods that are high in vitamin C. Vitamin C helps your body to absorb iron.  Certain foods and drinks prevent your body from absorbing iron properly, such as whole grains and dairy products. You should avoid eating these foods in the same meal as iron-rich foods or with iron  supplements. This information is not intended to replace advice given to you by your health care provider. Make sure you discuss any questions you have with your health care provider. Document Revised: 12/03/2016 Document Reviewed: 11/16/2016 Elsevier Patient Education  [employer].   The patient verbalized understanding of instructions provided today and agreed to receive a mailed copy of patient instruction and/or educational materials.  Telephone follow up appointment with Managed Medicaid care management team member scheduled for:02/13/20 @ 2:30pm  Melissa Montane, RN

## 2020-01-11 NOTE — Patient Outreach (Signed)
Medicaid Managed Care   Nurse Care Manager Note  01/11/2020 Name:  RENAY Barker MRN:  [id number] DOB:  1960-09-15  [name] is an 60 y.o. year old female who is a primary patient of [name], MD.  The [facility] Managed Care Coordination team was consulted for assistance with:    CHF  [name] was given information about Medicaid Managed Care Coordination team services today. [name] agreed to services and verbal consent obtained.  Engaged with patient by telephone for follow up visit in response to provider referral for case management and/or care coordination services.   Assessments/Interventions:  Review of past medical history, allergies, medications, health status, including review of consultants reports, laboratory and other test data, was performed as part of comprehensive evaluation and provision of chronic care management services.  SDOH (Social Determinants of Health) assessments and interventions performed:   Care Plan  Allergies  Allergen Reactions  . Aspirin Swelling    REACTION: airway swelling  . Codeine Other (See Comments)    REACTION: tingling in lips and hard breathing - had reaction at dentist - states "I can't take certain kinds of codeine" - happened maybe 10 yr ago  . Lisinopril Cough  . Sulfonamide Derivatives Swelling    REACTION: airway swelling  . Latex Rash    Medications Reviewed Today    Reviewed by Melissa Montane, RN (Registered Nurse) on 01/11/20 at 1445  Med List Status: <None>  Medication Order Taking? Sig Documenting Provider Last Dose Status Informant  ACCU-CHEK AVIVA PLUS test strip [id number] No USE 1 STRIP TO CHECK GLUCOSE ONCE DAILY  Patient not taking: No sig reported   [name], MD Not Taking Active Self  acetaminophen (TYLENOL) 500 MG tablet [id number] Yes Take 1 tablet (500 mg total) by mouth every 6 (six) hours as needed.  Patient taking differently: Take 500 mg by mouth every 6 (six) hours as needed  (pain).   [name], MD Taking Active   cetirizine [facility] ALLERGY RELIEF, CETIRIZINE,) 10 MG tablet [id number] Yes Take 1 tablet (10 mg total) by mouth daily. [name], [name], DO Taking Active   clobetasol ointment (TEMOVATE) 0.05 % [id number] Yes Apply 1 application topically as directed. Apply to vulva: qhs x 4wks & then every other night x 4wks, then 2x/week  Patient taking differently: Apply 1 application topically at bedtime.   [name], MD Taking Active   cycloSPORINE (RESTASIS) 0.05 % ophthalmic emulsion [id number] Yes Place 1 drop into both eyes 2 (two) times daily as needed (dry eyes). [provider] Taking Active Self  diphenhydrAMINE (BENADRYL) 25 MG tablet [id number] Yes Take 50-100 mg by mouth 2 (two) times daily as needed for itching. [provider] Taking Active Self  fluticasone (FLONASE) 50 MCG/ACT nasal spray [id number] Yes Place 1 spray into both nostrils daily.  Patient taking differently: Place 1 spray into both nostrils daily as needed for allergies.   [name], MD Taking Active   gabapentin (NEURONTIN) 300 MG capsule [id number] Yes TAKE 2 CAPSULES BY MOUTH THREE TIMES DAILY  Patient taking differently: Take 600 mg by mouth 3 (three) times daily.   [name], MD Taking Active   metFORMIN (GLUCOPHAGE) 500 MG tablet [id number] Yes TAKE 1/2 (ONE-HALF) TABLET BY MOUTH WITH BREAKFAST  Patient taking differently: Take 250 mg by mouth daily with breakfast.   Oda Kilts, MD Taking Active   Na Sulfate-K Sulfate-Mg Sulf [facility] BOWEL PREP KIT) 17.5-3.13-1.6 GM/177ML Bailey Mech [id number]  No Take 1 kit by mouth as directed.  Patient not taking: Reported on 01/11/2020   [name], MD Not Taking Active Self  OXYGEN [id number] Yes Inhale 4 L into the lungs continuous. [provider] Taking Active Self  pantoprazole (PROTONIX) 20 MG tablet [id number] Yes Take 1 tablet (20 mg total) by mouth 2 (two) times daily. [name], MD Taking Active Self  potassium chloride SA (KLOR-CON) 20 MEQ tablet [id number] Yes TAKE 2 TABLETS BY MOUTH THREE TIMES DAILY  Patient taking differently: Take 40 mEq by mouth 2 (two) times daily.   [name], MD Taking Active            Med Note Kenton Kingfisher, Earley Favor   Fri Dec 14, 2019  2:20 PM)    pravastatin (PRAVACHOL) 20 MG tablet [id number] Yes Take 1 tablet (20 mg total) by mouth daily. [name], MD Taking Active Self  rivaroxaban (XARELTO) 20 MG TABS tablet [id number] Yes TAKE 1 TABLET BY MOUTH ONCE DAILY WITH SUPPER  Patient taking differently: Take 20 mg by mouth daily with supper.   [name], MD Taking Active   sildenafil (REVATIO) 20 MG tablet [id number] Yes TAKE 1 TABLET THREE TIMES A DAY ( GENERIC FOR REVATIO ) Bensimhon, [name], MD Taking Active   spironolactone (ALDACTONE) 25 MG tablet [id number] Yes Take 1/2 (one-half) tablet by mouth once daily  Patient taking differently: Take 12.5 mg by mouth daily.   [name], MD Taking Active   torsemide (DEMADEX) 20 MG tablet [id number] Yes TAKE 4 TABLETS BY MOUTH TWICE DAILY [name], MD Taking Active   treprostinil (REMODULIN) 5 MG/ML SOLN injection [id number] Yes See admin instructions. As of 08/09/2019: Add 7 ml of Remodulin to cassette and 93 ml of sterile diluent for Remodulin to cassetet for a total volume of 100 ml to make a concentration of 350,000 ng per ml.  Infuse via a CADD pump intravenously at a rate of 40 ml per 24 hours. Based on a dosing weight of 120 kg, the dose is 81 ng per kg per min.  Once opened, discard Remodulin vial after 30 days.  Sterile Diluent for Remodulin vials are single use only. Once mixed, discard cassette after 48 hours. [provider] Taking Active Self           Med Note Ralene Cork Dec 17, 2019 12:50 PM) Continuous infusion 12/17/19  vitamin B-12 (CYANOCOBALAMIN) 500 MCG tablet [id number] Yes Take 500 mcg by mouth daily. [provider] Taking Active Self          Patient Active Problem List   Diagnosis Date Noted  . Internal hemorrhoid 01/11/2020  . External hemorrhoid 01/11/2020  . Fibroid, uterine 11/07/2019  . Vagina bleeding 10/15/2019  . 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 Bleeding from the genitourinary system 09/13/2019  . Gross hematuria 09/13/2019  . Chronic venous stasis 05/07/2019  . Urinary incontinence 12/12/2018  . 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 Acute on chronic respiratory failure ([place]) 06/15/2018  . Right ovarian cyst 05/19/2017  . Peripheral neuropathy ([place]) 09/27/2014  . Healthcare maintenance 09/27/2014  . Acute on chronic diastolic (congestive) heart failure ([place]) 06/25/2013  . Atrial flutter ([place]) 08/17/2012  . Pulmonary hypertension ([place]) 08/16/2012  . Right heart failure, NYHA class 3 ([place]) 03/15/2012  . Allergic rhinitis 11/16/2011  . Diabetes ([place]) 10/15/2010  . Hyperlipidemia 10/15/2010  . Obesity 10/14/2010  .  Obstructive sleep apnea 02/21/2009  . GERD 11/29/2005    Conditions to be addressed/monitored per PCP order:  CHF  Patient Care Plan: Heart Failure (Adult)    Problem Identified: [place] Management     Long-Range Goal: Symptom Exacerbation Prevented or Minimized   Start Date: 12/11/2019  Expected End Date: 02/11/2020  Recent Progress: On track  Priority: High  Note:   Current Barriers:  . Chronic Disease Management support and education needs related management of multiple chronic diagnosis; CHF, COPD, OSA and T2DM. Today Ms Pinch reports having episodes of dizziness. This is a new symptom for her. She has not notified her provider.Update-Provider notified, labs collected. Hgb down to 10.8 from 13.2. Patient reports that she has not been taking iron(d/c by physician) and she has not been eating a healthy diet. [place] social connections . Does not contact provider office for questions/concerns  Nurse Case  Manager Clinical Goal(s):  Marland Kitchen Over the next 30 days, patient will verbalize understanding of plan for management of new symptoms . Over the next 30 days, patient will attend all scheduled medical appointments: Pulmonology 12/13/19, Colonoscopy on 12/20/19-[facility] . Over the next 30 days, patient will demonstrate improved health management independence as evidenced by reporting new symptoms or concerns to provider-[facility]  Interventions:  . Inter-disciplinary care team collaboration (see longitudinal plan of care) . Advised patient to call PCP and report new onset of dizziness . Reviewed medications with patient and discussed taking as directed . Discussed plans with patient for ongoing care management follow up and provided patient with direct contact information for care management team . Provided patient and/or caregiver with number for [employer], patient to call and request a pulse oximeter and scale. . Encouraged Ms Finfrock to call and schedule an appointment with her PCP . Advised Ms Greenhouse to include more iron rich foods in her diet, education material to be mailed  Patient Goals/Self-Care Activities Over the next 30 days, patient will: - begin a heart failure diary - eat more whole grains, fruits and vegetables, lean meats and healthy fats - know when to call the doctor - track symptoms and what helps feel better or worse  -Self administers medications as prescribed  -Calls pharmacy for medication refills - Calls provider office for new concerns or questions - Incorporate more iron rich foods into your diet  Follow Up Plan: Telephone follow up appointment with Managed Medicaid care management team member scheduled for:02/13/20 @ 2:30pm    Patient Care Plan: Health Management    Problem Identified: Hemorrhoid Management     Goal: Plan of care for newly diagnosed hemorrhoids developed   Start Date: 01/11/2020  Expected End Date: 02/13/2020  This Visit's Progress: On track  Priority: Medium   Note:   Current Barriers:  . Care Coordination needs related to treatment of newly diagnosed hemorrhoids. Ms Zeimet has history of rectal bleeding and was recently diagnosed with internal and external hemorrhoids while on blood thinners. A referral was placed for surgical consult in December, an appointment has not been scheduled at this time. . Transportation barriers . Cognitive Deficits  Nurse Case Manager Clinical Goal(s):  Marland Kitchen Over the next 30 days, patient will verbalize understanding of plan for hemorrhoid treatment . Over the next 30 days, patient will attend all scheduled medical appointments: 02/05/20 @ 10:15 at [facility] for consult . Over the next 30 days, patient will work with CM team pharmacist to review medications  Interventions:  . Inter-disciplinary care team collaboration (see  longitudinal plan of care) . Advised patient to avoid constipation, keep bowel movements soft to avoid irritating hemorrhoids, use a sitz bath several times a day . Provided education to patient re: hemorrhoids . Reviewed medications with patient and discussed taking as directed . Collaborated with [facility] [phone] regarding referral-They had attempted to call Ms. Shuey on 12/24/19. Today we scheduled a consultation on 02/05/20 @ 10:15am . Pharmacy referral for medication review  Patient Goals/Self-Care Activities Over the next 30 days, patient will: -Self administers medications as prescribed -Attends all scheduled provider appointments -Calls pharmacy for medication refills -Calls provider office for new concerns or questions -Avoid constipation or irritation of hemorrhoids -Utilize medical transportation as needed, provided by [employer] [phone]  Follow Up Plan: Telephone follow up appointment with Managed Medicaid care management team member scheduled for:02/13/20 @ 2:30pm        Follow Up:  Patient agrees to Care Plan and Follow-up.  Plan: The  Managed Medicaid care management team will reach out to the patient again over the next 30 days.  Date/time of next scheduled RN care management/care coordination outreach: 02/13/20 @ 2:30pm  [name] RN, BSN [place] RN Care Coordinator

## 2020-01-14 ENCOUNTER — Telehealth: Payer: Self-pay | Admitting: Student

## 2020-01-14 NOTE — Telephone Encounter (Signed)
I attempted to reach [name] today to get her scheduled for a phone appt with the [facility] Pharmacist. I left my name and number for her to return my call. I will reach out again in the next7-14 days if I do not hear back from her.

## 2020-01-16 DIAGNOSIS — N3081 Other cystitis with hematuria: Secondary | ICD-10-CM | POA: Diagnosis not present

## 2020-01-16 DIAGNOSIS — R310 Gross hematuria: Secondary | ICD-10-CM | POA: Diagnosis not present

## 2020-01-26 ENCOUNTER — Other Ambulatory Visit: Payer: Self-pay | Admitting: Student

## 2020-02-04 ENCOUNTER — Other Ambulatory Visit: Payer: Self-pay | Admitting: Student

## 2020-02-05 DIAGNOSIS — K649 Unspecified hemorrhoids: Secondary | ICD-10-CM | POA: Diagnosis not present

## 2020-02-05 DIAGNOSIS — G4730 Sleep apnea, unspecified: Secondary | ICD-10-CM | POA: Diagnosis not present

## 2020-02-05 DIAGNOSIS — J984 Other disorders of lung: Secondary | ICD-10-CM | POA: Diagnosis not present

## 2020-02-05 DIAGNOSIS — I5030 Unspecified diastolic (congestive) heart failure: Secondary | ICD-10-CM | POA: Diagnosis not present

## 2020-02-05 DIAGNOSIS — G4733 Obstructive sleep apnea (adult) (pediatric): Secondary | ICD-10-CM | POA: Diagnosis not present

## 2020-02-05 DIAGNOSIS — I270 Primary pulmonary hypertension: Secondary | ICD-10-CM | POA: Diagnosis not present

## 2020-02-13 ENCOUNTER — Other Ambulatory Visit: Payer: Self-pay

## 2020-02-13 ENCOUNTER — Other Ambulatory Visit: Payer: Self-pay | Admitting: *Deleted

## 2020-02-13 DIAGNOSIS — K648 Other hemorrhoids: Secondary | ICD-10-CM

## 2020-02-13 DIAGNOSIS — K644 Residual hemorrhoidal skin tags: Secondary | ICD-10-CM

## 2020-02-13 NOTE — Patient Instructions (Signed)
Visit Information  Ms. Rockers was given information about Medicaid Managed Care team care coordination services as a part of their Healthy [facility] Medicaid benefit. [name] verbally consented to engagement with the [facility] Managed Care team.   For questions related to your Healthy [facility] health plan, please call: [phone] or visit the homepage here: [email or web address]  If you would like to schedule transportation through your Healthy [facility] plan, please call the following number at least 2 days in advance of your appointment: [phone]  [name] - following are the goals we discussed in your visit today:  Goals Addressed            This Visit's Progress   . Track and Manage Fluids and Swelling-Heart Failure       Timeframe:  Long-Range Goal Priority:  High Start Date:   12/11/19                          Expected End Date: 02/11/20                    Follow Up Date 01/11/20   - call office if I gain more than 2 pounds in one day or 5 pounds in one week - keep legs up while sitting - track weight in diary - use salt in moderation - watch for swelling in feet, ankles and legs every day - weigh myself daily    Why is this important?    It is important to check your weight daily and watch how much salt and liquids you have.   It will help you to manage your heart failure.        . Track and Manage Symptoms-Heart Failure       Timeframe:  Long-Range Goal Priority:  High Start Date:   12/11/19                          Expected End Date:   02/11/20                Follow Up Date 01/11/19   - begin a heart failure diary - eat more whole grains, fruits and vegetables, lean meats and healthy fats - know when to call the doctor - track symptoms and what helps feel better or worse  - increase activity, doing light exercises 2-3 times a week   Why is this important?    You will be able to handle your symptoms better if you  keep track of them.   Making some simple changes to your lifestyle will help.   Eating healthy is one thing you can do to take good care of yourself.           Please see education materials related to hemorrhoids and exercise provided as print materials.     Hemorrhoids Hemorrhoids are swollen veins that may develop:  In the butt (rectum). These are called internal hemorrhoids.  Around the opening of the butt (anus). These are called external hemorrhoids. Hemorrhoids can cause pain, itching, or bleeding. Most of the time, they do not cause serious problems. They usually get better with diet changes, lifestyle changes, and other home treatments. What are the causes? This condition may be caused by:  Having trouble pooping (constipation).  Pushing hard (straining) to poop.  Watery poop (diarrhea).  Pregnancy.  Being very overweight (obese).  Sitting for long periods of time.  Heavy  lifting or other activity that causes you to strain.  Anal sex.  Riding a bike for a long period of time. What are the signs or symptoms? Symptoms of this condition include:  Pain.  Itching or soreness in the butt.  Bleeding from the butt.  Leaking poop.  Swelling in the area.  One or more lumps around the opening of your butt. How is this diagnosed? A doctor can often diagnose this condition by looking at the affected area. The doctor may also:  Do an exam that involves feeling the area with a gloved hand (digital rectal exam).  Examine the area inside your butt using a small tube (anoscope).  Order blood tests. This may be done if you have lost a lot of blood.  Have you get a test that involves looking inside the colon using a flexible tube with a camera on the end (sigmoidoscopy or colonoscopy). How is this treated? This condition can usually be treated at home. Your doctor may tell you to change what you eat, make lifestyle changes, or try home treatments. If these do  not help, procedures can be done to remove the hemorrhoids or make them smaller. These may involve:  Placing rubber bands at the base of the hemorrhoids to cut off their blood supply.  Injecting medicine into the hemorrhoids to shrink them.  Shining a type of light energy onto the hemorrhoids to cause them to fall off.  Doing surgery to remove the hemorrhoids or cut off their blood supply. Follow these instructions at home: Eating and drinking  Eat foods that have a lot of fiber in them. These include whole grains, beans, nuts, fruits, and vegetables.  Ask your doctor about taking products that have added fiber (fibersupplements).  Reduce the amount of fat in your diet. You can do this by: ? Eating low-fat dairy products. ? Eating less red meat. ? Avoiding processed foods.  Drink enough fluid to keep your pee (urine) pale yellow.   Managing pain and swelling  Take a warm-water bath (sitz bath) for 20 minutes to ease pain. Do this 3-4 times a day. You may do this in a bathtub or using a portable sitz bath that fits over the toilet.  If told, put ice on the painful area. It may be helpful to use ice between your warm baths. ? Put ice in a plastic bag. ? Place a towel between your skin and the bag. ? Leave the ice on for 20 minutes, 2-3 times a day.   General instructions  Take over-the-counter and prescription medicines only as told by your doctor. ? Medicated creams and medicines may be used as told.  Exercise often. Ask your doctor how much and what kind of exercise is best for you.  Go to the bathroom when you have the urge to poop. Do not wait.  Avoid pushing too hard when you poop.  Keep your butt dry and clean. Use wet toilet paper or moist towelettes after pooping.  Do not sit on the toilet for a long time.  Keep all follow-up visits as told by your doctor. This is important. Contact a doctor if you:  Have pain and swelling that do not get better with treatment or  medicine.  Have trouble pooping.  Cannot poop.  Have pain or swelling outside the area of the hemorrhoids. Get help right away if you have:  Bleeding that will not stop. Summary  Hemorrhoids are swollen veins in the butt or around the  opening of the butt.  They can cause pain, itching, or bleeding.  Eat foods that have a lot of fiber in them. These include whole grains, beans, nuts, fruits, and vegetables.  Take a warm-water bath (sitz bath) for 20 minutes to ease pain. Do this 3-4 times a day. This information is not intended to replace advice given to you by your health care provider. Make sure you discuss any questions you have with your health care provider. Document Revised: 12/29/2017 Document Reviewed: 05/12/2017 Elsevier Patient Education  [place].    Exercises to do While Sitting  Exercises that you do while sitting (chair exercises) can give you many of the same benefits as full exercise. Benefits include strengthening your heart, burning calories, and keeping muscles and joints healthy. Exercise can also improve your mood and help with depression and anxiety. You may benefit from chair exercises if you are unable to do standing exercises because of:  Diabetic foot pain.  Obesity.  Illness.  Arthritis.  Recovery from surgery or injury.  Breathing problems.  Balance problems.  Another type of disability. Before starting chair exercises, check with your health care provider or a physical therapist to find out how much exercise you can tolerate and which exercises are safe for you. If your health care provider approves:  Start out slowly and build up over time. Aim to work up to about 10-20 minutes for each exercise session.  Make exercise part of your daily routine.  Drink water when you exercise. Do not wait until you are thirsty. Drink every 10-15 minutes.  Stop exercising right away if you have pain, nausea, shortness of breath, or  dizziness.  If you are exercising in a wheelchair, make sure to lock the wheels.  Ask your health care provider whether you can do tai chi or yoga. Many positions in these mind-body exercises can be modified to do while seated. Warm-up Before starting other exercises: 1. Sit up as straight as you can. Have your knees bent at 90 degrees, which is the shape of the capital letter "L." Keep your feet flat on the floor. 2. Sit at the front edge of your chair, if you can. 3. Pull in (tighten) the muscles in your abdomen and stretch your spine and neck as straight as you can. Hold this position for a few minutes. 4. Breathe in and out evenly. Try to concentrate on your breathing, and relax your mind. Stretching Exercise A: Arm stretch 1. Hold your arms out straight in front of your body. 2. Bend your hands at the wrist with your fingers pointing up, as if signaling someone to stop. Notice the slight tension in your forearms as you hold the position. 3. Keeping your arms out and your hands bent, rotate your hands outward as far as you can and hold this stretch. Aim to have your thumbs pointing up and your pinkie fingers pointing down. Slowly repeat arm stretches for one minute as tolerated. Exercise B: Leg stretch 1. If you can move your legs, try to "draw" letters on the floor with the toes of your foot. Write your name with one foot. 2. Write your name with the toes of your other foot. Slowly repeat the movements for one minute as tolerated. Exercise C: Reach for the sky 1. Reach your hands as far over your head as you can to stretch your spine. 2. Move your hands and arms as if you are climbing a rope. Slowly repeat the movements for one  minute as tolerated. Range of motion exercises Exercise A: Shoulder roll 1. Let your arms hang loosely at your sides. 2. Lift just your shoulders up toward your ears, then let them relax back down. 3. When your shoulders feel loose, rotate your shoulders in  backward and forward circles. Do shoulder rolls slowly for one minute as tolerated. Exercise B: March in place 1. As if you are marching, pump your arms and lift your legs up and down. Lift your knees as high as you can. ? If you are unable to lift your knees, just pump your arms and move your ankles and feet up and down. March in place for one minute as tolerated. Exercise C: Seated jumping jacks 1. Let your arms hang down straight. 2. Keeping your arms straight, lift them up over your head. Aim to point your fingers to the ceiling. 3. While you lift your arms, straighten your legs and slide your heels along the floor to your sides, as wide as you can. 4. As you bring your arms back down to your sides, slide your legs back together. ? If you are unable to use your legs, just move your arms. Slowly repeat seated jumping jacks for one minute as tolerated. Strengthening exercises Exercise A: Shoulder squeeze 1. Hold your arms straight out from your body to your sides, with your elbows bent and your fists pointed at the ceiling. 2. Keeping your arms in the bent position, move them forward so your elbows and forearms meet in front of your face. 3. Open your arms back out as wide as you can with your elbows still bent, until you feel your shoulder blades squeezing together. Hold for 5 seconds. Slowly repeat the movements forward and backward for one minute as tolerated. Contact a health care provider if you:  Had to stop exercising due to any of the following: ? Pain. ? Nausea. ? Shortness of breath. ? Dizziness. ? Fatigue.  Have significant pain or soreness after exercising. Get help right away if you have:  Chest pain.  Difficulty breathing. These symptoms may represent a serious problem that is an emergency. Do not wait to see if the symptoms will go away. Get medical help right away. Call your local emergency services (911 in the [place] Do not drive yourself to the hospital. This  information is not intended to replace advice given to you by your health care provider. Make sure you discuss any questions you have with your health care provider. Document Revised: 04/19/2019 Document Reviewed: 04/19/2019 Elsevier Patient Education  [place].   The patient verbalized understanding of instructions provided today and agreed to receive a mailed copy of patient instruction and/or educational materials.  Telephone follow up appointment with Managed Medicaid care management team member scheduled for:04/15/20 @ 2:30  Melissa Montane, RN  Following is a copy of your plan of care:  Patient Care Plan: Heart Failure (Adult)    Problem Identified: Symtom Management     Long-Range Goal: Symptom Exacerbation Prevented or Minimized   Start Date: 12/11/2019  Expected End Date: 04/15/2020  Recent Progress: On track  Priority: High  Note:   Current Barriers:  . Chronic Disease Management support and education needs related management of multiple chronic diagnosis; CHF, COPD, OSA and T2DM. Today Ms Dina reports having episodes of dizziness. This is a new symptom for her. She has not notified her provider. Provider notified, labs collected. Hgb down to 10.8 from 13.2. Patient reports that she has not  been taking iron(d/c by physician) and she has not been eating a healthy diet. Update-Dizziness has improved with resting and healthy diet . Lacks social connections . Does not contact provider office for questions/concerns  Nurse Case Manager Clinical Goal(s):  Marland Kitchen Over the next 30 days, patient will verbalize understanding of plan for management of new symptoms . Over the next 30 days, patient will attend all scheduled medical appointments: Pulmonology 02/26/20 and PCP appointment to be scheduled . Over the next 30 days, patient will demonstrate improved health management independence as evidenced by reporting new symptoms or concerns to provider-Met  Interventions:   . Inter-disciplinary care team collaboration (see longitudinal plan of care) . Advised patient to call PCP and report new onset of dizziness . Reviewed medications with patient and discussed taking as directed . Discussed plans with patient for ongoing care management follow up and provided patient with direct contact information for care management team . Provided patient and/or caregiver with number for [employer], patient to call and request a pulse oximeter and scale. . Encouraged Ms Gemmill to call and schedule an appointment with her PCP . Advised Ms Cade to include more iron rich foods in her diet, education material to be mailed  Patient Goals/Self-Care Activities Over the next 30 days, patient will: - begin a heart failure diary - eat more whole grains, fruits and vegetables, lean meats and healthy fats - know when to call the doctor - track symptoms and what helps feel better or worse  -Self administers medications as prescribed  -Calls pharmacy for medication refills - Calls provider office for new concerns or questions - Incorporate more iron rich foods into your diet - increase activity, doing light exercises 2-3 times a week  Follow Up Plan: Telephone follow up appointment with Managed Medicaid care management team member scheduled for:04/15/20 @ 2:30pm    Patient Care Plan: Health Management    Problem Identified: Hemorrhoid Management     Goal: Plan of care for newly diagnosed hemorrhoids developed Completed 02/13/2020  Start Date: 01/11/2020  Expected End Date: 02/13/2020  Recent Progress: On track  Priority: Medium  Note:   Current Barriers:  . Care Coordination needs related to treatment of newly diagnosed hemorrhoids. Ms Gutknecht has history of rectal bleeding and was recently diagnosed with internal and external hemorrhoids while on blood thinners. A referral was placed for surgical consult in December, an appointment has not been scheduled at this  time. . Transportation barriers . Cognitive Deficits  Nurse Case Manager Clinical Goal(s):  Marland Kitchen Over the next 30 days, patient will verbalize understanding of plan for hemorrhoid treatment-Met-Due to Ms Cessna's health conditions, the plan is to improve her diet, increasing fiber, green leafy vegetables, to avoid irritation to hemorrhoids . Over the next 30 days, patient will attend all scheduled medical appointments: 02/05/20 @ 10:15 at [facility] for consult-Met . Over the next 30 days, patient will work with CM team pharmacist to review medications-Met  Interventions:  . Inter-disciplinary care team collaboration (see longitudinal plan of care) . Advised patient to avoid constipation, keep bowel movements soft to avoid irritating hemorrhoids, use a sitz bath several times a day . Provided education to patient re: hemorrhoids . Reviewed medications with patient and discussed taking as directed . Collaborated with [facility] [phone] regarding referral-They had attempted to call Ms. Rawles on 12/24/19. Today we scheduled a consultation on 02/05/20 @ 10:15am . Pharmacy referral for medication review  Patient Goals/Self-Care Activities Over the next  30 days, patient will: -Self administers medications as prescribed -Attends all scheduled provider appointments -Calls pharmacy for medication refills -Calls provider office for new concerns or questions -Avoid constipation or irritation of hemorrhoids -Utilize medical transportation as needed, provided by [facility] [phone]  Follow Up Plan:  No further follow up required

## 2020-02-13 NOTE — Patient Outreach (Signed)
Medicaid Managed Care   Nurse Care Manager Note  02/13/2020 Name:  [name] MRN:  [id number] DOB:  Nov 21, 1960  [name] is an 60 y.o. year old female who is a primary patient of Faith Mulligan, MD.  [facility] Managed Care Coordination team was consulted for assistance with:    CHF  [name] was given information about Medicaid Managed Care Coordination team services today. [name] agreed to services and verbal consent obtained.  Engaged with patient by telephone for follow up visit in response to provider referral for case management and/or care coordination services.   Assessments/Interventions:  Review of past medical history, allergies, medications, health status, including review of consultants reports, laboratory and other test data, was performed as part of comprehensive evaluation and provision of chronic care management services.  SDOH (Social Determinants of Health) assessments and interventions performed:   Care Plan  Allergies  Allergen Reactions  . Aspirin Swelling    REACTION: airway swelling  . Codeine Other (See Comments)    REACTION: tingling in lips and hard breathing - had reaction at dentist - states "I can't take certain kinds of codeine" - happened maybe 10 yr ago  . Lisinopril Cough  . Sulfonamide Derivatives Swelling    REACTION: airway swelling  . Latex Rash    Medications Reviewed Today    Reviewed by Faith Montane, RN (Registered Nurse) on 02/13/20 at 1456  Med List Status: <None>  Medication Order Taking? Sig Documenting Provider Last Dose Status Informant  ACCU-CHEK AVIVA PLUS test strip [id number] No USE 1 STRIP TO CHECK GLUCOSE ONCE DAILY  Patient not taking: No sig reported   Faith Roan, MD Not Taking Active Self  acetaminophen (TYLENOL) 500 MG tablet [id number] Yes Take 1 tablet (500 mg total) by mouth every 6 (six) hours as needed.  Patient taking differently: Take 500 mg by mouth every 6 (six) hours as needed  (pain).   Faith Estelle, MD Taking Active   cetirizine [facility] ALLERGY RELIEF, CETIRIZINE,) 10 MG tablet [id number] Yes Take 1 tablet (10 mg total) by mouth daily. Barker, Faith A, DO Taking Active   clobetasol ointment (TEMOVATE) 0.05 % [id number] Yes Apply 1 application topically as directed. Apply to vulva: qhs x 4wks & then every other night x 4wks, then 2x/week  Patient taking differently: Apply 1 application topically at bedtime.   Faith Halim, MD Taking Active   cycloSPORINE (RESTASIS) 0.05 % ophthalmic emulsion [id number] Yes Place 1 drop into both eyes 2 (two) times daily as needed (dry eyes). [provider] Taking Active Self  diphenhydrAMINE (BENADRYL) 25 MG tablet [id number] Yes Take 50-100 mg by mouth 2 (two) times daily as needed for itching. [provider] Taking Active Self  FIBER PO [id number] Yes Take by mouth. [provider] Taking Active   fluticasone (FLONASE) 50 MCG/ACT nasal spray [id number]  Place 1 spray into both nostrils daily.  Patient taking differently: Place 1 spray into both nostrils daily as needed for allergies.   Faith Noble, MD  Active   gabapentin (NEURONTIN) 300 MG capsule [id number] Yes TAKE 2 CAPSULES BY MOUTH THREE TIMES DAILY  Patient taking differently: Take 600 mg by mouth 3 (three) times daily.   Faith Noble, MD Taking Active   metFORMIN (GLUCOPHAGE) 500 MG tablet [id number] Yes TAKE 1/2 (ONE-HALF) TABLET BY MOUTH WITH BREAKFAST  Patient taking differently: Take 250 mg by mouth daily with breakfast.   Faith Kilts, MD Taking Active  Na Sulfate-K Sulfate-Mg Sulf (SUPREP BOWEL PREP KIT) 17.5-3.13-1.6 GM/177ML SOLN [id number] No Take 1 kit by mouth as directed.  Patient not taking: No sig reported   Faith Banister, MD Not Taking Active   OXYGEN [id number] Yes Inhale 4 L into the lungs continuous. [provider] Taking Active Self  pantoprazole (PROTONIX) 20 MG tablet [id number] Yes Take 1 tablet  (20 mg total) by mouth 2 (two) times daily. Faith Filler, MD Taking Active Self  potassium chloride SA (KLOR-CON) 20 MEQ tablet [id number] Yes TAKE 2 TABLETS BY MOUTH THREE TIMES DAILY  Patient taking differently: Take 40 mEq by mouth 2 (two) times daily.   Faith Axon, MD Taking Active            Med Note [name], Earley Favor   Fri Dec 14, 2019  2:20 PM)    pravastatin (PRAVACHOL) 20 MG tablet [id number] Yes Take 1 tablet (20 mg total) by mouth daily. Faith Axon, MD Taking Active Self  rivaroxaban (XARELTO) 20 MG TABS tablet [id number] Yes TAKE 1 TABLET BY MOUTH ONCE DAILY WITH SUPPER  Patient taking differently: Take 20 mg by mouth daily with supper.   Faith Mulligan, MD Taking Active   sildenafil (REVATIO) 20 MG tablet [id number] Yes TAKE 1 TABLET THREE TIMES A DAY ( GENERIC FOR REVATIO ) Barker, Faith Pascal, MD Taking Active   spironolactone (ALDACTONE) 25 MG tablet [id number] Yes Take 1/2 (one-half) tablet by mouth once daily  Patient taking differently: Take 12.5 mg by mouth daily.   Faith Axon, MD Taking Active   torsemide (DEMADEX) 20 MG tablet [id number] Yes TAKE 4 TABLETS BY MOUTH TWICE DAILY Faith Axon, MD Taking Active   treprostinil (REMODULIN) 5 MG/ML SOLN injection [id number] Yes See admin instructions. As of 08/09/2019: Add 7 ml of Remodulin to cassette and 93 ml of sterile diluent for Remodulin to cassetet for a total volume of 100 ml to make a concentration of 350,000 ng per ml.  Infuse via a CADD pump intravenously at a rate of 40 ml per 24 hours. Based on a dosing weight of 120 kg, the dose is 81 ng per kg per min.  Once opened, discard Remodulin vial after 30 days.  Sterile Diluent for Remodulin vials are single use only. Once mixed, discard cassette after 48 hours. [provider] Taking Active Self           Med Note Ralene Cork Dec 17, 2019 12:50 PM) Continuous infusion 12/17/19  vitamin B-12 (CYANOCOBALAMIN) 500 MCG  tablet [id number] Yes Take 500 mcg by mouth daily. [provider] Taking Active Self          Patient Active Problem List   Diagnosis Date Noted  . Internal hemorrhoid 01/11/2020  . External hemorrhoid 01/11/2020  . Fibroid, uterine 11/07/2019  . Vagina bleeding 10/15/2019  . 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 Bleeding from the genitourinary system 09/13/2019  . Gross hematuria 09/13/2019  . Chronic venous stasis 05/07/2019  . Urinary incontinence 12/12/2018  . 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 Acute on chronic respiratory failure ([place]) 06/15/2018  . Right ovarian cyst 05/19/2017  . Peripheral neuropathy ([place]) 09/27/2014  . Healthcare maintenance 09/27/2014  . Acute on chronic diastolic (congestive) heart failure ([place]) 06/25/2013  . Atrial flutter ([place]) 08/17/2012  . Pulmonary hypertension ([place]) 08/16/2012  . Right heart failure, NYHA class 3 ([place]) 03/15/2012  . Allergic rhinitis 11/16/2011  .  Diabetes ([place]) 10/15/2010  . Hyperlipidemia 10/15/2010  . Obesity 10/14/2010  . Obstructive sleep apnea 02/21/2009  . GERD 11/29/2005    Conditions to be addressed/monitored per PCP order:  CHF and hemorrhoids  Care Plan : Heart Failure (Adult)  Updates made by Faith Montane, RN since 02/13/2020 12:00 AM    Problem: [place]tom Management     Long-Range Goal: Symptom Exacerbation Prevented or Minimized   Start Date: 12/11/2019  Expected End Date: 04/15/2020  Recent Progress: On track  Priority: High  Note:   Current Barriers:  . Chronic Disease Management support and education needs related management of multiple chronic diagnosis; CHF, COPD, OSA and T2DM. Today Ms Matos reports having episodes of dizziness. This is a new symptom for her. She has not notified her provider. Provider notified, labs collected. Hgb down to 10.8 from 13.2. Patient reports that she has not been taking iron(d/c by physician) and she has not been eating a  healthy diet. Update-Dizziness has improved with resting and healthy diet . Lacks social connections . Does not contact provider office for questions/concerns  Nurse Case Manager Clinical Goal(s):  Marland Kitchen Over the next 30 days, patient will verbalize understanding of plan for management of new symptoms . Over the next 30 days, patient will attend all scheduled medical appointments: Pulmonology 02/26/20 and PCP appointment to be scheduled . Over the next 30 days, patient will demonstrate improved health management independence as evidenced by reporting new symptoms or concerns to provider-Met  Interventions:  . Inter-disciplinary care team collaboration (see longitudinal plan of care) . Advised patient to call PCP and report new onset of dizziness . Reviewed medications with patient and discussed taking as directed . Discussed plans with patient for ongoing care management follow up and provided patient with direct contact information for care management team . Provided patient and/or caregiver with number for [employer], patient to call and request a pulse oximeter and scale. . Encouraged Ms Switala to call and schedule an appointment with her PCP . Advised Ms [place] to include more iron rich foods in her diet, education material to be mailed  Patient Goals/Self-Care Activities Over the next 30 days, patient will: - begin a heart failure diary - eat more whole grains, fruits and vegetables, lean meats and healthy fats - know when to call the doctor - track symptoms and what helps feel better or worse  -Self administers medications as prescribed  -Calls pharmacy for medication refills - Calls provider office for new concerns or questions - Incorporate more iron rich foods into your diet - increase activity, doing light exercises 2-3 times a week  Follow Up Plan: Telephone follow up appointment with Managed Medicaid care management team member scheduled for:04/15/20 @ 2:30pm    Care Plan :  Health Management  Updates made by Faith Montane, RN since 02/13/2020 12:00 AM    Problem: Hemorrhoid Management     Goal: Plan of care for newly diagnosed hemorrhoids developed Completed 02/13/2020  Start Date: 01/11/2020  Expected End Date: 02/13/2020  Recent Progress: On track  Priority: Medium  Note:   Current Barriers:  . Care Coordination needs related to treatment of newly diagnosed hemorrhoids. Ms Barba has history of rectal bleeding and was recently diagnosed with internal and external hemorrhoids while on blood thinners. A referral was placed for surgical consult in December, an appointment has not been scheduled at this time. . Transportation barriers . Cognitive Deficits  Nurse Case Manager Clinical Goal(s):  Marland Kitchen Over the  next 30 days, patient will verbalize understanding of plan for hemorrhoid treatment-Met-Due to Ms Currington's health conditions, the plan is to improve her diet, increasing fiber, green leafy vegetables, to avoid irritation to hemorrhoids . Over the next 30 days, patient will attend all scheduled medical appointments: 02/05/20 @ 10:15 at [facility] for consult-Met . Over the next 30 days, patient will work with CM team pharmacist to review medications-Met  Interventions:  . Inter-disciplinary care team collaboration (see longitudinal plan of care) . Advised patient to avoid constipation, keep bowel movements soft to avoid irritating hemorrhoids, use a sitz bath several times a day . Provided education to patient re: hemorrhoids . Reviewed medications with patient and discussed taking as directed . Collaborated with [facility] [phone] regarding referral-They had attempted to call Ms. Coulthard on 12/24/19. Today we scheduled a consultation on 02/05/20 @ 10:15am . Pharmacy referral for medication review  Patient Goals/Self-Care Activities Over the next 30 days, patient will: -Self administers medications as prescribed -Attends all scheduled  provider appointments -Calls pharmacy for medication refills -Calls provider office for new concerns or questions -Avoid constipation or irritation of hemorrhoids -Utilize medical transportation as needed, provided by [employer] [phone]  Follow Up Plan:  No further follow up required        Follow Up:  Patient agrees to Care Plan and Follow-up.  Plan: The Managed Medicaid care management team will reach out to the patient again over the next 60 days.  Date/time of next scheduled RN care management/care coordination outreach: 04/15/20 @ 2:30p  [name] RN, BSN [place] RN Care Coordinator

## 2020-02-19 ENCOUNTER — Other Ambulatory Visit: Payer: Self-pay | Admitting: Student

## 2020-02-26 ENCOUNTER — Other Ambulatory Visit: Payer: Self-pay | Admitting: Student

## 2020-02-26 DIAGNOSIS — I50810 Right heart failure, unspecified: Secondary | ICD-10-CM

## 2020-02-27 ENCOUNTER — Telehealth: Payer: Self-pay

## 2020-02-27 NOTE — Telephone Encounter (Signed)
Great, thanks. I'm happy to call patient if she has any concerns or questions about her medications.

## 2020-02-27 NOTE — Telephone Encounter (Signed)
Received TC from patient who states she is returning Dr. Durenda Age call.  There are no telephone notes in chart.  Pt states it was r/t 2 RX's, Xarelto and Spironolactone.  After chart review, patient informed both Rx's were refilled via [place].    After discussing with patient, RN ascertained that patient received a text message from [facility], not a phone call, informing her RX's were sent from Dr. Wynetta Emery.  SChaplin, RN,BSN

## 2020-03-04 DIAGNOSIS — I270 Primary pulmonary hypertension: Secondary | ICD-10-CM | POA: Diagnosis not present

## 2020-03-04 DIAGNOSIS — J984 Other disorders of lung: Secondary | ICD-10-CM | POA: Diagnosis not present

## 2020-03-04 DIAGNOSIS — I5030 Unspecified diastolic (congestive) heart failure: Secondary | ICD-10-CM | POA: Diagnosis not present

## 2020-03-04 DIAGNOSIS — G4730 Sleep apnea, unspecified: Secondary | ICD-10-CM | POA: Diagnosis not present

## 2020-03-08 ENCOUNTER — Other Ambulatory Visit: Payer: Self-pay | Admitting: Internal Medicine

## 2020-03-25 DIAGNOSIS — I272 Pulmonary hypertension, unspecified: Secondary | ICD-10-CM | POA: Diagnosis not present

## 2020-03-25 DIAGNOSIS — G4733 Obstructive sleep apnea (adult) (pediatric): Secondary | ICD-10-CM | POA: Diagnosis not present

## 2020-03-25 DIAGNOSIS — R0600 Dyspnea, unspecified: Secondary | ICD-10-CM | POA: Diagnosis not present

## 2020-03-25 DIAGNOSIS — I5032 Chronic diastolic (congestive) heart failure: Secondary | ICD-10-CM | POA: Diagnosis not present

## 2020-03-26 ENCOUNTER — Ambulatory Visit: Payer: Medicaid Other | Admitting: Pulmonary Disease

## 2020-03-26 ENCOUNTER — Encounter: Payer: Self-pay | Admitting: Pulmonary Disease

## 2020-03-26 ENCOUNTER — Telehealth: Payer: Self-pay | Admitting: Student

## 2020-03-26 ENCOUNTER — Other Ambulatory Visit: Payer: Self-pay

## 2020-03-26 VITALS — BP 124/80 | HR 73 | Temp 97.8°F | Ht 63.0 in | Wt 280.0 lb

## 2020-03-26 DIAGNOSIS — G4733 Obstructive sleep apnea (adult) (pediatric): Secondary | ICD-10-CM | POA: Diagnosis not present

## 2020-03-26 MED ORDER — BENZONATATE 200 MG PO CAPS: 200.0000 mg | ORAL_CAPSULE | Freq: Three times a day (TID) | ORAL | 1 refills | Status: AC | PRN

## 2020-03-26 NOTE — Telephone Encounter (Signed)
I attempted to reach [name] today to get her scheduled for a phone visit with the Managed Medicaid Pharmacist. I left my name and number on her voicemail. This is my 2nd attempt to reach Ms.Pilger.

## 2020-03-26 NOTE — Patient Instructions (Signed)
I will see you back in 3 months  We will contact [employer] for CPAP supplies  Tessalon Perles for cough-prescription sent to pharmacy  Call with significant concerns

## 2020-03-26 NOTE — Progress Notes (Signed)
[name]    [id number]    09-03-1960  Primary Care Physician:Johnson, Rodman Key, MD  Referring Physician: [name], MD [address] [place],  [place] [address]  Chief complaint:   Patient with history of obstructive sleep apnea Pulmonary hypertension Chronic respiratory failure  She does have some shortness of breath, cough  HPI:  She is on AutoSet CPAP minimum pressure of 16, maximum pressure of 20 -Compliant with CPAP use with compliance data today showing 100% compliant with CPAP  She has an occasional cough, not really bringing up any secretions Does not feel acutely ill  Connection device for oxygen supplementation for CPAP did break yesterday, was not able to use oxygen with a CPAP -We will contact DME company to help with this  Doing relatively well otherwise  Does get short of breath with activity  She is not feeling acutely ill   History of pulmonary hypertension and chronic systolic heart failure  Morbid obesity, cognitive impairment, diastolic heart failure, paroxysmal atrial fibrillation, cor pulmonale, severe pulmonary hypertension -Currently on treprostinil and sildenafil  Feels relatively well Sleeps well at night goes to bed about midnight 3-4 awakenings  She is on 4 L oxygen Recent exercise study reveals 68% predicted Able to walk about 263 m  Outpatient Encounter Medications as of 03/26/2020  Medication Sig  . ACCU-CHEK AVIVA PLUS test strip USE 1 STRIP TO CHECK GLUCOSE ONCE DAILY  . acetaminophen (TYLENOL) 500 MG tablet Take 1 tablet (500 mg total) by mouth every 6 (six) hours as needed. (Patient taking differently: Take 500 mg by mouth every 6 (six) hours as needed (pain).)  . cetirizine (EQ ALLERGY RELIEF, CETIRIZINE,) 10 MG tablet Take 1 tablet (10 mg total) by mouth daily.  . clobetasol ointment (TEMOVATE) 2.45 % Apply 1 application topically as directed. Apply to vulva: qhs x 4wks & then every other night x 4wks, then  2x/week (Patient taking differently: Apply 1 application topically at bedtime.)  . cycloSPORINE (RESTASIS) 0.05 % ophthalmic emulsion Place 1 drop into both eyes 2 (two) times daily as needed (dry eyes).  . diphenhydrAMINE (BENADRYL) 25 MG tablet Take 50-100 mg by mouth 2 (two) times daily as needed for itching.  Marland Kitchen FIBER PO Take by mouth.  . fluticasone (FLONASE) 50 MCG/ACT nasal spray Place 1 spray into both nostrils daily. (Patient taking differently: Place 1 spray into both nostrils daily as needed for allergies.)  . gabapentin (NEURONTIN) 300 MG capsule TAKE 2 CAPSULES BY MOUTH THREE TIMES DAILY (Patient taking differently: Take 600 mg by mouth 3 (three) times daily.)  . metFORMIN (GLUCOPHAGE) 500 MG tablet TAKE 1/2 (ONE-HALF) TABLET BY MOUTH WITH BREAKFAST  . Na Sulfate-K Sulfate-Mg Sulf (SUPREP BOWEL PREP KIT) 17.5-3.13-1.6 GM/177ML SOLN Take 1 kit by mouth as directed.  . OXYGEN Inhale 4 L into the lungs continuous.  . pantoprazole (PROTONIX) 20 MG tablet Take 1 tablet (20 mg total) by mouth 2 (two) times daily.  . potassium chloride SA (KLOR-CON M20) 20 MEQ tablet Take 2 tablets (40 mEq total) by mouth 2 (two) times daily.  . pravastatin (PRAVACHOL) 20 MG tablet Take 1 tablet (20 mg total) by mouth daily.  . sildenafil (REVATIO) 20 MG tablet TAKE 1 TABLET THREE TIMES A DAY ( GENERIC FOR REVATIO )  . spironolactone (ALDACTONE) 25 MG tablet Take 1/2 (one-half) tablet by mouth once daily  . torsemide (DEMADEX) 20 MG tablet TAKE 4 TABLETS BY MOUTH TWICE DAILY  .  treprostinil (REMODULIN) 5 MG/ML SOLN injection See admin instructions. As of 08/09/2019: Add 7 ml of Remodulin to cassette and 93 ml of sterile diluent for Remodulin to cassetet for a total volume of 100 ml to make a concentration of 350,000 ng per ml.  Infuse via a CADD pump intravenously at a rate of 40 ml per 24 hours. Based on a dosing weight of 120 kg, the dose is 81 ng per kg per min.  Once opened, discard Remodulin vial after 30  days.  Sterile Diluent for Remodulin vials are single use only. Once mixed, discard cassette after 48 hours.  . vitamin B-12 (CYANOCOBALAMIN) 500 MCG tablet Take 500 mcg by mouth daily.  [name] 20 MG TABS tablet TAKE 1 TABLET BY MOUTH ONCE DAILY WITH SUPPER   No facility-administered encounter medications on file as of 03/26/2020.    Allergies as of 03/26/2020 - Review Complete 03/26/2020  Allergen Reaction Noted  . Aspirin Swelling 12/09/2005  . Codeine Other (See Comments)   . Lisinopril Cough 05/02/2012  . Sulfonamide derivatives Swelling 10/19/2005  . Latex Rash 01/07/2016    Past Medical History:  Diagnosis Date  . Anemia, iron deficiency    secondary to menhorrhagia, on oral iron, also b12 def, getting monthly b12 shots  . Cellulitis 05/2019   RIGHT LOWER EXTREMITY  . CHF (congestive heart failure) ([place])   . Chronic cough    secondary to alleriges and post nasal drip  . Cognitive impairment   . COPD (chronic obstructive pulmonary disease) ([place])   . Cor pulmonale (HCC)    PA Peak pressure 46mHg  . Depression   . Diabetes mellitus    well controlled on metformin  . Diastolic heart failure ([facility]   . GERD (gastroesophageal reflux disease)   . H/O mental retardation   . Herpes   . Hyperlipidemia   . Hypertension   . OSA (obstructive sleep apnea)    CPAP  . Pulmonary hypertension ([place]   . Renal disorder   . Restrictive lung disease    PFTs 06/2012 (FVC 54% predicted and FEV1 68% predicted w minimal bronchodilator response).  . Shortness of breath   . Venous stasis ulcer ([facility]    chornic, ?followed up at wound care center, multiple courses of antibiotics in past for cellulitis, on lasix    Past Surgical History:  Procedure Laterality Date  . CARDIAC CATHETERIZATION N/A 01/13/2016   Procedure: Right Heart Cath;  Surgeon: DJolaine Artist MD;  [facility];  Service: Cardiovascular;  Laterality: N/A;  . CHOLECYSTECTOMY    . COLONOSCOPY WITH  PROPOFOL N/A 12/20/2019   Procedure: COLONOSCOPY WITH PROPOFOL;  Surgeon: JMilus Banister MD;  Location: [facility];  Service: Endoscopy;  Laterality: N/A;  . IR FLUORO GUIDE CV LINE LEFT  11/16/2019  . IR FLUORO GUIDE CV LINE RIGHT  05/09/2019  . IR FLUORO GUIDE CV LINE RIGHT  08/12/2019  . IR REMOVAL TUN CV CATH W/O FL  05/05/2019  . IR [place]ASC ACCESS LEFT  11/16/2019  . IR [place] VASC ACCESS RIGHT  05/09/2019  . IR [place] VASC ACCESS RIGHT  08/12/2019  . IR [place] VASC ACCESS RIGHT  11/16/2019  . IR VENO/JUGULAR RIGHT  11/16/2019  . LEFT AND RIGHT HEART CATHETERIZATION WITH CORONARY ANGIOGRAM N/A 02/28/2014   Procedure: LEFT AND RIGHT HEART CATHETERIZATION WITH CORONARY ANGIOGRAM;  Surgeon: DJolaine Artist MD;  [facility];  Service: Cardiovascular;  Laterality: N/A;  .  RIGHT HEART CATH N/A 06/27/2018   Procedure: RIGHT HEART CATH;  Surgeon: Jolaine Artist, MD;  Location: [facility];  Service: Cardiovascular;  Laterality: N/A;  . RIGHT/LEFT HEART CATH AND CORONARY ANGIOGRAPHY N/A 05/14/2019   Procedure: RIGHT/LEFT HEART CATH AND CORONARY ANGIOGRAPHY;  Surgeon: Jolaine Artist, MD;  Location: [facility];  Service: Cardiovascular;  Laterality: N/A;    Family History  Problem Relation Age of Onset  . Kidney Stones Son   . Mental illness Sister   . Bipolar disorder Sister   . Mental retardation Brother   . Hyperlipidemia Mother   . Breast cancer Maternal Aunt   . Colon cancer Neg Hx   . Pancreatic cancer Neg Hx   . Esophageal cancer Neg Hx     Social History   Socioeconomic History  . Marital status: Single    Spouse name: Not on file  . Number of children: Not on file  . Years of education: Not on file  . Highest education level: Not on file  Occupational History  . Not on file  Tobacco Use  . Smoking status: Never Smoker  . Smokeless tobacco: Never Used  Vaping Use  . Vaping Use: Never used  Substance and Sexual Activity  .  Alcohol use: No    Alcohol/week: 0.0 standard drinks  . Drug use: No  . Sexual activity: Not Currently  Other Topics Concern  . Not on file  Social History Narrative  . Not on file   Social Determinants of Health   Financial Resource Strain: Not on file  Food Insecurity: No Food Insecurity  . Worried About Charity fundraiser in the Last Year: Never true  . Ran Out of Food in the Last Year: Never true  Transportation Needs: No Transportation Needs  . Lack of Transportation (Medical): No  . Lack of Transportation (Non-Medical): No  Physical Activity: Not on file  Stress: Not on file  Social Connections: Moderately Isolated  . Frequency of Communication with Friends and Family: Once a week  . Frequency of Social Gatherings with Friends and Family: More than three times a week  . Attends Religious Services: 1 to 4 times per year  . Active Member of Clubs or Organizations: No  . Attends Archivist Meetings: Never  . Marital Status: Never married  Intimate Partner Violence: Not on file    Review of Systems  Constitutional: Positive for fatigue.  Respiratory: Positive for apnea, cough and shortness of breath.   Cardiovascular: Positive for leg swelling.  Gastrointestinal: Negative.   Genitourinary: Negative.   Musculoskeletal: Positive for arthralgias.  Psychiatric/Behavioral: Positive for sleep disturbance.    Vitals:   03/26/20 1020  BP: 124/80  Pulse: 73  Temp: 97.8 F (36.6 C)  SpO2: 95%     Physical Exam Constitutional:      Appearance: She is obese.  HENT:     Head: Normocephalic.     Mouth/Throat:     Mouth: Mucous membranes are moist.  Eyes:     General: No scleral icterus.       Right eye: No discharge.        Left eye: No discharge.  Cardiovascular:     Rate and Rhythm: Normal rate and regular rhythm.     Pulses: Normal pulses.     Heart sounds: No murmur heard.   Pulmonary:     Effort: Pulmonary effort is normal. No respiratory  distress.     Breath  sounds: Normal breath sounds. No stridor. No wheezing or rhonchi.  Musculoskeletal:        General: Swelling present. Normal range of motion.     Cervical back: No rigidity or tenderness.  Neurological:     Mental Status: She is alert.  Psychiatric:        Mood and Affect: Mood normal.    Results of the Epworth flowsheet 08/22/2019  Sitting and reading 0  Watching TV 1  Sitting, inactive in a public place (e.g. a theatre or a meeting) 2  As a passenger in a car for an hour without a break 2  Lying down to rest in the afternoon when circumstances permit 1  Sitting and talking to someone 0  Sitting quietly after a lunch without alcohol 1  In a car, while stopped for a few minutes in traffic 0  Total score 7   Data Reviewed: Right heart catheterization results noted  Compliance data reveals 100% compliance AutoSet 16-20 Continues to be very compliant  Assessment:  History of obstructive sleep apnea on CPAP therapy She is on auto CPAP 16-20 Compliance is 100% -Continues to tolerate CPAP well  Pulmonary hypertension -On Remodulin, sildenafil  Diastolic heart failure -Remains on diuretics  Morbid obesity -Ambulates with a walker -Tries to stay active  Atrial flutter -On anticoagulation  Cough -Not feeling acutely ill  Plan/Recommendations:  Continue using CPAP on a regular basis  We will contact medical supply company to assist with CPAP supplies  Tessalon Perles for cough  Encouraged to try and stay active,  Follow-up in 3 months  Sherrilyn Rist MD [facility] 03/26/2020, 10:33 AM  CC: [name], MD

## 2020-03-27 ENCOUNTER — Other Ambulatory Visit: Payer: Self-pay | Admitting: Student

## 2020-03-27 ENCOUNTER — Other Ambulatory Visit: Payer: Self-pay | Admitting: Student in an Organized Health Care Education/Training Program

## 2020-03-27 DIAGNOSIS — I50810 Right heart failure, unspecified: Secondary | ICD-10-CM

## 2020-03-27 DIAGNOSIS — E7800 Pure hypercholesterolemia, unspecified: Secondary | ICD-10-CM

## 2020-03-27 DIAGNOSIS — K219 Gastro-esophageal reflux disease without esophagitis: Secondary | ICD-10-CM

## 2020-03-31 ENCOUNTER — Other Ambulatory Visit: Payer: Self-pay | Admitting: Internal Medicine

## 2020-03-31 DIAGNOSIS — G629 Polyneuropathy, unspecified: Secondary | ICD-10-CM

## 2020-04-03 ENCOUNTER — Other Ambulatory Visit: Payer: Self-pay
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
## 2020-04-03 NOTE — Patient Instructions (Signed)
Visit Information  [name] was given information about Medicaid Managed Care team care coordination services as a part of their Healthy [facility] Medicaid benefit. [name] verbally consented to engagement with the [facility] Managed Care team.   For questions related to your Healthy [facility] health plan, please call: [phone] or visit the homepage here: [email or web address]  If you would like to schedule transportation through your Healthy [facility] plan, please call the following number at least 2 days in advance of your appointment: [phone]   Call the [facility] at [phone], at any time, 24 hours a day, 7 days a week. If you are in danger or need immediate medical attention call 911.  [name] - following are the goals we discussed in your visit today:  Goals Addressed   None     Please see education materials related to DM provided as print materials.   Patient verbalizes understanding of instructions provided today.   The Managed Medicaid care management team will reach out to the patient again over the next 30 days.   [place] Constable, Pharm.D., Managed Medicaid Pharmacist [phone]   Following is a copy of your plan of care:  Patient Care Plan: Heart Failure (Adult)    Problem Identified: Symtom Management     Long-Range Goal: Symptom Exacerbation Prevented or Minimized   Start Date: 12/11/2019  Expected End Date: 04/15/2020  Recent Progress: On track  Priority: High  Note:   Current Barriers:  . Chronic Disease Management support and education needs related management of multiple chronic diagnosis; CHF, COPD, OSA and T2DM. Today [name] reports having episodes of dizziness. This is a new symptom for her. She has not notified her provider. Provider notified, labs collected. Hgb down to 10.8 from 13.2. Patient reports that she has not been taking iron(d/c by physician) and she has not been  eating a healthy diet. Update-Dizziness has improved with resting and healthy diet . Lacks social connections . Does not contact provider office for questions/concerns  Nurse Case Manager Clinical Goal(s):  Marland Kitchen Over the next 30 days, patient will verbalize understanding of plan for management of new symptoms . Over the next 30 days, patient will attend all scheduled medical appointments: Pulmonology 02/26/20 and PCP appointment to be scheduled . Over the next 30 days, patient will demonstrate improved health management independence as evidenced by reporting new symptoms or concerns to provider-Met  Interventions:  . Inter-disciplinary care team collaboration (see longitudinal plan of care) . Advised patient to call PCP and report new onset of dizziness . Reviewed medications with patient and discussed taking as directed . Discussed plans with patient for ongoing care management follow up and provided patient with direct contact information for care management team . Provided patient and/or caregiver with number for [employer], patient to call and request a pulse oximeter and scale. . Encouraged [name] to call and schedule an appointment with her PCP . Advised [name] to include more iron rich foods in her diet, education material to be mailed  Patient Goals/Self-Care Activities Over the next 30 days, patient will: - begin a heart failure diary - eat more whole grains, fruits and vegetables, lean meats and healthy fats - know when to call the doctor - track symptoms and what helps feel better or worse  -Self administers medications as prescribed  -Calls pharmacy for medication refills - Calls provider office for new concerns or questions - Incorporate more iron rich foods into your diet -  increase activity, doing light exercises 2-3 times a week  Follow Up Plan: Telephone follow up appointment with Managed Medicaid care management team member scheduled for:04/15/20 @ 2:30pm     Patient Care Plan: Health Management    Problem Identified: Hemorrhoid Management     Goal: Plan of care for newly diagnosed hemorrhoids developed Completed 02/13/2020  Start Date: 01/11/2020  Expected End Date: 02/13/2020  Recent Progress: On track  Priority: Medium  Note:   Current Barriers:  . Care Coordination needs related to treatment of newly diagnosed hemorrhoids. [name] has history of rectal bleeding and was recently diagnosed with internal and external hemorrhoids while on blood thinners. A referral was placed for surgical consult in December, an appointment has not been scheduled at this time. . Transportation barriers . Cognitive Deficits  Nurse Case Manager Clinical Goal(s):  Marland Kitchen Over the next 30 days, patient will verbalize understanding of plan for hemorrhoid treatment-Met-Due to [name]'s health conditions, the plan is to improve her diet, increasing fiber, green leafy vegetables, to avoid irritation to hemorrhoids . Over the next 30 days, patient will attend all scheduled medical appointments: 02/05/20 @ 10:15 at [facility] consult-Met . Over the next 30 days, patient will work with CM team pharmacist to review medications-Met  Interventions:  . Inter-disciplinary care team collaboration (see longitudinal plan of care) . Advised patient to avoid constipation, keep bowel movements soft to avoid irritating hemorrhoids, use a sitz bath several times a day . Provided education to patient re: hemorrhoids . Reviewed medications with patient and discussed taking as directed . Collaborated with [name] [phone] regarding referral-They had attempted to call Faith. Barker on 12/24/19. Today we scheduled a consultation on 02/05/20 @ 10:15am . Pharmacy referral for medication review  Patient Goals/Self-Care Activities Over the next 30 days, patient will: -Self administers medications as prescribed -Attends all scheduled provider appointments -Calls  pharmacy for medication refills -Calls provider office for new concerns or questions -Avoid constipation or irritation of hemorrhoids -Utilize medical transportation as needed, provided by [employer] [phone]  Follow Up Plan:  No further follow up required      Patient Care Plan: Medication Management    Problem Identified: Health Promotion or Disease Self-Management (General Plan of Care)     Goal: Medication Management   Note:   Current Barriers:  . Does not maintain contact with provider office . Does not contact provider office for questions/concerns .   Pharmacist Clinical Goal(s):  Marland Kitchen Over the next 30 days, patient will contact provider office for questions/concerns as evidenced notation of same in electronic health record through collaboration with PharmD and provider.  .   Interventions: . Inter-disciplinary care team collaboration (see longitudinal plan of care) . Comprehensive medication review performed; medication list updated in electronic medical record  _0 @ _1 @ _2 @ Health Maintenance  Patient Goals/Self-Care Activities . Over the next 30 days, patient will:  - take medications as prescribed collaborate with provider on medication access solutions  Follow Up Plan: The care management team will reach out to the patient again over the next 30 days.     Task: Mutually Develop and Royce Macadamia Achievement of Patient Goals   Note:   Care Management Activities:    - verbalization of feelings encouraged    Notes:

## 2020-04-04 DIAGNOSIS — I5030 Unspecified diastolic (congestive) heart failure: Secondary | ICD-10-CM | POA: Diagnosis not present

## 2020-04-04 DIAGNOSIS — G4730 Sleep apnea, unspecified: Secondary | ICD-10-CM | POA: Diagnosis not present

## 2020-04-04 DIAGNOSIS — I270 Primary pulmonary hypertension: Secondary | ICD-10-CM | POA: Diagnosis not present

## 2020-04-04 DIAGNOSIS — J984 Other disorders of lung: Secondary | ICD-10-CM | POA: Diagnosis not present

## 2020-04-15 ENCOUNTER — Other Ambulatory Visit: Payer: Self-pay

## 2020-04-15 ENCOUNTER — Other Ambulatory Visit: Payer: Self-pay | Admitting: *Deleted

## 2020-04-15 DIAGNOSIS — K648 Other hemorrhoids: Secondary | ICD-10-CM

## 2020-04-15 DIAGNOSIS — K644 Residual hemorrhoidal skin tags: Secondary | ICD-10-CM

## 2020-04-15 NOTE — Patient Instructions (Signed)
Visit Information  [name] was given information about Medicaid Managed Care team care coordination services as a part of their Healthy [facility] Medicaid benefit. [name] verbally consented to engagement with the [facility] Managed Care team.   For questions related to your Healthy [facility] health plan, please call: [phone] or visit the homepage here: [email or web address]  If you would like to schedule transportation through your Healthy [facility] plan, please call the following number at least 2 days in advance of your appointment: [phone]   Call the [place] at [phone], at any time, 24 hours a day, 7 days a week. If you are in danger or need immediate medical attention call 911.  [name] - following are the goals we discussed in your visit today:  Goals Addressed            This Visit's Progress   . Track and Manage Fluids and Swelling-Heart Failure       Timeframe:  Long-Range Goal Priority:  High Start Date:   12/11/19                          Expected End Date: 05/20/20                    Follow Up Date 05/20/20   - call office if I gain more than 2 pounds in one day or 5 pounds in one week - keep legs up while sitting - track weight in diary - use salt in moderation - watch for swelling in feet, ankles and legs every day - weigh myself daily    Why is this important?    It is important to check your weight daily and watch how much salt and liquids you have.   It will help you to manage your heart failure.        . Track and Manage Symptoms-Heart Failure       Timeframe:  Long-Range Goal Priority:  High Start Date:   12/11/19                          Expected End Date:   05/20/20                Follow Up Date 05/21/19   - begin a heart failure diary - eat more whole grains, fruits and vegetables, lean meats and healthy fats - know when to call the doctor - track symptoms and what  helps feel better or worse  - increase activity, doing light exercises 2-3 times a week   Why is this important?    You will be able to handle your symptoms better if you keep track of them.   Making some simple changes to your lifestyle will help.   Eating healthy is one thing you can do to take good care of yourself.           Please see education materials related to diet provided as print materials.     Potassium Content of Foods  Potassium is a mineral found in many foods and drinks. It affects how the heart works, and helps keep fluids and minerals balanced in the body. The amount of potassium you need each day depends on your age and any medical conditions you may have. Talk to your health care provider or dietitian about how much potassium you need. The following lists of foods provide the  general serving size for foods and the approximate amount of potassium in each serving, listed in milligrams (mg). Actual values may vary depending on the product and how it is processed. High in potassium The following foods and beverages have 200 mg or more of potassium per serving:  Apricots (raw) - 2 have 200 mg of potassium.  Apricots (dry) - 5 have 200 mg of potassium.  Artichoke - 1 medium has 345 mg of potassium.  Avocado -  fruit has 245 mg of potassium.  Banana - 1 medium fruit has 425 mg of potassium.  [place] or baked beans (canned) -  cup has 280 mg of potassium.  White beans (canned) -  cup has 595 mg potassium.  Beef roast - 3 oz has 320 mg of potassium.  Ground beef - 3 oz has 270 mg of potassium.  Beets (raw or cooked) -  cup has 260 mg of potassium.  Bran muffin - 2 oz has 300 mg of potassium.  Broccoli (cooked) -  cup has 230 mg of potassium.  Brussels sprouts -  cup has 250 mg of potassium.  Cantaloupe -  cup has 215 mg of potassium.  Cereal, 100% bran -  cup has 200-400 mg of potassium.  Cheeseburger -1 single fast food burger has 225-400 mg  of potassium.  Chicken - 3 oz has 220 mg of potassium.  Clams (canned) - 3 oz has 535 mg of potassium.  Crab - 3 oz has 225 mg of potassium.  Dates - 5 have 270 mg of potassium.  Dried beans and peas -  cup has 300-475 mg of potassium.  Figs (dried) - 2 have 260 mg of potassium.  Fish (halibut, tuna, cod, snapper) - 3 oz has 480 mg of potassium.  Fish (salmon, haddock, swordfish, perch) - 3 oz has 300 mg of potassium.  Fish (tuna, canned) - 3 oz has 200 mg of potassium.  [place] fries (fast food) - 3 oz has 470 mg of potassium.  Granola with fruit and nuts -  cup has 200 mg of potassium.  Grapefruit juice -  cup has 200 mg of potassium.  Honeydew melon -  cup has 200 mg of potassium.  Kale (raw) - 1 cup has 300 mg of potassium.  Kiwi - 1 medium fruit has 240 mg of potassium.  Kohlrabi, rutabaga, parsnips -  cup has 280 mg of potassium.  Lentils -  cup has 365 mg of potassium.  Mango - 1 each has 325 mg of potassium.  Milk (nonfat, low-fat, whole, buttermilk) - 1 cup has 350-380 mg of potassium.  Milk (chocolate) - 1 cup has 420 mg of potassium  Molasses - 1 Tbsp has 295 mg of potassium.  Mushrooms -  cup has 280 mg of potassium.  Nectarine - 1 each has 275 mg of potassium.  Nuts (almonds, peanuts, hazelnuts, [place], cashew, mixed) - 1 oz has 200 mg of potassium.  Nuts (pistachios) - 1 oz has 295 mg of potassium.  Orange - 1 fruit has 240 mg of potassium.  Orange juice -  cup has 235 mg of potassium.  Papaya -  medium fruit has 390 mg of potassium.  Peanut butter (chunky) - 2 Tbsp has 240 mg of potassium.  Peanut butter (smooth) - 2 Tbsp has 210 mg of potassium.  Pear - 1 medium (200 mg) of potassium.  Pomegranate - 1 whole fruit has 400 mg of potassium.  Pomegranate juice -  cup has 215 mg  of potassium.  Pork - 3 oz has 350 mg of potassium.  Potato chips (salted) - 1 oz has 465 mg of potassium.  Potato (baked with skin) - 1 medium has  925 mg of potassium.  Potato (boiled) -  cup has 255 mg of potassium.  Potato (Mashed) -  cup has 330 mg of potassium.  Prune juice -  cup has 370 mg of potassium.  Prunes - 5 have 305 mg of potassium.  Pudding (chocolate) -  cup has 230 mg of potassium.  Pumpkin (canned) -  cup has 250 mg of potassium.  Raisins (seedless) -  cup has 270 mg of potassium.  Seeds (sunflower or pumpkin) - 1 oz has 240 mg of potassium.  Soy milk - 1 cup has 300 mg of potassium.  Spinach (cooked) - 1/2 cup has 420 mg of potassium.  Spinach (canned) -  cup has 370 mg of potassium.  Sweet potato (baked with skin) - 1 medium has 450 mg of potassium.  Swiss chard -  cup has 480 mg of potassium.  Tomato or vegetable juice -  cup has 275 mg of potassium.  Tomato (sauce or puree) -  cup has 400-550 mg of potassium.  Tomato (raw) - 1 medium has 290 mg of potassium.  Tomato (canned) -  cup has 200-300 mg of potassium.  [place] - 3 oz has 250 mg of potassium.  Wheat germ - 1 oz has 250 mg of potassium.  Winter squash -  cup has 250 mg of potassium.  Yogurt (plain or fruited) - 6 oz has 260-435 mg of potassium.  Zucchini -  cup has 220 mg of potassium. Moderate in potassium The following foods and beverages have 50-200 mg of potassium per serving:  Apple - 1 fruit has 150 mg of potassium  Apple juice -  cup has 150 mg of potassium  Applesauce -  cup has 90 mg of potassium  Apricot nectar -  cup has 140 mg of potassium  Asparagus (small spears) -  cup has 155 mg of potassium  Asparagus (large spears) - 6 have 155 mg of potassium  Bagel (cinnamon raisin) - 1 four-inch bagel has 130 mg of potassium  Bagel (egg or plain) - 1 four- inch bagel has 70 mg of potassium  Beans (green) -  cup has 90 mg of potassium  Beans (yellow) -  cup has 190 mg of potassium  Beer, regular - 12 oz has 100 mg of potassium  Beets (canned) -  cup has 125 mg of potassium  Blackberries -   cup has 115 mg of potassium  Blueberries -  cup has 60 mg of potassium  Bread (whole wheat) - 1 slice has 70 mg of potassium  Broccoli (raw) -  cup has 145 mg of potassium  Cabbage -  cup has 150 mg of potassium  Carrots (cooked or raw) -  cup has 180 mg of potassium  Cauliflower (raw) -  cup has 150 mg of potassium  Celery (raw) -  cup has 155 mg of potassium  Cereal, bran flakes -  cup has 120-150 mg of potassium  Cheese (cottage) -  cup has 110 mg of potassium  Cherries - 10 have 150 mg of potassium  Chocolate - 1 oz bar has 165 mg of potassium  Coffee (brewed) - 6 oz has 90 mg of potassium  Corn -  cup or 1 ear has 195 mg of potassium  Cucumbers -  cup  has 80 mg of potassium  Egg - 1 large egg has 60 mg of potassium  Eggplant -  cup has 60 mg of potassium  Endive (raw) -  cup has 80 mg of potassium  English muffin - 1 has 65 mg of potassium  Fish (ocean perch) - 3 oz has 192 mg of potassium  Frankfurter, beef or pork - 1 has 75 mg of potassium  Fruit cocktail -  cup has 115 mg of potassium  Grape juice -  cup has 170 mg of potassium  Grapefruit -  fruit has 175 mg of potassium  Grapes -  cup has 155 mg of potassium  Greens: kale, turnip, collard -  cup has 110-150 mg of potassium  Ice cream or frozen yogurt (chocolate) -  cup has 175 mg of potassium  Ice cream or frozen yogurt (vanilla) -  cup has 120-150 mg of potassium  Lemons, limes - 1 each has 80 mg of potassium  Lettuce - 1 cup has 100 mg of potassium  Mixed vegetables -  cup has 150 mg of potassium  Mushrooms, raw -  cup has 110 mg of potassium  Nuts (walnuts, pecans, or macadamia) - 1 oz has 125 mg of potassium  Oatmeal -  cup has 80 mg of potassium  Okra -  cup has 110 mg of potassium  Onions -  cup has 120 mg of potassium  Peach - 1 has 185 mg of potassium  Peaches (canned) -  cup has 120 mg of potassium  Pears (canned) -  cup has 120 mg of  potassium  Peas, green (frozen) -  cup has 90 mg of potassium  Peppers (Green) -  cup has 130 mg of potassium  Peppers (Red) -  cup has 160 mg of potassium  Pineapple juice -  cup has 165 mg of potassium  Pineapple (fresh or canned) -  cup has 100 mg of potassium  Plums - 1 has 105 mg of potassium  Pudding, vanilla -  cup has 150 mg of potassium  Raspberries -  cup has 90 mg of potassium  Rhubarb -  cup has 115 mg of potassium  Rice, wild -  cup has 80 mg of potassium  Shrimp - 3 oz has 155 mg of potassium  Spinach (raw) - 1 cup has 170 mg of potassium  Strawberries -  cup has 125 mg of potassium  Summer squash -  cup has 175-200 mg of potassium  Swiss chard (raw) - 1 cup has 135 mg of potassium  Tangerines - 1 fruit has 140 mg of potassium  Tea, brewed - 6 oz has 65 mg of potassium  Turnips -  cup has 140 mg of potassium  Watermelon -  cup has 85 mg of potassium  Wine (Red, table) - 5 oz has 180 mg of potassium  Wine (White, table) - 5 oz 100 mg of potassium Low in potassium The following foods and beverages have less than 50 mg of potassium per serving.  Bread (white) - 1 slice has 30 mg of potassium  Carbonated beverages - 12 oz has less than 5 mg of potassium  Cheese - 1 oz has 20-30 mg of potassium  Cranberries -  cup has 45 mg of potassium  Cranberry juice cocktail -  cup has 20 mg of potassium  Fats and oils - 1 Tbsp has less than 5 mg of potassium  Hummus - 1 Tbsp has 32 mg of potassium  Nectar (papaya, mango, or pear) -  cup has 35 mg of potassium  Rice (white or brown) -  cup has 50 mg of potassium  Spaghetti or macaroni (cooked) -  cup has 30 mg of potassium  Tortilla, flour or corn - 1 has 50 mg of potassium  Waffle - 1 four-inch waffle has 50 mg of potassium  Water chestnuts -  cup has 40 mg of potassium Summary  Potassium is a mineral found in many foods and drinks. It affects how the heart works, and helps keep  fluids and minerals balanced in the body.  The amount of potassium you need each day depends on your age and any existing medical conditions you may have. Your health care provider or dietitian may recommend an amount of potassium that you should have each day. This information is not intended to replace advice given to you by your health care provider. Make sure you discuss any questions you have with your health care provider. Document Revised: 12/03/2016 Document Reviewed: 03/17/2016 Elsevier Patient Education  2021 [place].    Iron-Rich Diet  Iron is a mineral that helps your body to produce hemoglobin. Hemoglobin is a protein in red blood cells that carries oxygen to your body's tissues. Eating too little iron may cause you to feel weak and tired, and it can increase your risk of infection. Iron is naturally found in many foods, and many foods have iron added to them (iron-fortified foods). You may need to follow an iron-rich diet if you do not have enough iron in your body due to certain medical conditions. The amount of iron that you need each day depends on your age, your sex, and any medical conditions you have. Follow instructions from your health care provider or a diet and nutrition specialist (dietitian) about how much iron you should eat each day. What are tips for following this plan? Reading food labels  Check food labels to see how many milligrams (mg) of iron are in each serving. Cooking  Cook foods in pots and pans that are made from iron.  Take these steps to make it easier for your body to absorb iron from certain foods: ? Soak beans overnight before cooking. ? Soak whole grains overnight and drain them before using. ? Ferment flours before baking, such as by using yeast in bread dough. Meal planning  When you eat foods that contain iron, you should eat them with foods that are high in vitamin C. These include oranges, peppers, tomatoes, potatoes, and mango.  Vitamin C helps your body to absorb iron. General information  Take iron supplements only as told by your health care provider. An overdose of iron can be life-threatening. If you were prescribed iron supplements, take them with orange juice or a vitamin C supplement.  When you eat iron-fortified foods or take an iron supplement, you should also eat foods that naturally contain iron, such as meat, poultry, and fish. Eating naturally iron-rich foods helps your body to absorb the iron that is added to other foods or contained in a supplement.  Certain foods and drinks prevent your body from absorbing iron properly. Avoid eating these foods in the same meal as iron-rich foods or with iron supplements. These foods include: ? Coffee, black tea, and red wine. ? Milk, dairy products, and foods that are high in calcium. ? Beans and soybeans. ? Whole grains. What foods should I eat? Fruits Prunes. Raisins. Eat fruits high in vitamin C, such as oranges,  grapefruits, and strawberries, alongside iron-rich foods. Vegetables Spinach (cooked). Green peas. Broccoli. Fermented vegetables. Eat vegetables high in vitamin C, such as leafy greens, potatoes, bell peppers, and tomatoes, alongside iron-rich foods. Grains Iron-fortified breakfast cereal. Iron-fortified whole-wheat bread. Enriched rice. Sprouted grains. Meats and other proteins Beef liver. Oysters. Beef. Shrimp. [place]. Chicken. [place]. Sardines. Chickpeas. Nuts. Tofu. Pumpkin seeds. Beverages Tomato juice. Fresh orange juice. Prune juice. Hibiscus tea. Fortified instant breakfast shakes. Sweets and desserts Blackstrap molasses. Seasonings and condiments Tahini. Fermented soy sauce. Other foods Wheat germ. The items listed above may not be a complete list of recommended foods and beverages. Contact a dietitian for more information. What foods should I avoid? Grains Whole grains. Bran cereal. Bran flour. Oats. Meats and other  proteins Soybeans. Products made from soy protein. Black beans. Lentils. Mung beans. Split peas. Dairy Milk. Cream. Cheese. Yogurt. Cottage cheese. Beverages Coffee. Black tea. Red wine. Sweets and desserts Cocoa. Chocolate. Ice cream. Other foods Basil. Oregano. Large amounts of parsley. The items listed above may not be a complete list of foods and beverages to avoid. Contact a dietitian for more information. Summary  Iron is a mineral that helps your body to produce hemoglobin. Hemoglobin is a protein in red blood cells that carries oxygen to your body's tissues.  Iron is naturally found in many foods, and many foods have iron added to them (iron-fortified foods).  When you eat foods that contain iron, you should eat them with foods that are high in vitamin C. Vitamin C helps your body to absorb iron.  Certain foods and drinks prevent your body from absorbing iron properly, such as whole grains and dairy products. You should avoid eating these foods in the same meal as iron-rich foods or with iron supplements. This information is not intended to replace advice given to you by your health care provider. Make sure you discuss any questions you have with your health care provider. Document Revised: 12/03/2016 Document Reviewed: 11/16/2016 Elsevier Patient Education  2021 [employer].   The patient verbalized understanding of instructions provided today and agreed to receive a mailed copy of patient instruction and/or educational materials.  Telephone follow up appointment with Managed Medicaid care management team member scheduled for:05/20/20 @ 2:30pm  [name] RN, BSN [place] RN Care Coordinator   Following is a copy of your plan of care:  Patient Care Plan: Heart Failure (Adult)    Problem Identified: Symtom Management     Long-Range Goal: Symptom Exacerbation Prevented or Minimized   Start Date: 12/11/2019  Expected End Date: 05/20/2020   This Visit's Progress: On track  Recent Progress: On track  Priority: High  Note:   Current Barriers:  . Chronic Disease Management support and education needs related management of multiple chronic diagnosis; CHF, COPD, OSA and T2DM. Today [name] reports having episodes of dizziness. This is a new symptom for her. She has not notified her provider. Provider notified, labs collected. Hgb down to 10.8 from 13.2. Patient reports that she has not been taking iron(d/c by physician) and she has not been eating a healthy diet. Dizziness has improved with resting and healthy diet. Discussed recent move with [name]. She recently moved into a studio apartment. She is no longer living with her son, he does continue to pick up her medications. She has a bus stop 1.5 blocks away and knows that she can use medical transportation for appointments. She is adjusting to living on her own. Marland Kitchen  Lacks social connections . Does not contact provider office for questions/concerns  Nurse Case Manager Clinical Goal(s):  . patient will verbalize understanding of plan for management of new symptoms . patient will attend all scheduled medical appointments: Pulmonology 02/26/20 and PCP 04/30/20 . patient will demonstrate improved health management independence as evidenced by reporting new symptoms or concerns to provider-Met  Interventions:  . Inter-disciplinary care team collaboration (see longitudinal plan of care) . Discussed plans with patient for ongoing care management follow up and provided patient with direct contact information for care management team . Advised [name] to include more iron rich foods in her diet, education material to be mailed . Reviewed upcoming appointments  . Therapeutic listening . Encouraged patient to contact RNCM if she has any barriers to living on her own or managing her healthcare  Patient Goals/Self-Care Activities Over the next 30 days, patient will: - begin a heart failure  diary - eat more whole grains, fruits and vegetables, lean meats and healthy fats - know when to call the doctor - track symptoms and what helps feel better or worse  -Self administers medications as prescribed  -Calls pharmacy for medication refills - Calls provider office for new concerns or questions - Incorporate more iron rich foods into your diet - increase activity, doing light exercises 2-3 times a week  Follow Up Plan: Telephone follow up appointment with Managed Medicaid care management team member scheduled for:05/20/20 @ 2:30pm    Patient Care Plan: Health Management    Problem Identified: Hemorrhoid Management     Goal: Plan of care for newly diagnosed hemorrhoids developed Completed 02/13/2020  Start Date: 01/11/2020  Expected End Date: 02/13/2020  Recent Progress: On track  Priority: Medium  Note:   Current Barriers:  . Care Coordination needs related to treatment of newly diagnosed hemorrhoids. [name] has history of rectal bleeding and was recently diagnosed with internal and external hemorrhoids while on blood thinners. A referral was placed for surgical consult in December, an appointment has not been scheduled at this time. . Transportation barriers . Cognitive Deficits  Nurse Case Manager Clinical Goal(s):  Marland Kitchen Over the next 30 days, patient will verbalize understanding of plan for hemorrhoid treatment-Met-Due to [name] health conditions, the plan is to improve her diet, increasing fiber, green leafy vegetables, to avoid irritation to hemorrhoids . Over the next 30 days, patient will attend all scheduled medical appointments: 02/05/20 @ 10:15 at [facility] for consult-Met . Over the next 30 days, patient will work with CM team pharmacist to review medications-Met  Interventions:  . Inter-disciplinary care team collaboration (see longitudinal plan of care) . Advised patient to avoid constipation, keep bowel movements soft to avoid irritating hemorrhoids,  use a sitz bath several times a day . Provided education to patient re: hemorrhoids . Reviewed medications with patient and discussed taking as directed . Collaborated with [facility] [phone] regarding referral-They had attempted to call [name] on 12/24/19. Today we scheduled a consultation on 02/05/20 @ 10:15am . Pharmacy referral for medication review  Patient Goals/Self-Care Activities Over the next 30 days, patient will: -Self administers medications as prescribed -Attends all scheduled provider appointments -Calls pharmacy for medication refills -Calls provider office for new concerns or questions -Avoid constipation or irritation of hemorrhoids -Utilize medical transportation as needed, provided by [employer] [phone]  Follow Up Plan:  No further follow up required      Patient Care Plan: Medication Management    Problem Identified:  Health Promotion or Disease Self-Management (General Plan of Care)     Goal: Medication Management   Note:   Current Barriers:  . Does not maintain contact with provider office . Does not contact provider office for questions/concerns .   Pharmacist Clinical Goal(s):  Marland Kitchen Over the next 30 days, patient will contact provider office for questions/concerns as evidenced notation of same in electronic health record through collaboration with PharmD and provider.  .   Interventions: . Inter-disciplinary care team collaboration (see longitudinal plan of care) . Comprehensive medication review performed; medication list updated in electronic medical record  _0 @ _1 @ _2 @ Health Maintenance  Patient Goals/Self-Care Activities . Over the next 30 days, patient will:  - take medications as prescribed collaborate with provider on medication access solutions  Follow Up Plan: The care management team will reach out to the patient again over the next 30 days.

## 2020-04-15 NOTE — Patient Outreach (Signed)
Medicaid Managed Care   Nurse Care Manager Note  04/15/2020 Name:  [name] MRN:  [id number] DOB:  September 10, 1960  [name] is an 60 y.o. year old female who is a primary patient of [name], MD.  The [facility] Managed Care Coordination team was consulted for assistance with:    CHF COPD  [name] was given information about Medicaid Managed Care Coordination team services today. [name] agreed to services and verbal consent obtained.  Engaged with patient by telephone for follow up visit in response to provider referral for case management and/or care coordination services.   Assessments/Interventions:  Review of past medical history, allergies, medications, health status, including review of consultants reports, laboratory and other test data, was performed as part of comprehensive evaluation and provision of chronic care management services.  SDOH (Social Determinants of Health) assessments and interventions performed:   Care Plan  Allergies  Allergen Reactions  . Aspirin Swelling    REACTION: airway swelling  . Codeine Other (See Comments)    REACTION: tingling in lips and hard breathing - had reaction at dentist - states "I can't take certain kinds of codeine" - happened maybe 10 yr ago  . Lisinopril Cough  . Sulfonamide Derivatives Swelling    REACTION: airway swelling  . Latex Rash    Medications Reviewed Today    Reviewed by Laurin Coder, MD (Physician) on 03/26/20 at 2117  Med List Status: <None>  Medication Order Taking? Sig Documenting Provider Last Dose Status Informant  ACCU-CHEK AVIVA PLUS test strip [id number] Yes USE 1 STRIP TO CHECK GLUCOSE ONCE DAILY Winfrey, Jenne Pane, MD Taking Active Self  acetaminophen (TYLENOL) 500 MG tablet [id number] Yes Take 1 tablet (500 mg total) by mouth every 6 (six) hours as needed.  Patient taking differently: Take 500 mg by mouth every 6 (six) hours as needed (pain).   [name], MD Taking  Active   benzonatate (TESSALON) 200 MG capsule [id number] Yes Take 1 capsule (200 mg total) by mouth 3 (three) times daily as needed for cough. Laurin Coder, MD  Active   cetirizine (EQ ALLERGY RELIEF, CETIRIZINE,) 10 MG tablet [id number] Yes Take 1 tablet (10 mg total) by mouth daily. [name], [name], DO Taking Active   clobetasol ointment (TEMOVATE) 0.05 % [id number] Yes Apply 1 application topically as directed. Apply to vulva: qhs x 4wks & then every other night x 4wks, then 2x/week  Patient taking differently: Apply 1 application topically at bedtime.   [name], MD Taking Active   cycloSPORINE (RESTASIS) 0.05 % ophthalmic emulsion [id number] Yes Place 1 drop into both eyes 2 (two) times daily as needed (dry eyes). [provider] Taking Active Self  diphenhydrAMINE (BENADRYL) 25 MG tablet [id number] Yes Take 50-100 mg by mouth 2 (two) times daily as needed for itching. [provider] Taking Active Self  FIBER PO [id number] Yes Take by mouth. [provider] Taking Active   fluticasone (FLONASE) 50 MCG/ACT nasal spray [id number] Yes Place 1 spray into both nostrils daily.  Patient taking differently: Place 1 spray into both nostrils daily as needed for allergies.   [name], MD Taking Active   gabapentin (NEURONTIN) 300 MG capsule [id number] Yes TAKE 2 CAPSULES BY MOUTH THREE TIMES DAILY  Patient taking differently: Take 600 mg by mouth 3 (three) times daily.   [name], MD Taking Active   metFORMIN (GLUCOPHAGE) 500 MG tablet [id number] Yes TAKE 1/2 (ONE-HALF) TABLET BY MOUTH WITH  Sharion Dove, MD Taking Active   Na Sulfate-K Sulfate-Mg Sulf [facility] BOWEL PREP KIT) 17.5-3.13-1.6 GM/177ML SOLN [id number] Yes Take 1 kit by mouth as directed. [name], MD Taking Active   OXYGEN [id number] Yes Inhale 4 L into the lungs continuous. [provider] Taking Active Self  pantoprazole (PROTONIX) 20 MG tablet [id number] Yes  Take 1 tablet (20 mg total) by mouth 2 (two) times daily. [name], MD Taking Active Self  potassium chloride SA (KLOR-CON M20) 20 MEQ tablet [id number] Yes Take 2 tablets (40 mEq total) by mouth 2 (two) times daily. [name], MD Taking Active   pravastatin (PRAVACHOL) 20 MG tablet [id number] Yes Take 1 tablet (20 mg total) by mouth daily. [name], MD Taking Active Self  sildenafil (REVATIO) 20 MG tablet [id number] Yes TAKE 1 TABLET THREE TIMES A DAY ( GENERIC FOR REVATIO ) Bensimhon, [name], MD Taking Active   spironolactone (ALDACTONE) 25 MG tablet [id number] Yes Take 1/2 (one-half) tablet by mouth once daily [name], MD Taking Active   torsemide (DEMADEX) 20 MG tablet [id number] Yes TAKE 4 TABLETS BY MOUTH TWICE DAILY [name], MD Taking Active   treprostinil (REMODULIN) 5 MG/ML SOLN injection [id number] Yes See admin instructions. As of 08/09/2019: Add 7 ml of Remodulin to cassette and 93 ml of sterile diluent for Remodulin to cassetet for a total volume of 100 ml to make a concentration of 350,000 ng per ml.  Infuse via a CADD pump intravenously at a rate of 40 ml per 24 hours. Based on a dosing weight of 120 kg, the dose is 81 ng per kg per min.  Once opened, discard Remodulin vial after 30 days.  Sterile Diluent for Remodulin vials are single use only. Once mixed, discard cassette after 48 hours. [provider] Taking Active Self           Med Note Ralene Cork Dec 17, 2019 12:50 PM) Continuous infusion 12/17/19  vitamin B-12 (CYANOCOBALAMIN) 500 MCG tablet [id number] Yes Take 500 mcg by mouth daily. [provider] Taking Active Self  XARELTO 20 MG TABS tablet [id number] Yes TAKE 1 TABLET BY MOUTH ONCE DAILY WITH SUPPER [name], MD Taking Active           Patient Active Problem List   Diagnosis Date Noted  . Internal hemorrhoid 01/11/2020  . External hemorrhoid 01/11/2020  . Fibroid, uterine 11/07/2019   . Vagina bleeding 10/15/2019  . 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 Bleeding from the genitourinary system 09/13/2019  . Gross hematuria 09/13/2019  . Chronic venous stasis 05/07/2019  . Urinary incontinence 12/12/2018  . 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 Acute on chronic respiratory failure ([place]) 06/15/2018  . Right ovarian cyst 05/19/2017  . Peripheral neuropathy ([place]) 09/27/2014  . Healthcare maintenance 09/27/2014  . Acute on chronic diastolic (congestive) heart failure ([place]) 06/25/2013  . Atrial flutter ([place]) 08/17/2012  . Pulmonary hypertension ([place]) 08/16/2012  . Right heart failure, NYHA class 3 ([place]) 03/15/2012  . Allergic rhinitis 11/16/2011  . Diabetes ([place]) 10/15/2010  . Hyperlipidemia 10/15/2010  . Obesity 10/14/2010  . Obstructive sleep apnea 02/21/2009  . GERD 11/29/2005    Conditions to be addressed/monitored per PCP order:  CHF and COPD  Care Plan : Heart Failure (Adult)  Updates made by Melissa Montane, RN since 04/15/2020 12:00 AM    Problem: Symtom Management     Long-Range Goal: Symptom  Exacerbation Prevented or Minimized   Start Date: 12/11/2019  Expected End Date: 05/20/2020  This Visit's Progress: On track  Recent Progress: On track  Priority: High  Note:   Current Barriers:  . Chronic Disease Management support and education needs related management of multiple chronic diagnosis; CHF, COPD, OSA and T2DM. Today [name] reports having episodes of dizziness. This is a new symptom for her. She has not notified her provider. Provider notified, labs collected. Hgb down to 10.8 from 13.2. Patient reports that she has not been taking iron(d/c by physician) and she has not been eating a healthy diet. Dizziness has improved with resting and healthy diet. Discussed recent move with [name]. She recently moved into a studio apartment. She is no longer living with her son, he does continue to pick up her medications. She  has a bus stop 1.5 blocks away and knows that she can use medical transportation for appointments. She is adjusting to living on her own. [place] social connections . Does not contact provider office for questions/concerns  Nurse Case Manager Clinical Goal(s):  . patient will verbalize understanding of plan for management of new symptoms . patient will attend all scheduled medical appointments: Pulmonology 02/26/20 and PCP 04/30/20 . patient will demonstrate improved health management independence as evidenced by reporting new symptoms or concerns to provider-Met  Interventions:  . Inter-disciplinary care team collaboration (see longitudinal plan of care) . Discussed plans with patient for ongoing care management follow up and provided patient with direct contact information for care management team . Advised Faith Goupil to include more iron rich foods in her diet, education material to be mailed . Reviewed upcoming appointments  . Therapeutic listening . Encouraged patient to contact RNCM if she has any barriers to living on her own or managing her healthcare  Patient Goals/Self-Care Activities Over the next 30 days, patient will: - begin a heart failure diary - eat more whole grains, fruits and vegetables, lean meats and healthy fats - know when to call the doctor - track symptoms and what helps feel better or worse  -Self administers medications as prescribed  -Calls pharmacy for medication refills - Calls provider office for new concerns or questions - Incorporate more iron rich foods into your diet - increase activity, doing light exercises 2-3 times a week  Follow Up Plan: Telephone follow up appointment with Managed Medicaid care management team member scheduled for:05/20/20 @ 2:30pm      Follow Up:  Patient agrees to Care Plan and Follow-up.  Plan: The Managed Medicaid care management team will reach out to the patient again over the next 30 days. and The patient has been  provided with contact information for the Managed Medicaid care management team and has been advised to call with any health related questions or concerns.  Date/time of next scheduled RN care management/care coordination outreach:  05/20/20  [name] RN, BSN [place] RN Care Coordinator

## 2020-04-24 ENCOUNTER — Other Ambulatory Visit: Payer: Self-pay

## 2020-04-24 NOTE — Patient Outreach (Addendum)
Medicaid Managed Care    Pharmacy Note  04/24/2020 Name: [name] MRN: [id number] DOB: [birth year]  [name] is a 60 y.o. year old female who is a primary care patient of [name], MD. [facility] Managed Care Coordination team was consulted for assistance with disease management and care coordination needs.    Engaged with patient Engaged with patient by telephone for follow up visit in response to referral for case management and/or care coordination services.  [name] was given information about Managed Medicaid Care Coordination team services today. [name] agreed to services and verbal consent obtained.   Objective:  Lab Results  Component Value Date   CREATININE 0.77 12/13/2019   CREATININE 0.77 08/21/2019   CREATININE 0.59 08/12/2019    Lab Results  Component Value Date   HGBA1C 5.5 04/09/2019       Component Value Date/Time   CHOL 153 11/08/2016 1610   TRIG 107 11/08/2016 1610   HDL 47 11/08/2016 1610   CHOLHDL 3.3 11/08/2016 1610   CHOLHDL 3.5 07/22/2014 1517   VLDL 24 07/22/2014 1517   LDLCALC 85 11/08/2016 1610    Other: (TSH, CBC, Vit D, etc.)  Clinical ASCVD: Yes  The ASCVD Risk score Mikey Bussing DC Jr., et al., 2013) failed to calculate for the following reasons:   The patient has a prior MI or stroke diagnosis    Other: (CHADS2VASc if Afib, PHQ9 if depression, MMRC or CAT for COPD, ACT, DEXA)  BP Readings from Last 3 Encounters:  03/26/20 124/80  12/20/19 138/72  12/13/19 128/86    Assessment/Interventions: Review of patient past medical history, allergies, medications, health status, including review of consultants reports, laboratory and other test data, was performed as part of comprehensive evaluation and provision of chronic care management services.   Cardio: Spironolactone 60m Torsemide 841mBID KCl 2082mXarelto 29m23man: At goal,  patient stable/ symptoms controlled   Lipids: Pravastatin 29mg69mn:  Patient on low intensity statin but LDL below 100. Will ask to increase to High intensity due to risk factors  Pre-DM Metformin 250mg 36m: At goal,  patient stable/ symptoms controlled    SDOH (Social Determinants of Health) assessments and interventions performed:    Care Plan  Allergies  Allergen Reactions  . Aspirin Swelling    REACTION: airway swelling  . Codeine Other (See Comments)    REACTION: tingling in lips and hard breathing - had reaction at dentist - states "I can't take certain kinds of codeine" - happened maybe 10 yr ago  . Lisinopril Cough  . Sulfonamide Derivatives Swelling    REACTION: airway swelling  . Latex Rash    Medications Reviewed Today    Reviewed by OlalerLaurin CoderPhysician) on 03/26/20 at 2117  Med List Status: <None>  Medication Order Taking? Sig Documenting Provider Last Dose Status Informant  ACCU-CHEK AVIVA PLUS test strip [id number] 1 STRIP TO CHECK GLUCOSE ONCE DAILY Winfrey, WilliaJenne Paneaking Active Self  acetaminophen (TYLENOL) 500 MG tablet [id number] 1 tablet (500 mg total) by mouth every 6 (six) hours as needed.  Patient taking differently: Take 500 mg by mouth every 6 (six) hours as needed (pain).   SaraiyBurgess Estelleaking Active   benzonatate (TESSALON) 200 MG capsule [id number] 1 capsule (200 mg total) by mouth 3 (three) times daily as needed for cough. OlalerLaurin CoderActive   cetirizine (EQ ALLERGY RELIEF, CETIRIZINE,) 10 MG tablet  [id number] Yes Take 1 tablet (10 mg total) by mouth daily. [name], [name], DO Taking Active   clobetasol ointment (TEMOVATE) 0.05 % [id number] Yes Apply 1 application topically as directed. Apply to vulva: qhs x 4wks & then every other night x 4wks, then 2x/week  Patient taking differently: Apply 1 application topically at bedtime.   [name], MD Taking Active   cycloSPORINE (RESTASIS) 0.05 % ophthalmic emulsion [id number] Yes Place 1 drop into both eyes 2  (two) times daily as needed (dry eyes). [provider] Taking Active Self  diphenhydrAMINE (BENADRYL) 25 MG tablet [id number] Yes Take 50-100 mg by mouth 2 (two) times daily as needed for itching. [provider] Taking Active Self  FIBER PO [id number] Yes Take by mouth. [provider] Taking Active   fluticasone (FLONASE) 50 MCG/ACT nasal spray [id number] Yes Place 1 spray into both nostrils daily.  Patient taking differently: Place 1 spray into both nostrils daily as needed for allergies.   [name], MD Taking Active   gabapentin (NEURONTIN) 300 MG capsule [id number] Yes TAKE 2 CAPSULES BY MOUTH THREE TIMES DAILY  Patient taking differently: Take 600 mg by mouth 3 (three) times daily.   [name], MD Taking Active   metFORMIN (GLUCOPHAGE) 500 MG tablet [id number] Yes TAKE 1/2 (ONE-HALF) TABLET BY MOUTH WITH Sharion Dove, MD Taking Active   Na Sulfate-K Sulfate-Mg Sulf (SUPREP BOWEL PREP KIT) 17.5-3.13-1.6 GM/177ML SOLN [id number] Yes Take 1 kit by mouth as directed. [name], MD Taking Active   OXYGEN [id number] Yes Inhale 4 L into the lungs continuous. [provider] Taking Active Self  pantoprazole (PROTONIX) 20 MG tablet [id number] Yes Take 1 tablet (20 mg total) by mouth 2 (two) times daily. [name], MD Taking Active Self  potassium chloride SA (KLOR-CON M20) 20 MEQ tablet [id number] Yes Take 2 tablets (40 mEq total) by mouth 2 (two) times daily. [name], MD Taking Active   pravastatin (PRAVACHOL) 20 MG tablet [id number] Yes Take 1 tablet (20 mg total) by mouth daily. [name], MD Taking Active Self  sildenafil (REVATIO) 20 MG tablet [id number] Yes TAKE 1 TABLET THREE TIMES A DAY ( GENERIC FOR REVATIO ) Bensimhon, [name], MD Taking Active   spironolactone (ALDACTONE) 25 MG tablet [id number] Yes Take 1/2 (one-half) tablet by mouth once daily [name], MD Taking Active   torsemide  (DEMADEX) 20 MG tablet [id number] Yes TAKE 4 TABLETS BY MOUTH TWICE DAILY [name], MD Taking Active   treprostinil (REMODULIN) 5 MG/ML SOLN injection [id number] Yes See admin instructions. As of 08/09/2019: Add 7 ml of Remodulin to cassette and 93 ml of sterile diluent for Remodulin to cassetet for a total volume of 100 ml to make a concentration of 350,000 ng per ml.  Infuse via a CADD pump intravenously at a rate of 40 ml per 24 hours. Based on a dosing weight of 120 kg, the dose is 81 ng per kg per min.  Once opened, discard Remodulin vial after 30 days.  Sterile Diluent for Remodulin vials are single use only. Once mixed, discard cassette after 48 hours. [provider] Taking Active Self           Med Note Ralene Cork Dec 17, 2019 12:50 PM) Continuous infusion 12/17/19  vitamin B-12 (CYANOCOBALAMIN) 500 MCG tablet [id number] Yes Take 500 mcg by mouth daily. [provider] Taking Active Self  XARELTO 20 MG TABS tablet [id number]  Yes TAKE 1 TABLET BY MOUTH ONCE DAILY WITH SUPPER [name], MD Taking Active           Patient Active Problem List   Diagnosis Date Noted  . Internal hemorrhoid 01/11/2020  . External hemorrhoid 01/11/2020  . Fibroid, uterine 11/07/2019  . Vagina bleeding 10/15/2019  . 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 Bleeding from the genitourinary system 09/13/2019  . Gross hematuria 09/13/2019  . Chronic venous stasis 05/07/2019  . Urinary incontinence 12/12/2018  . 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 Acute on chronic respiratory failure ([place]) 06/15/2018  . Right ovarian cyst 05/19/2017  . Peripheral neuropathy ([place]) 09/27/2014  . Healthcare maintenance 09/27/2014  . Acute on chronic diastolic (congestive) heart failure ([place]) 06/25/2013  . Atrial flutter ([place]) 08/17/2012  . Pulmonary hypertension ([place]) 08/16/2012  . Right heart failure, NYHA class 3 ([place]) 03/15/2012  . Allergic  rhinitis 11/16/2011  . Diabetes ([place]) 10/15/2010  . Hyperlipidemia 10/15/2010  . Obesity 10/14/2010  . Obstructive sleep apnea 02/21/2009  . GERD 11/29/2005    Conditions to be addressed/monitored: HTN and DM  Care Plan : Medication Management  Updates made by Lane Hacker, [place] since 04/03/2020 12:00 AM    Problem: Health Promotion or Disease Self-Management (General Plan of Care)     Goal: Medication Management   Note:   Current Barriers:  . Does not maintain contact with provider office . Does not contact provider office for questions/concerns .   Pharmacist Clinical Goal(s):  Marland Kitchen Over the next 30 days, patient will contact provider office for questions/concerns as evidenced notation of same in electronic health record through collaboration with PharmD and provider.  .   Interventions: . Inter-disciplinary care team collaboration (see longitudinal plan of care) . Comprehensive medication review performed; medication list updated in electronic medical record  _0 @ _1 @ _2 @ Health Maintenance  Patient Goals/Self-Care Activities . Over the next 30 days, patient will:  - take medications as prescribed collaborate with provider on medication access solutions  Follow Up Plan: The care management team will reach out to the patient again over the next 30 days.     Task: Mutually Develop and Royce Macadamia Achievement of Patient Goals   Note:   Care Management Activities:    - verbalization of feelings encouraged    Notes:      Medication Assistance: None required. Patient affirms current coverage meets needs.   Follow up: Agree  Plan: The care management team will reach out to the patient again over the next 30 days.     [place] Constable, Pharm.D., Managed Medicaid Pharmacist - [phone]

## 2020-04-24 NOTE — Patient Instructions (Signed)
Visit Information  Ms. Dilday was given information about Medicaid Managed Care team care coordination services as a part of their Healthy [facility] Medicaid benefit. [name] verbally consented to engagement with the [facility] Managed Care team.   For questions related to your Healthy [facility] health plan, please call: [phone] or visit the homepage here: [email or web address]  If you would like to schedule transportation through your Healthy [facility] plan, please call the following number at least 2 days in advance of your appointment: [phone]   Call the [place] at [phone], at any time, 24 hours a day, 7 days a week. If you are in danger or need immediate medical attention call 911.  Ms. Bogdanski - following are the goals we discussed in your visit today:  Goals Addressed   None     Please see education materials related to HTN provided as print materials.   Patient verbalizes understanding of instructions provided today.   The Managed Medicaid care management team will reach out to the patient again over the next 90 days.   [place] Constable, Pharm.D., Managed Medicaid Pharmacist [phone]   Following is a copy of your plan of care:  Patient Care Plan: Heart Failure (Adult)    Problem Identified: Symtom Management     Long-Range Goal: Symptom Exacerbation Prevented or Minimized   Start Date: 12/11/2019  Expected End Date: 05/20/2020  This Visit's Progress: On track  Recent Progress: On track  Priority: High  Note:   Current Barriers:  . Chronic Disease Management support and education needs related management of multiple chronic diagnosis; CHF, COPD, OSA and T2DM. Today Ms Feutz reports having episodes of dizziness. This is a new symptom for her. She has not notified her provider. Provider notified, labs collected. Hgb down to 10.8 from 13.2. Patient reports that she has not been taking iron(d/c by  physician) and she has not been eating a healthy diet. Dizziness has improved with resting and healthy diet. Discussed recent move with Ms. Angert. She recently moved into a studio apartment. She is no longer living with her son, he does continue to pick up her medications. She has a bus stop 1.5 blocks away and knows that she can use medical transportation for appointments. She is adjusting to living on her own. Leodis [place] social connections . Does not contact provider office for questions/concerns  Nurse Case Manager Clinical Goal(s):  . patient will verbalize understanding of plan for management of new symptoms . patient will attend all scheduled medical appointments: Pulmonology 02/26/20 and PCP 04/30/20 . patient will demonstrate improved health management independence as evidenced by reporting new symptoms or concerns to provider-Met  Interventions:  . Inter-disciplinary care team collaboration (see longitudinal plan of care) . Discussed plans with patient for ongoing care management follow up and provided patient with direct contact information for care management team . Advised Ms Kocurek to include more iron rich foods in her diet, education material to be mailed . Reviewed upcoming appointments  . Therapeutic listening . Encouraged patient to contact RNCM if she has any barriers to living on her own or managing her healthcare  Patient Goals/Self-Care Activities Over the next 30 days, patient will: - begin a heart failure diary - eat more whole grains, fruits and vegetables, lean meats and healthy fats - know when to call the doctor - track symptoms and what helps feel better or worse  -Self administers medications as prescribed  -Calls pharmacy for medication refills - Calls  provider office for new concerns or questions - Incorporate more iron rich foods into your diet - increase activity, doing light exercises 2-3 times a week  Follow Up Plan: Telephone follow up appointment with  Managed Medicaid care management team member scheduled for:05/20/20 @ 2:30pm    Patient Care Plan: Health Management    Problem Identified: Hemorrhoid Management     Goal: Plan of care for newly diagnosed hemorrhoids developed Completed 02/13/2020  Start Date: 01/11/2020  Expected End Date: 02/13/2020  Recent Progress: On track  Priority: Medium  Note:   Current Barriers:  . Care Coordination needs related to treatment of newly diagnosed hemorrhoids. Ms Valbuena has history of rectal bleeding and was recently diagnosed with internal and external hemorrhoids while on blood thinners. A referral was placed for surgical consult in December, an appointment has not been scheduled at this time. . Transportation barriers . Cognitive Deficits  Nurse Case Manager Clinical Goal(s):  Marland Kitchen Over the next 30 days, patient will verbalize understanding of plan for hemorrhoid treatment-Met-Due to Ms Molinelli's health conditions, the plan is to improve her diet, increasing fiber, green leafy vegetables, to avoid irritation to hemorrhoids . Over the next 30 days, patient will attend all scheduled medical appointments: 02/05/20 @ 10:15 at [facility] for consult-Met . Over the next 30 days, patient will work with CM team pharmacist to review medications-Met  Interventions:  . Inter-disciplinary care team collaboration (see longitudinal plan of care) . Advised patient to avoid constipation, keep bowel movements soft to avoid irritating hemorrhoids, use a sitz bath several times a day . Provided education to patient re: hemorrhoids . Reviewed medications with patient and discussed taking as directed . Collaborated with [facility] [phone] regarding referral-They had attempted to call Ms. Poth on 12/24/19. Today we scheduled a consultation on 02/05/20 @ 10:15am . Pharmacy referral for medication review  Patient Goals/Self-Care Activities Over the next 30 days, patient will: -Self administers  medications as prescribed -Attends all scheduled provider appointments -Calls pharmacy for medication refills -Calls provider office for new concerns or questions -Avoid constipation or irritation of hemorrhoids -Utilize medical transportation as needed, provided by [employer] [phone]  Follow Up Plan:  No further follow up required      Patient Care Plan: Medication Management    Problem Identified: Health Promotion or Disease Self-Management (General Plan of Care)     Goal: Medication Management   Note:   Current Barriers:  . Does not maintain contact with provider office . Does not contact provider office for questions/concerns .   Pharmacist Clinical Goal(s):  Marland Kitchen Over the next 30 days, patient will contact provider office for questions/concerns as evidenced notation of same in electronic health record through collaboration with PharmD and provider.  .   Interventions: . Inter-disciplinary care team collaboration (see longitudinal plan of care) . Comprehensive medication review performed; medication list updated in electronic medical record  _0 @ _1 @ _2 @ Health Maintenance  Patient Goals/Self-Care Activities . Over the next 30 days, patient will:  - take medications as prescribed collaborate with provider on medication access solutions  Follow Up Plan: The care management team will reach out to the patient again over the next 30 days.     Task: Mutually Develop and Royce Macadamia Achievement of Patient Goals   Note:   Care Management Activities:    - verbalization of feelings encouraged    Notes:

## 2020-04-30 ENCOUNTER — Encounter: Payer: Medicaid Other | Admitting: Student

## 2020-05-02 ENCOUNTER — Other Ambulatory Visit (HOSPITAL_COMMUNITY): Payer: Self-pay

## 2020-05-02 DIAGNOSIS — I270 Primary pulmonary hypertension: Secondary | ICD-10-CM

## 2020-05-02 MED ORDER — SILDENAFIL CITRATE 20 MG PO TABS: ORAL_TABLET | ORAL | 2 refills | Status: DC

## 2020-05-03 ENCOUNTER — Other Ambulatory Visit: Payer: Self-pay | Admitting: Internal Medicine

## 2020-05-04 DIAGNOSIS — G4730 Sleep apnea, unspecified: Secondary | ICD-10-CM | POA: Diagnosis not present

## 2020-05-04 DIAGNOSIS — J984 Other disorders of lung: Secondary | ICD-10-CM | POA: Diagnosis not present

## 2020-05-04 DIAGNOSIS — I270 Primary pulmonary hypertension: Secondary | ICD-10-CM | POA: Diagnosis not present

## 2020-05-04 DIAGNOSIS — I5030 Unspecified diastolic (congestive) heart failure: Secondary | ICD-10-CM | POA: Diagnosis not present

## 2020-05-05 ENCOUNTER — Ambulatory Visit: Payer: Medicaid Other | Admitting: Student

## 2020-05-05 VITALS — BP 129/71 | HR 88 | Temp 98.4°F | Wt 280.8 lb

## 2020-05-05 DIAGNOSIS — M5489 Other dorsalgia: Secondary | ICD-10-CM

## 2020-05-05 DIAGNOSIS — E1142 Type 2 diabetes mellitus with diabetic polyneuropathy: Secondary | ICD-10-CM | POA: Diagnosis not present

## 2020-05-05 DIAGNOSIS — I5033 Acute on chronic diastolic (congestive) heart failure: Secondary | ICD-10-CM | POA: Diagnosis not present

## 2020-05-05 DIAGNOSIS — J069 Acute upper respiratory infection, unspecified: Secondary | ICD-10-CM | POA: Diagnosis not present

## 2020-05-05 DIAGNOSIS — J309 Allergic rhinitis, unspecified: Secondary | ICD-10-CM | POA: Diagnosis not present

## 2020-05-05 DIAGNOSIS — K219 Gastro-esophageal reflux disease without esophagitis: Secondary | ICD-10-CM

## 2020-05-05 DIAGNOSIS — H9193 Unspecified hearing loss, bilateral: Secondary | ICD-10-CM

## 2020-05-05 DIAGNOSIS — E785 Hyperlipidemia, unspecified: Secondary | ICD-10-CM

## 2020-05-05 LAB — POCT GLYCOSYLATED HEMOGLOBIN (HGB A1C): Hemoglobin A1C: 5.6 % (ref 4.0–5.6)

## 2020-05-05 LAB — GLUCOSE, CAPILLARY: Glucose-Capillary: 99 mg/dL (ref 70–99)

## 2020-05-05 MED ORDER — FLUTICASONE PROPIONATE 50 MCG/ACT NA SUSP
1.0000 | Freq: Every day | NASAL | 1 refills | Status: DC | PRN
Start: 2020-05-05 — End: 2020-05-13

## 2020-05-05 MED ORDER — POTASSIUM CHLORIDE CRYS ER 20 MEQ PO TBCR: 40.0000 meq | EXTENDED_RELEASE_TABLET | Freq: Two times a day (BID) | ORAL | 0 refills | Status: DC

## 2020-05-05 MED ORDER — CETIRIZINE HCL 10 MG PO TABS: 10.0000 mg | ORAL_TABLET | Freq: Every day | ORAL | 1 refills | Status: DC

## 2020-05-05 MED ORDER — PANTOPRAZOLE SODIUM 20 MG PO TBEC: 20.0000 mg | DELAYED_RELEASE_TABLET | Freq: Two times a day (BID) | ORAL | 0 refills | Status: DC

## 2020-05-05 NOTE — Assessment & Plan Note (Signed)
Patient with history of diabetes that is very well controlled.  Currently takes metformin 250 mg daily.  Hemoglobin A1c today at 5.6%.  We will continue current regimen (as per patient's wishes) and monitor yearly hemoglobin A1c.

## 2020-05-05 NOTE — Progress Notes (Signed)
CC: left lower back pain  HPI:  [name] is a 60 y.o. female with history listed below presenting to the [facility] for left lower back pain. Please see individualized problem based charting for full HPI.  Past Medical History:  Diagnosis Date  . Anemia, iron deficiency    secondary to menhorrhagia, on oral iron, also b12 def, getting monthly b12 shots  . Cellulitis 05/2019   RIGHT LOWER EXTREMITY  . CHF (congestive heart failure) ([place])   . Chronic cough    secondary to alleriges and post nasal drip  . Cognitive impairment   . COPD (chronic obstructive pulmonary disease) ([place])   . Cor pulmonale (HCC)    PA Peak pressure 35mHg  . Depression   . Diabetes mellitus    well controlled on metformin  . Diastolic heart failure ([place]   . GERD (gastroesophageal reflux disease)   . H/O mental retardation   . Herpes   . Hyperlipidemia   . Hypertension   . OSA (obstructive sleep apnea)    CPAP  . Pulmonary hypertension ([place]   . Renal disorder   . Restrictive lung disease    PFTs 06/2012 (FVC 54% predicted and FEV1 68% predicted w minimal bronchodilator response).  . Shortness of breath   . Venous stasis ulcer ([place]    chornic, ?followed up at wound care center, multiple courses of antibiotics in past for cellulitis, on lasix    Review of Systems:  Negative aside from that listed in individualized problem based charting.  Physical Exam:  Vitals:   05/05/20 0901  Pulse: 88  SpO2: 95%  Weight: 280 lb 12.8 oz (127.4 kg)   Physical Exam Constitutional:      Appearance: She is obese. She is not ill-appearing.  HENT:     Right Ear: Tympanic membrane, ear canal and external ear normal.     Left Ear: Tympanic membrane, ear canal and external ear normal.     Nose: Nose normal. No congestion.     Mouth/Throat:     Mouth: Mucous membranes are moist.     Pharynx: Oropharynx is clear.  Eyes:     Extraocular Movements: Extraocular movements intact.     Conjunctiva/sclera:  Conjunctivae normal.     Pupils: Pupils are equal, round, and reactive to light.  Cardiovascular:     Rate and Rhythm: Normal rate and regular rhythm.     Pulses: Normal pulses.     Heart sounds: Normal heart sounds. No murmur heard. No friction rub. No gallop.   Pulmonary:     Effort: Pulmonary effort is normal.     Breath sounds: Normal breath sounds.     Comments: On 4L [place] chronically Abdominal:     General: Bowel sounds are normal. There is no distension.     Palpations: Abdomen is soft.     Tenderness: There is no abdominal tenderness.  Musculoskeletal:        General: Normal range of motion.     Comments: Mild left lower back paraspinal tenderness to palpation. No midline tenderness or radiation of pain.  Skin:    General: Skin is warm and dry.  Neurological:     General: No focal deficit present.     Mental Status: She is alert and oriented to person, place, and time.  Psychiatric:        Mood and Affect: Mood normal.        Behavior: Behavior normal.      Assessment & Plan:  See Encounters Tab for problem based charting.  Patient discussed with Dr. Dareen Piano

## 2020-05-05 NOTE — Assessment & Plan Note (Signed)
Patient complaining of left lower back pain that began 3 days ago.  She reports typically sitting in the love seat at home but notes that this does not provide good low back support.  Furthermore, she realized that her pain began when she started to sleep on her left side on her bed.  She mostly experiences this pain when trying to get up out of the chair or out of bed and this subsides after some time.  She does not have any alarm features and the pain is not midline.  Appears to be musculoskeletal in nature.  Discussed using a heating pad along with using pillows for low back support when sitting on the chair.  Also discussed using Tylenol as needed for pain control.  I expect this pain to resolve with supportive measures. -Heating pad, low back support, Tylenol as needed for pain control

## 2020-05-05 NOTE — Patient Instructions (Signed)
[name],  It was a pleasure seeing you in the clinic today.   1. As we discussed, for your low back pain, try using a heating pad and tylenol as needed. Also, try putting something for low back support (pillow, etc) when you are sitting in the chair.  2. I have refilled your medications.  3. I have referred you to audiology to evaluate your hearing.  Please call our clinic at [phone] if you have any questions or concerns. The best time to call is Monday-Friday from 9am-4pm, but there is someone available 24/7 at the same number. If you need medication refills, please notify your pharmacy one week in advance and they will send [place] a request.   Thank you for letting us take part in your care. We look forward to seeing you next time!

## 2020-05-05 NOTE — Assessment & Plan Note (Signed)
Patient with history of hyperlipidemia, on pravastatin 20 mg daily.  Last lipid panel was in 2018 and she is due for another.  -Follow-up lipid panel

## 2020-05-05 NOTE — Assessment & Plan Note (Signed)
Patient notes having more trouble with hearing, especially during telephone conversations.  States that it is hard for her to hear when people whisper or talk softly.  States that it is the same in both ears.  No recent trauma to the ear or exposure to loud sounds.  Ear exam is grossly benign with no cerumen, blood, trauma to the external ear canal or eardrum noted.  Rinne and Weber tests with no overt abnormalities or laterality seen.  Will refer to audiology for further hearing evaluation.  Possibility that this could be presbycusis. -referral to audiology

## 2020-05-06 LAB — LIPID PANEL
Chol/HDL Ratio: 3.5 ratio (ref 0.0–4.4)
Cholesterol, Total: 151 mg/dL (ref 100–199)
HDL: 43 mg/dL (ref >39)
LDL Chol Calc (NIH): 86 mg/dL (ref 0–99)
Triglycerides: 124 mg/dL (ref 0–149)
VLDL Cholesterol Cal: 22 mg/dL (ref 5–40)

## 2020-05-08 NOTE — Progress Notes (Signed)
Internal Medicine Clinic Attending  Case discussed with Dr. Jinwala  At the time of the visit.  We reviewed the resident's history and exam and pertinent patient test results.  I agree with the assessment, diagnosis, and plan of care documented in the resident's note.  

## 2020-05-13 ENCOUNTER — Other Ambulatory Visit: Payer: Self-pay

## 2020-05-13 DIAGNOSIS — J069 Acute upper respiratory infection, unspecified: Secondary | ICD-10-CM

## 2020-05-13 DIAGNOSIS — K219 Gastro-esophageal reflux disease without esophagitis: Secondary | ICD-10-CM

## 2020-05-13 DIAGNOSIS — I5033 Acute on chronic diastolic (congestive) heart failure: Secondary | ICD-10-CM

## 2020-05-13 DIAGNOSIS — J309 Allergic rhinitis, unspecified: Secondary | ICD-10-CM

## 2020-05-13 MED ORDER — CETIRIZINE HCL 10 MG PO TABS: 10.0000 mg | ORAL_TABLET | Freq: Every day | ORAL | 1 refills | Status: DC

## 2020-05-13 MED ORDER — PANTOPRAZOLE SODIUM 20 MG PO TBEC: 20.0000 mg | DELAYED_RELEASE_TABLET | Freq: Two times a day (BID) | ORAL | 0 refills | Status: DC

## 2020-05-13 MED ORDER — POTASSIUM CHLORIDE CRYS ER 20 MEQ PO TBCR
40.0000 meq | EXTENDED_RELEASE_TABLET | Freq: Two times a day (BID) | ORAL | 0 refills | Status: DC
Start: 2020-05-13 — End: 2020-10-14

## 2020-05-13 MED ORDER — FLUTICASONE PROPIONATE 50 MCG/ACT NA SUSP: 1.0000 | Freq: Every day | NASAL | 1 refills | Status: AC | PRN

## 2020-05-13 MED ORDER — RIVAROXABAN 20 MG PO TABS: 20.0000 mg | ORAL_TABLET | Freq: Every day | ORAL | 0 refills | Status: DC

## 2020-05-13 NOTE — Telephone Encounter (Signed)
Ole with [facility] states Dr. Allyson Sabal is not enrolled in Medicaid, so the insurance will not covered the meds. Requesting another provider to send in the meds.

## 2020-05-13 NOTE — Telephone Encounter (Signed)
Patient was seen on 05/05/20 by dr. Allyson Sabal and had several medications filled.  Since Dr. Guinevere Scarlet number will not go through, will send this refill request to red team and attending pool for refill consideration. Thank you, SChaplin, RN,BSN

## 2020-05-19 ENCOUNTER — Other Ambulatory Visit: Payer: Self-pay | Admitting: *Deleted

## 2020-05-19 NOTE — Patient Instructions (Signed)
Visit Information  [name]  - as a part of your Medicaid benefit, you are eligible for care management and care coordination services at no cost or copay. I was unable to reach you by phone today but would be happy to help you with your health related needs. Please feel free to call me @ [phone].   A member of the Managed Medicaid care management team will reach out to you again over the next 7-14 days.   [name] RN, BSN   [employer] manager

## 2020-05-19 NOTE — Patient Outreach (Signed)
Care Coordination  05/19/2020  SHEALA DOSH 05/31/1960 [id number]    Medicaid Managed Care   Unsuccessful Outreach Note  05/19/2020 Name: FIDELIS LOTH MRN: [id number] DOB: Dec 06, 1960  Referred by: [name], MD Reason for referral : High Risk Managed Medicaid (Unsuccessful RNCM follow up outreach)   An unsuccessful telephone outreach was attempted today. The patient was referred to the case management team for assistance with care management and care coordination.   Follow Up Plan: A HIPAA compliant phone message was left for the patient providing contact information and requesting a return call.  The care management team will reach out to the patient again over the next 7-14 days.   [name] RN, BSN [place]  [employer] manager

## 2020-06-04 ENCOUNTER — Other Ambulatory Visit: Payer: Self-pay | Admitting: Internal Medicine

## 2020-06-04 DIAGNOSIS — I5030 Unspecified diastolic (congestive) heart failure: Secondary | ICD-10-CM | POA: Diagnosis not present

## 2020-06-04 DIAGNOSIS — H9313 Tinnitus, bilateral: Secondary | ICD-10-CM | POA: Diagnosis not present

## 2020-06-04 DIAGNOSIS — J984 Other disorders of lung: Secondary | ICD-10-CM | POA: Diagnosis not present

## 2020-06-04 DIAGNOSIS — H903 Sensorineural hearing loss, bilateral: Secondary | ICD-10-CM | POA: Diagnosis not present

## 2020-06-04 DIAGNOSIS — I270 Primary pulmonary hypertension: Secondary | ICD-10-CM | POA: Diagnosis not present

## 2020-06-04 DIAGNOSIS — G4730 Sleep apnea, unspecified: Secondary | ICD-10-CM | POA: Diagnosis not present

## 2020-06-08 ENCOUNTER — Encounter: Payer: Self-pay | Admitting: *Deleted

## 2020-06-16 ENCOUNTER — Emergency Department (HOSPITAL_COMMUNITY)
Admission: EM | Admit: 2020-06-16 | Discharge: 2020-06-16 | Disposition: A | Discharge status: Home / Self Care | Payer: Medicaid Other | Attending: Emergency Medicine | Admitting: Emergency Medicine

## 2020-06-16 ENCOUNTER — Other Ambulatory Visit: Payer: Self-pay

## 2020-06-16 ENCOUNTER — Encounter (HOSPITAL_COMMUNITY): Payer: Self-pay

## 2020-06-16 DIAGNOSIS — Z91040 Latex allergy status: Secondary | ICD-10-CM | POA: Insufficient documentation

## 2020-06-16 DIAGNOSIS — E119 Type 2 diabetes mellitus without complications: Secondary | ICD-10-CM | POA: Insufficient documentation

## 2020-06-16 DIAGNOSIS — R0989 Other specified symptoms and signs involving the circulatory and respiratory systems: Secondary | ICD-10-CM | POA: Insufficient documentation

## 2020-06-16 DIAGNOSIS — Z7984 Long term (current) use of oral hypoglycemic drugs: Secondary | ICD-10-CM | POA: Insufficient documentation

## 2020-06-16 DIAGNOSIS — Z79899 Other long term (current) drug therapy: Secondary | ICD-10-CM | POA: Insufficient documentation

## 2020-06-16 DIAGNOSIS — J449 Chronic obstructive pulmonary disease, unspecified: Secondary | ICD-10-CM | POA: Diagnosis not present

## 2020-06-16 DIAGNOSIS — I5033 Acute on chronic diastolic (congestive) heart failure: Secondary | ICD-10-CM | POA: Diagnosis not present

## 2020-06-16 DIAGNOSIS — Z760 Encounter for issue of repeat prescription: Secondary | ICD-10-CM

## 2020-06-16 DIAGNOSIS — I110 Hypertensive heart disease with heart failure: Secondary | ICD-10-CM | POA: Diagnosis not present

## 2020-06-16 DIAGNOSIS — Z7901 Long term (current) use of anticoagulants: Secondary | ICD-10-CM | POA: Insufficient documentation

## 2020-06-16 MED ORDER — TREPROSTINIL 100 MG/20ML IJ SOLN
81.0000 ng/kg/min | INTRAVENOUS | Status: DC
  Administered 2020-06-16: 81 ng/kg/min via INTRAVENOUS
  Filled 2020-06-16: qty 7

## 2020-06-16 NOTE — ED Provider Notes (Signed)
[facility] EMERGENCY DEPARTMENT Provider Note   CSN: [id number] Arrival date & time: 06/16/20  2044     History Chief Complaint  Patient presents with   Medication Refill    [name] is a 60 y.o. female.  The history is provided by the patient.  Medication Refill Medications/supplies requested:  Remodulin Reason for request:  Medications ran out Medications taken before: yes - see home medications   Patient has complete information: unknown.       Past Medical History:  Diagnosis Date   Anemia, iron deficiency    secondary to menhorrhagia, on oral iron, also b12 def, getting monthly b12 shots   Cellulitis 05/2019   RIGHT LOWER EXTREMITY   CHF (congestive heart failure) (HCC)    Chronic cough    secondary to alleriges and post nasal drip   Cognitive impairment    COPD (chronic obstructive pulmonary disease) (HCC)    Cor pulmonale (HCC)    PA Peak pressure 61mHg   Depression    Diabetes mellitus    well controlled on metformin   Diastolic heart failure (HCC)    GERD (gastroesophageal reflux disease)    H/O mental retardation    Herpes    Hyperlipidemia    Hypertension    OSA (obstructive sleep apnea)    CPAP   Pulmonary hypertension ([place]    Renal disorder    Restrictive lung disease    PFTs 06/2012 (FVC 54% predicted and FEV1 68% predicted w minimal bronchodilator response).   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   Bleeding from the genitourinary system 09/13/2019   Gross hematuria 09/13/2019   Chronic  venous stasis 05/07/2019   Urinary incontinence 12/12/2018   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[place]    Acute on chronic respiratory failure (HH[place] 06/15/2018   Right ovarian cyst 05/19/2017   Peripheral neuropathy ([place] 09/27/2014   Healthcare maintenance 09/27/2014   Acute on chronic diastolic (congestive) heart failure (HS[place] 06/25/2013   Atrial flutter (HL[place] 08/17/2012   Pulmonary hypertension (HC[place] 08/16/2012   Right heart failure, NYHA class 3 ([place] 03/15/2012   Allergic rhinitis 11/16/2011   Diabetes (HT[place] 10/15/2010   Hyperlipidemia 10/15/2010   Obesity 10/14/2010   Obstructive sleep apnea 02/21/2009   GERD 11/29/2005    Past Surgical History:  Procedure Laterality Date   CARDIAC CATHETERIZATION N/A 01/13/2016   Procedure: Right Heart Cath;  Surgeon: DJolaine Artist MD;  [facility];  Service: Cardiovascular;  Laterality: N/A;   CHOLECYSTECTOMY     COLONOSCOPY WITH PROPOFOL N/A 12/20/2019   Procedure: COLONOSCOPY WITH PROPOFOL;  Surgeon: JMilus Banister MD;  Location: [facility];  Service: Endoscopy;  Laterality: N/A;   IR FLUORO GUIDE CV LINE LEFT  11/16/2019   IR FLUORO GUIDE CV LINE RIGHT  05/09/2019   IR FLUORO GUIDE CV LINE RIGHT  08/12/2019   IR REMOVAL TUN CV CATH W/O FL  05/05/2019   IR [place] ACCESS LEFT  11/16/2019   IR [place] VASC  ACCESS RIGHT  05/09/2019   IR US GUIDE VASC ACCESS RIGHT  08/12/2019   IR US GUIDE VASC ACCESS RIGHT  11/16/2019   IR VENO/JUGULAR RIGHT  11/16/2019   LEFT AND RIGHT HEART CATHETERIZATION WITH CORONARY ANGIOGRAM N/A 02/28/2014   Procedure: LEFT AND RIGHT HEART CATHETERIZATION WITH CORONARY ANGIOGRAM;  Surgeon: Jolaine Artist, MD;  Location: [facility] CATH LAB;  Service: Cardiovascular;  Laterality: N/A;   RIGHT HEART CATH N/A 06/27/2018   Procedure: RIGHT HEART CATH;  Surgeon: Jolaine Artist, MD;  Location: [place] CV LAB;  Service: Cardiovascular;  Laterality: N/A;   RIGHT/LEFT  HEART CATH AND CORONARY ANGIOGRAPHY N/A 05/14/2019   Procedure: RIGHT/LEFT HEART CATH AND CORONARY ANGIOGRAPHY;  Surgeon: Jolaine Artist, MD;  Location: [place] CV LAB;  Service: Cardiovascular;  Laterality: N/A;     OB History     Gravida  1   Para  1   Term  1   Preterm      AB      Living  1      SAB      IAB      Ectopic      Multiple      Live Births              Family History  Problem Relation Age of Onset   Kidney Stones Son    Mental illness Sister    Bipolar disorder Sister    Mental retardation Brother    Hyperlipidemia Mother    Breast cancer Maternal Aunt    Colon cancer Neg Hx    Pancreatic cancer Neg Hx    Esophageal cancer Neg Hx     Social History   Tobacco Use   Smoking status: Never   Smokeless tobacco: Never  Vaping Use   Vaping Use: Never used  Substance Use Topics   Alcohol use: No    Alcohol/week: 0.0 standard drinks   Drug use: No    Home Medications Prior to Admission medications   Medication Sig Start Date End Date Taking? Authorizing Provider  ACCU-CHEK AVIVA PLUS test strip USE 1 STRIP TO CHECK GLUCOSE ONCE DAILY 05/25/18   [name], MD  acetaminophen (TYLENOL) 500 MG tablet Take 1 tablet (500 mg total) by mouth every 6 (six) hours as needed. Patient taking differently: Take 500 mg by mouth every 6 (six) hours as needed (pain). 08/21/15   [name], MD  benzonatate (TESSALON) 200 MG capsule Take 1 capsule (200 mg total) by mouth 3 (three) times daily as needed for cough. 03/26/20 03/26/21  Laurin Coder, MD  cetirizine (EQ ALLERGY RELIEF, CETIRIZINE,) 10 MG tablet Take 1 tablet (10 mg total) by mouth daily. 05/13/20   [name], MD  clobetasol ointment (TEMOVATE) 8.93 % Apply 1 application topically as directed. Apply to vulva: qhs x 4wks & then every other night x 4wks, then 2x/week Patient taking differently: Apply 1 application topically at bedtime. 10/15/19   [name], MD   cycloSPORINE (RESTASIS) 0.05 % ophthalmic emulsion Place 1 drop into both eyes 2 (two) times daily as needed (dry eyes).    [provider]  diphenhydrAMINE (BENADRYL) 25 MG tablet Take 50-100 mg by mouth 2 (two) times daily as needed for itching.    [provider]  FIBER PO Take by mouth.    [provider]  fluticasone (FLONASE) 50 MCG/ACT nasal spray Place 1 spray into both nostrils daily as needed for allergies. 05/13/20  05/13/21  [name], MD  gabapentin (NEURONTIN) 300 MG capsule TAKE 2 CAPSULES BY MOUTH THREE TIMES DAILY 04/01/20   [name], [name], DO  metFORMIN (GLUCOPHAGE) 500 MG tablet TAKE 1/2 (ONE-HALF) TABLET BY MOUTH WITH BREAKFAST 03/10/20   [name], MD  Na Sulfate-K Sulfate-Mg Sulf (SUPREP BOWEL PREP KIT) 17.5-3.13-1.6 GM/177ML SOLN Take 1 kit by mouth as directed. 11/23/19   [name], MD  OXYGEN Inhale 4 L into the lungs continuous.    [provider]  pantoprazole (PROTONIX) 20 MG tablet Take 1 tablet (20 mg total) by mouth 2 (two) times daily. 05/13/20   [name], MD  potassium chloride SA (KLOR-CON M20) 20 MEQ tablet Take 2 tablets (40 mEq total) by mouth 2 (two) times daily. 05/13/20   [name], MD  pravastatin (PRAVACHOL) 20 MG tablet Take 1 tablet by mouth once daily 03/28/20   [name], MD  sildenafil (REVATIO) 20 MG tablet TAKE 1 TABLET THREE TIMES A DAY ( GENERIC FOR REVATIO ) 05/02/20   Bensimhon, [name], MD  spironolactone (ALDACTONE) 25 MG tablet Take 1/2 (one-half) tablet by mouth once daily 03/28/20   [name], MD  torsemide (DEMADEX) 20 MG tablet TAKE 4 TABLETS BY MOUTH TWICE DAILY 06/05/20   [name], MD  treprostinil (REMODULIN) 5 MG/ML SOLN injection See admin instructions. As of 08/09/2019: Add 7 ml of Remodulin to cassette and 93 ml of sterile diluent for Remodulin to cassetet for a total volume of 100 ml to make a concentration of 350,000 ng per ml.  Infuse via a CADD  pump intravenously at a rate of 40 ml per 24 hours. Based on a dosing weight of 120 kg, the dose is 81 ng per kg per min.  Once opened, discard Remodulin vial after 30 days.  Sterile Diluent for Remodulin vials are single use only. Once mixed, discard cassette after 48 hours.    [provider]  vitamin B-12 (CYANOCOBALAMIN) 500 MCG tablet Take 500 mcg by mouth daily.    [provider]  XARELTO 20 MG TABS tablet TAKE 1 TABLET BY MOUTH ONCE DAILY WITH SUPPER 06/05/20   [name], MD    Allergies    Aspirin, Codeine, Lisinopril, Sulfonamide derivatives, and Latex  Review of Systems   Review of Systems  Constitutional:  Negative for chills, diaphoresis, fatigue and fever.  HENT:  Negative for congestion.   Eyes:  Negative for visual disturbance.  Respiratory:  Negative for cough, chest tightness, shortness of breath and wheezing.   Cardiovascular:  Negative for chest pain.  Gastrointestinal:  Negative for abdominal pain, constipation, diarrhea, nausea and vomiting.  Genitourinary:  Negative for dysuria and flank pain.  Musculoskeletal:  Negative for back pain.  Skin:  Negative for rash and wound.  Neurological:  Negative for light-headedness, numbness and headaches.  Psychiatric/Behavioral:  Negative for agitation and confusion.   All other systems reviewed and are negative.  Physical Exam Updated Vital Signs BP (!) 131/100 (BP Location: Right Arm)   Pulse 72   Temp 98.3 F (36.8 C) (Temporal)   Resp 20   Ht _0  (1.6 m)   Wt 127 kg   LMP 02/19/2011   SpO2 99%   BMI 49.60 kg/m   Physical Exam Vitals and nursing note reviewed.  Constitutional:      General: She is not in acute distress.    Appearance: She is well-developed. She is not ill-appearing, toxic-appearing or diaphoretic.  HENT:  Head: Normocephalic and atraumatic.     Nose: Nose normal.     Mouth/Throat:     Mouth: Mucous membranes are moist.  Eyes:     Extraocular Movements:  Extraocular movements intact.     Conjunctiva/sclera: Conjunctivae normal.     Pupils: Pupils are equal, round, and reactive to light.  Cardiovascular:     Rate and Rhythm: Normal rate and regular rhythm.     Heart sounds: No murmur heard. Pulmonary:     Effort: Pulmonary effort is normal. No respiratory distress.     Breath sounds: Rhonchi present. No wheezing or rales.  Chest:     Chest wall: No tenderness.  Abdominal:     Palpations: Abdomen is soft.     Tenderness: There is no abdominal tenderness. There is no right CVA tenderness, left CVA tenderness, guarding or rebound.  Musculoskeletal:        General: No tenderness.     Cervical back: Neck supple.  Skin:    General: Skin is warm and dry.     Capillary Refill: Capillary refill takes less than 2 seconds.     Findings: No erythema.  Neurological:     General: No focal deficit present.     Mental Status: She is alert.     Sensory: No sensory deficit.     Motor: No weakness.  Psychiatric:        Mood and Affect: Mood normal.    ED Results / Procedures / Treatments   Labs (all labs ordered are listed, but only abnormal results are displayed) Labs Reviewed - No data to display  EKG None  Radiology No results found.  Procedures Procedures   Medications Ordered in ED Medications  treprostinil (REMODULIN) 35,000,000 ng in glycine diluent for epoprostenol 100 mL (350,000 ng/mL) infusion (has no administration in time range)    ED Course  I have reviewed the triage vital signs and the nursing notes.  Pertinent labs & imaging results that were available during my care of the patient were reviewed by me and considered in my medical decision making (see chart for details).    MDM Rules/Calculators/A&P                          [name] is a 60 y.o. female with a past medical history significant for pulmonary hypertension on a continuous Remodulin infusion, heart failure,   obesity, hypertension,  hyperlipidemia, atrial fibrillation On Xarelto, and COPD who presents with needing medication refill.  Patient reports that she thinks someone stole a package off of reports that contain her Remodulin cartridges for the week.  She says that she has not been able to get 1 today and is post to get a new set tomorrow but her medication is running out tonight.  She reports that she is not having any new symptoms in regards to shortness of breath, fatigue, anterior chest pain, or any infectious symptoms.  She reports that if she had her cartridges of remodel and she would not come to the emergency department but as it is getting ready to run out, she reports that she has severe respiratory failure if the infusion is stopped.  Patient is here for medication refill.  On exam, lungs have some coarseness but she reports she is not feeling any different.  Chest is nontender, abdomen is nontender.  Patient has a port in her left upper chest that she reports has had some occasional discomfort  on and off recently but does not want new work-up on that now.  Patient reports she is only here for the medications and if she continues to feel well and has it refilled, she does not want further work-up at this time.  We had a shared decision-making conversation and agreed to have her follow-up with her primary doctor in several days and observe strict return precautions.  Pharmacy is helping refill her medication and she will be discharged after once it proves to be working.  Anticipate discharge after pharmacy management.   11:22 PM Pharmacy was able to refill her medication and is already infusing.  If there are no complication the neck several months, patient was discharged home and she understands this plan and return precautions  Final Clinical Impression(s) / ED Diagnoses Final diagnoses:  Medication refill     Clinical Impression: 1. Medication refill     Disposition: Discharge  Condition: Good  I have  discussed the results, Dx and Tx plan with the pt(& family if present). He/she/they expressed understanding and agree(s) with the plan. Discharge instructions discussed at great length. Strict return precautions discussed and pt &/or family have verbalized understanding of the instructions. No further questions at time of discharge.    New Prescriptions   No medications on file    Follow Up: tour cardiologist     [facility] [address] [id number] mc [place]       Trysta Showman, Gwenyth Allegra, MD 06/16/20 [id number]

## 2020-06-16 NOTE — ED Notes (Signed)
Pt going home with running infusion mixed by our pharmacy

## 2020-06-16 NOTE — Discharge Instructions (Signed)
Your medication was refilled tonight so that you do not go into respiratory failure as we have in the past.  Please make sure you have more medication going forward and follow-up with your regular team.  As you had no symptoms and said that you would not be here if you had medication at home, we agreed together to hold on further work-up at this time.  If any symptoms change or worsen, please return to the nearest emergency department.

## 2020-06-16 NOTE — ED Notes (Signed)
Pharmacist at bedside to review pt IV medication.

## 2020-06-16 NOTE — ED Triage Notes (Signed)
Pt reports she is going to run out of her medication which is infused in a portable IV pump. Medication is used for pulmonary hypertension. No other new symptoms.

## 2020-06-17 ENCOUNTER — Encounter (HOSPITAL_COMMUNITY): Payer: Self-pay

## 2020-06-17 ENCOUNTER — Emergency Department (HOSPITAL_COMMUNITY)
Admission: EM | Admit: 2020-06-17 | Discharge: 2020-06-17 | Disposition: A | Discharge status: Home / Self Care | Payer: Medicaid Other | Attending: Emergency Medicine | Admitting: Emergency Medicine

## 2020-06-17 ENCOUNTER — Other Ambulatory Visit: Payer: Self-pay

## 2020-06-17 ENCOUNTER — Telehealth: Payer: Self-pay | Admitting: *Deleted

## 2020-06-17 ENCOUNTER — Emergency Department (HOSPITAL_COMMUNITY): Payer: Medicaid Other

## 2020-06-17 DIAGNOSIS — Z885 Allergy status to narcotic agent status: Secondary | ICD-10-CM | POA: Insufficient documentation

## 2020-06-17 DIAGNOSIS — Z7901 Long term (current) use of anticoagulants: Secondary | ICD-10-CM | POA: Insufficient documentation

## 2020-06-17 DIAGNOSIS — Z9049 Acquired absence of other specified parts of digestive tract: Secondary | ICD-10-CM | POA: Insufficient documentation

## 2020-06-17 DIAGNOSIS — T82594A Other mechanical complication of infusion catheter, initial encounter: Secondary | ICD-10-CM | POA: Diagnosis present

## 2020-06-17 DIAGNOSIS — Z7984 Long term (current) use of oral hypoglycemic drugs: Secondary | ICD-10-CM | POA: Insufficient documentation

## 2020-06-17 DIAGNOSIS — Z9981 Dependence on supplemental oxygen: Secondary | ICD-10-CM | POA: Diagnosis not present

## 2020-06-17 DIAGNOSIS — Z882 Allergy status to sulfonamides status: Secondary | ICD-10-CM | POA: Diagnosis not present

## 2020-06-17 DIAGNOSIS — G4733 Obstructive sleep apnea (adult) (pediatric): Secondary | ICD-10-CM | POA: Insufficient documentation

## 2020-06-17 DIAGNOSIS — E1142 Type 2 diabetes mellitus with diabetic polyneuropathy: Secondary | ICD-10-CM | POA: Insufficient documentation

## 2020-06-17 DIAGNOSIS — I4892 Unspecified atrial flutter: Secondary | ICD-10-CM | POA: Insufficient documentation

## 2020-06-17 DIAGNOSIS — I5033 Acute on chronic diastolic (congestive) heart failure: Secondary | ICD-10-CM | POA: Insufficient documentation

## 2020-06-17 DIAGNOSIS — I110 Hypertensive heart disease with heart failure: Secondary | ICD-10-CM | POA: Insufficient documentation

## 2020-06-17 DIAGNOSIS — Z888 Allergy status to other drugs, medicaments and biological substances status: Secondary | ICD-10-CM | POA: Insufficient documentation

## 2020-06-17 DIAGNOSIS — I272 Pulmonary hypertension, unspecified: Secondary | ICD-10-CM | POA: Diagnosis not present

## 2020-06-17 DIAGNOSIS — Z886 Allergy status to analgesic agent status: Secondary | ICD-10-CM | POA: Diagnosis not present

## 2020-06-17 DIAGNOSIS — R600 Localized edema: Secondary | ICD-10-CM | POA: Diagnosis not present

## 2020-06-17 DIAGNOSIS — J449 Chronic obstructive pulmonary disease, unspecified: Secondary | ICD-10-CM | POA: Diagnosis not present

## 2020-06-17 DIAGNOSIS — E669 Obesity, unspecified: Secondary | ICD-10-CM | POA: Insufficient documentation

## 2020-06-17 DIAGNOSIS — Z79899 Other long term (current) drug therapy: Secondary | ICD-10-CM | POA: Insufficient documentation

## 2020-06-17 DIAGNOSIS — R69 Illness, unspecified: Secondary | ICD-10-CM | POA: Diagnosis not present

## 2020-06-17 DIAGNOSIS — I2721 Secondary pulmonary arterial hypertension: Secondary | ICD-10-CM | POA: Diagnosis not present

## 2020-06-17 DIAGNOSIS — X58XXXA Exposure to other specified factors, initial encounter: Secondary | ICD-10-CM | POA: Insufficient documentation

## 2020-06-17 DIAGNOSIS — Z95828 Presence of other vascular implants and grafts: Secondary | ICD-10-CM

## 2020-06-17 DIAGNOSIS — Z91040 Latex allergy status: Secondary | ICD-10-CM | POA: Insufficient documentation

## 2020-06-17 DIAGNOSIS — Z452 Encounter for adjustment and management of vascular access device: Secondary | ICD-10-CM | POA: Diagnosis not present

## 2020-06-17 HISTORY — PX: IR FLUORO GUIDE CV LINE LEFT: IMG2282

## 2020-06-17 HISTORY — PX: IR US GUIDE VASC ACCESS LEFT: IMG2389

## 2020-06-17 MED ORDER — LIDOCAINE-EPINEPHRINE 1 %-1:100000 IJ SOLN
INTRAMUSCULAR | Status: AC
  Administered 2020-06-17: 10 mL via SUBCUTANEOUS
  Filled 2020-06-17: qty 1

## 2020-06-17 MED ORDER — LIDOCAINE HCL (PF) 1 % IJ SOLN
INTRAMUSCULAR | Status: AC
  Filled 2020-06-17: qty 30

## 2020-06-17 MED ORDER — TREPROSTINIL 100 MG/20ML IJ SOLN
81.0000 ng/kg/min | INTRAVENOUS | Status: DC
  Administered 2020-06-17: 81 ng/kg/min via INTRAVENOUS
  Filled 2020-06-17: qty 7

## 2020-06-17 NOTE — ED Notes (Signed)
[facility] was called for PT 20st on list

## 2020-06-17 NOTE — ED Notes (Signed)
Pt IV meds continued with her own pump when she left.

## 2020-06-17 NOTE — Telephone Encounter (Signed)
Transition Care Management Follow-up Telephone Call Date of discharge and from where: 06/16/2020 - [facility] How have you been since you were released from the hospital? "Doing fine, just needed my medication refilled" Any questions or concerns? No  Items Reviewed: Did the pt receive and understand the discharge instructions provided? Yes  Medications obtained and verified? Yes  Other? No  Any new allergies since your discharge? No  Dietary orders reviewed? No Do you have support at home? No    Functional Questionnaire: (I = Independent and D = Dependent) ADLs: I  Bathing/Dressing- I  Meal Prep- I  Eating- I  Maintaining continence- I  Transferring/Ambulation- I  Managing Meds- I  Follow up appointments reviewed:  PCP Hospital f/u appt confirmed? No   Specialist Hospital f/u appt confirmed? No  - has routine Cardiology appointment in July Are transportation arrangements needed? No  If their condition worsens, is the pt aware to call PCP or go to the Emergency Dept.? Yes Was the patient provided with contact information for the PCP's office or ED? Yes Was to pt encouraged to call back with questions or concerns? Yes

## 2020-06-17 NOTE — ED Provider Notes (Signed)
[facility] EMERGENCY DEPARTMENT Provider Note   CSN: [id number] Arrival date & time: 06/17/20  1220     History Chief Complaint  Patient presents with   Vascular Access Problem    [name] is a 60 y.o. female.  60 year old female with extensive past medical history below including chronic respiratory failure due to COPD and pulmonary artery hypertension, CHF, OSA who presents with PICC line dislodgment.  Patient has a PICC line in place to receive continuous infusion of Remodulin.  She was just at the ED because she ran out of the medication.  She has not of medication at home but somehow in the last hour accidentally pulled out her PICC line.  She was itching it and rubbing on it and then all of a sudden it was out.  She denies any pain or bleeding.  The history is provided by the patient.      Past Medical History:  Diagnosis Date   Anemia, iron deficiency    secondary to menhorrhagia, on oral iron, also b12 def, getting monthly b12 shots   Cellulitis 05/2019   RIGHT LOWER EXTREMITY   CHF (congestive heart failure) (HCC)    Chronic cough    secondary to alleriges and post nasal drip   Cognitive impairment    COPD (chronic obstructive pulmonary disease) (HCC)    Cor pulmonale (HCC)    PA Peak pressure 96mHg   Depression    Diabetes mellitus    well controlled on metformin   Diastolic heart failure (HCC)    GERD (gastroesophageal reflux disease)    H/O mental retardation    Herpes    Hyperlipidemia    Hypertension    OSA (obstructive sleep apnea)    CPAP   Pulmonary hypertension ([place]    Renal disorder    Restrictive lung disease    PFTs 06/2012 (FVC 54% predicted and FEV1 68% predicted w minimal bronchodilator response).   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   Bleeding from the genitourinary system 09/13/2019   Gross hematuria 09/13/2019   Chronic venous stasis 05/07/2019   Urinary incontinence 12/12/2018   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[place]    Acute on chronic respiratory failure ([place] 06/15/2018   Right ovarian cyst 05/19/2017   Peripheral neuropathy ([place] 09/27/2014   Healthcare maintenance 09/27/2014   Acute on chronic diastolic (congestive) heart failure ([place] 06/25/2013   Atrial flutter ([place] 08/17/2012   Pulmonary hypertension ([place] 08/16/2012   Right heart failure, NYHA class 3 ([place] 03/15/2012   Allergic rhinitis 11/16/2011   Diabetes ([place] 10/15/2010   Hyperlipidemia 10/15/2010   Obesity 10/14/2010   Obstructive sleep apnea 02/21/2009   GERD 11/29/2005    Past Surgical History:  Procedure Laterality Date   CARDIAC CATHETERIZATION N/A 01/13/2016   Procedure: Right Heart Cath;  Surgeon: DJolaine Artist MD;  [facility];  Service: Cardiovascular;  Laterality: N/A;   CHOLECYSTECTOMY     COLONOSCOPY WITH PROPOFOL N/A 12/20/2019   Procedure: COLONOSCOPY WITH PROPOFOL;  Surgeon: [name], MD;  Location: [facility];  Service: Endoscopy;  Laterality: N/A;   IR FLUORO GUIDE CV LINE LEFT  11/16/2019   IR FLUORO GUIDE CV LINE RIGHT  05/09/2019   IR FLUORO GUIDE CV LINE RIGHT  08/12/2019   IR REMOVAL TUN CV CATH W/O FL  05/05/2019   IR US GUIDE VASC ACCESS LEFT  11/16/2019   IR US GUIDE VASC ACCESS RIGHT  05/09/2019   IR US GUIDE VASC ACCESS RIGHT  08/12/2019   IR US GUIDE VASC ACCESS RIGHT  11/16/2019   IR VENO/JUGULAR RIGHT  11/16/2019   LEFT AND RIGHT HEART CATHETERIZATION WITH CORONARY ANGIOGRAM N/A 02/28/2014   Procedure: LEFT  AND RIGHT HEART CATHETERIZATION WITH CORONARY ANGIOGRAM;  Surgeon: Jolaine Artist, MD;  Location: [facility] CATH LAB;  Service: Cardiovascular;  Laterality: N/A;   RIGHT HEART CATH N/A 06/27/2018   Procedure: RIGHT HEART CATH;  Surgeon: Jolaine Artist, MD;  Location: [facility] CV LAB;  Service: Cardiovascular;  Laterality: N/A;   RIGHT/LEFT HEART CATH AND CORONARY ANGIOGRAPHY N/A 05/14/2019   Procedure: RIGHT/LEFT HEART CATH AND CORONARY ANGIOGRAPHY;  Surgeon: Jolaine Artist, MD;  Location: [place] CV LAB;  Service: Cardiovascular;  Laterality: N/A;     OB History     Gravida  1   Para  1   Term  1   Preterm      AB      Living  1      SAB      IAB      Ectopic      Multiple      Live Births              Family History  Problem Relation Age of Onset   Kidney Stones Son    Mental illness Sister    Bipolar disorder Sister    Mental retardation Brother    Hyperlipidemia Mother    Breast cancer Maternal Aunt    Colon cancer Neg Hx    Pancreatic cancer Neg Hx    Esophageal cancer Neg Hx     Social History   Tobacco Use   Smoking status: Never   Smokeless tobacco: Never  Vaping Use   Vaping Use: Never used  Substance Use Topics   Alcohol use: No    Alcohol/week: 0.0 standard drinks   Drug use: No    Home Medications Prior to Admission medications   Medication Sig Start Date End Date Taking? Authorizing Provider  ACCU-CHEK AVIVA PLUS test strip USE 1 STRIP TO CHECK GLUCOSE ONCE DAILY 05/25/18   [name], MD  acetaminophen (TYLENOL) 500 MG tablet Take 1 tablet (500 mg total) by mouth every 6 (six) hours as needed. Patient taking differently: Take 500 mg by mouth every 6 (six) hours as needed (pain). 08/21/15   [name], MD  benzonatate (TESSALON) 200 MG capsule Take 1 capsule (200 mg total) by mouth 3 (three) times daily as needed for cough. 03/26/20 03/26/21  Laurin Coder, MD  cetirizine (EQ ALLERGY RELIEF, CETIRIZINE,) 10  MG tablet Take 1 tablet (10 mg total) by mouth daily. 05/13/20   [name], MD  clobetasol ointment (TEMOVATE) 7.25 % Apply 1 application topically as directed. Apply to vulva: qhs x 4wks & then every other night x 4wks, then 2x/week Patient taking differently: Apply 1 application topically at bedtime. 10/15/19   [name], MD  cycloSPORINE (RESTASIS) 0.05 % ophthalmic emulsion Place 1 drop into  both eyes 2 (two) times daily as needed (dry eyes).    [provider]  diphenhydrAMINE (BENADRYL) 25 MG tablet Take 50-100 mg by mouth 2 (two) times daily as needed for itching.    [provider]  FIBER PO Take by mouth.    [provider]  fluticasone (FLONASE) 50 MCG/ACT nasal spray Place 1 spray into both nostrils daily as needed for allergies. 05/13/20 05/13/21  [name], MD  gabapentin (NEURONTIN) 300 MG capsule TAKE 2 CAPSULES BY MOUTH THREE TIMES DAILY 04/01/20   [name], [name], DO  metFORMIN (GLUCOPHAGE) 500 MG tablet TAKE 1/2 (ONE-HALF) TABLET BY MOUTH WITH BREAKFAST 03/10/20   [name], MD  Na Sulfate-K Sulfate-Mg Sulf (SUPREP BOWEL PREP KIT) 17.5-3.13-1.6 GM/177ML SOLN Take 1 kit by mouth as directed. 11/23/19   [name], MD  OXYGEN Inhale 4 L into the lungs continuous.    [provider]  pantoprazole (PROTONIX) 20 MG tablet Take 1 tablet (20 mg total) by mouth 2 (two) times daily. 05/13/20   [name], MD  potassium chloride SA (KLOR-CON M20) 20 MEQ tablet Take 2 tablets (40 mEq total) by mouth 2 (two) times daily. 05/13/20   [name], MD  pravastatin (PRAVACHOL) 20 MG tablet Take 1 tablet by mouth once daily 03/28/20   [name], MD  sildenafil (REVATIO) 20 MG tablet TAKE 1 TABLET THREE TIMES A DAY ( GENERIC FOR REVATIO ) 05/02/20   Bensimhon, [name], MD  spironolactone (ALDACTONE) 25 MG tablet Take 1/2 (one-half) tablet by mouth once daily 03/28/20   [name], MD  torsemide (DEMADEX) 20 MG  tablet TAKE 4 TABLETS BY MOUTH TWICE DAILY 06/05/20   [name], MD  treprostinil (REMODULIN) 5 MG/ML SOLN injection See admin instructions. As of 08/09/2019: Add 7 ml of Remodulin to cassette and 93 ml of sterile diluent for Remodulin to cassetet for a total volume of 100 ml to make a concentration of 350,000 ng per ml.  Infuse via a CADD pump intravenously at a rate of 40 ml per 24 hours. Based on a dosing weight of 120 kg, the dose is 81 ng per kg per min.  Once opened, discard Remodulin vial after 30 days.  Sterile Diluent for Remodulin vials are single use only. Once mixed, discard cassette after 48 hours.    [provider]  vitamin B-12 (CYANOCOBALAMIN) 500 MCG tablet Take 500 mcg by mouth daily.    [provider]  XARELTO 20 MG TABS tablet TAKE 1 TABLET BY MOUTH ONCE DAILY WITH SUPPER 06/05/20   [name], MD    Allergies    Aspirin, Codeine, Lisinopril, Sulfonamide derivatives, and Latex  Review of Systems   Review of Systems  Constitutional:  Negative for fever.  Respiratory:  Positive for shortness of breath.   Skin:  Negative for wound.   Physical Exam Updated Vital Signs BP 123/64 (BP Location: Right Arm)   Pulse 65   Temp 98.2 F (36.8 C) (Oral)   Resp 16   LMP 02/19/2011   SpO2 100%   Physical Exam Vitals and nursing note reviewed.  Constitutional:      General: She is not in acute distress.    Appearance: She is well-developed.  HENT:     Head: Normocephalic and atraumatic.  Eyes:     Conjunctiva/sclera: Conjunctivae normal.  Cardiovascular:     Rate and Rhythm: Normal rate and regular rhythm.  Pulmonary:     Effort: Pulmonary  effort is normal.     Comments: Diminished b/l Musculoskeletal:     Cervical back: Neck supple.     Right lower leg: Edema present.     Left lower leg: Edema present.  Skin:    General: Skin is warm and dry.     Comments: Bandage on L upper chest with no bleeding  Neurological:     Mental Status: She is  alert and oriented to person, place, and time.  Psychiatric:        Judgment: Judgment normal.    ED Results / Procedures / Treatments   Labs (all labs ordered are listed, but only abnormal results are displayed) Labs Reviewed - No data to display  EKG None  Radiology No results found.  Procedures Procedures   Medications Ordered in ED Medications  treprostinil (REMODULIN) 35,000,000 ng in glycine diluent for epoprostenol 100 mL (350,000 ng/mL) infusion (has no administration in time range)  lidocaine-EPINEPHrine (XYLOCAINE W/EPI) 1 %-1:100000 (with pres) injection (10 mLs Subcutaneous Given 06/17/20 1419)    ED Course  I have reviewed the triage vital signs and the nursing notes.    MDM Rules/Calculators/A&P                          PICC replaced by IR. Pharmacy ensured patient has enough medication until her next home delivery and they restarted infusion at bedside prior to discharge. Final Clinical Impression(s) / ED Diagnoses Final diagnoses:  None    Rx / DC Orders ED Discharge Orders     None        Dyland Panuco, Wenda Overland, MD 06/17/20 [phone]

## 2020-06-17 NOTE — Procedures (Signed)
Interventional Radiology Procedure:   Indications: Pulmonary hypertension with Remodulin infusion.  Central line dislodged.  Procedure: Placement of tunneled central line  Findings: Left IJ Powerline, 29 cm, tip at SVC/RA junction.  Complications:  None     EBL: less than 10 ml  Plan: Central line is ready to use.    Charnelle Bergeman R. Anselm Pancoast, MD  Pager: [phone]

## 2020-06-17 NOTE — Progress Notes (Signed)
[facility] consulted for stat PICC placement d/t patient pulling out line. ER nurse contacted by [facility] and informed only one PICC nurse available today and she is not currently on [facility]. ER RN to discuss alternate plan with physician.   1415: Spoke with pt's nurse, Marcie Bal; she stated patient is in IR having PICC placed.

## 2020-06-17 NOTE — ED Triage Notes (Signed)
Pt anciently pulled out her PICC and needs her medication for pulmonary hypertension. Pt need PICC placed

## 2020-06-18 ENCOUNTER — Telehealth: Payer: Self-pay | Admitting: *Deleted

## 2020-06-18 NOTE — Telephone Encounter (Signed)
Transition Care Management Follow-up Telephone Call Date of discharge and from where: 06/17/2020 - [facility] How have you been since you were released from the hospital? "Fine" Any questions or concerns? No  Items Reviewed: Did the pt receive and understand the discharge instructions provided? Yes  Medications obtained and verified? Yes  Other? No  Any new allergies since your discharge? No  Dietary orders reviewed? No Do you have support at home? No    Functional Questionnaire: (I = Independent and D = Dependent) ADLs: I  Bathing/Dressing- I  Meal Prep- I  Eating- I  Maintaining continence- I  Transferring/Ambulation- I  Managing Meds- I  Follow up appointments reviewed:  PCP Hospital f/u appt confirmed? No   Specialist Hospital f/u appt confirmed? No   Are transportation arrangements needed? No  If their condition worsens, is the pt aware to call PCP or go to the Emergency Dept.? Yes Was the patient provided with contact information for the PCP's office or ED? Yes Was to pt encouraged to call back with questions or concerns? Yes

## 2020-06-21 ENCOUNTER — Other Ambulatory Visit: Payer: Self-pay | Admitting: Student

## 2020-06-21 DIAGNOSIS — E7800 Pure hypercholesterolemia, unspecified: Secondary | ICD-10-CM

## 2020-06-25 ENCOUNTER — Telehealth: Payer: Self-pay | Admitting: *Deleted

## 2020-06-25 NOTE — Telephone Encounter (Signed)
Order and CMN for urinary incontinence supplies faxed to Honor Loh at [place]. Fax confirmation receipt received.

## 2020-06-27 DIAGNOSIS — N3946 Mixed incontinence: Secondary | ICD-10-CM | POA: Diagnosis not present

## 2020-06-27 DIAGNOSIS — E119 Type 2 diabetes mellitus without complications: Secondary | ICD-10-CM | POA: Diagnosis not present

## 2020-06-30 ENCOUNTER — Other Ambulatory Visit: Payer: Self-pay

## 2020-06-30 NOTE — Telephone Encounter (Signed)
Pravastatin #90 sent 06/23/20. Will forward request for xarelto.

## 2020-06-30 NOTE — Telephone Encounter (Signed)
pravastatin (PRAVACHOL) 20 MG tablet  XARELTO 20 MG TABS tablet, REFILL REQUEST @  [place] ([place]), [place] - [id number] Phone:  [phone]  Fax:  [phone]

## 2020-07-01 ENCOUNTER — Other Ambulatory Visit: Payer: Self-pay | Admitting: Internal Medicine

## 2020-07-01 DIAGNOSIS — G4730 Sleep apnea, unspecified: Secondary | ICD-10-CM | POA: Diagnosis not present

## 2020-07-01 DIAGNOSIS — I270 Primary pulmonary hypertension: Secondary | ICD-10-CM | POA: Diagnosis not present

## 2020-07-01 DIAGNOSIS — G4733 Obstructive sleep apnea (adult) (pediatric): Secondary | ICD-10-CM | POA: Diagnosis not present

## 2020-07-01 MED ORDER — RIVAROXABAN 20 MG PO TABS: 20.0000 mg | ORAL_TABLET | Freq: Every day | ORAL | 0 refills | Status: DC

## 2020-07-03 ENCOUNTER — Emergency Department (HOSPITAL_COMMUNITY)
Admission: EM | Admit: 2020-07-03 | Discharge: 2020-07-04 | Disposition: A | Discharge status: Home / Self Care | Payer: Medicaid Other | Attending: Diagnostic Radiology | Admitting: Diagnostic Radiology

## 2020-07-03 DIAGNOSIS — E785 Hyperlipidemia, unspecified: Secondary | ICD-10-CM | POA: Insufficient documentation

## 2020-07-03 DIAGNOSIS — E1142 Type 2 diabetes mellitus with diabetic polyneuropathy: Secondary | ICD-10-CM | POA: Diagnosis not present

## 2020-07-03 DIAGNOSIS — I5032 Chronic diastolic (congestive) heart failure: Secondary | ICD-10-CM | POA: Diagnosis not present

## 2020-07-03 DIAGNOSIS — Z841 Family history of disorders of kidney and ureter: Secondary | ICD-10-CM | POA: Diagnosis not present

## 2020-07-03 DIAGNOSIS — Z7984 Long term (current) use of oral hypoglycemic drugs: Secondary | ICD-10-CM | POA: Insufficient documentation

## 2020-07-03 DIAGNOSIS — I110 Hypertensive heart disease with heart failure: Secondary | ICD-10-CM | POA: Diagnosis not present

## 2020-07-03 DIAGNOSIS — Z91040 Latex allergy status: Secondary | ICD-10-CM | POA: Insufficient documentation

## 2020-07-03 DIAGNOSIS — Z452 Encounter for adjustment and management of vascular access device: Secondary | ICD-10-CM | POA: Insufficient documentation

## 2020-07-03 DIAGNOSIS — Z20822 Contact with and (suspected) exposure to covid-19: Secondary | ICD-10-CM | POA: Insufficient documentation

## 2020-07-03 DIAGNOSIS — J449 Chronic obstructive pulmonary disease, unspecified: Secondary | ICD-10-CM | POA: Diagnosis not present

## 2020-07-03 DIAGNOSIS — Z882 Allergy status to sulfonamides status: Secondary | ICD-10-CM | POA: Insufficient documentation

## 2020-07-03 DIAGNOSIS — D509 Iron deficiency anemia, unspecified: Secondary | ICD-10-CM | POA: Diagnosis not present

## 2020-07-03 DIAGNOSIS — Z79899 Other long term (current) drug therapy: Secondary | ICD-10-CM | POA: Diagnosis not present

## 2020-07-03 DIAGNOSIS — Z888 Allergy status to other drugs, medicaments and biological substances status: Secondary | ICD-10-CM | POA: Diagnosis not present

## 2020-07-03 DIAGNOSIS — Z886 Allergy status to analgesic agent status: Secondary | ICD-10-CM | POA: Diagnosis not present

## 2020-07-03 DIAGNOSIS — Z803 Family history of malignant neoplasm of breast: Secondary | ICD-10-CM | POA: Insufficient documentation

## 2020-07-03 DIAGNOSIS — I878 Other specified disorders of veins: Secondary | ICD-10-CM | POA: Insufficient documentation

## 2020-07-03 DIAGNOSIS — Z95828 Presence of other vascular implants and grafts: Secondary | ICD-10-CM

## 2020-07-03 DIAGNOSIS — Z885 Allergy status to narcotic agent status: Secondary | ICD-10-CM | POA: Insufficient documentation

## 2020-07-03 DIAGNOSIS — I272 Pulmonary hypertension, unspecified: Secondary | ICD-10-CM | POA: Insufficient documentation

## 2020-07-03 NOTE — ED Triage Notes (Signed)
Pt states port tubing line "came out," unsure how. Pt states she is unsure if site is infected. Site red, bleeding controlled. Pt has tubing in biohazard bag.  4/10 burning sensation w movement.  Line used for pulmonary HTN medication, states she is compliant w medications, 4L  at baseline

## 2020-07-03 NOTE — ED Provider Notes (Signed)
Emergency Medicine Provider Triage Evaluation Note  [name] , a 60 y.o. female  was evaluated in triage.  Pt complains of dislodgment.  Reports that her PICC line came out.  Patient is unsure how exactly this happened patient believes this happened approximately 1930.  Patient denies any pain or discomfort at PICC line site.  Patient states she needs this PICC line for Remodulin infusion.  Review of Systems  Positive: Wound Negative: Fever, chills, myalgia  Physical Exam  BP 118/64 (BP Location: Right Arm)   Pulse 82   Temp 98.4 F (36.9 C) (Oral)   Resp (!) 22   LMP 02/19/2011   SpO2 94%  Gen:   Awake, no distress   Resp:  Normal effort  MSK:   Moves extremities without difficulty  Other:  Has wound to left chest where PICC line was placed, there is an irritation in minimal bleeding  Medical Decision Making  Medically screening exam initiated at 8:41 PM.  Appropriate orders placed.  Jacquese A Sultan was informed that the remainder of the evaluation will be completed by another provider, this initial triage assessment does not replace that evaluation, and the importance of remaining in the ED until their evaluation is complete.  The patient appears stable so that the remainder of the work up may be completed by another provider.      Loni Beckwith, PA-C 07/03/20 2044    Deno Etienne, DO 07/03/20 2210

## 2020-07-04 ENCOUNTER — Emergency Department (HOSPITAL_COMMUNITY): Payer: Medicaid Other

## 2020-07-04 DIAGNOSIS — G4730 Sleep apnea, unspecified: Secondary | ICD-10-CM | POA: Diagnosis not present

## 2020-07-04 DIAGNOSIS — I270 Primary pulmonary hypertension: Secondary | ICD-10-CM | POA: Diagnosis not present

## 2020-07-04 DIAGNOSIS — I5030 Unspecified diastolic (congestive) heart failure: Secondary | ICD-10-CM | POA: Diagnosis not present

## 2020-07-04 DIAGNOSIS — J984 Other disorders of lung: Secondary | ICD-10-CM | POA: Diagnosis not present

## 2020-07-04 DIAGNOSIS — I272 Pulmonary hypertension, unspecified: Secondary | ICD-10-CM | POA: Diagnosis not present

## 2020-07-04 DIAGNOSIS — Z452 Encounter for adjustment and management of vascular access device: Secondary | ICD-10-CM | POA: Diagnosis not present

## 2020-07-04 HISTORY — PX: IR US GUIDE VASC ACCESS LEFT: IMG2389

## 2020-07-04 HISTORY — PX: IR FLUORO GUIDE CV LINE LEFT: IMG2282

## 2020-07-04 LAB — RESP PANEL BY RT-PCR (FLU A&B, COVID) ARPGX2
Influenza A by PCR: NEGATIVE
Influenza B by PCR: NEGATIVE
SARS Coronavirus 2 by RT PCR: NEGATIVE

## 2020-07-04 MED ORDER — LIDOCAINE HCL 1 % IJ SOLN
INTRAMUSCULAR | Status: DC | PRN
  Administered 2020-07-04: 5 mL

## 2020-07-04 MED ORDER — HEPARIN SOD (PORK) LOCK FLUSH 100 UNIT/ML IV SOLN
INTRAVENOUS | Status: AC
  Filled 2020-07-04: qty 5

## 2020-07-04 MED ORDER — LIDOCAINE HCL 1 % IJ SOLN
INTRAMUSCULAR | Status: AC
  Filled 2020-07-04: qty 20

## 2020-07-04 MED ORDER — CHLORHEXIDINE GLUCONATE 4 % EX LIQD
CUTANEOUS | Status: AC
  Filled 2020-07-04: qty 15

## 2020-07-04 NOTE — ED Notes (Signed)
Per pt, her phone is dead. Called pt's son to notify of pt ready for DC. Pt's son is at work and will call someone to come get his mother.

## 2020-07-04 NOTE — Discharge Instructions (Addendum)
You just need to be rechecked in about 1 week so they can look at that area of redness to make sure it is getting better.  Make sure you are not putting anything irritating on it.  Now that there is no line there do not use any alcohol to the area.  If it starts becoming redder, draining, swollen or you start running a fever you need to return to the emergency room.

## 2020-07-04 NOTE — ED Notes (Signed)
Pt back from IR

## 2020-07-04 NOTE — ED Notes (Signed)
Pt ambulated to bathroom

## 2020-07-04 NOTE — Procedures (Signed)
Interventional Radiology Procedure:   Indications: Pulmonary hypertension with chronic central line for Remodulin infusion.  Catheter was dislodged.  Procedure: Placement of new left jugular tunneled central line   Findings: Old catheter site is very red and irritated.  New left jugular Powerline (single lumen, 33 cm) with tip in right atrium.  Catheter was placed into right atrium because the catheter was retracting into right innominate vein when it was in SVC.    Complications: None     EBL: less than 5 ml  Plan: Tunneled central line is ready to use.  Discussed redness at old catheter site with Dr. Maryan Rued in ED and she will address this area.   Calyn Sivils R. Anselm Pancoast, MD  Pager: [phone]

## 2020-07-04 NOTE — ED Provider Notes (Signed)
Patient received her PICC line.  It is now functional.  She does have an area of significant redness around where her old site was without swelling or drainage.  This appears reactive in nature.  Low suspicion for cellulitis at this time.  Do not feel that patient needs any antibiotics but she will need a recheck in 1 week to ensure the wound is significantly better.  She was given return precautions.   Blanchie Dessert, MD 07/04/20 1102

## 2020-07-04 NOTE — ED Provider Notes (Signed)
[facility] EMERGENCY DEPARTMENT Provider Note   CSN: [id number] Arrival date & time: 07/03/20  2012     History Chief Complaint  Patient presents with   Port tubing out    [name] is a 60 y.o. female.  The history is provided by the patient.  She has history of hypertension, diabetes, hyperlipidemia, COPD, chronic diastolic heart failure, venous stasis and comes in because a PICC line became dislodged.  PICC line is in the left shoulder area and she gets infusions of Remodulin once a month.  She is scheduled to get her next cartridge in the next 24-36 hours.   Past Medical History:  Diagnosis Date   Anemia, iron deficiency    secondary to menhorrhagia, on oral iron, also b12 def, getting monthly b12 shots   Cellulitis 05/2019   RIGHT LOWER EXTREMITY   CHF (congestive heart failure) (HCC)    Chronic cough    secondary to alleriges and post nasal drip   Cognitive impairment    COPD (chronic obstructive pulmonary disease) (HCC)    Cor pulmonale (HCC)    PA Peak pressure 75mHg   Depression    Diabetes mellitus    well controlled on metformin   Diastolic heart failure (HCC)    GERD (gastroesophageal reflux disease)    H/O mental retardation    Herpes    Hyperlipidemia    Hypertension    OSA (obstructive sleep apnea)    CPAP   Pulmonary hypertension ([place]    Renal disorder    Restrictive lung disease    PFTs 06/2012 (FVC 54% predicted and FEV1 68% predicted w minimal bronchodilator response).   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   Bleeding from the genitourinary system 09/13/2019   Gross hematuria 09/13/2019   Chronic venous stasis 05/07/2019   Urinary incontinence 12/12/2018   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[place]    Acute on chronic respiratory failure ([place] 06/15/2018   Right ovarian cyst 05/19/2017   Peripheral neuropathy ([place] 09/27/2014   Healthcare maintenance 09/27/2014   Acute on chronic diastolic (congestive) heart failure ([place] 06/25/2013   Atrial flutter ([place] 08/17/2012   Pulmonary hypertension ([place] 08/16/2012   Right heart failure, NYHA class 3 ([place] 03/15/2012   Allergic rhinitis 11/16/2011   Diabetes ([place] 10/15/2010   Hyperlipidemia 10/15/2010   Obesity 10/14/2010   Obstructive sleep apnea 02/21/2009   GERD 11/29/2005    Past Surgical History:  Procedure Laterality Date   CARDIAC CATHETERIZATION N/A 01/13/2016   Procedure: Right Heart Cath;  Surgeon: DJolaine Artist MD;  [facility];  Service: Cardiovascular;  Laterality: N/A;   CHOLECYSTECTOMY     COLONOSCOPY WITH PROPOFOL N/A 12/20/2019   Procedure: COLONOSCOPY WITH PROPOFOL;  Surgeon: JMilus Banister MD;  [facility];  Service: Endoscopy;  Laterality: N/A;   IR FLUORO GUIDE CV LINE LEFT  11/16/2019   IR FLUORO GUIDE CV LINE LEFT  06/17/2020   IR FLUORO GUIDE CV LINE RIGHT  05/09/2019   IR  FLUORO GUIDE CV LINE RIGHT  08/12/2019   IR REMOVAL TUN CV CATH W/O FL  05/05/2019   IR US GUIDE VASC ACCESS LEFT  11/16/2019   IR US GUIDE VASC ACCESS LEFT  06/17/2020   IR US GUIDE VASC ACCESS RIGHT  05/09/2019   IR US GUIDE VASC ACCESS RIGHT  08/12/2019   IR US GUIDE VASC ACCESS RIGHT  11/16/2019   IR VENO/JUGULAR RIGHT  11/16/2019   LEFT AND RIGHT HEART CATHETERIZATION WITH CORONARY ANGIOGRAM N/A 02/28/2014   Procedure: LEFT AND RIGHT HEART CATHETERIZATION WITH CORONARY ANGIOGRAM;  Surgeon: Jolaine Artist, MD;  Location: [facility] CATH LAB;  Service: Cardiovascular;   Laterality: N/A;   RIGHT HEART CATH N/A 06/27/2018   Procedure: RIGHT HEART CATH;  Surgeon: Jolaine Artist, MD;  Location: [place] [facility] LAB;  Service: Cardiovascular;  Laterality: N/A;   RIGHT/LEFT HEART CATH AND CORONARY ANGIOGRAPHY N/A 05/14/2019   Procedure: RIGHT/LEFT HEART CATH AND CORONARY ANGIOGRAPHY;  Surgeon: Jolaine Artist, MD;  Location: [place] [facility] LAB;  Service: Cardiovascular;  Laterality: N/A;     OB History     Gravida  1   Para  1   Term  1   Preterm      AB      Living  1      SAB      IAB      Ectopic      Multiple      Live Births              Family History  Problem Relation Age of Onset   Kidney Stones Son    Mental illness Sister    Bipolar disorder Sister    Mental retardation Brother    Hyperlipidemia Mother    Breast cancer Maternal Aunt    Colon cancer Neg Hx    Pancreatic cancer Neg Hx    Esophageal cancer Neg Hx     Social History   Tobacco Use   Smoking status: Never   Smokeless tobacco: Never  Vaping Use   Vaping Use: Never used  Substance Use Topics   Alcohol use: No    Alcohol/week: 0.0 standard drinks   Drug use: No    Home Medications Prior to Admission medications   Medication Sig Start Date End Date Taking? Authorizing Provider  ACCU-CHEK AVIVA PLUS test strip USE 1 STRIP TO CHECK GLUCOSE ONCE DAILY 05/25/18   [name], MD  acetaminophen (TYLENOL) 500 MG tablet Take 1 tablet (500 mg total) by mouth every 6 (six) hours as needed. Patient taking differently: Take 500 mg by mouth every 6 (six) hours as needed (pain). 08/21/15   [name], MD  benzonatate (TESSALON) 200 MG capsule Take 1 capsule (200 mg total) by mouth 3 (three) times daily as needed for cough. 03/26/20 03/26/21  Laurin Coder, MD  cetirizine (EQ ALLERGY RELIEF, CETIRIZINE,) 10 MG tablet Take 1 tablet (10 mg total) by mouth daily. 05/13/20   [name], MD  clobetasol ointment (TEMOVATE) 7.78 % Apply 1  application topically as directed. Apply to vulva: qhs x 4wks & then every other night x 4wks, then 2x/week Patient taking differently: Apply 1 application topically at bedtime. 10/15/19   [name], MD  cycloSPORINE (RESTASIS) 0.05 % ophthalmic emulsion Place 1 drop into both eyes 2 (two) times daily as needed (dry eyes).    [provider]  diphenhydrAMINE (BENADRYL) 25 MG tablet Take 50-100 mg by mouth 2 (  two) times daily as needed for itching.    [provider]  FIBER PO Take by mouth.    [provider]  fluticasone (FLONASE) 50 MCG/ACT nasal spray Place 1 spray into both nostrils daily as needed for allergies. 05/13/20 05/13/21  [name], MD  gabapentin (NEURONTIN) 300 MG capsule TAKE 2 CAPSULES BY MOUTH THREE TIMES DAILY 04/01/20   [name], [name], DO  metFORMIN (GLUCOPHAGE) 500 MG tablet TAKE 1/2 (ONE-HALF) TABLET BY MOUTH WITH BREAKFAST 07/02/20   [name], MD  Na Sulfate-K Sulfate-Mg Sulf (SUPREP BOWEL PREP KIT) 17.5-3.13-1.6 GM/177ML SOLN Take 1 kit by mouth as directed. 11/23/19   [name], MD  OXYGEN Inhale 4 L into the lungs continuous.    [provider]  pantoprazole (PROTONIX) 20 MG tablet Take 1 tablet (20 mg total) by mouth 2 (two) times daily. 05/13/20   [name], MD  potassium chloride SA (KLOR-CON M20) 20 MEQ tablet Take 2 tablets (40 mEq total) by mouth 2 (two) times daily. 05/13/20   [name], MD  pravastatin (PRAVACHOL) 20 MG tablet Take 1 tablet by mouth once daily 06/23/20   [name], MD  rivaroxaban (XARELTO) 20 MG TABS tablet Take 1 tablet (20 mg total) by mouth daily with supper. 07/01/20   [name], MD  sildenafil (REVATIO) 20 MG tablet TAKE 1 TABLET THREE TIMES A DAY ( GENERIC FOR REVATIO ) 05/02/20   Bensimhon, [name], MD  spironolactone (ALDACTONE) 25 MG tablet Take 1/2 (one-half) tablet by mouth once daily 03/28/20   [name], MD  torsemide (DEMADEX) 20 MG tablet  TAKE 4 TABLETS BY MOUTH TWICE DAILY 06/05/20   [name], MD  treprostinil (REMODULIN) 5 MG/ML SOLN injection See admin instructions. As of 08/09/2019: Add 7 ml of Remodulin to cassette and 93 ml of sterile diluent for Remodulin to cassetet for a total volume of 100 ml to make a concentration of 350,000 ng per ml.  Infuse via a CADD pump intravenously at a rate of 40 ml per 24 hours. Based on a dosing weight of 120 kg, the dose is 81 ng per kg per min.  Once opened, discard Remodulin vial after 30 days.  Sterile Diluent for Remodulin vials are single use only. Once mixed, discard cassette after 48 hours.    [provider]  vitamin B-12 (CYANOCOBALAMIN) 500 MCG tablet Take 500 mcg by mouth daily.    [provider]    Allergies    Aspirin, Codeine, Lisinopril, Sulfonamide derivatives, and Latex  Review of Systems   Review of Systems  All other systems reviewed and are negative.  Physical Exam Updated Vital Signs BP 111/64   Pulse 63   Temp 98.5 F (36.9 C) (Oral)   Resp 18   LMP 02/19/2011   SpO2 97%   Physical Exam Vitals and nursing note reviewed.  60 year old female, resting comfortably and in no acute distress. Vital signs are normal. Oxygen saturation is 97%, which is normal. Head is normocephalic and atraumatic. PERRLA, EOMI. Oropharynx is clear. Neck is nontender and supple without adenopathy or JVD. Back is nontender and there is no CVA tenderness. Lungs are clear without rales, wheezes, or rhonchi. Chest is nontender.  Mild erythema of the skin at the site where the PICC line was in the left upper anterior chest wall. Heart has regular rate and rhythm without murmur. Abdomen is soft, flat, nontender without masses or hepatosplenomegaly and peristalsis is normoactive. Extremities  have 2+ edema with severe venous stasis changes, full range of motion is present. Skin is warm and dry without rash. Neurologic: Mental status is normal, cranial nerves are  intact, there are no motor or sensory deficits.  ED Results / Procedures / Treatments    Procedures Procedures   Medications Ordered in ED Medications - No data to display  ED Course  I have reviewed the triage vital signs and the nursing notes.  MDM Rules/Calculators/A&P                         Accidental removal of PICC line.  We will try to arrange for reinsertion.  Old records are reviewed, confirming patient is on chronic Remodulin for pulmonary hypertension.  Case is signed out to Dr. Maryan Rued pending PICC line insertion.  Final Clinical Impression(s) / ED Diagnoses Final diagnoses:  Accidental PICC Line Removal    Rx / DC Orders ED Discharge Orders     None        Delora Fuel, MD 72/15/87 [phone]

## 2020-07-04 NOTE — ED Notes (Signed)
Pt transported to IR

## 2020-07-08 ENCOUNTER — Telehealth: Payer: Self-pay

## 2020-07-08 NOTE — Telephone Encounter (Signed)
Transition Care Management Unsuccessful Follow-up Telephone Call  Date of discharge and from where:  07/04/2020 from [facility]  Attempts:  1st Attempt  Reason for unsuccessful TCM follow-up call:  Unable to leave message

## 2020-07-09 NOTE — Telephone Encounter (Signed)
Transition Care Management Unsuccessful Follow-up Telephone Call  Date of discharge and from where:  07/04/2020 [facility]  Attempts:  2nd Attempt  Reason for unsuccessful TCM follow-up call:  Unable to leave message

## 2020-07-10 NOTE — Telephone Encounter (Signed)
Transition Care Management Unsuccessful Follow-up Telephone Call  Date of discharge and from where:  07/05/2020  Attempts:  3rd Attempt  Reason for unsuccessful TCM follow-up call:  Unable to leave message

## 2020-07-23 ENCOUNTER — Encounter (HOSPITAL_COMMUNITY): Payer: Medicaid Other | Admitting: Internal Medicine

## 2020-07-24 ENCOUNTER — Other Ambulatory Visit: Payer: Self-pay

## 2020-07-24 MED ORDER — RIVAROXABAN 20 MG PO TABS: 20.0000 mg | ORAL_TABLET | Freq: Every day | ORAL | 2 refills | Status: DC

## 2020-07-24 NOTE — Patient Outreach (Signed)
Medicaid Managed Care    Pharmacy Note  07/24/2020 Name: [name] MRN: [id number] DOB: [birth year]  [name] is a 60 y.o. year old female who is a primary care patient of Masters, [name], DO. The [facility] Managed Care Coordination team was consulted for assistance with disease management and care coordination needs.    Engaged with patient Engaged with patient by telephone for follow up visit in response to referral for case management and/or care coordination services.  Ms. Despain was given information about Managed Medicaid Care Coordination team services today. [name] agreed to services and verbal consent obtained.   Objective:  Lab Results  Component Value Date   CREATININE 0.77 12/13/2019   CREATININE 0.77 08/21/2019   CREATININE 0.59 08/12/2019    Lab Results  Component Value Date   HGBA1C 5.6 05/05/2020       Component Value Date/Time   CHOL 151 05/05/2020 1024   TRIG 124 05/05/2020 1024   HDL 43 05/05/2020 1024   CHOLHDL 3.5 05/05/2020 1024   CHOLHDL 3.5 07/22/2014 1517   VLDL 24 07/22/2014 1517   LDLCALC 86 05/05/2020 1024    Other: (TSH, CBC, Vit D, etc.)  Clinical ASCVD: Yes  The ASCVD Risk score Mikey Bussing DC Jr., et al., 2013) failed to calculate for the following reasons:   The patient has a prior MI or stroke diagnosis    Other: (CHADS2VASc if Afib, PHQ9 if depression, MMRC or CAT for COPD, ACT, DEXA)  BP Readings from Last 3 Encounters:  07/04/20 107/64  06/17/20 (!) 104/57  06/16/20 107/73    Assessment/Interventions: Review of patient past medical history, allergies, medications, health status, including review of consultants reports, laboratory and other test data, was performed as part of comprehensive evaluation and provision of chronic care management services.   Cardio: Spironolactone 76m Torsemide 84mBID KCl 2036mXarelto 5m64man: At goal,  patient stable/ symptoms controlled   Lipids: Lab Results  Component  Value Date   CHOL 151 05/05/2020   CHOL 153 11/08/2016   CHOL 159 07/22/2014   Lab Results  Component Value Date   HDL 43 05/05/2020   HDL 47 11/08/2016   HDL 45 (L) 07/22/2014   Lab Results  Component Value Date   LDLCALC 86 05/05/2020   LDLCALC 85 11/08/2016   LDLCALC 90 07/22/2014   Lab Results  Component Value Date   TRIG 124 05/05/2020   TRIG 107 11/08/2016   TRIG 119 07/22/2014   Lab Results  Component Value Date   CHOLHDL 3.5 05/05/2020   CHOLHDL 3.3 11/08/2016   CHOLHDL 3.5 07/22/2014   No results found for: LDLDIRECT Pravastatin 5mg53mil 2022 Plan: Patient on low intensity statin but LDL below 100. Will ask to increase to High intensity due to risk factors Plan: No change of statin, will defer to PCP At goal,  patient stable/ symptoms controlled   Pre-DM Lab Results  Component Value Date   HGBA1C 5.6 05/05/2020   HGBA1C 5.5 04/09/2019   HGBA1C 6.1 (A) 07/11/2018   Lab Results  Component Value Date   MICROALBUR 1.3 11/15/2013   LDLCALC 86 05/05/2020   CREATININE 0.77 12/13/2019    Lab Results  Component Value Date   NA 140 12/13/2019   K 4.0 12/13/2019   CREATININE 0.77 12/13/2019   GFRNONAA >60 08/21/2019   GFRAA >60 08/21/2019   GLUCOSE 91 12/13/2019    Lab Results  Component Value Date   WBC 8.1 12/13/2019   HGB  10.8 (L) 12/13/2019   HCT 33.5 (L) 12/13/2019   MCV 78.6 12/13/2019   PLT 451.0 (H) 12/13/2019   Metformin 243m Plan: At goal,  patient stable/ symptoms controlled    SDOH (Social Determinants of Health) assessments and interventions performed:    Care Plan  Allergies  Allergen Reactions   Aspirin Swelling    REACTION: airway swelling   Codeine Other (See Comments)    REACTION: tingling in lips and hard breathing - had reaction at dentist - states "I can't take certain kinds of codeine" - happened maybe 10 yr ago   Lisinopril Cough   Sulfonamide Derivatives Swelling    REACTION: airway swelling   Latex Rash     Medications Reviewed Today     Reviewed by HLannette Donath CPhT (Pharmacy Technician) on 07/04/20 at 0[place] CityList Status: Complete   Medication Order Taking? Sig Documenting Provider Last Dose Status Informant  ACCU-CHEK AVIVA PLUS test strip [id number] USE 1 STRIP TO CHECK GLUCOSE ONCE DAILY Winfrey, WJenne Pane MD  Active Self  acetaminophen (TYLENOL) 500 MG tablet [id number] Take 1 tablet (500 mg total) by mouth every 6 (six) hours as needed.  Patient taking differently: Take 500 mg by mouth every 6 (six) hours as needed (pain).   SBurgess Estelle MD 07/03/2020 Active Self  benzonatate (TESSALON) 200 MG capsule [id number] Take 1 capsule (200 mg total) by mouth 3 (three) times daily as needed for cough. OLaurin Coder MD unk Active Self  cetirizine (EQ ALLERGY RELIEF, CETIRIZINE,) 10 MG tablet [id number] Take 1 tablet (10 mg total) by mouth daily. KAsencion Noble MD 07/03/2020 Active Self  clobetasol ointment (TEMOVATE) 0.05 % [id number] Apply 1 application topically as directed. Apply to vulva: qhs x 4wks & then every other night x 4wks, then 2x/week  Patient not taking: No sig reported   PAletha Halim MD Not Taking Active Self  cycloSPORINE (RESTASIS) 0.05 % ophthalmic emulsion [id number] Place 1 drop into both eyes 2 (two) times daily as needed (dry eyes). [provider] 07/03/2020 Active Self  diphenhydrAMINE (BENADRYL) 25 MG tablet [id number] Take 50-100 mg by mouth 2 (two) times daily as needed for itching. [provider] unk Active Self  fluticasone (FLONASE) 50 MCG/ACT nasal spray [id number] Place 1 spray into both nostrils daily as needed for allergies. KAsencion Noble MD 07/02/2020 Active Self  gabapentin (NEURONTIN) 300 MG capsule [id number] TAKE 2 CAPSULES BY MOUTH THREE TIMES DAILY  Patient taking differently: Take 600 mg by mouth 3 (three) times daily.   [name], [name], DO 07/03/2020 Active Self  metFORMIN (GLUCOPHAGE)  500 MG tablet [id number] TAKE 1/2 (ONE-HALF) TABLET BY MOUTH WITH BREAKFAST  Patient taking differently: Take 250 mg by mouth daily with breakfast.   WMaudie Mercury MD 07/03/2020 Active Self  Na Sulfate-K Sulfate-Mg Sulf (SUPREP BOWEL PREP KIT) 17.5-3.13-1.6 GM/177ML SOLN [id number] Take 1 kit by mouth as directed.  Patient not taking: No sig reported   JMilus Banister MD Completed Course Active Self  OXYGEN [id number] Inhale 4 L into the lungs continuous. [provider]  Active Self  pantoprazole (PROTONIX) 20 MG tablet [id number] Take 1 tablet (20 mg total) by mouth 2 (two) times daily. KAsencion Noble MD 07/03/2020 Active Self  potassium chloride SA (KLOR-CON M20) 20 MEQ tablet [id number] Take 2 tablets (40 mEq total) by mouth 2 (two) times daily. KAsencion Noble MD 07/03/2020 Active  Self  pravastatin (PRAVACHOL) 20 MG tablet [id number] Yes Take 1 tablet by mouth once daily  Patient taking differently: Take 20 mg by mouth daily.   [name], MD 07/03/2020 Active Self  rivaroxaban (XARELTO) 20 MG TABS tablet [id number] Yes Take 1 tablet (20 mg total) by mouth daily with supper. [name], MD 07/03/2020 1930 Active Self  sildenafil (REVATIO) 20 MG tablet [id number] Yes TAKE 1 TABLET THREE TIMES A DAY ( [place] )  Patient taking differently: Take 20 mg by mouth 3 (three) times daily.   BensimhonShaune Pascal, MD 07/03/2020 Active Self  spironolactone (ALDACTONE) 25 MG tablet [id number] Yes Take 1/2 (one-half) tablet by mouth once daily  Patient taking differently: 12.5 mg daily.   [name], MD 07/03/2020 Active Self  torsemide (DEMADEX) 20 MG tablet [id number] Yes TAKE 4 TABLETS BY MOUTH TWICE DAILY  Patient taking differently: Take 80 mg by mouth 2 (two) times daily.   [name], MD 07/03/2020 Active Self  treprostinil (REMODULIN) 5 MG/ML SOLN injection [id number] Yes See admin instructions. As of 08/09/2019: Add 7 ml of Remodulin to  cassette and 93 ml of sterile diluent for Remodulin to cassetet for a total volume of 100 ml to make a concentration of 350,000 ng per ml.  Infuse via a CADD pump intravenously at a rate of 40 ml per 24 hours. Based on a dosing weight of 120 kg, the dose is 81 ng per kg per min.  Once opened, discard Remodulin vial after 30 days.  Sterile Diluent for Remodulin vials are single use only. Once mixed, discard cassette after 48 hours. [provider] 07/03/2020 Active Self           Med Note Sharin Grave   Mon Dec 17, 2019 12:50 PM) Continuous infusion 12/17/19  vitamin B-12 (CYANOCOBALAMIN) 500 MCG tablet [id number] Yes Take 500 mcg by mouth daily. 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[place])    Acute on chronic respiratory failure ([place]) 06/15/2018   Right ovarian cyst 05/19/2017   Peripheral neuropathy ([place]) 09/27/2014   Healthcare maintenance 09/27/2014   Acute on chronic diastolic (congestive) heart failure ([place]) 06/25/2013   Atrial flutter ([place]) 08/17/2012   Pulmonary hypertension ([place]) 08/16/2012   Right heart failure, NYHA class 3 ([place]) 03/15/2012   Allergic rhinitis 11/16/2011   Diabetes ([place]) 10/15/2010   Hyperlipidemia 10/15/2010   Obesity 10/14/2010   Obstructive sleep apnea 02/21/2009   GERD 11/29/2005    Conditions to be addressed/monitored: HTN and DM  Care Plan : Medication Management  Updates made by Lane Hacker, [place]  since 04/03/2020 12:00 AM     Problem: Health Promotion or Disease Self-Management (General Plan of Care)      Goal: Medication Management   Note:   Current Barriers:  Does not maintain contact with provider office Does not contact provider office for questions/concerns   Pharmacist Clinical Goal(s):  Over the next 30 days, patient will  contact provider office for questions/concerns as evidenced notation of same in electronic health record through collaboration with PharmD and provider.    Interventions: Inter-disciplinary care team collaboration (see longitudinal plan of care) Comprehensive medication review performed; medication list updated in electronic medical record  _0 @ _1 @ _2 @ Health Maintenance  Patient Goals/Self-Care Activities Over the next 30 days, patient will:  - take medications as prescribed collaborate with provider on medication access solutions  Follow Up Plan: The care management team will reach out to the patient again over the next 30 days.      Task: Mutually Develop and Royce Macadamia Achievement of Patient Goals   Note:   Care Management Activities:    - verbalization of feelings encouraged    Notes:      Medication Assistance: None required. Patient affirms current coverage meets needs.   Follow up: Agree  Plan: The care management team will reach out to the patient again over the next 30 days.     [place] Constable, Pharm.D., Managed Medicaid Pharmacist - [phone]

## 2020-07-24 NOTE — Telephone Encounter (Signed)
I called about her refill requests. Stated she does not need cetirizine refilled. Informed pt she should not be out of Metformin, take 1/2 tab- written 45 tabs on 6/29; stated she has been breaking it in half. But stated she needs refill on Xarelto. [place] will be back from lunch @ 1400 PM.  [facility] stated pt is due for a refill on Metformin; they will get it ready for her.  Pt called and informed of the above. Informed I will send refill request for Xarelto.

## 2020-07-24 NOTE — Patient Instructions (Signed)
Visit Information  [name] was given information about Medicaid Managed Care team care coordination services as a part of their Healthy [facility] Medicaid benefit. [name] verbally consented to engagement with the [facility] Managed Care team.   For questions related to your Healthy [facility] health plan, please call: [phone] or visit the homepage here: [email or web address]  If you would like to schedule transportation through your Healthy [facility] plan, please call the following number at least 2 days in advance of your appointment: [phone]  Call the [place] at [phone], at any time, 24 hours a day, 7 days a week. If you are in danger or need immediate medical attention call 911.  If you would like help to quit smoking, call 1-800-QUIT-NOW 239-172-9157) OR Espaol: 1-855-Djelo-Ya (3-295-188-4166) o para ms informacin haga clic aqu or Text READY to 200-400 to register via text  [name] - following are the goals we discussed in your visit today:   Goals Addressed   None     Please see education materials related to DM provided as print materials.   Patient verbalizes understanding of instructions provided today.   The Managed Medicaid care management team will reach out to the patient again over the next 90 days.   [place] Constable, Pharm.D., Managed Medicaid Pharmacist [phone]   Following is a copy of your plan of care:  Patient Care Plan: Heart Failure (Adult)     Problem Identified: Symtom Management      Long-Range Goal: Symptom Exacerbation Prevented or Minimized   Start Date: 12/11/2019  Expected End Date: 05/20/2020  This Visit's Progress: On track  Recent Progress: On track  Priority: High  Note:   Current Barriers:  Chronic Disease Management support and education needs related management of multiple chronic diagnosis; CHF, COPD, OSA and T2DM. Today Ms Barker reports having  episodes of dizziness. This is a new symptom for her. She has not notified her provider. Provider notified, labs collected. Hgb down to 10.8 from 13.2. Patient reports that she has not been taking iron(d/c by physician) and she has not been eating a healthy diet. Dizziness has improved with resting and healthy diet. Discussed recent move with [name]. She recently moved into a studio apartment. She is no longer living with her son, he does continue to pick up her medications. She has a bus stop 1.5 blocks away and knows that she can use medical transportation for appointments. She is adjusting to living on her own. Lacks social connections Does not contact provider office for questions/concerns  Nurse Case Manager Clinical Goal(s):  patient will verbalize understanding of plan for management of new symptoms patient will attend all scheduled medical appointments: Pulmonology 02/26/20 and PCP 04/30/20 patient will demonstrate improved health management independence as evidenced by reporting new symptoms or concerns to provider-Met  Interventions:  Inter-disciplinary care team collaboration (see longitudinal plan of care) Discussed plans with patient for ongoing care management follow up and provided patient with direct contact information for care management team Advised Ms Barker to include more iron rich foods in her diet, education material to be mailed Reviewed upcoming appointments  Therapeutic listening Encouraged patient to contact RNCM if she has any barriers to living on her own or managing her healthcare  Patient Goals/Self-Care Activities Over the next 30 days, patient will: - begin a heart failure diary - eat more whole grains, fruits and vegetables, lean meats and healthy fats - know when to call the doctor - track  symptoms and what helps feel better or worse  -Self administers medications as prescribed  -Calls pharmacy for medication refills - Calls provider office for new  concerns or questions - Incorporate more iron rich foods into your diet - increase activity, doing light exercises 2-3 times a week  Follow Up Plan: Telephone follow up appointment with Managed Medicaid care management team member scheduled for:05/20/20 @ 2:30pm     Patient Care Plan: Health Management     Problem Identified: Hemorrhoid Management      Goal: Plan of care for newly diagnosed hemorrhoids developed Completed 02/13/2020  Start Date: 01/11/2020  Expected End Date: 02/13/2020  Recent Progress: On track  Priority: Medium  Note:   Current Barriers:  Care Coordination needs related to treatment of newly diagnosed hemorrhoids. Ms Barker has history of rectal bleeding and was recently diagnosed with internal and external hemorrhoids while on blood thinners. A referral was placed for surgical consult in December, an appointment has not been scheduled at this time. Transportation barriers Cognitive Deficits  Nurse Case Manager Clinical Goal(s):  Over the next 30 days, patient will verbalize understanding of plan for hemorrhoid treatment-Met-Due to Ms Barker's health conditions, the plan is to improve her diet, increasing fiber, green leafy vegetables, to avoid irritation to hemorrhoids Over the next 30 days, patient will attend all scheduled medical appointments: 02/05/20 @ 10:15 at [facility] for consult-Met Over the next 30 days, patient will work with CM team pharmacist to review medications-Met  Interventions:  Inter-disciplinary care team collaboration (see longitudinal plan of care) Advised patient to avoid constipation, keep bowel movements soft to avoid irritating hemorrhoids, use a sitz bath several times a day Provided education to patient re: hemorrhoids Reviewed medications with patient and discussed taking as directed Collaborated with [facility] [phone] regarding referral-They had attempted to call [name] on 12/24/19. Today we scheduled a  consultation on 02/05/20 @ 10:15am Pharmacy referral for medication review  Patient Goals/Self-Care Activities Over the next 30 days, patient will: -Self administers medications as prescribed -Attends all scheduled provider appointments -Calls pharmacy for medication refills -Calls provider office for new concerns or questions -Avoid constipation or irritation of hemorrhoids -Utilize medical transportation as needed, provided by [employer] [phone]  Follow Up Plan:  No further follow up required       Patient Care Plan: Medication Management     Problem Identified: Health Promotion or Disease Self-Management (General Plan of Care)      Goal: Medication Management   Note:   Current Barriers:  Does not maintain contact with provider office Does not contact provider office for questions/concerns   Pharmacist Clinical Goal(s):  Over the next 30 days, patient will contact provider office for questions/concerns as evidenced notation of same in electronic health record through collaboration with PharmD and provider.    Interventions: Inter-disciplinary care team collaboration (see longitudinal plan of care) Comprehensive medication review performed; medication list updated in electronic medical record  _0 @ _1 @ _2 @ Health Maintenance  Patient Goals/Self-Care Activities Over the next 30 days, patient will:  - take medications as prescribed collaborate with provider on medication access solutions  Follow Up Plan: The care management team will reach out to the patient again over the next 30 days.      Task: Mutually Develop and Royce Macadamia Achievement of Patient Goals   Note:   Care Management Activities:    - verbalization of feelings encouraged    Notes:

## 2020-07-24 NOTE — Telephone Encounter (Signed)
cetirizine (EQ ALLERGY RELIEF, CETIRIZINE,) 10 MG tablet,  metFORMIN (GLUCOPHAGE) 500 MG tablet, REFILL REQUEST @ [place] ([place]), [place].  Per patient the pharmacy is waiting for the office to reply back.

## 2020-07-27 ENCOUNTER — Other Ambulatory Visit: Payer: Self-pay | Admitting: Internal Medicine

## 2020-07-28 DIAGNOSIS — N3946 Mixed incontinence: Secondary | ICD-10-CM | POA: Diagnosis not present

## 2020-07-28 DIAGNOSIS — E119 Type 2 diabetes mellitus without complications: Secondary | ICD-10-CM | POA: Diagnosis not present

## 2020-07-31 ENCOUNTER — Encounter: Payer: Medicaid Other | Admitting: Internal Medicine

## 2020-08-04 DIAGNOSIS — I5030 Unspecified diastolic (congestive) heart failure: Secondary | ICD-10-CM | POA: Diagnosis not present

## 2020-08-04 DIAGNOSIS — G4730 Sleep apnea, unspecified: Secondary | ICD-10-CM | POA: Diagnosis not present

## 2020-08-04 DIAGNOSIS — I270 Primary pulmonary hypertension: Secondary | ICD-10-CM | POA: Diagnosis not present

## 2020-08-04 DIAGNOSIS — J984 Other disorders of lung: Secondary | ICD-10-CM | POA: Diagnosis not present

## 2020-08-07 ENCOUNTER — Other Ambulatory Visit: Payer: Self-pay | Admitting: Internal Medicine

## 2020-08-07 DIAGNOSIS — G629 Polyneuropathy, unspecified: Secondary | ICD-10-CM

## 2020-08-11 ENCOUNTER — Other Ambulatory Visit: Payer: Self-pay | Admitting: Student

## 2020-08-13 ENCOUNTER — Ambulatory Visit: Payer: Medicaid Other | Admitting: Student

## 2020-08-13 DIAGNOSIS — M7989 Other specified soft tissue disorders: Secondary | ICD-10-CM | POA: Insufficient documentation

## 2020-08-13 HISTORY — DX: Other specified soft tissue disorders: M79.89

## 2020-08-13 NOTE — Progress Notes (Signed)
CC: Lower extremity swelling  HPI:  [name] is a 60 y.o. female with a past medical history stated below and presents today for lower extremity swelling. Please see problem based assessment and plan for additional details.  Past Medical History:  Diagnosis Date   Anemia, iron deficiency    secondary to menhorrhagia, on oral iron, also b12 def, getting monthly b12 shots   Cellulitis 05/2019   RIGHT LOWER EXTREMITY   CHF (congestive heart failure) (HCC)    Chronic cough    secondary to alleriges and post nasal drip   Cognitive impairment    COPD (chronic obstructive pulmonary disease) (HCC)    Cor pulmonale (HCC)    PA Peak pressure 62mHg   Depression    Diabetes mellitus    well controlled on metformin   Diastolic heart failure (HCC)    GERD (gastroesophageal reflux disease)    H/O mental retardation    Herpes    Hyperlipidemia    Hypertension    OSA (obstructive sleep apnea)    CPAP   Pulmonary hypertension ([place]    Renal disorder    Restrictive lung disease    PFTs 06/2012 (FVC 54% predicted and FEV1 68% predicted w minimal bronchodilator response).   Shortness of breath    Venous stasis ulcer (HCC)    chornic, ?followed up at wound care center, multiple courses of antibiotics in past for cellulitis, on lasix    Current Outpatient Medications on File Prior to Visit  Medication Sig Dispense Refill   metFORMIN (GLUCOPHAGE) 500 MG tablet TAKE 1/2 (ONE-HALF) TABLET BY MOUTH WITH BREAKFAST 45 tablet 0   ACCU-CHEK AVIVA PLUS test strip USE 1 STRIP TO CHECK GLUCOSE ONCE DAILY 50 each 3   acetaminophen (TYLENOL) 500 MG tablet Take 1 tablet (500 mg total) by mouth every 6 (six) hours as needed. (Patient taking differently: Take 500 mg by mouth every 6 (six) hours as needed (pain).) 30 tablet 0   benzonatate (TESSALON) 200 MG capsule Take 1 capsule (200 mg total) by mouth 3 (three) times daily as needed for cough. 60 capsule 1   cetirizine (EQ ALLERGY RELIEF,  CETIRIZINE,) 10 MG tablet Take 1 tablet (10 mg total) by mouth daily. 90 tablet 1   clobetasol ointment (TEMOVATE) 04.31% Apply 1 application topically as directed. Apply to vulva: qhs x 4wks & then every other night x 4wks, then 2x/week (Patient not taking: No sig reported) 60 g 2   cycloSPORINE (RESTASIS) 0.05 % ophthalmic emulsion Place 1 drop into both eyes 2 (two) times daily as needed (dry eyes).     diphenhydrAMINE (BENADRYL) 25 MG tablet Take 50-100 mg by mouth 2 (two) times daily as needed for itching.     fluticasone (FLONASE) 50 MCG/ACT nasal spray Place 1 spray into both nostrils daily as needed for allergies. 11.1 mL 1   gabapentin (NEURONTIN) 300 MG capsule Take 2 capsules (600 mg total) by mouth 3 (three) times daily. 180 capsule 2   Na Sulfate-K Sulfate-Mg Sulf (SUPREP BOWEL PREP KIT) 17.5-3.13-1.6 GM/177ML SOLN Take 1 kit by mouth as directed. (Patient not taking: No sig reported) 324 mL 0   OXYGEN Inhale 4 L into the lungs continuous.     pantoprazole (PROTONIX) 20 MG tablet Take 1 tablet (20 mg total) by mouth 2 (two) times daily. 180 tablet 0   potassium chloride SA (KLOR-CON M20) 20 MEQ tablet Take 2 tablets (40 mEq total) by mouth 2 (two) times daily. 180 tablet  0   pravastatin (PRAVACHOL) 20 MG tablet Take 1 tablet by mouth once daily (Patient taking differently: Take 20 mg by mouth daily.) 90 tablet 0   rivaroxaban (XARELTO) 20 MG TABS tablet Take 1 tablet (20 mg total) by mouth daily with supper. 90 tablet 2   sildenafil (REVATIO) 20 MG tablet TAKE 1 TABLET THREE TIMES A DAY ( GENERIC FOR REVATIO ) (Patient taking differently: Take 20 mg by mouth 3 (three) times daily.) 90 tablet 2   spironolactone (ALDACTONE) 25 MG tablet Take 1/2 (one-half) tablet by mouth once daily (Patient taking differently: 12.5 mg daily.) 45 tablet 0   torsemide (DEMADEX) 20 MG tablet TAKE 4 TABLETS BY MOUTH TWICE DAILY 240 tablet 0   treprostinil (REMODULIN) 5 MG/ML SOLN injection See admin  instructions. As of 08/09/2019: Add 7 ml of Remodulin to cassette and 93 ml of sterile diluent for Remodulin to cassetet for a total volume of 100 ml to make a concentration of 350,000 ng per ml.  Infuse via a CADD pump intravenously at a rate of 40 ml per 24 hours. Based on a dosing weight of 120 kg, the dose is 81 ng per kg per min.  Once opened, discard Remodulin vial after 30 days.  Sterile Diluent for Remodulin vials are single use only. Once mixed, discard cassette after 48 hours.     vitamin B-12 (CYANOCOBALAMIN) 500 MCG tablet Take 500 mcg by mouth daily.     No current facility-administered medications on file prior to visit.    Family History  Problem Relation Age of Onset   Kidney Stones Son    Mental illness Sister    Bipolar disorder Sister    Mental retardation Brother    Hyperlipidemia Mother    Breast cancer Maternal Aunt    Colon cancer Neg Hx    Pancreatic cancer Neg Hx    Esophageal cancer Neg Hx     Social History   Socioeconomic History   Marital status: Single    Spouse name: Not on file   Number of children: Not on file   Years of education: Not on file   Highest education level: Not on file  Occupational History   Not on file  Tobacco Use   Smoking status: Never   Smokeless tobacco: Never  Vaping Use   Vaping Use: Never used  Substance and Sexual Activity   Alcohol use: No    Alcohol/week: 0.0 standard drinks   Drug use: No   Sexual activity: Not Currently  Other Topics Concern   Not on file  Social History Narrative   Not on file   Social Determinants of Health   Financial Resource Strain: Not on file  Food Insecurity: No Food Insecurity   Worried About Running Out of Food in the Last Year: Never true   [place] in the Last Year: Never true  Transportation Needs: No Transportation Needs   Lack of Transportation (Medical): No   Lack of Transportation (Non-Medical): No  Physical Activity: Not on file  Stress: Not on file  Social  Connections: Moderately Isolated   Frequency of Communication with Friends and Family: Once a week   Frequency of Social Gatherings with Friends and Family: More than three times a week   Attends Religious Services: 1 to 4 times per year   Active Member of [employer] or Organizations: No   Attends Archivist Meetings: Never   Marital Status: Never married  Intimate Partner Violence: Not  on file    Review of Systems: ROS negative except for what is noted on the assessment and plan.  Vitals:   08/13/20 1031  BP: 118/65  Pulse: 87  Temp: 98.6 F (37 C)  TempSrc: Oral  SpO2: 97%  Weight: 273 lb 11.2 oz (124.1 kg)   Physical Exam: Constitutional: Obese, no acute distress HENT: normocephalic atraumatic Eyes: conjunctiva non-erythematous Neck: supple Cardiovascular: regular rate and rhythm, no m/r/g.  1-2+ lower extremity edema Pulmonary/Chest: normal work of breathing on 4 L nasal cannula MSK: normal bulk and tone Neurological: alert & oriented x 3, 5/5 strength in bilateral upper and lower extremities, normal gait Skin: warm and dry.  Discoloration halfway down lower extremity.  Area is not warmer to touch than the rest of her extremity.  No varicose veins present Psych: Normal mood and thought process       Assessment & Plan:   See Encounters Tab for problem based charting.  Patient discussed with Dr. Donnita Falls, D.O. [facility], PGY-2 Pager: [phone], Phone: [phone] Date 08/13/2020 Time 5:41 PM

## 2020-08-13 NOTE — Patient Instructions (Signed)
Thank you, Ms.Elky A Symonette for allowing [place] to provide your care today. Today we discussed  Leg swelling swelling We wrapped her legs today with wraps.  If you feel as though they are cutting off circulation or you having increased pain in her legs please take them off and call our clinic.  I would like you to follow-up in 1 week to see how you are doing and talk with Dr. Howie Ill your primary care provider.  You may need referral to a vascular surgeon to discuss this further.  We can also continue to do these wraps weekly in our clinic if need be.  Please call us if you have any further questions about the wraps.  I have ordered the following labs for you:  Lab Orders  No laboratory test(s) ordered today     Referrals ordered today:   Referral Orders  No referral(s) requested today     I have ordered the following medication/changed the following medications:   Stop the following medications: There are no discontinued medications.   Start the following medications: No orders of the defined types were placed in this encounter.    Follow up:  1 week follow-up to meet primary care provider and follow-up on lower extremity swelling    Should you have any questions or concerns please call the internal medicine clinic at [phone].     Sanjuana Letters, D.O. [place]

## 2020-08-13 NOTE — Assessment & Plan Note (Signed)
Assessment: Patient with bilateral lower extremity edema for the past 10+ years.  She states that it started with swelling and discoloration of her left lower extremity but then started on her right as well.  She states a history of cellulitis and ulcers that have occurred but denies any recently.  She has had ABIs performed in 2019 that were unremarkable.  She also had venous studies that she states were also unremarkable, these were done in 2014 or so.  Since then she has been dealing with chronic swelling of her lower extremities.  Suspect this is a combination of venous insufficiency as well as lymphedema.  She is unable to tolerate compression stockings in the past due to feeling as though they were too tight she does also not elevate her legs in the evening.  Discussed with her that she will need to continue to elevate her legs as well as keep them wrapped.  Unna boots will be placed today during her visit.  We do not have the local lymphedema clinic, patient does follow with [facility] for her pulmonary hypertension and this may be a way for her to also be seen by their lymphedema clinic to assist her with the lower extremity swelling.   Plan: -Unna boots placed today -Patient to follow-up with PCP in 1 week, can discuss further plans from there.  Would recommend placing Unna boots weekly with possible follow-up with lymphedema clinic at [facility] as we do not have one locally

## 2020-08-19 ENCOUNTER — Other Ambulatory Visit: Payer: Self-pay | Admitting: *Deleted

## 2020-08-19 NOTE — Patient Instructions (Signed)
Visit Information  [name]  - as a part of your Medicaid benefit, you are eligible for care management and care coordination services at no cost or copay. I was unable to reach you by phone today but would be happy to help you with your health related needs. Please feel free to call me @ [phone].   A member of the Managed Medicaid care management team will reach out to you again over the next 21 days.   [name] RN, BSN [place]  [employer] manager

## 2020-08-19 NOTE — Patient Outreach (Signed)
Care Coordination  08/19/2020  [name] August 15, 1960 [id number]   Medicaid Managed Care   Unsuccessful Outreach Note  08/19/2020 Name: [name] MRN: [id number] DOB: 19-Aug-1960  Referred by: Masters, [name], DO Reason for referral : High Risk Managed Medicaid (Unsuccessful RNCM follow up outreach)   A second unsuccessful telephone outreach was attempted today. The patient was referred to the case management team for assistance with care management and care coordination.   Follow Up Plan: A HIPAA compliant phone message was left for the patient providing contact information and requesting a return call.   [name] RN, BSN [place]  [employer] manager

## 2020-08-19 NOTE — Progress Notes (Signed)
Internal Medicine Clinic Attending  Case discussed with Dr. Katsadouros  At the time of the visit.  We reviewed the resident's history and exam and pertinent patient test results.  I agree with the assessment, diagnosis, and plan of care documented in the resident's note.  

## 2020-08-20 ENCOUNTER — Encounter: Payer: Medicaid Other | Admitting: Internal Medicine

## 2020-08-27 DIAGNOSIS — N3946 Mixed incontinence: Secondary | ICD-10-CM | POA: Diagnosis not present

## 2020-08-27 DIAGNOSIS — E119 Type 2 diabetes mellitus without complications: Secondary | ICD-10-CM | POA: Diagnosis not present

## 2020-08-31 ENCOUNTER — Other Ambulatory Visit: Payer: Self-pay

## 2020-08-31 ENCOUNTER — Emergency Department (HOSPITAL_COMMUNITY): Payer: Medicaid Other

## 2020-08-31 ENCOUNTER — Emergency Department (HOSPITAL_COMMUNITY)
Admission: EM | Admit: 2020-08-31 | Discharge: 2020-08-31 | Disposition: A | Discharge status: Home / Self Care | Payer: Medicaid Other | Attending: Emergency Medicine | Admitting: Emergency Medicine

## 2020-08-31 ENCOUNTER — Encounter (HOSPITAL_COMMUNITY): Payer: Self-pay

## 2020-08-31 DIAGNOSIS — Z91040 Latex allergy status: Secondary | ICD-10-CM | POA: Insufficient documentation

## 2020-08-31 DIAGNOSIS — E119 Type 2 diabetes mellitus without complications: Secondary | ICD-10-CM | POA: Diagnosis not present

## 2020-08-31 DIAGNOSIS — H538 Other visual disturbances: Secondary | ICD-10-CM | POA: Insufficient documentation

## 2020-08-31 DIAGNOSIS — Z7901 Long term (current) use of anticoagulants: Secondary | ICD-10-CM | POA: Diagnosis not present

## 2020-08-31 DIAGNOSIS — R079 Chest pain, unspecified: Secondary | ICD-10-CM | POA: Diagnosis not present

## 2020-08-31 DIAGNOSIS — R609 Edema, unspecified: Secondary | ICD-10-CM | POA: Diagnosis not present

## 2020-08-31 DIAGNOSIS — R0689 Other abnormalities of breathing: Secondary | ICD-10-CM | POA: Diagnosis not present

## 2020-08-31 DIAGNOSIS — H539 Unspecified visual disturbance: Secondary | ICD-10-CM

## 2020-08-31 DIAGNOSIS — I110 Hypertensive heart disease with heart failure: Secondary | ICD-10-CM | POA: Insufficient documentation

## 2020-08-31 DIAGNOSIS — J449 Chronic obstructive pulmonary disease, unspecified: Secondary | ICD-10-CM | POA: Diagnosis not present

## 2020-08-31 DIAGNOSIS — I5033 Acute on chronic diastolic (congestive) heart failure: Secondary | ICD-10-CM | POA: Insufficient documentation

## 2020-08-31 DIAGNOSIS — Z79899 Other long term (current) drug therapy: Secondary | ICD-10-CM | POA: Insufficient documentation

## 2020-08-31 DIAGNOSIS — I517 Cardiomegaly: Secondary | ICD-10-CM | POA: Diagnosis not present

## 2020-08-31 DIAGNOSIS — I10 Essential (primary) hypertension: Secondary | ICD-10-CM | POA: Diagnosis not present

## 2020-08-31 DIAGNOSIS — R0902 Hypoxemia: Secondary | ICD-10-CM | POA: Diagnosis not present

## 2020-08-31 LAB — CBC WITH DIFFERENTIAL/PLATELET
Abs Immature Granulocytes: 0 10*3/uL (ref 0.00–0.07)
Basophils Absolute: 0 10*3/uL (ref 0.0–0.1)
Basophils Relative: 0 %
Eosinophils Absolute: 0 10*3/uL (ref 0.0–0.5)
Eosinophils Relative: 0 %
HCT: 38.2 % (ref 36.0–46.0)
Hemoglobin: 11.3 g/dL — ABNORMAL LOW (ref 12.0–15.0)
Lymphocytes Relative: 27 %
Lymphs Abs: 2.8 10*3/uL (ref 0.7–4.0)
MCH: 21.4 pg — ABNORMAL LOW (ref 26.0–34.0)
MCHC: 29.6 g/dL — ABNORMAL LOW (ref 30.0–36.0)
MCV: 72.3 fL — ABNORMAL LOW (ref 80.0–100.0)
Monocytes Absolute: 0.6 10*3/uL (ref 0.1–1.0)
Monocytes Relative: 6 %
Neutro Abs: 7 10*3/uL (ref 1.7–7.7)
Neutrophils Relative %: 67 %
Platelets: 553 10*3/uL — ABNORMAL HIGH (ref 150–400)
RBC: 5.28 MIL/uL — ABNORMAL HIGH (ref 3.87–5.11)
RDW: 22.4 % — ABNORMAL HIGH (ref 11.5–15.5)
WBC: 10.4 10*3/uL (ref 4.0–10.5)
nRBC: 0 % (ref 0.0–0.2)
nRBC: 9 /100 WBC — ABNORMAL HIGH

## 2020-08-31 LAB — BASIC METABOLIC PANEL
Anion gap: 8 (ref 5–15)
BUN: 11 mg/dL (ref 6–20)
CO2: 28 mmol/L (ref 22–32)
Calcium: 8.7 mg/dL — ABNORMAL LOW (ref 8.9–10.3)
Chloride: 99 mmol/L (ref 98–111)
Creatinine, Ser: 0.67 mg/dL (ref 0.44–1.00)
GFR, Estimated: >60 mL/min (ref >60)
Glucose, Bld: 101 mg/dL — ABNORMAL HIGH (ref 70–99)
Potassium: 3.8 mmol/L (ref 3.5–5.1)
Sodium: 135 mmol/L (ref 135–145)

## 2020-08-31 LAB — TROPONIN I (HIGH SENSITIVITY)
Troponin I (High Sensitivity): 7 ng/L (ref <18)
Troponin I (High Sensitivity): 5 ng/L (ref <18)

## 2020-08-31 LAB — BRAIN NATRIURETIC PEPTIDE: B Natriuretic Peptide: 32.3 pg/mL (ref 0.0–100.0)

## 2020-08-31 NOTE — ED Notes (Signed)
Patient verbalizes understanding of discharge instructions. Follow-up care reviewed. Opportunity for questioning and answers were provided. Armband removed by staff, pt discharged from ED via wheelchair to lobby to wait on son.

## 2020-08-31 NOTE — ED Notes (Signed)
Pt transported to xray 

## 2020-08-31 NOTE — ED Triage Notes (Signed)
Pt bib [facility] from home c/o and visual disturbances. Pt reported "seeing lines". Hx of right sided heart failure, bilateral LE cellulitis, temp 100.3 per ems, hot to the touch.

## 2020-08-31 NOTE — ED Notes (Addendum)
Pt states that her right eye is very blurry and she can only see the letters as blurry lines.

## 2020-08-31 NOTE — Discharge Instructions (Addendum)
Your chest x-ray and blood work here were reassuring.  There is no evidence of pneumonia or a heart attack.  For your vision problems, please call your eye doctor to see if he can be seen earlier in clinic.  These chronic degenerative changes can be evaluated in the outpatient setting.  If your symptoms significantly change or worsen in a way that is concerning to you, please do not hesitate to return to the emergency department.

## 2020-08-31 NOTE — ED Provider Notes (Signed)
[facility] EMERGENCY DEPARTMENT Provider Note   CSN: [id number] Arrival date & time: 08/31/20  1841     History Chief Complaint  Patient presents with   Visual Field Change    [name] is a 60 y.o. female.  HPI  60 year old female with a past medical history of COPD, chronic hypoxic respiratory failure on supplemental oxygen at baseline, severe pulmonary hypertension on continuous prostaglandin infusion, cor pulmonale presenting to the emergency department with chest pain and visual disturbances.  EMS report that they were first called out to the home for chest pain.  They state that upon getting there, the patient only reported seeing black lines in her vision.  Currently in the emergency department, patient states that she is asymptomatic.  She states that earlier today, she got up to make tea.  She states that she had an episode of lightheadedness and then saw a black squiggly lines in her vision bilaterally.  She states that she has floaters in her vision at all times and is scheduled to see an eye doctor in October of this year.  She states that she has never seen black squiggly lines before.  She denies any visual field cuts.  She denies any double vision.  She states that the black squiggly lines resolved while she was in the ambulance and she does not currently have any visual deficits other than her baseline.  She denies any headache.  She denies a recent fall.  She denies any weakness or numbness.  For her chest pain, the patient reports that while she was drinking some tea, she had some substernal mild chest tightness sensation.  She states that it resolved spontaneously in several minutes and was not associated with exertion or inspiration.  She has never had similar chest pain, but states that she suspects it was due to drinking hot tea.  Past Medical History:  Diagnosis Date   Anemia, iron deficiency    secondary to menhorrhagia, on oral iron, also b12  def, getting monthly b12 shots   Cellulitis 05/2019   RIGHT LOWER EXTREMITY   CHF (congestive heart failure) (HCC)    Chronic cough    secondary to alleriges and post nasal drip   Cognitive impairment    COPD (chronic obstructive pulmonary disease) (HCC)    Cor pulmonale (HCC)    PA Peak pressure 67mHg   Depression    Diabetes mellitus    well controlled on metformin   Diastolic heart failure (HCC)    GERD (gastroesophageal reflux disease)    H/O mental retardation    Herpes    Hyperlipidemia    Hypertension    OSA (obstructive sleep apnea)    CPAP   [place]    Renal disorder    Restrictive lung disease    PFTs 06/2012 (FVC 54% predicted and FEV1 68% predicted w minimal bronchodilator response).   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   Bleeding from the genitourinary system 09/13/2019   Gross  hematuria 09/13/2019   Chronic venous stasis 05/07/2019   Urinary incontinence 12/12/2018   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)    Acute on chronic respiratory failure ([place]) 06/15/2018   Right ovarian cyst 05/19/2017   Peripheral neuropathy ([place]) 09/27/2014   Healthcare maintenance 09/27/2014   Acute on chronic diastolic (congestive) heart failure ([place]) 06/25/2013   Atrial flutter ([place]) 08/17/2012   Pulmonary hypertension ([place]) 08/16/2012   Right heart failure, NYHA class 3 ([place]) 03/15/2012   Allergic rhinitis 11/16/2011   Diabetes ([place]) 10/15/2010    Hyperlipidemia 10/15/2010   Obesity 10/14/2010   Obstructive sleep apnea 02/21/2009   GERD 11/29/2005    Past Surgical History:  Procedure Laterality Date   CARDIAC CATHETERIZATION N/A 01/13/2016   Procedure: Right Heart Cath;  Surgeon: Jolaine Artist, MD;  Location: [facility];  Service: Cardiovascular;  Laterality: N/A;   CHOLECYSTECTOMY     COLONOSCOPY WITH PROPOFOL N/A 12/20/2019   Procedure: COLONOSCOPY WITH PROPOFOL;  Surgeon: [name], MD;  Location: [facility];  Service: Endoscopy;  Laterality: N/A;   IR FLUORO GUIDE CV LINE LEFT  11/16/2019   IR FLUORO GUIDE CV LINE LEFT  06/17/2020   IR FLUORO GUIDE CV LINE LEFT  07/04/2020   IR FLUORO GUIDE CV LINE RIGHT  05/09/2019   IR FLUORO GUIDE CV LINE RIGHT  08/12/2019   IR REMOVAL TUN CV CATH W/O FL  05/05/2019   IR US GUIDE VASC ACCESS LEFT  11/16/2019   IR US GUIDE VASC ACCESS LEFT  06/17/2020   IR US GUIDE VASC ACCESS LEFT  07/04/2020   IR US GUIDE VASC ACCESS RIGHT  05/09/2019   IR US GUIDE VASC ACCESS RIGHT  08/12/2019   IR US GUIDE VASC ACCESS RIGHT  11/16/2019   IR VENO/JUGULAR RIGHT  11/16/2019   LEFT AND RIGHT HEART CATHETERIZATION WITH CORONARY ANGIOGRAM N/A 02/28/2014   Procedure: LEFT AND RIGHT HEART CATHETERIZATION WITH CORONARY ANGIOGRAM;  Surgeon: Jolaine Artist, MD;  Location: [facility] CATH LAB;  Service: Cardiovascular;  Laterality: N/A;   RIGHT HEART CATH N/A 06/27/2018   Procedure: RIGHT HEART CATH;  Surgeon: Jolaine Artist, MD;  Location: [facility];  Service: Cardiovascular;  Laterality: N/A;   RIGHT/LEFT HEART CATH AND CORONARY ANGIOGRAPHY N/A 05/14/2019   Procedure: RIGHT/LEFT HEART CATH AND CORONARY ANGIOGRAPHY;  Surgeon: Jolaine Artist, MD;  Location: [facility];  Service: Cardiovascular;  Laterality: N/A;     OB History     Gravida  1   Para  1   Term  1   Preterm      AB      Living  1      SAB      IAB      Ectopic      Multiple      Live Births               Family History  Problem Relation Age of Onset   Kidney Stones Son    Mental illness Sister    Bipolar disorder Sister    Mental retardation Brother    Hyperlipidemia Mother    Breast cancer Maternal Aunt    Colon cancer Neg Hx    Pancreatic cancer Neg Hx    Esophageal cancer Neg Hx     Social History   Tobacco Use   Smoking status: Never   Smokeless tobacco: Never  Vaping Use   Vaping Use: Never used  Substance Use Topics   Alcohol use: No    Alcohol/week: 0.0 standard drinks   Drug use: No    Home Medications Prior to Admission medications   Medication Sig Start Date End Date Taking? Authorizing Provider  metFORMIN (GLUCOPHAGE) 500 MG tablet TAKE 1/2 (ONE-HALF) TABLET BY MOUTH WITH BREAKFAST 07/28/20   Sanjuan Dame, MD  ACCU-CHEK AVIVA PLUS test strip USE 1 STRIP TO CHECK GLUCOSE ONCE DAILY 05/25/18   [name], MD  acetaminophen (TYLENOL) 500 MG tablet Take 1 tablet (500 mg total) by mouth every 6 (six) hours as needed. Patient taking differently: Take 500 mg by mouth every 6 (six) hours as needed (pain). 08/21/15   [name], MD  benzonatate (TESSALON) 200 MG capsule Take 1 capsule (200 mg total) by mouth 3 (three) times daily as needed for cough. 03/26/20 03/26/21  Laurin Coder, MD  cetirizine (EQ ALLERGY RELIEF, CETIRIZINE,) 10 MG tablet Take 1 tablet (10 mg total) by mouth daily. 05/13/20   [name], MD  clobetasol ointment (TEMOVATE) 5.18 % Apply 1 application topically as directed. Apply to vulva: qhs x 4wks & then every other night x 4wks, then 2x/week Patient not taking: No sig reported 10/15/19   [name], MD  cycloSPORINE (RESTASIS) 0.05 % ophthalmic emulsion Place 1 drop into both eyes 2 (two) times daily as needed (dry eyes).    [provider]  diphenhydrAMINE (BENADRYL) 25 MG tablet Take 50-100 mg by mouth 2 (two) times daily as needed for itching.    [provider]  fluticasone (FLONASE) 50  MCG/ACT nasal spray Place 1 spray into both nostrils daily as needed for allergies. 05/13/20 05/13/21  [name], MD  gabapentin (NEURONTIN) 300 MG capsule Take 2 capsules (600 mg total) by mouth 3 (three) times daily. 08/08/20 11/06/20  Marianna Payment, MD  Na Sulfate-K Sulfate-Mg Sulf (SUPREP BOWEL PREP KIT) 17.5-3.13-1.6 GM/177ML SOLN Take 1 kit by mouth as directed. Patient not taking: No sig reported 11/23/19   [name], MD  OXYGEN Inhale 4 L into the lungs continuous.    [provider]  pantoprazole (PROTONIX) 20 MG tablet Take 1 tablet (20 mg total) by mouth 2 (two) times daily. 05/13/20   [name], MD  potassium chloride SA (KLOR-CON M20) 20 MEQ tablet Take 2 tablets (40 mEq total) by mouth 2 (two) times daily. 05/13/20   [name], MD  pravastatin (PRAVACHOL) 20 MG tablet Take 1 tablet by mouth once daily Patient taking differently: Take 20 mg by mouth daily. 06/23/20   [name], MD  rivaroxaban (XARELTO) 20 MG TABS tablet Take 1 tablet (20 mg total) by mouth daily with supper. 07/24/20   Sanjuan Dame, MD  sildenafil (REVATIO) 20 MG tablet TAKE 1 TABLET THREE TIMES A DAY ( GENERIC FOR REVATIO ) Patient taking differently: Take 20 mg by mouth 3 (three) times daily. 05/02/20   Bensimhon, [name], MD  spironolactone (ALDACTONE) 25 MG tablet Take 1/2 (one-half) tablet by mouth once daily Patient taking differently: 12.5 mg daily. 03/28/20   [name], MD  torsemide (DEMADEX) 20 MG tablet TAKE 4 TABLETS BY MOUTH TWICE DAILY 08/13/20   Marianna Payment, MD  treprostinil (REMODULIN) 5 MG/ML SOLN injection See admin instructions. As of 08/09/2019: Add 7 ml of Remodulin to cassette and 93 ml of sterile diluent for Remodulin to cassetet for a total volume of 100 ml to make a concentration of 350,000 ng per ml.  Infuse via  a CADD pump intravenously at a rate of 40 ml per 24 hours. Based on a dosing weight of 120 kg, the dose is 81 ng per kg per min.  Once  opened, discard Remodulin vial after 30 days.  Sterile Diluent for Remodulin vials are single use only. Once mixed, discard cassette after 48 hours.    [provider]  vitamin B-12 (CYANOCOBALAMIN) 500 MCG tablet Take 500 mcg by mouth daily.    [provider]    Allergies    Aspirin, Codeine, Lisinopril, Sulfonamide derivatives, and Latex  Review of Systems   Review of Systems  Constitutional:  Negative for chills and fever.  HENT:  Negative for ear pain and sore throat.   Eyes:  Positive for visual disturbance. Negative for photophobia, pain, discharge, redness and itching.  Respiratory:  Negative for cough and shortness of breath.   Cardiovascular:  Positive for chest pain. Negative for palpitations.  Gastrointestinal:  Negative for abdominal pain and vomiting.  Genitourinary:  Negative for dysuria and hematuria.  Musculoskeletal:  Negative for arthralgias and back pain.  Skin:  Negative for color change and rash.  Neurological:  Negative for seizures and syncope.  All other systems reviewed and are negative.  Physical Exam Updated Vital Signs BP (!) 115/47   Pulse 83   Temp 98.5 F (36.9 C) (Oral)   Resp 15   Ht _0  (1.6 m)   Wt 124 kg   LMP 02/19/2011   SpO2 93%   BMI 48.43 kg/m   Physical Exam  ED Results / Procedures / Treatments   Labs (all labs ordered are listed, but only abnormal results are displayed) Labs Reviewed  CBC WITH DIFFERENTIAL/PLATELET - Abnormal; Notable for the following components:      Result Value   RBC 5.28 (*)    Hemoglobin 11.3 (*)    MCV 72.3 (*)    MCH 21.4 (*)    MCHC 29.6 (*)    RDW 22.4 (*)    Platelets 553 (*)    nRBC 9 (*)    All other components within normal limits  BASIC METABOLIC PANEL - Abnormal; Notable for the following components:   Glucose, Bld 101 (*)    Calcium 8.7 (*)    All other components within normal limits  BRAIN NATRIURETIC PEPTIDE  TROPONIN I (HIGH SENSITIVITY)  TROPONIN I (HIGH  SENSITIVITY)    EKG EKG Interpretation  Date/Time:  Sunday August 31 2020 18:52:43 EDT Ventricular Rate:  89 PR Interval:  214 QRS Duration: 94 QT Interval:  378 QTC Calculation: 460 R Axis:   117 Text Interpretation: Sinus rhythm Prolonged PR interval Right axis deviation Low voltage, precordial leads Borderline T abnormalities, anterior leads No significant change since last tracing Confirmed by Gareth Morgan [phone]) on 08/31/2020 7:45:20 PM  Radiology DG Chest 2 View  Result Date: 08/31/2020 CLINICAL DATA:  Chest pain. EXAM: CHEST - 2 VIEW COMPARISON:  Earlier radiograph dated 08/31/2020. FINDINGS: Similar positioning of the left-sided central venous line with tip in central SVC close to the cavoatrial junction. There are bibasilar atelectasis or infiltrate. There is cardiomegaly with mild vascular congestion. Bilateral hilar prominence likely representing a degree of pulmonary hypertension. No pleural effusion pneumothorax. No acute osseous pathology. Osteopenia with degenerative changes of the spine. IMPRESSION: 1. Cardiomegaly with mild vascular congestion. 2. Bibasilar atelectasis or infiltrate. Electronically Signed   By: Anner Crete M.D.   On: 08/31/2020 22:52   DG Chest Portable 1 View  Result Date:  08/31/2020 CLINICAL DATA:  Chest pain EXAM: PORTABLE CHEST 1 VIEW COMPARISON:  May 04, 2019 and July 02, 2019 FINDINGS: The right hilum is mildly prominent but this is unchanged in appearance. Stable cardiomegaly. The mediastinum is unremarkable. No pneumothorax. Probable atelectasis in the left base with obscuration left hemidiaphragm, stable. No focal infiltrate. IMPRESSION: Mild prominence of the right hilum is a stable finding and probably confluence of shadows. Recommend a PA and lateral chest x-ray before discharge for better evaluation. A left-sided line terminates in the central SVC. Probable atelectasis in the left base, unchanged. No other acute abnormalities.  Electronically Signed   By: Dorise Bullion III M.D.   On: 08/31/2020 20:06    Procedures Procedures   Medications Ordered in ED Medications - No data to display  ED Course  I have reviewed the triage vital signs and the nursing notes.  Pertinent labs & imaging results that were available during my care of the patient were reviewed by me and considered in my medical decision making (see chart for details).    MDM Rules/Calculators/A&P                           60 year old female with above past medical history presenting to the emergency department with visual disturbances and chest pain.  Vital signs reviewed on arrival, within acceptable limits.  Patient is on her home oxygen.  The patient describes very atypical chest pain and she is now currently chest pain-free.  EKG shows some T wave abnormalities in the anterior leads, but these were present on her EKG in 2021.  Overall, no anatomic ST elevations or ST depressions that be concerning for acute ischemia.  Chest x-ray without any evidence of pneumonia or pneumothorax.  Presentation not consistent with aortic dissection or pulmonary embolism.  Although her chest pain is atypical, obtained a delta troponin and BNP given her severe pulmonary hypertension and tenuous cardiac status, but as well as troponin and BNP were all within acceptable limits.  Her chest pain seems most likely to be something innocuous like MSK or GERD.  For her visual disturbances, she denies any headache, recent head trauma.  Her pupils reactive, no concern for acute angle-closure glaucoma.  She states that she no longer has any squiggly lines in her visions like she was having earlier.  On visual acuity testing, the patient essentially only has fuzzy light and dark differentiation in her right eye.  On further interview, the patient states that this is not new but is been present for over a year.  She is no longer having any acute visual changes.  Given that she is  currently asymptomatic and the visual deficits that she was originally describing seem to been going on for a long time, I believe that she is stable for discharge from the emergency department this time with close follow-up with her ophthalmologist.  Patient states that she is comfortable with this plan will call her eye doctor tomorrow.  Strict return precautions discussed.  Final Clinical Impression(s) / ED Diagnoses Final diagnoses:  Visual disturbance    Rx / DC Orders ED Discharge Orders     None        Claud Kelp, MD 08/31/20 2338    Gareth Morgan, MD 09/01/20 2312

## 2020-09-01 ENCOUNTER — Encounter: Payer: Self-pay | Admitting: Internal Medicine

## 2020-09-01 ENCOUNTER — Telehealth: Payer: Self-pay | Admitting: *Deleted

## 2020-09-01 DIAGNOSIS — K648 Other hemorrhoids: Secondary | ICD-10-CM

## 2020-09-01 DIAGNOSIS — K644 Residual hemorrhoidal skin tags: Secondary | ICD-10-CM

## 2020-09-01 NOTE — Progress Notes (Signed)
CC: follow-up for peripheral edema  HPI:  [name] is a 60 y.o. with medical history as below presenting to [facility] for follow-up for peripheral edema.  Please see problem-based list for further details, assessments, and plans.  Past Medical History:  Diagnosis Date   Acute on chronic diastolic (congestive) heart failure ([place]) 06/25/2013   Acute on chronic respiratory failure ([place]) 06/15/2018   Anemia, iron deficiency    secondary to menhorrhagia, on oral iron, also b12 def, getting monthly b12 shots   Cellulitis 05/2019   RIGHT LOWER EXTREMITY   CHF (congestive heart failure) (HCC)    Chronic cough    secondary to alleriges and post nasal drip   Cognitive impairment    COPD (chronic obstructive pulmonary disease) (HCC)    Cor pulmonale (HCC)    PA Peak pressure 52mHg   Depression    Diabetes mellitus    well controlled on metformin   Diastolic heart failure (HCC)    GERD (gastroesophageal reflux disease)    H/O mental retardation    Herpes    Hyperlipidemia    Hypertension    OSA (obstructive sleep apnea)    CPAP   Pulmonary hypertension ([place]    Renal disorder    Restrictive lung disease    PFTs 06/2012 (FVC 54% predicted and FEV1 68% predicted w minimal bronchodilator response).   Shortness of breath    Venous stasis ulcer (HCC)    chornic, ?followed up at wound care center, multiple courses of antibiotics in past for cellulitis, on lasix    Review of Systems:   Psychiatric/Behavioral: endorses depressed moods and anhedonia  Physical Exam:  Vitals:   09/02/20 1512  BP: 124/72  Pulse: 83  Temp: 98.1 F (36.7 C)  TempSrc: Oral  SpO2: 95%  Weight: 278 lb 4.8 oz (126.2 kg)    General: well-developed, obese, on oxygen sitting in wheelchair HENT: NCAT, no scars noted Eyes: watery discharge present bilaterally, conjunctiva red bilaterally CV: no murmurs, rubs, or gallops, normal rate Pulm: CTAB, pulmonary effort normal GI: no tenderness, bowel  sounds present MSK: port in place, hyperpigmentation of lower extremities bilaterally with skin thickening, 1+ swelling present to knees Skin: lipodermatosclerosis noted on legs bilaterally, scarring noted on forearms  Psych: normal mood and affect  Assessment & Plan:   See Encounters Tab for problem based charting.  Patient seen with Dr. VEvette Doffing

## 2020-09-01 NOTE — Patient Outreach (Signed)
Care Coordination  09/01/2020  [name] Oct 15, 1960 [id number]  Unsuccessful outreach today. RNCM attempted to reach patient for hospital follow up. A detailed HIPAA compliant message was left today, including RNCM contact information. RNCM will reach out to patient again for scheduled visit on 09/04/20 @ 3:30pm.  [name] RN, [place] RN Care Coordinator

## 2020-09-02 ENCOUNTER — Ambulatory Visit (INDEPENDENT_AMBULATORY_CARE_PROVIDER_SITE_OTHER): Payer: Medicaid Other | Admitting: Internal Medicine

## 2020-09-02 VITALS — BP 124/72 | HR 83 | Temp 98.1°F | Wt 278.3 lb

## 2020-09-02 DIAGNOSIS — M7989 Other specified soft tissue disorders: Secondary | ICD-10-CM | POA: Diagnosis not present

## 2020-09-02 DIAGNOSIS — H539 Unspecified visual disturbance: Secondary | ICD-10-CM

## 2020-09-02 DIAGNOSIS — F32A Depression, unspecified: Secondary | ICD-10-CM | POA: Diagnosis not present

## 2020-09-02 DIAGNOSIS — E1142 Type 2 diabetes mellitus with diabetic polyneuropathy: Secondary | ICD-10-CM | POA: Diagnosis not present

## 2020-09-02 HISTORY — DX: Unspecified visual disturbance: H53.9

## 2020-09-02 NOTE — Assessment & Plan Note (Signed)
Patient with bilateral lower extremity edema.  She was recently seen in clinic 08/10 for this and Unna boots were placed.  She did not follow-up in one week and removed wrapping herself.  Previously has had ABIs performed and venous studies. Significant hyperpigmentation and thickening of lower extremities seen bilaterally. Unable to tolerate compression stockings due to difficulty with finding the correct size.  She also does not elevate her legs due to not having room to do that in the apartment she is in.  This is most likely due to combination of venous insufficiency and lymphedema.   Plan: -Referral to Lymphedema clinic with PT in [facility] -podiatry referral

## 2020-09-02 NOTE — Patient Instructions (Addendum)
Ms.Skyeler A Loomer, it was a pleasure seeing you today!  Today we discussed: Swelling in legs- I am referring you to a Lymphedema Clinic.  You will get a call to set up this appointment.  I also am referring you to podiatry to help with cutting toenails.    Vision changes- please follow-up with your eye doctor in October if not sooner.  If you have worsening headache with vision changes or worsening of vision changes, please go to the ED.  Depression- I sent in a referral to Dr. Theodis Shove.  She is our psychologist here.  She will be calling you to set up an appointment  I have ordered the following labs today:  Lab Orders  No laboratory test(s) ordered today     Tests ordered today:  None  Referrals ordered today:   Referral Orders  No referral(s) requested today     I have ordered the following medication/changed the following medications:   Stop the following medications: There are no discontinued medications.   Start the following medications: No orders of the defined types were placed in this encounter.    Follow-up: 3 months   Please make sure to arrive 15 minutes prior to your next appointment. If you arrive late, you may be asked to reschedule.   We look forward to seeing you next time. Please call our clinic at [phone] if you have any questions or concerns. The best time to call is Monday-Friday from 9am-4pm, but there is someone available 24/7. If after hours or the weekend, call the main hospital number and ask for the Internal Medicine Resident On-Call. If you need medication refills, please notify your pharmacy one week in advance and they will send [place] a request.  Thank you for letting us take part in your care. Wishing you the best!  Thank you, Dr. Hel[facility]

## 2020-09-02 NOTE — Assessment & Plan Note (Signed)
At the end of visit, patient's PHQ-9 was noted to be elevated at 13.  Asked patient about what was causing her symptoms and she states that she has bouts of depression due to her chronic conditions limiting her quality of life.  She is also concerned about the stress this places on her son.  Denies SI or HI.  Patient would like to talk with psychologist to see if that can help with symptoms. Plan: Referral to Dr. Theodis Shove

## 2020-09-02 NOTE — Assessment & Plan Note (Addendum)
Patient presents with recent episode of vision changes.  She states that on 08/28 she was sitting in her chair and noticed "pulsating boxes" in her vision bilaterally.  Called EMS at that time due to concern for heart attack.  In the ED she was evalauted with EKG that showed T wave abnormalities as seen in previous EKGs and CXR was normal.  Since then, she states that she has noticed the boxes on occasion.  No headache associated with vision changes.  She follows with an eye doctor for Diabetes annual exam (no history of retinopathy) and her next appointment is in October.  On physical exam, her peripheral vision was intact in all fields.  She does have chemosis of conjunctiva bilaterally and some clear drainage from eyes bilaterally that she states has been there for a long time Assessment: Symptoms concerning for ocular migraine vs vascular etiology.   Plan: Patient advised to go back to the ED if vision change worsens or accompanied by headache. Encouraged patient to follow-up sooner with her eye doctor if possible.

## 2020-09-04 ENCOUNTER — Other Ambulatory Visit: Payer: Self-pay | Admitting: *Deleted

## 2020-09-04 DIAGNOSIS — J984 Other disorders of lung: Secondary | ICD-10-CM | POA: Diagnosis not present

## 2020-09-04 DIAGNOSIS — I270 Primary pulmonary hypertension: Secondary | ICD-10-CM | POA: Diagnosis not present

## 2020-09-04 DIAGNOSIS — I5030 Unspecified diastolic (congestive) heart failure: Secondary | ICD-10-CM | POA: Diagnosis not present

## 2020-09-04 DIAGNOSIS — G4730 Sleep apnea, unspecified: Secondary | ICD-10-CM | POA: Diagnosis not present

## 2020-09-04 NOTE — Patient Outreach (Signed)
Care Coordination  09/04/2020  KELLEEN STOLZE 07/09/1960 [id number]   Medicaid Managed Care   Unsuccessful Outreach Note  09/04/2020 Name: [name] MRN: [id number] DOB: 10-17-1960  Referred by: Masters, [name], DO Reason for referral : High Risk Managed Medicaid (Unsuccessful RNCM follow up outreach)   A second unsuccessful telephone outreach was attempted today. The patient was referred to the case management team for assistance with care management and care coordination.   Follow Up Plan: A HIPAA compliant phone message was left for the patient providing contact information and requesting a return call.   [name] RN, BSN [place]  [employer] manager

## 2020-09-04 NOTE — Progress Notes (Signed)
Internal Medicine Clinic Attending  I saw and evaluated the patient.  I personally confirmed the key portions of the history and exam documented by Dr. Howie Ill and I reviewed pertinent patient test results.  The assessment, diagnosis, and plan were formulated together and I agree with the documentation in the resident's note.

## 2020-09-04 NOTE — Patient Instructions (Signed)
Visit Information  [name]  - as a part of your Medicaid benefit, you are eligible for care management and care coordination services at no cost or copay. I was unable to reach you by phone today but would be happy to help you with your health related needs. Please feel free to call me @ [phone].   A member of the Managed Medicaid care management team will reach out to you again over the next 14 days.   [name] RN, BSN [place]  [employer] manager

## 2020-09-13 ENCOUNTER — Other Ambulatory Visit: Payer: Self-pay | Admitting: Student

## 2020-09-13 DIAGNOSIS — I50810 Right heart failure, unspecified: Secondary | ICD-10-CM

## 2020-09-15 ENCOUNTER — Encounter: Payer: Self-pay | Admitting: Internal Medicine

## 2020-09-17 ENCOUNTER — Other Ambulatory Visit: Payer: Self-pay | Admitting: *Deleted

## 2020-09-17 NOTE — Patient Outreach (Signed)
Care Coordination  09/17/2020  [name] [birth year] [id number]   Medicaid Managed Care   Unsuccessful Outreach Note  09/17/2020 Name: [name] MRN: [id number] DOB: [birth year]  Referred by: Masters, [name], DO Reason for referral : High Risk Managed Medicaid (Unsuccessful RNCM follow up telephone outreach)   An unsuccessful telephone outreach was attempted today. The patient was referred to the case management team for assistance with care management and care coordination.   Follow Up Plan: The care management team will reach out to the patient again over the next 14 days.   [name] RN, BSN [place]  [employer] manager

## 2020-09-17 NOTE — Patient Instructions (Signed)
Visit Information  [name]  - as a part of your Medicaid benefit, you are eligible for care management and care coordination services at no cost or copay. I was unable to reach you by phone today but would be happy to help you with your health related needs. Please feel free to call me @ [phone].   A member of the Managed Medicaid care management team will reach out to you again over the next 14 days.   Jamez Ambrocio RN, BSN [place]  [facility] RN Care Coordinator   

## 2020-09-18 ENCOUNTER — Other Ambulatory Visit (HOSPITAL_COMMUNITY): Payer: Self-pay | Admitting: Internal Medicine

## 2020-09-18 DIAGNOSIS — I270 Primary pulmonary hypertension: Secondary | ICD-10-CM

## 2020-09-23 ENCOUNTER — Telehealth (HOSPITAL_COMMUNITY): Payer: Self-pay | Admitting: Pharmacist

## 2020-09-23 DIAGNOSIS — I270 Primary pulmonary hypertension: Secondary | ICD-10-CM

## 2020-09-23 MED ORDER — SILDENAFIL CITRATE 20 MG PO TABS: ORAL_TABLET | ORAL | 3 refills | Status: DC

## 2020-09-23 NOTE — Telephone Encounter (Signed)
Refills for sildenafil sent to [facility].   Audry Riles, PharmD, BCPS, BCCP, CPP Heart Failure Clinic Pharmacist [phone]

## 2020-09-24 ENCOUNTER — Other Ambulatory Visit (HOSPITAL_COMMUNITY): Payer: Self-pay | Admitting: *Deleted

## 2020-09-24 DIAGNOSIS — I270 Primary pulmonary hypertension: Secondary | ICD-10-CM

## 2020-09-24 MED ORDER — SILDENAFIL CITRATE 20 MG PO TABS
ORAL_TABLET | ORAL | 3 refills | Status: DC
Start: 2020-09-24 — End: 2020-10-02

## 2020-09-30 DIAGNOSIS — N3946 Mixed incontinence: Secondary | ICD-10-CM | POA: Diagnosis not present

## 2020-09-30 DIAGNOSIS — E119 Type 2 diabetes mellitus without complications: Secondary | ICD-10-CM | POA: Diagnosis not present

## 2020-10-01 ENCOUNTER — Institutional Professional Consult (permissible substitution): Payer: Medicaid Other | Admitting: Behavioral Health

## 2020-10-01 ENCOUNTER — Telehealth: Payer: Self-pay | Admitting: Behavioral Health

## 2020-10-01 NOTE — Telephone Encounter (Signed)
Tried contacting Pt 3 times for today's telehealth session. Unable to lv msg for Pt due to no Mbox capacity.  Dr. Theodis Shove

## 2020-10-02 ENCOUNTER — Telehealth (HOSPITAL_COMMUNITY): Payer: Self-pay | Admitting: Pharmacist

## 2020-10-02 ENCOUNTER — Other Ambulatory Visit: Payer: Self-pay | Admitting: *Deleted

## 2020-10-02 DIAGNOSIS — I270 Primary pulmonary hypertension: Secondary | ICD-10-CM

## 2020-10-02 MED ORDER — SILDENAFIL CITRATE 20 MG PO TABS: ORAL_TABLET | ORAL | 11 refills | Status: DC

## 2020-10-02 NOTE — Patient Outreach (Signed)
Care Coordination  10/02/2020  [name] August 22, 1960 [id number]   Medicaid Managed Care   Unsuccessful Outreach Note  10/02/2020 Name: [name] MRN: [id number] DOB: [birth year]  Referred by: Masters, [name], DO Reason for referral : High Risk Managed Medicaid (Unsuccessful RNCM follow up outreach)   A second unsuccessful telephone outreach was attempted today. The patient was referred to the case management team for assistance with care management and care coordination.   Follow Up Plan: The care management team will reach out to the patient again over the next 14 days.   [name] RN, BSN [place]  [employer] manager

## 2020-10-02 NOTE — Telephone Encounter (Signed)
Sildenafil refills sent to [facility]. Patient prefers to receive from [facility] rather than [facility].

## 2020-10-02 NOTE — Patient Instructions (Signed)
Visit Information  [name]  - as a part of your Medicaid benefit, you are eligible for care management and care coordination services at no cost or copay. I was unable to reach you by phone today but would be happy to help you with your health related needs. Please feel free to call me @ [phone].   A member of the Managed Medicaid care management team will reach out to you again over the next 21 days on 10/23/20 @ 10:30am.   [name] RN, [place] RN Care Coordinator

## 2020-10-04 DIAGNOSIS — I5030 Unspecified diastolic (congestive) heart failure: Secondary | ICD-10-CM | POA: Diagnosis not present

## 2020-10-04 DIAGNOSIS — G4730 Sleep apnea, unspecified: Secondary | ICD-10-CM | POA: Diagnosis not present

## 2020-10-04 DIAGNOSIS — I270 Primary pulmonary hypertension: Secondary | ICD-10-CM | POA: Diagnosis not present

## 2020-10-04 DIAGNOSIS — J984 Other disorders of lung: Secondary | ICD-10-CM | POA: Diagnosis not present

## 2020-10-06 ENCOUNTER — Other Ambulatory Visit: Payer: Self-pay

## 2020-10-06 ENCOUNTER — Encounter (HOSPITAL_COMMUNITY): Payer: Self-pay | Admitting: Internal Medicine

## 2020-10-06 ENCOUNTER — Ambulatory Visit (HOSPITAL_COMMUNITY)
Admission: RE | Admit: 2020-10-06 | Discharge: 2020-10-06 | Disposition: A | Discharge status: Home / Self Care | Payer: Medicaid Other | Source: Ambulatory Visit | Attending: Internal Medicine | Admitting: Internal Medicine

## 2020-10-06 VITALS — BP 102/60 | HR 87 | Wt 284.8 lb

## 2020-10-06 DIAGNOSIS — I872 Venous insufficiency (chronic) (peripheral): Secondary | ICD-10-CM | POA: Diagnosis not present

## 2020-10-06 DIAGNOSIS — J9611 Chronic respiratory failure with hypoxia: Secondary | ICD-10-CM | POA: Insufficient documentation

## 2020-10-06 DIAGNOSIS — I2510 Atherosclerotic heart disease of native coronary artery without angina pectoris: Secondary | ICD-10-CM | POA: Insufficient documentation

## 2020-10-06 DIAGNOSIS — I50810 Right heart failure, unspecified: Secondary | ICD-10-CM | POA: Insufficient documentation

## 2020-10-06 DIAGNOSIS — J449 Chronic obstructive pulmonary disease, unspecified: Secondary | ICD-10-CM | POA: Insufficient documentation

## 2020-10-06 DIAGNOSIS — I272 Pulmonary hypertension, unspecified: Secondary | ICD-10-CM

## 2020-10-06 DIAGNOSIS — I2721 Secondary pulmonary arterial hypertension: Secondary | ICD-10-CM | POA: Insufficient documentation

## 2020-10-06 DIAGNOSIS — I110 Hypertensive heart disease with heart failure: Secondary | ICD-10-CM | POA: Diagnosis not present

## 2020-10-06 DIAGNOSIS — Z79899 Other long term (current) drug therapy: Secondary | ICD-10-CM | POA: Insufficient documentation

## 2020-10-06 DIAGNOSIS — Z888 Allergy status to other drugs, medicaments and biological substances status: Secondary | ICD-10-CM | POA: Insufficient documentation

## 2020-10-06 DIAGNOSIS — Z7984 Long term (current) use of oral hypoglycemic drugs: Secondary | ICD-10-CM | POA: Insufficient documentation

## 2020-10-06 DIAGNOSIS — I4892 Unspecified atrial flutter: Secondary | ICD-10-CM | POA: Insufficient documentation

## 2020-10-06 DIAGNOSIS — E118 Type 2 diabetes mellitus with unspecified complications: Secondary | ICD-10-CM | POA: Diagnosis not present

## 2020-10-06 DIAGNOSIS — I5032 Chronic diastolic (congestive) heart failure: Secondary | ICD-10-CM | POA: Insufficient documentation

## 2020-10-06 DIAGNOSIS — I2781 Cor pulmonale (chronic): Secondary | ICD-10-CM | POA: Insufficient documentation

## 2020-10-06 DIAGNOSIS — Z7901 Long term (current) use of anticoagulants: Secondary | ICD-10-CM | POA: Diagnosis not present

## 2020-10-06 DIAGNOSIS — Z9981 Dependence on supplemental oxygen: Secondary | ICD-10-CM | POA: Diagnosis not present

## 2020-10-06 DIAGNOSIS — I480 Paroxysmal atrial fibrillation: Secondary | ICD-10-CM

## 2020-10-06 LAB — COMPREHENSIVE METABOLIC PANEL
ALT: 8 U/L (ref 0–44)
AST: 18 U/L (ref 15–41)
Albumin: 3.6 g/dL (ref 3.5–5.0)
Alkaline Phosphatase: 49 U/L (ref 38–126)
Anion gap: 6 (ref 5–15)
BUN: 9 mg/dL (ref 6–20)
CO2: 27 mmol/L (ref 22–32)
Calcium: 8.7 mg/dL — ABNORMAL LOW (ref 8.9–10.3)
Chloride: 105 mmol/L (ref 98–111)
Creatinine, Ser: 0.79 mg/dL (ref 0.44–1.00)
GFR, Estimated: >60 mL/min (ref >60)
Glucose, Bld: 102 mg/dL — ABNORMAL HIGH (ref 70–99)
Potassium: 4.1 mmol/L (ref 3.5–5.1)
Sodium: 138 mmol/L (ref 135–145)
Total Bilirubin: 0.8 mg/dL (ref 0.3–1.2)
Total Protein: 6.6 g/dL (ref 6.5–8.1)

## 2020-10-06 LAB — CBC
HCT: 37.6 % (ref 36.0–46.0)
Hemoglobin: 11.3 g/dL — ABNORMAL LOW (ref 12.0–15.0)
MCH: 21 pg — ABNORMAL LOW (ref 26.0–34.0)
MCHC: 30.1 g/dL (ref 30.0–36.0)
MCV: 70 fL — ABNORMAL LOW (ref 80.0–100.0)
Platelets: 557 10*3/uL — ABNORMAL HIGH (ref 150–400)
RBC: 5.37 MIL/uL — ABNORMAL HIGH (ref 3.87–5.11)
RDW: 22.1 % — ABNORMAL HIGH (ref 11.5–15.5)
WBC: 9.5 10*3/uL (ref 4.0–10.5)
nRBC: 0.2 % (ref 0.0–0.2)

## 2020-10-06 LAB — BRAIN NATRIURETIC PEPTIDE: B Natriuretic Peptide: 97.7 pg/mL (ref 0.0–100.0)

## 2020-10-06 MED ORDER — TORSEMIDE 20 MG PO TABS: 80.0000 mg | ORAL_TABLET | Freq: Two times a day (BID) | ORAL | 6 refills | Status: DC

## 2020-10-06 MED ORDER — DOXYCYCLINE HYCLATE 100 MG PO CAPS: 100.0000 mg | ORAL_CAPSULE | Freq: Two times a day (BID) | ORAL | 0 refills | Status: AC

## 2020-10-06 NOTE — Patient Instructions (Signed)
Increase Torsemide to 80 mg (4 tabs) Twice daily   Start Doxycycline 100 mg Twice daily FOR 14 DAYS ONLY)  Labs done today, we will call you for abnormal results  You have been referred to [facility] for nursing care and leg wraps, they will call you to schedule a time   Your physician recommends that you schedule a follow-up appointment in: 6 months (April 2023)  If you have any questions or concerns before your next appointment please send [place] a message through [place] or call our office at [phone].    TO LEAVE A MESSAGE FOR THE NURSE SELECT OPTION 2, PLEASE LEAVE A MESSAGE INCLUDING: YOUR NAME DATE OF BIRTH CALL BACK NUMBER REASON FOR CALL**this is important as we prioritize the call backs  YOU WILL RECEIVE A CALL BACK THE SAME DAY AS LONG AS YOU CALL BEFORE 4:00 PM  At the [facility], you and your health needs are our priority. As part of our continuing mission to provide you with exceptional heart care, we have created designated Provider Care Teams. These Care Teams include your primary Cardiologist (physician) and Advanced Practice Providers (APPs- Physician Assistants and Nurse Practitioners) who all work together to provide you with the care you need, when you need it.   You may see any of the following providers on your designated Care Team at your next follow up: Dr Glori Bickers Dr Loralie Champagne Dr Patrice Paradise, NP Lyda Jester, [place] Ginnie Smart Audry Riles, PharmD   Please be sure to bring in all your medications bottles to every appointment.

## 2020-10-06 NOTE — Progress Notes (Signed)
Advanced Heart Failure Clinic Note   Date:  10/06/2020   ID:  [name], DOB 1960/04/24, MRN [id number]  Location: Home  Provider location: [facility] Type of Visit: Established patient  PCP:  Christiana Fuchs, DO  Cardiologist:  None Primary HF: [place]  Chief Complaint: Heart Failure follow-up   History of Present Illness:  [name] is a 60 y.o. 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    She was admitted to [facility] in 7/14 with respiratory failure and RHC showed severe PAH with pulmonary pressures in the 90s. She was transferred to [facility] where she was started on IV Remodulin with excellent results.   She was admitted 8/14 for CP and tachycardia, was found to be in atypical flutter vs atrial tach. Amiodarone was considered but she converted spontaneously. She was started on Xarelto. Her discharge weight was 215 lbs.    Cath 2/16: Minimal CAD LAD 30% otherwise normal RHC on Remodulin RA = 8 RV = 45/5/8 PA = 52/24 (34) PCW = 11 Fick cardiac output/index = 7.6/3.6 PVR = 3.0 WU FA sat = 99% PA sat = 70%, 71%  Admitted 6/20 with respiratory failure and recurrent AFL in setting of PNA.   Admitted 4/21 with severe sepsis from RLE cellulitis. Also had marked volume overload. Bumped troponin and underwent R/L cath. Echo 4/21 EF 65-70% RV moderately down   1. 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 Normal LVEF 60-65% 3. RHC  Ao = 94/64 (78) LV = 86/11 RA =  6 RV = 69/4 PA = 63/17 (37) PCW = 7 Fick cardiac output/index = 6.0/2.8 PVR = 5.0 WU Ao sat = 96% PA sat = 66%, 68%  Here for routine f/u. Remains on remodulin. Has f/u with Dr. Gilles Chiquito tomorrow at [facility] (was unable to make last appointment).  Drinking a lot of fluids and eating popsicles. Says she had a hard time over the weekend when power went out and O2 not working. No problem with remodulin pump. Has cut torsemide back recently from 80 bid to 80 daily because she was waking up late and then peeing more at night. Says legs are swelling more lately. Skin more red. Continue with SOB with minimal exertion. Lives in a studio apartment so can't use her walker there but uses it everywhere else. Hard to get around. No orthopnea or PND. No CP or palpitations. No problems with PICC     Echo 4/21 LVEF 65-70% RV moderately dilated/HK Personally reviewed   Studies:   ECHO 08/11/12 EF 60-65% RV moderately dilated Peak PA pressure 48 mmhg  ECHO 02/28/13 EF 55-60% Grade I DD RV severely dilated and HK. Trivial TR. Septum flat Peak PA pressure 53 mmhg ECHO 11/15 EF 55-60% RV normal Trivial TR ECHO 1/16 at [facility] EF >55% RV mildly dilated (improved function) Trivial TR normal IVC ECHO 11/2014: EF 55-60%.  RV modrately dilated. Peak PA pressure 78 mm hg ECHO 03/2015: At [facility] RV severely dilated. EF >55% RVSP 110mHG [facility]   [facility] air sat was 96% sats fell briefly to 89 at the very end of the walk she went 341 m this is 82% predicted 6MWD 9/17 was 362  m  This is 88% predicted on 3 L oxygen saturations did not fall below 93%. 6MWD 3/18 363m 6MW 2020 2778m6MW 7/21 26372m [facility] L of oxygen. Resting room air sat is 87%. On the 4 L she is 98 it drops to 90 with exertion. She ambulated 263 m this is 68% predicted last time very similar at 27262and similar the prior visit as well.    Past Medical History:  Diagnosis Date   Acute on chronic diastolic (congestive) heart failure (HCCLawrence/22/2015   Acute on chronic respiratory failure (HCCBelt/11/2018   Anemia, iron deficiency    secondary to menhorrhagia, on oral iron, also b12 def, getting monthly b12 shots   Cellulitis 05/2019    RIGHT LOWER EXTREMITY   CHF (congestive heart failure) (HCC)    Chronic cough    secondary to alleriges and post nasal drip   Cognitive impairment    COPD (chronic obstructive pulmonary disease) (HCC)    Cor pulmonale (HCC)    PA Peak pressure 49m52m  Depression    Diabetes mellitus    well controlled on metformin   Diastolic heart failure (HCC)    GERD (gastroesophageal reflux disease)    H/O mental retardation    Herpes    Hyperlipidemia    Hypertension    OSA (obstructive sleep apnea)    CPAP   Pulmonary hypertension (HCC)[place] Renal disorder    Restrictive lung disease    PFTs 06/2012 (FVC 54% predicted and FEV1 68% predicted w minimal bronchodilator response).   Shortness of breath    Venous stasis ulcer (HCC)[place] chornic, ?followed up at wound care center, multiple courses of antibiotics in past for cellulitis, on lasix   Past Surgical History:  Procedure Laterality Date   CARDIAC CATHETERIZATION N/A 01/13/2016   Procedure: Right Heart Cath;  Surgeon: DaniJolaine Artist;  [facility];  Service: Cardiovascular;  Laterality: N/A;   CHOLECYSTECTOMY     COLONOSCOPY WITH PROPOFOL N/A 12/20/2019   Procedure: COLONOSCOPY WITH PROPOFOL;  Surgeon: JacoMilus Banister;  Location[facility];  Service: Endoscopy;  Laterality: N/A;   IR FLUORO GUIDE CV LINE LEFT  11/16/2019   IR FLUORO GUIDE CV LINE LEFT  06/17/2020   IR FLUORO GUIDE CV LINE LEFT  07/04/2020   IR FLUORO GUIDE CV LINE RIGHT  05/09/2019   IR FLUORO GUIDE CV LINE RIGHT  08/12/2019   IR REMOVAL TUN CV CATH W/O FL  05/05/2019   IR US G[place] VASC ACCESS LEFT  11/16/2019   IR US G[place]DE VASC ACCESS LEFT  06/17/2020   IR US G[place]DE VASC ACCESS LEFT  07/04/2020   IR US G[place]DE VASC ACCESS RIGHT  05/09/2019   IR US G[place]DE VASC ACCESS RIGHT  08/12/2019   IR US G[place]DE VASC ACCESS RIGHT  11/16/2019   IR VENO/JUGULAR RIGHT  11/16/2019   LEFT AND RIGHT HEART CATHETERIZATION WITH CORONARY ANGIOGRAM N/A 02/28/2014   Procedure:  LEFT AND RIGHT HEART CATHETERIZATION WITH CORONARY ANGIOGRAM;  Surgeon: DaniJolaine Artist;  [facility];  Service: Cardiovascular;  Laterality: N/A;   RIGHT HEART CATH N/A 06/27/2018   Procedure: RIGHT HEART CATH;  Surgeon: BensJolaine Artist;  [facility];  Service: Cardiovascular;  Laterality: N/A;   RIGHT/LEFT HEART CATH AND CORONARY ANGIOGRAPHY N/A 05/14/2019   Procedure: RIGHT/LEFT HEART CATH AND CORONARY ANGIOGRAPHY;  Surgeon: BensHaroldine Laws  [name], MD;  Location: [place] CV LAB;  Service: Cardiovascular;  Laterality: N/A;     Current Outpatient Medications  Medication Sig Dispense Refill   ACCU-CHEK AVIVA PLUS test strip USE 1 STRIP TO CHECK GLUCOSE ONCE DAILY 50 each 3   acetaminophen (TYLENOL) 500 MG tablet Take 1,000 mg by mouth every 6 (six) hours as needed.     benzonatate (TESSALON) 200 MG capsule Take 1 capsule (200 mg total) by mouth 3 (three) times daily as needed for cough. 60 capsule 1   cetirizine (EQ ALLERGY RELIEF, CETIRIZINE,) 10 MG tablet Take 1 tablet (10 mg total) by mouth daily. 90 tablet 1   cycloSPORINE (RESTASIS) 0.05 % ophthalmic emulsion Place 1 drop into both eyes 2 (two) times daily as needed (dry eyes).     diphenhydrAMINE (BENADRYL) 25 MG tablet Take 50-100 mg by mouth 2 (two) times daily as needed for itching.     fluticasone (FLONASE) 50 MCG/ACT nasal spray Place 1 spray into both nostrils daily as needed for allergies. 11.1 mL 1   gabapentin (NEURONTIN) 300 MG capsule Take 2 capsules (600 mg total) by mouth 3 (three) times daily. 180 capsule 2   metFORMIN (GLUCOPHAGE) 500 MG tablet TAKE 1/2 (ONE-HALF) TABLET BY MOUTH WITH BREAKFAST 45 tablet 0   OXYGEN Inhale 4 L into the lungs continuous.     pantoprazole (PROTONIX) 20 MG tablet Take 1 tablet (20 mg total) by mouth 2 (two) times daily. 180 tablet 0   potassium chloride SA (KLOR-CON M20) 20 MEQ tablet Take 2 tablets (40 mEq total) by mouth 2 (two) times daily. 180 tablet  0   pravastatin (PRAVACHOL) 20 MG tablet Take 1 tablet by mouth once daily 90 tablet 0   rivaroxaban (XARELTO) 20 MG TABS tablet Take 1 tablet (20 mg total) by mouth daily with supper. 90 tablet 2   sildenafil (REVATIO) 20 MG tablet TAKE 1 TABLET THREE TIMES A DAY 90 tablet 11   spironolactone (ALDACTONE) 25 MG tablet Take 1/2 (one-half) tablet by mouth once daily 45 tablet 0   torsemide (DEMADEX) 20 MG tablet Take 20 mg by mouth daily. 4 tablets     treprostinil (REMODULIN) 5 MG/ML SOLN injection See admin instructions. As of 08/09/2019: Add 7 ml of Remodulin to cassette and 93 ml of sterile diluent for Remodulin to cassetet for a total volume of 100 ml to make a concentration of 350,000 ng per ml.  Infuse via a CADD pump intravenously at a rate of 40 ml per 24 hours. Based on a dosing weight of 120 kg, the dose is 81 ng per kg per min.  Once opened, discard Remodulin vial after 30 days.  Sterile Diluent for Remodulin vials are single use only. Once mixed, discard cassette after 48 hours.     vitamin B-12 (CYANOCOBALAMIN) 500 MCG tablet Take 500 mcg by mouth daily.     No current facility-administered medications for this encounter.    Allergies:   Aspirin, Codeine, Lisinopril, Sulfonamide derivatives, and Latex   Social History:  The patient  reports that she has never smoked. She has never used smokeless tobacco. She reports that she does not drink alcohol and does not use drugs.   Family History:  The patient's family history includes Bipolar disorder in her sister; Breast cancer in her maternal aunt; Hyperlipidemia in her mother; Kidney Stones in her son; Mental illness in her sister; Mental retardation in her brother.   ROS:  Please see the history of  present illness.   All other systems are personally reviewed and negative.   Vitals:   10/06/20 1104  BP: 102/60  Pulse: 87  SpO2: 94%  Weight: 129.2 kg (284 lb 12.8 oz)   Wt Readings from Last 3 Encounters:  10/06/20 129.2 kg (284 lb  12.8 oz)  09/02/20 126.2 kg (278 lb 4.8 oz)  08/31/20 124 kg (273 lb 5.9 oz)     Exam:  General:  Obese woman on O2 no distress HEENT: normal Neck: supple. Unable to see JVP Carotids 2+ bilat; no bruits. No lymphadenopathy or thryomegaly appreciated. Cor: PMI nondisplaced. Regular rate & rhythm. 2/6 TR PICC site ok  Lungs: clear Abdomen: obese soft, nontender, nondistended. No hepatosplenomegaly. No bruits or masses. Good bowel sounds. Extremities: no cyanosis, clubbing, rash, 2+ woody edema with mild erythema/stasis dermatitis small weeping site on LLE Neuro: alert & orientedx3, cranial nerves grossly intact. moves all 4 extremities w/o difficulty. Affect pleasant  ECG: NSR 84 1AVB  RAD Personally reviewed    Recent Labs: 08/31/2020: B Natriuretic Peptide 32.3; BUN 11; Creatinine, Ser 0.67; Hemoglobin 11.3; Platelets 553; Potassium 3.8; Sodium 135  Personally reviewed   Wt Readings from Last 3 Encounters:  10/06/20 129.2 kg (284 lb 12.8 oz)  09/02/20 126.2 kg (278 lb 4.8 oz)  08/31/20 124 kg (273 lb 5.9 oz)      ASSESSMENT AND PLAN:  1) Chronic diastolic HF/cor pulmonale:  - NYHA III-IIIB symptoms chronically. Volume status appears moderately elevated - Increase torsemide back to 80 mg bid - Continue spiro 12.5 mg daily - Poor candidate for SGLT2i with hygiene issues - Reinforced fluid restriction - Labs today   2) Chronic venous statis dermatitis with overlying cellulitis - this has caused severe sepsis in past - she has increased erythema today with small weeping area on LLE - will place UNNA boots. Start doxycycline - increase diuretics as above  3) PAH, severe with cor pulmonale.  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 However PAH far out of proportion to L-sided pressures or restrictive lung disease. - Follows with [facility] stable on Remodulin + sildenafil.   - Developed pulmonary edema with ERA in past so have not rechallenged - Recent  6MW stable - Continue current regimen. Had f/u with [facility]  4) Paroxysmal atrial flutter.  - Remains in NSR. Continue Xarelto. No bleeding  5) Chronic hypoxic respiratory failure - continue O2   Signed, Glori Bickers, MD  10/06/2020 11:34 AM  Advanced Heart Failure [place] [id number] [phone] (office) [phone] (fax)

## 2020-10-07 DIAGNOSIS — R0609 Other forms of dyspnea: Secondary | ICD-10-CM | POA: Diagnosis not present

## 2020-10-07 DIAGNOSIS — Z79899 Other long term (current) drug therapy: Secondary | ICD-10-CM | POA: Diagnosis not present

## 2020-10-07 DIAGNOSIS — I272 Pulmonary hypertension, unspecified: Secondary | ICD-10-CM | POA: Diagnosis not present

## 2020-10-07 DIAGNOSIS — J984 Other disorders of lung: Secondary | ICD-10-CM | POA: Diagnosis not present

## 2020-10-07 DIAGNOSIS — I5030 Unspecified diastolic (congestive) heart failure: Secondary | ICD-10-CM | POA: Diagnosis not present

## 2020-10-07 DIAGNOSIS — L03115 Cellulitis of right lower limb: Secondary | ICD-10-CM | POA: Diagnosis not present

## 2020-10-07 DIAGNOSIS — L03116 Cellulitis of left lower limb: Secondary | ICD-10-CM | POA: Diagnosis not present

## 2020-10-07 DIAGNOSIS — Z23 Encounter for immunization: Secondary | ICD-10-CM | POA: Diagnosis not present

## 2020-10-07 DIAGNOSIS — J9611 Chronic respiratory failure with hypoxia: Secondary | ICD-10-CM | POA: Diagnosis not present

## 2020-10-12 ENCOUNTER — Other Ambulatory Visit: Payer: Self-pay | Admitting: Internal Medicine

## 2020-10-12 DIAGNOSIS — I5033 Acute on chronic diastolic (congestive) heart failure: Secondary | ICD-10-CM

## 2020-10-22 ENCOUNTER — Ambulatory Visit: Payer: Medicaid Other | Admitting: Behavioral Health

## 2020-10-22 DIAGNOSIS — F419 Anxiety disorder, unspecified: Secondary | ICD-10-CM

## 2020-10-22 DIAGNOSIS — F331 Major depressive disorder, recurrent, moderate: Secondary | ICD-10-CM

## 2020-10-22 NOTE — BH Specialist Note (Signed)
Integrated Behavioral Health via Telemedicine Visit  10/22/2020 AYLISSA HEINEMANN [id number]  Number of Agency visits: 1/6 Session Start time: 2:00pm  Session End time: 2:30pm Total time: 30  Referring Provider: Dr. Jake Shark, DO Patient/Family location: Pt is home in private [facility] Provider location: [facility] Office All persons participating in visit: Pt & Clinician Types of Service: Introduction only  I connected with [name] and/or [place] A Bega's  self  via  Telephone or Geologist, engineering  (Video is Tree surgeon) and verified that I am speaking with the correct person using two identifiers. Discussed confidentiality: Yes   I discussed the limitations of telemedicine and the availability of in person appointments.  Discussed there is a possibility of technology failure and discussed alternative modes of communication if that failure occurs.  I discussed that engaging in this telemedicine visit, they consent to the provision of behavioral healthcare and the services will be billed under their insurance.  Patient and/or legal guardian expressed understanding and consented to Telemedicine visit: Yes   Presenting Concerns: Patient and/or family reports the following symptoms/concerns: elevated anx/dep due to her health status issues w/cardiac & lung concerns Duration of problem: years since she was last in psychotherapy; Severity of problem: moderate  Patient and/or Family's Strengths/Protective Factors: Concrete supports in place (healthy food, safe environments, etc.) and Sense of purpose  Goals Addressed: Patient will:  Reduce symptoms of: anxiety, depression, and stress   Increase knowledge and/or ability of: coping skills, healthy habits, and stress reduction   Demonstrate ability to: Increase healthy adjustment to current life circumstances  Progress towards Goals: Estb 'd today; Pt agrees to psychotherapy sessions for  her stress  Interventions: Interventions utilized:  Supportive Counseling Standardized Assessments completed: screeners prn  Patient and/or Family Response: Pt receptive to call while learning her new phone & interrupting her game.  Assessment: Patient currently experiencing elevated anx/dep & concerns for her Son & his stress.   Patient may benefit from cont'd support for her health & Family issues along w/her past.  Plan: Follow up with behavioral health clinician on : 2-3 wks for 60 min on telehealth Behavioral recommendations: Keep a notebook btwn sessions to focus our work in the visits. Referral(s): [place] (In Clinic)  I discussed the assessment and treatment plan with the patient and/or parent/guardian. They were provided an opportunity to ask questions and all were answered. They agreed with the plan and demonstrated an understanding of the instructions.   They were advised to call back or seek an in-person evaluation if the symptoms worsen or if the condition fails to improve as anticipated.  Donnetta Hutching, LMFT

## 2020-10-23 ENCOUNTER — Ambulatory Visit: Payer: Self-pay

## 2020-10-24 ENCOUNTER — Other Ambulatory Visit: Payer: Self-pay

## 2020-10-24 ENCOUNTER — Other Ambulatory Visit: Payer: Self-pay | Admitting: *Deleted

## 2020-10-24 DIAGNOSIS — Z9189 Other specified personal risk factors, not elsewhere classified: Secondary | ICD-10-CM

## 2020-10-24 NOTE — Patient Instructions (Signed)
Visit Information  [name] was given information about Medicaid Managed Care team care coordination services as a part of their Healthy [facility] Medicaid benefit. [name] verbally consented to engagement with the [facility] Managed Care team.   If you are experiencing a medical emergency, please call 911 or report to your local emergency department or urgent care.   If you have a non-emergency medical problem during routine business hours, please contact your provider's office and ask to speak with a nurse.   For questions related to your Healthy [facility] health plan, please call: [phone] or visit the homepage here: [email or web address]  If you would like to schedule transportation through your Healthy [facility] plan, please call the following number at least 2 days in advance of your appointment: [phone]  Call the [place] at [phone], at any time, 24 hours a day, 7 days a week. If you are in danger or need immediate medical attention call 911.  If you would like help to quit smoking, call 1-800-QUIT-NOW 205-481-0382) OR Espaol: 1-855-Djelo-Ya (9-242-683-4196) o para Faith informacin haga clic aqu or Text READY to 200-400 to register via text  [name] - following are the goals we discussed in your visit today:   Goals Addressed             This Visit's Progress    Track and Manage Fluids and Swelling-Heart Failure       Timeframe:  Long-Range Goal Priority:  High Start Date:   12/11/19                          Expected End Date: 11/07/20                    Follow Up Date 11/07/20   - call office if I gain more than 2 pounds in one day or 5 pounds in one week - keep legs up while sitting - track weight in diary - use salt in moderation - watch for swelling in feet, ankles and legs every day - weigh myself daily    Why is this important?   It is important to check your weight daily and watch  how much salt and liquids you have.  It will help you to manage your heart failure.         Track and Manage Symptoms-Heart Failure       Timeframe:  Long-Range Goal Priority:  High Start Date:   12/11/19                          Expected End Date:   11/07/20                Follow Up Date 11/08/19   - begin a heart failure diary - eat more whole grains, fruits and vegetables, lean meats and healthy fats - know when to call the doctor - track symptoms and what helps feel better or worse  - increase activity, doing light exercises 2-3 times a week   Why is this important?   You will be able to handle your symptoms better if you keep track of them.  Making some simple changes to your lifestyle will help.  Eating healthy is one thing you can do to take good care of yourself.            Please see education materials related to CHF provided as print  materials.   The patient verbalized understanding of instructions provided today and agreed to receive a mailed copy of patient instruction and/or educational materials.  Telephone follow up appointment with Managed Medicaid care management team member scheduled for:11/07/20 @ 2:30pm  [name] RN, BSN [place] RN Care Coordinator   Following is a copy of your plan of care:  Care Plan : Heart Failure (Adult)  Updates made by Melissa Montane, RN since 10/24/2020 12:00 AM     Problem: Symtom Management      Long-Range Goal: Symptom Exacerbation Prevented or Minimized   Start Date: 12/11/2019  Expected End Date: 11/07/2020  Recent Progress: On track  Priority: High  Note:   Current Barriers:  Chronic Disease Management support and education needs related management of multiple chronic diagnosis; CHF, COPD, OSA and T2DM. Today [name] reports having episodes of dizziness. This is a new symptom for her. She has not notified her provider. Provider notified, labs collected. Hgb down to 10.8 from  13.2. Patient reports that she has not been taking iron(d/c by physician) and she has not been eating a healthy diet. Dizziness has improved with resting and healthy diet. Discussed recent move with [name]. She recently moved into a studio apartment. She is no longer living with her son, he does continue to pick up her medications. She has a bus stop 1.5 blocks away and knows that she can use medical transportation for appointments. She is adjusting to living on her own.-Update-Patient living in a studio apartment, on the same property as her son-he lives in the big house. She is interested in [facility]. She did not attend PT for lymphedema and would like a new referral placed.  Lacks social connections Does not contact provider office for questions/concerns Nurse Case Manager Clinical Goal(s):  patient will verbalize understanding of plan for management of new symptoms patient will attend all scheduled medical appointments: Pulmonology 02/26/20 and PCP 04/30/20 patient will demonstrate improved health management independence as evidenced by reporting new symptoms or concerns to provider-Met Interventions:  Inter-disciplinary care team collaboration (see longitudinal plan of care) Discussed plans with patient for ongoing care management follow up and provided patient with direct contact information for care management team Care Guide referral for assistance with social connections(local churches and exercise programs) Reviewed upcoming appointments: [facility] 10/24 and Eye Exam 10/27 Therapeutic listening Collaborate with PCP for new referral to PT for lymphedema and patient requesting PCS Encouraged patient to answer calls, as they may be for scheduling PT Reviewed medications Advised patient to have her son assist with clearing her voicemail to allow providers to leave a message if she is unable to answer her phone Patient Goals/Self-Care Activities - begin a heart failure diary - eat more  whole grains, fruits and vegetables, lean meats and healthy fats - know when to call the doctor - track symptoms and what helps feel better or worse  -Self administers medications as prescribed  -Calls pharmacy for medication refills - Calls provider office for new concerns or questions - Incorporate more iron rich foods into your diet - increase activity, doing light exercises 2-3 times a week  Follow Up Plan: Telephone follow up appointment with Managed Medicaid care management team member scheduled for:

## 2020-10-24 NOTE — Patient Outreach (Signed)
Medicaid Managed Care   Nurse Care Manager Note  10/24/2020 Name:  [name] MRN:  [id number] DOB:  07/04/1960  [name] is an 60 y.o. year old female who is a primary patient of Masters, [name], DO.  The [facility] Managed Care Coordination team was consulted for assistance with:    CHF COPD  [name] was given information about Medicaid Managed Care Coordination team services today. [name] Patient agreed to services and verbal consent obtained.  Engaged with patient by telephone for follow up visit in response to provider referral for case management and/or care coordination services.   Assessments/Interventions:  Review of past medical history, allergies, medications, health status, including review of consultants reports, laboratory and other test data, was performed as part of comprehensive evaluation and provision of chronic care management services.  SDOH (Social Determinants of Health) assessments and interventions performed: SDOH Interventions    Flowsheet Row Most Recent Value  SDOH Interventions   Housing Interventions Intervention Not Indicated  Social Connections Interventions Other (Comment)  [Care Guide referral for social connections-interested in exercise groups and local churches]       Care Plan  Allergies  Allergen Reactions   Aspirin Swelling    REACTION: airway swelling   Codeine Other (See Comments)    REACTION: tingling in lips and hard breathing - had reaction at dentist - states "I can't take certain kinds of codeine" - happened maybe 10 yr ago   Lisinopril Cough   Sulfonamide Derivatives Swelling    REACTION: airway swelling   Latex Rash    Medications Reviewed Today     Reviewed by Melissa Montane, RN (Registered Nurse) on 10/24/20 at 1109  Med List Status: <None>   Medication Order Taking? Sig Documenting Provider Last Dose Status Informant  ACCU-CHEK AVIVA PLUS test strip [id number] No USE 1 STRIP TO CHECK GLUCOSE ONCE  DAILY  Patient not taking: Reported on 10/24/2020   [name], MD Not Taking Active Self  acetaminophen (TYLENOL) 500 MG tablet [id number] Yes Take 1,000 mg by mouth every 6 (six) hours as needed. [provider] Taking Active   benzonatate (TESSALON) 200 MG capsule [id number] Yes Take 1 capsule (200 mg total) by mouth 3 (three) times daily as needed for cough. Laurin Coder, MD Taking Active Self  cetirizine (EQ ALLERGY RELIEF, CETIRIZINE,) 10 MG tablet [id number] Yes Take 1 tablet (10 mg total) by mouth daily. [name], MD Taking Active Self  cycloSPORINE (RESTASIS) 0.05 % ophthalmic emulsion [id number] No Place 1 drop into both eyes 2 (two) times daily as needed (dry eyes).  Patient not taking: Reported on 10/24/2020   [provider] Not Taking Active Self  diphenhydrAMINE (BENADRYL) 25 MG tablet [id number] Yes Take 50-100 mg by mouth 2 (two) times daily as needed for itching. [provider] Taking Active Self  fluticasone (FLONASE) 50 MCG/ACT nasal spray [id number] Yes Place 1 spray into both nostrils daily as needed for allergies. [name], MD Taking Active Self  gabapentin (NEURONTIN) 300 MG capsule [id number] Yes Take 2 capsules (600 mg total) by mouth 3 (three) times daily. Marianna Payment, MD Taking Active   metFORMIN (GLUCOPHAGE) 500 MG tablet [id number] Yes TAKE 1/2 (ONE-HALF) TABLET BY MOUTH WITH [name], MD Taking Active   OXYGEN [id number] Yes Inhale 4 L into the lungs continuous. [provider] Taking Active Self  pantoprazole (PROTONIX) 20 MG tablet [id number] Yes Take 1 tablet (20 mg total) by mouth  2 (two) times daily. [name], MD Taking Active Self  potassium chloride SA (KLOR-CON) 20 MEQ tablet [id number] Yes Take 2 tablets by mouth twice daily Sanjuan Dame, MD Taking Active   pravastatin (PRAVACHOL) 20 MG tablet [id number] Yes Take 1 tablet by mouth once daily [name], MD Taking  Active Self  rivaroxaban (XARELTO) 20 MG TABS tablet [id number] Yes Take 1 tablet (20 mg total) by mouth daily with supper. Sanjuan Dame, MD Taking Active   sildenafil (REVATIO) 20 MG tablet [id number] Yes TAKE 1 TABLET THREE TIMES A DAY Bensimhon, [name], MD Taking Active   spironolactone (ALDACTONE) 25 MG tablet [id number] Yes Take 1/2 (one-half) tablet by mouth once daily Sanjuan Dame, MD Taking Active   torsemide (DEMADEX) 20 MG tablet [id number] Yes Take 4 tablets (80 mg total) by mouth 2 (two) times daily. Bensimhon, [name], MD Taking Active   treprostinil (REMODULIN) 5 MG/ML SOLN injection [id number] Yes See admin instructions. As of 08/09/2019: Add 7 ml of Remodulin to cassette and 93 ml of sterile diluent for Remodulin to cassetet for a total volume of 100 ml to make a concentration of 350,000 ng per ml.  Infuse via a CADD pump intravenously at a rate of 40 ml per 24 hours. Based on a dosing weight of 120 kg, the dose is 81 ng per kg per min.  Once opened, discard Remodulin vial after 30 days.  Sterile Diluent for Remodulin vials are single use only. Once mixed, discard cassette after 48 hours. [provider] Taking Active Self           Med Note Ralene Cork Dec 17, 2019 12:50 PM) Continuous infusion 12/17/19  vitamin B-12 (CYANOCOBALAMIN) 500 MCG tablet [id number] Yes Take 500 mcg by mouth daily. 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[place])    Right ovarian cyst 05/19/2017   Peripheral neuropathy ([place]) 09/27/2014   Healthcare maintenance 09/27/2014   Atrial flutter ([place]) 08/17/2012   Pulmonary hypertension ([place]) 08/16/2012   Right heart failure, NYHA class 3 ([place]) 03/15/2012   Allergic rhinitis 11/16/2011   Diabetes ([place]) 10/15/2010   Hyperlipidemia 10/15/2010   Obesity 10/14/2010   Obstructive sleep apnea 02/21/2009   GERD 11/29/2005    Conditions to be addressed/monitored per PCP order:  CHF and COPD  Care Plan : Heart Failure (Adult)  Updates made by Melissa Montane, RN since 10/24/2020 12:00 AM     Problem: Symtom Management      Long-Range Goal: Symptom Exacerbation Prevented or Minimized   Start Date: 12/11/2019  Expected End Date: 11/07/2020  Recent Progress: On track  Priority: High  Note:   Current Barriers:  Chronic Disease Management support and education needs related management of multiple chronic diagnosis; CHF, COPD, OSA and T2DM. Today Ms Bodkins reports having episodes of dizziness. This is a new  symptom for her. She has not notified her provider. Provider notified, labs collected. Hgb down to 10.8 from 13.2. Patient reports that she has not been taking iron(d/c by physician) and she has not been eating a healthy diet. Dizziness has improved with resting and healthy diet. Discussed recent move with Ms. Sylvia. She recently moved into a studio apartment. She is no longer living with her son, he does continue to pick up her medications. She has a bus stop 1.5 blocks away and knows that she can use medical transportation for appointments. She is adjusting to living on her own.-Update-Patient living in a studio apartment, on the same property as her son-he lives in the big house. She is interested in [facility]. She did not attend PT for lymphedema and would like a new referral placed.  Lacks social  connections Does not contact provider office for questions/concerns Nurse Case Manager Clinical Goal(s):  patient will verbalize understanding of plan for management of new symptoms patient will attend all scheduled medical appointments: Pulmonology 02/26/20 and PCP 04/30/20 patient will demonstrate improved health management independence as evidenced by reporting new symptoms or concerns to provider-Met Interventions:  Inter-disciplinary care team collaboration (see longitudinal plan of care) Discussed plans with patient for ongoing care management follow up and provided patient with direct contact information for care management team Care Guide referral for assistance with social connections(local churches and exercise programs) Reviewed upcoming appointments: [facility] 10/24 and Eye Exam 10/27 Therapeutic listening Collaborate with PCP for new referral to PT for lymphedema and patient requesting PCS Encouraged patient to answer calls, as they may be for scheduling PT Reviewed medications Advised patient to have her son assist with clearing her voicemail to allow providers to leave a message if she is unable to answer her phone Patient Goals/Self-Care Activities - begin a heart failure diary - eat more whole grains, fruits and vegetables, lean meats and healthy fats - know when to call the doctor - track symptoms and what helps feel better or worse  -Self administers medications as prescribed  -Calls pharmacy for medication refills - Calls provider office for new concerns or questions - Incorporate more iron rich foods into your diet - increase activity, doing light exercises 2-3 times a week  Follow Up Plan: Telephone follow up appointment with Managed Medicaid care management team member scheduled for:      Follow Up:  Patient agrees to Care Plan and Follow-up.  Plan: The Managed Medicaid care management team will reach out to the patient again over the next 14  days.  Date/time of next scheduled RN care management/care coordination outreach:  11/07/20@ 2:30p  [name] RN, BSN [place] RN Care Coordinator

## 2020-10-27 ENCOUNTER — Ambulatory Visit: Payer: Medicaid Other | Admitting: Podiatry

## 2020-10-27 ENCOUNTER — Other Ambulatory Visit: Payer: Self-pay

## 2020-10-27 ENCOUNTER — Encounter: Payer: Self-pay | Admitting: Podiatry

## 2020-10-27 DIAGNOSIS — E1142 Type 2 diabetes mellitus with diabetic polyneuropathy: Secondary | ICD-10-CM

## 2020-10-27 DIAGNOSIS — M7989 Other specified soft tissue disorders: Secondary | ICD-10-CM

## 2020-10-27 NOTE — Progress Notes (Signed)
Subjective:  [name], female    DOB: 10-Dec-1960,   MRN: [id number]  Chief Complaint  Patient presents with   Diabetes    Patient endorses numbness and tingling in the feet but denies burning. Nail trim needed. Has some swelling in the feet and ankles     60 y.o. female presents for diabetic foot check and exam. Patient relates doing well nails are thickened and difficult to cute. States she has swelling in ankles and feet that she as been working on with PCP. Has follow-up with specialist to figure out cause of swelling. Relates occasional tingling and burning in the feet. Last A1c 5.6  . Denies any other pedal complaints. Denies n/v/f/c.   PCP: Christiana Fuchs DO   Past Medical History:  Diagnosis Date   Acute on chronic diastolic (congestive) heart failure ([place]) 06/25/2013   Acute on chronic respiratory failure ([place]) 06/15/2018   Anemia, iron deficiency    secondary to menhorrhagia, on oral iron, also b12 def, getting monthly b12 shots   Cellulitis 05/2019   RIGHT LOWER EXTREMITY   CHF (congestive heart failure) (HCC)    Chronic cough    secondary to alleriges and post nasal drip   Cognitive impairment    COPD (chronic obstructive pulmonary disease) (HCC)    Cor pulmonale (HCC)    PA Peak pressure 57mHg   Depression    Diabetes mellitus    well controlled on metformin   Diastolic heart failure (HCC)    GERD (gastroesophageal reflux disease)    H/O mental retardation    Herpes    Hyperlipidemia    Hypertension    OSA (obstructive sleep apnea)    CPAP   Pulmonary hypertension ([place]    Renal disorder    Restrictive lung disease    PFTs 06/2012 (FVC 54% predicted and FEV1 68% predicted w minimal bronchodilator response).   Shortness of breath    Venous stasis ulcer (HCC)    chornic, ?followed up at wound care center, multiple courses of antibiotics in past for cellulitis, on lasix    Objective:  Physical Exam: Vascular: DP/PT pulses 2/4 bilateral. CFT  <3 seconds. Normal hair growth on digits. Pitting edema noted to bilatearl extremities with erythema noted around legs due to hemosiderin.   Skin. No lacerations or abrasions bilateral feet. Nails 1-5 are thickened discolored and elongated with subungual debris.  Musculoskeletal: MMT 5/5 bilateral lower extremities in DF, PF, Inversion and Eversion. Deceased ROM in DF of ankle joint.  Neurological: Sensation intact to light touch.   Assessment:   1. Type 2 diabetes mellitus with diabetic polyneuropathy, without long-term current use of insulin ([place]   2. Swelling of lower extremity      Plan:  Patient was evaluated and treated and all questions answered. -Discussed and educated patient on diabetic foot care, especially with  regards to the vascular, neurological and musculoskeletal systems.  -Stressed the importance of good glycemic control and the detriment of not  controlling glucose levels in relation to the foot. -Discussed supportive shoes at all times and checking feet regularly.  -Mechanically debrided all nails 1-5 bilateral using sterile nail nipper and filed with dremel without incident as courtesy  -Answered all patient questions -To follow-up with vein specialist for swelling in legs. Discussed compression and patient states she does not do well with compression.  -Patient to return  in one year for diabetic foot check.  -Patient advised to call the office if any problems or questions  arise in the meantime.   Lorenda Peck, DPM

## 2020-10-30 DIAGNOSIS — N3946 Mixed incontinence: Secondary | ICD-10-CM | POA: Diagnosis not present

## 2020-10-30 DIAGNOSIS — E119 Type 2 diabetes mellitus without complications: Secondary | ICD-10-CM | POA: Diagnosis not present

## 2020-11-03 ENCOUNTER — Telehealth: Payer: Self-pay

## 2020-11-03 NOTE — Telephone Encounter (Signed)
   Telephone encounter was:  Unsuccessful.  11/03/2020 Name: [name] MRN: [id number] DOB: Oct 13, 1960  Unsuccessful outbound call made today to assist with:  Unable to leave message voicemail is full.  Outreach Attempt:  1st Attempt  [name], AAS Paralegal, [place] [facility]  [address], [place] [address] ??[email or web address]  ?? [phone]   [email or web address]

## 2020-11-04 DIAGNOSIS — I5030 Unspecified diastolic (congestive) heart failure: Secondary | ICD-10-CM | POA: Diagnosis not present

## 2020-11-04 DIAGNOSIS — J984 Other disorders of lung: Secondary | ICD-10-CM | POA: Diagnosis not present

## 2020-11-04 DIAGNOSIS — G4730 Sleep apnea, unspecified: Secondary | ICD-10-CM | POA: Diagnosis not present

## 2020-11-04 DIAGNOSIS — I270 Primary pulmonary hypertension: Secondary | ICD-10-CM | POA: Diagnosis not present

## 2020-11-06 ENCOUNTER — Other Ambulatory Visit: Payer: Self-pay | Admitting: Internal Medicine

## 2020-11-06 DIAGNOSIS — I890 Lymphedema, not elsewhere classified: Secondary | ICD-10-CM

## 2020-11-07 ENCOUNTER — Other Ambulatory Visit: Payer: Self-pay | Admitting: *Deleted

## 2020-11-07 NOTE — Patient Outreach (Signed)
Care Coordination  11/07/2020  SOLACE MANWARREN January 18, 1960 [id number]   Medicaid Managed Care   Unsuccessful Outreach Note  11/07/2020 Name: [name] MRN: [id number] DOB: [birth year]  Referred by: Masters, [name], DO Reason for referral : High Risk Managed Medicaid (Unsuccessful RNCM follow up outreach)   An unsuccessful telephone outreach was attempted today. The patient was referred to the case management team for assistance with care management and care coordination.   Follow Up Plan: The care management team will reach out to the patient again over the next 14 days.   [name] RN, BSN [place]  [employer] manager

## 2020-11-07 NOTE — Patient Instructions (Signed)
Visit Information  [name]  - as a part of your Medicaid benefit, you are eligible for care management and care coordination services at no cost or copay. I was unable to reach you by phone today but would be happy to help you with your health related needs. Please feel free to call me @ [phone].   A member of the Managed Medicaid care management team will reach out to you again over the next 14 days.   [name] RN, BSN [place]  [employer] manager

## 2020-11-12 ENCOUNTER — Ambulatory Visit: Payer: Medicaid Other | Admitting: Behavioral Health

## 2020-11-12 DIAGNOSIS — F419 Anxiety disorder, unspecified: Secondary | ICD-10-CM

## 2020-11-12 DIAGNOSIS — F331 Major depressive disorder, recurrent, moderate: Secondary | ICD-10-CM

## 2020-11-12 NOTE — BH Specialist Note (Signed)
Integrated Behavioral Health via Telemedicine Visit  11/12/2020 KASHINA MECUM [id number]  Number of [place] visits: 2/6 Session Start time: 11:00am  Session End time: 11:45am Total time: 63   Referring Provider: Dr. Jake Shark, DO Patient/Family location: Pt is in her new Studio Apt in private [facility] Provider location: [facility] Office All persons participating in visit: Pt & Clinician Types of Service: Individual psychotherapy  I connected with [name] and/or Josette A Totherow's  self  via  Telephone or Geologist, engineering  (Video is Tree surgeon) and verified that I am speaking with the correct person using two identifiers. Discussed confidentiality: Yes   I discussed the limitations of telemedicine and the availability of in person appointments.  Discussed there is a possibility of technology failure and discussed alternative modes of communication if that failure occurs.  I discussed that engaging in this telemedicine visit, they consent to the provision of behavioral healthcare and the services will be billed under their insurance.  Patient and/or legal guardian expressed understanding and consented to Telemedicine visit: Yes   Presenting Concerns: Patient and/or family reports the following symptoms/concerns: Health status changes that concern her & adjustment to her new residence Duration of problem: a few wks of adjustment thus far to being sep'd from her Son; Severity of problem: moderate  Patient and/or Family's Strengths/Protective Factors: Social connections and Concrete supports in place (healthy food, safe environments, etc.)  Goals Addressed: Patient will:  Reduce symptoms of: anxiety, depression, and stress due to recent relocation which reduces how often she sees her Son; she is missing him  Increase knowledge and/or ability of: healthy habits and stress reduction   Demonstrate ability to: Increase healthy  adjustment to current life circumstances  Progress towards Goals: Ongoing  Interventions: Interventions utilized:  Motivational Interviewing Standardized Assessments completed:  screeners prn  Patient and/or Family Response: Pt is receptive to call today & requests future appt  Assessment: Patient currently experiencing adjustment issues to her move into independent living in a studio Apt that is attached to the home where her Son lives w/his [employer]  Pt is adjusting to not seeing Son as often. Pt is concerned for the multiple changes in the World that have impacted daily living. This has also invld her Son's work.  Pt reports she refuses to be placed on a Ventilator if her health situation turns dire. She sts both her Cardiologist & Pulmonologist know this AD. Pt has requested alternative means to assist her breathing. Pt reports a CPAP machine would be used if necessary.  Patient may benefit from cont'd ck-in appt for mental health wellness & adjustment to her new circumstances.  Plan: Follow up with behavioral health clinician on : 2-3 wks on telehealth for 30 min Behavioral recommendations: Cont to keep your Notebook Referral(s): [place] (In Clinic)  I discussed the assessment and treatment plan with the patient and/or parent/guardian. They were provided an opportunity to ask questions and all were answered. They agreed with the plan and demonstrated an understanding of the instructions.   They were advised to call back or seek an in-person evaluation if the symptoms worsen or if the condition fails to improve as anticipated.  Donnetta Hutching, LMFT

## 2020-11-13 ENCOUNTER — Other Ambulatory Visit: Payer: Self-pay

## 2020-11-13 ENCOUNTER — Other Ambulatory Visit: Payer: Self-pay | Admitting: *Deleted

## 2020-11-13 NOTE — Patient Outreach (Signed)
Medicaid Managed Care   Nurse Care Manager Note  11/13/2020 Name:  [name] MRN:  [id number] DOB:  Oct 23, 1960  [name] is an 60 y.o. year old female who is a primary patient of Masters, [name], DO.  The [facility] Managed Care Coordination team was consulted for assistance with:    CHF COPD Pulmonary Disease  [name] was given information about Medicaid Managed Care Coordination team services today. [name] Patient agreed to services and verbal consent obtained.  Engaged with patient by telephone for follow up visit in response to provider referral for case management and/or care coordination services.   Assessments/Interventions:  Review of past medical history, allergies, medications, health status, including review of consultants reports, laboratory and other test data, was performed as part of comprehensive evaluation and provision of chronic care management services.  SDOH (Social Determinants of Health) assessments and interventions performed: SDOH Interventions    Flowsheet Row Most Recent Value  SDOH Interventions   Food Insecurity Interventions Intervention Not Indicated       Care Plan  Allergies  Allergen Reactions   Aspirin Swelling    REACTION: airway swelling   Codeine Other (See Comments)    REACTION: tingling in lips and hard breathing - had reaction at dentist - states "I can't take certain kinds of codeine" - happened maybe 10 yr ago   Lisinopril Cough   Sulfonamide Derivatives Swelling    REACTION: airway swelling   Latex Rash    Medications Reviewed Today     Reviewed by Melissa Montane, RN (Registered Nurse) on 11/13/20 at 1433  Med List Status: <None>   Medication Order Taking? Sig Documenting Provider Last Dose Status Informant  ACCU-CHEK AVIVA PLUS test strip [id number]  USE 1 STRIP TO CHECK GLUCOSE ONCE DAILY  Patient not taking: Reported on 10/24/2020   [name], MD  Active Self  acetaminophen (TYLENOL) 500 MG  tablet [id number] Yes Take 1,000 mg by mouth every 6 (six) hours as needed. [provider] Taking Active   benzonatate (TESSALON) 200 MG capsule [id number] Yes Take 1 capsule (200 mg total) by mouth 3 (three) times daily as needed for cough. Laurin Coder, MD Taking Active Self  cetirizine (EQ ALLERGY RELIEF, CETIRIZINE,) 10 MG tablet [id number] Yes Take 1 tablet (10 mg total) by mouth daily. [name], MD Taking Active Self  cycloSPORINE (RESTASIS) 0.05 % ophthalmic emulsion [id number] No Place 1 drop into both eyes 2 (two) times daily as needed (dry eyes).  Patient not taking: No sig reported   [provider] Not Taking Active   diphenhydrAMINE (BENADRYL) 25 MG tablet [id number] Yes Take 50-100 mg by mouth 2 (two) times daily as needed for itching. [provider] Taking Active Self  fluticasone (FLONASE) 50 MCG/ACT nasal spray [id number] Yes Place 1 spray into both nostrils daily as needed for allergies. [name], MD Taking Active Self  gabapentin (NEURONTIN) 300 MG capsule [id number]  Take 2 capsules (600 mg total) by mouth 3 (three) times daily. Marianna Payment, MD  Expired 11/06/20 2359   metFORMIN (GLUCOPHAGE) 500 MG tablet [id number] Yes TAKE 1/2 (ONE-HALF) TABLET BY MOUTH WITH [name], MD Taking Active   OXYGEN [id number] Yes Inhale 4 L into the lungs continuous. [provider] Taking Active Self  pantoprazole (PROTONIX) 20 MG tablet [id number] Yes Take 1 tablet (20 mg total) by mouth 2 (two) times daily. [name], MD Taking Active Self  potassium chloride SA (  KLOR-CON) 20 MEQ tablet [id number] Yes Take 2 tablets by mouth twice daily Sanjuan Dame, MD Taking Active   pravastatin (PRAVACHOL) 20 MG tablet [id number] Yes Take 1 tablet by mouth once daily [name], MD Taking Active Self  rivaroxaban (XARELTO) 20 MG TABS tablet [id number] Yes Take 1 tablet (20 mg total) by mouth daily with supper. Sanjuan Dame, MD Taking Active   sildenafil (REVATIO) 20 MG tablet [id number] Yes TAKE 1 TABLET THREE TIMES A DAY Bensimhon, [name], MD Taking Active   spironolactone (ALDACTONE) 25 MG tablet [id number] Yes Take 1/2 (one-half) tablet by mouth once daily Sanjuan Dame, MD Taking Active   torsemide (DEMADEX) 20 MG tablet [id number] Yes Take 4 tablets (80 mg total) by mouth 2 (two) times daily. Bensimhon, [name], MD Taking Active   treprostinil (REMODULIN) 5 MG/ML SOLN injection [id number] Yes See admin instructions. As of 08/09/2019: Add 7 ml of Remodulin to cassette and 93 ml of sterile diluent for Remodulin to cassetet for a total volume of 100 ml to make a concentration of 350,000 ng per ml.  Infuse via a CADD pump intravenously at a rate of 40 ml per 24 hours. Based on a dosing weight of 120 kg, the dose is 81 ng per kg per min.  Once opened, discard Remodulin vial after 30 days.  Sterile Diluent for Remodulin vials are single use only. Once mixed, discard cassette after 48 hours. [provider] Taking Active Self           Med Note Ralene Cork Dec 17, 2019 12:50 PM) Continuous infusion 12/17/19  vitamin B-12 (CYANOCOBALAMIN) 500 MCG tablet [id number] Yes Take 500 mcg by mouth daily. 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[place])    Right ovarian cyst  05/19/2017   Peripheral neuropathy ([place]) 09/27/2014   Healthcare maintenance 09/27/2014   Atrial flutter ([place]) 08/17/2012   Pulmonary hypertension ([place]) 08/16/2012   Right heart failure, NYHA class 3 ([place]) 03/15/2012   Allergic rhinitis 11/16/2011   Diabetes ([place]) 10/15/2010   Hyperlipidemia 10/15/2010   Obesity 10/14/2010   Obstructive sleep apnea 02/21/2009   GERD 11/29/2005    Conditions to be addressed/monitored per PCP order:  HTN, COPD, and Pulmonary Disease  Care Plan : Heart Failure (Adult)  Updates made by Melissa Montane, RN since 11/13/2020 12:00 AM  Completed 11/13/2020   Problem: [place] Management Resolved 11/13/2020  Note:   Resolving due to duplicate goal     Long-Range Goal: Symptom Exacerbation Prevented or Minimized Completed 11/13/2020  Start Date: 12/11/2019  Expected End Date: 11/07/2020  Recent Progress: On track  Priority: High  Note:   Current Barriers:  Chronic Disease Management support and education needs related management of multiple chronic diagnosis; CHF, COPD, OSA and T2DM. Today Ms Elgersma reports having episodes of dizziness. This is a new symptom  for her. She has not notified her provider. Provider notified, labs collected. Hgb down to 10.8 from 13.2. Patient reports that she has not been taking iron(d/c by physician) and she has not been eating a healthy diet. Dizziness has improved with resting and healthy diet. Discussed recent move with Ms. Holstad. She recently moved into a studio apartment. She is no longer living with her son, he does continue to pick up her medications. She has a bus stop 1.5 blocks away and knows that she can use medical transportation for appointments. She is adjusting to living on her own.-Update-Patient living in a studio apartment, on the same property as her son-he lives in the big house. She is interested in [facility]. She did not attend PT for lymphedema and would like a new referral placed.  Lacks social connections Does not  contact provider office for questions/concerns Resolving due to duplicate goal  Nurse Case Manager Clinical Goal(s):  patient will verbalize understanding of plan for management of new symptoms patient will attend all scheduled medical appointments: Pulmonology 02/26/20 and PCP 04/30/20 patient will demonstrate improved health management independence as evidenced by reporting new symptoms or concerns to provider-Met Interventions:  Inter-disciplinary care team collaboration (see longitudinal plan of care) Discussed plans with patient for ongoing care management follow up and provided patient with direct contact information for care management team Care Guide referral for assistance with social connections(local churches and exercise programs) Reviewed upcoming appointments: [facility] 10/24 and [facility] 10/27 Therapeutic listening Collaborate with PCP for new referral to PT for lymphedema and patient requesting PCS Encouraged patient to answer calls, as they may be for scheduling PT Reviewed medications Advised patient to have her son assist with clearing her voicemail to allow providers to leave a message if she is unable to answer her phone Patient Goals/Self-Care Activities - begin a heart failure diary - eat more whole grains, fruits and vegetables, lean meats and healthy fats - know when to call the doctor - track symptoms and what helps feel better or worse  -Self administers medications as prescribed  -Calls pharmacy for medication refills - Calls provider office for new concerns or questions - Incorporate more iron rich foods into your diet - increase activity, doing light exercises 2-3 times a week  Follow Up Plan: Telephone follow up appointment with Managed Medicaid care management team member scheduled for:     Care Plan : Health Management  Updates made by Melissa Montane, RN since 11/13/2020 12:00 AM  Completed 11/13/2020   Problem: Hemorrhoid Management  Resolved 11/13/2020     Care Plan : RN Care Manager Plan of Care  Updates made by Melissa Montane, RN since 11/13/2020 12:00 AM     Problem: Chronic Disease Management needs in patient with CHF, COPD and Pulmonary HTN   Priority: High     Long-Range Goal: Patient-Specific Goal   Start Date: 11/13/2020  Expected End Date: 02/11/2021  Priority: High  Note:   Current Barriers:  Chronic Disease Management support and education needs related to CHF, COPD, and Pulmonary Disease  RNCM Clinical Goal(s):  Patient will verbalize basic understanding of CHF, COPD, and Pulmonary Disease disease process and self health management plan as evidenced by attending all scheduled appointments, taking medications as prescribed and contacting PCP with any questions or concerns take all medications exactly as prescribed and will call provider for medication related questions as evidenced by refilling medications prior to running out    attend all scheduled  medical appointments: with PCP and Specialist as evidenced by physician notes in EMR        work with pharmacist to address medication management related to CHF, COPD, and Pulmonary Disease as evidenced by review of EMR and patient or pharmacist report    work with community resource care guide to address needs related to Social Isolation as evidenced by patient and/or community resource care guide support    through collaboration with Consulting civil engineer, provider, and care team.   Interventions: Inter-disciplinary care team collaboration (see longitudinal plan of care) Evaluation of current treatment plan related to  self management and patient's adherence to plan as established by provider Encouraged patient to schedule follow up with PCP and [place] referral has been placed for social isolation, advised patient to answer the phone and empty voicemail   Heart Failure Interventions:  (Status: New goal.)  Long Term Goal  Basic overview and discussion  of pathophysiology of Heart Failure reviewed Provided education on low sodium diet Discussed importance of daily weight and advised patient to weigh and record daily Discussed the importance of keeping all appointments with provider Provided patient with education about the role of exercise in the management of heart failure Assessed social determinant of health barriers  COPD: (Status: New goal.) Long Term Goal  Reviewed medications with patient, including use of prescribed maintenance and rescue inhalers, and provided instruction on medication management and the importance of adherence Advised patient to track and manage COPD triggers Advised patient to engage in light exercise as tolerated 3-5 days a week to aid in the the management of COPD Provided education about and advised patient to utilize infection prevention strategies to reduce risk of respiratory infection Discussed the importance of adequate rest and management of fatigue with COPD  Patient Goals/Self-Care Activities: call office if I gain more than 2 pounds in one day or 5 pounds in one week keep legs up while sitting track weight in diary use salt in moderation weigh myself daily eat more whole grains, fruits and vegetables, lean meats and healthy fats - avoid second hand smoke - limit outdoor activity during cold weather       Follow Up:  Patient agrees to Care Plan and Follow-up.  Plan: The Managed Medicaid care management team will reach out to the patient again over the next 30 days.  Date/time of next scheduled RN care management/care coordination outreach:  12/18/20 @ 1:15pm  [name] RN, BSN [place]  [employer] manager

## 2020-11-13 NOTE — Patient Instructions (Signed)
Visit Information  Ms. Barno was given information about Medicaid Managed Care team care coordination services as a part of their Healthy [facility] Medicaid benefit. [name] verbally consented to engagement with the [facility] Managed Care team.   If you are experiencing a medical emergency, please call 911 or report to your local emergency department or urgent care.   If you have a non-emergency medical problem during routine business hours, please contact your provider's office and ask to speak with a nurse.   For questions related to your Healthy [facility] health plan, please call: [phone] or visit the homepage here: [email or web address]  If you would like to schedule transportation through your Healthy [facility] plan, please call the following number at least 2 days in advance of your appointment: [phone]  Call the [place] at [phone], at any time, 24 hours a day, 7 days a week. If you are in danger or need immediate medical attention call 911.  If you would like help to quit smoking, call 1-800-QUIT-NOW (503)150-5257) OR Espaol: 1-855-Djelo-Ya (4-715-953-9672) o para ms informacin haga clic aqu or Text READY to 200-400 to register via text  [name] - following are the goals we discussed in your visit today:   Goals Addressed             This Visit's Progress    COMPLETED: Track and Manage Fluids and Swelling-Heart Failure       Resolving due to duplicate goal  Timeframe:  Long-Range Goal Priority:  High Start Date:   12/11/19                          Expected End Date: 11/07/20                    Follow Up Date 11/07/20   - call office if I gain more than 2 pounds in one day or 5 pounds in one week - keep legs up while sitting - track weight in diary - use salt in moderation - watch for swelling in feet, ankles and legs every day - weigh myself daily    Why is this important?   It is  important to check your weight daily and watch how much salt and liquids you have.  It will help you to manage your heart failure.         COMPLETED: Track and Manage Symptoms-Heart Failure       Resolving due to duplicate goal  Timeframe:  Long-Range Goal Priority:  High Start Date:   12/11/19                          Expected End Date:   11/07/20                Follow Up Date 11/08/19   - begin a heart failure diary - eat more whole grains, fruits and vegetables, lean meats and healthy fats - know when to call the doctor - track symptoms and what helps feel better or worse  - increase activity, doing light exercises 2-3 times a week   Why is this important?   You will be able to handle your symptoms better if you keep track of them.  Making some simple changes to your lifestyle will help.  Eating healthy is one thing you can do to take good care of yourself.  Please see education materials related to pulmonary HTN provided as print materials.   The patient verbalized understanding of instructions provided today and agreed to receive a mailed copy of patient instruction and/or educational materials.  Telephone follow up appointment with Managed Medicaid care management team member scheduled for:12/18/20 @ 1:15pm  [name] RN, BSN [place] RN Care Coordinator   Following is a copy of your plan of care:  Care Plan : Heart Failure (Adult)  Updates made by Melissa Montane, RN since 11/13/2020 12:00 AM  Completed 11/13/2020   Problem: Symtom Management Resolved 11/13/2020  Note:   Resolving due to duplicate goal     Long-Range Goal: Symptom Exacerbation Prevented or Minimized Completed 11/13/2020  Start Date: 12/11/2019  Expected End Date: 11/07/2020  Recent Progress: On track  Priority: High  Note:   Current Barriers:  Chronic Disease Management support and education needs related management of multiple chronic  diagnosis; CHF, COPD, OSA and T2DM. Today Ms Bagheri reports having episodes of dizziness. This is a new symptom for her. She has not notified her provider. Provider notified, labs collected. Hgb down to 10.8 from 13.2. Patient reports that she has not been taking iron(d/c by physician) and she has not been eating a healthy diet. Dizziness has improved with resting and healthy diet. Discussed recent move with Ms. Einstein. She recently moved into a studio apartment. She is no longer living with her son, he does continue to pick up her medications. She has a bus stop 1.5 blocks away and knows that she can use medical transportation for appointments. She is adjusting to living on her own.-Update-Patient living in a studio apartment, on the same property as her son-he lives in the big house. She is interested in [facility]. She did not attend PT for lymphedema and would like a new referral placed.  Lacks social connections Does not contact provider office for questions/concerns Resolving due to duplicate goal  Nurse Case Manager Clinical Goal(s):  patient will verbalize understanding of plan for management of new symptoms patient will attend all scheduled medical appointments: Pulmonology 02/26/20 and PCP 04/30/20 patient will demonstrate improved health management independence as evidenced by reporting new symptoms or concerns to provider-Met Interventions:  Inter-disciplinary care team collaboration (see longitudinal plan of care) Discussed plans with patient for ongoing care management follow up and provided patient with direct contact information for care management team Care Guide referral for assistance with social connections(local churches and exercise programs) Reviewed upcoming appointments: [facility] 10/24 and Eye Exam 10/27 Therapeutic listening Collaborate with PCP for new referral to PT for lymphedema and patient requesting PCS Encouraged patient to answer calls, as they may be for scheduling  PT Reviewed medications Advised patient to have her son assist with clearing her voicemail to allow providers to leave a message if she is unable to answer her phone Patient Goals/Self-Care Activities - begin a heart failure diary - eat more whole grains, fruits and vegetables, lean meats and healthy fats - know when to call the doctor - track symptoms and what helps feel better or worse  -Self administers medications as prescribed  -Calls pharmacy for medication refills - Calls provider office for new concerns or questions - Incorporate more iron rich foods into your diet - increase activity, doing light exercises 2-3 times a week  Follow Up Plan: Telephone follow up appointment with Managed Medicaid care management team member scheduled for:     Care Plan : Health  Management  Updates made by Melissa Montane, RN since 11/13/2020 12:00 AM  Completed 11/13/2020   Problem: Hemorrhoid Management Resolved 11/13/2020     Care Plan : RN Care Manager Plan of Care  Updates made by Melissa Montane, RN since 11/13/2020 12:00 AM     Problem: Chronic Disease Management needs in patient with CHF, COPD and Pulmonary HTN   Priority: High     Long-Range Goal: Patient-Specific Goal   Start Date: 11/13/2020  Expected End Date: 02/11/2021  Priority: High  Note:   Current Barriers:  Chronic Disease Management support and education needs related to CHF, COPD, and Pulmonary Disease  RNCM Clinical Goal(s):  Patient will verbalize basic understanding of CHF, COPD, and Pulmonary Disease disease process and self health management plan as evidenced by attending all scheduled appointments, taking medications as prescribed and contacting PCP with any questions or concerns take all medications exactly as prescribed and will call provider for medication related questions as evidenced by refilling medications prior to running out    attend all scheduled medical appointments: with PCP and Specialist as  evidenced by physician notes in EMR        work with pharmacist to address medication management related to CHF, COPD, and Pulmonary Disease as evidenced by review of EMR and patient or pharmacist report    work with community resource care guide to address needs related to Social Isolation as evidenced by patient and/or community resource care guide support    through collaboration with Consulting civil engineer, provider, and care team.   Interventions: Inter-disciplinary care team collaboration (see longitudinal plan of care) Evaluation of current treatment plan related to  self management and patient's adherence to plan as established by provider Encouraged patient to schedule follow up with PCP and GYN Care Guide referral has been placed for social isolation, advised patient to answer the phone and empty voicemail   Heart Failure Interventions:  (Status: New goal.)  Long Term Goal  Basic overview and discussion of pathophysiology of Heart Failure reviewed Provided education on low sodium diet Discussed importance of daily weight and advised patient to weigh and record daily Discussed the importance of keeping all appointments with provider Provided patient with education about the role of exercise in the management of heart failure Assessed social determinant of health barriers  COPD: (Status: New goal.) Long Term Goal  Reviewed medications with patient, including use of prescribed maintenance and rescue inhalers, and provided instruction on medication management and the importance of adherence Advised patient to track and manage COPD triggers Advised patient to engage in light exercise as tolerated 3-5 days a week to aid in the the management of COPD Provided education about and advised patient to utilize infection prevention strategies to reduce risk of respiratory infection Discussed the importance of adequate rest and management of fatigue with COPD  Patient Goals/Self-Care Activities: call  office if I gain more than 2 pounds in one day or 5 pounds in one week keep legs up while sitting track weight in diary use salt in moderation weigh myself daily eat more whole grains, fruits and vegetables, lean meats and healthy fats - avoid second hand smoke - limit outdoor activity during cold weather

## 2020-11-14 ENCOUNTER — Telehealth: Payer: Self-pay | Admitting: Pharmacist

## 2020-11-14 ENCOUNTER — Other Ambulatory Visit: Payer: Self-pay

## 2020-11-14 ENCOUNTER — Telehealth: Payer: Self-pay

## 2020-11-14 NOTE — Patient Outreach (Addendum)
11/14/2020 Name: [name] MRN: [id number] DOB: December 26, 1960  Referred by: Masters, [name], DO Reason for referral : No chief complaint on file.   An unsuccessful telephone outreach was attempted today. The patient was referred to the case management team for assistance with care management and care coordination. Unable to leave a voicemail, voicemail box is full.  Follow Up Plan: The Managed Medicaid care management team will reach out to the patient again over the next 10 days.   Hughes Better PharmD, CPP High Risk Managed Medicaid [place] [phone]

## 2020-11-14 NOTE — Telephone Encounter (Signed)
   Telephone encounter was:  Unsuccessful.  11/14/2020 Name: [name] MRN: [id number] DOB: Jun 24, 1960  Unsuccessful outbound call made today to assist with:   exercise and local churches  Outreach Attempt:  2nd Attempt   Unable to leave message voicemail is full.[name], AAS Paralegal, [place] Management  [address], [place] [address] ??[name]  ?? [phone]   [email or web address]

## 2020-11-16 ENCOUNTER — Other Ambulatory Visit: Payer: Self-pay | Admitting: Internal Medicine

## 2020-11-16 DIAGNOSIS — G629 Polyneuropathy, unspecified: Secondary | ICD-10-CM

## 2020-11-19 ENCOUNTER — Telehealth: Payer: Self-pay

## 2020-11-19 NOTE — Telephone Encounter (Signed)
   Telephone encounter was:  Unsuccessful.  11/19/2020 Name: TENAE GRAZIOSI MRN: [id number] DOB: [birth year]  Unsuccessful outbound call made today to assist with:   exercise groups, local churches.  Outreach Attempt:  3rd Attempt.  Referral closed unable to contact patient.  Unable to leave message voicemail is full.   Olivea Sonnen, AAS Paralegal, [place] [facility]  [address], [place] [address] ??[email or web address]  ?? [phone]   [email or web address]

## 2020-11-27 ENCOUNTER — Other Ambulatory Visit: Payer: Self-pay | Admitting: Internal Medicine

## 2020-11-27 DIAGNOSIS — K219 Gastro-esophageal reflux disease without esophagitis: Secondary | ICD-10-CM

## 2020-12-01 ENCOUNTER — Ambulatory Visit: Payer: Medicaid Other | Admitting: Internal Medicine

## 2020-12-01 ENCOUNTER — Other Ambulatory Visit: Payer: Self-pay

## 2020-12-01 VITALS — BP 114/64 | HR 84 | Temp 98.0°F | Wt 273.1 lb

## 2020-12-01 DIAGNOSIS — E119 Type 2 diabetes mellitus without complications: Secondary | ICD-10-CM | POA: Diagnosis not present

## 2020-12-01 DIAGNOSIS — N3946 Mixed incontinence: Secondary | ICD-10-CM | POA: Diagnosis not present

## 2020-12-01 DIAGNOSIS — Z79899 Other long term (current) drug therapy: Secondary | ICD-10-CM

## 2020-12-01 DIAGNOSIS — H539 Unspecified visual disturbance: Secondary | ICD-10-CM

## 2020-12-01 NOTE — Assessment & Plan Note (Addendum)
Patient presents with continued vision changes.  She states that she still sees "squiggly lines" that have been present since 08/22 and that her vision is cloudy.  This has gradually worsened over the last few months, she initially presented to ED in 08/22 due to sudden vision changes.  She was supposed to see optometrist, Dr. Einar Gip in October, however she was told appointment was canceled because she is only able to see eye doctor every 2 years with her insurance.  She is now having difficulty with putting her medications into her pill box and with cooking due to change in vision.  She reports that her left eye is more cloudy than her right eye.  Recently was evaluated by managed medicaid nurse and personal care services were suggested. She does not have difficulty with eating, dressing, bathing, toiletting, and ambulates with a walker.  Patient does not currently qualify for personal care services.   Physical exam with periorbital edema, darkening of skin around eyes, yellow discharge around eyelids bilaterally, conjunctiva non-erythematous, no clouding notable of lens . Patient is s/p cataract surgery. She complains of cloudy vision in her central vision that is getting worse and black lines that are intermittently present. No deficits in peripheral vision.   Assessment: Differentials include posterior capsule opacification vs macular degeneration vs diabetic retinopathy.  Last eye exam was in 2021 with no signs concerning for retinopathy at that time.  Plan: -Referral to ophthalmology for vision changes -referral to clinical pharmacist to help with reviewing medication and sending to pharmacy that could pre package medications

## 2020-12-01 NOTE — Progress Notes (Signed)
Subjective:  CC: vision changes, personal care services suggested by managed medicaid   HPI:  [name] is a 60 y.o. female with a past medical history stated below and presents today for vision changes leading to managed medicaid nurse suggesting personal care services.  Patient has difficulty with putting pills into pillbox and cooking, but she is able to dress and perform activities of daily living. Please see problem based assessment and plan for additional details.  Past Medical History:  Diagnosis Date   Acute on chronic diastolic (congestive) heart failure ([place]) 06/25/2013   Acute on chronic respiratory failure ([place]) 06/15/2018   Anemia, iron deficiency    secondary to menhorrhagia, on oral iron, also b12 def, getting monthly b12 shots   Cellulitis 05/2019   RIGHT LOWER EXTREMITY   CHF (congestive heart failure) (HCC)    Chronic cough    secondary to alleriges and post nasal drip   Cognitive impairment    COPD (chronic obstructive pulmonary disease) (HCC)    Cor pulmonale (HCC)    PA Peak pressure 4mHg   Depression    Diabetes mellitus    well controlled on metformin   Diastolic heart failure (HCC)    GERD (gastroesophageal reflux disease)    H/O mental retardation    Herpes    Hyperlipidemia    Hypertension    OSA (obstructive sleep apnea)    CPAP   [place]    Renal disorder    Restrictive lung disease    PFTs 06/2012 (FVC 54% predicted and FEV1 68% predicted w minimal bronchodilator response).   Shortness of breath    Venous stasis ulcer (HCC)    chornic, ?followed up at wound care center, multiple courses of antibiotics in past for cellulitis, on lasix    Current Outpatient Medications on File Prior to Visit  Medication Sig Dispense Refill   gabapentin (NEURONTIN) 300 MG capsule TAKE 2 CAPSULES BY MOUTH THREE TIMES DAILY 180 capsule 0   ACCU-CHEK AVIVA PLUS test strip USE 1 STRIP TO CHECK GLUCOSE ONCE DAILY (Patient not  taking: Reported on 10/24/2020) 50 each 3   acetaminophen (TYLENOL) 500 MG tablet Take 1,000 mg by mouth every 6 (six) hours as needed.     benzonatate (TESSALON) 200 MG capsule Take 1 capsule (200 mg total) by mouth 3 (three) times daily as needed for cough. 60 capsule 1   cetirizine (EQ ALLERGY RELIEF, CETIRIZINE,) 10 MG tablet Take 1 tablet (10 mg total) by mouth daily. 90 tablet 1   cycloSPORINE (RESTASIS) 0.05 % ophthalmic emulsion Place 1 drop into both eyes 2 (two) times daily as needed (dry eyes). (Patient not taking: No sig reported)     diphenhydrAMINE (BENADRYL) 25 MG tablet Take 50-100 mg by mouth 2 (two) times daily as needed for itching.     fluticasone (FLONASE) 50 MCG/ACT nasal spray Place 1 spray into both nostrils daily as needed for allergies. 11.1 mL 1   metFORMIN (GLUCOPHAGE) 500 MG tablet TAKE 1/2 (ONE-HALF) TABLET BY MOUTH WITH BREAKFAST 45 tablet 0   OXYGEN Inhale 4 L into the lungs continuous.     pantoprazole (PROTONIX) 20 MG tablet Take 1 tablet by mouth twice daily 180 tablet 2   potassium chloride SA (KLOR-CON) 20 MEQ tablet Take 2 tablets by mouth twice daily 180 tablet 0   pravastatin (PRAVACHOL) 20 MG tablet Take 1 tablet by mouth once daily 90 tablet 0   rivaroxaban (XARELTO) 20 MG TABS tablet Take  1 tablet (20 mg total) by mouth daily with supper. 90 tablet 2   sildenafil (REVATIO) 20 MG tablet TAKE 1 TABLET THREE TIMES A DAY 90 tablet 11   spironolactone (ALDACTONE) 25 MG tablet Take 1/2 (one-half) tablet by mouth once daily 45 tablet 0   torsemide (DEMADEX) 20 MG tablet Take 4 tablets (80 mg total) by mouth 2 (two) times daily. 240 tablet 6   treprostinil (REMODULIN) 5 MG/ML SOLN injection See admin instructions. As of 08/09/2019: Add 7 ml of Remodulin to cassette and 93 ml of sterile diluent for Remodulin to cassetet for a total volume of 100 ml to make a concentration of 350,000 ng per ml.  Infuse via a CADD pump intravenously at a rate of 40 ml per 24 hours. Based  on a dosing weight of 120 kg, the dose is 81 ng per kg per min.  Once opened, discard Remodulin vial after 30 days.  Sterile Diluent for Remodulin vials are single use only. Once mixed, discard cassette after 48 hours.     vitamin B-12 (CYANOCOBALAMIN) 500 MCG tablet Take 500 mcg by mouth daily.     No current facility-administered medications on file prior to visit.    Family History  Problem Relation Age of Onset   Kidney Stones Son    Mental illness Sister    Bipolar disorder Sister    Mental retardation Brother    Hyperlipidemia Mother    Breast cancer Maternal Aunt    Colon cancer Neg Hx    Pancreatic cancer Neg Hx    Esophageal cancer Neg Hx     Social History   Socioeconomic History   Marital status: Single    Spouse name: Not on file   Number of children: Not on file   Years of education: Not on file   Highest education level: Not on file  Occupational History   Not on file  Tobacco Use   Smoking status: Never   Smokeless tobacco: Never  Vaping Use   Vaping Use: Never used  Substance and Sexual Activity   Alcohol use: No    Alcohol/week: 0.0 standard drinks   Drug use: No   Sexual activity: Not Currently  Other Topics Concern   Not on file  Social History Narrative   Not on file   Social Determinants of Health   Financial Resource Strain: Not on file  Food Insecurity: No Food Insecurity   Worried About Charity fundraiser in the Last Year: Never true   Ran Out of Food in the Last Year: Never true  Transportation Needs: Not on file  Physical Activity: Not on file  Stress: Not on file  Social Connections: Socially Isolated   Frequency of Communication with Friends and Family: Once a week   Frequency of Social Gatherings with Friends and Family: Once a week   Attends Religious Services: Never   Marine scientist or Organizations: No   Attends Archivist Meetings: Never   Marital Status: Never married  Human resources officer Violence: Not on  file    Review of Systems: ROS negative except for what is noted on the assessment and plan.  Objective:   Vitals:   12/01/20 0949  BP: 114/64  Pulse: 84  Temp: 98 F (36.7 C)  TempSrc: Oral  SpO2: 93%  Weight: 273 lb 1.6 oz (123.9 kg)    Physical Exam: Gen: obese, appears older than stated age, chronically ill appearing, on supplemental oxygen sitting comfortably in  chair HEENT:    Head - normocephalic, atraumatic.    Eye - visual acuity grossly intact, conjunctiva clear, sclera non-icteric, EOM intact, yellow discharge present at canthus bilaterally, no clouding of lens noted, periorbital edema present with darkening of skin around eyes   Mouth - No obvious caries or periodontal disease. Neck: no masses or nodules, AROM intact, port in place for remodulin infusion CV: RRR, no murmurs, S1/S2 presents  Resp: Clear to auscultation bilaterally, speaks with broken sentences, Schamroth's sign consistent with clubbing of fingers Abd: BS (+) x4, soft, non-tender abdomen, without hepatosplenomegaly or masses MSK: Grossly normal AROM and strength x4 extremities. Skin: good skin turgor, no rashes, unusual bruising, or prominent lesions.  Neuro: No focal deficits, grossly normal sensation and coordination.  Psych: Oriented x3 and responding appropriately. Intact memory, normal mood, judgement, affect, and insight.    Assessment & Plan:  See Encounters Tab for problem based charting.  Patient seen with Dr. Walden Field Raymona Boss, D.O. [facility]  PGY-1 Pager: [phone]  Phone: [phone] Date 12/02/2020  Time 9:51 AM

## 2020-12-01 NOTE — Patient Instructions (Signed)
[name], it was a pleasure seeing you today!  Today we discussed: Vision changes I am referring you to your eye doctor due to the vision changes that you described today.  Please follow-up in 2 months so that we can make sure you've been seen by them and in the lymphedema clinic.  Pharmacy I would like you to meet with our pharmacist to help get your medications prepackaged.   I have ordered the following labs today:  Lab Orders  No laboratory test(s) ordered today     I will call if any are abnormal. All of your labs can be accessed through "My Chart"   My Chart Access: [email or web address]   Referrals ordered today:   Referral Orders         Ambulatory referral to Ophthalmology         Amb Referral to Clinical Pharmacist      I have ordered the following medication/changed the following medications:   Stop the following medications: There are no discontinued medications.   Start the following medications: No orders of the defined types were placed in this encounter.    Follow-up: 2 months   Please make sure to arrive 15 minutes prior to your next appointment. If you arrive late, you may be asked to reschedule.   We look forward to seeing you next time. Please call our clinic at [phone] if you have any questions or concerns. The best time to call is Monday-Friday from 9am-4pm, but there is someone available 24/7. If after hours or the weekend, call the main hospital number and ask for the Internal Medicine Resident On-Call. If you need medication refills, please notify your pharmacy one week in advance and they will send [place] a request.  Thank you for letting us take part in your care. Wishing you the best!  Thank you, Dr. [facility]

## 2020-12-04 DIAGNOSIS — J984 Other disorders of lung: Secondary | ICD-10-CM | POA: Diagnosis not present

## 2020-12-04 DIAGNOSIS — I5030 Unspecified diastolic (congestive) heart failure: Secondary | ICD-10-CM | POA: Diagnosis not present

## 2020-12-04 DIAGNOSIS — G4730 Sleep apnea, unspecified: Secondary | ICD-10-CM | POA: Diagnosis not present

## 2020-12-04 DIAGNOSIS — I270 Primary pulmonary hypertension: Secondary | ICD-10-CM | POA: Diagnosis not present

## 2020-12-08 ENCOUNTER — Ambulatory Visit (INDEPENDENT_AMBULATORY_CARE_PROVIDER_SITE_OTHER): Payer: Medicaid Other | Admitting: Pharmacist

## 2020-12-08 ENCOUNTER — Ambulatory Visit: Payer: Medicaid Other | Admitting: Behavioral Health

## 2020-12-08 ENCOUNTER — Telehealth: Payer: Self-pay | Admitting: Behavioral Health

## 2020-12-08 DIAGNOSIS — Z79899 Other long term (current) drug therapy: Secondary | ICD-10-CM | POA: Diagnosis not present

## 2020-12-08 NOTE — Telephone Encounter (Signed)
Unable to lv VM for Pt today due to Mbox being full. Called Adult Son Legrand Como to alert him to situation. Son sts he will see his Mother this evening & empty her VMBox so she can receive necessary calls from [facility]. Related to Son I will put his Mother back on my schedule & hopefully speak before Christmas. Son acknowledged & agreed.  Dr. Theodis Shove

## 2020-12-08 NOTE — Patient Instructions (Addendum)
Miss Greenlaw it was a pleasure seeing you today.   Today we reviewed all of the medications you are currently taking. Included is an updated medication list. Please continue taking all medications as prescribed on this list.  To help you remember to take your medications:  We are sending your medications to: [facility], [place], [place] [address] Phone: [phone] Please ask them to pill package your medications for you  If you have any questions please call the clinic and ask to speak with me.  Follow-up with me on telephone on January 17th

## 2020-12-08 NOTE — Progress Notes (Signed)
   Subjective:    [name], female    DOB: 1960-05-25, 60 y.o.   MRN: [id number]  HPI Patient is a 60 y.o. female who presents for medication review and management. She is in good spirits and presents with assistance of walker and oxygen. Patient was referred and last seen by Primary Care Provider on 12/01/20  Medication Adherence Questionnaire (A score of 2 or more points indicates risk for nonadherence)  Do you know what each of your medicines is for? 0 (1 point if no)  Do you ever have trouble remembering to take your medicine? 1 (2 points if yes)  Do you ever not take a medicine because you feel you do not need it?  0 (1 point if yes)  Do you think that any of your medicines is not helping you? 1; gabapentin  (1 point if yes)  Do you have any physical problems such as vision loss that keep you from taking your medicines as prescribed?  2; vision (2 points if yes)  Do you think any of your medicine is causing a side effect? 0 (1 point if yes)  Do you know the names of ALL of your medicines? 0 (1 point if no)  Do you think that you need ALL of your medicines? 0 (1 point if no)  In the past 6 months, have you missed getting a refill or a new prescription filled on time?  0 (1 point if yes)  How often do you miss taking a dose of medicine?  2 Never (0 points), 1 or 2 times a month (1 points), 1 time a week (2 points), 2 or more times a week (3 points).   TOTAL SCORE 6/14    Objective:   Labs:   Physical Exam Neurological:     Mental Status: She is alert and oriented to person, place, and time.    Assessment/Plan:   Understanding of regimen: good  Understanding of indications: good  Potential of compliance: fair  Patient has known adherence challenges based on score of 6 for questionnaire. Barriers include: forgetfulness and physical barriers such as vision loss. Medication list reviewed and updated. Discussed with patient options of pill packaging medications.  Will send message to PCP requesting medications be sent to [place] for pill-packaging. Patient was provided with a printed medication list.   Follow-up appointment via telephone in 6 weeks to confirm if patient is able to obtain pill packaging services . Written patient instructions provided.  This appointment required 30 minutes of direct patient care.  Thank you for involving pharmacy to assist in providing this patient's care.

## 2020-12-09 ENCOUNTER — Other Ambulatory Visit: Payer: Self-pay | Admitting: Internal Medicine

## 2020-12-09 ENCOUNTER — Encounter: Payer: Self-pay | Admitting: Internal Medicine

## 2020-12-09 DIAGNOSIS — I50810 Right heart failure, unspecified: Secondary | ICD-10-CM

## 2020-12-09 DIAGNOSIS — E538 Deficiency of other specified B group vitamins: Secondary | ICD-10-CM

## 2020-12-09 DIAGNOSIS — J069 Acute upper respiratory infection, unspecified: Secondary | ICD-10-CM

## 2020-12-09 DIAGNOSIS — K219 Gastro-esophageal reflux disease without esophagitis: Secondary | ICD-10-CM

## 2020-12-09 DIAGNOSIS — I5033 Acute on chronic diastolic (congestive) heart failure: Secondary | ICD-10-CM

## 2020-12-09 DIAGNOSIS — E7800 Pure hypercholesterolemia, unspecified: Secondary | ICD-10-CM

## 2020-12-09 DIAGNOSIS — G629 Polyneuropathy, unspecified: Secondary | ICD-10-CM

## 2020-12-09 DIAGNOSIS — I4892 Unspecified atrial flutter: Secondary | ICD-10-CM

## 2020-12-09 MED ORDER — TORSEMIDE 20 MG PO TABS: 80.0000 mg | ORAL_TABLET | Freq: Two times a day (BID) | ORAL | 6 refills | Status: DC

## 2020-12-09 MED ORDER — RIVAROXABAN 20 MG PO TABS: 20.0000 mg | ORAL_TABLET | Freq: Every day | ORAL | 2 refills | Status: DC

## 2020-12-09 MED ORDER — CETIRIZINE HCL 10 MG PO TABS: 10.0000 mg | ORAL_TABLET | Freq: Every day | ORAL | 1 refills | Status: DC

## 2020-12-09 MED ORDER — GABAPENTIN 300 MG PO CAPS: 600.0000 mg | ORAL_CAPSULE | Freq: Three times a day (TID) | ORAL | 0 refills | Status: DC

## 2020-12-09 MED ORDER — POTASSIUM CHLORIDE CRYS ER 20 MEQ PO TBCR: 40.0000 meq | EXTENDED_RELEASE_TABLET | Freq: Two times a day (BID) | ORAL | 0 refills | Status: DC

## 2020-12-09 MED ORDER — METFORMIN HCL 500 MG PO TABS: ORAL_TABLET | ORAL | 0 refills | Status: DC

## 2020-12-09 MED ORDER — CYANOCOBALAMIN 500 MCG PO TABS: 500.0000 ug | ORAL_TABLET | Freq: Every day | ORAL | 4 refills | Status: DC

## 2020-12-09 MED ORDER — SPIRONOLACTONE 25 MG PO TABS: ORAL_TABLET | ORAL | 1 refills | Status: DC

## 2020-12-09 MED ORDER — PANTOPRAZOLE SODIUM 20 MG PO TBEC: 20.0000 mg | DELAYED_RELEASE_TABLET | Freq: Two times a day (BID) | ORAL | 2 refills | Status: DC

## 2020-12-09 MED ORDER — PRAVASTATIN SODIUM 20 MG PO TABS
20.0000 mg | ORAL_TABLET | Freq: Every day | ORAL | 2 refills | Status: DC
Start: 2020-12-09 — End: 2021-09-22

## 2020-12-09 NOTE — Progress Notes (Signed)
Sent prescriptions to [facility] for pill packaging.

## 2020-12-11 ENCOUNTER — Ambulatory Visit: Payer: Medicaid Other | Admitting: Internal Medicine

## 2020-12-11 ENCOUNTER — Other Ambulatory Visit: Payer: Self-pay

## 2020-12-11 ENCOUNTER — Encounter: Payer: Self-pay | Admitting: Internal Medicine

## 2020-12-11 VITALS — BP 128/51 | HR 91 | Temp 98.0°F | Ht 63.0 in | Wt 273.5 lb

## 2020-12-11 DIAGNOSIS — M1711 Unilateral primary osteoarthritis, right knee: Secondary | ICD-10-CM

## 2020-12-11 DIAGNOSIS — Z23 Encounter for immunization: Secondary | ICD-10-CM | POA: Diagnosis not present

## 2020-12-11 DIAGNOSIS — E1142 Type 2 diabetes mellitus with diabetic polyneuropathy: Secondary | ICD-10-CM

## 2020-12-11 DIAGNOSIS — I4892 Unspecified atrial flutter: Secondary | ICD-10-CM

## 2020-12-11 MED ORDER — DICLOFENAC SODIUM 1 % EX GEL: 4.0000 g | Freq: Four times a day (QID) | CUTANEOUS | 3 refills | Status: DC

## 2020-12-11 NOTE — Progress Notes (Signed)
Internal Medicine Clinic Attending  I saw and evaluated the patient.  I personally confirmed the key portions of the history and exam documented by Dr.  Howie Ill  and I reviewed pertinent patient test results.  The assessment, diagnosis, and plan were formulated together and I agree with the documentation in the resident's note.   Based on Dr. Howie Ill assessment, she does not meet criteria for PCS at this time.  This may change in future.  Ophthalmology referral placed.

## 2020-12-11 NOTE — Patient Instructions (Signed)
Thank you, [name] for allowing [place] to provide your care today. Today we discussed knee pain.    Labs/Tests Ordered: Lab Orders  No laboratory test(s) ordered today     Referrals Ordered:  Referral Orders  No referral(s) requested today     Medication Changes:  There are no discontinued medications.   No orders of the defined types were placed in this encounter.    Health Maintenance Screening: Diabetes Health Maintenance Due  Topic Date Due   URINE MICROALBUMIN  10/21/2017   OPHTHALMOLOGY EXAM  10/29/2020   FOOT EXAM  05/05/2021   LIPID PANEL  05/05/2021     Instructions:   Follow up: 3 months   Remember: If you have any questions or concerns, call our clinic at [phone] or after hours call [phone] and ask for the internal medicine resident on call.  Marianna Payment, D.O. [facility]

## 2020-12-11 NOTE — Progress Notes (Signed)
Subjective:  CC: Knee pain  HPI:  Ms.Kallista A Nissen is a 60 y.o. female with a past medical history stated below and presents today for knee pain. Please see problem based assessment and plan for additional details.  Past Medical History:  Diagnosis Date   Acute on chronic diastolic (congestive) heart failure ([place]) 06/25/2013   Acute on chronic respiratory failure ([place]) 06/15/2018   Anemia, iron deficiency    secondary to menhorrhagia, on oral iron, also b12 def, getting monthly b12 shots   Cellulitis 05/2019   RIGHT LOWER EXTREMITY   CHF (congestive heart failure) (HCC)    Chronic cough    secondary to alleriges and post nasal drip   Cognitive impairment    COPD (chronic obstructive pulmonary disease) (HCC)    Cor pulmonale (HCC)    PA Peak pressure 64mHg   Depression    Diabetes mellitus    well controlled on metformin   Diastolic heart failure (HCC)    GERD (gastroesophageal reflux disease)    H/O mental retardation    Herpes    Hyperlipidemia    Hypertension    OSA (obstructive sleep apnea)    CPAP   Pulmonary hypertension ([place]    Renal disorder    Restrictive lung disease    PFTs 06/2012 (FVC 54% predicted and FEV1 68% predicted w minimal bronchodilator response).   Shortness of breath    Venous stasis ulcer (HCC)    chornic, ?followed up at wound care center, multiple courses of antibiotics in past for cellulitis, on lasix    Current Outpatient Medications on File Prior to Visit  Medication Sig Dispense Refill   ACCU-CHEK AVIVA PLUS test strip USE 1 STRIP TO CHECK GLUCOSE ONCE DAILY (Patient not taking: Reported on 10/24/2020) 50 each 3   acetaminophen (TYLENOL) 500 MG tablet Take 1,000 mg by mouth every 6 (six) hours as needed.     benzonatate (TESSALON) 200 MG capsule Take 1 capsule (200 mg total) by mouth 3 (three) times daily as needed for cough. (Patient not taking: Reported on 12/08/2020) 60 capsule 1   cetirizine (EQ ALLERGY RELIEF, CETIRIZINE,) 10 MG  tablet Take 1 tablet (10 mg total) by mouth daily. 90 tablet 1   cycloSPORINE (RESTASIS) 0.05 % ophthalmic emulsion Place 1 drop into both eyes 2 (two) times daily as needed (dry eyes). (Patient not taking: Reported on 10/24/2020)     diphenhydrAMINE (BENADRYL) 25 MG tablet Take 50-100 mg by mouth 2 (two) times daily as needed for itching. (Patient not taking: Reported on 12/08/2020)     Ferrous Sulfate (IRON PO) Take by mouth.     FIBER ADULT GUMMIES PO Take by mouth.     fluticasone (FLONASE) 50 MCG/ACT nasal spray Place 1 spray into both nostrils daily as needed for allergies. 11.1 mL 1   gabapentin (NEURONTIN) 300 MG capsule Take 2 capsules (600 mg total) by mouth 3 (three) times daily. 180 capsule 0   metFORMIN (GLUCOPHAGE) 500 MG tablet Take 1/2 tablet by mouth with breakfast 45 tablet 0   OXYGEN Inhale 4 L into the lungs continuous.     pantoprazole (PROTONIX) 20 MG tablet Take 1 tablet (20 mg total) by mouth 2 (two) times daily. 180 tablet 2   potassium chloride SA (KLOR-CON M) 20 MEQ tablet Take 2 tablets (40 mEq total) by mouth 2 (two) times daily. 180 tablet 0   pravastatin (PRAVACHOL) 20 MG tablet Take 1 tablet (20 mg total) by mouth daily. 90 tablet  2   rivaroxaban (XARELTO) 20 MG TABS tablet Take 1 tablet (20 mg total) by mouth daily with supper. 90 tablet 2   sildenafil (REVATIO) 20 MG tablet TAKE 1 TABLET THREE TIMES A DAY 90 tablet 11   spironolactone (ALDACTONE) 25 MG tablet Take 1/2 tablet by mouth once daily. 45 tablet 1   torsemide (DEMADEX) 20 MG tablet Take 4 tablets (80 mg total) by mouth 2 (two) times daily. 240 tablet 6   treprostinil (REMODULIN) 5 MG/ML SOLN injection See admin instructions. As of 08/09/2019: Add 7 ml of Remodulin to cassette and 93 ml of sterile diluent for Remodulin to cassetet for a total volume of 100 ml to make a concentration of 350,000 ng per ml.  Infuse via a CADD pump intravenously at a rate of 40 ml per 24 hours. Based on a dosing weight of 120 kg,  the dose is 81 ng per kg per min.  Once opened, discard Remodulin vial after 30 days.  Sterile Diluent for Remodulin vials are single use only. Once mixed, discard cassette after 48 hours.     vitamin B-12 (CYANOCOBALAMIN) 500 MCG tablet Take 1 tablet (500 mcg total) by mouth daily. 30 tablet 4   No current facility-administered medications on file prior to visit.    Family History  Problem Relation Age of Onset   Kidney Stones Son    Mental illness Sister    Bipolar disorder Sister    Mental retardation Brother    Hyperlipidemia Mother    Breast cancer Maternal Aunt    Colon cancer Neg Hx    Pancreatic cancer Neg Hx    Esophageal cancer Neg Hx     Social History   Socioeconomic History   Marital status: Single    Spouse name: Not on file   Number of children: Not on file   Years of education: Not on file   Highest education level: Not on file  Occupational History   Not on file  Tobacco Use   Smoking status: Never   Smokeless tobacco: Never  Vaping Use   Vaping Use: Never used  Substance and Sexual Activity   Alcohol use: No    Alcohol/week: 0.0 standard drinks   Drug use: No   Sexual activity: Not Currently  Other Topics Concern   Not on file  Social History Narrative   Not on file   Social Determinants of Health   Financial Resource Strain: Not on file  Food Insecurity: No Food Insecurity   Worried About Charity fundraiser in the Last Year: Never true   Ran Out of Food in the Last Year: Never true  Transportation Needs: Not on file  Physical Activity: Not on file  Stress: Not on file  Social Connections: Socially Isolated   Frequency of Communication with Friends and Family: Once a week   Frequency of Social Gatherings with Friends and Family: Once a week   Attends Religious Services: Never   Marine scientist or Organizations: No   Attends Archivist Meetings: Never   Marital Status: Never married  Human resources officer Violence: Not on file     Review of Systems: ROS negative except for what is noted on the assessment and plan.  Objective:   Vitals:   12/11/20 1536  BP: (!) 128/51  Pulse: 91  Temp: 98 F (36.7 C)  TempSrc: Oral  SpO2: 95%  Weight: 273 lb 8 oz (124.1 kg)  Height: _0  (1.6 m)  Physical Exam: Gen: A&O x3 and in no apparent distress, well appearing and nourished. Neck: no masses or nodules, AROM intact. CV: RRR, no murmurs, S1/S2 presents  Resp: Clear to ascultation bilaterally  Abd: BS (+) x4, soft, non-tender abdomen, without hepatosplenomegaly or masses MSK: Grossly normal AROM and strength x4 extremities. Right knee pain with TTP Skin: good skin turgor, no rashes, unusual bruising, or prominent lesions.  Neuro: No focal deficits, grossly normal sensation and coordination.  Psych: Oriented x3 and responding appropriately. Intact memory, normal mood, judgement, affect, and insight.    Assessment & Plan:  See Encounters Tab for problem based charting.  Patient discussed with Dr. Kelle Darting, D.O. [facility]  PGY-3 Pager: [phone]  Phone: [phone] Date 12/12/2020  Time 5:20 PM

## 2020-12-12 ENCOUNTER — Encounter: Payer: Self-pay | Admitting: Internal Medicine

## 2020-12-12 DIAGNOSIS — M179 Osteoarthritis of knee, unspecified: Secondary | ICD-10-CM | POA: Insufficient documentation

## 2020-12-12 NOTE — Assessment & Plan Note (Addendum)
Patient has a hisotry of atrial flutter. She is currently in regular rate with aV nodal agents. She take Xarelto without any signs of recent bleeding.  Plan: - Continue Xarelto

## 2020-12-12 NOTE — Assessment & Plan Note (Signed)
Patient endorses a history of bilateral knee osteoarthritis and presents with right knee pain. She has previously had steroid injections at the [facility] with good response. She denies any signs of infection today and there is no local evidence of infection in the right knee joint.    Plan: - Steroid injection  - Voltaren gel  **Procedure Note**  Steroid Injection of the Knee Procedure Note  Diagnosis: right   Indications: Symptom relief from osteoarthritis  Anesthesia: Lidocaine 1% without epinephrine  Procedure Details   The patient was provided with risks, benefits, and alternatives to intraarticular injection. Consent was obtained for the procedure. After a time out was preformed, the knee was prepped in a sterile fashion. The joint was prepped with Betadine. 2 ml 1% lidocaine and 1 ml of Triamcinolone was injected into the right knee joint space. The knee space was entered without difficulty. The needle was removed and the area cleansed and dressed.  Complications:  None; patient tolerated the procedure well.

## 2020-12-12 NOTE — Assessment & Plan Note (Signed)
Presents for reevaluation of DM. She is on metformin 500 mg and tolerating it well.    Hemoglobin A1C Latest Ref Rng & Units 05/05/2020  HGBA1C 4.0 - 5.6 % 5.6  Some recent data might be hidden    Plan: - A1c repeated today - Cont metformin

## 2020-12-15 NOTE — Progress Notes (Signed)
Internal Medicine Clinic Attending  I saw and evaluated the patient.  I personally confirmed the key portions of the history and exam documented by Dr. Marianna Payment and I reviewed pertinent patient test results.  The assessment, diagnosis, and plan were formulated together and I agree with the documentation in the resident's note.   I was present for the procedure.

## 2020-12-16 ENCOUNTER — Ambulatory Visit: Payer: Medicaid Other

## 2020-12-18 ENCOUNTER — Other Ambulatory Visit: Payer: Self-pay

## 2020-12-18 ENCOUNTER — Other Ambulatory Visit: Payer: Self-pay | Admitting: *Deleted

## 2020-12-18 NOTE — Patient Outreach (Signed)
Medicaid Managed Care   Nurse Care Manager Note  12/18/2020 Name:  [name] MRN:  [id number] DOB:  [birth year]  [name] is an 60 y.o. year old female who is a primary patient of Barker, Faith Jersey, Barker.  The [facility] Managed Care Coordination team was consulted for assistance with:    CHF COPD  [name] was given information about Medicaid Managed Care Coordination team services today. [name] Patient agreed to services and verbal consent obtained.  Engaged with patient by telephone for follow up visit in response to provider referral for case management and/or care coordination services.   Assessments/Interventions:  Review of past medical history, allergies, medications, health status, including review of consultants reports, laboratory and other test data, was performed as part of comprehensive evaluation and provision of chronic care management services.  SDOH (Social Determinants of Health) assessments and interventions performed: SDOH Interventions    Flowsheet Row Most Recent Value  SDOH Interventions   Transportation Interventions Intervention Not Indicated       Care Plan  Allergies  Allergen Reactions   Aspirin Swelling    REACTION: airway swelling   Codeine Other (See Comments)    REACTION: tingling in lips and hard breathing - had reaction at dentist - states "I can't take certain kinds of codeine" - happened maybe 10 yr ago   Lisinopril Cough   Sulfonamide Derivatives Swelling    REACTION: airway swelling   Latex Rash    Medications Reviewed Today     Reviewed by Faith Payment, MD (Resident) on 12/12/20 at 1721  Med List Status: <None>   Medication Order Taking? Sig Documenting Provider Last Dose Status Informant  ACCU-CHEK AVIVA PLUS test strip [id number] No USE 1 STRIP TO CHECK GLUCOSE ONCE DAILY  Patient not taking: Reported on 10/24/2020   Faith Roan, MD Not Taking Active Self  acetaminophen (TYLENOL) 500 MG tablet [id number] No  Take 1,000 mg by mouth every 6 (six) hours as needed. [provider] Taking Active   benzonatate (TESSALON) 200 MG capsule [id number] No Take 1 capsule (200 mg total) by mouth 3 (three) times daily as needed for cough.  Patient not taking: Reported on 12/08/2020   Faith Coder, MD Not Taking Active Self  cetirizine (EQ ALLERGY RELIEF, CETIRIZINE,) 10 MG tablet [id number]  Take 1 tablet (10 mg total) by mouth daily. Barker, Faith Jersey, Barker  Active   cycloSPORINE (RESTASIS) 0.05 % ophthalmic emulsion [id number] No Place 1 drop into both eyes 2 (two) times daily as needed (dry eyes).  Patient not taking: Reported on 10/24/2020   [provider] Not Taking Active   diclofenac Sodium (VOLTAREN) 1 % GEL [id number] Yes Apply 4 g topically 4 (four) times daily. Faith Payment, MD  Active   diphenhydrAMINE (BENADRYL) 25 MG tablet [id number] No Take 50-100 mg by mouth 2 (two) times daily as needed for itching.  Patient not taking: Reported on 12/08/2020   [provider] Not Taking Active Self  Ferrous Sulfate (IRON PO) [id number] No Take by mouth. [provider] Taking Active   FIBER ADULT GUMMIES PO [id number] No Take by mouth. [provider] Taking Active   fluticasone (FLONASE) 50 MCG/ACT nasal spray [id number] No Place 1 spray into both nostrils daily as needed for allergies. Faith Noble, MD Taking Active Self  gabapentin (NEURONTIN) 300 MG capsule [id number]  Take 2 capsules (600 mg total) by mouth 3 (three) times daily. Barker, Faith Jersey, Barker  Active   metFORMIN (  GLUCOPHAGE) 500 MG tablet [id number]  Take 1/2 tablet by mouth with breakfast Faith Fuchs, Barker  Active   OXYGEN [id number] No Inhale 4 L into the lungs continuous. [provider] Taking Active Self  pantoprazole (PROTONIX) 20 MG tablet [id number]  Take 1 tablet (20 mg total) by mouth 2 (two) times daily. Barker, Faith Jersey, Barker  Active   potassium chloride SA (KLOR-CON M) 20 MEQ tablet [id number]   Take 2 tablets (40 mEq total) by mouth 2 (two) times daily. Barker, Faith Jersey, Barker  Active   pravastatin (PRAVACHOL) 20 MG tablet [id number]  Take 1 tablet (20 mg total) by mouth daily. Barker, Faith Jersey, Barker  Active   rivaroxaban (XARELTO) 20 MG TABS tablet [id number]  Take 1 tablet (20 mg total) by mouth daily with supper. Barker, Faith Jersey, Barker  Active   sildenafil (REVATIO) 20 MG tablet [id number] No TAKE 1 TABLET THREE TIMES A DAY Bensimhon, [name], MD Taking Active   spironolactone (ALDACTONE) 25 MG tablet [id number]  Take 1/2 tablet by mouth once daily. Barker, Faith Jersey, Barker  Active   torsemide (DEMADEX) 20 MG tablet [id number]  Take 4 tablets (80 mg total) by mouth 2 (two) times daily. Barker, Faith Jersey, Barker  Active   treprostinil (REMODULIN) 5 MG/ML SOLN injection [id number] No See admin instructions. As of 08/09/2019: Add 7 ml of Remodulin to cassette and 93 ml of sterile diluent for Remodulin to cassetet for a total volume of 100 ml to make a concentration of 350,000 ng per ml.  Infuse via a CADD pump intravenously at a rate of 40 ml per 24 hours. Based on a dosing weight of 120 kg, the dose is 81 ng per kg per min.  Once opened, discard Remodulin vial after 30 days.  Sterile Diluent for Remodulin vials are single use only. Once mixed, discard cassette after 48 hours. [provider] Taking Active Self           Med Note Ralene Cork Dec 17, 2019 12:50 PM) Continuous infusion 12/17/19  vitamin B-12 (CYANOCOBALAMIN) 500 MCG tablet [id number]  Take 1 tablet (500 mcg total) by mouth daily. 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   Bleeding from the genitourinary system 09/13/2019   Gross hematuria 09/13/2019   Chronic venous stasis 05/07/2019   Urinary incontinence 12/12/2018   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[place])    Right ovarian cyst 05/19/2017   Peripheral neuropathy ([place]) 09/27/2014   Healthcare maintenance 09/27/2014   Atrial flutter ([place]) 08/17/2012   Right heart failure, NYHA class 3 ([place]) 03/15/2012   Allergic rhinitis 11/16/2011   Diabetes ([place]) 10/15/2010   Hyperlipidemia 10/15/2010   Obesity 10/14/2010   Obstructive sleep apnea 02/21/2009   GERD 11/29/2005    Conditions to be addressed/monitored per PCP order:  CHF and COPD  Care Plan : RN Care Manager Plan of Care  Updates made by Melissa Montane, RN since 12/18/2020 12:00 AM     Problem: Chronic Disease Management needs in patient with  CHF, COPD and Pulmonary HTN   Priority: High     Long-Range Goal: Development of Plan of Care for Chronic Disease Management needs in patient with CHF, COPD and Pulmonary HTN   Start Date: 11/13/2020  Expected End Date: 02/11/2021  Priority: High  Note:   Current Barriers:  Chronic Disease Management support and education needs related to CHF, COPD, and Pulmonary Disease  RNCM Clinical Goal(s):  Patient will verbalize basic understanding of CHF, COPD, and Pulmonary Disease disease process and self health management plan as evidenced by attending all scheduled appointments, taking medications as prescribed and contacting PCP with any questions or concerns take all medications exactly as prescribed and will call provider for medication related questions as evidenced by refilling medications prior to running out    attend all scheduled medical appointments: with PCP and Specialist as evidenced by physician notes in EMR        work with pharmacist to address medication management related to CHF, COPD, and Pulmonary Disease  as evidenced by review of EMR and patient or pharmacist report    work with community resource care guide to address needs related to Social Isolation as evidenced by patient and/or community resource care guide support    through collaboration with Consulting civil engineer, provider, and care team.   Interventions: Inter-disciplinary care team collaboration (see longitudinal plan of care) Evaluation of current treatment plan related to  self management and patient's adherence to plan as established by provider   Heart Failure Interventions:  (Status: Goal on Track (progressing): YES.)  Long Term Goal  Discussed the importance of keeping all appointments with provider Provided patient with education about the role of exercise in the management of heart failure Assessed social determinant of health barriers Discussed upcoming appointment with [facility], patient will call to arrange transportation tomorrow Encouraged patient to take all medications as directed, she is now receiving medications in compliance packaging Provided information on exercises to Barker while sitting  COPD: (Status: Goal on Track (progressing): YES.) Long Term Goal  Reviewed medications with patient, including use of prescribed maintenance and rescue inhalers, and provided instruction on medication management and the importance of adherence Advised patient to self assesses COPD action plan zone and make appointment with provider if in the yellow zone for 48 hours without improvement Advised patient to engage in light exercise as tolerated 3-5 days a week to aid in the the management of COPD Provided education about and advised patient to utilize infection prevention strategies to reduce risk of respiratory infection Assessed social determinant of health barriers  Patient Goals/Self-Care Activities: call office if I gain more than 2 pounds in one day or 5 pounds in one week keep legs up while sitting track weight in  diary use salt in moderation weigh myself daily eat more whole grains, fruits and vegetables, lean meats and healthy fats - avoid second hand smoke - limit outdoor activity during cold weather       Follow Up:  Patient agrees to Care Plan and Follow-up.  Plan: The Managed Medicaid care management team will reach out to the patient again over the next 30 days.  Date/time of next scheduled RN care management/care coordination outreach:  01/08/21 @ 2:30pm  [name] RN, BSN [place] RN Care Coordinator

## 2020-12-18 NOTE — Patient Instructions (Signed)
Visit Information  [name] was given information about Medicaid Managed Care team care coordination services as a part of their Healthy [facility] Medicaid benefit. [name] verbally consented to engagement with the [facility] Managed Care team.   If you are experiencing a medical emergency, please call 911 or report to your local emergency department or urgent care.   If you have a non-emergency medical problem during routine business hours, please contact your provider's office and ask to speak with a nurse.   For questions related to your Healthy [facility] health plan, please call: [phone] or visit the homepage here: [email or web address]  If you would like to schedule transportation through your Healthy [facility] plan, please call the following number at least 2 days in advance of your appointment: [phone]  Call the [place] at [phone], at any time, 24 hours a day, 7 days a week. If you are in danger or need immediate medical attention call 911.  If you would like help to quit smoking, call 1-800-QUIT-NOW (409) 740-7293) OR Espaol: 1-855-Djelo-Ya (7-948-016-5537) o para ms informacin haga clic aqu or Text READY to 200-400 to register via text  [name] - following are the goals we discussed in your visit today:   Goals Addressed   None     Please see education materials related to COPD, exercise and infection prevention provided as print materials.   The patient verbalized understanding of instructions provided today and agreed to receive a mailed copy of patient instruction and/or educational materials.  Telephone follow up appointment with Managed Medicaid care management team member scheduled for:01/08/21 @ 2:30pm  Faith Joiner RN, BSN [place] RN Care Coordinator   Following is a copy of your plan of care:  Care Plan : RN Care Manager Plan of Care  Updates made  by Melissa Montane, RN since 12/18/2020 12:00 AM     Problem: Chronic Disease Management needs in patient with CHF, COPD and Pulmonary HTN   Priority: High     Long-Range Goal: Development of Plan of Care for Chronic Disease Management needs in patient with CHF, COPD and Pulmonary HTN   Start Date: 11/13/2020  Expected End Date: 02/11/2021  Priority: High  Note:   Current Barriers:  Chronic Disease Management support and education needs related to CHF, COPD, and Pulmonary Disease  RNCM Clinical Goal(s):  Patient will verbalize basic understanding of CHF, COPD, and Pulmonary Disease disease process and self health management plan as evidenced by attending all scheduled appointments, taking medications as prescribed and contacting PCP with any questions or concerns take all medications exactly as prescribed and will call provider for medication related questions as evidenced by refilling medications prior to running out    attend all scheduled medical appointments: with PCP and Specialist as evidenced by physician notes in EMR        work with pharmacist to address medication management related to CHF, COPD, and Pulmonary Disease as evidenced by review of EMR and patient or pharmacist report    work with community resource care guide to address needs related to Social Isolation as evidenced by patient and/or community resource care guide support    through collaboration with Consulting civil engineer, provider, and care team.   Interventions: Inter-disciplinary care team collaboration (see longitudinal plan of care) Evaluation of current treatment plan related to  self management and patient's adherence to plan as established by provider   Heart Failure Interventions:  (Status: Goal  on Track (progressing): YES.)  Long Term Goal  Discussed the importance of keeping all appointments with provider Provided patient with education about the role of exercise in the management of heart failure Assessed  social determinant of health barriers Discussed upcoming appointment with [facility]hedema [facility], patient will call to arrange transportation tomorrow Encouraged patient to take all medications as directed, she is now receiving medications in compliance packaging Provided information on exercises to do while sitting  COPD: (Status: Goal on Track (progressing): YES.) Long Term Goal  Reviewed medications with patient, including use of prescribed maintenance and rescue inhalers, and provided instruction on medication management and the importance of adherence Advised patient to self assesses COPD action plan zone and make appointment with provider if in the yellow zone for 48 hours without improvement Advised patient to engage in light exercise as tolerated 3-5 days a week to aid in the the management of COPD Provided education about and advised patient to utilize infection prevention strategies to reduce risk of respiratory infection Assessed social determinant of health barriers  Patient Goals/Self-Care Activities: call office if I gain more than 2 pounds in one day or 5 pounds in one week keep legs up while sitting track weight in diary use salt in moderation weigh myself daily eat more whole grains, fruits and vegetables, lean meats and healthy fats - avoid second hand smoke - limit outdoor activity during cold weather

## 2020-12-29 ENCOUNTER — Other Ambulatory Visit: Payer: Self-pay | Admitting: Internal Medicine

## 2020-12-29 DIAGNOSIS — I5033 Acute on chronic diastolic (congestive) heart failure: Secondary | ICD-10-CM

## 2021-01-01 ENCOUNTER — Ambulatory Visit: Payer: Medicaid Other | Admitting: Behavioral Health

## 2021-01-01 DIAGNOSIS — F419 Anxiety disorder, unspecified: Secondary | ICD-10-CM

## 2021-01-01 DIAGNOSIS — F331 Major depressive disorder, recurrent, moderate: Secondary | ICD-10-CM

## 2021-01-01 NOTE — BH Specialist Note (Signed)
Integrated Behavioral Health via Telemedicine Visit  01/01/2021 [name] [id number]  Number of [place] visits: 3/6 Session Start time: 1:00pm  Session End time: 1:20pm Total time: 20  Referring Provider: Dr. Jake Shark, DO Patient/Family location: Pt is @ home in private [facility] Provider location: Working remotely All persons participating in visit: Pt & Clinician Types of Service: Individual psychotherapy  I connected with [name] and/or [name]'s  self  via  Telephone or Geologist, engineering  (Video is Tree surgeon) and verified that I am speaking with the correct person using two identifiers. Discussed confidentiality:  3rd visit  I discussed the limitations of telemedicine and the availability of in person appointments.  Discussed there is a possibility of technology failure and discussed alternative modes of communication if that failure occurs.  I discussed that engaging in this telemedicine visit, they consent to the provision of behavioral healthcare and the services will be billed under their insurance.  Patient and/or legal guardian expressed understanding and consented to Telemedicine visit:  3rd visit  Presenting Concerns: Patient and/or family reports the following symptoms/concerns: elevated anx/dep due to inc'd loneliness from isolation due to health intersection w/the flu season & her susceptibility to pneumonia Duration of problem: acutely this Fall season; Severity of problem: moderate  Patient and/or Family's Strengths/Protective Factors: Concrete supports in place (healthy food, safe environments, etc.), Sense of purpose, and Physical Health (exercise, healthy diet, medication compliance, etc.)  Goals Addressed: Patient will:  Reduce symptoms of: anxiety and depression   Increase knowledge and/or ability of: coping skills and stress reduction   Demonstrate ability to: Increase healthy  adjustment to current life circumstances  Progress towards Goals: Ongoing  Interventions: Interventions utilized:  Supportive Counseling Standardized Assessments completed:  screeners prn  Patient and/or Family Response: Pt receptive to call today & requests future appt  Assessment: Patient currently experiencing elevated Sx of loneliness due to her being vigilant about her health & exposure to COVID-19.Marland Kitchen   Patient may benefit from cont'd psychotherapy services for psychological well-being via normalization/validation of her opinions.  Plan: Follow up with behavioral health clinician on : 2-3 wks on telehealth for 30 min ck-in Behavioral recommendations: Keep safe & have a Happy New Year! Referral(s): [facility] (In Clinic)  I discussed the assessment and treatment plan with the patient and/or parent/guardian. They were provided an opportunity to ask questions and all were answered. They agreed with the plan and demonstrated an understanding of the instructions.   They were advised to call back or seek an in-person evaluation if the symptoms worsen or if the condition fails to improve as anticipated.  Donnetta Hutching, LMFT

## 2021-01-02 DIAGNOSIS — G4730 Sleep apnea, unspecified: Secondary | ICD-10-CM | POA: Diagnosis not present

## 2021-01-02 DIAGNOSIS — G4733 Obstructive sleep apnea (adult) (pediatric): Secondary | ICD-10-CM | POA: Diagnosis not present

## 2021-01-02 DIAGNOSIS — I270 Primary pulmonary hypertension: Secondary | ICD-10-CM | POA: Diagnosis not present

## 2021-01-02 DIAGNOSIS — I272 Pulmonary hypertension, unspecified: Secondary | ICD-10-CM | POA: Diagnosis not present

## 2021-01-02 DIAGNOSIS — E119 Type 2 diabetes mellitus without complications: Secondary | ICD-10-CM | POA: Diagnosis not present

## 2021-01-02 DIAGNOSIS — N3946 Mixed incontinence: Secondary | ICD-10-CM | POA: Diagnosis not present

## 2021-01-04 DIAGNOSIS — I5030 Unspecified diastolic (congestive) heart failure: Secondary | ICD-10-CM | POA: Diagnosis not present

## 2021-01-04 DIAGNOSIS — J984 Other disorders of lung: Secondary | ICD-10-CM | POA: Diagnosis not present

## 2021-01-04 DIAGNOSIS — G4730 Sleep apnea, unspecified: Secondary | ICD-10-CM | POA: Diagnosis not present

## 2021-01-04 DIAGNOSIS — I270 Primary pulmonary hypertension: Secondary | ICD-10-CM | POA: Diagnosis not present

## 2021-01-07 ENCOUNTER — Ambulatory Visit (HOSPITAL_COMMUNITY): Payer: Medicaid Other | Attending: Internal Medicine | Admitting: Physical Therapy

## 2021-01-07 ENCOUNTER — Other Ambulatory Visit: Payer: Self-pay

## 2021-01-07 DIAGNOSIS — I890 Lymphedema, not elsewhere classified: Secondary | ICD-10-CM | POA: Insufficient documentation

## 2021-01-07 NOTE — Therapy (Signed)
[facility] [address] [place], [place], [address] Phone: [phone]   Fax:  [phone]  Physical Therapy Evaluation  Patient Details  Name: [name] MRN: [id number] Date of Birth: [birth year] Referring Provider (PT): Georjean Mode   Encounter Date: 01/07/2021   PT End of Session - 01/07/21 1123     Visit Number 1    Number of Visits 18    Date for PT Re-Evaluation 03/13/21   Pt will not be able to begin treatment until 1/23   Authorization Type Healthy blue requesed    PT Start Time 0945    PT Stop Time 1100    PT Time Calculation (min) 75 min    Activity Tolerance Patient tolerated treatment well    Behavior During Therapy [facility] for tasks assessed/performed             Past Medical History:  Diagnosis Date   Acute on chronic diastolic (congestive) heart failure ([place]) 06/25/2013   Acute on chronic respiratory failure ([place]) 06/15/2018   Anemia, iron deficiency    secondary to menhorrhagia, on oral iron, also b12 def, getting monthly b12 shots   Cellulitis 05/2019   RIGHT LOWER EXTREMITY   CHF (congestive heart failure) (HCC)    Chronic cough    secondary to alleriges and post nasal drip   Cognitive impairment    COPD (chronic obstructive pulmonary disease) ([place])    Cor pulmonale (HCC)    PA Peak pressure 96mHg   Depression    Diabetes mellitus    well controlled on metformin   Diastolic heart failure (HCC)    GERD (gastroesophageal reflux disease)    H/O mental retardation    Herpes    Hyperlipidemia    Hypertension    OSA (obstructive sleep apnea)    CPAP   Pulmonary hypertension ([place]    Renal disorder    Restrictive lung disease    PFTs 06/2012 (FVC 54% predicted and FEV1 68% predicted w minimal bronchodilator response).   Shortness of breath    Venous stasis ulcer ([place]    chornic, ?followed up at wound care center, multiple courses of antibiotics in past for cellulitis, on lasix    Past Surgical  History:  Procedure Laterality Date   CARDIAC CATHETERIZATION N/A 01/13/2016   Procedure: Right Heart Cath;  Surgeon: DJolaine Artist MD;  [facility];  Service: Cardiovascular;  Laterality: N/A;   CHOLECYSTECTOMY     COLONOSCOPY WITH PROPOFOL N/A 12/20/2019   Procedure: COLONOSCOPY WITH PROPOFOL;  Surgeon: JMilus Banister MD;  Location: [facility] ENDOS[facility];  Service: Endoscopy;  Laterality: N/A;   IR FLUORO GUIDE CV LINE LEFT  11/16/2019   IR FLUORO GUIDE CV LINE LEFT  06/17/2020   IR FLUORO GUIDE CV LINE LEFT  07/04/2020   IR FLUORO GUIDE CV LINE RIGHT  05/09/2019   IR FLUORO GUIDE CV LINE RIGHT  08/12/2019   IR REMOVAL TUN CV CATH W/O FL  05/05/2019   IR [place] VASC ACCESS LEFT  11/16/2019   IR [place] VASC ACCESS LEFT  06/17/2020   IR [place] VASC ACCESS LEFT  07/04/2020   IR [place] VASC ACCESS RIGHT  05/09/2019   IR [place] VASC ACCESS RIGHT  08/12/2019   IR [place] VASC ACCESS RIGHT  11/16/2019   IR VENO/JUGULAR RIGHT  11/16/2019   LEFT AND RIGHT HEART CATHETERIZATION WITH CORONARY ANGIOGRAM N/A 02/28/2014   Procedure: LEFT AND RIGHT HEART  CATHETERIZATION WITH CORONARY ANGIOGRAM;  Surgeon: Jolaine Artist, MD;  Location: [facility] C[facility] LAB;  Service: Cardiovascular;  Laterality: N/A;   RIGHT HEART CATH N/A 06/27/2018   Procedure: RIGHT HEART CATH;  Surgeon: Jolaine Artist, MD;  Location: [facility] CV LAB;  Service: Cardiovascular;  Laterality: N/A;   RIGHT/LEFT HEART CATH AND CORONARY ANGIOGRAPHY N/A 05/14/2019   Procedure: RIGHT/LEFT HEART CATH AND CORONARY ANGIOGRAPHY;  Surgeon: Jolaine Artist, MD;  Location: [facility] CV LAB;  Service: Cardiovascular;  Laterality: N/A;    There were no vitals filed for this visit.    Subjective Assessment - 01/07/21 0950     Subjective PT states that she has had swelling in her legs for several years.  The swelling started on her left leg first and then it went to her right.  She had compression stockings but she had them take  them off after ten seconds as it felt that it was cutting off the circulation in her legs.  They did a test to make sure that she had good circulation in her legs which was negative so she does not know why she could not tolerate them.  She gets periodic blisters on her legs which pop and heal back up leaving scars.    Pertinent History Htn, CHF, COPD, OA, RT LE cellulitis, obesity.    Limitations Lifting;Standing;Walking;House hold activities   not only due to lymphedema but CHF and COPD   How long can you sit comfortably? no problem    How long can you stand comfortably? less than five minutes    How long can you walk comfortably? Walks with a rollator ambulates for less than 65 feet at a time.    Patient Stated Goals legs to be smaller    Currently in Pain? No/denies                [facility] PT Assessment - 01/07/21 0001       Assessment   Medical Diagnosis B LE lymphedema    Referring Provider (PT) Georjean Mode    Onset Date/Surgical Date --   chronic   Next MD Visit unknown    Prior Therapy none      Precautions   Precautions --   cellulitis     Restrictions   Weight Bearing Restrictions No      Balance Screen   Has the patient fallen in the past 6 months No    Has the patient had a decrease in activity level because of a fear of falling?  Yes    Is the patient reluctant to leave their home because of a fear of falling?  Yes      Prior Function   Level of Independence Independent with household mobility with device    Vocation Unemployed      Cognition   Overall Cognitive Status Within Functional Limits for tasks assessed      Observation/Other Assessments   Skin Integrity poor dry and noted discoloration B      Observation/Other Assessments-Edema    Edema --   hyperpigmentation B, hyperkeratosis B     Palpation   Palpation comment noted induration in B LE               LYMPHEDEMA/ONCOLOGY QUESTIONNAIRE - 01/07/21 0001       What other symptoms do  you have   Are you Having Heaviness or Tightness Yes    Are you having Pain No    Are you  having pitting edema No    Is it Hard or Difficult finding clothes that fit Yes    Do you have infections No   Has had history of cellulitis in her RT LE   Is there Decreased scar mobility Yes    Stemmer Sign Yes      Lymphedema Stage   Stage STAGE 2 SPONTANEOUSLY IRREVERSIBLE      Lymphedema Assessments   Lymphedema Assessments Lower extremities      Right Lower Extremity Lymphedema   20 cm Proximal to Suprapatella 70.3 cm    10 cm Proximal to Suprapatella 68.5 cm    At Midpatella/Popliteal Crease 54.5 cm    30 cm Proximal to Floor at Lateral Plantar Foot 60.2 cm    20 cm Proximal to Floor at Lateral Plantar Foot 46._0 cm Proximal to Floor at Lateral Malleoli 33.5 cm    Circumference of ankle/heel 37 cm.    5 cm Proximal to 1st MTP Joint 27.5 cm    Across MTP Joint 25.5 cm      Left Lower Extremity Lymphedema   30 cm Proximal to Suprapatella 68.2 cm    20 cm Proximal to Suprapatella 68.2 cm    10 cm Proximal to Suprapatella 67 cm    At Midpatella/Popliteal Crease 51 cm    30 cm Proximal to Floor at Lateral Plantar Foot 62.7 cm    20 cm Proximal to Floor at Lateral Plantar Foot 44.2 cm    10 cm Proximal to Floor at Lateral Malleoli 34 cm    Circumference of ankle/heel 36.6 cm.    5 cm Proximal to 1st MTP Joint 27.8 cm    Across MTP Joint 26.6 cm                     Objective measurements completed on examination: See above findings.       [place] Adult PT Treatment/Exercise - 01/07/21 0001       Exercises   Exercises Knee/Hip   sitting: ankle pumps, LAQ, hip ab/adduction, marching,diaphragmic breathing and lymph squeeze     Manual Therapy   Manual Therapy Manual Lymphatic Drainage (MLD);Compression Bandaging    Manual therapy comments completed seperate from all other aspects of treatment                     PT Education - 01/07/21 1122      Education Details What lymphedema is, the four aspects to controlling lymphedema and LE exercises    Person(s) Educated Patient    Methods Explanation;Demonstration;Handout    Comprehension Verbalized understanding              PT Short Term Goals - 01/07/21 1125       PT SHORT TERM GOAL #1   Title Pt to be I in HEP to assist in increasing the lymphatic circulation    Time 2    Period Weeks    Status New    Target Date 01/21/21      PT SHORT TERM GOAL #2   Title PT to be completing good skin care on a daily basis    Time 2    Period Weeks    Status New      PT SHORT TERM GOAL #3   Title PT to have lost 2-3 cm of fluid from B LE to reduce risk of cellulitis    Time 6    Period Weeks  Status New   Pt will not be starting treatment for 3 weeks   Target Date 02/13/21               PT Long Term Goals - 01/07/21 1127       PT LONG TERM GOAL #1   Title Pt to have lost 5-6  cm from B LE to reduce risk of cellulitis and to be able to fit into shoes and pants    Time 10    Period Weeks    Status New    Target Date 03/06/21      PT LONG TERM GOAL #2   Title PT to have and be using a compression pump    Time 10    Period Weeks    Status New      PT LONG TERM GOAL #3   Title PT to have and to be able to don juxtafit    Time 10    Period Weeks    Status New                    Plan - 01/07/21 1001     Clinical Impression Statement Ms. Harkless is a 61 yo female who has had swelling in B LE for approximately 15 years.  She tried compression garments but was unable to tolerate them, she has never had a compression pump.  She has had cellulitis in her legs approximately a year ago.  She is currently being referred to skilled PT for LE lymphedema.  Ms. Posa will benefit from skilled PT for total decongestive techniques and to assist pt to aquire juxtafit as well as a compression pump to keep her edema at a minimum to reduce the risk of cellulitis.     Personal Factors and Comorbidities Comorbidity 3+;Fitness;Time since onset of injury/illness/exacerbation    Comorbidities HTN, obesity, CHF, COPD, OA, Hx of cellulitis    Examination-Activity Limitations Dressing;Hygiene/Grooming;Locomotion Level;Squat;Stairs    Examination-Participation Restrictions Cleaning;Community Activity;Meal Prep    Stability/Clinical Decision Making Evolving/Moderate complexity    Clinical Decision Making Moderate    Rehab Potential Good    PT Frequency 3x / week    PT Duration 6 weeks    PT Treatment/Interventions Therapeutic exercise;Manual lymph drainage;Compression bandaging    PT Next Visit Plan begin total decongestive techniques.    PT Home Exercise Plan sitting: ankle pumps, LAQ, hip ab/adduction, marching,diaphragmic breathing and lymph squeeze             Patient will benefit from skilled therapeutic intervention in order to improve the following deficits and impairments:  Obesity, Decreased scar mobility, Increased edema, Decreased skin integrity  Visit Diagnosis: Lymphedema, not elsewhere classified     Problem List Patient Active Problem List   Diagnosis Date Noted   Knee osteoarthritis 12/12/2020   Vision changes 09/02/2020   Depression 09/02/2020   Swelling of lower extremity 08/13/2020   Left paraspinal back pain 05/05/2020   Hearing difficulty of both ears 05/05/2020   Internal hemorrhoid 01/11/2020   External hemorrhoid 01/11/2020   Fibroid, uterine 11/07/2019   Vagina bleeding 09/44/6155   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   Bleeding from the genitourinary system 09/13/2019   Gross hematuria 09/13/2019   Chronic venous stasis 05/07/2019   Urinary incontinence 12/12/2018   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[place])    Right ovarian cyst 05/19/2017   Peripheral neuropathy ([place]) 09/27/2014   Healthcare maintenance 09/27/2014   Atrial flutter ([place]) 08/17/2012  Right heart failure, NYHA class 3  ([place]) 03/15/2012   Allergic rhinitis 11/16/2011   Diabetes ([place]) 10/15/2010   Hyperlipidemia 10/15/2010   Obesity 10/14/2010   Obstructive sleep apnea 02/21/2009   GERD 11/29/2005   Rayetta Humphrey, PT CLT [phone]  01/07/2021, 11:37 AM  [facility] [address] [place], [place], [address] Phone: [phone]   Fax:  [phone]  Name: LYAN HOLCK MRN: [id number] Date of Birth: 06-17-1960

## 2021-01-08 ENCOUNTER — Other Ambulatory Visit: Payer: Self-pay | Admitting: *Deleted

## 2021-01-08 NOTE — Patient Instructions (Signed)
Visit Information  [name] was given information about Medicaid Managed Care team care coordination services as a part of their Healthy [facility] Medicaid benefit. [name] verbally consented to engagement with the [facility] Managed Care team.   If you are experiencing a medical emergency, please call 911 or report to your local emergency department or urgent care.   If you have a non-emergency medical problem during routine business hours, please contact your provider's office and ask to speak with a nurse.   For questions related to your [facility] health plan, please call: [phone] or visit the homepage here: [email or web address]  If you would like to schedule transportation through your Healthy [facility] plan, please call the following number at least 2 days in advance of your appointment: [phone]  Call the [place] at [phone], at any time, 24 hours a day, 7 days a week. If you are in danger or need immediate medical attention call 911.  If you would like help to quit smoking, call 1-800-QUIT-NOW 916-606-0216) OR Espaol: 1-855-Djelo-Ya (8-889-169-4503) o para ms informacin haga clic aqu or Text READY to 200-400 to register via text  [name],   Please see education materials related to low sodium diet and pulmonary HTN provided as print materials.   The patient verbalized understanding of instructions provided today and agreed to receive a mailed copy of patient instruction and/or educational materials.  Telephone follow up appointment with Managed Medicaid care management team member scheduled for:02/10/21 @ 2:30pm  Faith Joiner RN, BSN [place] RN Care Coordinator   Following is a copy of your plan of care:  Care Plan : RN Care Manager Plan of Care  Updates made by Melissa Montane, RN since 01/08/2021 12:00 AM     Problem: Chronic Disease Management  needs in patient with CHF, COPD and Pulmonary HTN   Priority: High     Long-Range Goal: Development of Plan of Care for Chronic Disease Management needs in patient with CHF, COPD and Pulmonary HTN   Start Date: 11/13/2020  Expected End Date: 02/11/2021  Priority: High  Note:   Current Barriers:  Chronic Disease Management support and education needs related to CHF, COPD, and Pulmonary Disease Ms. [place] attending Lymph clinic for the first time this week. She has exercises and skin care to work on until her next visit on 01/26/21. She will arrange transportation to her appointments. She has all of her medications and is taking as instructed.   RNCM Clinical Goal(s):  Patient will verbalize basic understanding of CHF, COPD, and Pulmonary Disease disease process and self health management plan as evidenced by attending all scheduled appointments, taking medications as prescribed and contacting PCP with any questions or concerns take all medications exactly as prescribed and will call provider for medication related questions as evidenced by refilling medications prior to running out    attend all scheduled medical appointments: with PCP and Specialist as evidenced by physician notes in EMR        work with pharmacist to address medication management related to CHF, COPD, and Pulmonary Disease as evidenced by review of EMR and patient or pharmacist report    work with community resource care guide to address needs related to Social Isolation as evidenced by patient and/or community resource care guide support    through collaboration with Consulting civil engineer, provider, and care team.   Interventions: Inter-disciplinary care team collaboration (see longitudinal plan of care) Evaluation  of current treatment plan related to  self management and patient's adherence to plan as established by provider   Heart Failure Interventions:  (Status: Goal on Track (progressing): YES.)  Long Term Goal  Discussed the  importance of keeping all appointments with provider Provided patient with education about the role of exercise in the management of heart failure Assessed social determinant of health barriers Discussed upcoming appointments with [facility] Clinic, patient will call to arrange transportation tomorrow Discussed recent visit with [facility], [name] has exercises to work on and was instructed to moisturize her skin daily Provided information on exercises to do while sitting  COPD: (Status: Goal on Track (progressing): YES.) Long Term Goal  Reviewed medications with patient, including use of prescribed maintenance and rescue inhalers, and provided instruction on medication management and the importance of adherence Advised patient to track and manage COPD triggers Advised patient to engage in light exercise as tolerated 3-5 days a week to aid in the the management of COPD Provided education about and advised patient to utilize infection prevention strategies to reduce risk of respiratory infection Assessed social determinant of health barriers  Patient Goals/Self-Care Activities: call office if I gain more than 2 pounds in one day or 5 pounds in one week keep legs up while sitting track weight in diary use salt in moderation weigh myself daily eat more whole grains, fruits and vegetables, lean meats and healthy fats - avoid second hand smoke - limit outdoor activity during cold weather

## 2021-01-08 NOTE — Patient Outreach (Addendum)
Medicaid Managed Care   Nurse Care Manager Note  01/08/2021 Name:  KENYANA HUSAK MRN:  [id number] DOB:  09-14-1960  [name] is an 61 y.o. year old female who is a primary patient of Masters, [name], DO.  The [facility] Managed Care Coordination team was consulted for assistance with:    CHF COPD  Ms. Carboni was given information about Medicaid Managed Care Coordination team services today. [name] Patient agreed to services and verbal consent obtained.  Engaged with patient by telephone for follow up visit in response to provider referral for case management and/or care coordination services.   Assessments/Interventions:  Review of past medical history, allergies, medications, health status, including review of consultants reports, laboratory and other test data, was performed as part of comprehensive evaluation and provision of chronic care management services.  SDOH (Social Determinants of Health) assessments and interventions performed: SDOH Interventions    Flowsheet Row Most Recent Value  SDOH Interventions   Food Insecurity Interventions Intervention Not Indicated  Housing Interventions Intervention Not Indicated       Care Plan  Allergies  Allergen Reactions   Aspirin Swelling    REACTION: airway swelling   Codeine Other (See Comments)    REACTION: tingling in lips and hard breathing - had reaction at dentist - states "I can't take certain kinds of codeine" - happened maybe 10 yr ago   Lisinopril Cough   Sulfonamide Derivatives Swelling    REACTION: airway swelling   Latex Rash    Medications Reviewed Today     RNCM unable to review medications today. Patient's phone kept cutting out during the conversation.        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[place])    Right ovarian cyst 05/19/2017   Peripheral neuropathy ([place]) 09/27/2014   Healthcare maintenance 09/27/2014   Atrial flutter ([place]) 08/17/2012   Right heart failure, NYHA class 3 ([place]) 03/15/2012   Allergic rhinitis 11/16/2011   Diabetes ([place]) 10/15/2010   Hyperlipidemia 10/15/2010   Obesity 10/14/2010   Obstructive sleep apnea 02/21/2009   GERD 11/29/2005    Conditions to be addressed/monitored per PCP order:  CHF and COPD  Care Plan : RN Care Manager Plan of Care  Updates made by Melissa Montane, RN since 01/08/2021 12:00 AM     Problem: Chronic Disease Management needs in patient with CHF, COPD and Pulmonary HTN   Priority: High     Long-Range Goal: Development of Plan of Care for Chronic Disease Management needs in patient with CHF, COPD and Pulmonary HTN   Start Date: 11/13/2020  Expected End Date: 02/11/2021  Priority: High  Note:   Current Barriers:  Chronic Disease Management support and education needs related to CHF, COPD, and Pulmonary Disease Ms. [place] attending Lymph clinic for the first time this week. She has exercises and skin care to work on until her next visit on 01/26/21. She will  arrange transportation to her appointments. She has all of her medications and is taking as instructed.   RNCM Clinical Goal(s):  Patient will verbalize basic understanding of CHF, COPD, and Pulmonary Disease disease process and self health management plan as evidenced by attending all scheduled appointments, taking medications as prescribed and contacting PCP with any  questions or concerns take all medications exactly as prescribed and will call provider for medication related questions as evidenced by refilling medications prior to running out    attend all scheduled medical appointments: with PCP and Specialist as evidenced by physician notes in EMR        work with pharmacist to address medication management related to CHF, COPD, and Pulmonary Disease as evidenced by review of EMR and patient or pharmacist report    work with community resource care guide to address needs related to Social Isolation as evidenced by patient and/or community resource care guide support    through collaboration with Consulting civil engineer, provider, and care team.   Interventions: Inter-disciplinary care team collaboration (see longitudinal plan of care) Evaluation of current treatment plan related to  self management and patient's adherence to plan as established by provider   Heart Failure Interventions:  (Status: Goal on Track (progressing): YES.)  Long Term Goal  Discussed the importance of keeping all appointments with provider Provided patient with education about the role of exercise in the management of heart failure Assessed social determinant of health barriers Discussed upcoming appointments with Lymphedema Clinic, patient will call to arrange transportation tomorrow Discussed recent visit with [facility], Ms. [place] has exercises to work on and was instructed to moisturize her skin daily Provided information on exercises to do while sitting  COPD: (Status: Goal on Track (progressing): YES.) Long Term Goal  Reviewed medications with patient, including use of prescribed maintenance and rescue inhalers, and provided instruction on medication management and the importance of adherence Advised patient to track and manage COPD triggers Advised patient to engage in light exercise as tolerated 3-5 days a week to aid in the the management of COPD Provided education about and  advised patient to utilize infection prevention strategies to reduce risk of respiratory infection Assessed social determinant of health barriers  Patient Goals/Self-Care Activities: call office if I gain more than 2 pounds in one day or 5 pounds in one week keep legs up while sitting track weight in diary use salt in moderation weigh myself daily eat more whole grains, fruits and vegetables, lean meats and healthy fats - avoid second hand smoke - limit outdoor activity during cold weather       Follow Up:  Patient agrees to Care Plan and Follow-up.  Plan: The Managed Medicaid care management team will reach out to the patient again over the next 30 days.  Date/time of next scheduled RN care management/care coordination outreach:  02/10/21 @ 2:30pm  [name] RN, BSN [place] RN Care Coordinator

## 2021-01-09 ENCOUNTER — Encounter (HOSPITAL_COMMUNITY): Payer: Medicaid Other | Admitting: Physical Therapy

## 2021-01-12 ENCOUNTER — Encounter (HOSPITAL_COMMUNITY): Payer: Medicaid Other | Admitting: Physical Therapy

## 2021-01-13 ENCOUNTER — Other Ambulatory Visit: Payer: Self-pay | Admitting: Internal Medicine

## 2021-01-13 DIAGNOSIS — G629 Polyneuropathy, unspecified: Secondary | ICD-10-CM

## 2021-01-14 ENCOUNTER — Encounter (HOSPITAL_COMMUNITY): Payer: Medicaid Other | Admitting: Physical Therapy

## 2021-01-16 ENCOUNTER — Encounter (HOSPITAL_COMMUNITY): Payer: Medicaid Other | Admitting: Physical Therapy

## 2021-01-19 ENCOUNTER — Encounter (HOSPITAL_COMMUNITY): Payer: Medicaid Other | Admitting: Physical Therapy

## 2021-01-20 ENCOUNTER — Ambulatory Visit: Payer: Self-pay

## 2021-01-21 ENCOUNTER — Encounter (HOSPITAL_COMMUNITY): Payer: Medicaid Other

## 2021-01-23 ENCOUNTER — Encounter (HOSPITAL_COMMUNITY): Payer: Medicaid Other | Admitting: Physical Therapy

## 2021-01-26 ENCOUNTER — Other Ambulatory Visit: Payer: Self-pay

## 2021-01-26 ENCOUNTER — Ambulatory Visit (HOSPITAL_COMMUNITY): Payer: Medicaid Other | Admitting: Physical Therapy

## 2021-01-26 DIAGNOSIS — I890 Lymphedema, not elsewhere classified: Secondary | ICD-10-CM | POA: Diagnosis not present

## 2021-01-26 NOTE — Therapy (Signed)
[facility] [address] [place], [place], [address] Phone: [phone]   Fax:  [phone]  Physical Therapy Treatment  Patient Details  Name: [name] MRN: [id number] Date of Birth: 1961-01-03 Referring Provider (PT): Georjean Mode   Encounter Date: 01/26/2021   PT End of Session - 01/26/21 1304     Visit Number 2    Number of Visits 18    Date for PT Re-Evaluation 03/13/21   Pt will not be able to begin treatment until 1/23   Authorization Type Healthy blue requesed    PT Start Time 1050    PT Stop Time 1153    PT Time Calculation (min) 63 min    Activity Tolerance Patient tolerated treatment well    Behavior During Therapy [facility] for tasks assessed/performed             Past Medical History:  Diagnosis Date   Acute on chronic diastolic (congestive) heart failure ([place]) 06/25/2013   Acute on chronic respiratory failure ([place]) 06/15/2018   Anemia, iron deficiency    secondary to menhorrhagia, on oral iron, also b12 def, getting monthly b12 shots   Cellulitis 05/2019   RIGHT LOWER EXTREMITY   CHF (congestive heart failure) (HCC)    Chronic cough    secondary to alleriges and post nasal drip   Cognitive impairment    COPD (chronic obstructive pulmonary disease) ([place])    Cor pulmonale (HCC)    PA Peak pressure 19mHg   Depression    Diabetes mellitus    well controlled on metformin   Diastolic heart failure (HCC)    GERD (gastroesophageal reflux disease)    H/O mental retardation    Herpes    Hyperlipidemia    Hypertension    OSA (obstructive sleep apnea)    CPAP   Pulmonary hypertension ([place]    Renal disorder    Restrictive lung disease    PFTs 06/2012 (FVC 54% predicted and FEV1 68% predicted w minimal bronchodilator response).   Shortness of breath    Venous stasis ulcer ([place]    chornic, ?followed up at wound care center, multiple courses of antibiotics in past for cellulitis, on lasix    Past Surgical  History:  Procedure Laterality Date   CARDIAC CATHETERIZATION N/A 01/13/2016   Procedure: Right Heart Cath;  Surgeon: DJolaine Artist MD;  [facility]: [facility];  Service: Cardiovascular;  Laterality: N/A;   CHOLECYSTECTOMY     COLONOSCOPY WITH PROPOFOL N/A 12/20/2019   Procedure: COLONOSCOPY WITH PROPOFOL;  Surgeon: JMilus Banister MD;  Location: [facility];  Service: Endoscopy;  Laterality: N/A;   IR FLUORO GUIDE CV LINE LEFT  11/16/2019   IR FLUORO GUIDE CV LINE LEFT  06/17/2020   IR FLUORO GUIDE CV LINE LEFT  07/04/2020   IR FLUORO GUIDE CV LINE RIGHT  05/09/2019   IR FLUORO GUIDE CV LINE RIGHT  08/12/2019   IR REMOVAL TUN CV CATH W/O FL  05/05/2019   IR [place]ASC ACCESS LEFT  11/16/2019   IR [place] VASC ACCESS LEFT  06/17/2020   IR [place] VASC ACCESS LEFT  07/04/2020   IR [place] VASC ACCESS RIGHT  05/09/2019   IR [place] VASC ACCESS RIGHT  08/12/2019   IR [place] VASC ACCESS RIGHT  11/16/2019   IR VENO/JUGULAR RIGHT  11/16/2019   LEFT AND RIGHT HEART CATHETERIZATION WITH CORONARY ANGIOGRAM N/A 02/28/2014   Procedure: LEFT AND RIGHT HEART  CATHETERIZATION WITH CORONARY ANGIOGRAM;  Surgeon: Jolaine Artist, MD;  Location: [facility] LAB;  Service: Cardiovascular;  Laterality: N/A;   RIGHT HEART CATH N/A 06/27/2018   Procedure: RIGHT HEART CATH;  Surgeon: Jolaine Artist, MD;  Location: [facility] CV LAB;  Service: Cardiovascular;  Laterality: N/A;   RIGHT/LEFT HEART CATH AND CORONARY ANGIOGRAPHY N/A 05/14/2019   Procedure: RIGHT/LEFT HEART CATH AND CORONARY ANGIOGRAPHY;  Surgeon: Jolaine Artist, MD;  Location: [facility] CV LAB;  Service: Cardiovascular;  Laterality: N/A;    There were no vitals filed for this visit.   Subjective Assessment - 01/26/21 1158     Subjective pt states she did not order her bandaging as she was told to wait to see if grant becomes approved.  states transportation brings her and she lives in a duplex apt beside her son.    Currently in  Pain? No/denies                               [facility] Adult PT Treatment/Exercise - 01/26/21 0001       Manual Therapy   Manual Therapy Manual Lymphatic Drainage (MLD);Compression Bandaging    Manual therapy comments completed seperate from all other aspects of treatment    Manual Lymphatic Drainage (MLD) supine only due to breathing; bil inguinal-axillary anastomosis including short neck, deep and superficial abdominal nodes to bil LE's    Compression Bandaging to distal bil LE's using profore system                     PT Education - 01/26/21 1201     Education Details to schedule appt with primary to get podiatrist appt for nail care and to see about diabetic shoes.  Educated on wearing footwear and keeping feet clean and LE's moisturized.  Instructed to leave bandaging on until returns on Wednesday and therapist would removed.  Educated if she had increased pain and had to remove her bandaging to save th foam.    Person(s) Educated Patient    Methods Explanation    Comprehension Verbalized understanding              PT Short Term Goals - 01/07/21 1125       PT SHORT TERM GOAL #1   Title Pt to be I in HEP to assist in increasing the lymphatic circulation    Time 2    Period Weeks    Status New    Target Date 01/21/21      PT SHORT TERM GOAL #2   Title PT to be completing good skin care on a daily basis    Time 2    Period Weeks    Status New      PT SHORT TERM GOAL #3   Title PT to have lost 2-3 cm of fluid from B LE to reduce risk of cellulitis    Time 6    Period Weeks    Status New   Pt will not be starting treatment for 3 weeks   Target Date 02/13/21               PT Long Term Goals - 01/07/21 1127       PT LONG TERM GOAL #1   Title Pt to have lost 5-6  cm from B LE to reduce risk of cellulitis and to be able to fit into shoes and pants  Time 10    Period Weeks    Status New    Target Date 03/06/21      PT LONG  TERM GOAL #2   Title PT to have and be using a compression pump    Time 10    Period Weeks    Status New      PT LONG TERM GOAL #3   Title PT to have and to be able to don juxtafit    Time 10    Period Weeks    Status New                   Plan - 01/26/21 1305     Clinical Impression Statement Pt has not looked into getting bandages and was informed we should know about the grant by end of week and would let her know if she needed to order them.  Initiated manual lymph drainage for bilateral LEs.  completed only in supine this session due to CHF and trouble breathing.  Educated on purpose of manual in conjunction with compression.  Informed we would use profore until short stretch was available. Instructed to schedule appt with primary to get podiatrist appt for nail care and to see about diabetic shoes. Also noted plantar surfaces of feet were dirty/soiled.   Educated on wearing footwear and keeping feet clean and LE's moisturized.    Personal Factors and Comorbidities Comorbidity 3+;Fitness;Time since onset of injury/illness/exacerbation    Comorbidities HTN, obesity, CHF, COPD, OA, Hx of cellulitis    Examination-Activity Limitations Dressing;Hygiene/Grooming;Locomotion Level;Squat;Stairs    Examination-Participation Restrictions Cleaning;Community Activity;Meal Prep    Stability/Clinical Decision Making Evolving/Moderate complexity    Rehab Potential Good    PT Frequency 3x / week    PT Duration 6 weeks    PT Treatment/Interventions Therapeutic exercise;Manual lymph drainage;Compression bandaging    PT Next Visit Plan Continue total decongestive techniques.  continue with profore system using foam until short stretch avalable.  F/U with making appt with podiatrist.    PT Home Exercise Plan sitting: ankle pumps, LAQ, hip ab/adduction, marching,diaphragmic breathing and lymph squeeze             Patient will benefit from skilled therapeutic intervention in order to  improve the following deficits and impairments:  Obesity, Decreased scar mobility, Increased edema, Decreased skin integrity  Visit Diagnosis: Lymphedema, not elsewhere classified     Problem List Patient Active Problem List   Diagnosis Date Noted   Knee osteoarthritis 12/12/2020   Vision changes 09/02/2020   Depression 09/02/2020   Swelling of lower extremity 08/13/2020   Left paraspinal back pain 05/05/2020   Hearing difficulty of both ears 05/05/2020   Internal hemorrhoid 01/11/2020   External hemorrhoid 01/11/2020   Fibroid, uterine 11/07/2019   Vagina bleeding 60/63/0160   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   Bleeding from the genitourinary system 09/13/2019   Gross hematuria 09/13/2019   Chronic venous stasis 05/07/2019   Urinary incontinence 12/12/2018   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[place])    Right ovarian cyst 05/19/2017   Peripheral neuropathy ([place]) 09/27/2014   Healthcare maintenance 09/27/2014   Atrial flutter ([place]) 08/17/2012   Right heart failure, NYHA class 3 ([place]) 03/15/2012   Allergic rhinitis 11/16/2011   Diabetes ([place]) 10/15/2010   Hyperlipidemia 10/15/2010   Obesity 10/14/2010   Obstructive sleep apnea 02/21/2009   GERD 11/29/2005   Teena Irani, PTA/CLT, WTA [phone]  Teena Irani, PTA 01/26/2021, 1:07 PM  [facility] [address] [place], [place], [address] Phone: [phone]   Fax:  [phone]  Name: JESSICA CHECKETTS MRN: [id number] Date of Birth: [birth year]

## 2021-01-27 ENCOUNTER — Ambulatory Visit: Payer: Medicaid Other | Admitting: Behavioral Health

## 2021-01-27 ENCOUNTER — Other Ambulatory Visit: Payer: Self-pay | Admitting: Internal Medicine

## 2021-01-27 DIAGNOSIS — G629 Polyneuropathy, unspecified: Secondary | ICD-10-CM

## 2021-01-28 ENCOUNTER — Ambulatory Visit (HOSPITAL_COMMUNITY): Payer: Medicaid Other | Admitting: Physical Therapy

## 2021-01-28 ENCOUNTER — Other Ambulatory Visit: Payer: Self-pay

## 2021-01-28 DIAGNOSIS — I890 Lymphedema, not elsewhere classified: Secondary | ICD-10-CM

## 2021-01-28 NOTE — Therapy (Signed)
[place] [address] [place], [place], [address] Phone: [phone]   Fax:  [phone]  Physical Therapy Treatment  Patient Details  Name: [name] MRN: [id number] Date of Birth: Nov 08, 1960 Referring Provider (PT): Georjean Mode   Encounter Date: 01/28/2021   PT End of Session - 01/28/21 1152     Visit Number 3    Number of Visits 18    Date for PT Re-Evaluation 03/13/21   Pt will not be able to begin treatment until 1/23   Authorization Type Healthy blue requesed    PT Start Time 0940    PT Stop Time 1110    PT Time Calculation (min) 90 min    Activity Tolerance Patient tolerated treatment well    Behavior During Therapy [facility] for tasks assessed/performed             Past Medical History:  Diagnosis Date   Acute on chronic diastolic (congestive) heart failure ([place]) 06/25/2013   Acute on chronic respiratory failure ([place]) 06/15/2018   Anemia, iron deficiency    secondary to menhorrhagia, on oral iron, also b12 def, getting monthly b12 shots   Cellulitis 05/2019   RIGHT LOWER EXTREMITY   CHF (congestive heart failure) (HCC)    Chronic cough    secondary to alleriges and post nasal drip   Cognitive impairment    COPD (chronic obstructive pulmonary disease) ([place])    Cor pulmonale (HCC)    PA Peak pressure 53mHg   Depression    Diabetes mellitus    well controlled on metformin   Diastolic heart failure (HCC)    GERD (gastroesophageal reflux disease)    H/O mental retardation    Herpes    Hyperlipidemia    Hypertension    OSA (obstructive sleep apnea)    CPAP   Pulmonary hypertension ([place]    Renal disorder    Restrictive lung disease    PFTs 06/2012 (FVC 54% predicted and FEV1 68% predicted w minimal bronchodilator response).   Shortness of breath    Venous stasis ulcer ([place]    chornic, ?followed up at wound care center, multiple courses of antibiotics in past for cellulitis, on lasix    Past Surgical  History:  Procedure Laterality Date   CARDIAC CATHETERIZATION N/A 01/13/2016   Procedure: Right Heart Cath;  Surgeon: DJolaine Artist MD;  [facility];  Service: Cardiovascular;  Laterality: N/A;   CHOLECYSTECTOMY     COLONOSCOPY WITH PROPOFOL N/A 12/20/2019   Procedure: COLONOSCOPY WITH PROPOFOL;  Surgeon: JMilus Banister MD;  [facility]: [facility];  Service: Endoscopy;  Laterality: N/A;   IR FLUORO GUIDE CV LINE LEFT  11/16/2019   IR FLUORO GUIDE CV LINE LEFT  06/17/2020   IR FLUORO GUIDE CV LINE LEFT  07/04/2020   IR FLUORO GUIDE CV LINE RIGHT  05/09/2019   IR FLUORO GUIDE CV LINE RIGHT  08/12/2019   IR REMOVAL TUN CV CATH W/O FL  05/05/2019   IR [place] VASC ACCESS LEFT  11/16/2019   IR [place] VASC ACCESS LEFT  06/17/2020   IR [place] VASC ACCESS LEFT  07/04/2020   IR [place] VASC ACCESS RIGHT  05/09/2019   IR [place] VASC ACCESS RIGHT  08/12/2019   IR [place] VASC ACCESS RIGHT  11/16/2019   IR VENO/JUGULAR RIGHT  11/16/2019   LEFT AND RIGHT HEART CATHETERIZATION WITH CORONARY ANGIOGRAM N/A 02/28/2014   Procedure: LEFT AND RIGHT HEART  CATHETERIZATION WITH CORONARY ANGIOGRAM;  Surgeon: Jolaine Artist, MD;  Location: [facility] CATH LAB;  Service: Cardiovascular;  Laterality: N/A;   RIGHT HEART CATH N/A 06/27/2018   Procedure: RIGHT HEART CATH;  Surgeon: Jolaine Artist, MD;  Location: [place] CV LAB;  Service: Cardiovascular;  Laterality: N/A;   RIGHT/LEFT HEART CATH AND CORONARY ANGIOGRAPHY N/A 05/14/2019   Procedure: RIGHT/LEFT HEART CATH AND CORONARY ANGIOGRAPHY;  Surgeon: Jolaine Artist, MD;  Location: [place] CV LAB;  Service: Cardiovascular;  Laterality: N/A;    There were no vitals filed for this visit.   Subjective Assessment - 01/28/21 1142     Subjective pt states she had to remove her bandages last night because her legs were hurting when she went to bed.  Comes today with soaked bedroom shoes and socks on due to rain.    Currently in Pain?  No/denies                   LYMPHEDEMA/ONCOLOGY QUESTIONNAIRE - 01/28/21 1147       Lymphedema Stage   Stage STAGE 2 SPONTANEOUSLY IRREVERSIBLE      Lymphedema Assessments   Lymphedema Assessments Lower extremities      Right Lower Extremity Lymphedema   30 cm Proximal to Suprapatella 70 cm   no measurement taken on 1/4   20 cm Proximal to Suprapatella 70 cm   was 70.3 on 1/4 evaluation   10 cm Proximal to Suprapatella 68 cm   was 68.5   At Midpatella/Popliteal Crease 50 cm   was 54.5   30 cm Proximal to Floor at Lateral Plantar Foot 53 cm   was 60.2   20 cm Proximal to Floor at Lateral Plantar Foot 47.5 1   was 46.8   10 cm Proximal to Floor at Lateral Malleoli 28.5 cm   was 33.5   Circumference of ankle/heel 35.5 cm.   was 37   5 cm Proximal to 1st MTP Joint 28 cm   was 27.5   Across MTP Joint 25.5 cm   was 25.5     Left Lower Extremity Lymphedema   30 cm Proximal to Suprapatella 68 cm   was 68.2 on 1/4 evaluation   20 cm Proximal to Suprapatella 68 cm   was 68.2   10 cm Proximal to Suprapatella 65 cm   was 67   At Midpatella/Popliteal Crease 48 cm   was 51   30 cm Proximal to Floor at Lateral Plantar Foot 53.5 cm   was 62.7   20 cm Proximal to Floor at Lateral Plantar Foot 47 cm   was 44.2   10 cm Proximal to Floor at Lateral Malleoli 29.5 cm   was 34   Circumference of ankle/heel 35 cm.   was 36.6   5 cm Proximal to 1st MTP Joint 26 cm   was 27.8   Across MTP Joint 25 cm   was 26.6                       [facility] Adult PT Treatment/Exercise - 01/28/21 0001       Manual Therapy   Manual Therapy Manual Lymphatic Drainage (MLD);Compression Bandaging;Other (comment)    Manual therapy comments completed seperate from all other aspects of treatment    Manual Lymphatic Drainage (MLD) supine only due to breathing; bil inguinal-axillary anastomosis including short neck, deep and superficial abdominal nodes to bil LE's    Compression Bandaging to distal bil  LE's using profore system    Other Manual Therapy measurement                     PT Education - 01/28/21 1144     Education Details educated on importance of compression for reducing lymphedema and need of compression for lifelong maintenance.   Encouraged to keep bandaging in place and to try tylenol prior to bed or completing heelraises rather than rubbing leg (thats why she removed them to rub them).  Again, reminded to contact podiatrist for nailcare/possible shoes and need to find shoes to wear rather than bedroom shoes that do not contain support or protection of feet.    Person(s) Educated Patient    Methods Explanation    Comprehension Verbalized understanding;Need further instruction              PT Short Term Goals - 01/07/21 1125       PT SHORT TERM GOAL #1   Title Pt to be I in HEP to assist in increasing the lymphatic circulation    Time 2    Period Weeks    Status New    Target Date 01/21/21      PT SHORT TERM GOAL #2   Title PT to be completing good skin care on a daily basis    Time 2    Period Weeks    Status New      PT SHORT TERM GOAL #3   Title PT to have lost 2-3 cm of fluid from B LE to reduce risk of cellulitis    Time 6    Period Weeks    Status New   Pt will not be starting treatment for 3 weeks   Target Date 02/13/21               PT Long Term Goals - 01/07/21 1127       PT LONG TERM GOAL #1   Title Pt to have lost 5-6  cm from B LE to reduce risk of cellulitis and to be able to fit into shoes and pants    Time 10    Period Weeks    Status New    Target Date 03/06/21      PT LONG TERM GOAL #2   Title PT to have and be using a compression pump    Time 10    Period Weeks    Status New      PT LONG TERM GOAL #3   Title PT to have and to be able to don juxtafit    Time 10    Period Weeks    Status New                   Plan - 01/28/21 1152     Clinical Impression Statement LE's measured with noted  reduction bilaterally, especially at 10cm and 30cm areas.  20cm is actually sligtly increased bilaterally, however feel this is due to the "leveling" from above and below .  Pt educated on importance of compression for reducing lymphedema and need of compression for lifelong maintenance.   Encouraged to keep bandaging in place and to try tylenol prior to bed or completing heelraises rather than rubbing leg (thats why she removed them to rub them).  Encourged to remove one layer if she must and try to keep remaining layers in place to have some compression available.    Again, reminded to contact podiatrist for nailcare/possible shoes  and need to find shoes to wear rather than bedroom shoes that do not contain support or protection of feet.  For today plastic was used under the blue bootie with instructions to remove this once home to keep dry from the rain.  States her neighbor or son may be able to take her to get shoes this weekend.  Pt also reports she will try to call podiatrist tomorrow.    Personal Factors and Comorbidities Comorbidity 3+;Fitness;Time since onset of injury/illness/exacerbation    Comorbidities HTN, obesity, CHF, COPD, OA, Hx of cellulitis    Examination-Activity Limitations Dressing;Hygiene/Grooming;Locomotion Level;Squat;Stairs    Examination-Participation Restrictions Cleaning;Community Activity;Meal Prep    Stability/Clinical Decision Making Evolving/Moderate complexity    Rehab Potential Good    PT Frequency 3x / week    PT Duration 6 weeks    PT Treatment/Interventions Therapeutic exercise;Manual lymph drainage;Compression bandaging    PT Next Visit Plan Continue total decongestive techniques.  continue with profore system using foam until short stretch avalable.  F/U with making appt with podiatrist and obtaining shoes.    PT Home Exercise Plan sitting: ankle pumps, LAQ, hip ab/adduction, marching,diaphragmic breathing and lymph squeeze             Patient will benefit  from skilled therapeutic intervention in order to improve the following deficits and impairments:  Obesity, Decreased scar mobility, Increased edema, Decreased skin integrity  Visit Diagnosis: Lymphedema, not elsewhere classified     Problem List Patient Active Problem List   Diagnosis Date Noted   Knee osteoarthritis 12/12/2020   Vision changes 09/02/2020   Depression 09/02/2020   Swelling of lower extremity 08/13/2020   Left paraspinal back pain 05/05/2020   Hearing difficulty of both ears 05/05/2020   Internal hemorrhoid 01/11/2020   External hemorrhoid 01/11/2020   Fibroid, uterine 11/07/2019   Vagina bleeding 85/92/7639   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   Bleeding from the genitourinary system 09/13/2019   Gross hematuria 09/13/2019   Chronic venous stasis 05/07/2019   Urinary incontinence 12/12/2018   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[place])    Right ovarian cyst 05/19/2017   Peripheral neuropathy ([place]) 09/27/2014   Healthcare maintenance 09/27/2014   Atrial flutter ([place]) 08/17/2012   Right heart failure, NYHA class 3 ([place]) 03/15/2012   Allergic rhinitis 11/16/2011   Diabetes ([place]) 10/15/2010   Hyperlipidemia 10/15/2010   Obesity 10/14/2010   Obstructive sleep apnea 02/21/2009   GERD 11/29/2005   Teena Irani, PTA/CLT, WTA [phone]  Teena Irani, PTA 01/28/2021, 12:00 PM  [place] [address] [place], [place], [address] Phone: [phone]   Fax:  [phone]  Name: [name] MRN: [id number] Date of Birth: 1960-03-20

## 2021-01-30 ENCOUNTER — Ambulatory Visit (HOSPITAL_COMMUNITY): Payer: Medicaid Other

## 2021-01-30 ENCOUNTER — Other Ambulatory Visit: Payer: Self-pay

## 2021-01-30 ENCOUNTER — Encounter (HOSPITAL_COMMUNITY): Payer: Self-pay

## 2021-01-30 DIAGNOSIS — I890 Lymphedema, not elsewhere classified: Secondary | ICD-10-CM

## 2021-01-30 NOTE — Therapy (Signed)
[place], [place], [address] Phone: [phone]   Fax:  [phone]  Physical Therapy Treatment  Patient Details  Name: [name] MRN: [id number] Date of Birth: 18-Oct-1960 Referring Provider (PT): Georjean Mode   Encounter Date: 01/30/2021   PT End of Session - 01/30/21 1203     Visit Number 4    Number of Visits 18    Date for PT Re-Evaluation 03/13/21    Authorization Type Healthy blue    Authorization Time Period 15 visits approved 1/23-->02/27/21    Authorization - Visit Number 3    Authorization - Number of Visits 15    PT Start Time 1050    PT Stop Time 1155    PT Time Calculation (min) 65 min    Activity Tolerance Patient tolerated treatment well    Behavior During Therapy [facility] for tasks assessed/performed             Past Medical History:  Diagnosis Date   Acute on chronic diastolic (congestive) heart failure ([place]) 06/25/2013   Acute on chronic respiratory failure ([place]) 06/15/2018   Anemia, iron deficiency    secondary to menhorrhagia, on oral iron, also b12 def, getting monthly b12 shots   Cellulitis 05/2019   RIGHT LOWER EXTREMITY   CHF (congestive heart failure) (HCC)    Chronic cough    secondary to alleriges and post nasal drip   Cognitive impairment    COPD (chronic obstructive pulmonary disease) ([place])    Cor pulmonale (HCC)    PA Peak pressure 29mHg   Depression    Diabetes mellitus    well controlled on metformin   Diastolic heart failure (HCC)    GERD (gastroesophageal reflux disease)    H/O mental retardation    Herpes    Hyperlipidemia    Hypertension    OSA (obstructive sleep apnea)    CPAP   Pulmonary hypertension ([place]    Renal disorder    Restrictive lung disease    PFTs 06/2012 (FVC 54% predicted and FEV1 68% predicted w minimal bronchodilator response).   Shortness of breath    Venous stasis ulcer ([place]    chornic, ?followed up at wound care center,  multiple courses of antibiotics in past for cellulitis, on lasix    Past Surgical History:  Procedure Laterality Date   CARDIAC CATHETERIZATION N/A 01/13/2016   Procedure: Right Heart Cath;  Surgeon: DJolaine Artist MD;  [facility];  Service: Cardiovascular;  Laterality: N/A;   CHOLECYSTECTOMY     COLONOSCOPY WITH PROPOFOL N/A 12/20/2019   Procedure: COLONOSCOPY WITH PROPOFOL;  Surgeon: JMilus Banister MD;  [facility];  Service: Endoscopy;  Laterality: N/A;   IR FLUORO GUIDE CV LINE LEFT  11/16/2019   IR FLUORO GUIDE CV LINE LEFT  06/17/2020   IR FLUORO GUIDE CV LINE LEFT  07/04/2020   IR FLUORO GUIDE CV LINE RIGHT  05/09/2019   IR FLUORO GUIDE CV LINE RIGHT  08/12/2019   IR REMOVAL TUN CV CATH W/O FL  05/05/2019   IR [place]IDE VASC ACCESS LEFT  11/16/2019   IR [place]IDE VASC ACCESS LEFT  06/17/2020   IR [place]IDE VASC ACCESS LEFT  07/04/2020   IR [place]IDE VASC ACCESS RIGHT  05/09/2019   IR [place] VASC ACCESS RIGHT  08/12/2019   IR [place] VASC ACCESS RIGHT  11/16/2019   IR VENO/JUGULAR RIGHT  11/16/2019   LEFT AND  RIGHT HEART CATHETERIZATION WITH CORONARY ANGIOGRAM N/A 02/28/2014   Procedure: LEFT AND RIGHT HEART CATHETERIZATION WITH CORONARY ANGIOGRAM;  Surgeon: Jolaine Artist, MD;  Location: [facility] CATH LAB;  Service: Cardiovascular;  Laterality: N/A;   RIGHT HEART CATH N/A 06/27/2018   Procedure: RIGHT HEART CATH;  Surgeon: Jolaine Artist, MD;  Location: [place] CV LAB;  Service: Cardiovascular;  Laterality: N/A;   RIGHT/LEFT HEART CATH AND CORONARY ANGIOGRAPHY N/A 05/14/2019   Procedure: RIGHT/LEFT HEART CATH AND CORONARY ANGIOGRAPHY;  Surgeon: Jolaine Artist, MD;  Location: [place] CV LAB;  Service: Cardiovascular;  Laterality: N/A;    There were no vitals filed for this visit.   Subjective Assessment - 01/30/21 1201     Subjective Pt arrived with dressings intact, reports she took some Tylonel that helped with her pain at night.  Arrived with  bedroom slippers on.    Pertinent History Htn, CHF, COPD, OA, RT LE cellulitis, obesity.    Patient Stated Goals legs to be smaller    Currently in Pain? No/denies                               [facility] Adult PT Treatment/Exercise - 01/30/21 0001       Manual Therapy   Manual Therapy Manual Lymphatic Drainage (MLD);Compression Bandaging;Other (comment)    Manual therapy comments completed seperate from all other aspects of treatment    Manual Lymphatic Drainage (MLD) bil inguinal-axillary anastomosis including short neck, deep and superficial abdominal nodes BLE; supine anterior, sidelying for posterior    Compression Bandaging to distal bil LE's using profore system 1/2in foam                       PT Short Term Goals - 01/07/21 1125       PT SHORT TERM GOAL #1   Title Pt to be I in HEP to assist in increasing the lymphatic circulation    Time 2    Period Weeks    Status New    Target Date 01/21/21      PT SHORT TERM GOAL #2   Title PT to be completing good skin care on a daily basis    Time 2    Period Weeks    Status New      PT SHORT TERM GOAL #3   Title PT to have lost 2-3 cm of fluid from B LE to reduce risk of cellulitis    Time 6    Period Weeks    Status New   Pt will not be starting treatment for 3 weeks   Target Date 02/13/21               PT Long Term Goals - 01/07/21 1127       PT LONG TERM GOAL #1   Title Pt to have lost 5-6  cm from B LE to reduce risk of cellulitis and to be able to fit into shoes and pants    Time 10    Period Weeks    Status New    Target Date 03/06/21      PT LONG TERM GOAL #2   Title PT to have and be using a compression pump    Time 10    Period Weeks    Status New      PT LONG TERM GOAL #3   Title PT to have and to  be able to don juxtafit    Time 10    Period Weeks    Status New                   Plan - 01/30/21 1204     Clinical Impression Statement Manual  lymphedema decongestive techniqiues complete anterior and posterior (sidelying) this session.  Pt c/o skin pinched with 1/2in foam, foam adjusted for comfort prior application of profore BLE.  Pt encouraged to call MD concerning nail trim.    Personal Factors and Comorbidities Comorbidity 3+;Fitness;Time since onset of injury/illness/exacerbation    Comorbidities HTN, obesity, CHF, COPD, OA, Hx of cellulitis    Examination-Activity Limitations Dressing;Hygiene/Grooming;Locomotion Level;Squat;Stairs    Examination-Participation Restrictions Cleaning;Community Activity;Meal Prep    Stability/Clinical Decision Making Evolving/Moderate complexity    Clinical Decision Making Moderate    Rehab Potential Good    PT Frequency 3x / week    PT Duration 6 weeks    PT Treatment/Interventions Therapeutic exercise;Manual lymph drainage;Compression bandaging    PT Next Visit Plan Continue total decongestive techniques.  continue with profore system using foam until short stretch avalable.  F/U with making appt with podiatrist and obtaining shoes.    PT Home Exercise Plan sitting: ankle pumps, LAQ, hip ab/adduction, marching,diaphragmic breathing and lymph squeeze    Consulted and Agree with Plan of Care Patient             Patient will benefit from skilled therapeutic intervention in order to improve the following deficits and impairments:  Obesity, Decreased scar mobility, Increased edema, Decreased skin integrity  Visit Diagnosis: Lymphedema, not elsewhere classified     Problem List Patient Active Problem List   Diagnosis Date Noted   Knee osteoarthritis 12/12/2020   Vision changes 09/02/2020   Depression 09/02/2020   Swelling of lower extremity 08/13/2020   Left paraspinal back pain 05/05/2020   Hearing difficulty of both ears 05/05/2020   Internal hemorrhoid 01/11/2020   External hemorrhoid 01/11/2020   Fibroid, uterine 11/07/2019   Vagina bleeding 40/98/1191   Lichen simplex  chronicus 10/15/2019   Bleeding from the genitourinary system 09/13/2019   Gross hematuria 09/13/2019   Chronic venous stasis 05/07/2019   Urinary incontinence 12/12/2018   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[place])    Right ovarian cyst 05/19/2017   Peripheral neuropathy ([place]) 09/27/2014   Healthcare maintenance 09/27/2014   Atrial flutter ([place]) 08/17/2012   Right heart failure, NYHA class 3 ([place]) 03/15/2012   Allergic rhinitis 11/16/2011   Diabetes ([place]) 10/15/2010   Hyperlipidemia 10/15/2010   Obesity 10/14/2010   Obstructive sleep apnea 02/21/2009   GERD 11/29/2005   Ihor Austin, LPTA/CLT; CBIS [phone]  Aldona Lento, PTA 01/30/2021, 12:12 PM  [place] [address] [place], [place], [address] Phone: [phone]   Fax:  [phone]  Name: [name] MRN: [id number] Date of Birth: [birth year]

## 2021-02-01 ENCOUNTER — Other Ambulatory Visit: Payer: Self-pay

## 2021-02-01 ENCOUNTER — Emergency Department (HOSPITAL_COMMUNITY): Payer: Medicaid Other

## 2021-02-01 ENCOUNTER — Encounter (HOSPITAL_COMMUNITY): Payer: Self-pay

## 2021-02-01 ENCOUNTER — Emergency Department (HOSPITAL_COMMUNITY)
Admission: EM | Admit: 2021-02-01 | Discharge: 2021-02-01 | Disposition: A | Discharge status: Home / Self Care | Payer: Medicaid Other | Attending: Emergency Medicine | Admitting: Emergency Medicine

## 2021-02-01 DIAGNOSIS — I2781 Cor pulmonale (chronic): Secondary | ICD-10-CM | POA: Diagnosis not present

## 2021-02-01 DIAGNOSIS — Z760 Encounter for issue of repeat prescription: Secondary | ICD-10-CM | POA: Diagnosis not present

## 2021-02-01 DIAGNOSIS — U071 COVID-19: Secondary | ICD-10-CM | POA: Insufficient documentation

## 2021-02-01 DIAGNOSIS — I5032 Chronic diastolic (congestive) heart failure: Secondary | ICD-10-CM | POA: Diagnosis not present

## 2021-02-01 DIAGNOSIS — I4892 Unspecified atrial flutter: Secondary | ICD-10-CM | POA: Diagnosis not present

## 2021-02-01 DIAGNOSIS — J9611 Chronic respiratory failure with hypoxia: Secondary | ICD-10-CM | POA: Diagnosis not present

## 2021-02-01 DIAGNOSIS — I130 Hypertensive heart and chronic kidney disease with heart failure and stage 1 through stage 4 chronic kidney disease, or unspecified chronic kidney disease: Secondary | ICD-10-CM | POA: Diagnosis not present

## 2021-02-01 DIAGNOSIS — Z7984 Long term (current) use of oral hypoglycemic drugs: Secondary | ICD-10-CM | POA: Diagnosis not present

## 2021-02-01 DIAGNOSIS — J440 Chronic obstructive pulmonary disease with acute lower respiratory infection: Secondary | ICD-10-CM | POA: Diagnosis not present

## 2021-02-01 DIAGNOSIS — Z79899 Other long term (current) drug therapy: Secondary | ICD-10-CM | POA: Diagnosis not present

## 2021-02-01 DIAGNOSIS — N189 Chronic kidney disease, unspecified: Secondary | ICD-10-CM | POA: Diagnosis not present

## 2021-02-01 DIAGNOSIS — J1282 Pneumonia due to coronavirus disease 2019: Secondary | ICD-10-CM | POA: Diagnosis not present

## 2021-02-01 DIAGNOSIS — Z7901 Long term (current) use of anticoagulants: Secondary | ICD-10-CM | POA: Diagnosis not present

## 2021-02-01 DIAGNOSIS — K219 Gastro-esophageal reflux disease without esophagitis: Secondary | ICD-10-CM | POA: Diagnosis not present

## 2021-02-01 DIAGNOSIS — Z91040 Latex allergy status: Secondary | ICD-10-CM | POA: Insufficient documentation

## 2021-02-01 DIAGNOSIS — F32A Depression, unspecified: Secondary | ICD-10-CM | POA: Diagnosis not present

## 2021-02-01 DIAGNOSIS — E785 Hyperlipidemia, unspecified: Secondary | ICD-10-CM | POA: Diagnosis not present

## 2021-02-01 DIAGNOSIS — N183 Chronic kidney disease, stage 3 unspecified: Secondary | ICD-10-CM | POA: Diagnosis not present

## 2021-02-01 DIAGNOSIS — G4733 Obstructive sleep apnea (adult) (pediatric): Secondary | ICD-10-CM | POA: Diagnosis not present

## 2021-02-01 DIAGNOSIS — E1140 Type 2 diabetes mellitus with diabetic neuropathy, unspecified: Secondary | ICD-10-CM | POA: Diagnosis not present

## 2021-02-01 DIAGNOSIS — I272 Pulmonary hypertension, unspecified: Secondary | ICD-10-CM | POA: Diagnosis not present

## 2021-02-01 DIAGNOSIS — Z66 Do not resuscitate: Secondary | ICD-10-CM | POA: Diagnosis not present

## 2021-02-01 DIAGNOSIS — Z888 Allergy status to other drugs, medicaments and biological substances status: Secondary | ICD-10-CM | POA: Diagnosis not present

## 2021-02-01 DIAGNOSIS — I5082 Biventricular heart failure: Secondary | ICD-10-CM | POA: Diagnosis not present

## 2021-02-01 DIAGNOSIS — M109 Gout, unspecified: Secondary | ICD-10-CM | POA: Diagnosis not present

## 2021-02-01 DIAGNOSIS — I2721 Secondary pulmonary arterial hypertension: Secondary | ICD-10-CM | POA: Diagnosis not present

## 2021-02-01 DIAGNOSIS — J984 Other disorders of lung: Secondary | ICD-10-CM | POA: Diagnosis not present

## 2021-02-01 DIAGNOSIS — E1122 Type 2 diabetes mellitus with diabetic chronic kidney disease: Secondary | ICD-10-CM | POA: Diagnosis not present

## 2021-02-01 DIAGNOSIS — I517 Cardiomegaly: Secondary | ICD-10-CM | POA: Diagnosis not present

## 2021-02-01 LAB — CBC
HCT: 42.7 % (ref 36.0–46.0)
Hemoglobin: 12.4 g/dL (ref 12.0–15.0)
MCH: 22.2 pg — ABNORMAL LOW (ref 26.0–34.0)
MCHC: 29 g/dL — ABNORMAL LOW (ref 30.0–36.0)
MCV: 76.5 fL — ABNORMAL LOW (ref 80.0–100.0)
Platelets: 495 10*3/uL — ABNORMAL HIGH (ref 150–400)
RBC: 5.58 MIL/uL — ABNORMAL HIGH (ref 3.87–5.11)
RDW: 27.7 % — ABNORMAL HIGH (ref 11.5–15.5)
WBC: 7.6 10*3/uL (ref 4.0–10.5)
nRBC: 0 % (ref 0.0–0.2)

## 2021-02-01 LAB — COMPREHENSIVE METABOLIC PANEL
ALT: 15 U/L (ref 0–44)
AST: 25 U/L (ref 15–41)
Albumin: 3.4 g/dL — ABNORMAL LOW (ref 3.5–5.0)
Alkaline Phosphatase: 66 U/L (ref 38–126)
Anion gap: 7 (ref 5–15)
BUN: 15 mg/dL (ref 6–20)
CO2: 30 mmol/L (ref 22–32)
Calcium: 8.8 mg/dL — ABNORMAL LOW (ref 8.9–10.3)
Chloride: 105 mmol/L (ref 98–111)
Creatinine, Ser: 0.77 mg/dL (ref 0.44–1.00)
GFR, Estimated: >60 mL/min (ref >60)
Glucose, Bld: 95 mg/dL (ref 70–99)
Potassium: 4 mmol/L (ref 3.5–5.1)
Sodium: 142 mmol/L (ref 135–145)
Total Bilirubin: 0.5 mg/dL (ref 0.3–1.2)
Total Protein: 6.5 g/dL (ref 6.5–8.1)

## 2021-02-01 LAB — RESP PANEL BY RT-PCR (FLU A&B, COVID) ARPGX2
Influenza A by PCR: NEGATIVE
Influenza B by PCR: NEGATIVE
SARS Coronavirus 2 by RT PCR: POSITIVE — AB

## 2021-02-01 MED ORDER — ACETAMINOPHEN 325 MG PO TABS: 650.0000 mg | ORAL_TABLET | Freq: Four times a day (QID) | ORAL | Status: DC | PRN

## 2021-02-01 MED ORDER — SODIUM CHLORIDE 0.9 % IV SOLN: 100.0000 mg | Freq: Every day | INTRAVENOUS | Status: DC

## 2021-02-01 MED ORDER — SODIUM CHLORIDE 0.9 % IV SOLN
200.0000 mg | Freq: Once | INTRAVENOUS | Status: AC
  Administered 2021-02-01: 200 mg via INTRAVENOUS
  Filled 2021-02-01: qty 40

## 2021-02-01 MED ORDER — TREPROSTINIL 100 MG/20ML IJ SOLN: 40.0000 ng/kg/min | INTRAVENOUS | Status: DC

## 2021-02-01 MED ORDER — TREPROSTINIL 100 MG/20ML IJ SOLN
40.0000 ng/kg/min | INTRAVENOUS | Status: DC
  Administered 2021-02-01: 40 ng/kg/min via INTRAVENOUS
  Filled 2021-02-01: qty 7

## 2021-02-01 NOTE — ED Notes (Signed)
Pt sitting up in chair at bedside eating dinner. Pt has remained stable after re-initiating remodulin infusion on pump. Pt in no distress, denies needs. Pt has call bell in reach. Awaiting transport to [facility].

## 2021-02-01 NOTE — ED Provider Notes (Addendum)
[facility] EMERGENCY DEPARTMENT Provider Note   CSN: [id number] Arrival date & time: 02/01/21  6979     History  Chief Complaint  Patient presents with   Medication Refill    [name] is a 61 y.o. female presenting for medication refill.  Patient states she is on Remodulin for pulmonary hypertension.  She has a continuous subcu injector.  Unfortunately, the medication has had some recalls, and due to supply issues pt accidentally ran out, last dose was on Friday, 2 days ago.  She reports some mild shortness of breath, but has baseline shortness of breath.  She states that she was not out of her medication, would not be severe enough to warrant an ER visit.  She denies fevers, chest pain, new or worsening cough.  She is not out of any other medication.  HPI     Home Medications Prior to Admission medications   Medication Sig Start Date End Date Taking? Authorizing Provider  ACCU-CHEK AVIVA PLUS test strip USE 1 STRIP TO CHECK GLUCOSE ONCE DAILY Patient not taking: Reported on 10/24/2020 05/25/18   [name], MD  acetaminophen (TYLENOL) 500 MG tablet Take 1,000 mg by mouth every 6 (six) hours as needed.    [provider]  benzonatate (TESSALON) 200 MG capsule Take 1 capsule (200 mg total) by mouth 3 (three) times daily as needed for cough. Patient not taking: Reported on 12/08/2020 03/26/20 03/26/21  Laurin Coder, MD  cetirizine (EQ ALLERGY RELIEF, CETIRIZINE,) 10 MG tablet Take 1 tablet (10 mg total) by mouth daily. 12/09/20   Masters, Katie, DO  cycloSPORINE (RESTASIS) 0.05 % ophthalmic emulsion Place 1 drop into both eyes 2 (two) times daily as needed (dry eyes). Patient not taking: Reported on 10/24/2020    [provider]  diclofenac Sodium (VOLTAREN) 1 % GEL Apply 4 g topically 4 (four) times daily. 12/11/20   Marianna Payment, MD  diphenhydrAMINE (BENADRYL) 25 MG tablet Take 50-100 mg by mouth 2 (two) times daily as needed for  itching. Patient not taking: Reported on 12/08/2020    [provider]  Ferrous Sulfate (IRON PO) Take by mouth.    [provider]  FIBER ADULT GUMMIES PO Take by mouth.    [provider]  fluticasone (FLONASE) 50 MCG/ACT nasal spray Place 1 spray into both nostrils daily as needed for allergies. 05/13/20 05/13/21  [name], MD  gabapentin (NEURONTIN) 300 MG capsule TAKE TWO CAPSULES BY MOUTH THREE TIMES A DAY 01/14/21   Masters, [name], DO  metFORMIN (GLUCOPHAGE) 500 MG tablet Take 1/2 tablet by mouth with breakfast 12/09/20   Masters, Katie, DO  OXYGEN Inhale 4 L into the lungs continuous.    [provider]  pantoprazole (PROTONIX) 20 MG tablet Take 1 tablet (20 mg total) by mouth 2 (two) times daily. 12/09/20   Masters, Katie, DO  potassium chloride SA (KLOR-CON M) 20 MEQ tablet TAKE TWO TABLETS BY MOUTH TWICE A DAY 12/31/20   Christian, Rylee, MD  pravastatin (PRAVACHOL) 20 MG tablet Take 1 tablet (20 mg total) by mouth daily. 12/09/20   Masters, [name], DO  rivaroxaban (XARELTO) 20 MG TABS tablet Take 1 tablet (20 mg total) by mouth daily with supper. 12/09/20   Masters, Katie, DO  sildenafil (REVATIO) 20 MG tablet TAKE 1 TABLET THREE TIMES A DAY 10/02/20   Bensimhon, [name], MD  spironolactone (ALDACTONE) 25 MG tablet Take 1/2 tablet by mouth once daily. 12/09/20  Masters, [name], DO  torsemide (DEMADEX) 20 MG tablet Take 4 tablets (80 mg total) by mouth 2 (two) times daily. 12/09/20   Masters, Katie, DO  treprostinil (REMODULIN) 5 MG/ML SOLN injection See admin instructions. As of 08/09/2019: Add 7 ml of Remodulin to cassette and 93 ml of sterile diluent for Remodulin to cassetet for a total volume of 100 ml to make a concentration of 350,000 ng per ml.  Infuse via a CADD pump intravenously at a rate of 40 ml per 24 hours. Based on a dosing weight of 120 kg, the dose is 81 ng per kg per min.  Once opened, discard Remodulin vial after 30 days.  Sterile Diluent  for Remodulin vials are single use only. Once mixed, discard cassette after 48 hours.    [provider]  vitamin B-12 (CYANOCOBALAMIN) 500 MCG tablet Take 1 tablet (500 mcg total) by mouth daily. 12/09/20   Masters, [name], DO      Allergies    Aspirin, Codeine, Lisinopril, Sulfonamide derivatives, and Latex    Review of Systems   Review of Systems  Respiratory:  Positive for shortness of breath (chronic).   Cardiovascular:  Negative for chest pain.   Physical Exam Updated Vital Signs BP 129/70 (BP Location: Left Arm)    Pulse 71    Temp 98.7 F (37.1 C)    Resp 20    LMP 02/19/2011    SpO2 92%  Physical Exam Vitals and nursing note reviewed.  Constitutional:      General: She is not in acute distress.    Appearance: She is obese.     Comments: appears chronically ill, not in acute distress  HENT:     Head: Normocephalic and atraumatic.  Eyes:     Conjunctiva/sclera: Conjunctivae normal.     Pupils: Pupils are equal, round, and reactive to light.  Cardiovascular:     Rate and Rhythm: Normal rate and regular rhythm.     Pulses: Normal pulses.  Pulmonary:     Effort: Pulmonary effort is normal. No respiratory distress.     Comments: Speaking in short sentences.  Sats in the low 90s on 4 L which is baseline.  Diminished lung sounds throughout  Abdominal:     General: There is no distension.     Palpations: Abdomen is soft. There is no mass.     Tenderness: There is no abdominal tenderness. There is no guarding or rebound.  Musculoskeletal:        General: Normal range of motion.     Cervical back: Normal range of motion and neck supple.  Skin:    General: Skin is warm and dry.     Capillary Refill: Capillary refill takes less than 2 seconds.  Neurological:     Mental Status: She is alert and oriented to person, place, and time.  Psychiatric:        Mood and Affect: Mood and affect normal.        Speech: Speech normal.        Behavior: Behavior normal.    ED  Results / Procedures / Treatments   Labs (all labs ordered are listed, but only abnormal results are displayed) Labs Reviewed - No data to display  EKG None  Radiology No results found.  Procedures Procedures    Medications Ordered in ED Medications - No data to display  ED Course/ Medical Decision Making/ A&P  Medical Decision Making Amount and/or Complexity of Data Reviewed Labs: ordered. Radiology: ordered.    This patient presents to the ED for refill of medication. This involves a number of treatment options, and is a complaint that carries with it a moderate risk of complications and morbidity.     Co morbidities: OSA, restrictive lung disease, pulmonary hypertension, COPD, heart failure, cognitive impairment   Additional history: Reviewed charts from [facility], where patient sees Dr. Alisia Ferrari with pulmonology.  Additionally follows with Dr. Haroldine Laws more locally   Lab Tests:  I ordered, and personally interpreted labs.  The pertinent results include: No leukocytosis.  Hemoglobin stable.  Electrolytes stable. Unfortunately patient's COVID test was positive, in the setting of her son recently tested positive, this is likely a true/acute finding.   Imaging Studies:  I ordered imaging studies including cxr for port access I independently visualized and interpreted imaging which showed port in place.  Per radiology, there is atelectasis versus consolidation with a small pleural effusion.  Patient is not reporting worsening shortness of breath, fever, worsening cough or other signs of infection.  As such, likely atelectasis.  Will not start antibiotics at this time.  Cardiac Monitoring:  The patient was maintained on a cardiac monitor.  I personally viewed and interpreted the cardiac monitored which showed an underlying rhythm of: nsr   Medicines ordered:  I ordered medication including half dose of Remodulin for  pulmonary hypertension  As patient tested positive for COVID, remdesivir started per pharmacy recommendation.  Consults:  I requested consultation with the [facility] pulmonary service regarding restarting patient's Remodulin.  Discussed with Dr. Harlene Salts who recommends patient be started on a half dose remodulin for the first day.  She will need to be admitted, ideally at [facility], for continued titration and reinitiation.   Consulted with pharmacy to assist with dosing. Discussed with Dr. Acie Fredrickson from [facility] cardiology and reviewed the case.  They will round on patient if she is boarding in the ER prior to [facility] transfer for an extended period of time.  Disposition: Patient to be transferred to [facility] for admission.  At 1:25 PM I discussed with Abby from [facility] transfer center, there is a bed assignment for the pt.  She is pending transportation at this time .   Final Clinical Impression(s) / ED Diagnoses Final diagnoses:  Medication refill  Pulmonary hypertension ([place])  COVID-19    Rx / DC Orders ED Discharge Orders     None         Franchot Heidelberg, PA-C 02/01/21 [place], Keimon Basaldua, PA-C 02/01/21 [place], [place], DO 02/01/21 1434

## 2021-02-01 NOTE — ED Triage Notes (Signed)
Pt here for a medication refill. Pt has been out of her pulmonary HTN medication cartridge for 2 days. Pt on 4L  on arrival.

## 2021-02-02 ENCOUNTER — Ambulatory Visit (HOSPITAL_COMMUNITY)
Admission: RE | Admit: 2021-02-02 | Discharge: 2021-02-02 | Disposition: A | Discharge status: Home / Self Care | Payer: Medicaid Other | Source: Ambulatory Visit | Attending: Internal Medicine | Admitting: Internal Medicine

## 2021-02-02 ENCOUNTER — Ambulatory Visit (HOSPITAL_COMMUNITY): Payer: Medicaid Other | Admitting: Physical Therapy

## 2021-02-02 ENCOUNTER — Telehealth (HOSPITAL_COMMUNITY): Payer: Self-pay | Admitting: Physical Therapy

## 2021-02-02 DIAGNOSIS — I2781 Cor pulmonale (chronic): Secondary | ICD-10-CM | POA: Diagnosis not present

## 2021-02-02 DIAGNOSIS — R0609 Other forms of dyspnea: Secondary | ICD-10-CM | POA: Diagnosis not present

## 2021-02-02 DIAGNOSIS — J9611 Chronic respiratory failure with hypoxia: Secondary | ICD-10-CM | POA: Diagnosis not present

## 2021-02-02 DIAGNOSIS — E119 Type 2 diabetes mellitus without complications: Secondary | ICD-10-CM | POA: Diagnosis not present

## 2021-02-02 DIAGNOSIS — U071 COVID-19: Secondary | ICD-10-CM | POA: Diagnosis not present

## 2021-02-02 DIAGNOSIS — R0602 Shortness of breath: Secondary | ICD-10-CM | POA: Diagnosis not present

## 2021-02-02 DIAGNOSIS — N3946 Mixed incontinence: Secondary | ICD-10-CM | POA: Diagnosis not present

## 2021-02-02 NOTE — Telephone Encounter (Signed)
Pt did not show for appt.  Reviewed that she had went to ED yesterday for medication refill, however further review reveals possible transport to [facility].  Unable to reach patient by phone and mailbox was full.  Called son and left VM to call clinic regarding need to cancel remaining appts this week.  Teena Irani, PTA/CLT, Lissa Morales [phone]

## 2021-02-03 DIAGNOSIS — U071 COVID-19: Secondary | ICD-10-CM | POA: Diagnosis not present

## 2021-02-03 DIAGNOSIS — R0609 Other forms of dyspnea: Secondary | ICD-10-CM | POA: Diagnosis not present

## 2021-02-03 DIAGNOSIS — I2781 Cor pulmonale (chronic): Secondary | ICD-10-CM | POA: Diagnosis not present

## 2021-02-03 DIAGNOSIS — J9611 Chronic respiratory failure with hypoxia: Secondary | ICD-10-CM | POA: Diagnosis not present

## 2021-02-04 ENCOUNTER — Encounter (HOSPITAL_COMMUNITY): Payer: Medicaid Other | Admitting: Physical Therapy

## 2021-02-04 DIAGNOSIS — I5030 Unspecified diastolic (congestive) heart failure: Secondary | ICD-10-CM | POA: Diagnosis not present

## 2021-02-04 DIAGNOSIS — J9611 Chronic respiratory failure with hypoxia: Secondary | ICD-10-CM | POA: Diagnosis not present

## 2021-02-04 DIAGNOSIS — I2781 Cor pulmonale (chronic): Secondary | ICD-10-CM | POA: Diagnosis not present

## 2021-02-04 DIAGNOSIS — G4730 Sleep apnea, unspecified: Secondary | ICD-10-CM | POA: Diagnosis not present

## 2021-02-04 DIAGNOSIS — J984 Other disorders of lung: Secondary | ICD-10-CM | POA: Diagnosis not present

## 2021-02-04 DIAGNOSIS — U071 COVID-19: Secondary | ICD-10-CM | POA: Diagnosis not present

## 2021-02-04 DIAGNOSIS — R0609 Other forms of dyspnea: Secondary | ICD-10-CM | POA: Diagnosis not present

## 2021-02-04 DIAGNOSIS — I270 Primary pulmonary hypertension: Secondary | ICD-10-CM | POA: Diagnosis not present

## 2021-02-05 ENCOUNTER — Telehealth: Payer: Self-pay

## 2021-02-05 NOTE — Telephone Encounter (Signed)
Transition Care Management Follow-up Telephone Call Date of discharge and from where: 02/04/2021-[facility] How have you been since you were released from the hospital? Pt stated she is doing ok.  Any questions or concerns? No  Items Reviewed: Did the pt receive and understand the discharge instructions provided? Yes  Medications obtained and verified? Yes  Other? No  Any new allergies since your discharge? No  Dietary orders reviewed? No Do you have support at home? Yes   Home Care and Equipment/Supplies: Were home health services ordered? not applicable If so, what is the name of the agency? N/A  Has the agency set up a time to come to the patient's home? not applicable Were any new equipment or medical supplies ordered?  No What is the name of the medical supply agency? N/A Were you able to get the supplies/equipment? not applicable Do you have any questions related to the use of the equipment or supplies? No  Functional Questionnaire: (I = Independent and D = Dependent) ADLs: I  Bathing/Dressing- I  Meal Prep- I  Eating- I  Maintaining continence- I  Transferring/Ambulation- I  Managing Meds- I  Follow up appointments reviewed:  PCP Hospital f/u appt confirmed? No   Specialist Hospital f/u appt confirmed? No   Are transportation arrangements needed? No  If their condition worsens, is the pt aware to call PCP or go to the Emergency Dept.? Yes Was the patient provided with contact information for the PCP's office or ED? Yes Was to pt encouraged to call back with questions or concerns? Yes

## 2021-02-06 ENCOUNTER — Encounter (HOSPITAL_COMMUNITY): Payer: Medicaid Other

## 2021-02-06 ENCOUNTER — Telehealth: Payer: Self-pay | Admitting: *Deleted

## 2021-02-06 NOTE — Patient Outreach (Signed)
Care Coordination  02/06/2021  CATHELEEN LANGHORNE 10-20-1960 [id number]  RNCM attempting to reach Ms. Alto for follow up after recent hospitalization. RNCM was unable to leave a message, mailbox is full. RNCM will follow up with Ms. Rana at next scheduled telephone outreach on 02/10/21 @ 2:30pm.  [name] RN, [place] RN Care Coordinator

## 2021-02-09 ENCOUNTER — Encounter (HOSPITAL_COMMUNITY): Payer: Medicaid Other | Admitting: Physical Therapy

## 2021-02-10 ENCOUNTER — Other Ambulatory Visit: Payer: Self-pay | Admitting: *Deleted

## 2021-02-10 ENCOUNTER — Other Ambulatory Visit: Payer: Self-pay

## 2021-02-10 NOTE — Patient Outreach (Signed)
Care Coordination  02/10/2021  DENAJA VERHOEVEN [birth year] [id number]  Successful outreach today with Ms. Barley's DPR/Michael. Legrand Como reports his mother is doing fine. She has started having symptoms of Covid and she is planning to reschedule her upcoming PT appointment. RNCM requested Legrand Como to assist Ms. Badgett with deleting old voicemail. RNCM will attempt to reach patient again over the next two weeks.   [name] RN, BSN [place] RN Care Coordinator

## 2021-02-11 ENCOUNTER — Ambulatory Visit (HOSPITAL_COMMUNITY): Payer: Medicaid Other | Attending: Internal Medicine

## 2021-02-11 ENCOUNTER — Telehealth (HOSPITAL_COMMUNITY): Payer: Self-pay

## 2021-02-11 DIAGNOSIS — I890 Lymphedema, not elsewhere classified: Secondary | ICD-10-CM | POA: Insufficient documentation

## 2021-02-11 NOTE — Telephone Encounter (Signed)
No show, called and spoke to pt who stated she has been exposed to covid last Wednesday, went to [facility] Sunday through Wednesday/Thursday.  Discussed further apts and cancelled this Friday.  Pt to return 02/16/21.  Ihor Austin, LPTA/CLT; Delana Meyer [phone]

## 2021-02-12 ENCOUNTER — Ambulatory Visit (INDEPENDENT_AMBULATORY_CARE_PROVIDER_SITE_OTHER): Payer: Medicaid Other | Admitting: Student

## 2021-02-12 DIAGNOSIS — I270 Primary pulmonary hypertension: Secondary | ICD-10-CM | POA: Diagnosis not present

## 2021-02-12 DIAGNOSIS — Z09 Encounter for follow-up examination after completed treatment for conditions other than malignant neoplasm: Secondary | ICD-10-CM

## 2021-02-12 DIAGNOSIS — G4733 Obstructive sleep apnea (adult) (pediatric): Secondary | ICD-10-CM | POA: Diagnosis not present

## 2021-02-12 DIAGNOSIS — G4730 Sleep apnea, unspecified: Secondary | ICD-10-CM | POA: Diagnosis not present

## 2021-02-12 NOTE — Progress Notes (Deleted)
Unable to reach patient after 2 calls in the morning and 2 in the afternoon.

## 2021-02-12 NOTE — Progress Notes (Signed)
Unable to reach patient after 2 attempts in the morning and 2 in the afternoon.

## 2021-02-13 ENCOUNTER — Encounter (HOSPITAL_COMMUNITY): Payer: Medicaid Other | Admitting: Physical Therapy

## 2021-02-16 ENCOUNTER — Encounter (HOSPITAL_COMMUNITY): Payer: Medicaid Other | Admitting: Physical Therapy

## 2021-02-17 DIAGNOSIS — J9611 Chronic respiratory failure with hypoxia: Secondary | ICD-10-CM | POA: Diagnosis not present

## 2021-02-17 DIAGNOSIS — I2721 Secondary pulmonary arterial hypertension: Secondary | ICD-10-CM | POA: Diagnosis not present

## 2021-02-17 DIAGNOSIS — R0609 Other forms of dyspnea: Secondary | ICD-10-CM | POA: Diagnosis not present

## 2021-02-17 DIAGNOSIS — Z6841 Body Mass Index (BMI) 40.0 and over, adult: Secondary | ICD-10-CM | POA: Diagnosis not present

## 2021-02-17 DIAGNOSIS — I272 Pulmonary hypertension, unspecified: Secondary | ICD-10-CM | POA: Diagnosis not present

## 2021-02-17 DIAGNOSIS — I2781 Cor pulmonale (chronic): Secondary | ICD-10-CM | POA: Diagnosis not present

## 2021-02-23 ENCOUNTER — Ambulatory Visit (HOSPITAL_COMMUNITY): Payer: Medicaid Other | Admitting: Physical Therapy

## 2021-02-23 ENCOUNTER — Other Ambulatory Visit: Payer: Self-pay

## 2021-02-23 DIAGNOSIS — I890 Lymphedema, not elsewhere classified: Secondary | ICD-10-CM

## 2021-02-23 NOTE — Therapy (Signed)
[facility] [address] [place], [place], [address] Phone: [phone]   Fax:  [phone]  Physical Therapy Treatment  Patient Details  Name: [name] MRN: [id number] Date of Birth: May 07, 1960 Referring Provider (PT): Georjean Mode   Encounter Date: 02/23/2021   PT End of Session - 02/23/21 1154     Visit Number 5    Number of Visits 18    Date for PT Re-Evaluation 03/13/21    Authorization Type [employer] blue    Authorization Time Period 15 visits approved 1/23-->02/27/21    Authorization - Visit Number 4    Authorization - Number of Visits 15    PT Start Time 6144    PT Stop Time 1145    PT Time Calculation (min) 53 min    Activity Tolerance Patient tolerated treatment well    Behavior During Therapy [facility] for tasks assessed/performed             Past Medical History:  Diagnosis Date   Acute on chronic diastolic (congestive) heart failure ([place]) 06/25/2013   Acute on chronic respiratory failure ([place]) 06/15/2018   Anemia, iron deficiency    secondary to menhorrhagia, on oral iron, also b12 def, getting monthly b12 shots   Cellulitis 05/2019   RIGHT LOWER EXTREMITY   CHF (congestive heart failure) (HCC)    Chronic cough    secondary to alleriges and post nasal drip   Cognitive impairment    COPD (chronic obstructive pulmonary disease) ([place])    Cor pulmonale (HCC)    PA Peak pressure 54mHg   Depression    Diabetes mellitus    well controlled on metformin   Diastolic heart failure (HCC)    GERD (gastroesophageal reflux disease)    H/O mental retardation    Herpes    Hyperlipidemia    Hypertension    OSA (obstructive sleep apnea)    CPAP   Pulmonary hypertension ([place]    Renal disorder    Restrictive lung disease    PFTs 06/2012 (FVC 54% predicted and FEV1 68% predicted w minimal bronchodilator response).   Shortness of breath    Venous stasis ulcer ([place]    chornic, ?followed up at wound care center,  multiple courses of antibiotics in past for cellulitis, on lasix    Past Surgical History:  Procedure Laterality Date   CARDIAC CATHETERIZATION N/A 01/13/2016   Procedure: Right Heart Cath;  Surgeon: DJolaine Artist MD;  [facility];  Service: Cardiovascular;  Laterality: N/A;   CHOLECYSTECTOMY     COLONOSCOPY WITH PROPOFOL N/A 12/20/2019   Procedure: COLONOSCOPY WITH PROPOFOL;  Surgeon: JMilus Banister MD;  Location: [facility];  Service: Endoscopy;  Laterality: N/A;   IR FLUORO GUIDE CV LINE LEFT  11/16/2019   IR FLUORO GUIDE CV LINE LEFT  06/17/2020   IR FLUORO GUIDE CV LINE LEFT  07/04/2020   IR FLUORO GUIDE CV LINE RIGHT  05/09/2019   IR FLUORO GUIDE CV LINE RIGHT  08/12/2019   IR REMOVAL TUN CV CATH W/O FL  05/05/2019   IR [place]ASC ACCESS LEFT  11/16/2019   IR [place] VASC ACCESS LEFT  06/17/2020   IR [place] VASC ACCESS LEFT  07/04/2020   IR [place] VASC ACCESS RIGHT  05/09/2019   IR [place] VASC ACCESS RIGHT  08/12/2019   IR [place] VASC ACCESS RIGHT  11/16/2019   IR VENO/JUGULAR RIGHT  11/16/2019   LEFT AND  RIGHT HEART CATHETERIZATION WITH CORONARY ANGIOGRAM N/A 02/28/2014   Procedure: LEFT AND RIGHT HEART CATHETERIZATION WITH CORONARY ANGIOGRAM;  Surgeon: Jolaine Artist, MD;  Location: [facility] C[facility] LAB;  Service: Cardiovascular;  Laterality: N/A;   RIGHT HEART CATH N/A 06/27/2018   Procedure: RIGHT HEART CATH;  Surgeon: Jolaine Artist, MD;  Location: [place] CV LAB;  Service: Cardiovascular;  Laterality: N/A;   RIGHT/LEFT HEART CATH AND CORONARY ANGIOGRAPHY N/A 05/14/2019   Procedure: RIGHT/LEFT HEART CATH AND CORONARY ANGIOGRAPHY;  Surgeon: Jolaine Artist, MD;  Location: [place] CV LAB;  Service: Cardiovascular;  Laterality: N/A;    There were no vitals filed for this visit.   Subjective Assessment - 02/23/21 1147     Subjective pt states she was sick with her lungs and then got COVID.  STates she can tell her legs are swollen back up.                    LYMPHEDEMA/ONCOLOGY QUESTIONNAIRE - 02/23/21 1148       Lymphedema Stage   Stage STAGE 2 SPONTANEOUSLY IRREVERSIBLE      Lymphedema Assessments   Lymphedema Assessments Lower extremities      Right Lower Extremity Lymphedema   30 cm Proximal to Suprapatella 70 cm   no measurement taken on 1/4   20 cm Proximal to Suprapatella 70 cm   was 70.3 on 1/4 evaluation   10 cm Proximal to Suprapatella 68 cm   was 68.5   At Midpatella/Popliteal Crease 50 cm   was 54.5   30 cm Proximal to Floor at Lateral Plantar Foot 55 cm   was 60.2   20 cm Proximal to Floor at Lateral Plantar Foot 50 1   was 46.8   10 cm Proximal to Floor at Lateral Malleoli 31.5 cm   was 33.5   Circumference of ankle/heel 36 cm.   was 37   5 cm Proximal to 1st MTP Joint 27 cm   was 27.5   Across MTP Joint 25.5 cm   was 25.5     Left Lower Extremity Lymphedema   30 cm Proximal to Suprapatella 68 cm   was 68.2 on 1/4 evaluation   20 cm Proximal to Suprapatella 68 cm   was 68.2   10 cm Proximal to Suprapatella 66 cm   was 67   At Midpatella/Popliteal Crease 48 cm   was 51   30 cm Proximal to Floor at Lateral Plantar Foot 57 cm   was 62.7   20 cm Proximal to Floor at Lateral Plantar Foot 47 cm   was 44.2   10 cm Proximal to Floor at Lateral Malleoli 32 cm   was 34   Circumference of ankle/heel 36 cm.   was 36.6   5 cm Proximal to 1st MTP Joint 27.5 cm   was 27.8   Across MTP Joint 26.5 cm   was 26.6                       [facility] Adult PT Treatment/Exercise - 02/23/21 0001       Manual Therapy   Manual Therapy Manual Lymphatic Drainage (MLD);Compression Bandaging;Other (comment)    Manual therapy comments completed seperate from all other aspects of treatment    Manual Lymphatic Drainage (MLD) bil inguinal-axillary anastomosis including short neck, deep and superficial abdominal nodes BLE; supine anterior, sidelying for posterior    Compression Bandaging to distal bil LE's using  profore system  1/2in foam    Other Manual Therapy measurement                       PT Short Term Goals - 01/07/21 1125       PT SHORT TERM GOAL #1   Title Pt to be I in HEP to assist in increasing the lymphatic circulation    Time 2    Period Weeks    Status New    Target Date 01/21/21      PT SHORT TERM GOAL #2   Title PT to be completing good skin care on a daily basis    Time 2    Period Weeks    Status New      PT SHORT TERM GOAL #3   Title PT to have lost 2-3 cm of fluid from B LE to reduce risk of cellulitis    Time 6    Period Weeks    Status New   Pt will not be starting treatment for 3 weeks   Target Date 02/13/21               PT Long Term Goals - 01/07/21 1127       PT LONG TERM GOAL #1   Title Pt to have lost 5-6  cm from B LE to reduce risk of cellulitis and to be able to fit into shoes and pants    Time 10    Period Weeks    Status New    Target Date 03/06/21      PT LONG TERM GOAL #2   Title PT to have and be using a compression pump    Time 10    Period Weeks    Status New      PT LONG TERM GOAL #3   Title PT to have and to be able to don juxtafit    Time 10    Period Weeks    Status New                   Plan - 02/23/21 1149     Clinical Impression Statement Pt returns today after hospital Admission and bout of COVID.  Pt last appt for lymphedema was on 1/27; she then went to ED on 1/29 due to running out of her pulmonary HTN medications.  [facility] transferred [facility] to [facility] for pulmonary titration and reinitiating of her medications.  After discharge on 2/1 Pt had COVID and has been at home since.   Measured today with noted increase in edema as compared to last measurement.  Discussed ordering bandaging with patient and states her son would have to do this and hopefully he will be able to help her financially as well.  Resumed manual lymph drainage and moisturized LEs well prior to rebandaging using profore compression  system.  Therapist to give pt sheet with specific item numbers from [email or web address] next session.    Personal Factors and Comorbidities Comorbidity 3+;Fitness;Time since onset of injury/illness/exacerbation    Comorbidities HTN, obesity, CHF, COPD, OA, Hx of cellulitis    Examination-Activity Limitations Dressing;Hygiene/Grooming;Locomotion Level;Squat;Stairs    Examination-Participation Restrictions Cleaning;Community Activity;Meal Prep    Stability/Clinical Decision Making Evolving/Moderate complexity    Rehab Potential Good    PT Frequency 3x / week    PT Duration 6 weeks    PT Treatment/Interventions Therapeutic exercise;Manual lymph drainage;Compression bandaging    PT Next Visit Plan Continue total decongestive techniques.  continue with profore system  using foam until short stretch avalable.  give information on ordering bandaging next session.    PT Home Exercise Plan sitting: ankle pumps, LAQ, hip ab/adduction, marching,diaphragmic breathing and lymph squeeze    Consulted and Agree with Plan of Care Patient             Patient will benefit from skilled therapeutic intervention in order to improve the following deficits and impairments:  Obesity, Decreased scar mobility, Increased edema, Decreased skin integrity  Visit Diagnosis: Lymphedema, not elsewhere classified     Problem List Patient Active Problem List   Diagnosis Date Noted   Knee osteoarthritis 12/12/2020   Vision changes 09/02/2020   Depression 09/02/2020   Swelling of lower extremity 08/13/2020   Left paraspinal back pain 05/05/2020   Hearing difficulty of both ears 05/05/2020   Internal hemorrhoid 01/11/2020   External hemorrhoid 01/11/2020   Fibroid, uterine 11/07/2019   Vagina bleeding 29/57/4734   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   Bleeding from the genitourinary system 09/13/2019   Gross hematuria 09/13/2019   Chronic venous stasis 05/07/2019   Urinary incontinence 12/12/2018   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 [place], [place], [address] Phone: [phone]   Fax:  [phone]  Name: [name] MRN: [id number] Date of Birth: [birth year]

## 2021-02-24 ENCOUNTER — Other Ambulatory Visit: Payer: Self-pay | Admitting: *Deleted

## 2021-02-24 NOTE — Patient Outreach (Signed)
Medicaid Managed Care   Nurse Care Manager Note  02/24/2021 Name:  [name] MRN:  [id number] DOB:  1960/01/25  [name] is an 61 y.o. year old female who is a primary patient of Masters, [name], DO.  The [facility] Managed Care Coordination team was consulted for assistance with:    CHF COPD  [name] was given information about Medicaid Managed Care Coordination team services today. [name] Patient agreed to services and verbal consent obtained.  Engaged with patient by telephone for follow up visit in response to provider referral for case management and/or care coordination services.   Assessments/Interventions:  Review of past medical history, allergies, medications, health status, including review of consultants reports, laboratory and other test data, was performed as part of comprehensive evaluation and provision of chronic care management services.  SDOH (Social Determinants of Health) assessments and interventions performed: SDOH Interventions    Flowsheet Row Most Recent Value  SDOH Interventions   Housing Interventions Intervention Not Indicated  Transportation Interventions Intervention Not Indicated       Care Plan  Allergies  Allergen Reactions   Aspirin Swelling    REACTION: airway swelling   Codeine Other (See Comments)    REACTION: tingling in lips and hard breathing - had reaction at dentist - states "I can't take certain kinds of codeine" - happened maybe 10 yr ago   Lisinopril Cough   Sulfonamide Derivatives Swelling    REACTION: airway swelling   Latex Rash    Medications Reviewed Today     Reviewed by Melissa Montane, RN (Registered Nurse) on 02/24/21 at 1326  Med List Status: <None>   Medication Order Taking? Sig Documenting Provider Last Dose Status Informant  ACCU-CHEK AVIVA PLUS test strip [id number] No USE 1 STRIP TO CHECK GLUCOSE ONCE DAILY  Patient not taking: Reported on 10/24/2020   [name], MD Not Taking  Active Self  acetaminophen (TYLENOL) 500 MG tablet [id number] Yes Take 1,000 mg by mouth every 6 (six) hours as needed. [provider] Taking Active   benzonatate (TESSALON) 200 MG capsule [id number] Yes Take 1 capsule (200 mg total) by mouth 3 (three) times daily as needed for cough. Laurin Coder, MD Taking Active Self  carboxymethylcellulose (REFRESH PLUS) 0.5 % SOLN [id number] Yes 1 drop 3 (three) times daily as needed. [provider] Taking Active   cetirizine (EQ ALLERGY RELIEF, CETIRIZINE,) 10 MG tablet [id number] Yes Take 1 tablet (10 mg total) by mouth daily. Masters, [name], DO Taking Active   cycloSPORINE (RESTASIS) 0.05 % ophthalmic emulsion [id number] No Place 1 drop into both eyes 2 (two) times daily as needed (dry eyes).  Patient not taking: Reported on 10/24/2020   [provider] Not Taking Active   diclofenac Sodium (VOLTAREN) 1 % GEL [id number] No Apply 4 g topically 4 (four) times daily.  Patient not taking: Reported on 02/24/2021   Marianna Payment, MD Not Taking Active   diphenhydrAMINE (BENADRYL) 25 MG tablet [id number] Yes Take 50-100 mg by mouth 2 (two) times daily as needed for itching. [provider] Taking Active Self  Ferrous Sulfate (IRON PO) [id number] Yes Take by mouth. [provider] Taking Active   FIBER ADULT GUMMIES PO [id number] Yes Take by mouth. [provider] Taking Active   fluticasone (FLONASE) 50 MCG/ACT nasal spray [id number] Yes Place 1 spray into both nostrils daily as needed for allergies. [name], MD Taking Active Self  gabapentin (NEURONTIN) 300 MG capsule [id number]  Yes TAKE TWO CAPSULES BY MOUTH THREE TIMES A DAY Masters, Katie, DO Taking Active   metFORMIN (GLUCOPHAGE) 500 MG tablet [id number] Yes Take 1/2 tablet by mouth with breakfast Christiana Fuchs, DO Taking Active   OXYGEN [id number] Yes Inhale 4 L into the lungs continuous. [provider] Taking Active Self  pantoprazole  (PROTONIX) 20 MG tablet [id number] Yes Take 1 tablet (20 mg total) by mouth 2 (two) times daily. Masters, Scientist, research (physical sciences), DO Taking Active   potassium chloride SA (KLOR-CON M) 20 MEQ tablet [id number] Yes TAKE TWO TABLETS BY MOUTH TWICE A DAY Christian, Rylee, MD Taking Active   pravastatin (PRAVACHOL) 20 MG tablet [id number] Yes Take 1 tablet (20 mg total) by mouth daily. Masters, [name], DO Taking Active   rivaroxaban (XARELTO) 20 MG TABS tablet [id number] Yes Take 1 tablet (20 mg total) by mouth daily with supper. Masters, Scientist, research (physical sciences), DO Taking Active   sildenafil (REVATIO) 20 MG tablet [id number] Yes TAKE 1 TABLET THREE TIMES A DAY Bensimhon, [name], MD Taking Active   spironolactone (ALDACTONE) 25 MG tablet [id number] Yes Take 1/2 tablet by mouth once daily. Masters, [name], DO Taking Active   torsemide (DEMADEX) 20 MG tablet [id number] Yes Take 4 tablets (80 mg total) by mouth 2 (two) times daily. Masters, Scientist, research (physical sciences), DO Taking Active            Med Note (Lemmie Vanlanen A   Tue Feb 24, 2021  1:24 PM) Taking 43m once daily and extra as instructed by Dr. FGilles Chiquito treprostinil (REMODULIN) 5 MG/ML SOLN injection [id number] See admin instructions. As of 08/09/2019: Add 7 ml of Remodulin to cassette and 93 ml of sterile diluent for Remodulin to cassetet for a total volume of 100 ml to make a concentration of 350,000 ng per ml.  Infuse via a CADD pump intravenously at a rate of 40 ml per 24 hours. Based on a dosing weight of 120 kg, the dose is 81 ng per kg per min.  Once opened, discard Remodulin vial after 30 days.  Sterile Diluent for Remodulin vials are single use only. Once mixed, discard cassette after 48 hours. [provider] Taking Active Self           Med Note (Ralene CorkDec 13, 2021 12:50 PM) Continuous infusion 12/17/19  vitamin B-12 (CYANOCOBALAMIN) 500 MCG tablet [id number] Take 1 tablet (500 mcg total) by mouth daily. 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   Bleeding from the genitourinary system 09/13/2019   Gross hematuria 09/13/2019   Chronic venous stasis 05/07/2019   Urinary incontinence 12/12/2018   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[place]    Right ovarian cyst 05/19/2017   Peripheral neuropathy ([place] 09/27/2014   Healthcare maintenance 09/27/2014   Atrial flutter ([place] 08/17/2012   Right heart failure, NYHA class 3 ([place] 03/15/2012   Allergic rhinitis 11/16/2011   Diabetes ([place] 10/15/2010   Hyperlipidemia 10/15/2010   Obesity 10/14/2010   Obstructive sleep apnea 02/21/2009   GERD  11/29/2005    Conditions to be addressed/monitored per PCP order:  CHF and COPD  Care Plan : RN Care Manager Plan of Care  Updates made by Melissa Montane, RN since 02/24/2021 12:00 AM     Problem: Chronic Disease Management needs in patient with CHF, COPD and Pulmonary HTN   Priority: High     Long-Range Goal: Development of Plan of Care for Chronic Disease Management needs in patient with CHF, COPD and Pulmonary HTN   Start Date: 11/13/2020  Expected End Date: 03/26/2021  Priority: High  Note:   Current Barriers:  Chronic Disease Management support and education needs related to CHF, COPD, and Pulmonary Disease Ms. Goddard was recently admitted after running out of cartridges for her Remodulin and subsequently being +Covid. She is feeling better and has an adequated supply of cartridges. She is attending PT at the [facility]. She needs to call and schedule follow up with PCP, Dr. Haroldine Laws, Dental visit and  Eye exam.    RNCM Clinical Goal(s):  Patient will verbalize basic understanding of CHF, COPD, and Pulmonary Disease disease process and self health management plan as evidenced by attending all scheduled appointments, taking medications as prescribed and contacting PCP with any questions or concerns take all medications exactly as prescribed and will call provider for medication related questions as evidenced by refilling medications prior to running out    attend all scheduled medical appointments: with PCP and Specialist as evidenced by physician notes in EMR        work with pharmacist to address medication management related to CHF, COPD, and Pulmonary Disease as evidenced by review of EMR and patient or pharmacist report    work with community resource care guide to address needs related to Social Isolation as evidenced by patient and/or community resource care guide support    through collaboration with Consulting civil engineer, provider, and care team.   Interventions: Inter-disciplinary care team collaboration (see longitudinal plan of care) Evaluation of current treatment plan related to  self management and patient's adherence to plan as established by provider Provided patient with contact information for [facility] [phone]   Heart Failure Interventions:  (Status: Goal on Track (progressing): YES.)  Long Term Goal  Provided education on low sodium diet Advised patient to weigh each morning after emptying bladder Discussed importance of daily weight and advised patient to weigh and record daily Reviewed role of diuretics in prevention of fluid overload and management of heart failure Discussed the importance of keeping all appointments with provider Assessed social determinant of health barriers Discussed upcoming appointments with [facility] and scheduling follow up with Dr. Haroldine Laws Discussed fluid restrictions, advised patient to limit all fluids to 2 L per  day.   COPD: (Status: Goal on Track (progressing): YES.) Long Term Goal  Reviewed medications with patient, including use of prescribed maintenance and rescue inhalers, and provided instruction on medication management and the importance of adherence Advised patient to track and manage COPD triggers Advised patient to engage in light exercise as tolerated 3-5 days a week to aid in the the management of COPD Provided education about and advised patient to utilize infection prevention strategies to reduce risk of respiratory infection Assessed social determinant of health barriers Discussed calling pharmacy for flonase recently prescribed by Dr. Gilles Chiquito  Patient Goals/Self-Care Activities: call office if I gain more than 2 pounds in one day or 5 pounds in one week keep legs up while sitting track weight in diary use salt  in moderation weigh myself daily eat more whole grains, fruits and vegetables, lean meats and healthy fats - avoid second hand smoke - limit outdoor activity during cold weather       Follow Up:  Patient agrees to Care Plan and Follow-up.  Plan: The Managed Medicaid care management team will reach out to the patient again over the next 30 days.  Date/time of next scheduled RN care management/care coordination outreach:  03/26/21 @ 2:30pm  Faith Joiner RN, BSN [place] RN Care Coordinator

## 2021-02-24 NOTE — Patient Instructions (Signed)
Visit Information  Ms. Bunyan was given information about Medicaid Managed Care team care coordination services as a part of their Healthy [facility] Medicaid benefit. [name] verbally consented to engagement with the [facility] Managed Care team.   If you are experiencing a medical emergency, please call 911 or report to your local emergency department or urgent care.   If you have a non-emergency medical problem during routine business hours, please contact your provider's office and ask to speak with a nurse.   For questions related to your Healthy [facility] health plan, please call: [phone] or visit the homepage here: [email or web address]  If you would like to schedule transportation through your Healthy [facility] plan, please call the following number at least 2 days in advance of your appointment: [phone]  For information about your ride after you set it up, call Ride Assist at [phone]. Use this number to activate a Will Call pickup, or if your transportation is late for a scheduled pickup. Use this number, too, if you need to make a change or cancel a previously scheduled reservation.  If you need transportation services right away, call [phone]. The after-hours call center is staffed 24 hours to handle ride assistance and urgent reservation requests (including discharges) 365 days a year. Urgent trips include sick visits, hospital discharge requests and life-sustaining treatment.  Call the [place] at [phone], at any time, 24 hours a day, 7 days a week. If you are in danger or need immediate medical attention call 911.  If you would like help to quit smoking, call 1-800-QUIT-NOW (832) 742-5998) OR Espaol: 1-855-Djelo-Ya (1-517-616-0737) o para ms informacin haga clic aqu or Text READY to 200-400 to register via text  [name] - following are the goals we discussed in your visit today:   Goals  Addressed   None     Please see education materials related to low sodium and fluid restrictions provided as print materials.   The patient verbalized understanding of instructions, educational materials, and care plan provided today and agreed to receive a mailed copy of patient instructions, educational materials, and care plan.   Telephone follow up appointment with Managed Medicaid care management team member scheduled for:03/26/21 @ 2:30pm  [name] RN, BSN [place] RN Care Coordinator   Following is a copy of your plan of care:  Care Plan : RN Care Manager Plan of Care  Updates made by Melissa Montane, RN since 02/24/2021 12:00 AM     Problem: Chronic Disease Management needs in patient with CHF, COPD and Pulmonary HTN   Priority: High     Long-Range Goal: Development of Plan of Care for Chronic Disease Management needs in patient with CHF, COPD and Pulmonary HTN   Start Date: 11/13/2020  Expected End Date: 03/26/2021  Priority: High  Note:   Current Barriers:  Chronic Disease Management support and education needs related to CHF, COPD, and Pulmonary Disease Ms. Shands was recently admitted after running out of cartridges for her Remodulin and subsequently being +Covid. She is feeling better and has an adequated supply of cartridges. She is attending PT at the [facility]. She needs to call and schedule follow up with PCP, Dr. Haroldine Laws, Dental visit and Eye exam.    RNCM Clinical Goal(s):  Patient will verbalize basic understanding of CHF, COPD, and Pulmonary Disease disease process and self health management plan as evidenced by attending all scheduled appointments, taking medications as prescribed and contacting  PCP with any questions or concerns take all medications exactly as prescribed and will call provider for medication related questions as evidenced by refilling medications prior to running out    attend all scheduled medical  appointments: with PCP and Specialist as evidenced by physician notes in EMR        work with pharmacist to address medication management related to CHF, COPD, and Pulmonary Disease as evidenced by review of EMR and patient or pharmacist report    work with community resource care guide to address needs related to Social Isolation as evidenced by patient and/or community resource care guide support    through collaboration with Consulting civil engineer, provider, and care team.   Interventions: Inter-disciplinary care team collaboration (see longitudinal plan of care) Evaluation of current treatment plan related to  self management and patient's adherence to plan as established by provider Provided patient with contact information for [facility] [phone]   Heart Failure Interventions:  (Status: Goal on Track (progressing): YES.)  Long Term Goal  Provided education on low sodium diet Advised patient to weigh each morning after emptying bladder Discussed importance of daily weight and advised patient to weigh and record daily Reviewed role of diuretics in prevention of fluid overload and management of heart failure Discussed the importance of keeping all appointments with provider Assessed social determinant of health barriers Discussed upcoming appointments with [facility] and scheduling follow up with Dr. Haroldine Laws Discussed fluid restrictions, advised patient to limit all fluids to 2 L per day.   COPD: (Status: Goal on Track (progressing): YES.) Long Term Goal  Reviewed medications with patient, including use of prescribed maintenance and rescue inhalers, and provided instruction on medication management and the importance of adherence Advised patient to track and manage COPD triggers Advised patient to engage in light exercise as tolerated 3-5 days a week to aid in the the management of COPD Provided education about and advised patient to utilize infection prevention strategies  to reduce risk of respiratory infection Assessed social determinant of health barriers Discussed calling pharmacy for flonase recently prescribed by Dr. Gilles Chiquito  Patient Goals/Self-Care Activities: call office if I gain more than 2 pounds in one day or 5 pounds in one week keep legs up while sitting track weight in diary use salt in moderation weigh myself daily eat more whole grains, fruits and vegetables, lean meats and healthy fats - avoid second hand smoke - limit outdoor activity during cold weather

## 2021-02-25 ENCOUNTER — Other Ambulatory Visit: Payer: Self-pay | Admitting: Internal Medicine

## 2021-02-25 DIAGNOSIS — G629 Polyneuropathy, unspecified: Secondary | ICD-10-CM

## 2021-02-26 ENCOUNTER — Encounter (HOSPITAL_COMMUNITY): Payer: Self-pay

## 2021-02-26 ENCOUNTER — Other Ambulatory Visit: Payer: Self-pay

## 2021-02-26 ENCOUNTER — Ambulatory Visit (HOSPITAL_COMMUNITY): Payer: Medicaid Other

## 2021-02-26 DIAGNOSIS — I890 Lymphedema, not elsewhere classified: Secondary | ICD-10-CM

## 2021-02-26 NOTE — Therapy (Signed)
[facility] [address] [place], [place], [address] Phone: [phone]   Fax:  [phone]  Physical Therapy Treatment  Patient Details  Name: [name] MRN: [id number] Date of Birth: 1960/01/13 Referring Provider (PT): Georjean Mode   Encounter Date: 02/26/2021   PT End of Session - 02/26/21 1555     Visit Number 6    Number of Visits 18    Date for PT Re-Evaluation 03/13/21    Authorization Type Healthy blue    Authorization Time Period 15 visits approved 1/23-->02/27/21    Authorization - Visit Number 5    Authorization - Number of Visits 15    PT Start Time 1452   bathroom break at entrance, delayed starting   PT Stop Time 1550    PT Time Calculation (min) 58 min    Activity Tolerance Patient tolerated treatment well    Behavior During Therapy [facility] for tasks assessed/performed             Past Medical History:  Diagnosis Date   Acute on chronic diastolic (congestive) heart failure ([place]) 06/25/2013   Acute on chronic respiratory failure ([place]) 06/15/2018   Anemia, iron deficiency    secondary to menhorrhagia, on oral iron, also b12 def, getting monthly b12 shots   Cellulitis 05/2019   RIGHT LOWER EXTREMITY   CHF (congestive heart failure) (HCC)    Chronic cough    secondary to alleriges and post nasal drip   Cognitive impairment    COPD (chronic obstructive pulmonary disease) ([place])    Cor pulmonale (HCC)    PA Peak pressure 1mHg   Depression    Diabetes mellitus    well controlled on metformin   Diastolic heart failure (HCC)    GERD (gastroesophageal reflux disease)    H/O mental retardation    Herpes    Hyperlipidemia    Hypertension    OSA (obstructive sleep apnea)    CPAP   Pulmonary hypertension ([place]    Renal disorder    Restrictive lung disease    PFTs 06/2012 (FVC 54% predicted and FEV1 68% predicted w minimal bronchodilator response).   Shortness of breath    Venous stasis ulcer ([place]     chornic, ?followed up at wound care center, multiple courses of antibiotics in past for cellulitis, on lasix    Past Surgical History:  Procedure Laterality Date   CARDIAC CATHETERIZATION N/A 01/13/2016   Procedure: Right Heart Cath;  Surgeon: DJolaine Artist MD;  [facility];  Service: Cardiovascular;  Laterality: N/A;   CHOLECYSTECTOMY     COLONOSCOPY WITH PROPOFOL N/A 12/20/2019   Procedure: COLONOSCOPY WITH PROPOFOL;  Surgeon: JMilus Banister MD;  [facility];  Service: Endoscopy;  Laterality: N/A;   IR FLUORO GUIDE CV LINE LEFT  11/16/2019   IR FLUORO GUIDE CV LINE LEFT  06/17/2020   IR FLUORO GUIDE CV LINE LEFT  07/04/2020   IR FLUORO GUIDE CV LINE RIGHT  05/09/2019   IR FLUORO GUIDE CV LINE RIGHT  08/12/2019   IR REMOVAL TUN CV CATH W/O FL  05/05/2019   IR [place] VASC ACCESS LEFT  11/16/2019   IR [place] VASC ACCESS LEFT  06/17/2020   IR [place] VASC ACCESS LEFT  07/04/2020   IR [place] VASC ACCESS RIGHT  05/09/2019   IR [place] VASC ACCESS RIGHT  08/12/2019   IR [place] VASC ACCESS RIGHT  11/16/2019   IR VENO/JUGULAR  RIGHT  11/16/2019   LEFT AND RIGHT HEART CATHETERIZATION WITH CORONARY ANGIOGRAM N/A 02/28/2014   Procedure: LEFT AND RIGHT HEART CATHETERIZATION WITH CORONARY ANGIOGRAM;  Surgeon: Jolaine Artist, MD;  [facility] C[facility] LAB;  Service: Cardiovascular;  Laterality: N/A;   RIGHT HEART CATH N/A 06/27/2018   Procedure: RIGHT HEART CATH;  Surgeon: Jolaine Artist, MD;  Location: [facility] CV LAB;  Service: Cardiovascular;  Laterality: N/A;   RIGHT/LEFT HEART CATH AND CORONARY ANGIOGRAPHY N/A 05/14/2019   Procedure: RIGHT/LEFT HEART CATH AND CORONARY ANGIOGRAPHY;  Surgeon: Jolaine Artist, MD;  Location: [facility] CV LAB;  Service: Cardiovascular;  Laterality: N/A;    There were no vitals filed for this visit.   Subjective Assessment - 02/26/21 1451     Subjective Pt stated she had to remove dressings Wednesday morning due to  itching., thinks she may be allergic to liner used.  No pain today.    Pertinent History Htn, CHF, COPD, OA, RT LE cellulitis, obesity.    Patient Stated Goals legs to be smaller    Currently in Pain? No/denies                               [facility] Adult PT Treatment/Exercise - 02/26/21 0001       Manual Therapy   Manual Therapy Manual Lymphatic Drainage (MLD);Compression Bandaging;Other (comment)    Manual therapy comments completed seperate from all other aspects of treatment    Manual Lymphatic Drainage (MLD) bil inguinal-axillary anastomosis including short neck, deep and superficial abdominal nodes BLE; supine anterior, sidelying for posterior    Compression Bandaging to distal bil LE's using profore system 1/2in foam    Other Manual Therapy Given list of short stretch and foot cover for purchase                     PT Education - 02/26/21 1554     Education Details Educated on importance of skin integrity and importance of not scratching as well as avoid getting dressings wet.    Person(s) Educated Patient    Methods Explanation    Comprehension Verbalized understanding;Need further instruction              PT Short Term Goals - 01/07/21 1125       PT SHORT TERM GOAL #1   Title Pt to be I in HEP to assist in increasing the lymphatic circulation    Time 2    Period Weeks    Status New    Target Date 01/21/21      PT SHORT TERM GOAL #2   Title PT to be completing good skin care on a daily basis    Time 2    Period Weeks    Status New      PT SHORT TERM GOAL #3   Title PT to have lost 2-3 cm of fluid from B LE to reduce risk of cellulitis    Time 6    Period Weeks    Status New   Pt will not be starting treatment for 3 weeks   Target Date 02/13/21               PT Long Term Goals - 01/07/21 1127       PT LONG TERM GOAL #1   Title Pt to have lost 5-6  cm from B LE to reduce risk of cellulitis and to be  able to fit into  shoes and pants    Time 10    Period Weeks    Status New    Target Date 03/06/21      PT LONG TERM GOAL #2   Title PT to have and be using a compression pump    Time 10    Period Weeks    Status New      PT LONG TERM GOAL #3   Title PT to have and to be able to don juxtafit    Time 10    Period Weeks    Status New                   Plan - 02/26/21 1556     Clinical Impression Statement Pt educated on importance of skin integrity to reduce risk of wounds with scratching as well as getting dressing wet, verbalized understanding.  Pt given list of lymphedema products to purchase bandages.  Pt forgot foam this session, encouraged to bring wiht next session.  Manual lymphedema decognestive technqiues complete anterior and posterior in sidelying.  Vaseline then heavy layer of lotion applied prior application of profore BLE.  Reports of comfort at EOS.    Personal Factors and Comorbidities Comorbidity 3+;Fitness;Time since onset of injury/illness/exacerbation    Comorbidities HTN, obesity, CHF, COPD, OA, Hx of cellulitis    Examination-Activity Limitations Dressing;Hygiene/Grooming;Locomotion Level;Squat;Stairs    Examination-Participation Restrictions Cleaning;Community Activity;Meal Prep    Stability/Clinical Decision Making Evolving/Moderate complexity    Clinical Decision Making Moderate    Rehab Potential Good    PT Frequency 3x / week    PT Duration 6 weeks    PT Treatment/Interventions Therapeutic exercise;Manual lymph drainage;Compression bandaging    PT Next Visit Plan Request more apts with healty blue next session.  Continue total decongestive techniques.  continue with profore system using foam until short stretch avalable.  give information on ordering bandaging next session.    PT Home Exercise Plan sitting: ankle pumps, LAQ, hip ab/adduction, marching,diaphragmic breathing and lymph squeeze    Consulted and Agree with Plan of Care Patient              Patient will benefit from skilled therapeutic intervention in order to improve the following deficits and impairments:  Obesity, Decreased scar mobility, Increased edema, Decreased skin integrity  Visit Diagnosis: Lymphedema, not elsewhere classified     Problem List Patient Active Problem List   Diagnosis Date Noted   Knee osteoarthritis 12/12/2020   Vision changes 09/02/2020   Depression 09/02/2020   Swelling of lower extremity 08/13/2020   Left paraspinal back pain 05/05/2020   Hearing difficulty of both ears 05/05/2020   Internal hemorrhoid 01/11/2020   External hemorrhoid 01/11/2020   Fibroid, uterine 11/07/2019   Vagina bleeding 38/10/1749   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   Bleeding from the genitourinary system 09/13/2019   Gross hematuria 09/13/2019   Chronic venous stasis 05/07/2019   Urinary incontinence 12/12/2018   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[place])    Right ovarian cyst 05/19/2017   Peripheral neuropathy ([place]) 09/27/2014   Healthcare maintenance 09/27/2014   Atrial flutter ([place]) 08/17/2012   Right heart failure, NYHA class 3 ([place]) 03/15/2012   Allergic rhinitis 11/16/2011   Diabetes ([place]) 10/15/2010   Hyperlipidemia 10/15/2010   Obesity 10/14/2010   Obstructive sleep apnea 02/21/2009   GERD 11/29/2005   Ihor Austin, LPTA/CLT; CBIS [phone]  Aldona Lento, PTA 02/26/2021, 4:03 PM  [facility]  [place] [facility] [address] [place], [place], [address] Phone: [phone]   Fax:  [phone]  Name: JOLIET MALLOZZI MRN: [id number] Date of Birth: [birth year]

## 2021-02-27 ENCOUNTER — Encounter (HOSPITAL_COMMUNITY): Payer: Self-pay | Admitting: Physical Therapy

## 2021-02-27 ENCOUNTER — Ambulatory Visit (HOSPITAL_COMMUNITY): Payer: Medicaid Other | Admitting: Physical Therapy

## 2021-02-27 DIAGNOSIS — I890 Lymphedema, not elsewhere classified: Secondary | ICD-10-CM | POA: Diagnosis not present

## 2021-02-27 NOTE — Therapy (Signed)
[facility] [place], [place], [address] Phone: [phone]   Fax:  [phone]  Physical Therapy Treatment  Patient Details  Name: [name] MRN: [id number] Date of Birth: February 16, 1960 Referring Provider (PT): Georjean Mode   Encounter Date: 02/27/2021   PT End of Session - 02/27/21 1035     Visit Number 7    Number of Visits 18    Date for PT Re-Evaluation 03/13/21    Authorization Type Healthy blue    Authorization Time Period 15 visits approved 1/23-->02/27/21 ; requested 12 visits from 2/27 thru 3/24.    Authorization - Visit Number 5    Authorization - Number of Visits 15    PT Start Time 0920    PT Stop Time 1028    PT Time Calculation (min) 68 min    Activity Tolerance Patient tolerated treatment well    Behavior During Therapy WFL for tasks assessed/performed             Past Medical History:  Diagnosis Date   Acute on chronic diastolic (congestive) heart failure ([place]) 06/25/2013   Acute on chronic respiratory failure ([place]) 06/15/2018   Anemia, iron deficiency    secondary to menhorrhagia, on oral iron, also b12 def, getting monthly b12 shots   Cellulitis 05/2019   RIGHT LOWER EXTREMITY   CHF (congestive heart failure) (HCC)    Chronic cough    secondary to alleriges and post nasal drip   Cognitive impairment    COPD (chronic obstructive pulmonary disease) (HCC)    Cor pulmonale (HCC)    PA Peak pressure 44mHg   Depression    Diabetes mellitus    well controlled on metformin   Diastolic heart failure (HCC)    GERD (gastroesophageal reflux disease)    H/O mental retardation    Herpes    Hyperlipidemia    Hypertension    OSA (obstructive sleep apnea)    CPAP   Pulmonary hypertension ([place]    Renal disorder    Restrictive lung disease    PFTs 06/2012 (FVC 54% predicted and FEV1 68% predicted w minimal bronchodilator response).   Shortness of breath    Venous stasis ulcer ([place]     chornic, ?followed up at wound care center, multiple courses of antibiotics in past for cellulitis, on lasix    Past Surgical History:  Procedure Laterality Date   CARDIAC CATHETERIZATION N/A 01/13/2016   Procedure: Right Heart Cath;  Surgeon: DJolaine Artist MD;  [facility];  Service: Cardiovascular;  Laterality: N/A;   CHOLECYSTECTOMY     COLONOSCOPY WITH PROPOFOL N/A 12/20/2019   Procedure: COLONOSCOPY WITH PROPOFOL;  Surgeon: JMilus Banister MD;  [facility];  Service: Endoscopy;  Laterality: N/A;   IR FLUORO GUIDE CV LINE LEFT  11/16/2019   IR FLUORO GUIDE CV LINE LEFT  06/17/2020   IR FLUORO GUIDE CV LINE LEFT  07/04/2020   IR FLUORO GUIDE CV LINE RIGHT  05/09/2019   IR FLUORO GUIDE CV LINE RIGHT  08/12/2019   IR REMOVAL TUN CV CATH W/O FL  05/05/2019   IR [place] VASC ACCESS LEFT  11/16/2019   IR [place] VASC ACCESS LEFT  06/17/2020   IR [place]IDE VASC ACCESS LEFT  07/04/2020   IR [place] VASC ACCESS RIGHT  05/09/2019   IR [place] VASC ACCESS RIGHT  08/12/2019   IR [place] VASC ACCESS RIGHT  11/16/2019   IR  VENO/JUGULAR RIGHT  11/16/2019   LEFT AND RIGHT HEART CATHETERIZATION WITH CORONARY ANGIOGRAM N/A 02/28/2014   Procedure: LEFT AND RIGHT HEART CATHETERIZATION WITH CORONARY ANGIOGRAM;  Surgeon: Jolaine Artist, MD;  Location: [facility] CATH LAB;  Service: Cardiovascular;  Laterality: N/A;   RIGHT HEART CATH N/A 06/27/2018   Procedure: RIGHT HEART CATH;  Surgeon: Jolaine Artist, MD;  Location: [place] CV LAB;  Service: Cardiovascular;  Laterality: N/A;   RIGHT/LEFT HEART CATH AND CORONARY ANGIOGRAPHY N/A 05/14/2019   Procedure: RIGHT/LEFT HEART CATH AND CORONARY ANGIOGRAPHY;  Surgeon: Jolaine Artist, MD;  Location: [place] CV LAB;  Service: Cardiovascular;  Laterality: N/A;    There were no vitals filed for this visit.   Subjective Assessment - 02/27/21 1031     Subjective Pt states that she is going to try and buy her bandages this weekend.   She states itching was better but still had some.    Pertinent History Htn, CHF, COPD, OA, RT LE cellulitis, obesity.    Limitations Lifting;Standing;Walking;House hold activities   not only due to lymphedema but CHF and COPD   How long can you sit comfortably? no problem    How long can you stand comfortably? less than five minutes    How long can you walk comfortably? Walks with a rollator ambulates for less than 65 feet at a time.    Patient Stated Goals legs to be smaller                   LYMPHEDEMA/ONCOLOGY QUESTIONNAIRE - 02/27/21 0001       Lymphedema Stage   Stage STAGE 2 SPONTANEOUSLY IRREVERSIBLE      Lymphedema Assessments   Lymphedema Assessments Lower extremities      Right Lower Extremity Lymphedema   30 cm Proximal to Suprapatella 70 cm   no measurement taken on 1/4   20 cm Proximal to Suprapatella 70 cm   was 70.3 on 1/4 evaluation   10 cm Proximal to Suprapatella 68 cm   was 68.5   At Midpatella/Popliteal Crease 50 cm   was 54.5   30 cm Proximal to Floor at Lateral Plantar Foot 55 cm   was 60.2   20 cm Proximal to Floor at Lateral Plantar Foot 50 1   was 46.8   10 cm Proximal to Floor at Lateral Malleoli 31.5 cm   was 33.5   Circumference of ankle/heel 36 cm.   was 37   5 cm Proximal to 1st MTP Joint 27 cm   was 27.5   Across MTP Joint 25.5 cm   was 25.5     Left Lower Extremity Lymphedema   30 cm Proximal to Suprapatella 68 cm   was 68.2 on 1/4 evaluation   20 cm Proximal to Suprapatella 68 cm   was 68.2   10 cm Proximal to Suprapatella 66 cm   was 67   At Midpatella/Popliteal Crease 48 cm   was 51   30 cm Proximal to Floor at Lateral Plantar Foot 57 cm   was 62.7   20 cm Proximal to Floor at Lateral Plantar Foot 47 cm   was 44.2   10 cm Proximal to Floor at Lateral Malleoli 32 cm   was 34   Circumference of ankle/heel 36 cm.   was 36.6   5 cm Proximal to 1st MTP Joint 27.5 cm   was 27.8   Across MTP Joint 26.5 cm   was 26.6  [facility] Adult PT Treatment/Exercise - 02/27/21 0001       Manual Therapy   Manual Therapy Manual Lymphatic Drainage (MLD);Compression Bandaging;Other (comment)    Manual therapy comments completed seperate from all other aspects of treatment    Manual Lymphatic Drainage (MLD) supracalvicular, deep and superfical abdominal inguinal/axillary anastomosis f/b B LE    Compression Bandaging to distal bil LE's using profore system 1/2in foam                     PT Education - 02/27/21 1034     Education Details Pt not completing her exercises urged pt to do so; if itching continues bring some hydrocortisone cream in next treatment and we will use prior to bandaging    Person(s) Educated Patient    Methods Explanation              PT Short Term Goals - 02/27/21 1047       PT SHORT TERM GOAL #1   Title Pt to be I in HEP to assist in increasing the lymphatic circulation    Time 2    Period Weeks    Status On-going    Target Date 01/21/21      PT SHORT TERM GOAL #2   Title PT to be completing good skin care on a daily basis    Time 2    Period Weeks    Status Partially Met      PT SHORT TERM GOAL #3   Title PT to have lost 2-3 cm of fluid from B LE to reduce risk of cellulitis    Time 6    Period Weeks    Status Partially Met   Pt will not be starting treatment for 3 weeks   Target Date 02/13/21               PT Long Term Goals - 02/27/21 1047       PT LONG TERM GOAL #1   Title Pt to have lost 5-6  cm from B LE to reduce risk of cellulitis and to be able to fit into shoes and pants    Time 10    Period Weeks    Status On-going    Target Date 03/06/21      PT LONG TERM GOAL #2   Title PT to have and be using a compression pump    Time 10    Period Weeks    Status Not Met      PT LONG TERM GOAL #3   Title PT to have and to be able to don juxtafit    Time 10    Period Weeks    Status Not Met                    Plan - 02/27/21 1043     Clinical Impression Statement Pt has had an interruption of therapy due to having covid.  She was not able to be seen from 1/27 thru 2/20 due to illness.  Even so pt has made good reduction in LE edema and weeping has stopped.  PT continues to have significant redness in B LE which appears to be more irritation than infection.  PT will continue to benefit from skilled PT to maximize reduction of fluid to reduce risk of cellulitis.    Personal Factors and Comorbidities Comorbidity 3+;Fitness;Time since onset of injury/illness/exacerbation    Comorbidities HTN, obesity, CHF, COPD, OA, Hx of cellulitis  Examination-Activity Limitations Dressing;Hygiene/Grooming;Locomotion Level;Squat;Stairs    Examination-Participation Restrictions Cleaning;Community Activity;Meal Prep    Stability/Clinical Decision Making Evolving/Moderate complexity    Rehab Potential Good    PT Frequency 3x / week    PT Duration 6 weeks    PT Treatment/Interventions Therapeutic exercise;Manual lymph drainage;Compression bandaging    PT Next Visit Plan .  Continue total decongestive techniques.  continue with profore system using foam until short stretch avalable.    PT Home Exercise Plan sitting: ankle pumps, LAQ, hip ab/adduction, marching,diaphragmic breathing and lymph squeeze    Consulted and Agree with Plan of Care Patient             Patient will benefit from skilled therapeutic intervention in order to improve the following deficits and impairments:  Obesity, Decreased scar mobility, Increased edema, Decreased skin integrity  Visit Diagnosis: Lymphedema, not elsewhere classified     Problem List Patient Active Problem List   Diagnosis Date Noted   Knee osteoarthritis 12/12/2020   Vision changes 09/02/2020   Depression 09/02/2020   Swelling of lower extremity 08/13/2020   Left paraspinal back pain 05/05/2020   Hearing difficulty of both ears 05/05/2020   Internal hemorrhoid  01/11/2020   External hemorrhoid 01/11/2020   Fibroid, uterine 11/07/2019   Vagina bleeding 01/07/457   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   Bleeding from the genitourinary system 09/13/2019   Gross hematuria 09/13/2019   Chronic venous stasis 05/07/2019   Urinary incontinence 12/12/2018   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 [place], [place], [address] Phone: [phone]   Fax:  [phone]  Name: MAHALEY SCHWERING MRN: [id number] Date of Birth: 12/03/1960

## 2021-03-02 ENCOUNTER — Ambulatory Visit (HOSPITAL_COMMUNITY): Payer: Medicaid Other | Admitting: Physical Therapy

## 2021-03-02 ENCOUNTER — Other Ambulatory Visit: Payer: Self-pay

## 2021-03-02 DIAGNOSIS — I890 Lymphedema, not elsewhere classified: Secondary | ICD-10-CM | POA: Diagnosis not present

## 2021-03-02 NOTE — Therapy (Signed)
[facility] [address] [place], [place], [address] Phone: [phone]   Fax:  [phone]  Physical Therapy Treatment  Patient Details  Name: [name] MRN: [id number] Date of Birth: 1960/08/31 Referring Provider (PT): Georjean Mode   Encounter Date: 03/02/2021   PT End of Session - 03/02/21 1605     Visit Number 8    Number of Visits 18    Date for PT Re-Evaluation 03/13/21    Authorization Type Healthy blue    Authorization Time Period 15 visits approved 1/23-->02/27/21; date extension requested    Authorization - Visit Number 7    Authorization - Number of Visits 15    PT Start Time 6389    PT Stop Time 1603    PT Time Calculation (min) 67 min    Activity Tolerance Patient tolerated treatment well    Behavior During Therapy [facility] for tasks assessed/performed             Past Medical History:  Diagnosis Date   Acute on chronic diastolic (congestive) heart failure ([place]) 06/25/2013   Acute on chronic respiratory failure ([place]) 06/15/2018   Anemia, iron deficiency    secondary to menhorrhagia, on oral iron, also b12 def, getting monthly b12 shots   Cellulitis 05/2019   RIGHT LOWER EXTREMITY   CHF (congestive heart failure) (HCC)    Chronic cough    secondary to alleriges and post nasal drip   Cognitive impairment    COPD (chronic obstructive pulmonary disease) ([place])    Cor pulmonale (HCC)    PA Peak pressure 67mHg   Depression    Diabetes mellitus    well controlled on metformin   Diastolic heart failure (HCC)    GERD (gastroesophageal reflux disease)    H/O mental retardation    Herpes    Hyperlipidemia    Hypertension    OSA (obstructive sleep apnea)    CPAP   Pulmonary hypertension ([place] [employer]    Renal disorder    Restrictive lung disease    PFTs 06/2012 (FVC 54% predicted and FEV1 68% predicted w minimal bronchodilator response).   Shortness of breath    Venous stasis ulcer ([place]    chornic, ?followed up  at wound care center, multiple courses of antibiotics in past for cellulitis, on lasix    Past Surgical History:  Procedure Laterality Date   CARDIAC CATHETERIZATION N/A 01/13/2016   Procedure: Right Heart Cath;  Surgeon: DJolaine Artist MD;  [facility];  Service: Cardiovascular;  Laterality: N/A;   CHOLECYSTECTOMY     COLONOSCOPY WITH PROPOFOL N/A 12/20/2019   Procedure: COLONOSCOPY WITH PROPOFOL;  Surgeon: JMilus Banister MD;  [facility];  Service: Endoscopy;  Laterality: N/A;   IR FLUORO GUIDE CV LINE LEFT  11/16/2019   IR FLUORO GUIDE CV LINE LEFT  06/17/2020   IR FLUORO GUIDE CV LINE LEFT  07/04/2020   IR FLUORO GUIDE CV LINE RIGHT  05/09/2019   IR FLUORO GUIDE CV LINE RIGHT  08/12/2019   IR REMOVAL TUN CV CATH W/O FL  05/05/2019   IR [place] VASC ACCESS LEFT  11/16/2019   IR [place] VASC ACCESS LEFT  06/17/2020   IR [place] VASC ACCESS LEFT  07/04/2020   IR [place] VASC ACCESS RIGHT  05/09/2019   IR [place] VASC ACCESS RIGHT  08/12/2019   IR [place] VASC ACCESS RIGHT  11/16/2019   IR VENO/JUGULAR RIGHT  11/16/2019  LEFT AND RIGHT HEART CATHETERIZATION WITH CORONARY ANGIOGRAM N/A 02/28/2014   Procedure: LEFT AND RIGHT HEART CATHETERIZATION WITH CORONARY ANGIOGRAM;  Surgeon: Jolaine Artist, MD;  [facility] C[facility] LAB;  Service: Cardiovascular;  Laterality: N/A;   RIGHT HEART CATH N/A 06/27/2018   Procedure: RIGHT HEART CATH;  Surgeon: Jolaine Artist, MD;  Location: [facility] CV LAB;  Service: Cardiovascular;  Laterality: N/A;   RIGHT/LEFT HEART CATH AND CORONARY ANGIOGRAPHY N/A 05/14/2019   Procedure: RIGHT/LEFT HEART CATH AND CORONARY ANGIOGRAPHY;  Surgeon: Jolaine Artist, MD;  Location: [facility] CV LAB;  Service: Cardiovascular;  Laterality: N/A;    There were no vitals filed for this visit.   Subjective Assessment - 03/02/21 1602     Subjective Pt did not have a chance to order her bandages.  States that the itching is much better she  did not bring any hydrocortisone cream in.  Pt states she has not been good about doing her exercises.  Pt states that she got her compression pump this weekend    Pertinent History Htn, CHF, COPD, OA, RT LE cellulitis, obesity.    Limitations Lifting;Standing;Walking;House hold activities    How long can you sit comfortably? no problem    How long can you stand comfortably? less than five minutes    How long can you walk comfortably? Walks with a rollator ambulates for less than 65 feet at a time.    Currently in Pain? No/denies                               [facility] Adult PT Treatment/Exercise - 03/02/21 0001       Exercises   Exercises Knee/Hip   sitting: ankle pumps, LAQ, hip ab/adduction, marching,diaphragmic breathing and lymph squeeze     Manual Therapy   Manual Therapy Manual Lymphatic Drainage (MLD);Compression Bandaging;Other (comment)    Manual therapy comments completed seperate from all other aspects of treatment    Manual Lymphatic Drainage (MLD) supracalvicular, deep and superfical abdominal inguinal/axillary anastomosis f/b B LE; posterior completed in sidelying position    Compression Bandaging to distal bil LE's using profore system 1/2in foam                       PT Short Term Goals - 02/27/21 1047       PT SHORT TERM GOAL #1   Title Pt to be I in HEP to assist in increasing the lymphatic circulation    Time 2    Period Weeks    Status On-going    Target Date 01/21/21      PT SHORT TERM GOAL #2   Title PT to be completing good skin care on a daily basis    Time 2    Period Weeks    Status Partially Met      PT SHORT TERM GOAL #3   Title PT to have lost 2-3 cm of fluid from B LE to reduce risk of cellulitis    Time 6    Period Weeks    Status Partially Met   Pt will not be starting treatment for 3 weeks   Target Date 02/13/21               PT Long Term Goals - 02/27/21 1047       PT LONG TERM GOAL #1   Title Pt  to have lost 5-6  cm  from B LE to reduce risk of cellulitis and to be able to fit into shoes and pants    Time 10    Period Weeks    Status On-going    Target Date 03/06/21      PT LONG TERM GOAL #2   Title PT to have and be using a compression pump    Time 10    Period Weeks    Status Not Met      PT LONG TERM GOAL #3   Title PT to have and to be able to don juxtafit    Time 10    Period Weeks    Status Not Met                   Plan - 03/02/21 1607     Clinical Impression Statement Pt legs continue to decrease in volume with noted decreased induration.  Posterior manual done in sidelying position.  Therapist reviewed exercises with pt as she states that she has not been completing them.  Therapist suggested putting exercise sheet on a table where she will see them to remind her to complete them at least daily.    Personal Factors and Comorbidities Comorbidity 3+;Fitness;Time since onset of injury/illness/exacerbation    Comorbidities HTN, obesity, CHF, COPD, OA, Hx of cellulitis    Examination-Activity Limitations Dressing;Hygiene/Grooming;Locomotion Level;Squat;Stairs    Examination-Participation Restrictions Cleaning;Community Activity;Meal Prep    Stability/Clinical Decision Making Evolving/Moderate complexity    Rehab Potential Good    PT Frequency 3x / week    PT Duration 6 weeks    PT Treatment/Interventions Therapeutic exercise;Manual lymph drainage;Compression bandaging    PT Next Visit Plan .  Continue total decongestive techniques.  continue with profore system using foam until short stretch avalable.  Measure on Wed.    PT Home Exercise Plan sitting: ankle pumps, LAQ, hip ab/adduction, marching,diaphragmic breathing and lymph squeeze    Consulted and Agree with Plan of Care Patient             Patient will benefit from skilled therapeutic intervention in order to improve the following deficits and impairments:  Obesity, Decreased scar mobility,  Increased edema, Decreased skin integrity  Visit Diagnosis: Lymphedema, not elsewhere classified     Problem List Patient Active Problem List   Diagnosis Date Noted   Knee osteoarthritis 12/12/2020   Vision changes 09/02/2020   Depression 09/02/2020   Swelling of lower extremity 08/13/2020   Left paraspinal back pain 05/05/2020   Hearing difficulty of both ears 05/05/2020   Internal hemorrhoid 01/11/2020   External hemorrhoid 01/11/2020   Fibroid, uterine 11/07/2019   Vagina bleeding 51/88/4166   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   Bleeding from the genitourinary system 09/13/2019   Gross hematuria 09/13/2019   Chronic venous stasis 05/07/2019   Urinary incontinence 12/12/2018   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[place])    Right ovarian cyst 05/19/2017   Peripheral neuropathy ([place]) 09/27/2014   Healthcare maintenance 09/27/2014   Atrial flutter ([place]) 08/17/2012   Right heart failure, NYHA class 3 ([place]) 03/15/2012   Allergic rhinitis 11/16/2011   Diabetes ([place]) 10/15/2010   Hyperlipidemia 10/15/2010   Obesity 10/14/2010   Obstructive sleep apnea 02/21/2009   GERD 11/29/2005   Rayetta Humphrey, PT CLT [phone]  03/02/2021, 4:10 PM  [facility] [address] [place], [place], [address] Phone: [phone]   Fax:  [phone]  Name: [name] MRN: [id number] Date of Birth:  Dec 10, 1960

## 2021-03-04 ENCOUNTER — Other Ambulatory Visit: Payer: Self-pay

## 2021-03-04 ENCOUNTER — Ambulatory Visit (HOSPITAL_COMMUNITY): Payer: Medicaid Other | Attending: Internal Medicine

## 2021-03-04 ENCOUNTER — Encounter (HOSPITAL_COMMUNITY): Payer: Self-pay

## 2021-03-04 DIAGNOSIS — I890 Lymphedema, not elsewhere classified: Secondary | ICD-10-CM | POA: Diagnosis not present

## 2021-03-04 DIAGNOSIS — J984 Other disorders of lung: Secondary | ICD-10-CM | POA: Diagnosis not present

## 2021-03-04 DIAGNOSIS — I5030 Unspecified diastolic (congestive) heart failure: Secondary | ICD-10-CM | POA: Diagnosis not present

## 2021-03-04 DIAGNOSIS — I270 Primary pulmonary hypertension: Secondary | ICD-10-CM | POA: Diagnosis not present

## 2021-03-04 DIAGNOSIS — G4730 Sleep apnea, unspecified: Secondary | ICD-10-CM | POA: Diagnosis not present

## 2021-03-04 NOTE — Therapy (Signed)
[facility] [address] [place], [place], [address] Phone: [phone]   Fax:  [phone]  Physical Therapy Treatment  Patient Details  Name: [name] MRN: [id number] Date of Birth: 01/27/1960 Referring Provider (PT): Georjean Mode   Encounter Date: 03/04/2021   PT End of Session - 03/04/21 1143     Visit Number 9    Number of Visits 18    Date for PT Re-Evaluation 03/13/21    Authorization Type Healthy blue    Authorization Time Period 15 visits approved 1/23-->02/27/21; re-eval + 12 visits from 2/27-->03/27/21    Authorization - Visit Number 2    Authorization - Number of Visits 13    PT Start Time 0925    PT Stop Time 1043    PT Time Calculation (min) 78 min    Activity Tolerance Patient tolerated treatment well    Behavior During Therapy [facility] for tasks assessed/performed             Past Medical History:  Diagnosis Date   Acute on chronic diastolic (congestive) heart failure ([place]) 06/25/2013   Acute on chronic respiratory failure ([place]) 06/15/2018   Anemia, iron deficiency    secondary to menhorrhagia, on oral iron, also b12 def, getting monthly b12 shots   Cellulitis 05/2019   RIGHT LOWER EXTREMITY   CHF (congestive heart failure) (HCC)    Chronic cough    secondary to alleriges and post nasal drip   Cognitive impairment    COPD (chronic obstructive pulmonary disease) ([place])    Cor pulmonale (HCC)    PA Peak pressure 80mHg   Depression    Diabetes mellitus    well controlled on metformin   Diastolic heart failure (HCC)    GERD (gastroesophageal reflux disease)    H/O mental retardation    Herpes    Hyperlipidemia    Hypertension    OSA (obstructive sleep apnea)    CPAP   Pulmonary hypertension ([place]    Renal disorder    Restrictive lung disease    PFTs 06/2012 (FVC 54% predicted and FEV1 68% predicted w minimal bronchodilator response).   Shortness of breath    Venous stasis ulcer ([place]    chornic,  ?followed up at wound care center, multiple courses of antibiotics in past for cellulitis, on lasix    Past Surgical History:  Procedure Laterality Date   CARDIAC CATHETERIZATION N/A 01/13/2016   Procedure: Right Heart Cath;  Surgeon: DJolaine Artist MD;  [facility];  Service: Cardiovascular;  Laterality: N/A;   CHOLECYSTECTOMY     COLONOSCOPY WITH PROPOFOL N/A 12/20/2019   Procedure: COLONOSCOPY WITH PROPOFOL;  Surgeon: JMilus Banister MD;  [facility];  Service: Endoscopy;  Laterality: N/A;   IR FLUORO GUIDE CV LINE LEFT  11/16/2019   IR FLUORO GUIDE CV LINE LEFT  06/17/2020   IR FLUORO GUIDE CV LINE LEFT  07/04/2020   IR FLUORO GUIDE CV LINE RIGHT  05/09/2019   IR FLUORO GUIDE CV LINE RIGHT  08/12/2019   IR REMOVAL TUN CV CATH W/O FL  05/05/2019   IR [place] VASC ACCESS LEFT  11/16/2019   IR [place] VASC ACCESS LEFT  06/17/2020   IR [place] VASC ACCESS LEFT  07/04/2020   IR [place] VASC ACCESS RIGHT  05/09/2019   IR [place]IDE VASC ACCESS RIGHT  08/12/2019   IR [place] VASC ACCESS RIGHT  11/16/2019   IR VENO/JUGULAR RIGHT  11/16/2019   LEFT AND RIGHT HEART CATHETERIZATION WITH CORONARY ANGIOGRAM N/A 02/28/2014   Procedure: LEFT AND RIGHT HEART CATHETERIZATION WITH CORONARY ANGIOGRAM;  Surgeon: Jolaine Artist, MD;  Location: [facility] CATH LAB;  Service: Cardiovascular;  Laterality: N/A;   RIGHT HEART CATH N/A 06/27/2018   Procedure: RIGHT HEART CATH;  Surgeon: Jolaine Artist, MD;  Location: [facility] CV LAB;  Service: Cardiovascular;  Laterality: N/A;   RIGHT/LEFT HEART CATH AND CORONARY ANGIOGRAPHY N/A 05/14/2019   Procedure: RIGHT/LEFT HEART CATH AND CORONARY ANGIOGRAPHY;  Surgeon: Jolaine Artist, MD;  Location: [facility] CV LAB;  Service: Cardiovascular;  Laterality: N/A;    There were no vitals filed for this visit.   Subjective Assessment - 03/04/21 1129     Subjective Pt stated she had a rough day yesterday, couldn't get her legs to stay still.   Reports she hasn't order bandages yet, plans to do today.    Pertinent History Htn, CHF, COPD, OA, RT LE cellulitis, obesity.    Patient Stated Goals legs to be smaller    Currently in Pain? No/denies                   LYMPHEDEMA/ONCOLOGY QUESTIONNAIRE - 03/04/21 0001       Right Lower Extremity Lymphedema   30 cm Proximal to Suprapatella --   on 01/07/21 eval was: 70.3   20 cm Proximal to Suprapatella 64.5 cm   on 01/07/21 eval was: 68.5   10 cm Proximal to Suprapatella 64.8 cm   on 01/07/21 eval was:   At Midpatella/Popliteal Crease 53 cm   on 01/07/21 eval was: 54.5   30 cm Proximal to Floor at Lateral Plantar Foot 52.7 cm   on 01/07/21 eval was: 60.2   20 cm Proximal to Floor at Lateral Plantar Foot 42.7 1   on 01/07/21 eval was: 46.8   10 cm Proximal to Floor at Lateral Malleoli 27.8 cm   on 01/07/21 eval was: 33.5   Circumference of ankle/heel 35.5 cm.   on 01/07/21 eval was: 37   5 cm Proximal to 1st MTP Joint 25.8 cm   on 01/07/21 eval was: 27.5   Across MTP Joint 25 cm   on 01/07/21 eval was: 25.5     Left Lower Extremity Lymphedema   30 cm Proximal to Suprapatella --   on 01/07/21 eval was: 68.2   20 cm Proximal to Suprapatella 63 cm   on 01/07/21 eval was: 68.2   10 cm Proximal to Suprapatella 67 cm   on 01/07/21 eval was: 67   At Midpatella/Popliteal Crease 48.2 cm   on 01/07/21 eval was:51   30 cm Proximal to Floor at Lateral Plantar Foot 50.5 cm   on 01/07/21 eval was: 62.7   20 cm Proximal to Floor at Lateral Plantar Foot 42.6 cm   on 01/07/21 eval was: 44.2   10 cm Proximal to Floor at Lateral Malleoli 27.4 cm   on 01/07/21 eval was:34   Circumference of ankle/heel 33.7 cm.   on 01/07/21 eval was:36.6   5 cm Proximal to 1st MTP Joint 25.3 cm   on 01/07/21 eval was:27.8   Across MTP Joint 24.2 cm   on 01/07/21 eval was: 26.6                       [facility] Adult PT Treatment/Exercise - 03/04/21 0001       Manual Therapy   Manual Therapy Manual Lymphatic  Drainage (MLD);Compression  Bandaging;Other (comment)    Manual therapy comments completed seperate from all other aspects of treatment    Manual Lymphatic Drainage (MLD) supracalvicular, deep and superfical abdominal inguinal/axillary anastomosis f/b B LE; posterior completed in sidelying position    Compression Bandaging to distal bil LE's using profore system 1/2in foam    Other Manual Therapy Measurements                       PT Short Term Goals - 02/27/21 1047       PT SHORT TERM GOAL #1   Title Pt to be I in HEP to assist in increasing the lymphatic circulation    Time 2    Period Weeks    Status On-going    Target Date 01/21/21      PT SHORT TERM GOAL #2   Title PT to be completing good skin care on a daily basis    Time 2    Period Weeks    Status Partially Met      PT SHORT TERM GOAL #3   Title PT to have lost 2-3 cm of fluid from B LE to reduce risk of cellulitis    Time 6    Period Weeks    Status Partially Met   Pt will not be starting treatment for 3 weeks   Target Date 02/13/21               PT Long Term Goals - 02/27/21 1047       PT LONG TERM GOAL #1   Title Pt to have lost 5-6  cm from B LE to reduce risk of cellulitis and to be able to fit into shoes and pants    Time 10    Period Weeks    Status On-going    Target Date 03/06/21      PT LONG TERM GOAL #2   Title PT to have and be using a compression pump    Time 10    Period Weeks    Status Not Met      PT LONG TERM GOAL #3   Title PT to have and to be able to don juxtafit    Time 10    Period Weeks    Status Not Met                   Plan - 03/04/21 1145     Clinical Impression Statement Measurements taken with decrease in volume BLE.  Manual lymphedema decognestive technqiues complete anterior and posterior (in sidelying).  Noted induration present posterior medial thigh.  Pt educated on need for short stretch bandages for thigh high compression, stated she plans to order today.  Pt  educated on self manual for thigh work, will need to review for proper mechanics in future visits.  Continued with profore and 1/2in foam.    Personal Factors and Comorbidities Comorbidity 3+;Fitness;Time since onset of injury/illness/exacerbation    Comorbidities HTN, obesity, CHF, COPD, OA, Hx of cellulitis    Examination-Activity Limitations Dressing;Hygiene/Grooming;Locomotion Level;Squat;Stairs    Examination-Participation Restrictions Cleaning;Community Activity;Meal Prep    Stability/Clinical Decision Making Evolving/Moderate complexity    Clinical Decision Making Moderate    Rehab Potential Good    PT Frequency 3x / week    PT Duration 6 weeks    PT Treatment/Interventions Therapeutic exercise;Manual lymph drainage;Compression bandaging    PT Next Visit Plan .  Continue total decongestive techniques.  continue with  profore system using foam until short stretch avalable.  Measure on Wed.    PT Home Exercise Plan sitting: ankle pumps, LAQ, hip ab/adduction, marching,diaphragmic breathing and lymph squeeze    Consulted and Agree with Plan of Care Patient             Patient will benefit from skilled therapeutic intervention in order to improve the following deficits and impairments:  Obesity, Decreased scar mobility, Increased edema, Decreased skin integrity  Visit Diagnosis: Lymphedema, not elsewhere classified     Problem List Patient Active Problem List   Diagnosis Date Noted   Knee osteoarthritis 12/12/2020   Vision changes 09/02/2020   Depression 09/02/2020   Swelling of lower extremity 08/13/2020   Left paraspinal back pain 05/05/2020   Hearing difficulty of both ears 05/05/2020   Internal hemorrhoid 01/11/2020   External hemorrhoid 01/11/2020   Fibroid, uterine 11/07/2019   Vagina bleeding 45/73/3448   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   Bleeding from the genitourinary system 09/13/2019   Gross hematuria 09/13/2019   Chronic venous stasis 05/07/2019    Urinary incontinence 12/12/2018   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 [place], [place], [address] Phone: [phone]   Fax:  [phone]  Name: [name] MRN: [id number] Date of Birth: [birth year]

## 2021-03-05 ENCOUNTER — Ambulatory Visit (HOSPITAL_COMMUNITY): Payer: Medicaid Other | Admitting: Physical Therapy

## 2021-03-05 DIAGNOSIS — I890 Lymphedema, not elsewhere classified: Secondary | ICD-10-CM | POA: Diagnosis not present

## 2021-03-05 NOTE — Therapy (Signed)
[facility] [address] [place], [place], [address] Phone: [phone]   Fax:  [phone]  Physical Therapy Treatment  Patient Details  Name: [name] MRN: [id number] Date of Birth: 02-07-1960 Referring Provider (PT): Georjean Mode   Encounter Date: 03/05/2021   PT End of Session - 03/05/21 1638     Visit Number 10    Number of Visits 18    Date for PT Re-Evaluation 03/13/21    Authorization Type [employer]    Authorization - Visit Number 3    Authorization - Number of Visits 12    PT Start Time 3559   pt late   PT Stop Time 7416    PT Time Calculation (min) 55 min             Past Medical History:  Diagnosis Date   Acute on chronic diastolic (congestive) heart failure ([place]) 06/25/2013   Acute on chronic respiratory failure ([place]) 06/15/2018   Anemia, iron deficiency    secondary to menhorrhagia, on oral iron, also b12 def, getting monthly b12 shots   Cellulitis 05/2019   RIGHT LOWER EXTREMITY   CHF (congestive heart failure) (HCC)    Chronic cough    secondary to alleriges and post nasal drip   Cognitive impairment    COPD (chronic obstructive pulmonary disease) ([place])    Cor pulmonale (HCC)    PA Peak pressure 62mHg   Depression    Diabetes mellitus    well controlled on metformin   Diastolic heart failure (HCC)    GERD (gastroesophageal reflux disease)    H/O mental retardation    Herpes    Hyperlipidemia    Hypertension    OSA (obstructive sleep apnea)    CPAP   Pulmonary hypertension ([facility]    Renal disorder    Restrictive lung disease    PFTs 06/2012 (FVC 54% predicted and FEV1 68% predicted w minimal bronchodilator response).   Shortness of breath    Venous stasis ulcer ([facility]    chornic, ?followed up at wound care center, multiple courses of antibiotics in past for cellulitis, on lasix    Past Surgical History:  Procedure Laterality Date   CARDIAC CATHETERIZATION N/A 01/13/2016   Procedure:  Right Heart Cath;  Surgeon: DJolaine Artist MD;  [facility];  Service: Cardiovascular;  Laterality: N/A;   CHOLECYSTECTOMY     COLONOSCOPY WITH PROPOFOL N/A 12/20/2019   Procedure: COLONOSCOPY WITH PROPOFOL;  Surgeon: JMilus Banister MD;  [facility]: [facility];  Service: Endoscopy;  Laterality: N/A;   IR FLUORO GUIDE CV LINE LEFT  11/16/2019   IR FLUORO GUIDE CV LINE LEFT  06/17/2020   IR FLUORO GUIDE CV LINE LEFT  07/04/2020   IR FLUORO GUIDE CV LINE RIGHT  05/09/2019   IR FLUORO GUIDE CV LINE RIGHT  08/12/2019   IR REMOVAL TUN CV CATH W/O FL  05/05/2019   IR [place]ASC ACCESS LEFT  11/16/2019   IR [place] VASC ACCESS LEFT  06/17/2020   IR [place] VASC ACCESS LEFT  07/04/2020   IR [place] VASC ACCESS RIGHT  05/09/2019   IR [place] VASC ACCESS RIGHT  08/12/2019   IR [place] VASC ACCESS RIGHT  11/16/2019   IR VENO/JUGULAR RIGHT  11/16/2019   LEFT AND RIGHT HEART CATHETERIZATION WITH CORONARY ANGIOGRAM N/A 02/28/2014   Procedure: LEFT AND RIGHT HEART CATHETERIZATION WITH CORONARY ANGIOGRAM;  Surgeon: DJolaine Artist MD;  Location: [facility];  Service: Cardiovascular;  Laterality: N/A;   RIGHT HEART CATH N/A 06/27/2018   Procedure: RIGHT HEART CATH;  Surgeon: Jolaine Artist, MD;  Location: [facility];  Service: Cardiovascular;  Laterality: N/A;   RIGHT/LEFT HEART CATH AND CORONARY ANGIOGRAPHY N/A 05/14/2019   Procedure: RIGHT/LEFT HEART CATH AND CORONARY ANGIOGRAPHY;  Surgeon: Jolaine Artist, MD;  Location: [facility];  Service: Cardiovascular;  Laterality: N/A;    There were no vitals filed for this visit.                                 PT Short Term Goals - 03/05/21 1727       PT SHORT TERM GOAL #1   Title Pt to be I in HEP to assist in increasing the lymphatic circulation    Time 2    Period Weeks    Status On-going    Target Date 01/21/21      PT SHORT TERM GOAL #2   Title PT to be completing good skin  care on a daily basis    Time 2    Period Weeks    Status Partially Met      PT SHORT TERM GOAL #3   Title PT to have lost 2-3 cm of fluid from B LE to reduce risk of cellulitis    Time 6    Period Weeks    Status Achieved   Pt will not be starting treatment for 3 weeks   Target Date 02/13/21               PT Long Term Goals - 03/05/21 1727       PT LONG TERM GOAL #1   Title Pt to have lost 5-6  cm from B LE to reduce risk of cellulitis and to be able to fit into shoes and pants    Time 10    Period Weeks    Status Partially Met    Target Date 03/06/21      PT LONG TERM GOAL #2   Title PT to have and be using a compression pump    Baseline 3/3 has pump has not been able to use it due to compression bandages    Time 10    Period Weeks    Status Partially Met      PT LONG TERM GOAL #3   Title PT to have and to be able to don juxtafit    Baseline 3/3 sent order today    Time 10    Period Weeks    Status Not Met                   Plan - 03/05/21 1721     Clinical Impression Statement Pt has not been doing her exercises, therapist urged pt to complete her exercisese.  PT feels that she would have more success with juxtafit.  Pt has her pump already.  Pt has not ordered short stretch at this time and therapist does not believe that she is going to therefore focus will be on decreasing Lower leg edema to maximal level, either measure for juxtafit and give pt site where she can aquire them or send to [facility].  Therapist sent order for juxtafit today.    Personal Factors and Comorbidities Comorbidity 3+;Fitness;Time since onset of injury/illness/exacerbation    Comorbidities HTN, obesity, CHF, COPD,  OA, Hx of cellulitis    Examination-Activity Limitations Dressing;Hygiene/Grooming;Locomotion Level;Squat;Stairs    Examination-Participation Restrictions Cleaning;Community Activity;Meal Prep    Stability/Clinical Decision Making Evolving/Moderate complexity     Rehab Potential Good    PT Frequency 3x / week    PT Duration 6 weeks    PT Treatment/Interventions Therapeutic exercise;Manual lymph drainage;Compression bandaging    PT Next Visit Plan .  Continue total decongestive techniques.  continue with profore system .  Measure on Wed.    PT Home Exercise Plan sitting: ankle pumps, LAQ, hip ab/adduction, marching,diaphragmic breathing and lymph squeeze    Consulted and Agree with Plan of Care Patient             Patient will benefit from skilled therapeutic intervention in order to improve the following deficits and impairments:  Obesity, Decreased scar mobility, Increased edema, Decreased skin integrity  Visit Diagnosis: Lymphedema, not elsewhere classified     Problem List Patient Active Problem List   Diagnosis Date Noted   Knee osteoarthritis 12/12/2020   Vision changes 09/02/2020   Depression 09/02/2020   Swelling of lower extremity 08/13/2020   Left paraspinal back pain 05/05/2020   Hearing difficulty of both ears 05/05/2020   Internal hemorrhoid 01/11/2020   External hemorrhoid 01/11/2020   Fibroid, uterine 11/07/2019   Vagina bleeding 09/09/254   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   Bleeding from the genitourinary system 09/13/2019   Gross hematuria 09/13/2019   Chronic venous stasis 05/07/2019   Urinary incontinence 12/12/2018   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 [place], [place],  [address] Phone: [phone]   Fax:  [phone]  Name: [name] MRN: [id number] Date of Birth: April 23, 1960

## 2021-03-05 NOTE — Therapy (Signed)
[facility] [address] [place], [place], [address] Phone: [phone]   Fax:  [phone]  Physical Therapy Treatment  Patient Details  Name: [name] MRN: [id number] Date of Birth: 1960/07/26 Referring Provider (PT): Georjean Mode   Encounter Date: 03/05/2021   PT End of Session - 03/05/21 1638     Visit Number 10    Number of Visits 18    Date for PT Re-Evaluation 03/13/21    Authorization Type [employer] blue    Authorization - Visit Number 3    Authorization - Number of Visits 12    PT Start Time 8250   pt late   PT Stop Time 0370    PT Time Calculation (min) 55 min             Past Medical History:  Diagnosis Date   Acute on chronic diastolic (congestive) heart failure ([place]) 06/25/2013   Acute on chronic respiratory failure ([place]) 06/15/2018   Anemia, iron deficiency    secondary to menhorrhagia, on oral iron, also b12 def, getting monthly b12 shots   Cellulitis 05/2019   RIGHT LOWER EXTREMITY   CHF (congestive heart failure) (HCC)    Chronic cough    secondary to alleriges and post nasal drip   Cognitive impairment    COPD (chronic obstructive pulmonary disease) ([place])    Cor pulmonale (HCC)    PA Peak pressure 59mHg   Depression    Diabetes mellitus    well controlled on metformin   Diastolic heart failure (HCC)    GERD (gastroesophageal reflux disease)    H/O mental retardation    Herpes    Hyperlipidemia    Hypertension    OSA (obstructive sleep apnea)    CPAP   Pulmonary hypertension ([facility]    Renal disorder    Restrictive lung disease    PFTs 06/2012 (FVC 54% predicted and FEV1 68% predicted w minimal bronchodilator response).   Shortness of breath    Venous stasis ulcer ([place]    chornic, ?followed up at wound care center, multiple courses of antibiotics in past for cellulitis, on lasix    Past Surgical History:  Procedure Laterality Date   CARDIAC CATHETERIZATION N/A 01/13/2016   Procedure:  Right Heart Cath;  Surgeon: DJolaine Artist MD;  [facility];  Service: Cardiovascular;  Laterality: N/A;   CHOLECYSTECTOMY     COLONOSCOPY WITH PROPOFOL N/A 12/20/2019   Procedure: COLONOSCOPY WITH PROPOFOL;  Surgeon: JMilus Banister MD;  [facility];  Service: Endoscopy;  Laterality: N/A;   IR FLUORO GUIDE CV LINE LEFT  11/16/2019   IR FLUORO GUIDE CV LINE LEFT  06/17/2020   IR FLUORO GUIDE CV LINE LEFT  07/04/2020   IR FLUORO GUIDE CV LINE RIGHT  05/09/2019   IR FLUORO GUIDE CV LINE RIGHT  08/12/2019   IR REMOVAL TUN CV CATH W/O FL  05/05/2019   IR [place] VASC ACCESS LEFT  11/16/2019   IR [place] VASC ACCESS LEFT  06/17/2020   IR [place] VASC ACCESS LEFT  07/04/2020   IR [place] VASC ACCESS RIGHT  05/09/2019   IR [place] VASC ACCESS RIGHT  08/12/2019   IR [place] VASC ACCESS RIGHT  11/16/2019   IR VENO/JUGULAR RIGHT  11/16/2019   LEFT AND RIGHT HEART CATHETERIZATION WITH CORONARY ANGIOGRAM N/A 02/28/2014   Procedure: LEFT AND RIGHT HEART CATHETERIZATION WITH CORONARY ANGIOGRAM;  Surgeon: DJolaine Artist MD;  Location: [facility];  Service: Cardiovascular;  Laterality: N/A;   RIGHT HEART CATH N/A 06/27/2018   Procedure: RIGHT HEART CATH;  Surgeon: Jolaine Artist, MD;  Location: [facility];  Service: Cardiovascular;  Laterality: N/A;   RIGHT/LEFT HEART CATH AND CORONARY ANGIOGRAPHY N/A 05/14/2019   Procedure: RIGHT/LEFT HEART CATH AND CORONARY ANGIOGRAPHY;  Surgeon: Jolaine Artist, MD;  Location: [facility];  Service: Cardiovascular;  Laterality: N/A;    There were no vitals filed for this visit.   Subjective Assessment - 03/05/21 1730     Subjective Pt states that she has not pain in her legs.  She thinks she would do better with the juxtafit vx. compression garment    Pertinent History Htn, CHF, COPD, OA, RT LE cellulitis, obesity.    Limitations Lifting;Standing;Walking;House hold activities    How long can you sit comfortably?  no problem    How long can you stand comfortably? less than five minutes    How long can you walk comfortably? Walks with a rollator ambulates for less than 65 feet at a time.    Patient Stated Goals legs to be smaller    Currently in Pain? No/denies                               [facility] Adult PT Treatment/Exercise - 03/05/21 0001       Manual Therapy   Manual Therapy Manual Lymphatic Drainage (MLD);Compression Bandaging;Other (comment)    Manual therapy comments completed seperate from all other aspects of treatment    Manual Lymphatic Drainage (MLD) supracalvicular, deep and superfical abdominal inguinal/axillary anastomosis f/b B LE; posterior not  completed due to pt being late.    Compression Bandaging to distal bil LE's using profore system 1/2in foam                       PT Short Term Goals - 03/05/21 1727       PT SHORT TERM GOAL #1   Title Pt to be I in HEP to assist in increasing the lymphatic circulation    Time 2    Period Weeks    Status On-going    Target Date 01/21/21      PT SHORT TERM GOAL #2   Title PT to be completing good skin care on a daily basis    Time 2    Period Weeks    Status Partially Met      PT SHORT TERM GOAL #3   Title PT to have lost 2-3 cm of fluid from B LE to reduce risk of cellulitis    Time 6    Period Weeks    Status Achieved   Pt will not be starting treatment for 3 weeks   Target Date 02/13/21               PT Long Term Goals - 03/05/21 1727       PT LONG TERM GOAL #1   Title Pt to have lost 5-6  cm from B LE to reduce risk of cellulitis and to be able to fit into shoes and pants    Time 10    Period Weeks    Status Partially Met    Target Date 03/06/21      PT LONG TERM GOAL #2   Title PT to have and be using a compression pump  Baseline 3/3 has pump has not been able to use it due to compression bandages    Time 10    Period Weeks    Status Partially Met      PT LONG TERM  GOAL #3   Title PT to have and to be able to don juxtafit    Baseline 3/3 sent order today    Time 10    Period Weeks    Status Not Met                   Plan - 03/05/21 1721     Clinical Impression Statement Pt has not been doing her exercises, therapist urged pt to complete her exercisese.  PT feels that she would have more success with juxtafit.  Pt has her pump already.  Pt has not ordered short stretch at this time and therapist does not believe that she is going to therefore focus will be on decreasing Lower leg edema to maximal level, either measure for juxtafit and give pt site where she can aquire them or send to [facility].  Therapist sent order for juxtafit today.    Personal Factors and Comorbidities Comorbidity 3+;Fitness;Time since onset of injury/illness/exacerbation    Comorbidities HTN, obesity, CHF, COPD, OA, Hx of cellulitis    Examination-Activity Limitations Dressing;Hygiene/Grooming;Locomotion Level;Squat;Stairs    Examination-Participation Restrictions Cleaning;Community Activity;Meal Prep    Stability/Clinical Decision Making Evolving/Moderate complexity    Rehab Potential Good    PT Frequency 3x / week    PT Duration 6 weeks    PT Treatment/Interventions Therapeutic exercise;Manual lymph drainage;Compression bandaging    PT Next Visit Plan .  Continue total decongestive techniques.  continue with profore system .  Measure on Wed.    PT Home Exercise Plan sitting: ankle pumps, LAQ, hip ab/adduction, marching,diaphragmic breathing and lymph squeeze    Consulted and Agree with Plan of Care Patient             Patient will benefit from skilled therapeutic intervention in order to improve the following deficits and impairments:  Obesity, Decreased scar mobility, Increased edema, Decreased skin integrity  Visit Diagnosis: Lymphedema, not elsewhere classified     Problem List Patient Active Problem List   Diagnosis Date Noted   Knee  osteoarthritis 12/12/2020   Vision changes 09/02/2020   Depression 09/02/2020   Swelling of lower extremity 08/13/2020   Left paraspinal back pain 05/05/2020   Hearing difficulty of both ears 05/05/2020   Internal hemorrhoid 01/11/2020   External hemorrhoid 01/11/2020   Fibroid, uterine 11/07/2019   Vagina bleeding 11/55/2080   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   Bleeding from the genitourinary system 09/13/2019   Gross hematuria 09/13/2019   Chronic venous stasis 05/07/2019   Urinary incontinence 12/12/2018   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[place])    Right ovarian cyst 05/19/2017   Peripheral neuropathy ([place]) 09/27/2014   Healthcare maintenance 09/27/2014   Atrial flutter ([place]) 08/17/2012   Right heart failure, NYHA class 3 ([place]) 03/15/2012   Allergic rhinitis 11/16/2011   Diabetes ([place]) 10/15/2010   Hyperlipidemia 10/15/2010   Obesity 10/14/2010   Obstructive sleep apnea 02/21/2009   GERD 11/29/2005  Rayetta Humphrey, PT CLT [phone]  03/05/2021, 5:32 PM  [facility] [address] [place], [place], [address] Phone: [phone]   Fax:  [phone]  Name: PATRESE NEAL MRN: [id number] Date of Birth: 01-24-1960

## 2021-03-09 DIAGNOSIS — I890 Lymphedema, not elsewhere classified: Secondary | ICD-10-CM | POA: Diagnosis not present

## 2021-03-10 ENCOUNTER — Ambulatory Visit (HOSPITAL_COMMUNITY): Payer: Medicaid Other | Admitting: Physical Therapy

## 2021-03-10 ENCOUNTER — Other Ambulatory Visit: Payer: Self-pay

## 2021-03-10 DIAGNOSIS — I890 Lymphedema, not elsewhere classified: Secondary | ICD-10-CM | POA: Diagnosis not present

## 2021-03-10 NOTE — Therapy (Signed)
[facility] [address] [place], [place], [address] Phone: [phone]   Fax:  [phone]  Physical Therapy Treatment  Patient Details  Name: [name] MRN: [id number] Date of Birth: [birth year] Referring Provider (PT): Georjean Mode   Encounter Date: 03/10/2021   PT End of Session - 03/10/21 1210     Visit Number 11    Number of Visits 18    Date for PT Re-Evaluation 03/13/21    Authorization Type [employer] blue    Authorization - Visit Number 4    Authorization - Number of Visits 12    PT Start Time 2947    PT Stop Time 1202    PT Time Calculation (min) 69 min             Past Medical History:  Diagnosis Date   Acute on chronic diastolic (congestive) heart failure ([place]) 06/25/2013   Acute on chronic respiratory failure ([place]) 06/15/2018   Anemia, iron deficiency    secondary to menhorrhagia, on oral iron, also b12 def, getting monthly b12 shots   Cellulitis 05/2019   RIGHT LOWER EXTREMITY   CHF (congestive heart failure) (HCC)    Chronic cough    secondary to alleriges and post nasal drip   Cognitive impairment    COPD (chronic obstructive pulmonary disease) ([place])    Cor pulmonale (HCC)    PA Peak pressure 12mHg   Depression    Diabetes mellitus    well controlled on metformin   Diastolic heart failure (HCC)    GERD (gastroesophageal reflux disease)    H/O mental retardation    Herpes    Hyperlipidemia    Hypertension    OSA (obstructive sleep apnea)    CPAP   Pulmonary hypertension ([place]    Renal disorder    Restrictive lung disease    PFTs 06/2012 (FVC 54% predicted and FEV1 68% predicted w minimal bronchodilator response).   Shortness of breath    Ven[place] st[place]    chornic, ?followed up at wound care center, multiple courses of antibiotics in past for cellulitis, on lasix    Past Surgical History:  Procedure Laterality Date   CARDIAC CATHETERIZATION N/A 01/13/2016   Procedure: Right  Heart Cath;  Surgeon: DJolaine Artist MD;  [facility];  Service: Cardiovascular;  Laterality: N/A;   CHOLECYSTECTOMY     COLONOSCOPY WITH PROPOFOL N/A 12/20/2019   Procedure: COLONOSCOPY WITH PROPOFOL;  Surgeon: JMilus Banister MD;  [facility]: [facility];  Service: Endoscopy;  Laterality: N/A;   IR FLUORO GUIDE CV LINE LEFT  11/16/2019   IR FLUORO GUIDE CV LINE LEFT  06/17/2020   IR FLUORO GUIDE CV LINE LEFT  07/04/2020   IR FLUORO GUIDE CV LINE RIGHT  05/09/2019   IR FLUORO GUIDE CV LINE RIGHT  08/12/2019   IR REMOVAL TUN CV CATH W/O FL  05/05/2019   IR [place] VASC ACCESS LEFT  11/16/2019   IR [place] VASC ACCESS LEFT  06/17/2020   IR [place] VASC ACCESS LEFT  07/04/2020   IR [place] VASC ACCESS RIGHT  05/09/2019   IR [place] VASC ACCESS RIGHT  08/12/2019   IR [place] VASC ACCESS RIGHT  11/16/2019   IR VENO/JUGULAR RIGHT  11/16/2019   LEFT AND RIGHT HEART CATHETERIZATION WITH CORONARY ANGIOGRAM N/A 02/28/2014   Procedure: LEFT AND RIGHT HEART CATHETERIZATION WITH CORONARY ANGIOGRAM;  Surgeon: DJolaine Artist MD;  [id number];  Service: Cardiovascular;  Laterality: N/A;   RIGHT HEART CATH N/A 06/27/2018   Procedure: RIGHT HEART CATH;  Surgeon: Jolaine Artist, MD;  Location: [facility] LAB;  Service: Cardiovascular;  Laterality: N/A;   RIGHT/LEFT HEART CATH AND CORONARY ANGIOGRAPHY N/A 05/14/2019   Procedure: RIGHT/LEFT HEART CATH AND CORONARY ANGIOGRAPHY;  Surgeon: Jolaine Artist, MD;  Location: [facility] CV LAB;  Service: Cardiovascular;  Laterality: N/A;    There were no vitals filed for this visit.   Subjective Assessment - 03/10/21 1207     Subjective pt states she is doing well today.  States her son has ordered her compression wraps.  Plans on calling today to get a foot MD appt.    Currently in Pain? No/denies                               [facility] Adult PT Treatment/Exercise - 03/10/21 0001       Manual Therapy    Manual Therapy Manual Lymphatic Drainage (MLD);Compression Bandaging;Other (comment)    Manual therapy comments completed seperate from all other aspects of treatment    Manual Lymphatic Drainage (MLD) supracalvicular, deep and superfical abdominal inguinal/axillary anastomosis f/b B LE; posterior not  completed due to pt being late.    Compression Bandaging to distal bil LE's using profore system 1/2in foam                       PT Short Term Goals - 03/05/21 1727       PT SHORT TERM GOAL #1   Title Pt to be I in HEP to assist in increasing the lymphatic circulation    Time 2    Period Weeks    Status On-going    Target Date 01/21/21      PT SHORT TERM GOAL #2   Title PT to be completing good skin care on a daily basis    Time 2    Period Weeks    Status Partially Met      PT SHORT TERM GOAL #3   Title PT to have lost 2-3 cm of fluid from B LE to reduce risk of cellulitis    Time 6    Period Weeks    Status Achieved   Pt will not be starting treatment for 3 weeks   Target Date 02/13/21               PT Long Term Goals - 03/05/21 1727       PT LONG TERM GOAL #1   Title Pt to have lost 5-6  cm from B LE to reduce risk of cellulitis and to be able to fit into shoes and pants    Time 10    Period Weeks    Status Partially Met    Target Date 03/06/21      PT LONG TERM GOAL #2   Title PT to have and be using a compression pump    Baseline 3/3 has pump has not been able to use it due to compression bandages    Time 10    Period Weeks    Status Partially Met      PT LONG TERM GOAL #3   Title PT to have and to be able to don juxtafit    Baseline 3/3 sent order today    Time 10    Period Weeks  Status Not Met                   Plan - 03/10/21 1208     Clinical Impression Statement Manual lymph drainage and bandaging completed using profore bilaterally.  LE's cleansed well and moisturized prior to rebandaging and following manual.  Pt  with little induration present in lower legs, medial inner thigh induration present, however.  Pt reminded to make foot dr appt and states she will do so this evening. Pt will continue to beneift from lymphatic therapy.  Only has 2 appts this week and next week due to scheduling.    Personal Factors and Comorbidities Comorbidity 3+;Fitness;Time since onset of injury/illness/exacerbation    Comorbidities HTN, obesity, CHF, COPD, OA, Hx of cellulitis    Examination-Activity Limitations Dressing;Hygiene/Grooming;Locomotion Level;Squat;Stairs    Examination-Participation Restrictions Cleaning;Community Activity;Meal Prep    Stability/Clinical Decision Making Evolving/Moderate complexity    Rehab Potential Good    PT Frequency 3x / week    PT Duration 6 weeks    PT Treatment/Interventions Therapeutic exercise;Manual lymph drainage;Compression bandaging    PT Next Visit Plan .  Continue total decongestive techniques.  continue with profore system .  Measure on thursday.    PT Home Exercise Plan sitting: ankle pumps, LAQ, hip ab/adduction, marching,diaphragmic breathing and lymph squeeze    Consulted and Agree with Plan of Care Patient             Patient will benefit from skilled therapeutic intervention in order to improve the following deficits and impairments:  Obesity, Decreased scar mobility, Increased edema, Decreased skin integrity  Visit Diagnosis: Lymphedema, not elsewhere classified     Problem List Patient Active Problem List   Diagnosis Date Noted   Knee osteoarthritis 12/12/2020   Vision changes 09/02/2020   Depression 09/02/2020   Swelling of lower extremity 08/13/2020   Left paraspinal back pain 05/05/2020   Hearing difficulty of both ears 05/05/2020   Internal hemorrhoid 01/11/2020   External hemorrhoid 01/11/2020   Fibroid, uterine 11/07/2019   Vagina bleeding 35/70/1779   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   Bleeding from the genitourinary system 09/13/2019    Gross hematuria 09/13/2019   Chronic venous stasis 05/07/2019   Urinary incontinence 12/12/2018   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

## 2021-03-12 ENCOUNTER — Encounter (HOSPITAL_COMMUNITY): Payer: Self-pay

## 2021-03-12 ENCOUNTER — Other Ambulatory Visit: Payer: Self-pay

## 2021-03-12 ENCOUNTER — Ambulatory Visit (HOSPITAL_COMMUNITY): Payer: Medicaid Other

## 2021-03-12 DIAGNOSIS — I890 Lymphedema, not elsewhere classified: Secondary | ICD-10-CM | POA: Diagnosis not present

## 2021-03-12 NOTE — Therapy (Addendum)
[facility] [address] [place], [place], [address] Phone: [phone]   Fax:  [phone]  Physical Therapy Treatment  Patient Details  Name: [name] MRN: [id number] Date of Birth: 1960-02-20 Referring Provider (PT): Georjean Mode   Encounter Date: 03/12/2021   PT End of Session - 03/12/21 1037     Visit Number 12    Number of Visits 18    Date for PT Re-Evaluation 03/13/21    Authorization Type Healthy blue    Authorization Time Period 15 visits approved 1/23-->02/27/21; re-eval + 12 visits from 2/27-->03/27/21    Authorization - Visit Number 5    Authorization - Number of Visits 12    PT Start Time 0901    PT Stop Time 7867    PT Time Calculation (min) 82 min    Activity Tolerance Patient tolerated treatment well    Behavior During Therapy [facility] for tasks assessed/performed             Past Medical History:  Diagnosis Date   Acute on chronic diastolic (congestive) heart failure ([place]) 06/25/2013   Acute on chronic respiratory failure ([place]) 06/15/2018   Anemia, iron deficiency    secondary to menhorrhagia, on oral iron, also b12 def, getting monthly b12 shots   Cellulitis 05/2019   RIGHT LOWER EXTREMITY   CHF (congestive heart failure) (HCC)    Chronic cough    secondary to alleriges and post nasal drip   Cognitive impairment    COPD (chronic obstructive pulmonary disease) ([place])    Cor pulmonale (HCC)    PA Peak pressure 73mHg   Depression    Diabetes mellitus    well controlled on metformin   Diastolic heart failure (HCC)    GERD (gastroesophageal reflux disease)    H/O mental retardation    Herpes    Hyperlipidemia    Hypertension    OSA (obstructive sleep apnea)    CPAP   Pulmonary hypertension ([place]    Renal disorder    Restrictive lung disease    PFTs 06/2012 (FVC 54% predicted and FEV1 68% predicted w minimal bronchodilator response).   Shortness of breath    Venous stasis ulcer ([place]    chornic,  ?followed up at wound care center, multiple courses of antibiotics in past for cellulitis, on lasix    Past Surgical History:  Procedure Laterality Date   CARDIAC CATHETERIZATION N/A 01/13/2016   Procedure: Right Heart Cath;  Surgeon: DJolaine Artist MD;  [facility];  Service: Cardiovascular;  Laterality: N/A;   CHOLECYSTECTOMY     COLONOSCOPY WITH PROPOFOL N/A 12/20/2019   Procedure: COLONOSCOPY WITH PROPOFOL;  Surgeon: JMilus Banister MD;  [facility];  Service: Endoscopy;  Laterality: N/A;   IR FLUORO GUIDE CV LINE LEFT  11/16/2019   IR FLUORO GUIDE CV LINE LEFT  06/17/2020   IR FLUORO GUIDE CV LINE LEFT  07/04/2020   IR FLUORO GUIDE CV LINE RIGHT  05/09/2019   IR FLUORO GUIDE CV LINE RIGHT  08/12/2019   IR REMOVAL TUN CV CATH W/O FL  05/05/2019   IR [place]IDE VASC ACCESS LEFT  11/16/2019   IR [place] VASC ACCESS LEFT  06/17/2020   IR [place] VASC ACCESS LEFT  07/04/2020   IR [place] VASC ACCESS RIGHT  05/09/2019   IR [place] VASC ACCESS RIGHT  08/12/2019   IR [place] VASC ACCESS RIGHT  11/16/2019   IR VENO/JUGULAR RIGHT  11/16/2019   LEFT AND RIGHT HEART CATHETERIZATION WITH CORONARY ANGIOGRAM N/A 02/28/2014   Procedure: LEFT AND RIGHT HEART CATHETERIZATION WITH CORONARY ANGIOGRAM;  Surgeon: Jolaine Artist, MD;  Location: [facility] LAB;  Service: Cardiovascular;  Laterality: N/A;   RIGHT HEART CATH N/A 06/27/2018   Procedure: RIGHT HEART CATH;  Surgeon: Jolaine Artist, MD;  Location: [facility] LAB;  Service: Cardiovascular;  Laterality: N/A;   RIGHT/LEFT HEART CATH AND CORONARY ANGIOGRAPHY N/A 05/14/2019   Procedure: RIGHT/LEFT HEART CATH AND CORONARY ANGIOGRAPHY;  Surgeon: Jolaine Artist, MD;  Location: [facility];  Service: Cardiovascular;  Laterality: N/A;    There were no vitals filed for this visit.   Subjective Assessment - 03/12/21 1031     Subjective Pt stated she feels increased pressure on Rt LE and c/o itch, pain scale 4/10.   Admits to not completing HEP as regular as she should.    Pertinent History Htn, CHF, COPD, OA, RT LE cellulitis, obesity.    Patient Stated Goals legs to be smaller    Currently in Pain? Yes    Pain Score 4     Pain Location Leg    Pain Orientation Right    Pain Descriptors / Indicators Pressure;Sore                   LYMPHEDEMA/ONCOLOGY QUESTIONNAIRE - 03/12/21 0001       Right Lower Extremity Lymphedema   30 cm Proximal to Suprapatella --   on 01/07/21 was: 70.3   20 cm Proximal to Suprapatella 66 cm   on 01/07/21 was: 68.5   10 cm Proximal to Suprapatella 67 cm   on 01/07/21 was:68.5   At Midpatella/Popliteal Crease 57.6 cm   on 01/07/21 was: 54.5   30 cm Proximal to Floor at Lateral Plantar Foot 51.2 cm   on 01/07/21 was: 60.2   20 cm Proximal to Floor at Lateral Plantar Foot 41.7 1   on 01/07/21 was: 46.8   10 cm Proximal to Floor at Lateral Malleoli 27.9 cm   on 01/07/21 was: 33.5   Circumference of ankle/heel 34.4 cm.   on 01/07/21 was: 37   5 cm Proximal to 1st MTP Joint 25.1 cm   on 01/07/21 was:27.5   Across MTP Joint 24.7 cm   on 01/07/21 was: 25.5     Left Lower Extremity Lymphedema   30 cm Proximal to Suprapatella --   on 01/07/21 was: 68.2   20 cm Proximal to Suprapatella 63 cm   on 01/07/21 was: 68.2   10 cm Proximal to Suprapatella 66.7 cm   on 01/07/21 was: 673   At Midpatella/Popliteal Crease 48.2 cm   on 01/07/21 was: 51   30 cm Proximal to Floor at Lateral Plantar Foot 50.3 cm   on 01/07/21 was: 62.7   20 cm Proximal to Floor at Lateral Plantar Foot 41.3 cm   on 01/07/21 was:44.2   10 cm Proximal to Floor at Lateral Malleoli 27 cm   on 01/07/21 was: 34   Circumference of ankle/heel 33.3 cm.   on 01/07/21 was: 36.6   5 cm Proximal to 1st MTP Joint 24.7 cm   on 01/07/21 was:24.7   Across MTP Joint 24.1 cm   on 01/07/21 was: 26.6            Weight: 253.8#           [facility] Adult PT Treatment/Exercise - 03/12/21 0001  Manual Therapy   Manual Therapy Manual Lymphatic  Drainage (MLD);Compression Bandaging;Other (comment)    Manual therapy comments completed seperate from all other aspects of treatment    Manual Lymphatic Drainage (MLD) supracalvicular, deep and superfical abdominal inguinal/axillary anastomosis f/b B LE; posterior not  completed due to pt being late.    Compression Bandaging to distal bil LE's using profore system 1/2in foam    Other Manual Therapy Measurements                       PT Short Term Goals - 03/12/21 1043       PT SHORT TERM GOAL #1   Title Pt to be I in HEP to assist in increasing the lymphatic circulation    Baseline 3/9: reviewed importance of HEP compliance    Status On-going      PT SHORT TERM GOAL #2   Title PT to be completing good skin care on a daily basis    Baseline 3/9:  Reports understanding of skin care, stated she plans to purchase lotion later today.    Status On-going      PT SHORT TERM GOAL #3   Title PT to have lost 2-3 cm of fluid from B LE to reduce risk of cellulitis    Baseline 03/12/21:  Distal extremities reduced, increased volume Rt thigh.    Status Partially Met               PT Long Term Goals - 03/12/21 1046       PT LONG TERM GOAL #1   Title Pt to have lost 5-6  cm from B LE to reduce risk of cellulitis and to be able to fit into shoes and pants    Status Partially Met      PT LONG TERM GOAL #2   Title PT to have and be using a compression pump    Baseline 3/3 has pump has not been able to use it due to compression bandages      PT LONG TERM GOAL #3   Title PT to have and to be able to don juxtafit    Baseline 3/3 sent order today    Status Not Met                   Plan - 03/12/21 1038     Clinical Impression Statement Measurements complete with reduction in distal extremities though increased volume Rt thigh.  Manual complete with increased focus posterior medial thighs with induration present.  Pt stated son ordered compression garments over  weekend, stated she got 2 of everything.  Pt given handout with specifics quanity per bandage size to begin thigh high compression.  Heavy layer of lotion applied.  Foam cut for improved comfort prior application of profore with 1/2in foam.    Personal Factors and Comorbidities Comorbidity 3+;Fitness;Time since onset of injury/illness/exacerbation    Comorbidities HTN, obesity, CHF, COPD, OA, Hx of cellulitis    Examination-Activity Limitations Dressing;Hygiene/Grooming;Locomotion Level;Squat;Stairs    Examination-Participation Restrictions Cleaning;Community Activity;Meal Prep    Stability/Clinical Decision Making Evolving/Moderate complexity    Clinical Decision Making Moderate    Rehab Potential Good    PT Frequency 3x / week    PT Duration 6 weeks    PT Treatment/Interventions Therapeutic exercise;Manual lymph drainage;Compression bandaging    PT Next Visit Plan .  Continue total decongestive techniques.  continue with profore system .  Measure on thursday.    PT  Home Exercise Plan sitting: ankle pumps, LAQ, hip ab/adduction, marching,diaphragmic breathing and lymph squeeze    Consulted and Agree with Plan of Care Patient             Patient will benefit from skilled therapeutic intervention in order to improve the following deficits and impairments:  Obesity, Decreased scar mobility, Increased edema, Decreased skin integrity  Visit Diagnosis: Lymphedema, not elsewhere classified     Problem List Patient Active Problem List   Diagnosis Date Noted   Knee osteoarthritis 12/12/2020   Vision changes 09/02/2020   Depression 09/02/2020   Swelling of lower extremity 08/13/2020   Left paraspinal back pain 05/05/2020   Hearing difficulty of both ears 05/05/2020   Internal hemorrhoid 01/11/2020   External hemorrhoid 01/11/2020   Fibroid, uterine 11/07/2019   Vagina bleeding 38/37/7939   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   Bleeding from the genitourinary system 09/13/2019    Gross hematuria 09/13/2019   Chronic venous stasis 05/07/2019   Urinary incontinence 12/12/2018   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[place])    Right ovarian cyst 05/19/2017   Peripheral neuropathy ([place]) 09/27/2014   Healthcare maintenance 09/27/2014   Atrial flutter ([place]) 08/17/2012   Right heart failure, NYHA class 3 ([place]) 03/15/2012   Allergic rhinitis 11/16/2011   Diabetes ([place]) 10/15/2010   Hyperlipidemia 10/15/2010   Obesity 10/14/2010   Obstructive sleep apnea 02/21/2009   GERD 11/29/2005   Ihor Austin, LPTA/CLT; CBIS [phone]  Aldona Lento, PTA 03/12/2021, 10:48 AM  [facility] [address] [place], [place], [address] Phone: [phone]   Fax:  [phone]  Name: CERISSA ZEIGER MRN: [id number] Date of Birth: 1960/08/31

## 2021-03-17 ENCOUNTER — Ambulatory Visit (HOSPITAL_COMMUNITY): Payer: Medicaid Other | Admitting: Physical Therapy

## 2021-03-17 ENCOUNTER — Other Ambulatory Visit: Payer: Self-pay

## 2021-03-17 DIAGNOSIS — I890 Lymphedema, not elsewhere classified: Secondary | ICD-10-CM

## 2021-03-17 NOTE — Therapy (Signed)
?OUTPATIENT PHYSICAL THERAPY Lymphedema TREATMENT NOTE ? ? ?Patient Name: [name] ?MRN: [id number] ?DOB:December 08, 1960, 61 y.o., female ?Today's Date: 03/17/2021 ? ?PCP: Christiana Fuchs, DO ?REFERRING PROVIDER: Christiana Fuchs, DO ? ? PT End of Session - 03/17/21 1202   ? ? Visit Number 13   ? Number of Visits 18   ? Date for PT Re-Evaluation 03/13/21   ? Authorization Type Healthy blue   ? Authorization Time Period 15 visits approved 1/23-->02/27/21; re-eval + 12 visits from 2/27-->03/27/21   ? Authorization - Visit Number 6   ? Authorization - Number of Visits 12   ? PT Start Time 1025   ? PT Stop Time 1145   ? PT Time Calculation (min) 80 min   ? Activity Tolerance Patient tolerated treatment well   ? Behavior During Therapy [facility] for tasks assessed/performed   ? ?  ?  ? ?  ? ? ?Past Medical History:  ?Diagnosis Date  ? Acute on chronic diastolic (congestive) heart failure ([place]) 06/25/2013  ? Acute on chronic respiratory failure ([place]) 06/15/2018  ? Anemia, iron deficiency   ? secondary to menhorrhagia, on oral iron, also b12 def, getting monthly b12 shots  ? Cellulitis 05/2019  ? RIGHT LOWER EXTREMITY  ? CHF (congestive heart failure) ([place])   ? Chronic cough   ? secondary to alleriges and post nasal drip  ? Cognitive impairment   ? COPD (chronic obstructive pulmonary disease) ([place])   ? Cor pulmonale (HCC)   ? PA Peak pressure 65mHg  ? Depression   ? Diabetes mellitus   ? well controlled on metformin  ? Diastolic heart failure ([place]   ? GERD (gastroesophageal reflux disease)   ? H/O mental retardation   ? Herpes   ? Hyperlipidemia   ? Hypertension   ? OSA (obstructive sleep apnea)   ? CPAP  ? Pulmonary hypertension ([place]   ? Renal disorder   ? Restrictive lung disease   ? PFTs 06/2012 (FVC 54% predicted and FEV1 68% predicted w minimal bronchodilator response).  ? Shortness of breath   ? Venous stasis ulcer ([place]   ? chornic, ?followed up at wound care center, multiple courses of antibiotics in past for cellulitis, on  lasix  ? ?Past Surgical History:  ?Procedure Laterality Date  ? CARDIAC CATHETERIZATION N/A 01/13/2016  ? Procedure: Right Heart Cath;  Surgeon: DJolaine Artist MD;  [facility] [place] [facility];  Service: Cardiovascular;  Laterality: N/A;  ? CHOLECYSTECTOMY    ? COLONOSCOPY WITH PROPOFOL N/A 12/20/2019  ? Procedure: COLONOSCOPY WITH PROPOFOL;  Surgeon: JMilus Banister MD;  [facility];  Service: Endoscopy;  Laterality: N/A;  ? IR FLUORO GUIDE CV LINE LEFT  11/16/2019  ? IR FLUORO GUIDE CV LINE LEFT  06/17/2020  ? IR FLUORO GUIDE CV LINE LEFT  07/04/2020  ? IR FLUORO GUIDE CV LINE RIGHT  05/09/2019  ? IR FLUORO GUIDE CV LINE RIGHT  08/12/2019  ? IR REMOVAL TUN CV CATH W/O FL  05/05/2019  ? IR [place]IDE VASC ACCESS LEFT  11/16/2019  ? IR [place]IDE VASC ACCESS LEFT  06/17/2020  ? IR [place]IDE VASC ACCESS LEFT  07/04/2020  ? IR [place]IDE VASC ACCESS RIGHT  05/09/2019  ? IR [place]IDE VASC ACCESS RIGHT  08/12/2019  ? IR [place]IDE VASC ACCESS RIGHT  11/16/2019  ? IR VENO/JUGULAR RIGHT  11/16/2019  ? LEFT AND RIGHT HEART CATHETERIZATION WITH CORONARY ANGIOGRAM N/A 02/28/2014  ? Procedure: LEFT  AND RIGHT HEART CATHETERIZATION WITH CORONARY ANGIOGRAM;  Surgeon: Jolaine Artist, MD;  Location: [facility] CATH LAB;  Service: Cardiovascular;  Laterality: N/A;  ? RIGHT HEART CATH N/A 06/27/2018  ? Procedure: RIGHT HEART CATH;  Surgeon: Jolaine Artist, MD;  Location: [place] CV LAB;  Service: Cardiovascular;  Laterality: N/A;  ? RIGHT/LEFT HEART CATH AND CORONARY ANGIOGRAPHY N/A 05/14/2019  ? Procedure: RIGHT/LEFT HEART CATH AND CORONARY ANGIOGRAPHY;  Surgeon: Jolaine Artist, MD;  Location: [place] CV LAB;  Service: Cardiovascular;  Laterality: N/A;  ? ?Patient Active Problem List  ? Diagnosis Date Noted  ? Knee osteoarthritis 12/12/2020  ? Vision changes 09/02/2020  ? Depression 09/02/2020  ? Swelling of lower extremity 08/13/2020  ? Left paraspinal back pain 05/05/2020  ? Hearing difficulty of both ears 05/05/2020  ?  Internal hemorrhoid 01/11/2020  ? External hemorrhoid 01/11/2020  ? Fibroid, uterine 11/07/2019  ? Vagina bleeding 10/15/2019  ? 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 Bleeding from the genitourinary system 09/13/2019  ? Gross hematuria 09/13/2019  ? Chronic venous stasis 05/07/2019  ? Urinary incontinence 12/12/2018  ? 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 Right ovarian cyst 05/19/2017  ? Peripheral neuropathy ([place]) 09/27/2014  ? Healthcare maintenance 09/27/2014  ? Atrial flutter ([place]) 08/17/2012  ? Right heart failure, NYHA class 3 ([place]) 03/15/2012  ? Allergic rhinitis 11/16/2011  ? Diabetes ([place]) 10/15/2010  ? Hyperlipidemia 10/15/2010  ? Obesity 10/14/2010  ? Obstructive sleep apnea 02/21/2009  ? GERD 11/29/2005  ? ? ?REFERRING DIAG: Bilateral LE lymphedema ? ?THERAPY DIAG: Lymphedema, not elsewhere classified ? ?PERTINENT HISTORY: Htn, CHF, COPD, OA, RT LE cellulitis, obesity ? ?PRECAUTIONS: cellulitis ? ?SUBJECTIVE: Pt states transportation was late getting her today.  Reports she has her bandages today and has ordered some for the tops of her legs.  No pain or issues to report. ? ?PAIN:  ?Are you having pain? No ? ? ? ? ?TODAY'S TREATMENT:  ?03/17/2021 ?Manual Therapy   ?  Manual Therapy Manual Lymphatic Drainage (MLD);Compression Bandaging;Other (comment)   ?  Manual therapy comments completed seperate from all other aspects of treatment   ?  Manual Lymphatic Drainage (MLD) supracalvicular, deep and superfical abdominal inguinal/axillary anastomosis for Bil LE anteriorly and posteriorly in sidelying  ?  Compression Bandaging to distal bil LE's 1/2" foam and mulilayer short stretch bandaging.  ?     ? ? ? ?PATIENT EDUCATION: ?Education details: care of short stretch bandaging, using pump when removes and rolling back up; keeping bandaging clean, making appt with podiatrist ?Person educated: Patient ?Education method: Explanation ?Education comprehension: verbalized  understanding ? ? ?HOME EXERCISE PROGRAM: ?sitting: ankle pumps, LAQ, hip ab/adduction, marching,diaphragmic breathing and lymph squeeze   ?  ? ? ? PT Short Term Goals - 03/12/21 1043   ? ?  ? PT SHORT TERM GOAL #1  ? Title Pt to be I in HEP to assist in increasing the lymphatic circulation   ? Baseline 3/9: reviewed importance of HEP compliance   ? Status On-going   ?  ? PT SHORT TERM GOAL #2  ? Title PT to be completing good skin care on a daily basis   ? Baseline 3/9:  Reports understanding of skin care, stated she plans to purchase lotion later today.   ? Status On-going   ?  ? PT SHORT TERM GOAL #3  ? Title PT to have lost 2-3 cm of fluid from B LE to reduce risk  of cellulitis   ? Baseline 03/12/21:  Distal extremities reduced, increased volume Rt thigh.   ? Status Partially Met   ? ?  ?  ? ?  ? ? ? PT Long Term Goals - 03/12/21 1046   ? ?  ? PT LONG TERM GOAL #1  ? Title Pt to have lost 5-6  cm from B LE to reduce risk of cellulitis and to be able to fit into shoes and pants   ? Status Partially Met   ?  ? PT LONG TERM GOAL #2  ? Title PT to have and be using a compression pump   ? Baseline 3/3 has pump has not been able to use it due to compression bandages   ?  ? PT LONG TERM GOAL #3  ? Title PT to have and to be able to don juxtafit   ? Baseline 3/3 sent order today   ? Status Not Met   ? ?  ?  ? ?  ? ? ? Plan - 03/17/21 1205   ? ? Clinical Impression Statement Pt was late for appointment and then had to use the restroom.  Pt came today wtih compression bandaging for distal LE's and states they have ordered bandaging for her thighs.  REmoved profores, cleansed, massaged and applied lotion to distal LE's prior to bandaging.  Educated on care of short stretch and to remove prior to next appt and to use her pump while she is re-rolling her bandages.  Pt verbalized understanding.  Also reminded again to make appt with podiatrist for nail care.   ? Personal Factors and Comorbidities Comorbidity 3+;Fitness;Time  since onset of injury/illness/exacerbation   ? Comorbidities HTN, obesity, CHF, COPD, OA, Hx of cellulitis   ? Examination-Activity Limitations Dressing;Hygiene/Grooming;Locomotion Level;Squat;Stairs   ? Examination-Par

## 2021-03-19 ENCOUNTER — Other Ambulatory Visit: Payer: Self-pay

## 2021-03-19 ENCOUNTER — Ambulatory Visit (HOSPITAL_COMMUNITY): Payer: Medicaid Other | Admitting: Physical Therapy

## 2021-03-19 DIAGNOSIS — I890 Lymphedema, not elsewhere classified: Secondary | ICD-10-CM | POA: Diagnosis not present

## 2021-03-19 NOTE — Therapy (Signed)
?OUTPATIENT PHYSICAL THERAPY Lymphedema TREATMENT NOTE ? ? ?Patient Name: [name] ?MRN: [id number] ?DOB:[birth year], 61 y.o., female ?Today's Date: 03/19/2021 ? ?PCP: Christiana Fuchs, DO ?REFERRING PROVIDER: Christiana Fuchs, DO ? ? PT End of Session - 03/19/21 1740   ? ? Visit Number 14   ? Number of Visits 18   ? Date for PT Re-Evaluation 03/13/21   ? Authorization Type Healthy blue   ? Authorization Time Period 15 visits approved 1/23-->02/27/21; re-eval + 12 visits from 2/27-->03/27/21   ? Authorization - Visit Number 7   ? Authorization - Number of Visits 12   ? PT Start Time 1600   ? PT Stop Time 1710   ? PT Time Calculation (min) 70 min   ? Activity Tolerance Patient tolerated treatment well   ? Behavior During Therapy [facility] for tasks assessed/performed   ? ?  ?  ? ?  ? ? ?Past Medical History:  ?Diagnosis Date  ? Acute on chronic diastolic (congestive) heart failure ([place]) 06/25/2013  ? Acute on chronic respiratory failure ([place]) 06/15/2018  ? Anemia, iron deficiency   ? secondary to menhorrhagia, on oral iron, also b12 def, getting monthly b12 shots  ? Cellulitis 05/2019  ? RIGHT LOWER EXTREMITY  ? CHF (congestive heart failure) ([place])   ? Chronic cough   ? secondary to alleriges and post nasal drip  ? Cognitive impairment   ? COPD (chronic obstructive pulmonary disease) ([place])   ? Cor pulmonale (HCC)   ? PA Peak pressure 41mHg  ? Depression   ? Diabetes mellitus   ? well controlled on metformin  ? Diastolic heart failure ([place]   ? GERD (gastroesophageal reflux disease)   ? H/O mental retardation   ? Herpes   ? Hyperlipidemia   ? Hypertension   ? OSA (obstructive sleep apnea)   ? CPAP  ? Pulmonary hypertension ([place]   ? Renal disorder   ? Restrictive lung disease   ? PFTs 06/2012 (FVC 54% predicted and FEV1 68% predicted w minimal bronchodilator response).  ? Shortness of breath   ? Venous stasis ulcer ([place]   ? chornic, ?followed up at wound care center, multiple courses of antibiotics in past for cellulitis, on  lasix  ? ?Past Surgical History:  ?Procedure Laterality Date  ? CARDIAC CATHETERIZATION N/A 01/13/2016  ? Procedure: Right Heart Cath;  Surgeon: DJolaine Artist MD;  [place] [place];  Service: Cardiovascular;  Laterality: N/A;  ? CHOLECYSTECTOMY    ? COLONOSCOPY WITH PROPOFOL N/A 12/20/2019  ? Procedure: COLONOSCOPY WITH PROPOFOL;  Surgeon: JMilus Banister MD;  [facility];  Service: Endoscopy;  Laterality: N/A;  ? IR FLUORO GUIDE CV LINE LEFT  11/16/2019  ? IR FLUORO GUIDE CV LINE LEFT  06/17/2020  ? IR FLUORO GUIDE CV LINE LEFT  07/04/2020  ? IR FLUORO GUIDE CV LINE RIGHT  05/09/2019  ? IR FLUORO GUIDE CV LINE RIGHT  08/12/2019  ? IR REMOVAL TUN CV CATH W/O FL  05/05/2019  ? IR [place]oreaGUIDE VASC ACCESS LEFT  11/16/2019  ? IR [place]IDE VASC ACCESS LEFT  06/17/2020  ? IR [place]IDE VASC ACCESS LEFT  07/04/2020  ? IR [place]GUIDE VASC ACCESS RIGHT  05/09/2019  ? IR [place]IDE VASC ACCESS RIGHT  08/12/2019  ? IR [place]IDE VASC ACCESS RIGHT  11/16/2019  ? IR VENO/JUGULAR RIGHT  11/16/2019  ? LEFT AND RIGHT HEART CATHETERIZATION WITH CORONARY ANGIOGRAM N/A 02/28/2014  ? Procedure: LEFT  AND RIGHT HEART CATHETERIZATION WITH CORONARY ANGIOGRAM;  Surgeon: Jolaine Artist, MD;  Location[facility] C[facility] LAB;  Service: Cardiovascular;  Laterality: N/A;  ? RIGHT HEART CATH N/A 06/27/2018  ? Procedure: RIGHT HEART CATH;  Surgeon: Jolaine Artist, MD;  Location: [place] CV LAB;  Service: Cardiovascular;  Laterality: N/A;  ? RIGHT/LEFT HEART CATH AND CORONARY ANGIOGRAPHY N/A 05/14/2019  ? Procedure: RIGHT/LEFT HEART CATH AND CORONARY ANGIOGRAPHY;  Surgeon: Jolaine Artist, MD;  Location: [place] CV LAB;  Service: Cardiovascular;  Laterality: N/A;  ? ?Patient Active Problem List  ? Diagnosis Date Noted  ? Knee osteoarthritis 12/12/2020  ? Vision changes 09/02/2020  ? Depression 09/02/2020  ? Swelling of lower extremity 08/13/2020  ? Left paraspinal back pain 05/05/2020  ? Hearing difficulty of both ears 05/05/2020  ?  Internal hemorrhoid 01/11/2020  ? External hemorrhoid 01/11/2020  ? Fibroid, uterine 11/07/2019  ? Vagina bleeding 10/15/2019  ? 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 Bleeding from the genitourinary system 09/13/2019  ? Gross hematuria 09/13/2019  ? Chronic venous stasis 05/07/2019  ? Urinary incontinence 12/12/2018  ? 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 Right ovarian cyst 05/19/2017  ? Peripheral neuropathy ([place]) 09/27/2014  ? Healthcare maintenance 09/27/2014  ? Atrial flutter ([place]) 08/17/2012  ? Right heart failure, NYHA class 3 ([place]) 03/15/2012  ? Allergic rhinitis 11/16/2011  ? Diabetes ([place]) 10/15/2010  ? Hyperlipidemia 10/15/2010  ? Obesity 10/14/2010  ? Obstructive sleep apnea 02/21/2009  ? GERD 11/29/2005  ? ? ?REFERRING DIAG: Bilateral LE lymphedema ? ?THERAPY DIAG: Lymphedema, not elsewhere classified ? ?PERTINENT HISTORY: Htn, CHF, COPD, OA, RT LE cellulitis, obesity ? ?PRECAUTIONS: cellulitis ? ?SUBJECTIVE: Pt states transportation was late getting her here today as they had to stop somewhere for another patent. States her pulmonary hypertension is bothering her today. ? ?PAIN:  ?Are you having pain? No ? ? ? ? ?TODAY'S TREATMENT:  ? ?03/19/2021 ? ?Manual Therapy   ?  Manual Therapy Compression Bandaging;Other (comment)   ?  Manual therapy comments completed seperate from all other aspects of treatment   ?  Manual Lymphatic Drainage (MLD) Not completed today due to time, measurements needed and foam cut for thighs  ?  Compression Bandaging to distal bil LE's 1/2" foam and mulilayer short stretch bandaging.  ?  other Measurements, cutting of foam for thighs  ? ? ? ?03/17/2021 ? ?Manual Therapy   ?  Manual Therapy Manual Lymphatic Drainage (MLD);Compression Bandaging;Other (comment)   ?  Manual therapy comments completed seperate from all other aspects of treatment   ?  Manual Lymphatic Drainage (MLD) supracalvicular, deep and superfical abdominal inguinal/axillary  anastomosis for Bil LE anteriorly and posteriorly in sidelying  ?  Compression Bandaging to distal bil LE's 1/2" foam and mulilayer short stretch bandaging.  ?     ? ?   Right Lower Extremity Lymphedema  Below measurements in centimeters ? Eval 01/07/21  03/12/21 03/19/21  ?20 cm Proximal to Suprapatella 68.5 66  65.5  ?10 cm Proximal to Suprapatella 68.5 67  65  ?At Midpatella/Popliteal Crease  54.5 57.6 56.4  ?30 cm Proximal to Floor at Lateral Plantar Foot 60.2 51.2 54  ?20 cm Proximal to Floor at Lateral Plantar Foot 46.8 41.7 48  ?10 cm Proximal to Floor at Lateral Malleoli 33.5 27.9 29.5  ?Circumference of ankle/heel 37 34.4 35  ?5 cm Proximal to 1st MTP Joint 27.5 25.1 26.4  ?Across MTP Joint 25.5 24.7  24.8  ?     ?Left Lower Extremity Lymphedema    ?20 cm Proximal to Suprapatella 68.2 63  64  ?10 cm Proximal to Suprapatella 67.3 66.7 64  ?At Midpatella/Popliteal Crease  51 48.2 50  ?30 cm Proximal to Floor at Lateral Plantar Foot 62.7 50.3 53.5  ?20 cm Proximal to Floor at Lateral Plantar Foot 44.2 41.3 46.4  ?10 cm Proximal to Floor at Lateral Malleoli 34 27  29.8  ?Circumference of ankle/heel 36.6 33.3 35.5  ?5 cm Proximal to 1st MTP Joint 24.7 24.7 24.5  ?Across MTP Joint  26.6 24.1 24.6  ? ? ?PATIENT EDUCATION: ?Education details: care of short stretch bandaging,not to place in dryer, calling podiatrist as still not done this ?Education method: Explanation ?Education comprehension: verbalized understanding ? ? ?HOME EXERCISE PROGRAM: ?sitting: ankle pumps, LAQ, hip ab/adduction, marching,diaphragmic breathing and lymph squeeze   ?  ? ? ? PT Short Term Goals - 03/12/21 1043   ? ?  ? PT SHORT TERM GOAL #1  ? Title Pt to be I in HEP to assist in increasing the lymphatic circulation   ? Baseline 3/9: reviewed importance of HEP compliance   ? Status On-going   ?  ? PT SHORT TERM GOAL #2  ? Title PT to be completing good skin care on a daily basis   ? Baseline 3/9:  Reports understanding of skin care, stated she  plans to purchase lotion later today.   ? Status On-going   ?  ? PT SHORT TERM GOAL #3  ? Title PT to have lost 2-3 cm of fluid from B LE to reduce risk of cellulitis   ? Baseline 03/12/21:  Distal extremities reduc

## 2021-03-24 ENCOUNTER — Telehealth (HOSPITAL_COMMUNITY): Payer: Self-pay

## 2021-03-24 ENCOUNTER — Encounter (HOSPITAL_COMMUNITY): Payer: Medicaid Other

## 2021-03-24 NOTE — Telephone Encounter (Signed)
Called emergency contact when unable to get in touch with Mrs. Helser, spoke to son who stated he would relay the message to her when home.  Reminded next apt date and time.   ? ?Ihor Austin, LPTA/CLT; CBIS ?[phone] ? ?

## 2021-03-24 NOTE — Addendum Note (Signed)
Addended by: Leeroy Cha on: 03/24/2021 08:50 AM ? ? Modules accepted: Orders ? ?

## 2021-03-24 NOTE — Telephone Encounter (Signed)
No show, attempted to call multiple times with no answer and mailbox full.   ? ?Ihor Austin, LPTA/CLT; CBIS ?[phone] ? ?

## 2021-03-26 ENCOUNTER — Encounter (HOSPITAL_COMMUNITY): Payer: Self-pay | Admitting: Physical Therapy

## 2021-03-26 ENCOUNTER — Other Ambulatory Visit: Payer: Self-pay | Admitting: *Deleted

## 2021-03-26 ENCOUNTER — Other Ambulatory Visit: Payer: Self-pay

## 2021-03-26 ENCOUNTER — Ambulatory Visit (INDEPENDENT_AMBULATORY_CARE_PROVIDER_SITE_OTHER): Payer: Medicaid Other | Admitting: Student

## 2021-03-26 ENCOUNTER — Telehealth: Payer: Self-pay | Admitting: *Deleted

## 2021-03-26 ENCOUNTER — Telehealth (HOSPITAL_COMMUNITY): Payer: Self-pay | Admitting: Physical Therapy

## 2021-03-26 ENCOUNTER — Encounter (HOSPITAL_COMMUNITY): Payer: Medicaid Other | Admitting: Physical Therapy

## 2021-03-26 DIAGNOSIS — R1110 Vomiting, unspecified: Secondary | ICD-10-CM

## 2021-03-26 DIAGNOSIS — R112 Nausea with vomiting, unspecified: Secondary | ICD-10-CM | POA: Diagnosis not present

## 2021-03-26 HISTORY — DX: Vomiting, unspecified: R11.10

## 2021-03-26 NOTE — Patient Outreach (Signed)
Care Coordination ? ?03/26/2021 ? ?[name] ?[birth year] ?[id number] ? ?Successful outreach with [name] today. However, she is not feeling well and had a telephone visit earlier today with her PCP. [name] is resting and denies any needs at this time. RNCM will reach out to [name] next week for a follow up telephone outreach. ? ? ?[name] RN, BSN ?[place] ?RN Care Coordinator ? ?

## 2021-03-26 NOTE — Assessment & Plan Note (Signed)
Patient is presenting via telephone today to discuss vomiting episode today. She describes eating salad and burger yesterday evening and experiencing indigestion when she went to bed. This self-resolved while she was asleep. After she got up this morning, she describes an abrupt-onset of nausea and vomiting. She denies bilious vomiting or hematemesis. Ms. Ireland also endorses intermittent epigastric pain that worsens with food. This has also started this morning. She has been compliant with her twice daily omeprazole but denies NSAID use. This morning, she also had one dark, black bowel movement. She denies taking any anti-emetic yesterday or today. She has not experienced similar bowel movements before. She denies fevers, chills, body aches, lightheadedness, dizziness, orthostasis, syncopal episodes, chest pain, dyspnea, sick contacts, or changes in diet. Of note, she had a COVID-19 infection one month ago and has been vaccinated x3. ? ?Unclear etiology of patient's symptoms. At this juncture she has had one episode of vomiting and one episode of dark bowel movement. She is not endorsing symptoms of dehydration currently. She is able to drink and eat bland foods currently. Patient also has significant barrier with transportation, as the only way she could get to the hospital/clinic is via ambulance. Given her lack of red flag/systemic symptoms, we will manage as outpatient right now. She is at high risk given her significant cardiac history of pulmonary arterial hypertension. I have instructed her to call 911 if she becomes symptomatic at all, including lightheadedness, dizziness, orthostasis. I have also instructed her to call 911 if she continues to have black stools or if she cannot keep food down. She verbalized understanding. I will plan to call her tomorrow to check-in. ? ?- Continue aggressive hydration ?- Return precautions given ?- Will follow-up with her tomorrow ?

## 2021-03-26 NOTE — Telephone Encounter (Signed)
Pt cancelled both appts for this week.  Called and spoke to patient and educated on lymphatic treatment and how missing 1-2 visits erases all progress and you're back to the beginning.  Pt reports she has been nauseated and unsure if she is coming down with a virus or COVID.  Explained if she remains sick next week we may want to hold therapy until she can return on a regular basis.  Evaluating therapist notified of insurance approval expiring tomorrow. ? ?Estiben Mizuno Sula Soda, PTA/CLT, WTA ?[phone] ? ?

## 2021-03-26 NOTE — Telephone Encounter (Signed)
Patient called in c/o 1 day hx vomiting, chills, congestion (could not state if nasal or chest). Also, c/o indigestion that "puts pressure on my lung." Patient has no transportation. Tele appt scheduled today at 1315 with Red Team Provider.  ?

## 2021-03-26 NOTE — Therapy (Unsigned)
[name] ?[place] ?[address] ?[place], [place], [address] ?Phone: [phone]   Fax:  [phone] ? ?Patient Details  ?Name: [name] ?MRN: [id number] ?Date of Birth: [birth year] ?Referring Provider:  No ref. provider found ? ?Encounter Date: 03/26/2021 ? ?Therapist submitted new request for 12 more visits from 3/27-4/21 to healthy blue  ?03/26/2021, 11:22 AM ? ?Rayetta Humphrey, PT CLT ?[phone]  ?[facility] ?[place] ?[address] ?[place], [place], [address] ?Phone: [phone]   Fax:  [phone] ?

## 2021-03-26 NOTE — Progress Notes (Signed)
?[facility] Internal Medicine Residency Telephone Encounter ?Continuity Care Appointment ? ?HPI:  ?This telephone encounter was created for [name] on 03/26/2021 for the following purpose/cc vomiting.  ? ?Past Medical History:  ?Past Medical History:  ?Diagnosis Date  ? Acute on chronic diastolic (congestive) heart failure ([place]) 06/25/2013  ? Acute on chronic respiratory failure ([place]) 06/15/2018  ? Anemia, iron deficiency   ? secondary to menhorrhagia, on oral iron, also b12 def, getting monthly b12 shots  ? Cellulitis 05/2019  ? RIGHT LOWER EXTREMITY  ? CHF (congestive heart failure) ([place])   ? Chronic cough   ? secondary to alleriges and post nasal drip  ? Cognitive impairment   ? COPD (chronic obstructive pulmonary disease) ([place])   ? Cor pulmonale (HCC)   ? PA Peak pressure 71mHg  ? Depression   ? Diabetes mellitus   ? well controlled on metformin  ? Diastolic heart failure ([place]   ? GERD (gastroesophageal reflux disease)   ? H/O mental retardation   ? Herpes   ? Hyperlipidemia   ? Hypertension   ? OSA (obstructive sleep apnea)   ? CPAP  ? Pulmonary hypertension ([place]   ? Renal disorder   ? Restrictive lung disease   ? PFTs 06/2012 (FVC 54% predicted and FEV1 68% predicted w minimal bronchodilator response).  ? Shortness of breath   ? Venous stasis ulcer ([place]   ? chornic, ?followed up at wound care center, multiple courses of antibiotics in past for cellulitis, on lasix  ?  ? ?ROS:  ?As per HPI  ? ?Assessment / Plan / Recommendations:  ?Please see A&P under problem oriented charting for assessment of the patient's acute and chronic medical conditions.  ?As always, pt is advised that if symptoms worsen or new symptoms arise, they should go to an urgent care facility or to to ER for further evaluation.  ? ?Consent and Medical Decision Making:  ?Patient discussed with Dr. VEvette Doffing?This is a telephone encounter between [name] 03/26/2021 for vomiting. The visit was  conducted with the patient located at home [facility] The patient's identity was confirmed using their DOB and current address. The patient has consented to being evaluated through a telephone encounter and understands the associated risks (an examination cannot be done and the patient may need to come in for an appointment) / benefits (allows the patient to remain at home, decreasing exposure to coronavirus). I personally spent 16 minutes on medical discussion.   ? ?Vomiting ?Patient is presenting via telephone today to discuss vomiting episode today. She describes eating salad and burger yesterday evening and experiencing indigestion when she went to bed. This self-resolved while she was asleep. After she got up this morning, she describes an abrupt-onset of nausea and vomiting. She denies bilious vomiting or hematemesis. [name] endorses intermittent epigastric pain that worsens with food. This has also started this morning. She has been compliant with her twice daily omeprazole but denies NSAID use. This morning, she also had one dark, black bowel movement. She denies taking any anti-emetic yesterday or today. She has not experienced similar bowel movements before. She denies fevers, chills, body aches, lightheadedness, dizziness, orthostasis, syncopal episodes, chest pain, dyspnea, sick contacts, or changes in diet. Of note, she had a COVID-19 infection one month ago and has been vaccinated x3. ? ?Unclear etiology of patient's symptoms. At this juncture she has had one episode of vomiting and  one episode of dark bowel movement. She is not endorsing symptoms of dehydration currently. She is able to drink and eat bland foods currently. Patient also has significant barrier with transportation, as the only way she could get to the hospital/clinic is via ambulance. Given her lack of red flag/systemic symptoms, we will manage as outpatient right now. She is at high risk given her significant  cardiac history of pulmonary arterial hypertension. I have instructed her to call 911 if she becomes symptomatic at all, including lightheadedness, dizziness, orthostasis. I have also instructed her to call 911 if she continues to have black stools or if she cannot keep food down. She verbalized understanding. I will plan to call her tomorrow to check-in. ? ?- Continue aggressive hydration ?- Return precautions given ?- Will follow-up with her tomorrow ? ?Faith Dame, MD ?Internal Medicine PGY-2 ?Pager: [phone] ? ?

## 2021-03-27 NOTE — Progress Notes (Signed)
Internal Medicine Clinic Attending ? ?Case discussed with Dr. Braswell  At the time of the visit.  We reviewed the resident?s history and exam and pertinent patient test results.  I agree with the assessment, diagnosis, and plan of care documented in the resident?s note.  ?

## 2021-03-30 ENCOUNTER — Other Ambulatory Visit: Payer: Self-pay | Admitting: *Deleted

## 2021-03-30 ENCOUNTER — Other Ambulatory Visit: Payer: Self-pay | Admitting: Internal Medicine

## 2021-03-30 NOTE — Patient Instructions (Signed)
Visit Information  [name]  - as a part of your Medicaid benefit, you are eligible for care management and care coordination services at no cost or copay. I was unable to reach you by phone today but would be happy to help you with your health related needs. Please feel free to call me @ [phone].   A member of the Managed Medicaid care management team will reach out to you again over the next 14 days.   Jannatul Wojdyla RN, BSN [place]  [facility] RN Care Coordinator   

## 2021-03-30 NOTE — Patient Outreach (Signed)
Care Coordination ? ?03/30/2021 ? ?[name] ?[birth year] ?[id number] ? ? ?Medicaid Managed Care  ? ?Unsuccessful Outreach Note ? ?03/30/2021 ?Name: [name] MRN: [id number] DOB: 1960-02-02 ? ?Referred by: Masters, [name], DO ?Reason for referral : High Risk Managed Medicaid (Unsuccessful RNCM follow up telephone outreach) ? ? ?An unsuccessful telephone outreach was attempted today. The patient was referred to the case management team for assistance with care management and care coordination.  ? ?Follow Up Plan: The care management team will reach out to the patient again over the next 14 days.  ? ?[name] RN, BSN ?[place] ?RN Care Coordinator ? ? ?

## 2021-04-01 ENCOUNTER — Other Ambulatory Visit: Payer: Self-pay | Admitting: Internal Medicine

## 2021-04-01 DIAGNOSIS — I5033 Acute on chronic diastolic (congestive) heart failure: Secondary | ICD-10-CM

## 2021-04-02 ENCOUNTER — Telehealth: Payer: Self-pay

## 2021-04-02 NOTE — Telephone Encounter (Signed)
Pt is requesting a call back .. she stated that she called the # on her insurance card to set up her transportation but they stated she needed to contact her Dr office   ?

## 2021-04-02 NOTE — Telephone Encounter (Signed)
Call to patient about need for Clinics to set up her appointments for transportation.  Unable to do so because of her phone service type.   Call to [employer] at [phone].  Transportation was set up for patient and pick up times  04/13/2021 -Code 62487. 04/15/2021 Code 60084  04/17/2021 Code 44458 Pick up time 11:50 AM.  04/22/2021  Code 48350 . 04/24/2021 49786 pick up time 11:50 AM.  04/28/2021 Code- 75732 Pick up time 9:20 AM  04/29/2021 Code 25672 Pick up time 9:20 AM.  05/01/2021 Code 09198 Pick up time 6:35 AM.   05/04/2021 Code 531 Pick up time 11:50 AM.  All return times are Will Call.  Patient was called and given information for pick  times and confirmation numbers.  Will need to call the middle of April for the May appointments. ?

## 2021-04-04 DIAGNOSIS — I270 Primary pulmonary hypertension: Secondary | ICD-10-CM | POA: Diagnosis not present

## 2021-04-04 DIAGNOSIS — G4730 Sleep apnea, unspecified: Secondary | ICD-10-CM | POA: Diagnosis not present

## 2021-04-04 DIAGNOSIS — I5030 Unspecified diastolic (congestive) heart failure: Secondary | ICD-10-CM | POA: Diagnosis not present

## 2021-04-04 DIAGNOSIS — J984 Other disorders of lung: Secondary | ICD-10-CM | POA: Diagnosis not present

## 2021-04-06 ENCOUNTER — Ambulatory Visit (HOSPITAL_COMMUNITY): Payer: Medicaid Other | Attending: Internal Medicine | Admitting: Physical Therapy

## 2021-04-06 DIAGNOSIS — I890 Lymphedema, not elsewhere classified: Secondary | ICD-10-CM | POA: Diagnosis not present

## 2021-04-06 NOTE — Therapy (Signed)
?OUTPATIENT PHYSICAL THERAPY Lymphedema TREATMENT NOTE ? ? ?Patient Name: [name] ?MRN: [id number] ?DOB:1960/01/08, 61 y.o., female ?Today's Date: 04/06/2021 ? ?PCP: Christiana Fuchs, DO ?REFERRING PROVIDER: Aldine Contes, MD ? ? PT End of Session - 04/06/21 1437   ? ? Visit Number 15   ? Number of Visits 18   ? Date for PT Re-Evaluation 03/13/21   ? Authorization Type [employer]   ? Authorization Time Period 15 visits approved 1/23-->02/27/21; re-eval + 12 visits from 2/27-->03/27/21; 6 visits approved 3/27-4/21   ? Authorization - Visit Number 1   ? Authorization - Number of Visits 6   ? PT Start Time 1320   ? PT Stop Time 6606   ? PT Time Calculation (min) 73 min   ? Activity Tolerance Patient tolerated treatment well   ? Behavior During Therapy [facility] for tasks assessed/performed   ? ?  ?  ? ?  ? ? ?Past Medical History:  ?Diagnosis Date  ? Acute on chronic diastolic (congestive) heart failure ([place]) 06/25/2013  ? Acute on chronic respiratory failure ([place]) 06/15/2018  ? Anemia, iron deficiency   ? secondary to menhorrhagia, on oral iron, also b12 def, getting monthly b12 shots  ? Cellulitis 05/2019  ? RIGHT LOWER EXTREMITY  ? CHF (congestive heart failure) ([place])   ? Chronic cough   ? secondary to alleriges and post nasal drip  ? Cognitive impairment   ? COPD (chronic obstructive pulmonary disease) ([place])   ? Cor pulmonale (HCC)   ? PA Peak pressure 81mHg  ? Depression   ? Diabetes mellitus   ? well controlled on metformin  ? Diastolic heart failure ([place]   ? GERD (gastroesophageal reflux disease)   ? H/O mental retardation   ? Herpes   ? Hyperlipidemia   ? Hypertension   ? OSA (obstructive sleep apnea)   ? CPAP  ? Pulmonary hypertension ([place]   ? Renal disorder   ? Restrictive lung disease   ? PFTs 06/2012 (FVC 54% predicted and FEV1 68% predicted w minimal bronchodilator response).  ? Shortness of breath   ? Venous stasis ulcer ([place]   ? chornic, ?followed up at wound care center, multiple courses of  antibiotics in past for cellulitis, on lasix  ? ?Past Surgical History:  ?Procedure Laterality Date  ? CARDIAC CATHETERIZATION N/A 01/13/2016  ? Procedure: Right Heart Cath;  Surgeon: DJolaine Artist MD;  [facility];  Service: Cardiovascular;  Laterality: N/A;  ? CHOLECYSTECTOMY    ? COLONOSCOPY WITH PROPOFOL N/A 12/20/2019  ? Procedure: COLONOSCOPY WITH PROPOFOL;  Surgeon: JMilus Banister MD;  Location: [facility] ENDOSCOPY;  Service: Endoscopy;  Laterality: N/A;  ? IR FLUORO GUIDE CV LINE LEFT  11/16/2019  ? IR FLUORO GUIDE CV LINE LEFT  06/17/2020  ? IR FLUORO GUIDE CV LINE LEFT  07/04/2020  ? IR FLUORO GUIDE CV LINE RIGHT  05/09/2019  ? IR FLUORO GUIDE CV LINE RIGHT  08/12/2019  ? IR REMOVAL TUN CV CATH W/O FL  05/05/2019  ? IR [place] ACCESS LEFT  11/16/2019  ? IR [place] VASC ACCESS LEFT  06/17/2020  ? IR [place] VASC ACCESS LEFT  07/04/2020  ? IR [place] VASC ACCESS RIGHT  05/09/2019  ? IR [place] VASC ACCESS RIGHT  08/12/2019  ? IR [place] VASC ACCESS RIGHT  11/16/2019  ? IR VENO/JUGULAR RIGHT  11/16/2019  ? LEFT AND RIGHT HEART CATHETERIZATION WITH CORONARY ANGIOGRAM N/A 02/28/2014  ?  Procedure: LEFT AND RIGHT HEART CATHETERIZATION WITH CORONARY ANGIOGRAM;  Surgeon: Jolaine Artist, MD;  Location: [facility] CATH LAB;  Service: Cardiovascular;  Laterality: N/A;  ? RIGHT HEART CATH N/A 06/27/2018  ? Procedure: RIGHT HEART CATH;  Surgeon: Jolaine Artist, MD;  Location: [place] CV LAB;  Service: Cardiovascular;  Laterality: N/A;  ? RIGHT/LEFT HEART CATH AND CORONARY ANGIOGRAPHY N/A 05/14/2019  ? Procedure: RIGHT/LEFT HEART CATH AND CORONARY ANGIOGRAPHY;  Surgeon: Jolaine Artist, MD;  Location: [place] CV LAB;  Service: Cardiovascular;  Laterality: N/A;  ? ?Patient Active Problem List  ? Diagnosis Date Noted  ? Vomiting 03/26/2021  ? Knee osteoarthritis 12/12/2020  ? Vision changes 09/02/2020  ? Depression 09/02/2020  ? Swelling of lower extremity 08/13/2020  ? Left paraspinal back pain  05/05/2020  ? Hearing difficulty of both ears 05/05/2020  ? Internal hemorrhoid 01/11/2020  ? External hemorrhoid 01/11/2020  ? Fibroid, uterine 11/07/2019  ? Vagina bleeding 10/15/2019  ? 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 Bleeding from the genitourinary system 09/13/2019  ? Gross hematuria 09/13/2019  ? Chronic venous stasis 05/07/2019  ? Urinary incontinence 12/12/2018  ? 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 Right ovarian cyst 05/19/2017  ? Peripheral neuropathy ([place]) 09/27/2014  ? Healthcare maintenance 09/27/2014  ? Atrial flutter ([place]) 08/17/2012  ? Right heart failure, NYHA class 3 ([place]) 03/15/2012  ? Allergic rhinitis 11/16/2011  ? Diabetes ([place]) 10/15/2010  ? Hyperlipidemia 10/15/2010  ? Obesity 10/14/2010  ? Obstructive sleep apnea 02/21/2009  ? GERD 11/29/2005  ? ? ?REFERRING DIAG: Bilateral LE lymphedema ? ?THERAPY DIAG: Lymphedema, not elsewhere classified ? ?PERTINENT HISTORY: Htn, CHF, COPD, OA, RT LE cellulitis, obesity ? ?PRECAUTIONS: cellulitis ? ?SUBJECTIVE: Pt returns today after nearly 3 week absence due to transportation issues.  Pt reports she used her pump twice within this time and has not made a podiatry appt yet.  PT states she has increased shortness of breath and intends on calling her cardiologist when she gets home for an appointment.  Pt is currently on 4L O2 via nasal canula. ? ?PAIN:  ?Are you having pain? No ? ? ? ? ?TODAY'S TREATMENT:  ? ?03/19/2021 ? ?Manual Therapy   ?  Manual Therapy Compression Bandaging;Other (comment)   ?  Manual therapy comments completed seperate from all other aspects of treatment   ?  Manual Lymphatic Drainage (MLD) supracalvicular, deep and superfical abdominal inguinal/axillary anastomosis for Bil LE anteriorly only today  ?  Compression Bandaging to distal bil LE's 1/2" foam and mulilayer short stretch bandaging.  ?  other Measurements  ? ? ? ?03/17/2021 ? ?Manual Therapy   ?  Manual Therapy Manual Lymphatic  Drainage (MLD);Compression Bandaging;Other (comment)   ?  Manual therapy comments completed seperate from all other aspects of treatment   ?  Manual Lymphatic Drainage (MLD) supracalvicular, deep and superfical abdominal inguinal/axillary anastomosis for Bil LE anteriorly and posteriorly in sidelying  ?  Compression Bandaging to distal bil LE's 1/2" foam and mulilayer short stretch bandaging.  ?     ? ?   Right Lower Extremity Lymphedema  Below measurements in centimeters ? Eval 01/07/21  03/12/21 03/19/21 04/06/21  ?20 cm Proximal to Suprapatella 68.5 66  65.5 67.5  ?10 cm Proximal to Suprapatella 68.5 67  65 66.5  ?At Midpatella/Popliteal Crease  54.5 57.6 56.4 54  ?30 cm Proximal to Floor at Lateral Plantar Foot 60.2 51.2 54 54.5  ?20 cm Proximal to Floor  at Lateral Plantar Foot 46.8 41.7 48 48  ?10 cm Proximal to Floor at Lateral Malleoli 33.5 27.9 29.5 30.5  ?Circumference of ankle/heel 37 34.4 35 36.5  ?5 cm Proximal to 1st MTP Joint 27.5 25.1 26.4 26.5  ?Across MTP Joint 25.5 24.7 24.8 25  ?      ?Left Lower Extremity Lymphedema     ?20 cm Proximal to Suprapatella 68.2 63  64 65  ?10 cm Proximal to Suprapatella 67.3 66.7 64 67  ?At Midpatella/Popliteal Crease  51 48.2 50 49  ?30 cm Proximal to Floor at Lateral Plantar Foot 62.7 50.3 53.5 56  ?20 cm Proximal to Floor at Lateral Plantar Foot 44.2 41.3 46.4 48.8  ?10 cm Proximal to Floor at Lateral Malleoli 34 27  29.8 32  ?Circumference of ankle/heel 36.6 33.3 35.5 36.4  ?5 cm Proximal to 1st MTP Joint 24.7 24.7 24.5 25.5  ?Across MTP Joint  26.6 24.1 24.6 25  ? ? ?PATIENT EDUCATION: ?Education details: MAKE A PODIATRY APPT!!.  Use pump when removes bandages, increase activity with ambulation and continue with HEP ?Education method: Explanation ?Education comprehension: verbalized understanding ? ? ?HOME EXERCISE PROGRAM: ?sitting: ankle pumps, LAQ, hip ab/adduction, marching,diaphragmic breathing and lymph squeeze   ?  ? ? ? PT Short Term Goals - 03/12/21 1043   ? ?   ? PT SHORT TERM GOAL #1  ? Title Pt to be I in HEP to assist in increasing the lymphatic circulation   ? Baseline 3/9: reviewed importance of HEP compliance   ? Status On-going   ?  ? PT SHORT TERM GOAL #2  ? Title PT

## 2021-04-07 ENCOUNTER — Other Ambulatory Visit: Payer: Self-pay | Admitting: *Deleted

## 2021-04-07 NOTE — Patient Outreach (Signed)
Care Coordination ? ?04/07/2021 ? ?[name] ?[birth year] ?[id number] ? ? ? ?Medicaid Managed Care  ? ?Unsuccessful Outreach Note ? ?04/07/2021 ?Name: [name] MRN: [id number] DOB: 1960/07/12 ? ?Referred by: Masters, [name], DO ?Reason for referral : High Risk Managed Medicaid (Unsuccessful RNCM follow up telephone outreach, 2nd attempt) ? ? ?A second unsuccessful telephone outreach was attempted today. The patient was referred to the case management team for assistance with care management and care coordination.  ? ?Follow Up Plan: A HIPAA compliant phone message was left for the patient providing contact information and requesting a return call.  ? ?[name] RN, BSN ?[place] ?RN Care Coordinator ? ? ?

## 2021-04-07 NOTE — Patient Instructions (Signed)
Visit Information  [name]  - as a part of your Medicaid benefit, you are eligible for care management and care coordination services at no cost or copay. I was unable to reach you by phone today but would be happy to help you with your health related needs. Please feel free to call me @ [phone].   A member of the Managed Medicaid care management team will reach out to you again over the next 14 days.   Daylan Boggess RN, BSN [place]  [facility] RN Care Coordinator   

## 2021-04-08 ENCOUNTER — Ambulatory Visit (HOSPITAL_COMMUNITY): Payer: Medicaid Other | Admitting: Physical Therapy

## 2021-04-08 DIAGNOSIS — I890 Lymphedema, not elsewhere classified: Secondary | ICD-10-CM

## 2021-04-08 NOTE — Therapy (Signed)
?OUTPATIENT PHYSICAL THERAPY Lymphedema TREATMENT NOTE ? ? ?Patient Name: [name] ?MRN: [id number] ?DOB:07-26-1960, 61 y.o., female ?Today's Date: 04/08/2021 ? ?PCP: Christiana Fuchs, DO ?REFERRING PROVIDER: Christiana Fuchs, DO ? ? PT End of Session - 04/08/21 1038   ? ? Visit Number 16   ? Number of Visits 20   ? Date for PT Re-Evaluation 04/24/21   ? Authorization Type Healthy blue   ? Authorization Time Period 15 visits approved 1/23-->02/27/21; re-eval + 12 visits from 2/27-->03/27/21; 6 visits approved 3/27-4/21   ? Authorization - Visit Number 2   ? Authorization - Number of Visits 6   ? Progress Note Due on Visit 20   ? PT Start Time 1040   ? PT Stop Time 1155   ? PT Time Calculation (min) 75 min   ? Activity Tolerance Patient tolerated treatment well   ? Behavior During Therapy [facility] for tasks assessed/performed   ? ?  ?  ? ?  ? ? ?Past Medical History:  ?Diagnosis Date  ? Acute on chronic diastolic (congestive) heart failure ([place]) 06/25/2013  ? Acute on chronic respiratory failure ([place]) 06/15/2018  ? Anemia, iron deficiency   ? secondary to menhorrhagia, on oral iron, also b12 def, getting monthly b12 shots  ? Cellulitis 05/2019  ? RIGHT LOWER EXTREMITY  ? CHF (congestive heart failure) ([place])   ? Chronic cough   ? secondary to alleriges and post nasal drip  ? Cognitive impairment   ? COPD (chronic obstructive pulmonary disease) ([place])   ? Cor pulmonale (HCC)   ? PA Peak pressure 53mHg  ? Depression   ? Diabetes mellitus   ? well controlled on metformin  ? Diastolic heart failure ([place]   ? GERD (gastroesophageal reflux disease)   ? H/O mental retardation   ? Herpes   ? Hyperlipidemia   ? Hypertension   ? OSA (obstructive sleep apnea)   ? CPAP  ? Pulmonary hypertension ([place]   ? Renal disorder   ? Restrictive lung disease   ? PFTs 06/2012 (FVC 54% predicted and FEV1 68% predicted w minimal bronchodilator response).  ? Shortness of breath   ? Venous stasis ulcer ([place]   ? chornic, ?followed up at wound care  center, multiple courses of antibiotics in past for cellulitis, on lasix  ? ?Past Surgical History:  ?Procedure Laterality Date  ? CARDIAC CATHETERIZATION N/A 01/13/2016  ? Procedure: Right Heart Cath;  Surgeon: DJolaine Artist MD;  [facility];  Service: Cardiovascular;  Laterality: N/A;  ? CHOLECYSTECTOMY    ? COLONOSCOPY WITH PROPOFOL N/A 12/20/2019  ? Procedure: COLONOSCOPY WITH PROPOFOL;  Surgeon: JMilus Banister MD;  [facility]: [facility] ENDOS[facility];  Service: Endoscopy;  Laterality: N/A;  ? IR FLUORO GUIDE CV LINE LEFT  11/16/2019  ? IR FLUORO GUIDE CV LINE LEFT  06/17/2020  ? IR FLUORO GUIDE CV LINE LEFT  07/04/2020  ? IR FLUORO GUIDE CV LINE RIGHT  05/09/2019  ? IR FLUORO GUIDE CV LINE RIGHT  08/12/2019  ? IR REMOVAL TUN CV CATH W/O FL  05/05/2019  ? IR [place]IDE VASC ACCESS LEFT  11/16/2019  ? IR [place]IDE VASC ACCESS LEFT  06/17/2020  ? IR [place]IDE VASC ACCESS LEFT  07/04/2020  ? IR [place]IDE VASC ACCESS RIGHT  05/09/2019  ? IR [place] VASC ACCESS RIGHT  08/12/2019  ? IR [place]IDE VASC ACCESS RIGHT  11/16/2019  ? IR VENO/JUGULAR RIGHT  11/16/2019  ? LEFT  AND RIGHT HEART CATHETERIZATION WITH CORONARY ANGIOGRAM N/A 02/28/2014  ? Procedure: LEFT AND RIGHT HEART CATHETERIZATION WITH CORONARY ANGIOGRAM;  Surgeon: Jolaine Artist, MD;  Location: [facility] CATH LAB;  Service: Cardiovascular;  Laterality: N/A;  ? RIGHT HEART CATH N/A 06/27/2018  ? Procedure: RIGHT HEART CATH;  Surgeon: Jolaine Artist, MD;  Location: [place] CV LAB;  Service: Cardiovascular;  Laterality: N/A;  ? RIGHT/LEFT HEART CATH AND CORONARY ANGIOGRAPHY N/A 05/14/2019  ? Procedure: RIGHT/LEFT HEART CATH AND CORONARY ANGIOGRAPHY;  Surgeon: Jolaine Artist, MD;  Location: [place] CV LAB;  Service: Cardiovascular;  Laterality: N/A;  ? ?Patient Active Problem List  ? Diagnosis Date Noted  ? Vomiting 03/26/2021  ? Knee osteoarthritis 12/12/2020  ? Vision changes 09/02/2020  ? Depression 09/02/2020  ? Swelling of lower extremity 08/13/2020  ?  Left paraspinal back pain 05/05/2020  ? Hearing difficulty of both ears 05/05/2020  ? Internal hemorrhoid 01/11/2020  ? External hemorrhoid 01/11/2020  ? Fibroid, uterine 11/07/2019  ? Vagina bleeding 10/15/2019  ? 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 Bleeding from the genitourinary system 09/13/2019  ? Gross hematuria 09/13/2019  ? Chronic venous stasis 05/07/2019  ? Urinary incontinence 12/12/2018  ? 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 Right ovarian cyst 05/19/2017  ? Peripheral neuropathy ([place]) 09/27/2014  ? Healthcare maintenance 09/27/2014  ? Atrial flutter ([place]) 08/17/2012  ? Right heart failure, NYHA class 3 ([place]) 03/15/2012  ? Allergic rhinitis 11/16/2011  ? Diabetes ([place]) 10/15/2010  ? Hyperlipidemia 10/15/2010  ? Obesity 10/14/2010  ? Obstructive sleep apnea 02/21/2009  ? GERD 11/29/2005  ? ? ?REFERRING DIAG: Bilateral LE lymphedema ? ?THERAPY DIAG: Lymphedema, not elsewhere classified ? ?PERTINENT HISTORY: Htn, CHF, COPD, OA, RT LE cellulitis, obesity ? ?PRECAUTIONS: cellulitis ? ?SUBJECTIVE: Pt reports a little better breathing today.  States she made a podiatry appt for sometime next week but did not call her cardiologist.  Currently without pain today. ?PAIN:  ?Are you having pain? No ? ? ? ? ?TODAY'S TREATMENT:  ? ?04/08/2021 ? ?Manual Therapy   ?  Manual Therapy Compression Bandaging;Other (comment)   ?  Manual therapy comments completed seperate from all other aspects of treatment   ?  Manual Lymphatic Drainage (MLD) supracalvicular, deep and superfical abdominal inguinal/axillary anastomosis for Bil LE anteriorly only today  ?  Compression Bandaging to distal bil LE's 1/2" foam and mulilayer short stretch bandaging with addition of Lt thigh using 2 12cm short stretch rolls (double length equaling 4 regular rolls)  ? ? ?03/19/2021 ? ?Manual Therapy   ?  Manual Therapy Compression Bandaging;Other (comment)   ?  Manual therapy comments completed seperate from all  other aspects of treatment   ?  Manual Lymphatic Drainage (MLD) supracalvicular, deep and superfical abdominal inguinal/axillary anastomosis for Bil LE anteriorly only today  ?  Compression Bandaging to distal bil LE's 1/2" foam and mulilayer short stretch bandaging.  ?  other Measurements  ? ? ? ?03/17/2021 ? ?Manual Therapy   ?  Manual Therapy Manual Lymphatic Drainage (MLD);Compression Bandaging;Other (comment)   ?  Manual therapy comments completed seperate from all other aspects of treatment   ?  Manual Lymphatic Drainage (MLD) supracalvicular, deep and superfical abdominal inguinal/axillary anastomosis for Bil LE anteriorly and posteriorly in sidelying  ?  Compression Bandaging to distal bil LE's 1/2" foam and mulilayer short stretch bandaging.  ?     ? ?   Right Lower Extremity Lymphedema  Below  measurements in centimeters ? Eval 01/07/21  03/12/21 03/19/21 04/06/21  ?20 cm Proximal to Suprapatella 68.5 66  65.5 67.5  ?10 cm Proximal to Suprapatella 68.5 67  65 66.5  ?At Midpatella/Popliteal Crease  54.5 57.6 56.4 54  ?30 cm Proximal to Floor at Lateral Plantar Foot 60.2 51.2 54 54.5  ?20 cm Proximal to Floor at Lateral Plantar Foot 46.8 41.7 48 48  ?10 cm Proximal to Floor at Lateral Malleoli 33.5 27.9 29.5 30.5  ?Circumference of ankle/heel 37 34.4 35 36.5  ?5 cm Proximal to 1st MTP Joint 27.5 25.1 26.4 26.5  ?Across MTP Joint 25.5 24.7 24.8 25  ?      ?Left Lower Extremity Lymphedema     ?20 cm Proximal to Suprapatella 68.2 63  64 65  ?10 cm Proximal to Suprapatella 67.3 66.7 64 67  ?At Midpatella/Popliteal Crease  51 48.2 50 49  ?30 cm Proximal to Floor at Lateral Plantar Foot 62.7 50.3 53.5 56  ?20 cm Proximal to Floor at Lateral Plantar Foot 44.2 41.3 46.4 48.8  ?10 cm Proximal to Floor at Lateral Malleoli 34 27  29.8 32  ?Circumference of ankle/heel 36.6 33.3 35.5 36.4  ?5 cm Proximal to 1st MTP Joint 24.7 24.7 24.5 25.5  ?Across MTP Joint  26.6 24.1 24.6 25  ? ? ?PATIENT EDUCATION: ?Education details: Use  pump when removes bandages, increase activity with ambulation and continue with HEP ?Education method: Explanation ?Education comprehension: verbalized understanding ? ? ?HOME EXERCISE PROGRAM: ?sitting: ankle pump

## 2021-04-09 ENCOUNTER — Encounter (HOSPITAL_COMMUNITY): Payer: Self-pay

## 2021-04-09 ENCOUNTER — Ambulatory Visit (HOSPITAL_COMMUNITY): Payer: Medicaid Other

## 2021-04-09 DIAGNOSIS — I890 Lymphedema, not elsewhere classified: Secondary | ICD-10-CM

## 2021-04-09 NOTE — Therapy (Addendum)
?OUTPATIENT PHYSICAL THERAPY Lymphedema TREATMENT NOTE ? ? ?Patient Name: [name] ?MRN: [id number] ?DOB:1960/08/26, 61 y.o., female ?Today's Date: 04/09/2021 ? ?PCP: Christiana Fuchs, DO ?REFERRING PROVIDER: Aldine Contes, MD ? ? PT End of Session - 04/09/21 0947   ? ? Visit Number 17   ? Number of Visits 20   ? Date for PT Re-Evaluation 04/24/21   ? Authorization Type Healthy blue   ? Authorization Time Period 15 visits approved 1/23-->02/27/21; re-eval + 12 visits from 2/27-->03/27/21; 6 visits approved 3/27-4/21   ? Authorization - Visit Number 3   ? Authorization - Number of Visits 6   ? Progress Note Due on Visit 20   ? PT Start Time [phone]   Delayed due to restroom break  ? PT Stop Time [phone]   ? PT Time Calculation (min) 71 min   ? Activity Tolerance Patient tolerated treatment well   ? Behavior During Therapy [facility] for tasks assessed/performed   ? ?  ?  ? ?  ? ? ?Past Medical History:  ?Diagnosis Date  ? Acute on chronic diastolic (congestive) heart failure ([place]) 06/25/2013  ? Acute on chronic respiratory failure ([place]) 06/15/2018  ? Anemia, iron deficiency   ? secondary to menhorrhagia, on oral iron, also b12 def, getting monthly b12 shots  ? Cellulitis 05/2019  ? RIGHT LOWER EXTREMITY  ? CHF (congestive heart failure) ([place])   ? Chronic cough   ? secondary to alleriges and post nasal drip  ? Cognitive impairment   ? COPD (chronic obstructive pulmonary disease) ([place])   ? Cor pulmonale (HCC)   ? PA Peak pressure 43mHg  ? Depression   ? Diabetes mellitus   ? well controlled on metformin  ? Diastolic heart failure ([place]   ? GERD (gastroesophageal reflux disease)   ? H/O mental retardation   ? Herpes   ? Hyperlipidemia   ? Hypertension   ? OSA (obstructive sleep apnea)   ? CPAP  ? Pulmonary hypertension ([place]   ? Renal disorder   ? Restrictive lung disease   ? PFTs 06/2012 (FVC 54% predicted and FEV1 68% predicted w minimal bronchodilator response).  ? Shortness of breath   ? Venous stasis ulcer ([place]   ? chornic,  ?followed up at wound care center, multiple courses of antibiotics in past for cellulitis, on lasix  ? ?Past Surgical History:  ?Procedure Laterality Date  ? CARDIAC CATHETERIZATION N/A 01/13/2016  ? Procedure: Right Heart Cath;  Surgeon: DJolaine Artist MD;  [facility];  Service: Cardiovascular;  Laterality: N/A;  ? CHOLECYSTECTOMY    ? COLONOSCOPY WITH PROPOFOL N/A 12/20/2019  ? Procedure: COLONOSCOPY WITH PROPOFOL;  Surgeon: JMilus Banister MD;  [facility];  Service: Endoscopy;  Laterality: N/A;  ? IR FLUORO GUIDE CV LINE LEFT  11/16/2019  ? IR FLUORO GUIDE CV LINE LEFT  06/17/2020  ? IR FLUORO GUIDE CV LINE LEFT  07/04/2020  ? IR FLUORO GUIDE CV LINE RIGHT  05/09/2019  ? IR FLUORO GUIDE CV LINE RIGHT  08/12/2019  ? IR REMOVAL TUN CV CATH W/O FL  05/05/2019  ? IR [place]oreaGUIDE VASC ACCESS LEFT  11/16/2019  ? IR [place]GUIDE VASC ACCESS LEFT  06/17/2020  ? IR [place]GUIDE VASC ACCESS LEFT  07/04/2020  ? IR [place]GUIDE VASC ACCESS RIGHT  05/09/2019  ? IR [place]GUIDE VASC ACCESS RIGHT  08/12/2019  ? IR [place]GUIDE VASC ACCESS RIGHT  11/16/2019  ? IR VENO/JUGULAR  RIGHT  11/16/2019  ? LEFT AND RIGHT HEART CATHETERIZATION WITH CORONARY ANGIOGRAM N/A 02/28/2014  ? Procedure: LEFT AND RIGHT HEART CATHETERIZATION WITH CORONARY ANGIOGRAM;  Surgeon: Jolaine Artist, MD;  Location: [facility] CATH LAB;  Service: Cardiovascular;  Laterality: N/A;  ? RIGHT HEART CATH N/A 06/27/2018  ? Procedure: RIGHT HEART CATH;  Surgeon: Jolaine Artist, MD;  Location: [place] CV LAB;  Service: Cardiovascular;  Laterality: N/A;  ? RIGHT/LEFT HEART CATH AND CORONARY ANGIOGRAPHY N/A 05/14/2019  ? Procedure: RIGHT/LEFT HEART CATH AND CORONARY ANGIOGRAPHY;  Surgeon: Jolaine Artist, MD;  Location: [place] CV LAB;  Service: Cardiovascular;  Laterality: N/A;  ? ?Patient Active Problem List  ? Diagnosis Date Noted  ? Vomiting 03/26/2021  ? Knee osteoarthritis 12/12/2020  ? Vision changes 09/02/2020  ? Depression 09/02/2020  ? Swelling of lower  extremity 08/13/2020  ? Left paraspinal back pain 05/05/2020  ? Hearing difficulty of both ears 05/05/2020  ? Internal hemorrhoid 01/11/2020  ? External hemorrhoid 01/11/2020  ? Fibroid, uterine 11/07/2019  ? Vagina bleeding 10/15/2019  ? 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 Bleeding from the genitourinary system 09/13/2019  ? Gross hematuria 09/13/2019  ? Chronic venous stasis 05/07/2019  ? Urinary incontinence 12/12/2018  ? 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 Right ovarian cyst 05/19/2017  ? Peripheral neuropathy ([place]) 09/27/2014  ? Healthcare maintenance 09/27/2014  ? Atrial flutter ([place]) 08/17/2012  ? Right heart failure, NYHA class 3 ([place]) 03/15/2012  ? Allergic rhinitis 11/16/2011  ? Diabetes ([place]) 10/15/2010  ? Hyperlipidemia 10/15/2010  ? Obesity 10/14/2010  ? Obstructive sleep apnea 02/21/2009  ? GERD 11/29/2005  ? ? ?REFERRING DIAG: Bilateral LE lymphedema ? ?THERAPY DIAG: Lymphedema, not elsewhere classified ? ?PERTINENT HISTORY: Htn, CHF, COPD, OA, RT LE cellulitis, obesity ? ?PRECAUTIONS: cellulitis ? ?SUBJECTIVE: Pt stated her allergies are bothering her some today.  Stated she could tolerate 1 thigh, doesn't feel she can walk with both on. ?PAIN:  ?Are you having pain? No ? ? ? ? ?TODAY'S TREATMENT:  ?04/09/21: ? ? 04/09/21 0001  ?Manual Therapy  ?Manual Therapy Manual Lymphatic Drainage (MLD);Compression Bandaging;Other (comment)  ?Manual therapy comments completed seperate from all other aspects of treatment  ?Manual Lymphatic Drainage (MLD) supracalvicular, deep and superfical abdominal inguinal/axillary anastomosis f/b B LE; posterior not  completed due to pt being late.  ?Compression Bandaging to distal bil LE's 1/2" foam and mulilayer short stretch bandaging with addition of Lt thigh using 2 12cm short stretch rolls (double length equaling 4 regular rolls)  ? ?04/08/2021 ? ?Manual Therapy   ?  Manual Therapy Compression Bandaging;Other (comment)   ?  Manual  therapy comments completed seperate from all other aspects of treatment   ?  Manual Lymphatic Drainage (MLD) supracalvicular, deep and superfical abdominal inguinal/axillary anastomosis for Bil LE anteriorly only today  ?  Compression Bandaging to distal bil LE's 1/2" foam and mulilayer short stretch bandaging with addition of Lt thigh using 2 12cm short stretch rolls (double length equaling 4 regular rolls)  ? ? ?03/19/2021 ? ?Manual Therapy   ?  Manual Therapy Compression Bandaging;Other (comment)   ?  Manual therapy comments completed seperate from all other aspects of treatment   ?  Manual Lymphatic Drainage (MLD) supracalvicular, deep and superfical abdominal inguinal/axillary anastomosis for Bil LE anteriorly only today  ?  Compression Bandaging to distal bil LE's 1/2" foam and mulilayer short stretch bandaging.  ?  other Measurements  ? ? ? ?  03/17/2021 ? ?Manual Therapy   ?  Manual Therapy Manual Lymphatic Drainage (MLD);Compression Bandaging;Other (comment)   ?  Manual therapy comments completed seperate from all other aspects of treatment   ?  Manual Lymphatic Drainage (MLD) supracalvicular, deep and superfical abdominal inguinal/axillary anastomosis for Bil LE anteriorly and posteriorly in sidelying  ?  Compression Bandaging to distal bil LE's 1/2" foam and mulilayer short stretch bandaging.  ?     ? ?   Right Lower Extremity Lymphedema  Below measurements in centimeters ? Eval 01/07/21  03/12/21 03/19/21 04/06/21  ?20 cm Proximal to Suprapatella 68.5 66  65.5 67.5  ?10 cm Proximal to Suprapatella 68.5 67  65 66.5  ?At Midpatella/Popliteal Crease  54.5 57.6 56.4 54  ?30 cm Proximal to Floor at Lateral Plantar Foot 60.2 51.2 54 54.5  ?20 cm Proximal to Floor at Lateral Plantar Foot 46.8 41.7 48 48  ?10 cm Proximal to Floor at Lateral Malleoli 33.5 27.9 29.5 30.5  ?Circumference of ankle/heel 37 34.4 35 36.5  ?5 cm Proximal to 1st MTP Joint 27.5 25.1 26.4 26.5  ?Across MTP Joint 25.5 24.7 24.8 25  ?      ?Left Lower  Extremity Lymphedema     ?20 cm Proximal to Suprapatella 68.2 63  64 65  ?10 cm Proximal to Suprapatella 67.3 66.7 64 67  ?At [place] 48.2 50 49  ?30 cm Proximal to Floor at Late

## 2021-04-09 NOTE — Progress Notes (Signed)
?  04/09/21 0001  ?Manual Therapy  ?Manual Therapy Manual Lymphatic Drainage (MLD);Compression Bandaging;Other (comment)  ?Manual therapy comments completed seperate from all other aspects of treatment  ?Manual Lymphatic Drainage (MLD) supracalvicular, deep and superfical abdominal inguinal/axillary anastomosis f/b B LE; posterior not  completed due to pt being late.  ?Compression Bandaging to distal bil LE's using profore system 1/2in foam  ? ? ?

## 2021-04-13 ENCOUNTER — Ambulatory Visit (HOSPITAL_COMMUNITY): Payer: Medicaid Other | Admitting: Physical Therapy

## 2021-04-15 ENCOUNTER — Ambulatory Visit (HOSPITAL_COMMUNITY): Payer: Medicaid Other

## 2021-04-15 ENCOUNTER — Encounter (HOSPITAL_COMMUNITY): Payer: Self-pay

## 2021-04-15 DIAGNOSIS — I890 Lymphedema, not elsewhere classified: Secondary | ICD-10-CM

## 2021-04-15 NOTE — Therapy (Addendum)
?OUTPATIENT PHYSICAL THERAPY Lymphedema TREATMENT NOTE ? ? ?Patient Name: [name] ?MRN: [id number] ?DOB:1960-12-06, 61 y.o., female ?Today's Date: 04/15/2021 ? ?PCP: Christiana Fuchs, DO ?REFERRING PROVIDER: Aldine Contes, MD ? ? PT End of Session - 04/15/21 1223   ? ? Visit Number 18   ? Number of Visits 20   ? Date for PT Re-Evaluation 05/24/21   ? Authorization Type Healthy blue   ? Authorization Time Period 15 visits approved 1/23-->02/27/21; re-eval + 12 visits from 2/27-->03/27/21; 6 visits approved 3/27-4/21   ? Authorization - Visit Number 4   ? Authorization - Number of Visits 6   ? Progress Note Due on Visit 20   ? PT Start Time 1050   ? PT Stop Time 1210   ? PT Time Calculation (min) 80 min   ? Activity Tolerance Patient tolerated treatment well   ? Behavior During Therapy [facility] for tasks assessed/performed   ? ?  ?  ? ?  ? ? ?Past Medical History:  ?Diagnosis Date  ? Acute on chronic diastolic (congestive) heart failure ([place]) 06/25/2013  ? Acute on chronic respiratory failure ([place]) 06/15/2018  ? Anemia, iron deficiency   ? secondary to menhorrhagia, on oral iron, also b12 def, getting monthly b12 shots  ? Cellulitis 05/2019  ? RIGHT LOWER EXTREMITY  ? CHF (congestive heart failure) ([place])   ? Chronic cough   ? secondary to alleriges and post nasal drip  ? Cognitive impairment   ? COPD (chronic obstructive pulmonary disease) ([place])   ? Cor pulmonale (HCC)   ? PA Peak pressure 15mHg  ? Depression   ? Diabetes mellitus   ? well controlled on metformin  ? Diastolic heart failure ([place]   ? GERD (gastroesophageal reflux disease)   ? H/O mental retardation   ? Herpes   ? Hyperlipidemia   ? Hypertension   ? OSA (obstructive sleep apnea)   ? CPAP  ? Pulmonary hypertension ([place]   ? Renal disorder   ? Restrictive lung disease   ? PFTs 06/2012 (FVC 54% predicted and FEV1 68% predicted w minimal bronchodilator response).  ? Shortness of breath   ? Venous stasis ulcer ([place]   ? chornic, ?followed up at wound care  center, multiple courses of antibiotics in past for cellulitis, on lasix  ? ?Past Surgical History:  ?Procedure Laterality Date  ? CARDIAC CATHETERIZATION N/A 01/13/2016  ? Procedure: Right Heart Cath;  Surgeon: DJolaine Artist MD;  [facility];  Service: Cardiovascular;  Laterality: N/A;  ? CHOLECYSTECTOMY    ? COLONOSCOPY WITH PROPOFOL N/A 12/20/2019  ? Procedure: COLONOSCOPY WITH PROPOFOL;  Surgeon: JMilus Banister MD;  Location: [facility] ENDOS[facility];  Service: Endoscopy;  Laterality: N/A;  ? IR FLUORO GUIDE CV LINE LEFT  11/16/2019  ? IR FLUORO GUIDE CV LINE LEFT  06/17/2020  ? IR FLUORO GUIDE CV LINE LEFT  07/04/2020  ? IR FLUORO GUIDE CV LINE RIGHT  05/09/2019  ? IR FLUORO GUIDE CV LINE RIGHT  08/12/2019  ? IR REMOVAL TUN CV CATH W/O FL  05/05/2019  ? IR [place] VASC ACCESS LEFT  11/16/2019  ? IR [place] VASC ACCESS LEFT  06/17/2020  ? IR [place] VASC ACCESS LEFT  07/04/2020  ? IR [place] VASC ACCESS RIGHT  05/09/2019  ? IR [place] VASC ACCESS RIGHT  08/12/2019  ? IR [place] VASC ACCESS RIGHT  11/16/2019  ? IR VENO/JUGULAR RIGHT  11/16/2019  ? LEFT  AND RIGHT HEART CATHETERIZATION WITH CORONARY ANGIOGRAM N/A 02/28/2014  ? Procedure: LEFT AND RIGHT HEART CATHETERIZATION WITH CORONARY ANGIOGRAM;  Surgeon: Jolaine Artist, MD;  Location: [facility] CATH LAB;  Service: Cardiovascular;  Laterality: N/A;  ? RIGHT HEART CATH N/A 06/27/2018  ? Procedure: RIGHT HEART CATH;  Surgeon: Jolaine Artist, MD;  Location: [place] CV LAB;  Service: Cardiovascular;  Laterality: N/A;  ? RIGHT/LEFT HEART CATH AND CORONARY ANGIOGRAPHY N/A 05/14/2019  ? Procedure: RIGHT/LEFT HEART CATH AND CORONARY ANGIOGRAPHY;  Surgeon: Jolaine Artist, MD;  Location: [place] CV LAB;  Service: Cardiovascular;  Laterality: N/A;  ? ?Patient Active Problem List  ? Diagnosis Date Noted  ? Vomiting 03/26/2021  ? Knee osteoarthritis 12/12/2020  ? Vision changes 09/02/2020  ? Depression 09/02/2020  ? Swelling of lower extremity 08/13/2020  ?  Left paraspinal back pain 05/05/2020  ? Hearing difficulty of both ears 05/05/2020  ? Internal hemorrhoid 01/11/2020  ? External hemorrhoid 01/11/2020  ? Fibroid, uterine 11/07/2019  ? Vagina bleeding 10/15/2019  ? 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 Bleeding from the genitourinary system 09/13/2019  ? Gross hematuria 09/13/2019  ? Chronic venous stasis 05/07/2019  ? Urinary incontinence 12/12/2018  ? 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 Right ovarian cyst 05/19/2017  ? Peripheral neuropathy ([place]) 09/27/2014  ? Healthcare maintenance 09/27/2014  ? Atrial flutter ([place]) 08/17/2012  ? Right heart failure, NYHA class 3 ([place]) 03/15/2012  ? Allergic rhinitis 11/16/2011  ? Diabetes ([place]) 10/15/2010  ? Hyperlipidemia 10/15/2010  ? Obesity 10/14/2010  ? Obstructive sleep apnea 02/21/2009  ? GERD 11/29/2005  ? ? ?REFERRING DIAG: Bilateral LE lymphedema ? ?THERAPY DIAG: Lymphedema, not elsewhere classified ? ?PERTINENT HISTORY: Htn, CHF, COPD, OA, RT LE cellulitis, obesity ? ?PRECAUTIONS: cellulitis ? ?SUBJECTIVE: Pt stated her allergies are bothering her some today.  Stated she could tolerate 1 thigh, doesn't feel she can walk with both on. ?PAIN:  ?Are you having pain? No ? ? ? ? ?TODAY'S TREATMENT:  ?04/14/21: ? ? 04/15/21 0001  ?Manual Therapy  ?Manual Therapy Manual Lymphatic Drainage (MLD);Compression Bandaging;Other (comment)  ?Manual therapy comments completed seperate from all other aspects of treatment  ?Manual Lymphatic Drainage (MLD) supracalvicular, deep and superfical abdominal inguinal/axillary anastomosis f/b B LE; posterior not  completed due to time.  ?Compression Bandaging Multilayer short stretch with 1/2in foam Bil thigh  ?Other Manual Therapy Measurements  ? ?04/09/21: ? ? 04/09/21 0001  ?Manual Therapy  ?Manual Therapy Manual Lymphatic Drainage (MLD);Compression Bandaging;Other (comment)  ?Manual therapy comments completed seperate from all other aspects of treatment   ?Manual Lymphatic Drainage (MLD) supracalvicular, deep and superfical abdominal inguinal/axillary anastomosis f/b B LE; posterior not  completed due to pt being late.  ?Compression Bandaging to distal bil LE's 1/2" foam and mulilayer short stretch bandaging with addition of Lt thigh using 2 12cm short stretch rolls (double length equaling 4 regular rolls)  ? ?04/08/2021 ? ?Manual Therapy   ?  Manual Therapy Compression Bandaging;Other (comment)   ?  Manual therapy comments completed seperate from all other aspects of treatment   ?  Manual Lymphatic Drainage (MLD) supracalvicular, deep and superfical abdominal inguinal/axillary anastomosis for Bil LE anteriorly only today  ?  Compression Bandaging to distal bil LE's 1/2" foam and mulilayer short stretch bandaging with addition of Lt thigh using 2 12cm short stretch rolls (double length equaling 4 regular rolls)  ? ? ?03/19/2021 ? ?Manual Therapy   ?  Manual Therapy Compression  Bandaging;Other (comment)   ?  Manual therapy comments completed seperate from all other aspects of treatment   ?  Manual Lymphatic Drainage (MLD) supracalvicular, deep and superfical abdominal inguinal/axillary anastomosis for Bil LE anteriorly only today  ?  Compression Bandaging to distal bil LE's 1/2" foam and mulilayer short stretch bandaging.  ?  other Measurements  ? ? ? ?03/17/2021 ? ?Manual Therapy   ?  Manual Therapy Manual Lymphatic Drainage (MLD);Compression Bandaging;Other (comment)   ?  Manual therapy comments completed seperate from all other aspects of treatment   ?  Manual Lymphatic Drainage (MLD) supracalvicular, deep and superfical abdominal inguinal/axillary anastomosis for Bil LE anteriorly and posteriorly in sidelying  ?  Compression Bandaging to distal bil LE's 1/2" foam and mulilayer short stretch bandaging.  ?     ? ?   Right Lower Extremity Lymphedema  Below measurements in centimeters ? Eval 01/07/21  03/12/21 03/19/21 04/06/21 04/15/21  ?20 cm Proximal to Suprapatella 68.5 66   65.5 67.5 65.8  ?10 cm Proximal to Suprapatella 68.5 67  65 66.5 65.8  ?At Midpatella/Popliteal Crease  54.5 57.6 56.4 54 55.3  ?30 cm Proximal to Floor at Lateral Plantar Foot 60.2 51.2 54 54.5 55.8  ?20 c

## 2021-04-17 ENCOUNTER — Encounter (HOSPITAL_COMMUNITY): Payer: Medicaid Other | Admitting: Physical Therapy

## 2021-04-22 ENCOUNTER — Encounter (HOSPITAL_COMMUNITY): Payer: Medicaid Other | Admitting: Physical Therapy

## 2021-04-22 ENCOUNTER — Other Ambulatory Visit: Payer: Self-pay | Admitting: *Deleted

## 2021-04-22 NOTE — Patient Instructions (Signed)
Visit Information ? ?Ms. Sirianni was given information about Medicaid Managed Care team care coordination services as a part of their Healthy [facility] Medicaid benefit. [name] verbally consented to engagement with the [facility] Managed Care team.  ? ?If you are experiencing a medical emergency, please call 911 or report to your local emergency department or urgent care.  ? ?If you have a non-emergency medical problem during routine business hours, please contact your provider's office and ask to speak with a nurse.  ? ?For questions related to your Healthy [facility] health plan, please call: [phone] or visit the homepage here: [email or web address] ? ?If you would like to schedule transportation through your Healthy [facility] plan, please call the following number at least 2 days in advance of your appointment: [phone] ? For information about your ride after you set it up, call Ride Assist at [phone]. Use this number to activate a Will Call pickup, or if your transportation is late for a scheduled pickup. Use this number, too, if you need to make a change or cancel a previously scheduled reservation. ? If you need transportation services right away, call [phone]. The after-hours call center is staffed 24 hours to handle ride assistance and urgent reservation requests (including discharges) 365 days a year. Urgent trips include sick visits, hospital discharge requests and life-sustaining treatment. ? ?Call the [place] at [phone], at any time, 24 hours a day, 7 days a week. If you are in danger or need immediate medical attention call 911. ? ?If you would like help to quit smoking, call 1-800-QUIT-NOW 209-222-9131) OR Espa?ol: 1-855-D?jelo-Ya 386-039-5654) o para m?s informaci?n haga clic aqu? or Text READY to 200-400 to register via text ? ?[name], ? ? ?Please see education materials related to exercise provided as  print materials.  ? ?The patient verbalized understanding of instructions, educational materials, and care plan provided today and agreed to receive a mailed copy of patient instructions, educational materials, and care plan.  ? ?Telephone follow up appointment with Managed Medicaid care management team member scheduled for:05/26/21 _0  ? ?[name] RN, BSN ?[place] ?RN Care Coordinator ? ? ?Following is a copy of your plan of care:  ?Care Plan : [place] of Care  ?Updates made by Melissa Montane, RN since 04/22/2021 12:00 AM  ?  ? ?Problem: Chronic Disease Management needs in patient with CHF, COPD and Pulmonary HTN   ?Priority: High  ?  ? ?Long-Range Goal: Development of Plan of Care for Chronic Disease Management needs in patient with CHF, COPD and Pulmonary HTN   ?Start Date: 11/13/2020  ?Expected End Date: 06/03/2021  ?Priority: High  ?Note:   ?Current Barriers:  ?Chronic Disease Management support and education needs related to CHF, COPD, and Pulmonary Disease ?Ms. Storts was not feeling well today, reports increased mucous has her feeling nauseous. She had to reschedule PT today. She is having difficulty filling Remodulin cartridges and would like to assistance or begin receiving premixed cartridges. She needs to call and schedule follow up with PCP, Dr. Haroldine Laws, Dental visit and Eye exam.   ? ?RNCM Clinical Goal(s):  ?Patient will verbalize basic understanding of CHF, COPD, and Pulmonary Disease disease process and self health management plan as evidenced by attending all scheduled appointments, taking medications as prescribed and contacting PCP with any questions or concerns ?take all medications exactly as prescribed and will call provider for medication related questions as evidenced by refilling medications  prior to running out    ?attend all scheduled medical appointments: Multiple PT visits, Needs to schedule with PCP, Dr. Haroldine Laws, Dental visit and Eye  Exam as evidenced by physician notes in EMR        ?work with pharmacist to address medication management related to CHF, COPD, and Pulmonary Disease as evidenced by review of EMR and patient or pharmacist report    ?work with community resource care guide to address needs related to Social Isolation as evidenced by patient and/or community resource care guide support    through collaboration with Consulting civil engineer, provider, and care team.  ? ?Interventions: ?Inter-disciplinary care team collaboration (see longitudinal plan of care) ?Evaluation of current treatment plan related to  self management and patient's adherence to plan as established by provider ?Provided patient with contact information for [facility] [phone] ?Collaborate with MM Pharmacist re: premixed Remodulin cartridges ? ? ?Heart Failure Interventions:  (Status: Goal on Track (progressing): YES.)  Long Term Goal  ?Provided education on low sodium diet ?Discussed the importance of keeping all appointments with provider ?Provided patient with education about the role of exercise in the management of heart failure ?Assessed social determinant of health barriers ?Discussed upcoming appointments with Lymphedema Clinic and scheduling follow up with Dr. Haroldine Laws ?Discussed fluid restrictions, advised patient to limit all fluids to 2 L per day. ? ? ?COPD: (Status: Goal on Track (progressing): YES.) Long Term Goal  ?Reviewed medications with patient, including use of prescribed maintenance and rescue inhalers, and provided instruction on medication management and the importance of adherence ?Advised patient to self assesses COPD action plan zone and make appointment with provider if in the yellow zone for 48 hours without improvement ?Advised patient to engage in light exercise as tolerated 3-5 days a week to aid in the the management of COPD ?Discussed the importance of adequate rest and management of fatigue with COPD ?Assessed social  determinant of health barriers ?Advised to perform breathing exercises that have been provided/instructed by pulmonology  ?Advised to attend Pulmonology visit on 06/16/21 ? ?Patient Goals/Self-Care Activities: ?call office if I gain more than 2 pounds in one day or 5 pounds in one week ?keep legs up while sitting ?track weight in diary ?use salt in moderation ?weigh myself daily ?eat more whole grains, fruits and vegetables, lean meats and healthy fats ?- avoid second hand smoke ?- limit outdoor activity during cold weather ? ? ?  ? ?  ?

## 2021-04-22 NOTE — Patient Outreach (Signed)
?Medicaid Managed Care   ?Nurse Care Manager Note ? ?04/22/2021 ?Name:  [name] MRN:  [id number] DOB:  1960/03/04 ? ?[name] is an 61 y.o. year old female who is a primary patient of Masters, [name], DO.  The [facility] Managed Care Coordination team was consulted for assistance with:    ?CHF ?COPD ? ?[name] was given information about Medicaid Managed Care Coordination team services today. [name] Patient agreed to services and verbal consent obtained. ? ?Engaged with patient by telephone for follow up visit in response to provider referral for case management and/or care coordination services.  ? ?Assessments/Interventions:  Review of past medical history, allergies, medications, health status, including review of consultants reports, laboratory and other test data, was performed as part of comprehensive evaluation and provision of chronic care management services. ? ?SDOH (Social Determinants of Health) assessments and interventions performed: ?SDOH Interventions   ? ?Flowsheet Row Most Recent Value  ?SDOH Interventions   ?Physical Activity Interventions Intervention Not Indicated  ? ?  ? ? ?Care Plan ? ?Allergies  ?Allergen Reactions  ? Aspirin Swelling  ?  REACTION: airway swelling  ? Codeine Other (See Comments)  ?  REACTION: tingling in lips and hard breathing - had reaction at dentist - states "I can't take certain kinds of codeine" - happened maybe 10 yr ago  ? Lisinopril Cough  ? Sulfonamide Derivatives Swelling  ?  REACTION: airway swelling  ? Latex Rash  ? ? ?Medications Reviewed Today   ? ? RNCM unable to review medications today due to Epic connectivity issues ? ?  ?  ? ?  ? ? ?Patient Active Problem List  ? Diagnosis Date Noted  ? Vomiting 03/26/2021  ? Knee osteoarthritis 12/12/2020  ? Vision changes 09/02/2020  ? Depression 09/02/2020  ? Swelling of lower extremity 08/13/2020  ? Left paraspinal back pain 05/05/2020  ? Hearing difficulty of both ears 05/05/2020  ? Internal  hemorrhoid 01/11/2020  ? External hemorrhoid 01/11/2020  ? Fibroid, uterine 11/07/2019  ? Vagina bleeding 10/15/2019  ? 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 Bleeding from the genitourinary system 09/13/2019  ? Gross hematuria 09/13/2019  ? Chronic venous stasis 05/07/2019  ? Urinary incontinence 12/12/2018  ? 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 Right ovarian cyst 05/19/2017  ? Peripheral neuropathy ([place]) 09/27/2014  ? Healthcare maintenance 09/27/2014  ? Atrial flutter ([place]) 08/17/2012  ? Right heart failure, NYHA class 3 ([place]) 03/15/2012  ? Allergic rhinitis 11/16/2011  ? Diabetes ([place]) 10/15/2010  ? Hyperlipidemia 10/15/2010  ? Obesity 10/14/2010  ? Obstructive sleep apnea 02/21/2009  ? GERD 11/29/2005  ? ? ?Conditions to be addressed/monitored per PCP order:  CHF and COPD ? ?Care Plan : RN Care Manager Plan of Care  ?Updates made by Melissa Montane, RN since 04/22/2021 12:00 AM  ?  ? ?Problem: Chronic Disease Management needs in patient with CHF, COPD and Pulmonary HTN   ?Priority: High  ?  ? ?Long-Range Goal: Development of Plan of Care for Chronic Disease Management needs in patient with CHF, COPD and Pulmonary HTN   ?Start Date: 11/13/2020  ?Expected End Date: 06/03/2021  ?Priority: High  ?Note:   ?Current Barriers:  ?Chronic Disease Management support and education needs related to CHF, COPD, and Pulmonary Disease ?[name] was not feeling well today, reports increased mucous has her feeling nauseous. She had to reschedule PT today. She is having difficulty filling Remodulin cartridges and would like  to assistance or begin receiving premixed cartridges. She needs to call and schedule follow up with PCP, Dr. Haroldine Laws, Dental visit and Eye exam.   ? ?RNCM Clinical Goal(s):  ?Patient will verbalize basic understanding of CHF, COPD, and Pulmonary Disease disease process and self health management plan as evidenced by attending all scheduled appointments, taking medications  as prescribed and contacting PCP with any questions or concerns ?take all medications exactly as prescribed and will call provider for medication related questions as evidenced by refilling medications prior to running out    ?attend all scheduled medical appointments: Multiple PT visits, Needs to schedule with PCP, Dr. Haroldine Laws, Dental visit and Eye Exam as evidenced by physician notes in EMR        ?work with pharmacist to address medication management related to CHF, COPD, and Pulmonary Disease as evidenced by review of EMR and patient or pharmacist report    ?work with community resource care guide to address needs related to Social Isolation as evidenced by patient and/or community resource care guide support    through collaboration with Consulting civil engineer, provider, and care team.  ? ?Interventions: ?Inter-disciplinary care team collaboration (see longitudinal plan of care) ?Evaluation of current treatment plan related to  self management and patient's adherence to plan as established by provider ?Provided patient with contact information for [facility] [phone] ?Collaborate with MM Pharmacist re: premixed Remodulin cartridges ? ? ?Heart Failure Interventions:  (Status: Goal on Track (progressing): YES.)  Long Term Goal  ?Provided education on low sodium diet ?Discussed the importance of keeping all appointments with provider ?Provided patient with education about the role of exercise in the management of heart failure ?Assessed social determinant of health barriers ?Discussed upcoming appointments with Lymphedema Clinic and scheduling follow up with Dr. Haroldine Laws ?Discussed fluid restrictions, advised patient to limit all fluids to 2 L per day. ? ? ?COPD: (Status: Goal on Track (progressing): YES.) Long Term Goal  ?Reviewed medications with patient, including use of prescribed maintenance and rescue inhalers, and provided instruction on medication management and the importance of adherence ?Advised  patient to self assesses COPD action plan zone and make appointment with provider if in the yellow zone for 48 hours without improvement ?Advised patient to engage in light exercise as tolerated 3-5 days a week to aid in the the management of COPD ?Discussed the importance of adequate rest and management of fatigue with COPD ?Assessed social determinant of health barriers ?Advised to perform breathing exercises that have been provided/instructed by pulmonology  ?Advised to attend Pulmonology visit on 06/16/21 ? ?Patient Goals/Self-Care Activities: ?call office if I gain more than 2 pounds in one day or 5 pounds in one week ?keep legs up while sitting ?track weight in diary ?use salt in moderation ?weigh myself daily ?eat more whole grains, fruits and vegetables, lean meats and healthy fats ?- avoid second hand smoke ?- limit outdoor activity during cold weather ? ? ?  ? ? ?Follow Up:  Patient agrees to Care Plan and Follow-up. ? ?Plan: The Managed Medicaid care management team will reach out to the patient again over the next 30 days. ? ?Date/time of next scheduled RN care management/care coordination outreach:  05/26/21 @ 9am ? ?[name] RN, BSN ?[place] ?RN Care Coordinator ? ?

## 2021-04-24 ENCOUNTER — Encounter (HOSPITAL_COMMUNITY): Payer: Self-pay

## 2021-04-24 ENCOUNTER — Ambulatory Visit (HOSPITAL_COMMUNITY): Payer: Medicaid Other

## 2021-04-24 DIAGNOSIS — I890 Lymphedema, not elsewhere classified: Secondary | ICD-10-CM

## 2021-04-24 NOTE — Therapy (Signed)
?OUTPATIENT PHYSICAL THERAPY Lymphedema TREATMENT NOTE ? ? ?Patient Name: STARLETTA HOUCHIN ?MRN: [id number] ?DOB:Jul 04, 1960, 61 y.o., female ?Today's Date: 04/24/2021 ? ?PCP: Christiana Fuchs, DO ?REFERRING PROVIDER: Aldine Contes, MD ? ? PT End of Session - 04/24/21 1437   ? ? Visit Number 19   ? Number of Visits 20   ? Date for PT Re-Evaluation 04/24/21   ? Authorization Type Healthy blue   ? Authorization Time Period 15 visits approved 1/23-->02/27/21; re-eval + 12 visits from 2/27-->03/27/21; 6 visits approved 3/27-4/21; Health blue requested 8 visits on 04/24/21  ? Authorization - Visit Number 5   ? Authorization - Number of Visits 6   ? Progress Note Due on Visit 20   ? PT Start Time 1312   ? PT Stop Time 1435   ? PT Time Calculation (min) 83 min   ? Activity Tolerance Patient tolerated treatment well   ? Behavior During Therapy [facility] for tasks assessed/performed   ? ?  ?  ? ?  ? ? ? ?Past Medical History:  ?Diagnosis Date  ? Acute on chronic diastolic (congestive) heart failure ([place]) 06/25/2013  ? Acute on chronic respiratory failure ([place]) 06/15/2018  ? Anemia, iron deficiency   ? secondary to menhorrhagia, on oral iron, also b12 def, getting monthly b12 shots  ? Cellulitis 05/2019  ? RIGHT LOWER EXTREMITY  ? CHF (congestive heart failure) ([place])   ? Chronic cough   ? secondary to alleriges and post nasal drip  ? Cognitive impairment   ? COPD (chronic obstructive pulmonary disease) ([place])   ? Cor pulmonale (HCC)   ? PA Peak pressure 46mHg  ? Depression   ? Diabetes mellitus   ? well controlled on metformin  ? Diastolic heart failure ([place]   ? GERD (gastroesophageal reflux disease)   ? H/O mental retardation   ? Herpes   ? Hyperlipidemia   ? Hypertension   ? OSA (obstructive sleep apnea)   ? CPAP  ? Pulmonary hypertension ([place]   ? Renal disorder   ? Restrictive lung disease   ? PFTs 06/2012 (FVC 54% predicted and FEV1 68% predicted w minimal bronchodilator response).  ? Shortness of breath   ? Venous stasis ulcer  ([place]   ? chornic, ?followed up at wound care center, multiple courses of antibiotics in past for cellulitis, on lasix  ? ?Past Surgical History:  ?Procedure Laterality Date  ? CARDIAC CATHETERIZATION N/A 01/13/2016  ? Procedure: Right Heart Cath;  Surgeon: DJolaine Artist MD;  [facility];  Service: Cardiovascular;  Laterality: N/A;  ? CHOLECYSTECTOMY    ? COLONOSCOPY WITH PROPOFOL N/A 12/20/2019  ? Procedure: COLONOSCOPY WITH PROPOFOL;  Surgeon: JMilus Banister MD;  [facility];  Service: Endoscopy;  Laterality: N/A;  ? IR FLUORO GUIDE CV LINE LEFT  11/16/2019  ? IR FLUORO GUIDE CV LINE LEFT  06/17/2020  ? IR FLUORO GUIDE CV LINE LEFT  07/04/2020  ? IR FLUORO GUIDE CV LINE RIGHT  05/09/2019  ? IR FLUORO GUIDE CV LINE RIGHT  08/12/2019  ? IR REMOVAL TUN CV CATH W/O FL  05/05/2019  ? IR [place]IDE VASC ACCESS LEFT  11/16/2019  ? IR [place]IDE VASC ACCESS LEFT  06/17/2020  ? IR [place]IDE VASC ACCESS LEFT  07/04/2020  ? IR [place] VASC ACCESS RIGHT  05/09/2019  ? IR [place]IDE VASC ACCESS RIGHT  08/12/2019  ? IR [place]IDE VASC ACCESS RIGHT  11/16/2019  ? IR  VENO/JUGULAR RIGHT  11/16/2019  ? LEFT AND RIGHT HEART CATHETERIZATION WITH CORONARY ANGIOGRAM N/A 02/28/2014  ? Procedure: LEFT AND RIGHT HEART CATHETERIZATION WITH CORONARY ANGIOGRAM;  Surgeon: Jolaine Artist, MD;  Location: [facility] CATH LAB;  Service: Cardiovascular;  Laterality: N/A;  ? RIGHT HEART CATH N/A 06/27/2018  ? Procedure: RIGHT HEART CATH;  Surgeon: Jolaine Artist, MD;  Location: [name] [facility];  Service: Cardiovascular;  Laterality: N/A;  ? RIGHT/LEFT HEART CATH AND CORONARY ANGIOGRAPHY N/A 05/14/2019  ? Procedure: RIGHT/LEFT HEART CATH AND CORONARY ANGIOGRAPHY;  Surgeon: Jolaine Artist, MD;  Location: [place] CV LAB;  Service: Cardiovascular;  Laterality: N/A;  ? ?Patient Active Problem List  ? Diagnosis Date Noted  ? Vomiting 03/26/2021  ? Knee osteoarthritis 12/12/2020  ? Vision changes 09/02/2020  ? Depression 09/02/2020  ?  Swelling of lower extremity 08/13/2020  ? Left paraspinal back pain 05/05/2020  ? Hearing difficulty of both ears 05/05/2020  ? Internal hemorrhoid 01/11/2020  ? External hemorrhoid 01/11/2020  ? Fibroid, uterine 11/07/2019  ? Vagina bleeding 10/15/2019  ? 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 Bleeding from the genitourinary system 09/13/2019  ? Gross hematuria 09/13/2019  ? Chronic venous stasis 05/07/2019  ? Urinary incontinence 12/12/2018  ? 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 Right ovarian cyst 05/19/2017  ? Peripheral neuropathy ([place]) 09/27/2014  ? Healthcare maintenance 09/27/2014  ? Atrial flutter ([place]) 08/17/2012  ? Right heart failure, NYHA class 3 ([place]) 03/15/2012  ? Allergic rhinitis 11/16/2011  ? Diabetes ([place]) 10/15/2010  ? Hyperlipidemia 10/15/2010  ? Obesity 10/14/2010  ? Obstructive sleep apnea 02/21/2009  ? GERD 11/29/2005  ? ? ?REFERRING DIAG: Bilateral LE lymphedema ? ?THERAPY DIAG: Lymphedema, not elsewhere classified ? ?PERTINENT HISTORY: Htn, CHF, COPD, OA, RT LE cellulitis, obesity ? ?PRECAUTIONS: cellulitis ? ?SUBJECTIVE: Pt stated she had difficulty breathing last week, reason she missed last week.   ?PAIN:  ?Are you having pain? No ? ? ? ? ?TODAY'S TREATMENT:  ? ? 04/24/21 0001  ?Manual Therapy  ?Manual Therapy Manual Lymphatic Drainage (MLD);Compression Bandaging;Other (comment)  ?Manual therapy comments completed seperate from all other aspects of treatment  ?Manual Lymphatic Drainage (MLD) supracalvicular, deep and superfical abdominal inguinal/axillary anastomosis f/b B LE; posterior not  completed due to time.  ?Compression Bandaging Multilayer short stretch with 1/2in foam Bil thigh  ?Other Manual Therapy Measurements, demonstrated donning compression garments and juxtafit, discussed which compression best for pt  ? ? ?04/14/21: ? ? 04/15/21 0001  ?Manual Therapy  ?Manual Therapy Manual Lymphatic Drainage (MLD);Compression Bandaging;Other (comment)   ?Manual therapy comments completed seperate from all other aspects of treatment  ?Manual Lymphatic Drainage (MLD) supracalvicular, deep and superfical abdominal inguinal/axillary anastomosis f/b B LE; posterior not  completed due to time.  ?Compression Bandaging Multilayer short stretch with 1/2in foam Bil thigh  ?Other Manual Therapy Measurements  ? ?04/09/21: ? ? 04/09/21 0001  ?Manual Therapy  ?Manual Therapy Manual Lymphatic Drainage (MLD);Compression Bandaging;Other (comment)  ?Manual therapy comments completed seperate from all other aspects of treatment  ?Manual Lymphatic Drainage (MLD) supracalvicular, deep and superfical abdominal inguinal/axillary anastomosis f/b B LE; posterior not  completed due to pt being late.  ?Compression Bandaging to distal bil LE's 1/2" foam and mulilayer short stretch bandaging with addition of Lt thigh using 2 12cm short stretch rolls (double length equaling 4 regular rolls)  ? ?04/08/2021 ? ?Manual Therapy   ?  Manual Therapy Compression Bandaging;Other (comment)   ?  Manual therapy comments completed seperate from all other aspects of treatment   ?  Manual Lymphatic Drainage (MLD) supracalvicular, deep and superfical abdominal inguinal/axillary anastomosis for Bil LE anteriorly only today  ?  Compression Bandaging to distal bil LE's 1/2" foam and mulilayer short stretch bandaging with addition of Lt thigh using 2 12cm short stretch rolls (double length equaling 4 regular rolls)  ? ? ?03/19/2021 ? ?Manual Therapy   ?  Manual Therapy Compression Bandaging;Other (comment)   ?  Manual therapy comments completed seperate from all other aspects of treatment   ?  Manual Lymphatic Drainage (MLD) supracalvicular, deep and superfical abdominal inguinal/axillary anastomosis for Bil LE anteriorly only today  ?  Compression Bandaging to distal bil LE's 1/2" foam and mulilayer short stretch bandaging.  ?  other Measurements  ? ? ? ?03/17/2021 ? ?Manual Therapy   ?  Manual Therapy Manual  Lymphatic Drainage (MLD);Compression Bandaging;Other (comment)   ?  Manual therapy comments completed seperate from all other aspects of treatment   ?  Manual Lymphatic Drainage (MLD) supracalvicular, deep and

## 2021-04-24 NOTE — Addendum Note (Signed)
Addended by: Leeroy Cha on: 04/24/2021 04:27 PM ? ? Modules accepted: Orders ? ?

## 2021-04-28 ENCOUNTER — Encounter (HOSPITAL_COMMUNITY): Payer: Medicaid Other | Admitting: Physical Therapy

## 2021-04-29 ENCOUNTER — Encounter (HOSPITAL_COMMUNITY): Payer: Self-pay

## 2021-04-29 ENCOUNTER — Ambulatory Visit (HOSPITAL_COMMUNITY): Payer: Medicaid Other

## 2021-04-29 DIAGNOSIS — I890 Lymphedema, not elsewhere classified: Secondary | ICD-10-CM | POA: Diagnosis not present

## 2021-04-29 NOTE — Therapy (Addendum)
?OUTPATIENT PHYSICAL THERAPY Lymphedema TREATMENT NOTE ? ? ?Patient Name: [name] ?MRN: [id number] ?DOB:[birth year], 61 y.o., female ?Today's Date: 04/29/2021 ? ?PCP: Christiana Fuchs, DO ?REFERRING PROVIDER: Aldine Contes, MD ? ? PT End of Session - 04/24/21 1437   ? ? Visit Number 20  ? Number of Visits 27  ? Date for PT Re-Evaluation 05/15/21  ? Authorization Type Healthy blue   ? Authorization Time Period 15 visits approved 1/23-->02/27/21; re-eval + 12 visits from 2/27-->03/27/21; 6 visits approved 3/27-4/21; Health blue requested 8 visits on 04/24/21  ? Authorization - Visit Number   ? Authorization - Number of Visits    ? Progress Note Due on Visit 27  ? PT Start Time 1025  ? PT Stop Time 1155  ? PT Time Calculation (min) 90 min   ? Activity Tolerance Patient tolerated treatment well   ? Behavior During Therapy [facility] for tasks assessed/performed   ? ?  ?  ? ?  ? ? ? ?Past Medical History:  ?Diagnosis Date  ? Acute on chronic diastolic (congestive) heart failure ([place]) 06/25/2013  ? Acute on chronic respiratory failure ([place]) 06/15/2018  ? Anemia, iron deficiency   ? secondary to menhorrhagia, on oral iron, also b12 def, getting monthly b12 shots  ? Cellulitis 05/2019  ? RIGHT LOWER EXTREMITY  ? CHF (congestive heart failure) ([place])   ? Chronic cough   ? secondary to alleriges and post nasal drip  ? Cognitive impairment   ? COPD (chronic obstructive pulmonary disease) ([place])   ? Cor pulmonale (HCC)   ? PA Peak pressure 71mHg  ? Depression   ? Diabetes mellitus   ? well controlled on metformin  ? Diastolic heart failure ([place]   ? GERD (gastroesophageal reflux disease)   ? H/O mental retardation   ? Herpes   ? Hyperlipidemia   ? Hypertension   ? OSA (obstructive sleep apnea)   ? CPAP  ? Pulmonary hypertension [place]   ? Renal disorder   ? Restrictive lung disease   ? PFTs 06/2012 (FVC 54% predicted and FEV1 68% predicted w minimal bronchodilator response).  ? Shortness of breath   ? Venous stasis ulcer ([place]   ?  chornic, ?followed up at wound care center, multiple courses of antibiotics in past for cellulitis, on lasix  ? ?Past Surgical History:  ?Procedure Laterality Date  ? CARDIAC CATHETERIZATION N/A 01/13/2016  ? Procedure: Right Heart Cath;  Surgeon: DJolaine Artist MD;  [facility];  Service: Cardiovascular;  Laterality: N/A;  ? CHOLECYSTECTOMY    ? COLONOSCOPY WITH PROPOFOL N/A 12/20/2019  ? Procedure: COLONOSCOPY WITH PROPOFOL;  Surgeon: JMilus Banister MD;  [facility];  Service: Endoscopy;  Laterality: N/A;  ? IR FLUORO GUIDE CV LINE LEFT  11/16/2019  ? IR FLUORO GUIDE CV LINE LEFT  06/17/2020  ? IR FLUORO GUIDE CV LINE LEFT  07/04/2020  ? IR FLUORO GUIDE CV LINE RIGHT  05/09/2019  ? IR FLUORO GUIDE CV LINE RIGHT  08/12/2019  ? IR REMOVAL TUN CV CATH W/O FL  05/05/2019  ? IR [place]IDE VASC ACCESS LEFT  11/16/2019  ? IR [place]IDE VASC ACCESS LEFT  06/17/2020  ? IR [place]IDE VASC ACCESS LEFT  07/04/2020  ? IR [place]IDE VASC ACCESS RIGHT  05/09/2019  ? IR [place]IDE VASC ACCESS RIGHT  08/12/2019  ? IR [place]IDE VASC ACCESS RIGHT  11/16/2019  ? IR VENO/JUGULAR RIGHT  11/16/2019  ? LEFT  AND RIGHT HEART CATHETERIZATION WITH CORONARY ANGIOGRAM N/A 02/28/2014  ? Procedure: LEFT AND RIGHT HEART CATHETERIZATION WITH CORONARY ANGIOGRAM;  Surgeon: Jolaine Artist, MD;  Location: [facility] CATH LAB;  Service: Cardiovascular;  Laterality: N/A;  ? RIGHT HEART CATH N/A 06/27/2018  ? Procedure: RIGHT HEART CATH;  Surgeon: Jolaine Artist, MD;  Location: [place] CV LAB;  Service: Cardiovascular;  Laterality: N/A;  ? RIGHT/LEFT HEART CATH AND CORONARY ANGIOGRAPHY N/A 05/14/2019  ? Procedure: RIGHT/LEFT HEART CATH AND CORONARY ANGIOGRAPHY;  Surgeon: Jolaine Artist, MD;  Location: [place] CV LAB;  Service: Cardiovascular;  Laterality: N/A;  ? ?Patient Active Problem List  ? Diagnosis Date Noted  ? Vomiting 03/26/2021  ? Knee osteoarthritis 12/12/2020  ? Vision changes 09/02/2020  ? Depression 09/02/2020  ? Swelling  of lower extremity 08/13/2020  ? Left paraspinal back pain 05/05/2020  ? Hearing difficulty of both ears 05/05/2020  ? Internal hemorrhoid 01/11/2020  ? External hemorrhoid 01/11/2020  ? Fibroid, uterine 11/07/2019  ? Vagina bleeding 10/15/2019  ? 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 Bleeding from the genitourinary system 09/13/2019  ? Gross hematuria 09/13/2019  ? Chronic venous stasis 05/07/2019  ? Urinary incontinence 12/12/2018  ? 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 Right ovarian cyst 05/19/2017  ? Peripheral neuropathy ([place]) 09/27/2014  ? Healthcare maintenance 09/27/2014  ? Atrial flutter ([place]) 08/17/2012  ? Right heart failure, NYHA class 3 ([place]) 03/15/2012  ? Allergic rhinitis 11/16/2011  ? Diabetes ([place]) 10/15/2010  ? Hyperlipidemia 10/15/2010  ? Obesity 10/14/2010  ? Obstructive sleep apnea 02/21/2009  ? GERD 11/29/2005  ? ? ?REFERRING DIAG: Bilateral LE lymphedema ? ?THERAPY DIAG: Lymphedema, not elsewhere classified ? ?PERTINENT HISTORY: Htn, CHF, COPD, OA, RT LE cellulitis, obesity ? ?PRECAUTIONS: cellulitis ? ?SUBJECTIVE: Pt stated she had to remove upper part of dressings the day following due to urination, has washed bandages over weekend.  Stated she used pump once, forgets to use.  Has been doing some of the manual. ?PAIN:  ?Are you having pain? No ?PAIN:  ?Are you having pain? Yes: NPRS scale: 4/10 ?Pain location: Bil knees ?Pain description: achey ?Aggravating factors: weather ?Relieving factors: rest ? ? ? ? ? ?TODAY'S TREATMENT:  ? ? 04/29/21 0001  ?Manual Therapy  ?Manual Therapy Manual Lymphatic Drainage (MLD);Compression Bandaging;Other (comment)  ?Manual therapy comments completed seperate from all other aspects of treatment  ?Manual Lymphatic Drainage (MLD) supracalvicular, deep and superfical abdominal inguinal/axillary anastomosis f/b B LE; posterior not  completed due to time.  ?Compression Bandaging Multilayer short stretch with 1/2in foam Bil  thigh  ?Other Manual Therapy Measurements, discussed daily lymph care and manual self care  ? ? 04/24/21 0001  ?Manual Therapy  ?Manual Therapy Manual Lymphatic Drainage (MLD);Compression Bandaging;Other (comment)  ?Manual therapy comments completed seperate from all other aspects of treatment  ?Manual Lymphatic Drainage (MLD) supracalvicular, deep and superfical abdominal inguinal/axillary anastomosis f/b B LE; posterior not  completed due to time.  ?Compression Bandaging Multilayer short stretch with 1/2in foam Bil thigh  ?Other Manual Therapy Measurements, demonstrated donning compression garments and juxtafit, discussed which compression best for pt  ? ? ?04/14/21: ? ? 04/15/21 0001  ?Manual Therapy  ?Manual Therapy Manual Lymphatic Drainage (MLD);Compression Bandaging;Other (comment)  ?Manual therapy comments completed seperate from all other aspects of treatment  ?Manual Lymphatic Drainage (MLD) supracalvicular, deep and superfical abdominal inguinal/axillary anastomosis f/b B LE; posterior not  completed due to time.  ?Compression Bandaging Multilayer short stretch  with 1/2in foam Bil thigh  ?Other Manual Therapy Measurements  ? ?04/09/21: ? ? 04/09/21 0001  ?Manual Therapy  ?Manual Therapy Manual Lymphatic Drainage (MLD);Compression Bandaging;Other (comment)  ?Manual therapy comments completed seperate from all other aspects of treatment  ?Manual Lymphatic Drainage (MLD) supracalvicular, deep and superfical abdominal inguinal/axillary anastomosis f/b B LE; posterior not  completed due to pt being late.  ?Compression Bandaging to distal bil LE's 1/2" foam and mulilayer short stretch bandaging with addition of Lt thigh using 2 12cm short stretch rolls (double length equaling 4 regular rolls)  ? ?04/08/2021 ? ?Manual Therapy   ?  Manual Therapy Compression Bandaging;Other (comment)   ?  Manual therapy comments completed seperate from all other aspects of treatment   ?  Manual Lymphatic Drainage (MLD)  supracalvicular, deep and superfical abdominal inguinal/axillary anastomosis for Bil LE anteriorly only today  ?  Compression Bandaging to distal bil LE's 1/2" foam and mulilayer short stretch bandaging with addition of Lt

## 2021-04-30 ENCOUNTER — Other Ambulatory Visit: Payer: Self-pay

## 2021-04-30 DIAGNOSIS — E538 Deficiency of other specified B group vitamins: Secondary | ICD-10-CM

## 2021-04-30 MED ORDER — CYANOCOBALAMIN 500 MCG PO TABS: 500.0000 ug | ORAL_TABLET | Freq: Every day | ORAL | 4 refills | Status: DC

## 2021-05-01 ENCOUNTER — Encounter (HOSPITAL_COMMUNITY): Payer: Medicaid Other

## 2021-05-01 ENCOUNTER — Telehealth (HOSPITAL_COMMUNITY): Payer: Self-pay

## 2021-05-01 NOTE — Telephone Encounter (Signed)
No show, attempted to call with no answer and mailbox full. ? ?[name], LPTA/CLT; CBIS ?[phone] ? ?

## 2021-05-04 ENCOUNTER — Ambulatory Visit (HOSPITAL_COMMUNITY): Payer: Medicaid Other | Attending: Internal Medicine | Admitting: Physical Therapy

## 2021-05-04 DIAGNOSIS — I890 Lymphedema, not elsewhere classified: Secondary | ICD-10-CM | POA: Diagnosis present

## 2021-05-04 NOTE — Therapy (Signed)
OUTPATIENT PHYSICAL THERAPY Lymphedema TREATMENT NOTE   Patient Name: [name] MRN: [id number] DOB:[birth year], 61 y.o., female Today's Date: 05/04/2021  PCP: Rudene Christians, DO REFERRING PROVIDER: Earl Lagos, MD   PT End of Session - 04/24/21 1437     Visit Number 20   Number of Visits 27   Date for PT Re-Evaluation 05/15/21   Authorization Type Healthy blue    Authorization Time Period 15 visits approved 1/23-->02/27/21; re-eval + 12 visits from 2/27-->03/27/21; 6 visits approved 3/27-4/21; Health blue requested 8 visits on 04/24/21   Authorization - Visit Number    Authorization - Number of Visits     Progress Note Due on Visit 27   PT Start Time 1025   PT Stop Time 1155   PT Time Calculation (min) 90 min    Activity Tolerance Patient tolerated treatment well    Behavior During Therapy WFL for tasks assessed/performed              Past Medical History:  Diagnosis Date   Acute on chronic diastolic (congestive) heart failure (HCC) 06/25/2013   Acute on chronic respiratory failure (HCC) 06/15/2018   Anemia, iron deficiency    secondary to menhorrhagia, on oral iron, also b12 def, getting monthly b12 shots   Cellulitis 05/2019   RIGHT LOWER EXTREMITY   CHF (congestive heart failure) (HCC)    Chronic cough    secondary to alleriges and post nasal drip   Cognitive impairment    COPD (chronic obstructive pulmonary disease) (HCC)    Cor pulmonale (HCC)    PA Peak pressure   Depression    Diabetes mellitus    well controlled on metformin   Diastolic heart failure (HCC)    GERD (gastroesophageal reflux disease)    H/O mental retardation    Herpes    Hyperlipidemia    Hypertension    OSA (obstructive sleep apnea)    CPAP   Pulmonary hypertension (HCC)    Renal disorder    Restrictive lung disease    PFTs 06/2012 (FVC 54% predicted and FEV1 68% predicted w minimal bronchodilator response).   Shortness of breath    Venous stasis ulcer (HCC)     chornic, ?followed up at wound care center, multiple courses of antibiotics in past for cellulitis, on lasix   Past Surgical History:  Procedure Laterality Date   CARDIAC CATHETERIZATION N/A 01/13/2016   Procedure: Right Heart Cath;  Surgeon: Dolores Patty, MD;  Location: [facility] INVASIVE CV LAB;  Service: Cardiovascular;  Laterality: N/A;   CHOLECYSTECTOMY     COLONOSCOPY WITH PROPOFOL N/A 12/20/2019   Procedure: COLONOSCOPY WITH PROPOFOL;  Surgeon: Rachael Fee, MD;  Location: [facility] ENDOSCOPY;  Service: Endoscopy;  Laterality: N/A;   IR FLUORO GUIDE CV LINE LEFT  11/16/2019   IR FLUORO GUIDE CV LINE LEFT  06/17/2020   IR FLUORO GUIDE CV LINE LEFT  07/04/2020   IR FLUORO GUIDE CV LINE RIGHT  05/09/2019   IR FLUORO GUIDE CV LINE RIGHT  08/12/2019   IR REMOVAL TUN CV CATH W/O FL  05/05/2019   IR US GUIDE VASC ACCESS LEFT  11/16/2019   IR US GUIDE VASC ACCESS LEFT  06/17/2020   IR US GUIDE VASC ACCESS LEFT  07/04/2020   IR US GUIDE VASC ACCESS RIGHT  05/09/2019   IR US GUIDE VASC ACCESS RIGHT  08/12/2019   IR US GUIDE VASC ACCESS RIGHT  11/16/2019   IR VENO/JUGULAR RIGHT  11/16/2019   LEFT  AND RIGHT HEART CATHETERIZATION WITH CORONARY ANGIOGRAM N/A 02/28/2014   Procedure: LEFT AND RIGHT HEART CATHETERIZATION WITH CORONARY ANGIOGRAM;  Surgeon: Dolores Patty, MD;  Location: [facility] CATH LAB;  Service: Cardiovascular;  Laterality: N/A;   RIGHT HEART CATH N/A 06/27/2018   Procedure: RIGHT HEART CATH;  Surgeon: Dolores Patty, MD;  Location: [facility] INVASIVE CV LAB;  Service: Cardiovascular;  Laterality: N/A;   RIGHT/LEFT HEART CATH AND CORONARY ANGIOGRAPHY N/A 05/14/2019   Procedure: RIGHT/LEFT HEART CATH AND CORONARY ANGIOGRAPHY;  Surgeon: Dolores Patty, MD;  Location: [facility] INVASIVE CV LAB;  Service: Cardiovascular;  Laterality: N/A;   Patient Active Problem List   Diagnosis Date Noted   Vomiting 03/26/2021   Knee osteoarthritis 12/12/2020   Vision changes 09/02/2020   Depression 09/02/2020   Swelling  of lower extremity 08/13/2020   Left paraspinal back pain 05/05/2020   Hearing difficulty of both ears 05/05/2020   Internal hemorrhoid 01/11/2020   External hemorrhoid 01/11/2020   Fibroid, uterine 11/07/2019   Vagina bleeding 10/15/2019   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   Bleeding from the genitourinary system 09/13/2019   Gross hematuria 09/13/2019   Chronic venous stasis 05/07/2019   Urinary incontinence 12/12/2018   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)    Right ovarian cyst 05/19/2017   Peripheral neuropathy (HCC) 09/27/2014   Healthcare maintenance 09/27/2014   Atrial flutter (HCC) 08/17/2012   Right heart failure, NYHA class 3 (HCC) 03/15/2012   Allergic rhinitis 11/16/2011   Diabetes (HCC) 10/15/2010   Hyperlipidemia 10/15/2010   Obesity 10/14/2010   Obstructive sleep apnea 02/21/2009   GERD 11/29/2005    REFERRING DIAG: Bilateral LE lymphedema  THERAPY DIAG: Lymphedema, not elsewhere classified  PERTINENT HISTORY: Htn, CHF, COPD, OA, RT LE cellulitis, obesity  PRECAUTIONS: cellulitis  SUBJECTIVE: Pt stated she was having trouble with her lungs last visit and that's why she did not show.  Reminded to call and cancel appointments.  Pt states she removed her bandages last Friday and has not used her pump or called her doctor regarding the prescription for the juxtafit. States she's thinking about going into a nursing home because her sight is getting so bad. PAIN:  Are you having pain? No PAIN:  Are you having pain? Yes: NPRS scale: 4/10 Pain location: Bil knees Pain description: achey Aggravating factors: weather Relieving factors: rest      TODAY'S TREATMENT:   05/04/21 0001  Manual Therapy  Manual Therapy Manual Lymphatic Drainage (MLD);Compression Bandaging;Other (comment)  Manual therapy comments completed seperate from all other aspects of treatment  Manual Lymphatic Drainage (MLD) supracalvicular, deep and  superfical abdominal inguinal/axillary anastomosis f/b B LE  Compression Bandaging Multilayer short stretch with 1/2in foam Bil thigh  Other Manual Therapy Measurements, discussed daily lymph care and manual self care    04/29/21 0001  Manual Therapy  Manual Therapy Manual Lymphatic Drainage (MLD);Compression Bandaging;Other (comment)  Manual therapy comments completed seperate from all other aspects of treatment  Manual Lymphatic Drainage (MLD) supracalvicular, deep and superfical abdominal inguinal/axillary anastomosis f/b B LE; posterior not  completed due to time.  Compression Bandaging Multilayer short stretch with 1/2in foam Bil thigh  Other Manual Therapy Measurements, discussed daily lymph care and manual self care    04/24/21 0001  Manual Therapy  Manual Therapy Manual Lymphatic Drainage (MLD);Compression Bandaging;Other (comment)  Manual therapy comments completed seperate from all other aspects of treatment  Manual Lymphatic Drainage (MLD) supracalvicular, deep and superfical abdominal inguinal/axillary anastomosis f/b B LE;  posterior not  completed due to time.  Compression Bandaging Multilayer short stretch with 1/2in foam Bil thigh  Other Manual Therapy Measurements, demonstrated donning compression garments and juxtafit, discussed which compression best for pt       Right Lower Extremity Lymphedema  Below measurements in centimeters  Eval 01/07/21  03/12/21 03/19/21 04/06/21 04/15/21 04/24/21 04/29/21  20 cm Proximal to Suprapatella 68.5 66  65.5 67.5 65.8 68.3 68  10 cm Proximal to Suprapatella 68.5 67  65 66.5 65.8 65 62.3  At Midpatella/Popliteal Crease  54.5 57.6 56.4 54 55.3 54.6 55  30 cm Proximal to Floor at Lateral Plantar Foot 60.2 51.2 54 54.5 55.8 55 54  20 cm Proximal to Floor at Lateral Plantar Foot 46.8 41.7 48 48 47.8 45.5 46.7  10 cm Proximal to Floor at Lateral Malleoli 33.5 27.9 29.5 30.5 31.8 30.5 30  Circumference of ankle/heel 37 34.4 35 36.5 36.5 36.9 36  5 cm  Proximal to 1st MTP Joint 27.5 25.1 26.4 26.5 2638 27.5 26.4  Across MTP Joint 25.5 24.7 24.8 25 25.5 26 25.3           Left Lower Extremity Lymphedema        20 cm Proximal to Suprapatella 68.2 63  64 65 64.4 68 65.3  10 cm Proximal to Suprapatella 67.3 66.7 64 67 67 63 65.6  At Midpatella/Popliteal Crease  51 48.2 50 49 51 47 48  30 cm Proximal to Floor at Lateral Plantar Foot 62.7 50.3 53.5 56 55 57.5 54  20 cm Proximal to Floor at Lateral Plantar Foot 44.2 41.3 46.4 48.8 4935 47.6 46  10 cm Proximal to Floor at Lateral Malleoli 34 27  29.8 32 32 31.5 30  Circumference of ankle/heel 36.6 33.3 35.5 36.4 36 37 34.9  5 cm Proximal to 1st MTP Joint 24.7 24.7 24.5 25.5 26.5 27.4 26.4  Across MTP Joint  26.6 24.1 24.6 25 24.8 26.5 25  weight       258.4    PATIENT EDUCATION: Education details: Reviewed importance of compression garments daily, use of pump daily when short stretch not on, importance of compliance with therapy for maximal benefits and HEP. Education method: Explanation Education comprehension: verbalized understanding   HOME EXERCISE PROGRAM: Eval: sitting: ankle pumps, LAQ, hip ab/adduction, marching,diaphragmic breathing and lymph squeeze        PT Short Term Goals - 03/12/21 1043       PT SHORT TERM GOAL #1   Title Pt to be I in HEP to assist in increasing the lymphatic circulation    Baseline 04/29/21:  Reports occasional compliance when she remembers.  04/15/21:  Partial met, has began HEP though not regular basis.  3/9: reviewed importance of HEP compliance    Status On-going      PT SHORT TERM GOAL #2   Title PT to be completing good skin care on a daily basis    Baseline 04/15/21: Pt aware of importance of skin integrity; 3/9:  Reports understanding of skin care, stated she plans to purchase lotion later today.    Status Achieved     PT SHORT TERM GOAL #3   Title PT to have lost 2-3 cm of fluid from B LE to reduce risk of cellulitis    Baseline 04/28/21:  partly met.  04/15/21: partially met.  Pt with decreasead attendance due to sickness with increased volume 03/12/21:  Distal extremities reduced, increased volume Rt thigh.    Status Partially Met  PT Long Term Goals - 03/12/21 1046       PT LONG TERM GOAL #1   Title Pt to have lost 5-6  cm from B LE to reduce risk of cellulitis and to be able to fit into shoes and pants    Status Partially Met      PT LONG TERM GOAL #2   Title PT to have and be using a compression pump    Baseline 04/29/21:  Stated she has used pump 3 times, stated she forgets to use pump 3/3 has pump has not been able to use it due to compression bandages      PT LONG TERM GOAL #3   Title PT to have and to be able to don juxtafit    Baseline 04/29/21:  Refaxed referral for juxtafit, requested pt to call MD to speed up signed referral.  3/3 sent order today    Status Not Met              Plan - 04/15/21 1223     Clinical Impression Statement Discussed compliance/importance of keeping all 3 visits for best outcomes. Pt still has not contacted MD Regarding getting juxta order signed so reminded to do so.  Noted increased redness/heat in distal LE's.  LE's photographed to document this (see media)  Manual lymphedema decongestive techniques complete anterior only today.  Moisturized LE's well prior to rebandaging.   Personal Factors and Comorbidities Comorbidity 3+;Fitness;Time since onset of injury/illness/exacerbation    Comorbidities HTN, obesity, CHF, COPD, OA, Hx of cellulitis    Examination-Activity Limitations Dressing;Hygiene/Grooming;Locomotion Level;Squat;Stairs    Examination-Participation Restrictions Cleaning;Community Activity;Meal Prep    Stability/Clinical Decision Making Evolving/Moderate complexity    Clinical Decision Making Moderate    Rehab Potential Good    PT Frequency 3x / week    PT Duration 6 weeks    PT Treatment/Interventions Therapeutic exercise;Manual lymph  drainage;Compression bandaging    PT Next Visit Plan Continue manual and thigh high short stretch.  Give pt signed referral and contact information for [facility] to get Juxtafit measured prior DC.  Measure on thursday.          Lurena Nida, PTA/CLT, Margarita Rana [phone] 05/04/2021, 3:38 PM

## 2021-05-06 ENCOUNTER — Ambulatory Visit (HOSPITAL_COMMUNITY): Payer: Medicaid Other | Admitting: Physical Therapy

## 2021-05-06 ENCOUNTER — Encounter (HOSPITAL_COMMUNITY): Payer: Self-pay | Admitting: Physical Therapy

## 2021-05-06 DIAGNOSIS — I890 Lymphedema, not elsewhere classified: Secondary | ICD-10-CM

## 2021-05-06 NOTE — Therapy (Addendum)
?OUTPATIENT PHYSICAL THERAPY Lymphedema TREATMENT NOTE ? ? ?Patient Name: [name] ?MRN: [id number] ?DOB:1960-11-25, 61 y.o., female ?Today's Date: 05/06/2021 ? ?PCP: Christiana Fuchs, DO ?REFERRING PROVIDER: Aldine Contes, MD ? ? PT End of Session - 04/24/21 1437   ? ? Visit Number 21  ? Number of Visits 27  ? Date for PT Re-Evaluation 05/15/21  ? Authorization Type Healthy blue   ? Authorization Time Period Health blue requested 8 visits on 04/24/21  three approved ; as of 5/3 21 visits have been used.  ? Authorization - Visit Number 3  ? Authorization - Number of Visits  3  ? Progress Note Due on Visit 27  ? PT Start Time 1025  ? PT Stop Time 1155  ? PT Time Calculation (min) 90 min   ? Activity Tolerance Patient tolerated treatment well   ? Behavior During Therapy [facility] for tasks assessed/performed   ? ?  ?  ? ?  ? ? ? ?Past Medical History:  ?Diagnosis Date  ? Acute on chronic diastolic (congestive) heart failure ([place]) 06/25/2013  ? Acute on chronic respiratory failure ([place]) 06/15/2018  ? Anemia, iron deficiency   ? secondary to menhorrhagia, on oral iron, also b12 def, getting monthly b12 shots  ? Cellulitis 05/2019  ? RIGHT LOWER EXTREMITY  ? CHF (congestive heart failure) ([place])   ? Chronic cough   ? secondary to alleriges and post nasal drip  ? Cognitive impairment   ? COPD (chronic obstructive pulmonary disease) ([place])   ? Cor pulmonale (HCC)   ? PA Peak pressure 38mHg  ? Depression   ? Diabetes mellitus   ? well controlled on metformin  ? Diastolic heart failure ([place]   ? GERD (gastroesophageal reflux disease)   ? H/O mental retardation   ? Herpes   ? Hyperlipidemia   ? Hypertension   ? OSA (obstructive sleep apnea)   ? CPAP  ? [place]monary hypertension [place]   ? Renal disorder   ? Restrictive lung disease   ? PFTs 06/2012 (FVC 54% predicted and FEV1 68% predicted w minimal bronchodilator response).  ? Shortness of breath   ? Venous stasis ulcer ([place]   ? chornic, ?followed up at wound care center, multiple  courses of antibiotics in past for cellulitis, on lasix  ? ?Past Surgical History:  ?Procedure Laterality Date  ? CARDIAC CATHETERIZATION N/A 01/13/2016  ? Procedure: Right Heart Cath;  Surgeon: DJolaine Artist MD;  [facility];  Service: Cardiovascular;  Laterality: N/A;  ? CHOLECYSTECTOMY    ? COLONOSCOPY WITH PROPOFOL N/A 12/20/2019  ? Procedure: COLONOSCOPY WITH PROPOFOL;  Surgeon: JMilus Banister MD;  Location: [facility] ENDOSCOPY;  Service: Endoscopy;  Laterality: N/A;  ? IR FLUORO GUIDE CV LINE LEFT  11/16/2019  ? IR FLUORO GUIDE CV LINE LEFT  06/17/2020  ? IR FLUORO GUIDE CV LINE LEFT  07/04/2020  ? IR FLUORO GUIDE CV LINE RIGHT  05/09/2019  ? IR FLUORO GUIDE CV LINE RIGHT  08/12/2019  ? IR REMOVAL TUN CV CATH W/O FL  05/05/2019  ? IR [place]ASC ACCESS LEFT  11/16/2019  ? IR [place] VASC ACCESS LEFT  06/17/2020  ? IR [place] VASC ACCESS LEFT  07/04/2020  ? IR [place] VASC ACCESS RIGHT  05/09/2019  ? IR [place] VASC ACCESS RIGHT  08/12/2019  ? IR [place] VASC ACCESS RIGHT  11/16/2019  ? IR VENO/JUGULAR RIGHT  11/16/2019  ? LEFT AND RIGHT  HEART CATHETERIZATION WITH CORONARY ANGIOGRAM N/A 02/28/2014  ? Procedure: LEFT AND RIGHT HEART CATHETERIZATION WITH CORONARY ANGIOGRAM;  Surgeon: Jolaine Artist, MD;  Location: [facility] CATH LAB;  Service: Cardiovascular;  Laterality: N/A;  ? RIGHT HEART CATH N/A 06/27/2018  ? Procedure: RIGHT HEART CATH;  Surgeon: Jolaine Artist, MD;  Location: [place] CV LAB;  Service: Cardiovascular;  Laterality: N/A;  ? RIGHT/LEFT HEART CATH AND CORONARY ANGIOGRAPHY N/A 05/14/2019  ? Procedure: RIGHT/LEFT HEART CATH AND CORONARY ANGIOGRAPHY;  Surgeon: Jolaine Artist, MD;  Location: [place] CV LAB;  Service: Cardiovascular;  Laterality: N/A;  ? ?Patient Active Problem List  ? Diagnosis Date Noted  ? Vomiting 03/26/2021  ? Knee osteoarthritis 12/12/2020  ? Vision changes 09/02/2020  ? Depression 09/02/2020  ? Swelling of lower extremity 08/13/2020  ? Left paraspinal back  pain 05/05/2020  ? Hearing difficulty of both ears 05/05/2020  ? Internal hemorrhoid 01/11/2020  ? External hemorrhoid 01/11/2020  ? Fibroid, uterine 11/07/2019  ? Vagina bleeding 10/15/2019  ? 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 Bleeding from the genitourinary system 09/13/2019  ? Gross hematuria 09/13/2019  ? Chronic venous stasis 05/07/2019  ? Urinary incontinence 12/12/2018  ? 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 Right ovarian cyst 05/19/2017  ? Peripheral neuropathy ([place]) 09/27/2014  ? Healthcare maintenance 09/27/2014  ? Atrial flutter ([place]) 08/17/2012  ? Right heart failure, NYHA class 3 ([place]) 03/15/2012  ? Allergic rhinitis 11/16/2011  ? Diabetes ([place]) 10/15/2010  ? Hyperlipidemia 10/15/2010  ? Obesity 10/14/2010  ? Obstructive sleep apnea 02/21/2009  ? GERD 11/29/2005  ? ? ?REFERRING DIAG: Bilateral LE lymphedema ? ?THERAPY DIAG: Lymphedema, not elsewhere classified ? ?PERTINENT HISTORY: Htn, CHF, COPD, OA, RT LE cellulitis, obesity ? ?PRECAUTIONS: cellulitis ? ?SUBJECTIVE: Pt states that her knees are bothering her due to the weather. Marland Kitchen ?PAIN:  ?Are you having pain? No ?PAIN:  ?Are you having pain? Yes: NPRS scale: 4/10 ?Pain location: Bil knees ?Pain description: achey ?Aggravating factors: weather ?Relieving factors: rest ? ? ? ? ? ?TODAY'S TREATMENT:  ? 05/06/21 0001  ?Manual Therapy  ?Manual Therapy Manual Lymphatic Drainage (MLD);Compression Bandaging;Other (comment)  ?Manual therapy comments completed seperate from all other aspects of treatment  ?Manual Lymphatic Drainage (MLD) supracalvicular, deep and superfical abdominal inguinal/axillary anastomosis f/b B LE  ?Compression Bandaging Multilayer short stretch with 1/2in foam Bil thigh  ?Other Manual Therapy Measurements, discussed daily lymph care and manual self care  ? ? ? 05/04/21 0001  ?Manual Therapy  ?Manual Therapy Manual Lymphatic Drainage (MLD);Compression Bandaging;Other (comment)  ?Manual therapy  comments completed seperate from all other aspects of treatment  ?Manual Lymphatic Drainage (MLD) supracalvicular, deep and superfical abdominal inguinal/axillary anastomosis f/b B LE  ?Compression Bandaging Multilayer short stretch with 1/2in foam Bil thigh  ?Other Manual Therapy Measurements, discussed daily lymph care and manual self care  ? ? 04/29/21 0001  ?Manual Therapy  ?Manual Therapy Manual Lymphatic Drainage (MLD);Compression Bandaging;Other (comment)  ?Manual therapy comments completed seperate from all other aspects of treatment  ?Manual Lymphatic Drainage (MLD) supracalvicular, deep and superfical abdominal inguinal/axillary anastomosis f/b B LE; posterior not  completed due to time.  ?Compression Bandaging Multilayer short stretch with 1/2in foam Bil thigh  ?Other Manual Therapy Measurements, discussed daily lymph care and manual self care  ? ? 04/24/21 0001  ?Manual Therapy  ?Manual Therapy Manual Lymphatic Drainage (MLD);Compression Bandaging;Other (comment)  ?Manual therapy comments completed seperate from all other aspects of treatment  ?  Manual Lymphatic Drainage (MLD) supracalvicular, deep and superfical abdominal inguinal/axillary anastomosis f/b B LE; posterior not  completed due to time.  ?Compression Bandaging Multilayer short stretch with 1/2in foam Bil thigh  ?Other Manual Therapy Measurements, demonstrated donning compression garments and juxtafit, discussed which compression best for pt  ? ? ?   Right Lower Extremity Lymphedema  Below measurements in centimeters ? Eval 01/07/21  03/12/21 03/19/21 04/06/21 04/15/21 04/24/21 04/29/21 05/06/21  ?20 cm Proximal to Suprapatella 68.5 66  65.5 67.5 65.8 68.3 68 70  ?10 cm Proximal to Suprapatella 68.5 67  65 66.5 65.8 65 62.3 66.5  ?At Midpatella/Popliteal Crease  54.5 57.6 56.4 54 55.3 54.6 55 52.5  ?30 cm Proximal to Floor at Lateral Plantar Foot 60.2 51.2 54 54.5 55.8 55 54 57  ?20 cm Proximal to Floor at Lateral Plantar Foot 46.8 41.7 48 48 47.8 45.5  46.7 48.5  ?10 cm Proximal to Floor at Lateral Malleoli 33.5 27.9 29.5 30.5 31.8 30.5 30 32.3  ?Circumference of ankle/heel 37 34.4 35 36.5 36.5 36.9 36 37.5  ?5 cm Proximal to 1st MTP Joint 27.5 25.1 26.

## 2021-05-08 ENCOUNTER — Ambulatory Visit (HOSPITAL_COMMUNITY): Payer: Medicaid Other | Admitting: Physical Therapy

## 2021-05-08 DIAGNOSIS — I890 Lymphedema, not elsewhere classified: Secondary | ICD-10-CM

## 2021-05-08 NOTE — Therapy (Signed)
?OUTPATIENT PHYSICAL THERAPY Lymphedema TREATMENT NOTE ? ? ?Patient Name: [name] ?MRN: [id number] ?DOB:[birth year], 61 y.o., female ?Today's Date: 05/08/2021 ? ?PCP: Christiana Fuchs, DO ?REFERRING PROVIDER: Aldine Contes, MD ? ? PT End of Session - 04/24/21 1437   ? ? Visit Number 23  ? Number of Visits 27  ? Date for PT Re-Evaluation 05/15/21  ? Authorization Type Healthy blue   ? Authorization Time Period Health blue approved 27 total treatments. .  ? Authorization - Visit Number 22  ? Authorization - Number of Visits  27  ? Progress Note Due on Visit 27  ? PT Start Time 1025  ? PT Stop Time 1155  ? PT Time Calculation (min) 90 min   ? Activity Tolerance Patient tolerated treatment well   ? Behavior During Therapy [facility] for tasks assessed/performed   ? ?  ?  ? ?  ? ? ? ?Past Medical History:  ?Diagnosis Date  ? Acute on chronic diastolic (congestive) heart failure ([place]) 06/25/2013  ? Acute on chronic respiratory failure ([place]) 06/15/2018  ? Anemia, iron deficiency   ? secondary to menhorrhagia, on oral iron, also b12 def, getting monthly b12 shots  ? Cellulitis 05/2019  ? RIGHT LOWER EXTREMITY  ? CHF (congestive heart failure) ([place])   ? Chronic cough   ? secondary to alleriges and post nasal drip  ? Cognitive impairment   ? COPD (chronic obstructive pulmonary disease) ([place])   ? Cor pulmonale (HCC)   ? [place] pressure 32mHg  ? Depression   ? Diabetes mellitus   ? well controlled on metformin  ? Diastolic heart failure ([place]   ? GERD (gastroesophageal reflux disease)   ? H/O mental retardation   ? Herpes   ? Hyperlipidemia   ? Hypertension   ? OSA (obstructive sleep apnea)   ? CPAP  ? Pulmonary hypertension ([place]   ? Renal disorder   ? Restrictive lung disease   ? PFTs 06/2012 (FVC 54% predicted and FEV1 68% predicted w minimal bronchodilator response).  ? Shortness of breath   ? Venous stasis ulcer ([place]   ? chornic, ?followed up at wound care center, multiple courses of antibiotics in past for cellulitis, on  lasix  ? ?Past Surgical History:  ?Procedure Laterality Date  ? CARDIAC CATHETERIZATION N/A 01/13/2016  ? Procedure: Right Heart Cath;  Surgeon: DJolaine Artist MD;  [facility];  Service: Cardiovascular;  Laterality: N/A;  ? CHOLECYSTECTOMY    ? COLONOSCOPY WITH PROPOFOL N/A 12/20/2019  ? Procedure: COLONOSCOPY WITH PROPOFOL;  Surgeon: JMilus Banister MD;  Location: [facility];  Service: Endoscopy;  Laterality: N/A;  ? IR FLUORO GUIDE CV LINE LEFT  11/16/2019  ? IR FLUORO GUIDE CV LINE LEFT  06/17/2020  ? IR FLUORO GUIDE CV LINE LEFT  07/04/2020  ? IR FLUORO GUIDE CV LINE RIGHT  05/09/2019  ? IR FLUORO GUIDE CV LINE RIGHT  08/12/2019  ? IR REMOVAL TUN CV CATH W/O FL  05/05/2019  ? IR [place] VASC ACCESS LEFT  11/16/2019  ? IR [place]IDE VASC ACCESS LEFT  06/17/2020  ? IR [place]IDE VASC ACCESS LEFT  07/04/2020  ? IR [place]IDE VASC ACCESS RIGHT  05/09/2019  ? IR [place]IDE VASC ACCESS RIGHT  08/12/2019  ? IR [place] VASC ACCESS RIGHT  11/16/2019  ? IR VENO/JUGULAR RIGHT  11/16/2019  ? LEFT AND RIGHT HEART CATHETERIZATION WITH CORONARY ANGIOGRAM N/A 02/28/2014  ? Procedure: LEFT AND  RIGHT HEART CATHETERIZATION WITH CORONARY ANGIOGRAM;  Surgeon: Jolaine Artist, MD;  Location: [facility];  Service: Cardiovascular;  Laterality: N/A;  ? RIGHT HEART CATH N/A 06/27/2018  ? Procedure: RIGHT HEART CATH;  Surgeon: Jolaine Artist, MD;  Location: [facility] LAB;  Service: Cardiovascular;  Laterality: N/A;  ? RIGHT/LEFT HEART CATH AND CORONARY ANGIOGRAPHY N/A 05/14/2019  ? Procedure: RIGHT/LEFT HEART CATH AND CORONARY ANGIOGRAPHY;  Surgeon: Jolaine Artist, MD;  Location: [facility] LAB;  Service: Cardiovascular;  Laterality: N/A;  ? ?Patient Active Problem List  ? Diagnosis Date Noted  ? Vomiting 03/26/2021  ? Knee osteoarthritis 12/12/2020  ? Vision changes 09/02/2020  ? Depression 09/02/2020  ? Swelling of lower extremity 08/13/2020  ? Left paraspinal back pain 05/05/2020  ? Hearing difficulty of both  ears 05/05/2020  ? Internal hemorrhoid 01/11/2020  ? External hemorrhoid 01/11/2020  ? Fibroid, uterine 11/07/2019  ? Vagina bleeding 10/15/2019  ? 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 Bleeding from the genitourinary system 09/13/2019  ? Gross hematuria 09/13/2019  ? Chronic venous stasis 05/07/2019  ? Urinary incontinence 12/12/2018  ? 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 Right ovarian cyst 05/19/2017  ? Peripheral neuropathy ([place]) 09/27/2014  ? Healthcare maintenance 09/27/2014  ? Atrial flutter ([place]) 08/17/2012  ? Right heart failure, NYHA class 3 ([place]) 03/15/2012  ? Allergic rhinitis 11/16/2011  ? Diabetes ([place]) 10/15/2010  ? Hyperlipidemia 10/15/2010  ? Obesity 10/14/2010  ? Obstructive sleep apnea 02/21/2009  ? GERD 11/29/2005  ? ? ?REFERRING DIAG: Bilateral LE lymphedema ? ?THERAPY DIAG: No diagnosis found. ? ?PERTINENT HISTORY: Htn, CHF, COPD, OA, RT LE cellulitis, obesity ? ?PRECAUTIONS: cellulitis ? ?SUBJECTIVE: Pt states that her knees are bothering her due to the weather. Marland Kitchen ?PAIN:  ?Are you having pain? No ?PAIN:  ?Are you having pain? Yes: NPRS scale: 4/10 ?Pain location: Bil knees ?Pain description: achey ?Aggravating factors: weather ?Relieving factors: rest ? ? ? ? ? ?TODAY'S TREATMENT:  ? ? 05/08/21 0001  ?Manual Therapy  ?Manual Therapy Manual Lymphatic Drainage (MLD);Compression Bandaging;Other (comment)  ?Manual therapy comments completed seperate from all other aspects of treatment  ?Manual Lymphatic Drainage (MLD) supracalvicular, deep and superfical abdominal inguinal/axillary anastomosis f/b B LE posterior completed side lying   ?Compression Bandaging Multilayer short stretch with 1/2in foam toes to Bil thigh  ? ? 05/06/21 0001  ?Manual Therapy  ?Manual Therapy Manual Lymphatic Drainage (MLD);Compression Bandaging;Other (comment)  ?Manual therapy comments completed seperate from all other aspects of treatment  ?Manual Lymphatic Drainage (MLD)  supracalvicular, deep and superfical abdominal inguinal/axillary anastomosis f/b B LE  ?Compression Bandaging Multilayer short stretch with 1/2in foam Bil thigh  ?Other Manual Therapy Measurements, discussed daily lymph care and manual self care  ? ? ? 05/04/21 0001  ?Manual Therapy  ?Manual Therapy Manual Lymphatic Drainage (MLD);Compression Bandaging;Other (comment)  ?Manual therapy comments completed seperate from all other aspects of treatment  ?Manual Lymphatic Drainage (MLD) supracalvicular, deep and superfical abdominal inguinal/axillary anastomosis f/b B LE  ?Compression Bandaging Multilayer short stretch with 1/2in foam Bil thigh  ?Other Manual Therapy Measurements, discussed daily lymph care and manual self care  ? ? 04/29/21 0001  ?Manual Therapy  ?Manual Therapy Manual Lymphatic Drainage (MLD);Compression Bandaging;Other (comment)  ?Manual therapy comments completed seperate from all other aspects of treatment  ?Manual Lymphatic Drainage (MLD) supracalvicular, deep and superfical abdominal inguinal/axillary anastomosis f/b B LE; posterior not  completed due to time.  ?Compression Bandaging Multilayer  short stretch with 1/2in foam Bil thigh  ?Other Manual Therapy Measurements, discussed daily lymph care and manual self care  ? ? 04/24/21 0001  ?Manual Therapy  ?Manual Therapy Manual Lymphatic Drainage (MLD);Compression Bandaging;Other (comment)  ?Manual therapy comments completed seperate from all other aspects of treatment  ?Manual Lymphatic Drainage (MLD) supracalvicular, deep and superfical abdominal inguinal/axillary anastomosis f/b B LE; posterior not  completed due to time.  ?Compression Bandaging Multilayer short stretch with 1/2in foam Bil thigh  ?Other Manual Therapy Measurements, demonstrated donning compression garments and juxtafit, discussed which compression best for pt  ? ? ?   Right Lower Extremity Lymphedema  Below measurements in centimeters ? Eval 01/07/21  03/12/21 03/19/21 04/06/21 04/15/21  04/24/21 04/29/21 05/06/21  ?20 cm Proximal to Suprapatella 68.5 66  65.5 67.5 65.8 68.3 68 70  ?10 cm Proximal to Suprapatella 68.5 67  65 66.5 65.8 65 62.3 66.5  ?At Midpatella/Popliteal Crease  54.5 57.6 56.4 54 55.3 5

## 2021-05-11 ENCOUNTER — Encounter (HOSPITAL_COMMUNITY): Payer: Medicaid Other | Admitting: Physical Therapy

## 2021-05-13 ENCOUNTER — Other Ambulatory Visit: Payer: Self-pay

## 2021-05-13 ENCOUNTER — Emergency Department (HOSPITAL_COMMUNITY)
Admission: EM | Admit: 2021-05-13 | Discharge: 2021-05-14 | Disposition: A | Discharge status: Home / Self Care | Payer: Medicaid Other | Attending: Emergency Medicine | Admitting: Emergency Medicine

## 2021-05-13 ENCOUNTER — Telehealth (HOSPITAL_COMMUNITY): Payer: Self-pay | Admitting: Physical Therapy

## 2021-05-13 ENCOUNTER — Encounter (HOSPITAL_COMMUNITY): Payer: Self-pay | Admitting: Emergency Medicine

## 2021-05-13 ENCOUNTER — Encounter (HOSPITAL_COMMUNITY): Payer: Medicaid Other | Admitting: Physical Therapy

## 2021-05-13 DIAGNOSIS — E119 Type 2 diabetes mellitus without complications: Secondary | ICD-10-CM | POA: Diagnosis not present

## 2021-05-13 DIAGNOSIS — Z79899 Other long term (current) drug therapy: Secondary | ICD-10-CM | POA: Insufficient documentation

## 2021-05-13 DIAGNOSIS — R21 Rash and other nonspecific skin eruption: Secondary | ICD-10-CM

## 2021-05-13 DIAGNOSIS — T7840XA Allergy, unspecified, initial encounter: Secondary | ICD-10-CM

## 2021-05-13 DIAGNOSIS — Z7984 Long term (current) use of oral hypoglycemic drugs: Secondary | ICD-10-CM | POA: Insufficient documentation

## 2021-05-13 DIAGNOSIS — L509 Urticaria, unspecified: Secondary | ICD-10-CM | POA: Insufficient documentation

## 2021-05-13 DIAGNOSIS — L500 Allergic urticaria: Secondary | ICD-10-CM | POA: Diagnosis not present

## 2021-05-13 DIAGNOSIS — R079 Chest pain, unspecified: Secondary | ICD-10-CM | POA: Diagnosis not present

## 2021-05-13 DIAGNOSIS — I10 Essential (primary) hypertension: Secondary | ICD-10-CM | POA: Insufficient documentation

## 2021-05-13 DIAGNOSIS — R0689 Other abnormalities of breathing: Secondary | ICD-10-CM | POA: Diagnosis not present

## 2021-05-13 DIAGNOSIS — Z91040 Latex allergy status: Secondary | ICD-10-CM | POA: Insufficient documentation

## 2021-05-13 DIAGNOSIS — R0789 Other chest pain: Secondary | ICD-10-CM | POA: Diagnosis not present

## 2021-05-13 LAB — CBC WITH DIFFERENTIAL/PLATELET
Abs Immature Granulocytes: 0.07 10*3/uL (ref 0.00–0.07)
Basophils Absolute: 0 10*3/uL (ref 0.0–0.1)
Basophils Relative: 0 %
Eosinophils Absolute: 0 10*3/uL (ref 0.0–0.5)
Eosinophils Relative: 0 %
HCT: 51 % — ABNORMAL HIGH (ref 36.0–46.0)
Hemoglobin: 16 g/dL — ABNORMAL HIGH (ref 12.0–15.0)
Immature Granulocytes: 1 %
Lymphocytes Relative: 18 %
Lymphs Abs: 1.7 10*3/uL (ref 0.7–4.0)
MCH: 28.1 pg (ref 26.0–34.0)
MCHC: 31.4 g/dL (ref 30.0–36.0)
MCV: 89.5 fL (ref 80.0–100.0)
Monocytes Absolute: 1 10*3/uL (ref 0.1–1.0)
Monocytes Relative: 11 %
Neutro Abs: 6.4 10*3/uL (ref 1.7–7.7)
Neutrophils Relative %: 70 %
Platelets: 422 10*3/uL — ABNORMAL HIGH (ref 150–400)
RBC: 5.7 MIL/uL — ABNORMAL HIGH (ref 3.87–5.11)
RDW: 19.9 % — ABNORMAL HIGH (ref 11.5–15.5)
WBC: 9.1 10*3/uL (ref 4.0–10.5)
nRBC: 0 % (ref 0.0–0.2)

## 2021-05-13 LAB — BASIC METABOLIC PANEL
Anion gap: 10 (ref 5–15)
BUN: 20 mg/dL (ref 6–20)
CO2: 28 mmol/L (ref 22–32)
Calcium: 9.1 mg/dL (ref 8.9–10.3)
Chloride: 103 mmol/L (ref 98–111)
Creatinine, Ser: 0.87 mg/dL (ref 0.44–1.00)
GFR, Estimated: >60 mL/min (ref >60)
Glucose, Bld: 120 mg/dL — ABNORMAL HIGH (ref 70–99)
Potassium: 3.2 mmol/L — ABNORMAL LOW (ref 3.5–5.1)
Sodium: 141 mmol/L (ref 135–145)

## 2021-05-13 LAB — PROTIME-INR
INR: 1.2 (ref 0.8–1.2)
Prothrombin Time: 15.4 seconds — ABNORMAL HIGH (ref 11.4–15.2)

## 2021-05-13 MED ORDER — PREDNISONE 10 MG PO TABS: 20.0000 mg | ORAL_TABLET | Freq: Every day | ORAL | 0 refills | Status: AC

## 2021-05-13 MED ORDER — DIPHENHYDRAMINE HCL 25 MG PO TABS: 25.0000 mg | ORAL_TABLET | Freq: Four times a day (QID) | ORAL | 0 refills | Status: AC | PRN

## 2021-05-13 MED ORDER — DIPHENHYDRAMINE HCL 25 MG PO CAPS
25.0000 mg | ORAL_CAPSULE | Freq: Once | ORAL | Status: AC
  Administered 2021-05-13: 25 mg via ORAL
  Filled 2021-05-13: qty 1

## 2021-05-13 MED ORDER — PREDNISONE 20 MG PO TABS
50.0000 mg | ORAL_TABLET | Freq: Once | ORAL | Status: AC
  Administered 2021-05-13: 50 mg via ORAL
  Filled 2021-05-13: qty 3

## 2021-05-13 NOTE — Telephone Encounter (Signed)
PT called cancelled today's appointment due to transportation issues.  Therapist called patient back with no answer.  Therapist left message requesting pt to call [place] and get an appointment time to be fitted for juxtfit compression garments.   ? ?Rayetta Humphrey, PT CLT ?[phone]  ?

## 2021-05-13 NOTE — Telephone Encounter (Signed)
Pt l/m _0  lastnigth to cx 05/13/21---she could not get transportation - she had the wrong number in her phone ?

## 2021-05-13 NOTE — ED Notes (Signed)
Pt states she took her medications "about 3-4 hours ago" and she states that she noticed blurred vision. Hx of cataracts. And haven't seen her eye doctor in "a while" pt states that "I just feel tired right now, but I don't know if I took too many of my medications"  ?

## 2021-05-13 NOTE — ED Provider Triage Note (Signed)
Emergency Medicine Provider Triage Evaluation Note ? ?[name] , a 61 y.o. female  was evaluated in triage.  Pt complains of visual change following her remodulin dose.  This occurred about 30 minutes ago.  Per chart review she has similar history several months ago.  She describes boxes moving across a field of vision.  She reports she had 2 seconds where she might have lost vision.  Denies headache, pain of the eye. ? ?Review of Systems  ?Positive: As above ?Negative: As above ? ?Physical Exam  ?LMP 02/19/2011  ?Gen:   Awake, no distress   ?Resp:  Normal effort  ?MSK:   Moves extremities without difficulty  ?Other:  Neuro exam without focal deficits.  PERRL ? ?Medical Decision Making  ?Medically screening exam initiated at 4:20 PM.  Appropriate orders placed.  Rosellen A Platten was informed that the remainder of the evaluation will be completed by another provider, this initial triage assessment does not replace that evaluation, and the importance of remaining in the ED until their evaluation is complete. ? ? ?  ?Evlyn Courier, PA-C ?05/13/21 1622 ? ?

## 2021-05-13 NOTE — ED Provider Notes (Signed)
?[facility] ?Provider Note ? ? ?CSN: [id number] ?Arrival date & time: 05/13/21  1557 ? ?  ? ?History ? ?Chief Complaint  ?Patient presents with  ? Allergic Reaction  ? ? ?[name] is a 61 y.o. female. ? ?Patient is a 61 year old female presenting for allergic reaction.  Patient states she took her treprostinil (REMODULIN) and within seconds began having vision changes.  States she had yellow-Kadence Mimbs coloration in bilateral eyes that occurred against after taking medication and lasted less than 1 minute.  States she was concerned so came to the emergency department.  States all symptoms have resolved at this time.  Denies any shortness of breath, wheezing, difficulty swallowing, tongue swelling, or rash.  States she had this reaction once in the past but none recently. ? ?Hives on exam.  ? ?The history is provided by the patient. No language interpreter was used.  ?Allergic Reaction ?Presenting symptoms: no rash   ? ?  ? ?Home Medications ?Prior to Admission medications   ?Medication Sig Start Date End Date Taking? Authorizing Provider  ?diphenhydrAMINE (BENADRYL) 25 MG tablet Take 1 tablet (25 mg total) by mouth every 6 (six) hours as needed. 1/60/73  Yes Campbell Stall P, DO  ?predniSONE (DELTASONE) 10 MG tablet Take 2 tablets (20 mg total) by mouth daily for 5 days. 05/13/21 07/13/60 Yes Lianne Cure, DO  ?ACCU-CHEK AVIVA PLUS test strip USE 1 STRIP TO CHECK GLUCOSE ONCE DAILY ?Patient not taking: Reported on 10/24/2020 05/25/18   [name], MD  ?acetaminophen (TYLENOL) 500 MG tablet Take 1,000 mg by mouth every 6 (six) hours as needed.    [provider]  ?carboxymethylcellulose (REFRESH PLUS) 0.5 % SOLN 1 drop 3 (three) times daily as needed.    [provider]  ?cetirizine (EQ ALLERGY RELIEF, CETIRIZINE,) 10 MG tablet Take 1 tablet (10 mg total) by mouth daily. 12/09/20   Masters, [name], DO  ?cycloSPORINE (RESTASIS) 0.05 % ophthalmic emulsion  Place 1 drop into both eyes 2 (two) times daily as needed (dry eyes). ?Patient not taking: Reported on 10/24/2020    [provider]  ?diclofenac Sodium (VOLTAREN) 1 % GEL Apply 4 g topically 4 (four) times daily. ?Patient not taking: Reported on 02/24/2021 12/11/20   Marianna Payment, MD  ?diphenhydrAMINE (BENADRYL) 25 MG tablet Take 50-100 mg by mouth 2 (two) times daily as needed for itching.    [provider]  ?Ferrous Sulfate (IRON PO) Take by mouth.    [provider]  ?FIBER ADULT GUMMIES PO Take by mouth.    [provider]  ?fluticasone (FLONASE) 50 MCG/ACT nasal spray Place 1 spray into both nostrils daily as needed for allergies. 05/13/20 05/13/21  [name], MD  ?gabapentin (NEURONTIN) 300 MG capsule TAKE TWO CAPSULES BY MOUTH THREE TIMES A DAY 02/25/21   Masters, [name], DO  ?metFORMIN (GLUCOPHAGE) 500 MG tablet TAKE HALF TABLET BY MOUTH DAILY WITH BREAKFAST 03/31/21   Masters, Katie, DO  ?OXYGEN Inhale 4 L into the lungs continuous.    [provider]  ?pantoprazole (PROTONIX) 20 MG tablet Take 1 tablet (20 mg total) by mouth 2 (two) times daily. 12/09/20   Masters, [name], DO  ?potassium chloride SA (KLOR-CON M) 20 MEQ tablet TAKE TWO TABLETS BY MOUTH TWICE A DAY 04/01/21   Masters, [name], DO  ?pravastatin (PRAVACHOL) 20 MG tablet Take 1 tablet (20 mg total) by mouth daily. 12/09/20   Masters, [name], DO  ?rivaroxaban (XARELTO) 20  MG TABS tablet Take 1 tablet (20 mg total) by mouth daily with supper. 12/09/20   Masters, [name], DO  ?sildenafil (REVATIO) 20 MG tablet TAKE 1 TABLET THREE TIMES A DAY 10/02/20   Bensimhon, [name], MD  ?spironolactone (ALDACTONE) 25 MG tablet Take 1/2 tablet by mouth once daily. 12/09/20   Masters, [name], DO  ?torsemide (DEMADEX) 20 MG tablet Take 4 tablets (80 mg total) by mouth 2 (two) times daily. 12/09/20   Masters, [name], DO  ?treprostinil (REMODULIN) 5 MG/ML SOLN injection See admin instructions. As of 08/09/2019: Add 7 ml of  Remodulin to cassette and 93 ml of sterile diluent for Remodulin to cassetet for a total volume of 100 ml to make a concentration of 350,000 ng per ml.  Infuse via a CADD pump intravenously at a rate of 40 ml per 24 hours. Based on a dosing weight of 120 kg, the dose is 81 ng per kg per min.  Once opened, discard Remodulin vial after 30 days.  Sterile Diluent for Remodulin vials are single use only. Once mixed, discard cassette after 48 hours.    [provider]  ?vitamin B-12 (CYANOCOBALAMIN) 500 MCG tablet Take 1 tablet (500 mcg total) by mouth daily. 04/30/21   Masters, [name], DO  ?   ? ?Allergies    ?Aspirin, Codeine, Lisinopril, Sulfonamide derivatives, and Latex   ? ?Review of Systems   ?Review of Systems  ?Constitutional:  Negative for chills and fever.  ?HENT:  Negative for ear pain and sore throat.   ?Eyes:  Positive for visual disturbance. Negative for pain.  ?Respiratory:  Negative for cough and shortness of breath.   ?Cardiovascular:  Negative for chest pain and palpitations.  ?Gastrointestinal:  Negative for abdominal pain and vomiting.  ?Genitourinary:  Negative for dysuria and hematuria.  ?Musculoskeletal:  Negative for arthralgias and back pain.  ?Skin:  Negative for color change and rash.  ?Neurological:  Negative for seizures and syncope.  ?All other systems reviewed and are negative. ? ?Physical Exam ?Updated Vital Signs ?BP 120/63   Pulse 72   Temp 97.7 ?F (36.5 ?C) (Oral)   Resp (!) 22   LMP 02/19/2011   SpO2 93%  ?Physical Exam ?Vitals and nursing note reviewed.  ?Constitutional:   ?   General: She is not in acute distress. ?   Appearance: She is well-developed.  ?HENT:  ?   Head: Normocephalic and atraumatic.  ?   Mouth/Throat:  ?   Lips: Pink.  ?   Mouth: Mucous membranes are moist.  ?   Pharynx: Oropharynx is clear.  ?Eyes:  ?   General: Lids are normal. Vision grossly intact.  ?   Conjunctiva/sclera: Conjunctivae normal.  ?   Pupils: Pupils are equal, round, and reactive to  light.  ?   Visual Fields: Right eye visual fields normal and left eye visual fields normal.  ?Cardiovascular:  ?   Rate and Rhythm: Normal rate and regular rhythm.  ?   Heart sounds: No murmur heard. ?Pulmonary:  ?   Effort: Pulmonary effort is normal. No respiratory distress.  ?   Breath sounds: Normal breath sounds.  ?Abdominal:  ?   Palpations: Abdomen is soft.  ?   Tenderness: There is no abdominal tenderness.  ?Musculoskeletal:     ?   General: No swelling.  ?   Cervical back: Neck supple.  ?Skin: ?   General: Skin is warm and dry.  ?   Capillary Refill: Capillary refill takes  less than 2 seconds.  ?   Findings: Rash present. Rash is urticarial.  ?   Comments: Urticarial rash on face and neck  ?Neurological:  ?   Mental Status: She is alert.  ?Psychiatric:     ?   Mood and Affect: Mood normal.  ? ? ?ED Results / Procedures / Treatments   ?Labs ?(all labs ordered are listed, but only abnormal results are displayed) ?Labs Reviewed  ?CBC WITH DIFFERENTIAL/PLATELET - Abnormal; Notable for the following components:  ?    Result Value  ? RBC 5.70 (*)   ? Hemoglobin 16.0 (*)   ? HCT 51.0 (*)   ? RDW 19.9 (*)   ? Platelets 422 (*)   ? All other components within normal limits  ?BASIC METABOLIC PANEL - Abnormal; Notable for the following components:  ? Potassium 3.2 (*)   ? Glucose, Bld 120 (*)   ? All other components within normal limits  ?PROTIME-INR - Abnormal; Notable for the following components:  ? Prothrombin Time 15.4 (*)   ? All other components within normal limits  ? ? ?EKG ?None ? ?Radiology ?No results found. ? ?Procedures ?Procedures  ? ? ?Medications Ordered in ED ?Medications  ?diphenhydrAMINE (BENADRYL) capsule 25 mg (25 mg Oral Given 05/13/21 2238)  ?predniSONE (DELTASONE) tablet 50 mg (50 mg Oral Given 05/13/21 2238)  ? ? ?ED Course/ Medical Decision Making/ A&P ?  ?                        ?Medical Decision Making ?Risk ?Prescription drug management. ? ? ?11:36 PM ?Is a 61 year old female presenting  for allergic reaction after taking home medications.  Patient admits to vision changes lasting seconds including Tereasa Yilmaz-yellow discoloration.  No repeat symptoms.  Patient now has you to [place] rash on face and neck.  Arminda Resides

## 2021-05-13 NOTE — ED Triage Notes (Signed)
BIB EMS from home. Called out for possible allergic reaction because Remodulin she takes for her pulmonary htn and thinks she got too large of a dose of that.  Has vision changes.  Seeing black dots.  Always wears 4L [place] at home. 94% on that ?

## 2021-05-15 ENCOUNTER — Ambulatory Visit (HOSPITAL_COMMUNITY): Payer: Medicaid Other | Admitting: Physical Therapy

## 2021-05-15 ENCOUNTER — Telehealth: Payer: Self-pay

## 2021-05-15 ENCOUNTER — Encounter (HOSPITAL_COMMUNITY): Payer: Self-pay | Admitting: Physical Therapy

## 2021-05-15 DIAGNOSIS — I890 Lymphedema, not elsewhere classified: Secondary | ICD-10-CM

## 2021-05-15 NOTE — Therapy (Signed)
[facility] ?[address] ?[place], [place], [address] ?Phone: [phone]   Fax:  [phone] ? ?Patient Details  ?Name: [name] ?MRN: [id number] ?Date of Birth: Apr 20, 1960 ?Referring Provider:  No ref. provider found ? ?Encounter Date: 05/15/2021 ? ? ? ?Pt came for treatment. When therapist went to call pt back pt had a wet cloth over her face.  Therapist inquired how she was doing.  Pt stated that she was throwing up all last night and this morning.  Therapist cancelled appointment.   ?Rayetta Humphrey, PT CLT ?[phone]  ?05/15/2021, 9:52 AM ? ?[facility] ?[address] ?[place], [place], [address] ?Phone: [phone]   Fax:  [phone] ?

## 2021-05-15 NOTE — Therapy (Signed)
Pt came for treatment. When therapist went to call pt back pt had a wet cloth over her face.  Therapist inquired how she was doing.  Pt stated that she was throwing up all last night and this morning.  Therapist cancelled appointment.   ?Rayetta Humphrey, PT CLT ?[phone]  ?05/15/2021, 9:52 AM ?

## 2021-05-15 NOTE — Telephone Encounter (Signed)
Transition Care Management Unsuccessful Follow-up Telephone Call ? ?Date of discharge and from where:  05/14/2021 from [facility] ? ?Attempts:  1st Attempt ? ?Reason for unsuccessful TCM follow-up call:  Left voice message ? ? ? ?

## 2021-05-18 ENCOUNTER — Encounter (HOSPITAL_COMMUNITY): Payer: Medicaid Other | Admitting: Physical Therapy

## 2021-05-19 ENCOUNTER — Encounter (HOSPITAL_COMMUNITY): Payer: Medicaid Other | Admitting: Physical Therapy

## 2021-05-19 NOTE — Telephone Encounter (Signed)
Transition Care Management Follow-up Telephone Call ?Date of discharge and from where: 05/14/2021 from [facility] ?How have you been since you were released from the hospital? Patient stated that she is feeling better and did not have any questions or concerns at this time.  ?Any questions or concerns? No ? ?Items Reviewed: ?Did the pt receive and understand the discharge instructions provided? Yes  ?Medications obtained and verified? Yes  ?Other? No  ?Any new allergies since your discharge? No  ?Dietary orders reviewed? No ?Do you have support at home? Yes  ? ?Functional Questionnaire: (I = Independent and D = Dependent) ?ADLs: I ? ?Bathing/Dressing- I ? ?Meal Prep- I ? ?Eating- I ? ?Maintaining continence- I ? ?Transferring/Ambulation- I ? ?Managing Meds- I ? ? ?Follow up appointments reviewed: ? ?PCP Hospital f/u appt confirmed? No  Patient will follow up if sx return.  ?[facility] f/u appt confirmed? No   ?Are transportation arrangements needed? No  ?If their condition worsens, is the pt aware to call PCP or go to the Emergency Dept.? Yes ?Was the patient provided with contact information for the PCP's office or ED? Yes ?Was to pt encouraged to call back with questions or concerns? Yes ? ?

## 2021-05-20 ENCOUNTER — Encounter (HOSPITAL_COMMUNITY): Payer: Medicaid Other | Admitting: Physical Therapy

## 2021-05-25 ENCOUNTER — Encounter (HOSPITAL_COMMUNITY): Payer: Medicaid Other | Admitting: Physical Therapy

## 2021-05-26 ENCOUNTER — Other Ambulatory Visit: Payer: Self-pay | Admitting: *Deleted

## 2021-05-26 NOTE — Patient Outreach (Signed)
Care Coordination  05/26/2021  [name] 1960/03/21 [id number]   Medicaid Managed Care   Unsuccessful Outreach Note  05/26/2021 Name: [name] MRN: [id number] DOB: Oct 13, 1960  Referred by: Masters, [name], DO Reason for referral : High Risk Managed Medicaid (Unsuccessful RNCM follow up telephone outreach)   An unsuccessful telephone outreach was attempted today. The patient was referred to the case management team for assistance with care management and care coordination.   Follow Up Plan: A HIPAA compliant phone message was left for the patient providing contact information and requesting a return call.   [name] RN, BSN [place]  [employer] manager

## 2021-05-26 NOTE — Patient Instructions (Signed)
Visit Information  Ms. LORELAI HUYSER  - as a part of your Medicaid benefit, you are eligible for care management and care coordination services at no cost or copay. I was unable to reach you by phone today but would be happy to help you with your health related needs. Please feel free to call me @ [phone].   A member of the Managed Medicaid care management team will reach out to you again over the next 14 days.   [name] RN, BSN [place]  [employer] manager

## 2021-05-29 ENCOUNTER — Encounter (HOSPITAL_COMMUNITY): Payer: Medicaid Other

## 2021-06-03 ENCOUNTER — Encounter (HOSPITAL_COMMUNITY): Payer: Medicaid Other | Admitting: Physical Therapy

## 2021-06-04 DIAGNOSIS — J984 Other disorders of lung: Secondary | ICD-10-CM | POA: Diagnosis not present

## 2021-06-04 DIAGNOSIS — I270 Primary pulmonary hypertension: Secondary | ICD-10-CM | POA: Diagnosis not present

## 2021-06-04 DIAGNOSIS — I5030 Unspecified diastolic (congestive) heart failure: Secondary | ICD-10-CM | POA: Diagnosis not present

## 2021-06-04 DIAGNOSIS — G4730 Sleep apnea, unspecified: Secondary | ICD-10-CM | POA: Diagnosis not present

## 2021-06-05 ENCOUNTER — Encounter (HOSPITAL_COMMUNITY): Payer: Medicaid Other | Admitting: Physical Therapy

## 2021-06-08 ENCOUNTER — Other Ambulatory Visit: Payer: Self-pay | Admitting: *Deleted

## 2021-06-08 ENCOUNTER — Other Ambulatory Visit: Payer: Self-pay | Admitting: Internal Medicine

## 2021-06-08 DIAGNOSIS — J069 Acute upper respiratory infection, unspecified: Secondary | ICD-10-CM

## 2021-06-08 DIAGNOSIS — G629 Polyneuropathy, unspecified: Secondary | ICD-10-CM

## 2021-06-08 DIAGNOSIS — I50810 Right heart failure, unspecified: Secondary | ICD-10-CM

## 2021-06-08 NOTE — Patient Instructions (Signed)
Visit Information  Ms. YARITHZA MINK  - as a part of your Medicaid benefit, you are eligible for care management and care coordination services at no cost or copay. I was unable to reach you by phone today but would be happy to help you with your health related needs. Please feel free to call me @ [phone].   A member of the Managed Medicaid care management team will reach out to you again over the next 14 days.   [name] RN, BSN [place]  [employer] manager

## 2021-06-08 NOTE — Patient Outreach (Signed)
Care Coordination  06/08/2021  [name] 1960-12-05 [id number]   Medicaid Managed Care   Unsuccessful Outreach Note  06/08/2021 Name: [name] MRN: [id number] DOB: [birth year]  Referred by: Masters, [name], DO Reason for referral : High Risk Managed Medicaid (Unsuccessful RNCM follow up telephone outreach)   A second unsuccessful telephone outreach was attempted today. The patient was referred to the case management team for assistance with care management and care coordination.   Follow Up Plan: A HIPAA compliant phone message was left for the patient providing contact information and requesting a return call.   [name] RN, BSN [place]  [employer] manager

## 2021-06-16 ENCOUNTER — Telehealth: Payer: Self-pay | Admitting: Internal Medicine

## 2021-06-16 DIAGNOSIS — I272 Pulmonary hypertension, unspecified: Secondary | ICD-10-CM | POA: Diagnosis not present

## 2021-06-16 DIAGNOSIS — R0609 Other forms of dyspnea: Secondary | ICD-10-CM | POA: Diagnosis not present

## 2021-06-16 NOTE — Telephone Encounter (Signed)
..  Medicaid Managed Care   Unsuccessful Outreach Note  06/16/2021 Name: [name] MRN: [id number] DOB: 02/05/1960  Referred by: Masters, [name], DO Reason for referral : High Risk Managed Medicaid (I called the patient today to get her rescheduled with the MM RNCM. She did not answer and her VM was full.)   A second unsuccessful telephone outreach was attempted today. The patient was referred to the case management team for assistance with care management and care coordination.   Follow Up Plan: The care management team will reach out to the patient again over the next 7 days.    [place]

## 2021-06-26 DIAGNOSIS — N3946 Mixed incontinence: Secondary | ICD-10-CM | POA: Diagnosis not present

## 2021-06-26 DIAGNOSIS — E119 Type 2 diabetes mellitus without complications: Secondary | ICD-10-CM | POA: Diagnosis not present

## 2021-07-02 ENCOUNTER — Encounter (HOSPITAL_COMMUNITY): Payer: Self-pay

## 2021-07-02 ENCOUNTER — Emergency Department (HOSPITAL_COMMUNITY)
Admission: EM | Admit: 2021-07-02 | Discharge: 2021-07-03 | Disposition: A | Discharge status: Home / Self Care | Payer: Medicaid Other | Attending: Emergency Medicine | Admitting: Emergency Medicine

## 2021-07-02 ENCOUNTER — Emergency Department (HOSPITAL_COMMUNITY): Payer: Medicaid Other

## 2021-07-02 DIAGNOSIS — Y848 Other medical procedures as the cause of abnormal reaction of the patient, or of later complication, without mention of misadventure at the time of the procedure: Secondary | ICD-10-CM | POA: Insufficient documentation

## 2021-07-02 DIAGNOSIS — I2721 Secondary pulmonary arterial hypertension: Secondary | ICD-10-CM | POA: Diagnosis not present

## 2021-07-02 DIAGNOSIS — Z91040 Latex allergy status: Secondary | ICD-10-CM | POA: Insufficient documentation

## 2021-07-02 DIAGNOSIS — R0600 Dyspnea, unspecified: Secondary | ICD-10-CM | POA: Insufficient documentation

## 2021-07-02 DIAGNOSIS — R9431 Abnormal electrocardiogram [ECG] [EKG]: Secondary | ICD-10-CM | POA: Diagnosis not present

## 2021-07-02 DIAGNOSIS — T82524D Displacement of infusion catheter, subsequent encounter: Secondary | ICD-10-CM | POA: Diagnosis not present

## 2021-07-02 DIAGNOSIS — T82524A Displacement of infusion catheter, initial encounter: Secondary | ICD-10-CM | POA: Diagnosis not present

## 2021-07-02 DIAGNOSIS — Z79899 Other long term (current) drug therapy: Secondary | ICD-10-CM | POA: Diagnosis not present

## 2021-07-02 DIAGNOSIS — I10 Essential (primary) hypertension: Secondary | ICD-10-CM | POA: Diagnosis not present

## 2021-07-02 DIAGNOSIS — I272 Pulmonary hypertension, unspecified: Secondary | ICD-10-CM | POA: Diagnosis not present

## 2021-07-02 DIAGNOSIS — T82528A Displacement of other cardiac and vascular devices and implants, initial encounter: Secondary | ICD-10-CM

## 2021-07-02 DIAGNOSIS — Z452 Encounter for adjustment and management of vascular access device: Secondary | ICD-10-CM | POA: Diagnosis not present

## 2021-07-02 LAB — BASIC METABOLIC PANEL
Anion gap: 13 (ref 5–15)
BUN: 10 mg/dL (ref 6–20)
CO2: 27 mmol/L (ref 22–32)
Calcium: 9.3 mg/dL (ref 8.9–10.3)
Chloride: 100 mmol/L (ref 98–111)
Creatinine, Ser: 0.79 mg/dL (ref 0.44–1.00)
GFR, Estimated: >60 mL/min (ref >60)
Glucose, Bld: 94 mg/dL (ref 70–99)
Potassium: 3.9 mmol/L (ref 3.5–5.1)
Sodium: 140 mmol/L (ref 135–145)

## 2021-07-02 LAB — CBC WITH DIFFERENTIAL/PLATELET
Abs Immature Granulocytes: 0.08 10*3/uL — ABNORMAL HIGH (ref 0.00–0.07)
Basophils Absolute: 0 10*3/uL (ref 0.0–0.1)
Basophils Relative: 0 %
Eosinophils Absolute: 0 10*3/uL (ref 0.0–0.5)
Eosinophils Relative: 0 %
HCT: 40 % (ref 36.0–46.0)
Hemoglobin: 13.2 g/dL (ref 12.0–15.0)
Immature Granulocytes: 1 %
Lymphocytes Relative: 22 %
Lymphs Abs: 2.3 10*3/uL (ref 0.7–4.0)
MCH: 29.5 pg (ref 26.0–34.0)
MCHC: 33 g/dL (ref 30.0–36.0)
MCV: 89.5 fL (ref 80.0–100.0)
Monocytes Absolute: 1.3 10*3/uL — ABNORMAL HIGH (ref 0.1–1.0)
Monocytes Relative: 12 %
Neutro Abs: 6.5 10*3/uL (ref 1.7–7.7)
Neutrophils Relative %: 65 %
Platelets: 453 10*3/uL — ABNORMAL HIGH (ref 150–400)
RBC: 4.47 MIL/uL (ref 3.87–5.11)
RDW: 16.3 % — ABNORMAL HIGH (ref 11.5–15.5)
WBC: 10.2 10*3/uL (ref 4.0–10.5)
nRBC: 0 % (ref 0.0–0.2)

## 2021-07-02 LAB — BRAIN NATRIURETIC PEPTIDE: B Natriuretic Peptide: 29.1 pg/mL (ref 0.0–100.0)

## 2021-07-02 NOTE — ED Provider Triage Note (Signed)
Emergency Medicine Provider Triage Evaluation Note  [name] , a 61 y.o. female  was evaluated in triage.  Pt complains of accidentally removing her port a cath.  Patient receives Remodulin for her pulmonary hypertension.  She states that earlier today she somehow accidentally removed her Port-A-Cath.  Patient is supposed to receive a continuous infusion of medication.  Has baseline shortness of breath but does not endorse worsening symptoms at this time.  Patient states that in the past interventional radiology and pharmacy have had to work together to help arrange replacement of the Port-A-Cath  Review of Systems  Positive: Removed Port-A-Cath Negative: Shortness of breath (beyond baseline)  Physical Exam  Temp 98.7 F (37.1 C) (Oral)   Resp 18   LMP 02/19/2011  Gen:   Awake, no distress   Resp:  Normal effort  MSK:   Moves extremities without difficulty  Other:    Medical Decision Making  Medically screening exam initiated at 9:20 PM.  Appropriate orders placed.  Kafi A Hine was informed that the remainder of the evaluation will be completed by another provider, this initial triage assessment does not replace that evaluation, and the importance of remaining in the ED until their evaluation is complete.     Ronny Bacon 07/02/21 [id number]

## 2021-07-02 NOTE — ED Triage Notes (Signed)
Pt reports her implanted port a cath accidentally came out today. Mild redness to left upper chest where the port was. Hx of pulmonary HTN and takes a long term drug that she receives via her port.

## 2021-07-03 ENCOUNTER — Emergency Department (HOSPITAL_COMMUNITY): Payer: Medicaid Other

## 2021-07-03 DIAGNOSIS — Z452 Encounter for adjustment and management of vascular access device: Secondary | ICD-10-CM | POA: Diagnosis not present

## 2021-07-03 DIAGNOSIS — I272 Pulmonary hypertension, unspecified: Secondary | ICD-10-CM | POA: Diagnosis not present

## 2021-07-03 HISTORY — PX: IR US GUIDE VASC ACCESS LEFT: IMG2389

## 2021-07-03 HISTORY — PX: IR FLUORO GUIDE CV LINE LEFT: IMG2282

## 2021-07-03 MED ORDER — HEPARIN SOD (PORK) LOCK FLUSH 100 UNIT/ML IV SOLN
INTRAVENOUS | Status: DC
Start: 2021-07-03 — End: 2021-07-03
  Filled 2021-07-03: qty 5

## 2021-07-03 MED ORDER — LIDOCAINE HCL 1 % IJ SOLN
INTRAMUSCULAR | Status: DC
Start: 2021-07-03 — End: 2021-07-03
  Filled 2021-07-03: qty 20

## 2021-07-03 NOTE — ED Notes (Signed)
Reviewed discharge instructions with patient. Follow-up care reviewed. Patient verbalized understanding. Patient A&Ox4, VSS, and ambulatory with steady gait upon discharge.

## 2021-07-03 NOTE — Procedures (Signed)
Interventional Radiology Procedure Note  Procedure: Left IJ Tunneled PICC   Indication: Dislodged Left IJ Tunneled PICC  Findings: Please refer to procedural dictation for full description.  Complications: None  EBL: < 10 mL  Miachel Roux, MD [phone]

## 2021-07-03 NOTE — ED Notes (Signed)
Dr. Pollina at bedside   

## 2021-07-03 NOTE — ED Notes (Signed)
Pt found to be in IR

## 2021-07-03 NOTE — ED Provider Notes (Signed)
Blood pressure 119/70, pulse 68, temperature 97.6 F (36.4 C), temperature source Oral, resp. rate 14, last menstrual period 02/19/2011, SpO2 97 %.  Assuming care from Dr. Betsey Holiday.  In short, [name] is a 61 y.o. female with a chief complaint of Vascular Access Problem .  Refer to the original H&P for additional details.  The current plan of care is to follow up after replacement of tunneled catheter by IR.  09:10 AM  Patient returned from IR with catheter in place. Stable for d/c.     Margette Fast, MD 07/03/21 [phone]

## 2021-07-03 NOTE — ED Notes (Addendum)
Notified Long MD about pt being back from IR and requesting to start her medication that she brought with her.  Correction. Pt reports that she doesn't have the medication with her; it is at home. Notified provider.

## 2021-07-03 NOTE — ED Provider Notes (Signed)
[facility] EMERGENCY DEPARTMENT Provider Note   CSN: [id number] Arrival date & time: 07/02/21  2057     History  Chief Complaint  Patient presents with   Vascular Access Problem    [name] is a 61 y.o. female.  Patient presents to the emergency department for evaluation of dislodged tunneled catheter.  The patient has a history of pulmonary hypertension and receives Remodulin through her catheter.  Patient reports that it inadvertently became dislodged, she is not sure what happened.  No bleeding from the site.  She reports that this is happened 6 or 7 times in the past, she is at a loss to explain it.       Home Medications Prior to Admission medications   Medication Sig Start Date End Date Taking? Authorizing Provider  ACCU-CHEK AVIVA PLUS test strip USE 1 STRIP TO CHECK GLUCOSE ONCE DAILY Patient not taking: Reported on 10/24/2020 05/25/18   [name], MD  acetaminophen (TYLENOL) 500 MG tablet Take 1,000 mg by mouth every 6 (six) hours as needed.    [provider]  carboxymethylcellulose (REFRESH PLUS) 0.5 % SOLN 1 drop 3 (three) times daily as needed.    [provider]  cetirizine (ZYRTEC) 10 MG tablet TAKE ONE TABLET BY MOUTH DAILY 06/09/21   Masters, [name], DO  cycloSPORINE (RESTASIS) 0.05 % ophthalmic emulsion Place 1 drop into both eyes 2 (two) times daily as needed (dry eyes). Patient not taking: Reported on 10/24/2020    [provider]  diclofenac Sodium (VOLTAREN) 1 % GEL Apply 4 g topically 4 (four) times daily. Patient not taking: Reported on 02/24/2021 12/11/20   Marianna Payment, MD  diphenhydrAMINE (BENADRYL) 25 MG tablet Take 50-100 mg by mouth 2 (two) times daily as needed for itching.    [provider]  diphenhydrAMINE (BENADRYL) 25 MG tablet Take 1 tablet (25 mg total) by mouth every 6 (six) hours as needed. 0/76/22   Campbell Stall P, DO  Ferrous Sulfate (IRON PO) Take by mouth.    [provider]  FIBER ADULT GUMMIES PO Take by mouth.    [provider]  fluticasone (FLONASE) 50 MCG/ACT nasal spray Place 1 spray into both nostrils daily as needed for allergies. 05/13/20 05/13/21  [name], MD  gabapentin (NEURONTIN) 300 MG capsule TAKE TWO CAPSULES BY MOUTH THREE TIMES A DAY 06/09/21   Masters, [name], DO  metFORMIN (GLUCOPHAGE) 500 MG tablet TAKE HALF TABLET BY MOUTH DAILY WITH BREAKFAST 03/31/21   Masters, Katie, DO  OXYGEN Inhale 4 L into the lungs continuous.    [provider]  pantoprazole (PROTONIX) 20 MG tablet Take 1 tablet (20 mg total) by mouth 2 (two) times daily. 12/09/20   Masters, Katie, DO  potassium chloride SA (KLOR-CON M) 20 MEQ tablet TAKE TWO TABLETS BY MOUTH TWICE A DAY 04/01/21   Masters, Katie, DO  pravastatin (PRAVACHOL) 20 MG tablet Take 1 tablet (20 mg total) by mouth daily. 12/09/20   Masters, [name], DO  rivaroxaban (XARELTO) 20 MG TABS tablet Take 1 tablet (20 mg total) by mouth daily with supper. 12/09/20   Masters, Katie, DO  sildenafil (REVATIO) 20 MG tablet TAKE 1 TABLET THREE TIMES A DAY 10/02/20   Bensimhon, [name], MD  spironolactone (ALDACTONE) 25 MG tablet TAKE HALF TABLET BY MOUTH DAILY 06/09/21   Masters, [name], DO  torsemide (DEMADEX) 20 MG tablet Take 4 tablets (80 mg total) by mouth 2 (two) times daily. 12/09/20  Masters, [name], DO  treprostinil (REMODULIN) 5 MG/ML SOLN injection See admin instructions. As of 08/09/2019: Add 7 ml of Remodulin to cassette and 93 ml of sterile diluent for Remodulin to cassetet for a total volume of 100 ml to make a concentration of 350,000 ng per ml.  Infuse via a CADD pump intravenously at a rate of 40 ml per 24 hours. Based on a dosing weight of 120 kg, the dose is 81 ng per kg per min.  Once opened, discard Remodulin vial after 30 days.  Sterile Diluent for Remodulin vials are single use only. Once mixed, discard cassette after 48 hours.    [provider]  vitamin B-12  (CYANOCOBALAMIN) 500 MCG tablet Take 1 tablet (500 mcg total) by mouth daily. 04/30/21   Masters, [name], DO      Allergies    Aspirin, Codeine, Lisinopril, Sulfonamide derivatives, and Latex    Review of Systems   Review of Systems  Physical Exam Updated Vital Signs BP 115/74 (BP Location: Left Arm)   Pulse (!) 52   Temp 98.7 F (37.1 C) (Oral)   Resp 18   LMP 02/19/2011   SpO2 96%  Physical Exam Vitals and nursing note reviewed.  Constitutional:      General: She is not in acute distress.    Appearance: She is well-developed.  HENT:     Head: Normocephalic and atraumatic.     Mouth/Throat:     Mouth: Mucous membranes are moist.  Eyes:     General: Vision grossly intact. Gaze aligned appropriately.     Extraocular Movements: Extraocular movements intact.     Conjunctiva/sclera: Conjunctivae normal.  Cardiovascular:     Rate and Rhythm: Normal rate and regular rhythm.     Pulses: Normal pulses.     Heart sounds: Normal heart sounds, S1 normal and S2 normal. No murmur heard.    No friction rub. No gallop.  Pulmonary:     Effort: Pulmonary effort is normal. No respiratory distress.     Breath sounds: Normal breath sounds.  Abdominal:     General: Bowel sounds are normal.     Palpations: Abdomen is soft.     Tenderness: There is no abdominal tenderness. There is no guarding or rebound.     Hernia: No hernia is present.  Musculoskeletal:        General: No swelling.     Cervical back: Full passive range of motion without pain, normal range of motion and neck supple. No spinous process tenderness or muscular tenderness. Normal range of motion.     Right lower leg: No edema.     Left lower leg: No edema.  Skin:    General: Skin is warm and dry.     Capillary Refill: Capillary refill takes less than 2 seconds.     Findings: No ecchymosis, erythema, rash or wound.     Comments: Catheter site left upper chest dry, no bleeding, no signs of infection.  Neurological:      General: No focal deficit present.     Mental Status: She is alert and oriented to person, place, and time.     GCS: GCS eye subscore is 4. GCS verbal subscore is 5. GCS motor subscore is 6.     Cranial Nerves: Cranial nerves 2-12 are intact.     Sensory: Sensation is intact.     Motor: Motor function is intact.     Coordination: Coordination is intact.  Psychiatric:  Attention and Perception: Attention normal.        Mood and Affect: Mood normal.        Speech: Speech normal.        Behavior: Behavior normal.     ED Results / Procedures / Treatments   Labs (all labs ordered are listed, but only abnormal results are displayed) Labs Reviewed  CBC WITH DIFFERENTIAL/PLATELET - Abnormal; Notable for the following components:      Result Value   RDW 16.3 (*)    Platelets 453 (*)    Monocytes Absolute 1.3 (*)    Abs Immature Granulocytes 0.08 (*)    All other components within normal limits  BASIC METABOLIC PANEL  BRAIN NATRIURETIC PEPTIDE    EKG EKG Interpretation  Date/Time:  Thursday July 02 2021 21:33:12 EDT Ventricular Rate:  72 PR Interval:  220 QRS Duration: 80 QT Interval:  424 QTC Calculation: 464 R Axis:   131 Text Interpretation: Sinus rhythm with 1st degree A-V block Right axis deviation Possible Right ventricular hypertrophy Abnormal ECG Confirmed by Orpah Greek [phone]) on 07/03/2021 5:33:07 AM  Radiology DG Chest 2 View  Result Date: 07/02/2021 CLINICAL DATA:  Dyspnea EXAM: CHEST - 2 VIEW COMPARISON:  02/01/2021 FINDINGS: The lungs are symmetrically well expanded. No pneumothorax or pleural effusion. There is mild perihilar and lower lung zone interstitial pulmonary infiltrate likely reflecting mild pulmonary edema. Cardiomegaly is stable. Enlargement of the central pulmonary vasculature in keeping with pulmonary arterial hypertension is again noted. No acute bone abnormality. IMPRESSION: 1. Mild cardiogenic failure.  Stable cardiomegaly. 2.  Pulmonary arterial hypertension. Electronically Signed   By: Fidela Salisbury M.D.   On: 07/02/2021 21:57    Procedures Procedures    Medications Ordered in ED Medications - No data to display  ED Course/ Medical Decision Making/ A&P                           Medical Decision Making  Patient presents to the emergency department for replacing of her central venous access line that inadvertently became dislodged.  No sign of any significant trauma at the site.  Chest x-ray is clear, no pneumothorax, no retained foreign body.  Patient will require replacement of the access for her Remodulin administration.  We will contact interventional radiology to replace line.        Final Clinical Impression(s) / ED Diagnoses Final diagnoses:  Displacement of central venous catheter (CVC) [facility])    Rx / DC Orders ED Discharge Orders     None         Kasie Leccese, Gwenyth Allegra, MD 07/03/21 [phone]

## 2021-07-03 NOTE — ED Notes (Signed)
Oxygen tank replaced °

## 2021-07-03 NOTE — Discharge Instructions (Signed)
Continue your medications at home as prescribed. Please be very careful with your catheter and continue your outpatient follow up appointments as scheduled. Return to the ED with any new or suddenly worsening symptoms.

## 2021-07-03 NOTE — ED Notes (Signed)
Pt back in hallway from IR

## 2021-07-03 NOTE — ED Notes (Signed)
Pt noted to not be in hallway, but her walker is still there.

## 2021-07-04 DIAGNOSIS — I5030 Unspecified diastolic (congestive) heart failure: Secondary | ICD-10-CM | POA: Diagnosis not present

## 2021-07-04 DIAGNOSIS — G4730 Sleep apnea, unspecified: Secondary | ICD-10-CM | POA: Diagnosis not present

## 2021-07-04 DIAGNOSIS — J984 Other disorders of lung: Secondary | ICD-10-CM | POA: Diagnosis not present

## 2021-07-04 DIAGNOSIS — I270 Primary pulmonary hypertension: Secondary | ICD-10-CM | POA: Diagnosis not present

## 2021-07-08 ENCOUNTER — Other Ambulatory Visit: Payer: Self-pay | Admitting: Internal Medicine

## 2021-07-08 DIAGNOSIS — I50810 Right heart failure, unspecified: Secondary | ICD-10-CM

## 2021-07-08 DIAGNOSIS — I5033 Acute on chronic diastolic (congestive) heart failure: Secondary | ICD-10-CM

## 2021-07-17 ENCOUNTER — Other Ambulatory Visit: Payer: Self-pay | Admitting: *Deleted

## 2021-07-17 NOTE — Patient Outreach (Signed)
  Medicaid Managed Care   Unsuccessful Attempt Note   07/17/2021 Name: [name] MRN: [id number] DOB: [birth year]  Referred by: Masters, [name], DO Reason for referral : High Risk Managed Medicaid (Unsuccessful RNCM follow up outreach)   Third unsuccessful telephone outreach was attempted today. The patient was referred to the case management team for assistance with care management and care coordination. The patient's primary care provider has been notified of our unsuccessful attempts to make or maintain contact with the patient. The care management team is pleased to engage with this patient at any time in the future should he/she be interested in assistance from the care management team.    Follow Up Plan: The Managed Medicaid care management team is available to follow up with the patient after provider conversation with the patient regarding recommendation for care management engagement and subsequent re-referral to the care management team.     [name] RN, BSN [place] RN Care Coordinator

## 2021-07-20 ENCOUNTER — Encounter (HOSPITAL_COMMUNITY): Payer: Medicaid Other | Admitting: Internal Medicine

## 2021-07-31 ENCOUNTER — Other Ambulatory Visit: Payer: Self-pay

## 2021-07-31 ENCOUNTER — Encounter: Payer: Self-pay | Admitting: Internal Medicine

## 2021-07-31 ENCOUNTER — Ambulatory Visit: Payer: Medicaid Other | Admitting: Internal Medicine

## 2021-07-31 VITALS — BP 111/70 | HR 89 | Temp 98.2°F | Ht 63.0 in | Wt 263.6 lb

## 2021-07-31 DIAGNOSIS — K648 Other hemorrhoids: Secondary | ICD-10-CM

## 2021-07-31 DIAGNOSIS — R21 Rash and other nonspecific skin eruption: Secondary | ICD-10-CM | POA: Diagnosis not present

## 2021-07-31 DIAGNOSIS — H539 Unspecified visual disturbance: Secondary | ICD-10-CM | POA: Diagnosis not present

## 2021-07-31 DIAGNOSIS — I872 Venous insufficiency (chronic) (peripheral): Secondary | ICD-10-CM

## 2021-07-31 NOTE — Progress Notes (Unsigned)
Subjective:  CC: vision change  HPI:  [name] is a 61 y.o. female with a past medical history stated below and presents today for vision change, leg swelling, and rash. Please see problem based assessment and plan for additional details.  Past Medical History:  Diagnosis Date   Acute on chronic diastolic (congestive) heart failure ([place]) 06/25/2013   Acute on chronic respiratory failure ([place]) 06/15/2018   Anemia, iron deficiency    secondary to menhorrhagia, on oral iron, also b12 def, getting monthly b12 shots   Cellulitis 05/2019   RIGHT LOWER EXTREMITY   CHF (congestive heart failure) (HCC)    Chronic cough    secondary to alleriges and post nasal drip   Cognitive impairment    COPD (chronic obstructive pulmonary disease) (HCC)    Cor pulmonale (HCC)    PA Peak pressure 13mHg   Depression    Diabetes mellitus    well controlled on metformin   Diastolic heart failure (HCC)    GERD (gastroesophageal reflux disease)    H/O mental retardation    Herpes    Hyperlipidemia    Hypertension    OSA (obstructive sleep apnea)    CPAP   Pulmonary hypertension ([place]    Renal disorder    Restrictive lung disease    PFTs 06/2012 (FVC 54% predicted and FEV1 68% predicted w minimal bronchodilator response).   Shortness of breath    Venous stasis ulcer (HCC)    chornic, ?followed up at wound care center, multiple courses of antibiotics in past for cellulitis, on lasix    Current Outpatient Medications on File Prior to Visit  Medication Sig Dispense Refill   ACCU-CHEK AVIVA PLUS test strip USE 1 STRIP TO CHECK GLUCOSE ONCE DAILY (Patient not taking: Reported on 10/24/2020) 50 each 3   acetaminophen (TYLENOL) 500 MG tablet Take 1,000 mg by mouth every 6 (six) hours as needed.     carboxymethylcellulose (REFRESH PLUS) 0.5 % SOLN 1 drop 3 (three) times daily as needed.     cetirizine (ZYRTEC) 10 MG tablet TAKE ONE TABLET BY MOUTH DAILY 90 tablet 4   cycloSPORINE (RESTASIS)  0.05 % ophthalmic emulsion Place 1 drop into both eyes 2 (two) times daily as needed (dry eyes). (Patient not taking: Reported on 10/24/2020)     diclofenac Sodium (VOLTAREN) 1 % GEL Apply 4 g topically 4 (four) times daily. (Patient not taking: Reported on 02/24/2021) 350 g 3   diphenhydrAMINE (BENADRYL) 25 MG tablet Take 50-100 mg by mouth 2 (two) times daily as needed for itching.     diphenhydrAMINE (BENADRYL) 25 MG tablet Take 1 tablet (25 mg total) by mouth every 6 (six) hours as needed. 15 tablet 0   Ferrous Sulfate (IRON PO) Take by mouth.     FIBER ADULT GUMMIES PO Take by mouth.     fluticasone (FLONASE) 50 MCG/ACT nasal spray Place 1 spray into both nostrils daily as needed for allergies. 11.1 mL 1   gabapentin (NEURONTIN) 300 MG capsule TAKE TWO CAPSULES BY MOUTH THREE TIMES A DAY 180 capsule 2   metFORMIN (GLUCOPHAGE) 500 MG tablet TAKE HALF TABLET BY MOUTH DAILY WITH BREAKFAST 45 tablet 2   OXYGEN Inhale 4 L into the lungs continuous.     pantoprazole (PROTONIX) 20 MG tablet Take 1 tablet (20 mg total) by mouth 2 (two) times daily. 180 tablet 2   potassium chloride SA (KLOR-CON M) 20 MEQ tablet TAKE TWO TABLETS BY MOUTH TWICE A DAY  180 tablet 2   pravastatin (PRAVACHOL) 20 MG tablet Take 1 tablet (20 mg total) by mouth daily. 90 tablet 2   rivaroxaban (XARELTO) 20 MG TABS tablet Take 1 tablet (20 mg total) by mouth daily with supper. 90 tablet 2   sildenafil (REVATIO) 20 MG tablet TAKE 1 TABLET THREE TIMES A DAY 90 tablet 11   spironolactone (ALDACTONE) 25 MG tablet TAKE HALF TABLET BY MOUTH DAILY 45 tablet 4   torsemide (DEMADEX) 20 MG tablet TAKE FOUR TABLETS BY MOUTH TWICE A DAY 240 tablet 5   treprostinil (REMODULIN) 5 MG/ML SOLN injection See admin instructions. As of 08/09/2019: Add 7 ml of Remodulin to cassette and 93 ml of sterile diluent for Remodulin to cassetet for a total volume of 100 ml to make a concentration of 350,000 ng per ml.  Infuse via a CADD pump intravenously at a  rate of 40 ml per 24 hours. Based on a dosing weight of 120 kg, the dose is 81 ng per kg per min.  Once opened, discard Remodulin vial after 30 days.  Sterile Diluent for Remodulin vials are single use only. Once mixed, discard cassette after 48 hours.     vitamin B-12 (CYANOCOBALAMIN) 500 MCG tablet Take 1 tablet (500 mcg total) by mouth daily. 30 tablet 4   No current facility-administered medications on file prior to visit.    Family History  Problem Relation Age of Onset   Kidney Stones Son    Mental illness Sister    Bipolar disorder Sister    Mental retardation Brother    Hyperlipidemia Mother    Breast cancer Maternal Aunt    Colon cancer Neg Hx    Pancreatic cancer Neg Hx    Esophageal cancer Neg Hx     Social History   Socioeconomic History   Marital status: Single    Spouse name: Not on file   Number of children: Not on file   Years of education: Not on file   Highest education level: Not on file  Occupational History   Not on file  Tobacco Use   Smoking status: Never   Smokeless tobacco: Never  Vaping Use   Vaping Use: Never used  Substance and Sexual Activity   Alcohol use: No    Alcohol/week: 0.0 standard drinks of alcohol   Drug use: No   Sexual activity: Not Currently  Other Topics Concern   Not on file  Social History Narrative   Not on file   Social Determinants of Health   Financial Resource Strain: Low Risk  (06/21/2018)   Overall Financial Resource Strain (CARDIA)    Difficulty of Paying Living Expenses: Not very hard  Food Insecurity: No Food Insecurity (01/08/2021)   Hunger Vital Sign    Worried About Running Out of Food in the Last Year: Never true    Ran Out of Food in the Last Year: Never true  Transportation Needs: No Transportation Needs (02/24/2021)   PRAPARE - Hydrologist (Medical): No    Lack of Transportation (Non-Medical): No  Physical Activity: Insufficiently Active (04/22/2021)   Exercise Vital Sign     Days of Exercise per Week: 2 days    Minutes of Exercise per Session: 20 min  Stress: No Stress Concern Present (06/21/2018)   [place]    Feeling of Stress : Not at all  Social Connections: Socially Isolated (10/24/2020)   Social Connection and Isolation  Panel [NHANES]    Frequency of Communication with Friends and Family: Once a week    Frequency of Social Gatherings with Friends and Family: Once a week    Attends Religious Services: Never    Marine scientist or Organizations: No    Attends Archivist Meetings: Never    Marital Status: Never married  Intimate Partner Violence: Not At Risk (06/21/2018)   Humiliation, Afraid, Rape, and Kick questionnaire    Fear of Current or Ex-Partner: No    Emotionally Abused: No    Physically Abused: No    Sexually Abused: No    Review of Systems: ROS negative except for what is noted on the assessment and plan.  Objective:   Vitals:   07/31/21 1037  BP: 111/70  Pulse: 89  Temp: 98.2 F (36.8 C)  TempSrc: Oral  SpO2: 98%  Weight: 263 lb 9.6 oz (119.6 kg)  Height: _0  (1.6 m)    Physical Exam: Constitutional: Chronically ill-appearing, appears older than stated age, in no acute distress HENT: Excoriation present to back of neck, no erythema or purulence at site Eyes: conjunctiva non-erythematous, EO MI Cardiovascular: regular rate and rhythm, no m/r/g, catheter in place for remodulin Pulmonary/Chest: normal work of breathing on supplemental oxygen, lungs clear to auscultation bilaterally MSK: normal bulk and tone Neurological: alert & oriented x 3 Skin: See media tab for rash on neck, upper back, and changes to lower extremity bilaterally Psych: Normal mood and affect   Assessment & Plan:  Vision changes Patient presents with 45-monthhistory of worsening blurry vision.  She states that her vision up close and far away has gradually become more  blurry.  She has previously followed with an eye doctor.  She had cataract surgery several years ago and her vision improved after that.  She has been trying to get in with her eye doctor, but states that she was told she could not go due to her insurance.  When I saw her in November I referred her to an ophthalmologist at that time.  She states that she has continued to have problems getting appointment.  She denies headache, vision loss, jaw pain. On exam no gross abnormalities appreciated to her eyes.  She has extraocular motions intact, conjunctive nonErythematous, sclera nonicteric.  No tenderness present to temples bilaterally. Assessment: Differential includes secondary cataract.  I think she would be benefited from seeing an ophthalmologist for a more thorough exam.  [facility] at 1015. [facility] Ophthalm[place]: [place]Address: [address] [place] [address]  Phone: [phone] Internal hemorrhoid Ms. MNielsonis a 61year old with past medical history of severe pulmonary hypertension and right-sided heart failure.  She presents to clinic with several days of rectal bleeding.  She states that bleeding has happened off-and-on over the last couple of weeks.  She was seen in the ED recently as her central line for remodulin was displaced.  She was having rectal bleeding at that time.  Hemoglobin was stable at 13.2.  She does not have any pain with her bowel movements.  Denies constipation or diarrhea.  Last colonoscopy was done in November 2021 and showed internal hemorrhoids. A/P: I talked with patient that bleeding is likely from previously seen hemorrhoids.  We talked about if she is having worsening fatigue, shortness of breath, or is having more bleeding  to call the clinic to be evaluated.   Chronic venous insufficiency Ms. Corso is a 61 year old with  longstanding history of chronic venous insufficiency.  She has difficulty applying Unna boots.  She has previously been referred to lymphedema clinic at [facility], but has not been able to establish this and has difficulty with transportation.  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 A/P: We will have patient follow-up next week for placement of Unna boots.  Rash Patient presenting with rash on back of neck, right shoulder into back, and right lower calf.'s please see media tab.  Lesion on back of neck appears like necrotic excoriation, she has similar lesions on her upper back to her shoulder.  She also has a lesion on her right calf.  She states that areas initially start burning and she scratches them leading to wounds.  The area of her neck is the newest and has been present for the last 2 weeks. Assessment: Rash crosses dermatomes with left and right side of neck and upper back affected.  Low suspicion for shingles.  The surrounding area of the lesion does not appear erythematous and no purulent discharge noted.   Plan: I am unsure of what rash is from.  I asked patient to try using Benadryl, which she states she has at home to help prevent her from scratching as this could cause an infection.  Could consider biopsy of lesion at follow-up visit in 1 week.   Patient discussed with Dr. Wallace Cullens Miyoshi Ligas, D.O. [facility]  PGY-2 Pager: [phone]  Phone: [phone] Date 08/01/2021  Time 2:58 PM

## 2021-07-31 NOTE — Patient Instructions (Signed)
Thank you, [name] for allowing [place] to provide your care today. Today we discussed:  Eyes: I have called and made an appointment for you on August 9 at 1015 the contact information for your new eye doctor is listed below [employer] PA Ophthalmologist in [place], [place] in: [facility] Address: [address], [place], [place] [address]   Phone: [phone]  Rectal bleeding This bleeding sounds consistent with your known hemorrhoids.  If bleeding becomes more bothersome or continues past a couple of days please call the clinic back.  Also if you have worsening fatigue or dizziness we would need to see you back for that in case you are losing too much blood.  Legs Come back next week and we will help you with Unna boots.  You will have to come back weekly to have these replaced.  Rash I am not sure what your rash is at this time.  Please stop scratching them.  If they look more healed next week then we can consider giving you steroid cream to hopefully help these heal.     I have ordered the following labs for you:  Lab Orders  No laboratory test(s) ordered today     Referrals ordered today:   Referral Orders  No referral(s) requested today     I have ordered the following medication/changed the following medications:   Stop the following medications: There are no discontinued medications.   Start the following medications: No orders of the defined types were placed in this encounter.    Follow up:  1 week    We look forward to seeing you next time. Please call our clinic at [phone] if you have any questions or concerns. The best time to call is Monday-Friday from 9am-4pm, but there is someone available 24/7. If after hours or the weekend, call the main hospital number and ask for the Internal Medicine Resident On-Call. If you need medication refills, please notify your pharmacy one week in advance and they will send [place] a  request.   Thank you for trusting me with your care. Wishing you the best!   Christiana Fuchs, [place]

## 2021-08-01 DIAGNOSIS — I872 Venous insufficiency (chronic) (peripheral): Secondary | ICD-10-CM | POA: Insufficient documentation

## 2021-08-01 NOTE — Assessment & Plan Note (Addendum)
Patient presents with 64-monthhistory of worsening blurry vision.  She states that her vision up close and far away has gradually become more blurry.  She has previously followed with an eye doctor.  She had cataract surgery several years ago and her vision improved after that.  She has been trying to get in with her eye doctor, but states that she was told she could not go due to her insurance.  When I saw her in November I referred her to an ophthalmologist at that time.  She states that she has continued to have problems getting appointment.  She denies headache, vision loss, jaw pain. On exam no gross abnormalities appreciated to her eyes.  She has extraocular motions intact, conjunctive nonErythematous, sclera nonicteric.  No tenderness present to temples bilaterally. Assessment: Differential includes secondary cataract.  I think she would be benefited from seeing an ophthalmologist for a more thorough exam.  [facility] August [facility] at 1015. [facility] [place]hthalm[place]: [facility]ddress: [address] [address]  Phone: [phone]

## 2021-08-01 NOTE — Assessment & Plan Note (Signed)
[name] is a 61 year old with past medical history of severe pulmonary hypertension and right-sided heart failure.  She presents to clinic with several days of rectal bleeding.  She states that bleeding has happened off-and-on over the last couple of weeks.  She was seen in the ED recently as her central line for remodulin was displaced.  She was having rectal bleeding at that time.  Hemoglobin was stable at 13.2.  She does not have any pain with her bowel movements.  Denies constipation or diarrhea.  Last colonoscopy was done in November 2021 and showed internal hemorrhoids. A/P: I talked with patient that bleeding is likely from previously seen hemorrhoids.  We talked about if she is having worsening fatigue, shortness of breath, or is having more bleeding to call the clinic to be evaluated.

## 2021-08-01 NOTE — Assessment & Plan Note (Signed)
Patient presenting with rash on back of neck, right shoulder into back, and right lower calf.'s please see media tab.  Lesion on back of neck appears like necrotic excoriation, she has similar lesions on her upper back to her shoulder.  She also has a lesion on her right calf.  She states that areas initially start burning and she scratches them leading to wounds.  The area of her neck is the newest and has been present for the last 2 weeks. Assessment: Rash crosses dermatomes with left and right side of neck and upper back affected.  Low suspicion for shingles.  The surrounding area of the lesion does not appear erythematous and no purulent discharge noted.   Plan: I am unsure of what rash is from.  I asked patient to try using Benadryl, which she states she has at home to help prevent her from scratching as this could cause an infection.  Could consider biopsy of lesion at follow-up visit in 1 week.

## 2021-08-01 NOTE — Assessment & Plan Note (Signed)
[name] is a 61 year old with longstanding history of chronic venous insufficiency.  She has difficulty applying Unna boots.  She has previously been referred to lymphedema clinic at [facility], but has not been able to establish this and has difficulty with transportation.  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 A/P: We will have patient follow-up next week for placement of Unna boots.

## 2021-08-03 ENCOUNTER — Telehealth: Payer: Self-pay | Admitting: *Deleted

## 2021-08-03 NOTE — Telephone Encounter (Signed)
-----  Message from Ebbie Latus, RN sent at 08/03/2021 10:05 AM EDT ----- Regarding: FW: sending records to opthalmology  ----- Message ----- From: Christiana Fuchs, DO Sent: 08/01/2021   2:31 PM EDT To: Imp Triage Nurse Pool Subject: sending records to opthalmology                I forgot to obtain a record form from this more while she was in the clinic on Friday.  When I called and made the appointment for her ophthalmologist, they requested records be sent to their office prior to her appointment on August 9.  I believe she is going to follow-up next week in clinic.  What is the best way to make sure that she signed a record release at that time? Thank you for your help and sorry about forgetting to do that [employer]

## 2021-08-03 NOTE — Telephone Encounter (Signed)
Hme, front office, stated she will fax LOV notes to [facility] [address]

## 2021-08-03 NOTE — Progress Notes (Signed)
Internal Medicine Clinic Attending  Case discussed with Dr. Howie Ill  At the time of the visit.  We reviewed the resident's history and exam and pertinent patient test results.  I agree with the assessment, diagnosis, and plan of care documented in the resident's note.

## 2021-08-03 NOTE — Telephone Encounter (Signed)
Spoke to [place] at [facility]. Pt has an appt there on Aug 9th and requesting recent OV notes - faxed to [phone].

## 2021-08-04 DIAGNOSIS — G4730 Sleep apnea, unspecified: Secondary | ICD-10-CM | POA: Diagnosis not present

## 2021-08-04 DIAGNOSIS — I270 Primary pulmonary hypertension: Secondary | ICD-10-CM | POA: Diagnosis not present

## 2021-08-04 DIAGNOSIS — J984 Other disorders of lung: Secondary | ICD-10-CM | POA: Diagnosis not present

## 2021-08-04 DIAGNOSIS — I5030 Unspecified diastolic (congestive) heart failure: Secondary | ICD-10-CM | POA: Diagnosis not present

## 2021-08-11 DIAGNOSIS — E119 Type 2 diabetes mellitus without complications: Secondary | ICD-10-CM | POA: Diagnosis not present

## 2021-08-11 DIAGNOSIS — N3946 Mixed incontinence: Secondary | ICD-10-CM | POA: Diagnosis not present

## 2021-08-12 LAB — HM DIABETES EYE EXAM

## 2021-08-19 DIAGNOSIS — G4730 Sleep apnea, unspecified: Secondary | ICD-10-CM | POA: Diagnosis not present

## 2021-08-19 DIAGNOSIS — G4733 Obstructive sleep apnea (adult) (pediatric): Secondary | ICD-10-CM | POA: Diagnosis not present

## 2021-08-19 DIAGNOSIS — I270 Primary pulmonary hypertension: Secondary | ICD-10-CM | POA: Diagnosis not present

## 2021-08-26 NOTE — Progress Notes (Signed)
Advanced Heart Failure Clinic Note   Date:  08/27/2021   ID:  [name], DOB 1960/05/17, MRN [id number]  Location: Home  Provider location: [facility] Type of Visit: Established patient  PCP:  Christiana Fuchs, DO  Cardiologist:  None Primary HF: [place] PAH: [facility] Dr Gilles Chiquito   Chief Complaint: Heart Failure/PAH   History of Present Illness: [name] is a 61 y.o. 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    She was admitted to [facility] in 7/14 with respiratory failure and RHC showed severe PAH with pulmonary pressures in the 90s. She was transferred to [facility] where she was started on IV Remodulin with excellent results.   She was admitted 8/14 for CP and tachycardia, was found to be in atypical flutter vs atrial tach. Amiodarone was considered but she converted spontaneously. She was started on Xarelto. Her discharge weight was 215 lbs.    Cath 2/16: Minimal CAD LAD 30% otherwise normal RHC on Remodulin RA = 8 RV = 45/5/8 PA = 52/24 (34) PCW = 11 Fick cardiac output/index = 7.6/3.6 PVR = 3.0 WU FA sat = 99% PA sat = 70%, 71%  Admitted 6/20 with respiratory failure and recurrent AFL in setting of PNA.   Admitted 4/21 with severe sepsis from RLE cellulitis. Also had marked volume overload. Bumped troponin and underwent R/L cath. Minimal nonobstructive CAD. Echo 4/21 EF 65-70% RV moderately down .   Saw Dr Gilles Chiquito 06/2021. At that visit she had run out treprostinil and forgotten to replace for a day two.   Today she returns for HF follow up.No issues with IV Remodulin.  Every now and then she is short of breath. Remains on 4 liters [place]. Denies PND/Orthopnea. Denies syncope. Appetite ok. No fever or chills.  Weight at home has been stable. Taking all medications.   Studies: ECHO 08/11/12 EF 60-65% RV moderately dilated Peak PA pressure 48 mmhg  ECHO 02/28/13 EF 55-60% Grade I DD RV severely dilated and HK. Trivial TR. Septum flat Peak PA pressure 53 mmhg ECHO 11/15 EF 55-60% RV normal Trivial TR ECHO 1/16 at [facility] EF >55% RV mildly dilated (improved function) Trivial TR normal IVC ECHO 11/2014: EF 55-60%.  RV modrately dilated. Peak PA pressure 78 mm hg ECHO 03/2015: At [facility] RV severely dilated. EF >55% RVSP 57mHG [facility]  Echo 4/21 LVEF 65-70% RV moderately dilated/HK Echo 06/2021 - LVEF > 55%.  function. RV moderate dysfunction. Trivial MR.   [facility] Room air sat was 96% sats fell briefly to 89 at the very end of the walk she went 341 m this is 82% predicted 6MWD 9/17 was 362 m  This is 88% predicted on 3 L oxygen saturations did not fall below 93%. 6MWD 3/18 3466m6MW 2020 27222mMW 7/21 263m63mDuke erformed on 4 L of oxygen. Resting room air sat is 87%. On the 4 L she is 98 it drops to 90 with exertion. She ambulated 263 m this is 68% predicted last time very similar at 272 65nd similar the prior visit as well. 6MW 06/2021 263 meters   RHC/LHC 05/2019 1. 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 Normal LVEF 60-65%  3. RHC  Ao = 94/64 (78) LV = 86/11 RA =  6 RV = 69/4 PA = 63/17 (37) PCW = 7 Fick cardiac output/index = 6.0/2.8 PVR = 5.0 WU Ao sat = 96% PA sat = 66%, 68%  Past Medical History:  Diagnosis Date   Acute on chronic diastolic (congestive) heart failure ([place]) 06/25/2013   Acute on chronic respiratory failure ([place]) 06/15/2018   Anemia, iron deficiency    secondary to menhorrhagia, on oral iron, also b12 def, getting monthly b12 shots   Cellulitis 05/2019   RIGHT LOWER EXTREMITY   CHF (congestive heart failure) (HCC)    Chronic cough    secondary to alleriges and post nasal drip   Cognitive impairment    COPD (chronic  obstructive pulmonary disease) (HCC)    Cor pulmonale (HCC)    PA Peak pressure 29mHg   Depression    Diabetes mellitus    well controlled on metformin   Diastolic heart failure (HCC)    GERD (gastroesophageal reflux disease)    H/O mental retardation    Herpes    Hyperlipidemia    Hypertension    OSA (obstructive sleep apnea)    CPAP   Pulmonary hypertension ([place]    Renal disorder    Restrictive lung disease    PFTs 06/2012 (FVC 54% predicted and FEV1 68% predicted w minimal bronchodilator response).   Shortness of breath    Venous stasis ulcer ([place]    chornic, ?followed up at wound care center, multiple courses of antibiotics in past for cellulitis, on lasix   Past Surgical History:  Procedure Laterality Date   CARDIAC CATHETERIZATION N/A 01/13/2016   Procedure: Right Heart Cath;  Surgeon: DJolaine Artist MD;  [place] [facility];  Service: Cardiovascular;  Laterality: N/A;   CHOLECYSTECTOMY     COLONOSCOPY WITH PROPOFOL N/A 12/20/2019   Procedure: COLONOSCOPY WITH PROPOFOL;  Surgeon: JMilus Banister MD;  Location: [facility];  Service: Endoscopy;  Laterality: N/A;   IR FLUORO GUIDE CV LINE LEFT  11/16/2019   IR FLUORO GUIDE CV LINE LEFT  06/17/2020   IR FLUORO GUIDE CV LINE LEFT  07/04/2020   IR FLUORO GUIDE CV LINE LEFT  07/03/2021   IR FLUORO GUIDE CV LINE RIGHT  05/09/2019   IR FLUORO GUIDE CV LINE RIGHT  08/12/2019   IR REMOVAL TUN CV CATH W/O FL  05/05/2019   IR [place] ACCESS LEFT  11/16/2019   IR [place] VASC ACCESS LEFT  06/17/2020   IR [place] VASC ACCESS LEFT  07/04/2020   IR [place] VASC ACCESS LEFT  07/03/2021   IR [place] VASC ACCESS RIGHT  05/09/2019   IR [place] VASC ACCESS RIGHT  08/12/2019   IR [place] VASC ACCESS RIGHT  11/16/2019   IR VENO/JUGULAR RIGHT  11/16/2019   LEFT AND RIGHT HEART CATHETERIZATION WITH CORONARY ANGIOGRAM N/A 02/28/2014   Procedure: LEFT AND RIGHT HEART CATHETERIZATION WITH CORONARY ANGIOGRAM;  Surgeon: DJolaine Artist MD;  [facility];  Service: Cardiovascular;  Laterality: N/A;   RIGHT HEART CATH N/A 06/27/2018   Procedure: RIGHT HEART CATH;  Surgeon: BJolaine Artist MD;  [place];  Service: Cardiovascular;  Laterality: N/A;   RIGHT/LEFT HEART CATH AND CORONARY ANGIOGRAPHY N/A 05/14/2019   Procedure: RIGHT/LEFT HEART CATH AND CORONARY ANGIOGRAPHY;  Surgeon: BJolaine Artist MD;  [place];  Service: Cardiovascular;  Laterality: N/A;  Current Outpatient Medications  Medication Sig Dispense Refill   acetaminophen (TYLENOL) 500 MG tablet Take 1,000 mg by mouth every 6 (six) hours as needed.     carboxymethylcellulose (REFRESH PLUS) 0.5 % SOLN 1 drop 3 (three) times daily as needed.     cetirizine (ZYRTEC) 10 MG tablet TAKE ONE TABLET BY MOUTH DAILY 90 tablet 4   cycloSPORINE (RESTASIS) 0.05 % ophthalmic emulsion Place 1 drop into both eyes 2 (two) times daily as needed (dry eyes).     diphenhydrAMINE (BENADRYL) 25 MG tablet Take 1 tablet (25 mg total) by mouth every 6 (six) hours as needed. 15 tablet 0   Ferrous Sulfate (IRON PO) Take by mouth daily.     gabapentin (NEURONTIN) 300 MG capsule TAKE TWO CAPSULES BY MOUTH THREE TIMES A DAY 180 capsule 2   metFORMIN (GLUCOPHAGE) 500 MG tablet TAKE HALF TABLET BY MOUTH DAILY WITH BREAKFAST 45 tablet 2   OXYGEN Inhale 4 L into the lungs continuous.     pantoprazole (PROTONIX) 20 MG tablet Take 1 tablet (20 mg total) by mouth 2 (two) times daily. 180 tablet 2   potassium chloride SA (KLOR-CON M) 20 MEQ tablet TAKE TWO TABLETS BY MOUTH TWICE A DAY 180 tablet 2   pravastatin (PRAVACHOL) 20 MG tablet Take 1 tablet (20 mg total) by mouth daily. 90 tablet 2   rivaroxaban (XARELTO) 20 MG TABS tablet Take 1 tablet (20 mg total) by mouth daily with supper. 90 tablet 2   sildenafil (REVATIO) 20 MG tablet TAKE 1 TABLET THREE TIMES A DAY 90 tablet 11   spironolactone (ALDACTONE) 25 MG tablet TAKE HALF TABLET  BY MOUTH DAILY 45 tablet 4   torsemide (DEMADEX) 20 MG tablet TAKE FOUR TABLETS BY MOUTH TWICE A DAY 240 tablet 5   treprostinil (REMODULIN) 5 MG/ML SOLN injection See admin instructions. As of 08/09/2019: Add 7 ml of Remodulin to cassette and 93 ml of sterile diluent for Remodulin to cassetet for a total volume of 100 ml to make a concentration of 350,000 ng per ml.  Infuse via a CADD pump intravenously at a rate of 40 ml per 24 hours. Based on a dosing weight of 120 kg, the dose is 81 ng per kg per min.  Once opened, discard Remodulin vial after 30 days.  Sterile Diluent for Remodulin vials are single use only. Once mixed, discard cassette after 48 hours.     vitamin B-12 (CYANOCOBALAMIN) 500 MCG tablet Take 1 tablet (500 mcg total) by mouth daily. 30 tablet 4   ACCU-CHEK AVIVA PLUS test strip USE 1 STRIP TO CHECK GLUCOSE ONCE DAILY (Patient not taking: Reported on 08/27/2021) 50 each 3   fluticasone (FLONASE) 50 MCG/ACT nasal spray Place 1 spray into both nostrils daily as needed for allergies. 11.1 mL 1   No current facility-administered medications for this encounter.    Allergies:   Aspirin, Codeine, Lisinopril, Sulfonamide derivatives, and Latex   Social History:  The patient  reports that she has never smoked. She has never used smokeless tobacco. She reports that she does not drink alcohol and does not use drugs.   Family History:  The patient's family history includes Bipolar disorder in her sister; Breast cancer in her maternal aunt; Hyperlipidemia in her mother; Kidney Stones in her son; Mental illness in her sister; Mental retardation in her brother.   ROS:  Please see the history of present illness.   All other systems are personally reviewed and negative.   Vitals:  08/27/21 1124  BP: 114/62  Pulse: 91  SpO2: 90%  Weight: 120.7 kg (266 lb)    Wt Readings from Last 3 Encounters:  08/27/21 120.7 kg (266 lb)  07/31/21 119.6 kg (263 lb 9.6 oz)  12/11/20 124.1 kg (273 lb 8 oz)     General:  Walked in the clinic with a rolling walker. No resp difficulty HEENT: normal Neck: supple. no JVD. Carotids 2+ bilat; no bruits. No lymphadenopathy or thryomegaly . L upper chest tunneled PICC.  Cor: PMI nondisplaced. Regular rate & rhythm. No rubs, gallops or murmurs. Lungs: clear on 4 liters.  Abdomen: soft, nontender, nondistended. No hepatosplenomegaly. No bruits or masses. Good bowel sounds. Extremities: no cyanosis, clubbing, rash, R and LLE trace-1= edema Neuro: alert & orientedx3, cranial nerves grossly intact. moves all 4 extremities w/o difficulty. Affect pleasant  EKG: SR 81 bpm personally checked.    Recent Labs: 02/01/2021: ALT 15 07/02/2021: B Natriuretic Peptide 29.1; BUN 10; Creatinine, Ser 0.79; Hemoglobin 13.2; Platelets 453; Potassium 3.9; Sodium 140  Personally reviewed   Wt Readings from Last 3 Encounters:  08/27/21 120.7 kg (266 lb)  07/31/21 119.6 kg (263 lb 9.6 oz)  12/11/20 124.1 kg (273 lb 8 oz)      ASSESSMENT AND PLAN:  1) Chronic diastolic HF/cor pulmonale:  - NYHA  III.  Volume status stable. Continue torsemide back to 80 mg bid - Continue spiro 12.5 mg daily - Poor candidate for SGLT2i with hygiene issues - 2) Chronic venous statis  - Needs ongoing compression   3) PAH, severe with cor pulmonale.  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 However PAH far out of proportion to L-sided pressures or restrictive lung disease. - Follows with [facility] stable on Remodulin + sildenafil.   - Developed pulmonary edema with ERA in past so have not rechallenged  4) Paroxysmal atrial flutter.  - In SR  today on EKG.  - Continue Xarelto.   5) Chronic hypoxic respiratory failure - continue O2. Oxygen sats stable.   Follow up with Dr Haroldine Laws in 3 months.    Jeanmarie Hubert, NP  08/27/2021 11:34 AM  Advanced Heart Failure [place]  [phone] (office) [phone] (fax)

## 2021-08-27 ENCOUNTER — Encounter (HOSPITAL_COMMUNITY): Payer: Self-pay

## 2021-08-27 ENCOUNTER — Ambulatory Visit (INDEPENDENT_AMBULATORY_CARE_PROVIDER_SITE_OTHER): Payer: Medicaid Other | Admitting: Internal Medicine

## 2021-08-27 ENCOUNTER — Ambulatory Visit (HOSPITAL_COMMUNITY)
Admission: RE | Admit: 2021-08-27 | Discharge: 2021-08-27 | Disposition: A | Discharge status: Home / Self Care | Payer: Medicaid Other | Source: Ambulatory Visit | Attending: Adult Health | Admitting: Adult Health

## 2021-08-27 VITALS — BP 114/62 | HR 91 | Wt 266.0 lb

## 2021-08-27 DIAGNOSIS — J9611 Chronic respiratory failure with hypoxia: Secondary | ICD-10-CM | POA: Insufficient documentation

## 2021-08-27 DIAGNOSIS — I272 Pulmonary hypertension, unspecified: Secondary | ICD-10-CM | POA: Diagnosis not present

## 2021-08-27 DIAGNOSIS — Z7984 Long term (current) use of oral hypoglycemic drugs: Secondary | ICD-10-CM | POA: Diagnosis not present

## 2021-08-27 DIAGNOSIS — R079 Chest pain, unspecified: Secondary | ICD-10-CM | POA: Diagnosis not present

## 2021-08-27 DIAGNOSIS — I110 Hypertensive heart disease with heart failure: Secondary | ICD-10-CM | POA: Insufficient documentation

## 2021-08-27 DIAGNOSIS — G4733 Obstructive sleep apnea (adult) (pediatric): Secondary | ICD-10-CM | POA: Diagnosis not present

## 2021-08-27 DIAGNOSIS — I2721 Secondary pulmonary arterial hypertension: Secondary | ICD-10-CM | POA: Insufficient documentation

## 2021-08-27 DIAGNOSIS — Z6841 Body Mass Index (BMI) 40.0 and over, adult: Secondary | ICD-10-CM | POA: Diagnosis not present

## 2021-08-27 DIAGNOSIS — I119 Hypertensive heart disease without heart failure: Secondary | ICD-10-CM | POA: Diagnosis not present

## 2021-08-27 DIAGNOSIS — I5032 Chronic diastolic (congestive) heart failure: Secondary | ICD-10-CM | POA: Insufficient documentation

## 2021-08-27 DIAGNOSIS — I480 Paroxysmal atrial fibrillation: Secondary | ICD-10-CM | POA: Diagnosis not present

## 2021-08-27 DIAGNOSIS — Z7901 Long term (current) use of anticoagulants: Secondary | ICD-10-CM | POA: Diagnosis not present

## 2021-08-27 DIAGNOSIS — I872 Venous insufficiency (chronic) (peripheral): Secondary | ICD-10-CM

## 2021-08-27 DIAGNOSIS — I2781 Cor pulmonale (chronic): Secondary | ICD-10-CM | POA: Insufficient documentation

## 2021-08-27 DIAGNOSIS — I4892 Unspecified atrial flutter: Secondary | ICD-10-CM | POA: Diagnosis not present

## 2021-08-27 DIAGNOSIS — I2510 Atherosclerotic heart disease of native coronary artery without angina pectoris: Secondary | ICD-10-CM | POA: Diagnosis not present

## 2021-08-27 DIAGNOSIS — E1151 Type 2 diabetes mellitus with diabetic peripheral angiopathy without gangrene: Secondary | ICD-10-CM | POA: Diagnosis present

## 2021-08-27 NOTE — Assessment & Plan Note (Addendum)
Patient presents for follow-up to have Unna boots placed. She has a longstanding history of chronic venous insufficiency and difficulty applying the Unna boots herself. She was last seen 07/29 and requested to return in 1 week to have these placed. 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 No increased warmth to affected areas, no streaking or signs of infection. No signs of systemic infection. Plan:Unna boots placed today. She will need to return in 1 week to have them removed. Ultimately it would be best if she was able to successfully connect with the lymphedema clinic at [facility] to which she was referred. I have placed a referral to [facility] to help with future transportation needs.

## 2021-08-27 NOTE — Progress Notes (Signed)
Internal Medicine Clinic Attending ? ?Case discussed with Dr. Dean  At the time of the visit.  We reviewed the resident?s history and exam and pertinent patient test results.  I agree with the assessment, diagnosis, and plan of care documented in the resident?s note.  ?

## 2021-08-27 NOTE — Patient Instructions (Signed)
[name],  It was nice seeing you today! Thank you for choosing [facility] for your Primary Care.    Today we talked about:   Your leg swelling--please return to clinic in 1 week to have the Unna boots placed today removed. They can only be on for 1 week at a time.  My best, Dr. Marlou Sa

## 2021-08-27 NOTE — Progress Notes (Signed)
Orthopedic Tech Progress Note Patient Details:  [name] February 05, 1960 [id number]  Ortho Devices Type of Ortho Device: Haematologist Ortho Device/Splint Location: Bi LE Ortho Device/Splint Interventions: Application   Post Interventions Patient Tolerated: Well  Linus Salmons Anaiz Qazi 08/27/2021, 2:24 PM

## 2021-08-27 NOTE — Progress Notes (Signed)
   CC: Unna boot placement  HPI:  [name] is a 61 y.o. person with past medical history as detailed below who presents to have Unna boots placed. Please see problem based charting for detailed assessment and plan.  Past Medical History:  Diagnosis Date   Acute on chronic diastolic (congestive) heart failure ([place]) 06/25/2013   Acute on chronic respiratory failure ([place]) 06/15/2018   Anemia, iron deficiency    secondary to menhorrhagia, on oral iron, also b12 def, getting monthly b12 shots   Cellulitis 05/2019   RIGHT LOWER EXTREMITY   CHF (congestive heart failure) (HCC)    Chronic cough    secondary to alleriges and post nasal drip   Cognitive impairment    COPD (chronic obstructive pulmonary disease) (HCC)    Cor pulmonale (HCC)    PA Peak pressure 52mHg   Depression    Diabetes mellitus    well controlled on metformin   Diastolic heart failure (HCC)    GERD (gastroesophageal reflux disease)    H/O mental retardation    Herpes    Hyperlipidemia    Hypertension    OSA (obstructive sleep apnea)    CPAP   Pulmonary hypertension ([place]    Renal disorder    Restrictive lung disease    PFTs 06/2012 (FVC 54% predicted and FEV1 68% predicted w minimal bronchodilator response).   Shortness of breath    Venous stasis ulcer ([place]    chornic, ?followed up at wound care center, multiple courses of antibiotics in past for cellulitis, on lasix   Review of Systems:  Negative unless otherwise stated.  Physical Exam:  Vitals:   08/27/21 1320  BP: 124/64  Pulse: 86  SpO2: 95%  Weight: 268 lb 6.4 oz (121.7 kg)   Constitutional:Appears older than stated age. Seated comfortably in exam room chair, on oxyen. In no acute distress. Cardio:Regular rate and rhythm. No murmurs, rubs, or gallops. Pulm:Clear to auscultation bilaterally. Normal work of breathing on home oxygen via [place]. MVFI:EPPIRJJOfor extremity edema. Skin:Warm and dry. 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 Healed lesions (2) over anterior R shin. Neuro:Alert and oriented x3. No focal deficit noted. Psych:Pleasant mood and affect.  Assessment & Plan:   See Encounters Tab for problem based charting.  Chronic venous insufficiency Patient presents for follow-up to have Unna boots placed. She has a longstanding history of chronic venous insufficiency and difficulty applying the Unna boots herself. She was last seen 07/29 and requested to return in 1 week to have these placed. 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 No increased warmth to affected areas, no streaking or signs of infection. No signs of systemic infection. Plan:Unna boots placed today. She will need to return in 1 week to have them removed. Ultimately it would be best if she was able to [facility]  Patient discussed with Dr.  MCain Sieve

## 2021-08-27 NOTE — Patient Instructions (Signed)
Your physician recommends that you schedule a follow-up appointment in: 6 months (Feb 2024), **PLEASE CALL OUR OFFICE IN DECEMBER TO SCHEDULE THIS APPOINTMENT  If you have any questions or concerns before your next appointment please send [place] a message through [place] or call our office at [phone].    TO LEAVE A MESSAGE FOR THE NURSE SELECT OPTION 2, PLEASE LEAVE A MESSAGE INCLUDING: YOUR NAME DATE OF BIRTH CALL BACK NUMBER REASON FOR CALL**this is important as we prioritize the call backs  YOU WILL RECEIVE A CALL BACK THE SAME DAY AS LONG AS YOU CALL BEFORE 4:00 PM  At the [facility], you and your health needs are our priority. As part of our continuing mission to provide you with exceptional heart care, we have created designated Provider Care Teams. These Care Teams include your primary Cardiologist (physician) and Advanced Practice Providers (APPs- Physician Assistants and Nurse Practitioners) who all work together to provide you with the care you need, when you need it.   You may see any of the following providers on your designated Care Team at your next follow up: Dr Glori Bickers Dr Haynes Kerns, NP Lyda Jester, [place] [facility] [place], [place] Audry Riles, PharmD   Please be sure to bring in all your medications bottles to every appointment.

## 2021-09-03 ENCOUNTER — Encounter: Payer: Self-pay | Admitting: Internal Medicine

## 2021-09-03 ENCOUNTER — Other Ambulatory Visit: Payer: Self-pay

## 2021-09-03 ENCOUNTER — Ambulatory Visit: Payer: Medicaid Other | Admitting: Internal Medicine

## 2021-09-03 VITALS — BP 120/88 | HR 87 | Temp 98.6°F | Ht 63.0 in | Wt 273.8 lb

## 2021-09-03 DIAGNOSIS — E1142 Type 2 diabetes mellitus with diabetic polyneuropathy: Secondary | ICD-10-CM | POA: Diagnosis present

## 2021-09-03 DIAGNOSIS — I872 Venous insufficiency (chronic) (peripheral): Secondary | ICD-10-CM

## 2021-09-03 DIAGNOSIS — I50810 Right heart failure, unspecified: Secondary | ICD-10-CM

## 2021-09-03 DIAGNOSIS — Z7984 Long term (current) use of oral hypoglycemic drugs: Secondary | ICD-10-CM | POA: Diagnosis not present

## 2021-09-03 DIAGNOSIS — E1151 Type 2 diabetes mellitus with diabetic peripheral angiopathy without gangrene: Secondary | ICD-10-CM

## 2021-09-03 DIAGNOSIS — E785 Hyperlipidemia, unspecified: Secondary | ICD-10-CM

## 2021-09-03 LAB — POCT GLYCOSYLATED HEMOGLOBIN (HGB A1C): Hemoglobin A1C: 5.7 % — AB (ref 4.0–5.6)

## 2021-09-03 LAB — GLUCOSE, CAPILLARY: Glucose-Capillary: 83 mg/dL (ref 70–99)

## 2021-09-03 NOTE — Assessment & Plan Note (Signed)
Patient follows up for unna boot change of lower extremities bilaterally. She has long standing history of chronic venous insufficiency and has been referred to [facility] lymphedema clinic but has not established care there due to transportation problems. Last visit patient was referred to social work for assistance with transportation. She states that she received call from social worker but that phone disconnected and she was not able to call them back. On chart review, phone note from social work referral indicates that patient was called multiple times and when she answered phone she stated that she did not have difficulty with transportation due to her insurance helping with this. I am unsure that she understood that [facility] clinic is in [place]. We talked about clinic being about an hour away. See media for updated picture of legs. They appear less swollen. A/P: Follow-up in 1 week for unna boot change. I will clarify with [facility] staff if [facility] insurance can provide transportation to clinic hour away.

## 2021-09-03 NOTE — Assessment & Plan Note (Addendum)
Patient presents for follow-up on diabetes. Last HgbA1c completed in 5/22 and was 5.6 at that time. Current medications include metformin 250 mg qd and pravastatin 20 mg. She is adherent with medication.  A/P: Repeat HgbA1c from 5.6 to 5.7. Diabetes remains well-controlled. She was not able to give urine specimen at this visit.  On exam of her feet, she has calluses as well as signs of not being able to care for feet. No signs of erythema or warmth present, no ulcerations present.  She reports that she previously followed with a foot doctor and would be happy to go back. She is not able to reach her feet, but states that she has full sensation in them and thinks that she would now if wound occurred. -continue metformin -urine microalbumin at follow-up -referral to podiatry  Addendum 9/1:  Unable to reach patient by phone or leave vm to review A1c as this resulted after visit was completed. Will send letter.

## 2021-09-03 NOTE — Assessment & Plan Note (Addendum)
Patient follows with Dr. Haroldine Laws for RHF 2/2 to severe pulmonary hypertension. She was last seen at the heart failure clinic 8/24. Current medications include spironolactone 12.5 mg qd and torsemide 80 mg BID. She states that she frequently does not take 2nd dose of torsemide due to getting up later in the day. Her weight is up about 5 lbs from last week, 268 to 273. She does not have a scale that she can weight herself on at home. She denies worsening shortness of breath and she is able to ambulate at her baseline. On exam, no crackles present and she has normal respiratory effort.  A/P: She is not functional worse with weight gain and not signs of fluid overload, however exam limited by body habitus. Her NYHA class is consistent with class 2. We talked about importance of taking second dose of torsemide so that she would not accumulate fluid. -continue torsemide 80 mg BID and spirnolactone 12.5 mg -follow-up in 1 week, recheck weight at that time

## 2021-09-03 NOTE — Progress Notes (Signed)
Wound Care Note   Patient: [name]           Date of Birth: October 13, 1960           MRN: [id number]             PCP: Christiana Fuchs, DO Visit Date: 09/03/2021   Assessment & Plan: Visit Diagnoses:  1. Type 2 diabetes mellitus with diabetic polyneuropathy, without long-term current use of insulin ([place])   2. Hyperlipidemia, unspecified hyperlipidemia type     Plan: Bilateral unna boots applied to lower extremities. Dr. Howie Ill examined pt's skin of the BLE's and documented with photos attached below. She instructed to apply unna boots directly to skin with no additional wound care. Pt is aware of needing to f/u in 1 week to have unna boots replaced.   Follow-Up Instructions: Return in about 1 week (around 09/10/2021).  Orders:  Orders Placed This Encounter  Procedures   Lipid Profile   Glucose, capillary   POC Hbg A1C   No orders of the defined types were placed in this encounter.     Procedures:     Clinical Data: No additional findings.       Subjective: Chief Complaint  Patient presents with   Follow-up     1 wk     HPI  Review of Systems  Miscellaneous:  -Home Health Care:   -Physical Therapy:   -Out of Work?:   -Worker's Compensation Case?:   -Additional Information:    Objective: Vital Signs: BP 120/88 (BP Location: Left Arm, Patient Position: Sitting, Cuff Size: Normal)   Pulse 87   Temp 98.6 F (37 C) (Oral)   Ht _0  (1.6 m)   Wt 124.2 kg   LMP 02/19/2011   SpO2 96%   BMI 48.50 kg/m   Physical Exam:   Specialty Comments: No specialty comments available.   PMFS History: Patient Active Problem List   Diagnosis Date Noted   Chronic venous insufficiency 08/01/2021   Vomiting 03/26/2021   Knee osteoarthritis 12/12/2020   Vision changes 09/02/2020   Depression 09/02/2020   Swelling of lower extremity 08/13/2020   Left paraspinal back pain 05/05/2020   Hearing difficulty of both ears 05/05/2020   Internal hemorrhoid  01/11/2020   External hemorrhoid 01/11/2020   Fibroid, uterine 11/07/2019   Vagina bleeding 45/80/9983   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   Bleeding from the genitourinary system 09/13/2019   Gross hematuria 09/13/2019   Chronic venous stasis 05/07/2019   Urinary incontinence 12/12/2018   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[place])    Right ovarian cyst 05/19/2017   Rash 10/31/2014   Peripheral neuropathy ([place]) 09/27/2014   Healthcare maintenance 09/27/2014   Atrial flutter ([place]) 08/17/2012   Right heart failure, NYHA class 3 ([place]) 03/15/2012   Allergic rhinitis 11/16/2011   Diabetes ([place]) 10/15/2010   Hyperlipidemia 10/15/2010   Obesity 10/14/2010   Obstructive sleep apnea 02/21/2009   GERD 11/29/2005   Past Medical History:  Diagnosis Date   Acute on chronic diastolic (congestive) heart failure ([place]) 06/25/2013   Acute on chronic respiratory failure ([place]) 06/15/2018   Anemia, iron deficiency    secondary to menhorrhagia, on oral iron, also b12 def, getting monthly b12 shots   Cellulitis 05/2019   RIGHT LOWER EXTREMITY   CHF (congestive heart failure) (HCC)    Chronic cough    secondary to alleriges and post nasal drip   Cognitive impairment    COPD (chronic obstructive pulmonary disease) (  [place])    Cor pulmonale (HCC)    PA Peak pressure 63mHg   Depression    Diabetes mellitus    well controlled on metformin   Diastolic heart failure (HCC)    GERD (gastroesophageal reflux disease)    H/O mental retardation    Herpes    Hyperlipidemia    Hypertension    OSA (obstructive sleep apnea)    CPAP   Pulmonary hypertension ([place]    Renal disorder    Restrictive lung disease    PFTs 06/2012 (FVC 54% predicted and FEV1 68% predicted w minimal bronchodilator response).   Shortness of breath    Venous stasis ulcer (HCC)    chornic, ?followed up at wound care center, multiple courses of antibiotics in past for cellulitis, on lasix    Family  History  Problem Relation Age of Onset   Kidney Stones Son    Mental illness Sister    Bipolar disorder Sister    Mental retardation Brother    Hyperlipidemia Mother    Breast cancer Maternal Aunt    Colon cancer Neg Hx    Pancreatic cancer Neg Hx    Esophageal cancer Neg Hx    Past Surgical History:  Procedure Laterality Date   CARDIAC CATHETERIZATION N/A 01/13/2016   Procedure: Right Heart Cath;  Surgeon: DJolaine Artist MD;  [facility];  Service: Cardiovascular;  Laterality: N/A;   CHOLECYSTECTOMY     COLONOSCOPY WITH PROPOFOL N/A 12/20/2019   Procedure: COLONOSCOPY WITH PROPOFOL;  Surgeon: JMilus Banister MD;  [facility];  Service: Endoscopy;  Laterality: N/A;   IR FLUORO GUIDE CV LINE LEFT  11/16/2019   IR FLUORO GUIDE CV LINE LEFT  06/17/2020   IR FLUORO GUIDE CV LINE LEFT  07/04/2020   IR FLUORO GUIDE CV LINE LEFT  07/03/2021   IR FLUORO GUIDE CV LINE RIGHT  05/09/2019   IR FLUORO GUIDE CV LINE RIGHT  08/12/2019   IR REMOVAL TUN CV CATH W/O FL  05/05/2019   IR [place] ACCESS LEFT  11/16/2019   IR [place] VASC ACCESS LEFT  06/17/2020   IR [place] VASC ACCESS LEFT  07/04/2020   IR [place] VASC ACCESS LEFT  07/03/2021   IR [place] VASC ACCESS RIGHT  05/09/2019   IR [place] VASC ACCESS RIGHT  08/12/2019   IR [place] VASC ACCESS RIGHT  11/16/2019   IR VENO/JUGULAR RIGHT  11/16/2019   LEFT AND RIGHT HEART CATHETERIZATION WITH CORONARY ANGIOGRAM N/A 02/28/2014   Procedure: LEFT AND RIGHT HEART CATHETERIZATION WITH CORONARY ANGIOGRAM;  Surgeon: DJolaine Artist MD;  [facility];  Service: Cardiovascular;  Laterality: N/A;   RIGHT HEART CATH N/A 06/27/2018   Procedure: RIGHT HEART CATH;  Surgeon: BJolaine Artist MD;  [facility];  Service: Cardiovascular;  Laterality: N/A;   RIGHT/LEFT HEART CATH AND CORONARY ANGIOGRAPHY N/A 05/14/2019   Procedure: RIGHT/LEFT HEART CATH AND CORONARY ANGIOGRAPHY;  Surgeon: BJolaine Artist MD;  [facility];  Service: Cardiovascular;  Laterality: N/A;   Social History   Occupational History   Not on file  Tobacco Use   Smoking status: Never   Smokeless tobacco: Never  Vaping Use   Vaping Use: Never used  Substance and Sexual Activity   Alcohol use: No    Alcohol/week: 0.0 standard drinks of alcohol   Drug use: No   Sexual activity: Not Currently

## 2021-09-03 NOTE — Assessment & Plan Note (Addendum)
Patient with type 2 diabetes and HLD currently taking pravastatin 20 mg qd. Last lipid panel 5/22 with LDL at goal for primary prevention. Lipid Panel     Component Value Date/Time   CHOL 151 05/05/2020 1024   TRIG 124 05/05/2020 1024   HDL 43 05/05/2020 1024   CHOLHDL 3.5 05/05/2020 1024   CHOLHDL 3.5 07/22/2014 1517   VLDL 24 07/22/2014 1517   LDLCALC 86 05/05/2020 1024   LABVLDL 22 05/05/2020 1024   A/P: Repeat lipid panel  Addendum 9/1: Repeat lipid panel with LDL 73. She is at goal. No changes to medication. I was not able to reach patient by phone or leave VM. Will send letter.

## 2021-09-03 NOTE — Progress Notes (Addendum)
Subjective:  CC: chronic venous insufficiency, diabetes, hyperlipidemia  HPI:  [name] is a 61 y.o. female with a past medical history stated below and presents today for 1 week follow-up for chronic venous insufficiency, diabetes, hyperlipidemia. Please see problem based assessment and plan for additional details.  Past Medical History:  Diagnosis Date   Acute on chronic diastolic (congestive) heart failure ([place]) 06/25/2013   Acute on chronic respiratory failure ([place]) 06/15/2018   Anemia, iron deficiency    secondary to menhorrhagia, on oral iron, also b12 def, getting monthly b12 shots   Cellulitis 05/2019   RIGHT LOWER EXTREMITY   CHF (congestive heart failure) (HCC)    Chronic cough    secondary to alleriges and post nasal drip   Cognitive impairment    COPD (chronic obstructive pulmonary disease) (HCC)    Cor pulmonale (HCC)    PA Peak pressure 7mHg   Depression    Diabetes mellitus    well controlled on metformin   Diastolic heart failure (HCC)    GERD (gastroesophageal reflux disease)    H/O mental retardation    Herpes    Hyperlipidemia    Hypertension    OSA (obstructive sleep apnea)    CPAP   Pulmonary hypertension ([place]    Renal disorder    Restrictive lung disease    PFTs 06/2012 (FVC 54% predicted and FEV1 68% predicted w minimal bronchodilator response).   Shortness of breath    Venous stasis ulcer (HCC)    chornic, ?followed up at wound care center, multiple courses of antibiotics in past for cellulitis, on lasix    Current Outpatient Medications on File Prior to Visit  Medication Sig Dispense Refill   ACCU-CHEK AVIVA PLUS test strip USE 1 STRIP TO CHECK GLUCOSE ONCE DAILY (Patient not taking: Reported on 08/27/2021) 50 each 3   acetaminophen (TYLENOL) 500 MG tablet Take 1,000 mg by mouth every 6 (six) hours as needed.     carboxymethylcellulose (REFRESH PLUS) 0.5 % SOLN 1 drop 3 (three) times daily as needed.     cetirizine (ZYRTEC) 10 MG  tablet TAKE ONE TABLET BY MOUTH DAILY 90 tablet 4   cycloSPORINE (RESTASIS) 0.05 % ophthalmic emulsion Place 1 drop into both eyes 2 (two) times daily as needed (dry eyes).     diphenhydrAMINE (BENADRYL) 25 MG tablet Take 1 tablet (25 mg total) by mouth every 6 (six) hours as needed. 15 tablet 0   Ferrous Sulfate (IRON PO) Take by mouth daily.     fluticasone (FLONASE) 50 MCG/ACT nasal spray Place 1 spray into both nostrils daily as needed for allergies. 11.1 mL 1   gabapentin (NEURONTIN) 300 MG capsule TAKE TWO CAPSULES BY MOUTH THREE TIMES A DAY 180 capsule 2   metFORMIN (GLUCOPHAGE) 500 MG tablet TAKE HALF TABLET BY MOUTH DAILY WITH BREAKFAST 45 tablet 2   OXYGEN Inhale 4 L into the lungs continuous.     pantoprazole (PROTONIX) 20 MG tablet Take 1 tablet (20 mg total) by mouth 2 (two) times daily. 180 tablet 2   potassium chloride SA (KLOR-CON M) 20 MEQ tablet TAKE TWO TABLETS BY MOUTH TWICE A DAY 180 tablet 2   pravastatin (PRAVACHOL) 20 MG tablet Take 1 tablet (20 mg total) by mouth daily. 90 tablet 2   rivaroxaban (XARELTO) 20 MG TABS tablet Take 1 tablet (20 mg total) by mouth daily with supper. 90 tablet 2   sildenafil (REVATIO) 20 MG tablet TAKE 1 TABLET THREE TIMES A  DAY 90 tablet 11   spironolactone (ALDACTONE) 25 MG tablet TAKE HALF TABLET BY MOUTH DAILY 45 tablet 4   torsemide (DEMADEX) 20 MG tablet TAKE FOUR TABLETS BY MOUTH TWICE A DAY 240 tablet 5   treprostinil (REMODULIN) 5 MG/ML SOLN injection See admin instructions. As of 08/09/2019: Add 7 ml of Remodulin to cassette and 93 ml of sterile diluent for Remodulin to cassetet for a total volume of 100 ml to make a concentration of 350,000 ng per ml.  Infuse via a CADD pump intravenously at a rate of 40 ml per 24 hours. Based on a dosing weight of 120 kg, the dose is 81 ng per kg per min.  Once opened, discard Remodulin vial after 30 days.  Sterile Diluent for Remodulin vials are single use only. Once mixed, discard cassette after 48  hours.     vitamin B-12 (CYANOCOBALAMIN) 500 MCG tablet Take 1 tablet (500 mcg total) by mouth daily. 30 tablet 4   No current facility-administered medications on file prior to visit.    Family History  Problem Relation Age of Onset   Kidney Stones Son    Mental illness Sister    Bipolar disorder Sister    Mental retardation Brother    Hyperlipidemia Mother    Breast cancer Maternal Aunt    Colon cancer Neg Hx    Pancreatic cancer Neg Hx    Esophageal cancer Neg Hx     Social History   Socioeconomic History   Marital status: Single    Spouse name: Not on file   Number of children: Not on file   Years of education: Not on file   Highest education level: Not on file  Occupational History   Not on file  Tobacco Use   Smoking status: Never   Smokeless tobacco: Never  Vaping Use   Vaping Use: Never used  Substance and Sexual Activity   Alcohol use: No    Alcohol/week: 0.0 standard drinks of alcohol   Drug use: No   Sexual activity: Not Currently  Other Topics Concern   Not on file  Social History Narrative   Not on file   Social Determinants of Health   Financial Resource Strain: Low Risk  (06/21/2018)   Overall Financial Resource Strain (CARDIA)    Difficulty of Paying Living Expenses: Not very hard  Food Insecurity: No Food Insecurity (01/08/2021)   Hunger Vital Sign    Worried About Running Out of Food in the Last Year: Never true    Ran Out of Food in the Last Year: Never true  Transportation Needs: No Transportation Needs (02/24/2021)   PRAPARE - Hydrologist (Medical): No    Lack of Transportation (Non-Medical): No  Physical Activity: Insufficiently Active (04/22/2021)   Exercise Vital Sign    Days of Exercise per Week: 2 days    Minutes of Exercise per Session: 20 min  Stress: No Stress Concern Present (06/21/2018)   [place]    Feeling of Stress : Not at all   Social Connections: Socially Isolated (10/24/2020)   Social Connection and Isolation Panel [NHANES]    Frequency of Communication with Friends and Family: Once a week    Frequency of Social Gatherings with Friends and Family: Once a week    Attends Religious Services: Never    Marine scientist or Organizations: No    Attends Archivist Meetings: Never  Marital Status: Never married  Intimate Partner Violence: Not At Risk (06/21/2018)   Humiliation, Afraid, Rape, and Kick questionnaire    Fear of Current or Ex-Partner: No    Emotionally Abused: No    Physically Abused: No    Sexually Abused: No    Review of Systems: ROS negative except for what is noted on the assessment and plan.  Objective:   Vitals:   09/03/21 1359  BP: 120/88  Pulse: 87  Temp: 98.6 F (37 C)  TempSrc: Oral  SpO2: 96%  Weight: 273 lb 12.8 oz (124.2 kg)  Height: _0  (1.6 m)    Physical Exam: Constitutional: appears older than stated age, in no acute distress Cardiovascular: regular rate and rhythm, no m/r/g Pulmonary/Chest: normal work of breathing on 4L nasal canula, lungs clear to auscultation bilaterally MSK: chronic venous stasis changes to lower extremities bilaterally, minimal edema to lower extremities following removal of unna boot, feet with calluses on anterior foot and nails are untrimmed Neurological: alert & oriented x 3, antalgic gait, using walker Skin: warm and dry Psych: pleasant     Assessment & Plan:  Diabetes [facility]) Patient presents for follow-up on diabetes. Last HgbA1c completed in 5/22 and was 5.6 at that time. Current medications include metformin 250 mg qd and pravastatin 20 mg. She is adherent with medication.  A/P: Repeat HgbA1c from 5.6 to 5.7. Diabetes remains well-controlled. She was not able to give urine specimen at this visit.  On exam of her feet, she has calluses as well as signs of not being able to care for feet. No signs of erythema or warmth  present, no ulcerations present.  She reports that she previously followed with a foot doctor and would be happy to go back. She is not able to reach her feet, but states that she has full sensation in them and thinks that she would now if wound occurred. -continue metformin -urine microalbumin at follow-up -referral to podiatry  Addendum 9/1:  Unable to reach patient by phone or leave vm to review A1c as this resulted after visit was completed. Will send letter.   Right heart failure, NYHA class 3 ([place]) Patient follows with Dr. Haroldine Laws for RHF 2/2 to severe pulmonary hypertension. She was last seen at the heart failure clinic 8/24. Current medications include spironolactone 12.5 mg qd and torsemide 80 mg BID. She states that she frequently does not take 2nd dose of torsemide due to getting up later in the day. Her weight is up about 5 lbs from last week, 268 to 273. She does not have a scale that she can weight herself on at home. She denies worsening shortness of breath and she is able to ambulate at her baseline. On exam, no crackles present and she has normal respiratory effort.  A/P: She is not functional worse with weight gain and not signs of fluid overload, however exam limited by body habitus. Her NYHA class is consistent with class 2. We talked about importance of taking second dose of torsemide so that she would not accumulate fluid. -continue torsemide 80 mg BID and spirnolactone 12.5 mg -follow-up in 1 week, recheck weight at that time  Hyperlipidemia Patient with type 2 diabetes and HLD currently taking pravastatin 20 mg qd. Last lipid panel 5/22 with LDL at goal for primary prevention. Lipid Panel     Component Value Date/Time   CHOL 151 05/05/2020 1024   TRIG 124 05/05/2020 1024   HDL 43 05/05/2020 1024   CHOLHDL  3.5 05/05/2020 1024   CHOLHDL 3.5 07/22/2014 1517   VLDL 24 07/22/2014 1517   LDLCALC 86 05/05/2020 1024   LABVLDL 22 05/05/2020 1024   A/P: Repeat lipid  panel  Addendum 9/1: Repeat lipid panel with LDL 73. She is at goal. No changes to medication. I was not able to reach patient by phone or leave VM. Will send letter.  Chronic venous insufficiency Patient follows up for unna boot change of lower extremities bilaterally. She has long standing history of chronic venous insufficiency and has been referred to [facility] lymphedema clinic but has not established care there due to transportation problems. Last visit patient was referred to social work for assistance with transportation. She states that she received call from social worker but that phone disconnected and she was not able to call them back. On chart review, phone note from social work referral indicates that patient was called multiple times and when she answered phone she stated that she did not have difficulty with transportation due to her insurance helping with this. I am unsure that she understood that [facility] clinic is in [place]. We talked about clinic being about an hour away. See media for updated picture of legs. They appear less swollen. A/P: Follow-up in 1 week for unna boot change. I will clarify with [facility] staff if [facility] insurance can provide transportation to clinic hour away.    Patient discussed with Dr. Erroll Luna Pamela Maddy, D.O. [facility]  PGY-2 Pager: [phone]  Phone: [phone] Date 09/04/2021  Time 10:22 AM

## 2021-09-03 NOTE — Patient Instructions (Addendum)
Thank you, Ms.Valene A [place] for allowing [place] to provide your care today. Today we discussed:  Chronic venous insufficiency I will touch base with the social worker to see about transportation for you for the lymphedema clinic. Please come back next week to have unna boots changed.  Diabetes We are rechecking your A1c today. I will call with those results. Please keep taking metformin 250 mg for now. We will recheck a urine sample next week.  Hyperlipidemia We are rechecking cholesterol today. I will call you with those results.   Right heart failure Your weight is up a bit from last week. Please take torsemide 80 mg twice daily.  I have ordered the following labs for you:  Lab Orders         Lipid Profile         POC Hbg A1C       Referrals ordered today:   Referral Orders  No referral(s) requested today     I have ordered the following medication/changed the following medications:   Stop the following medications: There are no discontinued medications.   Start the following medications: No orders of the defined types were placed in this encounter.    Follow up:  1 week     We look forward to seeing you next time. Please call our clinic at [phone] if you have any questions or concerns. The best time to call is Monday-Friday from 9am-4pm, but there is someone available 24/7. If after hours or the weekend, call the main hospital number and ask for the Internal Medicine Resident On-Call. If you need medication refills, please notify your pharmacy one week in advance and they will send [place] a request.   Thank you for trusting me with your care. Wishing you the best!   Christiana Fuchs, [place]

## 2021-09-04 ENCOUNTER — Encounter: Payer: Self-pay | Admitting: Internal Medicine

## 2021-09-04 DIAGNOSIS — I270 Primary pulmonary hypertension: Secondary | ICD-10-CM | POA: Diagnosis not present

## 2021-09-04 DIAGNOSIS — I5030 Unspecified diastolic (congestive) heart failure: Secondary | ICD-10-CM | POA: Diagnosis not present

## 2021-09-04 DIAGNOSIS — J984 Other disorders of lung: Secondary | ICD-10-CM | POA: Diagnosis not present

## 2021-09-04 DIAGNOSIS — G4730 Sleep apnea, unspecified: Secondary | ICD-10-CM | POA: Diagnosis not present

## 2021-09-04 LAB — LIPID PANEL
Chol/HDL Ratio: 3.1 ratio (ref 0.0–4.4)
Cholesterol, Total: 141 mg/dL (ref 100–199)
HDL: 45 mg/dL (ref >39)
LDL Chol Calc (NIH): 73 mg/dL (ref 0–99)
Triglycerides: 132 mg/dL (ref 0–149)
VLDL Cholesterol Cal: 23 mg/dL (ref 5–40)

## 2021-09-08 NOTE — Progress Notes (Signed)
Internal Medicine Clinic Attending  Case discussed with Dr. Masters  At the time of the visit.  We reviewed the resident's history and exam and pertinent patient test results.  I agree with the assessment, diagnosis, and plan of care documented in the resident's note.  

## 2021-09-10 DIAGNOSIS — N3946 Mixed incontinence: Secondary | ICD-10-CM | POA: Diagnosis not present

## 2021-09-10 DIAGNOSIS — E119 Type 2 diabetes mellitus without complications: Secondary | ICD-10-CM | POA: Diagnosis not present

## 2021-09-12 ENCOUNTER — Other Ambulatory Visit: Payer: Self-pay | Admitting: Internal Medicine

## 2021-09-12 DIAGNOSIS — I5033 Acute on chronic diastolic (congestive) heart failure: Secondary | ICD-10-CM

## 2021-09-16 ENCOUNTER — Ambulatory Visit: Payer: Medicaid Other | Admitting: Student

## 2021-09-16 VITALS — BP 114/57 | HR 97 | Temp 98.0°F | Ht 63.0 in | Wt 263.4 lb

## 2021-09-16 DIAGNOSIS — I872 Venous insufficiency (chronic) (peripheral): Secondary | ICD-10-CM | POA: Diagnosis not present

## 2021-09-16 DIAGNOSIS — E1142 Type 2 diabetes mellitus with diabetic polyneuropathy: Secondary | ICD-10-CM

## 2021-09-16 DIAGNOSIS — I50810 Right heart failure, unspecified: Secondary | ICD-10-CM

## 2021-09-16 DIAGNOSIS — E1151 Type 2 diabetes mellitus with diabetic peripheral angiopathy without gangrene: Secondary | ICD-10-CM

## 2021-09-16 DIAGNOSIS — Z7984 Long term (current) use of oral hypoglycemic drugs: Secondary | ICD-10-CM | POA: Diagnosis not present

## 2021-09-16 NOTE — Assessment & Plan Note (Signed)
Well-controlled diabetes on a long-term, stable regimen of metformin.  A1c 5.7.  We will obtain a microalbumin/creatinine ratio today.  No other changes recommended to this patient's management plan. - Continue metformin 500 mg daily.

## 2021-09-16 NOTE — Progress Notes (Signed)
Subjective:  Reason for visit: Chronic venous insufficiency and lymphedema.  HPI:  [name] is a 61 y.o. female with a past medical history stated below and presents today for chronic venous insufficiency and lymphedema. Please see problem based assessment and plan for additional details.  Past Medical History:  Diagnosis Date   Acute on chronic diastolic (congestive) heart failure ([place]) 06/25/2013   Acute on chronic respiratory failure ([place]) 06/15/2018   Anemia, iron deficiency    secondary to menhorrhagia, on oral iron, also b12 def, getting monthly b12 shots   Cellulitis 05/2019   RIGHT LOWER EXTREMITY   CHF (congestive heart failure) (HCC)    Chronic cough    secondary to alleriges and post nasal drip   Cognitive impairment    COPD (chronic obstructive pulmonary disease) (HCC)    Cor pulmonale (HCC)    PA Peak pressure 45mHg   Depression    Diabetes mellitus    well controlled on metformin   Diastolic heart failure (HCC)    GERD (gastroesophageal reflux disease)    H/O mental retardation    Herpes    Hyperlipidemia    Hypertension    OSA (obstructive sleep apnea)    CPAP   [place]    Renal disorder    Restrictive lung disease    PFTs 06/2012 (FVC 54% predicted and FEV1 68% predicted w minimal bronchodilator response).   Shortness of breath    Venous stasis ulcer (HCC)    chornic, ?followed up at wound care center, multiple courses of antibiotics in past for cellulitis, on lasix    Current Outpatient Medications on File Prior to Visit  Medication Sig Dispense Refill   ACCU-CHEK AVIVA PLUS test strip USE 1 STRIP TO CHECK GLUCOSE ONCE DAILY (Patient not taking: Reported on 08/27/2021) 50 each 3   acetaminophen (TYLENOL) 500 MG tablet Take 1,000 mg by mouth every 6 (six) hours as needed.     carboxymethylcellulose (REFRESH PLUS) 0.5 % SOLN 1 drop 3 (three) times daily as needed.     cetirizine (ZYRTEC) 10 MG tablet TAKE ONE TABLET BY  MOUTH DAILY 90 tablet 4   cycloSPORINE (RESTASIS) 0.05 % ophthalmic emulsion Place 1 drop into both eyes 2 (two) times daily as needed (dry eyes).     diphenhydrAMINE (BENADRYL) 25 MG tablet Take 1 tablet (25 mg total) by mouth every 6 (six) hours as needed. 15 tablet 0   Ferrous Sulfate (IRON PO) Take by mouth daily.     fluticasone (FLONASE) 50 MCG/ACT nasal spray Place 1 spray into both nostrils daily as needed for allergies. 11.1 mL 1   gabapentin (NEURONTIN) 300 MG capsule TAKE TWO CAPSULES BY MOUTH THREE TIMES A DAY 180 capsule 2   metFORMIN (GLUCOPHAGE) 500 MG tablet TAKE HALF TABLET BY MOUTH DAILY WITH BREAKFAST 45 tablet 2   OXYGEN Inhale 4 L into the lungs continuous.     pantoprazole (PROTONIX) 20 MG tablet Take 1 tablet (20 mg total) by mouth 2 (two) times daily. 180 tablet 2   potassium chloride SA (KLOR-CON M) 20 MEQ tablet TAKE TWO TABLETS BY MOUTH TWICE A DAY 180 tablet 3   pravastatin (PRAVACHOL) 20 MG tablet Take 1 tablet (20 mg total) by mouth daily. 90 tablet 2   rivaroxaban (XARELTO) 20 MG TABS tablet Take 1 tablet (20 mg total) by mouth daily with supper. 90 tablet 2   sildenafil (REVATIO) 20 MG tablet TAKE 1 TABLET THREE TIMES A DAY  90 tablet 11   spironolactone (ALDACTONE) 25 MG tablet TAKE HALF TABLET BY MOUTH DAILY 45 tablet 4   torsemide (DEMADEX) 20 MG tablet TAKE FOUR TABLETS BY MOUTH TWICE A DAY 240 tablet 5   treprostinil (REMODULIN) 5 MG/ML SOLN injection See admin instructions. As of 08/09/2019: Add 7 ml of Remodulin to cassette and 93 ml of sterile diluent for Remodulin to cassetet for a total volume of 100 ml to make a concentration of 350,000 ng per ml.  Infuse via a CADD pump intravenously at a rate of 40 ml per 24 hours. Based on a dosing weight of 120 kg, the dose is 81 ng per kg per min.  Once opened, discard Remodulin vial after 30 days.  Sterile Diluent for Remodulin vials are single use only. Once mixed, discard cassette after 48 hours.     vitamin B-12  (CYANOCOBALAMIN) 500 MCG tablet Take 1 tablet (500 mcg total) by mouth daily. 30 tablet 4   No current facility-administered medications on file prior to visit.    Family History  Problem Relation Age of Onset   Kidney Stones Son    Mental illness Sister    Bipolar disorder Sister    Mental retardation Brother    Hyperlipidemia Mother    Breast cancer Maternal Aunt    Colon cancer Neg Hx    Pancreatic cancer Neg Hx    Esophageal cancer Neg Hx     Social History   Socioeconomic History   Marital status: Single    Spouse name: Not on file   Number of children: Not on file   Years of education: Not on file   Highest education level: Not on file  Occupational History   Not on file  Tobacco Use   Smoking status: Never   Smokeless tobacco: Never  Vaping Use   Vaping Use: Never used  Substance and Sexual Activity   Alcohol use: No    Alcohol/week: 0.0 standard drinks of alcohol   Drug use: No   Sexual activity: Not Currently  Other Topics Concern   Not on file  Social History Narrative   Not on file   Social Determinants of Health   Financial Resource Strain: Low Risk  (06/21/2018)   Overall Financial Resource Strain (CARDIA)    Difficulty of Paying Living Expenses: Not very hard  Food Insecurity: No Food Insecurity (01/08/2021)   Hunger Vital Sign    Worried About Running Out of Food in the Last Year: Never true    Ran Out of Food in the Last Year: Never true  Transportation Needs: No Transportation Needs (02/24/2021)   PRAPARE - Hydrologist (Medical): No    Lack of Transportation (Non-Medical): No  Physical Activity: Insufficiently Active (04/22/2021)   Exercise Vital Sign    Days of Exercise per Week: 2 days    Minutes of Exercise per Session: 20 min  Stress: No Stress Concern Present (06/21/2018)   [place]    Feeling of Stress : Not at all  Social Connections:  Socially Isolated (10/24/2020)   Social Connection and Isolation Panel [NHANES]    Frequency of Communication with Friends and Family: Once a week    Frequency of Social Gatherings with Friends and Family: Once a week    Attends Religious Services: Never    Marine scientist or Organizations: No    Attends Archivist Meetings: Never  Marital Status: Never married  Intimate Partner Violence: Not At Risk (06/21/2018)   Humiliation, Afraid, Rape, and Kick questionnaire    Fear of Current or Ex-Partner: No    Emotionally Abused: No    Physically Abused: No    Sexually Abused: No    Review of Systems: ROS negative except for what is noted on the assessment and plan.  Objective:   Vitals:   09/16/21 1354  BP: (!) 114/57  Pulse: 97  Temp: 98 F (36.7 C)  TempSrc: Oral  SpO2: 92%  Weight: 263 lb 6.4 oz (119.5 kg)  Height: _0  (1.6 m)    Physical Exam: General: Well-appearing female. Cardiovascular: Regular rate and rhythm.  Radial pulses 2+ bilaterally. Pulmonary: Normal work of breathing.  Lungs clear to auscultation bilaterally. Skin: Red rash around face and nose.  Scattered boils on skin.  Scattered hypopigmented macules on bilateral arms.  Coarse, thickened, hyperpigmented skin bilateral lower legs. Musculoskeletal: Bilateral lower extremity edema. Neurologic: Alert. Psychiatric: Pleasant.  Appropriate mood and affect.  Assessment & Plan:  Right heart failure, NYHA class 3 ([place]) Patient reports doing well.  No increased oxygen requirement.  No new chest pain.  Continues to take her medicines as prescribed.  Weight 263 from 273 at last visit.  This patient with right heart failure and pulmonary hypertension is stable.  I recommend no changes to her current management plan.  However she does need a more powerful oxygen concentrator for home use, as she is on a stable oxygen flow rate of 4 L/min - Continue sildenafil - Continue treprostinil - Continue  spironolactone - Continue torsemide - I ordered a 10 L/min oxygen concentrator.  Diabetes ([place]) Well-controlled diabetes on a long-term, stable regimen of metformin.  A1c 5.7.  We will obtain a microalbumin/creatinine ratio today.  No other changes recommended to this patient's management plan. - Continue metformin 500 mg daily.  Chronic venous insufficiency Patient reports decreased swelling.  Her skin is unbroken on her legs.  The Unna boots have been working well.  However they began to unravel, at which point she removed them.  This patient's lymphedema and chronic venous insufficiency is improving with application of Unna boots.  I recommend reapplication today.  The patient is amenable to this plan. - Follow-up in a week for reapplication of Unna boot.   Patient seen with Dr. Tia Alert, M.D. [facility]  PGY-1 Pager: [phone] Date 09/16/2021  Time 9:24 PM

## 2021-09-16 NOTE — Assessment & Plan Note (Signed)
Patient reports doing well.  No increased oxygen requirement.  No new chest pain.  Continues to take her medicines as prescribed.  Weight 263 from 273 at last visit.  This patient with right heart failure and pulmonary hypertension is stable.  I recommend no changes to her current management plan.  However she does need a more powerful oxygen concentrator for home use, as she is on a stable oxygen flow rate of 4 L/min - Continue sildenafil - Continue treprostinil - Continue spironolactone - Continue torsemide - I ordered a 10 L/min oxygen concentrator.

## 2021-09-16 NOTE — Patient Instructions (Signed)
Today we discussed lymphedema, diabetes, pulmonary hypertension, and heart failure.  Today we changed your [employer].  I recommend no changes to your medication regimen. You seem to be doing quite well!  We will see you in a week for an Unna boot change.    Labs and tests ordered today:  Lab Orders         Microalbumin / Creatinine Urine Ratio      Referrals ordered today:   Referral Orders  No referral(s) requested today     Take your medications as prescribed:   Allergies as of 09/16/2021       Reactions   Aspirin Swelling   REACTION: airway swelling   Codeine Other (See Comments)   REACTION: tingling in lips and hard breathing - had reaction at dentist - states "I can't take certain kinds of codeine" - happened maybe 10 yr ago   Lisinopril Cough   Sulfonamide Derivatives Swelling   REACTION: airway swelling   Latex Rash        Medication List        Accurate as of September 16, 2021  2:42 PM. If you have any questions, ask your nurse or doctor.          Accu-Chek Aviva Plus test strip Generic drug: glucose blood USE 1 STRIP TO CHECK GLUCOSE ONCE DAILY   acetaminophen 500 MG tablet Commonly known as: TYLENOL Take 1,000 mg by mouth every 6 (six) hours as needed.   carboxymethylcellulose 0.5 % Soln Commonly known as: REFRESH PLUS 1 drop 3 (three) times daily as needed.   cetirizine 10 MG tablet Commonly known as: ZYRTEC TAKE ONE TABLET BY MOUTH DAILY   cyanocobalamin 500 MCG tablet Commonly known as: VITAMIN B12 Take 1 tablet (500 mcg total) by mouth daily.   cycloSPORINE 0.05 % ophthalmic emulsion Commonly known as: RESTASIS Place 1 drop into both eyes 2 (two) times daily as needed (dry eyes).   diphenhydrAMINE 25 MG tablet Commonly known as: BENADRYL Take 1 tablet (25 mg total) by mouth every 6 (six) hours as needed.   fluticasone 50 MCG/ACT nasal spray Commonly known as: Flonase Place 1 spray into both nostrils daily as needed for  allergies.   gabapentin 300 MG capsule Commonly known as: NEURONTIN TAKE TWO CAPSULES BY MOUTH THREE TIMES A DAY   IRON PO Take by mouth daily.   metFORMIN 500 MG tablet Commonly known as: GLUCOPHAGE TAKE HALF TABLET BY MOUTH DAILY WITH BREAKFAST   OXYGEN Inhale 4 L into the lungs continuous.   pantoprazole 20 MG tablet Commonly known as: PROTONIX Take 1 tablet (20 mg total) by mouth 2 (two) times daily.   potassium chloride SA 20 MEQ tablet Commonly known as: KLOR-CON M TAKE TWO TABLETS BY MOUTH TWICE A DAY   pravastatin 20 MG tablet Commonly known as: PRAVACHOL Take 1 tablet (20 mg total) by mouth daily.   rivaroxaban 20 MG Tabs tablet Commonly known as: Xarelto Take 1 tablet (20 mg total) by mouth daily with supper.   sildenafil 20 MG tablet Commonly known as: REVATIO TAKE 1 TABLET THREE TIMES A DAY   spironolactone 25 MG tablet Commonly known as: ALDACTONE TAKE HALF TABLET BY MOUTH DAILY   torsemide 20 MG tablet Commonly known as: DEMADEX TAKE FOUR TABLETS BY MOUTH TWICE A DAY   treprostinil 5 MG/ML Soln injection Commonly known as: REMODULIN See admin instructions. As of 08/09/2019: Add 7 ml of Remodulin to cassette and 93 ml of sterile diluent  for Remodulin to cassetet for a total volume of 100 ml to make a concentration of 350,000 ng per ml.  Infuse via a CADD pump intravenously at a rate of 40 ml per 24 hours. Based on a dosing weight of 120 kg, the dose is 81 ng per kg per min.  Once opened, discard Remodulin vial after 30 days.  Sterile Diluent for Remodulin vials are single use only. Once mixed, discard cassette after 48 hours.        Follow up: 1 week  Please call our clinic at [phone] Monday through Friday from 9 am to 4 pm if you have any questions or concerns. If after hours or on the weekend, call the main hospital number and ask for the Internal Medicine Resident On-Call. If you need medication refills, please notify your pharmacy one week in  advance and they will send [place] a request.   Best, Nani Gasser, [place]

## 2021-09-16 NOTE — Assessment & Plan Note (Signed)
Patient reports decreased swelling.  Her skin is unbroken on her legs.  The Unna boots have been working well.  However they began to unravel, at which point she removed them.  This patient's lymphedema and chronic venous insufficiency is improving with application of Unna boots.  I recommend reapplication today.  The patient is amenable to this plan. - Follow-up in a week for reapplication of Unna boot.

## 2021-09-17 LAB — MICROALBUMIN / CREATININE URINE RATIO
Creatinine, Urine: 10.1 mg/dL
Microalb/Creat Ratio: <30 mg/g creat (ref 0–29)
Microalbumin, Urine: <3 ug/mL

## 2021-09-21 ENCOUNTER — Other Ambulatory Visit (HOSPITAL_COMMUNITY): Payer: Self-pay

## 2021-09-21 ENCOUNTER — Ambulatory Visit: Payer: Medicaid Other | Admitting: Podiatry

## 2021-09-21 DIAGNOSIS — M79675 Pain in left toe(s): Secondary | ICD-10-CM

## 2021-09-21 DIAGNOSIS — M79674 Pain in right toe(s): Secondary | ICD-10-CM

## 2021-09-21 DIAGNOSIS — B351 Tinea unguium: Secondary | ICD-10-CM

## 2021-09-21 DIAGNOSIS — B353 Tinea pedis: Secondary | ICD-10-CM

## 2021-09-21 DIAGNOSIS — I270 Primary pulmonary hypertension: Secondary | ICD-10-CM

## 2021-09-21 MED ORDER — CLOTRIMAZOLE-BETAMETHASONE 1-0.05 % EX CREA: 1.0000 | TOPICAL_CREAM | Freq: Two times a day (BID) | CUTANEOUS | 1 refills | Status: AC

## 2021-09-21 MED ORDER — SILDENAFIL CITRATE 20 MG PO TABS: 20.0000 mg | ORAL_TABLET | Freq: Three times a day (TID) | ORAL | 3 refills | Status: AC

## 2021-09-21 NOTE — Telephone Encounter (Signed)
Meds ordered this encounter  Medications   sildenafil (REVATIO) 20 MG tablet    Sig: Take 1 tablet (20 mg total) by mouth 3 (three) times daily.    Dispense:  270 tablet    Refill:  3

## 2021-09-21 NOTE — Progress Notes (Signed)
Chief Complaint  Patient presents with   Nail Problem    Patient states that she had fungus between her toes (bilateral) she also states that she needs her toe nails trimmed.    SUBJECTIVE Patient with a history of diabetes mellitus presents to office today complaining of elongated, thickened nails that cause pain while ambulating in shoes.  Patient is unable to trim their own nails.   Patient also states that she has been developing some dry skin with itching in between the toes and she is concerned for possible athlete's foot.  She states that she has had this in the past.  She presents for further treatment and evaluation  Patient also has weekly Unna boot applications performed by internal medicine apparently.  They are left intact today  Past Medical History:  Diagnosis Date   Acute on chronic diastolic (congestive) heart failure ([place]) 06/25/2013   Acute on chronic respiratory failure ([place]) 06/15/2018   Anemia, iron deficiency    secondary to menhorrhagia, on oral iron, also b12 def, getting monthly b12 shots   Cellulitis 05/2019   RIGHT LOWER EXTREMITY   CHF (congestive heart failure) (HCC)    Chronic cough    secondary to alleriges and post nasal drip   Cognitive impairment    COPD (chronic obstructive pulmonary disease) (HCC)    Cor pulmonale (HCC)    PA Peak pressure 39mHg   Depression    Diabetes mellitus    well controlled on metformin   Diastolic heart failure (HCC)    GERD (gastroesophageal reflux disease)    H/O mental retardation    Herpes    Hyperlipidemia    Hypertension    OSA (obstructive sleep apnea)    CPAP   Pulmonary hypertension ([place]    Renal disorder    Restrictive lung disease    PFTs 06/2012 (FVC 54% predicted and FEV1 68% predicted w minimal bronchodilator response).   Shortness of breath    Venous stasis ulcer ([place]    chornic, ?followed up at wound care center, multiple courses of antibiotics in past for cellulitis, on lasix     OBJECTIVE General Patient is awake, alert, and oriented x 3 and in no acute distress. Derm Skin is dry and supple bilateral. Negative open lesions or macerations. Remaining integument unremarkable. Nails are tender, long, thickened and dystrophic with subungual debris, consistent with onychomycosis, 1-5 bilateral. No signs of infection noted.  There is some hyperkeratotic skin with pruritus noted to the interdigital areas of the bilateral feet Vasc chronic lymphedema noted bilateral lower extremities with Unna boots intact Neuro Epicritic and protective threshold sensation diminished bilaterally.  Musculoskeletal Exam No symptomatic pedal deformities noted bilateral. Muscular strength within normal limits.  ASSESSMENT 1. Diabetes Mellitus w/ peripheral neuropathy 2.  Pain due to onychomycosis of toenails bilateral 3.  Tinea pedis bilateral feet interdigital areas  PLAN OF CARE 1. Patient evaluated today. 2. Instructed to maintain good pedal hygiene and foot care. Stressed importance of controlling blood sugar.  3. Mechanical debridement of nails 1-5 bilaterally performed using a nail nipper. Filed with dremel without incident.  4.  Prescription for Lotrisone cream apply 2 times daily  5.  Continue weekly Unna boot wraps  6.  Return to clinic as needed    BEdrick Kins DPM [facility]  Dr. BEdrick Kins DPM    [address]  [place], [place] [address]                Office [phone]  Fax [phone]

## 2021-09-22 ENCOUNTER — Other Ambulatory Visit: Payer: Self-pay | Admitting: Internal Medicine

## 2021-09-22 DIAGNOSIS — G629 Polyneuropathy, unspecified: Secondary | ICD-10-CM

## 2021-09-22 DIAGNOSIS — E7800 Pure hypercholesterolemia, unspecified: Secondary | ICD-10-CM

## 2021-09-22 NOTE — Progress Notes (Signed)
Internal Medicine Clinic Attending  Case discussed with Dr. Carin Primrose  at the time of the visit.  We reviewed the resident's history and exam and pertinent patient test results.  I agree with the assessment, diagnosis, and plan of care documented in the resident's note.

## 2021-09-24 ENCOUNTER — Encounter: Payer: Self-pay | Admitting: Student

## 2021-09-24 ENCOUNTER — Ambulatory Visit: Payer: Medicaid Other | Admitting: Student

## 2021-09-24 ENCOUNTER — Other Ambulatory Visit: Payer: Self-pay

## 2021-09-24 VITALS — BP 123/68 | HR 93 | Temp 98.1°F | Ht 63.0 in | Wt 266.4 lb

## 2021-09-24 DIAGNOSIS — I872 Venous insufficiency (chronic) (peripheral): Secondary | ICD-10-CM | POA: Diagnosis not present

## 2021-09-24 DIAGNOSIS — Z7984 Long term (current) use of oral hypoglycemic drugs: Secondary | ICD-10-CM | POA: Diagnosis not present

## 2021-09-24 DIAGNOSIS — E1151 Type 2 diabetes mellitus with diabetic peripheral angiopathy without gangrene: Secondary | ICD-10-CM | POA: Diagnosis not present

## 2021-09-24 DIAGNOSIS — Z0000 Encounter for general adult medical examination without abnormal findings: Secondary | ICD-10-CM

## 2021-09-24 DIAGNOSIS — Z23 Encounter for immunization: Secondary | ICD-10-CM | POA: Diagnosis not present

## 2021-09-24 NOTE — Assessment & Plan Note (Signed)
Unna boot changed today.  She complains of small red patch at superior margin of Unna boot on right leg.  Initially it was slightly itchy but does not bother her now.  No pain or tenderness.  No fevers or chills.  On exam there is a red patch approximately 5 x 5 cm on anterior aspect of right lower extremity about halfway up the calf.  Its not hot or tender.  Bilateral lymphedema lower legs, improving with frequent Unna boot applications.  Regarding the erythematous patch on her leg I do not suspect this is due to an infection.  Possibly some very mild irritant dermatitis from [employer] application.  I recommended that she use over-the-counter hydrocortisone 1% cream if it continues to bother her. - Return in a week for [employer] reapplication.

## 2021-09-24 NOTE — Progress Notes (Signed)
Subjective:  Reason for visit: Follow-up for Unna boot reapplication.  HPI:  [name] is a 61 y.o. female with chronic venous stasis and lymphedematous changes to bilateral lower legs who presents today for boot change. Please see problem based assessment and plan for additional details.  Past Medical History:  Diagnosis Date   Acute on chronic diastolic (congestive) heart failure ([place]) 06/25/2013   Acute on chronic respiratory failure ([place]) 06/15/2018   Anemia, iron deficiency    secondary to menhorrhagia, on oral iron, also b12 def, getting monthly b12 shots   Cellulitis 05/2019   RIGHT LOWER EXTREMITY   CHF (congestive heart failure) (HCC)    Chronic cough    secondary to alleriges and post nasal drip   Cognitive impairment    COPD (chronic obstructive pulmonary disease) (HCC)    Cor pulmonale (HCC)    PA Peak pressure 27mHg   Depression    Diabetes mellitus    well controlled on metformin   Diastolic heart failure (HCC)    GERD (gastroesophageal reflux disease)    H/O mental retardation    Herpes    Hyperlipidemia    Hypertension    OSA (obstructive sleep apnea)    CPAP   Pulmonary hypertension ([place]    Renal disorder    Restrictive lung disease    PFTs 06/2012 (FVC 54% predicted and FEV1 68% predicted w minimal bronchodilator response).   Shortness of breath    Venous stasis ulcer (HCC)    chornic, ?followed up at wound care center, multiple courses of antibiotics in past for cellulitis, on lasix    Current Outpatient Medications on File Prior to Visit  Medication Sig Dispense Refill   ACCU-CHEK AVIVA PLUS test strip USE 1 STRIP TO CHECK GLUCOSE ONCE DAILY (Patient not taking: Reported on 08/27/2021) 50 each 3   acetaminophen (TYLENOL) 500 MG tablet Take 1,000 mg by mouth every 6 (six) hours as needed.     carboxymethylcellulose (REFRESH PLUS) 0.5 % SOLN 1 drop 3 (three) times daily as needed.     cetirizine (ZYRTEC) 10 MG tablet TAKE ONE TABLET BY  MOUTH DAILY 90 tablet 4   clotrimazole-betamethasone (LOTRISONE) cream Apply 1 Application topically 2 (two) times daily. 45 g 1   cycloSPORINE (RESTASIS) 0.05 % ophthalmic emulsion Place 1 drop into both eyes 2 (two) times daily as needed (dry eyes).     diphenhydrAMINE (BENADRYL) 25 MG tablet Take 1 tablet (25 mg total) by mouth every 6 (six) hours as needed. 15 tablet 0   Ferrous Sulfate (IRON PO) Take by mouth daily.     fluticasone (FLONASE) 50 MCG/ACT nasal spray Place 1 spray into both nostrils daily as needed for allergies. 11.1 mL 1   gabapentin (NEURONTIN) 300 MG capsule TAKE TWO CAPSULES BY MOUTH THREE TIMES A DAY 180 capsule 3   metFORMIN (GLUCOPHAGE) 500 MG tablet TAKE HALF TABLET BY MOUTH DAILY WITH BREAKFAST 45 tablet 2   OXYGEN Inhale 4 L into the lungs continuous.     pantoprazole (PROTONIX) 20 MG tablet Take 1 tablet (20 mg total) by mouth 2 (two) times daily. 180 tablet 2   potassium chloride SA (KLOR-CON M) 20 MEQ tablet TAKE TWO TABLETS BY MOUTH TWICE A DAY 180 tablet 3   pravastatin (PRAVACHOL) 20 MG tablet TAKE ONE TABLET BY MOUTH DAILY 90 tablet 3   rivaroxaban (XARELTO) 20 MG TABS tablet Take 1 tablet (20 mg total) by mouth daily with supper. 90 tablet 2  sildenafil (REVATIO) 20 MG tablet Take 1 tablet (20 mg total) by mouth 3 (three) times daily. 270 tablet 3   spironolactone (ALDACTONE) 25 MG tablet TAKE HALF TABLET BY MOUTH DAILY 45 tablet 4   torsemide (DEMADEX) 20 MG tablet TAKE FOUR TABLETS BY MOUTH TWICE A DAY 240 tablet 5   treprostinil (REMODULIN) 5 MG/ML SOLN injection See admin instructions. As of 08/09/2019: Add 7 ml of Remodulin to cassette and 93 ml of sterile diluent for Remodulin to cassetet for a total volume of 100 ml to make a concentration of 350,000 ng per ml.  Infuse via a CADD pump intravenously at a rate of 40 ml per 24 hours. Based on a dosing weight of 120 kg, the dose is 81 ng per kg per min.  Once opened, discard Remodulin vial after 30 days.   Sterile Diluent for Remodulin vials are single use only. Once mixed, discard cassette after 48 hours.     vitamin B-12 (CYANOCOBALAMIN) 500 MCG tablet Take 1 tablet (500 mcg total) by mouth daily. 30 tablet 4   No current facility-administered medications on file prior to visit.    Family History  Problem Relation Age of Onset   Kidney Stones Son    Mental illness Sister    Bipolar disorder Sister    Mental retardation Brother    Hyperlipidemia Mother    Breast cancer Maternal Aunt    Colon cancer Neg Hx    Pancreatic cancer Neg Hx    Esophageal cancer Neg Hx     Social History   Socioeconomic History   Marital status: Single    Spouse name: Not on file   Number of children: Not on file   Years of education: Not on file   Highest education level: Not on file  Occupational History   Not on file  Tobacco Use   Smoking status: Never   Smokeless tobacco: Never  Vaping Use   Vaping Use: Never used  Substance and Sexual Activity   Alcohol use: No    Alcohol/week: 0.0 standard drinks of alcohol   Drug use: No   Sexual activity: Not Currently  Other Topics Concern   Not on file  Social History Narrative   Not on file   Social Determinants of Health   Financial Resource Strain: Low Risk  (06/21/2018)   Overall Financial Resource Strain (CARDIA)    Difficulty of Paying Living Expenses: Not very hard  Food Insecurity: No Food Insecurity (01/08/2021)   Hunger Vital Sign    Worried About Running Out of Food in the Last Year: Never true    Ran Out of Food in the Last Year: Never true  Transportation Needs: No Transportation Needs (02/24/2021)   PRAPARE - Hydrologist (Medical): No    Lack of Transportation (Non-Medical): No  Physical Activity: Insufficiently Active (04/22/2021)   Exercise Vital Sign    Days of Exercise per Week: 2 days    Minutes of Exercise per Session: 20 min  Stress: No Stress Concern Present (06/21/2018)   [place]    Feeling of Stress : Not at all  Social Connections: Socially Isolated (10/24/2020)   Social Connection and Isolation Panel [NHANES]    Frequency of Communication with Friends and Family: Once a week    Frequency of Social Gatherings with Friends and Family: Once a week    Attends Religious Services: Never    Active Member  of Clubs or Organizations: No    Attends Archivist Meetings: Never    Marital Status: Never married  Intimate Partner Violence: Not At Risk (06/21/2018)   Humiliation, Afraid, Rape, and Kick questionnaire    Fear of Current or Ex-Partner: No    Emotionally Abused: No    Physically Abused: No    Sexually Abused: No    Review of Systems: ROS negative except for what is noted on the assessment and plan.  Objective:   Vitals:   09/24/21 1109  BP: 123/68  Pulse: 93  Temp: 98.1 F (36.7 C)  TempSrc: Oral  SpO2: 90%  Weight: 266 lb 6.4 oz (120.8 kg)  Height: 5' 3" (1.6 m)    Physical Exam Constitutional:      General: [name] is not in acute distress.    Appearance: Normal appearance.  Cardiovascular:     Rate and Rhythm: Normal rate and regular rhythm.     Pulses: Normal pulses.  Pulmonary:     Effort: Pulmonary effort is normal.     Breath sounds: Normal breath sounds. No stridor.  Musculoskeletal:     Right lower leg: No edema.     Left lower leg: No edema.  Lymphadenopathy:     Cervical: No cervical adenopathy.  Skin:    General: Skin is warm and dry.     Findings: Erythema (Lateral right lower extremity about halfway up the calf, blanching) present.       Neurological:     Mental Status: [name] is alert. Mental status is at baseline.  Psychiatric:        Mood and Affect: Mood normal.        Behavior: Behavior normal.       Assessment & Plan:  Chronic venous insufficiency Unna boot changed today.  [name] complains of small red patch at superior margin of Unna boot on right leg.   Initially it was slightly itchy but does not bother her now.  No pain or tenderness.  No fevers or chills.  On exam there is a red patch approximately 5 x 5 cm on anterior aspect of right lower extremity about halfway up the calf.  Its not hot or tender.  Bilateral lymphedema lower legs, improving with frequent Unna boot applications.  Regarding the erythematous patch on her leg I do not suspect this is due to an infection.  Possibly some very mild irritant dermatitis from [employer] application.  I recommended that [name] use over-the-counter hydrocortisone 1% cream if it continues to bother her. - Return in a week for [employer] reapplication.  Healthcare maintenance - Flu vaccine administered today - Mammogram ordered.   No follow-ups on file.  Patient seen with Dr. Tia Alert, M.D. [facility]  PGY-1 Pager: [phone] Date 09/24/2021  Time 1:17 PM

## 2021-09-24 NOTE — Patient Instructions (Addendum)
Today we discussed lymphedema and skin redness.  We changed the Unna boots today. Your legs look like they are improving.  Your skin redness may be due to some irritation because of the Unna boots. I don't think there is an infection. You can use some hydrocortisone cream 1%, available over the counter, to sooth any itching or discomfort that you have.  Today you received the flu shot.  I have ordered a referral for a mammogram to screen for breast cancer.  Return to the clinic in 1 week for a follow-up visit to replace Unna boot.    Please call our clinic at [phone] Monday through Friday from 9 am to 4 pm if you have questions or concerns about your health. If after hours or on the weekend, call the main hospital number and ask for the Internal Medicine Resident On-Call. If you need medication refills, please notify your pharmacy one week in advance and they will send [place] a request.   Best, Nani Gasser, [place]

## 2021-09-24 NOTE — Progress Notes (Signed)
RN called and put in a verbal order for bilateral unna boots. The unna boots were applied to the pt. No issues were noted by the pt after the unna boots were applied.

## 2021-09-24 NOTE — Assessment & Plan Note (Signed)
-  Flu vaccine administered today - Mammogram ordered.

## 2021-10-01 ENCOUNTER — Encounter: Payer: Self-pay | Admitting: Internal Medicine

## 2021-10-01 ENCOUNTER — Other Ambulatory Visit: Payer: Self-pay

## 2021-10-01 ENCOUNTER — Ambulatory Visit: Payer: Medicaid Other | Admitting: Internal Medicine

## 2021-10-01 VITALS — BP 106/60 | HR 87 | Temp 98.1°F | Ht 63.0 in | Wt 273.0 lb

## 2021-10-01 DIAGNOSIS — I890 Lymphedema, not elsewhere classified: Secondary | ICD-10-CM | POA: Diagnosis not present

## 2021-10-01 DIAGNOSIS — I872 Venous insufficiency (chronic) (peripheral): Secondary | ICD-10-CM | POA: Diagnosis not present

## 2021-10-01 NOTE — Progress Notes (Signed)
RN called and placed a verbal order for bilateral unna boots. The unna boots were applied to BLE and were adjusted as needed. Care of the unna boots were provided.

## 2021-10-01 NOTE — Progress Notes (Signed)
Internal Medicine Clinic Attending  Case discussed with Dr. McLendon  at the time of the visit.  We reviewed the resident's history and exam and pertinent patient test results.  I agree with the assessment, diagnosis, and plan of care documented in the resident's note.  

## 2021-10-01 NOTE — Patient Instructions (Addendum)
Dear [name],  Thank you for trusting [place] with your care today. We changed your Unna boots today. Please return in 1 week to have your Unna boots changed once more.  I have ordered some compression stockings for you to wear if you have to remove your Unna boots.

## 2021-10-01 NOTE — Progress Notes (Signed)
CC: 1 wk recheck  HPI:[name] is a 61 y.o. female who presents for evaluation of unna boot application. Please see individual problem based A/P for details.  Patient had not had unna boot on for 2 days prior to visit. She was unable to make it to the restroom in time and soiled boots. Is tolerating boot well. Leg swelling improved with boot and ambulation. Has not noticed any weight gain/worsening of swelling.   Depression, PHQ-9: Based on the patients  [place] Visit from 10/01/2021 in [place]  PHQ-9 Total Score 0      score we have .  Past Medical History:  Diagnosis Date   Acute on chronic diastolic (congestive) heart failure ([place]) 06/25/2013   Acute on chronic respiratory failure ([place]) 06/15/2018   Anemia, iron deficiency    secondary to menhorrhagia, on oral iron, also b12 def, getting monthly b12 shots   Cellulitis 05/2019   RIGHT LOWER EXTREMITY   CHF (congestive heart failure) (HCC)    Chronic cough    secondary to alleriges and post nasal drip   Cognitive impairment    COPD (chronic obstructive pulmonary disease) (HCC)    Cor pulmonale (HCC)    PA Peak pressure 53mHg   Depression    Diabetes mellitus    well controlled on metformin   Diastolic heart failure (HCC)    GERD (gastroesophageal reflux disease)    H/O mental retardation    Herpes    Hyperlipidemia    Hypertension    OSA (obstructive sleep apnea)    CPAP   Pulmonary hypertension ([facility]    Renal disorder    Restrictive lung disease    PFTs 06/2012 (FVC 54% predicted and FEV1 68% predicted w minimal bronchodilator response).   Shortness of breath    Ven[place]    chornic, ?followed up at wound care center, multiple courses of antibiotics in past for cellulitis, on lasix   Review of Systems:   Review of Systems  Respiratory:  Negative for cough and shortness of breath.   Cardiovascular:  Negative for chest pain and palpitations.      Physical Exam: Vitals:   10/01/21 1404  BP: 106/60  Pulse: 87  Temp: 98.1 F (36.7 C)  TempSrc: Oral  SpO2: 92%  Weight: 273 lb (123.8 kg)  Height: 5' 3" (1.6 m)     General: NAD HEENT: Conjunctiva nl , antiicteric sclerae, moist mucous membranes, no exudate or erythema Cardiovascular: Normal rate, regular rhythm.  No murmurs, rubs, or gallops Pulmonary : Equal breath sounds, No wheezes, rales, or rhonchi. On supplemental O2 Ext: Chronic venous stasis changes. No open wounds.   Assessment & Plan:   See Encounters Tab for problem based charting.  Patient without open wounds, likely would benefit from compression stocking as opposed to indefinite weekly reapplication of unna boots. Will plan to complete 1 month of unna boot application. Compression stockings also ordered in the event she has to prematurely remove boots again.  - 1 more unna boot application - compression stockings  Patient discussed with Dr. VEvette Doffing

## 2021-10-03 ENCOUNTER — Encounter: Payer: Self-pay | Admitting: Internal Medicine

## 2021-10-03 NOTE — Assessment & Plan Note (Addendum)
Patient had not had unna boot on for 2 days prior to visit. She was unable to make it to the restroom in time and soiled boots. Is tolerating boot well. Leg swelling improved with boot and ambulation. Has not noticed any weight gain/worsening of swelling.   Evidence of chronic LEE. No open wounds at this time.  Patient without open wounds, likely would benefit from compression stocking as opposed to indefinite weekly reapplication of unna boots. Will plan to complete 1 month of unna boot application. Compression stockings also ordered in the event she has to prematurely remove boots again.  - 1 more unna boot application - compression stockings

## 2021-10-04 DIAGNOSIS — I270 Primary pulmonary hypertension: Secondary | ICD-10-CM | POA: Diagnosis not present

## 2021-10-04 DIAGNOSIS — J984 Other disorders of lung: Secondary | ICD-10-CM | POA: Diagnosis not present

## 2021-10-04 DIAGNOSIS — I5030 Unspecified diastolic (congestive) heart failure: Secondary | ICD-10-CM | POA: Diagnosis not present

## 2021-10-04 DIAGNOSIS — G4730 Sleep apnea, unspecified: Secondary | ICD-10-CM | POA: Diagnosis not present

## 2021-10-05 NOTE — Progress Notes (Signed)
Internal Medicine Clinic Attending ° °Case discussed with Dr. Gawaluck  At the time of the visit.  We reviewed the resident’s history and exam and pertinent patient test results.  I agree with the assessment, diagnosis, and plan of care documented in the resident’s note.  °

## 2021-10-08 ENCOUNTER — Other Ambulatory Visit: Payer: Self-pay | Admitting: Internal Medicine

## 2021-10-08 ENCOUNTER — Encounter: Payer: Self-pay | Admitting: Internal Medicine

## 2021-10-08 ENCOUNTER — Ambulatory Visit: Payer: Medicaid Other | Admitting: Internal Medicine

## 2021-10-08 VITALS — BP 152/64 | HR 85 | Temp 97.7°F | Ht 63.0 in | Wt 264.0 lb

## 2021-10-08 DIAGNOSIS — L988 Other specified disorders of the skin and subcutaneous tissue: Secondary | ICD-10-CM | POA: Diagnosis not present

## 2021-10-08 DIAGNOSIS — I50810 Right heart failure, unspecified: Secondary | ICD-10-CM

## 2021-10-08 DIAGNOSIS — I872 Venous insufficiency (chronic) (peripheral): Secondary | ICD-10-CM | POA: Diagnosis not present

## 2021-10-08 MED ORDER — FLUCONAZOLE POWD
1.0000 | Freq: Every day | 0 refills | Status: AC
Start: 2021-10-08 — End: ?

## 2021-10-08 NOTE — Progress Notes (Signed)
   CC: Unna boot exchange  HPI:[name] is a 61 y.o. female who presents for evaluation of unna boot exchange. Please see individual problem based A/P for details.  Patient was able to keep unna boots on the entire week. Feels they have been working.   Patient complains of rash in groin. Says that she thinks it is a flare of prior genital herpes. Describes red bumps, not tender. No discharge and no bleeding noted. Does report possible new odor. No new encounters. Does report that she has been itching her groin.   Depression, PHQ-9: Based on the patients  [place] Visit from 10/08/2021 in [place]  PHQ-9 Total Score 0      score we have .  Past Medical History:  Diagnosis Date   Acute on chronic diastolic (congestive) heart failure ([place]) 06/25/2013   Acute on chronic respiratory failure ([place]) 06/15/2018   Anemia, iron deficiency    secondary to menhorrhagia, on oral iron, also b12 def, getting monthly b12 shots   Cellulitis 05/2019   RIGHT LOWER EXTREMITY   CHF (congestive heart failure) (HCC)    Chronic cough    secondary to alleriges and post nasal drip   Cognitive impairment    COPD (chronic obstructive pulmonary disease) (HCC)    Cor pulmonale (HCC)    PA Peak pressure 71mHg   Depression    Diabetes mellitus    well controlled on metformin   Diastolic heart failure (HCC)    GERD (gastroesophageal reflux disease)    H/O mental retardation    Herpes    Hyperlipidemia    Hypertension    OSA (obstructive sleep apnea)    CPAP   Pulmonary hypertension ([place]    Renal disorder    Restrictive lung disease    PFTs 06/2012 (FVC 54% predicted and FEV1 68% predicted w minimal bronchodilator response).   Shortness of breath    Venous stasis ulcer ([facility]    chornic, ?followed up at wound care center, multiple courses of antibiotics in past for cellulitis, on lasix   Review of Systems:   See HPI   Physical Exam: Vitals:    10/08/21 1533  BP: (!) 152/64  Pulse: 85  Temp: 97.7 F (36.5 C)  TempSrc: Oral  SpO2: 94%  Weight: 264 lb (119.7 kg)  Height: _0  (1.6 m)   General: nad HEENT: Conjunctiva nl , antiicteric sclerae, moist mucous membranes, no exudate or erythema Cardiovascular: Normal rate, regular rhythm.  No murmurs, rubs, or gallops Pulmonary : Equal breath sounds, No wheezes, rales, or rhonchi Abdominal: soft, nontender,  bowel sounds present GU: On exam, there is maceration of skin within skin folds. No obvious yeast, discharge, or odor. No papules noted. Ext: No edema in lower extremities, no tenderness to palpation of lower extremities.   Assessment & Plan:   See Encounters Tab for problem based charting.  Patient would likely benefit from hh aide to change her unna boots. She is limited in mobility due to oxygen requirement. Not able to tolerate stockings.  - HH  Area reported is more concerning for maceration of skin from moisture within skin folds. No obvious candida infection noted. Findings not consistent with herpes flare. Discussed keeping area dry and free of moisture. Given her itching, she may benefit form topical diflucan powder.  - fluconazole powder  Patient discussed with Dr. NDareen Piano

## 2021-10-08 NOTE — Progress Notes (Signed)
Consult Note   Patient: [name]             Date of Birth: 06-19-1960           MRN: [id number]             Visit Date: 10/08/2021  Requested by: Masters, [place], DO [address] [place],  [place] [address] PCP: Christiana Fuchs, DO  Thank you for asking me to evaluate this patient for Follow-up ([name])  Below are my findings.     Assessment & Plan: Visit Diagnoses:  1. Chronic venous insufficiency   2. Right heart failure, NYHA class 3 (HCC)     Plan: Unna boots reapplied to bilateral lower extremities per request by Dr. Howie Ill.   Follow Up Instructions: Return in about 1 week (around 10/15/2021), or if symptoms worsen or fail to improve.  Orders: Orders Placed This Encounter  Procedures   Home Health   Face-to-face encounter (required for Medicare/Medicaid patients)   Meds ordered this encounter  Medications   Fluconazole POWD    Sig: 1 Application by Does not apply route daily.    Dispense:  1000 g    Refill:  0     Procedures: Unna boots- applied to BLE. Pt had no complaints of any areas of tightness or discomfort once Unna boots were placed. Extra tape was added at the proximal aspect of her tibia for extra reinforcement as she noted the coban had started to unravel slightly in the past couple of days.   Subjective:  Chief Complaint  Patient presents with   Follow-up    PATIENT HERE FOR UNNA BOOT     Objective: Vital Signs: BP (!) 152/64 (BP Location: Right Arm, Patient Position: Sitting, Cuff Size: Small)   Pulse 85   Temp 97.7 F (36.5 C) (Oral)   Ht 5' 3" (1.6 m)   Wt 119.7 kg   LMP 02/19/2011   SpO2 94% Comment: patient on 4 liters of oxygen  BMI 46.77 kg/m    Specialty Comments:  No specialty comments available.  Imaging:  No results found.   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   Bleeding from the genitourinary system 09/13/2019   Gross hematuria 09/13/2019   Urinary incontinence 12/12/2018   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[place])    Right ovarian cyst 05/19/2017   Rash 10/31/2014   Peripheral neuropathy ([place]) 09/27/2014   Healthcare maintenance 09/27/2014   Atrial flutter ([place]) 08/17/2012   Right heart failure, NYHA class 3 ([place]) 03/15/2012   Allergic rhinitis 11/16/2011   Diabetes ([place]) 10/15/2010   Hyperlipidemia 10/15/2010   Obesity 10/14/2010   Obstructive sleep apnea 02/21/2009   GERD 11/29/2005   Past Medical History:  Diagnosis Date   Acute on chronic diastolic (congestive) heart failure ([place]) 06/25/2013   Acute on chronic respiratory failure ([place]) 06/15/2018   Anemia, iron deficiency    secondary to menhorrhagia, on oral iron, also b12 def, getting monthly b12 shots   Cellulitis 05/2019  RIGHT LOWER EXTREMITY   CHF (congestive heart failure) (HCC)    Chronic cough    secondary to alleriges and post nasal drip   Cognitive impairment    COPD (chronic obstructive pulmonary disease) (HCC)    Cor pulmonale (HCC)    PA Peak pressure 57mHg   Depression    Diabetes mellitus    well controlled on metformin   Diastolic heart failure (HCC)    GERD (gastroesophageal reflux disease)    H/O mental retardation    Herpes    Hyperlipidemia    Hypertension    OSA (obstructive sleep apnea)    CPAP   Pulmonary hypertension (HCC)    Renal disorder    Restrictive lung disease    PFTs 06/2012 (FVC 54% predicted and FEV1 68% predicted w minimal bronchodilator response).    Shortness of breath    Venous stasis ulcer (HCC)    chornic, ?followed up at wound care center, multiple courses of antibiotics in past for cellulitis, on lasix    Family History  Problem Relation Age of Onset   Kidney Stones Son    Mental illness Sister    Bipolar disorder Sister    Mental retardation Brother    Hyperlipidemia Mother    Breast cancer Maternal Aunt    Colon cancer Neg Hx    Pancreatic cancer Neg Hx    Esophageal cancer Neg Hx    Past Surgical History:  Procedure Laterality Date   CARDIAC CATHETERIZATION N/A 01/13/2016   Procedure: Right Heart Cath;  Surgeon: DJolaine Artist MD;  [facility];  Service: Cardiovascular;  Laterality: N/A;   CHOLECYSTECTOMY     COLONOSCOPY WITH PROPOFOL N/A 12/20/2019   Procedure: COLONOSCOPY WITH PROPOFOL;  Surgeon: JMilus Banister MD;  [facility];  Service: Endoscopy;  Laterality: N/A;   IR FLUORO GUIDE CV LINE LEFT  11/16/2019   IR FLUORO GUIDE CV LINE LEFT  06/17/2020   IR FLUORO GUIDE CV LINE LEFT  07/04/2020   IR FLUORO GUIDE CV LINE LEFT  07/03/2021   IR FLUORO GUIDE CV LINE RIGHT  05/09/2019   IR FLUORO GUIDE CV LINE RIGHT  08/12/2019   IR REMOVAL TUN CV CATH W/O FL  05/05/2019   IR [place]ASC ACCESS LEFT  11/16/2019   IR [place] VASC ACCESS LEFT  06/17/2020   IR [place] VASC ACCESS LEFT  07/04/2020   IR [place] VASC ACCESS LEFT  07/03/2021   IR [place] VASC ACCESS RIGHT  05/09/2019   IR [place] VASC ACCESS RIGHT  08/12/2019   IR [place] VASC ACCESS RIGHT  11/16/2019   IR VENO/JUGULAR RIGHT  11/16/2019   LEFT AND RIGHT HEART CATHETERIZATION WITH CORONARY ANGIOGRAM N/A 02/28/2014   Procedure: LEFT AND RIGHT HEART CATHETERIZATION WITH CORONARY ANGIOGRAM;  Surgeon: DJolaine Artist MD;  [facility];  Service: Cardiovascular;  Laterality: N/A;   RIGHT HEART CATH N/A 06/27/2018   Procedure: RIGHT HEART CATH;  Surgeon: BJolaine Artist MD;  [facility];  Service:  Cardiovascular;  Laterality: N/A;   RIGHT/LEFT HEART CATH AND CORONARY ANGIOGRAPHY N/A 05/14/2019   Procedure: RIGHT/LEFT HEART CATH AND CORONARY ANGIOGRAPHY;  Surgeon: BJolaine Artist MD;  [facility];  Service: Cardiovascular;  Laterality: N/A;   Social History   Occupational History   Not on file  Tobacco Use   Smoking status: Never   Smokeless tobacco: Never  Vaping Use  Vaping Use: Never used  Substance and Sexual Activity   Alcohol use: No    Alcohol/week: 0.0 standard drinks of alcohol   Drug use: No   Sexual activity: Not Currently

## 2021-10-08 NOTE — Patient Instructions (Signed)
Dear Mrs. [name],  Thank you for trusting [place] with your care today.   We would like to see you back in 1 week to exchange your unna boots again. We have sent in an order to home health to hopefully get you some assistance at home with changing the unna boots.   The irritation in your groin appears to be related to moisture. This can allow for yeast infections. I have written for a topical medication to be applied to the groin/skin creases. Please keep your skin dry to help as well.   Return in 1 week for unna boot exchange.

## 2021-10-10 ENCOUNTER — Encounter: Payer: Self-pay | Admitting: Internal Medicine

## 2021-10-10 DIAGNOSIS — L988 Other specified disorders of the skin and subcutaneous tissue: Secondary | ICD-10-CM | POA: Insufficient documentation

## 2021-10-10 NOTE — Assessment & Plan Note (Addendum)
Patient was able to keep unna boots on the entire week. Feels they have been working.   Leg swelling does appear to have improved compared to last visit. No open wounds.  Patient would likely benefit from hh aide to change her unna boots. She is limited in mobility due to oxygen requirement. Not able to tolerate stockings.  - HH

## 2021-10-10 NOTE — Assessment & Plan Note (Signed)
Patient complains of rash in groin. Says that she thinks it is a flare of prior genital herpes. Describes red bumps, not tender. No discharge and no bleeding noted. Does report possible new odor. No new encounters. Does report that she has been itching her groin.   On exam, there is maceration of skin within skin folds. No obvious yeast, discharge, or odor. No papules noted.   Area reported is more concerning for maceration of skin from moisture within skin folds. No obvious candida infection noted. Findings not consistent with herpes flare. Discussed keeping area dry and free of moisture. Given her itching, she may benefit form topical diflucan powder.  - fluconazole powder

## 2021-10-12 DIAGNOSIS — E119 Type 2 diabetes mellitus without complications: Secondary | ICD-10-CM | POA: Diagnosis not present

## 2021-10-12 DIAGNOSIS — N3946 Mixed incontinence: Secondary | ICD-10-CM | POA: Diagnosis not present

## 2021-10-12 NOTE — Progress Notes (Signed)
Internal Medicine Clinic Attending ° °Case discussed with Dr. Gawaluck  At the time of the visit.  We reviewed the resident’s history and exam and pertinent patient test results.  I agree with the assessment, diagnosis, and plan of care documented in the resident’s note.  °

## 2021-10-13 DIAGNOSIS — R0609 Other forms of dyspnea: Secondary | ICD-10-CM | POA: Diagnosis not present

## 2021-10-13 DIAGNOSIS — J9611 Chronic respiratory failure with hypoxia: Secondary | ICD-10-CM | POA: Diagnosis not present

## 2021-10-13 DIAGNOSIS — I5032 Chronic diastolic (congestive) heart failure: Secondary | ICD-10-CM | POA: Diagnosis not present

## 2021-10-13 DIAGNOSIS — G4733 Obstructive sleep apnea (adult) (pediatric): Secondary | ICD-10-CM | POA: Diagnosis not present

## 2021-10-13 DIAGNOSIS — Z7901 Long term (current) use of anticoagulants: Secondary | ICD-10-CM | POA: Diagnosis not present

## 2021-10-13 DIAGNOSIS — Z79899 Other long term (current) drug therapy: Secondary | ICD-10-CM | POA: Diagnosis not present

## 2021-10-13 DIAGNOSIS — I272 Pulmonary hypertension, unspecified: Secondary | ICD-10-CM | POA: Diagnosis not present

## 2021-10-15 ENCOUNTER — Ambulatory Visit: Payer: Medicaid Other | Admitting: Internal Medicine

## 2021-10-15 VITALS — BP 136/68 | HR 81 | Wt 270.1 lb

## 2021-10-15 DIAGNOSIS — I872 Venous insufficiency (chronic) (peripheral): Secondary | ICD-10-CM | POA: Diagnosis not present

## 2021-10-15 DIAGNOSIS — E1151 Type 2 diabetes mellitus with diabetic peripheral angiopathy without gangrene: Secondary | ICD-10-CM

## 2021-10-15 DIAGNOSIS — Z7984 Long term (current) use of oral hypoglycemic drugs: Secondary | ICD-10-CM

## 2021-10-15 NOTE — Progress Notes (Signed)
CC: Unna boot replacement  HPI:  [name] is a 61 y.o. person with past medical history as detailed below who presents today for replacement of current Unna boots. Please see problem based charting for detailed assessment and plan.  Past Medical History:  Diagnosis Date   Acute on chronic diastolic (congestive) heart failure ([place]) 06/25/2013   Acute on chronic respiratory failure ([place]) 06/15/2018   Anemia, iron deficiency    secondary to menhorrhagia, on oral iron, also b12 def, getting monthly b12 shots   Cellulitis 05/2019   RIGHT LOWER EXTREMITY   CHF (congestive heart failure) (HCC)    Chronic cough    secondary to alleriges and post nasal drip   Cognitive impairment    COPD (chronic obstructive pulmonary disease) (HCC)    Cor pulmonale (HCC)    PA Peak pressure 43mHg   Depression    Diabetes mellitus    well controlled on metformin   Diastolic heart failure (HCC)    GERD (gastroesophageal reflux disease)    H/O mental retardation    Herpes    Hyperlipidemia    Hypertension    OSA (obstructive sleep apnea)    CPAP   Pulmonary hypertension ([place]    Renal disorder    Restrictive lung disease    PFTs 06/2012 (FVC 54% predicted and FEV1 68% predicted w minimal bronchodilator response).   Shortness of breath    Venous stasis ulcer ([place]    chornic, ?followed up at wound care center, multiple courses of antibiotics in past for cellulitis, on lasix   Review of Systems:  Negative unless otherwise stated.  Physical Exam:  Vitals:   10/15/21 1538 10/15/21 1606  BP: (!) 145/105 136/68  Pulse: 86 81  SpO2: 97%   Weight: 270 lb 1.6 oz (122.5 kg)    Constitutional:Appears comfortable, reading in exam room chair, on home oxygen. In no acute distress. Cardio:Regular rate and rhythm. No murmurs, rubs, or gallops. PT and DP pulses normal bilaterally. Pulm:Clear to auscultation bilaterally. Normal work of breathing on room air. MNGE:XBMWUXLKfor extremity  edema. Skin:Warm and dry. Venous stasis changes to bilateral lower extremities without open wounds or sores. No weeping of the skin noted. Neuro:Alert and oriented x3. No focal deficit noted. Psych:Pleasant mood and affect.   Assessment & Plan:   See Encounters Tab for problem based charting.  Chronic venous insufficiency Patient presents today for removal of old and application of new Unna boots. She now has transportation which greatly broadens her access to lymphedema clinics for chronic management. She is also requesting a prescription for Juxtafit Compression Wraps. Plan:Unna boots reapplied today. Referral placed for lymphedema clinic. DME order placed for Juxtafit Compression Wraps. Patient is aware that her Unna boots have to be replaced weekly.  Patient discussed with Dr. GPhilipp Ovens

## 2021-10-15 NOTE — Progress Notes (Signed)
Consult Note   Patient: [name]             Date of Birth: 05/07/1960           MRN: [id number]             Visit Date: 10/15/2021  Requested by: Masters, [place], DO [address] [place],  [place] [address] PCP: Christiana Fuchs, DO   Assessment & Plan: Visit Diagnoses:  1. Chronic venous insufficiency     Plan: Maintain bilateral unna boots applied today for 1 week.  Follow Up Instructions: Follow up in 1 week for removal and reapplication of unna boots if deemed necessary by the Internal Medicine physician.  Orders: Orders Placed This Encounter  Procedures   For home use only DME Other see comment   Ambulatory referral to Physical Therapy   No orders of the defined types were placed in this encounter.    Procedures: UNNA BOOTS- applied to bilateral lower extremities without complication.     Subjective:  Chief Complaint  Patient presents with   Follow-up    F/u bilateral lower ext edema      Objective: Vital Signs: BP 136/68 (BP Location: Right Arm, Patient Position: Sitting, Cuff Size: Small)   Pulse 81   Wt 122.5 kg Comment: pulm/htn pum 2lbs  LMP 02/19/2011   SpO2 97%   BMI 47.85 kg/m    Imaging: N/A   PMFS History: Patient Active Problem List   Diagnosis Date Noted   Maceration of skin 10/10/2021   Chronic venous insufficiency 08/01/2021   Vomiting 03/26/2021   Knee osteoarthritis 12/12/2020   Vision changes 09/02/2020   Depression 09/02/2020   Swelling of lower extremity 08/13/2020   Left paraspinal back pain 05/05/2020   Hearing difficulty of both ears 05/05/2020   Internal hemorrhoid 01/11/2020   External hemorrhoid 01/11/2020   Fibroid, uterine 11/07/2019   Vagina bleeding 92/17/8375   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   Bleeding from the genitourinary system 09/13/2019   Gross hematuria 09/13/2019   Urinary incontinence 12/12/2018   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[place])    Right  ovarian cyst 05/19/2017   Rash 10/31/2014   Peripheral neuropathy ([place]) 09/27/2014   Healthcare maintenance 09/27/2014   Atrial flutter ([place]) 08/17/2012   Right heart failure, NYHA class 3 ([place]) 03/15/2012   Allergic rhinitis 11/16/2011   Diabetes ([place]) 10/15/2010   Hyperlipidemia 10/15/2010   Obesity 10/14/2010   Obstructive sleep apnea 02/21/2009   GERD 11/29/2005   Past Medical History:  Diagnosis Date   Acute on chronic diastolic (congestive) heart failure ([place]) 06/25/2013   Acute on chronic respiratory failure ([place]) 06/15/2018   Anemia, iron deficiency    secondary to menhorrhagia, on oral iron, also b12 def, getting monthly b12 shots   Cellulitis 05/2019   RIGHT LOWER EXTREMITY   CHF (congestive heart failure) (HCC)    Chronic cough    secondary to alleriges and post nasal drip   Cognitive impairment    COPD (chronic obstructive pulmonary disease) (HCC)    Cor pulmonale (HCC)    PA Peak pressure 102mHg   Depression    Diabetes mellitus    well controlled on metformin   Diastolic heart failure (HCC)    GERD (gastroesophageal reflux disease)    H/O mental retardation    Herpes    Hyperlipidemia    Hypertension    OSA (obstructive sleep apnea)    CPAP   Pulmonary hypertension ([place]  Renal disorder    Restrictive lung disease    PFTs 06/2012 (FVC 54% predicted and FEV1 68% predicted w minimal bronchodilator response).   Shortness of breath    Venous stasis ulcer (HCC)    chornic, ?followed up at wound care center, multiple courses of antibiotics in past for cellulitis, on lasix    Family History  Problem Relation Age of Onset   Kidney Stones Son    Mental illness Sister    Bipolar disorder Sister    Mental retardation Brother    Hyperlipidemia Mother    Breast cancer Maternal Aunt    Colon cancer Neg Hx    Pancreatic cancer Neg Hx    Esophageal cancer Neg Hx    Past Surgical History:  Procedure Laterality Date   CARDIAC CATHETERIZATION N/A 01/13/2016    Procedure: Right Heart Cath;  Surgeon: Jolaine Artist, MD;  Location: [facility];  Service: Cardiovascular;  Laterality: N/A;   CHOLECYSTECTOMY     COLONOSCOPY WITH PROPOFOL N/A 12/20/2019   Procedure: COLONOSCOPY WITH PROPOFOL;  Surgeon: [name], MD;  [facility];  Service: Endoscopy;  Laterality: N/A;   IR FLUORO GUIDE CV LINE LEFT  11/16/2019   IR FLUORO GUIDE CV LINE LEFT  06/17/2020   IR FLUORO GUIDE CV LINE LEFT  07/04/2020   IR FLUORO GUIDE CV LINE LEFT  07/03/2021   IR FLUORO GUIDE CV LINE RIGHT  05/09/2019   IR FLUORO GUIDE CV LINE RIGHT  08/12/2019   IR REMOVAL TUN CV CATH W/O FL  05/05/2019   IR [place] GUIDE VASC ACCESS LEFT  11/16/2019   IR US GUIDE VASC ACCESS LEFT  06/17/2020   IR US GUIDE VASC ACCESS LEFT  07/04/2020   IR US GUIDE VASC ACCESS LEFT  07/03/2021   IR US GUIDE VASC ACCESS RIGHT  05/09/2019   IR US GUIDE VASC ACCESS RIGHT  08/12/2019   IR US GUIDE VASC ACCESS RIGHT  11/16/2019   IR VENO/JUGULAR RIGHT  11/16/2019   LEFT AND RIGHT HEART CATHETERIZATION WITH CORONARY ANGIOGRAM N/A 02/28/2014   Procedure: LEFT AND RIGHT HEART CATHETERIZATION WITH CORONARY ANGIOGRAM;  Surgeon: Jolaine Artist, MD;  Location: [facility];  Service: Cardiovascular;  Laterality: N/A;   RIGHT HEART CATH N/A 06/27/2018   Procedure: RIGHT HEART CATH;  Surgeon: Jolaine Artist, MD;  Location: [place] [facility];  Service: Cardiovascular;  Laterality: N/A;   RIGHT/LEFT HEART CATH AND CORONARY ANGIOGRAPHY N/A 05/14/2019   Procedure: RIGHT/LEFT HEART CATH AND CORONARY ANGIOGRAPHY;  Surgeon: Jolaine Artist, MD;  Location: [place] [facility];  Service: Cardiovascular;  Laterality: N/A;   Social History   Occupational History   Not on file  Tobacco Use   Smoking status: Never   Smokeless tobacco: Never  Vaping Use   Vaping Use: Never used  Substance and Sexual Activity   Alcohol use: No    Alcohol/week: 0.0 standard drinks of alcohol   Drug use: No   Sexual activity:  Not Currently

## 2021-10-15 NOTE — Assessment & Plan Note (Signed)
Patient presents today for removal of old and application of new Unna boots. She now has transportation which greatly broadens her access to lymphedema clinics for chronic management. She is also requesting a prescription for Juxtafit Compression Wraps. Plan:Unna boots reapplied today. Referral placed for lymphedema clinic. DME order placed for Juxtafit Compression Wraps. Patient is aware that her Unna boots have to be replaced weekly.

## 2021-10-15 NOTE — Patient Instructions (Signed)
Ms. Bacorn,  It was a pleasure to care for you today!  Please return in 1 week to have the Unna boots placed today removed. I am sending a referral for a lymphedema clinic for you and placing an order for the Juxtafit compression wraps.  My best, Dr. Marlou Sa

## 2021-10-16 NOTE — Progress Notes (Signed)
Internal Medicine Clinic Attending ? ?Case discussed with Dr. Dean  At the time of the visit.  We reviewed the resident?s history and exam and pertinent patient test results.  I agree with the assessment, diagnosis, and plan of care documented in the resident?s note.  ?

## 2021-10-19 ENCOUNTER — Telehealth: Payer: Self-pay

## 2021-10-19 ENCOUNTER — Other Ambulatory Visit: Payer: Self-pay | Admitting: Internal Medicine

## 2021-10-19 DIAGNOSIS — E538 Deficiency of other specified B group vitamins: Secondary | ICD-10-CM

## 2021-10-19 NOTE — Telephone Encounter (Signed)
Patient called she is requesting a medication/ topical cream to be sent in for the rash she has on her legs.

## 2021-10-20 NOTE — Telephone Encounter (Signed)
I will need to assess the rash at her visit tomorrow before I am able to send something in. Thank you!

## 2021-10-22 ENCOUNTER — Ambulatory Visit (INDEPENDENT_AMBULATORY_CARE_PROVIDER_SITE_OTHER): Payer: Medicaid Other | Admitting: Internal Medicine

## 2021-10-22 DIAGNOSIS — I872 Venous insufficiency (chronic) (peripheral): Secondary | ICD-10-CM | POA: Diagnosis present

## 2021-10-22 NOTE — Progress Notes (Signed)
Consult Note   Patient: [name]             Date of Birth: [birth year]           MRN: [id number]             Visit Date: 10/22/2021  Requested by: Masters, [place], DO [address] [place],  [place] [address] PCP: Christiana Fuchs, DO    Assessment & Plan: Visit Diagnoses:  1. Chronic venous insufficiency     Plan: Follow-up in 1 week for removal of unna boots and possible reapplication.   Orders: Apply Unna Boots-Bilateral   Procedures: Unna boots applied to BLE.    Clinical Data:  Subjective:     Objective: Vital Signs: BP 106/69 (BP Location: Left Arm, Patient Position: Sitting, Cuff Size: Normal)   Pulse 92   Wt 123.1 kg   LMP 02/19/2011   SpO2 95% Comment: on 4L  BMI 48.06 kg/m   Physical Exam   Ortho Exam   Specialty Comments:  No specialty comments available.  Imaging:  No results found.   PMFS History: Patient Active Problem List   Diagnosis Date Noted   Maceration of skin 10/10/2021   Chronic venous insufficiency 08/01/2021   Vomiting 03/26/2021   Knee osteoarthritis 12/12/2020   Vision changes 09/02/2020   Depression 09/02/2020   Swelling of lower extremity 08/13/2020   Left paraspinal back pain 05/05/2020   Hearing difficulty of both ears 05/05/2020   Internal hemorrhoid 01/11/2020   External hemorrhoid 01/11/2020   Fibroid, uterine 11/07/2019   Vagina bleeding 29/93/7169   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   Bleeding from the genitourinary system 09/13/2019   Gross hematuria 09/13/2019   Urinary incontinence 12/12/2018   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)    Right ovarian cyst 05/19/2017   Rash 10/31/2014   Peripheral neuropathy ([place]) 09/27/2014   Healthcare maintenance 09/27/2014   Atrial flutter ([place]) 08/17/2012   Right heart failure, NYHA class 3 ([place]) 03/15/2012   Allergic rhinitis 11/16/2011   Diabetes ([place]) 10/15/2010   Hyperlipidemia 10/15/2010   Obesity 10/14/2010    Obstructive sleep apnea 02/21/2009   GERD 11/29/2005   Past Medical History:  Diagnosis Date   Acute on chronic diastolic (congestive) heart failure ([place]) 06/25/2013   Acute on chronic respiratory failure ([place]) 06/15/2018   Anemia, iron deficiency    secondary to menhorrhagia, on oral iron, also b12 def, getting monthly b12 shots   Cellulitis 05/2019   RIGHT LOWER EXTREMITY   CHF (congestive heart failure) (HCC)    Chronic cough    secondary to alleriges and post nasal drip   Cognitive impairment    COPD (chronic obstructive pulmonary disease) (HCC)    Cor pulmonale (HCC)    PA Peak pressure 40mHg   Depression    Diabetes mellitus    well controlled on metformin   Diastolic heart failure (HCC)    GERD (gastroesophageal reflux disease)    H/O mental retardation    Herpes    Hyperlipidemia    Hypertension    OSA (obstructive sleep apnea)    CPAP   Pulmonary hypertension ([place]    Renal disorder    Restrictive lung disease    PFTs 06/2012 (FVC 54% predicted and FEV1 68% predicted w minimal bronchodilator response).   Shortness of breath    Venous stasis ulcer ([place]    chornic, ?followed up at wound care center, multiple courses of antibiotics in past for cellulitis, on lasix  Family History  Problem Relation Age of Onset   Kidney Stones Son    Mental illness Sister    Bipolar disorder Sister    Mental retardation Brother    Hyperlipidemia Mother    Breast cancer Maternal Aunt    Colon cancer Neg Hx    Pancreatic cancer Neg Hx    Esophageal cancer Neg Hx    Past Surgical History:  Procedure Laterality Date   CARDIAC CATHETERIZATION N/A 01/13/2016   Procedure: Right Heart Cath;  Surgeon: Jolaine Artist, MD;  Location: [place] [facility];  Service: Cardiovascular;  Laterality: N/A;   CHOLECYSTECTOMY     COLONOSCOPY WITH PROPOFOL N/A 12/20/2019   Procedure: COLONOSCOPY WITH PROPOFOL;  Surgeon: [name], MD;  [facility];  Service: Endoscopy;   Laterality: N/A;   IR FLUORO GUIDE CV LINE LEFT  11/16/2019   IR FLUORO GUIDE CV LINE LEFT  06/17/2020   IR FLUORO GUIDE CV LINE LEFT  07/04/2020   IR FLUORO GUIDE CV LINE LEFT  07/03/2021   IR FLUORO GUIDE CV LINE RIGHT  05/09/2019   IR FLUORO GUIDE CV LINE RIGHT  08/12/2019   IR REMOVAL TUN CV CATH W/O FL  05/05/2019   IR US GUIDE VASC ACCESS LEFT  11/16/2019   IR US GUIDE VASC ACCESS LEFT  06/17/2020   IR US GUIDE VASC ACCESS LEFT  07/04/2020   IR US GUIDE VASC ACCESS LEFT  07/03/2021   IR US GUIDE VASC ACCESS RIGHT  05/09/2019   IR US GUIDE VASC ACCESS RIGHT  08/12/2019   IR US GUIDE VASC ACCESS RIGHT  11/16/2019   IR VENO/JUGULAR RIGHT  11/16/2019   LEFT AND RIGHT HEART CATHETERIZATION WITH CORONARY ANGIOGRAM N/A 02/28/2014   Procedure: LEFT AND RIGHT HEART CATHETERIZATION WITH CORONARY ANGIOGRAM;  Surgeon: Jolaine Artist, MD;  Location: [facility] CATH LAB;  Service: Cardiovascular;  Laterality: N/A;   RIGHT HEART CATH N/A 06/27/2018   Procedure: RIGHT HEART CATH;  Surgeon: Jolaine Artist, MD;  Location: [place] [facility] LAB;  Service: Cardiovascular;  Laterality: N/A;   RIGHT/LEFT HEART CATH AND CORONARY ANGIOGRAPHY N/A 05/14/2019   Procedure: RIGHT/LEFT HEART CATH AND CORONARY ANGIOGRAPHY;  Surgeon: Jolaine Artist, MD;  Location: [place] [facility];  Service: Cardiovascular;  Laterality: N/A;   Social History   Occupational History   Not on file  Tobacco Use   Smoking status: Never   Smokeless tobacco: Never  Vaping Use   Vaping Use: Never used  Substance and Sexual Activity   Alcohol use: No    Alcohol/week: 0.0 standard drinks of alcohol   Drug use: No   Sexual activity: Not Currently

## 2021-10-22 NOTE — Progress Notes (Signed)
CC: Unna boot replacement, DME order confirmation  HPI:  [name] is a 61 y.o. person with past medical history as detailed below who presents today for replacement of current Unna boots and to confirm the correct DME order was placed for long term LE wraps. Please see problem based charting for detailed assessment and plan.  Past Medical History:  Diagnosis Date   Acute on chronic diastolic (congestive) heart failure ([place]) 06/25/2013   Acute on chronic respiratory failure ([place]) 06/15/2018   Anemia, iron deficiency    secondary to menhorrhagia, on oral iron, also b12 def, getting monthly b12 shots   Cellulitis 05/2019   RIGHT LOWER EXTREMITY   CHF (congestive heart failure) (HCC)    Chronic cough    secondary to alleriges and post nasal drip   Cognitive impairment    COPD (chronic obstructive pulmonary disease) (HCC)    Cor pulmonale (HCC)    PA Peak pressure 28mHg   Depression    Diabetes mellitus    well controlled on metformin   Diastolic heart failure (HCC)    GERD (gastroesophageal reflux disease)    H/O mental retardation    Herpes    Hyperlipidemia    Hypertension    OSA (obstructive sleep apnea)    CPAP   Pulmonary hypertension ([place]    Renal disorder    Restrictive lung disease    PFTs 06/2012 (FVC 54% predicted and FEV1 68% predicted w minimal bronchodilator response).   Shortness of breath    Ven[place] st[place]    chornic, ?followed up at wound care center, multiple courses of antibiotics in past for cellulitis, on lasix   Review of Systems:  Negative unless otherwise stated.  Physical Exam:  Vitals:   10/22/21 1050  BP: 106/69  Pulse: 92  SpO2: 95%  Weight: 271 lb 4.8 oz (123.1 kg)   Constitutional:Appears comfortable, on home oxygen. In no acute distress. Cardio:Regular rate and rhythm. No murmurs, rubs, or gallops.  Pulm:Normal work of breathing on nasal cannula. MDUK:GURKYHCWfor pitting edema to bilateral lower  extremities. Skin:Warm and dry. Venous stasis changes to bilateral lower extremities without open wounds or sores. No weeping of the skin noted. Neuro:Alert and oriented x3. No focal deficit noted. Psych:Pleasant mood and affect.  Assessment & Plan:   See Encounters Tab for problem based charting.  Chronic venous insufficiency Patient presents today for removal of old and application of new Unna boots. Since last visit we have learned that one of three local lymphedema clinics is not an option for her but our team is working on referrals for the other clinics for long-term care. She has not yet received the requested Juxtafit Compression Wraps and has a copy of a prior prescription for these to compare to the order placed last week. She did have to remove her Unna boots before this visit due to having an accident and her son wrapped both of her legs with an ace bandage in the meantime.  Plan:Unna boots reapplied today. I have confirmed that the DME order placed last week has the correct information and our team will reach out to them today. I am hopeful that she can obtain these in the next few days, and that she will be able to use these long term rather than weekly clinic unna boot changes. She is hopeful for this, too. Will continue to follow her referral process for long-term care as well. Patient is aware that her Unna boots have to be  removed in one week.  Patient discussed with Dr. Philipp Ovens

## 2021-10-22 NOTE — Addendum Note (Signed)
Addended by: Renato Battles on: 10/22/2021 11:03 AM   Modules accepted: Orders

## 2021-10-22 NOTE — Patient Instructions (Signed)
[name],  It was a pleasure to care for you today.  We will follow up on the order for your Juxtafit Compression Wraps. I am hopeful that you can get those in the next few days, and that we can transition to you using those!  Remember, we need to take the Unna boots off in 1 week.  My best, Dr. Marlou Sa

## 2021-10-22 NOTE — Assessment & Plan Note (Signed)
Patient presents today for removal of old and application of new Unna boots. Since last visit we have learned that one of three local lymphedema clinics is not an option for her but our team is working on referrals for the other clinics for long-term care. She has not yet received the requested Juxtafit Compression Wraps and has a copy of a prior prescription for these to compare to the order placed last week. She did have to remove her Unna boots before this visit due to having an accident and her son wrapped both of her legs with an ace bandage in the meantime.  Plan:Unna boots reapplied today. I have confirmed that the DME order placed last week has the correct information and our team will reach out to them today. I am hopeful that she can obtain these in the next few days, and that she will be able to use these long term rather than weekly clinic unna boot changes. She is hopeful for this, too. Will continue to follow her referral process for long-term care as well. Patient is aware that her Unna boots have to be removed in one week.

## 2021-10-22 NOTE — Progress Notes (Signed)
Internal Medicine Clinic Attending ? ?Case discussed with Dr. Dean  At the time of the visit.  We reviewed the resident?s history and exam and pertinent patient test results.  I agree with the assessment, diagnosis, and plan of care documented in the resident?s note.  ?

## 2021-10-29 ENCOUNTER — Ambulatory Visit (INDEPENDENT_AMBULATORY_CARE_PROVIDER_SITE_OTHER): Payer: Medicaid Other | Admitting: Student

## 2021-10-29 ENCOUNTER — Encounter: Payer: Self-pay | Admitting: Student

## 2021-10-29 ENCOUNTER — Other Ambulatory Visit: Payer: Self-pay

## 2021-10-29 VITALS — BP 119/59 | HR 88 | Temp 97.7°F | Ht 63.0 in | Wt 272.9 lb

## 2021-10-29 DIAGNOSIS — K766 Portal hypertension: Secondary | ICD-10-CM

## 2021-10-29 DIAGNOSIS — E1151 Type 2 diabetes mellitus with diabetic peripheral angiopathy without gangrene: Secondary | ICD-10-CM | POA: Diagnosis not present

## 2021-10-29 DIAGNOSIS — I872 Venous insufficiency (chronic) (peripheral): Secondary | ICD-10-CM

## 2021-10-29 DIAGNOSIS — G4733 Obstructive sleep apnea (adult) (pediatric): Secondary | ICD-10-CM | POA: Diagnosis not present

## 2021-10-29 DIAGNOSIS — G629 Polyneuropathy, unspecified: Secondary | ICD-10-CM

## 2021-10-29 DIAGNOSIS — I4892 Unspecified atrial flutter: Secondary | ICD-10-CM

## 2021-10-29 DIAGNOSIS — E1142 Type 2 diabetes mellitus with diabetic polyneuropathy: Secondary | ICD-10-CM

## 2021-10-29 DIAGNOSIS — I50810 Right heart failure, unspecified: Secondary | ICD-10-CM

## 2021-10-29 DIAGNOSIS — Z794 Long term (current) use of insulin: Secondary | ICD-10-CM | POA: Diagnosis not present

## 2021-10-29 DIAGNOSIS — I2721 Secondary pulmonary arterial hypertension: Secondary | ICD-10-CM | POA: Diagnosis not present

## 2021-10-29 DIAGNOSIS — Z7984 Long term (current) use of oral hypoglycemic drugs: Secondary | ICD-10-CM

## 2021-10-29 NOTE — Patient Instructions (Signed)
Ms. Ehler,  It was a pleasure seeing you in the clinic today.   We have changed your UNNA boots and applied new ones today. I have placed a referral to our social work team to help determine if you are eligible for Medicare. They will be in contact with you. Please call the number given to schedule an appointment at the [facility]. Please come back to see [place] in 1 week for new UNNA boot changes while we are waiting for the lymphedema clinic appointment.  Please call our clinic at [phone] if you have any questions or concerns. The best time to call is Monday-Friday from 9am-4pm, but there is someone available 24/7 at the same number. If you need medication refills, please notify your pharmacy one week in advance and they will send [place] a request.   Thank you for letting us take part in your care. We look forward to seeing you next time!

## 2021-10-29 NOTE — Progress Notes (Signed)
CC: f/u chronic venous insufficiency  HPI:  [name] is a 61 y.o. female with history listed below presenting to the [facility] for f/u of chronic venous insufficiency (replacement of UNNA boots). Please see individualized problem based charting for full HPI.  Past Medical History:  Diagnosis Date   Acute on chronic diastolic (congestive) heart failure ([place]) 06/25/2013   Acute on chronic respiratory failure ([place]) 06/15/2018   Anemia, iron deficiency    secondary to menhorrhagia, on oral iron, also b12 def, getting monthly b12 shots   Cellulitis 05/2019   RIGHT LOWER EXTREMITY   CHF (congestive heart failure) (HCC)    Chronic cough    secondary to alleriges and post nasal drip   Cognitive impairment    COPD (chronic obstructive pulmonary disease) (HCC)    Cor pulmonale (HCC)    PA Peak pressure 83mHg   Depression    Diabetes mellitus    well controlled on metformin   Diastolic heart failure (HCC)    GERD (gastroesophageal reflux disease)    H/O mental retardation    Herpes    Hyperlipidemia    Hypertension    OSA (obstructive sleep apnea)    CPAP   Pulmonary hypertension ([place]    Renal disorder    Restrictive lung disease    PFTs 06/2012 (FVC 54% predicted and FEV1 68% predicted w minimal bronchodilator response).   Shortness of breath    Venous stasis ulcer ([place]    chornic, ?followed up at wound care center, multiple courses of antibiotics in past for cellulitis, on lasix    Review of Systems:  Negative aside from that listed in individualized problem based charting.  Physical Exam:  Vitals:   10/29/21 0941  BP: (!) 119/59  Pulse: 88  Temp: 97.7 F (36.5 C)  TempSrc: Oral  SpO2: 94%  Weight: 272 lb 14.4 oz (123.8 kg)  Height: 5' 3" (1.6 m)   Physical Exam Constitutional:      General: She is not in acute distress.    Appearance: She is obese.     Comments: Chronically ill-appearing  HENT:     Mouth/Throat:     Mouth: Mucous membranes are moist.      Pharynx: Oropharynx is clear.  Eyes:     Extraocular Movements: Extraocular movements intact.     Conjunctiva/sclera: Conjunctivae normal.     Pupils: Pupils are equal, round, and reactive to light.  Cardiovascular:     Rate and Rhythm: Normal rate and regular rhythm.     Pulses: Normal pulses.     Heart sounds: Normal heart sounds. No murmur heard.    No gallop.  Pulmonary:     Effort: Pulmonary effort is normal.     Breath sounds: Normal breath sounds. No wheezing, rhonchi or rales.     Comments: Saturating 94% on chronic 4L [place] Abdominal:     General: Bowel sounds are normal. There is no distension.     Palpations: Abdomen is soft.     Tenderness: There is no abdominal tenderness.  Musculoskeletal:        General: Normal range of motion.     Comments: 2-3+ edema in bilateral LE, chronic.  Skin:    General: Skin is warm and dry.     Comments: Chronic venous stasis changes.  Neurological:     General: No focal deficit present.     Mental Status: She is alert. Mental status is at baseline.  Psychiatric:  Mood and Affect: Mood normal.        Behavior: Behavior normal.      Assessment & Plan:   See Encounters Tab for problem based charting.  Patient discussed with Dr.  Saverio Danker

## 2021-10-29 NOTE — Progress Notes (Signed)
Consult Note   Patient: [name]             Date of Birth: 12/02/1960           MRN: [id number]             Visit Date: 10/29/2021  Requested by: Masters, Virgie, DO [address] [place],   [id number] PCP: Christiana Fuchs, DO   Assessment & Plan: Visit Diagnoses:  1. Right heart failure, NYHA class 3 ([place])   2. Atrial flutter, unspecified type ([place])   3. 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 Obstructive sleep apnea   5. Chronic venous insufficiency   6. Type 2 diabetes mellitus with diabetic polyneuropathy, without long-term current use of insulin ([place])   7. Peripheral polyneuropathy     Plan: Applied bilateral unna boots today 10/29/2021.   Follow Up Instructions: Return in about 1 week (around 11/05/2021) for [facility] boot changes.  Orders: Orders Placed This Encounter  Procedures   AMB Referral to [facility]   No orders of the defined types were placed in this encounter.    Procedures: Bilateral unna boots   Clinical Data: No additional findings.   Subjective:  Chief Complaint  Patient presents with   Follow-up        Objective: Vital Signs: BP (!) 119/59 (BP Location: Left Arm, Patient Position: Sitting, Cuff Size: Large)   Pulse 88   Temp 97.7 F (36.5 C) (Oral)   Ht _0  (1.6 m)   Wt 123.8 kg   LMP 02/19/2011   SpO2 94%   BMI 48.34 kg/m     Specialty Comments:  No specialty comments available.  Imaging:  No results found.   PMFS History: Patient Active Problem List   Diagnosis Date Noted   Maceration of skin 10/10/2021   Chronic venous insufficiency 08/01/2021   Vomiting 03/26/2021   Knee osteoarthritis 12/12/2020   Vision changes 09/02/2020   Depression 09/02/2020   Swelling of lower extremity 08/13/2020   Left paraspinal back pain 05/05/2020   Hearing difficulty of both ears 05/05/2020   Internal hemorrhoid 01/11/2020   External hemorrhoid 01/11/2020    Fibroid, uterine 11/07/2019   Vagina bleeding 86/57/8469   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   Bleeding from the genitourinary system 09/13/2019   Gross hematuria 09/13/2019   Urinary incontinence 12/12/2018   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[place])    Right ovarian cyst 05/19/2017   Rash 10/31/2014   Peripheral neuropathy ([place]) 09/27/2014   Healthcare maintenance 09/27/2014   Atrial flutter ([place]) 08/17/2012   Right heart failure, NYHA class 3 ([place]) 03/15/2012   Allergic rhinitis 11/16/2011   Diabetes ([place]) 10/15/2010   Hyperlipidemia 10/15/2010   Obesity 10/14/2010   Obstructive sleep apnea 02/21/2009   GERD 11/29/2005   Past Medical History:  Diagnosis Date   Acute on chronic diastolic (congestive) heart failure ([place]) 06/25/2013   Acute on chronic respiratory failure ([place]) 06/15/2018   Anemia, iron deficiency    secondary to menhorrhagia, on oral iron, also b12 def, getting monthly b12 shots   Cellulitis 05/2019   RIGHT LOWER EXTREMITY   CHF (congestive heart failure) (HCC)    Chronic cough    secondary to alleriges and post nasal drip   Cognitive impairment    COPD (chronic obstructive pulmonary disease) (HCC)    Cor pulmonale (HCC)    PA Peak pressure 19mHg   Depression    Diabetes mellitus  well controlled on metformin   Diastolic heart failure (HCC)    GERD (gastroesophageal reflux disease)    H/O mental retardation    Herpes    Hyperlipidemia    Hypertension    OSA (obstructive sleep apnea)    CPAP   Pulmonary hypertension (HCC)    Renal disorder    Restrictive lung disease    PFTs 06/2012 (FVC 54% predicted and FEV1 68% predicted w minimal bronchodilator response).   Shortness of breath    Venous stasis ulcer (HCC)    chornic, ?followed up at wound care center, multiple courses of antibiotics in past for cellulitis, on lasix    Family History  Problem Relation Age of Onset   Kidney Stones Son    Mental illness Sister     Bipolar disorder Sister    Mental retardation Brother    Hyperlipidemia Mother    Breast cancer Maternal Aunt    Colon cancer Neg Hx    Pancreatic cancer Neg Hx    Esophageal cancer Neg Hx    Past Surgical History:  Procedure Laterality Date   CARDIAC CATHETERIZATION N/A 01/13/2016   Procedure: Right Heart Cath;  Surgeon: Jolaine Artist, MD;  Location: [facility];  Service: Cardiovascular;  Laterality: N/A;   CHOLECYSTECTOMY     COLONOSCOPY WITH PROPOFOL N/A 12/20/2019   Procedure: COLONOSCOPY WITH PROPOFOL;  Surgeon: [name], MD;  Location: [facility];  Service: Endoscopy;  Laterality: N/A;   IR FLUORO GUIDE CV LINE LEFT  11/16/2019   IR FLUORO GUIDE CV LINE LEFT  06/17/2020   IR FLUORO GUIDE CV LINE LEFT  07/04/2020   IR FLUORO GUIDE CV LINE LEFT  07/03/2021   IR FLUORO GUIDE CV LINE RIGHT  05/09/2019   IR FLUORO GUIDE CV LINE RIGHT  08/12/2019   IR REMOVAL TUN CV CATH W/O FL  05/05/2019   IR US GUIDE VASC ACCESS LEFT  11/16/2019   IR US GUIDE VASC ACCESS LEFT  06/17/2020   IR US GUIDE VASC ACCESS LEFT  07/04/2020   IR US GUIDE VASC ACCESS LEFT  07/03/2021   IR US GUIDE VASC ACCESS RIGHT  05/09/2019   IR US GUIDE VASC ACCESS RIGHT  08/12/2019   IR US GUIDE VASC ACCESS RIGHT  11/16/2019   IR VENO/JUGULAR RIGHT  11/16/2019   LEFT AND RIGHT HEART CATHETERIZATION WITH CORONARY ANGIOGRAM N/A 02/28/2014   Procedure: LEFT AND RIGHT HEART CATHETERIZATION WITH CORONARY ANGIOGRAM;  Surgeon: Jolaine Artist, MD;  Location: [facility];  Service: Cardiovascular;  Laterality: N/A;   RIGHT HEART CATH N/A 06/27/2018   Procedure: RIGHT HEART CATH;  Surgeon: Jolaine Artist, MD;  Location: [facility] [place] [facility];  Service: Cardiovascular;  Laterality: N/A;   RIGHT/LEFT HEART CATH AND CORONARY ANGIOGRAPHY N/A 05/14/2019   Procedure: RIGHT/LEFT HEART CATH AND CORONARY ANGIOGRAPHY;  Surgeon: Jolaine Artist, MD;  Location: [facility];  Service: Cardiovascular;  Laterality: N/A;    Social History   Occupational History   Not on file  Tobacco Use   Smoking status: Never   Smokeless tobacco: Never  Vaping Use   Vaping Use: Never used  Substance and Sexual Activity   Alcohol use: No    Alcohol/week: 0.0 standard drinks of alcohol   Drug use: No   Sexual activity: Not Currently

## 2021-10-29 NOTE — Assessment & Plan Note (Signed)
Patient with chronic venous insufficiency, has been presenting to [facility] weekly for [facility] boot placement to help with lymphedema. She is still waiting to receive the requested Juxtafit Compression Wraps, but states she is going to give them a call today to ensure it has been ordered. We have placed a referral to the [facility] to help with chronic management of her symptoms. She was previously unable to go due to lack of transportation, but she now has transportation and thus should qualify for care at that location. Provided patient with their phone number and advised to call them to schedule an appointment. Her lower extremities appear stable in size with some mild improvement. Chronic healing wounds and venous stasis changes noted bilaterally. Will remove old UNNA boots and replace today. Will continue weekly follow ups for UNNA boot changes until established in lymphedema clinic.  Plan: -waiting on Juxtafit Compression Wraps -replaced UNNA boots today -given number to call [facility] to schedule appointment -f/u in 1 week

## 2021-10-30 NOTE — Progress Notes (Signed)
Internal Medicine Clinic Attending  Case discussed with Dr. Allyson Sabal  At the time of the visit.  We reviewed the resident's history and exam and pertinent patient test results.  I agree with the assessment, diagnosis, and plan of care documented in the resident's note.

## 2021-11-04 DIAGNOSIS — J984 Other disorders of lung: Secondary | ICD-10-CM | POA: Diagnosis not present

## 2021-11-04 DIAGNOSIS — I270 Primary pulmonary hypertension: Secondary | ICD-10-CM | POA: Diagnosis not present

## 2021-11-04 DIAGNOSIS — I5030 Unspecified diastolic (congestive) heart failure: Secondary | ICD-10-CM | POA: Diagnosis not present

## 2021-11-04 DIAGNOSIS — G4730 Sleep apnea, unspecified: Secondary | ICD-10-CM | POA: Diagnosis not present

## 2021-11-05 ENCOUNTER — Encounter: Payer: Self-pay | Admitting: Internal Medicine

## 2021-11-05 ENCOUNTER — Ambulatory Visit: Payer: Medicaid Other | Admitting: Internal Medicine

## 2021-11-05 VITALS — BP 103/57 | HR 83 | Temp 98.2°F | Wt 275.7 lb

## 2021-11-05 DIAGNOSIS — I872 Venous insufficiency (chronic) (peripheral): Secondary | ICD-10-CM

## 2021-11-05 DIAGNOSIS — I2721 Secondary pulmonary arterial hypertension: Secondary | ICD-10-CM | POA: Diagnosis not present

## 2021-11-05 DIAGNOSIS — G4733 Obstructive sleep apnea (adult) (pediatric): Secondary | ICD-10-CM

## 2021-11-05 DIAGNOSIS — K766 Portal hypertension: Secondary | ICD-10-CM | POA: Diagnosis not present

## 2021-11-05 NOTE — Patient Instructions (Signed)
Thank you, [name] for allowing [place] to provide your care today.   Leg swelling We will continue Unna boots 1 time weekly until you can go yo clinic in [place]. Your appointment is on November 30th in [place]. Be sure to follow-up there.  Oxygen I have completed the paperwork to replace both home concentrator and portable. Your new company for oxygen will now be [employer].  Referrals ordered today:   Referral Orders  No referral(s) requested today     I have ordered the following medication/changed the following medications:   Stop the following medications: There are no discontinued medications.   Start the following medications: No orders of the defined types were placed in this encounter.    Follow up:  1 week    We look forward to seeing you next time. Please call our clinic at [phone] if you have any questions or concerns. The best time to call is Monday-Friday from 9am-4pm, but there is someone available 24/7. If after hours or the weekend, call the main hospital number and ask for the Internal Medicine Resident On-Call. If you need medication refills, please notify your pharmacy one week in advance and they will send [place] a request.   Thank you for trusting me with your care. Wishing you the best!   [name], [place]

## 2021-11-05 NOTE — Progress Notes (Signed)
   Procedure Note  Patient: [name]             Date of Birth: December 17, 1960           MRN: [id number]             Visit Date: 11/05/2021  Procedures: Visit Diagnoses: Please refer to past medical history in Dr. Howie Ill' note from today @ 1046.   Unna boots applied to BLE today without complication. Pt is aware of needing to f/u in 1 week for removal and possible reapplication.

## 2021-11-05 NOTE — Progress Notes (Signed)
SATURATION QUALIFICATIONS: (This note is used to comply with regulatory documentation for home oxygen)  Patient Saturations on Room Air at Rest = 89%  Patient Saturations on Room Air while Ambulating = 83%  Patient Saturations on 4 Liters of oxygen while Ambulating = 93%  Please briefly explain why patient needs home oxygen: Needs POC for better mobility.

## 2021-11-05 NOTE — Progress Notes (Signed)
Subjective:  CC: unna boot change and oxygen concentrator difficulties/ CPAP  HPI:  [name] is a 61 y.o. female with a past medical history stated below and presents today for weekly una boot change. She also has had difficulties with carrying oxygen, home concentrator, and her CPAP. Please see problem based assessment and plan for additional details.  Past Medical History:  Diagnosis Date   Acute on chronic diastolic (congestive) heart failure ([place]) 06/25/2013   Acute on chronic respiratory failure ([place]) 06/15/2018   Anemia, iron deficiency    secondary to menhorrhagia, on oral iron, also b12 def, getting monthly b12 shots   Cellulitis 05/2019   RIGHT LOWER EXTREMITY   CHF (congestive heart failure) (HCC)    Chronic cough    secondary to alleriges and post nasal drip   Cognitive impairment    COPD (chronic obstructive pulmonary disease) (HCC)    Cor pulmonale (HCC)    PA Peak pressure 43mHg   Depression    Diabetes mellitus    well controlled on metformin   Diastolic heart failure ([place]    External hemorrhoid 01/11/2020   GERD (gastroesophageal reflux disease)    Gross hematuria 09/13/2019   H/O mental retardation    Herpes    Hyperlipidemia    Hypertension    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   Obesity 10/14/2010   OSA (obstructive sleep apnea)    CPAP   Pulmonary hypertension (HCC)    Renal disorder    Restrictive lung disease    PFTs 06/2012 (FVC 54% predicted and FEV1 68% predicted w minimal bronchodilator response).   Shortness of breath    Swelling of lower extremity 08/13/2020   Vagina bleeding 10/15/2019   Venous stasis ulcer (HCC)    chornic, ?followed up at wound care center, multiple courses of antibiotics in past for cellulitis, on lasix   Vision changes 09/02/2020   Vomiting 03/26/2021    Current Outpatient Medications on File Prior to Visit  Medication Sig Dispense Refill   ACCU-CHEK AVIVA PLUS test strip USE 1 STRIP TO CHECK  GLUCOSE ONCE DAILY (Patient not taking: Reported on 08/27/2021) 50 each 3   acetaminophen (TYLENOL) 500 MG tablet Take 1,000 mg by mouth every 6 (six) hours as needed.     carboxymethylcellulose (REFRESH PLUS) 0.5 % SOLN 1 drop 3 (three) times daily as needed.     cetirizine (ZYRTEC) 10 MG tablet TAKE ONE TABLET BY MOUTH DAILY 90 tablet 4   clotrimazole-betamethasone (LOTRISONE) cream Apply 1 Application topically 2 (two) times daily. 45 g 1   cycloSPORINE (RESTASIS) 0.05 % ophthalmic emulsion Place 1 drop into both eyes 2 (two) times daily as needed (dry eyes).     diphenhydrAMINE (BENADRYL) 25 MG tablet Take 1 tablet (25 mg total) by mouth every 6 (six) hours as needed. 15 tablet 0   Ferrous Sulfate (IRON PO) Take by mouth daily.     Fluconazole POWD 1 Application by Does not apply route daily. 1000 g 0   fluticasone (FLONASE) 50 MCG/ACT nasal spray Place 1 spray into both nostrils daily as needed for allergies. 11.1 mL 1   gabapentin (NEURONTIN) 300 MG capsule TAKE TWO CAPSULES BY MOUTH THREE TIMES A DAY 180 capsule 3   metFORMIN (GLUCOPHAGE) 500 MG tablet TAKE HALF TABLET BY MOUTH DAILY WITH BREAKFAST 45 tablet 2   OXYGEN Inhale 4 L into the lungs continuous.     pantoprazole (PROTONIX) 20 MG tablet Take 1 tablet (20 mg total) by  mouth 2 (two) times daily. 180 tablet 2   potassium chloride SA (KLOR-CON M) 20 MEQ tablet TAKE TWO TABLETS BY MOUTH TWICE A DAY 180 tablet 3   pravastatin (PRAVACHOL) 20 MG tablet TAKE ONE TABLET BY MOUTH DAILY 90 tablet 3   rivaroxaban (XARELTO) 20 MG TABS tablet Take 1 tablet (20 mg total) by mouth daily with supper. 90 tablet 2   sildenafil (REVATIO) 20 MG tablet Take 1 tablet (20 mg total) by mouth 3 (three) times daily. 270 tablet 3   spironolactone (ALDACTONE) 25 MG tablet TAKE HALF TABLET BY MOUTH DAILY 45 tablet 4   torsemide (DEMADEX) 20 MG tablet TAKE FOUR TABLETS BY MOUTH TWICE A DAY 240 tablet 5   treprostinil (REMODULIN) 5 MG/ML SOLN injection See admin  instructions. As of 08/09/2019: Add 7 ml of Remodulin to cassette and 93 ml of sterile diluent for Remodulin to cassetet for a total volume of 100 ml to make a concentration of 350,000 ng per ml.  Infuse via a CADD pump intravenously at a rate of 40 ml per 24 hours. Based on a dosing weight of 120 kg, the dose is 81 ng per kg per min.  Once opened, discard Remodulin vial after 30 days.  Sterile Diluent for Remodulin vials are single use only. Once mixed, discard cassette after 48 hours.     vitamin B-12 (CYANOCOBALAMIN) 500 MCG tablet TAKE ONE TABLET BY MOUTH DAILY 30 tablet 3   No current facility-administered medications on file prior to visit.    Family History  Problem Relation Age of Onset   Kidney Stones Son    Mental illness Sister    Bipolar disorder Sister    Mental retardation Brother    Hyperlipidemia Mother    Breast cancer Maternal Aunt    Colon cancer Neg Hx    Pancreatic cancer Neg Hx    Esophageal cancer Neg Hx     Social History   Socioeconomic History   Marital status: Single    Spouse name: Not on file   Number of children: Not on file   Years of education: Not on file   Highest education level: Not on file  Occupational History   Not on file  Tobacco Use   Smoking status: Never   Smokeless tobacco: Never  Vaping Use   Vaping Use: Never used  Substance and Sexual Activity   Alcohol use: No    Alcohol/week: 0.0 standard drinks of alcohol   Drug use: No   Sexual activity: Not Currently  Other Topics Concern   Not on file  Social History Narrative   Not on file   Social Determinants of Health   Financial Resource Strain: Low Risk  (06/21/2018)   Overall Financial Resource Strain (CARDIA)    Difficulty of Paying Living Expenses: Not very hard  Food Insecurity: Food Insecurity Present (11/05/2021)   Hunger Vital Sign    Worried About Running Out of Food in the Last Year: Sometimes true    Ran Out of Food in the Last Year: Sometimes true  Transportation  Needs: Unmet Transportation Needs (11/05/2021)   PRAPARE - Transportation    Lack of Transportation (Medical): No    Lack of Transportation (Non-Medical): Yes  Physical Activity: Insufficiently Active (04/22/2021)   Exercise Vital Sign    Days of Exercise per Week: 2 days    Minutes of Exercise per Session: 20 min  Stress: No Stress Concern Present (06/21/2018)   [place] -  Occupational Stress Questionnaire    Feeling of Stress : Not at all  Social Connections: Socially Isolated (11/05/2021)   Social Connection and Isolation Panel [NHANES]    Frequency of Communication with Friends and Family: More than three times a week    Frequency of Social Gatherings with Friends and Family: More than three times a week    Attends Religious Services: Never    Marine scientist or Organizations: No    Attends Archivist Meetings: Never    Marital Status: Never married  Intimate Partner Violence: Not At Risk (11/05/2021)   Humiliation, Afraid, Rape, and Kick questionnaire    Fear of Current or Ex-Partner: No    Emotionally Abused: No    Physically Abused: No    Sexually Abused: No    Review of Systems: ROS negative except for what is noted on the assessment and plan.  Objective:   Vitals:   11/05/21 1000  BP: (!) 103/57  Pulse: 83  Temp: 98.2 F (36.8 C)  TempSrc: Oral  SpO2: 95%  Weight: 275 lb 11.2 oz (125.1 kg)    Physical Exam: Constitutional: chronically ill appearing Cardiovascular: regular rate and rhythm, no m/r/g Pulmonary/Chest: normal work of breathing on room air, lungs clear to auscultation bilaterally Abdominal: soft, non-tender, non-distended MSK: normal bulk and tone, chronic venous stasis changes to lower extremities bilaterally Skin: warm and dry Psych: normal mood and affect     Assessment & Plan:  Chronic venous insufficiency Ms. Teague follows up for weekly unna boot change. She has had chronic venous insufficiency  for several years. Transportation has been the major barrier to her going to lymphedema clinic in [place]. She has an appointment with that clinic 11/30 and transportation.  Plan: -replaced UNNA boots today -follow-up with [facility] 11/30. -f/u 1 week, could space out if legs continue to look ok following visit next week until she established with lymphedema clinic.  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 Catania presents with difficulty carrying oxygen tank, her home concentrator is not working well, and her CPAP does not have the appropriate seal. She follows with [facility] for remodulin therapy and taked sildenafil 20 mg TID. She wears 4L of oxygen daily and CPAP nightly. She continues to have function limiting shortness of breath. A/P: [employer] spoke with patient and she is interested in switching to their company. 6 min walk test completed and patient dropped to 83% with ambulation and recovered with 4L O2. -DME order placed for home oxygen supplies and CPAP.    Patient discussed with Dr. Brent General Erikah Thumm, D.O. [facility]  PGY-2 Pager: [phone]  Phone: [phone] Date 11/06/2021  Time 2:42 PM

## 2021-11-06 NOTE — Assessment & Plan Note (Signed)
Ms. Keltz follows up for weekly unna boot change. She has had chronic venous insufficiency for several years. Transportation has been the major barrier to her going to lymphedema clinic in [place]. She has an appointment with that clinic 11/30 and transportation.  Plan: -replaced UNNA boots today -follow-up with [facility] 11/30. -f/u 1 week, could space out if legs continue to look ok following visit next week until she established with lymphedema clinic.

## 2021-11-06 NOTE — Progress Notes (Signed)
Internal Medicine Clinic Attending  Case discussed with Dr. Masters  At the time of the visit.  We reviewed the resident's history and exam and pertinent patient test results.  I agree with the assessment, diagnosis, and plan of care documented in the resident's note.  

## 2021-11-06 NOTE — Assessment & Plan Note (Signed)
[name] presents with difficulty carrying oxygen tank, her home concentrator is not working well, and her CPAP does not have the appropriate seal. She follows with [facility] for remodulin therapy and taked sildenafil 20 mg TID. She wears 4L of oxygen daily and CPAP nightly. She continues to have function limiting shortness of breath. A/P: [employer] spoke with patient and she is interested in switching to their company. 6 min walk test completed and patient dropped to 83% with ambulation and recovered with 4L O2. -DME order placed for home oxygen supplies and CPAP.

## 2021-11-10 ENCOUNTER — Encounter: Payer: Self-pay | Admitting: Dietician

## 2021-11-11 DIAGNOSIS — E119 Type 2 diabetes mellitus without complications: Secondary | ICD-10-CM | POA: Diagnosis not present

## 2021-11-11 DIAGNOSIS — N3946 Mixed incontinence: Secondary | ICD-10-CM | POA: Diagnosis not present

## 2021-11-12 ENCOUNTER — Ambulatory Visit: Payer: Medicaid Other | Admitting: Internal Medicine

## 2021-11-12 ENCOUNTER — Other Ambulatory Visit: Payer: Self-pay

## 2021-11-12 ENCOUNTER — Encounter: Payer: Self-pay | Admitting: Internal Medicine

## 2021-11-12 VITALS — BP 101/64 | HR 91 | Temp 98.1°F | Wt 273.2 lb

## 2021-11-12 DIAGNOSIS — I872 Venous insufficiency (chronic) (peripheral): Secondary | ICD-10-CM

## 2021-11-12 NOTE — Patient Instructions (Signed)
Thank you, Ms.Aryaa A Jaycox for allowing [place] to provide your care today.   Lower extremity swelling Remove Unna boots next week. Follow-up with lymphedema clinic 11/30.  I will add the size to compression wraps.  I do think the blood in the toilet is from your hemorrhoids.   I have ordered the following medication/changed the following medications:   Stop the following medications: There are no discontinued medications.   Start the following medications: No orders of the defined types were placed in this encounter.    Follow up: 3 months for diabetes   We look forward to seeing you next time. Please call our clinic at [phone] if you have any questions or concerns. The best time to call is Monday-Friday from 9am-4pm, but there is someone available 24/7. If after hours or the weekend, call the main hospital number and ask for the Internal Medicine Resident On-Call. If you need medication refills, please notify your pharmacy one week in advance and they will send [place] a request.   Thank you for trusting me with your care. Wishing you the best!   Christiana Fuchs, [place]

## 2021-11-12 NOTE — Progress Notes (Signed)
Unna boots applied to patient on Bil lower extremities.

## 2021-11-12 NOTE — Progress Notes (Signed)
Subjective:  CC: chronic venous insufficiency  HPI:  [name] is a 61 y.o. female with a past medical history stated below and presents today for follow-up on chronic venous insufficiency.  Please see problem based assessment and plan for additional details.  Past Medical History:  Diagnosis Date   Acute on chronic diastolic (congestive) heart failure ([place]) 06/25/2013   COPD (chronic obstructive pulmonary disease) (HCC)    Cor pulmonale (HCC)    PA Peak pressure 63mHg   Depression    Diabetes mellitus    well controlled on metformin   External hemorrhoid 01/11/2020   GERD (gastroesophageal reflux disease)    H/O mental retardation    Hyperlipidemia    Hypertension    OSA (obstructive sleep apnea)    CPAP   Pulmonary hypertension (HCC)    Restrictive lung disease    PFTs 06/2012 (FVC 54% predicted and FEV1 68% predicted w minimal bronchodilator response).   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 ulcer (HCC)    chornic, ?followed up at wound care center, multiple courses of antibiotics in past for cellulitis, on lasix    Current Outpatient Medications on File Prior to Visit  Medication Sig Dispense Refill   ACCU-CHEK AVIVA PLUS test strip USE 1 STRIP TO CHECK GLUCOSE ONCE DAILY (Patient not taking: Reported on 08/27/2021) 50 each 3   acetaminophen (TYLENOL) 500 MG tablet Take 1,000 mg by mouth every 6 (six) hours as needed.     carboxymethylcellulose (REFRESH PLUS) 0.5 % SOLN 1 drop 3 (three) times daily as needed.     cetirizine (ZYRTEC) 10 MG tablet TAKE ONE TABLET BY MOUTH DAILY 90 tablet 4   clotrimazole-betamethasone (LOTRISONE) cream Apply 1 Application topically 2 (two) times daily. 45 g 1   cycloSPORINE (RESTASIS) 0.05 % ophthalmic emulsion Place 1 drop into both eyes 2 (two) times daily as needed (dry eyes).     diphenhydrAMINE (BENADRYL) 25 MG tablet Take 1  tablet (25 mg total) by mouth every 6 (six) hours as needed. 15 tablet 0   Ferrous Sulfate (IRON PO) Take by mouth daily.     Fluconazole POWD 1 Application by Does not apply route daily. 1000 g 0   fluticasone (FLONASE) 50 MCG/ACT nasal spray Place 1 spray into both nostrils daily as needed for allergies. 11.1 mL 1   gabapentin (NEURONTIN) 300 MG capsule TAKE TWO CAPSULES BY MOUTH THREE TIMES A DAY 180 capsule 3   metFORMIN (GLUCOPHAGE) 500 MG tablet TAKE HALF TABLET BY MOUTH DAILY WITH BREAKFAST 45 tablet 2   OXYGEN Inhale 4 L into the lungs continuous.     pantoprazole (PROTONIX) 20 MG tablet Take 1 tablet (20 mg total) by mouth 2 (two) times daily. 180 tablet 2   potassium chloride SA (KLOR-CON M) 20 MEQ tablet TAKE TWO TABLETS BY MOUTH TWICE A DAY 180 tablet 3   pravastatin (PRAVACHOL) 20 MG tablet TAKE ONE TABLET BY MOUTH DAILY 90 tablet 3   rivaroxaban (XARELTO) 20 MG TABS tablet Take 1 tablet (20 mg total) by mouth daily with supper. 90 tablet 2   sildenafil (REVATIO) 20 MG tablet Take 1 tablet (20 mg total) by mouth 3 (three) times daily. 270 tablet 3   spironolactone (ALDACTONE) 25 MG tablet TAKE HALF TABLET BY MOUTH DAILY 45 tablet 4   torsemide (DEMADEX) 20 MG tablet TAKE FOUR TABLETS BY MOUTH TWICE  A DAY 240 tablet 5   treprostinil (REMODULIN) 5 MG/ML SOLN injection See admin instructions. As of 08/09/2019: Add 7 ml of Remodulin to cassette and 93 ml of sterile diluent for Remodulin to cassetet for a total volume of 100 ml to make a concentration of 350,000 ng per ml.  Infuse via a CADD pump intravenously at a rate of 40 ml per 24 hours. Based on a dosing weight of 120 kg, the dose is 81 ng per kg per min.  Once opened, discard Remodulin vial after 30 days.  Sterile Diluent for Remodulin vials are single use only. Once mixed, discard cassette after 48 hours.     vitamin B-12 (CYANOCOBALAMIN) 500 MCG tablet TAKE ONE TABLET BY MOUTH DAILY 30 tablet 3   No current facility-administered  medications on file prior to visit.    Family History  Problem Relation Age of Onset   Kidney Stones Son    Mental illness Sister    Bipolar disorder Sister    Mental retardation Brother    Hyperlipidemia Mother    Breast cancer Maternal Aunt    Colon cancer Neg Hx    Pancreatic cancer Neg Hx    Esophageal cancer Neg Hx     Social History   Socioeconomic History   Marital status: Single    Spouse name: Not on file   Number of children: Not on file   Years of education: Not on file   Highest education level: Not on file  Occupational History   Not on file  Tobacco Use   Smoking status: Never   Smokeless tobacco: Never  Vaping Use   Vaping Use: Never used  Substance and Sexual Activity   Alcohol use: No    Alcohol/week: 0.0 standard drinks of alcohol   Drug use: No   Sexual activity: Not Currently  Other Topics Concern   Not on file  Social History Narrative   Not on file   Social Determinants of Health   Financial Resource Strain: Low Risk  (06/21/2018)   Overall Financial Resource Strain (CARDIA)    Difficulty of Paying Living Expenses: Not very hard  Food Insecurity: Food Insecurity Present (11/05/2021)   Hunger Vital Sign    Worried About Running Out of Food in the Last Year: Sometimes true    Ran Out of Food in the Last Year: Sometimes true  Transportation Needs: Unmet Transportation Needs (11/05/2021)   PRAPARE - Transportation    Lack of Transportation (Medical): No    Lack of Transportation (Non-Medical): Yes  Physical Activity: Insufficiently Active (04/22/2021)   Exercise Vital Sign    Days of Exercise per Week: 2 days    Minutes of Exercise per Session: 20 min  Stress: No Stress Concern Present (06/21/2018)   [place]    Feeling of Stress : Not at all  Social Connections: Socially Isolated (11/05/2021)   Social Connection and Isolation Panel [NHANES]    Frequency of Communication with  Friends and Family: More than three times a week    Frequency of Social Gatherings with Friends and Family: More than three times a week    Attends Religious Services: Never    Marine scientist or Organizations: No    Attends Archivist Meetings: Never    Marital Status: Never married  Intimate Partner Violence: Not At Risk (11/05/2021)   Humiliation, Afraid, Rape, and Kick questionnaire    Fear of Current or Ex-Partner: No  Emotionally Abused: No    Physically Abused: No    Sexually Abused: No    Review of Systems: ROS negative except for what is noted on the assessment and plan.  Objective:   Vitals:   11/12/21 1111 11/12/21 1137  BP: (!) 127/94 101/64  Pulse: 95 91  Temp: 98.1 F (36.7 C)   TempSrc: Oral   SpO2: 94% 93%  Weight: 273 lb 3.2 oz (123.9 kg)     Physical Exam: Constitutional: well-appearing Cardiovascular: regular rate and rhythm, no m/r/g Pulmonary/Chest: normal work of breathing on room air, lungs clear to auscultation bilaterally MSK: chronic venous stasis changes to lower extremity bilaterally, no ulcerations Neurological: alert & oriented x 3, 5/5 strength in bilateral upper and lower extremities, normal gait Skin: warm and dry  Assessment & Plan:  Chronic venous insufficiency Unna boots changed in clinic. No signs of ulcerations to lower extremity bilaterally. She reports that unna boots have helped with the pain in her legs. She has talked with the medical supply company about juxtafit wrappings and was told that her size needs to be put on order prior to her being able to get them. She does have follow-up with lymphedema clinic in [facility] 11/30. P: DME order placed with size added She will removed Unna boots at home in 1 week Follow-up with Lymphedema clinic 11.30, she has transportation arranged    Patient discussed with Dr. Jay Schlichter Daimen Shovlin, D.O. [facility]  PGY-2 Pager: [phone]  Phone:  [phone] Date 11/13/2021  Time 3:26 PM

## 2021-11-13 NOTE — Assessment & Plan Note (Signed)
Unna boots changed in clinic. No signs of ulcerations to lower extremity bilaterally. She reports that unna boots have helped with the pain in her legs. She has talked with the medical supply company about juxtafit wrappings and was told that her size needs to be put on order prior to her being able to get them. She does have follow-up with lymphedema clinic in [facility] 11/30. P: DME order placed with size added She will removed Unna boots at home in 1 week Follow-up with Lymphedema clinic 11.30, she has transportation arranged

## 2021-11-16 ENCOUNTER — Other Ambulatory Visit: Payer: Self-pay | Admitting: Internal Medicine

## 2021-11-16 ENCOUNTER — Encounter: Payer: Self-pay | Admitting: *Deleted

## 2021-11-16 ENCOUNTER — Other Ambulatory Visit: Payer: Medicaid Other | Admitting: *Deleted

## 2021-11-16 DIAGNOSIS — I4892 Unspecified atrial flutter: Secondary | ICD-10-CM

## 2021-11-16 DIAGNOSIS — K219 Gastro-esophageal reflux disease without esophagitis: Secondary | ICD-10-CM

## 2021-11-16 NOTE — Patient Outreach (Signed)
Medicaid Managed Care   Nurse Care Manager Note  11/16/2021 Name:  [name] MRN:  [id number] DOB:  06-15-1960  [name] is an 61 y.o. year old female who is Barker primary patient of Masters, Faith Jersey, DO.  The [facility] Managed Care Coordination team was consulted for assistance with:    Lymphedema  [name] was given information about Medicaid Managed Care Coordination team services today. [name] Patient agreed to services and verbal consent obtained.  Engaged with patient by telephone for follow up visit in response to provider referral for case management and/or care coordination services.   Assessments/Interventions:  Review of past medical history, allergies, medications, health status, including review of consultants reports, laboratory and other test data, was performed as part of comprehensive evaluation and provision of chronic care management services.  SDOH (Social Determinants of Health) assessments and interventions performed: SDOH Interventions    Flowsheet Row Patient Outreach Telephone from 11/16/2021 in [place] Office Visit from 11/05/2021 in [place] Patient Outreach Telephone from 04/22/2021 in [place] Patient Outreach Telephone from 02/24/2021 in [place] Patient Outreach Telephone from 01/08/2021 in [place] Patient Outreach Telephone from 12/18/2020 in [place] Interventions        Food Insecurity Interventions -- -- -- -- Intervention Not Indicated --  Housing Interventions -- -- -- Intervention Not Indicated Intervention Not Indicated --  Transportation Interventions Other (Comment)  [Provided patient with requested addresses to assist her with utilizing SCAT] -- -- Intervention Not Indicated -- Intervention Not Indicated   Utilities Interventions -- Intervention Not Indicated -- -- -- --  Physical Activity Interventions -- -- Intervention Not Indicated -- -- --       Care Plan  Allergies  Allergen Reactions   Aspirin Swelling    REACTION: airway swelling   Codeine Other (See Comments)    REACTION: tingling in lips and hard breathing - had reaction at dentist - states "I can't take certain kinds of codeine" - happened maybe 10 yr ago   Lisinopril Cough   Sulfonamide Derivatives Swelling    REACTION: airway swelling   Latex Rash    Medications Reviewed Today     Reviewed by Faith Montane, RN (Registered Nurse) on 11/16/21 at 1250  Med List Status: <None>   Medication Order Taking? Sig Documenting Provider Last Dose Status Informant  ACCU-CHEK Faith PLUS test strip [id number]  USE 1 STRIP TO CHECK GLUCOSE ONCE DAILY  Patient not taking: Reported on 08/27/2021   Faith Roan, Barker  Active Self  acetaminophen (TYLENOL) 500 MG tablet [id number] Yes Take 1,000 mg by mouth every 6 (six) hours as needed. Provider, Historical, Barker Taking Active   carboxymethylcellulose (REFRESH PLUS) 0.5 % SOLN [id number] Yes 1 drop 3 (three) times daily as needed. Provider, Historical, Barker Taking Active   cetirizine (ZYRTEC) 10 MG tablet [id number] Yes TAKE ONE TABLET BY MOUTH DAILY Masters, Katie, DO Taking Active   clotrimazole-betamethasone (LOTRISONE) cream [id number] Yes Apply 1 Application topically 2 (two) times daily. [name], DPM Taking Active   cycloSPORINE (RESTASIS) 0.05 % ophthalmic emulsion [id number] No Place 1 drop into both eyes 2 (two) times daily as needed (dry eyes).  Patient not taking: Reported on 11/16/2021   Provider, Historical, Barker Not Taking Active   diphenhydrAMINE (BENADRYL) 25 MG tablet [id number] Yes Take 1 tablet (  25 mg total) by mouth every 6 (six) hours as needed. Faith Cure, DO Taking Active   Ferrous Sulfate (IRON PO) [id number] Yes Take by mouth daily. Provider, Historical, Barker  Taking Active   Fluconazole POWD [id number] No 1 Application by Does not apply route daily.  Patient not taking: Reported on 11/16/2021   Faith Ruffini, Barker Not Taking Active            Med Note [name], [name]   Mon Nov 16, 2021 12:46 PM) Patient needs to pick up prescription  fluticasone (FLONASE) 50 MCG/ACT nasal spray [id number]  Place 1 spray into both nostrils daily as needed for allergies. Faith Noble, Barker  Expired 05/13/21 2359 Self  gabapentin (NEURONTIN) 300 MG capsule [id number] Yes TAKE TWO CAPSULES BY MOUTH THREE TIMES Barker DAY Masters, Katie, DO Taking Active   metFORMIN (GLUCOPHAGE) 500 MG tablet [id number] Yes TAKE HALF TABLET BY MOUTH DAILY WITH Faith [name], DO Taking Active   OXYGEN [id number] Yes Inhale 4 L into the lungs continuous. Provider, Historical, Barker Taking Active Self  pantoprazole (PROTONIX) 20 MG tablet [id number] Yes Take 1 tablet (20 mg total) by mouth 2 (two) times daily. Masters, Scientist, research (physical sciences), DO Taking Active   potassium chloride SA (KLOR-CON M) 20 MEQ tablet [id number] Yes TAKE TWO TABLETS BY MOUTH TWICE Barker DAY Masters, Katie, DO Taking Active   pravastatin (PRAVACHOL) 20 MG tablet [id number] Yes TAKE ONE TABLET BY MOUTH DAILY Masters, Faith Jersey, DO Taking Active   rivaroxaban (XARELTO) 20 MG TABS tablet [id number] Yes Take 1 tablet (20 mg total) by mouth daily with supper. Masters, Faith Jersey, DO Taking Active   sildenafil (REVATIO) 20 MG tablet [id number] Yes Take 1 tablet (20 mg total) by mouth 3 (three) times daily. [name], [name] Taking Active   spironolactone (ALDACTONE) 25 MG tablet [id number] Yes TAKE HALF TABLET BY MOUTH DAILY Masters, Katie, DO Taking Active   torsemide (DEMADEX) 20 MG tablet [id number] Yes TAKE FOUR TABLETS BY MOUTH TWICE Barker DAY Masters, Katie, DO Taking Active   treprostinil (REMODULIN) 5 MG/ML SOLN injection [id number] Yes See admin instructions. As of 08/09/2019: Add 7 ml of Remodulin to cassette and 93 ml of sterile diluent for  Remodulin to cassetet for Barker total volume of 100 ml to make Barker concentration of 350,000 ng per ml.  Infuse via Barker CADD pump intravenously at Barker rate of 40 ml per 24 hours. Based on Barker dosing weight of 120 kg, the dose is 81 ng per kg per min.  Once opened, discard Remodulin vial after 30 days.  Sterile Diluent for Remodulin vials are single use only. Once mixed, discard cassette after 48 hours. Provider, Historical, Barker Taking Active Self           Med Note Ralene Cork Dec 17, 2019 12:50 PM) Continuous infusion 12/17/19  vitamin B-12 (CYANOCOBALAMIN) 500 MCG tablet [id number] Yes TAKE ONE TABLET BY MOUTH DAILY Masters, Faith Jersey, DO Taking Active             Patient Active Problem List   Diagnosis Date Noted   Maceration of skin 10/10/2021   Chronic venous insufficiency 08/01/2021   Knee osteoarthritis 12/12/2020   Depression 09/02/2020   Left paraspinal back pain 05/05/2020   Hearing difficulty of both ears 05/05/2020   Internal hemorrhoid 01/11/2020   Fibroid, uterine 11/07/2019   Bleeding from the genitourinary system 09/13/2019   Urinary incontinence 12/12/2018   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  [place])    Right ovarian cyst 05/19/2017   Rash 10/31/2014   Peripheral neuropathy ([place]) 09/27/2014   Healthcare maintenance 09/27/2014   Atrial flutter ([place]) 08/17/2012   Right heart failure, NYHA class 3 ([place]) 03/15/2012   Allergic rhinitis 11/16/2011   Diabetes ([place]) 10/15/2010   Hyperlipidemia 10/15/2010   Obstructive sleep apnea 02/21/2009   GERD 11/29/2005    Conditions to be addressed/monitored per PCP order:   Lymphedema  Care Plan : [place] of Care  Updates made by Faith Montane, RN since 11/16/2021 12:00 AM     Problem: Health Management needs related to Lymphedema      Long-Range Goal: Development of Plan of Care to address Health Management needs related to Lymphedema   Start Date: 11/16/2021  Expected End Date:  02/14/2022  Note:   Current Barriers:  Care Coordination needs related to Level of care concerns  Chronic Disease Management support and education needs related to Lymphedema  Ms. Buist would like to apply for Medicare if appropriate. She has lost her address book and would like addresses to her frequently visited locations.  RNCM Clinical Goal(s):  Patient will verbalize understanding of plan for management of Lymphedema as evidenced by patient reports take all medications exactly as prescribed and will call provider for medication related questions as evidenced by patient reports    attend all scheduled medical appointments: 12/03/21 at [facility] and 12/04/21 for Mammogram as evidenced by documentation in EMR        work with social worker to address Level of care concerns related to the management of Lymphedema as evidenced by review of EMR and patient or social worker report        Interventions: Inter-disciplinary care team collaboration (see longitudinal plan of care) Evaluation of current treatment plan related to  self management and patient's adherence to plan as established by provider   Lymphedema  (Status: New goal.) Long Term Goal  Evaluation of current treatment plan related to  Lymphedema ,  self-management and patient's adherence to plan as established by provider. Discussed plans with patient for ongoing care management follow up and provided patient with direct contact information for care management team Reviewed medications with patient and discussed having needed prescription transferred to her current pharmacy; Collaborated with BSW regarding assistance with applying for Medicare if appropriate; Reviewed scheduled/upcoming provider appointments including 12/03/21 for [facility] and 12/04/21 for Mammography; Assessed social determinant of health barriers;  Provided patient with addresses to requested locations to assist her with utilizing [employer] transportation Advised  patient to contact PCP office to determine if she needs to keep appointments on 11/19/21 and 11/25/21-patient reports per PCP, she did not need these appointments   Patient Goals/Self-Care Activities: Take medications as prescribed   Attend all scheduled provider appointments Call provider office for new concerns or questions  Work with the social worker to address care coordination needs and will continue to work with the clinical team to address health care and disease management related needs       Follow Up:  Patient agrees to Care Plan and Follow-up.  Plan: The Managed Medicaid care management team will reach out to the patient again over the next 30 days.  Date/time of next scheduled RN care management/care coordination outreach:  12/07/21 @ 1:15pm  [name] RN, BSN [place]  [employer] manager

## 2021-11-16 NOTE — Patient Instructions (Signed)
Visit Information  [name] was given information about Medicaid Managed Care team care coordination services as a part of their Healthy [facility] Medicaid benefit. [name] verbally consented to engagement with the [facility] Managed Care team.   If you are experiencing a medical emergency, please call 911 or report to your local emergency department or urgent care.   If you have a non-emergency medical problem during routine business hours, please contact your provider's office and ask to speak with a nurse.   For questions related to your Healthy [facility] health plan, please call: [phone] or visit the homepage here: [email or web address]  If you would like to schedule transportation through your Healthy [facility] plan, please call the following number at least 2 days in advance of your appointment: [phone]  For information about your ride after you set it up, call Ride Assist at [phone]. Use this number to activate a Will Call pickup, or if your transportation is late for a scheduled pickup. Use this number, too, if you need to make a change or cancel a previously scheduled reservation.  If you need transportation services right away, call [phone]. The after-hours call center is staffed 24 hours to handle ride assistance and urgent reservation requests (including discharges) 365 days a year. Urgent trips include sick visits, hospital discharge requests and life-sustaining treatment.  Call the [place] at [phone], at any time, 24 hours a day, 7 days a week. If you are in danger or need immediate medical attention call 911.  If you would like help to quit smoking, call 1-800-QUIT-NOW 857-429-1376) OR Espaol: 1-855-Djelo-Ya (1-829-937-1696) o para ms informacin haga clic aqu or Text READY to 200-400 to register via text  [name],   Please see education materials related to Lymphedema provided as  print materials.   The patient verbalized understanding of instructions, educational materials, and care plan provided today and agreed to receive a mailed copy of patient instructions, educational materials, and care plan.   Telephone follow up appointment with Managed Medicaid care management team member scheduled for:12/07/21 @ 1:15pm  Faith Joiner RN, BSN [place] RN Care Coordinator   Following is a copy of your plan of care:  Care Plan : RN Care Manager Plan of Care  Updates made by Melissa Montane, RN since 11/16/2021 12:00 AM     Problem: Health Management needs related to Lymphedema      Long-Range Goal: Development of Plan of Care to address Health Management needs related to Lymphedema   Start Date: 11/16/2021  Expected End Date: 02/14/2022  Note:   Current Barriers:  Care Coordination needs related to Level of care concerns  Chronic Disease Management support and education needs related to Lymphedema  [name] would like to apply for Medicare if appropriate. She has lost her address book and would like addresses to her frequently visited locations.  RNCM Clinical Goal(s):  Patient will verbalize understanding of plan for management of Lymphedema as evidenced by patient reports take all medications exactly as prescribed and will call provider for medication related questions as evidenced by patient reports    attend all scheduled medical appointments: 12/03/21 at [facility] and 12/04/21 for Mammogram as evidenced by documentation in EMR        work with social worker to address Level of care concerns related to the management of Lymphedema as evidenced by review of EMR and patient or social worker report  Interventions: Inter-disciplinary care team collaboration (see longitudinal plan of care) Evaluation of current treatment plan related to  self management and patient's adherence to plan as established by provider   Lymphedema   (Status: New goal.) Long Term Goal  Evaluation of current treatment plan related to  Lymphedema ,  self-management and patient's adherence to plan as established by provider. Discussed plans with patient for ongoing care management follow up and provided patient with direct contact information for care management team Reviewed medications with patient and discussed having needed prescription transferred to her current pharmacy; Collaborated with BSW regarding assistance with applying for Medicare if appropriate; Reviewed scheduled/upcoming provider appointments including 12/03/21 for Lymph Clinic and 12/04/21 for Mammography; Assessed social determinant of health barriers;  Provided patient with addresses to requested locations to assist her with utilizing [employer] transportation Advised patient to contact PCP office to determine if she needs to keep appointments on 11/19/21 and 11/25/21-patient reports per PCP, she did not need these appointments   Patient Goals/Self-Care Activities: Take medications as prescribed   Attend all scheduled provider appointments Call provider office for new concerns or questions  Work with the social worker to address care coordination needs and will continue to work with the clinical team to address health care and disease management related needs       Requested [employer] [address] [place], [place] [address]  [employer] [address] [place], [place] [address]  [facility] [employer] [address] [place], [place] [address]  [employer] [address] [place], [place] [address]  [employer]  [address] [place], [place] [address]  [employer] [address] [place], [place] [address] [place] D'Hanis [address] [place], [place] [address] [phone]  [facility] [address] [place], [place] [address] [phone]

## 2021-11-16 NOTE — Progress Notes (Signed)
Internal Medicine Clinic Attending  Case discussed with Dr. Masters  At the time of the visit.  We reviewed the resident's history and exam and pertinent patient test results.  I agree with the assessment, diagnosis, and plan of care documented in the resident's note.  

## 2021-11-18 ENCOUNTER — Other Ambulatory Visit: Payer: Medicaid Other

## 2021-11-18 NOTE — Patient Outreach (Signed)
Medicaid Managed Care Social Work Note  11/18/2021 Name:  [name] MRN:  [id number] DOB:  03-10-1960  [name] is an 61 y.o. year old female who is a primary patient of Masters, [name], DO.  The Medicaid Managed Care Coordination team was consulted for assistance with:  Community Resources   Ms. Bassett was given information about Medicaid Managed Care Coordination team services today. [name] Patient agreed to services and verbal consent obtained.  Engaged with patient  for by telephone forinitial visit  in response to referral for case management and/or care coordination services.   Assessments/Interventions:  Review of past medical history, allergies, medications, health status, including review of consultants reports, laboratory and other test data, was performed as part of comprehensive evaluation and provision of chronic care management services.  SDOH: (Social Determinant of Health) assessments and interventions performed: SDOH Interventions    Flowsheet Row Patient Outreach Telephone from 11/16/2021 in [place] Office Visit from 11/05/2021 in [place] Patient Outreach Telephone from 04/22/2021 in [place] Patient Outreach Telephone from 02/24/2021 in [place] Patient Outreach Telephone from 01/08/2021 in [place] Patient Outreach Telephone from 12/18/2020 in [place] Coordination  SDOH Interventions        Food Insecurity Interventions -- -- -- -- Intervention Not Indicated --  Housing Interventions -- -- -- Intervention Not Indicated Intervention Not Indicated --  Transportation Interventions Other (Comment)  [Provided patient with requested addresses to assist her with utilizing [employer]] -- -- Intervention Not Indicated -- Intervention Not Indicated   Utilities Interventions -- Intervention Not Indicated -- -- -- --  Physical Activity Interventions -- -- Intervention Not Indicated -- -- --     BSW completed a telephone outreach with patient. She receives 914 in disability each month and receives foodstamps. Patient states she recently found out she does not qualify for social security, patient stated she received an ad for a food card but she has to have Medicare. BSW and patient discussed patient applying for Medicare but would have to pay towards it due to her being 36 with a disability. Patient stated the only problem she is having right now is with getting food. BSW encouraged patient to use her foodstamps at the farmers market and sent patient a list of food pantries. Patient also stated that she would like a dentist, BSW added dental resources to be mailed to patient.   Advanced Directives Status:  Not addressed in this encounter.  Care Plan                 Allergies  Allergen Reactions   Aspirin Swelling    REACTION: airway swelling   Codeine Other (See Comments)    REACTION: tingling in lips and hard breathing - had reaction at dentist - states "I can't take certain kinds of codeine" - happened maybe 10 yr ago   Lisinopril Cough   Sulfonamide Derivatives Swelling    REACTION: airway swelling   Latex Rash    Medications Reviewed Today     Reviewed by Melissa Montane, RN (Registered Nurse) on 11/16/21 at 1250  Med List Status: <None>   Medication Order Taking? Sig Documenting Provider Last Dose Status Informant  ACCU-CHEK AVIVA PLUS test strip [id number]  USE 1 STRIP TO CHECK GLUCOSE ONCE DAILY  Patient not taking: Reported on 08/27/2021   [name], MD  Active Self  acetaminophen (TYLENOL) 500 MG tablet [id number] Yes Take 1,000 mg by mouth every 6 (six) hours as needed. [provider] Taking Active   carboxymethylcellulose (REFRESH PLUS) 0.5 % SOLN [id number] Yes 1 drop 3 (three) times daily as needed.  [provider] Taking Active   cetirizine (ZYRTEC) 10 MG tablet [id number] Yes TAKE ONE TABLET BY MOUTH DAILY Masters, Katie, DO Taking Active   clotrimazole-betamethasone (LOTRISONE) cream [id number] Yes Apply 1 Application topically 2 (two) times daily. Edrick Kins, DPM Taking Active   cycloSPORINE (RESTASIS) 0.05 % ophthalmic emulsion [id number] No Place 1 drop into both eyes 2 (two) times daily as needed (dry eyes).  Patient not taking: Reported on 11/16/2021   [provider] Not Taking Active   diphenhydrAMINE (BENADRYL) 25 MG tablet [id number] Yes Take 1 tablet (25 mg total) by mouth every 6 (six) hours as needed. Lianne Cure, DO Taking Active   Ferrous Sulfate (IRON PO) [id number] Yes Take by mouth daily. [provider] Taking Active   Fluconazole POWD [id number] No 1 Application by Does not apply route daily.  Patient not taking: Reported on 11/16/2021   Delene Ruffini, MD Not Taking Active            Med Note [name], MELANIE A   Mon Nov 16, 2021 12:46 PM) Patient needs to pick up prescription  fluticasone (FLONASE) 50 MCG/ACT nasal spray [id number]  Place 1 spray into both nostrils daily as needed for allergies. [name], MD  Expired 05/13/21 2359 Self  gabapentin (NEURONTIN) 300 MG capsule [id number] Yes TAKE TWO CAPSULES BY MOUTH THREE TIMES A DAY Masters, Katie, DO Taking Active   metFORMIN (GLUCOPHAGE) 500 MG tablet [id number] Yes TAKE HALF TABLET BY MOUTH DAILY WITH Aviva [name], DO Taking Active   OXYGEN [id number] Yes Inhale 4 L into the lungs continuous. [provider] Taking Active Self  pantoprazole (PROTONIX) 20 MG tablet [id number] Yes Take 1 tablet (20 mg total) by mouth 2 (two) times daily. Masters, Scientist, research (physical sciences), DO Taking Active   potassium chloride SA (KLOR-CON M) 20 MEQ tablet [id number] Yes TAKE TWO TABLETS BY MOUTH TWICE A DAY Masters, Katie, DO Taking Active   pravastatin (PRAVACHOL) 20 MG tablet [id number] Yes TAKE ONE  TABLET BY MOUTH DAILY Masters, [name], DO Taking Active   rivaroxaban (XARELTO) 20 MG TABS tablet [id number] Yes Take 1 tablet (20 mg total) by mouth daily with supper. Masters, [name], DO Taking Active   sildenafil (REVATIO) 20 MG tablet [id number] Yes Take 1 tablet (20 mg total) by mouth 3 (three) times daily. Bensimhon, [name], MD Taking Active   spironolactone (ALDACTONE) 25 MG tablet [id number] Yes TAKE HALF TABLET BY MOUTH DAILY Masters, Katie, DO Taking Active   torsemide (DEMADEX) 20 MG tablet [id number] Yes TAKE FOUR TABLETS BY MOUTH TWICE A DAY Masters, Katie, DO Taking Active   treprostinil (REMODULIN) 5 MG/ML SOLN injection [id number] Yes See admin instructions. As of 08/09/2019: Add 7 ml of Remodulin to cassette and 93 ml of sterile diluent for Remodulin to cassetet for a total volume of 100 ml to make a concentration of 350,000 ng per ml.  Infuse via a CADD pump intravenously at a rate of 40 ml per 24 hours. Based on a dosing weight of 120 kg, the dose is 81 ng per kg per min.  Once opened, discard Remodulin vial after 30 days.  Sterile Diluent for Remodulin vials are single use only. Once mixed, discard cassette after 48 hours.  [provider] Taking Active Self           Med Note Ralene Cork Dec 17, 2019 12:50 PM) Continuous infusion 12/17/19  vitamin B-12 (CYANOCOBALAMIN) 500 MCG tablet [id number] Yes TAKE ONE TABLET BY MOUTH DAILY Masters, [name], DO Taking Active             Patient Active Problem List   Diagnosis Date Noted   Maceration of skin 10/10/2021   Chronic venous insufficiency 08/01/2021   Knee osteoarthritis 12/12/2020   Depression 09/02/2020   Left paraspinal back pain 05/05/2020   Hearing difficulty of both ears 05/05/2020   Internal hemorrhoid 01/11/2020   Fibroid, uterine 11/07/2019   Bleeding from the genitourinary system 09/13/2019   Urinary incontinence 12/12/2018   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[place])     Right ovarian cyst 05/19/2017   Rash 10/31/2014   Peripheral neuropathy ([place]) 09/27/2014   Healthcare maintenance 09/27/2014   Atrial flutter ([place]) 08/17/2012   Right heart failure, NYHA class 3 ([place]) 03/15/2012   Allergic rhinitis 11/16/2011   Diabetes ([place]) 10/15/2010   Hyperlipidemia 10/15/2010   Obstructive sleep apnea 02/21/2009   GERD 11/29/2005    Conditions to be addressed/monitored per PCP order:   community resources  Care Plan : RN Care Manager Plan of Care  Updates made by Ethelda Chick since 11/18/2021 12:00 AM     Problem: Health Management needs related to Lymphedema      Long-Range Goal: Development of Plan of Care to address Health Management needs related to Lymphedema   Start Date: 11/16/2021  Expected End Date: 02/14/2022  Note:   Current Barriers:  Care Coordination needs related to Level of care concerns  Chronic Disease Management support and education needs related to Lymphedema  Ms. Mellott would like to apply for Medicare if appropriate. She has lost her address book and would like addresses to her frequently visited locations.  RNCM Clinical Goal(s):  Patient will verbalize understanding of plan for management of Lymphedema as evidenced by patient reports take all medications exactly as prescribed and will call provider for medication related questions as evidenced by patient reports    attend all scheduled medical appointments: 12/03/21 at [facility] and 12/04/21 for Mammogram as evidenced by documentation in EMR        work with social worker to address Level of care concerns related to the management of Lymphedema as evidenced by review of EMR and patient or social worker report        Interventions: Inter-disciplinary care team collaboration (see longitudinal plan of care) Evaluation of current treatment plan related to  self management and patient's adherence to plan as established by provider BSW completed a telephone outreach with patient.  She receives 914 in disability each month and receives foodstamps. Patient states she recently found out she does not qualify for social security, patient stated she received an ad for a food card but she has to have Medicare. BSW and patient discussed patient applying for Medicare but would have to pay towards it due to her being 73 with a disability. Patient stated the only problem she is having right now is with getting food. BSW encouraged patient to use her foodstamps at the farmers market and sent patient a list of food pantries.   Lymphedema  (Status: New goal.) Long Term Goal  Evaluation of current treatment plan related to  Lymphedema ,  self-management and patient's adherence to plan as established by  provider. Discussed plans with patient for ongoing care management follow up and provided patient with direct contact information for care management team Reviewed medications with patient and discussed having needed prescription transferred to her current pharmacy; Collaborated with BSW regarding assistance with applying for Medicare if appropriate; Reviewed scheduled/upcoming provider appointments including 12/03/21 for [facility] and 12/04/21 for Mammography; Assessed social determinant of health barriers;  Provided patient with addresses to requested locations to assist her with utilizing [employer] transportation Advised patient to contact PCP office to determine if she needs to keep appointments on 11/19/21 and 11/25/21-patient reports per PCP, she did not need these appointments   Patient Goals/Self-Care Activities: Take medications as prescribed   Attend all scheduled provider appointments Call provider office for new concerns or questions  Work with the social worker to address care coordination needs and will continue to work with the clinical team to address health care and disease management related needs       Follow up:  Patient agrees to Care Plan and Follow-up.  Plan: The  Managed Medicaid care management team will reach out to the patient again over the next 14 days.  Date/time of next scheduled Social Work care management/care coordination outreach:  12/08/21  Mickel Fuchs, Arita Miss, [place] Medicaid Team  [phone]

## 2021-11-18 NOTE — Patient Instructions (Signed)
Visit Information  Ms. Villari was given information about Medicaid Managed Care team care coordination services as a part of their Healthy [facility] Medicaid benefit. [name] verbally consented to engagement with the [facility] Managed Care team.   If you are experiencing a medical emergency, please call 911 or report to your local emergency department or urgent care.   If you have a non-emergency medical problem during routine business hours, please contact your provider's office and ask to speak with a nurse.   For questions related to your Healthy [facility] health plan, please call: [phone] or visit the homepage here: [email or web address]  If you would like to schedule transportation through your Healthy [facility] plan, please call the following number at least 2 days in advance of your appointment: [phone]  For information about your ride after you set it up, call Ride Assist at [phone]. Use this number to activate a Will Call pickup, or if your transportation is late for a scheduled pickup. Use this number, too, if you need to make a change or cancel a previously scheduled reservation.  If you need transportation services right away, call [phone]. The after-hours call center is staffed 24 hours to handle ride assistance and urgent reservation requests (including discharges) 365 days a year. Urgent trips include sick visits, hospital discharge requests and life-sustaining treatment.  Call the [place] at [phone], at any time, 24 hours a day, 7 days a week. If you are in danger or need immediate medical attention call 911.  If you would like help to quit smoking, call 1-800-QUIT-NOW (773)857-9297) OR Espaol: 1-855-Djelo-Ya (6-269-485-4627) o para ms informacin haga clic aqu or Text READY to 200-400 to register via text  [name] - following are the goals we discussed in your visit today:   Goals  Addressed   None     Social Worker will follow up on 12/08/21.   Mickel Fuchs, BSW, [place]  High Risk Managed Medicaid Team  [phone]   Following is a copy of your plan of care:  Care Plan : [place] of Care  Updates made by Ethelda Chick since 11/18/2021 12:00 AM     Problem: Health Management needs related to Lymphedema      Long-Range Goal: Development of Plan of Care to address Health Management needs related to Lymphedema   Start Date: 11/16/2021  Expected End Date: 02/14/2022  Note:   Current Barriers:  Care Coordination needs related to Level of care concerns  Chronic Disease Management support and education needs related to Lymphedema  Ms. Cerniglia would like to apply for Medicare if appropriate. She has lost her address book and would like addresses to her frequently visited locations.  RNCM Clinical Goal(s):  Patient will verbalize understanding of plan for management of Lymphedema as evidenced by patient reports take all medications exactly as prescribed and will call provider for medication related questions as evidenced by patient reports    attend all scheduled medical appointments: 12/03/21 at [facility] and 12/04/21 for Mammogram as evidenced by documentation in EMR        work with social worker to address Level of care concerns related to the management of Lymphedema as evidenced by review of EMR and patient or social worker report        Interventions: Inter-disciplinary care team collaboration (see longitudinal plan of care) Evaluation of current treatment plan related to  self management and patient's adherence  to plan as established by provider BSW completed a telephone outreach with patient. She receives 914 in disability each month and receives foodstamps. Patient states she recently found out she does not qualify for social security, patient stated she received an ad for a food card but she has to  have Medicare. BSW and patient discussed patient applying for Medicare but would have to pay towards it due to her being 10 with a disability. Patient stated the only problem she is having right now is with getting food. BSW encouraged patient to use her foodstamps at the farmers market and sent patient a list of food pantries.   Lymphedema  (Status: New goal.) Long Term Goal  Evaluation of current treatment plan related to  Lymphedema ,  self-management and patient's adherence to plan as established by provider. Discussed plans with patient for ongoing care management follow up and provided patient with direct contact information for care management team Reviewed medications with patient and discussed having needed prescription transferred to her current pharmacy; Collaborated with BSW regarding assistance with applying for Medicare if appropriate; Reviewed scheduled/upcoming provider appointments including 12/03/21 for [facility] Clinic and 12/04/21 for Mammography; Assessed social determinant of health barriers;  Provided patient with addresses to requested locations to assist her with utilizing [employer] transportation Advised patient to contact PCP office to determine if she needs to keep appointments on 11/19/21 and 11/25/21-patient reports per PCP, she did not need these appointments   Patient Goals/Self-Care Activities: Take medications as prescribed   Attend all scheduled provider appointments Call provider office for new concerns or questions  Work with the social worker to address care coordination needs and will continue to work with the clinical team to address health care and disease management related needs

## 2021-11-19 ENCOUNTER — Encounter: Payer: Self-pay | Admitting: Internal Medicine

## 2021-11-25 ENCOUNTER — Encounter: Payer: Medicaid Other | Admitting: Internal Medicine

## 2021-12-02 ENCOUNTER — Encounter: Payer: Medicaid Other | Admitting: Internal Medicine

## 2021-12-03 ENCOUNTER — Ambulatory Visit: Payer: Medicaid Other | Admitting: Occupational Therapy

## 2021-12-04 ENCOUNTER — Ambulatory Visit
Admission: RE | Admit: 2021-12-04 | Discharge: 2021-12-04 | Disposition: A | Discharge status: Home / Self Care | Payer: Medicaid Other | Source: Ambulatory Visit | Attending: Internal Medicine | Admitting: Internal Medicine

## 2021-12-04 DIAGNOSIS — G4730 Sleep apnea, unspecified: Secondary | ICD-10-CM | POA: Diagnosis not present

## 2021-12-04 DIAGNOSIS — J984 Other disorders of lung: Secondary | ICD-10-CM | POA: Diagnosis not present

## 2021-12-04 DIAGNOSIS — Z1231 Encounter for screening mammogram for malignant neoplasm of breast: Secondary | ICD-10-CM | POA: Diagnosis not present

## 2021-12-04 DIAGNOSIS — I270 Primary pulmonary hypertension: Secondary | ICD-10-CM | POA: Diagnosis not present

## 2021-12-04 DIAGNOSIS — Z0000 Encounter for general adult medical examination without abnormal findings: Secondary | ICD-10-CM

## 2021-12-04 DIAGNOSIS — I5030 Unspecified diastolic (congestive) heart failure: Secondary | ICD-10-CM | POA: Diagnosis not present

## 2021-12-07 ENCOUNTER — Other Ambulatory Visit: Payer: Medicaid Other | Admitting: *Deleted

## 2021-12-07 ENCOUNTER — Telehealth: Payer: Self-pay

## 2021-12-07 NOTE — Telephone Encounter (Signed)
I contacted [name] to see which [place] she uses.I have faxed [employer],office note and DME order to them.The fax number is [phone].The phone number is [phone] Silverio Decamp C12/4/20234:23 PM

## 2021-12-07 NOTE — Patient Outreach (Signed)
  Medicaid Managed Care   Unsuccessful Attempt Note   12/07/2021 Name: [name] MRN: [id number] DOB: [birth year]  Referred by: Masters, [name], DO Reason for referral : High Risk Managed Medicaid (Unsuccessful RNCM follow up telephone outreach)   An unsuccessful telephone outreach was attempted today. The patient was referred to the case management team for assistance with care management and care coordination.    Follow Up Plan: A HIPAA compliant phone message was left for the patient providing contact information and requesting a return call. and The Managed Medicaid care management team will reach out to the patient again over the next 14 days.    [name] RN, BSN [place]  [employer] manager

## 2021-12-07 NOTE — Patient Instructions (Signed)
Visit Information  Ms. LORELAI HUYSER  - as a part of your Medicaid benefit, you are eligible for care management and care coordination services at no cost or copay. I was unable to reach you by phone today but would be happy to help you with your health related needs. Please feel free to call me @ [phone].   A member of the Managed Medicaid care management team will reach out to you again over the next 14 days.   [name] RN, BSN [place]  [employer] manager

## 2021-12-08 ENCOUNTER — Ambulatory Visit
Payer: Medicaid Other | Attending: Student in an Organized Health Care Education/Training Program | Admitting: Occupational Therapy

## 2021-12-08 ENCOUNTER — Encounter: Payer: Self-pay | Admitting: Occupational Therapy

## 2021-12-08 DIAGNOSIS — I890 Lymphedema, not elsewhere classified: Secondary | ICD-10-CM | POA: Insufficient documentation

## 2021-12-08 DIAGNOSIS — I872 Venous insufficiency (chronic) (peripheral): Secondary | ICD-10-CM | POA: Diagnosis not present

## 2021-12-08 NOTE — Therapy (Incomplete)
OUTPATIENT OCCUPATIONALTHERAPY EVALUATION LOWER EXTREMITY LYMPHEDEMA  Patient Name: [name] MRN: [id number] DOB:Jan 26, 1960, 61 y.o., female Today's Date: 12/08/2021  END OF SESSION:  OT End of Session - 12/08/21 1316     OT Start Time 0120    Activity Tolerance Patient tolerated treatment well;No increased pain    Behavior During Therapy [facility] for tasks assessed/performed               Past Medical History:  Diagnosis Date   Acute on chronic diastolic (congestive) heart failure ([place]) 06/25/2013   COPD (chronic obstructive pulmonary disease) (HCC)    Cor pulmonale (HCC)    PA Peak pressure 71mHg   Depression    Diabetes mellitus    well controlled on metformin   External hemorrhoid 01/11/2020   GERD (gastroesophageal reflux disease)    H/O mental retardation    Hyperlipidemia    Hypertension    OSA (obstructive sleep apnea)    CPAP   Pulmonary hypertension (HCC)    Restrictive lung disease    PFTs 06/2012 (FVC 54% predicted and FEV1 68% predicted w minimal bronchodilator response).   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 ulcer ([place]    [place]orn[place], ?followed up at wound care center, multiple courses of antibiotics in past for cellulitis, on lasix   Past Surgical History:  Procedure Laterality Date   CARDIAC CATHETERIZATION N/A 01/13/2016   Procedure: Right Heart Cath;  Surgeon: DJolaine Artist MD;  [facility];  Service: Cardiovascular;  Laterality: N/A;   CHOLECYSTECTOMY     COLONOSCOPY WITH PROPOFOL N/A 12/20/2019   Procedure: COLONOSCOPY WITH PROPOFOL;  Surgeon: JMilus Banister MD;  Location: [facility];  Service: Endoscopy;  Laterality: N/A;   IR FLUORO GUIDE CV LINE LEFT  11/16/2019   IR FLUORO GUIDE CV LINE LEFT  06/17/2020   IR FLUORO GUIDE CV LINE LEFT  07/04/2020   IR FLUORO GUIDE CV LINE LEFT  07/03/2021   IR FLUORO GUIDE CV LINE RIGHT   05/09/2019   IR FLUORO GUIDE CV LINE RIGHT  08/12/2019   IR REMOVAL TUN CV CATH W/O FL  05/05/2019   IR [place] ACCESS LEFT  11/16/2019   IR [place] V[place] ACCESS LEFT  06/17/2020   IR [place] VASC ACCESS LEFT  07/04/2020   IR [place] VASC ACCESS LEFT  07/03/2021   IR [place] VASC ACCESS RIGHT  05/09/2019   IR [place] VASC ACCESS RIGHT  08/12/2019   IR [place] VASC ACCESS RIGHT  11/16/2019   IR VENO/JUGULAR RIGHT  11/16/2019   LEFT AND RIGHT HEART CATHETERIZATION WITH CORONARY ANGIOGRAM N/A 02/28/2014   Procedure: LEFT AND RIGHT HEART CATHETERIZATION WITH CORONARY ANGIOGRAM;  Surgeon: DJolaine Artist MD;  [facility];  Service: Cardiovascular;  Laterality: N/A;   RIGHT HEART CATH N/A 06/27/2018   Procedure: RIGHT HEART CATH;  Surgeon: BJolaine Artist MD;  [facility];  Service: Cardiovascular;  Laterality: N/A;   RIGHT/LEFT HEART CATH AND CORONARY ANGIOGRAPHY N/A 05/14/2019   Procedure: RIGHT/LEFT HEART CATH AND CORONARY ANGIOGRAPHY;  Surgeon: BJolaine Artist MD;  [facility];  Service: Cardiovascular;  Laterality: N/A;   Patient Active Problem List   Diagnosis Date Noted   Maceration of skin 10/10/2021   Chronic venous insufficiency 08/01/2021   Knee osteoarthritis 12/12/2020  Depression 09/02/2020   Left paraspinal back pain 05/05/2020   Hearing difficulty of both ears 05/05/2020   Internal hemorrhoid 01/11/2020   Fibroid, uterine 11/07/2019   Bleeding from the genitourinary system 09/13/2019   Urinary incontinence 12/12/2018   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[place])    Right ovarian cyst 05/19/2017   Rash 10/31/2014   Peripheral neuropathy ([place]) 09/27/2014   Healthcare maintenance 09/27/2014   Atrial flutter ([place]) 08/17/2012   Right heart failure, NYHA class 3 ([place]) 03/15/2012   Allergic rhinitis 11/16/2011   Diabetes ([place]) 10/15/2010   Hyperlipidemia 10/15/2010   Obstructive sleep apnea  02/21/2009   GERD 11/29/2005    PCP: ***  REFERRING PROVIDER: ***  REFERRING DIAG: ***  THERAPY DIAG:  Lymphedema, not elsewhere classified  Rationale for Evaluation and Treatment: Rehabilitation  ONSET DATE: 12/08/21  SUBJECTIVE:                                                                                                                                                                                           SUBJECTIVE STATEMENT:  PERTINENT HISTORY:   PAIN:  Are you having pain? {OPRCPAIN:27236}  PRECAUTIONS: {Therapy precautions:24002}  WEIGHT BEARING RESTRICTIONS: {Yes ***/No:24003}  FALLS:  Has patient fallen in last 6 months? {fallsyesno:27318}  LIVING ENVIRONMENT: Lives with: {OPRC lives with:25569::"lives with their family"} Lives in: {Lives in:25570} Stairs: {yes/no:20286}; {Stairs:24000} Has following equipment at home: {Assistive devices:23999}  OCCUPATION: ***  LEISURE: ***  HAND DOMINANCE: {RIGHT/LEFT:21944}   PRIOR LEVEL OF FUNCTION: {PLOF:24004}  PATIENT GOALS: ***   OBJECTIVE:  COGNITION:  Overall cognitive status: {cognition:24006}   PALPATION: ***  OBSERVATIONS / OTHER ASSESSMENTS: ***  SENSATION: {sensation:27233}  POSTURE: ***  LE ROM:   {AROM/PROM:27142}  Right 12/08/2021 LEFT 12/08/2021  Hip flexion    Hip extension    Hip abduction    Hip adduction    Hip internal rotation    Hip external rotation    Knee flexion    Knee extension    Ankle dorsiflexion    Ankle plantarflexion    Ankle inversion    Ankle eversion     (Blank rows = not tested)  LE MMT:   MMT Right 12/08/2021 Left 12/08/2021  Hip flexion    Hip extension    Hip abduction    Hip adduction    Hip internal rotation    Hip external rotation    Knee flexion    Knee extension    Ankle dorsiflexion    Ankle plantarflexion    Ankle inversion    Ankle eversion     (Blank rows = not tested)  LYMPHEDEMA ASSESSMENTS:   SURGERY TYPE/DATE:  ***  NUMBER OF LYMPH NODES REMOVED: ***  CHEMOTHERAPY: ***  RADIATION:***  HORMONE TREATMENT: ***  INFECTIONS: ***   LYMPHEDEMA ASSESSMENTS:   LANDMARK RIGHT  12/08/2021  10 cm proximal to olecranon process   Olecranon process   10 cm proximal to ulnar styloid process   Just proximal to ulnar styloid process   Across hand at thumb web space   At base of 2nd digit   (Blank rows = not tested)  LANDMARK LEFT  12/08/2021  10 cm proximal to olecranon process   Olecranon process   10 cm proximal to ulnar styloid process   Just proximal to ulnar styloid process   Across hand at thumb web space   At base of 2nd digit   (Blank rows = not tested)   LE LANDMARK RIGHT 12/08/2021  At groin   30 cm proximal to suprapatella   20 cm proximal to suprapatella   10 cm proximal to suprapatella   At midpatella / popliteal crease   30 cm proximal to floor at lateral plantar foot   20 cm proximal to floor at lateral plantar foot   10 cm proximal to floor at lateral plantar foot   Circumference of ankle/heel   5 cm proximal to 1st MTP joint   Across MTP joint   Around proximal great toe   (Blank rows = not tested)  LE LANDMARK LEFT 12/08/2021  At groin   30 cm proximal to suprapatella   20 cm proximal to suprapatella   10 cm proximal to suprapatella   At midpatella / popliteal crease   30 cm proximal to floor at lateral plantar foot   20 cm proximal to floor at lateral plantar foot   10 cm proximal to floor at lateral plantar foot   Circumference of ankle/heel   5 cm proximal to 1st MTP joint   Across MTP joint   Around proximal great toe   (Blank rows = not tested)  FUNCTIONAL TESTS:  {Functional tests:24029}  GAIT: Distance walked: *** Assistive device utilized: {Assistive devices:23999} Level of assistance: {Levels of assistance:24026} Comments: ***  LYMPHEDEMA LIFE IMPACT SCALE (LLIS):  I. Physical Concerns The amount of pain associated with my lymphedema is:  {LLISSELECTION:27229} The amount of limb heaviness associated with my lymphedema is: {LLISSELECTION:27229} The amount of skin tightness associated with my lymphedema is: {LLISSELECTION:27229} In comparison to my unaffected limb, the size of my swollen limb seems: {LLISSELECTION:27229} In comparison to my unaffected limb, the texture of my swollen limb feels: {LLISSELECTION:27229} Lymphedema affects movement of my swollen limb: {LLISSELECTION:27229} The strength in my swollen limb compared with the unaffected limb is: {LLISSELECTION:27229} How often have you become ill wit an infection in your swollen limb requiring oral antibiotics or hospitalization in the past 2 YEARS? {LLISSELECTION:27229}  II. Psychosocial Concerns 9.   Lymphedema affects my body image (ie. "How I think I look"): {LLISSELECTION:27229} 10. Lymphedema affects my socializing with others: {LLISSELECTION:27229} 11. Lymphedema affects my intimate relations: {LLISSELECTION:27229} 12. Lymphedema "gets me down" (ie. I have feelings of depression, frustration, or anger due to the lymphedema): {LLISSELECTION:27229}  III. Functional Concerns 13. Lymphedema affects my ability to perform duties at home: {LLISSELECTION:27229} 14. Lymphedema affects my ability to perform dueites at work (if applicable): {PYPPJKDTOIZTI:45809} 15. Lymphedema affects my performance of preferred recreational activities: {LLISSELECTION:27229} 16. Lymphedema affects the proper fit of clothing/shoes: {LLISSELECTION:27229} 17. Lymphedema affects my sleep: {LLISSELECTION:27229} 18. I must rely on others for help due  to my lymphedema: {LLISSELECTION:27229}  TODAY'S TREATMENT:                                                                                                                                         DATE: ***  PATIENT EDUCATION:  Education details: *** Person educated: {Person educated:25204} Education method: {Education Method:25205} Education  comprehension: {Education Comprehension:25206}  HOME EXERCISE PROGRAM: ***  ASSESSMENT:  CLINICAL IMPRESSION: Patient is a *** y.o. *** who was seen today for physical therapy evaluation and treatment for ***.   OBJECTIVE IMPAIRMENTS: {opptimpairments:25111}.   ACTIVITY LIMITATIONS: {activitylimitations:27494}  PARTICIPATION LIMITATIONS: {participationrestrictions:25113}  PERSONAL FACTORS: {Personal factors:25162} are also affecting patient's functional outcome.   REHAB POTENTIAL: {rehabpotential:25112}  CLINICAL DECISION MAKING: {clinical decision making:25114}  EVALUATION COMPLEXITY: {Evaluation complexity:25115}   GOALS: Goals reviewed with patient? {yes/no:20286}  SHORT TERM GOALS: Target date: ***  *** Baseline: Goal status: {GOALSTATUS:25110}  2.  *** Baseline:  Goal status: {GOALSTATUS:25110}  3.  *** Baseline:  Goal status: {GOALSTATUS:25110}  4.  *** Baseline:  Goal status: {GOALSTATUS:25110}  5.  *** Baseline:  Goal status: {GOALSTATUS:25110}  6.  *** Baseline:  Goal status: {GOALSTATUS:25110}  LONG TERM GOALS: Target date: ***  *** Baseline:  Goal status: {GOALSTATUS:25110}  2.  *** Baseline:  Goal status: {GOALSTATUS:25110}  3.  *** Baseline:  Goal status: {GOALSTATUS:25110}  4.  *** Baseline:  Goal status: {GOALSTATUS:25110}  5.  *** Baseline:  Goal status: {GOALSTATUS:25110}  6.  *** Baseline:  Goal status: {GOALSTATUS:25110}   PLAN:  PT FREQUENCY: {rehab frequency:25116}  PT DURATION: {rehab duration:25117}  PLANNED INTERVENTIONS: {rehab planned interventions:25118::"Therapeutic exercises","Therapeutic activity","Neuromuscular re-education","Balance training","Gait training","Patient/Family education","Self Care","Joint mobilization"}  PLAN FOR NEXT SESSION: ***   Ansel Bong, OT 12/08/2021, 1:37 PM

## 2021-12-09 ENCOUNTER — Encounter: Payer: Self-pay | Admitting: Internal Medicine

## 2021-12-10 ENCOUNTER — Encounter: Payer: Self-pay | Admitting: Occupational Therapy

## 2021-12-10 NOTE — Therapy (Signed)
OUTPATIENT OCCUPATIONAL THERAPY EVALUATION LOWER EXTREMITY LYMPHEDEMA   Patient Name: [name] MRN: [id number] DOB:[birth year], 61 y.o., female Today's Date: 12/10/2021  END OF SESSION:  OT End of Session - 12/10/21 0932     Visit Number 1    Number of Visits 36    Date for OT Re-Evaluation 03/10/22    OT Start Time 0120    OT Stop Time 0211    OT Time Calculation (min) 51 min    Activity Tolerance Patient tolerated treatment well;No increased pain    Behavior During Therapy [facility] for tasks assessed/performed               Past Medical History:  Diagnosis Date   Acute on chronic diastolic (congestive) heart failure ([place]) 06/25/2013   COPD (chronic obstructive pulmonary disease) (HCC)    Cor pulmonale (HCC)    PA Peak pressure 70mHg   Depression    Diabetes mellitus    well controlled on metformin   External hemorrhoid 01/11/2020   GERD (gastroesophageal reflux disease)    H/O mental retardation    Hyperlipidemia    Hypertension    OSA (obstructive sleep apnea)    CPAP   Pulmonary hypertension (HCC)    Restrictive lung disease    PFTs 06/2012 (FVC 54% predicted and FEV1 68% predicted w minimal bronchodilator response).   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    [place], ?followed up at wound care center, multiple courses of antibiotics in past for cellulitis, on lasix   Past Surgical History:  Procedure Laterality Date   CARDIAC CATHETERIZATION N/A 01/13/2016   Procedure: Right Heart Cath;  Surgeon: DJolaine Artist MD;  [facility];  Service: Cardiovascular;  Laterality: N/A;   CHOLECYSTECTOMY     COLONOSCOPY WITH PROPOFOL N/A 12/20/2019   Procedure: COLONOSCOPY WITH PROPOFOL;  Surgeon: JMilus Banister MD;  Location: [facility];  Service: Endoscopy;  Laterality: N/A;   IR FLUORO GUIDE CV LINE LEFT  11/16/2019   IR FLUORO GUIDE  CV LINE LEFT  06/17/2020   IR FLUORO GUIDE CV LINE LEFT  07/04/2020   IR FLUORO GUIDE CV LINE LEFT  07/03/2021   IR FLUORO GUIDE CV LINE RIGHT  05/09/2019   IR FLUORO GUIDE CV LINE RIGHT  08/12/2019   IR REMOVAL TUN CV CATH W/O FL  05/05/2019   IR [place] ACCESS LEFT  11/16/2019   IR [place] VASC ACCESS LEFT  06/17/2020   IR [place] VASC ACCESS LEFT  07/04/2020   IR [place] VASC ACCESS LEFT  07/03/2021   IR [place] VASC ACCESS RIGHT  05/09/2019   IR [place] VASC ACCESS RIGHT  08/12/2019   IR [place] VASC ACCESS RIGHT  11/16/2019   IR VENO/JUGULAR RIGHT  11/16/2019   LEFT AND RIGHT HEART CATHETERIZATION WITH CORONARY ANGIOGRAM N/A 02/28/2014   Procedure: LEFT AND RIGHT HEART CATHETERIZATION WITH CORONARY ANGIOGRAM;  Surgeon: DJolaine Artist MD;  [facility];  Service: Cardiovascular;  Laterality: N/A;   RIGHT HEART CATH N/A 06/27/2018   Procedure: RIGHT HEART CATH;  Surgeon: BJolaine Artist MD;  [facility];  Service: Cardiovascular;  Laterality: N/A;   RIGHT/LEFT HEART CATH AND CORONARY ANGIOGRAPHY N/A 05/14/2019   Procedure: RIGHT/LEFT HEART CATH AND CORONARY ANGIOGRAPHY;  Surgeon: BJolaine Artist MD;  [facility];  Service: Cardiovascular;  Laterality: N/A;   Patient Active Problem List   Diagnosis Date Noted   Maceration of skin 10/10/2021   Chronic venous insufficiency 08/01/2021   Knee osteoarthritis 12/12/2020   Depression 09/02/2020   Left paraspinal back pain 05/05/2020   Hearing difficulty of both ears 05/05/2020   Internal hemorrhoid 01/11/2020   Fibroid, uterine 11/07/2019   Bleeding from the genitourinary system 09/13/2019   Urinary incontinence 12/12/2018   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[place])    Right ovarian cyst 05/19/2017   Rash 10/31/2014   Peripheral neuropathy ([place]) 09/27/2014   Healthcare maintenance 09/27/2014   Atrial flutter ([place]) 08/17/2012   Right heart failure, NYHA class  3 ([place]) 03/15/2012   Allergic rhinitis 11/16/2011   Diabetes ([place]) 10/15/2010   Hyperlipidemia 10/15/2010   Obstructive sleep apnea 02/21/2009   GERD 11/29/2005    PCP: Christiana Fuchs, DO  REFERRING PROVIDER: [name], MD  REFERRING DIAG: I89.0   THERAPY DIAG:  Lymphedema, not elsewhere classified  Rationale for Evaluation and Treatment: Rehabilitation  ONSET DATE: 12/08/21  SUBJECTIVE:                                                                                                                                                                                           SUBJECTIVE STATEMENT: [name] is referred to Occupational Therapy by [name], MD for evaluation and treatment of long standing, BLE lymphedema. Pt previously underwent Complete Decongestive Therapy (CDT) from 01/07/21 to 05/15/21 at the [facility]. Pt partially met some goals. She reports she never picked up adjustable, Velcro wrap style compression leggings due to cost. Pt had a hx of limited attendance due to transportation issues.Pt does not currently use compression. She states she cannot get them on and they hurt. Pt doesn't know if family hx includes limb swelling. Pt's goals for therapy are to get the swelling under control and to limit skin breakdown/ wounds.  PERTINENT HISTORY: DM, HTN, CHF, OSA, Mental illness, COPD, OA knees, Hx stasis ulcer, Atrial Flutter; obesity, stage III CKD, Pulmonary HTN  FUNCTIONAL LIMITATIONS: Gait abnormality,  impaired functional ambulation, transfers and mobility, HOH, decreased standing and walking tolerance, impaired balance, urinary incontinence, OA knee pain and decreased AROM B knees and ankles, chronic leg swelling with associated pain, difficulty fitting street shoes and LB clothing, difficulty reaching feet to bathe, inspect skin, groom nails and perform skin care, difficulty managing transportation and  operating a cell phone, difficulty shopping, with meal prep and housekeeping  PAIN:  Are you having pain? No  PRECAUTIONS: Other: LYMPHEDEMA  PRECAUTIONS ; Hx venous ulcer ; DM, Falls  WEIGHT BEARING RESTRICTIONS: No  FALLS:  Has patient fallen in last 6 months? No  LIVING ENVIRONMENT: Lives with: lives alone Lives in: House/apartment  OCCUPATION: Disability  LEISURE: friends  HAND DOMINANCE: right   PRIOR LEVEL OF FUNCTION: Independent with basic ADLs and Independent with household mobility without device  PATIENT GOALS: reduce leg swelling and limit skin breakdown   OBJECTIVE:  COGNITION:  Overall cognitive status: History of cognitive impairments - at baseline   OBSERVATIONS / OTHER ASSESSMENTS:  SKIN CONDITION:   MODERATE, Stage  II, Bilateral Lower Extremity Lymphedema 2/2 suspected CVI and Obesity  Skin  Description Hyper-Keratosis Peau' de Orange Shiny Tight Fibrotic/ Indurated Fatty Doughy Spongy/ boggy   x x  x X L>R x     Skin dry Flaky WNL Macerated   mildly      Color Redness Varicosities Blanching Hemosiderin Stain Mottled   x  x x  x   Odor Malodorous Yeast Fungal infection  WNL      x   Temperature Warm Cool WNL    x     Pitting Edema   1+ 2+ 3+ 4+ Non-pitting         x   Girth Symmetrical Asymmetrical                   Distribution    L>R toes to groin, but concentrated below knees, L>R    Stemmer Sign Positive Negative   +    Lymphorrhea History Of:  Present Absent   x  x    Wounds History Of Present Absent Venous Arterial Pressure Sheer   x  x x       Signs of Infection Redness Warmth Erythema Acute Swelling Drainage Borders   x                 Sensation Light Touch Deep pressure Hypersensitivity   Present Impaired Present Impaired Absent Impaired   x x x  x     Nails WNL   Fungus nail dystrophy   x     Hair Growth Symmetrical Asymmetrical   x    Skin Creases Base of toes  Ankles   Base of Fingers  knees       Abdominal pannus Thigh Lobules  Face/neck   x x  x x       POSTURE: head forward , mild cervical kyphosis  LE ROM: limited knee  flexion bilaterally  2/2 swelling related tissue approximation; limited ankle AROM 2/2 limited skin excursion  LE MMT: WNL for tasks required  LYMPHEDEMA ASSESSMENTS:   SURGERY TYPE/DATE: non-cancer related lymphedema  INFECTIONS: Hx of LE cellulitis treated with oral antibiotics   LYMPHEDEMA ASSESSMENTS:   BLE COMPARATIVE LIMB VOLUMETRICS: TBA OT RX visit 1  LANDMARK RIGHT  08/26/21  R LEG (A-D) N/A  R THIGH (E-G) ml  R FULL LIMB (A-G) ml  Limb Volume differential (LVD)  %  Volume change since initial %  Volume change overall V  (Blank rows = not tested)  LANDMARK LEFT  08/26/21  R LEG (A-D) N/A  R THIGH (E-G) ml  R FULL LIMB (A-G) ml  Limb Volume differential (LVD)  %  Volume change since initial %  Volume change overall %  (Blank rows = not tested)   GAIT: Distance walked: >500' Assistive device utilized: Environmental consultant - 2 wheeled Level of assistance: Modified independence Comments: walks quickly   with  head forward , shoulders elevated and with knees slightly bent outside of walker  TODAY'S TREATMENT:                                                                                                                                         DATE: 12/10/21  PATIENT EDUCATION:  Education details: Provided Pt/ family education regarding lymphatic structure and function, lymphedema etiology, onset patterns and stages of progression. Taught interaction between blood circulatory system and lymphatic circulation.Discussed  impact of gravity and co-morbidities on lymphatic function. Outlined Complete Decongestive Therapy (CDT)  as standard of care and provided in depth information regarding 4 primary components of Intensive and Self Management Phases, including Manual Lymph Drainage (MLD), compression wrapping and garments, skin care, and  therapeutic exercise. Pilar Plate discussion with re need for frequent attendance and high burden of care when caregiver is needed, impact of co morbidities. We discussed  the chronic, progressive nature of lymphedema and Importance of daily, ongoing LE self-care essential for limiting progression and infection risk. Discussed the demanding nature of CDT and importance of high compliance with all home program components for optimal outcome.  Person educated: Patient Education method: Explanation, Demonstration, Tactile cues, Verbal cues, and Handouts Education comprehension: verbalized understanding, returned demonstration, verbal cues required, and needs further education  LYMPHEDEMA SELF-CARE HOME PROGRAM:(Intensive and self-management phases of CDT) 1.Simple Self-MLD to BLE/BLQ daily 2. Compression: short stretch multi layer wraps on one leg at a time only during Intensive Phase CDT; Fit with appropriate compression garments during self-management phase. 3. Daily skin care 4. Lymphatic pumping there ex- 1 x daily, 10 reps of each, in order  ASSESSMENT:  CLINICAL IMPRESSION: [name] is a 10 y. o. female presenting with moderate, stage II, BLE 2/2 suspected CVI, obesity and multiple systemic co-morbidities contributing to lymphatic overload, including CHF, HTN, OSA, chronic arthritis inflammation, kidney disease, hx mental illness, slow healing venous ulcer and recurrent cellulitis. Swelling is concentrated below the knees, L>R. Edema presents as non- pitting, hard and dense with induration and distended pores. Stemmer sign is strongly positive bilaterally. Skin is cool and reddened with venous stasis changes, including redness, hemosiderin staining, skin creases and hyperkeratosis. LE-related worsens with all functional activities requiring standing, walking and/ or dependent sitting > 10 minutes. Chronic, progressive LE limits [name]'s functional performance in all occupational domains,  including functional ambulation and mobility / transfers, basic and instrumental ADLs, leisure pursuits, productive activities, and social participation. [name] will benefit from skilled Occupational Therapy for Intensive and Management Phase Complete Decongestive Therapy (CDT) to include manual lymphatic drainage (MLD), skin care, therapeutic exercise, and compression therapies. CDT is medically necessary to reduce limb swelling and associated pain, to limit progression of the condition and to reduce infection risk. Without skilled OT for CDT LE will progress and further functional decline is expected.   OBJECTIVE IMPAIRMENTS: Abnormal gait, decreased balance, decreased  cognition, decreased knowledge of condition, decreased knowledge of use of DME, decreased mobility, decreased ROM, increased edema, impaired flexibility, impaired sensation, obesity, and pain.   ACTIVITY LIMITATIONS: sitting, standing, squatting, stairs, transfers, bed mobility, continence, bathing, toileting, and dressing  PARTICIPATION LIMITATIONS: meal prep, cleaning, laundry, driving, shopping, community activity, yard work, and Social participation limited due to increased lag pain and swelling with standing , walking, and or dependent sitting > 15 min.  PERSONAL FACTORS: Age, Behavior pattern, Time since onset of injury/illness/exacerbation, Transportation, 3+ comorbidities:  , and hx of poor attendenace and non compliance with previous  CDT  are also affecting patient's functional outcome.   REHAB POTENTIAL: Good  EVALUATION COMPLEXITY: Moderate   GOALS: Goals reviewed with patient? Yes  SHORT TERM GOALS: Target date: 4th OT Rx visit   Pt will demonstrate understanding of lymphedema precautions and prevention strategies with modified independence using a printed reference to identify at least 5 precautions and discussing how s/he may implement them into daily life to reduce risk of progression with min A. Baseline: Max  A Goal status: INITIAL  2.   With Max caregiver assistance  x 1 Pt will be able to apply multilayer, knee length, compression wraps using gradient techniques to decrease limb volume, to limit infection risk, and to limit lymphedema progression.  Baseline: Dependent Goal status: INITIAL  LONG TERM GOALS: Target date: 03/08/22  Given this patient's Intake score of 44.12% on the Lymphedema Life Impact Scale (LLIS), patient will experience a reduction of at least 5% in her perceived level of functional impairment resulting from lymphedema to improve functional performance and quality of life (QOL). Baseline: 44.12% Goal status: INITIAL  2.  Given this patient's Intake score of TBA/100% on the functional outcomes FOTO tool, patient will experience an increase in function of 5 points to improve basic and instrumental ADLs performance, including lymphedema self-care. Baseline:  (TBA at first OT Rx visit) Goal status: INITIAL  3.   With Max caregiver assistance  Pt will be able to apply multilayer, knee length, compression wraps using gradient techniques to decrease limb volume, to limit infection risk, and to limit lymphedema progression.  Baseline: Dependent Goal status: INITIAL  4.  With modified assistance ( assistive devices and extra time PRN) Pt will be able to don and doff appropriate compression garments and/or devices to control BLE lymphedema and to limit progression.  Baseline: Dependent Goal status: INITIAL  5.  Pt will achieve at least a 10% volume reductions bilaterally below the knees to return limb to more typical size and shape, to limit infection risk and LE progression, to decrease pain, to improve function, and to improve body image and QOL. Baseline: Dependent Goal status: INITIAL  6.   With Max caregiver assistance Pt will achieve and sustain no less than 85% compliance with all LE self-care home program components throughout CDT, including modified simple self-MLD, daily  skin care and inspection, lymphatic pumping the ex and appropriate compression to limit lymphedema progression and to limit further functional decline. Baseline: Dependent Goal status: INITIAL   PLAN:  PT FREQUENCY: 3x/week  PT DURATION: other: and PRN  PLANNED INTERVENTIONS: Therapeutic exercises, Therapeutic activity, Patient/Family education, Self Care, DME instructions, Manual lymph drainage, Compression bandaging, Taping, Manual therapy, and skin care with low ph castor oil and/ or lotion matching PH of skin to limit infection and mobilize skin, scar massage  PLAN FOR NEXT SESSION:  Initial BLE comparative limb volumetrics Multi layer compression wraps to LLE below the  knee Pt edu for LE self care    Andrey Spearman, Faith, OTR/L, CLT-LANA 12/10/21 2:50 PM

## 2021-12-11 ENCOUNTER — Other Ambulatory Visit: Payer: Medicaid Other | Admitting: *Deleted

## 2021-12-11 ENCOUNTER — Encounter: Payer: Self-pay | Admitting: *Deleted

## 2021-12-11 NOTE — Patient Instructions (Signed)
Visit Information  [name] was given information about Medicaid Managed Care team care coordination services as a part of their [facility] Medicaid benefit. [name] verbally consented to engagement with the [facility] Managed Care team.   If you are experiencing a medical emergency, please call 911 or report to your local emergency department or urgent care.   If you have a non-emergency medical problem during routine business hours, please contact your provider's office and ask to speak with a nurse.   For questions related to your Healthy [facility] health plan, please call: [phone] or visit the homepage here: [email or web address]  If you would like to schedule transportation through your Healthy [facility] plan, please call the following number at least 2 days in advance of your appointment: [phone]  For information about your ride after you set it up, call Ride Assist at [phone]. Use this number to activate a Will Call pickup, or if your transportation is late for a scheduled pickup. Use this number, too, if you need to make a change or cancel a previously scheduled reservation.  If you need transportation services right away, call [phone]. The after-hours call center is staffed 24 hours to handle ride assistance and urgent reservation requests (including discharges) 365 days a year. Urgent trips include sick visits, hospital discharge requests and life-sustaining treatment.  Call the [place] at [phone], at any time, 24 hours a day, 7 days a week. If you are in danger or need immediate medical attention call 911.  If you would like help to quit smoking, call 1-800-QUIT-NOW 567-827-3533) OR Espaol: 1-855-Djelo-Ya (6-144-315-4008) o para ms informacin haga clic aqu or Text READY to 200-400 to register via text  [name],   Please see education materials related to wellness care provided  as Advertising account planner.   The patient verbalized understanding of instructions, educational materials, and care plan provided today and agreed to receive a mailed copy of patient instructions, educational materials, and care plan.   Telephone follow up appointment with Managed Medicaid care management team member scheduled for:01/12/21 @ 3:30pm  Faith Joiner RN, BSN [place] RN Care Coordinator   Following is a copy of your plan of care:  Care Plan : RN Care Manager Plan of Care  Updates made by Faith Montane, RN since 12/11/2021 12:00 AM     Problem: Health Management needs related to Lymphedema      Long-Range Goal: Development of Plan of Care to address Health Management needs related to Lymphedema   Start Date: 11/16/2021  Expected End Date: 02/14/2022  Note:   Current Barriers:  Care Coordination needs related to Level of care concerns  Chronic Disease Management support and education needs related to Lymphedema  [name] had OT evaluation for Lymphedema and will start therapy 12/24/21. She did not receive the information mailed to her from [facility] or [facility].   RNCM Clinical Goal(s):  Patient will verbalize understanding of plan for management of Lymphedema as evidenced by patient reports take all medications exactly as prescribed and will call provider for medication related questions as evidenced by patient reports    attend all scheduled medical appointments: multiple therapy visits-see AVS, schedule with Dr. Haroldine Barker and PCP as evidenced by documentation in EMR        work with social worker to address Level of care concerns related to the management of Lymphedema as evidenced by review of EMR and patient or social worker report  Interventions: Inter-disciplinary care team collaboration (see longitudinal plan of care) Evaluation of current treatment plan related to  self management and patient's adherence to plan as established by provider RNCM  will resend list of requested locations with phone numbers and addresses   Lymphedema  (Status: Goal on Track (progressing): YES.) Long Term Goal  Evaluation of current treatment plan related to  Lymphedema ,  self-management and patient's adherence to plan as established by provider. Discussed plans with patient for ongoing care management follow up and provided patient with direct contact information for care management team Reviewed medications with patient and discussed having needed prescription transferred to her current pharmacy; Reviewed scheduled/upcoming provider appointments including OT two times weekly starting 12/24/21; Assessed social determinant of health barriers;  Provided patient with addresses to requested locations to assist her with utilizing [employer] transportation Reviewed OT evaluation note Discussed OT starting 12/24/21, patient will call to arrange transportation Reviewed Mammogram results Advised patient to contact Social Services to inquire about Medicare eligibility due to her disability    Patient Goals/Self-Care Activities: Take medications as prescribed   Attend all scheduled provider appointments Call provider office for new concerns or questions  Work with the social worker to address care coordination needs and will continue to work with the clinical team to address health care and disease management related needs

## 2021-12-11 NOTE — Patient Outreach (Signed)
Medicaid Managed Care   Nurse Care Manager Note  12/11/2021 Name:  [name] MRN:  [id number] DOB:  May 11, 1960  [name] is an 61 y.o. year old female who is Barker primary patient of Masters, Faith Jersey, Barker.  The [facility] Managed Care Coordination team was consulted for assistance with:    Lymphedema  [name] was given information about Medicaid Managed Care Coordination team services today. [name] Patient agreed to services and verbal consent obtained.  Engaged with patient by telephone for follow up visit in response to provider referral for case management and/or care coordination services.   Assessments/Interventions:  Review of past medical history, allergies, medications, health status, including review of consultants reports, laboratory and other test data, was performed as part of comprehensive evaluation and provision of chronic care management services.  SDOH (Social Determinants of Health) assessments and interventions performed: SDOH Interventions    Flowsheet Row Patient Outreach Telephone from 12/11/2021 in [place] Patient Outreach Telephone from 11/16/2021 in [place] Office Visit from 11/05/2021 in [place] Patient Outreach Telephone from 04/22/2021 in [place] Patient Outreach Telephone from 02/24/2021 in [place] Patient Outreach Telephone from 01/08/2021 in [place] Coordination  SDOH Interventions        Food Insecurity Interventions -- -- -- -- -- Intervention Not Indicated  Housing Interventions -- -- -- -- Intervention Not Indicated Intervention Not Indicated  Transportation Interventions Intervention Not Indicated Other (Comment)  [Provided patient with requested addresses to assist her with utilizing SCAT] -- -- Intervention Not Indicated --  Utilities  Interventions -- -- Intervention Not Indicated -- -- --  Physical Activity Interventions -- -- -- Intervention Not Indicated -- --       Care Plan  Allergies  Allergen Reactions   Aspirin Swelling    REACTION: airway swelling   Codeine Other (See Comments)    REACTION: tingling in lips and hard breathing - had reaction at dentist - states "I can't take certain kinds of codeine" - happened maybe 10 yr ago   Lisinopril Cough   Sulfonamide Derivatives Swelling    REACTION: airway swelling   Latex Rash    Medications Reviewed Today     Reviewed by Faith Montane, RN (Registered Nurse) on 12/11/21 at 1456  Med List Status: <None>   Medication Order Taking? Sig Documenting Provider Last Dose Status Informant  ACCU-CHEK AVIVA PLUS test strip [id number] No USE 1 STRIP TO CHECK GLUCOSE ONCE DAILY  Patient not taking: Reported on 08/27/2021   Faith Roan, MD Not Taking Active Self  acetaminophen (TYLENOL) 500 MG tablet [id number] Yes Take 1,000 mg by mouth every 6 (six) hours as needed. [provider] Taking Active   carboxymethylcellulose (REFRESH PLUS) 0.5 % SOLN [id number] Yes 1 drop 3 (three) times daily as needed. [provider] Taking Active   cetirizine (ZYRTEC) 10 MG tablet [id number] Yes TAKE ONE TABLET BY MOUTH DAILY Masters, [name] Taking Active   clotrimazole-betamethasone (LOTRISONE) cream [id number] Yes Apply 1 Application topically 2 (two) times daily. [name], DPM Taking Active   cycloSPORINE (RESTASIS) 0.05 % ophthalmic emulsion [id number] No Place 1 drop into both eyes 2 (two) times daily as needed (dry eyes).  Patient not taking: Reported on 11/16/2021   [provider] Not Taking Active   diphenhydrAMINE (BENADRYL) 25 MG tablet [id number] Yes Take 1 tablet (  25 mg total) by mouth every 6 (six) hours as needed. Faith Cure, Barker Taking Active   Ferrous Sulfate (IRON PO) [id number] Yes Take by mouth daily. [provider]  Taking Active   Fluconazole POWD [id number] No 1 Application by Does not apply route daily.  Patient not taking: Reported on 11/16/2021   Faith Ruffini, MD Not Taking Active            Med Note [name]   Mon Nov 16, 2021 12:46 PM) Patient needs to pick up prescription  fluticasone (FLONASE) 50 MCG/ACT nasal spray [id number]  Place 1 spray into both nostrils daily as needed for allergies. Faith Noble, MD  Expired 05/13/21 2359 Self  gabapentin (NEURONTIN) 300 MG capsule [id number] Yes TAKE TWO CAPSULES BY MOUTH THREE TIMES Barker DAY Masters, [name] Taking Active   metFORMIN (GLUCOPHAGE) 500 MG tablet [id number] Yes TAKE HALF TABLET BY MOUTH DAILY WITH Aviva [name], Barker Taking Active   OXYGEN [id number] Yes Inhale 4 L into the lungs continuous. [provider] Taking Active Self  pantoprazole (PROTONIX) 20 MG tablet [id number] Yes TAKE ONE TABLET BY MOUTH TWICE Barker DAY Masters, [name] Taking Active   potassium chloride SA (KLOR-CON M) 20 MEQ tablet [id number] Yes TAKE TWO TABLETS BY MOUTH TWICE Barker DAY Masters, [name] Taking Active   pravastatin (PRAVACHOL) 20 MG tablet [id number] Yes TAKE ONE TABLET BY MOUTH DAILY Masters, Faith Jersey, Barker Taking Active   sildenafil (REVATIO) 20 MG tablet [id number] Yes Take 1 tablet (20 mg total) by mouth 3 (three) times daily. Barker, Faith Pascal, MD Taking Active   spironolactone (ALDACTONE) 25 MG tablet [id number] Yes TAKE HALF TABLET BY MOUTH DAILY Masters, [name] Taking Active   torsemide (DEMADEX) 20 MG tablet [id number] Yes TAKE FOUR TABLETS BY MOUTH TWICE Barker DAY Masters, [name] Taking Active   treprostinil (REMODULIN) 5 MG/ML SOLN injection [id number] Yes See admin instructions. As of 08/09/2019: Add 7 ml of Remodulin to cassette and 93 ml of sterile diluent for Remodulin to cassetet for Barker total volume of 100 ml to make Barker concentration of 350,000 ng per ml.  Infuse via Barker CADD pump intravenously at Barker rate of 40 ml per 24 hours. Based  on Barker dosing weight of 120 kg, the dose is 81 ng per kg per min.  Once opened, discard Remodulin vial after 30 days.  Sterile Diluent for Remodulin vials are single use only. Once mixed, discard cassette after 48 hours. [provider] Taking Active Self           Med Note Ralene Cork Dec 17, 2019 12:50 PM) Continuous infusion 12/17/19  vitamin B-12 (CYANOCOBALAMIN) 500 MCG tablet [id number] Yes TAKE ONE TABLET BY MOUTH DAILY Masters, [name] Taking Active   XARELTO 20 MG TABS tablet [id number] Yes TAKE ONE TABLET BY MOUTH DAILY WITH SUPPER Christiana Fuchs, Barker Taking Active             Patient Active Problem List   Diagnosis Date Noted   Maceration of skin 10/10/2021   Chronic venous insufficiency 08/01/2021   Knee osteoarthritis 12/12/2020   Depression 09/02/2020   Left paraspinal back pain 05/05/2020   Hearing difficulty of both ears 05/05/2020   Internal hemorrhoid 01/11/2020   Fibroid, uterine 11/07/2019   Bleeding from the genitourinary system 09/13/2019   Urinary incontinence 12/12/2018   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[place])    Right ovarian cyst 05/19/2017  Rash 10/31/2014   Peripheral neuropathy ([place]) 09/27/2014   Healthcare maintenance 09/27/2014   Atrial flutter ([place]) 08/17/2012   Right heart failure, NYHA class 3 ([place]) 03/15/2012   Allergic rhinitis 11/16/2011   Diabetes ([place]) 10/15/2010   Hyperlipidemia 10/15/2010   Obstructive sleep apnea 02/21/2009   GERD 11/29/2005    Conditions to be addressed/monitored per PCP order:   Lymphedema  Care Plan : [place] of Care  Updates made by Faith Montane, RN since 12/11/2021 12:00 AM     Problem: Health Management needs related to Lymphedema      Long-Range Goal: Development of Plan of Care to address Health Management needs related to Lymphedema   Start Date: 11/16/2021  Expected End Date: 02/14/2022  Note:   Current Barriers:  Care Coordination  needs related to Level of care concerns  Chronic Disease Management support and education needs related to Lymphedema  Ms. Bartolotta had OT evaluation for Lymphedema and will start therapy 12/24/21. She did not receive the information mailed to her from [facility] or BSW.   RNCM Clinical Goal(s):  Patient will verbalize understanding of plan for management of Lymphedema as evidenced by patient reports take all medications exactly as prescribed and will call provider for medication related questions as evidenced by patient reports    attend all scheduled medical appointments: multiple therapy visits-see AVS, schedule with Dr. Haroldine Laws and PCP as evidenced by documentation in EMR        work with social worker to address Level of care concerns related to the management of Lymphedema as evidenced by review of EMR and patient or social worker report        Interventions: Inter-disciplinary care team collaboration (see longitudinal plan of care) Evaluation of current treatment plan related to  self management and patient's adherence to plan as established by provider RNCM will resend list of requested locations with phone numbers and addresses   Lymphedema  (Status: Goal on Track (progressing): YES.) Long Term Goal  Evaluation of current treatment plan related to  Lymphedema ,  self-management and patient's adherence to plan as established by provider. Discussed plans with patient for ongoing care management follow up and provided patient with direct contact information for care management team Reviewed medications with patient and discussed having needed prescription transferred to her current pharmacy; Reviewed scheduled/upcoming provider appointments including OT two times weekly starting 12/24/21; Assessed social determinant of health barriers;  Provided patient with addresses to requested locations to assist her with utilizing [employer] transportation Reviewed OT evaluation note Discussed OT starting  12/24/21, patient will call to arrange transportation Reviewed Mammogram results Advised patient to contact Social Services to inquire about Medicare eligibility due to her disability    Patient Goals/Self-Care Activities: Take medications as prescribed   Attend all scheduled provider appointments Call provider office for new concerns or questions  Work with the social worker to address care coordination needs and will continue to work with the clinical team to address health care and disease management related needs       Follow Up:  Patient agrees to Care Plan and Follow-up.  Plan: The Managed Medicaid care management team will reach out to the patient again over the next 30 days.  Date/time of next scheduled RN care management/care coordination outreach:  01/12/22 @ 3:30pm  [name] RN, BSN [place] RN Care Coordinator

## 2021-12-11 NOTE — Addendum Note (Signed)
Addended by: Ansel Bong on: 12/11/2021 11:59 AM   Modules accepted: Orders

## 2021-12-16 ENCOUNTER — Encounter: Payer: Self-pay | Admitting: Internal Medicine

## 2021-12-16 ENCOUNTER — Other Ambulatory Visit: Payer: Self-pay | Admitting: Internal Medicine

## 2021-12-24 ENCOUNTER — Ambulatory Visit: Payer: Medicaid Other | Admitting: Occupational Therapy

## 2021-12-24 ENCOUNTER — Encounter: Payer: Self-pay | Admitting: Occupational Therapy

## 2021-12-24 DIAGNOSIS — I872 Venous insufficiency (chronic) (peripheral): Secondary | ICD-10-CM | POA: Diagnosis not present

## 2021-12-24 DIAGNOSIS — I890 Lymphedema, not elsewhere classified: Secondary | ICD-10-CM

## 2021-12-25 ENCOUNTER — Other Ambulatory Visit: Payer: Self-pay

## 2021-12-25 ENCOUNTER — Encounter (HOSPITAL_COMMUNITY): Payer: Self-pay

## 2021-12-25 ENCOUNTER — Encounter: Payer: Self-pay | Admitting: Occupational Therapy

## 2021-12-25 ENCOUNTER — Ambulatory Visit: Payer: Medicaid Other | Admitting: Occupational Therapy

## 2021-12-25 ENCOUNTER — Emergency Department: Payer: Self-pay

## 2021-12-25 ENCOUNTER — Emergency Department (HOSPITAL_COMMUNITY)
Admission: EM | Admit: 2021-12-25 | Discharge: 2021-12-26 | Disposition: A | Discharge status: Home / Self Care | Payer: Medicaid Other | Attending: Emergency Medicine | Admitting: Emergency Medicine

## 2021-12-25 ENCOUNTER — Emergency Department (HOSPITAL_COMMUNITY): Payer: Medicaid Other

## 2021-12-25 DIAGNOSIS — E1122 Type 2 diabetes mellitus with diabetic chronic kidney disease: Secondary | ICD-10-CM | POA: Diagnosis not present

## 2021-12-25 DIAGNOSIS — F32A Depression, unspecified: Secondary | ICD-10-CM | POA: Diagnosis not present

## 2021-12-25 DIAGNOSIS — Z7901 Long term (current) use of anticoagulants: Secondary | ICD-10-CM | POA: Diagnosis not present

## 2021-12-25 DIAGNOSIS — I2721 Secondary pulmonary arterial hypertension: Secondary | ICD-10-CM | POA: Insufficient documentation

## 2021-12-25 DIAGNOSIS — I4892 Unspecified atrial flutter: Secondary | ICD-10-CM | POA: Diagnosis not present

## 2021-12-25 DIAGNOSIS — Y848 Other medical procedures as the cause of abnormal reaction of the patient, or of later complication, without mention of misadventure at the time of the procedure: Secondary | ICD-10-CM | POA: Insufficient documentation

## 2021-12-25 DIAGNOSIS — R079 Chest pain, unspecified: Secondary | ICD-10-CM | POA: Diagnosis not present

## 2021-12-25 DIAGNOSIS — I509 Heart failure, unspecified: Secondary | ICD-10-CM | POA: Diagnosis not present

## 2021-12-25 DIAGNOSIS — Z79899 Other long term (current) drug therapy: Secondary | ICD-10-CM | POA: Diagnosis not present

## 2021-12-25 DIAGNOSIS — G4733 Obstructive sleep apnea (adult) (pediatric): Secondary | ICD-10-CM | POA: Insufficient documentation

## 2021-12-25 DIAGNOSIS — I130 Hypertensive heart and chronic kidney disease with heart failure and stage 1 through stage 4 chronic kidney disease, or unspecified chronic kidney disease: Secondary | ICD-10-CM | POA: Insufficient documentation

## 2021-12-25 DIAGNOSIS — Z91040 Latex allergy status: Secondary | ICD-10-CM | POA: Insufficient documentation

## 2021-12-25 DIAGNOSIS — N1831 Chronic kidney disease, stage 3a: Secondary | ICD-10-CM | POA: Diagnosis not present

## 2021-12-25 DIAGNOSIS — T82524A Displacement of infusion catheter, initial encounter: Secondary | ICD-10-CM | POA: Diagnosis present

## 2021-12-25 DIAGNOSIS — E785 Hyperlipidemia, unspecified: Secondary | ICD-10-CM | POA: Insufficient documentation

## 2021-12-25 DIAGNOSIS — I872 Venous insufficiency (chronic) (peripheral): Secondary | ICD-10-CM | POA: Diagnosis not present

## 2021-12-25 DIAGNOSIS — I890 Lymphedema, not elsewhere classified: Secondary | ICD-10-CM | POA: Diagnosis not present

## 2021-12-25 DIAGNOSIS — Z452 Encounter for adjustment and management of vascular access device: Secondary | ICD-10-CM

## 2021-12-25 MED ORDER — TREPROSTINIL 100 MG/20ML IJ SOLN
81.0000 ng/kg/min | INTRAVENOUS | Status: DC
  Administered 2021-12-25: 81 ng/kg/min via INTRAVENOUS
  Filled 2021-12-25 (×2): qty 7

## 2021-12-25 NOTE — Therapy (Addendum)
OUTPATIENT OCCUPATIONAL THERAPY TREATMENT NOTE BILATERAL LOWER EXTREMITY LYMPHEDEMA   Patient Name: [name] MRN: [id number] DOB:Jan 27, 1960, 61 y.o., female Today's Date: 12/25/2021  END OF SESSION:  OT End of Session - 12/24/21 1543     Visit Number 2    Number of Visits 36    Date for OT Re-Evaluation 03/10/22    OT Start Time 0300    OT Stop Time 0415    OT Time Calculation (min) 75 min    Activity Tolerance Patient tolerated treatment well;No increased pain    Behavior During Therapy [facility] for tasks assessed/performed               Past Medical History:  Diagnosis Date   Acute on chronic diastolic (congestive) heart failure ([place]) 06/25/2013   COPD (chronic obstructive pulmonary disease) (HCC)    Cor pulmonale (HCC)    PA Peak pressure 78mHg   Depression    Diabetes mellitus    well controlled on metformin   External hemorrhoid 01/11/2020   GERD (gastroesophageal reflux disease)    H/O mental retardation    Hyperlipidemia    Hypertension    OSA (obstructive sleep apnea)    CPAP   Pulmonary hypertension (HCC)    Restrictive lung disease    PFTs 06/2012 (FVC 54% predicted and FEV1 68% predicted w minimal bronchodilator response).   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 ulcer ([place]    [place], ?followed up at wound care center, multiple courses of antibiotics in past for cellulitis, on lasix   Past Surgical History:  Procedure Laterality Date   CARDIAC CATHETERIZATION N/A 01/13/2016   Procedure: Right Heart Cath;  Surgeon: DJolaine Artist MD;  [facility];  Service: Cardiovascular;  Laterality: N/A;   CHOLECYSTECTOMY     COLONOSCOPY WITH PROPOFOL N/A 12/20/2019   Procedure: COLONOSCOPY WITH PROPOFOL;  Surgeon: JMilus Banister MD;  [facility];  Service: Endoscopy;  Laterality: N/A;   IR FLUORO GUIDE CV LINE LEFT  11/16/2019   IR  FLUORO GUIDE CV LINE LEFT  06/17/2020   IR FLUORO GUIDE CV LINE LEFT  07/04/2020   IR FLUORO GUIDE CV LINE LEFT  07/03/2021   IR FLUORO GUIDE CV LINE RIGHT  05/09/2019   IR FLUORO GUIDE CV LINE RIGHT  08/12/2019   IR REMOVAL TUN CV CATH W/O FL  05/05/2019   IR [place] ACCESS LEFT  11/16/2019   IR [place] VASC ACCESS LEFT  06/17/2020   IR [place] VASC ACCESS LEFT  07/04/2020   IR [place] VASC ACCESS LEFT  07/03/2021   IR [place] VASC ACCESS RIGHT  05/09/2019   IR [place] VASC ACCESS RIGHT  08/12/2019   IR [place] VASC ACCESS RIGHT  11/16/2019   IR VENO/JUGULAR RIGHT  11/16/2019   LEFT AND RIGHT HEART CATHETERIZATION WITH CORONARY ANGIOGRAM N/A 02/28/2014   Procedure: LEFT AND RIGHT HEART CATHETERIZATION WITH CORONARY ANGIOGRAM;  Surgeon: DJolaine Artist MD;  [facility];  Service: Cardiovascular;  Laterality: N/A;   RIGHT HEART CATH N/A 06/27/2018   Procedure: RIGHT HEART CATH;  Surgeon: BJolaine Artist MD;  [facility];  Service: Cardiovascular;  Laterality: N/A;   RIGHT/LEFT HEART CATH AND CORONARY ANGIOGRAPHY N/A 05/14/2019   Procedure: RIGHT/LEFT HEART CATH AND CORONARY ANGIOGRAPHY;  Surgeon: BJolaine Artist MD;  Location: [place] [facility] LAB;  Service: Cardiovascular;  Laterality: N/A;   Patient Active Problem List   Diagnosis Date Noted   Maceration of skin 10/10/2021   Chronic venous insufficiency 08/01/2021   Knee osteoarthritis 12/12/2020   Depression 09/02/2020   Left paraspinal back pain 05/05/2020   Hearing difficulty of both ears 05/05/2020   Internal hemorrhoid 01/11/2020   Fibroid, uterine 11/07/2019   Bleeding from the genitourinary system 09/13/2019   Urinary incontinence 12/12/2018   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[place])    Right ovarian cyst 05/19/2017   Rash 10/31/2014   Peripheral neuropathy ([place]) 09/27/2014   Healthcare maintenance 09/27/2014   Atrial flutter ([place]) 08/17/2012   Right heart  failure, NYHA class 3 ([place]) 03/15/2012   Allergic rhinitis 11/16/2011   Diabetes ([place]) 10/15/2010   Hyperlipidemia 10/15/2010   Obstructive sleep apnea 02/21/2009   GERD 11/29/2005    PCP: Christiana Fuchs, DO  REFERRING PROVIDER: [name], MD  REFERRING DIAG: I89.0   THERAPY DIAG:  Lymphedema, not elsewhere classified  Rationale for Evaluation and Treatment: Rehabilitation  ONSET DATE: 12/08/21  SUBJECTIVE:                                                                                                                                                                                           SUBJECTIVE STATEMENT: [name] presents to OT for initial Intensive Phase CDT treatment to LLE  to address BLE phlebo-lymphedema.  Pt has no new complaints since initial evaluation. She denies LE-related leg pain.   PERTINENT HISTORY: DM, HTN, CHF, OSA, Mental illness, COPD, OA knees, Hx stasis ulcer, Atrial Flutter; obesity, stage III CKD, Pulmonary HTN  FUNCTIONAL LIMITATIONS: Gait abnormality,  impaired functional ambulation, transfers and mobility, HOH, decreased standing and walking tolerance, impaired balance, urinary incontinence, OA knee pain and decreased AROM B knees and ankles, chronic leg swelling with associated pain, difficulty fitting street shoes and LB clothing, difficulty reaching feet to bathe, inspect skin, groom nails and perform skin care, difficulty managing transportation and operating a cell phone, difficulty shopping, with meal prep and housekeeping  PAIN:  Are you having pain? No  PRECAUTIONS: Other: LYMPHEDEMA PRECAUTIONS ; Hx venous ulcer ; DM, [place]  PRIOR LEVEL OF FUNCTION: Independent with basic ADLs and Independent with household mobility without device  PATIENT GOALS: reduce leg swelling and limit skin breakdown   OBJECTIVE:  OBSERVATIONS /ASSESSMENTS:   LYMPHEDEMA LIFE IMPACT SCALE (LLIS):  12/24/21 initial 49.55%    FOTO functional  outcomes score: TBA   BLE COMPARATIVE LIMB VOLUMETRICS:   LANDMARK RIGHT  12/24/21 INITIAL  R LEG (A-D) (  Dominant) 6449.8 ml  R THIGH (E-G) 7051.0 ml  R FULL LIMB (A-G) 13500.9 ml  Limb Volume differential (LVD)  %  Volume change since initial %  Volume change overall V  (Blank rows = not tested)  LANDMARK LEFT  12/24/21 INITIAL  R LEG (A-D) 6673.4 ml  R THIGH (E-G) 6996.2 ml  R FULL LIMB (A-G) 13669.6 ml  Limb Volume differential (LVD)  Leg LVD= 3%, L>R, thigh LVD= 7%, R>L,  Full limb LVD= 1.23%, L>R%  Volume change since initial %  Volume change overall %  (Blank rows = not tested)   TODAY'S TREATMENT:                                                                                                          Initial BLE comparative limb volumetrics Multilayer compression wraps to LLE below the knee  PATIENT EDUCATION:  Intro level simple self MLD, cont bandaging edu Person educated: Patient Education method: Explanation, Demonstration, Tactile cues, Verbal cues, and Handouts Education comprehension: verbalized understanding, returned demonstration, verbal cues required, and needs further education  LYMPHEDEMA SELF-CARE HOME PROGRAM:(Intensive and self-management phases of CDT) 1.Simple Self-MLD to BLE/BLQ daily 2. Compression: short stretch multi layer wraps on one leg at a time only during Intensive Phase CDT; Fit with appropriate compression garments during self-management phase. 3. Daily skin care 4. Lymphatic pumping there ex- 1 x daily, 10 reps of each, in order 5. Flexitouch advanced sequential pneumatic compression device, or "pump"  ASSESSMENT:  CLINICAL IMPRESSION: Initial BLE comparative limb volumetrics reveal limb volumes below the knee are similar. Both limbs are equally swollen. Thigh volume differential is larger on the dominant R limb, which is expected. Overall limb volume differential is WNL at 1.23%, L>R. These values do not capture level of bilateral  swelling, tissue density and degraded skin condition. Applied knee length LLE multilayer compression wraps using gradient techniques. Excellent tolerance for all aspects of care today. Cont as per POC.    OBJECTIVE IMPAIRMENTS: Abnormal gait, decreased balance, decreased cognition, decreased knowledge of condition, decreased knowledge of use of DME, decreased mobility, decreased ROM, increased edema, impaired flexibility, impaired sensation, obesity, and pain.   ACTIVITY LIMITATIONS: sitting, standing, squatting, stairs, transfers, bed mobility, continence, bathing, toileting, and dressing  PARTICIPATION LIMITATIONS: meal prep, cleaning, laundry, driving, shopping, community activity, yard work, and Social participation limited due to increased lag pain and swelling with standing , walking, and or dependent sitting > 15 min.  PERSONAL FACTORS: Age, Behavior pattern, Time since onset of injury/illness/exacerbation, Transportation, 3+ comorbidities:  , and hx of poor attendenace and non compliance with previous  CDT  are also affecting patient's functional outcome.   REHAB POTENTIAL: Good  EVALUATION COMPLEXITY: Moderate   GOALS: Goals reviewed with patient? Yes  SHORT TERM GOALS: Target date: 4th OT Rx visit   Pt will demonstrate understanding of lymphedema precautions and prevention strategies with modified independence using a printed reference to identify at least 5 precautions and discussing how s/he may implement them into daily life to reduce risk of progression  with min A. Baseline: Max A Goal status: INITIAL  2.   With Max caregiver assistance  x 1 Pt will be able to apply multilayer, knee length, compression wraps using gradient techniques to decrease limb volume, to limit infection risk, and to limit lymphedema progression.  Baseline: Dependent Goal status: INITIAL  LONG TERM GOALS: Target date: 03/08/22  Given this patient's Intake score of 44.12% on the Lymphedema Life Impact  Scale (LLIS), patient will experience a reduction of at least 5% in her perceived level of functional impairment resulting from lymphedema to improve functional performance and quality of life (QOL). Baseline: 44.12% Goal status: INITIAL  2.  Given this patient's Intake score of TBA/100% on the functional outcomes FOTO tool, patient will experience an increase in function of 5 points to improve basic and instrumental ADLs performance, including lymphedema self-care. Baseline:  (TBA at first OT Rx visit) Goal status: INITIAL  3.   With Max caregiver assistance  Pt will be able to apply multilayer, knee length, compression wraps using gradient techniques to decrease limb volume, to limit infection risk, and to limit lymphedema progression.  Baseline: Dependent Goal status: INITIAL  4.  With modified assistance ( assistive devices and extra time PRN) Pt will be able to don and doff appropriate compression garments and/or devices to control BLE lymphedema and to limit progression.  Baseline: Dependent Goal status: INITIAL  5.  Pt will achieve at least a 10% volume reductions bilaterally below the knees to return limb to more typical size and shape, to limit infection risk and LE progression, to decrease pain, to improve function, and to improve body image and QOL. Baseline: Dependent Goal status: INITIAL  6.   With Max caregiver assistance Pt will achieve and sustain no less than 85% compliance with all LE self-care home program components throughout CDT, including modified simple self-MLD, daily skin care and inspection, lymphatic pumping the ex and appropriate compression to limit lymphedema progression and to limit further functional decline. Baseline: Dependent Goal status: INITIAL   PLAN:  PT FREQUENCY: 3x/week  PT DURATION: other: and PRN  PLANNED INTERVENTIONS: Therapeutic exercises, Therapeutic activity, Patient/Family education, Self Care, DME instructions, Manual lymph drainage,  Compression bandaging, Taping, Manual therapy, and skin care with low ph castor oil and/ or lotion matching PH of skin to limit infection and mobilize skin, scar massage  PLAN FOR NEXT SESSION:  Initial BLE comparative limb volumetrics Multi layer compression wraps to LLE below the knee Pt edu for LE self care    Andrey Spearman, MS, OTR/L, CLT-LANA 12/25/21 10:13 AM

## 2021-12-25 NOTE — Progress Notes (Signed)
PICC order received. Per pt record, this pt has lines placed by IR. Secure chat with RN and Dr. Sherry Ruffing made aware. MD to place IR consult. Informed that a PIV/CL may be placed for immediate access.

## 2021-12-25 NOTE — ED Provider Triage Note (Signed)
Emergency Medicine Provider Triage Evaluation Note  [name] , a 61 y.o. female  was evaluated in triage.  Pt complains of PICC line falling out. Patient reports that she was receiving her pulmonary hypertension medications. PICC line but it fell out earlier today about 2 hours ago.  Patient states that she is currently asymptomatic not in respiratory distress, no chest pain, no fevers, abdominal pain.  Review of Systems  Positive: As above Negative: As above  Physical Exam  BP 132/66 (BP Location: Right Arm)   Pulse 73   Temp 98.1 F (36.7 C) (Oral)   Resp (!) 28   LMP 02/19/2011   SpO2 93%  Gen:   Awake, no distress  Resp:  Normal effort MSK:   Moves extremities without difficulty Other:    Medical Decision Making  Medically screening exam initiated at 2:55 PM.  Appropriate orders placed.  [name] was informed that the remainder of the evaluation will be completed by another provider, this initial triage assessment does not replace that evaluation, and the importance of remaining in the ED until their evaluation is complete.     Luvenia Heller, PA-C 12/25/21 1459

## 2021-12-25 NOTE — ED Notes (Addendum)
Pt came up asking if it were ok if they went to the cafeteria to get some food. I advised pt that it would not be wise and it would be a safety concern since we cannot monitor her if she leaves the waiting area. Pt understood concerns and decided to go to the cafeteria despite the risks.

## 2021-12-25 NOTE — ED Triage Notes (Addendum)
Pt states she had her cartridge changed in her infusion today, went home and saw picc line out. Pt has dressing with biopatch still intact. No active bleeding noted. Pt getting infusion for pulmonary hypertension.

## 2021-12-25 NOTE — ED Provider Notes (Signed)
[facility] EMERGENCY DEPARTMENT Provider Note   CSN: [id number] Arrival date & time: 12/25/21  1339    History  Chief Complaint  Patient presents with  . Vascular Access Problem    [name] is a 61 y.o. female.  She has a past medical 3 of OSA, hyperlipidemia, diabetes, atrial flutter on Xarelto, pulmonary arterial hypertension, stage III CKD, hypertension, depression, cor pulmonale.  HPI     Home Medications Prior to Admission medications   Medication Sig Start Date End Date Taking? Authorizing Provider  ACCU-CHEK AVIVA PLUS test strip USE 1 STRIP TO CHECK GLUCOSE ONCE DAILY Patient not taking: Reported on 08/27/2021 05/25/18   [name], MD  acetaminophen (TYLENOL) 500 MG tablet Take 1,000 mg by mouth every 6 (six) hours as needed.    [provider]  carboxymethylcellulose (REFRESH PLUS) 0.5 % SOLN 1 drop 3 (three) times daily as needed.    [provider]  cetirizine (ZYRTEC) 10 MG tablet TAKE ONE TABLET BY MOUTH DAILY 06/09/21   Masters, [name], DO  clotrimazole-betamethasone (LOTRISONE) cream Apply 1 Application topically 2 (two) times daily. 09/21/21   Edrick Kins, DPM  cycloSPORINE (RESTASIS) 0.05 % ophthalmic emulsion Place 1 drop into both eyes 2 (two) times daily as needed (dry eyes). Patient not taking: Reported on 11/16/2021    [provider]  diphenhydrAMINE (BENADRYL) 25 MG tablet Take 1 tablet (25 mg total) by mouth every 6 (six) hours as needed. 1/32/44   Campbell Stall P, DO  Ferrous Sulfate (IRON PO) Take by mouth daily.    [provider]  Fluconazole POWD 1 Application by Does not apply route daily. Patient not taking: Reported on 11/16/2021 10/08/21   Delene Ruffini, MD  fluticasone [facility]) 50 MCG/ACT nasal spray Place 1 spray into both nostrils daily as needed for allergies. 05/13/20 05/13/21  [name], MD  gabapentin (NEURONTIN) 300 MG capsule TAKE TWO CAPSULES BY MOUTH THREE  TIMES A DAY 09/22/21   Masters, [name], DO  metFORMIN (GLUCOPHAGE) 500 MG tablet TAKE HALF TABLET BY MOUTH DAILY WITH BREAKFAST 12/17/21   Masters, Katie, DO  OXYGEN Inhale 4 L into the lungs continuous.    [provider]  pantoprazole (PROTONIX) 20 MG tablet TAKE ONE TABLET BY MOUTH TWICE A DAY 11/16/21   Masters, Katie, DO  potassium chloride SA (KLOR-CON M) 20 MEQ tablet TAKE TWO TABLETS BY MOUTH TWICE A DAY 09/14/21   Masters, Katie, DO  pravastatin (PRAVACHOL) 20 MG tablet TAKE ONE TABLET BY MOUTH DAILY 09/22/21   Masters, [name], DO  sildenafil (REVATIO) 20 MG tablet Take 1 tablet (20 mg total) by mouth 3 (three) times daily. 09/21/21   Bensimhon, [name], MD  spironolactone (ALDACTONE) 25 MG tablet TAKE HALF TABLET BY MOUTH DAILY 06/09/21   Masters, [name], DO  torsemide (DEMADEX) 20 MG tablet TAKE FOUR TABLETS BY MOUTH TWICE A DAY 07/09/21   Masters, Katie, DO  treprostinil (REMODULIN) 5 MG/ML SOLN injection See admin instructions. As of 08/09/2019: Add 7 ml of Remodulin to cassette and 93 ml of sterile diluent for Remodulin to cassetet for a total volume of 100 ml to make a concentration of 350,000 ng per ml.  Infuse via a CADD pump intravenously at a rate of 40 ml per 24 hours. Based on a dosing weight of 120 kg, the dose is 81 ng per kg per min.  Once opened, discard Remodulin vial after 30 days.  Sterile Diluent for Remodulin vials  are single use only. Once mixed, discard cassette after 48 hours.    [provider]  vitamin B-12 (CYANOCOBALAMIN) 500 MCG tablet TAKE ONE TABLET BY MOUTH DAILY 10/19/21   Masters, Katie, DO  XARELTO 20 MG TABS tablet TAKE ONE TABLET BY MOUTH DAILY WITH SUPPER 11/16/21   Masters, [name], DO      Allergies    Aspirin, Codeine, Lisinopril, Sulfonamide derivatives, and Latex    Review of Systems   Review of Systems  Physical Exam Updated Vital Signs BP 132/66 (BP Location: Right Arm)   Pulse 73   Temp 98.1 F (36.7 C) (Oral)   Resp (!) 28   Ht 5'  3" (1.6 m)   Wt 117.9 kg   LMP 02/19/2011   SpO2 93%   BMI 46.06 kg/m  Physical Exam  ED Results / Procedures / Treatments   Labs (all labs ordered are listed, but only abnormal results are displayed) Labs Reviewed - No data to display  EKG None  Radiology No results found.  Procedures Procedures  {Document cardiac monitor, telemetry assessment procedure when appropriate:1}  Medications Ordered in ED Medications - No data to display  ED Course/ Medical Decision Making/ A&P                           Medical Decision Making Amount and/or Complexity of Data Reviewed Radiology: ordered.   This is a 61 y.o. female who presents to the emergency department with ***. History is obtained from ***. Medical history reviewed in EMR.  Past medical/surgical history that increases complexity of ED encounter:  ***  Initial Assessment:  With the patient's presentation of ***, most likely diagnosis is ***. Other diagnoses were considered including (but not limited to) ***. These are considered less likely due to history of present illness and physical exam findings.    Initial Plan: ***  Interpretation of Diagnostics: I personally reviewed the *** and my interpretation is as follows:  ***  ***   MDM generated using voice dictation software and may contain dictation errors. Please contact me for any clarification or with any questions.    {Document critical care time when appropriate:1} {Document review of labs and clinical decision tools ie heart score, Chads2Vasc2 etc:1}  {Document your independent review of radiology images, and any outside records:1} {Document your discussion with family members, caretakers, and with consultants:1} {Document social determinants of health affecting pt's care:1} {Document your decision making why or why not admission, treatments were needed:1} Final Clinical Impression(s) / ED Diagnoses Final diagnoses:  None    Rx / DC Orders ED  Discharge Orders     None

## 2021-12-25 NOTE — Therapy (Signed)
OUTPATIENT OCCUPATIONAL THERAPY TREATMENT NOTE BILATERAL LOWER EXTREMITY LYMPHEDEMA   Patient Name: [name] MRN: [id number] DOB:07-29-1960, 61 y.o., female Today's Date: 12/25/2021  END OF SESSION:  OT End of Session - 12/25/21 1019     Visit Number 3    Number of Visits 36    Date for OT Re-Evaluation 03/10/22    OT Start Time 0904    OT Stop Time 1000    OT Time Calculation (min) 56 min    Activity Tolerance Patient tolerated treatment well;No increased pain    Behavior During Therapy [facility] for tasks assessed/performed               Past Medical History:  Diagnosis Date   Acute on chronic diastolic (congestive) heart failure ([place]) 06/25/2013   COPD (chronic obstructive pulmonary disease) (HCC)    Cor pulmonale (HCC)    PA Peak pressure 51mHg   Depression    Diabetes mellitus    well controlled on metformin   External hemorrhoid 01/11/2020   GERD (gastroesophageal reflux disease)    H/O mental retardation    Hyperlipidemia    Hypertension    OSA (obstructive sleep apnea)    CPAP   Pulmonary hypertension (HCC)    Restrictive lung disease    PFTs 06/2012 (FVC 54% predicted and FEV1 68% predicted w minimal bronchodilator response).   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    [place], ?followed up at wound care center, multiple courses of antibiotics in past for cellulitis, on lasix   Past Surgical History:  Procedure Laterality Date   CARDIAC CATHETERIZATION N/A 01/13/2016   Procedure: Right Heart Cath;  Surgeon: Faith Artist MD;  [facility];  Service: Cardiovascular;  Laterality: N/A;   CHOLECYSTECTOMY     COLONOSCOPY WITH PROPOFOL N/A 12/20/2019   Procedure: COLONOSCOPY WITH PROPOFOL;  Surgeon: JMilus Banister MD;  Location: [facility] ENDOSCOPY;  Service: Endoscopy;  Laterality: N/A;   IR FLUORO GUIDE CV LINE LEFT  11/16/2019   IR  FLUORO GUIDE CV LINE LEFT  06/17/2020   IR FLUORO GUIDE CV LINE LEFT  07/04/2020   IR FLUORO GUIDE CV LINE LEFT  07/03/2021   IR FLUORO GUIDE CV LINE RIGHT  05/09/2019   IR FLUORO GUIDE CV LINE RIGHT  08/12/2019   IR REMOVAL TUN CV CATH W/O FL  05/05/2019   IR [place]ASC ACCESS LEFT  11/16/2019   IR [place] VASC ACCESS LEFT  06/17/2020   IR [place] VASC ACCESS LEFT  07/04/2020   IR [place] VASC ACCESS LEFT  07/03/2021   IR [place] VASC ACCESS RIGHT  05/09/2019   IR [place] VASC ACCESS RIGHT  08/12/2019   IR [place] VASC ACCESS RIGHT  11/16/2019   IR VENO/JUGULAR RIGHT  11/16/2019   LEFT AND RIGHT HEART CATHETERIZATION WITH CORONARY ANGIOGRAM N/A 02/28/2014   Procedure: LEFT AND RIGHT HEART CATHETERIZATION WITH CORONARY ANGIOGRAM;  Surgeon: Faith Artist MD;  [facility];  Service: Cardiovascular;  Laterality: N/A;   RIGHT HEART CATH N/A 06/27/2018   Procedure: RIGHT HEART CATH;  Surgeon: Faith Artist MD;  [facility];  Service: Cardiovascular;  Laterality: N/A;   RIGHT/LEFT HEART CATH AND CORONARY ANGIOGRAPHY N/A 05/14/2019   Procedure: RIGHT/LEFT HEART CATH AND CORONARY ANGIOGRAPHY;  Surgeon: Faith Artist MD;  Location: [place] [facility];  Service: Cardiovascular;  Laterality: N/A;   Patient Active Problem List   Diagnosis Date Noted   Maceration of skin 10/10/2021   Chronic venous insufficiency 08/01/2021   Knee osteoarthritis 12/12/2020   Depression 09/02/2020   Left paraspinal back pain 05/05/2020   Hearing difficulty of both ears 05/05/2020   Internal hemorrhoid 01/11/2020   Fibroid, uterine 11/07/2019   Bleeding from the genitourinary system 09/13/2019   Urinary incontinence 12/12/2018   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[place])    Right ovarian cyst 05/19/2017   Rash 10/31/2014   Peripheral neuropathy ([place]) 09/27/2014   Healthcare maintenance 09/27/2014   Atrial flutter ([place]) 08/17/2012   Right heart  failure, NYHA class 3 ([place]) 03/15/2012   Allergic rhinitis 11/16/2011   Diabetes ([place]) 10/15/2010   Hyperlipidemia 10/15/2010   Obstructive sleep apnea 02/21/2009   GERD 11/29/2005    PCP: Faith Fuchs, DO  REFERRING PROVIDER: [name], MD  REFERRING DIAG: I89.0   THERAPY DIAG:  Lymphedema, not elsewhere classified  Rationale for Evaluation and Treatment: Rehabilitation  ONSET DATE: 12/08/21  SUBJECTIVE:                                                                                                                                                                                           SUBJECTIVE STATEMENT: [name] presents to OT for initial Intensive Phase CDT treatment to LLE  to address BLE phlebo-lymphedema.  Pt has no new complaints since initial evaluation. She denies LE-related leg pain. Pt was able to tolerate knee length compression wraps to LLE overnight without increased pain or other difficulty.    PERTINENT HISTORY: DM, HTN, CHF, OSA, Mental illness, COPD, OA knees, Hx stasis ulcer, Atrial Flutter; obesity, stage III CKD, Pulmonary HTN  FUNCTIONAL LIMITATIONS: Gait abnormality,  impaired functional ambulation, transfers and mobility, HOH, decreased standing and walking tolerance, impaired balance, urinary incontinence, OA knee pain and decreased AROM B knees and ankles, chronic leg swelling with associated pain, difficulty fitting street shoes and LB clothing, difficulty reaching feet to bathe, inspect skin, groom nails and perform skin care, difficulty managing transportation and operating a cell phone, difficulty shopping, with meal prep and housekeeping  PAIN:  Are you having pain? No  PRECAUTIONS: Other: LYMPHEDEMA PRECAUTIONS ; Hx venous ulcer ; DM, [place]  PRIOR LEVEL OF FUNCTION: Independent with basic ADLs and Independent with household mobility without device  PATIENT GOALS: reduce leg swelling and limit skin  breakdown   OBJECTIVE:  OBSERVATIONS /ASSESSMENTS:   LYMPHEDEMA LIFE IMPACT SCALE (LLIS):  12/24/21 initial 49.55%    FOTO functional outcomes  score:  TBA   BLE COMPARATIVE LIMB VOLUMETRICS:   LANDMARK RIGHT  12/24/21 INITIAL  R LEG (A-D) (Dominant) 6449.8 ml  R THIGH (E-G) 7051.0 ml  R FULL LIMB (A-G) 13500.9 ml  Limb Volume differential (LVD)  %  Volume change since initial %  Volume change overall V  (Blank rows = not tested)  LANDMARK LEFT  12/24/21 INITIAL  R LEG (A-D) 6673.4 ml  R THIGH (E-G) 6996.2 ml  R FULL LIMB (A-G) 13669.6 ml  Limb Volume differential (LVD)  Leg LVD= 3%, L>R, thigh LVD= 7%, R>L,  Full limb LVD= 1.23%, L>R%  Volume change since initial %  Volume change overall %  (Blank rows = not tested)   TODAY'S TREATMENT:                                                                                                          Commenced LLE/LLQ MLD Multilayer compression wraps to LLE below the knee  PATIENT EDUCATION:  Intro level simple self MLD, cont bandaging edu Person educated: Patient Education method: Explanation, Demonstration, Tactile cues, Verbal cues, and Handouts Education comprehension: verbalized understanding, returned demonstration, verbal cues required, and needs further education  LYMPHEDEMA SELF-CARE HOME PROGRAM:(Intensive and self-management phases of CDT) 1.Simple Self-MLD to BLE/BLQ daily 2. Compression: short stretch multi layer wraps on one leg at a time only during Intensive Phase CDT; Fit with appropriate compression garments during self-management phase. 3. Daily skin care 4. Lymphatic pumping there ex- 1 x daily, 10 reps of each, in order 5. Flexitouch advanced sequential pneumatic compression device, or "pump"  ASSESSMENT:  CLINICAL IMPRESSION: Compression wraps applied in clinic yesterday removed this morning to reveal a very good response to compression therapy. Swelling below the knee is dramatically reduced. Commenced  MLD to LLE/LLQ utilizing short neck sequence, deep abdominal LN, functional inguinal watershed, and proximal to distal J strokes to decongest thigh first, then bottleneck region, then leg and lastly, ankle and foot. Reapplied compression wraps as established with single layer Rosidal foam over cotton stockinett, then Artiflex to     bump out the ankle to create a columnar shaped leg (vs an inverted come). Applied  one each 8, 10 and 12 cm wide short stretch wraps using gradient techniques, and covered tap with stretch net. Pt agrees to remove wraps in 24 hours. Cont as per OC.    OBJECTIVE IMPAIRMENTS: Abnormal gait, decreased balance, decreased cognition, decreased knowledge of condition, decreased knowledge of use of DME, decreased mobility, decreased ROM, increased edema, impaired flexibility, impaired sensation, obesity, and pain.   ACTIVITY LIMITATIONS: sitting, standing, squatting, stairs, transfers, bed mobility, continence, bathing, toileting, and dressing  PARTICIPATION LIMITATIONS: meal prep, cleaning, laundry, driving, shopping, community activity, yard work, and Social participation limited due to increased lag pain and swelling with standing , walking, and or dependent sitting > 15 min.  PERSONAL FACTORS: Age, Behavior pattern, Time since onset of injury/illness/exacerbation, Transportation, 3+ comorbidities:  , and hx of poor attendenace and non compliance with previous  CDT  are also affecting patient's functional  outcome.   REHAB POTENTIAL: Good  EVALUATION COMPLEXITY: Moderate   GOALS: Goals reviewed with patient? Yes  SHORT TERM GOALS: Target date: 4th OT Rx visit   Pt will demonstrate understanding of lymphedema precautions and prevention strategies with modified independence using a printed reference to identify at least 5 precautions and discussing how s/he may implement them into daily life to reduce risk of progression with min A. Baseline: Max A Goal status:  INITIAL  2.   With Max caregiver assistance  x 1 Pt will be able to apply multilayer, knee length, compression wraps using gradient techniques to decrease limb volume, to limit infection risk, and to limit lymphedema progression.  Baseline: Dependent Goal status: INITIAL  LONG TERM GOALS: Target date: 03/08/22  Given this patient's Intake score of 44.12% on the Lymphedema Life Impact Scale (LLIS), patient will experience a reduction of at least 5% in her perceived level of functional impairment resulting from lymphedema to improve functional performance and quality of life (QOL). Baseline: 44.12% Goal status: INITIAL  2.  Given this patient's Intake score of TBA/100% on the functional outcomes FOTO tool, patient will experience an increase in function of 5 points to improve basic and instrumental ADLs performance, including lymphedema self-care. Baseline:  (TBA at first OT Rx visit) Goal status: INITIAL  3.   With Max caregiver assistance  Pt will be able to apply multilayer, knee length, compression wraps using gradient techniques to decrease limb volume, to limit infection risk, and to limit lymphedema progression.  Baseline: Dependent Goal status: INITIAL  4.  With modified assistance ( assistive devices and extra time PRN) Pt will be able to don and doff appropriate compression garments and/or devices to control BLE lymphedema and to limit progression.  Baseline: Dependent Goal status: INITIAL  5.  Pt will achieve at least a 10% volume reductions bilaterally below the knees to return limb to more typical size and shape, to limit infection risk and LE progression, to decrease pain, to improve function, and to improve body image and QOL. Baseline: Dependent Goal status: INITIAL  6.   With Max caregiver assistance Pt will achieve and sustain no less than 85% compliance with all LE self-care home program components throughout CDT, including modified simple self-MLD, daily skin care and  inspection, lymphatic pumping the ex and appropriate compression to limit lymphedema progression and to limit further functional decline. Baseline: Dependent Goal status: INITIAL   PLAN:  PT FREQUENCY: 3x/week  PT DURATION: other: and PRN  PLANNED INTERVENTIONS: Therapeutic exercises, Therapeutic activity, Patient/Family education, Self Care, DME instructions, Manual lymph drainage, Compression bandaging, Taping, Manual therapy, and skin care with low ph castor oil and/ or lotion matching PH of skin to limit infection and mobilize skin, scar massage  PLAN FOR NEXT SESSION:  Initial BLE comparative limb volumetrics Multi layer compression wraps to LLE below the knee Pt edu for LE self care   Andrey Spearman, MS, OTR/L, CLT-LANA 12/25/21 11:46 AM

## 2021-12-26 ENCOUNTER — Emergency Department (HOSPITAL_COMMUNITY): Payer: Medicaid Other

## 2021-12-26 DIAGNOSIS — Z452 Encounter for adjustment and management of vascular access device: Secondary | ICD-10-CM | POA: Diagnosis not present

## 2021-12-26 HISTORY — PX: IR FLUORO GUIDE CV LINE LEFT: IMG2282

## 2021-12-26 HISTORY — PX: IR US GUIDE VASC ACCESS LEFT: IMG2389

## 2021-12-26 MED ORDER — FUROSEMIDE 10 MG/ML IJ SOLN
40.0000 mg | Freq: Once | INTRAMUSCULAR | Status: AC
  Administered 2021-12-26: 40 mg via INTRAVENOUS
  Filled 2021-12-26: qty 4

## 2021-12-26 MED ORDER — LIDOCAINE-EPINEPHRINE 1 %-1:100000 IJ SOLN
INTRAMUSCULAR | Status: AC
  Administered 2021-12-26: 10 mL
  Filled 2021-12-26: qty 1

## 2021-12-26 NOTE — ED Provider Notes (Signed)
Patient spent the night in the ED awaiting IR PICC line replacement. Physical Exam  BP (!) 119/56   Pulse 87   Temp 97.9 F (36.6 C) (Oral)   Resp 16   Ht _0  (1.6 m)   Wt 117.9 kg   LMP 02/19/2011   SpO2 93%   BMI 46.06 kg/m   Physical Exam Constitutional:      General: She is not in acute distress.    Appearance: Normal appearance. She is obese. She is ill-appearing (Chronically). She is not toxic-appearing.  HENT:     Head: Normocephalic and atraumatic.     Right Ear: External ear normal.     Left Ear: External ear normal.     Nose: Nose normal.     Mouth/Throat:     Mouth: Mucous membranes are moist.  Eyes:     Extraocular Movements: Extraocular movements intact.  Cardiovascular:     Rate and Rhythm: Normal rate and regular rhythm.  Pulmonary:     Effort: Pulmonary effort is normal.  Abdominal:     Palpations: Abdomen is soft.  Musculoskeletal:     Cervical back: Normal range of motion. No rigidity.     Right lower leg: Edema present.     Left lower leg: Edema present.  Skin:    Findings: Erythema (Stasis dermatitis) present.  Neurological:     General: No focal deficit present.     Mental Status: She is alert and oriented to person, place, and time.     Procedures  Procedures  ED Course / MDM    Medical Decision Making Amount and/or Complexity of Data Reviewed Radiology: ordered.  Risk Prescription drug management.   Patient underwent PICC line placement with IR.  She was discharged in stable condition.       Godfrey Pick, MD 12/26/21 [id number]

## 2021-12-26 NOTE — Discharge Instructions (Signed)
Continue home medications as prescribed.  Continue scheduled follow-up with your outpatient providers.  Return to the emergency department for any new or worsening symptoms of concern.

## 2021-12-26 NOTE — ED Notes (Signed)
RN repositioned pt and moved pt to recliner per pts request

## 2021-12-26 NOTE — ED Notes (Signed)
Pt transported to IR 

## 2021-12-29 ENCOUNTER — Ambulatory Visit: Payer: Medicaid Other

## 2021-12-31 ENCOUNTER — Ambulatory Visit: Payer: Medicaid Other

## 2021-12-31 NOTE — Telephone Encounter (Signed)
OT left vm for pt checking in d/t No show appointment on 12/26 and 12/28. Asked pt to please return call to follow up about future appointments and reminded pt to call clinic when needing to cancel or change an appointment.  Clinic number provided.

## 2022-01-04 DIAGNOSIS — J984 Other disorders of lung: Secondary | ICD-10-CM | POA: Diagnosis not present

## 2022-01-04 DIAGNOSIS — I270 Primary pulmonary hypertension: Secondary | ICD-10-CM | POA: Diagnosis not present

## 2022-01-04 DIAGNOSIS — G4730 Sleep apnea, unspecified: Secondary | ICD-10-CM | POA: Diagnosis not present

## 2022-01-04 DIAGNOSIS — I5030 Unspecified diastolic (congestive) heart failure: Secondary | ICD-10-CM | POA: Diagnosis not present

## 2022-01-08 ENCOUNTER — Ambulatory Visit: Payer: Medicaid Other | Admitting: Occupational Therapy

## 2022-01-11 ENCOUNTER — Ambulatory Visit: Payer: Medicaid Other

## 2022-01-12 ENCOUNTER — Ambulatory Visit: Payer: Medicaid Other | Admitting: *Deleted

## 2022-01-13 ENCOUNTER — Ambulatory Visit: Payer: Medicaid Other | Attending: Student in an Organized Health Care Education/Training Program

## 2022-01-13 ENCOUNTER — Other Ambulatory Visit: Payer: Self-pay | Admitting: Internal Medicine

## 2022-01-13 DIAGNOSIS — I50810 Right heart failure, unspecified: Secondary | ICD-10-CM

## 2022-01-13 DIAGNOSIS — I890 Lymphedema, not elsewhere classified: Secondary | ICD-10-CM | POA: Insufficient documentation

## 2022-01-13 DIAGNOSIS — I5033 Acute on chronic diastolic (congestive) heart failure: Secondary | ICD-10-CM

## 2022-01-13 DIAGNOSIS — G629 Polyneuropathy, unspecified: Secondary | ICD-10-CM

## 2022-01-14 NOTE — Therapy (Signed)
OUTPATIENT OCCUPATIONAL THERAPY TREATMENT NOTE BILATERAL LOWER EXTREMITY LYMPHEDEMA   Patient Name: [name] MRN: [id number] DOB:[birth year], 62 y.o., female Today's Date: 01/14/2022  END OF SESSION:  OT End of Session - 01/14/22 1628     Visit Number 4    Number of Visits 36    Date for OT Re-Evaluation 03/10/22    OT Start Time 9233    OT Stop Time 1630    OT Time Calculation (min) 60 min    Activity Tolerance Patient tolerated treatment well;No increased pain    Behavior During Therapy [facility] for tasks assessed/performed               Past Medical History:  Diagnosis Date   Acute on chronic diastolic (congestive) heart failure ([place]) 06/25/2013   COPD (chronic obstructive pulmonary disease) (HCC)    Cor pulmonale (HCC)    PA Peak pressure 52mHg   Depression    Diabetes mellitus    well controlled on metformin   External hemorrhoid 01/11/2020   GERD (gastroesophageal reflux disease)    H/O mental retardation    Hyperlipidemia    Hypertension    OSA (obstructive sleep apnea)    CPAP   Pulmonary hypertension (HCC)    Restrictive lung disease    PFTs 06/2012 (FVC 54% predicted and FEV1 68% predicted w minimal bronchodilator response).   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    [place]orn[place], ?followed up at wound care center, multiple courses of antibiotics in past for cellulitis, on lasix   Past Surgical History:  Procedure Laterality Date   CARDIAC CATHETERIZATION N/A 01/13/2016   Procedure: Right Heart Cath;  Surgeon: DJolaine Artist MD;  [facility];  Service: Cardiovascular;  Laterality: N/A;   CHOLECYSTECTOMY     COLONOSCOPY WITH PROPOFOL N/A 12/20/2019   Procedure: COLONOSCOPY WITH PROPOFOL;  Surgeon: JMilus Banister MD;  Location: [facility];  Service: Endoscopy;  Laterality: N/A;   IR FLUORO GUIDE CV LINE LEFT  11/16/2019   IR  FLUORO GUIDE CV LINE LEFT  06/17/2020   IR FLUORO GUIDE CV LINE LEFT  07/04/2020   IR FLUORO GUIDE CV LINE LEFT  07/03/2021   IR FLUORO GUIDE CV LINE LEFT  12/26/2021   IR FLUORO GUIDE CV LINE RIGHT  05/09/2019   IR FLUORO GUIDE CV LINE RIGHT  08/12/2019   IR REMOVAL TUN CV CATH W/O FL  05/05/2019   IR [place]ASC ACCESS LEFT  11/16/2019   IR [place] VASC ACCESS LEFT  06/17/2020   IR [place] VASC ACCESS LEFT  07/04/2020   IR [place] VASC ACCESS LEFT  07/03/2021   IR [place] VASC ACCESS LEFT  12/26/2021   IR [place] VASC ACCESS RIGHT  05/09/2019   IR [place] VASC ACCESS RIGHT  08/12/2019   IR [place] VASC ACCESS RIGHT  11/16/2019   IR VENO/JUGULAR RIGHT  11/16/2019   LEFT AND RIGHT HEART CATHETERIZATION WITH CORONARY ANGIOGRAM N/A 02/28/2014   Procedure: LEFT AND RIGHT HEART CATHETERIZATION WITH CORONARY ANGIOGRAM;  Surgeon: DJolaine Artist MD;  [facility];  Service: Cardiovascular;  Laterality: N/A;   RIGHT HEART CATH N/A 06/27/2018   Procedure: RIGHT HEART CATH;  Surgeon: BJolaine Artist MD;  [facility];  Service: Cardiovascular;  Laterality: N/A;   RIGHT/LEFT HEART CATH AND  CORONARY ANGIOGRAPHY N/A 05/14/2019   Procedure: RIGHT/LEFT HEART CATH AND CORONARY ANGIOGRAPHY;  Surgeon: Jolaine Artist, MD;  Location: [place] [facility];  Service: Cardiovascular;  Laterality: N/A;   Patient Active Problem List   Diagnosis Date Noted   Maceration of skin 10/10/2021   Chronic venous insufficiency 08/01/2021   Knee osteoarthritis 12/12/2020   Depression 09/02/2020   Left paraspinal back pain 05/05/2020   Hearing difficulty of both ears 05/05/2020   Internal hemorrhoid 01/11/2020   Fibroid, uterine 11/07/2019   Bleeding from the genitourinary system 09/13/2019   Urinary incontinence 12/12/2018   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[place])    Right ovarian cyst 05/19/2017   Rash 10/31/2014   Peripheral neuropathy ([place]) 09/27/2014    Healthcare maintenance 09/27/2014   Atrial flutter ([place]) 08/17/2012   Right heart failure, NYHA class 3 ([place]) 03/15/2012   Allergic rhinitis 11/16/2011   Diabetes ([place]) 10/15/2010   Hyperlipidemia 10/15/2010   Obstructive sleep apnea 02/21/2009   GERD 11/29/2005    PCP: Christiana Fuchs, DO  REFERRING PROVIDER: [name], MD  REFERRING DIAG: I89.0   THERAPY DIAG:  Lymphedema, not elsewhere classified  Rationale for Evaluation and Treatment: Rehabilitation  ONSET DATE: 12/08/21  SUBJECTIVE: Pt arrived an hour early today d/t transportation and pt thought her appointment was at 3:00 instead of 3:30.  When pt was taken back to OT, OT noted that pt's 02 tank was <1/4 full.  OT provided pt with full tank from cardio-pulmonary rehab to use during OT session.  Pt stated that the remaining 02 she had in her tank would be sufficient to get her home.  Pt also reported needing to use the restroom, so tx session was shortened to 45 min instead of 1 hour for reasons noted above.                                                                                                                                  SUBJECTIVE STATEMENT: [name] presents to OT for initial Intensive Phase CDT treatment to LLE  to address BLE phlebo-lymphedema.  Pt has no new complaints since initial evaluation. She denies LE-related leg pain. Pt was able to tolerate knee length compression wraps to LLE overnight without increased pain or other difficulty.    PERTINENT HISTORY: DM, HTN, CHF, OSA, Mental illness, COPD, OA knees, Hx stasis ulcer, Atrial Flutter; obesity, stage III CKD, Pulmonary HTN  FUNCTIONAL LIMITATIONS: Gait abnormality,  impaired functional ambulation, transfers and mobility, HOH, decreased standing and walking tolerance, impaired balance, urinary incontinence, OA knee pain and decreased AROM B knees and ankles, chronic leg swelling with associated pain, difficulty fitting street shoes and LB  clothing, difficulty reaching feet to bathe, inspect skin, groom nails and perform skin care, difficulty managing transportation and operating a cell phone, difficulty shopping, with meal prep and housekeeping  PAIN:  Are you having pain? No; pt reports tenderness  with deeper MLD in fibrotic areas of LLE, but tolerable.   PRECAUTIONS: Other: LYMPHEDEMA PRECAUTIONS ; Hx venous ulcer ; DM, [place]  PRIOR LEVEL OF FUNCTION: Independent with basic ADLs and Independent with household mobility without device  PATIENT GOALS: reduce leg swelling and limit skin breakdown   OBJECTIVE:  OBSERVATIONS /ASSESSMENTS:   LYMPHEDEMA LIFE IMPACT SCALE (LLIS):  12/24/21 initial 49.55%    FOTO functional outcomes score:  TBA   BLE COMPARATIVE LIMB VOLUMETRICS:   LANDMARK RIGHT  12/24/21 INITIAL  R LEG (A-D) (Dominant) 6449.8 ml  R THIGH (E-G) 7051.0 ml  R FULL LIMB (A-G) 13500.9 ml  Limb Volume differential (LVD)  %  Volume change since initial %  Volume change overall V  (Blank rows = not tested)  LANDMARK LEFT  12/24/21 INITIAL  R LEG (A-D) 6673.4 ml  R THIGH (E-G) 6996.2 ml  R FULL LIMB (A-G) 13669.6 ml  Limb Volume differential (LVD)  Leg LVD= 3%, L>R, thigh LVD= 7%, R>L,  Full limb LVD= 1.23%, L>R%  Volume change since initial %  Volume change overall %  (Blank rows = not tested)   TODAY'S TREATMENT:                                                                                                 Performed LLE/LLQ MLD.  Focussed on softening fibrotic areas to anterior and posterior L lower leg.  Instructed pt in self MLD techniques to BLEs.  Additional training needed, but pt demos fair ability to reach lower leg while long sitting with a slight knee bend, and good return demo for self MLD to medial thighs.  When compression wraps are donned up to the knee, OT encouraged pt perform self MLD to the medial thigh, routing to L inguinal nodes.  Reinforced benefits of ankle pumps to facilitate lymph  flow out of the foot and lower leg.   Multilayer compression wraps to LLE below the knee.  OT provided velcro extension to accommodate wrapped L foot in velcro slip on sandal.    PATIENT EDUCATION:  Intro level simple self MLD, cont bandaging edu; restart pump to facilitate BLE lymphatic flow (3x/week, progressing to daily pumping).  Stop pumping if SOB.  Try 1 leg at a time if SOB (pt states she's not been SOB when pumping, but acknowledged recommendation). Person educated: Patient Education method: Explanation, Demonstration, Tactile cues, Verbal cues, and Handouts Education comprehension: verbalized understanding, returned demonstration, verbal cues required, and needs further education  LYMPHEDEMA SELF-CARE HOME PROGRAM:(Intensive and self-management phases of CDT) 1.Simple Self-MLD to BLE/BLQ daily 2. Compression: short stretch multi layer wraps on one leg at a time only during Intensive Phase CDT; Fit with appropriate compression garments during self-management phase. 3. Daily skin care 4. Lymphatic pumping there ex- 1 x daily, 10 reps of each, in order 5. Flexitouch advanced sequential pneumatic compression device, or "pump"  ASSESSMENT:  CLINICAL IMPRESSION: Pt seen this date for the first time in 2 weeks, last session being 12/25/21.  Transportation continues to be a barrier to pt attending treatment sessions consistently.  Tx session limited  to 45 min d/t pt with a low 02 tank and needing replacement, and pt needing to use the restroom.  Pt arrived without wraps donned d/t not having a caregiver to assist her with wraps.  Pt stated that her son could wrap her if needed.  Will plan to provide written instructions for wrapping next session as son is unable to attend sessions d/t work.  Pt stated that she has a pump at home, but has used it maybe 1x in the last month.  OT encouraged pt start working up to pumping 3x weekly, daily when able.  OT reinforced importance of consistent therapy  participation at least 2x per week to benefit from skilled therapy.  Pt was encouraged to look ahead at her schedule to ensure she call transportation with sufficient notice to attend her scheduled visits.  Pt verbalized understanding.  Performed MLD to LLE/LLQ utilizing short neck sequence, deep abdominal LN, functional inguinal watershed, and proximal to distal J strokes to decongest thigh first, then bottleneck region, then leg and lastly, ankle and foot.  Reapplied compression wraps as established with single layer Rosidal foam over cotton stockinett, then Artiflex to bump out the ankle to create a columnar shaped leg (vs an inverted come). Applied  one each 8, 10 and 12 cm wide short stretch wraps using gradient techniques, and covered tape with stretch net. Cont as per POC.  OBJECTIVE IMPAIRMENTS: Abnormal gait, decreased balance, decreased cognition, decreased knowledge of condition, decreased knowledge of use of DME, decreased mobility, decreased ROM, increased edema, impaired flexibility, impaired sensation, obesity, and pain.   ACTIVITY LIMITATIONS: sitting, standing, squatting, stairs, transfers, bed mobility, continence, bathing, toileting, and dressing  PARTICIPATION LIMITATIONS: meal prep, cleaning, laundry, driving, shopping, community activity, yard work, and Social participation limited due to increased lag pain and swelling with standing , walking, and or dependent sitting > 15 min.  PERSONAL FACTORS: Age, Behavior pattern, Time since onset of injury/illness/exacerbation, Transportation, 3+ comorbidities:  , and hx of poor attendenace and non compliance with previous  CDT  are also affecting patient's functional outcome.   REHAB POTENTIAL: Good  EVALUATION COMPLEXITY: Moderate   GOALS: Goals reviewed with patient? Yes  SHORT TERM GOALS: Target date: 4th OT Rx visit   Pt will demonstrate understanding of lymphedema precautions and prevention strategies with modified independence  using a printed reference to identify at least 5 precautions and discussing how s/he may implement them into daily life to reduce risk of progression with min A. Baseline: Max A Goal status: INITIAL  2.   With Max caregiver assistance  x 1 Pt will be able to apply multilayer, knee length, compression wraps using gradient techniques to decrease limb volume, to limit infection risk, and to limit lymphedema progression.  Baseline: Dependent Goal status: INITIAL  LONG TERM GOALS: Target date: 03/08/22  Given this patient's Intake score of 44.12% on the Lymphedema Life Impact Scale (LLIS), patient will experience a reduction of at least 5% in her perceived level of functional impairment resulting from lymphedema to improve functional performance and quality of life (QOL). Baseline: 44.12% Goal status: INITIAL  2.  Given this patient's Intake score of TBA/100% on the functional outcomes FOTO tool, patient will experience an increase in function of 5 points to improve basic and instrumental ADLs performance, including lymphedema self-care. Baseline:  (TBA at first OT Rx visit) Goal status: INITIAL  3.   With Max caregiver assistance  Pt will be able to apply multilayer, knee length, compression wraps  using gradient techniques to decrease limb volume, to limit infection risk, and to limit lymphedema progression.  Baseline: Dependent Goal status: INITIAL  4.  With modified assistance ( assistive devices and extra time PRN) Pt will be able to don and doff appropriate compression garments and/or devices to control BLE lymphedema and to limit progression.  Baseline: Dependent Goal status: INITIAL  5.  Pt will achieve at least a 10% volume reductions bilaterally below the knees to return limb to more typical size and shape, to limit infection risk and LE progression, to decrease pain, to improve function, and to improve body image and QOL. Baseline: Dependent Goal status: INITIAL  6.   With Max  caregiver assistance Pt will achieve and sustain no less than 85% compliance with all LE self-care home program components throughout CDT, including modified simple self-MLD, daily skin care and inspection, lymphatic pumping the ex and appropriate compression to limit lymphedema progression and to limit further functional decline. Baseline: Dependent Goal status: INITIAL   PLAN:  PT FREQUENCY: 3x/week  PT DURATION: other: and PRN  PLANNED INTERVENTIONS: Therapeutic exercises, Therapeutic activity, Patient/Family education, Self Care, DME instructions, Manual lymph drainage, Compression bandaging, Taping, Manual therapy, and skin care with low ph castor oil and/ or lotion matching PH of skin to limit infection and mobilize skin, scar massage  PLAN FOR NEXT SESSION:  Initial BLE comparative limb volumetrics Multi layer compression wraps to LLE below the knee Pt edu for LE self care  Leta Speller, MS, OTR/L, CLT   01/14/22 4:31 PM

## 2022-01-19 ENCOUNTER — Ambulatory Visit: Payer: Medicaid Other

## 2022-01-19 DIAGNOSIS — I890 Lymphedema, not elsewhere classified: Secondary | ICD-10-CM | POA: Diagnosis not present

## 2022-01-20 NOTE — Therapy (Signed)
OUTPATIENT OCCUPATIONAL THERAPY TREATMENT NOTE BILATERAL LOWER EXTREMITY LYMPHEDEMA   Patient Name: [name] MRN: [id number] DOB:12/02/1960, 62 y.o., female Today's Date: 01/20/2022  END OF SESSION:  OT End of Session - 01/20/22 1450     Visit Number 5    Number of Visits 36    Date for OT Re-Evaluation 03/10/22    OT Start Time 1000    OT Stop Time 1100    OT Time Calculation (min) 60 min    Activity Tolerance Patient tolerated treatment well;No increased pain    Behavior During Therapy [facility] for tasks assessed/performed               Past Medical History:  Diagnosis Date   Acute on chronic diastolic (congestive) heart failure ([place]) 06/25/2013   COPD (chronic obstructive pulmonary disease) (HCC)    Cor pulmonale (HCC)    PA Peak pressure 69mHg   Depression    Diabetes mellitus    well controlled on metformin   External hemorrhoid 01/11/2020   GERD (gastroesophageal reflux disease)    H/O mental retardation    Hyperlipidemia    Hypertension    OSA (obstructive sleep apnea)    CPAP   Pulmonary hypertension (HCC)    Restrictive lung disease    PFTs 06/2012 (FVC 54% predicted and FEV1 68% predicted w minimal bronchodilator response).   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 [place] ([place]    chornic, ?followed up at wound care center, multiple courses of antibiotics in past for cellulitis, on lasix   Past Surgical History:  Procedure Laterality Date   CARDIAC CATHETERIZATION N/A 01/13/2016   Procedure: Right Heart Cath;  Surgeon: DJolaine Artist MD;  [facility];  Service: Cardiovascular;  Laterality: N/A;   CHOLECYSTECTOMY     COLONOSCOPY WITH PROPOFOL N/A 12/20/2019   Procedure: COLONOSCOPY WITH PROPOFOL;  Surgeon: JMilus Banister MD;  Location: [facility];  Service: Endoscopy;  Laterality: N/A;   IR FLUORO GUIDE CV LINE LEFT  11/16/2019   IR  FLUORO GUIDE CV LINE LEFT  06/17/2020   IR FLUORO GUIDE CV LINE LEFT  07/04/2020   IR FLUORO GUIDE CV LINE LEFT  07/03/2021   IR FLUORO GUIDE CV LINE LEFT  12/26/2021   IR FLUORO GUIDE CV LINE RIGHT  05/09/2019   IR FLUORO GUIDE CV LINE RIGHT  08/12/2019   IR REMOVAL TUN CV CATH W/O FL  05/05/2019   IR [place] ACCESS LEFT  11/16/2019   IR [place] VASC ACCESS LEFT  06/17/2020   IR [place] VASC ACCESS LEFT  07/04/2020   IR [place] VASC ACCESS LEFT  07/03/2021   IR [place] VASC ACCESS LEFT  12/26/2021   IR [place] VASC ACCESS RIGHT  05/09/2019   IR [place] VASC ACCESS RIGHT  08/12/2019   IR [place] VASC ACCESS RIGHT  11/16/2019   IR VENO/JUGULAR RIGHT  11/16/2019   LEFT AND RIGHT HEART CATHETERIZATION WITH CORONARY ANGIOGRAM N/A 02/28/2014   Procedure: LEFT AND RIGHT HEART CATHETERIZATION WITH CORONARY ANGIOGRAM;  Surgeon: DJolaine Artist MD;  [facility];  Service: Cardiovascular;  Laterality: N/A;   RIGHT HEART CATH N/A 06/27/2018   Procedure: RIGHT HEART CATH;  Surgeon: BJolaine Artist MD;  [facility];  Service: Cardiovascular;  Laterality: N/A;   RIGHT/LEFT HEART CATH AND  CORONARY ANGIOGRAPHY N/A 05/14/2019   Procedure: RIGHT/LEFT HEART CATH AND CORONARY ANGIOGRAPHY;  Surgeon: Jolaine Artist, MD;  [place] [facility];  Service: Cardiovascular;  Laterality: N/A;   Patient Active Problem List   Diagnosis Date Noted   Maceration of skin 10/10/2021   Chronic venous insufficiency 08/01/2021   Knee osteoarthritis 12/12/2020   Depression 09/02/2020   Left paraspinal back pain 05/05/2020   Hearing difficulty of both ears 05/05/2020   Internal hemorrhoid 01/11/2020   Fibroid, uterine 11/07/2019   Bleeding from the genitourinary system 09/13/2019   Urinary incontinence 12/12/2018   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[place])    Right ovarian cyst 05/19/2017   Rash 10/31/2014   Peripheral neuropathy ([place]) 09/27/2014    Healthcare maintenance 09/27/2014   Atrial flutter ([place]) 08/17/2012   Right heart failure, NYHA class 3 ([place]) 03/15/2012   Allergic rhinitis 11/16/2011   Diabetes ([place]) 10/15/2010   Hyperlipidemia 10/15/2010   Obstructive sleep apnea 02/21/2009   GERD 11/29/2005    PCP: Christiana Fuchs, DO  REFERRING PROVIDER: [name], MD  REFERRING DIAG: I89.0   THERAPY DIAG:  Lymphedema, not elsewhere classified  Rationale for Evaluation and Treatment: Rehabilitation  ONSET DATE: 12/08/21  SUBJECTIVE: Pt arrived with 2 02 tanks nearly empty.  OT reminded pt that she needs to check that her tanks are sufficiently full to make it through her therapy session and her ride home each time she comes for therapy.  OT provided supplemental tank from cardio/pulmonary rehab to use during therapy session.  Pt stated that she would have enough 02 between her 2 tanks for her ride home.                                                                                                                                SUBJECTIVE STATEMENT: [name] presents to OT for initial Intensive Phase CDT treatment to LLE  to address BLE phlebo-lymphedema.  Pt has no new complaints since initial evaluation. She denies LE-related leg pain. Pt was able to tolerate knee length compression wraps to LLE overnight without increased pain or other difficulty.    PERTINENT HISTORY: DM, HTN, CHF, OSA, Mental illness, COPD, OA knees, Hx stasis ulcer, Atrial Flutter; obesity, stage III CKD, Pulmonary HTN  FUNCTIONAL LIMITATIONS: Gait abnormality,  impaired functional ambulation, transfers and mobility, HOH, decreased standing and walking tolerance, impaired balance, urinary incontinence, OA knee pain and decreased AROM B knees and ankles, chronic leg swelling with associated pain, difficulty fitting street shoes and LB clothing, difficulty reaching feet to bathe, inspect skin, groom nails and perform skin care, difficulty  managing transportation and operating a cell phone, difficulty shopping, with meal prep and housekeeping  PAIN:  Are you having pain? No; pt reports tenderness with deeper MLD in fibrotic areas of LLE, but tolerable.   PRECAUTIONS: Other: LYMPHEDEMA PRECAUTIONS ; Hx venous ulcer ; DM, [place]  PRIOR LEVEL OF  FUNCTION: Independent with basic ADLs and Independent with household mobility without device  PATIENT GOALS: reduce leg swelling and limit skin breakdown   OBJECTIVE:  OBSERVATIONS /ASSESSMENTS:   LYMPHEDEMA LIFE IMPACT SCALE (LLIS):  12/24/21 initial 49.55%    FOTO functional outcomes score:  TBA   BLE COMPARATIVE LIMB VOLUMETRICS:   LANDMARK RIGHT  12/24/21 INITIAL  R LEG (A-D) (Dominant) 6449.8 ml  R THIGH (E-G) 7051.0 ml  R FULL LIMB (A-G) 13500.9 ml  Limb Volume differential (LVD)  %  Volume change since initial %  Volume change overall V  (Blank rows = not tested)  LANDMARK LEFT  12/24/21 INITIAL  R LEG (A-D) 6673.4 ml  R THIGH (E-G) 6996.2 ml  R FULL LIMB (A-G) 13669.6 ml  Limb Volume differential (LVD)  Leg LVD= 3%, L>R, thigh LVD= 7%, R>L,  Full limb LVD= 1.23%, L>R%  Volume change since initial %  Volume change overall %  (Blank rows = not tested)   TODAY'S TREATMENT:                                                                                                 Performed LLE/LLQ MLD.  Focussed on softening fibrotic areas to anterior and posterior L lower leg.   Multilayer compression wraps to LLE below the knee.  Velcro extension to accommodate wrapped L foot in velcro slip on sandal continues to work well to allow sandal to fit properly.    PATIENT EDUCATION:  Intro level simple self MLD, cont bandaging edu; restart pump to facilitate BLE lymphatic flow (3x/week, progressing to daily pumping).  Stop pumping if SOB.  Try 1 leg at a time if SOB (pt states she's not been SOB when pumping, but acknowledged recommendation). Person educated: Patient Education  method: Explanation, Demonstration, Tactile cues, Verbal cues, and Handouts Education comprehension: verbalized understanding, returned demonstration, verbal cues required, and needs further education  LYMPHEDEMA SELF-CARE HOME PROGRAM:(Intensive and self-management phases of CDT) 1.Simple Self-MLD to BLE/BLQ daily 2. Compression: short stretch multi layer wraps on one leg at a time only during Intensive Phase CDT; Fit with appropriate compression garments during self-management phase. 3. Daily skin care 4. Lymphatic pumping there ex- 1 x daily, 10 reps of each, in order 5. Flexitouch advanced sequential pneumatic compression device, or "pump"  ASSESSMENT:  CLINICAL IMPRESSION: Pt arrived again without sufficient 02, despite 2 of her own tanks with her today.  Provided supplemental tank from cardio/pulmonary rehab to use during session and reminded pt to make sure to check her tanks for sufficient 02 to/from/during therapy sessions.  Pt felt she had enough 02 to make it home between her 2 tanks.  Noted compression wraps to still be in place to LLE upon pt arrival.  Seeping through wraps was noted.  Pt stated that she had not doffed bandages from OT visit 6 days ago.  OT removed bandages, assisted to wash leg to optimize skin care, and reinforced importance of more regular and sufficient hygiene to prevent infection.  Encouraged pt remove bandages about every 2 days, or daily if weeping.  Performed MLD to  LLE/LLQ utilizing short neck sequence, deep abdominal LN, functional inguinal watershed, and proximal to distal J strokes to decongest thigh first, then bottleneck region, then leg and lastly, ankle and foot.  Pt did not remember to bring a 2nd set of clean bandages with her today.  OT provided a second set today and ended session with bandaging pt with this clean set.  Weeping area appeared to be from anterior lower leg, though was no longer weeping at time of session.  OT did provide gauze to this  area in case weeping began again while pt was bandaged.  Compression wraps sequence consisted of single layer Rosidal foam over cotton stockinett, then Artiflex to bump out the ankle to create a columnar shaped leg (vs an inverted come).  Applied  one each 8, 10 and 12 cm wide short stretch wraps using gradient techniques, and covered tape with stretch net. OT provided photo of each sequence on pt's phone per her permission.  OT also provided written instructions for wrapping sequence for caregiver carryover, though son will need to attend at least 1 session for sufficient education as pt is unable to bandage her own leg.  Pt reports that she forgot to pump her legs since last OT session.  RLE did appear more red, though was not warm to touch.  OT strongly encouraged pt begin pumping RLE daily to prevent any worsening fluid build up.  L lower leg was less fibrotic/more supple this date, specifically at top of calf and ankles.  OT provided written reminders/schedule for removing bandages, washing bandages, bathing her legs, re-wrapping the legs with son's assist, and pumping, as well as written reminders for checking her 02 tanks for sufficient 02 and bringing a set of clean bandages to be ready for next OT session.  Cont as per POC.  OBJECTIVE IMPAIRMENTS: Abnormal gait, decreased balance, decreased cognition, decreased knowledge of condition, decreased knowledge of use of DME, decreased mobility, decreased ROM, increased edema, impaired flexibility, impaired sensation, obesity, and pain.   ACTIVITY LIMITATIONS: sitting, standing, squatting, stairs, transfers, bed mobility, continence, bathing, toileting, and dressing  PARTICIPATION LIMITATIONS: meal prep, cleaning, laundry, driving, shopping, community activity, yard work, and Social participation limited due to increased lag pain and swelling with standing , walking, and or dependent sitting > 15 min.  PERSONAL FACTORS: Age, Behavior pattern, Time since  onset of injury/illness/exacerbation, Transportation, 3+ comorbidities:  , and hx of poor attendenace and non compliance with previous  CDT  are also affecting patient's functional outcome.   REHAB POTENTIAL: Good  EVALUATION COMPLEXITY: Moderate   GOALS: Goals reviewed with patient? Yes  SHORT TERM GOALS: Target date: 4th OT Rx visit   Pt will demonstrate understanding of lymphedema precautions and prevention strategies with modified independence using a printed reference to identify at least 5 precautions and discussing how s/he may implement them into daily life to reduce risk of progression with min A. Baseline: Max A Goal status: INITIAL  2.   With Max caregiver assistance  x 1 Pt will be able to apply multilayer, knee length, compression wraps using gradient techniques to decrease limb volume, to limit infection risk, and to limit lymphedema progression.  Baseline: Dependent Goal status: INITIAL  LONG TERM GOALS: Target date: 03/08/22  Given this patient's Intake score of 44.12% on the Lymphedema Life Impact Scale (LLIS), patient will experience a reduction of at least 5% in her perceived level of functional impairment resulting from lymphedema to improve functional performance and quality of life (  QOL). Baseline: 44.12% Goal status: INITIAL  2.  Given this patient's Intake score of TBA/100% on the functional outcomes FOTO tool, patient will experience an increase in function of 5 points to improve basic and instrumental ADLs performance, including lymphedema self-care. Baseline:  (TBA at first OT Rx visit) Goal status: INITIAL  3.   With Max caregiver assistance  Pt will be able to apply multilayer, knee length, compression wraps using gradient techniques to decrease limb volume, to limit infection risk, and to limit lymphedema progression.  Baseline: Dependent Goal status: INITIAL  4.  With modified assistance ( assistive devices and extra time PRN) Pt will be able to don and  doff appropriate compression garments and/or devices to control BLE lymphedema and to limit progression.  Baseline: Dependent Goal status: INITIAL  5.  Pt will achieve at least a 10% volume reductions bilaterally below the knees to return limb to more typical size and shape, to limit infection risk and LE progression, to decrease pain, to improve function, and to improve body image and QOL. Baseline: Dependent Goal status: INITIAL  6.   With Max caregiver assistance Pt will achieve and sustain no less than 85% compliance with all LE self-care home program components throughout CDT, including modified simple self-MLD, daily skin care and inspection, lymphatic pumping the ex and appropriate compression to limit lymphedema progression and to limit further functional decline. Baseline: Dependent Goal status: INITIAL   PLAN:  PT FREQUENCY: 3x/week  PT DURATION: other: and PRN  PLANNED INTERVENTIONS: Therapeutic exercises, Therapeutic activity, Patient/Family education, Self Care, DME instructions, Manual lymph drainage, Compression bandaging, Taping, Manual therapy, and skin care with low ph castor oil and/ or lotion matching PH of skin to limit infection and mobilize skin, scar massage  PLAN FOR NEXT SESSION:  Initial BLE comparative limb volumetrics Multi layer compression wraps to LLE below the knee Pt edu for LE self care   Leta Speller, MS, OTR/L, CLT   01/20/22 2:52 PM

## 2022-01-21 ENCOUNTER — Encounter: Payer: Self-pay | Admitting: Occupational Therapy

## 2022-01-21 ENCOUNTER — Ambulatory Visit: Payer: Medicaid Other | Admitting: Occupational Therapy

## 2022-01-21 DIAGNOSIS — I890 Lymphedema, not elsewhere classified: Secondary | ICD-10-CM | POA: Diagnosis not present

## 2022-01-21 NOTE — Therapy (Signed)
OUTPATIENT OCCUPATIONAL THERAPY TREATMENT NOTE BILATERAL LOWER EXTREMITY LYMPHEDEMA   Patient Name: [name] MRN: [id number] DOB:1960-03-14, 62 y.o., female Today's Date: 01/21/2022  END OF SESSION:  OT End of Session - 01/21/22 0917     Visit Number 6    Number of Visits 36    Date for OT Re-Evaluation 03/10/22    OT Start Time 0900    Activity Tolerance Patient tolerated treatment well;No increased pain    Behavior During Therapy [facility] for tasks assessed/performed               Past Medical History:  Diagnosis Date   Acute on chronic diastolic (congestive) heart failure ([place]) 06/25/2013   COPD (chronic obstructive pulmonary disease) (HCC)    Cor pulmonale (HCC)    PA Peak pressure 29mHg   Depression    Diabetes mellitus    well controlled on metformin   External hemorrhoid 01/11/2020   GERD (gastroesophageal reflux disease)    H/O mental retardation    Hyperlipidemia    Hypertension    OSA (obstructive sleep apnea)    CPAP   Pulmonary hypertension (HCC)    Restrictive lung disease    PFTs 06/2012 (FVC 54% predicted and FEV1 68% predicted w minimal bronchodilator response).   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 ulcer ([facility]    chornic, ?followed up at wound care center, multiple courses of antibiotics in past for cellulitis, on lasix   Past Surgical History:  Procedure Laterality Date   CARDIAC CATHETERIZATION N/A 01/13/2016   Procedure: Right Heart Cath;  Surgeon: DJolaine Artist MD;  [facility];  Service: Cardiovascular;  Laterality: N/A;   CHOLECYSTECTOMY     COLONOSCOPY WITH PROPOFOL N/A 12/20/2019   Procedure: COLONOSCOPY WITH PROPOFOL;  Surgeon: JMilus Banister MD;  Location: [facility];  Service: Endoscopy;  Laterality: N/A;   IR FLUORO GUIDE CV LINE LEFT  11/16/2019   IR FLUORO GUIDE CV LINE LEFT  06/17/2020   IR FLUORO GUIDE CV  LINE LEFT  07/04/2020   IR FLUORO GUIDE CV LINE LEFT  07/03/2021   IR FLUORO GUIDE CV LINE LEFT  12/26/2021   IR FLUORO GUIDE CV LINE RIGHT  05/09/2019   IR FLUORO GUIDE CV LINE RIGHT  08/12/2019   IR REMOVAL TUN CV CATH W/O FL  05/05/2019   IR [place] ACCESS LEFT  11/16/2019   IR [place] VASC ACCESS LEFT  06/17/2020   IR [place] VASC ACCESS LEFT  07/04/2020   IR [place] VASC ACCESS LEFT  07/03/2021   IR [place] VASC ACCESS LEFT  12/26/2021   IR [place] VASC ACCESS RIGHT  05/09/2019   IR [place] VASC ACCESS RIGHT  08/12/2019   IR [place] VASC ACCESS RIGHT  11/16/2019   IR VENO/JUGULAR RIGHT  11/16/2019   LEFT AND RIGHT HEART CATHETERIZATION WITH CORONARY ANGIOGRAM N/A 02/28/2014   Procedure: LEFT AND RIGHT HEART CATHETERIZATION WITH CORONARY ANGIOGRAM;  Surgeon: DJolaine Artist MD;  [facility];  Service: Cardiovascular;  Laterality: N/A;   RIGHT HEART CATH N/A 06/27/2018   Procedure: RIGHT HEART CATH;  Surgeon: BJolaine Artist MD;  [place];  Service: Cardiovascular;  Laterality: N/A;   RIGHT/LEFT HEART CATH AND CORONARY ANGIOGRAPHY N/A 05/14/2019   Procedure: RIGHT/LEFT HEART CATH AND CORONARY ANGIOGRAPHY;  Surgeon: BHaroldine Laws  [name], MD;  Location: [place] [facility];  Service: Cardiovascular;  Laterality: N/A;   Patient Active Problem List   Diagnosis Date Noted   Maceration of skin 10/10/2021   Chronic venous insufficiency 08/01/2021   Knee osteoarthritis 12/12/2020   Depression 09/02/2020   Left paraspinal back pain 05/05/2020   Hearing difficulty of both ears 05/05/2020   Internal hemorrhoid 01/11/2020   Fibroid, uterine 11/07/2019   Bleeding from the genitourinary system 09/13/2019   Urinary incontinence 12/12/2018   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[place])    Right ovarian cyst 05/19/2017   Rash 10/31/2014   Peripheral neuropathy ([place]) 09/27/2014   Healthcare maintenance 09/27/2014   Atrial flutter ([place])  08/17/2012   Right heart failure, NYHA class 3 ([place]) 03/15/2012   Allergic rhinitis 11/16/2011   Diabetes ([place]) 10/15/2010   Hyperlipidemia 10/15/2010   Obstructive sleep apnea 02/21/2009   GERD 11/29/2005    PCP: Christiana Fuchs, DO  REFERRING PROVIDER: [name], MD  REFERRING DIAG: I89.0   THERAPY DIAG:  Lymphedema, not elsewhere classified    ONSET DATE: 12/08/21  SUBJECTIVE:                                                                                                                                                                                          SUBJECTIVE STATEMENT: [name] presents to OT for initial Intensive Phase CDT treatment to LLE  to address BLE phlebo-lymphedema.  Pt has no new complaints since initial evaluation. She denies LE-related leg pain. Pt was able to tolerate knee length compression wraps to LLE overnight without increased pain or other difficulty. Pt does not bring clean wraps  to visit. Wraps applied 2 days ago at least visit remain in place.Pt and OT had a frank discussion before commencing manual therapy about need to attend visits with enough 02 to get to and from clinic and to compllete session.Discussed importance of having assistance with wraps between visits to limit infection risk and reduce limb swelling. Discussed importance of using the restroom before session commences since our time is limited for therapy.  PERTINENT HISTORY: DM, HTN, CHF, OSA, Mental illness, COPD, OA knees, Hx stasis ulcer, Atrial Flutter; obesity, stage III CKD, Pulmonary HTN  FUNCTIONAL LIMITATIONS: Gait abnormality,  impaired functional ambulation, transfers and mobility, HOH, decreased standing and walking tolerance, impaired balance, urinary incontinence, OA knee pain and decreased AROM B knees and ankles, chronic leg swelling with associated pain, difficulty fitting street shoes and LB clothing, difficulty reaching feet to bathe, inspect skin, groom  nails and perform skin care, difficulty managing transportation and operating a cell phone,  difficulty shopping, with meal prep and housekeeping  PAIN:  Are you having pain? No  PRECAUTIONS: Other: LYMPHEDEMA PRECAUTIONS ; Hx venous ulcer ; DM, [place]  PRIOR LEVEL OF FUNCTION: Independent with basic ADLs and Independent with household mobility without device  PATIENT GOALS: reduce leg swelling and limit skin breakdown   OBJECTIVE:  OBSERVATIONS /ASSESSMENTS:   LYMPHEDEMA LIFE IMPACT SCALE (LLIS):  12/24/21 initial 49.55%    FOTO functional outcomes score:  TBA   BLE COMPARATIVE LIMB VOLUMETRICS:   LANDMARK RIGHT  12/24/21 INITIAL  R LEG (A-D) (Dominant) 6449.8 ml  R THIGH (E-G) 7051.0 ml  R FULL LIMB (A-G) 13500.9 ml  Limb Volume differential (LVD)  %  Volume change since initial %  Volume change overall V  (Blank rows = not tested)  LANDMARK LEFT  12/24/21 INITIAL  R LEG (A-D) 6673.4 ml  R THIGH (E-G) 6996.2 ml  R FULL LIMB (A-G) 13669.6 ml  Limb Volume differential (LVD)  Leg LVD= 3%, L>R, thigh LVD= 7%, R>L,  Full limb LVD= 1.23%, L>R%  Volume change since initial %  Volume change overall %  (Blank rows = not tested)   TODAY'S TREATMENT:     1. Pt edu for importance of preparing for all rehab visits    by making sure she brings clean, rolled compression wraps, had enough oxygen to travel to and from home and for the 1 hr session, and for using the restroom before OT session. Pt and OT agree we hope to maximize clinical time for the best possible outcome, not spend skilled clinical time on organizational tasks, and non-skilled activities, etc. Great onversation and made [employer] together of all elements needed to prep for session.  2. LLE/LLQ MLD 3. Multilayer compression wraps to LLE below the knee  PATIENT EDUCATION:  Intro level simple self MLD, bandaging edu, lymphedema self-care and home program, oraginzing / preparing for sessions, and made a cell phone video for  Pt's son in hopes that he can assist her with compression wraps between visits. Person educated: Patient Education method: Explanation, Demonstration, Tactile cues, Verbal cues, and Handouts Education comprehension: verbalized understanding, returned demonstration, verbal cues required, and needs further education  LYMPHEDEMA SELF-CARE HOME PROGRAM:(Intensive and self-management phases of CDT) 1.Simple Self-MLD to BLE/BLQ daily 2. Compression: short stretch multi layer wraps on one leg at a time only during Intensive Phase CDT; Fit with appropriate compression garments during self-management phase. 3. Daily skin care 4. Lymphatic pumping there ex- 1 x daily, 10 reps of each, in order 5. Flexitouch advanced sequential pneumatic compression device, or "pump"  ASSESSMENT:  CLINICAL IMPRESSION: Emphasis of Pt edu today was on importance of preparing for all rehab visits  by making sure she brings clean, rolled compression wraps, had enough oxygen to travel to and from home and for the 1 hr session, and for using the restroom before OT session. Pt and OT agree we hope to maximize clinical time for the best possible outcome, not spend skilled clinical time on organizational tasks, and non-skilled activities, etc. Great onversation and made [employer] together of all elements needed to prep for session. Compression wraps applied in clinic yesterday removed this morning to reveal a very good response to compression therapy. Swelling below the knee is dramatically reduced. Commenced MLD to LLE/LLQ utilizing short neck sequence, deep abdominal LN, functional inguinal watershed, and proximal to distal J strokes to decongest thigh first, then bottleneck region, then leg and lastly, ankle and foot. Reapplied compression wraps  as established with single layer Rosidal foam over cotton stockinett, then Artiflex to     bump out the ankle to create a columnar shaped leg (vs an inverted come). Applied  one each 8, 10 and  12 cm wide short stretch wraps using gradient techniques, and covered tap with stretch net. Pt agrees to remove wraps in 24 hours. Cont as per POC.    OBJECTIVE IMPAIRMENTS: Abnormal gait, decreased balance, decreased cognition, decreased knowledge of condition, decreased knowledge of use of DME, decreased mobility, decreased ROM, increased edema, impaired flexibility, impaired sensation, obesity, and pain.   ACTIVITY LIMITATIONS: sitting, standing, squatting, stairs, transfers, bed mobility, continence, bathing, toileting, and dressing  PARTICIPATION LIMITATIONS: meal prep, cleaning, laundry, driving, shopping, community activity, yard work, and Social participation limited due to increased lag pain and swelling with standing , walking, and or dependent sitting > 15 min.  PERSONAL FACTORS: Age, Behavior pattern, Time since onset of injury/illness/exacerbation, Transportation, 3+ comorbidities:  , and hx of poor attendenace and non compliance with previous  CDT  are also affecting patient's functional outcome.   REHAB POTENTIAL: Good  EVALUATION COMPLEXITY: Moderate   GOALS: Goals reviewed with patient? Yes  SHORT TERM GOALS: Target date: 4th OT Rx visit   Pt will demonstrate understanding of lymphedema precautions and prevention strategies with modified independence using a printed reference to identify at least 5 precautions and discussing how s/he may implement them into daily life to reduce risk of progression with min A. Baseline: Max A Goal status: INITIAL  2.   With Max caregiver assistance  x 1 Pt will be able to apply multilayer, knee length, compression wraps using gradient techniques to decrease limb volume, to limit infection risk, and to limit lymphedema progression.  Baseline: Dependent Goal status: INITIAL  LONG TERM GOALS: Target date: 03/08/22  Given this patient's Intake score of 44.12% on the Lymphedema Life Impact Scale (LLIS), patient will experience a reduction of  at least 5% in her perceived level of functional impairment resulting from lymphedema to improve functional performance and quality of life (QOL). Baseline: 44.12% Goal status: INITIAL  2.  Given this patient's Intake score of TBA/100% on the functional outcomes FOTO tool, patient will experience an increase in function of 5 points to improve basic and instrumental ADLs performance, including lymphedema self-care. Baseline:  (TBA at first OT Rx visit) Goal status: INITIAL  3.   With Max caregiver assistance  Pt will be able to apply multilayer, knee length, compression wraps using gradient techniques to decrease limb volume, to limit infection risk, and to limit lymphedema progression.  Baseline: Dependent Goal status: INITIAL  4.  With modified assistance ( assistive devices and extra time PRN) Pt will be able to don and doff appropriate compression garments and/or devices to control BLE lymphedema and to limit progression.  Baseline: Dependent Goal status: INITIAL  5.  Pt will achieve at least a 10% volume reductions bilaterally below the knees to return limb to more typical size and shape, to limit infection risk and LE progression, to decrease pain, to improve function, and to improve body image and QOL. Baseline: Dependent Goal status: INITIAL  6.   With Max caregiver assistance Pt will achieve and sustain no less than 85% compliance with all LE self-care home program components throughout CDT, including modified simple self-MLD, daily skin care and inspection, lymphatic pumping the ex and appropriate compression to limit lymphedema progression and to limit further functional decline. Baseline: Dependent Goal status: INITIAL  PLAN:  PT FREQUENCY: 3x/week  PT DURATION: other: and PRN  PLANNED INTERVENTIONS: Therapeutic exercises, Therapeutic activity, Patient/Family education, Self Care, DME instructions, Manual lymph drainage, Compression bandaging, Taping, Manual therapy, and  skin care with low ph castor oil and/ or lotion matching PH of skin to limit infection and mobilize skin, scar massage  PLAN FOR NEXT SESSION:  MLD and skin care to LLE Multi layer compression wraps to LLE below the knee Pt edu for LE self care   Andrey Spearman, MS, OTR/L, CLT-LANA 01/21/22 9:18 AM

## 2022-01-22 ENCOUNTER — Encounter: Payer: Self-pay | Admitting: Occupational Therapy

## 2022-01-22 ENCOUNTER — Ambulatory Visit: Payer: Medicaid Other | Admitting: Occupational Therapy

## 2022-01-22 DIAGNOSIS — I890 Lymphedema, not elsewhere classified: Secondary | ICD-10-CM

## 2022-01-22 NOTE — Therapy (Signed)
OUTPATIENT OCCUPATIONAL THERAPY TREATMENT NOTE BILATERAL LOWER EXTREMITY LYMPHEDEMA   Patient Name: [name] MRN: [id number] DOB:[birth year], 62 y.o., female Today's Date: 01/22/2022  END OF SESSION:  OT End of Session - 01/22/22 1115     Visit Number 5   Number of Visits 36    Date for OT Re-Evaluation 03/10/22    OT Start Time 1110    OT Stop Time 1210    OT Time Calculation (min) 60 min    Activity Tolerance Patient tolerated treatment well;No increased pain    Behavior During Therapy [facility] for tasks assessed/performed               Past Medical History:  Diagnosis Date   Acute on chronic diastolic (congestive) heart failure ([place]) 06/25/2013   COPD (chronic obstructive pulmonary disease) (HCC)    Cor pulmonale (HCC)    PA Peak pressure 17mHg   Depression    Diabetes mellitus    well controlled on metformin   External hemorrhoid 01/11/2020   GERD (gastroesophageal reflux disease)    H/O mental retardation    Hyperlipidemia    Hypertension    OSA (obstructive sleep apnea)    CPAP   Pulmonary hypertension (HCC)    Restrictive lung disease    PFTs 06/2012 (FVC 54% predicted and FEV1 68% predicted w minimal bronchodilator response).   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 ulcer ([place]    chornic, ?followed up at wound care center, multiple courses of antibiotics in past for cellulitis, on lasix   Past Surgical History:  Procedure Laterality Date   CARDIAC CATHETERIZATION N/A 01/13/2016   Procedure: Right Heart Cath;  Surgeon: DJolaine Artist MD;  [facility];  Service: Cardiovascular;  Laterality: N/A;   CHOLECYSTECTOMY     COLONOSCOPY WITH PROPOFOL N/A 12/20/2019   Procedure: COLONOSCOPY WITH PROPOFOL;  Surgeon: JMilus Banister MD;  Location: [facility];  Service: Endoscopy;  Laterality: N/A;   IR FLUORO GUIDE CV LINE LEFT  11/16/2019   IR  FLUORO GUIDE CV LINE LEFT  06/17/2020   IR FLUORO GUIDE CV LINE LEFT  07/04/2020   IR FLUORO GUIDE CV LINE LEFT  07/03/2021   IR FLUORO GUIDE CV LINE LEFT  12/26/2021   IR FLUORO GUIDE CV LINE RIGHT  05/09/2019   IR FLUORO GUIDE CV LINE RIGHT  08/12/2019   IR REMOVAL TUN CV CATH W/O FL  05/05/2019   IR [place] ACCESS LEFT  11/16/2019   IR [place] VASC ACCESS LEFT  06/17/2020   IR [place] VASC ACCESS LEFT  07/04/2020   IR [place] VASC ACCESS LEFT  07/03/2021   IR [place] VASC ACCESS LEFT  12/26/2021   IR [place] VASC ACCESS RIGHT  05/09/2019   IR [place] VASC ACCESS RIGHT  08/12/2019   IR [place] VASC ACCESS RIGHT  11/16/2019   IR VENO/JUGULAR RIGHT  11/16/2019   LEFT AND RIGHT HEART CATHETERIZATION WITH CORONARY ANGIOGRAM N/A 02/28/2014   Procedure: LEFT AND RIGHT HEART CATHETERIZATION WITH CORONARY ANGIOGRAM;  Surgeon: DJolaine Artist MD;  [facility];  Service: Cardiovascular;  Laterality: N/A;   RIGHT HEART CATH N/A 06/27/2018   Procedure: RIGHT HEART CATH;  Surgeon: BJolaine Artist MD;  [facility];  Service: Cardiovascular;  Laterality: N/A;   RIGHT/LEFT HEART CATH AND CORONARY  ANGIOGRAPHY N/A 05/14/2019   Procedure: RIGHT/LEFT HEART CATH AND CORONARY ANGIOGRAPHY;  Surgeon: Jolaine Artist, MD;  Location: [facility];  Service: Cardiovascular;  Laterality: N/A;   Patient Active Problem List   Diagnosis Date Noted   Maceration of skin 10/10/2021   Chronic venous insufficiency 08/01/2021   Knee osteoarthritis 12/12/2020   Depression 09/02/2020   Left paraspinal back pain 05/05/2020   Hearing difficulty of both ears 05/05/2020   Internal hemorrhoid 01/11/2020   Fibroid, uterine 11/07/2019   Bleeding from the genitourinary system 09/13/2019   Urinary incontinence 12/12/2018   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[place])    Right ovarian cyst 05/19/2017   Rash 10/31/2014   Peripheral neuropathy ([place]) 09/27/2014    Healthcare maintenance 09/27/2014   Atrial flutter ([place]) 08/17/2012   Right heart failure, NYHA class 3 ([place]) 03/15/2012   Allergic rhinitis 11/16/2011   Diabetes ([place]) 10/15/2010   Hyperlipidemia 10/15/2010   Obstructive sleep apnea 02/21/2009   GERD 11/29/2005    PCP: Christiana Fuchs, DO  REFERRING PROVIDER: [name], MD  REFERRING DIAG: I89.0   THERAPY DIAG:  Lymphedema, not elsewhere classified    ONSET DATE: 12/08/21  SUBJECTIVE:                                                                                                                                                                                          SUBJECTIVE STATEMENT: [name] presents to OT for initial Intensive Phase CDT treatment to LLE  to address BLE phlebo-lymphedema.  Pt has no new complaints since initial evaluation. She denies LE-related leg pain. She presents with compression wraps applied last visit in place. She brings clean wraps, rolled and ready to apply, to visit. She is able to remove wraps with multiple verbal cues to remain on task.   PERTINENT HISTORY: DM, HTN, CHF, OSA, Mental illness, COPD, OA knees, Hx stasis ulcer, Atrial Flutter; obesity, stage III CKD, Pulmonary HTN  FUNCTIONAL LIMITATIONS: Gait abnormality,  impaired functional ambulation, transfers and mobility, HOH, decreased standing and walking tolerance, impaired balance, urinary incontinence, OA knee pain and decreased AROM B knees and ankles, chronic leg swelling with associated pain, difficulty fitting street shoes and LB clothing, difficulty reaching feet to bathe, inspect skin, groom nails and perform skin care, difficulty managing transportation and operating a cell phone, difficulty shopping, with meal prep and housekeeping  PAIN:  Are you having pain? No  PRECAUTIONS: Other: LYMPHEDEMA PRECAUTIONS ; Hx venous ulcer ; [place], [place]  PRIOR LEVEL OF FUNCTION: Independent with basic ADLs and Independent with  household mobility without device  PATIENT GOALS: reduce  leg swelling and limit skin breakdown   OBJECTIVE:  OBSERVATIONS /ASSESSMENTS:   LYMPHEDEMA LIFE IMPACT SCALE (LLIS):  12/24/21 initial 49.55%    FOTO functional outcomes score:  TBA   BLE COMPARATIVE LIMB VOLUMETRICS:   LANDMARK RIGHT  12/24/21 INITIAL  R LEG (A-D) (Dominant) 6449.8 ml  R THIGH (E-G) 7051.0 ml  R FULL LIMB (A-G) 13500.9 ml  Limb Volume differential (LVD)  %  Volume change since initial %  Volume change overall V  (Blank rows = not tested)  LANDMARK LEFT  12/24/21 INITIAL  R LEG (A-D) 6673.4 ml  R THIGH (E-G) 6996.2 ml  R FULL LIMB (A-G) 13669.6 ml  Limb Volume differential (LVD)  Leg LVD= 3%, L>R, thigh LVD= 7%, R>L,  Full limb LVD= 1.23%, L>R%  Volume change since initial %  Volume change overall %  (Blank rows = not tested)   TODAY'S TREATMENT:     1. Continued Pt/ CG edu for lymphedema self care home program throughout session. Topics include outcome of comparative limb volumetrics- starting limb volume differentials (LVDs), technology and gradient techniques used for short stretch, multilayer compression wrapping, simple self-MLD, therapeutic lymphatic pumping exercises, skin/nail care, LE precautions,. compression garment recommendations and specifications, wear and care schedule and compression garment donning / doffing w assistive devices. Discussed progress towards all OT goals since commencing CDT. All questions answered to the Pt's satisfaction. Good return. 2. LLE/LLQ MLD Pt bathed  leg with soap and water before reapplying wraps with set up.  3. Multilayer compression wraps to LLE below the knee as established  PATIENT EDUCATION:  Continued Intro level simple self MLD, bandaging edu, lymphedema self-care and home program, oraginzing / preparing for sessions, and made a cell phone video for Pt's son in hopes that he can assist her with compression wraps between visits. Person educated:  Patient Education method: Explanation, Demonstration, Tactile cues, Verbal cues, and Handouts Education comprehension: verbalized understanding, returned demonstration, verbal cues required, and needs further education  LYMPHEDEMA SELF-CARE HOME PROGRAM:(Intensive and self-management phases of CDT) 1.Simple Self-MLD to BLE/BLQ daily 2. Compression: short stretch multi layer wraps on one leg at a time only during Intensive Phase CDT; Fit with appropriate compression garments during self-management phase. 3. Daily skin care 4. Lymphatic pumping there ex- 1 x daily, 10 reps of each, in order 5. Flexitouch advanced sequential pneumatic compression device, or "pump"  ASSESSMENT:  CLINICAL IMPRESSION: Pt came well prepared to OT session. The LLE below the knee has undergone a dramatic volume reduction in a very few sessions. Pt tolerated MLS and reapplied wraps. Cont as per POC. Consider measuring for LLE adjustable compression garments next week if reduction slows..   OBJECTIVE IMPAIRMENTS: Abnormal gait, decreased balance, decreased cognition, decreased knowledge of condition, decreased knowledge of use of DME, decreased mobility, decreased ROM, increased edema, impaired flexibility, impaired sensation, obesity, and pain.   ACTIVITY LIMITATIONS: sitting, standing, squatting, stairs, transfers, bed mobility, continence, bathing, toileting, and dressing  PARTICIPATION LIMITATIONS: meal prep, cleaning, laundry, driving, shopping, community activity, yard work, and Social participation limited due to increased lag pain and swelling with standing , walking, and or dependent sitting > 15 min.  PERSONAL FACTORS: Age, Behavior pattern, Time since onset of injury/illness/exacerbation, Transportation, 3+ comorbidities:  , and hx of poor attendenace and non compliance with previous  CDT  are also affecting patient's functional outcome.   REHAB POTENTIAL: Good  EVALUATION COMPLEXITY:  Moderate   GOALS: Goals reviewed with patient? Yes  SHORT TERM GOALS: Target  date: 4th OT Rx visit   Pt will demonstrate understanding of lymphedema precautions and prevention strategies with modified independence using a printed reference to identify at least 5 precautions and discussing how s/he may implement them into daily life to reduce risk of progression with min A. Baseline: Max A Goal status: INITIAL  2.   With Max caregiver assistance  x 1 Pt will be able to apply multilayer, knee length, compression wraps using gradient techniques to decrease limb volume, to limit infection risk, and to limit lymphedema progression.  Baseline: Dependent Goal status: INITIAL  LONG TERM GOALS: Target date: 03/08/22  Given this patient's Intake score of 44.12% on the Lymphedema Life Impact Scale (LLIS), patient will experience a reduction of at least 5% in her perceived level of functional impairment resulting from lymphedema to improve functional performance and quality of life (QOL). Baseline: 44.12% Goal status: INITIAL  2.  Given this patient's Intake score of TBA/100% on the functional outcomes FOTO tool, patient will experience an increase in function of 5 points to improve basic and instrumental ADLs performance, including lymphedema self-care. Baseline:  (TBA at first OT Rx visit) Goal status: INITIAL  3.   With Max caregiver assistance  Pt will be able to apply multilayer, knee length, compression wraps using gradient techniques to decrease limb volume, to limit infection risk, and to limit lymphedema progression.  Baseline: Dependent Goal status: INITIAL  4.  With modified assistance ( assistive devices and extra time PRN) Pt will be able to don and doff appropriate compression garments and/or devices to control BLE lymphedema and to limit progression.  Baseline: Dependent Goal status: INITIAL  5.  Pt will achieve at least a 10% volume reductions bilaterally below the knees to return  limb to more typical size and shape, to limit infection risk and LE progression, to decrease pain, to improve function, and to improve body image and QOL. Baseline: Dependent Goal status: INITIAL  6.   With Max caregiver assistance Pt will achieve and sustain no less than 85% compliance with all LE self-care home program components throughout CDT, including modified simple self-MLD, daily skin care and inspection, lymphatic pumping the ex and appropriate compression to limit lymphedema progression and to limit further functional decline. Baseline: Dependent Goal status: INITIAL   PLAN:  PT FREQUENCY: 3x/week  PT DURATION: other: and PRN  PLANNED INTERVENTIONS: Therapeutic exercises, Therapeutic activity, Patient/Family education, Self Care, DME instructions, Manual lymph drainage, Compression bandaging, Taping, Manual therapy, and skin care with low ph castor oil and/ or lotion matching PH of skin to limit infection and mobilize skin, scar massage  PLAN FOR NEXT SESSION:  MLD and skin care to LLE Multi layer compression wraps to LLE below the knee Pt edu for LE self care   Andrey Spearman, MS, OTR/L, CLT-LANA 01/22/22 3:21 PM

## 2022-01-26 ENCOUNTER — Ambulatory Visit: Payer: Medicaid Other | Admitting: Occupational Therapy

## 2022-01-27 ENCOUNTER — Other Ambulatory Visit: Payer: Medicaid Other | Admitting: *Deleted

## 2022-01-27 NOTE — Patient Instructions (Signed)
Visit Information  [name]  - as a part of your Medicaid benefit, you are eligible for care management and care coordination services at no cost or copay. I was unable to reach you by phone today but would be happy to help you with your health related needs. Please feel free to call me @ [phone].   A member of the Managed Medicaid care management team will reach out to you again over the next 14 days.   [name] RN, BSN [place]  [employer] manager

## 2022-01-27 NOTE — Patient Outreach (Signed)
  Medicaid Managed Care   Unsuccessful Attempt Note   01/27/2022 Name: [name] MRN: [id number] DOB: 1960-07-09  Referred by: Masters, [name], DO Reason for referral : High Risk Managed Medicaid (Unsuccessful RNCM follow up telephone outreach)   An unsuccessful telephone outreach was attempted today. The patient was referred to the case management team for assistance with care management and care coordination.    Follow Up Plan: The Managed Medicaid care management team will reach out to the patient again over the next 14 days.    [name] RN, BSN [place]  [employer] manager

## 2022-01-28 ENCOUNTER — Ambulatory Visit: Payer: Medicaid Other | Admitting: Occupational Therapy

## 2022-01-29 ENCOUNTER — Encounter: Payer: Medicaid Other | Admitting: Occupational Therapy

## 2022-01-31 ENCOUNTER — Emergency Department (HOSPITAL_COMMUNITY): Payer: Medicaid Other

## 2022-01-31 ENCOUNTER — Other Ambulatory Visit: Payer: Self-pay

## 2022-01-31 ENCOUNTER — Encounter (HOSPITAL_COMMUNITY): Payer: Self-pay

## 2022-01-31 DIAGNOSIS — I2609 Other pulmonary embolism with acute cor pulmonale: Secondary | ICD-10-CM

## 2022-01-31 DIAGNOSIS — I959 Hypotension, unspecified: Secondary | ICD-10-CM | POA: Diagnosis not present

## 2022-01-31 DIAGNOSIS — Z515 Encounter for palliative care: Secondary | ICD-10-CM

## 2022-01-31 DIAGNOSIS — N1832 Chronic kidney disease, stage 3b: Secondary | ICD-10-CM | POA: Diagnosis present

## 2022-01-31 DIAGNOSIS — I272 Pulmonary hypertension, unspecified: Secondary | ICD-10-CM | POA: Diagnosis not present

## 2022-01-31 DIAGNOSIS — J189 Pneumonia, unspecified organism: Secondary | ICD-10-CM | POA: Diagnosis not present

## 2022-01-31 DIAGNOSIS — R197 Diarrhea, unspecified: Secondary | ICD-10-CM | POA: Diagnosis not present

## 2022-01-31 DIAGNOSIS — J9601 Acute respiratory failure with hypoxia: Secondary | ICD-10-CM | POA: Diagnosis not present

## 2022-01-31 DIAGNOSIS — E662 Morbid (severe) obesity with alveolar hypoventilation: Secondary | ICD-10-CM | POA: Diagnosis present

## 2022-01-31 DIAGNOSIS — R54 Age-related physical debility: Secondary | ICD-10-CM | POA: Diagnosis present

## 2022-01-31 DIAGNOSIS — I5032 Chronic diastolic (congestive) heart failure: Secondary | ICD-10-CM | POA: Diagnosis not present

## 2022-01-31 DIAGNOSIS — E1165 Type 2 diabetes mellitus with hyperglycemia: Secondary | ICD-10-CM | POA: Diagnosis present

## 2022-01-31 DIAGNOSIS — F32A Depression, unspecified: Secondary | ICD-10-CM | POA: Diagnosis present

## 2022-01-31 DIAGNOSIS — T80211A Bloodstream infection due to central venous catheter, initial encounter: Secondary | ICD-10-CM | POA: Diagnosis not present

## 2022-01-31 DIAGNOSIS — I270 Primary pulmonary hypertension: Secondary | ICD-10-CM

## 2022-01-31 DIAGNOSIS — I872 Venous insufficiency (chronic) (peripheral): Secondary | ICD-10-CM | POA: Diagnosis present

## 2022-01-31 DIAGNOSIS — B9561 Methicillin susceptible Staphylococcus aureus infection as the cause of diseases classified elsewhere: Secondary | ICD-10-CM | POA: Diagnosis present

## 2022-01-31 DIAGNOSIS — I21A1 Myocardial infarction type 2: Secondary | ICD-10-CM | POA: Diagnosis present

## 2022-01-31 DIAGNOSIS — J9621 Acute and chronic respiratory failure with hypoxia: Secondary | ICD-10-CM | POA: Diagnosis present

## 2022-01-31 DIAGNOSIS — I50813 Acute on chronic right heart failure: Secondary | ICD-10-CM | POA: Diagnosis present

## 2022-01-31 DIAGNOSIS — Z83438 Family history of other disorder of lipoprotein metabolism and other lipidemia: Secondary | ICD-10-CM

## 2022-01-31 DIAGNOSIS — Z7189 Other specified counseling: Secondary | ICD-10-CM

## 2022-01-31 DIAGNOSIS — R7989 Other specified abnormal findings of blood chemistry: Secondary | ICD-10-CM | POA: Diagnosis not present

## 2022-01-31 DIAGNOSIS — Z885 Allergy status to narcotic agent status: Secondary | ICD-10-CM

## 2022-01-31 DIAGNOSIS — T80211D Bloodstream infection due to central venous catheter, subsequent encounter: Secondary | ICD-10-CM | POA: Diagnosis not present

## 2022-01-31 DIAGNOSIS — H109 Unspecified conjunctivitis: Secondary | ICD-10-CM | POA: Diagnosis not present

## 2022-01-31 DIAGNOSIS — I5031 Acute diastolic (congestive) heart failure: Secondary | ICD-10-CM | POA: Diagnosis not present

## 2022-01-31 DIAGNOSIS — E1122 Type 2 diabetes mellitus with diabetic chronic kidney disease: Secondary | ICD-10-CM | POA: Diagnosis present

## 2022-01-31 DIAGNOSIS — R627 Adult failure to thrive: Secondary | ICD-10-CM | POA: Diagnosis present

## 2022-01-31 DIAGNOSIS — Z1152 Encounter for screening for COVID-19: Secondary | ICD-10-CM | POA: Diagnosis not present

## 2022-01-31 DIAGNOSIS — F70 Mild intellectual disabilities: Secondary | ICD-10-CM | POA: Diagnosis present

## 2022-01-31 DIAGNOSIS — Z66 Do not resuscitate: Secondary | ICD-10-CM | POA: Diagnosis present

## 2022-01-31 DIAGNOSIS — Y848 Other medical procedures as the cause of abnormal reaction of the patient, or of later complication, without mention of misadventure at the time of the procedure: Secondary | ICD-10-CM | POA: Diagnosis present

## 2022-01-31 DIAGNOSIS — Z803 Family history of malignant neoplasm of breast: Secondary | ICD-10-CM

## 2022-01-31 DIAGNOSIS — I2729 Other secondary pulmonary hypertension: Secondary | ICD-10-CM | POA: Diagnosis not present

## 2022-01-31 DIAGNOSIS — I2722 Pulmonary hypertension due to left heart disease: Secondary | ICD-10-CM | POA: Diagnosis present

## 2022-01-31 DIAGNOSIS — E876 Hypokalemia: Secondary | ICD-10-CM

## 2022-01-31 DIAGNOSIS — J440 Chronic obstructive pulmonary disease with acute lower respiratory infection: Secondary | ICD-10-CM | POA: Diagnosis present

## 2022-01-31 DIAGNOSIS — I2721 Secondary pulmonary arterial hypertension: Secondary | ICD-10-CM | POA: Diagnosis present

## 2022-01-31 DIAGNOSIS — I130 Hypertensive heart and chronic kidney disease with heart failure and stage 1 through stage 4 chronic kidney disease, or unspecified chronic kidney disease: Secondary | ICD-10-CM | POA: Diagnosis present

## 2022-01-31 DIAGNOSIS — R188 Other ascites: Secondary | ICD-10-CM | POA: Diagnosis present

## 2022-01-31 DIAGNOSIS — I5033 Acute on chronic diastolic (congestive) heart failure: Secondary | ICD-10-CM | POA: Diagnosis present

## 2022-01-31 DIAGNOSIS — R0602 Shortness of breath: Secondary | ICD-10-CM | POA: Diagnosis not present

## 2022-01-31 DIAGNOSIS — Z810 Family history of intellectual disabilities: Secondary | ICD-10-CM

## 2022-01-31 DIAGNOSIS — I3139 Other pericardial effusion (noninflammatory): Secondary | ICD-10-CM | POA: Diagnosis present

## 2022-01-31 DIAGNOSIS — Z79899 Other long term (current) drug therapy: Secondary | ICD-10-CM

## 2022-01-31 DIAGNOSIS — E785 Hyperlipidemia, unspecified: Secondary | ICD-10-CM | POA: Diagnosis present

## 2022-01-31 DIAGNOSIS — J9622 Acute and chronic respiratory failure with hypercapnia: Secondary | ICD-10-CM | POA: Diagnosis present

## 2022-01-31 DIAGNOSIS — E871 Hypo-osmolality and hyponatremia: Secondary | ICD-10-CM | POA: Diagnosis not present

## 2022-01-31 DIAGNOSIS — R7881 Bacteremia: Secondary | ICD-10-CM | POA: Diagnosis not present

## 2022-01-31 DIAGNOSIS — Y95 Nosocomial condition: Secondary | ICD-10-CM | POA: Diagnosis present

## 2022-01-31 DIAGNOSIS — Z95828 Presence of other vascular implants and grafts: Secondary | ICD-10-CM

## 2022-01-31 DIAGNOSIS — R0902 Hypoxemia: Secondary | ICD-10-CM | POA: Diagnosis not present

## 2022-01-31 DIAGNOSIS — I890 Lymphedema, not elsewhere classified: Secondary | ICD-10-CM | POA: Diagnosis present

## 2022-01-31 DIAGNOSIS — T50916A Underdosing of multiple unspecified drugs, medicaments and biological substances, initial encounter: Secondary | ICD-10-CM | POA: Diagnosis present

## 2022-01-31 DIAGNOSIS — F79 Unspecified intellectual disabilities: Secondary | ICD-10-CM | POA: Diagnosis present

## 2022-01-31 DIAGNOSIS — I2723 Pulmonary hypertension due to lung diseases and hypoxia: Secondary | ICD-10-CM | POA: Diagnosis present

## 2022-01-31 DIAGNOSIS — I5082 Biventricular heart failure: Secondary | ICD-10-CM | POA: Diagnosis present

## 2022-01-31 DIAGNOSIS — M7989 Other specified soft tissue disorders: Secondary | ICD-10-CM | POA: Diagnosis not present

## 2022-01-31 DIAGNOSIS — Z886 Allergy status to analgesic agent status: Secondary | ICD-10-CM

## 2022-01-31 DIAGNOSIS — Z9049 Acquired absence of other specified parts of digestive tract: Secondary | ICD-10-CM

## 2022-01-31 DIAGNOSIS — I38 Endocarditis, valve unspecified: Secondary | ICD-10-CM | POA: Diagnosis present

## 2022-01-31 DIAGNOSIS — Z91148 Patient's other noncompliance with medication regimen for other reason: Secondary | ICD-10-CM

## 2022-01-31 DIAGNOSIS — T80219A Unspecified infection due to central venous catheter, initial encounter: Secondary | ICD-10-CM | POA: Diagnosis not present

## 2022-01-31 DIAGNOSIS — Z6841 Body Mass Index (BMI) 40.0 and over, adult: Secondary | ICD-10-CM

## 2022-01-31 DIAGNOSIS — I4891 Unspecified atrial fibrillation: Secondary | ICD-10-CM | POA: Diagnosis not present

## 2022-01-31 DIAGNOSIS — Z91040 Latex allergy status: Secondary | ICD-10-CM

## 2022-01-31 DIAGNOSIS — Z882 Allergy status to sulfonamides status: Secondary | ICD-10-CM

## 2022-01-31 DIAGNOSIS — Z888 Allergy status to other drugs, medicaments and biological substances status: Secondary | ICD-10-CM

## 2022-01-31 DIAGNOSIS — I480 Paroxysmal atrial fibrillation: Secondary | ICD-10-CM | POA: Diagnosis present

## 2022-01-31 DIAGNOSIS — Z91138 Patient's unintentional underdosing of medication regimen for other reason: Secondary | ICD-10-CM

## 2022-01-31 DIAGNOSIS — Z7901 Long term (current) use of anticoagulants: Secondary | ICD-10-CM

## 2022-01-31 DIAGNOSIS — K219 Gastro-esophageal reflux disease without esophagitis: Secondary | ICD-10-CM | POA: Diagnosis present

## 2022-01-31 DIAGNOSIS — D696 Thrombocytopenia, unspecified: Secondary | ICD-10-CM | POA: Diagnosis not present

## 2022-01-31 DIAGNOSIS — Z7984 Long term (current) use of oral hypoglycemic drugs: Secondary | ICD-10-CM

## 2022-01-31 DIAGNOSIS — N179 Acute kidney failure, unspecified: Secondary | ICD-10-CM | POA: Diagnosis present

## 2022-01-31 DIAGNOSIS — J9620 Acute and chronic respiratory failure, unspecified whether with hypoxia or hypercapnia: Secondary | ICD-10-CM | POA: Diagnosis not present

## 2022-01-31 DIAGNOSIS — Z9981 Dependence on supplemental oxygen: Secondary | ICD-10-CM

## 2022-01-31 DIAGNOSIS — I2781 Cor pulmonale (chronic): Secondary | ICD-10-CM | POA: Diagnosis present

## 2022-01-31 DIAGNOSIS — Z818 Family history of other mental and behavioral disorders: Secondary | ICD-10-CM

## 2022-01-31 DIAGNOSIS — I878 Other specified disorders of veins: Secondary | ICD-10-CM | POA: Diagnosis present

## 2022-01-31 DIAGNOSIS — Z452 Encounter for adjustment and management of vascular access device: Secondary | ICD-10-CM | POA: Diagnosis not present

## 2022-01-31 LAB — I-STAT VENOUS BLOOD GAS, ED
Acid-Base Excess: 4 mmol/L — ABNORMAL HIGH (ref 0.0–2.0)
Acid-Base Excess: 5 mmol/L — ABNORMAL HIGH (ref 0.0–2.0)
Bicarbonate: 28.5 mmol/L — ABNORMAL HIGH (ref 20.0–28.0)
Bicarbonate: 29.2 mmol/L — ABNORMAL HIGH (ref 20.0–28.0)
Calcium, Ion: 1.12 mmol/L — ABNORMAL LOW (ref 1.15–1.40)
Calcium, Ion: 1.09 mmol/L — ABNORMAL LOW (ref 1.15–1.40)
HCT: 45 % (ref 36.0–46.0)
HCT: 44 % (ref 36.0–46.0)
Hemoglobin: 15.3 g/dL — ABNORMAL HIGH (ref 12.0–15.0)
Hemoglobin: 15 g/dL (ref 12.0–15.0)
O2 Saturation: 87 %
O2 Saturation: 78 %
Potassium: 2.5 mmol/L — CL (ref 3.5–5.1)
Potassium: 2.5 mmol/L — CL (ref 3.5–5.1)
Sodium: 129 mmol/L — ABNORMAL LOW (ref 135–145)
Sodium: 131 mmol/L — ABNORMAL LOW (ref 135–145)
TCO2: 30 mmol/L (ref 22–32)
TCO2: 30 mmol/L (ref 22–32)
pCO2, Ven: 39.5 mmHg — ABNORMAL LOW (ref 44–60)
pCO2, Ven: 40.8 mmHg — ABNORMAL LOW (ref 44–60)
pH, Ven: 7.466 — ABNORMAL HIGH (ref 7.25–7.43)
pH, Ven: 7.463 — ABNORMAL HIGH (ref 7.25–7.43)
pO2, Ven: 50 mmHg — ABNORMAL HIGH (ref 32–45)
pO2, Ven: 40 mmHg (ref 32–45)

## 2022-01-31 LAB — COMPREHENSIVE METABOLIC PANEL
ALT: 18 U/L (ref 0–44)
AST: 34 U/L (ref 15–41)
Albumin: 2.8 g/dL — ABNORMAL LOW (ref 3.5–5.0)
Alkaline Phosphatase: 67 U/L (ref 38–126)
Anion gap: 16 — ABNORMAL HIGH (ref 5–15)
BUN: 36 mg/dL — ABNORMAL HIGH (ref 6–20)
CO2: 25 mmol/L (ref 22–32)
Calcium: 8.6 mg/dL — ABNORMAL LOW (ref 8.9–10.3)
Chloride: 87 mmol/L — ABNORMAL LOW (ref 98–111)
Creatinine, Ser: 1.03 mg/dL — ABNORMAL HIGH (ref 0.44–1.00)
GFR, Estimated: >60 mL/min (ref >60)
Glucose, Bld: 144 mg/dL — ABNORMAL HIGH (ref 70–99)
Potassium: 2.5 mmol/L — CL (ref 3.5–5.1)
Sodium: 128 mmol/L — ABNORMAL LOW (ref 135–145)
Total Bilirubin: 1.3 mg/dL — ABNORMAL HIGH (ref 0.3–1.2)
Total Protein: 6.3 g/dL — ABNORMAL LOW (ref 6.5–8.1)

## 2022-01-31 LAB — CBC WITH DIFFERENTIAL/PLATELET
Abs Immature Granulocytes: 0 10*3/uL (ref 0.00–0.07)
Basophils Absolute: 0 10*3/uL (ref 0.0–0.1)
Basophils Relative: 0 %
Eosinophils Absolute: 0 10*3/uL (ref 0.0–0.5)
Eosinophils Relative: 0 %
HCT: 41.3 % (ref 36.0–46.0)
Hemoglobin: 14.1 g/dL (ref 12.0–15.0)
Lymphocytes Relative: 5 %
Lymphs Abs: 1.3 10*3/uL (ref 0.7–4.0)
MCH: 28.7 pg (ref 26.0–34.0)
MCHC: 34.1 g/dL (ref 30.0–36.0)
MCV: 84.1 fL (ref 80.0–100.0)
Monocytes Absolute: 1.5 10*3/uL — ABNORMAL HIGH (ref 0.1–1.0)
Monocytes Relative: 6 %
Neutro Abs: 22.6 10*3/uL — ABNORMAL HIGH (ref 1.7–7.7)
Neutrophils Relative %: 89 %
Platelets: 186 10*3/uL (ref 150–400)
RBC: 4.91 MIL/uL (ref 3.87–5.11)
RDW: 15.8 % — ABNORMAL HIGH (ref 11.5–15.5)
WBC: 25.4 10*3/uL — ABNORMAL HIGH (ref 4.0–10.5)
nRBC: 0 /100 WBC
nRBC: 0.2 % (ref 0.0–0.2)

## 2022-01-31 LAB — BRAIN NATRIURETIC PEPTIDE: B Natriuretic Peptide: 449.8 pg/mL — ABNORMAL HIGH (ref 0.0–100.0)

## 2022-01-31 LAB — RESP PANEL BY RT-PCR (RSV, FLU A&B, COVID)  RVPGX2
Influenza A by PCR: NEGATIVE
Influenza B by PCR: NEGATIVE
Resp Syncytial Virus by PCR: NEGATIVE
SARS Coronavirus 2 by RT PCR: NEGATIVE

## 2022-01-31 LAB — TROPONIN I (HIGH SENSITIVITY)
Troponin I (High Sensitivity): 371 ng/L (ref <18)
Troponin I (High Sensitivity): 373 ng/L (ref <18)

## 2022-01-31 LAB — MAGNESIUM: Magnesium: 1.8 mg/dL (ref 1.7–2.4)

## 2022-01-31 MED ORDER — SODIUM CHLORIDE 0.9 % IV SOLN
500.0000 mg | Freq: Once | INTRAVENOUS | Status: AC
  Administered 2022-01-31: 500 mg via INTRAVENOUS
  Filled 2022-01-31: qty 5

## 2022-01-31 MED ORDER — ACETAMINOPHEN 325 MG PO TABS
650.0000 mg | ORAL_TABLET | ORAL | Status: DC | PRN
  Administered 2022-02-01 – 2022-02-07 (×7): 650 mg via ORAL
  Filled 2022-01-31 (×8): qty 2

## 2022-01-31 MED ORDER — SODIUM CHLORIDE 0.9 % IV SOLN
1.0000 g | Freq: Once | INTRAVENOUS | Status: AC
  Administered 2022-01-31: 1 g via INTRAVENOUS
  Filled 2022-01-31: qty 10

## 2022-01-31 MED ORDER — IPRATROPIUM-ALBUTEROL 0.5-2.5 (3) MG/3ML IN SOLN: 3.0000 mL | RESPIRATORY_TRACT | Status: DC | PRN

## 2022-01-31 MED ORDER — PRAVASTATIN SODIUM 40 MG PO TABS
20.0000 mg | ORAL_TABLET | Freq: Every day | ORAL | Status: DC
  Administered 2022-02-01 – 2022-02-08 (×7): 20 mg via ORAL
  Filled 2022-01-31 (×8): qty 1

## 2022-01-31 MED ORDER — MAGNESIUM SULFATE 2 GM/50ML IV SOLN
2.0000 g | Freq: Once | INTRAVENOUS | Status: AC
  Administered 2022-02-01: 2 g via INTRAVENOUS
  Filled 2022-01-31: qty 50

## 2022-01-31 MED ORDER — PANTOPRAZOLE SODIUM 20 MG PO TBEC: 20.0000 mg | DELAYED_RELEASE_TABLET | Freq: Two times a day (BID) | ORAL | Status: DC

## 2022-01-31 MED ORDER — POTASSIUM CHLORIDE CRYS ER 20 MEQ PO TBCR: 40.0000 meq | EXTENDED_RELEASE_TABLET | Freq: Once | ORAL | Status: DC

## 2022-01-31 MED ORDER — POTASSIUM CHLORIDE 10 MEQ/100ML IV SOLN: 10.0000 meq | INTRAVENOUS | Status: DC

## 2022-01-31 MED ORDER — POTASSIUM CHLORIDE 10 MEQ/100ML IV SOLN
10.0000 meq | INTRAVENOUS | Status: AC
  Administered 2022-02-01 (×6): 10 meq via INTRAVENOUS
  Filled 2022-01-31 (×5): qty 100

## 2022-01-31 MED ORDER — SILDENAFIL CITRATE 20 MG PO TABS
20.0000 mg | ORAL_TABLET | Freq: Three times a day (TID) | ORAL | Status: DC
  Administered 2022-02-01 – 2022-02-08 (×23): 20 mg via ORAL
  Filled 2022-01-31 (×24): qty 1

## 2022-01-31 MED ORDER — SILDENAFIL CITRATE 20 MG PO TABS: 20.0000 mg | ORAL_TABLET | Freq: Three times a day (TID) | ORAL | Status: DC

## 2022-01-31 MED ORDER — POLYETHYLENE GLYCOL 3350 17 G PO PACK
17.0000 g | PACK | Freq: Every day | ORAL | Status: DC | PRN
  Administered 2022-02-09: 17 g via ORAL
  Filled 2022-01-31: qty 1

## 2022-01-31 MED ORDER — INSULIN ASPART 100 UNIT/ML IJ SOLN
0.0000 [IU] | INTRAMUSCULAR | Status: DC
  Administered 2022-02-01 (×3): 2 [IU] via SUBCUTANEOUS
  Administered 2022-02-01: 1 [IU] via SUBCUTANEOUS
  Administered 2022-02-01: 3 [IU] via SUBCUTANEOUS
  Administered 2022-02-01 (×2): 2 [IU] via SUBCUTANEOUS
  Administered 2022-02-02: 1 [IU] via SUBCUTANEOUS
  Administered 2022-02-02: 2 [IU] via SUBCUTANEOUS
  Administered 2022-02-02 (×2): 1 [IU] via SUBCUTANEOUS
  Administered 2022-02-02: 2 [IU] via SUBCUTANEOUS
  Administered 2022-02-03 (×2): 1 [IU] via SUBCUTANEOUS
  Administered 2022-02-03 (×2): 2 [IU] via SUBCUTANEOUS
  Administered 2022-02-03: 1 [IU] via SUBCUTANEOUS
  Administered 2022-02-04 – 2022-02-05 (×5): 2 [IU] via SUBCUTANEOUS
  Administered 2022-02-05 (×2): 1 [IU] via SUBCUTANEOUS
  Administered 2022-02-05: 2 [IU] via SUBCUTANEOUS
  Administered 2022-02-05 – 2022-02-06 (×3): 1 [IU] via SUBCUTANEOUS
  Administered 2022-02-06: 3 [IU] via SUBCUTANEOUS
  Administered 2022-02-06: 2 [IU] via SUBCUTANEOUS
  Administered 2022-02-07: 1 [IU] via SUBCUTANEOUS
  Administered 2022-02-07: 2 [IU] via SUBCUTANEOUS
  Administered 2022-02-07: 3 [IU] via SUBCUTANEOUS
  Administered 2022-02-07: 1 [IU] via SUBCUTANEOUS
  Administered 2022-02-08: 2 [IU] via SUBCUTANEOUS

## 2022-01-31 MED ORDER — FUROSEMIDE 10 MG/ML IJ SOLN
80.0000 mg | Freq: Once | INTRAMUSCULAR | Status: AC
  Administered 2022-01-31: 80 mg via INTRAVENOUS
  Filled 2022-01-31: qty 8

## 2022-01-31 MED ORDER — DOCUSATE SODIUM 100 MG PO CAPS
100.0000 mg | ORAL_CAPSULE | Freq: Two times a day (BID) | ORAL | Status: DC | PRN
  Administered 2022-02-09: 100 mg via ORAL
  Filled 2022-01-31: qty 1

## 2022-01-31 NOTE — ED Triage Notes (Addendum)
Pt had SOB for last 3 days but worsened today - Pt was 72% on RA.  *5% on NRB and 91 % on CPAP.  Pt has hx of COPD and CHF.  Pt was give 0.5 duoneb and 125 Solu-medrol  pt on CPAP upon arrival

## 2022-01-31 NOTE — H&P (Incomplete)
NAME:  [name], MRN:  [id number], DOB:  07-28-1960, LOS: 0 ADMISSION DATE:  01/06/2022, CONSULTATION DATE:  1/28 REFERRING MD:  Dr. Regenia Skeeter, CHIEF COMPLAINT:  Acute hypoxic resp failure; chf vs. Copd exacerbation   History of Present Illness:  Patient is a 62 year old female with pertinent PMH chronic diastolic HF, cor pulmonale, severe pulmonary hypertension (on Remodulin and sildenafil), A-fib on Xarelto, OSA on CPAP presents to [facility] ED on 5/28 with SOB.  Patient states her breathing has worsened over the past few days.  States she is not very compliant with her medications at home.  On 1/28 her breathing was much worse and called EMS.  EMS found patient with sats 72% on room air and initially placed on O2 but required CPAP.  Upon arrival to [facility] ED on 1/28, patient in acute respiratory distress on BiPAP.    Pertinent  Medical History   Past Medical History:  Diagnosis Date  . Acute on chronic diastolic (congestive) heart failure ([place]) 06/25/2013  . COPD (chronic obstructive pulmonary disease) ([place])   . Cor pulmonale (HCC)    PA Peak pressure 23mHg  . Depression   . Diabetes mellitus    well controlled on metformin  . External hemorrhoid 01/11/2020  . GERD (gastroesophageal reflux disease)   . H/O mental retardation   . Hyperlipidemia   . Hypertension   . OSA (obstructive sleep apnea)    CPAP  . Pulmonary hypertension ([place]   . Restrictive lung disease    PFTs 06/2012 (FVC 54% predicted and FEV1 68% predicted w minimal bronchodilator response).  . Stage 3b chronic kidney disease (CKD) ([place]   . Swelling of lower extremity 08/13/2020  . Vagina bleeding 10/15/2019  . Venous stasis ulcer ([place]    chornic, ?followed up at wound care center, multiple courses of antibiotics in past for cellulitis, on lasix     Significant Hospital Events: Including procedures, antibiotic start and stop dates in addition to other pertinent events   1/28 admitted likely chf exacerbation  on bipap  Interim History / Subjective:  See above  Objective   Blood pressure 104/68, pulse 94, temperature 98.7 F (37.1 C), temperature source Axillary, resp. rate (!) 28, height '5\' 3"'$  (1.6 m), weight 117.9 kg, last menstrual period 02/19/2011, SpO2 90 %.    FiO2 (%):  [50 %] 50 %   Intake/Output Summary (Last 24 hours) at 01/04/2022 2259 Last data filed at 01/18/2022 2128 Gross per 24 hour  Intake 347.62 ml  Output --  Net 347.62 ml   Filed Weights   01/06/2022 1928  Weight: 117.9 kg    Examination: General:   ill appearing on bipap w/ resp distress HEENT: MM pink/moist; bipap in place Neuro: Aox3; MAE CV: s1s2, afib rate 70s, no m/r/g PULM:  dim BS bilaterally; on bipap 80% sats 91%; unable to speak without getting sob GI: abdominal distention, soft, bsx4 active  Extremities: warm/dry, BLE edema chronic venous insufficiency; UNNA boot on left Skin: no rashes or lesions appreciated   Resolved Hospital Problem list     Assessment & Plan:  Acute respiratory failure w/ hypoxia Likely CHF exacerbation: patient states non compliant w/ medications Possible CAP OSA on cpap COPD?: not on any inhalers at home Plan: -cont bipap -lasix given; monitor UOP -rocephin/azithro for cap ppx -check rvp, urine leg/strept -prn duoneb -patient unable to lay flat for CTA chest to rule out PE; will check dvt ultrasound  Acute on chronic diastolic chf/cor pulmonale Pulmonary HTN -follows  w/ [facility] Plan: -HF team consutled; appreciate recs -IV lasix given; consider -consider resuming home spirinolactone and torsemide -resume home remodulin and sildenafil  -daily  Possible sepsis -likely pneumonia Plan: -trend LA -abx as above for cap ppx -f/u cultures, ua -check pct -trend wbc/fever curve  Abdominal distention Plan: -likely ascites from hypervolemia -IV lasix given -consider resuming home spironolactone -will likely need paracentesis -KUB -consider CT  abd/pelvis when able to lay flat  Elevated trop Plan: -likely demand ischemia -trend trop -heparin for afib below  Afib on xarelto Plan: -tele -heparin gtt -rate controlled  Hypokalemia Hyponatremia: likely hypervolemia Elevated creat Plan: -giving K -Lasix given -Trend BMP / urinary output -Replace electrolytes as indicated -Avoid nephrotoxic agents, ensure adequate renal perfusion  Chronic venous insufficiency Plan: -dvt ultrasound -Unna boot  HLD Plan: -resume statin  DMT2 Plan: -ssi and cbg monitoring -A1c  GERD Plan: -PPI  Best Practice (right click and "Reselect all SmartList Selections" daily)   Diet/type: NPO w/ oral meds DVT prophylaxis: systemic heparin GI prophylaxis: PPI Lines: N/A Foley:  N/A Code Status:  full code Last date of multidisciplinary goals of care discussion [1/28 updated patient at bedside. Attempted to update son Legrand Como over phone but no answer]  Labs   CBC: Recent Labs  Lab 01/23/2022 1940 02/03/2022 1950 01/07/2022 2140  WBC 25.4*  --   --   NEUTROABS 22.6*  --   --   HGB 14.1 15.3* 15.0  HCT 41.3 45.0 44.0  MCV 84.1  --   --   PLT 186  --   --     Basic Metabolic Panel: Recent Labs  Lab 01/14/2022 1950 01/16/2022 2135 01/14/2022 2140  NA 129*  --  131*  K 2.5*  --  2.5*  MG  --  1.8  --    GFR: CrCl cannot be calculated (Patient's most recent lab result is older than the maximum 21 days allowed.). Recent Labs  Lab 01/12/2022 1940  WBC 25.4*    Liver Function Tests: No results for input(s): "AST", "ALT", "ALKPHOS", "BILITOT", "PROT", "ALBUMIN" in the last 168 hours. No results for input(s): "LIPASE", "AMYLASE" in the last 168 hours. No results for input(s): "AMMONIA" in the last 168 hours.  ABG    Component Value Date/Time   PHART 7.358 05/14/2019 1340   PCO2ART 33.3 05/14/2019 1340   PO2ART 83 05/14/2019 1340   HCO3 29.2 (H) 01/26/2022 2140   TCO2 30 01/14/2022 2140   ACIDBASEDEF 6.0 (H) 05/14/2019  1340   O2SAT 78 01/04/2022 2140     Coagulation Profile: No results for input(s): "INR", "PROTIME" in the last 168 hours.  Cardiac Enzymes: No results for input(s): "CKTOTAL", "CKMB", "CKMBINDEX", "TROPONINI" in the last 168 hours.  HbA1C: Hemoglobin A1C  Date/Time Value Ref Range Status  09/03/2021 03:46 PM 5.7 (A) 4.0 - 5.6 % Final  05/05/2020 09:24 AM 5.6 4.0 - 5.6 % Final   Hgb A1c MFr Bld  Date/Time Value Ref Range Status  10/14/2010 10:41 AM 13.5 (H) <5.7 % Final    Comment:  According to the ADA Clinical Practice Recommendations for 2011, when HbA1c is used as a screening test:     >=6.5%   Diagnostic of Diabetes Mellitus            (if abnormal result is confirmed)   5.7-6.4%   Increased risk of developing Diabetes Mellitus   References:Diagnosis and Classification of Diabetes Mellitus,Diabetes UYQI,3474,25(ZDGLO 1):S62-S69 and Standards of Medical Care in         Diabetes - 2011,Diabetes VFIE,3329,51 (Suppl 1):S11-S61.    CBG: No results for input(s): "GLUCAP" in the last 168 hours.  Review of Systems:   Unable to obtain due to increase wob and on bipap. Info obtained from chart review and bedside rn.  Past Medical History:  She,  has a past medical history of Acute on chronic diastolic (congestive) heart failure ([place]) (06/25/2013), COPD (chronic obstructive pulmonary disease) ([place]), Cor pulmonale ([place]), Depression, Diabetes mellitus, External hemorrhoid (01/11/2020), GERD (gastroesophageal reflux disease), H/O mental retardation, Hyperlipidemia, Hypertension, OSA (obstructive sleep apnea), Pulmonary hypertension ([place]), Restrictive lung disease, Stage 3b chronic kidney disease (CKD) ([place]), Swelling of lower extremity (08/13/2020), Vagina bleeding (10/15/2019), and Venous stasis ulcer ([place]).   Surgical History:   Past Surgical History:  Procedure Laterality Date  . CARDIAC CATHETERIZATION  N/A 01/13/2016   Procedure: Right Heart Cath;  Surgeon: Jolaine Artist, MD;  Location: [facility];  Service: Cardiovascular;  Laterality: N/A;  . CHOLECYSTECTOMY    . COLONOSCOPY WITH PROPOFOL N/A 12/20/2019   Procedure: COLONOSCOPY WITH PROPOFOL;  Surgeon: [name], MD;  Location: [facility];  Service: Endoscopy;  Laterality: N/A;  . IR FLUORO GUIDE CV LINE LEFT  11/16/2019  . IR FLUORO GUIDE CV LINE LEFT  06/17/2020  . IR FLUORO GUIDE CV LINE LEFT  07/04/2020  . IR FLUORO GUIDE CV LINE LEFT  07/03/2021  . IR FLUORO GUIDE CV LINE LEFT  12/26/2021  . IR FLUORO GUIDE CV LINE RIGHT  05/09/2019  . IR FLUORO GUIDE CV LINE RIGHT  08/12/2019  . IR REMOVAL TUN CV CATH W/O FL  05/05/2019  . IR US GUIDE VASC ACCESS LEFT  11/16/2019  . IR US GUIDE VASC ACCESS LEFT  06/17/2020  . IR US GUIDE VASC ACCESS LEFT  07/04/2020  . IR US GUIDE VASC ACCESS LEFT  07/03/2021  . IR US GUIDE VASC ACCESS LEFT  12/26/2021  . IR US GUIDE VASC ACCESS RIGHT  05/09/2019  . IR US GUIDE VASC ACCESS RIGHT  08/12/2019  . IR US GUIDE VASC ACCESS RIGHT  11/16/2019  . IR VENO/JUGULAR RIGHT  11/16/2019  . LEFT AND RIGHT HEART CATHETERIZATION WITH CORONARY ANGIOGRAM N/A 02/28/2014   Procedure: LEFT AND RIGHT HEART CATHETERIZATION WITH CORONARY ANGIOGRAM;  Surgeon: Jolaine Artist, MD;  Location: [facility] C[facility] LAB;  Service: Cardiovascular;  Laterality: N/A;  . RIGHT HEART CATH N/A 06/27/2018   Procedure: RIGHT HEART CATH;  Surgeon: Jolaine Artist, MD;  Location: [facility];  Service: Cardiovascular;  Laterality: N/A;  . RIGHT/LEFT HEART CATH AND CORONARY ANGIOGRAPHY N/A 05/14/2019   Procedure: RIGHT/LEFT HEART CATH AND CORONARY ANGIOGRAPHY;  Surgeon: Jolaine Artist, MD;  Location: [facility];  Service: Cardiovascular;  Laterality: N/A;     Social History:   reports that she has never smoked. She has never used smokeless tobacco. She reports that she does not drink alcohol and does not use drugs.    Family History:  Her family history includes Bipolar disorder in her  sister; Breast cancer in her maternal aunt; Hyperlipidemia in her mother; Kidney Stones in her son; Mental illness in her sister; Mental retardation in her brother. There is no history of Colon cancer, Pancreatic cancer, or Esophageal cancer.   Allergies Allergies  Allergen Reactions  . Aspirin Swelling    REACTION: airway swelling  . Codeine Other (See Comments)    REACTION: tingling in lips and hard breathing - had reaction at dentist - states "I can't take certain kinds of codeine" - happened maybe 10 yr ago  . Lisinopril Cough  . Sulfonamide Derivatives Swelling    REACTION: airway swelling  . Latex Rash     Home Medications  Prior to Admission medications   Medication Sig Start Date End Date Taking? Authorizing Provider  ACCU-CHEK AVIVA PLUS test strip USE 1 STRIP TO CHECK GLUCOSE ONCE DAILY Patient taking differently: 1 each by Other route daily. 05/25/18   [name], MD  acetaminophen (TYLENOL) 500 MG tablet Take 1,000 mg by mouth every 6 (six) hours as needed for mild pain.    [provider]  carboxymethylcellulose (REFRESH PLUS) 0.5 % SOLN Place 1 drop into both eyes 3 (three) times daily as needed (eye irritation).    [provider]  cetirizine (ZYRTEC) 10 MG tablet TAKE ONE TABLET BY MOUTH DAILY Patient taking differently: Take 10 mg by mouth daily. 06/09/21   Masters, Katie, DO  clotrimazole-betamethasone (LOTRISONE) cream Apply 1 Application topically 2 (two) times daily. 09/21/21   Edrick Kins, DPM  cycloSPORINE (RESTASIS) 0.05 % ophthalmic emulsion Place 1 drop into both eyes 2 (two) times daily as needed (dry eyes). Patient not taking: Reported on 11/16/2021    [provider]  diphenhydrAMINE (BENADRYL) 25 MG tablet Take 1 tablet (25 mg total) by mouth every 6 (six) hours as needed. Patient taking differently: Take 25 mg by mouth every 6 (six) hours as needed for  allergies. 09/19/92   Campbell Stall P, DO  Ferrous Sulfate (IRON PO) Take 1 tablet by mouth daily.    [provider]  ferrous sulfate 325 (65 FE) MG EC tablet Take 325 mg by mouth daily with breakfast.    [provider]  Fluconazole POWD 1 Application by Does not apply route daily. Patient not taking: Reported on 11/16/2021 10/08/21   Delene Ruffini, MD  fluticasone [facility]) 50 MCG/ACT nasal spray Place 1 spray into both nostrils daily as needed for allergies. 05/13/20 12/26/21  [name], MD  gabapentin (NEURONTIN) 300 MG capsule TAKE TWO CAPSULES BY MOUTH THREE TIMES A DAY 01/15/22   Masters, Katie, DO  metFORMIN (GLUCOPHAGE) 500 MG tablet TAKE HALF TABLET BY MOUTH DAILY WITH BREAKFAST Patient taking differently: Take 250 mg by mouth See admin instructions. TAKE HALF TABLET BY MOUTH DAILY WITH BREAKFAST 12/17/21   Masters, Katie, DO  OXYGEN Inhale 4 L into the lungs continuous.    [provider]  pantoprazole (PROTONIX) 20 MG tablet TAKE ONE TABLET BY MOUTH TWICE A DAY 11/16/21   Masters, Katie, DO  potassium chloride SA (KLOR-CON M) 20 MEQ tablet TAKE TWO TABLETS BY MOUTH TWICE A DAY 09/14/21   Masters, Katie, DO  pravastatin (PRAVACHOL) 20 MG tablet TAKE ONE TABLET BY MOUTH DAILY 09/22/21   Masters, [name], DO  sildenafil (REVATIO) 20 MG tablet Take 1 tablet (20 mg total) by mouth 3 (three) times daily. 09/21/21   Bensimhon, [name], MD  spironolactone (ALDACTONE) 25 MG tablet TAKE HALF TABLET BY MOUTH DAILY 06/09/21  Masters, Scientist, research (physical sciences), DO  torsemide (DEMADEX) 20 MG tablet TAKE FOUR TABLETS BY MOUTH TWICE A DAY 01/15/22   Masters, Katie, DO  treprostinil (REMODULIN) 5 MG/ML SOLN injection See admin instructions. As of 08/09/2019: Add 7 ml of Remodulin to cassette and 93 ml of sterile diluent for Remodulin to cassetet for a total volume of 100 ml to make a concentration of 350,000 ng per ml.  Infuse via a CADD pump intravenously at a rate of 40 ml per 24 hours. Based on  a dosing weight of 120 kg, the dose is 81 ng per kg per min.  Once opened, discard Remodulin vial after 30 days.  Sterile Diluent for Remodulin vials are single use only. Once mixed, discard cassette after 48 hours.    [provider]  vitamin B-12 (CYANOCOBALAMIN) 500 MCG tablet TAKE ONE TABLET BY MOUTH DAILY 10/19/21   Masters, Katie, DO  XARELTO 20 MG TABS tablet TAKE ONE TABLET BY MOUTH DAILY WITH SUPPER Patient taking differently: 20 mg. 11/16/21   Masters, [name], DO     Critical care time: 45 minutes    JD Rexene [facility] 01/13/2022, 10:59 PM  Please see [email or web address] for pager details.  From 7A-7P if no response, please call [phone]. After hours, please call ELink [phone].

## 2022-01-31 NOTE — H&P (Incomplete)
NAME:  [name], MRN:  [id number], DOB:  April 19, 1960, LOS: 0 ADMISSION DATE:  02/02/2022, CONSULTATION DATE:  1/28 REFERRING MD:  Dr. Regenia Skeeter, CHIEF COMPLAINT:  Acute hypoxic resp failure; chf vs. Copd exacerbation   History of Present Illness:  Patient is a 62 year old female with pertinent PMH chronic diastolic HF, cor pulmonale, severe pulmonary hypertension (on Remodulin and sildenafil), A-fib on Xarelto, OSA on CPAP presents to [facility] ED on 5/28 with SOB.  Patient states her breathing has worsened over the past few days.  States she is not very compliant with her medications at home.  States her breathing has gotten a lot more swollen over the past few days making it harder on her breathing.  On 1/28 her breathing was much worse and called EMS.  EMS found patient with sats 72% on room air and initially placed on O2 but required CPAP.  Upon arrival to [facility] ED on 1/28, patient in acute respiratory distress on BiPAP.  Initial BP 99/50.  RR 30s.  Afebrile but wbc 25.4 EKG with no signs of ischemia.  Given 80 IV Lasix. CXR w/ R basilar opacity. Given rocephin/azithro for possible cap.  Cultures obtained.  HF notified by ED physician for consult. PCCM consulted for ICU admission.  Pertinent ED labs: COVID/flu/RSV negative, NA 128, potassium 2.5, mag 1.8, creat 1.03, BNP 449,Troponin 371 troponin 371 then 373, VBG 7.46, 39, 50, 28.  Pertinent  Medical History   Past Medical History:  Diagnosis Date   Acute on chronic diastolic (congestive) heart failure ([place]) 06/25/2013   COPD (chronic obstructive pulmonary disease) (HCC)    Cor pulmonale (HCC)    PA Peak pressure 51mHg   Depression    Diabetes mellitus    well controlled on metformin   External hemorrhoid 01/11/2020   GERD (gastroesophageal reflux disease)    H/O mental retardation    Hyperlipidemia    Hypertension    OSA (obstructive sleep apnea)    CPAP   Pulmonary hypertension (HCC)    Restrictive lung disease    PFTs 06/2012  (FVC 54% predicted and FEV1 68% predicted w minimal bronchodilator response).   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    [place], ?followed up at wound care center, multiple courses of antibiotics in past for cellulitis, on lasix     Significant Hospital Events: Including procedures, antibiotic start and stop dates in addition to other pertinent events   1/28 admitted likely chf exacerbation on bipap  Interim History / Subjective:  See above  Objective   Blood pressure 104/68, pulse 94, temperature 98.7 F (37.1 C), temperature source Axillary, resp. rate (!) 28, height '5\' 3"'$  (1.6 m), weight 117.9 kg, last menstrual period 02/19/2011, SpO2 90 %.    FiO2 (%):  [50 %] 50 %   Intake/Output Summary (Last 24 hours) at 01/14/2022 2259 Last data filed at 01/10/2022 2128 Gross per 24 hour  Intake 347.62 ml  Output --  Net 347.62 ml   Filed Weights   01/07/2022 1928  Weight: 117.9 kg    Examination: General:   ill appearing on bipap w/ resp distress HEENT: MM pink/moist; bipap in place Neuro: Aox3; MAE CV: s1s2, afib rate 70s, no m/r/g PULM:  dim BS bilaterally; on bipap 80% sats 91%; unable to speak without getting sob GI: abdominal distention with fluid wave, soft, bsx4 active  Extremities: warm/dry, BLE  edema chronic venous insufficiency; UNNA boot on left Skin: no rashes or lesions appreciated   Resolved Hospital Problem list     Assessment & Plan:  Acute respiratory failure w/ hypoxia Likely CHF exacerbation: patient states non compliant w/ medications Possible CAP OSA on cpap COPD?: not on any inhalers at home Plan: -cont bipap -lasix given; monitor UOP -rocephin/azithro for cap ppx -check rvp, urine leg/strept -prn duoneb -patient unable to lay flat for CTA chest to rule out PE; will check dvt ultrasound and check d-dimer  Acute on chronic diastolic chf/cor  pulmonale Pulmonary HTN -follows w/ [facility] Plan: -HF team consulted; appreciate recs -IV lasix given; monitor UOP -echo -consider resuming home spironolactone and torsemide -resume home remodulin and sildenafil  -daily  Possible sepsis -likely pneumonia Plan: -trend LA -abx as above for cap ppx -f/u cultures, ua -check pct -trend wbc/fever curve  Abdominal distention Plan: -likely ascites from hypervolemia -IV lasix given -consider resuming home spironolactone in am -will likely need paracentesis  Elevated trop Plan: -likely demand ischemia -trend trop  Afib on xarelto Plan: -tele -heparin gtt -rate controlled  Hypokalemia Hyponatremia: likely hypervolemia Elevated creat Plan: -giving K -Lasix given -Trend BMP / urinary output -Replace electrolytes as indicated -Avoid nephrotoxic agents, ensure adequate renal perfusion  Chronic venous insufficiency Plan: -dvt ultrasound -Unna boot  HLD Plan: -resume statin  DMT2 Plan: -ssi and cbg monitoring -A1c  GERD Plan: -PPI  Best Practice (right click and "Reselect all SmartList Selections" daily)   Diet/type: NPO w/ oral meds DVT prophylaxis: systemic heparin GI prophylaxis: PPI Lines: N/A Foley:  N/A Code Status:  full code Last date of multidisciplinary goals of care discussion [1/28 updated patient at bedside. Attempted to update son Legrand Como over phone but no answer]  Labs   CBC: Recent Labs  Lab 01/05/2022 1940 01/26/2022 1950 01/14/2022 2140  WBC 25.4*  --   --   NEUTROABS 22.6*  --   --   HGB 14.1 15.3* 15.0  HCT 41.3 45.0 44.0  MCV 84.1  --   --   PLT 186  --   --     Basic Metabolic Panel: Recent Labs  Lab 01/28/2022 1950 01/27/2022 2135 01/22/2022 2140  NA 129*  --  131*  K 2.5*  --  2.5*  MG  --  1.8  --    GFR: CrCl cannot be calculated (Patient's most recent lab result is older than the maximum 21 days allowed.). Recent Labs  Lab 02/02/2022 1940  WBC 25.4*     Liver Function Tests: No results for input(s): "AST", "ALT", "ALKPHOS", "BILITOT", "PROT", "ALBUMIN" in the last 168 hours. No results for input(s): "LIPASE", "AMYLASE" in the last 168 hours. No results for input(s): "AMMONIA" in the last 168 hours.  ABG    Component Value Date/Time   PHART 7.358 05/14/2019 1340   PCO2ART 33.3 05/14/2019 1340   PO2ART 83 05/14/2019 1340   HCO3 29.2 (H) 01/13/2022 2140   TCO2 30 01/11/2022 2140   ACIDBASEDEF 6.0 (H) 05/14/2019 1340   O2SAT 78 01/08/2022 2140     Coagulation Profile: No results for input(s): "INR", "PROTIME" in the last 168 hours.  Cardiac Enzymes: No results for input(s): "CKTOTAL", "CKMB", "CKMBINDEX", "TROPONINI" in the last 168 hours.  HbA1C: Hemoglobin A1C  Date/Time Value Ref Range Status  09/03/2021 03:46 PM 5.7 (A) 4.0 - 5.6 % Final  05/05/2020 09:24 AM 5.6 4.0 - 5.6 % Final   Hgb A1c MFr Bld  Date/Time  Value Ref Range Status  10/14/2010 10:41 AM 13.5 (H) <5.7 % Final    Comment:                                                                           According to the ADA Clinical Practice Recommendations for 2011, when HbA1c is used as a screening test:     >=6.5%   Diagnostic of Diabetes Mellitus            (if abnormal result is confirmed)   5.7-6.4%   Increased risk of developing Diabetes Mellitus   References:Diagnosis and Classification of Diabetes Mellitus,Diabetes FYBO,1751,02(HENID 1):S62-S69 and Standards of Medical Care in         Diabetes - 2011,Diabetes POEU,2353,61 (Suppl 1):S11-S61.    CBG: No results for input(s): "GLUCAP" in the last 168 hours.  Review of Systems:   Unable to obtain due to increase wob and on bipap. Info obtained from chart review and bedside rn.  Past Medical History:  She,  has a past medical history of Acute on chronic diastolic (congestive) heart failure ([place]) (06/25/2013), COPD (chronic obstructive pulmonary disease) ([place]), Cor pulmonale ([place]), Depression,  Diabetes mellitus, External hemorrhoid (01/11/2020), GERD (gastroesophageal reflux disease), H/O mental retardation, Hyperlipidemia, Hypertension, OSA (obstructive sleep apnea), Pulmonary hypertension ([place]), Restrictive lung disease, Stage 3b chronic kidney disease (CKD) ([place]), Swelling of lower extremity (08/13/2020), Vagina bleeding (10/15/2019), and Venous stasis ulcer ([place]).   Surgical History:   Past Surgical History:  Procedure Laterality Date   CARDIAC CATHETERIZATION N/A 01/13/2016   Procedure: Right Heart Cath;  Surgeon: Jolaine Artist, MD;  Location: [place] [facility];  Service: Cardiovascular;  Laterality: N/A;   CHOLECYSTECTOMY     COLONOSCOPY WITH PROPOFOL N/A 12/20/2019   Procedure: COLONOSCOPY WITH PROPOFOL;  Surgeon: [name], MD;  Location: [facility];  Service: Endoscopy;  Laterality: N/A;   IR FLUORO GUIDE CV LINE LEFT  11/16/2019   IR FLUORO GUIDE CV LINE LEFT  06/17/2020   IR FLUORO GUIDE CV LINE LEFT  07/04/2020   IR FLUORO GUIDE CV LINE LEFT  07/03/2021   IR FLUORO GUIDE CV LINE LEFT  12/26/2021   IR FLUORO GUIDE CV LINE RIGHT  05/09/2019   IR FLUORO GUIDE CV LINE RIGHT  08/12/2019   IR REMOVAL TUN CV CATH W/O FL  05/05/2019   IR US GUIDE VASC ACCESS LEFT  11/16/2019   IR US GUIDE VASC ACCESS LEFT  06/17/2020   IR US GUIDE VASC ACCESS LEFT  07/04/2020   IR US GUIDE VASC ACCESS LEFT  07/03/2021   IR US GUIDE VASC ACCESS LEFT  12/26/2021   IR US GUIDE VASC ACCESS RIGHT  05/09/2019   IR US GUIDE VASC ACCESS RIGHT  08/12/2019   IR US GUIDE VASC ACCESS RIGHT  11/16/2019   IR VENO/JUGULAR RIGHT  11/16/2019   LEFT AND RIGHT HEART CATHETERIZATION WITH CORONARY ANGIOGRAM N/A 02/28/2014   Procedure: LEFT AND RIGHT HEART CATHETERIZATION WITH CORONARY ANGIOGRAM;  Surgeon: Jolaine Artist, MD;  Location: [facility] C[facility] LAB;  Service: Cardiovascular;  Laterality: N/A;   RIGHT HEART CATH N/A 06/27/2018   Procedure: RIGHT HEART CATH;  Surgeon: Jolaine Artist, MD;  Location: [place]  CV LAB;  Service: Cardiovascular;  Laterality: N/A;   RIGHT/LEFT HEART CATH AND CORONARY ANGIOGRAPHY N/A 05/14/2019   Procedure: RIGHT/LEFT HEART CATH AND CORONARY ANGIOGRAPHY;  Surgeon: Jolaine Artist, MD;  Location: [place] [facility];  Service: Cardiovascular;  Laterality: N/A;     Social History:   reports that she has never smoked. She has never used smokeless tobacco. She reports that she does not drink alcohol and does not use drugs.   Family History:  Her family history includes Bipolar disorder in her sister; Breast cancer in her maternal aunt; Hyperlipidemia in her mother; Kidney Stones in her son; Mental illness in her sister; Mental retardation in her brother. There is no history of Colon cancer, Pancreatic cancer, or Esophageal cancer.   Allergies Allergies  Allergen Reactions   Aspirin Swelling    REACTION: airway swelling   Codeine Other (See Comments)    REACTION: tingling in lips and hard breathing - had reaction at dentist - states "I can't take certain kinds of codeine" - happened maybe 10 yr ago   Lisinopril Cough   Sulfonamide Derivatives Swelling    REACTION: airway swelling   Latex Rash     Home Medications  Prior to Admission medications   Medication Sig Start Date End Date Taking? Authorizing Provider  ACCU-CHEK AVIVA PLUS test strip USE 1 STRIP TO CHECK GLUCOSE ONCE DAILY Patient taking differently: 1 each by Other route daily. 05/25/18   [name], MD  acetaminophen (TYLENOL) 500 MG tablet Take 1,000 mg by mouth every 6 (six) hours as needed for mild pain.    [provider]  carboxymethylcellulose (REFRESH PLUS) 0.5 % SOLN Place 1 drop into both eyes 3 (three) times daily as needed (eye irritation).    [provider]  cetirizine (ZYRTEC) 10 MG tablet TAKE ONE TABLET BY MOUTH DAILY Patient taking differently: Take 10 mg by mouth daily. 06/09/21   Masters, Katie, DO  clotrimazole-betamethasone (LOTRISONE) cream Apply 1  Application topically 2 (two) times daily. 09/21/21   Edrick Kins, DPM  cycloSPORINE (RESTASIS) 0.05 % ophthalmic emulsion Place 1 drop into both eyes 2 (two) times daily as needed (dry eyes). Patient not taking: Reported on 11/16/2021    [provider]  diphenhydrAMINE (BENADRYL) 25 MG tablet Take 1 tablet (25 mg total) by mouth every 6 (six) hours as needed. Patient taking differently: Take 25 mg by mouth every 6 (six) hours as needed for allergies. 0/63/01   Campbell Stall P, DO  Ferrous Sulfate (IRON PO) Take 1 tablet by mouth daily.    [provider]  ferrous sulfate 325 (65 FE) MG EC tablet Take 325 mg by mouth daily with breakfast.    [provider]  Fluconazole POWD 1 Application by Does not apply route daily. Patient not taking: Reported on 11/16/2021 10/08/21   Delene Ruffini, MD  fluticasone [facility]) 50 MCG/ACT nasal spray Place 1 spray into both nostrils daily as needed for allergies. 05/13/20 12/26/21  [name], MD  gabapentin (NEURONTIN) 300 MG capsule TAKE TWO CAPSULES BY MOUTH THREE TIMES A DAY 01/15/22   Masters, Katie, DO  metFORMIN (GLUCOPHAGE) 500 MG tablet TAKE HALF TABLET BY MOUTH DAILY WITH BREAKFAST Patient taking differently: Take 250 mg by mouth See admin instructions. TAKE HALF TABLET BY MOUTH DAILY WITH BREAKFAST 12/17/21   Masters, Katie, DO  OXYGEN Inhale 4 L into the lungs continuous.    [provider]  pantoprazole (PROTONIX) 20 MG tablet TAKE ONE  TABLET BY MOUTH TWICE A DAY 11/16/21   Masters, Katie, DO  potassium chloride SA (KLOR-CON M) 20 MEQ tablet TAKE TWO TABLETS BY MOUTH TWICE A DAY 09/14/21   Masters, Katie, DO  pravastatin (PRAVACHOL) 20 MG tablet TAKE ONE TABLET BY MOUTH DAILY 09/22/21   Masters, [name], DO  sildenafil (REVATIO) 20 MG tablet Take 1 tablet (20 mg total) by mouth 3 (three) times daily. 09/21/21   Bensimhon, [name], MD  spironolactone (ALDACTONE) 25 MG tablet TAKE HALF TABLET BY MOUTH DAILY  06/09/21   Masters, [name], DO  torsemide (DEMADEX) 20 MG tablet TAKE FOUR TABLETS BY MOUTH TWICE A DAY 01/15/22   Masters, Katie, DO  treprostinil (REMODULIN) 5 MG/ML SOLN injection See admin instructions. As of 08/09/2019: Add 7 ml of Remodulin to cassette and 93 ml of sterile diluent for Remodulin to cassetet for a total volume of 100 ml to make a concentration of 350,000 ng per ml.  Infuse via a CADD pump intravenously at a rate of 40 ml per 24 hours. Based on a dosing weight of 120 kg, the dose is 81 ng per kg per min.  Once opened, discard Remodulin vial after 30 days.  Sterile Diluent for Remodulin vials are single use only. Once mixed, discard cassette after 48 hours.    [provider]  vitamin B-12 (CYANOCOBALAMIN) 500 MCG tablet TAKE ONE TABLET BY MOUTH DAILY 10/19/21   Masters, Katie, DO  XARELTO 20 MG TABS tablet TAKE ONE TABLET BY MOUTH DAILY WITH SUPPER Patient taking differently: 20 mg. 11/16/21   Masters, [name], DO     Critical care time: 45 minutes    JD Rexene Agent [facility] 01/05/2022, 10:59 PM  Please see [email or web address] for pager details.  From 7A-7P if no response, please call [phone]. After hours, please call ELink [phone].

## 2022-01-31 NOTE — ED Notes (Signed)
Date and time results received: 01/15/2022 2244 (use smartphrase ".now" to insert current time)  Test: potassium Critical Value: 2.5  Name of Provider Notified: dr Verner Chol  Orders Received? Or Actions Taken?:

## 2022-01-31 NOTE — ED Notes (Signed)
Date and time results received: 01/06/2022 2244  (use smartphrase ".now" to insert current time)  Test: troponin  Critical Value: 373   Name of Provider Notified: Verner Chol, md  Orders Received? Or Actions Taken?:

## 2022-01-31 NOTE — ED Provider Notes (Signed)
[facility] Provider Note   CSN: [id number] Arrival date & time: 01/23/2022  1924     History {Add pertinent medical, surgical, social history, OB history to HPI:1} Chief Complaint  Patient presents with   Shortness of Breath    [name] is a 62 y.o. female.  [name] is a 62 year old female past medical history of right-sided heart failure, severe pulmonary hypertension (on Remodulin pump and oral sildenafil), A-fib on Xarelto presenting to the emergency department for shortness of breath.  Patient states that she has been intermittent with her medications recently and over the past 3 to 4 days has noticed increasing worsening of breathing to the point where she called EMS out Per EMS patient was satting 72% on room air with only minor improvement on home 4 L.  Initially tried nonrebreather with sats reaching up into the 80s placed on CPAP and route.  Denies chest pain but states that her stomach is gradually gotten swollen over the past 3 to 4 days which is made her breathing and even more difficult.    Shortness of Breath      Home Medications Prior to Admission medications   Medication Sig Start Date End Date Taking? Authorizing Provider  ACCU-CHEK AVIVA PLUS test strip USE 1 STRIP TO CHECK GLUCOSE ONCE DAILY Patient taking differently: 1 each by Other route daily. 05/25/18   [name], MD  acetaminophen (TYLENOL) 500 MG tablet Take 1,000 mg by mouth every 6 (six) hours as needed for mild pain.    [provider]  carboxymethylcellulose (REFRESH PLUS) 0.5 % SOLN Place 1 drop into both eyes 3 (three) times daily as needed (eye irritation).    [provider]  cetirizine (ZYRTEC) 10 MG tablet TAKE ONE TABLET BY MOUTH DAILY Patient taking differently: Take 10 mg by mouth daily. 06/09/21   Masters, Katie, DO  clotrimazole-betamethasone (LOTRISONE) cream Apply 1 Application topically 2 (two) times  daily. 09/21/21   Edrick Kins, DPM  cycloSPORINE (RESTASIS) 0.05 % ophthalmic emulsion Place 1 drop into both eyes 2 (two) times daily as needed (dry eyes). Patient not taking: Reported on 11/16/2021    [provider]  diphenhydrAMINE (BENADRYL) 25 MG tablet Take 1 tablet (25 mg total) by mouth every 6 (six) hours as needed. Patient taking differently: Take 25 mg by mouth every 6 (six) hours as needed for allergies. 9/62/95   Campbell Stall P, DO  Ferrous Sulfate (IRON PO) Take 1 tablet by mouth daily.    [provider]  ferrous sulfate 325 (65 FE) MG EC tablet Take 325 mg by mouth daily with breakfast.    [provider]  Fluconazole POWD 1 Application by Does not apply route daily. Patient not taking: Reported on 11/16/2021 10/08/21   Delene Ruffini, MD  fluticasone [facility]) 50 MCG/ACT nasal spray Place 1 spray into both nostrils daily as needed for allergies. 05/13/20 12/26/21  [name], MD  gabapentin (NEURONTIN) 300 MG capsule TAKE TWO CAPSULES BY MOUTH THREE TIMES A DAY 01/15/22   Masters, Katie, DO  metFORMIN (GLUCOPHAGE) 500 MG tablet TAKE HALF TABLET BY MOUTH DAILY WITH BREAKFAST Patient taking differently: Take 250 mg by mouth See admin instructions. TAKE HALF TABLET BY MOUTH DAILY WITH BREAKFAST 12/17/21   Masters, Katie, DO  OXYGEN Inhale 4 L into the lungs continuous.    [provider]  pantoprazole (PROTONIX) 20 MG tablet TAKE ONE TABLET BY MOUTH TWICE A  DAY 11/16/21   Masters, Katie, DO  potassium chloride SA (KLOR-CON M) 20 MEQ tablet TAKE TWO TABLETS BY MOUTH TWICE A DAY 09/14/21   Masters, Katie, DO  pravastatin (PRAVACHOL) 20 MG tablet TAKE ONE TABLET BY MOUTH DAILY 09/22/21   Masters, [name], DO  sildenafil (REVATIO) 20 MG tablet Take 1 tablet (20 mg total) by mouth 3 (three) times daily. 09/21/21   Bensimhon, [name], MD  spironolactone (ALDACTONE) 25 MG tablet TAKE HALF TABLET BY MOUTH DAILY 06/09/21   Masters, [name], DO  torsemide  (DEMADEX) 20 MG tablet TAKE FOUR TABLETS BY MOUTH TWICE A DAY 01/15/22   Masters, Katie, DO  treprostinil (REMODULIN) 5 MG/ML SOLN injection See admin instructions. As of 08/09/2019: Add 7 ml of Remodulin to cassette and 93 ml of sterile diluent for Remodulin to cassetet for a total volume of 100 ml to make a concentration of 350,000 ng per ml.  Infuse via a CADD pump intravenously at a rate of 40 ml per 24 hours. Based on a dosing weight of 120 kg, the dose is 81 ng per kg per min.  Once opened, discard Remodulin vial after 30 days.  Sterile Diluent for Remodulin vials are single use only. Once mixed, discard cassette after 48 hours.    [provider]  vitamin B-12 (CYANOCOBALAMIN) 500 MCG tablet TAKE ONE TABLET BY MOUTH DAILY 10/19/21   Masters, Katie, DO  XARELTO 20 MG TABS tablet TAKE ONE TABLET BY MOUTH DAILY WITH SUPPER Patient taking differently: 20 mg. 11/16/21   Masters, [name], DO      Allergies    Aspirin, Codeine, Lisinopril, Sulfonamide derivatives, and Latex    Review of Systems   Review of Systems  Respiratory:  Positive for shortness of breath.     Physical Exam Updated Vital Signs Pulse 94   Resp (!) 45   Ht '5\' 3"'$  (1.6 m)   Wt 117.9 kg   LMP 02/19/2011   SpO2 93%   BMI 46.04 kg/m  Physical Exam  ED Results / Procedures / Treatments   Labs (all labs ordered are listed, but only abnormal results are displayed) Labs Reviewed  RESP PANEL BY RT-PCR (RSV, FLU A&B, COVID)  [id number]  CBC WITH DIFFERENTIAL/PLATELET  COMPREHENSIVE METABOLIC PANEL  BRAIN NATRIURETIC PEPTIDE  TROPONIN I (HIGH SENSITIVITY)    EKG None  Radiology No results found.  Procedures Procedures  {Document cardiac monitor, telemetry assessment procedure when appropriate:1}  Medications Ordered in ED Medications - No data to display  ED Course/ Medical Decision Making/ A&P   {   Click here for ABCD2, HEART and other calculatorsREFRESH Note before signing :1}                           Medical Decision Making Amount and/or Complexity of Data Reviewed Labs: ordered. Radiology: ordered.  Risk Prescription drug management.   ***  {Document critical care time when appropriate:1} {Document review of labs and clinical decision tools ie heart score, Chads2Vasc2 etc:1}  {Document your independent review of radiology images, and any outside records:1} {Document your discussion with family members, caretakers, and with consultants:1} {Document social determinants of health affecting pt's care:1} {Document your decision making why or why not admission, treatments were needed:1} Final Clinical Impression(s) / ED Diagnoses Final diagnoses:  None    Rx / DC Orders ED Discharge Orders     None

## 2022-02-01 ENCOUNTER — Inpatient Hospital Stay (HOSPITAL_COMMUNITY): Payer: Medicaid Other

## 2022-02-01 ENCOUNTER — Inpatient Hospital Stay: Payer: Self-pay

## 2022-02-01 DIAGNOSIS — J9621 Acute and chronic respiratory failure with hypoxia: Secondary | ICD-10-CM | POA: Diagnosis not present

## 2022-02-01 DIAGNOSIS — E876 Hypokalemia: Secondary | ICD-10-CM

## 2022-02-01 DIAGNOSIS — R0602 Shortness of breath: Secondary | ICD-10-CM

## 2022-02-01 DIAGNOSIS — I50813 Acute on chronic right heart failure: Secondary | ICD-10-CM | POA: Diagnosis not present

## 2022-02-01 DIAGNOSIS — I272 Pulmonary hypertension, unspecified: Secondary | ICD-10-CM

## 2022-02-01 DIAGNOSIS — J189 Pneumonia, unspecified organism: Secondary | ICD-10-CM | POA: Diagnosis not present

## 2022-02-01 DIAGNOSIS — I5032 Chronic diastolic (congestive) heart failure: Secondary | ICD-10-CM

## 2022-02-01 DIAGNOSIS — E871 Hypo-osmolality and hyponatremia: Secondary | ICD-10-CM

## 2022-02-01 DIAGNOSIS — R7881 Bacteremia: Secondary | ICD-10-CM | POA: Diagnosis not present

## 2022-02-01 DIAGNOSIS — J9601 Acute respiratory failure with hypoxia: Secondary | ICD-10-CM | POA: Diagnosis not present

## 2022-02-01 DIAGNOSIS — T80211A Bloodstream infection due to central venous catheter, initial encounter: Secondary | ICD-10-CM

## 2022-02-01 DIAGNOSIS — I5031 Acute diastolic (congestive) heart failure: Secondary | ICD-10-CM

## 2022-02-01 DIAGNOSIS — M7989 Other specified soft tissue disorders: Secondary | ICD-10-CM

## 2022-02-01 DIAGNOSIS — I4891 Unspecified atrial fibrillation: Secondary | ICD-10-CM

## 2022-02-01 DIAGNOSIS — T80219A Unspecified infection due to central venous catheter, initial encounter: Secondary | ICD-10-CM

## 2022-02-01 DIAGNOSIS — R7989 Other specified abnormal findings of blood chemistry: Secondary | ICD-10-CM

## 2022-02-01 LAB — BASIC METABOLIC PANEL
Anion gap: 12 (ref 5–15)
Anion gap: 14 (ref 5–15)
Anion gap: 10 (ref 5–15)
BUN: 35 mg/dL — ABNORMAL HIGH (ref 6–20)
BUN: 37 mg/dL — ABNORMAL HIGH (ref 6–20)
BUN: 38 mg/dL — ABNORMAL HIGH (ref 6–20)
CO2: 28 mmol/L (ref 22–32)
CO2: 27 mmol/L (ref 22–32)
CO2: 29 mmol/L (ref 22–32)
Calcium: 8.2 mg/dL — ABNORMAL LOW (ref 8.9–10.3)
Calcium: 8.5 mg/dL — ABNORMAL LOW (ref 8.9–10.3)
Calcium: 8.3 mg/dL — ABNORMAL LOW (ref 8.9–10.3)
Chloride: 90 mmol/L — ABNORMAL LOW (ref 98–111)
Chloride: 91 mmol/L — ABNORMAL LOW (ref 98–111)
Chloride: 95 mmol/L — ABNORMAL LOW (ref 98–111)
Creatinine, Ser: 1.01 mg/dL — ABNORMAL HIGH (ref 0.44–1.00)
Creatinine, Ser: 1.09 mg/dL — ABNORMAL HIGH (ref 0.44–1.00)
Creatinine, Ser: 0.91 mg/dL (ref 0.44–1.00)
GFR, Estimated: >60 mL/min (ref >60)
GFR, Estimated: 58 mL/min — ABNORMAL LOW (ref >60)
GFR, Estimated: >60 mL/min (ref >60)
Glucose, Bld: 180 mg/dL — ABNORMAL HIGH (ref 70–99)
Glucose, Bld: 176 mg/dL — ABNORMAL HIGH (ref 70–99)
Glucose, Bld: 169 mg/dL — ABNORMAL HIGH (ref 70–99)
Potassium: 2.8 mmol/L — ABNORMAL LOW (ref 3.5–5.1)
Potassium: 3.5 mmol/L (ref 3.5–5.1)
Potassium: 3.9 mmol/L (ref 3.5–5.1)
Sodium: 130 mmol/L — ABNORMAL LOW (ref 135–145)
Sodium: 132 mmol/L — ABNORMAL LOW (ref 135–145)
Sodium: 134 mmol/L — ABNORMAL LOW (ref 135–145)

## 2022-02-01 LAB — BLOOD CULTURE ID PANEL (REFLEXED) - BCID2

## 2022-02-01 LAB — POCT I-STAT 7, (LYTES, BLD GAS, ICA,H+H)
Acid-Base Excess: 4 mmol/L — ABNORMAL HIGH (ref 0.0–2.0)
Bicarbonate: 30.2 mmol/L — ABNORMAL HIGH (ref 20.0–28.0)
Calcium, Ion: 1.18 mmol/L (ref 1.15–1.40)
HCT: 42 % (ref 36.0–46.0)
Hemoglobin: 14.3 g/dL (ref 12.0–15.0)
O2 Saturation: 88 %
Patient temperature: 98.4
Potassium: 3.2 mmol/L — ABNORMAL LOW (ref 3.5–5.1)
Sodium: 131 mmol/L — ABNORMAL LOW (ref 135–145)
TCO2: 32 mmol/L (ref 22–32)
pCO2 arterial: 51.9 mmHg — ABNORMAL HIGH (ref 32–48)
pH, Arterial: 7.373 (ref 7.35–7.45)
pO2, Arterial: 57 mmHg — ABNORMAL LOW (ref 83–108)

## 2022-02-01 LAB — GLUCOSE, CAPILLARY
Glucose-Capillary: 152 mg/dL — ABNORMAL HIGH (ref 70–99)
Glucose-Capillary: 154 mg/dL — ABNORMAL HIGH (ref 70–99)
Glucose-Capillary: 155 mg/dL — ABNORMAL HIGH (ref 70–99)
Glucose-Capillary: 171 mg/dL — ABNORMAL HIGH (ref 70–99)
Glucose-Capillary: 173 mg/dL — ABNORMAL HIGH (ref 70–99)
Glucose-Capillary: 187 mg/dL — ABNORMAL HIGH (ref 70–99)
Glucose-Capillary: 205 mg/dL — ABNORMAL HIGH (ref 70–99)

## 2022-02-01 LAB — CBC
HCT: 39.8 % (ref 36.0–46.0)
Hemoglobin: 13.8 g/dL (ref 12.0–15.0)
MCH: 29.1 pg (ref 26.0–34.0)
MCHC: 34.7 g/dL (ref 30.0–36.0)
MCV: 84 fL (ref 80.0–100.0)
Platelets: 164 10*3/uL (ref 150–400)
RBC: 4.74 MIL/uL (ref 3.87–5.11)
RDW: 16.2 % — ABNORMAL HIGH (ref 11.5–15.5)
WBC: 24.6 10*3/uL — ABNORMAL HIGH (ref 4.0–10.5)
nRBC: 0.2 % (ref 0.0–0.2)

## 2022-02-01 LAB — D-DIMER, QUANTITATIVE: D-Dimer, Quant: 2.68 ug/mL-FEU — ABNORMAL HIGH (ref 0.00–0.50)

## 2022-02-01 LAB — MAGNESIUM: Magnesium: 2.5 mg/dL — ABNORMAL HIGH (ref 1.7–2.4)

## 2022-02-01 LAB — ECHOCARDIOGRAM COMPLETE
Area-P 1/2: 2.86 cm2
Height: 63 in
MV VTI: 3.08 cm2
S' Lateral: 3.5 cm
Weight: 4105.85 oz

## 2022-02-01 LAB — URINALYSIS, COMPLETE (UACMP) WITH MICROSCOPIC
Bilirubin Urine: NEGATIVE
Glucose, UA: NEGATIVE mg/dL
Ketones, ur: NEGATIVE mg/dL
Leukocytes,Ua: NEGATIVE
Nitrite: NEGATIVE
Protein, ur: NEGATIVE mg/dL
Specific Gravity, Urine: 1.006 (ref 1.005–1.030)
pH: 5 (ref 5.0–8.0)

## 2022-02-01 LAB — TROPONIN I (HIGH SENSITIVITY)
Troponin I (High Sensitivity): 299 ng/L (ref <18)
Troponin I (High Sensitivity): 204 ng/L (ref <18)
Troponin I (High Sensitivity): 188 ng/L (ref <18)

## 2022-02-01 LAB — HEPARIN LEVEL (UNFRACTIONATED)
Heparin Unfractionated: <0.1 IU/mL — ABNORMAL LOW (ref 0.30–0.70)
Heparin Unfractionated: <0.1 IU/mL — ABNORMAL LOW (ref 0.30–0.70)

## 2022-02-01 LAB — LACTIC ACID, PLASMA: Lactic Acid, Venous: 1.1 mmol/L (ref 0.5–1.9)

## 2022-02-01 LAB — HIV ANTIBODY (ROUTINE TESTING W REFLEX): HIV Screen 4th Generation wRfx: NONREACTIVE

## 2022-02-01 LAB — STREP PNEUMONIAE URINARY ANTIGEN: Strep Pneumo Urinary Antigen: NEGATIVE

## 2022-02-01 LAB — MRSA NEXT GEN BY PCR, NASAL: MRSA by PCR Next Gen: NOT DETECTED

## 2022-02-01 LAB — HEMOGLOBIN A1C
Hgb A1c MFr Bld: 6.1 % — ABNORMAL HIGH (ref 4.8–5.6)
Mean Plasma Glucose: 128.37 mg/dL

## 2022-02-01 LAB — PROCALCITONIN: Procalcitonin: 2.02 ng/mL

## 2022-02-01 MED ORDER — CEFAZOLIN SODIUM-DEXTROSE 2-4 GM/100ML-% IV SOLN
2.0000 g | Freq: Three times a day (TID) | INTRAVENOUS | Status: DC
  Administered 2022-02-01 – 2022-02-03 (×6): 2 g via INTRAVENOUS
  Filled 2022-02-01 (×6): qty 100

## 2022-02-01 MED ORDER — POTASSIUM CHLORIDE 10 MEQ/50ML IV SOLN
10.0000 meq | INTRAVENOUS | Status: AC
  Administered 2022-02-01 (×4): 10 meq via INTRAVENOUS
  Filled 2022-02-01 (×4): qty 50

## 2022-02-01 MED ORDER — PANTOPRAZOLE SODIUM 20 MG PO TBEC
20.0000 mg | DELAYED_RELEASE_TABLET | Freq: Two times a day (BID) | ORAL | Status: DC
  Administered 2022-02-01 – 2022-02-08 (×15): 20 mg via ORAL
  Filled 2022-02-01 (×17): qty 1

## 2022-02-01 MED ORDER — HEPARIN BOLUS VIA INFUSION
3000.0000 [IU] | Freq: Once | INTRAVENOUS | Status: AC
  Administered 2022-02-01: 3000 [IU] via INTRAVENOUS
  Filled 2022-02-01: qty 3000

## 2022-02-01 MED ORDER — POTASSIUM CHLORIDE 10 MEQ/100ML IV SOLN: 10.0000 meq | INTRAVENOUS | Status: DC

## 2022-02-01 MED ORDER — TREPROSTINIL 100 MG/20ML IJ SOLN
81.0000 ng/kg/min | INTRAVENOUS | Status: DC
  Filled 2022-02-01 (×4): qty 7

## 2022-02-01 MED ORDER — SODIUM CHLORIDE 0.9 % IV SOLN: 2.0000 g | INTRAVENOUS | Status: DC

## 2022-02-01 MED ORDER — TREPROSTINIL 100 MG/20ML IJ SOLN
81.0000 ng/kg/min | INTRAVENOUS | Status: DC
  Filled 2022-02-01: qty 7

## 2022-02-01 MED ORDER — CHLORHEXIDINE GLUCONATE CLOTH 2 % EX PADS
6.0000 | MEDICATED_PAD | Freq: Every day | CUTANEOUS | Status: DC
  Administered 2022-02-02 – 2022-02-10 (×8): 6 via TOPICAL

## 2022-02-01 MED ORDER — SODIUM CHLORIDE 0.9 % IV SOLN: 500.0000 mg | INTRAVENOUS | Status: DC

## 2022-02-01 MED ORDER — SODIUM CHLORIDE 0.9% FLUSH: 10.0000 mL | INTRAVENOUS | Status: DC | PRN

## 2022-02-01 MED ORDER — PHENOL 1.4 % MT LIQD
1.0000 | OROMUCOSAL | Status: DC | PRN
  Administered 2022-02-01 – 2022-02-02 (×3): 1 via OROMUCOSAL
  Filled 2022-02-01: qty 177

## 2022-02-01 MED ORDER — SODIUM CHLORIDE 0.9 % IV SOLN: INTRAVENOUS | Status: DC

## 2022-02-01 MED ORDER — CHLORHEXIDINE GLUCONATE CLOTH 2 % EX PADS
6.0000 | MEDICATED_PAD | Freq: Every day | CUTANEOUS | Status: DC
  Administered 2022-02-01 – 2022-02-05 (×3): 6 via TOPICAL

## 2022-02-01 MED ORDER — PERFLUTREN LIPID MICROSPHERE
1.0000 mL | INTRAVENOUS | Status: AC | PRN
  Administered 2022-02-01: 2 mL via INTRAVENOUS

## 2022-02-01 MED ORDER — FUROSEMIDE 10 MG/ML IJ SOLN
80.0000 mg | Freq: Two times a day (BID) | INTRAMUSCULAR | Status: DC
  Administered 2022-02-01 – 2022-02-08 (×15): 80 mg via INTRAVENOUS
  Filled 2022-02-01 (×16): qty 8

## 2022-02-01 MED ORDER — HEPARIN (PORCINE) 25000 UT/250ML-% IV SOLN
2350.0000 [IU]/h | INTRAVENOUS | Status: DC
  Administered 2022-02-01: 1350 [IU]/h via INTRAVENOUS
  Administered 2022-02-01: 1100 [IU]/h via INTRAVENOUS
  Administered 2022-02-02: 1900 [IU]/h via INTRAVENOUS
  Administered 2022-02-03 – 2022-02-09 (×14): 2350 [IU]/h via INTRAVENOUS
  Filled 2022-02-01 (×17): qty 250

## 2022-02-01 MED ORDER — GERHARDT'S BUTT CREAM
TOPICAL_CREAM | CUTANEOUS | Status: DC | PRN
  Administered 2022-02-08: 1 via TOPICAL
  Filled 2022-02-01 (×4): qty 1

## 2022-02-01 MED ORDER — SODIUM CHLORIDE 0.9% FLUSH
10.0000 mL | Freq: Two times a day (BID) | INTRAVENOUS | Status: DC
  Administered 2022-02-01 – 2022-02-10 (×18): 10 mL

## 2022-02-01 MED ORDER — TREPROSTINIL 100 MG/20ML IJ SOLN
81.0000 ng/kg/min | INTRAVENOUS | Status: DC
  Administered 2022-02-01 – 2022-02-08 (×4): 81 ng/kg/min via INTRAVENOUS
  Filled 2022-02-01 (×10): qty 7

## 2022-02-01 MED ORDER — ONDANSETRON HCL 4 MG/2ML IJ SOLN
4.0000 mg | Freq: Four times a day (QID) | INTRAMUSCULAR | Status: DC | PRN
  Administered 2022-02-01 – 2022-02-08 (×5): 4 mg via INTRAVENOUS
  Filled 2022-02-01 (×5): qty 2

## 2022-02-01 NOTE — Progress Notes (Addendum)
Advanced Heart Failure Rounding Note  PCP-Cardiologist: Dr. Haroldine Laws    Subjective:    Remains on BiPAP.   Sildenafil and treprostinil ordered.   On abx for suspected CAP, PCT 2.02. WBC 24K   800 UOP + 2 unmeasured voids after IV Lasix. K 3.2    Objective:   Weight Range: 116.4 kg Body mass index is 45.46 kg/m.   Vital Signs:   Temp:  [96.9 F (36.1 C)-98.7 F (37.1 C)] 96.9 F (36.1 C) (01/29 0757) Pulse Rate:  [81-102] 86 (01/29 0800) Resp:  [15-45] 27 (01/29 0800) BP: (98-172)/(50-157) 103/61 (01/29 0800) SpO2:  [85 %-95 %] 92 % (01/29 0814) FiO2 (%):  [50 %-100 %] 100 % (01/29 0717) Weight:  [116.4 kg-117.9 kg] 116.4 kg (01/29 0400) Last BM Date : 01/04/2022  Weight change: Filed Weights   01/30/2022 1928 02/01/22 0030 02/01/22 0400  Weight: 117.9 kg 116.4 kg 116.4 kg    Intake/Output:   Intake/Output Summary (Last 24 hours) at 02/01/2022 0919 Last data filed at 02/01/2022 0800 Gross per 24 hour  Intake 1117.17 ml  Output 875 ml  Net 242.17 ml      Physical Exam    General:  chronically ill appearing, on BiPAP. Obese  HEENT: Normal Neck: Supple. Thick neck, JVD not well visualized. Carotids 2+ bilat; no bruits. No lymphadenopathy or thyromegaly appreciated. Cor: PMI nondisplaced. Regular rate & rhythm. No rubs, gallops or murmurs. Lungs: Clear Abdomen: obese, soft, nontender, nondistended. No hepatosplenomegaly. No bruits or masses. Good bowel sounds. Extremities: No cyanosis, clubbing, rash, edema, chronic venous stasis dermatitis  Neuro: Alert & orientedx3, cranial nerves grossly intact. moves all 4 extremities w/o difficulty. Affect pleasant   Telemetry   NSR 90s   EKG    N/A   Labs    CBC Recent Labs    01/12/2022 1940 01/27/2022 1950 02/01/22 0241 02/01/22 0429  WBC 25.4*  --  24.6*  --   NEUTROABS 22.6*  --   --   --   HGB 14.1   < > 13.8 14.3  HCT 41.3   < > 39.8 42.0  MCV 84.1  --  84.0  --   PLT 186  --  164  --    < > =  values in this interval not displayed.   Basic Metabolic Panel Recent Labs    01/21/2022 1940 01/18/2022 1950 01/22/2022 2135 01/28/2022 2140 02/01/22 0241 02/01/22 0429  NA 128*   < >  --    < > 130* 131*  K 2.5*   < >  --    < > 2.8* 3.2*  CL 87*  --   --   --  90*  --   CO2 25  --   --   --  28  --   GLUCOSE 144*  --   --   --  180*  --   BUN 36*  --   --   --  35*  --   CREATININE 1.03*  --   --   --  1.01*  --   CALCIUM 8.6*  --   --   --  8.2*  --   MG  --   --  1.8  --  2.5*  --    < > = values in this interval not displayed.   Liver Function Tests Recent Labs    01/07/2022 1940  AST 34  ALT 18  ALKPHOS 67  BILITOT 1.3*  PROT 6.3*  ALBUMIN 2.8*   No results for input(s): "LIPASE", "AMYLASE" in the last 72 hours. Cardiac Enzymes No results for input(s): "CKTOTAL", "CKMB", "CKMBINDEX", "TROPONINI" in the last 72 hours.  BNP: BNP (last 3 results) Recent Labs    07/02/21 2136 01/16/2022 1940  BNP 29.1 449.8*    ProBNP (last 3 results) No results for input(s): "PROBNP" in the last 8760 hours.   D-Dimer Recent Labs    01/18/2022 1940  DDIMER 2.68*   Hemoglobin A1C Recent Labs    02/01/22 0241  HGBA1C 6.1*   Fasting Lipid Panel No results for input(s): "CHOL", "HDL", "LDLCALC", "TRIG", "CHOLHDL", "LDLDIRECT" in the last 72 hours. Thyroid Function Tests No results for input(s): "TSH", "T4TOTAL", "T3FREE", "THYROIDAB" in the last 72 hours.  Invalid input(s): "FREET3"  Other results:   Imaging    [place] EKG SITE RITE  Result Date: 02/01/2022 If Site Rite image not attached, placement could not be confirmed due to current cardiac rhythm.  DG Abd 1 View  Result Date: 01/09/2022 CLINICAL DATA:  Abdominal distension EXAM: ABDOMEN - 1 VIEW COMPARISON:  02/12/2012 FINDINGS: Scattered large and small bowel gas is noted. No obstructive changes are seen. No free air is noted. No abnormal mass or abnormal calcifications are seen. Bony structures are within normal  limits. IMPRESSION: No acute abnormality noted. Electronically Signed   By: Inez Catalina M.D.   On: 02/03/2022 23:51   DG Chest Portable 1 View  Result Date: 01/08/2022 CLINICAL DATA:  Shortness of breath EXAM: PORTABLE CHEST 1 VIEW COMPARISON:  12/25/2021 FINDINGS: Mild right basilar opacity, likely atelectasis. Left lung is essentially clear. No pleural effusion or pneumothorax. Mild cardiomegaly. Left IJ venous catheter terminating in the right atrium. IMPRESSION: Mild right basilar opacity, likely atelectasis. Electronically Signed   By: Julian Hy M.D.   On: 01/30/2022 19:53     Medications:     Scheduled Medications:  Chlorhexidine Gluconate Cloth  6 each Topical Daily   Chlorhexidine Gluconate Cloth  6 each Topical Daily   insulin aspart  0-9 Units Subcutaneous Q4H   pantoprazole  20 mg Oral BID   pravastatin  20 mg Oral Daily   sildenafil  20 mg Oral TID   sodium chloride flush  10-40 mL Intracatheter Q12H    Infusions:  sodium chloride     azithromycin (ZITHROMAX) 500 mg in sodium chloride 0.9 % 250 mL IVPB     cefTRIAXone (ROCEPHIN)  IV     heparin 1,100 Units/hr (02/01/22 0800)   treprostinil (REMODULIN) 35,000,000 ng in sodium chloride 0.9 % 100 mL (350,000 ng/mL) infusion      PRN Medications: acetaminophen, docusate sodium, ipratropium-albuterol, ondansetron (ZOFRAN) IV, phenol, polyethylene glycol, sodium chloride flush    Patient Profile   [name] is a 62 y.o. female with a hx of morbid obesity, cognitive impairment, G1WE, chronic diastolic HF, pAF (Xarelto), severe pulmonary HTN (on combination therapy with treprostinil and sildenafil) who is being seen today for the evaluation of chronic HF and PH therapies in setting of acute hypoxic respiratory failure requiring CICU admission.   Assessment/Plan   1. Acute on Chronic Hypoxic Respiratory Failure - multifactorial , underlying severe PH, COPD, OHS/OSA, chronic diastolic heart failure  on home  O2, exacerbated by acute CAP  - abx and BiPAP per CCM - continue IV Lasix  - continue PH regimen   2. PAH  - Group I, II, III - follows with Dr. Gilles Chiquito in [facility]  - on combination  therapy with remodulin and sildenafil  - continue sildenafil + treprostinil   3. Acute on Chronic Diastolic Heart Failure w/ Prominent RV Dysfunction  - Last echo 2021: EF 65-70%, RV mod reduced in setting of PH  - repeat echo pending  - volume assessment difficult given body habitus. Place PICC to allow for CVP monitoring to guide diuresis  - Low threshold for repeat RHC if no improvement with initial interventions, treatment of infection as above   4. CAP - PCP 2.02  - abx per CCM  - on azithro + ceftriaxone   5. PAF - NSR currently  - on Xarelto PTA - continue heparin gtt for now    Length of Stay: [place], PA-C  02/01/2022, 9:19 AM  Advanced Heart Failure Team Pager [phone] (M-F; 7a - 5p)  Please contact [facility] for night-coverage after hours (5p -7a ) and weekends on [email or web address]  Patient seen with PA, agree with the above note.   She is on Bipap this morning.  She has been started on ceftriaxone/azithromycin for RLL PNA.   General: NAD Neck: JVP difficult with thick neck but suspect elevated, no thyromegaly or thyroid nodule.  Lungs: Occasional rhonchi CV: Nondisplaced PMI.  Heart regular S1/S2, no S3/S4, no murmur.  Trace ankle edema.  Abdomen: Soft, nontender, no hepatosplenomegaly, no distention.  Skin: Intact without lesions or rashes.  Neurologic: Alert and oriented x 3.  Psych: Normal affect. Extremities: No clubbing or cyanosis.  HEENT: Normal.   1. Acute on chronic hypoxemic respiratory failure: Baseline OHS/OSA on 4L home oxygen. Currently on Bipap.  She is being treating for community-acquired PNA on a baseline of pulmonary hypertension, OHS/OSA, COPD as well as RV failure.  - Continue abx per CCM.  - Lasix 80 mg IV bid.  -  Continues on home IV treprostinil and sildenafil.  2. Pulmonary hypertension: Suspect mixed group 1, 2, and 3 (OHS/OSA, COPD).   [facility] is [facility] at [facility].  - Continue home IV treprostinil.  - Continue sildenafil 20 tid.  3. RV failure: Echo in 6/23 with EF > 55%, moderate RV dysfunction.  Suspect volume overload on exam though difficult exam.  - Set up CVP monitoring.  - Lasix 80 mg IV bid for now and replace K.  4. PNA: Suspect community-acquired PNA with PCT 2.02 and WBCs 24.6.  RLL infiltrate on CXR.  - Continue ceftriaxone/azithromycin per CCM.  5. Atrial fibrillation: Paroxysmal, on Xarelto at home.  She is in NSR.  - Continue heparin gtt for now.  6. Elevated Troponin: HS-TnI 371 => 299.  Suspect demand ischemia from hypoxemia and volume overload.  HS-TnI in 1000s range in 2021, cath at that time showed minimal CAD.   CRITICAL CARE Performed by: Loralie Champagne  Total critical care time: 35 minutes  Critical care time was exclusive of separately billable procedures and treating other patients.  Critical care was necessary to treat or prevent imminent or life-threatening deterioration.  Critical care was time spent personally by me on the following activities: development of treatment plan with patient and/or surrogate as well as nursing, discussions with consultants, evaluation of patient's response to treatment, examination of patient, obtaining history from patient or surrogate, ordering and performing treatments and interventions, ordering and review of laboratory studies, ordering and review of radiographic studies, pulse oximetry and re-evaluation of patient's condition.  Loralie Champagne 02/01/2022 10:12 AM

## 2022-02-01 NOTE — Progress Notes (Signed)
VASCULAR LAB    Bilateral lower extremity venous duplex has been performed.  See CV proc for preliminary results.   Asael Pann, RVT 02/01/2022, 10:30 AM

## 2022-02-01 NOTE — Progress Notes (Signed)
Pharmacy Monitoring   Called [facility] specialty pharmacy.  Verified dosing weight (120 kg) and dose (81 ng/kg/min). Verified concentration is 350,000 ng/mL (uses 7 vials for 5 mcg/mL strength).   Given need for line holiday given bacteremia, will change to Alaris pump for administration while infusing peripheral. Will change to back to home CADD pump once central access obtained.   Antonietta Jewel, PharmD, BCCCP Clinical Pharmacist  Phone: [phone] 02/01/2022 3:14 PM  Please check AMION for all [place] phone numbers After 10:00 PM, call [place] [phone]

## 2022-02-01 NOTE — Progress Notes (Signed)
[facility] for Heparin (Xarelto on hold) Indication: atrial fibrillation  Allergies  Allergen Reactions   Aspirin Swelling    REACTION: airway swelling   Codeine Other (See Comments)    REACTION: tingling in lips and hard breathing - had reaction at dentist - states "I can't take certain kinds of codeine" - happened maybe 10 yr ago   Lisinopril Cough   Sulfonamide Derivatives Swelling    REACTION: airway swelling   Latex Rash   Patient Measurements: Height: '5\' 3"'$  (160 cm) Weight: 116.4 kg (256 lb 9.9 oz) IBW/kg (Calculated) : 52.4  Vital Signs: Temp: 97 F (36.1 C) (01/29 1159) Temp Source: Axillary (01/29 1159) BP: 114/89 (01/29 1200) Pulse Rate: 83 (01/29 1200)  Labs: Recent Labs    02/03/2022 1940 01/16/2022 1950 01/28/2022 2135 01/22/2022 2140 02/01/22 0241 02/01/22 0429 02/01/22 0827 02/01/22 0920 02/01/22 1152  HGB 14.1   < >  --  15.0 13.8 14.3  --   --   --   HCT 41.3   < >  --  44.0 39.8 42.0  --   --   --   PLT 186  --   --   --  164  --   --   --   --   HEPARINUNFRC  --   --   --   --   --   --   --   --  <0.10*  CREATININE 1.03*  --   --   --  1.01*  --  1.09*  --   --   TROPONINIHS 371*  --  373*  --  299*  --   --  188*  --    < > = values in this interval not displayed.     Estimated Creatinine Clearance: 67.6 mL/min (A) (by C-G formula based on SCr of 1.09 mg/dL (H)).   Medical History: Past Medical History:  Diagnosis Date   Acute on chronic diastolic (congestive) heart failure ([place]) 06/25/2013   COPD (chronic obstructive pulmonary disease) (HCC)    Cor pulmonale (HCC)    PA Peak pressure 7mHg   Depression    Diabetes mellitus    well controlled on metformin   External hemorrhoid 01/11/2020   GERD (gastroesophageal reflux disease)    H/O mental retardation    Hyperlipidemia    Hypertension    OSA (obstructive sleep apnea)    CPAP   Pulmonary hypertension (HCC)    Restrictive lung disease    PFTs  06/2012 (FVC 54% predicted and FEV1 68% predicted w minimal bronchodilator response).   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 ulcer (HCC)    chornic, ?followed up at wound care center, multiple courses of antibiotics in past for cellulitis, on lasix    Assessment: 62y/o F with HF and PAH presents to the ED with shortness breath. She is on Xarelto PTA for afib. Holding Xarelto and starting Heparin. The patient states she hasn't taken her Xarelto in over one week.   Heparin level came back undetectable on heparin at 1100 units/hr. Confirmed drawn appropriately, CBC stable on last check. No s/sx of bleeding or infusion issues per nursing.  Goal of Therapy:  Heparin level 0.3-0.7 units/ml Monitor platelets by anticoagulation protocol: Yes   Plan:  Increase heparin infusion to 1350 units/hr Heparin level in 8 hours Monitor for bleeding  [name]  PharmD, [place] Clinical Pharmacist  Phone: [phone] 02/01/2022 1:14 PM  Please check AMION for all [place] phone numbers After 10:00 PM, call [place] [phone]

## 2022-02-01 NOTE — Progress Notes (Signed)
NAME:  [name], MRN:  [id number], DOB:  [birth year], LOS: 1 ADMISSION DATE:  01/24/2022, CONSULTATION DATE:  1/28 REFERRING MD:  Dr. Regenia Skeeter, CHIEF COMPLAINT:  Acute hypoxic resp failure; chf vs. Copd exacerbation   History of Present Illness:  Patient is a 62 year old female with pertinent PMH chronic diastolic HF, cor pulmonale, severe pulmonary hypertension (on Remodulin and sildenafil), A-fib on Xarelto, OSA on CPAP presents to [facility] ED on 5/28 with SOB.  Patient states her breathing has worsened over the past few days.  States she is not very compliant with her medications at home.  States her breathing has gotten a lot more swollen over the past few days making it harder on her breathing.  On 1/28 her breathing was much worse and called EMS.  EMS found patient with sats 72% on room air and initially placed on O2 but required CPAP.  Upon arrival to [facility] ED on 1/28, patient in acute respiratory distress on BiPAP.  Initial BP 99/50.  RR 30s.  Afebrile but wbc 25.4 EKG with no signs of ischemia.  Given 80 IV Lasix. CXR w/ R basilar opacity. Given rocephin/azithro for possible cap.  Cultures obtained.  HF notified by ED physician for consult. PCCM consulted for ICU admission.  Pertinent ED labs: COVID/flu/RSV negative, NA 128, potassium 2.5, mag 1.8, creat 1.03, BNP 449,Troponin 371 troponin 371 then 373, VBG 7.46, 39, 50, 28.  Pertinent  Medical History   Past Medical History:  Diagnosis Date   Acute on chronic diastolic (congestive) heart failure ([place]) 06/25/2013   COPD (chronic obstructive pulmonary disease) (HCC)    Cor pulmonale (HCC)    PA Peak pressure 58mHg   Depression    Diabetes mellitus    well controlled on metformin   External hemorrhoid 01/11/2020   GERD (gastroesophageal reflux disease)    H/O mental retardation    Hyperlipidemia    Hypertension    OSA (obstructive sleep apnea)    CPAP   Pulmonary hypertension (HCC)    Restrictive lung disease    PFTs 06/2012  (FVC 54% predicted and FEV1 68% predicted w minimal bronchodilator response).   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 ulcer ([place]    chornic, ?followed up at wound care center, multiple courses of antibiotics in past for cellulitis, on lasix     Significant Hospital Events: Including procedures, antibiotic start and stop dates in addition to other pertinent events   1/28 admitted likely chf exacerbation on bipap 1/29 Remains on BiPAP, trialed HFNC but desaturated  Interim History / Subjective:  On BiPAP at 100%. Tolerating fine. She feels that it does help some.  Objective   Blood pressure 110/61, pulse 88, temperature (!) 96.9 F (36.1 C), temperature source Axillary, resp. rate (!) 35, height '5\' 3"'$  (1.6 m), weight 116.4 kg, last menstrual period 02/19/2011, SpO2 92 %.    FiO2 (%):  [50 %-100 %] 100 %   Intake/Output Summary (Last 24 hours) at 02/01/2022 0828 Last data filed at 02/01/2022 0500 Gross per 24 hour  Intake 825.79 ml  Output 875 ml  Net -49.21 ml    Filed Weights   01/08/2022 1928 02/01/22 0030 02/01/22 0400  Weight: 117.9 kg 116.4 kg 116.4 kg    Examination: General: Chronically ill appearing female, resting in bed, in NAD. Neuro: A&O x 3, no deficits. HEENT: [place]/AT. Sclerae anicteric. BiPAP in place. Cardiovascular:  RRR, no M/R/G.  Lungs: Respirations even and unlabored.  CTA bilaterally, No W/R/R. Abdomen: Obese. BS x 4, soft, NT/ND.  Musculoskeletal: No gross deformities, 2+ edema bilaterally with venous stasis changes. Skin: LLE in ACE wrap (rehab for lymphedema per pt). Skin warm, no rashes.   Assessment & Plan:   Acute on chronic respiratory failure w/ hypoxia (On 4L O2) - Likely CHF exacerbation (patient states non compliant w/ medications) + Possible CAP. Hx OSA on home CPAP. Probable OHS. - Continue BiPAP. - Transition to [facility] as able. - Continue Lasix  as able, goal neg balance. - Continue rocephin/azithro. - Follow cultures. - F/u on LE duplex.  Acute on chronic diastolic chf/cor pulmonale  Pulmonary HTN Group 1, II, III - Followed by Dr. Gilles Chiquito at [facility]. Hx Afib on xarelto, HLD. - HF team consulted; appreciate recs. - Check CVP's and continue Lasix as able for goal neg balance. - Resume home Spironolactone, Torsemide once can come off BiPAP and able to take PO safely. - F/u on echo. - Continue home Redmodulin, Sildenafil. - Continue Heparin gtt. - Consider palliative care input.  Elevated trop - presumed demand. - repeat.  Abdominal distention - likely ascites from hypervolemia. - Continue diuresis as able.  Hypokalemia - s/p repletion. Hyponatremia: likely hypervolemia AKI. - F/u on repeat BMP this AM. - Anticipate ongoing hypokalemia given need for ongoing diuresis. - Continue diuresis as able. - Follow BMP.  Chronic venous insufficiency / venous stasis. - F/u on LE Duplex. - Unna boots.  Hx DM. - SSI.  Hx GERD. - Continue PPI.    Best Practice (right click and "Reselect all SmartList Selections" daily)   Diet/type: NPO DVT prophylaxis: systemic heparin GI prophylaxis: PPI Lines: N/A Foley:  N/A Code Status:  full code Last date of multidisciplinary goals of care discussion [1/28 updated patient at bedside. Attempted to update son Legrand Como over phone but no answer]  Critical care time: 30 min.    Montey Hora, [place] [facility] For pager details, please see AMION or use Epic chat  After 1900, please call [facility] for cross coverage needs 02/01/2022, 8:47 AM

## 2022-02-01 NOTE — Consult Note (Signed)
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  MSSA pneumonia Diastolic HF, Pulmonary HTN on  treprostinil and sildenafil  COPD OSA/OHS Morbid obesity PAF  Plan:  Narrow to cefazolin Repeat blood cultures TTE [facility] will need a LINE HOLIDAY when possible and blood cultures repeated AFTER line removed Monitor for metastatic sites of infection I am not overly enthusiastic about the idea of a transesophageal echocardiogram in this patient would likely instead treat her presumptively for endocarditis rather than has of the sedation involved with a TEE  Principal Problem:   MSSA bacteremia Active Problems:   Acute respiratory failure with hypoxia ([place])   Hyponatremia   Hypokalemia   Community acquired pneumonia   Acute on chronic right-sided congestive heart failure ([place])   Scheduled Meds:  Chlorhexidine Gluconate Cloth  6 each Topical Daily   Chlorhexidine Gluconate Cloth  6 each Topical Daily   furosemide  80 mg Intravenous BID   insulin aspart  0-9 Units Subcutaneous Q4H   pantoprazole  20 mg Oral BID   pravastatin  20 mg Oral Daily   sildenafil  20 mg Oral TID   sodium chloride flush  10-40 mL Intracatheter Q12H   Continuous Infusions:  sodium chloride Stopped (02/01/22 1154)    ceFAZolin (ANCEF) IV Stopped (02/01/22 1231)   heparin 1,350 Units/hr (02/01/22 1500)   treprostinil (REMODULIN) 35,000,000 ng in pH 12 sterile diluent 100 mL (350,000 ng/mL) infusion 81 ng/kg/min (02/01/22 1602)   treprostinil (REMODULIN) 35,000,000 ng in sodium chloride 0.9 % 100 mL (350,000 ng/mL) infusion     PRN Meds:.acetaminophen, docusate sodium, Gerhardt's butt cream, ipratropium-albuterol, ondansetron (ZOFRAN) IV, phenol, polyethylene glycol, sodium chloride flush  HPI: [name] is a 62 y.o. female with multiple medical problems including morbid obesity obstructive sleep apnea obesity hypoventilation syndrome paroxysmal atrial fibrillation diastolic heart failure severe pulmonary hypertension on treprostinil IV infusion via central line and sildenafil presented with worsening shortness of breath not responsive to home medications.  She was hypoxic into the 70s in the ER and required CPAP.  Blood cultures were taken.  Chest x-ray showed some infiltrates at the bases and she was started on ceftriaxone and azithromycin.  In the interim the blood cultures returned positive for methicillin sensitive Staphylococcus aureus.  I suspect her central line is likely the source of this bacteremia though it is not the source is undoubtably a site that is been seeded.  She will need to have the central line removed and a central line "holiday to accomplish clearance of her bacteremia.  Will follow-up on the results of her 2D echocardiogram repeat blood cultures now and also repeat blood cultures after her central line is removed when this is possible.  Will need to monitor for metastatic sites of infection.  I would prefer to err on the side of treating presumptively for endocarditis in terms of duration of antibiotics rather than has been a TEE.  I spent 84 minutes with the patient including than 50% of the time in face to face counseling of the patient regarding her MSSA bacteremia likely due to central line, potential MSSA pneumonia severe pulmonary hypertension sleep apnea atrial fibrillation personally reviewing chest x-ray along with review of medical records in preparation  for the visit and during the visit and in coordination of her care.    Review of Systems: Review of Systems  Unable to perform ROS: Critical illness    Past Medical History:  Diagnosis Date   Acute on chronic diastolic (congestive) heart failure ([place]) 06/25/2013   COPD (chronic obstructive pulmonary  disease) (HCC)    Cor pulmonale (HCC)    PA Peak pressure 80mHg   Depression    Diabetes mellitus    well controlled on metformin   External hemorrhoid 01/11/2020   GERD (gastroesophageal reflux disease)    H/O mental retardation    Hyperlipidemia    Hypertension    OSA (obstructive sleep apnea)    CPAP   Pulmonary hypertension (HCC)    Restrictive lung disease    PFTs 06/2012 (FVC 54% predicted and FEV1 68% predicted w minimal bronchodilator response).   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 ulcer (HCC)    chornic, ?followed up at wound care center, multiple courses of antibiotics in past for cellulitis, on lasix    Social History   Tobacco Use   Smoking status: Never   Smokeless tobacco: Never  Vaping Use   Vaping Use: Never used  Substance Use Topics   Alcohol use: No    Alcohol/week: 0.0 standard drinks of alcohol   Drug use: No    Family History  Problem Relation Age of Onset   Kidney Stones Son    Mental illness Sister    Bipolar disorder Sister    Mental retardation Brother    Hyperlipidemia Mother    Breast cancer Maternal Aunt    Colon cancer Neg Hx    Pancreatic cancer Neg Hx    Esophageal cancer Neg Hx    Allergies  Allergen Reactions   Aspirin Swelling    REACTION: airway swelling   Codeine Other (See Comments)    REACTION: tingling in lips and hard breathing - had reaction at dentist - states "I can't take certain kinds of codeine" - happened maybe 10 yr ago   Lisinopril Cough   Sulfonamide Derivatives Swelling    REACTION: airway swelling   Latex Rash    OBJECTIVE: Blood pressure 110/73, pulse 83, temperature (!) 97 F (36.1 C), temperature source Axillary, resp. rate (!) 25, height '5\' 3"'$  (1.6 m), weight 116.4 kg, last menstrual period 02/19/2011, SpO2 92 %.  Physical Exam Constitutional:      General: She is not in acute distress.     Appearance: Normal appearance. She is obese. She is ill-appearing. She is not diaphoretic.  HENT:     Head: Normocephalic and atraumatic.     Right Ear: Hearing and external ear normal.     Left Ear: Hearing and external ear normal.     Nose: No nasal deformity or rhinorrhea.  Eyes:     General: No scleral icterus.    Conjunctiva/sclera: Conjunctivae normal.     Right eye: Right conjunctiva is not injected.     Left eye: Left conjunctiva is not injected.     Pupils: Pupils are equal, round, and reactive to light.  Neck:     Vascular: No JVD.  Cardiovascular:     Rate and Rhythm: Tachycardia present. Rhythm irregular.     Heart sounds: Normal heart sounds, S1 normal and S2 normal. No murmur heard.    No friction rub. No gallop.  Pulmonary:  Effort: Respiratory distress present.     Breath sounds: Rhonchi present. No wheezing.  Abdominal:     General: Bowel sounds are normal. There is no distension.     Palpations: Abdomen is soft.     Tenderness: There is no abdominal tenderness.  Musculoskeletal:        General: Normal range of motion.     Right shoulder: Normal.     Left shoulder: Normal.     Cervical back: Normal range of motion and neck supple.     Right hip: Normal.     Left hip: Normal.     Right knee: Normal.     Left knee: Normal.  Lymphadenopathy:     Head:     Right side of head: No submandibular, preauricular or posterior auricular adenopathy.     Left side of head: No submandibular, preauricular or posterior auricular adenopathy.     Cervical: No cervical adenopathy.     Right cervical: No superficial or deep cervical adenopathy.    Left cervical: No superficial or deep cervical adenopathy.  Skin:    General: Skin is warm and dry.     Coloration: Skin is not pale.     Findings: No abrasion, bruising, ecchymosis, erythema, lesion or rash.     Nails: There is no clubbing.  Neurological:     General: No focal deficit present.     Mental Status: She is alert  and oriented to person, place, and time.     Sensory: No sensory deficit.     Coordination: Coordination normal.     Gait: Gait normal.  Psychiatric:        Attention and Perception: She is attentive.        Mood and Affect: Mood normal.        Speech: Speech normal.        Behavior: Behavior normal. Behavior is cooperative.        Thought Content: Thought content normal.        Judgment: Judgment normal.    Central line not overtly infected  Lab Results Lab Results  Component Value Date   WBC 24.6 (H) 02/01/2022   HGB 14.3 02/01/2022   HCT 42.0 02/01/2022   MCV 84.0 02/01/2022   PLT 164 02/01/2022    Lab Results  Component Value Date   CREATININE 1.09 (H) 02/01/2022   BUN 37 (H) 02/01/2022   NA 132 (L) 02/01/2022   K 3.5 02/01/2022   CL 91 (L) 02/01/2022   CO2 27 02/01/2022    Lab Results  Component Value Date   ALT 18 01/25/2022   AST 34 01/24/2022   ALKPHOS 67 01/16/2022   BILITOT 1.3 (H) 01/16/2022     Microbiology: Recent Results (from the past 240 hour(s))  Resp panel by RT-PCR (RSV, Flu A&B, Covid) Anterior Nasal Swab     Status: None   Collection Time: 01/26/2022  7:33 PM   Specimen: Anterior Nasal Swab  Result Value Ref Range Status   SARS Coronavirus 2 by RT PCR NEGATIVE NEGATIVE Final    Comment: (NOTE) SARS-CoV-2 target nucleic acids are NOT DETECTED.  The SARS-CoV-2 RNA is generally detectable in upper respiratory specimens during the acute phase of infection. The lowest concentration of SARS-CoV-2 viral copies this assay can detect is 138 copies/mL. A negative result does not preclude SARS-Cov-2 infection and should not be used as the sole basis for treatment or other patient management decisions. A negative result may occur with  improper  specimen collection/handling, submission of specimen other than nasopharyngeal swab, presence of viral mutation(s) within the areas targeted by this assay, and inadequate number of viral copies(<138  copies/mL). A negative result must be combined with clinical observations, patient history, and epidemiological information. The expected result is Negative.  Fact Sheet for Patients:  EntrepreneurPulse.com.au  Fact Sheet for Healthcare Providers:  IncredibleEmployment.be  This test is no t yet approved or cleared by the [place] FDA and  has been authorized for detection and/or diagnosis of SARS-CoV-2 by FDA under an Emergency Use Authorization (EUA). This EUA will remain  in effect (meaning this test can be used) for the duration of the COVID-19 declaration under Section 564(b)(1) of the Act, 21 U.S.C.section 360bbb-3(b)(1), unless the authorization is terminated  or revoked sooner.       Influenza A by PCR NEGATIVE NEGATIVE Final   Influenza B by PCR NEGATIVE NEGATIVE Final    Comment: (NOTE) The Xpert Xpress SARS-CoV-2/FLU/RSV plus assay is intended as an aid in the diagnosis of influenza from Nasopharyngeal swab specimens and should not be used as a sole basis for treatment. Nasal washings and aspirates are unacceptable for Xpert Xpress SARS-CoV-2/FLU/RSV testing.  Fact Sheet for Patients: EntrepreneurPulse.com.au  Fact Sheet for Healthcare Providers: IncredibleEmployment.be  This test is not yet approved or cleared by the [place] FDA and has been authorized for detection and/or diagnosis of SARS-CoV-2 by FDA under an Emergency Use Authorization (EUA). This EUA will remain in effect (meaning this test can be used) for the duration of the COVID-19 declaration under Section 564(b)(1) of the Act, 21 U.S.C. section 360bbb-3(b)(1), unless the authorization is terminated or revoked.     Resp Syncytial Virus by PCR NEGATIVE NEGATIVE Final    Comment: (NOTE) Fact Sheet for Patients: EntrepreneurPulse.com.au  Fact Sheet for Healthcare  Providers: IncredibleEmployment.be  This test is not yet approved or cleared by the [place] FDA and has been authorized for detection and/or diagnosis of SARS-CoV-2 by FDA under an Emergency Use Authorization (EUA). This EUA will remain in effect (meaning this test can be used) for the duration of the COVID-19 declaration under Section 564(b)(1) of the Act, 21 U.S.C. section 360bbb-3(b)(1), unless the authorization is terminated or revoked.  Performed at [facility], [place] [address], [place], [place] [address]   Blood culture (routine x 2)     Status: None (Preliminary result)   Collection Time: 02/01/2022  7:40 PM   Specimen: BLOOD  Result Value Ref Range Status   Specimen Description BLOOD RIGHT ANTECUBITAL  Final   Special Requests   Final    BOTTLES DRAWN AEROBIC AND ANAEROBIC Blood Culture adequate volume   Culture  Setup Time   Final    GRAM POSITIVE COCCI IN CLUSTERS IN BOTH AEROBIC AND ANAEROBIC BOTTLES CRITICAL RESULT CALLED TO, READ BACK BY AND VERIFIED WITH: PHARMD E.MARTIN AT 4081 02/01/2022 BY T.SAAD. Performed at [facility], [place] [address] [place], [place] [address]    Culture GRAM POSITIVE COCCI  Final   Report Status PENDING  Incomplete  Blood Culture ID Panel (Reflexed)     Status: Abnormal   Collection Time: 01/12/2022  7:40 PM  Result Value Ref Range Status   Enterococcus faecalis NOT DETECTED NOT DETECTED Final   Enterococcus Faecium NOT DETECTED NOT DETECTED Final   Listeria monocytogenes NOT DETECTED NOT DETECTED Final   Staphylococcus species DETECTED (A) NOT DETECTED Final    Comment: CRITICAL RESULT CALLED TO, READ BACK BY AND  VERIFIED WITH: PHARMD E.MARTIN AT 1130 02/01/2022 BY T.SAAD.    Staphylococcus aureus (BCID) DETECTED (A) NOT DETECTED Final    Comment: CRITICAL RESULT CALLED TO, READ BACK BY AND VERIFIED WITH: PHARMD E.MARTIN AT 1130 02/01/2022 BY T.SAAD.    Staphylococcus epidermidis NOT DETECTED NOT  DETECTED Final   Staphylococcus lugdunensis NOT DETECTED NOT DETECTED Final   Streptococcus species NOT DETECTED NOT DETECTED Final   Streptococcus agalactiae NOT DETECTED NOT DETECTED Final   Streptococcus pneumoniae NOT DETECTED NOT DETECTED Final   Streptococcus pyogenes NOT DETECTED NOT DETECTED Final   A.calcoaceticus-baumannii NOT DETECTED NOT DETECTED Final   Bacteroides fragilis NOT DETECTED NOT DETECTED Final   Enterobacterales NOT DETECTED NOT DETECTED Final   Enterobacter cloacae complex NOT DETECTED NOT DETECTED Final   Escherichia coli NOT DETECTED NOT DETECTED Final   Klebsiella aerogenes NOT DETECTED NOT DETECTED Final   Klebsiella oxytoca NOT DETECTED NOT DETECTED Final   Klebsiella pneumoniae NOT DETECTED NOT DETECTED Final   Proteus species NOT DETECTED NOT DETECTED Final   Salmonella species NOT DETECTED NOT DETECTED Final   Serratia marcescens NOT DETECTED NOT DETECTED Final   Haemophilus influenzae NOT DETECTED NOT DETECTED Final   Neisseria meningitidis NOT DETECTED NOT DETECTED Final   Pseudomonas aeruginosa NOT DETECTED NOT DETECTED Final   Stenotrophomonas maltophilia NOT DETECTED NOT DETECTED Final   Candida albicans NOT DETECTED NOT DETECTED Final   Candida auris NOT DETECTED NOT DETECTED Final   Candida glabrata NOT DETECTED NOT DETECTED Final   Candida krusei NOT DETECTED NOT DETECTED Final   Candida parapsilosis NOT DETECTED NOT DETECTED Final   Candida tropicalis NOT DETECTED NOT DETECTED Final   Cryptococcus neoformans/gattii NOT DETECTED NOT DETECTED Final   Meth resistant mecA/C and MREJ NOT DETECTED NOT DETECTED Final    Comment: Performed at [facility], [address], [place], [place] [address]  Blood culture (routine x 2)     Status: None (Preliminary result)   Collection Time: 01/09/2022  8:41 PM   Specimen: BLOOD RIGHT HAND  Result Value Ref Range Status   Specimen Description BLOOD RIGHT HAND  Final   Special Requests   Final     BOTTLES DRAWN AEROBIC AND ANAEROBIC Blood Culture adequate volume   Culture   Final    NO GROWTH < 12 HOURS Performed at [facility], [place] [address] [place], [place] [address]    Report Status PENDING  Incomplete  MRSA Next Gen by PCR, Nasal     Status: None   Collection Time: 02/01/22 12:38 AM   Specimen: Nasal Mucosa; Nasal Swab  Result Value Ref Range Status   MRSA by PCR Next Gen NOT DETECTED NOT DETECTED Final    Comment: (NOTE) The GeneXpert MRSA Assay (FDA approved for NASAL specimens only), is one component of a comprehensive MRSA colonization surveillance program. It is not intended to diagnose MRSA infection nor to guide or monitor treatment for MRSA infections. Test performance is not FDA approved in patients less than 30 years old. Performed at [facility], [place] [address] [place], [place] [address]     Alcide Evener, [facility] [place] [facility] [phone] pager  02/01/2022, 4:25 PM

## 2022-02-01 NOTE — Progress Notes (Signed)
[place] for Heparin (Xarelto on hold) Indication: atrial fibrillation  Allergies  Allergen Reactions   Aspirin Swelling    REACTION: airway swelling   Codeine Other (See Comments)    REACTION: tingling in lips and hard breathing - had reaction at dentist - states "I can't take certain kinds of codeine" - happened maybe 10 yr ago   Lisinopril Cough   Sulfonamide Derivatives Swelling    REACTION: airway swelling   Latex Rash   Patient Measurements: Height: '5\' 3"'$  (160 cm) Weight: 116.4 kg (256 lb 9.9 oz) IBW/kg (Calculated) : 52.4 Heparin dosing wt: 81 kg  Vital Signs: Temp: 97 F (36.1 C) (01/29 1553) Temp Source: Axillary (01/29 1553) BP: 123/68 (01/29 1900) Pulse Rate: 81 (01/29 1900)  Labs: Recent Labs    01/11/2022 1940 01/09/2022 1950 01/29/2022 2140 02/01/22 0241 02/01/22 0429 02/01/22 0827 02/01/22 0920 02/01/22 1152 02/01/22 1717 02/01/22 2046  HGB 14.1   < > 15.0 13.8 14.3  --   --   --   --   --   HCT 41.3   < > 44.0 39.8 42.0  --   --   --   --   --   PLT 186  --   --  164  --   --   --   --   --   --   HEPARINUNFRC  --   --   --   --   --   --   --  <0.10*  --  <0.10*  CREATININE 1.03*  --   --  1.01*  --  1.09*  --   --  0.91  --   TROPONINIHS 371*   < >  --  299*  --  204* 188*  --   --   --    < > = values in this interval not displayed.     Estimated Creatinine Clearance: 81 mL/min (by C-G formula based on SCr of 0.91 mg/dL).   Medical History: Past Medical History:  Diagnosis Date   Acute on chronic diastolic (congestive) heart failure ([place]) 06/25/2013   COPD (chronic obstructive pulmonary disease) (HCC)    Cor pulmonale (HCC)    PA Peak pressure 27mHg   Depression    Diabetes mellitus    well controlled on metformin   External hemorrhoid 01/11/2020   GERD (gastroesophageal reflux disease)    H/O mental retardation    Hyperlipidemia    Hypertension    OSA (obstructive sleep apnea)    CPAP    Pulmonary hypertension (HCC)    Restrictive lung disease    PFTs 06/2012 (FVC 54% predicted and FEV1 68% predicted w minimal bronchodilator response).   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 ulcer (HCC)    chornic, ?followed up at wound care center, multiple courses of antibiotics in past for cellulitis, on lasix    Assessment: 62y/o F with HF and PAH presents to the ED with shortness breath. She is on Xarelto PTA for afib. Holding Xarelto and starting Heparin. The patient states she hasn't taken her Xarelto in over one week.   Heparin level came back undetectable on heparin at 1350 units/hr. No issues with line or bleeding reported per RN.  Goal of Therapy:  Heparin level 0.3-0.7 units/ml Monitor platelets by anticoagulation protocol: Yes   Plan:  Increase heparin infusion to 1650  units/hr Heparin level in 6 hours  Sherlon Handing, PharmD, BCPS Please see amion for complete clinical pharmacist phone list 02/01/2022 9:40 PM

## 2022-02-01 NOTE — Progress Notes (Signed)
ANTICOAGULATION CONSULT NOTE - Initial Consult  Pharmacy Consult for Heparin (Xarelto on hold) Indication: atrial fibrillation  Allergies  Allergen Reactions   Aspirin Swelling    REACTION: airway swelling   Codeine Other (See Comments)    REACTION: tingling in lips and hard breathing - had reaction at dentist - states "I can't take certain kinds of codeine" - happened maybe 10 yr ago   Lisinopril Cough   Sulfonamide Derivatives Swelling    REACTION: airway swelling   Latex Rash   Patient Measurements: Height: '5\' 3"'$  (160 cm) Weight: 117.9 kg (259 lb 14.8 oz) IBW/kg (Calculated) : 52.4  Vital Signs: Temp: 98.7 F (37.1 C) (01/28 2006) Temp Source: Axillary (01/28 2006) BP: 107/72 (01/29 0000) Pulse Rate: 91 (01/29 0000)  Labs: Recent Labs    01/17/2022 1940 01/23/2022 1950 01/05/2022 2135 02/03/2022 2140  HGB 14.1 15.3*  --  15.0  HCT 41.3 45.0  --  44.0  PLT 186  --   --   --   CREATININE 1.03*  --   --   --   TROPONINIHS 371*  --  373*  --     Estimated Creatinine Clearance: 72.1 mL/min (A) (by C-G formula based on SCr of 1.03 mg/dL (H)).   Medical History: Past Medical History:  Diagnosis Date   Acute on chronic diastolic (congestive) heart failure ([place]) 06/25/2013   COPD (chronic obstructive pulmonary disease) (HCC)    Cor pulmonale (HCC)    PA Peak pressure 55mHg   Depression    Diabetes mellitus    well controlled on metformin   External hemorrhoid 01/11/2020   GERD (gastroesophageal reflux disease)    H/O mental retardation    Hyperlipidemia    Hypertension    OSA (obstructive sleep apnea)    CPAP   Pulmonary hypertension (HCC)    Restrictive lung disease    PFTs 06/2012 (FVC 54% predicted and FEV1 68% predicted w minimal bronchodilator response).   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 ulcer (HCC)    chornic, ?followed up at wound care center, multiple  courses of antibiotics in past for cellulitis, on lasix    Assessment: 62y/o F with HF and PAH presents to the ED with shortness breath. She is on Xarelto PTA for afib. Holding Xarelto and starting Heparin. The patient states she hasn't taken her Xarelto in over one week. Will use heparin level only to dose for now, if initial heparin level comes back unusually high, can check aPTT.   Goal of Therapy:  Heparin level 0.3-0.7 units/ml Monitor platelets by anticoagulation protocol: Yes   Plan:  Heparin 3000 units bolus Start heparin drip at 1100 units/hr Heparin level in 8 hours Monitor for bleeding  JNarda Bonds PharmD, BCPS Clinical Pharmacist Phone: [phone]

## 2022-02-01 NOTE — Progress Notes (Signed)
Orthopedic Tech Progress Note Patient Details:  [name] [birth year] [id number]  Ortho Devices Type of Ortho Device: Ace wrap, Unna boot Ortho Device/Splint Location: BLE Ortho Device/Splint Interventions: Ordered, Application, Adjustment   Post Interventions Patient Tolerated: Well Instructions Provided: Care of device  Janit Pagan 02/01/2022, 3:24 PM

## 2022-02-01 NOTE — Progress Notes (Signed)
  Echocardiogram 2D Echocardiogram has been performed.  [name] 02/01/2022, 12:00 PM

## 2022-02-01 NOTE — Progress Notes (Signed)
[facility] Progress Note Patient Name: [name] DOB: 02/10/1960 MRN: [id number]   Date of Service  02/01/2022  HPI/Events of Note  K 2.8, 3.2 on ABG Receiving repletion  eICU Interventions  BMET at 8 AM ordered      Intervention Category Minor Interventions: Electrolytes abnormality - evaluation and management  [name] Rodman Pickle 02/01/2022, 5:02 AM

## 2022-02-01 NOTE — Progress Notes (Signed)
Admission

## 2022-02-01 NOTE — Progress Notes (Signed)
Pt transported to 2H19 on BIPAP. No issues.

## 2022-02-01 NOTE — Consult Note (Signed)
Cardiology Consultation:   Patient ID: MARYCLARE Barker MRN: [id number]; DOB: 07/09/1960  Admit date: 01/20/2022 Date of Consult: 02/01/2022  Primary Care Provider: Christiana Fuchs, DO [facility] Cardiologist: Glori Bickers, MD [facility] Electrophysiologist:  None    Patient Profile:   [name] is a 62 y.o. female with a hx of morbid obesity, cognitive impairment, Q3ES, chronic diastolic HF, pAF (Xarelto), severe pulmonary HTN (on combination therapy with treprostinil and sildenafil) who is being seen today for the evaluation of chronic HF and PH therapies in setting of acute hypoxic respiratory failure requiring MICU admission. Consult is at the request of ED.  History of Present Illness:   [name] is a 62yo female with PMHx chronic diastolic heart failure, pAF (Xarelto), OSA (nightly CPAP), and longstanding history of severe PAH (mixed Group I, II, and III disease, followed by Dr. Gilles Chiquito at [facility] and treated with home O2, IV prostacycline and phosphodiesterase inhibitor) who presents to ED with several days of worsening SOB.  On arrival to [facility] ED, [name] was afebrile, HR 100-110, BP 100/50, and RR 30-35 with O2 saturation low 90s on 50% FiO2. Initial evaluation was notable for the following: - VBG: 7.46/40 on FiO2 50% - CBC: Leukocytosis to 25.4 w/ L shift, H/H 14/41, PLT 186 - BMP with hypokalemia to 2.5, Cr 1.03.  - PCT 2.02 - hsTnT 371-373 - BNP 449 - Lactate 1.1 - COVID, Flu, RSV negative.  - EKG with sinus tachycardia, first degree AVB, R axis deviation, no acute ischemic change.  - CXR with R basilar opacification.  [name] received IV antibiotics with CTZ, azithro in ED. Lasix '80mg'$  IV x1. Admitted to MICU on BiPAP.    Past Medical History:  Diagnosis Date   Acute on chronic diastolic (congestive) heart failure ([place]) 06/25/2013   COPD (chronic obstructive pulmonary disease) (HCC)    Cor pulmonale (HCC)    PA Peak pressure 75mHg    Depression    Diabetes mellitus    well controlled on metformin   External hemorrhoid 01/11/2020   GERD (gastroesophageal reflux disease)    H/O mental retardation    Hyperlipidemia    Hypertension    OSA (obstructive sleep apnea)    CPAP   Pulmonary hypertension (HCC)    Restrictive lung disease    PFTs 06/2012 (FVC 54% predicted and FEV1 68% predicted w minimal bronchodilator response).   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 [place]orn[place], ?followed up at wound care center, multiple courses of antibiotics in past for cellulitis, on lasix    Past Surgical History:  Procedure Laterality Date   CARDIAC CATHETERIZATION N/A 01/13/2016   Procedure: Right Heart Cath;  Surgeon: DJolaine Artist MD;  [place];  Service: Cardiovascular;  Laterality: N/A;   CHOLECYSTECTOMY     COLONOSCOPY WITH PROPOFOL N/A 12/20/2019   Procedure: COLONOSCOPY WITH PROPOFOL;  Surgeon: JMilus Banister MD;  Location: [facility];  Service: Endoscopy;  Laterality: N/A;   IR FLUORO GUIDE CV LINE LEFT  11/16/2019   IR FLUORO GUIDE CV LINE LEFT  06/17/2020   IR FLUORO GUIDE CV LINE LEFT  07/04/2020   IR FLUORO GUIDE CV LINE LEFT  07/03/2021   IR FLUORO GUIDE CV LINE LEFT  12/26/2021   IR FLUORO GUIDE CV LINE RIGHT  05/09/2019   IR FLUORO GUIDE CV  LINE RIGHT  08/12/2019   IR REMOVAL TUN CV CATH W/O FL  05/05/2019   IR US GUIDE VASC ACCESS LEFT  11/16/2019   IR US GUIDE VASC ACCESS LEFT  06/17/2020   IR US GUIDE VASC ACCESS LEFT  07/04/2020   IR US GUIDE VASC ACCESS LEFT  07/03/2021   IR US GUIDE VASC ACCESS LEFT  12/26/2021   IR US GUIDE VASC ACCESS RIGHT  05/09/2019   IR US GUIDE VASC ACCESS RIGHT  08/12/2019   IR US GUIDE VASC ACCESS RIGHT  11/16/2019   IR VENO/JUGULAR RIGHT  11/16/2019   LEFT AND RIGHT HEART CATHETERIZATION WITH CORONARY ANGIOGRAM N/A 02/28/2014   Procedure: LEFT AND RIGHT HEART  CATHETERIZATION WITH CORONARY ANGIOGRAM;  Surgeon: Jolaine Artist, MD;  Location: [facility] LAB;  Service: Cardiovascular;  Laterality: N/A;   RIGHT HEART CATH N/A 06/27/2018   Procedure: RIGHT HEART CATH;  Surgeon: Jolaine Artist, MD;  Location: [place] [facility];  Service: Cardiovascular;  Laterality: N/A;   RIGHT/LEFT HEART CATH AND CORONARY ANGIOGRAPHY N/A 05/14/2019   Procedure: RIGHT/LEFT HEART CATH AND CORONARY ANGIOGRAPHY;  Surgeon: Jolaine Artist, MD;  Location: [place] [facility];  Service: Cardiovascular;  Laterality: N/A;     Home Medications:  Prior to Admission medications   Medication Sig Start Date End Date Taking? Authorizing Provider  ACCU-CHEK AVIVA PLUS test strip USE 1 STRIP TO CHECK GLUCOSE ONCE DAILY Patient taking differently: 1 each by Other route daily. 05/25/18   [name], MD  acetaminophen (TYLENOL) 500 MG tablet Take 1,000 mg by mouth every 6 (six) hours as needed for mild pain.    [provider]  carboxymethylcellulose (REFRESH PLUS) 0.5 % SOLN Place 1 drop into both eyes 3 (three) times daily as needed (eye irritation).    [provider]  cetirizine (ZYRTEC) 10 MG tablet TAKE ONE TABLET BY MOUTH DAILY Patient taking differently: Take 10 mg by mouth daily. 06/09/21   Masters, Katie, DO  clotrimazole-betamethasone (LOTRISONE) cream Apply 1 Application topically 2 (two) times daily. 09/21/21   Edrick Kins, DPM  cycloSPORINE (RESTASIS) 0.05 % ophthalmic emulsion Place 1 drop into both eyes 2 (two) times daily as needed (dry eyes). Patient not taking: Reported on 11/16/2021    [provider]  diphenhydrAMINE (BENADRYL) 25 MG tablet Take 1 tablet (25 mg total) by mouth every 6 (six) hours as needed. Patient taking differently: Take 25 mg by mouth every 6 (six) hours as needed for allergies. 2/87/86   Campbell Stall P, DO  Ferrous Sulfate (IRON PO) Take 1 tablet by mouth daily.    [provider]  ferrous sulfate  325 (65 FE) MG EC tablet Take 325 mg by mouth daily with breakfast.    [provider]  Fluconazole POWD 1 Application by Does not apply route daily. Patient not taking: Reported on 11/16/2021 10/08/21   Delene Ruffini, MD  fluticasone [facility]) 50 MCG/ACT nasal spray Place 1 spray into both nostrils daily as needed for allergies. 05/13/20 12/26/21  [name], MD  gabapentin (NEURONTIN) 300 MG capsule TAKE TWO CAPSULES BY MOUTH THREE TIMES A DAY 01/15/22   Masters, Katie, DO  metFORMIN (GLUCOPHAGE) 500 MG tablet TAKE HALF TABLET BY MOUTH DAILY WITH BREAKFAST Patient taking differently: Take 250 mg by mouth See admin instructions. TAKE HALF TABLET BY MOUTH DAILY WITH BREAKFAST 12/17/21   Masters, Katie, DO  OXYGEN Inhale 4 L into the lungs continuous.    [provider]  pantoprazole (PROTONIX) 20 MG tablet TAKE ONE TABLET BY MOUTH TWICE A DAY 11/16/21   Masters, Katie, DO  potassium chloride SA (KLOR-CON M) 20 MEQ tablet TAKE TWO TABLETS BY MOUTH TWICE A DAY 09/14/21   Masters, Katie, DO  pravastatin (PRAVACHOL) 20 MG tablet TAKE ONE TABLET BY MOUTH DAILY 09/22/21   Masters, [name], DO  sildenafil (REVATIO) 20 MG tablet Take 1 tablet (20 mg total) by mouth 3 (three) times daily. 09/21/21   Bensimhon, [name], MD  spironolactone (ALDACTONE) 25 MG tablet TAKE HALF TABLET BY MOUTH DAILY 06/09/21   Masters, [name], DO  torsemide (DEMADEX) 20 MG tablet TAKE FOUR TABLETS BY MOUTH TWICE A DAY 01/15/22   Masters, Katie, DO  treprostinil (REMODULIN) 5 MG/ML SOLN injection See admin instructions. As of 08/09/2019: Add 7 ml of Remodulin to cassette and 93 ml of sterile diluent for Remodulin to cassetet for a total volume of 100 ml to make a concentration of 350,000 ng per ml.  Infuse via a CADD pump intravenously at a rate of 40 ml per 24 hours. Based on a dosing weight of 120 kg, the dose is 81 ng per kg per min.  Once opened, discard Remodulin vial after 30 days.  Sterile Diluent for Remodulin  vials are single use only. Once mixed, discard cassette after 48 hours.    [provider]  vitamin B-12 (CYANOCOBALAMIN) 500 MCG tablet TAKE ONE TABLET BY MOUTH DAILY 10/19/21   Masters, Katie, DO  XARELTO 20 MG TABS tablet TAKE ONE TABLET BY MOUTH DAILY WITH SUPPER Patient taking differently: 20 mg. 11/16/21   Masters, [name], DO    Inpatient Medications: Scheduled Meds:  Chlorhexidine Gluconate Cloth  6 each Topical Daily   Chlorhexidine Gluconate Cloth  6 each Topical Daily   insulin aspart  0-9 Units Subcutaneous Q4H   pantoprazole  20 mg Oral BID   pravastatin  20 mg Oral Daily   sildenafil  20 mg Oral TID   sodium chloride flush  10-40 mL Intracatheter Q12H   Continuous Infusions:  azithromycin (ZITHROMAX) 500 mg in sodium chloride 0.9 % 250 mL IVPB     cefTRIAXone (ROCEPHIN)  IV     potassium chloride 10 mEq (02/01/22 0046)   treprostinil (REMODULIN) 35,000,000 ng in sodium chloride 0.9 % 100 mL (350,000 ng/mL) infusion     PRN Meds: acetaminophen, docusate sodium, ipratropium-albuterol, ondansetron (ZOFRAN) IV, phenol, polyethylene glycol, sodium chloride flush  Allergies:    Allergies  Allergen Reactions   Aspirin Swelling    REACTION: airway swelling   Codeine Other (See Comments)    REACTION: tingling in lips and hard breathing - had reaction at dentist - states "I can't take certain kinds of codeine" - happened maybe 10 yr ago   Lisinopril Cough   Sulfonamide Derivatives Swelling    REACTION: airway swelling   Latex Rash    Social History:   Social History   Socioeconomic History   Marital status: Single    Spouse name: Not on file   Number of children: Not on file   Years of education: Not on file   Highest education level: Not on file  Occupational History   Not on file  Tobacco Use   Smoking status: Never   Smokeless tobacco: Never  Vaping Use   Vaping Use: Never used  Substance and Sexual Activity   Alcohol use: No    Alcohol/week: 0.0  standard drinks of alcohol   Drug use: No  Sexual activity: Not Currently  Other Topics Concern   Not on file  Social History Narrative   Not on file   Social Determinants of Health   Financial Resource Strain: Low Risk  (06/21/2018)   Overall Financial Resource Strain (CARDIA)    Difficulty of Paying Living Expenses: Not very hard  Food Insecurity: Food Insecurity Present (11/05/2021)   Hunger Vital Sign    Worried About Running Out of Food in the Last Year: Sometimes true    Ran Out of Food in the Last Year: Sometimes true  Transportation Needs: Unmet Transportation Needs (11/16/2021)   PRAPARE - Hydrologist (Medical): Yes    Lack of Transportation (Non-Medical): Yes  Physical Activity: Insufficiently Active (04/22/2021)   Exercise Vital Sign    Days of Exercise per Week: 2 days    Minutes of Exercise per Session: 20 min  Stress: No Stress Concern Present (06/21/2018)   [place]    Feeling of Stress : Not at all  Social Connections: Socially Isolated (11/05/2021)   Social Connection and Isolation Panel [NHANES]    Frequency of Communication with Friends and Family: More than three times a week    Frequency of Social Gatherings with Friends and Family: More than three times a week    Attends Religious Services: Never    Marine scientist or Organizations: No    Attends Archivist Meetings: Never    Marital Status: Never married  Intimate Partner Violence: Not At Risk (11/05/2021)   Humiliation, Afraid, Rape, and Kick questionnaire    Fear of Current or Ex-Partner: No    Emotionally Abused: No    Physically Abused: No    Sexually Abused: No    Family History:   Family History  Problem Relation Age of Onset   Kidney Stones Son    Mental illness Sister    Bipolar disorder Sister    Mental retardation Brother    Hyperlipidemia Mother    Breast cancer Maternal Aunt     Colon cancer Neg Hx    Pancreatic cancer Neg Hx    Esophageal cancer Neg Hx     Physical Exam/Data:   Vitals:   01/27/2022 2245 01/21/2022 2255 01/14/2022 2345 02/01/22 0000  BP: 104/68  105/71 107/72  Pulse: 94 93 94 91  Resp: (!) 28 (!) 37 (!) 25 (!) 23  Temp:      TempSrc:      SpO2: 90% (!) 89% 94% 93%  Weight:      Height:        Intake/Output Summary (Last 24 hours) at 02/01/2022 0149 Last data filed at 01/18/2022 2330 Gross per 24 hour  Intake 347.62 ml  Output 875 ml  Net -527.38 ml      01/18/2022    7:28 PM 12/25/2021    3:41 PM 11/12/2021   11:11 AM  Last 3 Weights  Weight (lbs) 259 lb 14.8 oz 260 lb 273 lb 3.2 oz  Weight (kg) 117.9 kg 117.935 kg 123.923 kg     Body mass index is 46.04 kg/m.  General:  Ill appearing on BiPAP.  HEENT: MMM. BiPAP.  Neck: JVP above mandible with HOB 30 degrees.  Cardiac:  Clear S1, S2; RRR. Lungs:  diminished breath sounds BiL.  Abd: abdominal distension, tympanic, nontender. Active bowel sounds.  Ext: no edema, WWP. Chronic venous stasis changes.  Musculoskeletal:  No deformities, BUE and BLE strength  normal and equal Skin: no rashes or lesions.   EKG:  The EKG was personally reviewed and demonstrates:  sinus tachycardia, R axis deviation.  Relevant CV Studies: RHC/LHC 05/14/19 Findings: Ao = 94/64 (78) LV = 86/11 RA =  6 RV = 69/4 PA = 63/17 (37) PCW = 7 Fick cardiac output/index = 6.0/2.8 PVR = 5.0 WU Ao sat = 96% PA sat = 66%, 68%  Prior Echocardiograms: ECHO 08/11/12 EF 60-65% RV moderately dilated Peak PA pressure 48 mmhg  ECHO 02/28/13 EF 55-60% Grade I DD RV severely dilated and HK. Trivial TR. Septum flat Peak PA pressure 53 mmhg ECHO 11/15 EF 55-60% RV normal Trivial TR ECHO 1/16 at [facility] EF >55% RV mildly dilated (improved function) Trivial TR normal IVC ECHO 11/2014: EF 55-60%. RV modrately dilated. Peak PA pressure 78 mm hg ECHO 03/2015: At [facility] RV severely dilated. EF >55% RVSP 62mHG [facility]  RV severely dilated  Echo 4/21 LVEF 65-70% RV moderately dilated/HK Echo 06/2021 - LVEF > 55%. function. RV moderate dysfunction. Trivial MR.    Laboratory Data:  High Sensitivity Troponin:   Recent Labs  Lab 01/15/2022 1940 01/30/2022 2135  TROPONINIHS 371* 373*     Chemistry Recent Labs  Lab 01/04/2022 1940 01/22/2022 1950 01/19/2022 2140  NA 128* 129* 131*  K 2.5* 2.5* 2.5*  CL 87*  --   --   CO2 25  --   --   GLUCOSE 144*  --   --   BUN 36*  --   --   CREATININE 1.03*  --   --   CALCIUM 8.6*  --   --   GFRNONAA >60  --   --   ANIONGAP 16*  --   --     Recent Labs  Lab 01/19/2022 1940  PROT 6.3*  ALBUMIN 2.8*  AST 34  ALT 18  ALKPHOS 67  BILITOT 1.3*   Hematology Recent Labs  Lab 01/05/2022 1940 01/22/2022 1950 01/30/2022 2140  WBC 25.4*  --   --   RBC 4.91  --   --   HGB 14.1 15.3* 15.0  HCT 41.3 45.0 44.0  MCV 84.1  --   --   MCH 28.7  --   --   MCHC 34.1  --   --   RDW 15.8*  --   --   PLT 186  --   --    BNP Recent Labs  Lab 01/14/2022 1940  BNP 449.8*    DDimer  Recent Labs  Lab 01/27/2022 1940  DDIMER 2.68*    Radiology/Studies:  DG Abd 1 View  Result Date: 01/20/2022 CLINICAL DATA:  Abdominal distension EXAM: ABDOMEN - 1 VIEW COMPARISON:  02/12/2012 FINDINGS: Scattered large and small bowel gas is noted. No obstructive changes are seen. No free air is noted. No abnormal mass or abnormal calcifications are seen. Bony structures are within normal limits. IMPRESSION: No acute abnormality noted. Electronically Signed   By: MInez CatalinaM.D.   On: 02/03/2022 23:51   DG Chest Portable 1 View  Result Date: 01/07/2022 CLINICAL DATA:  Shortness of breath EXAM: PORTABLE CHEST 1 VIEW COMPARISON:  12/25/2021 FINDINGS: Mild right basilar opacity, likely atelectasis. Left lung is essentially clear. No pleural effusion or pneumothorax. Mild cardiomegaly. Left IJ venous catheter terminating in the right atrium. IMPRESSION: Mild right basilar opacity, likely  atelectasis. Electronically Signed   By: SJulian HyM.D.   On: 01/21/2022 19:53   {  Assessment  and Plan:   #Acute on Chronic Hypoxic Respiratory Failure  #PAH (Group I, II, III) Ms. Schellinger's baseline O2 requirement at home is 4L. On her home 4L, her oxygen saturation at outpatient visits over the past 6 months has ranged 90-98%. She does not monitor her O2 saturation at home. For her PAH, Ms. Defibaugh follows with Dr. Gilles Chiquito in [facility] clinic. Since 2015, she has been receiving combination therapy with remodulin and sildenafil. There have been no recent dose adjustments and she denies missing any of her medications. In the setting of new leukocytosis, procal>2, new RLL opacification, and subacute presentation, agree that a primary infectious process superimposed on chronic cardiopulmonary disease is most likely etiology for hypoxic respiratory failure. As outlined below, volume overload also contributing.  - Agree with empiric CAP coverage - Continue current pulmonary vasodilator therapies - Diuresis as outlined below  #Chronic Diastolic Heart Failure Difficult volume assessment with chronic venous distension in setting of severe PH. JVP elevated above mandible with HOB 45 degrees. IVC dilated with no respirophasic variation. B-lines are visible in apices bilaterally. BNP elevated to 450 (of note, last time BNP was elevated in this range RHC confirmed elevated filling pressures and patient required admission + aggressive diuresis at [facility]). Although her weight is not up at time of current admission, I suspect this is reflective of chronic lean body mass loss given clinical history and baseline albumin 2.5-2.8.  - Repeat TTE - Low threshold for repeat RHC if no improvement with initial interventions, treatment of infection as above - Will require increased diuretic dose; would recommend starting with '160mg'$  IV lasix this AM with plan for rebolus + gtt if no response.   #Troponin  Elevation Patient adamantly denies any new chest discomfort. EKG negative for any acute ischemic change. Troponin elevation is most consistent with myocardial injury in the setting of critical illness.  - No indication for ACS treatment at this time.   #Atrial Fibrillation Sinus tachycardia at time of current ICU admission. Home Xarelto held and patient was transitioned to heparin gtt in ICU.   For questions or updates, please contact [place] Please consult [email or web address] for contact info under    Signed, Delorse Limber, MD  02/01/2022 1:49 AM

## 2022-02-01 NOTE — Progress Notes (Addendum)
eLink Physician-Brief Progress Note Patient Name: [name] DOB: 07/21/1960 MRN: [id number]   Date of Service  02/01/2022  HPI/Events of Note  66 F with chronic diastolic heart failure, cor pulmonale, severe PH on remodulin and sildenafil, AF, OSA who presents for AHRF, LE swelling and medical nonadherence. Started on BiPAP. Diuresed and also started on antibiotics for possible R pneumonia. Patient reports possible nausea, tolerating mask ok for now  eICU Interventions  Continuous BiPAP. Ok to briefly remove for chloraseptic spray for sore throat  ABG in am  Diuresis, duonebs, CAP coverage   Zofran PRN ordered  Will need to consult HF team in am     Intervention Category Evaluation Type: New Patient Evaluation  Royal Beirne Rodman Pickle 02/01/2022, 1:10 AM

## 2022-02-01 NOTE — Progress Notes (Addendum)
Consult to notify [facility] of patient with remodulin via portable home pump. Patient endorsed maintaining her own set up and pump care. Tabitha RN notified to contact [facility], [facility], and pharmacy should any issues with current administration set up arise.

## 2022-02-01 NOTE — Progress Notes (Signed)
PHARMACY - PHYSICIAN COMMUNICATION CRITICAL VALUE ALERT - BLOOD CULTURE IDENTIFICATION (BCID)  Courtney A Caisse is an 62 y.o. female who presented to [facility] on 01/21/2022 with a chief complaint of SOB  Assessment: 29 YOF with known pulmonary HTN on chronic remodulin with L-IJ double lumen CVC present PTA (placed 12/27/22). Original concern for PNA and started on CAP treatment, however now with 2 of 4 bottles growing GPC in clusters with BCID detecting MSSA.   Name of physician (or Provider) Contacted: Tamala Julian (CCM) + ID ([place], automatic ID consult)  Current antibiotics: Rocephin + Azithro  Changes to prescribed antibiotics recommended:  Narrow to Cefazolin 2g IV every 8 hours  Results for orders placed or performed during the hospital encounter of 01/04/2022  Blood Culture ID Panel (Reflexed) (Collected: 01/20/2022  7:40 PM)  Result Value Ref Range   Enterococcus faecalis NOT DETECTED NOT DETECTED   Enterococcus Faecium NOT DETECTED NOT DETECTED   Listeria monocytogenes NOT DETECTED NOT DETECTED   Staphylococcus species DETECTED (A) NOT DETECTED   Staphylococcus aureus (BCID) DETECTED (A) NOT DETECTED   Staphylococcus epidermidis NOT DETECTED NOT DETECTED   Staphylococcus lugdunensis NOT DETECTED NOT DETECTED   Streptococcus species NOT DETECTED NOT DETECTED   Streptococcus agalactiae NOT DETECTED NOT DETECTED   Streptococcus pneumoniae NOT DETECTED NOT DETECTED   Streptococcus pyogenes NOT DETECTED NOT DETECTED   A.calcoaceticus-baumannii NOT DETECTED NOT DETECTED   Bacteroides fragilis NOT DETECTED NOT DETECTED   Enterobacterales NOT DETECTED NOT DETECTED   Enterobacter cloacae complex NOT DETECTED NOT DETECTED   Escherichia coli NOT DETECTED NOT DETECTED   Klebsiella aerogenes NOT DETECTED NOT DETECTED   Klebsiella oxytoca NOT DETECTED NOT DETECTED   Klebsiella pneumoniae NOT DETECTED NOT DETECTED   Proteus species NOT DETECTED NOT DETECTED   Salmonella species NOT DETECTED  NOT DETECTED   Serratia marcescens NOT DETECTED NOT DETECTED   Haemophilus influenzae NOT DETECTED NOT DETECTED   Neisseria meningitidis NOT DETECTED NOT DETECTED   Pseudomonas aeruginosa NOT DETECTED NOT DETECTED   Stenotrophomonas maltophilia NOT DETECTED NOT DETECTED   Candida albicans NOT DETECTED NOT DETECTED   Candida auris NOT DETECTED NOT DETECTED   Candida glabrata NOT DETECTED NOT DETECTED   Candida krusei NOT DETECTED NOT DETECTED   Candida parapsilosis NOT DETECTED NOT DETECTED   Candida tropicalis NOT DETECTED NOT DETECTED   Cryptococcus neoformans/gattii NOT DETECTED NOT DETECTED   Meth resistant mecA/C and MREJ NOT DETECTED NOT DETECTED    Thank you for allowing pharmacy to be a part of this patient's care.  Alycia Rossetti, PharmD, BCPS Infectious Diseases Clinical Pharmacist 02/01/2022 11:50 AM   **Pharmacist phone directory can now be found on [email or web address] (PW [email or web address]).  Listed under [place].

## 2022-02-02 ENCOUNTER — Ambulatory Visit: Payer: Medicaid Other | Admitting: Occupational Therapy

## 2022-02-02 DIAGNOSIS — I50813 Acute on chronic right heart failure: Secondary | ICD-10-CM | POA: Diagnosis not present

## 2022-02-02 DIAGNOSIS — Z7189 Other specified counseling: Secondary | ICD-10-CM | POA: Diagnosis not present

## 2022-02-02 DIAGNOSIS — T80211A Bloodstream infection due to central venous catheter, initial encounter: Secondary | ICD-10-CM

## 2022-02-02 DIAGNOSIS — Z515 Encounter for palliative care: Secondary | ICD-10-CM

## 2022-02-02 DIAGNOSIS — Z66 Do not resuscitate: Secondary | ICD-10-CM | POA: Diagnosis not present

## 2022-02-02 DIAGNOSIS — I272 Pulmonary hypertension, unspecified: Secondary | ICD-10-CM | POA: Diagnosis not present

## 2022-02-02 DIAGNOSIS — B9561 Methicillin susceptible Staphylococcus aureus infection as the cause of diseases classified elsewhere: Secondary | ICD-10-CM

## 2022-02-02 DIAGNOSIS — R7881 Bacteremia: Secondary | ICD-10-CM | POA: Diagnosis not present

## 2022-02-02 DIAGNOSIS — J9601 Acute respiratory failure with hypoxia: Secondary | ICD-10-CM | POA: Diagnosis not present

## 2022-02-02 DIAGNOSIS — J9621 Acute and chronic respiratory failure with hypoxia: Secondary | ICD-10-CM | POA: Diagnosis not present

## 2022-02-02 DIAGNOSIS — T80211D Bloodstream infection due to central venous catheter, subsequent encounter: Secondary | ICD-10-CM | POA: Diagnosis not present

## 2022-02-02 LAB — BASIC METABOLIC PANEL
Anion gap: 6 (ref 5–15)
Anion gap: 11 (ref 5–15)
Anion gap: 11 (ref 5–15)
Anion gap: 9 (ref 5–15)
BUN: 40 mg/dL — ABNORMAL HIGH (ref 6–20)
BUN: 42 mg/dL — ABNORMAL HIGH (ref 6–20)
BUN: 40 mg/dL — ABNORMAL HIGH (ref 6–20)
BUN: 39 mg/dL — ABNORMAL HIGH (ref 6–20)
CO2: 28 mmol/L (ref 22–32)
CO2: 28 mmol/L (ref 22–32)
CO2: 27 mmol/L (ref 22–32)
CO2: 31 mmol/L (ref 22–32)
Calcium: 7.9 mg/dL — ABNORMAL LOW (ref 8.9–10.3)
Calcium: 8.3 mg/dL — ABNORMAL LOW (ref 8.9–10.3)
Calcium: 8 mg/dL — ABNORMAL LOW (ref 8.9–10.3)
Calcium: 8.2 mg/dL — ABNORMAL LOW (ref 8.9–10.3)
Chloride: 100 mmol/L (ref 98–111)
Chloride: 97 mmol/L — ABNORMAL LOW (ref 98–111)
Chloride: 96 mmol/L — ABNORMAL LOW (ref 98–111)
Chloride: 96 mmol/L — ABNORMAL LOW (ref 98–111)
Creatinine, Ser: 0.9 mg/dL (ref 0.44–1.00)
Creatinine, Ser: 0.99 mg/dL (ref 0.44–1.00)
Creatinine, Ser: 0.98 mg/dL (ref 0.44–1.00)
Creatinine, Ser: 0.94 mg/dL (ref 0.44–1.00)
GFR, Estimated: >60 mL/min (ref >60)
GFR, Estimated: >60 mL/min (ref >60)
GFR, Estimated: >60 mL/min (ref >60)
GFR, Estimated: >60 mL/min (ref >60)
Glucose, Bld: 140 mg/dL — ABNORMAL HIGH (ref 70–99)
Glucose, Bld: 143 mg/dL — ABNORMAL HIGH (ref 70–99)
Glucose, Bld: 177 mg/dL — ABNORMAL HIGH (ref 70–99)
Glucose, Bld: 148 mg/dL — ABNORMAL HIGH (ref 70–99)
Potassium: >7.5 mmol/L (ref 3.5–5.1)
Potassium: 3.5 mmol/L (ref 3.5–5.1)
Potassium: 3.4 mmol/L — ABNORMAL LOW (ref 3.5–5.1)
Potassium: 3.2 mmol/L — ABNORMAL LOW (ref 3.5–5.1)
Sodium: 134 mmol/L — ABNORMAL LOW (ref 135–145)
Sodium: 136 mmol/L (ref 135–145)
Sodium: 134 mmol/L — ABNORMAL LOW (ref 135–145)
Sodium: 136 mmol/L (ref 135–145)

## 2022-02-02 LAB — CBC
HCT: 39 % (ref 36.0–46.0)
Hemoglobin: 13.1 g/dL (ref 12.0–15.0)
MCH: 28.8 pg (ref 26.0–34.0)
MCHC: 33.6 g/dL (ref 30.0–36.0)
MCV: 85.7 fL (ref 80.0–100.0)
Platelets: 147 10*3/uL — ABNORMAL LOW (ref 150–400)
RBC: 4.55 MIL/uL (ref 3.87–5.11)
RDW: 16.9 % — ABNORMAL HIGH (ref 11.5–15.5)
WBC: 25.2 10*3/uL — ABNORMAL HIGH (ref 4.0–10.5)
nRBC: 0.2 % (ref 0.0–0.2)

## 2022-02-02 LAB — BLOOD CULTURE ID PANEL (REFLEXED) - BCID2

## 2022-02-02 LAB — URINE CULTURE

## 2022-02-02 LAB — HEPARIN LEVEL (UNFRACTIONATED)
Heparin Unfractionated: 0.1 IU/mL — ABNORMAL LOW (ref 0.30–0.70)
Heparin Unfractionated: 0.13 IU/mL — ABNORMAL LOW (ref 0.30–0.70)
Heparin Unfractionated: 0.18 IU/mL — ABNORMAL LOW (ref 0.30–0.70)

## 2022-02-02 LAB — GLUCOSE, CAPILLARY
Glucose-Capillary: 120 mg/dL — ABNORMAL HIGH (ref 70–99)
Glucose-Capillary: 136 mg/dL — ABNORMAL HIGH (ref 70–99)
Glucose-Capillary: 140 mg/dL — ABNORMAL HIGH (ref 70–99)
Glucose-Capillary: 184 mg/dL — ABNORMAL HIGH (ref 70–99)
Glucose-Capillary: 158 mg/dL — ABNORMAL HIGH (ref 70–99)
Glucose-Capillary: 144 mg/dL — ABNORMAL HIGH (ref 70–99)

## 2022-02-02 LAB — MAGNESIUM: Magnesium: 2.2 mg/dL (ref 1.7–2.4)

## 2022-02-02 LAB — LEGIONELLA PNEUMOPHILA SEROGP 1 UR AG: L. pneumophila Serogp 1 Ur Ag: NEGATIVE

## 2022-02-02 LAB — PHOSPHORUS: Phosphorus: 3.9 mg/dL (ref 2.5–4.6)

## 2022-02-02 MED ORDER — METOLAZONE 2.5 MG PO TABS
2.5000 mg | ORAL_TABLET | Freq: Once | ORAL | Status: AC
  Administered 2022-02-02: 2.5 mg via ORAL
  Filled 2022-02-02: qty 1

## 2022-02-02 MED ORDER — ORAL CARE MOUTH RINSE
15.0000 mL | OROMUCOSAL | Status: DC
  Administered 2022-02-02 (×3): 15 mL via OROMUCOSAL

## 2022-02-02 MED ORDER — POTASSIUM CHLORIDE 10 MEQ/50ML IV SOLN
10.0000 meq | INTRAVENOUS | Status: AC
  Administered 2022-02-02 – 2022-02-03 (×6): 10 meq via INTRAVENOUS
  Filled 2022-02-02 (×6): qty 50

## 2022-02-02 MED ORDER — ORAL CARE MOUTH RINSE
15.0000 mL | OROMUCOSAL | Status: DC
  Administered 2022-02-02 – 2022-02-10 (×31): 15 mL via OROMUCOSAL

## 2022-02-02 MED ORDER — GABAPENTIN 300 MG PO CAPS
300.0000 mg | ORAL_CAPSULE | Freq: Three times a day (TID) | ORAL | Status: DC
  Administered 2022-02-02 (×2): 300 mg via ORAL
  Filled 2022-02-02: qty 1

## 2022-02-02 MED ORDER — ORAL CARE MOUTH RINSE: 15.0000 mL | OROMUCOSAL | Status: DC | PRN

## 2022-02-02 MED ORDER — POTASSIUM CHLORIDE CRYS ER 10 MEQ PO TBCR
40.0000 meq | EXTENDED_RELEASE_TABLET | Freq: Once | ORAL | Status: AC
  Administered 2022-02-02: 40 meq via ORAL
  Filled 2022-02-02: qty 4

## 2022-02-02 MED ORDER — GABAPENTIN 300 MG PO CAPS
600.0000 mg | ORAL_CAPSULE | Freq: Three times a day (TID) | ORAL | Status: DC
  Administered 2022-02-02 – 2022-02-10 (×24): 600 mg via ORAL
  Filled 2022-02-02 (×26): qty 2

## 2022-02-02 MED ORDER — GABAPENTIN 300 MG PO CAPS
300.0000 mg | ORAL_CAPSULE | Freq: Three times a day (TID) | ORAL | Status: DC
  Filled 2022-02-02: qty 1

## 2022-02-02 MED ORDER — GABAPENTIN 300 MG PO CAPS
300.0000 mg | ORAL_CAPSULE | Freq: Once | ORAL | Status: AC
  Administered 2022-02-02: 300 mg via ORAL
  Filled 2022-02-02: qty 1

## 2022-02-02 NOTE — Progress Notes (Signed)
eLink Physician-Brief Progress Note Patient Name: [name] DOB: [birth year] MRN: [id number]   Date of Service  02/02/2022  HPI/Events of Note  Persistent diarrhea. Requesting imodium. On cefazolin, worsening WBC, and otherwise hemodynamically stable  eICU Interventions  Reasonable to start imodium, but important to exclude cdiff first.      Intervention Category Intermediate Interventions: Infection - evaluation and management  Nikolas Casher 02/02/2022, 9:06 PM

## 2022-02-02 NOTE — TOC CM/SW Note (Signed)
Marland Kitchen  Durable Medical Equipment  (From admission, onward)           Start     Ordered   02/02/22 1632  For home use only DME Hospital bed  Once       Question Answer Comment  Length of Need Lifetime   Patient has (list medical condition): COPD, CHF   The above medical condition requires: Patient requires the ability to reposition frequently   Head must be elevated greater than: 45 degrees   Bed type Semi-electric   Trapeze Bar Yes   Support Surface: Gel Overlay      02/02/22 1632

## 2022-02-02 NOTE — Progress Notes (Signed)
[place] Progress Note Patient Name: [name] DOB: 1960/04/03 MRN: [id number]   Date of Service  02/02/2022  HPI/Events of Note  Severe lowere extremity pain, same as baseline. No new renal dysfunction  eICU Interventions  Start gabapentin at half home dose, can escalate if no evidence of delirium and pain persists     Intervention Category Minor Interventions: Routine modifications to care plan (e.g. PRN medications for pain, fever)  Tobie Hellen 02/02/2022, 1:24 AM

## 2022-02-02 NOTE — Progress Notes (Signed)
[place] for Heparin  Indication: atrial fibrillation Brief A/P: Heparin level subtherapeutic Increase Heparin rate  Allergies  Allergen Reactions   Aspirin Swelling    REACTION: airway swelling   Codeine Other (See Comments)    REACTION: tingling in lips and hard breathing - had reaction at dentist - states "I can't take certain kinds of codeine" - happened maybe 10 yr ago   Lisinopril Cough   Sulfonamide Derivatives Swelling    REACTION: airway swelling   Latex Rash   Patient Measurements: Height: '5\' 3"'$  (160 cm) Weight: 116.4 kg (256 lb 9.9 oz) IBW/kg (Calculated) : 52.4 Heparin dosing wt: 81 kg  Vital Signs: Temp: 98 F (36.7 C) (01/30 0400) Temp Source: Axillary (01/30 0400) BP: 97/59 (01/30 0500) Pulse Rate: 79 (01/30 0500)  Labs: Recent Labs    01/14/2022 1940 01/23/2022 1950 02/01/22 0241 02/01/22 0429 02/01/22 0827 02/01/22 0920 02/01/22 1152 02/01/22 1717 02/01/22 2046 02/02/22 0243 02/02/22 0452  HGB 14.1   < > 13.8 14.3  --   --   --   --   --   --  13.1  HCT 41.3   < > 39.8 42.0  --   --   --   --   --   --  39.0  PLT 186  --  164  --   --   --   --   --   --   --  147*  HEPARINUNFRC  --   --   --   --   --   --  <0.10*  --  <0.10*  --  0.10*  CREATININE 1.03*  --  1.01*  --  1.09*  --   --  0.91  --  0.90  --   TROPONINIHS 371*   < > 299*  --  204* 188*  --   --   --   --   --    < > = values in this interval not displayed.     Estimated Creatinine Clearance: 81.9 mL/min (by C-G formula based on SCr of 0.9 mg/dL).   Assessment: 63 y.o. female with h/o Afib, Xarelto on hold,for heparin  Goal of Therapy:  Heparin level 0.3-0.7 units/ml Monitor platelets by anticoagulation protocol: Yes   Plan:  Increase Heparin 1900 units/hr Check heparin level in 8 hours.  Phillis Knack, PharmD, BCPS 02/02/2022 5:47 AM

## 2022-02-02 NOTE — Progress Notes (Signed)
[facility] for Heparin (Xarelto on hold) Indication: atrial fibrillation  Allergies  Allergen Reactions   Aspirin Swelling    REACTION: airway swelling   Codeine Other (See Comments)    REACTION: tingling in lips and hard breathing - had reaction at dentist - states "I can't take certain kinds of codeine" - happened maybe 10 yr ago   Lisinopril Cough   Sulfonamide Derivatives Swelling    REACTION: airway swelling   Latex Rash   Patient Measurements: Height: '5\' 3"'$  (160 cm) Weight: 118.1 kg (260 lb 5.8 oz) IBW/kg (Calculated) : 52.4 Heparin dosing wt: 81 kg  Vital Signs: Temp: 98 F (36.7 C) (01/30 2000) Temp Source: Axillary (01/30 2000) BP: 105/57 (01/30 2200) Pulse Rate: 82 (01/30 2200)  Labs: Recent Labs    02/02/2022 1940 01/13/2022 1950 02/01/22 0241 02/01/22 0429 02/01/22 0827 02/01/22 0920 02/01/22 1152 02/02/22 0243 02/02/22 0452 02/02/22 0625 02/02/22 1414 02/02/22 2137  HGB 14.1   < > 13.8 14.3  --   --   --   --  13.1  --   --   --   HCT 41.3   < > 39.8 42.0  --   --   --   --  39.0  --   --   --   PLT 186  --  164  --   --   --   --   --  147*  --   --   --   HEPARINUNFRC  --   --   --   --   --   --    < >  --  0.10*  --  0.13* 0.18*  CREATININE 1.03*  --  1.01*  --  1.09*  --    < > 0.90  --  0.99 0.98  --   TROPONINIHS 371*   < > 299*  --  204* 188*  --   --   --   --   --   --    < > = values in this interval not displayed.     Estimated Creatinine Clearance: 75.8 mL/min (by C-G formula based on SCr of 0.98 mg/dL).   Assessment: 62 y/o F with HF and PAH presents to the ED with shortness breath. She is on Xarelto PTA for afib. Holding Xarelto and starting Heparin. The patient states she hasn't taken her Xarelto in over one week.   Heparin level remains subtherapeutic (0.18) on infusion at 2150 units/hr. No issues with line or bleeding reported per RN.   Goal of Therapy:  Heparin level 0.3-0.7  units/ml Monitor platelets by anticoagulation protocol: Yes   Plan:  Increase heparin gtt to 2350 units/hr F/u 6 hour heparin level  Sherlon Handing, PharmD, BCPS Please see amion for complete clinical pharmacist phone list 02/02/2022 10:33 PM

## 2022-02-02 NOTE — Progress Notes (Signed)
[facility] for Infectious Disease  Date of Admission:  01/04/2022      Total days of antibiotics 2   Cefazolin 1/29 >> c           ASSESSMENT: [name] is a 62 y.o. female with MSSA bacteremia admitted for a/c respiratory failure. 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 She is not to the point where we can safely do TEE given her respiratory status and high risk for intubation and would favor empiric course of endocarditis treatment.  Central line holiday when safely feasible per PCCM/AHF team - would avoid central access at least 48h for line holiday, plan to repeat BCx after removal.   Continue cefazolin for tx - duration tentatively 6w following line removal unless we can determine no IE. For replacement line, she will probably need dual lumen given continuous PAH IV admin.   No new concern for metastatic sites of infection today    PLAN: Continue cefazolin  Follow timing for line removal / holiday Repeat BCx after #2     Principal Problem:   MSSA bacteremia Active Problems:   Acute respiratory failure with hypoxia (HCC)   Hyponatremia   Hypokalemia   Community acquired pneumonia   Acute on chronic right-sided congestive heart failure (HCC)    Chlorhexidine Gluconate Cloth  6 each Topical Daily   Chlorhexidine Gluconate Cloth  6 each Topical Daily   furosemide  80 mg Intravenous BID   gabapentin  600 mg Oral TID   insulin aspart  0-9 Units Subcutaneous Q4H   mouth rinse  15 mL Mouth Rinse 4 times per day   pantoprazole  20 mg Oral BID   pravastatin  20 mg Oral Daily   sildenafil  20 mg Oral TID   sodium chloride flush  10-40 mL Intracatheter Q12H    SUBJECTIVE: Feeling just a little better maybe, but too early to tell. Now on HFNC.  Had a problem with line displacement in the past (December) where it got snagged during meal prep requiring replacement. This was  about 22mago. She has been on the intravenous medication for years to her estimate.    Review of Systems: Review of Systems  Constitutional:  Positive for malaise/fatigue. Negative for chills and fever.  Respiratory:  Positive for sputum production and shortness of breath. Negative for cough.   Cardiovascular:  Negative for chest pain, palpitations and leg swelling.  Genitourinary:  Negative for dysuria.  Musculoskeletal:  Negative for back pain.  Neurological:  Negative for dizziness.      Allergies  Allergen Reactions   Aspirin Swelling    REACTION: airway swelling   Codeine Other (See Comments)    REACTION: tingling in lips and hard breathing - had reaction at dentist - states "I can't take certain kinds of codeine" - happened maybe 10 yr ago   Lisinopril Cough   Sulfonamide Derivatives Swelling    REACTION: airway swelling   Latex Rash    OBJECTIVE: Vitals:   02/02/22 1000 02/02/22 1100 02/02/22 1132 02/02/22 1200  BP: (!) 104/59 (!) 102/53  (!) 100/57  Pulse: 81 80 80 81  Resp: (!) 35 (!) 31 (!) 24 (!) 22  Temp:   98.5 F (36.9 C)   TempSrc:   Oral   SpO2: (!) 89% (!) 88% (!) 87% (!) 86%  Weight:      Height:  Body mass index is 46.12 kg/m.   Physical Exam Constitutional:      Appearance: She is obese. She is ill-appearing.  Cardiovascular:     Rate and Rhythm: Normal rate.  Pulmonary:     Effort: Tachypnea present.     Breath sounds: Wheezing present.  Chest:     Comments: Old scab on right chest from previous CL removal  Skin:    General: Skin is warm and dry.     Capillary Refill: Capillary refill takes less than 2 seconds.  Neurological:     Mental Status: She is alert and oriented to person, place, and time.      Lab Results Lab Results  Component Value Date   WBC 25.2 (H) 02/02/2022   HGB 13.1 02/02/2022   HCT 39.0 02/02/2022   MCV 85.7 02/02/2022   PLT 147 (L) 02/02/2022    Lab Results  Component Value Date   CREATININE 0.99  02/02/2022   BUN 42 (H) 02/02/2022   NA 136 02/02/2022   K 3.5 02/02/2022   CL 97 (L) 02/02/2022   CO2 28 02/02/2022    Lab Results  Component Value Date   ALT 18 01/30/2022   AST 34 01/10/2022   ALKPHOS 67 01/21/2022   BILITOT 1.3 (H) 01/12/2022     Microbiology: Recent Results (from the past 240 hour(s))  Urine Culture (for pregnant, neutropenic or urologic patients or patients with an indwelling urinary catheter)     Status: Abnormal   Collection Time: 01/06/2022 12:57 AM   Specimen: Urine, Clean Catch  Result Value Ref Range Status   Specimen Description URINE, CLEAN CATCH  Final   Special Requests   Final    NONE Performed at [facility], [place] [address] [place], [place] [address]    Culture MULTIPLE SPECIES PRESENT, SUGGEST RECOLLECTION (A)  Final   Report Status 02/02/2022 FINAL  Final  Resp panel by RT-PCR (RSV, Flu A&B, Covid) Anterior Nasal Swab     Status: None   Collection Time: 02/02/2022  7:33 PM   Specimen: Anterior Nasal Swab  Result Value Ref Range Status   SARS Coronavirus 2 by RT PCR NEGATIVE NEGATIVE Final    Comment: (NOTE) SARS-CoV-2 target nucleic acids are NOT DETECTED.  The SARS-CoV-2 RNA is generally detectable in upper respiratory specimens during the acute phase of infection. The lowest concentration of SARS-CoV-2 viral copies this assay can detect is 138 copies/mL. A negative result does not preclude SARS-Cov-2 infection and should not be used as the sole basis for treatment or other patient management decisions. A negative result may occur with  improper specimen collection/handling, submission of specimen other than nasopharyngeal swab, presence of viral mutation(s) within the areas targeted by this assay, and inadequate number of viral copies(<138 copies/mL). A negative result must be combined with clinical observations, patient history, and epidemiological information. The expected result is Negative.  Fact Sheet for Patients:   EntrepreneurPulse.com.au  Fact Sheet for Healthcare Providers:  IncredibleEmployment.be  This test is no t yet approved or cleared by the [place] FDA and  has been authorized for detection and/or diagnosis of SARS-CoV-2 by FDA under an Emergency Use Authorization (EUA). This EUA will remain  in effect (meaning this test can be used) for the duration of the COVID-19 declaration under Section 564(b)(1) of the Act, 21 U.S.C.section 360bbb-3(b)(1), unless the authorization is terminated  or revoked sooner.       Influenza A by PCR NEGATIVE NEGATIVE Final  Influenza B by PCR NEGATIVE NEGATIVE Final    Comment: (NOTE) The Xpert Xpress SARS-CoV-2/FLU/RSV plus assay is intended as an aid in the diagnosis of influenza from Nasopharyngeal swab specimens and should not be used as a sole basis for treatment. Nasal washings and aspirates are unacceptable for Xpert Xpress SARS-CoV-2/FLU/RSV testing.  Fact Sheet for Patients: EntrepreneurPulse.com.au  Fact Sheet for Healthcare Providers: IncredibleEmployment.be  This test is not yet approved or cleared by the [place] FDA and has been authorized for detection and/or diagnosis of SARS-CoV-2 by FDA under an Emergency Use Authorization (EUA). This EUA will remain in effect (meaning this test can be used) for the duration of the COVID-19 declaration under Section 564(b)(1) of the Act, 21 U.S.C. section 360bbb-3(b)(1), unless the authorization is terminated or revoked.     Resp Syncytial Virus by PCR NEGATIVE NEGATIVE Final    Comment: (NOTE) Fact Sheet for Patients: EntrepreneurPulse.com.au  Fact Sheet for Healthcare Providers: IncredibleEmployment.be  This test is not yet approved or cleared by the [place] FDA and has been authorized for detection and/or diagnosis of SARS-CoV-2 by FDA under an Emergency Use  Authorization (EUA). This EUA will remain in effect (meaning this test can be used) for the duration of the COVID-19 declaration under Section 564(b)(1) of the Act, 21 U.S.C. section 360bbb-3(b)(1), unless the authorization is terminated or revoked.  Performed at [facility], [place] [address], [place], [place] [address]   Blood culture (routine x 2)     Status: Abnormal (Preliminary result)   Collection Time: 01/14/2022  7:40 PM   Specimen: BLOOD  Result Value Ref Range Status   Specimen Description BLOOD RIGHT ANTECUBITAL  Final   Special Requests   Final    BOTTLES DRAWN AEROBIC AND ANAEROBIC Blood Culture adequate volume   Culture  Setup Time   Final    GRAM POSITIVE COCCI IN CLUSTERS IN BOTH AEROBIC AND ANAEROBIC BOTTLES CRITICAL RESULT CALLED TO, READ BACK BY AND VERIFIED WITH: PHARMD E.MARTIN AT 4403 02/01/2022 BY T.SAAD.    Culture (A)  Final    STAPHYLOCOCCUS AUREUS SUSCEPTIBILITIES TO FOLLOW Performed at [facility], [place] [address], [place], [place] [address]    Report Status PENDING  Incomplete  Blood Culture ID Panel (Reflexed)     Status: Abnormal   Collection Time: 01/20/2022  7:40 PM  Result Value Ref Range Status   Enterococcus faecalis NOT DETECTED NOT DETECTED Final   Enterococcus Faecium NOT DETECTED NOT DETECTED Final   Listeria monocytogenes NOT DETECTED NOT DETECTED Final   Staphylococcus species DETECTED (A) NOT DETECTED Final    Comment: CRITICAL RESULT CALLED TO, READ BACK BY AND VERIFIED WITH: PHARMD E.MARTIN AT 1130 02/01/2022 BY T.SAAD.    Staphylococcus aureus (BCID) DETECTED (A) NOT DETECTED Final    Comment: CRITICAL RESULT CALLED TO, READ BACK BY AND VERIFIED WITH: PHARMD E.MARTIN AT 1130 02/01/2022 BY T.SAAD.    Staphylococcus epidermidis NOT DETECTED NOT DETECTED Final   Staphylococcus lugdunensis NOT DETECTED NOT DETECTED Final   Streptococcus species NOT DETECTED NOT DETECTED Final   Streptococcus agalactiae NOT DETECTED NOT  DETECTED Final   Streptococcus pneumoniae NOT DETECTED NOT DETECTED Final   Streptococcus pyogenes NOT DETECTED NOT DETECTED Final   A.calcoaceticus-baumannii NOT DETECTED NOT DETECTED Final   Bacteroides fragilis NOT DETECTED NOT DETECTED Final   Enterobacterales NOT DETECTED NOT DETECTED Final   Enterobacter cloacae complex NOT DETECTED NOT DETECTED Final   Escherichia coli NOT DETECTED NOT DETECTED Final  Klebsiella aerogenes NOT DETECTED NOT DETECTED Final   Klebsiella oxytoca NOT DETECTED NOT DETECTED Final   Klebsiella pneumoniae NOT DETECTED NOT DETECTED Final   Proteus species NOT DETECTED NOT DETECTED Final   Salmonella species NOT DETECTED NOT DETECTED Final   Serratia marcescens NOT DETECTED NOT DETECTED Final   Haemophilus influenzae NOT DETECTED NOT DETECTED Final   Neisseria meningitidis NOT DETECTED NOT DETECTED Final   Pseudomonas aeruginosa NOT DETECTED NOT DETECTED Final   Stenotrophomonas maltophilia NOT DETECTED NOT DETECTED Final   Candida albicans NOT DETECTED NOT DETECTED Final   Candida auris NOT DETECTED NOT DETECTED Final   Candida glabrata NOT DETECTED NOT DETECTED Final   Candida krusei NOT DETECTED NOT DETECTED Final   Candida parapsilosis NOT DETECTED NOT DETECTED Final   Candida tropicalis NOT DETECTED NOT DETECTED Final   Cryptococcus neoformans/gattii NOT DETECTED NOT DETECTED Final   Meth resistant mecA/C and MREJ NOT DETECTED NOT DETECTED Final    Comment: Performed at [facility], [place] [address], [place], [place] [address]  Blood culture (routine x 2)     Status: None (Preliminary result)   Collection Time: 01/10/2022  8:41 PM   Specimen: BLOOD RIGHT HAND  Result Value Ref Range Status   Specimen Description BLOOD RIGHT HAND  Final   Special Requests   Final    BOTTLES DRAWN AEROBIC AND ANAEROBIC Blood Culture adequate volume   Culture   Final    NO GROWTH 2 DAYS Performed at [facility], [place] [address] [place], [place] [address]     Report Status PENDING  Incomplete  MRSA Next Gen by PCR, Nasal     Status: None   Collection Time: 02/01/22 12:38 AM   Specimen: Nasal Mucosa; Nasal Swab  Result Value Ref Range Status   MRSA by PCR Next Gen NOT DETECTED NOT DETECTED Final    Comment: (NOTE) The GeneXpert MRSA Assay (FDA approved for NASAL specimens only), is one component of a comprehensive MRSA colonization surveillance program. It is not intended to diagnose MRSA infection nor to guide or monitor treatment for MRSA infections. Test performance is not FDA approved in patients less than 78 years old. Performed at [facility], [place] [address] [place], [place] [address]   Culture, blood (Routine X 2) w Reflex to ID Panel     Status: None (Preliminary result)   Collection Time: 02/01/22  4:53 PM   Specimen: BLOOD  Result Value Ref Range Status   Specimen Description BLOOD SITE NOT SPECIFIED  Final   Special Requests   Final    BOTTLES DRAWN AEROBIC AND ANAEROBIC Blood Culture adequate volume   Culture   Final    NO GROWTH < 24 HOURS Performed at [facility], [place] [address] [place], [place] [address]    Report Status PENDING  Incomplete    Janene Madeira, MSN, NP-C [facility] for Infectious Disease [email or web address] Pager: [phone] Office: [phone] [facility] Main Line: [place] Communication Welcome

## 2022-02-02 NOTE — Consult Note (Signed)
Consultation Note Date: 02/02/2022   Patient Name: [name]  DOB: [birth year]  MRN: [id number]  Age / Sex: 62 y.o., female  PCP: Christiana Fuchs, DO Referring Physician: Candee Furbish, MD  Reason for Consultation: Establishing goals of care  HPI/Patient Profile: 62 y.o. female  with past medical history of chronic diastolic heart failure, cor pulmonale, severe pulmonary hypertension (on Remodulin and sildenafil), atrial fibrillation on Xarelto, HTN, HLD, CKD stage 3b, OSA aon CPAP, diabetes, GERD admitted on 02/03/2022 with aspiration pneumonia, pulmonary hypertension, CHF exacerbation, MSSA bacteremia initially requiring BiPAP support.   Clinical Assessment and Goals of Care: Consult received and extensive chart review performed. I discussed status with RN. I met today with [name] who is tolerating heated HFNC but sats tenuous even with talking and conversation. Marveline shares that she feels a little better than when she came in. She tries piecing together events just prior to hospitalization and expresses regret that she should have sought help sooner. She is able to list her medical issues as well as infection in her blood and pneumonia. She tells me that she knows that her body is weak from her chronic health issues with poor heart and lung function and these are barriers to improvement. She shares that she is hopeful for improvement and admits that she likely needs to consider going to a facility where she can have more help to manage her care and medications. She currently lives in a "studio apartment" right behind her son's home - on the same property. She lives independently.   We further discussed her goals of care in the setting of her current illness. [name] is hopeful for improvement and agrees with management with BiPAP and HFNC as needed and all medications as needed to try and help her to improve.  I discussed with her concern that the medical team was worried she might would need a ventilator or breathing machine. I asked [name] how she would feel about being placed on a breathing machine or life support if she got worse instead of better. She expresses hope that she can improve with time with current interventions but does clearly state that she would not desire to be intubated. She tells me that she has been told that she would not be expected to be able to improve and successfully come off ventilator so she would not want to pursue this interventions. She says that Dr. Sung Amabile, her [facility] doctor, and her son are aware of her wishes. I clarified her desire for DNR and she confirms. I reassured [name] that we will continue to support her and help to improve the best we are able. I offered to call her son and answer any questions or concerns he may have but she says he is working until ~6pm so not to call him.   All questions/concerns addressed. Emotional support provided.   Primary Decision Maker PATIENT    SUMMARY OF RECOMMENDATIONS   - DNR decided and per patient this has been discussed and her wishes are known  to her son and primary outpatient providers - Continue full scope but no plans for intubation - Time for outcomes  Code Status/Advance Care Planning: DNR   Symptom Management:  Per PCCM  Prognosis:  Overall prognosis poor with severe underlying co-morbidities and now acute illness.  Discharge Planning: To Be Determined      Primary Diagnoses: Present on Admission:  Acute respiratory failure with hypoxia ([place])  MSSA bacteremia   I have reviewed the medical record, interviewed the patient and family, and examined the patient. The following aspects are pertinent.  Past Medical History:  Diagnosis Date   Acute on chronic diastolic (congestive) heart failure ([employer]) 06/25/2013   COPD (chronic obstructive pulmonary disease) (HCC)    Cor pulmonale (HCC)    PA Peak  pressure 83mHg   Depression    Diabetes mellitus    well controlled on metformin   External hemorrhoid 01/11/2020   GERD (gastroesophageal reflux disease)    H/O mental retardation    Hyperlipidemia    Hypertension    OSA (obstructive sleep apnea)    CPAP   Pulmonary hypertension (HCC)    Restrictive lung disease    PFTs 06/2012 (FVC 54% predicted and FEV1 68% predicted w minimal bronchodilator response).   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 ulcer (HCC)    chornic, ?followed up at wound care center, multiple courses of antibiotics in past for cellulitis, on lasix   Social History   Socioeconomic History   Marital status: Single    Spouse name: Not on file   Number of children: Not on file   Years of education: Not on file   Highest education level: Not on file  Occupational History   Not on file  Tobacco Use   Smoking status: Never   Smokeless tobacco: Never  Vaping Use   Vaping Use: Never used  Substance and Sexual Activity   Alcohol use: No    Alcohol/week: 0.0 standard drinks of alcohol   Drug use: No   Sexual activity: Not Currently  Other Topics Concern   Not on file  Social History Narrative   Not on file   Social Determinants of Health   Financial Resource Strain: Low Risk  (06/21/2018)   Overall Financial Resource Strain (CARDIA)    Difficulty of Paying Living Expenses: Not very hard  Food Insecurity: Food Insecurity Present (11/05/2021)   Hunger Vital Sign    Worried About Running Out of Food in the Last Year: Sometimes true    Ran Out of Food in the Last Year: Sometimes true  Transportation Needs: Unmet Transportation Needs (11/16/2021)   PRAPARE - THydrologist(Medical): Yes    Lack of Transportation (Non-Medical): Yes  Physical Activity: Insufficiently Active (04/22/2021)   Exercise Vital Sign    Days of Exercise per Week: 2  days    Minutes of Exercise per Session: 20 min  Stress: No Stress Concern Present (06/21/2018)   [place]   Feeling of Stress : Not at all  Social Connections: Socially Isolated (11/05/2021)   Social Connection and Isolation Panel [NHANES]    Frequency of Communication with Friends and Family: More than three times a week    Frequency of Social Gatherings with Friends and Family: More than three times a week    Attends Religious Services: Never  Active Member of Clubs or Organizations: No    Attends Archivist Meetings: Never    Marital Status: Never married   Family History  Problem Relation Age of Onset   Kidney Stones Son    Mental illness Sister    Bipolar disorder Sister    Mental retardation Brother    Hyperlipidemia Mother    Breast cancer Maternal Aunt    Colon cancer Neg Hx    Pancreatic cancer Neg Hx    Esophageal cancer Neg Hx    Scheduled Meds:  Chlorhexidine Gluconate Cloth  6 each Topical Daily   Chlorhexidine Gluconate Cloth  6 each Topical Daily   furosemide  80 mg Intravenous BID   gabapentin  300 mg Oral TID   insulin aspart  0-9 Units Subcutaneous Q4H   mouth rinse  15 mL Mouth Rinse 4 times per day   pantoprazole  20 mg Oral BID   pravastatin  20 mg Oral Daily   sildenafil  20 mg Oral TID   sodium chloride flush  10-40 mL Intracatheter Q12H   Continuous Infusions:  sodium chloride Stopped (02/01/22 1154)    ceFAZolin (ANCEF) IV Stopped (02/02/22 0551)   heparin 1,900 Units/hr (02/02/22 0611)   treprostinil (REMODULIN) 35,000,000 ng in pH 12 sterile diluent 100 mL (350,000 ng/mL) infusion 81 ng/kg/min (02/02/22 0123)   PRN Meds:.acetaminophen, docusate sodium, Gerhardt's butt cream, ipratropium-albuterol, ondansetron (ZOFRAN) IV, mouth rinse, phenol, polyethylene glycol, sodium chloride flush Allergies  Allergen Reactions   Aspirin Swelling    REACTION: airway swelling    Codeine Other (See Comments)    REACTION: tingling in lips and hard breathing - had reaction at dentist - states "I can't take certain kinds of codeine" - happened maybe 10 yr ago   Lisinopril Cough   Sulfonamide Derivatives Swelling    REACTION: airway swelling   Latex Rash   Review of Systems  Constitutional:  Positive for activity change, appetite change and fatigue.  Respiratory:  Positive for cough and shortness of breath.   Musculoskeletal:        Neuropathy in bilateral feet  Neurological:  Positive for weakness.    Physical Exam Vitals and nursing note reviewed.  Constitutional:      General: She is not in acute distress.    Appearance: She is ill-appearing.  Cardiovascular:     Rate and Rhythm: Normal rate.  Pulmonary:     Effort: Tachypnea present. No accessory muscle usage or respiratory distress.     Comments: Labored breathing intermittently at rest even with conversation; very little reserve  Abdominal:     General: There is distension.     Palpations: Abdomen is soft.  Neurological:     Mental Status: She is oriented to person, place, and time.     Vital Signs: BP (!) 99/50   Pulse 80   Temp 98.1 F (36.7 C) (Oral)   Resp (!) 23   Ht '5\' 3"'$  (1.6 m)   Wt 118.1 kg   LMP 02/19/2011   SpO2 (!) 89%   BMI 46.12 kg/m  Pain Scale: 0-10   Pain Score: 5    SpO2: SpO2: (!) 89 % O2 Device:SpO2: (!) 89 % O2 Flow Rate: .O2 Flow Rate (L/min): 60 L/min  IO: Intake/output summary:  Intake/Output Summary (Last 24 hours) at 02/02/2022 0939 Last data filed at 02/02/2022 0600 Gross per 24 hour  Intake 1164.26 ml  Output 1726 ml  Net -561.74 ml    LBM: Last BM  Date : 01/17/2022 Baseline Weight: Weight: 117.9 kg Most recent weight: Weight: 118.1 kg     Palliative Assessment/Data:     Time In: 1200  Time Total: 85 min  Greater than 50%  of this time was spent counseling and coordinating care related to the above assessment and plan.  Signed by: Vinie Sill, NP Palliative Medicine Team Pager # [phone] (M-F 8a-5p) Team Phone # [phone] (Nights/Weekends)

## 2022-02-02 NOTE — Progress Notes (Signed)
[place] for Heparin (Xarelto on hold) Indication: atrial fibrillation  Allergies  Allergen Reactions   Aspirin Swelling    REACTION: airway swelling   Codeine Other (See Comments)    REACTION: tingling in lips and hard breathing - had reaction at dentist - states "I can't take certain kinds of codeine" - happened maybe 10 yr ago   Lisinopril Cough   Sulfonamide Derivatives Swelling    REACTION: airway swelling   Latex Rash   Patient Measurements: Height: '5\' 3"'$  (160 cm) Weight: 118.1 kg (260 lb 5.8 oz) IBW/kg (Calculated) : 52.4  Vital Signs: Temp: 97.4 F (36.3 C) (01/30 1559) Temp Source: Oral (01/30 1559) BP: 97/52 (01/30 1500) Pulse Rate: 80 (01/30 1500)  Labs: Recent Labs    01/19/2022 1940 01/17/2022 1950 02/01/22 0241 02/01/22 0429 02/01/22 0827 02/01/22 0920 02/01/22 1152 02/01/22 2046 02/02/22 0243 02/02/22 0452 02/02/22 0625 02/02/22 1414  HGB 14.1   < > 13.8 14.3  --   --   --   --   --  13.1  --   --   HCT 41.3   < > 39.8 42.0  --   --   --   --   --  39.0  --   --   PLT 186  --  164  --   --   --   --   --   --  147*  --   --   HEPARINUNFRC  --   --   --   --   --   --    < > <0.10*  --  0.10*  --  0.13*  CREATININE 1.03*  --  1.01*  --  1.09*  --    < >  --  0.90  --  0.99 0.98  TROPONINIHS 371*   < > 299*  --  204* 188*  --   --   --   --   --   --    < > = values in this interval not displayed.     Estimated Creatinine Clearance: 75.8 mL/min (by C-G formula based on SCr of 0.98 mg/dL).   Medical History: Past Medical History:  Diagnosis Date   Acute on chronic diastolic (congestive) heart failure ([place]) 06/25/2013   COPD (chronic obstructive pulmonary disease) (HCC)    Cor pulmonale (HCC)    PA Peak pressure 55mHg   Depression    Diabetes mellitus    well controlled on metformin   External hemorrhoid 01/11/2020   GERD (gastroesophageal reflux disease)    H/O mental retardation    Hyperlipidemia     Hypertension    OSA (obstructive sleep apnea)    CPAP   Pulmonary hypertension (HCC)    Restrictive lung disease    PFTs 06/2012 (FVC 54% predicted and FEV1 68% predicted w minimal bronchodilator response).   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 ulcer (HCC)    chornic, ?followed up at wound care center, multiple courses of antibiotics in past for cellulitis, on lasix    Assessment: 62y/o F with HF and PAH presents to the ED with shortness breath. She is on Xarelto PTA for afib. Holding Xarelto and starting Heparin. The patient states she hasn't taken her Xarelto in over one week.   Heparin level subtherapeutic s/p rate increase to 1900 units/hr, uptrend from undetectable  Goal  of Therapy:  Heparin level 0.3-0.7 units/ml Monitor platelets by anticoagulation protocol: Yes   Plan:  Increase heparin gtt to 2150 units/hr F/u 6 hour heparin level  Bertis Ruddy, PharmD, [place] Pharmacist ED Pharmacist Phone # [phone] 02/02/2022 4:11 PM

## 2022-02-02 NOTE — Progress Notes (Addendum)
Advanced Heart Failure Rounding Note  PCP-Cardiologist: Dr. Haroldine Laws    Subjective:    Off BiPAP. Transitioned to heated high flow O2.   On Ancef for suspected aspiration PNA and bacteremia. BCx from 1/28 + for Staph aureus.   WBC remain elevated at 25K. PCT 2.02.   On treprostinil + sildenafil for PH.   1.7L + several unmeasured urinary occurences yesterday w/ IV Lasix (issues w/ purwick).  CVP ~10. SCr stable 0.99   Initial K >7.5.  Repeat K 3.5   She feels a little bit better today.   Objective:   Weight Range: 118.1 kg Body mass index is 46.12 kg/m.   Vital Signs:   Temp:  [97 F (36.1 C)-98 F (36.7 C)] 98 F (36.7 C) (01/30 0400) Pulse Rate:  [74-86] 80 (01/30 0731) Resp:  [18-33] 23 (01/30 0731) BP: (88-123)/(50-89) 99/50 (01/30 0731) SpO2:  [89 %-94 %] 89 % (01/30 0731) FiO2 (%):  [100 %] 100 % (01/30 0731) Weight:  [118.1 kg] 118.1 kg (01/30 0500) Last BM Date : 01/16/2022  Weight change: Filed Weights   02/01/22 0030 02/01/22 0400 02/02/22 0500  Weight: 116.4 kg 116.4 kg 118.1 kg    Intake/Output:   Intake/Output Summary (Last 24 hours) at 02/02/2022 0836 Last data filed at 02/02/2022 0600 Gross per 24 hour  Intake 1174.98 ml  Output 1726 ml  Net -551.02 ml      Physical Exam    CVP 10  General:  obese, chronically ill appearing on high flow [place] HEENT: Normal Neck: Supple. Thick neck, JVD not well visualized. Carotids 2+ bilat; no bruits. No lymphadenopathy or thyromegaly appreciated. Cor: PMI nondisplaced. Regular rate & rhythm. No rubs, gallops or murmurs. Lungs: Clear Abdomen: obese, soft, nontender, nondistended. No hepatosplenomegaly. No bruits or masses. Good bowel sounds. Extremities: No cyanosis, clubbing, rash, trace b/l LE edema,  +unna boots  Neuro: Alert & orientedx3, cranial nerves grossly intact. moves all 4 extremities w/o difficulty. Affect pleasant   Telemetry   NSR 80s, 10 beat run NSVT   EKG    N/A   Labs     CBC Recent Labs    01/28/2022 1940 01/20/2022 1950 02/01/22 0241 02/01/22 0429 02/02/22 0452  WBC 25.4*  --  24.6*  --  25.2*  NEUTROABS 22.6*  --   --   --   --   HGB 14.1   < > 13.8 14.3 13.1  HCT 41.3   < > 39.8 42.0 39.0  MCV 84.1  --  84.0  --  85.7  PLT 186  --  164  --  147*   < > = values in this interval not displayed.   Basic Metabolic Panel Recent Labs    02/01/22 0241 02/01/22 0429 02/02/22 0243 02/02/22 0625  NA 130*   < > 134* 136  K 2.8*   < > >7.5* 3.5  CL 90*   < > 100 97*  CO2 28   < > 28 28  GLUCOSE 180*   < > 140* 143*  BUN 35*   < > 40* 42*  CREATININE 1.01*   < > 0.90 0.99  CALCIUM 8.2*   < > 7.9* 8.3*  MG 2.5*  --   --  2.2  PHOS  --   --   --  3.9   < > = values in this interval not displayed.   Liver Function Tests Recent Labs    01/10/2022 1940  AST 34  ALT 18  ALKPHOS 67  BILITOT 1.3*  PROT 6.3*  ALBUMIN 2.8*   No results for input(s): "LIPASE", "AMYLASE" in the last 72 hours. Cardiac Enzymes No results for input(s): "CKTOTAL", "CKMB", "CKMBINDEX", "TROPONINI" in the last 72 hours.  BNP: BNP (last 3 results) Recent Labs    07/02/21 2136 01/21/2022 1940  BNP 29.1 449.8*    ProBNP (last 3 results) No results for input(s): "PROBNP" in the last 8760 hours.   D-Dimer Recent Labs    01/20/2022 1940  DDIMER 2.68*   Hemoglobin A1C Recent Labs    02/01/22 0241  HGBA1C 6.1*   Fasting Lipid Panel No results for input(s): "CHOL", "HDL", "LDLCALC", "TRIG", "CHOLHDL", "LDLDIRECT" in the last 72 hours. Thyroid Function Tests No results for input(s): "TSH", "T4TOTAL", "T3FREE", "THYROIDAB" in the last 72 hours.  Invalid input(s): "FREET3"  Other results:   Imaging    ECHOCARDIOGRAM COMPLETE  Result Date: 02/01/2022    ECHOCARDIOGRAM REPORT   Patient Name:   LACRESHA FUSILIER Date of Exam: 02/01/2022 Medical Rec #:  [id number]       Height:       63.0 in Accession #:    [id number]      Weight:       256.6 lb Date of Birth:   1960-11-12       BSA:          2.150 m Patient Age:    62 years        BP:           100/60 mmHg Patient Gender: F               HR:           81 bpm. Exam Location:  Inpatient Procedure: 2D Echo, Color Doppler, Cardiac Doppler, Intracardiac Opacification            Agent and Strain Analysis Indications:    CHF- Acute diastolic/Pulmonary hypertension  History:        Patient has prior history of Echocardiogram examinations, most                 recent 05/03/2019. CHF, Pulmonary HTN and COPD, Arrythmias:Atrial                 Fibrillation, Signs/Symptoms:Shortness of Breath; Risk                 Factors:Sleep Apnea, Diabetes and Hypertension. Cor pulmonale,                 CKD.  Sonographer:    Eartha Inch Referring Phys: [id number] Chrystie Nose PAYNE  Sonographer Comments: Technically difficult study due to poor echo windows, patient is obese and echo performed with patient supine and on artificial respirator. Image acquisition challenging due to patient body habitus, Image acquisition challenging due  to respiratory motion and Image acquisition challenging due to COPD. RV strain attempted. IMPRESSIONS  1. Left ventricular ejection fraction, by estimation, is 60 to 65%. The left ventricle has normal function. The left ventricle has no regional wall motion abnormalities. Left ventricular diastolic parameters are consistent with Grade I diastolic dysfunction (impaired relaxation).  2. D-shaped septum suggestive of RV pressure/volume overload. Right ventricular systolic function is moderately reduced. The right ventricular size is moderately enlarged. There is moderately elevated pulmonary artery systolic pressure. The estimated right ventricular systolic pressure is 09.3 mmHg.  3. Left atrial size was mild to moderately dilated.  4. Right atrial size was mildly dilated.  5. The mitral valve is normal in structure. No evidence of mitral valve regurgitation. The mean mitral valve gradient is 2.0 mmHg.  6. Mild TR visualized,  ?if TR is actuallly worse as systolic flow reversal was noted in the hepatic vein doppler tracing.  7. The aortic valve is tricuspid. There is mild calcification of the aortic valve. Aortic valve regurgitation is not visualized.  8. The inferior vena cava is dilated in size with <50% respiratory variability, suggesting right atrial pressure of 15 mmHg.  9. A small pericardial effusion is present. Conclusion(s)/Recommendation(s): Findings consistent with Cor Pulmonale. FINDINGS  Left Ventricle: Left ventricular ejection fraction, by estimation, is 60 to 65%. The left ventricle has normal function. The left ventricle has no regional wall motion abnormalities. Definity contrast agent was given IV to delineate the left ventricular  endocardial borders. The left ventricular internal cavity size was normal in size. There is no left ventricular hypertrophy. Left ventricular diastolic parameters are consistent with Grade I diastolic dysfunction (impaired relaxation). Right Ventricle: D-shaped septum suggestive of RV pressure/volume overload. The right ventricular size is moderately enlarged. No increase in right ventricular wall thickness. Right ventricular systolic function is moderately reduced. There is moderately  elevated pulmonary artery systolic pressure. The tricuspid regurgitant velocity is 3.30 m/s, and with an assumed right atrial pressure of 15 mmHg, the estimated right ventricular systolic pressure is 28.4 mmHg. Left Atrium: Left atrial size was mild to moderately dilated. Right Atrium: Right atrial size was mildly dilated. Pericardium: A small pericardial effusion is present. Mitral Valve: The mitral valve is normal in structure. No evidence of mitral valve regurgitation. MV peak gradient, 4.5 mmHg. The mean mitral valve gradient is 2.0 mmHg. Tricuspid Valve: Mild TR visualized, ?if TR is actuallly worse as systolic flow reversal was noted in the hepatic vein doppler tracing. The tricuspid valve is normal in  structure. Tricuspid valve regurgitation is trivial. Aortic Valve: The aortic valve is tricuspid. There is mild calcification of the aortic valve. Aortic valve regurgitation is not visualized. Pulmonic Valve: The pulmonic valve was not well visualized. Pulmonic valve regurgitation is not visualized. Aorta: The aortic root is normal in size and structure. Venous: The inferior vena cava is dilated in size with less than 50% respiratory variability, suggesting right atrial pressure of 15 mmHg. IAS/Shunts: No atrial level shunt detected by color flow Doppler.  LEFT VENTRICLE PLAX 2D LVIDd:         5.00 cm   Diastology LVIDs:         3.50 cm   LV e' medial:    4.79 cm/s LV PW:         0.80 cm   LV E/e' medial:  17.6 LV IVS:        0.60 cm   LV e' lateral:   6.20 cm/s LVOT diam:     2.10 cm   LV E/e' lateral: 13.6 LV SV:         101 LV SV Index:   47 LVOT Area:     3.46 cm  RIGHT VENTRICLE             IVC RV S prime:     17.70 cm/s  IVC diam: 2.20 cm TAPSE (M-mode): 1.9 cm LEFT ATRIUM              Index        RIGHT ATRIUM           Index LA diam:  4.30 cm  2.00 cm/m   RA Area:     22.10 cm LA Vol (A2C):   115.0 ml 53.50 ml/m  RA Volume:   63.50 ml  29.54 ml/m LA Vol (A4C):   66.9 ml  31.12 ml/m LA Biplane Vol: 88.2 ml  41.03 ml/m  AORTIC VALVE LVOT Vmax:   139.00 cm/s LVOT Vmean:  99.500 cm/s LVOT VTI:    0.292 m  AORTA Ao Root diam: 3.10 cm Ao Asc diam:  3.20 cm MITRAL VALVE                TRICUSPID VALVE MV Area (PHT): 2.86 cm     TR Peak grad:   43.6 mmHg MV Area VTI:   3.08 cm     TR Mean grad:   32.0 mmHg MV Peak grad:  4.5 mmHg     TR Vmax:        330.00 cm/s MV Mean grad:  2.0 mmHg     TR Vmean:       270.0 cm/s MV Vmax:       1.06 m/s MV Vmean:      72.5 cm/s    SHUNTS MV Decel Time: 265 msec     Systemic VTI:  0.29 m MV E velocity: 84.40 cm/s   Systemic Diam: 2.10 cm MV A velocity: 102.00 cm/s MV E/A ratio:  0.83 Olof Marcil McleanMD Electronically signed by Franki Monte Signature Date/Time:  02/01/2022/4:19:21 PM    Final    VAS [place] LOWER EXTREMITY VENOUS (DVT)  Result Date: 02/01/2022  Lower Venous DVT Study Patient Name:  RUSHIE BRAZEL  Date of Exam:   02/01/2022 Medical Rec #: [id number]        Accession #:    [id number] Date of Birth: 10-04-1960        Patient Gender: F Patient Age:   62 years Exam Location:  [facility] Procedure:      VAS [place] LOWER EXTREMITY VENOUS (DVT) Referring Phys: Jenny Reichmann PAYNE --------------------------------------------------------------------------------  Indications: Swelling, and SOB.  Risk Factors: CHF. Limitations: Body habitus and positioning. Comparison Study: Prior negative bilateral LEV done 06/15/12 Performing Technologist: Sharion Dove RVS  Examination Guidelines: A complete evaluation includes B-mode imaging, spectral Doppler, color Doppler, and power Doppler as needed of all accessible portions of each vessel. Bilateral testing is considered an integral part of a complete examination. Limited examinations for reoccurring indications may be performed as noted. The reflux portion of the exam is performed with the patient in reverse Trendelenburg.  +---------+---------------+---------+-----------+----------+-------------------+ RIGHT    CompressibilityPhasicitySpontaneityPropertiesThrombus Aging      +---------+---------------+---------+-----------+----------+-------------------+ CFV      Full           Yes      Yes                                      +---------+---------------+---------+-----------+----------+-------------------+ SFJ      Full                                                             +---------+---------------+---------+-----------+----------+-------------------+ FV Prox  Full                                                             +---------+---------------+---------+-----------+----------+-------------------+  FV Mid   Full                                                              +---------+---------------+---------+-----------+----------+-------------------+ FV DistalFull                                                             +---------+---------------+---------+-----------+----------+-------------------+ PFV      Full                                                             +---------+---------------+---------+-----------+----------+-------------------+ POP      Full                                                             +---------+---------------+---------+-----------+----------+-------------------+ PTV                                                   Not well visualized +---------+---------------+---------+-----------+----------+-------------------+ PERO                                                  Not well visualized +---------+---------------+---------+-----------+----------+-------------------+   +---------+---------------+---------+-----------+----------+-------------------+ LEFT     CompressibilityPhasicitySpontaneityPropertiesThrombus Aging      +---------+---------------+---------+-----------+----------+-------------------+ CFV                                                   Not well visualized +---------+---------------+---------+-----------+----------+-------------------+ SFJ                                                   Not well visualized +---------+---------------+---------+-----------+----------+-------------------+ FV Prox  Full                                                             +---------+---------------+---------+-----------+----------+-------------------+ FV Mid   Full                                                             +---------+---------------+---------+-----------+----------+-------------------+  FV DistalFull                                                             +---------+---------------+---------+-----------+----------+-------------------+  PFV      Full                                                             +---------+---------------+---------+-----------+----------+-------------------+ POP      Full           Yes      Yes                                      +---------+---------------+---------+-----------+----------+-------------------+ PTV                                                   Not well visualized +---------+---------------+---------+-----------+----------+-------------------+ PERO                                                  Not well visualized +---------+---------------+---------+-----------+----------+-------------------+     Summary: BILATERAL: - No evidence of deep vein thrombosis seen in the lower extremities, bilaterally. -No evidence of popliteal cyst, bilaterally.   *See table(s) above for measurements and observations. Electronically signed by Harold Barban MD on 02/01/2022 at 3:56:43 PM.    Final      Medications:     Scheduled Medications:  Chlorhexidine Gluconate Cloth  6 each Topical Daily   Chlorhexidine Gluconate Cloth  6 each Topical Daily   furosemide  80 mg Intravenous BID   gabapentin  300 mg Oral TID   insulin aspart  0-9 Units Subcutaneous Q4H   mouth rinse  15 mL Mouth Rinse 4 times per day   pantoprazole  20 mg Oral BID   pravastatin  20 mg Oral Daily   sildenafil  20 mg Oral TID   sodium chloride flush  10-40 mL Intracatheter Q12H    Infusions:  sodium chloride Stopped (02/01/22 1154)    ceFAZolin (ANCEF) IV Stopped (02/02/22 0551)   heparin 1,900 Units/hr (02/02/22 0611)   treprostinil (REMODULIN) 35,000,000 ng in pH 12 sterile diluent 100 mL (350,000 ng/mL) infusion 81 ng/kg/min (02/02/22 0123)   treprostinil (REMODULIN) 35,000,000 ng in sodium chloride 0.9 % 100 mL (350,000 ng/mL) infusion      PRN Medications: acetaminophen, docusate sodium, Gerhardt's butt cream, ipratropium-albuterol, ondansetron (ZOFRAN) IV, mouth rinse, phenol, polyethylene  glycol, sodium chloride flush    Patient Profile   AKEYA RYTHER is a 62 y.o. female with a hx of morbid obesity, cognitive impairment, W0JW, chronic diastolic HF, pAF (Xarelto), severe pulmonary HTN (on combination therapy with treprostinil and sildenafil) who is being seen today for the evaluation of chronic HF and PH therapies in setting of acute  hypoxic respiratory failure requiring CICU admission.   Assessment/Plan   1. Acute on chronic hypoxemic respiratory failure: Baseline OHS/OSA on 4L home oxygen. Currently on Bipap.  She is being treating for community-acquired PNA on a baseline of pulmonary hypertension, OHS/OSA, COPD as well as RV failure.  - Continue abx per CCM.  - Lasix 80 mg IV bid.  - Continues on home IV treprostinil and sildenafil.  2. Pulmonary hypertension: Suspect mixed group 1, 2, and 3 (OHS/OSA, COPD).   [facility] is [facility] at [facility].  - Continue home IV treprostinil.  - Continue sildenafil 20 tid.  3. RV failure: Echo in 6/23 with EF > 55%, moderate RV dysfunction. CVP 10  - give another dose IV Lasix 80 mg this morning and follow CVP   - may need digoxin for RV support  - BP too soft for other GDMT 4. PNA: Suspect community-acquired PNA with PCT 2.02 and WBCs 25.  RLL infiltrate on CXR.  - Continue Ancef per CCM 5. Atrial fibrillation: Paroxysmal, on Xarelto at home.  She is in NSR.  - Continue heparin gtt for now.  6. Elevated Troponin: HS-TnI 371 => 299.  Suspect demand ischemia from hypoxemia and volume overload.  HS-TnI in 1000s range in 2021, cath at that time showed minimal CAD. 7. Bacteremia: BCx from 1/28 + for staph aureus  - on Ancef per CCM - will need tunneled PICC removed. Per CCM, currently too unstable from respiratory standpoint to go to IR. Plan removal once on lower O2 requirements   - will need TEE once improved from respiratory standpoint    Length of Stay: 2  Lyda Jester, PA-C  02/02/2022, 8:36 AM  Advanced Heart Failure  Team Pager [phone] (M-F; 7a - 5p)  Please contact [facility] for night-coverage after hours (5p -7a ) and weekends on [email or web address]  Patient seen with PA, agree with the above note.   Now on high flow oxygen.  CVP 10-11 but tracing is poor.    MSSA bacteremia, source may be tunneled catheter.  Also possible RLL PNA.   Echo (02/01/22) with EF 60-65%, D-shaped septum, moderate RV enlargement with moderately decreased systolic function, PASP 59, dilated IVC, no definite valvular vegetation.    General: NAD Neck: JVP difficult with thick neck, no thyromegaly or thyroid nodule.  Lungs: Distant BS.  CV: Nondisplaced PMI.  Heart regular S1/S2, no S3/S4, no murmur.  1+ chronic ankle edema.  Abdomen: Soft, nontender, no hepatosplenomegaly, no distention.  Skin: Intact without lesions or rashes.  Neurologic: Alert and oriented x 3.  Psych: Normal affect. Extremities: No clubbing or cyanosis.  HEENT: Normal.   MSSA bacteremia, source likely tunneled catheter.  Also has possible RLL PNA.  - Continue Ancef.  - No obvious vegetation on TTE. - Deferring TEE for now, will likely treat presumptively for endocarditis per ID.  If respiratory status stabilizes could do TEE in future but not yet.  - Will need tunneled catheter out when she is more stable/can lie flat.   Continue treprostinil IV and sildenafil for baseline pulmonary hypertension.   Acute hypoxemic respiratory failure due to possible PNA, volume overload/RV failure, pulmonary hypertension, baseline OHS/OSA. She is on high flow oxygen now, worry that she is at risk for intubation.  - Abx as above for possible PNA.  - Needs diuresis, IVC dilated on echo and CVP tracing poor but appears elevated.  Will give Lasix 80 mg IV bid today with a dose of metolazone.   She remains  in NSR, on heparin gtt while off Xarelto.   CRITICAL CARE Performed by: Loralie Champagne   Total critical care time: 35 minutes  Critical care time was exclusive of  separately billable procedures and treating other patients.  Critical care was necessary to treat or prevent imminent or life-threatening deterioration.  Critical care was time spent personally by me on the following activities: development of treatment plan with patient and/or surrogate as well as nursing, discussions with consultants, evaluation of patient's response to treatment, examination of patient, obtaining history from patient or surrogate, ordering and performing treatments and interventions, ordering and review of laboratory studies, ordering and review of radiographic studies, pulse oximetry and re-evaluation of patient's condition.  Loralie Champagne 02/02/2022 10:26 AM

## 2022-02-02 NOTE — Progress Notes (Signed)
NAME:  [name], MRN:  [id number], DOB:  [birth year], LOS: 2 ADMISSION DATE:  01/14/2022, CONSULTATION DATE:  1/28 REFERRING MD:  Dr. Regenia Skeeter, CHIEF COMPLAINT:  Acute hypoxic resp failure; chf vs. Copd exacerbation   History of Present Illness:  Patient is a 62 year old female with pertinent PMH chronic diastolic HF, cor pulmonale, severe pulmonary hypertension (on Remodulin and sildenafil), A-fib on Xarelto, OSA on CPAP presents to [facility] ED on 5/28 with SOB.  Patient states her breathing has worsened over the past few days.  States she is not very compliant with her medications at home.  States her breathing has gotten a lot more swollen over the past few days making it harder on her breathing.  On 1/28 her breathing was much worse and called EMS.  EMS found patient with sats 72% on room air and initially placed on O2 but required CPAP.  Upon arrival to [facility] ED on 1/28, patient in acute respiratory distress on BiPAP.  Initial BP 99/50.  RR 30s.  Afebrile but wbc 25.4 EKG with no signs of ischemia.  Given 80 IV Lasix. CXR w/ R basilar opacity. Given rocephin/azithro for possible cap.  Cultures obtained.  HF notified by ED physician for consult. PCCM consulted for ICU admission.  Pertinent ED labs: COVID/flu/RSV negative, NA 128, potassium 2.5, mag 1.8, creat 1.03, BNP 449,Troponin 371 troponin 371 then 373, VBG 7.46, 39, 50, 28.  Pertinent  Medical History   Past Medical History:  Diagnosis Date   Acute on chronic diastolic (congestive) heart failure ([place]) 06/25/2013   COPD (chronic obstructive pulmonary disease) (HCC)    Cor pulmonale (HCC)    PA Peak pressure 77mHg   Depression    Diabetes mellitus    well controlled on metformin   External hemorrhoid 01/11/2020   GERD (gastroesophageal reflux disease)    H/O mental retardation    Hyperlipidemia    Hypertension    OSA (obstructive sleep apnea)    CPAP   Pulmonary hypertension (HCC)    Restrictive lung disease    PFTs 06/2012  (FVC 54% predicted and FEV1 68% predicted w minimal bronchodilator response).   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 ulcer ([place]    chornic, ?followed up at wound care center, multiple courses of antibiotics in past for cellulitis, on lasix     Significant Hospital Events: Including procedures, antibiotic start and stop dates in addition to other pertinent events   1/28 admitted likely chf exacerbation on bipap 1/29 Remains on BiPAP, trialed HFNC but desaturated  Interim History / Subjective:  On HHFNC 60L at 100%, sats high 80s Subjectively she does feel a little better. Almost 2L UOP yesterday but per RN, not accurate as some leakage around pure-wick.  MSSA bacteremia identified yesterday, on ancef  Objective   Blood pressure (!) 99/50, pulse 80, temperature 98 F (36.7 C), temperature source Axillary, resp. rate (!) 23, height '5\' 3"'$  (1.6 m), weight 118.1 kg, last menstrual period 02/19/2011, SpO2 (!) 89 %. CVP:  [10 mmHg-17 mmHg] 11 mmHg  FiO2 (%):  [100 %] 100 %   Intake/Output Summary (Last 24 hours) at 02/02/2022 0751 Last data filed at 02/02/2022 0600 Gross per 24 hour  Intake 1466.36 ml  Output 1726 ml  Net -259.64 ml    Filed Weights   02/01/22 0030 02/01/22 0400 02/02/22 0500  Weight: 116.4 kg 116.4 kg 118.1 kg  Examination: General: Chronically ill appearing female, resting in bed, in NAD. Neuro: A&O x 3, no deficits. HEENT: /AT. Sclerae anicteric. [place] in place Cardiovascular: RRR, no M/R/G.  Lungs: Respirations even and unlabored.  CTA bilaterally, No W/R/R. Abdomen: Obese. BS x 4, soft, NT/ND.  Musculoskeletal: Unna boots in place. No gross deformities, 2+ edema bilaterally with venous stasis changes. Skin: Skin warm, no rashes.   Assessment & Plan:   Acute on chronic respiratory failure w/ hypoxia (On 4L O2) - Likely CHF exacerbation (patient states non  compliant w/ medications). LE duplex negative. Echo 1/29 with EF 60-65%, G1DD, RV dilated with mod reduced systolic function, PAP 58, small pericardial effusion). Hx OSA on home CPAP. Probable OHS. - Continue HHFNC as able. - BiPAP PRN. - Hopeful to avoid intubation here. - Continue Lasix as able, goal neg balance.  Acute on chronic diastolic chf/cor pulmonale  Pulmonary HTN Group 1, II, III - Followed by Dr. Gilles Chiquito at [facility]. Hx Afib on xarelto, HLD. - HF team following; appreciate recs. - Follow CVP's (~12) and continue Lasix as able for goal neg balance (curently '80mg'$  BID). - Consider resuming home Spironolactone, Torsemide. - Continue home Redmodulin, Sildenafil. - Continue Heparin gtt. - Consult Palliative Care.  MSSA bacteremia. - ID following, appreciate the assistance. - Continue Ancef. - F/u cultuers. - F/u TTE (unable to get TEE as she is not stable enough). - Ultimately needs line holiday but not stable enough right now.  Abdominal distention - likely ascites from hypervolemia. - Continue diuresis.  Chronic venous insufficiency / venous stasis. - F/u on LE Duplex. - Unna boots.  Hx DM. - SSI.  Hx GERD. - Continue PPI.   Best Practice (right click and "Reselect all SmartList Selections" daily)   Diet/type: NPO, sips with meds DVT prophylaxis: systemic heparin GI prophylaxis: PPI Lines: N/A Foley:  N/A Code Status:  full code Last date of multidisciplinary goals of care discussion [1/28 updated patient at bedside. Attempted to update son Legrand Como over phone but no answer]  Critical care time: 30 min.    Montey Hora, [facility] For pager details, please see AMION or use Epic chat  After 1900, please call [facility] for cross coverage needs 02/02/2022, 7:51 AM

## 2022-02-02 NOTE — TOC Initial Note (Signed)
Transition of Care [facility]) - Initial/Assessment Note    Patient Details  Name: [name] MRN: [id number] Date of Birth: 28-Aug-1960  [facility] of [facility]) CM/SW Contact:    Erenest Rasher, RN Phone Number: [phone] 02/02/2022, 4:27 PM  Clinical Narrative:                 HF TOC CM spoke to pt at bedside. States she lives in her own studio apt. Her son will provide transportation to appts. Pt also has Medicaid transportation. She is interested in hospital bed for the home. Gave permission to speak to son. She will need her bed taken out to make room for hospital bed.   Expected Discharge Plan: Skilled Nursing Facility Barriers to Discharge: Continued Medical Work up   Patient Goals and CMS Choice Patient states their goals for this hospitalization and ongoing recovery are:: would like to get better CMS Medicare.gov Compare Post Acute Care list provided to:: Patient        Expected Discharge Plan and Services   Discharge Planning Services: CM Consult   Living arrangements for the past 2 months: Apartment                                      Prior Living Arrangements/Services Living arrangements for the past 2 months: Apartment Lives with:: Self Patient language and need for interpreter reviewed:: Yes        Need for Family Participation in Patient Care: Yes (Comment) Care giver support system in place?: Yes (comment) Current home services: DME (cane, rolling walker, oxygen ([employer]), CPAP) Criminal Activity/Legal Involvement Pertinent to Current Situation/Hospitalization: No - Comment as needed  Activities of Daily Living      Permission Sought/Granted Permission sought to share information with : Family Supports, Case Manager Permission granted to share information with : Yes, Verbal Permission Granted  Share Information with NAME: Kristell Wooding     Permission granted to share info w Relationship: son  Permission granted to share info w  Contact Information: [phone]  Emotional Assessment Appearance:: Appears stated age Attitude/Demeanor/Rapport: Engaged Affect (typically observed): Accepting Orientation: : Oriented to Self, Oriented to Place, Oriented to  Time, Oriented to Situation   Psych Involvement: No (comment)  Admission diagnosis:  Acute respiratory failure with hypoxia ([place]) [J96.01] Patient Active Problem List   Diagnosis Date Noted   Bloodstream infection due to central venous catheter 02/02/2022   Pulmonary HTN ([place]) 02/02/2022   Hyponatremia 02/01/2022   Hypokalemia 02/01/2022   Community acquired pneumonia 02/01/2022   Acute on chronic right-sided congestive heart failure ([place]) 02/01/2022   Acute respiratory failure with hypoxia ([place]) 01/11/2022   Maceration of skin 10/10/2021   Chronic venous insufficiency 08/01/2021   Knee osteoarthritis 12/12/2020   Depression 09/02/2020   Left paraspinal back pain 05/05/2020   Hearing difficulty of both ears 05/05/2020   Internal hemorrhoid 01/11/2020   Fibroid, uterine 11/07/2019   Bleeding from the genitourinary system 09/13/2019   MSSA bacteremia 05/07/2019   Urinary incontinence 12/12/2018   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 [place] [address] Phone: [phone] Fax: [phone]  [place], [place] [address] [place] [address] Phone: [phone] Fax: [phone]  [place], [place] [address] Phone: [phone] Fax: [phone]  [place], [place] [employer] [address] Phone: [phone] Fax: [phone]     Social Determinants of Health (SDOH) Social History: [place]: Food Insecurity Present (11/05/2021)  Housing: Medium Risk (11/05/2021)  Transportation Needs: Unmet Transportation Needs (11/16/2021)  Utilities: Not At Risk (11/05/2021)  Alcohol Screen: Low Risk  (01/08/2021)  Depression (PHQ2-9): Low Risk  (11/05/2021)  Financial Resource Strain: Low Risk  (06/21/2018)  Physical Activity: Insufficiently Active (04/22/2021)  Social Connections: Socially Isolated (11/05/2021)  Stress: No Stress Concern Present (06/21/2018)  Tobacco Use: Low Risk  (01/12/2022)   SDOH Interventions:     Readmission Risk Interventions     No data to display

## 2022-02-03 ENCOUNTER — Inpatient Hospital Stay (HOSPITAL_COMMUNITY): Payer: Medicaid Other

## 2022-02-03 DIAGNOSIS — B9561 Methicillin susceptible Staphylococcus aureus infection as the cause of diseases classified elsewhere: Secondary | ICD-10-CM | POA: Diagnosis not present

## 2022-02-03 DIAGNOSIS — J189 Pneumonia, unspecified organism: Secondary | ICD-10-CM | POA: Diagnosis not present

## 2022-02-03 DIAGNOSIS — I272 Pulmonary hypertension, unspecified: Secondary | ICD-10-CM | POA: Diagnosis not present

## 2022-02-03 DIAGNOSIS — T80211D Bloodstream infection due to central venous catheter, subsequent encounter: Secondary | ICD-10-CM | POA: Diagnosis not present

## 2022-02-03 DIAGNOSIS — R7881 Bacteremia: Secondary | ICD-10-CM | POA: Diagnosis not present

## 2022-02-03 LAB — BASIC METABOLIC PANEL
Anion gap: 10 (ref 5–15)
Anion gap: 9 (ref 5–15)
Anion gap: 13 (ref 5–15)
BUN: 36 mg/dL — ABNORMAL HIGH (ref 6–20)
BUN: 34 mg/dL — ABNORMAL HIGH (ref 6–20)
BUN: 30 mg/dL — ABNORMAL HIGH (ref 6–20)
CO2: 31 mmol/L (ref 22–32)
CO2: 32 mmol/L (ref 22–32)
CO2: 32 mmol/L (ref 22–32)
Calcium: 8.1 mg/dL — ABNORMAL LOW (ref 8.9–10.3)
Calcium: 8.2 mg/dL — ABNORMAL LOW (ref 8.9–10.3)
Calcium: 8.1 mg/dL — ABNORMAL LOW (ref 8.9–10.3)
Chloride: 95 mmol/L — ABNORMAL LOW (ref 98–111)
Chloride: 94 mmol/L — ABNORMAL LOW (ref 98–111)
Chloride: 89 mmol/L — ABNORMAL LOW (ref 98–111)
Creatinine, Ser: 0.95 mg/dL (ref 0.44–1.00)
Creatinine, Ser: 0.93 mg/dL (ref 0.44–1.00)
Creatinine, Ser: 0.77 mg/dL (ref 0.44–1.00)
GFR, Estimated: >60 mL/min (ref >60)
GFR, Estimated: >60 mL/min (ref >60)
GFR, Estimated: >60 mL/min (ref >60)
Glucose, Bld: 114 mg/dL — ABNORMAL HIGH (ref 70–99)
Glucose, Bld: 136 mg/dL — ABNORMAL HIGH (ref 70–99)
Glucose, Bld: 186 mg/dL — ABNORMAL HIGH (ref 70–99)
Potassium: 4.1 mmol/L (ref 3.5–5.1)
Potassium: 3.9 mmol/L (ref 3.5–5.1)
Potassium: 3.2 mmol/L — ABNORMAL LOW (ref 3.5–5.1)
Sodium: 136 mmol/L (ref 135–145)
Sodium: 135 mmol/L (ref 135–145)
Sodium: 134 mmol/L — ABNORMAL LOW (ref 135–145)

## 2022-02-03 LAB — CULTURE, BLOOD (ROUTINE X 2): Special Requests: ADEQUATE

## 2022-02-03 LAB — GLUCOSE, CAPILLARY
Glucose-Capillary: 120 mg/dL — ABNORMAL HIGH (ref 70–99)
Glucose-Capillary: 123 mg/dL — ABNORMAL HIGH (ref 70–99)
Glucose-Capillary: 132 mg/dL — ABNORMAL HIGH (ref 70–99)
Glucose-Capillary: 143 mg/dL — ABNORMAL HIGH (ref 70–99)
Glucose-Capillary: 180 mg/dL — ABNORMAL HIGH (ref 70–99)
Glucose-Capillary: 152 mg/dL — ABNORMAL HIGH (ref 70–99)

## 2022-02-03 LAB — CBC
HCT: 39.5 % (ref 36.0–46.0)
Hemoglobin: 13.4 g/dL (ref 12.0–15.0)
MCH: 29.4 pg (ref 26.0–34.0)
MCHC: 33.9 g/dL (ref 30.0–36.0)
MCV: 86.6 fL (ref 80.0–100.0)
Platelets: 137 10*3/uL — ABNORMAL LOW (ref 150–400)
RBC: 4.56 MIL/uL (ref 3.87–5.11)
RDW: 17 % — ABNORMAL HIGH (ref 11.5–15.5)
WBC: 20.2 10*3/uL — ABNORMAL HIGH (ref 4.0–10.5)
nRBC: 0.3 % — ABNORMAL HIGH (ref 0.0–0.2)

## 2022-02-03 LAB — HEPARIN LEVEL (UNFRACTIONATED)
Heparin Unfractionated: 0.33 IU/mL (ref 0.30–0.70)
Heparin Unfractionated: 0.37 IU/mL (ref 0.30–0.70)

## 2022-02-03 LAB — PHOSPHORUS: Phosphorus: 3.7 mg/dL (ref 2.5–4.6)

## 2022-02-03 LAB — MAGNESIUM: Magnesium: 2.1 mg/dL (ref 1.7–2.4)

## 2022-02-03 MED ORDER — METOLAZONE 5 MG PO TABS
5.0000 mg | ORAL_TABLET | Freq: Once | ORAL | Status: AC
  Administered 2022-02-03: 5 mg via ORAL
  Filled 2022-02-03: qty 1

## 2022-02-03 MED ORDER — SODIUM CHLORIDE 0.9 % IV SOLN
2.0000 g | Freq: Three times a day (TID) | INTRAVENOUS | Status: DC
  Administered 2022-02-03 – 2022-02-05 (×6): 2 g via INTRAVENOUS
  Filled 2022-02-03 (×7): qty 12.5

## 2022-02-03 MED ORDER — METOLAZONE 5 MG PO TABS: 5.0000 mg | ORAL_TABLET | Freq: Two times a day (BID) | ORAL | Status: DC

## 2022-02-03 MED ORDER — ACETAZOLAMIDE 250 MG PO TABS
250.0000 mg | ORAL_TABLET | Freq: Once | ORAL | Status: AC
  Administered 2022-02-03: 250 mg via ORAL
  Filled 2022-02-03: qty 1

## 2022-02-03 MED ORDER — POTASSIUM CHLORIDE CRYS ER 10 MEQ PO TBCR
40.0000 meq | EXTENDED_RELEASE_TABLET | Freq: Once | ORAL | Status: AC
  Administered 2022-02-03: 40 meq via ORAL
  Filled 2022-02-03: qty 4

## 2022-02-03 NOTE — Progress Notes (Signed)
NAME:  [name], MRN:  [id number], DOB:  May 30, 1960, LOS: 3 ADMISSION DATE:  01/08/2022, CONSULTATION DATE:  01/30/2022 REFERRING MD:  Regenia Skeeter - EDP CHIEF COMPLAINT:  Acute hypoxic resp failure; CHF vs. COPD exacerbation   History of Present Illness:  62 year old woman who presented to [facility] 1/28 for respiratory failure. PMHx significant for chronic diastolic HF, cor pulmonale, severe pulmonary hypertension (on Remodulin and sildenafil), A-fib on Xarelto, OSA on CPAP presents to [facility] ED on 5/28 with SOB.  Patient states her breathing has worsened over the past few days.  States she is not very compliant with her medications at home.  States her breathing has gotten a lot more swollen over the past few days making it harder on her breathing.  On 1/28 her breathing was much worse and called EMS.  EMS found patient with sats 72% on room air and initially placed on O2 but required CPAP.  Upon arrival to [facility] ED on 1/28, patient in acute respiratory distress on BiPAP.  Initial BP 99/50.  RR 30s.  Afebrile but wbc 25.4 EKG with no signs of ischemia.  Given 80 IV Lasix. CXR w/ R basilar opacity. Given rocephin/azithro for possible cap.  Cultures obtained.  HF notified by ED physician for consult. PCCM consulted for ICU admission.  Pertinent ED labs: COVID/flu/RSV negative, NA 128, potassium 2.5, mag 1.8, creat 1.03, BNP 449,Troponin 371 troponin 371 then 373, VBG 7.46, 39, 50, 28.  Pertinent Medical History:   Past Medical History:  Diagnosis Date   Acute on chronic diastolic (congestive) heart failure ([place]) 06/25/2013   COPD (chronic obstructive pulmonary disease) (HCC)    Cor pulmonale (HCC)    PA Peak pressure 47mHg   Depression    Diabetes mellitus    well controlled on metformin   External hemorrhoid 01/11/2020   GERD (gastroesophageal reflux disease)    H/O mental retardation    Hyperlipidemia    Hypertension    OSA (obstructive sleep apnea)    CPAP   Pulmonary hypertension (HCC)     Restrictive lung disease    PFTs 06/2012 (FVC 54% predicted and FEV1 68% predicted w minimal bronchodilator response).   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 ulcer ([place]    chornic, ?followed up at wound care center, multiple courses of antibiotics in past for cellulitis, on lasix   Significant Hospital Events: Including procedures, antibiotic start and stop dates in addition to other pertinent events   1/28 admitted likely chf exacerbation on bipap 1/29 Remains on BiPAP, trialed HFNC but desaturated 1/30 Off of BiPAP in AM, tolerated HHFNC all day, fatigued in afternoon with desats 1/31 Off of BiPAP, less reserve today; sats ~85%. CXR with worsening bibasilar airspace disease,  L > R.  Interim History / Subjective:  No significant events overnight, tolerated BiPAP BiPAP off this AM; seems to have less reserve Remains on OptiFlow Taking shallow breaths, coughing but nonproductive CXR with worsening bilateral airspace disease (L > R), +air bronchograms D/w ID re: broadening abx Aggressive pulmonary toileting  Objective:  Blood pressure (!) 98/48, pulse 80, temperature 98.4 F (36.9 C), temperature source Axillary, resp. rate (!) 24, height '5\' 3"'$  (1.6 m), weight 111.3 kg, last menstrual period 02/19/2011, SpO2 (!) 88 %. CVP:  [8 mmHg-12 mmHg] 8 mmHg  FiO2 (%):  [100 %] 100 %   Intake/Output Summary (Last 24 hours) at 02/03/2022 [id number]  Last data filed at 02/03/2022 0700 Gross per 24 hour  Intake 2097.93 ml  Output 2950 ml  Net -852.07 ml    Filed Weights   02/01/22 0400 02/02/22 0500 02/03/22 0500  Weight: 116.4 kg 118.1 kg 111.3 kg   Physical Examination: General: Acute-on-chronically ill-appearing middle aged woman in NAD. Appears uncomfortable. HEENT: [place]/AT, anicteric sclera, PERRL, dry mucous membranes. Swishing/spitting into emesis bag. OptiFlow in place. Neuro: Awake, oriented  x 4. Responds to verbal stimuli. Following commands consistently. Moves all 4 extremities spontaneously. Generalized weakness. CV: RRR, no m/g/r. PULM: Tachypneic to mid 20s with mildly labored breathing on OptiFlow (60L, FiO2 100%). Lung fields with coarse rhonchi throughout, shallow breaths with only fair respiratory effort. +Conversational dyspnea. GI: Soft, nontender, nondistended. Normoactive bowel sounds. Extremities: Bilateral symmetric 1-2+ nonpitting LE edema noted. +Wraps in place. Skin: Warm/dry, no rashes.  Assessment & Plan:   Acute-on-chronic respiratory failure w/ hypoxia (On 4L O2) - Likely CHF exacerbation (patient states non compliant w/ medications). LE duplex negative. Echo 1/29 with EF 60-65%, G1DD, RV dilated with mod reduced systolic function, PAP 58, small pericardial effusion). Hx OSA on home CPAP Probable OHS - Continue HHFNC (OptiFlow) - Wean FiO2 for O2 sat > 88% - BiPAP PRN + QHS - Aggressive pulmonary toileting/hygiene (Chest PT, IS/flutter) - Diuresis based on CVP; goal net negative - CXR worse today 1/31, d/w ID re: broadening abx - Patient does not desire intubation  Acute on chronic diastolic CHF/cor pulmonale  Pulmonary HTN Group 1, II, III - Followed by Dr. Gilles Chiquito at [facility] Hx Afib on Xarelto, HLD Echo 1/29 with EF 60-65%, G1DD, D-shaped septum c/w volume overload/RV pressure, moderately elevated PASP - AHF team following, appreciate recs - Trend CVP (9-10 today, 1/31); Lasix to maintain single digit CVP/goal net negative fluid balance - Metolazone '5mg'$  x 1, Acetazolamide x 1 per AHF team - Continue home Remodulin, Sildenafil; discussion re: transition to PO, appreciate pharmacy assistance - Continue heparin gtt  MSSA bacteremia Echo 1/29 without vegetations. - ID following, appreciate assistance - Continue Ancef, consider broadening in the setting of worsening CXR - Follow Cx data - Unable to obtain TEE due to respiratory instability; likely would  not change treatment (treating as presumed endocarditis) - Ultimately needs line holiday; cannot tolerate currently due to instability  Abdominal distention - likely ascites from hypervolemia - Diuresis as above  Chronic venous insufficiency / venous stasis - LE Dopplers 1/29 negative for DVT - Unna boots  Hx DM - SSI - CBGs Q4H - Goal CBG 140-180  Hx GERD - PPI  Best Practice: (right click and "Reselect all SmartList Selections" daily)   Diet/type: NPO, sips with meds DVT prophylaxis: systemic heparin GI prophylaxis: PPI Lines: N/A Foley:  N/A Code Status:  full code Last date of multidisciplinary goals of care discussion [1/28 updated patient at bedside. Attempted to update son Legrand Como over phone but no answer]  Critical care time: 41 minutes   The patient is critically ill with multiple organ system failure and requires high complexity decision making for assessment and support, frequent evaluation and titration of therapies, advanced monitoring, review of radiographic studies and interpretation of complex data.   Critical Care Time devoted to patient care services, exclusive of separately billable procedures, described in this note is 41 minutes.  Lestine Mount, PA-C [place] 02/03/22 7:42 AM  Please see [email or web address] for pager details.  From 7A-7P if no response, please call [phone] After hours, please call ELink  [phone] 

## 2022-02-03 NOTE — Progress Notes (Addendum)
Advanced Heart Failure Rounding Note  PCP-Cardiologist: Dr. Haroldine Laws    Subjective:    Required BiPAP overnight night. Now back on heated high flow O2, 60 L/min.   Coughing up frothy sputum. 3L in UOP yesterday but only net negative 850 cc for the day.  CVP ~7. CXR today w/ worsening bibasilar airspace disease, left greater than right.   On Ancef for suspected aspiration PNA and bacteremia. BCx from 1/28 + for Staph aureus. Echo (02/01/22) with EF 60-65%, D-shaped septum, moderate RV enlargement with moderately decreased systolic function, PASP 59, dilated IVC, no definite valvular vegetation.   WBC trending down 25>>20K. PCT 2.02.   On treprostinil + sildenafil for PH.   Scr and K stable w/ diuresis    Objective:   Weight Range: 111.3 kg Body mass index is 43.47 kg/m.   Vital Signs:   Temp:  [97.4 F (36.3 C)-98.5 F (36.9 C)] 97.6 F (36.4 C) (01/31 0745) Pulse Rate:  [73-90] 88 (01/31 0800) Resp:  [17-35] 25 (01/31 0800) BP: (92-118)/(48-61) 101/57 (01/31 0800) SpO2:  [82 %-90 %] 84 % (01/31 0800) FiO2 (%):  [100 %] 100 % (01/31 0736) Weight:  [111.3 kg] 111.3 kg (01/31 0500) Last BM Date : 02/02/22  Weight change: Filed Weights   02/01/22 0400 02/02/22 0500 02/03/22 0500  Weight: 116.4 kg 118.1 kg 111.3 kg    Intake/Output:   Intake/Output Summary (Last 24 hours) at 02/03/2022 0901 Last data filed at 02/03/2022 0800 Gross per 24 hour  Intake 2067.05 ml  Output 2950 ml  Net -882.95 ml      Physical Exam    CVP 7  General:  obese, chronically ill appearing on high flow [place], coughing + frothy sputum  HEENT: Normal Neck: Supple. Thick neck, JVD not well visualized. Carotids 2+ bilat; no bruits. No lymphadenopathy or thyromegaly appreciated. Cor: PMI nondisplaced. Regular rate & rhythm. No rubs, gallops or murmurs. Lungs: decreased BS at the bases bilaterally  Abdomen: obese, soft, nontender, nondistended. No hepatosplenomegaly. No bruits or masses.  Good bowel sounds. Extremities: No cyanosis, clubbing, rash, trace b/l LE edema,  +unna boots  Neuro: Alert & orientedx3, cranial nerves grossly intact. moves all 4 extremities w/o difficulty. Affect pleasant   Telemetry   NSR 90s   EKG    N/A   Labs    CBC Recent Labs    01/26/2022 1940 01/30/2022 1950 02/02/22 0452 02/03/22 0430  WBC 25.4*   < > 25.2* 20.2*  NEUTROABS 22.6*  --   --   --   HGB 14.1   < > 13.1 13.4  HCT 41.3   < > 39.0 39.5  MCV 84.1   < > 85.7 86.6  PLT 186   < > 147* 137*   < > = values in this interval not displayed.   Basic Metabolic Panel Recent Labs    02/02/22 0625 02/02/22 1414 02/02/22 2137 02/03/22 0430  NA 136   < > 136 136  K 3.5   < > 3.2* 4.1  CL 97*   < > 96* 95*  CO2 28   < > 31 31  GLUCOSE 143*   < > 148* 114*  BUN 42*   < > 39* 36*  CREATININE 0.99   < > 0.94 0.95  CALCIUM 8.3*   < > 8.2* 8.1*  MG 2.2  --   --  2.1  PHOS 3.9  --   --  3.7   < > =  values in this interval not displayed.   Liver Function Tests Recent Labs    01/30/2022 1940  AST 34  ALT 18  ALKPHOS 67  BILITOT 1.3*  PROT 6.3*  ALBUMIN 2.8*   No results for input(s): "LIPASE", "AMYLASE" in the last 72 hours. Cardiac Enzymes No results for input(s): "CKTOTAL", "CKMB", "CKMBINDEX", "TROPONINI" in the last 72 hours.  BNP: BNP (last 3 results) Recent Labs    07/02/21 2136 01/30/2022 1940  BNP 29.1 449.8*    ProBNP (last 3 results) No results for input(s): "PROBNP" in the last 8760 hours.   D-Dimer Recent Labs    01/09/2022 1940  DDIMER 2.68*   Hemoglobin A1C Recent Labs    02/01/22 0241  HGBA1C 6.1*   Fasting Lipid Panel No results for input(s): "CHOL", "HDL", "LDLCALC", "TRIG", "CHOLHDL", "LDLDIRECT" in the last 72 hours. Thyroid Function Tests No results for input(s): "TSH", "T4TOTAL", "T3FREE", "THYROIDAB" in the last 72 hours.  Invalid input(s): "FREET3"  Other results:   Imaging    No results found.   Medications:      Scheduled Medications:  Chlorhexidine Gluconate Cloth  6 each Topical Daily   Chlorhexidine Gluconate Cloth  6 each Topical Daily   furosemide  80 mg Intravenous BID   gabapentin  600 mg Oral TID   insulin aspart  0-9 Units Subcutaneous Q4H   mouth rinse  15 mL Mouth Rinse 4 times per day   pantoprazole  20 mg Oral BID   pravastatin  20 mg Oral Daily   sildenafil  20 mg Oral TID   sodium chloride flush  10-40 mL Intracatheter Q12H    Infusions:  sodium chloride Stopped (02/01/22 1154)    ceFAZolin (ANCEF) IV Stopped (02/03/22 0539)   heparin 2,350 Units/hr (02/03/22 0800)   treprostinil (REMODULIN) 35,000,000 ng in pH 12 sterile diluent 100 mL (350,000 ng/mL) infusion 81 ng/kg/min (02/03/22 0800)    PRN Medications: acetaminophen, docusate sodium, Gerhardt's butt cream, ipratropium-albuterol, ondansetron (ZOFRAN) IV, mouth rinse, phenol, polyethylene glycol, sodium chloride flush    Patient Profile   [name] is a 62 y.o. female with a hx of morbid obesity, cognitive impairment, J6BH, chronic diastolic HF, pAF (Xarelto), severe pulmonary HTN (on combination therapy with treprostinil and sildenafil) who is being seen today for the evaluation of chronic HF and PH therapies in setting of acute hypoxic respiratory failure requiring CICU admission.   Assessment/Plan   1. Acute on chronic hypoxemic respiratory failure: Baseline OHS/OSA on 4L home oxygen. Currently on Bipap.  She is being treating for community-acquired PNA on a baseline of pulmonary hypertension, OHS/OSA, COPD as well as RV failure.  - Continue abx per CCM.  - Push diuresis today, continue IV Lasix 80 mg bid and increase metolazone to 5 mg x 1 - Continues on home IV treprostinil and sildenafil.  2. Pulmonary hypertension: Suspect mixed group 1, 2, and 3 (OHS/OSA, COPD).   [facility] is [facility] at [facility].  - Continue home IV treprostinil.  - Continue sildenafil 20 tid.  3. RV failure: Echo in 6/23 with EF > 55%,  moderate RV dysfunction. Echo this admission with EF 60-65%, D-shaped septum, RV moderately enlarged with moderately decreased systolic function, PASP 59. CVP 7. Suspect pulmonary edema based CXR and +frothy sputum.   - Push diuresis today, continue IV Lasix 80 mg bid and increase metolazone to 5 mg x 1 - may need digoxin for RV support  - BP too soft for other GDMT - continue sildenafil  4. PNA: Suspect community-acquired PNA with PCT 2.02 and WBCs 25.  RLL infiltrate on CXR.  - Continue Ancef per CCM 5. Atrial fibrillation: Paroxysmal, on Xarelto at home.  She is in NSR.  - Continue heparin gtt for now.  6. Elevated Troponin: HS-TnI 371 => 299.  Suspect demand ischemia from hypoxemia and volume overload.  HS-TnI in 1000s range in 2021, cath at that time showed minimal CAD. 7. Bacteremia: BCx from 1/28 + for staph aureus  - on Ancef per CCM - will need tunneled PICC removed. Per CCM, currently too unstable from respiratory standpoint to go to IR. Plan removal once on lower O2 requirements   - TTE this admit w/ no clear vegetations  - will need TEE once improved from respiratory standpoint    Length of Stay: [address], PA-C  02/03/2022, 9:01 AM  Advanced Heart Failure Team Pager [phone] (M-F; 7a - 5p)  Please contact [facility] for night-coverage after hours (5p -7a ) and weekends on [email or web address]  Patient seen with PA, agree with the above note.   Still with high oxygen requirement, remains on HFNC.  I/Os mildly negative yesterday.  CVP 14 on my read.  SBP 90s-100s.   She is on cefazolin for MSSA bacteremia with concern for line infection and PNA.   General: NAD, on HFNC Neck: JVP 14 cm, no thyromegaly or thyroid nodule.  Lungs: Distant BS.  CV: Nondisplaced PMI.  Heart regular S1/S2, no S3/S4, no murmur. 1+ ankle edema.  Abdomen: Soft, nontender, no hepatosplenomegaly, no distention.  Skin: Intact without lesions or rashes.  Neurologic: Alert and oriented x 3.   Psych: Normal affect. Extremities: No clubbing or cyanosis.  HEENT: Normal.   RV failure/volume overload, will give Lasix 80 mg IV bid again with metolazone 5 mg x 1.  Will also give a dose of acetazolamide.  Creatinine stable.   She continues to get her IV treprostinil and sildenafil.   MSSA bacteremia, concern for line infection but also PNA.  Needs tunneled catheter out but too unstable currently requiring high level of oxygen and orthopnea.   Would not do TEE (high risk procedure in this setting), per ID will treat like endocarditis regardless.   She is DNR/DNI.   CRITICAL CARE Performed by: Loralie Champagne  Total critical care time: 40 minutes  Critical care time was exclusive of separately billable procedures and treating other patients.  Critical care was necessary to treat or prevent imminent or life-threatening deterioration.  Critical care was time spent personally by me on the following activities: development of treatment plan with patient and/or surrogate as well as nursing, discussions with consultants, evaluation of patient's response to treatment, examination of patient, obtaining history from patient or surrogate, ordering and performing treatments and interventions, ordering and review of laboratory studies, ordering and review of radiographic studies, pulse oximetry and re-evaluation of patient's condition.  Loralie Champagne 02/03/2022 10:24 AM

## 2022-02-03 NOTE — Progress Notes (Addendum)
[place] for Heparin (Xarelto on hold) Indication: atrial fibrillation  Allergies  Allergen Reactions   Aspirin Swelling    REACTION: airway swelling   Codeine Other (See Comments)    REACTION: tingling in lips and hard breathing - had reaction at dentist - states "I can't take certain kinds of codeine" - happened maybe 10 yr ago   Lisinopril Cough   Sulfonamide Derivatives Swelling    REACTION: airway swelling   Latex Rash   Patient Measurements: Height: '5\' 3"'$  (160 cm) Weight: 111.3 kg (245 lb 6 oz) IBW/kg (Calculated) : 52.4 Heparin dosing wt: 81 kg  Vital Signs: Temp: 98.4 F (36.9 C) (01/31 0400) Temp Source: Axillary (01/31 0400) BP: 98/48 (01/31 0700) Pulse Rate: 78 (01/31 0700)  Labs: Recent Labs    02/01/22 0241 02/01/22 0429 02/01/22 0827 02/01/22 0920 02/01/22 1152 02/02/22 0452 02/02/22 0625 02/02/22 1414 02/02/22 2137 02/03/22 0430  HGB 13.8 14.3  --   --   --  13.1  --   --   --  13.4  HCT 39.8 42.0  --   --   --  39.0  --   --   --  39.5  PLT 164  --   --   --   --  147*  --   --   --  137*  HEPARINUNFRC  --   --   --   --    < > 0.10*  --  0.13* 0.18* 0.33  CREATININE 1.01*  --  1.09*  --    < >  --    < > 0.98 0.94 0.95  TROPONINIHS 299*  --  204* 188*  --   --   --   --   --   --    < > = values in this interval not displayed.     Estimated Creatinine Clearance: 75.6 mL/min (by C-G formula based on SCr of 0.95 mg/dL).   Assessment: 62 y/o F with HF and PAH presents to the ED with shortness breath. She is on Xarelto PTA for afib. Holding Xarelto and starting Heparin. The patient states she hasn't taken her Xarelto in over one week.   Heparin level is therapeutic (0.33) on infusion at 2350 units/hr. Hgb 13s, plt down 137. No issues with line or bleeding reported per RN.   Goal of Therapy:  Heparin level 0.3-0.7 units/ml Monitor platelets by anticoagulation protocol: Yes   Plan:  Continue heparin gtt  at 2350 units/hr F/u 6 hour confirmatory heparin level Monitor daily HL, CBC, and for s/sx of bleeding   Antonietta Jewel, PharmD, [place] Pharmacist  Phone: [phone] 02/03/2022 7:35 AM  Please check AMION for all [name] phone numbers After 10:00 PM, call [place] [phone]   ADDENDUM Heparin level came back therapeutic at 0.37 on confirmation, on heparin at 2350 units/hr. No s/sx of bleeding or infusion issues. Continue at same rate and get level with AM labs.   Antonietta Jewel, PharmD, [place] Clinical Pharmacist

## 2022-02-03 NOTE — Progress Notes (Signed)
[facility] for Infectious Disease  Date of Admission:  01/25/2022      Total days of antibiotics 3   Cefazolin 1/29 >> c           ASSESSMENT: [name] is a 62 y.o. female with MSSA bacteremia admitted for a/c respiratory failure. 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 She has persistently positive blood cultures pointing towards line infection (which is still in place) vs endocarditis.  She is not to the point where we can safely remove line d/t requirement to lay flat. Can't do TEE given her respiratory status and high risk for intubation and would favor empiric course of endocarditis treatment.   Central line holiday when safely feasible per PCCM/AHF team - she has persistent bacteremia, more favoring line related sepsis vs endocarditis.   A/C respiratory failure w/o improvement in oxygen requirements and worsening cxr - D/W PCCM team and agree with broadening to cefepime for now to see if this helps. Remains very tenuous, DNI wishes acknowledged.   Continue cefazolin for tx - duration tentatively 6w following line removal unless we can determine no IE. For replacement line, she will probably need dual lumen given continuous PAH IV admin.   No new concern for metastatic sites of infection today    PLAN: Start Cefepime for 5-7d course to cover HCAP  Follow timing for line removal / holiday Repeat BCx after #2  Follow for any further evidence of metastatic infection.     Principal Problem:   MSSA bacteremia Active Problems:   Acute respiratory failure with hypoxia (HCC)   Hyponatremia   Hypokalemia   Community acquired pneumonia   Acute on chronic right-sided congestive heart failure (HCC)   Bloodstream infection due to central venous catheter   Pulmonary HTN (HCC)    Chlorhexidine Gluconate Cloth  6 each Topical Daily   Chlorhexidine Gluconate Cloth  6 each Topical Daily    furosemide  80 mg Intravenous BID   gabapentin  600 mg Oral TID   insulin aspart  0-9 Units Subcutaneous Q4H   mouth rinse  15 mL Mouth Rinse 4 times per day   pantoprazole  20 mg Oral BID   pravastatin  20 mg Oral Daily   sildenafil  20 mg Oral TID   sodium chloride flush  10-40 mL Intracatheter Q12H    SUBJECTIVE: Feeling a little better. Had one liquid BM yesterday but nothing further. No abd pain or discomfort. Still on high dose [place]. Cannot lay flat.    Review of Systems: Review of Systems  Constitutional:  Positive for malaise/fatigue. Negative for chills and fever.  Respiratory:  Positive for sputum production and shortness of breath. Negative for cough.   Cardiovascular:  Negative for chest pain, palpitations and leg swelling.  Genitourinary:  Negative for dysuria.  Musculoskeletal:  Negative for back pain.  Neurological:  Negative for dizziness.      Allergies  Allergen Reactions   Aspirin Swelling    REACTION: airway swelling   Codeine Other (See Comments)    REACTION: tingling in lips and hard breathing - had reaction at dentist - states "I can't take certain kinds of codeine" - happened maybe 10 yr ago   Lisinopril Cough   Sulfonamide Derivatives Swelling    REACTION: airway swelling   Latex Rash    OBJECTIVE: Vitals:   02/03/22 1000 02/03/22 1100 02/03/22 1139 02/03/22 1200  BP: (!) 92/47 (!) 96/52  (!) 89/48  Pulse: 85 80  90  Resp: 17 (!) 21  (!) 33  Temp:   98.5 F (36.9 C)   TempSrc:   Oral   SpO2: (!) 88% (!) 86%  (!) 85%  Weight:      Height:       Body mass index is 43.47 kg/m.   Physical Exam Constitutional:      Appearance: She is obese. She is ill-appearing.  Cardiovascular:     Rate and Rhythm: Normal rate.  Pulmonary:     Effort: Tachypnea present.     Breath sounds: Wheezing present.  Chest:     Comments: Old scab on right chest from previous CL removal  Skin:    General: Skin is warm and dry.     Capillary Refill:  Capillary refill takes less than 2 seconds.  Neurological:     Mental Status: She is alert and oriented to person, place, and time.      Lab Results Lab Results  Component Value Date   WBC 20.2 (H) 02/03/2022   HGB 13.4 02/03/2022   HCT 39.5 02/03/2022   MCV 86.6 02/03/2022   PLT 137 (L) 02/03/2022    Lab Results  Component Value Date   CREATININE 0.95 02/03/2022   BUN 36 (H) 02/03/2022   NA 136 02/03/2022   K 4.1 02/03/2022   CL 95 (L) 02/03/2022   CO2 31 02/03/2022    Lab Results  Component Value Date   ALT 18 01/22/2022   AST 34 02/03/2022   ALKPHOS 67 01/26/2022   BILITOT 1.3 (H) 01/30/2022     Microbiology: Recent Results (from the past 240 hour(s))  Urine Culture (for pregnant, neutropenic or urologic patients or patients with an indwelling urinary catheter)     Status: Abnormal   Collection Time: 01/14/2022 12:57 AM   Specimen: Urine, Clean Catch  Result Value Ref Range Status   Specimen Description URINE, CLEAN CATCH  Final   Special Requests   Final    NONE Performed at [facility], [place] [address] [place], [place] [address]    Culture MULTIPLE SPECIES PRESENT, SUGGEST RECOLLECTION (A)  Final   Report Status 02/02/2022 FINAL  Final  Resp panel by RT-PCR (RSV, Flu A&B, Covid) Anterior Nasal Swab     Status: None   Collection Time: 01/27/2022  7:33 PM   Specimen: Anterior Nasal Swab  Result Value Ref Range Status   SARS Coronavirus 2 by RT PCR NEGATIVE NEGATIVE Final    Comment: (NOTE) SARS-CoV-2 target nucleic acids are NOT DETECTED.  The SARS-CoV-2 RNA is generally detectable in upper respiratory specimens during the acute phase of infection. The lowest concentration of SARS-CoV-2 viral copies this assay can detect is 138 copies/mL. A negative result does not preclude SARS-Cov-2 infection and should not be used as the sole basis for treatment or other patient management decisions. A negative result may occur with  improper specimen  collection/handling, submission of specimen other than nasopharyngeal swab, presence of viral mutation(s) within the areas targeted by this assay, and inadequate number of viral copies(<138 copies/mL). A negative result must be combined with clinical observations, patient history, and epidemiological information. The expected result is Negative.  Fact Sheet for Patients:  EntrepreneurPulse.com.au  Fact Sheet for Healthcare Providers:  IncredibleEmployment.be  This test is no t yet approved or cleared by the [place] FDA and  has been authorized for detection and/or diagnosis of SARS-CoV-2 by  FDA under an Emergency Use Authorization (EUA). This EUA will remain  in effect (meaning this test can be used) for the duration of the COVID-19 declaration under Section 564(b)(1) of the Act, 21 U.S.C.section 360bbb-3(b)(1), unless the authorization is terminated  or revoked sooner.       Influenza A by PCR NEGATIVE NEGATIVE Final   Influenza B by PCR NEGATIVE NEGATIVE Final    Comment: (NOTE) The Xpert Xpress SARS-CoV-2/FLU/RSV plus assay is intended as an aid in the diagnosis of influenza from Nasopharyngeal swab specimens and should not be used as a sole basis for treatment. Nasal washings and aspirates are unacceptable for Xpert Xpress SARS-CoV-2/FLU/RSV testing.  Fact Sheet for Patients: EntrepreneurPulse.com.au  Fact Sheet for Healthcare Providers: IncredibleEmployment.be  This test is not yet approved or cleared by the [place] FDA and has been authorized for detection and/or diagnosis of SARS-CoV-2 by FDA under an Emergency Use Authorization (EUA). This EUA will remain in effect (meaning this test can be used) for the duration of the COVID-19 declaration under Section 564(b)(1) of the Act, 21 U.S.C. section 360bbb-3(b)(1), unless the authorization is terminated or revoked.     Resp Syncytial  Virus by PCR NEGATIVE NEGATIVE Final    Comment: (NOTE) Fact Sheet for Patients: EntrepreneurPulse.com.au  Fact Sheet for Healthcare Providers: IncredibleEmployment.be  This test is not yet approved or cleared by the [place] FDA and has been authorized for detection and/or diagnosis of SARS-CoV-2 by FDA under an Emergency Use Authorization (EUA). This EUA will remain in effect (meaning this test can be used) for the duration of the COVID-19 declaration under Section 564(b)(1) of the Act, 21 U.S.C. section 360bbb-3(b)(1), unless the authorization is terminated or revoked.  Performed at [facility], [place] [address], [place], [place] [address]   Blood culture (routine x 2)     Status: Abnormal   Collection Time: 01/16/2022  7:40 PM   Specimen: BLOOD  Result Value Ref Range Status   Specimen Description BLOOD RIGHT ANTECUBITAL  Final   Special Requests   Final    BOTTLES DRAWN AEROBIC AND ANAEROBIC Blood Culture adequate volume   Culture  Setup Time   Final    GRAM POSITIVE COCCI IN CLUSTERS IN BOTH AEROBIC AND ANAEROBIC BOTTLES CRITICAL RESULT CALLED TO, READ BACK BY AND VERIFIED WITH: PHARMD E.MARTIN AT 5284 02/01/2022 BY T.SAAD. Performed at [facility], [place] [address] [place], [place] [address]    Culture STAPHYLOCOCCUS AUREUS (A)  Final   Report Status 02/03/2022 FINAL  Final   Organism ID, Bacteria STAPHYLOCOCCUS AUREUS  Final      Susceptibility   Staphylococcus aureus - MIC*    CIPROFLOXACIN <=0.5 SENSITIVE Sensitive     ERYTHROMYCIN >=8 RESISTANT Resistant     GENTAMICIN <=0.5 SENSITIVE Sensitive     OXACILLIN <=0.25 SENSITIVE Sensitive     TETRACYCLINE <=1 SENSITIVE Sensitive     VANCOMYCIN 1 SENSITIVE Sensitive     TRIMETH/SULFA <=10 SENSITIVE Sensitive     CLINDAMYCIN RESISTANT Resistant     RIFAMPIN <=0.5 SENSITIVE Sensitive     Inducible Clindamycin POSITIVE Resistant     * STAPHYLOCOCCUS AUREUS  Blood  Culture ID Panel (Reflexed)     Status: Abnormal   Collection Time: 01/07/2022  7:40 PM  Result Value Ref Range Status   Enterococcus faecalis NOT DETECTED NOT DETECTED Final   Enterococcus Faecium NOT DETECTED NOT DETECTED Final   Listeria monocytogenes NOT DETECTED NOT DETECTED Final   Staphylococcus species  DETECTED (A) NOT DETECTED Final    Comment: CRITICAL RESULT CALLED TO, READ BACK BY AND VERIFIED WITH: PHARMD E.MARTIN AT 1130 02/01/2022 BY T.SAAD.    Staphylococcus aureus (BCID) DETECTED (A) NOT DETECTED Final    Comment: CRITICAL RESULT CALLED TO, READ BACK BY AND VERIFIED WITH: PHARMD E.MARTIN AT 1130 02/01/2022 BY T.SAAD.    Staphylococcus epidermidis NOT DETECTED NOT DETECTED Final   Staphylococcus lugdunensis NOT DETECTED NOT DETECTED Final   Streptococcus species NOT DETECTED NOT DETECTED Final   Streptococcus agalactiae NOT DETECTED NOT DETECTED Final   Streptococcus pneumoniae NOT DETECTED NOT DETECTED Final   Streptococcus pyogenes NOT DETECTED NOT DETECTED Final   A.calcoaceticus-baumannii NOT DETECTED NOT DETECTED Final   Bacteroides fragilis NOT DETECTED NOT DETECTED Final   Enterobacterales NOT DETECTED NOT DETECTED Final   Enterobacter cloacae complex NOT DETECTED NOT DETECTED Final   Escherichia coli NOT DETECTED NOT DETECTED Final   Klebsiella aerogenes NOT DETECTED NOT DETECTED Final   Klebsiella oxytoca NOT DETECTED NOT DETECTED Final   Klebsiella pneumoniae NOT DETECTED NOT DETECTED Final   Proteus species NOT DETECTED NOT DETECTED Final   Salmonella species NOT DETECTED NOT DETECTED Final   Serratia marcescens NOT DETECTED NOT DETECTED Final   Haemophilus influenzae NOT DETECTED NOT DETECTED Final   Neisseria meningitidis NOT DETECTED NOT DETECTED Final   Pseudomonas aeruginosa NOT DETECTED NOT DETECTED Final   Stenotrophomonas maltophilia NOT DETECTED NOT DETECTED Final   Candida albicans NOT DETECTED NOT DETECTED Final   Candida auris NOT DETECTED NOT  DETECTED Final   Candida glabrata NOT DETECTED NOT DETECTED Final   Candida krusei NOT DETECTED NOT DETECTED Final   Candida parapsilosis NOT DETECTED NOT DETECTED Final   Candida tropicalis NOT DETECTED NOT DETECTED Final   Cryptococcus neoformans/gattii NOT DETECTED NOT DETECTED Final   Meth resistant mecA/C and MREJ NOT DETECTED NOT DETECTED Final    Comment: Performed at [facility], [address], [place], [place] [address]  Blood culture (routine x 2)     Status: None (Preliminary result)   Collection Time: 01/12/2022  8:41 PM   Specimen: BLOOD RIGHT HAND  Result Value Ref Range Status   Specimen Description BLOOD RIGHT HAND  Final   Special Requests   Final    BOTTLES DRAWN AEROBIC AND ANAEROBIC Blood Culture adequate volume   Culture   Final    NO GROWTH 3 DAYS Performed at [facility], [place] [address], [place], [place] [address]    Report Status PENDING  Incomplete  MRSA Next Gen by PCR, Nasal     Status: None   Collection Time: 02/01/22 12:38 AM   Specimen: Nasal Mucosa; Nasal Swab  Result Value Ref Range Status   MRSA by PCR Next Gen NOT DETECTED NOT DETECTED Final    Comment: (NOTE) The GeneXpert MRSA Assay (FDA approved for NASAL specimens only), is one component of a comprehensive MRSA colonization surveillance program. It is not intended to diagnose MRSA infection nor to guide or monitor treatment for MRSA infections. Test performance is not FDA approved in patients less than 67 years old. Performed at [facility], [place] [address] [place], [place] [address]   Culture, blood (Routine X 2) w Reflex to ID Panel     Status: Abnormal (Preliminary result)   Collection Time: 02/01/22  4:53 PM   Specimen: BLOOD  Result Value Ref Range Status   Specimen Description BLOOD SITE NOT SPECIFIED  Final   Special  Requests   Final    BOTTLES DRAWN AEROBIC AND ANAEROBIC Blood Culture adequate volume   Culture  Setup Time   Final    GRAM POSITIVE COCCI IN  CLUSTERS IN PEDIATRIC BOTTLE CRITICAL RESULT CALLED TO, READ BACK BY AND VERIFIED WITH: PHARMD CAREN AMEND ON 02/02/22 @ 2125 BY DRT    Culture (A)  Final    STAPHYLOCOCCUS AUREUS SUSCEPTIBILITIES TO FOLLOW Performed at [facility], [place] [address], [place], [place] [address]    Report Status PENDING  Incomplete  Blood Culture ID Panel (Reflexed)     Status: Abnormal   Collection Time: 02/01/22  4:53 PM  Result Value Ref Range Status   Enterococcus faecalis NOT DETECTED NOT DETECTED Final   Enterococcus Faecium NOT DETECTED NOT DETECTED Final   Listeria monocytogenes NOT DETECTED NOT DETECTED Final   Staphylococcus species DETECTED (A) NOT DETECTED Final    Comment: CRITICAL RESULT CALLED TO, READ BACK BY AND VERIFIED WITH: PHARMD CAREN AMEND ON 02/02/22 @ 2125 BY DRT    Staphylococcus aureus (BCID) DETECTED (A) NOT DETECTED Final    Comment: CRITICAL RESULT CALLED TO, READ BACK BY AND VERIFIED WITH: PHARMD CAREN AMEND ON 02/02/22 @ 2125 BY DRT    Staphylococcus epidermidis NOT DETECTED NOT DETECTED Final   Staphylococcus lugdunensis NOT DETECTED NOT DETECTED Final   Streptococcus species NOT DETECTED NOT DETECTED Final   Streptococcus agalactiae NOT DETECTED NOT DETECTED Final   Streptococcus pneumoniae NOT DETECTED NOT DETECTED Final   Streptococcus pyogenes NOT DETECTED NOT DETECTED Final   A.calcoaceticus-baumannii NOT DETECTED NOT DETECTED Final   Bacteroides fragilis NOT DETECTED NOT DETECTED Final   Enterobacterales NOT DETECTED NOT DETECTED Final   Enterobacter cloacae complex NOT DETECTED NOT DETECTED Final   Escherichia coli NOT DETECTED NOT DETECTED Final   Klebsiella aerogenes NOT DETECTED NOT DETECTED Final   Klebsiella oxytoca NOT DETECTED NOT DETECTED Final   Klebsiella pneumoniae NOT DETECTED NOT DETECTED Final   Proteus species NOT DETECTED NOT DETECTED Final   Salmonella species NOT DETECTED NOT DETECTED Final   Serratia marcescens NOT DETECTED NOT  DETECTED Final   Haemophilus influenzae NOT DETECTED NOT DETECTED Final   Neisseria meningitidis NOT DETECTED NOT DETECTED Final   Pseudomonas aeruginosa NOT DETECTED NOT DETECTED Final   Stenotrophomonas maltophilia NOT DETECTED NOT DETECTED Final   Candida albicans NOT DETECTED NOT DETECTED Final   Candida auris NOT DETECTED NOT DETECTED Final   Candida glabrata NOT DETECTED NOT DETECTED Final   Candida krusei NOT DETECTED NOT DETECTED Final   Candida parapsilosis NOT DETECTED NOT DETECTED Final   Candida tropicalis NOT DETECTED NOT DETECTED Final   Cryptococcus neoformans/gattii NOT DETECTED NOT DETECTED Final   Meth resistant mecA/C and MREJ NOT DETECTED NOT DETECTED Final    Comment: Performed at [facility], [address], [place], [place] [address]    Janene Madeira, MSN, NP-C [facility] for Infectious Disease [email or web address] Pager: [phone] Office: [phone] [facility] [address] Line: [place] Communication Welcome

## 2022-02-03 NOTE — Progress Notes (Signed)
Placed patient on bipap for the night

## 2022-02-04 ENCOUNTER — Inpatient Hospital Stay (HOSPITAL_COMMUNITY): Payer: Medicaid Other

## 2022-02-04 ENCOUNTER — Ambulatory Visit: Payer: Medicaid Other | Admitting: Occupational Therapy

## 2022-02-04 DIAGNOSIS — Z515 Encounter for palliative care: Secondary | ICD-10-CM | POA: Diagnosis not present

## 2022-02-04 DIAGNOSIS — J9601 Acute respiratory failure with hypoxia: Secondary | ICD-10-CM | POA: Diagnosis not present

## 2022-02-04 DIAGNOSIS — T80211D Bloodstream infection due to central venous catheter, subsequent encounter: Secondary | ICD-10-CM | POA: Diagnosis not present

## 2022-02-04 DIAGNOSIS — J189 Pneumonia, unspecified organism: Secondary | ICD-10-CM | POA: Diagnosis not present

## 2022-02-04 DIAGNOSIS — B9561 Methicillin susceptible Staphylococcus aureus infection as the cause of diseases classified elsewhere: Secondary | ICD-10-CM | POA: Diagnosis not present

## 2022-02-04 DIAGNOSIS — I272 Pulmonary hypertension, unspecified: Secondary | ICD-10-CM | POA: Diagnosis not present

## 2022-02-04 DIAGNOSIS — R7881 Bacteremia: Secondary | ICD-10-CM | POA: Diagnosis not present

## 2022-02-04 DIAGNOSIS — Z7189 Other specified counseling: Secondary | ICD-10-CM | POA: Diagnosis not present

## 2022-02-04 HISTORY — PX: IR REMOVAL TUN CV CATH W/O FL: IMG2289

## 2022-02-04 LAB — CULTURE, BLOOD (ROUTINE X 2): Special Requests: ADEQUATE

## 2022-02-04 LAB — CBC
HCT: 38.7 % (ref 36.0–46.0)
Hemoglobin: 13.2 g/dL (ref 12.0–15.0)
MCH: 29.5 pg (ref 26.0–34.0)
MCHC: 34.1 g/dL (ref 30.0–36.0)
MCV: 86.6 fL (ref 80.0–100.0)
Platelets: 115 10*3/uL — ABNORMAL LOW (ref 150–400)
RBC: 4.47 MIL/uL (ref 3.87–5.11)
RDW: 17.2 % — ABNORMAL HIGH (ref 11.5–15.5)
WBC: 22.8 10*3/uL — ABNORMAL HIGH (ref 4.0–10.5)
nRBC: 0.1 % (ref 0.0–0.2)

## 2022-02-04 LAB — COMPREHENSIVE METABOLIC PANEL
ALT: 10 U/L (ref 0–44)
AST: 27 U/L (ref 15–41)
Albumin: 2.4 g/dL — ABNORMAL LOW (ref 3.5–5.0)
Alkaline Phosphatase: 56 U/L (ref 38–126)
Anion gap: 10 (ref 5–15)
BUN: 27 mg/dL — ABNORMAL HIGH (ref 6–20)
CO2: 31 mmol/L (ref 22–32)
Calcium: 8.2 mg/dL — ABNORMAL LOW (ref 8.9–10.3)
Chloride: 92 mmol/L — ABNORMAL LOW (ref 98–111)
Creatinine, Ser: 0.81 mg/dL (ref 0.44–1.00)
GFR, Estimated: >60 mL/min (ref >60)
Glucose, Bld: 176 mg/dL — ABNORMAL HIGH (ref 70–99)
Potassium: 3.6 mmol/L (ref 3.5–5.1)
Sodium: 133 mmol/L — ABNORMAL LOW (ref 135–145)
Total Bilirubin: 0.8 mg/dL (ref 0.3–1.2)
Total Protein: 5.7 g/dL — ABNORMAL LOW (ref 6.5–8.1)

## 2022-02-04 LAB — BASIC METABOLIC PANEL
Anion gap: 7 (ref 5–15)
BUN: 26 mg/dL — ABNORMAL HIGH (ref 6–20)
CO2: 36 mmol/L — ABNORMAL HIGH (ref 22–32)
Calcium: 8.4 mg/dL — ABNORMAL LOW (ref 8.9–10.3)
Chloride: 89 mmol/L — ABNORMAL LOW (ref 98–111)
Creatinine, Ser: 0.89 mg/dL (ref 0.44–1.00)
GFR, Estimated: >60 mL/min (ref >60)
Glucose, Bld: 166 mg/dL — ABNORMAL HIGH (ref 70–99)
Potassium: 3.5 mmol/L (ref 3.5–5.1)
Sodium: 132 mmol/L — ABNORMAL LOW (ref 135–145)

## 2022-02-04 LAB — GLUCOSE, CAPILLARY
Glucose-Capillary: 115 mg/dL — ABNORMAL HIGH (ref 70–99)
Glucose-Capillary: 117 mg/dL — ABNORMAL HIGH (ref 70–99)
Glucose-Capillary: 164 mg/dL — ABNORMAL HIGH (ref 70–99)
Glucose-Capillary: 176 mg/dL — ABNORMAL HIGH (ref 70–99)
Glucose-Capillary: 196 mg/dL — ABNORMAL HIGH (ref 70–99)
Glucose-Capillary: 207 mg/dL — ABNORMAL HIGH (ref 70–99)

## 2022-02-04 LAB — MAGNESIUM: Magnesium: 1.8 mg/dL (ref 1.7–2.4)

## 2022-02-04 LAB — PHOSPHORUS: Phosphorus: 2.7 mg/dL (ref 2.5–4.6)

## 2022-02-04 LAB — HEPARIN LEVEL (UNFRACTIONATED): Heparin Unfractionated: 0.6 IU/mL (ref 0.30–0.70)

## 2022-02-04 MED ORDER — MAGNESIUM SULFATE 2 GM/50ML IV SOLN
2.0000 g | Freq: Once | INTRAVENOUS | Status: AC
  Administered 2022-02-04: 2 g via INTRAVENOUS
  Filled 2022-02-04: qty 50

## 2022-02-04 MED ORDER — POTASSIUM CHLORIDE CRYS ER 10 MEQ PO TBCR
40.0000 meq | EXTENDED_RELEASE_TABLET | Freq: Once | ORAL | Status: AC
  Administered 2022-02-04: 40 meq via ORAL
  Filled 2022-02-04: qty 4

## 2022-02-04 MED ORDER — ACETAZOLAMIDE 250 MG PO TABS
250.0000 mg | ORAL_TABLET | Freq: Once | ORAL | Status: AC
  Administered 2022-02-04: 250 mg via ORAL
  Filled 2022-02-04: qty 1

## 2022-02-04 MED ORDER — METOLAZONE 5 MG PO TABS
5.0000 mg | ORAL_TABLET | Freq: Once | ORAL | Status: AC
  Administered 2022-02-04: 5 mg via ORAL
  Filled 2022-02-04: qty 1

## 2022-02-04 MED ORDER — POTASSIUM CHLORIDE 10 MEQ/50ML IV SOLN
10.0000 meq | INTRAVENOUS | Status: AC
  Administered 2022-02-04 (×6): 10 meq via INTRAVENOUS
  Filled 2022-02-04 (×6): qty 50

## 2022-02-04 NOTE — Progress Notes (Signed)
[facility] ADULT ICU REPLACEMENT PROTOCOL   The patient does apply for the [facility] Adult ICU Electrolyte Replacment Protocol based on the criteria listed below:   1.Exclusion criteria: TCTS, ECMO, Dialysis, and Myasthenia Gravis patients 2. Is GFR >/= 30 ml/min? Yes.    Patient's GFR today is >60 3. Is SCr </= 2? Yes.   Patient's SCr is 0.77 mg/dL 4. Did SCr increase >/= 0.5 in 24 hours? No. 5.Pt's weight >40kg  Yes.   6. Abnormal electrolyte(s): potassium 3.2  7. Electrolytes replaced per protocol 8.  Call MD STAT for K+ </= 2.5, Phos </= 1, or Mag </= 1 Physician:  n/a   [name] 02/04/2022 1:48 AM

## 2022-02-04 NOTE — Progress Notes (Signed)
Orthopedic Tech Progress Note Patient Details:  [name] 1960-02-18 [id number]  Unna boots applied to BLE.  Ortho Devices Type of Ortho Device: Haematologist Ortho Device/Splint Location: BLE Ortho Device/Splint Interventions: Ordered, Application, Adjustment   Post Interventions Patient Tolerated: Well Instructions Provided: Care of device  [name] Jeri Modena 02/04/2022, 3:23 PM

## 2022-02-04 NOTE — Procedures (Signed)
Pre procedural Dx: Bacteremia Post procedural Dx: Same  Successful removal of tunneled left IJ central venous catheter  EBL: None No immediate complications.  Dressing to remain x 24 hours then may remove and shower.  Please place new order for replacement of tunneled line when appropriate.  Please see imaging section of Epic for full dictation.  Joaquim Nam PA-C 02/04/2022 2:45 PM

## 2022-02-04 NOTE — Progress Notes (Signed)
Palliative:  HPI: 62 y.o. female  with past medical history of chronic diastolic heart failure, cor pulmonale, severe pulmonary hypertension (on Remodulin and sildenafil), atrial fibrillation on Xarelto, HTN, HLD, CKD stage 3b, OSA aon CPAP, diabetes, GERD admitted on 01/18/2022 with aspiration pneumonia, pulmonary hypertension, CHF exacerbation, MSSA bacteremia initially requiring BiPAP support.    I met today with [name]. She is very sleepy today. She awakens and answers my questions. She is not as talkative as our previous visit. She does confirm that she desires ongoing care and is "hanging in there." Unfortunately she is not making much progress. She is resting fairly comfortably with no distress but with poor reserve and fatigue. I assisted her to turn and change position in bed. She denies any pain or distress at this time. No family at bedside. I will plan to reach out to family tomorrow as I am very concerned about her ability to recover from this hospitalization.   All questions/concerns addressed. Emotional support provided.   Exam: Sleepy, fatigued. No distress but poor reserve. Breathing regular, unlabored at rest but sats ~88% on 60L. Abd soft.   Plan: - DNR - Overall prognosis poor - Will try and reach out to family tomorrow to prepare them as we may be approaching end of life - Ongoing goals of care conversation; I will follow up tomorrow  25 min  Vinie Sill, NP Palliative Medicine Team Pager [phone] (Please see [email or web address] for schedule) Team Phone [phone]    Greater than 50%  of this time was spent counseling and coordinating care related to the above assessment and plan

## 2022-02-04 NOTE — Progress Notes (Signed)
[facility]  Date of Admission:  01/17/2022      Total days of antibiotics 4   Cefazolin 1/29 >> c           ASSESSMENT: [name] is a 62 y.o. female with MSSA bacteremia admitted for a/c respiratory failure. 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 She has persistently positive blood cultures pointing towards line infection (which is still in place) vs endocarditis.  She is not to the point where we can safely remove line d/t requirement to lay flat. Can't do TEE given her respiratory status and high risk for intubation and would favor empiric course of endocarditis treatment.   Central line holiday when safely feasible per PCCM/AHF team - she has persistent bacteremia, more favoring line related sepsis or endocarditis. Needs to be removed by IR - ?if they can remove w/o laying completely flat or in unit?  A/C respiratory failure with PNA, PH and a/c CHF - small improvement in oxygen requirements and worsening cxr. She remains DNI.   Continue antistaphylococal coverage (cefepime x 7d >> cefazolin) for tx - duration tentatively 6w following line removal unless we can determine no IE. For replacement line, she will probably need dual lumen given continuous PAH IV admin.   No new concern for metastatic sites of infection today    PLAN: continue Cefepime for 7d course to cover HCAP (day 2) Follow timing for line removal / holiday Repeat BCx today to see if we can clear.  Follow for any further evidence of metastatic infection.     Principal Problem:   MSSA bacteremia Active Problems:   Acute respiratory failure with hypoxia (HCC)   Hyponatremia   Hypokalemia   Community acquired pneumonia   Acute on chronic right-sided congestive heart failure (HCC)   Bloodstream infection due to central venous catheter   Pulmonary HTN (HCC)   HCAP (healthcare-associated  pneumonia)    Chlorhexidine Gluconate Cloth  6 each Topical Daily   Chlorhexidine Gluconate Cloth  6 each Topical Daily   furosemide  80 mg Intravenous BID   gabapentin  600 mg Oral TID   insulin aspart  0-9 Units Subcutaneous Q4H   mouth rinse  15 mL Mouth Rinse 4 times per day   pantoprazole  20 mg Oral BID   pravastatin  20 mg Oral Daily   sildenafil  20 mg Oral TID   sodium chloride flush  10-40 mL Intracatheter Q12H    SUBJECTIVE: No significant changes. Still not doing great lying flat.    Review of Systems: Review of Systems  Constitutional:  Positive for malaise/fatigue. Negative for chills and fever.  Respiratory:  Positive for sputum production and shortness of breath. Negative for cough.   Cardiovascular:  Negative for chest pain, palpitations and leg swelling.  Genitourinary:  Negative for dysuria.  Musculoskeletal:  Negative for back pain.  Neurological:  Negative for dizziness.      Allergies  Allergen Reactions   Aspirin Swelling    REACTION: airway swelling   Codeine Other (See Comments)    REACTION: tingling in lips and hard breathing - had reaction at dentist - states "I can't take certain kinds of codeine" - happened maybe 10 yr ago   Lisinopril Cough   Sulfonamide Derivatives Swelling    REACTION: airway swelling   Latex Rash    OBJECTIVE: Vitals:   02/04/22 1100 02/04/22 1128  02/04/22 1200 02/04/22 1300  BP: (!) 89/54  (!) 95/47 92/69  Pulse: 88  92 100  Resp: (!) 29  (!) 26 (!) 27  Temp:  98 F (36.7 C)    TempSrc:  Oral    SpO2: (!) 87%  (!) 87% (!) 82%  Weight:      Height:       Body mass index is 45.07 kg/m.   Physical Exam Constitutional:      Appearance: She is obese. She is ill-appearing.  Cardiovascular:     Rate and Rhythm: Normal rate.  Pulmonary:     Effort: Tachypnea present.     Breath sounds: Wheezing present.  Chest:     Comments: Old scab on right chest from previous CL removal  Skin:    General: Skin is warm  and dry.     Capillary Refill: Capillary refill takes less than 2 seconds.  Neurological:     Mental Status: She is alert and oriented to person, place, and time.      Lab Results Lab Results  Component Value Date   WBC 22.8 (H) 02/04/2022   HGB 13.2 02/04/2022   HCT 38.7 02/04/2022   MCV 86.6 02/04/2022   PLT 115 (L) 02/04/2022    Lab Results  Component Value Date   CREATININE 0.81 02/04/2022   BUN 27 (H) 02/04/2022   NA 133 (L) 02/04/2022   K 3.6 02/04/2022   CL 92 (L) 02/04/2022   CO2 31 02/04/2022    Lab Results  Component Value Date   ALT 10 02/04/2022   AST 27 02/04/2022   ALKPHOS 56 02/04/2022   BILITOT 0.8 02/04/2022     Microbiology: Recent Results (from the past 240 hour(s))  Urine Culture (for pregnant, neutropenic or urologic patients or patients with an indwelling urinary catheter)     Status: Abnormal   Collection Time: 01/04/2022 12:57 AM   Specimen: Urine, Clean Catch  Result Value Ref Range Status   Specimen Description URINE, CLEAN CATCH  Final   Special Requests   Final    NONE Performed at [facility], [place] [address], [place], [place] [address]    Culture MULTIPLE SPECIES PRESENT, SUGGEST RECOLLECTION (A)  Final   Report Status 02/02/2022 FINAL  Final  Resp panel by RT-PCR (RSV, Flu A&B, Covid) Anterior Nasal Swab     Status: None   Collection Time: 01/06/2022  7:33 PM   Specimen: Anterior Nasal Swab  Result Value Ref Range Status   SARS Coronavirus 2 by RT PCR NEGATIVE NEGATIVE Final    Comment: (NOTE) SARS-CoV-2 target nucleic acids are NOT DETECTED.  The SARS-CoV-2 RNA is generally detectable in upper respiratory specimens during the acute phase of infection. The lowest concentration of SARS-CoV-2 viral copies this assay can detect is 138 copies/mL. A negative result does not preclude SARS-Cov-2 infection and should not be used as the sole basis for treatment or other patient management decisions. A negative result may occur with   improper specimen collection/handling, submission of specimen other than nasopharyngeal swab, presence of viral mutation(s) within the areas targeted by this assay, and inadequate number of viral copies(<138 copies/mL). A negative result must be combined with clinical observations, patient history, and epidemiological information. The expected result is Negative.  Fact Sheet for Patients:  EntrepreneurPulse.com.au  Fact Sheet for Healthcare Providers:  IncredibleEmployment.be  This test is no t yet approved or cleared by the [place] FDA and  has been authorized for detection  and/or diagnosis of SARS-CoV-2 by FDA under an Emergency Use Authorization (EUA). This EUA will remain  in effect (meaning this test can be used) for the duration of the COVID-19 declaration under Section 564(b)(1) of the Act, 21 U.S.C.section 360bbb-3(b)(1), unless the authorization is terminated  or revoked sooner.       Influenza A by PCR NEGATIVE NEGATIVE Final   Influenza B by PCR NEGATIVE NEGATIVE Final    Comment: (NOTE) The Xpert Xpress SARS-CoV-2/FLU/RSV plus assay is intended as an aid in the diagnosis of influenza from Nasopharyngeal swab specimens and should not be used as a sole basis for treatment. Nasal washings and aspirates are unacceptable for Xpert Xpress SARS-CoV-2/FLU/RSV testing.  Fact Sheet for Patients: EntrepreneurPulse.com.au  Fact Sheet for Healthcare Providers: IncredibleEmployment.be  This test is not yet approved or cleared by the [place] FDA and has been authorized for detection and/or diagnosis of SARS-CoV-2 by FDA under an Emergency Use Authorization (EUA). This EUA will remain in effect (meaning this test can be used) for the duration of the COVID-19 declaration under Section 564(b)(1) of the Act, 21 U.S.C. section 360bbb-3(b)(1), unless the authorization is terminated or revoked.      Resp Syncytial Virus by PCR NEGATIVE NEGATIVE Final    Comment: (NOTE) Fact Sheet for Patients: EntrepreneurPulse.com.au  Fact Sheet for Healthcare Providers: IncredibleEmployment.be  This test is not yet approved or cleared by the [place] FDA and has been authorized for detection and/or diagnosis of SARS-CoV-2 by FDA under an Emergency Use Authorization (EUA). This EUA will remain in effect (meaning this test can be used) for the duration of the COVID-19 declaration under Section 564(b)(1) of the Act, 21 U.S.C. section 360bbb-3(b)(1), unless the authorization is terminated or revoked.  Performed at [facility], [place] [address], [place], [place] [address]   Blood culture (routine x 2)     Status: Abnormal   Collection Time: 01/12/2022  7:40 PM   Specimen: BLOOD  Result Value Ref Range Status   Specimen Description BLOOD RIGHT ANTECUBITAL  Final   Special Requests   Final    BOTTLES DRAWN AEROBIC AND ANAEROBIC Blood Culture adequate volume   Culture  Setup Time   Final    GRAM POSITIVE COCCI IN CLUSTERS IN BOTH AEROBIC AND ANAEROBIC BOTTLES CRITICAL RESULT CALLED TO, READ BACK BY AND VERIFIED WITH: PHARMD E.MARTIN AT 7824 02/01/2022 BY T.SAAD. Performed at [facility], [place] [address] [place], [place] [address]    Culture STAPHYLOCOCCUS AUREUS (A)  Final   Report Status 02/03/2022 FINAL  Final   Organism ID, Bacteria STAPHYLOCOCCUS AUREUS  Final      Susceptibility   Staphylococcus aureus - MIC*    CIPROFLOXACIN <=0.5 SENSITIVE Sensitive     ERYTHROMYCIN >=8 RESISTANT Resistant     GENTAMICIN <=0.5 SENSITIVE Sensitive     OXACILLIN <=0.25 SENSITIVE Sensitive     TETRACYCLINE <=1 SENSITIVE Sensitive     VANCOMYCIN 1 SENSITIVE Sensitive     TRIMETH/SULFA <=10 SENSITIVE Sensitive     CLINDAMYCIN RESISTANT Resistant     RIFAMPIN <=0.5 SENSITIVE Sensitive     Inducible Clindamycin POSITIVE Resistant     * STAPHYLOCOCCUS  AUREUS  Blood Culture ID Panel (Reflexed)     Status: Abnormal   Collection Time: 01/13/2022  7:40 PM  Result Value Ref Range Status   Enterococcus faecalis NOT DETECTED NOT DETECTED Final   Enterococcus Faecium NOT DETECTED NOT DETECTED Final   Listeria monocytogenes NOT DETECTED NOT DETECTED  Final   Staphylococcus species DETECTED (A) NOT DETECTED Final    Comment: CRITICAL RESULT CALLED TO, READ BACK BY AND VERIFIED WITH: PHARMD E.MARTIN AT 1130 02/01/2022 BY T.SAAD.    Staphylococcus aureus (BCID) DETECTED (A) NOT DETECTED Final    Comment: CRITICAL RESULT CALLED TO, READ BACK BY AND VERIFIED WITH: PHARMD E.MARTIN AT 1130 02/01/2022 BY T.SAAD.    Staphylococcus epidermidis NOT DETECTED NOT DETECTED Final   Staphylococcus lugdunensis NOT DETECTED NOT DETECTED Final   Streptococcus species NOT DETECTED NOT DETECTED Final   Streptococcus agalactiae NOT DETECTED NOT DETECTED Final   Streptococcus pneumoniae NOT DETECTED NOT DETECTED Final   Streptococcus pyogenes NOT DETECTED NOT DETECTED Final   A.calcoaceticus-baumannii NOT DETECTED NOT DETECTED Final   Bacteroides fragilis NOT DETECTED NOT DETECTED Final   Enterobacterales NOT DETECTED NOT DETECTED Final   Enterobacter cloacae complex NOT DETECTED NOT DETECTED Final   Escherichia coli NOT DETECTED NOT DETECTED Final   Klebsiella aerogenes NOT DETECTED NOT DETECTED Final   Klebsiella oxytoca NOT DETECTED NOT DETECTED Final   Klebsiella pneumoniae NOT DETECTED NOT DETECTED Final   Proteus species NOT DETECTED NOT DETECTED Final   Salmonella species NOT DETECTED NOT DETECTED Final   Serratia marcescens NOT DETECTED NOT DETECTED Final   Haemophilus influenzae NOT DETECTED NOT DETECTED Final   Neisseria meningitidis NOT DETECTED NOT DETECTED Final   Pseudomonas aeruginosa NOT DETECTED NOT DETECTED Final   Stenotrophomonas maltophilia NOT DETECTED NOT DETECTED Final   Candida albicans NOT DETECTED NOT DETECTED Final   Candida auris  NOT DETECTED NOT DETECTED Final   Candida glabrata NOT DETECTED NOT DETECTED Final   Candida krusei NOT DETECTED NOT DETECTED Final   Candida parapsilosis NOT DETECTED NOT DETECTED Final   Candida tropicalis NOT DETECTED NOT DETECTED Final   Cryptococcus neoformans/gattii NOT DETECTED NOT DETECTED Final   Meth resistant mecA/C and MREJ NOT DETECTED NOT DETECTED Final    Comment: Performed at [facility], [address] [place], [place] [address]  Blood culture (routine x 2)     Status: None (Preliminary result)   Collection Time: 01/08/2022  8:41 PM   Specimen: BLOOD RIGHT HAND  Result Value Ref Range Status   Specimen Description BLOOD RIGHT HAND  Final   Special Requests   Final    BOTTLES DRAWN AEROBIC AND ANAEROBIC Blood Culture adequate volume   Culture   Final    NO GROWTH 4 DAYS Performed at [facility], [place] [address], [place], [place] [address]    Report Status PENDING  Incomplete  MRSA Next Gen by PCR, Nasal     Status: None   Collection Time: 02/01/22 12:38 AM   Specimen: Nasal Mucosa; Nasal Swab  Result Value Ref Range Status   MRSA by PCR Next Gen NOT DETECTED NOT DETECTED Final    Comment: (NOTE) The GeneXpert MRSA Assay (FDA approved for NASAL specimens only), is one component of a comprehensive MRSA colonization surveillance program. It is not intended to diagnose MRSA infection nor to guide or monitor treatment for MRSA infections. Test performance is not FDA approved in patients less than 55 years old. Performed at [facility], [place] [address], [place], [place] [address]   Culture, blood (Routine X 2) w Reflex to ID Panel     Status: Abnormal   Collection Time: 02/01/22  4:53 PM   Specimen: BLOOD  Result Value Ref Range Status   Specimen Description BLOOD SITE NOT SPECIFIED  Final  Special Requests   Final    BOTTLES DRAWN AEROBIC AND ANAEROBIC Blood Culture adequate volume   Culture  Setup Time   Final    GRAM POSITIVE COCCI IN  CLUSTERS IN PEDIATRIC BOTTLE CRITICAL RESULT CALLED TO, READ BACK BY AND VERIFIED WITH: PHARMD CAREN AMEND ON 02/02/22 @ 2125 BY DRT    Culture (A)  Final    STAPHYLOCOCCUS AUREUS SUSCEPTIBILITIES PERFORMED ON PREVIOUS CULTURE WITHIN THE LAST 5 DAYS. Performed at [facility], [place] [address], [place], [place] [address]    Report Status 02/04/2022 FINAL  Final  Blood Culture ID Panel (Reflexed)     Status: Abnormal   Collection Time: 02/01/22  4:53 PM  Result Value Ref Range Status   Enterococcus faecalis NOT DETECTED NOT DETECTED Final   Enterococcus Faecium NOT DETECTED NOT DETECTED Final   Listeria monocytogenes NOT DETECTED NOT DETECTED Final   Staphylococcus species DETECTED (A) NOT DETECTED Final    Comment: CRITICAL RESULT CALLED TO, READ BACK BY AND VERIFIED WITH: PHARMD CAREN AMEND ON 02/02/22 @ 2125 BY DRT    Staphylococcus aureus (BCID) DETECTED (A) NOT DETECTED Final    Comment: CRITICAL RESULT CALLED TO, READ BACK BY AND VERIFIED WITH: PHARMD CAREN AMEND ON 02/02/22 @ 2125 BY DRT    Staphylococcus epidermidis NOT DETECTED NOT DETECTED Final   Staphylococcus lugdunensis NOT DETECTED NOT DETECTED Final   Streptococcus species NOT DETECTED NOT DETECTED Final   Streptococcus agalactiae NOT DETECTED NOT DETECTED Final   Streptococcus pneumoniae NOT DETECTED NOT DETECTED Final   Streptococcus pyogenes NOT DETECTED NOT DETECTED Final   A.calcoaceticus-baumannii NOT DETECTED NOT DETECTED Final   Bacteroides fragilis NOT DETECTED NOT DETECTED Final   Enterobacterales NOT DETECTED NOT DETECTED Final   Enterobacter cloacae complex NOT DETECTED NOT DETECTED Final   Escherichia coli NOT DETECTED NOT DETECTED Final   Klebsiella aerogenes NOT DETECTED NOT DETECTED Final   Klebsiella oxytoca NOT DETECTED NOT DETECTED Final   Klebsiella pneumoniae NOT DETECTED NOT DETECTED Final   Proteus species NOT DETECTED NOT DETECTED Final   Salmonella species NOT DETECTED NOT DETECTED Final    Serratia marcescens NOT DETECTED NOT DETECTED Final   Haemophilus influenzae NOT DETECTED NOT DETECTED Final   Neisseria meningitidis NOT DETECTED NOT DETECTED Final   Pseudomonas aeruginosa NOT DETECTED NOT DETECTED Final   Stenotrophomonas maltophilia NOT DETECTED NOT DETECTED Final   Candida albicans NOT DETECTED NOT DETECTED Final   Candida auris NOT DETECTED NOT DETECTED Final   Candida glabrata NOT DETECTED NOT DETECTED Final   Candida krusei NOT DETECTED NOT DETECTED Final   Candida parapsilosis NOT DETECTED NOT DETECTED Final   Candida tropicalis NOT DETECTED NOT DETECTED Final   Cryptococcus neoformans/gattii NOT DETECTED NOT DETECTED Final   Meth resistant mecA/C and MREJ NOT DETECTED NOT DETECTED Final    Comment: Performed at [facility], [address], [place], [place] [address]    Janene Madeira, MSN, NP-C [facility] [email or web address] Pager: [phone] Office: [phone] [facility] Main Line: [place] Communication Welcome

## 2022-02-04 NOTE — Progress Notes (Signed)
NAME:  [name], MRN:  [id number], DOB:  Dec 12, 1960, LOS: 4 ADMISSION DATE:  01/10/2022, CONSULTATION DATE:  01/29/2022 REFERRING MD:  Regenia Skeeter - EDP CHIEF COMPLAINT:  Acute hypoxic resp failure; CHF vs. COPD exacerbation   History of Present Illness:  62 year old woman who presented to [facility] 1/28 for respiratory failure. PMHx significant for chronic diastolic HF, cor pulmonale, severe pulmonary hypertension (on Remodulin and sildenafil), A-fib on Xarelto, OSA on CPAP presents to [facility] ED on 5/28 with SOB.  Patient states her breathing has worsened over the past few days.  States she is not very compliant with her medications at home.  States her breathing has gotten a lot more swollen over the past few days making it harder on her breathing.  On 1/28 her breathing was much worse and called EMS.  EMS found patient with sats 72% on room air and initially placed on O2 but required CPAP.  Upon arrival to [facility] ED on 1/28, patient in acute respiratory distress on BiPAP.  Initial BP 99/50.  RR 30s.  Afebrile but wbc 25.4 EKG with no signs of ischemia.  Given 80 IV Lasix. CXR w/ R basilar opacity. Given rocephin/azithro for possible cap.  Cultures obtained.  HF notified by ED physician for consult. PCCM consulted for ICU admission.  Pertinent ED labs: COVID/flu/RSV negative, NA 128, potassium 2.5, mag 1.8, creat 1.03, BNP 449,Troponin 371 troponin 371 then 373, VBG 7.46, 39, 50, 28.  Pertinent Medical History:   Past Medical History:  Diagnosis Date   Acute on chronic diastolic (congestive) heart failure ([place]) 06/25/2013   COPD (chronic obstructive pulmonary disease) (HCC)    Cor pulmonale (HCC)    PA Peak pressure 57mHg   Depression    Diabetes mellitus    well controlled on metformin   External hemorrhoid 01/11/2020   GERD (gastroesophageal reflux disease)    H/O mental retardation    Hyperlipidemia    Hypertension    OSA (obstructive sleep apnea)    CPAP   Pulmonary hypertension (HCC)     Restrictive lung disease    PFTs 06/2012 (FVC 54% predicted and FEV1 68% predicted w minimal bronchodilator response).   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 ulcer ([place]    chornic, ?followed up at wound care center, multiple courses of antibiotics in past for cellulitis, on lasix   Significant Hospital Events: Including procedures, antibiotic start and stop dates in addition to other pertinent events   1/28 Admitted likely CHF exacerbation on bipap 1/29 Remains on BiPAP, trialed HFNC but desaturated 1/30 Off of BiPAP in AM, tolerated HHFNC all day, fatigued in afternoon with desats 1/31 Off of BiPAP, less reserve today; sats ~85%. CXR with worsening bibasilar airspace disease,  L > R. Abx broadened to cefepime; aggressive pulm toilet/diuresis per AHF team. Net -2.3L. 2/01 Remains on OptiFlow, unable to wean. D/w IR re: tunneled line removal. Ongoing aggressive diuresis.   Interim History / Subjective:  No significant events overnight Feeling marginally better today O2 sat running 84-89% on Optiflow Ongoing aggressive diuresis per AHF team Discussion with IR re: tunneled line removal, ID request for source control  Objective:  Blood pressure (!) 94/50, pulse 86, temperature 97.8 F (36.6 C), temperature source Axillary, resp. rate (!) 31, height '5\' 3"'$  (1.6 m), weight 115.4 kg, last menstrual period 02/19/2011, SpO2 (!) 88 %. CVP:  [8 mmHg-30 mmHg] 12 mmHg  FiO2 (%):  [100 %] 100 %   Intake/Output Summary (Last 24 hours) at 02/04/2022 0817 Last data filed at 02/04/2022 0800 Gross per 24 hour  Intake 1630.43 ml  Output 4100 ml  Net -2469.57 ml    Filed Weights   02/02/22 0500 02/03/22 0500 02/04/22 0500  Weight: 118.1 kg 111.3 kg 115.4 kg   Physical Examination: General: Chronically ill-appearing middle-aged woman in NAD. Pleasant and conversant. HEENT: [place]/AT, anicteric sclera, PERRL,  moist mucous membranes. OptiFlow in place. Neuro: Awake, oriented x 4. Responds to verbal stimuli. Following commands consistently. Moves all 4 extremities spontaneously. Generalized weakness. CV: RRR, no m/g/r. PULM: Breathing tachypneic and mildly labored on OptiFlow. Lung fields diminished throughout, L > R. GI: Obese, mildly distended but compressible, nontender. Hypoactive bowel sounds. Extremities: Bilateral chronic symmetric 1-2+ nonpitting LE edema noted. Skin: Warm/dry, no rashes.  Assessment & Plan:   Acute-on-chronic respiratory failure w/ hypoxia (On 4L O2) - Likely CHF exacerbation (patient states non compliant w/ medications). LE duplex negative. Echo 1/29 with EF 60-65%, G1DD, RV dilated with mod reduced systolic function, PAP 58, small pericardial effusion). Hx OSA on home CPAP Probable OHS - Continue HHFNC (OptiFlow) - Wean FiO2 for O2 sat > 88% - BiPAP PRN + QHS - Aggressive pulmonary toileting/hygiene (Chest PT, IS, flutter) - CVP monitoring - Aggressive diuresis per AHF, goal net negative - Continue cefepime x 5-7 days - DNR/DNI  Acute on chronic diastolic CHF/cor pulmonale  Pulmonary HTN Group 1, II, III - Follow[facility] 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 - ID following, appreciate assistance - Continue Ancef for MSSA bacteremia - Cefepime for HCAP coverqage - Follow Cx data - Unable to obtain TEE due to respiratory instability; likely would not change treatment - Ultimately needs line holiday - d/w IR re: tunneled catheter  removal  Abdominal distention - likely ascites from hypervolemia - Diuresis as above - No drainable pockets of ascites on bedside POCUS  Chronic venous insufficiency / venous stasis LE Dopplers 1/29 negative for DVT. - Leg wraps - Unna boots  Hx DM - SSI - CBGs Q4H - Goal CBG 140-180  Hx GERD - PPI  Best Practice: (right click and "Reselect all SmartList Selections" daily)   Diet/type: NPO, sips with meds DVT prophylaxis: systemic heparin GI prophylaxis: PPI Lines: N/A Foley:  N/A Code Status:  full code Last date of multidisciplinary goals of care discussion [1/28 updated patient at bedside. Attempted to update son Legrand Como over phone but no answer]  Critical care time: 34 minutes   The patient is critically ill with multiple organ system failure and requires high complexity decision making for assessment and support, frequent evaluation and titration of therapies, advanced monitoring, review of radiographic studies and interpretation of complex data.   Critical Care Time devoted to patient care services, exclusive of separately billable procedures, described in this note is 34 minutes.  Lestine Mount, PA-C [facility] 02/04/22 8:17 AM  Please see [email or web address] for pager details.  From 7A-7P if no response, please call [phone] After hours, please call ELink [phone]

## 2022-02-04 NOTE — Plan of Care (Signed)

## 2022-02-04 NOTE — Progress Notes (Signed)
[place] for Heparin (Xarelto on hold) Indication: atrial fibrillation  Allergies  Allergen Reactions   Aspirin Swelling    REACTION: airway swelling   Codeine Other (See Comments)    REACTION: tingling in lips and hard breathing - had reaction at dentist - states "I can't take certain kinds of codeine" - happened maybe 10 yr ago   Lisinopril Cough   Sulfonamide Derivatives Swelling    REACTION: airway swelling   Latex Rash   Patient Measurements: Height: '5\' 3"'$  (160 cm) Weight: 115.4 kg (254 lb 6.6 oz) IBW/kg (Calculated) : 52.4 Heparin dosing wt: 81 kg  Vital Signs: Temp: 97.8 F (36.6 C) (02/01 0738) Temp Source: Axillary (02/01 0738) BP: 102/50 (02/01 0700) Pulse Rate: 84 (02/01 0733)  Labs: Recent Labs    02/01/22 0827 02/01/22 0920 02/01/22 1152 02/02/22 0452 02/02/22 0625 02/03/22 0430 02/03/22 1155 02/03/22 2118 02/04/22 0506  HGB  --   --    < > 13.1  --  13.4  --   --  13.2  HCT  --   --   --  39.0  --  39.5  --   --  38.7  PLT  --   --   --  147*  --  137*  --   --  115*  HEPARINUNFRC  --   --    < > 0.10*   < > 0.33 0.37  --  0.60  CREATININE 1.09*  --    < >  --    < > 0.95 0.93 0.77  --   TROPONINIHS 204* 188*  --   --   --   --   --   --   --    < > = values in this interval not displayed.     Estimated Creatinine Clearance: 91.6 mL/min (by C-G formula based on SCr of 0.77 mg/dL).   Assessment: 62 y/o F with HF and PAH presents to the ED with shortness breath. She is on Xarelto PTA for afib. Holding Xarelto and starting Heparin. The patient states she hasn't taken her Xarelto in over one week.   Heparin level is therapeutic (0.6) on infusion at 2350 units/hr. Hgb 13s, plt down 115. No issues with line or bleeding reported per RN.   Goal of Therapy:  Heparin level 0.3-0.7 units/ml Monitor platelets by anticoagulation protocol: Yes   Plan:  Continue heparin gtt at 2350 units/hr Monitor daily HL, CBC,  and for s/sx of bleeding   Antonietta Jewel, PharmD, [place] Pharmacist  Phone: [phone] 02/04/2022 7:40 AM  Please check AMION for all [place] phone numbers After 10:00 PM, call [place] [phone]

## 2022-02-04 NOTE — Progress Notes (Addendum)
Advanced Heart Failure Rounding Note  PCP-Cardiologist: Dr. Haroldine Laws    Subjective:    On abx for suspected aspiration PNA and bacteremia. BCx from 1/28 + for Staph aureus. Echo (02/01/22) with EF 60-65%, D-shaped septum, moderate RV enlargement with moderately decreased systolic function, PASP 59, dilated IVC, no definite valvular vegetation.   Abx changed yesterday to cefepime.   On treprostinil + sildenafil for PH.   Diuresing. 4L in UOP yesterday. SCr stable. K 3.2. CVP remains variable, running ~12 overnight.   Continues on heated high flow O2, 60L/min. Required BiPAP overnight.   Objective:   Weight Range: 115.4 kg Body mass index is 45.07 kg/m.   Vital Signs:   Temp:  [97.8 F (36.6 C)-98.5 F (36.9 C)] 97.8 F (36.6 C) (02/01 0738) Pulse Rate:  [74-91] 86 (02/01 0800) Resp:  [17-33] 31 (02/01 0800) BP: (82-113)/(44-89) 94/50 (02/01 0800) SpO2:  [84 %-92 %] 88 % (02/01 0800) FiO2 (%):  [100 %] 100 % (02/01 0733) Weight:  [115.4 kg] 115.4 kg (02/01 0500) Last BM Date : 02/02/22  Weight change: Filed Weights   02/02/22 0500 02/03/22 0500 02/04/22 0500  Weight: 118.1 kg 111.3 kg 115.4 kg    Intake/Output:   Intake/Output Summary (Last 24 hours) at 02/04/2022 0911 Last data filed at 02/04/2022 0800 Gross per 24 hour  Intake 1606.97 ml  Output 4100 ml  Net -2493.03 ml      Physical Exam    General:  obese, chronically ill appearing on high flow  HEENT: Normal Neck: Supple. Thick neck, JVD not well visualized. Carotids 2+ bilat; no bruits. No lymphadenopathy or thyromegaly appreciated. Cor: PMI nondisplaced. Regular rate & rhythm. No rubs, gallops or murmurs. Lungs: decreased BS at the bases bilaterally. No wheezing   Abdomen: obese, soft, nontender, nondistended. No hepatosplenomegaly. No bruits or masses. Good bowel sounds. Extremities: No cyanosis, clubbing, rash, trace b/l LE edema,  +unna boots  Neuro: Alert & orientedx3, cranial nerves grossly  intact. moves all 4 extremities w/o difficulty. Affect pleasant   Telemetry   NSR 90s   EKG    N/A   Labs    CBC Recent Labs    02/03/22 0430 02/04/22 0506  WBC 20.2* 22.8*  HGB 13.4 13.2  HCT 39.5 38.7  MCV 86.6 86.6  PLT 137* 193*   Basic Metabolic Panel Recent Labs    02/02/22 0625 02/02/22 1414 02/03/22 0430 02/03/22 1155 02/03/22 2118  NA 136   < > 136 135 134*  K 3.5   < > 4.1 3.9 3.2*  CL 97*   < > 95* 94* 89*  CO2 28   < > 31 32 32  GLUCOSE 143*   < > 114* 136* 186*  BUN 42*   < > 36* 34* 30*  CREATININE 0.99   < > 0.95 0.93 0.77  CALCIUM 8.3*   < > 8.1* 8.2* 8.1*  MG 2.2  --  2.1  --   --   PHOS 3.9  --  3.7  --   --    < > = values in this interval not displayed.   Liver Function Tests No results for input(s): "AST", "ALT", "ALKPHOS", "BILITOT", "PROT", "ALBUMIN" in the last 72 hours.  No results for input(s): "LIPASE", "AMYLASE" in the last 72 hours. Cardiac Enzymes No results for input(s): "CKTOTAL", "CKMB", "CKMBINDEX", "TROPONINI" in the last 72 hours.  BNP: BNP (last 3 results) Recent Labs    07/02/21 2136 01/22/2022 1940  BNP  29.1 449.8*    ProBNP (last 3 results) No results for input(s): "PROBNP" in the last 8760 hours.   D-Dimer No results for input(s): "DDIMER" in the last 72 hours.  Hemoglobin A1C No results for input(s): "HGBA1C" in the last 72 hours.  Fasting Lipid Panel No results for input(s): "CHOL", "HDL", "LDLCALC", "TRIG", "CHOLHDL", "LDLDIRECT" in the last 72 hours. Thyroid Function Tests No results for input(s): "TSH", "T4TOTAL", "T3FREE", "THYROIDAB" in the last 72 hours.  Invalid input(s): "FREET3"  Other results:   Imaging    No results found.   Medications:     Scheduled Medications:  Chlorhexidine Gluconate Cloth  6 each Topical Daily   Chlorhexidine Gluconate Cloth  6 each Topical Daily   furosemide  80 mg Intravenous BID   gabapentin  600 mg Oral TID   insulin aspart  0-9 Units  Subcutaneous Q4H   mouth rinse  15 mL Mouth Rinse 4 times per day   pantoprazole  20 mg Oral BID   pravastatin  20 mg Oral Daily   sildenafil  20 mg Oral TID   sodium chloride flush  10-40 mL Intracatheter Q12H    Infusions:  sodium chloride Stopped (02/01/22 1154)   ceFEPime (MAXIPIME) IV Stopped (02/04/22 7209)   heparin 2,350 Units/hr (02/04/22 0800)   treprostinil (REMODULIN) 35,000,000 ng in pH 12 sterile diluent 100 mL (350,000 ng/mL) infusion 81 ng/kg/min (02/04/22 0800)    PRN Medications: acetaminophen, docusate sodium, Gerhardt's butt cream, ipratropium-albuterol, ondansetron (ZOFRAN) IV, mouth rinse, phenol, polyethylene glycol, sodium chloride flush    Patient Profile   [name] is a 62 y.o. female with a hx of morbid obesity, cognitive impairment, O7SJ, chronic diastolic HF, pAF (Xarelto), severe pulmonary HTN (on combination therapy with treprostinil and sildenafil) who is being seen today for the evaluation of chronic HF and PH therapies in setting of acute hypoxic respiratory failure requiring CICU admission.   Assessment/Plan   1. Acute on chronic hypoxemic respiratory failure: Baseline OHS/OSA on 4L home oxygen. Currently on Bipap.  She is being treating for community-acquired PNA on a baseline of pulmonary hypertension, OHS/OSA, COPD as well as RV failure.  - Continue abx per CCM.  - Continued diuresis today, continue IV Lasix 80 mg bid and supp K  - Continues on home IV treprostinil and sildenafil.  2. Pulmonary hypertension: Suspect mixed group 1, 2, and 3 (OHS/OSA, COPD).   [facility] is [facility].  - Continue home IV treprostinil.  - Continue sildenafil 20 tid.  3. RV failure: Echo in 6/23 with EF > 55%, moderate RV dysfunction. Echo this admission with EF 60-65%, D-shaped septum, RV moderately enlarged with moderately decreased systolic function, PASP 59. CVP ~12  -  continue IV Lasix 80 mg bid  - may need digoxin for RV support  - BP too soft for  other GDMT - continue sildenafil  4. PNA: Suspect community-acquired PNA with PCT 2.02 and WBCs 25.  RLL infiltrate on CXR.  - Continue Ancef per CCM 5. Atrial fibrillation: Paroxysmal, on Xarelto at home.  She is in NSR.  - Continue heparin gtt for now.  6. Elevated Troponin: HS-TnI 371 => 299.  Suspect demand ischemia from hypoxemia and volume overload.  HS-TnI in 1000s range in 2021, cath at that time showed minimal CAD. 7. Bacteremia: BCx from 1/28 + for staph aureus. Initially started on Ancef. Abx changed to cefepime per CCM   - will need tunneled PICC removed. Per CCM, currently too unstable  from respiratory standpoint to go to IR. Plan removal once on lower O2 requirements   - TTE this admit w/ no clear vegetations  - Would not do TEE (high risk procedure in this setting), per ID will treat like endocarditis regardless  Length of Stay: [address] PA-C  02/04/2022, 9:11 AM  Advanced Heart Failure Team Pager [phone] (M-F; 7a - 5p)  Please contact [facility] for night-coverage after hours (5p -7a ) and weekends on [email or web address]  Patient seen with PA, agree with the above note.   Still with oxygen saturation in 80s despite excellent diuresis yesterday.  I/Os net negative 2305.  Creatinine lower at 0.77.  CVP 11 on my read.  She is on cefepime.   CXR with bibasilar airspace disease.   General: On HFNC.  Neck: JVP 12 cm, no thyromegaly or thyroid nodule.  Lungs: Clear to auscultation bilaterally with normal respiratory effort. CV: Nondisplaced PMI.  Heart regular S1/S2, no S3/S4, no murmur. 1+ edema to knees.  Abdomen: Soft, nontender, no hepatosplenomegaly, no distention.  Skin: Intact without lesions or rashes.  Neurologic: Alert and oriented x 3.  Psych: Normal affect. Extremities: No clubbing or cyanosis.  HEENT: Normal.   1/29 cultures positive for MSSA.  Still has tunneled catheter.  Discussed with Dr. Tamala Julian, to remove at bedside today.   She is on cefepime  for broadened PNA coverage as well as MSSA.   Oxygen saturation remains low, she is DNI and on HFNC.  CVP still elevated at 11 though not markedly so.  - Will diurese aggressively again today, Lasix 80 mg IV bid + metolazone 5 + acetazolamide 250.   Continue sildenafil and IV treprostinil, will need to switch treprostinil to peripheral IV when tunneled catheter removed.   CRITICAL CARE Performed by: Loralie Champagne  Total critical care time: 40 minutes  Critical care time was exclusive of separately billable procedures and treating other patients.  Critical care was necessary to treat or prevent imminent or life-threatening deterioration.  Critical care was time spent personally by me on the following activities: development of treatment plan with patient and/or surrogate as well as nursing, discussions with consultants, evaluation of patient's response to treatment, examination of patient, obtaining history from patient or surrogate, ordering and performing treatments and interventions, ordering and review of laboratory studies, ordering and review of radiographic studies, pulse oximetry and re-evaluation of patient's condition.  Loralie Champagne 02/04/2022 10:31 AM

## 2022-02-04 DEATH — deceased

## 2022-02-05 ENCOUNTER — Inpatient Hospital Stay (HOSPITAL_COMMUNITY): Payer: Medicaid Other

## 2022-02-05 ENCOUNTER — Encounter: Payer: Medicaid Other | Admitting: Occupational Therapy

## 2022-02-05 DIAGNOSIS — J9601 Acute respiratory failure with hypoxia: Secondary | ICD-10-CM | POA: Diagnosis not present

## 2022-02-05 DIAGNOSIS — J189 Pneumonia, unspecified organism: Secondary | ICD-10-CM | POA: Diagnosis not present

## 2022-02-05 DIAGNOSIS — B9561 Methicillin susceptible Staphylococcus aureus infection as the cause of diseases classified elsewhere: Secondary | ICD-10-CM | POA: Diagnosis not present

## 2022-02-05 DIAGNOSIS — R7881 Bacteremia: Secondary | ICD-10-CM | POA: Diagnosis not present

## 2022-02-05 DIAGNOSIS — Z515 Encounter for palliative care: Secondary | ICD-10-CM | POA: Diagnosis not present

## 2022-02-05 DIAGNOSIS — I272 Pulmonary hypertension, unspecified: Secondary | ICD-10-CM | POA: Diagnosis not present

## 2022-02-05 DIAGNOSIS — T80211D Bloodstream infection due to central venous catheter, subsequent encounter: Secondary | ICD-10-CM | POA: Diagnosis not present

## 2022-02-05 DIAGNOSIS — Z7189 Other specified counseling: Secondary | ICD-10-CM | POA: Diagnosis not present

## 2022-02-05 LAB — GLUCOSE, CAPILLARY
Glucose-Capillary: 137 mg/dL — ABNORMAL HIGH (ref 70–99)
Glucose-Capillary: 138 mg/dL — ABNORMAL HIGH (ref 70–99)
Glucose-Capillary: 152 mg/dL — ABNORMAL HIGH (ref 70–99)
Glucose-Capillary: 144 mg/dL — ABNORMAL HIGH (ref 70–99)
Glucose-Capillary: 170 mg/dL — ABNORMAL HIGH (ref 70–99)
Glucose-Capillary: 147 mg/dL — ABNORMAL HIGH (ref 70–99)

## 2022-02-05 LAB — BASIC METABOLIC PANEL
Anion gap: 12 (ref 5–15)
Anion gap: 12 (ref 5–15)
BUN: 29 mg/dL — ABNORMAL HIGH (ref 6–20)
BUN: 32 mg/dL — ABNORMAL HIGH (ref 6–20)
CO2: 32 mmol/L (ref 22–32)
CO2: 33 mmol/L — ABNORMAL HIGH (ref 22–32)
Calcium: 8.2 mg/dL — ABNORMAL LOW (ref 8.9–10.3)
Calcium: 8.4 mg/dL — ABNORMAL LOW (ref 8.9–10.3)
Chloride: 89 mmol/L — ABNORMAL LOW (ref 98–111)
Chloride: 88 mmol/L — ABNORMAL LOW (ref 98–111)
Creatinine, Ser: 0.86 mg/dL (ref 0.44–1.00)
Creatinine, Ser: 1.09 mg/dL — ABNORMAL HIGH (ref 0.44–1.00)
GFR, Estimated: >60 mL/min (ref >60)
GFR, Estimated: 58 mL/min — ABNORMAL LOW (ref >60)
Glucose, Bld: 116 mg/dL — ABNORMAL HIGH (ref 70–99)
Glucose, Bld: 169 mg/dL — ABNORMAL HIGH (ref 70–99)
Potassium: 3.3 mmol/L — ABNORMAL LOW (ref 3.5–5.1)
Potassium: 3.6 mmol/L (ref 3.5–5.1)
Sodium: 133 mmol/L — ABNORMAL LOW (ref 135–145)
Sodium: 133 mmol/L — ABNORMAL LOW (ref 135–145)

## 2022-02-05 LAB — BLOOD CULTURE ID PANEL (REFLEXED) - BCID2

## 2022-02-05 LAB — CBC WITH DIFFERENTIAL/PLATELET
Abs Immature Granulocytes: 1.71 10*3/uL — ABNORMAL HIGH (ref 0.00–0.07)
Basophils Absolute: 0.2 10*3/uL — ABNORMAL HIGH (ref 0.0–0.1)
Basophils Relative: 1 %
Eosinophils Absolute: 0 10*3/uL (ref 0.0–0.5)
Eosinophils Relative: 0 %
HCT: 37.9 % (ref 36.0–46.0)
Hemoglobin: 12.8 g/dL (ref 12.0–15.0)
Immature Granulocytes: 7 %
Lymphocytes Relative: 8 %
Lymphs Abs: 1.9 10*3/uL (ref 0.7–4.0)
MCH: 28.9 pg (ref 26.0–34.0)
MCHC: 33.8 g/dL (ref 30.0–36.0)
MCV: 85.6 fL (ref 80.0–100.0)
Monocytes Absolute: 2.4 10*3/uL — ABNORMAL HIGH (ref 0.1–1.0)
Monocytes Relative: 10 %
Neutro Abs: 17.1 10*3/uL — ABNORMAL HIGH (ref 1.7–7.7)
Neutrophils Relative %: 74 %
Platelets: 92 10*3/uL — ABNORMAL LOW (ref 150–400)
RBC: 4.43 MIL/uL (ref 3.87–5.11)
RDW: 17.2 % — ABNORMAL HIGH (ref 11.5–15.5)
Smear Review: DECREASED
WBC: 23.3 10*3/uL — ABNORMAL HIGH (ref 4.0–10.5)
nRBC: 0.1 % (ref 0.0–0.2)

## 2022-02-05 LAB — CULTURE, BLOOD (ROUTINE X 2)
Culture: NO GROWTH
Special Requests: ADEQUATE

## 2022-02-05 LAB — HEPARIN LEVEL (UNFRACTIONATED): Heparin Unfractionated: 0.57 IU/mL (ref 0.30–0.70)

## 2022-02-05 LAB — MAGNESIUM: Magnesium: 2.2 mg/dL (ref 1.7–2.4)

## 2022-02-05 LAB — PHOSPHORUS: Phosphorus: 3.7 mg/dL (ref 2.5–4.6)

## 2022-02-05 MED ORDER — ZINC OXIDE 40 % EX OINT
TOPICAL_OINTMENT | Freq: Two times a day (BID) | CUTANEOUS | Status: DC
  Administered 2022-02-07: 1 via TOPICAL
  Filled 2022-02-05 (×2): qty 57

## 2022-02-05 MED ORDER — CEFAZOLIN SODIUM-DEXTROSE 2-4 GM/100ML-% IV SOLN: 2.0000 g | Freq: Three times a day (TID) | INTRAVENOUS | Status: DC

## 2022-02-05 MED ORDER — POTASSIUM CHLORIDE CRYS ER 20 MEQ PO TBCR
40.0000 meq | EXTENDED_RELEASE_TABLET | Freq: Two times a day (BID) | ORAL | Status: DC
  Administered 2022-02-05: 40 meq via ORAL
  Filled 2022-02-05: qty 2

## 2022-02-05 MED ORDER — METOLAZONE 5 MG PO TABS
5.0000 mg | ORAL_TABLET | Freq: Once | ORAL | Status: AC
  Administered 2022-02-05: 5 mg via ORAL
  Filled 2022-02-05: qty 1

## 2022-02-05 MED ORDER — STERILE WATER FOR INJECTION IJ SOLN
50.0000 ng/kg/min | INTRAVENOUS | Status: DC
  Filled 2022-02-05: qty 5

## 2022-02-05 MED ORDER — POTASSIUM CHLORIDE CRYS ER 20 MEQ PO TBCR
40.0000 meq | EXTENDED_RELEASE_TABLET | Freq: Once | ORAL | Status: AC
  Administered 2022-02-05: 40 meq via ORAL
  Filled 2022-02-05: qty 2

## 2022-02-05 MED ORDER — SODIUM CHLORIDE 0.9 % IV SOLN
2.0000 g | Freq: Three times a day (TID) | INTRAVENOUS | Status: DC
  Administered 2022-02-05 – 2022-02-08 (×10): 2 g via INTRAVENOUS
  Filled 2022-02-05 (×9): qty 12.5

## 2022-02-05 MED ORDER — LACTATED RINGERS IV BOLUS
250.0000 mL | Freq: Once | INTRAVENOUS | Status: AC
  Administered 2022-02-05: 250 mL via INTRAVENOUS

## 2022-02-05 NOTE — Plan of Care (Signed)

## 2022-02-05 NOTE — Progress Notes (Addendum)
Advanced Heart Failure Rounding Note  PCP-Cardiologist: Dr. Haroldine Laws    Subjective:    On abx for suspected aspiration PNA and bacteremia. BCx from 1/28 + for Staph aureus. Echo (02/01/22) with EF 60-65%, D-shaped septum, moderate RV enlargement with moderately decreased systolic function, PASP 59, dilated IVC, no definite valvular vegetation.   1/31 Abx changed  to cefepime.  2/1 Diuresed IV lasix+metolazone+ diamox. Tunneled PICC removed.    On treprostinil + sildenafil for PH.   Blood Cx- 2/1---> 1/2 GPC  Currently on Bipap. Desaturates quickly when on 60  liter/min.   Feels bad. Remains SOB.   Objective:   Weight Range: 112 kg Body mass index is 43.74 kg/m.   Vital Signs:   Temp:  [98 F (36.7 C)-98.9 F (37.2 C)] 98.8 F (37.1 C) (02/02 1118) Pulse Rate:  [82-100] 95 (02/02 0900) Resp:  [17-37] 34 (02/02 0900) BP: (85-111)/(44-77) 93/52 (02/02 0900) SpO2:  [82 %-93 %] 87 % (02/02 0900) FiO2 (%):  [100 %] 100 % (02/02 1043) Weight:  [314 kg] 112 kg (02/02 0500) Last BM Date : 02/04/22  Weight change: Filed Weights   02/03/22 0500 02/04/22 0500 02/05/22 0500  Weight: 111.3 kg 115.4 kg 112 kg    Intake/Output:   Intake/Output Summary (Last 24 hours) at 02/05/2022 1218 Last data filed at 02/05/2022 1053 Gross per 24 hour  Intake 2692.22 ml  Output 2850 ml  Net -157.78 ml      Physical Exam   General: On Bipap  HEENT: normal Neck: supple. JVP difficult to assess due to body habitus.  Carotids 2+ bilat; no bruits. No lymphadenopathy or thryomegaly appreciated. Cor: PMI nondisplaced. Regular rate & rhythm. No rubs, gallops or murmurs. Lungs: clear Abdomen: soft, nontender, nondistended. No hepatosplenomegaly. No bruits or masses. Good bowel sounds. Extremities: no cyanosis, clubbing, rash, R and LLE 1+ edema Neuro: alert & orientedx3, cranial nerves grossly intact. moves all 4 extremities w/o difficulty. Affect pleasant   Telemetry   SR 80-100s    EKG    N/A   Labs    CBC Recent Labs    02/04/22 0506 02/05/22 0717  WBC 22.8* 23.3*  NEUTROABS  --  17.1*  HGB 13.2 12.8  HCT 38.7 37.9  MCV 86.6 85.6  PLT 115* 92*   Basic Metabolic Panel Recent Labs    02/04/22 1021 02/04/22 1729 02/05/22 0717  NA 133* 132* 133*  K 3.6 3.5 3.3*  CL 92* 89* 89*  CO2 31 36* 32  GLUCOSE 176* 166* 116*  BUN 27* 26* 29*  CREATININE 0.81 0.89 0.86  CALCIUM 8.2* 8.4* 8.2*  MG 1.8  --  2.2  PHOS 2.7  --  3.7   Liver Function Tests Recent Labs    02/04/22 1021  AST 27  ALT 10  ALKPHOS 56  BILITOT 0.8  PROT 5.7*  ALBUMIN 2.4*    No results for input(s): "LIPASE", "AMYLASE" in the last 72 hours. Cardiac Enzymes No results for input(s): "CKTOTAL", "CKMB", "CKMBINDEX", "TROPONINI" in the last 72 hours.  BNP: BNP (last 3 results) Recent Labs    07/02/21 2136 01/13/2022 1940  BNP 29.1 449.8*    ProBNP (last 3 results) No results for input(s): "PROBNP" in the last 8760 hours.   D-Dimer No results for input(s): "DDIMER" in the last 72 hours.  Hemoglobin A1C No results for input(s): "HGBA1C" in the last 72 hours.  Fasting Lipid Panel No results for input(s): "CHOL", "HDL", "LDLCALC", "TRIG", "CHOLHDL", "LDLDIRECT" in  the last 72 hours. Thyroid Function Tests No results for input(s): "TSH", "T4TOTAL", "T3FREE", "THYROIDAB" in the last 72 hours.  Invalid input(s): "FREET3"  Other results:   Imaging    DG CHEST PORT 1 VIEW  Result Date: 02/05/2022 CLINICAL DATA:  Hypoxia and shortness of breath EXAM: PORTABLE CHEST 1 VIEW COMPARISON:  02/03/2022 FINDINGS: Lung apices obscured by chin and facial tissues. Moderate enlargement of the cardiopericardial silhouette. Stable left greater than right basilar airspace opacities. Obscured left hemidiaphragm due to left lower lobe airspace opacity. Mild interstitial accentuation bilaterally. Left-sided central line is no longer present.  No pneumothorax. IMPRESSION: 1. Moderate  enlargement of the cardiopericardial silhouette, without overt edema. 2. Stable left greater than right basilar airspace opacities could reflect atelectasis or pneumonia. 3. Mild interstitial accentuation bilaterally. 4. Left-sided central line is no longer present. No pneumothorax. Electronically Signed   By: Van Clines M.D.   On: 02/05/2022 09:06   IR Removal Tun Cv Cath W/O FL  Result Date: 02/04/2022 INDICATION: Patient with bacteremia. Request removal of tunneled left IJ central venous catheter EXAM: REMOVAL TUNNELED CENTRAL VENOUS CATHETER MEDICATIONS: None ANESTHESIA/SEDATION: None FLUOROSCOPY: None COMPLICATIONS: None immediate. PROCEDURE: Procedure indications, risks and alternatives reviewed with patient who provided verbal consent to proceed. The patient's left chest and catheter was prepped and draped in a normal sterile fashion. Using gentle traction the cuff of the catheter was exposed and the catheter was removed in it's entirety. Pressure was held until hemostasis was obtained. A sterile dressing was applied. The patient tolerated the procedure well with no immediate complications. IMPRESSION: Successful catheter removal as described above. Read by Candiss Norse, PA-C Electronically Signed   By: Ruthann Cancer M.D.   On: 02/04/2022 20:30     Medications:     Scheduled Medications:  Chlorhexidine Gluconate Cloth  6 each Topical Daily   Chlorhexidine Gluconate Cloth  6 each Topical Daily   furosemide  80 mg Intravenous BID   gabapentin  600 mg Oral TID   insulin aspart  0-9 Units Subcutaneous Q4H   liver oil-zinc oxide   Topical BID   mouth rinse  15 mL Mouth Rinse 4 times per day   pantoprazole  20 mg Oral BID   potassium chloride  40 mEq Oral BID   pravastatin  20 mg Oral Daily   sildenafil  20 mg Oral TID   sodium chloride flush  10-40 mL Intracatheter Q12H    Infusions:  sodium chloride Stopped (02/01/22 1154)   ceFEPime (MAXIPIME) IV Stopped (02/05/22 0601)    heparin 2,350 Units/hr (02/05/22 1144)   treprostinil (REMODULIN) 35,000,000 ng in pH 12 sterile diluent 100 mL (350,000 ng/mL) infusion 81 ng/kg/min (02/05/22 0534)    PRN Medications: acetaminophen, docusate sodium, Gerhardt's butt cream, ipratropium-albuterol, ondansetron (ZOFRAN) IV, mouth rinse, phenol, polyethylene glycol, sodium chloride flush    Patient Profile   [name] is a 62 y.o. female with a hx of morbid obesity, cognitive impairment, H6WV, chronic diastolic HF, pAF (Xarelto), severe pulmonary HTN (on combination therapy with treprostinil and sildenafil) who is being seen today for the evaluation of chronic HF and PH therapies in setting of acute hypoxic respiratory failure requiring CICU admission.   Assessment/Plan   1. Acute on chronic hypoxemic respiratory failure: Baseline OHS/OSA on 4L home oxygen. Currently on Bipap.  She is being treating for community-acquired PNA on a baseline of pulmonary hypertension, OHS/OSA, COPD as well as RV failure.  - Continue abx per CCM.  - Continued  diuresis today, continue IV Lasix 80 mg bid and supp K  - Continues on home IV treprostinil and sildenafil.  2. Pulmonary hypertension: Suspect mixed group 1, 2, and 3 (OHS/OSA, COPD).   She is followed at [facility].  - Continue home IV treprostinil.  - Continue sildenafil 20 tid.  3. RV failure: Echo in 6/23 with EF > 55%, moderate RV dysfunction. Echo this admission with EF 60-65%, D-shaped septum, RV moderately enlarged with moderately decreased systolic function, PASP 59.  - Continue to diurese with IV lasix 80 mg bid and supp K. Give metolazone 5 mg today.  - may need digoxin for RV support  - BP too soft for other GDMT - continue sildenafil for now. May need to stop with SBP < 80.  4. PNA: Suspect community-acquired PNA with PCT 2.02 and WBCs 25.  RLL infiltrate on CXR.  - On cefepime per CCM 5. Atrial fibrillation: Paroxysmal, on Xarelto at home. Maintaining SR.  - Continue  heparin gtt for now.  6. Elevated Troponin: HS-TnI 371 => 299.  Suspect demand ischemia from hypoxemia and volume overload.  HS-TnI in 1000s range in 2021, cath at that time showed minimal CAD. 7. Bacteremia: BCx from 1/28 + for staph aureus. Initially started on Ancef. Abx changed to cefepime per CCM   -Tunneled PICC removed.  - TTE this admit w/ no clear vegetations  - Would not do TEE (high risk procedure in this setting), per ID will treat like endocarditis regardless - Blood Cx 2/1 --1/2 GPC .  8. [place] DNR . May need Palliative Care.    Length of Stay: [place], NP  02/05/2022, 12:18 PM  Advanced Heart Failure Team Pager [phone] (M-F; 7a - 5p)  Please contact [facility] for night-coverage after hours (5p -7a ) and weekends on [email or web address]  Agree with above.   Remains on BIPAP and IV remodulin. Sats still marginal. Remains SOB. On lasix gtt  On cefepime for staph bacteremia. BP marginal   General:  Ill appearing. On bipap. SOB HEENT: normal + bipap Neck: supple. Unable to see jvp  Carotids 2+ bilat; no bruits. No lymphadenopathy or thryomegaly appreciated. Cor: PMI nondisplaced. Regular rate & rhythm. 2/6 TR Lungs: + rhonchi  Abdomen: obese  soft, nontender, nondistended. No hepatosplenomegaly. No bruits or masses. Good bowel sounds. Extremities: no cyanosis, clubbing, rash, 1-2+ edema + UNNA Neuro: alert & orientedx3, cranial nerves grossly intact. moves all 4 extremities w/o difficulty. Affect pleasant  She is critically ill. Continue Bipap, IV remodulin and diuresis. May be end-stage. She is now DNI. If resp status not improving consider switch to comfort care. If improves will need RHC priro to d/c and consideration of switch to oral therapy given multiple admits of line infections. Continue IV abx.   D/w PharmD and CCM at bedside  CRITICAL CARE Performed by: Glori Bickers  Total critical care time: 45 minutes  Critical care time was exclusive of separately  billable procedures and treating other patients.  Critical care was necessary to treat or prevent imminent or life-threatening deterioration.  Critical care was time spent personally by me (independent of midlevel providers or residents) on the following activities: development of treatment plan with patient and/or surrogate as well as nursing, discussions with consultants, evaluation of patient's response to treatment, examination of patient, obtaining history from patient or surrogate, ordering and performing treatments and interventions, ordering and review of laboratory studies, ordering and review of radiographic studies, pulse oximetry and re-evaluation of patient's  condition.  Glori Bickers, MD  3:34 PM

## 2022-02-05 NOTE — Progress Notes (Signed)
Palliative:  HPI: 62 y.o. female  with past medical history of chronic diastolic heart failure, cor pulmonale, severe pulmonary hypertension (on Remodulin and sildenafil), atrial fibrillation on Xarelto, HTN, HLD, CKD stage 3b, OSA aon CPAP, diabetes, GERD admitted on 01/12/2022 with aspiration pneumonia, pulmonary hypertension, CHF exacerbation, MSSA bacteremia initially requiring BiPAP support.      I met today with Justyn. She is awake and oriented. Less sleepy than yesterday. We discussed that she has had no progress other these few days. She is not improving. We discussed that there is a chance that she does not have improvement as she wishes. We discussed that her overall prognosis is poor and if she worsens that we have very little more support to offer but this would mean more comfort focused care if she became worse. She acknowledges and agrees. I explained that I am worried she may not improve to be able to leave the hospital. She expresses understanding but desires ongoing efforts at this time. She remains hopeful for improvement but acknowledges that this may not be possible. She gives me permission to call and explain to her son the severity of her current illness.   I called and I was able to speak with son, Legrand Como. I explained to Legrand Como that his mother is alert, oriented, and eating well. Unfortunately her oxygen needs are extreme with little room to increase support if she were to decline further. Legrand Como expresses that he knows that she has severe underlying illness but has been trying to maintain her wishes to stay at home although this has been difficult. I expressed to Legrand Como that she seems agreeable to placement but I am concerned that she is may not make it out of the hospital. We discussed her wishes to continue current treatment but that we would consider comfort care if she declined further. Legrand Como expresses understanding and acknowledges that he was not aware she was this ill. He  confirms he is aware of her wishes for DNR. He shares that he has not been visiting the hospital due to a previous bad experience in a hospital that makes it difficult for him to visit. He was appreciative of the update and wishes to be kept up to date on her progress.   All questions/concerns addressed. Emotional support provided. Updated RN and Dr. Tamala Julian.   Exam: Alert, oriented. Poor reserve. Breathing labored with conversation or activity. Sats ~84-89% on 60L - improved to 90% on BiPAP. Abd soft. Moves all extremities.   Plan: - DNR - Continue current interventions - Comfort care if she declines - Keep son up to date on progression  78 min  Vinie Sill, NP Palliative Medicine Team Pager [phone] (Please see [email or web address] for schedule) Team Phone [phone]    Greater than 50%  of this time was spent counseling and coordinating care related to the above assessment and plan

## 2022-02-05 NOTE — Progress Notes (Signed)
[facility] for Heparin (Xarelto on hold) Indication: atrial fibrillation  Allergies  Allergen Reactions   Aspirin Swelling    REACTION: airway swelling   Codeine Other (See Comments)    REACTION: tingling in lips and hard breathing - had reaction at dentist - states "I can't take certain kinds of codeine" - happened maybe 10 yr ago   Lisinopril Cough   Sulfonamide Derivatives Swelling    REACTION: airway swelling   Latex Rash   Patient Measurements: Height: '5\' 3"'$  (160 cm) Weight: 112 kg (246 lb 14.6 oz) IBW/kg (Calculated) : 52.4 Heparin dosing wt: 81 kg  Vital Signs: Temp: 98.7 F (37.1 C) (02/02 0755) Temp Source: Oral (02/02 0755) BP: 93/52 (02/02 0900) Pulse Rate: 95 (02/02 0900)  Labs: Recent Labs    02/03/22 0430 02/03/22 1155 02/03/22 2118 02/04/22 0506 02/04/22 1021 02/04/22 1729 02/05/22 0717  HGB 13.4  --   --  13.2  --   --   --   HCT 39.5  --   --  38.7  --   --   --   PLT 137*  --   --  115*  --   --   --   HEPARINUNFRC 0.33 0.37  --  0.60  --   --  0.57  CREATININE 0.95 0.93   < >  --  0.81 0.89 0.86   < > = values in this interval not displayed.     Estimated Creatinine Clearance: 83.7 mL/min (by C-G formula based on SCr of 0.86 mg/dL).   Assessment: 62 y/o F with HF and PAH presents to the ED with shortness breath. She is on Xarelto PTA for afib. Holding Xarelto and starting Heparin. The patient states she hasn't taken her Xarelto in over one week.   Heparin level is therapeutic (0.57) on infusion at 2350 units/hr. Hgb 13s, plt down 115 on last check 2/1. No issues with line or bleeding reported per RN.   Goal of Therapy:  Heparin level 0.3-0.7 units/ml Monitor platelets by anticoagulation protocol: Yes   Plan:  Continue heparin gtt at 2350 units/hr Monitor daily HL, CBC, and for s/sx of bleeding   Antonietta Jewel, PharmD, [place] Pharmacist  Phone: [phone] 02/05/2022 10:02 AM  Please check  AMION for all [facility] phone numbers After 10:00 PM, call [place] [phone]

## 2022-02-05 NOTE — Progress Notes (Signed)
[facility] for Infectious Disease  Date of Admission:  01/14/2022      Total days of antibiotics 5   cefepime 1/31 >> c  Cefazolin 1/29 > 1/31           ASSESSMENT: ABYGAYLE DELTORO is a 62 y.o. female with MSSA bacteremia admitted for a/c respiratory failure. 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 She has persistently positive blood cultures pointing towards line infection (which is still in place) vs endocarditis.  She is not to the point where we can safely remove line d/t requirement to lay flat. Can't do TEE given her respiratory status and high risk for intubation and would favor empiric course of endocarditis treatment.   Central line removed 2/01 - blood cultures redrawn today to prove clearance. Continue antistaphylococal coverage (cefepime x 7d EOT: 2/06 then >> cefazolin) for tx - duration 6w total from 2/01 following line removal. Too high risk for TEE - would treat presumptively.   A/C respiratory failure with PNA, PH and a/c CHF - small improvement in oxygen requirements and worsening cxr. She remains DNI. Considering PMT consult given persistent hypoxia.   No new concern for metastatic sites of infection today    PLAN: continue Cefepime for 7d course to cover HCAP (day 3) Repeat BCx today after line removal (done) Follow for any further evidence of metastatic infection.     Principal Problem:   MSSA bacteremia Active Problems:   Acute respiratory failure with hypoxia (HCC)   Hyponatremia   Hypokalemia   Community acquired pneumonia   Acute on chronic right-sided congestive heart failure ([facility])   Bloodstream infection due to central venous catheter   Pulmonary HTN (HCC)   HCAP (healthcare-associated pneumonia)    Chlorhexidine Gluconate Cloth  6 each Topical Daily   Chlorhexidine Gluconate Cloth  6 each Topical Daily   furosemide  80 mg Intravenous BID   gabapentin   600 mg Oral TID   insulin aspart  0-9 Units Subcutaneous Q4H   liver oil-zinc oxide   Topical BID   metolazone  5 mg Oral Once   mouth rinse  15 mL Mouth Rinse 4 times per day   pantoprazole  20 mg Oral BID   potassium chloride  40 mEq Oral BID   pravastatin  20 mg Oral Daily   sildenafil  20 mg Oral TID   sodium chloride flush  10-40 mL Intracatheter Q12H    SUBJECTIVE: No significant changes. With any effort she desaturates < 85%   Review of Systems: Review of Systems  Constitutional:  Positive for malaise/fatigue. Negative for chills and fever.  Respiratory:  Positive for sputum production and shortness of breath. Negative for cough.   Cardiovascular:  Negative for chest pain, palpitations and leg swelling.  Genitourinary:  Negative for dysuria.  Musculoskeletal:  Negative for back pain.  Neurological:  Negative for dizziness.      Allergies  Allergen Reactions   Aspirin Swelling    REACTION: airway swelling   Codeine Other (See Comments)    REACTION: tingling in lips and hard breathing - had reaction at dentist - states "I can't take certain kinds of codeine" - happened maybe 10 yr ago   Lisinopril Cough   Sulfonamide Derivatives Swelling    REACTION: airway swelling   Latex Rash    OBJECTIVE: Vitals:   02/05/22 0800 02/05/22 0815 02/05/22 0900 02/05/22 1118  BP:   Marland Kitchen)  93/52   Pulse: 97 94 95   Resp: (!) 23 (!) 34 (!) 34   Temp:    98.8 F (37.1 C)  TempSrc:    Oral  SpO2: (!) 87% (!) 89% (!) 87%   Weight:      Height:       Body mass index is 43.74 kg/m.   Physical Exam Constitutional:      Appearance: She is obese. She is ill-appearing.  Cardiovascular:     Rate and Rhythm: Normal rate.  Pulmonary:     Effort: Tachypnea present.     Breath sounds: Wheezing present.  Chest:     Comments: Old scab on right chest from previous CL removal  Skin:    General: Skin is warm and dry.     Capillary Refill: Capillary refill takes less than 2 seconds.   Neurological:     Mental Status: She is alert and oriented to person, place, and time.     Lab Results Lab Results  Component Value Date   WBC 23.3 (H) 02/05/2022   HGB 12.8 02/05/2022   HCT 37.9 02/05/2022   MCV 85.6 02/05/2022   PLT 92 (L) 02/05/2022    Lab Results  Component Value Date   CREATININE 0.86 02/05/2022   BUN 29 (H) 02/05/2022   NA 133 (L) 02/05/2022   K 3.3 (L) 02/05/2022   CL 89 (L) 02/05/2022   CO2 32 02/05/2022    Lab Results  Component Value Date   ALT 10 02/04/2022   AST 27 02/04/2022   ALKPHOS 56 02/04/2022   BILITOT 0.8 02/04/2022     Microbiology: Recent Results (from the past 240 hour(s))  Urine Culture (for pregnant, neutropenic or urologic patients or patients with an indwelling urinary catheter)     Status: Abnormal   Collection Time: 01/08/2022 12:57 AM   Specimen: Urine, Clean Catch  Result Value Ref Range Status   Specimen Description URINE, CLEAN CATCH  Final   Special Requests   Final    NONE Performed at [facility], [place] [address] [place], [place] [address]    Culture MULTIPLE SPECIES PRESENT, SUGGEST RECOLLECTION (A)  Final   Report Status 02/02/2022 FINAL  Final  Resp panel by RT-PCR (RSV, Flu A&B, Covid) Anterior Nasal Swab     Status: None   Collection Time: 01/15/2022  7:33 PM   Specimen: Anterior Nasal Swab  Result Value Ref Range Status   SARS Coronavirus 2 by RT PCR NEGATIVE NEGATIVE Final    Comment: (NOTE) SARS-CoV-2 target nucleic acids are NOT DETECTED.  The SARS-CoV-2 RNA is generally detectable in upper respiratory specimens during the acute phase of infection. The lowest concentration of SARS-CoV-2 viral copies this assay can detect is 138 copies/mL. A negative result does not preclude SARS-Cov-2 infection and should not be used as the sole basis for treatment or other patient management decisions. A negative result may occur with  improper specimen collection/handling, submission of specimen other than  nasopharyngeal swab, presence of viral mutation(s) within the areas targeted by this assay, and inadequate number of viral copies(<138 copies/mL). A negative result must be combined with clinical observations, patient history, and epidemiological information. The expected result is Negative.  Fact Sheet for Patients:  EntrepreneurPulse.com.au  Fact Sheet for Healthcare Providers:  IncredibleEmployment.be  This test is no t yet approved or cleared by the [place] FDA and  has been authorized for detection and/or diagnosis of SARS-CoV-2 by FDA under an Emergency Use  Authorization (EUA). This EUA will remain  in effect (meaning this test can be used) for the duration of the COVID-19 declaration under Section 564(b)(1) of the Act, 21 U.S.C.section 360bbb-3(b)(1), unless the authorization is terminated  or revoked sooner.       Influenza A by PCR NEGATIVE NEGATIVE Final   Influenza B by PCR NEGATIVE NEGATIVE Final    Comment: (NOTE) The Xpert Xpress SARS-CoV-2/FLU/RSV plus assay is intended as an aid in the diagnosis of influenza from Nasopharyngeal swab specimens and should not be used as a sole basis for treatment. Nasal washings and aspirates are unacceptable for Xpert Xpress SARS-CoV-2/FLU/RSV testing.  Fact Sheet for Patients: EntrepreneurPulse.com.au  Fact Sheet for Healthcare Providers: IncredibleEmployment.be  This test is not yet approved or cleared by the [place] FDA and has been authorized for detection and/or diagnosis of SARS-CoV-2 by FDA under an Emergency Use Authorization (EUA). This EUA will remain in effect (meaning this test can be used) for the duration of the COVID-19 declaration under Section 564(b)(1) of the Act, 21 U.S.C. section 360bbb-3(b)(1), unless the authorization is terminated or revoked.     Resp Syncytial Virus by PCR NEGATIVE NEGATIVE Final    Comment:  (NOTE) Fact Sheet for Patients: EntrepreneurPulse.com.au  Fact Sheet for Healthcare Providers: IncredibleEmployment.be  This test is not yet approved or cleared by the [place] FDA and has been authorized for detection and/or diagnosis of SARS-CoV-2 by FDA under an Emergency Use Authorization (EUA). This EUA will remain in effect (meaning this test can be used) for the duration of the COVID-19 declaration under Section 564(b)(1) of the Act, 21 U.S.C. section 360bbb-3(b)(1), unless the authorization is terminated or revoked.  Performed at [facility], [place] [address] [place], [place] [address]   Blood culture (routine x 2)     Status: Abnormal   Collection Time: 02/01/2022  7:40 PM   Specimen: BLOOD  Result Value Ref Range Status   Specimen Description BLOOD RIGHT ANTECUBITAL  Final   Special Requests   Final    BOTTLES DRAWN AEROBIC AND ANAEROBIC Blood Culture adequate volume   Culture  Setup Time   Final    GRAM POSITIVE COCCI IN CLUSTERS IN BOTH AEROBIC AND ANAEROBIC BOTTLES CRITICAL RESULT CALLED TO, READ BACK BY AND VERIFIED WITH: PHARMD E.MARTIN AT 6606 02/01/2022 BY T.SAAD. Performed at [facility], [place] [address], [place], [place] [address]    Culture STAPHYLOCOCCUS AUREUS (A)  Final   Report Status 02/03/2022 FINAL  Final   Organism ID, Bacteria STAPHYLOCOCCUS AUREUS  Final      Susceptibility   Staphylococcus aureus - MIC*    CIPROFLOXACIN <=0.5 SENSITIVE Sensitive     ERYTHROMYCIN >=8 RESISTANT Resistant     GENTAMICIN <=0.5 SENSITIVE Sensitive     OXACILLIN <=0.25 SENSITIVE Sensitive     TETRACYCLINE <=1 SENSITIVE Sensitive     VANCOMYCIN 1 SENSITIVE Sensitive     TRIMETH/SULFA <=10 SENSITIVE Sensitive     CLINDAMYCIN RESISTANT Resistant     RIFAMPIN <=0.5 SENSITIVE Sensitive     Inducible Clindamycin POSITIVE Resistant     * STAPHYLOCOCCUS AUREUS  Blood Culture ID Panel (Reflexed)     Status: Abnormal    Collection Time: 01/11/2022  7:40 PM  Result Value Ref Range Status   Enterococcus faecalis NOT DETECTED NOT DETECTED Final   Enterococcus Faecium NOT DETECTED NOT DETECTED Final   Listeria monocytogenes NOT DETECTED NOT DETECTED Final   Staphylococcus species DETECTED (A) NOT DETECTED Final  Comment: CRITICAL RESULT CALLED TO, READ BACK BY AND VERIFIED WITH: PHARMD E.MARTIN AT 1130 02/01/2022 BY T.SAAD.    Staphylococcus aureus (BCID) DETECTED (A) NOT DETECTED Final    Comment: CRITICAL RESULT CALLED TO, READ BACK BY AND VERIFIED WITH: PHARMD E.MARTIN AT 1130 02/01/2022 BY T.SAAD.    Staphylococcus epidermidis NOT DETECTED NOT DETECTED Final   Staphylococcus lugdunensis NOT DETECTED NOT DETECTED Final   Streptococcus species NOT DETECTED NOT DETECTED Final   Streptococcus agalactiae NOT DETECTED NOT DETECTED Final   Streptococcus pneumoniae NOT DETECTED NOT DETECTED Final   Streptococcus pyogenes NOT DETECTED NOT DETECTED Final   A.calcoaceticus-baumannii NOT DETECTED NOT DETECTED Final   Bacteroides fragilis NOT DETECTED NOT DETECTED Final   Enterobacterales NOT DETECTED NOT DETECTED Final   Enterobacter cloacae complex NOT DETECTED NOT DETECTED Final   Escherichia coli NOT DETECTED NOT DETECTED Final   Klebsiella aerogenes NOT DETECTED NOT DETECTED Final   Klebsiella oxytoca NOT DETECTED NOT DETECTED Final   Klebsiella pneumoniae NOT DETECTED NOT DETECTED Final   Proteus species NOT DETECTED NOT DETECTED Final   Salmonella species NOT DETECTED NOT DETECTED Final   Serratia marcescens NOT DETECTED NOT DETECTED Final   Haemophilus influenzae NOT DETECTED NOT DETECTED Final   Neisseria meningitidis NOT DETECTED NOT DETECTED Final   Pseudomonas aeruginosa NOT DETECTED NOT DETECTED Final   Stenotrophomonas maltophilia NOT DETECTED NOT DETECTED Final   Candida albicans NOT DETECTED NOT DETECTED Final   Candida auris NOT DETECTED NOT DETECTED Final   Candida glabrata NOT DETECTED NOT  DETECTED Final   Candida krusei NOT DETECTED NOT DETECTED Final   Candida parapsilosis NOT DETECTED NOT DETECTED Final   Candida tropicalis NOT DETECTED NOT DETECTED Final   Cryptococcus neoformans/gattii NOT DETECTED NOT DETECTED Final   Meth resistant mecA/C and MREJ NOT DETECTED NOT DETECTED Final    Comment: Performed at [facility], [address], [place], [place] [address]  Blood culture (routine x 2)     Status: None   Collection Time: 02/01/2022  8:41 PM   Specimen: BLOOD RIGHT HAND  Result Value Ref Range Status   Specimen Description BLOOD RIGHT HAND  Final   Special Requests   Final    BOTTLES DRAWN AEROBIC AND ANAEROBIC Blood Culture adequate volume   Culture   Final    NO GROWTH 5 DAYS Performed at [facility], [place] [address], [place], [place] [address]    Report Status 02/05/2022 FINAL  Final  MRSA Next Gen by PCR, Nasal     Status: None   Collection Time: 02/01/22 12:38 AM   Specimen: Nasal Mucosa; Nasal Swab  Result Value Ref Range Status   MRSA by PCR Next Gen NOT DETECTED NOT DETECTED Final    Comment: (NOTE) The GeneXpert MRSA Assay (FDA approved for NASAL specimens only), is one component of a comprehensive MRSA colonization surveillance program. It is not intended to diagnose MRSA infection nor to guide or monitor treatment for MRSA infections. Test performance is not FDA approved in patients less than 32 years old. Performed at [facility], [place] [address], [place], [place] [address]   Culture, blood (Routine X 2) w Reflex to ID Panel     Status: Abnormal   Collection Time: 02/01/22  4:53 PM   Specimen: BLOOD  Result Value Ref Range Status   Specimen Description BLOOD SITE NOT SPECIFIED  Final   Special Requests   Final    BOTTLES DRAWN AEROBIC AND  ANAEROBIC Blood Culture adequate volume   Culture  Setup Time   Final    GRAM POSITIVE COCCI IN CLUSTERS IN PEDIATRIC BOTTLE CRITICAL RESULT CALLED TO, READ BACK BY AND VERIFIED WITH:  PHARMD CAREN AMEND ON 02/02/22 @ 2125 BY DRT    Culture (A)  Final    STAPHYLOCOCCUS AUREUS SUSCEPTIBILITIES PERFORMED ON PREVIOUS CULTURE WITHIN THE LAST 5 DAYS. Performed at [facility], [place] [address], [place], [place] [address]    Report Status 02/04/2022 FINAL  Final  Blood Culture ID Panel (Reflexed)     Status: Abnormal   Collection Time: 02/01/22  4:53 PM  Result Value Ref Range Status   Enterococcus faecalis NOT DETECTED NOT DETECTED Final   Enterococcus Faecium NOT DETECTED NOT DETECTED Final   Listeria monocytogenes NOT DETECTED NOT DETECTED Final   Staphylococcus species DETECTED (A) NOT DETECTED Final    Comment: CRITICAL RESULT CALLED TO, READ BACK BY AND VERIFIED WITH: PHARMD CAREN AMEND ON 02/02/22 @ 2125 BY DRT    Staphylococcus aureus (BCID) DETECTED (A) NOT DETECTED Final    Comment: CRITICAL RESULT CALLED TO, READ BACK BY AND VERIFIED WITH: PHARMD CAREN AMEND ON 02/02/22 @ 2125 BY DRT    Staphylococcus epidermidis NOT DETECTED NOT DETECTED Final   Staphylococcus lugdunensis NOT DETECTED NOT DETECTED Final   Streptococcus species NOT DETECTED NOT DETECTED Final   Streptococcus agalactiae NOT DETECTED NOT DETECTED Final   Streptococcus pneumoniae NOT DETECTED NOT DETECTED Final   Streptococcus pyogenes NOT DETECTED NOT DETECTED Final   A.calcoaceticus-baumannii NOT DETECTED NOT DETECTED Final   Bacteroides fragilis NOT DETECTED NOT DETECTED Final   Enterobacterales NOT DETECTED NOT DETECTED Final   Enterobacter cloacae complex NOT DETECTED NOT DETECTED Final   Escherichia coli NOT DETECTED NOT DETECTED Final   Klebsiella aerogenes NOT DETECTED NOT DETECTED Final   Klebsiella oxytoca NOT DETECTED NOT DETECTED Final   Klebsiella pneumoniae NOT DETECTED NOT DETECTED Final   Proteus species NOT DETECTED NOT DETECTED Final   Salmonella species NOT DETECTED NOT DETECTED Final   Serratia marcescens NOT DETECTED NOT DETECTED Final   Haemophilus influenzae NOT  DETECTED NOT DETECTED Final   Neisseria meningitidis NOT DETECTED NOT DETECTED Final   Pseudomonas aeruginosa NOT DETECTED NOT DETECTED Final   Stenotrophomonas maltophilia NOT DETECTED NOT DETECTED Final   Candida albicans NOT DETECTED NOT DETECTED Final   Candida auris NOT DETECTED NOT DETECTED Final   Candida glabrata NOT DETECTED NOT DETECTED Final   Candida krusei NOT DETECTED NOT DETECTED Final   Candida parapsilosis NOT DETECTED NOT DETECTED Final   Candida tropicalis NOT DETECTED NOT DETECTED Final   Cryptococcus neoformans/gattii NOT DETECTED NOT DETECTED Final   Meth resistant mecA/C and MREJ NOT DETECTED NOT DETECTED Final    Comment: Performed at [facility], [address], [place], [place] [address]  Culture, blood (Routine X 2) w Reflex to ID Panel     Status: None (Preliminary result)   Collection Time: 02/04/22  2:17 PM   Specimen: BLOOD RIGHT HAND  Result Value Ref Range Status   Specimen Description BLOOD RIGHT HAND  Final   Special Requests   Final    BOTTLES DRAWN AEROBIC AND ANAEROBIC Blood Culture adequate volume   Culture   Final    NO GROWTH < 24 HOURS Performed at [facility], [place] [address] [place], [place] [address]    Report Status PENDING  Incomplete  Culture, blood (Routine X 2) w Reflex  to ID Panel     Status: None (Preliminary result)   Collection Time: 02/04/22  2:17 PM   Specimen: BLOOD RIGHT HAND  Result Value Ref Range Status   Specimen Description BLOOD RIGHT HAND  Final   Special Requests IN PEDIATRIC BOTTLE Blood Culture adequate volume  Final   Culture  Setup Time   Final    GRAM POSITIVE COCCI IN CLUSTERS IN PEDIATRIC BOTTLE Organism ID to follow Performed at [facility], [address], [place], [place] [address]    Culture GRAM POSITIVE COCCI IN CLUSTERS  Final   Report Status PENDING  Incomplete    Janene Madeira, MSN, NP-[facility] for Infectious Disease [email or web address] Pager: [phone] Office: [phone] [facility] Main Line: [place] Communication Welcome

## 2022-02-05 NOTE — Progress Notes (Signed)
NAME:  [name], MRN:  [id number], DOB:  1960/08/22, LOS: 5 ADMISSION DATE:  01/13/2022, CONSULTATION DATE:  01/16/2022 REFERRING MD:  Regenia Skeeter - EDP CHIEF COMPLAINT:  Acute hypoxic resp failure; CHF vs. COPD exacerbation   History of Present Illness:  62 year old woman who presented to [facility] 1/28 for respiratory failure. PMHx significant for chronic diastolic HF, cor pulmonale, severe pulmonary hypertension (on Remodulin and sildenafil), A-fib on Xarelto, OSA on CPAP presents to [facility] ED on 5/28 with SOB.  Patient states her breathing has worsened over the past few days.  States she is not very compliant with her medications at home.  States her breathing has gotten a lot more swollen over the past few days making it harder on her breathing.  On 1/28 her breathing was much worse and called EMS.  EMS found patient with sats 72% on room air and initially placed on O2 but required CPAP.  Upon arrival to [facility] ED on 1/28, patient in acute respiratory distress on BiPAP.  Initial BP 99/50.  RR 30s.  Afebrile but wbc 25.4 EKG with no signs of ischemia.  Given 80 IV Lasix. CXR w/ R basilar opacity. Given rocephin/azithro for possible cap.  Cultures obtained.  HF notified by ED physician for consult. PCCM consulted for ICU admission.  Pertinent ED labs: COVID/flu/RSV negative, NA 128, potassium 2.5, mag 1.8, creat 1.03, BNP 449,Troponin 371 troponin 371 then 373, VBG 7.46, 39, 50, 28.  Pertinent Medical History:   Past Medical History:  Diagnosis Date   Acute on chronic diastolic (congestive) heart failure ([place]) 06/25/2013   COPD (chronic obstructive pulmonary disease) (HCC)    Cor pulmonale (HCC)    PA Peak pressure 72mHg   Depression    Diabetes mellitus    well controlled on metformin   External hemorrhoid 01/11/2020   GERD (gastroesophageal reflux disease)    H/O mental retardation    Hyperlipidemia    Hypertension    OSA (obstructive sleep apnea)    CPAP   Pulmonary hypertension (HCC)     Restrictive lung disease    PFTs 06/2012 (FVC 54% predicted and FEV1 68% predicted w minimal bronchodilator response).   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    chorn[place], ?followed up at wound care center, multiple courses of antibiotics in past for cellulitis, on lasix   Significant Hospital Events: Including procedures, antibiotic start and stop dates in addition to other pertinent events   1/28 Admitted likely CHF exacerbation on bipap 1/29 Remains on BiPAP, trialed HFNC but desaturated 1/30 Off of BiPAP in AM, tolerated HHFNC all day, fatigued in afternoon with desats 1/31 Off of BiPAP, less reserve today; sats ~85%. CXR with worsening bibasilar airspace disease,  L > R. Abx broadened to cefepime; aggressive pulm toilet/diuresis per AHF team. Net -2.3L. 2/01 Remains on OptiFlow, unable to wean. Ongoing aggressive diuresis. Tunneled line removed at bedside by IR. 2/02 Stable O2 requirement, no significant changes. Remodulin to PIV.  Interim History / Subjective:  No significant events overnight Feeling nauseous today, persistent SOB not worse/not better Remains on OptiFlow, sats 88-89% at best Continued aggressive diuresis, 3.7L UOP yesterday (net -4.8L/24H) Tunneled line removed by IR at bedside  Objective:  Blood pressure (!) 96/50, pulse 94, temperature 98.7 F (37.1 C), temperature source Oral, resp. rate (!) 34, height '5\' 3"'$  (1.6 m), weight 112 kg, last menstrual period  02/19/2011, SpO2 (!) 89 %. CVP:  [10 mmHg] 10 mmHg  FiO2 (%):  [100 %] 100 %   Intake/Output Summary (Last 24 hours) at 02/05/2022 6568 Last data filed at 02/05/2022 0800 Gross per 24 hour  Intake 2644.11 ml  Output 2800 ml  Net -155.89 ml    Filed Weights   02/03/22 0500 02/04/22 0500 02/05/22 0500  Weight: 111.3 kg 115.4 kg 112 kg   Physical Examination: General: Acute-on-chronically  ill-appearing middle-aged woman in NAD. Appears uncomfortable. HEENT: [place]/AT, anicteric sclera, PERRL, moist mucous membranes. OptiFlow in place. Neuro: Awake, oriented x 4. Responds to verbal stimuli. Following commands consistently. Moves all 4 extremities spontaneously. Generalized weakness. CV: RRR, no m/g/r. PULM: Breathing tachypneic and mildly labored on Optiflow (60L, FiO2 100%). Lung fields with scattered rhonchi, diminished at bilateral bases. GI: Soft, nontender, mildly distended. Normoactive bowel sounds. Extremities: Bilateral symmetric 1+ chronic LE edema noted, wraps in place. Skin: Warm/dry, no rashes.  Assessment & Plan:   Acute-on-chronic respiratory failure w/ hypoxia (On 4L O2) - Likely CHF exacerbation (patient states non compliant w/ medications). LE duplex negative. Echo 1/29 with EF 60-65%, G1DD, RV dilated with mod reduced systolic function, PAP 58, small pericardial effusion). Hx OSA on home CPAP Probable OHS - HHFNC (OptiFlow), currently 60L/FiO2 100% - Wean FiO2 as able for sat > 88%, have not been able to wean - BiPAP QHS + PRN - Aggressive pulmonary toileting/hygiene (Chest PT, IS, flutter valve) - Aggressive diuresis per AHF, goal net negative (-4.8L/24H) - Cefepime x 5-7 day course for ?PNA - Remains DNR/DNI  Acute on chronic diastolic CHF/cor pulmonale  Pulmonary HTN Group 1, II, III - Followed by Dr. Gilles Chiquito at [facility] Hx Afib on Xarelto, HLD Echo 1/29 with EF 60-65%, G1DD, D-shaped septum c/w volume overload/RV pressure, moderately elevated PASP. - AHF team following, appreciate recs - CVP running 9-12 - Lasix, metolazone, acetazolamide per AHF - Continue home Remodulin (via PIV), sildenafil - D/w [facility] re: transition to PO, appreciate pharmacy assistance; though query if this is late/end-stage pHTN with little room for improvement  Hx Afib on Xarelto, HLD - Continue heparin gtt - Transition back to Xarelto as able - Cardiac monitoring  MSSA  bacteremia in the setting of likely endocarditis Echo 1/29 without vegetations. - ID following, appreciate recommendations - Ancef for MSSA bacteremia - Cefepime for broadened HCAP coverage, 5-7 day course - Follow Cx data, BCx repeated post-line removal - Unable to obtain TEE due to tenuous respiratory status, likely would not change treatment  Abdominal distention - likely ascites from hypervolemia - Diuresis as above - No drainable pockets of ascites on bedside POCUS 1/31  Chronic venous insufficiency / venous stasis LE Dopplers 1/29 negative for DVT. - Leg wraps - Unna boots  Hx DM - SSI - CBGs Q4H - Goal CBG 140-180  Hx GERD - PPI  GOC - Code Status remains DNR/DNI - PMT consulted, appreciate assistance - Worry patient's presentation is more advance HF/late stage/end stage pHTN and less endocarditis/PNA; unsure how much patient will improve despite the above interventions  Best Practice: (right click and "Reselect all SmartList Selections" daily)   Diet/type: NPO, sips with meds DVT prophylaxis: systemic heparin GI prophylaxis: PPI Lines: N/A Foley:  N/A Code Status:  full code Last date of multidisciplinary goals of care discussion [1/28 updated patient at bedside. Attempted to update son Legrand Como over phone but no answer]  Critical care time: 34 minutes   The patient is critically ill with multiple organ  system failure and requires high complexity decision making for assessment and support, frequent evaluation and titration of therapies, advanced monitoring, review of radiographic studies and interpretation of complex data.   Critical Care Time devoted to patient care services, exclusive of separately billable procedures, described in this note is 34 minutes.  Lestine Mount, PA-C [facility] 02/05/22 8:32 AM  Please see [email or web address] for pager details.  From 7A-7P if no response, please call [phone] After hours, please call ELink  [phone]

## 2022-02-05 NOTE — Progress Notes (Signed)
eLink Physician-Brief Progress Note Patient Name: [name] DOB: 12/04/1960 MRN: [id number]   Date of Service  02/05/2022  HPI/Events of Note  Hypotension in setting of advanced rt heart failure on pulm vasodilators and being diuresed and infection.  eICU Interventions  Gentle return of volume Tolerate lower bp Consider low dose levophed     Intervention Category Major Interventions: Hypotension - evaluation and management  Mauri Brooklyn, P 02/05/2022, 11:31 PM

## 2022-02-05 NOTE — Progress Notes (Addendum)
1550 - Pt has been off Bipap and back on HHFN for about 15 minutes, desat to 83-83, Ailene Rud. PA aware and ok with this. Dr. Tamala Julian also aware and ok with this, comfortable with patient eating. Pt desat to about 78% while eating, per Colletta Maryland R. PA place back on Bipap after pt finished eating if pt will allow. Pt informed, aware, and agreeable.  1615- Pt placed back on Bipap

## 2022-02-06 DIAGNOSIS — R7881 Bacteremia: Secondary | ICD-10-CM | POA: Diagnosis not present

## 2022-02-06 DIAGNOSIS — B9561 Methicillin susceptible Staphylococcus aureus infection as the cause of diseases classified elsewhere: Secondary | ICD-10-CM | POA: Diagnosis not present

## 2022-02-06 LAB — CBC
HCT: 35.4 % — ABNORMAL LOW (ref 36.0–46.0)
Hemoglobin: 11.9 g/dL — ABNORMAL LOW (ref 12.0–15.0)
MCH: 29.2 pg (ref 26.0–34.0)
MCHC: 33.6 g/dL (ref 30.0–36.0)
MCV: 87 fL (ref 80.0–100.0)
Platelets: 84 10*3/uL — ABNORMAL LOW (ref 150–400)
RBC: 4.07 MIL/uL (ref 3.87–5.11)
RDW: 17.3 % — ABNORMAL HIGH (ref 11.5–15.5)
WBC: 22.6 10*3/uL — ABNORMAL HIGH (ref 4.0–10.5)
nRBC: 0.1 % (ref 0.0–0.2)

## 2022-02-06 LAB — BASIC METABOLIC PANEL
Anion gap: 9 (ref 5–15)
BUN: 35 mg/dL — ABNORMAL HIGH (ref 6–20)
CO2: 33 mmol/L — ABNORMAL HIGH (ref 22–32)
Calcium: 8.2 mg/dL — ABNORMAL LOW (ref 8.9–10.3)
Chloride: 90 mmol/L — ABNORMAL LOW (ref 98–111)
Creatinine, Ser: 1.06 mg/dL — ABNORMAL HIGH (ref 0.44–1.00)
GFR, Estimated: >60 mL/min (ref >60)
Glucose, Bld: 131 mg/dL — ABNORMAL HIGH (ref 70–99)
Potassium: 3.8 mmol/L (ref 3.5–5.1)
Sodium: 132 mmol/L — ABNORMAL LOW (ref 135–145)

## 2022-02-06 LAB — GLUCOSE, CAPILLARY
Glucose-Capillary: 119 mg/dL — ABNORMAL HIGH (ref 70–99)
Glucose-Capillary: 123 mg/dL — ABNORMAL HIGH (ref 70–99)
Glucose-Capillary: 123 mg/dL — ABNORMAL HIGH (ref 70–99)
Glucose-Capillary: 131 mg/dL — ABNORMAL HIGH (ref 70–99)
Glucose-Capillary: 167 mg/dL — ABNORMAL HIGH (ref 70–99)
Glucose-Capillary: 209 mg/dL — ABNORMAL HIGH (ref 70–99)

## 2022-02-06 LAB — MAGNESIUM: Magnesium: 2.2 mg/dL (ref 1.7–2.4)

## 2022-02-06 LAB — PHOSPHORUS: Phosphorus: 4.2 mg/dL (ref 2.5–4.6)

## 2022-02-06 LAB — HEPARIN LEVEL (UNFRACTIONATED): Heparin Unfractionated: 0.38 IU/mL (ref 0.30–0.70)

## 2022-02-06 LAB — LACTIC ACID, PLASMA: Lactic Acid, Venous: 0.9 mmol/L (ref 0.5–1.9)

## 2022-02-06 MED ORDER — NOREPINEPHRINE 4 MG/250ML-% IV SOLN
2.0000 ug/min | INTRAVENOUS | Status: DC
  Administered 2022-02-06: 2 ug/min via INTRAVENOUS
  Filled 2022-02-06: qty 250

## 2022-02-06 MED ORDER — LOPERAMIDE HCL 2 MG PO CAPS
4.0000 mg | ORAL_CAPSULE | ORAL | Status: DC | PRN
  Administered 2022-02-06: 4 mg via ORAL
  Filled 2022-02-06: qty 2

## 2022-02-06 MED ORDER — LACTATED RINGERS IV BOLUS
250.0000 mL | Freq: Once | INTRAVENOUS | Status: AC
  Administered 2022-02-06: 250 mL via INTRAVENOUS

## 2022-02-06 MED ORDER — SODIUM CHLORIDE 0.9 % IV SOLN
250.0000 mL | INTRAVENOUS | Status: DC
  Administered 2022-02-06: 250 mL via INTRAVENOUS

## 2022-02-06 NOTE — Progress Notes (Signed)
RT NOTE: patient asleep on bipap this AM.  No distress noted at this time.  Will continue to monitor and wean off if tolerated.

## 2022-02-06 NOTE — Progress Notes (Addendum)
NAME:  [name], MRN:  [id number], DOB:  [birth year], LOS: 6 ADMISSION DATE:  01/30/2022, CONSULTATION DATE:  01/25/2022 REFERRING MD:  Regenia Skeeter - EDP CHIEF COMPLAINT:  Acute hypoxic resp failure; CHF vs. COPD exacerbation   History of Present Illness:  62 year old woman who presented to [facility] 1/28 for respiratory failure. PMHx significant for chronic diastolic HF, cor pulmonale, severe pulmonary hypertension (on Remodulin and sildenafil), A-fib on Xarelto, OSA on CPAP presents to [facility] ED on 5/28 with SOB.  Patient states her breathing has worsened over the past few days.  States she is not very compliant with her medications at home.  States her breathing has gotten a lot more swollen over the past few days making it harder on her breathing.  On 1/28 her breathing was much worse and called EMS.  EMS found patient with sats 72% on room air and initially placed on O2 but required CPAP.  Upon arrival to [facility] ED on 1/28, patient in acute respiratory distress on BiPAP.  Initial BP 99/50.  RR 30s.  Afebrile but wbc 25.4 EKG with no signs of ischemia.  Given 80 IV Lasix. CXR w/ R basilar opacity. Given rocephin/azithro for possible cap.  Cultures obtained.  HF notified by ED physician for consult. PCCM consulted for ICU admission.  Pertinent ED labs: COVID/flu/RSV negative, NA 128, potassium 2.5, mag 1.8, creat 1.03, BNP 449,Troponin 371 troponin 371 then 373, VBG 7.46, 39, 50, 28.  Pertinent Medical History:   Past Medical History:  Diagnosis Date   Acute on chronic diastolic (congestive) heart failure ([place]) 06/25/2013   COPD (chronic obstructive pulmonary disease) (HCC)    Cor pulmonale (HCC)    PA Peak pressure 22mHg   Depression    Diabetes mellitus    well controlled on metformin   External hemorrhoid 01/11/2020   GERD (gastroesophageal reflux disease)    H/O mental retardation    Hyperlipidemia    Hypertension    OSA (obstructive sleep apnea)    CPAP   Pulmonary hypertension (HCC)     Restrictive lung disease    PFTs 06/2012 (FVC 54% predicted and FEV1 68% predicted w minimal bronchodilator response).   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    chornic, ?followed up at wound care center, multiple courses of antibiotics in past for cellulitis, on lasix   Significant Hospital Events: Including procedures, antibiotic start and stop dates in addition to other pertinent events   1/28 Admitted likely CHF exacerbation on bipap 1/29 Remains on BiPAP, trialed HFNC but desaturated 1/30 Off of BiPAP in AM, tolerated HHFNC all day, fatigued in afternoon with desats 1/31 Off of BiPAP, less reserve today; sats ~85%. CXR with worsening bibasilar airspace disease,  L > R. Abx broadened to cefepime; aggressive pulm toilet/diuresis per AHF team. Net -2.3L. 2/01 Remains on OptiFlow, unable to wean. Ongoing aggressive diuresis. Tunneled line removed at bedside by IR. 2/02 Stable O2 requirement, no significant changes. Remodulin to PIV.  Interim History / Subjective:  Now nearly completely BIPAP dependent. Bps more soft.  Objective:  Blood pressure (!) 93/51, pulse 93, temperature 98.1 F (36.7 C), temperature source Oral, resp. rate 20, height '5\' 3"'$  (1.6 m), weight 114.3 kg, last menstrual period 02/19/2011, SpO2 (!) 88 %.    FiO2 (%):  [100 %] 100 %   Intake/Output Summary (Last 24 hours) at 02/06/2022 1024 Last data  filed at 02/06/2022 1000 Gross per 24 hour  Intake 2169.77 ml  Output 2650 ml  Net -480.23 ml    Filed Weights   02/04/22 0500 02/05/22 0500 02/06/22 0500  Weight: 115.4 kg 112 kg 114.3 kg   Physical Examination: Ill appearing tachypneic on BIPAP Follows commands Abd soft Ext warm Lung sounds distant   Assessment & Plan:  Acute hypoxemic resp failure- frail baseline with superimposed decompensated heart failure, possible HCAP Severe pulmonary HTN groups  1/2/3 Decompensated cor pulmonale- unfortunately not really improving PICC line associated MSSA bacteremia- PICC removed 2/1 Afib on AC PTA  - Continue BIPAP qHS and PRN - Levophed for MAP 65 - IV and PO pulmonary vasodilators as ordered - Diuretics per CHF team discretion - Son is coming in at [place], may need to consider transition to comfort sooner rather than later given worsening resp status  Best Practice: (right click and "Reselect all SmartList Selections" daily)   Diet/type: reg DVT prophylaxis: systemic heparin GI prophylaxis: PPI Lines: N/A Foley:  N/A Code Status:  full code Last date of multidisciplinary goals of care discussion [son coming in today so we can talk]  Critical care time: 32 minutes   Erskine Emery MD PCCM

## 2022-02-06 NOTE — Progress Notes (Signed)
[place] for Heparin (Xarelto on hold) Indication: atrial fibrillation  Allergies  Allergen Reactions   Aspirin Swelling    REACTION: airway swelling   Codeine Other (See Comments)    REACTION: tingling in lips and hard breathing - had reaction at dentist - states "I can't take certain kinds of codeine" - happened maybe 10 yr ago   Lisinopril Cough   Sulfonamide Derivatives Swelling    REACTION: airway swelling   Latex Rash   Patient Measurements: Height: '5\' 3"'$  (160 cm) Weight: 114.3 kg (251 lb 15.8 oz) IBW/kg (Calculated) : 52.4 Heparin dosing wt: 81 kg  Vital Signs: Temp: 99.1 F (37.3 C) (02/03 1149) Temp Source: Axillary (02/03 1149) BP: 98/52 (02/03 1430) Pulse Rate: 83 (02/03 1430)  Labs: Recent Labs    02/04/22 0506 02/04/22 1021 02/05/22 0717 02/05/22 2013 02/06/22 0258 02/06/22 0424  HGB 13.2  --  12.8  --  11.9*  --   HCT 38.7  --  37.9  --  35.4*  --   PLT 115*  --  92*  --  84*  --   HEPARINUNFRC 0.60  --  0.57  --   --  0.38  CREATININE  --    < > 0.86 1.09* 1.06*  --    < > = values in this interval not displayed.     Estimated Creatinine Clearance: 68.8 mL/min (A) (by C-G formula based on SCr of 1.06 mg/dL (H)).   Assessment: 62 y/o F with HF and PAH presents to the ED with shortness breath. She is on Xarelto PTA for afib. Holding Xarelto and starting Heparin. The patient states she hasn't taken her Xarelto in over one week.   Heparin level is therapeutic (0.38) on infusion at 2350 units/hr. Hgb 12 stable  plt down 84 in setting of infection. No issues with line or bleeding reported per RN.   Goal of Therapy:  Heparin level 0.3-0.7 units/ml Monitor platelets by anticoagulation protocol: Yes   Plan:  Continue heparin gtt at 2350 units/hr Monitor daily HL, CBC, and for s/sx of bleeding   Bonnita Nasuti Pharm.D. CPP, BCPS Clinical Pharmacist [phone] 02/06/2022 3:04 PM    Please check AMION for all  [place] phone numbers After 10:00 PM, call [place] [phone]

## 2022-02-06 NOTE — Progress Notes (Signed)
Okay per Dr. Charlsie Quest for patient to eat on [place]. Will allow for desaturations while patient is eating.

## 2022-02-06 NOTE — Progress Notes (Signed)
RT removed pt from BiPAP and placed pt on HHFNC 60L 100%. RN at bedside to assist. BiPAP left on stby at bedside. RT will continue to monitor pt.

## 2022-02-06 NOTE — Progress Notes (Signed)
Advanced Heart Failure Rounding Note  PCP-Cardiologist: Dr. Haroldine Laws    Subjective:    On abx for suspected aspiration PNA and bacteremia. BCx from 1/28 + for Staph aureus. Echo (02/01/22) with EF 60-65%, D-shaped septum, moderate RV enlargement with moderately decreased systolic function, PASP 59, dilated IVC, no definite valvular vegetation.   1/31 Abx changed  to cefepime.  2/1 Diuresed IV lasix+metolazone+ diamox. Tunneled PICC removed.    On IV treprostinil via PIV + sildenafil for PH.   Blood Cx- 2/1---> 1/2 GPC  Remains on OptiFlow. Sats remain in low 80s.   Remains SOB. IV lasix stopped last night due to hypotension. Now on NE for BP support.   Objective:   Weight Range: 114.3 kg Body mass index is 44.64 kg/m.   Vital Signs:   Temp:  [98.1 F (36.7 C)-99.1 F (37.3 C)] 99.1 F (37.3 C) (02/03 1149) Pulse Rate:  [80-102] 93 (02/03 1144) Resp:  [12-38] 29 (02/03 1144) BP: (74-114)/(32-81) 101/54 (02/03 1130) SpO2:  [79 %-96 %] 91 % (02/03 1144) FiO2 (%):  [100 %] 100 % (02/03 1144) Weight:  [114.3 kg] 114.3 kg (02/03 0500) Last BM Date : 02/04/22  Weight change: Filed Weights   02/04/22 0500 02/05/22 0500 02/06/22 0500  Weight: 115.4 kg 112 kg 114.3 kg    Intake/Output:   Intake/Output Summary (Last 24 hours) at 02/06/2022 1158 Last data filed at 02/06/2022 1100 Gross per 24 hour  Intake 2179.8 ml  Output 1750 ml  Net 429.8 ml       Physical Exam   General:  Obese woman on OptiFlow. Chronically ill appearing dyspneic HEENT: normal + optiflow. flushed  Neck: supple. Unable to see JVP . Carotids 2+ bilat; no bruits. No lymphadenopathy or thryomegaly appreciated. Cor: PMI nondisplaced. Regular rate & rhythm. No rubs, gallops or murmurs. Lungs: coarse Abdomen: obese soft, nontender, nondistended. No hepatosplenomegaly. No bruits or masses. Good bowel sounds. Extremities: no cyanosis, clubbing, rash, tr edema + UNNA Neuro: alert & orientedx3,  cranial nerves grossly intact. moves all 4 extremities w/o difficulty. Affect pleasant  Telemetry   SR 80-100 Personally reviewed  Labs    CBC Recent Labs    02/05/22 0717 02/06/22 0258  WBC 23.3* 22.6*  NEUTROABS 17.1*  --   HGB 12.8 11.9*  HCT 37.9 35.4*  MCV 85.6 87.0  PLT 92* 84*    Basic Metabolic Panel Recent Labs    02/05/22 0717 02/05/22 2013 02/06/22 0258  NA 133* 133* 132*  K 3.3* 3.6 3.8  CL 89* 88* 90*  CO2 32 33* 33*  GLUCOSE 116* 169* 131*  BUN 29* 32* 35*  CREATININE 0.86 1.09* 1.06*  CALCIUM 8.2* 8.4* 8.2*  MG 2.2  --  2.2  PHOS 3.7  --  4.2    Liver Function Tests Recent Labs    02/04/22 1021  AST 27  ALT 10  ALKPHOS 56  BILITOT 0.8  PROT 5.7*  ALBUMIN 2.4*     No results for input(s): "LIPASE", "AMYLASE" in the last 72 hours. Cardiac Enzymes No results for input(s): "CKTOTAL", "CKMB", "CKMBINDEX", "TROPONINI" in the last 72 hours.  BNP: BNP (last 3 results) Recent Labs    07/02/21 2136 01/28/2022 1940  BNP 29.1 449.8*     ProBNP (last 3 results) No results for input(s): "PROBNP" in the last 8760 hours.   D-Dimer No results for input(s): "DDIMER" in the last 72 hours.  Hemoglobin A1C No results for input(s): "HGBA1C" in the last  72 hours.  Fasting Lipid Panel No results for input(s): "CHOL", "HDL", "LDLCALC", "TRIG", "CHOLHDL", "LDLDIRECT" in the last 72 hours. Thyroid Function Tests No results for input(s): "TSH", "T4TOTAL", "T3FREE", "THYROIDAB" in the last 72 hours.  Invalid input(s): "FREET3"  Other results:   Imaging    No results found.   Medications:     Scheduled Medications:  Chlorhexidine Gluconate Cloth  6 each Topical Daily   furosemide  80 mg Intravenous BID   gabapentin  600 mg Oral TID   insulin aspart  0-9 Units Subcutaneous Q4H   liver oil-zinc oxide   Topical BID   mouth rinse  15 mL Mouth Rinse 4 times per day   pantoprazole  20 mg Oral BID   pravastatin  20 mg Oral Daily    sildenafil  20 mg Oral TID   sodium chloride flush  10-40 mL Intracatheter Q12H    Infusions:  sodium chloride Stopped (02/01/22 1154)   sodium chloride 10 mL/hr at 02/06/22 1100   ceFEPime (MAXIPIME) IV Stopped (02/06/22 1287)   Followed by   Derrill Memo ON 02/10/2022]  ceFAZolin (ANCEF) IV     heparin 2,350 Units/hr (02/06/22 1100)   norepinephrine (LEVOPHED) Adult infusion Stopped (02/06/22 0955)   treprostinil (REMODULIN) 35,000,000 ng in pH 12 sterile diluent 100 mL (350,000 ng/mL) infusion 81 ng/kg/min (02/05/22 0534)    PRN Medications: acetaminophen, docusate sodium, Gerhardt's butt cream, ipratropium-albuterol, ondansetron (ZOFRAN) IV, mouth rinse, phenol, polyethylene glycol, sodium chloride flush    Patient Profile   [name] is a 62 y.o. female with a hx of morbid obesity, cognitive impairment, O6VE, chronic diastolic HF, pAF (Xarelto), severe pulmonary HTN (on combination therapy with treprostinil and sildenafil) who is being seen today for the evaluation of chronic HF and PH therapies in setting of acute hypoxic respiratory failure requiring CICU admission.   Assessment/Plan   1. Acute on chronic hypoxemic respiratory failure: Baseline OHS/OSA on 4L home oxygen. Currently on Bipap.  She is being treating for community-acquired PNA on a baseline of pulmonary hypertension, OHS/OSA, COPD as well as RV failure.  - Continue abx per CCM.  - Continues on home IV treprostinil and sildenafil.  - She remains very tenuous despite maximum intervention. Sats low on Optiflow. Does not want intubation - CCM had family meeting today to discuss possible transition to comfort care if sats not improving - Diuretics now off due to hypotension. Will continue to hold 2. Pulmonary hypertension: Suspect mixed group 1, 2, and 3 (OHS/OSA, COPD).   [facility] at [facility].  - Continue home IV treprostinil.  - Continue sildenafil 20 tid.  - Can consider RHC as needed but doubt will change  treatment plan or outcome at this point  3. Acute on chronic cor pulmonale: Echo in 6/23 with EF > 55%, moderate RV dysfunction. Echo this admission with EF 60-65%, D-shaped septum, RV moderately enlarged with moderately decreased systolic function, PASP 59.  - Off lasix due to hypotension - Continue NE support - continue sildenafil for now. May need to stop with SBP < 80.  4. PNA: Suspect community-acquired PNA with PCT 2.02 and WBCs 25.  RLL infiltrate on CXR.  - On cefepime per CCM 5. Atrial fibrillation: Paroxysmal, on Xarelto at home. Maintaining SR.  - Continue heparin gtt for now.  6. Elevated Troponin: HS-TnI 371 => 299.  Suspect demand ischemia from hypoxemia and volume overload.  HS-TnI in 1000s range in 2021, cath at that time showed minimal CAD. 7.  Bacteremia: BCx from 1/28 + for staph aureus. Initially started on Ancef. Abx changed to cefepime per CCM   -Tunneled PICC removed.  - TTE this admit w/ no clear vegetations  - Would not do TEE (high risk procedure in this setting), per ID will treat like endocarditis regardless - Blood Cx 2/1 --1/2 GPC .  8. Max DNR . May need Palliative Care.   She is likely end-stage. Agree with transition to comfort care if sats not improving.   D/w CCM.   CRITICAL CARE Performed by: Glori Bickers  Total critical care time: 40 minutes  Critical care time was exclusive of separately billable procedures and treating other patients.  Critical care was necessary to treat or prevent imminent or life-threatening deterioration.  Critical care was time spent personally by me (independent of midlevel providers or residents) on the following activities: development of treatment plan with patient and/or surrogate as well as nursing, discussions with consultants, evaluation of patient's response to treatment, examination of patient, obtaining history from patient or surrogate, ordering and performing treatments and interventions, ordering and review  of laboratory studies, ordering and review of radiographic studies, pulse oximetry and re-evaluation of patient's condition.   Length of Stay: [place], MD  02/06/2022, 11:58 AM  Advanced Heart Failure Team Pager [phone] (M-F; 7a - 5p)  Please contact [facility] for night-coverage after hours (5p -7a ) and weekends on [email or web address]

## 2022-02-06 NOTE — Progress Notes (Signed)
CCM at bedside for morning rounds. Ask RN to call patient son [name] to come to bedside to have discussion of patient status. [name] called and plans to come in at around 1100 am. Clarification received to not give any medications including 80 lasix until Dr. Tamala Julian speaks to patient son [name]. Holding PO meds at this time and lasix. Patient remains resting comfortably on bipap.

## 2022-02-07 DIAGNOSIS — R7881 Bacteremia: Secondary | ICD-10-CM | POA: Diagnosis not present

## 2022-02-07 DIAGNOSIS — B9561 Methicillin susceptible Staphylococcus aureus infection as the cause of diseases classified elsewhere: Secondary | ICD-10-CM | POA: Diagnosis not present

## 2022-02-07 LAB — CBC
HCT: 36 % (ref 36.0–46.0)
Hemoglobin: 12 g/dL (ref 12.0–15.0)
MCH: 28.8 pg (ref 26.0–34.0)
MCHC: 33.3 g/dL (ref 30.0–36.0)
MCV: 86.3 fL (ref 80.0–100.0)
Platelets: 74 10*3/uL — ABNORMAL LOW (ref 150–400)
RBC: 4.17 MIL/uL (ref 3.87–5.11)
RDW: 17.2 % — ABNORMAL HIGH (ref 11.5–15.5)
WBC: 18.7 10*3/uL — ABNORMAL HIGH (ref 4.0–10.5)
nRBC: 0.1 % (ref 0.0–0.2)

## 2022-02-07 LAB — CULTURE, BLOOD (ROUTINE X 2): Special Requests: ADEQUATE

## 2022-02-07 LAB — BASIC METABOLIC PANEL
Anion gap: 8 (ref 5–15)
BUN: 34 mg/dL — ABNORMAL HIGH (ref 6–20)
CO2: 34 mmol/L — ABNORMAL HIGH (ref 22–32)
Calcium: 8.2 mg/dL — ABNORMAL LOW (ref 8.9–10.3)
Chloride: 90 mmol/L — ABNORMAL LOW (ref 98–111)
Creatinine, Ser: 0.72 mg/dL (ref 0.44–1.00)
GFR, Estimated: >60 mL/min (ref >60)
Glucose, Bld: 109 mg/dL — ABNORMAL HIGH (ref 70–99)
Potassium: 3.5 mmol/L (ref 3.5–5.1)
Sodium: 132 mmol/L — ABNORMAL LOW (ref 135–145)

## 2022-02-07 LAB — GLUCOSE, CAPILLARY
Glucose-Capillary: 103 mg/dL — ABNORMAL HIGH (ref 70–99)
Glucose-Capillary: 109 mg/dL — ABNORMAL HIGH (ref 70–99)
Glucose-Capillary: 110 mg/dL — ABNORMAL HIGH (ref 70–99)
Glucose-Capillary: 139 mg/dL — ABNORMAL HIGH (ref 70–99)
Glucose-Capillary: 165 mg/dL — ABNORMAL HIGH (ref 70–99)
Glucose-Capillary: 205 mg/dL — ABNORMAL HIGH (ref 70–99)

## 2022-02-07 LAB — HEPARIN LEVEL (UNFRACTIONATED): Heparin Unfractionated: 0.44 IU/mL (ref 0.30–0.70)

## 2022-02-07 MED ORDER — MIDODRINE HCL 5 MG PO TABS
5.0000 mg | ORAL_TABLET | Freq: Three times a day (TID) | ORAL | Status: DC
  Administered 2022-02-07 – 2022-02-08 (×5): 5 mg via ORAL
  Filled 2022-02-07 (×5): qty 1

## 2022-02-07 MED ORDER — POLYMYXIN B-TRIMETHOPRIM 10000-0.1 UNIT/ML-% OP SOLN
1.0000 [drp] | Freq: Four times a day (QID) | OPHTHALMIC | Status: DC
  Administered 2022-02-08 – 2022-02-10 (×6): 1 [drp] via OPHTHALMIC
  Filled 2022-02-07 (×2): qty 10

## 2022-02-07 MED ORDER — POTASSIUM CHLORIDE CRYS ER 20 MEQ PO TBCR
40.0000 meq | EXTENDED_RELEASE_TABLET | Freq: Two times a day (BID) | ORAL | Status: DC
  Administered 2022-02-07 – 2022-02-08 (×3): 40 meq via ORAL
  Filled 2022-02-07 (×3): qty 2

## 2022-02-07 MED ORDER — POTASSIUM CHLORIDE 20 MEQ PO PACK
40.0000 meq | PACK | Freq: Two times a day (BID) | ORAL | Status: DC
  Administered 2022-02-07: 40 meq via ORAL
  Filled 2022-02-07: qty 2

## 2022-02-07 NOTE — Progress Notes (Signed)
RN called RT stating she had placed pt back on BiPAP. RT at bedside to assess pt. Pt is tolerating BiPAP well at this time. [place] on stby at bedside. RT will continue to monitor pt.

## 2022-02-07 NOTE — Progress Notes (Signed)
NAME:  [name], MRN:  [id number], DOB:  [birth year], LOS: 7 ADMISSION DATE:  01/17/2022, CONSULTATION DATE:  01/12/2022 REFERRING MD:  Regenia Skeeter - EDP CHIEF COMPLAINT:  Acute hypoxic resp failure; CHF vs. COPD exacerbation   History of Present Illness:  62 year old woman who presented to [facility] 1/28 for respiratory failure. PMHx significant for chronic diastolic HF, cor pulmonale, severe pulmonary hypertension (on Remodulin and sildenafil), A-fib on Xarelto, OSA on CPAP presents to [facility] ED on 5/28 with SOB.  Patient states her breathing has worsened over the past few days.  States she is not very compliant with her medications at home.  States her breathing has gotten a lot more swollen over the past few days making it harder on her breathing.  On 1/28 her breathing was much worse and called EMS.  EMS found patient with sats 72% on room air and initially placed on O2 but required CPAP.  Upon arrival to [facility] ED on 1/28, patient in acute respiratory distress on BiPAP.  Initial BP 99/50.  RR 30s.  Afebrile but wbc 25.4 EKG with no signs of ischemia.  Given 80 IV Lasix. CXR w/ R basilar opacity. Given rocephin/azithro for possible cap.  Cultures obtained.  HF notified by ED physician for consult. PCCM consulted for ICU admission.  Pertinent ED labs: COVID/flu/RSV negative, NA 128, potassium 2.5, mag 1.8, creat 1.03, BNP 449,Troponin 371 troponin 371 then 373, VBG 7.46, 39, 50, 28.  Pertinent Medical History:   Past Medical History:  Diagnosis Date   Acute on chronic diastolic (congestive) heart failure ([place]) 06/25/2013   COPD (chronic obstructive pulmonary disease) (HCC)    Cor pulmonale (HCC)    PA Peak pressure 32mHg   Depression    Diabetes mellitus    well controlled on metformin   External hemorrhoid 01/11/2020   GERD (gastroesophageal reflux disease)    H/O mental retardation    Hyperlipidemia    Hypertension    OSA (obstructive sleep apnea)    CPAP   Pulmonary hypertension (HCC)     Restrictive lung disease    PFTs 06/2012 (FVC 54% predicted and FEV1 68% predicted w minimal bronchodilator response).   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    chornic, ?followed up at wound care center, multiple courses of antibiotics in past for cellulitis, on lasix   Significant Hospital Events: Including procedures, antibiotic start and stop dates in addition to other pertinent events   1/28 Admitted likely CHF exacerbation on bipap 1/29 Remains on BiPAP, trialed HFNC but desaturated 1/30 Off of BiPAP in AM, tolerated HHFNC all day, fatigued in afternoon with desats 1/31 Off of BiPAP, less reserve today; sats ~85%. CXR with worsening bibasilar airspace disease,  L > R. Abx broadened to cefepime; aggressive pulm toilet/diuresis per AHF team. Net -2.3L. 2/01 Remains on OptiFlow, unable to wean. Ongoing aggressive diuresis. Tunneled line removed at bedside by IR. 2/02 Stable O2 requirement, no significant changes. Remodulin to PIV. 2/3 nearly obtunded, GOC talk then she perked up again  Interim History / Subjective:  More awake this am. Continues to diurese. On some levophed.  Objective:  Blood pressure (!) 107/58, pulse 93, temperature 97.8 F (36.6 C), temperature source Axillary, resp. rate (!) 23, height '5\' 3"'$  (1.6 m), weight 114.3 kg, last menstrual period 02/19/2011, SpO2 95 %.    FiO2 (%):  [85 %-100 %] 85 %  Intake/Output Summary (Last 24 hours) at 02/07/2022 1105 Last data filed at 02/07/2022 0800 Gross per 24 hour  Intake 973.35 ml  Output 2778 ml  Net -1804.65 ml    Filed Weights   02/04/22 0500 02/05/22 0500 02/06/22 0500  Weight: 115.4 kg 112 kg 114.3 kg   Physical Examination: Mildly tachypneic Optiflow in place Stable facial plethora Stable chronic venous stasis changes Aox3  Sugars okay Renal function improved Plts drifting down  slowly  Assessment & Plan:  Acute hypoxemic resp failure- frail baseline with superimposed decompensated heart failure, possible HCAP Severe pulmonary HTN groups 1/2/3 Decompensated cor pulmonale- unfortunately not really improving PICC line associated MSSA bacteremia- PICC removed 2/1 Afib on AC PTA Thrombocytopenia- timing and trajectory not as c/w HIT, question septic response vs sequestration of end stage heart failure  - Continue BIPAP qHS and PRN - Levophed for MAP 65, midodrine started - IV and PO pulmonary vasodilators as ordered - Diuretics per CHF team discretion - Check AM DIC panel, HIT screen, LFTs - Had frank talk with son yesterday that I didn't think she was going to live through hospitalization.  Patient subsequently perked back up today.  Maybe with euvolemia we can start weaning oxygen?  Don't know, she is getting really deconditioned.  She has told both [place] and son that she wants transition to comfort should she become encephalopathic.  Best Practice: (right click and "Reselect all SmartList Selections" daily)   Diet/type: reg DVT prophylaxis: systemic heparin GI prophylaxis: PPI Lines: N/A Foley:  N/A Code Status: DNR Last date of multidisciplinary goals of care discussion [02/06/22 w/ son and patient]  Critical care time: 29mnutes   DErskine EmeryMD PCCM

## 2022-02-07 NOTE — Progress Notes (Signed)
Dear Doctor: This patient has been identified as a candidate for PICC/CVC for the following reason (s): IV therapy over 48 hours, drug pH or osmolality (causing phlebitis, infiltration in 24 hours), poor veins/poor circulatory system (CHF, COPD, emphysema, diabetes, steroid use, IV drug abuse, etc.), restarts due to phlebitis and infiltration in 24 hours, and incompatible drugs (aminophyllin, TPN, heparin, given with an antibiotic) If you agree, please write an order for the indicated device.   Thank you for supporting the early vascular access assessment program.

## 2022-02-07 NOTE — Progress Notes (Signed)
RT removed pt from BiPAP and placed on HHFNC 40L 100%. Pt tolerating well at this time with SVS. RT left BiPAP on stby at bedside. RN notified.

## 2022-02-07 NOTE — Progress Notes (Signed)
[facility] Progress Note Patient Name: [name] DOB: 10-Oct-1960 MRN: [id number]   Date of Service  02/07/2022  HPI/Events of Note  Pt with conjunctivitis, noted to have yellow-green discharge. No vision changes reported.   eICU Interventions  Placed order for trimethoprim-polymixin eye drops x 5 days.  Will continue to monitor closely.         [place] 02/07/2022, 9:48 PM

## 2022-02-07 NOTE — Progress Notes (Signed)
[facility] for Heparin (Xarelto on hold) Indication: atrial fibrillation  Allergies  Allergen Reactions   Aspirin Swelling    REACTION: airway swelling   Codeine Other (See Comments)    REACTION: tingling in lips and hard breathing - had reaction at dentist - states "I can't take certain kinds of codeine" - happened maybe 10 yr ago   Lisinopril Cough   Sulfonamide Derivatives Swelling    REACTION: airway swelling   Latex Rash   Patient Measurements: Height: '5\' 3"'$  (160 cm) Weight: 114.3 kg (251 lb 15.8 oz) IBW/kg (Calculated) : 52.4 Heparin dosing wt: 81 kg  Vital Signs: Temp: 97.8 F (36.6 C) (02/04 0808) Temp Source: Axillary (02/04 0808) BP: 107/58 (02/04 1000) Pulse Rate: 93 (02/04 1000)  Labs: Recent Labs    02/05/22 0717 02/05/22 2013 02/06/22 0258 02/06/22 0424 02/07/22 0655  HGB 12.8  --  11.9*  --  12.0  HCT 37.9  --  35.4*  --  36.0  PLT 92*  --  84*  --  74*  HEPARINUNFRC 0.57  --   --  0.38 0.44  CREATININE 0.86 1.09* 1.06*  --  0.72     Estimated Creatinine Clearance: 91.1 mL/min (by C-G formula based on SCr of 0.72 mg/dL).   Assessment: 62 y/o F with HF and PAH presents to the ED with shortness breath. She is on Xarelto PTA for afib. Holding Xarelto and starting Heparin. The patient states she hasn't taken her Xarelto in over one week.   Heparin level is therapeutic (0.44) on infusion at 2350 units/hr. Hgb 12 stable  plt down 70s in setting of infection. No issues with line or bleeding reported per RN.   Goal of Therapy:  Heparin level 0.3-0.7 units/ml Monitor platelets by anticoagulation protocol: Yes   Plan:  Continue heparin gtt at 2350 units/hr Monitor daily HL, CBC, and for s/sx of bleeding   Bonnita Nasuti Pharm.D. CPP, BCPS Clinical Pharmacist [phone] 02/07/2022 10:44 AM    Please check AMION for all [place] phone numbers After 10:00 PM, call [place] [phone]

## 2022-02-07 NOTE — Progress Notes (Signed)
Advanced Heart Failure Rounding Note  PCP-Cardiologist: Dr. Haroldine Laws    Subjective:    On abx for suspected aspiration PNA and bacteremia. BCx from 1/28 + for Staph aureus. Echo (02/01/22) with EF 60-65%, D-shaped septum, moderate RV enlargement with moderately decreased systolic function, PASP 59, dilated IVC, no definite valvular vegetation.   1/31 Abx changed  to cefepime.  2/1 Diuresed IV lasix+metolazone+ diamox. Tunneled PICC removed.    On IV treprostinil via PIV + sildenafil for PH.   Blood Cx --->  MSSA  Remains on OptiFlow. Sats remain in mid 64s.   Back on NE due to low BPs overnight  Says she feels ok. Denied SOB.   Objective:   Weight Range: 114.3 kg Body mass index is 44.64 kg/m.   Vital Signs:   Temp:  [97.8 F (36.6 C)-99.1 F (37.3 C)] 97.8 F (36.6 C) (02/04 0808) Pulse Rate:  [61-300] 93 (02/04 1000) Resp:  [15-33] 23 (02/04 1000) BP: (83-153)/(33-118) 107/58 (02/04 1000) SpO2:  [81 %-100 %] 95 % (02/04 1000) FiO2 (%):  [85 %-100 %] 85 % (02/04 0832) Last BM Date : 02/07/22  Weight change: Filed Weights   02/04/22 0500 02/05/22 0500 02/06/22 0500  Weight: 115.4 kg 112 kg 114.3 kg    Intake/Output:   Intake/Output Summary (Last 24 hours) at 02/07/2022 1040 Last data filed at 02/07/2022 0800 Gross per 24 hour  Intake 1006.9 ml  Output 2778 ml  Net -1771.1 ml       Physical Exam   General:  Obese woman on OptiFlow. Chronically ill appearing dyspneic HEENT: normal + optiflow cannula. flushed  Neck: supple. Unable to see JVD. Carotids 2+ bilat; no bruits. No lymphadenopathy or thryomegaly appreciated. Cor: PMI nondisplaced. Regular rate & rhythm. 2/6 TR Lungs: clear Abdomen: obese soft, nontender, nondistended. No hepatosplenomegaly. No bruits or masses. Good bowel sounds. Extremities: no cyanosis, clubbing, rash, 2+ edema + UNNA Neuro: alert & orientedx3, cranial nerves grossly intact. moves all 4 extremities w/o difficulty. Affect  pleasant  Telemetry   SR 90-100 Personally reviewed  Labs    CBC Recent Labs    02/05/22 0717 02/06/22 0258 02/07/22 0655  WBC 23.3* 22.6* 18.7*  NEUTROABS 17.1*  --   --   HGB 12.8 11.9* 12.0  HCT 37.9 35.4* 36.0  MCV 85.6 87.0 86.3  PLT 92* 84* 74*    Basic Metabolic Panel Recent Labs    02/05/22 0717 02/05/22 2013 02/06/22 0258 02/07/22 0655  NA 133*   < > 132* 132*  K 3.3*   < > 3.8 3.5  CL 89*   < > 90* 90*  CO2 32   < > 33* 34*  GLUCOSE 116*   < > 131* 109*  BUN 29*   < > 35* 34*  CREATININE 0.86   < > 1.06* 0.72  CALCIUM 8.2*   < > 8.2* 8.2*  MG 2.2  --  2.2  --   PHOS 3.7  --  4.2  --    < > = values in this interval not displayed.    Liver Function Tests No results for input(s): "AST", "ALT", "ALKPHOS", "BILITOT", "PROT", "ALBUMIN" in the last 72 hours.   No results for input(s): "LIPASE", "AMYLASE" in the last 72 hours. Cardiac Enzymes No results for input(s): "CKTOTAL", "CKMB", "CKMBINDEX", "TROPONINI" in the last 72 hours.  BNP: BNP (last 3 results) Recent Labs    07/02/21 2136 01/04/2022 1940  BNP 29.1 449.8*  ProBNP (last 3 results) No results for input(s): "PROBNP" in the last 8760 hours.   D-Dimer No results for input(s): "DDIMER" in the last 72 hours.  Hemoglobin A1C No results for input(s): "HGBA1C" in the last 72 hours.  Fasting Lipid Panel No results for input(s): "CHOL", "HDL", "LDLCALC", "TRIG", "CHOLHDL", "LDLDIRECT" in the last 72 hours. Thyroid Function Tests No results for input(s): "TSH", "T4TOTAL", "T3FREE", "THYROIDAB" in the last 72 hours.  Invalid input(s): "FREET3"  Other results:   Imaging    No results found.   Medications:     Scheduled Medications:  Chlorhexidine Gluconate Cloth  6 each Topical Daily   furosemide  80 mg Intravenous BID   gabapentin  600 mg Oral TID   insulin aspart  0-9 Units Subcutaneous Q4H   liver oil-zinc oxide   Topical BID   mouth rinse  15 mL Mouth Rinse 4 times  per day   pantoprazole  20 mg Oral BID   pravastatin  20 mg Oral Daily   sildenafil  20 mg Oral TID   sodium chloride flush  10-40 mL Intracatheter Q12H    Infusions:  sodium chloride Stopped (02/01/22 1154)   sodium chloride 10 mL/hr at 02/07/22 0800   ceFEPime (MAXIPIME) IV 2 g (02/07/22 0819)   Followed by   Derrill Memo ON 02/10/2022]  ceFAZolin (ANCEF) IV     heparin 2,350 Units/hr (02/07/22 0800)   norepinephrine (LEVOPHED) Adult infusion 6 mcg/min (02/07/22 0800)   treprostinil (REMODULIN) 35,000,000 ng in pH 12 sterile diluent 100 mL (350,000 ng/mL) infusion 81 ng/kg/min (02/06/22 1555)    PRN Medications: acetaminophen, docusate sodium, Gerhardt's butt cream, ipratropium-albuterol, loperamide, ondansetron (ZOFRAN) IV, mouth rinse, phenol, polyethylene glycol, sodium chloride flush    Patient Profile   [name] is a 62 y.o. female with a hx of morbid obesity, cognitive impairment, S8NI, chronic diastolic HF, pAF (Xarelto), severe pulmonary HTN (on combination therapy with treprostinil and sildenafil) who is being seen today for the evaluation of chronic HF and PH therapies in setting of acute hypoxic respiratory failure requiring CICU admission.   Assessment/Plan   1. Acute on chronic hypoxemic respiratory failure: Baseline OHS/OSA on 4L home oxygen. Currently on Bipap.  She is being treating for community-acquired PNA on a baseline of pulmonary hypertension, OHS/OSA, COPD as well as RV failure.  - Continues on home IV treprostinil and sildenafil.  - She remains very tenuous despite maximum intervention. Sats marginal on Optiflow. Does not want intubation - CCM family meeting 2/3 to discuss possible transition to comfort care if sats not improving - Diuretics now off due to hypotension. Will give dose IV lasix today - May nee to consider repeat RHC to better understand hemmodynamics and provide further prognostication  2. Pulmonary hypertension: Suspect mixed group 1, 2, and  3 (OHS/OSA, COPD).   She is followed at [facility].  - Continue home IV treprostinil.  - Continue sildenafil 20 tid.  - Can consider RHC as above - Given multiple line infections recently may need to reconsider IV trepostinil therapy 3. Acute on chronic cor pulmonale: Echo in 6/23 with EF > 55%, moderate RV dysfunction. Echo this admission with EF 60-65%, D-shaped septum, RV moderately enlarged with moderately decreased systolic function, PASP 59.  - Off lasix due to hypotension. Will give one dose today - Continue NE support. Add midodrine 5 tid - continue sildenafil for now. May need to stop with SBP < 80.  4. PNA: Suspect community-acquired PNA with PCT 2.02 and  WBCs 25.  RLL infiltrate on CXR.  - On cefepime per CCM 5. Atrial fibrillation: Paroxysmal, on Xarelto at home. Maintaining SR.  - Continue heparin gtt for now.  - watch platelets 6. Elevated Troponin: HS-TnI 371 => 299.  Suspect demand ischemia from hypoxemia and volume overload.  HS-TnI in 1000s range in 2021, cath at that time showed minimal CAD. 7. Bacteremia: BCx from 1/28 + for staph aureus. Initially started on Ancef. Abx changed to cefepime per CCM   -Tunneled PICC removed.  - TTE this admit w/ no clear vegetations  - Would not do TEE (high risk procedure in this setting), per ID will treat like endocarditis regardless - Blood Cx 2/1 + MSSA 8. GOC DNR . May need Palliative Care.   CRITICAL CARE Performed by: Glori Bickers  Total critical care time: 40 minutes  Critical care time was exclusive of separately billable procedures and treating other patients.  Critical care was necessary to treat or prevent imminent or life-threatening deterioration.  Critical care was time spent personally by me (independent of midlevel providers or residents) on the following activities: development of treatment plan with patient and/or surrogate as well as nursing, discussions with consultants, evaluation of patient's response to  treatment, examination of patient, obtaining history from patient or surrogate, ordering and performing treatments and interventions, ordering and review of laboratory studies, ordering and review of radiographic studies, pulse oximetry and re-evaluation of patient's condition.   Length of Stay: [place], MD  02/07/2022, 10:40 AM  Advanced Heart Failure Team Pager [phone] (M-F; 7a - 5p)  Please contact [facility] for night-coverage after hours (5p -7a ) and weekends on [email or web address]

## 2022-02-08 ENCOUNTER — Ambulatory Visit: Payer: Medicaid Other | Admitting: Occupational Therapy

## 2022-02-08 DIAGNOSIS — J9621 Acute and chronic respiratory failure with hypoxia: Secondary | ICD-10-CM | POA: Diagnosis not present

## 2022-02-08 DIAGNOSIS — T80211D Bloodstream infection due to central venous catheter, subsequent encounter: Secondary | ICD-10-CM | POA: Diagnosis not present

## 2022-02-08 DIAGNOSIS — I270 Primary pulmonary hypertension: Secondary | ICD-10-CM

## 2022-02-08 DIAGNOSIS — I2609 Other pulmonary embolism with acute cor pulmonale: Secondary | ICD-10-CM | POA: Diagnosis not present

## 2022-02-08 DIAGNOSIS — I272 Pulmonary hypertension, unspecified: Secondary | ICD-10-CM | POA: Diagnosis not present

## 2022-02-08 DIAGNOSIS — R7881 Bacteremia: Secondary | ICD-10-CM | POA: Diagnosis not present

## 2022-02-08 DIAGNOSIS — J9601 Acute respiratory failure with hypoxia: Secondary | ICD-10-CM | POA: Diagnosis not present

## 2022-02-08 DIAGNOSIS — J9622 Acute and chronic respiratory failure with hypercapnia: Secondary | ICD-10-CM | POA: Diagnosis not present

## 2022-02-08 DIAGNOSIS — B9561 Methicillin susceptible Staphylococcus aureus infection as the cause of diseases classified elsewhere: Secondary | ICD-10-CM | POA: Diagnosis not present

## 2022-02-08 DIAGNOSIS — Z7189 Other specified counseling: Secondary | ICD-10-CM

## 2022-02-08 LAB — HEPATIC FUNCTION PANEL
ALT: 15 U/L (ref 0–44)
AST: 30 U/L (ref 15–41)
Albumin: 2.2 g/dL — ABNORMAL LOW (ref 3.5–5.0)
Alkaline Phosphatase: 71 U/L (ref 38–126)
Bilirubin, Direct: 0.2 mg/dL (ref 0.0–0.2)
Indirect Bilirubin: 0.7 mg/dL (ref 0.3–0.9)
Total Bilirubin: 0.9 mg/dL (ref 0.3–1.2)
Total Protein: 6.2 g/dL — ABNORMAL LOW (ref 6.5–8.1)

## 2022-02-08 LAB — CBC
HCT: 37.5 % (ref 36.0–46.0)
Hemoglobin: 12.6 g/dL (ref 12.0–15.0)
MCH: 29.1 pg (ref 26.0–34.0)
MCHC: 33.6 g/dL (ref 30.0–36.0)
MCV: 86.6 fL (ref 80.0–100.0)
Platelets: 88 10*3/uL — ABNORMAL LOW (ref 150–400)
RBC: 4.33 MIL/uL (ref 3.87–5.11)
RDW: 17.2 % — ABNORMAL HIGH (ref 11.5–15.5)
WBC: 19.7 10*3/uL — ABNORMAL HIGH (ref 4.0–10.5)
nRBC: 0 % (ref 0.0–0.2)

## 2022-02-08 LAB — BASIC METABOLIC PANEL
Anion gap: 11 (ref 5–15)
BUN: 29 mg/dL — ABNORMAL HIGH (ref 6–20)
CO2: 33 mmol/L — ABNORMAL HIGH (ref 22–32)
Calcium: 8.7 mg/dL — ABNORMAL LOW (ref 8.9–10.3)
Chloride: 89 mmol/L — ABNORMAL LOW (ref 98–111)
Creatinine, Ser: 0.7 mg/dL (ref 0.44–1.00)
GFR, Estimated: >60 mL/min (ref >60)
Glucose, Bld: 116 mg/dL — ABNORMAL HIGH (ref 70–99)
Potassium: 3.8 mmol/L (ref 3.5–5.1)
Sodium: 133 mmol/L — ABNORMAL LOW (ref 135–145)

## 2022-02-08 LAB — DIC (DISSEMINATED INTRAVASCULAR COAGULATION)PANEL
D-Dimer, Quant: 0.5 ug/mL-FEU (ref 0.00–0.50)
Fibrinogen: 791 mg/dL — ABNORMAL HIGH (ref 210–475)
INR: 1.1 (ref 0.8–1.2)
Platelets: 85 10*3/uL — ABNORMAL LOW (ref 150–400)
Prothrombin Time: 13.7 seconds (ref 11.4–15.2)
Smear Review: NONE SEEN
aPTT: 105 seconds — ABNORMAL HIGH (ref 24–36)

## 2022-02-08 LAB — GLUCOSE, CAPILLARY
Glucose-Capillary: 115 mg/dL — ABNORMAL HIGH (ref 70–99)
Glucose-Capillary: 114 mg/dL — ABNORMAL HIGH (ref 70–99)
Glucose-Capillary: 189 mg/dL — ABNORMAL HIGH (ref 70–99)

## 2022-02-08 LAB — HEPARIN LEVEL (UNFRACTIONATED): Heparin Unfractionated: 0.4 IU/mL (ref 0.30–0.70)

## 2022-02-08 MED ORDER — GLYCOPYRROLATE 0.2 MG/ML IJ SOLN: 0.2000 mg | INTRAMUSCULAR | Status: DC | PRN

## 2022-02-08 MED ORDER — MORPHINE SULFATE (PF) 2 MG/ML IV SOLN
2.0000 mg | INTRAVENOUS | Status: DC | PRN
  Administered 2022-02-08 (×2): 4 mg via INTRAVENOUS
  Administered 2022-02-09: 2 mg via INTRAVENOUS
  Administered 2022-02-10 – 2022-02-11 (×6): 4 mg via INTRAVENOUS
  Filled 2022-02-08 (×3): qty 2
  Filled 2022-02-08: qty 1
  Filled 2022-02-08 (×5): qty 2

## 2022-02-08 MED ORDER — POLYVINYL ALCOHOL 1.4 % OP SOLN: 1.0000 [drp] | Freq: Four times a day (QID) | OPHTHALMIC | Status: DC | PRN

## 2022-02-08 MED ORDER — SODIUM CHLORIDE 0.9 % IV SOLN: INTRAVENOUS | Status: DC

## 2022-02-08 MED ORDER — GLYCOPYRROLATE 1 MG PO TABS: 1.0000 mg | ORAL_TABLET | ORAL | Status: DC | PRN

## 2022-02-08 MED ORDER — ACETAMINOPHEN 650 MG RE SUPP: 650.0000 mg | Freq: Four times a day (QID) | RECTAL | Status: DC | PRN

## 2022-02-08 MED ORDER — ACETAMINOPHEN 325 MG PO TABS: 650.0000 mg | ORAL_TABLET | Freq: Four times a day (QID) | ORAL | Status: DC | PRN

## 2022-02-08 NOTE — Progress Notes (Signed)
NAME:  [name], MRN:  [id number], DOB:  [birth year], LOS: 8 ADMISSION DATE:  01/25/2022, CONSULTATION DATE:  01/21/2022 REFERRING MD:  Regenia Skeeter - EDP CHIEF COMPLAINT:  Acute hypoxic resp failure; CHF vs. COPD exacerbation   History of Present Illness:  62 year old woman who presented to [facility] 1/28 for respiratory failure. PMHx significant for chronic diastolic HF, cor pulmonale, severe pulmonary hypertension (on Remodulin and sildenafil), A-fib on Xarelto, OSA on CPAP presents to [facility] ED on 5/28 with SOB.  Patient states her breathing has worsened over the past few days.  States she is not very compliant with her medications at home.  States her breathing has gotten a lot more swollen over the past few days making it harder on her breathing.  On 1/28 her breathing was much worse and called EMS.  EMS found patient with sats 72% on room air and initially placed on O2 but required CPAP.  Upon arrival to [facility] ED on 1/28, patient in acute respiratory distress on BiPAP.  Initial BP 99/50.  RR 30s.  Afebrile but wbc 25.4 EKG with no signs of ischemia.  Given 80 IV Lasix. CXR w/ R basilar opacity. Given rocephin/azithro for possible cap.  Cultures obtained.  HF notified by ED physician for consult. PCCM consulted for ICU admission.  Pertinent ED labs: COVID/flu/RSV negative, NA 128, potassium 2.5, mag 1.8, creat 1.03, BNP 449,Troponin 371 troponin 371 then 373, VBG 7.46, 39, 50, 28.  Pertinent Medical History:   Past Medical History:  Diagnosis Date   Acute on chronic diastolic (congestive) heart failure ([place]) 06/25/2013   COPD (chronic obstructive pulmonary disease) (HCC)    Cor pulmonale (HCC)    PA Peak pressure 38mHg   Depression    Diabetes mellitus    well controlled on metformin   External hemorrhoid 01/11/2020   GERD (gastroesophageal reflux disease)    H/O mental retardation    Hyperlipidemia    Hypertension    OSA (obstructive sleep apnea)    CPAP   Pulmonary hypertension (HCC)     Restrictive lung disease    PFTs 06/2012 (FVC 54% predicted and FEV1 68% predicted w minimal bronchodilator response).   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    chornic, ?followed up at wound care center, multiple courses of antibiotics in past for cellulitis, on lasix   Significant Hospital Events: Including procedures, antibiotic start and stop dates in addition to other pertinent events   1/28 Admitted likely CHF exacerbation on bipap 1/29 Remains on BiPAP, trialed HFNC but desaturated 1/30 Off of BiPAP in AM, tolerated HHFNC all day, fatigued in afternoon with desats 1/31 Off of BiPAP, less reserve today; sats ~85%. CXR with worsening bibasilar airspace disease,  L > R. Abx broadened to cefepime; aggressive pulm toilet/diuresis per AHF team. Net -2.3L. 2/01 Remains on OptiFlow, unable to wean. Ongoing aggressive diuresis. Tunneled line removed at bedside by IR. 2/02 Stable O2 requirement, no significant changes. Remodulin to PIV. 2/3 nearly obtunded, GOC talk then she perked up again 2/5 transition to comfort  Interim History / Subjective:  Patient awake Unable to come off bipap due to wob and desaturation Complaining of some nausea and coughing this am Off levo   Objective:  Blood pressure (!) 107/59, pulse (!) 110, temperature 98.5 F (36.9 C), temperature source Oral, resp. rate (!) 36, height '5\' 3"'$  (1.6 m), weight 114.3  kg, last menstrual period 02/19/2011, SpO2 (!) 86 %.    FiO2 (%):  [100 %] 100 %   Intake/Output Summary (Last 24 hours) at 02/08/2022 1041 Last data filed at 02/08/2022 0900 Gross per 24 hour  Intake 1021.24 ml  Output 4338 ml  Net -3316.76 ml    Filed Weights   02/04/22 0500 02/05/22 0500 02/06/22 0500  Weight: 115.4 kg 112 kg 114.3 kg   Physical Examination: General:  critically ill appearing on bipap HEENT: MM pink/moist; bipap in  place Neuro: AO; MAE CV: s1s2, tachy 110s, no m/r/g PULM:  dim clear BS bilaterally; on bipap; rr 20s GI: soft, bsx4 active  Extremities: warm/dry, ble unna boots in place Skin: no rashes or lesions    Assessment & Plan:  Acute hypoxemic resp failure- frail baseline with superimposed decompensated heart failure, possible HCAP Severe pulmonary HTN groups 1/2/3 Decompensated cor pulmonale- unfortunately not really improving PICC line associated MSSA bacteremia- PICC removed 2/1 Afib on AC PTA Thrombocytopenia- timing and trajectory not as c/w HIT, question septic response vs sequestration of end stage heart failure P: -see ipal note; transitioning to comfort today -prn morphine for now -hold labs and cbg monitoring -continue HHFNC for now and bipap qhs/prn  Best Practice: (right click and "Reselect all SmartList Selections" daily)   Diet/type: reg DVT prophylaxis: systemic heparin GI prophylaxis: PPI Lines: N/A Foley:  N/A Code Status: DNR Last date of multidisciplinary goals of care discussion [25 see separate IPAL note; transition to comfort today]  Critical care time: 35 minutes   JD Rexene Agent Yolanda Dockendorf [facility] 02/08/2022, 11:21 AM  Please see [email or web address] for pager details.  From 7A-7P if no response, please call [phone]. After hours, please call [facility] [phone].

## 2022-02-08 NOTE — Progress Notes (Signed)
Orthopedic Tech Progress Note Patient Details:  JESLY HARTMANN [birth year] [id number]  Ortho Devices Type of Ortho Device: Ace wrap, Unna boot Ortho Device/Splint Location: BLE Ortho Device/Splint Interventions: Ordered, Application, Adjustment   Post Interventions Patient Tolerated: Fair, Well Instructions Provided: Care of device  Janit Pagan 02/08/2022, 11:28 AM

## 2022-02-08 NOTE — Progress Notes (Signed)
Subjective:   Patient has been desatting significantly when she eats   Antibiotics:  Anti-infectives (From admission, onward)    Start     Dose/Rate Route Frequency Ordered Stop   02/10/22 0600  ceFAZolin (ANCEF) IVPB 2g/100 mL premix       See Hyperspace for full Linked Orders Report.   2 g 200 mL/hr over 30 Minutes Intravenous Every 8 hours 02/05/22 1616     02/05/22 1700  ceFEPIme (MAXIPIME) 2 g in sodium chloride 0.9 % 100 mL IVPB       See Hyperspace for full Linked Orders Report.   2 g 200 mL/hr over 30 Minutes Intravenous Every 8 hours 02/05/22 1616 02/10/22 0114   02/03/22 1445  ceFEPIme (MAXIPIME) 2 g in sodium chloride 0.9 % 100 mL IVPB  Status:  Discontinued        2 g 200 mL/hr over 30 Minutes Intravenous Every 8 hours 02/03/22 1351 02/05/22 1616   02/01/22 2000  azithromycin (ZITHROMAX) 500 mg in sodium chloride 0.9 % 250 mL IVPB  Status:  Discontinued        500 mg 250 mL/hr over 60 Minutes Intravenous Every 24 hours 02/01/22 0033 02/01/22 1144   02/01/22 2000  cefTRIAXone (ROCEPHIN) 2 g in sodium chloride 0.9 % 100 mL IVPB  Status:  Discontinued        2 g 200 mL/hr over 30 Minutes Intravenous Every 24 hours 02/01/22 0033 02/01/22 1144   02/01/22 1200  ceFAZolin (ANCEF) IVPB 2g/100 mL premix  Status:  Discontinued        2 g 200 mL/hr over 30 Minutes Intravenous Every 8 hours 02/01/22 1144 02/03/22 1351   01/05/2022 2015  cefTRIAXone (ROCEPHIN) 1 g in sodium chloride 0.9 % 100 mL IVPB        1 g 200 mL/hr over 30 Minutes Intravenous  Once 01/12/2022 2000 01/28/2022 2030   01/04/2022 2015  azithromycin (ZITHROMAX) 500 mg in sodium chloride 0.9 % 250 mL IVPB        500 mg 250 mL/hr over 60 Minutes Intravenous  Once 01/16/2022 2000 01/27/2022 2128       Medications: Scheduled Meds:  Chlorhexidine Gluconate Cloth  6 each Topical Daily   furosemide  80 mg Intravenous BID   gabapentin  600 mg Oral TID   insulin aspart  0-9 Units Subcutaneous Q4H   liver  oil-zinc oxide   Topical BID   midodrine  5 mg Oral TID WC   mouth rinse  15 mL Mouth Rinse 4 times per day   pantoprazole  20 mg Oral BID   potassium chloride  40 mEq Oral BID   pravastatin  20 mg Oral Daily   sildenafil  20 mg Oral TID   sodium chloride flush  10-40 mL Intracatheter Q12H   trimethoprim-polymyxin b  1 drop Both Eyes Q6H   Continuous Infusions:  sodium chloride Stopped (02/01/22 1154)   sodium chloride 10 mL/hr at 02/08/22 1200   sodium chloride     ceFEPime (MAXIPIME) IV Stopped (02/08/22 0830)   Followed by   Derrill Memo ON 02/10/2022]  ceFAZolin (ANCEF) IV     heparin 2,350 Units/hr (02/08/22 1200)   norepinephrine (LEVOPHED) Adult infusion Stopped (02/07/22 0959)   treprostinil (REMODULIN) 35,000,000 ng in pH 12 sterile diluent 100 mL (350,000 ng/mL) infusion 81 ng/kg/min (02/08/22 1000)   PRN Meds:.acetaminophen **OR** acetaminophen, acetaminophen, docusate sodium, Gerhardt's butt cream, glycopyrrolate **OR** glycopyrrolate **OR** glycopyrrolate, ipratropium-albuterol, loperamide, morphine  injection, ondansetron (ZOFRAN) IV, mouth rinse, phenol, polyethylene glycol, polyvinyl alcohol, sodium chloride flush    Objective: Weight change:   Intake/Output Summary (Last 24 hours) at 02/08/2022 1252 Last data filed at 02/08/2022 1200 Gross per 24 hour  Intake 1185.59 ml  Output 4038 ml  Net -2852.41 ml   Blood pressure (!) 109/58, pulse (!) 110, temperature 98.3 F (36.8 C), temperature source Axillary, resp. rate (!) 25, height '5\' 3"'$  (1.6 m), weight 114.3 kg, last menstrual period 02/19/2011, SpO2 (!) 84 %. Temp:  [97.8 F (36.6 C)-98.5 F (36.9 C)] 98.3 F (36.8 C) (02/05 1130) Pulse Rate:  [75-115] 110 (02/05 1200) Resp:  [19-39] 25 (02/05 1200) BP: (95-123)/(51-81) 109/58 (02/05 1200) SpO2:  [78 %-94 %] 84 % (02/05 1200) FiO2 (%):  [100 %] 100 % (02/05 1023)  Physical Exam: Physical Exam Constitutional:      Appearance: She is obese.  Eyes:     Extraocular  Movements: Extraocular movements intact.     Pupils: Pupils are equal, round, and reactive to light.  Pulmonary:     Effort: Tachypnea present.     Breath sounds: Rhonchi present.  Abdominal:     General: Bowel sounds are normal. There is distension.  Neurological:     General: No focal deficit present.     Mental Status: She is alert and oriented to person, place, and time.  Psychiatric:        Attention and Perception: Attention normal.        Mood and Affect: Mood is anxious.        Thought Content: Thought content normal.        Judgment: Judgment normal.      CBC:    BMET Recent Labs    02/07/22 0655 02/08/22 0413  NA 132* 133*  K 3.5 3.8  CL 90* 89*  CO2 34* 33*  GLUCOSE 109* 116*  BUN 34* 29*  CREATININE 0.72 0.70  CALCIUM 8.2* 8.7*     Liver Panel  Recent Labs    02/08/22 0413  PROT 6.2*  ALBUMIN 2.2*  AST 30  ALT 15  ALKPHOS 71  BILITOT 0.9  BILIDIR 0.2  IBILI 0.7       Sedimentation Rate No results for input(s): "ESRSEDRATE" in the last 72 hours. C-Reactive Protein No results for input(s): "CRP" in the last 72 hours.  Micro Results: Recent Results (from the past 720 hour(s))  Urine Culture (for pregnant, neutropenic or urologic patients or patients with an indwelling urinary catheter)     Status: Abnormal   Collection Time: 01/22/2022 12:57 AM   Specimen: Urine, Clean Catch  Result Value Ref Range Status   Specimen Description URINE, CLEAN CATCH  Final   Special Requests   Final    NONE Performed at [facility], [address], [place], [place] [address]    Culture MULTIPLE SPECIES PRESENT, SUGGEST RECOLLECTION (A)  Final   Report Status 02/02/2022 FINAL  Final  Resp panel by RT-PCR (RSV, Flu A&B, Covid) Anterior Nasal Swab     Status: None   Collection Time: 01/14/2022  7:33 PM   Specimen: Anterior Nasal Swab  Result Value Ref Range Status   SARS Coronavirus 2 by RT PCR NEGATIVE NEGATIVE Final    Comment: (NOTE) SARS-CoV-2  target nucleic acids are NOT DETECTED.  The SARS-CoV-2 RNA is generally detectable in upper respiratory specimens during the acute phase of infection. The lowest concentration of SARS-CoV-2 viral copies this assay can detect  is 138 copies/mL. A negative result does not preclude SARS-Cov-2 infection and should not be used as the sole basis for treatment or other patient management decisions. A negative result may occur with  improper specimen collection/handling, submission of specimen other than nasopharyngeal swab, presence of viral mutation(s) within the areas targeted by this assay, and inadequate number of viral copies(<138 copies/mL). A negative result must be combined with clinical observations, patient history, and epidemiological information. The expected result is Negative.  Fact Sheet for Patients:  EntrepreneurPulse.com.au  Fact Sheet for Healthcare Providers:  IncredibleEmployment.be  This test is no t yet approved or cleared by the [place] FDA and  has been authorized for detection and/or diagnosis of SARS-CoV-2 by FDA under an Emergency Use Authorization (EUA). This EUA will remain  in effect (meaning this test can be used) for the duration of the COVID-19 declaration under Section 564(b)(1) of the Act, 21 U.S.C.section 360bbb-3(b)(1), unless the authorization is terminated  or revoked sooner.       Influenza A by PCR NEGATIVE NEGATIVE Final   Influenza B by PCR NEGATIVE NEGATIVE Final    Comment: (NOTE) The Xpert Xpress SARS-CoV-2/FLU/RSV plus assay is intended as an aid in the diagnosis of influenza from Nasopharyngeal swab specimens and should not be used as a sole basis for treatment. Nasal washings and aspirates are unacceptable for Xpert Xpress SARS-CoV-2/FLU/RSV testing.  Fact Sheet for Patients: EntrepreneurPulse.com.au  Fact Sheet for Healthcare  Providers: IncredibleEmployment.be  This test is not yet approved or cleared by the [place] FDA and has been authorized for detection and/or diagnosis of SARS-CoV-2 by FDA under an Emergency Use Authorization (EUA). This EUA will remain in effect (meaning this test can be used) for the duration of the COVID-19 declaration under Section 564(b)(1) of the Act, 21 U.S.C. section 360bbb-3(b)(1), unless the authorization is terminated or revoked.     Resp Syncytial Virus by PCR NEGATIVE NEGATIVE Final    Comment: (NOTE) Fact Sheet for Patients: EntrepreneurPulse.com.au  Fact Sheet for Healthcare Providers: IncredibleEmployment.be  This test is not yet approved or cleared by the [place] FDA and has been authorized for detection and/or diagnosis of SARS-CoV-2 by FDA under an Emergency Use Authorization (EUA). This EUA will remain in effect (meaning this test can be used) for the duration of the COVID-19 declaration under Section 564(b)(1) of the Act, 21 U.S.C. section 360bbb-3(b)(1), unless the authorization is terminated or revoked.  Performed at [facility], [place] [address], [place], [place] [address]   Blood culture (routine x 2)     Status: Abnormal   Collection Time: 01/18/2022  7:40 PM   Specimen: BLOOD  Result Value Ref Range Status   Specimen Description BLOOD RIGHT ANTECUBITAL  Final   Special Requests   Final    BOTTLES DRAWN AEROBIC AND ANAEROBIC Blood Culture adequate volume   Culture  Setup Time   Final    GRAM POSITIVE COCCI IN CLUSTERS IN BOTH AEROBIC AND ANAEROBIC BOTTLES CRITICAL RESULT CALLED TO, READ BACK BY AND VERIFIED WITH: PHARMD E.MARTIN AT 2778 02/01/2022 BY T.SAAD. Performed at [facility], [place] [address] [place], [place] [address]    Culture STAPHYLOCOCCUS AUREUS (A)  Final   Report Status 02/03/2022 FINAL  Final   Organism ID, Bacteria STAPHYLOCOCCUS AUREUS  Final       Susceptibility   Staphylococcus aureus - MIC*    CIPROFLOXACIN <=0.5 SENSITIVE Sensitive     ERYTHROMYCIN >=8 RESISTANT Resistant  GENTAMICIN <=0.5 SENSITIVE Sensitive     OXACILLIN <=0.25 SENSITIVE Sensitive     TETRACYCLINE <=1 SENSITIVE Sensitive     VANCOMYCIN 1 SENSITIVE Sensitive     TRIMETH/SULFA <=10 SENSITIVE Sensitive     CLINDAMYCIN RESISTANT Resistant     RIFAMPIN <=0.5 SENSITIVE Sensitive     Inducible Clindamycin POSITIVE Resistant     * STAPHYLOCOCCUS AUREUS  Blood Culture ID Panel (Reflexed)     Status: Abnormal   Collection Time: 02/02/2022  7:40 PM  Result Value Ref Range Status   Enterococcus faecalis NOT DETECTED NOT DETECTED Final   Enterococcus Faecium NOT DETECTED NOT DETECTED Final   Listeria monocytogenes NOT DETECTED NOT DETECTED Final   Staphylococcus species DETECTED (A) NOT DETECTED Final    Comment: CRITICAL RESULT CALLED TO, READ BACK BY AND VERIFIED WITH: PHARMD E.MARTIN AT 1130 02/01/2022 BY T.SAAD.    Staphylococcus aureus (BCID) DETECTED (A) NOT DETECTED Final    Comment: CRITICAL RESULT CALLED TO, READ BACK BY AND VERIFIED WITH: PHARMD E.MARTIN AT 1130 02/01/2022 BY T.SAAD.    Staphylococcus epidermidis NOT DETECTED NOT DETECTED Final   Staphylococcus lugdunensis NOT DETECTED NOT DETECTED Final   Streptococcus species NOT DETECTED NOT DETECTED Final   Streptococcus agalactiae NOT DETECTED NOT DETECTED Final   Streptococcus pneumoniae NOT DETECTED NOT DETECTED Final   Streptococcus pyogenes NOT DETECTED NOT DETECTED Final   A.calcoaceticus-baumannii NOT DETECTED NOT DETECTED Final   Bacteroides fragilis NOT DETECTED NOT DETECTED Final   Enterobacterales NOT DETECTED NOT DETECTED Final   Enterobacter cloacae complex NOT DETECTED NOT DETECTED Final   Escherichia coli NOT DETECTED NOT DETECTED Final   Klebsiella aerogenes NOT DETECTED NOT DETECTED Final   Klebsiella oxytoca NOT DETECTED NOT DETECTED Final   Klebsiella pneumoniae NOT DETECTED  NOT DETECTED Final   Proteus species NOT DETECTED NOT DETECTED Final   Salmonella species NOT DETECTED NOT DETECTED Final   Serratia marcescens NOT DETECTED NOT DETECTED Final   Haemophilus influenzae NOT DETECTED NOT DETECTED Final   Neisseria meningitidis NOT DETECTED NOT DETECTED Final   Pseudomonas aeruginosa NOT DETECTED NOT DETECTED Final   Stenotrophomonas maltophilia NOT DETECTED NOT DETECTED Final   Candida albicans NOT DETECTED NOT DETECTED Final   Candida auris NOT DETECTED NOT DETECTED Final   Candida glabrata NOT DETECTED NOT DETECTED Final   Candida krusei NOT DETECTED NOT DETECTED Final   Candida parapsilosis NOT DETECTED NOT DETECTED Final   Candida tropicalis NOT DETECTED NOT DETECTED Final   Cryptococcus neoformans/gattii NOT DETECTED NOT DETECTED Final   Meth resistant mecA/C and MREJ NOT DETECTED NOT DETECTED Final    Comment: Performed at [facility], [address] [place], [place] [address]  Blood culture (routine x 2)     Status: None   Collection Time: 01/19/2022  8:41 PM   Specimen: BLOOD RIGHT HAND  Result Value Ref Range Status   Specimen Description BLOOD RIGHT HAND  Final   Special Requests   Final    BOTTLES DRAWN AEROBIC AND ANAEROBIC Blood Culture adequate volume   Culture   Final    NO GROWTH 5 DAYS Performed at [facility], [place] [address] [place], [place] [address]    Report Status 02/05/2022 FINAL  Final  MRSA Next Gen by PCR, Nasal     Status: None   Collection Time: 02/01/22 12:38 AM   Specimen: Nasal Mucosa; Nasal Swab  Result Value Ref Range Status   MRSA by PCR Next Gen NOT DETECTED  NOT DETECTED Final    Comment: (NOTE) The GeneXpert MRSA Assay (FDA approved for NASAL specimens only), is one component of a comprehensive MRSA colonization surveillance program. It is not intended to diagnose MRSA infection nor to guide or monitor treatment for MRSA infections. Test performance is not FDA approved in patients less than 67  years old. Performed at [facility], [place] [address] [place], [place] [address]   Culture, blood (Routine X 2) w Reflex to ID Panel     Status: Abnormal   Collection Time: 02/01/22  4:53 PM   Specimen: BLOOD  Result Value Ref Range Status   Specimen Description BLOOD SITE NOT SPECIFIED  Final   Special Requests   Final    BOTTLES DRAWN AEROBIC AND ANAEROBIC Blood Culture adequate volume   Culture  Setup Time   Final    GRAM POSITIVE COCCI IN CLUSTERS IN PEDIATRIC BOTTLE CRITICAL RESULT CALLED TO, READ BACK BY AND VERIFIED WITH: PHARMD CAREN AMEND ON 02/02/22 @ 2125 BY DRT    Culture (A)  Final    STAPHYLOCOCCUS AUREUS SUSCEPTIBILITIES PERFORMED ON PREVIOUS CULTURE WITHIN THE LAST 5 DAYS. Performed at [facility], [place] [address], [place], [place] [address]    Report Status 02/04/2022 FINAL  Final  Blood Culture ID Panel (Reflexed)     Status: Abnormal   Collection Time: 02/01/22  4:53 PM  Result Value Ref Range Status   Enterococcus faecalis NOT DETECTED NOT DETECTED Final   Enterococcus Faecium NOT DETECTED NOT DETECTED Final   Listeria monocytogenes NOT DETECTED NOT DETECTED Final   Staphylococcus species DETECTED (A) NOT DETECTED Final    Comment: CRITICAL RESULT CALLED TO, READ BACK BY AND VERIFIED WITH: PHARMD CAREN AMEND ON 02/02/22 @ 2125 BY DRT    Staphylococcus aureus (BCID) DETECTED (A) NOT DETECTED Final    Comment: CRITICAL RESULT CALLED TO, READ BACK BY AND VERIFIED WITH: PHARMD CAREN AMEND ON 02/02/22 @ 2125 BY DRT    Staphylococcus epidermidis NOT DETECTED NOT DETECTED Final   Staphylococcus lugdunensis NOT DETECTED NOT DETECTED Final   Streptococcus species NOT DETECTED NOT DETECTED Final   Streptococcus agalactiae NOT DETECTED NOT DETECTED Final   Streptococcus pneumoniae NOT DETECTED NOT DETECTED Final   Streptococcus pyogenes NOT DETECTED NOT DETECTED Final   A.calcoaceticus-baumannii NOT DETECTED NOT DETECTED Final   Bacteroides fragilis NOT  DETECTED NOT DETECTED Final   Enterobacterales NOT DETECTED NOT DETECTED Final   Enterobacter cloacae complex NOT DETECTED NOT DETECTED Final   Escherichia coli NOT DETECTED NOT DETECTED Final   Klebsiella aerogenes NOT DETECTED NOT DETECTED Final   Klebsiella oxytoca NOT DETECTED NOT DETECTED Final   Klebsiella pneumoniae NOT DETECTED NOT DETECTED Final   Proteus species NOT DETECTED NOT DETECTED Final   Salmonella species NOT DETECTED NOT DETECTED Final   Serratia marcescens NOT DETECTED NOT DETECTED Final   Haemophilus influenzae NOT DETECTED NOT DETECTED Final   Neisseria meningitidis NOT DETECTED NOT DETECTED Final   Pseudomonas aeruginosa NOT DETECTED NOT DETECTED Final   Stenotrophomonas maltophilia NOT DETECTED NOT DETECTED Final   Candida albicans NOT DETECTED NOT DETECTED Final   Candida auris NOT DETECTED NOT DETECTED Final   Candida glabrata NOT DETECTED NOT DETECTED Final   Candida krusei NOT DETECTED NOT DETECTED Final   Candida parapsilosis NOT DETECTED NOT DETECTED Final   Candida tropicalis NOT DETECTED NOT DETECTED Final   Cryptococcus neoformans/gattii NOT DETECTED NOT DETECTED Final   Meth resistant mecA/C and MREJ NOT  DETECTED NOT DETECTED Final    Comment: Performed at [facility], [place] [address], [place], [place] [address]  Culture, blood (Routine X 2) w Reflex to ID Panel     Status: None (Preliminary result)   Collection Time: 02/04/22  2:17 PM   Specimen: BLOOD RIGHT HAND  Result Value Ref Range Status   Specimen Description BLOOD RIGHT HAND  Final   Special Requests   Final    BOTTLES DRAWN AEROBIC AND ANAEROBIC Blood Culture adequate volume   Culture   Final    NO GROWTH 4 DAYS Performed at [facility], [place] [address] [place], [place] [address]    Report Status PENDING  Incomplete  Culture, blood (Routine X 2) w Reflex to ID Panel     Status: Abnormal   Collection Time: 02/04/22  2:17 PM   Specimen: BLOOD RIGHT HAND  Result Value Ref  Range Status   Specimen Description BLOOD RIGHT HAND  Final   Special Requests IN PEDIATRIC BOTTLE Blood Culture adequate volume  Final   Culture  Setup Time   Final    GRAM POSITIVE COCCI IN CLUSTERS IN PEDIATRIC BOTTLE CRITICAL RESULT CALLED TO, READ BACK BY AND VERIFIED WITH: PHARMD AUSTIN PAYTES [id number] AT 71 BY EC Performed at [facility], [place] [address] [place], [place] [address]    Culture STAPHYLOCOCCUS AUREUS (A)  Final   Report Status 02/07/2022 FINAL  Final   Organism ID, Bacteria STAPHYLOCOCCUS AUREUS  Final      Susceptibility   Staphylococcus aureus - MIC*    CIPROFLOXACIN <=0.5 SENSITIVE Sensitive     ERYTHROMYCIN >=8 RESISTANT Resistant     GENTAMICIN <=0.5 SENSITIVE Sensitive     OXACILLIN 0.5 SENSITIVE Sensitive     TETRACYCLINE <=1 SENSITIVE Sensitive     VANCOMYCIN 1 SENSITIVE Sensitive     TRIMETH/SULFA <=10 SENSITIVE Sensitive     CLINDAMYCIN RESISTANT Resistant     RIFAMPIN <=0.5 SENSITIVE Sensitive     Inducible Clindamycin POSITIVE Resistant     * STAPHYLOCOCCUS AUREUS  Blood Culture ID Panel (Reflexed)     Status: Abnormal   Collection Time: 02/04/22  2:17 PM  Result Value Ref Range Status   Enterococcus faecalis NOT DETECTED NOT DETECTED Final   Enterococcus Faecium NOT DETECTED NOT DETECTED Final   Listeria monocytogenes NOT DETECTED NOT DETECTED Final   Staphylococcus species DETECTED (A) NOT DETECTED Final    Comment: CRITICAL RESULT CALLED TO, READ BACK BY AND VERIFIED WITH: PHARMD AUSTIN PAYTES [id number] AT 1355 BY EC    Staphylococcus aureus (BCID) DETECTED (A) NOT DETECTED Final    Comment: CRITICAL RESULT CALLED TO, READ BACK BY AND VERIFIED WITH: PHARMD AUSTIN PAYTES [id number] AT 8270 BY EC    Staphylococcus epidermidis NOT DETECTED NOT DETECTED Final   Staphylococcus lugdunensis NOT DETECTED NOT DETECTED Final   Streptococcus species NOT DETECTED NOT DETECTED Final   Streptococcus agalactiae NOT DETECTED NOT DETECTED Final    Streptococcus pneumoniae NOT DETECTED NOT DETECTED Final   Streptococcus pyogenes NOT DETECTED NOT DETECTED Final   A.calcoaceticus-baumannii NOT DETECTED NOT DETECTED Final   Bacteroides fragilis NOT DETECTED NOT DETECTED Final   Enterobacterales NOT DETECTED NOT DETECTED Final   Enterobacter cloacae complex NOT DETECTED NOT DETECTED Final   Escherichia coli NOT DETECTED NOT DETECTED Final   Klebsiella aerogenes NOT DETECTED NOT DETECTED Final   Klebsiella oxytoca NOT DETECTED NOT DETECTED Final   Klebsiella pneumoniae NOT DETECTED NOT DETECTED Final  Proteus species NOT DETECTED NOT DETECTED Final   Salmonella species NOT DETECTED NOT DETECTED Final   Serratia marcescens NOT DETECTED NOT DETECTED Final   Haemophilus influenzae NOT DETECTED NOT DETECTED Final   Neisseria meningitidis NOT DETECTED NOT DETECTED Final   Pseudomonas aeruginosa NOT DETECTED NOT DETECTED Final   Stenotrophomonas maltophilia NOT DETECTED NOT DETECTED Final   Candida albicans NOT DETECTED NOT DETECTED Final   Candida auris NOT DETECTED NOT DETECTED Final   Candida glabrata NOT DETECTED NOT DETECTED Final   Candida krusei NOT DETECTED NOT DETECTED Final   Candida parapsilosis NOT DETECTED NOT DETECTED Final   Candida tropicalis NOT DETECTED NOT DETECTED Final   Cryptococcus neoformans/gattii NOT DETECTED NOT DETECTED Final   Meth resistant mecA/C and MREJ NOT DETECTED NOT DETECTED Final    Comment: Performed at [facility], [address], [place], [place] [address]  Culture, blood (Routine X 2) w Reflex to ID Panel     Status: None (Preliminary result)   Collection Time: 02/05/22  7:17 AM   Specimen: BLOOD LEFT HAND  Result Value Ref Range Status   Specimen Description BLOOD LEFT HAND  Final   Special Requests   Final    BOTTLES DRAWN AEROBIC ONLY Blood Culture adequate volume   Culture   Final    NO GROWTH 3 DAYS Performed at [facility], [place] [address] [place], [place] [address]     Report Status PENDING  Incomplete  Culture, blood (Routine X 2) w Reflex to ID Panel     Status: None (Preliminary result)   Collection Time: 02/05/22  7:17 AM   Specimen: BLOOD RIGHT HAND  Result Value Ref Range Status   Specimen Description BLOOD RIGHT HAND  Final   Special Requests   Final    BOTTLES DRAWN AEROBIC AND ANAEROBIC Blood Culture adequate volume   Culture   Final    NO GROWTH 3 DAYS Performed at [facility], [place] [address], [place], [place] [address]    Report Status PENDING  Incomplete  Culture, blood (Routine X 2) w Reflex to ID Panel     Status: None (Preliminary result)   Collection Time: 02/07/22 12:11 PM   Specimen: BLOOD RIGHT HAND  Result Value Ref Range Status   Specimen Description BLOOD RIGHT HAND  Final   Special Requests   Final    BOTTLES DRAWN AEROBIC AND ANAEROBIC Blood Culture adequate volume   Culture   Final    NO GROWTH < 24 HOURS Performed at [facility], [place] [address], [place], [place] [address]    Report Status PENDING  Incomplete  Culture, blood (Routine X 2) w Reflex to ID Panel     Status: None (Preliminary result)   Collection Time: 02/07/22 12:11 PM   Specimen: BLOOD  Result Value Ref Range Status   Specimen Description BLOOD THUMB  Final   Special Requests   Final    BOTTLES DRAWN AEROBIC AND ANAEROBIC Blood Culture adequate volume   Culture   Final    NO GROWTH < 24 HOURS Performed at [facility], [place] [address] [place], [place] [address]    Report Status PENDING  Incomplete    Studies/Results: No results found.    Assessment/Plan:  INTERVAL HISTORY:   Blood cultures no growth at 4 days   Principal Problem:   MSSA bacteremia Active Problems:   Acute respiratory failure with hypoxia (HCC)   Hyponatremia  Hypokalemia   Community acquired pneumonia   Acute on chronic right-sided congestive heart failure ([facility])   Bloodstream infection due to central venous catheter   Pulmonary HTN ([facility])   HCAP  (healthcare-associated pneumonia)   Goals of care, counseling/discussion    Shalayna A Kolinski is a 62 y.o. female with MSSA bacteremia likely due to line for medications for her pulmonary HTN, also now in midst of being rx for HCAP  #1 Goals of care: patient shifting to comfort care measures during this hospital stay.  Would defer to primary team if they want to continue antibiotics in the interim   #2 IF still treating would complete 7 days of cefepime and switch back to cefazolin, she could also be DC on oral antiobitics but she does not seem likely to survive this admission  I spent 52 minutes with the patient including than 50% of the time in face to face and non face to faced time with  the patient personally reviewing along with review of medical records in preparation for the visit and during the visit and in coordination of her care.   I will sign off for now.  Please call with further questions.   LOS: 8 days   Alcide Evener 02/08/2022, 12:52 PM

## 2022-02-08 NOTE — Progress Notes (Signed)
[place] for Heparin (Xarelto on hold) Indication: atrial fibrillation  Allergies  Allergen Reactions   Aspirin Swelling    REACTION: airway swelling   Codeine Other (See Comments)    REACTION: tingling in lips and hard breathing - had reaction at dentist - states "I can't take certain kinds of codeine" - happened maybe 10 yr ago   Lisinopril Cough   Sulfonamide Derivatives Swelling    REACTION: airway swelling   Latex Rash   Patient Measurements: Height: '5\' 3"'$  (160 cm) Weight: 114.3 kg (251 lb 15.8 oz) IBW/kg (Calculated) : 52.4 Heparin dosing wt: 81 kg  Vital Signs: Temp: 98.5 F (36.9 C) (02/05 0742) Temp Source: Oral (02/05 0742) BP: 121/58 (02/05 0901) Pulse Rate: 107 (02/05 0901)  Labs: Recent Labs    02/06/22 0258 02/06/22 0424 02/07/22 0655 02/08/22 0413 02/08/22 0435  HGB 11.9*  --  12.0 12.6  --   HCT 35.4*  --  36.0 37.5  --   PLT 84*  --  74* 88* 85*  APTT  --   --   --   --  105*  LABPROT  --   --   --   --  13.7  INR  --   --   --   --  1.1  HEPARINUNFRC  --  0.38 0.44  --  0.40  CREATININE 1.06*  --  0.72 0.70  --      Estimated Creatinine Clearance: 91.1 mL/min (by C-G formula based on SCr of 0.7 mg/dL).   Assessment: 62 y/o F with HF and PAH presents to the ED with shortness breath. She is on Xarelto PTA for afib. Holding Xarelto and starting Heparin. The patient states she hasn't taken her Xarelto in over one week.   Heparin level is therapeutic at 0.4, CBC stable, HIT panel sent with ongoing thrombocytopenia.  Goal of Therapy:  Heparin level 0.3-0.7 units/ml Monitor platelets by anticoagulation protocol: Yes   Plan:  Continue heparin gtt at 2350 units/hr Monitor daily HL, CBC, and for s/sx of bleeding   Arrie Senate, PharmD, BCPS, [facility] Clinical Pharmacist [phone] Please check AMION for all [place] numbers 02/08/2022

## 2022-02-08 NOTE — IPAL (Signed)
  Interdisciplinary Goals of Care Family Meeting   Date carried out: 02/08/2022  Location of the meeting: Bedside  Member's involved: Bedside Registered Nurse, Family Member or next of kin, and Other: PA-C  Durable Power of Attorney or acting medical decision maker: son  Jordynn Marcella  Discussion: We discussed goals of care for [employer] .  Spoke with patient and son at bedside. Discussed poor prognosis given high O2 requirements and being on bipap continuously to support her breathing. Patient is aware that she is likely not to survive this hospital admission and is already DNR. Patient wants to be able to eat but unable given wob and desaturations coming off bipap. Both patient and son agree that they want patient to be comfortable her last days and would like to transition to comfort care. Will keep on HHFNC and bipap qhs/prn per patient comfort. Comfort orders placed.  Code status:   Code Status: DNR   Disposition: In-patient comfort care  Time spent for the meeting: 35 minutes    Mick Sell, PA-C  02/08/2022, 11:36 AM

## 2022-02-08 NOTE — Progress Notes (Addendum)
Advanced Heart Failure Rounding Note  PCP-Cardiologist: Dr. Haroldine Laws    Subjective:    On abx for suspected aspiration PNA and bacteremia. BCx from 1/28 + for Staph aureus. Echo (02/01/22) with EF 60-65%, D-shaped septum, moderate RV enlargement with moderately decreased systolic function, PASP 59, dilated IVC, no definite valvular vegetation.   1/31 Abx changed  to cefepime.  2/1 Diuresed IV lasix+metolazone+ diamox. Tunneled PICC removed.   2/4 Diuresed with IV lasix and started on midodrine. Norepi weaned off   On IV treprostinil via PIV + sildenafil for PH.   Blood Cx --->  MSSA  On bipap this morning. Denies pain.     Objective:   Weight Range: 114.3 kg Body mass index is 44.64 kg/m.   Vital Signs:   Temp:  [97.8 F (36.6 C)-98.3 F (36.8 C)] 98.3 F (36.8 C) (02/05 0300) Pulse Rate:  [61-300] 87 (02/05 0500) Resp:  [17-39] 25 (02/05 0500) BP: (95-135)/(47-118) 109/62 (02/05 0500) SpO2:  [84 %-100 %] 90 % (02/05 0500) FiO2 (%):  [85 %-100 %] 100 % (02/04 2220) Last BM Date : 02/07/22  Weight change: Filed Weights   02/04/22 0500 02/05/22 0500 02/06/22 0500  Weight: 115.4 kg 112 kg 114.3 kg    Intake/Output:   Intake/Output Summary (Last 24 hours) at 02/08/2022 0708 Last data filed at 02/08/2022 0600 Gross per 24 hour  Intake 1170.1 ml  Output 4038 ml  Net -2867.9 ml      Physical Exam  General:  On bipap . No resp difficulty. Appears weak.  HEENT: normal Neck: supple. no JVD. Carotids 2+ bilat; no bruits. No lymphadenopathy or thryomegaly appreciated. Cor: PMI nondisplaced. Regular rate & rhythm. No rubs, gallops. 2/6 TR . Lungs: clear Abdomen: soft, nontender, nondistended. No hepatosplenomegaly. No bruits or masses. Good bowel sounds. Extremities: no cyanosis, clubbing, rash, R and LLE unna boots.  Neuro: alert & orientedx3, cranial nerves grossly intact. moves all 4 extremities w/o difficulty. Affect pleasant  Telemetry  SR 60-90s personally  reviewed.   Labs    CBC Recent Labs    02/05/22 0717 02/06/22 0258 02/07/22 0655 02/08/22 0413 02/08/22 0435  WBC 23.3*   < > 18.7* 19.7*  --   NEUTROABS 17.1*  --   --   --   --   HGB 12.8   < > 12.0 12.6  --   HCT 37.9   < > 36.0 37.5  --   MCV 85.6   < > 86.3 86.6  --   PLT 92*   < > 74* 88* 85*   < > = values in this interval not displayed.   Basic Metabolic Panel Recent Labs    02/05/22 0717 02/05/22 2013 02/06/22 0258 02/07/22 0655 02/08/22 0413  NA 133*   < > 132* 132* 133*  K 3.3*   < > 3.8 3.5 3.8  CL 89*   < > 90* 90* 89*  CO2 32   < > 33* 34* 33*  GLUCOSE 116*   < > 131* 109* 116*  BUN 29*   < > 35* 34* 29*  CREATININE 0.86   < > 1.06* 0.72 0.70  CALCIUM 8.2*   < > 8.2* 8.2* 8.7*  MG 2.2  --  2.2  --   --   PHOS 3.7  --  4.2  --   --    < > = values in this interval not displayed.   Liver Function Tests Recent Labs  02/08/22 0413  AST 30  ALT 15  ALKPHOS 71  BILITOT 0.9  PROT 6.2*  ALBUMIN 2.2*    No results for input(s): "LIPASE", "AMYLASE" in the last 72 hours. Cardiac Enzymes No results for input(s): "CKTOTAL", "CKMB", "CKMBINDEX", "TROPONINI" in the last 72 hours.  BNP: BNP (last 3 results) Recent Labs    07/02/21 2136 01/26/2022 1940  BNP 29.1 449.8*    ProBNP (last 3 results) No results for input(s): "PROBNP" in the last 8760 hours.   D-Dimer Recent Labs    02/08/22 0435  DDIMER 0.50    Hemoglobin A1C No results for input(s): "HGBA1C" in the last 72 hours.  Fasting Lipid Panel No results for input(s): "CHOL", "HDL", "LDLCALC", "TRIG", "CHOLHDL", "LDLDIRECT" in the last 72 hours. Thyroid Function Tests No results for input(s): "TSH", "T4TOTAL", "T3FREE", "THYROIDAB" in the last 72 hours.  Invalid input(s): "FREET3"  Other results:   Imaging    No results found.   Medications:     Scheduled Medications:  Chlorhexidine Gluconate Cloth  6 each Topical Daily   furosemide  80 mg Intravenous BID    gabapentin  600 mg Oral TID   insulin aspart  0-9 Units Subcutaneous Q4H   liver oil-zinc oxide   Topical BID   midodrine  5 mg Oral TID WC   mouth rinse  15 mL Mouth Rinse 4 times per day   pantoprazole  20 mg Oral BID   potassium chloride  40 mEq Oral BID   pravastatin  20 mg Oral Daily   sildenafil  20 mg Oral TID   sodium chloride flush  10-40 mL Intracatheter Q12H   trimethoprim-polymyxin b  1 drop Both Eyes Q6H    Infusions:  sodium chloride Stopped (02/01/22 1154)   sodium chloride 10 mL/hr at 02/08/22 0600   ceFEPime (MAXIPIME) IV Stopped (02/08/22 0118)   Followed by   Derrill Memo ON 02/10/2022]  ceFAZolin (ANCEF) IV     heparin 2,350 Units/hr (02/08/22 0600)   norepinephrine (LEVOPHED) Adult infusion Stopped (02/07/22 0959)   treprostinil (REMODULIN) 35,000,000 ng in pH 12 sterile diluent 100 mL (350,000 ng/mL) infusion 81 ng/kg/min (02/07/22 1400)    PRN Medications: acetaminophen, docusate sodium, Gerhardt's butt cream, ipratropium-albuterol, loperamide, ondansetron (ZOFRAN) IV, mouth rinse, phenol, polyethylene glycol, sodium chloride flush    Patient Profile   [name] is a 62 y.o. female with a hx of morbid obesity, cognitive impairment, U9NA, chronic diastolic HF, pAF (Xarelto), severe pulmonary HTN (on combination therapy with treprostinil and sildenafil) who is being seen today for the evaluation of chronic HF and PH therapies in setting of acute hypoxic respiratory failure requiring CICU admission.   Assessment/Plan   1. Acute on chronic hypoxemic respiratory failure: Baseline OHS/OSA on 4L home oxygen. Currently on Bipap.  She is being treating for community-acquired PNA on a baseline of pulmonary hypertension, OHS/OSA, COPD as well as RV failure.  - Continues on home IV treprostinil and sildenafil.  - She remains very tenuous despite maximum intervention. Sats marginal on Optiflow. Does not want intubation - CCM family meeting 2/3 to discuss possible  transition to comfort care if sats not improving - Volume status difficult to assess. Continue IV lasix 80 mg twice a day. Renal function stable. - Will need RHC 24-48 hours understand hemmodynamics and provide further prognostication .  - Continue midodrine 5 mg tidl  2. Pulmonary hypertension: Suspect mixed group 1, 2, and 3 (OHS/OSA, COPD).   She is followed at [facility].  -  Continue home IV treprostinil.  - Continue sildenafil 20 tid.  - Can consider RHC as above - Given multiple line infections recently may need to reconsider IV trepostinil therapy 3. Acute on chronic cor pulmonale: Echo in 6/23 with EF > 55%, moderate RV dysfunction. Echo this admission with EF 60-65%, D-shaped septum, RV moderately enlarged with moderately decreased systolic function, PASP 59.  - Continue IV lasix.  -  Continue midodrine 5 tid - continue sildenafil for now. May need to stop with SBP < 80.  4. PNA: Suspect community-acquired PNA with PCT 2.02 and WBCs 25.  RLL infiltrate on CXR.  - On cefepime per CCM 5. Atrial fibrillation: Paroxysmal, on Xarelto at home.  Maintaining SR  - Continue heparin gtt for now.  - Platelets 88. Check HIT  6. Elevated Troponin: HS-TnI 371 => 299.  Suspect demand ischemia from hypoxemia and volume overload.  HS-TnI in 1000s range in 2021, cath at that time showed minimal CAD. 7. Bacteremia: BCx from 1/28 + for staph aureus. Initially started on Ancef. Abx changed to cefepime per CCM   -Tunneled PICC removed.  - TTE this admit w/ no clear vegetations  - Would not do TEE (high risk procedure in this setting), per ID will treat like endocarditis regardless - Blood Cx 2/1 + MSSA 8. Conjunctivitis Started Trimethoprim-polymixin  Day 1/5/   8. [place] DNR . May need Palliative Care.    Length of Stay: [place], NP  02/08/2022, 7:08 AM  Advanced Heart Failure Team Pager [phone] (M-F; 7a - 5p)  Please contact [facility] for night-coverage after hours (5p -7a ) and weekends  on [email or web address]  Agree with above. Respiratory status remains very tenuous. On Bipap.   Continues on IV treprostinil. NE off. On IV lasix  General:  Ill appearing. On Bipap HEENT: normal + bipap flushed Neck: supple. JVP hard to see Carotids 2+ bilat; no bruits. No lymphadenopathy or thryomegaly appreciated. Cor: PMI nondisplaced. Regular rate & rhythm. 2/6 TR Lungs: clear Abdomen: obese soft, nontender, nondistended. No hepatosplenomegaly. No bruits or masses. Good bowel sounds. Extremities: no cyanosis, clubbing, rash, 1+ edema + UNNA Neuro: alert & orientedx3, cranial nerves grossly intact. moves all 4 extremities w/o difficulty. Affect pleasant  Remains very tenuous. Continue current treatment. If oxygenation not improving will plan RHC tomorrow to further assess PA pressures and hemodynamics to help guide therapy and gauge prognosis.   CRITICAL CARE Performed by: Glori Bickers  Total critical care time: 35 minutes  Critical care time was exclusive of separately billable procedures and treating other patients.  Critical care was necessary to treat or prevent imminent or life-threatening deterioration.  Critical care was time spent personally by me (independent of midlevel providers or residents) on the following activities: development of treatment plan with patient and/or surrogate as well as nursing, discussions with consultants, evaluation of patient's response to treatment, examination of patient, obtaining history from patient or surrogate, ordering and performing treatments and interventions, ordering and review of laboratory studies, ordering and review of radiographic studies, pulse oximetry and re-evaluation of patient's condition.  Glori Bickers, MD  7:44 AM

## 2022-02-08 NOTE — Progress Notes (Incomplete)
PHARMACY CONSULT NOTE FOR:  OUTPATIENT  PARENTERAL ANTIBIOTIC THERAPY (OPAT)  Indication:  Regimen:  End date:   IV antibiotic discharge orders are pended. To discharging provider:  please sign these orders via discharge navigator,  Select New Orders & click on the button choice - Manage This Unsigned Work.     Thank you for allowing pharmacy to be a part of this patient's care.  Lawson Radar 02/08/2022, 7:57 AM

## 2022-02-08 NOTE — Progress Notes (Signed)
CPT skipped at this time d/t pt position in bed does not allow bed CPT.  RT will restart once bed is in correct position.

## 2022-02-09 ENCOUNTER — Other Ambulatory Visit: Payer: Self-pay | Admitting: Internal Medicine

## 2022-02-09 DIAGNOSIS — R7881 Bacteremia: Secondary | ICD-10-CM | POA: Diagnosis not present

## 2022-02-09 DIAGNOSIS — I270 Primary pulmonary hypertension: Secondary | ICD-10-CM | POA: Diagnosis not present

## 2022-02-09 DIAGNOSIS — Z7189 Other specified counseling: Secondary | ICD-10-CM

## 2022-02-09 DIAGNOSIS — Z515 Encounter for palliative care: Secondary | ICD-10-CM | POA: Diagnosis not present

## 2022-02-09 DIAGNOSIS — J9622 Acute and chronic respiratory failure with hypercapnia: Secondary | ICD-10-CM | POA: Diagnosis not present

## 2022-02-09 DIAGNOSIS — J9621 Acute and chronic respiratory failure with hypoxia: Secondary | ICD-10-CM | POA: Diagnosis not present

## 2022-02-09 DIAGNOSIS — B9561 Methicillin susceptible Staphylococcus aureus infection as the cause of diseases classified elsewhere: Secondary | ICD-10-CM | POA: Diagnosis not present

## 2022-02-09 DIAGNOSIS — I5033 Acute on chronic diastolic (congestive) heart failure: Secondary | ICD-10-CM

## 2022-02-09 DIAGNOSIS — E538 Deficiency of other specified B group vitamins: Secondary | ICD-10-CM

## 2022-02-09 LAB — CULTURE, BLOOD (ROUTINE X 2)
Culture: NO GROWTH
Special Requests: ADEQUATE

## 2022-02-09 LAB — HEPARIN LEVEL (UNFRACTIONATED): Heparin Unfractionated: 0.35 IU/mL (ref 0.30–0.70)

## 2022-02-09 LAB — GLUCOSE, CAPILLARY: Glucose-Capillary: 122 mg/dL — ABNORMAL HIGH (ref 70–99)

## 2022-02-09 LAB — HEPARIN INDUCED PLATELET AB (HIT ANTIBODY): Heparin Induced Plt Ab: 0.205 OD (ref 0.000–0.400)

## 2022-02-09 MED ORDER — DIPHENHYDRAMINE HCL 25 MG PO CAPS
25.0000 mg | ORAL_CAPSULE | Freq: Four times a day (QID) | ORAL | Status: DC | PRN
  Administered 2022-02-09: 25 mg via ORAL
  Filled 2022-02-09: qty 1

## 2022-02-09 NOTE — Progress Notes (Signed)
Pt transported on Bipap 100% BT33 with no complications noted. Pt was placed on HFNC as charted when she arrived to her room on 5W.

## 2022-02-09 NOTE — Progress Notes (Addendum)
Advanced Heart Failure Rounding Note  PCP-Cardiologist: Dr. Haroldine Laws    Subjective:    On abx for suspected aspiration PNA and bacteremia. BCx from 1/28 + for Staph aureus. Echo (02/01/22) with EF 60-65%, D-shaped septum, moderate RV enlargement with moderately decreased systolic function, PASP 59, dilated IVC, no definite valvular vegetation.   1/31 Abx changed  to cefepime.  2/1 Diuresed IV lasix+metolazone+ diamox. Tunneled PICC removed.   2/4 Diuresed with IV lasix and started on midodrine. Norepi weaned off  2/5 Switched to comfort care. Most meds stopped.   On IV treprostinil via PIV + hpearin drip.  On Bipap. Complaining of fatigue.   Objective:   Weight Range: 117.7 kg Body mass index is 45.97 kg/m.   Vital Signs:   Temp:  [98.3 F (36.8 C)] 98.3 F (36.8 C) (02/05 1130) Pulse Rate:  [84-192] 84 (02/06 0758) Resp:  [12-49] 23 (02/06 0758) BP: (78-122)/(43-81) 92/45 (02/06 0758) SpO2:  [75 %-94 %] 91 % (02/06 0758) FiO2 (%):  [100 %] 100 % (02/06 0758) Weight:  [117.7 kg] 117.7 kg (02/06 0500) Last BM Date : 02/08/22  Weight change: Filed Weights   02/05/22 0500 02/06/22 0500 02/09/22 0500  Weight: 112 kg 114.3 kg 117.7 kg    Intake/Output:   Intake/Output Summary (Last 24 hours) at 02/09/2022 0759 Last data filed at 02/09/2022 0600 Gross per 24 hour  Intake 1126.6 ml  Output 2750 ml  Net -1623.4 ml      Physical Exam  General: Appears weak. No resp difficulty HEENT: normal Neck: supple. JVP difficult to assess. . Carotids 2+ bilat; no bruits. No lymphadenopathy or thryomegaly appreciated. Cor: PMI nondisplaced. Regular rate & rhythm. No rubs, gallops . 2/6 TE . Lungs: clear Abdomen: soft, nontender, nondistended. No hepatosplenomegaly. No bruits or masses. Good bowel sounds. Extremities: no cyanosis, clubbing, rash, edema Neuro: alert & orientedx3, cranial nerves grossly intact. moves all 4 extremities w/o difficulty. Affect flat  Telemetry  SR  -ST 80-100s   Labs    CBC Recent Labs    02/07/22 0655 02/08/22 0413 02/08/22 0435  WBC 18.7* 19.7*  --   HGB 12.0 12.6  --   HCT 36.0 37.5  --   MCV 86.3 86.6  --   PLT 74* 88* 85*   Basic Metabolic Panel Recent Labs    02/07/22 0655 02/08/22 0413  NA 132* 133*  K 3.5 3.8  CL 90* 89*  CO2 34* 33*  GLUCOSE 109* 116*  BUN 34* 29*  CREATININE 0.72 0.70  CALCIUM 8.2* 8.7*   Liver Function Tests Recent Labs    02/08/22 0413  AST 30  ALT 15  ALKPHOS 71  BILITOT 0.9  PROT 6.2*  ALBUMIN 2.2*    No results for input(s): "LIPASE", "AMYLASE" in the last 72 hours. Cardiac Enzymes No results for input(s): "CKTOTAL", "CKMB", "CKMBINDEX", "TROPONINI" in the last 72 hours.  BNP: BNP (last 3 results) Recent Labs    07/02/21 2136 01/12/2022 1940  BNP 29.1 449.8*    ProBNP (last 3 results) No results for input(s): "PROBNP" in the last 8760 hours.   D-Dimer Recent Labs    02/08/22 0435  DDIMER 0.50    Hemoglobin A1C No results for input(s): "HGBA1C" in the last 72 hours.  Fasting Lipid Panel No results for input(s): "CHOL", "HDL", "LDLCALC", "TRIG", "CHOLHDL", "LDLDIRECT" in the last 72 hours. Thyroid Function Tests No results for input(s): "TSH", "T4TOTAL", "T3FREE", "THYROIDAB" in the last 72 hours.  Invalid input(s): "  FREET3"  Other results:   Imaging    No results found.   Medications:     Scheduled Medications:  Chlorhexidine Gluconate Cloth  6 each Topical Daily   furosemide  80 mg Intravenous BID   gabapentin  600 mg Oral TID   liver oil-zinc oxide   Topical BID   mouth rinse  15 mL Mouth Rinse 4 times per day   pantoprazole  20 mg Oral BID   potassium chloride  40 mEq Oral BID   sodium chloride flush  10-40 mL Intracatheter Q12H   trimethoprim-polymyxin b  1 drop Both Eyes Q6H    Infusions:  sodium chloride Stopped (02/09/22 0533)   sodium chloride Stopped (02/09/22 0135)   sodium chloride     heparin 2,350 Units/hr (02/09/22  0600)   treprostinil (REMODULIN) 35,000,000 ng in pH 12 sterile diluent 100 mL (350,000 ng/mL) infusion 81 ng/kg/min (02/08/22 1700)    PRN Medications: acetaminophen **OR** acetaminophen, acetaminophen, docusate sodium, Gerhardt's butt cream, glycopyrrolate **OR** glycopyrrolate **OR** glycopyrrolate, ipratropium-albuterol, loperamide, morphine injection, ondansetron (ZOFRAN) IV, mouth rinse, phenol, polyethylene glycol, polyvinyl alcohol, sodium chloride flush    Patient Profile   [name] is a 62 y.o. female with a hx of morbid obesity, cognitive impairment, A4ZY, chronic diastolic HF, pAF (Xarelto), severe pulmonary HTN (on combination therapy with treprostinil and sildenafil) who is being seen today for the evaluation of chronic HF and PH therapies in setting of acute hypoxic respiratory failure requiring CICU admission.   Assessment/Plan   1. Acute on chronic hypoxemic respiratory failure: Baseline OHS/OSA on 4L home oxygen. Currently on Bipap.  She is being treating for community-acquired PNA on a baseline of pulmonary hypertension, OHS/OSA, COPD as well as RV failure.  - Continues on home IV treprostinil  but now comfort care. Anticipate stopping later today.  Continue IV lasix 80 mg twice a day. Renal function stable. 2. Pulmonary hypertension: Suspect mixed group 1, 2, and 3 (OHS/OSA, COPD).   She is [facility] at [facility].  - Continue home IV treprostinil.  - Now off with transition to comfort.  3. Acute on chronic cor pulmonale: Echo in 6/23 with EF > 55%, moderate RV dysfunction. Echo this admission with EF 60-65%, D-shaped septum, RV moderately enlarged with moderately decreased systolic function, PASP 59.  - Continue IV lasix.  - Off midodrine and sildenafil with transition to comfort.   4. PNA: Suspect community-acquired PNA with PCT 2.02 and WBCs 25.  RLL infiltrate on CXR.  - On cefepime per CCM 5. Atrial fibrillation: Paroxysmal, on Xarelto at home.  Maintaining SR  -  Continue heparin gtt for now. Anticipate stopping later today.  6. Elevated Troponin: HS-TnI 371 => 299.  Suspect demand ischemia from hypoxemia and volume overload.  HS-TnI in 1000s range in 2021, cath at that time showed minimal CAD. 7. Bacteremia: BCx from 1/28 + for staph aureus. Initially started on Ancef. Abx changed to cefepime per CCM   -Tunneled PICC removed.  - TTE this admit w/ no clear vegetations  - Would not do TEE (high risk procedure in this setting), per ID will treat like endocarditis regardless - Blood Cx 2/1 + MSSA -Now off antibiotics with transition to comfort care.  8. Conjunctivitis Started Trimethoprim-polymixin  Day 1/5/   8. [place] DNR./DNI CCM discussed. Back on Bipap. Transitioning to comfort care.  CCM stopped stopped most medications.   No labs. Anticipate hospital death.  Length of Stay: [place], NP  02/09/2022, 7:59  AM  Advanced Heart Failure Team Pager [phone] (M-F; 7a - 5p)  Please contact [facility] for night-coverage after hours (5p -7a ) and weekends on [email or web address]  Agree with above.   Now with refractory hypoxia despite all interventions. Family meeting yesterday with decision to move toward comfort care. Currently remains on IV treprostinol   General:  Ill-appearing.On bipap HEENT: normal + bipap flushed Neck: supple. no JVD. Carotids 2+ bilat; no bruits. No lymphadenopathy or thryomegaly appreciated. Cor: PMI nondisplaced. Regular rate & rhythm. 2/6 TR Lungs: coarse Abdomen:  obese soft, nontender, nondistended. No hepatosplenomegaly. No bruits or masses. Good bowel sounds. Extremities: no cyanosis, clubbing, rash, 1+ edema + UNNA Neuro: alert & orientedx3, cranial nerves grossly intact. moves all 4 extremities w/o difficulty. Affect pleasant  She has end-stage PAH with refractory hypoxemia despite max therapies. Decision made to proceed with comfort care. Has been d/w with her and her son. Will continue to wean therapies and  transition to comfort care.  D/w [facility] team.   CRITICAL CARE Performed by: Glori Bickers  Total critical care time: 40 minutes  Critical care time was exclusive of separately billable procedures and treating other patients.  Critical care was necessary to treat or prevent imminent or life-threatening deterioration.  Critical care was time spent personally by me (independent of midlevel providers or residents) on the following activities: development of treatment plan with patient and/or surrogate as well as nursing, discussions with consultants, evaluation of patient's response to treatment, examination of patient, obtaining history from patient or surrogate, ordering and performing treatments and interventions, ordering and review of laboratory studies, ordering and review of radiographic studies, pulse oximetry and re-evaluation of patient's condition.  Glori Bickers, MD  9:01 AM

## 2022-02-09 NOTE — Progress Notes (Signed)
[name] for Heparin (Xarelto on hold) Indication: atrial fibrillation  Allergies  Allergen Reactions   Aspirin Swelling    REACTION: airway swelling   Codeine Other (See Comments)    REACTION: tingling in lips and hard breathing - had reaction at dentist - states "I can't take certain kinds of codeine" - happened maybe 10 yr ago   Lisinopril Cough   Sulfonamide Derivatives Swelling    REACTION: airway swelling   Latex Rash   Patient Measurements: Height: '5\' 3"'$  (160 cm) Weight: 117.7 kg (259 lb 7.7 oz) IBW/kg (Calculated) : 52.4 Heparin dosing wt: 81 kg  Vital Signs: Temp: 97.4 F (36.3 C) (02/06 0810) Temp Source: Axillary (02/06 0810) BP: 92/45 (02/06 0758) Pulse Rate: 84 (02/06 0758)  Labs: Recent Labs    02/07/22 0655 02/08/22 0413 02/08/22 0435 02/09/22 0105  HGB 12.0 12.6  --   --   HCT 36.0 37.5  --   --   PLT 74* 88* 85*  --   APTT  --   --  105*  --   LABPROT  --   --  13.7  --   INR  --   --  1.1  --   HEPARINUNFRC 0.44  --  0.40 0.35  CREATININE 0.72 0.70  --   --      Estimated Creatinine Clearance: 92.7 mL/min (by C-G formula based on SCr of 0.7 mg/dL).   Assessment: 62 y/o F with HF and PAH presents to the ED with shortness breath. She is on Xarelto PTA for afib. Holding Xarelto and starting Heparin. The patient states she hasn't taken her Xarelto in over one week.   Heparin level is therapeutic, planning transition to comfort care later today.  Goal of Therapy:  Heparin level 0.3-0.7 units/ml Monitor platelets by anticoagulation protocol: Yes   Plan:  Continue heparin gtt at 2350 units/hr for now   Arrie Senate, PharmD, BCPS, [facility] Clinical Pharmacist [phone] Please check AMION for all [place] numbers 02/09/2022

## 2022-02-09 NOTE — Progress Notes (Signed)
[facility] Progress Note Patient Name: [name] DOB: 06-18-1960 MRN: [id number]   Date of Service  02/09/2022  HPI/Events of Note  Nursing reports that the patient has had a remodulin pump that has been removed.  Per patient's family and the patient, when patient is off this remodulin it causes "lots of allergy symptoms" & so the prescriber has told her to take benadryl if she's not taking the remodulin. Nursing request for Benadryl order.   eICU Interventions  Plan: Benadryl 25 mg PO !Q 6 hour PRN itching or allergies.      Intervention Category Major Interventions: Other:  Lysle Dingwall 02/09/2022, 9:33 PM

## 2022-02-09 NOTE — Telephone Encounter (Signed)
Patient is admitted and has been transitioned to comfort care with expected in hospital death.

## 2022-02-09 NOTE — Progress Notes (Signed)
Palliative:  HPI: 62 y.o. female  with past medical history of chronic diastolic heart failure, cor pulmonale, severe pulmonary hypertension (on Remodulin and sildenafil), atrial fibrillation on Xarelto, HTN, HLD, CKD stage 3b, OSA aon CPAP, diabetes, GERD admitted on 01/19/2022 with aspiration pneumonia, pulmonary hypertension, CHF exacerbation, MSSA bacteremia initially requiring BiPAP support.    I met today at [name]'s bedside along with her son, [name], and his significant other. [name] is asleep on BiPAP. I spoke with [name] who confirms goals for comfort care and this was discussed with himself and his mother yesterday. We discussed staying away from BiPAP and utilizing medication to keep her comfortable so that she can eat and drink and have the best quality of life. We discussed utilizing medications that will add to her comfort but not medications/interventions that are not making her better or adding to her comfort. [name] agrees with plan.   [name] awoke and I discussed with her comfort. She tells me that she would like to still utilize BiPAP as this is not as uncomfortable for her and we discussed utilizing BiPAP at night but avoiding during the day and using HFNC and medications during the daytime. She agrees with plan. We did not discuss further at this time as she was more interested to brush her teeth and get washed up. Goals for comfort at this time but continuing HFNC as well as nighttime BiPAP as this is currently providing her with some quality.   All questions/concerns addressed. Emotional support provided.   Exam: Alert, oriented. Generalized weakness and fatigue. Poor respiratory reserve. Abd soft.   Plan: - Comfort care - Continue HFNC/nighttime BiPAP as this is currently providing her quality - Anticipate hospital death (unable to transfer to consider hospice due to high oxygen needs)  40 min  Vinie Sill, NP Palliative Medicine Team Pager [phone] (Please  see [email or web address] for schedule) Team Phone [phone]    Greater than 50%  of this time was spent counseling and coordinating care related to the above assessment and plan

## 2022-02-09 NOTE — Progress Notes (Signed)
NAME:  [name], MRN:  [id number], DOB:  1960/12/21, LOS: 9 ADMISSION DATE:  01/11/2022, CONSULTATION DATE:  01/17/2022 REFERRING MD:  Regenia Skeeter - EDP CHIEF COMPLAINT:  Acute hypoxic resp failure; CHF vs. COPD exacerbation   History of Present Illness:  62 year old woman who presented to [facility] 1/28 for respiratory failure. PMHx significant for chronic diastolic HF, cor pulmonale, severe pulmonary hypertension (on Remodulin and sildenafil), A-fib on Xarelto, OSA on CPAP presents to [facility] ED on 5/28 with SOB.  Patient states her breathing has worsened over the past few days.  States she is not very compliant with her medications at home.  States her breathing has gotten a lot more swollen over the past few days making it harder on her breathing.  On 1/28 her breathing was much worse and called EMS.  EMS found patient with sats 72% on room air and initially placed on O2 but required CPAP.  Upon arrival to [facility] ED on 1/28, patient in acute respiratory distress on BiPAP.  Initial BP 99/50.  RR 30s.  Afebrile but wbc 25.4 EKG with no signs of ischemia.  Given 80 IV Lasix. CXR w/ R basilar opacity. Given rocephin/azithro for possible cap.  Cultures obtained.  HF notified by ED physician for consult. PCCM consulted for ICU admission.  Pertinent ED labs: COVID/flu/RSV negative, NA 128, potassium 2.5, mag 1.8, creat 1.03, BNP 449,Troponin 371 troponin 371 then 373, VBG 7.46, 39, 50, 28.  Pertinent Medical History:   Past Medical History:  Diagnosis Date   Acute on chronic diastolic (congestive) heart failure ([place]) 06/25/2013   COPD (chronic obstructive pulmonary disease) (HCC)    Cor pulmonale (HCC)    PA Peak pressure 35mHg   Depression    Diabetes mellitus    well controlled on metformin   External hemorrhoid 01/11/2020   GERD (gastroesophageal reflux disease)    H/O mental retardation    Hyperlipidemia    Hypertension    OSA (obstructive sleep apnea)    CPAP   Pulmonary hypertension (HCC)     Restrictive lung disease    PFTs 06/2012 (FVC 54% predicted and FEV1 68% predicted w minimal bronchodilator response).   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 ulcer ([facility]    chornic, ?followed up at wound care center, multiple courses of antibiotics in past for cellulitis, on lasix   Significant Hospital Events: Including procedures, antibiotic start and stop dates in addition to other pertinent events   1/28 Admitted likely CHF exacerbation on bipap 1/29 Remains on BiPAP, trialed HFNC but desaturated 1/30 Off of BiPAP in AM, tolerated HHFNC all day, fatigued in afternoon with desats 1/31 Off of BiPAP, less reserve today; sats ~85%. CXR with worsening bibasilar airspace disease,  L > R. Abx broadened to cefepime; aggressive pulm toilet/diuresis per AHF team. Net -2.3L. 2/01 Remains on OptiFlow, unable to wean. Ongoing aggressive diuresis. Tunneled line removed at bedside by IR. 2/02 Stable O2 requirement, no significant changes. Remodulin to PIV. 2/3 nearly obtunded, GOC talk then she perked up again 2/5 transition to comfort; see ipal  Interim History / Subjective:  Patient transitioned to comfort care yesterday. States she would like to go to hospice but given high o2 and being on bipap would likely not be able to support this in hospice outpt  This am patient resting w/ bipap Denies any pain   Objective:  Blood pressure (Marland Kitchen  87/43, pulse 92, temperature 98.3 F (36.8 C), temperature source Axillary, resp. rate 12, height '5\' 3"'$  (1.6 m), weight 117.7 kg, last menstrual period 02/19/2011, SpO2 (!) 88 %.    FiO2 (%):  [100 %] 100 %   Intake/Output Summary (Last 24 hours) at 02/09/2022 0717 Last data filed at 02/09/2022 0600 Gross per 24 hour  Intake 1126.6 ml  Output 2750 ml  Net -1623.4 ml    Filed Weights   02/05/22 0500 02/06/22 0500 02/09/22 0500  Weight: 112 kg 114.3 kg 117.7 kg    Physical Examination: General:  ill appearing on bipap HEENT: MM pink/moist; bipap in place Neuro: AO; MAE CV: s1s2, rrr, no m/r/g PULM:  dim clear BS bilaterally; on bipap; rr 30s GI: soft, bsx4 active  Extremities: warm/dry, ble unna boots in place Skin: no rashes or lesions    Assessment & Plan:  Acute hypoxemic resp failure- frail baseline with superimposed decompensated heart failure, possible HCAP Severe pulmonary HTN groups 1/2/3 Decompensated cor pulmonale- unfortunately not really improving PICC line associated MSSA bacteremia- PICC removed 2/1 Afib on AC PTA Thrombocytopenia- timing and trajectory not as c/w HIT, question septic response vs sequestration of end stage heart failure P: -comfort care since 2/5 -okay to use HHFNC and bipap for comfort -prn morphine  Best Practice: (right click and "Reselect all SmartList Selections" daily)   Diet/type: reg DVT prophylaxis: systemic heparin GI prophylaxis: N/A Lines: N/A Foley:  N/A Code Status: DNR Last date of multidisciplinary goals of care discussion [2/5 see separate IPAL note; transition to comfort today]     JD Geryl Rankins Pulmonary [facility] 02/09/2022, 7:17 AM  Please see [email or web address] for pager details.  From 7A-7P if no response, please call [phone]. After hours, please call ELink [phone].

## 2022-02-09 NOTE — Progress Notes (Signed)
Transition to comfort care.  Heart failure team will sign off as of 02/09/22   Willowdean Luhmann NP-C  1:47 PM

## 2022-02-10 ENCOUNTER — Ambulatory Visit: Payer: Medicaid Other | Admitting: Occupational Therapy

## 2022-02-10 DIAGNOSIS — B9561 Methicillin susceptible Staphylococcus aureus infection as the cause of diseases classified elsewhere: Secondary | ICD-10-CM | POA: Diagnosis not present

## 2022-02-10 DIAGNOSIS — J9620 Acute and chronic respiratory failure, unspecified whether with hypoxia or hypercapnia: Secondary | ICD-10-CM

## 2022-02-10 DIAGNOSIS — R7881 Bacteremia: Secondary | ICD-10-CM | POA: Diagnosis not present

## 2022-02-10 DIAGNOSIS — I2729 Other secondary pulmonary hypertension: Secondary | ICD-10-CM | POA: Diagnosis not present

## 2022-02-10 LAB — SEROTONIN RELEASE ASSAY (SRA)
SRA .2 IU/mL UFH Ser-aCnc: <1 % (ref 0–20)
SRA 100IU/mL UFH Ser-aCnc: <1 % (ref 0–20)

## 2022-02-10 LAB — CULTURE, BLOOD (ROUTINE X 2)
Culture: NO GROWTH
Culture: NO GROWTH
Special Requests: ADEQUATE
Special Requests: ADEQUATE

## 2022-02-10 MED ORDER — LORAZEPAM 2 MG/ML IJ SOLN
1.0000 mg | Freq: Four times a day (QID) | INTRAMUSCULAR | Status: DC | PRN
  Administered 2022-02-10: 1 mg via INTRAVENOUS
  Filled 2022-02-10: qty 1

## 2022-02-10 NOTE — Progress Notes (Incomplete)
Palliative:  HPI: 62 y.o. female  with past medical history of CHF, s/p [employer] PPM/ICD, CAD, IDDM, CKD stage 3b, prostatomegaly admitted on 06/03/2021 with weakness x 3 days after fall followed with pain, difficulty to ambulate, decrease intake and urine output. Noted to have hypotension, leukocytosis related to discitis aspiration showing +Klebsiella along with UTI. Hospitalization complicated by cardiogenic shock EF <20%, grade 2 diastolic dysfunction, moderate pulmonary hypertension, renal failure, bladder outlet obstruction, paroxysmal atrial fibrillation. Ongoing significant fluid overload and poor response to diuresis. Trial large dose of Lasix while on dobutamine.   I met again today with [name] along with his daughter and wife at bedside. [name] continues to speak to his poor prognosis and is able to tell his family that he is very much at peace. He reviews that his vital signs have been good but he understands that his heart is "give out" - he says that his heart has served him well for many good years. He is a very spiritual man and reports that he had wonderful visits with chaplain Ellen. He is at peace and is prepared for transition to comfort care when his other daughter returns from [place]. Family at bedside report that they believed she would be back Thurs/Fri but she will not return until Monday July 3rd. [name] was disheartened that she does not return until Monday but they all continue to report their goal to continue to maintain until she returns and then we will transition to full comfort care and allow natural and peaceful death.   I spoke with them again about my recommendation for deactivation of AICD and they all agree that this would be in his best interest at this time. I explained the process and will place order for [employer] to come to deactivate.   All questions/concerns addressed. Emotional support provided.   Exam: Alert, oriented. Frail.  Lying in bed. No distress. Generalized weakness and fatigue. HR 90-100s. Breathing regular, unlabored at rest. Abd soft. BLE edema.   Plan: - DNR - Deactivate AICD - Awaiting daughter's return from vacation to say goodbye and then will transition to full comfort care (she should be back Monday)  40 min  Tuwana Kapaun, NP Palliative Medicine Team Pager [phone] (Please see [email or web address] for schedule) Team Phone [phone]    Greater than 50%  of this time was spent counseling and coordinating care related to the above assessment and plan   

## 2022-02-10 NOTE — Progress Notes (Signed)
Assessing per bipap order, patient on comfort care.

## 2022-02-10 NOTE — Plan of Care (Signed)

## 2022-02-10 NOTE — TOC Progression Note (Signed)
Transition of Care [facility]) - Progression Note    Patient Details  Name: [name] MRN: [id number] Date of Birth: 1960/06/13  Transition of Care [facility]) CM/SW [place], LCSW Phone Number: 02/10/2022, 8:52 AM  Clinical Narrative:    Patient transferred to 5W on comfort care currently. Please consult TOC for any needs.    Expected Discharge Plan: [place] Barriers to Discharge: Continued Medical Work up  Expected Discharge Plan and Services   Discharge Planning Services: CM Consult   Living arrangements for the past 2 months: Apartment                                       Social Determinants of Health (SDOH) Interventions [place]: Food Insecurity Present (11/05/2021)  Housing: Medium Risk (11/05/2021)  Transportation Needs: Unmet Transportation Needs (11/16/2021)  Utilities: Not At Risk (11/05/2021)  Alcohol Screen: Low Risk  (01/08/2021)  Depression (PHQ2-9): Low Risk  (11/05/2021)  Financial Resource Strain: Low Risk  (06/21/2018)  Physical Activity: Insufficiently Active (04/22/2021)  Social Connections: Socially Isolated (11/05/2021)  Stress: No Stress Concern Present (06/21/2018)  Tobacco Use: Low Risk  (02/04/2022)    Readmission Risk Interventions     No data to display

## 2022-02-10 NOTE — Progress Notes (Addendum)
  Patient summary: [name] is a 62 year old person living with severe pulmonary hypertension, chronic hypoxic respiratory failure, chinic right heart failure who was admitted to ICU on 01/05/2022 for CHF exacerbation, CAP aspiration pneumonia and PICC line associated MSSA bacteremia. Despite antibiotics and diureses patient continues have high oxygen requirements and desaturations off Bi-PAP. After goals of care discussions was transitioned to comfort care on 02/08/2022.   Subjective: Patient awake in bed on BiPAP, states she felt well on heated high flow yesterday and would like to be off BiPAP this morning.   Objective:  Vital signs in last 24 hours: Vitals:   02/09/22 1945 02/09/22 2330 02/10/22 0300 02/10/22 0500  BP: 134/76  106/73   Pulse: (!) 112  (!) 134   Resp: 15  (!) 42   Temp: 98.3 F (36.8 C)  98.6 F (37 C)   TempSrc: Oral  Axillary   SpO2: (!) 80% (!) 80% (!) 85%   Weight:    111.7 kg  Height:       Constitutional: Chronically ill appearing. On Bi-PAP Cardiovascular:Normal rate, bilateral lower extermity edema Respiratory:tachypneic, On BiPAP, saturation in low 80s.  GI: Obese, soft, nontender to palpation, Neurological:awake and interactive, moving all extremities Skin: chronic venous statis changes   Assessment/Plan:  Principal Problem:   MSSA bacteremia Active Problems:   Acute on chronic respiratory failure with hypoxia and hypercapnia (HCC)   Acute respiratory failure with hypoxia (HCC)   Hyponatremia   Hypokalemia   Community acquired pneumonia   Acute on chronic right-sided congestive heart failure (HCC)   Bloodstream infection due to central venous catheter   Pulmonary HTN (HCC)   HCAP (healthcare-associated pneumonia)   Goals of care, counseling/discussion   Acute cor pulmonale (HCC)   Primary pulmonary hypertension ([place])   Advanced care planning/counseling discussion  Acute on chronic hypoxemic respiratory failure Severe pulmonary hypertension  with decompensated cor pulmonale\OHS/OSA End stage PAH with refractory hypoxemia. Was treated with IV diuresis and IV treprostinil, now discontinued. Despite maximal therapy continues to have poor respiratory status and desaturations off Bi-PAP. After discussion with palliative care, ICU team, and heart failure team she was transitioned to comfort care with anticipated in hospital death due to significant oxygen requirements.  -Transitioned to comfort care 02/08/2022 - HFNC and BiPAP at night - IV morphine for comfort - palliative care consult - expected in hospital death  PICC associated MSSA bacteremia CAP pneumonia  PICC removed, off antibiotics - transitioned to comfort care  Prior to Admission Living Arrangement: home Anticipated Discharge Location: Anticipated in hospital death/hospice  Barriers to Discharge: Dispo: Anticipated discharge in approximately 0-2 day(s).   Iona Beard, MD 02/10/2022, 7:09 AM Pager: [phone] After 5pm on weekdays and 1pm on weekends: On Call pager [phone]

## 2022-02-11 ENCOUNTER — Ambulatory Visit: Payer: Medicaid Other | Admitting: Occupational Therapy

## 2022-02-12 LAB — CULTURE, BLOOD (ROUTINE X 2)
Culture: NO GROWTH
Culture: NO GROWTH
Special Requests: ADEQUATE
Special Requests: ADEQUATE

## 2022-02-15 ENCOUNTER — Ambulatory Visit: Payer: Medicaid Other | Admitting: Occupational Therapy

## 2022-02-17 ENCOUNTER — Ambulatory Visit: Payer: Medicaid Other | Admitting: Occupational Therapy

## 2022-02-17 ENCOUNTER — Encounter: Payer: Self-pay | Admitting: *Deleted

## 2022-02-17 NOTE — Patient Outreach (Signed)
Care Coordination  02/17/2022  [name] October 08, 1960 [id number]   RNCM performing Case Closure of deceased patient. Care Plan has been completed, episode resolved and RNCM removed from [name] RN, [name] RN Care Coordinator

## 2022-02-18 ENCOUNTER — Encounter: Payer: Medicaid Other | Admitting: Occupational Therapy

## 2022-02-18 ENCOUNTER — Ambulatory Visit: Payer: Medicaid Other | Admitting: Occupational Therapy

## 2022-02-19 ENCOUNTER — Encounter: Payer: Medicaid Other | Admitting: Occupational Therapy

## 2022-02-22 ENCOUNTER — Ambulatory Visit: Payer: Medicaid Other | Admitting: Occupational Therapy

## 2022-02-24 ENCOUNTER — Ambulatory Visit: Payer: Medicaid Other | Admitting: Occupational Therapy

## 2022-02-26 ENCOUNTER — Encounter: Payer: Medicaid Other | Admitting: Occupational Therapy

## 2022-03-01 ENCOUNTER — Encounter: Payer: Medicaid Other | Admitting: Occupational Therapy

## 2022-03-02 ENCOUNTER — Other Ambulatory Visit: Payer: Self-pay | Admitting: Internal Medicine

## 2022-03-02 DIAGNOSIS — I5033 Acute on chronic diastolic (congestive) heart failure: Secondary | ICD-10-CM

## 2022-03-02 DIAGNOSIS — E538 Deficiency of other specified B group vitamins: Secondary | ICD-10-CM

## 2022-03-03 ENCOUNTER — Encounter: Payer: Medicaid Other | Admitting: Occupational Therapy

## 2022-03-05 ENCOUNTER — Encounter: Payer: Medicaid Other | Admitting: Occupational Therapy

## 2022-03-05 NOTE — Death Summary Note (Signed)
  Name: [name] MRN: [id number] DOB: 1960/08/08 62 y.o.  Date of Admission: 01/04/2022  7:24 PM Date of Discharge: 03-05-2022 Attending Physician: Dr. Evette Doffing  Discharge Diagnosis: Principal Problem:   MSSA bacteremia Active Problems:   Acute on chronic respiratory failure with hypoxia and hypercapnia (HCC)   Acute respiratory failure with hypoxia (HCC)   Hyponatremia   Hypokalemia   Community acquired pneumonia   Acute on chronic right-sided congestive heart failure ([place])   Bloodstream infection due to central venous catheter   Pulmonary HTN (HCC)   HCAP (healthcare-associated pneumonia)   Goals of care, counseling/discussion   Acute cor pulmonale (HCC)   Primary pulmonary hypertension ([place])   Advanced care planning/counseling discussion   Cause of death: Hypoxic respiratory failure secondary to terminal pulmonary hypertension   Time of death: 0143   Disposition and follow-up:   [name] was discharged from [facility] in expired condition.    Hospital Course: [name] lived with severe pulmonary hypertension, chronic hypoxic respiratory failure, and chronic right heart failure and was admitted to ICU on 01/05/2022 for CHF exacerbation, CAP aspiration pneumonia and PICC line associated MSSA bacteremia. She has been chronically ill for years and it is likely that the PICC-associated bacteremia put her in this current state. Her terminal pulmonary hypertension had been refractory to advanced therapies and while in the ICU, the patient noted that she wanted to focus on her comfort in her last days, and ultimately transitioned to comfort care on 2/5. She received prn medications, such as morphine, for pain and air hunger, and passed away at 0143 on 03/05/2022. Family was called.   SignedDorethea Clan, DO 03-05-22, 9:39 AM

## 2022-03-05 NOTE — Progress Notes (Incomplete)
                 Interval history ***  Physical exam Blood pressure 115/70, pulse (!) 220, temperature 99 F (37.2 C), temperature source Oral, resp. rate (!) 26, height '5\' 3"'$  (1.6 m), weight 111.7 kg, last menstrual period 02/19/2011, SpO2 (!) 51 %.  ***  Weight change: 0 kg  No intake or output data in the 24 hours ending 2022/03/08 0643 Net IO Since Admission: -11,069.52 mL [08-Mar-2022 0643]  Labs, images, and other studies ***  Assessment and plan Hospital day 11  [name] is a 62 y.o. ***  Principal Problem:   MSSA bacteremia Active Problems:   Acute on chronic respiratory failure with hypoxia and hypercapnia (HCC)   Acute respiratory failure with hypoxia (HCC)   Hyponatremia   Hypokalemia   Community acquired pneumonia   Acute on chronic right-sided congestive heart failure ([place])   Bloodstream infection due to central venous catheter   Pulmonary HTN ([place])   HCAP (healthcare-associated pneumonia)   Goals of care, counseling/discussion   Acute cor pulmonale ([place])   Primary pulmonary hypertension ([place])   Advanced care planning/counseling discussion   Diet: *** IVF: *** VTE: *** Code: *** PT/OT recommendations: *** TOC recommendations: *** Family Update: ***  Discharge plan: Nani Gasser MD Mar 08, 2022, 6:43 AM  Pager: [phone] After 5pm on weekdays and 1pm on weekends: [phone]

## 2022-03-05 DEATH — deceased

## 2022-03-08 ENCOUNTER — Encounter: Payer: Medicaid Other | Admitting: Occupational Therapy

## 2022-03-10 ENCOUNTER — Encounter: Payer: Medicaid Other | Admitting: Occupational Therapy

## 2022-03-12 ENCOUNTER — Encounter: Payer: Medicaid Other | Admitting: Occupational Therapy

## 2022-03-15 ENCOUNTER — Encounter: Payer: Medicaid Other | Admitting: Occupational Therapy

## 2022-03-17 ENCOUNTER — Encounter: Payer: Medicaid Other | Admitting: Occupational Therapy

## 2022-03-19 ENCOUNTER — Encounter: Payer: Medicaid Other | Admitting: Occupational Therapy

## 2022-03-22 ENCOUNTER — Encounter: Payer: Medicaid Other | Admitting: Occupational Therapy

## 2022-03-24 ENCOUNTER — Encounter: Payer: Medicaid Other | Admitting: Occupational Therapy

## 2022-03-26 ENCOUNTER — Encounter: Payer: Medicaid Other | Admitting: Occupational Therapy

## 2022-03-29 ENCOUNTER — Encounter: Payer: Medicaid Other | Admitting: Occupational Therapy

## 2022-03-31 ENCOUNTER — Encounter: Payer: Medicaid Other | Admitting: Occupational Therapy

## 2022-04-02 ENCOUNTER — Encounter: Payer: Medicaid Other | Admitting: Occupational Therapy

## 2022-04-05 ENCOUNTER — Encounter: Payer: Medicaid Other | Admitting: Occupational Therapy

## 2022-04-07 ENCOUNTER — Encounter: Payer: Medicaid Other | Admitting: Occupational Therapy

## 2022-04-09 ENCOUNTER — Encounter: Payer: Medicaid Other | Admitting: Occupational Therapy

## 2022-04-12 ENCOUNTER — Encounter: Payer: Medicaid Other | Admitting: Occupational Therapy

## 2022-04-14 ENCOUNTER — Encounter: Payer: Medicaid Other | Admitting: Occupational Therapy

## 2022-04-16 ENCOUNTER — Encounter: Payer: Medicaid Other | Admitting: Occupational Therapy

## 2022-04-19 ENCOUNTER — Encounter: Payer: Medicaid Other | Admitting: Occupational Therapy

## 2022-04-21 ENCOUNTER — Encounter: Payer: Medicaid Other | Admitting: Occupational Therapy

## 2022-04-23 ENCOUNTER — Encounter: Payer: Medicaid Other | Admitting: Occupational Therapy

## 2022-04-26 ENCOUNTER — Encounter: Payer: Medicaid Other | Admitting: Occupational Therapy

## 2022-04-28 ENCOUNTER — Encounter: Payer: Medicaid Other | Admitting: Occupational Therapy

## 2022-04-30 ENCOUNTER — Encounter: Payer: Medicaid Other | Admitting: Occupational Therapy

## 2022-05-03 ENCOUNTER — Encounter: Payer: Medicaid Other | Admitting: Occupational Therapy

## 2022-05-05 ENCOUNTER — Encounter: Payer: Medicaid Other | Admitting: Occupational Therapy

## 2022-05-07 ENCOUNTER — Encounter: Payer: Medicaid Other | Admitting: Occupational Therapy

## 2022-05-10 ENCOUNTER — Encounter: Payer: Medicaid Other | Admitting: Occupational Therapy

## 2022-05-12 ENCOUNTER — Encounter: Payer: Medicaid Other | Admitting: Occupational Therapy

## 2022-05-14 ENCOUNTER — Encounter: Payer: Medicaid Other | Admitting: Occupational Therapy

## 2022-05-17 ENCOUNTER — Encounter: Payer: Medicaid Other | Admitting: Occupational Therapy

## 2022-05-19 ENCOUNTER — Encounter: Payer: Medicaid Other | Admitting: Occupational Therapy

## 2022-05-21 ENCOUNTER — Encounter: Payer: Medicaid Other | Admitting: Occupational Therapy

## 2022-05-24 ENCOUNTER — Encounter: Payer: Medicaid Other | Admitting: Occupational Therapy

## 2022-05-26 ENCOUNTER — Encounter: Payer: Medicaid Other | Admitting: Occupational Therapy

## 2022-05-28 ENCOUNTER — Encounter: Payer: Medicaid Other | Admitting: Occupational Therapy

## 2022-06-02 ENCOUNTER — Encounter: Payer: Medicaid Other | Admitting: Occupational Therapy

## 2022-06-04 ENCOUNTER — Encounter: Payer: Medicaid Other | Admitting: Occupational Therapy

## 2022-06-07 ENCOUNTER — Encounter: Payer: Medicaid Other | Admitting: Occupational Therapy

## 2022-06-09 ENCOUNTER — Encounter: Payer: Medicaid Other | Admitting: Occupational Therapy

## 2022-06-11 ENCOUNTER — Encounter: Payer: Medicaid Other | Admitting: Occupational Therapy

## 2022-06-14 ENCOUNTER — Encounter: Payer: Medicaid Other | Admitting: Occupational Therapy

## 2022-06-16 ENCOUNTER — Encounter: Payer: Medicaid Other | Admitting: Occupational Therapy

## 2022-06-18 ENCOUNTER — Encounter: Payer: Medicaid Other | Admitting: Occupational Therapy

## 2023-01-24 NOTE — Telephone Encounter (Signed)
 error
